# Patient Record
Sex: Female | Born: 1960 | Race: White | Hispanic: No | Smoking: Former smoker
Health system: Southern US, Community
[De-identification: ages and names within clinical notes are randomized; demographics above are authoritative.]

## PROBLEM LIST (undated history)

## (undated) DIAGNOSIS — C801 Malignant (primary) neoplasm, unspecified: Secondary | ICD-10-CM

## (undated) DIAGNOSIS — C4491 Basal cell carcinoma of skin, unspecified: Secondary | ICD-10-CM

## (undated) DIAGNOSIS — J45909 Unspecified asthma, uncomplicated: Secondary | ICD-10-CM

## (undated) DIAGNOSIS — K589 Irritable bowel syndrome without diarrhea: Secondary | ICD-10-CM

## (undated) DIAGNOSIS — T8859XA Other complications of anesthesia, initial encounter: Secondary | ICD-10-CM

## (undated) DIAGNOSIS — M797 Fibromyalgia: Secondary | ICD-10-CM

## (undated) DIAGNOSIS — R011 Cardiac murmur, unspecified: Secondary | ICD-10-CM

## (undated) DIAGNOSIS — K635 Polyp of colon: Secondary | ICD-10-CM

## (undated) DIAGNOSIS — M1990 Unspecified osteoarthritis, unspecified site: Secondary | ICD-10-CM

## (undated) DIAGNOSIS — G629 Polyneuropathy, unspecified: Secondary | ICD-10-CM

## (undated) DIAGNOSIS — T4145XA Adverse effect of unspecified anesthetic, initial encounter: Secondary | ICD-10-CM

## (undated) DIAGNOSIS — H9192 Unspecified hearing loss, left ear: Secondary | ICD-10-CM

## (undated) DIAGNOSIS — C8590 Non-Hodgkin lymphoma, unspecified, unspecified site: Secondary | ICD-10-CM

## (undated) DIAGNOSIS — D472 Monoclonal gammopathy: Secondary | ICD-10-CM

## (undated) DIAGNOSIS — M8580 Other specified disorders of bone density and structure, unspecified site: Secondary | ICD-10-CM

## (undated) DIAGNOSIS — B977 Papillomavirus as the cause of diseases classified elsewhere: Secondary | ICD-10-CM

## (undated) DIAGNOSIS — G43909 Migraine, unspecified, not intractable, without status migrainosus: Secondary | ICD-10-CM

## (undated) DIAGNOSIS — D649 Anemia, unspecified: Secondary | ICD-10-CM

## (undated) DIAGNOSIS — R896 Abnormal cytological findings in specimens from other organs, systems and tissues: Secondary | ICD-10-CM

## (undated) DIAGNOSIS — F4310 Post-traumatic stress disorder, unspecified: Secondary | ICD-10-CM

## (undated) DIAGNOSIS — K219 Gastro-esophageal reflux disease without esophagitis: Secondary | ICD-10-CM

## (undated) DIAGNOSIS — G4700 Insomnia, unspecified: Secondary | ICD-10-CM

## (undated) DIAGNOSIS — F32A Depression, unspecified: Secondary | ICD-10-CM

## (undated) DIAGNOSIS — K649 Unspecified hemorrhoids: Secondary | ICD-10-CM

## (undated) DIAGNOSIS — F3181 Bipolar II disorder: Secondary | ICD-10-CM

## (undated) DIAGNOSIS — F419 Anxiety disorder, unspecified: Secondary | ICD-10-CM

## (undated) DIAGNOSIS — E7800 Pure hypercholesterolemia, unspecified: Secondary | ICD-10-CM

## (undated) DIAGNOSIS — K52832 Lymphocytic colitis: Secondary | ICD-10-CM

## (undated) DIAGNOSIS — F329 Major depressive disorder, single episode, unspecified: Secondary | ICD-10-CM

## (undated) HISTORY — DX: Gastro-esophageal reflux disease without esophagitis: K21.9

## (undated) HISTORY — DX: Insomnia, unspecified: G47.00

## (undated) HISTORY — DX: Anemia, unspecified: D64.9

## (undated) HISTORY — DX: Monoclonal gammopathy: D47.2

## (undated) HISTORY — DX: Lymphocytic colitis: K52.832

## (undated) HISTORY — DX: Non-Hodgkin lymphoma, unspecified, unspecified site: C85.90

## (undated) HISTORY — DX: Migraine, unspecified, not intractable, without status migrainosus: G43.909

## (undated) HISTORY — PX: COLONOSCOPY: SHX174

## (undated) HISTORY — PX: BREAST BIOPSY: SHX20

## (undated) HISTORY — DX: Anxiety disorder, unspecified: F41.9

## (undated) HISTORY — DX: Abnormal cytological findings in specimens from other organs, systems and tissues: R89.6

## (undated) HISTORY — DX: Unspecified osteoarthritis, unspecified site: M19.90

## (undated) HISTORY — DX: Fibromyalgia: M79.7

## (undated) HISTORY — DX: Basal cell carcinoma of skin, unspecified: C44.91

## (undated) HISTORY — DX: Other specified disorders of bone density and structure, unspecified site: M85.80

## (undated) HISTORY — PX: PELVIC LAPAROSCOPY: SHX162

## (undated) HISTORY — DX: Irritable bowel syndrome, unspecified: K58.9

## (undated) HISTORY — PX: ESOPHAGOGASTRODUODENOSCOPY: SHX1529

## (undated) HISTORY — DX: Papillomavirus as the cause of diseases classified elsewhere: B97.7

## (undated) HISTORY — DX: Pure hypercholesterolemia, unspecified: E78.00

## (undated) HISTORY — DX: Polyneuropathy, unspecified: G62.9

## (undated) HISTORY — DX: Unspecified hearing loss, left ear: H91.92

## (undated) HISTORY — DX: Polyp of colon: K63.5

## (undated) HISTORY — DX: Post-traumatic stress disorder, unspecified: F43.10

## (undated) HISTORY — DX: Bipolar II disorder: F31.81

## (undated) HISTORY — PX: DILATION AND CURETTAGE OF UTERUS: SHX78

## (undated) HISTORY — PX: SHOULDER SURGERY: SHX246

---

## 1991-02-28 HISTORY — PX: HEMORRHOID SURGERY: SHX153

## 1998-06-28 ENCOUNTER — Other Ambulatory Visit: Admission: RE | Admit: 1998-06-28 | Discharge: 1998-06-28 | Payer: Self-pay | Admitting: Obstetrics and Gynecology

## 1999-07-05 ENCOUNTER — Other Ambulatory Visit: Admission: RE | Admit: 1999-07-05 | Discharge: 1999-07-05 | Payer: Self-pay | Admitting: Gynecology

## 2000-07-06 ENCOUNTER — Other Ambulatory Visit: Admission: RE | Admit: 2000-07-06 | Discharge: 2000-07-06 | Payer: Self-pay | Admitting: Gynecology

## 2000-09-21 ENCOUNTER — Emergency Department (HOSPITAL_COMMUNITY): Admission: EM | Admit: 2000-09-21 | Discharge: 2000-09-21 | Payer: Self-pay | Admitting: Emergency Medicine

## 2000-09-21 ENCOUNTER — Encounter: Payer: Self-pay | Admitting: Emergency Medicine

## 2001-01-11 ENCOUNTER — Encounter: Payer: Self-pay | Admitting: Gastroenterology

## 2001-01-11 ENCOUNTER — Encounter: Admission: RE | Admit: 2001-01-11 | Discharge: 2001-01-11 | Payer: Self-pay | Admitting: Gastroenterology

## 2001-01-14 ENCOUNTER — Encounter: Admission: RE | Admit: 2001-01-14 | Discharge: 2001-01-14 | Payer: Self-pay | Admitting: Gastroenterology

## 2001-01-14 ENCOUNTER — Encounter: Payer: Self-pay | Admitting: Gastroenterology

## 2001-07-23 ENCOUNTER — Other Ambulatory Visit: Admission: RE | Admit: 2001-07-23 | Discharge: 2001-07-23 | Payer: Self-pay | Admitting: Gynecology

## 2001-09-11 ENCOUNTER — Encounter: Admission: RE | Admit: 2001-09-11 | Discharge: 2001-09-11 | Payer: Self-pay | Admitting: Family Medicine

## 2001-09-11 ENCOUNTER — Encounter: Payer: Self-pay | Admitting: Family Medicine

## 2002-01-02 ENCOUNTER — Other Ambulatory Visit: Admission: RE | Admit: 2002-01-02 | Discharge: 2002-01-02 | Payer: Self-pay | Admitting: Gynecology

## 2002-12-23 ENCOUNTER — Other Ambulatory Visit: Admission: RE | Admit: 2002-12-23 | Discharge: 2002-12-23 | Payer: Self-pay | Admitting: Family Medicine

## 2003-01-06 ENCOUNTER — Encounter: Admission: RE | Admit: 2003-01-06 | Discharge: 2003-04-06 | Payer: Self-pay | Admitting: Family Medicine

## 2003-01-30 ENCOUNTER — Encounter: Admission: RE | Admit: 2003-01-30 | Discharge: 2003-01-30 | Payer: Self-pay | Admitting: Family Medicine

## 2003-04-05 ENCOUNTER — Encounter: Admission: RE | Admit: 2003-04-05 | Discharge: 2003-04-05 | Payer: Self-pay | Admitting: Family Medicine

## 2003-06-28 ENCOUNTER — Emergency Department (HOSPITAL_COMMUNITY): Admission: EM | Admit: 2003-06-28 | Discharge: 2003-06-28 | Payer: Self-pay | Admitting: Emergency Medicine

## 2003-08-18 ENCOUNTER — Encounter: Admission: RE | Admit: 2003-08-18 | Discharge: 2003-10-12 | Payer: Self-pay | Admitting: Family Medicine

## 2003-09-21 ENCOUNTER — Encounter: Admission: RE | Admit: 2003-09-21 | Discharge: 2003-09-21 | Payer: Self-pay | Admitting: Neurological Surgery

## 2003-12-07 ENCOUNTER — Encounter: Admission: RE | Admit: 2003-12-07 | Discharge: 2003-12-07 | Payer: Self-pay | Admitting: Neurological Surgery

## 2003-12-18 ENCOUNTER — Other Ambulatory Visit: Admission: RE | Admit: 2003-12-18 | Discharge: 2003-12-18 | Payer: Self-pay | Admitting: Gynecology

## 2004-02-02 ENCOUNTER — Encounter: Admission: RE | Admit: 2004-02-02 | Discharge: 2004-02-02 | Payer: Self-pay | Admitting: Family Medicine

## 2004-03-08 ENCOUNTER — Encounter: Admission: RE | Admit: 2004-03-08 | Discharge: 2004-03-08 | Payer: Self-pay | Admitting: Neurological Surgery

## 2004-04-09 ENCOUNTER — Emergency Department (HOSPITAL_COMMUNITY): Admission: EM | Admit: 2004-04-09 | Discharge: 2004-04-09 | Payer: Self-pay | Admitting: Emergency Medicine

## 2004-04-13 ENCOUNTER — Encounter: Admission: RE | Admit: 2004-04-13 | Discharge: 2004-04-13 | Payer: Self-pay | Admitting: Neurological Surgery

## 2005-03-26 ENCOUNTER — Encounter: Admission: RE | Admit: 2005-03-26 | Discharge: 2005-03-26 | Payer: Self-pay | Admitting: Orthopedic Surgery

## 2005-04-13 ENCOUNTER — Encounter: Admission: RE | Admit: 2005-04-13 | Discharge: 2005-07-12 | Payer: Self-pay | Admitting: Orthopedic Surgery

## 2005-05-05 ENCOUNTER — Other Ambulatory Visit: Admission: RE | Admit: 2005-05-05 | Discharge: 2005-05-05 | Payer: Self-pay | Admitting: Family Medicine

## 2005-05-06 DIAGNOSIS — IMO0001 Reserved for inherently not codable concepts without codable children: Secondary | ICD-10-CM

## 2005-05-06 HISTORY — DX: Reserved for inherently not codable concepts without codable children: IMO0001

## 2005-07-13 ENCOUNTER — Encounter: Admission: RE | Admit: 2005-07-13 | Discharge: 2005-08-03 | Payer: Self-pay | Admitting: Orthopedic Surgery

## 2005-11-10 ENCOUNTER — Encounter: Admission: RE | Admit: 2005-11-10 | Discharge: 2005-11-10 | Payer: Self-pay | Admitting: Family Medicine

## 2005-11-29 ENCOUNTER — Other Ambulatory Visit: Admission: RE | Admit: 2005-11-29 | Discharge: 2005-11-29 | Payer: Self-pay | Admitting: Family Medicine

## 2006-02-20 ENCOUNTER — Emergency Department (HOSPITAL_COMMUNITY): Admission: EM | Admit: 2006-02-20 | Discharge: 2006-02-20 | Payer: Self-pay | Admitting: Emergency Medicine

## 2006-09-11 ENCOUNTER — Encounter: Admission: RE | Admit: 2006-09-11 | Discharge: 2006-09-11 | Payer: Self-pay | Admitting: Family Medicine

## 2006-10-03 ENCOUNTER — Other Ambulatory Visit: Admission: RE | Admit: 2006-10-03 | Discharge: 2006-10-03 | Payer: Self-pay | Admitting: Gynecology

## 2006-11-13 ENCOUNTER — Encounter: Admission: RE | Admit: 2006-11-13 | Discharge: 2006-11-13 | Payer: Self-pay | Admitting: Family Medicine

## 2006-11-15 ENCOUNTER — Encounter: Admission: RE | Admit: 2006-11-15 | Discharge: 2006-11-15 | Payer: Self-pay | Admitting: Family Medicine

## 2007-06-11 ENCOUNTER — Encounter: Admission: RE | Admit: 2007-06-11 | Discharge: 2007-06-11 | Payer: Self-pay | Admitting: Family Medicine

## 2007-08-02 ENCOUNTER — Encounter: Admission: RE | Admit: 2007-08-02 | Discharge: 2007-08-02 | Payer: Self-pay | Admitting: Family Medicine

## 2007-08-28 ENCOUNTER — Encounter: Admission: RE | Admit: 2007-08-28 | Discharge: 2007-10-16 | Payer: Self-pay | Admitting: Neurological Surgery

## 2007-09-23 ENCOUNTER — Encounter: Admission: RE | Admit: 2007-09-23 | Discharge: 2007-09-23 | Payer: Self-pay | Admitting: Neurological Surgery

## 2008-02-28 HISTORY — PX: SPINE SURGERY: SHX786

## 2008-03-11 ENCOUNTER — Other Ambulatory Visit: Admission: RE | Admit: 2008-03-11 | Discharge: 2008-03-11 | Payer: Self-pay | Admitting: Family Medicine

## 2008-04-02 ENCOUNTER — Emergency Department (HOSPITAL_COMMUNITY): Admission: EM | Admit: 2008-04-02 | Discharge: 2008-04-02 | Payer: Self-pay | Admitting: Emergency Medicine

## 2008-05-14 ENCOUNTER — Ambulatory Visit: Payer: Self-pay | Admitting: Gynecology

## 2008-06-11 ENCOUNTER — Ambulatory Visit (HOSPITAL_COMMUNITY): Admission: RE | Admit: 2008-06-11 | Discharge: 2008-06-11 | Payer: Self-pay | Admitting: Gastroenterology

## 2008-08-20 ENCOUNTER — Emergency Department (HOSPITAL_COMMUNITY): Admission: EM | Admit: 2008-08-20 | Discharge: 2008-08-20 | Payer: Self-pay | Admitting: Emergency Medicine

## 2008-10-15 ENCOUNTER — Ambulatory Visit: Payer: Self-pay | Admitting: Gynecology

## 2008-10-19 ENCOUNTER — Ambulatory Visit: Payer: Self-pay | Admitting: Gynecology

## 2008-10-19 ENCOUNTER — Encounter: Payer: Self-pay | Admitting: Gynecology

## 2008-12-08 ENCOUNTER — Emergency Department (HOSPITAL_COMMUNITY): Admission: EM | Admit: 2008-12-08 | Discharge: 2008-12-08 | Payer: Self-pay | Admitting: Emergency Medicine

## 2008-12-17 ENCOUNTER — Encounter: Admission: RE | Admit: 2008-12-17 | Discharge: 2008-12-17 | Payer: Self-pay | Admitting: Neurological Surgery

## 2008-12-25 ENCOUNTER — Ambulatory Visit (HOSPITAL_COMMUNITY): Admission: RE | Admit: 2008-12-25 | Discharge: 2008-12-26 | Payer: Self-pay | Admitting: Neurological Surgery

## 2009-01-19 ENCOUNTER — Encounter: Admission: RE | Admit: 2009-01-19 | Discharge: 2009-01-19 | Payer: Self-pay | Admitting: Neurological Surgery

## 2009-03-10 ENCOUNTER — Ambulatory Visit: Payer: Self-pay | Admitting: Gynecology

## 2009-03-13 ENCOUNTER — Emergency Department (HOSPITAL_COMMUNITY): Admission: EM | Admit: 2009-03-13 | Discharge: 2009-03-14 | Payer: Self-pay | Admitting: Emergency Medicine

## 2009-03-22 ENCOUNTER — Encounter: Admission: RE | Admit: 2009-03-22 | Discharge: 2009-03-22 | Payer: Self-pay | Admitting: Neurological Surgery

## 2009-06-07 ENCOUNTER — Ambulatory Visit: Payer: Self-pay | Admitting: Gynecology

## 2009-06-07 ENCOUNTER — Other Ambulatory Visit: Admission: RE | Admit: 2009-06-07 | Discharge: 2009-06-07 | Payer: Self-pay | Admitting: Gynecology

## 2009-06-21 ENCOUNTER — Encounter: Admission: RE | Admit: 2009-06-21 | Discharge: 2009-06-21 | Payer: Self-pay | Admitting: Neurological Surgery

## 2009-10-05 ENCOUNTER — Encounter: Admission: RE | Admit: 2009-10-05 | Discharge: 2009-10-05 | Payer: Self-pay | Admitting: Neurological Surgery

## 2009-10-20 ENCOUNTER — Encounter: Admission: RE | Admit: 2009-10-20 | Discharge: 2009-10-20 | Payer: Self-pay | Admitting: Orthopedic Surgery

## 2009-10-26 ENCOUNTER — Ambulatory Visit (HOSPITAL_COMMUNITY): Admission: RE | Admit: 2009-10-26 | Discharge: 2009-10-26 | Payer: Self-pay | Admitting: Orthopedic Surgery

## 2009-10-28 ENCOUNTER — Ambulatory Visit: Payer: Self-pay | Admitting: Gynecology

## 2010-01-04 ENCOUNTER — Ambulatory Visit: Payer: Self-pay | Admitting: Gynecology

## 2010-02-01 ENCOUNTER — Ambulatory Visit: Payer: Self-pay | Admitting: Gynecology

## 2010-03-17 ENCOUNTER — Emergency Department (HOSPITAL_COMMUNITY)
Admission: EM | Admit: 2010-03-17 | Discharge: 2010-03-17 | Payer: Self-pay | Source: Home / Self Care | Admitting: Emergency Medicine

## 2010-03-21 ENCOUNTER — Encounter: Payer: Self-pay | Admitting: Gynecology

## 2010-05-14 LAB — URINALYSIS, ROUTINE W REFLEX MICROSCOPIC
Bilirubin Urine: NEGATIVE
Glucose, UA: NEGATIVE mg/dL
Ketones, ur: NEGATIVE mg/dL
Nitrite: POSITIVE — AB
Protein, ur: NEGATIVE mg/dL
Specific Gravity, Urine: 1.01 (ref 1.005–1.030)
Urobilinogen, UA: 1 mg/dL (ref 0.0–1.0)
pH: 6.5 (ref 5.0–8.0)

## 2010-05-14 LAB — URINE MICROSCOPIC-ADD ON

## 2010-05-14 LAB — PREGNANCY, URINE: Preg Test, Ur: NEGATIVE

## 2010-06-02 LAB — CBC
HCT: 36.3 % (ref 36.0–46.0)
Hemoglobin: 12.2 g/dL (ref 12.0–15.0)
MCHC: 33.7 g/dL (ref 30.0–36.0)
MCV: 88.1 fL (ref 78.0–100.0)
Platelets: 283 10*3/uL (ref 150–400)
RBC: 4.13 MIL/uL (ref 3.87–5.11)
RDW: 15 % (ref 11.5–15.5)
WBC: 4.8 10*3/uL (ref 4.0–10.5)

## 2010-06-02 LAB — COMPREHENSIVE METABOLIC PANEL
ALT: 27 U/L (ref 0–35)
AST: 21 U/L (ref 0–37)
Albumin: 4.4 g/dL (ref 3.5–5.2)
Alkaline Phosphatase: 38 U/L — ABNORMAL LOW (ref 39–117)
BUN: 11 mg/dL (ref 6–23)
CO2: 26 mEq/L (ref 19–32)
Calcium: 8.9 mg/dL (ref 8.4–10.5)
Chloride: 105 mEq/L (ref 96–112)
Creatinine, Ser: 0.68 mg/dL (ref 0.4–1.2)
GFR calc Af Amer: >60 mL/min (ref >60)
GFR calc non Af Amer: >60 mL/min (ref >60)
Glucose, Bld: 106 mg/dL — ABNORMAL HIGH (ref 70–99)
Potassium: 3.7 mEq/L (ref 3.5–5.1)
Sodium: 138 mEq/L (ref 135–145)
Total Bilirubin: 0.8 mg/dL (ref 0.3–1.2)
Total Protein: 7 g/dL (ref 6.0–8.3)

## 2010-06-02 LAB — PROTIME-INR
INR: 1.09 (ref 0.00–1.49)
Prothrombin Time: 14 seconds (ref 11.6–15.2)

## 2010-06-02 LAB — HCG, SERUM, QUALITATIVE: Preg, Serum: NEGATIVE

## 2010-06-02 LAB — APTT: aPTT: 28 seconds (ref 24–37)

## 2010-08-04 ENCOUNTER — Ambulatory Visit: Payer: Medicare Other | Attending: Neurological Surgery

## 2010-08-04 DIAGNOSIS — IMO0001 Reserved for inherently not codable concepts without codable children: Secondary | ICD-10-CM | POA: Insufficient documentation

## 2010-08-04 DIAGNOSIS — M25519 Pain in unspecified shoulder: Secondary | ICD-10-CM | POA: Insufficient documentation

## 2010-08-04 DIAGNOSIS — R5381 Other malaise: Secondary | ICD-10-CM | POA: Insufficient documentation

## 2010-08-04 DIAGNOSIS — M25619 Stiffness of unspecified shoulder, not elsewhere classified: Secondary | ICD-10-CM | POA: Insufficient documentation

## 2010-08-04 DIAGNOSIS — M542 Cervicalgia: Secondary | ICD-10-CM | POA: Insufficient documentation

## 2010-08-08 ENCOUNTER — Ambulatory Visit: Payer: Medicare Other | Admitting: Physical Therapy

## 2010-08-10 ENCOUNTER — Ambulatory Visit: Payer: Medicare Other

## 2010-08-16 ENCOUNTER — Ambulatory Visit: Payer: Medicare Other

## 2010-08-18 ENCOUNTER — Ambulatory Visit: Payer: Medicare Other

## 2010-08-23 ENCOUNTER — Ambulatory Visit: Payer: Medicare Other

## 2010-08-25 ENCOUNTER — Ambulatory Visit: Payer: Medicare Other

## 2010-08-30 ENCOUNTER — Ambulatory Visit: Payer: Medicare Other | Attending: Neurological Surgery | Admitting: Physical Therapy

## 2010-08-30 DIAGNOSIS — M25619 Stiffness of unspecified shoulder, not elsewhere classified: Secondary | ICD-10-CM | POA: Insufficient documentation

## 2010-08-30 DIAGNOSIS — R5381 Other malaise: Secondary | ICD-10-CM | POA: Insufficient documentation

## 2010-08-30 DIAGNOSIS — IMO0001 Reserved for inherently not codable concepts without codable children: Secondary | ICD-10-CM | POA: Insufficient documentation

## 2010-08-30 DIAGNOSIS — M25519 Pain in unspecified shoulder: Secondary | ICD-10-CM | POA: Insufficient documentation

## 2010-08-30 DIAGNOSIS — M542 Cervicalgia: Secondary | ICD-10-CM | POA: Insufficient documentation

## 2010-09-01 ENCOUNTER — Ambulatory Visit: Payer: Medicare Other

## 2010-09-02 ENCOUNTER — Other Ambulatory Visit: Payer: Self-pay | Admitting: Gastroenterology

## 2010-09-06 ENCOUNTER — Encounter: Payer: Medicare Other | Admitting: Physical Therapy

## 2010-09-08 ENCOUNTER — Ambulatory Visit: Payer: Medicare Other | Admitting: Physical Therapy

## 2010-09-12 ENCOUNTER — Ambulatory Visit: Payer: Medicare Other

## 2010-09-14 ENCOUNTER — Ambulatory Visit: Payer: Medicare Other | Admitting: Physical Therapy

## 2010-09-21 ENCOUNTER — Encounter: Payer: Medicare Other | Admitting: Physical Therapy

## 2010-10-05 ENCOUNTER — Encounter: Payer: Medicare Other | Admitting: Physical Therapy

## 2010-11-11 ENCOUNTER — Ambulatory Visit
Admission: RE | Admit: 2010-11-11 | Discharge: 2010-11-11 | Disposition: A | Discharge status: Home / Self Care | Payer: Medicare Other | Source: Ambulatory Visit | Attending: Gastroenterology | Admitting: Gastroenterology

## 2010-11-11 ENCOUNTER — Other Ambulatory Visit: Payer: Self-pay | Admitting: Gastroenterology

## 2010-11-11 DIAGNOSIS — R1011 Right upper quadrant pain: Secondary | ICD-10-CM

## 2010-11-11 MED ORDER — IOHEXOL 300 MG/ML  SOLN
100.0000 mL | Freq: Once | INTRAMUSCULAR | Status: AC | PRN
  Administered 2010-11-11: 100 mL via INTRAVENOUS

## 2010-11-29 ENCOUNTER — Encounter (INDEPENDENT_AMBULATORY_CARE_PROVIDER_SITE_OTHER): Payer: Self-pay | Admitting: Surgery

## 2010-11-29 ENCOUNTER — Ambulatory Visit (INDEPENDENT_AMBULATORY_CARE_PROVIDER_SITE_OTHER): Payer: Medicare Other | Admitting: Surgery

## 2010-11-29 VITALS — BP 102/68 | HR 80 | Temp 97.4°F | Resp 20 | Ht 62.0 in | Wt 102.4 lb

## 2010-11-29 DIAGNOSIS — K649 Unspecified hemorrhoids: Secondary | ICD-10-CM | POA: Insufficient documentation

## 2010-11-29 DIAGNOSIS — R1012 Left upper quadrant pain: Secondary | ICD-10-CM

## 2010-11-29 NOTE — Patient Instructions (Signed)
HEMORRHOIDS   The rectum is the last few inches of your colon, and it naturally stretches to hold stool.  Hemorrhoidal piles are natural clusters of blood vessels that help the rectum stretch to hold stool and allow bowel movements to eliminate feces.  Hemorrhoids are abnormally swollen blood vessels in the rectum.  Too much pressure in the rectum causes hemorrhoids by forcing blood to stretch and bulge the walls of the veins, sometimes even rupturing them.  Hemorrhoids can become like varicose veins you might see on a person's legs. When bulging hemorrhoidal veins are irritated, they can swell, burn, itch, become very painful, and bleed. Once the rectal veins have been stretched out and hemorrhoids created, they are difficult to get rid of completely and tend to recur with less straining than it took to cause them in the first place. Fortunately, good habits and simple medical treatment usually control hemorrhoids well, and surgery is only recommended in unusually severe cases. Some of the most frequent causes of hemorrhoids:    Constant sitting    Straining with bowel movements (from constipation or hard stools)    Diarrhea    Sitting on the toilet for a long time    Severe coughing    Childbirth    Heavy Lifting  Types of Hemorrhoids:    Internal hemorrhoids usually don't hurt or itch; they are deep inside the rectum and usually have no sensation. However, internal hemorrhoids can bleed.  Such bleeding should not be ignored and mask blood from a dangerous source like colorectal cancer, so persistent rectal bleeding should be investigated with a colonoscopy.    External hemorrhoids cause most of the symptoms - pain, burning, and itching. Unirritated hemorrhoids can look like small skin tags coming out of the anus.     Thrombosed hemorrhoids can form when a hemorrhoid blood vessel bursts and causes the hemorrhoid to swell.  A purple blood clot can form in it and become an excruciatingly painful lump  at the anus. Because of these unpleasant symptoms, immediate incision and drainage by a surgeon at an office visit can provide much relief of the pain.    PREVENTION Avoiding the causes listed in above will prevent most cases of hemorrhoids, but this advice is sometimes hard to follow:  How can you avoid sitting all day if you have a seated job? Also, we try to avoid coughing and diarrhea, but sometimes it's beyond your control.  Still, there are some practical hints to help:    If your main job activity is seated, always stand or walk during your breaks. Make it a point to stand and walk at least 5 minutes every hour and try to shift frequently in your chair to avoid direct rectal pressure.    Always exhale as you strain or lift. Don't hold your breath.    Treat coughing, diarrhea and constipation early since irritated hemorrhoids may soon follow.    Do not delay or try to prevent a bowel movement when the urge is present.   Exercise regularly (walking or jogging 60 minutes a day) to stimulate the bowels to move.   Avoid dry toilet paper when cleaning after bowel movements.  Moistened tissues such as baby wipes are less irritating.  Lightly pat the rectal area dry.  Using irrigating showers or bottle irrigation washing can more gently clean this sensitive area.   Keep the anal and genital area clean and  dry.  Talcum or baby powders can help   GET   YOUR STOOLS SOFT.   This is the most important way to prevent irritated hemorrhoids.  Hard stools are like sandpaper to the anorectal canal and will cause more problems.   The goal: ONE SOFT BOWEL MOVEMENT A DAY!  To have soft, regular bowel movements:    Drink at least 8 tall glasses of water a day.     AVOID CONSTIPATION    Take plenty of fiber.  Fiber is the undigested part of plant food that passes into the colon, acting s "natures broom" to encourage bowel motility and movement.  Fiber can absorb and hold large amounts of water. This results in a  larger, bulkier stool, which is soft and easier to pass. Work gradually over several weeks up to 6 servings a day of fiber (25g a day even more if needed) in the form of: o Vegetables -- Root (potatoes, carrots, turnips), leafy green (lettuce, salad greens, celery, spinach), or cooked high residue (cabbage, broccoli, etc) o Fruit -- Fresh (unpeeled skin & pulp), Dried (prunes, apricots, cherries, etc ),  or stewed ( applesauce)  o Whole grain breads, pasta, etc (whole wheat)  o Bran cereals    Bulking Agents -- This type of water-retaining fiber generally is easily obtained each day by one of the following:  o Psyllium bran -- The psyllium plant is remarkable because its ground seeds can retain so much water. This product is available as Metamucil, Konsyl, Effersyllium, Per Diem Fiber, or the less expensive generic preparation in drug and health food stores. Although labeled a laxative, it really is not a laxative.  o Methylcellulose -- This is another fiber derived from wood which also retains water. It is available as Citrucel. o Polyethylene Glycol - and "artificial" fiber commonly called Miralax or Glycolax.  It is helpful for people with gassy or bloated feelings with regular fiber o Flax Seed - a less gassy fiber than psyllium   No reading or other relaxing activity while on the toilet. If bowel movements take longer than 5 minutes, you are too constipated   Laxatives can be useful for a short period if constipation is severe o Osmotics (Milk of Magnesia, Fleets phosphosoda, Magnesium citrate, MiraLax, GoLytely) are safer than  o Stimulants (Senokot, Castor Oil, Dulcolax, Ex Lax)    o Do not take laxatives for more than 7days in a row.   Laxatives are not a good long-term solution as it can stress the intestine and colon and causes too much mineral and fluid losses.    If badly constipated, try a Bowel Retraining Program: o Do not use laxatives.  o Eat a diet high in roughage, such as bran  cereals and leafy vegetables.  o Drink six (6) ounces of prune or apricot juice each morning.  o Eat two (2) large servings of stewed fruit each day.  o Take one (1) heaping dose of a bulking agent (ex. Metamucil, Citrucel, Miralax) twice a day.  o Use sugar-free sweetener when possible to avoid excessive calories.  o Eat a normal breakfast.  o Set aside 15 minutes after breakfast to sit on the toilet, but do not strain to have a bowel movement.  o If you do not have a bowel movement by the third day, use an enema and repeat the above steps.    AVOID DIARRHEA o Switch to liquids and simpler foods for a few days to avoid stressing your intestines further. o Avoid dairy products (especially milk & ice cream) for a short   time.  The intestines often can lose the ability to digest lactose when stressed. o Avoid foods that cause gassiness or bloating.  Typical foods include beans and other legumes, cabbage, broccoli, and dairy foods.  Every person has some sensitivity to other foods, so listen to our body and avoid those foods that trigger problems for you. o Adding fiber (Citrucel, Metamucil, psyllium, Miralax) gradually can help thicken stools by absorbing excess fluid and retrain the intestines to act more normally.  Slowly increase the dose over a few weeks.  Too much fiber too soon can backfire and cause cramping & bloating. o Probiotics (such as active yogurt, Align, etc) may help repopulate the intestines and colon with normal bacteria and calm down a sensitive digestive tract.  Most studies show it to be of mild help, though, and such products can be costly. o Medicines:   Bismuth subsalicylate (ex. Kayopectate, Pepto Bismol) every 30 minutes for up to 6 doses can help control diarrhea.  Avoid if pregnant.   Loperamide (Immodium) can slow down diarrhea.  Start with two tablets (4mg total) first and then try one tablet every 6 hours.  Avoid if you are having fevers or severe pain.  If you are not  better or start feeling worse, stop all medicines and call your doctor for advice o Call your doctor if you are getting worse or not better.  Sometimes further testing (cultures, endoscopy, X-ray studies, bloodwork, etc) may be needed to help diagnose and treat the cause of the diarrhea.   If these preventive measures fail, you must take action right away! Hemorrhoids are one condition that can be mild in the morning and become intolerable by nightfall.      

## 2010-11-30 ENCOUNTER — Encounter (INDEPENDENT_AMBULATORY_CARE_PROVIDER_SITE_OTHER): Payer: Self-pay | Admitting: Surgery

## 2010-11-30 NOTE — Progress Notes (Signed)
Subjective:     Patient ID: Danielle Bruce, female   DOB: [birth year], 50 y.o.   MRN: [id number]  HPI  Patient Care Team: Lupita Raider as PCP - General Shirley Friar, MD as Consulting Physician (Gastroenterology) Florencia Reasons, MD as Consulting Physician (Gastroenterology)  This patient is a 50 y.o.female who presents today for surgical evaluation.   Patient is someone who struggles with irritable bowel syndrome with intermittent constipation. She has had hemorrhoid problems in the past. She claims to have had a hemorrhoidectomy numerous times in the 1990s. Had a colonoscopy July 2012 with removal of 2 polyps.  She notes she's been having rectal discomfort. It's been going on for the past week. She tried a rectal suppository. It seemed to help a little bit but she ran out. No severe pain with bowel movements but burning and irritation afterwards. She claims that she gets pain with a bowel movement so she's been avoiding eating. She claims she can barely eats 600 calories. She is drinking 2 supplemental shakes a day.  She is followed by [facility] gastroenterology & was seen by them today. They were concerned for a possible thrombosed hemorrhoid & sent the patient to [place]. They wrote a  prescription or ProctoFoam as well.   Past Medical History  Diagnosis Date  . Anemia   . GERD (gastroesophageal reflux disease)   . Weight loss, unintentional   . 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 Nasal congestion   . Abdominal distention   . Abdominal pain   . Constipation   . Diarrhea   . Nausea and vomiting   . Easy bruising   . Generalized headaches   . Migraines   . Rectal pain     Past Surgical History  Procedure Date  . Hemorrhoid surgery 1993    x3  . Cesarean section [id number]  . Shoulder surgery 2007/2008  . Spine surgery 2010    cervical    History   Social History  . Marital Status: Divorced    Spouse Name: N/A    Number of Children: N/A  . Years of Education: N/A    Occupational History  . Not on file.   Social History Main Topics  . Smoking status: Former Games developer  . Smokeless tobacco: Not on file  . Alcohol Use: No  . Drug Use: No  . Sexually Active:    Other Topics Concern  . Not on file   Social History Narrative  . No narrative on file    History reviewed. No pertinent family history.  Current outpatient prescriptions:Albuterol Sulfate (PROAIR HFA IN), Inhale into the lungs daily as needed.  , Disp: , Rfl: ;  DiphenhydrAMINE HCl (BENADRYL PO), Take by mouth as needed.  , Disp: , Rfl: ;  Epinastine HCl (ELESTAT OP), Apply to eye daily.  , Disp: , Rfl: ;  Esomeprazole Magnesium (NEXIUM PO), Take by mouth 2 (two) times daily.  , Disp: , Rfl: ;  FEXOFENADINE HCL PO, Take by mouth daily.  , Disp: , Rfl:  fish oil-omega-3 fatty acids 1000 MG capsule, Take 2 g by mouth daily.  , Disp: , Rfl: ;  GARLIC PO, Take by mouth daily.  , Disp: , Rfl: ;  Hydrocodone-Acetaminophen (VICODIN PO), Take by mouth as needed.  , Disp: , Rfl: ;  Hyoscyamine Sulfate (LEVSIN PO), Take by mouth as needed.  , Disp: , Rfl: ;  LamoTRIgine (LAMICTAL PO), Take 250 mg by mouth daily.  , Disp: ,  Rfl:  Levocetirizine Dihydrochloride (XYZAL PO), Take by mouth daily.  , Disp: , Rfl: ;  LORazepam (ATIVAN) 1 MG tablet, Take 1 mg by mouth as needed.  , Disp: , Rfl: ;  Methylphenidate (DAYTRANA TD), Place onto the skin as needed.  , Disp: , Rfl: ;  Montelukast Sodium (SINGULAIR PO), Take by mouth daily.  , Disp: , Rfl: ;  Multiple Vitamin (MULTIVITAMIN PO), Take by mouth daily.  , Disp: , Rfl:  Nutritional Supplements (MELATONIN PO), Take by mouth daily.  , Disp: , Rfl: ;  Polyethylene Glycol 3350 (MIRALAX PO), Take by mouth daily.  , Disp: , Rfl: ;  Probiotic Product (PROBIOTIC PO), Take by mouth daily.  , Disp: , Rfl: ;  Promethazine HCl (PHENERGAN PO), Take by mouth as needed.  , Disp: , Rfl: ;  Red Yeast Rice Extract (RED YEAST RICE PO), Take by mouth daily.  , Disp: , Rfl:  Sennosides  (SENOKOT PO), Take by mouth daily.  , Disp: , Rfl: ;  topiramate (TOPAMAX) 100 MG tablet, Take 100 mg by mouth daily.  , Disp: , Rfl: ;  traZODone (DESYREL) 50 MG tablet, Take 50 mg by mouth at bedtime.  , Disp: , Rfl:   Allergies  Allergen Reactions  . Sulfa Antibiotics     Causes pt to vomit blood  . Flagyl (Metronidazole Hcl)        Review of Systems  Constitutional: Negative for fever, chills, diaphoresis, appetite change and fatigue.  HENT: Positive for hearing loss and congestion. Negative for ear pain, sore throat, trouble swallowing, neck pain and ear discharge.   Eyes: Negative for photophobia, discharge and visual disturbance.  Respiratory: Negative for cough, choking, chest tightness and shortness of breath.   Cardiovascular: Negative for chest pain and palpitations.  Gastrointestinal: Positive for nausea, abdominal pain, diarrhea, constipation, abdominal distention and rectal pain. Negative for vomiting, blood in stool and anal bleeding.  Genitourinary: Negative for dysuria, frequency and difficulty urinating.  Musculoskeletal: Negative for myalgias and gait problem.  Skin: Negative for color change, pallor and rash.  Neurological: Positive for headaches. Negative for dizziness, speech difficulty, weakness and numbness.  Hematological: Negative for adenopathy. Bruises/bleeds easily.  Psychiatric/Behavioral: Negative for suicidal ideas, confusion and agitation. The patient is nervous/anxious.        Objective:   Physical Exam  Constitutional: She is oriented to person, place, and time. She appears well-developed and well-nourished. No distress.  HENT:  Head: Normocephalic.  Mouth/Throat: Oropharynx is clear and moist. No oropharyngeal exudate.  Eyes: Conjunctivae and EOM are normal. Pupils are equal, round, and reactive to light. No scleral icterus.  Neck: Normal range of motion. Neck supple. No tracheal deviation present.  Cardiovascular: Normal rate, regular rhythm  and intact distal pulses.   Pulmonary/Chest: Effort normal and breath sounds normal. No respiratory distress. She exhibits no tenderness.  Abdominal: Soft. She exhibits no distension and no mass. There is no rebound and no guarding. Hernia confirmed negative in the right inguinal area and confirmed negative in the left inguinal area.       Mild LUQ abd discomfort  Genitourinary: Vagina normal. No vaginal discharge found.       Perianal skin clean with good hygiene.  No pruritis.  Normal sphincter tone.  No fissure.  No abscess/fistula.  No pilonidal disease.    Tolerates digital and anoscopic rectal exam.  No rectal masses.    Hemorrhoidal piles WNL except R post slightly enlarged with minimal prolapse on Valsalva.  No evidence of a thrombosed hemorrhoid.  No treatment done, especially since pt very anxious against any office treatment.   Musculoskeletal: Normal range of motion. She exhibits no tenderness.  Lymphadenopathy:    She has no cervical adenopathy.       Right: No inguinal adenopathy present.       Left: No inguinal adenopathy present.  Neurological: She is alert and oriented to person, place, and time. No cranial nerve deficit. She exhibits normal muscle tone. Coordination normal.  Skin: Skin is warm and dry. No rash noted. She is not diaphoretic. No erythema.  Psychiatric: Her behavior is normal. Judgment and thought content normal.       Depressed       Assessment:     Hemorrhoids with occasional irritation.     Plan:     The anatomy & physiology of the anorectal region was discussed.  The pathophysiology of hemorrhoids and differential diagnosis was discussed.  Natural history progression  was discussed.   I stressed the importance of a bowel regimen to have daily soft bowel movements to minimize progression of disease.   Use of wet wipes, warm baths, avoiding straining, etc were emphasized.  She wondered if she could use Tucks pads. I thought that was reasonable.  I noted  that topical agents do not help hemorrhoids heal, however, it did help her feel better symptomatically than it is reasonable to continue to use them intermittently. Operative her prescription. She declined. Her mother tried her get her to take it. She still declined.  I see nothing that requires aggressive intervention such as banding or injection. She does not want anything done today anyway. She seems rather depressed. I tried to reassure her and give support. Her mother acknowledged it. I think eventually the patient did as well. At the very least, she feels reassured that there is no major problem needed to be done.  Educational handouts further explaining the pathology, treatment options, and bowel regimen were given as well.   The patient & mother expressed understanding.

## 2011-01-03 ENCOUNTER — Ambulatory Visit (INDEPENDENT_AMBULATORY_CARE_PROVIDER_SITE_OTHER): Payer: Medicare Other | Admitting: Gynecology

## 2011-01-03 ENCOUNTER — Encounter: Payer: Self-pay | Admitting: Gynecology

## 2011-01-03 VITALS — BP 110/70

## 2011-01-03 DIAGNOSIS — N938 Other specified abnormal uterine and vaginal bleeding: Secondary | ICD-10-CM

## 2011-01-03 DIAGNOSIS — T8389XA Other specified complication of genitourinary prosthetic devices, implants and grafts, initial encounter: Secondary | ICD-10-CM

## 2011-01-03 DIAGNOSIS — N925 Other specified irregular menstruation: Secondary | ICD-10-CM

## 2011-01-03 DIAGNOSIS — T839XXA Unspecified complication of genitourinary prosthetic device, implant and graft, initial encounter: Secondary | ICD-10-CM

## 2011-01-03 DIAGNOSIS — N920 Excessive and frequent menstruation with regular cycle: Secondary | ICD-10-CM

## 2011-01-03 DIAGNOSIS — N949 Unspecified condition associated with female genital organs and menstrual cycle: Secondary | ICD-10-CM

## 2011-01-03 DIAGNOSIS — R102 Pelvic and perineal pain: Secondary | ICD-10-CM

## 2011-01-03 MED ORDER — MEGESTROL ACETATE 20 MG PO TABS: 20.0000 mg | ORAL_TABLET | Freq: Every day | ORAL | Status: AC

## 2011-01-03 MED ORDER — IBUPROFEN 800 MG PO TABS: 800.0000 mg | ORAL_TABLET | Freq: Three times a day (TID) | ORAL | Status: AC | PRN

## 2011-01-03 NOTE — Progress Notes (Signed)
Patient presents in referral from her primary due to menorrhagia. She has a Mirena IUD that was placed a year ago she has been amenorrheic and started bleeding about a week ago and has continued since then. She has not been sexual active since June is not having fever chills or other symptoms. She has noticed a fair amount of weight loss and she is being evaluated for this as well as abdominal pain. She had a recent CT scan of the upper abdomen which was normal with exception of a questionable gallstone or polyp and has a follow up appointment with Dr. Matthias Hughs.  Exam Abdomen soft nontender without masses guarding rebound organomegaly Pelvic external BUS vagina normal cervix posterior difficult to visualize but after manipulation is normal with a small external os no string visualized. Bimanual uterus anteverted small firm nontender adnexa without masses or tenderness  Assessment and plan: Bleeding on IUD, pregnancy not possibility. We'll start on Megace 20 mg daily x10 days and ultrasound for IUD placement check. I did order a TSH and CBC rule out hormonal dysfunction and she'll follow up with these results also. I prescribed ibuprofen 800 mg #30 one by mouth every 8 hours when necessary pain due to her pelvic cramping she'll follow up for her lab work and ultrasound and then we'll go from there.

## 2011-01-04 ENCOUNTER — Ambulatory Visit: Payer: Medicare Other | Admitting: Gynecology

## 2011-01-04 ENCOUNTER — Ambulatory Visit (INDEPENDENT_AMBULATORY_CARE_PROVIDER_SITE_OTHER): Payer: Medicare Other

## 2011-01-04 ENCOUNTER — Ambulatory Visit (INDEPENDENT_AMBULATORY_CARE_PROVIDER_SITE_OTHER): Payer: Medicare Other | Admitting: Gynecology

## 2011-01-04 ENCOUNTER — Other Ambulatory Visit: Payer: Medicare Other

## 2011-01-04 ENCOUNTER — Encounter: Payer: Self-pay | Admitting: Gynecology

## 2011-01-04 DIAGNOSIS — N925 Other specified irregular menstruation: Secondary | ICD-10-CM

## 2011-01-04 DIAGNOSIS — N938 Other specified abnormal uterine and vaginal bleeding: Secondary | ICD-10-CM

## 2011-01-04 DIAGNOSIS — R102 Pelvic and perineal pain: Secondary | ICD-10-CM

## 2011-01-04 DIAGNOSIS — N949 Unspecified condition associated with female genital organs and menstrual cycle: Secondary | ICD-10-CM

## 2011-01-04 DIAGNOSIS — Z30431 Encounter for routine checking of intrauterine contraceptive device: Secondary | ICD-10-CM

## 2011-01-04 DIAGNOSIS — IMO0001 Reserved for inherently not codable concepts without codable children: Secondary | ICD-10-CM | POA: Insufficient documentation

## 2011-01-04 DIAGNOSIS — T839XXA Unspecified complication of genitourinary prosthetic device, implant and graft, initial encounter: Secondary | ICD-10-CM

## 2011-01-04 DIAGNOSIS — T8389XA Other specified complication of genitourinary prosthetic devices, implants and grafts, initial encounter: Secondary | ICD-10-CM

## 2011-01-04 NOTE — Progress Notes (Signed)
Patient follows up for ultrasound due to dysfunctional bleeding with IUD.  Ultrasound shows normal appearing anteverted uterus with IUD in the normal position. Endometrial echo of 5.7 mm. Right and left ovaries visualized and normal.  Assessment and plan: Dysfunctional bleeding IUD in place patient will finish her Megace and keep menstrual calendar as long as no further bleeding then we'll follow she is other episodes of bleeding she will call.

## 2011-01-05 ENCOUNTER — Telehealth: Payer: Self-pay | Admitting: *Deleted

## 2011-01-05 NOTE — Telephone Encounter (Signed)
Pt called recent lab result, results given to pt.

## 2011-02-07 ENCOUNTER — Telehealth: Payer: Self-pay | Admitting: *Deleted

## 2011-02-07 MED ORDER — ESTRADIOL 1 MG PO TABS: 1.0000 mg | ORAL_TABLET | Freq: Every day | ORAL | Status: DC

## 2011-02-07 NOTE — Telephone Encounter (Signed)
Was tried a different approach with Estrace 1 mg daily x14 to see if building up a little extra estrogen may not help. If it continues despite this recommended office visit.

## 2011-02-07 NOTE — Telephone Encounter (Signed)
Lm for patient to call

## 2011-02-07 NOTE — Telephone Encounter (Signed)
Patient states she took Megace as prescribed and after the ten days of it she did stop bleeding but then started back up again.  Having spotting to light flow.  Had dark blood now increasing to red in color.  Doesn't like taking Megace because of weight gain, but wants to know what you think she should do at this point?

## 2011-02-07 NOTE — Telephone Encounter (Signed)
Let's tried a different approach with Estrace 1 mg daily x14 to see if building up a little extra estrogen may not help. If it continues despite this recommended office visit.

## 2011-02-07 NOTE — Telephone Encounter (Signed)
Patient informed. 

## 2011-02-17 ENCOUNTER — Telehealth: Payer: Self-pay | Admitting: *Deleted

## 2011-02-17 MED ORDER — IBUPROFEN 800 MG PO TABS: 800.0000 mg | ORAL_TABLET | Freq: Three times a day (TID) | ORAL | Status: AC | PRN

## 2011-02-17 MED ORDER — MEGESTROL ACETATE 20 MG PO TABS: 20.0000 mg | ORAL_TABLET | Freq: Every day | ORAL | Status: DC

## 2011-02-17 NOTE — Telephone Encounter (Signed)
Pt called stating she never took the estrace 1 mg daily prescribed on 02/07/11 for DUB. She is very concerned about what the warning labels- depression, stomach problems, headaches and also the risk for cancer. Pt did stop bleeding for a couple of days, but bleeding has started again along with cramps. She said that she takes medication for all the warning labels already and doesn't want the estrace to effect her medication. Pt wants to know if she could have something else? She would also like a refill on 800 mg ibuprofen for her cramps. Please advise.

## 2011-02-17 NOTE — Telephone Encounter (Signed)
Pt informed with the below note, and she is willing to try the megace for 2 weeks. rx sent to pharmacy

## 2011-02-17 NOTE — Telephone Encounter (Signed)
I think the Estrace 1 mg daily would be okay but if she really is opposed we could retry the Megace 20 mg daily for 2 weeks. I prescribed the ibuprofen for her but if she wantsto try the Megace please put this in for her.

## 2011-02-23 ENCOUNTER — Emergency Department (HOSPITAL_COMMUNITY): Payer: No Typology Code available for payment source

## 2011-02-23 ENCOUNTER — Encounter (HOSPITAL_COMMUNITY): Payer: Self-pay | Admitting: *Deleted

## 2011-02-23 ENCOUNTER — Emergency Department (HOSPITAL_COMMUNITY)
Admission: EM | Admit: 2011-02-23 | Discharge: 2011-02-23 | Disposition: A | Discharge status: Home / Self Care | Payer: No Typology Code available for payment source | Attending: Emergency Medicine | Admitting: Emergency Medicine

## 2011-02-23 DIAGNOSIS — S161XXA Strain of muscle, fascia and tendon at neck level, initial encounter: Secondary | ICD-10-CM

## 2011-02-23 DIAGNOSIS — M542 Cervicalgia: Secondary | ICD-10-CM | POA: Insufficient documentation

## 2011-02-23 DIAGNOSIS — M546 Pain in thoracic spine: Secondary | ICD-10-CM | POA: Insufficient documentation

## 2011-02-23 DIAGNOSIS — K219 Gastro-esophageal reflux disease without esophagitis: Secondary | ICD-10-CM | POA: Insufficient documentation

## 2011-02-23 DIAGNOSIS — F3189 Other bipolar disorder: Secondary | ICD-10-CM | POA: Insufficient documentation

## 2011-02-23 DIAGNOSIS — S139XXA Sprain of joints and ligaments of unspecified parts of neck, initial encounter: Secondary | ICD-10-CM | POA: Insufficient documentation

## 2011-02-23 DIAGNOSIS — Z79899 Other long term (current) drug therapy: Secondary | ICD-10-CM | POA: Insufficient documentation

## 2011-02-23 MED ORDER — DEXAMETHASONE SODIUM PHOSPHATE 10 MG/ML IJ SOLN
10.0000 mg | Freq: Once | INTRAMUSCULAR | Status: AC
  Administered 2011-02-23: 10 mg via INTRAMUSCULAR
  Filled 2011-02-23: qty 1

## 2011-02-23 MED ORDER — IBUPROFEN 800 MG PO TABS: 800.0000 mg | ORAL_TABLET | Freq: Three times a day (TID) | ORAL | Status: AC

## 2011-02-23 MED ORDER — METHOCARBAMOL 500 MG PO TABS: 500.0000 mg | ORAL_TABLET | Freq: Two times a day (BID) | ORAL | Status: AC

## 2011-02-23 MED ORDER — ONDANSETRON 4 MG PO TBDP
4.0000 mg | ORAL_TABLET | Freq: Once | ORAL | Status: AC
  Administered 2011-02-23: 4 mg via ORAL
  Filled 2011-02-23: qty 1

## 2011-02-23 MED ORDER — HYDROCODONE-ACETAMINOPHEN 5-325 MG PO TABS: 1.0000 | ORAL_TABLET | Freq: Four times a day (QID) | ORAL | Status: AC | PRN

## 2011-02-23 MED ORDER — HYDROMORPHONE HCL PF 2 MG/ML IJ SOLN
1.0000 mg | Freq: Once | INTRAMUSCULAR | Status: AC
  Administered 2011-02-23: 1 mg via INTRAMUSCULAR
  Filled 2011-02-23: qty 1
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
## 2011-02-24 NOTE — ED Provider Notes (Signed)
Medical screening examination/treatment/procedure(s) were performed by non-physician practitioner and as supervising physician I was immediately available for consultation/collaboration.   Glynn Octave, MD 02/24/11 [phone]

## 2011-03-07 DIAGNOSIS — M542 Cervicalgia: Secondary | ICD-10-CM | POA: Diagnosis not present

## 2011-03-08 ENCOUNTER — Ambulatory Visit: Payer: No Typology Code available for payment source | Attending: Family Medicine

## 2011-03-08 DIAGNOSIS — M25519 Pain in unspecified shoulder: Secondary | ICD-10-CM | POA: Insufficient documentation

## 2011-03-08 DIAGNOSIS — M542 Cervicalgia: Secondary | ICD-10-CM | POA: Insufficient documentation

## 2011-03-08 DIAGNOSIS — R5381 Other malaise: Secondary | ICD-10-CM | POA: Insufficient documentation

## 2011-03-08 DIAGNOSIS — IMO0001 Reserved for inherently not codable concepts without codable children: Secondary | ICD-10-CM | POA: Insufficient documentation

## 2011-03-09 DIAGNOSIS — E782 Mixed hyperlipidemia: Secondary | ICD-10-CM | POA: Diagnosis not present

## 2011-03-09 DIAGNOSIS — E7800 Pure hypercholesterolemia, unspecified: Secondary | ICD-10-CM | POA: Diagnosis not present

## 2011-03-10 ENCOUNTER — Ambulatory Visit: Payer: No Typology Code available for payment source

## 2011-03-13 ENCOUNTER — Encounter: Payer: No Typology Code available for payment source | Admitting: Physical Therapy

## 2011-03-15 ENCOUNTER — Ambulatory Visit: Payer: No Typology Code available for payment source | Admitting: Physical Therapy

## 2011-03-16 DIAGNOSIS — F3113 Bipolar disorder, current episode manic without psychotic features, severe: Secondary | ICD-10-CM | POA: Diagnosis not present

## 2011-03-17 ENCOUNTER — Encounter: Payer: No Typology Code available for payment source | Admitting: Physical Therapy

## 2011-03-20 ENCOUNTER — Ambulatory Visit: Payer: No Typology Code available for payment source | Admitting: Physical Therapy

## 2011-03-22 ENCOUNTER — Encounter: Payer: No Typology Code available for payment source | Admitting: Physical Therapy

## 2011-03-22 ENCOUNTER — Ambulatory Visit: Payer: No Typology Code available for payment source | Admitting: Physical Therapy

## 2011-03-27 ENCOUNTER — Ambulatory Visit: Payer: No Typology Code available for payment source | Admitting: Physical Therapy

## 2011-03-29 ENCOUNTER — Encounter: Payer: No Typology Code available for payment source | Admitting: Physical Therapy

## 2011-04-03 ENCOUNTER — Ambulatory Visit: Payer: No Typology Code available for payment source | Attending: Family Medicine | Admitting: Physical Therapy

## 2011-04-03 DIAGNOSIS — IMO0001 Reserved for inherently not codable concepts without codable children: Secondary | ICD-10-CM | POA: Diagnosis not present

## 2011-04-03 DIAGNOSIS — R5381 Other malaise: Secondary | ICD-10-CM | POA: Insufficient documentation

## 2011-04-03 DIAGNOSIS — M542 Cervicalgia: Secondary | ICD-10-CM | POA: Insufficient documentation

## 2011-04-03 DIAGNOSIS — M25519 Pain in unspecified shoulder: Secondary | ICD-10-CM | POA: Insufficient documentation

## 2011-04-05 ENCOUNTER — Encounter: Payer: No Typology Code available for payment source | Admitting: Physical Therapy

## 2011-04-05 ENCOUNTER — Ambulatory Visit: Payer: No Typology Code available for payment source

## 2011-04-05 DIAGNOSIS — M25519 Pain in unspecified shoulder: Secondary | ICD-10-CM | POA: Diagnosis not present

## 2011-04-05 DIAGNOSIS — IMO0001 Reserved for inherently not codable concepts without codable children: Secondary | ICD-10-CM | POA: Diagnosis not present

## 2011-04-05 DIAGNOSIS — M542 Cervicalgia: Secondary | ICD-10-CM | POA: Diagnosis not present

## 2011-04-05 DIAGNOSIS — R5381 Other malaise: Secondary | ICD-10-CM | POA: Diagnosis not present

## 2011-04-07 DIAGNOSIS — F3113 Bipolar disorder, current episode manic without psychotic features, severe: Secondary | ICD-10-CM | POA: Diagnosis not present

## 2011-04-10 ENCOUNTER — Ambulatory Visit: Payer: No Typology Code available for payment source | Admitting: Physical Therapy

## 2011-04-10 DIAGNOSIS — IMO0001 Reserved for inherently not codable concepts without codable children: Secondary | ICD-10-CM | POA: Diagnosis not present

## 2011-04-10 DIAGNOSIS — R5381 Other malaise: Secondary | ICD-10-CM | POA: Diagnosis not present

## 2011-04-10 DIAGNOSIS — M25519 Pain in unspecified shoulder: Secondary | ICD-10-CM | POA: Diagnosis not present

## 2011-04-10 DIAGNOSIS — M542 Cervicalgia: Secondary | ICD-10-CM | POA: Diagnosis not present

## 2011-04-12 ENCOUNTER — Ambulatory Visit: Payer: No Typology Code available for payment source | Admitting: Physical Therapy

## 2011-04-12 DIAGNOSIS — M542 Cervicalgia: Secondary | ICD-10-CM | POA: Diagnosis not present

## 2011-04-12 DIAGNOSIS — R5381 Other malaise: Secondary | ICD-10-CM | POA: Diagnosis not present

## 2011-04-12 DIAGNOSIS — IMO0001 Reserved for inherently not codable concepts without codable children: Secondary | ICD-10-CM | POA: Diagnosis not present

## 2011-04-12 DIAGNOSIS — M25519 Pain in unspecified shoulder: Secondary | ICD-10-CM | POA: Diagnosis not present

## 2011-04-17 ENCOUNTER — Ambulatory Visit: Payer: No Typology Code available for payment source | Admitting: Physical Therapy

## 2011-04-17 DIAGNOSIS — IMO0001 Reserved for inherently not codable concepts without codable children: Secondary | ICD-10-CM | POA: Diagnosis not present

## 2011-04-17 DIAGNOSIS — M25519 Pain in unspecified shoulder: Secondary | ICD-10-CM | POA: Diagnosis not present

## 2011-04-17 DIAGNOSIS — M542 Cervicalgia: Secondary | ICD-10-CM | POA: Diagnosis not present

## 2011-04-17 DIAGNOSIS — R5381 Other malaise: Secondary | ICD-10-CM | POA: Diagnosis not present

## 2011-04-18 ENCOUNTER — Other Ambulatory Visit: Payer: Self-pay | Admitting: Dermatology

## 2011-04-18 DIAGNOSIS — C4401 Basal cell carcinoma of skin of lip: Secondary | ICD-10-CM | POA: Diagnosis not present

## 2011-04-18 DIAGNOSIS — D485 Neoplasm of uncertain behavior of skin: Secondary | ICD-10-CM | POA: Diagnosis not present

## 2011-04-18 DIAGNOSIS — L719 Rosacea, unspecified: Secondary | ICD-10-CM | POA: Diagnosis not present

## 2011-04-18 DIAGNOSIS — C44111 Basal cell carcinoma of skin of unspecified eyelid, including canthus: Secondary | ICD-10-CM | POA: Diagnosis not present

## 2011-04-19 ENCOUNTER — Ambulatory Visit: Payer: No Typology Code available for payment source | Admitting: Physical Therapy

## 2011-04-19 DIAGNOSIS — R5381 Other malaise: Secondary | ICD-10-CM | POA: Diagnosis not present

## 2011-04-19 DIAGNOSIS — M25519 Pain in unspecified shoulder: Secondary | ICD-10-CM | POA: Diagnosis not present

## 2011-04-19 DIAGNOSIS — IMO0001 Reserved for inherently not codable concepts without codable children: Secondary | ICD-10-CM | POA: Diagnosis not present

## 2011-04-19 DIAGNOSIS — M542 Cervicalgia: Secondary | ICD-10-CM | POA: Diagnosis not present

## 2011-04-24 ENCOUNTER — Ambulatory Visit: Payer: No Typology Code available for payment source | Admitting: Physical Therapy

## 2011-04-24 DIAGNOSIS — R5381 Other malaise: Secondary | ICD-10-CM | POA: Diagnosis not present

## 2011-04-24 DIAGNOSIS — IMO0001 Reserved for inherently not codable concepts without codable children: Secondary | ICD-10-CM | POA: Diagnosis not present

## 2011-04-24 DIAGNOSIS — M25519 Pain in unspecified shoulder: Secondary | ICD-10-CM | POA: Diagnosis not present

## 2011-04-24 DIAGNOSIS — M542 Cervicalgia: Secondary | ICD-10-CM | POA: Diagnosis not present

## 2011-04-26 ENCOUNTER — Encounter: Payer: No Typology Code available for payment source | Admitting: Physical Therapy

## 2011-04-26 ENCOUNTER — Ambulatory Visit: Payer: No Typology Code available for payment source

## 2011-04-26 DIAGNOSIS — IMO0001 Reserved for inherently not codable concepts without codable children: Secondary | ICD-10-CM | POA: Diagnosis not present

## 2011-04-26 DIAGNOSIS — M542 Cervicalgia: Secondary | ICD-10-CM | POA: Diagnosis not present

## 2011-04-26 DIAGNOSIS — M25519 Pain in unspecified shoulder: Secondary | ICD-10-CM | POA: Diagnosis not present

## 2011-04-26 DIAGNOSIS — R5381 Other malaise: Secondary | ICD-10-CM | POA: Diagnosis not present

## 2011-05-02 DIAGNOSIS — F3113 Bipolar disorder, current episode manic without psychotic features, severe: Secondary | ICD-10-CM | POA: Diagnosis not present

## 2011-05-02 DIAGNOSIS — C44319 Basal cell carcinoma of skin of other parts of face: Secondary | ICD-10-CM | POA: Diagnosis not present

## 2011-05-08 DIAGNOSIS — J3089 Other allergic rhinitis: Secondary | ICD-10-CM | POA: Diagnosis not present

## 2011-05-08 DIAGNOSIS — J45909 Unspecified asthma, uncomplicated: Secondary | ICD-10-CM | POA: Diagnosis not present

## 2011-05-08 DIAGNOSIS — H1045 Other chronic allergic conjunctivitis: Secondary | ICD-10-CM | POA: Diagnosis not present

## 2011-05-08 DIAGNOSIS — J301 Allergic rhinitis due to pollen: Secondary | ICD-10-CM | POA: Diagnosis not present

## 2011-05-16 DIAGNOSIS — F3113 Bipolar disorder, current episode manic without psychotic features, severe: Secondary | ICD-10-CM | POA: Diagnosis not present

## 2011-06-15 DIAGNOSIS — J309 Allergic rhinitis, unspecified: Secondary | ICD-10-CM | POA: Diagnosis not present

## 2011-06-23 DIAGNOSIS — J309 Allergic rhinitis, unspecified: Secondary | ICD-10-CM | POA: Diagnosis not present

## 2011-06-26 DIAGNOSIS — J309 Allergic rhinitis, unspecified: Secondary | ICD-10-CM | POA: Diagnosis not present

## 2011-06-28 DIAGNOSIS — F3113 Bipolar disorder, current episode manic without psychotic features, severe: Secondary | ICD-10-CM | POA: Diagnosis not present

## 2011-06-30 DIAGNOSIS — J309 Allergic rhinitis, unspecified: Secondary | ICD-10-CM | POA: Diagnosis not present

## 2011-07-03 DIAGNOSIS — J309 Allergic rhinitis, unspecified: Secondary | ICD-10-CM | POA: Diagnosis not present

## 2011-07-04 DIAGNOSIS — D485 Neoplasm of uncertain behavior of skin: Secondary | ICD-10-CM | POA: Diagnosis not present

## 2011-07-04 DIAGNOSIS — C4401 Basal cell carcinoma of skin of lip: Secondary | ICD-10-CM | POA: Diagnosis not present

## 2011-07-04 DIAGNOSIS — D2330 Other benign neoplasm of skin of unspecified part of face: Secondary | ICD-10-CM | POA: Diagnosis not present

## 2011-07-11 DIAGNOSIS — J309 Allergic rhinitis, unspecified: Secondary | ICD-10-CM | POA: Diagnosis not present

## 2011-07-13 DIAGNOSIS — J45901 Unspecified asthma with (acute) exacerbation: Secondary | ICD-10-CM | POA: Diagnosis not present

## 2011-07-13 DIAGNOSIS — J3089 Other allergic rhinitis: Secondary | ICD-10-CM | POA: Diagnosis not present

## 2011-07-13 DIAGNOSIS — H1045 Other chronic allergic conjunctivitis: Secondary | ICD-10-CM | POA: Diagnosis not present

## 2011-07-13 DIAGNOSIS — J301 Allergic rhinitis due to pollen: Secondary | ICD-10-CM | POA: Diagnosis not present

## 2011-07-13 DIAGNOSIS — J209 Acute bronchitis, unspecified: Secondary | ICD-10-CM | POA: Diagnosis not present

## 2011-07-14 DIAGNOSIS — F3113 Bipolar disorder, current episode manic without psychotic features, severe: Secondary | ICD-10-CM | POA: Diagnosis not present

## 2011-07-17 DIAGNOSIS — D239 Other benign neoplasm of skin, unspecified: Secondary | ICD-10-CM | POA: Diagnosis not present

## 2011-07-17 DIAGNOSIS — L719 Rosacea, unspecified: Secondary | ICD-10-CM | POA: Diagnosis not present

## 2011-07-17 DIAGNOSIS — Z85828 Personal history of other malignant neoplasm of skin: Secondary | ICD-10-CM | POA: Diagnosis not present

## 2011-07-19 DIAGNOSIS — J309 Allergic rhinitis, unspecified: Secondary | ICD-10-CM | POA: Diagnosis not present

## 2011-07-21 DIAGNOSIS — J309 Allergic rhinitis, unspecified: Secondary | ICD-10-CM | POA: Diagnosis not present

## 2011-07-26 DIAGNOSIS — K6289 Other specified diseases of anus and rectum: Secondary | ICD-10-CM | POA: Diagnosis not present

## 2011-07-26 DIAGNOSIS — R509 Fever, unspecified: Secondary | ICD-10-CM | POA: Diagnosis not present

## 2011-07-27 ENCOUNTER — Encounter (INDEPENDENT_AMBULATORY_CARE_PROVIDER_SITE_OTHER): Payer: Self-pay | Admitting: Surgery

## 2011-07-27 ENCOUNTER — Ambulatory Visit (INDEPENDENT_AMBULATORY_CARE_PROVIDER_SITE_OTHER): Payer: Medicare Other | Admitting: Surgery

## 2011-07-27 VITALS — BP 100/70 | HR 72 | Temp 97.7°F | Resp 18 | Ht 62.5 in | Wt 112.0 lb

## 2011-07-27 DIAGNOSIS — K645 Perianal venous thrombosis: Secondary | ICD-10-CM | POA: Diagnosis not present

## 2011-07-27 NOTE — Progress Notes (Signed)
Subjective:     Patient ID: Danielle Bruce, female   DOB: Jan 01, 1961, 51 y.o.   MRN: [id number]  HPI She is referred by Dr. Matthias Hughs for perianal discomfort. She has had hemorrhoidectomy several times in the past. She has a tender area in her perianal area that may be a small cyst.  Review of Systems     Objective:   Physical Exam On exam, an area of tenderness it appears near there is a very tiny thrombosed hemorrhoid. Her entire perianal area slightly tender. After discussing with her, I prepped the area Betadine and incised and drained a small hemorrhoid with a scalpel    Assessment:     Tiny thrombosed external hemorrhoid    Plan:     Wound care instructions were given. I wrote her for hydrocodone, lidocaine cream, and Analpram. I will see her back as needed

## 2011-07-28 ENCOUNTER — Telehealth (INDEPENDENT_AMBULATORY_CARE_PROVIDER_SITE_OTHER): Payer: Self-pay | Admitting: General Surgery

## 2011-07-28 NOTE — Telephone Encounter (Signed)
Pt calling to ask about post op "lanced hems."  She states the tissue is quite swollen and sore.  Encouraged warm bath soaks, and using the creams as ordered.  She is not taking the Vicodin, because there is little pain, just swelling.  Suggested she could try ibuprofen for its anti-inflammatory properties.

## 2011-07-31 ENCOUNTER — Ambulatory Visit (INDEPENDENT_AMBULATORY_CARE_PROVIDER_SITE_OTHER): Payer: Medicare Other | Admitting: Surgery

## 2011-07-31 ENCOUNTER — Encounter (INDEPENDENT_AMBULATORY_CARE_PROVIDER_SITE_OTHER): Payer: Self-pay | Admitting: Surgery

## 2011-07-31 VITALS — BP 118/74 | HR 73 | Temp 99.2°F | Ht 62.0 in | Wt 113.8 lb

## 2011-07-31 DIAGNOSIS — J309 Allergic rhinitis, unspecified: Secondary | ICD-10-CM | POA: Diagnosis not present

## 2011-07-31 DIAGNOSIS — K649 Unspecified hemorrhoids: Secondary | ICD-10-CM

## 2011-07-31 NOTE — Progress Notes (Signed)
Visit Diagnoses: 1. Hemorrhoids     HISTORY: Patient is a 51 year old white female who underwent a office and no rectal procedure for external thrombosed hemorrhoid 5 days ago. Patient continues to experience pain and discomfort. She has seen a small amount of bleeding. She asked for evaluation today.  EXAM: The anoderm shows a small amount of ecchymosis towards the posterior midline. Surgical incision is healing nicely. No fluctuance. No drainage. No sign of infection. Digital rectal examination shows normal tone without palpable mass.  IMPRESSION: Status post excision external thrombosed hemorrhoid  PLAN: Patient is reassured. I've asked her to continue to observe. I've asked her to use Analpram topically. I've asked her to remain on stool softeners.  Patient will return to see [place] as needed.  Velora Heckler, MD, FACS General & Endocrine Surgery [facility]

## 2011-07-31 NOTE — Patient Instructions (Signed)
ANORECTAL PROCEDURES: 1.  Tub soaks 2-3 times daily in warm water (may add Epsom salts if desired) 2.  Stool softener for one month (store brand Miralax or Colace) 3.  Avoid toilet paper - use baby wipes or Tucks pads 4.  Increase water intake - 6-8 glasses daily 5.  Apply dry pad to area until drainage stops 

## 2011-08-04 DIAGNOSIS — J309 Allergic rhinitis, unspecified: Secondary | ICD-10-CM | POA: Diagnosis not present

## 2011-08-07 DIAGNOSIS — J309 Allergic rhinitis, unspecified: Secondary | ICD-10-CM | POA: Diagnosis not present

## 2011-08-11 DIAGNOSIS — J309 Allergic rhinitis, unspecified: Secondary | ICD-10-CM | POA: Diagnosis not present

## 2011-08-14 DIAGNOSIS — J309 Allergic rhinitis, unspecified: Secondary | ICD-10-CM | POA: Diagnosis not present

## 2011-08-14 DIAGNOSIS — F332 Major depressive disorder, recurrent severe without psychotic features: Secondary | ICD-10-CM | POA: Diagnosis not present

## 2011-08-14 DIAGNOSIS — F3113 Bipolar disorder, current episode manic without psychotic features, severe: Secondary | ICD-10-CM | POA: Diagnosis not present

## 2011-08-17 DIAGNOSIS — J309 Allergic rhinitis, unspecified: Secondary | ICD-10-CM | POA: Diagnosis not present

## 2011-08-21 DIAGNOSIS — J309 Allergic rhinitis, unspecified: Secondary | ICD-10-CM | POA: Diagnosis not present

## 2011-08-28 ENCOUNTER — Other Ambulatory Visit: Payer: Self-pay | Admitting: Family Medicine

## 2011-08-28 DIAGNOSIS — E7800 Pure hypercholesterolemia, unspecified: Secondary | ICD-10-CM | POA: Diagnosis not present

## 2011-08-28 DIAGNOSIS — M949 Disorder of cartilage, unspecified: Secondary | ICD-10-CM | POA: Diagnosis not present

## 2011-08-28 DIAGNOSIS — F411 Generalized anxiety disorder: Secondary | ICD-10-CM | POA: Diagnosis not present

## 2011-08-28 DIAGNOSIS — Z1231 Encounter for screening mammogram for malignant neoplasm of breast: Secondary | ICD-10-CM

## 2011-08-28 DIAGNOSIS — B977 Papillomavirus as the cause of diseases classified elsewhere: Secondary | ICD-10-CM

## 2011-08-28 DIAGNOSIS — M899 Disorder of bone, unspecified: Secondary | ICD-10-CM | POA: Diagnosis not present

## 2011-08-28 DIAGNOSIS — Z0000 Encounter for general adult medical examination without abnormal findings: Secondary | ICD-10-CM | POA: Diagnosis not present

## 2011-08-28 DIAGNOSIS — K5901 Slow transit constipation: Secondary | ICD-10-CM | POA: Diagnosis not present

## 2011-08-28 DIAGNOSIS — G43909 Migraine, unspecified, not intractable, without status migrainosus: Secondary | ICD-10-CM | POA: Diagnosis not present

## 2011-08-28 DIAGNOSIS — F39 Unspecified mood [affective] disorder: Secondary | ICD-10-CM | POA: Diagnosis not present

## 2011-08-28 DIAGNOSIS — Z8742 Personal history of other diseases of the female genital tract: Secondary | ICD-10-CM | POA: Diagnosis not present

## 2011-08-28 HISTORY — DX: Papillomavirus as the cause of diseases classified elsewhere: B97.7

## 2011-08-30 DIAGNOSIS — J309 Allergic rhinitis, unspecified: Secondary | ICD-10-CM | POA: Diagnosis not present

## 2011-09-01 DIAGNOSIS — J309 Allergic rhinitis, unspecified: Secondary | ICD-10-CM | POA: Diagnosis not present

## 2011-09-04 ENCOUNTER — Ambulatory Visit
Admission: RE | Admit: 2011-09-04 | Discharge: 2011-09-04 | Disposition: A | Discharge status: Home / Self Care | Payer: No Typology Code available for payment source | Source: Ambulatory Visit | Attending: Family Medicine | Admitting: Family Medicine

## 2011-09-04 DIAGNOSIS — Z1231 Encounter for screening mammogram for malignant neoplasm of breast: Secondary | ICD-10-CM | POA: Diagnosis not present

## 2011-09-06 ENCOUNTER — Emergency Department (HOSPITAL_COMMUNITY)
Admission: EM | Admit: 2011-09-06 | Discharge: 2011-09-06 | Disposition: A | Discharge status: Home / Self Care | Payer: Medicare Other

## 2011-09-06 ENCOUNTER — Other Ambulatory Visit: Payer: Self-pay | Admitting: Family Medicine

## 2011-09-06 DIAGNOSIS — R110 Nausea: Secondary | ICD-10-CM | POA: Diagnosis not present

## 2011-09-06 DIAGNOSIS — R928 Other abnormal and inconclusive findings on diagnostic imaging of breast: Secondary | ICD-10-CM

## 2011-09-06 DIAGNOSIS — R51 Headache: Secondary | ICD-10-CM | POA: Diagnosis not present

## 2011-09-06 NOTE — ED Notes (Signed)
PT called x's 3.. No answer

## 2011-09-07 ENCOUNTER — Encounter: Payer: No Typology Code available for payment source | Admitting: Gynecology

## 2011-09-12 ENCOUNTER — Ambulatory Visit
Admission: RE | Admit: 2011-09-12 | Discharge: 2011-09-12 | Disposition: A | Discharge status: Home / Self Care | Payer: No Typology Code available for payment source | Source: Ambulatory Visit | Attending: Family Medicine | Admitting: Family Medicine

## 2011-09-12 ENCOUNTER — Other Ambulatory Visit: Payer: Self-pay | Admitting: Family Medicine

## 2011-09-12 DIAGNOSIS — R920 Mammographic microcalcification found on diagnostic imaging of breast: Secondary | ICD-10-CM | POA: Diagnosis not present

## 2011-09-12 DIAGNOSIS — R928 Other abnormal and inconclusive findings on diagnostic imaging of breast: Secondary | ICD-10-CM

## 2011-09-13 ENCOUNTER — Encounter: Payer: Self-pay | Admitting: Gynecology

## 2011-09-13 ENCOUNTER — Ambulatory Visit (INDEPENDENT_AMBULATORY_CARE_PROVIDER_SITE_OTHER): Payer: Medicare Other | Admitting: Gynecology

## 2011-09-13 ENCOUNTER — Other Ambulatory Visit (HOSPITAL_COMMUNITY)
Admission: RE | Admit: 2011-09-13 | Discharge: 2011-09-13 | Disposition: A | Discharge status: Home / Self Care | Payer: Medicare Other | Source: Ambulatory Visit | Attending: Gynecology | Admitting: Gynecology

## 2011-09-13 VITALS — BP 114/64 | Ht 62.0 in | Wt 112.0 lb

## 2011-09-13 DIAGNOSIS — R87810 Cervical high risk human papillomavirus (HPV) DNA test positive: Secondary | ICD-10-CM | POA: Diagnosis not present

## 2011-09-13 DIAGNOSIS — N912 Amenorrhea, unspecified: Secondary | ICD-10-CM | POA: Diagnosis not present

## 2011-09-13 DIAGNOSIS — Z124 Encounter for screening for malignant neoplasm of cervix: Secondary | ICD-10-CM | POA: Diagnosis not present

## 2011-09-13 DIAGNOSIS — Z01419 Encounter for gynecological examination (general) (routine) without abnormal findings: Secondary | ICD-10-CM | POA: Diagnosis not present

## 2011-09-13 DIAGNOSIS — R928 Other abnormal and inconclusive findings on diagnostic imaging of breast: Secondary | ICD-10-CM

## 2011-09-13 DIAGNOSIS — F419 Anxiety disorder, unspecified: Secondary | ICD-10-CM | POA: Insufficient documentation

## 2011-09-13 DIAGNOSIS — R921 Mammographic calcification found on diagnostic imaging of breast: Secondary | ICD-10-CM

## 2011-09-13 NOTE — Progress Notes (Signed)
Danielle Bruce [birth year] [id number]        51 y.o.  [id number] for follow up exam.  Several issues below.  Past medical history,surgical history, medications, allergies, family history and social history were all reviewed and documented in the EPIC chart. ROS:  Was performed and pertinent positives and negatives are included in the history.  Exam: Kim assistant Filed Vitals:   09/13/11 1510  BP: 114/64  Height: 5\' 2"  (1.575 m)  Weight: 112 lb (50.803 kg)   General appearance  Normal Skin grossly normal Head/Neck normal with no cervical or supraclavicular adenopathy thyroid normal Lungs  clear Cardiac RR, without RMG Abdominal  soft, nontender, without masses, organomegaly or hernia Breasts  examined lying and sitting without masses, retractions, discharge or axillary adenopathy. Pelvic  Ext/BUS/vagina  normal   Cervix  normal Pap/HPV, IUD string not visualized  Uterus  anteverted, normal size, shape and contour, midline and mobile nontender   Adnexa  Without masses or tenderness    Anus and perineum  normal   Rectovaginal  normal sphincter tone without palpated masses or tenderness.    Assessment/Plan:  51 y.o. [id number] female for annual exam.   1. IUD management. Mirena IUD placed 01/04/2010. String not visualized. Ultrasound 12/2010 showed intrauterine placement. Patients amenorrheic. We'll continue to monitor. Patient comfortable with following and not repeating ultrasound. 2. Breast calcifications.  Recent mammography showed indeterminate calcifications right breast. Radiologist had recommended stereotactic biopsy. Patient, due to her mental health required sedation and radiologist did not feel comfortable with that and offered needle localized excision by general surgery. Patient wanted to wait and talk to me about this. I reviewed the findings with her my recommendation that this certainly needs to be evaluated and sampled.  Options would be the stereotactic biopsy versus needle  localization. Either way she will have the needle placed, it is more involved with the stereotactic but obviously with an excisional biopsy more involved as far as the intraoperative/postoperative care. The patient's main concern is being able to be premedicated before the stereotactic biopsy. I suggested she discuss with a different radiologist their comfort level and gave her the name of several radiologists to arrange a second opinion discussion. Maybe they would feel more comfortable with pre-medicating her before the stereotactic biopsy. If they do not and she ultimately decides she wants to proceed with a needle localized open biopsy then she knows to call me and we will arrange a general surgical consult. I stressed the absolute need for follow up with this and the possibilities of breast cancer and her mother and best friend were both here in attendance to support her as well as hear my recommendations. 3. Pap smear. Last Pap smear 2011. Pap/HPV done today. 4. Health maintenance. No routine blood work was done and she sees her primary physician for routine health care and her psychiatrist for mental health.   Dara Lords MD, 4:04 PM 09/13/2011

## 2011-09-13 NOTE — Patient Instructions (Signed)
Call radiologists that I gave you 2 discussed stereotactic biopsy. If you decide to pursue open biopsy call me and I will arrange with general surgery or the radiologist to arrange for this. Regardless you need to have this area in your breast evaluated to rule out cancer. Follow up with me for routine exam in one year.

## 2011-09-14 ENCOUNTER — Other Ambulatory Visit: Payer: Self-pay | Admitting: *Deleted

## 2011-09-14 DIAGNOSIS — R921 Mammographic calcification found on diagnostic imaging of breast: Secondary | ICD-10-CM

## 2011-09-15 DIAGNOSIS — J309 Allergic rhinitis, unspecified: Secondary | ICD-10-CM | POA: Diagnosis not present

## 2011-09-19 ENCOUNTER — Other Ambulatory Visit: Payer: Self-pay | Admitting: Gynecology

## 2011-09-19 DIAGNOSIS — J309 Allergic rhinitis, unspecified: Secondary | ICD-10-CM | POA: Diagnosis not present

## 2011-09-19 DIAGNOSIS — R921 Mammographic calcification found on diagnostic imaging of breast: Secondary | ICD-10-CM

## 2011-09-20 ENCOUNTER — Telehealth: Payer: Self-pay | Admitting: Gynecology

## 2011-09-20 ENCOUNTER — Encounter: Payer: Self-pay | Admitting: Gynecology

## 2011-09-20 NOTE — Telephone Encounter (Signed)
Two issues: 1. Ask patient her follow up as far as the breast biopsy. Did she arrange with radiology or do we need to call general surgery? 2. Her Pap smear showed positive high risk HPV screen but the cytology was normal. I recommend follow up Pap smear with HPV screening in 1 year.

## 2011-09-21 NOTE — Telephone Encounter (Signed)
Pt informed with the below note, her breast appointment is on 10/10/11 regarding breast.

## 2011-09-21 NOTE — Telephone Encounter (Signed)
Left message for pt to call.

## 2011-09-22 DIAGNOSIS — F3113 Bipolar disorder, current episode manic without psychotic features, severe: Secondary | ICD-10-CM | POA: Diagnosis not present

## 2011-09-22 DIAGNOSIS — J309 Allergic rhinitis, unspecified: Secondary | ICD-10-CM | POA: Diagnosis not present

## 2011-10-06 DIAGNOSIS — J309 Allergic rhinitis, unspecified: Secondary | ICD-10-CM | POA: Diagnosis not present

## 2011-10-10 ENCOUNTER — Inpatient Hospital Stay: Admission: RE | Admit: 2011-10-10 | Payer: No Typology Code available for payment source | Source: Ambulatory Visit

## 2011-10-10 ENCOUNTER — Ambulatory Visit
Admission: RE | Admit: 2011-10-10 | Discharge: 2011-10-10 | Disposition: A | Discharge status: Home / Self Care | Payer: Medicare Other | Source: Ambulatory Visit | Attending: Gynecology | Admitting: Gynecology

## 2011-10-10 ENCOUNTER — Other Ambulatory Visit: Payer: Self-pay | Admitting: Gynecology

## 2011-10-10 DIAGNOSIS — N6019 Diffuse cystic mastopathy of unspecified breast: Secondary | ICD-10-CM | POA: Diagnosis not present

## 2011-10-10 DIAGNOSIS — R920 Mammographic microcalcification found on diagnostic imaging of breast: Secondary | ICD-10-CM | POA: Diagnosis not present

## 2011-10-10 DIAGNOSIS — R928 Other abnormal and inconclusive findings on diagnostic imaging of breast: Secondary | ICD-10-CM | POA: Diagnosis not present

## 2011-10-10 DIAGNOSIS — R921 Mammographic calcification found on diagnostic imaging of breast: Secondary | ICD-10-CM

## 2011-10-13 DIAGNOSIS — J309 Allergic rhinitis, unspecified: Secondary | ICD-10-CM | POA: Diagnosis not present

## 2011-10-20 DIAGNOSIS — F3113 Bipolar disorder, current episode manic without psychotic features, severe: Secondary | ICD-10-CM | POA: Diagnosis not present

## 2011-10-24 DIAGNOSIS — J309 Allergic rhinitis, unspecified: Secondary | ICD-10-CM | POA: Diagnosis not present

## 2011-10-27 DIAGNOSIS — J309 Allergic rhinitis, unspecified: Secondary | ICD-10-CM | POA: Diagnosis not present

## 2011-11-13 DIAGNOSIS — J301 Allergic rhinitis due to pollen: Secondary | ICD-10-CM | POA: Diagnosis not present

## 2011-11-13 DIAGNOSIS — H1045 Other chronic allergic conjunctivitis: Secondary | ICD-10-CM | POA: Diagnosis not present

## 2011-11-13 DIAGNOSIS — J45909 Unspecified asthma, uncomplicated: Secondary | ICD-10-CM | POA: Diagnosis not present

## 2011-11-13 DIAGNOSIS — J3089 Other allergic rhinitis: Secondary | ICD-10-CM | POA: Diagnosis not present

## 2011-11-13 DIAGNOSIS — J309 Allergic rhinitis, unspecified: Secondary | ICD-10-CM | POA: Diagnosis not present

## 2011-11-22 DIAGNOSIS — F3113 Bipolar disorder, current episode manic without psychotic features, severe: Secondary | ICD-10-CM | POA: Diagnosis not present

## 2011-11-22 DIAGNOSIS — J309 Allergic rhinitis, unspecified: Secondary | ICD-10-CM | POA: Diagnosis not present

## 2011-11-29 DIAGNOSIS — J309 Allergic rhinitis, unspecified: Secondary | ICD-10-CM | POA: Diagnosis not present

## 2011-12-07 DIAGNOSIS — J309 Allergic rhinitis, unspecified: Secondary | ICD-10-CM | POA: Diagnosis not present

## 2011-12-14 DIAGNOSIS — M542 Cervicalgia: Secondary | ICD-10-CM | POA: Diagnosis not present

## 2011-12-14 DIAGNOSIS — G43909 Migraine, unspecified, not intractable, without status migrainosus: Secondary | ICD-10-CM | POA: Diagnosis not present

## 2011-12-22 DIAGNOSIS — F3113 Bipolar disorder, current episode manic without psychotic features, severe: Secondary | ICD-10-CM | POA: Diagnosis not present

## 2011-12-25 ENCOUNTER — Ambulatory Visit: Payer: Medicare Other | Attending: Family Medicine

## 2011-12-25 DIAGNOSIS — IMO0001 Reserved for inherently not codable concepts without codable children: Secondary | ICD-10-CM | POA: Diagnosis not present

## 2011-12-25 DIAGNOSIS — R5381 Other malaise: Secondary | ICD-10-CM | POA: Insufficient documentation

## 2011-12-25 DIAGNOSIS — M542 Cervicalgia: Secondary | ICD-10-CM | POA: Diagnosis not present

## 2011-12-25 DIAGNOSIS — J309 Allergic rhinitis, unspecified: Secondary | ICD-10-CM | POA: Diagnosis not present

## 2011-12-25 DIAGNOSIS — R51 Headache: Secondary | ICD-10-CM | POA: Insufficient documentation

## 2011-12-27 ENCOUNTER — Ambulatory Visit: Payer: Medicare Other | Admitting: Physical Therapy

## 2012-01-01 ENCOUNTER — Ambulatory Visit: Payer: Medicare Other | Attending: Family Medicine | Admitting: Physical Therapy

## 2012-01-01 DIAGNOSIS — R5381 Other malaise: Secondary | ICD-10-CM | POA: Insufficient documentation

## 2012-01-01 DIAGNOSIS — IMO0001 Reserved for inherently not codable concepts without codable children: Secondary | ICD-10-CM | POA: Insufficient documentation

## 2012-01-01 DIAGNOSIS — R51 Headache: Secondary | ICD-10-CM | POA: Insufficient documentation

## 2012-01-01 DIAGNOSIS — M542 Cervicalgia: Secondary | ICD-10-CM | POA: Diagnosis not present

## 2012-01-01 DIAGNOSIS — M2569 Stiffness of other specified joint, not elsewhere classified: Secondary | ICD-10-CM | POA: Diagnosis not present

## 2012-01-01 DIAGNOSIS — J309 Allergic rhinitis, unspecified: Secondary | ICD-10-CM | POA: Diagnosis not present

## 2012-01-04 ENCOUNTER — Ambulatory Visit: Payer: Medicare Other | Admitting: Physical Therapy

## 2012-01-08 ENCOUNTER — Ambulatory Visit: Payer: Medicare Other | Admitting: Physical Therapy

## 2012-01-08 DIAGNOSIS — R5381 Other malaise: Secondary | ICD-10-CM | POA: Diagnosis not present

## 2012-01-08 DIAGNOSIS — J309 Allergic rhinitis, unspecified: Secondary | ICD-10-CM | POA: Diagnosis not present

## 2012-01-08 DIAGNOSIS — IMO0001 Reserved for inherently not codable concepts without codable children: Secondary | ICD-10-CM | POA: Diagnosis not present

## 2012-01-08 DIAGNOSIS — M2569 Stiffness of other specified joint, not elsewhere classified: Secondary | ICD-10-CM | POA: Diagnosis not present

## 2012-01-08 DIAGNOSIS — M542 Cervicalgia: Secondary | ICD-10-CM | POA: Diagnosis not present

## 2012-01-08 DIAGNOSIS — R51 Headache: Secondary | ICD-10-CM | POA: Diagnosis not present

## 2012-01-11 ENCOUNTER — Ambulatory Visit: Payer: Medicare Other | Admitting: Physical Therapy

## 2012-01-11 DIAGNOSIS — M542 Cervicalgia: Secondary | ICD-10-CM | POA: Diagnosis not present

## 2012-01-11 DIAGNOSIS — R51 Headache: Secondary | ICD-10-CM | POA: Diagnosis not present

## 2012-01-11 DIAGNOSIS — M2569 Stiffness of other specified joint, not elsewhere classified: Secondary | ICD-10-CM | POA: Diagnosis not present

## 2012-01-11 DIAGNOSIS — J309 Allergic rhinitis, unspecified: Secondary | ICD-10-CM | POA: Diagnosis not present

## 2012-01-11 DIAGNOSIS — R5381 Other malaise: Secondary | ICD-10-CM | POA: Diagnosis not present

## 2012-01-11 DIAGNOSIS — IMO0001 Reserved for inherently not codable concepts without codable children: Secondary | ICD-10-CM | POA: Diagnosis not present

## 2012-01-15 ENCOUNTER — Ambulatory Visit: Payer: Medicare Other | Admitting: Physical Therapy

## 2012-01-15 DIAGNOSIS — M542 Cervicalgia: Secondary | ICD-10-CM | POA: Diagnosis not present

## 2012-01-15 DIAGNOSIS — IMO0001 Reserved for inherently not codable concepts without codable children: Secondary | ICD-10-CM | POA: Diagnosis not present

## 2012-01-15 DIAGNOSIS — R51 Headache: Secondary | ICD-10-CM | POA: Diagnosis not present

## 2012-01-15 DIAGNOSIS — R5381 Other malaise: Secondary | ICD-10-CM | POA: Diagnosis not present

## 2012-01-15 DIAGNOSIS — M2569 Stiffness of other specified joint, not elsewhere classified: Secondary | ICD-10-CM | POA: Diagnosis not present

## 2012-01-15 DIAGNOSIS — J309 Allergic rhinitis, unspecified: Secondary | ICD-10-CM | POA: Diagnosis not present

## 2012-01-16 DIAGNOSIS — J309 Allergic rhinitis, unspecified: Secondary | ICD-10-CM | POA: Diagnosis not present

## 2012-01-18 ENCOUNTER — Ambulatory Visit: Payer: Medicare Other | Admitting: Physical Therapy

## 2012-01-18 DIAGNOSIS — R51 Headache: Secondary | ICD-10-CM | POA: Diagnosis not present

## 2012-01-18 DIAGNOSIS — R5381 Other malaise: Secondary | ICD-10-CM | POA: Diagnosis not present

## 2012-01-18 DIAGNOSIS — M2569 Stiffness of other specified joint, not elsewhere classified: Secondary | ICD-10-CM | POA: Diagnosis not present

## 2012-01-18 DIAGNOSIS — IMO0001 Reserved for inherently not codable concepts without codable children: Secondary | ICD-10-CM | POA: Diagnosis not present

## 2012-01-18 DIAGNOSIS — J309 Allergic rhinitis, unspecified: Secondary | ICD-10-CM | POA: Diagnosis not present

## 2012-01-18 DIAGNOSIS — M542 Cervicalgia: Secondary | ICD-10-CM | POA: Diagnosis not present

## 2012-01-22 ENCOUNTER — Ambulatory Visit: Payer: Medicare Other | Admitting: Physical Therapy

## 2012-01-22 DIAGNOSIS — M2569 Stiffness of other specified joint, not elsewhere classified: Secondary | ICD-10-CM | POA: Diagnosis not present

## 2012-01-22 DIAGNOSIS — R5381 Other malaise: Secondary | ICD-10-CM | POA: Diagnosis not present

## 2012-01-22 DIAGNOSIS — IMO0001 Reserved for inherently not codable concepts without codable children: Secondary | ICD-10-CM | POA: Diagnosis not present

## 2012-01-22 DIAGNOSIS — R51 Headache: Secondary | ICD-10-CM | POA: Diagnosis not present

## 2012-01-22 DIAGNOSIS — F3113 Bipolar disorder, current episode manic without psychotic features, severe: Secondary | ICD-10-CM | POA: Diagnosis not present

## 2012-01-22 DIAGNOSIS — M542 Cervicalgia: Secondary | ICD-10-CM | POA: Diagnosis not present

## 2012-01-24 ENCOUNTER — Ambulatory Visit: Payer: Medicare Other | Admitting: Physical Therapy

## 2012-01-24 DIAGNOSIS — J309 Allergic rhinitis, unspecified: Secondary | ICD-10-CM | POA: Diagnosis not present

## 2012-01-24 DIAGNOSIS — R5381 Other malaise: Secondary | ICD-10-CM | POA: Diagnosis not present

## 2012-01-24 DIAGNOSIS — M542 Cervicalgia: Secondary | ICD-10-CM | POA: Diagnosis not present

## 2012-01-24 DIAGNOSIS — IMO0001 Reserved for inherently not codable concepts without codable children: Secondary | ICD-10-CM | POA: Diagnosis not present

## 2012-01-24 DIAGNOSIS — M2569 Stiffness of other specified joint, not elsewhere classified: Secondary | ICD-10-CM | POA: Diagnosis not present

## 2012-01-24 DIAGNOSIS — R51 Headache: Secondary | ICD-10-CM | POA: Diagnosis not present

## 2012-01-26 DIAGNOSIS — B029 Zoster without complications: Secondary | ICD-10-CM | POA: Diagnosis not present

## 2012-01-26 DIAGNOSIS — H6120 Impacted cerumen, unspecified ear: Secondary | ICD-10-CM | POA: Diagnosis not present

## 2012-01-28 DIAGNOSIS — B029 Zoster without complications: Secondary | ICD-10-CM | POA: Diagnosis not present

## 2012-01-31 ENCOUNTER — Encounter: Payer: Medicare Other | Admitting: Physical Therapy

## 2012-02-01 ENCOUNTER — Encounter: Payer: Medicare Other | Admitting: Physical Therapy

## 2012-02-19 DIAGNOSIS — F3113 Bipolar disorder, current episode manic without psychotic features, severe: Secondary | ICD-10-CM | POA: Diagnosis not present

## 2012-02-20 DIAGNOSIS — J309 Allergic rhinitis, unspecified: Secondary | ICD-10-CM | POA: Diagnosis not present

## 2012-02-23 DIAGNOSIS — J309 Allergic rhinitis, unspecified: Secondary | ICD-10-CM | POA: Diagnosis not present

## 2012-03-01 DIAGNOSIS — H1045 Other chronic allergic conjunctivitis: Secondary | ICD-10-CM | POA: Diagnosis not present

## 2012-03-01 DIAGNOSIS — J301 Allergic rhinitis due to pollen: Secondary | ICD-10-CM | POA: Diagnosis not present

## 2012-03-01 DIAGNOSIS — J3089 Other allergic rhinitis: Secondary | ICD-10-CM | POA: Diagnosis not present

## 2012-03-01 DIAGNOSIS — R059 Cough, unspecified: Secondary | ICD-10-CM | POA: Diagnosis not present

## 2012-03-01 DIAGNOSIS — J069 Acute upper respiratory infection, unspecified: Secondary | ICD-10-CM | POA: Diagnosis not present

## 2012-03-01 DIAGNOSIS — R05 Cough: Secondary | ICD-10-CM | POA: Diagnosis not present

## 2012-03-01 DIAGNOSIS — J45901 Unspecified asthma with (acute) exacerbation: Secondary | ICD-10-CM | POA: Diagnosis not present

## 2012-03-08 DIAGNOSIS — J309 Allergic rhinitis, unspecified: Secondary | ICD-10-CM | POA: Diagnosis not present

## 2012-03-11 DIAGNOSIS — J309 Allergic rhinitis, unspecified: Secondary | ICD-10-CM | POA: Diagnosis not present

## 2012-03-12 DIAGNOSIS — E7800 Pure hypercholesterolemia, unspecified: Secondary | ICD-10-CM | POA: Diagnosis not present

## 2012-03-12 DIAGNOSIS — J309 Allergic rhinitis, unspecified: Secondary | ICD-10-CM | POA: Diagnosis not present

## 2012-03-13 ENCOUNTER — Encounter (HOSPITAL_COMMUNITY): Payer: Self-pay | Admitting: *Deleted

## 2012-03-13 ENCOUNTER — Emergency Department (HOSPITAL_COMMUNITY)
Admission: EM | Admit: 2012-03-13 | Discharge: 2012-03-14 | Disposition: A | Discharge status: Home / Self Care | Payer: Medicare Other | Attending: Emergency Medicine | Admitting: Emergency Medicine

## 2012-03-13 DIAGNOSIS — Z3202 Encounter for pregnancy test, result negative: Secondary | ICD-10-CM | POA: Insufficient documentation

## 2012-03-13 DIAGNOSIS — IMO0002 Reserved for concepts with insufficient information to code with codable children: Secondary | ICD-10-CM | POA: Insufficient documentation

## 2012-03-13 DIAGNOSIS — F17200 Nicotine dependence, unspecified, uncomplicated: Secondary | ICD-10-CM | POA: Insufficient documentation

## 2012-03-13 DIAGNOSIS — Z9889 Other specified postprocedural states: Secondary | ICD-10-CM | POA: Insufficient documentation

## 2012-03-13 DIAGNOSIS — Z87828 Personal history of other (healed) physical injury and trauma: Secondary | ICD-10-CM | POA: Insufficient documentation

## 2012-03-13 DIAGNOSIS — H81399 Other peripheral vertigo, unspecified ear: Secondary | ICD-10-CM | POA: Insufficient documentation

## 2012-03-13 DIAGNOSIS — F411 Generalized anxiety disorder: Secondary | ICD-10-CM | POA: Diagnosis not present

## 2012-03-13 DIAGNOSIS — R110 Nausea: Secondary | ICD-10-CM | POA: Diagnosis not present

## 2012-03-13 DIAGNOSIS — F329 Major depressive disorder, single episode, unspecified: Secondary | ICD-10-CM | POA: Insufficient documentation

## 2012-03-13 DIAGNOSIS — K219 Gastro-esophageal reflux disease without esophagitis: Secondary | ICD-10-CM | POA: Diagnosis not present

## 2012-03-13 DIAGNOSIS — G43909 Migraine, unspecified, not intractable, without status migrainosus: Secondary | ICD-10-CM | POA: Diagnosis not present

## 2012-03-13 DIAGNOSIS — Z8659 Personal history of other mental and behavioral disorders: Secondary | ICD-10-CM | POA: Diagnosis not present

## 2012-03-13 DIAGNOSIS — F3289 Other specified depressive episodes: Secondary | ICD-10-CM | POA: Insufficient documentation

## 2012-03-13 DIAGNOSIS — Z8719 Personal history of other diseases of the digestive system: Secondary | ICD-10-CM | POA: Diagnosis not present

## 2012-03-13 DIAGNOSIS — F32A Depression, unspecified: Secondary | ICD-10-CM

## 2012-03-13 DIAGNOSIS — Z79899 Other long term (current) drug therapy: Secondary | ICD-10-CM | POA: Insufficient documentation

## 2012-03-13 LAB — URINE MICROSCOPIC-ADD ON

## 2012-03-13 LAB — URINALYSIS, ROUTINE W REFLEX MICROSCOPIC
Bilirubin Urine: NEGATIVE
Glucose, UA: NEGATIVE mg/dL
Hgb urine dipstick: NEGATIVE
Ketones, ur: NEGATIVE mg/dL
Nitrite: NEGATIVE
Protein, ur: NEGATIVE mg/dL
Specific Gravity, Urine: 1.012 (ref 1.005–1.030)
Urobilinogen, UA: 0.2 mg/dL (ref 0.0–1.0)
pH: 6.5 (ref 5.0–8.0)

## 2012-03-13 LAB — CBC WITH DIFFERENTIAL/PLATELET
Basophils Absolute: 0 10*3/uL (ref 0.0–0.1)
Basophils Relative: 0 % (ref 0–1)
Eosinophils Absolute: 0 10*3/uL (ref 0.0–0.7)
Eosinophils Relative: 1 % (ref 0–5)
HCT: 39.5 % (ref 36.0–46.0)
Hemoglobin: 13.8 g/dL (ref 12.0–15.0)
Lymphocytes Relative: 18 % (ref 12–46)
Lymphs Abs: 1.4 10*3/uL (ref 0.7–4.0)
MCH: 32 pg (ref 26.0–34.0)
MCHC: 34.9 g/dL (ref 30.0–36.0)
MCV: 91.6 fL (ref 78.0–100.0)
Monocytes Absolute: 0.5 10*3/uL (ref 0.1–1.0)
Monocytes Relative: 7 % (ref 3–12)
Neutro Abs: 5.8 10*3/uL (ref 1.7–7.7)
Neutrophils Relative %: 74 % (ref 43–77)
Platelets: 271 10*3/uL (ref 150–400)
RBC: 4.31 MIL/uL (ref 3.87–5.11)
RDW: 12.5 % (ref 11.5–15.5)
WBC: 7.8 10*3/uL (ref 4.0–10.5)

## 2012-03-13 LAB — BASIC METABOLIC PANEL
BUN: 10 mg/dL (ref 6–23)
CO2: 25 mEq/L (ref 19–32)
Calcium: 9.6 mg/dL (ref 8.4–10.5)
Chloride: 102 mEq/L (ref 96–112)
Creatinine, Ser: 0.54 mg/dL (ref 0.50–1.10)
GFR calc Af Amer: >90 mL/min (ref >90)
GFR calc non Af Amer: >90 mL/min (ref >90)
Glucose, Bld: 110 mg/dL — ABNORMAL HIGH (ref 70–99)
Potassium: 3.8 mEq/L (ref 3.5–5.1)
Sodium: 137 mEq/L (ref 135–145)

## 2012-03-13 LAB — POCT PREGNANCY, URINE: Preg Test, Ur: NEGATIVE

## 2012-03-13 LAB — RAPID URINE DRUG SCREEN, HOSP PERFORMED
Amphetamines: NOT DETECTED
Barbiturates: NOT DETECTED
Benzodiazepines: NOT DETECTED
Cocaine: NOT DETECTED
Opiates: NOT DETECTED
Tetrahydrocannabinol: NOT DETECTED

## 2012-03-13 MED ORDER — MECLIZINE HCL 25 MG PO TABS
25.0000 mg | ORAL_TABLET | Freq: Once | ORAL | Status: AC
  Administered 2012-03-13: 25 mg via ORAL
  Filled 2012-03-13: qty 1

## 2012-03-13 MED ORDER — ONDANSETRON HCL 4 MG PO TABS: 4.0000 mg | ORAL_TABLET | Freq: Four times a day (QID) | ORAL | Status: DC

## 2012-03-13 MED ORDER — ONDANSETRON 4 MG PO TBDP
4.0000 mg | ORAL_TABLET | Freq: Once | ORAL | Status: AC
  Administered 2012-03-13: 4 mg via ORAL
  Filled 2012-03-13: qty 1

## 2012-03-13 MED ORDER — ONDANSETRON HCL 4 MG/2ML IJ SOLN
4.0000 mg | Freq: Once | INTRAMUSCULAR | Status: AC
  Administered 2012-03-13: 4 mg via INTRAVENOUS
  Filled 2012-03-13: qty 2

## 2012-03-13 MED ORDER — SODIUM CHLORIDE 0.9 % IV BOLUS (SEPSIS)
1000.0000 mL | Freq: Once | INTRAVENOUS | Status: AC
  Administered 2012-03-13: 1000 mL via INTRAVENOUS

## 2012-03-13 MED ORDER — MECLIZINE HCL 25 MG PO TABS: 25.0000 mg | ORAL_TABLET | Freq: Three times a day (TID) | ORAL | Status: DC | PRN

## 2012-03-13 NOTE — ED Notes (Signed)
Pt states depression has been bad last few days; states today started feeling hot; nauseated; feels like something is wrong in her head; denies headache; c/o dizziness; ran out of depression meds for a few days--started back today; c/o of a lot of family stressors; lost a family friend; states " a lot of things she has been upset about"; states head feels bad; crying in triage; denies si/hi

## 2012-03-13 NOTE — ED Notes (Signed)
Discharge instructions reviewed. Rx given x2. All questions answered.  
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
## 2012-03-13 NOTE — ED Notes (Signed)
MD at bedside. 

## 2012-03-14 DIAGNOSIS — Z23 Encounter for immunization: Secondary | ICD-10-CM | POA: Diagnosis not present

## 2012-03-14 DIAGNOSIS — F3113 Bipolar disorder, current episode manic without psychotic features, severe: Secondary | ICD-10-CM | POA: Diagnosis not present

## 2012-03-18 DIAGNOSIS — J309 Allergic rhinitis, unspecified: Secondary | ICD-10-CM | POA: Diagnosis not present

## 2012-03-18 DIAGNOSIS — H6590 Unspecified nonsuppurative otitis media, unspecified ear: Secondary | ICD-10-CM | POA: Diagnosis not present

## 2012-03-18 DIAGNOSIS — R1031 Right lower quadrant pain: Secondary | ICD-10-CM | POA: Diagnosis not present

## 2012-03-20 DIAGNOSIS — F3113 Bipolar disorder, current episode manic without psychotic features, severe: Secondary | ICD-10-CM | POA: Diagnosis not present

## 2012-04-09 DIAGNOSIS — F3113 Bipolar disorder, current episode manic without psychotic features, severe: Secondary | ICD-10-CM | POA: Diagnosis not present

## 2012-04-13 ENCOUNTER — Other Ambulatory Visit: Payer: Self-pay

## 2012-04-17 DIAGNOSIS — J309 Allergic rhinitis, unspecified: Secondary | ICD-10-CM | POA: Diagnosis not present

## 2012-04-19 DIAGNOSIS — J309 Allergic rhinitis, unspecified: Secondary | ICD-10-CM | POA: Diagnosis not present

## 2012-04-24 DIAGNOSIS — J309 Allergic rhinitis, unspecified: Secondary | ICD-10-CM | POA: Diagnosis not present

## 2012-05-02 DIAGNOSIS — J309 Allergic rhinitis, unspecified: Secondary | ICD-10-CM | POA: Diagnosis not present

## 2012-05-07 DIAGNOSIS — F3113 Bipolar disorder, current episode manic without psychotic features, severe: Secondary | ICD-10-CM | POA: Diagnosis not present

## 2012-05-08 DIAGNOSIS — J309 Allergic rhinitis, unspecified: Secondary | ICD-10-CM | POA: Diagnosis not present

## 2012-05-21 DIAGNOSIS — J309 Allergic rhinitis, unspecified: Secondary | ICD-10-CM | POA: Diagnosis not present

## 2012-05-22 DIAGNOSIS — F3113 Bipolar disorder, current episode manic without psychotic features, severe: Secondary | ICD-10-CM | POA: Diagnosis not present

## 2012-05-29 DIAGNOSIS — K219 Gastro-esophageal reflux disease without esophagitis: Secondary | ICD-10-CM | POA: Diagnosis not present

## 2012-05-29 DIAGNOSIS — J309 Allergic rhinitis, unspecified: Secondary | ICD-10-CM | POA: Diagnosis not present

## 2012-05-29 DIAGNOSIS — H1045 Other chronic allergic conjunctivitis: Secondary | ICD-10-CM | POA: Diagnosis not present

## 2012-05-29 DIAGNOSIS — J301 Allergic rhinitis due to pollen: Secondary | ICD-10-CM | POA: Diagnosis not present

## 2012-05-29 DIAGNOSIS — J3089 Other allergic rhinitis: Secondary | ICD-10-CM | POA: Diagnosis not present

## 2012-05-29 DIAGNOSIS — J45909 Unspecified asthma, uncomplicated: Secondary | ICD-10-CM | POA: Diagnosis not present

## 2012-06-04 DIAGNOSIS — F3113 Bipolar disorder, current episode manic without psychotic features, severe: Secondary | ICD-10-CM | POA: Diagnosis not present

## 2012-06-05 DIAGNOSIS — J309 Allergic rhinitis, unspecified: Secondary | ICD-10-CM | POA: Diagnosis not present

## 2012-06-11 DIAGNOSIS — J309 Allergic rhinitis, unspecified: Secondary | ICD-10-CM | POA: Diagnosis not present

## 2012-06-18 DIAGNOSIS — J309 Allergic rhinitis, unspecified: Secondary | ICD-10-CM | POA: Diagnosis not present

## 2012-06-21 DIAGNOSIS — J309 Allergic rhinitis, unspecified: Secondary | ICD-10-CM | POA: Diagnosis not present

## 2012-06-26 DIAGNOSIS — J309 Allergic rhinitis, unspecified: Secondary | ICD-10-CM | POA: Diagnosis not present

## 2012-07-02 DIAGNOSIS — H5310 Unspecified subjective visual disturbances: Secondary | ICD-10-CM | POA: Diagnosis not present

## 2012-07-03 DIAGNOSIS — J309 Allergic rhinitis, unspecified: Secondary | ICD-10-CM | POA: Diagnosis not present

## 2012-07-09 DIAGNOSIS — K219 Gastro-esophageal reflux disease without esophagitis: Secondary | ICD-10-CM | POA: Diagnosis not present

## 2012-07-09 DIAGNOSIS — Z79899 Other long term (current) drug therapy: Secondary | ICD-10-CM | POA: Diagnosis not present

## 2012-07-09 DIAGNOSIS — K5901 Slow transit constipation: Secondary | ICD-10-CM | POA: Diagnosis not present

## 2012-07-09 DIAGNOSIS — J309 Allergic rhinitis, unspecified: Secondary | ICD-10-CM | POA: Diagnosis not present

## 2012-07-10 DIAGNOSIS — F3113 Bipolar disorder, current episode manic without psychotic features, severe: Secondary | ICD-10-CM | POA: Diagnosis not present

## 2012-07-16 DIAGNOSIS — J309 Allergic rhinitis, unspecified: Secondary | ICD-10-CM | POA: Diagnosis not present

## 2012-07-24 DIAGNOSIS — L819 Disorder of pigmentation, unspecified: Secondary | ICD-10-CM | POA: Diagnosis not present

## 2012-07-24 DIAGNOSIS — Z85828 Personal history of other malignant neoplasm of skin: Secondary | ICD-10-CM | POA: Diagnosis not present

## 2012-07-24 DIAGNOSIS — T148 Other injury of unspecified body region: Secondary | ICD-10-CM | POA: Diagnosis not present

## 2012-07-24 DIAGNOSIS — L570 Actinic keratosis: Secondary | ICD-10-CM | POA: Diagnosis not present

## 2012-07-24 DIAGNOSIS — J309 Allergic rhinitis, unspecified: Secondary | ICD-10-CM | POA: Diagnosis not present

## 2012-07-24 DIAGNOSIS — L719 Rosacea, unspecified: Secondary | ICD-10-CM | POA: Diagnosis not present

## 2012-07-30 DIAGNOSIS — W57XXXA Bitten or stung by nonvenomous insect and other nonvenomous arthropods, initial encounter: Secondary | ICD-10-CM | POA: Diagnosis not present

## 2012-07-30 DIAGNOSIS — M899 Disorder of bone, unspecified: Secondary | ICD-10-CM | POA: Diagnosis not present

## 2012-07-30 DIAGNOSIS — T148 Other injury of unspecified body region: Secondary | ICD-10-CM | POA: Diagnosis not present

## 2012-07-30 DIAGNOSIS — J309 Allergic rhinitis, unspecified: Secondary | ICD-10-CM | POA: Diagnosis not present

## 2012-07-30 DIAGNOSIS — M949 Disorder of cartilage, unspecified: Secondary | ICD-10-CM | POA: Diagnosis not present

## 2012-08-09 DIAGNOSIS — J309 Allergic rhinitis, unspecified: Secondary | ICD-10-CM | POA: Diagnosis not present

## 2012-08-09 DIAGNOSIS — F3113 Bipolar disorder, current episode manic without psychotic features, severe: Secondary | ICD-10-CM | POA: Diagnosis not present

## 2012-08-13 DIAGNOSIS — J45909 Unspecified asthma, uncomplicated: Secondary | ICD-10-CM | POA: Diagnosis not present

## 2012-08-13 DIAGNOSIS — H1045 Other chronic allergic conjunctivitis: Secondary | ICD-10-CM | POA: Diagnosis not present

## 2012-08-13 DIAGNOSIS — J301 Allergic rhinitis due to pollen: Secondary | ICD-10-CM | POA: Diagnosis not present

## 2012-08-23 ENCOUNTER — Encounter: Payer: Self-pay | Admitting: Gynecology

## 2012-08-23 ENCOUNTER — Ambulatory Visit (INDEPENDENT_AMBULATORY_CARE_PROVIDER_SITE_OTHER): Payer: Medicare Other | Admitting: Gynecology

## 2012-08-23 DIAGNOSIS — N949 Unspecified condition associated with female genital organs and menstrual cycle: Secondary | ICD-10-CM

## 2012-08-23 DIAGNOSIS — Z113 Encounter for screening for infections with a predominantly sexual mode of transmission: Secondary | ICD-10-CM | POA: Diagnosis not present

## 2012-08-23 DIAGNOSIS — R102 Pelvic and perineal pain: Secondary | ICD-10-CM

## 2012-08-23 DIAGNOSIS — J309 Allergic rhinitis, unspecified: Secondary | ICD-10-CM | POA: Diagnosis not present

## 2012-08-23 DIAGNOSIS — M549 Dorsalgia, unspecified: Secondary | ICD-10-CM

## 2012-08-23 LAB — URINALYSIS W MICROSCOPIC + REFLEX CULTURE
Bilirubin Urine: NEGATIVE
Glucose, UA: NEGATIVE mg/dL
Hgb urine dipstick: NEGATIVE
Ketones, ur: NEGATIVE mg/dL
Leukocytes, UA: NEGATIVE
Nitrite: NEGATIVE
Protein, ur: NEGATIVE mg/dL
Specific Gravity, Urine: 1.015 (ref 1.005–1.030)
Urobilinogen, UA: 0.2 mg/dL (ref 0.0–1.0)
pH: 7 (ref 5.0–8.0)

## 2012-08-23 NOTE — Patient Instructions (Signed)
Follow up for ultrasound as scheduled 

## 2012-08-23 NOTE — Progress Notes (Signed)
Patient presents with several different issues. She reports lower pelvic pain both right lower quadrant with some radiation across the abdomen. Also notes some right back pain. Having some bloating nausea and dizziness. Has been going on for the last several weeks. No fever chills dysuria frequency urgency constipation diarrhea.  Exam was Radiographer, therapeutic Spine straight with some tenderness over the right paraspinous area. No palpable masses or abnormalities. Abdomen soft nontender without masses guarding rebound organomegaly. Pelvic external BUS vagina normal. Cervix normal IUD string visualized. GC chlamydia done. Uterus retroverted normal size midline mobile nontender. Adnexa without masses or tenderness.  Assessment and plan: GI symptoms of bloating nausea lower abdominal pain. Exam is normal. GC chlamydia screening of cervix done particularly with IUD use. Urinalysis is negative Schedule GYN ultrasound to rule out ovarian process. Assuming negative then I suggest followup with GI. Low back pain with soreness in the muscle group. Recommend heat/nonsteroidal anti-inflammatory.

## 2012-08-24 LAB — GC/CHLAMYDIA PROBE AMP
CT Probe RNA: NEGATIVE
GC Probe RNA: NEGATIVE

## 2012-08-28 ENCOUNTER — Ambulatory Visit (INDEPENDENT_AMBULATORY_CARE_PROVIDER_SITE_OTHER): Payer: Medicare Other | Admitting: Gynecology

## 2012-08-28 ENCOUNTER — Ambulatory Visit (INDEPENDENT_AMBULATORY_CARE_PROVIDER_SITE_OTHER): Payer: Medicare Other

## 2012-08-28 ENCOUNTER — Encounter: Payer: Self-pay | Admitting: Gynecology

## 2012-08-28 DIAGNOSIS — R140 Abdominal distension (gaseous): Secondary | ICD-10-CM

## 2012-08-28 DIAGNOSIS — R141 Gas pain: Secondary | ICD-10-CM | POA: Diagnosis not present

## 2012-08-28 DIAGNOSIS — R102 Pelvic and perineal pain: Secondary | ICD-10-CM

## 2012-08-28 DIAGNOSIS — Z30431 Encounter for routine checking of intrauterine contraceptive device: Secondary | ICD-10-CM | POA: Diagnosis not present

## 2012-08-28 DIAGNOSIS — R143 Flatulence: Secondary | ICD-10-CM

## 2012-08-28 DIAGNOSIS — N949 Unspecified condition associated with female genital organs and menstrual cycle: Secondary | ICD-10-CM | POA: Diagnosis not present

## 2012-08-28 NOTE — Patient Instructions (Signed)
Followup with gastroenterology if your discomfort continues or worsens.

## 2012-08-28 NOTE — Progress Notes (Signed)
The patient presents in followup her ultrasound due to history of abdominal bloating nausea and abdominal cramping. Her exam was normal and my feeling was that it was primarily GI but we ordered an ultrasound just to rule out nonpalpable GYN pathology.  Ultrasound shows uterus to be normal size. IUD within normal position. Right and left ovaries visualized and normal. Cul-de-sac negative. Endometrial echo is 2.6 mm.  Assessment and plan: Abdominal bloating and cramping with nausea consistent with GI etiology. Patient will followup with gastroenterology if continues.

## 2012-08-29 DIAGNOSIS — K5901 Slow transit constipation: Secondary | ICD-10-CM | POA: Diagnosis not present

## 2012-08-29 DIAGNOSIS — F39 Unspecified mood [affective] disorder: Secondary | ICD-10-CM | POA: Diagnosis not present

## 2012-08-29 DIAGNOSIS — M899 Disorder of bone, unspecified: Secondary | ICD-10-CM | POA: Diagnosis not present

## 2012-08-29 DIAGNOSIS — G43909 Migraine, unspecified, not intractable, without status migrainosus: Secondary | ICD-10-CM | POA: Diagnosis not present

## 2012-08-29 DIAGNOSIS — Z0000 Encounter for general adult medical examination without abnormal findings: Secondary | ICD-10-CM | POA: Diagnosis not present

## 2012-08-29 DIAGNOSIS — D508 Other iron deficiency anemias: Secondary | ICD-10-CM | POA: Diagnosis not present

## 2012-08-29 DIAGNOSIS — K219 Gastro-esophageal reflux disease without esophagitis: Secondary | ICD-10-CM | POA: Diagnosis not present

## 2012-08-29 DIAGNOSIS — E7800 Pure hypercholesterolemia, unspecified: Secondary | ICD-10-CM | POA: Diagnosis not present

## 2012-08-29 DIAGNOSIS — F411 Generalized anxiety disorder: Secondary | ICD-10-CM | POA: Diagnosis not present

## 2012-08-29 DIAGNOSIS — M949 Disorder of cartilage, unspecified: Secondary | ICD-10-CM | POA: Diagnosis not present

## 2012-09-03 DIAGNOSIS — J309 Allergic rhinitis, unspecified: Secondary | ICD-10-CM | POA: Diagnosis not present

## 2012-09-06 DIAGNOSIS — J309 Allergic rhinitis, unspecified: Secondary | ICD-10-CM | POA: Diagnosis not present

## 2012-09-10 DIAGNOSIS — J309 Allergic rhinitis, unspecified: Secondary | ICD-10-CM | POA: Diagnosis not present

## 2012-09-12 DIAGNOSIS — J309 Allergic rhinitis, unspecified: Secondary | ICD-10-CM | POA: Diagnosis not present

## 2012-09-20 DIAGNOSIS — J309 Allergic rhinitis, unspecified: Secondary | ICD-10-CM | POA: Diagnosis not present

## 2012-09-20 DIAGNOSIS — F3113 Bipolar disorder, current episode manic without psychotic features, severe: Secondary | ICD-10-CM | POA: Diagnosis not present

## 2012-09-22 DIAGNOSIS — G43909 Migraine, unspecified, not intractable, without status migrainosus: Secondary | ICD-10-CM | POA: Diagnosis not present

## 2012-09-23 ENCOUNTER — Encounter: Payer: Medicare Other | Admitting: Gynecology

## 2012-09-26 DIAGNOSIS — J309 Allergic rhinitis, unspecified: Secondary | ICD-10-CM | POA: Diagnosis not present

## 2012-09-27 DIAGNOSIS — J309 Allergic rhinitis, unspecified: Secondary | ICD-10-CM | POA: Diagnosis not present

## 2012-10-10 DIAGNOSIS — R109 Unspecified abdominal pain: Secondary | ICD-10-CM | POA: Diagnosis not present

## 2012-10-10 DIAGNOSIS — J309 Allergic rhinitis, unspecified: Secondary | ICD-10-CM | POA: Diagnosis not present

## 2012-10-10 DIAGNOSIS — M542 Cervicalgia: Secondary | ICD-10-CM | POA: Diagnosis not present

## 2012-10-11 ENCOUNTER — Emergency Department (HOSPITAL_COMMUNITY): Payer: Medicare Other

## 2012-10-11 ENCOUNTER — Inpatient Hospital Stay (HOSPITAL_COMMUNITY)
Admission: EM | Admit: 2012-10-11 | Discharge: 2012-10-18 | DRG: 418 | Disposition: A | Discharge status: Home / Self Care | Payer: Medicare Other | Attending: Surgery | Admitting: Surgery

## 2012-10-11 ENCOUNTER — Encounter (HOSPITAL_COMMUNITY): Payer: Self-pay | Admitting: Emergency Medicine

## 2012-10-11 ENCOUNTER — Encounter: Payer: Medicare Other | Admitting: Gynecology

## 2012-10-11 DIAGNOSIS — N946 Dysmenorrhea, unspecified: Secondary | ICD-10-CM | POA: Diagnosis present

## 2012-10-11 DIAGNOSIS — F17200 Nicotine dependence, unspecified, uncomplicated: Secondary | ICD-10-CM | POA: Diagnosis present

## 2012-10-11 DIAGNOSIS — F411 Generalized anxiety disorder: Secondary | ICD-10-CM | POA: Diagnosis present

## 2012-10-11 DIAGNOSIS — Z975 Presence of (intrauterine) contraceptive device: Secondary | ICD-10-CM | POA: Diagnosis not present

## 2012-10-11 DIAGNOSIS — K8010 Calculus of gallbladder with chronic cholecystitis without obstruction: Principal | ICD-10-CM | POA: Diagnosis present

## 2012-10-11 DIAGNOSIS — D72829 Elevated white blood cell count, unspecified: Secondary | ICD-10-CM | POA: Diagnosis present

## 2012-10-11 DIAGNOSIS — K5901 Slow transit constipation: Secondary | ICD-10-CM | POA: Diagnosis present

## 2012-10-11 DIAGNOSIS — R1084 Generalized abdominal pain: Secondary | ICD-10-CM | POA: Diagnosis not present

## 2012-10-11 DIAGNOSIS — F3189 Other bipolar disorder: Secondary | ICD-10-CM | POA: Diagnosis present

## 2012-10-11 DIAGNOSIS — K5900 Constipation, unspecified: Secondary | ICD-10-CM | POA: Diagnosis present

## 2012-10-11 DIAGNOSIS — K660 Peritoneal adhesions (postprocedural) (postinfection): Secondary | ICD-10-CM | POA: Diagnosis not present

## 2012-10-11 DIAGNOSIS — H9190 Unspecified hearing loss, unspecified ear: Secondary | ICD-10-CM | POA: Diagnosis present

## 2012-10-11 DIAGNOSIS — R1031 Right lower quadrant pain: Secondary | ICD-10-CM | POA: Diagnosis not present

## 2012-10-11 DIAGNOSIS — R109 Unspecified abdominal pain: Secondary | ICD-10-CM

## 2012-10-11 DIAGNOSIS — K811 Chronic cholecystitis: Secondary | ICD-10-CM | POA: Diagnosis not present

## 2012-10-11 DIAGNOSIS — Z860100 Personal history of colon polyps, unspecified: Secondary | ICD-10-CM

## 2012-10-11 DIAGNOSIS — K219 Gastro-esophageal reflux disease without esophagitis: Secondary | ICD-10-CM | POA: Diagnosis present

## 2012-10-11 DIAGNOSIS — G8929 Other chronic pain: Secondary | ICD-10-CM | POA: Diagnosis present

## 2012-10-11 DIAGNOSIS — F32A Depression, unspecified: Secondary | ICD-10-CM | POA: Diagnosis present

## 2012-10-11 DIAGNOSIS — K589 Irritable bowel syndrome without diarrhea: Secondary | ICD-10-CM | POA: Diagnosis present

## 2012-10-11 DIAGNOSIS — R110 Nausea: Secondary | ICD-10-CM | POA: Diagnosis not present

## 2012-10-11 DIAGNOSIS — F3181 Bipolar II disorder: Secondary | ICD-10-CM | POA: Diagnosis present

## 2012-10-11 DIAGNOSIS — F329 Major depressive disorder, single episode, unspecified: Secondary | ICD-10-CM | POA: Diagnosis present

## 2012-10-11 DIAGNOSIS — K8020 Calculus of gallbladder without cholecystitis without obstruction: Secondary | ICD-10-CM | POA: Diagnosis not present

## 2012-10-11 LAB — CBC
HCT: 41.5 % (ref 36.0–46.0)
Hemoglobin: 14.1 g/dL (ref 12.0–15.0)
MCH: 31.1 pg (ref 26.0–34.0)
MCHC: 34 g/dL (ref 30.0–36.0)
MCV: 91.6 fL (ref 78.0–100.0)
Platelets: 125 10*3/uL — ABNORMAL LOW (ref 150–400)
RBC: 4.53 MIL/uL (ref 3.87–5.11)
RDW: 12.6 % (ref 11.5–15.5)
WBC: 5.1 10*3/uL (ref 4.0–10.5)

## 2012-10-11 LAB — URINALYSIS, ROUTINE W REFLEX MICROSCOPIC
Bilirubin Urine: NEGATIVE
Glucose, UA: NEGATIVE mg/dL
Hgb urine dipstick: NEGATIVE
Ketones, ur: NEGATIVE mg/dL
Leukocytes, UA: NEGATIVE
Nitrite: NEGATIVE
Protein, ur: NEGATIVE mg/dL
Specific Gravity, Urine: 1.011 (ref 1.005–1.030)
Urobilinogen, UA: 0.2 mg/dL (ref 0.0–1.0)
pH: 7.5 (ref 5.0–8.0)

## 2012-10-11 LAB — HEPATIC FUNCTION PANEL
ALT: 35 U/L (ref 0–35)
AST: 32 U/L (ref 0–37)
Albumin: 4.1 g/dL (ref 3.5–5.2)
Alkaline Phosphatase: 57 U/L (ref 39–117)
Bilirubin, Direct: 0.1 mg/dL (ref 0.0–0.3)
Indirect Bilirubin: 0.4 mg/dL (ref 0.3–0.9)
Total Bilirubin: 0.5 mg/dL (ref 0.3–1.2)
Total Protein: 6.9 g/dL (ref 6.0–8.3)

## 2012-10-11 LAB — BASIC METABOLIC PANEL
BUN: 13 mg/dL (ref 6–23)
CO2: 23 mEq/L (ref 19–32)
Calcium: 9.7 mg/dL (ref 8.4–10.5)
Chloride: 106 mEq/L (ref 96–112)
Creatinine, Ser: 0.63 mg/dL (ref 0.50–1.10)
GFR calc Af Amer: >90 mL/min (ref >90)
GFR calc non Af Amer: >90 mL/min (ref >90)
Glucose, Bld: 100 mg/dL — ABNORMAL HIGH (ref 70–99)
Potassium: 4.4 mEq/L (ref 3.5–5.1)
Sodium: 140 mEq/L (ref 135–145)

## 2012-10-11 LAB — POCT PREGNANCY, URINE: Preg Test, Ur: NEGATIVE

## 2012-10-11 LAB — CG4 I-STAT (LACTIC ACID): Lactic Acid, Venous: 0.82 mmol/L (ref 0.5–2.2)

## 2012-10-11 LAB — WET PREP, GENITAL
Clue Cells Wet Prep HPF POC: NONE SEEN
Trich, Wet Prep: NONE SEEN
Yeast Wet Prep HPF POC: NONE SEEN

## 2012-10-11 MED ORDER — LORATADINE 10 MG PO TABS
10.0000 mg | ORAL_TABLET | Freq: Every day | ORAL | Status: DC
  Administered 2012-10-11 – 2012-10-17 (×7): 10 mg via ORAL
  Filled 2012-10-11 (×8): qty 1

## 2012-10-11 MED ORDER — ONDANSETRON HCL 4 MG/2ML IJ SOLN
4.0000 mg | Freq: Once | INTRAMUSCULAR | Status: AC
  Administered 2012-10-11: 4 mg via INTRAVENOUS
  Filled 2012-10-11: qty 2

## 2012-10-11 MED ORDER — LORAZEPAM 1 MG PO TABS
1.0000 mg | ORAL_TABLET | Freq: Three times a day (TID) | ORAL | Status: DC | PRN
  Administered 2012-10-12 – 2012-10-17 (×12): 1 mg via ORAL
  Filled 2012-10-11 (×13): qty 1

## 2012-10-11 MED ORDER — ONDANSETRON HCL 4 MG/2ML IJ SOLN
4.0000 mg | Freq: Four times a day (QID) | INTRAMUSCULAR | Status: DC | PRN
  Administered 2012-10-12 – 2012-10-17 (×15): 4 mg via INTRAVENOUS
  Filled 2012-10-11 (×16): qty 2

## 2012-10-11 MED ORDER — SENNA 8.6 MG PO TABS
1.0000 | ORAL_TABLET | Freq: Every day | ORAL | Status: DC
  Administered 2012-10-11 – 2012-10-13 (×3): 8.6 mg via ORAL
  Filled 2012-10-11 (×3): qty 1

## 2012-10-11 MED ORDER — LIOTHYRONINE SODIUM 25 MCG PO TABS
25.0000 ug | ORAL_TABLET | Freq: Every day | ORAL | Status: DC
  Administered 2012-10-12 – 2012-10-18 (×6): 25 ug via ORAL
  Filled 2012-10-11 (×7): qty 1

## 2012-10-11 MED ORDER — DIPHENHYDRAMINE HCL 25 MG PO CAPS
25.0000 mg | ORAL_CAPSULE | Freq: Four times a day (QID) | ORAL | Status: DC | PRN
  Administered 2012-10-11 – 2012-10-12 (×2): 25 mg via ORAL
  Filled 2012-10-11 (×2): qty 1

## 2012-10-11 MED ORDER — FLUVOXAMINE MALEATE 50 MG PO TABS
50.0000 mg | ORAL_TABLET | Freq: Every day | ORAL | Status: DC
  Administered 2012-10-11 – 2012-10-17 (×7): 50 mg via ORAL
  Filled 2012-10-11 (×8): qty 1

## 2012-10-11 MED ORDER — ALBUTEROL SULFATE HFA 108 (90 BASE) MCG/ACT IN AERS
2.0000 | INHALATION_SPRAY | Freq: Four times a day (QID) | RESPIRATORY_TRACT | Status: DC | PRN
  Filled 2012-10-11: qty 6.7

## 2012-10-11 MED ORDER — LAMOTRIGINE 200 MG PO TABS
200.0000 mg | ORAL_TABLET | Freq: Every day | ORAL | Status: DC
  Administered 2012-10-11 – 2012-10-17 (×7): 200 mg via ORAL
  Filled 2012-10-11 (×8): qty 1

## 2012-10-11 MED ORDER — LEVOCETIRIZINE DIHYDROCHLORIDE 5 MG PO TABS: 5.0000 mg | ORAL_TABLET | Freq: Every evening | ORAL | Status: DC

## 2012-10-11 MED ORDER — HYDROMORPHONE HCL PF 1 MG/ML IJ SOLN
0.5000 mg | INTRAMUSCULAR | Status: DC | PRN
  Administered 2012-10-11 – 2012-10-17 (×25): 0.5 mg via INTRAVENOUS
  Filled 2012-10-11 (×25): qty 1

## 2012-10-11 MED ORDER — POLYETHYLENE GLYCOL 3350 17 G PO PACK
17.0000 g | PACK | Freq: Two times a day (BID) | ORAL | Status: DC | PRN
  Administered 2012-10-12: 17 g via ORAL
  Filled 2012-10-11 (×2): qty 1

## 2012-10-11 MED ORDER — DIPHENHYDRAMINE HCL 50 MG/ML IJ SOLN
25.0000 mg | Freq: Once | INTRAMUSCULAR | Status: AC
  Administered 2012-10-11: 25 mg via INTRAVENOUS
  Filled 2012-10-11: qty 1

## 2012-10-11 MED ORDER — DOCUSATE SODIUM 100 MG PO CAPS
100.0000 mg | ORAL_CAPSULE | Freq: Two times a day (BID) | ORAL | Status: DC | PRN
  Administered 2012-10-12 – 2012-10-13 (×2): 100 mg via ORAL
  Filled 2012-10-11: qty 1

## 2012-10-11 MED ORDER — KCL IN DEXTROSE-NACL 20-5-0.45 MEQ/L-%-% IV SOLN
INTRAVENOUS | Status: DC
  Administered 2012-10-11 – 2012-10-13 (×4): via INTRAVENOUS
  Administered 2012-10-13: 75 mL/h via INTRAVENOUS
  Administered 2012-10-14 – 2012-10-15 (×3): via INTRAVENOUS
  Administered 2012-10-16: 75 mL/h via INTRAVENOUS
  Administered 2012-10-16 – 2012-10-17 (×2): via INTRAVENOUS
  Filled 2012-10-11 (×13): qty 1000

## 2012-10-11 MED ORDER — IOHEXOL 300 MG/ML  SOLN
80.0000 mL | Freq: Once | INTRAMUSCULAR | Status: AC | PRN
  Administered 2012-10-11: 80 mL via INTRAVENOUS

## 2012-10-11 MED ORDER — GABAPENTIN 300 MG PO CAPS
600.0000 mg | ORAL_CAPSULE | Freq: Two times a day (BID) | ORAL | Status: DC
  Administered 2012-10-11 – 2012-10-18 (×13): 600 mg via ORAL
  Filled 2012-10-11 (×15): qty 2

## 2012-10-11 MED ORDER — PANTOPRAZOLE SODIUM 40 MG PO TBEC
80.0000 mg | DELAYED_RELEASE_TABLET | Freq: Every day | ORAL | Status: DC
  Administered 2012-10-11 – 2012-10-15 (×5): 80 mg via ORAL
  Filled 2012-10-11 (×6): qty 2

## 2012-10-11 MED ORDER — LORAZEPAM 2 MG/ML IJ SOLN
1.0000 mg | Freq: Once | INTRAMUSCULAR | Status: AC
  Administered 2012-10-11: 1 mg via INTRAVENOUS
  Filled 2012-10-11: qty 1

## 2012-10-11 MED ORDER — MONTELUKAST SODIUM 10 MG PO TABS
10.0000 mg | ORAL_TABLET | Freq: Every day | ORAL | Status: DC
  Administered 2012-10-11 – 2012-10-17 (×7): 10 mg via ORAL
  Filled 2012-10-11 (×8): qty 1

## 2012-10-11 MED ORDER — HYDROMORPHONE HCL PF 1 MG/ML IJ SOLN
1.0000 mg | Freq: Once | INTRAMUSCULAR | Status: AC
  Administered 2012-10-11: 1 mg via INTRAVENOUS
  Filled 2012-10-11: qty 1

## 2012-10-11 MED ORDER — IOHEXOL 300 MG/ML  SOLN
50.0000 mL | Freq: Once | INTRAMUSCULAR | Status: AC | PRN
  Administered 2012-10-11: 50 mL via ORAL

## 2012-10-11 MED ORDER — HYDROMORPHONE HCL PF 1 MG/ML IJ SOLN
0.5000 mg | Freq: Once | INTRAMUSCULAR | Status: AC
  Administered 2012-10-11: 0.5 mg via INTRAVENOUS
  Filled 2012-10-11: qty 1

## 2012-10-11 MED ORDER — TOPIRAMATE 25 MG PO TABS
50.0000 mg | ORAL_TABLET | Freq: Every day | ORAL | Status: DC
  Administered 2012-10-11 – 2012-10-17 (×7): 50 mg via ORAL
  Filled 2012-10-11 (×8): qty 2

## 2012-10-11 MED ORDER — TRAZODONE HCL 50 MG PO TABS
50.0000 mg | ORAL_TABLET | Freq: Every day | ORAL | Status: DC
  Administered 2012-10-11 – 2012-10-17 (×7): 50 mg via ORAL
  Filled 2012-10-11 (×8): qty 1

## 2012-10-11 MED ORDER — HEPARIN SODIUM (PORCINE) 5000 UNIT/ML IJ SOLN
5000.0000 [IU] | Freq: Three times a day (TID) | INTRAMUSCULAR | Status: DC
  Administered 2012-10-11 – 2012-10-16 (×14): 5000 [IU] via SUBCUTANEOUS
  Filled 2012-10-11 (×17): qty 1

## 2012-10-11 NOTE — ED Notes (Signed)
Per pt, lower/mid abdominal pain for 3 days-went to PCP and was worked up for possible ovarian cyst-urine was clean-increased pain last night

## 2012-10-11 NOTE — ED Provider Notes (Signed)
General surgery, Dr Biagio Quint, has evaluated pt, and feels is v low risk for having appendicitis - as pts pain continues and possibility of appendicitis not 0, he recommends admit to med service for observation re persistent abd pain and nausea.  I discussed with him that we will discuss w med service and that they can talk directly to each other if questions/concerns regarding admitting service. On recheck pt resting, lower abd tenderness, no rebound or guarding.  zofran iv. Dilaudid .5 mg iv    Suzi Roots, MD 10/11/12 [phone]

## 2012-10-11 NOTE — Evaluation (Signed)
Received call from EDP about 52 year old female with 3 days of progressive right lower quadrant and generalized abdominal pain in the setting of chronic abdominal pain. Lab work otherwise within normal limits. A CT scan was obtained that showed some questionable changes for appendicitis. Surgery is been a consult and then feels that patient is at fairly low risk for appendicitis and recommend observation. Discuss with Dr. Biagio Quint from surgery that there is very little that we can offer from a medical standpoint given the patient's current symptoms and possible imaging findings. However, if clinical picture is unchanged within the next 24-48 hours, consultation with [place] or gastroenterology may be of benefit. Dr. Biagio Quint is agreeable to placing her on the surgical service for observation. Will followup as needed.

## 2012-10-11 NOTE — ED Notes (Signed)
Pt had a merena IUD in place. Pt does not have periods with his IUD.

## 2012-10-11 NOTE — Consult Note (Signed)
Reason for Consult:RLQ abdominal pain Referring Physician: Dr. 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  Location is pelvic region.  Onset was 3 days ago.  Coarse is worsening.  Associated with nausea.  Characterized as constant "band like" pain which she initially thought was from menstrual cramps(has IUD and does not have periods).  No aggravating factors.  Alleviating factors; fetal position and minimized with pain medication.  She denies vomiting, diarrhea or constipation.  Last BM was last night.  She has not been sexually active in 2 years.  Denies vaginal discharge or dysuria.  She has a history of ovarian cysts.  She was seen by PCP yesterday for this, UA was done.  She was very lethargic, but easily arousable.  Her mother is at bedside.  Past Medical History  Diagnosis Date  . GERD (gastroesophageal reflux disease)   . 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 Migraines   . Bipolar 2 disorder   . IBS (irritable bowel syndrome)   . Constipation   . ASCUS (atypical squamous cells of undetermined significance) on Pap smear 05/06/05    NEG HIGH RISK HPV--C&B BIOPSY BENIGN 12/2005  . Anxiety   . 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    cytology negative    Past Surgical History  Procedure Laterality Date  . Hemorrhoid surgery  1993    x3  . Cesarean section  [id number]  . Shoulder surgery  2007/2008  . Spine surgery  2010    cervical  . Dilation and curettage of uterus    . Pelvic laparoscopy    . Intrauterine device insertion  01/04/2010    MIRENA    Family History  Problem Relation Age of Onset  . Diabetes Father   . Hypertension Father   . Hyperlipidemia Father   . Heart disease Maternal Grandfather   . Heart disease Paternal Grandmother   . Heart disease Paternal Grandfather     Social History:   reports that she has been smoking Cigarettes.  She has been smoking about 0.00 packs per day. She has never used smokeless tobacco. She reports that  drinks alcohol. She reports that she does not use illicit drugs.  Allergies:  Allergies  Allergen Reactions  . Sulfa Antibiotics     Causes pt to vomit blood  . Flagyl [Metronidazole Hcl] Nausea And Vomiting  . Morphine And Related     Depression    Medications: {medication reviewed  Results for orders placed during the hospital encounter of 10/11/12 (from the past 48 hour(s))  CBC     Status: Abnormal   Collection Time    10/11/12  1:09 PM      Result Value Range   WBC 5.1  4.0 - 10.5 K/uL   RBC 4.53  3.87 - 5.11 MIL/uL   Hemoglobin 14.1  12.0 - 15.0 g/dL   HCT 16.1  09.6 - 04.5 %   MCV 91.6  78.0 - 100.0 fL   MCH 31.1  26.0 - 34.0 pg   MCHC 34.0  30.0 - 36.0 g/dL   RDW 40.9  81.1 - 91.4 %   Platelets 125 (*) 150 - 400 K/uL  BASIC METABOLIC PANEL     Status: Abnormal   Collection Time    10/11/12  1:09 PM      Result Value  Range   Sodium 140  135 - 145 mEq/L   Potassium 4.4  3.5 - 5.1 mEq/L   Comment: SLIGHT HEMOLYSIS     HEMOLYSIS AT THIS LEVEL MAY AFFECT RESULT   Chloride 106  96 - 112 mEq/L   CO2 23  19 - 32 mEq/L   Glucose, Bld 100 (*) 70 - 99 mg/dL   BUN 13  6 - 23 mg/dL   Creatinine, Ser 1.47  0.50 - 1.10 mg/dL   Calcium 9.7  8.4 - 82.9 mg/dL   GFR calc non Af Amer >90  >90 mL/min   GFR calc Af Amer >90  >90 mL/min   Comment: (NOTE)     The eGFR has been calculated using the CKD EPI equation.     This calculation has not been validated in all clinical situations.     eGFR's persistently <90 mL/min signify possible Chronic Kidney     Disease.  HEPATIC FUNCTION PANEL     Status: None   Collection Time    10/11/12  1:09 PM      Result Value Range   Total Protein 6.9  6.0 - 8.3 g/dL   Albumin 4.1  3.5 - 5.2 g/dL   AST 32  0 - 37 U/L   ALT 35  0 - 35 U/L   Alkaline Phosphatase 57  39 - 117 U/L   Total  Bilirubin 0.5  0.3 - 1.2 mg/dL   Bilirubin, Direct 0.1  0.0 - 0.3 mg/dL   Indirect Bilirubin 0.4  0.3 - 0.9 mg/dL  WET PREP, GENITAL     Status: Abnormal   Collection Time    10/11/12  1:17 PM      Result Value Range   Yeast Wet Prep HPF POC NONE SEEN  NONE SEEN   Trich, Wet Prep NONE SEEN  NONE SEEN   Clue Cells Wet Prep HPF POC NONE SEEN  NONE SEEN   WBC, Wet Prep HPF POC FEW (*) NONE SEEN  CG4 I-STAT (LACTIC ACID)     Status: None   Collection Time    10/11/12  1:24 PM      Result Value Range   Lactic Acid, Venous 0.82  0.5 - 2.2 mmol/L  URINALYSIS, ROUTINE W REFLEX MICROSCOPIC     Status: None   Collection Time    10/11/12  2:45 PM      Result Value Range   Color, Urine YELLOW  YELLOW   APPearance CLEAR  CLEAR   Specific Gravity, Urine 1.011  1.005 - 1.030   pH 7.5  5.0 - 8.0   Glucose, UA NEGATIVE  NEGATIVE mg/dL   Hgb urine dipstick NEGATIVE  NEGATIVE   Bilirubin Urine NEGATIVE  NEGATIVE   Ketones, ur NEGATIVE  NEGATIVE mg/dL   Protein, ur NEGATIVE  NEGATIVE mg/dL   Urobilinogen, UA 0.2  0.0 - 1.0 mg/dL   Nitrite NEGATIVE  NEGATIVE   Leukocytes, UA NEGATIVE  NEGATIVE   Comment: MICROSCOPIC NOT DONE ON URINES WITH NEGATIVE PROTEIN, BLOOD, LEUKOCYTES, NITRITE, OR GLUCOSE <1000 mg/dL.    Ct Abdomen Pelvis W Contrast  10/11/2012   *RADIOLOGY REPORT*  Clinical Data: right lower quadrant and suprapubic pain  CT ABDOMEN AND PELVIS WITH CONTRAST  Technique:  Multidetector CT imaging of the abdomen and pelvis was performed following the standard protocol during bolus administration of intravenous contrast.  Contrast: 50mL OMNIPAQUE IOHEXOL 300 MG/ML  SOLN, 80mL OMNIPAQUE IOHEXOL 300 MG/ML  SOLN  Comparison: 11/11/10  Findings:  The lung bases are clear.  There are no acute musculoskeletal findings.  Liver, gallbladder, spleen, pancreas, kidneys, and adrenal glands are normal.  Bowel is normal.  Bladder appears normal.  There is an intrauterine device in anticipated position.  There are  no adnexal masses.  There is no free fluid.  The origin of the appendix of the cecum appears normal, with the appendix measuring about 3 mm in diameter and opacifying with contrast.  The tip of the appendix however dilates to 7 mm and shows very mildly indistinct margins with no contrast within it.  IMPRESSION: The findings are not entirely specific for appendicitis.  However, in the appropriate clinical setting, the possibility of early or mild inflammatory change involving only the distal tip of the appendix is a consideration based on the CT appearance.   Original Report Authenticated By: Esperanza Heir, M.D.    Review of Systems  Constitutional: Positive for malaise/fatigue. Negative for fever and chills.  Eyes: Negative for blurred vision and photophobia.  Respiratory: Negative for cough, shortness of breath and wheezing.   Cardiovascular: Negative for chest pain and palpitations.  Gastrointestinal: Positive for nausea. Negative for vomiting, diarrhea, constipation, blood in stool and melena.  Genitourinary: Negative for dysuria.  Neurological: Positive for headaches. Negative for dizziness, sensory change, speech change, focal weakness, seizures, loss of consciousness and weakness.  Psychiatric/Behavioral: Positive for depression. Negative for substance abuse. The patient is nervous/anxious.    Blood pressure 116/79, pulse 78, temperature 98 F (36.7 C), temperature source Oral, resp. rate 18, SpO2 97.00%. Physical Exam  Constitutional: She is oriented to person, place, and time. She appears well-developed and well-nourished. No distress.  Neck: Normal range of motion. Neck supple. No thyromegaly present.  Cardiovascular: Normal rate, regular rhythm and normal heart sounds.  Exam reveals no gallop and no friction rub.   No murmur heard. Respiratory: Effort normal and breath sounds normal. No respiratory distress. She has no wheezes. She has no rales. She exhibits no tenderness.  GI: Soft.  Bowel sounds are normal. She exhibits no distension and no mass. There is no rebound.  RLQ on exam, negative rebound tenderness and without evidence of peritonitis.    Musculoskeletal: She exhibits no edema and no tenderness.  Lymphadenopathy:    She has no cervical adenopathy.  Neurological: She is alert and oriented to person, place, and time.  Skin: Skin is dry. No rash noted. She is not diaphoretic. No erythema. No pallor.  Psychiatric: She has a normal mood and affect. Her behavior is normal. Judgment and thought content normal.    Assessment/Plan: RLQ abdominal pain x3 days: she has a normal white count, but tender to RLQ, CT is inconclusive.  Observation without antimicrobial therapy versus diagnostic laparoscopy.  Dr. Biagio Quint to discuss with the patient to determine the most appropriate plan of care.    Ashok Norris ANP-BC Pager [phone]  10/11/2012, 4:25 PM   I have seen and evaluated this patient. She has 3 days of worsening diffuse abdominal pain which is greatest in the periumbilical region. She has nausea but no vomiting. Her abdominal exam is mildly but diffusely tender without any guarding or rebound or peritoneal signs. She is not very tender in the right lower quadrant. Her vitals are normal and her laboratory studies are unremarkable as well. Her CT scan was equivocal for some appendiceal changes or possible appendicitis.8 really doubt that this is acute appendicitis. I would expect a more focal exam and one might expect some more significant  inflammatory changes on CT scan and likely a leukocytosis since this has been going on for 3 days now. I explained to the patient and her mother that I cannot completely rule out appendicitis and this is certainly still a possibility but the evidence does not point to this right now. I discussed with her the options for diagnostic laparoscopy and appendectomy versus overnight observation to see if this progresses and possible appendectomy  tomorrow if her symptoms worsen. We discussed the pros and cons of each option. At this point, I would recommend overnight observation and repeat laboratory studies and abdominal exam tomorrow. If her symptoms do not improve then I would consider diagnostic laparoscopy with the risk that this might be a negative exam.  I would also recommend GYN consultation for possible ovarian cyst.

## 2012-10-11 NOTE — ED Provider Notes (Signed)
CSN: [id number]     Arrival date & time 10/11/12  1138 History     First MD Initiated Contact with Patient 10/11/12 1204     Chief Complaint  Patient presents with  . Abdominal Pain   (Consider location/radiation/quality/duration/timing/severity/associated sxs/prior Treatment) HPI Comments: Saw PCP yesterday, had normal urine. No imaging obtained.  Patient is a 52 y.o. female presenting with abdominal pain. The history is provided by the patient.  Abdominal Pain Pain location:  RLQ Pain quality: dull   Pain quality: not burning, not tearing and not throbbing   Pain radiates to:  Back Pain severity:  No pain Onset quality:  Sudden Timing:  Constant Progression:  Unchanged Chronicity:  Chronic (this pain is worse than her chronic pain) Context: previous surgery (3 C-sections)   Context: not diet changes, not eating and not sick contacts   Relieved by:  Nothing Worsened by:  Nothing tried Ineffective treatments:  None tried Associated symptoms: nausea   Associated symptoms: no anorexia, no belching, no fever, no vaginal bleeding, no vaginal discharge and no vomiting     Past Medical History  Diagnosis Date  . GERD (gastroesophageal reflux disease)   . 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 Migraines   . Bipolar 2 disorder   . IBS (irritable bowel syndrome)   . Constipation   . ASCUS (atypical squamous cells of undetermined significance) on Pap smear 05/06/05    NEG HIGH RISK HPV--C&B BIOPSY BENIGN 12/2005  . Anxiety   . 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    cytology negative   Past Surgical History  Procedure Laterality Date  . Hemorrhoid surgery  1993    x3  . Cesarean section  [id number]  . Shoulder surgery  2007/2008  . Spine surgery  2010    cervical  . Dilation and curettage of uterus    . Pelvic laparoscopy    . Intrauterine device insertion  01/04/2010    MIRENA   Family History  Problem Relation Age of Onset  . Diabetes Father   . Hypertension Father   .  Hyperlipidemia Father   . Heart disease Maternal Grandfather   . Heart disease Paternal Grandmother   . Heart disease Paternal Grandfather    History  Substance Use Topics  . Smoking status: Current Some Day Smoker    Types: Cigarettes  . Smokeless tobacco: Never Used  . Alcohol Use: Yes     Comment: rare   OB History   Grav Para Term Preterm Abortions TAB SAB Ect Mult Living   4 3 3  1  1   3      Review of Systems  Constitutional: Negative for fever.  Gastrointestinal: Positive for nausea and abdominal pain. Negative for vomiting and anorexia.  Genitourinary: Negative for vaginal bleeding and vaginal discharge.  All other systems reviewed and are negative.    Allergies  Sulfa antibiotics; Flagyl; and Morphine and related  Home Medications   Current Outpatient Rx  Name  Route  Sig  Dispense  Refill  . albuterol (PROVENTIL HFA;VENTOLIN HFA) 108 (90 BASE) MCG/ACT inhaler   Inhalation   Inhale 2 puffs into the lungs every 6 (six) hours as needed for wheezing or shortness of breath.          . beclomethasone (QVAR) 40 MCG/ACT inhaler   Inhalation   Inhale 2 puffs into the lungs 2 (two) times daily.         . Calcium Carbonate-Vitamin D (CALCIUM + D PO)  Oral   Take 1 tablet by mouth daily.          Marland Kitchen CRANBERRY EXTRACT PO   Oral   Take 1 capsule by mouth daily.         . diphenhydrAMINE (BENADRYL) 25 mg capsule   Oral   Take 50 mg by mouth every 6 (six) hours as needed for itching or allergies.          Marland Kitchen docusate sodium (COLACE) 100 MG capsule   Oral   Take 100 mg by mouth 2 (two) times daily as needed for constipation.          Marland Kitchen esomeprazole (NEXIUM) 40 MG capsule   Oral   Take 40 mg by mouth 2 (two) times daily.         . fexofenadine (ALLEGRA) 180 MG tablet   Oral   Take 180 mg by mouth daily.         . fluvoxaMINE (LUVOX) 50 MG tablet   Oral   Take 50 mg by mouth at bedtime.         . gabapentin (NEURONTIN) 300 MG capsule    Oral   Take 600 mg by mouth 2 (two) times daily.         Marland Kitchen lamoTRIgine (LAMICTAL) 200 MG tablet   Oral   Take 200 mg by mouth at bedtime.         Marland Kitchen levocetirizine (XYZAL) 5 MG tablet   Oral   Take 5 mg by mouth every evening.         Marland Kitchen liothyronine (CYTOMEL) 25 MCG tablet   Oral   Take 25 mcg by mouth daily.         Marland Kitchen LORazepam (ATIVAN) 1 MG tablet   Oral   Take 1 mg by mouth every 8 (eight) hours as needed for anxiety.          . meclizine (ANTIVERT) 25 MG tablet   Oral   Take 25 mg by mouth 3 (three) times daily as needed for dizziness.         . Melatonin 5 MG CAPS   Oral   Take 1 capsule by mouth at bedtime as needed (for sleep).         . montelukast (SINGULAIR) 10 MG tablet   Oral   Take 10 mg by mouth at bedtime.         . Multiple Vitamin (MULTIVITAMIN WITH MINERALS) TABS   Oral   Take 1 tablet by mouth at bedtime.         Marland Kitchen oxyCODONE-acetaminophen (PERCOCET/ROXICET) 5-325 MG per tablet   Oral   Take 2 tablets by mouth every 6 (six) hours as needed for pain.         . polyethylene glycol (MIRALAX / GLYCOLAX) packet   Oral   Take 17 g by mouth 2 (two) times daily as needed (for constipation).          . promethazine (PHENERGAN) 25 MG tablet   Oral   Take 25 mg by mouth every 6 (six) hours as needed for nausea.          . Red Yeast Rice Extract (RED YEAST RICE PO)   Oral   Take 1 capsule by mouth daily.         Marland Kitchen senna (SENOKOT) 8.6 MG TABS   Oral   Take 1 tablet by mouth at bedtime.          . topiramate (TOPAMAX)  25 MG tablet   Oral   Take 50 mg by mouth at bedtime.          . traZODone (DESYREL) 50 MG tablet   Oral   Take 50 mg by mouth at bedtime.           Marland Kitchen levonorgestrel (MIRENA) 20 MCG/24HR IUD   Intrauterine   1 each by Intrauterine route once.         . Rizatriptan Benzoate (MAXALT PO)   Oral   Take 1 tablet by mouth every 2 (two) hours as needed. For migraines.          BP 116/79  Pulse 78   Temp(Src) 98 F (36.7 C) (Oral)  Resp 18  SpO2 97% Physical Exam  Nursing note and vitals reviewed. Constitutional: She is oriented to person, place, and time. She appears well-developed and well-nourished. No distress.  HENT:  Head: Normocephalic and atraumatic.  Eyes: EOM are normal. Pupils are equal, round, and reactive to light.  Neck: Normal range of motion. Neck supple.  Cardiovascular: Normal rate and regular rhythm.  Exam reveals no friction rub.   No murmur heard. Pulmonary/Chest: Effort normal and breath sounds normal. No respiratory distress. She has no wheezes. She has no rales.  Abdominal: Soft. She exhibits no distension. There is tenderness (RLQ, suprapubic). There is no rebound.  Genitourinary: Cervix exhibits discharge (yellow). Cervix exhibits no motion tenderness. Right adnexum displays no mass, no tenderness and no fullness. Left adnexum displays no mass, no tenderness and no fullness.  Musculoskeletal: Normal range of motion. She exhibits no edema.  Neurological: She is alert and oriented to person, place, and time.  Skin: She is not diaphoretic.    ED Course   Procedures (including critical care time)  Labs Reviewed  WET PREP, GENITAL - Abnormal; Notable for the following:    WBC, Wet Prep HPF POC FEW (*)    All other components within normal limits  CBC - Abnormal; Notable for the following:    Platelets 125 (*)    All other components within normal limits  BASIC METABOLIC PANEL - Abnormal; Notable for the following:    Glucose, Bld 100 (*)    All other components within normal limits  GC/CHLAMYDIA PROBE AMP  HEPATIC FUNCTION PANEL  URINALYSIS, ROUTINE W REFLEX MICROSCOPIC  CG4 I-STAT (LACTIC ACID)   Ct Abdomen Pelvis W Contrast  10/11/2012   *RADIOLOGY REPORT*  Clinical Data: right lower quadrant and suprapubic pain  CT ABDOMEN AND PELVIS WITH CONTRAST  Technique:  Multidetector CT imaging of the abdomen and pelvis was performed following the standard  protocol during bolus administration of intravenous contrast.  Contrast: 50mL OMNIPAQUE IOHEXOL 300 MG/ML  SOLN, 80mL OMNIPAQUE IOHEXOL 300 MG/ML  SOLN  Comparison: 11/11/10  Findings: The lung bases are clear.  There are no acute musculoskeletal findings.  Liver, gallbladder, spleen, pancreas, kidneys, and adrenal glands are normal.  Bowel is normal.  Bladder appears normal.  There is an intrauterine device in anticipated position.  There are no adnexal masses.  There is no free fluid.  The origin of the appendix of the cecum appears normal, with the appendix measuring about 3 mm in diameter and opacifying with contrast.  The tip of the appendix however dilates to 7 mm and shows very mildly indistinct margins with no contrast within it.  IMPRESSION: The findings are not entirely specific for appendicitis.  However, in the appropriate clinical setting, the possibility of early or mild inflammatory change  involving only the distal tip of the appendix is a consideration based on the CT appearance.   Original Report Authenticated By: Esperanza Heir, M.D.   1. Abdominal  pain, other specified site     MDM  10F with chronic abdominal pain presents with worsening abdominal pain. RLQ, mid abdominal pain. Nausea, no vomiting, no diarrhea. Patient has hx of IBS, constipation.  On exam, RLQ pain with voluntary guarding. No rebound tenderness. Mild suprapubic tenderness. Will perform pelvic and obtain CT scan.   Pelvic normal. CT scan with possible early appendicitis. Surgery consulted - dispo per them.  I have reviewed all labs and imaging and considered them in my medical decision making.   Dagmar Hait, MD 10/11/12 [phone]

## 2012-10-12 DIAGNOSIS — R109 Unspecified abdominal pain: Secondary | ICD-10-CM | POA: Diagnosis not present

## 2012-10-12 DIAGNOSIS — R1084 Generalized abdominal pain: Secondary | ICD-10-CM | POA: Diagnosis not present

## 2012-10-12 LAB — CBC
HCT: 37.4 % (ref 36.0–46.0)
Hemoglobin: 12.6 g/dL (ref 12.0–15.0)
MCH: 31.5 pg (ref 26.0–34.0)
MCHC: 33.7 g/dL (ref 30.0–36.0)
MCV: 93.5 fL (ref 78.0–100.0)
Platelets: 187 10*3/uL (ref 150–400)
RBC: 4 MIL/uL (ref 3.87–5.11)
RDW: 12.7 % (ref 11.5–15.5)
WBC: 4.3 10*3/uL (ref 4.0–10.5)

## 2012-10-12 LAB — GC/CHLAMYDIA PROBE AMP
CT Probe RNA: NEGATIVE
GC Probe RNA: NEGATIVE

## 2012-10-12 MED ORDER — DIPHENHYDRAMINE HCL 50 MG PO CAPS
50.0000 mg | ORAL_CAPSULE | Freq: Four times a day (QID) | ORAL | Status: DC | PRN
  Administered 2012-10-12 – 2012-10-18 (×5): 50 mg via ORAL
  Filled 2012-10-12 (×5): qty 1

## 2012-10-12 MED ORDER — POLYETHYLENE GLYCOL 3350 17 G PO PACK
17.0000 g | PACK | Freq: Three times a day (TID) | ORAL | Status: DC
  Administered 2012-10-12 – 2012-10-14 (×4): 17 g via ORAL
  Filled 2012-10-12 (×8): qty 1

## 2012-10-12 NOTE — Consult Note (Signed)
[facility] CONSULT Reason for consult: Abdominal Pain Referring Physician: Dr. Truitt Leep Danielle Bruce is an 52 y.o. female.  HPI: she has a long history of slow transit constipation. She is followed by Dr. Matthias Hughs. She has a history of colon polyps as well. Her last colonoscopy 2009 revealed diffuse melanosis coli . She has been seen in [facility] for severe IBS and carries diagnosis of bipolar disorder, anxiety and depression, and ADD. She was at the beach with [place] for the past week or so. She normally takes Miralax and Senna.every night in order to have a bowel movement. Apparently, while they are she got somewhat away from her normal regimen. She has had 4 days of abdominal pain. She describes the pain is migratory over her entire abdomen. She presented to the emergency room with this history. No fever or chills. In the emergency room she had CT scan performed which showed in intrauterine device that appeared normal. There was no free fluid. The tip of the appendix was slightly dilated. This was not felt to be typical for appendicitis. The patient is continuing to complain of the pain. Her [place] blood [place] remain normal and she has been a febrile since her admission.  Past Medical History  Diagnosis Date  . GERD (gastroesophageal reflux disease)   . 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 Migraines   . Bipolar 2 disorder   . IBS (irritable bowel syndrome)   . Constipation   . ASCUS (atypical squamous cells of undetermined significance) on Pap smear 05/06/05    NEG HIGH RISK HPV--C&B BIOPSY BENIGN 12/2005  . Anxiety   . 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    cytology negative    Past Surgical History  Procedure Laterality Date  . Hemorrhoid surgery  1993    x3  . Cesarean section  [id number]  . Shoulder surgery  2007/2008  . Spine surgery  2010    cervical  . Dilation and curettage of uterus    . Pelvic laparoscopy    . Intrauterine device insertion  01/04/2010    MIRENA     Family History  Problem Relation Age of Onset  . Diabetes Father   . Hypertension Father   . Hyperlipidemia Father   . Heart disease Maternal Grandfather   . Heart disease Paternal Grandmother   . Heart disease Paternal Grandfather     Social History:  reports that she has been smoking Cigarettes.  She has been smoking about 0.00 packs per day. She has never used smokeless tobacco. She reports that  drinks alcohol. She reports that she does not use illicit drugs.  Allergies:  Allergies  Allergen Reactions  . Sulfa Antibiotics     Causes pt to vomit blood  . Flagyl [Metronidazole Hcl] Nausea And Vomiting  . Morphine And Related     Depression    Medications; . fluvoxaMINE  50 mg Oral QHS  . gabapentin  600 mg Oral BID  . heparin  5,000 Units Subcutaneous Q8H  . lamoTRIgine  200 mg Oral QHS  . liothyronine  25 mcg Oral Daily  . loratadine  10 mg Oral QHS  . montelukast  10 mg Oral QHS  . pantoprazole  80 mg Oral Daily  . senna  1 tablet Oral QHS  . topiramate  50 mg Oral QHS  . traZODone  50 mg Oral QHS   PRN Meds albuterol, diphenhydrAMINE, docusate sodium, HYDROmorphone (DILAUDID) injection, LORazepam, ondansetron, polyethylene glycol   Ct Abdomen  Pelvis W Contrast  10/11/2012   *RADIOLOGY REPORT*  Clinical Data: right lower quadrant and suprapubic pain  CT ABDOMEN AND PELVIS WITH CONTRAST  Technique:  Multidetector CT imaging of the abdomen and pelvis was performed following the standard protocol during bolus administration of intravenous contrast.  Contrast: 50mL OMNIPAQUE IOHEXOL 300 MG/ML  SOLN, 80mL OMNIPAQUE IOHEXOL 300 MG/ML  SOLN  Comparison: 11/11/10  Findings: The lung bases are clear.  There are no acute musculoskeletal findings.  Liver, gallbladder, spleen, pancreas, kidneys, and adrenal glands are normal.  Bowel is normal.  Bladder appears normal.  There is an intrauterine device in anticipated position.  There are no adnexal masses.  There is no free fluid.   The origin of the appendix of the cecum appears normal, with the appendix measuring about 3 mm in diameter and opacifying with contrast.  The tip of the appendix however dilates to 7 mm and shows very mildly indistinct margins with no contrast within it.  IMPRESSION: The findings are not entirely specific for appendicitis.  However, in the appropriate clinical setting, the possibility of early or mild inflammatory change involving only the distal tip of the appendix is a consideration based on the CT appearance.   Original Report Authenticated By: Esperanza Heir, M.D.               Blood pressure 118/74, pulse 60, temperature 98.6 F (37 C), temperature source Oral, resp. rate 18, height 5\' 2"  (1.575 m), weight 52.4 kg (115 lb 8.3 oz), SpO2 98.00%.  Physical exam:   General-- patient in no distress. She is ordering clear liquid dinner from the cafeteria. Mother is hovering at the bedside  Heart-- regular rate and rhythm without murmurs are gallops  Lungs-- clear Abdomen-- positive bowel sounds and none distended with mild. Umbilical discomfort is not particularly impressive    Assessment:  1. Abdominal pain.. A typical for appendicitis. She states that she had bowel movement 2 to 3 days ago it seems somewhat vague on the timing. She and her mother got into a discussion about her bowels. It may be that she is becoming constipated. I would like to see regular bowel movements to see if that will help her symptoms. 2. Slow transit constipation/IBS. Has had extensive evaluation for this and has seen Dr. Almyra Deforest at [facility] 3. History of colon polyps. Colonoscopy up-to-date last in 2009. 4. Chronic anxiety/depression/bipolar/ADD.   Plan: I would keep her on clear liquids for now just in case this does turn out to be appendicitis. We'll continue to monitor her vital signs and white count. We'll go ahead and check in ultrasound non-urgently just to be sure she does not have  gallstones we will increase her Miralax until we get a result.   Neldon Shepard JR,Olie Scaffidi L 10/12/2012, 1:26 PM

## 2012-10-12 NOTE — Progress Notes (Signed)
Patient ID: Danielle Bruce, female   DOB: [birth year], 52 y.o.   MRN: [id number]  General Surgery - [facility] - Progress Note  HD# 2  Subjective: Patient with persistent epigastric and periumbilical pain.  Complains of nausea.  No diarrhea.  No emesis.  Wants heating pad for abdomen.  Mother at bedside.  Objective: Vital signs in last 24 hours: Temp:  [97.8 F (36.6 C)-98.4 F (36.9 C)] 97.8 F (36.6 C) (08/16 0510) Pulse Rate:  [58-78] 58 (08/16 0510) Resp:  [16-18] 16 (08/16 0510) BP: (108-117)/(54-79) 110/54 mmHg (08/16 0510) SpO2:  [94 %-100 %] 100 % (08/16 0510) Weight:  [115 lb 8.3 oz (52.4 kg)] 115 lb 8.3 oz (52.4 kg) (08/15 2020) Last BM Date: 10/11/12  Intake/Output from previous day: 08/15 0701 - 08/16 0700 In: 983.3 [I.V.:983.3] Out: -   Exam: HEENT - clear, not icteric Neck - soft Chest - clear bilaterally Cor - RRR, no murmur Abd - soft without distension; mild tenderness diffusely, poorly localized; no guarding; no mass Ext - no significant edema Neuro - grossly intact, no focal deficits  Lab Results:   Recent Labs  10/11/12 1309 10/12/12 0455  WBC 5.1 4.3  HGB 14.1 12.6  HCT 41.5 37.4  PLT 125* 187     Recent Labs  10/11/12 1309  NA 140  K 4.4  CL 106  CO2 23  GLUCOSE 100*  BUN 13  CREATININE 0.63  CALCIUM 9.7    Studies/Results: Ct Abdomen Pelvis W Contrast  10/11/2012   *RADIOLOGY REPORT*  Clinical Data: right lower quadrant and suprapubic pain  CT ABDOMEN AND PELVIS WITH CONTRAST  Technique:  Multidetector CT imaging of the abdomen and pelvis was performed following the standard protocol during bolus administration of intravenous contrast.  Contrast: 50mL OMNIPAQUE IOHEXOL 300 MG/ML  SOLN, 80mL OMNIPAQUE IOHEXOL 300 MG/ML  SOLN  Comparison: 11/11/10  Findings: The lung bases are clear.  There are no acute musculoskeletal findings.  Liver, gallbladder, spleen, pancreas, kidneys, and adrenal glands are normal.  Bowel is  normal.  Bladder appears normal.  There is an intrauterine device in anticipated position.  There are no adnexal masses.  There is no free fluid.  The origin of the appendix of the cecum appears normal, with the appendix measuring about 3 mm in diameter and opacifying with contrast.  The tip of the appendix however dilates to 7 mm and shows very mildly indistinct margins with no contrast within it.  IMPRESSION: The findings are not entirely specific for appendicitis.  However, in the appropriate clinical setting, the possibility of early or mild inflammatory change involving only the distal tip of the appendix is a consideration based on the CT appearance.   Original Report Authenticated By: Esperanza Heir, M.D.    Assessment / Plan: 1.  Abdominal pain  No evidence of acute appendicitis on exam this AM  WBC normal  History of chronic abdominal pain and motility disorder followed by Dr. Matthias Hughs  Will consult [facility] for evaluation and assistance with management  No indication for acute surgical intervention  Allow clear liquid diet  Increase Benadryl to 50mg  po per dose at patient and family request  Heating pad (K pad) per patient request  Velora Heckler, MD, [facility] Office: [phone]  10/12/2012

## 2012-10-13 ENCOUNTER — Inpatient Hospital Stay (HOSPITAL_COMMUNITY): Payer: Medicare Other

## 2012-10-13 DIAGNOSIS — K8020 Calculus of gallbladder without cholecystitis without obstruction: Secondary | ICD-10-CM | POA: Diagnosis not present

## 2012-10-13 DIAGNOSIS — R109 Unspecified abdominal pain: Secondary | ICD-10-CM | POA: Diagnosis not present

## 2012-10-13 DIAGNOSIS — R1084 Generalized abdominal pain: Secondary | ICD-10-CM | POA: Diagnosis not present

## 2012-10-13 MED ORDER — LORAZEPAM 2 MG/ML IJ SOLN
1.0000 mg | Freq: Once | INTRAMUSCULAR | Status: AC
  Administered 2012-10-13: 1 mg via INTRAVENOUS
  Filled 2012-10-13: qty 1

## 2012-10-13 NOTE — Progress Notes (Signed)
[facility] PROGRESS NOTE Subjective Pain somewhat better for unclear reasons. Went 8 hours w/o pain meds but then got worse again. Minimal BM so far.  Objective: Vital signs in last 24 hours: Temp:  [97.8 F (36.6 C)-98.6 F (37 C)] 97.8 F (36.6 C) (08/17 0500) Pulse Rate:  [50-60] 56 (08/17 0500) Resp:  [18] 18 (08/17 0500) BP: (102-118)/(62-74) 102/62 mmHg (08/17 0500) SpO2:  [95 %-99 %] 98 % (08/17 0500) Last BM Date: 10/11/12  Intake/Output from previous day: 08/16 0701 - 08/17 0700 In: 1753.3 [I.V.:1753.3] Out: -  Intake/Output this shift: Total I/O In: 600 [I.V.:600] Out: -   PE: General--awake in bed, mom at the bedside Heart-- Lungs--clear Abdomen--good BSs, nondistended NL tenderness  Lab Results:  Recent Labs  10/11/12 1309 10/12/12 0455  WBC 5.1 4.3  HGB 14.1 12.6  HCT 41.5 37.4  PLT 125* 187   BMET  Recent Labs  10/11/12 1309  NA 140  K 4.4  CL 106  CO2 23  CREATININE 0.63   LFT  Recent Labs  10/11/12 1309  PROT 6.9  AST 32  ALT 35  ALKPHOS 57  BILITOT 0.5  BILIDIR 0.1  IBILI 0.4   PT/INR No results found for this basename: LABPROT, INR,  in the last 72 hours PANCREAS No results found for this basename: LIPASE,  in the last 72 hours       Studies/Results: Ct Abdomen Pelvis W Contrast  10/11/2012   *RADIOLOGY REPORT*  Clinical Data: right lower quadrant and suprapubic pain  CT ABDOMEN AND PELVIS WITH CONTRAST  Technique:  Multidetector CT imaging of the abdomen and pelvis was performed following the standard protocol during bolus administration of intravenous contrast.  Contrast: 50mL OMNIPAQUE IOHEXOL 300 MG/ML  SOLN, 80mL OMNIPAQUE IOHEXOL 300 MG/ML  SOLN  Comparison: 11/11/10  Findings: The lung bases are clear.  There are no acute musculoskeletal findings.  Liver, gallbladder, spleen, pancreas, kidneys, and adrenal glands are normal.  Bowel is normal.  Bladder appears normal.  There is an intrauterine device in  anticipated position.  There are no adnexal masses.  There is no free fluid.  The origin of the appendix of the cecum appears normal, with the appendix measuring about 3 mm in diameter and opacifying with contrast.  The tip of the appendix however dilates to 7 mm and shows very mildly indistinct margins with no contrast within it.  IMPRESSION: The findings are not entirely specific for appendicitis.  However, in the appropriate clinical setting, the possibility of early or mild inflammatory change involving only the distal tip of the appendix is a consideration based on the CT appearance.   Original Report Authenticated By: Esperanza Heir, M.D.    Medications: I have reviewed the patient's current medications.  Assessment/Plan: 1. NL Abdominal Pain. Seems better, PE not suggestive of appendicitis but the CT is slightly abnormal. Continue the miralax and CLs for now follow WBC hopefully will improve with good BM, but if things change, low threshold for appendectomy.   Gregory Dowe JR,Teylor Wolven L 10/13/2012, 5:58 AM

## 2012-10-13 NOTE — Progress Notes (Signed)
Patient ID: Danielle Bruce, female   DOB: 1960/12/27, 52 y.o.   MRN: [id number]  General Surgery - [facility] - Progress Note  HD# 3  Subjective: Patient sleeping comfortably.  Awakens easily.  Mother at bedside.  Complains of upper to mid abdominal pain and nausea during night, relieved by medications.  Ambulatory.  Wants a "smoothie" to eat.  Objective: Vital signs in last 24 hours: Temp:  [97.8 F (36.6 C)-98.6 F (37 C)] 97.8 F (36.6 C) (08/17 0500) Pulse Rate:  [50-60] 56 (08/17 0500) Resp:  [18] 18 (08/17 0500) BP: (102-118)/(62-74) 102/62 mmHg (08/17 0500) SpO2:  [95 %-99 %] 98 % (08/17 0500) Last BM Date: 10/11/12  Intake/Output from previous day: 08/16 0701 - 08/17 0700 In: 2053.3 [I.V.:2053.3] Out: -   Exam: HEENT - clear, not icteric Neck - soft Chest - clear bilaterally Cor - RRR, no murmur Abd - soft without distension; active BS; mild tenderness epigastrium / periumbilical (poorly localized); no tenderness RLQ, no mass Ext - no significant edema Neuro - grossly intact, no focal deficits  Lab Results:   Recent Labs  10/11/12 1309 10/12/12 0455  WBC 5.1 4.3  HGB 14.1 12.6  HCT 41.5 37.4  PLT 125* 187     Recent Labs  10/11/12 1309  NA 140  K 4.4  CL 106  CO2 23  GLUCOSE 100*  BUN 13  CREATININE 0.63  CALCIUM 9.7    Studies/Results: Ct Abdomen Pelvis W Contrast  10/11/2012   *RADIOLOGY REPORT*  Clinical Data: right lower quadrant and suprapubic pain  CT ABDOMEN AND PELVIS WITH CONTRAST  Technique:  Multidetector CT imaging of the abdomen and pelvis was performed following the standard protocol during bolus administration of intravenous contrast.  Contrast: 50mL OMNIPAQUE IOHEXOL 300 MG/ML  SOLN, 80mL OMNIPAQUE IOHEXOL 300 MG/ML  SOLN  Comparison: 11/11/10  Findings: The lung bases are clear.  There are no acute musculoskeletal findings.  Liver, gallbladder, spleen, pancreas, kidneys, and adrenal glands are normal.  Bowel is  normal.  Bladder appears normal.  There is an intrauterine device in anticipated position.  There are no adnexal masses.  There is no free fluid.  The origin of the appendix of the cecum appears normal, with the appendix measuring about 3 mm in diameter and opacifying with contrast.  The tip of the appendix however dilates to 7 mm and shows very mildly indistinct margins with no contrast within it.  IMPRESSION: The findings are not entirely specific for appendicitis.  However, in the appropriate clinical setting, the possibility of early or mild inflammatory change involving only the distal tip of the appendix is a consideration based on the CT appearance.   Original Report Authenticated By: Esperanza Heir, M.D.    Assessment / Plan: 1.  Abdominal pain of undetermined etiology  No evidence of acute appendicitis  Appreciate GI consultation  Abdominal USN ordered by GI consult for today  Advance to full liquid diet after USN at patient request  Velora Heckler, MD, [facility] Office: [phone]  10/13/2012

## 2012-10-14 DIAGNOSIS — K5900 Constipation, unspecified: Secondary | ICD-10-CM | POA: Diagnosis not present

## 2012-10-14 DIAGNOSIS — R109 Unspecified abdominal pain: Secondary | ICD-10-CM | POA: Diagnosis not present

## 2012-10-14 DIAGNOSIS — K811 Chronic cholecystitis: Secondary | ICD-10-CM | POA: Diagnosis not present

## 2012-10-14 DIAGNOSIS — K8020 Calculus of gallbladder without cholecystitis without obstruction: Secondary | ICD-10-CM | POA: Diagnosis not present

## 2012-10-14 MED ORDER — PEG 3350-KCL-NA BICARB-NACL 420 G PO SOLR
4000.0000 mL | Freq: Once | ORAL | Status: AC
  Administered 2012-10-14: 4000 mL via ORAL
  Filled 2012-10-14: qty 4000

## 2012-10-14 NOTE — Progress Notes (Signed)
Patient ID: Danielle Bruce, female   DOB: Jul 16, 1960, 52 y.o.   MRN: [id number]  General Surgery - [facility] - Progress Note  HD# 3  Subjective: No changes, still with pain after ativan and dilaudid start wearing off, afraid of being discharged with this pain and having to live with it forever.  Denies n/v  Objective: Vital signs in last 24 hours: Temp:  [97.7 F (36.5 C)-98 F (36.7 C)] 98 F (36.7 C) (08/18 0624) Pulse Rate:  [65-74] 65 (08/18 0624) Resp:  [18] 18 (08/18 0624) BP: (102-118)/(64-73) 102/64 mmHg (08/18 0624) SpO2:  [96 %-97 %] 96 % (08/18 0624) Last BM Date: 10/13/12  Intake/Output from previous day: 08/17 0701 - 08/18 0700 In: 1800 [I.V.:1800] Out: 2700 [Urine:2700]  Exam: General: NAD Chest - clear bilaterally Cor - RRR, no murmur Abd - soft non distension; active BS; mild tenderness epigastrium / periumbilical (poorly localized); no tenderness RLQ Neuro - grossly intact, no focal deficits  Lab Results:   Recent Labs  10/11/12 1309 10/12/12 0455  WBC 5.1 4.3  HGB 14.1 12.6  HCT 41.5 37.4  PLT 125* 187     Recent Labs  10/11/12 1309  NA 140  K 4.4  CL 106  CO2 23  GLUCOSE 100*  BUN 13  CREATININE 0.63  CALCIUM 9.7    Studies/Results: US Abdomen Complete  10/13/2012   CLINICAL DATA:  Abdominal pain.  Cholelithiasis.  EXAM: ABDOMEN ULTRASOUND  COMPARISON:  09/11/2006  FINDINGS: Gallbladder: A single tiny less than 5 mm gallstone is seen which is mobile. No evidence of gallbladder wall thickening, dilatation, or pericholecystic fluid.  Common bile duct:  Measures 4 mm in diameter.  Liver: No focal lesion identified. Within normal limits in parenchymal echogenicity.  IVC:  Appears normal.  Pancreas:  No focal abnormality seen.  Spleen: Within normal limits in size and echogenicity.  Right Kidney:  Measures 10.1 cm in length. Normal appearance.  Left Kidney:  Measures 11.1 cm in length.  Normal appearance.  Abdominal aorta:   No aneurysm identified.  IMPRESSION: Cholelithiasis. No sonographic signs of acute cholecystitis, biliary dilatation, or other significant abnormality.   Electronically Signed   By: Myles Rosenthal   On: 10/13/2012 14:03    Assessment / Plan: 1.  Abdominal pain of undetermined etiology  No evidence of acute appendicitis  Afebrile and no leukocytosis  Abd [place] yesterday showed cholelithiasis but no evidence of acute cholecystitis or obstruction  Appreciate GI consultation   ?HIDA scan to rule out gallbladder dysfunction   Danielle Bruce, [facility] Office: [phone]  10/14/2012

## 2012-10-14 NOTE — Progress Notes (Signed)
Subjective: Pain persists, requiring frequent dosing of intravenous analgesics.  Objective: Vital signs in last 24 hours: Temp:  [97.7 F (36.5 C)-98 F (36.7 C)] 98 F (36.7 C) (08/18 0624) Pulse Rate:  [65-74] 65 (08/18 0624) Resp:  [18] 18 (08/18 0624) BP: (102-118)/(64-73) 102/64 mmHg (08/18 0624) SpO2:  [96 %-97 %] 96 % (08/18 0624) Weight change:  Last BM Date: 10/13/12  PE: GEN:  Depressed mood, flat affect, NAD ABD:  Soft, mild distended, mild periumbilical and lower abdominal tenderness, active bowel sounds  Lab Results: None  Studies/Results: US Abdomen Complete  10/13/2012   CLINICAL DATA:  Abdominal pain.  Cholelithiasis.  EXAM: ABDOMEN ULTRASOUND  COMPARISON:  09/11/2006  FINDINGS: Gallbladder: A single tiny less than 5 mm gallstone is seen which is mobile. No evidence of gallbladder wall thickening, dilatation, or pericholecystic fluid.  Common bile duct:  Measures 4 mm in diameter.  Liver: No focal lesion identified. Within normal limits in parenchymal echogenicity.  IVC:  Appears normal.  Pancreas:  No focal abnormality seen.  Spleen: Within normal limits in size and echogenicity.  Right Kidney:  Measures 10.1 cm in length. Normal appearance.  Left Kidney:  Measures 11.1 cm in length.  Normal appearance.  Abdominal aorta:  No aneurysm identified.  IMPRESSION: Cholelithiasis. No sonographic signs of acute cholecystitis, biliary dilatation, or other significant abnormality.   Electronically Signed   By: Myles Rosenthal   On: 10/13/2012 14:03   Assessment:  1.  Abdominal pain.  Likely multifactorial.  Anxiety/depression are playing role.  Constipation could be at work, both narcotic-related and C-IBS.  Narcotic bowel syndrome another thought as well. 2.  Gallstones.  Current symptoms do not sound compatible with biliary tract disease.  Patient is aware of previously having gallstones. 3.  Constipation.  Plan:  1.  Bowel prep today as fecal purgative, see if it helps her  pain at all. 2.  Advancement of diet per [facility] team. 3.  Unclear what endpoint of narcotics is; there is no clear source of pain we are treating; would try to wean narcotics as possible. 4.  Will follow.   Freddy Jaksch 10/14/2012, 9:07 AM

## 2012-10-14 NOTE — Progress Notes (Signed)
Etiology of pain unclear.  Symptoms all over the place.  Not sure why she needs narcotics.  Check HIDA Tuesday and see if dyskinesia an issue.  Small gallstone but not sure GB removal will help. Success of cholecystectomy in this setting at relieving her symptoms is 25-30 %.  Discussed with family at bedside.  GI recommended a clean out an we will see how this goes.

## 2012-10-15 ENCOUNTER — Encounter (HOSPITAL_COMMUNITY): Payer: Self-pay

## 2012-10-15 ENCOUNTER — Inpatient Hospital Stay (HOSPITAL_COMMUNITY): Payer: Medicare Other

## 2012-10-15 DIAGNOSIS — R1084 Generalized abdominal pain: Secondary | ICD-10-CM | POA: Diagnosis not present

## 2012-10-15 DIAGNOSIS — R109 Unspecified abdominal pain: Secondary | ICD-10-CM | POA: Diagnosis not present

## 2012-10-15 MED ORDER — TECHNETIUM TC 99M MEBROFENIN IV KIT
5.5000 | PACK | Freq: Once | INTRAVENOUS | Status: AC | PRN
  Administered 2012-10-15: 5.5 via INTRAVENOUS

## 2012-10-15 NOTE — Progress Notes (Signed)
[facility] PROGRESS NOTE Subjective Pt still in pain  Objective: Vital signs in last 24 hours: Temp:  [97.6 F (36.4 C)-98.3 F (36.8 C)] 98.3 F (36.8 C) (08/19 0526) Pulse Rate:  [60-78] 63 (08/19 1057) Resp:  [16-18] 16 (08/19 0526) BP: (84-119)/(47-71) 119/58 mmHg (08/19 1057) SpO2:  [96 %] 96 % (08/19 0526) Last BM Date: 10/15/12  Intake/Output from previous day: 08/18 0701 - 08/19 0700 In: 1980 [P.O.:480; I.V.:1500] Out: -  Intake/Output this shift: Total I/O In: 921.3 [I.V.:921.3] Out: 1000 [Urine:1000]  PE: General--mother and father hovering at bedside helping her answer questions Heart-- Lungs-- Abdomen--nondistended  + BS NL tenderness  Lab Results: No results found for this basename: WBC, HGB, HCT, PLT,  in the last 72 hours BMET No results found for this basename: NA, K, CL, CO2, CREATININE,  in the last 72 hours LFT No results found for this basename: PROT, AST, ALT, ALKPHOS, BILITOT, BILIDIR, IBILI,  in the last 72 hours PT/INR No results found for this basename: LABPROT, INR,  in the last 72 hours PANCREAS No results found for this basename: LIPASE,  in the last 72 hours       Studies/Results: Nm Hepato W/eject Fract  10/15/2012   *RADIOLOGY REPORT*  Clinical Data:  Abdominal pain  NUCLEAR MEDICINE HEPATOBILIARY IMAGING WITH GALLBLADDER EF:  Technique: Sequential images of the abdomen were obtained for 60 minutes following intravenous administration of radiopharmaceutical.  Patient then received an infusion of 0.02 ugm/kg of CCK analog intravenously over 30 minutes, and imaging was continued for 30 minutes.  A time-activity curve was generated from tracer within the gallbladder following CCK administration, and the gallbladder ejection fraction was calculated.  Radiopharmaceutical:  5.5 mCi Tc-57m mebrofenin Pharmaceutical:  1.0 mcg CCK  Comparison: None  Findings: Prompt tracer extraction from bloodstream, indicating normal hepatocellular  function. Prompt excretion of tracer into biliary tree. Gallbladder visualized at 7 minutes. Small bowel visualized at 18 minutes. No hepatic retention of tracer.  Subjectively normal emptying of tracer from gallbladder following CCK stimulation. Calculated gallbladder ejection fraction is 81%, normal. Patient experienced no symptoms following CCK administration.  IMPRESSION: Normal exam.  Normal values for gallbladder ejection fraction: > 30% for exams utilizing sincalide (CCK) > 33% for exams utilizing Ensure Plus stimulation   Original Report Authenticated By: Ulyses Southward, M.D.    Medications: I have reviewed the patient's current medications.  Assessment/Plan: 1. Abd Pain. PE very confusing. ? Abnormality of appendix and single small GS, normal HIDA with EF pt wants and operation to take away her pain Hard to tell if psych or real Will discuss with surgery.    Karlei Waldo JR,Shakita Keir L 10/15/2012, 3:30 PM

## 2012-10-15 NOTE — Progress Notes (Signed)
Subjective: Pt continues to have pain.  The pain meds, heating pad, and bringing her legs in to her chest or curing in a ball helps the pain.  Pt tolerating some clear liquids.  She just got back from HIDA scan and knows its normal.  She's afraid that health care workers don't believe she's in pain.  She knows her mental health plays a big role in her pain and the way she deals with pain.  She would like to proceed with the dx lap.   Objective: Vital signs in last 24 hours: Temp:  [97.6 F (36.4 C)-98.4 F (36.9 C)] 98.3 F (36.8 C) (08/19 0526) Pulse Rate:  [60-78] 60 (08/19 0526) Resp:  [16-18] 16 (08/19 0526) BP: (84-114)/(47-71) 100/60 mmHg (08/19 0612) SpO2:  [96 %] 96 % (08/19 0526) Last BM Date: 10/14/12  Intake/Output from previous day: 08/18 0701 - 08/19 0700 In: 1980 [P.O.:480; I.V.:1500] Out: -  Intake/Output this shift:    PE: Gen:  Alert, NAD, pleasant Abd: Soft, ND, subjectively tender to palpation with my hand, but when I pressed deeply with my stethoscope she had absolutely no pain, bending the legs up reduces pain in the abdomen greatly, +BS, no HSM   Lab Results:  No results found for this basename: WBC, HGB, HCT, PLT,  in the last 72 hours BMET No results found for this basename: NA, K, CL, CO2, GLUCOSE, BUN, CREATININE, CALCIUM,  in the last 72 hours PT/INR No results found for this basename: LABPROT, INR,  in the last 72 hours CMP     Component Value Date/Time   NA 140 10/11/2012 1309   K 4.4 10/11/2012 1309   CL 106 10/11/2012 1309   CO2 23 10/11/2012 1309   GLUCOSE 100* 10/11/2012 1309   BUN 13 10/11/2012 1309   CREATININE 0.63 10/11/2012 1309   CALCIUM 9.7 10/11/2012 1309   PROT 6.9 10/11/2012 1309   ALBUMIN 4.1 10/11/2012 1309   AST 32 10/11/2012 1309   ALT 35 10/11/2012 1309   ALKPHOS 57 10/11/2012 1309   BILITOT 0.5 10/11/2012 1309   GFRNONAA >90 10/11/2012 1309   GFRAA >90 10/11/2012 1309   Lipase  No results found for this basename: lipase        Studies/Results: US Abdomen Complete  10/13/2012   CLINICAL DATA:  Abdominal pain.  Cholelithiasis.  EXAM: ABDOMEN ULTRASOUND  COMPARISON:  09/11/2006  FINDINGS: Gallbladder: A single tiny less than 5 mm gallstone is seen which is mobile. No evidence of gallbladder wall thickening, dilatation, or pericholecystic fluid.  Common bile duct:  Measures 4 mm in diameter.  Liver: No focal lesion identified. Within normal limits in parenchymal echogenicity.  IVC:  Appears normal.  Pancreas:  No focal abnormality seen.  Spleen: Within normal limits in size and echogenicity.  Right Kidney:  Measures 10.1 cm in length. Normal appearance.  Left Kidney:  Measures 11.1 cm in length.  Normal appearance.  Abdominal aorta:  No aneurysm identified.  IMPRESSION: Cholelithiasis. No sonographic signs of acute cholecystitis, biliary dilatation, or other significant abnormality.   Electronically Signed   By: Myles Rosenthal   On: 10/13/2012 14:03    Anti-infectives: Anti-infectives   None       Assessment/Plan Abdominal pain of undetermined etiology  Cholelithiasis without cholecystitis Anxiety/Depression Constipation ?Somatoform vs conversion DO  Plan: 1.  No evidence of acute appendicitis, Afebrile and no leukocytosis, Abd [place] yesterday showed cholelithiasis but no evidence of acute cholecystitis or obstruction, CT unimpressive, HIDA scan negative  for gallbladder dysfunction or obstruction 2.  Appreciate GI consultation  3.  Dr. Luisa Hart has offered an diagnostic laparotomy for tomorrow am 4.  Continue clears, then NPO after midnight, hold heparin    LOS: 4 days    DORT, Usman Millett 10/15/2012, 7:46 AM Pager: [phone]

## 2012-10-15 NOTE — Progress Notes (Signed)
Long discussion with patient and family.  Non surgical abdomen and work up negative.  Discussed dx laparoscopy as option but likihood of finding anything very low.  This would complete her work up from surgery stand point and put those issues to rest. Risk of 1 % of bowel perforation discussed.  Other risk include bleeding,  Infection,  Organ injury,  Worsening of abdominal pain open surgery , wound complications and the need for other procedures.  I doubt it will be helpful but pt would like to at least look.  Pt requested her GYN doc be consulted and that can be done in am.  Question psychiatric history or meds as other cause. Unfortunately work up negative so far. Offer referal to tertiary care but family wants to stay here.

## 2012-10-15 NOTE — Progress Notes (Signed)
HIDA normal. Benefit of cholecystectomy very low at resolving her pain.  Pain probably secondary to psychiatric issues.  Not much else to do.  Pain control and issue and will try to sort out.

## 2012-10-15 NOTE — Progress Notes (Signed)
Patient down in HIDA scan.  Pt's mother at bedside notes the patient has significant IBS, constipation/slow transit, mental health issues including bipolar disorder, anxiety, depression, and ADD.  The mother is worried that she is on too many medications.  She and the patient understand that her abdominal pains are closely related to her IBS and mental health issues.  She sees mental health providers.  The mother is hoping that the pain is from her GB so they have a diagnosis and hope she will get surgery to alleviate her pain.  The mother notes her pain has not resolved since the GI prep.  She had multiple BM's yesterday.  Will check on the patient later.

## 2012-10-16 ENCOUNTER — Encounter (HOSPITAL_COMMUNITY): Payer: Self-pay | Admitting: Anesthesiology

## 2012-10-16 ENCOUNTER — Encounter (HOSPITAL_COMMUNITY): Admission: EM | Disposition: A | Discharge status: Home / Self Care | Payer: Self-pay | Source: Home / Self Care

## 2012-10-16 ENCOUNTER — Inpatient Hospital Stay (HOSPITAL_COMMUNITY): Payer: Medicare Other | Admitting: Anesthesiology

## 2012-10-16 DIAGNOSIS — K8010 Calculus of gallbladder with chronic cholecystitis without obstruction: Secondary | ICD-10-CM | POA: Diagnosis not present

## 2012-10-16 DIAGNOSIS — F3189 Other bipolar disorder: Secondary | ICD-10-CM | POA: Diagnosis not present

## 2012-10-16 DIAGNOSIS — K589 Irritable bowel syndrome without diarrhea: Secondary | ICD-10-CM | POA: Diagnosis not present

## 2012-10-16 DIAGNOSIS — K8020 Calculus of gallbladder without cholecystitis without obstruction: Secondary | ICD-10-CM | POA: Diagnosis not present

## 2012-10-16 DIAGNOSIS — R109 Unspecified abdominal pain: Secondary | ICD-10-CM | POA: Diagnosis not present

## 2012-10-16 DIAGNOSIS — F411 Generalized anxiety disorder: Secondary | ICD-10-CM | POA: Diagnosis not present

## 2012-10-16 DIAGNOSIS — K811 Chronic cholecystitis: Secondary | ICD-10-CM | POA: Diagnosis not present

## 2012-10-16 DIAGNOSIS — K660 Peritoneal adhesions (postprocedural) (postinfection): Secondary | ICD-10-CM | POA: Diagnosis not present

## 2012-10-16 HISTORY — PX: LAPAROSCOPY: SHX197

## 2012-10-16 HISTORY — PX: CHOLECYSTECTOMY: SHX55

## 2012-10-16 HISTORY — PX: LAPAROSCOPIC LYSIS OF ADHESIONS: SHX5905

## 2012-10-16 LAB — GLUCOSE, CAPILLARY: Glucose-Capillary: 107 mg/dL — ABNORMAL HIGH (ref 70–99)

## 2012-10-16 SURGERY — LAPAROSCOPY, DIAGNOSTIC
Anesthesia: General | Site: Abdomen | Wound class: Clean Contaminated

## 2012-10-16 MED ORDER — GLYCOPYRROLATE 0.2 MG/ML IJ SOLN
INTRAMUSCULAR | Status: DC | PRN
  Administered 2012-10-16: 0.4 mg via INTRAVENOUS

## 2012-10-16 MED ORDER — DEXAMETHASONE SODIUM PHOSPHATE 10 MG/ML IJ SOLN
INTRAMUSCULAR | Status: DC | PRN
  Administered 2012-10-16: 10 mg via INTRAVENOUS

## 2012-10-16 MED ORDER — OXYCODONE-ACETAMINOPHEN 5-325 MG PO TABS
2.0000 | ORAL_TABLET | ORAL | Status: DC | PRN
  Administered 2012-10-16 – 2012-10-18 (×10): 2 via ORAL
  Filled 2012-10-16 (×10): qty 2

## 2012-10-16 MED ORDER — LACTATED RINGERS IV SOLN
INTRAVENOUS | Status: DC | PRN
  Administered 2012-10-16 (×2): via INTRAVENOUS

## 2012-10-16 MED ORDER — SUCCINYLCHOLINE CHLORIDE 20 MG/ML IJ SOLN
INTRAMUSCULAR | Status: DC | PRN
  Administered 2012-10-16: 100 mg via INTRAVENOUS

## 2012-10-16 MED ORDER — BUPIVACAINE-EPINEPHRINE 0.25% -1:200000 IJ SOLN
INTRAMUSCULAR | Status: DC | PRN
  Administered 2012-10-16: 10 mL

## 2012-10-16 MED ORDER — PANTOPRAZOLE SODIUM 40 MG PO TBEC
40.0000 mg | DELAYED_RELEASE_TABLET | Freq: Every day | ORAL | Status: DC
  Administered 2012-10-17 – 2012-10-18 (×2): 40 mg via ORAL
  Filled 2012-10-16 (×3): qty 1

## 2012-10-16 MED ORDER — LACTATED RINGERS IV SOLN
INTRAVENOUS | Status: DC | PRN
  Administered 2012-10-16: 1000 mL via INTRAVENOUS

## 2012-10-16 MED ORDER — ONDANSETRON HCL 4 MG/2ML IJ SOLN
INTRAMUSCULAR | Status: DC | PRN
  Administered 2012-10-16: 4 mg via INTRAVENOUS

## 2012-10-16 MED ORDER — FENTANYL CITRATE 0.05 MG/ML IJ SOLN
25.0000 ug | INTRAMUSCULAR | Status: DC | PRN
  Administered 2012-10-16 (×4): 25 ug via INTRAVENOUS

## 2012-10-16 MED ORDER — PROPOFOL 10 MG/ML IV BOLUS
INTRAVENOUS | Status: DC | PRN
  Administered 2012-10-16: 150 mg via INTRAVENOUS

## 2012-10-16 MED ORDER — FENTANYL CITRATE 0.05 MG/ML IJ SOLN
INTRAMUSCULAR | Status: DC | PRN
  Administered 2012-10-16: 100 ug via INTRAVENOUS

## 2012-10-16 MED ORDER — LACTATED RINGERS IV SOLN: INTRAVENOUS | Status: DC

## 2012-10-16 MED ORDER — MEPERIDINE HCL 50 MG/ML IJ SOLN
6.2500 mg | INTRAMUSCULAR | Status: DC | PRN
  Administered 2012-10-16: 6.25 mg via INTRAVENOUS

## 2012-10-16 MED ORDER — MIDAZOLAM HCL 5 MG/5ML IJ SOLN
INTRAMUSCULAR | Status: DC | PRN
  Administered 2012-10-16: 2 mg via INTRAVENOUS

## 2012-10-16 MED ORDER — LIDOCAINE HCL (CARDIAC) 20 MG/ML IV SOLN
INTRAVENOUS | Status: DC | PRN
  Administered 2012-10-16: 100 mg via INTRAVENOUS

## 2012-10-16 MED ORDER — NEOSTIGMINE METHYLSULFATE 1 MG/ML IJ SOLN
INTRAMUSCULAR | Status: DC | PRN
  Administered 2012-10-16: 3 mg via INTRAVENOUS

## 2012-10-16 MED ORDER — PANTOPRAZOLE SODIUM 40 MG PO TBEC
40.0000 mg | DELAYED_RELEASE_TABLET | Freq: Once | ORAL | Status: AC
  Administered 2012-10-16: 40 mg via ORAL

## 2012-10-16 MED ORDER — PROMETHAZINE HCL 25 MG/ML IJ SOLN: 6.2500 mg | INTRAMUSCULAR | Status: DC | PRN

## 2012-10-16 MED ORDER — ROCURONIUM BROMIDE 100 MG/10ML IV SOLN
INTRAVENOUS | Status: DC | PRN
  Administered 2012-10-16: 20 mg via INTRAVENOUS

## 2012-10-16 MED ORDER — CEFAZOLIN SODIUM-DEXTROSE 2-3 GM-% IV SOLR
INTRAVENOUS | Status: DC | PRN
  Administered 2012-10-16: 2 g via INTRAVENOUS

## 2012-10-16 SURGICAL SUPPLY — 36 items
ADH SKN CLS APL DERMABOND .7 (GAUZE/BANDAGES/DRESSINGS) ×1
APPLIER CLIP ROT 10 11.4 M/L (STAPLE) ×2
APR CLP MED LRG 11.4X10 (STAPLE) ×1
CANISTER SUCTION 2500CC (MISCELLANEOUS) ×2 IMPLANT
CANNULA ENDOPATH XCEL 11M (ENDOMECHANICALS) IMPLANT
CLIP APPLIE ROT 10 11.4 M/L (STAPLE) IMPLANT
CLOTH BEACON ORANGE TIMEOUT ST (SAFETY) ×2 IMPLANT
DECANTER SPIKE VIAL GLASS SM (MISCELLANEOUS) IMPLANT
DERMABOND ADVANCED (GAUZE/BANDAGES/DRESSINGS) ×1
DERMABOND ADVANCED .7 DNX12 (GAUZE/BANDAGES/DRESSINGS) IMPLANT
DEVICE SUTURE ENDOST 10MM (ENDOMECHANICALS) IMPLANT
DRAPE LAPAROSCOPIC ABDOMINAL (DRAPES) ×2 IMPLANT
DRAPE WARM FLUID 44X44 (DRAPE) ×1 IMPLANT
ELECT REM PT RETURN 9FT ADLT (ELECTROSURGICAL) ×2
ELECTRODE REM PT RTRN 9FT ADLT (ELECTROSURGICAL) ×1 IMPLANT
GLOVE BIOGEL PI IND STRL 7.0 (GLOVE) ×1 IMPLANT
GLOVE BIOGEL PI INDICATOR 7.0 (GLOVE) ×1
GLOVE INDICATOR 8.0 STRL GRN (GLOVE) ×4 IMPLANT
GLOVE SS BIOGEL STRL SZ 8 (GLOVE) ×1 IMPLANT
GLOVE SUPERSENSE BIOGEL SZ 8 (GLOVE) ×1
GOWN STRL NON-REIN LRG LVL3 (GOWN DISPOSABLE) ×3 IMPLANT
GOWN STRL REIN XL XLG (GOWN DISPOSABLE) ×4 IMPLANT
KIT BASIN OR (CUSTOM PROCEDURE TRAY) ×2 IMPLANT
SCALPEL HARMONIC ACE (MISCELLANEOUS) IMPLANT
SET IRRIG TUBING LAPAROSCOPIC (IRRIGATION / IRRIGATOR) IMPLANT
SLEEVE XCEL OPT CAN 5 100 (ENDOMECHANICALS) ×1 IMPLANT
SOLUTION ANTI FOG 6CC (MISCELLANEOUS) ×2 IMPLANT
SUT VIC AB 4-0 PS2 27 (SUTURE) IMPLANT
TOWEL OR 17X26 10 PK STRL BLUE (TOWEL DISPOSABLE) ×2 IMPLANT
TRAY FOLEY CATH 14FRSI W/METER (CATHETERS) ×1 IMPLANT
TRAY LAP CHOLE (CUSTOM PROCEDURE TRAY) ×2 IMPLANT
TROCAR BLADELESS OPT 5 100 (ENDOMECHANICALS) ×1 IMPLANT
TROCAR BLADELESS OPT 5 75 (ENDOMECHANICALS) IMPLANT
TROCAR XCEL BLUNT TIP 100MML (ENDOMECHANICALS) ×2 IMPLANT
TROCAR XCEL NON-BLD 11X100MML (ENDOMECHANICALS) ×1 IMPLANT
TUBING INSUFFLATION 10FT LAP (TUBING) ×2 IMPLANT

## 2012-10-16 NOTE — Brief Op Note (Signed)
10/11/2012 - 10/16/2012  9:45 AM  PATIENT:  Danielle Bruce  52 y.o. female  PRE-OPERATIVE DIAGNOSIS:  abdominal pain  POST-OPERATIVE DIAGNOSIS:  cholelithiasis  PROCEDURE:  Procedure(s): LAPAROSCOPY DIAGNOSTIC (N/A) LAPAROSCOPIC CHOLECYSTECTOMY (N/A) LAPAROSCOPIC LYSIS OF ADHESIONS (N/A)  SURGEON:  Surgeon(s) and Role:    * Maryam Feely A. Kaisei Gilbo, MD - Primary      ANESTHESIA:   local and general  EBL:  Total I/O In: -  Out: 700 [Urine:700]  BLOOD ADMINISTERED:none  DRAINS: none   LOCAL MEDICATIONS USED:  BUPIVICAINE   SPECIMEN:  GALLBLADDER  DISPOSITION OF SPECIMEN:  PATHOLOGY  COUNTS:  YES  TOURNIQUET:  * No tourniquets in log *  DICTATION: .Other Dictation: Dictation Number  [id number]  PLAN OF CARE: Admit to inpatient   PATIENT DISPOSITION:  PACU - hemodynamically stable.   Delay start of Pharmacological VTE agent (>24hrs) due to surgical blood loss or risk of bleeding: no

## 2012-10-16 NOTE — Transfer of Care (Signed)
Immediate Anesthesia Transfer of Care Note  Patient: Danielle Bruce  Procedure(s) Performed: Procedure(s): LAPAROSCOPY DIAGNOSTIC (N/A) LAPAROSCOPIC CHOLECYSTECTOMY (N/A) LAPAROSCOPIC LYSIS OF ADHESIONS (N/A)  Patient Location: PACU  Anesthesia Type:General  Level of Consciousness: sedated  Airway & Oxygen Therapy: Patient Spontanous Breathing and Patient connected to face mask oxygen  Post-op Assessment: Report given to PACU RN and Post -op Vital signs reviewed and stable  Post vital signs: Reviewed and stable  Complications: No apparent anesthesia complications

## 2012-10-16 NOTE — Op Note (Signed)
NAME:  Danielle Bruce, Danielle Bruce NO.:  [id number]  MEDICAL RECORD NO.:  [id number]  LOCATION:  1535                         FACILITY:  [facility]  PHYSICIAN:  Maisie Fus A. Sederick Jacobsen, M.D.DATE OF BIRTH:  [birth year]  DATE OF PROCEDURE: DATE OF DISCHARGE:                              OPERATIVE REPORT   PREOPERATIVE DIAGNOSES: 1. Abdominal pain of unknown etiology, despite negative workup. 2. A 5 mm gallstone.  POSTOPERATIVE DIAGNOSES: 1. Abdominal pain of unknown etiology, despite negative workup. 2. A 5 mm gallstone.  PROCEDURES: 1. Laparoscopic cholecystectomy. 2. Diagnostic laparoscopy. 3. Lysis of adhesions.  SURGEON:  Maisie Fus A. Yasuo Phimmasone, M.D.  ANESTHESIA:  General endotracheal anesthesia.  ESTIMATED BLOOD LOSS:  Minimal.  SPECIMEN:  Gallbladder to pathology.  DRAINS:  None.  IV FLUIDS:  700 mL.  INDICATIONS FOR PROCEDURE:  The patient is a 52 year old female, admitted 5 days ago with diffuse abdominal pain.  CT scan showed some suggestion of possible early acute appendicitis, but she was watched by Dr. Biagio Quint and put in the hospital.  She continued to have severe enough abdominal pain requiring narcotics.  She has multiple medical problems and severe anxiety as well as a history of abnormal Pap smears.  She was having abdominal pain in her pelvis, periumbilical and epigastric.  It was difficult to discern her pain despite her normal examination and normal white count.  CT scan showed the above.  Ultrasound showed a possible 5 mm gallstone.  HIDA was done, which was normal.  No obvious cause of her pain could be discerned, there was concern for this to be secondary to anxiety or other psychiatric disease.  I had a lengthy discussion with the patient and her family.  I discussed that her pain is not consistent with gallstone disease and removing the gallbladder only had about 10% chance of working and resolving her symptoms.  I offered a diagnostic laparoscopy to  further evaluate for intra-abdominal pain.  Given her benign exam, I told them there is about 10% chance this would be helpful with a risk of 1% of injuring the bowel or causing some other intra-abdominal complication from the procedure.  I also discussed the need to potentially remove her appendix and gallbladder depending on what is found and more surgery depending on what was found depending on what the small bowel look quite, the colon look like in the pelvic organs.  We had a lengthy discussion about the pros and cons of doing this in fact that the likelihood of success was maybe 10% at relieving her symptoms.  I discussed other options of further psychiatric care and medication review as well as a gynecological consultation.  They agreed that the gynecologic consultation and medical review would be helpful, but still wanted to proceed laparoscopy since we did not have etiology and this was at least closed that door and take care of issues before working the patient up further.  Risk of bleeding, infection, common bile duct injury, bowel injury, death, DVT, intra-abdominal sepsis, organ failure, and need for much larger operations discussed with the patient and family at great length.  Despite the above, they wished to proceed.  DESCRIPTION OF PROCEDURE:  The  patient was met in the holding area and questions were answered.  The patient was taken back to the operating room and placed supine on the operating room table.  After induction of general anesthesia, the abdomen was prepped and draped in sterile fashion.  Foley catheter was placed without difficulty under sterile conditions.  After time-out was done, she received 3 g of Ancef prior to that.  A 1 cm infraumbilical incision was made.  Dissection was carried down to the midline, the fascia was opened with scalpel blade.  The peritoneum was opened with a Kelly clamp.  We then placed a pursestring suture of 0 Vicryl around the  opening and placed a 12 mm Hasson cannula under direct vision.  Pneumoperitoneum was created to 15 mmHg CO2 pressure.  She was placed in Trendelenburg 1st.  The right upper quadrant 5 mm port was inserted under direct vision.  Reinspected her pelvis.  Uterus was normal.  Left ovary normal.  Left ovarian tube or left fallopian tube normal.  Right ovary normal.  Right fallopian tube normal.  No evidence of endometriosis, ovarian cyst or uterine fibroids noted upon inspection.  No significant adhesions from C-section.  Rectum was normal.  Sigmoid colon normal.  Descending colon normal.  Transverse colon normal and ascending colon normal.  Small bowel was grossly normal without signs of Crohn's disease, enteritis or a diverticulum.  She was then placed in a head-up position.  Liver was normal.  Stomach was normal.  Her gallbladder had some filmy adhesions to it and the dome had a slight white-tinged to it.  The remainder of it was Robin egg blue. There was some suggestion that she may have had some gallbladder attacks with a history of a very small stone, this was the only abnormality found, but this was quite minimal.  Given the family's concern and anxiety about her having a gallstone, I felt that cholecystectomy would be warranted given the intraoperative findings, as well as the family is concerned about this issue.  A 5 mm port was placed in the right upper quadrant in the midclavicular line just below the right costal margin. An 11 mm subxiphoid port was placed under direct vision.  The dome was grasped and the filmy adhesions of the gallbladder to the transverse colon were taken down carefully and sharply with minimal cautery.  The infundibulum was then identified and grasped with a 2nd grasper and pulled to the patient's right lower quadrant thus exposing the triangle of Calot.  I was able to dissect around the cystic artery and cystic duct, and also achieving the critical view.  Clips  were placed on both these structures and they were divided.  Common duct was visualized, had mild dilatation noted to it and was well away from the operative field. Cautery is then used to dissect the gallbladder from the gallbladder fossa without difficulty.  The gallbladder was then placed in EndoCatch bag.  Gallbladder bed was made hemostatic with cautery.  This was irrigated and there is no signs of bleeding or bile leakage.  Four- quadrant laparoscopy was done, which showed no evidence of injury to the colon or small bowel or other internal viscera at this point in time. The gallbladder was extracted out the umbilical incision with an EndoCatch bag and passed off the field.  Of note, I could not palpate any stones in the gallbladder, but this was sent to the lab for further evaluation.  She had any form of acalculous cholecystitis as the  cause of her pain.  After inspecting the abdomen, I closed the fascial incision under direct vision with a 0 Vicryl already in place.  Ports were then removed after suction out any excess irrigation and passed off the field.  Skin incision was closed with 4-0 Monocryl and Dermabond.  All final counts of sponge, needle, and instruments found to be correct at this portion of the case.  The patient was then extubated, taken to recovery in satisfactory condition.  Foley catheter removed at the end of the case.     Jorden Mahl A. Dovey Fatzinger, M.D.     TAC/MEDQ  D:  10/16/2012  T:  10/16/2012  Job:  [id number]

## 2012-10-16 NOTE — Anesthesia Postprocedure Evaluation (Signed)
  Anesthesia Post-op Note  Patient: Danielle Bruce  Procedure(s) Performed: Procedure(s) (LRB): LAPAROSCOPY DIAGNOSTIC (N/A) LAPAROSCOPIC CHOLECYSTECTOMY (N/A) LAPAROSCOPIC LYSIS OF ADHESIONS (N/A)  Patient Location: PACU  Anesthesia Type: General  Level of Consciousness: awake and alert   Airway and Oxygen Therapy: Patient Spontanous Breathing  Post-op Pain: mild  Post-op Assessment: Post-op Vital signs reviewed, Patient's Cardiovascular Status Stable, Respiratory Function Stable, Patent Airway and No signs of Nausea or vomiting  Last Vitals:  Filed Vitals:   10/16/12 1315  BP: 116/58  Pulse: 68  Temp: 36.8 C  Resp: 18    Post-op Vital Signs: stable   Complications: No apparent anesthesia complications

## 2012-10-16 NOTE — Anesthesia Preprocedure Evaluation (Addendum)
Anesthesia Evaluation  Patient identified by MRN, date of birth, ID band Patient awake    Reviewed: Allergy & Precautions, H&P , NPO status , Patient's Chart, lab work & pertinent test results  Airway Mallampati: II TM Distance: >3 FB Neck ROM: Full    Dental no notable dental hx.    Pulmonary neg pulmonary ROS, Current Smoker,  breath sounds clear to auscultation  Pulmonary exam normal       Cardiovascular negative cardio ROS  Rhythm:Regular Rate:Normal     Neuro/Psych negative neurological ROS  negative psych ROS   GI/Hepatic negative GI ROS, Neg liver ROS,   Endo/Other  negative endocrine ROS  Renal/GU negative Renal ROS  negative genitourinary   Musculoskeletal negative musculoskeletal ROS (+)   Abdominal   Peds negative pediatric ROS (+)  Hematology negative hematology ROS (+)   Anesthesia Other Findings   Reproductive/Obstetrics negative OB ROS                           Anesthesia Physical Anesthesia Plan  ASA: II  Anesthesia Plan: General   Post-op Pain Management:    Induction: Intravenous  Airway Management Planned: Oral ETT  Additional Equipment:   Intra-op Plan:   Post-operative Plan: Extubation in OR  Informed Consent: I have reviewed the patients History and Physical, chart, labs and discussed the procedure including the risks, benefits and alternatives for the proposed anesthesia with the patient or authorized representative who has indicated his/her understanding and acceptance.   Dental advisory given  Plan Discussed with: CRNA  Anesthesia Plan Comments:         Anesthesia Quick Evaluation  

## 2012-10-16 NOTE — Progress Notes (Signed)
For laparoscopy to further evaluate pain

## 2012-10-16 NOTE — Interval H&P Note (Signed)
History and Physical Interval Note:  10/16/2012 8:28 AM  Danielle Bruce  has presented today for surgery, with the diagnosis of abdominal pain  The various methods of treatment have been discussed with the patient and family. After consideration of risks, benefits and other options for treatment, the patient has consented to  Procedure(s): LAPAROSCOPY DIAGNOSTIC (N/A) as a surgical intervention .  The patient's history has been reviewed, patient examined, no change in status, stable for surgery.  I have reviewed the patient's chart and labs.  Questions were answered to the patient's satisfaction.     Ramsey Guadamuz A.

## 2012-10-17 ENCOUNTER — Encounter (HOSPITAL_COMMUNITY): Payer: Self-pay | Admitting: Surgery

## 2012-10-17 MED ORDER — ONDANSETRON HCL 4 MG PO TABS
4.0000 mg | ORAL_TABLET | Freq: Four times a day (QID) | ORAL | Status: DC | PRN
  Administered 2012-10-17 – 2012-10-18 (×3): 4 mg via ORAL
  Filled 2012-10-17 (×3): qty 1

## 2012-10-17 MED ORDER — BISACODYL 10 MG RE SUPP
10.0000 mg | Freq: Once | RECTAL | Status: AC
  Administered 2012-10-17: 10 mg via RECTAL
  Filled 2012-10-17: qty 1

## 2012-10-17 MED ORDER — DOCUSATE SODIUM 100 MG PO CAPS
100.0000 mg | ORAL_CAPSULE | Freq: Two times a day (BID) | ORAL | Status: DC
  Administered 2012-10-17 – 2012-10-18 (×3): 100 mg via ORAL
  Filled 2012-10-17 (×4): qty 1

## 2012-10-17 MED ORDER — POLYETHYLENE GLYCOL 3350 17 G PO PACK
17.0000 g | PACK | Freq: Two times a day (BID) | ORAL | Status: DC
  Administered 2012-10-17 – 2012-10-18 (×3): 17 g via ORAL
  Filled 2012-10-17 (×4): qty 1

## 2012-10-17 NOTE — Progress Notes (Signed)
1 Day Post-Op  Subjective: Pt notes the pain she was feeling is resolved and now she has incisional pain.  She has some nausea, but no vomiting.  Pt tolerating some full liquid type diet.  Pain well controlled with medications.    Objective: Vital signs in last 24 hours: Temp:  [97.4 F (36.3 C)-98.3 F (36.8 C)] 98.1 F (36.7 C) (08/21 0450) Pulse Rate:  [55-86] 69 (08/21 0450) Resp:  [10-18] 18 (08/21 0450) BP: (92-139)/(46-71) 104/64 mmHg (08/21 0450) SpO2:  [96 %-100 %] 99 % (08/21 0450) Last BM Date: 10/15/12  Intake/Output from previous day: 08/20 0701 - 08/21 0700 In: 3541.3 [P.O.:120; I.V.:3421.3] Out: 6350 [Urine:6350] Intake/Output this shift:    PE: Gen:  Alert, NAD, pleasant Abd: Soft, abdomen very sensitive to light touch, mildly distended, +BS, incisions C/D/I   Lab Results:  No results found for this basename: WBC, HGB, HCT, PLT,  in the last 72 hours BMET No results found for this basename: NA, K, CL, CO2, GLUCOSE, BUN, CREATININE, CALCIUM,  in the last 72 hours PT/INR No results found for this basename: LABPROT, INR,  in the last 72 hours CMP     Component Value Date/Time   NA 140 10/11/2012 1309   K 4.4 10/11/2012 1309   CL 106 10/11/2012 1309   CO2 23 10/11/2012 1309   GLUCOSE 100* 10/11/2012 1309   BUN 13 10/11/2012 1309   CREATININE 0.63 10/11/2012 1309   CALCIUM 9.7 10/11/2012 1309   PROT 6.9 10/11/2012 1309   ALBUMIN 4.1 10/11/2012 1309   AST 32 10/11/2012 1309   ALT 35 10/11/2012 1309   ALKPHOS 57 10/11/2012 1309   BILITOT 0.5 10/11/2012 1309   GFRNONAA >90 10/11/2012 1309   GFRAA >90 10/11/2012 1309   Lipase  No results found for this basename: lipase       Studies/Results: Nm Hepato W/eject Fract  10/15/2012   *RADIOLOGY REPORT*  Clinical Data:  Abdominal pain  NUCLEAR MEDICINE HEPATOBILIARY IMAGING WITH GALLBLADDER EF:  Technique: Sequential images of the abdomen were obtained for 60 minutes following intravenous administration of  radiopharmaceutical.  Patient then received an infusion of 0.02 ugm/kg of CCK analog intravenously over 30 minutes, and imaging was continued for 30 minutes.  A time-activity curve was generated from tracer within the gallbladder following CCK administration, and the gallbladder ejection fraction was calculated.  Radiopharmaceutical:  5.5 mCi Tc-68m mebrofenin Pharmaceutical:  1.0 mcg CCK  Comparison: None  Findings: Prompt tracer extraction from bloodstream, indicating normal hepatocellular function. Prompt excretion of tracer into biliary tree. Gallbladder visualized at 7 minutes. Small bowel visualized at 18 minutes. No hepatic retention of tracer.  Subjectively normal emptying of tracer from gallbladder following CCK stimulation. Calculated gallbladder ejection fraction is 81%, normal. Patient experienced no symptoms following CCK administration.  IMPRESSION: Normal exam.  Normal values for gallbladder ejection fraction: > 30% for exams utilizing sincalide (CCK) > 33% for exams utilizing Ensure Plus stimulation   Original Report Authenticated By: Ulyses Southward, M.D.    Anti-infectives: Anti-infectives   None       Assessment/Plan Abdominal pain of undetermined etiology  Cholelithiasis without cholecystitis  Anxiety/Depression - ?somatoform vs conversion Constipation    Plan:  1. POD #1 s/p Diag lap performed yesterday, mildly abnormal GB - lap chole and LOA, pending surgical path of GB 2.  The patients seems much improved today, and is only having incisional type pain now 3.  Work towards discharge for tomorrow, wean narcotics, switch to orals,  wean zofran 4.  We have ruled out causes of intrabdominal disease.  She will need to work with her PCP, GI, and Psychiatrist/psychologist upon discharge  5.  Pt pending a visit from her dog and/or therapy dog, told to make sure to bring her vaccine paperwork.     LOS: 6 days    Aris Georgia 10/17/2012, 7:27 AM Pager: [phone]

## 2012-10-17 NOTE — Progress Notes (Signed)
Hopefully better.  D/C in am.

## 2012-10-18 DIAGNOSIS — R110 Nausea: Secondary | ICD-10-CM | POA: Diagnosis present

## 2012-10-18 DIAGNOSIS — F329 Major depressive disorder, single episode, unspecified: Secondary | ICD-10-CM | POA: Diagnosis present

## 2012-10-18 DIAGNOSIS — R109 Unspecified abdominal pain: Secondary | ICD-10-CM | POA: Diagnosis present

## 2012-10-18 DIAGNOSIS — K811 Chronic cholecystitis: Secondary | ICD-10-CM | POA: Diagnosis present

## 2012-10-18 DIAGNOSIS — F3181 Bipolar II disorder: Secondary | ICD-10-CM | POA: Diagnosis present

## 2012-10-18 DIAGNOSIS — K5900 Constipation, unspecified: Secondary | ICD-10-CM | POA: Diagnosis present

## 2012-10-18 DIAGNOSIS — F32A Depression, unspecified: Secondary | ICD-10-CM | POA: Diagnosis present

## 2012-10-18 HISTORY — DX: Nausea: R11.0

## 2012-10-18 HISTORY — DX: Unspecified abdominal pain: R10.9

## 2012-10-18 MED ORDER — ONDANSETRON HCL 4 MG PO TABS: 4.0000 mg | ORAL_TABLET | Freq: Three times a day (TID) | ORAL | Status: DC | PRN

## 2012-10-18 MED ORDER — OXYCODONE-ACETAMINOPHEN 5-325 MG PO TABS: 2.0000 | ORAL_TABLET | Freq: Four times a day (QID) | ORAL | Status: DC | PRN

## 2012-10-18 NOTE — Discharge Summary (Signed)
Improved.  Ready for discharge.

## 2012-10-18 NOTE — Discharge Summary (Signed)
Physician Discharge Summary  Patient ID: Danielle Bruce MRN: [id number] DOB/AGE: 03/20/1960 52 y.o.  Admit date: 10/11/2012 Discharge date: 10/18/2012  Admitting Diagnosis: Abdominal pain of unknown etiology Nausea Biliary colic Constipation Anxiety/Depression  Discharge Diagnosis Patient Active Problem List   Diagnosis Date Noted  . Chronic cholecystitis without calculus 10/18/2012  . Abdominal pain, unspecified site 10/18/2012  . Nausea alone 10/18/2012  . Unspecified constipation 10/18/2012  . Depression 10/18/2012  . Bipolar 2 disorder 10/18/2012  . Anxiety   . ASCUS (atypical squamous cells of undetermined significance) on Pap smear   . IUD   . Hemorrhoids 11/29/2010  . Abdominal pain, left upper quadrant 11/29/2010    Consultants [facility] (Dr. Malon Kindle.)  Imaging: No results found.  Procedures Dr. Luisa Hart (10/16/12) - Diagnostic laparoscopy, Laparoscopic Cholecystectomy, Lysis of adhesions  Pathology Report:AL DIAGNOSIS Diagnosis: Gallbladder - SLIGHT CHRONIC CHOLECYSTITIS. 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 Location is pelvic region. Onset was 3 days ago. Coarse is worsening. Associated with nausea. Characterized as constant "band like" pain which she initially thought was from menstrual cramps(has IUD and does not have periods). No aggravating factors. Alleviating factors; fetal position and minimized with pain medication. She denies vomiting, diarrhea or constipation. Last BM was last night. She has not been sexually active in 2 years. Denies vaginal discharge or dysuria. She has a history of ovarian cysts. She was seen by PCP yesterday for this, UA was done.   CT, and ultrasound were performed and showed only questionable  cholelithiasis but no signs of cholecystitis and the CT showed questionable appendicitis.  After surgeon evaluation of the CT they were not concerned that the appendix was affected and we obtained a HIDA scan to rule out cholecystitis.  HIDA scan proved normal with normal ejection fraction.  Lab workup proved to be normal.  Dr. Randa Evens was consulted from GI and recommended a bowel prep to see if constipation from her slowed bowel transit would resolve her pain.  However, this pain persisted.  Dr. Luisa Hart offered the patient a diagnostic laparoscopy to further evaluate her pain.  Patient underwent procedure listed above and the pathology report showed slight chronic cholecystitis without calculi.  Tolerated procedure well and was transferred to the floor.  Diet was advanced as tolerated.  On POD2, the patient was voiding well, tolerating diet, ambulating well, pain well controlled, vital signs stable, incisions c/d/i and felt stable for discharge home.  Patient will follow up in our office in 3 weeks and knows to call with questions or concerns.  Physical Exam: General:  Alert, NAD, pleasant, comfortable Abd:  Soft, ND, appropriate mild tenderness, incisions C/D/I    Medication List         albuterol 108 (90 BASE) MCG/ACT inhaler  Commonly known as:  PROVENTIL HFA;VENTOLIN HFA  Inhale 2 puffs into the lungs every 6 (six) hours as needed for wheezing or shortness of breath.     beclomethasone 40 MCG/ACT inhaler  Commonly known as:  QVAR  Inhale 2 puffs into the lungs 2 (two) times daily.     CALCIUM + D PO  Take 1 tablet by mouth daily.     CRANBERRY EXTRACT PO  Take 1 capsule by mouth daily.     diphenhydrAMINE 25 mg capsule  Commonly known as:  BENADRYL  Take 50 mg by mouth every 6 (six) hours as needed for itching or allergies.     docusate sodium 100 MG capsule  Commonly known as:  COLACE  Take 100 mg by mouth 2 (two) times daily as needed for constipation.     esomeprazole 40 MG  capsule  Commonly known as:  NEXIUM  Take 40 mg by mouth 2 (two) times daily.     fexofenadine 180 MG tablet  Commonly known as:  ALLEGRA  Take 180 mg by mouth daily.     fluvoxaMINE 50 MG tablet  Commonly known as:  LUVOX  Take 50 mg by mouth at bedtime.     gabapentin 300 MG capsule  Commonly known as:  NEURONTIN  Take 600 mg by mouth 2 (two) times daily.     lamoTRIgine 200 MG tablet  Commonly known as:  LAMICTAL  Take 200 mg by mouth at bedtime.     levocetirizine 5 MG tablet  Commonly known as:  XYZAL  Take 5 mg by mouth every evening.     levonorgestrel 20 MCG/24HR IUD  Commonly known as:  MIRENA  1 each by Intrauterine route once.     liothyronine 25 MCG tablet  Commonly known as:  CYTOMEL  Take 25 mcg by mouth daily.     LORazepam 1 MG tablet  Commonly known as:  ATIVAN  Take 1 mg by mouth every 8 (eight) hours as needed for anxiety.     MAXALT PO  Take 1 tablet by mouth every 2 (two) hours as needed. For migraines.     meclizine 25 MG tablet  Commonly known as:  ANTIVERT  Take 25 mg by mouth 3 (three) times daily as needed for dizziness.     Melatonin 5 MG Caps  Take 1 capsule by mouth at bedtime as needed (for sleep).     montelukast 10 MG tablet  Commonly known as:  SINGULAIR  Take 10 mg by mouth at bedtime.     multivitamin with minerals Tabs tablet  Take 1 tablet by mouth at bedtime.     ondansetron 4 MG tablet  Commonly known as:  ZOFRAN  Take 1 tablet (4 mg total) by mouth every 8 (eight) hours as needed.     oxyCODONE-acetaminophen 5-325 MG per tablet  Commonly known as:  PERCOCET/ROXICET  Take 2 tablets by mouth every 6 (six) hours as needed for pain.     polyethylene glycol packet  Commonly known as:  MIRALAX / GLYCOLAX  Take 17 g by mouth 2 (two) times daily as needed (for constipation).     promethazine 25 MG tablet  Commonly known as:  PHENERGAN  Take 25 mg by mouth every 6 (six) hours as needed for nausea.     RED YEAST RICE  PO  Take 1 capsule by mouth daily.     senna 8.6 MG Tabs tablet  Commonly known as:  SENOKOT  Take 1 tablet by mouth at bedtime.     topiramate 25 MG tablet  Commonly known as:  TOPAMAX  Take 50 mg by mouth at bedtime.     traZODone 50 MG tablet  Commonly known as:  DESYREL  Take 50 mg by mouth at bedtime.         Follow-up Information   Follow up with [facility] On 11/05/2012. (Your appointment is at 1:00PM, please arrive at least 30 minutes before your appointment to complete your check in paperwork.  If you are unable to arrive 30 minutes prior to your appointment time we may have to cancel or reschedule you.)    Contact information:   [address]   [place] [address] [phone]       Follow up with Lupita Raider, MD.   Specialty:  Family Medicine   Contact information:   [address] [place] [address] [phone]       Follow up with [facility] INPATIENT/OUTPATIENT M, MD. (FOLLOW UP WITH YOUR PSYCHIATRIST UPON DISCHARGE)    Specialty:  Psychiatry      Signed: Aris Georgia, [facility] [phone]  10/18/2012, 7:58 AM

## 2012-10-29 DIAGNOSIS — J309 Allergic rhinitis, unspecified: Secondary | ICD-10-CM | POA: Diagnosis not present

## 2012-11-05 ENCOUNTER — Encounter (INDEPENDENT_AMBULATORY_CARE_PROVIDER_SITE_OTHER): Payer: Self-pay | Admitting: Internal Medicine

## 2012-11-05 ENCOUNTER — Ambulatory Visit (INDEPENDENT_AMBULATORY_CARE_PROVIDER_SITE_OTHER): Payer: Medicare Other | Admitting: Internal Medicine

## 2012-11-05 VITALS — BP 108/74 | HR 63 | Temp 96.1°F | Resp 14 | Ht 62.0 in | Wt 108.4 lb

## 2012-11-05 DIAGNOSIS — K811 Chronic cholecystitis: Secondary | ICD-10-CM

## 2012-11-05 NOTE — Patient Instructions (Addendum)
May resume regular activity without restrictions. Follow up as needed. Call with questions or concerns.  

## 2012-11-05 NOTE — Progress Notes (Signed)
  Subjective: Pt returns to the clinic today after undergoing laparoscopic cholecystectomy with LOA on 10/16/12.  The patient is tolerating their diet well and is having no severe pain.  Bowel function is good.  No problems with the wounds.  Objective: Vital signs in last 24 hours: Reviewed  PE: Abd: soft, non-tender, +bs, incisions well healed  Lab Results:  No results found for this basename: WBC, HGB, HCT, PLT,  in the last 72 hours BMET No results found for this basename: NA, K, CL, CO2, GLUCOSE, BUN, CREATININE, CALCIUM,  in the last 72 hours PT/INR No results found for this basename: LABPROT, INR,  in the last 72 hours CMP     Component Value Date/Time   NA 140 10/11/2012 1309   K 4.4 10/11/2012 1309   CL 106 10/11/2012 1309   CO2 23 10/11/2012 1309   GLUCOSE 100* 10/11/2012 1309   BUN 13 10/11/2012 1309   CREATININE 0.63 10/11/2012 1309   CALCIUM 9.7 10/11/2012 1309   PROT 6.9 10/11/2012 1309   ALBUMIN 4.1 10/11/2012 1309   AST 32 10/11/2012 1309   ALT 35 10/11/2012 1309   ALKPHOS 57 10/11/2012 1309   BILITOT 0.5 10/11/2012 1309   GFRNONAA >90 10/11/2012 1309   GFRAA >90 10/11/2012 1309   Lipase  No results found for this basename: lipase       Studies/Results: No results found.  Anti-infectives: Anti-infectives   None       Assessment/Plan  1.  S/P Laparoscopic Cholecystectomy: doing well, may resume regular activity without restrictions, Pt will follow up with [place] [facility] and knows to call with questions or concerns.     WHITE, ELIZABETH 11/05/2012

## 2012-11-07 DIAGNOSIS — J309 Allergic rhinitis, unspecified: Secondary | ICD-10-CM | POA: Diagnosis not present

## 2012-11-13 DIAGNOSIS — J309 Allergic rhinitis, unspecified: Secondary | ICD-10-CM | POA: Diagnosis not present

## 2012-11-18 ENCOUNTER — Emergency Department (HOSPITAL_COMMUNITY)
Admission: EM | Admit: 2012-11-18 | Discharge: 2012-11-18 | Disposition: A | Discharge status: Home / Self Care | Payer: Medicare Other | Attending: Emergency Medicine | Admitting: Emergency Medicine

## 2012-11-18 ENCOUNTER — Encounter (HOSPITAL_COMMUNITY): Payer: Self-pay | Admitting: Family Medicine

## 2012-11-18 DIAGNOSIS — K219 Gastro-esophageal reflux disease without esophagitis: Secondary | ICD-10-CM | POA: Diagnosis not present

## 2012-11-18 DIAGNOSIS — Z79899 Other long term (current) drug therapy: Secondary | ICD-10-CM | POA: Diagnosis not present

## 2012-11-18 DIAGNOSIS — F430 Acute stress reaction: Secondary | ICD-10-CM

## 2012-11-18 DIAGNOSIS — F411 Generalized anxiety disorder: Secondary | ICD-10-CM | POA: Diagnosis not present

## 2012-11-18 DIAGNOSIS — R1011 Right upper quadrant pain: Secondary | ICD-10-CM | POA: Insufficient documentation

## 2012-11-18 DIAGNOSIS — Z8619 Personal history of other infectious and parasitic diseases: Secondary | ICD-10-CM | POA: Diagnosis not present

## 2012-11-18 DIAGNOSIS — F17200 Nicotine dependence, unspecified, uncomplicated: Secondary | ICD-10-CM | POA: Diagnosis not present

## 2012-11-18 DIAGNOSIS — Z8669 Personal history of other diseases of the nervous system and sense organs: Secondary | ICD-10-CM | POA: Diagnosis not present

## 2012-11-18 DIAGNOSIS — R079 Chest pain, unspecified: Secondary | ICD-10-CM | POA: Insufficient documentation

## 2012-11-18 DIAGNOSIS — IMO0002 Reserved for concepts with insufficient information to code with codable children: Secondary | ICD-10-CM | POA: Diagnosis not present

## 2012-11-18 DIAGNOSIS — R1084 Generalized abdominal pain: Secondary | ICD-10-CM | POA: Diagnosis not present

## 2012-11-18 DIAGNOSIS — K2970 Gastritis, unspecified, without bleeding: Secondary | ICD-10-CM | POA: Diagnosis not present

## 2012-11-18 DIAGNOSIS — R109 Unspecified abdominal pain: Secondary | ICD-10-CM

## 2012-11-18 DIAGNOSIS — K5900 Constipation, unspecified: Secondary | ICD-10-CM | POA: Diagnosis not present

## 2012-11-18 DIAGNOSIS — R110 Nausea: Secondary | ICD-10-CM | POA: Diagnosis not present

## 2012-11-18 DIAGNOSIS — F3189 Other bipolar disorder: Secondary | ICD-10-CM | POA: Insufficient documentation

## 2012-11-18 DIAGNOSIS — R1013 Epigastric pain: Secondary | ICD-10-CM | POA: Diagnosis not present

## 2012-11-18 DIAGNOSIS — R1012 Left upper quadrant pain: Secondary | ICD-10-CM | POA: Insufficient documentation

## 2012-11-18 DIAGNOSIS — G43909 Migraine, unspecified, not intractable, without status migrainosus: Secondary | ICD-10-CM | POA: Diagnosis not present

## 2012-11-18 LAB — CBC WITH DIFFERENTIAL/PLATELET
Basophils Absolute: 0 10*3/uL (ref 0.0–0.1)
Basophils Relative: 0 % (ref 0–1)
Eosinophils Absolute: 0.1 10*3/uL (ref 0.0–0.7)
Eosinophils Relative: 1 % (ref 0–5)
HCT: 35.3 % — ABNORMAL LOW (ref 36.0–46.0)
Hemoglobin: 12.6 g/dL (ref 12.0–15.0)
Lymphocytes Relative: 17 % (ref 12–46)
Lymphs Abs: 1.3 10*3/uL (ref 0.7–4.0)
MCH: 32.1 pg (ref 26.0–34.0)
MCHC: 35.7 g/dL (ref 30.0–36.0)
MCV: 90.1 fL (ref 78.0–100.0)
Monocytes Absolute: 0.6 10*3/uL (ref 0.1–1.0)
Monocytes Relative: 7 % (ref 3–12)
Neutro Abs: 5.9 10*3/uL (ref 1.7–7.7)
Neutrophils Relative %: 75 % (ref 43–77)
Platelets: 167 10*3/uL (ref 150–400)
RBC: 3.92 MIL/uL (ref 3.87–5.11)
RDW: 12.3 % (ref 11.5–15.5)
WBC: 7.9 10*3/uL (ref 4.0–10.5)

## 2012-11-18 LAB — TROPONIN I: Troponin I: <0.3 ng/mL (ref <0.30)

## 2012-11-18 LAB — POCT I-STAT, CHEM 8
BUN: 13 mg/dL (ref 6–23)
Calcium, Ion: 1.2 mmol/L (ref 1.12–1.23)
Chloride: 104 mEq/L (ref 96–112)
Creatinine, Ser: 0.5 mg/dL (ref 0.50–1.10)
Glucose, Bld: 112 mg/dL — ABNORMAL HIGH (ref 70–99)
HCT: 36 % (ref 36.0–46.0)
Hemoglobin: 12.2 g/dL (ref 12.0–15.0)
Potassium: 3.3 mEq/L — ABNORMAL LOW (ref 3.5–5.1)
Sodium: 138 mEq/L (ref 135–145)
TCO2: 22 mmol/L (ref 0–100)

## 2012-11-18 MED ORDER — LORAZEPAM 2 MG/ML IJ SOLN
1.0000 mg | Freq: Once | INTRAMUSCULAR | Status: AC
  Administered 2012-11-18: 1 mg via INTRAVENOUS
  Filled 2012-11-18: qty 1

## 2012-11-18 MED ORDER — SODIUM CHLORIDE 0.9 % IV SOLN
Freq: Once | INTRAVENOUS | Status: AC
  Administered 2012-11-18: 22:00:00 via INTRAVENOUS

## 2012-11-18 MED ORDER — PROMETHAZINE HCL 25 MG PO TABS
25.0000 mg | ORAL_TABLET | Freq: Once | ORAL | Status: AC
  Administered 2012-11-18: 25 mg via ORAL
  Filled 2012-11-18 (×2): qty 1

## 2012-11-18 NOTE — ED Notes (Signed)
PA at bedside.

## 2012-11-18 NOTE — ED Notes (Addendum)
Pt presents via [facility] from home with c/o bilateral upper abdominal pain that she rates as an 8/10. Pt had Gall bladder removed in August. Pt reports taking 1 percocet this evening 1830 for headache.

## 2012-11-18 NOTE — ED Provider Notes (Signed)
CSN: [id number]     Arrival date & time 11/18/12  2035 History   First MD Initiated Contact with Patient 11/18/12 2038     Chief Complaint  Patient presents with  . Abdominal Pain   (Consider location/radiation/quality/duration/timing/severity/associated sxs/prior Treatment) HPI Comments: Patient states, that this morning.  She had a migraine headache, and she took one Percocet, but didn't take her normal.  Phenergan, with because she was due to meal at the shelter.  While there she found out that one of her children's friends, was killed last night, and then.  She had a discussion/margin it with one of her children.  Shortly thereafter, she developed epigastric right upper quadrant, and left upper quadrant, pain, and severe nausea.  This was for 15 minutes.  Prior to arrival in the emergency department.  She is quite anxious.  On examination, very tearful.  He, states she's had panic attacks before, but never associated with pain.  She really recently had a cholecystectomy on August 25th.  She's had no recurrent biliary colic symptoms since the surgery.  States she's been eating, and sleeping well  Patient is a 52 y.o. female presenting with abdominal pain. The history is provided by the patient.  Abdominal Pain Pain location:  Epigastric, LUQ and RUQ Pain quality: aching   Pain radiates to:  Chest Pain severity:  Severe Onset quality:  Gradual Duration: 15 minutes. Timing:  Constant Progression:  Waxing and waning Chronicity:  New Context: previous surgery   Context: not eating, not recent illness, not retching and not trauma   Relieved by:  None tried Worsened by:  Nothing tried Associated symptoms: chest pain and nausea   Associated symptoms: no chills, no constipation, no cough, no diarrhea, no dysuria, no fever and no vomiting     Past Medical History  Diagnosis Date  . GERD (gastroesophageal reflux disease)   . 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 Migraines   . Bipolar 2 disorder   .  IBS (irritable bowel syndrome)   . Constipation   . ASCUS (atypical squamous cells of undetermined significance) on Pap smear 05/06/05    NEG HIGH RISK HPV--C&B BIOPSY BENIGN 12/2005  . Anxiety   . 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    cytology negative   Past Surgical History  Procedure Laterality Date  . Hemorrhoid surgery  1993    x3  . Cesarean section  [id number]  . Shoulder surgery  2007/2008  . Spine surgery  2010    cervical  . Dilation and curettage of uterus    . Pelvic laparoscopy    . Intrauterine device insertion  01/04/2010    MIRENA  . Laparoscopy N/A 10/16/2012    Procedure: LAPAROSCOPY DIAGNOSTIC;  Surgeon: Clovis Pu. Cornett, MD;  Location: [facility];  Service: General;  Laterality: N/A;  . Cholecystectomy N/A 10/16/2012    Procedure: LAPAROSCOPIC CHOLECYSTECTOMY;  Surgeon: Clovis Pu. Cornett, MD;  Location: [facility];  Service: General;  Laterality: N/A;  . Laparoscopic lysis of adhesions N/A 10/16/2012    Procedure: LAPAROSCOPIC LYSIS OF ADHESIONS;  Surgeon: Clovis Pu. Cornett, MD;  Location: [facility];  Service: General;  Laterality: N/A;   Family History  Problem Relation Age of Onset  . Diabetes Father   . Hypertension Father   . Hyperlipidemia Father   . Heart disease Maternal Grandfather   . Heart disease Paternal Grandmother   . Heart disease Paternal Grandfather    History  Substance Use Topics  . Smoking status: Current  Some Day Smoker    Types: Cigarettes  . Smokeless tobacco: Never Used  . Alcohol Use: Yes     Comment: rare   OB History   Grav Para Term Preterm Abortions TAB SAB Ect Mult Living   4 3 3  1  1   3      Review of Systems  Constitutional: Negative for fever and chills.  Respiratory: Negative for cough.   Cardiovascular: Positive for chest pain.  Gastrointestinal: Positive for nausea and abdominal pain. Negative for vomiting, diarrhea and constipation.  Genitourinary: Negative for dysuria.  Psychiatric/Behavioral: The patient is  nervous/anxious.   All other systems reviewed and are negative.    Allergies  Sulfa antibiotics; Flagyl; and Morphine and related  Home Medications   Current Outpatient Rx  Name  Route  Sig  Dispense  Refill  . albuterol (PROVENTIL HFA;VENTOLIN HFA) 108 (90 BASE) MCG/ACT inhaler   Inhalation   Inhale 2 puffs into the lungs every 6 (six) hours as needed for wheezing or shortness of breath.          . beclomethasone (QVAR) 40 MCG/ACT inhaler   Inhalation   Inhale 2 puffs into the lungs 2 (two) times daily.         . Calcium Carbonate-Vitamin D (CALCIUM + D PO)   Oral   Take 1 tablet by mouth daily.          Marland Kitchen CRANBERRY EXTRACT PO   Oral   Take 1 capsule by mouth daily.         . diphenhydrAMINE (BENADRYL) 25 mg capsule   Oral   Take 50 mg by mouth every 6 (six) hours as needed for itching or allergies.          Marland Kitchen docusate sodium (COLACE) 100 MG capsule   Oral   Take 100 mg by mouth 2 (two) times daily as needed for constipation.          Marland Kitchen esomeprazole (NEXIUM) 40 MG capsule   Oral   Take 40 mg by mouth 2 (two) times daily.         . fexofenadine (ALLEGRA) 180 MG tablet   Oral   Take 180 mg by mouth daily.         . fluvoxaMINE (LUVOX) 50 MG tablet   Oral   Take 50 mg by mouth at bedtime.         . gabapentin (NEURONTIN) 300 MG capsule   Oral   Take 600 mg by mouth 2 (two) times daily.         . hyoscyamine (LEVSIN SL) 0.125 MG SL tablet               . lamoTRIgine (LAMICTAL) 200 MG tablet   Oral   Take 200 mg by mouth at bedtime.         Marland Kitchen levocetirizine (XYZAL) 5 MG tablet   Oral   Take 5 mg by mouth every evening.         Marland Kitchen liothyronine (CYTOMEL) 25 MCG tablet   Oral   Take 25 mcg by mouth daily.         Marland Kitchen LORazepam (ATIVAN) 1 MG tablet   Oral   Take 1 mg by mouth every 8 (eight) hours as needed for anxiety.          . meclizine (ANTIVERT) 25 MG tablet   Oral   Take 25 mg by mouth 3 (three) times daily as needed  for dizziness.         Marland Kitchen  Melatonin 5 MG CAPS   Oral   Take 1 capsule by mouth at bedtime as needed (for sleep).         . methylphenidate (DAYTRANA) 10 mg/9hr patch   Transdermal   Place 1 patch onto the skin daily. wear patch for 9 hours only each day         . montelukast (SINGULAIR) 10 MG tablet   Oral   Take 10 mg by mouth at bedtime.         . Multiple Vitamin (MULTIVITAMIN WITH MINERALS) TABS   Oral   Take 1 tablet by mouth at bedtime.         . ondansetron (ZOFRAN) 4 MG tablet   Oral   Take 1 tablet (4 mg total) by mouth every 8 (eight) hours as needed.   30 tablet   0   . oxyCODONE-acetaminophen (PERCOCET/ROXICET) 5-325 MG per tablet   Oral   Take 2 tablets by mouth every 6 (six) hours as needed for pain.   40 tablet   0   . polyethylene glycol (MIRALAX / GLYCOLAX) packet   Oral   Take 17 g by mouth 2 (two) times daily as needed (for constipation).          . promethazine (PHENERGAN) 25 MG tablet   Oral   Take 25 mg by mouth every 6 (six) hours as needed for nausea.          . Red Yeast Rice Extract (RED YEAST RICE PO)   Oral   Take 1 capsule by mouth daily.         . Rizatriptan Benzoate (MAXALT PO)   Oral   Take 1 tablet by mouth every 2 (two) hours as needed. For migraines.         . senna (SENOKOT) 8.6 MG TABS   Oral   Take 1 tablet by mouth at bedtime.          . topiramate (TOPAMAX) 25 MG tablet   Oral   Take 50 mg by mouth at bedtime.          . traZODone (DESYREL) 50 MG tablet   Oral   Take 50 mg by mouth at bedtime.           Marland Kitchen levonorgestrel (MIRENA) 20 MCG/24HR IUD   Intrauterine   1 each by Intrauterine route once.          BP 107/51  Pulse 80  Temp(Src) 98.6 F (37 C) (Oral)  Resp 12  Ht 5\' 2"  (1.575 m)  Wt 112 lb (50.803 kg)  BMI 20.48 kg/m2  SpO2 94% Physical Exam  Nursing note and vitals reviewed. Constitutional: She is oriented to person, place, and time. She appears well-developed and  well-nourished.  Eyes: Pupils are equal, round, and reactive to light.  Cardiovascular: Normal rate and regular rhythm.   Pulmonary/Chest: Effort normal.  Abdominal: Soft. Bowel sounds are normal. She exhibits no distension. There is generalized tenderness. There is no rigidity and no guarding.  Musculoskeletal: Normal range of motion.  Neurological: She is alert and oriented to person, place, and time.  Skin: Skin is warm and dry.  Psychiatric: Her mood appears anxious. Her speech is rapid and/or pressured. She is agitated.    ED Course  Procedures (including critical care time) Labs Review Labs Reviewed  CBC WITH DIFFERENTIAL - Abnormal; Notable for the following:    HCT 35.3 (*)    All other components within normal limits  POCT  I-STAT, CHEM 8 - Abnormal; Notable for the following:    Potassium 3.3 (*)    Glucose, Bld 112 (*)    All other components within normal limits  TROPONIN I   Imaging Review No results found.  Date: 11/18/2012  Rate: 75  Rhythm: normal sinus rhythm  QRS Axis: normal  Intervals: normal  ST/T Wave abnormalities: nonspecific T wave changes  Conduction Disutrbances:none  Narrative Interpretation: normal  Old EKG Reviewed: none available   MDM   1. Abdominal pain, unspecified site   2. Nausea   3. Stress reaction     I think most of the patient's discomfort is related to anxiety.  She was given 1 mg of Ativan IV and is feeling much more comfortable, although she still endorses slight nausea.  I will give her.  Her normal energy and tablet to control.  This reassured her and refused labs and EKG, which were all within normal parameters, and this is very reassuring to the patient and her family.  She, states, that her pain is significantly less.    Arman Filter, NP 11/18/12 [phone]

## 2012-11-18 NOTE — ED Notes (Signed)
Patient ready to leave, patient feeling drowsy, family at bedside.

## 2012-11-19 NOTE — ED Provider Notes (Signed)
Medical screening examination/treatment/procedure(s) were performed by non-physician practitioner and as supervising physician I was immediately available for consultation/collaboration.   Hjalmar Ballengee, MD 11/19/12 0008 

## 2012-11-20 DIAGNOSIS — J309 Allergic rhinitis, unspecified: Secondary | ICD-10-CM | POA: Diagnosis not present

## 2012-11-22 DIAGNOSIS — J309 Allergic rhinitis, unspecified: Secondary | ICD-10-CM | POA: Diagnosis not present

## 2012-11-26 ENCOUNTER — Ambulatory Visit (INDEPENDENT_AMBULATORY_CARE_PROVIDER_SITE_OTHER): Payer: Medicare Other | Admitting: Gynecology

## 2012-11-26 ENCOUNTER — Encounter: Payer: Self-pay | Admitting: Gynecology

## 2012-11-26 VITALS — BP 112/70 | Ht 62.0 in | Wt 111.0 lb

## 2012-11-26 DIAGNOSIS — R87810 Cervical high risk human papillomavirus (HPV) DNA test positive: Secondary | ICD-10-CM | POA: Diagnosis not present

## 2012-11-26 DIAGNOSIS — Z30431 Encounter for routine checking of intrauterine contraceptive device: Secondary | ICD-10-CM

## 2012-11-26 DIAGNOSIS — Z9189 Other specified personal risk factors, not elsewhere classified: Secondary | ICD-10-CM | POA: Diagnosis not present

## 2012-11-26 DIAGNOSIS — E876 Hypokalemia: Secondary | ICD-10-CM

## 2012-11-26 DIAGNOSIS — Z1151 Encounter for screening for human papillomavirus (HPV): Secondary | ICD-10-CM | POA: Insufficient documentation

## 2012-11-26 DIAGNOSIS — Z124 Encounter for screening for malignant neoplasm of cervix: Secondary | ICD-10-CM | POA: Diagnosis not present

## 2012-11-26 DIAGNOSIS — N912 Amenorrhea, unspecified: Secondary | ICD-10-CM

## 2012-11-26 NOTE — Progress Notes (Signed)
Danielle Bruce 1960/09/03 [id number]        52 y.o.  [id number] for followup exam.  Several issues that below  Past medical history,surgical history, medications, allergies, family history and social history were all reviewed and documented in the EPIC chart.  ROS:  Performed and pertinent positives and negatives are included in the history, assessment and plan .  Exam: Kim assistant Filed Vitals:   11/26/12 1050  BP: 112/70  Height: 5\' 2"  (1.575 m)  Weight: 111 lb (50.349 kg)   General appearance  Normal Skin grossly normal Head/Neck normal with no cervical or supraclavicular adenopathy thyroid normal Lungs  clear Cardiac RR, without RMG Abdominal  soft, nontender, without masses, organomegaly or hernia Breasts  examined lying and sitting without masses, retractions, discharge or axillary adenopathy. Pelvic  Ext/BUS/vagina  normal  Cervix  normal Pap/HPV, IUD string visualized  Uterus  anteverted, normal size, shape and contour, midline and mobile nontender   Adnexa  Without masses or tenderness    Anus and perineum  normal   Rectovaginal  normal sphincter tone without palpated masses or tenderness.    Assessment/Plan:  52 y.o. [id number] female for annual exam, amenorrheic, Mirena IUD.   1. Mirena IUD 12/2009. Amenorrheic doing well. IUD string visualized. 2. Amenorrheic. Patient is very paranoid about being in menopause although she's not having hot flushes night sweats or other symptoms. We'll check baseline FSH for her reassurance. 3. Pap smear 08/2011 was normal but had positive high-risk HPV. Pap/HPV done today. If persistently abnormal then planned colposcopy. 4. Mammography overdoing she knows to schedule this. SBE monthly reviewed. 5. Colonoscopy 2013. 6. Health maintenance. No routine blood work done as it is all reportedly done through her primary physician's office. I did note to her on review of her lab results done at the hospital that her glucose has been in the  borderline range between 100-110. She also had a potassium of 3.3 he recently. I recommended that she followup with her primary now for both of these issues to be followed up on. She does report that her potassium has been low before at her primary physician's office I want her to followup with her for this. Assuming that her Pap smear is normal the she'll followup with me in one year.  Note: This document was prepared with digital dictation and possible smart phrase technology. Any transcriptional errors that result from this process are unintentional.   Dara Lords MD, 11:29 AM 11/26/2012

## 2012-11-26 NOTE — Addendum Note (Signed)
Addended by: Dayna Barker on: 11/26/2012 11:43 AM   Modules accepted: Orders

## 2012-11-26 NOTE — Patient Instructions (Signed)
Call to Schedule your mammogram  Facilities in [place]: 1)  [facility], [address] Phone: [phone] 2)  [facility]. [facility], [address] Phone: [phone] 3)  Dr. Bertrand at [facility]  [address] Phone: [phone]     Mammogram A mammogram is an X-ray test to find changes in a woman's breast. You should get a mammogram if:  You are 40 years of age or older  You have risk factors.   Your doctor recommends that you have one.  BEFORE THE TEST  Do not schedule the test the week before your period, especially if your breasts are sore during this time.  On the day of your mammogram:  Wash your breasts and armpits well. After washing, do not put on any deodorant or talcum powder on until after your test.   Eat and drink as you usually do.   Take your medicines as usual.   If you are diabetic and take insulin, make sure you:   Eat before coming for your test.   Take your insulin as usual.   If you cannot keep your appointment, call before the appointment to cancel. Schedule another appointment.  TEST  You will need to undress from the waist up. You will put on a hospital gown.   Your breast will be put on the mammogram machine, and it will press firmly on your breast with a piece of plastic called a compression paddle. This will make your breast flatter so that the machine can X-ray all parts of your breast.   Both breasts will be X-rayed. Each breast will be X-rayed from above and from the side. An X-ray might need to be taken again if the picture is not good enough.   The mammogram will last about 15 to 30 minutes.  AFTER THE TEST Finding out the results of your test Ask when your test results will be ready. Make sure you get your test results.  Document Released: 05/12/2008 Document Revised: 02/02/2011 Document Reviewed: 05/12/2008 [employer] Patient  Information 2012 [employer].   

## 2012-11-27 DIAGNOSIS — F3113 Bipolar disorder, current episode manic without psychotic features, severe: Secondary | ICD-10-CM | POA: Diagnosis not present

## 2012-11-27 DIAGNOSIS — J309 Allergic rhinitis, unspecified: Secondary | ICD-10-CM | POA: Diagnosis not present

## 2012-11-27 LAB — FOLLICLE STIMULATING HORMONE: FSH: 99.8 m[IU]/mL

## 2012-11-29 ENCOUNTER — Other Ambulatory Visit (HOSPITAL_COMMUNITY)
Admission: RE | Admit: 2012-11-29 | Discharge: 2012-11-29 | Disposition: A | Discharge status: Home / Self Care | Payer: Medicare Other | Source: Ambulatory Visit | Attending: Gynecology | Admitting: Gynecology

## 2012-12-05 DIAGNOSIS — J309 Allergic rhinitis, unspecified: Secondary | ICD-10-CM | POA: Diagnosis not present

## 2012-12-13 DIAGNOSIS — J309 Allergic rhinitis, unspecified: Secondary | ICD-10-CM | POA: Diagnosis not present

## 2012-12-16 DIAGNOSIS — J45909 Unspecified asthma, uncomplicated: Secondary | ICD-10-CM | POA: Diagnosis not present

## 2012-12-16 DIAGNOSIS — J3089 Other allergic rhinitis: Secondary | ICD-10-CM | POA: Diagnosis not present

## 2012-12-16 DIAGNOSIS — J301 Allergic rhinitis due to pollen: Secondary | ICD-10-CM | POA: Diagnosis not present

## 2012-12-16 DIAGNOSIS — H1045 Other chronic allergic conjunctivitis: Secondary | ICD-10-CM | POA: Diagnosis not present

## 2012-12-16 DIAGNOSIS — J309 Allergic rhinitis, unspecified: Secondary | ICD-10-CM | POA: Diagnosis not present

## 2012-12-19 DIAGNOSIS — F3113 Bipolar disorder, current episode manic without psychotic features, severe: Secondary | ICD-10-CM | POA: Diagnosis not present

## 2012-12-24 DIAGNOSIS — J309 Allergic rhinitis, unspecified: Secondary | ICD-10-CM | POA: Diagnosis not present

## 2012-12-24 DIAGNOSIS — Z23 Encounter for immunization: Secondary | ICD-10-CM | POA: Diagnosis not present

## 2012-12-27 DIAGNOSIS — J309 Allergic rhinitis, unspecified: Secondary | ICD-10-CM | POA: Diagnosis not present

## 2013-01-02 ENCOUNTER — Other Ambulatory Visit: Payer: Self-pay

## 2013-01-03 ENCOUNTER — Other Ambulatory Visit: Payer: Self-pay

## 2013-01-03 DIAGNOSIS — Z1231 Encounter for screening mammogram for malignant neoplasm of breast: Secondary | ICD-10-CM

## 2013-01-08 DIAGNOSIS — F332 Major depressive disorder, recurrent severe without psychotic features: Secondary | ICD-10-CM | POA: Diagnosis not present

## 2013-01-08 DIAGNOSIS — F909 Attention-deficit hyperactivity disorder, unspecified type: Secondary | ICD-10-CM | POA: Diagnosis not present

## 2013-01-08 DIAGNOSIS — F411 Generalized anxiety disorder: Secondary | ICD-10-CM | POA: Diagnosis not present

## 2013-01-08 DIAGNOSIS — F3113 Bipolar disorder, current episode manic without psychotic features, severe: Secondary | ICD-10-CM | POA: Diagnosis not present

## 2013-01-10 DIAGNOSIS — J309 Allergic rhinitis, unspecified: Secondary | ICD-10-CM | POA: Diagnosis not present

## 2013-01-17 DIAGNOSIS — J309 Allergic rhinitis, unspecified: Secondary | ICD-10-CM | POA: Diagnosis not present

## 2013-01-27 DIAGNOSIS — J309 Allergic rhinitis, unspecified: Secondary | ICD-10-CM | POA: Diagnosis not present

## 2013-01-29 DIAGNOSIS — F3113 Bipolar disorder, current episode manic without psychotic features, severe: Secondary | ICD-10-CM | POA: Diagnosis not present

## 2013-01-31 ENCOUNTER — Ambulatory Visit
Admission: RE | Admit: 2013-01-31 | Discharge: 2013-01-31 | Disposition: A | Discharge status: Home / Self Care | Payer: Medicare Other | Source: Ambulatory Visit

## 2013-01-31 DIAGNOSIS — Z1231 Encounter for screening mammogram for malignant neoplasm of breast: Secondary | ICD-10-CM | POA: Diagnosis not present

## 2013-02-05 DIAGNOSIS — H1045 Other chronic allergic conjunctivitis: Secondary | ICD-10-CM | POA: Diagnosis not present

## 2013-02-05 DIAGNOSIS — J45909 Unspecified asthma, uncomplicated: Secondary | ICD-10-CM | POA: Diagnosis not present

## 2013-02-05 DIAGNOSIS — J3089 Other allergic rhinitis: Secondary | ICD-10-CM | POA: Diagnosis not present

## 2013-02-05 DIAGNOSIS — J301 Allergic rhinitis due to pollen: Secondary | ICD-10-CM | POA: Diagnosis not present

## 2013-02-11 DIAGNOSIS — J309 Allergic rhinitis, unspecified: Secondary | ICD-10-CM | POA: Diagnosis not present

## 2013-02-28 DIAGNOSIS — J309 Allergic rhinitis, unspecified: Secondary | ICD-10-CM | POA: Diagnosis not present

## 2013-02-28 DIAGNOSIS — F3113 Bipolar disorder, current episode manic without psychotic features, severe: Secondary | ICD-10-CM | POA: Diagnosis not present

## 2013-03-05 DIAGNOSIS — R51 Headache: Secondary | ICD-10-CM | POA: Diagnosis not present

## 2013-03-07 DIAGNOSIS — J019 Acute sinusitis, unspecified: Secondary | ICD-10-CM | POA: Diagnosis not present

## 2013-03-07 DIAGNOSIS — F332 Major depressive disorder, recurrent severe without psychotic features: Secondary | ICD-10-CM | POA: Diagnosis not present

## 2013-03-07 DIAGNOSIS — D759 Disease of blood and blood-forming organs, unspecified: Secondary | ICD-10-CM | POA: Diagnosis not present

## 2013-03-11 DIAGNOSIS — J309 Allergic rhinitis, unspecified: Secondary | ICD-10-CM | POA: Diagnosis not present

## 2013-03-21 DIAGNOSIS — J309 Allergic rhinitis, unspecified: Secondary | ICD-10-CM | POA: Diagnosis not present

## 2013-03-27 DIAGNOSIS — J309 Allergic rhinitis, unspecified: Secondary | ICD-10-CM | POA: Diagnosis not present

## 2013-04-04 DIAGNOSIS — J309 Allergic rhinitis, unspecified: Secondary | ICD-10-CM | POA: Diagnosis not present

## 2013-04-09 DIAGNOSIS — Z85828 Personal history of other malignant neoplasm of skin: Secondary | ICD-10-CM | POA: Diagnosis not present

## 2013-04-09 DIAGNOSIS — L280 Lichen simplex chronicus: Secondary | ICD-10-CM | POA: Diagnosis not present

## 2013-04-09 DIAGNOSIS — L821 Other seborrheic keratosis: Secondary | ICD-10-CM | POA: Diagnosis not present

## 2013-04-09 DIAGNOSIS — L919 Hypertrophic disorder of the skin, unspecified: Secondary | ICD-10-CM | POA: Diagnosis not present

## 2013-04-09 DIAGNOSIS — D692 Other nonthrombocytopenic purpura: Secondary | ICD-10-CM | POA: Diagnosis not present

## 2013-04-09 DIAGNOSIS — D1801 Hemangioma of skin and subcutaneous tissue: Secondary | ICD-10-CM | POA: Diagnosis not present

## 2013-04-09 DIAGNOSIS — L909 Atrophic disorder of skin, unspecified: Secondary | ICD-10-CM | POA: Diagnosis not present

## 2013-04-09 DIAGNOSIS — D239 Other benign neoplasm of skin, unspecified: Secondary | ICD-10-CM | POA: Diagnosis not present

## 2013-04-09 DIAGNOSIS — L819 Disorder of pigmentation, unspecified: Secondary | ICD-10-CM | POA: Diagnosis not present

## 2013-04-14 DIAGNOSIS — F3113 Bipolar disorder, current episode manic without psychotic features, severe: Secondary | ICD-10-CM | POA: Diagnosis not present

## 2013-04-14 DIAGNOSIS — J309 Allergic rhinitis, unspecified: Secondary | ICD-10-CM | POA: Diagnosis not present

## 2013-04-23 DIAGNOSIS — J309 Allergic rhinitis, unspecified: Secondary | ICD-10-CM | POA: Diagnosis not present

## 2013-04-30 DIAGNOSIS — F3113 Bipolar disorder, current episode manic without psychotic features, severe: Secondary | ICD-10-CM | POA: Diagnosis not present

## 2013-05-03 ENCOUNTER — Encounter (HOSPITAL_COMMUNITY): Payer: Self-pay | Admitting: Emergency Medicine

## 2013-05-03 ENCOUNTER — Emergency Department (HOSPITAL_COMMUNITY)
Admission: EM | Admit: 2013-05-03 | Discharge: 2013-05-03 | Disposition: A | Discharge status: Home / Self Care | Payer: Medicare Other | Attending: Emergency Medicine | Admitting: Emergency Medicine

## 2013-05-03 DIAGNOSIS — M545 Low back pain, unspecified: Secondary | ICD-10-CM | POA: Diagnosis not present

## 2013-05-03 DIAGNOSIS — M79609 Pain in unspecified limb: Secondary | ICD-10-CM | POA: Diagnosis not present

## 2013-05-03 DIAGNOSIS — H9190 Unspecified hearing loss, unspecified ear: Secondary | ICD-10-CM | POA: Diagnosis not present

## 2013-05-03 DIAGNOSIS — F411 Generalized anxiety disorder: Secondary | ICD-10-CM | POA: Diagnosis not present

## 2013-05-03 DIAGNOSIS — G43909 Migraine, unspecified, not intractable, without status migrainosus: Secondary | ICD-10-CM | POA: Diagnosis not present

## 2013-05-03 DIAGNOSIS — M25559 Pain in unspecified hip: Secondary | ICD-10-CM | POA: Insufficient documentation

## 2013-05-03 DIAGNOSIS — F17200 Nicotine dependence, unspecified, uncomplicated: Secondary | ICD-10-CM | POA: Insufficient documentation

## 2013-05-03 DIAGNOSIS — F319 Bipolar disorder, unspecified: Secondary | ICD-10-CM | POA: Diagnosis not present

## 2013-05-03 DIAGNOSIS — Z8619 Personal history of other infectious and parasitic diseases: Secondary | ICD-10-CM | POA: Diagnosis not present

## 2013-05-03 DIAGNOSIS — K219 Gastro-esophageal reflux disease without esophagitis: Secondary | ICD-10-CM | POA: Insufficient documentation

## 2013-05-03 DIAGNOSIS — IMO0002 Reserved for concepts with insufficient information to code with codable children: Secondary | ICD-10-CM | POA: Diagnosis not present

## 2013-05-03 DIAGNOSIS — Z79899 Other long term (current) drug therapy: Secondary | ICD-10-CM | POA: Diagnosis not present

## 2013-05-03 DIAGNOSIS — M79606 Pain in leg, unspecified: Secondary | ICD-10-CM

## 2013-05-03 MED ORDER — METHYLPREDNISOLONE SODIUM SUCC 125 MG IJ SOLR: 125.0000 mg | Freq: Once | INTRAMUSCULAR | Status: DC

## 2013-05-03 MED ORDER — OXYCODONE-ACETAMINOPHEN 5-325 MG PO TABS: 1.0000 | ORAL_TABLET | ORAL | Status: DC | PRN

## 2013-05-03 MED ORDER — PREDNISONE 20 MG PO TABS: 40.0000 mg | ORAL_TABLET | Freq: Every day | ORAL | Status: DC

## 2013-05-03 MED ORDER — KETOROLAC TROMETHAMINE 60 MG/2ML IM SOLN
60.0000 mg | Freq: Once | INTRAMUSCULAR | Status: AC
  Administered 2013-05-03: 60 mg via INTRAMUSCULAR
  Filled 2013-05-03: qty 2

## 2013-05-03 MED ORDER — METHYLPREDNISOLONE SODIUM SUCC 125 MG IJ SOLR
125.0000 mg | Freq: Once | INTRAMUSCULAR | Status: AC
  Administered 2013-05-03: 125 mg via INTRAMUSCULAR
  Filled 2013-05-03: qty 2

## 2013-05-03 NOTE — ED Provider Notes (Signed)
Medical screening examination/treatment/procedure(s) were performed by non-physician practitioner and as supervising physician I was immediately available for consultation/collaboration.   EKG Interpretation None        Haleema Vanderheyden, MD 05/03/13 1442 

## 2013-05-03 NOTE — ED Provider Notes (Signed)
CSN: [id number]     Arrival date & time 05/03/13  1138 History   First MD Initiated Contact with Patient 05/03/13 1147     Chief Complaint  Patient presents with  . Hip Pain  . Leg Pain     (Consider location/radiation/quality/duration/timing/severity/associated sxs/prior Treatment) The history is provided by the patient and medical records.   This is a 53 year old female with past medical history significant for bipolar disorder, anxiety, depression, chronic migraines, presenting to the ED for left hip and leg pain for the past 3 days.   Denies recent injury, trauma, or falls. Patient states pain began to her left lower back, and left hip with radiation down the posterior and lateral thigh, but does not descend past the knee. She endorses an associated burning sensation. Denies numbness or paresthesias. No loss of bowel or bladder control. She has an ambulatory without difficulty.  Pt states she has had similar pain in her right leg before but never in the left leg.  Has been taking percocet and motrin with some improvement.  VS stable on arrival.  Past Medical History  Diagnosis Date  . GERD (gastroesophageal reflux disease)   . 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 Migraines   . Bipolar 2 disorder   . IBS (irritable bowel syndrome)   . Constipation   . ASCUS (atypical squamous cells of undetermined significance) on Pap smear 05/06/05    NEG HIGH RISK HPV--C&B BIOPSY BENIGN 12/2005  . Anxiety   . 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    cytology negative   Past Surgical History  Procedure Laterality Date  . Hemorrhoid surgery  1993    x3  . Cesarean section  [id number]  . Shoulder surgery  2007/2008  . Spine surgery  2010    cervical  . Dilation and curettage of uterus    . Pelvic laparoscopy    . Intrauterine device insertion  01/04/2010    MIRENA  . Laparoscopy N/A 10/16/2012    Procedure: LAPAROSCOPY DIAGNOSTIC;  Surgeon: Joyice Faster. Cornett, MD;  Location: [facility];  Service: General;   Laterality: N/A;  . Cholecystectomy N/A 10/16/2012    Procedure: LAPAROSCOPIC CHOLECYSTECTOMY;  Surgeon: Joyice Faster. Cornett, MD;  Location: [facility];  Service: General;  Laterality: N/A;  . Laparoscopic lysis of adhesions N/A 10/16/2012    Procedure: LAPAROSCOPIC LYSIS OF ADHESIONS;  Surgeon: Joyice Faster. Cornett, MD;  Location: [facility];  Service: General;  Laterality: N/A;   Family History  Problem Relation Age of Onset  . Diabetes Father   . Hypertension Father   . Hyperlipidemia Father   . Heart disease Maternal Grandfather   . Heart disease Paternal Grandmother   . Heart disease Paternal Grandfather    History  Substance Use Topics  . Smoking status: Current Some Day Smoker    Types: Cigarettes  . Smokeless tobacco: Never Used  . Alcohol Use: Yes     Comment: rare   OB History   Grav Para Term Preterm Abortions TAB SAB Ect Mult Living   4 3 3  1  1   3      Review of Systems  Musculoskeletal: Positive for arthralgias.  All other systems reviewed and are negative.      Allergies  Sulfa antibiotics; Flagyl; and Morphine and related  Home Medications   Current Outpatient Rx  Name  Route  Sig  Dispense  Refill  . albuterol (PROVENTIL HFA;VENTOLIN HFA) 108 (90 BASE) MCG/ACT inhaler   Inhalation  Inhale 2 puffs into the lungs every 6 (six) hours as needed for wheezing or shortness of breath.          . beclomethasone (QVAR) 40 MCG/ACT inhaler   Inhalation   Inhale 2 puffs into the lungs 2 (two) times daily.         . Calcium Carbonate-Vitamin D (CALCIUM + D PO)   Oral   Take 1 tablet by mouth daily.          Marland Kitchen CRANBERRY EXTRACT PO   Oral   Take 1 capsule by mouth daily.         . diphenhydrAMINE (BENADRYL) 25 mg capsule   Oral   Take 50 mg by mouth every 6 (six) hours as needed for itching or allergies.          Marland Kitchen docusate sodium (COLACE) 100 MG capsule   Oral   Take 100 mg by mouth 2 (two) times daily as needed for constipation.          Marland Kitchen  esomeprazole (NEXIUM) 40 MG capsule   Oral   Take 40 mg by mouth 2 (two) times daily.         . fexofenadine (ALLEGRA) 180 MG tablet   Oral   Take 180 mg by mouth daily.         . fluvoxaMINE (LUVOX) 50 MG tablet   Oral   Take 50 mg by mouth at bedtime.         . gabapentin (NEURONTIN) 300 MG capsule   Oral   Take 600 mg by mouth 2 (two) times daily.         . hyoscyamine (LEVSIN SL) 0.125 MG SL tablet               . lamoTRIgine (LAMICTAL) 200 MG tablet   Oral   Take 200 mg by mouth at bedtime.         Marland Kitchen levocetirizine (XYZAL) 5 MG tablet   Oral   Take 5 mg by mouth every evening.         Marland Kitchen levonorgestrel (MIRENA) 20 MCG/24HR IUD   Intrauterine   1 each by Intrauterine route once.         Marland Kitchen liothyronine (CYTOMEL) 25 MCG tablet   Oral   Take 25 mcg by mouth daily.         Marland Kitchen LORazepam (ATIVAN) 1 MG tablet   Oral   Take 1 mg by mouth every 8 (eight) hours as needed for anxiety.          . meclizine (ANTIVERT) 25 MG tablet   Oral   Take 25 mg by mouth 3 (three) times daily as needed for dizziness.         . Melatonin 5 MG CAPS   Oral   Take 1 capsule by mouth at bedtime as needed (for sleep).         . methylphenidate (DAYTRANA) 10 mg/9hr patch   Transdermal   Place 1 patch onto the skin daily. wear patch for 9 hours only each day         . montelukast (SINGULAIR) 10 MG tablet   Oral   Take 10 mg by mouth at bedtime.         . Multiple Vitamin (MULTIVITAMIN WITH MINERALS) TABS   Oral   Take 1 tablet by mouth at bedtime.         . ondansetron (ZOFRAN) 4 MG tablet   Oral  Take 1 tablet (4 mg total) by mouth every 8 (eight) hours as needed.   30 tablet   0   . oxyCODONE-acetaminophen (PERCOCET/ROXICET) 5-325 MG per tablet   Oral   Take 2 tablets by mouth every 6 (six) hours as needed for pain.   40 tablet   0   . polyethylene glycol (MIRALAX / GLYCOLAX) packet   Oral   Take 17 g by mouth 2 (two) times daily as needed  (for constipation).          . promethazine (PHENERGAN) 25 MG tablet   Oral   Take 25 mg by mouth every 6 (six) hours as needed for nausea.          . Red Yeast Rice Extract (RED YEAST RICE PO)   Oral   Take 1 capsule by mouth daily.         . Rizatriptan Benzoate (MAXALT PO)   Oral   Take 1 tablet by mouth every 2 (two) hours as needed. For migraines.         . senna (SENOKOT) 8.6 MG TABS   Oral   Take 1 tablet by mouth at bedtime.          . topiramate (TOPAMAX) 25 MG tablet   Oral   Take 50 mg by mouth at bedtime.          . traZODone (DESYREL) 50 MG tablet   Oral   Take 50 mg by mouth at bedtime.            BP 104/59  Pulse 77  Temp(Src) 98.6 F (37 C) (Oral)  Resp 16  SpO2 99%  Physical Exam  Nursing note and vitals reviewed. Constitutional: She is oriented to person, place, and time. She appears well-developed and well-nourished. No distress.  HENT:  Head: Normocephalic and atraumatic.  Mouth/Throat: Oropharynx is clear and moist.  Eyes: Conjunctivae and EOM are normal. Pupils are equal, round, and reactive to light.  Neck: Normal range of motion. Neck supple.  Cardiovascular: Normal rate, regular rhythm and normal heart sounds.   Pulmonary/Chest: Effort normal and breath sounds normal. No respiratory distress. She has no wheezes.  Musculoskeletal: Normal range of motion.       Lumbar back: She exhibits pain. She exhibits normal range of motion, no tenderness, no bony tenderness and no spasm.  Some pain of left lumbar back and left posterior hip without visible signs of trauma, swelling, or deformities; no midline tenderness, step-offs, or deformities; full ROM maintained without difficulty; ambulating unassisted without difficulty; distal sensation intact  Neurological: She is alert and oriented to person, place, and time.  Skin: Skin is warm and dry. She is not diaphoretic.  Psychiatric: She has a normal mood and affect.    ED Course   Procedures (including critical care time) Labs Review Labs Reviewed - No data to display Imaging Review No results found.   EKG Interpretation None      MDM   Final diagnoses:  Hip pain  Leg pain   Atraumatic left hip and leg pain.  No numbness or paresthesias.  No signs/sx concerning for cauda equina.  Pt ambulating without assistance.  Imaging deferred at this time.  Suspect lumbar radiculopathy.  Toradol and solu-medrol given.  Pt will be started on course of percocet and prednisone.  FU with PCP encouraged.  Discussed plan with pt, they agreed.  Return precautions advised.  Larene Pickett, PA-C 05/03/13 1439

## 2013-05-03 NOTE — Discharge Instructions (Signed)
Take the prescribed medication as directed.  Do not drive while taking percocet. Follow-up with your primary care physician if no improvement in the next few days. Return to the ED for new or worsening symptoms.

## 2013-05-03 NOTE — ED Notes (Signed)
Pt c/o lt hip pain shooting down her lt leg x 3 days.

## 2013-05-06 DIAGNOSIS — F3113 Bipolar disorder, current episode manic without psychotic features, severe: Secondary | ICD-10-CM | POA: Diagnosis not present

## 2013-05-07 DIAGNOSIS — J309 Allergic rhinitis, unspecified: Secondary | ICD-10-CM | POA: Diagnosis not present

## 2013-05-13 DIAGNOSIS — J309 Allergic rhinitis, unspecified: Secondary | ICD-10-CM | POA: Diagnosis not present

## 2013-05-19 DIAGNOSIS — F3113 Bipolar disorder, current episode manic without psychotic features, severe: Secondary | ICD-10-CM | POA: Diagnosis not present

## 2013-05-21 DIAGNOSIS — J309 Allergic rhinitis, unspecified: Secondary | ICD-10-CM | POA: Diagnosis not present

## 2013-05-28 DIAGNOSIS — M5430 Sciatica, unspecified side: Secondary | ICD-10-CM | POA: Diagnosis not present

## 2013-05-28 DIAGNOSIS — F3113 Bipolar disorder, current episode manic without psychotic features, severe: Secondary | ICD-10-CM | POA: Diagnosis not present

## 2013-05-28 DIAGNOSIS — F39 Unspecified mood [affective] disorder: Secondary | ICD-10-CM | POA: Diagnosis not present

## 2013-05-28 DIAGNOSIS — Z789 Other specified health status: Secondary | ICD-10-CM | POA: Diagnosis not present

## 2013-05-28 DIAGNOSIS — M542 Cervicalgia: Secondary | ICD-10-CM | POA: Diagnosis not present

## 2013-05-28 DIAGNOSIS — G43909 Migraine, unspecified, not intractable, without status migrainosus: Secondary | ICD-10-CM | POA: Diagnosis not present

## 2013-05-28 DIAGNOSIS — M545 Low back pain, unspecified: Secondary | ICD-10-CM | POA: Diagnosis not present

## 2013-05-28 DIAGNOSIS — J309 Allergic rhinitis, unspecified: Secondary | ICD-10-CM | POA: Diagnosis not present

## 2013-05-28 DIAGNOSIS — IMO0001 Reserved for inherently not codable concepts without codable children: Secondary | ICD-10-CM | POA: Diagnosis not present

## 2013-06-02 DIAGNOSIS — F3113 Bipolar disorder, current episode manic without psychotic features, severe: Secondary | ICD-10-CM | POA: Diagnosis not present

## 2013-06-04 ENCOUNTER — Ambulatory Visit: Payer: Medicare Other

## 2013-06-04 DIAGNOSIS — J309 Allergic rhinitis, unspecified: Secondary | ICD-10-CM | POA: Diagnosis not present

## 2013-06-09 ENCOUNTER — Ambulatory Visit: Payer: Medicare Other | Attending: Family Medicine

## 2013-06-09 DIAGNOSIS — M545 Low back pain, unspecified: Secondary | ICD-10-CM | POA: Diagnosis not present

## 2013-06-09 DIAGNOSIS — IMO0001 Reserved for inherently not codable concepts without codable children: Secondary | ICD-10-CM | POA: Insufficient documentation

## 2013-06-09 DIAGNOSIS — J309 Allergic rhinitis, unspecified: Secondary | ICD-10-CM | POA: Diagnosis not present

## 2013-06-09 DIAGNOSIS — M25559 Pain in unspecified hip: Secondary | ICD-10-CM | POA: Insufficient documentation

## 2013-06-09 DIAGNOSIS — R5381 Other malaise: Secondary | ICD-10-CM | POA: Diagnosis not present

## 2013-06-09 DIAGNOSIS — M542 Cervicalgia: Secondary | ICD-10-CM | POA: Insufficient documentation

## 2013-06-11 ENCOUNTER — Ambulatory Visit: Payer: Medicare Other

## 2013-06-11 DIAGNOSIS — IMO0001 Reserved for inherently not codable concepts without codable children: Secondary | ICD-10-CM | POA: Diagnosis not present

## 2013-06-11 DIAGNOSIS — R42 Dizziness and giddiness: Secondary | ICD-10-CM | POA: Diagnosis not present

## 2013-06-11 DIAGNOSIS — R110 Nausea: Secondary | ICD-10-CM | POA: Diagnosis not present

## 2013-06-13 DIAGNOSIS — J309 Allergic rhinitis, unspecified: Secondary | ICD-10-CM | POA: Diagnosis not present

## 2013-06-16 ENCOUNTER — Ambulatory Visit: Payer: Medicare Other | Admitting: Physical Therapy

## 2013-06-16 DIAGNOSIS — J309 Allergic rhinitis, unspecified: Secondary | ICD-10-CM | POA: Diagnosis not present

## 2013-06-16 DIAGNOSIS — IMO0001 Reserved for inherently not codable concepts without codable children: Secondary | ICD-10-CM | POA: Diagnosis not present

## 2013-06-16 DIAGNOSIS — F3113 Bipolar disorder, current episode manic without psychotic features, severe: Secondary | ICD-10-CM | POA: Diagnosis not present

## 2013-06-18 ENCOUNTER — Ambulatory Visit: Payer: Medicare Other | Admitting: Physical Therapy

## 2013-06-18 DIAGNOSIS — IMO0001 Reserved for inherently not codable concepts without codable children: Secondary | ICD-10-CM | POA: Diagnosis not present

## 2013-06-19 DIAGNOSIS — R198 Other specified symptoms and signs involving the digestive system and abdomen: Secondary | ICD-10-CM | POA: Diagnosis not present

## 2013-06-19 DIAGNOSIS — T8189XA Other complications of procedures, not elsewhere classified, initial encounter: Secondary | ICD-10-CM | POA: Diagnosis not present

## 2013-06-20 DIAGNOSIS — J309 Allergic rhinitis, unspecified: Secondary | ICD-10-CM | POA: Diagnosis not present

## 2013-06-23 ENCOUNTER — Ambulatory Visit: Payer: Medicare Other | Admitting: Physical Therapy

## 2013-06-23 DIAGNOSIS — J309 Allergic rhinitis, unspecified: Secondary | ICD-10-CM | POA: Diagnosis not present

## 2013-06-23 DIAGNOSIS — IMO0001 Reserved for inherently not codable concepts without codable children: Secondary | ICD-10-CM | POA: Diagnosis not present

## 2013-06-25 ENCOUNTER — Ambulatory Visit: Payer: Medicare Other | Admitting: Physical Therapy

## 2013-06-25 DIAGNOSIS — IMO0001 Reserved for inherently not codable concepts without codable children: Secondary | ICD-10-CM | POA: Diagnosis not present

## 2013-06-27 DIAGNOSIS — F3113 Bipolar disorder, current episode manic without psychotic features, severe: Secondary | ICD-10-CM | POA: Diagnosis not present

## 2013-06-30 ENCOUNTER — Ambulatory Visit: Payer: Medicare Other | Attending: Family Medicine | Admitting: Physical Therapy

## 2013-06-30 DIAGNOSIS — R5381 Other malaise: Secondary | ICD-10-CM | POA: Insufficient documentation

## 2013-06-30 DIAGNOSIS — M25559 Pain in unspecified hip: Secondary | ICD-10-CM | POA: Insufficient documentation

## 2013-06-30 DIAGNOSIS — IMO0001 Reserved for inherently not codable concepts without codable children: Secondary | ICD-10-CM | POA: Insufficient documentation

## 2013-06-30 DIAGNOSIS — M545 Low back pain, unspecified: Secondary | ICD-10-CM | POA: Insufficient documentation

## 2013-06-30 DIAGNOSIS — J309 Allergic rhinitis, unspecified: Secondary | ICD-10-CM | POA: Diagnosis not present

## 2013-06-30 DIAGNOSIS — M542 Cervicalgia: Secondary | ICD-10-CM | POA: Insufficient documentation

## 2013-07-01 DIAGNOSIS — F3113 Bipolar disorder, current episode manic without psychotic features, severe: Secondary | ICD-10-CM | POA: Diagnosis not present

## 2013-07-02 ENCOUNTER — Ambulatory Visit: Payer: Medicare Other | Admitting: Physical Therapy

## 2013-07-02 DIAGNOSIS — IMO0001 Reserved for inherently not codable concepts without codable children: Secondary | ICD-10-CM | POA: Diagnosis not present

## 2013-07-07 ENCOUNTER — Ambulatory Visit: Payer: Medicare Other | Admitting: Physical Therapy

## 2013-07-07 DIAGNOSIS — IMO0001 Reserved for inherently not codable concepts without codable children: Secondary | ICD-10-CM | POA: Diagnosis not present

## 2013-07-07 DIAGNOSIS — J309 Allergic rhinitis, unspecified: Secondary | ICD-10-CM | POA: Diagnosis not present

## 2013-07-10 ENCOUNTER — Ambulatory Visit: Payer: Medicare Other

## 2013-07-10 DIAGNOSIS — IMO0001 Reserved for inherently not codable concepts without codable children: Secondary | ICD-10-CM | POA: Diagnosis not present

## 2013-07-10 DIAGNOSIS — R233 Spontaneous ecchymoses: Secondary | ICD-10-CM | POA: Diagnosis not present

## 2013-07-15 ENCOUNTER — Ambulatory Visit: Payer: Medicare Other | Admitting: Physical Therapy

## 2013-07-15 DIAGNOSIS — J309 Allergic rhinitis, unspecified: Secondary | ICD-10-CM | POA: Diagnosis not present

## 2013-07-15 DIAGNOSIS — E0590 Thyrotoxicosis, unspecified without thyrotoxic crisis or storm: Secondary | ICD-10-CM | POA: Diagnosis not present

## 2013-07-15 DIAGNOSIS — IMO0001 Reserved for inherently not codable concepts without codable children: Secondary | ICD-10-CM | POA: Diagnosis not present

## 2013-07-16 DIAGNOSIS — F3113 Bipolar disorder, current episode manic without psychotic features, severe: Secondary | ICD-10-CM | POA: Diagnosis not present

## 2013-07-17 ENCOUNTER — Encounter: Payer: Self-pay | Admitting: Dietician

## 2013-07-17 ENCOUNTER — Encounter: Payer: Medicare Other | Attending: Family Medicine | Admitting: Dietician

## 2013-07-17 VITALS — Ht 62.0 in | Wt 116.0 lb

## 2013-07-17 DIAGNOSIS — F329 Major depressive disorder, single episode, unspecified: Secondary | ICD-10-CM | POA: Insufficient documentation

## 2013-07-17 DIAGNOSIS — G43909 Migraine, unspecified, not intractable, without status migrainosus: Secondary | ICD-10-CM | POA: Insufficient documentation

## 2013-07-17 DIAGNOSIS — F3289 Other specified depressive episodes: Secondary | ICD-10-CM | POA: Insufficient documentation

## 2013-07-17 DIAGNOSIS — R638 Other symptoms and signs concerning food and fluid intake: Secondary | ICD-10-CM

## 2013-07-17 DIAGNOSIS — G7241 Inclusion body myositis [IBM]: Secondary | ICD-10-CM | POA: Diagnosis not present

## 2013-07-17 DIAGNOSIS — E739 Lactose intolerance, unspecified: Secondary | ICD-10-CM | POA: Insufficient documentation

## 2013-07-17 DIAGNOSIS — K219 Gastro-esophageal reflux disease without esophagitis: Secondary | ICD-10-CM | POA: Insufficient documentation

## 2013-07-17 DIAGNOSIS — Z713 Dietary counseling and surveillance: Secondary | ICD-10-CM | POA: Diagnosis not present

## 2013-07-17 NOTE — Progress Notes (Signed)
  Medical Nutrition Therapy:  Appt start time: 1100 end time:  1200.  Assessment:  Primary concerns today: Danielle Bruce is here today since she doesn't think that she gets enough protein. Has a hx of depression and GI issues. Feels like available foods to eat is "getting smaller and smaller". Normal body weight is 108-110 lbs and gained up to 122 lbs. Had a lot of depression and migraines this winter and had a very difficult 3 months. Was taking steroids for migraines which caused weight gain.  States that she is "binging" on fruit. GI doctor wants her to eat 5-6 x day small portion and she is eating more than she should. Has reflux, IBS, low motility, and hemorrhoids. Takes Nexium, Miralax, and some days will Senakot or Colace. Once per month will do a "colon cleanse". Stopped eating red meat 15-20 years ago d/t low motility. Has lactose intolerance and has some bloating when eating gluten. Stopped eating sugar because it's "poison" not d/t health concerns. Gets nausea with fish. Avoids a lot of processed foods. Chicken is the only meat she eats. Does not have a gall bladder so will feel sick if does not frequently, but struggles to do that.     Feels tired all the time d/t her depression and is on disability so she is not currently working. Lives with her parents. Has 3 grown kids who are grown.   Preferred Learning Style:   No preference indicated   Learning Readiness:   Ready  MEDICATIONS: see list   DIETARY INTAKE:  Usual eating pattern includes 2 meals   Avoided foods include red meat, dairy, fish, sugar   24-hr recall:  B ( AM): nothing (not hungry) Snk ( AM): none  L ( PM): fruit or chicken and broccoli ([employer]) or tortilla chips and grilled chicken with lettuce and tomatoes from [employer] at home will have fruit, humus, cucumber, tomatoes, and fruit on GF bread or crackers or egg muffins with veggie sausage patties Snk ( PM): none D ( PM): rice or GF noodles with tomatoes and chicken  and vegetables Snk ( PM): none Beverages: water, decaf coffee, unsweet tea rarely  Usual physical activity: in physical therapy for hip and neck and planning to go back to the gym  Estimated energy needs: 1600 calories 180 g carbohydrates 120 g protein 44 g fat  Progress Towards Goal(s):  In progress.   Nutritional Diagnosis:  NI-5.7.1 Inadequate protein intake As related to limited diet d/t chronic health conditions.  As evidenced by patient report .    Intervention:  Nutrition counseling. Plan: Consider drinking a glass of soy milk each day for protein. Try Baked Tofu (found in produce section). Try textured vegetable protein (found by flours in "healthy" stores). Use quinoa for protein instead of gluten free noodles. Try edimame or soy beans. For beans, try making chick pea salad (like tuna salad). Try hemp or pea protein to make protein shake. To cook tempeh - steam it first and then cook like ground meat  Teaching Method Utilized:  Visual Auditory Hands on  Handouts given during visit include:  Vegetarian Protein Intake  Barriers to learning/adherence to lifestyle change: Limited diet and chronic health conditions  Demonstrated degree of understanding via:  Teach Back   Monitoring/Evaluation:  Dietary intake, exercise, and body weight in 2 month(s).

## 2013-07-17 NOTE — Patient Instructions (Signed)
Consider drinking a glass of soy milk each day for protein. Try Baked Tofu (found in produce section). Try textured vegetable protein (found by flours in "healthy" stores). Use quinoa for protein instead of gluten free noodles. Try edimame or soy beans. For beans, try making chick pea salad (like tuna salad). Try hemp or pea protein to make protein shake. To cook tempeh - steam it first and then cook like ground meat

## 2013-07-18 ENCOUNTER — Ambulatory Visit: Payer: Medicare Other | Admitting: Physical Therapy

## 2013-07-18 DIAGNOSIS — IMO0001 Reserved for inherently not codable concepts without codable children: Secondary | ICD-10-CM | POA: Diagnosis not present

## 2013-07-22 ENCOUNTER — Ambulatory Visit: Payer: Medicare Other | Admitting: Physical Therapy

## 2013-07-22 DIAGNOSIS — IMO0001 Reserved for inherently not codable concepts without codable children: Secondary | ICD-10-CM | POA: Diagnosis not present

## 2013-07-22 DIAGNOSIS — J309 Allergic rhinitis, unspecified: Secondary | ICD-10-CM | POA: Diagnosis not present

## 2013-07-25 ENCOUNTER — Ambulatory Visit: Payer: Medicare Other

## 2013-07-25 DIAGNOSIS — J45909 Unspecified asthma, uncomplicated: Secondary | ICD-10-CM | POA: Diagnosis not present

## 2013-07-25 DIAGNOSIS — J3089 Other allergic rhinitis: Secondary | ICD-10-CM | POA: Diagnosis not present

## 2013-07-25 DIAGNOSIS — H1045 Other chronic allergic conjunctivitis: Secondary | ICD-10-CM | POA: Diagnosis not present

## 2013-07-25 DIAGNOSIS — J301 Allergic rhinitis due to pollen: Secondary | ICD-10-CM | POA: Diagnosis not present

## 2013-07-25 DIAGNOSIS — IMO0001 Reserved for inherently not codable concepts without codable children: Secondary | ICD-10-CM | POA: Diagnosis not present

## 2013-07-28 ENCOUNTER — Ambulatory Visit: Payer: Medicare Other | Attending: Family Medicine | Admitting: Physical Therapy

## 2013-07-28 DIAGNOSIS — M545 Low back pain, unspecified: Secondary | ICD-10-CM | POA: Diagnosis not present

## 2013-07-28 DIAGNOSIS — IMO0001 Reserved for inherently not codable concepts without codable children: Secondary | ICD-10-CM | POA: Diagnosis not present

## 2013-07-28 DIAGNOSIS — M542 Cervicalgia: Secondary | ICD-10-CM | POA: Insufficient documentation

## 2013-07-28 DIAGNOSIS — M25559 Pain in unspecified hip: Secondary | ICD-10-CM | POA: Insufficient documentation

## 2013-07-28 DIAGNOSIS — J309 Allergic rhinitis, unspecified: Secondary | ICD-10-CM | POA: Diagnosis not present

## 2013-07-28 DIAGNOSIS — R5381 Other malaise: Secondary | ICD-10-CM | POA: Insufficient documentation

## 2013-07-30 ENCOUNTER — Ambulatory Visit: Payer: Medicare Other

## 2013-07-31 DIAGNOSIS — J309 Allergic rhinitis, unspecified: Secondary | ICD-10-CM | POA: Diagnosis not present

## 2013-08-01 DIAGNOSIS — D692 Other nonthrombocytopenic purpura: Secondary | ICD-10-CM | POA: Diagnosis not present

## 2013-08-01 DIAGNOSIS — Z85828 Personal history of other malignant neoplasm of skin: Secondary | ICD-10-CM | POA: Diagnosis not present

## 2013-08-01 DIAGNOSIS — L821 Other seborrheic keratosis: Secondary | ICD-10-CM | POA: Diagnosis not present

## 2013-08-04 ENCOUNTER — Ambulatory Visit: Payer: Medicare Other

## 2013-08-08 DIAGNOSIS — F3113 Bipolar disorder, current episode manic without psychotic features, severe: Secondary | ICD-10-CM | POA: Diagnosis not present

## 2013-08-08 DIAGNOSIS — J309 Allergic rhinitis, unspecified: Secondary | ICD-10-CM | POA: Diagnosis not present

## 2013-08-12 DIAGNOSIS — F3113 Bipolar disorder, current episode manic without psychotic features, severe: Secondary | ICD-10-CM | POA: Diagnosis not present

## 2013-08-14 DIAGNOSIS — H6120 Impacted cerumen, unspecified ear: Secondary | ICD-10-CM | POA: Diagnosis not present

## 2013-08-14 DIAGNOSIS — M1990 Unspecified osteoarthritis, unspecified site: Secondary | ICD-10-CM | POA: Diagnosis not present

## 2013-08-14 DIAGNOSIS — M25549 Pain in joints of unspecified hand: Secondary | ICD-10-CM | POA: Diagnosis not present

## 2013-08-14 DIAGNOSIS — M79609 Pain in unspecified limb: Secondary | ICD-10-CM | POA: Diagnosis not present

## 2013-08-14 DIAGNOSIS — M6530 Trigger finger, unspecified finger: Secondary | ICD-10-CM | POA: Diagnosis not present

## 2013-08-15 DIAGNOSIS — J309 Allergic rhinitis, unspecified: Secondary | ICD-10-CM | POA: Diagnosis not present

## 2013-08-19 DIAGNOSIS — J309 Allergic rhinitis, unspecified: Secondary | ICD-10-CM | POA: Diagnosis not present

## 2013-08-25 DIAGNOSIS — M6530 Trigger finger, unspecified finger: Secondary | ICD-10-CM | POA: Diagnosis not present

## 2013-08-29 ENCOUNTER — Encounter (HOSPITAL_BASED_OUTPATIENT_CLINIC_OR_DEPARTMENT_OTHER): Payer: Self-pay | Admitting: Emergency Medicine

## 2013-08-29 ENCOUNTER — Emergency Department (HOSPITAL_BASED_OUTPATIENT_CLINIC_OR_DEPARTMENT_OTHER)
Admission: EM | Admit: 2013-08-29 | Discharge: 2013-08-29 | Disposition: A | Discharge status: Home / Self Care | Payer: Medicare Other | Attending: Emergency Medicine | Admitting: Emergency Medicine

## 2013-08-29 DIAGNOSIS — K5900 Constipation, unspecified: Secondary | ICD-10-CM | POA: Insufficient documentation

## 2013-08-29 DIAGNOSIS — Z9889 Other specified postprocedural states: Secondary | ICD-10-CM | POA: Diagnosis not present

## 2013-08-29 DIAGNOSIS — F17200 Nicotine dependence, unspecified, uncomplicated: Secondary | ICD-10-CM | POA: Diagnosis not present

## 2013-08-29 DIAGNOSIS — IMO0001 Reserved for inherently not codable concepts without codable children: Secondary | ICD-10-CM | POA: Insufficient documentation

## 2013-08-29 DIAGNOSIS — IMO0002 Reserved for concepts with insufficient information to code with codable children: Secondary | ICD-10-CM | POA: Insufficient documentation

## 2013-08-29 DIAGNOSIS — Z8619 Personal history of other infectious and parasitic diseases: Secondary | ICD-10-CM | POA: Insufficient documentation

## 2013-08-29 DIAGNOSIS — M542 Cervicalgia: Secondary | ICD-10-CM | POA: Insufficient documentation

## 2013-08-29 DIAGNOSIS — G43909 Migraine, unspecified, not intractable, without status migrainosus: Secondary | ICD-10-CM | POA: Insufficient documentation

## 2013-08-29 DIAGNOSIS — F3189 Other bipolar disorder: Secondary | ICD-10-CM | POA: Diagnosis not present

## 2013-08-29 DIAGNOSIS — K589 Irritable bowel syndrome without diarrhea: Secondary | ICD-10-CM | POA: Insufficient documentation

## 2013-08-29 DIAGNOSIS — Z79899 Other long term (current) drug therapy: Secondary | ICD-10-CM | POA: Insufficient documentation

## 2013-08-29 DIAGNOSIS — J309 Allergic rhinitis, unspecified: Secondary | ICD-10-CM | POA: Insufficient documentation

## 2013-08-29 DIAGNOSIS — K219 Gastro-esophageal reflux disease without esophagitis: Secondary | ICD-10-CM | POA: Diagnosis not present

## 2013-08-29 DIAGNOSIS — M791 Myalgia, unspecified site: Secondary | ICD-10-CM

## 2013-08-29 DIAGNOSIS — F411 Generalized anxiety disorder: Secondary | ICD-10-CM | POA: Diagnosis not present

## 2013-08-29 LAB — URINALYSIS, ROUTINE W REFLEX MICROSCOPIC
Bilirubin Urine: NEGATIVE
Glucose, UA: NEGATIVE mg/dL
Hgb urine dipstick: NEGATIVE
Ketones, ur: NEGATIVE mg/dL
Nitrite: NEGATIVE
Protein, ur: NEGATIVE mg/dL
Specific Gravity, Urine: 1.004 — ABNORMAL LOW (ref 1.005–1.030)
Urobilinogen, UA: 0.2 mg/dL (ref 0.0–1.0)
pH: 8 (ref 5.0–8.0)

## 2013-08-29 LAB — URINE MICROSCOPIC-ADD ON

## 2013-08-29 MED ORDER — HYDROMORPHONE HCL PF 2 MG/ML IJ SOLN
2.0000 mg | Freq: Once | INTRAMUSCULAR | Status: AC
  Administered 2013-08-29: 2 mg via INTRAMUSCULAR
  Filled 2013-08-29: qty 1

## 2013-08-29 MED ORDER — PROMETHAZINE HCL 25 MG/ML IJ SOLN
25.0000 mg | Freq: Once | INTRAMUSCULAR | Status: AC
  Administered 2013-08-29: 25 mg via INTRAMUSCULAR

## 2013-08-29 MED ORDER — PROMETHAZINE HCL 25 MG/ML IJ SOLN
INTRAMUSCULAR | Status: AC
  Filled 2013-08-29: qty 1

## 2013-08-29 MED ORDER — DIPHENHYDRAMINE HCL 25 MG PO CAPS
25.0000 mg | ORAL_CAPSULE | Freq: Once | ORAL | Status: DC
  Filled 2013-08-29: qty 1

## 2013-08-29 NOTE — ED Notes (Signed)
Pain in her arms and legs gradually over the past few months. Today pain has been at its worse. Her MD cannot find a cause.

## 2013-08-29 NOTE — Discharge Instructions (Signed)
Recommend close followup with your primary care Dr. Discussed with her about workup for connective tissue disorder and/or MS. Return for any newer worse symptoms. Take Percocet as needed for pain in the meantime.

## 2013-08-29 NOTE — ED Provider Notes (Signed)
CSN: [id number]     Arrival date & time 08/29/13  1754 History  This chart was scribed for Fredia Sorrow, MD by Randa Evens, ED Scribe. This patient was seen in room MH04/MH04 and the patient's care was started at 8:49 PM.     Chief Complaint  Patient presents with  . Muscle Pain   Patient is a 53 y.o. female presenting with musculoskeletal pain. The history is provided by the patient. No language interpreter was used.  Muscle Pain Associated symptoms include abdominal pain and headaches. Pertinent negatives include no chest pain and no shortness of breath.   HPI Comments: Danielle Bruce is a 53 y.o. female who presents to the Emergency Department complaining of progressively worsening hand pain, knee pain, and burning leg pain onset 1 year prior. She states she has abdominal pain, nausea, neck pain, headaches, and environmental allergies. She states that her pain worsens with movement. She states that her fingers feel colder than normal. She states she has a history of spine fusion. States she recently saw her PCP 1 month prior where they ruled out rheumatoid arthritis. Pt takes 800mg  of ibuprofen twice per day with no relief. Pt states she has percocet also for her chronic migraines. She denies fever, chills, visual disturbance, cough, rhinorrhea, sore throat, chest pain, SOB, diarrhea, vomiting, dysuria, leg edema, rash, back pain, history of bleeding easily.     PCP Dr. Mayra Neer  Past Medical History  Diagnosis Date  . GERD (gastroesophageal reflux disease)   . 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 Migraines   . Bipolar 2 disorder   . IBS (irritable bowel syndrome)   . Constipation   . ASCUS (atypical squamous cells of undetermined significance) on Pap smear 05/06/05    NEG HIGH RISK HPV--C&B BIOPSY BENIGN 12/2005  . Anxiety   . 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    cytology negative   Past Surgical History  Procedure Laterality Date  . Hemorrhoid surgery  1993    x3  . Cesarean  section  [id number]  . Shoulder surgery  2007/2008  . Spine surgery  2010    cervical  . Dilation and curettage of uterus    . Pelvic laparoscopy    . Intrauterine device insertion  01/04/2010    MIRENA  . Laparoscopy N/A 10/16/2012    Procedure: LAPAROSCOPY DIAGNOSTIC;  Surgeon: Joyice Faster. Cornett, MD;  Location: [facility];  Service: General;  Laterality: N/A;  . Cholecystectomy N/A 10/16/2012    Procedure: LAPAROSCOPIC CHOLECYSTECTOMY;  Surgeon: Joyice Faster. Cornett, MD;  Location: [facility];  Service: General;  Laterality: N/A;  . Laparoscopic lysis of adhesions N/A 10/16/2012    Procedure: LAPAROSCOPIC LYSIS OF ADHESIONS;  Surgeon: Joyice Faster. Cornett, MD;  Location: [facility];  Service: General;  Laterality: N/A;   Family History  Problem Relation Age of Onset  . Diabetes Father   . Hypertension Father   . Hyperlipidemia Father   . Heart disease Maternal Grandfather   . Heart disease Paternal Grandmother   . Heart disease Paternal Grandfather    History  Substance Use Topics  . Smoking status: Current Some Day Smoker    Types: Cigarettes  . Smokeless tobacco: Never Used  . Alcohol Use: Yes     Comment: rare   OB History   Grav Para Term Preterm Abortions TAB SAB Ect Mult Living   4 3 3  1  1   3      Review of Systems  Constitutional:  Negative for fever and chills.  HENT: Negative for rhinorrhea and sore throat.   Eyes: Negative for visual disturbance.  Respiratory: Negative for cough and shortness of breath.   Cardiovascular: Negative for chest pain.  Gastrointestinal: Positive for nausea and abdominal pain. Negative for vomiting and diarrhea.  Genitourinary: Negative for dysuria.  Musculoskeletal: Positive for arthralgias, myalgias and neck pain. Negative for back pain.  Skin: Negative for rash.  Allergic/Immunologic: Positive for environmental allergies.  Neurological: Positive for headaches.  Psychiatric/Behavioral: Negative for confusion.    Allergies  Sulfa antibiotics;  Flagyl; Lactose intolerance (gi); and Morphine and related  Home Medications   Prior to Admission medications   Medication Sig Start Date End Date Taking? Authorizing Provider  albuterol (PROVENTIL HFA;VENTOLIN HFA) 108 (90 BASE) MCG/ACT inhaler Inhale 2 puffs into the lungs every 6 (six) hours as needed for wheezing or shortness of breath.     Historical Provider, MD  beclomethasone (QVAR) 40 MCG/ACT inhaler Inhale 2 puffs into the lungs 2 (two) times daily.    Historical Provider, MD  BEPREVE 1.5 % SOLN Place 1 drop into both eyes daily.  04/03/13   Historical Provider, MD  calcium-vitamin D (OSCAL WITH D) 500-200 MG-UNIT per tablet Take 2 tablets by mouth daily with breakfast.    Historical Provider, MD  diphenhydrAMINE (BENADRYL) 25 mg capsule Take 50 mg by mouth every 6 (six) hours as needed for itching or allergies.     Historical Provider, MD  docusate sodium (COLACE) 100 MG capsule Take 100 mg by mouth 2 (two) times daily as needed for constipation.     Historical Provider, MD  esomeprazole (NEXIUM) 40 MG capsule Take 40 mg by mouth 2 (two) times daily.    Historical Provider, MD  fexofenadine (ALLEGRA) 180 MG tablet Take 180 mg by mouth daily.    Historical Provider, MD  fluvoxaMINE (LUVOX) 50 MG tablet Take 50 mg by mouth every morning.     Historical Provider, MD  gabapentin (NEURONTIN) 300 MG capsule Take 300-600 mg by mouth 2 (two) times daily.     Historical Provider, MD  hyoscyamine (LEVSIN SL) 0.125 MG SL tablet Take 0.125 mg by mouth every 6 (six) hours as needed for cramping.  10/18/12   Historical Provider, MD  lamoTRIgine (LAMICTAL) 200 MG tablet Take 200 mg by mouth at bedtime.    Historical Provider, MD  levocetirizine (XYZAL) 5 MG tablet Take 5 mg by mouth every evening.    Historical Provider, MD  levonorgestrel (MIRENA) 20 MCG/24HR IUD 1 each by Intrauterine route once.    Historical Provider, MD  liothyronine (CYTOMEL) 25 MCG tablet Take 25 mcg by mouth daily.    Historical  Provider, MD  LORazepam (ATIVAN) 1 MG tablet Take 1 mg by mouth every 8 (eight) hours as needed for anxiety.     Historical Provider, MD  meclizine (ANTIVERT) 25 MG tablet Take 25 mg by mouth 3 (three) times daily as needed for dizziness.    Historical Provider, MD  Melatonin 5 MG CAPS Take 1 capsule by mouth at bedtime as needed (for sleep).    Historical Provider, MD  methylphenidate [facility]) 10 mg/9hr patch Place 1 patch onto the skin daily. wear patch for 9 hours only each day    Historical Provider, MD  montelukast (SINGULAIR) 10 MG tablet Take 10 mg by mouth at bedtime.    Historical Provider, MD  Multiple Vitamin (MULTIVITAMIN WITH MINERALS) TABS Take 1 tablet by mouth at bedtime.    Historical  Provider, MD  oxyCODONE-acetaminophen (PERCOCET/ROXICET) 5-325 MG per tablet Take 2 tablets by mouth every 6 (six) hours as needed for pain. 10/18/12   Megan Dort, PA-C  oxyCODONE-acetaminophen (PERCOCET/ROXICET) 5-325 MG per tablet Take 1 tablet by mouth every 4 (four) hours as needed. 05/03/13   Larene Pickett, PA-C  polyethylene glycol [facility] Flor[facility] Raveling) packet Take 17 g by mouth 2 (two) times daily as needed (for constipation).     Historical Provider, MD  predniSONE (DELTASONE) 20 MG tablet Take 2 tablets (40 mg total) by mouth daily. Take 40 mg by mouth daily for 3 days, then 20mg  by mouth daily for 3 days, then 10mg  daily for 3 days 05/03/13   Larene Pickett, PA-C  promethazine (PHENERGAN) 25 MG tablet Take 25 mg by mouth every 6 (six) hours as needed for nausea.     Historical Provider, MD  senna (SENOKOT) 8.6 MG TABS Take 1 tablet by mouth at bedtime.     Historical Provider, MD  topiramate (TOPAMAX) 50 MG tablet Take 25-50 mg by mouth 2 (two) times daily. Take half a tablet (25mg ) in the morning and 1 tablet (50 mg) in the evening    Historical Provider, MD  traZODone (DESYREL) 50 MG tablet Take 50 mg by mouth at bedtime.      Historical Provider, MD   Triage Vitals: BP 129/74  Pulse 90   Temp(Src) 98 F (36.7 C) (Oral)  Resp 16  Ht 5\' 2"  (1.575 m)  Wt 116 lb (52.617 kg)  BMI 21.21 kg/m2  SpO2 100%  Physical Exam  Nursing note and vitals reviewed. Constitutional: She is oriented to person, place, and time. She appears well-developed and well-nourished. No distress.  HENT:  Head: Normocephalic and atraumatic.  Eyes: Conjunctivae and EOM are normal.  Neck: Neck supple.  Cardiovascular: Normal rate, regular rhythm and normal heart sounds.   Pulses:      Radial pulses are 2+ on the right side, and 2+ on the left side.       Dorsalis pedis pulses are 1+ on the right side, and 1+ on the left side.  Cap refill 1 second  Pulmonary/Chest: Effort normal and breath sounds normal. No respiratory distress.  Abdominal: Bowel sounds are normal. There is no tenderness.  Musculoskeletal: Normal range of motion. She exhibits no edema.  Neurological: She is alert and oriented to person, place, and time. No cranial nerve deficit. She exhibits normal muscle tone. Coordination normal.  Skin: Skin is warm and dry.  Psychiatric: She has a normal mood and affect. Her behavior is normal.    ED Course  Procedures (including critical care time) DIAGNOSTIC STUDIES: Oxygen Saturation is 100% on RA, normal by my interpretation.    COORDINATION OF CARE: 9:08 PM-Discussed treatment plan which includes pain medication and suggest following up with PCP with pt at bedside and pt agreed to plan.     Labs Review Labs Reviewed  URINALYSIS, ROUTINE W REFLEX MICROSCOPIC - Abnormal; Notable for the following:    Specific Gravity, Urine 1.004 (*)    Leukocytes, UA TRACE (*)    All other components within normal limits  URINE MICROSCOPIC-ADD ON   Results for orders placed during the hospital encounter of 08/29/13  URINALYSIS, ROUTINE W REFLEX MICROSCOPIC      Result Value Ref Range   Color, Urine YELLOW  YELLOW   APPearance CLEAR  CLEAR   Specific Gravity, Urine 1.004 (*) 1.005 - 1.030   pH  8.0  5.0 -  8.0   Glucose, UA NEGATIVE  NEGATIVE mg/dL   Hgb urine dipstick NEGATIVE  NEGATIVE   Bilirubin Urine NEGATIVE  NEGATIVE   Ketones, ur NEGATIVE  NEGATIVE mg/dL   Protein, ur NEGATIVE  NEGATIVE mg/dL   Urobilinogen, UA 0.2  0.0 - 1.0 mg/dL   Nitrite NEGATIVE  NEGATIVE   Leukocytes, UA TRACE (*) NEGATIVE  URINE MICROSCOPIC-ADD ON      Result Value Ref Range   Squamous Epithelial / LPF RARE  RARE   WBC, UA 0-2  <3 WBC/hpf   RBC / HPF 0-2  <3 RBC/hpf   Bacteria, UA RARE  RARE     Imaging Review No results found.   EKG Interpretation None      MDM   Final diagnoses:  Myalgia      Patient with history of myalgias and perhaps joint pain for over a year. Getting worse. Patient had generalized blood work done by her primary care doctor and was referred to the hand surgeon to evaluate pain in hands. The workup for that is ongoing. Best we can tell patient has not had an MRI has not had a connective tissue disorder workup. Here treated pain with hydromorphone with some improvement. Would recommend or continue her Percocet and followup with her primary care Dr. for further evaluation and workup. No evidence urinary tract infection here today.   I personally performed the services described in this documentation, which was scribed in my presence. The recorded information has been reviewed and is accurate.       Fredia Sorrow, MD 08/29/13 2252

## 2013-08-29 NOTE — ED Notes (Signed)
Pt states nausea is less but is itching  Given benadryl po

## 2013-09-04 DIAGNOSIS — J309 Allergic rhinitis, unspecified: Secondary | ICD-10-CM | POA: Diagnosis not present

## 2013-09-04 DIAGNOSIS — F39 Unspecified mood [affective] disorder: Secondary | ICD-10-CM | POA: Diagnosis not present

## 2013-09-04 DIAGNOSIS — E7800 Pure hypercholesterolemia, unspecified: Secondary | ICD-10-CM | POA: Diagnosis not present

## 2013-09-04 DIAGNOSIS — F411 Generalized anxiety disorder: Secondary | ICD-10-CM | POA: Diagnosis not present

## 2013-09-04 DIAGNOSIS — K5901 Slow transit constipation: Secondary | ICD-10-CM | POA: Diagnosis not present

## 2013-09-04 DIAGNOSIS — Z0000 Encounter for general adult medical examination without abnormal findings: Secondary | ICD-10-CM | POA: Diagnosis not present

## 2013-09-04 DIAGNOSIS — IMO0001 Reserved for inherently not codable concepts without codable children: Secondary | ICD-10-CM | POA: Diagnosis not present

## 2013-09-04 DIAGNOSIS — M899 Disorder of bone, unspecified: Secondary | ICD-10-CM | POA: Diagnosis not present

## 2013-09-04 DIAGNOSIS — G43909 Migraine, unspecified, not intractable, without status migrainosus: Secondary | ICD-10-CM | POA: Diagnosis not present

## 2013-09-12 DIAGNOSIS — J309 Allergic rhinitis, unspecified: Secondary | ICD-10-CM | POA: Diagnosis not present

## 2013-09-16 ENCOUNTER — Ambulatory Visit: Payer: Medicare Other | Admitting: Dietician

## 2013-09-17 DIAGNOSIS — M069 Rheumatoid arthritis, unspecified: Secondary | ICD-10-CM | POA: Diagnosis not present

## 2013-09-17 DIAGNOSIS — M542 Cervicalgia: Secondary | ICD-10-CM | POA: Diagnosis not present

## 2013-09-17 DIAGNOSIS — G5600 Carpal tunnel syndrome, unspecified upper limb: Secondary | ICD-10-CM | POA: Diagnosis not present

## 2013-09-17 DIAGNOSIS — J309 Allergic rhinitis, unspecified: Secondary | ICD-10-CM | POA: Diagnosis not present

## 2013-09-26 DIAGNOSIS — J309 Allergic rhinitis, unspecified: Secondary | ICD-10-CM | POA: Diagnosis not present

## 2013-09-29 DIAGNOSIS — F3113 Bipolar disorder, current episode manic without psychotic features, severe: Secondary | ICD-10-CM | POA: Diagnosis not present

## 2013-09-30 DIAGNOSIS — J309 Allergic rhinitis, unspecified: Secondary | ICD-10-CM | POA: Diagnosis not present

## 2013-10-03 DIAGNOSIS — F411 Generalized anxiety disorder: Secondary | ICD-10-CM | POA: Diagnosis not present

## 2013-10-03 DIAGNOSIS — IMO0001 Reserved for inherently not codable concepts without codable children: Secondary | ICD-10-CM | POA: Diagnosis not present

## 2013-10-10 DIAGNOSIS — J309 Allergic rhinitis, unspecified: Secondary | ICD-10-CM | POA: Diagnosis not present

## 2013-10-13 DIAGNOSIS — F3113 Bipolar disorder, current episode manic without psychotic features, severe: Secondary | ICD-10-CM | POA: Diagnosis not present

## 2013-10-17 DIAGNOSIS — J309 Allergic rhinitis, unspecified: Secondary | ICD-10-CM | POA: Diagnosis not present

## 2013-10-24 DIAGNOSIS — J309 Allergic rhinitis, unspecified: Secondary | ICD-10-CM | POA: Diagnosis not present

## 2013-10-27 DIAGNOSIS — F3113 Bipolar disorder, current episode manic without psychotic features, severe: Secondary | ICD-10-CM | POA: Diagnosis not present

## 2013-10-28 DIAGNOSIS — M797 Fibromyalgia: Secondary | ICD-10-CM

## 2013-10-28 HISTORY — DX: Fibromyalgia: M79.7

## 2013-10-30 DIAGNOSIS — J309 Allergic rhinitis, unspecified: Secondary | ICD-10-CM | POA: Diagnosis not present

## 2013-11-04 DIAGNOSIS — M542 Cervicalgia: Secondary | ICD-10-CM | POA: Diagnosis not present

## 2013-11-06 DIAGNOSIS — F3113 Bipolar disorder, current episode manic without psychotic features, severe: Secondary | ICD-10-CM | POA: Diagnosis not present

## 2013-11-07 DIAGNOSIS — J309 Allergic rhinitis, unspecified: Secondary | ICD-10-CM | POA: Diagnosis not present

## 2013-11-17 DIAGNOSIS — M533 Sacrococcygeal disorders, not elsewhere classified: Secondary | ICD-10-CM | POA: Diagnosis not present

## 2013-11-17 DIAGNOSIS — M25579 Pain in unspecified ankle and joints of unspecified foot: Secondary | ICD-10-CM | POA: Diagnosis not present

## 2013-11-17 DIAGNOSIS — M2550 Pain in unspecified joint: Secondary | ICD-10-CM | POA: Diagnosis not present

## 2013-11-17 DIAGNOSIS — J309 Allergic rhinitis, unspecified: Secondary | ICD-10-CM | POA: Diagnosis not present

## 2013-11-17 DIAGNOSIS — M25569 Pain in unspecified knee: Secondary | ICD-10-CM | POA: Diagnosis not present

## 2013-11-17 DIAGNOSIS — IMO0001 Reserved for inherently not codable concepts without codable children: Secondary | ICD-10-CM | POA: Diagnosis not present

## 2013-11-17 DIAGNOSIS — R5381 Other malaise: Secondary | ICD-10-CM | POA: Diagnosis not present

## 2013-11-17 DIAGNOSIS — E559 Vitamin D deficiency, unspecified: Secondary | ICD-10-CM | POA: Diagnosis not present

## 2013-11-20 DIAGNOSIS — F39 Unspecified mood [affective] disorder: Secondary | ICD-10-CM | POA: Diagnosis not present

## 2013-11-20 DIAGNOSIS — F411 Generalized anxiety disorder: Secondary | ICD-10-CM | POA: Diagnosis not present

## 2013-11-20 DIAGNOSIS — G43909 Migraine, unspecified, not intractable, without status migrainosus: Secondary | ICD-10-CM | POA: Diagnosis not present

## 2013-11-20 DIAGNOSIS — IMO0001 Reserved for inherently not codable concepts without codable children: Secondary | ICD-10-CM | POA: Diagnosis not present

## 2013-11-20 DIAGNOSIS — Z23 Encounter for immunization: Secondary | ICD-10-CM | POA: Diagnosis not present

## 2013-11-25 ENCOUNTER — Telehealth: Payer: Self-pay

## 2013-11-25 NOTE — Telephone Encounter (Signed)
S/W PT IN REF TO NP APPT. ON 12/08/13@1 :30 REFERRING DR DEVESHWAR DX-IMMUNOGLOBINS ARE ALL OUT OF RANGE

## 2013-11-26 ENCOUNTER — Telehealth: Payer: Self-pay | Admitting: Hematology

## 2013-11-26 NOTE — Telephone Encounter (Signed)
C/D 11/25/13 for appt. 12/08/13

## 2013-11-27 ENCOUNTER — Other Ambulatory Visit (HOSPITAL_COMMUNITY)
Admission: RE | Admit: 2013-11-27 | Discharge: 2013-11-27 | Disposition: A | Discharge status: Home / Self Care | Payer: Medicare Other | Source: Ambulatory Visit | Attending: Gynecology | Admitting: Gynecology

## 2013-11-27 ENCOUNTER — Ambulatory Visit (INDEPENDENT_AMBULATORY_CARE_PROVIDER_SITE_OTHER): Payer: Medicare Other | Admitting: Gynecology

## 2013-11-27 ENCOUNTER — Encounter: Payer: Self-pay | Admitting: Gynecology

## 2013-11-27 VITALS — BP 110/70 | Ht 62.0 in | Wt 104.0 lb

## 2013-11-27 DIAGNOSIS — Z124 Encounter for screening for malignant neoplasm of cervix: Secondary | ICD-10-CM | POA: Diagnosis not present

## 2013-11-27 DIAGNOSIS — K648 Other hemorrhoids: Secondary | ICD-10-CM

## 2013-11-27 DIAGNOSIS — N912 Amenorrhea, unspecified: Secondary | ICD-10-CM

## 2013-11-27 DIAGNOSIS — D472 Monoclonal gammopathy: Secondary | ICD-10-CM

## 2013-11-27 DIAGNOSIS — Z1151 Encounter for screening for human papillomavirus (HPV): Secondary | ICD-10-CM | POA: Diagnosis not present

## 2013-11-27 DIAGNOSIS — K644 Residual hemorrhoidal skin tags: Secondary | ICD-10-CM

## 2013-11-27 DIAGNOSIS — N951 Menopausal and female climacteric states: Secondary | ICD-10-CM | POA: Diagnosis not present

## 2013-11-27 DIAGNOSIS — N952 Postmenopausal atrophic vaginitis: Secondary | ICD-10-CM | POA: Diagnosis not present

## 2013-11-27 DIAGNOSIS — Z30431 Encounter for routine checking of intrauterine contraceptive device: Secondary | ICD-10-CM | POA: Diagnosis not present

## 2013-11-27 HISTORY — DX: Monoclonal gammopathy: D47.2

## 2013-11-27 NOTE — Progress Notes (Signed)
Zanasia Hickson Chaloux Sep 28, 1960 [id number]        53 y.o.  [id number] for follow up exam.  Several issues noted below.  Past medical history,surgical history, problem list, medications, allergies, family history and social history were all reviewed and documented as reviewed in the EPIC chart.  ROS:  12 system ROS performed with pertinent positives and negatives included in the history, assessment and plan.   Additional significant findings :  Fibromyalgia related   Exam: Kim assistant Filed Vitals:   11/27/13 1125  BP: 110/70  Height: 5\' 2"  (1.575 m)  Weight: 104 lb (47.174 kg)   General appearance:  Normal affect, orientation and appearance. Skin: Grossly normal HEENT: Without gross lesions.  No cervical or supraclavicular adenopathy. Thyroid normal.  Lungs:  Clear without wheezing, rales or rhonchi Cardiac: RR, without RMG Abdominal:  Soft, nontender, without masses, guarding, rebound, organomegaly or hernia Breasts:  Examined lying and sitting without masses, retractions, discharge or axillary adenopathy. Pelvic:  Ext/BUS/vagina with mild atrophic changes  Cervix normal with IUD string visualized. Pap/HPV  Uterus anteverted, normal size, shape and contour, midline and mobile nontender   Adnexa  Without masses or tenderness    Anus and perineum  Normal with old external hemorrhoids.  Rectovaginal  Normal sphincter tone without palpated masses or tenderness.    Assessment/Plan:  53 y.o. [id number] female for follow up exam.   1. Postmenopausal. Having some mild symptoms to include hot flashes and night sweats. Has noticed an exacerbation of her depression and fibromyalgia symptoms over the past year. Reviewed that this may be related to her perimenopausal status. Options to include HRT discussed. I reviewed the WHI study with increased risk of stroke heart attack DVT and breast cancer. Transdermal first pass effect advantage reviewed. She does have a Mirena IUD off brand labeling option to  use this for endometrial protection with a patch estrogen discussed. Patient is not interested in doing this at this time but prefers to monitor her symptoms. Will call if she changes her mind. 2. Mirena IUD 09/2009. Due to be removed next year.  FSH  99.8 last year. Doing no bleeding at all. Will leave in place this year for menstrual suppression in the event she would be having perimenopausal irregular bleeding. Plan to remove next year and keep menstrual calendar. 3. Pap smear/HPV negative 2014.  Pap/HPV today. History of ascus negative high-risk HPV 2007 with negative colposcopy biopsy. Positive high risk HPV screen 2013 with negative cytology. Assuming this Pap smear normal them plan expectant management. If otherwise then will triage based upon results. 4. Old external hemorrhoids. Patient has prescription for hydrocortisone cream and lidocaine that she uses intermittently when they flare. 5. Mammography 01/2013. I reminded her to schedule it the end of this year. SBE monthly reviewed. 6. Colonoscopy reported 2013. Repeat at their recommended interval. 7. DEXA reported normal2014 through her primary physician. I do not have a copy of these results.  Increased calcium vitamin D reviewed. 8. Health maintenance. No routine blood work done as this is done through her primary physician's office. Follow up one year, sooner as needed.     Anastasio Auerbach MD, 12:10 PM 11/27/2013

## 2013-11-27 NOTE — Patient Instructions (Signed)
You may obtain a copy of any labs that were done today by logging onto MyChart as outlined in the instructions provided with your AVS (after visit summary). The office will not call with normal lab results but certainly if there are any significant abnormalities then we will contact you.   Health Maintenance, Female A healthy lifestyle and preventative care can promote health and wellness.  Maintain regular health, dental, and eye exams.  Eat a healthy diet. Foods like vegetables, fruits, whole grains, low-fat dairy products, and lean protein foods contain the nutrients you need without too many calories. Decrease your intake of foods high in solid fats, added sugars, and salt. Get information about a proper diet from your caregiver, if necessary.  Regular physical exercise is one of the most important things you can do for your health. Most adults should get at least 150 minutes of moderate-intensity exercise (any activity that increases your heart rate and causes you to sweat) each week. In addition, most adults need muscle-strengthening exercises on 2 or more days a week.   Maintain a healthy weight. The body mass index (BMI) is a screening tool to identify possible weight problems. It provides an estimate of body fat based on height and weight. Your caregiver can help determine your BMI, and can help you achieve or maintain a healthy weight. For adults 20 years and older:  A BMI below 18.5 is considered underweight.  A BMI of 18.5 to 24.9 is normal.  A BMI of 25 to 29.9 is considered overweight.  A BMI of 30 and above is considered obese.  Maintain normal blood lipids and cholesterol by exercising and minimizing your intake of saturated fat. Eat a balanced diet with plenty of fruits and vegetables. Blood tests for lipids and cholesterol should begin at age 61 and be repeated every 5 years. If your lipid or cholesterol levels are high, you are over 50, or you are a high risk for heart  disease, you may need your cholesterol levels checked more frequently.Ongoing high lipid and cholesterol levels should be treated with medicines if diet and exercise are not effective.  If you smoke, find out from your caregiver how to quit. If you do not use tobacco, do not start.  Lung cancer screening is recommended for adults aged 33 80 years who are at high risk for developing lung cancer because of a history of smoking. Yearly low-dose computed tomography (CT) is recommended for people who have at least a 30-pack-year history of smoking and are a current smoker or have quit within the past 15 years. A pack year of smoking is smoking an average of 1 pack of cigarettes a day for 1 year (for example: 1 pack a day for 30 years or 2 packs a day for 15 years). Yearly screening should continue until the smoker has stopped smoking for at least 15 years. Yearly screening should also be stopped for people who develop a health problem that would prevent them from having lung cancer treatment.  If you are pregnant, do not drink alcohol. If you are breastfeeding, be very cautious about drinking alcohol. If you are not pregnant and choose to drink alcohol, do not exceed 1 drink per day. One drink is considered to be 12 ounces (355 mL) of beer, 5 ounces (148 mL) of wine, or 1.5 ounces (44 mL) of liquor.  Avoid use of street drugs. Do not share needles with anyone. Ask for help if you need support or instructions about stopping  the use of drugs.  High blood pressure causes heart disease and increases the risk of stroke. Blood pressure should be checked at least every 1 to 2 years. Ongoing high blood pressure should be treated with medicines, if weight loss and exercise are not effective.  If you are 59 to 51 years old, ask your caregiver if you should take aspirin to prevent strokes.  Diabetes screening involves taking a blood sample to check your fasting blood sugar level. This should be done once every 3  years, after age 91, if you are within normal weight and without risk factors for diabetes. Testing should be considered at a younger age or be carried out more frequently if you are overweight and have at least 1 risk factor for diabetes.  Breast cancer screening is essential preventative care for women. You should practice "breast self-awareness." This means understanding the normal appearance and feel of your breasts and may include breast self-examination. Any changes detected, no matter how small, should be reported to a caregiver. Women in their 66s and 30s should have a clinical breast exam (CBE) by a caregiver as part of a regular health exam every 1 to 3 years. After age 101, women should have a CBE every year. Starting at age 100, women should consider having a mammogram (breast X-ray) every year. Women who have a family history of breast cancer should talk to their caregiver about genetic screening. Women at a high risk of breast cancer should talk to their caregiver about having an MRI and a mammogram every year.  Breast cancer gene (BRCA)-related cancer risk assessment is recommended for women who have family members with BRCA-related cancers. BRCA-related cancers include breast, ovarian, tubal, and peritoneal cancers. Having family members with these cancers may be associated with an increased risk for harmful changes (mutations) in the breast cancer genes BRCA1 and BRCA2. Results of the assessment will determine the need for genetic counseling and BRCA1 and BRCA2 testing.  The Pap test is a screening test for cervical cancer. Women should have a Pap test starting at age 57. Between ages 25 and 35, Pap tests should be repeated every 2 years. Beginning at age 37, you should have a Pap test every 3 years as long as the past 3 Pap tests have been normal. If you had a hysterectomy for a problem that was not cancer or a condition that could lead to cancer, then you no longer need Pap tests. If you are  between ages 50 and 76, and you have had normal Pap tests going back 10 years, you no longer need Pap tests. If you have had past treatment for cervical cancer or a condition that could lead to cancer, you need Pap tests and screening for cancer for at least 20 years after your treatment. If Pap tests have been discontinued, risk factors (such as a new sexual partner) need to be reassessed to determine if screening should be resumed. Some women have medical problems that increase the chance of getting cervical cancer. In these cases, your caregiver may recommend more frequent screening and Pap tests.  The human papillomavirus (HPV) test is an additional test that may be used for cervical cancer screening. The HPV test looks for the virus that can cause the cell changes on the cervix. The cells collected during the Pap test can be tested for HPV. The HPV test could be used to screen women aged 44 years and older, and should be used in women of any age  who have unclear Pap test results. After the age of 55, women should have HPV testing at the same frequency as a Pap test.  Colorectal cancer can be detected and often prevented. Most routine colorectal cancer screening begins at the age of 44 and continues through age 20. However, your caregiver may recommend screening at an earlier age if you have risk factors for colon cancer. On a yearly basis, your caregiver may provide home test kits to check for hidden blood in the stool. Use of a small camera at the end of a tube, to directly examine the colon (sigmoidoscopy or colonoscopy), can detect the earliest forms of colorectal cancer. Talk to your caregiver about this at age 86, when routine screening begins. Direct examination of the colon should be repeated every 5 to 10 years through age 13, unless early forms of pre-cancerous polyps or small growths are found.  Hepatitis C blood testing is recommended for all people born from [birth year] through 1963 and any  individual with known risks for hepatitis C.  Practice safe sex. Use condoms and avoid high-risk sexual practices to reduce the spread of sexually transmitted infections (STIs). Sexually active women aged 36 and younger should be checked for Chlamydia, which is a common sexually transmitted infection. Older women with new or multiple partners should also be tested for Chlamydia. Testing for other STIs is recommended if you are sexually active and at increased risk.  Osteoporosis is a disease in which the bones lose minerals and strength with aging. This can result in serious bone fractures. The risk of osteoporosis can be identified using a bone density scan. Women ages 20 and over and women at risk for fractures or osteoporosis should discuss screening with their caregivers. Ask your caregiver whether you should be taking a calcium supplement or vitamin D to reduce the rate of osteoporosis.  Menopause can be associated with physical symptoms and risks. Hormone replacement therapy is available to decrease symptoms and risks. You should talk to your caregiver about whether hormone replacement therapy is right for you.  Use sunscreen. Apply sunscreen liberally and repeatedly throughout the day. You should seek shade when your shadow is shorter than you. Protect yourself by wearing long sleeves, pants, a wide-brimmed hat, and sunglasses year round, whenever you are outdoors.  Notify your caregiver of new moles or changes in moles, especially if there is a change in shape or color. Also notify your caregiver if a mole is larger than the size of a pencil eraser.  Stay current with your immunizations. Document Released: 08/29/2010 Document Revised: 06/10/2012 Document Reviewed: 08/29/2010 [facility] Patient Information 2014 [employer].

## 2013-12-01 LAB — CYTOLOGY - PAP

## 2013-12-02 DIAGNOSIS — J3081 Allergic rhinitis due to animal (cat) (dog) hair and dander: Secondary | ICD-10-CM | POA: Diagnosis not present

## 2013-12-02 DIAGNOSIS — F3113 Bipolar disorder, current episode manic without psychotic features, severe: Secondary | ICD-10-CM | POA: Diagnosis not present

## 2013-12-02 DIAGNOSIS — J3089 Other allergic rhinitis: Secondary | ICD-10-CM | POA: Diagnosis not present

## 2013-12-02 DIAGNOSIS — J301 Allergic rhinitis due to pollen: Secondary | ICD-10-CM | POA: Diagnosis not present

## 2013-12-04 DIAGNOSIS — F3113 Bipolar disorder, current episode manic without psychotic features, severe: Secondary | ICD-10-CM | POA: Diagnosis not present

## 2013-12-05 DIAGNOSIS — J3081 Allergic rhinitis due to animal (cat) (dog) hair and dander: Secondary | ICD-10-CM | POA: Diagnosis not present

## 2013-12-05 DIAGNOSIS — J301 Allergic rhinitis due to pollen: Secondary | ICD-10-CM | POA: Diagnosis not present

## 2013-12-05 DIAGNOSIS — J3089 Other allergic rhinitis: Secondary | ICD-10-CM | POA: Diagnosis not present

## 2013-12-08 ENCOUNTER — Telehealth: Payer: Self-pay | Admitting: Hematology

## 2013-12-08 ENCOUNTER — Ambulatory Visit (HOSPITAL_BASED_OUTPATIENT_CLINIC_OR_DEPARTMENT_OTHER): Payer: Medicare Other | Admitting: Hematology

## 2013-12-08 ENCOUNTER — Other Ambulatory Visit (HOSPITAL_BASED_OUTPATIENT_CLINIC_OR_DEPARTMENT_OTHER): Payer: Medicare Other

## 2013-12-08 ENCOUNTER — Other Ambulatory Visit: Payer: Medicare Other

## 2013-12-08 ENCOUNTER — Ambulatory Visit: Payer: Medicare Other

## 2013-12-08 ENCOUNTER — Encounter: Payer: Self-pay | Admitting: Hematology

## 2013-12-08 VITALS — BP 111/60 | HR 67 | Temp 98.1°F | Resp 18 | Ht 62.0 in | Wt 122.0 lb

## 2013-12-08 DIAGNOSIS — D892 Hypergammaglobulinemia, unspecified: Secondary | ICD-10-CM

## 2013-12-08 DIAGNOSIS — D472 Monoclonal gammopathy: Secondary | ICD-10-CM | POA: Diagnosis not present

## 2013-12-08 LAB — COMPREHENSIVE METABOLIC PANEL (CC13)
ALT: 26 U/L (ref 0–55)
AST: 20 U/L (ref 5–34)
Albumin: 4.3 g/dL (ref 3.5–5.0)
Alkaline Phosphatase: 68 U/L (ref 40–150)
Anion Gap: 9 mEq/L (ref 3–11)
BUN: 11.4 mg/dL (ref 7.0–26.0)
CO2: 25 mEq/L (ref 22–29)
Calcium: 9.8 mg/dL (ref 8.4–10.4)
Chloride: 108 mEq/L (ref 98–109)
Creatinine: 0.8 mg/dL (ref 0.6–1.1)
Glucose: 99 mg/dl (ref 70–140)
Potassium: 3.7 mEq/L (ref 3.5–5.1)
Sodium: 143 mEq/L (ref 136–145)
Total Bilirubin: 0.33 mg/dL (ref 0.20–1.20)
Total Protein: 7.5 g/dL (ref 6.4–8.3)

## 2013-12-08 LAB — CBC WITH DIFFERENTIAL/PLATELET
BASO%: 0.6 % (ref 0.0–2.0)
Basophils Absolute: 0 10*3/uL (ref 0.0–0.1)
EOS%: 3.2 % (ref 0.0–7.0)
Eosinophils Absolute: 0.2 10*3/uL (ref 0.0–0.5)
HCT: 41.7 % (ref 34.8–46.6)
HGB: 13.9 g/dL (ref 11.6–15.9)
LYMPH%: 35.7 % (ref 14.0–49.7)
MCH: 30.6 pg (ref 25.1–34.0)
MCHC: 33.2 g/dL (ref 31.5–36.0)
MCV: 92.1 fL (ref 79.5–101.0)
MONO#: 0.4 10*3/uL (ref 0.1–0.9)
MONO%: 7.5 % (ref 0.0–14.0)
NEUT#: 2.6 10*3/uL (ref 1.5–6.5)
NEUT%: 53 % (ref 38.4–76.8)
Platelets: 277 10*3/uL (ref 145–400)
RBC: 4.53 10*6/uL (ref 3.70–5.45)
RDW: 12.1 % (ref 11.2–14.5)
WBC: 4.9 10*3/uL (ref 3.9–10.3)
lymph#: 1.8 10*3/uL (ref 0.9–3.3)

## 2013-12-08 NOTE — Progress Notes (Signed)
Checked in new patient with no financial issues prior to seeing the dr. She has appt card and [place] been [place] of the [place]. She has primary/secondary

## 2013-12-08 NOTE — Telephone Encounter (Signed)
Pt confirmed labs/ov per 10/12 POF, gave pt AVS.... KJ °

## 2013-12-09 ENCOUNTER — Encounter: Payer: Self-pay | Admitting: Hematology
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
## 2013-12-10 ENCOUNTER — Ambulatory Visit (HOSPITAL_COMMUNITY)
Admission: RE | Admit: 2013-12-10 | Discharge: 2013-12-10 | Disposition: A | Discharge status: Home / Self Care | Payer: Medicare Other | Source: Ambulatory Visit | Attending: Hematology | Admitting: Hematology

## 2013-12-10 DIAGNOSIS — D472 Monoclonal gammopathy: Secondary | ICD-10-CM | POA: Insufficient documentation

## 2013-12-10 DIAGNOSIS — J3081 Allergic rhinitis due to animal (cat) (dog) hair and dander: Secondary | ICD-10-CM | POA: Diagnosis not present

## 2013-12-10 DIAGNOSIS — J301 Allergic rhinitis due to pollen: Secondary | ICD-10-CM | POA: Diagnosis not present

## 2013-12-10 DIAGNOSIS — C9000 Multiple myeloma not having achieved remission: Secondary | ICD-10-CM | POA: Diagnosis not present

## 2013-12-10 DIAGNOSIS — J3089 Other allergic rhinitis: Secondary | ICD-10-CM | POA: Diagnosis not present

## 2013-12-11 LAB — SPEP & IFE WITH QIG
Albumin ELP: 67.1 % — ABNORMAL HIGH (ref 55.8–66.1)
Alpha-1-Globulin: 3.7 % (ref 2.9–4.9)
Alpha-2-Globulin: 9.3 % (ref 7.1–11.8)
Beta 2: 7.6 % — ABNORMAL HIGH (ref 3.2–6.5)
Beta Globulin: 5.8 % (ref 4.7–7.2)
Gamma Globulin: 6.5 % — ABNORMAL LOW (ref 11.1–18.8)
IgA: 401 mg/dL — ABNORMAL HIGH (ref 69–380)
IgG (Immunoglobin G), Serum: 485 mg/dL — ABNORMAL LOW (ref 690–1700)
IgM, Serum: 54 mg/dL (ref 52–322)
M-Spike, %: 0.37 g/dL
Total Protein, Serum Electrophoresis: 7.2 g/dL (ref 6.0–8.3)

## 2013-12-11 LAB — VISCOSITY, SERUM: Viscosity, Serum: 1.6 rel to H2O (ref 1.5–1.9)

## 2013-12-11 LAB — KAPPA/LAMBDA LIGHT CHAINS
Kappa free light chain: 0.94 mg/dL (ref 0.33–1.94)
Kappa:Lambda Ratio: 0.58 (ref 0.26–1.65)
Lambda Free Lght Chn: 1.62 mg/dL (ref 0.57–2.63)

## 2013-12-11 LAB — BETA 2 MICROGLOBULIN, SERUM: Beta-2 Microglobulin: 1.65 mg/L (ref <=2.51)

## 2013-12-15 ENCOUNTER — Other Ambulatory Visit: Payer: Self-pay | Admitting: Radiology

## 2013-12-15 DIAGNOSIS — J3081 Allergic rhinitis due to animal (cat) (dog) hair and dander: Secondary | ICD-10-CM | POA: Diagnosis not present

## 2013-12-15 DIAGNOSIS — J301 Allergic rhinitis due to pollen: Secondary | ICD-10-CM | POA: Diagnosis not present

## 2013-12-15 DIAGNOSIS — J3089 Other allergic rhinitis: Secondary | ICD-10-CM | POA: Diagnosis not present

## 2013-12-16 ENCOUNTER — Other Ambulatory Visit: Payer: Self-pay | Admitting: Radiology

## 2013-12-16 DIAGNOSIS — F3113 Bipolar disorder, current episode manic without psychotic features, severe: Secondary | ICD-10-CM | POA: Diagnosis not present

## 2013-12-17 ENCOUNTER — Encounter (HOSPITAL_COMMUNITY): Payer: Self-pay | Admitting: Pharmacy Technician

## 2013-12-18 ENCOUNTER — Encounter (HOSPITAL_COMMUNITY): Payer: Self-pay

## 2013-12-18 ENCOUNTER — Ambulatory Visit (HOSPITAL_COMMUNITY)
Admission: RE | Admit: 2013-12-18 | Discharge: 2013-12-18 | Disposition: A | Discharge status: Home / Self Care | Payer: Medicare Other | Source: Ambulatory Visit | Attending: Hematology | Admitting: Hematology

## 2013-12-18 DIAGNOSIS — D472 Monoclonal gammopathy: Secondary | ICD-10-CM

## 2013-12-18 DIAGNOSIS — K219 Gastro-esophageal reflux disease without esophagitis: Secondary | ICD-10-CM | POA: Insufficient documentation

## 2013-12-18 DIAGNOSIS — Z720 Tobacco use: Secondary | ICD-10-CM | POA: Diagnosis not present

## 2013-12-18 DIAGNOSIS — D892 Hypergammaglobulinemia, unspecified: Secondary | ICD-10-CM | POA: Diagnosis not present

## 2013-12-18 DIAGNOSIS — Z79899 Other long term (current) drug therapy: Secondary | ICD-10-CM | POA: Insufficient documentation

## 2013-12-18 DIAGNOSIS — C9000 Multiple myeloma not having achieved remission: Secondary | ICD-10-CM | POA: Diagnosis not present

## 2013-12-18 DIAGNOSIS — F17210 Nicotine dependence, cigarettes, uncomplicated: Secondary | ICD-10-CM | POA: Diagnosis not present

## 2013-12-18 DIAGNOSIS — D4989 Neoplasm of unspecified behavior of other specified sites: Secondary | ICD-10-CM | POA: Diagnosis not present

## 2013-12-18 DIAGNOSIS — Z85830 Personal history of malignant neoplasm of bone: Secondary | ICD-10-CM | POA: Diagnosis not present

## 2013-12-18 DIAGNOSIS — D72819 Decreased white blood cell count, unspecified: Secondary | ICD-10-CM | POA: Diagnosis not present

## 2013-12-18 HISTORY — PX: BONE MARROW BIOPSY: SHX199

## 2013-12-18 LAB — CBC
HCT: 38.1 % (ref 36.0–46.0)
Hemoglobin: 13.2 g/dL (ref 12.0–15.0)
MCH: 31.1 pg (ref 26.0–34.0)
MCHC: 34.6 g/dL (ref 30.0–36.0)
MCV: 89.6 fL (ref 78.0–100.0)
Platelets: 221 10*3/uL (ref 150–400)
RBC: 4.25 MIL/uL (ref 3.87–5.11)
RDW: 12 % (ref 11.5–15.5)
WBC: 3.6 10*3/uL — ABNORMAL LOW (ref 4.0–10.5)

## 2013-12-18 LAB — APTT: aPTT: 29 seconds (ref 24–37)

## 2013-12-18 LAB — BONE MARROW EXAM

## 2013-12-18 LAB — PROTIME-INR
INR: 0.96 (ref 0.00–1.49)
Prothrombin Time: 12.9 seconds (ref 11.6–15.2)

## 2013-12-18 MED ORDER — MIDAZOLAM HCL 2 MG/2ML IJ SOLN
INTRAMUSCULAR | Status: AC | PRN
  Administered 2013-12-18: 2 mg via INTRAVENOUS

## 2013-12-18 MED ORDER — FENTANYL CITRATE 0.05 MG/ML IJ SOLN
INTRAMUSCULAR | Status: DC
Start: 2013-12-18 — End: 2013-12-19
  Filled 2013-12-18: qty 4

## 2013-12-18 MED ORDER — FENTANYL CITRATE 0.05 MG/ML IJ SOLN
INTRAMUSCULAR | Status: AC | PRN
  Administered 2013-12-18 (×2): 50 ug via INTRAVENOUS

## 2013-12-18 MED ORDER — MIDAZOLAM HCL 2 MG/2ML IJ SOLN
INTRAMUSCULAR | Status: AC
  Filled 2013-12-18: qty 4

## 2013-12-18 MED ORDER — SODIUM CHLORIDE 0.9 % IV SOLN
Freq: Once | INTRAVENOUS | Status: AC
  Administered 2013-12-18: 500 mL via INTRAVENOUS

## 2013-12-18 NOTE — Discharge Instructions (Signed)
Bone Marrow Aspiration, Bone Marrow Biopsy °Care After °Read the instructions outlined below and refer to this sheet in the next few weeks. These discharge instructions provide you with general information on caring for yourself after you leave the hospital. Your caregiver may also give you specific instructions. While your treatment has been planned according to the most current medical practices available, unavoidable complications occasionally occur. If you have any problems or questions after discharge, call your caregiver. °FINDING OUT THE RESULTS OF YOUR TEST °Not all test results are available during your visit. If your test results are not back during the visit, make an appointment with your caregiver to find out the results. Do not assume everything is normal if you have not heard from your caregiver or the medical facility. It is important for you to follow up on all of your test results.  °HOME CARE INSTRUCTIONS  °You have had sedation and may be sleepy or dizzy. Your thinking may not be as clear as usual. For the next 24 hours: °· Only take over-the-counter or prescription medicines for pain, discomfort, and or fever as directed by your caregiver. °· Do not drink alcohol. °· Do not smoke. °· Do not drive. °· Do not make important legal decisions. °· Do not operate heavy machinery. °· Do not care for small children by yourself. °· Keep your dressing clean and dry. You may replace dressing with a bandage after 24 hours. °· You may take a bath or shower after 24 hours. °· Use an ice pack for 20 minutes every 2 hours while awake for pain as needed. °SEEK MEDICAL CARE IF:  °· There is redness, swelling, or increasing pain at the biopsy site. °· There is pus coming from the biopsy site. °· There is drainage from a biopsy site lasting longer than one day. °· An unexplained oral temperature above 102° F (38.9° C) develops. °SEEK IMMEDIATE MEDICAL CARE IF:  °· You develop a rash. °· You have difficulty  breathing. °· You develop any reaction or side effects to medications given. °Document Released: 09/02/2004 Document Revised: 05/08/2011 Document Reviewed: 02/11/2008 °[employer]® Patient Information ©2015 [employer]. This information is not intended to replace advice given to you by your health care provider. Make sure you discuss any questions you have with your health care provider. °Conscious Sedation, Adult, Care After °Refer to this sheet in the next few weeks. These instructions provide you with information on caring for yourself after your procedure. Your health care provider may also give you more specific instructions. Your treatment has been planned according to current medical practices, but problems sometimes occur. Call your health care provider if you have any problems or questions after your procedure. °WHAT TO EXPECT AFTER THE PROCEDURE  °After your procedure: °· You may feel sleepy, clumsy, and have poor balance for several hours. °· Vomiting may occur if you eat too soon after the procedure. °HOME CARE INSTRUCTIONS °· Do not participate in any activities where you could become injured for at least 24 hours. Do not: °¨ Drive. °¨ Swim. °¨ Ride a bicycle. °¨ Operate heavy machinery. °¨ Cook. °¨ Use power tools. °¨ Climb ladders. °¨ Work from a high place. °· Do not make important decisions or sign legal documents until you are improved. °· If you vomit, drink water, juice, or soup when you can drink without vomiting. Make sure you have little or no nausea before eating solid foods. °· Only take over-the-counter or prescription medicines for pain, discomfort, or fever   as directed by your health care provider.  Make sure you and your family fully understand everything about the medicines given to you, including what side effects may occur.  You should not drink alcohol, take sleeping pills, or take medicines that cause drowsiness for at least 24 hours.  If you smoke, do not smoke without  supervision.  If you are feeling better, you may resume normal activities 24 hours after you were sedated.  Keep all appointments with your health care provider. SEEK MEDICAL CARE IF:  Your skin is pale or bluish in color.  You continue to feel nauseous or vomit.  Your pain is getting worse and is not helped by medicine.  You have bleeding or swelling.  You are still sleepy or feeling clumsy after 24 hours. SEEK IMMEDIATE MEDICAL CARE IF:  You develop a rash.  You have difficulty breathing.  You develop any type of allergic problem.  You have a fever. MAKE SURE YOU:  Understand these instructions.  Will watch your condition.  Will get help right away if you are not doing well or get worse. Document Released: 12/04/2012 Document Reviewed: 12/04/2012 [facility] Patient Information 2015 [place], [place]. This information is not intended to replace advice given to you by your health care provider. Make sure you discuss any questions you have with your health care provider.

## 2013-12-18 NOTE — Procedures (Signed)
Interventional Radiology Procedure Note  Procedure: CT guided aspirate and core biopsy of left iliac bone Complications: None Recommendations: - Bedrest supine x 3 hrs - Hydrocodone PRN  Pain - Follow biopsy results  Signed,  Dulcy Fanny. Earleen Newport, DO

## 2013-12-18 NOTE — H&P (Signed)
Chief Complaint: "I am here for a bone marrow biopsy."  Referring Physician(s): Sehbai,Aasim Shaheen  History of Present Illness: Danielle Bruce is a 53 y.o. female with IgA paraproteinemia scheduled today for image guided bone marrow biopsy. She denies any chest pain, shortness of breath or palpitations. She denies any active signs of bleeding or excessive bruising. She denies any recent fever or chills. The patient denies any history of sleep apnea or chronic oxygen use. She has previously tolerated sedation without complications.    Past Medical History  Diagnosis Date  . GERD (gastroesophageal reflux disease)   . 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 Migraines   . Bipolar 2 disorder   . IBS (irritable bowel syndrome)   . Constipation   . ASCUS (atypical squamous cells of undetermined significance) on Pap smear 05/06/05    NEG HIGH RISK HPV--C&B BIOPSY BENIGN 12/2005  . Anxiety   . 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    cytology negative  . Fibromyalgia     Past Surgical History  Procedure Laterality Date  . Hemorrhoid surgery  1993    x3  . Cesarean section  [id number]  . Shoulder surgery  2007/2008  . Spine surgery  2010    cervical  . Dilation and curettage of uterus    . Pelvic laparoscopy    . Intrauterine device insertion  01/04/2010    MIRENA  . Laparoscopy N/A 10/16/2012    Procedure: LAPAROSCOPY DIAGNOSTIC;  Surgeon: Joyice Faster. Cornett, MD;  Location: [facility];  Service: General;  Laterality: N/A;  . Cholecystectomy N/A 10/16/2012    Procedure: LAPAROSCOPIC CHOLECYSTECTOMY;  Surgeon: Joyice Faster. Cornett, MD;  Location: [facility];  Service: General;  Laterality: N/A;  . Laparoscopic lysis of adhesions N/A 10/16/2012    Procedure: LAPAROSCOPIC LYSIS OF ADHESIONS;  Surgeon: Joyice Faster. Cornett, MD;  Location: [facility];  Service: General;  Laterality: N/A;    Allergies: Sulfa antibiotics; Flagyl; Lactose intolerance (gi); and Morphine and related  Medications: Prior to Admission  medications   Medication Sig Start Date End Date Taking? Authorizing Provider  albuterol (PROVENTIL HFA;VENTOLIN HFA) 108 (90 BASE) MCG/ACT inhaler Inhale 2 puffs into the lungs every 6 (six) hours as needed for wheezing or shortness of breath.    Yes Historical Provider, MD  Ascorbic Acid (VITAMIN C) 1000 MG tablet Take 1,000 mg by mouth at bedtime.   Yes Historical Provider, MD  Ascorbic Acid (VITAMIN C) 250 MG CHEW Chew 500 mg by mouth at bedtime.   Yes Historical Provider, MD  azelastine (ASTELIN) 0.1 % nasal spray Place 2 sprays into both nostrils 2 (two) times daily. Use in each nostril as directed   Yes Historical Provider, MD  beclomethasone (QVAR) 40 MCG/ACT inhaler Inhale 2 puffs into the lungs 2 (two) times daily.   Yes Historical Provider, MD  BEPREVE 1.5 % SOLN Place 1 drop into both eyes 2 (two) times daily.  04/03/13  Yes Historical Provider, MD  diphenhydrAMINE (BENADRYL) 25 mg capsule Take 50 mg by mouth every 6 (six) hours as needed for itching or allergies.    Yes Historical Provider, MD  docusate sodium (COLACE) 100 MG capsule Take 100 mg by mouth every other day. Alternates with the senokot   Yes Historical Provider, MD  esomeprazole (NEXIUM) 40 MG capsule Take 40 mg by mouth 2 (two) times daily.   Yes Historical Provider, MD  fluvoxaMINE (LUVOX) 25 MG tablet Take 25 mg by mouth every morning.  Yes Historical Provider, MD  gabapentin (NEURONTIN) 300 MG capsule Take 300 mg by mouth 2 (two) times daily.    Yes Historical Provider, MD  hyoscyamine (LEVSIN SL) 0.125 MG SL tablet Take 0.125 mg by mouth every 6 (six) hours as needed for cramping.  10/18/12  Yes Historical Provider, MD  lamoTRIgine (LAMICTAL) 200 MG tablet Take 200 mg by mouth at bedtime.   Yes Historical Provider, MD  LORazepam (ATIVAN) 1 MG tablet Take 1 mg by mouth every 8 (eight) hours as needed for anxiety. (takes 2 mg when having a medical procedure.)   Yes Historical Provider, MD  Magnesium 500 MG TABS Take 1  tablet by mouth at bedtime.   Yes Historical Provider, MD  Melatonin 10 MG CAPS Take 1 tablet by mouth at bedtime.    Yes Historical Provider, MD  montelukast (SINGULAIR) 10 MG tablet Take 10 mg by mouth at bedtime.   Yes Historical Provider, MD  Multiple Vitamin (MULTIVITAMIN WITH MINERALS) TABS Take 1 tablet by mouth at bedtime.   Yes Historical Provider, MD  OVER THE COUNTER MEDICATION Take 1 tablet by mouth daily with supper. Calcium-Vitamin D- K2 -600-100-90 mg   Yes Historical Provider, MD  oxyCODONE-acetaminophen (PERCOCET/ROXICET) 5-325 MG per tablet Take 1-2 tablets by mouth every 6 (six) hours as needed for moderate pain or severe pain.   Yes Historical Provider, MD  polyethylene glycol (MIRALAX / GLYCOLAX) packet Take 34 g by mouth at bedtime.    Yes Historical Provider, MD  promethazine (PHENERGAN) 25 MG tablet Take 25 mg by mouth every 6 (six) hours as needed for nausea.    Yes Historical Provider, MD  risperiDONE (RISPERDAL) 0.25 MG tablet Take 0.25 mg by mouth. As needed for an anxiety attack   Yes Historical Provider, MD  senna (SENOKOT) 8.6 MG TABS Take 1 tablet by mouth every other day.    Yes Historical Provider, MD  topiramate (TOPAMAX) 25 MG tablet Take 25 mg by mouth 2 (two) times daily.   Yes Historical Provider, MD  traZODone (DESYREL) 50 MG tablet Take 50 mg by mouth at bedtime.     Yes Historical Provider, MD  levonorgestrel (MIRENA) 20 MCG/24HR IUD 1 each by Intrauterine route once.    Historical Provider, MD  meclizine (ANTIVERT) 25 MG tablet Take 25 mg by mouth 3 (three) times daily as needed for dizziness.    Historical Provider, MD  methylphenidate [facility]) 10 mg/9hr patch Place 1 patch onto the skin as needed. wear patch for 9 hours only each day    Historical Provider, MD  Omega-3 Fatty Acids (OMEGA 3 PO) Take 1 tablet by mouth at bedtime.    Historical Provider, MD  PRESCRIPTION MEDICATION Inject 1 each into the skin 2 (two) times a week. Allergy shot for Cats.     Historical Provider, MD  PRESCRIPTION MEDICATION Inject 1 each into the skin 2 (two) times a week. Combination Allergy shot.    Historical Provider, MD    Family History  Problem Relation Age of Onset  . Diabetes Father   . Hypertension Father   . Hyperlipidemia Father   . Heart disease Maternal Grandfather   . Heart disease Paternal Grandmother   . Heart disease Paternal Grandfather     History   Social History  . Marital Status: Divorced    Spouse Name: N/A    Number of Children: N/A  . Years of Education: N/A   Social History Main Topics  . Smoking status: Current Some  Day Smoker    Types: Cigarettes  . Smokeless tobacco: Never Used  . Alcohol Use: Yes     Comment: rare  . Drug Use: No  . Sexual Activity: No     Comment: Mirena inserted 01/04/10   Other Topics Concern  . None   Social History Narrative  . None    Review of Systems: A 12 point ROS discussed and pertinent positives are indicated in the HPI above.  All other systems are negative.  Review of Systems  Vital Signs: BP 112/70  Pulse 86  Temp(Src) 98.2 F (36.8 C) (Oral)  Resp 18  Ht _0  (1.575 m)  Wt 122 lb (55.339 kg)  BMI 22.31 kg/m2  SpO2 98%  Physical Exam  Constitutional: She is oriented to person, place, and time. She appears well-developed and well-nourished. No distress.  Neck: No tracheal deviation present.  Cardiovascular: Normal rate and regular rhythm.  Exam reveals no gallop and no friction rub.   No murmur heard. Pulmonary/Chest: Breath sounds normal. No respiratory distress. She has no wheezes. She has no rales.  Abdominal: Soft. Bowel sounds are normal. She exhibits no distension. There is no tenderness.  Neurological: She is alert and oriented to person, place, and time.  Skin: She is not diaphoretic.  Psychiatric:  Anxious    Imaging: Dg Bone Survey Met  12/10/2013   CLINICAL DATA:  Possible myeloma  EXAM: METASTATIC BONE SURVEY  COMPARISON:  None.  FINDINGS: The  lateral view of the skull shows a few poorly defined radiolucent areas in the calvarium. This could represent vascular lakes but followup is recommended.  The lateral view the cervical spine shows anterior fusion at C5-6 with normal alignment. No bony abnormality is noted.  No compression deformity of the thoracic spine is seen. Normal alignment is maintained.  The lumbar vertebrae are normal alignment with no compression deformity noted.  A view of the pelvis shows no radiolucent lesion. and IUD is noted in the mid pelvis. The SI joints are unremarkable.  Views of the left femur and lower leg show no radiolucent lesion.  Views the right femur and lower leg also show no radiolucent lesion.  Views of the left shoulder, left humerus, and left forearm show no abnormality.  Views of the right shoulder, humerus, and forearm show no abnormality.  A frontal chest x-ray shows the lungs to be clear. Mediastinal hilar contours are unremarkable. No bony abnormality is seen.  IMPRESSION: The only questionable abnormality are a few poorly defined radiolucent areas within the calvarium on the lateral view. These may be vascular in origin, but myelomatous lesions cannot be excluded.   Electronically Signed   By: Ivar Drape M.D.   On: 12/10/2013 14:39    Labs:  CBC:  Recent Labs  12/08/13 1558 12/18/13 0745  WBC 4.9 3.6*  HGB 13.9 13.2  HCT 41.7 38.1  PLT 277 221    COAGS:  Recent Labs  12/18/13 0745  INR 0.96  APTT 29    BMP:  Recent Labs  12/08/13 1558  NA 143  K 3.7  CO2 25  GLUCOSE 99  BUN 11.4  CALCIUM 9.8  CREATININE 0.8    LIVER FUNCTION TESTS:  Recent Labs  12/08/13 1558  BILITOT 0.33  AST 20  ALT 26  ALKPHOS 68  PROT 7.5  ALBUMIN 4.3    Assessment and Plan: IgA paraproteinemia  Scheduled today for image guided bone marrow biopsy with moderate sedation Patient has been NPO, labs  reviewed Risks and Benefits discussed with the patient. All of the patient's questions  were answered, patient is agreeable to proceed. Consent signed and in chart.   SignedHedy Jacob 12/18/2013, 9:01 AM

## 2013-12-19 DIAGNOSIS — J3081 Allergic rhinitis due to animal (cat) (dog) hair and dander: Secondary | ICD-10-CM | POA: Diagnosis not present

## 2013-12-22 ENCOUNTER — Telehealth: Payer: Self-pay | Admitting: Hematology

## 2013-12-22 ENCOUNTER — Ambulatory Visit (HOSPITAL_BASED_OUTPATIENT_CLINIC_OR_DEPARTMENT_OTHER): Payer: Medicare Other | Admitting: Hematology

## 2013-12-22 VITALS — BP 111/62 | HR 83 | Temp 98.7°F | Resp 18 | Ht 62.0 in | Wt 121.7 lb

## 2013-12-22 DIAGNOSIS — D892 Hypergammaglobulinemia, unspecified: Secondary | ICD-10-CM | POA: Diagnosis not present

## 2013-12-22 DIAGNOSIS — D472 Monoclonal gammopathy: Secondary | ICD-10-CM

## 2013-12-22 NOTE — Telephone Encounter (Signed)
Gave avs & cal for Jan 2016. °

## 2013-12-23 ENCOUNTER — Encounter: Payer: Self-pay | Admitting: Hematology

## 2013-12-23 DIAGNOSIS — D472 Monoclonal gammopathy: Secondary | ICD-10-CM | POA: Insufficient documentation
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
## 2013-12-24 DIAGNOSIS — J3081 Allergic rhinitis due to animal (cat) (dog) hair and dander: Secondary | ICD-10-CM | POA: Diagnosis not present

## 2013-12-24 DIAGNOSIS — J301 Allergic rhinitis due to pollen: Secondary | ICD-10-CM | POA: Diagnosis not present

## 2013-12-24 DIAGNOSIS — J3089 Other allergic rhinitis: Secondary | ICD-10-CM | POA: Diagnosis not present

## 2013-12-25 ENCOUNTER — Other Ambulatory Visit: Payer: Self-pay

## 2013-12-25 DIAGNOSIS — Z1231 Encounter for screening mammogram for malignant neoplasm of breast: Secondary | ICD-10-CM

## 2013-12-29 ENCOUNTER — Encounter: Payer: Self-pay | Admitting: Hematology

## 2013-12-29 DIAGNOSIS — G43909 Migraine, unspecified, not intractable, without status migrainosus: Secondary | ICD-10-CM | POA: Diagnosis not present

## 2013-12-29 DIAGNOSIS — M797 Fibromyalgia: Secondary | ICD-10-CM | POA: Diagnosis not present

## 2013-12-29 DIAGNOSIS — K589 Irritable bowel syndrome without diarrhea: Secondary | ICD-10-CM | POA: Diagnosis not present

## 2013-12-29 DIAGNOSIS — D472 Monoclonal gammopathy: Secondary | ICD-10-CM | POA: Diagnosis not present

## 2013-12-29 DIAGNOSIS — Z23 Encounter for immunization: Secondary | ICD-10-CM | POA: Diagnosis not present

## 2013-12-29 DIAGNOSIS — D72819 Decreased white blood cell count, unspecified: Secondary | ICD-10-CM | POA: Diagnosis not present

## 2013-12-29 DIAGNOSIS — J3081 Allergic rhinitis due to animal (cat) (dog) hair and dander: Secondary | ICD-10-CM | POA: Diagnosis not present

## 2013-12-29 DIAGNOSIS — J3089 Other allergic rhinitis: Secondary | ICD-10-CM | POA: Diagnosis not present

## 2013-12-29 DIAGNOSIS — R630 Anorexia: Secondary | ICD-10-CM | POA: Diagnosis not present

## 2013-12-29 DIAGNOSIS — F419 Anxiety disorder, unspecified: Secondary | ICD-10-CM | POA: Diagnosis not present

## 2013-12-29 DIAGNOSIS — F319 Bipolar disorder, unspecified: Secondary | ICD-10-CM | POA: Diagnosis not present

## 2013-12-29 DIAGNOSIS — J301 Allergic rhinitis due to pollen: Secondary | ICD-10-CM | POA: Diagnosis not present

## 2013-12-29 LAB — TISSUE HYBRIDIZATION (BONE MARROW)-NCBH

## 2013-12-29 LAB — CHROMOSOME ANALYSIS, BONE MARROW

## 2014-01-05 DIAGNOSIS — G4701 Insomnia due to medical condition: Secondary | ICD-10-CM | POA: Diagnosis not present

## 2014-01-05 DIAGNOSIS — M19041 Primary osteoarthritis, right hand: Secondary | ICD-10-CM | POA: Diagnosis not present

## 2014-01-05 DIAGNOSIS — M19071 Primary osteoarthritis, right ankle and foot: Secondary | ICD-10-CM | POA: Diagnosis not present

## 2014-01-05 DIAGNOSIS — J301 Allergic rhinitis due to pollen: Secondary | ICD-10-CM | POA: Diagnosis not present

## 2014-01-05 DIAGNOSIS — J3081 Allergic rhinitis due to animal (cat) (dog) hair and dander: Secondary | ICD-10-CM | POA: Diagnosis not present

## 2014-01-05 DIAGNOSIS — M797 Fibromyalgia: Secondary | ICD-10-CM | POA: Diagnosis not present

## 2014-01-05 DIAGNOSIS — J3089 Other allergic rhinitis: Secondary | ICD-10-CM | POA: Diagnosis not present

## 2014-01-07 ENCOUNTER — Encounter (HOSPITAL_COMMUNITY): Payer: Self-pay

## 2014-01-07 ENCOUNTER — Telehealth: Payer: Self-pay

## 2014-01-07 NOTE — Telephone Encounter (Signed)
Called pt that her FISH and cytogenetics were normal

## 2014-01-08 DIAGNOSIS — J3081 Allergic rhinitis due to animal (cat) (dog) hair and dander: Secondary | ICD-10-CM | POA: Diagnosis not present

## 2014-01-08 DIAGNOSIS — F3113 Bipolar disorder, current episode manic without psychotic features, severe: Secondary | ICD-10-CM | POA: Diagnosis not present

## 2014-01-08 DIAGNOSIS — J3089 Other allergic rhinitis: Secondary | ICD-10-CM | POA: Diagnosis not present

## 2014-01-08 DIAGNOSIS — J301 Allergic rhinitis due to pollen: Secondary | ICD-10-CM | POA: Diagnosis not present

## 2014-01-09 DIAGNOSIS — F3113 Bipolar disorder, current episode manic without psychotic features, severe: Secondary | ICD-10-CM | POA: Diagnosis not present

## 2014-01-13 DIAGNOSIS — J301 Allergic rhinitis due to pollen: Secondary | ICD-10-CM | POA: Diagnosis not present

## 2014-01-13 DIAGNOSIS — J4530 Mild persistent asthma, uncomplicated: Secondary | ICD-10-CM | POA: Diagnosis not present

## 2014-01-13 DIAGNOSIS — J3089 Other allergic rhinitis: Secondary | ICD-10-CM | POA: Diagnosis not present

## 2014-01-13 DIAGNOSIS — H1045 Other chronic allergic conjunctivitis: Secondary | ICD-10-CM | POA: Diagnosis not present

## 2014-01-13 DIAGNOSIS — J3081 Allergic rhinitis due to animal (cat) (dog) hair and dander: Secondary | ICD-10-CM | POA: Diagnosis not present

## 2014-01-21 DIAGNOSIS — J3081 Allergic rhinitis due to animal (cat) (dog) hair and dander: Secondary | ICD-10-CM | POA: Diagnosis not present

## 2014-01-21 DIAGNOSIS — J301 Allergic rhinitis due to pollen: Secondary | ICD-10-CM | POA: Diagnosis not present

## 2014-01-21 DIAGNOSIS — J3089 Other allergic rhinitis: Secondary | ICD-10-CM | POA: Diagnosis not present

## 2014-01-27 DIAGNOSIS — G43909 Migraine, unspecified, not intractable, without status migrainosus: Secondary | ICD-10-CM | POA: Diagnosis not present

## 2014-01-27 DIAGNOSIS — J45909 Unspecified asthma, uncomplicated: Secondary | ICD-10-CM | POA: Diagnosis not present

## 2014-01-27 DIAGNOSIS — M797 Fibromyalgia: Secondary | ICD-10-CM | POA: Diagnosis not present

## 2014-01-27 DIAGNOSIS — K589 Irritable bowel syndrome without diarrhea: Secondary | ICD-10-CM | POA: Diagnosis not present

## 2014-01-27 DIAGNOSIS — F319 Bipolar disorder, unspecified: Secondary | ICD-10-CM | POA: Diagnosis not present

## 2014-01-27 DIAGNOSIS — G629 Polyneuropathy, unspecified: Secondary | ICD-10-CM | POA: Diagnosis not present

## 2014-01-27 DIAGNOSIS — D72819 Decreased white blood cell count, unspecified: Secondary | ICD-10-CM | POA: Diagnosis not present

## 2014-01-27 DIAGNOSIS — D472 Monoclonal gammopathy: Secondary | ICD-10-CM | POA: Diagnosis not present

## 2014-01-29 DIAGNOSIS — F3113 Bipolar disorder, current episode manic without psychotic features, severe: Secondary | ICD-10-CM | POA: Diagnosis not present

## 2014-02-02 ENCOUNTER — Ambulatory Visit
Admission: RE | Admit: 2014-02-02 | Discharge: 2014-02-02 | Disposition: A | Discharge status: Home / Self Care | Payer: Medicare Other | Source: Ambulatory Visit

## 2014-02-02 DIAGNOSIS — Z1231 Encounter for screening mammogram for malignant neoplasm of breast: Secondary | ICD-10-CM | POA: Diagnosis not present

## 2014-02-06 DIAGNOSIS — J3089 Other allergic rhinitis: Secondary | ICD-10-CM | POA: Diagnosis not present

## 2014-02-06 DIAGNOSIS — J3081 Allergic rhinitis due to animal (cat) (dog) hair and dander: Secondary | ICD-10-CM | POA: Diagnosis not present

## 2014-02-06 DIAGNOSIS — J301 Allergic rhinitis due to pollen: Secondary | ICD-10-CM | POA: Diagnosis not present

## 2014-02-12 DIAGNOSIS — F3113 Bipolar disorder, current episode manic without psychotic features, severe: Secondary | ICD-10-CM | POA: Diagnosis not present

## 2014-02-13 DIAGNOSIS — J3081 Allergic rhinitis due to animal (cat) (dog) hair and dander: Secondary | ICD-10-CM | POA: Diagnosis not present

## 2014-02-13 DIAGNOSIS — J3089 Other allergic rhinitis: Secondary | ICD-10-CM | POA: Diagnosis not present

## 2014-02-13 DIAGNOSIS — J301 Allergic rhinitis due to pollen: Secondary | ICD-10-CM | POA: Diagnosis not present

## 2014-02-18 DIAGNOSIS — J3081 Allergic rhinitis due to animal (cat) (dog) hair and dander: Secondary | ICD-10-CM | POA: Diagnosis not present

## 2014-02-18 DIAGNOSIS — J301 Allergic rhinitis due to pollen: Secondary | ICD-10-CM | POA: Diagnosis not present

## 2014-02-18 DIAGNOSIS — J3089 Other allergic rhinitis: Secondary | ICD-10-CM | POA: Diagnosis not present

## 2014-03-03 DIAGNOSIS — G43909 Migraine, unspecified, not intractable, without status migrainosus: Secondary | ICD-10-CM | POA: Diagnosis not present

## 2014-03-03 DIAGNOSIS — M797 Fibromyalgia: Secondary | ICD-10-CM | POA: Diagnosis not present

## 2014-03-03 DIAGNOSIS — F319 Bipolar disorder, unspecified: Secondary | ICD-10-CM | POA: Diagnosis not present

## 2014-03-03 DIAGNOSIS — J45909 Unspecified asthma, uncomplicated: Secondary | ICD-10-CM | POA: Diagnosis not present

## 2014-03-03 DIAGNOSIS — L600 Ingrowing nail: Secondary | ICD-10-CM | POA: Diagnosis not present

## 2014-03-03 DIAGNOSIS — D72819 Decreased white blood cell count, unspecified: Secondary | ICD-10-CM | POA: Diagnosis not present

## 2014-03-03 DIAGNOSIS — H6120 Impacted cerumen, unspecified ear: Secondary | ICD-10-CM | POA: Diagnosis not present

## 2014-03-03 DIAGNOSIS — K589 Irritable bowel syndrome without diarrhea: Secondary | ICD-10-CM | POA: Diagnosis not present

## 2014-03-03 DIAGNOSIS — G629 Polyneuropathy, unspecified: Secondary | ICD-10-CM | POA: Diagnosis not present

## 2014-03-03 DIAGNOSIS — D472 Monoclonal gammopathy: Secondary | ICD-10-CM | POA: Diagnosis not present

## 2014-03-04 DIAGNOSIS — F3113 Bipolar disorder, current episode manic without psychotic features, severe: Secondary | ICD-10-CM | POA: Diagnosis not present

## 2014-03-05 DIAGNOSIS — J3089 Other allergic rhinitis: Secondary | ICD-10-CM | POA: Diagnosis not present

## 2014-03-05 DIAGNOSIS — J3081 Allergic rhinitis due to animal (cat) (dog) hair and dander: Secondary | ICD-10-CM | POA: Diagnosis not present

## 2014-03-05 DIAGNOSIS — J301 Allergic rhinitis due to pollen: Secondary | ICD-10-CM | POA: Diagnosis not present

## 2014-03-10 DIAGNOSIS — J329 Chronic sinusitis, unspecified: Secondary | ICD-10-CM | POA: Diagnosis not present

## 2014-03-14 ENCOUNTER — Telehealth: Payer: Self-pay | Admitting: Internal Medicine

## 2014-03-14 NOTE — Telephone Encounter (Signed)
, °

## 2014-03-19 DIAGNOSIS — J3081 Allergic rhinitis due to animal (cat) (dog) hair and dander: Secondary | ICD-10-CM | POA: Diagnosis not present

## 2014-03-19 DIAGNOSIS — J3089 Other allergic rhinitis: Secondary | ICD-10-CM | POA: Diagnosis not present

## 2014-03-19 DIAGNOSIS — J301 Allergic rhinitis due to pollen: Secondary | ICD-10-CM | POA: Diagnosis not present

## 2014-03-24 ENCOUNTER — Other Ambulatory Visit: Payer: Medicare Other

## 2014-03-24 ENCOUNTER — Ambulatory Visit: Payer: Medicare Other

## 2014-04-01 DIAGNOSIS — F3113 Bipolar disorder, current episode manic without psychotic features, severe: Secondary | ICD-10-CM | POA: Diagnosis not present

## 2014-04-03 DIAGNOSIS — J3081 Allergic rhinitis due to animal (cat) (dog) hair and dander: Secondary | ICD-10-CM | POA: Diagnosis not present

## 2014-04-03 DIAGNOSIS — J301 Allergic rhinitis due to pollen: Secondary | ICD-10-CM | POA: Diagnosis not present

## 2014-04-03 DIAGNOSIS — J3089 Other allergic rhinitis: Secondary | ICD-10-CM | POA: Diagnosis not present

## 2014-04-06 ENCOUNTER — Other Ambulatory Visit: Payer: Self-pay | Admitting: *Deleted

## 2014-04-06 DIAGNOSIS — D472 Monoclonal gammopathy: Secondary | ICD-10-CM

## 2014-04-07 ENCOUNTER — Encounter: Payer: Self-pay | Admitting: Internal Medicine

## 2014-04-07 ENCOUNTER — Ambulatory Visit (HOSPITAL_BASED_OUTPATIENT_CLINIC_OR_DEPARTMENT_OTHER): Payer: Medicare Other | Admitting: Internal Medicine

## 2014-04-07 ENCOUNTER — Telehealth: Payer: Self-pay | Admitting: Internal Medicine

## 2014-04-07 ENCOUNTER — Ambulatory Visit (HOSPITAL_BASED_OUTPATIENT_CLINIC_OR_DEPARTMENT_OTHER): Payer: Medicare Other

## 2014-04-07 VITALS — BP 114/60 | HR 71 | Temp 98.5°F | Resp 18 | Ht 62.0 in | Wt 124.2 lb

## 2014-04-07 DIAGNOSIS — D472 Monoclonal gammopathy: Secondary | ICD-10-CM

## 2014-04-07 DIAGNOSIS — M791 Myalgia: Secondary | ICD-10-CM | POA: Diagnosis not present

## 2014-04-07 LAB — COMPREHENSIVE METABOLIC PANEL (CC13)
ALT: 58 U/L — ABNORMAL HIGH (ref 0–55)
AST: 37 U/L — ABNORMAL HIGH (ref 5–34)
Albumin: 4.4 g/dL (ref 3.5–5.0)
Alkaline Phosphatase: 85 U/L (ref 40–150)
Anion Gap: 12 mEq/L — ABNORMAL HIGH (ref 3–11)
BUN: 15.9 mg/dL (ref 7.0–26.0)
CO2: 21 mEq/L — ABNORMAL LOW (ref 22–29)
Calcium: 9.3 mg/dL (ref 8.4–10.4)
Chloride: 107 mEq/L (ref 98–109)
Creatinine: 0.8 mg/dL (ref 0.6–1.1)
EGFR: 88 mL/min/{1.73_m2} — ABNORMAL LOW (ref >90)
Glucose: 90 mg/dl (ref 70–140)
Potassium: 3.7 mEq/L (ref 3.5–5.1)
Sodium: 140 mEq/L (ref 136–145)
Total Bilirubin: 0.38 mg/dL (ref 0.20–1.20)
Total Protein: 7.1 g/dL (ref 6.4–8.3)

## 2014-04-07 LAB — CBC WITH DIFFERENTIAL/PLATELET
BASO%: 0.5 % (ref 0.0–2.0)
Basophils Absolute: 0 10*3/uL (ref 0.0–0.1)
EOS%: 4.5 % (ref 0.0–7.0)
Eosinophils Absolute: 0.2 10*3/uL (ref 0.0–0.5)
HCT: 39.9 % (ref 34.8–46.6)
HGB: 13.7 g/dL (ref 11.6–15.9)
LYMPH%: 38.3 % (ref 14.0–49.7)
MCH: 31.1 pg (ref 25.1–34.0)
MCHC: 34.3 g/dL (ref 31.5–36.0)
MCV: 90.5 fL (ref 79.5–101.0)
MONO#: 0.5 10*3/uL (ref 0.1–0.9)
MONO%: 11.1 % (ref 0.0–14.0)
NEUT#: 2 10*3/uL (ref 1.5–6.5)
NEUT%: 45.6 % (ref 38.4–76.8)
Platelets: 222 10*3/uL (ref 145–400)
RBC: 4.41 10*6/uL (ref 3.70–5.45)
RDW: 12.3 % (ref 11.2–14.5)
WBC: 4.4 10*3/uL (ref 3.9–10.3)
lymph#: 1.7 10*3/uL (ref 0.9–3.3)

## 2014-04-07 NOTE — Patient Instructions (Signed)
Smoking Cessation, Tips for Success  If you are ready to quit smoking, congratulations! You have chosen to help yourself be healthier. Cigarettes bring nicotine, tar, carbon monoxide, and other irritants into your body. Your lungs, heart, and blood vessels will be able to work better without these poisons. There are many different ways to quit smoking. Nicotine gum, nicotine patches, a nicotine inhaler, or nicotine nasal spray can help with physical craving. Hypnosis, support groups, and medicines help break the habit of smoking.  WHAT THINGS CAN I DO TO MAKE QUITTING EASIER?   Here are some tips to help you quit for good:  · Pick a date when you will quit smoking completely. Tell all of your friends and family about your plan to quit on that date.  · Do not try to slowly cut down on the number of cigarettes you are smoking. Pick a quit date and quit smoking completely starting on that day.  · Throw away all cigarettes.    · Clean and remove all ashtrays from your home, work, and car.  · On a card, write down your reasons for quitting. Carry the card with you and read it when you get the urge to smoke.  · Cleanse your body of nicotine. Drink enough water and fluids to keep your urine clear or pale yellow. Do this after quitting to flush the nicotine from your body.  · Learn to predict your moods. Do not let a bad situation be your excuse to have a cigarette. Some situations in your life might tempt you into wanting a cigarette.  · Never have "just one" cigarette. It leads to wanting another and another. Remind yourself of your decision to quit.  · Change habits associated with smoking. If you smoked while driving or when feeling stressed, try other activities to replace smoking. Stand up when drinking your coffee. Brush your teeth after eating. Sit in a different chair when you read the paper. Avoid alcohol while trying to quit, and try to drink fewer caffeinated beverages. Alcohol and caffeine may urge you to  smoke.  · Avoid foods and drinks that can trigger a desire to smoke, such as sugary or spicy foods and alcohol.  · Ask people who smoke not to smoke around you.  · Have something planned to do right after eating or having a cup of coffee. For example, plan to take a walk or exercise.  · Try a relaxation exercise to calm you down and decrease your stress. Remember, you may be tense and nervous for the first 2 weeks after you quit, but this will pass.  · Find new activities to keep your hands busy. Play with a pen, coin, or rubber band. Doodle or draw things on paper.  · Brush your teeth right after eating. This will help cut down on the craving for the taste of tobacco after meals. You can also try mouthwash.    · Use oral substitutes in place of cigarettes. Try using lemon drops, carrots, cinnamon sticks, or chewing gum. Keep them handy so they are available when you have the urge to smoke.  · When you have the urge to smoke, try deep breathing.  · Designate your home as a nonsmoking area.  · If you are a heavy smoker, ask your health care provider about a prescription for nicotine chewing gum. It can ease your withdrawal from nicotine.  · Reward yourself. Set aside the cigarette money you save and buy yourself something nice.  · Look for   support from others. Join a support group or smoking cessation program. Ask someone at home or at work to help you with your plan to quit smoking.  · Always ask yourself, "Do I need this cigarette or is this just a reflex?" Tell yourself, "Today, I choose not to smoke," or "I do not want to smoke." You are reminding yourself of your decision to quit.  · Do not replace cigarette smoking with electronic cigarettes (commonly called e-cigarettes). The safety of e-cigarettes is unknown, and some may contain harmful chemicals.  · If you relapse, do not give up! Plan ahead and think about what you will do the next time you get the urge to smoke.  HOW WILL I FEEL WHEN I QUIT SMOKING?  You  may have symptoms of withdrawal because your body is used to nicotine (the addictive substance in cigarettes). You may crave cigarettes, be irritable, feel very hungry, cough often, get headaches, or have difficulty concentrating. The withdrawal symptoms are only temporary. They are strongest when you first quit but will go away within 10-14 days. When withdrawal symptoms occur, stay in control. Think about your reasons for quitting. Remind yourself that these are signs that your body is healing and getting used to being without cigarettes. Remember that withdrawal symptoms are easier to treat than the major diseases that smoking can cause.   Even after the withdrawal is over, expect periodic urges to smoke. However, these cravings are generally short lived and will go away whether you smoke or not. Do not smoke!  WHAT RESOURCES ARE AVAILABLE TO HELP ME QUIT SMOKING?  Your health care provider can direct you to community resources or hospitals for support, which may include:  · Group support.  · Education.  · Hypnosis.  · Therapy.  Document Released: 11/12/2003 Document Revised: 06/30/2013 Document Reviewed: 08/01/2012  [employer]® Patient Information ©2015 [employer]. This information is not intended to replace advice given to you by your health care provider. Make sure you discuss any questions you have with your health care provider.

## 2014-04-07 NOTE — Progress Notes (Signed)
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 2) fibromyalgia. 3) bipolar disorder  PRIOR THERAPY: None  CURRENT THERAPY: Observation  INTERVAL HISTORY: Danielle Bruce 54 y.o. female returns to the clinic today for follow-up visit and to establish care with me. She is a former patient of Dr. Lona Kettle. The patient was diagnosed with monoclonal gammopathy of undetermined significance in September 2015. She had a bone marrow biopsy and aspirate performed at that time that showed 8% plasma cells. She does not have any evidence of end organ disease specifically no hypercalcemia, renal dysfunction or anemia. She has been on observation and she is here today for reevaluation of her condition. She denied having any significant fever or chills, no nausea or vomiting. She denied having any significant chest pain, shortness of breath, cough or hemoptysis. The patient continues to have fatigue and aching pain from her fibromyalgia. She is followed by Dr. Estanislado Pandy for her rheumatologic disorder. She is currently on Neurontin 300 mg by mouth twice a day. The patient had repeat myeloma panel performed earlier today and she is here for evaluation and discussion of her condition.  MEDICAL HISTORY: Past Medical History  Diagnosis Date  . GERD (gastroesophageal reflux disease)   . 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 Migraines   . Bipolar 2 disorder   . IBS (irritable bowel syndrome)   . Constipation   . ASCUS (atypical squamous cells of undetermined significance) on Pap smear 05/06/05    NEG HIGH RISK HPV--C&B BIOPSY BENIGN 12/2005  . Anxiety   . 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    cytology negative  . Fibromyalgia   . 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    Bone  marrow biopsy showes 8% plasma cells IgA Lambda    ALLERGIES:  is allergic to sulfa antibiotics; flagyl; lactose intolerance (gi); and morphine and related.  MEDICATIONS:  Current Outpatient Prescriptions  Medication Sig Dispense Refill  . albuterol (PROVENTIL HFA;VENTOLIN HFA) 108 (90 BASE) MCG/ACT inhaler Inhale 2 puffs into the lungs every 6 (six) hours as needed for wheezing or shortness of breath.     . Ascorbic Acid (VITAMIN C) 1000 MG tablet Take 1,000 mg by mouth at bedtime.    Marland Kitchen azelastine (ASTELIN) 0.1 % nasal spray Place 2 sprays into both nostrils 2 (two) times daily. Use in each nostril as directed    . beclomethasone (QVAR) 40 MCG/ACT inhaler Inhale 2 puffs into the lungs 2 (two) times daily.    Marland Kitchen BEPREVE 1.5 % SOLN Place 1 drop into both eyes 2 (two) times daily.     . diphenhydrAMINE (BENADRYL) 25 mg capsule Take 50 mg by mouth every 6 (six) hours as needed for itching or allergies.     Marland Kitchen docusate sodium (COLACE) 100 MG capsule Take 100 mg by mouth every other day. Alternates with the senokot    . esomeprazole (NEXIUM) 40 MG capsule Take 40 mg by mouth 2 (two) times daily.    . fexofenadine (ALLEGRA) 180 MG tablet Take 180 mg by mouth daily.    . fluvoxaMINE (LUVOX) 25 MG tablet Take 50 mg by mouth every morning.     . gabapentin (NEURONTIN) 300 MG capsule Take 300 mg by mouth 2 (two) times daily.     . hyoscyamine (  LEVSIN SL) 0.125 MG SL tablet Take 0.125 mg by mouth every 6 (six) hours as needed for cramping.     . lamoTRIgine (LAMICTAL) 200 MG tablet Take 200 mg by mouth at bedtime.    Marland Kitchen levonorgestrel (MIRENA) 20 MCG/24HR IUD 1 each by Intrauterine route once.    Marland Kitchen LORazepam (ATIVAN) 1 MG tablet Take 1 mg by mouth every 8 (eight) hours as needed for anxiety. (takes 2 mg when having a medical procedure.)    . Magnesium 500 MG TABS Take 1 tablet by mouth at bedtime.    . meclizine (ANTIVERT) 25 MG tablet Take 25 mg by mouth 3 (three) times daily as needed for dizziness.      . Melatonin 10 MG CAPS Take 1 tablet by mouth at bedtime.     . methylphenidate (DAYTRANA) 10 mg/9hr patch Place 1 patch onto the skin as needed. wear patch for 9 hours only each day    . montelukast (SINGULAIR) 10 MG tablet Take 10 mg by mouth at bedtime.    . Multiple Vitamin (MULTIVITAMIN WITH MINERALS) TABS Take 1 tablet by mouth at bedtime.    . Omega-3 Fatty Acids (OMEGA 3 PO) Take 1 tablet by mouth at bedtime.    Marland Kitchen OVER THE COUNTER MEDICATION Take 1 tablet by mouth daily with supper. Calcium-Vitamin D- K2 -600-100-90 mg    . oxyCODONE-acetaminophen (PERCOCET/ROXICET) 5-325 MG per tablet Take 1-2 tablets by mouth every 6 (six) hours as needed for moderate pain or severe pain.    . polyethylene glycol (MIRALAX / GLYCOLAX) packet Take 17 g by mouth at bedtime.     . pramipexole (MIRAPEX) 0.125 MG tablet Take 0.125 mg by mouth daily.  1  . PRESCRIPTION MEDICATION Inject 1 each into the skin 2 (two) times a week. Allergy shot for Cats.    Marland Kitchen PRESCRIPTION MEDICATION Inject 1 each into the skin 2 (two) times a week. Combination Allergy shot.    . promethazine (PHENERGAN) 25 MG tablet Take 25 mg by mouth every 6 (six) hours as needed for nausea.     . risperiDONE (RISPERDAL) 0.25 MG tablet Take 0.25 mg by mouth. As needed for an anxiety attack    . senna (SENOKOT) 8.6 MG TABS Take 1 tablet by mouth every other day.     . topiramate (TOPAMAX) 25 MG tablet Take 75 mg by mouth daily.     . traZODone (DESYREL) 50 MG tablet Take 50 mg by mouth at bedtime.       No current facility-administered medications for this visit.    SURGICAL HISTORY:  Past Surgical History  Procedure Laterality Date  . Hemorrhoid surgery  1993    x3  . Cesarean section  [id number]  . Shoulder surgery  2007/2008  . Spine surgery  2010    cervical  . Dilation and curettage of uterus    . Pelvic laparoscopy    . Intrauterine device insertion  01/04/2010    MIRENA  . Laparoscopy N/A 10/16/2012    Procedure: LAPAROSCOPY  DIAGNOSTIC;  Surgeon: Joyice Faster. Cornett, MD;  Location: [facility];  Service: General;  Laterality: N/A;  . Cholecystectomy N/A 10/16/2012    Procedure: LAPAROSCOPIC CHOLECYSTECTOMY;  Surgeon: Joyice Faster. Cornett, MD;  Location: [facility];  Service: General;  Laterality: N/A;  . Laparoscopic lysis of adhesions N/A 10/16/2012    Procedure: LAPAROSCOPIC LYSIS OF ADHESIONS;  Surgeon: Joyice Faster. Cornett, MD;  Location: [facility];  Service: General;  Laterality: N/A;  . Bone  marrow biopsy Left 12/18/2013    Plasma cell dyscrasia 8% population of plasma cells    REVIEW OF SYSTEMS:  Constitutional: positive for fatigue Eyes: negative Ears, nose, mouth, throat, and face: negative Respiratory: negative Cardiovascular: negative Gastrointestinal: negative Genitourinary:negative Integument/breast: negative Hematologic/lymphatic: negative Musculoskeletal:positive for arthralgias and myalgias Neurological: negative Behavioral/Psych: positive for anxiety and depression Endocrine: negative Allergic/Immunologic: negative   PHYSICAL EXAMINATION: General appearance: alert, cooperative, fatigued and no distress Head: Normocephalic, without obvious abnormality, atraumatic Neck: no adenopathy, no JVD, supple, symmetrical, trachea midline and thyroid not enlarged, symmetric, no tenderness/mass/nodules Lymph nodes: Cervical, supraclavicular, and axillary nodes normal. Resp: clear to auscultation bilaterally Back: symmetric, no curvature. ROM normal. No CVA tenderness. Cardio: regular rate and rhythm, S1, S2 normal, no murmur, click, rub or gallop GI: soft, non-tender; bowel sounds normal; no masses,  no organomegaly Extremities: extremities normal, atraumatic, no cyanosis or edema Neurologic: Alert and oriented X 3, normal strength and tone. Normal symmetric reflexes. Normal coordination and gait  ECOG PERFORMANCE STATUS: 1 - Symptomatic but completely ambulatory  Blood pressure 114/60, pulse 71, temperature 98.5 F  (36.9 C), temperature source Oral, resp. rate 18, height _0  (1.575 m), weight 124 lb 3.2 oz (56.337 kg).  LABORATORY DATA: Lab Results  Component Value Date   WBC 4.4 04/07/2014   HGB 13.7 04/07/2014   HCT 39.9 04/07/2014   MCV 90.5 04/07/2014   PLT 222 04/07/2014      Chemistry      Component Value Date/Time   NA 140 04/07/2014 1242   NA 138 11/18/2012 2201   K 3.7 04/07/2014 1242   K 3.3* 11/18/2012 2201   CL 104 11/18/2012 2201   CO2 21* 04/07/2014 1242   CO2 23 10/11/2012 1309   BUN 15.9 04/07/2014 1242   BUN 13 11/18/2012 2201   CREATININE 0.8 04/07/2014 1242   CREATININE 0.50 11/18/2012 2201      Component Value Date/Time   CALCIUM 9.3 04/07/2014 1242   CALCIUM 9.7 10/11/2012 1309   ALKPHOS 85 04/07/2014 1242   ALKPHOS 57 10/11/2012 1309   AST 37* 04/07/2014 1242   AST 32 10/11/2012 1309   ALT 58* 04/07/2014 1242   ALT 35 10/11/2012 1309   BILITOT 0.38 04/07/2014 1242   BILITOT 0.5 10/11/2012 1309       RADIOGRAPHIC STUDIES: No results found.  ASSESSMENT AND PLAN: This is a very pleasant 54 years old white female with history of monoclonal gammopathy of undetermined significance in addition to other history of fibromyalgia, bipolar disorder, anxiety and depression as well as irritable bowel syndrome. The patient is doing fine today except for the myalgia. CBC and comprehensive metabolic panel showed no significant end organ failure, no significant hypercalcemia, renal insufficiency or anemia. The myeloma panel is still pending. We will call the patient with the result if there is any concerning abnormality. I recommended for the patient to continue on observation with repeat myeloma panel in 3 months. The patient had several questions about her condition and prognosis and the concern about conversion to multiple myeloma. I had a lengthy discussion with the patient about her condition and I answered her question to her satisfaction. She was advised to call  immediately if she has any concerning symptoms in the interval.  The patient voices understanding of current disease status and treatment options and is in agreement with the current care plan.  All questions were answered. The patient knows to call the clinic with any problems, questions or concerns. We can  certainly see the patient much sooner if necessary.  I spent 15 minutes counseling the patient face to face. The total time spent in the appointment was 25 minutes.  Disclaimer: This note was dictated with voice recognition software. Similar sounding words can inadvertently be transcribed and may not be corrected upon review.

## 2014-04-07 NOTE — Telephone Encounter (Signed)
Gave avs & calendar for May. °

## 2014-04-09 DIAGNOSIS — F319 Bipolar disorder, unspecified: Secondary | ICD-10-CM | POA: Diagnosis not present

## 2014-04-09 DIAGNOSIS — G629 Polyneuropathy, unspecified: Secondary | ICD-10-CM | POA: Diagnosis not present

## 2014-04-09 DIAGNOSIS — K589 Irritable bowel syndrome without diarrhea: Secondary | ICD-10-CM | POA: Diagnosis not present

## 2014-04-09 DIAGNOSIS — G43909 Migraine, unspecified, not intractable, without status migrainosus: Secondary | ICD-10-CM | POA: Diagnosis not present

## 2014-04-09 DIAGNOSIS — M797 Fibromyalgia: Secondary | ICD-10-CM | POA: Diagnosis not present

## 2014-04-09 DIAGNOSIS — R748 Abnormal levels of other serum enzymes: Secondary | ICD-10-CM | POA: Diagnosis not present

## 2014-04-09 DIAGNOSIS — D472 Monoclonal gammopathy: Secondary | ICD-10-CM | POA: Diagnosis not present

## 2014-04-09 DIAGNOSIS — D72819 Decreased white blood cell count, unspecified: Secondary | ICD-10-CM | POA: Diagnosis not present

## 2014-04-09 DIAGNOSIS — J45909 Unspecified asthma, uncomplicated: Secondary | ICD-10-CM | POA: Diagnosis not present

## 2014-04-09 LAB — IGG, IGA, IGM
IgA: 375 mg/dL (ref 69–380)
IgG (Immunoglobin G), Serum: 596 mg/dL — ABNORMAL LOW (ref 690–1700)
IgM, Serum: 43 mg/dL — ABNORMAL LOW (ref 52–322)

## 2014-04-09 LAB — KAPPA/LAMBDA LIGHT CHAINS
Kappa free light chain: 1.15 mg/dL (ref 0.33–1.94)
Kappa:Lambda Ratio: 1.24 (ref 0.26–1.65)
Lambda Free Lght Chn: 0.93 mg/dL (ref 0.57–2.63)

## 2014-04-09 LAB — PROTEIN ELECTROPHORESIS, SERUM, WITH REFLEX
Albumin ELP: 66 % (ref 55.8–66.1)
Alpha-1-Globulin: 3.9 % (ref 2.9–4.9)
Alpha-2-Globulin: 9.6 % (ref 7.1–11.8)
Beta 2: 7.3 % — ABNORMAL HIGH (ref 3.2–6.5)
Beta Globulin: 6.2 % (ref 4.7–7.2)
Gamma Globulin: 7 % — ABNORMAL LOW (ref 11.1–18.8)
M-Spike, %: 0.31 g/dL
Total Protein, Serum Electrophoresis: 7.1 g/dL (ref 6.0–8.3)

## 2014-04-09 LAB — IFE INTERPRETATION

## 2014-04-19 DIAGNOSIS — R229 Localized swelling, mass and lump, unspecified: Secondary | ICD-10-CM | POA: Diagnosis not present

## 2014-04-21 DIAGNOSIS — J301 Allergic rhinitis due to pollen: Secondary | ICD-10-CM | POA: Diagnosis not present

## 2014-04-21 DIAGNOSIS — J3089 Other allergic rhinitis: Secondary | ICD-10-CM | POA: Diagnosis not present

## 2014-04-21 DIAGNOSIS — J3081 Allergic rhinitis due to animal (cat) (dog) hair and dander: Secondary | ICD-10-CM | POA: Diagnosis not present

## 2014-04-23 DIAGNOSIS — D179 Benign lipomatous neoplasm, unspecified: Secondary | ICD-10-CM | POA: Diagnosis not present

## 2014-04-23 DIAGNOSIS — R748 Abnormal levels of other serum enzymes: Secondary | ICD-10-CM | POA: Diagnosis not present

## 2014-04-23 DIAGNOSIS — Z1159 Encounter for screening for other viral diseases: Secondary | ICD-10-CM | POA: Diagnosis not present

## 2014-05-08 DIAGNOSIS — J301 Allergic rhinitis due to pollen: Secondary | ICD-10-CM | POA: Diagnosis not present

## 2014-05-08 DIAGNOSIS — J3089 Other allergic rhinitis: Secondary | ICD-10-CM | POA: Diagnosis not present

## 2014-05-08 DIAGNOSIS — J3081 Allergic rhinitis due to animal (cat) (dog) hair and dander: Secondary | ICD-10-CM | POA: Diagnosis not present

## 2014-05-13 DIAGNOSIS — F3113 Bipolar disorder, current episode manic without psychotic features, severe: Secondary | ICD-10-CM | POA: Diagnosis not present

## 2014-05-15 DIAGNOSIS — J3081 Allergic rhinitis due to animal (cat) (dog) hair and dander: Secondary | ICD-10-CM | POA: Diagnosis not present

## 2014-05-15 DIAGNOSIS — R102 Pelvic and perineal pain: Secondary | ICD-10-CM | POA: Diagnosis not present

## 2014-05-15 DIAGNOSIS — J3089 Other allergic rhinitis: Secondary | ICD-10-CM | POA: Diagnosis not present

## 2014-05-15 DIAGNOSIS — J301 Allergic rhinitis due to pollen: Secondary | ICD-10-CM | POA: Diagnosis not present

## 2014-05-27 DIAGNOSIS — J301 Allergic rhinitis due to pollen: Secondary | ICD-10-CM | POA: Diagnosis not present

## 2014-05-27 DIAGNOSIS — J3089 Other allergic rhinitis: Secondary | ICD-10-CM | POA: Diagnosis not present

## 2014-06-11 DIAGNOSIS — R195 Other fecal abnormalities: Secondary | ICD-10-CM | POA: Diagnosis not present

## 2014-06-11 DIAGNOSIS — R140 Abdominal distension (gaseous): Secondary | ICD-10-CM | POA: Diagnosis not present

## 2014-06-11 DIAGNOSIS — R1084 Generalized abdominal pain: Secondary | ICD-10-CM | POA: Diagnosis not present

## 2014-06-12 DIAGNOSIS — J3081 Allergic rhinitis due to animal (cat) (dog) hair and dander: Secondary | ICD-10-CM | POA: Diagnosis not present

## 2014-06-12 DIAGNOSIS — J301 Allergic rhinitis due to pollen: Secondary | ICD-10-CM | POA: Diagnosis not present

## 2014-06-12 DIAGNOSIS — J3089 Other allergic rhinitis: Secondary | ICD-10-CM | POA: Diagnosis not present

## 2014-06-15 DIAGNOSIS — K589 Irritable bowel syndrome without diarrhea: Secondary | ICD-10-CM | POA: Diagnosis not present

## 2014-06-15 DIAGNOSIS — K219 Gastro-esophageal reflux disease without esophagitis: Secondary | ICD-10-CM | POA: Diagnosis not present

## 2014-06-17 DIAGNOSIS — H1011 Acute atopic conjunctivitis, right eye: Secondary | ICD-10-CM | POA: Diagnosis not present

## 2014-06-19 DIAGNOSIS — J069 Acute upper respiratory infection, unspecified: Secondary | ICD-10-CM | POA: Diagnosis not present

## 2014-06-19 DIAGNOSIS — J301 Allergic rhinitis due to pollen: Secondary | ICD-10-CM | POA: Diagnosis not present

## 2014-06-24 ENCOUNTER — Emergency Department (HOSPITAL_COMMUNITY): Payer: Medicare Other

## 2014-06-24 ENCOUNTER — Encounter (HOSPITAL_COMMUNITY): Payer: Self-pay | Admitting: Emergency Medicine

## 2014-06-24 ENCOUNTER — Emergency Department (HOSPITAL_COMMUNITY)
Admission: EM | Admit: 2014-06-24 | Discharge: 2014-06-24 | Disposition: A | Discharge status: Home / Self Care | Payer: Medicare Other | Attending: Emergency Medicine | Admitting: Emergency Medicine

## 2014-06-24 DIAGNOSIS — K5900 Constipation, unspecified: Secondary | ICD-10-CM | POA: Insufficient documentation

## 2014-06-24 DIAGNOSIS — Z720 Tobacco use: Secondary | ICD-10-CM | POA: Insufficient documentation

## 2014-06-24 DIAGNOSIS — G43909 Migraine, unspecified, not intractable, without status migrainosus: Secondary | ICD-10-CM | POA: Diagnosis not present

## 2014-06-24 DIAGNOSIS — Z86018 Personal history of other benign neoplasm: Secondary | ICD-10-CM | POA: Insufficient documentation

## 2014-06-24 DIAGNOSIS — Z79899 Other long term (current) drug therapy: Secondary | ICD-10-CM | POA: Diagnosis not present

## 2014-06-24 DIAGNOSIS — J069 Acute upper respiratory infection, unspecified: Secondary | ICD-10-CM | POA: Insufficient documentation

## 2014-06-24 DIAGNOSIS — H9192 Unspecified hearing loss, left ear: Secondary | ICD-10-CM | POA: Diagnosis not present

## 2014-06-24 DIAGNOSIS — F17210 Nicotine dependence, cigarettes, uncomplicated: Secondary | ICD-10-CM | POA: Diagnosis not present

## 2014-06-24 DIAGNOSIS — R079 Chest pain, unspecified: Secondary | ICD-10-CM | POA: Diagnosis not present

## 2014-06-24 DIAGNOSIS — J9801 Acute bronchospasm: Secondary | ICD-10-CM | POA: Diagnosis not present

## 2014-06-24 DIAGNOSIS — F3181 Bipolar II disorder: Secondary | ICD-10-CM | POA: Insufficient documentation

## 2014-06-24 DIAGNOSIS — R0789 Other chest pain: Secondary | ICD-10-CM | POA: Diagnosis not present

## 2014-06-24 DIAGNOSIS — K589 Irritable bowel syndrome without diarrhea: Secondary | ICD-10-CM | POA: Diagnosis not present

## 2014-06-24 DIAGNOSIS — K219 Gastro-esophageal reflux disease without esophagitis: Secondary | ICD-10-CM | POA: Diagnosis not present

## 2014-06-24 DIAGNOSIS — M797 Fibromyalgia: Secondary | ICD-10-CM | POA: Insufficient documentation

## 2014-06-24 DIAGNOSIS — Z793 Long term (current) use of hormonal contraceptives: Secondary | ICD-10-CM | POA: Diagnosis not present

## 2014-06-24 DIAGNOSIS — F419 Anxiety disorder, unspecified: Secondary | ICD-10-CM | POA: Insufficient documentation

## 2014-06-24 DIAGNOSIS — R05 Cough: Secondary | ICD-10-CM | POA: Diagnosis present

## 2014-06-24 LAB — CBC
HCT: 38.3 % (ref 36.0–46.0)
Hemoglobin: 12.9 g/dL (ref 12.0–15.0)
MCH: 30 pg (ref 26.0–34.0)
MCHC: 33.7 g/dL (ref 30.0–36.0)
MCV: 89.1 fL (ref 78.0–100.0)
Platelets: 241 10*3/uL (ref 150–400)
RBC: 4.3 MIL/uL (ref 3.87–5.11)
RDW: 12.2 % (ref 11.5–15.5)
WBC: 4 10*3/uL (ref 4.0–10.5)

## 2014-06-24 LAB — I-STAT TROPONIN, ED: Troponin i, poc: 0 ng/mL (ref 0.00–0.08)

## 2014-06-24 LAB — BASIC METABOLIC PANEL
Anion gap: 10 (ref 5–15)
BUN: 10 mg/dL (ref 6–23)
CO2: 25 mmol/L (ref 19–32)
Calcium: 9.8 mg/dL (ref 8.4–10.5)
Chloride: 106 mmol/L (ref 96–112)
Creatinine, Ser: 0.55 mg/dL (ref 0.50–1.10)
GFR calc Af Amer: >90 mL/min (ref >90)
GFR calc non Af Amer: >90 mL/min (ref >90)
Glucose, Bld: 115 mg/dL — ABNORMAL HIGH (ref 70–99)
Potassium: 3.5 mmol/L (ref 3.5–5.1)
Sodium: 141 mmol/L (ref 135–145)

## 2014-06-24 MED ORDER — ALBUTEROL SULFATE HFA 108 (90 BASE) MCG/ACT IN AERS
2.0000 | INHALATION_SPRAY | RESPIRATORY_TRACT | Status: DC | PRN
Start: 2014-06-24 — End: 2014-07-13

## 2014-06-24 MED ORDER — GUAIFENESIN-CODEINE 100-10 MG/5ML PO SOLN
10.0000 mL | Freq: Once | ORAL | Status: AC
  Administered 2014-06-24: 10 mL via ORAL
  Filled 2014-06-24: qty 10

## 2014-06-24 MED ORDER — ONDANSETRON 4 MG PO TBDP
4.0000 mg | ORAL_TABLET | Freq: Once | ORAL | Status: AC
  Administered 2014-06-24: 4 mg via ORAL
  Filled 2014-06-24: qty 1

## 2014-06-24 MED ORDER — GUAIFENESIN-CODEINE 100-10 MG/5ML PO SOLN: 10.0000 mL | Freq: Four times a day (QID) | ORAL | Status: DC | PRN

## 2014-06-24 MED ORDER — IPRATROPIUM-ALBUTEROL 0.5-2.5 (3) MG/3ML IN SOLN
3.0000 mL | Freq: Once | RESPIRATORY_TRACT | Status: AC
  Administered 2014-06-24: 3 mL via RESPIRATORY_TRACT
  Filled 2014-06-24: qty 3

## 2014-06-24 MED ORDER — METHYLPREDNISOLONE SODIUM SUCC 125 MG IJ SOLR
125.0000 mg | Freq: Once | INTRAMUSCULAR | Status: AC
  Administered 2014-06-24: 125 mg via INTRAMUSCULAR
  Filled 2014-06-24: qty 2

## 2014-06-24 MED ORDER — FLUTICASONE PROPIONATE 50 MCG/ACT NA SUSP: 2.0000 | Freq: Every day | NASAL | Status: DC

## 2014-06-24 NOTE — ED Notes (Addendum)
Pt reports coughing episodes at home that are worse with sleeping, worse since monday. Pt reports she coughs until she throws up. Started oral prednisone yesterday. Tried tessalon pearls with no relief. Pain initally just with coughing, but now more constant. Family member has been diagnosed with the flu.

## 2014-06-24 NOTE — ED Provider Notes (Signed)
TIME SEEN: 4:10 PM  CHIEF COMPLAINT: Chest pain, coughing  HPI: Pt is a 54 y.o. female with history of migraines, bipolar disorder, IBS, fibromyalgia who presents to the emergency department with one week of coughing, chest pain with coughing, wheezing, fever. States that her mother recently tested flu positive. She states that her symptoms started a week and a half ago when she is not feeling well and had mild cough and progressively worsened. Over the weekend she had temperature 101.5. Last fever was 2 days ago. States she has been seen by her primary care provider multiple times. Given she was outside of any treatment with her for Tamiflu she was not given this medication. They thought her symptoms were secondary to viral illness and did not start her on antibiotics but did start her on steroids yesterday. She states she also had a intramuscular injection of steroids and felt like this helped her symptoms for several days. States her pain is in the center of her chest is like a cramping that is worse with coughing. Has been constant however since yesterday. No history of PE or DVT. No history of ACS. She denies feeling short of breath currently but states she will have episodes where she will cough without stopping and she feels short of breath during this and will have posttussive emesis. No diarrhea. No recent travel. She is not a smoker. Has a history of bronchospasm and seasonal allergies. She is on Qvar, Singulair and azelastine daily.  ROS: See HPI Constitutional:  fever  Eyes: no drainage  ENT: no runny nose   Cardiovascular:   chest pain  Resp:  SOB  GI: Posttussive emesis GU: no dysuria Integumentary: no rash  Allergy: no hives  Musculoskeletal: no leg swelling  Neurological: no slurred speech ROS otherwise negative  PAST MEDICAL HISTORY/PAST SURGICAL HISTORY:  Past Medical History  Diagnosis Date  . GERD (gastroesophageal reflux disease)   . 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 Migraines    . Bipolar 2 disorder   . IBS (irritable bowel syndrome)   . Constipation   . ASCUS (atypical squamous cells of undetermined significance) on Pap smear 05/06/05    NEG HIGH RISK HPV--C&B BIOPSY BENIGN 12/2005  . Anxiety   . 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    cytology negative  . Fibromyalgia   . 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    Bone marrow biopsy showes 8% plasma cells IgA Lambda    MEDICATIONS:  Prior to Admission medications   Medication Sig Start Date End Date Taking? Authorizing Provider  albuterol (PROVENTIL HFA;VENTOLIN HFA) 108 (90 BASE) MCG/ACT inhaler Inhale 2 puffs into the lungs every 6 (six) hours as needed for wheezing or shortness of breath.     Historical Provider, MD  Ascorbic Acid (VITAMIN C) 1000 MG tablet Take 1,000 mg by mouth at bedtime.    Historical Provider, MD  azelastine (ASTELIN) 0.1 % nasal spray Place 2 sprays into both nostrils 2 (two) times daily. Use in each nostril as directed    Historical Provider, MD  beclomethasone (QVAR) 40 MCG/ACT inhaler Inhale 2 puffs into the lungs 2 (two) times daily.    Historical Provider, MD  BEPREVE 1.5 % SOLN Place 1 drop into both eyes 2 (two) times daily.  04/03/13   Historical Provider, MD  diphenhydrAMINE (BENADRYL) 25 mg capsule Take 50 mg by mouth every 6 (six) hours as needed for itching or allergies.     Historical Provider, MD  docusate sodium (COLACE) 100 MG capsule Take 100 mg by mouth every other day. Alternates with the senokot    Historical Provider, MD  esomeprazole (NEXIUM) 40 MG capsule Take 40 mg by mouth 2 (two) times daily.    Historical Provider, MD  fexofenadine (ALLEGRA) 180 MG tablet Take 180 mg by mouth daily.    Historical Provider, MD  fluvoxaMINE (LUVOX) 25 MG tablet Take 50 mg by mouth every morning.     Historical Provider, MD  gabapentin (NEURONTIN) 300 MG capsule Take 300 mg by mouth 2 (two) times daily.     Historical Provider, MD  hyoscyamine (LEVSIN SL)  0.125 MG SL tablet Take 0.125 mg by mouth every 6 (six) hours as needed for cramping.  10/18/12   Historical Provider, MD  lamoTRIgine (LAMICTAL) 200 MG tablet Take 200 mg by mouth at bedtime.    Historical Provider, MD  levonorgestrel (MIRENA) 20 MCG/24HR IUD 1 each by Intrauterine route once.    Historical Provider, MD  LORazepam (ATIVAN) 1 MG tablet Take 1 mg by mouth every 8 (eight) hours as needed for anxiety. (takes 2 mg when having a medical procedure.)    Historical Provider, MD  Magnesium 500 MG TABS Take 1 tablet by mouth at bedtime.    Historical Provider, MD  meclizine (ANTIVERT) 25 MG tablet Take 25 mg by mouth 3 (three) times daily as needed for dizziness.    Historical Provider, MD  Melatonin 10 MG CAPS Take 1 tablet by mouth at bedtime.     Historical Provider, MD  methylphenidate [facility]) 10 mg/9hr patch Place 1 patch onto the skin as needed. wear patch for 9 hours only each day    Historical Provider, MD  montelukast (SINGULAIR) 10 MG tablet Take 10 mg by mouth at bedtime.    Historical Provider, MD  Multiple Vitamin (MULTIVITAMIN WITH MINERALS) TABS Take 1 tablet by mouth at bedtime.    Historical Provider, MD  Omega-3 Fatty Acids (OMEGA 3 PO) Take 1 tablet by mouth at bedtime.    Historical Provider, MD  OVER THE COUNTER MEDICATION Take 1 tablet by mouth daily with supper. Calcium-Vitamin D- K2 -600-100-90 mg    Historical Provider, MD  oxyCODONE-acetaminophen (PERCOCET/ROXICET) 5-325 MG per tablet Take 1-2 tablets by mouth every 6 (six) hours as needed for moderate pain or severe pain.    Historical Provider, MD  polyethylene glycol (MIRALAX / GLYCOLAX) packet Take 17 g by mouth at bedtime.     Historical Provider, MD  pramipexole (MIRAPEX) 0.125 MG tablet Take 0.125 mg by mouth daily. 03/16/14   Historical Provider, MD  PRESCRIPTION MEDICATION Inject 1 each into the skin 2 (two) times a week. Allergy shot for Cats.    Historical Provider, MD  PRESCRIPTION MEDICATION Inject 1  each into the skin 2 (two) times a week. Combination Allergy shot.    Historical Provider, MD  promethazine (PHENERGAN) 25 MG tablet Take 25 mg by mouth every 6 (six) hours as needed for nausea.     Historical Provider, MD  risperiDONE (RISPERDAL) 0.25 MG tablet Take 0.25 mg by mouth. As needed for an anxiety attack    Historical Provider, MD  senna (SENOKOT) 8.6 MG TABS Take 1 tablet by mouth every other day.     Historical Provider, MD  topiramate (TOPAMAX) 25 MG tablet Take 75 mg by mouth daily.     Historical Provider, MD  traZODone (DESYREL) 50 MG tablet Take 50 mg by mouth at bedtime.  Historical Provider, MD    ALLERGIES:  Allergies  Allergen Reactions  . Sulfa Antibiotics Nausea And Vomiting    Causes pt to vomit blood  . Flagyl [Metronidazole Hcl] Nausea And Vomiting  . Lactose Intolerance (Gi) Nausea And Vomiting  . Morphine And Related Other (See Comments)    Reaction: Depression, emotional    SOCIAL HISTORY:  History  Substance Use Topics  . Smoking status: Current Some Day Smoker    Types: Cigarettes  . Smokeless tobacco: Never Used  . Alcohol Use: Yes     Comment: rare    FAMILY HISTORY: Family History  Problem Relation Age of Onset  . Diabetes Father   . Hypertension Father   . Hyperlipidemia Father   . Heart disease Maternal Grandfather   . Heart disease Paternal Grandmother   . Heart disease Paternal Grandfather     EXAM: BP 129/74 mmHg  Pulse 91  Temp(Src) 98.5 F (36.9 C) (Oral)  Resp 16  SpO2 95%  LMP 12/28/2010 CONSTITUTIONAL: Alert and oriented and responds appropriately to questions. Well-appearing; well-nourished, nontoxic but appears anxious HEAD: Normocephalic EYES: Conjunctivae clear, PERRL ENT: normal nose; no rhinorrhea; moist mucous membranes; pharynx without lesions noted NECK: Supple, no meningismus, no LAD  CARD: RRR; S1 and S2 appreciated; no murmurs, no clicks, no rubs, no gallops RESP: Normal chest excursion without  splinting or tachypnea; breath sounds clear and equal bilaterally; no wheezes, no rhonchi, no rales, no hypoxia or respiratory distress, speaking full sentences, left chest wall is tender to palpation without crepitus or ecchymosis or deformity ABD/GI: Normal bowel sounds; non-distended; soft, non-tender, no rebound, no guarding BACK:  The back appears normal and is non-tender to palpation, there is no CVA tenderness EXT: Normal ROM in all joints; non-tender to palpation; no edema; normal capillary refill; no cyanosis, no calf tenderness or swelling    SKIN: Normal color for age and race; warm NEURO: Moves all extremities equally PSYCH: The patient's mood and manner are appropriate. Grooming and personal hygiene are appropriate.  MEDICAL DECISION MAKING: Patient here with likely viral upper respiratory infection causing bronchospasm. Will give dose of IM Solu-Medrol in the ED as well as a DuoNeb to help with symptoms although her lungs are clear.  Labs ordered in triage are unremarkable including normal leukocytosis and negative troponin. Chest x-ray clear. We'll give guaifenesin with codeine for pain and cough. States she is using Best boy at home without relief.  ED PROGRESS: Patient reports feeling much better. She is smiling, laughing with family at bedside. I feel she is safe to go home. We'll give her a albuterol prescription. She is are you on steroids at home. We'll provide her prescription for guaifenesin with codeine for cough and pain suppression. We'll also start her on Flonase nasal spray. Discussed return precautions. Discussed why do not feel she needs to be on antibiotics at this time given her chest x-ray is clear and she has had a fever in 48 hours. Discussed with patient that I think that her symptoms are secondary to viral illness, possible flu that started over one week ago and she would be outside of treatment window for Tamiflu. She verbalized understanding and is comfortable  with this plan.      EKG Interpretation  Date/Time:  Wednesday June 24 2014 13:07:19 EDT Ventricular Rate:  74 PR Interval:    QRS Duration: 102 QT Interval:  392 QTC Calculation: 435 R Axis:   -15 Text Interpretation:  Atrial fibrillation Borderline left axis  deviation RSR' in V1 or V2, probably normal variant No significant change since last tracing Confirmed by WARD,  DO, KRISTEN [phone]) on 06/24/2014 4:13:39 PM        Belt, DO 06/24/14 1756

## 2014-06-24 NOTE — Discharge Instructions (Signed)
Bronchospasm A bronchospasm is a spasm or tightening of the airways going into the lungs. During a bronchospasm breathing becomes more difficult because the airways get smaller. When this happens there can be coughing, a whistling sound when breathing (wheezing), and difficulty breathing. Bronchospasm is often associated with asthma, but not all patients who experience a bronchospasm have asthma. CAUSES  A bronchospasm is caused by inflammation or irritation of the airways. The inflammation or irritation may be triggered by:   Allergies (such as to animals, pollen, food, or mold). Allergens that cause bronchospasm may cause wheezing immediately after exposure or many hours later.   Infection. Viral infections are believed to be the most common cause of bronchospasm.   Exercise.   Irritants (such as pollution, cigarette smoke, strong odors, aerosol sprays, and paint fumes).   Weather changes. Winds increase molds and pollens in the air. Rain refreshes the air by washing irritants out. Cold air may cause inflammation.   Stress and emotional upset.  SIGNS AND SYMPTOMS   Wheezing.   Excessive nighttime coughing.   Frequent or severe coughing with a simple cold.   Chest tightness.   Shortness of breath.  DIAGNOSIS  Bronchospasm is usually diagnosed through a history and physical exam. Tests, such as chest X-rays, are sometimes done to look for other conditions. TREATMENT   Inhaled medicines can be given to open up your airways and help you breathe. The medicines can be given using either an inhaler or a nebulizer machine.  Corticosteroid medicines may be given for severe bronchospasm, usually when it is associated with asthma. HOME CARE INSTRUCTIONS   Always have a plan prepared for seeking medical care. Know when to call your health care provider and local emergency services (911 in the [place] Know where you can access local emergency care.  Only take medicines as  directed by your health care provider.  If you were prescribed an inhaler or nebulizer machine, ask your health care provider to explain how to use it correctly. Always use a spacer with your inhaler if you were given one.  It is necessary to remain calm during an attack. Try to relax and breathe more slowly.  Control your home environment in the following ways:   Change your heating and air conditioning filter at least once a month.   Limit your use of fireplaces and wood stoves.  Do not smoke and do not allow smoking in your home.   Avoid exposure to perfumes and fragrances.   Get rid of pests (such as roaches and mice) and their droppings.   Throw away plants if you see mold on them.   Keep your house clean and dust free.   Replace carpet with wood, tile, or vinyl flooring. Carpet can trap dander and dust.   Use allergy-proof pillows, mattress covers, and box spring covers.   Wash bed sheets and blankets every week in hot water and dry them in a dryer.   Use blankets that are made of polyester or cotton.   Wash hands frequently. SEEK MEDICAL CARE IF:   You have muscle aches.   You have chest pain.   The sputum changes from clear or white to yellow, green, gray, or bloody.   The sputum you cough up gets thicker.   There are problems that may be related to the medicine you are given, such as a rash, itching, swelling, or trouble breathing.  SEEK IMMEDIATE MEDICAL CARE IF:   You have worsening wheezing and coughing even  after taking your prescribed medicines.   You have increased difficulty breathing.   You develop severe chest pain. MAKE SURE YOU:   Understand these instructions.  Will watch your condition.  Will get help right away if you are not doing well or get worse. Document Released: 02/16/2003 Document Revised: 02/18/2013 Document Reviewed: 08/05/2012 [facility] Patient Information 2015 [place], [place]. This information is not  intended to replace advice given to you by your health care provider. Make sure you discuss any questions you have with your health care provider.  Chest Wall Pain Chest wall pain is pain in or around the bones and muscles of your chest. It may take up to 6 weeks to get better. It may take longer if you must stay physically active in your work and activities.  CAUSES  Chest wall pain may happen on its own. However, it may be caused by:  A viral illness like the flu.  Injury.  Coughing.  Exercise.  Arthritis.  Fibromyalgia.  Shingles. HOME CARE INSTRUCTIONS   Avoid overtiring physical activity. Try not to strain or perform activities that cause pain. This includes any activities using your chest or your abdominal and side muscles, especially if heavy weights are used.  Put ice on the sore area.  Put ice in a plastic bag.  Place a towel between your skin and the bag.  Leave the ice on for 15-20 minutes per hour while awake for the first 2 days.  Only take over-the-counter or prescription medicines for pain, discomfort, or fever as directed by your caregiver. SEEK IMMEDIATE MEDICAL CARE IF:   Your pain increases, or you are very uncomfortable.  You have a fever.  Your chest pain becomes worse.  You have new, unexplained symptoms.  You have nausea or vomiting.  You feel sweaty or lightheaded.  You have a cough with phlegm (sputum), or you cough up blood. MAKE SURE YOU:   Understand these instructions.  Will watch your condition.  Will get help right away if you are not doing well or get worse. Document Released: 02/13/2005 Document Revised: 05/08/2011 Document Reviewed: 10/10/2010 [facility] Patient Information 2015 [place], [place]. This information is not intended to replace advice given to you by your health care provider. Make sure you discuss any questions you have with your health care provider.  Upper Respiratory Infection, Adult An upper respiratory  infection (URI) is also sometimes known as the common cold. The upper respiratory tract includes the nose, sinuses, throat, trachea, and bronchi. Bronchi are the airways leading to the lungs. Most people improve within 1 week, but symptoms can last up to 2 weeks. A residual cough may last even longer.  CAUSES Many different viruses can infect the tissues lining the upper respiratory tract. The tissues become irritated and inflamed and often become very moist. Mucus production is also common. A cold is contagious. You can easily spread the virus to others by oral contact. This includes kissing, sharing a glass, coughing, or sneezing. Touching your mouth or nose and then touching a surface, which is then touched by another person, can also spread the virus. SYMPTOMS  Symptoms typically develop 1 to 3 days after you come in contact with a cold virus. Symptoms vary from person to person. They may include:  Runny nose.  Sneezing.  Nasal congestion.  Sinus irritation.  Sore throat.  Loss of voice (laryngitis).  Cough.  Fatigue.  Muscle aches.  Loss of appetite.  Headache.  Low-grade fever. DIAGNOSIS  You might diagnose  your own cold based on familiar symptoms, since most people get a cold 2 to 3 times a year. Your caregiver can confirm this based on your exam. Most importantly, your caregiver can check that your symptoms are not due to another disease such as strep throat, sinusitis, pneumonia, asthma, or epiglottitis. Blood tests, throat tests, and X-rays are not necessary to diagnose a common cold, but they may sometimes be helpful in excluding other more serious diseases. Your caregiver will decide if any further tests are required. RISKS AND COMPLICATIONS  You may be at risk for a more severe case of the common cold if you smoke cigarettes, have chronic heart disease (such as heart failure) or lung disease (such as asthma), or if you have a weakened immune system. The very young and  very old are also at risk for more serious infections. Bacterial sinusitis, middle ear infections, and bacterial pneumonia can complicate the common cold. The common cold can worsen asthma and chronic obstructive pulmonary disease (COPD). Sometimes, these complications can require emergency medical care and may be life-threatening. PREVENTION  The best way to protect against getting a cold is to practice good hygiene. Avoid oral or hand contact with people with cold symptoms. Wash your hands often if contact occurs. There is no clear evidence that vitamin C, vitamin E, echinacea, or exercise reduces the chance of developing a cold. However, it is always recommended to get plenty of rest and practice good nutrition. TREATMENT  Treatment is directed at relieving symptoms. There is no cure. Antibiotics are not effective, because the infection is caused by a virus, not by bacteria. Treatment may include:  Increased fluid intake. Sports drinks offer valuable electrolytes, sugars, and fluids.  Breathing heated mist or steam (vaporizer or shower).  Eating chicken soup or other clear broths, and maintaining good nutrition.  Getting plenty of rest.  Using gargles or lozenges for comfort.  Controlling fevers with ibuprofen or acetaminophen as directed by your caregiver.  Increasing usage of your inhaler if you have asthma. Zinc gel and zinc lozenges, taken in the first 24 hours of the common cold, can shorten the duration and lessen the severity of symptoms. Pain medicines may help with fever, muscle aches, and throat pain. A variety of non-prescription medicines are available to treat congestion and runny nose. Your caregiver can make recommendations and may suggest nasal or lung inhalers for other symptoms.  HOME CARE INSTRUCTIONS   Only take over-the-counter or prescription medicines for pain, discomfort, or fever as directed by your caregiver.  Use a warm mist humidifier or inhale steam from a  shower to increase air moisture. This may keep secretions moist and make it easier to breathe.  Drink enough water and fluids to keep your urine clear or pale yellow.  Rest as needed.  Return to work when your temperature has returned to normal or as your caregiver advises. You may need to stay home longer to avoid infecting others. You can also use a face mask and careful hand washing to prevent spread of the virus. SEEK MEDICAL CARE IF:   After the first few days, you feel you are getting worse rather than better.  You need your caregiver's advice about medicines to control symptoms.  You develop chills, worsening shortness of breath, or brown or red sputum. These may be signs of pneumonia.  You develop yellow or brown nasal discharge or pain in the face, especially when you bend forward. These may be signs of sinusitis.  You  develop a fever, swollen neck glands, pain with swallowing, or white areas in the back of your throat. These may be signs of strep throat. SEEK IMMEDIATE MEDICAL CARE IF:   You have a fever.  You develop severe or persistent headache, ear pain, sinus pain, or chest pain.  You develop wheezing, a prolonged cough, cough up blood, or have a change in your usual mucus (if you have chronic lung disease).  You develop sore muscles or a stiff neck. Document Released: 08/09/2000 Document Revised: 05/08/2011 Document Reviewed: 05/21/2013 [facility] Patient Information 2015 [place], [place]. This information is not intended to replace advice given to you by your health care provider. Make sure you discuss any questions you have with your health care provider.

## 2014-06-25 ENCOUNTER — Telehealth: Payer: Self-pay | Admitting: *Deleted

## 2014-06-26 NOTE — Telephone Encounter (Signed)
Opened in error

## 2014-06-29 DIAGNOSIS — J301 Allergic rhinitis due to pollen: Secondary | ICD-10-CM | POA: Diagnosis not present

## 2014-06-29 DIAGNOSIS — J3089 Other allergic rhinitis: Secondary | ICD-10-CM | POA: Diagnosis not present

## 2014-06-29 DIAGNOSIS — J209 Acute bronchitis, unspecified: Secondary | ICD-10-CM | POA: Diagnosis not present

## 2014-06-29 DIAGNOSIS — H1045 Other chronic allergic conjunctivitis: Secondary | ICD-10-CM | POA: Diagnosis not present

## 2014-06-29 DIAGNOSIS — J3081 Allergic rhinitis due to animal (cat) (dog) hair and dander: Secondary | ICD-10-CM | POA: Diagnosis not present

## 2014-07-01 ENCOUNTER — Telehealth: Payer: Self-pay | Admitting: *Deleted

## 2014-07-01 NOTE — Telephone Encounter (Signed)
Danielle Bruce called asking if she "needs to reschedule lab appointment for 07-07-2014 at 3:00 pm.  I am struggling with asthmatic bronchitis for the past several weeks.  Received depo-medrol injections 06-19-2014 and 06-24-2014.  Taking oral prednisone from 06-23-2014 through 07-06-2014.  I was told these medicines will affect my lab results.   Consulted with Dr. Julien Nordmann.  Verbal orders received and read back to keep appointments as is.  The results could be slightly decreased but not enough to alter outcomes of the cbc, cmet, beta 2 microglobin, kappa lamda, qig.  Shared this information and she will come in for labs and F/U as scheduled.

## 2014-07-06 DIAGNOSIS — R194 Change in bowel habit: Secondary | ICD-10-CM | POA: Diagnosis not present

## 2014-07-06 DIAGNOSIS — M62838 Other muscle spasm: Secondary | ICD-10-CM | POA: Diagnosis not present

## 2014-07-06 DIAGNOSIS — Z86010 Personal history of colonic polyps: Secondary | ICD-10-CM | POA: Diagnosis not present

## 2014-07-06 DIAGNOSIS — K219 Gastro-esophageal reflux disease without esophagitis: Secondary | ICD-10-CM | POA: Diagnosis not present

## 2014-07-06 DIAGNOSIS — G4700 Insomnia, unspecified: Secondary | ICD-10-CM | POA: Diagnosis not present

## 2014-07-06 DIAGNOSIS — M797 Fibromyalgia: Secondary | ICD-10-CM | POA: Diagnosis not present

## 2014-07-06 DIAGNOSIS — M7071 Other bursitis of hip, right hip: Secondary | ICD-10-CM | POA: Diagnosis not present

## 2014-07-07 ENCOUNTER — Other Ambulatory Visit (HOSPITAL_BASED_OUTPATIENT_CLINIC_OR_DEPARTMENT_OTHER): Payer: Medicare Other

## 2014-07-07 DIAGNOSIS — D472 Monoclonal gammopathy: Secondary | ICD-10-CM

## 2014-07-07 LAB — CBC WITH DIFFERENTIAL/PLATELET
BASO%: 0.7 % (ref 0.0–2.0)
Basophils Absolute: 0.1 10*3/uL (ref 0.0–0.1)
EOS%: 1.5 % (ref 0.0–7.0)
Eosinophils Absolute: 0.1 10*3/uL (ref 0.0–0.5)
HCT: 39.9 % (ref 34.8–46.6)
HGB: 13.6 g/dL (ref 11.6–15.9)
LYMPH%: 21.1 % (ref 14.0–49.7)
MCH: 30.9 pg (ref 25.1–34.0)
MCHC: 34.1 g/dL (ref 31.5–36.0)
MCV: 90.7 fL (ref 79.5–101.0)
MONO#: 0.6 10*3/uL (ref 0.1–0.9)
MONO%: 7.6 % (ref 0.0–14.0)
NEUT#: 5.7 10*3/uL (ref 1.5–6.5)
NEUT%: 69.1 % (ref 38.4–76.8)
Platelets: 338 10*3/uL (ref 145–400)
RBC: 4.4 10*6/uL (ref 3.70–5.45)
RDW: 13.1 % (ref 11.2–14.5)
WBC: 8.2 10*3/uL (ref 3.9–10.3)
lymph#: 1.7 10*3/uL (ref 0.9–3.3)

## 2014-07-07 LAB — COMPREHENSIVE METABOLIC PANEL (CC13)
ALT: 25 U/L (ref 0–55)
AST: 17 U/L (ref 5–34)
Albumin: 4.1 g/dL (ref 3.5–5.0)
Alkaline Phosphatase: 55 U/L (ref 40–150)
Anion Gap: 10 mEq/L (ref 3–11)
BUN: 11.1 mg/dL (ref 7.0–26.0)
CO2: 25 mEq/L (ref 22–29)
Calcium: 8.6 mg/dL (ref 8.4–10.4)
Chloride: 105 mEq/L (ref 98–109)
Creatinine: 0.8 mg/dL (ref 0.6–1.1)
EGFR: 86 mL/min/{1.73_m2} — ABNORMAL LOW (ref >90)
Glucose: 112 mg/dl (ref 70–140)
Potassium: 3.5 mEq/L (ref 3.5–5.1)
Sodium: 140 mEq/L (ref 136–145)
Total Bilirubin: 0.4 mg/dL (ref 0.20–1.20)
Total Protein: 6.6 g/dL (ref 6.4–8.3)

## 2014-07-07 LAB — LACTATE DEHYDROGENASE (CC13): LDH: 194 U/L (ref 125–245)

## 2014-07-09 LAB — BETA 2 MICROGLOBULIN, SERUM: Beta-2 Microglobulin: 1.59 mg/L (ref <=2.51)

## 2014-07-09 LAB — KAPPA/LAMBDA LIGHT CHAINS
Kappa free light chain: 0.26 mg/dL — ABNORMAL LOW (ref 0.33–1.94)
Kappa:Lambda Ratio: 0.65 (ref 0.26–1.65)
Lambda Free Lght Chn: 0.4 mg/dL — ABNORMAL LOW (ref 0.57–2.63)

## 2014-07-09 LAB — IGG, IGA, IGM
IgA: 169 mg/dL (ref 69–380)
IgG (Immunoglobin G), Serum: 520 mg/dL — ABNORMAL LOW (ref 690–1700)
IgM, Serum: 43 mg/dL — ABNORMAL LOW (ref 52–322)

## 2014-07-10 DIAGNOSIS — J301 Allergic rhinitis due to pollen: Secondary | ICD-10-CM | POA: Diagnosis not present

## 2014-07-10 DIAGNOSIS — J3081 Allergic rhinitis due to animal (cat) (dog) hair and dander: Secondary | ICD-10-CM | POA: Diagnosis not present

## 2014-07-10 DIAGNOSIS — J3089 Other allergic rhinitis: Secondary | ICD-10-CM | POA: Diagnosis not present

## 2014-07-13 ENCOUNTER — Encounter: Payer: Self-pay | Admitting: Internal Medicine

## 2014-07-13 ENCOUNTER — Telehealth: Payer: Self-pay | Admitting: Internal Medicine

## 2014-07-13 ENCOUNTER — Ambulatory Visit (HOSPITAL_BASED_OUTPATIENT_CLINIC_OR_DEPARTMENT_OTHER): Payer: Medicare Other | Admitting: Internal Medicine

## 2014-07-13 VITALS — BP 98/59 | HR 76 | Temp 98.6°F | Resp 18 | Ht 62.0 in | Wt 118.4 lb

## 2014-07-13 DIAGNOSIS — D472 Monoclonal gammopathy: Secondary | ICD-10-CM | POA: Diagnosis not present

## 2014-07-13 DIAGNOSIS — R5383 Other fatigue: Secondary | ICD-10-CM

## 2014-07-13 NOTE — Progress Notes (Signed)
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 2) fibromyalgia. 3) bipolar disorder  PRIOR THERAPY: None  CURRENT THERAPY: Observation  INTERVAL HISTORY: Danielle Bruce 54 y.o. female returns to the clinic today for follow-up visit.  She has been on observation and she is here today for reevaluation of her condition. She is feeling fine today with no specific complaints. She denied having any significant fever or chills, no nausea or vomiting. She denied having any significant chest pain, shortness of breath, cough or hemoptysis. The patient continues to have fatigue and aching pain from her fibromyalgia and this is followed by Dr. Estanislado Pandy. She is currently on Neurontin 300 mg by mouth twice a day. The patient had repeat myeloma panel performed earlier today and she is here for evaluation and discussion of her condition.  MEDICAL HISTORY: Past Medical History  Diagnosis Date  . GERD (gastroesophageal reflux disease)   . 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 Migraines   . Bipolar 2 disorder   . IBS (irritable bowel syndrome)   . Constipation   . ASCUS (atypical squamous cells of undetermined significance) on Pap smear 05/06/05    NEG HIGH RISK HPV--C&B BIOPSY BENIGN 12/2005  . Anxiety   . 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    cytology negative  . Fibromyalgia   . 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    Bone marrow biopsy showes 8% plasma cells IgA Lambda    ALLERGIES:  is allergic to sulfa antibiotics; flagyl; gluten meal; lactose intolerance (gi); and morphine and related.  MEDICATIONS:  Current Outpatient Prescriptions  Medication Sig Dispense Refill  . ARNUITY ELLIPTA 100 MCG/ACT AEPB Inhale 1 puff into the lungs daily.    . Ascorbic Acid  (VITAMIN C) 1000 MG tablet Take 1,000 mg by mouth at bedtime.    Marland Kitchen azelastine (ASTELIN) 0.1 % nasal spray Place 2 sprays into both nostrils 2 (two) times daily. Use in each nostril as directed    . beclomethasone (QVAR) 40 MCG/ACT inhaler Inhale 2 puffs into the lungs 2 (two) times daily.    Marland Kitchen BENZONATATE PO Take by mouth.    Marland Kitchen BEPREVE 1.5 % SOLN Place 1 drop into both eyes daily.     . Calcium Carbonate-Vitamin D (CALCIUM + D PO) Take 1 tablet by mouth daily.    . chlorpheniramine-HYDROcodone (TUSSIONEX) 10-8 MG/5ML SUER Take 5 mLs by mouth at bedtime as needed for cough.    . cholecalciferol (VITAMIN D) 1000 UNITS tablet Take 2,000 Units by mouth daily.    Marland Kitchen dexlansoprazole (DEXILANT) 60 MG capsule Take 60 mg by mouth daily.    Marland Kitchen dicyclomine (BENTYL) 10 MG capsule Take 1 capsule by mouth 4 (four) times daily as needed for spasms.   0  . diphenhydrAMINE (BENADRYL) 25 mg capsule Take 50 mg by mouth every 6 (six) hours as needed for itching or allergies.     Marland Kitchen docusate sodium (COLACE) 100 MG capsule Take 100 mg by mouth every other day. Alternates with the senokot    . esomeprazole (NEXIUM) 40 MG capsule Take 40 mg by mouth 2 (two) times daily.    . fexofenadine (ALLEGRA) 180 MG tablet Take 180 mg by mouth daily.    . fluticasone (FLONASE) 50 MCG/ACT nasal spray Place 2  sprays into both nostrils daily. 16 g 0  . fluvoxaMINE (LUVOX) 25 MG tablet Take 25 mg by mouth every morning.     . gabapentin (NEURONTIN) 300 MG capsule Take 300 mg by mouth 2 (two) times daily.     Marland Kitchen guaiFENesin-codeine 100-10 MG/5ML syrup Take 10 mLs by mouth every 6 (six) hours as needed for cough. 120 mL 0  . hyoscyamine (LEVSIN SL) 0.125 MG SL tablet Take 0.125 mg by mouth every 6 (six) hours as needed for cramping.     . lamoTRIgine (LAMICTAL) 200 MG tablet Take 200 mg by mouth at bedtime.    Marland Kitchen levonorgestrel (MIRENA) 20 MCG/24HR IUD 1 each by Intrauterine route once.    Marland Kitchen LORazepam (ATIVAN) 1 MG tablet Take 1 mg by mouth  every 8 (eight) hours as needed for anxiety. (takes 2 mg when having a medical procedure.)    . Magnesium 500 MG TABS Take 1 tablet by mouth at bedtime.    . magnesium oxide (MAG-OX) 400 MG tablet Take 400 mg by mouth daily.    . meclizine (ANTIVERT) 25 MG tablet Take 25 mg by mouth 3 (three) times daily as needed for dizziness.    . Melatonin 10 MG CAPS Take 1 tablet by mouth at bedtime.     . methocarbamol (ROBAXIN) 500 MG tablet Take 500 mg by mouth 2 (two) times daily as needed.  2  . methylphenidate (DAYTRANA) 10 mg/9hr patch Place 1 patch onto the skin as needed. wear patch for 9 hours only each day    . montelukast (SINGULAIR) 10 MG tablet Take 10 mg by mouth at bedtime.    . Multiple Vitamin (MULTIVITAMIN WITH MINERALS) TABS Take 1 tablet by mouth at bedtime.    . NON FORMULARY Apply 1 application topically as needed (fibromyalgia pain.).    Marland Kitchen Omega-3 Fatty Acids (OMEGA 3 PO) Take 1 tablet by mouth at bedtime.    Marland Kitchen OVER THE COUNTER MEDICATION Take 1 tablet by mouth daily with supper. Calcium-Vitamin D- K2 -600-100-90 mg    . oxyCODONE-acetaminophen (PERCOCET/ROXICET) 5-325 MG per tablet Take 1-2 tablets by mouth daily as needed for moderate pain or severe pain (mighraine.).     Marland Kitchen polyethylene glycol (MIRALAX / GLYCOLAX) packet Take 17 g by mouth at bedtime.     . pramipexole (MIRAPEX) 0.125 MG tablet Take 0.125 mg by mouth daily.  1  . predniSONE (DELTASONE) 20 MG tablet Take 20 mg by mouth as directed. Per taper instructions.    Marland Kitchen PRESCRIPTION MEDICATION Inject 1 each into the skin 2 (two) times a week. Allergy shot for Cats.    Marland Kitchen PRESCRIPTION MEDICATION Inject 1 each into the skin 2 (two) times a week. Combination Allergy shot.    . promethazine (PHENERGAN) 25 MG tablet Take 25 mg by mouth every 6 (six) hours as needed for nausea.     . risperiDONE (RISPERDAL) 0.25 MG tablet Take 0.25 mg by mouth. As needed for an anxiety attack    . senna (SENOKOT) 8.6 MG TABS Take 1 tablet by mouth  every other day.     . SYMBICORT 160-4.5 MCG/ACT inhaler   3  . tobramycin-dexamethasone (TOBRADEX) ophthalmic solution Place 1 drop into the left eye 4 (four) times daily. For 5 days.  0  . topiramate (TOPAMAX) 100 MG tablet Take 100 mg by mouth daily.    Marland Kitchen topiramate (TOPAMAX) 25 MG tablet Take 75 mg by mouth daily.     . traZODone (DESYREL) 50  MG tablet Take 100 mg by mouth at bedtime.     . VOLTAREN 1 % GEL   3   No current facility-administered medications for this visit.    SURGICAL HISTORY:  Past Surgical History  Procedure Laterality Date  . Hemorrhoid surgery  1993    x3  . Cesarean section  [id number]  . Shoulder surgery  2007/2008  . Spine surgery  2010    cervical  . Dilation and curettage of uterus    . Pelvic laparoscopy    . Intrauterine device insertion  01/04/2010    MIRENA  . Laparoscopy N/A 10/16/2012    Procedure: LAPAROSCOPY DIAGNOSTIC;  Surgeon: Joyice Faster. Cornett, MD;  Location: [facility];  Service: General;  Laterality: N/A;  . Cholecystectomy N/A 10/16/2012    Procedure: LAPAROSCOPIC CHOLECYSTECTOMY;  Surgeon: Joyice Faster. Cornett, MD;  Location: [facility];  Service: General;  Laterality: N/A;  . Laparoscopic lysis of adhesions N/A 10/16/2012    Procedure: LAPAROSCOPIC LYSIS OF ADHESIONS;  Surgeon: Joyice Faster. Cornett, MD;  Location: [facility];  Service: General;  Laterality: N/A;  . Bone marrow biopsy Left 12/18/2013    Plasma cell dyscrasia 8% population of plasma cells    REVIEW OF SYSTEMS:  Constitutional: positive for fatigue Eyes: negative Ears, nose, mouth, throat, and face: negative Respiratory: negative Cardiovascular: negative Gastrointestinal: negative Genitourinary:negative Integument/breast: negative Hematologic/lymphatic: negative Musculoskeletal:positive for arthralgias and myalgias Neurological: negative Behavioral/Psych: positive for anxiety and depression Endocrine: negative Allergic/Immunologic: negative   PHYSICAL EXAMINATION: General  appearance: alert, cooperative, fatigued and no distress Head: Normocephalic, without obvious abnormality, atraumatic Neck: no adenopathy, no JVD, supple, symmetrical, trachea midline and thyroid not enlarged, symmetric, no tenderness/mass/nodules Lymph nodes: Cervical, supraclavicular, and axillary nodes normal. Resp: clear to auscultation bilaterally Back: symmetric, no curvature. ROM normal. No CVA tenderness. Cardio: regular rate and rhythm, S1, S2 normal, no murmur, click, rub or gallop GI: soft, non-tender; bowel sounds normal; no masses,  no organomegaly Extremities: extremities normal, atraumatic, no cyanosis or edema Neurologic: Alert and oriented X 3, normal strength and tone. Normal symmetric reflexes. Normal coordination and gait  ECOG PERFORMANCE STATUS: 1 - Symptomatic but completely ambulatory  Blood pressure 98/59, pulse 76, temperature 98.6 F (37 C), temperature source Oral, resp. rate 18, height 5' 2"  (1.575 m), weight 118 lb 6.4 oz (53.706 kg), last menstrual period 12/28/2010, SpO2 99 %.  LABORATORY DATA: Lab Results  Component Value Date   WBC 8.2 07/07/2014   HGB 13.6 07/07/2014   HCT 39.9 07/07/2014   MCV 90.7 07/07/2014   PLT 338 07/07/2014      Chemistry      Component Value Date/Time   NA 140 07/07/2014 1505   NA 141 06/24/2014 1402   K 3.5 07/07/2014 1505   K 3.5 06/24/2014 1402   CL 106 06/24/2014 1402   CO2 25 07/07/2014 1505   CO2 25 06/24/2014 1402   BUN 11.1 07/07/2014 1505   BUN 10 06/24/2014 1402   CREATININE 0.8 07/07/2014 1505   CREATININE 0.55 06/24/2014 1402      Component Value Date/Time   CALCIUM 8.6 07/07/2014 1505   CALCIUM 9.8 06/24/2014 1402   ALKPHOS 55 07/07/2014 1505   ALKPHOS 57 10/11/2012 1309   AST 17 07/07/2014 1505   AST 32 10/11/2012 1309   ALT 25 07/07/2014 1505   ALT 35 10/11/2012 1309   BILITOT 0.40 07/07/2014 1505   BILITOT 0.5 10/11/2012 1309     Myeloma panel: Beta-2 microglobulin 1.59, free kappa light  chain 0.26, free lambda light chain 0.40 with a kappa/lambda ratio 0.65. IgG 520, IgA 169 and IgM 43.  RADIOGRAPHIC STUDIES: Dg Chest 2 View (if Patient Has Fever And/or Copd)  06/24/2014   CLINICAL DATA:  Worsening cough.  Chest pain.  EXAM: CHEST  2 VIEW  COMPARISON:  Bone survey 12/10/2013  FINDINGS: The cardiomediastinal silhouette is within normal limits. The lungs are well inflated and clear. There is no evidence of pleural effusion or pneumothorax. No acute osseous abnormality is identified. Right upper quadrant abdominal surgical clips are noted. Prior cervical ACDF.  IMPRESSION: No active cardiopulmonary disease.   Electronically Signed   By: Logan Bores   On: 06/24/2014 14:37    ASSESSMENT AND PLAN: This is a very pleasant 54 years old white female with history of monoclonal gammopathy of undetermined significance in addition to other history of fibromyalgia, bipolar disorder, anxiety and depression as well as irritable bowel syndrome. The patient is doing fine today except for the fatigue. Her myeloma panel today showed no significant evidence for disease progression. I discussed the lab result with the patient today. I recommended for her to continue on observation with repeat myeloma panel in one year. She was advised to call immediately if she has any concerning symptoms in the interval. The patient voices understanding of current disease status and treatment options and is in agreement with the current care plan.  All questions were answered. The patient knows to call the clinic with any problems, questions or concerns. We can certainly see the patient much sooner if necessary.  Disclaimer: This note was dictated with voice recognition software. Similar sounding words can inadvertently be transcribed and may not be corrected upon review.

## 2014-07-13 NOTE — Telephone Encounter (Signed)
Gave and printed appt shced and avs fo rpt for May 2017

## 2014-07-14 DIAGNOSIS — J4530 Mild persistent asthma, uncomplicated: Secondary | ICD-10-CM | POA: Diagnosis not present

## 2014-07-14 DIAGNOSIS — J3081 Allergic rhinitis due to animal (cat) (dog) hair and dander: Secondary | ICD-10-CM | POA: Diagnosis not present

## 2014-07-14 DIAGNOSIS — J3089 Other allergic rhinitis: Secondary | ICD-10-CM | POA: Diagnosis not present

## 2014-07-14 DIAGNOSIS — H1045 Other chronic allergic conjunctivitis: Secondary | ICD-10-CM | POA: Diagnosis not present

## 2014-07-14 DIAGNOSIS — J301 Allergic rhinitis due to pollen: Secondary | ICD-10-CM | POA: Diagnosis not present

## 2014-07-16 DIAGNOSIS — D472 Monoclonal gammopathy: Secondary | ICD-10-CM | POA: Diagnosis not present

## 2014-07-16 DIAGNOSIS — G43909 Migraine, unspecified, not intractable, without status migrainosus: Secondary | ICD-10-CM | POA: Diagnosis not present

## 2014-07-16 DIAGNOSIS — M797 Fibromyalgia: Secondary | ICD-10-CM | POA: Diagnosis not present

## 2014-07-16 DIAGNOSIS — K589 Irritable bowel syndrome without diarrhea: Secondary | ICD-10-CM | POA: Diagnosis not present

## 2014-07-16 DIAGNOSIS — F319 Bipolar disorder, unspecified: Secondary | ICD-10-CM | POA: Diagnosis not present

## 2014-07-16 DIAGNOSIS — D72819 Decreased white blood cell count, unspecified: Secondary | ICD-10-CM | POA: Diagnosis not present

## 2014-07-16 DIAGNOSIS — G629 Polyneuropathy, unspecified: Secondary | ICD-10-CM | POA: Diagnosis not present

## 2014-07-17 DIAGNOSIS — J3089 Other allergic rhinitis: Secondary | ICD-10-CM | POA: Diagnosis not present

## 2014-07-17 DIAGNOSIS — J301 Allergic rhinitis due to pollen: Secondary | ICD-10-CM | POA: Diagnosis not present

## 2014-07-17 DIAGNOSIS — J3081 Allergic rhinitis due to animal (cat) (dog) hair and dander: Secondary | ICD-10-CM | POA: Diagnosis not present

## 2014-07-20 DIAGNOSIS — J301 Allergic rhinitis due to pollen: Secondary | ICD-10-CM | POA: Diagnosis not present

## 2014-07-20 DIAGNOSIS — J3089 Other allergic rhinitis: Secondary | ICD-10-CM | POA: Diagnosis not present

## 2014-07-20 DIAGNOSIS — F3113 Bipolar disorder, current episode manic without psychotic features, severe: Secondary | ICD-10-CM | POA: Diagnosis not present

## 2014-07-28 DIAGNOSIS — J301 Allergic rhinitis due to pollen: Secondary | ICD-10-CM | POA: Diagnosis not present

## 2014-07-28 DIAGNOSIS — J3089 Other allergic rhinitis: Secondary | ICD-10-CM | POA: Diagnosis not present

## 2014-07-28 DIAGNOSIS — J3081 Allergic rhinitis due to animal (cat) (dog) hair and dander: Secondary | ICD-10-CM | POA: Diagnosis not present

## 2014-07-31 DIAGNOSIS — J3089 Other allergic rhinitis: Secondary | ICD-10-CM | POA: Diagnosis not present

## 2014-07-31 DIAGNOSIS — J301 Allergic rhinitis due to pollen: Secondary | ICD-10-CM | POA: Diagnosis not present

## 2014-07-31 DIAGNOSIS — J3081 Allergic rhinitis due to animal (cat) (dog) hair and dander: Secondary | ICD-10-CM | POA: Diagnosis not present

## 2014-08-07 DIAGNOSIS — J3081 Allergic rhinitis due to animal (cat) (dog) hair and dander: Secondary | ICD-10-CM | POA: Diagnosis not present

## 2014-08-07 DIAGNOSIS — J3089 Other allergic rhinitis: Secondary | ICD-10-CM | POA: Diagnosis not present

## 2014-08-07 DIAGNOSIS — J301 Allergic rhinitis due to pollen: Secondary | ICD-10-CM | POA: Diagnosis not present

## 2014-08-14 DIAGNOSIS — J3089 Other allergic rhinitis: Secondary | ICD-10-CM | POA: Diagnosis not present

## 2014-08-14 DIAGNOSIS — J301 Allergic rhinitis due to pollen: Secondary | ICD-10-CM | POA: Diagnosis not present

## 2014-08-18 DIAGNOSIS — F319 Bipolar disorder, unspecified: Secondary | ICD-10-CM | POA: Diagnosis not present

## 2014-08-18 DIAGNOSIS — G43909 Migraine, unspecified, not intractable, without status migrainosus: Secondary | ICD-10-CM | POA: Diagnosis not present

## 2014-08-18 DIAGNOSIS — G629 Polyneuropathy, unspecified: Secondary | ICD-10-CM | POA: Diagnosis not present

## 2014-08-18 DIAGNOSIS — M8580 Other specified disorders of bone density and structure, unspecified site: Secondary | ICD-10-CM | POA: Diagnosis not present

## 2014-08-18 DIAGNOSIS — D472 Monoclonal gammopathy: Secondary | ICD-10-CM | POA: Diagnosis not present

## 2014-08-18 DIAGNOSIS — M7710 Lateral epicondylitis, unspecified elbow: Secondary | ICD-10-CM | POA: Diagnosis not present

## 2014-08-18 DIAGNOSIS — D72819 Decreased white blood cell count, unspecified: Secondary | ICD-10-CM | POA: Diagnosis not present

## 2014-08-18 DIAGNOSIS — M797 Fibromyalgia: Secondary | ICD-10-CM | POA: Diagnosis not present

## 2014-08-18 DIAGNOSIS — K589 Irritable bowel syndrome without diarrhea: Secondary | ICD-10-CM | POA: Diagnosis not present

## 2014-08-21 DIAGNOSIS — J3089 Other allergic rhinitis: Secondary | ICD-10-CM | POA: Diagnosis not present

## 2014-08-21 DIAGNOSIS — J301 Allergic rhinitis due to pollen: Secondary | ICD-10-CM | POA: Diagnosis not present

## 2014-08-21 DIAGNOSIS — J3081 Allergic rhinitis due to animal (cat) (dog) hair and dander: Secondary | ICD-10-CM | POA: Diagnosis not present

## 2014-08-27 DIAGNOSIS — J301 Allergic rhinitis due to pollen: Secondary | ICD-10-CM | POA: Diagnosis not present

## 2014-08-27 DIAGNOSIS — J3081 Allergic rhinitis due to animal (cat) (dog) hair and dander: Secondary | ICD-10-CM | POA: Diagnosis not present

## 2014-08-27 DIAGNOSIS — J3089 Other allergic rhinitis: Secondary | ICD-10-CM | POA: Diagnosis not present

## 2014-09-10 ENCOUNTER — Other Ambulatory Visit: Payer: Self-pay | Admitting: Gastroenterology

## 2014-09-10 DIAGNOSIS — Z86010 Personal history of colonic polyps: Secondary | ICD-10-CM | POA: Diagnosis not present

## 2014-09-10 DIAGNOSIS — K529 Noninfective gastroenteritis and colitis, unspecified: Secondary | ICD-10-CM | POA: Diagnosis not present

## 2014-09-10 DIAGNOSIS — R197 Diarrhea, unspecified: Secondary | ICD-10-CM | POA: Diagnosis not present

## 2014-09-10 DIAGNOSIS — K5289 Other specified noninfective gastroenteritis and colitis: Secondary | ICD-10-CM | POA: Diagnosis not present

## 2014-09-10 DIAGNOSIS — R109 Unspecified abdominal pain: Secondary | ICD-10-CM | POA: Diagnosis not present

## 2014-09-10 DIAGNOSIS — R110 Nausea: Secondary | ICD-10-CM | POA: Diagnosis not present

## 2014-09-17 DIAGNOSIS — J3081 Allergic rhinitis due to animal (cat) (dog) hair and dander: Secondary | ICD-10-CM | POA: Diagnosis not present

## 2014-09-17 DIAGNOSIS — J301 Allergic rhinitis due to pollen: Secondary | ICD-10-CM | POA: Diagnosis not present

## 2014-09-17 DIAGNOSIS — G43909 Migraine, unspecified, not intractable, without status migrainosus: Secondary | ICD-10-CM | POA: Diagnosis not present

## 2014-09-17 DIAGNOSIS — J3089 Other allergic rhinitis: Secondary | ICD-10-CM | POA: Diagnosis not present

## 2014-09-17 DIAGNOSIS — G629 Polyneuropathy, unspecified: Secondary | ICD-10-CM | POA: Diagnosis not present

## 2014-09-17 DIAGNOSIS — K2960 Other gastritis without bleeding: Secondary | ICD-10-CM | POA: Diagnosis not present

## 2014-09-17 DIAGNOSIS — K589 Irritable bowel syndrome without diarrhea: Secondary | ICD-10-CM | POA: Diagnosis not present

## 2014-09-17 DIAGNOSIS — D472 Monoclonal gammopathy: Secondary | ICD-10-CM | POA: Diagnosis not present

## 2014-09-17 DIAGNOSIS — M7710 Lateral epicondylitis, unspecified elbow: Secondary | ICD-10-CM | POA: Diagnosis not present

## 2014-09-17 DIAGNOSIS — M797 Fibromyalgia: Secondary | ICD-10-CM | POA: Diagnosis not present

## 2014-09-17 DIAGNOSIS — R1084 Generalized abdominal pain: Secondary | ICD-10-CM | POA: Diagnosis not present

## 2014-09-17 DIAGNOSIS — M8580 Other specified disorders of bone density and structure, unspecified site: Secondary | ICD-10-CM | POA: Diagnosis not present

## 2014-09-17 DIAGNOSIS — F319 Bipolar disorder, unspecified: Secondary | ICD-10-CM | POA: Diagnosis not present

## 2014-09-17 DIAGNOSIS — K5289 Other specified noninfective gastroenteritis and colitis: Secondary | ICD-10-CM | POA: Diagnosis not present

## 2014-09-17 DIAGNOSIS — R194 Change in bowel habit: Secondary | ICD-10-CM | POA: Diagnosis not present

## 2014-09-22 DIAGNOSIS — M899 Disorder of bone, unspecified: Secondary | ICD-10-CM | POA: Diagnosis not present

## 2014-09-22 DIAGNOSIS — M8589 Other specified disorders of bone density and structure, multiple sites: Secondary | ICD-10-CM | POA: Diagnosis not present

## 2014-09-25 DIAGNOSIS — J3089 Other allergic rhinitis: Secondary | ICD-10-CM | POA: Diagnosis not present

## 2014-09-25 DIAGNOSIS — J3081 Allergic rhinitis due to animal (cat) (dog) hair and dander: Secondary | ICD-10-CM | POA: Diagnosis not present

## 2014-09-25 DIAGNOSIS — J301 Allergic rhinitis due to pollen: Secondary | ICD-10-CM | POA: Diagnosis not present

## 2014-10-02 DIAGNOSIS — J3089 Other allergic rhinitis: Secondary | ICD-10-CM | POA: Diagnosis not present

## 2014-10-02 DIAGNOSIS — J301 Allergic rhinitis due to pollen: Secondary | ICD-10-CM | POA: Diagnosis not present

## 2014-10-09 DIAGNOSIS — J3089 Other allergic rhinitis: Secondary | ICD-10-CM | POA: Diagnosis not present

## 2014-10-09 DIAGNOSIS — J301 Allergic rhinitis due to pollen: Secondary | ICD-10-CM | POA: Diagnosis not present

## 2014-10-09 DIAGNOSIS — J3081 Allergic rhinitis due to animal (cat) (dog) hair and dander: Secondary | ICD-10-CM | POA: Diagnosis not present

## 2014-10-15 DIAGNOSIS — G43909 Migraine, unspecified, not intractable, without status migrainosus: Secondary | ICD-10-CM | POA: Diagnosis not present

## 2014-10-15 DIAGNOSIS — K589 Irritable bowel syndrome without diarrhea: Secondary | ICD-10-CM | POA: Diagnosis not present

## 2014-10-15 DIAGNOSIS — M7710 Lateral epicondylitis, unspecified elbow: Secondary | ICD-10-CM | POA: Diagnosis not present

## 2014-10-15 DIAGNOSIS — K5289 Other specified noninfective gastroenteritis and colitis: Secondary | ICD-10-CM | POA: Diagnosis not present

## 2014-10-15 DIAGNOSIS — F319 Bipolar disorder, unspecified: Secondary | ICD-10-CM | POA: Diagnosis not present

## 2014-10-15 DIAGNOSIS — M797 Fibromyalgia: Secondary | ICD-10-CM | POA: Diagnosis not present

## 2014-10-15 DIAGNOSIS — G629 Polyneuropathy, unspecified: Secondary | ICD-10-CM | POA: Diagnosis not present

## 2014-10-15 DIAGNOSIS — D472 Monoclonal gammopathy: Secondary | ICD-10-CM | POA: Diagnosis not present

## 2014-10-16 DIAGNOSIS — J3089 Other allergic rhinitis: Secondary | ICD-10-CM | POA: Diagnosis not present

## 2014-10-16 DIAGNOSIS — J3081 Allergic rhinitis due to animal (cat) (dog) hair and dander: Secondary | ICD-10-CM | POA: Diagnosis not present

## 2014-10-16 DIAGNOSIS — J301 Allergic rhinitis due to pollen: Secondary | ICD-10-CM | POA: Diagnosis not present

## 2014-10-23 DIAGNOSIS — J301 Allergic rhinitis due to pollen: Secondary | ICD-10-CM | POA: Diagnosis not present

## 2014-10-23 DIAGNOSIS — F3113 Bipolar disorder, current episode manic without psychotic features, severe: Secondary | ICD-10-CM | POA: Diagnosis not present

## 2014-10-23 DIAGNOSIS — L821 Other seborrheic keratosis: Secondary | ICD-10-CM | POA: Diagnosis not present

## 2014-10-23 DIAGNOSIS — D2272 Melanocytic nevi of left lower limb, including hip: Secondary | ICD-10-CM | POA: Diagnosis not present

## 2014-10-23 DIAGNOSIS — D225 Melanocytic nevi of trunk: Secondary | ICD-10-CM | POA: Diagnosis not present

## 2014-10-23 DIAGNOSIS — D2261 Melanocytic nevi of right upper limb, including shoulder: Secondary | ICD-10-CM | POA: Diagnosis not present

## 2014-10-23 DIAGNOSIS — D2271 Melanocytic nevi of right lower limb, including hip: Secondary | ICD-10-CM | POA: Diagnosis not present

## 2014-10-23 DIAGNOSIS — Z85828 Personal history of other malignant neoplasm of skin: Secondary | ICD-10-CM | POA: Diagnosis not present

## 2014-10-23 DIAGNOSIS — L814 Other melanin hyperpigmentation: Secondary | ICD-10-CM | POA: Diagnosis not present

## 2014-10-23 DIAGNOSIS — J3081 Allergic rhinitis due to animal (cat) (dog) hair and dander: Secondary | ICD-10-CM | POA: Diagnosis not present

## 2014-10-23 DIAGNOSIS — J3089 Other allergic rhinitis: Secondary | ICD-10-CM | POA: Diagnosis not present

## 2014-10-30 DIAGNOSIS — J3081 Allergic rhinitis due to animal (cat) (dog) hair and dander: Secondary | ICD-10-CM | POA: Diagnosis not present

## 2014-10-30 DIAGNOSIS — J301 Allergic rhinitis due to pollen: Secondary | ICD-10-CM | POA: Diagnosis not present

## 2014-10-30 DIAGNOSIS — J3089 Other allergic rhinitis: Secondary | ICD-10-CM | POA: Diagnosis not present

## 2014-11-06 DIAGNOSIS — J3081 Allergic rhinitis due to animal (cat) (dog) hair and dander: Secondary | ICD-10-CM | POA: Diagnosis not present

## 2014-11-06 DIAGNOSIS — J3089 Other allergic rhinitis: Secondary | ICD-10-CM | POA: Diagnosis not present

## 2014-11-06 DIAGNOSIS — J301 Allergic rhinitis due to pollen: Secondary | ICD-10-CM | POA: Diagnosis not present

## 2014-11-11 DIAGNOSIS — B349 Viral infection, unspecified: Secondary | ICD-10-CM | POA: Diagnosis not present

## 2014-11-23 DIAGNOSIS — K2960 Other gastritis without bleeding: Secondary | ICD-10-CM | POA: Diagnosis not present

## 2014-11-23 DIAGNOSIS — K5289 Other specified noninfective gastroenteritis and colitis: Secondary | ICD-10-CM | POA: Diagnosis not present

## 2014-11-23 DIAGNOSIS — R110 Nausea: Secondary | ICD-10-CM | POA: Diagnosis not present

## 2014-11-24 DIAGNOSIS — D472 Monoclonal gammopathy: Secondary | ICD-10-CM | POA: Diagnosis not present

## 2014-11-24 DIAGNOSIS — G629 Polyneuropathy, unspecified: Secondary | ICD-10-CM | POA: Diagnosis not present

## 2014-11-24 DIAGNOSIS — Z23 Encounter for immunization: Secondary | ICD-10-CM | POA: Diagnosis not present

## 2014-11-24 DIAGNOSIS — M797 Fibromyalgia: Secondary | ICD-10-CM | POA: Diagnosis not present

## 2014-11-24 DIAGNOSIS — M7710 Lateral epicondylitis, unspecified elbow: Secondary | ICD-10-CM | POA: Diagnosis not present

## 2014-11-24 DIAGNOSIS — K5289 Other specified noninfective gastroenteritis and colitis: Secondary | ICD-10-CM | POA: Diagnosis not present

## 2014-11-24 DIAGNOSIS — F3181 Bipolar II disorder: Secondary | ICD-10-CM | POA: Diagnosis not present

## 2014-11-24 DIAGNOSIS — G43909 Migraine, unspecified, not intractable, without status migrainosus: Secondary | ICD-10-CM | POA: Diagnosis not present

## 2014-11-24 DIAGNOSIS — K589 Irritable bowel syndrome without diarrhea: Secondary | ICD-10-CM | POA: Diagnosis not present

## 2014-11-26 DIAGNOSIS — J3089 Other allergic rhinitis: Secondary | ICD-10-CM | POA: Diagnosis not present

## 2014-11-26 DIAGNOSIS — J301 Allergic rhinitis due to pollen: Secondary | ICD-10-CM | POA: Diagnosis not present

## 2014-11-26 DIAGNOSIS — J3081 Allergic rhinitis due to animal (cat) (dog) hair and dander: Secondary | ICD-10-CM | POA: Diagnosis not present

## 2014-12-03 DIAGNOSIS — J3089 Other allergic rhinitis: Secondary | ICD-10-CM | POA: Diagnosis not present

## 2014-12-03 DIAGNOSIS — J301 Allergic rhinitis due to pollen: Secondary | ICD-10-CM | POA: Diagnosis not present

## 2014-12-03 DIAGNOSIS — J3081 Allergic rhinitis due to animal (cat) (dog) hair and dander: Secondary | ICD-10-CM | POA: Diagnosis not present

## 2014-12-08 ENCOUNTER — Ambulatory Visit: Payer: Medicare Other | Attending: Family Medicine | Admitting: Physical Therapy

## 2014-12-08 DIAGNOSIS — M25521 Pain in right elbow: Secondary | ICD-10-CM | POA: Insufficient documentation

## 2014-12-08 DIAGNOSIS — R29898 Other symptoms and signs involving the musculoskeletal system: Secondary | ICD-10-CM | POA: Insufficient documentation

## 2014-12-10 ENCOUNTER — Ambulatory Visit: Payer: Medicare Other

## 2014-12-11 ENCOUNTER — Encounter: Payer: Medicare Other | Admitting: Physical Therapy

## 2014-12-11 DIAGNOSIS — J3081 Allergic rhinitis due to animal (cat) (dog) hair and dander: Secondary | ICD-10-CM | POA: Diagnosis not present

## 2014-12-11 DIAGNOSIS — J3089 Other allergic rhinitis: Secondary | ICD-10-CM | POA: Diagnosis not present

## 2014-12-11 DIAGNOSIS — J301 Allergic rhinitis due to pollen: Secondary | ICD-10-CM | POA: Diagnosis not present

## 2014-12-18 DIAGNOSIS — J3089 Other allergic rhinitis: Secondary | ICD-10-CM | POA: Diagnosis not present

## 2014-12-18 DIAGNOSIS — J301 Allergic rhinitis due to pollen: Secondary | ICD-10-CM | POA: Diagnosis not present

## 2014-12-18 DIAGNOSIS — J3081 Allergic rhinitis due to animal (cat) (dog) hair and dander: Secondary | ICD-10-CM | POA: Diagnosis not present

## 2014-12-18 DIAGNOSIS — F3113 Bipolar disorder, current episode manic without psychotic features, severe: Secondary | ICD-10-CM | POA: Diagnosis not present

## 2014-12-21 ENCOUNTER — Ambulatory Visit: Payer: Medicare Other

## 2014-12-21 DIAGNOSIS — Z0000 Encounter for general adult medical examination without abnormal findings: Secondary | ICD-10-CM | POA: Diagnosis not present

## 2014-12-21 DIAGNOSIS — K589 Irritable bowel syndrome without diarrhea: Secondary | ICD-10-CM | POA: Diagnosis not present

## 2014-12-21 DIAGNOSIS — E7800 Pure hypercholesterolemia, unspecified: Secondary | ICD-10-CM | POA: Diagnosis not present

## 2014-12-21 DIAGNOSIS — F411 Generalized anxiety disorder: Secondary | ICD-10-CM | POA: Diagnosis not present

## 2014-12-21 DIAGNOSIS — G629 Polyneuropathy, unspecified: Secondary | ICD-10-CM | POA: Diagnosis not present

## 2014-12-21 DIAGNOSIS — M25521 Pain in right elbow: Secondary | ICD-10-CM | POA: Diagnosis not present

## 2014-12-21 DIAGNOSIS — G43909 Migraine, unspecified, not intractable, without status migrainosus: Secondary | ICD-10-CM | POA: Diagnosis not present

## 2014-12-21 DIAGNOSIS — F3181 Bipolar II disorder: Secondary | ICD-10-CM | POA: Diagnosis not present

## 2014-12-21 DIAGNOSIS — D72819 Decreased white blood cell count, unspecified: Secondary | ICD-10-CM | POA: Diagnosis not present

## 2014-12-21 DIAGNOSIS — R29898 Other symptoms and signs involving the musculoskeletal system: Secondary | ICD-10-CM

## 2014-12-21 DIAGNOSIS — D472 Monoclonal gammopathy: Secondary | ICD-10-CM | POA: Diagnosis not present

## 2014-12-21 DIAGNOSIS — K5289 Other specified noninfective gastroenteritis and colitis: Secondary | ICD-10-CM | POA: Diagnosis not present

## 2014-12-21 DIAGNOSIS — J45909 Unspecified asthma, uncomplicated: Secondary | ICD-10-CM | POA: Diagnosis not present

## 2014-12-21 DIAGNOSIS — M797 Fibromyalgia: Secondary | ICD-10-CM | POA: Diagnosis not present

## 2014-12-21 NOTE — Therapy (Signed)
[facility] [address], [address] [place], [place], [address] Phone: [phone]   Fax:  [phone]  Physical Therapy Evaluation  Patient Details  Name: Danielle Bruce MRN: [id number] Date of Birth: 05/20/1960 Referring Provider: Shaw, Kimberlee, MD  Encounter Date: 12/21/2014      PT End of Session - 12/21/14 1529    Visit Number 1   Number of Visits 10   Date for PT Re-Evaluation 02/15/15   PT Start Time 1451   PT Stop Time 1526   PT Time Calculation (min) 35 min   Activity Tolerance Patient tolerated treatment well   Behavior During Therapy WFL for tasks assessed/performed      Past Medical History  Diagnosis Date  . GERD (gastroesophageal reflux disease)   . 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 Migraines   . Bipolar 2 disorder (HCC)   . IBS (irritable bowel syndrome)   . Constipation   . ASCUS (atypical squamous cells of undetermined significance) on Pap smear 05/06/05    NEG HIGH RISK HPV--C&B BIOPSY BENIGN 12/2005  . Anxiety   . 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    cytology negative  . Fibromyalgia   . 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    Bone marrow biopsy showes 8% plasma cells IgA Lambda  . Fibromyalgia 10/2013    Past Surgical History  Procedure Laterality Date  . Hemorrhoid surgery  1993    x3  . Cesarean section  86,88,93  . Shoulder surgery  2007/2008  . Spine surgery  2010    cervical  . Dilation and curettage of uterus    . Pelvic laparoscopy    . Intrauterine device insertion  01/04/2010    MIRENA  . Laparoscopy N/A 10/16/2012    Procedure: LAPAROSCOPY DIAGNOSTIC;  Surgeon: Thomas A. Cornett, MD;  Location: [facility];  Service: General;  Laterality: N/A;  . Cholecystectomy N/A 10/16/2012    Procedure: LAPAROSCOPIC CHOLECYSTECTOMY;  Surgeon: Thomas A. Cornett, MD;  Location: [facility];  Service: General;  Laterality: N/A;  . Laparoscopic lysis of adhesions N/A 10/16/2012     Procedure: LAPAROSCOPIC LYSIS OF ADHESIONS;  Surgeon: Thomas A. Cornett, MD;  [facility]: [facility];  Service: General;  Laterality: N/A;  . Bone marrow biopsy Left 12/18/2013    Plasma cell dyscrasia 8% population of plasma cells    There were no vitals filed for this visit.  Visit Diagnosis:  Elbow pain, right - Plan: PT plan of care cert/re-cert  Weakness of wrist - Plan: PT plan of care cert/re-cert      Subjective Assessment - 12/21/14 1454    Subjective Pt presents to PT with complaints of Rt elbow pain of a chronic nature.  Pt is Rt hand dominant.  Pt was taking steroids for another medical condition and pain was reduced during this time.  Pt reports that elbow pain has returned now that she is off the medication.   Pertinent History fibromyalgia   Patient Stated Goals reduce Rt eblow pain, use Rt UE more   Currently in Pain? 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  No   Is the patient reluctant to leave their home because of a fear of falling?  No   Home Environment   Living Environment Private residence   Living Arrangements Other relatives   Type of Home House   Prior Function   Level  of Independence Independent   Vocation On disability   Vocation Requirements none   Cognition   Overall Cognitive Status Within Functional Limits for tasks assessed   Observation/Other Assessments   Focus on Therapeutic Outcomes (FOTO)  58% limitation   Posture/Postural Control   Posture/Postural Control No significant limitations   ROM / Strength   AROM / PROM / Strength AROM;PROM;Strength   AROM   Overall AROM  Within functional limits for tasks performed   Overall AROM Comments Pt demonstrates full Rt elbow and wrist AROM and PROM.  Pt with pain with wrist extension and supination on the Rt   Strength   Overall Strength Deficits   Overall Strength Comments Rt wrist strength 4-/5, elbow strength 4/5, shoulder flexion 4+/5   Strength Assessment Site Hand   Right/Left hand Right;Left   Right Hand Grip (lbs) 20   Left Hand Grip (lbs) 42   Palpation   Palpation comment Pt with palpable tenderness over Rt lateral epicondyle and proximal wrist extensors                   [facility] Adult PT Treatment/Exercise - 12/21/14 0001    Modalities   Modalities Iontophoresis   Iontophoresis   Type of Iontophoresis Dexamethasone  #1   Location Rt elbow at lateral epicondyle   Dose 1.0 ml   Time 6 hour wear time                PT Education - 12/21/14 1525    Education provided Yes   Education Details HEP and ionto instructions   Person(s) Educated Patient   Methods Explanation;Demonstration;Handout   Comprehension Verbalized understanding;Returned demonstration          PT Short Term Goals - 12/21/14 1536    PT SHORT TERM GOAL #1   Title be independent in initial HEP   Time 4   Period Weeks   Status New   PT SHORT TERM GOAL #2   Title report 30% reduciton in Rt elbow pain with use   Time 4   Period Weeks   Status New   PT SHORT TERM GOAL #3   Title demonstrate 30# Rt grip strength to improve use of Rt UE   Time 4   Period Weeks   Status New            PT Long Term Goals - 12/21/14 1449    PT LONG TERM GOAL #1   Title be independent in advanced HEP   Time 8   Period Weeks   Status New  PT LONG TERM GOAL #2   Title reduce FOTO to < or = to 37% limitation   Time 8   Period Weeks   Status New   PT LONG TERM GOAL #3   Title report a 60% reduction in Rt elbow pain with use   Time 8   Period Weeks   Status New   PT LONG TERM GOAL #4   Title demonstrate 4 to 4+/5 Rt elbow and wrist strength to improve endurance with use   Time 8   Period Weeks   Status New   PT LONG TERM GOAL #5   Title demonstrate 40# Rt grip strength to imrpove use of Rt hand   Time 8   Period Weeks   Status New               Plan - 12/21/14 1530    Clinical Impression Statement Pt presents to PT with 6 month history of Rt elbow pain.  Pt was taking oral steroids for another medical condition and had significant reduction in her elbow pain during that time.  Pt reports that pain has now returned.  Pt demonstrates painful Rt elbow and wrist AROM, weakness in wrist/elbow/hand and FOTO score of 58% limitation .  Pt with localized palpable tenderness over Rt proximal wrist extensor tendons.  Pt will benefit from iontophoresis for inflammation, modalities, manual and gentle flexiblity to reduce pain and allow for return to regular function.     Pt will benefit from skilled therapeutic intervention in order to improve on the following deficits Decreased strength;Decreased activity tolerance;Pain;Impaired UE functional use   Rehab Potential Good   PT Frequency 2x / week   PT Duration 8 weeks   PT Treatment/Interventions ADLs/Self Care Home Management;Cryotherapy;Electrical Stimulation;Iontophoresis 4mg/ml Dexamethasone;Moist Heat;Therapeutic exercise;Therapeutic activities;Neuromuscular re-education;Passive range of motion;Dry needling   PT Next Visit Plan continue ionto.  Ultrasound, cross friction as tolerated, ice probe, gentle flexibility   Consulted and  Agree with Plan of Care Patient          G-Codes - 12/21/14 1450    Functional Assessment Tool Used FOTO: 58% limitation   Functional Limitation Other PT primary   Other PT Primary Current Status (G8990) At least 40 percent but less than 60 percent impaired, limited or restricted   Other PT Primary Goal Status (G8991) At least 20 percent but less than 40 percent impaired, limited or restricted       Problem List Patient Active Problem List   Diagnosis Date Noted  . MGUS (monoclonal gammopathy of unknown significance) 12/23/2013  . Chronic cholecystitis without calculus 10/18/2012  . Abdominal pain, unspecified site 10/18/2012  . Nausea alone 10/18/2012  . Unspecified constipation 10/18/2012  . Depression 10/18/2012  . Bipolar 2 disorder (HCC) 10/18/2012  . Anxiety   . ASCUS (atypical squamous cells of undetermined significance) on Pap smear   . IUD   . Hemorrhoids 11/29/2010  . Abdominal pain, left upper quadrant 11/29/2010    TAKACS,KELLY, PT 12/21/2014, 3:42 PM  [facility] [address], [address] [place], [place], [address] Phone: [phone]   Fax:  [phone]  Name: Jean M Hoon MRN: [id number] Date of Birth: 10/11/1960    

## 2014-12-21 NOTE — Patient Instructions (Signed)
AROM: Elbow Flexion / Extension   With left hand palm up, gently bend elbow as far as possible. Then straighten arm as far as possible. Repeat _10___ times per set. Do ____ sets per session. Do __3__ sessions per day.   Wrist Extensor Stretch   Keeping elbow straight, grasp left hand and slowly bend wrist forward until stretch is felt. Hold __10-20__ seconds. Relax. Repeat __3__ times per set. Do ____ sets per session. Do __3__ sessions per day.  Copyright  VHI. All rights reserved.  Elbow / Wrist Supination / Pronation    Bend elbows to 90 and hold close to body. Turn both palms up. Then turn palms down. Keep wrist straight. Repeat sequence _10___ times per session. Do _3___ sessions per week.  Copyright  VHI. All rights reserved.  IONTOPHORESIS PATIENT PRECAUTIONS & CONTRAINDICATIONS:  . Redness under one or both electrodes can occur.  This characterized by a uniform redness that usually disappears within 12 hours of treatment. . Small pinhead size blisters may result in response to the drug.  Contact your physician if the problem persists more than 24 hours. . On rare occasions, iontophoresis therapy can result in temporary skin reactions such as rash, inflammation, irritation or burns.  The skin reactions may be the result of individual sensitivity to the ionic solution used, the condition of the skin at the start of treatment, reaction to the materials in the electrodes, allergies or sensitivity to dexamethasone, or a poor connection between the patch and your skin.  Discontinue using iontophoresis if you have any of these reactions and report to your therapist. . Remove the Patch or electrodes if you have any undue sensation of pain or burning during the treatment and report discomfort to your therapist. . Tell your Therapist if you have had known adverse reactions to the application of electrical current. . If using the Patch, the LED light will turn off when treatment is  complete and the patch can be removed.  Approximate treatment time is 1-3 hours.  Remove the patch when light goes off or after 6 hours. . The Patch can be worn during normal activity, however excessive motion where the electrodes have been placed can cause poor contact between the skin and the electrode or uneven electrical current resulting in greater risk of skin irritation. Marland Kitchen Keep out of the reach of children.   . DO NOT use if you have a cardiac pacemaker or any other electrically sensitive implanted device. . DO NOT use if you have a known sensitivity to dexamethasone. . DO NOT use during Magnetic Resonance Imaging (MRI). . DO NOT use over broken or compromised skin (e.g. sunburn, cuts, or acne) due to the increased risk of skin reaction. . DO NOT SHAVE over the area to be treated:  To establish good contact between the Patch and the skin, excessive hair may be clipped. . DO NOT place the Patch or electrodes on or over your eyes, directly over your heart, or brain. . DO NOT reuse the Patch or electrodes as this may cause burns to occur. [facility] [address] [place], [place] [address] Phone # [phone] Fax [phone]

## 2014-12-23 ENCOUNTER — Encounter: Payer: Self-pay | Admitting: Physical Therapy

## 2014-12-23 ENCOUNTER — Ambulatory Visit: Payer: Medicare Other | Admitting: Physical Therapy

## 2014-12-23 DIAGNOSIS — R29898 Other symptoms and signs involving the musculoskeletal system: Secondary | ICD-10-CM | POA: Diagnosis not present

## 2014-12-23 DIAGNOSIS — M25521 Pain in right elbow: Secondary | ICD-10-CM

## 2014-12-23 DIAGNOSIS — J3089 Other allergic rhinitis: Secondary | ICD-10-CM | POA: Diagnosis not present

## 2014-12-23 DIAGNOSIS — J3081 Allergic rhinitis due to animal (cat) (dog) hair and dander: Secondary | ICD-10-CM | POA: Diagnosis not present

## 2014-12-23 DIAGNOSIS — J301 Allergic rhinitis due to pollen: Secondary | ICD-10-CM | POA: Diagnosis not present

## 2014-12-23 NOTE — Therapy (Addendum)
[facility] [address] [place], [place], [address] Phone: [phone]   Fax:  [phone]  Physical Therapy Treatment  Patient Details  Name: Danielle Bruce MRN: [id number] Date of Birth: 03/19/1960 Referring Provider: Mayra Neer, MD  Encounter Date: 12/23/2014  Visit number                            2 Date of Re-evaluation              01/29/15   Past Medical History  Diagnosis Date  . GERD (gastroesophageal reflux disease)   . 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 Migraines   . Bipolar 2 disorder ([place])   . IBS (irritable bowel syndrome)   . Constipation   . ASCUS (atypical squamous cells of undetermined significance) on Pap smear 05/06/05    NEG HIGH RISK HPV--C&B BIOPSY BENIGN 12/2005  . Anxiety   . 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    cytology negative  . Fibromyalgia   . 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    Bone marrow biopsy showes 8% plasma cells IgA Lambda  . Fibromyalgia 10/2013    Past Surgical History  Procedure Laterality Date  . Hemorrhoid surgery  1993    x3  . Cesarean section  [id number]  . Shoulder surgery  2007/2008  . Spine surgery  2010    cervical  . Dilation and curettage of uterus    . Pelvic laparoscopy    . Intrauterine device insertion  01/04/2010    MIRENA  . Laparoscopy N/A 10/16/2012    Procedure: LAPAROSCOPY DIAGNOSTIC;  Surgeon: Joyice Faster. Cornett, MD;  [facility]: [facility];  Service: General;  Laterality: N/A;  . Cholecystectomy N/A 10/16/2012    Procedure: LAPAROSCOPIC CHOLECYSTECTOMY;  Surgeon: Joyice Faster. Cornett, MD;  [facility];  Service: General;  Laterality: N/A;  . Laparoscopic lysis of adhesions N/A 10/16/2012    Procedure: LAPAROSCOPIC LYSIS OF ADHESIONS;  Surgeon: Joyice Faster. Cornett, MD;  [facility];  Service: General;  Laterality: N/A;  . Bone marrow biopsy Left 12/18/2013    Plasma cell dyscrasia 8% population of plasma cells     There were no vitals filed for this visit.  Visit Diagnosis:  Elbow pain, right  Weakness of wrist      Subjective Assessment - 12/23/14 1159    Subjective Pt continues to complain of pain in right elbow rated as 4/10. Pt with increased pain with supination and pronation. After evaluation it was hurting more due to increased movement in right elbow   Pertinent History fibromyalgia, history of 6 month of Rt elbow pain. Pt was taking pretnizone for different condition for 2 month what helped the elbow. FOTO 58% on 12/21/2014.   Currently in Pain? Yes   Pain Score 4    Pain Location Elbow   Pain Orientation Right   Pain Descriptors / Indicators Aching   Pain Type Chronic pain   Pain Onset More than a month ago   Multiple Pain Sites No                         [facility] Adult PT Treatment/Exercise - 12/23/14 0001    Exercises   Exercises Elbow   Elbow Exercises   Elbow Flexion AROM;20 reps   Forearm Supination AROM;20 reps   Modalities   Modalities Ultrasound   Ultrasound  Ultrasound Location Rt lat elbow    Ultrasound Parameters 50%, 1MHz, 1Wcm  x 8 min   Ultrasound Goals Pain   Iontophoresis   Type of Iontophoresis Dexamethasone   Location Rt elbow at lateral epicondyle   Dose 1.0 ml   Time 6 hour wear time   Manual Therapy   Manual Therapy Soft tissue mobilization   Manual therapy comments gentle STW to Rt elbow and forearm with PROM for elongation   Soft tissue mobilization gentle MFR Rt forearm                  PT Short Term Goals - 12/21/14 1536    PT SHORT TERM GOAL #1   Title be independent in initial HEP   Time 4   Period Weeks   Status New   PT SHORT TERM GOAL #2   Title report 30% reduciton in Rt elbow pain with use   Time 4   Period Weeks   Status New   PT SHORT TERM GOAL #3   Title demonstrate 30# Rt grip strength to improve use of Rt UE   Time 4   Period Weeks   Status New           PT Long Term Goals - 12/21/14  1449    PT LONG TERM GOAL #1   Title be independent in advanced HEP   Time 8   Period Weeks   Status New   PT LONG TERM GOAL #2   Title reduce FOTO to < or = to 37% limitation   Time 8   Period Weeks   Status New   PT LONG TERM GOAL #3   Title report a 60% reduction in Rt elbow pain with use   Time 8   Period Weeks   Status New   PT LONG TERM GOAL #4   Title demonstrate 4 to 4+/5 Rt elbow and wrist strength to improve endurance with use   Time 8   Period Weeks   Status New   PT LONG TERM GOAL #5   Title demonstrate 40# Rt grip strength to imrpove use of Rt hand   Time 8   Period Weeks   Status New               Problem List Patient Active Problem List   Diagnosis Date Noted  . MGUS (monoclonal gammopathy of unknown significance) 12/23/2013  . Chronic cholecystitis without calculus 10/18/2012  . Abdominal pain, unspecified site 10/18/2012  . Nausea alone 10/18/2012  . Unspecified constipation 10/18/2012  . Depression 10/18/2012  . Bipolar 2 disorder ([place]) 10/18/2012  . Anxiety   . ASCUS (atypical squamous cells of undetermined significance) on Pap smear   . IUD   . Hemorrhoids 11/29/2010  . Abdominal pain, left upper quadrant 11/29/2010    NAUMANN-HOUEGNIFIO,Sreshta Cressler PTA 12/23/2014, 12:41 PM  [facility] [address], [place], [place], [address] Phone: [phone]   Fax:  [phone]  Name: Danielle Bruce MRN: [id number] Date of Birth: 05-Oct-1960

## 2014-12-28 ENCOUNTER — Encounter: Payer: Medicare Other | Admitting: Physical Therapy

## 2014-12-31 ENCOUNTER — Ambulatory Visit: Payer: Medicare Other | Attending: Family Medicine | Admitting: Physical Therapy

## 2014-12-31 ENCOUNTER — Encounter: Payer: Self-pay | Admitting: Physical Therapy

## 2014-12-31 DIAGNOSIS — J3081 Allergic rhinitis due to animal (cat) (dog) hair and dander: Secondary | ICD-10-CM | POA: Diagnosis not present

## 2014-12-31 DIAGNOSIS — J3089 Other allergic rhinitis: Secondary | ICD-10-CM | POA: Diagnosis not present

## 2014-12-31 DIAGNOSIS — R29898 Other symptoms and signs involving the musculoskeletal system: Secondary | ICD-10-CM | POA: Diagnosis not present

## 2014-12-31 DIAGNOSIS — J301 Allergic rhinitis due to pollen: Secondary | ICD-10-CM | POA: Diagnosis not present

## 2014-12-31 DIAGNOSIS — M25521 Pain in right elbow: Secondary | ICD-10-CM | POA: Diagnosis not present

## 2014-12-31 NOTE — Therapy (Signed)
[facility] [address] [place], [place], [address] Phone: [phone]   Fax:  [phone]  Physical Therapy Treatment  Patient Details  Name: Danielle Bruce MRN: [id number] Date of Birth: Aug 02, 1960 Referring Provider: Mayra Neer, MD  Encounter Date: 12/31/2014      PT End of Session - 12/31/14 1533    Visit Number 3   Number of Visits 10   Date for PT Re-Evaluation 02/15/15   PT Start Time 3664   PT Stop Time 1529   PT Time Calculation (min) 42 min   Activity Tolerance Patient tolerated treatment well   Behavior During Therapy [facility] for tasks assessed/performed      Past Medical History  Diagnosis Date  . GERD (gastroesophageal reflux disease)   . 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 Migraines   . Bipolar 2 disorder ([place])   . IBS (irritable bowel syndrome)   . Constipation   . ASCUS (atypical squamous cells of undetermined significance) on Pap smear 05/06/05    NEG HIGH RISK HPV--C&B BIOPSY BENIGN 12/2005  . Anxiety   . 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    cytology negative  . Fibromyalgia   . 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    Bone marrow biopsy showes 8% plasma cells IgA Lambda  . Fibromyalgia 10/2013    Past Surgical History  Procedure Laterality Date  . Hemorrhoid surgery  1993    x3  . Cesarean section  [id number]  . Shoulder surgery  2007/2008  . Spine surgery  2010    cervical  . Dilation and curettage of uterus    . Pelvic laparoscopy    . Intrauterine device insertion  01/04/2010    MIRENA  . Laparoscopy N/A 10/16/2012    Procedure: LAPAROSCOPY DIAGNOSTIC;  Surgeon: Joyice Faster. Cornett, MD;  [facility];  Service: General;  Laterality: N/A;  . Cholecystectomy N/A 10/16/2012    Procedure: LAPAROSCOPIC CHOLECYSTECTOMY;  Surgeon: Joyice Faster. Cornett, MD;  [facility];  Service: General;  Laterality: N/A;  . Laparoscopic lysis of adhesions N/A 10/16/2012   Procedure: LAPAROSCOPIC LYSIS OF ADHESIONS;  Surgeon: Joyice Faster. Cornett, MD;  [facility];  Service: General;  Laterality: N/A;  . Bone marrow biopsy Left 12/18/2013    Plasma cell dyscrasia 8% population of plasma cells    There were no vitals filed for this visit.  Visit Diagnosis:  Elbow pain, right  Weakness of wrist      Subjective Assessment - 12/31/14 1448    Subjective Pt continues to complain of right elbow rated as 2-3/10 up to 4/03 with certain activities.    Pertinent History fibromyalgia, history of 6 month of Rt elbow pain. Pt was taking pretnizone for different condition for 2 month what helped the elbow. FOTO 58% on 12/21/2014.   Currently in Pain? Yes   Pain Score 2    Pain Location Elbow   Pain Orientation Right   Pain Descriptors / Indicators Aching;Sharp   Pain Type Chronic pain   Pain Onset More than a month ago   Pain Frequency Intermittent   Multiple Pain Sites No                         [facility] Adult PT Treatment/Exercise - 12/31/14 0001    Exercises   Exercises Elbow;Shoulder   Elbow Exercises   Elbow Flexion AROM;20 reps;10 reps;Seated;Bar weights/barbell   Bar Weights/Barbell (Elbow Flexion)  2 lbs   Forearm Supination AROM;20 reps;10 reps  and x 30 with yellow t-band    Theraband Level (Supination) Level 1 (Yellow)   Shoulder Exercises: Pulleys   Flexion 3 minutes   Modalities   Modalities Ultrasound   Ultrasound   Ultrasound Location Rt lat elbow   Ultrasound Parameters 50%   Ultrasound Goals Pain   Iontophoresis   Type of Iontophoresis Dexamethasone   Location # 3 Rt elbow at lateral epicondyle   Dose  1.0 ml   Time 6 hour wear time   Manual Therapy   Manual Therapy Soft tissue mobilization   Manual therapy comments gentle STW to Rt elbow and forearm with PROM for elongation   Soft tissue mobilization gentle MFR Rt forearm                PT Education - 12/31/14 1533    Education provided Yes   Education  Details supination with yellow t-band,, and verbal review of ionto precautions   Person(s) Educated Patient   Methods Explanation;Demonstration;Verbal cues   Comprehension Returned demonstration;Verbalized understanding          PT Short Term Goals - 12/31/14 1530    PT SHORT TERM GOAL #1   Title be independent in initial HEP   Time 4   Period Weeks   Status On-going   PT SHORT TERM GOAL #2   Title report 30% reduciton in Rt elbow pain with use   Time 4   Period Weeks   Status On-going   PT SHORT TERM GOAL #3   Title demonstrate 30# Rt grip strength to improve use of Rt UE   Time 4   Period Weeks   Status On-going           PT Long Term Goals - 12/21/14 1449    PT LONG TERM GOAL #1   Title be independent in advanced HEP   Time 8   Period Weeks   Status New   PT LONG TERM GOAL #2   Title reduce FOTO to < or = to 37% limitation   Time 8   Period Weeks   Status New   PT LONG TERM GOAL #3   Title report a 60% reduction in Rt elbow pain with use   Time 8   Period Weeks   Status New   PT LONG TERM GOAL #4   Title demonstrate 4 to 4+/5 Rt elbow and wrist strength to improve endurance with use   Time 8   Period Weeks   Status New   PT LONG TERM GOAL #5   Title demonstrate 40# Rt grip strength to imrpove use of Rt hand   Time 8   Period Weeks   Status New               Plan - 12/31/14 1534    Clinical Impression Statement Pt was able to tolerate weightbearing activities with Rt elbow, less palpaple tenderness in Rt elbow and forearm. Pt will continue to benefit from skilled PT and Ionto   PT Next Visit Plan Ionto #4, Ultrasound, crossfriction as tolerated, gentle flexibility and gentle strength   Consulted and Agree with Plan of Care Patient        Problem List Patient Active Problem List   Diagnosis Date Noted  . MGUS (monoclonal gammopathy of unknown significance) 12/23/2013  . Chronic cholecystitis without calculus 10/18/2012  . Abdominal  pain, unspecified site 10/18/2012  . Nausea alone 10/18/2012  .  Unspecified constipation 10/18/2012  . Depression 10/18/2012  . Bipolar 2 disorder ([place]) 10/18/2012  . Anxiety   . ASCUS (atypical squamous cells of undetermined significance) on Pap smear   . IUD   . Hemorrhoids 11/29/2010  . Abdominal pain, left upper quadrant 11/29/2010    NAUMANN-HOUEGNIFIO,Devonta Blanford PTA 12/31/2014, 5:24 PM   [facility] [address] [place], [place], [address] Phone: [phone]   Fax:  [phone]  Name: Danielle Bruce MRN: [id number] Date of Birth: 08/27/1960

## 2014-12-31 NOTE — Patient Instructions (Signed)

## 2015-01-01 DIAGNOSIS — M170 Bilateral primary osteoarthritis of knee: Secondary | ICD-10-CM | POA: Diagnosis not present

## 2015-01-01 DIAGNOSIS — M797 Fibromyalgia: Secondary | ICD-10-CM | POA: Diagnosis not present

## 2015-01-01 DIAGNOSIS — M19271 Secondary osteoarthritis, right ankle and foot: Secondary | ICD-10-CM | POA: Diagnosis not present

## 2015-01-01 DIAGNOSIS — G4709 Other insomnia: Secondary | ICD-10-CM | POA: Diagnosis not present

## 2015-01-04 ENCOUNTER — Encounter: Payer: Self-pay | Admitting: Physical Therapy

## 2015-01-04 ENCOUNTER — Ambulatory Visit: Payer: Medicare Other | Admitting: Physical Therapy

## 2015-01-04 DIAGNOSIS — M25521 Pain in right elbow: Secondary | ICD-10-CM

## 2015-01-04 DIAGNOSIS — R29898 Other symptoms and signs involving the musculoskeletal system: Secondary | ICD-10-CM | POA: Diagnosis not present

## 2015-01-04 NOTE — Therapy (Signed)
[facility] [address] [place], [place], [address] Phone: [phone]   Fax:  [phone]  Physical Therapy Treatment  Patient Details  Name: Danielle Bruce MRN: [id number] Date of Birth: [birth year] Referring Provider: Mayra Neer, MD  Encounter Date: 01/04/2015      PT End of Session - 01/04/15 1538    Visit Number 4   Number of Visits 10   Date for PT Re-Evaluation 02/15/15   PT Start Time 3149  pt arrived late   PT Stop Time 1530   PT Time Calculation (min) 35 min   Activity Tolerance Patient tolerated treatment well   Behavior During Therapy [facility] for tasks assessed/performed      Past Medical History  Diagnosis Date  . GERD (gastroesophageal reflux disease)   . 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 Migraines   . Bipolar 2 disorder ([place])   . IBS (irritable bowel syndrome)   . Constipation   . ASCUS (atypical squamous cells of undetermined significance) on Pap smear 05/06/05    NEG HIGH RISK HPV--C&B BIOPSY BENIGN 12/2005  . Anxiety   . 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    cytology negative  . Fibromyalgia   . 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    Bone marrow biopsy showes 8% plasma cells IgA Lambda  . Fibromyalgia 10/2013    Past Surgical History  Procedure Laterality Date  . Hemorrhoid surgery  1993    x3  . Cesarean section  [id number]  . Shoulder surgery  2007/2008  . Spine surgery  2010    cervical  . Dilation and curettage of uterus    . Pelvic laparoscopy    . Intrauterine device insertion  01/04/2010    MIRENA  . Laparoscopy N/A 10/16/2012    Procedure: LAPAROSCOPY DIAGNOSTIC;  Surgeon: Joyice Faster. Cornett, MD;  Location: [facility];  Service: General;  Laterality: N/A;  . Cholecystectomy N/A 10/16/2012    Procedure: LAPAROSCOPIC CHOLECYSTECTOMY;  Surgeon: Joyice Faster. Cornett, MD;  Location: [facility];  Service: General;  Laterality: N/A;  . Laparoscopic lysis of adhesions N/A  10/16/2012    Procedure: LAPAROSCOPIC LYSIS OF ADHESIONS;  Surgeon: Joyice Faster. Cornett, MD;  [facility]: [facility];  Service: General;  Laterality: N/A;  . Bone marrow biopsy Left 12/18/2013    Plasma cell dyscrasia 8% population of plasma cells    There were no vitals filed for this visit.  Visit Diagnosis:  Elbow pain, right  Weakness of wrist      Subjective Assessment - 01/04/15 1456    Subjective Pt complains of pain in right elbow as 1/10, this week end I was very busty, the elbow is not hurting all the time   Pertinent History fibromyalgia, history of 6 month of Rt elbow pain. Pt was taking pretnizone for different condition for 2 month what helped the elbow. FOTO 58% on 12/21/2014.   Patient Stated Goals reduce Rt eblow pain, use Rt UE more   Currently in Pain? Yes   Pain Score 1    Pain Location Elbow   Pain Orientation Right   Pain Descriptors / Indicators Aching;Sharp   Pain Type Chronic pain   Pain Onset More than a month ago   Pain Frequency Intermittent   Multiple Pain Sites No                         [facility] Adult PT Treatment/Exercise - 01/04/15 0001  Posture/Postural Control   Posture/Postural Control No significant limitations   Exercises   Exercises Elbow;Shoulder   Shoulder Exercises: Pulleys   Flexion 3 minutes   Modalities   Modalities Ultrasound   Ultrasound   Ultrasound Location Rt lat elbow   Ultrasound Parameters 50%   Ultrasound Goals Pain   Iontophoresis   Type of Iontophoresis Dexamethasone   Location #4   Dose 65m   Time 6 hour wear time   Manual Therapy   Manual Therapy Soft tissue mobilization   Manual therapy comments gentle STW to Rt elbow and forearm with PROM for elongation   Soft tissue mobilization gentle MFR Rt forearm                  PT Short Term Goals - 01/04/15 1730    PT SHORT TERM GOAL #1   Title be independent in initial HEP   Time 4   Period Weeks   Status On-going   PT SHORT TERM GOAL #2    Title report 30% reduciton in Rt elbow pain with use   Time 4   Period Weeks   Status On-going   PT SHORT TERM GOAL #3   Title demonstrate 30# Rt grip strength to improve use of Rt UE   Time 4   Period Weeks   Status On-going           PT Long Term Goals - 12/21/14 1449    PT LONG TERM GOAL #1   Title be independent in advanced HEP   Time 8   Period Weeks   Status New   PT LONG TERM GOAL #2   Title reduce FOTO to < or = to 37% limitation   Time 8   Period Weeks   Status New   PT LONG TERM GOAL #3   Title report a 60% reduction in Rt elbow pain with use   Time 8   Period Weeks   Status New   PT LONG TERM GOAL #4   Title demonstrate 4 to 4+/5 Rt elbow and wrist strength to improve endurance with use   Time 8   Period Weeks   Status New   PT LONG TERM GOAL #5   Title demonstrate 40# Rt grip strength to imrpove use of Rt hand   Time 8   Period Weeks   Status New               Plan - 01/04/15 1728    Clinical Impression Statement Pt had a very busy weakend and feels to fatique for exercises, tenderness on Rt foreamr is less than last week. Pt will conitnue to benefit from skilled PT and Ionto   Pt will benefit from skilled therapeutic intervention in order to improve on the following deficits Decreased strength;Decreased activity tolerance;Pain;Impaired UE functional use   Rehab Potential Good   PT Frequency 2x / week   PT Duration 8 weeks   PT Treatment/Interventions ADLs/Self Care Home Management;Cryotherapy;Electrical Stimulation;Iontophoresis 437mml Dexamethasone;Moist Heat;Therapeutic exercise;Therapeutic activities;Neuromuscular re-education;Passive range of motion;Dry needling   PT Next Visit Plan Ionto #5, US[place]oreacrossfriction as tolerated, gentle strength and flexibility   Consulted and Agree with Plan of Care Patient        Problem List Patient Active Problem List   Diagnosis Date Noted  . MGUS (monoclonal gammopathy of unknown significance)  12/23/2013  . Chronic cholecystitis without calculus 10/18/2012  . Abdominal pain, unspecified site 10/18/2012  . Nausea alone 10/18/2012  . Unspecified constipation  10/18/2012  . Depression 10/18/2012  . Bipolar 2 disorder ([facility]) 10/18/2012  . Anxiety   . ASCUS (atypical squamous cells of undetermined significance) on Pap smear   . IUD   . Hemorrhoids 11/29/2010  . Abdominal pain, left upper quadrant 11/29/2010    NAUMANN-HOUEGNIFIO,ELKE  PTA  01/04/2015, 5:31 PM  [facility] [address] [place], [place], [address] Phone: [phone]   Fax:  [phone]  Name: Annamary M Duplessis MRN: [id number] Date of Birth: 05/13/1960     

## 2015-01-07 ENCOUNTER — Encounter: Payer: Self-pay | Admitting: Physical Therapy

## 2015-01-07 ENCOUNTER — Ambulatory Visit: Payer: Medicare Other | Admitting: Physical Therapy

## 2015-01-07 DIAGNOSIS — J3089 Other allergic rhinitis: Secondary | ICD-10-CM | POA: Diagnosis not present

## 2015-01-07 DIAGNOSIS — R29898 Other symptoms and signs involving the musculoskeletal system: Secondary | ICD-10-CM

## 2015-01-07 DIAGNOSIS — J301 Allergic rhinitis due to pollen: Secondary | ICD-10-CM | POA: Diagnosis not present

## 2015-01-07 DIAGNOSIS — J3081 Allergic rhinitis due to animal (cat) (dog) hair and dander: Secondary | ICD-10-CM | POA: Diagnosis not present

## 2015-01-07 DIAGNOSIS — M25521 Pain in right elbow: Secondary | ICD-10-CM

## 2015-01-07 NOTE — Therapy (Signed)
[facility] [address], [place], [place], [address] Phone: [phone]   Fax:  [phone]  Physical Therapy Treatment  Patient Details  Name: Danielle Bruce MRN: [id number] Date of Birth: 17-Apr-1960 Referring Provider: Mayra Neer, MD  Encounter Date: 01/07/2015      PT End of Session - 01/07/15 1717    Visit Number 5   Number of Visits 10   Date for PT Re-Evaluation 02/15/15   PT Start Time 7001   PT Stop Time 1533   PT Time Calculation (min) 47 min   Activity Tolerance Patient tolerated treatment well   Behavior During Therapy [facility] for tasks assessed/performed      Past Medical History  Diagnosis Date  . GERD (gastroesophageal reflux disease)   . 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 Migraines   . Bipolar 2 disorder ([place])   . IBS (irritable bowel syndrome)   . Constipation   . ASCUS (atypical squamous cells of undetermined significance) on Pap smear 05/06/05    NEG HIGH RISK HPV--C&B BIOPSY BENIGN 12/2005  . Anxiety   . 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    cytology negative  . Fibromyalgia   . 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    Bone marrow biopsy showes 8% plasma cells IgA Lambda  . Fibromyalgia 10/2013    Past Surgical History  Procedure Laterality Date  . Hemorrhoid surgery  1993    x3  . Cesarean section  [id number]  . Shoulder surgery  2007/2008  . Spine surgery  2010    cervical  . Dilation and curettage of uterus    . Pelvic laparoscopy    . Intrauterine device insertion  01/04/2010    MIRENA  . Laparoscopy N/A 10/16/2012    Procedure: LAPAROSCOPY DIAGNOSTIC;  Surgeon: Joyice Faster. Cornett, MD;  [facility]: [facility];  Service: General;  Laterality: N/A;  . Cholecystectomy N/A 10/16/2012    Procedure: LAPAROSCOPIC CHOLECYSTECTOMY;  Surgeon: Joyice Faster. Cornett, MD;  [facility];  Service: General;  Laterality: N/A;  . Laparoscopic lysis of adhesions N/A 10/16/2012   Procedure: LAPAROSCOPIC LYSIS OF ADHESIONS;  Surgeon: Joyice Faster. Cornett, MD;  [facility];  Service: General;  Laterality: N/A;  . Bone marrow biopsy Left 12/18/2013    Plasma cell dyscrasia 8% population of plasma cells    There were no vitals filed for this visit.  Visit Diagnosis:  Elbow pain, right  Weakness of wrist      Subjective Assessment - 01/07/15 1452    Subjective Pain in right elbow with use rated as 1/10, but using it for lifting a grocery bag or other utensils right elbow hurst.    Currently in Pain? Yes   Pain Score 1    Pain Location Elbow   Pain Orientation Right   Pain Descriptors / Indicators Aching;Sharp   Pain Onset More than a month ago   Pain Frequency Intermittent   Multiple Pain Sites No                         [facility] Adult PT Treatment/Exercise - 01/07/15 0001    Posture/Postural Control   Posture/Postural Control No significant limitations   Exercises   Exercises Elbow;Shoulder   Elbow Exercises   Elbow Flexion AROM;20 reps;10 reps;Seated;Bar weights/barbell   Bar Weights/Barbell (Elbow Flexion) 2 lbs   Forearm Supination Strengthening;Both;20 reps;10 reps;Bar weights/barbell  2# in each hand   Forearm Pronation  Strengthening;20 reps;10 reps;Bar weights/barbell  2#   Shoulder Exercises: Pulleys   Flexion 3 minutes   Wrist Exercises   Bar Weights/Barbell (Forearm Supination) 1 lb  Wrist extension and extension 3x10 each   Bar Weights/Barbell (Forearm Pronation) --   Modalities   Modalities Ultrasound   Ultrasound   Ultrasound Location Rt lat. elbow   Ultrasound Parameters 50%, 1Mhz, 1W/cm,    Ultrasound Goals Pain   Iontophoresis   Type of Iontophoresis Dexamethasone   Location #5 Rt elbow   Dose 63m   Time 6 hour wear time   Manual Therapy   Manual Therapy Soft tissue mobilization   Manual therapy comments gentle STW to Rt elbow and forearm with PROM for elongation   Soft tissue mobilization gentle MFR Rt  forearm                  PT Short Term Goals - 01/04/15 1730    PT SHORT TERM GOAL #1   Title be independent in initial HEP   Time 4   Period Weeks   Status On-going   PT SHORT TERM GOAL #2   Title report 30% reduciton in Rt elbow pain with use   Time 4   Period Weeks   Status On-going   PT SHORT TERM GOAL #3   Title demonstrate 30# Rt grip strength to improve use of Rt UE   Time 4   Period Weeks   Status On-going           PT Long Term Goals - 12/21/14 1449    PT LONG TERM GOAL #1   Title be independent in advanced HEP   Time 8   Period Weeks   Status New   PT LONG TERM GOAL #2   Title reduce FOTO to < or = to 37% limitation   Time 8   Period Weeks   Status New   PT LONG TERM GOAL #3   Title report a 60% reduction in Rt elbow pain with use   Time 8   Period Weeks   Status New   PT LONG TERM GOAL #4   Title demonstrate 4 to 4+/5 Rt elbow and wrist strength to improve endurance with use   Time 8   Period Weeks   Status New   PT LONG TERM GOAL #5   Title demonstrate 40# Rt grip strength to imrpove use of Rt hand   Time 8   Period Weeks   Status New               Plan - 01/07/15 1717    Clinical Impression Statement Pt feels much better and tolerates exercises well today. Pt will continue to benefit from skilled PT    Pt will benefit from skilled therapeutic intervention in order to improve on the following deficits Decreased strength;Decreased activity tolerance;Pain;Impaired UE functional use   Rehab Potential Good   PT Frequency 2x / week   PT Duration 8 weeks   PT Treatment/Interventions ADLs/Self Care Home Management;Cryotherapy;Electrical Stimulation;Iontophoresis 427mml Dexamethasone;Moist Heat;Therapeutic exercise;Therapeutic activities;Neuromuscular re-education;Passive range of motion;Dry needling   PT Next Visit Plan Ionto # 6, strengthening, USKoreasofttissue work   CoOncologistith Plan of Care Patient        Problem  List Patient Active Problem List   Diagnosis Date Noted  . MGUS (monoclonal gammopathy of unknown significance) 12/23/2013  . Chronic cholecystitis without calculus 10/18/2012  . Abdominal pain, unspecified site 10/18/2012  . Nausea alone  10/18/2012  . Unspecified constipation 10/18/2012  . Depression 10/18/2012  . Bipolar 2 disorder ([place]) 10/18/2012  . Anxiety   . ASCUS (atypical squamous cells of undetermined significance) on Pap smear   . IUD   . Hemorrhoids 11/29/2010  . Abdominal pain, left upper quadrant 11/29/2010    NAUMANN-HOUEGNIFIO,Noriah Osgood PTA 01/07/2015, 5:21 PM  [facility] [address] [place], [place], [address] Phone: [phone]   Fax:  [phone]  Name: TOREY REGAN MRN: [id number] Date of Birth: [birth year]

## 2015-01-11 ENCOUNTER — Ambulatory Visit: Payer: Medicare Other | Admitting: Physical Therapy

## 2015-01-11 ENCOUNTER — Encounter: Payer: Self-pay | Admitting: Physical Therapy

## 2015-01-11 DIAGNOSIS — R29898 Other symptoms and signs involving the musculoskeletal system: Secondary | ICD-10-CM | POA: Diagnosis not present

## 2015-01-11 DIAGNOSIS — M25521 Pain in right elbow: Secondary | ICD-10-CM | POA: Diagnosis not present

## 2015-01-11 NOTE — Therapy (Signed)
[facility] [address], [place], [place], [address] Phone: [phone]   Fax:  [phone]  Physical Therapy Treatment  Patient Details  Name: Danielle Bruce MRN: [id number] Date of Birth: [birth year] Referring Provider: Mayra Neer, MD  Encounter Date: 01/11/2015      PT End of Session - 01/11/15 1531    Visit Number 6   Number of Visits 10   Date for PT Re-Evaluation 02/15/15   PT Start Time 3235   PT Stop Time 1531   PT Time Calculation (min) 48 min   Activity Tolerance Patient tolerated treatment well   Behavior During Therapy [facility] for tasks assessed/performed      Past Medical History  Diagnosis Date  . GERD (gastroesophageal reflux disease)   . 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 Migraines   . Bipolar 2 disorder ([place])   . IBS (irritable bowel syndrome)   . Constipation   . ASCUS (atypical squamous cells of undetermined significance) on Pap smear 05/06/05    NEG HIGH RISK HPV--C&B BIOPSY BENIGN 12/2005  . Anxiety   . 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    cytology negative  . Fibromyalgia   . 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    Bone marrow biopsy showes 8% plasma cells IgA Lambda  . Fibromyalgia 10/2013    Past Surgical History  Procedure Laterality Date  . Hemorrhoid surgery  1993    x3  . Cesarean section  [id number]  . Shoulder surgery  2007/2008  . Spine surgery  2010    cervical  . Dilation and curettage of uterus    . Pelvic laparoscopy    . Intrauterine device insertion  01/04/2010    MIRENA  . Laparoscopy N/A 10/16/2012    Procedure: LAPAROSCOPY DIAGNOSTIC;  Surgeon: Joyice Faster. Cornett, MD;  [facility];  Service: General;  Laterality: N/A;  . Cholecystectomy N/A 10/16/2012    Procedure: LAPAROSCOPIC CHOLECYSTECTOMY;  Surgeon: Joyice Faster. Cornett, MD;  [facility]: [facility];  Service: General;  Laterality: N/A;  . Laparoscopic lysis of adhesions N/A 10/16/2012   Procedure: LAPAROSCOPIC LYSIS OF ADHESIONS;  Surgeon: Joyice Faster. Cornett, MD;  [facility];  Service: General;  Laterality: N/A;  . Bone marrow biopsy Left 12/18/2013    Plasma cell dyscrasia 8% population of plasma cells    There were no vitals filed for this visit.  Visit Diagnosis:  Elbow pain, right  Weakness of wrist      Subjective Assessment - 01/11/15 1454    Subjective Pt reports whiule making bed this am, she felt some discomfort in right forearm, but denies pain.     Currently in Pain? No/denies                         [facility] Adult PT Treatment/Exercise - 01/11/15 0001    Posture/Postural Control   Posture/Postural Control No significant limitations   Exercises   Exercises Elbow;Shoulder   Elbow Exercises   Elbow Flexion AROM;20 reps;10 reps;Seated;Bar weights/barbell   Bar Weights/Barbell (Elbow Flexion) 2 lbs   Forearm Supination Strengthening;Both;20 reps;10 reps;Bar weights/barbell  2# in each hand   Forearm Pronation Strengthening;20 reps;10 reps;Bar weights/barbell  2#   Shoulder Exercises: Pulleys   Flexion 3 minutes   Shoulder Exercises: Power [employer] 20 reps;10 reps  in standing 20#    Other Power Tower Exercises Chestpress horizontal/vertical grip 15# 3 x 10  Modalities   Modalities Ultrasound   Ultrasound   Ultrasound Location Rt lat elbow   Ultrasound Parameters 50%, 1Mhz, 0,8W/cm   Ultrasound Goals Pain   Iontophoresis   Type of Iontophoresis Dexamethasone   Location #6 Rt elbow   Dose 78m   Time 6 hour wear time   Manual Therapy   Manual Therapy Soft tissue mobilization   Manual therapy comments STW to Rt elbow and forearm with PROM for elongation                  PT Short Term Goals - 01/11/15 1727    PT SHORT TERM GOAL #1   Title be independent in initial HEP   Time 4   Period Weeks   Status On-going   PT SHORT TERM GOAL #2   Title report 30% reduciton in Rt elbow pain with use   Time 4    Period Weeks   Status Achieved   PT SHORT TERM GOAL #3   Title demonstrate 30# Rt grip strength to improve use of Rt UE   Time 4   Period Weeks   Status On-going           PT Long Term Goals - 01/11/15 1728    PT LONG TERM GOAL #1   Title be independent in advanced HEP   Time 8   Period Weeks   Status On-going   PT LONG TERM GOAL #2   Title reduce FOTO to < or = to 37% limitation   Time 8   Period Weeks   Status On-going   PT LONG TERM GOAL #3   Title report a 60% reduction in Rt elbow pain with use   Time 8   Period Weeks   Status On-going   PT LONG TERM GOAL #4   Title demonstrate 4 to 4+/5 Rt elbow and wrist strength to improve endurance with use   Time 8   Period Weeks   Status On-going   PT LONG TERM GOAL #5   Title demonstrate 40# Rt grip strength to imrpove use of Rt hand   Time 8   Period Weeks   Status On-going               Plan - 01/11/15 1531    Clinical Impression Statement Pt with improved tolerance for strengthening exercises and less palpaple tnederness in Rt forearm   Pt will benefit from skilled therapeutic intervention in order to improve on the following deficits Decreased strength;Decreased activity tolerance;Pain;Impaired UE functional use   Rehab Potential Good   PT Frequency 2x / week   PT Duration 8 weeks   PT Treatment/Interventions ADLs/Self Care Home Management;Cryotherapy;Electrical Stimulation;Iontophoresis 434mml Dexamethasone;Moist Heat;Therapeutic exercise;Therapeutic activities;Neuromuscular re-education;Passive range of motion;Dry needling   PT Next Visit Plan strengthenig, USKoreasofttissue work, test grip strength   Consulted and Agree with Plan of Care Patient        Problem List Patient Active Problem List   Diagnosis Date Noted  . MGUS (monoclonal gammopathy of unknown significance) 12/23/2013  . Chronic cholecystitis without calculus 10/18/2012  . Abdominal pain, unspecified site 10/18/2012  . Nausea alone  10/18/2012  . Unspecified constipation 10/18/2012  . Depression 10/18/2012  . B[place] 2 disorder ([place]/22/2014  . Anxiety   . ASCUS (atypical squamous cells of undetermined significance) on Pap smear   . IUD   . Hemorrhoids 11/29/2010  . Abdominal pain, left upper quadrant 11/29/2010    NAUMANN-HOUEGNIFIO,Jantz Main PTA 01/11/2015, 5:30 PM  [facility] [address] [place], [place], [address] Phone: [phone]   Fax:  [phone]  Name: Danielle Bruce MRN: [id number] Date of Birth: May 27, 1960

## 2015-01-12 DIAGNOSIS — J3089 Other allergic rhinitis: Secondary | ICD-10-CM | POA: Diagnosis not present

## 2015-01-12 DIAGNOSIS — J301 Allergic rhinitis due to pollen: Secondary | ICD-10-CM | POA: Diagnosis not present

## 2015-01-12 DIAGNOSIS — J4530 Mild persistent asthma, uncomplicated: Secondary | ICD-10-CM | POA: Diagnosis not present

## 2015-01-12 DIAGNOSIS — H1045 Other chronic allergic conjunctivitis: Secondary | ICD-10-CM | POA: Diagnosis not present

## 2015-01-13 DIAGNOSIS — H5310 Unspecified subjective visual disturbances: Secondary | ICD-10-CM | POA: Diagnosis not present

## 2015-01-13 DIAGNOSIS — H524 Presbyopia: Secondary | ICD-10-CM | POA: Diagnosis not present

## 2015-01-14 ENCOUNTER — Encounter: Payer: Self-pay | Admitting: Physical Therapy

## 2015-01-14 ENCOUNTER — Ambulatory Visit: Payer: Medicare Other | Admitting: Physical Therapy

## 2015-01-14 DIAGNOSIS — J3081 Allergic rhinitis due to animal (cat) (dog) hair and dander: Secondary | ICD-10-CM | POA: Diagnosis not present

## 2015-01-14 DIAGNOSIS — R29898 Other symptoms and signs involving the musculoskeletal system: Secondary | ICD-10-CM

## 2015-01-14 DIAGNOSIS — J301 Allergic rhinitis due to pollen: Secondary | ICD-10-CM | POA: Diagnosis not present

## 2015-01-14 DIAGNOSIS — M25521 Pain in right elbow: Secondary | ICD-10-CM

## 2015-01-14 DIAGNOSIS — J3089 Other allergic rhinitis: Secondary | ICD-10-CM | POA: Diagnosis not present

## 2015-01-14 NOTE — Therapy (Signed)
[facility] [address] [place], [place], [address] Phone: [phone]   Fax:  [phone]  Physical Therapy Treatment  Patient Details  Name: Danielle Bruce MRN: [id number] Date of Birth: 04-07-1960 Referring Provider: Mayra Neer, MD  Encounter Date: 01/14/2015      PT End of Session - 01/14/15 1506    Visit Number 7   Number of Visits 10   Date for PT Re-Evaluation 02/15/15   PT Start Time 6962   PT Stop Time 1532   PT Time Calculation (min) 41 min   Activity Tolerance Patient tolerated treatment well   Behavior During Therapy [facility] for tasks assessed/performed      Past Medical History  Diagnosis Date  . GERD (gastroesophageal reflux disease)   . 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 Migraines   . Bipolar 2 disorder ([place])   . IBS (irritable bowel syndrome)   . Constipation   . ASCUS (atypical squamous cells of undetermined significance) on Pap smear 05/06/05    NEG HIGH RISK HPV--C&B BIOPSY BENIGN 12/2005  . Anxiety   . 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    cytology negative  . Fibromyalgia   . 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    Bone marrow biopsy showes 8% plasma cells IgA Lambda  . Fibromyalgia 10/2013    Past Surgical History  Procedure Laterality Date  . Hemorrhoid surgery  1993    x3  . Cesarean section  [id number]  . Shoulder surgery  2007/2008  . Spine surgery  2010    cervical  . Dilation and curettage of uterus    . Pelvic laparoscopy    . Intrauterine device insertion  01/04/2010    MIRENA  . Laparoscopy N/A 10/16/2012    Procedure: LAPAROSCOPY DIAGNOSTIC;  Surgeon: Joyice Faster. Cornett, MD;  Location: [facility];  Service: General;  Laterality: N/A;  . Cholecystectomy N/A 10/16/2012    Procedure: LAPAROSCOPIC CHOLECYSTECTOMY;  Surgeon: Joyice Faster. Cornett, MD;  Location: [facility];  Service: General;  Laterality: N/A;  . Laparoscopic lysis of adhesions N/A 10/16/2012     Procedure: LAPAROSCOPIC LYSIS OF ADHESIONS;  Surgeon: Joyice Faster. Cornett, MD;  [facility]: [facility];  Service: General;  Laterality: N/A;  . Bone marrow biopsy Left 12/18/2013    Plasma cell dyscrasia 8% population of plasma cells    There were no vitals filed for this visit.  Visit Diagnosis:  Elbow pain, right  Weakness of wrist      Subjective Assessment - 01/14/15 1454    Subjective Pt reports yesterday the right elbow was hurting may be due to folding laundry and driving   Currently in Pain? Yes   Pain Score 2    Pain Location Elbow   Pain Orientation Right   Pain Descriptors / Indicators Aching;Sharp   Pain Type Chronic pain   Pain Onset More than a month ago   Pain Frequency Intermittent   Multiple Pain Sites No            [facility] PT Assessment - 01/14/15 0001    Strength   Right Hand Grip (lbs) 45   Left Hand Grip (lbs) 41                     [facility] Adult PT Treatment/Exercise - 01/14/15 0001    Posture/Postural Control   Posture/Postural Control No significant limitations   Exercises   Exercises Elbow;Shoulder;Wrist   Elbow Exercises  Elbow Flexion AROM;20 reps;10 reps;Seated;Bar weights/barbell   Bar Weights/Barbell (Elbow Flexion) 2 lbs   Forearm Supination Strengthening;Both;20 reps;10 reps;Bar weights/barbell  2#   Forearm Pronation Strengthening;20 reps;10 reps;Bar weights/barbell  2#   Shoulder Exercises: Pulleys   Flexion 3 minutes   Shoulder Exercises: Power Scientist, research (medical) horizontal/vertical grip 15# 3 x 10   Wrist Exercises   Other wrist exercises wrist flexion and extension with 1# weight 2 x 10 in sitting   Modalities   Modalities Ultrasound   Ultrasound   Ultrasound Location Rt lat elbow   Ultrasound Parameters 50%, 1MHz, 0.8Wcm   Ultrasound Goals Pain   Manual Therapy   Manual Therapy Soft tissue mobilization   Manual therapy comments STW to Rt elbow and forearm with PROM for elongation                   PT Short Term Goals - 01/11/15 1727    PT SHORT TERM GOAL #1   Title be independent in initial HEP   Time 4   Period Weeks   Status On-going   PT SHORT TERM GOAL #2   Title report 30% reduciton in Rt elbow pain with use   Time 4   Period Weeks   Status Achieved   PT SHORT TERM GOAL #3   Title demonstrate 30# Rt grip strength to improve use of Rt UE   Time 4   Period Weeks   Status On-going           PT Long Term Goals - 01/14/15 1538    PT LONG TERM GOAL #1   Title be independent in advanced HEP   Time 8   Period Weeks   Status On-going   PT LONG TERM GOAL #2   Title reduce FOTO to < or = to 37% limitation   Time 8   Period Weeks   Status On-going   PT LONG TERM GOAL #3   Title report a 60% reduction in Rt elbow pain with use   Time 8   Period Weeks   Status On-going   PT LONG TERM GOAL #4   Title demonstrate 4 to 4+/5 Rt elbow and wrist strength to improve endurance with use   Time 8   Period Weeks   Status On-going   PT LONG TERM GOAL #5   Title demonstrate 40# Rt grip strength to imrpove use of Rt hand  45# x 2   Time 8   Period Weeks   Status Achieved               Plan - 01/14/15 1507    Clinical Impression Statement Pt with improved activity level, with strengthening exercises. Pt with slighly palpaple tenderness in Rt forearm   Pt will benefit from skilled therapeutic intervention in order to improve on the following deficits Decreased strength;Decreased activity tolerance;Pain;Impaired UE functional use   Rehab Potential Good   PT Frequency 2x / week   PT Duration 8 weeks   PT Treatment/Interventions ADLs/Self Care Home Management;Cryotherapy;Electrical Stimulation;Iontophoresis 1m/ml Dexamethasone;Moist Heat;Therapeutic exercise;Therapeutic activities;Neuromuscular re-education;Passive range of motion;Dry needling   PT Next Visit Plan strengthenig, [place] softtissue work   CNewell Rubbermaidand Agree with Plan of Care  Patient        Problem List Patient Active Problem List   Diagnosis Date Noted  . MGUS (monoclonal gammopathy of unknown significance) 12/23/2013  . Chronic cholecystitis without calculus 10/18/2012  . Abdominal pain, unspecified site 10/18/2012  .  Nausea alone 10/18/2012  . Unspecified constipation 10/18/2012  . Depression 10/18/2012  . Bipolar 2 disorder ([place]) 10/18/2012  . Anxiety   . ASCUS (atypical squamous cells of undetermined significance) on Pap smear   . IUD   . Hemorrhoids 11/29/2010  . Abdominal pain, left upper quadrant 11/29/2010    NAUMANN-HOUEGNIFIO,Terria Deschepper PTA 01/14/2015, 3:50 PM  [facility] [address], [place], [place], [address] Phone: [phone]   Fax:  [phone]  Name: GLADYCE MCRAY MRN: [id number] Date of Birth: [birth year]

## 2015-01-18 ENCOUNTER — Encounter: Payer: Self-pay | Admitting: Physical Therapy

## 2015-01-18 ENCOUNTER — Ambulatory Visit: Payer: Medicare Other | Admitting: Physical Therapy

## 2015-01-18 DIAGNOSIS — M25521 Pain in right elbow: Secondary | ICD-10-CM | POA: Diagnosis not present

## 2015-01-18 DIAGNOSIS — J301 Allergic rhinitis due to pollen: Secondary | ICD-10-CM | POA: Diagnosis not present

## 2015-01-18 DIAGNOSIS — R29898 Other symptoms and signs involving the musculoskeletal system: Secondary | ICD-10-CM | POA: Diagnosis not present

## 2015-01-18 DIAGNOSIS — J3089 Other allergic rhinitis: Secondary | ICD-10-CM | POA: Diagnosis not present

## 2015-01-18 DIAGNOSIS — J3081 Allergic rhinitis due to animal (cat) (dog) hair and dander: Secondary | ICD-10-CM | POA: Diagnosis not present

## 2015-01-18 NOTE — Therapy (Signed)
[facility] [address], [place], [place], [address] Phone: [phone]   Fax:  [phone]  Physical Therapy Treatment  Patient Details  Name: Danielle Bruce MRN: [id number] Date of Birth: 03-30-1960 Referring Provider: Mayra Neer, MD  Encounter Date: 01/18/2015      PT End of Session - 01/18/15 1512    Visit Number 8   Number of Visits 10   Date for PT Re-Evaluation 02/15/15   PT Start Time 8242   PT Stop Time 1530   PT Time Calculation (min) 37 min   Activity Tolerance Patient tolerated treatment well   Behavior During Therapy [facility] for tasks assessed/performed      Past Medical History  Diagnosis Date  . GERD (gastroesophageal reflux disease)   . 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 Migraines   . Bipolar 2 disorder ([place])   . IBS (irritable bowel syndrome)   . Constipation   . ASCUS (atypical squamous cells of undetermined significance) on Pap smear 05/06/05    NEG HIGH RISK HPV--C&B BIOPSY BENIGN 12/2005  . Anxiety   . 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    cytology negative  . Fibromyalgia   . 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    Bone marrow biopsy showes 8% plasma cells IgA Lambda  . Fibromyalgia 10/2013    Past Surgical History  Procedure Laterality Date  . Hemorrhoid surgery  1993    x3  . Cesarean section  [id number]  . Shoulder surgery  2007/2008  . Spine surgery  2010    cervical  . Dilation and curettage of uterus    . Pelvic laparoscopy    . Intrauterine device insertion  01/04/2010    MIRENA  . Laparoscopy N/A 10/16/2012    Procedure: LAPAROSCOPY DIAGNOSTIC;  Surgeon: Joyice Faster. Cornett, MD;  [facility];  Service: General;  Laterality: N/A;  . Cholecystectomy N/A 10/16/2012    Procedure: LAPAROSCOPIC CHOLECYSTECTOMY;  Surgeon: Joyice Faster. Cornett, MD;  [facility];  Service: General;  Laterality: N/A;  . Laparoscopic lysis of adhesions N/A 10/16/2012   Procedure: LAPAROSCOPIC LYSIS OF ADHESIONS;  Surgeon: Joyice Faster. Cornett, MD;  [facility];  Service: General;  Laterality: N/A;  . Bone marrow biopsy Left 12/18/2013    Plasma cell dyscrasia 8% population of plasma cells    There were no vitals filed for this visit.  Visit Diagnosis:  Elbow pain, right  Weakness of wrist      Subjective Assessment - 01/18/15 1510    Subjective My elbow was really hurting over the weekend, today it is still bothering her more than usual.    Currently in Pain? Yes   Pain Score 4    Pain Location Elbow   Pain Orientation Right   Pain Descriptors / Indicators Sore;Shooting   Aggravating Factors  Overuse of RT arm   Pain Relieving Factors Rest   Multiple Pain Sites No                         [facility] Adult PT Treatment/Exercise - 01/18/15 0001    Wrist Exercises   Other wrist exercises Forearm rolling with orange spikey ball    Other wrist exercises wrist flex/ext stretching 3x 10 sec each   Ultrasound   Ultrasound Location RT elbow   Ultrasound Parameters 50% 1 MZ .8wtcm2   Ultrasound Goals Pain   Manual Therapy   Manual therapy comments STW  to Rt elbow and forearm with PROM for elongation                  PT Short Term Goals - 01/18/15 1512    PT SHORT TERM GOAL #1   Title be independent in initial HEP   Time 4   Period Weeks   Status On-going  Just not really compliant           PT Long Term Goals - 01/14/15 1538    PT LONG TERM GOAL #1   Title be independent in advanced HEP   Time 8   Period Weeks   Status On-going   PT LONG TERM GOAL #2   Title reduce FOTO to < or = to 37% limitation   Time 8   Period Weeks   Status On-going   PT LONG TERM GOAL #3   Title report a 60% reduction in Rt elbow pain with use   Time 8   Period Weeks   Status On-going   PT LONG TERM GOAL #4   Title demonstrate 4 to 4+/5 Rt elbow and wrist strength to improve endurance with use   Time 8   Period Weeks    Status On-going   PT LONG TERM GOAL #5   Title demonstrate 40# Rt grip strength to imrpove use of Rt hand  45# x 2   Time 8   Period Weeks   Status Achieved               Plan - 01/18/15 1532    Clinical Impression Statement Having muscle pain today into her forearm pain today. extensors very thick and dense, felt better with deep tissue work. She reports she has done a lot of working on the computer today and that may have contributed. Admitts to not doing her HEP consistentlly.    Pt will benefit from skilled therapeutic intervention in order to improve on the following deficits Decreased strength;Decreased activity tolerance;Pain;Impaired UE functional use   Rehab Potential Good   PT Frequency 2x / week   PT Duration 8 weeks   PT Treatment/Interventions ADLs/Self Care Home Management;Cryotherapy;Electrical Stimulation;Iontophoresis 30m/ml Dexamethasone;Moist Heat;Therapeutic exercise;Therapeutic activities;Neuromuscular re-education;Passive range of motion;Dry needling   PT Next Visit Plan Resume exercises if pain better, manual work,    COncologistwith Plan of Care Patient        Problem List Patient Active Problem List   Diagnosis Date Noted  . MGUS (monoclonal gammopathy of unknown significance) 12/23/2013  . Chronic cholecystitis without calculus 10/18/2012  . Abdominal pain, unspecified site 10/18/2012  . Nausea alone 10/18/2012  . Unspecified constipation 10/18/2012  . Depression 10/18/2012  . Bipolar 2 disorder ([place] 10/18/2012  . Anxiety   . ASCUS (atypical squamous cells of undetermined significance) on Pap smear   . IUD   . Hemorrhoids 11/29/2010  . Abdominal pain, left upper quadrant 11/29/2010    COCHRAN,JENNIFER, PTA 01/18/2015, 3:38 PM  [facility] [address] [place] [address]Phone: [phone]  Fax:  [phone] Name: CLOISTINE EBERLINMRN: [id number] of  Birth: [birth year]   Pt was slightly late.

## 2015-01-26 DIAGNOSIS — G4700 Insomnia, unspecified: Secondary | ICD-10-CM | POA: Diagnosis not present

## 2015-01-26 DIAGNOSIS — G43909 Migraine, unspecified, not intractable, without status migrainosus: Secondary | ICD-10-CM | POA: Diagnosis not present

## 2015-01-26 DIAGNOSIS — F419 Anxiety disorder, unspecified: Secondary | ICD-10-CM | POA: Diagnosis not present

## 2015-01-26 DIAGNOSIS — F3181 Bipolar II disorder: Secondary | ICD-10-CM | POA: Diagnosis not present

## 2015-01-26 DIAGNOSIS — E7800 Pure hypercholesterolemia, unspecified: Secondary | ICD-10-CM | POA: Diagnosis not present

## 2015-01-26 DIAGNOSIS — G629 Polyneuropathy, unspecified: Secondary | ICD-10-CM | POA: Diagnosis not present

## 2015-01-26 DIAGNOSIS — K219 Gastro-esophageal reflux disease without esophagitis: Secondary | ICD-10-CM | POA: Diagnosis not present

## 2015-01-26 DIAGNOSIS — M797 Fibromyalgia: Secondary | ICD-10-CM | POA: Diagnosis not present

## 2015-01-26 DIAGNOSIS — M7710 Lateral epicondylitis, unspecified elbow: Secondary | ICD-10-CM | POA: Diagnosis not present

## 2015-01-26 DIAGNOSIS — K589 Irritable bowel syndrome without diarrhea: Secondary | ICD-10-CM | POA: Diagnosis not present

## 2015-01-26 DIAGNOSIS — K5289 Other specified noninfective gastroenteritis and colitis: Secondary | ICD-10-CM | POA: Diagnosis not present

## 2015-01-28 ENCOUNTER — Ambulatory Visit: Payer: Medicare Other | Attending: Family Medicine

## 2015-01-28 DIAGNOSIS — J301 Allergic rhinitis due to pollen: Secondary | ICD-10-CM | POA: Diagnosis not present

## 2015-01-28 DIAGNOSIS — J3081 Allergic rhinitis due to animal (cat) (dog) hair and dander: Secondary | ICD-10-CM | POA: Diagnosis not present

## 2015-01-28 DIAGNOSIS — R29898 Other symptoms and signs involving the musculoskeletal system: Secondary | ICD-10-CM | POA: Insufficient documentation

## 2015-01-28 DIAGNOSIS — M25521 Pain in right elbow: Secondary | ICD-10-CM | POA: Diagnosis not present

## 2015-01-28 DIAGNOSIS — J3089 Other allergic rhinitis: Secondary | ICD-10-CM | POA: Diagnosis not present

## 2015-01-28 NOTE — Therapy (Signed)
[facility] [address] [place], [place], [address] Phone: [phone]   Fax:  [phone]  Physical Therapy Treatment  Patient Details  Name: Danielle Bruce MRN: [id number] Date of Birth: 14-Aug-1960 Referring Provider: Mayra Neer, MD  Encounter Date: 01/28/2015      PT End of Session - 01/28/15 1602    Visit Number 9   Number of Visits 10   Date for PT Re-Evaluation 02/15/15   PT Start Time 3785   PT Stop Time 1602   PT Time Calculation (min) 29 min   Activity Tolerance Patient tolerated treatment well   Behavior During Therapy [facility] for tasks assessed/performed      Past Medical History  Diagnosis Date  . GERD (gastroesophageal reflux disease)   . 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 Migraines   . Bipolar 2 disorder ([place])   . IBS (irritable bowel syndrome)   . Constipation   . ASCUS (atypical squamous cells of undetermined significance) on Pap smear 05/06/05    NEG HIGH RISK HPV--C&B BIOPSY BENIGN 12/2005  . Anxiety   . 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    cytology negative  . Fibromyalgia   . 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    Bone marrow biopsy showes 8% plasma cells IgA Lambda  . Fibromyalgia 10/2013    Past Surgical History  Procedure Laterality Date  . Hemorrhoid surgery  1993    x3  . Cesarean section  [id number]  . Shoulder surgery  2007/2008  . Spine surgery  2010    cervical  . Dilation and curettage of uterus    . Pelvic laparoscopy    . Intrauterine device insertion  01/04/2010    MIRENA  . Laparoscopy N/A 10/16/2012    Procedure: LAPAROSCOPY DIAGNOSTIC;  Surgeon: Joyice Faster. Cornett, MD;  [facility];  Service: General;  Laterality: N/A;  . Cholecystectomy N/A 10/16/2012    Procedure: LAPAROSCOPIC CHOLECYSTECTOMY;  Surgeon: Joyice Faster. Cornett, MD;  [facility]: [facility];  Service: General;  Laterality: N/A;  . Laparoscopic lysis of adhesions N/A 10/16/2012     Procedure: LAPAROSCOPIC LYSIS OF ADHESIONS;  Surgeon: Joyice Faster. Cornett, MD;  [facility];  Service: General;  Laterality: N/A;  . Bone marrow biopsy Left 12/18/2013    Plasma cell dyscrasia 8% population of plasma cells    There were no vitals filed for this visit.  Visit Diagnosis:  Elbow pain, right  Weakness of wrist      Subjective Assessment - 01/28/15 1537    Subjective Pt reports that Rt arm is feeling better.  Pt reports 80-90% overall improvement since the start of care.     Currently in Pain? Yes   Pain Score 1    Pain Location Elbow   Pain Orientation Right   Pain Descriptors / Indicators Sore                         [facility] Adult PT Treatment/Exercise - 01/28/15 0001    Wrist Exercises   Bar Weights/Barbell (Forearm Supination) 1 lb  Wrist extension and extension 3x10 each   Wrist Radial Deviation Strengthening;Right;20 reps   Wrist Radial Deviation Limitations 1# x20   Modalities   Modalities Ultrasound   Ultrasound   Ultrasound Location Rt elbow and extensor tendons   Ultrasound Parameters 1 MHxz, 1.0w/cm2 50% pulsed   Ultrasound Goals Pain   Manual Therapy   Manual therapy comments  STW to Rt elbow and forearm with PROM for elongation                PT Education - 01/28/15 1558    Education provided Yes   Education Details wrist strength with 2# weight   Person(s) Educated Patient   Methods Explanation;Demonstration;Handout   Comprehension Verbalized understanding;Returned demonstration          PT Short Term Goals - 01/18/15 1512    PT SHORT TERM GOAL #1   Title be independent in initial HEP   Time 4   Period Weeks   Status On-going  Just not really compliant           PT Long Term Goals - 01/14/15 1538    PT LONG TERM GOAL #1   Title be independent in advanced HEP   Time 8   Period Weeks   Status On-going   PT LONG TERM GOAL #2   Title reduce FOTO to < or = to 37% limitation   Time 8   Period Weeks    Status On-going   PT LONG TERM GOAL #3   Title report a 60% reduction in Rt elbow pain with use   Time 8   Period Weeks   Status On-going   PT LONG TERM GOAL #4   Title demonstrate 4 to 4+/5 Rt elbow and wrist strength to improve endurance with use   Time 8   Period Weeks   Status On-going   PT LONG TERM GOAL #5   Title demonstrate 40# Rt grip strength to imrpove use of Rt hand  45# x 2   Time 8   Period Weeks   Status Achieved               Plan - 01/28/15 1601    Clinical Impression Statement Pt reports 80-90% overall improvement since the start of care.  Pt is able to perform exercise with weights without increased pain.  Pt has been busy and has had to rest a lot due to fibromyalgia.  Pt will benefit from skilled PT for strength progression to return to prior level of function.     Pt will benefit from skilled therapeutic intervention in order to improve on the following deficits Decreased strength;Decreased activity tolerance;Pain;Impaired UE functional use   Rehab Potential Good   PT Frequency 2x / week   PT Duration 8 weeks   PT Treatment/Interventions ADLs/Self Care Home Management;Cryotherapy;Electrical Stimulation;Iontophoresis 24m/ml Dexamethasone;Moist Heat;Therapeutic exercise;Therapeutic activities;Neuromuscular re-education;Passive range of motion;Dry needling   PT Next Visit Plan Progress strength as tolerated, FOTO and G-codes, try arm bike   Consulted and Agree with Plan of Care Patient        Problem List Patient Active Problem List   Diagnosis Date Noted  . MGUS (monoclonal gammopathy of unknown significance) 12/23/2013  . Chronic cholecystitis without calculus 10/18/2012  . Abdominal pain, unspecified site 10/18/2012  . Nausea alone 10/18/2012  . Unspecified constipation 10/18/2012  . Depression 10/18/2012  . [place] 2 [place] ([place] 10/18/2012  . Anxiety   . ASCUS (atypical squamous cells of undetermined significance) on Pap smear   . IUD    . Hemorrhoids 11/29/2010  . Abdominal pain, left upper quadrant 11/29/2010    Reign Bartnick, PT 01/28/2015, 4:03 PM  [facility] [address] [place] [address]Phone: [phone]  Fax:  [phone] Name: CCHIZUKO TRINEMRN: [id number] of Birth: 922-Mar-1962

## 2015-01-28 NOTE — Patient Instructions (Signed)
PERFORM EXERCISES 10-20 TIMES, 1-2 TIMES EACH DAY. USE _2____ lb. WEIGHT  Wrist Flexion: Resisted   With right palm up, ____ pound weight in hand, bend wrist up. Return slowly.   Wrist Extension: Resisted   With right palm down, ____ pound weight in hand, bend wrist up. Return slowly.  Forearm Pronation / Supination: Resisted (Sitting)   With right forearm supported, grasp object and gently rotate palm up, then down, as far as possible without pain.  Wrist Radial Deviation: Resisted   With right thumb up, ____ pound weight in hand, bend wrist up. Return slowly.  [facility] [address] [place], [place] [address] Phone # [phone] Fax [phone]

## 2015-01-29 DIAGNOSIS — F3113 Bipolar disorder, current episode manic without psychotic features, severe: Secondary | ICD-10-CM | POA: Diagnosis not present

## 2015-01-29 DIAGNOSIS — R51 Headache: Secondary | ICD-10-CM | POA: Diagnosis not present

## 2015-02-01 ENCOUNTER — Ambulatory Visit: Payer: Medicare Other

## 2015-02-01 DIAGNOSIS — R29898 Other symptoms and signs involving the musculoskeletal system: Secondary | ICD-10-CM | POA: Diagnosis not present

## 2015-02-01 DIAGNOSIS — M25521 Pain in right elbow: Secondary | ICD-10-CM

## 2015-02-01 NOTE — Therapy (Addendum)
[facility] [address] [place], [place], [address] Phone: [phone]   Fax:  [phone]  Physical Therapy Treatment  Patient Details  Name: Danielle Bruce MRN: [id number] Date of Birth: 1960-09-12 Referring Provider: Mayra Neer, MD  Encounter Date: 02/01/2015      PT End of Session - 02/01/15 1521    Visit Number 10   Number of Visits 20   Date for PT Re-Evaluation 02/15/15   PT Start Time 1450   PT Stop Time 1520   PT Time Calculation (min) 30 min   Activity Tolerance Patient tolerated treatment well   Behavior During Therapy [facility] for tasks assessed/performed      Past Medical History  Diagnosis Date  . GERD (gastroesophageal reflux disease)   . 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 Migraines   . Bipolar 2 disorder ([place])   . IBS (irritable bowel syndrome)   . Constipation   . ASCUS (atypical squamous cells of undetermined significance) on Pap smear 05/06/05    NEG HIGH RISK HPV--C&B BIOPSY BENIGN 12/2005  . Anxiety   . 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    cytology negative  . Fibromyalgia   . 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    Bone marrow biopsy showes 8% plasma cells IgA Lambda  . Fibromyalgia 10/2013    Past Surgical History  Procedure Laterality Date  . Hemorrhoid surgery  1993    x3  . Cesarean section  [id number]  . Shoulder surgery  2007/2008  . Spine surgery  2010    cervical  . Dilation and curettage of uterus    . Pelvic laparoscopy    . Intrauterine device insertion  01/04/2010    MIRENA  . Laparoscopy N/A 10/16/2012    Procedure: LAPAROSCOPY DIAGNOSTIC;  Surgeon: Joyice Faster. Cornett, MD;  Location: [facility];  Service: General;  Laterality: N/A;  . Cholecystectomy N/A 10/16/2012    Procedure: LAPAROSCOPIC CHOLECYSTECTOMY;  Surgeon: Joyice Faster. Cornett, MD;  Location: [facility];  Service: General;  Laterality: N/A;  . Laparoscopic lysis of adhesions N/A 10/16/2012     Procedure: LAPAROSCOPIC LYSIS OF ADHESIONS;  Surgeon: Joyice Faster. Cornett, MD;  Location: [facility];  Service: General;  Laterality: N/A;  . Bone marrow biopsy Left 12/18/2013    Plasma cell dyscrasia 8% population of plasma cells    There were no vitals filed for this visit.  Visit Diagnosis:  Elbow pain, right      Subjective Assessment - 02/01/15 1454    Subjective Pt has been sick for a few days.  Hasn't been doing much.  80-90% overall improvement.   Currently in Pain? 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), right   Onset Date/Surgical Date 05/21/14   Hand Dominance Right   Observation/Other Assessments   Focus on Therapeutic Outcomes (FOTO)  40% limited   Posture/Postural Control   Posture/Postural Control No significant limitations   Strength   Overall Strength Deficits   Overall Strength Comments Rt wrist  extension 4+/5, flexion 5/5, radial deviation 4+/5, elbow extension 4/5   Palpation   Palpation comment Pt with mild palpable tenderness over Rt lateral epicondyle and proximal wrist extensors       Treatment:  Ultrasound: 1.0 w/cm 50% pulsed 3 MHz to Rt lateral epicondyle and proximal wrist extensors x 8 minutes  Manual: soft tissue work with elongation and trigger point release to Rt wrist extensor tendons                          PT Short Term Goals - 01/18/15 1512    PT SHORT TERM GOAL #1   Title be independent in initial HEP   Time 4   Period Weeks   Status On-going  Just not really compliant           PT Long Term Goals - 02/28/15 1456    PT LONG TERM GOAL #1   Title (p) be independent  in advanced HEP   Time (p) 8   Period (p) Weeks   Status (p) On-going   PT LONG TERM GOAL #2   Title (p) reduce FOTO to < or = to 37% limitation   Time (p) 8   Period (p) Weeks   Status (p) On-going   PT LONG TERM GOAL #3   Title (p) report a 60% reduction in Rt elbow pain with use   Time (p) 8   Period (p) Weeks   Status (p) Achieved   PT LONG TERM GOAL #4   Title (p) demonstrate 4 to 4+/5 Rt elbow and wrist strength to improve endurance with use   Time (p) 8   Period (p) Weeks   Status (p) Achieved   PT LONG TERM GOAL #5   Title (p) demonstrate 40# Rt grip strength to imrpove use of Rt hand   Status (p) Achieved               Plan - 02/28/15 1519    Clinical Impression Statement Pt reports 80-90% overall improvement in Rt elbow symptoms since the start of care.  Pt continues to limit use of Rt UE high level tasks due to MD orders.  Pt has not been able to begin 1# weight with wrist strength exercises as she has been sick.  FOTO score is 40% limitaiton.  Pt will continue to benefit from skilled PT for progression of Rt UE strength and endurance, manual and modalities.     Pt will benefit from skilled therapeutic intervention in order to improve on the following deficits Decreased strength;Decreased activity tolerance;Pain;Impaired UE functional use   Rehab Potential Good   PT Frequency 2x / week   PT Duration 8 weeks   PT Treatment/Interventions ADLs/Self Care Home Management;Cryotherapy;Electrical Stimulation;Iontophoresis 76m/ml Dexamethasone;Moist Heat;Therapeutic exercise;Therapeutic activities;Neuromuscular re-education;Passive range of motion;Dry needling   PT Next Visit Plan Try arm bike, resume 1# weights with wrist strength, manual and [place] needed.     Consulted and Agree with Plan of Care Patient          G-Codes - 1January 01, 20171454    Functional Assessment Tool Used FOTO: 40% limitation   Functional Limitation Other PT primary   Other PT Primary Goal Status  ((F0277 At least 20 percent but less than 40 percent impaired, limited or restricted   Other PT Primary Discharge Status ((A1287 At least 20 percent but less than 40 percent impaired, limited or restricted      Problem List Patient  Active Problem List   Diagnosis Date Noted  . MGUS (monoclonal gammopathy of unknown significance) 12/23/2013  . Chronic cholecystitis without calculus 10/18/2012  . Abdominal pain, unspecified site 10/18/2012  . Nausea alone 10/18/2012  . Unspecified constipation 10/18/2012  . Depression 10/18/2012  . Bipolar 2 disorder ([place]) 10/18/2012  . Anxiety   . ASCUS (atypical squamous cells of undetermined significance) on Pap smear   . IUD   . Hemorrhoids 11/29/2010  . Abdominal pain, left upper quadrant 11/29/2010  Physical Therapy Progress Note  Dates of Reporting Period: 12/21/14 to 02/01/15  Objective Reports of Subjective Statement: 80-90% overall improvement in symptoms since the start of care.    Objective Measurements: see above for FOTO and MMT.  Goal Update: See above for goal assessment.  Plan: Advance strength and endurance of Rt UE and wean from manual/modalities PRN to allow for return to prior level of function.    Reason Skilled Services are Required: Pt with mild Rt UE pain and limited strength/endurance.  Pt has not returned to lifting and carrying with Rt UE due to pain and weakness.  PT will focus on advancement of strength and endurance.   TAKACS,KELLY, PT 02/01/2015, 3:23 PM  [facility] [address] [place], [place], [address] Phone: [phone]   Fax:  [phone]  Name: Danielle Bruce MRN: [id number] Date of Birth: Feb 07, 1961

## 2015-02-04 ENCOUNTER — Ambulatory Visit: Payer: Medicare Other

## 2015-02-04 DIAGNOSIS — R29898 Other symptoms and signs involving the musculoskeletal system: Secondary | ICD-10-CM | POA: Diagnosis not present

## 2015-02-04 DIAGNOSIS — M25521 Pain in right elbow: Secondary | ICD-10-CM | POA: Diagnosis not present

## 2015-02-04 DIAGNOSIS — J301 Allergic rhinitis due to pollen: Secondary | ICD-10-CM | POA: Diagnosis not present

## 2015-02-04 DIAGNOSIS — J3089 Other allergic rhinitis: Secondary | ICD-10-CM | POA: Diagnosis not present

## 2015-02-04 DIAGNOSIS — J3081 Allergic rhinitis due to animal (cat) (dog) hair and dander: Secondary | ICD-10-CM | POA: Diagnosis not present

## 2015-02-04 NOTE — Therapy (Signed)
[facility] [address] [place], [place], [address] Phone: [phone]   Fax:  [phone]  Physical Therapy Treatment  Patient Details  Name: Danielle Bruce MRN: [id number] Date of Birth: 02-Jul-1960 Referring Provider: Mayra Neer, MD  Encounter Date: 02/04/2015      PT End of Session - 02/04/15 1515    Visit Number 11   Number of Visits 20   Date for PT Re-Evaluation 02/15/15   PT Start Time 4801   PT Stop Time 1513   PT Time Calculation (min) 30 min   Activity Tolerance Patient tolerated treatment well   Behavior During Therapy [facility] for tasks assessed/performed      Past Medical History  Diagnosis Date  . GERD (gastroesophageal reflux disease)   . 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 Migraines   . Bipolar 2 disorder ([place])   . IBS (irritable bowel syndrome)   . Constipation   . ASCUS (atypical squamous cells of undetermined significance) on Pap smear 05/06/05    NEG HIGH RISK HPV--C&B BIOPSY BENIGN 12/2005  . Anxiety   . 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    cytology negative  . Fibromyalgia   . 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    Bone marrow biopsy showes 8% plasma cells IgA Lambda  . Fibromyalgia 10/2013    Past Surgical History  Procedure Laterality Date  . Hemorrhoid surgery  1993    x3  . Cesarean section  [id number]  . Shoulder surgery  2007/2008  . Spine surgery  2010    cervical  . Dilation and curettage of uterus    . Pelvic laparoscopy    . Intrauterine device insertion  01/04/2010    MIRENA  . Laparoscopy N/A 10/16/2012    Procedure: LAPAROSCOPY DIAGNOSTIC;  Surgeon: Joyice Faster. Cornett, MD;  Location: [facility];  Service: General;  Laterality: N/A;  . Cholecystectomy N/A 10/16/2012    Procedure: LAPAROSCOPIC CHOLECYSTECTOMY;  Surgeon: Joyice Faster. Cornett, MD;  Location: [facility];  Service: General;  Laterality: N/A;  . Laparoscopic lysis of adhesions N/A 10/16/2012   Procedure: LAPAROSCOPIC LYSIS OF ADHESIONS;  Surgeon: Joyice Faster. Cornett, MD;  Location: [facility];  Service: General;  Laterality: N/A;  . Bone marrow biopsy Left 12/18/2013    Plasma cell dyscrasia 8% population of plasma cells    There were no vitals filed for this visit.  Visit Diagnosis:  Elbow pain, right  Weakness of wrist      Subjective Assessment - 02/04/15 1446    Subjective Pt was able to put out some Christmas decorations and had mild pain in Rt elbow.  Pain resolved quickly.     Currently in Pain? No/denies                         [facility] Adult PT Treatment/Exercise - 02/04/15 0001    Wrist Exercises   Bar Weights/Barbell (Forearm Supination) 1 lb  Wrist extension and extension 3x10 each   Wrist Radial Deviation Strengthening;Right;20 reps   Wrist Radial Deviation Limitations 1# x20   Other wrist exercises wrist flex/ext stretching 3x 10 sec each   Modalities   Modalities Ultrasound   Ultrasound   Ultrasound Location Rt elbow and extensor tendons   Ultrasound Parameters 1 MHz 1.0 w/cm 50% pulsed x 8 minutes   Ultrasound Goals Pain   Manual Therapy   Manual therapy comments STW to Rt elbow and forearm with PROM  for elongation                  PT Short Term Goals - 01/18/15 1512    PT SHORT TERM GOAL #1   Title be independent in initial HEP   Time 4   Period Weeks   Status On-going  Just not really compliant           PT Long Term Goals - 02/01/15 1456    PT LONG TERM GOAL #1   Title (p) be independent in advanced HEP   Time (p) 8   Period (p) Weeks   Status (p) On-going   PT LONG TERM GOAL #2   Title (p) reduce FOTO to < or = to 37% limitation   Time (p) 8   Period (p) Weeks   Status (p) On-going   PT LONG TERM GOAL #3   Title (p) report a 60% reduction in Rt elbow pain with use   Time (p) 8   Period (p) Weeks   Status (p) Achieved   PT LONG TERM GOAL #4   Title (p) demonstrate 4 to 4+/5 Rt elbow and wrist strength to  improve endurance with use   Time (p) 8   Period (p) Weeks   Status (p) Achieved   PT LONG TERM GOAL #5   Title (p) demonstrate 40# Rt grip strength to imrpove use of Rt hand   Status (p) Achieved               Plan - 02/04/15 1512    Clinical Impression Statement Pt able to use Rt UE to put out Christmas decorations and had only mild soreness.  Pt returned to doing weighted exercise with the Rt UE today after taking a~1 week off due to illness and flare-up of fibromyalgia.  Pt will continue to benefit form skilled PT for progression of Rt UE strength and endurance and manual modalties.   Pt will benefit from skilled therapeutic intervention in order to improve on the following deficits Decreased strength;Decreased activity tolerance;Pain;Impaired UE functional use   Rehab Potential Good   PT Frequency 2x / week   PT Duration 8 weeks   PT Treatment/Interventions ADLs/Self Care Home Management;Cryotherapy;Electrical Stimulation;Iontophoresis 66m/ml Dexamethasone;Moist Heat;Therapeutic exercise;Therapeutic activities;Neuromuscular re-education;Passive range of motion;Dry needling   PT Next Visit Plan Try arm bike, resume 1# weights with wrist strength, manual and [place] needed.     Consulted and Agree with Plan of Care Patient        Problem List Patient Active Problem List   Diagnosis Date Noted  . MGUS (monoclonal gammopathy of unknown significance) 12/23/2013  . Chronic cholecystitis without calculus 10/18/2012  . Abdominal pain, unspecified site 10/18/2012  . Nausea alone 10/18/2012  . Unspecified constipation 10/18/2012  . Depression 10/18/2012  . Bipolar 2 disorder ([place] 10/18/2012  . Anxiety   . ASCUS (atypical squamous cells of undetermined significance) on Pap smear   . IUD   . Hemorrhoids 11/29/2010  . Abdominal pain, left upper quadrant 11/29/2010    Shakyia Bosso, PT 02/04/2015, 3:16 PM  [facility] [address] [place] [address]: [phone]  Fax:  [phone] Name: Danielle UPPERMANMRN: [id number] of Birth: 919-Oct-1962

## 2015-02-11 ENCOUNTER — Ambulatory Visit: Payer: Medicare Other

## 2015-02-11 DIAGNOSIS — M25521 Pain in right elbow: Secondary | ICD-10-CM | POA: Diagnosis not present

## 2015-02-11 DIAGNOSIS — R29898 Other symptoms and signs involving the musculoskeletal system: Secondary | ICD-10-CM | POA: Diagnosis not present

## 2015-02-11 DIAGNOSIS — J301 Allergic rhinitis due to pollen: Secondary | ICD-10-CM | POA: Diagnosis not present

## 2015-02-11 DIAGNOSIS — J3081 Allergic rhinitis due to animal (cat) (dog) hair and dander: Secondary | ICD-10-CM | POA: Diagnosis not present

## 2015-02-11 DIAGNOSIS — J3089 Other allergic rhinitis: Secondary | ICD-10-CM | POA: Diagnosis not present

## 2015-02-11 NOTE — Therapy (Signed)
[facility] [address] [place], [place], [address] Phone: [phone]   Fax:  [phone]  Physical Therapy Treatment  Patient Details  Name: Danielle Bruce MRN: [id number] Date of Birth: September 28, 1960 Referring Provider: Mayra Neer, MD  Encounter Date: 02/11/2015      PT End of Session - 02/11/15 1518    Visit Number 12   PT Start Time 5638   PT Stop Time 1521   PT Time Calculation (min) 36 min   Activity Tolerance Patient tolerated treatment well   Behavior During Therapy [facility] for tasks assessed/performed      Past Medical History  Diagnosis Date  . GERD (gastroesophageal reflux disease)   . 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 Migraines   . Bipolar 2 disorder ([place])   . IBS (irritable bowel syndrome)   . Constipation   . ASCUS (atypical squamous cells of undetermined significance) on Pap smear 05/06/05    NEG HIGH RISK HPV--C&B BIOPSY BENIGN 12/2005  . Anxiety   . 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    cytology negative  . Fibromyalgia   . 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    Bone marrow biopsy showes 8% plasma cells IgA Lambda  . Fibromyalgia 10/2013    Past Surgical History  Procedure Laterality Date  . Hemorrhoid surgery  1993    x3  . Cesarean section  [id number]  . Shoulder surgery  2007/2008  . Spine surgery  2010    cervical  . Dilation and curettage of uterus    . Pelvic laparoscopy    . Intrauterine device insertion  01/04/2010    MIRENA  . Laparoscopy N/A 10/16/2012    Procedure: LAPAROSCOPY DIAGNOSTIC;  Surgeon: Joyice Faster. Cornett, MD;  Location: [facility];  Service: General;  Laterality: N/A;  . Cholecystectomy N/A 10/16/2012    Procedure: LAPAROSCOPIC CHOLECYSTECTOMY;  Surgeon: Joyice Faster. Cornett, MD;  Location: [facility];  Service: General;  Laterality: N/A;  . Laparoscopic lysis of adhesions N/A 10/16/2012    Procedure: LAPAROSCOPIC LYSIS OF ADHESIONS;  Surgeon:  Joyice Faster. Cornett, MD;  Location: [facility];  Service: General;  Laterality: N/A;  . Bone marrow biopsy Left 12/18/2013    Plasma cell dyscrasia 8% population of plasma cells    There were no vitals filed for this visit.  Visit Diagnosis:  Elbow pain, right  Weakness of wrist      Subjective Assessment - 02/11/15 1452    Subjective 80-90% overall improvement since the start of care.  Ready for D/C.     Currently in Pain? 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), right   Onset Date/Surgical Date 05/21/14   Hand Dominance Right   Observation/Other Assessments   Focus on Therapeutic Outcomes (FOTO)  40% limited   Posture/Postural Control   Posture/Postural Control No significant limitations   Strength   Overall Strength Deficits   Overall Strength Comments Rt wrist extension 4+/5, flexion 5/5, radial deviation 4+/5, elbow extension 5/5, elbow flexion 5/5- no pain   Right Hand Grip (lbs) 57   Left Hand Grip (lbs) 54   Palpation   Palpation comment Pt with mild palpable tenderness over Rt lateral epicondyle and proximal wrist extensors                     [facility]  Adult PT Treatment/Exercise - Mar 12, 2015 0001    Wrist Exercises   Bar Weights/Barbell (Forearm Supination) 1 lb  Wrist extension and extension 3x10 each   Wrist Radial Deviation Strengthening;Right;20 reps   Wrist Radial Deviation Limitations 1# x 20   Other wrist exercises wrist flex/ext stretching 3x 10 sec each   Modalities   Modalities Ultrasound   Ultrasound   Ultrasound Location Rt elbow extensor tendons   Ultrasound Parameters 1 MHz, 50% pulsed x 8 minutes   Ultrasound Goals Pain   Manual Therapy   Manual therapy comments STW to Rt elbow and forearm with PROM for elongation                  PT Short Term Goals - 01/18/15 1512    PT SHORT TERM GOAL #1   Title be independent in initial HEP   Time 4    Period Weeks   Status On-going  Just not really compliant           PT Long Term Goals - 03-12-15 1453    PT LONG TERM GOAL #1   Title be independent in advanced HEP   Status Achieved   PT LONG TERM GOAL #2   Title reduce FOTO to < or = to 37% limitation   Status On-going   PT LONG TERM GOAL #3   Title report a 60% reduction in Rt elbow pain with use   Status Achieved   PT LONG TERM GOAL #4   Title demonstrate 4 to 4+/5 Rt elbow and wrist strength to improve endurance with use   Status Achieved   PT LONG TERM GOAL #5   Title demonstrate 40# Rt grip strength to imrpove use of Rt hand   Status Achieved               Plan - Mar 12, 2015 1502    Clinical Impression Statement Pt reports 80-90% overall improvement in Lt elbow pain since the start of care.  Pt with normal strength today without pain.  Mild tension and tenderness in Rt wrist extensor tendons.  Pt has HEP in place and has met all goals.     PT Next Visit Plan D/C PT   Consulted and Agree with Plan of Care Patient          G-Codes - 2015/03/12 1501    Functional Assessment Tool Used FOTO: 40% limitation   Functional Limitation Other PT primary   Other PT Primary Goal Status (V5643) At least 20 percent but less than 40 percent impaired, limited or restricted   Other PT Primary Discharge Status (P2951) At least 20 percent but less than 40 percent impaired, limited or restricted      Problem List Patient Active Problem List   Diagnosis Date Noted  . MGUS (monoclonal gammopathy of unknown significance) 12/23/2013  . Chronic cholecystitis without calculus 10/18/2012  . Abdominal pain, unspecified site 10/18/2012  . Nausea alone 10/18/2012  . Unspecified constipation 10/18/2012  . Depression 10/18/2012  . Bipolar 2 disorder ([place]) 10/18/2012  . Anxiety   . ASCUS (atypical squamous cells of undetermined significance) on Pap smear   . IUD   . Hemorrhoids 11/29/2010  . Abdominal pain, left upper quadrant  11/29/2010   PHYSICAL THERAPY DISCHARGE SUMMARY  Visits from Start of Care: 12  Current functional level related to goals / functional outcomes: Pt reports 80-90% reduction in pain in Rt elbow since the start of care.  Pt will D/C to HEP.  Pt  will continue with HEP.   Remaining deficits: Mild Rt elbow pain with endurance tasks.     Education / Equipment: HEP Plan: Patient agrees to discharge.  Patient goals were met. Patient is being discharged due to meeting the stated rehab goals.  ?????      Yedidya Duddy, PT 02/11/2015, 3:20 PM  [facility] [address], [place], [place], [address] Phone: [phone]   Fax:  [phone]  Name: Danielle Bruce MRN: [id number] Date of Birth: 09/16/1960

## 2015-02-25 DIAGNOSIS — J301 Allergic rhinitis due to pollen: Secondary | ICD-10-CM | POA: Diagnosis not present

## 2015-02-25 DIAGNOSIS — J3089 Other allergic rhinitis: Secondary | ICD-10-CM | POA: Diagnosis not present

## 2015-02-25 DIAGNOSIS — J3081 Allergic rhinitis due to animal (cat) (dog) hair and dander: Secondary | ICD-10-CM | POA: Diagnosis not present

## 2015-02-26 DIAGNOSIS — K5289 Other specified noninfective gastroenteritis and colitis: Secondary | ICD-10-CM | POA: Diagnosis not present

## 2015-02-26 DIAGNOSIS — F3181 Bipolar II disorder: Secondary | ICD-10-CM | POA: Diagnosis not present

## 2015-02-26 DIAGNOSIS — K219 Gastro-esophageal reflux disease without esophagitis: Secondary | ICD-10-CM | POA: Diagnosis not present

## 2015-02-26 DIAGNOSIS — G43909 Migraine, unspecified, not intractable, without status migrainosus: Secondary | ICD-10-CM | POA: Diagnosis not present

## 2015-02-26 DIAGNOSIS — G4700 Insomnia, unspecified: Secondary | ICD-10-CM | POA: Diagnosis not present

## 2015-02-26 DIAGNOSIS — G629 Polyneuropathy, unspecified: Secondary | ICD-10-CM | POA: Diagnosis not present

## 2015-02-26 DIAGNOSIS — F4310 Post-traumatic stress disorder, unspecified: Secondary | ICD-10-CM | POA: Diagnosis not present

## 2015-02-26 DIAGNOSIS — F411 Generalized anxiety disorder: Secondary | ICD-10-CM | POA: Diagnosis not present

## 2015-02-26 DIAGNOSIS — E7800 Pure hypercholesterolemia, unspecified: Secondary | ICD-10-CM | POA: Diagnosis not present

## 2015-02-26 DIAGNOSIS — K589 Irritable bowel syndrome without diarrhea: Secondary | ICD-10-CM | POA: Diagnosis not present

## 2015-02-26 DIAGNOSIS — M797 Fibromyalgia: Secondary | ICD-10-CM | POA: Diagnosis not present

## 2015-02-28 HISTORY — PX: IUD REMOVAL: SHX5392

## 2015-03-03 ENCOUNTER — Ambulatory Visit (INDEPENDENT_AMBULATORY_CARE_PROVIDER_SITE_OTHER): Payer: Medicare Other | Admitting: Gynecology

## 2015-03-03 ENCOUNTER — Encounter: Payer: Self-pay | Admitting: Gynecology

## 2015-03-03 VITALS — BP 118/74 | Ht 62.0 in | Wt 121.0 lb

## 2015-03-03 DIAGNOSIS — Z30432 Encounter for removal of intrauterine contraceptive device: Secondary | ICD-10-CM

## 2015-03-03 DIAGNOSIS — J3081 Allergic rhinitis due to animal (cat) (dog) hair and dander: Secondary | ICD-10-CM | POA: Diagnosis not present

## 2015-03-03 DIAGNOSIS — J3089 Other allergic rhinitis: Secondary | ICD-10-CM | POA: Diagnosis not present

## 2015-03-03 DIAGNOSIS — Z01419 Encounter for gynecological examination (general) (routine) without abnormal findings: Secondary | ICD-10-CM | POA: Diagnosis not present

## 2015-03-03 DIAGNOSIS — G8929 Other chronic pain: Secondary | ICD-10-CM | POA: Diagnosis not present

## 2015-03-03 DIAGNOSIS — R1031 Right lower quadrant pain: Secondary | ICD-10-CM | POA: Diagnosis not present

## 2015-03-03 DIAGNOSIS — J301 Allergic rhinitis due to pollen: Secondary | ICD-10-CM | POA: Diagnosis not present

## 2015-03-03 NOTE — Patient Instructions (Addendum)
Follow up for ultrasound as scheduled.  Call to Schedule your mammogram  Facilities in [place]: 1)  [facility]. [address] [place] Phone: [phone] 2)  Dr. Isaiah Blakes at [facility] [address] Phone: [phone]     Mammogram A mammogram is an X-ray test to find changes in a woman's breast. You should get a mammogram if:  You are 53 years of age or older  You have risk factors.   Your doctor recommends that you have one.  BEFORE THE TEST  Do not schedule the test the week before your period, especially if your breasts are sore during this time.  On the day of your mammogram:  Wash your breasts and armpits well. After washing, do not put on any deodorant or talcum powder on until after your test.   Eat and drink as you usually do.   Take your medicines as usual.   If you are diabetic and take insulin, make sure you:   Eat before coming for your test.   Take your insulin as usual.   If you cannot keep your appointment, call before the appointment to cancel. Schedule another appointment.  TEST  You will need to undress from the waist up. You will put on a hospital gown.   Your breast will be put on the mammogram machine, and it will press firmly on your breast with a piece of plastic called a compression paddle. This will make your breast flatter so that the machine can X-ray all parts of your breast.   Both breasts will be X-rayed. Each breast will be X-rayed from above and from the side. An X-ray might need to be taken again if the picture is not good enough.   The mammogram will last about 15 to 30 minutes.  AFTER THE TEST Finding out the results of your test Ask when your test results will be ready. Make sure you get your test results.  Document Released: 05/12/2008 Document Revised: 02/02/2011 Document Reviewed: 05/12/2008 [facility] Patient Information 2012 [place].  You  may obtain a copy of any labs that were done today by logging onto MyChart as outlined in the instructions provided with your AVS (after visit summary). The office will not call with normal lab results but certainly if there are any significant abnormalities then we will contact you.   Health Maintenance Adopting a healthy lifestyle and getting preventive care can go a long way to promote health and wellness. Talk with your health care provider about what schedule of regular examinations is right for you. This is a good chance for you to check in with your provider about disease prevention and staying healthy. In between checkups, there are plenty of things you can do on your own. Experts have done a lot of research about which lifestyle changes and preventive measures are most likely to keep you healthy. Ask your health care provider for more information. WEIGHT AND DIET  Eat a healthy diet  Be sure to include plenty of vegetables, fruits, low-fat dairy products, and lean protein.  Do not eat a lot of foods high in solid fats, added sugars, or salt.  Get regular exercise. This is one of the most important things you can do for your health.  Most adults should exercise for at least 150 minutes each week. The exercise should increase your heart rate and make you sweat (moderate-intensity exercise).  Most adults should also  do strengthening exercises at least twice a week. This is in addition to the moderate-intensity exercise.  Maintain a healthy weight  Body mass index (BMI) is a measurement that can be used to identify possible weight problems. It estimates body fat based on height and weight. Your health care provider can help determine your BMI and help you achieve or maintain a healthy weight.  For females 2 years of age and older:   A BMI below 18.5 is considered underweight.  A BMI of 18.5 to 24.9 is normal.  A BMI of 25 to 29.9 is considered overweight.  A BMI of 30 and above  is considered obese.  Watch levels of cholesterol and blood lipids  You should start having your blood tested for lipids and cholesterol at 53 years of age, then have this test every 5 years.  You may need to have your cholesterol levels checked more often if:  Your lipid or cholesterol levels are high.  You are older than 53 years of age.  You are at high risk for heart disease.  CANCER SCREENING   Lung Cancer  Lung cancer screening is recommended for adults 38-62 years old who are at high risk for lung cancer because of a history of smoking.  A yearly low-dose CT scan of the lungs is recommended for people who:  Currently smoke.  Have quit within the past 15 years.  Have at least a 30-pack-year history of smoking. A pack year is smoking an average of one pack of cigarettes a day for 1 year.  Yearly screening should continue until it has been 15 years since you quit.  Yearly screening should stop if you develop a health problem that would prevent you from having lung cancer treatment.  Breast Cancer  Practice breast self-awareness. This means understanding how your breasts normally appear and feel.  It also means doing regular breast self-exams. Let your health care provider know about any changes, no matter how small.  If you are in your 20s or 30s, you should have a clinical breast exam (CBE) by a health care provider every 1-3 years as part of a regular health exam.  If you are 66 or older, have a CBE every year. Also consider having a breast X-ray (mammogram) every year.  If you have a family history of breast cancer, talk to your health care provider about genetic screening.  If you are at high risk for breast cancer, talk to your health care provider about having an MRI and a mammogram every year.  Breast cancer gene (BRCA) assessment is recommended for women who have family members with BRCA-related cancers. BRCA-related cancers  include:  Breast.  Ovarian.  Tubal.  Peritoneal cancers.  Results of the assessment will determine the need for genetic counseling and BRCA1 and BRCA2 testing. Cervical Cancer Routine pelvic examinations to screen for cervical cancer are no longer recommended for nonpregnant women who are considered low risk for cancer of the pelvic organs (ovaries, uterus, and vagina) and who do not have symptoms. A pelvic examination may be necessary if you have symptoms including those associated with pelvic infections. Ask your health care provider if a screening pelvic exam is right for you.   The Pap test is the screening test for cervical cancer for women who are considered at risk.  If you had a hysterectomy for a problem that was not cancer or a condition that could lead to cancer, then you no longer need Pap tests.  If  you are older than 65 years, and you have had normal Pap tests for the past 10 years, you no longer need to have Pap tests.  If you have had past treatment for cervical cancer or a condition that could lead to cancer, you need Pap tests and screening for cancer for at least 20 years after your treatment.  If you no longer get a Pap test, assess your risk factors if they change (such as having a new sexual partner). This can affect whether you should start being screened again.  Some women have medical problems that increase their chance of getting cervical cancer. If this is the case for you, your health care provider may recommend more frequent screening and Pap tests.  The human papillomavirus (HPV) test is another test that may be used for cervical cancer screening. The HPV test looks for the virus that can cause cell changes in the cervix. The cells collected during the Pap test can be tested for HPV.  The HPV test can be used to screen women 89 years of age and older. Getting tested for HPV can extend the interval between normal Pap tests from three to five years.  An HPV  test also should be used to screen women of any age who have unclear Pap test results.  After 53 years of age, women should have HPV testing as often as Pap tests.  Colorectal Cancer  This type of cancer can be detected and often prevented.  Routine colorectal cancer screening usually begins at 53 years of age and continues through 53 years of age.  Your health care provider may recommend screening at an earlier age if you have risk factors for colon cancer.  Your health care provider may also recommend using home test kits to check for hidden blood in the stool.  A small camera at the end of a tube can be used to examine your colon directly (sigmoidoscopy or colonoscopy). This is done to check for the earliest forms of colorectal cancer.  Routine screening usually begins at age 82.  Direct examination of the colon should be repeated every 5-10 years through 53 years of age. However, you may need to be screened more often if early forms of precancerous polyps or small growths are found. Skin Cancer  Check your skin from head to toe regularly.  Tell your health care provider about any new moles or changes in moles, especially if there is a change in a mole's shape or color.  Also tell your health care provider if you have a mole that is larger than the size of a pencil eraser.  Always use sunscreen. Apply sunscreen liberally and repeatedly throughout the day.  Protect yourself by wearing long sleeves, pants, a wide-brimmed hat, and sunglasses whenever you are outside. HEART DISEASE, DIABETES, AND HIGH BLOOD PRESSURE   Have your blood pressure checked at least every 1-2 years. High blood pressure causes heart disease and increases the risk of stroke.  If you are between 67 years and 100 years old, ask your health care provider if you should take aspirin to prevent strokes.  Have regular diabetes screenings. This involves taking a blood sample to check your fasting blood sugar  level.  If you are at a normal weight and have a low risk for diabetes, have this test once every three years after 53 years of age.  If you are overweight and have a high risk for diabetes, consider being tested at a younger age or  more often. PREVENTING INFECTION  Hepatitis B  If you have a higher risk for hepatitis B, you should be screened for this virus. You are considered at high risk for hepatitis B if:  You were born in a country where hepatitis B is common. Ask your health care provider which countries are considered high risk.  Your parents were born in a high-risk country, and you have not been immunized against hepatitis B (hepatitis B vaccine).  You have HIV or AIDS.  You use needles to inject street drugs.  You live with someone who has hepatitis B.  You have had sex with someone who has hepatitis B.  You get hemodialysis treatment.  You take certain medicines for conditions, including cancer, organ transplantation, and autoimmune conditions. Hepatitis C  Blood testing is recommended for:  Everyone born from [birth year] through 1963.  Anyone with known risk factors for hepatitis C. Sexually transmitted infections (STIs)  You should be screened for sexually transmitted infections (STIs) including gonorrhea and chlamydia if:  You are sexually active and are younger than 53 years of age.  You are older than 53 years of age and your health care provider tells you that you are at risk for this type of infection.  Your sexual activity has changed since you were last screened and you are at an increased risk for chlamydia or gonorrhea. Ask your health care provider if you are at risk.  If you do not have HIV, but are at risk, it may be recommended that you take a prescription medicine daily to prevent HIV infection. This is called pre-exposure prophylaxis (PrEP). You are considered at risk if:  You are sexually active and do not regularly use condoms or know the HIV status  of your partner(s).  You take drugs by injection.  You are sexually active with a partner who has HIV. Talk with your health care provider about whether you are at high risk of being infected with HIV. If you choose to begin PrEP, you should first be tested for HIV. You should then be tested every 3 months for as long as you are taking PrEP.  PREGNANCY   If you are premenopausal and you may become pregnant, ask your health care provider about preconception counseling.  If you may become pregnant, take 400 to 800 micrograms (mcg) of folic acid every day.  If you want to prevent pregnancy, talk to your health care provider about birth control (contraception). OSTEOPOROSIS AND MENOPAUSE   Osteoporosis is a disease in which the bones lose minerals and strength with aging. This can result in serious bone fractures. Your risk for osteoporosis can be identified using a bone density scan.  If you are 57 years of age or older, or if you are at risk for osteoporosis and fractures, ask your health care provider if you should be screened.  Ask your health care provider whether you should take a calcium or vitamin D supplement to lower your risk for osteoporosis.  Menopause may have certain physical symptoms and risks.  Hormone replacement therapy may reduce some of these symptoms and risks. Talk to your health care provider about whether hormone replacement therapy is right for you.  HOME CARE INSTRUCTIONS   Schedule regular health, dental, and eye exams.  Stay current with your immunizations.   Do not use any tobacco products including cigarettes, chewing tobacco, or electronic cigarettes.  If you are pregnant, do not drink alcohol.  If you are breastfeeding, limit how much and  how often you drink alcohol.  Limit alcohol intake to no more than 1 drink per day for nonpregnant women. One drink equals 12 ounces of beer, 5 ounces of wine, or 1 ounces of hard liquor.  Do not use street  drugs.  Do not share needles.  Ask your health care provider for help if you need support or information about quitting drugs.  Tell your health care provider if you often feel depressed.  Tell your health care provider if you have ever been abused or do not feel safe at home. Document Released: 08/29/2010 Document Revised: 06/30/2013 Document Reviewed: 01/15/2013 [facility] Patient Information 2015 [place], [place]. This information is not intended to replace advice given to you by your health care provider. Make sure you discuss any questions you have with your health care provider.

## 2015-03-03 NOTE — Progress Notes (Signed)
Danielle Bruce 11-01-1960 [id number]        55 y.o.  [id number]  for breast and pelvic exam. Several issues noted below.  Past medical history,surgical history, problem list, medications, allergies, family history and social history were all reviewed and documented as reviewed in the EPIC chart.  ROS:  Performed with pertinent positives and negatives included in the history, assessment and plan.   Additional significant findings :  Chronic right lower quadrant pain is discussed below   Exam: Danielle Bruce assistant Filed Vitals:   03/03/15 1505  BP: 118/74  Height: 5\' 2"  (1.575 m)  Weight: 121 lb (54.885 kg)   General appearance:  Normal affect, orientation and appearance. Skin: Grossly normal HEENT: Without gross lesions.  No cervical or supraclavicular adenopathy. Thyroid normal.  Lungs:  Clear without wheezing, rales or rhonchi Cardiac: RR, without RMG Abdominal:  Soft, nontender, without masses, guarding, rebound, organomegaly or hernia Breasts:  Examined lying and sitting without masses, retractions, discharge or axillary adenopathy. Pelvic:  Ext/BUS/vagina normal with mild atrophic changes  Cervix normal with IUD string visualized. The IUD strings were grasped with the [facility] forcep and her Mirena IUD was removed without difficulty, shown to the patient and discarded  Uterus anteverted, normal size, shape and contour, midline and mobile nontender   Adnexa  Without masses or tenderness    Anus and perineum  Normal   Rectovaginal  Normal sphincter tone without palpated masses or tenderness.    Assessment/Plan:  55 y.o. [id number] female for breast and pelvic exam.   1. Chronic right lower quadrant pain. Recently had endoscopy and colonoscopy. Was told that they thought was GI in origin. She does have a friend who is dying of ovarian cancer and she is fearful that might be ovarian in nature. Her exam is normal today. Recommended baseline ultrasound for reassurance. Patient  agrees to schedule and follow up for this. 2. Postmenopausal. FSH 99 two years ago.  Not having significant hot flashes, night sweats, vaginal dryness or any vaginal bleeding. Continue monitoring report any issues or vaginal bleeding. 3. Mirena IUD 09/2009.  Doing no bleeding.  Her IUD was removed as noted above. As long as no bleeding then will follow expectantly.  Currently not sexually active. 4. Pap smear/HPV negative 2015. No Pap smear done today. History of positive HPV with normal cytology 2013. Pap smear 2014/2015 both negative cytology and negative HPV. Plan repeat Pap smear in several years per current screening guidelines. 5. Mammography due now and I reminded the patient to schedule. Names and numbers provided for her to call. SBE monthly reviewed. 6. DEXA reportedly normal 2014 through her primary physician's office.  I do not have copies of this. She'll repeat at their recommended interval. 7. Health maintenance. No routine lab work done as patient has this done at her primary physician's office. Follow up for ultrasound otherwise annually.   Danielle Auerbach MD, 3:30 PM 03/03/2015

## 2015-03-05 DIAGNOSIS — F3113 Bipolar disorder, current episode manic without psychotic features, severe: Secondary | ICD-10-CM | POA: Diagnosis not present

## 2015-03-09 ENCOUNTER — Other Ambulatory Visit: Payer: Self-pay

## 2015-03-09 DIAGNOSIS — J301 Allergic rhinitis due to pollen: Secondary | ICD-10-CM | POA: Diagnosis not present

## 2015-03-09 DIAGNOSIS — J3081 Allergic rhinitis due to animal (cat) (dog) hair and dander: Secondary | ICD-10-CM | POA: Diagnosis not present

## 2015-03-09 DIAGNOSIS — J3089 Other allergic rhinitis: Secondary | ICD-10-CM | POA: Diagnosis not present

## 2015-03-09 DIAGNOSIS — Z1231 Encounter for screening mammogram for malignant neoplasm of breast: Secondary | ICD-10-CM

## 2015-03-15 DIAGNOSIS — J3089 Other allergic rhinitis: Secondary | ICD-10-CM | POA: Diagnosis not present

## 2015-03-15 DIAGNOSIS — J301 Allergic rhinitis due to pollen: Secondary | ICD-10-CM | POA: Diagnosis not present

## 2015-03-15 DIAGNOSIS — J3081 Allergic rhinitis due to animal (cat) (dog) hair and dander: Secondary | ICD-10-CM | POA: Diagnosis not present

## 2015-03-22 ENCOUNTER — Ambulatory Visit (INDEPENDENT_AMBULATORY_CARE_PROVIDER_SITE_OTHER): Payer: Medicare Other | Admitting: Gynecology

## 2015-03-22 ENCOUNTER — Ambulatory Visit (INDEPENDENT_AMBULATORY_CARE_PROVIDER_SITE_OTHER): Payer: Medicare Other

## 2015-03-22 ENCOUNTER — Other Ambulatory Visit: Payer: Self-pay | Admitting: Gynecology

## 2015-03-22 ENCOUNTER — Encounter: Payer: Self-pay | Admitting: Gynecology

## 2015-03-22 VITALS — BP 114/66

## 2015-03-22 DIAGNOSIS — G8929 Other chronic pain: Secondary | ICD-10-CM | POA: Diagnosis not present

## 2015-03-22 DIAGNOSIS — N858 Other specified noninflammatory disorders of uterus: Secondary | ICD-10-CM

## 2015-03-22 DIAGNOSIS — R1031 Right lower quadrant pain: Secondary | ICD-10-CM | POA: Diagnosis not present

## 2015-03-22 DIAGNOSIS — J3081 Allergic rhinitis due to animal (cat) (dog) hair and dander: Secondary | ICD-10-CM | POA: Diagnosis not present

## 2015-03-22 DIAGNOSIS — J301 Allergic rhinitis due to pollen: Secondary | ICD-10-CM | POA: Diagnosis not present

## 2015-03-22 DIAGNOSIS — J3089 Other allergic rhinitis: Secondary | ICD-10-CM | POA: Diagnosis not present

## 2015-03-22 NOTE — Progress Notes (Signed)
Danielle Bruce 1960-04-15 [id number]        55 y.o.  [id number] presents for ultrasound. History of chronic right lower quadrant pain thought to be of GI etiology. Patient has friend dying of ovarian cancer she was very concerned about that possibility as the source of her pain.  Past medical history,surgical history, problem list, medications, allergies, family history and social history were all reviewed and documented in the EPIC chart.  Directed ROS with pertinent positives and negatives documented in the history of present illness/assessment and plan.  Exam: Filed Vitals:   03/22/15 1232  BP: 114/66   General appearance:  Normal  Ultrasound shows uterus normal size and echotexture. Endometrial echo 2.8 mm. Right and left ovaries visualized and normal/postmenopausal. Cul-de-sac negative.  Assessment/Plan:  55 y.o. [id number] with normal GYN ultrasound. Chronic right lower quadrant pain thought to be GI. Has undergone a full GI workup by her history.  Will follow up with her gastroenterologist as needed in reference to her pain.    Anastasio Auerbach MD, 12:43 PM 03/22/2015

## 2015-03-22 NOTE — Patient Instructions (Signed)
Follow up routinely when you're due for your annual exam next year.

## 2015-04-01 ENCOUNTER — Ambulatory Visit
Admission: RE | Admit: 2015-04-01 | Discharge: 2015-04-01 | Disposition: A | Discharge status: Home / Self Care | Payer: Medicare Other | Source: Ambulatory Visit

## 2015-04-01 DIAGNOSIS — K589 Irritable bowel syndrome without diarrhea: Secondary | ICD-10-CM | POA: Diagnosis not present

## 2015-04-01 DIAGNOSIS — H1010 Acute atopic conjunctivitis, unspecified eye: Secondary | ICD-10-CM | POA: Diagnosis not present

## 2015-04-01 DIAGNOSIS — K219 Gastro-esophageal reflux disease without esophagitis: Secondary | ICD-10-CM | POA: Diagnosis not present

## 2015-04-01 DIAGNOSIS — Z1231 Encounter for screening mammogram for malignant neoplasm of breast: Secondary | ICD-10-CM | POA: Diagnosis not present

## 2015-04-01 DIAGNOSIS — G43909 Migraine, unspecified, not intractable, without status migrainosus: Secondary | ICD-10-CM | POA: Diagnosis not present

## 2015-04-01 DIAGNOSIS — F3181 Bipolar II disorder: Secondary | ICD-10-CM | POA: Diagnosis not present

## 2015-04-01 DIAGNOSIS — G4700 Insomnia, unspecified: Secondary | ICD-10-CM | POA: Diagnosis not present

## 2015-04-01 DIAGNOSIS — F411 Generalized anxiety disorder: Secondary | ICD-10-CM | POA: Diagnosis not present

## 2015-04-01 DIAGNOSIS — F4310 Post-traumatic stress disorder, unspecified: Secondary | ICD-10-CM | POA: Diagnosis not present

## 2015-04-01 DIAGNOSIS — E7800 Pure hypercholesterolemia, unspecified: Secondary | ICD-10-CM | POA: Diagnosis not present

## 2015-04-01 DIAGNOSIS — M797 Fibromyalgia: Secondary | ICD-10-CM | POA: Diagnosis not present

## 2015-04-01 DIAGNOSIS — G629 Polyneuropathy, unspecified: Secondary | ICD-10-CM | POA: Diagnosis not present

## 2015-04-01 DIAGNOSIS — K5289 Other specified noninfective gastroenteritis and colitis: Secondary | ICD-10-CM | POA: Diagnosis not present

## 2015-04-05 DIAGNOSIS — J3081 Allergic rhinitis due to animal (cat) (dog) hair and dander: Secondary | ICD-10-CM | POA: Diagnosis not present

## 2015-04-05 DIAGNOSIS — J3089 Other allergic rhinitis: Secondary | ICD-10-CM | POA: Diagnosis not present

## 2015-04-05 DIAGNOSIS — J301 Allergic rhinitis due to pollen: Secondary | ICD-10-CM | POA: Diagnosis not present

## 2015-04-12 DIAGNOSIS — J301 Allergic rhinitis due to pollen: Secondary | ICD-10-CM | POA: Diagnosis not present

## 2015-04-12 DIAGNOSIS — J3081 Allergic rhinitis due to animal (cat) (dog) hair and dander: Secondary | ICD-10-CM | POA: Diagnosis not present

## 2015-04-12 DIAGNOSIS — J3089 Other allergic rhinitis: Secondary | ICD-10-CM | POA: Diagnosis not present

## 2015-04-16 DIAGNOSIS — F3113 Bipolar disorder, current episode manic without psychotic features, severe: Secondary | ICD-10-CM | POA: Diagnosis not present

## 2015-04-19 DIAGNOSIS — J3089 Other allergic rhinitis: Secondary | ICD-10-CM | POA: Diagnosis not present

## 2015-04-19 DIAGNOSIS — J3081 Allergic rhinitis due to animal (cat) (dog) hair and dander: Secondary | ICD-10-CM | POA: Diagnosis not present

## 2015-04-19 DIAGNOSIS — J301 Allergic rhinitis due to pollen: Secondary | ICD-10-CM | POA: Diagnosis not present

## 2015-04-21 DIAGNOSIS — J301 Allergic rhinitis due to pollen: Secondary | ICD-10-CM | POA: Diagnosis not present

## 2015-04-21 DIAGNOSIS — J019 Acute sinusitis, unspecified: Secondary | ICD-10-CM | POA: Diagnosis not present

## 2015-04-21 DIAGNOSIS — J3089 Other allergic rhinitis: Secondary | ICD-10-CM | POA: Diagnosis not present

## 2015-04-21 DIAGNOSIS — J4530 Mild persistent asthma, uncomplicated: Secondary | ICD-10-CM | POA: Diagnosis not present

## 2015-04-26 DIAGNOSIS — E7800 Pure hypercholesterolemia, unspecified: Secondary | ICD-10-CM | POA: Diagnosis not present

## 2015-04-26 DIAGNOSIS — K5289 Other specified noninfective gastroenteritis and colitis: Secondary | ICD-10-CM | POA: Diagnosis not present

## 2015-04-29 DIAGNOSIS — K589 Irritable bowel syndrome without diarrhea: Secondary | ICD-10-CM | POA: Diagnosis not present

## 2015-04-29 DIAGNOSIS — J069 Acute upper respiratory infection, unspecified: Secondary | ICD-10-CM | POA: Diagnosis not present

## 2015-04-29 DIAGNOSIS — M797 Fibromyalgia: Secondary | ICD-10-CM | POA: Diagnosis not present

## 2015-04-29 DIAGNOSIS — G4700 Insomnia, unspecified: Secondary | ICD-10-CM | POA: Diagnosis not present

## 2015-04-29 DIAGNOSIS — F411 Generalized anxiety disorder: Secondary | ICD-10-CM | POA: Diagnosis not present

## 2015-04-29 DIAGNOSIS — G629 Polyneuropathy, unspecified: Secondary | ICD-10-CM | POA: Diagnosis not present

## 2015-04-29 DIAGNOSIS — D472 Monoclonal gammopathy: Secondary | ICD-10-CM | POA: Diagnosis not present

## 2015-04-29 DIAGNOSIS — F3181 Bipolar II disorder: Secondary | ICD-10-CM | POA: Diagnosis not present

## 2015-04-29 DIAGNOSIS — G43909 Migraine, unspecified, not intractable, without status migrainosus: Secondary | ICD-10-CM | POA: Diagnosis not present

## 2015-05-04 DIAGNOSIS — R350 Frequency of micturition: Secondary | ICD-10-CM | POA: Diagnosis not present

## 2015-05-13 DIAGNOSIS — J3081 Allergic rhinitis due to animal (cat) (dog) hair and dander: Secondary | ICD-10-CM | POA: Diagnosis not present

## 2015-05-13 DIAGNOSIS — J301 Allergic rhinitis due to pollen: Secondary | ICD-10-CM | POA: Diagnosis not present

## 2015-05-13 DIAGNOSIS — J3089 Other allergic rhinitis: Secondary | ICD-10-CM | POA: Diagnosis not present

## 2015-05-14 DIAGNOSIS — F3113 Bipolar disorder, current episode manic without psychotic features, severe: Secondary | ICD-10-CM | POA: Diagnosis not present

## 2015-05-18 DIAGNOSIS — J3081 Allergic rhinitis due to animal (cat) (dog) hair and dander: Secondary | ICD-10-CM | POA: Diagnosis not present

## 2015-05-18 DIAGNOSIS — J301 Allergic rhinitis due to pollen: Secondary | ICD-10-CM | POA: Diagnosis not present

## 2015-05-18 DIAGNOSIS — J3089 Other allergic rhinitis: Secondary | ICD-10-CM | POA: Diagnosis not present

## 2015-05-28 DIAGNOSIS — J3089 Other allergic rhinitis: Secondary | ICD-10-CM | POA: Diagnosis not present

## 2015-05-28 DIAGNOSIS — J301 Allergic rhinitis due to pollen: Secondary | ICD-10-CM | POA: Diagnosis not present

## 2015-05-28 DIAGNOSIS — J3081 Allergic rhinitis due to animal (cat) (dog) hair and dander: Secondary | ICD-10-CM | POA: Diagnosis not present

## 2015-06-01 DIAGNOSIS — M797 Fibromyalgia: Secondary | ICD-10-CM | POA: Diagnosis not present

## 2015-06-01 DIAGNOSIS — R5381 Other malaise: Secondary | ICD-10-CM | POA: Diagnosis not present

## 2015-06-01 DIAGNOSIS — G4709 Other insomnia: Secondary | ICD-10-CM | POA: Diagnosis not present

## 2015-06-04 DIAGNOSIS — J3089 Other allergic rhinitis: Secondary | ICD-10-CM | POA: Diagnosis not present

## 2015-06-04 DIAGNOSIS — J301 Allergic rhinitis due to pollen: Secondary | ICD-10-CM | POA: Diagnosis not present

## 2015-06-04 DIAGNOSIS — J3081 Allergic rhinitis due to animal (cat) (dog) hair and dander: Secondary | ICD-10-CM | POA: Diagnosis not present

## 2015-06-10 DIAGNOSIS — K5289 Other specified noninfective gastroenteritis and colitis: Secondary | ICD-10-CM | POA: Diagnosis not present

## 2015-06-10 DIAGNOSIS — J3089 Other allergic rhinitis: Secondary | ICD-10-CM | POA: Diagnosis not present

## 2015-06-10 DIAGNOSIS — J301 Allergic rhinitis due to pollen: Secondary | ICD-10-CM | POA: Diagnosis not present

## 2015-06-10 DIAGNOSIS — M797 Fibromyalgia: Secondary | ICD-10-CM | POA: Diagnosis not present

## 2015-06-10 DIAGNOSIS — D472 Monoclonal gammopathy: Secondary | ICD-10-CM | POA: Diagnosis not present

## 2015-06-10 DIAGNOSIS — G629 Polyneuropathy, unspecified: Secondary | ICD-10-CM | POA: Diagnosis not present

## 2015-06-10 DIAGNOSIS — F411 Generalized anxiety disorder: Secondary | ICD-10-CM | POA: Diagnosis not present

## 2015-06-10 DIAGNOSIS — G4700 Insomnia, unspecified: Secondary | ICD-10-CM | POA: Diagnosis not present

## 2015-06-10 DIAGNOSIS — J3081 Allergic rhinitis due to animal (cat) (dog) hair and dander: Secondary | ICD-10-CM | POA: Diagnosis not present

## 2015-06-10 DIAGNOSIS — K589 Irritable bowel syndrome without diarrhea: Secondary | ICD-10-CM | POA: Diagnosis not present

## 2015-06-10 DIAGNOSIS — F3181 Bipolar II disorder: Secondary | ICD-10-CM | POA: Diagnosis not present

## 2015-06-10 DIAGNOSIS — G43909 Migraine, unspecified, not intractable, without status migrainosus: Secondary | ICD-10-CM | POA: Diagnosis not present

## 2015-06-10 DIAGNOSIS — Z79899 Other long term (current) drug therapy: Secondary | ICD-10-CM | POA: Diagnosis not present

## 2015-06-14 DIAGNOSIS — F3113 Bipolar disorder, current episode manic without psychotic features, severe: Secondary | ICD-10-CM | POA: Diagnosis not present

## 2015-06-17 DIAGNOSIS — J3081 Allergic rhinitis due to animal (cat) (dog) hair and dander: Secondary | ICD-10-CM | POA: Diagnosis not present

## 2015-06-25 DIAGNOSIS — J301 Allergic rhinitis due to pollen: Secondary | ICD-10-CM | POA: Diagnosis not present

## 2015-06-25 DIAGNOSIS — J3089 Other allergic rhinitis: Secondary | ICD-10-CM | POA: Diagnosis not present

## 2015-06-25 DIAGNOSIS — J3081 Allergic rhinitis due to animal (cat) (dog) hair and dander: Secondary | ICD-10-CM | POA: Diagnosis not present

## 2015-06-29 DIAGNOSIS — B029 Zoster without complications: Secondary | ICD-10-CM | POA: Diagnosis not present

## 2015-06-29 DIAGNOSIS — L509 Urticaria, unspecified: Secondary | ICD-10-CM | POA: Diagnosis not present

## 2015-07-12 ENCOUNTER — Other Ambulatory Visit (HOSPITAL_BASED_OUTPATIENT_CLINIC_OR_DEPARTMENT_OTHER): Payer: Medicare Other

## 2015-07-12 DIAGNOSIS — J3081 Allergic rhinitis due to animal (cat) (dog) hair and dander: Secondary | ICD-10-CM | POA: Diagnosis not present

## 2015-07-12 DIAGNOSIS — J3089 Other allergic rhinitis: Secondary | ICD-10-CM | POA: Diagnosis not present

## 2015-07-12 DIAGNOSIS — D472 Monoclonal gammopathy: Secondary | ICD-10-CM

## 2015-07-12 DIAGNOSIS — J301 Allergic rhinitis due to pollen: Secondary | ICD-10-CM | POA: Diagnosis not present

## 2015-07-12 LAB — COMPREHENSIVE METABOLIC PANEL
ALT: 31 U/L (ref 0–55)
AST: 22 U/L (ref 5–34)
Albumin: 4.4 g/dL (ref 3.5–5.0)
Alkaline Phosphatase: 54 U/L (ref 40–150)
Anion Gap: 7 mEq/L (ref 3–11)
BUN: 10.3 mg/dL (ref 7.0–26.0)
CO2: 27 mEq/L (ref 22–29)
Calcium: 9.9 mg/dL (ref 8.4–10.4)
Chloride: 110 mEq/L — ABNORMAL HIGH (ref 98–109)
Creatinine: 0.8 mg/dL (ref 0.6–1.1)
EGFR: 86 mL/min/{1.73_m2} — ABNORMAL LOW (ref >90)
Glucose: 82 mg/dl (ref 70–140)
Potassium: 3.8 mEq/L (ref 3.5–5.1)
Sodium: 144 mEq/L (ref 136–145)
Total Bilirubin: 0.4 mg/dL (ref 0.20–1.20)
Total Protein: 7.4 g/dL (ref 6.4–8.3)

## 2015-07-12 LAB — CBC WITH DIFFERENTIAL/PLATELET
BASO%: 0.3 % (ref 0.0–2.0)
Basophils Absolute: 0 10*3/uL (ref 0.0–0.1)
EOS%: 1.1 % (ref 0.0–7.0)
Eosinophils Absolute: 0.1 10*3/uL (ref 0.0–0.5)
HCT: 41.5 % (ref 34.8–46.6)
HGB: 14.1 g/dL (ref 11.6–15.9)
LYMPH%: 25.8 % (ref 14.0–49.7)
MCH: 31.3 pg (ref 25.1–34.0)
MCHC: 34 g/dL (ref 31.5–36.0)
MCV: 92.2 fL (ref 79.5–101.0)
MONO#: 0.7 10*3/uL (ref 0.1–0.9)
MONO%: 10.3 % (ref 0.0–14.0)
NEUT#: 4.4 10*3/uL (ref 1.5–6.5)
NEUT%: 62.5 % (ref 38.4–76.8)
Platelets: 248 10*3/uL (ref 145–400)
RBC: 4.5 10*6/uL (ref 3.70–5.45)
RDW: 13.3 % (ref 11.2–14.5)
WBC: 7 10*3/uL (ref 3.9–10.3)
lymph#: 1.8 10*3/uL (ref 0.9–3.3)

## 2015-07-12 LAB — LACTATE DEHYDROGENASE: LDH: 161 U/L (ref 125–245)

## 2015-07-13 DIAGNOSIS — H1045 Other chronic allergic conjunctivitis: Secondary | ICD-10-CM | POA: Diagnosis not present

## 2015-07-13 DIAGNOSIS — J3089 Other allergic rhinitis: Secondary | ICD-10-CM | POA: Diagnosis not present

## 2015-07-13 DIAGNOSIS — J3081 Allergic rhinitis due to animal (cat) (dog) hair and dander: Secondary | ICD-10-CM | POA: Diagnosis not present

## 2015-07-13 DIAGNOSIS — J4530 Mild persistent asthma, uncomplicated: Secondary | ICD-10-CM | POA: Diagnosis not present

## 2015-07-13 DIAGNOSIS — J301 Allergic rhinitis due to pollen: Secondary | ICD-10-CM | POA: Diagnosis not present

## 2015-07-13 LAB — KAPPA/LAMBDA LIGHT CHAINS
Ig Kappa Free Light Chain: 9.27 mg/L (ref 3.30–19.40)
Ig Lambda Free Light Chain: 9.69 mg/L (ref 5.71–26.30)
Kappa/Lambda FluidC Ratio: 0.96 (ref 0.26–1.65)

## 2015-07-13 LAB — IGG, IGA, IGM
IgA, Qn, Serum: 236 mg/dL (ref 87–352)
IgG, Qn, Serum: 554 mg/dL — ABNORMAL LOW (ref 700–1600)
IgM, Qn, Serum: 60 mg/dL (ref 26–217)

## 2015-07-13 LAB — BETA 2 MICROGLOBULIN, SERUM: Beta-2: 1.3 mg/L (ref 0.6–2.4)

## 2015-07-19 ENCOUNTER — Encounter: Payer: Self-pay | Admitting: Internal Medicine

## 2015-07-19 ENCOUNTER — Ambulatory Visit (HOSPITAL_BASED_OUTPATIENT_CLINIC_OR_DEPARTMENT_OTHER): Payer: Medicare Other | Admitting: Internal Medicine

## 2015-07-19 VITALS — BP 118/56 | HR 80 | Temp 98.6°F | Resp 18 | Ht 62.0 in | Wt 123.4 lb

## 2015-07-19 DIAGNOSIS — K589 Irritable bowel syndrome without diarrhea: Secondary | ICD-10-CM | POA: Diagnosis not present

## 2015-07-19 DIAGNOSIS — D472 Monoclonal gammopathy: Secondary | ICD-10-CM | POA: Diagnosis not present

## 2015-07-19 DIAGNOSIS — F319 Bipolar disorder, unspecified: Secondary | ICD-10-CM

## 2015-07-19 DIAGNOSIS — M797 Fibromyalgia: Secondary | ICD-10-CM | POA: Diagnosis not present

## 2015-07-19 DIAGNOSIS — M791 Myalgia: Secondary | ICD-10-CM | POA: Diagnosis not present

## 2015-07-19 DIAGNOSIS — F418 Other specified anxiety disorders: Secondary | ICD-10-CM | POA: Diagnosis not present

## 2015-07-19 NOTE — Progress Notes (Signed)
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 2) fibromyalgia. 3) bipolar disorder  PRIOR THERAPY: None  CURRENT THERAPY: Observation  INTERVAL HISTORY: Danielle Bruce 55 y.o. female returns to the clinic today for annual follow-up visit.  She has been on observation. She is feeling fine today with no specific complaints. She denied having any significant fever or chills, no nausea or vomiting. She denied having any significant chest pain, shortness of breath, cough or hemoptysis. She is followed by Dr. Estanislado Pandy for the fibromyalgia and inflammatory process. She is currently on Neurontin 300 mg by mouth twice a day. The patient had repeat myeloma panel performed earlier today and she is here for evaluation and discussion of her condition.  MEDICAL HISTORY: Past Medical History  Diagnosis Date  . GERD (gastroesophageal reflux disease)   . 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 Migraines   . Bipolar 2 disorder ([place])   . IBS (irritable bowel syndrome)   . Constipation   . ASCUS (atypical squamous cells of undetermined significance) on Pap smear 05/06/05    NEG HIGH RISK HPV--C&B BIOPSY BENIGN 12/2005  . Anxiety   . 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    cytology negative  . Fibromyalgia   . 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    Bone marrow biopsy showes 8% plasma cells IgA Lambda  . Fibromyalgia 10/2013  . PTSD (post-traumatic stress disorder)     ALLERGIES:  is allergic to sulfa antibiotics; flagyl; gluten meal; lactose intolerance (gi); and morphine and related.  MEDICATIONS:  Current Outpatient Prescriptions  Medication Sig Dispense Refill  . Ascorbic Acid (VITAMIN C) 1000 MG tablet Take 1,000 mg by mouth at bedtime.    Marland Kitchen azelastine  (ASTELIN) 0.1 % nasal spray Place 2 sprays into both nostrils 2 (two) times daily. Use in each nostril as directed    . BEPREVE 1.5 % SOLN Place 1 drop into both eyes daily.     Marland Kitchen BREO ELLIPTA 200-25 MCG/INH AEPB     . Calcium Carbonate-Vitamin D (CALCIUM + D PO) Take 1 tablet by mouth daily.    . cholecalciferol (VITAMIN D) 1000 UNITS tablet Take 2,000 Units by mouth daily.    . cyclobenzaprine (FLEXERIL) 10 MG tablet Take 10 mg by mouth 3 (three) times daily as needed for muscle spasms.    Marland Kitchen dexlansoprazole (DEXILANT) 60 MG capsule Take 60 mg by mouth daily.    Marland Kitchen dicyclomine (BENTYL) 10 MG capsule Take 1 capsule by mouth 4 (four) times daily as needed for spasms.   0  . diphenhydrAMINE (BENADRYL) 25 mg capsule Take 50 mg by mouth every 6 (six) hours as needed for itching or allergies.     Marland Kitchen docusate sodium (COLACE) 100 MG capsule Take 100 mg by mouth every other day. Alternates with the senokot    . fluconazole (DIFLUCAN) 150 MG tablet   0  . fluticasone (FLONASE) 50 MCG/ACT nasal spray Place 2 sprays into both nostrils daily. 16 g 0  . lamoTRIgine (LAMICTAL) 200 MG tablet Take 200 mg by mouth at bedtime.    Marland Kitchen levocetirizine (XYZAL) 5 MG tablet Take 5 mg by mouth every evening.  5  . levonorgestrel (MIRENA) 20 MCG/24HR IUD 1 each by Intrauterine route once.    Marland Kitchen LORazepam (  ATIVAN) 1 MG tablet Take 1 mg by mouth every 8 (eight) hours as needed for anxiety. (takes 2 mg when having a medical procedure.)    . magnesium oxide (MAG-OX) 400 MG tablet Take 400 mg by mouth daily.    . meclizine (ANTIVERT) 25 MG tablet Take 25 mg by mouth 3 (three) times daily as needed for dizziness.    . Melatonin 10 MG CAPS Take 1 tablet by mouth at bedtime.     . methocarbamol (ROBAXIN) 500 MG tablet Take 500 mg by mouth 2 (two) times daily as needed.  2  . montelukast (SINGULAIR) 10 MG tablet Take 10 mg by mouth at bedtime.    . Multiple Vitamin (MULTIVITAMIN WITH MINERALS) TABS Take 1 tablet by mouth at bedtime.     . Omega-3 Fatty Acids (OMEGA 3 PO) Take 1 tablet by mouth at bedtime.    Marland Kitchen oxyCODONE-acetaminophen (PERCOCET/ROXICET) 5-325 MG per tablet Take 1-2 tablets by mouth daily as needed for moderate pain or severe pain (mighraine.).     Marland Kitchen polyethylene glycol (MIRALAX / GLYCOLAX) packet Take 17 g by mouth at bedtime.     Marland Kitchen PRESCRIPTION MEDICATION Inject 1 each into the skin 2 (two) times a week. Allergy shot for Cats.    . promethazine (PHENERGAN) 25 MG tablet Take 25 mg by mouth every 6 (six) hours as needed for nausea.     . risperiDONE (RISPERDAL) 0.25 MG tablet Take 0.25 mg by mouth. As needed for an anxiety attack    . senna (SENOKOT) 8.6 MG TABS Take 1 tablet by mouth every other day.     . topiramate (TOPAMAX) 100 MG tablet TK 1 T PO D  0  . valACYclovir (VALTREX) 500 MG tablet TK 2 TS PO TID FOR 7 DAYS  0  . VOLTAREN 1 % GEL   3   No current facility-administered medications for this visit.    SURGICAL HISTORY:  Past Surgical History  Procedure Laterality Date  . Hemorrhoid surgery  1993    x3  . Cesarean section  [id number]  . Shoulder surgery  2007/2008  . Spine surgery  2010    cervical  . Dilation and curettage of uterus    . Pelvic laparoscopy    . Laparoscopy N/A 10/16/2012    Procedure: LAPAROSCOPY DIAGNOSTIC;  Surgeon: Joyice Faster. Cornett, MD;  Location: [facility];  Service: General;  Laterality: N/A;  . Cholecystectomy N/A 10/16/2012    Procedure: LAPAROSCOPIC CHOLECYSTECTOMY;  Surgeon: Joyice Faster. Cornett, MD;  Location: [facility];  Service: General;  Laterality: N/A;  . Laparoscopic lysis of adhesions N/A 10/16/2012    Procedure: LAPAROSCOPIC LYSIS OF ADHESIONS;  Surgeon: Joyice Faster. Cornett, MD;  Location: [facility];  Service: General;  Laterality: N/A;  . Bone marrow biopsy Left 12/18/2013    Plasma cell dyscrasia 8% population of plasma cells  . Iud removal  02/2015    Mirena    REVIEW OF SYSTEMS:  A comprehensive review of systems was negative except for: Musculoskeletal: positive  for myalgias   PHYSICAL EXAMINATION: General appearance: alert, cooperative, fatigued and no distress Head: Normocephalic, without obvious abnormality, atraumatic Neck: no adenopathy, no JVD, supple, symmetrical, trachea midline and thyroid not enlarged, symmetric, no tenderness/mass/nodules Lymph nodes: Cervical, supraclavicular, and axillary nodes normal. Resp: clear to auscultation bilaterally Back: symmetric, no curvature. ROM normal. No CVA tenderness. Cardio: regular rate and rhythm, S1, S2 normal, no murmur, click, rub or gallop GI: soft, non-tender; bowel sounds normal; no masses,  no organomegaly Extremities: extremities normal, atraumatic, no cyanosis or edema Neurologic: Alert and oriented X 3, normal strength and tone. Normal symmetric reflexes. Normal coordination and gait  ECOG PERFORMANCE STATUS: 1 - Symptomatic but completely ambulatory  Blood pressure 118/56, pulse 80, temperature 98.6 F (37 C), temperature source Oral, resp. rate 18, height 5' 2"  (1.575 m), weight 123 lb 6.4 oz (55.974 kg), SpO2 100 %.  LABORATORY DATA: Lab Results  Component Value Date   WBC 7.0 07/12/2015   HGB 14.1 07/12/2015   HCT 41.5 07/12/2015   MCV 92.2 07/12/2015   PLT 248 07/12/2015      Chemistry      Component Value Date/Time   NA 144 07/12/2015 1521   NA 141 06/24/2014 1402   K 3.8 07/12/2015 1521   K 3.5 06/24/2014 1402   CL 106 06/24/2014 1402   CO2 27 07/12/2015 1521   CO2 25 06/24/2014 1402   BUN 10.3 07/12/2015 1521   BUN 10 06/24/2014 1402   CREATININE 0.8 07/12/2015 1521   CREATININE 0.55 06/24/2014 1402      Component Value Date/Time   CALCIUM 9.9 07/12/2015 1521   CALCIUM 9.8 06/24/2014 1402   ALKPHOS 54 07/12/2015 1521   ALKPHOS 57 10/11/2012 1309   AST 22 07/12/2015 1521   AST 32 10/11/2012 1309   ALT 31 07/12/2015 1521   ALT 35 10/11/2012 1309   BILITOT 0.40 07/12/2015 1521   BILITOT 0.5 10/11/2012 1309     Myeloma panel: Beta-2 microglobulin 1.3,  free kappa light chain 9.27, free lambda light chain 9.69 with a kappa/lambda ratio 0.96. IgG 554, IgA 236 and IgM 60.  RADIOGRAPHIC STUDIES: No results found.  ASSESSMENT AND PLAN: This is a very pleasant 55 years old white female with history of monoclonal gammopathy of undetermined significance in addition to other history of fibromyalgia, bipolar disorder, anxiety and depression as well as irritable bowel syndrome. The patient is doing fine today except for Myalgia.  Her myeloma panel today showed no concerning findings. I discussed the lab result with the patient today. I recommended for her to continue on observation with routine follow-up visit by her primary care physician and Dr. Estanislado Pandy. I will see the patient as needed basis at this point. She was advised to call immediately if she has any concerning symptoms in the interval. The patient voices understanding of current disease status and treatment options and is in agreement with the current care plan.  All questions were answered. The patient knows to call the clinic with any problems, questions or concerns. We can certainly see the patient much sooner if necessary.  Disclaimer: This note was dictated with voice recognition software. Similar sounding words can inadvertently be transcribed and may not be corrected upon review.

## 2015-07-23 DIAGNOSIS — J3089 Other allergic rhinitis: Secondary | ICD-10-CM | POA: Diagnosis not present

## 2015-07-23 DIAGNOSIS — F411 Generalized anxiety disorder: Secondary | ICD-10-CM | POA: Diagnosis not present

## 2015-07-23 DIAGNOSIS — K5289 Other specified noninfective gastroenteritis and colitis: Secondary | ICD-10-CM | POA: Diagnosis not present

## 2015-07-23 DIAGNOSIS — G4700 Insomnia, unspecified: Secondary | ICD-10-CM | POA: Diagnosis not present

## 2015-07-23 DIAGNOSIS — J3081 Allergic rhinitis due to animal (cat) (dog) hair and dander: Secondary | ICD-10-CM | POA: Diagnosis not present

## 2015-07-23 DIAGNOSIS — J301 Allergic rhinitis due to pollen: Secondary | ICD-10-CM | POA: Diagnosis not present

## 2015-07-23 DIAGNOSIS — D472 Monoclonal gammopathy: Secondary | ICD-10-CM | POA: Diagnosis not present

## 2015-07-23 DIAGNOSIS — M797 Fibromyalgia: Secondary | ICD-10-CM | POA: Diagnosis not present

## 2015-07-23 DIAGNOSIS — G629 Polyneuropathy, unspecified: Secondary | ICD-10-CM | POA: Diagnosis not present

## 2015-07-23 DIAGNOSIS — F3181 Bipolar II disorder: Secondary | ICD-10-CM | POA: Diagnosis not present

## 2015-07-23 DIAGNOSIS — G43909 Migraine, unspecified, not intractable, without status migrainosus: Secondary | ICD-10-CM | POA: Diagnosis not present

## 2015-07-23 DIAGNOSIS — Z79899 Other long term (current) drug therapy: Secondary | ICD-10-CM | POA: Diagnosis not present

## 2015-07-23 DIAGNOSIS — K589 Irritable bowel syndrome without diarrhea: Secondary | ICD-10-CM | POA: Diagnosis not present

## 2015-08-03 DIAGNOSIS — J3089 Other allergic rhinitis: Secondary | ICD-10-CM | POA: Diagnosis not present

## 2015-08-03 DIAGNOSIS — J301 Allergic rhinitis due to pollen: Secondary | ICD-10-CM | POA: Diagnosis not present

## 2015-08-03 DIAGNOSIS — J3081 Allergic rhinitis due to animal (cat) (dog) hair and dander: Secondary | ICD-10-CM | POA: Diagnosis not present

## 2015-08-09 DIAGNOSIS — F3113 Bipolar disorder, current episode manic without psychotic features, severe: Secondary | ICD-10-CM | POA: Diagnosis not present

## 2015-08-10 DIAGNOSIS — D692 Other nonthrombocytopenic purpura: Secondary | ICD-10-CM | POA: Diagnosis not present

## 2015-08-10 DIAGNOSIS — Z85828 Personal history of other malignant neoplasm of skin: Secondary | ICD-10-CM | POA: Diagnosis not present

## 2015-08-13 DIAGNOSIS — J3081 Allergic rhinitis due to animal (cat) (dog) hair and dander: Secondary | ICD-10-CM | POA: Diagnosis not present

## 2015-08-13 DIAGNOSIS — J3089 Other allergic rhinitis: Secondary | ICD-10-CM | POA: Diagnosis not present

## 2015-08-13 DIAGNOSIS — J301 Allergic rhinitis due to pollen: Secondary | ICD-10-CM | POA: Diagnosis not present

## 2015-08-23 DIAGNOSIS — J3081 Allergic rhinitis due to animal (cat) (dog) hair and dander: Secondary | ICD-10-CM | POA: Diagnosis not present

## 2015-08-23 DIAGNOSIS — J301 Allergic rhinitis due to pollen: Secondary | ICD-10-CM | POA: Diagnosis not present

## 2015-08-23 DIAGNOSIS — J3089 Other allergic rhinitis: Secondary | ICD-10-CM | POA: Diagnosis not present

## 2015-09-03 DIAGNOSIS — D472 Monoclonal gammopathy: Secondary | ICD-10-CM | POA: Diagnosis not present

## 2015-09-03 DIAGNOSIS — G629 Polyneuropathy, unspecified: Secondary | ICD-10-CM | POA: Diagnosis not present

## 2015-09-03 DIAGNOSIS — K5289 Other specified noninfective gastroenteritis and colitis: Secondary | ICD-10-CM | POA: Diagnosis not present

## 2015-09-03 DIAGNOSIS — M797 Fibromyalgia: Secondary | ICD-10-CM | POA: Diagnosis not present

## 2015-09-03 DIAGNOSIS — F3181 Bipolar II disorder: Secondary | ICD-10-CM | POA: Diagnosis not present

## 2015-09-03 DIAGNOSIS — G43909 Migraine, unspecified, not intractable, without status migrainosus: Secondary | ICD-10-CM | POA: Diagnosis not present

## 2015-09-03 DIAGNOSIS — F411 Generalized anxiety disorder: Secondary | ICD-10-CM | POA: Diagnosis not present

## 2015-09-03 DIAGNOSIS — M8580 Other specified disorders of bone density and structure, unspecified site: Secondary | ICD-10-CM | POA: Diagnosis not present

## 2015-09-03 DIAGNOSIS — K589 Irritable bowel syndrome without diarrhea: Secondary | ICD-10-CM | POA: Diagnosis not present

## 2015-09-03 DIAGNOSIS — G4700 Insomnia, unspecified: Secondary | ICD-10-CM | POA: Diagnosis not present

## 2015-09-08 DIAGNOSIS — G43019 Migraine without aura, intractable, without status migrainosus: Secondary | ICD-10-CM | POA: Diagnosis not present

## 2015-09-10 DIAGNOSIS — F3113 Bipolar disorder, current episode manic without psychotic features, severe: Secondary | ICD-10-CM | POA: Diagnosis not present

## 2015-09-16 DIAGNOSIS — H5310 Unspecified subjective visual disturbances: Secondary | ICD-10-CM | POA: Diagnosis not present

## 2015-09-23 DIAGNOSIS — J301 Allergic rhinitis due to pollen: Secondary | ICD-10-CM | POA: Diagnosis not present

## 2015-09-23 DIAGNOSIS — J3081 Allergic rhinitis due to animal (cat) (dog) hair and dander: Secondary | ICD-10-CM | POA: Diagnosis not present

## 2015-09-23 DIAGNOSIS — J3089 Other allergic rhinitis: Secondary | ICD-10-CM | POA: Diagnosis not present

## 2015-10-12 DIAGNOSIS — G629 Polyneuropathy, unspecified: Secondary | ICD-10-CM | POA: Diagnosis not present

## 2015-10-12 DIAGNOSIS — D472 Monoclonal gammopathy: Secondary | ICD-10-CM | POA: Diagnosis not present

## 2015-10-12 DIAGNOSIS — F3181 Bipolar II disorder: Secondary | ICD-10-CM | POA: Diagnosis not present

## 2015-10-12 DIAGNOSIS — M8580 Other specified disorders of bone density and structure, unspecified site: Secondary | ICD-10-CM | POA: Diagnosis not present

## 2015-10-12 DIAGNOSIS — G43909 Migraine, unspecified, not intractable, without status migrainosus: Secondary | ICD-10-CM | POA: Diagnosis not present

## 2015-10-12 DIAGNOSIS — R5383 Other fatigue: Secondary | ICD-10-CM | POA: Diagnosis not present

## 2015-10-12 DIAGNOSIS — K589 Irritable bowel syndrome without diarrhea: Secondary | ICD-10-CM | POA: Diagnosis not present

## 2015-10-12 DIAGNOSIS — F411 Generalized anxiety disorder: Secondary | ICD-10-CM | POA: Diagnosis not present

## 2015-10-12 DIAGNOSIS — G4700 Insomnia, unspecified: Secondary | ICD-10-CM | POA: Diagnosis not present

## 2015-10-12 DIAGNOSIS — E7800 Pure hypercholesterolemia, unspecified: Secondary | ICD-10-CM | POA: Diagnosis not present

## 2015-10-12 DIAGNOSIS — K5289 Other specified noninfective gastroenteritis and colitis: Secondary | ICD-10-CM | POA: Diagnosis not present

## 2015-10-12 DIAGNOSIS — M797 Fibromyalgia: Secondary | ICD-10-CM | POA: Diagnosis not present

## 2015-10-22 DIAGNOSIS — J301 Allergic rhinitis due to pollen: Secondary | ICD-10-CM | POA: Diagnosis not present

## 2015-10-22 DIAGNOSIS — J3081 Allergic rhinitis due to animal (cat) (dog) hair and dander: Secondary | ICD-10-CM | POA: Diagnosis not present

## 2015-10-22 DIAGNOSIS — J3089 Other allergic rhinitis: Secondary | ICD-10-CM | POA: Diagnosis not present

## 2015-10-25 DIAGNOSIS — F3113 Bipolar disorder, current episode manic without psychotic features, severe: Secondary | ICD-10-CM | POA: Diagnosis not present

## 2015-10-26 DIAGNOSIS — L821 Other seborrheic keratosis: Secondary | ICD-10-CM | POA: Diagnosis not present

## 2015-10-26 DIAGNOSIS — J3081 Allergic rhinitis due to animal (cat) (dog) hair and dander: Secondary | ICD-10-CM | POA: Diagnosis not present

## 2015-10-26 DIAGNOSIS — L814 Other melanin hyperpigmentation: Secondary | ICD-10-CM | POA: Diagnosis not present

## 2015-10-26 DIAGNOSIS — D2272 Melanocytic nevi of left lower limb, including hip: Secondary | ICD-10-CM | POA: Diagnosis not present

## 2015-10-26 DIAGNOSIS — L820 Inflamed seborrheic keratosis: Secondary | ICD-10-CM | POA: Diagnosis not present

## 2015-10-26 DIAGNOSIS — D1801 Hemangioma of skin and subcutaneous tissue: Secondary | ICD-10-CM | POA: Diagnosis not present

## 2015-10-26 DIAGNOSIS — J301 Allergic rhinitis due to pollen: Secondary | ICD-10-CM | POA: Diagnosis not present

## 2015-10-26 DIAGNOSIS — Z85828 Personal history of other malignant neoplasm of skin: Secondary | ICD-10-CM | POA: Diagnosis not present

## 2015-10-26 DIAGNOSIS — J3089 Other allergic rhinitis: Secondary | ICD-10-CM | POA: Diagnosis not present

## 2015-10-26 DIAGNOSIS — L918 Other hypertrophic disorders of the skin: Secondary | ICD-10-CM | POA: Diagnosis not present

## 2015-10-26 DIAGNOSIS — D2271 Melanocytic nevi of right lower limb, including hip: Secondary | ICD-10-CM | POA: Diagnosis not present

## 2015-10-26 DIAGNOSIS — L718 Other rosacea: Secondary | ICD-10-CM | POA: Diagnosis not present

## 2015-10-26 DIAGNOSIS — D225 Melanocytic nevi of trunk: Secondary | ICD-10-CM | POA: Diagnosis not present

## 2015-11-02 DIAGNOSIS — J3081 Allergic rhinitis due to animal (cat) (dog) hair and dander: Secondary | ICD-10-CM | POA: Diagnosis not present

## 2015-11-02 DIAGNOSIS — J3089 Other allergic rhinitis: Secondary | ICD-10-CM | POA: Diagnosis not present

## 2015-11-02 DIAGNOSIS — R51 Headache: Secondary | ICD-10-CM | POA: Diagnosis not present

## 2015-11-02 DIAGNOSIS — J301 Allergic rhinitis due to pollen: Secondary | ICD-10-CM | POA: Diagnosis not present

## 2015-11-12 DIAGNOSIS — J3081 Allergic rhinitis due to animal (cat) (dog) hair and dander: Secondary | ICD-10-CM | POA: Diagnosis not present

## 2015-11-12 DIAGNOSIS — J3089 Other allergic rhinitis: Secondary | ICD-10-CM | POA: Diagnosis not present

## 2015-11-12 DIAGNOSIS — J301 Allergic rhinitis due to pollen: Secondary | ICD-10-CM | POA: Diagnosis not present

## 2015-11-18 DIAGNOSIS — J3089 Other allergic rhinitis: Secondary | ICD-10-CM | POA: Diagnosis not present

## 2015-11-18 DIAGNOSIS — J3081 Allergic rhinitis due to animal (cat) (dog) hair and dander: Secondary | ICD-10-CM | POA: Diagnosis not present

## 2015-11-18 DIAGNOSIS — J301 Allergic rhinitis due to pollen: Secondary | ICD-10-CM | POA: Diagnosis not present

## 2015-11-23 DIAGNOSIS — F3181 Bipolar II disorder: Secondary | ICD-10-CM | POA: Diagnosis not present

## 2015-11-23 DIAGNOSIS — G43909 Migraine, unspecified, not intractable, without status migrainosus: Secondary | ICD-10-CM | POA: Diagnosis not present

## 2015-11-23 DIAGNOSIS — D472 Monoclonal gammopathy: Secondary | ICD-10-CM | POA: Diagnosis not present

## 2015-11-23 DIAGNOSIS — K589 Irritable bowel syndrome without diarrhea: Secondary | ICD-10-CM | POA: Diagnosis not present

## 2015-11-23 DIAGNOSIS — G4700 Insomnia, unspecified: Secondary | ICD-10-CM | POA: Diagnosis not present

## 2015-11-23 DIAGNOSIS — K5289 Other specified noninfective gastroenteritis and colitis: Secondary | ICD-10-CM | POA: Diagnosis not present

## 2015-11-23 DIAGNOSIS — G629 Polyneuropathy, unspecified: Secondary | ICD-10-CM | POA: Diagnosis not present

## 2015-11-23 DIAGNOSIS — F411 Generalized anxiety disorder: Secondary | ICD-10-CM | POA: Diagnosis not present

## 2015-11-23 DIAGNOSIS — M8580 Other specified disorders of bone density and structure, unspecified site: Secondary | ICD-10-CM | POA: Diagnosis not present

## 2015-11-23 DIAGNOSIS — M797 Fibromyalgia: Secondary | ICD-10-CM | POA: Diagnosis not present

## 2015-11-23 DIAGNOSIS — E7800 Pure hypercholesterolemia, unspecified: Secondary | ICD-10-CM | POA: Diagnosis not present

## 2015-11-24 ENCOUNTER — Other Ambulatory Visit (HOSPITAL_COMMUNITY): Payer: Self-pay | Admitting: Family Medicine

## 2015-11-24 DIAGNOSIS — G43009 Migraine without aura, not intractable, without status migrainosus: Secondary | ICD-10-CM

## 2015-11-30 ENCOUNTER — Ambulatory Visit (HOSPITAL_COMMUNITY): Payer: Medicare Other

## 2015-12-03 DIAGNOSIS — J301 Allergic rhinitis due to pollen: Secondary | ICD-10-CM | POA: Diagnosis not present

## 2015-12-03 DIAGNOSIS — J3089 Other allergic rhinitis: Secondary | ICD-10-CM | POA: Diagnosis not present

## 2015-12-07 ENCOUNTER — Encounter (HOSPITAL_COMMUNITY): Payer: Self-pay

## 2015-12-07 ENCOUNTER — Inpatient Hospital Stay (HOSPITAL_COMMUNITY): Admission: RE | Admit: 2015-12-07 | Payer: Medicare Other | Source: Ambulatory Visit

## 2015-12-07 ENCOUNTER — Ambulatory Visit: Payer: Medicare Other | Admitting: Rheumatology

## 2015-12-08 DIAGNOSIS — J301 Allergic rhinitis due to pollen: Secondary | ICD-10-CM | POA: Diagnosis not present

## 2015-12-08 DIAGNOSIS — J3081 Allergic rhinitis due to animal (cat) (dog) hair and dander: Secondary | ICD-10-CM | POA: Diagnosis not present

## 2015-12-08 DIAGNOSIS — J3089 Other allergic rhinitis: Secondary | ICD-10-CM | POA: Diagnosis not present

## 2015-12-09 ENCOUNTER — Ambulatory Visit (HOSPITAL_COMMUNITY): Payer: Medicare Other

## 2015-12-09 SURGERY — Surgical Case
Anesthesia: *Unknown

## 2015-12-14 ENCOUNTER — Other Ambulatory Visit (HOSPITAL_COMMUNITY): Payer: Self-pay | Admitting: *Deleted

## 2015-12-14 NOTE — Pre-Procedure Instructions (Signed)
    Jyasia Vanderstelt Kelemen  12/14/2015    Your procedure is scheduled on Thursday, December 16, 2015 at 10:00 AM.   Report to [facility] Entrance "A" Admitting Office at 8:00 AM.   Call this number if you have problems the morning of surgery: [phone]    Remember:  Do not eat food or drink liquids after midnight tonight.  Take these medicines the morning of surgery with A SIP OF WATER: Dexlansoprazole (Dexilant), Topiramate (Topamax), Oxycodone - if needed, Lorazepam (Ativan) - if needed, Flonase, Albuterol nebulizer - if needed, Albuterol inhaler - if needed (Please bring this inhaler with you in the morning.)   Do not wear jewelry, make-up or nail polish.  Do not wear lotions, powders, or perfumes.  Do not shave 48 hours prior to surgery.    Do not bring valuables to the hospital.  [facility] is not responsible for any belongings or valuables.  Contacts, dentures or bridgework may not be worn into surgery.  Leave your suitcase in the car.  After surgery it may be brought to your room.  For patients admitted to the hospital, discharge time will be determined by your treatment team.  Patients discharged the day of surgery will not be allowed to drive home.   Please read over the following fact sheets that you were given. Coughing and Deep Breathing

## 2015-12-15 ENCOUNTER — Encounter (HOSPITAL_COMMUNITY)
Admission: RE | Admit: 2015-12-15 | Discharge: 2015-12-15 | Disposition: A | Discharge status: Home / Self Care | Payer: Medicare Other | Source: Ambulatory Visit | Attending: Family Medicine | Admitting: Family Medicine

## 2015-12-15 ENCOUNTER — Encounter (HOSPITAL_COMMUNITY): Payer: Self-pay | Admitting: *Deleted

## 2015-12-15 DIAGNOSIS — M8580 Other specified disorders of bone density and structure, unspecified site: Secondary | ICD-10-CM | POA: Diagnosis not present

## 2015-12-15 DIAGNOSIS — G43909 Migraine, unspecified, not intractable, without status migrainosus: Secondary | ICD-10-CM | POA: Diagnosis not present

## 2015-12-15 DIAGNOSIS — F3181 Bipolar II disorder: Secondary | ICD-10-CM | POA: Diagnosis not present

## 2015-12-15 DIAGNOSIS — E7800 Pure hypercholesterolemia, unspecified: Secondary | ICD-10-CM | POA: Diagnosis not present

## 2015-12-15 DIAGNOSIS — G629 Polyneuropathy, unspecified: Secondary | ICD-10-CM | POA: Diagnosis not present

## 2015-12-15 DIAGNOSIS — K5289 Other specified noninfective gastroenteritis and colitis: Secondary | ICD-10-CM | POA: Diagnosis not present

## 2015-12-15 DIAGNOSIS — F411 Generalized anxiety disorder: Secondary | ICD-10-CM | POA: Diagnosis not present

## 2015-12-15 DIAGNOSIS — J301 Allergic rhinitis due to pollen: Secondary | ICD-10-CM | POA: Diagnosis not present

## 2015-12-15 DIAGNOSIS — F319 Bipolar disorder, unspecified: Secondary | ICD-10-CM | POA: Diagnosis not present

## 2015-12-15 DIAGNOSIS — D472 Monoclonal gammopathy: Secondary | ICD-10-CM | POA: Diagnosis not present

## 2015-12-15 DIAGNOSIS — G43919 Migraine, unspecified, intractable, without status migrainosus: Secondary | ICD-10-CM | POA: Diagnosis not present

## 2015-12-15 DIAGNOSIS — M797 Fibromyalgia: Secondary | ICD-10-CM | POA: Diagnosis not present

## 2015-12-15 DIAGNOSIS — F418 Other specified anxiety disorders: Secondary | ICD-10-CM | POA: Diagnosis not present

## 2015-12-15 DIAGNOSIS — K589 Irritable bowel syndrome without diarrhea: Secondary | ICD-10-CM | POA: Diagnosis not present

## 2015-12-15 DIAGNOSIS — J45909 Unspecified asthma, uncomplicated: Secondary | ICD-10-CM | POA: Diagnosis not present

## 2015-12-15 DIAGNOSIS — G4700 Insomnia, unspecified: Secondary | ICD-10-CM | POA: Diagnosis not present

## 2015-12-15 DIAGNOSIS — J3089 Other allergic rhinitis: Secondary | ICD-10-CM | POA: Diagnosis not present

## 2015-12-15 DIAGNOSIS — Z87891 Personal history of nicotine dependence: Secondary | ICD-10-CM | POA: Diagnosis not present

## 2015-12-15 DIAGNOSIS — J3081 Allergic rhinitis due to animal (cat) (dog) hair and dander: Secondary | ICD-10-CM | POA: Diagnosis not present

## 2015-12-15 HISTORY — DX: Malignant (primary) neoplasm, unspecified: C80.1

## 2015-12-15 HISTORY — DX: Major depressive disorder, single episode, unspecified: F32.9

## 2015-12-15 HISTORY — DX: Depression, unspecified: F32.A

## 2015-12-15 HISTORY — DX: Cardiac murmur, unspecified: R01.1

## 2015-12-15 HISTORY — DX: Unspecified osteoarthritis, unspecified site: M19.90

## 2015-12-15 HISTORY — DX: Other complications of anesthesia, initial encounter: T88.59XA

## 2015-12-15 HISTORY — DX: Adverse effect of unspecified anesthetic, initial encounter: T41.45XA

## 2015-12-15 HISTORY — DX: Unspecified hemorrhoids: K64.9

## 2015-12-15 HISTORY — DX: Unspecified asthma, uncomplicated: J45.909

## 2015-12-15 LAB — BASIC METABOLIC PANEL
Anion gap: 8 (ref 5–15)
BUN: 15 mg/dL (ref 6–20)
CO2: 23 mmol/L (ref 22–32)
Calcium: 9.7 mg/dL (ref 8.9–10.3)
Chloride: 110 mmol/L (ref 101–111)
Creatinine, Ser: 0.73 mg/dL (ref 0.44–1.00)
GFR calc Af Amer: >60 mL/min (ref >60)
GFR calc non Af Amer: >60 mL/min (ref >60)
Glucose, Bld: 105 mg/dL — ABNORMAL HIGH (ref 65–99)
Potassium: 3.8 mmol/L (ref 3.5–5.1)
Sodium: 141 mmol/L (ref 135–145)

## 2015-12-15 LAB — CBC
HCT: 42.6 % (ref 36.0–46.0)
Hemoglobin: 14.7 g/dL (ref 12.0–15.0)
MCH: 30.4 pg (ref 26.0–34.0)
MCHC: 34.5 g/dL (ref 30.0–36.0)
MCV: 88 fL (ref 78.0–100.0)
Platelets: 252 10*3/uL (ref 150–400)
RBC: 4.84 MIL/uL (ref 3.87–5.11)
RDW: 12.3 % (ref 11.5–15.5)
WBC: 6.8 10*3/uL (ref 4.0–10.5)

## 2015-12-15 NOTE — Progress Notes (Signed)
Pt denies cardiac history, chest pain or sob. 

## 2015-12-16 ENCOUNTER — Ambulatory Visit (HOSPITAL_COMMUNITY): Payer: Medicare Other | Admitting: Anesthesiology

## 2015-12-16 ENCOUNTER — Encounter (HOSPITAL_COMMUNITY): Payer: Self-pay | Admitting: *Deleted

## 2015-12-16 ENCOUNTER — Encounter (HOSPITAL_COMMUNITY): Admission: RE | Disposition: A | Discharge status: Home / Self Care | Payer: Self-pay | Source: Ambulatory Visit

## 2015-12-16 ENCOUNTER — Ambulatory Visit (HOSPITAL_COMMUNITY)
Admission: RE | Admit: 2015-12-16 | Discharge: 2015-12-16 | Disposition: A | Discharge status: Home / Self Care | Payer: Medicare Other | Source: Ambulatory Visit | Attending: Family Medicine | Admitting: Family Medicine

## 2015-12-16 ENCOUNTER — Ambulatory Visit (HOSPITAL_COMMUNITY): Admission: RE | Admit: 2015-12-16 | Payer: Medicare Other | Source: Ambulatory Visit

## 2015-12-16 DIAGNOSIS — G43919 Migraine, unspecified, intractable, without status migrainosus: Secondary | ICD-10-CM | POA: Diagnosis not present

## 2015-12-16 DIAGNOSIS — F418 Other specified anxiety disorders: Secondary | ICD-10-CM | POA: Diagnosis not present

## 2015-12-16 DIAGNOSIS — G43009 Migraine without aura, not intractable, without status migrainosus: Secondary | ICD-10-CM

## 2015-12-16 DIAGNOSIS — G43909 Migraine, unspecified, not intractable, without status migrainosus: Secondary | ICD-10-CM | POA: Diagnosis not present

## 2015-12-16 DIAGNOSIS — J45909 Unspecified asthma, uncomplicated: Secondary | ICD-10-CM | POA: Diagnosis not present

## 2015-12-16 DIAGNOSIS — F319 Bipolar disorder, unspecified: Secondary | ICD-10-CM | POA: Insufficient documentation

## 2015-12-16 DIAGNOSIS — G43119 Migraine with aura, intractable, without status migrainosus: Secondary | ICD-10-CM | POA: Diagnosis not present

## 2015-12-16 DIAGNOSIS — Z87891 Personal history of nicotine dependence: Secondary | ICD-10-CM | POA: Insufficient documentation

## 2015-12-16 DIAGNOSIS — D649 Anemia, unspecified: Secondary | ICD-10-CM | POA: Diagnosis not present

## 2015-12-16 DIAGNOSIS — K219 Gastro-esophageal reflux disease without esophagitis: Secondary | ICD-10-CM | POA: Diagnosis not present

## 2015-12-16 DIAGNOSIS — M797 Fibromyalgia: Secondary | ICD-10-CM | POA: Diagnosis not present

## 2015-12-16 HISTORY — PX: RADIOLOGY WITH ANESTHESIA: SHX6223

## 2015-12-16 SURGERY — RADIOLOGY WITH ANESTHESIA
Anesthesia: General

## 2015-12-16 MED ORDER — GADOBENATE DIMEGLUMINE 529 MG/ML IV SOLN
15.0000 mL | Freq: Once | INTRAVENOUS | Status: AC
  Administered 2015-12-16: 12 mL via INTRAVENOUS

## 2015-12-16 MED ORDER — LACTATED RINGERS IV SOLN
INTRAVENOUS | Status: DC
  Administered 2015-12-16 (×3): via INTRAVENOUS

## 2015-12-16 NOTE — Anesthesia Procedure Notes (Signed)
Procedure Name: LMA Insertion Date/Time: 12/16/2015 10:12 AM Performed by: Izora Gala Pre-anesthesia Checklist: Patient identified, Emergency Drugs available and Suction available Patient Re-evaluated:Patient Re-evaluated prior to inductionOxygen Delivery Method: Circle system utilized Preoxygenation: Pre-oxygenation with 100% oxygen Intubation Type: IV induction Ventilation: Mask ventilation without difficulty LMA: LMA inserted LMA Size: 4.0 Number of attempts: 1 Placement Confirmation: ETT inserted through vocal cords under direct vision,  positive ETCO2 and breath sounds checked- equal and bilateral Tube secured with: Tape Dental Injury: Teeth and Oropharynx as per pre-operative assessment

## 2015-12-16 NOTE — Transfer of Care (Signed)
Immediate Anesthesia Transfer of Care Note  Patient: Danielle Bruce  Procedure(s) Performed: Procedure(s): MRI OF BRAIN WITH AND WITHOUT CONTRAST (N/A)  Patient Location: PACU  Anesthesia Type:General  Level of Consciousness: awake, alert , sedated and patient cooperative  Airway & Oxygen Therapy: Patient Spontanous Breathing and Patient connected to nasal cannula oxygen  Post-op Assessment: Report given to RN, Post -op Vital signs reviewed and stable, Patient moving all extremities and Patient moving all extremities X 4  Post vital signs: Reviewed and stable  Last Vitals:  Vitals:   12/16/15 0825 12/16/15 1145  BP: (!) 106/41   Pulse: 78   Resp: 16   Temp: 37.1 C 36.5 C    Last Pain:  Vitals:   12/16/15 0825  TempSrc: Oral      Patients Stated Pain Goal: 6 ([id number])  Complications: No apparent anesthesia complications

## 2015-12-16 NOTE — Anesthesia Preprocedure Evaluation (Signed)
Anesthesia Evaluation  Patient identified by MRN, date of birth, ID band Patient awake    Reviewed: Allergy & Precautions, NPO status , Patient's Chart, lab work & pertinent test results  Airway Mallampati: I  TM Distance: >3 FB Neck ROM: Full    Dental   Pulmonary asthma , former smoker,    Pulmonary exam normal        Cardiovascular Normal cardiovascular exam     Neuro/Psych Anxiety Depression Bipolar Disorder    GI/Hepatic   Endo/Other    Renal/GU      Musculoskeletal   Abdominal   Peds  Hematology   Anesthesia Other Findings   Reproductive/Obstetrics                             Anesthesia Physical Anesthesia Plan  ASA: III  Anesthesia Plan: General   Post-op Pain Management:    Induction: Intravenous  Airway Management Planned: LMA  Additional Equipment:   Intra-op Plan:   Post-operative Plan: Extubation in OR  Informed Consent: I have reviewed the patients History and Physical, chart, labs and discussed the procedure including the risks, benefits and alternatives for the proposed anesthesia with the patient or authorized representative who has indicated his/her understanding and acceptance.     Plan Discussed with: CRNA and Surgeon  Anesthesia Plan Comments:         Anesthesia Quick Evaluation

## 2015-12-17 ENCOUNTER — Encounter (HOSPITAL_COMMUNITY): Payer: Self-pay | Admitting: Radiology

## 2015-12-17 MED FILL — Midazolam HCl Inj 2 MG/2ML (Base Equivalent): INTRAMUSCULAR | Qty: 2 | Status: AC

## 2015-12-17 MED FILL — Propofol IV Emul 200 MG/20ML (10 MG/ML): INTRAVENOUS | Qty: 20 | Status: AC

## 2015-12-17 MED FILL — Ondansetron HCl Inj 4 MG/2ML (2 MG/ML): INTRAMUSCULAR | Qty: 2 | Status: AC

## 2015-12-17 MED FILL — Lidocaine HCl Local Soln Prefilled Syringe 100 MG/5ML (2%): INTRAMUSCULAR | Qty: 5 | Status: AC

## 2015-12-17 MED FILL — Phenylephrine-Dextrose IV Solution 8 MG/100ML-5%: INTRAVENOUS | Qty: 100 | Status: AC

## 2015-12-17 MED FILL — Fentanyl Citrate Preservative Free (PF) Inj 100 MCG/2ML: INTRAMUSCULAR | Qty: 2 | Status: AC

## 2015-12-22 DIAGNOSIS — F3113 Bipolar disorder, current episode manic without psychotic features, severe: Secondary | ICD-10-CM | POA: Diagnosis not present

## 2015-12-23 DIAGNOSIS — J3089 Other allergic rhinitis: Secondary | ICD-10-CM | POA: Diagnosis not present

## 2015-12-23 DIAGNOSIS — J3081 Allergic rhinitis due to animal (cat) (dog) hair and dander: Secondary | ICD-10-CM | POA: Diagnosis not present

## 2015-12-23 DIAGNOSIS — J301 Allergic rhinitis due to pollen: Secondary | ICD-10-CM | POA: Diagnosis not present

## 2015-12-28 DIAGNOSIS — J301 Allergic rhinitis due to pollen: Secondary | ICD-10-CM | POA: Diagnosis not present

## 2015-12-28 DIAGNOSIS — J3089 Other allergic rhinitis: Secondary | ICD-10-CM | POA: Diagnosis not present

## 2015-12-28 DIAGNOSIS — J3081 Allergic rhinitis due to animal (cat) (dog) hair and dander: Secondary | ICD-10-CM | POA: Diagnosis not present

## 2015-12-31 DIAGNOSIS — J4530 Mild persistent asthma, uncomplicated: Secondary | ICD-10-CM | POA: Diagnosis not present

## 2015-12-31 DIAGNOSIS — J301 Allergic rhinitis due to pollen: Secondary | ICD-10-CM | POA: Diagnosis not present

## 2015-12-31 DIAGNOSIS — J3089 Other allergic rhinitis: Secondary | ICD-10-CM | POA: Diagnosis not present

## 2015-12-31 DIAGNOSIS — J3081 Allergic rhinitis due to animal (cat) (dog) hair and dander: Secondary | ICD-10-CM | POA: Diagnosis not present

## 2016-01-04 DIAGNOSIS — R0681 Apnea, not elsewhere classified: Secondary | ICD-10-CM | POA: Diagnosis not present

## 2016-01-04 DIAGNOSIS — R400 Somnolence: Secondary | ICD-10-CM | POA: Diagnosis not present

## 2016-01-06 DIAGNOSIS — G4700 Insomnia, unspecified: Secondary | ICD-10-CM | POA: Diagnosis not present

## 2016-01-06 DIAGNOSIS — G43909 Migraine, unspecified, not intractable, without status migrainosus: Secondary | ICD-10-CM | POA: Diagnosis not present

## 2016-01-06 DIAGNOSIS — G629 Polyneuropathy, unspecified: Secondary | ICD-10-CM | POA: Diagnosis not present

## 2016-01-06 DIAGNOSIS — K5289 Other specified noninfective gastroenteritis and colitis: Secondary | ICD-10-CM | POA: Diagnosis not present

## 2016-01-06 DIAGNOSIS — E7800 Pure hypercholesterolemia, unspecified: Secondary | ICD-10-CM | POA: Diagnosis not present

## 2016-01-06 DIAGNOSIS — F3181 Bipolar II disorder: Secondary | ICD-10-CM | POA: Diagnosis not present

## 2016-01-06 DIAGNOSIS — M8580 Other specified disorders of bone density and structure, unspecified site: Secondary | ICD-10-CM | POA: Diagnosis not present

## 2016-01-06 DIAGNOSIS — D472 Monoclonal gammopathy: Secondary | ICD-10-CM | POA: Diagnosis not present

## 2016-01-06 DIAGNOSIS — K589 Irritable bowel syndrome without diarrhea: Secondary | ICD-10-CM | POA: Diagnosis not present

## 2016-01-06 DIAGNOSIS — F411 Generalized anxiety disorder: Secondary | ICD-10-CM | POA: Diagnosis not present

## 2016-01-06 DIAGNOSIS — Z0000 Encounter for general adult medical examination without abnormal findings: Secondary | ICD-10-CM | POA: Diagnosis not present

## 2016-01-06 DIAGNOSIS — M797 Fibromyalgia: Secondary | ICD-10-CM | POA: Diagnosis not present

## 2016-01-11 DIAGNOSIS — J3089 Other allergic rhinitis: Secondary | ICD-10-CM | POA: Diagnosis not present

## 2016-01-11 DIAGNOSIS — J301 Allergic rhinitis due to pollen: Secondary | ICD-10-CM | POA: Diagnosis not present

## 2016-01-11 DIAGNOSIS — J3081 Allergic rhinitis due to animal (cat) (dog) hair and dander: Secondary | ICD-10-CM | POA: Diagnosis not present

## 2016-01-13 DIAGNOSIS — M797 Fibromyalgia: Secondary | ICD-10-CM | POA: Insufficient documentation

## 2016-01-13 NOTE — Progress Notes (Signed)
Office Visit Note  Patient: Danielle Bruce             Date of Birth: [birth year]           MRN: [id number]             PCP: Mayra Neer, MD Referring: Mayra Neer, MD Visit Date: 01/18/2016 Occupation: @GUAROCC @    Subjective:  Follow-up (6 MONTH ROV) Fibromyalgia syndrome, insomnia, fatigue.  History of Present Illness: Danielle Bruce is a 55 y.o. female  Last seen 06/01/2015 Ever since then she had microscopic lymphocytic colitis flare which was then treated by her doctor with prednisone taper.  The initial 6 weeks of the prednisone was at 20 mg and then she tapered gradually over time.  During the use of the prednisone her fibromyalgia responded well and did really really well. However after she started tapering she started having flare of her fibromyalgia.  She ended up using hydrocodone at one time secondary to her severe fibromyalgia pain.   Activities of Daily Living:  Patient reports morning stiffness for 15 minutes.   Patient Reports nocturnal pain.  Difficulty dressing/grooming: Reports Difficulty climbing stairs: Reports Difficulty getting out of chair: Reports Difficulty using hands for taps, buttons, cutlery, and/or writing: Reports   Review of Systems  Constitutional: Positive for activity change and fatigue.  HENT: Negative for mouth sores and mouth dryness.   Eyes: Negative for dryness.  Respiratory: Negative for shortness of breath.   Gastrointestinal: Negative for constipation and diarrhea.  Musculoskeletal: Positive for myalgias and myalgias.  Skin: Negative for sensitivity to sunlight.  Psychiatric/Behavioral: Positive for sleep disturbance. Negative for decreased concentration.    PMFS History:  Patient Active Problem List   Diagnosis Date Noted  . Fibromyalgia 01/13/2016  . MGUS (monoclonal gammopathy of unknown significance) 12/23/2013  . Chronic cholecystitis without calculus 10/18/2012  . Abdominal pain, unspecified site 10/18/2012    . Nausea alone 10/18/2012  . Unspecified constipation 10/18/2012  . Depression 10/18/2012  . Bipolar 2 disorder ([place]) 10/18/2012  . Anxiety   . ASCUS (atypical squamous cells of undetermined significance) on Pap smear   . IUD   . Hemorrhoids 11/29/2010  . Abdominal pain, left upper quadrant 11/29/2010    Past Medical History:  Diagnosis Date  . Anemia   . Anxiety   . Anxiety   . Arthritis    osteoarthritis  . ASCUS (atypical squamous cells of undetermined significance) on Pap smear 05/06/05   NEG HIGH RISK HPV--C&B BIOPSY BENIGN 12/2005  . Asthma   . Bipolar 2 disorder ([place])   . Cancer ([place])    skin cancer - basal cell  . Colon polyps   . Complication of anesthesia    anxious afterwards, will get headaches   . Constipation   . Depression   . Fibromyalgia   . Fibromyalgia 10/2013  . GERD (gastroesophageal reflux disease)   . 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 Heart murmur    never had any problems  . Hemorrhoids   . High cholesterol   . 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   cytology negative  . IBS (irritable bowel syndrome)   . Insomnia   . Lymphocytic colitis   . 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 Migraine   . Migraines   . Osteoarthritis   . Osteopenia   . Peripheral neuropathy ([place])   . PTSD (post-traumatic stress disorder)  Family History  Problem Relation Age of Onset  . Diabetes Father   . Hypertension Father   . Hyperlipidemia Father   . Heart disease Maternal Grandfather   . Heart disease Paternal Grandmother   . Heart disease Paternal Grandfather    Past Surgical History:  Procedure Laterality Date  . BONE MARROW BIOPSY Left 12/18/2013   Plasma cell dyscrasia 8% population of plasma cells  . CESAREAN SECTION  [phone]  . CHOLECYSTECTOMY N/A 10/16/2012   Procedure: LAPAROSCOPIC CHOLECYSTECTOMY;  Surgeon: Joyice Faster. Cornett, MD;  Location: [facility];  Service: General;   Laterality: N/A;  . COLONOSCOPY     numerous times  . DILATION AND CURETTAGE OF UTERUS    . ESOPHAGOGASTRODUODENOSCOPY    . Qulin   x3  . IUD REMOVAL  02/2015   Mirena  . LAPAROSCOPIC LYSIS OF ADHESIONS N/A 10/16/2012   Procedure: LAPAROSCOPIC LYSIS OF ADHESIONS;  Surgeon: Joyice Faster. Cornett, MD;  Location: [facility];  Service: General;  Laterality: N/A;  . LAPAROSCOPY N/A 10/16/2012   Procedure: LAPAROSCOPY DIAGNOSTIC;  Surgeon: Joyice Faster. Cornett, MD;  Location: [facility];  Service: General;  Laterality: N/A;  . PELVIC LAPAROSCOPY    . RADIOLOGY WITH ANESTHESIA N/A 12/16/2015   Procedure: MRI OF BRAIN WITH AND WITHOUT CONTRAST;  Surgeon: Medication Radiologist, MD;  Location: [facility];  Service: Radiology;  Laterality: N/A;  . SHOULDER SURGERY  2007/2008  . SPINE SURGERY  2010   cervical   Social History   Social History Narrative  . No narrative on file     Objective: Vital Signs: BP 123/66   Pulse 67   Resp 13   Ht 5' 2"  (1.575 m)   Wt 126 lb (57.2 kg)   LMP 12/28/2010   BMI 23.05 kg/m    Physical Exam  Constitutional: She is oriented to person, place, and time. She appears well-developed and well-nourished.  HENT:  Head: Normocephalic and atraumatic.  Eyes: EOM are normal. Pupils are equal, round, and reactive to light.  Cardiovascular: Normal rate, regular rhythm and normal heart sounds.  Exam reveals no gallop and no friction rub.   No murmur heard. Pulmonary/Chest: Effort normal and breath sounds normal. She has no wheezes. She has no rales.  Abdominal: Soft. Bowel sounds are normal. She exhibits no distension. There is no tenderness. There is no guarding. No hernia.  Musculoskeletal: Normal range of motion. She exhibits no edema, tenderness or deformity.  Lymphadenopathy:    She has no cervical adenopathy.  Neurological: She is alert and oriented to person, place, and time. Coordination normal.  Skin: Skin is warm and dry. Capillary refill takes less  than 2 seconds. No rash noted.  Psychiatric: She has a normal mood and affect. Her behavior is normal.     Musculoskeletal Exam:  Full range of motion of all joints Grip strength is equal and strong bilaterally Fibromyalgia tender points are 18 out of 18 positive  CDAI Exam: CDAI Homunculus Exam:   Joint Counts:  CDAI Tender Joint count: 0 CDAI Swollen Joint count: 0  Global Assessments:  Patient Global Assessment: 5 Provider Global Assessment: 5  CDAI Calculated Score: 10 No synovitis on examination   Investigation: No additional findings.   Imaging: No results found.  Speciality Comments: No specialty comments available.    Procedures:  Large Joint Inj Date/Time: 01/18/2016 3:35 PM Performed by: Eliezer Lofts Authorized by: Eliezer Lofts   Consent Given by:  Patient Site marked: the procedure site  was marked   Timeout: prior to procedure the correct patient, procedure, and site was verified   Indications:  Pain Location:  Sacroiliac Site:  L sacroiliac joint Prep: patient was prepped and draped in usual sterile fashion   Needle Size:  27 G Needle Length:  1.5 inches Approach:  Superior Ultrasound Guidance: No   Fluoroscopic Guidance: No   Arthrogram: No   Medications:  1 mL lidocaine 1 %; 40 mg triamcinolone acetonide 40 MG/ML Aspiration Attempted: Yes   Aspirate amount (mL):  0 Patient tolerance:  Patient tolerated the procedure well with no immediate complications  After informed consent was obtained the left SI joint was prepped in usual sterile fashion injected with 40 mg of Kenalog mixed with one half mL to 1% lidocaine. Patient tolerated procedure well. There was no complication Trigger Point Inj Date/Time: 01/18/2016 3:36 PM Performed by: Eliezer Lofts Authorized by: Eliezer Lofts   Consent Given by:  Patient Site marked: the procedure site was marked   Timeout: prior to procedure the correct patient, procedure, and site was verified    Indications:  Muscle spasm and pain Total # of Trigger Points:  2 Location: neck   Needle Size:  27 G Medications #1:  0.5 mL lidocaine 1 %; 10 mg triamcinolone acetonide 40 MG/ML Medications #2:  0.5 mL lidocaine 1 %; 10 mg triamcinolone acetonide 40 MG/ML Comments: Bilateral trapezius muscles were injected with 10 mg of Kenalog mixed with half millimeter of 1% lidocaine    Allergies: Sulfa antibiotics; Gluten meal; Lactose intolerance (gi); Flagyl [metronidazole hcl]; and Morphine and related   Assessment / Plan: Visit Diagnoses: Fatigue, unspecified type  Fibromyalgia  Insomnia, unspecified type  Osteoarthritis of both hands, unspecified osteoarthritis type  Osteoarthritis of ankle and foot, unspecified laterality   Bilateral trapezius muscles are painful so we will give cortisone injections 10 mg of Kenalog mixed with 0.5 and also 1% lidocaine without epinephrine. Bilaterally.  Left SI joint is hurting as well and we will give 40 mg of Kenalog mixed with 1.33m 1% lidocaine. Patient tolerated procedure well. There are no complications.  No med refills needed today  Patient states that she is 50-75% better after the injections.  Orders: Orders Placed This Encounter  Procedures  . Large Joint Injection/Arthrocentesis  . Trigger Point Injection   No orders of the defined types were placed in this encounter.   Face-to-face time spent with patient was 40 minutes. 50% of time was spent in counseling and coordination of care.  Follow-Up Instructions: Return in about 6 months (around 07/17/2016) for Fibromyalga, fatigue, insomnia, left si jt pain , trapezius pain.   NEliezer Lofts PA-C   I examined and evaluated the patient with NEliezer LoftsPA. The plan of care was discussed as noted above.  SBo Merino MD

## 2016-01-17 ENCOUNTER — Encounter: Payer: Self-pay | Admitting: *Deleted

## 2016-01-18 ENCOUNTER — Ambulatory Visit (INDEPENDENT_AMBULATORY_CARE_PROVIDER_SITE_OTHER): Payer: Medicare Other | Admitting: Rheumatology

## 2016-01-18 ENCOUNTER — Encounter: Payer: Self-pay | Admitting: Rheumatology

## 2016-01-18 VITALS — BP 123/66 | HR 67 | Resp 13 | Ht 62.0 in | Wt 126.0 lb

## 2016-01-18 DIAGNOSIS — M19079 Primary osteoarthritis, unspecified ankle and foot: Secondary | ICD-10-CM | POA: Diagnosis not present

## 2016-01-18 DIAGNOSIS — J301 Allergic rhinitis due to pollen: Secondary | ICD-10-CM | POA: Diagnosis not present

## 2016-01-18 DIAGNOSIS — M797 Fibromyalgia: Secondary | ICD-10-CM | POA: Diagnosis not present

## 2016-01-18 DIAGNOSIS — M62838 Other muscle spasm: Secondary | ICD-10-CM

## 2016-01-18 DIAGNOSIS — G4700 Insomnia, unspecified: Secondary | ICD-10-CM

## 2016-01-18 DIAGNOSIS — J3081 Allergic rhinitis due to animal (cat) (dog) hair and dander: Secondary | ICD-10-CM | POA: Diagnosis not present

## 2016-01-18 DIAGNOSIS — M533 Sacrococcygeal disorders, not elsewhere classified: Secondary | ICD-10-CM

## 2016-01-18 DIAGNOSIS — M19041 Primary osteoarthritis, right hand: Secondary | ICD-10-CM | POA: Diagnosis not present

## 2016-01-18 DIAGNOSIS — M19042 Primary osteoarthritis, left hand: Secondary | ICD-10-CM | POA: Diagnosis not present

## 2016-01-18 DIAGNOSIS — R5383 Other fatigue: Secondary | ICD-10-CM | POA: Diagnosis not present

## 2016-01-18 DIAGNOSIS — J3089 Other allergic rhinitis: Secondary | ICD-10-CM | POA: Diagnosis not present

## 2016-01-18 MED ORDER — LIDOCAINE HCL 1 % IJ SOLN
1.0000 mL | INTRAMUSCULAR | Status: AC | PRN
  Administered 2016-01-18: 1 mL

## 2016-01-18 MED ORDER — LIDOCAINE HCL 1 % IJ SOLN
0.5000 mL | INTRAMUSCULAR | Status: AC | PRN
  Administered 2016-01-18: .5 mL

## 2016-01-18 MED ORDER — TRIAMCINOLONE ACETONIDE 40 MG/ML IJ SUSP
10.0000 mg | INTRAMUSCULAR | Status: AC | PRN
  Administered 2016-01-18: 10 mg via INTRAMUSCULAR

## 2016-01-18 MED ORDER — TRIAMCINOLONE ACETONIDE 40 MG/ML IJ SUSP
40.0000 mg | INTRAMUSCULAR | Status: AC | PRN
  Administered 2016-01-18: 40 mg via INTRA_ARTICULAR

## 2016-01-19 DIAGNOSIS — F3113 Bipolar disorder, current episode manic without psychotic features, severe: Secondary | ICD-10-CM | POA: Diagnosis not present

## 2016-01-31 DIAGNOSIS — K52832 Lymphocytic colitis: Secondary | ICD-10-CM | POA: Diagnosis not present

## 2016-01-31 DIAGNOSIS — K580 Irritable bowel syndrome with diarrhea: Secondary | ICD-10-CM | POA: Diagnosis not present

## 2016-01-31 DIAGNOSIS — R1031 Right lower quadrant pain: Secondary | ICD-10-CM | POA: Diagnosis not present

## 2016-01-31 DIAGNOSIS — K591 Functional diarrhea: Secondary | ICD-10-CM | POA: Diagnosis not present

## 2016-01-31 DIAGNOSIS — R1032 Left lower quadrant pain: Secondary | ICD-10-CM | POA: Diagnosis not present

## 2016-02-03 DIAGNOSIS — J3089 Other allergic rhinitis: Secondary | ICD-10-CM | POA: Diagnosis not present

## 2016-02-03 DIAGNOSIS — J3081 Allergic rhinitis due to animal (cat) (dog) hair and dander: Secondary | ICD-10-CM | POA: Diagnosis not present

## 2016-02-03 DIAGNOSIS — J301 Allergic rhinitis due to pollen: Secondary | ICD-10-CM | POA: Diagnosis not present

## 2016-02-11 DIAGNOSIS — J3081 Allergic rhinitis due to animal (cat) (dog) hair and dander: Secondary | ICD-10-CM | POA: Diagnosis not present

## 2016-02-11 DIAGNOSIS — J3089 Other allergic rhinitis: Secondary | ICD-10-CM | POA: Diagnosis not present

## 2016-02-11 DIAGNOSIS — J301 Allergic rhinitis due to pollen: Secondary | ICD-10-CM | POA: Diagnosis not present

## 2016-02-11 DIAGNOSIS — K589 Irritable bowel syndrome without diarrhea: Secondary | ICD-10-CM | POA: Diagnosis not present

## 2016-02-14 DIAGNOSIS — K589 Irritable bowel syndrome without diarrhea: Secondary | ICD-10-CM | POA: Diagnosis not present

## 2016-02-14 DIAGNOSIS — F3181 Bipolar II disorder: Secondary | ICD-10-CM | POA: Diagnosis not present

## 2016-02-14 DIAGNOSIS — G43909 Migraine, unspecified, not intractable, without status migrainosus: Secondary | ICD-10-CM | POA: Diagnosis not present

## 2016-02-16 DIAGNOSIS — R0683 Snoring: Secondary | ICD-10-CM | POA: Diagnosis not present

## 2016-02-16 DIAGNOSIS — G4710 Hypersomnia, unspecified: Secondary | ICD-10-CM | POA: Diagnosis not present

## 2016-02-16 DIAGNOSIS — G4719 Other hypersomnia: Secondary | ICD-10-CM | POA: Diagnosis not present

## 2016-02-16 DIAGNOSIS — R0681 Apnea, not elsewhere classified: Secondary | ICD-10-CM | POA: Diagnosis not present

## 2016-02-24 DIAGNOSIS — J3081 Allergic rhinitis due to animal (cat) (dog) hair and dander: Secondary | ICD-10-CM | POA: Diagnosis not present

## 2016-02-24 DIAGNOSIS — R0681 Apnea, not elsewhere classified: Secondary | ICD-10-CM | POA: Diagnosis not present

## 2016-02-24 DIAGNOSIS — J3089 Other allergic rhinitis: Secondary | ICD-10-CM | POA: Diagnosis not present

## 2016-02-24 DIAGNOSIS — J301 Allergic rhinitis due to pollen: Secondary | ICD-10-CM | POA: Diagnosis not present

## 2016-03-03 ENCOUNTER — Ambulatory Visit (INDEPENDENT_AMBULATORY_CARE_PROVIDER_SITE_OTHER): Payer: Medicare Other | Admitting: Gynecology

## 2016-03-03 ENCOUNTER — Encounter: Payer: Self-pay | Admitting: Gynecology

## 2016-03-03 VITALS — BP 118/74 | Ht 62.0 in | Wt 129.0 lb

## 2016-03-03 DIAGNOSIS — M8580 Other specified disorders of bone density and structure, unspecified site: Secondary | ICD-10-CM

## 2016-03-03 DIAGNOSIS — J3089 Other allergic rhinitis: Secondary | ICD-10-CM | POA: Diagnosis not present

## 2016-03-03 DIAGNOSIS — Z01411 Encounter for gynecological examination (general) (routine) with abnormal findings: Secondary | ICD-10-CM | POA: Diagnosis not present

## 2016-03-03 DIAGNOSIS — N951 Menopausal and female climacteric states: Secondary | ICD-10-CM | POA: Diagnosis not present

## 2016-03-03 DIAGNOSIS — N952 Postmenopausal atrophic vaginitis: Secondary | ICD-10-CM | POA: Diagnosis not present

## 2016-03-03 DIAGNOSIS — J301 Allergic rhinitis due to pollen: Secondary | ICD-10-CM | POA: Diagnosis not present

## 2016-03-03 DIAGNOSIS — J3081 Allergic rhinitis due to animal (cat) (dog) hair and dander: Secondary | ICD-10-CM | POA: Diagnosis not present

## 2016-03-03 MED ORDER — PROGESTERONE MICRONIZED 100 MG PO CAPS: 100.0000 mg | ORAL_CAPSULE | Freq: Every day | ORAL | 11 refills | Status: DC

## 2016-03-03 MED ORDER — ESTRADIOL 0.05 MG/24HR TD PTTW: 1.0000 | MEDICATED_PATCH | TRANSDERMAL | 12 refills | Status: DC

## 2016-03-03 NOTE — Patient Instructions (Signed)
Started on the hormone replacement as we discussed. Call me if you have any issues, questions or do any vaginal bleeding.

## 2016-03-03 NOTE — Progress Notes (Signed)
Danielle Bruce Sep 22, 1960 [id number]        56 y.o.  [id number] for breast and pelvic exam.  Patient has questions about HRT whether she wants to start this. Saw neurologist due to her migraine headaches and was discussed with her that they appear to be hormone related and that starting on HRT may be beneficial. She also is having issues with mood swings although she does have a history of depression. She's having issues with sleeping but not having significant hot flushes. No significant vaginal dryness.  Past medical history,surgical history, problem list, medications, allergies, family history and social history were all reviewed and documented as reviewed in the EPIC chart.  ROS:  Performed with pertinent positives and negatives included in the history, assessment and plan.   Additional significant findings :  None   Exam: Danielle Bruce assistant Vitals:   03/03/16 1504  BP: 118/74  Weight: 129 lb (58.5 kg)  Height: 5\' 2"  (1.575 m)   Body mass index is 23.59 kg/m.  General appearance:  Normal affect, orientation and appearance. Skin: Grossly normal HEENT: Without gross lesions.  No cervical or supraclavicular adenopathy. Thyroid normal.  Lungs:  Clear without wheezing, rales or rhonchi Cardiac: RR, without RMG Abdominal:  Soft, nontender, without masses, guarding, rebound, organomegaly or hernia Breasts:  Examined lying and sitting without masses, retractions, discharge or axillary adenopathy. Pelvic:  Ext, BUS, Vagina with mild atrophic changes  Cervix with atrophic changes  Uterus anteverted, normal size, shape and contour, midline and mobile nontender   Adnexa without masses or tenderness    Anus and perineum normal   Rectovaginal normal sphincter tone without palpated masses or tenderness.    Assessment/Plan:  56 y.o. [id number] female for breast and pelvic exam.  1. Menopausal symptoms. Patient is having sleep disturbances and some mood swings. Having migraines that  started as menstrual migraines when she was still having menses but now is having more frequent almost daily headaches. Was evaluated by neurology by her history this past summer was suggested that these are hormone related and that may be starting HRT would be beneficial. I reviewed the whole issue of HRT to include the latest names 2017 guidelines. I discussed benefits to include symptom relief which may help with her sleeping, emotional swings, musculoskeletal discomfort although she had does have a history of fibromyalgia and her migraine headaches. Potential for cardiovascular/bone health when started early was also discussed. She was diagnosed with osteopenia by her primary physician's who are following her with her bone densities. Risks to include a realistic understanding of stroke heart attack DVT particularly with her migraine headache history reviewed. The issues of breast cancer with increased risk as shown in the WHI study was also discussed. After a lengthy discussion and a shared decision process patient elects to start HRT. Various regimens to include oral transdermal and transvaginal absorption reviewed. Will start with Vivelle 0.05 mg patches twice weekly and Prometrium 100 mg nightly. We'll see how she does over the next month or 2. If she achieves a satisfactory results as far as her symptoms then we will continue on this regimen. If she does not or has any issues/questions she knows to call me or follow up in the office. She also knows to call me if she does any vaginal bleeding. 2. Pap smear/HPV 11/2013. No Pap smear done today. 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 Pap smears 2014 and 2015 had both negative cytology and negative  HPV.  3. Mammography 03/2015. Patient knows to schedule next month agrees to do so. SBE monthly reviewed. 4. Colonoscopy 2016. Repeat at their recommended interval. 5. Osteopenia. I do not have copies of her bone densities but she  reports being told she had osteopenia is being followed for this by her primary physicians. 6. Health maintenance. No routine lab work done as this is done elsewhere. Follow up in one year, sooner if any issues with HRT.  Additional  time in excess of her breast and pelvic exam was spent in direct face to face counseling and coordination of care in regards to her menopausal symptoms, migraine headaches, osteopenia and HRT discussion with ultimate initiation of medication.    Anastasio Auerbach MD, 3:34 PM 03/03/2016

## 2016-03-07 ENCOUNTER — Ambulatory Visit: Payer: Medicare Other | Attending: Family Medicine | Admitting: Physical Therapy

## 2016-03-07 ENCOUNTER — Encounter: Payer: Self-pay | Admitting: Physical Therapy

## 2016-03-07 DIAGNOSIS — R293 Abnormal posture: Secondary | ICD-10-CM | POA: Diagnosis not present

## 2016-03-07 DIAGNOSIS — G44209 Tension-type headache, unspecified, not intractable: Secondary | ICD-10-CM | POA: Diagnosis not present

## 2016-03-07 DIAGNOSIS — M6281 Muscle weakness (generalized): Secondary | ICD-10-CM

## 2016-03-07 NOTE — Patient Instructions (Signed)
Pectoral Stretch, Supine on Foam Roller    Lie on back along length of 6 inch full roller and allow arms to spread wide, keeping elbows bent 90. To increase stretch, reach arms over head. Hold _60__ seconds. Repeat _3__ times per session. Do __1_ sessions per day.  Copyright  VHI. All rights reserved.   Foam roller - [employer] [address] [facility] [address], [place], [place] [address] Phone # [phone] Fax [phone]

## 2016-03-08 ENCOUNTER — Telehealth: Payer: Self-pay | Admitting: *Deleted

## 2016-03-08 NOTE — Telephone Encounter (Signed)
PA done via [employer] for estradiol 0.5 mg twice weekly patches and progesterone 100 mg has been approved 03/08/17. Pharmacy informed with the below note.

## 2016-03-09 ENCOUNTER — Ambulatory Visit: Payer: Medicare Other | Admitting: Physical Therapy

## 2016-03-09 ENCOUNTER — Other Ambulatory Visit: Payer: Self-pay | Admitting: Gynecology

## 2016-03-09 DIAGNOSIS — J301 Allergic rhinitis due to pollen: Secondary | ICD-10-CM | POA: Diagnosis not present

## 2016-03-09 DIAGNOSIS — J3081 Allergic rhinitis due to animal (cat) (dog) hair and dander: Secondary | ICD-10-CM | POA: Diagnosis not present

## 2016-03-09 DIAGNOSIS — Z1231 Encounter for screening mammogram for malignant neoplasm of breast: Secondary | ICD-10-CM

## 2016-03-09 DIAGNOSIS — J3089 Other allergic rhinitis: Secondary | ICD-10-CM | POA: Diagnosis not present

## 2016-03-09 NOTE — Therapy (Signed)
[facility] [address] [place], [place], [address] Phone: [phone]   Fax:  [phone]  Physical Therapy Evaluation  Patient Details  Name: Danielle Bruce MRN: [id number] Date of Birth: 1961/01/24 Referring Provider: Mayra Neer, MD  Encounter Date: 03/07/2016    PT Visits / Re-Eval  Visit Number 1 byJacquelineDCrosser,PT at01/09/181801   Number of Visits 10 byJacquelineDCrosser,PT at01/09/181801   Date for PT Re-Evaluation 05/02/2016   Authorization  Authorization Type Gcodes at 10th visit   Authorization Time Period    Authorization - Visit Number    Authorization - Number of Visits    PT Time Calculation  PT Start Time 1530   PT Stop Time 1613   PT Time Calculation (min) 43 min (calculated)   PT - End of Session  Equipment Utilized During Treatment    Activity Tolerance Patient tolerated treatment well   Behavior During Therapy [facility] for tasks assessed/performed     Past Medical History:  Diagnosis Date  . Anemia   . Anxiety   . Arthritis    osteoarthritis  . ASCUS (atypical squamous cells of undetermined significance) on Pap smear 05/06/05   NEG HIGH RISK HPV--C&B BIOPSY BENIGN 12/2005  . Asthma   . Bipolar 2 disorder ([place])   . Cancer ([place])    skin cancer - basal cell  . Colon polyps   . Complication of anesthesia    anxious afterwards, will get headaches   . Constipation   . Depression   . Fibromyalgia 10/2013  . GERD (gastroesophageal reflux disease)   . 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 Heart murmur    never had any problems  . Hemorrhoids   . High cholesterol   . 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   cytology negative  . IBS (irritable bowel syndrome)   . Insomnia   . Lymphocytic colitis   . 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 Migraines   . Osteoarthritis   . Osteopenia   .  Peripheral neuropathy ([place])   . PTSD (post-traumatic stress disorder)     Past Surgical History:  Procedure Laterality Date  . BONE MARROW BIOPSY Left 12/18/2013   Plasma cell dyscrasia 8% population of plasma cells  . CESAREAN SECTION  [phone]  . CHOLECYSTECTOMY N/A 10/16/2012   Procedure: LAPAROSCOPIC CHOLECYSTECTOMY;  Surgeon: Joyice Faster. Cornett, MD;  Location: [facility];  Service: General;  Laterality: N/A;  . COLONOSCOPY     numerous times  . DILATION AND CURETTAGE OF UTERUS    . ESOPHAGOGASTRODUODENOSCOPY    . [place]   x3  . IUD REMOVAL  02/2015   Mirena  . LAPAROSCOPIC LYSIS OF ADHESIONS N/A 10/16/2012   Procedure: LAPAROSCOPIC LYSIS OF ADHESIONS;  Surgeon: Joyice Faster. Cornett, MD;  Location: [facility];  Service: General;  Laterality: N/A;  . LAPAROSCOPY N/A 10/16/2012   Procedure: LAPAROSCOPY DIAGNOSTIC;  Surgeon: Joyice Faster. Cornett, MD;  Location: [facility];  Service: General;  Laterality: N/A;  . PELVIC LAPAROSCOPY    . RADIOLOGY WITH ANESTHESIA N/A 12/16/2015   Procedure: MRI OF BRAIN WITH AND WITHOUT CONTRAST;  Surgeon: Medication Radiologist, MD;  Location: [facility];  Service: Radiology;  Laterality: N/A;  . SHOULDER SURGERY  2007/2008  . SPINE SURGERY  2010   cervical    There were no vitals filed for this visit.  Symptoms/Limitations  Subjective Pt states she  has migrains, but also has other HA that are caused by a multiple variety of reasons. She states her doctor was thinking that PT would help for some of the headache issues. States she sometimes wakes up with the HA, but today HA didn't start until later in the afternoon. Once the pain gets bad she can't do anything. She is seeing eurologist for the migrains and states the medicine is helping those, but the other HA are bothersome.   Pt states she has migrains, but also has other HA that are caused by a multiple variety of reasons. She states her doctor was thinking that PT would help for some of the headache  issues. States she sometimes wakes up with the HA, but today HA didn't start until later in the afternoon. Once the pain gets bad she can't do anything. She is seeing eurologist for the migrains and states the medicine is helping those, but the other HA are bothersome.  byJacquelineDCrosser,PT at01/09/181532   Patient is accompained by:   by at12/30/990000   Pertinent History fibromyalgia, cervical fusion, migrains byJacquelineDCrosser,PT at01/09/181532  fibromyalgia, cervical fusion, migrains byJacquelineDCrosser,PT at01/09/181532   Limitations  Reading; Other (comment) (stress and sometimes not sure) byJacquelineDCrosser,PT at01/09/181532   Reading; Other (comment) (stress and sometimes not sure) byJacquelineDCrosser,PT at01/09/181532   How long can you sit comfortably?   by at12/30/990000   How long can you stand comfortably?   by at12/30/990000   How long can you walk comfortably?   by at12/30/990000   Diagnostic tests   by at12/30/990000   Patient Stated Goals Be able to do things like go to son's going away party  Be able to do things like go to son's going away party byJacquelineDCrosser,PT at01/09/181532   Pain Assessment  Currently in Pain? Yes  Yes byJacquelineDCrosser,PT at01/09/181532   Pain Score 8   8  byJacquelineDCrosser,PT at01/09/181532   Pain Location Head  Head byJacquelineDCrosser,PT at01/09/181532   Pain Orientation Right; Left  Right; Left byJacquelineDCrosser,PT at01/09/181532   Pain Descriptors / Indicators Constant; Throbbing; Nagging; Sharp; Squeezing  Constant; Throbbing; Nagging; Sharp; Squeezing byJacquelineDCrosser,PT at01/09/181532   Pain Type   by at12/30/990000   Pain Radiating Towards   by at12/30/990000   Pain Onset More than a month ago byJacquelineDCrosser,PT at01/09/181532  More than a month  ago byJacquelineDCrosser,PT at01/09/181532   Pain Frequency Intermittent byJacquelineDCrosser,PT at01/09/181532  Intermittent byJacquelineDCrosser,PT at01/09/181532   Aggravating Factors  stress, tight muscle byJacquelineDCrosser,PT at01/09/181532  stress, tight muscle byJacquelineDCrosser,PT at01/09/181532   Pain Relieving Factors ice, recliner or lay down byJacquelineDCrosser,PT at01/09/181532  ice, recliner or lay down byJacquelineDCrosser,PT at01/09/181532   Effect of Pain on Daily Activities can't drive, can't cook byJacquelineDCrosser,PT at01/09/181532  can't drive, can't cook byJacquelineDCrosser,PT at01/09/181532   Multiple Pain Sites No              [facility] PT Assessment - 03/09/16 0001      Assessment   Medical Diagnosis G43.909 Migraine, unspecified, not intractable, without status migrainosus   Onset Date/Surgical Date --  having HA since November   Prior Therapy no     Precautions   Precautions None     Restrictions   Weight Bearing Restrictions No     Home Ecologist residence     Prior Function   Level of Independence Independent   Vocation On disability     Cognition   Overall Cognitive Status Within Functional Limits for tasks assessed     Posture/Postural Control   Posture/Postural Control Postural limitations  Postural Limitations Rounded Shoulders;Increased thoracic kyphosis     AROM   Cervical Flexion WFL - no discomfort   Cervical Extension 55 degrees +pain   Cervical - Right Side Bend 25% limitation   Cervical - Left Side Bend 25% limitation   Cervical - Right Rotation 60 degrees + pain   Cervical - Left Rotation 30 degrees +pain     Strength   Right Shoulder Flexion 4/5   Right Shoulder Extension 5/5   Right Shoulder ABduction 4/5   Left Shoulder Flexion 5/5   Left Shoulder Extension 5/5   Left Shoulder ABduction 5/5     Palpation    Palpation comment tirigger points and tender to palpation suboccipitals and cervical paraspinals, pec major and minor bilateral     Spurling's   Findings Positive   Side Right     Distraction Test   Findngs Positive   side --  decreases pain     Ambulation/Gait   Gait Pattern Within Functional Limits                   [facility] Adult PT Treatment/Exercise - 03/09/16 0001      Neck Exercises: Supine   Other Supine Exercise pec stretch in supine - educated and performed      Manual Therapy   Manual Therapy Myofascial release   Myofascial Release posterior cervical region and suboccipitals          Plan  Clinical Impression Statement Pt seen for moderate complexity evaluation due to unstable condition, addressing fascial restrictions, cervical movements and strength, shoulder strength, scapular movements, and posture and alignment during the evaluation. Pt has multiple comorbidities that may effect treatment including cervical fusion, fibromyalgia, and polypharmacy. Pt reports her doctor is looking into reducing some medications in attempt to reducing symtoms of the daily HA. Pt also presents with elevated stress/anxiety as reported by patient. Pt has reduced AROM of cervical rotatoin by 25% with increased pain. Pt has muscle tightness and fasdcial restrictions in cranial aponeurosis and posterior cervical spine as well as bilateral pec major and minor. Pt positive for distraction and spurlings test. Pt has shoulder ROM WNL but with neck pain. Pt has difficulty coordinating muscle activity for diaphragmatic breathing. Poor posture with rounded shoulders and reduced postural stability during functional activities such as reading. Pt will benefit from skilled PT to address impairmenmts for reduction in symptom of HA so that patient can more fully participate in activities.   Pt will benefit from skilled therapeutic intervention in order to improve on the following deficits Decreased  activity tolerance; Decreased coordination; Decreased endurance; Decreased mobility; Decreased range of motion; Decreased strength; Hypomobility; Increased fascial restricitons; Increased muscle spasms; Postural dysfunction; Pain   Rehab Potential Good   Clinical Impairments Affecting Rehab Potential fibromyalgia, cervical fusions, migrains   PT Frequency 2x / week byJacquelineDCrosser,PT at01/09/181807   PT Duration 8 weeks byJacquelineDCrosser,PT at01/09/181807   PT Treatment/Interventions ADLs/Self Care Home Management; Cryotherapy; Electrical Stimulation; Moist Heat; Traction; Functional mobility training; Therapeutic activities; Therapeutic exercise; Balance training; Neuromuscular re-education; Patient/family education; Manual techniques; Passive range of motion; Dry needling; Taping   PT Next Visit Plan manual technique for cervical and thoracic mobility, diaphramatic breathing, chest opening byJacquelineDCrosser,PT at01/09/181807   PT Home Exercise Plan diaphragmatic breathing byJacquelineDCrosser,PT at01/09/181807   Recommended Other Services none   Consulted and Agree with Plan of Care Patient             PT Short Term Goals - 03/09/16 3546  PT SHORT TERM GOAL #1   Title be independent in initial HEP   Time 4   Period Weeks   Status New     PT SHORT TERM GOAL #2   Title report 30% less HA throughout the day   Time 4   Period Weeks   Status New     PT SHORT TERM GOAL #3   Title able to read for 30 minutes without inducing a HA   Time 4   Period Weeks   Status New           PT Long Term Goals - 03/09/16 0759      PT LONG TERM GOAL #1   Title be independent in advanced HEP   Time 8   Period Weeks   Status New     PT LONG TERM GOAL #2   Title demonstrate cervical AROM within functional limits and without increased pain.   Time 8   Period Weeks   Status New     PT LONG TERM GOAL #3   Title report a 60% reduction  in HA throughout the day   Time 8   Period Weeks   Status New     PT LONG TERM GOAL #4   Title demonstrate 4 to 4+/5 bilateral shoulder strength without increased pain   Time 8   Period Weeks   Status New     PT LONG TERM GOAL #5   Title ...           PT G-Codes  Functional Assessment Tool Used clinical judgement byJacquelineDCrosser,PT at01/09/181804   Functional Limitation Changing and maintaining body position byJacquelineDCrosser,PT at01/09/181804   Changing and Maintaining Body Position Current Status [phone]) At least 40 percent but less than 60 percent impaired, limited or restricted   Changing and Maintaining Body Position Goal Status ([id number]) At least 20 percent but less than 40 percent impaired, limited or restricted   Changing and Maintaining Body Position Discharge Status [phone])         Patient will benefit from skilled therapeutic intervention in order to improve the following deficits and impairments:  Decreased activity tolerance, Decreased coordination, Decreased endurance, Decreased mobility, Decreased range of motion, Decreased strength, Hypomobility, Increased fascial restricitons, Increased muscle spasms, Postural dysfunction, Pain  Visit Diagnosis: Tension-type headache, not intractable, unspecified chronicity pattern  Muscle weakness (generalized)  Abnormal posture     Problem List Patient Active Problem List   Diagnosis Date Noted  . Fibromyalgia 01/13/2016  . MGUS (monoclonal gammopathy of unknown significance) 12/23/2013  . Chronic cholecystitis without calculus 10/18/2012  . Abdominal pain, unspecified site 10/18/2012  . Nausea alone 10/18/2012  . Unspecified constipation 10/18/2012  . Depression 10/18/2012  . Bipolar 2 disorder ([place]) 10/18/2012  . Anxiety   . ASCUS (atypical squamous cells of undetermined significance) on Pap smear   . IUD   . Hemorrhoids 11/29/2010  . Abdominal pain, left upper quadrant 11/29/2010     Zannie Cove, PT 03/09/2016, 8:01 AM  [facility] [address], [place], [place], [address] Phone: [phone]   Fax:  [phone]  Name: Danielle Bruce MRN: [id number] Date of Birth: 1960/04/03

## 2016-03-13 ENCOUNTER — Encounter: Payer: Self-pay | Admitting: Physical Therapy

## 2016-03-13 ENCOUNTER — Ambulatory Visit: Payer: Medicare Other | Admitting: Physical Therapy

## 2016-03-13 DIAGNOSIS — R293 Abnormal posture: Secondary | ICD-10-CM

## 2016-03-13 DIAGNOSIS — M6281 Muscle weakness (generalized): Secondary | ICD-10-CM

## 2016-03-13 DIAGNOSIS — G44209 Tension-type headache, unspecified, not intractable: Secondary | ICD-10-CM

## 2016-03-13 NOTE — Patient Instructions (Signed)
Neck: Lateral Tilt    Support child's trunk and shoulder. Start with head in center. Child gently tilts toward right shoulder. Keep chin tucked and tilt head back. Look up. Do not allow trunk or shoulders to move. Child may assist. Hold __30__ seconds. Repeat __3__ times. Do __1__ sessions per day. CAUTION: Movement should be gentle, steady and slow.  Copyright  VHI. All rights reserved.  Neck: Rotation    [facility] [address] [place], [place] [address] Phone # [phone] Fax [phone]

## 2016-03-13 NOTE — Therapy (Signed)
[facility] [address] [place], [place], [address] Phone: [phone]   Fax:  [phone]  Physical Therapy Treatment  Patient Details  Name: Danielle Bruce MRN: [id number] Date of Birth: 02/16/1961 Referring Provider: Mayra Neer, MD  Encounter Date: 03/13/2016      PT End of Session - 03/13/16 1623    Visit Number 2   Number of Visits 10   Date for PT Re-Evaluation 05/02/16   Authorization Type Gcodes at 10th visit   PT Start Time 1533   PT Stop Time 1621   PT Time Calculation (min) 48 min   Activity Tolerance Patient tolerated treatment well   Behavior During Therapy [facility] for tasks assessed/performed      Past Medical History:  Diagnosis Date  . Anemia   . Anxiety   . Arthritis    osteoarthritis  . ASCUS (atypical squamous cells of undetermined significance) on Pap smear 05/06/05   NEG HIGH RISK HPV--C&B BIOPSY BENIGN 12/2005  . Asthma   . Bipolar 2 disorder ([place])   . Cancer ([place])    skin cancer - basal cell  . Colon polyps   . Complication of anesthesia    anxious afterwards, will get headaches   . Constipation   . Depression   . Fibromyalgia 10/2013  . GERD (gastroesophageal reflux disease)   . 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 Heart murmur    never had any problems  . Hemorrhoids   . High cholesterol   . 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   cytology negative  . IBS (irritable bowel syndrome)   . Insomnia   . Lymphocytic colitis   . 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 Migraines   . Osteoarthritis   . Osteopenia   . Peripheral neuropathy ([place])   . PTSD (post-traumatic stress disorder)     Past Surgical History:  Procedure Laterality Date  . BONE MARROW BIOPSY Left 12/18/2013   Plasma cell dyscrasia 8% population of plasma cells  . CESAREAN SECTION  [phone]  . CHOLECYSTECTOMY N/A 10/16/2012    Procedure: LAPAROSCOPIC CHOLECYSTECTOMY;  Surgeon: Joyice Faster. Cornett, MD;  Location: [facility];  Service: General;  Laterality: N/A;  . COLONOSCOPY     numerous times  . DILATION AND CURETTAGE OF UTERUS    . ESOPHAGOGASTRODUODENOSCOPY    . [place]   x3  . IUD REMOVAL  02/2015   Mirena  . LAPAROSCOPIC LYSIS OF ADHESIONS N/A 10/16/2012   Procedure: LAPAROSCOPIC LYSIS OF ADHESIONS;  Surgeon: Joyice Faster. Cornett, MD;  [facility];  Service: General;  Laterality: N/A;  . LAPAROSCOPY N/A 10/16/2012   Procedure: LAPAROSCOPY DIAGNOSTIC;  Surgeon: Joyice Faster. Cornett, MD;  Location: [facility];  Service: General;  Laterality: N/A;  . PELVIC LAPAROSCOPY    . RADIOLOGY WITH ANESTHESIA N/A 12/16/2015   Procedure: MRI OF BRAIN WITH AND WITHOUT CONTRAST;  Surgeon: Medication Radiologist, MD;  Location: [place];  Service: Radiology;  Laterality: N/A;  . SHOULDER SURGERY  2007/2008  . SPINE SURGERY  2010   cervical    There were no vitals filed for this visit.      Subjective Assessment - 03/13/16 1539    Subjective States she had a very bad HA this weekend and woke up with migrain on Sunday.  States she didn't get much relief from pain med and used ice packs  on her head.  States she had more stress this weekend due to her son moving away.   Pertinent History fibromyalgia, cervical fusion, migrains   Limitations Reading;Other (comment)   Patient Stated Goals Be able to do things like go to son's going away party   Currently in Pain? Yes   Pain Score 3    Pain Location Head   Pain Orientation Left;Right  behind eyes   Pain Onset More than a month ago   Multiple Pain Sites No                         [facility] Adult PT Treatment/Exercise - 03/13/16 0001      Neck Exercises: Seated   Other Seated Exercise SCM stretch     Manual Therapy   Manual Therapy Myofascial release;Soft tissue mobilization   Soft tissue mobilization suboccipital, cervical musculature, temporalis,  masseter   Myofascial Release cervical muscles                PT Education - 03/13/16 1623    Education provided Yes   Education Details added SCM stretches to [employer]) Educated Patient   Methods Explanation;Demonstration;Tactile cues;Verbal cues;Handout   Comprehension Verbalized understanding;Returned demonstration          PT Short Term Goals - 03/09/16 0756      PT SHORT TERM GOAL #1   Title be independent in initial HEP   Time 4   Period Weeks   Status New     PT SHORT TERM GOAL #2   Title report 30% less HA throughout the day   Time 4   Period Weeks   Status New     PT SHORT TERM GOAL #3   Title able to read for 30 minutes without inducing a HA   Time 4   Period Weeks   Status New           PT Long Term Goals - 03/09/16 0759      PT LONG TERM GOAL #1   Title be independent in advanced HEP   Time 8   Period Weeks   Status New     PT LONG TERM GOAL #2   Title demonstrate cervical AROM within functional limits and without increased pain.   Time 8   Period Weeks   Status New     PT LONG TERM GOAL #3   Title report a 60% reduction in HA throughout the day   Time 8   Period Weeks   Status New     PT LONG TERM GOAL #4   Title demonstrate 4 to 4+/5 bilateral shoulder strength without increased pain   Time 8   Period Weeks   Status New     PT LONG TERM GOAL #5   Title .Marland KitchenMarland Kitchen               Plan - 03/13/16 1726    Clinical Impression Statement No progress to report due to first treatment after eval.  Pt was has significant muscle and fascial tension especially Lt SCM and masseter.  Pt demonstrates ability to perform SCM stretch correctly and was added to HEP.  Will need skilled PT in order to imrpove ROM and for pain management so pateint can participate in activities without increased HA.   Rehab Potential Good   Clinical Impairments Affecting Rehab Potential fibromyalgia, cervical fusions, migrains   PT Frequency 2x / week    PT  Duration 8 weeks   PT Treatment/Interventions ADLs/Self Care Home Management;Cryotherapy;Electrical Stimulation;Moist Heat;Traction;Functional mobility training;Therapeutic activities;Therapeutic exercise;Balance training;Neuromuscular re-education;Patient/family education;Manual techniques;Passive range of motion;Dry needling;Taping   PT Next Visit Plan manual technique for cervical and thoracic mobility, diaphramatic breathing, chest opening; review SCM stretch   Consulted and Agree with Plan of Care Patient      Patient will benefit from skilled therapeutic intervention in order to improve the following deficits and impairments:  Decreased activity tolerance, Decreased coordination, Decreased endurance, Decreased mobility, Decreased range of motion, Decreased strength, Hypomobility, Increased fascial restricitons, Increased muscle spasms, Postural dysfunction, Pain  Visit Diagnosis: Tension-type headache, not intractable, unspecified chronicity pattern  Muscle weakness (generalized)  Abnormal posture     Problem List Patient Active Problem List   Diagnosis Date Noted  . Fibromyalgia 01/13/2016  . MGUS (monoclonal gammopathy of unknown significance) 12/23/2013  . Chronic cholecystitis without calculus 10/18/2012  . Abdominal pain, unspecified site 10/18/2012  . Nausea alone 10/18/2012  . Unspecified constipation 10/18/2012  . Depression 10/18/2012  . Bipolar 2 disorder ([place]) 10/18/2012  . Anxiety   . ASCUS (atypical squamous cells of undetermined significance) on Pap smear   . IUD   . Hemorrhoids 11/29/2010  . Abdominal pain, left upper quadrant 11/29/2010    Zannie Cove, PT 03/13/2016, 5:47 PM  [facility] [address], [place], [place], [address] Phone: [phone]   Fax:  [phone]  Name: NOE PITTSLEY MRN: [id number] Date of Birth: 1960-07-21

## 2016-03-16 ENCOUNTER — Encounter: Payer: Medicare Other | Admitting: Physical Therapy

## 2016-03-20 ENCOUNTER — Encounter: Payer: Self-pay | Admitting: Physical Therapy

## 2016-03-20 ENCOUNTER — Ambulatory Visit: Payer: Medicare Other | Admitting: Physical Therapy

## 2016-03-20 DIAGNOSIS — J3081 Allergic rhinitis due to animal (cat) (dog) hair and dander: Secondary | ICD-10-CM | POA: Diagnosis not present

## 2016-03-20 DIAGNOSIS — R293 Abnormal posture: Secondary | ICD-10-CM

## 2016-03-20 DIAGNOSIS — J301 Allergic rhinitis due to pollen: Secondary | ICD-10-CM | POA: Diagnosis not present

## 2016-03-20 DIAGNOSIS — G44209 Tension-type headache, unspecified, not intractable: Secondary | ICD-10-CM | POA: Diagnosis not present

## 2016-03-20 DIAGNOSIS — M6281 Muscle weakness (generalized): Secondary | ICD-10-CM

## 2016-03-20 DIAGNOSIS — J3089 Other allergic rhinitis: Secondary | ICD-10-CM | POA: Diagnosis not present

## 2016-03-20 NOTE — Therapy (Signed)
[facility] [address] [place], [place], [address] Phone: [phone]   Fax:  [phone]  Physical Therapy Treatment  Patient Details  Name: Danielle Bruce MRN: [id number] Date of Birth: July 31, 1960 Referring Provider: Mayra Neer, MD  Encounter Date: 03/20/2016      PT End of Session - 03/20/16 1612    Visit Number 3   Number of Visits 10   Date for PT Re-Evaluation 05/02/16   Authorization Type Gcodes at 10th visit   PT Start Time 1614   PT Stop Time 1711   PT Time Calculation (min) 57 min   Activity Tolerance Patient tolerated treatment well   Behavior During Therapy [facility] for tasks assessed/performed      Past Medical History:  Diagnosis Date  . Anemia   . Anxiety   . Arthritis    osteoarthritis  . ASCUS (atypical squamous cells of undetermined significance) on Pap smear 05/06/05   NEG HIGH RISK HPV--C&B BIOPSY BENIGN 12/2005  . Asthma   . Bipolar 2 disorder ([place])   . Cancer ([place])    skin cancer - basal cell  . Colon polyps   . Complication of anesthesia    anxious afterwards, will get headaches   . Constipation   . Depression   . Fibromyalgia 10/2013  . GERD (gastroesophageal reflux disease)   . 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 Heart murmur    never had any problems  . Hemorrhoids   . High cholesterol   . 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   cytology negative  . IBS (irritable bowel syndrome)   . Insomnia   . Lymphocytic colitis   . 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 Migraines   . Osteoarthritis   . Osteopenia   . Peripheral neuropathy ([place])   . PTSD (post-traumatic stress disorder)     Past Surgical History:  Procedure Laterality Date  . BONE MARROW BIOPSY Left 12/18/2013   Plasma cell dyscrasia 8% population of plasma cells  . CESAREAN SECTION  [phone]  . CHOLECYSTECTOMY N/A 10/16/2012   Procedure: LAPAROSCOPIC CHOLECYSTECTOMY;  Surgeon: Joyice Faster. Cornett, MD;  [facility];  Service: General;  Laterality: N/A;  . COLONOSCOPY     numerous times  . DILATION AND CURETTAGE OF UTERUS    . ESOPHAGOGASTRODUODENOSCOPY    . [place]   x3  . IUD REMOVAL  02/2015   Mirena  . LAPAROSCOPIC LYSIS OF ADHESIONS N/A 10/16/2012   Procedure: LAPAROSCOPIC LYSIS OF ADHESIONS;  Surgeon: Joyice Faster. Cornett, MD;  [facility];  Service: General;  Laterality: N/A;  . LAPAROSCOPY N/A 10/16/2012   Procedure: LAPAROSCOPY DIAGNOSTIC;  Surgeon: Joyice Faster. Cornett, MD;  [facility];  Service: General;  Laterality: N/A;  . PELVIC LAPAROSCOPY    . RADIOLOGY WITH ANESTHESIA N/A 12/16/2015   Procedure: MRI OF BRAIN WITH AND WITHOUT CONTRAST;  Surgeon: Medication Radiologist, MD;  Location: [place];  Service: Radiology;  Laterality: N/A;  . SHOULDER SURGERY  2007/2008  . SPINE SURGERY  2010   cervical    There were no vitals filed for this visit.      Subjective Assessment - 03/20/16 1615    Subjective States she has not been having the HA every day lately.  She states she is having one today.  Reports it is not awful but it is there.  Pertinent History fibromyalgia, cervical fusion, migrains.  Pt goes by "[name]"   Limitations Reading;Other (comment)   Currently in Pain? Yes   Pain Score 4    Pain Location Head   Pain Orientation Right;Left   Pain Onset More than a month ago   Pain Frequency Intermittent   Aggravating Factors  stress, reading, not sure all the time   Pain Relieving Factors ice, reclining   Effect of Pain on Daily Activities can't drive or cook   Multiple Pain Sites No                         [facility] Adult PT Treatment/Exercise - 03/20/16 0001      Neck Exercises: Supine   Neck Retraction 10 reps;5 secs   Capital Flexion 20 reps   Shoulder ABduction Both;20 reps  yellow band; horizontal abduction   Other Supine Exercise core  bracing and diaphragmatic breathing; alternating LE marching with bracing     Manual Therapy   Manual Therapy Myofascial release;Soft tissue mobilization   Soft tissue mobilization suboccipital, cervical musculature, temporalis, masseter, intraoral to masseter and pterygoids   Myofascial Release cervical muscles     Neck Exercises: Stretches   Upper Trapezius Stretch 2 reps;20 seconds   Levator Stretch 2 reps;20 seconds   Neck Stretch 2 reps;20 seconds  scalene   Corner Stretch 2 reps;20 seconds                PT Education - 03/20/16 1718    Education provided Yes   Education Details stretches and cervical retractions head nods   Person(s) Educated Patient   Methods Explanation;Handout;Tactile cues;Verbal cues   Comprehension Verbalized understanding          PT Short Term Goals - 03/20/16 1717      PT SHORT TERM GOAL #1   Title be independent in initial HEP   Time 4   Period Weeks   Status On-going     PT SHORT TERM GOAL #2   Title report 30% less HA throughout the day   Time 4   Period Weeks   Status On-going     PT SHORT TERM GOAL #3   Title able to read for 30 minutes without inducing a HA   Time 4   Period Weeks   Status On-going           PT Long Term Goals - 03/09/16 0759      PT LONG TERM GOAL #1   Title be independent in advanced HEP   Time 8   Period Weeks   Status New     PT LONG TERM GOAL #2   Title demonstrate cervical AROM within functional limits and without increased pain.   Time 8   Period Weeks   Status New     PT LONG TERM GOAL #3   Title report a 60% reduction in HA throughout the day   Time 8   Period Weeks   Status New     PT LONG TERM GOAL #4   Title demonstrate 4 to 4+/5 bilateral shoulder strength without increased pain   Time 8   Period Weeks   Status New     PT LONG TERM GOAL #5   Title .Marland KitchenMarland Kitchen               Plan - 03/20/16 1720    Clinical Impression Statement Pt reports decreased HA.  Able to  tolerate cervical exercises,  but needs a lot of cueing to keep upper traps relaxed throughout.  Responded well to manual STM. Very tight masseter and pterygoids, responded well to STM, was a little sore after manaul.  Cont to progress postural strength with manual and myofascial release techniques.   Rehab Potential Good   Clinical Impairments Affecting Rehab Potential fibromyalgia, cervical fusions, migrains   PT Frequency 2x / week   PT Duration 8 weeks   PT Treatment/Interventions ADLs/Self Care Home Management;Cryotherapy;Electrical Stimulation;Moist Heat;Traction;Functional mobility training;Therapeutic activities;Therapeutic exercise;Balance training;Neuromuscular re-education;Patient/family education;Manual techniques;Passive range of motion;Dry needling;Taping   PT Next Visit Plan manual, seated cervial retractions and head nods, scapular stability   PT Home Exercise Plan progress as needed   Consulted and Agree with Plan of Care Patient      Patient will benefit from skilled therapeutic intervention in order to improve the following deficits and impairments:  Decreased activity tolerance, Decreased coordination, Decreased endurance, Decreased mobility, Decreased range of motion, Decreased strength, Hypomobility, Increased fascial restricitons, Increased muscle spasms, Postural dysfunction, Pain  Visit Diagnosis: Tension-type headache, not intractable, unspecified chronicity pattern  Muscle weakness (generalized)  Abnormal posture     Problem List Patient Active Problem List   Diagnosis Date Noted  . Fibromyalgia 01/13/2016  . MGUS (monoclonal gammopathy of unknown significance) 12/23/2013  . Chronic cholecystitis without calculus 10/18/2012  . Abdominal pain, unspecified site 10/18/2012  . Nausea alone 10/18/2012  . Unspecified constipation 10/18/2012  . Depression 10/18/2012  . Bipolar 2 disorder ([place]) 10/18/2012  . Anxiety   . ASCUS (atypical squamous cells of  undetermined significance) on Pap smear   . IUD   . Hemorrhoids 11/29/2010  . Abdominal pain, left upper quadrant 11/29/2010    Zannie Cove, PT 03/20/2016, 5:24 PM  [facility] [address], [place], [place], [address] Phone: [phone]   Fax:  [phone]  Name: Danielle Bruce MRN: [id number] Date of Birth: 06-08-1960

## 2016-03-20 NOTE — Patient Instructions (Addendum)
Horizontal Abduction / Adduction    Straighten both arms in front of body at chest level. Pull arms out from midline. Then bring arms forward and together. Hand Variation: Thumbs up  Using yellow band - 20x Copyright  VHI. All rights reserved.  Ear / Shoulder Stretch    Exhaling, move left ear toward left shoulder. Hold position for _30 seconds__ breaths. Inhaling, bring head back to center. Repeat to other side. Repeat sequence _3 times. Do __1_ times per day.  Copyright  VHI. All rights reserved.  Levator Scapula Stretch, Sitting    Sit, one hand tucked under hip on side to be stretched, other hand over top of head. Turn head toward other side and look down. Use hand on head to gently stretch neck in that position. Hold 30_ seconds. Repeat _3__ times per session. Do _1__ sessions per day.  Copyright  VHI. All rights reserved.  Nod: Cervical Flexion    Nod head, tipping chin down. Tighten muscles in the back of throat. Do _20__ times, __1_ times per day.  http://ss.exer.us/191   Copyright  VHI. All rights reserved.  Neck: Retraction    Sit with back and head straight. Pull chin back to line up ear with shoulder. Do not turn or tilt head. May assist if child cannot keep correct position. Hold __5__ seconds. Repeat __10__ times. Do ___1_ sessions per day. CAUTION: Movement should be gentle, steady and slow.  Copyright  VHI. All rights reserved.

## 2016-03-22 DIAGNOSIS — F411 Generalized anxiety disorder: Secondary | ICD-10-CM | POA: Diagnosis not present

## 2016-03-22 DIAGNOSIS — K589 Irritable bowel syndrome without diarrhea: Secondary | ICD-10-CM | POA: Diagnosis not present

## 2016-03-22 DIAGNOSIS — F3181 Bipolar II disorder: Secondary | ICD-10-CM | POA: Diagnosis not present

## 2016-03-22 DIAGNOSIS — R400 Somnolence: Secondary | ICD-10-CM | POA: Diagnosis not present

## 2016-03-22 DIAGNOSIS — G43909 Migraine, unspecified, not intractable, without status migrainosus: Secondary | ICD-10-CM | POA: Diagnosis not present

## 2016-03-23 ENCOUNTER — Ambulatory Visit: Payer: Medicare Other | Admitting: Physical Therapy

## 2016-03-23 DIAGNOSIS — M6281 Muscle weakness (generalized): Secondary | ICD-10-CM | POA: Diagnosis not present

## 2016-03-23 DIAGNOSIS — R293 Abnormal posture: Secondary | ICD-10-CM | POA: Diagnosis not present

## 2016-03-23 DIAGNOSIS — G44209 Tension-type headache, unspecified, not intractable: Secondary | ICD-10-CM

## 2016-03-23 NOTE — Patient Instructions (Addendum)
     Trigger Point Dry Needling  . What is Trigger Point Dry Needling (DN)? o DN is a physical therapy technique used to treat muscle pain and dysfunction. Specifically, DN helps deactivate muscle trigger points (muscle knots).  o A thin filiform needle is used to penetrate the skin and stimulate the underlying trigger point. The goal is for a local twitch response (LTR) to occur and for the trigger point to relax. No medication of any kind is injected during the procedure.   . What Does Trigger Point Dry Needling Feel Like?  o The procedure feels different for each individual patient. Some patients report that they do not actually feel the needle enter the skin and overall the process is not painful. Very mild bleeding may occur. However, many patients feel a deep cramping in the muscle in which the needle was inserted. This is the local twitch response.   . How Will I feel after the treatment? o Soreness is normal, and the onset of soreness may not occur for a few hours. Typically this soreness does not last longer than two days.  o Bruising is uncommon, however; ice can be used to decrease any possible bruising.  o In rare cases feeling tired or nauseous after the treatment is normal. In addition, your symptoms may get worse before they get better, this period will typically not last longer than 24 hours.   . What Can I do After My Treatment? o Increase your hydration by drinking more water for the next 24 hours. o You may place ice or heat on the areas treated that have become sore, however, do not use heat on inflamed or bruised areas. Heat often brings more relief post needling. o You can continue your regular activities, but vigorous activity is not recommended initially after the treatment for 24 hours. o DN is best combined with other physical therapy such as strengthening, stretching, and other therapies.    Shaylen Nephew PT [facility] [address], [address] [place], [place] [address] Phone # [phone] Fax [phone] 

## 2016-03-23 NOTE — Therapy (Signed)
[facility] [address] [place], [place], [address] Phone: [phone]   Fax:  [phone]  Physical Therapy Treatment  Patient Details  Name: Danielle Bruce MRN: [id number] Date of Birth: [birth year] Referring Provider: Mayra Neer, MD  Encounter Date: 03/23/2016      PT End of Session - 03/23/16 1612    Visit Number 4   Number of Visits 10   Date for PT Re-Evaluation 05/02/16   Authorization Type Gcodes at 10th visit   PT Start Time 1531   PT Stop Time 1623   PT Time Calculation (min) 52 min   Activity Tolerance Patient tolerated treatment well      Past Medical History:  Diagnosis Date  . Anemia   . Anxiety   . Arthritis    osteoarthritis  . ASCUS (atypical squamous cells of undetermined significance) on Pap smear 05/06/05   NEG HIGH RISK HPV--C&B BIOPSY BENIGN 12/2005  . Asthma   . Bipolar 2 disorder ([place])   . Cancer ([place])    skin cancer - basal cell  . Colon polyps   . Complication of anesthesia    anxious afterwards, will get headaches   . Constipation   . Depression   . Fibromyalgia 10/2013  . GERD (gastroesophageal reflux disease)   . 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 Heart murmur    never had any problems  . Hemorrhoids   . High cholesterol   . 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   cytology negative  . IBS (irritable bowel syndrome)   . Insomnia   . Lymphocytic colitis   . 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 Migraines   . Osteoarthritis   . Osteopenia   . Peripheral neuropathy ([place])   . PTSD (post-traumatic stress disorder)     Past Surgical History:  Procedure Laterality Date  . BONE MARROW BIOPSY Left 12/18/2013   Plasma cell dyscrasia 8% population of plasma cells  . CESAREAN SECTION  [phone]  . CHOLECYSTECTOMY N/A 10/16/2012   Procedure: LAPAROSCOPIC CHOLECYSTECTOMY;  Surgeon: Joyice Faster. Cornett,  MD;  Location: [facility];  Service: General;  Laterality: N/A;  . COLONOSCOPY     numerous times  . DILATION AND CURETTAGE OF UTERUS    . ESOPHAGOGASTRODUODENOSCOPY    . [place]   x3  . IUD REMOVAL  02/2015   Mirena  . LAPAROSCOPIC LYSIS OF ADHESIONS N/A 10/16/2012   Procedure: LAPAROSCOPIC LYSIS OF ADHESIONS;  Surgeon: Joyice Faster. Cornett, MD;  [facility];  Service: General;  Laterality: N/A;  . LAPAROSCOPY N/A 10/16/2012   Procedure: LAPAROSCOPY DIAGNOSTIC;  Surgeon: Joyice Faster. Cornett, MD;  [facility];  Service: General;  Laterality: N/A;  . PELVIC LAPAROSCOPY    . RADIOLOGY WITH ANESTHESIA N/A 12/16/2015   Procedure: MRI OF BRAIN WITH AND WITHOUT CONTRAST;  Surgeon: Medication Radiologist, MD;  Location: [facility];  Service: Radiology;  Laterality: N/A;  . SHOULDER SURGERY  2007/2008  . SPINE SURGERY  2010   cervical    There were no vitals filed for this visit.      Subjective Assessment - 03/23/16 1533    Subjective Headaches are decreasing in intensity and frequency.  No longer having daily headaches as she was previously.  The doctor wants me to try dry needling but I don't like needles.     Currently in Pain? Yes  Pain Score 2    Pain Location Head   Pain Orientation Right;Left   Pain Frequency Intermittent                         [facility] Adult PT Treatment/Exercise - 03/23/16 0001      Neck Exercises: Supine   Neck Retraction 10 reps;5 secs   Capital Flexion 20 reps     Moist Heat Therapy   Number Minutes Moist Heat 15 Minutes   Moist Heat Location Cervical     Electrical Stimulation   Electrical Stimulation Location cervical    Electrical Stimulation Action IFC   Electrical Stimulation Parameters 7 ma 15 min supine   Electrical Stimulation Goals Pain     Manual Therapy   Soft tissue mobilization suboccipitals, upper traps, levator scap, cervical paraspinals          Trigger Point Dry Needling - 03/23/16 1611     Consent Given? Yes   Education Handout Provided Yes   Muscles Treated Upper Body Upper trapezius;Suboccipitals muscle group;Levator scapulae  bilateral cervical multifidi   Upper Trapezius Response Twitch reponse elicited;Palpable increased muscle length   SubOccipitals Response Twitch response elicited;Palpable increased muscle length   Levator Scapulae Response Twitch response elicited;Palpable increased muscle length       Performed bilaterally       PT Education - 03/23/16 1609    Education provided Yes   Education Details dry needling after care;  review of HEP to optimize dry needling response;  TENS info   Person(s) Educated Patient   Methods Explanation;Demonstration;Handout   Comprehension Verbalized understanding;Returned demonstration          PT Short Term Goals - 03/23/16 1627      PT SHORT TERM GOAL #1   Title be independent in initial HEP   Time 4   Period Weeks   Status On-going     PT SHORT TERM GOAL #2   Title report 30% less HA throughout the day   Time 4   Period Weeks   Status On-going     PT SHORT TERM GOAL #3   Title able to read for 30 minutes without inducing a HA   Time 4   Period Weeks   Status On-going           PT Long Term Goals - 03/23/16 1628      PT LONG TERM GOAL #1   Title be independent in advanced HEP   Time 8   Period Weeks   Status On-going     PT LONG TERM GOAL #2   Title demonstrate cervical AROM within functional limits and without increased pain.   Time 8   Period Weeks   Status On-going     PT LONG TERM GOAL #3   Title report a 60% reduction in HA throughout the day   Time 8   Period Weeks   Status On-going               Plan - 03/23/16 1613    Clinical Impression Statement The patient reports improvment in headaches with ex and manual therapy but states she has been highly encouraged to try dry needling by her doctor.  She is fearful of needles but would like to try today.  She was quite  sensitive and reactive to needling and manual therapy perhaps worsened by her anxiety.  Good pain relief from heat and e-stim.  Therapist closely monitoring response with all  interventions.     PT Next Visit Plan assess response to DN #1;  continue manual; ex including retractions and nods, postural strengthening (possibly add yellow band scapular ex);  e-stim/heat as needed;  patient given info on home TENS unit      Patient will benefit from skilled therapeutic intervention in order to improve the following deficits and impairments:     Visit Diagnosis: Tension-type headache, not intractable, unspecified chronicity pattern  Muscle weakness (generalized)  Abnormal posture     Problem List Patient Active Problem List   Diagnosis Date Noted  . Fibromyalgia 01/13/2016  . MGUS (monoclonal gammopathy of unknown significance) 12/23/2013  . Chronic cholecystitis without calculus 10/18/2012  . Abdominal pain, unspecified site 10/18/2012  . Nausea alone 10/18/2012  . Unspecified constipation 10/18/2012  . Depression 10/18/2012  . Bipolar 2 disorder ([place]) 10/18/2012  . Anxiety   . ASCUS (atypical squamous cells of undetermined significance) on Pap smear   . IUD   . Hemorrhoids 11/29/2010  . Abdominal pain, left upper quadrant 11/29/2010    Ruben Im, PT 03/23/16 4:30 PM Phone: [phone] Fax: [phone] Alvera Singh 03/23/2016, 4:29 PM  [facility] [address], [place], [place], [address] Phone: [phone]   Fax:  [phone]  Name: Danielle Bruce MRN: [id number] Date of Birth: 12-Jan-1961

## 2016-03-27 ENCOUNTER — Encounter: Payer: Self-pay | Admitting: Physical Therapy

## 2016-03-27 ENCOUNTER — Ambulatory Visit: Payer: Medicare Other | Admitting: Physical Therapy

## 2016-03-27 DIAGNOSIS — R293 Abnormal posture: Secondary | ICD-10-CM

## 2016-03-27 DIAGNOSIS — G44209 Tension-type headache, unspecified, not intractable: Secondary | ICD-10-CM

## 2016-03-27 DIAGNOSIS — M6281 Muscle weakness (generalized): Secondary | ICD-10-CM

## 2016-03-27 DIAGNOSIS — F3113 Bipolar disorder, current episode manic without psychotic features, severe: Secondary | ICD-10-CM | POA: Diagnosis not present

## 2016-03-27 NOTE — Therapy (Signed)
[facility] [address], [place], [place], [address] Phone: [phone]   Fax:  [phone]  Physical Therapy Treatment  Patient Details  Name: Danielle Bruce MRN: [id number] Date of Birth: [birth year] Referring Provider: Mayra Neer, MD  Encounter Date: 03/27/2016      PT End of Session - 03/27/16 1609    Visit Number 5   Number of Visits 10   Date for PT Re-Evaluation 05/02/16   Authorization Type Gcodes at 10th visit   PT Start Time 1531   PT Stop Time 1620   PT Time Calculation (min) 49 min   Activity Tolerance Patient tolerated treatment well   Behavior During Therapy [facility] for tasks assessed/performed      Past Medical History:  Diagnosis Date  . Anemia   . Anxiety   . Arthritis    osteoarthritis  . ASCUS (atypical squamous cells of undetermined significance) on Pap smear 05/06/05   NEG HIGH RISK HPV--C&B BIOPSY BENIGN 12/2005  . Asthma   . Bipolar 2 disorder ([place])   . Cancer ([place])    skin cancer - basal cell  . Colon polyps   . Complication of anesthesia    anxious afterwards, will get headaches   . Constipation   . Depression   . Fibromyalgia 10/2013  . GERD (gastroesophageal reflux disease)   . 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 Heart murmur    never had any problems  . Hemorrhoids   . High cholesterol   . 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   cytology negative  . IBS (irritable bowel syndrome)   . Insomnia   . Lymphocytic colitis   . 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 Migraines   . Osteoarthritis   . Osteopenia   . Peripheral neuropathy ([place])   . PTSD (post-traumatic stress disorder)     Past Surgical History:  Procedure Laterality Date  . BONE MARROW BIOPSY Left 12/18/2013   Plasma cell dyscrasia 8% population of plasma cells  . CESAREAN SECTION  [phone]  . CHOLECYSTECTOMY N/A 10/16/2012   Procedure: LAPAROSCOPIC CHOLECYSTECTOMY;  Surgeon: Joyice Faster. Cornett, MD;  [facility];  Service: General;  Laterality: N/A;  . COLONOSCOPY     numerous times  . DILATION AND CURETTAGE OF UTERUS    . ESOPHAGOGASTRODUODENOSCOPY    . [place]   x3  . IUD REMOVAL  02/2015   Mirena  . LAPAROSCOPIC LYSIS OF ADHESIONS N/A 10/16/2012   Procedure: LAPAROSCOPIC LYSIS OF ADHESIONS;  Surgeon: Joyice Faster. Cornett, MD;  [facility];  Service: General;  Laterality: N/A;  . LAPAROSCOPY N/A 10/16/2012   Procedure: LAPAROSCOPY DIAGNOSTIC;  Surgeon: Joyice Faster. Cornett, MD;  [facility];  Service: General;  Laterality: N/A;  . PELVIC LAPAROSCOPY    . RADIOLOGY WITH ANESTHESIA N/A 12/16/2015   Procedure: MRI OF BRAIN WITH AND WITHOUT CONTRAST;  Surgeon: Medication Radiologist, MD;  Location: [facility];  Service: Radiology;  Laterality: N/A;  . SHOULDER SURGERY  2007/2008  . SPINE SURGERY  2010   cervical    There were no vitals filed for this visit.      Subjective Assessment - 03/27/16 1616    Subjective Had a migrain this weekend so unable to do anything.  States she had a lot of pain with DN and would not like to do that again.  States she  felt better with jaw soft tissue mobs at previous visit.   Pertinent History fibromyalgia, cervical fusion, migrains.  Pt goes by "Danielle Bruce"   Limitations Reading;Other (comment)   Pain Score 2    Pain Location Head   Pain Orientation Right;Left   Pain Descriptors / Indicators Constant;Throbbing   Pain Type Chronic pain   Pain Onset More than a month ago   Pain Frequency Intermittent   Multiple Pain Sites No                         [facility] Adult PT Treatment/Exercise - 03/27/16 0001      Neck Exercises: Supine   Shoulder ABduction Both;20 reps  yellow band; horizontal abduction, diagonal both ways     Moist Heat Therapy   Number Minutes Moist Heat 15 Minutes   Moist Heat Location Cervical     Electrical  Stimulation   Electrical Stimulation Location cervical    Electrical Stimulation Action IFC   Electrical Stimulation Parameters 15 minutes to tolerance   Electrical Stimulation Goals Pain     Manual Therapy   Manual Therapy Myofascial release;Soft tissue mobilization   Soft tissue mobilization cervical paraspinals, suboccipitals   Myofascial Release cervical muscles                  PT Short Term Goals - 03/23/16 1627      PT SHORT TERM GOAL #1   Title be independent in initial HEP   Time 4   Period Weeks   Status On-going     PT SHORT TERM GOAL #2   Title report 30% less HA throughout the day   Time 4   Period Weeks   Status On-going     PT SHORT TERM GOAL #3   Title able to read for 30 minutes without inducing a HA   Time 4   Period Weeks   Status On-going           PT Long Term Goals - 03/27/16 1615      PT LONG TERM GOAL #1   Title be independent in advanced HEP   Time 8   Period Weeks   Status On-going     PT LONG TERM GOAL #2   Title demonstrate cervical AROM within functional limits and without increased pain.   Time 8   Period Weeks   Status On-going     PT LONG TERM GOAL #3   Title report a 60% reduction in HA throughout the day   Time 8   Period Weeks   Status On-going               Plan - 03/27/16 1531    Clinical Impression Statement Pt continues to have tension in cervical region.  Pt does well with exercises in supine but still stiff and guarded when sitting and standing.  will benefit from skilled PT to work on muscle length along with postural strength and endurance for imiproved cervical stability during functional movements.   Rehab Potential Good   Clinical Impairments Affecting Rehab Potential fibromyalgia, cervical fusions, migrains   PT Frequency 2x / week   PT Duration 8 weeks   PT Treatment/Interventions ADLs/Self Care Home Management;Cryotherapy;Electrical Stimulation;Moist Heat;Traction;Functional mobility  training;Therapeutic activities;Therapeutic exercise;Balance training;Neuromuscular re-education;Patient/family education;Manual techniques;Passive range of motion;Dry needling;Taping   PT Next Visit Plan continue postural strengthen, progress to standing exericses with band as tolerated, stim and manual as needed   PT Home Exercise Plan  progress as needed   Consulted and Agree with Plan of Care Patient      Patient will benefit from skilled therapeutic intervention in order to improve the following deficits and impairments:  Decreased activity tolerance, Decreased coordination, Decreased endurance, Decreased mobility, Decreased range of motion, Decreased strength, Hypomobility, Increased fascial restricitons, Increased muscle spasms, Postural dysfunction, Pain  Visit Diagnosis: Tension-type headache, not intractable, unspecified chronicity pattern  Muscle weakness (generalized)  Abnormal posture     Problem List Patient Active Problem List   Diagnosis Date Noted  . Fibromyalgia 01/13/2016  . MGUS (monoclonal gammopathy of unknown significance) 12/23/2013  . Chronic cholecystitis without calculus 10/18/2012  . Abdominal pain, unspecified site 10/18/2012  . Nausea alone 10/18/2012  . Unspecified constipation 10/18/2012  . Depression 10/18/2012  . Bipolar 2 disorder ([place]) 10/18/2012  . Anxiety   . ASCUS (atypical squamous cells of undetermined significance) on Pap smear   . IUD   . Hemorrhoids 11/29/2010  . Abdominal pain, left upper quadrant 11/29/2010    Zannie Cove, PT 03/27/2016, 5:28 PM  [facility] [address], [place], [place], [address] Phone: [phone]   Fax:  [phone]  Name: Danielle Bruce MRN: [id number] Date of Birth: 06/25/1960

## 2016-03-30 ENCOUNTER — Ambulatory Visit: Payer: Medicare Other | Admitting: Physical Therapy

## 2016-04-03 ENCOUNTER — Ambulatory Visit: Payer: Medicare Other | Attending: Family Medicine | Admitting: Physical Therapy

## 2016-04-03 DIAGNOSIS — M25521 Pain in right elbow: Secondary | ICD-10-CM | POA: Diagnosis not present

## 2016-04-03 DIAGNOSIS — G44209 Tension-type headache, unspecified, not intractable: Secondary | ICD-10-CM | POA: Insufficient documentation

## 2016-04-03 DIAGNOSIS — R29898 Other symptoms and signs involving the musculoskeletal system: Secondary | ICD-10-CM | POA: Insufficient documentation

## 2016-04-03 DIAGNOSIS — M6281 Muscle weakness (generalized): Secondary | ICD-10-CM | POA: Diagnosis not present

## 2016-04-03 DIAGNOSIS — R293 Abnormal posture: Secondary | ICD-10-CM | POA: Diagnosis not present

## 2016-04-03 NOTE — Therapy (Signed)
[facility] [address] [place], [place], [address] Phone: [phone]   Fax:  [phone]  Physical Therapy Treatment  Patient Details  Name: Danielle Bruce MRN: [id number] Date of Birth: February 01, 1961 Referring Provider: Mayra Neer, MD  Encounter Date: 04/03/2016      PT End of Session - 04/03/16 1632    Visit Number 6   Number of Visits 10   Date for PT Re-Evaluation 05/02/16   Authorization Type Gcodes at 10th visit   PT Start Time 1539   PT Stop Time 1623   PT Time Calculation (min) 44 min   Activity Tolerance Patient tolerated treatment well   Behavior During Therapy [facility] for tasks assessed/performed      Past Medical History:  Diagnosis Date  . Anemia   . Anxiety   . Arthritis    osteoarthritis  . ASCUS (atypical squamous cells of undetermined significance) on Pap smear 05/06/05   NEG HIGH RISK HPV--C&B BIOPSY BENIGN 12/2005  . Asthma   . Bipolar 2 disorder ([place])   . Cancer ([place])    skin cancer - basal cell  . Colon polyps   . Complication of anesthesia    anxious afterwards, will get headaches   . Constipation   . Depression   . Fibromyalgia 10/2013  . GERD (gastroesophageal reflux disease)   . 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 Heart murmur    never had any problems  . Hemorrhoids   . High cholesterol   . 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   cytology negative  . IBS (irritable bowel syndrome)   . Insomnia   . Lymphocytic colitis   . 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 Migraines   . Osteoarthritis   . Osteopenia   . Peripheral neuropathy ([place])   . PTSD (post-traumatic stress disorder)     Past Surgical History:  Procedure Laterality Date  . BONE MARROW BIOPSY Left 12/18/2013   Plasma cell dyscrasia 8% population of plasma cells  . CESAREAN SECTION  [phone]  . CHOLECYSTECTOMY N/A 10/16/2012   Procedure:  LAPAROSCOPIC CHOLECYSTECTOMY;  Surgeon: Joyice Faster. Cornett, MD;  [facility];  Service: General;  Laterality: N/A;  . COLONOSCOPY     numerous times  . DILATION AND CURETTAGE OF UTERUS    . ESOPHAGOGASTRODUODENOSCOPY    . [place]   x3  . IUD REMOVAL  02/2015   Mirena  . LAPAROSCOPIC LYSIS OF ADHESIONS N/A 10/16/2012   Procedure: LAPAROSCOPIC LYSIS OF ADHESIONS;  Surgeon: Joyice Faster. Cornett, MD;  [facility];  Service: General;  Laterality: N/A;  . LAPAROSCOPY N/A 10/16/2012   Procedure: LAPAROSCOPY DIAGNOSTIC;  Surgeon: Joyice Faster. Cornett, MD;  [facility];  Service: General;  Laterality: N/A;  . PELVIC LAPAROSCOPY    . RADIOLOGY WITH ANESTHESIA N/A 12/16/2015   Procedure: MRI OF BRAIN WITH AND WITHOUT CONTRAST;  Surgeon: Medication Radiologist, MD;  Location: [facility];  Service: Radiology;  Laterality: N/A;  . SHOULDER SURGERY  2007/2008  . SPINE SURGERY  2010   cervical    There were no vitals filed for this visit.      Subjective Assessment - 04/03/16 1630    Subjective Having a HA currently, states has been stressful day and it is not a migrain but a tension HA   Pertinent History fibromyalgia, cervical fusion, migrains.  Pt goes  by "[name]"   Currently in Pain? Yes   Pain Score 4    Pain Location Head   Pain Orientation Right;Left;Posterior   Pain Descriptors / Indicators Constant;Throbbing   Pain Type Chronic pain   Pain Radiating Towards up back of head   Pain Onset More than a month ago   Pain Frequency Intermittent   Aggravating Factors  stress   Pain Relieving Factors heat   Effect of Pain on Daily Activities can't do anything   Multiple Pain Sites No                         [facility] Adult PT Treatment/Exercise - 04/03/16 0001      Manual Therapy   Soft tissue mobilization masseter and pterygoids, cervical paraspinals, suboccipitals, subclavius   Myofascial Release cervical muscles                  PT Short  Term Goals - 04/03/16 1633      PT SHORT TERM GOAL #1   Title be independent in initial HEP   Time 4   Period Weeks   Status Achieved     PT SHORT TERM GOAL #2   Title report 30% less HA throughout the day   Time 4   Period Weeks   Status On-going           PT Long Term Goals - 04/03/16 1634      PT LONG TERM GOAL #2   Title demonstrate cervical AROM within functional limits and without increased pain.   Time 8   Period Weeks   Status On-going               Plan - 04/03/16 1634    Clinical Impression Statement Pt unable to progress strength due to increased muscle tension.  Pt Had greater tension at Rt SCM, cervical paraspinals, suboccipitals, and masseter.  Had more tension on Lt scalenes and subclavius. Pt continues to benefit from skilled therapy for reduced symptoms and strengthening for improvred posture during activities.   Rehab Potential Good   Clinical Impairments Affecting Rehab Potential fibromyalgia, cervical fusions, migrains   PT Frequency 2x / week   PT Duration 8 weeks   PT Treatment/Interventions ADLs/Self Care Home Management;Cryotherapy;Electrical Stimulation;Moist Heat;Traction;Functional mobility training;Therapeutic activities;Therapeutic exercise;Balance training;Neuromuscular re-education;Patient/family education;Manual techniques;Passive range of motion;Dry needling;Taping   PT Next Visit Plan continue postural strengthen, progress to standing exericses with band as tolerated, stim and manual as needed   PT Home Exercise Plan progress as needed   Consulted and Agree with Plan of Care Patient      Patient will benefit from skilled therapeutic intervention in order to improve the following deficits and impairments:  Decreased activity tolerance, Decreased coordination, Decreased endurance, Decreased mobility, Decreased range of motion, Decreased strength, Hypomobility, Increased fascial restricitons, Increased muscle spasms, Postural  dysfunction, Pain  Visit Diagnosis: Tension-type headache, not intractable, unspecified chronicity pattern  Muscle weakness (generalized)  Abnormal posture     Problem List Patient Active Problem List   Diagnosis Date Noted  . Fibromyalgia 01/13/2016  . MGUS (monoclonal gammopathy of unknown significance) 12/23/2013  . Chronic cholecystitis without calculus 10/18/2012  . Abdominal pain, unspecified site 10/18/2012  . Nausea alone 10/18/2012  . Unspecified constipation 10/18/2012  . Depression 10/18/2012  . Bipolar 2 disorder ([place]) 10/18/2012  . Anxiety   . ASCUS (atypical squamous cells of undetermined significance) on Pap smear   . IUD   . Hemorrhoids  11/29/2010  . Abdominal pain, left upper quadrant 11/29/2010    Zannie Cove, PT 04/03/2016, 4:45 PM  [facility] [address] [place], [place], [address] Phone: [phone]   Fax:  [phone]  Name: Danielle Bruce MRN: [id number] Date of Birth: [birth year]

## 2016-04-04 ENCOUNTER — Ambulatory Visit
Admission: RE | Admit: 2016-04-04 | Discharge: 2016-04-04 | Disposition: A | Discharge status: Home / Self Care | Payer: Medicare Other | Source: Ambulatory Visit | Attending: Gynecology | Admitting: Gynecology

## 2016-04-04 DIAGNOSIS — Z1231 Encounter for screening mammogram for malignant neoplasm of breast: Secondary | ICD-10-CM

## 2016-04-06 ENCOUNTER — Ambulatory Visit: Payer: Medicare Other | Admitting: Physical Therapy

## 2016-04-06 ENCOUNTER — Encounter: Payer: Self-pay | Admitting: Physical Therapy

## 2016-04-06 DIAGNOSIS — R29898 Other symptoms and signs involving the musculoskeletal system: Secondary | ICD-10-CM

## 2016-04-06 DIAGNOSIS — R293 Abnormal posture: Secondary | ICD-10-CM | POA: Diagnosis not present

## 2016-04-06 DIAGNOSIS — J3081 Allergic rhinitis due to animal (cat) (dog) hair and dander: Secondary | ICD-10-CM | POA: Diagnosis not present

## 2016-04-06 DIAGNOSIS — M6281 Muscle weakness (generalized): Secondary | ICD-10-CM | POA: Diagnosis not present

## 2016-04-06 DIAGNOSIS — G44209 Tension-type headache, unspecified, not intractable: Secondary | ICD-10-CM

## 2016-04-06 DIAGNOSIS — M25521 Pain in right elbow: Secondary | ICD-10-CM | POA: Diagnosis not present

## 2016-04-06 DIAGNOSIS — J3089 Other allergic rhinitis: Secondary | ICD-10-CM | POA: Diagnosis not present

## 2016-04-06 DIAGNOSIS — J301 Allergic rhinitis due to pollen: Secondary | ICD-10-CM | POA: Diagnosis not present

## 2016-04-06 NOTE — Therapy (Signed)
[facility] [address], [place], [place], [address] Phone: [phone]   Fax:  [phone]  Physical Therapy Treatment  Patient Details  Name: Danielle Bruce MRN: [id number] Date of Birth: 10-28-1960 Referring Provider: Mayra Neer, MD  Encounter Date: 04/06/2016      PT End of Session - 04/06/16 1233    Visit Number 7   Number of Visits 10   Date for PT Re-Evaluation 05/02/16   Authorization Type Gcodes at 10th visit   PT Start Time 1234   PT Stop Time 1318   PT Time Calculation (min) 44 min   Activity Tolerance Patient tolerated treatment well   Behavior During Therapy [facility] for tasks assessed/performed      Past Medical History:  Diagnosis Date  . Anemia   . Anxiety   . Arthritis    osteoarthritis  . ASCUS (atypical squamous cells of undetermined significance) on Pap smear 05/06/05   NEG HIGH RISK HPV--C&B BIOPSY BENIGN 12/2005  . Asthma   . Bipolar 2 disorder ([place])   . Cancer ([place])    skin cancer - basal cell  . Colon polyps   . Complication of anesthesia    anxious afterwards, will get headaches   . Constipation   . Depression   . Fibromyalgia 10/2013  . GERD (gastroesophageal reflux disease)   . 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 Heart murmur    never had any problems  . Hemorrhoids   . High cholesterol   . 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   cytology negative  . IBS (irritable bowel syndrome)   . Insomnia   . Lymphocytic colitis   . 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 Migraines   . Osteoarthritis   . Osteopenia   . Peripheral neuropathy ([place])   . PTSD (post-traumatic stress disorder)     Past Surgical History:  Procedure Laterality Date  . BONE MARROW BIOPSY Left 12/18/2013   Plasma cell dyscrasia 8% population of plasma cells  . BREAST BIOPSY Right    benign stereo  . CESAREAN SECTION  [phone]  .  CHOLECYSTECTOMY N/A 10/16/2012   Procedure: LAPAROSCOPIC CHOLECYSTECTOMY;  Surgeon: Joyice Faster. Cornett, MD;  [facility];  Service: General;  Laterality: N/A;  . COLONOSCOPY     numerous times  . DILATION AND CURETTAGE OF UTERUS    . ESOPHAGOGASTRODUODENOSCOPY    . [place]   x3  . IUD REMOVAL  02/2015   Mirena  . LAPAROSCOPIC LYSIS OF ADHESIONS N/A 10/16/2012   Procedure: LAPAROSCOPIC LYSIS OF ADHESIONS;  Surgeon: Joyice Faster. Cornett, MD;  [facility];  Service: General;  Laterality: N/A;  . LAPAROSCOPY N/A 10/16/2012   Procedure: LAPAROSCOPY DIAGNOSTIC;  Surgeon: Joyice Faster. Cornett, MD;  [facility];  Service: General;  Laterality: N/A;  . PELVIC LAPAROSCOPY    . RADIOLOGY WITH ANESTHESIA N/A 12/16/2015   Procedure: MRI OF BRAIN WITH AND WITHOUT CONTRAST;  Surgeon: Medication Radiologist, MD;  Location: [place];  Service: Radiology;  Laterality: N/A;  . SHOULDER SURGERY  2007/2008  . SPINE SURGERY  2010   cervical    There were no vitals filed for this visit.      Subjective Assessment - 04/06/16 1237    Subjective Pt states no HA today.  States doing exericises can relieve symptoms sometimes but is hard to get herself to  do them.   Pertinent History fibromyalgia, cervical fusion, migrains.  Pt goes by "Danielle Bruce"   Limitations Reading;Other (comment)   Patient Stated Goals Be able to do things like go to son's going away party   Currently in Pain? No/denies                         [facility] Adult PT Treatment/Exercise - 04/06/16 0001      Neck Exercises: Theraband   Shoulder Extension 20 reps  yellow band   Rows 20 reps  yellow   Horizontal ABduction 20 reps  yellow supine   Other Theraband Exercises D2 both ways in supine - yellow band     Neck Exercises: Standing   Wall Push Ups 20 reps   UE D2 Weights (lbs) supine with yellow band     Shoulder Exercises: Standing   Extension Strengthening;20 reps;Theraband   Theraband Level  (Shoulder Extension) Level 1 (Yellow)   Row Strengthening;20 reps;Theraband   Theraband Level (Shoulder Row) Level 1 (Yellow)     Manual Therapy   Soft tissue mobilization cervical paraspinals, suboccipitals   Myofascial Release cervical muscles     Neck Exercises: Stretches   Upper Trapezius Stretch 2 reps;20 seconds   Other Neck Stretches SCM stretch 3x 20sec                PT Education - 04/06/16 1320    Education provided Yes   Education Details HEP as shown below   [employer]) Educated Patient   Methods Explanation;Demonstration;Tactile cues;Verbal cues;Handout   Comprehension Verbalized understanding;Returned demonstration          PT Short Term Goals - 04/03/16 1633      PT SHORT TERM GOAL #1   Title be independent in initial HEP   Time 4   Period Weeks   Status Achieved     PT SHORT TERM GOAL #2   Title report 30% less HA throughout the day   Time 4   Period Weeks   Status On-going           PT Long Term Goals - 04/03/16 1634      PT LONG TERM GOAL #2   Title demonstrate cervical AROM within functional limits and without increased pain.   Time 8   Period Weeks   Status On-going               Plan - 04/06/16 1440    Clinical Impression Statement Pt able to add to HEP and progressed with band exercises for posture.  Contiues to have increased muscle spasms on Rt> Lt cervical muscles.  Skilled PT for postural strength and reduction of tension HA.     Rehab Potential Good   Clinical Impairments Affecting Rehab Potential fibromyalgia, cervical fusions, migrains   PT Frequency 2x / week   PT Duration 8 weeks   PT Treatment/Interventions ADLs/Self Care Home Management;Cryotherapy;Electrical Stimulation;Moist Heat;Traction;Functional mobility training;Therapeutic activities;Therapeutic exercise;Balance training;Neuromuscular re-education;Patient/family education;Manual techniques;Passive range of motion;Dry needling;Taping   PT Next Visit Plan  continue postural strengthen, progress to standing exericses with band as tolerated, stim and manual as needed   PT Home Exercise Plan progress as needed   Consulted and Agree with Plan of Care Patient      Patient will benefit from skilled therapeutic intervention in order to improve the following deficits and impairments:  Decreased activity tolerance, Decreased coordination, Decreased endurance, Decreased mobility, Decreased range of motion, Decreased strength, Hypomobility, Increased fascial restricitons,  Increased muscle spasms, Postural dysfunction, Pain  Visit Diagnosis: Tension-type headache, not intractable, unspecified chronicity pattern  Muscle weakness (generalized)  Abnormal posture  Elbow pain, right  Weakness of wrist     Problem List Patient Active Problem List   Diagnosis Date Noted  . Fibromyalgia 01/13/2016  . MGUS (monoclonal gammopathy of unknown significance) 12/23/2013  . Chronic cholecystitis without calculus 10/18/2012  . Abdominal pain, unspecified site 10/18/2012  . Nausea alone 10/18/2012  . Unspecified constipation 10/18/2012  . Depression 10/18/2012  . Bipolar 2 disorder ([place]) 10/18/2012  . Anxiety   . ASCUS (atypical squamous cells of undetermined significance) on Pap smear   . IUD   . Hemorrhoids 11/29/2010  . Abdominal pain, left upper quadrant 11/29/2010    Zannie Cove , PT 04/06/2016, 2:43 PM  [facility] [address] [place], [place], [address] Phone: [phone]   Fax:  [phone]  Name: EVA GRIFFO MRN: [id number] Date of Birth: 1960-09-07

## 2016-04-06 NOTE — Patient Instructions (Signed)
(  Clinic) Extension / Flexion (Assist)    Face pulley, right arm as far forward and up as is pain free. Pull arm down toward side.Keep arms straight Repeat __30__ times per set. Do ___1_ sets per session. Yellow band   Copyright  VHI. All rights reserved.  (Clinic) Retraction: Row - Bilateral ([place]ul[place])    Facing pulley, arms reaching forward, pull hands toward stomach, pinching shoulder blades together. Elbows bent Repeat ___30_ times per set. Do __1__ sets per session. Yellow Use ____ lb weights.  Copyright  VHI. All rights reserved.   Closed Chain: Wall Push-Off    Stand away from wall. Fall toward wall, absorbing impact with arms. Keep elbows out or close to side.   push back to start. Repeat __20_ times per set.  http://plyo.exer.us/176   Copyright  VHI. All rights reserved.    Horizontal Abduction / Adduction    Straighten both arms in front of body at chest level. Pull arms out from midline, do while holding yellow band for resistance. Then bring arms forward and together. 20x Hand Variation: Thumbs up  .  Copyright  VHI. All rights reserved.

## 2016-04-10 ENCOUNTER — Encounter: Payer: Self-pay | Admitting: Physical Therapy

## 2016-04-10 ENCOUNTER — Ambulatory Visit: Payer: Medicare Other | Admitting: Physical Therapy

## 2016-04-10 DIAGNOSIS — M25521 Pain in right elbow: Secondary | ICD-10-CM | POA: Diagnosis not present

## 2016-04-10 DIAGNOSIS — R29898 Other symptoms and signs involving the musculoskeletal system: Secondary | ICD-10-CM | POA: Diagnosis not present

## 2016-04-10 DIAGNOSIS — G44209 Tension-type headache, unspecified, not intractable: Secondary | ICD-10-CM

## 2016-04-10 DIAGNOSIS — M6281 Muscle weakness (generalized): Secondary | ICD-10-CM | POA: Diagnosis not present

## 2016-04-10 DIAGNOSIS — R293 Abnormal posture: Secondary | ICD-10-CM | POA: Diagnosis not present

## 2016-04-10 NOTE — Therapy (Signed)
[facility] [address] [place], [place], [address] Phone: [phone]   Fax:  [phone]  Physical Therapy Treatment  Patient Details  Name: Danielle Bruce MRN: [id number] Date of Birth: 1960/11/25 Referring Provider: Mayra Neer, MD  Encounter Date: 04/10/2016      PT End of Session - 04/10/16 1626    Visit Number 8   Number of Visits 10   Date for PT Re-Evaluation 05/02/16   Authorization Type Gcodes at 10th visit   PT Start Time 1532   PT Stop Time 1621   PT Time Calculation (min) 49 min   Activity Tolerance Patient tolerated treatment well   Behavior During Therapy [facility] for tasks assessed/performed      Past Medical History:  Diagnosis Date  . Anemia   . Anxiety   . Arthritis    osteoarthritis  . ASCUS (atypical squamous cells of undetermined significance) on Pap smear 05/06/05   NEG HIGH RISK HPV--C&B BIOPSY BENIGN 12/2005  . Asthma   . Bipolar 2 disorder ([place])   . Cancer ([place])    skin cancer - basal cell  . Colon polyps   . Complication of anesthesia    anxious afterwards, will get headaches   . Constipation   . Depression   . Fibromyalgia 10/2013  . GERD (gastroesophageal reflux disease)   . 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 Heart murmur    never had any problems  . Hemorrhoids   . High cholesterol   . 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   cytology negative  . IBS (irritable bowel syndrome)   . Insomnia   . Lymphocytic colitis   . 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 Migraines   . Osteoarthritis   . Osteopenia   . Peripheral neuropathy ([place])   . PTSD (post-traumatic stress disorder)     Past Surgical History:  Procedure Laterality Date  . BONE MARROW BIOPSY Left 12/18/2013   Plasma cell dyscrasia 8% population of plasma cells  . BREAST BIOPSY Right    benign stereo  . CESAREAN SECTION  [phone]  .  CHOLECYSTECTOMY N/A 10/16/2012   Procedure: LAPAROSCOPIC CHOLECYSTECTOMY;  Surgeon: Joyice Faster. Cornett, MD;  [facility];  Service: General;  Laterality: N/A;  . COLONOSCOPY     numerous times  . DILATION AND CURETTAGE OF UTERUS    . ESOPHAGOGASTRODUODENOSCOPY    . [place]   x3  . IUD REMOVAL  02/2015   Mirena  . LAPAROSCOPIC LYSIS OF ADHESIONS N/A 10/16/2012   Procedure: LAPAROSCOPIC LYSIS OF ADHESIONS;  Surgeon: Joyice Faster. Cornett, MD;  [facility];  Service: General;  Laterality: N/A;  . LAPAROSCOPY N/A 10/16/2012   Procedure: LAPAROSCOPY DIAGNOSTIC;  Surgeon: Joyice Faster. Cornett, MD;  [facility];  Service: General;  Laterality: N/A;  . PELVIC LAPAROSCOPY    . RADIOLOGY WITH ANESTHESIA N/A 12/16/2015   Procedure: MRI OF BRAIN WITH AND WITHOUT CONTRAST;  Surgeon: Medication Radiologist, MD;  Location: [place];  Service: Radiology;  Laterality: N/A;  . SHOULDER SURGERY  2007/2008  . SPINE SURGERY  2010   cervical    There were no vitals filed for this visit.      Subjective Assessment - 04/10/16 1624    Subjective Pt reports no HA today and states she hasn't had a migrain in the past two weeks which is a  huge improvement.  States she has had at least 2 migrains per week for a long time.   Pertinent History fibromyalgia, cervical fusion, migrains.  Pt goes by "[name]"   Limitations Reading;Other (comment)   Patient Stated Goals Be able to do things like go to son's going away party   Currently in Pain? No/denies                         [facility] Adult PT Treatment/Exercise - 04/10/16 0001      Neck Exercises: Theraband   Shoulder Extension 20 reps;Red   Rows 20 reps;Red   Horizontal ABduction 20 reps  yellow   Other Theraband Exercises D2 both way - yellow band     Neck Exercises: Standing   Wall Push Ups 20 reps     Shoulder Exercises: Standing   Other Standing Exercises I's, Y's, T's in standing - 2lb - 2x10 each     Manual  Therapy   Soft tissue mobilization pecs, cervical paraspinals, suboccipitals   Myofascial Release cervical muscles                  PT Short Term Goals - 04/03/16 1633      PT SHORT TERM GOAL #1   Title be independent in initial HEP   Time 4   Period Weeks   Status Achieved     PT SHORT TERM GOAL #2   Title report 30% less HA throughout the day   Time 4   Period Weeks   Status On-going           PT Long Term Goals - 04/10/16 1626      PT LONG TERM GOAL #1   Title be independent in advanced HEP   Time 8   Period Weeks   Status On-going     PT LONG TERM GOAL #2   Title demonstrate cervical AROM within functional limits and without increased pain.   Time 8   Period Weeks   Status On-going     PT LONG TERM GOAL #3   Title report a 60% reduction in HA throughout the day   Time 8   Period Weeks   Status On-going     PT LONG TERM GOAL #4   Title demonstrate 4 to 4+/5 bilateral shoulder strength without increased pain   Time 8   Period Weeks   Status On-going               Plan - 04/10/16 1631    Clinical Impression Statement Able to progress strengthening exercises.  Will continue to need to review in order to ensure correct form so not to overactivate upper traps leading to increased cervical tension and increased HA.     Rehab Potential Good   Clinical Impairments Affecting Rehab Potential fibromyalgia, cervical fusions, migrains   PT Frequency 2x / week   PT Duration 8 weeks   PT Treatment/Interventions ADLs/Self Care Home Management;Cryotherapy;Electrical Stimulation;Moist Heat;Traction;Functional mobility training;Therapeutic activities;Therapeutic exercise;Balance training;Neuromuscular re-education;Patient/family education;Manual techniques;Passive range of motion;Dry needling;Taping   PT Next Visit Plan review all exercises, add shoulder AROM in standing to HEP, manual for increased thoracic extension   Consulted and Agree with Plan of  Care Patient      Patient will benefit from skilled therapeutic intervention in order to improve the following deficits and impairments:  Decreased activity tolerance, Decreased coordination, Decreased endurance, Decreased mobility, Decreased range of motion, Decreased strength, Hypomobility, Increased fascial restricitons,  Increased muscle spasms, Postural dysfunction, Pain  Visit Diagnosis: Tension-type headache, not intractable, unspecified chronicity pattern  Muscle weakness (generalized)  Abnormal posture     Problem List Patient Active Problem List   Diagnosis Date Noted  . Fibromyalgia 01/13/2016  . MGUS (monoclonal gammopathy of unknown significance) 12/23/2013  . Chronic cholecystitis without calculus 10/18/2012  . Abdominal pain, unspecified site 10/18/2012  . Nausea alone 10/18/2012  . Unspecified constipation 10/18/2012  . Depression 10/18/2012  . Bipolar 2 disorder ([place]) 10/18/2012  . Anxiety   . ASCUS (atypical squamous cells of undetermined significance) on Pap smear   . IUD   . Hemorrhoids 11/29/2010  . Abdominal pain, left upper quadrant 11/29/2010    Zannie Cove, PT 04/10/2016, 4:34 PM  [facility] [address] [place], [place], [address] Phone: [phone]   Fax:  [phone]  Name: Danielle Bruce MRN: [id number] Date of Birth: 04-06-1960

## 2016-04-13 ENCOUNTER — Encounter: Payer: Self-pay | Admitting: Physical Therapy

## 2016-04-13 ENCOUNTER — Ambulatory Visit: Payer: Medicare Other | Admitting: Physical Therapy

## 2016-04-13 DIAGNOSIS — G44209 Tension-type headache, unspecified, not intractable: Secondary | ICD-10-CM | POA: Diagnosis not present

## 2016-04-13 DIAGNOSIS — J3089 Other allergic rhinitis: Secondary | ICD-10-CM | POA: Diagnosis not present

## 2016-04-13 DIAGNOSIS — M6281 Muscle weakness (generalized): Secondary | ICD-10-CM | POA: Diagnosis not present

## 2016-04-13 DIAGNOSIS — R29898 Other symptoms and signs involving the musculoskeletal system: Secondary | ICD-10-CM | POA: Diagnosis not present

## 2016-04-13 DIAGNOSIS — J301 Allergic rhinitis due to pollen: Secondary | ICD-10-CM | POA: Diagnosis not present

## 2016-04-13 DIAGNOSIS — M25521 Pain in right elbow: Secondary | ICD-10-CM | POA: Diagnosis not present

## 2016-04-13 DIAGNOSIS — J3081 Allergic rhinitis due to animal (cat) (dog) hair and dander: Secondary | ICD-10-CM | POA: Diagnosis not present

## 2016-04-13 DIAGNOSIS — R293 Abnormal posture: Secondary | ICD-10-CM

## 2016-04-13 NOTE — Therapy (Signed)
[facility] [address], [name], [place], [address] Phone: [phone]   Fax:  [phone]  Physical Therapy Treatment  Patient Details  Name: Danielle Bruce MRN: [id number] Date of Birth: Feb 13, 1961 Referring Provider: Mayra Neer, MD  Encounter Date: 04/13/2016      PT End of Session - 04/13/16 1326    Visit Number 9   Number of Visits 10   Date for PT Re-Evaluation 05/02/16   Authorization Type Gcodes at 10th visit   PT Start Time 1232   PT Stop Time 1316   PT Time Calculation (min) 44 min   Activity Tolerance Patient tolerated treatment well   Behavior During Therapy [facility] for tasks assessed/performed      Past Medical History:  Diagnosis Date  . Anemia   . Anxiety   . Arthritis    osteoarthritis  . ASCUS (atypical squamous cells of undetermined significance) on Pap smear 05/06/05   NEG HIGH RISK HPV--C&B BIOPSY BENIGN 12/2005  . Asthma   . Bipolar 2 disorder ([place])   . Cancer ([place])    skin cancer - basal cell  . Colon polyps   . Complication of anesthesia    anxious afterwards, will get headaches   . Constipation   . Depression   . Fibromyalgia 10/2013  . GERD (gastroesophageal reflux disease)   . 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 Heart murmur    never had any problems  . Hemorrhoids   . High cholesterol   . 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   cytology negative  . IBS (irritable bowel syndrome)   . Insomnia   . Lymphocytic colitis   . 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 Migraines   . Osteoarthritis   . Osteopenia   . Peripheral neuropathy ([place])   . PTSD (post-traumatic stress disorder)     Past Surgical History:  Procedure Laterality Date  . BONE MARROW BIOPSY Left 12/18/2013   Plasma cell dyscrasia 8% population of plasma cells  . BREAST BIOPSY Right    benign stereo  . CESAREAN SECTION  [phone]  .  CHOLECYSTECTOMY N/A 10/16/2012   Procedure: LAPAROSCOPIC CHOLECYSTECTOMY;  Surgeon: Joyice Faster. Cornett, MD;  [facility];  Service: General;  Laterality: N/A;  . COLONOSCOPY     numerous times  . DILATION AND CURETTAGE OF UTERUS    . ESOPHAGOGASTRODUODENOSCOPY    . [place]   x3  . IUD REMOVAL  02/2015   Mirena  . LAPAROSCOPIC LYSIS OF ADHESIONS N/A 10/16/2012   Procedure: LAPAROSCOPIC LYSIS OF ADHESIONS;  Surgeon: Joyice Faster. Cornett, MD;  [facility];  Service: General;  Laterality: N/A;  . LAPAROSCOPY N/A 10/16/2012   Procedure: LAPAROSCOPY DIAGNOSTIC;  Surgeon: Joyice Faster. Cornett, MD;  [facility];  Service: General;  Laterality: N/A;  . PELVIC LAPAROSCOPY    . RADIOLOGY WITH ANESTHESIA N/A 12/16/2015   Procedure: MRI OF BRAIN WITH AND WITHOUT CONTRAST;  Surgeon: Medication Radiologist, MD;  Location: [place];  Service: Radiology;  Laterality: N/A;  . SHOULDER SURGERY  2007/2008  . SPINE SURGERY  2010   cervical    There were no vitals filed for this visit.      Subjective Assessment - 04/13/16 1238    Subjective Has a HA today.  Not sure what triggered it but her dog has.  Did heat and used tennis  balls behind head, but didn't help.  Tried to exercise but couldn't move.   Pertinent History fibromyalgia, cervical fusion, migrains.  Pt goes by "Danielle Bruce"   Currently in Pain? Yes   Pain Score 4    Pain Location Head   Pain Radiating Towards behind eyes   Pain Onset More than a month ago   Aggravating Factors  not sure   Pain Relieving Factors heat   Effect of Pain on Daily Activities can't move   Multiple Pain Sites No                         [facility] Adult PT Treatment/Exercise - 04/13/16 0001      Neck Exercises: Supine   Shoulder ABduction Both;20 reps  yellow band; horizontal abduction, diagonal both ways   Upper Extremity D2 Extension;Theraband;20 reps  both ways   Theraband Level (UE D2) Level 1 (Yellow)     Manual Therapy    Soft tissue mobilization masseter, pterygoids, pecs, cervical paraspinals, suboccipitals   Myofascial Release cervical muscles                  PT Short Term Goals - 04/03/16 1633      PT SHORT TERM GOAL #1   Title be independent in initial HEP   Time 4   Period Weeks   Status Achieved     PT SHORT TERM GOAL #2   Title report 30% less HA throughout the day   Time 4   Period Weeks   Status On-going           PT Long Term Goals - 04/10/16 1626      PT LONG TERM GOAL #1   Title be independent in advanced HEP   Time 8   Period Weeks   Status On-going     PT LONG TERM GOAL #2   Title demonstrate cervical AROM within functional limits and without increased pain.   Time 8   Period Weeks   Status On-going     PT LONG TERM GOAL #3   Title report a 60% reduction in HA throughout the day   Time 8   Period Weeks   Status On-going     PT LONG TERM GOAL #4   Title demonstrate 4 to 4+/5 bilateral shoulder strength without increased pain   Time 8   Period Weeks   Status On-going               Plan - 04/13/16 1326    Clinical Impression Statement Unable to do as many exericses today due to increased muscle tension.  Muscle spasms especially on Lt masseter, Lt suboccipital, Lt cervical paraspinals.  PT needed to address impairments and help with relaxing muscle spasms for return to improved function and get onto HEP.   Rehab Potential Good   Clinical Impairments Affecting Rehab Potential fibromyalgia, cervical fusions, migrains   PT Frequency 2x / week   PT Duration 8 weeks   PT Treatment/Interventions ADLs/Self Care Home Management;Cryotherapy;Electrical Stimulation;Moist Heat;Traction;Functional mobility training;Therapeutic activities;Therapeutic exercise;Balance training;Neuromuscular re-education;Patient/family education;Manual techniques;Passive range of motion;Dry needling;Taping   PT Next Visit Plan review all exercises, add shoulder AROM in standing  to HEP, manual for increased thoracic extension   Consulted and Agree with Plan of Care Patient      Patient will benefit from skilled therapeutic intervention in order to improve the following deficits and impairments:  Decreased activity tolerance, Decreased coordination, Decreased endurance, Decreased mobility,  Decreased range of motion, Decreased strength, Hypomobility, Increased fascial restricitons, Increased muscle spasms, Postural dysfunction, Pain  Visit Diagnosis: Tension-type headache, not intractable, unspecified chronicity pattern  Muscle weakness (generalized)  Abnormal posture     Problem List Patient Active Problem List   Diagnosis Date Noted  . Fibromyalgia 01/13/2016  . MGUS (monoclonal gammopathy of unknown significance) 12/23/2013  . Chronic cholecystitis without calculus 10/18/2012  . Abdominal pain, unspecified site 10/18/2012  . Nausea alone 10/18/2012  . Unspecified constipation 10/18/2012  . Depression 10/18/2012  . Bipolar 2 disorder ([place]) 10/18/2012  . Anxiety   . ASCUS (atypical squamous cells of undetermined significance) on Pap smear   . IUD   . Hemorrhoids 11/29/2010  . Abdominal pain, left upper quadrant 11/29/2010    Zannie Cove, PT 04/13/2016, 1:58 PM  [facility] [address] [place], [place], [address] Phone: [phone]   Fax:  [phone]  Name: LANAE FEDERER MRN: [id number] Date of Birth: 1960/06/23

## 2016-04-18 ENCOUNTER — Encounter: Payer: Medicare Other | Admitting: Physical Therapy

## 2016-04-20 ENCOUNTER — Ambulatory Visit: Payer: Medicare Other | Admitting: Physical Therapy

## 2016-04-20 DIAGNOSIS — R293 Abnormal posture: Secondary | ICD-10-CM

## 2016-04-20 DIAGNOSIS — K589 Irritable bowel syndrome without diarrhea: Secondary | ICD-10-CM | POA: Diagnosis not present

## 2016-04-20 DIAGNOSIS — G4700 Insomnia, unspecified: Secondary | ICD-10-CM | POA: Diagnosis not present

## 2016-04-20 DIAGNOSIS — G43909 Migraine, unspecified, not intractable, without status migrainosus: Secondary | ICD-10-CM | POA: Diagnosis not present

## 2016-04-20 DIAGNOSIS — M797 Fibromyalgia: Secondary | ICD-10-CM | POA: Diagnosis not present

## 2016-04-20 DIAGNOSIS — J301 Allergic rhinitis due to pollen: Secondary | ICD-10-CM | POA: Diagnosis not present

## 2016-04-20 DIAGNOSIS — J3081 Allergic rhinitis due to animal (cat) (dog) hair and dander: Secondary | ICD-10-CM | POA: Diagnosis not present

## 2016-04-20 DIAGNOSIS — M6281 Muscle weakness (generalized): Secondary | ICD-10-CM

## 2016-04-20 DIAGNOSIS — R29898 Other symptoms and signs involving the musculoskeletal system: Secondary | ICD-10-CM | POA: Diagnosis not present

## 2016-04-20 DIAGNOSIS — M25521 Pain in right elbow: Secondary | ICD-10-CM | POA: Diagnosis not present

## 2016-04-20 DIAGNOSIS — J3089 Other allergic rhinitis: Secondary | ICD-10-CM | POA: Diagnosis not present

## 2016-04-20 DIAGNOSIS — G44209 Tension-type headache, unspecified, not intractable: Secondary | ICD-10-CM

## 2016-04-20 DIAGNOSIS — M7062 Trochanteric bursitis, left hip: Secondary | ICD-10-CM | POA: Diagnosis not present

## 2016-04-20 DIAGNOSIS — F3181 Bipolar II disorder: Secondary | ICD-10-CM | POA: Diagnosis not present

## 2016-04-20 DIAGNOSIS — R400 Somnolence: Secondary | ICD-10-CM | POA: Diagnosis not present

## 2016-04-20 NOTE — Therapy (Signed)
[facility] [address] [place], [place], [address] Phone: [phone]   Fax:  [phone]  Physical Therapy Treatment  Patient Details  Name: Danielle Bruce MRN: [id number] Date of Birth: 1960-04-15 Referring Provider: Mayra Neer, MD  Encounter Date: 04/20/2016      PT End of Session - 04/20/16 1615    Visit Number 10   Number of Visits 10   Date for PT Re-Evaluation 06/01/16   Authorization Type Gcodes at 10th visit   PT Start Time 1615   PT Stop Time 1702   PT Time Calculation (min) 47 min   Activity Tolerance Patient tolerated treatment well   Behavior During Therapy [facility] for tasks assessed/performed      Past Medical History:  Diagnosis Date  . Anemia   . Anxiety   . Arthritis    osteoarthritis  . ASCUS (atypical squamous cells of undetermined significance) on Pap smear 05/06/05   NEG HIGH RISK HPV--C&B BIOPSY BENIGN 12/2005  . Asthma   . Bipolar 2 disorder ([place])   . Cancer ([place])    skin cancer - basal cell  . Colon polyps   . Complication of anesthesia    anxious afterwards, will get headaches   . Constipation   . Depression   . Fibromyalgia 10/2013  . GERD (gastroesophageal reflux disease)   . 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 Heart murmur    never had any problems  . Hemorrhoids   . High cholesterol   . 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   cytology negative  . IBS (irritable bowel syndrome)   . Insomnia   . Lymphocytic colitis   . 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 Migraines   . Osteoarthritis   . Osteopenia   . Peripheral neuropathy ([place])   . PTSD (post-traumatic stress disorder)     Past Surgical History:  Procedure Laterality Date  . BONE MARROW BIOPSY Left 12/18/2013   Plasma cell dyscrasia 8% population of plasma cells  . BREAST BIOPSY Right    benign stereo  . CESAREAN SECTION  [phone]  .  CHOLECYSTECTOMY N/A 10/16/2012   Procedure: LAPAROSCOPIC CHOLECYSTECTOMY;  Surgeon: Joyice Faster. Cornett, MD;  [facility];  Service: General;  Laterality: N/A;  . COLONOSCOPY     numerous times  . DILATION AND CURETTAGE OF UTERUS    . ESOPHAGOGASTRODUODENOSCOPY    . [place]   x3  . IUD REMOVAL  02/2015   Mirena  . LAPAROSCOPIC LYSIS OF ADHESIONS N/A 10/16/2012   Procedure: LAPAROSCOPIC LYSIS OF ADHESIONS;  Surgeon: Joyice Faster. Cornett, MD;  [facility];  Service: General;  Laterality: N/A;  . LAPAROSCOPY N/A 10/16/2012   Procedure: LAPAROSCOPY DIAGNOSTIC;  Surgeon: Joyice Faster. Cornett, MD;  [facility];  Service: General;  Laterality: N/A;  . PELVIC LAPAROSCOPY    . RADIOLOGY WITH ANESTHESIA N/A 12/16/2015   Procedure: MRI OF BRAIN WITH AND WITHOUT CONTRAST;  Surgeon: Medication Radiologist, MD;  Location: [facility];  Service: Radiology;  Laterality: N/A;  . SHOULDER SURGERY  2007/2008  . SPINE SURGERY  2010   cervical    There were no vitals filed for this visit.      Subjective Assessment - 04/20/16 1749    Subjective Patient feeling better than previous treatment, but hip is hurting today.  States she has a mild HA and feels  tension getting worse currently.   Pertinent History fibromyalgia, cervical fusion, migrains.  Pt goes by "Danielle Bruce"   Currently in Pain? Yes   Pain Score 2    Pain Location Head   Pain Orientation Right;Left;Posterior   Pain Descriptors / Indicators Throbbing;Tightness   Pain Type Chronic pain   Pain Onset More than a month ago   Pain Frequency Intermittent   Multiple Pain Sites No            [facility] PT Assessment - 04/20/16 0001      AROM   Cervical Extension 55 degrees + pain   Cervical - Right Rotation 80 degrees +pain   Cervical - Left Rotation 75 degrees +pain     Strength   Right Shoulder Flexion 5/5   Right Shoulder ABduction 5/5                     [facility] Adult PT Treatment/Exercise - 04/20/16 0001       Neck Exercises: Machines for Strengthening   UBE (Upper Arm Bike) L1 4x2     Manual Therapy   Soft tissue mobilization pecs, cervical paraspinals, suboccipitals   Myofascial Release cervical muscles                  PT Short Term Goals - 04/20/16 1623      PT SHORT TERM GOAL #1   Title be independent in initial HEP   Time 4   Period Weeks   Status Achieved     PT SHORT TERM GOAL #2   Title report 30% less HA throughout the day   Baseline only having HA 2-3 days per week >50% better   Time 4   Period Weeks   Status Achieved     PT SHORT TERM GOAL #3   Title able to read for 30 minutes without inducing a HA   Time 4   Period Weeks   Status Achieved           PT Long Term Goals - 04/20/16 1624      PT LONG TERM GOAL #1   Title be independent in advanced HEP   Baseline patient is unsure of HEP   Time 8   Period Weeks   Status On-going     PT LONG TERM GOAL #2   Title demonstrate cervical AROM within functional limits and without increased pain.   Time 8   Period Weeks   Status Achieved     PT LONG TERM GOAL #3   Title report a 60% reduction in HA throughout the day   Time 8   Period Weeks   Status Achieved     PT LONG TERM GOAL #4   Title demonstrate 4 to 4+/5 bilateral shoulder strength without increased pain   Period Weeks   Status Achieved               Plan - 04/20/16 1743    Clinical Impression Statement Patient has made great progress with increased cervical ROM, strength, and decreased frequency and severity of HA.  She continues to have flare ups due to complicating factors such as fibromyalgia, cervical fusions, migrains.  Patient has muscle spasms, fascial restrictions, increased kyphosis and needs cues during exercises for improved posture with emphasis on correctly performing muscles.  She is also inflamed with anxiety, so it would be best to gradually reduce appointment frequency to allow for successful transition to HEP.   PT recommends skilled PT to  continue for several more visits in order to address remaining impairments and ensure successful transition to HEP   Rehab Potential Good   Clinical Impairments Affecting Rehab Potential fibromyalgia, cervical fusions, migrains   PT Frequency 1x / week   PT Duration 6 weeks   PT Treatment/Interventions ADLs/Self Care Home Management;Cryotherapy;Electrical Stimulation;Moist Heat;Traction;Functional mobility training;Therapeutic activities;Therapeutic exercise;Balance training;Neuromuscular re-education;Patient/family education;Manual techniques;Passive range of motion;Dry needling;Taping   PT Next Visit Plan review all exercises, add shoulder AROM in standing to HEP, manual and exercises for increased thoracic extension   Consulted and Agree with Plan of Care Patient      Patient will benefit from skilled therapeutic intervention in order to improve the following deficits and impairments:  Increased muscle spasms, Increased fascial restricitons, Pain, Postural dysfunction  Visit Diagnosis: Tension-type headache, not intractable, unspecified chronicity pattern  Muscle weakness (generalized)  Abnormal posture       G-Codes - 2016/05/05 1747    Functional Assessment Tool Used (Outpatient Only) clinical judgement   Functional Limitation Changing and maintaining body position   Changing and Maintaining Body Position Current Status (O0370) At least 20 percent but less than 40 percent impaired, limited or restricted   Changing and Maintaining Body Position Goal Status (W8889) At least 20 percent but less than 40 percent impaired, limited or restricted      Problem List Patient Active Problem List   Diagnosis Date Noted  . Fibromyalgia 01/13/2016  . MGUS (monoclonal gammopathy of unknown significance) 12/23/2013  . Chronic cholecystitis without calculus 10/18/2012  . Abdominal pain, unspecified site 10/18/2012  . Nausea alone 10/18/2012  . Unspecified  constipation 10/18/2012  . Depression 10/18/2012  . Bipolar 2 disorder ([place]) 10/18/2012  . Anxiety   . ASCUS (atypical squamous cells of undetermined significance) on Pap smear   . IUD   . Hemorrhoids 11/29/2010  . Abdominal pain, left upper quadrant 11/29/2010    Zannie Cove, PT 05-May-2016, 5:55 PM  [facility] [address], [place], [place], [address] Phone: [phone]   Fax:  [phone]  Name: Danielle Bruce MRN: [id number] Date of Birth: 02-Jan-1961

## 2016-04-21 ENCOUNTER — Encounter: Payer: Medicare Other | Admitting: Physical Therapy

## 2016-04-25 ENCOUNTER — Ambulatory Visit: Payer: Medicare Other | Admitting: Physical Therapy

## 2016-04-25 DIAGNOSIS — R293 Abnormal posture: Secondary | ICD-10-CM

## 2016-04-25 DIAGNOSIS — G44209 Tension-type headache, unspecified, not intractable: Secondary | ICD-10-CM

## 2016-04-25 DIAGNOSIS — J301 Allergic rhinitis due to pollen: Secondary | ICD-10-CM | POA: Diagnosis not present

## 2016-04-25 DIAGNOSIS — J3089 Other allergic rhinitis: Secondary | ICD-10-CM | POA: Diagnosis not present

## 2016-04-25 DIAGNOSIS — R29898 Other symptoms and signs involving the musculoskeletal system: Secondary | ICD-10-CM | POA: Diagnosis not present

## 2016-04-25 DIAGNOSIS — J3081 Allergic rhinitis due to animal (cat) (dog) hair and dander: Secondary | ICD-10-CM | POA: Diagnosis not present

## 2016-04-25 DIAGNOSIS — M6281 Muscle weakness (generalized): Secondary | ICD-10-CM | POA: Diagnosis not present

## 2016-04-25 DIAGNOSIS — M25521 Pain in right elbow: Secondary | ICD-10-CM | POA: Diagnosis not present

## 2016-04-25 NOTE — Therapy (Signed)
[facility] [address], [place], [place], [address] Phone: [phone]   Fax:  [phone]  Physical Therapy Treatment  Patient Details  Name: Danielle Bruce MRN: [id number] Date of Birth: 06-Feb-1961 Referring Provider: Mayra Neer, MD  Encounter Date: 04/25/2016      PT End of Session - 04/25/16 1723    Visit Number 11   Number of Visits 20   Date for PT Re-Evaluation 06/01/16   Authorization Type Gcodes at 20th visit; KX at 13   PT Start Time 1532   PT Stop Time 1616   PT Time Calculation (min) 44 min   Activity Tolerance Patient tolerated treatment well   Behavior During Therapy [facility] for tasks assessed/performed      Past Medical History:  Diagnosis Date  . Anemia   . Anxiety   . Arthritis    osteoarthritis  . ASCUS (atypical squamous cells of undetermined significance) on Pap smear 05/06/05   NEG HIGH RISK HPV--C&B BIOPSY BENIGN 12/2005  . Asthma   . Bipolar 2 disorder ([place])   . Cancer ([place])    skin cancer - basal cell  . Colon polyps   . Complication of anesthesia    anxious afterwards, will get headaches   . Constipation   . Depression   . Fibromyalgia 10/2013  . GERD (gastroesophageal reflux disease)   . 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 Heart murmur    never had any problems  . Hemorrhoids   . High cholesterol   . 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   cytology negative  . IBS (irritable bowel syndrome)   . Insomnia   . Lymphocytic colitis   . 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 Migraines   . Osteoarthritis   . Osteopenia   . Peripheral neuropathy ([place])   . PTSD (post-traumatic stress disorder)     Past Surgical History:  Procedure Laterality Date  . BONE MARROW BIOPSY Left 12/18/2013   Plasma cell dyscrasia 8% population of plasma cells  . BREAST BIOPSY Right    benign stereo  . CESAREAN SECTION   [phone]  . CHOLECYSTECTOMY N/A 10/16/2012   Procedure: LAPAROSCOPIC CHOLECYSTECTOMY;  Surgeon: Joyice Faster. Cornett, MD;  [facility];  Service: General;  Laterality: N/A;  . COLONOSCOPY     numerous times  . DILATION AND CURETTAGE OF UTERUS    . ESOPHAGOGASTRODUODENOSCOPY    . [place]   x3  . IUD REMOVAL  02/2015   Mirena  . LAPAROSCOPIC LYSIS OF ADHESIONS N/A 10/16/2012   Procedure: LAPAROSCOPIC LYSIS OF ADHESIONS;  Surgeon: Joyice Faster. Cornett, MD;  [facility];  Service: General;  Laterality: N/A;  . LAPAROSCOPY N/A 10/16/2012   Procedure: LAPAROSCOPY DIAGNOSTIC;  Surgeon: Joyice Faster. Cornett, MD;  [facility];  Service: General;  Laterality: N/A;  . PELVIC LAPAROSCOPY    . RADIOLOGY WITH ANESTHESIA N/A 12/16/2015   Procedure: MRI OF BRAIN WITH AND WITHOUT CONTRAST;  Surgeon: Medication Radiologist, MD;  Location: [facility];  Service: Radiology;  Laterality: N/A;  . SHOULDER SURGERY  2007/2008  . SPINE SURGERY  2010   cervical    There were no vitals filed for this visit.      Subjective Assessment - 04/25/16 1541    Subjective HA has been really bad I think because of getting off prednisone.   Pertinent History fibromyalgia,  cervical fusion, migrains.  Pt goes by "[name]"   Limitations Reading;Other (comment)   Patient Stated Goals Be able to do things like go to son's going away party   Currently in Pain? Yes   Pain Score 4   HA   Pain Location Head   Pain Orientation Right;Other (Comment);Posterior   Pain Descriptors / Indicators Pressure   Pain Type Chronic pain   Pain Onset More than a month ago   Pain Frequency Intermittent   Aggravating Factors  maybe going off prednisone   Pain Relieving Factors sleep   Effect of Pain on Daily Activities can't get up   Multiple Pain Sites No                         [facility] Adult PT Treatment/Exercise - 04/25/16 0001      Manual Therapy   Soft tissue mobilization pecs, cervical  paraspinals, suboccipitals   Myofascial Release cervical muscles                  PT Short Term Goals - 04/20/16 1623      PT SHORT TERM GOAL #1   Title be independent in initial HEP   Time 4   Period Weeks   Status Achieved     PT SHORT TERM GOAL #2   Title report 30% less HA throughout the day   Baseline only having HA 2-3 days per week >50% better   Time 4   Period Weeks   Status Achieved     PT SHORT TERM GOAL #3   Title able to read for 30 minutes without inducing a HA   Time 4   Period Weeks   Status Achieved           PT Long Term Goals - 04/20/16 1624      PT LONG TERM GOAL #1   Title be independent in advanced HEP   Baseline patient is unsure of HEP   Time 8   Period Weeks   Status On-going     PT LONG TERM GOAL #2   Title demonstrate cervical AROM within functional limits and without increased pain.   Time 8   Period Weeks   Status Achieved     PT LONG TERM GOAL #3   Title report a 60% reduction in HA throughout the day   Time 8   Period Weeks   Status Achieved     PT LONG TERM GOAL #4   Title demonstrate 4 to 4+/5 bilateral shoulder strength without increased pain   Period Weeks   Status Achieved               Plan - 04/25/16 1724    Clinical Impression Statement Patient responded well to manual, will re-assess at next visit long term effects.  Patient states her doctor wanted her to see if PT would help when having a migrain however, she wasn't able to get in yesterday when it was really bad.  She did respond well today.  She will also be ocming in Thursday to add Left hip pain to plan of care.   Rehab Potential Good   Clinical Impairments Affecting Rehab Potential fibromyalgia, cervical fusions, migrains   PT Treatment/Interventions ADLs/Self Care Home Management;Cryotherapy;Electrical Stimulation;Moist Heat;Traction;Functional mobility training;Therapeutic activities;Therapeutic exercise;Balance training;Neuromuscular  re-education;Patient/family education;Manual techniques;Passive range of motion;Dry needling;Taping   PT Next Visit Plan Re-Eval to add left hip to POC   Consulted and Agree with Plan  of Care Patient      Patient will benefit from skilled therapeutic intervention in order to improve the following deficits and impairments:  Increased muscle spasms, Increased fascial restricitons, Pain, Postural dysfunction  Visit Diagnosis: Tension-type headache, not intractable, unspecified chronicity pattern  Muscle weakness (generalized)  Abnormal posture     Problem List Patient Active Problem List   Diagnosis Date Noted  . Fibromyalgia 01/13/2016  . MGUS (monoclonal gammopathy of unknown significance) 12/23/2013  . Chronic cholecystitis without calculus 10/18/2012  . Abdominal pain, unspecified site 10/18/2012  . Nausea alone 10/18/2012  . Unspecified constipation 10/18/2012  . Depression 10/18/2012  . Bipolar 2 disorder ([place]) 10/18/2012  . Anxiety   . ASCUS (atypical squamous cells of undetermined significance) on Pap smear   . IUD   . Hemorrhoids 11/29/2010  . Abdominal pain, left upper quadrant 11/29/2010    Zannie Cove, PT 04/25/2016, 5:27 PM  [facility] [address] [place], [place], [address] Phone: [phone]   Fax:  [phone]  Name: Danielle Bruce MRN: [id number] Date of Birth: 09-06-1960

## 2016-04-27 ENCOUNTER — Ambulatory Visit: Payer: Medicare Other | Attending: Family Medicine | Admitting: Physical Therapy

## 2016-04-27 ENCOUNTER — Telehealth: Payer: Self-pay | Admitting: Physical Therapy

## 2016-04-27 DIAGNOSIS — R293 Abnormal posture: Secondary | ICD-10-CM | POA: Insufficient documentation

## 2016-04-27 DIAGNOSIS — G44209 Tension-type headache, unspecified, not intractable: Secondary | ICD-10-CM | POA: Insufficient documentation

## 2016-04-27 DIAGNOSIS — R252 Cramp and spasm: Secondary | ICD-10-CM | POA: Insufficient documentation

## 2016-04-27 DIAGNOSIS — M25552 Pain in left hip: Secondary | ICD-10-CM | POA: Insufficient documentation

## 2016-04-27 DIAGNOSIS — M6281 Muscle weakness (generalized): Secondary | ICD-10-CM | POA: Insufficient documentation

## 2016-04-27 NOTE — Telephone Encounter (Signed)
Patient did not show up for appointment today.  Called patient and went to voice mail.  Left a message to please call back  Zannie Cove, PT 04/27/16 2:27 PM

## 2016-05-03 ENCOUNTER — Ambulatory Visit: Payer: Medicare Other | Admitting: Physical Therapy

## 2016-05-03 DIAGNOSIS — R293 Abnormal posture: Secondary | ICD-10-CM | POA: Diagnosis not present

## 2016-05-03 DIAGNOSIS — J3089 Other allergic rhinitis: Secondary | ICD-10-CM | POA: Diagnosis not present

## 2016-05-03 DIAGNOSIS — J301 Allergic rhinitis due to pollen: Secondary | ICD-10-CM | POA: Diagnosis not present

## 2016-05-03 DIAGNOSIS — M6281 Muscle weakness (generalized): Secondary | ICD-10-CM

## 2016-05-03 DIAGNOSIS — M25552 Pain in left hip: Secondary | ICD-10-CM | POA: Diagnosis not present

## 2016-05-03 DIAGNOSIS — R252 Cramp and spasm: Secondary | ICD-10-CM | POA: Diagnosis not present

## 2016-05-03 DIAGNOSIS — J3081 Allergic rhinitis due to animal (cat) (dog) hair and dander: Secondary | ICD-10-CM | POA: Diagnosis not present

## 2016-05-03 DIAGNOSIS — G44209 Tension-type headache, unspecified, not intractable: Secondary | ICD-10-CM | POA: Diagnosis not present

## 2016-05-04 ENCOUNTER — Encounter: Payer: Self-pay | Admitting: Physical Therapy

## 2016-05-04 NOTE — Therapy (Signed)
[facility] [address] [place], [place], [address] Phone: [phone]   Fax:  [phone]  Physical Therapy Treatment  Patient Details  Name: Danielle Bruce MRN: [id number] Date of Birth: 1960-07-25 Referring Provider: Mayra Neer, MD  Encounter Date: 05/03/2016      PT End of Session - 05/04/16 1044    Visit Number 12   Number of Visits 20   Date for PT Re-Evaluation 06/01/16   Authorization Type Gcodes at 20th visit; KX at 97   PT Start Time 1410   PT Stop Time 1446   PT Time Calculation (min) 36 min   Activity Tolerance Patient tolerated treatment well   Behavior During Therapy [facility] for tasks assessed/performed      Past Medical History:  Diagnosis Date  . Anemia   . Anxiety   . Arthritis    osteoarthritis  . ASCUS (atypical squamous cells of undetermined significance) on Pap smear 05/06/05   NEG HIGH RISK HPV--C&B BIOPSY BENIGN 12/2005  . Asthma   . Bipolar 2 disorder ([place])   . Cancer ([place])    skin cancer - basal cell  . Colon polyps   . Complication of anesthesia    anxious afterwards, will get headaches   . Constipation   . Depression   . Fibromyalgia 10/2013  . GERD (gastroesophageal reflux disease)   . 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 Heart murmur    never had any problems  . Hemorrhoids   . High cholesterol   . 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   cytology negative  . IBS (irritable bowel syndrome)   . Insomnia   . Lymphocytic colitis   . 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 Migraines   . Osteoarthritis   . Osteopenia   . Peripheral neuropathy ([place])   . PTSD (post-traumatic stress disorder)     Past Surgical History:  Procedure Laterality Date  . BONE MARROW BIOPSY Left 12/18/2013   Plasma cell dyscrasia 8% population of plasma cells  . BREAST BIOPSY Right    benign stereo  . CESAREAN SECTION   [phone]  . CHOLECYSTECTOMY N/A 10/16/2012   Procedure: LAPAROSCOPIC CHOLECYSTECTOMY;  Surgeon: Joyice Faster. Cornett, MD;  [facility];  Service: General;  Laterality: N/A;  . COLONOSCOPY     numerous times  . DILATION AND CURETTAGE OF UTERUS    . ESOPHAGOGASTRODUODENOSCOPY    . [place]   x3  . IUD REMOVAL  02/2015   Mirena  . LAPAROSCOPIC LYSIS OF ADHESIONS N/A 10/16/2012   Procedure: LAPAROSCOPIC LYSIS OF ADHESIONS;  Surgeon: Joyice Faster. Cornett, MD;  [facility];  Service: General;  Laterality: N/A;  . LAPAROSCOPY N/A 10/16/2012   Procedure: LAPAROSCOPY DIAGNOSTIC;  Surgeon: Joyice Faster. Cornett, MD;  [facility];  Service: General;  Laterality: N/A;  . PELVIC LAPAROSCOPY    . RADIOLOGY WITH ANESTHESIA N/A 12/16/2015   Procedure: MRI OF BRAIN WITH AND WITHOUT CONTRAST;  Surgeon: Medication Radiologist, MD;  Location: [facility];  Service: Radiology;  Laterality: N/A;  . SHOULDER SURGERY  2007/2008  . SPINE SURGERY  2010   cervical    There were no vitals filed for this visit.      Subjective Assessment - 05/03/16 1457    Subjective Has a headache today but has been much better overall.  Hip has been hurting but head  is hurting worse today and wants to focus on that.  States hip is hurting more with walking and stairs.  Denies pain currently in hip.   Pertinent History fibromyalgia, cervical fusion, migrains.  Pt goes by "[name]"   How long can you sit comfortably? --   How long can you stand comfortably? --   Currently in Pain? Yes   Pain Score 4    Pain Location Head   Pain Orientation --  Rt>Lt   Pain Descriptors / Indicators Aching   Pain Type Chronic pain   Pain Onset More than a month ago   Aggravating Factors  --   Pain Relieving Factors --   Multiple Pain Sites No                         [facility] Adult PT Treatment/Exercise - 05/04/16 0001      Manual Therapy   Manual therapy comments in supine   Myofascial Release cervical  muscles, SCMs, suboccipitals, scalenes, upper traps, temporalis and masseters                  PT Short Term Goals - 04/20/16 1623      PT SHORT TERM GOAL #1   Title be independent in initial HEP   Time 4   Period Weeks   Status Achieved     PT SHORT TERM GOAL #2   Title report 30% less HA throughout the day   Baseline only having HA 2-3 days per week >50% better   Time 4   Period Weeks   Status Achieved     PT SHORT TERM GOAL #3   Title able to read for 30 minutes without inducing a HA   Time 4   Period Weeks   Status Achieved           PT Long Term Goals - 05/04/16 1049      PT LONG TERM GOAL #1   Title be independent in advanced HEP   Baseline patient is unsure of HEP   Time 8   Period Weeks   Status On-going               Plan - 05/04/16 1045    Clinical Impression Statement Patient had reduced HA symptoms after manual myofascial release.  Palpable release occurred with muscle tension relaxation.  PT discussed hip during manual treatment.  Pt has order to treat hip and will assess and add to plan of care at next visit.  Pt recommended to continue skilled PT to progress soft tissue release and address left hip pain.   Rehab Potential Good   Clinical Impairments Affecting Rehab Potential fibromyalgia, cervical fusions, migrains   PT Treatment/Interventions ADLs/Self Care Home Management;Cryotherapy;Electrical Stimulation;Moist Heat;Traction;Functional mobility training;Therapeutic activities;Therapeutic exercise;Balance training;Neuromuscular re-education;Patient/family education;Manual techniques;Passive range of motion;Dry needling;Taping   PT Next Visit Plan Re-Eval to add left hip to POC   PT Home Exercise Plan progress as needed   Consulted and Agree with Plan of Care Patient      Patient will benefit from skilled therapeutic intervention in order to improve the following deficits and impairments:  Increased muscle spasms, Increased fascial  restricitons, Pain, Postural dysfunction  Visit Diagnosis: Tension-type headache, not intractable, unspecified chronicity pattern  Muscle weakness (generalized)  Abnormal posture     Problem List Patient Active Problem List   Diagnosis Date Noted  . Fibromyalgia 01/13/2016  . MGUS (monoclonal gammopathy of unknown significance) 12/23/2013  .  Chronic cholecystitis without calculus 10/18/2012  . Abdominal pain, unspecified site 10/18/2012  . Nausea alone 10/18/2012  . Unspecified constipation 10/18/2012  . Depression 10/18/2012  . Bipolar 2 disorder ([place]) 10/18/2012  . Anxiety   . ASCUS (atypical squamous cells of undetermined significance) on Pap smear   . IUD   . Hemorrhoids 11/29/2010  . Abdominal pain, left upper quadrant 11/29/2010    Zannie Cove, PT 05/04/2016, 10:50 AM  [facility] [address], [place], [place], [address] Phone: [phone]   Fax:  [phone]  Name: Danielle Bruce MRN: [id number] Date of Birth: 1960-07-26

## 2016-05-05 DIAGNOSIS — J3089 Other allergic rhinitis: Secondary | ICD-10-CM | POA: Diagnosis not present

## 2016-05-05 DIAGNOSIS — J3081 Allergic rhinitis due to animal (cat) (dog) hair and dander: Secondary | ICD-10-CM | POA: Diagnosis not present

## 2016-05-05 DIAGNOSIS — J301 Allergic rhinitis due to pollen: Secondary | ICD-10-CM | POA: Diagnosis not present

## 2016-05-10 ENCOUNTER — Ambulatory Visit: Payer: Medicare Other | Admitting: Physical Therapy

## 2016-05-10 DIAGNOSIS — G44209 Tension-type headache, unspecified, not intractable: Secondary | ICD-10-CM

## 2016-05-10 DIAGNOSIS — M25552 Pain in left hip: Secondary | ICD-10-CM | POA: Diagnosis not present

## 2016-05-10 DIAGNOSIS — R293 Abnormal posture: Secondary | ICD-10-CM | POA: Diagnosis not present

## 2016-05-10 DIAGNOSIS — M6281 Muscle weakness (generalized): Secondary | ICD-10-CM

## 2016-05-10 DIAGNOSIS — J301 Allergic rhinitis due to pollen: Secondary | ICD-10-CM | POA: Diagnosis not present

## 2016-05-10 DIAGNOSIS — R252 Cramp and spasm: Secondary | ICD-10-CM | POA: Diagnosis not present

## 2016-05-10 DIAGNOSIS — J3081 Allergic rhinitis due to animal (cat) (dog) hair and dander: Secondary | ICD-10-CM | POA: Diagnosis not present

## 2016-05-10 DIAGNOSIS — J3089 Other allergic rhinitis: Secondary | ICD-10-CM | POA: Diagnosis not present

## 2016-05-15 ENCOUNTER — Ambulatory Visit: Payer: Medicare Other | Admitting: Physical Therapy

## 2016-05-15 DIAGNOSIS — M25552 Pain in left hip: Secondary | ICD-10-CM

## 2016-05-15 DIAGNOSIS — M6281 Muscle weakness (generalized): Secondary | ICD-10-CM | POA: Diagnosis not present

## 2016-05-15 DIAGNOSIS — R293 Abnormal posture: Secondary | ICD-10-CM | POA: Diagnosis not present

## 2016-05-15 DIAGNOSIS — R252 Cramp and spasm: Secondary | ICD-10-CM

## 2016-05-15 DIAGNOSIS — J3081 Allergic rhinitis due to animal (cat) (dog) hair and dander: Secondary | ICD-10-CM | POA: Diagnosis not present

## 2016-05-15 DIAGNOSIS — G44209 Tension-type headache, unspecified, not intractable: Secondary | ICD-10-CM

## 2016-05-15 DIAGNOSIS — J3089 Other allergic rhinitis: Secondary | ICD-10-CM | POA: Diagnosis not present

## 2016-05-15 DIAGNOSIS — J301 Allergic rhinitis due to pollen: Secondary | ICD-10-CM | POA: Diagnosis not present

## 2016-05-15 NOTE — Therapy (Signed)
[facility] [address], [place], [place], [address] Phone: [phone]   Fax:  [phone]  Physical Therapy Treatment  Patient Details  Name: Danielle Bruce MRN: [id number] Date of Birth: 12/19/1960 Referring Provider: Mayra Neer, MD  Encounter Date: 05/10/2016      PT End of Session - 05/15/16 0708    Visit Number 13   Number of Visits 20   Date for PT Re-Evaluation 07/05/16   Authorization Type Gcodes at 20th visit; KX at 17   PT Start Time 1530   PT Stop Time 1612   PT Time Calculation (min) 42 min   Activity Tolerance Patient tolerated treatment well   Behavior During Therapy [facility] for tasks assessed/performed      Past Medical History:  Diagnosis Date  . Anemia   . Anxiety   . Arthritis    osteoarthritis  . ASCUS (atypical squamous cells of undetermined significance) on Pap smear 05/06/05   NEG HIGH RISK HPV--C&B BIOPSY BENIGN 12/2005  . Asthma   . Bipolar 2 disorder ([place])   . Cancer ([place])    skin cancer - basal cell  . Colon polyps   . Complication of anesthesia    anxious afterwards, will get headaches   . Constipation   . Depression   . Fibromyalgia 10/2013  . GERD (gastroesophageal reflux disease)   . 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 Heart murmur    never had any problems  . Hemorrhoids   . High cholesterol   . 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   cytology negative  . IBS (irritable bowel syndrome)   . Insomnia   . Lymphocytic colitis   . 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 Migraines   . Osteoarthritis   . Osteopenia   . Peripheral neuropathy ([place])   . PTSD (post-traumatic stress disorder)     Past Surgical History:  Procedure Laterality Date  . BONE MARROW BIOPSY Left 12/18/2013   Plasma cell dyscrasia 8% population of plasma cells  . BREAST BIOPSY Right    benign stereo  . CESAREAN SECTION   [phone]  . CHOLECYSTECTOMY N/A 10/16/2012   Procedure: LAPAROSCOPIC CHOLECYSTECTOMY;  Surgeon: Joyice Faster. Cornett, MD;  Location: [facility];  Service: General;  Laterality: N/A;  . COLONOSCOPY     numerous times  . DILATION AND CURETTAGE OF UTERUS    . ESOPHAGOGASTRODUODENOSCOPY    . [place]   x3  . IUD REMOVAL  02/2015   Mirena  . LAPAROSCOPIC LYSIS OF ADHESIONS N/A 10/16/2012   Procedure: LAPAROSCOPIC LYSIS OF ADHESIONS;  Surgeon: Joyice Faster. Cornett, MD;  Location: [facility];  Service: General;  Laterality: N/A;  . LAPAROSCOPY N/A 10/16/2012   Procedure: LAPAROSCOPY DIAGNOSTIC;  Surgeon: Joyice Faster. Cornett, MD;  Location: [facility];  Service: General;  Laterality: N/A;  . PELVIC LAPAROSCOPY    . RADIOLOGY WITH ANESTHESIA N/A 12/16/2015   Procedure: MRI OF BRAIN WITH AND WITHOUT CONTRAST;  Surgeon: Medication Radiologist, MD;  Location: [place];  Service: Radiology;  Laterality: N/A;  . SHOULDER SURGERY  2007/2008  . SPINE SURGERY  2010   cervical    There were no vitals filed for this visit.      Subjective Assessment - 05/15/16 0714    Subjective Reports hip pain worst with stairs.  Walking aggravates too but not as much as stairs or  elevation.     Pertinent History fibromyalgia, cervical fusion, migrains.  Pt goes by "Danielle Bruce"   Limitations Reading;Walking   How long can you walk comfortably? 20-30 minutes   Patient Stated Goals Be able to do things like go to son's going away party   Currently in Pain? Yes   Pain Score 7    Pain Location Hip   Pain Orientation Left   Pain Descriptors / Indicators Aching   Pain Type Acute pain   Pain Onset More than a month ago   Pain Frequency Intermittent   Aggravating Factors  walking and stairs   Pain Relieving Factors rest   Effect of Pain on Daily Activities exercise and doing things around the house   Multiple Pain Sites No            [facility] PT Assessment - 05/15/16 0001      Strength   Overall Strength Deficits  left  hip abduction and adduction 3+/5     Palpation   Palpation comment tender along IT band and around  greater trochanter     Ambulation/Gait   Gait Pattern Trendelenburg  left trendelenburg                     [facility] Adult PT Treatment/Exercise - 05/15/16 0001      Modalities   Modalities Ultrasound     Ultrasound   Ultrasound Location left hip around greater trochanter   Ultrasound Parameters 20% duty, 25mz, 1.3 intensity   Ultrasound Goals Pain;Other (Comment)  decrease inflammation     Manual Therapy   Manual Therapy Soft tissue mobilization   Manual therapy comments right side lying   Soft tissue mobilization glutes and IT band                  PT Short Term Goals - 05/15/16 0701      PT SHORT TERM GOAL #1   Title be independent in initial HEP for LE   Time 4   Period Weeks   Status Revised     PT SHORT TERM GOAL #2   Title increased hip strength to 4/5 bilateral hip abduction and adduction for improved gait   Time 4   Period Weeks   Status New           PT Long Term Goals - 05/15/16 0703      PT LONG TERM GOAL #1   Title be independent in advanced HEP   Time 8   Period Weeks   Status On-going     PT LONG TERM GOAL #2   Title able to walk around the mall with her friend with 60% reduction in pain   Time 8   Period Weeks   Status New     PT LONG TERM GOAL #3   Title report 75% less HA throughout the day   Time 8   Period Weeks   Status Revised     PT LONG TERM GOAL #4   Title demonstrate 4+/5 bilateral hip strength without increased pain   Time 8   Period Weeks   Status New     PT LONG TERM GOAL #5   Title able to go up and down one flight of stairs without increased pain   Time 8   Period Weeks   Status New               Plan - 05/15/16 0644    Clinical Impression Statement Patient  presents for re-eval today due to hip pain.  Pt has doctor referral for pain in left hip.  Patient is experiencing 7/10 pain  with stairs.  She has hip weakness of 3+/5 MMT.  Gait deficits with Trendelenburg on left side.  Pt also has muscle spasms in glutes, TFL and fascial adhesions and tenderness of IT band.  Pt continues to have headaches that she feels are getting to be fewer since starting physical therapy.  Pt will benefit from skilled PT to address these impairments and add to plan of care for left hip.   Rehab Potential Excellent   Clinical Impairments Affecting Rehab Potential fibromyalgia, cervical fusions, migrains   PT Frequency 2x / week   PT Duration 8 weeks   PT Treatment/Interventions ADLs/Self Care Home Management;Cryotherapy;Electrical Stimulation;Moist Heat;Traction;Functional mobility training;Therapeutic activities;Therapeutic exercise;Balance training;Neuromuscular re-education;Patient/family education;Manual techniques;Passive range of motion;Dry needling;Taping;Ultrasound   PT Next Visit Plan ultrasound, manual, LE strength as tolerated, stretches   PT Home Exercise Plan progress as needed   Recommended Other Services none   Consulted and Agree with Plan of Care Patient      Patient will benefit from skilled therapeutic intervention in order to improve the following deficits and impairments:  Increased muscle spasms, Increased fascial restricitons, Pain, Postural dysfunction, Abnormal gait, Decreased strength, Difficulty walking  Visit Diagnosis: Tension-type headache, not intractable, unspecified chronicity pattern - Plan: PT plan of care cert/re-cert  Muscle weakness (generalized) - Plan: PT plan of care cert/re-cert  Pain in left hip - Plan: PT plan of care cert/re-cert  Cramp and spasm - Plan: PT plan of care cert/re-cert       G-Codes - 06-02-16 3010    Functional Assessment Tool Used (Outpatient Only) FOTO and clinical judgement   Functional Limitation Mobility: Walking and moving around   Mobility: Walking and Moving Around Current Status [phone]) At least 40 percent but less  than 60 percent impaired, limited or restricted   Mobility: Walking and Moving Around Goal Status [phone]) At least 20 percent but less than 40 percent impaired, limited or restricted      Problem List Patient Active Problem List   Diagnosis Date Noted  . Fibromyalgia 01/13/2016  . MGUS (monoclonal gammopathy of unknown significance) 12/23/2013  . Chronic cholecystitis without calculus 10/18/2012  . Abdominal pain, unspecified site 10/18/2012  . Nausea alone 10/18/2012  . Unspecified constipation 10/18/2012  . Depression 10/18/2012  . Bipolar 2 disorder ([place]) 10/18/2012  . Anxiety   . ASCUS (atypical squamous cells of undetermined significance) on Pap smear   . IUD   . Hemorrhoids 11/29/2010  . Abdominal pain, left upper quadrant 11/29/2010    Zannie Cove, PT 2016/06/02, 7:15 AM  [facility] [address], [place], [place], [address] Phone: [phone]   Fax:  [phone]  Name: Danielle Bruce MRN: [id number] Date of Birth: [birth year]

## 2016-05-15 NOTE — Therapy (Signed)
[facility] [address] [place], [place], [address] Phone: [phone]   Fax:  [phone]  Physical Therapy Treatment  Patient Details  Name: Danielle Bruce MRN: [id number] Date of Birth: 1961-02-19 Referring Provider: Mayra Neer, MD  Encounter Date: 05/15/2016      PT End of Session - 05/15/16 1723    Visit Number 14   Number of Visits 20   Date for PT Re-Evaluation 07/05/16   Authorization Type Gcodes at 20th visit; KX at 22   PT Start Time 1532   PT Stop Time 1616   PT Time Calculation (min) 44 min   Activity Tolerance Patient tolerated treatment well   Behavior During Therapy [facility] for tasks assessed/performed      Past Medical History:  Diagnosis Date  . Anemia   . Anxiety   . Arthritis    osteoarthritis  . ASCUS (atypical squamous cells of undetermined significance) on Pap smear 05/06/05   NEG HIGH RISK HPV--C&B BIOPSY BENIGN 12/2005  . Asthma   . Bipolar 2 disorder ([place])   . Cancer ([place])    skin cancer - basal cell  . Colon polyps   . Complication of anesthesia    anxious afterwards, will get headaches   . Constipation   . Depression   . Fibromyalgia 10/2013  . GERD (gastroesophageal reflux disease)   . 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 Heart murmur    never had any problems  . Hemorrhoids   . High cholesterol   . 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   cytology negative  . IBS (irritable bowel syndrome)   . Insomnia   . Lymphocytic colitis   . 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 Migraines   . Osteoarthritis   . Osteopenia   . Peripheral neuropathy ([place])   . PTSD (post-traumatic stress disorder)     Past Surgical History:  Procedure Laterality Date  . BONE MARROW BIOPSY Left 12/18/2013   Plasma cell dyscrasia 8% population of plasma cells  . BREAST BIOPSY Right    benign stereo  . CESAREAN SECTION   [phone]  . CHOLECYSTECTOMY N/A 10/16/2012   Procedure: LAPAROSCOPIC CHOLECYSTECTOMY;  Surgeon: Joyice Faster. Cornett, MD;  [facility];  Service: General;  Laterality: N/A;  . COLONOSCOPY     numerous times  . DILATION AND CURETTAGE OF UTERUS    . ESOPHAGOGASTRODUODENOSCOPY    . [place]   x3  . IUD REMOVAL  02/2015   Mirena  . LAPAROSCOPIC LYSIS OF ADHESIONS N/A 10/16/2012   Procedure: LAPAROSCOPIC LYSIS OF ADHESIONS;  Surgeon: Joyice Faster. Cornett, MD;  [facility];  Service: General;  Laterality: N/A;  . LAPAROSCOPY N/A 10/16/2012   Procedure: LAPAROSCOPY DIAGNOSTIC;  Surgeon: Joyice Faster. Cornett, MD;  [facility];  Service: General;  Laterality: N/A;  . PELVIC LAPAROSCOPY    . RADIOLOGY WITH ANESTHESIA N/A 12/16/2015   Procedure: MRI OF BRAIN WITH AND WITHOUT CONTRAST;  Surgeon: Medication Radiologist, MD;  Location: [facility];  Service: Radiology;  Laterality: N/A;  . SHOULDER SURGERY  2007/2008  . SPINE SURGERY  2010   cervical    There were no vitals filed for this visit.      Subjective Assessment - 05/15/16 1616    Subjective Went for two walks this weekend.  Was better after PT last time.  Is sore today.  Pertinent History fibromyalgia, cervical fusion, migrains.  Pt goes by "Danielle Bruce"   Limitations Reading;Walking   How long can you walk comfortably? 20-30 minutes   Patient Stated Goals walking   Pain Score 5    Pain Location Hip   Pain Orientation Left   Pain Descriptors / Indicators Aching   Pain Type Acute pain   Pain Onset More than a month ago   Pain Frequency Intermittent   Aggravating Factors  walking   Pain Relieving Factors rest, ice   Effect of Pain on Daily Activities walking            [facility] PT Assessment - 05/15/16 0001      Strength   Overall Strength Deficits  left hip abduction and adduction 3+/5     Palpation   Palpation comment tender along IT band and around  greater trochanter     Ambulation/Gait   Gait Pattern  Trendelenburg  left trendelenburg                     [facility] Adult PT Treatment/Exercise - 05/15/16 1618      Ambulation/Gait   Gait Pattern Trendelenburg  left trendelenburg     Knee/Hip Exercises: Aerobic   Stationary Bike L2x 5 min  cues for LE alignment     Modalities   Modalities Ultrasound     Ultrasound   Ultrasound Location left muscle tissue around greater trochanter   Ultrasound Parameters 20% duty, 1.3 intensity, 20mz   Ultrasound Goals Pain;Other (Comment)  decrease inflammation     Manual Therapy   Manual Therapy Soft tissue mobilization   Manual therapy comments right side lying   Soft tissue mobilization glutes and IT band                  PT Short Term Goals - 05/15/16 0701      PT SHORT TERM GOAL #1   Title be independent in initial HEP for LE   Time 4   Period Weeks   Status Revised     PT SHORT TERM GOAL #2   Title increased hip strength to 4/5 bilateral hip abduction and adduction for improved gait   Time 4   Period Weeks   Status New           PT Long Term Goals - 05/15/16 0703      PT LONG TERM GOAL #1   Title be independent in advanced HEP   Time 8   Period Weeks   Status On-going     PT LONG TERM GOAL #2   Title able to walk around the mall with her friend with 60% reduction in pain   Time 8   Period Weeks   Status New     PT LONG TERM GOAL #3   Title report 75% less HA throughout the day   Time 8   Period Weeks   Status Revised     PT LONG TERM GOAL #4   Title demonstrate 4+/5 bilateral hip strength without increased pain   Time 8   Period Weeks   Status New     PT LONG TERM GOAL #5   Title able to go up and down one flight of stairs without increased pain   Time 8   Period Weeks   Status New               Plan - 05/15/16 1721    Clinical Impression Statement Patient did well  with exercise and modalities.  Reports feeling reduced pain after treatment today.  Pt did not make  progress towards goals yet due to first visit after evaluation.  Pt will benefit from skilled PT to continue with pain management and strengthening.   Clinical Impairments Affecting Rehab Potential fibromyalgia, cervical fusions, migrains   PT Treatment/Interventions ADLs/Self Care Home Management;Cryotherapy;Electrical Stimulation;Moist Heat;Traction;Functional mobility training;Therapeutic activities;Therapeutic exercise;Balance training;Neuromuscular re-education;Patient/family education;Manual techniques;Passive range of motion;Dry needling;Taping;Ultrasound   PT Next Visit Plan ultrasound, manual, LE strength as tolerated, stretches   Consulted and Agree with Plan of Care Patient      Patient will benefit from skilled therapeutic intervention in order to improve the following deficits and impairments:  Increased muscle spasms, Increased fascial restricitons, Pain, Postural dysfunction, Abnormal gait, Decreased strength, Difficulty walking  Visit Diagnosis: Tension-type headache, not intractable, unspecified chronicity pattern  Muscle weakness (generalized)  Pain in left hip  Cramp and spasm  Abnormal posture       G-Codes - 2016/06/02 0634    Functional Assessment Tool Used (Outpatient Only) FOTO and clinical judgement   Functional Limitation Mobility: Walking and moving around   Mobility: Walking and Moving Around Current Status [phone]) At least 40 percent but less than 60 percent impaired, limited or restricted   Mobility: Walking and Moving Around Goal Status [phone]) At least 20 percent but less than 40 percent impaired, limited or restricted      Problem List Patient Active Problem List   Diagnosis Date Noted  . Fibromyalgia 01/13/2016  . MGUS (monoclonal gammopathy of unknown significance) 12/23/2013  . Chronic cholecystitis without calculus 10/18/2012  . Abdominal pain, unspecified site 10/18/2012  . Nausea alone 10/18/2012  . Unspecified constipation 10/18/2012  .  Depression 10/18/2012  . Bipolar 2 disorder ([place]) 10/18/2012  . Anxiety   . ASCUS (atypical squamous cells of undetermined significance) on Pap smear   . IUD   . Hemorrhoids 11/29/2010  . Abdominal pain, left upper quadrant 11/29/2010    Zannie Cove, PT 2016-06-02, 5:26 PM  [facility] [address] [place], [place], [address] Phone: [phone]   Fax:  [phone]  Name: CEILI BOSHERS MRN: [id number] Date of Birth: [birth year]

## 2016-05-16 DIAGNOSIS — F3181 Bipolar II disorder: Secondary | ICD-10-CM | POA: Diagnosis not present

## 2016-05-16 DIAGNOSIS — M7072 Other bursitis of hip, left hip: Secondary | ICD-10-CM | POA: Diagnosis not present

## 2016-05-16 DIAGNOSIS — J309 Allergic rhinitis, unspecified: Secondary | ICD-10-CM | POA: Diagnosis not present

## 2016-05-16 DIAGNOSIS — K589 Irritable bowel syndrome without diarrhea: Secondary | ICD-10-CM | POA: Diagnosis not present

## 2016-05-16 DIAGNOSIS — G43909 Migraine, unspecified, not intractable, without status migrainosus: Secondary | ICD-10-CM | POA: Diagnosis not present

## 2016-05-16 DIAGNOSIS — F900 Attention-deficit hyperactivity disorder, predominantly inattentive type: Secondary | ICD-10-CM | POA: Diagnosis not present

## 2016-05-17 ENCOUNTER — Encounter: Payer: Self-pay | Admitting: Physical Therapy

## 2016-05-17 ENCOUNTER — Ambulatory Visit: Payer: Medicare Other | Admitting: Physical Therapy

## 2016-05-17 DIAGNOSIS — M25552 Pain in left hip: Secondary | ICD-10-CM | POA: Diagnosis not present

## 2016-05-17 DIAGNOSIS — R252 Cramp and spasm: Secondary | ICD-10-CM

## 2016-05-17 DIAGNOSIS — G44209 Tension-type headache, unspecified, not intractable: Secondary | ICD-10-CM

## 2016-05-17 DIAGNOSIS — R293 Abnormal posture: Secondary | ICD-10-CM

## 2016-05-17 DIAGNOSIS — M6281 Muscle weakness (generalized): Secondary | ICD-10-CM | POA: Diagnosis not present

## 2016-05-17 NOTE — Therapy (Signed)
[facility] [address] [place], [place], [address] Phone: [phone]   Fax:  [phone]  Physical Therapy Treatment  Patient Details  Name: Danielle Bruce MRN: [id number] Date of Birth: [birth year] Referring Provider: Mayra Neer, MD  Encounter Date: 05/17/2016      PT End of Session - 05/17/16 1454    Visit Number 15   Number of Visits 20   Date for PT Re-Evaluation 07/05/16   Authorization Type Gcodes at 20th visit; KX at 48   PT Start Time 1449   PT Stop Time 1532   PT Time Calculation (min) 43 min   Activity Tolerance Patient tolerated treatment well   Behavior During Therapy [facility] for tasks assessed/performed      Past Medical History:  Diagnosis Date  . Anemia   . Anxiety   . Arthritis    osteoarthritis  . ASCUS (atypical squamous cells of undetermined significance) on Pap smear 05/06/05   NEG HIGH RISK HPV--C&B BIOPSY BENIGN 12/2005  . Asthma   . Bipolar 2 disorder ([place])   . Cancer ([place])    skin cancer - basal cell  . Colon polyps   . Complication of anesthesia    anxious afterwards, will get headaches   . Constipation   . Depression   . Fibromyalgia 10/2013  . GERD (gastroesophageal reflux disease)   . 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 Heart murmur    never had any problems  . Hemorrhoids   . High cholesterol   . 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   cytology negative  . IBS (irritable bowel syndrome)   . Insomnia   . Lymphocytic colitis   . 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 Migraines   . Osteoarthritis   . Osteopenia   . Peripheral neuropathy ([place])   . PTSD (post-traumatic stress disorder)     Past Surgical History:  Procedure Laterality Date  . BONE MARROW BIOPSY Left 12/18/2013   Plasma cell dyscrasia 8% population of plasma cells  . BREAST BIOPSY Right    benign stereo  . CESAREAN SECTION   [phone]  . CHOLECYSTECTOMY N/A 10/16/2012   Procedure: LAPAROSCOPIC CHOLECYSTECTOMY;  Surgeon: Joyice Faster. Cornett, MD;  [facility];  Service: General;  Laterality: N/A;  . COLONOSCOPY     numerous times  . DILATION AND CURETTAGE OF UTERUS    . ESOPHAGOGASTRODUODENOSCOPY    . [place]   x3  . IUD REMOVAL  02/2015   Mirena  . LAPAROSCOPIC LYSIS OF ADHESIONS N/A 10/16/2012   Procedure: LAPAROSCOPIC LYSIS OF ADHESIONS;  Surgeon: Joyice Faster. Cornett, MD;  [facility];  Service: General;  Laterality: N/A;  . LAPAROSCOPY N/A 10/16/2012   Procedure: LAPAROSCOPY DIAGNOSTIC;  Surgeon: Joyice Faster. Cornett, MD;  [facility];  Service: General;  Laterality: N/A;  . PELVIC LAPAROSCOPY    . RADIOLOGY WITH ANESTHESIA N/A 12/16/2015   Procedure: MRI OF BRAIN WITH AND WITHOUT CONTRAST;  Surgeon: Medication Radiologist, MD;  Location: [place];  Service: Radiology;  Laterality: N/A;  . SHOULDER SURGERY  2007/2008  . SPINE SURGERY  2010   cervical    There were no vitals filed for this visit.      Subjective Assessment - 05/17/16 1500    Subjective walked at [employer] after last session and flared up.  Felt fine the next day.  Pertinent History fibromyalgia, cervical fusion, migrains.  Pt goes by "[name]"   Limitations Reading;Walking   How long can you walk comfortably? 20-30 minutes   Currently in Pain? Yes   Pain Score 2    Pain Location Hip   Pain Orientation Left   Pain Descriptors / Indicators Aching   Pain Type Acute pain   Pain Onset More than a month ago   Pain Frequency Intermittent   Aggravating Factors  walking   Pain Relieving Factors rest, ice   Effect of Pain on Daily Activities walking for exercise   Multiple Pain Sites No                         [facility] Adult PT Treatment/Exercise - 05/17/16 0001      Knee/Hip Exercises: Stretches   Piriformis Stretch Right;Left;2 reps;20 seconds  sitting     Knee/Hip Exercises: Aerobic    Elliptical incline 5; 6 min     Knee/Hip Exercises: Sidelying   Clams 2x10 left hip     Ultrasound   Ultrasound Location left muscle attatchment around GT left hip   Ultrasound Parameters 20%, 1.3, 1MHz   Ultrasound Goals Pain     Manual Therapy   Manual Therapy Soft tissue mobilization   Manual therapy comments right side lying   Soft tissue mobilization glutes and IT band                PT Education - 05/17/16 1542    Education provided Yes   Education Details piriformis stretch, clam   Person(s) Educated Patient   Methods Explanation;Demonstration;Verbal cues;Handout   Comprehension Verbalized understanding;Returned demonstration          PT Short Term Goals - 05/15/16 0701      PT SHORT TERM GOAL #1   Title be independent in initial HEP for LE   Time 4   Period Weeks   Status Revised     PT SHORT TERM GOAL #2   Title increased hip strength to 4/5 bilateral hip abduction and adduction for improved gait   Time 4   Period Weeks   Status New           PT Long Term Goals - 05/15/16 0703      PT LONG TERM GOAL #1   Title be independent in advanced HEP   Time 8   Period Weeks   Status On-going     PT LONG TERM GOAL #2   Title able to walk around the mall with her friend with 60% reduction in pain   Time 8   Period Weeks   Status New     PT LONG TERM GOAL #3   Title report 75% less HA throughout the day   Time 8   Period Weeks   Status Revised     PT LONG TERM GOAL #4   Title demonstrate 4+/5 bilateral hip strength without increased pain   Time 8   Period Weeks   Status New     PT LONG TERM GOAL #5   Title able to go up and down one flight of stairs without increased pain   Time 8   Period Weeks   Status New               Plan - 05/17/16 1542    Clinical Impression Statement Patient tolerated exercises well and was able to add to HEP.  Continues to have a lot of muscle  spasm and very tender to attachments around greater  trochanter.  Skilled PT needed for reduced pain and improved strength for ambulation.   Rehab Potential Excellent   Clinical Impairments Affecting Rehab Potential fibromyalgia, cervical fusions, migrains   PT Treatment/Interventions ADLs/Self Care Home Management;Cryotherapy;Electrical Stimulation;Moist Heat;Traction;Functional mobility training;Therapeutic activities;Therapeutic exercise;Balance training;Neuromuscular re-education;Patient/family education;Manual techniques;Passive range of motion;Dry needling;Taping;Ultrasound   PT Next Visit Plan ultrasound, manual, LE strength as tolerated, stretches   PT Home Exercise Plan progress as needed   Consulted and Agree with Plan of Care Patient      Patient will benefit from skilled therapeutic intervention in order to improve the following deficits and impairments:  Increased muscle spasms, Increased fascial restricitons, Pain, Postural dysfunction, Abnormal gait, Decreased strength, Difficulty walking  Visit Diagnosis: Tension-type headache, not intractable, unspecified chronicity pattern  Muscle weakness (generalized)  Pain in left hip  Cramp and spasm  Abnormal posture     Problem List Patient Active Problem List   Diagnosis Date Noted  . Fibromyalgia 01/13/2016  . MGUS (monoclonal gammopathy of unknown significance) 12/23/2013  . Chronic cholecystitis without calculus 10/18/2012  . Abdominal pain, unspecified site 10/18/2012  . Nausea alone 10/18/2012  . Unspecified constipation 10/18/2012  . Depression 10/18/2012  . Bipolar 2 disorder ([place]) 10/18/2012  . Anxiety   . ASCUS (atypical squamous cells of undetermined significance) on Pap smear   . IUD   . Hemorrhoids 11/29/2010  . Abdominal pain, left upper quadrant 11/29/2010    Zannie Cove, PT 05/17/2016, 3:46 PM  [facility] [address], [place], [place], [address] Phone: [phone]   Fax:   [phone]  Name: Danielle Bruce MRN: [id number] Date of Birth: 1960-03-26

## 2016-05-17 NOTE — Patient Instructions (Addendum)
Piriformis Stretch, Sitting    Sit, one ankle on opposite knee, same-side hand on crossed knee. Push down on knee, keeping spine straight. Lean torso forward, with flat back, until tension is felt in hamstrings and gluteals of crossed-leg side. Hold _20__ seconds.  Repeat __3_ times per session. Do _1__ sessions per day.  Copyright  VHI. All rights reserved.   Abduction: Clam (Eccentric) - Side-Lying    Lie on side with knees bent. Lift top knee, keeping feet together. Keep trunk steady. Slowly lower for 3-5 seconds. __20_ reps per set, _1__ sets per day  http://ecce.exer.us/65   Copyright  VHI. All rights reserved.

## 2016-05-22 ENCOUNTER — Encounter: Payer: Medicare Other | Admitting: Physical Therapy

## 2016-05-22 DIAGNOSIS — F3113 Bipolar disorder, current episode manic without psychotic features, severe: Secondary | ICD-10-CM | POA: Diagnosis not present

## 2016-05-24 ENCOUNTER — Ambulatory Visit: Payer: Medicare Other | Admitting: Physical Therapy

## 2016-05-24 ENCOUNTER — Encounter: Payer: Self-pay | Admitting: Physical Therapy

## 2016-05-24 DIAGNOSIS — G44209 Tension-type headache, unspecified, not intractable: Secondary | ICD-10-CM | POA: Diagnosis not present

## 2016-05-24 DIAGNOSIS — J3081 Allergic rhinitis due to animal (cat) (dog) hair and dander: Secondary | ICD-10-CM | POA: Diagnosis not present

## 2016-05-24 DIAGNOSIS — R252 Cramp and spasm: Secondary | ICD-10-CM

## 2016-05-24 DIAGNOSIS — M25552 Pain in left hip: Secondary | ICD-10-CM | POA: Diagnosis not present

## 2016-05-24 DIAGNOSIS — R293 Abnormal posture: Secondary | ICD-10-CM | POA: Diagnosis not present

## 2016-05-24 DIAGNOSIS — J301 Allergic rhinitis due to pollen: Secondary | ICD-10-CM | POA: Diagnosis not present

## 2016-05-24 DIAGNOSIS — M6281 Muscle weakness (generalized): Secondary | ICD-10-CM

## 2016-05-24 DIAGNOSIS — J3089 Other allergic rhinitis: Secondary | ICD-10-CM | POA: Diagnosis not present

## 2016-05-24 NOTE — Therapy (Signed)
[facility] [address] [place], , [address] Phone: [phone]   Fax:  [phone]  Physical Therapy Treatment  Patient Details  Name: Danielle Bruce MRN: [id number] Date of Birth: 11/18/1960 Referring Provider: Shaw, Kimberlee, MD  Encounter Date: 05/24/2016      PT End of Session - 05/24/16 1533    Visit Number 16   Number of Visits 20   Date for PT Re-Evaluation 07/05/16   Authorization Type Gcodes at 20th visit; KX at 15   PT Start Time 1449   PT Stop Time 1527   PT Time Calculation (min) 38 min   Activity Tolerance Patient tolerated treatment well   Behavior During Therapy [facility] for tasks assessed/performed      Past Medical History:  Diagnosis Date  . Anemia   . Anxiety   . Arthritis    osteoarthritis  . ASCUS (atypical squamous cells of undetermined significance) on Pap smear 05/06/05   NEG HIGH RISK HPV--C&B BIOPSY BENIGN 12/2005  . Asthma   . Bipolar 2 disorder ([facility])   . Cancer ([facility])    skin cancer - basal cell  . Colon polyps   . Complication of anesthesia    anxious afterwards, will get headaches   . Constipation   . Depression   . Fibromyalgia 10/2013  . GERD (gastroesophageal reflux disease)   . 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 Heart murmur    never had any problems  . Hemorrhoids   . High cholesterol   . 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   cytology negative  . IBS (irritable bowel syndrome)   . Insomnia   . Lymphocytic colitis   . 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 Migraines   . Osteoarthritis   . Osteopenia   . Peripheral neuropathy ([facility])   . PTSD (post-traumatic stress disorder)     Past Surgical History:  Procedure Laterality Date  . BONE MARROW BIOPSY Left 12/18/2013   Plasma cell dyscrasia 8% population of plasma cells  . BREAST BIOPSY Right    benign stereo  . CESAREAN SECTION   86,88,93  . CHOLECYSTECTOMY N/A 10/16/2012   Procedure: LAPAROSCOPIC CHOLECYSTECTOMY;  Surgeon: Thomas A. Cornett, MD;  [facility];  Service: General;  Laterality: N/A;  . COLONOSCOPY     numerous times  . DILATION AND CURETTAGE OF UTERUS    . ESOPHAGOGASTRODUODENOSCOPY    . HEMORRHOID SURGERY  1993   x3  . IUD REMOVAL  02/2015   Mirena  . LAPAROSCOPIC LYSIS OF ADHESIONS N/A 10/16/2012   Procedure: LAPAROSCOPIC LYSIS OF ADHESIONS;  Surgeon: Thomas A. Cornett, MD;  [facility];  Service: General;  Laterality: N/A;  . LAPAROSCOPY N/A 10/16/2012   Procedure: LAPAROSCOPY DIAGNOSTIC;  Surgeon: Thomas A. Cornett, MD;  [facility];  Service: General;  Laterality: N/A;  . PELVIC LAPAROSCOPY    . RADIOLOGY WITH ANESTHESIA N/A 12/16/2015   Procedure: MRI OF BRAIN WITH AND WITHOUT CONTRAST;  Surgeon: Medication Radiologist, MD;  [facility];  Service: Radiology;  Laterality: N/A;  . SHOULDER SURGERY  2007/2008  . SPINE SURGERY  2010   cervical    There were no vitals filed for this visit.      Subjective Assessment - 05/24/16 1454    Subjective Walked outside today and started having pain but was able to walk a good distance if walking   very slowly.   Pertinent History fibromyalgia, cervical fusion, migrains.  Pt goes by "[name]"   Limitations Reading;Walking   How long can you walk comfortably? --   Currently in Pain? Yes   Pain Score 3    Pain Location Hip   Pain Orientation Left   Pain Descriptors / Indicators Aching   Pain Type Acute pain   Pain Onset More than a month ago   Pain Frequency Intermittent   Aggravating Factors  walking   Pain Relieving Factors rest, ice   Effect of Pain on Daily Activities exercise and stairs   Multiple Pain Sites No                         [facility] Adult PT Treatment/Exercise - 05/24/16 0001      Knee/Hip Exercises: Stretches   Piriformis Stretch Right;Left;20 seconds;3 reps  sitting     Knee/Hip Exercises:  Aerobic   Elliptical incline 5; 5 min     Knee/Hip Exercises: Standing   Hip Abduction Stengthening;Both;2 sets;10 reps;Knee straight   Hip Extension Stengthening;Right;Left;2 sets;10 reps;Knee straight   Other Standing Knee Exercises standing hip extension at angle - 45 deg     Knee/Hip Exercises: Sidelying   Hip ADduction Strengthening;Left;10 reps   Clams 2x10 left hip     Iontophoresis   Type of Iontophoresis Dexamethasone   Location left greater trochanteric bursa   Dose 1 mL   Time 6 hour release     Manual Therapy   Manual Therapy Soft tissue mobilization   Manual therapy comments right side lying   Soft tissue mobilization glutes and IT band                  PT Short Term Goals - 05/24/16 1927      PT SHORT TERM GOAL #1   Title be independent in initial HEP for LE   Time 4   Period Weeks   Status On-going     PT SHORT TERM GOAL #2   Title increased hip strength to 4/5 bilateral hip abduction and adduction for improved gait   Time 4   Period Weeks   Status On-going           PT Long Term Goals - 05/24/16 1929      PT LONG TERM GOAL #1   Title be independent in advanced HEP   Time 8   Period Weeks   Status On-going     PT LONG TERM GOAL #2   Title able to walk around the mall with her friend with 60% reduction in pain   Time 8   Period Weeks   Status On-going     PT LONG TERM GOAL #3   Title report 75% less HA throughout the day   Time 8   Period Weeks   Status On-going     PT LONG TERM GOAL #4   Title demonstrate 4+/5 bilateral hip strength without increased pain   Time 8   Period Weeks   Status On-going     PT LONG TERM GOAL #5   Title able to go up and down one flight of stairs without increased pain   Time 8   Period Weeks   Status On-going               Plan - 05/24/16 1529    Clinical Impression Statement Pt tolerated increased stnding exercises.  Continues to have a lot of tenderness  on left GT bursa.  Pt  started ionto today.  Continues to need skilled PT for left hip strength and work on functional activities such as stairs   Rehab Potential Excellent   Clinical Impairments Affecting Rehab Potential fibromyalgia, cervical fusions, migrains   PT Treatment/Interventions ADLs/Self Care Home Management;Cryotherapy;Electrical Stimulation;Moist Heat;Traction;Functional mobility training;Therapeutic activities;Therapeutic exercise;Balance training;Neuromuscular re-education;Patient/family education;Manual techniques;Passive range of motion;Dry needling;Taping;Ultrasound   PT Next Visit Plan f/u on ionto #1 and do #2 as tolerated, LE strength and manual to IT band and glutes left.   PT Home Exercise Plan progress as needed   Consulted and Agree with Plan of Care Patient      Patient will benefit from skilled therapeutic intervention in order to improve the following deficits and impairments:  Increased muscle spasms, Increased fascial restricitons, Pain, Postural dysfunction, Abnormal gait, Decreased strength, Difficulty walking  Visit Diagnosis: Tension-type headache, not intractable, unspecified chronicity pattern  Muscle weakness (generalized)  Pain in left hip  Cramp and spasm  Abnormal posture     Problem List Patient Active Problem List   Diagnosis Date Noted  . Fibromyalgia 01/13/2016  . MGUS (monoclonal gammopathy of unknown significance) 12/23/2013  . Chronic cholecystitis without calculus 10/18/2012  . Abdominal pain, unspecified site 10/18/2012  . Nausea alone 10/18/2012  . Unspecified constipation 10/18/2012  . Depression 10/18/2012  . Bipolar 2 disorder ([place]) 10/18/2012  . Anxiety   . ASCUS (atypical squamous cells of undetermined significance) on Pap smear   . IUD   . Hemorrhoids 11/29/2010  . Abdominal pain, left upper quadrant 11/29/2010    Zannie Cove, PT 05/24/2016, 9:27 PM  [facility] [address]  [place], [place], [address] Phone: [phone]   Fax:  [phone]  Name: Danielle Bruce MRN: [id number] Date of Birth: 1960-09-30

## 2016-05-29 ENCOUNTER — Ambulatory Visit: Payer: Medicare Other | Admitting: Physical Therapy

## 2016-05-31 ENCOUNTER — Ambulatory Visit: Payer: Medicare Other | Attending: Family Medicine | Admitting: Physical Therapy

## 2016-05-31 DIAGNOSIS — R252 Cramp and spasm: Secondary | ICD-10-CM | POA: Diagnosis not present

## 2016-05-31 DIAGNOSIS — M6281 Muscle weakness (generalized): Secondary | ICD-10-CM | POA: Diagnosis not present

## 2016-05-31 DIAGNOSIS — J3081 Allergic rhinitis due to animal (cat) (dog) hair and dander: Secondary | ICD-10-CM | POA: Diagnosis not present

## 2016-05-31 DIAGNOSIS — J301 Allergic rhinitis due to pollen: Secondary | ICD-10-CM | POA: Diagnosis not present

## 2016-05-31 DIAGNOSIS — G44209 Tension-type headache, unspecified, not intractable: Secondary | ICD-10-CM | POA: Insufficient documentation

## 2016-05-31 DIAGNOSIS — M25552 Pain in left hip: Secondary | ICD-10-CM | POA: Diagnosis not present

## 2016-05-31 DIAGNOSIS — R293 Abnormal posture: Secondary | ICD-10-CM

## 2016-05-31 DIAGNOSIS — J3089 Other allergic rhinitis: Secondary | ICD-10-CM | POA: Diagnosis not present

## 2016-05-31 NOTE — Therapy (Signed)
[facility] [address] [place], [place], [address] Phone: [phone]   Fax:  [phone]  Physical Therapy Treatment  Patient Details  Name: Danielle Bruce MRN: [id number] Date of Birth: 05-25-1960 Referring Provider: Mayra Neer, MD  Encounter Date: 05/31/2016      PT End of Session - 05/31/16 1542    Visit Number 17   Number of Visits 20   Date for PT Re-Evaluation 07/05/16   Authorization Type Gcodes at 20th visit; KX at 20   PT Start Time 1450   PT Stop Time 1530   PT Time Calculation (min) 40 min   Activity Tolerance Patient tolerated treatment well   Behavior During Therapy [facility] for tasks assessed/performed      Past Medical History:  Diagnosis Date  . Anemia   . Anxiety   . Arthritis    osteoarthritis  . ASCUS (atypical squamous cells of undetermined significance) on Pap smear 05/06/05   NEG HIGH RISK HPV--C&B BIOPSY BENIGN 12/2005  . Asthma   . Bipolar 2 disorder ([place])   . Cancer ([place])    skin cancer - basal cell  . Colon polyps   . Complication of anesthesia    anxious afterwards, will get headaches   . Constipation   . Depression   . Fibromyalgia 10/2013  . GERD (gastroesophageal reflux disease)   . 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 Heart murmur    never had any problems  . Hemorrhoids   . High cholesterol   . 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   cytology negative  . IBS (irritable bowel syndrome)   . Insomnia   . Lymphocytic colitis   . 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 Migraines   . Osteoarthritis   . Osteopenia   . Peripheral neuropathy ([place])   . PTSD (post-traumatic stress disorder)     Past Surgical History:  Procedure Laterality Date  . BONE MARROW BIOPSY Left 12/18/2013   Plasma cell dyscrasia 8% population of plasma cells  . BREAST BIOPSY Right    benign stereo  . CESAREAN SECTION   [phone]  . CHOLECYSTECTOMY N/A 10/16/2012   Procedure: LAPAROSCOPIC CHOLECYSTECTOMY;  Surgeon: Joyice Faster. Cornett, MD;  [facility];  Service: General;  Laterality: N/A;  . COLONOSCOPY     numerous times  . DILATION AND CURETTAGE OF UTERUS    . ESOPHAGOGASTRODUODENOSCOPY    . [place]   x3  . IUD REMOVAL  02/2015   Mirena  . LAPAROSCOPIC LYSIS OF ADHESIONS N/A 10/16/2012   Procedure: LAPAROSCOPIC LYSIS OF ADHESIONS;  Surgeon: Joyice Faster. Cornett, MD;  [facility];  Service: General;  Laterality: N/A;  . LAPAROSCOPY N/A 10/16/2012   Procedure: LAPAROSCOPY DIAGNOSTIC;  Surgeon: Joyice Faster. Cornett, MD;  [facility];  Service: General;  Laterality: N/A;  . PELVIC LAPAROSCOPY    . RADIOLOGY WITH ANESTHESIA N/A 12/16/2015   Procedure: MRI OF BRAIN WITH AND WITHOUT CONTRAST;  Surgeon: Medication Radiologist, MD;  Location: [place];  Service: Radiology;  Laterality: N/A;  . SHOULDER SURGERY  2007/2008  . SPINE SURGERY  2010   cervical    There were no vitals filed for this visit.      Subjective Assessment - 05/31/16 1552    Subjective The patch helped a lot.  I was able to walk outside all day the other day  without pain.   Pertinent History fibromyalgia, cervical fusion, migrains.  Pt goes by "[name]"   Currently in Pain? No/denies                         [facility] Adult PT Treatment/Exercise - 05/31/16 0001      Knee/Hip Exercises: Stretches   Active Hamstring Stretch 3 reps;20 seconds   Piriformis Stretch Right;Left;20 seconds;3 reps  sitting     Knee/Hip Exercises: Aerobic   Nustep L1 x 10 min     Knee/Hip Exercises: Standing   Knee Flexion Strengthening;Both;2 sets;10 reps   Hip Abduction Stengthening;Both;2 sets;10 reps;Knee straight   Hip Extension Stengthening;Right;Left;2 sets;10 reps;Knee straight     Ultrasound   Ultrasound Location left muscle attachment around GT of hip   Ultrasound Parameters 20% duty, 1.3 W/cm, 1 MHz,     Ultrasound Goals Pain     Iontophoresis   Type of Iontophoresis Dexamethasone   Location left greater trochanteric bursa   Dose 1 mL   Time 6 hour release                  PT Short Term Goals - 05/31/16 1549      PT SHORT TERM GOAL #1   Title be independent in initial HEP for LE   Time 4   Period Weeks   Status Achieved     PT SHORT TERM GOAL #2   Title increased hip strength to 4/5 bilateral hip abduction and adduction for improved gait   Time 4   Period Weeks   Status On-going           PT Long Term Goals - 05/31/16 1549      PT LONG TERM GOAL #1   Title be independent in advanced HEP   Time 8   Period Weeks   Status On-going     PT LONG TERM GOAL #2   Title able to walk around the mall with her friend with 60% reduction in pain   Baseline walked 1100 steps the other day without increased hip pain   Time 8   Period Weeks   Status On-going     PT LONG TERM GOAL #4   Title demonstrate 4+/5 bilateral hip strength without increased pain   Time 8   Period Weeks   Status On-going               Plan - 05/31/16 1547    Clinical Impression Statement Patient tolerated more standing exercises.  Pt making progress with decreased pain and increased tolerance for standing and walking.  Skilled PT needed for increased LE strength in order to return to maximum function.   Rehab Potential Excellent   Clinical Impairments Affecting Rehab Potential fibromyalgia, cervical fusions, migrains   PT Treatment/Interventions ADLs/Self Care Home Management;Cryotherapy;Electrical Stimulation;Moist Heat;Traction;Functional mobility training;Therapeutic activities;Therapeutic exercise;Balance training;Neuromuscular re-education;Patient/family education;Manual techniques;Passive range of motion;Dry needling;Taping;Ultrasound   PT Next Visit Plan ionto #3, LE strengthening as tolerated, ultrasound and manual if needed, small step ups      Patient will benefit from  skilled therapeutic intervention in order to improve the following deficits and impairments:  Increased muscle spasms, Increased fascial restricitons, Pain, Postural dysfunction, Abnormal gait, Decreased strength, Difficulty walking  Visit Diagnosis: Muscle weakness (generalized)  Pain in left hip  Cramp and spasm  Abnormal posture     Problem List Patient Active Problem List   Diagnosis Date Noted  . Fibromyalgia 01/13/2016  . MGUS (  monoclonal gammopathy of unknown significance) 12/23/2013  . Chronic cholecystitis without calculus 10/18/2012  . Abdominal pain, unspecified site 10/18/2012  . Nausea alone 10/18/2012  . Unspecified constipation 10/18/2012  . Depression 10/18/2012  . Bipolar 2 disorder ([place]) 10/18/2012  . Anxiety   . ASCUS (atypical squamous cells of undetermined significance) on Pap smear   . IUD   . Hemorrhoids 11/29/2010  . Abdominal pain, left upper quadrant 11/29/2010    Zannie Cove, PT 05/31/2016, 3:53 PM  [facility] [address] [place], [place], [address] Phone: [phone]   Fax:  [phone]  Name: Danielle Bruce MRN: [id number] Date of Birth: 04-Feb-1961

## 2016-06-05 ENCOUNTER — Ambulatory Visit: Payer: Medicare Other | Admitting: Physical Therapy

## 2016-06-05 DIAGNOSIS — J3089 Other allergic rhinitis: Secondary | ICD-10-CM | POA: Diagnosis not present

## 2016-06-05 DIAGNOSIS — M25552 Pain in left hip: Secondary | ICD-10-CM | POA: Diagnosis not present

## 2016-06-05 DIAGNOSIS — R252 Cramp and spasm: Secondary | ICD-10-CM | POA: Diagnosis not present

## 2016-06-05 DIAGNOSIS — J301 Allergic rhinitis due to pollen: Secondary | ICD-10-CM | POA: Diagnosis not present

## 2016-06-05 DIAGNOSIS — R293 Abnormal posture: Secondary | ICD-10-CM | POA: Diagnosis not present

## 2016-06-05 DIAGNOSIS — J3081 Allergic rhinitis due to animal (cat) (dog) hair and dander: Secondary | ICD-10-CM | POA: Diagnosis not present

## 2016-06-05 DIAGNOSIS — M6281 Muscle weakness (generalized): Secondary | ICD-10-CM

## 2016-06-05 DIAGNOSIS — G44209 Tension-type headache, unspecified, not intractable: Secondary | ICD-10-CM | POA: Diagnosis not present

## 2016-06-05 NOTE — Therapy (Signed)
[facility] [address] [place], [place] Phone: [phone]   Fax:  [phone]  Physical Therapy Treatment  Patient Details  Name: Danielle Bruce MRN: [id number] Date of Birth: Jan 04, 1961 Referring Provider: Mayra Neer, MD  Encounter Date: 06/05/2016      PT End of Session - 06/05/16 1456    Visit Number 18   Number of Visits 20   Date for PT Re-Evaluation 07/05/16   Authorization Type Gcodes at 20th visit; KX at 29   PT Start Time 1456   PT Stop Time 1530   PT Time Calculation (min) 34 min   Activity Tolerance Patient tolerated treatment well   Behavior During Therapy [facility] for tasks assessed/performed      Past Medical History:  Diagnosis Date  . Anemia   . Anxiety   . Arthritis    osteoarthritis  . ASCUS (atypical squamous cells of undetermined significance) on Pap smear 05/06/05   NEG HIGH RISK HPV--C&B BIOPSY BENIGN 12/2005  . Asthma   . Bipolar 2 disorder ([place])   . Cancer ([place])    skin cancer - basal cell  . Colon polyps   . Complication of anesthesia    anxious afterwards, will get headaches   . Constipation   . Depression   . Fibromyalgia 10/2013  . GERD (gastroesophageal reflux disease)   . 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 Heart murmur    never had any problems  . Hemorrhoids   . High cholesterol   . 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   cytology negative  . IBS (irritable bowel syndrome)   . Insomnia   . Lymphocytic colitis   . 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 Migraines   . Osteoarthritis   . Osteopenia   . Peripheral neuropathy ([place])   . PTSD (post-traumatic stress disorder)     Past Surgical History:  Procedure Laterality Date  . BONE MARROW BIOPSY Left 12/18/2013   Plasma cell dyscrasia 8% population of plasma cells  . BREAST BIOPSY Right    benign stereo  . CESAREAN SECTION   [phone]  . CHOLECYSTECTOMY N/A 10/16/2012   Procedure: LAPAROSCOPIC CHOLECYSTECTOMY;  Surgeon: Joyice Faster. Cornett, MD;  [facility];  Service: General;  Laterality: N/A;  . COLONOSCOPY     numerous times  . DILATION AND CURETTAGE OF UTERUS    . ESOPHAGOGASTRODUODENOSCOPY    . [place]   x3  . IUD REMOVAL  02/2015   Mirena  . LAPAROSCOPIC LYSIS OF ADHESIONS N/A 10/16/2012   Procedure: LAPAROSCOPIC LYSIS OF ADHESIONS;  Surgeon: Joyice Faster. Cornett, MD;  [facility];  Service: General;  Laterality: N/A;  . LAPAROSCOPY N/A 10/16/2012   Procedure: LAPAROSCOPY DIAGNOSTIC;  Surgeon: Joyice Faster. Cornett, MD;  [facility];  Service: General;  Laterality: N/A;  . PELVIC LAPAROSCOPY    . RADIOLOGY WITH ANESTHESIA N/A 12/16/2015   Procedure: MRI OF BRAIN WITH AND WITHOUT CONTRAST;  Surgeon: Medication Radiologist, MD;  Location: [place];  Service: Radiology;  Laterality: N/A;  . SHOULDER SURGERY  2007/2008  . SPINE SURGERY  2010   cervical    There were no vitals filed for this visit.      Subjective Assessment - 06/05/16 1457    Subjective Pt arrived late today. states the patch helped a lot.  States she has not been feeling  great so has not been doing much lately, but denies pain.   Pertinent History fibromyalgia, cervical fusion, migrains.  Pt goes by "[name]"   Limitations Reading;Walking   Patient Stated Goals walking   Currently in Pain? No/denies                         [facility] Adult PT Treatment/Exercise - 06/05/16 0001      Knee/Hip Exercises: Stretches   Active Hamstring Stretch 3 reps;20 seconds   Piriformis Stretch Right;Left;20 seconds;3 reps  sitting   Gastroc Stretch Both;3 reps;30 seconds     Knee/Hip Exercises: Aerobic   Nustep L1 x 10 min (1000 strides)     Knee/Hip Exercises: Standing   Knee Flexion Strengthening;Both;2 sets;10 reps  2#   Hip Abduction Stengthening;Both;2 sets;10 reps;Knee straight  2#   Hip Extension  Stengthening;Right;Left;2 sets;10 reps;Knee straight  2#   Forward Step Up 20 reps;Hand Hold: 2;Left  2#   Other Standing Knee Exercises glute med drops on step - 10x bilateral     Knee/Hip Exercises: Supine   Straight Leg Raises Strengthening;Left;10 reps  hip series 4 ways     Iontophoresis   Type of Iontophoresis Dexamethasone   Location left greater trochanteric bursa   Dose 1 mL   Time 6 hour release                  PT Short Term Goals - 06/05/16 1748      PT SHORT TERM GOAL #2   Title increased hip strength to 4/5 bilateral hip abduction and adduction for improved gait   Baseline ...   Time 4   Period Weeks   Status On-going           PT Long Term Goals - 06/05/16 1748      PT LONG TERM GOAL #1   Title be independent in advanced HEP   Time 8   Period Weeks   Status On-going     PT LONG TERM GOAL #2   Title able to walk around the mall with her friend with 60% reduction in pain   Time 8   Period Weeks   Status On-going     PT LONG TERM GOAL #3   Title report 75% less HA throughout the day   Baseline reports no issues with daily headaches anymore   Time 8   Period Weeks   Status Achieved     PT LONG TERM GOAL #4   Title demonstrate 4+/5 bilateral hip strength without increased pain   Time 8   Period Weeks   Status On-going     PT LONG TERM GOAL #5   Title able to go up and down one flight of stairs without increased pain   Time 8   Period Weeks   Status On-going               Plan - 06/05/16 1459    Clinical Impression Statement Pt tolerated more difficult exercises with minor discomfort.  She continues to demonstrate weakness in Lt hip.  Able to progress to stairs and performed ionto treatment #3 today.  Pt continues to benefit form skilled PT for pain management and hip strengthening.   Rehab Potential Excellent   Clinical Impairments Affecting Rehab Potential fibromyalgia, cervical fusions, migrains   PT  Treatment/Interventions ADLs/Self Care Home Management;Cryotherapy;Electrical Stimulation;Moist Heat;Traction;Functional mobility training;Therapeutic activities;Therapeutic exercise;Balance training;Neuromuscular re-education;Patient/family education;Manual techniques;Passive range of motion;Dry needling;Taping;Ultrasound   PT Next  Visit Plan ionto #4, LE strengthening as tolerated, cont standding and step ups   Consulted and Agree with Plan of Care Patient      Patient will benefit from skilled therapeutic intervention in order to improve the following deficits and impairments:  Increased muscle spasms, Increased fascial restricitons, Pain, Postural dysfunction, Abnormal gait, Decreased strength, Difficulty walking  Visit Diagnosis: Muscle weakness (generalized)  Pain in left hip  Cramp and spasm  Abnormal posture     Problem List Patient Active Problem List   Diagnosis Date Noted  . Fibromyalgia 01/13/2016  . MGUS (monoclonal gammopathy of unknown significance) 12/23/2013  . Chronic cholecystitis without calculus 10/18/2012  . Abdominal pain, unspecified site 10/18/2012  . Nausea alone 10/18/2012  . Unspecified constipation 10/18/2012  . Depression 10/18/2012  . Bipolar 2 disorder ([place]) 10/18/2012  . Anxiety   . ASCUS (atypical squamous cells of undetermined significance) on Pap smear   . IUD   . Hemorrhoids 11/29/2010  . Abdominal pain, left upper quadrant 11/29/2010    Zannie Cove, PT 06/05/2016, 5:49 PM  [facility] [address] [place], [place], [address] Phone: [phone]   Fax:  [phone]  Name: REMI RESTER MRN: [id number] Date of Birth: 1961-02-24

## 2016-06-07 ENCOUNTER — Ambulatory Visit: Payer: Medicare Other | Admitting: Physical Therapy

## 2016-06-07 ENCOUNTER — Encounter: Payer: Self-pay | Admitting: Physical Therapy

## 2016-06-07 DIAGNOSIS — M6281 Muscle weakness (generalized): Secondary | ICD-10-CM

## 2016-06-07 DIAGNOSIS — G44209 Tension-type headache, unspecified, not intractable: Secondary | ICD-10-CM | POA: Diagnosis not present

## 2016-06-07 DIAGNOSIS — R252 Cramp and spasm: Secondary | ICD-10-CM

## 2016-06-07 DIAGNOSIS — R293 Abnormal posture: Secondary | ICD-10-CM

## 2016-06-07 DIAGNOSIS — M25552 Pain in left hip: Secondary | ICD-10-CM

## 2016-06-07 NOTE — Therapy (Signed)
[facility] [address], [place], [place], [address] Phone: [phone]   Fax:  [phone]  Physical Therapy Treatment  Patient Details  Name: Danielle Bruce MRN: [id number] Date of Birth: [birth year] Referring Provider: Mayra Neer, MD  Encounter Date: 06/07/2016      PT End of Session - 06/07/16 1735    Visit Number 19   Number of Visits 20   Date for PT Re-Evaluation 07/05/16   Authorization Type Gcodes at 20th visit; KX at 63   PT Start Time 1458   PT Stop Time 1533   PT Time Calculation (min) 35 min   Activity Tolerance Patient tolerated treatment well   Behavior During Therapy [facility] for tasks assessed/performed      Past Medical History:  Diagnosis Date  . Anemia   . Anxiety   . Arthritis    osteoarthritis  . ASCUS (atypical squamous cells of undetermined significance) on Pap smear 05/06/05   NEG HIGH RISK HPV--C&B BIOPSY BENIGN 12/2005  . Asthma   . Bipolar 2 disorder ([place])   . Cancer ([place])    skin cancer - basal cell  . Colon polyps   . Complication of anesthesia    anxious afterwards, will get headaches   . Constipation   . Depression   . Fibromyalgia 10/2013  . GERD (gastroesophageal reflux disease)   . 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 Heart murmur    never had any problems  . Hemorrhoids   . High cholesterol   . 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   cytology negative  . IBS (irritable bowel syndrome)   . Insomnia   . Lymphocytic colitis   . 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 Migraines   . Osteoarthritis   . Osteopenia   . Peripheral neuropathy ([place])   . PTSD (post-traumatic stress disorder)     Past Surgical History:  Procedure Laterality Date  . BONE MARROW BIOPSY Left 12/18/2013   Plasma cell dyscrasia 8% population of plasma cells  . BREAST BIOPSY Right    benign stereo  . CESAREAN SECTION   [phone]  . CHOLECYSTECTOMY N/A 10/16/2012   Procedure: LAPAROSCOPIC CHOLECYSTECTOMY;  Surgeon: Joyice Faster. Cornett, MD;  [facility];  Service: General;  Laterality: N/A;  . COLONOSCOPY     numerous times  . DILATION AND CURETTAGE OF UTERUS    . ESOPHAGOGASTRODUODENOSCOPY    . [place]   x3  . IUD REMOVAL  02/2015   Mirena  . LAPAROSCOPIC LYSIS OF ADHESIONS N/A 10/16/2012   Procedure: LAPAROSCOPIC LYSIS OF ADHESIONS;  Surgeon: Joyice Faster. Cornett, MD;  [facility];  Service: General;  Laterality: N/A;  . LAPAROSCOPY N/A 10/16/2012   Procedure: LAPAROSCOPY DIAGNOSTIC;  Surgeon: Joyice Faster. Cornett, MD;  [facility];  Service: General;  Laterality: N/A;  . PELVIC LAPAROSCOPY    . RADIOLOGY WITH ANESTHESIA N/A 12/16/2015   Procedure: MRI OF BRAIN WITH AND WITHOUT CONTRAST;  Surgeon: Medication Radiologist, MD;  Location: [facility];  Service: Radiology;  Laterality: N/A;  . SHOULDER SURGERY  2007/2008  . SPINE SURGERY  2010   cervical    There were no vitals filed for this visit.      Subjective Assessment - 06/07/16 1457    Subjective Pt arrived late today.  She is having a bad HA that is getting worse.  States  she has had  a HA since Monday.  Reports she hit her hip on the door and that has been a little inflamed as well.   Pertinent History fibromyalgia, cervical fusion, migrains.  Pt goes by "Danielle Bruce"   Limitations Reading;Walking   Currently in Pain? Yes   Pain Score 4    Pain Location Hip   Pain Orientation Left   Pain Descriptors / Indicators Aching   Multiple Pain Sites Yes   Pain Score 5   Pain Location Head                         [facility] Adult PT Treatment/Exercise - 06/07/16 0001      Neuro Re-ed    Neuro Re-ed Details  education on jaw positioning to avoid clenching     Manual Therapy   Manual Therapy Soft tissue mobilization;Myofascial release   Soft tissue mobilization masseter and pterygoids, temporalis, cervical  paraspinals and suboccipitals   Myofascial Release lymph drainage to face, MFR to suboccipitals and paraspinals in cervical spine                  PT Short Term Goals - 06/05/16 1748      PT SHORT TERM GOAL #2   Title increased hip strength to 4/5 bilateral hip abduction and adduction for improved gait   Baseline ...   Time 4   Period Weeks   Status On-going           PT Long Term Goals - 06/05/16 1748      PT LONG TERM GOAL #1   Title be independent in advanced HEP   Time 8   Period Weeks   Status On-going     PT LONG TERM GOAL #2   Title able to walk around the mall with her friend with 60% reduction in pain   Time 8   Period Weeks   Status On-going     PT LONG TERM GOAL #3   Title report 75% less HA throughout the day   Baseline reports no issues with daily headaches anymore   Time 8   Period Weeks   Status Achieved     PT LONG TERM GOAL #4   Title demonstrate 4+/5 bilateral hip strength without increased pain   Time 8   Period Weeks   Status On-going     PT LONG TERM GOAL #5   Title able to go up and down one flight of stairs without increased pain   Time 8   Period Weeks   Status On-going               Plan - 06/07/16 1736    Clinical Impression Statement Pt had fascial restrictions in cervical and cranial regions as well as muscles of mastication.  Muscle length and elasticity improved after treatment.  PT reviewed exercises verbally during manual treatment but did not have time to perform treatment to the hip due to needing to work on HA flare up at this time.  Pt will benefit from skilled PT to continue strengthening of core and LE   Rehab Potential Excellent   Clinical Impairments Affecting Rehab Potential fibromyalgia, cervical fusions, migrains   PT Treatment/Interventions ADLs/Self Care Home Management;Cryotherapy;Electrical Stimulation;Moist Heat;Traction;Functional mobility training;Therapeutic activities;Therapeutic  exercise;Balance training;Neuromuscular re-education;Patient/family education;Manual techniques;Passive range of motion;Dry needling;Taping;Ultrasound   PT Next Visit Plan ionto #4, LE strengthening as tolerated, cont standding and step ups, review correct jaw position for decreased clenching  PT Home Exercise Plan progress as needed   Consulted and Agree with Plan of Care Patient      Patient will benefit from skilled therapeutic intervention in order to improve the following deficits and impairments:  Increased muscle spasms, Increased fascial restricitons, Pain, Postural dysfunction, Abnormal gait, Decreased strength, Difficulty walking  Visit Diagnosis: Muscle weakness (generalized)  Pain in left hip  Cramp and spasm  Abnormal posture  Tension-type headache, not intractable, unspecified chronicity pattern     Problem List Patient Active Problem List   Diagnosis Date Noted  . Fibromyalgia 01/13/2016  . MGUS (monoclonal gammopathy of unknown significance) 12/23/2013  . Chronic cholecystitis without calculus 10/18/2012  . Abdominal pain, unspecified site 10/18/2012  . Nausea alone 10/18/2012  . Unspecified constipation 10/18/2012  . Depression 10/18/2012  . Bipolar 2 disorder ([place]) 10/18/2012  . Anxiety   . ASCUS (atypical squamous cells of undetermined significance) on Pap smear   . IUD   . Hemorrhoids 11/29/2010  . Abdominal pain, left upper quadrant 11/29/2010    Zannie Cove, PT 06/07/2016, 5:49 PM  [facility] [address] [place], [place], [address] Phone: [phone]   Fax:  [phone]  Name: RODNEISHA BONNET MRN: [id number] Date of Birth: 1960/08/14

## 2016-06-12 ENCOUNTER — Ambulatory Visit: Payer: Medicare Other | Admitting: Physical Therapy

## 2016-06-12 ENCOUNTER — Encounter: Payer: Self-pay | Admitting: Physical Therapy

## 2016-06-12 DIAGNOSIS — M25552 Pain in left hip: Secondary | ICD-10-CM | POA: Diagnosis not present

## 2016-06-12 DIAGNOSIS — G44209 Tension-type headache, unspecified, not intractable: Secondary | ICD-10-CM | POA: Diagnosis not present

## 2016-06-12 DIAGNOSIS — R252 Cramp and spasm: Secondary | ICD-10-CM | POA: Diagnosis not present

## 2016-06-12 DIAGNOSIS — M6281 Muscle weakness (generalized): Secondary | ICD-10-CM | POA: Diagnosis not present

## 2016-06-12 DIAGNOSIS — R293 Abnormal posture: Secondary | ICD-10-CM

## 2016-06-12 NOTE — Therapy (Signed)
[facility] [address], [place], [place], [address] Phone: [phone]   Fax:  [phone]  Physical Therapy Treatment  Patient Details  Name: Danielle Bruce MRN: [id number] Date of Birth: November 04, 1960 Referring Provider: Mayra Neer, MD  Encounter Date: 06/12/2016      PT End of Session - 06/12/16 1455    Visit Number 20   Number of Visits 30   Date for PT Re-Evaluation 07/05/16   Authorization Type Gcodes at 20th visit; KX at 57   PT Start Time 1450   PT Stop Time 1530   PT Time Calculation (min) 40 min   Activity Tolerance Patient tolerated treatment well   Behavior During Therapy [facility] for tasks assessed/performed      Past Medical History:  Diagnosis Date  . Anemia   . Anxiety   . Arthritis    osteoarthritis  . ASCUS (atypical squamous cells of undetermined significance) on Pap smear 05/06/05   NEG HIGH RISK HPV--C&B BIOPSY BENIGN 12/2005  . Asthma   . Bipolar 2 disorder ([place])   . Cancer ([place])    skin cancer - basal cell  . Colon polyps   . Complication of anesthesia    anxious afterwards, will get headaches   . Constipation   . Depression   . Fibromyalgia 10/2013  . GERD (gastroesophageal reflux disease)   . 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 Heart murmur    never had any problems  . Hemorrhoids   . High cholesterol   . 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   cytology negative  . IBS (irritable bowel syndrome)   . Insomnia   . Lymphocytic colitis   . 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 Migraines   . Osteoarthritis   . Osteopenia   . Peripheral neuropathy   . PTSD (post-traumatic stress disorder)     Past Surgical History:  Procedure Laterality Date  . BONE MARROW BIOPSY Left 12/18/2013   Plasma cell dyscrasia 8% population of plasma cells  . BREAST BIOPSY Right    benign stereo  . CESAREAN SECTION  [phone]   . CHOLECYSTECTOMY N/A 10/16/2012   Procedure: LAPAROSCOPIC CHOLECYSTECTOMY;  Surgeon: Joyice Faster. Cornett, MD;  [facility];  Service: General;  Laterality: N/A;  . COLONOSCOPY     numerous times  . DILATION AND CURETTAGE OF UTERUS    . ESOPHAGOGASTRODUODENOSCOPY    . [place]   x3  . IUD REMOVAL  02/2015   Mirena  . LAPAROSCOPIC LYSIS OF ADHESIONS N/A 10/16/2012   Procedure: LAPAROSCOPIC LYSIS OF ADHESIONS;  Surgeon: Joyice Faster. Cornett, MD;  [facility];  Service: General;  Laterality: N/A;  . LAPAROSCOPY N/A 10/16/2012   Procedure: LAPAROSCOPY DIAGNOSTIC;  Surgeon: Joyice Faster. Cornett, MD;  [facility];  Service: General;  Laterality: N/A;  . PELVIC LAPAROSCOPY    . RADIOLOGY WITH ANESTHESIA N/A 12/16/2015   Procedure: MRI OF BRAIN WITH AND WITHOUT CONTRAST;  Surgeon: Medication Radiologist, MD;  Location: [facility];  Service: Radiology;  Laterality: N/A;  . SHOULDER SURGERY  2007/2008  . SPINE SURGERY  2010   cervical    There were no vitals filed for this visit.      Subjective Assessment - 06/12/16 1453    Subjective Has not had any pain this weekend.  States her hip is feeling rested.  Patient has not  attempted stairs since starting PT   Pertinent History fibromyalgia, cervical fusion, migrains.  Pt goes by "Danielle Bruce"   Limitations Reading;Walking   How long can you walk comfortably? 20-30 minutes   Patient Stated Goals walking   Currently in Pain? No/denies                         [facility] Adult PT Treatment/Exercise - 06/12/16 0001      Knee/Hip Exercises: Stretches   Active Hamstring Stretch 3 reps;20 seconds   Piriformis Stretch Right;Left;20 seconds;3 reps  sitting   Gastroc Stretch Both;3 reps;30 seconds     Knee/Hip Exercises: Aerobic   Nustep L1 x 10 min (1000 strides)     Knee/Hip Exercises: Machines for Strengthening   Cybex Leg Press bilateral 90# 2x10; single 40# 2x10     Knee/Hip Exercises: Standing   Knee Flexion  Strengthening;Both;2 sets;10 reps  2#   Hip Abduction Stengthening;Both;2 sets;10 reps;Knee straight  2#   Hip Extension Stengthening;Right;Left;2 sets;10 reps;Knee straight  2#   Forward Step Up 20 reps;Hand Hold: 2;Left  2#     Knee/Hip Exercises: Seated   Other Seated Knee/Hip Exercises internal and external hip rotation left - red band 20x      Knee/Hip Exercises: Supine   Straight Leg Raises Strengthening;Left;10 reps  hip series 4 ways     Knee/Hip Exercises: Sidelying   Hip ABduction Strengthening;Left;10 reps   Hip ADduction Strengthening;Left;10 reps   Clams 30x     Knee/Hip Exercises: Prone   Hip Extension Strengthening;Left;10 reps     Iontophoresis   Type of Iontophoresis Dexamethasone  #4   Location left greater trochanteric bursa   Dose 1 mL   Time 6 hour release                  PT Short Term Goals - 06/05/16 1748      PT SHORT TERM GOAL #2   Title increased hip strength to 4/5 bilateral hip abduction and adduction for improved gait   Baseline ...   Time 4   Period Weeks   Status On-going           PT Long Term Goals - 06/12/16 1456      PT LONG TERM GOAL #1   Title be independent in advanced HEP   Time 8   Period Weeks   Status On-going     PT LONG TERM GOAL #2   Title able to walk around the mall with her friend with 60% reduction in pain   Baseline 50% reduced   Time 8   Period Weeks   Status On-going     PT LONG TERM GOAL #4   Title demonstrate 4+/5 bilateral hip strength without increased pain   Time 8   Period Weeks   Status On-going     PT LONG TERM GOAL #5   Title able to go up and down one flight of stairs without increased pain   Time 8   Period Weeks   Status On-going               Plan - 06/12/16 1736    Clinical Impression Statement Patient demonstrates improved strength with ability to perform single leg and hip exericses correctly.  She needs cues for correct LE alignment when performing stairs  and leg press today.  Pt has significantly more difficulty with leg press on left and is not bearing weight equally between both sides.  Pt conitnues to have difficutly walking up and down stairs.  Pt has improved 50% overall and progress was limited last week due to flare up of HA.  Pt continues to need skilled PT in order to address muscle spasms causing HA as they are expected to get worse if not addressed.  Pt is also needing skilled PT for LE strengthening of muscle imbalances leading to poor alignment during functional activities such as stairs and walking.  Pt is expected to conginue to make progress with skilled PT.   Rehab Potential Excellent   Clinical Impairments Affecting Rehab Potential fibromyalgia, cervical fusions, migrains   PT Treatment/Interventions ADLs/Self Care Home Management;Cryotherapy;Electrical Stimulation;Moist Heat;Traction;Functional mobility training;Therapeutic activities;Therapeutic exercise;Balance training;Neuromuscular re-education;Patient/family education;Manual techniques;Passive range of motion;Dry needling;Taping;Ultrasound   PT Next Visit Plan ionto #5, LE strengthening as tolerated, cont standding and step ups, review correct jaw position for decreased clenching    PT Home Exercise Plan progress as needed   Consulted and Agree with Plan of Care Patient      Patient will benefit from skilled therapeutic intervention in order to improve the following deficits and impairments:  Increased muscle spasms, Increased fascial restricitons, Pain, Postural dysfunction, Abnormal gait, Decreased strength, Difficulty walking  Visit Diagnosis: Muscle weakness (generalized)  Pain in left hip  Cramp and spasm  Abnormal posture       G-Codes - 07-01-16 1734    Functional Assessment Tool Used (Outpatient Only) clinical judgement   Functional Limitation Mobility: Walking and moving around   Mobility: Walking and Moving Around Current Status ([id number]) At least 40 percent  but less than 60 percent impaired, limited or restricted   Mobility: Walking and Moving Around Goal Status [phone]) At least 20 percent but less than 40 percent impaired, limited or restricted      Problem List Patient Active Problem List   Diagnosis Date Noted  . Fibromyalgia 01/13/2016  . MGUS (monoclonal gammopathy of unknown significance) 12/23/2013  . Chronic cholecystitis without calculus 10/18/2012  . Abdominal pain, unspecified site 10/18/2012  . Nausea alone 10/18/2012  . Unspecified constipation 10/18/2012  . Depression 10/18/2012  . Bipolar 2 disorder ([place]) 10/18/2012  . Anxiety   . ASCUS (atypical squamous cells of undetermined significance) on Pap smear   . IUD   . Hemorrhoids 11/29/2010  . Abdominal pain, left upper quadrant 11/29/2010    Zannie Cove, PT 07/01/16, 5:42 PM   [facility] [address], [place], [place], [address] Phone: [phone]   Fax:  [phone]  Name: ERIKKA FOLLMER MRN: [id number] Date of Birth: [birth year]

## 2016-06-14 ENCOUNTER — Encounter: Payer: Medicare Other | Admitting: Physical Therapy

## 2016-06-19 ENCOUNTER — Ambulatory Visit: Payer: Medicare Other | Admitting: Physical Therapy

## 2016-06-19 DIAGNOSIS — J301 Allergic rhinitis due to pollen: Secondary | ICD-10-CM | POA: Diagnosis not present

## 2016-06-19 DIAGNOSIS — M6281 Muscle weakness (generalized): Secondary | ICD-10-CM

## 2016-06-19 DIAGNOSIS — R252 Cramp and spasm: Secondary | ICD-10-CM

## 2016-06-19 DIAGNOSIS — M25552 Pain in left hip: Secondary | ICD-10-CM

## 2016-06-19 DIAGNOSIS — R293 Abnormal posture: Secondary | ICD-10-CM

## 2016-06-19 DIAGNOSIS — G44209 Tension-type headache, unspecified, not intractable: Secondary | ICD-10-CM | POA: Diagnosis not present

## 2016-06-19 DIAGNOSIS — J3081 Allergic rhinitis due to animal (cat) (dog) hair and dander: Secondary | ICD-10-CM | POA: Diagnosis not present

## 2016-06-19 DIAGNOSIS — J3089 Other allergic rhinitis: Secondary | ICD-10-CM | POA: Diagnosis not present

## 2016-06-19 NOTE — Therapy (Signed)
[facility] [address], [place], [place], [address] Phone: [phone]   Fax:  [phone]  Physical Therapy Treatment  Patient Details  Name: Danielle Bruce MRN: [id number] Date of Birth: 09-07-1960 Referring Provider: Mayra Neer, MD  Encounter Date: 06/19/2016      PT End of Session - 06/19/16 1651    Visit Number 21   Number of Visits 30   Date for PT Re-Evaluation 07/05/16   Authorization Type Gcodes at 20th visit; KX at 34   PT Start Time 1451   PT Stop Time 1535   PT Time Calculation (min) 44 min   Activity Tolerance Patient tolerated treatment well   Behavior During Therapy [facility] for tasks assessed/performed      Past Medical History:  Diagnosis Date  . Anemia   . Anxiety   . Arthritis    osteoarthritis  . ASCUS (atypical squamous cells of undetermined significance) on Pap smear 05/06/05   NEG HIGH RISK HPV--C&B BIOPSY BENIGN 12/2005  . Asthma   . Bipolar 2 disorder ([place])   . Cancer ([place])    skin cancer - basal cell  . Colon polyps   . Complication of anesthesia    anxious afterwards, will get headaches   . Constipation   . Depression   . Fibromyalgia 10/2013  . GERD (gastroesophageal reflux disease)   . 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 Heart murmur    never had any problems  . Hemorrhoids   . High cholesterol   . 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   cytology negative  . IBS (irritable bowel syndrome)   . Insomnia   . Lymphocytic colitis   . 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 Migraines   . Osteoarthritis   . Osteopenia   . Peripheral neuropathy   . PTSD (post-traumatic stress disorder)     Past Surgical History:  Procedure Laterality Date  . BONE MARROW BIOPSY Left 12/18/2013   Plasma cell dyscrasia 8% population of plasma cells  . BREAST BIOPSY Right    benign stereo  . CESAREAN SECTION  [phone]   . CHOLECYSTECTOMY N/A 10/16/2012   Procedure: LAPAROSCOPIC CHOLECYSTECTOMY;  Surgeon: Joyice Faster. Cornett, MD;  Location: [facility];  Service: General;  Laterality: N/A;  . COLONOSCOPY     numerous times  . DILATION AND CURETTAGE OF UTERUS    . ESOPHAGOGASTRODUODENOSCOPY    . [place]   x3  . IUD REMOVAL  02/2015   [place]  . LAPAROSCOPIC LYSIS OF ADHESIONS N/A 10/16/2012   Procedure: LAPAROSCOPIC LYSIS OF ADHESIONS;  Surgeon: Joyice Faster. Cornett, MD;  [facility];  Service: General;  Laterality: N/A;  . LAPAROSCOPY N/A 10/16/2012   Procedure: LAPAROSCOPY DIAGNOSTIC;  Surgeon: Joyice Faster. Cornett, MD;  [facility];  Service: General;  Laterality: N/A;  . PELVIC LAPAROSCOPY    . RADIOLOGY WITH ANESTHESIA N/A 12/16/2015   Procedure: MRI OF BRAIN WITH AND WITHOUT CONTRAST;  Surgeon: Medication Radiologist, MD;  Location: [place];  Service: Radiology;  Laterality: N/A;  . SHOULDER SURGERY  2007/2008  . SPINE SURGERY  2010   cervical    There were no vitals filed for this visit.      Subjective Assessment - 06/19/16 1454    Subjective I'm having fog and forgetfulness and bad HA since last Tuesday.  I feel it along my jaw  today.  I have a schedule appointment tomorrow.   Pertinent History fibromyalgia, cervical fusion, migrains.  Pt goes by "[name]"   Limitations Reading;Walking   How long can you walk comfortably? 20-30 minutes   Patient Stated Goals walking   Currently in Pain? Yes   Pain Score 4    Pain Location Head   Pain Orientation Left   Pain Descriptors / Indicators Aching   Pain Type Chronic pain   Pain Radiating Towards head and down to jaw   Pain Frequency Intermittent   Aggravating Factors  noise and light, dark, smells   Pain Relieving Factors rest    Effect of Pain on Daily Activities any household activities   Multiple Pain Sites No                         [facility] Adult PT Treatment/Exercise - 06/19/16 0001      Manual Therapy    Manual Therapy Soft tissue mobilization;Myofascial release   Soft tissue mobilization masseter and temporalis externally   Myofascial Release cranial aponeurosis and posterior cervical muscles                  PT Short Term Goals - 06/19/16 1653      PT SHORT TERM GOAL #2   Title increased hip strength to 4/5 bilateral hip abduction and adduction for improved gait   Time 4   Period Weeks   Status On-going           PT Long Term Goals - 06/19/16 1652      PT LONG TERM GOAL #1   Title be independent in advanced HEP   Time 8   Period Weeks   Status On-going               Plan - 06/19/16 1653    Clinical Impression Statement Patient was unable to progress hip exercises today due to increased pain and issue with recurring HA.  Pt responded well to manual therapy and had reduced muscle tightness in jaw and fascial restrictions of cranial aponeurosis after soft tissue and myofascial release.  Pt is expected to have recurrence of HA without skilled PT to perform manual techniques at this time, so session was focused on alieviating those symptoms so that patient will be able to work on HEP for hip strengthening.  Skilled PT is needed to continue to progress hip strength for improved function.   Rehab Potential Excellent   Clinical Impairments Affecting Rehab Potential fibromyalgia, cervical fusions, migrains   PT Treatment/Interventions ADLs/Self Care Home Management;Cryotherapy;Electrical Stimulation;Moist Heat;Traction;Functional mobility training;Therapeutic activities;Therapeutic exercise;Balance training;Neuromuscular re-education;Patient/family education;Manual techniques;Passive range of motion;Dry needling;Taping;Ultrasound   PT Next Visit Plan ionto #5, LE strengthening as tolerated, cont standding and step ups, review correct jaw position for decreased clenching    Consulted and Agree with Plan of Care Patient      Patient will benefit from skilled therapeutic  intervention in order to improve the following deficits and impairments:  Increased muscle spasms, Increased fascial restricitons, Pain, Postural dysfunction, Abnormal gait, Decreased strength, Difficulty walking  Visit Diagnosis: Muscle weakness (generalized)  Pain in left hip  Cramp and spasm  Abnormal posture  Tension-type headache, not intractable, unspecified chronicity pattern     Problem List Patient Active Problem List   Diagnosis Date Noted  . Fibromyalgia 01/13/2016  . MGUS (monoclonal gammopathy of unknown significance) 12/23/2013  . Chronic cholecystitis without calculus 10/18/2012  . Abdominal pain, unspecified site 10/18/2012  .  Nausea alone 10/18/2012  . Unspecified constipation 10/18/2012  . Depression 10/18/2012  . Bipolar 2 disorder ([place]) 10/18/2012  . Anxiety   . ASCUS (atypical squamous cells of undetermined significance) on Pap smear   . IUD   . Hemorrhoids 11/29/2010  . Abdominal pain, left upper quadrant 11/29/2010    Zannie Cove, PT 06/19/2016, 5:01 PM  [facility] [address], [place], [place], [address] Phone: [phone]   Fax:  [phone]  Name: Danielle Bruce MRN: [id number] Date of Birth: 1961-01-04

## 2016-06-20 DIAGNOSIS — M797 Fibromyalgia: Secondary | ICD-10-CM | POA: Diagnosis not present

## 2016-06-20 DIAGNOSIS — G43909 Migraine, unspecified, not intractable, without status migrainosus: Secondary | ICD-10-CM | POA: Diagnosis not present

## 2016-06-20 DIAGNOSIS — E7800 Pure hypercholesterolemia, unspecified: Secondary | ICD-10-CM | POA: Diagnosis not present

## 2016-06-20 DIAGNOSIS — F3181 Bipolar II disorder: Secondary | ICD-10-CM | POA: Diagnosis not present

## 2016-06-20 DIAGNOSIS — F900 Attention-deficit hyperactivity disorder, predominantly inattentive type: Secondary | ICD-10-CM | POA: Diagnosis not present

## 2016-06-20 DIAGNOSIS — K5289 Other specified noninfective gastroenteritis and colitis: Secondary | ICD-10-CM | POA: Diagnosis not present

## 2016-06-20 DIAGNOSIS — D472 Monoclonal gammopathy: Secondary | ICD-10-CM | POA: Diagnosis not present

## 2016-06-20 DIAGNOSIS — K589 Irritable bowel syndrome without diarrhea: Secondary | ICD-10-CM | POA: Diagnosis not present

## 2016-06-20 DIAGNOSIS — F411 Generalized anxiety disorder: Secondary | ICD-10-CM | POA: Diagnosis not present

## 2016-06-20 DIAGNOSIS — L989 Disorder of the skin and subcutaneous tissue, unspecified: Secondary | ICD-10-CM | POA: Diagnosis not present

## 2016-06-21 ENCOUNTER — Encounter: Payer: Self-pay | Admitting: Physical Therapy

## 2016-06-21 ENCOUNTER — Ambulatory Visit: Payer: Medicare Other | Admitting: Physical Therapy

## 2016-06-21 DIAGNOSIS — M6281 Muscle weakness (generalized): Secondary | ICD-10-CM

## 2016-06-21 DIAGNOSIS — M25552 Pain in left hip: Secondary | ICD-10-CM

## 2016-06-21 DIAGNOSIS — R252 Cramp and spasm: Secondary | ICD-10-CM

## 2016-06-21 DIAGNOSIS — R293 Abnormal posture: Secondary | ICD-10-CM

## 2016-06-21 DIAGNOSIS — G44209 Tension-type headache, unspecified, not intractable: Secondary | ICD-10-CM | POA: Diagnosis not present

## 2016-06-21 NOTE — Therapy (Signed)
[facility] [address], [place], [place], [address] Phone: [phone]   Fax:  [phone]  Physical Therapy Treatment  Patient Details  Name: Danielle Bruce MRN: [id number] Date of Birth: 1960-03-05 Referring Provider: Mayra Neer, MD  Encounter Date: 06/21/2016      PT End of Session - 06/21/16 1715    Visit Number 22   Number of Visits 30   Date for PT Re-Evaluation 07/05/16   Authorization Type Gcodes at 30th visit; KX at 35   PT Start Time 1454   PT Stop Time 1530   PT Time Calculation (min) 36 min   Activity Tolerance Patient tolerated treatment well   Behavior During Therapy [facility] for tasks assessed/performed      Past Medical History:  Diagnosis Date  . Anemia   . Anxiety   . Arthritis    osteoarthritis  . ASCUS (atypical squamous cells of undetermined significance) on Pap smear 05/06/05   NEG HIGH RISK HPV--C&B BIOPSY BENIGN 12/2005  . Asthma   . Bipolar 2 disorder ([place])   . Cancer ([place])    skin cancer - basal cell  . Colon polyps   . Complication of anesthesia    anxious afterwards, will get headaches   . Constipation   . Depression   . Fibromyalgia 10/2013  . GERD (gastroesophageal reflux disease)   . 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 Heart murmur    never had any problems  . Hemorrhoids   . High cholesterol   . 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   cytology negative  . IBS (irritable bowel syndrome)   . Insomnia   . Lymphocytic colitis   . 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 Migraines   . Osteoarthritis   . Osteopenia   . Peripheral neuropathy   . PTSD (post-traumatic stress disorder)     Past Surgical History:  Procedure Laterality Date  . BONE MARROW BIOPSY Left 12/18/2013   Plasma cell dyscrasia 8% population of plasma cells  . BREAST BIOPSY Right    benign stereo  . CESAREAN SECTION  [phone]   . CHOLECYSTECTOMY N/A 10/16/2012   Procedure: LAPAROSCOPIC CHOLECYSTECTOMY;  Surgeon: Joyice Faster. Cornett, MD;  [facility];  Service: General;  Laterality: N/A;  . COLONOSCOPY     numerous times  . DILATION AND CURETTAGE OF UTERUS    . ESOPHAGOGASTRODUODENOSCOPY    . Danielle Bruce   x3  . IUD REMOVAL  02/2015   [place]rena  . LAPAROSCOPIC LYSIS OF ADHESIONS N/A 10/16/2012   Procedure: LAPAROSCOPIC LYSIS OF ADHESIONS;  Surgeon: Joyice Faster. Cornett, MD;  [facility];  Service: General;  Laterality: N/A;  . LAPAROSCOPY N/A 10/16/2012   Procedure: LAPAROSCOPY DIAGNOSTIC;  Surgeon: Joyice Faster. Cornett, MD;  [facility];  Service: General;  Laterality: N/A;  . PELVIC LAPAROSCOPY    . RADIOLOGY WITH ANESTHESIA N/A 12/16/2015   Procedure: MRI OF BRAIN WITH AND WITHOUT CONTRAST;  Surgeon: Medication Radiologist, MD;  Location: [place];  Service: Radiology;  Laterality: N/A;  . SHOULDER SURGERY  2007/2008  . SPINE SURGERY  2010   cervical    There were no vitals filed for this visit.      Subjective Assessment - 06/21/16 1459    Subjective My hip is sore from injections yesterday.  Injections were for my headaches.  I have been trying  to keep heat on it.   Pertinent History fibromyalgia, cervical fusion, migrains.  Pt goes by "Danielle Bruce"   Limitations Reading;Walking   How long can you walk comfortably? 20-30 minutes   Patient Stated Goals walking   Currently in Pain? Yes   Pain Score 4    Pain Location Hip   Pain Orientation Left   Pain Descriptors / Indicators Aching;Sore   Pain Type Chronic pain   Pain Radiating Towards into the back   Pain Onset More than a month ago   Pain Frequency Constant   Aggravating Factors  just hurts all the time   Pain Relieving Factors at this time, nothing   Effect of Pain on Daily Activities i have had a headache so I haven't been able to do anything   Multiple Pain Sites No                         [facility] Adult PT  Treatment/Exercise - 06/21/16 0001      Knee/Hip Exercises: Stretches   Active Hamstring Stretch 3 reps;20 seconds   Gastroc Stretch Both;3 reps;30 seconds     Knee/Hip Exercises: Aerobic   Nustep L1 x 10 min (1000 strides)     Knee/Hip Exercises: Machines for Strengthening   Cybex Leg Press single leg with yellow band pull 25# 4x10     Knee/Hip Exercises: Standing   Other Standing Knee Exercises 2 inch step down - 20x     Knee/Hip Exercises: Seated   Other Seated Knee/Hip Exercises fascial release with rolling on spiky roller     Knee/Hip Exercises: Supine   Straight Leg Raises Strengthening;Left;10 reps  hip series 4 ways     Knee/Hip Exercises: Sidelying   Clams 20     Iontophoresis   Type of Iontophoresis Dexamethasone  #5   Location left greater trochanteric bursa   Dose 1 mL   Time 6 hour release                  PT Short Term Goals - 06/19/16 1653      PT SHORT TERM GOAL #2   Title increased hip strength to 4/5 bilateral hip abduction and adduction for improved gait   Time 4   Period Weeks   Status On-going           PT Long Term Goals - 06/21/16 1716      PT LONG TERM GOAL #1   Title be independent in advanced HEP   Time 8   Period Weeks   Status On-going     PT LONG TERM GOAL #2   Title able to walk around the mall with her friend with 60% reduction in pain   Time 8   Period Weeks   Status On-going     PT LONG TERM GOAL #5   Title able to go up and down one flight of stairs without increased pain   Time 8   Period Weeks   Status On-going               Plan - 06/21/16 1717    Clinical Impression Statement Patient able to demonstrate correct form for mat exercises that she has as part of HEP. She was educated on fascial release and stretches to perform at home.  Patient continues to have difficulty with knee and hip alignment due to hip weakness and body awaremess.  Skilled PT needed for improved function for community  ambulation and  return to walking for exercise.   Rehab Potential Excellent   PT Treatment/Interventions ADLs/Self Care Home Management;Cryotherapy;Electrical Stimulation;Moist Heat;Traction;Functional mobility training;Therapeutic activities;Therapeutic exercise;Balance training;Neuromuscular re-education;Patient/family education;Manual techniques;Passive range of motion;Dry needling;Taping;Ultrasound   PT Next Visit Plan ionto #6, LE strengthening as tolerated, cont standding and step ups, review correct jaw position for decreased clenching    Consulted and Agree with Plan of Care Patient      Patient will benefit from skilled therapeutic intervention in order to improve the following deficits and impairments:  Increased muscle spasms, Increased fascial restricitons, Pain, Postural dysfunction, Abnormal gait, Decreased strength, Difficulty walking  Visit Diagnosis: Muscle weakness (generalized)  Pain in left hip  Cramp and spasm  Abnormal posture     Problem List Patient Active Problem List   Diagnosis Date Noted  . Fibromyalgia 01/13/2016  . MGUS (monoclonal gammopathy of unknown significance) 12/23/2013  . Chronic cholecystitis without calculus 10/18/2012  . Abdominal pain, unspecified site 10/18/2012  . Nausea alone 10/18/2012  . Unspecified constipation 10/18/2012  . Depression 10/18/2012  . Bipolar 2 disorder ([place]) 10/18/2012  . Anxiety   . ASCUS (atypical squamous cells of undetermined significance) on Pap smear   . IUD   . Hemorrhoids 11/29/2010  . Abdominal pain, left upper quadrant 11/29/2010    Zannie Cove, PT 06/21/2016, 5:21 PM  [facility] [address] [place], [place], [address] Phone: [phone]   Fax:  [phone]  Name: LEYA PAIGE MRN: [id number] Date of Birth: 1960-05-23

## 2016-06-26 ENCOUNTER — Ambulatory Visit: Payer: Medicare Other | Admitting: Physical Therapy

## 2016-06-26 DIAGNOSIS — M25552 Pain in left hip: Secondary | ICD-10-CM | POA: Diagnosis not present

## 2016-06-26 DIAGNOSIS — R252 Cramp and spasm: Secondary | ICD-10-CM

## 2016-06-26 DIAGNOSIS — R293 Abnormal posture: Secondary | ICD-10-CM

## 2016-06-26 DIAGNOSIS — M6281 Muscle weakness (generalized): Secondary | ICD-10-CM

## 2016-06-26 DIAGNOSIS — G44209 Tension-type headache, unspecified, not intractable: Secondary | ICD-10-CM | POA: Diagnosis not present

## 2016-06-26 NOTE — Therapy (Signed)
[facility] [address], [place], [place], [address] Phone: [phone]   Fax:  [phone]  Physical Therapy Treatment  Patient Details  Name: Danielle Bruce MRN: [id number] Date of Birth: Mar 07, 1960 Referring Provider: Mayra Neer, MD  Encounter Date: 06/26/2016      PT End of Session - 06/26/16 1409    Visit Number 23   Number of Visits 30   Date for PT Re-Evaluation 07/05/16   Authorization Type Gcodes at 30th visit; KX at 79   PT Start Time 1400   PT Stop Time 1443   PT Time Calculation (min) 43 min   Activity Tolerance Patient tolerated treatment well   Behavior During Therapy [facility] for tasks assessed/performed      Past Medical History:  Diagnosis Date  . Anemia   . Anxiety   . Arthritis    osteoarthritis  . ASCUS (atypical squamous cells of undetermined significance) on Pap smear 05/06/05   NEG HIGH RISK HPV--C&B BIOPSY BENIGN 12/2005  . Asthma   . Bipolar 2 disorder ([place])   . Cancer ([place])    skin cancer - basal cell  . Colon polyps   . Complication of anesthesia    anxious afterwards, will get headaches   . Constipation   . Depression   . Fibromyalgia 10/2013  . GERD (gastroesophageal reflux disease)   . 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 Heart murmur    never had any problems  . Hemorrhoids   . High cholesterol   . 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   cytology negative  . IBS (irritable bowel syndrome)   . Insomnia   . Lymphocytic colitis   . 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 Migraines   . Osteoarthritis   . Osteopenia   . Peripheral neuropathy   . PTSD (post-traumatic stress disorder)     Past Surgical History:  Procedure Laterality Date  . BONE MARROW BIOPSY Left 12/18/2013   Plasma cell dyscrasia 8% population of plasma cells  . BREAST BIOPSY Right    benign stereo  . CESAREAN SECTION  [phone]   . CHOLECYSTECTOMY N/A 10/16/2012   Procedure: LAPAROSCOPIC CHOLECYSTECTOMY;  Surgeon: Joyice Faster. Cornett, MD;  [facility];  Service: General;  Laterality: N/A;  . COLONOSCOPY     numerous times  . DILATION AND CURETTAGE OF UTERUS    . ESOPHAGOGASTRODUODENOSCOPY    . [place]   x3  . IUD REMOVAL  02/2015   Mirena  . LAPAROSCOPIC LYSIS OF ADHESIONS N/A 10/16/2012   Procedure: LAPAROSCOPIC LYSIS OF ADHESIONS;  Surgeon: Joyice Faster. Cornett, MD;  [facility];  Service: General;  Laterality: N/A;  . LAPAROSCOPY N/A 10/16/2012   Procedure: LAPAROSCOPY DIAGNOSTIC;  Surgeon: Joyice Faster. Cornett, MD;  [facility];  Service: General;  Laterality: N/A;  . PELVIC LAPAROSCOPY    . RADIOLOGY WITH ANESTHESIA N/A 12/16/2015   Procedure: MRI OF BRAIN WITH AND WITHOUT CONTRAST;  Surgeon: Medication Radiologist, MD;  Location: [place];  Service: Radiology;  Laterality: N/A;  . SHOULDER SURGERY  2007/2008  . SPINE SURGERY  2010   cervical    There were no vitals filed for this visit.      Subjective Assessment - 06/26/16 1405    Subjective My son is here and talking to me about joining a gym.  My leg has been feeling  good.   Pertinent History fibromyalgia, cervical fusion, migrains.  Pt goes by "[name]"   Limitations Reading;Walking   Currently in Pain? No/denies                         [facility] Adult PT Treatment/Exercise - 06/26/16 0001      Therapeutic Activites    Therapeutic Activities ADL's   ADL's stepping up 6 inch with improved knee alignment, down on 2" step with band pull for knee alignment     Knee/Hip Exercises: Stretches   Active Hamstring Stretch 3 reps;20 seconds   Gastroc Stretch Both;3 reps;30 seconds     Knee/Hip Exercises: Aerobic   Elliptical 5 min   Nustep L1 x 6.5 min (1000 strides)     Knee/Hip Exercises: Machines for Strengthening   Cybex Leg Press bilateral 50# 20x, 90# 20x, single leg with yellow band pull 25# 4x10      Knee/Hip Exercises: Sidelying   Hip ABduction Strengthening;Left;10 reps   Hip ADduction Strengthening;Left;10 reps   Clams 30x     Knee/Hip Exercises: Prone   Hip Extension Strengthening;Left;10 reps     Iontophoresis   Type of Iontophoresis Dexamethasone  #6   Location left greater trochanteric bursa   Dose 1 mL   Time 6 hour release                  PT Short Term Goals - 06/19/16 1653      PT SHORT TERM GOAL #2   Title increased hip strength to 4/5 bilateral hip abduction and adduction for improved gait   Time 4   Period Weeks   Status On-going           PT Long Term Goals - 06/26/16 1423      PT LONG TERM GOAL #5   Title able to go up and down one flight of stairs without increased pain   Time 8   Period Weeks   Status On-going               Plan - 06/26/16 1418    Clinical Impression Statement Patient demonstrates decreased pain with exercises and improved strength and stability.  Able to add foam mat to standing exercises.  Pt continues to need cues for exercises and will benefit from skilled PT   Rehab Potential Excellent   Clinical Impairments Affecting Rehab Potential fibromyalgia, cervical fusions, migrains   PT Treatment/Interventions ADLs/Self Care Home Management;Cryotherapy;Electrical Stimulation;Moist Heat;Traction;Functional mobility training;Therapeutic activities;Therapeutic exercise;Balance training;Neuromuscular re-education;Patient/family education;Manual techniques;Passive range of motion;Dry needling;Taping;Ultrasound   PT Next Visit Plan LE strengthening as tolerated, cont standing and step up/step down   Consulted and Agree with Plan of Care Patient      Patient will benefit from skilled therapeutic intervention in order to improve the following deficits and impairments:  Increased muscle spasms, Increased fascial restricitons, Pain, Postural dysfunction, Abnormal gait, Decreased strength, Difficulty walking  Visit  Diagnosis: Muscle weakness (generalized)  Pain in left hip  Cramp and spasm  Abnormal posture  Tension-type headache, not intractable, unspecified chronicity pattern     Problem List Patient Active Problem List   Diagnosis Date Noted  . Fibromyalgia 01/13/2016  . MGUS (monoclonal gammopathy of unknown significance) 12/23/2013  . Chronic cholecystitis without calculus 10/18/2012  . Abdominal pain, unspecified site 10/18/2012  . Nausea alone 10/18/2012  . Unspecified constipation 10/18/2012  . Depression 10/18/2012  . Bipolar 2 disorder ([place]) 10/18/2012  . Anxiety   . ASCUS (  atypical squamous cells of undetermined significance) on Pap smear   . IUD   . Hemorrhoids 11/29/2010  . Abdominal pain, left upper quadrant 11/29/2010    Zannie Cove, PT 06/26/2016, 2:57 PM  [facility] [address] [place], [place], [address] Phone: [phone]   Fax:  [phone]  Name: Danielle Bruce MRN: [id number] Date of Birth: 1960-04-23

## 2016-06-29 ENCOUNTER — Ambulatory Visit: Payer: Medicare Other | Attending: Family Medicine | Admitting: Physical Therapy

## 2016-06-29 DIAGNOSIS — G44209 Tension-type headache, unspecified, not intractable: Secondary | ICD-10-CM

## 2016-06-29 DIAGNOSIS — R252 Cramp and spasm: Secondary | ICD-10-CM

## 2016-06-29 DIAGNOSIS — J3089 Other allergic rhinitis: Secondary | ICD-10-CM | POA: Diagnosis not present

## 2016-06-29 DIAGNOSIS — M25552 Pain in left hip: Secondary | ICD-10-CM | POA: Insufficient documentation

## 2016-06-29 DIAGNOSIS — M6281 Muscle weakness (generalized): Secondary | ICD-10-CM | POA: Diagnosis not present

## 2016-06-29 DIAGNOSIS — J3081 Allergic rhinitis due to animal (cat) (dog) hair and dander: Secondary | ICD-10-CM | POA: Diagnosis not present

## 2016-06-29 DIAGNOSIS — R293 Abnormal posture: Secondary | ICD-10-CM

## 2016-06-29 DIAGNOSIS — J301 Allergic rhinitis due to pollen: Secondary | ICD-10-CM | POA: Diagnosis not present

## 2016-06-29 NOTE — Therapy (Signed)
[facility] [address] [place], [place], [address] Phone: [phone]   Fax:  [phone]  Physical Therapy Treatment  Patient Details  Name: Danielle Bruce MRN: [id number] Date of Birth: [birth year] Referring Provider: Mayra Neer, MD  Encounter Date: 06/29/2016      PT End of Session - 06/29/16 1402    Visit Number 24   Number of Visits 30   Date for PT Re-Evaluation 07/05/16   Authorization Type Gcodes at 30th visit; KX at 25   PT Start Time 1236   PT Stop Time 1313   PT Time Calculation (min) 37 min   Activity Tolerance Patient tolerated treatment well   Behavior During Therapy [facility] for tasks assessed/performed      Past Medical History:  Diagnosis Date  . Anemia   . Anxiety   . Arthritis    osteoarthritis  . ASCUS (atypical squamous cells of undetermined significance) on Pap smear 05/06/05   NEG HIGH RISK HPV--C&B BIOPSY BENIGN 12/2005  . Asthma   . Bipolar 2 disorder ([place])   . Cancer ([place])    skin cancer - basal cell  . Colon polyps   . Complication of anesthesia    anxious afterwards, will get headaches   . Constipation   . Depression   . Fibromyalgia 10/2013  . GERD (gastroesophageal reflux disease)   . 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 Heart murmur    never had any problems  . Hemorrhoids   . High cholesterol   . 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   cytology negative  . IBS (irritable bowel syndrome)   . Insomnia   . Lymphocytic colitis   . 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 Migraines   . Osteoarthritis   . Osteopenia   . Peripheral neuropathy   . PTSD (post-traumatic stress disorder)     Past Surgical History:  Procedure Laterality Date  . BONE MARROW BIOPSY Left 12/18/2013   Plasma cell dyscrasia 8% population of plasma cells  . BREAST BIOPSY Right    benign stereo  . CESAREAN SECTION  [phone]   . CHOLECYSTECTOMY N/A 10/16/2012   Procedure: LAPAROSCOPIC CHOLECYSTECTOMY;  Surgeon: Joyice Faster. Cornett, MD;  Location: [facility];  Service: General;  Laterality: N/A;  . COLONOSCOPY     numerous times  . DILATION AND CURETTAGE OF UTERUS    . ESOPHAGOGASTRODUODENOSCOPY    . [place]   x3  . IUD REMOVAL  02/2015   Mirena  . LAPAROSCOPIC LYSIS OF ADHESIONS N/A 10/16/2012   Procedure: LAPAROSCOPIC LYSIS OF ADHESIONS;  Surgeon: Joyice Faster. Cornett, MD;  [facility];  Service: General;  Laterality: N/A;  . LAPAROSCOPY N/A 10/16/2012   Procedure: LAPAROSCOPY DIAGNOSTIC;  Surgeon: Joyice Faster. Cornett, MD;  [facility];  Service: General;  Laterality: N/A;  . PELVIC LAPAROSCOPY    . RADIOLOGY WITH ANESTHESIA N/A 12/16/2015   Procedure: MRI OF BRAIN WITH AND WITHOUT CONTRAST;  Surgeon: Medication Radiologist, MD;  Location: [facility];  Service: Radiology;  Laterality: N/A;  . SHOULDER SURGERY  2007/2008  . SPINE SURGERY  2010   cervical    There were no vitals filed for this visit.      Subjective Assessment - 06/29/16 1357    Subjective I haven't had a headache this bad since getting those injections over a week ago.  I woke  up with a HA today and those are always the worst.  My hip was a little sore yesterday when I did a lot of walking.   Pertinent History fibromyalgia, cervical fusion, migrains.  Pt goes by "Danielle Bruce"   Limitations Reading;Walking   Currently in Pain? Yes   Pain Score 9    Pain Location Head   Pain Descriptors / Indicators Nagging;Aching;Sharp;Radiating   Pain Type Chronic pain   Pain Radiating Towards behind eyes and jaw feels tight   Pain Onset More than a month ago   Pain Frequency Intermittent   Aggravating Factors  i don't know   Pain Relieving Factors no light and rest   Effect of Pain on Daily Activities I can't do anything but lie down   Multiple Pain Sites No                         [facility] Adult PT Treatment/Exercise -  06/29/16 0001      Manual Therapy   Manual Therapy Soft tissue mobilization;Myofascial release   Soft tissue mobilization masseter and temporalis intraorally; suboccipitals, cervical paraspinals   Myofascial Release cranial aponeurosis and posterior cervical muscles                  PT Short Term Goals - 06/19/16 1653      PT SHORT TERM GOAL #2   Title increased hip strength to 4/5 bilateral hip abduction and adduction for improved gait   Time 4   Period Weeks   Status On-going           PT Long Term Goals - 06/29/16 1423      PT LONG TERM GOAL #1   Title be independent in advanced HEP   Time 8   Period Weeks   Status On-going     PT LONG TERM GOAL #2   Title able to walk around the mall with her friend with 60% reduction in pain   Time 8   Period Weeks   Status On-going               Plan - 06/29/16 1403    Clinical Impression Statement Patient unable to perform exercises today due to severe HA.  Pt has reduced symptoms after manual techniques.  She was educated on wearing mouth guard due to increased muscle spasms in jaw spasms.  Pt needed more focus on manual due to chronic condition which is expected to worsen without skilled intervention and interrupt progress of strengthening program.  Pt was informed of [employer] PREP and will follow up with signing upin order to transition to HEP.    Rehab Potential Excellent   Clinical Impairments Affecting Rehab Potential fibromyalgia, cervical fusions, migrains   PT Treatment/Interventions ADLs/Self Care Home Management;Cryotherapy;Electrical Stimulation;Moist Heat;Traction;Functional mobility training;Therapeutic activities;Therapeutic exercise;Balance training;Neuromuscular re-education;Patient/family education;Manual techniques;Passive range of motion;Dry needling;Taping;Ultrasound   PT Next Visit Plan LE strengthening as tolerated, cont standing and step up/step down   PT Home Exercise Plan progress as needed    Consulted and Agree with Plan of Care Patient      Patient will benefit from skilled therapeutic intervention in order to improve the following deficits and impairments:  Increased muscle spasms, Increased fascial restricitons, Pain, Postural dysfunction, Abnormal gait, Decreased strength, Difficulty walking  Visit Diagnosis: Muscle weakness (generalized)  Pain in left hip  Cramp and spasm  Abnormal posture  Tension-type headache, not intractable, unspecified chronicity pattern     Problem List Patient Active  Problem List   Diagnosis Date Noted  . Fibromyalgia 01/13/2016  . MGUS (monoclonal gammopathy of unknown significance) 12/23/2013  . Chronic cholecystitis without calculus 10/18/2012  . Abdominal pain, unspecified site 10/18/2012  . Nausea alone 10/18/2012  . Unspecified constipation 10/18/2012  . Depression 10/18/2012  . Bipolar 2 disorder ([place]) 10/18/2012  . Anxiety   . ASCUS (atypical squamous cells of undetermined significance) on Pap smear   . IUD   . Hemorrhoids 11/29/2010  . Abdominal pain, left upper quadrant 11/29/2010    Zannie Cove, PT 06/29/2016, 2:26 PM  [facility] [address] [place], [place], [address] Phone: [phone]   Fax:  [phone]  Name: Danielle Bruce MRN: [id number] Date of Birth: 05-01-1960

## 2016-07-01 DIAGNOSIS — J0140 Acute pansinusitis, unspecified: Secondary | ICD-10-CM | POA: Diagnosis not present

## 2016-07-01 DIAGNOSIS — J069 Acute upper respiratory infection, unspecified: Secondary | ICD-10-CM | POA: Diagnosis not present

## 2016-07-03 ENCOUNTER — Encounter: Payer: Medicare Other | Admitting: Physical Therapy

## 2016-07-03 ENCOUNTER — Encounter: Payer: Self-pay | Admitting: Internal Medicine

## 2016-07-05 ENCOUNTER — Ambulatory Visit: Payer: Medicare Other | Admitting: Physical Therapy

## 2016-07-05 ENCOUNTER — Telehealth: Payer: Self-pay

## 2016-07-05 DIAGNOSIS — J301 Allergic rhinitis due to pollen: Secondary | ICD-10-CM | POA: Diagnosis not present

## 2016-07-05 DIAGNOSIS — J209 Acute bronchitis, unspecified: Secondary | ICD-10-CM | POA: Diagnosis not present

## 2016-07-05 DIAGNOSIS — J3089 Other allergic rhinitis: Secondary | ICD-10-CM | POA: Diagnosis not present

## 2016-07-05 DIAGNOSIS — J4530 Mild persistent asthma, uncomplicated: Secondary | ICD-10-CM | POA: Diagnosis not present

## 2016-07-05 NOTE — Telephone Encounter (Signed)
Spoke with [name] and explained the PREP which she's been referred in to by outpatient PT.  She said she was heading to a doctor's appointment and her ride was there to take her and she'd call me when she was finished.

## 2016-07-06 DIAGNOSIS — F3113 Bipolar disorder, current episode manic without psychotic features, severe: Secondary | ICD-10-CM | POA: Diagnosis not present

## 2016-07-11 ENCOUNTER — Encounter: Payer: Self-pay | Admitting: Physical Therapy

## 2016-07-11 ENCOUNTER — Ambulatory Visit: Payer: Medicare Other | Admitting: Physical Therapy

## 2016-07-11 DIAGNOSIS — H01004 Unspecified blepharitis left upper eyelid: Secondary | ICD-10-CM | POA: Diagnosis not present

## 2016-07-11 DIAGNOSIS — J3089 Other allergic rhinitis: Secondary | ICD-10-CM | POA: Diagnosis not present

## 2016-07-11 DIAGNOSIS — R252 Cramp and spasm: Secondary | ICD-10-CM | POA: Diagnosis not present

## 2016-07-11 DIAGNOSIS — M6281 Muscle weakness (generalized): Secondary | ICD-10-CM

## 2016-07-11 DIAGNOSIS — H43812 Vitreous degeneration, left eye: Secondary | ICD-10-CM | POA: Diagnosis not present

## 2016-07-11 DIAGNOSIS — R293 Abnormal posture: Secondary | ICD-10-CM | POA: Diagnosis not present

## 2016-07-11 DIAGNOSIS — M25552 Pain in left hip: Secondary | ICD-10-CM | POA: Diagnosis not present

## 2016-07-11 DIAGNOSIS — G44209 Tension-type headache, unspecified, not intractable: Secondary | ICD-10-CM | POA: Diagnosis not present

## 2016-07-11 DIAGNOSIS — J301 Allergic rhinitis due to pollen: Secondary | ICD-10-CM | POA: Diagnosis not present

## 2016-07-11 DIAGNOSIS — J3081 Allergic rhinitis due to animal (cat) (dog) hair and dander: Secondary | ICD-10-CM | POA: Diagnosis not present

## 2016-07-11 DIAGNOSIS — H01005 Unspecified blepharitis left lower eyelid: Secondary | ICD-10-CM | POA: Diagnosis not present

## 2016-07-11 NOTE — Therapy (Addendum)
[facility] [address] [place], [place], [address] Phone: [phone]   Fax:  [phone]  Physical Therapy Treatment  Patient Details  Name: Danielle Bruce MRN: [id number] Date of Birth: [birth year] Referring Provider: Mayra Neer, MD  Encounter Date: 07/11/2016      PT End of Session - 07/11/16 8338    Visit Number 25   Number of Visits 30   Date for PT Re-Evaluation 07/05/16   Authorization Type Gcodes at 30th visit; KX at 83   PT Start Time 1236   PT Stop Time 1315   PT Time Calculation (min) 39 min   Activity Tolerance Patient tolerated treatment well   Behavior During Therapy [facility] for tasks assessed/performed      Past Medical History:  Diagnosis Date  . Anemia   . Anxiety   . Arthritis    osteoarthritis  . ASCUS (atypical squamous cells of undetermined significance) on Pap smear 05/06/05   NEG HIGH RISK HPV--C&B BIOPSY BENIGN 12/2005  . Asthma   . Bipolar 2 disorder ([place])   . Cancer ([place])    skin cancer - basal cell  . Colon polyps   . Complication of anesthesia    anxious afterwards, will get headaches   . Constipation   . Depression   . Fibromyalgia 10/2013  . GERD (gastroesophageal reflux disease)   . 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 Heart murmur    never had any problems  . Hemorrhoids   . High cholesterol   . 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   cytology negative  . IBS (irritable bowel syndrome)   . Insomnia   . Lymphocytic colitis   . 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 Migraines   . Osteoarthritis   . Osteopenia   . Peripheral neuropathy   . PTSD (post-traumatic stress disorder)     Past Surgical History:  Procedure Laterality Date  . BONE MARROW BIOPSY Left 12/18/2013   Plasma cell dyscrasia 8% population of plasma cells  . BREAST BIOPSY Right    benign stereo  . CESAREAN SECTION  [phone]   . CHOLECYSTECTOMY N/A 10/16/2012   Procedure: LAPAROSCOPIC CHOLECYSTECTOMY;  Surgeon: Joyice Faster. Cornett, MD;  [facility];  Service: General;  Laterality: N/A;  . COLONOSCOPY     numerous times  . DILATION AND CURETTAGE OF UTERUS    . ESOPHAGOGASTRODUODENOSCOPY    . [place]   x3  . IUD REMOVAL  02/2015   Mirena  . LAPAROSCOPIC LYSIS OF ADHESIONS N/A 10/16/2012   Procedure: LAPAROSCOPIC LYSIS OF ADHESIONS;  Surgeon: Joyice Faster. Cornett, MD;  [facility];  Service: General;  Laterality: N/A;  . LAPAROSCOPY N/A 10/16/2012   Procedure: LAPAROSCOPY DIAGNOSTIC;  Surgeon: Joyice Faster. Cornett, MD;  [facility];  Service: General;  Laterality: N/A;  . PELVIC LAPAROSCOPY    . RADIOLOGY WITH ANESTHESIA N/A 12/16/2015   Procedure: MRI OF BRAIN WITH AND WITHOUT CONTRAST;  Surgeon: Medication Radiologist, MD;  Location: [facility];  Service: Radiology;  Laterality: N/A;  . SHOULDER SURGERY  2007/2008  . SPINE SURGERY  2010   cervical    There were no vitals filed for this visit.      Subjective Assessment - 07/11/16 1241    Subjective My hip only hurt one time all weekend and was able to do the stairs.   Pertinent  History fibromyalgia, cervical fusion, migrains.  Pt goes by "Danielle Bruce"   Limitations Reading;Walking   Currently in Pain? No/denies                         [facility] Adult PT Treatment/Exercise - 07/11/16 0001      Knee/Hip Exercises: Aerobic   Elliptical 8 min  therapist present to discuss treatment     Knee/Hip Exercises: Machines for Strengthening   Cybex Leg Press bilateral 90# 20x, single leg with yellow band pull 30# x15; 35# x 15     Knee/Hip Exercises: Standing   Forward Step Up Step Height: 6";Left;10 reps  tactile cues for knee alignment   Other Standing Knee Exercises hip series 3 ways - 15x each way     Knee/Hip Exercises: Supine   Straight Leg Raises Strengthening;Left;20 reps  hip series 4 ways 1lb                   PT Short Term Goals - 07/11/16 1302      PT SHORT TERM GOAL #1   Title be independent in initial HEP for LE   Time 4   Period Weeks   Status Achieved     PT SHORT TERM GOAL #2   Title increased hip strength to 4/5 bilateral hip abduction and adduction for improved gait   Time 4   Period Weeks   Status Achieved     PT SHORT TERM GOAL #3   Title able to read for 30 minutes without inducing a HA   Time 4   Period Weeks   Status Achieved           PT Long Term Goals - 07/11/16 1259      PT LONG TERM GOAL #1   Title be independent in advanced HEP   Time 8   Period Weeks   Status Achieved     PT LONG TERM GOAL #2   Title able to walk around the mall with her friend with 60% reduction in pain   Time 8   Period Weeks   Status Achieved     PT LONG TERM GOAL #3   Title report 75% less HA throughout the day   Time 8   Period Weeks   Status Achieved     PT LONG TERM GOAL #4   Title demonstrate 4+/5 bilateral hip strength without increased pain   Time 8   Period Weeks   Status Not Met     PT LONG TERM GOAL #5   Title able to go up and down one flight of stairs without increased pain   Time 8   Period Weeks   Status Achieved               Plan - 07/11/16 1317    Clinical Impression Statement Patient has been improving with decreased hip pain.  She has met almost all of her long term goals.  She is able to progress hip strengthening on her own at this time and has been able to reutrn to walking and stairs wihtout hip pain. Pt is safe to discharge with HEP at this time.   Rehab Potential Excellent   Clinical Impairments Affecting Rehab Potential fibromyalgia, cervical fusions, migrains   PT Treatment/Interventions ADLs/Self Care Home Management;Cryotherapy;Electrical Stimulation;Moist Heat;Traction;Functional mobility training;Therapeutic activities;Therapeutic exercise;Balance training;Neuromuscular re-education;Patient/family education;Manual  techniques;Passive range of motion;Dry needling;Taping;Ultrasound   Consulted and Agree with Plan of Care Patient  Patient will benefit from skilled therapeutic intervention in order to improve the following deficits and impairments:  Increased muscle spasms, Increased fascial restricitons, Pain, Postural dysfunction, Abnormal gait, Decreased strength, Difficulty walking  Visit Diagnosis: Muscle weakness (generalized)  Pain in left hip  Cramp and spasm  Abnormal posture  Tension-type headache, not intractable, unspecified chronicity pattern   Gcodes: mobility walking - clinical reasoning and FOTO Current: CK Goal: CJ Discharge: CK  Problem List Patient Active Problem List   Diagnosis Date Noted  . Fibromyalgia 01/13/2016  . MGUS (monoclonal gammopathy of unknown significance) 12/23/2013  . Chronic cholecystitis without calculus 10/18/2012  . Abdominal pain, unspecified site 10/18/2012  . Nausea alone 10/18/2012  . Unspecified constipation 10/18/2012  . Depression 10/18/2012  . Bipolar 2 disorder ([place]) 10/18/2012  . Anxiety   . ASCUS (atypical squamous cells of undetermined significance) on Pap smear   . IUD   . Hemorrhoids 11/29/2010  . Abdominal pain, left upper quadrant 11/29/2010    Zannie Cove, PT 07/11/2016, 1:22 PM  [facility] [address], [place], [place], [address] Phone: [phone]   Fax:  [phone]  Name: Danielle Bruce MRN: [id number] Date of Birth: 01-22-1961  PHYSICAL THERAPY DISCHARGE SUMMARY  Visits from Start of Care: 25  Current functional level related to goals / functional outcomes: See above goals   Remaining deficits: See above details   Education / Equipment: HEP Plan: Patient agrees to discharge.  Patient goals were partially met. Patient is being discharged due to being pleased with the current functional level.  ?????   Google, PT 07/11/16 1:24  PM

## 2016-07-13 ENCOUNTER — Ambulatory Visit: Payer: Medicare Other | Admitting: Physical Therapy

## 2016-07-14 DIAGNOSIS — M19072 Primary osteoarthritis, left ankle and foot: Secondary | ICD-10-CM | POA: Insufficient documentation

## 2016-07-14 DIAGNOSIS — R5383 Other fatigue: Secondary | ICD-10-CM | POA: Insufficient documentation

## 2016-07-14 DIAGNOSIS — M19042 Primary osteoarthritis, left hand: Secondary | ICD-10-CM

## 2016-07-14 DIAGNOSIS — M5030 Other cervical disc degeneration, unspecified cervical region: Secondary | ICD-10-CM | POA: Insufficient documentation

## 2016-07-14 DIAGNOSIS — Z8719 Personal history of other diseases of the digestive system: Secondary | ICD-10-CM | POA: Insufficient documentation

## 2016-07-14 DIAGNOSIS — M8589 Other specified disorders of bone density and structure, multiple sites: Secondary | ICD-10-CM | POA: Insufficient documentation

## 2016-07-14 DIAGNOSIS — M19071 Primary osteoarthritis, right ankle and foot: Secondary | ICD-10-CM | POA: Insufficient documentation

## 2016-07-14 DIAGNOSIS — M19041 Primary osteoarthritis, right hand: Secondary | ICD-10-CM | POA: Insufficient documentation

## 2016-07-17 ENCOUNTER — Ambulatory Visit: Payer: Medicare Other | Admitting: Rheumatology

## 2016-07-27 ENCOUNTER — Ambulatory Visit: Payer: Medicare Other | Admitting: Rheumatology

## 2016-08-01 ENCOUNTER — Ambulatory Visit (INDEPENDENT_AMBULATORY_CARE_PROVIDER_SITE_OTHER): Payer: Medicare Other | Admitting: Rheumatology

## 2016-08-01 ENCOUNTER — Encounter: Payer: Self-pay | Admitting: Rheumatology

## 2016-08-01 VITALS — BP 95/60 | HR 73 | Resp 12 | Ht 62.0 in | Wt 118.0 lb

## 2016-08-01 DIAGNOSIS — G4700 Insomnia, unspecified: Secondary | ICD-10-CM | POA: Diagnosis not present

## 2016-08-01 DIAGNOSIS — E7800 Pure hypercholesterolemia, unspecified: Secondary | ICD-10-CM | POA: Diagnosis not present

## 2016-08-01 DIAGNOSIS — F411 Generalized anxiety disorder: Secondary | ICD-10-CM | POA: Diagnosis not present

## 2016-08-01 DIAGNOSIS — M797 Fibromyalgia: Secondary | ICD-10-CM | POA: Diagnosis not present

## 2016-08-01 DIAGNOSIS — R5383 Other fatigue: Secondary | ICD-10-CM | POA: Diagnosis not present

## 2016-08-01 DIAGNOSIS — F900 Attention-deficit hyperactivity disorder, predominantly inattentive type: Secondary | ICD-10-CM | POA: Diagnosis not present

## 2016-08-01 DIAGNOSIS — K589 Irritable bowel syndrome without diarrhea: Secondary | ICD-10-CM | POA: Diagnosis not present

## 2016-08-01 DIAGNOSIS — Z1321 Encounter for screening for nutritional disorder: Secondary | ICD-10-CM

## 2016-08-01 DIAGNOSIS — K5289 Other specified noninfective gastroenteritis and colitis: Secondary | ICD-10-CM | POA: Diagnosis not present

## 2016-08-01 DIAGNOSIS — F3181 Bipolar II disorder: Secondary | ICD-10-CM | POA: Diagnosis not present

## 2016-08-01 DIAGNOSIS — G43909 Migraine, unspecified, not intractable, without status migrainosus: Secondary | ICD-10-CM | POA: Diagnosis not present

## 2016-08-01 LAB — CBC WITH DIFFERENTIAL/PLATELET
Basophils Absolute: 0 cells/uL (ref 0–200)
Basophils Relative: 0 %
Eosinophils Absolute: 118 cells/uL (ref 15–500)
Eosinophils Relative: 2 %
HCT: 43.1 % (ref 35.0–45.0)
Hemoglobin: 14.6 g/dL (ref 11.7–15.5)
Lymphocytes Relative: 34 %
Lymphs Abs: 2006 cells/uL (ref 850–3900)
MCH: 31 pg (ref 27.0–33.0)
MCHC: 33.9 g/dL (ref 32.0–36.0)
MCV: 91.5 fL (ref 80.0–100.0)
MPV: 8.3 fL (ref 7.5–12.5)
Monocytes Absolute: 354 cells/uL (ref 200–950)
Monocytes Relative: 6 %
Neutro Abs: 3422 cells/uL (ref 1500–7800)
Neutrophils Relative %: 58 %
Platelets: 254 10*3/uL (ref 140–400)
RBC: 4.71 MIL/uL (ref 3.80–5.10)
RDW: 13.2 % (ref 11.0–15.0)
WBC: 5.9 10*3/uL (ref 3.8–10.8)

## 2016-08-01 NOTE — Progress Notes (Signed)
Office Visit Note  Patient: Danielle Bruce             Date of Birth: [birth year]           MRN: [id number]             PCP: Mayra Neer, MD Referring: Mayra Neer, MD Visit Date: 08/01/2016 Occupation: @GUAROCC @    Subjective:  No chief complaint on file.   History of Present Illness: Danielle Bruce is a 56 y.o. female   Has good days and bad days.  FMS rated as  2 To  10.  avg is about 3  (30 % of the month). Had to take percocet  Due to pain being so bad. Also had numbness.   Pcp: kimberly shaw.  Activities of Daily Living:  Patient reports morning stiffness for 30 minutes.   Patient Reports nocturnal pain.  Difficulty dressing/grooming: Denies Difficulty climbing stairs: Denies Difficulty getting out of chair: Denies Difficulty using hands for taps, buttons, cutlery, and/or writing: Denies   Review of Systems  Constitutional: Positive for fatigue.  HENT: Negative for mouth sores and mouth dryness.   Eyes: Negative for dryness.  Respiratory: Negative for shortness of breath.   Gastrointestinal: Negative for constipation and diarrhea.  Musculoskeletal: Positive for myalgias and myalgias.  Skin: Negative for sensitivity to sunlight.  Psychiatric/Behavioral: Positive for sleep disturbance. Negative for decreased concentration.    PMFS History:  Patient Active Problem List   Diagnosis Date Noted  . DDD (degenerative disc disease), cervical 07/14/2016  . Primary osteoarthritis of both feet 07/14/2016  . Primary osteoarthritis of both hands 07/14/2016  . Other fatigue 07/14/2016  . History of IBS 07/14/2016  . Osteopenia of multiple sites 07/14/2016  . Fibromyalgia 01/13/2016  . MGUS (monoclonal gammopathy of unknown significance) 12/23/2013  . Chronic cholecystitis without calculus 10/18/2012  . Abdominal pain, unspecified site 10/18/2012  . Nausea alone 10/18/2012  . Unspecified constipation 10/18/2012  . Depression 10/18/2012  . Bipolar 2 disorder  ([place]) 10/18/2012  . Anxiety   . ASCUS (atypical squamous cells of undetermined significance) on Pap smear   . IUD   . Hemorrhoids 11/29/2010  . Abdominal pain, left upper quadrant 11/29/2010    Past Medical History:  Diagnosis Date  . Anemia   . Anxiety   . Arthritis    osteoarthritis  . ASCUS (atypical squamous cells of undetermined significance) on Pap smear 05/06/05   NEG HIGH RISK HPV--C&B BIOPSY BENIGN 12/2005  . Asthma   . Bipolar 2 disorder ([place])   . Cancer ([place])    skin cancer - basal cell  . Colon polyps   . Complication of anesthesia    anxious afterwards, will get headaches   . Constipation   . Depression   . Fibromyalgia 10/2013  . GERD (gastroesophageal reflux disease)   . 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 Heart murmur    never had any problems  . Hemorrhoids   . High cholesterol   . 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   cytology negative  . IBS (irritable bowel syndrome)   . Insomnia   . Lymphocytic colitis   . 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 Migraines   . Osteoarthritis   . Osteopenia   . Peripheral neuropathy   . PTSD (post-traumatic stress disorder)     Family History  Problem Relation Age of Onset  . Diabetes Father   .  Hypertension Father   . Hyperlipidemia Father   . Heart disease Maternal Grandfather   . Heart disease Paternal Grandmother   . Heart disease Paternal Grandfather    Past Surgical History:  Procedure Laterality Date  . BONE MARROW BIOPSY Left 12/18/2013   Plasma cell dyscrasia 8% population of plasma cells  . BREAST BIOPSY Right    benign stereo  . CESAREAN SECTION  [phone]  . CHOLECYSTECTOMY N/A 10/16/2012   Procedure: LAPAROSCOPIC CHOLECYSTECTOMY;  Surgeon: Joyice Faster. Cornett, MD;  [facility];  Service: General;  Laterality: N/A;  . COLONOSCOPY     numerous times  . DILATION AND CURETTAGE OF UTERUS    . ESOPHAGOGASTRODUODENOSCOPY    .  [place]   x3  . IUD REMOVAL  02/2015   Mirena  . LAPAROSCOPIC LYSIS OF ADHESIONS N/A 10/16/2012   Procedure: LAPAROSCOPIC LYSIS OF ADHESIONS;  Surgeon: Joyice Faster. Cornett, MD;  [facility];  Service: General;  Laterality: N/A;  . LAPAROSCOPY N/A 10/16/2012   Procedure: LAPAROSCOPY DIAGNOSTIC;  Surgeon: Joyice Faster. Cornett, MD;  [facility];  Service: General;  Laterality: N/A;  . PELVIC LAPAROSCOPY    . RADIOLOGY WITH ANESTHESIA N/A 12/16/2015   Procedure: MRI OF BRAIN WITH AND WITHOUT CONTRAST;  Surgeon: Medication Radiologist, MD;  Location: [facility];  Service: Radiology;  Laterality: N/A;  . SHOULDER SURGERY  2007/2008  . SPINE SURGERY  2010   cervical   Social History   Social History Narrative  . No narrative on file     Objective: Vital Signs: BP 95/60   Pulse 73   Resp 12   Ht 5' 2"  (1.575 m)   Wt 118 lb (53.5 kg)   LMP 12/28/2010   BMI 21.58 kg/m    Physical Exam  Constitutional: She is oriented to person, place, and time. She appears well-developed and well-nourished.  HENT:  Head: Normocephalic and atraumatic.  Eyes: EOM are normal. Pupils are equal, round, and reactive to light.  Cardiovascular: Normal rate, regular rhythm and normal heart sounds.  Exam reveals no gallop and no friction rub.   No murmur heard. Pulmonary/Chest: Effort normal and breath sounds normal. She has no wheezes. She has no rales.  Abdominal: Soft. Bowel sounds are normal. She exhibits no distension. There is no tenderness. There is no guarding. No hernia.  Musculoskeletal: Normal range of motion. She exhibits no edema, tenderness or deformity.  Lymphadenopathy:    She has no cervical adenopathy.  Neurological: She is alert and oriented to person, place, and time. Coordination normal.  Skin: Skin is warm and dry. Capillary refill takes less than 2 seconds. No rash noted.  Psychiatric: She has a normal mood and affect. Her behavior is normal.  Nursing note and vitals  reviewed.    Musculoskeletal Exam:  Full range of motion of all joints Grip strength is equal and strong bilaterally Fiber myalgia tender points are 18 out of 18 positive  CDAI Exam: CDAI Homunculus Exam:   Joint Counts:  CDAI Tender Joint count: 0 CDAI Swollen Joint count: 0  Global Assessments:  Patient Global Assessment: 7 Provider Global Assessment: 7  CDAI Calculated Score: 14 No synovitis on exam   Investigation: No additional findings. Last labs were patient is from October 2017. We need updated labs today.  Imaging: No results found.  Speciality Comments: No specialty comments available.    Procedures:  No procedures performed Allergies: Sulfa antibiotics; Gluten meal; Lactose intolerance (gi); Flagyl [metronidazole hcl];  and Morphine and related   Assessment / Plan:     Visit Diagnoses: Fibromyalgia - Plan: CBC with Differential/Platelet, COMPLETE METABOLIC PANEL WITH GFR, VITAMIN D 25 Hydroxy (Vit-D Deficiency, Fractures)  Fatigue, unspecified type - Plan: CBC with Differential/Platelet, COMPLETE METABOLIC PANEL WITH GFR, VITAMIN D 25 Hydroxy (Vit-D Deficiency, Fractures)  Insomnia, unspecified type  Encounter for vitamin deficiency screening - Plan: VITAMIN D 25 Hydroxy (Vit-D Deficiency, Fractures)   Right Achilles tendon pain. I suspect this pain is coming from the patient doing some extensive walking yesterday as well as this past week when she was at the beach (walk 2 miles in the sand one way and another 2 miles to get back.).  Return to clinic in 6 months  We discussed important strategies to minimize fibromyalgia flares.; Exercise; etc.  Orders: Orders Placed This Encounter  Procedures  . CBC with Differential/Platelet  . COMPLETE METABOLIC PANEL WITH GFR  . VITAMIN D 25 Hydroxy (Vit-D Deficiency, Fractures)   No orders of the defined types were placed in this encounter.   Face-to-face time spent with patient was 30 minutes. 50% of  time was spent in counseling and coordination of care.  Follow-Up Instructions: Return in about 6 months (around 01/31/2017) for FMS, FATIGUE,INSOMNIA, rt achilles tendon pain .   Eliezer Lofts, PA-C  Note - This record has been created using [employer].  Chart creation errors have been sought, but may not always  have been located. Such creation errors do not reflect on  the standard of medical care.

## 2016-08-02 LAB — COMPLETE METABOLIC PANEL WITH GFR
ALT: 26 U/L (ref 6–29)
AST: 23 U/L (ref 10–35)
Albumin: 4.8 g/dL (ref 3.6–5.1)
Alkaline Phosphatase: 55 U/L (ref 33–130)
BUN: 14 mg/dL (ref 7–25)
CO2: 23 mmol/L (ref 20–31)
Calcium: 10 mg/dL (ref 8.6–10.4)
Chloride: 107 mmol/L (ref 98–110)
Creat: 0.87 mg/dL (ref 0.50–1.05)
GFR, Est African American: 88 mL/min (ref >=60)
GFR, Est Non African American: 76 mL/min (ref >=60)
Glucose, Bld: 95 mg/dL (ref 65–99)
Potassium: 4.7 mmol/L (ref 3.5–5.3)
Sodium: 142 mmol/L (ref 135–146)
Total Bilirubin: 0.7 mg/dL (ref 0.2–1.2)
Total Protein: 7.4 g/dL (ref 6.1–8.1)

## 2016-08-02 LAB — VITAMIN D 25 HYDROXY (VIT D DEFICIENCY, FRACTURES): Vit D, 25-Hydroxy: 52 ng/mL (ref 30–100)

## 2016-08-04 DIAGNOSIS — J3089 Other allergic rhinitis: Secondary | ICD-10-CM | POA: Diagnosis not present

## 2016-08-04 DIAGNOSIS — J3081 Allergic rhinitis due to animal (cat) (dog) hair and dander: Secondary | ICD-10-CM | POA: Diagnosis not present

## 2016-08-04 DIAGNOSIS — J301 Allergic rhinitis due to pollen: Secondary | ICD-10-CM | POA: Diagnosis not present

## 2016-08-07 ENCOUNTER — Telehealth: Payer: Self-pay

## 2016-08-07 NOTE — Telephone Encounter (Signed)
Left VM in reference to the PREP referral for Danielle Bruce.

## 2016-08-08 DIAGNOSIS — J3089 Other allergic rhinitis: Secondary | ICD-10-CM | POA: Diagnosis not present

## 2016-08-08 DIAGNOSIS — J3081 Allergic rhinitis due to animal (cat) (dog) hair and dander: Secondary | ICD-10-CM | POA: Diagnosis not present

## 2016-08-08 DIAGNOSIS — J301 Allergic rhinitis due to pollen: Secondary | ICD-10-CM | POA: Diagnosis not present

## 2016-08-14 DIAGNOSIS — H43812 Vitreous degeneration, left eye: Secondary | ICD-10-CM | POA: Diagnosis not present

## 2016-08-17 DIAGNOSIS — F3113 Bipolar disorder, current episode manic without psychotic features, severe: Secondary | ICD-10-CM | POA: Diagnosis not present

## 2016-08-18 DIAGNOSIS — J3089 Other allergic rhinitis: Secondary | ICD-10-CM | POA: Diagnosis not present

## 2016-08-18 DIAGNOSIS — J3081 Allergic rhinitis due to animal (cat) (dog) hair and dander: Secondary | ICD-10-CM | POA: Diagnosis not present

## 2016-08-18 DIAGNOSIS — J301 Allergic rhinitis due to pollen: Secondary | ICD-10-CM | POA: Diagnosis not present

## 2016-08-23 DIAGNOSIS — J3081 Allergic rhinitis due to animal (cat) (dog) hair and dander: Secondary | ICD-10-CM | POA: Diagnosis not present

## 2016-08-23 DIAGNOSIS — J3089 Other allergic rhinitis: Secondary | ICD-10-CM | POA: Diagnosis not present

## 2016-08-23 DIAGNOSIS — J301 Allergic rhinitis due to pollen: Secondary | ICD-10-CM | POA: Diagnosis not present

## 2016-08-25 DIAGNOSIS — J3089 Other allergic rhinitis: Secondary | ICD-10-CM | POA: Diagnosis not present

## 2016-08-25 DIAGNOSIS — J301 Allergic rhinitis due to pollen: Secondary | ICD-10-CM | POA: Diagnosis not present

## 2016-08-25 DIAGNOSIS — J3081 Allergic rhinitis due to animal (cat) (dog) hair and dander: Secondary | ICD-10-CM | POA: Diagnosis not present

## 2016-09-01 DIAGNOSIS — J3081 Allergic rhinitis due to animal (cat) (dog) hair and dander: Secondary | ICD-10-CM | POA: Diagnosis not present

## 2016-09-01 DIAGNOSIS — J3089 Other allergic rhinitis: Secondary | ICD-10-CM | POA: Diagnosis not present

## 2016-09-01 DIAGNOSIS — J301 Allergic rhinitis due to pollen: Secondary | ICD-10-CM | POA: Diagnosis not present

## 2016-09-08 DIAGNOSIS — J301 Allergic rhinitis due to pollen: Secondary | ICD-10-CM | POA: Diagnosis not present

## 2016-09-08 DIAGNOSIS — J3089 Other allergic rhinitis: Secondary | ICD-10-CM | POA: Diagnosis not present

## 2016-09-08 DIAGNOSIS — J3081 Allergic rhinitis due to animal (cat) (dog) hair and dander: Secondary | ICD-10-CM | POA: Diagnosis not present

## 2016-09-13 DIAGNOSIS — J301 Allergic rhinitis due to pollen: Secondary | ICD-10-CM | POA: Diagnosis not present

## 2016-09-13 DIAGNOSIS — J3081 Allergic rhinitis due to animal (cat) (dog) hair and dander: Secondary | ICD-10-CM | POA: Diagnosis not present

## 2016-09-13 DIAGNOSIS — J3089 Other allergic rhinitis: Secondary | ICD-10-CM | POA: Diagnosis not present

## 2016-09-28 DIAGNOSIS — J3081 Allergic rhinitis due to animal (cat) (dog) hair and dander: Secondary | ICD-10-CM | POA: Diagnosis not present

## 2016-09-28 DIAGNOSIS — J301 Allergic rhinitis due to pollen: Secondary | ICD-10-CM | POA: Diagnosis not present

## 2016-09-28 DIAGNOSIS — J3089 Other allergic rhinitis: Secondary | ICD-10-CM | POA: Diagnosis not present

## 2016-10-10 DIAGNOSIS — R05 Cough: Secondary | ICD-10-CM | POA: Diagnosis not present

## 2016-10-10 DIAGNOSIS — R0982 Postnasal drip: Secondary | ICD-10-CM | POA: Diagnosis not present

## 2016-10-10 DIAGNOSIS — J069 Acute upper respiratory infection, unspecified: Secondary | ICD-10-CM | POA: Diagnosis not present

## 2016-10-18 DIAGNOSIS — K589 Irritable bowel syndrome without diarrhea: Secondary | ICD-10-CM | POA: Diagnosis not present

## 2016-10-18 DIAGNOSIS — E7800 Pure hypercholesterolemia, unspecified: Secondary | ICD-10-CM | POA: Diagnosis not present

## 2016-10-18 DIAGNOSIS — M797 Fibromyalgia: Secondary | ICD-10-CM | POA: Diagnosis not present

## 2016-10-18 DIAGNOSIS — F411 Generalized anxiety disorder: Secondary | ICD-10-CM | POA: Diagnosis not present

## 2016-10-18 DIAGNOSIS — F900 Attention-deficit hyperactivity disorder, predominantly inattentive type: Secondary | ICD-10-CM | POA: Diagnosis not present

## 2016-10-18 DIAGNOSIS — G4700 Insomnia, unspecified: Secondary | ICD-10-CM | POA: Diagnosis not present

## 2016-10-18 DIAGNOSIS — J45909 Unspecified asthma, uncomplicated: Secondary | ICD-10-CM | POA: Diagnosis not present

## 2016-10-18 DIAGNOSIS — F3181 Bipolar II disorder: Secondary | ICD-10-CM | POA: Diagnosis not present

## 2016-10-18 DIAGNOSIS — G43909 Migraine, unspecified, not intractable, without status migrainosus: Secondary | ICD-10-CM | POA: Diagnosis not present

## 2016-10-18 DIAGNOSIS — F4310 Post-traumatic stress disorder, unspecified: Secondary | ICD-10-CM | POA: Diagnosis not present

## 2016-10-25 ENCOUNTER — Encounter: Payer: Self-pay | Admitting: Gynecology

## 2016-10-25 ENCOUNTER — Ambulatory Visit (INDEPENDENT_AMBULATORY_CARE_PROVIDER_SITE_OTHER): Payer: Medicare Other | Admitting: Gynecology

## 2016-10-25 VITALS — BP 116/74

## 2016-10-25 DIAGNOSIS — N898 Other specified noninflammatory disorders of vagina: Secondary | ICD-10-CM

## 2016-10-25 LAB — WET PREP FOR TRICH, YEAST, CLUE
Trich, Wet Prep: NONE SEEN
Yeast Wet Prep HPF POC: NONE SEEN

## 2016-10-25 MED ORDER — CLINDAMYCIN PHOSPHATE 2 % VA CREA: 1.0000 | TOPICAL_CREAM | Freq: Every day | VAGINAL | 0 refills | Status: DC

## 2016-10-25 NOTE — Patient Instructions (Signed)
Use the vaginal cream nightly for 7 nights.  Call if you have any recurrence of your symptoms

## 2016-10-25 NOTE — Progress Notes (Signed)
    Danielle Bruce May 07, 1960 [id number]        55 y.o.  [id number] presents with episode of pinkish discharge last night. Was having a difficult bowel movement and with wiping she saw some pink discharge. Does have a history with hemorrhoids. She was unsure whether it was from the hemorrhoids or from her vagina. She rechecked this morning and noticed no blood. We had discussed HRT previously for menopausal symptoms but she never initiated this. Has history of ultrasound 2017 with endometrial echo 2.8 mm   Past medical history,surgical history, problem list, medications, allergies, family history and social history were all reviewed and documented in the EPIC chart.  Directed ROS with pertinent positives and negatives documented in the history of present illness/assessment and plan.  Exam: Danielle Bruce assistant Vitals:   10/25/16 1119  BP: 116/74   General appearance:  Normal Abdomen soft nontender without masses guarding rebound Pelvic external BUS vagina with white scant discharge. Cervix normal. Uterus normal size midline mobile nontender. Adnexa without masses or tenderness.   Assessment/Plan:  56 y.o. [id number] with history and exam as above. Wet prep did suggest bacterial vaginosis. I reviewed the differential with the patient to include uterine source, vaginal source or her hemorrhoids. At this point we'll treat as a bacterial vaginosis with Cleocin vaginal cream daily 7 nights. Will follow and as long as no further discharge and will monitor. If she has persistent discharge or any pinkish changes she knows to call and we will schedule an ultrasound to look at the endometrial echo.     Anastasio Auerbach MD, 11:28 AM 10/25/2016

## 2016-10-26 DIAGNOSIS — Z85828 Personal history of other malignant neoplasm of skin: Secondary | ICD-10-CM | POA: Diagnosis not present

## 2016-10-26 DIAGNOSIS — L821 Other seborrheic keratosis: Secondary | ICD-10-CM | POA: Diagnosis not present

## 2016-10-26 DIAGNOSIS — D225 Melanocytic nevi of trunk: Secondary | ICD-10-CM | POA: Diagnosis not present

## 2016-10-26 DIAGNOSIS — L814 Other melanin hyperpigmentation: Secondary | ICD-10-CM | POA: Diagnosis not present

## 2016-10-26 DIAGNOSIS — D2261 Melanocytic nevi of right upper limb, including shoulder: Secondary | ICD-10-CM | POA: Diagnosis not present

## 2016-10-26 DIAGNOSIS — D2262 Melanocytic nevi of left upper limb, including shoulder: Secondary | ICD-10-CM | POA: Diagnosis not present

## 2016-10-26 DIAGNOSIS — C44619 Basal cell carcinoma of skin of left upper limb, including shoulder: Secondary | ICD-10-CM | POA: Diagnosis not present

## 2016-10-26 DIAGNOSIS — D2271 Melanocytic nevi of right lower limb, including hip: Secondary | ICD-10-CM | POA: Diagnosis not present

## 2016-10-26 DIAGNOSIS — D1801 Hemangioma of skin and subcutaneous tissue: Secondary | ICD-10-CM | POA: Diagnosis not present

## 2016-11-01 ENCOUNTER — Telehealth: Payer: Self-pay | Admitting: Rheumatology

## 2016-11-01 NOTE — Telephone Encounter (Signed)
Patient left a message stating a [place] study has come out that voltaren can cause cardiac events and she would like to know Dr. Arlean Hopping opinion on that and would like to know if she should start using the gel or keep taking the oral. Please advise.

## 2016-11-02 NOTE — Telephone Encounter (Signed)
Attempted to contact the patient and left message for patient to call the office.  

## 2016-11-02 NOTE — Telephone Encounter (Signed)
The systemic absorption of Voltaren gel is very limited. OK to use Voltaren gel. I would be concerned about oral NSAIDS

## 2016-11-02 NOTE — Telephone Encounter (Signed)
Patient states there was a study in the [place] of medicine. Patient states there was an increase in cardiac events with Voltaren, such as stroke. Patient states she she is currently on the Voltaren gel. Patient states she only uses on a PRN basis. Patient states she already has other things that raise her risk for cardiac events. Patient would like to know what your thoughts are. Please advise.

## 2016-11-06 DIAGNOSIS — F3113 Bipolar disorder, current episode manic without psychotic features, severe: Secondary | ICD-10-CM | POA: Diagnosis not present

## 2016-11-09 NOTE — Telephone Encounter (Signed)
Patient advised of recommendations and verbalized understanding.  

## 2016-11-15 DIAGNOSIS — F3113 Bipolar disorder, current episode manic without psychotic features, severe: Secondary | ICD-10-CM | POA: Diagnosis not present

## 2016-11-21 DIAGNOSIS — F3113 Bipolar disorder, current episode manic without psychotic features, severe: Secondary | ICD-10-CM | POA: Diagnosis not present

## 2016-11-24 DIAGNOSIS — J3089 Other allergic rhinitis: Secondary | ICD-10-CM | POA: Diagnosis not present

## 2016-11-24 DIAGNOSIS — J3081 Allergic rhinitis due to animal (cat) (dog) hair and dander: Secondary | ICD-10-CM | POA: Diagnosis not present

## 2016-11-24 DIAGNOSIS — J301 Allergic rhinitis due to pollen: Secondary | ICD-10-CM | POA: Diagnosis not present

## 2016-11-27 DIAGNOSIS — F411 Generalized anxiety disorder: Secondary | ICD-10-CM | POA: Diagnosis not present

## 2016-11-27 DIAGNOSIS — J45909 Unspecified asthma, uncomplicated: Secondary | ICD-10-CM | POA: Diagnosis not present

## 2016-11-27 DIAGNOSIS — E7800 Pure hypercholesterolemia, unspecified: Secondary | ICD-10-CM | POA: Diagnosis not present

## 2016-11-27 DIAGNOSIS — R442 Other hallucinations: Secondary | ICD-10-CM | POA: Diagnosis not present

## 2016-11-27 DIAGNOSIS — Z23 Encounter for immunization: Secondary | ICD-10-CM | POA: Diagnosis not present

## 2016-11-27 DIAGNOSIS — F900 Attention-deficit hyperactivity disorder, predominantly inattentive type: Secondary | ICD-10-CM | POA: Diagnosis not present

## 2016-11-27 DIAGNOSIS — G43909 Migraine, unspecified, not intractable, without status migrainosus: Secondary | ICD-10-CM | POA: Diagnosis not present

## 2016-11-27 DIAGNOSIS — F3181 Bipolar II disorder: Secondary | ICD-10-CM | POA: Diagnosis not present

## 2016-11-27 DIAGNOSIS — F4310 Post-traumatic stress disorder, unspecified: Secondary | ICD-10-CM | POA: Diagnosis not present

## 2016-11-27 DIAGNOSIS — G4700 Insomnia, unspecified: Secondary | ICD-10-CM | POA: Diagnosis not present

## 2016-11-28 DIAGNOSIS — F3113 Bipolar disorder, current episode manic without psychotic features, severe: Secondary | ICD-10-CM | POA: Diagnosis not present

## 2016-12-05 DIAGNOSIS — F3113 Bipolar disorder, current episode manic without psychotic features, severe: Secondary | ICD-10-CM | POA: Diagnosis not present

## 2016-12-06 ENCOUNTER — Encounter: Payer: Self-pay | Admitting: Physical Therapy

## 2016-12-06 ENCOUNTER — Ambulatory Visit: Payer: Medicare Other | Attending: Family Medicine | Admitting: Physical Therapy

## 2016-12-06 DIAGNOSIS — J301 Allergic rhinitis due to pollen: Secondary | ICD-10-CM | POA: Diagnosis not present

## 2016-12-06 DIAGNOSIS — M6281 Muscle weakness (generalized): Secondary | ICD-10-CM | POA: Diagnosis not present

## 2016-12-06 DIAGNOSIS — M62838 Other muscle spasm: Secondary | ICD-10-CM | POA: Diagnosis not present

## 2016-12-06 DIAGNOSIS — J3089 Other allergic rhinitis: Secondary | ICD-10-CM | POA: Diagnosis not present

## 2016-12-06 DIAGNOSIS — M542 Cervicalgia: Secondary | ICD-10-CM | POA: Diagnosis not present

## 2016-12-06 DIAGNOSIS — J3081 Allergic rhinitis due to animal (cat) (dog) hair and dander: Secondary | ICD-10-CM | POA: Diagnosis not present

## 2016-12-06 NOTE — Therapy (Signed)
[facility] [address] [place], [place], [address] Phone: [phone]   Fax:  [phone]  Physical Therapy Evaluation  Patient Details  Name: Danielle Bruce MRN: [id number] Date of Birth: 08/07/1960 Referring Provider: Mayra Neer  Encounter Date: 12/06/2016      PT End of Session - 12/06/16 1325    Visit Number 1   Number of Visits 10   Date for PT Re-Evaluation 01/31/17   Authorization Type gcodes at 10th visit   PT Start Time 1146   PT Stop Time 1230   PT Time Calculation (min) 44 min   Activity Tolerance Patient tolerated treatment well   Behavior During Therapy [facility] for tasks assessed/performed      Past Medical History:  Diagnosis Date  . Anemia   . Anxiety   . Arthritis    osteoarthritis  . ASCUS (atypical squamous cells of undetermined significance) on Pap smear 05/06/05   NEG HIGH RISK HPV--C&B BIOPSY BENIGN 12/2005  . Asthma   . Bipolar 2 disorder ([place])   . Cancer ([place])    skin cancer - basal cell  . Colon polyps   . Complication of anesthesia    anxious afterwards, will get headaches   . Constipation   . Depression   . Fibromyalgia 10/2013  . GERD (gastroesophageal reflux disease)   . 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 Heart murmur    never had any problems  . Hemorrhoids   . High cholesterol   . 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   cytology negative  . IBS (irritable bowel syndrome)   . Insomnia   . Lymphocytic colitis   . 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 Migraines   . Osteoarthritis   . Osteopenia   . Peripheral neuropathy   . PTSD (post-traumatic stress disorder)     Past Surgical History:  Procedure Laterality Date  . BONE MARROW BIOPSY Left 12/18/2013   Plasma cell dyscrasia 8% population of plasma cells  . BREAST BIOPSY Right    benign stereo  . CESAREAN SECTION  [phone]  .  CHOLECYSTECTOMY N/A 10/16/2012   Procedure: LAPAROSCOPIC CHOLECYSTECTOMY;  Surgeon: Joyice Faster. Cornett, MD;  Location: [facility];  Service: General;  Laterality: N/A;  . COLONOSCOPY     numerous times  . DILATION AND CURETTAGE OF UTERUS    . ESOPHAGOGASTRODUODENOSCOPY    . [place]   x3  . IUD REMOVAL  02/2015   Mirena  . LAPAROSCOPIC LYSIS OF ADHESIONS N/A 10/16/2012   Procedure: LAPAROSCOPIC LYSIS OF ADHESIONS;  Surgeon: Joyice Faster. Cornett, MD;  [facility];  Service: General;  Laterality: N/A;  . LAPAROSCOPY N/A 10/16/2012   Procedure: LAPAROSCOPY DIAGNOSTIC;  Surgeon: Joyice Faster. Cornett, MD;  [facility];  Service: General;  Laterality: N/A;  . PELVIC LAPAROSCOPY    . RADIOLOGY WITH ANESTHESIA N/A 12/16/2015   Procedure: MRI OF BRAIN WITH AND WITHOUT CONTRAST;  Surgeon: Medication Radiologist, MD;  Location: [place];  Service: Radiology;  Laterality: N/A;  . SHOULDER SURGERY  2007/2008  . SPINE SURGERY  2010   cervical    There were no vitals filed for this visit.       Subjective Assessment - 12/06/16 1150    Subjective Was on prednisone and tapered off around the first and that was when I saw Dr. Brigitte Pulse.  I got an  injection.  Sometime a couple days after that my neck started hurting and on the 4th where I couldn't even move.  I got a TENS and have been using that and heat.  It helps but only while it is on.     Pertinent History fibromyalgia, chronic pain   Limitations House hold activities   Patient Stated Goals get rid of the pain   Currently in Pain? Yes   Pain Location Neck   Pain Orientation Right   Pain Descriptors / Indicators Constant;Sore   Pain Radiating Towards down shoulder into right shoulder blade, down into thoracic spine   Pain Onset 1 to 4 weeks ago   Pain Frequency Constant   Aggravating Factors  unknown   Pain Relieving Factors TENS and heat   Effect of Pain on Daily Activities driving and all activities, using right arm   Multiple Pain  Sites No            [facility] PT Assessment - 12/06/16 0001      Assessment   Medical Diagnosis M54.2 Neck pain   Referring Provider Mayra Neer   Onset Date/Surgical Date 11/29/16   Hand Dominance Right   Prior Therapy not for this pain     Precautions   Precautions None     Restrictions   Weight Bearing Restrictions No     Balance Screen   Has the patient fallen in the past 6 months No     [place] residence   Living Arrangements Alone     Prior Function   Level of Independence Independent     Cognition   Overall Cognitive Status Within Functional Limits for tasks assessed     Observation/Other Assessments   Focus on Therapeutic Outcomes (FOTO)  53% limited     Posture/Postural Control   Posture/Postural Control Postural limitations   Postural Limitations Rounded Shoulders;Increased thoracic kyphosis  elevated shoulders     ROM / Strength   AROM / PROM / Strength AROM;Strength     AROM   Overall AROM  Deficits   AROM Assessment Site Cervical   Cervical - Right Side Bend 25% limited  painful, "sticks"   Cervical - Right Rotation 45  "sticks"   Cervical - Left Rotation 60     Strength   Strength Assessment Site Shoulder;Hand   Right/Left Shoulder Right;Left   Right Shoulder Flexion 3+/5   Right Shoulder ABduction 3+/5   Left Shoulder Flexion 4/5   Left Shoulder ABduction 4/5   Right Hand Grip (lbs) 24   Left Hand Grip (lbs) 28     Palpation   Palpation comment tender and tight right paraspinals, suboccipitals, upper trap     Special Tests    Special Tests Cervical   Cervical Tests Dictraction     Distraction Test   Findngs Positive   side --  reduced symptoms            Objective measurements completed on examination: See above findings.                  PT Education - 12/06/16 1327    Education provided Yes   Education Details POC   Person(s) Educated Patient   Methods  Explanation   Comprehension Verbalized understanding          PT Short Term Goals - 12/06/16 1342      PT SHORT TERM GOAL #1   Title be independent in initial HEP  Time 4   Period Weeks   Status New   Target Date 01/03/17     PT SHORT TERM GOAL #2   Title increased shoulder flex and abduction 4/5 bilaterally without increased pain for improved overhead reaching   Time 4   Period Weeks   Status New   Target Date 01/03/17     PT SHORT TERM GOAL #3   Title 50% reduction in muscle spasm for improved ROM during functional activities   Time 4   Period Weeks   Status New   Target Date 01/03/17           PT Long Term Goals - 12/06/16 1344      PT LONG TERM GOAL #1   Title be independent in advanced HEP   Baseline ...   Time 8   Period Weeks   Status New   Target Date 01/31/17     PT LONG TERM GOAL #2   Title able to drive using right UE due to improved strength    Baseline ...   Time 8   Period Weeks   Status New   Target Date 01/31/17     PT LONG TERM GOAL #3   Title report 75% less neck pain throughout the day with normal functional activities   Time 8   Period Weeks   Status New   Target Date 01/31/17     PT LONG TERM GOAL #4   Title able to make the bed without increased pain or muscle spasms   Time 8   Period Weeks   Status New   Target Date 01/31/17     PT LONG TERM GOAL #5   Title .Marland KitchenMarland Kitchen                Plan - 12/06/16 1335    Clinical Impression Statement Patient presents to clinic with recent onset of neck pain and spasm in cervical muscles.  Pt has reduced cervical AROM . Pt has pain with all movements especially right side bend and rotation.  She has weakness bilateral shoulders and grip Rt>Left.  She has palpable muscle spasms.  Pain reduces with cervical distraction.  Skilled PT will be beneficial to address impairments and facilitate return to maximum function.   History and Personal Factors relevant to plan of care: chronic pain,  cervical fusion, fibromyalgia, history of HA   Clinical Presentation Unstable   Clinical Presentation due to: unsure of what causes pain, chronic neck pain   Clinical Decision Making Low   Rehab Potential Excellent   PT Frequency 2x / week   PT Duration 8 weeks   PT Treatment/Interventions ADLs/Self Care Home Management;Biofeedback;Cryotherapy;Electrical Stimulation;Iontophoresis 72m/ml Dexamethasone;Moist Heat;Traction;Ultrasound;Gait training;Functional mobility training;Therapeutic activities;Therapeutic exercise;Neuromuscular re-education;Patient/family education;Manual techniques;Passive range of motion;Dry needling;Taping   PT Next Visit Plan STM, cervical ROM and stretches   Consulted and Agree with Plan of Care Patient      Patient will benefit from skilled therapeutic intervention in order to improve the following deficits and impairments:  Postural dysfunction, Increased muscle spasms, Pain, Decreased strength  Visit Diagnosis: Cervicalgia - Plan: PT plan of care cert/re-cert  Other muscle spasm - Plan: PT plan of care cert/re-cert  Muscle weakness (generalized) - Plan: PT plan of care cert/re-cert      G-Codes - [id number]/[id number]    Functional Assessment Tool Used (Outpatient Only) FOTO and clinical assessment   Functional Limitation Changing and maintaining body position   Changing and Maintaining Body Position Current Status ((Y5035 At  least 40 percent but less than 60 percent impaired, limited or restricted   Changing and Maintaining Body Position Goal Status (X1951) At least 20 percent but less than 40 percent impaired, limited or restricted       Problem List Patient Active Problem List   Diagnosis Date Noted  . DDD (degenerative disc disease), cervical 07/14/2016  . Primary osteoarthritis of both feet 07/14/2016  . Primary osteoarthritis of both hands 07/14/2016  . Other fatigue 07/14/2016  . History of IBS 07/14/2016  . Osteopenia of multiple sites 07/14/2016   . Fibromyalgia 01/13/2016  . MGUS (monoclonal gammopathy of unknown significance) 12/23/2013  . Chronic cholecystitis without calculus 10/18/2012  . Abdominal pain, unspecified site 10/18/2012  . Nausea alone 10/18/2012  . Unspecified constipation 10/18/2012  . Depression 10/18/2012  . Bipolar 2 disorder ([place]) 10/18/2012  . Anxiety   . ASCUS (atypical squamous cells of undetermined significance) on Pap smear   . IUD   . Hemorrhoids 11/29/2010  . Abdominal pain, left upper quadrant 11/29/2010    Zannie Cove , PT 12/06/2016, 1:50 PM  [facility] [address], [place], [place], [address] Phone: [phone]   Fax:  [phone]  Name: Danielle Bruce MRN: [id number] Date of Birth: [birth year]

## 2016-12-12 DIAGNOSIS — F3113 Bipolar disorder, current episode manic without psychotic features, severe: Secondary | ICD-10-CM | POA: Diagnosis not present

## 2016-12-13 ENCOUNTER — Ambulatory Visit: Payer: Medicare Other | Admitting: Physical Therapy

## 2016-12-13 DIAGNOSIS — M6281 Muscle weakness (generalized): Secondary | ICD-10-CM | POA: Diagnosis not present

## 2016-12-13 DIAGNOSIS — J3081 Allergic rhinitis due to animal (cat) (dog) hair and dander: Secondary | ICD-10-CM | POA: Diagnosis not present

## 2016-12-13 DIAGNOSIS — M62838 Other muscle spasm: Secondary | ICD-10-CM

## 2016-12-13 DIAGNOSIS — J3089 Other allergic rhinitis: Secondary | ICD-10-CM | POA: Diagnosis not present

## 2016-12-13 DIAGNOSIS — M542 Cervicalgia: Secondary | ICD-10-CM | POA: Diagnosis not present

## 2016-12-13 DIAGNOSIS — J301 Allergic rhinitis due to pollen: Secondary | ICD-10-CM | POA: Diagnosis not present

## 2016-12-13 NOTE — Therapy (Signed)
[facility] [address] [place], [place], [address] Phone: [phone]   Fax:  [phone]  Physical Therapy Treatment  Patient Details  Name: Danielle Bruce MRN: [id number] Date of Birth: Nov 27, 1960 Referring Provider: Mayra Neer  Encounter Date: 12/13/2016      PT End of Session - 12/13/16 1352    Visit Number 2   Number of Visits 10   Date for PT Re-Evaluation 01/31/17   Authorization Type gcodes at 10th visit   PT Start Time 1233   PT Stop Time 1319   PT Time Calculation (min) 46 min   Activity Tolerance Patient tolerated treatment well   Behavior During Therapy [facility] for tasks assessed/performed      Past Medical History:  Diagnosis Date  . Anemia   . Anxiety   . Arthritis    osteoarthritis  . ASCUS (atypical squamous cells of undetermined significance) on Pap smear 05/06/05   NEG HIGH RISK HPV--C&B BIOPSY BENIGN 12/2005  . Asthma   . Bipolar 2 disorder ([place])   . Cancer ([place])    skin cancer - basal cell  . Colon polyps   . Complication of anesthesia    anxious afterwards, will get headaches   . Constipation   . Depression   . Fibromyalgia 10/2013  . GERD (gastroesophageal reflux disease)   . 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 Heart murmur    never had any problems  . Hemorrhoids   . High cholesterol   . 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   cytology negative  . IBS (irritable bowel syndrome)   . Insomnia   . Lymphocytic colitis   . 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 Migraines   . Osteoarthritis   . Osteopenia   . Peripheral neuropathy   . PTSD (post-traumatic stress disorder)     Past Surgical History:  Procedure Laterality Date  . BONE MARROW BIOPSY Left 12/18/2013   Plasma cell dyscrasia 8% population of plasma cells  . BREAST BIOPSY Right    benign stereo  . CESAREAN SECTION  [phone]  .  CHOLECYSTECTOMY N/A 10/16/2012   Procedure: LAPAROSCOPIC CHOLECYSTECTOMY;  Surgeon: Joyice Faster. Cornett, MD;  [facility];  Service: General;  Laterality: N/A;  . COLONOSCOPY     numerous times  . DILATION AND CURETTAGE OF UTERUS    . ESOPHAGOGASTRODUODENOSCOPY    . [place]   x3  . IUD REMOVAL  02/2015   Mirena  . LAPAROSCOPIC LYSIS OF ADHESIONS N/A 10/16/2012   Procedure: LAPAROSCOPIC LYSIS OF ADHESIONS;  Surgeon: Joyice Faster. Cornett, MD;  [facility];  Service: General;  Laterality: N/A;  . LAPAROSCOPY N/A 10/16/2012   Procedure: LAPAROSCOPY DIAGNOSTIC;  Surgeon: Joyice Faster. Cornett, MD;  [facility];  Service: General;  Laterality: N/A;  . PELVIC LAPAROSCOPY    . RADIOLOGY WITH ANESTHESIA N/A 12/16/2015   Procedure: MRI OF BRAIN WITH AND WITHOUT CONTRAST;  Surgeon: Medication Radiologist, MD;  Location: [place];  Service: Radiology;  Laterality: N/A;  . SHOULDER SURGERY  2007/2008  . SPINE SURGERY  2010   cervical    There were no vitals filed for this visit.      Subjective Assessment - 12/13/16 1239    Subjective Having pain down right side of neck.  It was better when it was warmer.  The heat seems to help  Pertinent History fibromyalgia, chronic pain   Limitations House hold activities   Patient Stated Goals get rid of the pain   Currently in Pain? Yes   Pain Score 6    Pain Location Neck   Pain Orientation Right   Pain Descriptors / Indicators Constant;Sore   Pain Type Chronic pain   Pain Radiating Towards up into head and down around right shoulder blade   Pain Onset 1 to 4 weeks ago   Aggravating Factors  not sure   Pain Relieving Factors TENS and heat   Multiple Pain Sites No                         [facility] Adult PT Treatment/Exercise - 12/13/16 0001      Self-Care   Self-Care Other Self-Care Comments   Other Self-Care Comments  education on rolling ball under feet     Manual Therapy   Manual Therapy Myofascial release    Myofascial Release right and left periscapular region and cervical paraspinals, suboccipitals                  PT Short Term Goals - 12/06/16 1342      PT SHORT TERM GOAL #1   Title be independent in initial HEP   Time 4   Period Weeks   Status New   Target Date 01/03/17     PT SHORT TERM GOAL #2   Title increased shoulder flex and abduction 4/5 bilaterally without increased pain for improved overhead reaching   Time 4   Period Weeks   Status New   Target Date 01/03/17     PT SHORT TERM GOAL #3   Title 50% reduction in muscle spasm for improved ROM during functional activities   Time 4   Period Weeks   Status New   Target Date 01/03/17           PT Long Term Goals - 12/06/16 1344      PT LONG TERM GOAL #1   Title be independent in advanced HEP   Baseline ...   Time 8   Period Weeks   Status New   Target Date 01/31/17     PT LONG TERM GOAL #2   Title able to drive using right UE due to improved strength    Baseline ...   Time 8   Period Weeks   Status New   Target Date 01/31/17     PT LONG TERM GOAL #3   Title report 75% less neck pain throughout the day with normal functional activities   Time 8   Period Weeks   Status New   Target Date 01/31/17     PT LONG TERM GOAL #4   Title able to make the bed without increased pain or muscle spasms   Time 8   Period Weeks   Status New   Target Date 01/31/17     PT LONG TERM GOAL #5   Title .Marland KitchenMarland Kitchen               Plan - 12/13/16 1353    Clinical Impression Statement Patient had fascial release with manual techniques.  She had reduced pain after treatment today. Pt has fascial adhesions throught right side from periscapular up to subocciptal regions. Pt continues to need MFR and working into muscle spasms as tolerated in order to reduce cramping and improve ROM for functional tasks   Rehab Potential Excellent   PT  Treatment/Interventions ADLs/Self Care Home  Management;Biofeedback;Cryotherapy;Electrical Stimulation;Iontophoresis 55m/ml Dexamethasone;Moist Heat;Traction;Ultrasound;Gait training;Functional mobility training;Therapeutic activities;Therapeutic exercise;Neuromuscular re-education;Patient/family education;Manual techniques;Passive range of motion;Dry needling;Taping   PT Next Visit Plan STM, cervical ROM and stretches, cervical traction   Consulted and Agree with Plan of Care Patient      Patient will benefit from skilled therapeutic intervention in order to improve the following deficits and impairments:  Postural dysfunction, Increased muscle spasms, Pain, Decreased strength  Visit Diagnosis: Cervicalgia  Other muscle spasm  Muscle weakness (generalized)     Problem List Patient Active Problem List   Diagnosis Date Noted  . DDD (degenerative disc disease), cervical 07/14/2016  . Primary osteoarthritis of both feet 07/14/2016  . Primary osteoarthritis of both hands 07/14/2016  . Other fatigue 07/14/2016  . History of IBS 07/14/2016  . Osteopenia of multiple sites 07/14/2016  . Fibromyalgia 01/13/2016  . MGUS (monoclonal gammopathy of unknown significance) 12/23/2013  . Chronic cholecystitis without calculus 10/18/2012  . Abdominal pain, unspecified site 10/18/2012  . Nausea alone 10/18/2012  . Unspecified constipation 10/18/2012  . Depression 10/18/2012  . [place] 10/18/2012  . Anxiety   . ASCUS (atypical squamous cells of undetermined significance) on Pap smear   . IUD   . Hemorrhoids 11/29/2010  . Abdominal pain, left upper quadrant 11/29/2010    JZannie Cove PT 12/13/2016, 1:59 PM  [facility] [address] [place] [address]: [phone]  Fax:  [phone] Name: Danielle MACHNIKMRN: [id number] of Birth: [birth year]

## 2016-12-14 DIAGNOSIS — J3081 Allergic rhinitis due to animal (cat) (dog) hair and dander: Secondary | ICD-10-CM | POA: Diagnosis not present

## 2016-12-15 DIAGNOSIS — J301 Allergic rhinitis due to pollen: Secondary | ICD-10-CM | POA: Diagnosis not present

## 2016-12-15 DIAGNOSIS — J3089 Other allergic rhinitis: Secondary | ICD-10-CM | POA: Diagnosis not present

## 2016-12-15 DIAGNOSIS — J3081 Allergic rhinitis due to animal (cat) (dog) hair and dander: Secondary | ICD-10-CM | POA: Diagnosis not present

## 2016-12-18 DIAGNOSIS — D692 Other nonthrombocytopenic purpura: Secondary | ICD-10-CM | POA: Diagnosis not present

## 2016-12-18 DIAGNOSIS — L718 Other rosacea: Secondary | ICD-10-CM | POA: Diagnosis not present

## 2016-12-18 DIAGNOSIS — Z85828 Personal history of other malignant neoplasm of skin: Secondary | ICD-10-CM | POA: Diagnosis not present

## 2016-12-19 ENCOUNTER — Encounter: Payer: Medicare Other | Admitting: Physical Therapy

## 2016-12-19 DIAGNOSIS — F3113 Bipolar disorder, current episode manic without psychotic features, severe: Secondary | ICD-10-CM | POA: Diagnosis not present

## 2016-12-21 ENCOUNTER — Ambulatory Visit: Payer: Medicare Other | Admitting: Physical Therapy

## 2016-12-21 DIAGNOSIS — J3089 Other allergic rhinitis: Secondary | ICD-10-CM | POA: Diagnosis not present

## 2016-12-21 DIAGNOSIS — M542 Cervicalgia: Secondary | ICD-10-CM | POA: Diagnosis not present

## 2016-12-21 DIAGNOSIS — M62838 Other muscle spasm: Secondary | ICD-10-CM

## 2016-12-21 DIAGNOSIS — M6281 Muscle weakness (generalized): Secondary | ICD-10-CM | POA: Diagnosis not present

## 2016-12-21 DIAGNOSIS — J301 Allergic rhinitis due to pollen: Secondary | ICD-10-CM | POA: Diagnosis not present

## 2016-12-21 NOTE — Therapy (Signed)
[facility] [address], [place], [place], [address] Phone: [phone]   Fax:  [phone]  Physical Therapy Treatment  Patient Details  Name: Danielle Bruce MRN: [id number] Date of Birth: 1961/02/27 Referring Provider: Mayra Neer  Encounter Date: 12/21/2016      PT End of Session - 12/21/16 1409    Visit Number 3   Number of Visits 10   Date for PT Re-Evaluation 01/31/17   Authorization Type gcodes at 10th visit   PT Start Time 1402   PT Stop Time 1445   PT Time Calculation (min) 43 min   Activity Tolerance Patient tolerated treatment well   Behavior During Therapy [facility] for tasks assessed/performed      Past Medical History:  Diagnosis Date  . Anemia   . Anxiety   . Arthritis    osteoarthritis  . ASCUS (atypical squamous cells of undetermined significance) on Pap smear 05/06/05   NEG HIGH RISK HPV--C&B BIOPSY BENIGN 12/2005  . Asthma   . Bipolar 2 disorder ([place])   . Cancer ([place])    skin cancer - basal cell  . Colon polyps   . Complication of anesthesia    anxious afterwards, will get headaches   . Constipation   . Depression   . Fibromyalgia 10/2013  . GERD (gastroesophageal reflux disease)   . 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 Heart murmur    never had any problems  . Hemorrhoids   . High cholesterol   . 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   cytology negative  . IBS (irritable bowel syndrome)   . Insomnia   . Lymphocytic colitis   . 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 Migraines   . Osteoarthritis   . Osteopenia   . Peripheral neuropathy   . PTSD (post-traumatic stress disorder)     Past Surgical History:  Procedure Laterality Date  . BONE MARROW BIOPSY Left 12/18/2013   Plasma cell dyscrasia 8% population of plasma cells  . BREAST BIOPSY Right    benign stereo  . CESAREAN SECTION  [phone]  .  CHOLECYSTECTOMY N/A 10/16/2012   Procedure: LAPAROSCOPIC CHOLECYSTECTOMY;  Surgeon: Joyice Faster. Cornett, MD;  Location: [facility];  Service: General;  Laterality: N/A;  . COLONOSCOPY     numerous times  . DILATION AND CURETTAGE OF UTERUS    . ESOPHAGOGASTRODUODENOSCOPY    . [place]   x3  . IUD REMOVAL  02/2015   Mirena  . LAPAROSCOPIC LYSIS OF ADHESIONS N/A 10/16/2012   Procedure: LAPAROSCOPIC LYSIS OF ADHESIONS;  Surgeon: Joyice Faster. Cornett, MD;  Location: [facility];  Service: General;  Laterality: N/A;  . LAPAROSCOPY N/A 10/16/2012   Procedure: LAPAROSCOPY DIAGNOSTIC;  Surgeon: Joyice Faster. Cornett, MD;  Location: [facility];  Service: General;  Laterality: N/A;  . PELVIC LAPAROSCOPY    . RADIOLOGY WITH ANESTHESIA N/A 12/16/2015   Procedure: MRI OF BRAIN WITH AND WITHOUT CONTRAST;  Surgeon: Medication Radiologist, MD;  Location: [facility];  Service: Radiology;  Laterality: N/A;  . SHOULDER SURGERY  2007/2008  . SPINE SURGERY  2010   cervical    There were no vitals filed for this visit.      Subjective Assessment - 12/21/16 1404    Subjective I have a lot of pain in my shoulder blade on right side   Pertinent History fibromyalgia, chronic pain  Patient Stated Goals get rid of the pain   Pain Score 6    Pain Orientation Right   Pain Descriptors / Indicators Constant;Sore   Pain Type Chronic pain   Pain Onset 1 to 4 weeks ago   Pain Frequency Constant   Aggravating Factors  being very active and hurts more at night   Pain Relieving Factors TENS and heat   Effect of Pain on Daily Activities resting    Multiple Pain Sites No                         [facility] Adult PT Treatment/Exercise - 12/21/16 0001      Neck Exercises: Seated   Other Seated Exercise cervical rotation and side bending - 20x each side     Manual Therapy   Myofascial Release right and left periscapular region and cervical paraspinals, suboccipitals                PT Education -  12/21/16 1447    Education provided Yes   Education Details cervical rotation and side bending, ball roll under foot   Person(s) Educated Patient   Methods Explanation;Demonstration;Verbal cues;Handout   Comprehension Verbalized understanding;Returned demonstration          PT Short Term Goals - 12/21/16 1456      PT SHORT TERM GOAL #1   Title be independent in initial HEP   Time 4   Period Weeks   Status On-going     PT SHORT TERM GOAL #2   Title increased shoulder flex and abduction 4/5 bilaterally without increased pain for improved overhead reaching   Time 4   Period Weeks   Status On-going     PT SHORT TERM GOAL #3   Title 50% reduction in muscle spasm for improved ROM during functional activities   Time 4   Period Weeks   Status On-going           PT Long Term Goals - 12/06/16 1344      PT LONG TERM GOAL #1   Title be independent in advanced HEP   Baseline ...   Time 8   Period Weeks   Status New   Target Date 01/31/17     PT LONG TERM GOAL #2   Title able to drive using right UE due to improved strength    Baseline ...   Time 8   Period Weeks   Status New   Target Date 01/31/17     PT LONG TERM GOAL #3   Title report 75% less neck pain throughout the day with normal functional activities   Time 8   Period Weeks   Status New   Target Date 01/31/17     PT LONG TERM GOAL #4   Title able to make the bed without increased pain or muscle spasms   Time 8   Period Weeks   Status New   Target Date 01/31/17     PT LONG TERM GOAL #5   Title .Marland KitchenMarland Kitchen               Plan - 12/21/16 1448    Clinical Impression Statement Patient had fascial release with manual techniques and felt more ROM and some "pops" that felt good when doing cervical side bending after manual treatment.  Pt has fascial tension down posterior chain and was educated in performing fascial release on foot in order to release soft tissue.  She will benefit  from skilled PT for  improved mobility and reduced muscle spasms for return to functoinal activities.   Rehab Potential Excellent   PT Treatment/Interventions ADLs/Self Care Home Management;Biofeedback;Cryotherapy;Electrical Stimulation;Iontophoresis 44m/ml Dexamethasone;Moist Heat;Traction;Ultrasound;Gait training;Functional mobility training;Therapeutic activities;Therapeutic exercise;Neuromuscular re-education;Patient/family education;Manual techniques;Passive range of motion;Dry needling;Taping   PT Next Visit Plan myofascial release, cervical ROM and stretches, cervical strength, rotation with towel, scapular stabilty   Consulted and Agree with Plan of Care Patient      Patient will benefit from skilled therapeutic intervention in order to improve the following deficits and impairments:  Postural dysfunction, Increased muscle spasms, Pain, Decreased strength  Visit Diagnosis: Cervicalgia  Other muscle spasm  Muscle weakness (generalized)     Problem List Patient Active Problem List   Diagnosis Date Noted  . DDD (degenerative disc disease), cervical 07/14/2016  . Primary osteoarthritis of both feet 07/14/2016  . Primary osteoarthritis of both hands 07/14/2016  . Other fatigue 07/14/2016  . History of IBS 07/14/2016  . Osteopenia of multiple sites 07/14/2016  . Fibromyalgia 01/13/2016  . MGUS (monoclonal gammopathy of unknown significance) 12/23/2013  . Chronic cholecystitis without calculus 10/18/2012  . Abdominal pain, unspecified site 10/18/2012  . Nausea alone 10/18/2012  . Unspecified constipation 10/18/2012  . Depression 10/18/2012  . Bipolar 2 disorder ([place] 10/18/2012  . Anxiety   . ASCUS (atypical squamous cells of undetermined significance) on Pap smear   . IUD   . Hemorrhoids 11/29/2010  . Abdominal pain, left upper quadrant 11/29/2010    JZannie Cove PT 12/21/2016, 2:56 PM  [facility] [address] [place] [address]Phone: [phone]  Fax:  [phone] Name: Danielle SCHWARZMRN: [id number] of Birth: [birth year]

## 2016-12-21 NOTE — Patient Instructions (Addendum)
   BALL STM  - ARCH OF FOOT  While seated, place a small ball under your foot and press into it while rolling it around.   Use this form of self-soft tissue massage technique for the arch of the foot. Roll at least 5 minutes each side    CERVICAL ROTATION  Turn your head towards the side, then return back to looking straight ahead. Hold 5 sec repeat 10x each side    CERVICAL SIDE BEND  Tilt your head towards the side, then return back to looking straight ahead.  (Be sure to keep you eyes and nose pointed straight ahead the entire time). Hold 5 sec repeat 10x each side

## 2016-12-25 ENCOUNTER — Ambulatory Visit: Payer: Medicare Other | Admitting: Physical Therapy

## 2016-12-25 DIAGNOSIS — M6281 Muscle weakness (generalized): Secondary | ICD-10-CM | POA: Diagnosis not present

## 2016-12-25 DIAGNOSIS — M62838 Other muscle spasm: Secondary | ICD-10-CM | POA: Diagnosis not present

## 2016-12-25 DIAGNOSIS — J3089 Other allergic rhinitis: Secondary | ICD-10-CM | POA: Diagnosis not present

## 2016-12-25 DIAGNOSIS — M542 Cervicalgia: Secondary | ICD-10-CM | POA: Diagnosis not present

## 2016-12-25 DIAGNOSIS — J301 Allergic rhinitis due to pollen: Secondary | ICD-10-CM | POA: Diagnosis not present

## 2016-12-25 DIAGNOSIS — J3081 Allergic rhinitis due to animal (cat) (dog) hair and dander: Secondary | ICD-10-CM | POA: Diagnosis not present

## 2016-12-25 NOTE — Therapy (Signed)
[facility] [address] [place], [place], [address] Phone: [phone]   Fax:  [phone]  Physical Therapy Treatment  Patient Details  Name: Danielle Bruce MRN: [id number] Date of Birth: 03-Feb-1961 Referring Provider: Mayra Neer  Encounter Date: 12/25/2016      PT End of Session - 12/25/16 1416    Visit Number 4   Number of Visits 10   Date for PT Re-Evaluation 01/31/17   Authorization Type gcodes at 10th visit   PT Start Time 1407  arrived late   PT Stop Time 1452   PT Time Calculation (min) 45 min   Activity Tolerance Patient tolerated treatment well   Behavior During Therapy [facility] for tasks assessed/performed      Past Medical History:  Diagnosis Date  . Anemia   . Anxiety   . Arthritis    osteoarthritis  . ASCUS (atypical squamous cells of undetermined significance) on Pap smear 05/06/05   NEG HIGH RISK HPV--C&B BIOPSY BENIGN 12/2005  . Asthma   . Bipolar 2 disorder ([place])   . Cancer ([place])    skin cancer - basal cell  . Colon polyps   . Complication of anesthesia    anxious afterwards, will get headaches   . Constipation   . Depression   . Fibromyalgia 10/2013  . GERD (gastroesophageal reflux disease)   . 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 Heart murmur    never had any problems  . Hemorrhoids   . High cholesterol   . 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   cytology negative  . IBS (irritable bowel syndrome)   . Insomnia   . Lymphocytic colitis   . 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 Migraines   . Osteoarthritis   . Osteopenia   . Peripheral neuropathy   . PTSD (post-traumatic stress disorder)     Past Surgical History:  Procedure Laterality Date  . BONE MARROW BIOPSY Left 12/18/2013   Plasma cell dyscrasia 8% population of plasma cells  . BREAST BIOPSY Right    benign stereo  . CESAREAN SECTION  [phone]   . CHOLECYSTECTOMY N/A 10/16/2012   Procedure: LAPAROSCOPIC CHOLECYSTECTOMY;  Surgeon: Joyice Faster. Cornett, MD;  [facility];  Service: General;  Laterality: N/A;  . COLONOSCOPY     numerous times  . DILATION AND CURETTAGE OF UTERUS    . ESOPHAGOGASTRODUODENOSCOPY    . [place]   x3  . IUD REMOVAL  02/2015   Mirena  . LAPAROSCOPIC LYSIS OF ADHESIONS N/A 10/16/2012   Procedure: LAPAROSCOPIC LYSIS OF ADHESIONS;  Surgeon: Joyice Faster. Cornett, MD;  [facility];  Service: General;  Laterality: N/A;  . LAPAROSCOPY N/A 10/16/2012   Procedure: LAPAROSCOPY DIAGNOSTIC;  Surgeon: Joyice Faster. Cornett, MD;  [facility];  Service: General;  Laterality: N/A;  . PELVIC LAPAROSCOPY    . RADIOLOGY WITH ANESTHESIA N/A 12/16/2015   Procedure: MRI OF BRAIN WITH AND WITHOUT CONTRAST;  Surgeon: Medication Radiologist, MD;  Location: [facility];  Service: Radiology;  Laterality: N/A;  . SHOULDER SURGERY  2007/2008  . SPINE SURGERY  2010   cervical    There were no vitals filed for this visit.      Subjective Assessment - 12/25/16 1410    Subjective I have been having more pain and it is worse on the left today.  It seems like if  feels good for a couple of days.  It feels like the pain is staying about the same overall.   Pertinent History fibromyalgia, chronic pain   Limitations House hold activities   Patient Stated Goals get rid of the pain   Currently in Pain? Yes   Pain Score 7    Pain Location Neck   Pain Orientation Right   Pain Descriptors / Indicators Constant;Sore   Pain Type Chronic pain   Pain Onset 1 to 4 weeks ago   Pain Frequency Constant   Aggravating Factors  Saturday I went to an art show and did a lot of walking   Pain Relieving Factors TENS and heat   Effect of Pain on Daily Activities normal activities   Multiple Pain Sites No                         [facility] Adult PT Treatment/Exercise - 12/25/16 0001      Neck Exercises: Supine   Neck  Retraction 10 reps   Shoulder ABduction Both;20 reps   Other Supine Exercise thoracic extension with towel under back   Other Supine Exercise supine towel along spine, horizontal abduction,      Manual Therapy   Manual Therapy Joint mobilization;Myofascial release   Joint Mobilization AP to T4-5; A/P to rib 4/5   Myofascial Release thoracic paraspinals                  PT Short Term Goals - 12/21/16 1456      PT SHORT TERM GOAL #1   Title be independent in initial HEP   Time 4   Period Weeks   Status On-going     PT SHORT TERM GOAL #2   Title increased shoulder flex and abduction 4/5 bilaterally without increased pain for improved overhead reaching   Time 4   Period Weeks   Status On-going     PT SHORT TERM GOAL #3   Title 50% reduction in muscle spasm for improved ROM during functional activities   Time 4   Period Weeks   Status On-going           PT Long Term Goals - 12/06/16 1344      PT LONG TERM GOAL #1   Title be independent in advanced HEP   Baseline ...   Time 8   Period Weeks   Status New   Target Date 01/31/17     PT LONG TERM GOAL #2   Title able to drive using right UE due to improved strength    Baseline ...   Time 8   Period Weeks   Status New   Target Date 01/31/17     PT LONG TERM GOAL #3   Title report 75% less neck pain throughout the day with normal functional activities   Time 8   Period Weeks   Status New   Target Date 01/31/17     PT LONG TERM GOAL #4   Title able to make the bed without increased pain or muscle spasms   Time 8   Period Weeks   Status New   Target Date 01/31/17     PT LONG TERM GOAL #5   Title .Marland KitchenMarland Kitchen               Plan - 12/25/16 1504    Clinical Impression Statement Patient was able to perform exercises correctly with verbal cues to keep shoulders depressed and tactile cues  for cervical retractions.  Patient had no increased pain during exercises today.  She has muscle spasms throughout  her thoracic multifidi and responded well to myofacial release, but could not tolerate a lot of pressure.  Pt continues to need skilled PT for improved function and soft tissue health.   Rehab Potential Excellent   PT Treatment/Interventions ADLs/Self Care Home Management;Biofeedback;Cryotherapy;Electrical Stimulation;Iontophoresis 52m/ml Dexamethasone;Moist Heat;Traction;Ultrasound;Gait training;Functional mobility training;Therapeutic activities;Therapeutic exercise;Neuromuscular re-education;Patient/family education;Manual techniques;Passive range of motion;Dry needling;Taping   PT Next Visit Plan myofascial release, cervical thoracic, ROM and stretches, cervical strength, rotation with towel, scapular stabilty   Consulted and Agree with Plan of Care Patient      Patient will benefit from skilled therapeutic intervention in order to improve the following deficits and impairments:  Postural dysfunction, Increased muscle spasms, Pain, Decreased strength  Visit Diagnosis: Cervicalgia  Other muscle spasm  Muscle weakness (generalized)     Problem List Patient Active Problem List   Diagnosis Date Noted  . DDD (degenerative disc disease), cervical 07/14/2016  . Primary osteoarthritis of both feet 07/14/2016  . Primary osteoarthritis of both hands 07/14/2016  . Other fatigue 07/14/2016  . History of IBS 07/14/2016  . Osteopenia of multiple sites 07/14/2016  . Fibromyalgia 01/13/2016  . MGUS (monoclonal gammopathy of unknown significance) 12/23/2013  . Chronic cholecystitis without calculus 10/18/2012  . Abdominal pain, unspecified site 10/18/2012  . Nausea alone 10/18/2012  . Unspecified constipation 10/18/2012  . Depression 10/18/2012  . [place] 2 [place] ([place] 10/18/2012  . Anxiety   . ASCUS (atypical squamous cells of undetermined significance) on Pap smear   . IUD   . Hemorrhoids 11/29/2010  . Abdominal pain, left upper quadrant 11/29/2010    JZannie Cove  PT 12/25/2016, 4:25 PM  [facility] [address] [place] [address]Phone: [phone]  Fax:  [phone] Name: CFARRAN AMSDENMRN: [id number] of Birth: [birth year]

## 2016-12-26 DIAGNOSIS — F3113 Bipolar disorder, current episode manic without psychotic features, severe: Secondary | ICD-10-CM | POA: Diagnosis not present

## 2016-12-26 DIAGNOSIS — G43909 Migraine, unspecified, not intractable, without status migrainosus: Secondary | ICD-10-CM | POA: Diagnosis not present

## 2016-12-27 ENCOUNTER — Encounter: Payer: Self-pay | Admitting: Physical Therapy

## 2016-12-27 ENCOUNTER — Ambulatory Visit: Payer: Medicare Other | Admitting: Physical Therapy

## 2016-12-27 DIAGNOSIS — M542 Cervicalgia: Secondary | ICD-10-CM | POA: Diagnosis not present

## 2016-12-27 DIAGNOSIS — M62838 Other muscle spasm: Secondary | ICD-10-CM | POA: Diagnosis not present

## 2016-12-27 DIAGNOSIS — M6281 Muscle weakness (generalized): Secondary | ICD-10-CM

## 2016-12-27 NOTE — Therapy (Signed)
[facility] [address], [place], [place], [address] Phone: [phone]   Fax:  [phone]  Physical Therapy Treatment  Patient Details  Name: Danielle Bruce MRN: [id number] Date of Birth: [birth year] Referring Provider: Mayra Neer  Encounter Date: 12/27/2016      PT End of Session - 12/27/16 1407    Visit Number 5   Number of Visits 10   Date for PT Re-Evaluation 01/31/17   Authorization Type gcodes at 10th visit   PT Start Time 1407   PT Stop Time 1445   PT Time Calculation (min) 38 min   Activity Tolerance Patient tolerated treatment well   Behavior During Therapy [facility] for tasks assessed/performed      Past Medical History:  Diagnosis Date  . Anemia   . Anxiety   . Arthritis    osteoarthritis  . ASCUS (atypical squamous cells of undetermined significance) on Pap smear 05/06/05   NEG HIGH RISK HPV--C&B BIOPSY BENIGN 12/2005  . Asthma   . Bipolar 2 disorder ([place])   . Cancer ([place])    skin cancer - basal cell  . Colon polyps   . Complication of anesthesia    anxious afterwards, will get headaches   . Constipation   . Depression   . Fibromyalgia 10/2013  . GERD (gastroesophageal reflux disease)   . 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 Heart murmur    never had any problems  . Hemorrhoids   . High cholesterol   . 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   cytology negative  . IBS (irritable bowel syndrome)   . Insomnia   . Lymphocytic colitis   . 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 Migraines   . Osteoarthritis   . Osteopenia   . Peripheral neuropathy   . PTSD (post-traumatic stress disorder)     Past Surgical History:  Procedure Laterality Date  . BONE MARROW BIOPSY Left 12/18/2013   Plasma cell dyscrasia 8% population of plasma cells  . BREAST BIOPSY Right    benign stereo  . CESAREAN SECTION  [phone]  .  CHOLECYSTECTOMY N/A 10/16/2012   Procedure: LAPAROSCOPIC CHOLECYSTECTOMY;  Surgeon: Joyice Faster. Cornett, MD;  Location: [facility];  Service: General;  Laterality: N/A;  . COLONOSCOPY     numerous times  . DILATION AND CURETTAGE OF UTERUS    . ESOPHAGOGASTRODUODENOSCOPY    . [place]   x3  . IUD REMOVAL  02/2015   Mirena  . LAPAROSCOPIC LYSIS OF ADHESIONS N/A 10/16/2012   Procedure: LAPAROSCOPIC LYSIS OF ADHESIONS;  Surgeon: Joyice Faster. Cornett, MD;  Location: [facility];  Service: General;  Laterality: N/A;  . LAPAROSCOPY N/A 10/16/2012   Procedure: LAPAROSCOPY DIAGNOSTIC;  Surgeon: Joyice Faster. Cornett, MD;  Location: [facility];  Service: General;  Laterality: N/A;  . PELVIC LAPAROSCOPY    . RADIOLOGY WITH ANESTHESIA N/A 12/16/2015   Procedure: MRI OF BRAIN WITH AND WITHOUT CONTRAST;  Surgeon: Medication Radiologist, MD;  Location: [facility];  Service: Radiology;  Laterality: N/A;  . SHOULDER SURGERY  2007/2008  . SPINE SURGERY  2010   cervical    There were no vitals filed for this visit.      Subjective Assessment - 12/27/16 1409    Subjective I have felt better mentally and able to get out and do more.  Yesterday was not good, but today is better.  I had the second shot yesterday.     Pertinent History fibromyalgia, chronic pain   Limitations House hold activities   Patient Stated Goals get rid of the pain   Currently in Pain? Yes   Pain Score 2   not moving   Pain Location Neck   Pain Orientation Right   Pain Descriptors / Indicators Sore;Constant   Pain Type Chronic pain   Pain Radiating Towards down spine to the right   Pain Onset 1 to 4 weeks ago   Pain Frequency Constant   Aggravating Factors  not sure   Pain Relieving Factors TENS and heat                         [facility] Adult PT Treatment/Exercise - 12/27/16 0001      Neck Exercises: Standing   Other Standing Exercises row with yellow band - 20x  cues to elongate neck and keep shoulder down      Lumbar Exercises: Standing   Other Standing Lumbar Exercises MELT technique to lumbar for fascial release of posterior chain     Shoulder Exercises: Pulleys   Flexion 3 minutes     Manual Therapy   Myofascial Release cervical parapsinals and suboccipitals                PT Education - 12/27/16 1449    Education provided Yes   Education Details educated on FA due to patient feeling better when not eating sugar but is having difficulty abstaining from it   Person(s) Educated Patient   Methods Explanation;Handout   Comprehension Verbalized understanding          PT Short Term Goals - 12/27/16 1419      PT SHORT TERM GOAL #1   Title be independent in initial HEP   Baseline doing the ball rolling on the feet   Time 4   Period Weeks   Status Achieved     PT SHORT TERM GOAL #2   Title increased shoulder flex and abduction 4/5 bilaterally without increased pain for improved overhead reaching   Time 4   Period Weeks   Status On-going     PT SHORT TERM GOAL #3   Title 50% reduction in muscle spasm for improved ROM during functional activities   Time 4   Period Weeks   Status On-going           PT Long Term Goals - 12/06/16 1344      PT LONG TERM GOAL #1   Title be independent in advanced HEP   Baseline ...   Time 8   Period Weeks   Status New   Target Date 01/31/17     PT LONG TERM GOAL #2   Title able to drive using right UE due to improved strength    Baseline ...   Time 8   Period Weeks   Status New   Target Date 01/31/17     PT LONG TERM GOAL #3   Title report 75% less neck pain throughout the day with normal functional activities   Time 8   Period Weeks   Status New   Target Date 01/31/17     PT LONG TERM GOAL #4   Title able to make the bed without increased pain or muscle spasms   Time 8   Period Weeks   Status New   Target Date 01/31/17     PT LONG TERM GOAL #5  Title ..Marland Kitchen               Plan - 12/27/16 1510    Clinical  Impression Statement Patient was able to tolerate more exercises today.  She had difficutly keeping upper traps relaxed with when doing rows and needed a lot of cues.  No increased pain throughout treatment and responded well to myofascial release of posterior chain.  Release with foam roll to thoracolumbar fascia.  Pt continues to need skilled PT for pain managment and to owrk on scapula and cervical strength.   Rehab Potential Excellent   PT Treatment/Interventions ADLs/Self Care Home Management;Biofeedback;Cryotherapy;Electrical Stimulation;Iontophoresis 68m/ml Dexamethasone;Moist Heat;Traction;Ultrasound;Gait training;Functional mobility training;Therapeutic activities;Therapeutic exercise;Neuromuscular re-education;Patient/family education;Manual techniques;Passive range of motion;Dry needling;Taping   PT Next Visit Plan KX has had therapy previously this year, myofascial release, cervical thoracic, ROM and stretches, cervical strength, rotation with towel, scapular stabilty   Consulted and Agree with Plan of Care Patient      Patient will benefit from skilled therapeutic intervention in order to improve the following deficits and impairments:  Postural dysfunction, Increased muscle spasms, Pain, Decreased strength  Visit Diagnosis: Cervicalgia  Other muscle spasm  Muscle weakness (generalized)     Problem List Patient Active Problem List   Diagnosis Date Noted  . DDD (degenerative disc disease), cervical 07/14/2016  . Primary osteoarthritis of both feet 07/14/2016  . Primary osteoarthritis of both hands 07/14/2016  . Other fatigue 07/14/2016  . History of IBS 07/14/2016  . Osteopenia of multiple sites 07/14/2016  . Fibromyalgia 01/13/2016  . MGUS (monoclonal gammopathy of unknown significance) 12/23/2013  . Chronic cholecystitis without calculus 10/18/2012  . Abdominal pain, unspecified site 10/18/2012  . Nausea alone 10/18/2012  . Unspecified constipation 10/18/2012  .  Depression 10/18/2012  . [place] 10/18/2012  . Anxiety   . ASCUS (atypical squamous cells of undetermined significance) on Pap smear   . IUD   . Hemorrhoids 11/29/2010  . Abdominal pain, left upper quadrant 11/29/2010    JZannie Cove PT 12/27/2016, 3:33 PM  [facility] [address] [place] [address]Phone: [phone]  Fax:  [phone] Name: Danielle SACRAMRN: [id number] of Birth: [birth year]

## 2016-12-27 NOTE — Patient Instructions (Signed)
[email or web address]  [facility] Thursday 7pm  [address] Room [place], [place] [address] [place] Parking behind church, enter on [address]  [name] [address], [place], [place] [address] Phone # [phone] Fax [phone]

## 2017-01-01 ENCOUNTER — Ambulatory Visit: Payer: Medicare Other | Attending: Family Medicine | Admitting: Physical Therapy

## 2017-01-01 ENCOUNTER — Encounter: Payer: Self-pay | Admitting: Physical Therapy

## 2017-01-01 DIAGNOSIS — J301 Allergic rhinitis due to pollen: Secondary | ICD-10-CM | POA: Diagnosis not present

## 2017-01-01 DIAGNOSIS — M6281 Muscle weakness (generalized): Secondary | ICD-10-CM | POA: Diagnosis not present

## 2017-01-01 DIAGNOSIS — J3081 Allergic rhinitis due to animal (cat) (dog) hair and dander: Secondary | ICD-10-CM | POA: Diagnosis not present

## 2017-01-01 DIAGNOSIS — M62838 Other muscle spasm: Secondary | ICD-10-CM | POA: Diagnosis not present

## 2017-01-01 DIAGNOSIS — F3113 Bipolar disorder, current episode manic without psychotic features, severe: Secondary | ICD-10-CM | POA: Diagnosis not present

## 2017-01-01 DIAGNOSIS — M542 Cervicalgia: Secondary | ICD-10-CM

## 2017-01-01 DIAGNOSIS — J3089 Other allergic rhinitis: Secondary | ICD-10-CM | POA: Diagnosis not present

## 2017-01-01 NOTE — Therapy (Signed)
[facility] [name] [place], [place], [address] Phone: [phone]   Fax:  [phone]  Physical Therapy Treatment  Patient Details  Name: Danielle Bruce MRN: [id number] Date of Birth: Aug 21, 1960 Referring Provider: Mayra Neer   Encounter Date: 01/01/2017  PT End of Session - 01/01/17 1319    Visit Number  6    Number of Visits  10    Date for PT Re-Evaluation  01/31/17    Authorization Type  gcodes at 10th visit    PT Start Time  1230    PT Stop Time  1311    PT Time Calculation (min)  41 min    Activity Tolerance  Patient tolerated treatment well    Behavior During Therapy  [facility] for tasks assessed/performed       Past Medical History:  Diagnosis Date  . Anemia   . Anxiety   . Arthritis    osteoarthritis  . ASCUS (atypical squamous cells of undetermined significance) on Pap smear 05/06/05   NEG HIGH RISK HPV--C&B BIOPSY BENIGN 12/2005  . Asthma   . Bipolar 2 disorder ([place])   . Cancer ([place])    skin cancer - basal cell  . Colon polyps   . Complication of anesthesia    anxious afterwards, will get headaches   . Constipation   . Depression   . Fibromyalgia 10/2013  . GERD (gastroesophageal reflux disease)   . 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 Heart murmur    never had any problems  . Hemorrhoids   . High cholesterol   . 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   cytology negative  . IBS (irritable bowel syndrome)   . Insomnia   . Lymphocytic colitis   . 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 Migraines   . Osteoarthritis   . Osteopenia   . Peripheral neuropathy   . PTSD (post-traumatic stress disorder)     Past Surgical History:  Procedure Laterality Date  . BONE MARROW BIOPSY Left 12/18/2013   Plasma cell dyscrasia 8% population of plasma cells  . BREAST BIOPSY Right    benign stereo  . CESAREAN SECTION  [phone]   . COLONOSCOPY     numerous times  . DILATION AND CURETTAGE OF UTERUS    . ESOPHAGOGASTRODUODENOSCOPY    . [place]   x3  . IUD REMOVAL  02/2015   Mirena  . PELVIC LAPAROSCOPY    . SHOULDER SURGERY  2007/2008  . SPINE SURGERY  2010   cervical    There were no vitals filed for this visit.  Subjective Assessment - 01/01/17 1240    Subjective  I have a headache today.  My shoulder pain and lower neck pain is better.  Patient has not had to use her home TENS unit the past two days.     Pertinent History  fibromyalgia, chronic pain    Limitations  House hold activities    Patient Stated Goals  get rid of the pain    Currently in Pain?  Yes    Pain Score  6     Pain Location  Neck    Pain Orientation  Right;Left    Pain Descriptors / Indicators  Sore;Constant    Pain Type  Chronic pain    Pain Onset  1 to 4 weeks ago    Pain Frequency  Constant  Aggravating Factors   not sure    Pain Relieving Factors  TENS and heat    Effect of Pain on Daily Activities  normal activities                      [facility] Adult PT Treatment/Exercise - 01/01/17 0001      Neck Exercises: Standing   Other Standing Exercises  row with yellow band - 20x cues to elongate neck and keep shoulder down   cues to elongate neck and keep shoulder down     Lumbar Exercises: Standing   Other Standing Lumbar Exercises  MELT technique to lumbar for fascial release of posterior chain supine   supine     Shoulder Exercises: Pulleys   Flexion  3 minutes      Manual Therapy   Manual Therapy  Myofascial release;Soft tissue mobilization    Soft tissue mobilization  cervical paraspinals, suboccipitals, scalenes, along the jaw line, interscapular    Myofascial Release  cervical parapsinals and suboccipitals; interscapular               PT Short Term Goals - 01/01/17 1324      PT SHORT TERM GOAL #1   Title  be independent in initial HEP    Baseline  doing the ball rolling  on the feet    Time  4    Period  Weeks    Status  Achieved      PT SHORT TERM GOAL #2   Title  increased shoulder flex and abduction 4/5 bilaterally without increased pain for improved overhead reaching    Time  4    Period  Weeks    Status  On-going      PT SHORT TERM GOAL #3   Title  50% reduction in muscle spasm for improved ROM during functional activities    Time  4    Period  Weeks    Status  On-going        PT Long Term Goals - 12/06/16 1344      PT LONG TERM GOAL #1   Title  be independent in advanced HEP    Baseline  ...    Time  8    Period  Weeks    Status  New    Target Date  01/31/17      PT LONG TERM GOAL #2   Title  able to drive using right UE due to improved strength     Baseline  ...    Time  8    Period  Weeks    Status  New    Target Date  01/31/17      PT LONG TERM GOAL #3   Title  report 75% less neck pain throughout the day with normal functional activities    Time  8    Period  Weeks    Status  New    Target Date  01/31/17      PT LONG TERM GOAL #4   Title  able to make the bed without increased pain or muscle spasms    Time  8    Period  Weeks    Status  New    Target Date  01/31/17      PT LONG TERM GOAL #5   Title  ...            Plan - 01/01/17 1320    Clinical Impression Statement  After therapy pain decreased   to 3/10, 50% better.  Patient has not used her TENS unit in the past few days due to feeling better in lower cervical area.  Patient is having had migraine today.  Patient will benefit from skilled PT for pain management and to work on scapula strength.     Rehab Potential  Excellent    PT Frequency  2x / week    PT Duration  8 weeks    PT Treatment/Interventions  ADLs/Self Care Home Management;Biofeedback;Cryotherapy;Electrical Stimulation;Iontophoresis 4mg/ml Dexamethasone;Moist Heat;Traction;Ultrasound;Gait training;Functional mobility training;Therapeutic activities;Therapeutic exercise;Neuromuscular  re-education;Patient/family education;Manual techniques;Passive range of motion;Dry needling;Taping    PT Next Visit Plan  KX has had therapy previously this year, myofascial release, cervical thoracic, ROM and stretches, cervical strength, rotation with towel, scapular stabilty    PT Home Exercise Plan  progress as needed    Recommended Other Services  MD signed initial eval    Consulted and Agree with Plan of Care  Patient       Patient will benefit from skilled therapeutic intervention in order to improve the following deficits and impairments:  Postural dysfunction, Increased muscle spasms, Pain, Decreased strength  Visit Diagnosis: Cervicalgia  Other muscle spasm  Muscle weakness (generalized)     Problem List Patient Active Problem List   Diagnosis Date Noted  . DDD (degenerative disc disease), cervical 07/14/2016  . Primary osteoarthritis of both feet 07/14/2016  . Primary osteoarthritis of both hands 07/14/2016  . Other fatigue 07/14/2016  . History of IBS 07/14/2016  . Osteopenia of multiple sites 07/14/2016  . Fibromyalgia 01/13/2016  . MGUS (monoclonal gammopathy of unknown significance) 12/23/2013  . Chronic cholecystitis without calculus 10/18/2012  . Abdominal pain, unspecified site 10/18/2012  . Nausea alone 10/18/2012  . Unspecified constipation 10/18/2012  . Depression 10/18/2012  . Bipolar 2 disorder ([facility]) 10/18/2012  . Anxiety   . ASCUS (atypical squamous cells of undetermined significance) on Pap smear   . IUD   . Hemorrhoids 11/29/2010  . Abdominal pain, left upper quadrant 11/29/2010    Cheryl Gray, PT 01/01/17 1:26 PM   [facility] [address], [address] , [place], [address] Phone: [phone]   Fax:  [phone]  Name: Syenna M Heiser MRN: [id number] Date of Birth: 02/27/1960   

## 2017-01-02 DIAGNOSIS — F3113 Bipolar disorder, current episode manic without psychotic features, severe: Secondary | ICD-10-CM | POA: Diagnosis not present

## 2017-01-03 ENCOUNTER — Ambulatory Visit: Payer: Medicare Other | Admitting: Physical Therapy

## 2017-01-03 DIAGNOSIS — M6281 Muscle weakness (generalized): Secondary | ICD-10-CM | POA: Diagnosis not present

## 2017-01-03 DIAGNOSIS — M62838 Other muscle spasm: Secondary | ICD-10-CM

## 2017-01-03 DIAGNOSIS — J301 Allergic rhinitis due to pollen: Secondary | ICD-10-CM | POA: Diagnosis not present

## 2017-01-03 DIAGNOSIS — J3081 Allergic rhinitis due to animal (cat) (dog) hair and dander: Secondary | ICD-10-CM | POA: Diagnosis not present

## 2017-01-03 DIAGNOSIS — J3089 Other allergic rhinitis: Secondary | ICD-10-CM | POA: Diagnosis not present

## 2017-01-03 DIAGNOSIS — M542 Cervicalgia: Secondary | ICD-10-CM

## 2017-01-03 NOTE — Therapy (Signed)
[facility] [address], [place], [place], [address] Phone: [phone]   Fax:  [phone]  Physical Therapy Treatment  Patient Details  Name: Danielle Bruce MRN: [id number] Date of Birth: [birth year] Referring Provider: Mayra Neer   Encounter Date: 01/03/2017  PT End of Session - 01/03/17 1447    Visit Number  7    Number of Visits  10    Date for PT Re-Evaluation  01/31/17    Authorization Type  gcodes at 10th visit    PT Start Time  1402    PT Stop Time  1447    PT Time Calculation (min)  45 min    Activity Tolerance  Patient tolerated treatment well    Behavior During Therapy  [facility] for tasks assessed/performed       Past Medical History:  Diagnosis Date  . Anemia   . Anxiety   . Arthritis    osteoarthritis  . ASCUS (atypical squamous cells of undetermined significance) on Pap smear 05/06/05   NEG HIGH RISK HPV--C&B BIOPSY BENIGN 12/2005  . Asthma   . Bipolar 2 disorder ([place])   . Cancer ([place])    skin cancer - basal cell  . Colon polyps   . Complication of anesthesia    anxious afterwards, will get headaches   . Constipation   . Depression   . Fibromyalgia 10/2013  . GERD (gastroesophageal reflux disease)   . 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 Heart murmur    never had any problems  . Hemorrhoids   . High cholesterol   . 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   cytology negative  . IBS (irritable bowel syndrome)   . Insomnia   . Lymphocytic colitis   . 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 Migraines   . Osteoarthritis   . Osteopenia   . Peripheral neuropathy   . PTSD (post-traumatic stress disorder)     Past Surgical History:  Procedure Laterality Date  . BONE MARROW BIOPSY Left 12/18/2013   Plasma cell dyscrasia 8% population of plasma cells  . BREAST BIOPSY Right    benign stereo  . CESAREAN SECTION  [phone]   . COLONOSCOPY     numerous times  . DILATION AND CURETTAGE OF UTERUS    . ESOPHAGOGASTRODUODENOSCOPY    . [place]   x3  . IUD REMOVAL  02/2015   Mirena  . PELVIC LAPAROSCOPY    . SHOULDER SURGERY  2007/2008  . SPINE SURGERY  2010   cervical    There were no vitals filed for this visit.  Subjective Assessment - 01/03/17 1406    Subjective  I feel much better overall.  It feels like it is more centralized to the spinal column.      Pertinent History  fibromyalgia, chronic pain, PTSD (cannot do exercises on back in gym area)    Limitations  House hold activities    Patient Stated Goals  get rid of the pain    Currently in Pain?  Yes    Pain Score  -- did not give a number   did not give a number   Pain Location  Neck    Pain Orientation  Right;Left    Pain Descriptors / Indicators  Sore    Pain Type  Chronic pain    Pain Onset  1 to 4 weeks ago  Pain Frequency  Constant    Aggravating Factors   not sure    Pain Relieving Factors  TENS and Heat    Effect of Pain on Daily Activities  normal activities                      [facility] Adult PT Treatment/Exercise - 01/03/17 0001      Neuro Re-ed    Neuro Re-ed Details   cervical retraction with UE horizontal abduction and shouler ER for improved posture with functional movements      Neck Exercises: Seated   Cervical Isometrics  Flexion      Manual Therapy   Manual Therapy  Myofascial release;Soft tissue mobilization    Soft tissue mobilization  suboccipitals, upper trap right side    Myofascial Release  cervical parapsinals and suboccipitals               PT Short Term Goals - 01/01/17 1324      PT SHORT TERM GOAL #1   Title  be independent in initial HEP    Baseline  doing the ball rolling on the feet    Time  4    Period  Weeks    Status  Achieved      PT SHORT TERM GOAL #2   Title  increased shoulder flex and abduction 4/5 bilaterally without increased pain for improved  overhead reaching    Time  4    Period  Weeks    Status  On-going      PT SHORT TERM GOAL #3   Title  50% reduction in muscle spasm for improved ROM during functional activities    Time  4    Period  Weeks    Status  On-going        PT Long Term Goals - 12/06/16 1344      PT LONG TERM GOAL #1   Title  be independent in advanced HEP    Baseline  ...    Time  8    Period  Weeks    Status  New    Target Date  01/31/17      PT LONG TERM GOAL #2   Title  able to drive using right UE due to improved strength     Baseline  ...    Time  8    Period  Weeks    Status  New    Target Date  01/31/17      PT LONG TERM GOAL #3   Title  report 75% less neck pain throughout the day with normal functional activities    Time  8    Period  Weeks    Status  New    Target Date  01/31/17      PT LONG TERM GOAL #4   Title  able to make the bed without increased pain or muscle spasms    Time  8    Period  Weeks    Status  New    Target Date  01/31/17      PT LONG TERM GOAL #5   Title  ...            Plan - 01/03/17 1537    Clinical Impression Statement  Pt did well with addition of anterior cervical strengthening.  She needed cues to perform exercises correctly.  She had good release with manual therapy.  Pt continues to need skilled PT for improved cervical strength and  ROM    Rehab Potential  Excellent    PT Treatment/Interventions  ADLs/Self Care Home Management;Biofeedback;Cryotherapy;Electrical Stimulation;Iontophoresis 59m/ml Dexamethasone;Moist Heat;Traction;Ultrasound;Gait training;Functional mobility training;Therapeutic activities;Therapeutic exercise;Neuromuscular re-education;Patient/family education;Manual techniques;Passive range of motion;Dry needling;Taping    PT Next Visit Plan  KX has had therapy previously this year, myofascial release, cervical thoracic, ROM and stretches, cervical strength, rotation with towel, scapular stabilty    Consulted and Agree with  Plan of Care  Patient       Patient will benefit from skilled therapeutic intervention in order to improve the following deficits and impairments:  Postural dysfunction, Increased muscle spasms, Pain, Decreased strength  Visit Diagnosis: Cervicalgia  Other muscle spasm  Muscle weakness (generalized)     Problem List Patient Active Problem List   Diagnosis Date Noted  . DDD (degenerative disc disease), cervical 07/14/2016  . Primary osteoarthritis of both feet 07/14/2016  . Primary osteoarthritis of both hands 07/14/2016  . Other fatigue 07/14/2016  . History of IBS 07/14/2016  . Osteopenia of multiple sites 07/14/2016  . Fibromyalgia 01/13/2016  . MGUS (monoclonal gammopathy of unknown significance) 12/23/2013  . Chronic cholecystitis without calculus 10/18/2012  . Abdominal pain, unspecified site 10/18/2012  . Nausea alone 10/18/2012  . Unspecified constipation 10/18/2012  . Depression 10/18/2012  . [place] 2 [place] ([place] 10/18/2012  . Anxiety   . ASCUS (atypical squamous cells of undetermined significance) on Pap smear   . IUD   . Hemorrhoids 11/29/2010  . Abdominal pain, left upper quadrant 11/29/2010    JZannie Cove PT 01/03/2017, 3:46 PM  [facility] [address] [place] [address]Phone: [phone]  Fax:  [phone] Name: Danielle FARONMRN: [id number] of Birth: 908/18/1962

## 2017-01-08 ENCOUNTER — Ambulatory Visit: Payer: Medicare Other | Admitting: Physical Therapy

## 2017-01-08 DIAGNOSIS — M62838 Other muscle spasm: Secondary | ICD-10-CM

## 2017-01-08 DIAGNOSIS — M6281 Muscle weakness (generalized): Secondary | ICD-10-CM

## 2017-01-08 DIAGNOSIS — M542 Cervicalgia: Secondary | ICD-10-CM | POA: Diagnosis not present

## 2017-01-08 DIAGNOSIS — J301 Allergic rhinitis due to pollen: Secondary | ICD-10-CM | POA: Diagnosis not present

## 2017-01-08 DIAGNOSIS — J3081 Allergic rhinitis due to animal (cat) (dog) hair and dander: Secondary | ICD-10-CM | POA: Diagnosis not present

## 2017-01-08 DIAGNOSIS — J3089 Other allergic rhinitis: Secondary | ICD-10-CM | POA: Diagnosis not present

## 2017-01-08 NOTE — Patient Instructions (Signed)
Posture:  1. Tongue positioned behind the front teeth   2. Sitting on sits bones  3. Feet on small step or stool  4. Spine should feel stacked and muscles are relaxed around spinal column  Exercises for upper cervical: 1. Click tongue - 6 x 6 sec 2. Hands clasped behind neck - pull head back lengthening back of neck hold 6 sec x 6 3. Pull head back and tuck chin and bring head back hold 6 sec x6

## 2017-01-08 NOTE — Therapy (Signed)
[facility] [address] [place], [place], [address] Phone: [phone]   Fax:  [phone]  Physical Therapy Treatment  Patient Details  Name: Danielle Bruce MRN: [id number] Date of Birth: 01/07/1961 Referring Provider: Mayra Neer   Encounter Date: 01/08/2017  PT End of Session - 01/08/17 1548    Visit Number  8    Number of Visits  10    Date for PT Re-Evaluation  01/31/17    Authorization Type  gcodes at 10th visit    PT Start Time  1406    PT Stop Time  1451    PT Time Calculation (min)  45 min    Activity Tolerance  Patient tolerated treatment well    Behavior During Therapy  [facility] for tasks assessed/performed       Past Medical History:  Diagnosis Date  . Anemia   . Anxiety   . Arthritis    osteoarthritis  . ASCUS (atypical squamous cells of undetermined significance) on Pap smear 05/06/05   NEG HIGH RISK HPV--C&B BIOPSY BENIGN 12/2005  . Asthma   . Bipolar 2 disorder ([place])   . Cancer ([place])    skin cancer - basal cell  . Colon polyps   . Complication of anesthesia    anxious afterwards, will get headaches   . Constipation   . Depression   . Fibromyalgia 10/2013  . GERD (gastroesophageal reflux disease)   . 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 Heart murmur    never had any problems  . Hemorrhoids   . High cholesterol   . 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   cytology negative  . IBS (irritable bowel syndrome)   . Insomnia   . Lymphocytic colitis   . 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 Migraines   . Osteoarthritis   . Osteopenia   . Peripheral neuropathy   . PTSD (post-traumatic stress disorder)     Past Surgical History:  Procedure Laterality Date  . BONE MARROW BIOPSY Left 12/18/2013   Plasma cell dyscrasia 8% population of plasma cells  . BREAST BIOPSY Right    benign stereo  . CESAREAN SECTION  [phone]   . COLONOSCOPY     numerous times  . DILATION AND CURETTAGE OF UTERUS    . ESOPHAGOGASTRODUODENOSCOPY    . [place]   x3  . IUD REMOVAL  02/2015   Mirena  . PELVIC LAPAROSCOPY    . SHOULDER SURGERY  2007/2008  . SPINE SURGERY  2010   cervical    There were no vitals filed for this visit.  Subjective Assessment - 01/08/17 1558    Subjective  I have not had any increased muscle spasms.  I can feel it very little when I really turn my head, but overall feeling much better. Pt denies pain at rest.    Pertinent History  fibromyalgia, chronic pain, PTSD (cannot do exercises on back in gym area)    Limitations  House hold activities    Patient Stated Goals  get rid of the pain    Currently in Pain?  No/denies                      [facility] Adult PT Treatment/Exercise - 01/08/17 0001      Neuro Re-ed    Neuro Re-ed Details   posture and cervical position throughout  all exercises      Neck Exercises: Standing   Other Standing Exercises  bent over row with yellow band - 20x cues to elongate neck and keep shoulder down    Other Standing Exercises  shoulder extension with cervical retraction - 20x      Neck Exercises: Seated   Cervical Isometrics  Flexion;Extension;Right lateral flexion;Left lateral flexion maintaining abdominal bracing and tongue position    Neck Retraction  10 reps;5 secs    Other Seated Exercise  shoulder ER - red band - 20x      Shoulder Exercises: Standing   Other Standing Exercises  pec stretch in doorway      Manual Therapy   Manual therapy comments  in supine with towel roll behind back    Myofascial Release  bilateral pec major/minor             PT Education - 01/08/17 1554    Education provided  Yes    Education Details  posture and [place] exercises 1-3    Person(s) Educated  Patient    Methods  Explanation;Demonstration;Verbal cues;Handout;Tactile cues    Comprehension  Verbalized understanding;Returned  demonstration       PT Short Term Goals - 01/08/17 1549      PT SHORT TERM GOAL #2   Title  increased shoulder flex and abduction 4/5 bilaterally without increased pain for improved overhead reaching    Time  4    Period  Weeks    Status  On-going      PT SHORT TERM GOAL #3   Title  50% reduction in muscle spasm for improved ROM during functional activities    Baseline  able to turn head with greater ease and no increased muscle spasms    Time  4    Period  Weeks    Status  On-going        PT Long Term Goals - 12/06/16 1344      PT LONG TERM GOAL #1   Title  be independent in advanced HEP    Baseline  ...    Time  8    Period  Weeks    Status  New    Target Date  01/31/17      PT LONG TERM GOAL #2   Title  able to drive using right UE due to improved strength     Baseline  ...    Time  8    Period  Weeks    Status  New    Target Date  01/31/17      PT LONG TERM GOAL #3   Title  report 75% less neck pain throughout the day with normal functional activities    Time  8    Period  Weeks    Status  New    Target Date  01/31/17      PT LONG TERM GOAL #4   Title  able to make the bed without increased pain or muscle spasms    Time  8    Period  Weeks    Status  New    Target Date  01/31/17      PT LONG TERM GOAL #5   Title  ...            Plan - 01/08/17 1555    Clinical Impression Statement  Patient demonstrates much more mobility with ROM and no increased muscle spasm.  Patient has not noticed any increaesd muscle spasms since previous visit.  She demonstrates weak anterior deep neck flexors and difficulty maintaining posture without verbal and tactile cues.  Pt will benefit from skilled PT to continue working on improved postural strength in order to have greater functional strength.      PT Treatment/Interventions  ADLs/Self Care Home Management;Biofeedback;Cryotherapy;Electrical Stimulation;Iontophoresis 19m/ml Dexamethasone;Moist  Heat;Traction;Ultrasound;Gait training;Functional mobility training;Therapeutic activities;Therapeutic exercise;Neuromuscular re-education;Patient/family education;Manual techniques;Passive range of motion;Dry needling;Taping    PT Next Visit Plan  KX has had therapy previously this year, myofascial release, cervical thoracic strength, posture, thoracic and cervical ROM and stretches, cervical strength, rotation with towel, scapular stabilty    PT Home Exercise Plan  progress as needed    Consulted and Agree with Plan of Care  Patient       Patient will benefit from skilled therapeutic intervention in order to improve the following deficits and impairments:  Postural dysfunction, Increased muscle spasms, Pain, Decreased strength  Visit Diagnosis: Cervicalgia  Other muscle spasm  Muscle weakness (generalized)     Problem List Patient Active Problem List   Diagnosis Date Noted  . DDD (degenerative disc disease), cervical 07/14/2016  . Primary osteoarthritis of both feet 07/14/2016  . Primary osteoarthritis of both hands 07/14/2016  . Other fatigue 07/14/2016  . History of IBS 07/14/2016  . Osteopenia of multiple sites 07/14/2016  . Fibromyalgia 01/13/2016  . MGUS (monoclonal gammopathy of unknown significance) 12/23/2013  . Chronic cholecystitis without calculus 10/18/2012  . Abdominal pain, unspecified site 10/18/2012  . Nausea alone 10/18/2012  . Unspecified constipation 10/18/2012  . Depression 10/18/2012  . Bipolar 2 disorder ([place] 10/18/2012  . Anxiety   . ASCUS (atypical squamous cells of undetermined significance) on Pap smear   . IUD   . Hemorrhoids 11/29/2010  . Abdominal pain, left upper quadrant 11/29/2010    JZannie Cove PT 01/08/2017, 3:59 PM  [facility] [address] [place] [address]Phone: [phone]  Fax:  [phone] Name: Danielle HARGROVEMRN: [id number] of Birth:  9Jan 15, 1962

## 2017-01-09 DIAGNOSIS — F4310 Post-traumatic stress disorder, unspecified: Secondary | ICD-10-CM | POA: Diagnosis not present

## 2017-01-09 DIAGNOSIS — K5289 Other specified noninfective gastroenteritis and colitis: Secondary | ICD-10-CM | POA: Diagnosis not present

## 2017-01-09 DIAGNOSIS — G43909 Migraine, unspecified, not intractable, without status migrainosus: Secondary | ICD-10-CM | POA: Diagnosis not present

## 2017-01-09 DIAGNOSIS — F900 Attention-deficit hyperactivity disorder, predominantly inattentive type: Secondary | ICD-10-CM | POA: Diagnosis not present

## 2017-01-09 DIAGNOSIS — E7800 Pure hypercholesterolemia, unspecified: Secondary | ICD-10-CM | POA: Diagnosis not present

## 2017-01-09 DIAGNOSIS — F3181 Bipolar II disorder: Secondary | ICD-10-CM | POA: Diagnosis not present

## 2017-01-09 DIAGNOSIS — G629 Polyneuropathy, unspecified: Secondary | ICD-10-CM | POA: Diagnosis not present

## 2017-01-09 DIAGNOSIS — J45909 Unspecified asthma, uncomplicated: Secondary | ICD-10-CM | POA: Diagnosis not present

## 2017-01-09 DIAGNOSIS — F411 Generalized anxiety disorder: Secondary | ICD-10-CM | POA: Diagnosis not present

## 2017-01-09 DIAGNOSIS — F3113 Bipolar disorder, current episode manic without psychotic features, severe: Secondary | ICD-10-CM | POA: Diagnosis not present

## 2017-01-09 DIAGNOSIS — Z0000 Encounter for general adult medical examination without abnormal findings: Secondary | ICD-10-CM | POA: Diagnosis not present

## 2017-01-09 DIAGNOSIS — K219 Gastro-esophageal reflux disease without esophagitis: Secondary | ICD-10-CM | POA: Diagnosis not present

## 2017-01-09 DIAGNOSIS — D472 Monoclonal gammopathy: Secondary | ICD-10-CM | POA: Diagnosis not present

## 2017-01-10 ENCOUNTER — Ambulatory Visit: Payer: Medicare Other | Admitting: Physical Therapy

## 2017-01-10 ENCOUNTER — Encounter: Payer: Self-pay | Admitting: Physical Therapy

## 2017-01-10 DIAGNOSIS — M6281 Muscle weakness (generalized): Secondary | ICD-10-CM

## 2017-01-10 DIAGNOSIS — M62838 Other muscle spasm: Secondary | ICD-10-CM | POA: Diagnosis not present

## 2017-01-10 DIAGNOSIS — M542 Cervicalgia: Secondary | ICD-10-CM

## 2017-01-10 NOTE — Therapy (Signed)
[facility] [address], [place], [place], [address] Phone: [phone]   Fax:  [phone]  Physical Therapy Treatment  Patient Details  Name: Danielle Bruce MRN: [id number] Date of Birth: 07/22/1960 Referring Provider: Mayra Neer   Encounter Date: 01/10/2017  PT End of Session - 01/10/17 1412    Visit Number  9    Number of Visits  10    Date for PT Re-Evaluation  01/31/17    Authorization Type  gcodes at 10th visit    PT Start Time  1402    PT Stop Time  1442    PT Time Calculation (min)  40 min    Activity Tolerance  Patient tolerated treatment well    Behavior During Therapy  [facility] for tasks assessed/performed       Past Medical History:  Diagnosis Date  . Anemia   . Anxiety   . Arthritis    osteoarthritis  . ASCUS (atypical squamous cells of undetermined significance) on Pap smear 05/06/05   NEG HIGH RISK HPV--C&B BIOPSY BENIGN 12/2005  . Asthma   . Bipolar 2 disorder ([place])   . Cancer ([place])    skin cancer - basal cell  . Colon polyps   . Complication of anesthesia    anxious afterwards, will get headaches   . Constipation   . Depression   . Fibromyalgia 10/2013  . GERD (gastroesophageal reflux disease)   . 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 Heart murmur    never had any problems  . Hemorrhoids   . High cholesterol   . 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   cytology negative  . IBS (irritable bowel syndrome)   . Insomnia   . Lymphocytic colitis   . 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 Migraines   . Osteoarthritis   . Osteopenia   . Peripheral neuropathy   . PTSD (post-traumatic stress disorder)     Past Surgical History:  Procedure Laterality Date  . BONE MARROW BIOPSY Left 12/18/2013   Plasma cell dyscrasia 8% population of plasma cells  . BREAST BIOPSY Right    benign stereo  . CESAREAN SECTION  [phone]   . COLONOSCOPY     numerous times  . DILATION AND CURETTAGE OF UTERUS    . ESOPHAGOGASTRODUODENOSCOPY    . [place]   x3  . IUD REMOVAL  02/2015   Mirena  . PELVIC LAPAROSCOPY    . SHOULDER SURGERY  2007/2008  . SPINE SURGERY  2010   cervical    There were no vitals filed for this visit.  Subjective Assessment - 01/10/17 1404    Subjective  I am having pain but it is wet and cold.  I am still much better than when I started    Pertinent History  fibromyalgia, chronic pain, PTSD (cannot do exercises on back in gym area)    Limitations  House hold activities    Patient Stated Goals  get rid of the pain    Currently in Pain?  No/denies         [name] - 01/10/17 0001      AROM   Cervical - Right Rotation  60    Cervical - Left Rotation  60      Strength   Right Shoulder Flexion  4+/5    Right Shoulder ABduction  4+/5  Left Shoulder Flexion  4+/5    Left Shoulder ABduction  4+/5                  [facility] Adult PT Treatment/Exercise - 01/10/17 0001      Neck Exercises: Seated   Neck Retraction  10 reps;5 secs    Other Seated Exercise  jaw isometric with tongue positioning - 6 x 6 sec holding to each side      Manual Therapy   Manual Therapy  Passive ROM    Manual therapy comments  in supine with hot back behind back    Myofascial Release  cervical parapsinals and suboccipitals, upper traps    Passive ROM  cervical SB and rotation               PT Short Term Goals - 01/08/17 1549      PT SHORT TERM GOAL #2   Title  increased shoulder flex and abduction 4/5 bilaterally without increased pain for improved overhead reaching    Time  4    Period  Weeks    Status  On-going      PT SHORT TERM GOAL #3   Title  50% reduction in muscle spasm for improved ROM during functional activities    Baseline  able to turn head with greater ease and no increased muscle spasms    Time  4    Period  Weeks    Status  On-going         PT Long Term Goals - 12/06/16 1344      PT LONG TERM GOAL #1   Title  be independent in advanced HEP    Baseline  ...    Time  8    Period  Weeks    Status  New    Target Date  01/31/17      PT LONG TERM GOAL #2   Title  able to drive using right UE due to improved strength     Baseline  ...    Time  8    Period  Weeks    Status  New    Target Date  01/31/17      PT LONG TERM GOAL #3   Title  report 75% less neck pain throughout the day with normal functional activities    Time  8    Period  Weeks    Status  New    Target Date  01/31/17      PT LONG TERM GOAL #4   Title  able to make the bed without increased pain or muscle spasms    Time  8    Period  Weeks    Status  New    Target Date  01/31/17      PT LONG TERM GOAL #5   Title  ...            Plan - 01/10/17 1407    Clinical Impression Statement  Patient was monitored for pain through exercises.  She needs cues to reduce forward head.  She continues to demonstrate fatigue with exercises and began getting some muscle spasm.  She responded well to manual therapy and had increased ROM when doing PROM after treatment.  Pt will benefit from skilled PT in order to work on improved postural strength.    Rehab Potential  Excellent    PT Treatment/Interventions  ADLs/Self Care Home Management;Biofeedback;Cryotherapy;Electrical Stimulation;Iontophoresis 54m/ml Dexamethasone;Moist Heat;Traction;Ultrasound;Gait training;Functional mobility training;Therapeutic activities;Therapeutic exercise;Neuromuscular re-education;Patient/family education;Manual techniques;Passive range  of motion;Dry needling;Taping    PT Next Visit Plan  KX has had therapy previously this year, myofascial release, cervical thoracic strength, posture, thoracic and cervical ROM and stretches, cervical strength, rotation with towel, scapular stabilty    PT Home Exercise Plan  progress as needed    Consulted and Agree with Plan of Care  Patient        Patient will benefit from skilled therapeutic intervention in order to improve the following deficits and impairments:  Postural dysfunction, Increased muscle spasms, Pain, Decreased strength  Visit Diagnosis: Cervicalgia  Other muscle spasm  Muscle weakness (generalized)     Problem List Patient Active Problem List   Diagnosis Date Noted  . DDD (degenerative disc disease), cervical 07/14/2016  . Primary osteoarthritis of both feet 07/14/2016  . Primary osteoarthritis of both hands 07/14/2016  . Other fatigue 07/14/2016  . History of IBS 07/14/2016  . Osteopenia of multiple sites 07/14/2016  . Fibromyalgia 01/13/2016  . MGUS (monoclonal gammopathy of unknown significance) 12/23/2013  . Chronic cholecystitis without calculus 10/18/2012  . Abdominal pain, unspecified site 10/18/2012  . Nausea alone 10/18/2012  . Unspecified constipation 10/18/2012  . Depression 10/18/2012  . Bipolar 2 disorder ([place]) 10/18/2012  . Anxiety   . ASCUS (atypical squamous cells of undetermined significance) on Pap smear   . IUD   . Hemorrhoids 11/29/2010  . Abdominal pain, left upper quadrant 11/29/2010    Zannie Cove, PT 01/10/2017, 2:50 PM  [facility] [address], [place], [place], [address] Phone: [phone]   Fax:  [phone]  Name: BETTYANN BIRCHLER MRN: [id number] Date of Birth: [birth year]

## 2017-01-12 DIAGNOSIS — J3089 Other allergic rhinitis: Secondary | ICD-10-CM | POA: Diagnosis not present

## 2017-01-12 DIAGNOSIS — J301 Allergic rhinitis due to pollen: Secondary | ICD-10-CM | POA: Diagnosis not present

## 2017-01-12 DIAGNOSIS — J3081 Allergic rhinitis due to animal (cat) (dog) hair and dander: Secondary | ICD-10-CM | POA: Diagnosis not present

## 2017-01-15 ENCOUNTER — Ambulatory Visit: Payer: Medicare Other | Admitting: Physical Therapy

## 2017-01-15 ENCOUNTER — Encounter: Payer: Self-pay | Admitting: Physical Therapy

## 2017-01-15 DIAGNOSIS — M62838 Other muscle spasm: Secondary | ICD-10-CM

## 2017-01-15 DIAGNOSIS — M542 Cervicalgia: Secondary | ICD-10-CM

## 2017-01-15 DIAGNOSIS — M6281 Muscle weakness (generalized): Secondary | ICD-10-CM

## 2017-01-15 NOTE — Therapy (Addendum)
[facility] [address], [place], [place], [address] Phone: [phone]   Fax:  [phone]  Physical Therapy Treatment  Patient Details  Name: Danielle Bruce MRN: [id number] Date of Birth: 1960/06/16 Referring Provider: Mayra Neer   Encounter Date: 01/15/2017  PT End of Session - 01/15/17 1446    Visit Number  10    Number of Visits  20    Date for PT Re-Evaluation  01/31/17    Authorization Type  gcodes at 20th visit    PT Start Time  1404    PT Stop Time  1445    PT Time Calculation (min)  41 min    Activity Tolerance  Patient tolerated treatment well    Behavior During Therapy  [facility] for tasks assessed/performed       Past Medical History:  Diagnosis Date  . Anemia   . Anxiety   . Arthritis    osteoarthritis  . ASCUS (atypical squamous cells of undetermined significance) on Pap smear 05/06/05   NEG HIGH RISK HPV--C&B BIOPSY BENIGN 12/2005  . Asthma   . Bipolar 2 disorder ([place])   . Cancer ([place])    skin cancer - basal cell  . Colon polyps   . Complication of anesthesia    anxious afterwards, will get headaches   . Constipation   . Depression   . Fibromyalgia 10/2013  . GERD (gastroesophageal reflux disease)   . 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 Heart murmur    never had any problems  . Hemorrhoids   . High cholesterol   . 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   cytology negative  . IBS (irritable bowel syndrome)   . Insomnia   . Lymphocytic colitis   . 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 Migraines   . Osteoarthritis   . Osteopenia   . Peripheral neuropathy   . PTSD (post-traumatic stress disorder)     Past Surgical History:  Procedure Laterality Date  . BONE MARROW BIOPSY Left 12/18/2013   Plasma cell dyscrasia 8% population of plasma cells  . BREAST BIOPSY Right    benign stereo  . CESAREAN SECTION  [phone]   . COLONOSCOPY     numerous times  . DILATION AND CURETTAGE OF UTERUS    . ESOPHAGOGASTRODUODENOSCOPY    . [place]   x3  . IUD REMOVAL  02/2015   Mirena  . LAPAROSCOPIC CHOLECYSTECTOMY N/A 10/16/2012   Performed by Erroll Luna, MD at [facility] ORS  . LAPAROSCOPIC LYSIS OF ADHESIONS N/A 10/16/2012   Performed by Erroll Luna, MD at [facility] ORS  . LAPAROSCOPY DIAGNOSTIC N/A 10/16/2012   Performed by Erroll Luna, MD at [facility] ORS  . MRI OF BRAIN WITH AND WITHOUT CONTRAST N/A 12/16/2015   Performed by Radiologist, Medication, MD at [facility]  . PELVIC LAPAROSCOPY    . SHOULDER SURGERY  2007/2008  . SPINE SURGERY  2010   cervical    There were no vitals filed for this visit.  Subjective Assessment - 01/15/17 1405    Subjective  Patient states she is feeling good today.  Not really any pain above her baseline fibromyalgia pain.  States she has been doing all normal activities without increased pain.    Pertinent History  fibromyalgia, chronic pain, PTSD (cannot do exercises on back in gym area)    Limitations  House hold  activities    Patient Stated Goals  get rid of the pain    Currently in Pain?  No/denies                      [facility] Adult PT Treatment/Exercise - 01/15/17 0001      Neck Exercises: Standing   Other Standing Exercises  bent over row with yellow band - 20x cues to elongate neck and keep shoulder down    Other Standing Exercises  shoulder extension with cervical retraction - 20x      Neck Exercises: Seated   Neck Retraction  5 secs;10 reps 2 sets    Shoulder Shrugs  15 reps    Shoulder Rolls  15 reps    Other Seated Exercise  shoulder ER with good cervical posture      Neck Exercises: Supine   Cervical Rotation  Both;10 reps    Lateral Flexion  Both;5 reps 10 sec hold    Other Supine Exercise  shoulder horizontal abduction on foam roll 20x    Other Supine Exercise  pec stretch lying on foam roll       Shoulder Exercises: Standing    Extension  Strengthening;20 reps;Theraband    Theraband Level (Shoulder Extension)  Level 1 (Yellow)    Row  Strengthening;Both;20 reps;Theraband    Theraband Level (Shoulder Row)  Level 2 (Red)    Other Standing Exercises  diagonals - 20x each yellow band bilateral      Shoulder Exercises: Pulleys   Flexion  3 minutes               PT Short Term Goals - 01/15/17 1554      PT SHORT TERM GOAL #1   Title  be independent in initial HEP    Time  4    Period  Weeks    Status  Achieved      PT SHORT TERM GOAL #2   Title  increased shoulder flex and abduction 4/5 bilaterally without increased pain for improved overhead reaching    Baseline  able to do band exercises without pain or spasms    Time  4    Period  Weeks    Status  On-going      PT SHORT TERM GOAL #3   Title  50% reduction in muscle spasm for improved ROM during functional activities    Time  4    Period  Weeks    Status  Achieved        PT Long Term Goals - 01/15/17 1448      PT LONG TERM GOAL #1   Title  be independent in advanced HEP    Time  8    Period  Weeks    Status  On-going      PT LONG TERM GOAL #2   Title  able to drive using right UE due to improved strength     Baseline  improved, no flare ups with driving    Time  8    Period  Weeks    Status  On-going      PT LONG TERM GOAL #3   Title  report 75% less neck pain throughout the day with normal functional activities    Time  8    Period  Weeks    Status  On-going      PT LONG TERM GOAL #4   Title  able to make the bed without increased pain or muscle  spasms    Baseline  raking leaves without increased pain    Time  8    Period  Weeks    Status  On-going            Plan - 18-Jan-2017 1403    Clinical Impression Statement  Patient did well with exercises today.  Currently no pain with AROM and has demonstrated small improvements in AROM.  No increased pain throughout treatment today.  She was able to perform band exercises  while maintaining good cervical alignment.  She continues to benefit from skilled PT for increased strength and ROM for return to functional activities without increased pain.    PT Treatment/Interventions  ADLs/Self Care Home Management;Biofeedback;Cryotherapy;Electrical Stimulation;Iontophoresis 87m/ml Dexamethasone;Moist Heat;Traction;Ultrasound;Gait training;Functional mobility training;Therapeutic activities;Therapeutic exercise;Neuromuscular re-education;Patient/family education;Manual techniques;Passive range of motion;Dry needling;Taping    PT Next Visit Plan  KX has had therapy previously this year, myofascial release, cervical thoracic strength, posture, thoracic and cervical ROM and stretches, cervical strength, rotation with towel, scapular stabilty    PT Home Exercise Plan  progress as needed    Consulted and Agree with Plan of Care  Patient       Patient will benefit from skilled therapeutic intervention in order to improve the following deficits and impairments:  Postural dysfunction, Increased muscle spasms, Pain, Decreased strength  Visit Diagnosis: Cervicalgia  Other muscle spasm  Muscle weakness (generalized)   G-Codes - [id number]    Functional Assessment Tool Used (Outpatient Only)  clinical assessment    Functional Limitation  Changing and maintaining body position    Changing and Maintaining Body Position Current Status ((S1683  At least 40 percent but less than 60 percent impaired, limited or restricted    Changing and Maintaining Body Position Goal Status ((F2902  At least 20 percent but less than 40 percent impaired, limited or restricted      Discharge: at least 442but less than 60 percent limited Problem List Patient Active Problem List   Diagnosis Date Noted  . DDD (degenerative disc disease), cervical 07/14/2016  . Primary osteoarthritis of both feet 07/14/2016  . Primary osteoarthritis of both hands 07/14/2016  . Other fatigue 07/14/2016  . History  of IBS 07/14/2016  . Osteopenia of multiple sites 07/14/2016  . Fibromyalgia 01/13/2016  . MGUS (monoclonal gammopathy of unknown significance) 12/23/2013  . Chronic cholecystitis without calculus 10/18/2012  . Abdominal pain, unspecified site 10/18/2012  . Nausea alone 10/18/2012  . Unspecified constipation 10/18/2012  . Depression 10/18/2012  . B[place] 2 disorder ([place] 10/18/2012  . Anxiety   . ASCUS (atypical squamous cells of undetermined significance) on Pap smear   . IUD   . Hemorrhoids 11/29/2010  . Abdominal pain, left upper quadrant 11/29/2010    JZannie Cove PT 1November 22, 2018 3:54 PM  [facility] [address] [place] [address]Phone: [phone]  Fax:  [phone] Name: CBRYLINN TEANEYMRN: [id number] of Birth: 902/02/1960 PHYSICAL THERAPY DISCHARGE SUMMARY  Visits from Start of Care: 10  Current functional level related to goals / functional outcomes: See above goals   Remaining deficits: See above   Education / Equipment: HEP  Plan: Patient agrees to discharge.  Patient goals were not met. Patient is being discharged due to not returning since the last visit.  ?????       JGoogle PT 02/14/17 3:04 PM

## 2017-01-16 DIAGNOSIS — F3113 Bipolar disorder, current episode manic without psychotic features, severe: Secondary | ICD-10-CM | POA: Diagnosis not present

## 2017-01-17 ENCOUNTER — Encounter: Payer: Medicare Other | Admitting: Physical Therapy

## 2017-01-22 ENCOUNTER — Encounter: Payer: Medicare Other | Admitting: Physical Therapy

## 2017-01-24 ENCOUNTER — Encounter: Payer: Medicare Other | Admitting: Physical Therapy

## 2017-01-31 ENCOUNTER — Ambulatory Visit: Payer: Medicare Other | Admitting: Physical Therapy

## 2017-02-02 ENCOUNTER — Encounter: Payer: Medicare Other | Admitting: Physical Therapy

## 2017-02-13 DIAGNOSIS — J069 Acute upper respiratory infection, unspecified: Secondary | ICD-10-CM | POA: Diagnosis not present

## 2017-02-13 DIAGNOSIS — F3181 Bipolar II disorder: Secondary | ICD-10-CM | POA: Diagnosis not present

## 2017-02-13 DIAGNOSIS — G43909 Migraine, unspecified, not intractable, without status migrainosus: Secondary | ICD-10-CM | POA: Diagnosis not present

## 2017-02-28 DIAGNOSIS — J301 Allergic rhinitis due to pollen: Secondary | ICD-10-CM | POA: Diagnosis not present

## 2017-02-28 DIAGNOSIS — J3081 Allergic rhinitis due to animal (cat) (dog) hair and dander: Secondary | ICD-10-CM | POA: Diagnosis not present

## 2017-02-28 DIAGNOSIS — F3113 Bipolar disorder, current episode manic without psychotic features, severe: Secondary | ICD-10-CM | POA: Diagnosis not present

## 2017-02-28 DIAGNOSIS — J3089 Other allergic rhinitis: Secondary | ICD-10-CM | POA: Diagnosis not present

## 2017-03-01 DIAGNOSIS — F3113 Bipolar disorder, current episode manic without psychotic features, severe: Secondary | ICD-10-CM | POA: Diagnosis not present

## 2017-03-05 ENCOUNTER — Ambulatory Visit (INDEPENDENT_AMBULATORY_CARE_PROVIDER_SITE_OTHER): Payer: Medicare Other | Admitting: Gynecology

## 2017-03-05 ENCOUNTER — Encounter: Payer: Self-pay | Admitting: Gynecology

## 2017-03-05 VITALS — BP 124/70 | Ht 62.0 in | Wt 135.0 lb

## 2017-03-05 DIAGNOSIS — Z0000 Encounter for general adult medical examination without abnormal findings: Secondary | ICD-10-CM

## 2017-03-05 DIAGNOSIS — Z124 Encounter for screening for malignant neoplasm of cervix: Secondary | ICD-10-CM | POA: Diagnosis not present

## 2017-03-05 DIAGNOSIS — Z01411 Encounter for gynecological examination (general) (routine) with abnormal findings: Secondary | ICD-10-CM

## 2017-03-05 DIAGNOSIS — N952 Postmenopausal atrophic vaginitis: Secondary | ICD-10-CM

## 2017-03-05 NOTE — Addendum Note (Signed)
Addended by: Nelva Nay on: 03/05/2017 03:16 PM   Modules accepted: Orders

## 2017-03-05 NOTE — Progress Notes (Signed)
    Danielle Bruce 08/14/1960 [id number]        57 y.o.  [id number] for breast and pelvic exam.  Doing well without gynecologic complaints  Past medical history,surgical history, problem list, medications, allergies, family history and social history were all reviewed and documented as reviewed in the EPIC chart.  ROS:  Performed with pertinent positives and negatives included in the history, assessment and plan.   Additional significant findings : None   Exam: [name] assistant Vitals:   03/05/17 1353  BP: 124/70  Weight: 135 lb (61.2 kg)  Height: 5\' 2"  (1.575 m)   Body mass index is 24.69 kg/m.  General appearance:  Normal affect, orientation and appearance. Skin: Grossly normal HEENT: Without gross lesions.  No cervical or supraclavicular adenopathy. Thyroid normal.  Lungs:  Clear without wheezing, rales or rhonchi Cardiac: RR, without RMG Abdominal:  Soft, nontender, without masses, guarding, rebound, organomegaly or hernia Breasts:  Examined lying and sitting without masses, retractions, discharge or axillary adenopathy. Pelvic:  Ext, BUS, Vagina: Normal with mild atrophic changes  Cervix: Normal with mild atrophic changes.  Pap smear done  Uterus: Anteverted, normal size, shape and contour, midline and mobile nontender   Adnexa: Without masses or tenderness    Anus and perineum: Normal   Rectovaginal: Normal sphincter tone without palpated masses or tenderness.    Assessment/Plan:  57 y.o. [id number] female for breast and pelvic exam.   1. Postmenopausal/atrophic genital changes.  No significant hot flushes, night sweats, vaginal dryness or any vaginal bleeding.  Continue to monitor and report any issues or bleeding.  Had episode of tinged spotting in August but no bleeding since.  Had discussed HRT in the past but she elected not to start this. 2. Pap smear/HPV 2015.  Pap smear done today due to patient's concerns.  She does have a history of positive HPV with normal  cytology 2013.  Pap smears 2014, 2015 x 2 negative. 3. Mammography coming due in February and I reminded her to schedule this.  Breast exam normal today.  SBE monthly reviewed. 4. Colonoscopy 2016.  Repeat at their recommended interval. 5. Reported osteopenia.  Being followed elsewhere for bone health with reported bone density scheduled for next month.  I do not have copies of her bone densities.  She will follow-up with her other physicians in reference to this. 6. Health maintenance.  No routine lab work done as patient does this elsewhere.  Follow-up 1 year, sooner as needed.   Anastasio Auerbach MD, 2:24 PM 03/05/2017

## 2017-03-05 NOTE — Patient Instructions (Signed)
Follow-up in 1 year for annual exam, sooner if any issues. 

## 2017-03-06 ENCOUNTER — Encounter: Payer: Self-pay | Admitting: Gynecology

## 2017-03-06 LAB — PAP IG W/ RFLX HPV ASCU

## 2017-03-07 DIAGNOSIS — J301 Allergic rhinitis due to pollen: Secondary | ICD-10-CM | POA: Diagnosis not present

## 2017-03-07 DIAGNOSIS — J3081 Allergic rhinitis due to animal (cat) (dog) hair and dander: Secondary | ICD-10-CM | POA: Diagnosis not present

## 2017-03-07 DIAGNOSIS — J3089 Other allergic rhinitis: Secondary | ICD-10-CM | POA: Diagnosis not present

## 2017-03-07 DIAGNOSIS — F3113 Bipolar disorder, current episode manic without psychotic features, severe: Secondary | ICD-10-CM | POA: Diagnosis not present

## 2017-03-09 ENCOUNTER — Other Ambulatory Visit: Payer: Self-pay | Admitting: Gynecology

## 2017-03-09 DIAGNOSIS — Z139 Encounter for screening, unspecified: Secondary | ICD-10-CM

## 2017-03-21 DIAGNOSIS — M25531 Pain in right wrist: Secondary | ICD-10-CM | POA: Diagnosis not present

## 2017-03-21 DIAGNOSIS — J309 Allergic rhinitis, unspecified: Secondary | ICD-10-CM | POA: Diagnosis not present

## 2017-03-21 DIAGNOSIS — M25532 Pain in left wrist: Secondary | ICD-10-CM | POA: Diagnosis not present

## 2017-03-21 DIAGNOSIS — Z6825 Body mass index (BMI) 25.0-25.9, adult: Secondary | ICD-10-CM | POA: Diagnosis not present

## 2017-03-27 DIAGNOSIS — J309 Allergic rhinitis, unspecified: Secondary | ICD-10-CM | POA: Diagnosis not present

## 2017-03-29 DIAGNOSIS — D485 Neoplasm of uncertain behavior of skin: Secondary | ICD-10-CM | POA: Diagnosis not present

## 2017-03-29 DIAGNOSIS — G43909 Migraine, unspecified, not intractable, without status migrainosus: Secondary | ICD-10-CM | POA: Diagnosis not present

## 2017-03-29 DIAGNOSIS — E7212 Methylenetetrahydrofolate reductase deficiency: Secondary | ICD-10-CM | POA: Diagnosis not present

## 2017-03-29 DIAGNOSIS — G5600 Carpal tunnel syndrome, unspecified upper limb: Secondary | ICD-10-CM | POA: Diagnosis not present

## 2017-03-29 DIAGNOSIS — F3113 Bipolar disorder, current episode manic without psychotic features, severe: Secondary | ICD-10-CM | POA: Diagnosis not present

## 2017-03-29 DIAGNOSIS — L430 Hypertrophic lichen planus: Secondary | ICD-10-CM | POA: Diagnosis not present

## 2017-03-29 DIAGNOSIS — F3181 Bipolar II disorder: Secondary | ICD-10-CM | POA: Diagnosis not present

## 2017-03-29 DIAGNOSIS — K589 Irritable bowel syndrome without diarrhea: Secondary | ICD-10-CM | POA: Diagnosis not present

## 2017-03-29 DIAGNOSIS — F411 Generalized anxiety disorder: Secondary | ICD-10-CM | POA: Diagnosis not present

## 2017-03-29 DIAGNOSIS — L308 Other specified dermatitis: Secondary | ICD-10-CM | POA: Diagnosis not present

## 2017-03-29 DIAGNOSIS — Z85828 Personal history of other malignant neoplasm of skin: Secondary | ICD-10-CM | POA: Diagnosis not present

## 2017-03-29 DIAGNOSIS — D472 Monoclonal gammopathy: Secondary | ICD-10-CM | POA: Diagnosis not present

## 2017-03-29 DIAGNOSIS — E7800 Pure hypercholesterolemia, unspecified: Secondary | ICD-10-CM | POA: Diagnosis not present

## 2017-04-03 DIAGNOSIS — J309 Allergic rhinitis, unspecified: Secondary | ICD-10-CM | POA: Diagnosis not present

## 2017-04-10 DIAGNOSIS — J309 Allergic rhinitis, unspecified: Secondary | ICD-10-CM | POA: Diagnosis not present

## 2017-04-20 DIAGNOSIS — J309 Allergic rhinitis, unspecified: Secondary | ICD-10-CM | POA: Diagnosis not present

## 2017-04-27 DIAGNOSIS — J309 Allergic rhinitis, unspecified: Secondary | ICD-10-CM | POA: Diagnosis not present

## 2017-04-30 DIAGNOSIS — M25531 Pain in right wrist: Secondary | ICD-10-CM | POA: Diagnosis not present

## 2017-04-30 DIAGNOSIS — Z6825 Body mass index (BMI) 25.0-25.9, adult: Secondary | ICD-10-CM | POA: Diagnosis not present

## 2017-04-30 DIAGNOSIS — M25521 Pain in right elbow: Secondary | ICD-10-CM | POA: Diagnosis not present

## 2017-05-02 DIAGNOSIS — M25531 Pain in right wrist: Secondary | ICD-10-CM | POA: Diagnosis not present

## 2017-05-02 DIAGNOSIS — M1811 Unilateral primary osteoarthritis of first carpometacarpal joint, right hand: Secondary | ICD-10-CM | POA: Diagnosis not present

## 2017-05-04 DIAGNOSIS — J309 Allergic rhinitis, unspecified: Secondary | ICD-10-CM | POA: Diagnosis not present

## 2017-05-11 DIAGNOSIS — J309 Allergic rhinitis, unspecified: Secondary | ICD-10-CM | POA: Diagnosis not present

## 2017-05-17 DIAGNOSIS — J309 Allergic rhinitis, unspecified: Secondary | ICD-10-CM | POA: Diagnosis not present

## 2017-05-18 DIAGNOSIS — M797 Fibromyalgia: Secondary | ICD-10-CM | POA: Diagnosis not present

## 2017-05-18 DIAGNOSIS — G43909 Migraine, unspecified, not intractable, without status migrainosus: Secondary | ICD-10-CM | POA: Diagnosis not present

## 2017-05-18 DIAGNOSIS — F411 Generalized anxiety disorder: Secondary | ICD-10-CM | POA: Diagnosis not present

## 2017-05-23 DIAGNOSIS — M1812 Unilateral primary osteoarthritis of first carpometacarpal joint, left hand: Secondary | ICD-10-CM | POA: Diagnosis not present

## 2017-05-25 DIAGNOSIS — J309 Allergic rhinitis, unspecified: Secondary | ICD-10-CM | POA: Diagnosis not present

## 2017-05-29 ENCOUNTER — Telehealth: Payer: Self-pay | Admitting: Medical Oncology

## 2017-05-29 NOTE — Telephone Encounter (Signed)
Bilateral enlarged tender lymph nodes under jaw x 1-2 days.Last night she had  "jaw pain , felt like she I got  hit in jaw. The lymph nodes went down and then came up again today ' .  She has severe allergies and is getting allergy shots. I told her to see local PCP. She saw [facility] in 2017 for  MGUS. Lives in [place] now . She will be back in [place] in June for appt with  Debenshwar. Should she be concerned?

## 2017-05-30 NOTE — Telephone Encounter (Signed)
Pt notified . She said they are smaller and "Much better 'today.

## 2017-05-30 NOTE — Telephone Encounter (Signed)
Yes. She needs to see her PCP for evaluation.

## 2017-05-31 DIAGNOSIS — J309 Allergic rhinitis, unspecified: Secondary | ICD-10-CM | POA: Diagnosis not present

## 2017-05-31 DIAGNOSIS — Z6824 Body mass index (BMI) 24.0-24.9, adult: Secondary | ICD-10-CM | POA: Diagnosis not present

## 2017-05-31 DIAGNOSIS — D472 Monoclonal gammopathy: Secondary | ICD-10-CM | POA: Diagnosis not present

## 2017-05-31 DIAGNOSIS — R590 Localized enlarged lymph nodes: Secondary | ICD-10-CM | POA: Diagnosis not present

## 2017-06-07 DIAGNOSIS — J309 Allergic rhinitis, unspecified: Secondary | ICD-10-CM | POA: Diagnosis not present

## 2017-06-13 NOTE — Progress Notes (Deleted)
Office Visit Note  Patient: Danielle Bruce             Date of Birth: 1960/06/09           MRN: [id number]             PCP: Mayra Neer, MD Referring: Mayra Neer, MD Visit Date: 06/27/2017 Occupation: @GUAROCC @    Subjective:  No chief complaint on file.   History of Present Illness: Danielle Bruce is a 57 y.o. female ***   Activities of Daily Living:  Patient reports morning stiffness for *** {minute/hour:19697}.   Patient {ACTIONS;DENIES/REPORTS:21021675::"Denies"} nocturnal pain.  Difficulty dressing/grooming: {ACTIONS;DENIES/REPORTS:21021675::"Denies"} Difficulty climbing stairs: {ACTIONS;DENIES/REPORTS:21021675::"Denies"} Difficulty getting out of chair: {ACTIONS;DENIES/REPORTS:21021675::"Denies"} Difficulty using hands for taps, buttons, cutlery, and/or writing: {ACTIONS;DENIES/REPORTS:21021675::"Denies"}   No Rheumatology ROS completed.   PMFS History:  Patient Active Problem List   Diagnosis Date Noted  . DDD (degenerative disc disease), cervical 07/14/2016  . Primary osteoarthritis of both feet 07/14/2016  . Primary osteoarthritis of both hands 07/14/2016  . Other fatigue 07/14/2016  . History of IBS 07/14/2016  . Osteopenia of multiple sites 07/14/2016  . Fibromyalgia 01/13/2016  . MGUS (monoclonal gammopathy of unknown significance) 12/23/2013  . Chronic cholecystitis without calculus 10/18/2012  . Abdominal pain, unspecified site 10/18/2012  . Nausea alone 10/18/2012  . Unspecified constipation 10/18/2012  . Depression 10/18/2012  . Bipolar 2 disorder ([place]) 10/18/2012  . Anxiety   . ASCUS (atypical squamous cells of undetermined significance) on Pap smear   . IUD   . Hemorrhoids 11/29/2010  . Abdominal pain, left upper quadrant 11/29/2010    Past Medical History:  Diagnosis Date  . Anemia   . Anxiety   . Arthritis    osteoarthritis  . ASCUS (atypical squamous cells of undetermined significance) on Pap smear 05/06/05   NEG HIGH RISK  HPV--C&B BIOPSY BENIGN 12/2005  . Asthma   . Bipolar 2 disorder ([place])   . Cancer ([place])    skin cancer - basal cell  . Colon polyps   . Complication of anesthesia    anxious afterwards, will get headaches   . Constipation   . Depression   . Fibromyalgia 10/2013  . GERD (gastroesophageal reflux disease)   . 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 Heart murmur    never had any problems  . Hemorrhoids   . High cholesterol   . 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   cytology negative  . IBS (irritable bowel syndrome)   . Insomnia   . Lymphocytic colitis   . 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 Migraines   . Osteoarthritis   . Osteopenia   . Peripheral neuropathy   . PTSD (post-traumatic stress disorder)     Family History  Problem Relation Age of Onset  . Diabetes Father   . Hypertension Father   . Hyperlipidemia Father   . Heart disease Maternal Grandfather   . Heart disease Paternal Grandmother   . Heart disease Paternal Grandfather    Past Surgical History:  Procedure Laterality Date  . BONE MARROW BIOPSY Left 12/18/2013   Plasma cell dyscrasia 8% population of plasma cells  . BREAST BIOPSY Right    benign stereo  . CESAREAN SECTION  [phone]  . CHOLECYSTECTOMY N/A 10/16/2012   Procedure: LAPAROSCOPIC CHOLECYSTECTOMY;  Surgeon: Joyice Faster. Cornett, MD;  Location: [facility];  Service: General;  Laterality: N/A;  . COLONOSCOPY  numerous times  . DILATION AND CURETTAGE OF UTERUS    . ESOPHAGOGASTRODUODENOSCOPY    . [place]   x3  . IUD REMOVAL  02/2015   Mirena  . LAPAROSCOPIC LYSIS OF ADHESIONS N/A 10/16/2012   Procedure: LAPAROSCOPIC LYSIS OF ADHESIONS;  Surgeon: Joyice Faster. Cornett, MD;  Location: [facility];  Service: General;  Laterality: N/A;  . LAPAROSCOPY N/A 10/16/2012   Procedure: LAPAROSCOPY DIAGNOSTIC;  Surgeon: Joyice Faster. Cornett, MD;  Location: [facility];  Service: General;   Laterality: N/A;  . PELVIC LAPAROSCOPY    . RADIOLOGY WITH ANESTHESIA N/A 12/16/2015   Procedure: MRI OF BRAIN WITH AND WITHOUT CONTRAST;  Surgeon: Medication Radiologist, MD;  Location: [facility];  Service: Radiology;  Laterality: N/A;  . SHOULDER SURGERY  2007/2008  . SPINE SURGERY  2010   cervical   Social History   Social History Narrative  . Not on file     Objective: Vital Signs: LMP 12/28/2010    Physical Exam   Musculoskeletal Exam: ***  CDAI Exam: No CDAI exam completed.    Investigation: No additional findings. CBC Latest Ref Rng & Units 08/01/2016 12/15/2015 07/12/2015  WBC 3.8 - 10.8 K/uL 5.9 6.8 7.0  Hemoglobin 11.7 - 15.5 g/dL 14.6 14.7 14.1  Hematocrit 35.0 - 45.0 % 43.1 42.6 41.5  Platelets 140 - 400 K/uL 254 252 248   CMP Latest Ref Rng & Units 08/01/2016 12/15/2015 07/12/2015  Glucose 65 - 99 mg/dL 95 105(H) 82  BUN 7 - 25 mg/dL 14 15 10.3  Creatinine 0.50 - 1.05 mg/dL 0.87 0.73 0.8  Sodium 135 - 146 mmol/L 142 141 144  Potassium 3.5 - 5.3 mmol/L 4.7 3.8 3.8  Chloride 98 - 110 mmol/L 107 110 -  CO2 20 - 31 mmol/L 23 23 27   Calcium 8.6 - 10.4 mg/dL 10.0 9.7 9.9  Total Protein 6.1 - 8.1 g/dL 7.4 - 7.4  Total Bilirubin 0.2 - 1.2 mg/dL 0.7 - 0.40  Alkaline Phos 33 - 130 U/L 55 - 54  AST 10 - 35 U/L 23 - 22  ALT 6 - 29 U/L 26 - 31    Imaging: No results found.  Speciality Comments: No specialty comments available.    Procedures:  No procedures performed Allergies: Sulfa antibiotics; Gluten meal; Lactose intolerance (gi); Flagyl [metronidazole hcl]; and Morphine and related   Assessment / Plan:     Visit Diagnoses: No diagnosis found.    Orders: No orders of the defined types were placed in this encounter.  No orders of the defined types were placed in this encounter.   Face-to-face time spent with patient was *** minutes. 50% of time was spent in counseling and coordination of care.  Follow-Up Instructions: No follow-ups on  file.   Earnestine Mealing, CMA  Note - This record has been created using Editor, commissioning.  Chart creation errors have been sought, but may not always  have been located. Such creation errors do not reflect on  the standard of medical care.

## 2017-06-14 DIAGNOSIS — J309 Allergic rhinitis, unspecified: Secondary | ICD-10-CM | POA: Diagnosis not present

## 2017-06-22 DIAGNOSIS — J309 Allergic rhinitis, unspecified: Secondary | ICD-10-CM | POA: Diagnosis not present

## 2017-06-26 NOTE — Progress Notes (Signed)
Office Visit Note  Patient: Danielle Bruce             Date of Birth: 1960/06/20           MRN: [id number]             PCP: Mayra Neer, MD Referring: Mayra Neer, MD Visit Date: 06/29/2017 Occupation: _0 @    Subjective:  Pain in multiple joints    History of Present Illness: [name] is a 57 y.o. female with history of fibromyalgia, osteoarthritis, and DDD. She has been having generalized muscle aches and muscle tenderness due to fibromyalgia.  She has been experiencing pain in bilateral hands, bilateral feet, bilateral SI joints, and bilateral trochanteric bursa.  She reports she is also experiencing pain in bilateral ankles.  She states that she is visiting her daughter in [place] and was walking on the beach which led to increased pain in her bilateral ankles.  She has been taking Robaxin daily.  She uses a heating pad and wears compression gloves at night.  She continues to have chronic fatigue and insomnia.  She reports she is going to get a gym membership and is going to start to exercise more regularly.   Activities of Daily Living:  Patient reports morning stiffness for 1-2 hours.   Patient Reports nocturnal pain.  Difficulty dressing/grooming: Denies Difficulty climbing stairs: Reports Difficulty getting out of chair: Reports Difficulty using hands for taps, buttons, cutlery, and/or writing: Denies   Review of Systems  Constitutional: Positive for fatigue.  HENT: Negative for mouth sores, mouth dryness and nose dryness.   Eyes: Negative for pain, visual disturbance and dryness.  Respiratory: Negative for cough, hemoptysis, shortness of breath and difficulty breathing.   Cardiovascular: Negative for chest pain, palpitations, hypertension and swelling in legs/feet.  Gastrointestinal: Positive for constipation (Hx of IBS) and diarrhea. Negative for blood in stool.  Endocrine: Negative for increased urination.  Genitourinary: Negative for painful  urination.  Musculoskeletal: Positive for arthralgias, joint pain, myalgias, morning stiffness, muscle tenderness and myalgias. Negative for joint swelling and muscle weakness.  Skin: Negative for color change, pallor, rash, hair loss, nodules/bumps, skin tightness, ulcers and sensitivity to sunlight.  Allergic/Immunologic: Negative for susceptible to infections.  Neurological: Negative for dizziness, numbness, headaches and weakness.  Hematological: Negative for swollen glands.  Psychiatric/Behavioral: Positive for depressed mood and sleep disturbance. The patient is nervous/anxious.     PMFS History:  Patient Active Problem List   Diagnosis Date Noted  . DDD (degenerative disc disease), cervical 07/14/2016  . Primary osteoarthritis of both feet 07/14/2016  . Primary osteoarthritis of both hands 07/14/2016  . Other fatigue 07/14/2016  . History of IBS 07/14/2016  . Osteopenia of multiple sites 07/14/2016  . Fibromyalgia 01/13/2016  . MGUS (monoclonal gammopathy of unknown significance) 12/23/2013  . Chronic cholecystitis without calculus 10/18/2012  . Abdominal pain, unspecified site 10/18/2012  . Nausea alone 10/18/2012  . Unspecified constipation 10/18/2012  . Depression 10/18/2012  . Bipolar 2 disorder ([place]) 10/18/2012  . Anxiety   . ASCUS (atypical squamous cells of undetermined significance) on Pap smear   . IUD   . Hemorrhoids 11/29/2010  . Abdominal pain, left upper quadrant 11/29/2010    Past Medical History:  Diagnosis Date  . Anemia   . Anxiety   . Arthritis    osteoarthritis  . ASCUS (atypical squamous cells of undetermined significance) on Pap smear 05/06/05   NEG HIGH RISK HPV--C&B BIOPSY BENIGN 12/2005  . Asthma   .  Bipolar 2 disorder ([place])   . Cancer ([place])    skin cancer - basal cell  . Colon polyps   . Complication of anesthesia    anxious afterwards, will get headaches   . Constipation   . Depression   . Fibromyalgia 10/2013  . GERD (gastroesophageal  reflux disease)   . 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 Heart murmur    never had any problems  . Hemorrhoids   . High cholesterol   . 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   cytology negative  . IBS (irritable bowel syndrome)   . Insomnia   . Lymphocytic colitis   . 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 Migraines   . Osteoarthritis   . Osteopenia   . Peripheral neuropathy   . PTSD (post-traumatic stress disorder)     Family History  Problem Relation Age of Onset  . Diabetes Father   . Hypertension Father   . Hyperlipidemia Father   . Heart disease Maternal Grandfather   . Heart disease Paternal Grandmother   . Heart disease Paternal Grandfather   . Depression Son   . Depression Son   . Anxiety disorder Son   . Depression Daughter   . Anxiety disorder Daughter   . Asthma Daughter    Past Surgical History:  Procedure Laterality Date  . BONE MARROW BIOPSY Left 12/18/2013   Plasma cell dyscrasia 8% population of plasma cells  . BREAST BIOPSY Right    benign stereo  . CESAREAN SECTION  [phone]  . CHOLECYSTECTOMY N/A 10/16/2012   Procedure: LAPAROSCOPIC CHOLECYSTECTOMY;  Surgeon: Joyice Faster. Cornett, MD;  [facility];  Service: General;  Laterality: N/A;  . COLONOSCOPY     numerous times  . DILATION AND CURETTAGE OF UTERUS    . ESOPHAGOGASTRODUODENOSCOPY    . [place]   x3  . IUD REMOVAL  02/2015   Mirena  . LAPAROSCOPIC LYSIS OF ADHESIONS N/A 10/16/2012   Procedure: LAPAROSCOPIC LYSIS OF ADHESIONS;  Surgeon: Joyice Faster. Cornett, MD;  [facility];  Service: General;  Laterality: N/A;  . LAPAROSCOPY N/A 10/16/2012   Procedure: LAPAROSCOPY DIAGNOSTIC;  Surgeon: Joyice Faster. Cornett, MD;  [facility];  Service: General;  Laterality: N/A;  . PELVIC LAPAROSCOPY    . RADIOLOGY WITH ANESTHESIA N/A 12/16/2015   Procedure: MRI OF BRAIN WITH AND WITHOUT CONTRAST;  Surgeon: Medication  Radiologist, MD;  Location: [facility];  Service: Radiology;  Laterality: N/A;  . SHOULDER SURGERY  2007/2008  . SPINE SURGERY  2010   cervical   Social History   Social History Narrative  . Not on file     Objective: Vital Signs: BP 115/71 (BP Location: Left Arm, Patient Position: Sitting, Cuff Size: Normal)   Pulse 76   Resp 14   Ht _0  (1.575 m)   Wt 130 lb (59 kg)   LMP 12/28/2010   BMI 23.78 kg/m    Physical Exam  Constitutional: She is oriented to person, place, and time. She appears well-developed and well-nourished.  HENT:  Head: Normocephalic and atraumatic.  Eyes: Conjunctivae and EOM are normal.  Neck: Normal range of motion.  Cardiovascular: Normal rate, regular rhythm, normal heart sounds and intact distal pulses.  Pulmonary/Chest: Effort normal and breath sounds normal.  Abdominal: Soft. Bowel sounds are normal.  Lymphadenopathy:    She has no cervical adenopathy.  Neurological: She is  alert and oriented to person, place, and time.  Skin: Skin is warm and dry. Capillary refill takes less than 2 seconds.  Psychiatric: She has a normal mood and affect. Her behavior is normal.  Nursing note and vitals reviewed.    Musculoskeletal Exam: C-spine, thoracic, and lumbar spine good ROM.  No midline tenderness.  Bilateral SI joint tenderness.  Tenderness of bilateral trochanteric bursa. Shoulder joints, elbow joints, wrist joints, MCPs, PIPs, and DIPs good ROM. DIP synovial thickening consistent with osteoarthritis of hands. Hip joints, knee joints, ankle joints, MTPs, PIPs, and DIPs good ROM with no synovitis.  No warmth or effusion of knee joints.   CDAI Exam: No CDAI exam completed.    Investigation: No additional findings. CBC Latest Ref Rng & Units 08/01/2016 12/15/2015 07/12/2015  WBC 3.8 - 10.8 K/uL 5.9 6.8 7.0  Hemoglobin 11.7 - 15.5 g/dL 14.6 14.7 14.1  Hematocrit 35.0 - 45.0 % 43.1 42.6 41.5  Platelets 140 - 400 K/uL 254 252 248   CMP Latest Ref Rng &  Units 08/01/2016 12/15/2015 07/12/2015  Glucose 65 - 99 mg/dL 95 105(H) 82  BUN 7 - 25 mg/dL 14 15 10.3  Creatinine 0.50 - 1.05 mg/dL 0.87 0.73 0.8  Sodium 135 - 146 mmol/L 142 141 144  Potassium 3.5 - 5.3 mmol/L 4.7 3.8 3.8  Chloride 98 - 110 mmol/L 107 110 -  CO2 20 - 31 mmol/L _0 Calcium 8.6 - 10.4 mg/dL 10.0 9.7 9.9  Total Protein 6.1 - 8.1 g/dL 7.4 - 7.4  Total Bilirubin 0.2 - 1.2 mg/dL 0.7 - 0.40  Alkaline Phos 33 - 130 U/L 55 - 54  AST 10 - 35 U/L 23 - 22  ALT 6 - 29 U/L 26 - 31    Imaging: No results found.  Speciality Comments: No specialty comments available.    Procedures:  Large Joint Inj: bilateral greater trochanter on 06/29/2017 11:38 AM Indications: pain Details: 27 G 1.5 in needle, lateral approach  Arthrogram: No  Medications (Right): 1.5 mL lidocaine 1 %; 40 mg triamcinolone acetonide 40 MG/ML Aspirate (Right): 0 mL Medications (Left): 1.5 mL lidocaine 1 %; 40 mg triamcinolone acetonide 40 MG/ML Aspirate (Left): 0 mL Outcome: tolerated well, no immediate complications Procedure, treatment alternatives, risks and benefits explained, specific risks discussed. Consent was given by the patient. Immediately prior to procedure a time out was called to verify the correct patient, procedure, equipment, support staff and site/side marked as required. Patient was prepped and draped in the usual sterile fashion.     Allergies: Sulfa antibiotics; Gluten meal; Lactose intolerance (gi); Flagyl [metronidazole hcl]; and Morphine and related   Assessment / Plan:     Visit Diagnoses: Fibromyalgia: She has generalized muscle aches and muscle tenderness due to fibromyalgia.  Generalized hyperalgesia on exam.  She is tenderness of bilateral SI joints and bilateral trochanteric bursa.  She requested a cortisone injection of the bilateral trochanteric bursa.  She tolerated the procedure well.  She continues to have chronic insomnia and fatigue.  She was given a refill of  Robaxin 500 mg twice daily PRN.  She is going to get a [employer], so we discussed exercises that she can perform.  I encouraged her to try water aerobics.    Other fatigue: Chronic  Other insomnia: She takes trazodone milligrams mg at bedtime to help with her insomnia.  Primary osteoarthritis of both hands: She has DIP synovial thickening consistent with osteoarthritis of bilateral hands.  Joint  protection and muscle strengthening were discussed.  She is a prescription for Voltaren gel.  Primary osteoarthritis of both feet: She has DIP and PIP DIP synovial thickening consistent with osteoarthritis of bilateral feet.  She wears proper fitting shoes.  She is been having discomfort in her bilateral feet and bilateral ankles.  X-rays of her ankles today were unremarkable.  DDD (degenerative disc disease), cervical: She has good range of motion on exam.  She has no discomfort in her neck at this time.  Trochanteric bursitis of both hips: She has tenderness of bilateral trochanteric bursa.  She requested a cortisone injection bilaterally.  Her blood pressure was in desirable range.  She was given a handout of trochanteric bursa exercises she can perform at home.  She was advised to monitor her blood pressure closely following the injection.    Chronic SI joint pain: She has tenderness of bilateral SI joints. She was given a handout about SI joint dysfunction.    Chronic pain of both ankles -She has discomfort in bilateral ankles especially when walking on hard surfaces.  X-rays were obtained today.  X-rays are unremarkable.  Importance of wearing proper fitting shoes was discussed.  Plan: XR Ankle 2 Views Right, XR Ankle 2 Views Left   Other medical conditions are listed as follows:  Osteopenia of multiple sites  Chronic cholecystitis without calculus  Lymphocytic colitis  History of posttraumatic stress disorder (PTSD)  Bipolar 2 disorder ([facility])  History of IBS  MGUS (monoclonal  gammopathy of unknown significance)      Orders: Orders Placed This Encounter  Procedures  . Large Joint Inj  . XR Ankle 2 Views Right  . XR Ankle 2 Views Left   Meds ordered this encounter  Medications  . methocarbamol (ROBAXIN) 500 MG tablet    Sig: Take 1 tablet (500 mg total) by mouth 2 (two) times daily as needed for muscle spasms.    Dispense:  60 tablet    Refill:  1    Face-to-face time spent with patient was 30 minutes. >50% of time was spent in counseling and coordination of care.  Follow-Up Instructions: Return in about 6 months (around 12/30/2017) for Fibromyalgia, Osteoarthritis.   Ofilia Neas, PA-C  Note - This record has been created using Dragon software.  Chart creation errors have been sought, but may not always  have been located. Such creation errors do not reflect on  the standard of medical care.

## 2017-06-27 ENCOUNTER — Ambulatory Visit: Payer: Medicare Other | Admitting: Rheumatology

## 2017-06-29 ENCOUNTER — Encounter: Payer: Self-pay | Admitting: Physician Assistant

## 2017-06-29 ENCOUNTER — Ambulatory Visit (INDEPENDENT_AMBULATORY_CARE_PROVIDER_SITE_OTHER): Payer: Medicare Other

## 2017-06-29 ENCOUNTER — Ambulatory Visit (INDEPENDENT_AMBULATORY_CARE_PROVIDER_SITE_OTHER): Payer: Self-pay

## 2017-06-29 ENCOUNTER — Ambulatory Visit (INDEPENDENT_AMBULATORY_CARE_PROVIDER_SITE_OTHER): Payer: Medicare Other | Admitting: Physician Assistant

## 2017-06-29 VITALS — BP 115/71 | HR 76 | Resp 14 | Ht 62.0 in | Wt 130.0 lb

## 2017-06-29 DIAGNOSIS — Z8719 Personal history of other diseases of the digestive system: Secondary | ICD-10-CM | POA: Diagnosis not present

## 2017-06-29 DIAGNOSIS — G4709 Other insomnia: Secondary | ICD-10-CM | POA: Diagnosis not present

## 2017-06-29 DIAGNOSIS — Z8659 Personal history of other mental and behavioral disorders: Secondary | ICD-10-CM | POA: Diagnosis not present

## 2017-06-29 DIAGNOSIS — F3181 Bipolar II disorder: Secondary | ICD-10-CM | POA: Diagnosis not present

## 2017-06-29 DIAGNOSIS — K811 Chronic cholecystitis: Secondary | ICD-10-CM | POA: Diagnosis not present

## 2017-06-29 DIAGNOSIS — G8929 Other chronic pain: Secondary | ICD-10-CM

## 2017-06-29 DIAGNOSIS — M25571 Pain in right ankle and joints of right foot: Secondary | ICD-10-CM

## 2017-06-29 DIAGNOSIS — M25572 Pain in left ankle and joints of left foot: Secondary | ICD-10-CM

## 2017-06-29 DIAGNOSIS — M19071 Primary osteoarthritis, right ankle and foot: Secondary | ICD-10-CM

## 2017-06-29 DIAGNOSIS — M7061 Trochanteric bursitis, right hip: Secondary | ICD-10-CM | POA: Diagnosis not present

## 2017-06-29 DIAGNOSIS — R5383 Other fatigue: Secondary | ICD-10-CM

## 2017-06-29 DIAGNOSIS — M7062 Trochanteric bursitis, left hip: Secondary | ICD-10-CM

## 2017-06-29 DIAGNOSIS — M797 Fibromyalgia: Secondary | ICD-10-CM

## 2017-06-29 DIAGNOSIS — M5030 Other cervical disc degeneration, unspecified cervical region: Secondary | ICD-10-CM

## 2017-06-29 DIAGNOSIS — K52832 Lymphocytic colitis: Secondary | ICD-10-CM

## 2017-06-29 DIAGNOSIS — M8589 Other specified disorders of bone density and structure, multiple sites: Secondary | ICD-10-CM | POA: Diagnosis not present

## 2017-06-29 DIAGNOSIS — M533 Sacrococcygeal disorders, not elsewhere classified: Secondary | ICD-10-CM

## 2017-06-29 DIAGNOSIS — D472 Monoclonal gammopathy: Secondary | ICD-10-CM

## 2017-06-29 DIAGNOSIS — M19041 Primary osteoarthritis, right hand: Secondary | ICD-10-CM | POA: Diagnosis not present

## 2017-06-29 DIAGNOSIS — M19042 Primary osteoarthritis, left hand: Secondary | ICD-10-CM

## 2017-06-29 DIAGNOSIS — M19072 Primary osteoarthritis, left ankle and foot: Secondary | ICD-10-CM

## 2017-06-29 MED ORDER — METHOCARBAMOL 500 MG PO TABS: 500.0000 mg | ORAL_TABLET | Freq: Two times a day (BID) | ORAL | 1 refills | Status: DC | PRN

## 2017-06-29 MED ORDER — TRIAMCINOLONE ACETONIDE 40 MG/ML IJ SUSP
40.0000 mg | INTRAMUSCULAR | Status: AC | PRN
  Administered 2017-06-29: 40 mg via INTRA_ARTICULAR

## 2017-06-29 MED ORDER — LIDOCAINE HCL 1 % IJ SOLN
1.5000 mL | INTRAMUSCULAR | Status: AC | PRN
  Administered 2017-06-29: 1.5 mL

## 2017-06-29 MED ORDER — TRIAMCINOLONE ACETONIDE 40 MG/ML IJ SUSP
40.0000 mg | INTRAMUSCULAR | Status: AC | PRN
Start: 2017-06-29 — End: 2017-06-29
  Administered 2017-06-29: 40 mg via INTRA_ARTICULAR

## 2017-06-29 NOTE — Patient Instructions (Signed)
Trochanteric Bursitis Rehab Ask your health care provider which exercises are safe for you. Do exercises exactly as told by your health care provider and adjust them as directed. It is normal to feel mild stretching, pulling, tightness, or discomfort as you do these exercises, but you should stop right away if you feel sudden pain or your pain gets worse.Do not begin these exercises until told by your health care provider. Stretching exercises These exercises warm up your muscles and joints and improve the movement and flexibility of your hip. These exercises also help to relieve pain and stiffness. Exercise A: Iliotibial band stretch  1. Lie on your side with your left / right leg in the top position. 2. Bend your left / right knee and grab your ankle. 3. Slowly bring your knee back so your thigh is behind your body. 4. Slowly lower your knee toward the floor until you feel a gentle stretch on the outside of your left / right thigh. If you do not feel a stretch and your knee will not fall farther, place the heel of your other foot on top of your outer knee and pull your thigh down farther. 5. Hold this position for __________ seconds. 6. Slowly return to the starting position. Repeat __________ times. Complete this exercise __________ times a day. Strengthening exercises These exercises build strength and endurance in your hip and pelvis. Endurance is the ability to use your muscles for a long time, even after they get tired. Exercise B: Bridge ( hip extensors) 1. Lie on your back on a firm surface with your knees bent and your feet flat on the floor. 2. Tighten your buttocks muscles and lift your buttocks off the floor until your trunk is level with your thighs. You should feel the muscles working in your buttocks and the back of your thighs. If this exercise is too easy, try doing it with your arms crossed over your chest. 3. Hold this position for __________ seconds. 4. Slowly return to the  starting position. 5. Let your muscles relax completely between repetitions. Repeat __________ times. Complete this exercise __________ times a day. Exercise C: Squats ( knee extensors and  quadriceps) 1. Stand in front of a table, with your feet and knees pointing straight ahead. You may rest your hands on the table for balance but not for support. 2. Slowly bend your knees and lower your hips like you are going to sit in a chair. ? Keep your weight over your heels, not over your toes. ? Keep your lower legs upright so they are parallel with the table legs. ? Do not let your hips go lower than your knees. ? Do not bend lower than told by your health care provider. ? If your hip pain increases, do not bend as low. 3. Hold this position for __________ seconds. 4. Slowly push with your legs to return to standing. Do not use your hands to pull yourself to standing. Repeat __________ times. Complete this exercise __________ times a day. Exercise D: Hip hike 1. Stand sideways on a bottom step. Stand on your left / right leg with your other foot unsupported next to the step. You can hold onto the railing or wall if needed for balance. 2. Keeping your knees straight and your torso square, lift your left / right hip up toward the ceiling. 3. Hold this position for __________ seconds. 4. Slowly let your left / right hip lower toward the floor, past the starting position. Your foot   should get closer to the floor. Do not lean or bend your knees. Repeat __________ times. Complete this exercise __________ times a day. Exercise E: Single leg stand 1. Stand near a counter or door frame that you can hold onto for balance as needed. It is helpful to stand in front of a mirror for this exercise so you can watch your hip. 2. Squeeze your left / right buttock muscles then lift up your other foot. Do not let your left / right hip push out to the side. 3. Hold this position for __________ seconds. Repeat  __________ times. Complete this exercise __________ times a day. This information is not intended to replace advice given to you by your health care provider. Make sure you discuss any questions you have with your health care provider. Document Released: 03/23/2004 Document Revised: 10/21/2015 Document Reviewed: 01/29/2015 Elsevier Interactive Patient Education  2018 [place]. Sacroiliac Joint Dysfunction Sacroiliac joint dysfunction is a condition that causes inflammation on one or both sides of the sacroiliac (SI) joint. The SI joint connects the lower part of the spine (sacrum) with the two upper portions of the pelvis (ilium). This condition causes deep aching or burning pain in the low back. In some cases, the pain may also spread into one or both buttocks or hips or spread down the legs. What are the causes? This condition may be caused by:  Pregnancy. During pregnancy, extra stress is put on the SI joints because the pelvis widens.  Injury, such as: ? Car accidents. ? Sport-related injuries. ? Work-related injuries.  Having one leg that is shorter than the other.  Conditions that affect the joints, such as: ? Rheumatoid arthritis. ? Gout. ? Psoriatic arthritis. ? Joint infection (septic arthritis).  Sometimes, the cause of SI joint dysfunction is not known. What are the signs or symptoms? Symptoms of this condition include:  Aching or burning pain in the lower back. The pain may also spread to other areas, such as: ? Buttocks. ? Groin. ? Thighs and legs.  Muscle spasms in or around the painful areas.  Increased pain when standing, walking, running, stair climbing, bending, or lifting.  How is this diagnosed? Your health care provider will do a physical exam and take your medical history. During the exam, the health care provider may move one or both of your legs to different positions to check for pain. Various tests may be done to help verify the diagnosis,  including:  Imaging tests to look for other causes of pain. These may include: ? MRI. ? CT scan. ? Bone scan.  Diagnostic injection. A numbing medicine is injected into the SI joint using a needle. If the pain is temporarily improved or stopped after the injection, this can indicate that SI joint dysfunction is the problem.  How is this treated? Treatment may vary depending on the cause and severity of your condition. Treatment options may include:  Applying ice or heat to the lower back area. This can help to reduce pain and muscle spasms.  Medicines to relieve pain or inflammation or to relax the muscles.  Wearing a back brace (sacroiliac brace) to help support the joint while your back is healing.  Physical therapy to increase muscle strength around the joint and flexibility at the joint. This may also involve learning proper body positions and ways of moving to relieve stress on the joint.  Direct manipulation of the SI joint.  Injections of steroid medicine into the joint in order to reduce  pain and swelling.  Radiofrequency ablation to burn away nerves that are carrying pain messages from the joint.  Use of a device that provides electrical stimulation in order to reduce pain at the joint.  Surgery to put in screws and plates that limit or prevent joint motion. This is rare.  Follow these instructions at home:  Rest as needed. Limit your activities as directed by your health care provider.  Take medicines only as directed by your health care provider.  If directed, apply ice to the affected area: ? Put ice in a plastic bag. ? Place a towel between your skin and the bag. ? Leave the ice on for 20 minutes, 2-3 times per day.  Use a heating pad or a moist heat pack as directed by your health care provider.  Exercise as directed by your health care provider or physical therapist.  Keep all follow-up visits as directed by your health care provider. This is  important. Contact a health care provider if:  Your pain is not controlled with medicine.  You have a fever.  You have increasingly severe pain. Get help right away if:  You have weakness, numbness, or tingling in your legs or feet.  You lose control of your bladder or bowel. This information is not intended to replace advice given to you by your health care provider. Make sure you discuss any questions you have with your health care provider. Document Released: 05/12/2008 Document Revised: 07/22/2015 Document Reviewed: 10/21/2013 Elsevier Interactive Patient Education  [employer].

## 2017-07-02 DIAGNOSIS — M797 Fibromyalgia: Secondary | ICD-10-CM | POA: Diagnosis not present

## 2017-07-02 DIAGNOSIS — F411 Generalized anxiety disorder: Secondary | ICD-10-CM | POA: Diagnosis not present

## 2017-07-02 DIAGNOSIS — E7800 Pure hypercholesterolemia, unspecified: Secondary | ICD-10-CM | POA: Diagnosis not present

## 2017-07-02 DIAGNOSIS — H9190 Unspecified hearing loss, unspecified ear: Secondary | ICD-10-CM | POA: Diagnosis not present

## 2017-07-02 DIAGNOSIS — G43909 Migraine, unspecified, not intractable, without status migrainosus: Secondary | ICD-10-CM | POA: Diagnosis not present

## 2017-07-02 DIAGNOSIS — K589 Irritable bowel syndrome without diarrhea: Secondary | ICD-10-CM | POA: Diagnosis not present

## 2017-07-02 DIAGNOSIS — D472 Monoclonal gammopathy: Secondary | ICD-10-CM | POA: Diagnosis not present

## 2017-07-04 DIAGNOSIS — J3089 Other allergic rhinitis: Secondary | ICD-10-CM | POA: Diagnosis not present

## 2017-07-04 DIAGNOSIS — J3081 Allergic rhinitis due to animal (cat) (dog) hair and dander: Secondary | ICD-10-CM | POA: Diagnosis not present

## 2017-07-04 DIAGNOSIS — J4530 Mild persistent asthma, uncomplicated: Secondary | ICD-10-CM | POA: Diagnosis not present

## 2017-07-04 DIAGNOSIS — J301 Allergic rhinitis due to pollen: Secondary | ICD-10-CM | POA: Diagnosis not present

## 2017-07-05 ENCOUNTER — Telehealth: Payer: Self-pay | Admitting: Internal Medicine

## 2017-07-05 NOTE — Telephone Encounter (Signed)
Faxed medical records to [facility] to [phone] Release ID# [id number]

## 2017-07-12 DIAGNOSIS — R079 Chest pain, unspecified: Secondary | ICD-10-CM | POA: Diagnosis not present

## 2017-07-12 DIAGNOSIS — G43109 Migraine with aura, not intractable, without status migrainosus: Secondary | ICD-10-CM | POA: Diagnosis not present

## 2017-07-18 ENCOUNTER — Ambulatory Visit
Admission: RE | Admit: 2017-07-18 | Discharge: 2017-07-18 | Disposition: A | Discharge status: Home / Self Care | Payer: Medicare Other | Source: Ambulatory Visit | Attending: Gynecology | Admitting: Gynecology

## 2017-07-18 DIAGNOSIS — Z139 Encounter for screening, unspecified: Secondary | ICD-10-CM

## 2017-07-18 DIAGNOSIS — Z1231 Encounter for screening mammogram for malignant neoplasm of breast: Secondary | ICD-10-CM | POA: Diagnosis not present

## 2017-07-19 DIAGNOSIS — F3113 Bipolar disorder, current episode manic without psychotic features, severe: Secondary | ICD-10-CM | POA: Diagnosis not present

## 2017-07-24 DIAGNOSIS — J3089 Other allergic rhinitis: Secondary | ICD-10-CM | POA: Diagnosis not present

## 2017-07-24 DIAGNOSIS — J3081 Allergic rhinitis due to animal (cat) (dog) hair and dander: Secondary | ICD-10-CM | POA: Diagnosis not present

## 2017-07-24 DIAGNOSIS — J301 Allergic rhinitis due to pollen: Secondary | ICD-10-CM | POA: Diagnosis not present

## 2017-08-01 DIAGNOSIS — J3089 Other allergic rhinitis: Secondary | ICD-10-CM | POA: Diagnosis not present

## 2017-08-01 DIAGNOSIS — J301 Allergic rhinitis due to pollen: Secondary | ICD-10-CM | POA: Diagnosis not present

## 2017-08-01 DIAGNOSIS — J3081 Allergic rhinitis due to animal (cat) (dog) hair and dander: Secondary | ICD-10-CM | POA: Diagnosis not present

## 2017-08-02 DIAGNOSIS — K589 Irritable bowel syndrome without diarrhea: Secondary | ICD-10-CM | POA: Diagnosis not present

## 2017-08-02 DIAGNOSIS — R21 Rash and other nonspecific skin eruption: Secondary | ICD-10-CM | POA: Diagnosis not present

## 2017-08-02 DIAGNOSIS — R238 Other skin changes: Secondary | ICD-10-CM | POA: Diagnosis not present

## 2017-08-02 DIAGNOSIS — M810 Age-related osteoporosis without current pathological fracture: Secondary | ICD-10-CM | POA: Diagnosis not present

## 2017-08-03 DIAGNOSIS — J302 Other seasonal allergic rhinitis: Secondary | ICD-10-CM | POA: Diagnosis not present

## 2017-08-03 DIAGNOSIS — J343 Hypertrophy of nasal turbinates: Secondary | ICD-10-CM | POA: Diagnosis not present

## 2017-08-03 DIAGNOSIS — H903 Sensorineural hearing loss, bilateral: Secondary | ICD-10-CM | POA: Diagnosis not present

## 2017-08-06 DIAGNOSIS — J3081 Allergic rhinitis due to animal (cat) (dog) hair and dander: Secondary | ICD-10-CM | POA: Diagnosis not present

## 2017-08-07 DIAGNOSIS — R21 Rash and other nonspecific skin eruption: Secondary | ICD-10-CM | POA: Diagnosis not present

## 2017-08-07 DIAGNOSIS — L820 Inflamed seborrheic keratosis: Secondary | ICD-10-CM | POA: Diagnosis not present

## 2017-08-07 DIAGNOSIS — Z85828 Personal history of other malignant neoplasm of skin: Secondary | ICD-10-CM | POA: Diagnosis not present

## 2017-08-07 DIAGNOSIS — D485 Neoplasm of uncertain behavior of skin: Secondary | ICD-10-CM | POA: Diagnosis not present

## 2017-08-07 DIAGNOSIS — L404 Guttate psoriasis: Secondary | ICD-10-CM | POA: Diagnosis not present

## 2017-09-06 DIAGNOSIS — J301 Allergic rhinitis due to pollen: Secondary | ICD-10-CM | POA: Diagnosis not present

## 2017-09-06 DIAGNOSIS — H9190 Unspecified hearing loss, unspecified ear: Secondary | ICD-10-CM | POA: Diagnosis not present

## 2017-09-06 DIAGNOSIS — J3089 Other allergic rhinitis: Secondary | ICD-10-CM | POA: Diagnosis not present

## 2017-09-06 DIAGNOSIS — L404 Guttate psoriasis: Secondary | ICD-10-CM | POA: Diagnosis not present

## 2017-09-06 DIAGNOSIS — K589 Irritable bowel syndrome without diarrhea: Secondary | ICD-10-CM | POA: Diagnosis not present

## 2017-09-06 DIAGNOSIS — F411 Generalized anxiety disorder: Secondary | ICD-10-CM | POA: Diagnosis not present

## 2017-09-06 DIAGNOSIS — M810 Age-related osteoporosis without current pathological fracture: Secondary | ICD-10-CM | POA: Diagnosis not present

## 2017-09-06 DIAGNOSIS — G43909 Migraine, unspecified, not intractable, without status migrainosus: Secondary | ICD-10-CM | POA: Diagnosis not present

## 2017-09-06 DIAGNOSIS — J3081 Allergic rhinitis due to animal (cat) (dog) hair and dander: Secondary | ICD-10-CM | POA: Diagnosis not present

## 2017-09-12 ENCOUNTER — Telehealth: Payer: Self-pay | Admitting: *Deleted

## 2017-09-12 NOTE — Telephone Encounter (Signed)
Patient called c/o vaginal itching/odor and discomfort, asked if Rx could be sent to pharmacy to treat. States she has migraine and not feeling well. I relayed she would need to scheduled visit with provider. She declined stating she will call back to schedule once she feels better.

## 2017-09-14 DIAGNOSIS — J3089 Other allergic rhinitis: Secondary | ICD-10-CM | POA: Diagnosis not present

## 2017-09-14 DIAGNOSIS — J3081 Allergic rhinitis due to animal (cat) (dog) hair and dander: Secondary | ICD-10-CM | POA: Diagnosis not present

## 2017-09-14 DIAGNOSIS — J301 Allergic rhinitis due to pollen: Secondary | ICD-10-CM | POA: Diagnosis not present

## 2017-09-17 DIAGNOSIS — F418 Other specified anxiety disorders: Secondary | ICD-10-CM | POA: Diagnosis not present

## 2017-09-17 DIAGNOSIS — D7589 Other specified diseases of blood and blood-forming organs: Secondary | ICD-10-CM | POA: Diagnosis not present

## 2017-09-17 DIAGNOSIS — D472 Monoclonal gammopathy: Secondary | ICD-10-CM | POA: Diagnosis not present

## 2017-09-18 ENCOUNTER — Ambulatory Visit (INDEPENDENT_AMBULATORY_CARE_PROVIDER_SITE_OTHER): Payer: Medicare Other | Admitting: Gynecology

## 2017-09-18 ENCOUNTER — Encounter: Payer: Self-pay | Admitting: Gynecology

## 2017-09-18 VITALS — BP 118/76

## 2017-09-18 DIAGNOSIS — N760 Acute vaginitis: Secondary | ICD-10-CM | POA: Diagnosis not present

## 2017-09-18 DIAGNOSIS — B9689 Other specified bacterial agents as the cause of diseases classified elsewhere: Secondary | ICD-10-CM | POA: Diagnosis not present

## 2017-09-18 DIAGNOSIS — N3000 Acute cystitis without hematuria: Secondary | ICD-10-CM

## 2017-09-18 LAB — WET PREP FOR TRICH, YEAST, CLUE

## 2017-09-18 MED ORDER — CIPROFLOXACIN HCL 250 MG PO TABS
250.0000 mg | ORAL_TABLET | Freq: Two times a day (BID) | ORAL | 0 refills | Status: DC
Start: 2017-09-18 — End: 2018-03-06

## 2017-09-18 MED ORDER — CLINDAMYCIN PHOSPHATE 2 % VA CREA: 1.0000 | TOPICAL_CREAM | Freq: Every day | VAGINAL | 0 refills | Status: DC

## 2017-09-18 NOTE — Progress Notes (Signed)
    Danielle Bruce [birth year] [id number]        56 y.o.  [id number] presents with 2 weeks of vaginal itching and discharge.  Took a Diflucan she had at home but did not help.  Having a little bit of frequency but no dysuria urgency low back pain fever or chills.  No vaginal odor.  Past medical history,surgical history, problem list, medications, allergies, family history and social history were all reviewed and documented in the EPIC chart.  Directed ROS with pertinent positives and negatives documented in the history of present illness/assessment and plan.  Exam: [name] assistant Vitals:   09/18/17 1522  BP: 118/76   General appearance:  Normal Abdomen soft nontender without masses guarding rebound Pelvic external BUS vagina with white discharge.  Cervix normal.  Uterus normal size midline mobile nontender.  Adnexa without masses or tenderness.  Assessment/Plan:  57 y.o. [id number] with history suggesting bacterial vaginosis.  Wet prep is negative.  Was treated 1 year ago for BV.  We will go ahead and cover with Cleocin vaginal cream nightly x7 nights.  Urine analysis is suspicious for UTI with moderate bacteriuria greater than 60 WBC although does have 6-10 squamous cells questionable contamination.  Will cover with ciprofloxacin 250 mg twice daily x3 days.  Follow-up if symptoms persist, worsen or recur.    Anastasio Auerbach MD, 3:45 PM 09/18/2017

## 2017-09-18 NOTE — Addendum Note (Signed)
Addended by: Nelva Nay on: 09/18/2017 04:00 PM   Modules accepted: Orders

## 2017-09-18 NOTE — Patient Instructions (Signed)
Take the ciprofloxacin antibiotic twice daily for 3 days  Use the Cleocin vaginal cream nightly for 7 nights

## 2017-09-21 LAB — URINALYSIS, COMPLETE W/RFL CULTURE
Bilirubin Urine: NEGATIVE
Glucose, UA: NEGATIVE
Hyaline Cast: NONE SEEN /LPF
Ketones, ur: NEGATIVE
Nitrites, Initial: NEGATIVE
RBC / HPF: NONE SEEN /HPF (ref 0–2)
Specific Gravity, Urine: 1.02 (ref 1.001–1.03)
WBC, UA: >=60 /HPF — AB (ref 0–5)
pH: 5.5 (ref 5.0–8.0)

## 2017-09-21 LAB — URINE CULTURE
MICRO NUMBER:: 90875550
SPECIMEN QUALITY:: ADEQUATE

## 2017-09-21 LAB — CULTURE INDICATED

## 2017-09-24 DIAGNOSIS — D472 Monoclonal gammopathy: Secondary | ICD-10-CM | POA: Diagnosis not present

## 2017-09-24 DIAGNOSIS — D4989 Neoplasm of unspecified behavior of other specified sites: Secondary | ICD-10-CM | POA: Diagnosis not present

## 2017-09-25 DIAGNOSIS — F3113 Bipolar disorder, current episode manic without psychotic features, severe: Secondary | ICD-10-CM | POA: Diagnosis not present

## 2017-09-27 DIAGNOSIS — J3081 Allergic rhinitis due to animal (cat) (dog) hair and dander: Secondary | ICD-10-CM | POA: Diagnosis not present

## 2017-09-27 DIAGNOSIS — J301 Allergic rhinitis due to pollen: Secondary | ICD-10-CM | POA: Diagnosis not present

## 2017-09-27 DIAGNOSIS — J3089 Other allergic rhinitis: Secondary | ICD-10-CM | POA: Diagnosis not present

## 2017-10-15 DIAGNOSIS — J301 Allergic rhinitis due to pollen: Secondary | ICD-10-CM | POA: Diagnosis not present

## 2017-10-15 DIAGNOSIS — G43909 Migraine, unspecified, not intractable, without status migrainosus: Secondary | ICD-10-CM | POA: Diagnosis not present

## 2017-10-15 DIAGNOSIS — K589 Irritable bowel syndrome without diarrhea: Secondary | ICD-10-CM | POA: Diagnosis not present

## 2017-10-15 DIAGNOSIS — F411 Generalized anxiety disorder: Secondary | ICD-10-CM | POA: Diagnosis not present

## 2017-10-15 DIAGNOSIS — F900 Attention-deficit hyperactivity disorder, predominantly inattentive type: Secondary | ICD-10-CM | POA: Diagnosis not present

## 2017-10-15 DIAGNOSIS — J3081 Allergic rhinitis due to animal (cat) (dog) hair and dander: Secondary | ICD-10-CM | POA: Diagnosis not present

## 2017-10-15 DIAGNOSIS — J3089 Other allergic rhinitis: Secondary | ICD-10-CM | POA: Diagnosis not present

## 2017-10-15 DIAGNOSIS — M797 Fibromyalgia: Secondary | ICD-10-CM | POA: Diagnosis not present

## 2017-10-15 DIAGNOSIS — F3181 Bipolar II disorder: Secondary | ICD-10-CM | POA: Diagnosis not present

## 2017-10-15 DIAGNOSIS — R8290 Unspecified abnormal findings in urine: Secondary | ICD-10-CM | POA: Diagnosis not present

## 2017-10-25 DIAGNOSIS — J3089 Other allergic rhinitis: Secondary | ICD-10-CM | POA: Diagnosis not present

## 2017-10-25 DIAGNOSIS — J3081 Allergic rhinitis due to animal (cat) (dog) hair and dander: Secondary | ICD-10-CM | POA: Diagnosis not present

## 2017-10-25 DIAGNOSIS — J301 Allergic rhinitis due to pollen: Secondary | ICD-10-CM | POA: Diagnosis not present

## 2017-10-30 DIAGNOSIS — J3081 Allergic rhinitis due to animal (cat) (dog) hair and dander: Secondary | ICD-10-CM | POA: Diagnosis not present

## 2017-10-30 DIAGNOSIS — J301 Allergic rhinitis due to pollen: Secondary | ICD-10-CM | POA: Diagnosis not present

## 2017-10-30 DIAGNOSIS — J3089 Other allergic rhinitis: Secondary | ICD-10-CM | POA: Diagnosis not present

## 2017-11-02 DIAGNOSIS — Z23 Encounter for immunization: Secondary | ICD-10-CM | POA: Diagnosis not present

## 2017-11-02 DIAGNOSIS — L814 Other melanin hyperpigmentation: Secondary | ICD-10-CM | POA: Diagnosis not present

## 2017-11-02 DIAGNOSIS — Z85828 Personal history of other malignant neoplasm of skin: Secondary | ICD-10-CM | POA: Diagnosis not present

## 2017-11-02 DIAGNOSIS — D485 Neoplasm of uncertain behavior of skin: Secondary | ICD-10-CM | POA: Diagnosis not present

## 2017-11-02 DIAGNOSIS — D1801 Hemangioma of skin and subcutaneous tissue: Secondary | ICD-10-CM | POA: Diagnosis not present

## 2017-11-02 DIAGNOSIS — D225 Melanocytic nevi of trunk: Secondary | ICD-10-CM | POA: Diagnosis not present

## 2017-11-02 DIAGNOSIS — L989 Disorder of the skin and subcutaneous tissue, unspecified: Secondary | ICD-10-CM | POA: Diagnosis not present

## 2017-11-02 DIAGNOSIS — F411 Generalized anxiety disorder: Secondary | ICD-10-CM | POA: Diagnosis not present

## 2017-11-02 DIAGNOSIS — K589 Irritable bowel syndrome without diarrhea: Secondary | ICD-10-CM | POA: Diagnosis not present

## 2017-11-02 DIAGNOSIS — L404 Guttate psoriasis: Secondary | ICD-10-CM | POA: Diagnosis not present

## 2017-11-02 DIAGNOSIS — M797 Fibromyalgia: Secondary | ICD-10-CM | POA: Diagnosis not present

## 2017-11-02 DIAGNOSIS — F900 Attention-deficit hyperactivity disorder, predominantly inattentive type: Secondary | ICD-10-CM | POA: Diagnosis not present

## 2017-11-02 DIAGNOSIS — L986 Other infiltrative disorders of the skin and subcutaneous tissue: Secondary | ICD-10-CM | POA: Diagnosis not present

## 2017-11-07 DIAGNOSIS — J3089 Other allergic rhinitis: Secondary | ICD-10-CM | POA: Diagnosis not present

## 2017-11-07 DIAGNOSIS — J301 Allergic rhinitis due to pollen: Secondary | ICD-10-CM | POA: Diagnosis not present

## 2017-11-07 DIAGNOSIS — J3081 Allergic rhinitis due to animal (cat) (dog) hair and dander: Secondary | ICD-10-CM | POA: Diagnosis not present

## 2017-11-13 DIAGNOSIS — F3113 Bipolar disorder, current episode manic without psychotic features, severe: Secondary | ICD-10-CM | POA: Diagnosis not present

## 2017-11-13 DIAGNOSIS — J3081 Allergic rhinitis due to animal (cat) (dog) hair and dander: Secondary | ICD-10-CM | POA: Diagnosis not present

## 2017-11-13 DIAGNOSIS — J301 Allergic rhinitis due to pollen: Secondary | ICD-10-CM | POA: Diagnosis not present

## 2017-11-13 DIAGNOSIS — J3089 Other allergic rhinitis: Secondary | ICD-10-CM | POA: Diagnosis not present

## 2017-11-27 DIAGNOSIS — J3089 Other allergic rhinitis: Secondary | ICD-10-CM | POA: Diagnosis not present

## 2017-11-27 DIAGNOSIS — L986 Other infiltrative disorders of the skin and subcutaneous tissue: Secondary | ICD-10-CM | POA: Diagnosis not present

## 2017-11-27 DIAGNOSIS — M797 Fibromyalgia: Secondary | ICD-10-CM | POA: Diagnosis not present

## 2017-11-27 DIAGNOSIS — J3081 Allergic rhinitis due to animal (cat) (dog) hair and dander: Secondary | ICD-10-CM | POA: Diagnosis not present

## 2017-11-27 DIAGNOSIS — D472 Monoclonal gammopathy: Secondary | ICD-10-CM | POA: Diagnosis not present

## 2017-11-27 DIAGNOSIS — F411 Generalized anxiety disorder: Secondary | ICD-10-CM | POA: Diagnosis not present

## 2017-11-27 DIAGNOSIS — J301 Allergic rhinitis due to pollen: Secondary | ICD-10-CM | POA: Diagnosis not present

## 2017-11-27 DIAGNOSIS — K589 Irritable bowel syndrome without diarrhea: Secondary | ICD-10-CM | POA: Diagnosis not present

## 2017-11-27 DIAGNOSIS — G43909 Migraine, unspecified, not intractable, without status migrainosus: Secondary | ICD-10-CM | POA: Diagnosis not present

## 2017-11-30 DIAGNOSIS — Z85828 Personal history of other malignant neoplasm of skin: Secondary | ICD-10-CM | POA: Diagnosis not present

## 2017-11-30 DIAGNOSIS — L986 Other infiltrative disorders of the skin and subcutaneous tissue: Secondary | ICD-10-CM | POA: Diagnosis not present

## 2017-11-30 DIAGNOSIS — D485 Neoplasm of uncertain behavior of skin: Secondary | ICD-10-CM | POA: Diagnosis not present

## 2017-12-03 DIAGNOSIS — J301 Allergic rhinitis due to pollen: Secondary | ICD-10-CM | POA: Diagnosis not present

## 2017-12-03 DIAGNOSIS — J3081 Allergic rhinitis due to animal (cat) (dog) hair and dander: Secondary | ICD-10-CM | POA: Diagnosis not present

## 2017-12-03 DIAGNOSIS — J3089 Other allergic rhinitis: Secondary | ICD-10-CM | POA: Diagnosis not present

## 2017-12-04 DIAGNOSIS — J3081 Allergic rhinitis due to animal (cat) (dog) hair and dander: Secondary | ICD-10-CM | POA: Diagnosis not present

## 2017-12-04 DIAGNOSIS — J3089 Other allergic rhinitis: Secondary | ICD-10-CM | POA: Diagnosis not present

## 2017-12-04 DIAGNOSIS — J301 Allergic rhinitis due to pollen: Secondary | ICD-10-CM | POA: Diagnosis not present

## 2017-12-17 NOTE — Progress Notes (Deleted)
Office Visit Note  Patient: Danielle Bruce             Date of Birth: [birth year]           MRN: [id number]             PCP: Mayra Neer, MD Referring: Mayra Neer, MD Visit Date: 12/31/2017 Occupation: @GUAROCC @  Subjective:  No chief complaint on file.   History of Present Illness: Danielle Bruce is a 57 y.o. female ***   Activities of Daily Living:  Patient reports morning stiffness for *** {minute/hour:19697}.   Patient {ACTIONS;DENIES/REPORTS:21021675::"Denies"} nocturnal pain.  Difficulty dressing/grooming: {ACTIONS;DENIES/REPORTS:21021675::"Denies"} Difficulty climbing stairs: {ACTIONS;DENIES/REPORTS:21021675::"Denies"} Difficulty getting out of chair: {ACTIONS;DENIES/REPORTS:21021675::"Denies"} Difficulty using hands for taps, buttons, cutlery, and/or writing: {ACTIONS;DENIES/REPORTS:21021675::"Denies"}  No Rheumatology ROS completed.   PMFS History:  Patient Active Problem List   Diagnosis Date Noted  . DDD (degenerative disc disease), cervical 07/14/2016  . Primary osteoarthritis of both feet 07/14/2016  . Primary osteoarthritis of both hands 07/14/2016  . Other fatigue 07/14/2016  . History of IBS 07/14/2016  . Osteopenia of multiple sites 07/14/2016  . Fibromyalgia 01/13/2016  . MGUS (monoclonal gammopathy of unknown significance) 12/23/2013  . Chronic cholecystitis without calculus 10/18/2012  . Abdominal pain, unspecified site 10/18/2012  . Nausea alone 10/18/2012  . Unspecified constipation 10/18/2012  . Depression 10/18/2012  . Bipolar 2 disorder ([place]) 10/18/2012  . Anxiety   . ASCUS (atypical squamous cells of undetermined significance) on Pap smear   . IUD   . Hemorrhoids 11/29/2010  . Abdominal pain, left upper quadrant 11/29/2010    Past Medical History:  Diagnosis Date  . Anemia   . Anxiety   . Arthritis    osteoarthritis  . ASCUS (atypical squamous cells of undetermined significance) on Pap smear 05/06/05   NEG HIGH RISK HPV--C&B  BIOPSY BENIGN 12/2005  . Asthma   . Bipolar 2 disorder ([place])   . Cancer ([place])    skin cancer - basal cell  . Colon polyps   . Complication of anesthesia    anxious afterwards, will get headaches   . Constipation   . Depression   . Fibromyalgia 10/2013  . GERD (gastroesophageal reflux disease)   . 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 Heart murmur    never had any problems  . Hemorrhoids   . High cholesterol   . 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   cytology negative  . IBS (irritable bowel syndrome)   . Insomnia   . Lymphocytic colitis   . 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 Migraines   . Osteoarthritis   . Osteopenia   . Peripheral neuropathy   . PTSD (post-traumatic stress disorder)     Family History  Problem Relation Age of Onset  . Diabetes Father   . Hypertension Father   . Hyperlipidemia Father   . Heart disease Maternal Grandfather   . Heart disease Paternal Grandmother   . Heart disease Paternal Grandfather   . Depression Son   . Depression Son   . Anxiety disorder Son   . Depression Daughter   . Anxiety disorder Daughter   . Asthma Daughter    Past Surgical History:  Procedure Laterality Date  . BONE MARROW BIOPSY Left 12/18/2013   Plasma cell dyscrasia 8% population of plasma cells  . BREAST BIOPSY Right    benign stereo  . CESAREAN SECTION  [phone]  .  CHOLECYSTECTOMY N/A 10/16/2012   Procedure: LAPAROSCOPIC CHOLECYSTECTOMY;  Surgeon: Joyice Faster. Cornett, MD;  Location: [facility];  Service: General;  Laterality: N/A;  . COLONOSCOPY     numerous times  . DILATION AND CURETTAGE OF UTERUS    . ESOPHAGOGASTRODUODENOSCOPY    . Peridot   x3  . IUD REMOVAL  02/2015   Mirena  . LAPAROSCOPIC LYSIS OF ADHESIONS N/A 10/16/2012   Procedure: LAPAROSCOPIC LYSIS OF ADHESIONS;  Surgeon: Joyice Faster. Cornett, MD;  Location: [facility];  Service: General;  Laterality: N/A;  . LAPAROSCOPY  N/A 10/16/2012   Procedure: LAPAROSCOPY DIAGNOSTIC;  Surgeon: Joyice Faster. Cornett, MD;  Location: [facility];  Service: General;  Laterality: N/A;  . PELVIC LAPAROSCOPY    . RADIOLOGY WITH ANESTHESIA N/A 12/16/2015   Procedure: MRI OF BRAIN WITH AND WITHOUT CONTRAST;  Surgeon: Medication Radiologist, MD;  Location: [facility];  Service: Radiology;  Laterality: N/A;  . SHOULDER SURGERY  2007/2008  . SPINE SURGERY  2010   cervical   Social History   Social History Narrative  . Not on file    Objective: Vital Signs: LMP 12/28/2010    Physical Exam   Musculoskeletal Exam: ***  CDAI Exam: CDAI Score: Not documented Patient Global Assessment: Not documented; Provider Global Assessment: Not documented Swollen: Not documented; Tender: Not documented Joint Exam   Not documented   There is currently no information documented on the homunculus. Go to the Rheumatology activity and complete the homunculus joint exam.  Investigation: No additional findings.  Imaging: No results found.  Recent Labs: Lab Results  Component Value Date   WBC 5.9 08/01/2016   HGB 14.6 08/01/2016   PLT 254 08/01/2016   NA 142 08/01/2016   K 4.7 08/01/2016   CL 107 08/01/2016   CO2 23 08/01/2016   GLUCOSE 95 08/01/2016   BUN 14 08/01/2016   CREATININE 0.87 08/01/2016   BILITOT 0.7 08/01/2016   ALKPHOS 55 08/01/2016   AST 23 08/01/2016   ALT 26 08/01/2016   PROT 7.4 08/01/2016   ALBUMIN 4.8 08/01/2016   CALCIUM 10.0 08/01/2016   GFRAA 88 08/01/2016    Speciality Comments: No specialty comments available.  Procedures:  No procedures performed Allergies: Sulfa antibiotics; Gluten meal; Lactose intolerance (gi); Flagyl [metronidazole hcl]; and Morphine and related   Assessment / Plan:     Visit Diagnoses: No diagnosis found.   Orders: No orders of the defined types were placed in this encounter.  No orders of the defined types were placed in this encounter.   Face-to-face time spent with  patient was *** minutes. Greater than 50% of time was spent in counseling and coordination of care.  Follow-Up Instructions: No follow-ups on file.   Earnestine Mealing, CMA  Note - This record has been created using Editor, commissioning.  Chart creation errors have been sought, but may not always  have been located. Such creation errors do not reflect on  the standard of medical care.

## 2017-12-21 ENCOUNTER — Other Ambulatory Visit: Payer: Self-pay | Admitting: Family Medicine

## 2017-12-21 ENCOUNTER — Ambulatory Visit
Admission: RE | Admit: 2017-12-21 | Discharge: 2017-12-21 | Disposition: A | Discharge status: Home / Self Care | Payer: Medicare Other | Source: Ambulatory Visit | Attending: Family Medicine | Admitting: Family Medicine

## 2017-12-21 DIAGNOSIS — R1084 Generalized abdominal pain: Secondary | ICD-10-CM

## 2017-12-21 DIAGNOSIS — J301 Allergic rhinitis due to pollen: Secondary | ICD-10-CM | POA: Diagnosis not present

## 2017-12-21 DIAGNOSIS — J3081 Allergic rhinitis due to animal (cat) (dog) hair and dander: Secondary | ICD-10-CM | POA: Diagnosis not present

## 2017-12-21 DIAGNOSIS — J3089 Other allergic rhinitis: Secondary | ICD-10-CM | POA: Diagnosis not present

## 2017-12-21 DIAGNOSIS — R110 Nausea: Secondary | ICD-10-CM | POA: Diagnosis not present

## 2017-12-21 DIAGNOSIS — R109 Unspecified abdominal pain: Secondary | ICD-10-CM | POA: Diagnosis not present

## 2017-12-21 MED ORDER — IOPAMIDOL (ISOVUE-300) INJECTION 61%
100.0000 mL | Freq: Once | INTRAVENOUS | Status: AC | PRN
  Administered 2017-12-21: 100 mL via INTRAVENOUS

## 2017-12-26 ENCOUNTER — Encounter: Payer: Self-pay | Admitting: Emergency Medicine

## 2017-12-26 DIAGNOSIS — F4310 Post-traumatic stress disorder, unspecified: Secondary | ICD-10-CM | POA: Insufficient documentation

## 2017-12-26 DIAGNOSIS — F411 Generalized anxiety disorder: Secondary | ICD-10-CM

## 2017-12-26 DIAGNOSIS — F429 Obsessive-compulsive disorder, unspecified: Secondary | ICD-10-CM

## 2017-12-27 DIAGNOSIS — R198 Other specified symptoms and signs involving the digestive system and abdomen: Secondary | ICD-10-CM | POA: Diagnosis not present

## 2017-12-27 DIAGNOSIS — K219 Gastro-esophageal reflux disease without esophagitis: Secondary | ICD-10-CM | POA: Diagnosis not present

## 2017-12-27 DIAGNOSIS — K591 Functional diarrhea: Secondary | ICD-10-CM | POA: Diagnosis not present

## 2017-12-27 DIAGNOSIS — R1084 Generalized abdominal pain: Secondary | ICD-10-CM | POA: Diagnosis not present

## 2017-12-28 DIAGNOSIS — J3081 Allergic rhinitis due to animal (cat) (dog) hair and dander: Secondary | ICD-10-CM | POA: Diagnosis not present

## 2017-12-28 DIAGNOSIS — J3089 Other allergic rhinitis: Secondary | ICD-10-CM | POA: Diagnosis not present

## 2017-12-28 DIAGNOSIS — J301 Allergic rhinitis due to pollen: Secondary | ICD-10-CM | POA: Diagnosis not present

## 2017-12-31 ENCOUNTER — Ambulatory Visit: Payer: Medicare Other | Admitting: Rheumatology

## 2017-12-31 DIAGNOSIS — J3089 Other allergic rhinitis: Secondary | ICD-10-CM | POA: Diagnosis not present

## 2017-12-31 DIAGNOSIS — J301 Allergic rhinitis due to pollen: Secondary | ICD-10-CM | POA: Diagnosis not present

## 2017-12-31 DIAGNOSIS — J3081 Allergic rhinitis due to animal (cat) (dog) hair and dander: Secondary | ICD-10-CM | POA: Diagnosis not present

## 2018-01-09 ENCOUNTER — Ambulatory Visit: Payer: Self-pay | Admitting: Psychiatry

## 2018-01-11 DIAGNOSIS — J3089 Other allergic rhinitis: Secondary | ICD-10-CM | POA: Diagnosis not present

## 2018-01-11 DIAGNOSIS — J3081 Allergic rhinitis due to animal (cat) (dog) hair and dander: Secondary | ICD-10-CM | POA: Diagnosis not present

## 2018-01-11 DIAGNOSIS — J301 Allergic rhinitis due to pollen: Secondary | ICD-10-CM | POA: Diagnosis not present

## 2018-01-14 ENCOUNTER — Other Ambulatory Visit: Payer: Self-pay | Admitting: Family Medicine

## 2018-01-14 ENCOUNTER — Ambulatory Visit
Admission: RE | Admit: 2018-01-14 | Discharge: 2018-01-14 | Disposition: A | Discharge status: Home / Self Care | Payer: Medicare Other | Source: Ambulatory Visit | Attending: Family Medicine | Admitting: Family Medicine

## 2018-01-14 DIAGNOSIS — E7800 Pure hypercholesterolemia, unspecified: Secondary | ICD-10-CM | POA: Diagnosis not present

## 2018-01-14 DIAGNOSIS — K582 Mixed irritable bowel syndrome: Secondary | ICD-10-CM | POA: Diagnosis not present

## 2018-01-14 DIAGNOSIS — F4310 Post-traumatic stress disorder, unspecified: Secondary | ICD-10-CM | POA: Diagnosis not present

## 2018-01-14 DIAGNOSIS — D508 Other iron deficiency anemias: Secondary | ICD-10-CM | POA: Diagnosis not present

## 2018-01-14 DIAGNOSIS — F900 Attention-deficit hyperactivity disorder, predominantly inattentive type: Secondary | ICD-10-CM | POA: Diagnosis not present

## 2018-01-14 DIAGNOSIS — D472 Monoclonal gammopathy: Secondary | ICD-10-CM | POA: Diagnosis not present

## 2018-01-14 DIAGNOSIS — J301 Allergic rhinitis due to pollen: Secondary | ICD-10-CM | POA: Diagnosis not present

## 2018-01-14 DIAGNOSIS — M810 Age-related osteoporosis without current pathological fracture: Secondary | ICD-10-CM | POA: Diagnosis not present

## 2018-01-14 DIAGNOSIS — R0989 Other specified symptoms and signs involving the circulatory and respiratory systems: Secondary | ICD-10-CM | POA: Diagnosis not present

## 2018-01-14 DIAGNOSIS — J45909 Unspecified asthma, uncomplicated: Secondary | ICD-10-CM | POA: Diagnosis not present

## 2018-01-14 DIAGNOSIS — Z0000 Encounter for general adult medical examination without abnormal findings: Secondary | ICD-10-CM | POA: Diagnosis not present

## 2018-01-14 DIAGNOSIS — K219 Gastro-esophageal reflux disease without esophagitis: Secondary | ICD-10-CM | POA: Diagnosis not present

## 2018-01-14 DIAGNOSIS — J3089 Other allergic rhinitis: Secondary | ICD-10-CM | POA: Diagnosis not present

## 2018-01-14 DIAGNOSIS — J3081 Allergic rhinitis due to animal (cat) (dog) hair and dander: Secondary | ICD-10-CM | POA: Diagnosis not present

## 2018-01-14 DIAGNOSIS — F3181 Bipolar II disorder: Secondary | ICD-10-CM | POA: Diagnosis not present

## 2018-01-14 DIAGNOSIS — F411 Generalized anxiety disorder: Secondary | ICD-10-CM | POA: Diagnosis not present

## 2018-01-15 NOTE — Progress Notes (Deleted)
Office Visit Note  Patient: Danielle Bruce             Date of Birth: Jun 09, 1960           MRN: [id number]             PCP: Mayra Neer, MD Referring: Mayra Neer, MD Visit Date: 01/29/2018 Occupation: @GUAROCC @  Subjective:  No chief complaint on file.   History of Present Illness: Danielle Bruce is a 57 y.o. female ***   Activities of Daily Living:  Patient reports morning stiffness for *** {minute/hour:19697}.   Patient {ACTIONS;DENIES/REPORTS:21021675::"Denies"} nocturnal pain.  Difficulty dressing/grooming: {ACTIONS;DENIES/REPORTS:21021675::"Denies"} Difficulty climbing stairs: {ACTIONS;DENIES/REPORTS:21021675::"Denies"} Difficulty getting out of chair: {ACTIONS;DENIES/REPORTS:21021675::"Denies"} Difficulty using hands for taps, buttons, cutlery, and/or writing: {ACTIONS;DENIES/REPORTS:21021675::"Denies"}  No Rheumatology ROS completed.   PMFS History:  Patient Active Problem List   Diagnosis Date Noted  . GAD (generalized anxiety disorder) 12/26/2017  . OCD (obsessive compulsive disorder) 12/26/2017  . PTSD (post-traumatic stress disorder) 12/26/2017  . DDD (degenerative disc disease), cervical 07/14/2016  . Primary osteoarthritis of both feet 07/14/2016  . Primary osteoarthritis of both hands 07/14/2016  . Other fatigue 07/14/2016  . History of IBS 07/14/2016  . Osteopenia of multiple sites 07/14/2016  . Fibromyalgia 01/13/2016  . MGUS (monoclonal gammopathy of unknown significance) 12/23/2013  . Chronic cholecystitis without calculus 10/18/2012  . Abdominal pain, unspecified site 10/18/2012  . Nausea alone 10/18/2012  . Unspecified constipation 10/18/2012  . Depression 10/18/2012  . Bipolar 2 disorder ([place]) 10/18/2012  . Anxiety   . ASCUS (atypical squamous cells of undetermined significance) on Pap smear   . IUD   . Hemorrhoids 11/29/2010  . Abdominal pain, left upper quadrant 11/29/2010    Past Medical History:  Diagnosis Date  . Anemia   .  Anxiety   . Arthritis    osteoarthritis  . ASCUS (atypical squamous cells of undetermined significance) on Pap smear 05/06/05   NEG HIGH RISK HPV--C&B BIOPSY BENIGN 12/2005  . Asthma   . Bipolar 2 disorder ([place])   . Cancer ([place])    skin cancer - basal cell  . Colon polyps   . Complication of anesthesia    anxious afterwards, will get headaches   . Constipation   . Depression   . Fibromyalgia 10/2013  . GERD (gastroesophageal reflux disease)   . 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 Heart murmur    never had any problems  . Hemorrhoids   . High cholesterol   . 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   cytology negative  . IBS (irritable bowel syndrome)   . Insomnia   . Lymphocytic colitis   . 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 Migraines   . Osteoarthritis   . Osteopenia   . Peripheral neuropathy   . PTSD (post-traumatic stress disorder)     Family History  Problem Relation Age of Onset  . Diabetes Father   . Hypertension Father   . Hyperlipidemia Father   . Heart disease Maternal Grandfather   . Heart disease Paternal Grandmother   . Heart disease Paternal Grandfather   . Depression Son   . Depression Son   . Anxiety disorder Son   . Depression Daughter   . Anxiety disorder Daughter   . Asthma Daughter    Past Surgical History:  Procedure Laterality Date  . BONE MARROW BIOPSY Left 12/18/2013   Plasma cell dyscrasia 8%  population of plasma cells  . BREAST BIOPSY Right    benign stereo  . CESAREAN SECTION  [id number]  . CHOLECYSTECTOMY N/A 10/16/2012   Procedure: LAPAROSCOPIC CHOLECYSTECTOMY;  Surgeon: Joyice Faster. Cornett, MD;  Location: [facility];  Service: General;  Laterality: N/A;  . COLONOSCOPY     numerous times  . DILATION AND CURETTAGE OF UTERUS    . ESOPHAGOGASTRODUODENOSCOPY    . [place]   x3  . IUD REMOVAL  02/2015   Mirena  . LAPAROSCOPIC LYSIS OF ADHESIONS N/A  10/16/2012   Procedure: LAPAROSCOPIC LYSIS OF ADHESIONS;  Surgeon: Joyice Faster. Cornett, MD;  Location: [facility];  Service: General;  Laterality: N/A;  . LAPAROSCOPY N/A 10/16/2012   Procedure: LAPAROSCOPY DIAGNOSTIC;  Surgeon: Joyice Faster. Cornett, MD;  Location: [facility];  Service: General;  Laterality: N/A;  . PELVIC LAPAROSCOPY    . RADIOLOGY WITH ANESTHESIA N/A 12/16/2015   Procedure: MRI OF BRAIN WITH AND WITHOUT CONTRAST;  Surgeon: Medication Radiologist, MD;  Location: [facility];  Service: Radiology;  Laterality: N/A;  . SHOULDER SURGERY  2007/2008  . SPINE SURGERY  2010   cervical   Social History   Social History Narrative  . Not on file    Objective: Vital Signs: LMP 12/28/2010    Physical Exam   Musculoskeletal Exam: ***  CDAI Exam: CDAI Score: Not documented Patient Global Assessment: Not documented; Provider Global Assessment: Not documented Swollen: Not documented; Tender: Not documented Joint Exam   Not documented   There is currently no information documented on the homunculus. Go to the Rheumatology activity and complete the homunculus joint exam.  Investigation: No additional findings.  Imaging: Dg Chest 2 View  Result Date: 01/14/2018 CLINICAL DATA:  Right lower lung crackles. EXAM: CHEST - 2 VIEW COMPARISON:  06/24/2014 FINDINGS: The heart size and mediastinal contours are within normal limits. Both lungs are clear. The visualized skeletal structures are unremarkable. IMPRESSION: No active cardiopulmonary disease. Electronically Signed   By: Franchot Gallo M.D.   On: 01/14/2018 14:05   Ct Abdomen Pelvis W Contrast  Result Date: 12/21/2017 CLINICAL DATA:  57 year old with generalized abdominal pain. Possible hematochezia. EXAM: CT ABDOMEN AND PELVIS WITH CONTRAST TECHNIQUE: Multidetector CT imaging of the abdomen and pelvis was performed using the standard protocol following bolus administration of intravenous contrast. CONTRAST:  193m ISOVUE-300 IOPAMIDOL  (ISOVUE-300) INJECTION 61% COMPARISON:  CT 10/11/2012 FINDINGS: Lower chest: Lung bases are clear. Small calcification in the right lower lobe on sequence 3, image 21. No large pleural effusions. Hepatobiliary: Cholecystectomy. Portal venous system and hepatic venous system are patent. There is a punctate low-density structure in the right hepatic lobe on sequence 2, image 15 which is too small to definitively characterize. No suspicious liver lesions. No biliary dilatation. Pancreas: Unremarkable. No pancreatic ductal dilatation or surrounding inflammatory changes. Spleen: Normal in size without focal abnormality. Adrenals/Urinary Tract: Normal adrenal glands. Urinary bladder is unremarkable. Tiny low-density structures in the kidneys are most compatible with small cysts. No hydronephrosis. Stomach/Bowel: Stomach is within normal limits. Appendix appears normal. No evidence of bowel wall thickening, distention, or inflammatory changes. Vascular/Lymphatic: Normal appearance of the arterial structures. Retroaortic left renal vein which is a normal variant. No lymph node enlargement in the abdomen or pelvis. Reproductive: Uterus is retroverted or retroflexed. No evidence for an ovarian or adnexal lesion. Other: Tiny umbilical hernia containing fat. No free fluid. Negative for free air. Musculoskeletal: Negative IMPRESSION: No acute abnormality in the abdomen or pelvis. Electronically  Signed   By: Markus Daft M.D.   On: 12/21/2017 17:31    Recent Labs: Lab Results  Component Value Date   WBC 5.9 08/01/2016   HGB 14.6 08/01/2016   PLT 254 08/01/2016   NA 142 08/01/2016   K 4.7 08/01/2016   CL 107 08/01/2016   CO2 23 08/01/2016   GLUCOSE 95 08/01/2016   BUN 14 08/01/2016   CREATININE 0.87 08/01/2016   BILITOT 0.7 08/01/2016   ALKPHOS 55 08/01/2016   AST 23 08/01/2016   ALT 26 08/01/2016   PROT 7.4 08/01/2016   ALBUMIN 4.8 08/01/2016   CALCIUM 10.0 08/01/2016   GFRAA 88 08/01/2016    Speciality  Comments: No specialty comments available.  Procedures:  No procedures performed Allergies: Sulfa antibiotics; Gluten meal; Lactose intolerance (gi); Flagyl [metronidazole hcl]; and Morphine and related   Assessment / Plan:     Visit Diagnoses: Fibromyalgia  Other fatigue  Other insomnia  Primary osteoarthritis of both hands  Primary osteoarthritis of both feet  Trochanteric bursitis of both hips  Chronic SI joint pain  Osteopenia of multiple sites  DDD (degenerative disc disease), cervical  Chronic cholecystitis without calculus  Lymphocytic colitis  History of posttraumatic stress disorder (PTSD)  Bipolar 2 disorder ([facility])  History of IBS  MGUS (monoclonal gammopathy of unknown significance)   Orders: No orders of the defined types were placed in this encounter.  No orders of the defined types were placed in this encounter.   Face-to-face time spent with patient was *** minutes. Greater than 50% of time was spent in counseling and coordination of care.  Follow-Up Instructions: No follow-ups on file.   Ofilia Neas, PA-C  Note - This record has been created using Dragon software.  Chart creation errors have been sought, but may not always  have been located. Such creation errors do not reflect on  the standard of medical care.

## 2018-01-23 DIAGNOSIS — J028 Acute pharyngitis due to other specified organisms: Secondary | ICD-10-CM | POA: Diagnosis not present

## 2018-01-28 DIAGNOSIS — J301 Allergic rhinitis due to pollen: Secondary | ICD-10-CM | POA: Diagnosis not present

## 2018-01-28 DIAGNOSIS — J3081 Allergic rhinitis due to animal (cat) (dog) hair and dander: Secondary | ICD-10-CM | POA: Diagnosis not present

## 2018-01-28 DIAGNOSIS — J3089 Other allergic rhinitis: Secondary | ICD-10-CM | POA: Diagnosis not present

## 2018-01-29 ENCOUNTER — Ambulatory Visit: Payer: Medicare Other | Admitting: Physician Assistant

## 2018-01-31 ENCOUNTER — Encounter: Payer: Medicare Other | Attending: Family Medicine | Admitting: Registered"

## 2018-01-31 ENCOUNTER — Encounter: Payer: Self-pay | Admitting: Registered"

## 2018-01-31 DIAGNOSIS — E639 Nutritional deficiency, unspecified: Secondary | ICD-10-CM

## 2018-01-31 DIAGNOSIS — E7800 Pure hypercholesterolemia, unspecified: Secondary | ICD-10-CM | POA: Diagnosis not present

## 2018-01-31 DIAGNOSIS — Z713 Dietary counseling and surveillance: Secondary | ICD-10-CM | POA: Insufficient documentation
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
## 2018-01-31 NOTE — Patient Instructions (Addendum)
-   Get reconnected with therapist. See handout.   - Add protein to breakfast such as eggs or toast + peanut butter or greek yogurt.   - Try tempeh.

## 2018-02-15 DIAGNOSIS — F900 Attention-deficit hyperactivity disorder, predominantly inattentive type: Secondary | ICD-10-CM | POA: Diagnosis not present

## 2018-02-15 DIAGNOSIS — D472 Monoclonal gammopathy: Secondary | ICD-10-CM | POA: Diagnosis not present

## 2018-02-15 DIAGNOSIS — E7800 Pure hypercholesterolemia, unspecified: Secondary | ICD-10-CM | POA: Diagnosis not present

## 2018-02-15 DIAGNOSIS — K589 Irritable bowel syndrome without diarrhea: Secondary | ICD-10-CM | POA: Diagnosis not present

## 2018-02-15 DIAGNOSIS — G43909 Migraine, unspecified, not intractable, without status migrainosus: Secondary | ICD-10-CM | POA: Diagnosis not present

## 2018-02-15 DIAGNOSIS — F3181 Bipolar II disorder: Secondary | ICD-10-CM | POA: Diagnosis not present

## 2018-02-15 DIAGNOSIS — M797 Fibromyalgia: Secondary | ICD-10-CM | POA: Diagnosis not present

## 2018-02-15 DIAGNOSIS — K5289 Other specified noninfective gastroenteritis and colitis: Secondary | ICD-10-CM | POA: Diagnosis not present

## 2018-02-22 DIAGNOSIS — J3081 Allergic rhinitis due to animal (cat) (dog) hair and dander: Secondary | ICD-10-CM | POA: Diagnosis not present

## 2018-02-22 DIAGNOSIS — J301 Allergic rhinitis due to pollen: Secondary | ICD-10-CM | POA: Diagnosis not present

## 2018-02-22 DIAGNOSIS — J3089 Other allergic rhinitis: Secondary | ICD-10-CM | POA: Diagnosis not present

## 2018-02-25 DIAGNOSIS — J3089 Other allergic rhinitis: Secondary | ICD-10-CM | POA: Diagnosis not present

## 2018-02-25 DIAGNOSIS — J3081 Allergic rhinitis due to animal (cat) (dog) hair and dander: Secondary | ICD-10-CM | POA: Diagnosis not present

## 2018-02-25 DIAGNOSIS — J301 Allergic rhinitis due to pollen: Secondary | ICD-10-CM | POA: Diagnosis not present

## 2018-03-05 DIAGNOSIS — Z79899 Other long term (current) drug therapy: Secondary | ICD-10-CM | POA: Diagnosis not present

## 2018-03-05 DIAGNOSIS — C8409 Mycosis fungoides, extranodal and solid organ sites: Secondary | ICD-10-CM | POA: Diagnosis not present

## 2018-03-05 DIAGNOSIS — Z85828 Personal history of other malignant neoplasm of skin: Secondary | ICD-10-CM | POA: Diagnosis not present

## 2018-03-05 DIAGNOSIS — Z720 Tobacco use: Secondary | ICD-10-CM | POA: Diagnosis not present

## 2018-03-05 DIAGNOSIS — L986 Other infiltrative disorders of the skin and subcutaneous tissue: Secondary | ICD-10-CM | POA: Diagnosis not present

## 2018-03-05 DIAGNOSIS — D472 Monoclonal gammopathy: Secondary | ICD-10-CM | POA: Diagnosis not present

## 2018-03-05 DIAGNOSIS — C8400 Mycosis fungoides, unspecified site: Secondary | ICD-10-CM | POA: Diagnosis not present

## 2018-03-06 ENCOUNTER — Encounter: Payer: Self-pay | Admitting: Gynecology

## 2018-03-06 ENCOUNTER — Ambulatory Visit (INDEPENDENT_AMBULATORY_CARE_PROVIDER_SITE_OTHER): Payer: Medicare Other | Admitting: Gynecology

## 2018-03-06 VITALS — BP 122/76 | Ht 62.0 in | Wt 138.0 lb

## 2018-03-06 DIAGNOSIS — Z9289 Personal history of other medical treatment: Secondary | ICD-10-CM

## 2018-03-06 DIAGNOSIS — N952 Postmenopausal atrophic vaginitis: Secondary | ICD-10-CM

## 2018-03-06 DIAGNOSIS — Z01419 Encounter for gynecological examination (general) (routine) without abnormal findings: Secondary | ICD-10-CM | POA: Diagnosis not present

## 2018-03-06 DIAGNOSIS — M810 Age-related osteoporosis without current pathological fracture: Secondary | ICD-10-CM

## 2018-03-06 NOTE — Patient Instructions (Signed)
Follow-up in 1 year for annual exam, sooner if any issues. 

## 2018-03-06 NOTE — Progress Notes (Signed)
    Danielle Bruce 04-23-1960 [id number]        58 y.o.  [id number] for breast and pelvic exam.  Without gynecologic complaints  Past medical history,surgical history, problem list, medications, allergies, family history and social history were all reviewed and documented as reviewed in the EPIC chart.  ROS:  Performed with pertinent positives and negatives included in the history, assessment and plan.   Additional significant findings : None   Exam: [name] assistant Vitals:   03/06/18 1421  BP: 122/76  Weight: 138 lb (62.6 kg)  Height: 5\' 2"  (1.575 m)   Body mass index is 25.24 kg/m.  General appearance:  Normal affect, orientation and appearance. Skin: Grossly normal HEENT: Without gross lesions.  No cervical or supraclavicular adenopathy. Thyroid normal.  Lungs:  Clear without wheezing, rales or rhonchi Cardiac: RR, without RMG Abdominal:  Soft, nontender, without masses, guarding, rebound, organomegaly or hernia Breasts:  Examined lying and sitting without masses, retractions, discharge or axillary adenopathy. Pelvic:  Ext, BUS, Vagina: With atrophic changes   Cervix: With atrophic changes  Uterus: Anteverted, normal size, shape and contour, midline and mobile nontender   Adnexa: Without masses or tenderness    Anus and perineum: Normal   Rectovaginal: Normal sphincter tone without palpated masses or tenderness.    Assessment/Plan:  58 y.o. [id number] female for annual gynecologic exam.   1. Postmenopausal.  No significant menopausal symptoms or any vaginal bleeding. 2. Osteoporosis.  Followed through her primary physician's office.  Is going to initiate Prolia.  Will continue to follow-up with them in reference to this 3. Mammography 06/2017.  Continue with annual mammography when due.  Breast exam normal today. 4. Pap smear 2019.  No Pap smear done today.  History of positive HPV with normal cytology 2013 with normal Pap smears and negative HPV 2014 and 2015.  Plan  repeat Pap smear at 3-year interval. 5. Colonoscopy 2016.  Repeat at their recommended interval. 6. Health maintenance.  No routine lab work done as patient does this elsewhere.  Follow-up 1 year, sooner as needed.   Anastasio Auerbach MD, 2:50 PM 03/06/2018

## 2018-03-07 ENCOUNTER — Ambulatory Visit: Payer: Medicare Other | Admitting: Registered"

## 2018-03-08 DIAGNOSIS — J3081 Allergic rhinitis due to animal (cat) (dog) hair and dander: Secondary | ICD-10-CM | POA: Diagnosis not present

## 2018-03-08 DIAGNOSIS — J301 Allergic rhinitis due to pollen: Secondary | ICD-10-CM | POA: Diagnosis not present

## 2018-03-08 DIAGNOSIS — J3089 Other allergic rhinitis: Secondary | ICD-10-CM | POA: Diagnosis not present

## 2018-03-08 DIAGNOSIS — C8400 Mycosis fungoides, unspecified site: Secondary | ICD-10-CM | POA: Diagnosis not present

## 2018-03-08 DIAGNOSIS — L986 Other infiltrative disorders of the skin and subcutaneous tissue: Secondary | ICD-10-CM | POA: Diagnosis not present

## 2018-03-14 DIAGNOSIS — F3181 Bipolar II disorder: Secondary | ICD-10-CM | POA: Diagnosis not present

## 2018-03-15 ENCOUNTER — Telehealth: Payer: Self-pay | Admitting: Psychiatry

## 2018-03-15 ENCOUNTER — Encounter: Payer: Self-pay | Admitting: Psychiatry

## 2018-03-15 ENCOUNTER — Ambulatory Visit (INDEPENDENT_AMBULATORY_CARE_PROVIDER_SITE_OTHER): Payer: Medicare Other | Admitting: Psychiatry

## 2018-03-15 DIAGNOSIS — F4001 Agoraphobia with panic disorder: Secondary | ICD-10-CM

## 2018-03-15 DIAGNOSIS — F40241 Acrophobia: Secondary | ICD-10-CM | POA: Diagnosis not present

## 2018-03-15 DIAGNOSIS — F422 Mixed obsessional thoughts and acts: Secondary | ICD-10-CM

## 2018-03-15 DIAGNOSIS — F4310 Post-traumatic stress disorder, unspecified: Secondary | ICD-10-CM

## 2018-03-15 DIAGNOSIS — F3181 Bipolar II disorder: Secondary | ICD-10-CM

## 2018-03-15 DIAGNOSIS — F411 Generalized anxiety disorder: Secondary | ICD-10-CM

## 2018-03-15 MED ORDER — LORAZEPAM 2 MG/ML PO CONC: 1.0000 mg | Freq: Three times a day (TID) | ORAL | 1 refills | Status: DC | PRN

## 2018-03-15 MED ORDER — TRAZODONE HCL 50 MG PO TABS: 50.0000 mg | ORAL_TABLET | Freq: Every day | ORAL | 1 refills | Status: DC

## 2018-03-15 NOTE — Progress Notes (Signed)
Danielle Bruce [id number] [birth year] 58 y.o.  Subjective:   Patient ID:  Danielle Bruce is a 58 y.o. (DOB 03-Nov-1960) female.  Chief Complaint:  Chief Complaint  Patient presents with  . Follow-up    Medication Management    HPI Danielle Bruce presents to the office today for follow-up of several diagnoses.  Today is great.  D had her babies twins, [name] and Diomede born in October.  Pt is administering PT care for the babies with help from pt's mother.  Babies are well.  Had bad GI pain again in unusual pattern with .  Put on desipramine 25 and it helped.    Had to stop driving at night due to vision issues at night.    Mood up and down cycling since here.  Times of near SI and other times feeling good.  Also chronic anxiety.  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  Rare cases of psych meds causing these problems.  May have to stop lamotrigine.  Seen at [facility] and has had a lot of anxiety bc terrified of leaving the kids.  3 days of hysteria but prayed a lot and feels more calmed down.    Sleep ok unless anxious.   Review of Systems:  Review of Systems  Gastrointestinal: Positive for abdominal distention.  Musculoskeletal: Positive for back pain.  Neurological: Negative for tremors and weakness.  Psychiatric/Behavioral: Negative for agitation, behavioral problems, confusion, decreased concentration, dysphoric mood, hallucinations, self-injury, sleep disturbance and suicidal ideas. The patient is not nervous/anxious and is not hyperactive.     Medications: I have reviewed the patient's current medications.  Current Outpatient Medications  Medication Sig Dispense Refill  . albuterol (ACCUNEB) 0.63 MG/3ML nebulizer solution Take 1 ampule by nebulization every 6 (six) hours as needed for wheezing.    Marland Kitchen albuterol (PROVENTIL HFA;VENTOLIN HFA) 108 (90 Base) MCG/ACT inhaler Inhale 2 puffs into the lungs every 6 (six) hours as needed for  wheezing or shortness of breath.    . Ascorbic Acid (VITAMIN C) 100 MG tablet Take 100 mg by mouth daily.    Marland Kitchen azelastine (ASTELIN) 0.1 % nasal spray Place into both nostrils 2 (two) times daily. Use in each nostril as directed    . BEPREVE 1.5 % SOLN Place 1 drop into both eyes daily.     . Calcium Carbonate-Vitamin D (CALCIUM + D PO) Take 1 tablet by mouth daily.    . Cholecalciferol (VITAMIN D) 2000 units CAPS Take by mouth.    . cyclobenzaprine (FLEXERIL) 10 MG tablet Take 10 mg by mouth 3 (three) times daily as needed for muscle spasms.    Marland Kitchen DAYTRANA 10 MG/9HR patch APPLY 1 PATCH TO THE SKIN ONCE D AS NEEDED FOR ADD SYMPTOMS  0  . desipramine (NORPRAMIN) 25 MG tablet Take 25 mg by mouth daily.    Marland Kitchen dexlansoprazole (DEXILANT) 60 MG capsule Take 60 mg by mouth daily.    Marland Kitchen dicyclomine (BENTYL) 10 MG capsule Take 1 capsule by mouth 4 (four) times daily as needed for spasms.   0  . diphenhydrAMINE (BENADRYL) 25 mg capsule Take 50 mg by mouth every 6 (six) hours as needed (HEADACHES).     Marland Kitchen docusate sodium (COLACE) 100 MG capsule Take 100 mg by mouth every other day. Alternates with the senokot    . EPINEPHrine 0.3 mg/0.3 mL IJ SOAJ injection as needed.    Eduard Roux (AIMOVIG) 70 MG/ML SOAJ Inject into  the skin.    . fluticasone (FLONASE) 50 MCG/ACT nasal spray Place 2 sprays into both nostrils daily. 16 g 0  . hydrOXYzine (ATARAX/VISTARIL) 25 MG tablet TK 1 TO 2 TS PO TID PRF MIGRAINE  4  . L-Methylfolate-Algae (DEPLIN 15 PO) Take by mouth.    . lamoTRIgine (SUBVENITE) 200 MG tablet Take 200 mg by mouth daily.    Marland Kitchen levocetirizine (XYZAL) 5 MG tablet Take 5 mg by mouth every evening.  5  . LORazepam (ATIVAN) 1 MG tablet Take 1 mg by mouth every 8 (eight) hours as needed for anxiety. (takes 2 mg when having a medical procedure.)    . LORazepam (ATIVAN) 2 MG/ML concentrated solution lorazepam 2 mg/mL oral concentrate    . magnesium oxide (MAG-OX) 400 MG tablet Take by mouth.    . MELATONIN  PO Take 1.5 mg by mouth at bedtime.    . methocarbamol (ROBAXIN) 500 MG tablet Take 1 tablet (500 mg total) by mouth 2 (two) times daily as needed for muscle spasms. 60 tablet 1  . montelukast (SINGULAIR) 10 MG tablet Take 10 mg by mouth at bedtime.    Marland Kitchen oxyCODONE-acetaminophen (PERCOCET/ROXICET) 5-325 MG per tablet Take 2 tablets by mouth daily as needed for moderate pain or severe pain (mighraine.).     Marland Kitchen polyethylene glycol (MIRALAX / GLYCOLAX) packet Take 17 g by mouth daily as needed for mild constipation.     Marland Kitchen PRAVASTATIN SODIUM PO Take 40 mg by mouth.     . predniSONE (DELTASONE) 5 MG tablet TK 1-3 TS PO D  0  . PRESCRIPTION MEDICATION Inject 3 each into the skin every 7 (seven) days. Allergy shot for Cats.     . PROLIA 60 MG/ML SOSY injection BRING TO THE OFFICE FOR INJECTION ON 02/15/2018 AS DIRECTED ONCE EVERY 6 MONTHS    . promethazine (PHENERGAN) 25 MG tablet Take 25 mg by mouth every 6 (six) hours as needed for nausea.     . risperiDONE (RISPERDAL) 0.25 MG tablet Take 0.25 mg by mouth daily as needed. As needed for an anxiety attack     . rizatriptan (MAXALT) 10 MG tablet Take 10 mg by mouth as needed.   4  . senna (SENOKOT) 8.6 MG TABS Take 1 tablet by mouth as needed.     . traZODone (DESYREL) 50 MG tablet Take 50 mg by mouth at bedtime.    Marland Kitchen VASCEPA 1 g CAPS TK 2 CS PO BID WC  3  . VOLTAREN 1 % GEL Apply 2 g topically 4 (four) times daily as needed (JOINT PAIN).   3   No current facility-administered medications for this visit.     Medication Side Effects: Other: constipation resolved, dry mouth.  Allergies:  Allergies  Allergen Reactions  . Sulfa Antibiotics Nausea And Vomiting    Causes pt to vomit blood  . Gluten Meal Other (See Comments)    Sensitivity.   . Lactose Intolerance (Gi) Nausea And Vomiting  . Flagyl [Metronidazole Hcl] Nausea And Vomiting  . Morphine And Related Other (See Comments)    Reaction: Depression, emotional    Past Medical History:   Diagnosis Date  . Anemia   . Anxiety   . Arthritis    osteoarthritis  . ASCUS (atypical squamous cells of undetermined significance) on Pap smear 05/06/05   NEG HIGH RISK HPV--C&B BIOPSY BENIGN 12/2005  . Asthma   . Bipolar 2 disorder ([place])   . Cancer ([place])    skin cancer -  basal cell  . Colon polyps   . Complication of anesthesia    anxious afterwards, will get headaches   . Constipation   . Depression   . Fibromyalgia 10/2013  . GERD (gastroesophageal reflux disease)   . 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 Heart murmur    never had any problems  . Hemorrhoids   . High cholesterol   . 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   cytology negative  . IBS (irritable bowel syndrome)   . Insomnia   . Lymphocytic colitis   . Lymphoma ([place])   . 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 Migraines   . Osteoarthritis   . Osteopenia   . Peripheral neuropathy   . PTSD (post-traumatic stress disorder)     Family History  Problem Relation Age of Onset  . Diabetes Father   . Hypertension Father   . Hyperlipidemia Father   . Heart disease Maternal Grandfather   . Heart disease Paternal Grandmother   . Heart disease Paternal Grandfather   . Depression Son   . Depression Son   . Anxiety disorder Son   . Depression Daughter   . Anxiety disorder Daughter   . Asthma Daughter     Social History   Socioeconomic History  . Marital status: Divorced    Spouse name: Not on file  . Number of children: Not on file  . Years of education: Not on file  . Highest education level: Not on file  Occupational History  . Not on file  Social Needs  . Financial resource strain: Not on file  . Food insecurity:    Worry: Not on file    Inability: Not on file  . Transportation needs:    Medical: Not on file    Non-medical: Not on file  Tobacco Use  . Smoking status: Former Smoker    Types: Cigarettes    Last attempt to  quit: 05/13/2014    Years since quitting: 3.8  . Smokeless tobacco: Never Used  . Tobacco comment: social   Substance and Sexual Activity  . Alcohol use: Yes    Alcohol/week: 0.0 standard drinks    Comment: rare  . Drug use: Never  . Sexual activity: Not Currently    Comment: -1st intercourse 58 yo-More than 5 partners  Lifestyle  . Physical activity:    Days per week: Not on file    Minutes per session: Not on file  . Stress: Not on file  Relationships  . Social connections:    Talks on phone: Not on file    Gets together: Not on file    Attends religious service: Not on file    Active member of club or organization: Not on file    Attends meetings of clubs or organizations: Not on file    Relationship status: Not on file  . Intimate partner violence:    Fear of current or ex partner: Not on file    Emotionally abused: Not on file    Physically abused: Not on file    Forced sexual activity: Not on file  Other Topics Concern  . Not on file  Social History Narrative  . Not on file    Past Medical History, Surgical history, Social history, and Family history were reviewed and updated as appropriate.   Please see review of systems for further details on the patient's review from today.  Objective:   Physical Exam:  LMP 12/28/2010   Physical Exam Constitutional:      General: She is not in acute distress.    Appearance: She is well-developed.  Musculoskeletal:        General: No deformity.  Neurological:     Mental Status: She is alert and oriented to person, place, and time.     Motor: No tremor.     Coordination: Coordination normal.     Gait: Gait normal.  Psychiatric:        Attention and Perception: Attention and perception normal.        Mood and Affect: Mood is not anxious or depressed. Affect is not labile, blunt, angry or inappropriate.        Speech: Speech normal.        Behavior: Behavior normal.        Thought Content: Thought content normal.  Thought content does not include homicidal or suicidal ideation. Thought content does not include homicidal or suicidal plan.        Cognition and Memory: Cognition normal.        Judgment: Judgment normal.     Comments: Insight intact.  Affect more upbeat. No auditory or visual hallucinations. No delusions.      Lab Review:     Component Value Date/Time   NA 142 08/01/2016 1713   NA 144 07/12/2015 1521   K 4.7 08/01/2016 1713   K 3.8 07/12/2015 1521   CL 107 08/01/2016 1713   CO2 23 08/01/2016 1713   CO2 27 07/12/2015 1521   GLUCOSE 95 08/01/2016 1713   GLUCOSE 82 07/12/2015 1521   BUN 14 08/01/2016 1713   BUN 10.3 07/12/2015 1521   CREATININE 0.87 08/01/2016 1713   CREATININE 0.8 07/12/2015 1521   CALCIUM 10.0 08/01/2016 1713   CALCIUM 9.9 07/12/2015 1521   PROT 7.4 08/01/2016 1713   PROT 7.4 07/12/2015 1521   ALBUMIN 4.8 08/01/2016 1713   ALBUMIN 4.4 07/12/2015 1521   AST 23 08/01/2016 1713   AST 22 07/12/2015 1521   ALT 26 08/01/2016 1713   ALT 31 07/12/2015 1521   ALKPHOS 55 08/01/2016 1713   ALKPHOS 54 07/12/2015 1521   BILITOT 0.7 08/01/2016 1713   BILITOT 0.40 07/12/2015 1521   GFRNONAA 76 08/01/2016 1713   GFRAA 88 08/01/2016 1713       Component Value Date/Time   WBC 5.9 08/01/2016 1713   RBC 4.71 08/01/2016 1713   HGB 14.6 08/01/2016 1713   HGB 14.1 07/12/2015 1521   HCT 43.1 08/01/2016 1713   HCT 41.5 07/12/2015 1521   PLT 254 08/01/2016 1713   PLT 248 07/12/2015 1521   MCV 91.5 08/01/2016 1713   MCV 92.2 07/12/2015 1521   MCH 31.0 08/01/2016 1713   MCHC 33.9 08/01/2016 1713   RDW 13.2 08/01/2016 1713   RDW 13.3 07/12/2015 1521   LYMPHSABS 2,006 08/01/2016 1713   LYMPHSABS 1.8 07/12/2015 1521   MONOABS 354 08/01/2016 1713   MONOABS 0.7 07/12/2015 1521   EOSABS 118 08/01/2016 1713   EOSABS 0.1 07/12/2015 1521   BASOSABS 0 08/01/2016 1713   BASOSABS 0.0 07/12/2015 1521    No results found for: POCLITH, LITHIUM   No results found for:  PHENYTOIN, PHENOBARB, VALPROATE, CBMZ   .res Assessment: Plan:    Bipolar II disorder ([place])  Generalized anxiety disorder  Panic disorder with agoraphobia  PTSD (post-traumatic stress disorder)  Mixed obsessional thoughts and acts  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  It's helped the GI pain and disc the potential benefit for migraine.  Aimovig is helping the HA so far but she's fearful of SE related to it.    Seeing Gwen Awl at [facility] twice recently for therapy.  She's dealing with trauma issues on a tolerable pace. Dealing with the anxiety.  Cont other meds.  Disc med sensitivity and hopefully not have to stop lamotrigine unless [facility] is very concerned.  No current med changes. Prefers lorazepam liquid bc faster for severe panic.  FU 2 mos  Lynder Parents, MD, DFAPA            Please see After Visit Summary for patient specific instructions.  Future Appointments  Date Time Provider [place]  03/10/2019  2:00 PM Fontaine, Belinda Block, MD [id number]    No orders of the defined types were placed in this encounter.     -------------------------------

## 2018-03-18 ENCOUNTER — Telehealth: Payer: Self-pay

## 2018-03-18 MED ORDER — LAMOTRIGINE 200 MG PO TABS: 200.0000 mg | ORAL_TABLET | Freq: Every day | ORAL | 0 refills | Status: DC

## 2018-03-18 NOTE — Telephone Encounter (Signed)
Entry error

## 2018-03-18 NOTE — Telephone Encounter (Signed)
Received refill request from [facility] for Subvenite 200 mg tablets everyday #90  Last OV 03/15/2018 Next OV 05/14/2018  Escribed to pharmacy

## 2018-03-19 DIAGNOSIS — D472 Monoclonal gammopathy: Secondary | ICD-10-CM | POA: Diagnosis not present

## 2018-03-21 DIAGNOSIS — F3181 Bipolar II disorder: Secondary | ICD-10-CM | POA: Diagnosis not present

## 2018-03-26 ENCOUNTER — Other Ambulatory Visit: Payer: Self-pay

## 2018-03-26 ENCOUNTER — Telehealth: Payer: Self-pay | Admitting: Psychiatry

## 2018-03-26 MED ORDER — SUBVENITE 200 MG PO TABS: 200.0000 mg | ORAL_TABLET | Freq: Every day | ORAL | 0 refills | Status: DC

## 2018-03-26 NOTE — Telephone Encounter (Signed)
Pt received another generic not the SUBVENITE, escribed correct RX DAW.

## 2018-03-26 NOTE — Telephone Encounter (Signed)
rx sent in to [facility] on [address] but substitution for Subvenite was checked and she needs the same medicine from before. She has already picked it up an wanted to know if you wanted it. Don't want to waste it.

## 2018-03-27 DIAGNOSIS — L986 Other infiltrative disorders of the skin and subcutaneous tissue: Secondary | ICD-10-CM | POA: Diagnosis not present

## 2018-03-27 DIAGNOSIS — D472 Monoclonal gammopathy: Secondary | ICD-10-CM | POA: Diagnosis not present

## 2018-03-28 DIAGNOSIS — M810 Age-related osteoporosis without current pathological fracture: Secondary | ICD-10-CM | POA: Diagnosis not present

## 2018-03-28 DIAGNOSIS — E7800 Pure hypercholesterolemia, unspecified: Secondary | ICD-10-CM | POA: Diagnosis not present

## 2018-03-28 DIAGNOSIS — G43909 Migraine, unspecified, not intractable, without status migrainosus: Secondary | ICD-10-CM | POA: Diagnosis not present

## 2018-03-28 DIAGNOSIS — F3181 Bipolar II disorder: Secondary | ICD-10-CM | POA: Diagnosis not present

## 2018-03-28 DIAGNOSIS — L986 Other infiltrative disorders of the skin and subcutaneous tissue: Secondary | ICD-10-CM | POA: Diagnosis not present

## 2018-03-28 DIAGNOSIS — F900 Attention-deficit hyperactivity disorder, predominantly inattentive type: Secondary | ICD-10-CM | POA: Diagnosis not present

## 2018-03-28 DIAGNOSIS — D472 Monoclonal gammopathy: Secondary | ICD-10-CM | POA: Diagnosis not present

## 2018-03-28 DIAGNOSIS — K589 Irritable bowel syndrome without diarrhea: Secondary | ICD-10-CM | POA: Diagnosis not present

## 2018-03-28 DIAGNOSIS — M797 Fibromyalgia: Secondary | ICD-10-CM | POA: Diagnosis not present

## 2018-03-28 DIAGNOSIS — K5289 Other specified noninfective gastroenteritis and colitis: Secondary | ICD-10-CM | POA: Diagnosis not present

## 2018-03-29 ENCOUNTER — Encounter: Payer: Self-pay | Admitting: Psychiatry

## 2018-03-29 ENCOUNTER — Ambulatory Visit (INDEPENDENT_AMBULATORY_CARE_PROVIDER_SITE_OTHER): Payer: Medicare Other | Admitting: Psychiatry

## 2018-03-29 DIAGNOSIS — F422 Mixed obsessional thoughts and acts: Secondary | ICD-10-CM

## 2018-03-29 DIAGNOSIS — F411 Generalized anxiety disorder: Secondary | ICD-10-CM

## 2018-03-29 DIAGNOSIS — F3181 Bipolar II disorder: Secondary | ICD-10-CM

## 2018-03-29 DIAGNOSIS — F4001 Agoraphobia with panic disorder: Secondary | ICD-10-CM

## 2018-03-29 DIAGNOSIS — F40241 Acrophobia: Secondary | ICD-10-CM | POA: Diagnosis not present

## 2018-03-29 DIAGNOSIS — F4310 Post-traumatic stress disorder, unspecified: Secondary | ICD-10-CM | POA: Diagnosis not present

## 2018-03-29 NOTE — Progress Notes (Signed)
Danielle Bruce [id number] 16-Mar-1960 58 y.o.  Subjective:   Patient ID:  Danielle Bruce is a 58 y.o. (DOB [birth year]) female.  Chief Complaint:  Chief Complaint  Patient presents with  . 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  Per oncology most likely drug is lamotrigine.  And she needs to wean off of the medication to determine if the cutaneous lesions are related.  Her mood is okay at this time.  She is not having suicidal thoughts or severe depression.  She has anxiety about coming off the medication.  She is well aware that she has been on lamotrigine for many years with good results for depression control.  There is a risk of relapse as she comes off of the medication and she is worried about that.  Today is great.  D had her babies twins, [name] and Spavinaw born in October.  Pt is administering PT care for the babies with help from pt's mother.  Babies are well.  Had bad GI pain again in unusual pattern with .  Put on desipramine 25 and it helped.    Had to stop driving at night due to vision issues at night.    Delayed sleep-wake cycle  Past psych meds: Lithium, Zyprexa, valproic acid, Wellbutrin with side effects, Seroquel, Latuda headaches, sertraline irritability, duloxetine, N-acetylcysteine, pramipexole, buspirone side effects, Geodon, carbamazepine, fluvoxamine, methylphenidate, topiramate, risperidone gabapentin, oxcarbazepine, Adderall, Ambien with amnesia, trazodone, Stelazine, fluoxetine, Rexulti.   Review of Systems:  Review of Systems  Gastrointestinal: Positive for abdominal distention.  Musculoskeletal: Positive for back pain.  Neurological: Negative for tremors and weakness.   Psychiatric/Behavioral: Negative for agitation, behavioral problems, confusion, decreased concentration, dysphoric mood, hallucinations, self-injury, sleep disturbance and suicidal ideas. The patient is not nervous/anxious and is not hyperactive.     Medications: I have reviewed the patient's current medications.  Current Outpatient Medications  Medication Sig Dispense Refill  . albuterol (ACCUNEB) 0.63 MG/3ML nebulizer solution Take 1 ampule by nebulization every 6 (six) hours as needed for wheezing.    Marland Kitchen albuterol (PROVENTIL HFA;VENTOLIN HFA) 108 (90 Base) MCG/ACT inhaler Inhale 2 puffs into the lungs every 6 (six) hours as needed for wheezing or shortness of breath.    . Ascorbic Acid (VITAMIN C) 100 MG tablet Take 100 mg by mouth daily.    Marland Kitchen azelastine (ASTELIN) 0.1 % nasal spray Place into both nostrils 2 (two) times daily. Use in each nostril as directed    . BEPREVE 1.5 % SOLN Place 1 drop into both eyes daily.     . Calcium Carbonate-Vitamin D (CALCIUM + D PO) Take 1 tablet by mouth daily.    . Cholecalciferol (VITAMIN D) 2000 units CAPS Take by mouth.    . cyclobenzaprine (FLEXERIL) 10 MG tablet Take 10 mg by mouth 3 (three) times daily as needed for muscle spasms.    Marland Kitchen DAYTRANA 10 MG/9HR patch APPLY 1 PATCH TO THE SKIN ONCE D AS NEEDED FOR ADD SYMPTOMS  0  . desipramine (NORPRAMIN) 25 MG tablet Take 25 mg by mouth daily.    Marland Kitchen dexlansoprazole (DEXILANT) 60 MG capsule Take 60 mg by mouth daily.    Marland Kitchen dicyclomine (BENTYL) 10 MG capsule Take 1 capsule by mouth 4 (four) times  daily as needed for spasms.   0  . diphenhydrAMINE (BENADRYL) 25 mg capsule Take 50 mg by mouth every 6 (six) hours as needed (HEADACHES).     Marland Kitchen docusate sodium (COLACE) 100 MG capsule Take 100 mg by mouth every other day. Alternates with the senokot    . EPINEPHrine 0.3 mg/0.3 mL IJ SOAJ injection as needed.    Eduard Roux (AIMOVIG) 70 MG/ML SOAJ Inject into the skin.    . fluticasone (FLONASE) 50 MCG/ACT nasal  spray Place 2 sprays into both nostrils daily. 16 g 0  . hydrOXYzine (ATARAX/VISTARIL) 25 MG tablet TK 1 TO 2 TS PO TID PRF MIGRAINE  4  . L-Methylfolate-Algae (DEPLIN 15 PO) Take by mouth.    . levocetirizine (XYZAL) 5 MG tablet Take 5 mg by mouth every evening.  5  . LORazepam (ATIVAN) 1 MG tablet Take 1 mg by mouth every 8 (eight) hours as needed for anxiety. (takes 2 mg when having a medical procedure.)    . LORazepam (ATIVAN) 2 MG/ML concentrated solution Take 0.5 mLs (1 mg total) by mouth every 8 (eight) hours as needed for anxiety (severe panic). 30 mL 1  . magnesium oxide (MAG-OX) 400 MG tablet Take by mouth.    . MELATONIN PO Take 1.5 mg by mouth at bedtime.    . methocarbamol (ROBAXIN) 500 MG tablet Take 1 tablet (500 mg total) by mouth 2 (two) times daily as needed for muscle spasms. 60 tablet 1  . montelukast (SINGULAIR) 10 MG tablet Take 10 mg by mouth at bedtime.    Marland Kitchen oxyCODONE-acetaminophen (PERCOCET/ROXICET) 5-325 MG per tablet Take 2 tablets by mouth daily as needed for moderate pain or severe pain (mighraine.).     Marland Kitchen polyethylene glycol (MIRALAX / GLYCOLAX) packet Take 17 g by mouth daily as needed for mild constipation.     Marland Kitchen PRAVASTATIN SODIUM PO Take 40 mg by mouth.     . predniSONE (DELTASONE) 5 MG tablet TK 1-3 TS PO D  0  . PRESCRIPTION MEDICATION Inject 3 each into the skin every 7 (seven) days. Allergy shot for Cats.     . PROLIA 60 MG/ML SOSY injection BRING TO THE OFFICE FOR INJECTION ON 02/15/2018 AS DIRECTED ONCE EVERY 6 MONTHS    . promethazine (PHENERGAN) 25 MG tablet Take 25 mg by mouth every 6 (six) hours as needed for nausea.     . risperiDONE (RISPERDAL) 0.25 MG tablet Take 0.25 mg by mouth daily as needed. As needed for an anxiety attack     . rizatriptan (MAXALT) 10 MG tablet Take 10 mg by mouth as needed.   4  . senna (SENOKOT) 8.6 MG TABS Take 1 tablet by mouth as needed.     . SUBVENITE 200 MG tablet Take 1 tablet (200 mg total) by mouth daily. 90 tablet 0   . traZODone (DESYREL) 50 MG tablet Take 1 tablet (50 mg total) by mouth at bedtime. 90 tablet 1  . VASCEPA 1 g CAPS TK 2 CS PO BID WC  3  . VOLTAREN 1 % GEL Apply 2 g topically 4 (four) times daily as needed (JOINT PAIN).   3   No current facility-administered medications for this visit.     Medication Side Effects: Other: constipation resolved, dry mouth.  Allergies:  Allergies  Allergen Reactions  . Sulfa Antibiotics Nausea And Vomiting    Causes pt to vomit blood  . Gluten Meal Other (See Comments)    Sensitivity.   Marland Kitchen  Lactose Intolerance (Gi) Nausea And Vomiting  . Flagyl [Metronidazole Hcl] Nausea And Vomiting  . Morphine And Related Other (See Comments)    Reaction: Depression, emotional    Past Medical History:  Diagnosis Date  . Anemia   . Anxiety   . Arthritis    osteoarthritis  . ASCUS (atypical squamous cells of undetermined significance) on Pap smear 05/06/05   NEG HIGH RISK HPV--C&B BIOPSY BENIGN 12/2005  . Asthma   . Bipolar 2 disorder ([place])   . Cancer ([place])    skin cancer - basal cell  . Colon polyps   . Complication of anesthesia    anxious afterwards, will get headaches   . Constipation   . Depression   . Fibromyalgia 10/2013  . GERD (gastroesophageal reflux disease)   . 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 Heart murmur    never had any problems  . Hemorrhoids   . High cholesterol   . 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   cytology negative  . IBS (irritable bowel syndrome)   . Insomnia   . Lymphocytic colitis   . Lymphoma ([place])   . 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 Migraines   . Osteoarthritis   . Osteopenia   . Peripheral neuropathy   . PTSD (post-traumatic stress disorder)     Family History  Problem Relation Age of Onset  . Diabetes Father   . Hypertension Father   . Hyperlipidemia Father   . Heart disease Maternal Grandfather   . Heart disease Paternal  Grandmother   . Heart disease Paternal Grandfather   . Depression Son   . Depression Son   . Anxiety disorder Son   . Depression Daughter   . Anxiety disorder Daughter   . Asthma Daughter     Social History   Socioeconomic History  . Marital status: Divorced    Spouse name: Not on file  . Number of children: Not on file  . Years of education: Not on file  . Highest education level: Not on file  Occupational History  . Not on file  Social Needs  . Financial resource strain: Not on file  . Food insecurity:    Worry: Not on file    Inability: Not on file  . Transportation needs:    Medical: Not on file    Non-medical: Not on file  Tobacco Use  . Smoking status: Former Smoker    Types: Cigarettes    Last attempt to quit: 05/13/2014    Years since quitting: 3.8  . Smokeless tobacco: Never Used  . Tobacco comment: social   Substance and Sexual Activity  . Alcohol use: Yes    Alcohol/week: 0.0 standard drinks    Comment: rare  . Drug use: Never  . Sexual activity: Not Currently    Comment: -1st intercourse 58 yo-More than 5 partners  Lifestyle  . Physical activity:    Days per week: Not on file    Minutes per session: Not on file  . Stress: Not on file  Relationships  . Social connections:    Talks on phone: Not on file    Gets together: Not on file    Attends religious service: Not on file    Active member of club or organization: Not on file    Attends meetings of clubs or organizations: Not on file    Relationship status: Not on file  .  Intimate partner violence:    Fear of current or ex partner: Not on file    Emotionally abused: Not on file    Physically abused: Not on file    Forced sexual activity: Not on file  Other Topics Concern  . Not on file  Social History Narrative  . Not on file    Past Medical History, Surgical history, Social history, and Family history were reviewed and updated as appropriate.   Please see review of systems for further  details on the patient's review from today.   Objective:   Physical Exam:  LMP 12/28/2010   Physical Exam Constitutional:      General: She is not in acute distress.    Appearance: She is well-developed.  Musculoskeletal:        General: No deformity.  Neurological:     Mental Status: She is alert and oriented to person, place, and time.     Motor: No tremor.     Coordination: Coordination normal.     Gait: Gait normal.  Psychiatric:        Attention and Perception: Attention and perception normal.        Mood and Affect: Mood is not anxious or depressed. Affect is not labile, blunt, angry or inappropriate.        Speech: Speech normal.        Behavior: Behavior normal.        Thought Content: Thought content normal. Thought content does not include homicidal or suicidal ideation. Thought content does not include homicidal or suicidal plan.        Cognition and Memory: Cognition normal.        Judgment: Judgment normal.     Comments: Insight intact.  Affect more upbeat. No auditory or visual hallucinations. No delusions.      Lab Review:     Component Value Date/Time   NA 142 08/01/2016 1713   NA 144 07/12/2015 1521   K 4.7 08/01/2016 1713   K 3.8 07/12/2015 1521   CL 107 08/01/2016 1713   CO2 23 08/01/2016 1713   CO2 27 07/12/2015 1521   GLUCOSE 95 08/01/2016 1713   GLUCOSE 82 07/12/2015 1521   BUN 14 08/01/2016 1713   BUN 10.3 07/12/2015 1521   CREATININE 0.87 08/01/2016 1713   CREATININE 0.8 07/12/2015 1521   CALCIUM 10.0 08/01/2016 1713   CALCIUM 9.9 07/12/2015 1521   PROT 7.4 08/01/2016 1713   PROT 7.4 07/12/2015 1521   ALBUMIN 4.8 08/01/2016 1713   ALBUMIN 4.4 07/12/2015 1521   AST 23 08/01/2016 1713   AST 22 07/12/2015 1521   ALT 26 08/01/2016 1713   ALT 31 07/12/2015 1521   ALKPHOS 55 08/01/2016 1713   ALKPHOS 54 07/12/2015 1521   BILITOT 0.7 08/01/2016 1713   BILITOT 0.40 07/12/2015 1521   GFRNONAA 76 08/01/2016 1713   GFRAA 88 08/01/2016 1713        Component Value Date/Time   WBC 5.9 08/01/2016 1713   RBC 4.71 08/01/2016 1713   HGB 14.6 08/01/2016 1713   HGB 14.1 07/12/2015 1521   HCT 43.1 08/01/2016 1713   HCT 41.5 07/12/2015 1521   PLT 254 08/01/2016 1713   PLT 248 07/12/2015 1521   MCV 91.5 08/01/2016 1713   MCV 92.2 07/12/2015 1521   MCH 31.0 08/01/2016 1713   MCHC 33.9 08/01/2016 1713   RDW 13.2 08/01/2016 1713   RDW 13.3 07/12/2015 1521   LYMPHSABS 2,006 08/01/2016 1713   LYMPHSABS  1.8 07/12/2015 1521   MONOABS 354 08/01/2016 1713   MONOABS 0.7 07/12/2015 1521   EOSABS 118 08/01/2016 1713   EOSABS 0.1 07/12/2015 1521   BASOSABS 0 08/01/2016 1713   BASOSABS 0.0 07/12/2015 1521    No results found for: POCLITH, LITHIUM   No results found for: PHENYTOIN, PHENOBARB, VALPROATE, CBMZ   .res Assessment: Plan:    Bipolar II disorder ([place])  PTSD (post-traumatic stress disorder)  Generalized anxiety disorder  Panic disorder with agoraphobia  Mixed obsessional thoughts and acts  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  She is very medication sensitive.  Lamotrigine is the primary mood medication for her at this time and there is a risk of relapse off of the medication.  In fact the risk of relapse is likely but it is a requirement to evaluate her medical condition to wean off the lamotrigine. Wean lamotrigine per oncology, 50 mg per week.  Disc risk of mood swings.  Will plan to start Abilify in it's place. We will not initiate aripiprazole at this time because the side effects of aripiprazole that are potential at low dosage are similar to withdrawal symptoms from lamotrigine.  Also aripiprazole generally has a mood effective fairly quickly and we can initiate it when needed.  So we will plan to initiate aripiprazole at follow-up appointment.  Has not tolerated many alternatives that have tried.  Disc desipramine and dosing and risk of mood swings but no  problems so far.  Try cyclic antidepressants are particularly prone to causing mood swings in people with bipolar disorder.  She is aware of this and is keeping the dosage low in order to minimize this risk it's helped the GI pain and disc the potential benefit for migraine.  Aimovig is helping the HA so far but she's fearful of SE related to it.    Seeing Gwen Awl at [facility] twice recently for therapy.  She's dealing with trauma issues on a tolerable pace. Dealing with the anxiety.  Discussed sleep hygiene and how to deal with a delayed sleep cycle by advancing awake time by 15 minutes daily.  Prefers lorazepam liquid bc faster for severe panic.  This appt was 30 mins.  FU 6 weeks  Lynder Parents, MD, DFAPA            Please see After Visit Summary for patient specific instructions.  Future Appointments  Date Time Provider [place]  05/14/2018 10:00 AM Cottle, Billey Co., MD [facility] None  03/10/2019  2:00 PM Fontaine, Belinda Block, MD [facility]    No orders of the defined types were placed in this encounter.     -------------------------------

## 2018-03-31 ENCOUNTER — Other Ambulatory Visit: Payer: Self-pay | Admitting: Psychiatry

## 2018-04-01 ENCOUNTER — Ambulatory Visit: Payer: Medicare Other | Admitting: Psychiatry

## 2018-04-08 DIAGNOSIS — J301 Allergic rhinitis due to pollen: Secondary | ICD-10-CM | POA: Diagnosis not present

## 2018-04-08 DIAGNOSIS — J3089 Other allergic rhinitis: Secondary | ICD-10-CM | POA: Diagnosis not present

## 2018-04-08 DIAGNOSIS — J3081 Allergic rhinitis due to animal (cat) (dog) hair and dander: Secondary | ICD-10-CM | POA: Diagnosis not present

## 2018-04-12 DIAGNOSIS — F3181 Bipolar II disorder: Secondary | ICD-10-CM | POA: Diagnosis not present

## 2018-04-22 DIAGNOSIS — F3181 Bipolar II disorder: Secondary | ICD-10-CM | POA: Diagnosis not present

## 2018-04-24 DIAGNOSIS — J3089 Other allergic rhinitis: Secondary | ICD-10-CM | POA: Diagnosis not present

## 2018-04-24 DIAGNOSIS — J301 Allergic rhinitis due to pollen: Secondary | ICD-10-CM | POA: Diagnosis not present

## 2018-04-24 DIAGNOSIS — J3081 Allergic rhinitis due to animal (cat) (dog) hair and dander: Secondary | ICD-10-CM | POA: Diagnosis not present

## 2018-04-25 DIAGNOSIS — H2513 Age-related nuclear cataract, bilateral: Secondary | ICD-10-CM | POA: Diagnosis not present

## 2018-04-25 DIAGNOSIS — H5213 Myopia, bilateral: Secondary | ICD-10-CM | POA: Diagnosis not present

## 2018-04-25 DIAGNOSIS — H52203 Unspecified astigmatism, bilateral: Secondary | ICD-10-CM | POA: Diagnosis not present

## 2018-04-26 DIAGNOSIS — K589 Irritable bowel syndrome without diarrhea: Secondary | ICD-10-CM | POA: Diagnosis not present

## 2018-04-26 DIAGNOSIS — F4321 Adjustment disorder with depressed mood: Secondary | ICD-10-CM | POA: Diagnosis not present

## 2018-04-26 DIAGNOSIS — F3181 Bipolar II disorder: Secondary | ICD-10-CM | POA: Diagnosis not present

## 2018-04-26 DIAGNOSIS — E7800 Pure hypercholesterolemia, unspecified: Secondary | ICD-10-CM | POA: Diagnosis not present

## 2018-04-26 DIAGNOSIS — G43909 Migraine, unspecified, not intractable, without status migrainosus: Secondary | ICD-10-CM | POA: Diagnosis not present

## 2018-04-26 DIAGNOSIS — M797 Fibromyalgia: Secondary | ICD-10-CM | POA: Diagnosis not present

## 2018-04-26 DIAGNOSIS — D472 Monoclonal gammopathy: Secondary | ICD-10-CM | POA: Diagnosis not present

## 2018-04-26 DIAGNOSIS — K5289 Other specified noninfective gastroenteritis and colitis: Secondary | ICD-10-CM | POA: Diagnosis not present

## 2018-04-26 DIAGNOSIS — L986 Other infiltrative disorders of the skin and subcutaneous tissue: Secondary | ICD-10-CM | POA: Diagnosis not present

## 2018-04-26 DIAGNOSIS — M810 Age-related osteoporosis without current pathological fracture: Secondary | ICD-10-CM | POA: Diagnosis not present

## 2018-04-26 DIAGNOSIS — F900 Attention-deficit hyperactivity disorder, predominantly inattentive type: Secondary | ICD-10-CM | POA: Diagnosis not present

## 2018-05-02 DIAGNOSIS — J3081 Allergic rhinitis due to animal (cat) (dog) hair and dander: Secondary | ICD-10-CM | POA: Diagnosis not present

## 2018-05-02 DIAGNOSIS — J3089 Other allergic rhinitis: Secondary | ICD-10-CM | POA: Diagnosis not present

## 2018-05-02 DIAGNOSIS — J301 Allergic rhinitis due to pollen: Secondary | ICD-10-CM | POA: Diagnosis not present

## 2018-05-07 DIAGNOSIS — J3089 Other allergic rhinitis: Secondary | ICD-10-CM | POA: Diagnosis not present

## 2018-05-07 DIAGNOSIS — J3081 Allergic rhinitis due to animal (cat) (dog) hair and dander: Secondary | ICD-10-CM | POA: Diagnosis not present

## 2018-05-07 DIAGNOSIS — J301 Allergic rhinitis due to pollen: Secondary | ICD-10-CM | POA: Diagnosis not present

## 2018-05-14 ENCOUNTER — Ambulatory Visit: Payer: Medicare Other | Admitting: Psychiatry

## 2018-05-19 IMAGING — MR MR HEAD WO/W CM
11 of 14 series · 31 of 48 positions shown · IV contrast (multihance)
Comparison: MRI 04/02/2008

CLINICAL DATA: Non intractable migraine

EXAM:
MRI HEAD WITHOUT AND WITH CONTRAST
TECHNIQUE: Multiplanar, multiecho pulse sequences of the brain and surrounding
structures were obtained without and with intravenous contrast.
CONTRAST:  12mL MULTIHANCE GADOBENATE DIMEGLUMINE 529 MG/ML IV SOLN

[Series 2: FLAIR · sagittal · 5.0mm · 0.47mm/px · 1 of 23 slices shown (1 of 2)]
[im 1/23]
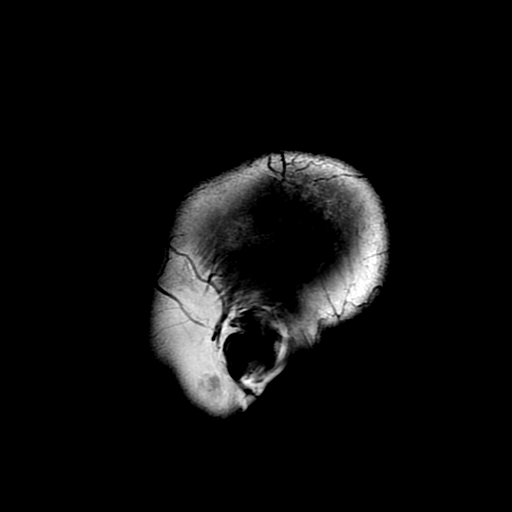

[Series 4: DWI · axial · 3.0mm · 0.94mm/px · z∈[-82,+65]mm · 7 of 100 slices shown (1 of 2)]
[im 1/100]
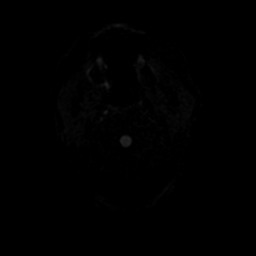
[im 17/100]
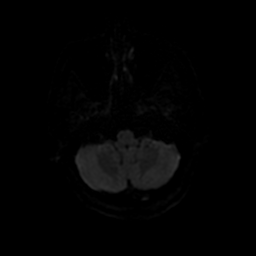
[im 34/100]
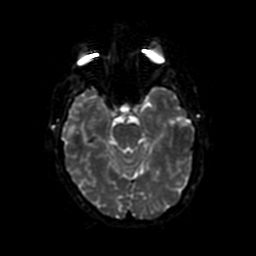
[im 50/100]
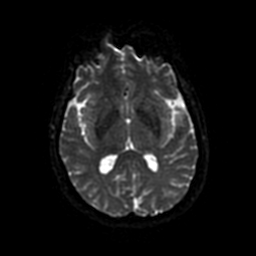
[im 67/100]
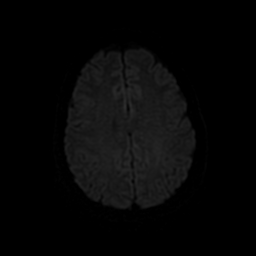
[im 83/100]
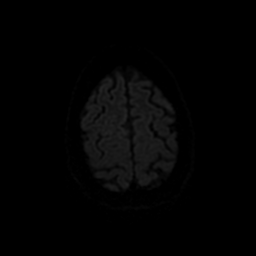
[im 100/100]
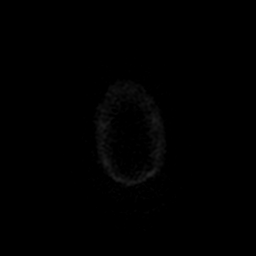

[Series 5: T2 · axial · 5.0mm · 0.47mm/px · z∈[-84,+66]mm · 2 of 26 slices shown]
[im 1/26]
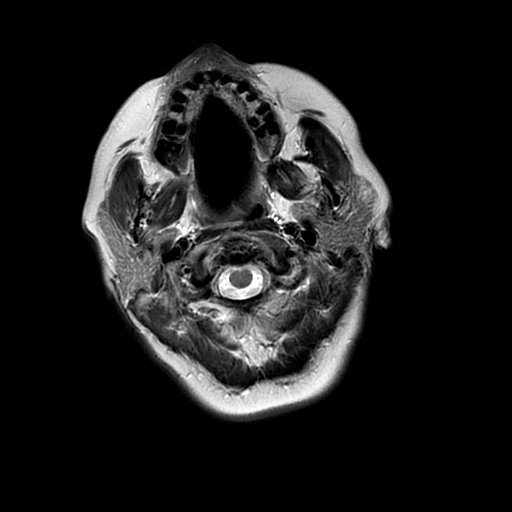
[im 26/26]
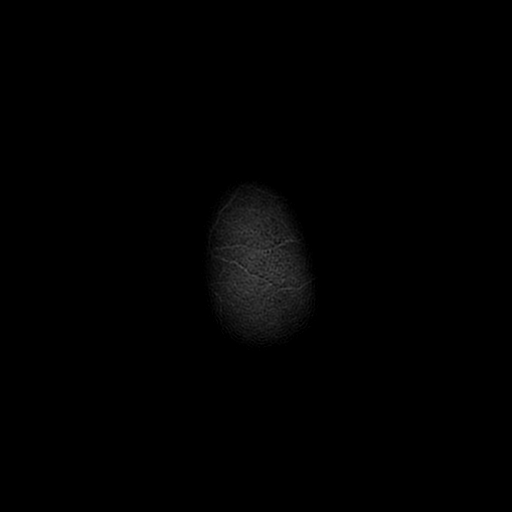

[Series 6: (id) loc ssfse · axial · 5.0mm · 0.59mm/px · z∈[-35,+150]mm · 2 of 33 slices shown]
[im 1/33]
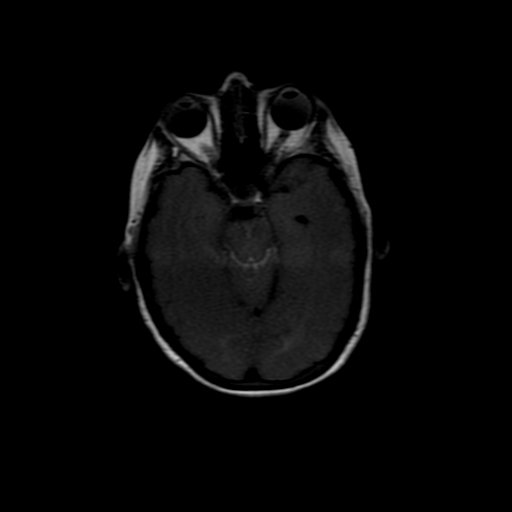
[im 33/33]
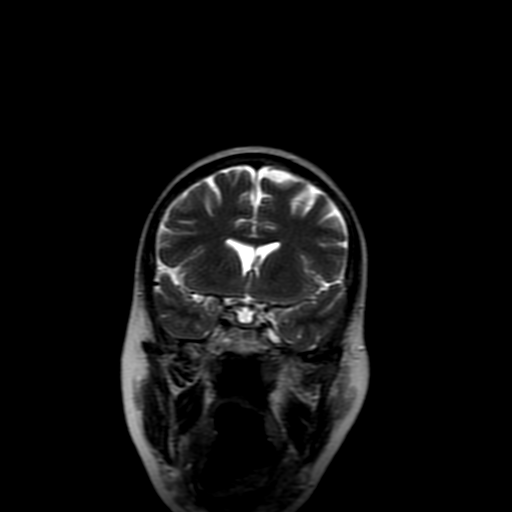

[Series 7: FLAIR · axial · 5.0mm · 0.47mm/px · z∈[-74,+75]mm · 2 of 26 slices shown (2 of 2)]
[im 1/26]
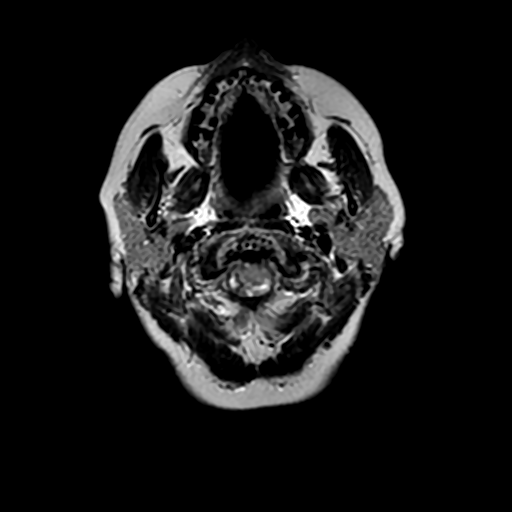
[im 26/26]
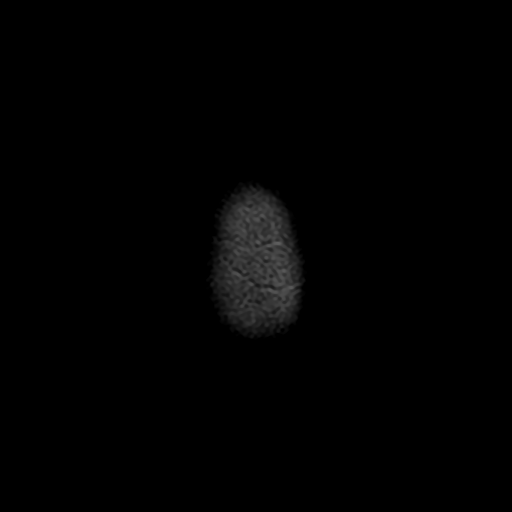

[Series 8: DWI · coronal · 4.0mm · 0.94mm/px · 5 of 72 slices shown (2 of 2)]
[im 1/72]
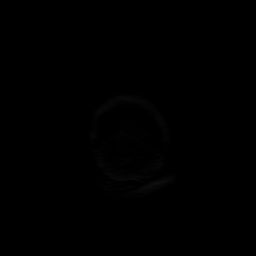
[im 18/72]
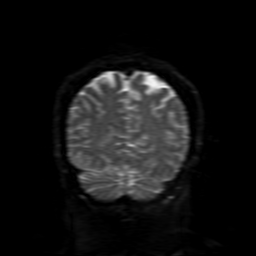
[im 36/72]
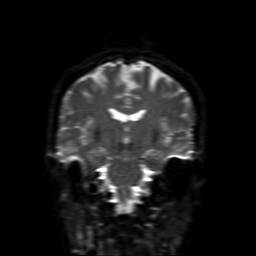
[im 54/72]
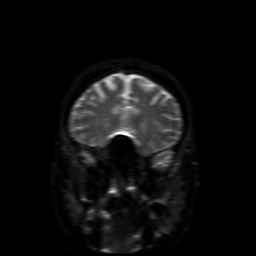
[im 72/72]
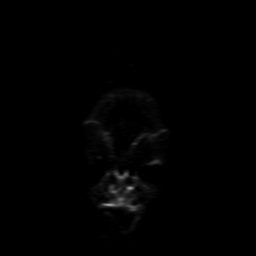

[Series 9: (person_name) · axial · 3.0mm · 0.47mm/px · z∈[-68,-18]mm · 3 of 100 slices shown]
[im 1/100]
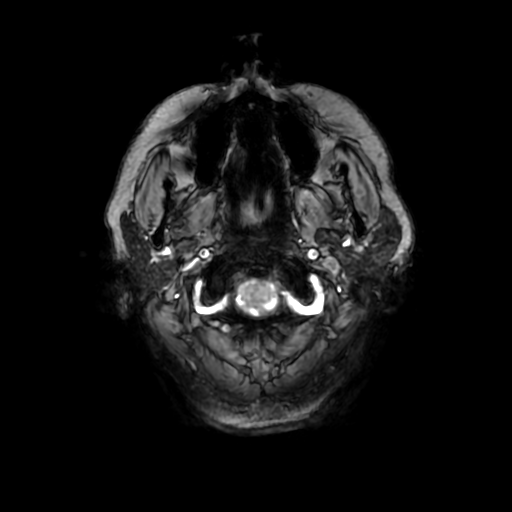
[im 17/100]
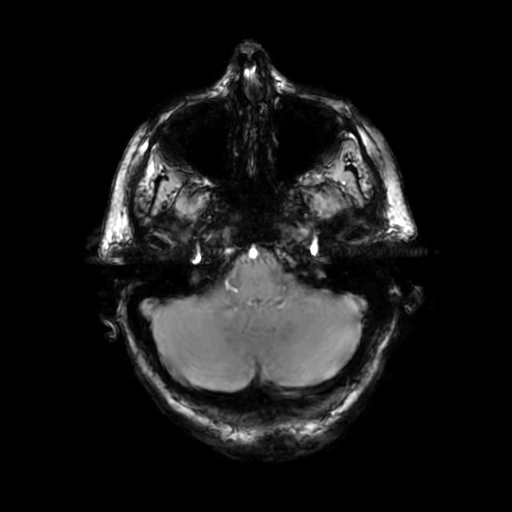
[im 34/100]
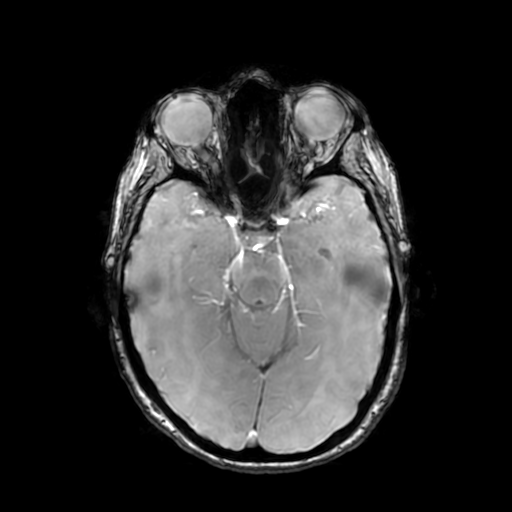

[Series 11: T2 post-contrast · coronal · 5.0mm · 0.39mm/px · 2 of 30 slices shown]
[im 1/30]
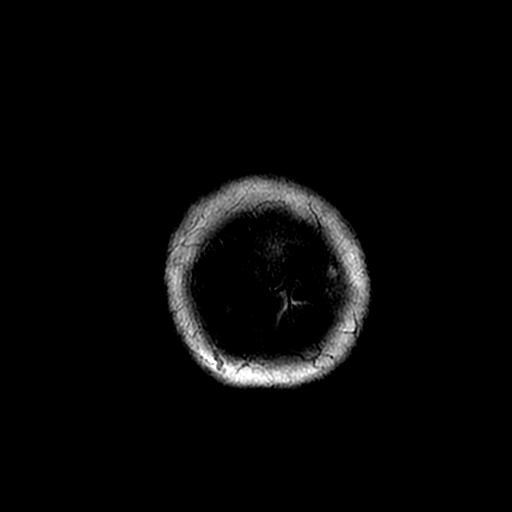
[im 30/30]
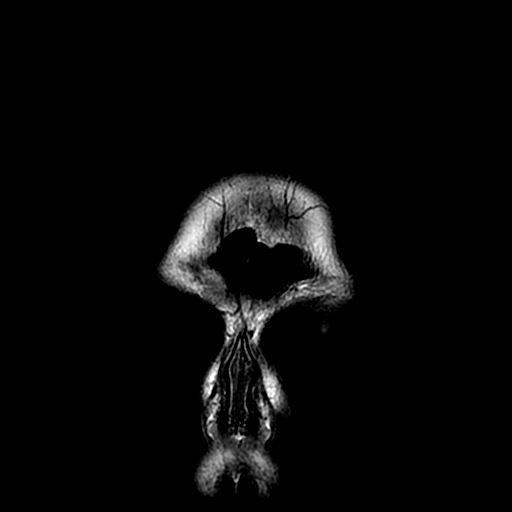

[Series 13: T1 · coronal · 5.0mm · 0.47mm/px · 2 of 30 slices shown]
[im 1/30]
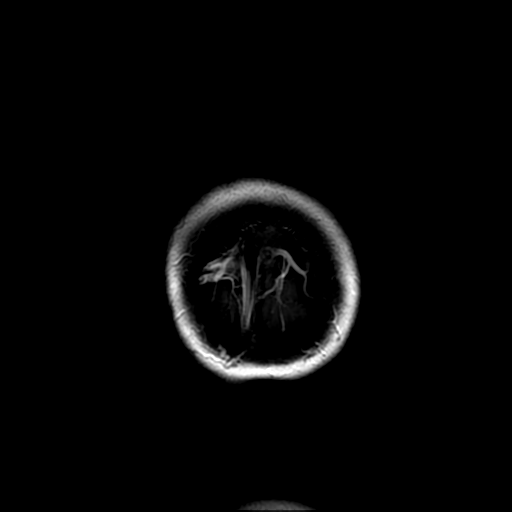
[im 30/30]
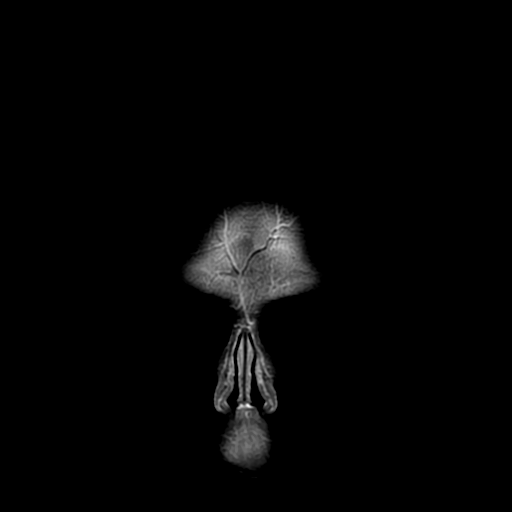

[Series 450: ADC · axial · 3.0mm · 0.94mm/px · z∈[-82,+65]mm · 3 of 49 slices shown (1 of 2)]
[im 1/49]
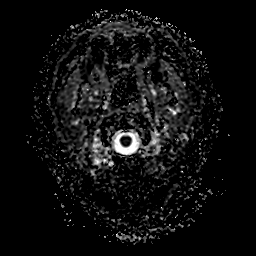
[im 25/49]
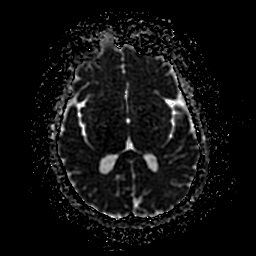
[im 49/49]
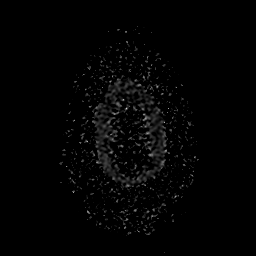

[Series 850: ADC · coronal · 4.0mm · 0.94mm/px · 2 of 36 slices shown (2 of 2)]
[im 1/36]
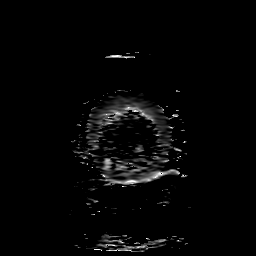
[im 36/36]
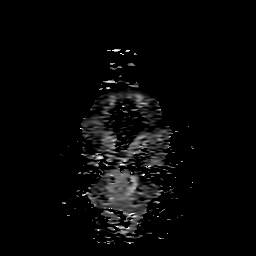

[31 of 48 positions shown; findings below may reference images not displayed]

FINDINGS: Brain: Ventricle size normal. Cerebral volume normal. Negative for
acute or chronic infarction. Negative for demyelinating disease.
Negative for intracranial hemorrhage or mass lesion. No edema or
shift of the midline structures. Pituitary normal in size.

Postcontrast imaging demonstrates normal enhancement. Normal
vascular enhancement. No enhancing mass lesion. Leptomeningeal
enhancement normal.

Vascular: Normal flow voids.

Skull and upper cervical spine: Negative

Sinuses/Orbits: Negative

Other: None
IMPRESSION: Negative MRI of the brain with contrast.

## 2018-05-30 DIAGNOSIS — F3181 Bipolar II disorder: Secondary | ICD-10-CM | POA: Diagnosis not present

## 2018-05-30 DIAGNOSIS — J45909 Unspecified asthma, uncomplicated: Secondary | ICD-10-CM | POA: Diagnosis not present

## 2018-05-30 DIAGNOSIS — M797 Fibromyalgia: Secondary | ICD-10-CM | POA: Diagnosis not present

## 2018-05-30 DIAGNOSIS — F4321 Adjustment disorder with depressed mood: Secondary | ICD-10-CM | POA: Diagnosis not present

## 2018-05-30 DIAGNOSIS — J309 Allergic rhinitis, unspecified: Secondary | ICD-10-CM | POA: Diagnosis not present

## 2018-05-30 DIAGNOSIS — G43909 Migraine, unspecified, not intractable, without status migrainosus: Secondary | ICD-10-CM | POA: Diagnosis not present

## 2018-06-04 DIAGNOSIS — F3181 Bipolar II disorder: Secondary | ICD-10-CM | POA: Diagnosis not present

## 2018-06-05 ENCOUNTER — Ambulatory Visit (INDEPENDENT_AMBULATORY_CARE_PROVIDER_SITE_OTHER): Payer: Medicare Other | Admitting: Psychiatry

## 2018-06-05 ENCOUNTER — Other Ambulatory Visit: Payer: Self-pay

## 2018-06-05 ENCOUNTER — Encounter: Payer: Self-pay | Admitting: Psychiatry

## 2018-06-05 DIAGNOSIS — F4310 Post-traumatic stress disorder, unspecified: Secondary | ICD-10-CM

## 2018-06-05 DIAGNOSIS — F3181 Bipolar II disorder: Secondary | ICD-10-CM

## 2018-06-05 DIAGNOSIS — F411 Generalized anxiety disorder: Secondary | ICD-10-CM

## 2018-06-05 DIAGNOSIS — F4001 Agoraphobia with panic disorder: Secondary | ICD-10-CM | POA: Diagnosis not present

## 2018-06-05 DIAGNOSIS — F40241 Acrophobia: Secondary | ICD-10-CM | POA: Diagnosis not present

## 2018-06-05 DIAGNOSIS — F422 Mixed obsessional thoughts and acts: Secondary | ICD-10-CM | POA: Diagnosis not present

## 2018-06-05 NOTE — Progress Notes (Signed)
Danielle Bruce [id number] 13-Sep-1960 58 y.o.  Subjective:   Patient ID:  Danielle Bruce is a 58 y.o. (DOB 12-19-1960) female.  Chief Complaint:  No chief complaint on file.   HPI  Danielle Bruce presents to the office today for urgent appointment related to psychiatric medications .  Last visit in Jan and taken off lamotrigine DT possible skin reaction.  Goes back May 6 tentatively to reassess.    Overall pretty good.  Dealing with more emotions off lamotrigine.  More easily irritable.  Some intermittent depression and anxiety.  DT being off the lamotrigine.  Having to work on that in therapy.  Not in a terrible place.  More angry but not severe.  Had SI 2 weeks ago.  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  Per oncology most likely drug is lamotrigine.  And she needs to wean off of the medication to determine if the cutaneous lesions are related.    She is well aware that she has been on lamotrigine for many years with good results for depression control.  There is a risk of relapse as she comes off of the medication and she is worried about that.   D had her babies twins, Danielle Bruce and Danielle Bruce born in October.  Pt is administering PT care for the babies with help from pt's mother.  Babies are well.  Home for 27 days.  Misses grandbabies.  Doing some yard work and walking.    Had bad GI pain again in unusual pattern with .  Put on desipramine 25 and increased to 50 mg 2 weeks ago DT depression and SI and it helped.    Still using Ativan liquid prn panic.  Went through period of panic for 3 weeks, less lately.  Had to stop driving at night due to vision issues at night.    Delayed sleep-wake cycle  Past psych meds: Lithium, Zyprexa, valproic acid, Wellbutrin with side effects, Seroquel, Latuda headaches, sertraline irritability, duloxetine, N-acetylcysteine, pramipexole, buspirone side effects, Geodon, carbamazepine,  fluvoxamine, methylphenidate, topiramate, risperidone gabapentin, oxcarbazepine, Adderall, Ambien with amnesia, trazodone, Stelazine, fluoxetine, Rexulti, lamotrigine   Review of Systems:  Review of Systems  Gastrointestinal: Positive for abdominal distention.  Musculoskeletal: Positive for back pain.  Neurological: Negative for tremors and weakness.  Psychiatric/Behavioral: Negative for agitation, behavioral problems, confusion, decreased concentration, dysphoric mood, hallucinations, self-injury, sleep disturbance and suicidal ideas. The patient is not nervous/anxious and is not hyperactive.     Medications: I have reviewed the patient's current medications.  Current Outpatient Medications  Medication Sig Dispense Refill  . albuterol (ACCUNEB) 0.63 MG/3ML nebulizer solution Take 1 ampule by nebulization every 6 (six) hours as needed for wheezing.    Marland Kitchen albuterol (PROVENTIL HFA;VENTOLIN HFA) 108 (90 Base) MCG/ACT inhaler Inhale 2 puffs into the lungs every 6 (six) hours as needed for wheezing or shortness of breath.    . Ascorbic Acid (VITAMIN C) 100 MG tablet Take 100 mg by mouth daily.    Marland Kitchen azelastine (ASTELIN) 0.1 % nasal spray Place into both nostrils 2 (two) times daily. Use in each nostril as directed    . BEPREVE 1.5 % SOLN Place 1 drop into both eyes daily.     . Calcium Carbonate-Vitamin D (CALCIUM + D PO) Take 1 tablet by mouth daily.    . Cholecalciferol (VITAMIN D) 2000 units CAPS Take by mouth.    . cyclobenzaprine (FLEXERIL) 10 MG tablet Take 10  mg by mouth 3 (three) times daily as needed for muscle spasms.    Marland Kitchen DAYTRANA 10 MG/9HR patch APPLY 1 PATCH TO THE SKIN ONCE D AS NEEDED FOR ADD SYMPTOMS  0  . desipramine (NORPRAMIN) 25 MG tablet Take 50 mg by mouth daily.     Marland Kitchen dexlansoprazole (DEXILANT) 60 MG capsule Take 60 mg by mouth daily.    Marland Kitchen dicyclomine (BENTYL) 10 MG capsule Take 1 capsule by mouth 4 (four) times daily as needed for spasms.   0  . diphenhydrAMINE (BENADRYL)  25 mg capsule Take 50 mg by mouth every 6 (six) hours as needed (HEADACHES).     Marland Kitchen docusate sodium (COLACE) 100 MG capsule Take 100 mg by mouth every other day. Alternates with the senokot    . Erenumab-aooe (AIMOVIG) 70 MG/ML SOAJ Inject into the skin.    . fluticasone (FLONASE) 50 MCG/ACT nasal spray Place 2 sprays into both nostrils daily. 16 g 0  . hydrOXYzine (ATARAX/VISTARIL) 25 MG tablet TK 1 TO 2 TS PO TID PRF MIGRAINE  4  . L-methylfolate Calcium 15 MG TABS TAKE 1 TABLET BY MOUTH DAILY 30 tablet 5  . levocetirizine (XYZAL) 5 MG tablet Take 5 mg by mouth every evening.  5  . LORazepam (ATIVAN) 1 MG tablet Take 1 mg by mouth every 8 (eight) hours as needed for anxiety. (takes 2 mg when having a medical procedure.)    . LORazepam (ATIVAN) 2 MG/ML concentrated solution Take 0.5 mLs (1 mg total) by mouth every 8 (eight) hours as needed for anxiety (severe panic). 30 mL 1  . magnesium oxide (MAG-OX) 400 MG tablet Take by mouth.    . MELATONIN PO Take 1.5 mg by mouth at bedtime.    . methocarbamol (ROBAXIN) 500 MG tablet Take 1 tablet (500 mg total) by mouth 2 (two) times daily as needed for muscle spasms. 60 tablet 1  . montelukast (SINGULAIR) 10 MG tablet Take 10 mg by mouth at bedtime.    Marland Kitchen oxyCODONE-acetaminophen (PERCOCET/ROXICET) 5-325 MG per tablet Take 2 tablets by mouth daily as needed for moderate pain or severe pain (mighraine.).     Marland Kitchen polyethylene glycol (MIRALAX / GLYCOLAX) packet Take 17 g by mouth daily as needed for mild constipation.     Marland Kitchen PRAVASTATIN SODIUM PO Take 40 mg by mouth.     . risperiDONE (RISPERDAL) 0.25 MG tablet Take 0.25 mg by mouth daily as needed. As needed for an anxiety attack     . rizatriptan (MAXALT) 10 MG tablet Take 10 mg by mouth as needed.   4  . senna (SENOKOT) 8.6 MG TABS Take 1 tablet by mouth as needed.     . traZODone (DESYREL) 50 MG tablet Take 1 tablet (50 mg total) by mouth at bedtime. 90 tablet 1  . Ubrogepant (UBRELVY) 50 MG TABS Take 50-100  mg by mouth daily as needed.    Marland Kitchen EPINEPHrine 0.3 mg/0.3 mL IJ SOAJ injection as needed.    . predniSONE (DELTASONE) 5 MG tablet TK 1-3 TS PO D  0  . PRESCRIPTION MEDICATION Inject 3 each into the skin every 7 (seven) days. Allergy shot for Cats.     . PROLIA 60 MG/ML SOSY injection BRING TO THE OFFICE FOR INJECTION ON 02/15/2018 AS DIRECTED ONCE EVERY 6 MONTHS    . promethazine (PHENERGAN) 25 MG tablet Take 25 mg by mouth every 6 (six) hours as needed for nausea.     Marland Kitchen VASCEPA 1 g CAPS  TK 2 CS PO BID WC  3  . VOLTAREN 1 % GEL Apply 2 g topically 4 (four) times daily as needed (JOINT PAIN).   3   No current facility-administered medications for this visit.     Medication Side Effects: Other: constipation resolved, dry mouth.  Allergies:  Allergies  Allergen Reactions  . Sulfa Antibiotics Nausea And Vomiting    Causes pt to vomit blood  . Gluten Meal Other (See Comments)    Sensitivity.   . Lactose Intolerance (Gi) Nausea And Vomiting  . Flagyl [Metronidazole Hcl] Nausea And Vomiting  . Morphine And Related Other (See Comments)    Reaction: Depression, emotional    Past Medical History:  Diagnosis Date  . Anemia   . Anxiety   . Arthritis    osteoarthritis  . ASCUS (atypical squamous cells of undetermined significance) on Pap smear 05/06/05   NEG HIGH RISK HPV--C&B BIOPSY BENIGN 12/2005  . Asthma   . Bipolar 2 disorder ([place])   . Cancer ([place])    skin cancer - basal cell  . Colon polyps   . Complication of anesthesia    anxious afterwards, will get headaches   . Constipation   . Depression   . Fibromyalgia 10/2013  . GERD (gastroesophageal reflux disease)   . 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 Heart murmur    never had any problems  . Hemorrhoids   . High cholesterol   . 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   cytology negative  . IBS (irritable bowel syndrome)   . Insomnia   . Lymphocytic colitis   . Lymphoma ([place])   . 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 Migraines   . Osteoarthritis   . Osteopenia   . Peripheral neuropathy   . PTSD (post-traumatic stress disorder)     Family History  Problem Relation Age of Onset  . Diabetes Father   . Hypertension Father   . Hyperlipidemia Father   . Heart disease Maternal Grandfather   . Heart disease Paternal Grandmother   . Heart disease Paternal Grandfather   . Depression Son   . Depression Son   . Anxiety disorder Son   . Depression Daughter   . Anxiety disorder Daughter   . Asthma Daughter     Social History   Socioeconomic History  . Marital status: Divorced    Spouse name: Not on file  . Number of children: Not on file  . Years of education: Not on file  . Highest education level: Not on file  Occupational History  . Not on file  Social Needs  . Financial resource strain: Not on file  . Food insecurity:    Worry: Not on file    Inability: Not on file  . Transportation needs:    Medical: Not on file    Non-medical: Not on file  Tobacco Use  . Smoking status: Former Smoker    Types: Cigarettes    Last attempt to quit: 05/13/2014    Years since quitting: 4.0  . Smokeless tobacco: Never Used  . Tobacco comment: social   Substance and Sexual Activity  . Alcohol use: Yes    Alcohol/week: 0.0 standard drinks    Comment: rare  . Drug use: Never  . Sexual activity: Not Currently    Comment: -1st intercourse 58 yo-More than 5 partners  Lifestyle  . Physical activity:  Days per week: Not on file    Minutes per session: Not on file  . Stress: Not on file  Relationships  . Social connections:    Talks on phone: Not on file    Gets together: Not on file    Attends religious service: Not on file    Active member of club or organization: Not on file    Attends meetings of clubs or organizations: Not on file    Relationship status: Not on file  . Intimate partner violence:    Fear of current or ex partner:  Not on file    Emotionally abused: Not on file    Physically abused: Not on file    Forced sexual activity: Not on file  Other Topics Concern  . Not on file  Social History Narrative  . Not on file    Past Medical History, Surgical history, Social history, and Family history were reviewed and updated as appropriate.   Please see review of systems for further details on the patient's review from today.   Objective:   Physical Exam:  LMP 12/28/2010   Physical Exam Neurological:     Mental Status: She is alert and oriented to person, place, and time.     Cranial Nerves: No dysarthria.  Psychiatric:        Attention and Perception: Attention normal.        Speech: Speech normal.        Behavior: Behavior is cooperative.        Thought Content: Thought content normal. Thought content is not paranoid or delusional. Thought content does not include homicidal or suicidal ideation. Thought content does not include homicidal or suicidal plan.        Cognition and Memory: Cognition and memory normal.        Judgment: Judgment normal.     Comments: Generally more mood instability but no severe mood swings currently.  She did have a period of suicidal thoughts a couple of weeks ago.  She handled it well and did not act on the thoughts.  She commits to safety.     Lab Review:     Component Value Date/Time   NA 142 08/01/2016 1713   NA 144 07/12/2015 1521   K 4.7 08/01/2016 1713   K 3.8 07/12/2015 1521   CL 107 08/01/2016 1713   CO2 23 08/01/2016 1713   CO2 27 07/12/2015 1521   GLUCOSE 95 08/01/2016 1713   GLUCOSE 82 07/12/2015 1521   BUN 14 08/01/2016 1713   BUN 10.3 07/12/2015 1521   CREATININE 0.87 08/01/2016 1713   CREATININE 0.8 07/12/2015 1521   CALCIUM 10.0 08/01/2016 1713   CALCIUM 9.9 07/12/2015 1521   PROT 7.4 08/01/2016 1713   PROT 7.4 07/12/2015 1521   ALBUMIN 4.8 08/01/2016 1713   ALBUMIN 4.4 07/12/2015 1521   AST 23 08/01/2016 1713   AST 22 07/12/2015 1521    ALT 26 08/01/2016 1713   ALT 31 07/12/2015 1521   ALKPHOS 55 08/01/2016 1713   ALKPHOS 54 07/12/2015 1521   BILITOT 0.7 08/01/2016 1713   BILITOT 0.40 07/12/2015 1521   GFRNONAA 76 08/01/2016 1713   GFRAA 88 08/01/2016 1713       Component Value Date/Time   WBC 5.9 08/01/2016 1713   RBC 4.71 08/01/2016 1713   HGB 14.6 08/01/2016 1713   HGB 14.1 07/12/2015 1521   HCT 43.1 08/01/2016 1713   HCT 41.5 07/12/2015 1521   PLT 254 08/01/2016  1713   PLT 248 07/12/2015 1521   MCV 91.5 08/01/2016 1713   MCV 92.2 07/12/2015 1521   MCH 31.0 08/01/2016 1713   MCHC 33.9 08/01/2016 1713   RDW 13.2 08/01/2016 1713   RDW 13.3 07/12/2015 1521   LYMPHSABS 2,006 08/01/2016 1713   LYMPHSABS 1.8 07/12/2015 1521   MONOABS 354 08/01/2016 1713   MONOABS 0.7 07/12/2015 1521   EOSABS 118 08/01/2016 1713   EOSABS 0.1 07/12/2015 1521   BASOSABS 0 08/01/2016 1713   BASOSABS 0.0 07/12/2015 1521    No results found for: POCLITH, LITHIUM   No results found for: PHENYTOIN, PHENOBARB, VALPROATE, CBMZ   .res Assessment: Plan:    Bipolar II disorder ([place])  PTSD (post-traumatic stress disorder)  Generalized anxiety disorder  Panic disorder with agoraphobia  Mixed obsessional thoughts and acts  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  She is very medication sensitive.  Lamotrigine is the primary mood medication for her at this time and there is a risk of relapse off of the medication.  In fact the risk of relapse is likely but it is a requirement to evaluate her medical condition to wean off the lamotrigine. She has weaned off lamotrigine per oncology.  She has been off of it for 2 months or longer.  She has a follow-up appointment with [facility] oncology in May unless it is deferred due to the Covid virus.    Disc risk of mood swings.  Will plan to start Abilify in it's place if she has mood swings.. We will not initiate aripiprazole at this time because the  side effects of aripiprazole that are potential at low dosage are similar to withdrawal symptoms from lamotrigine.  Also aripiprazole generally has a mood effective fairly quickly and we can initiate it when needed.  She is having mild mood swings off the lamotrigine but they are not severe and she wants to defer starting any medication for it.  She is working in therapy on being more mindful and other techniques to manage it.  If she has recurrent periods of suicidal thoughts I will strongly encourage her to start medication.  Has not tolerated many alternatives that have tried.  Disc desipramine and dosing and risk of mood swings but no problems so far.  Try cyclic antidepressants are particularly prone to causing mood swings in people with bipolar disorder.  She is aware of this and is keeping the dosage low in order to minimize this risk it's helped the GI pain and disc the potential benefit for migraine.  Aimovig is helping the HA so far but she's fearful of SE related to it.    Seeing Gwen Awl at [facility] twice recently for therapy.  She's dealing with trauma issues on a tolerable pace. Dealing with the anxiety.  Discussed sleep hygiene and how to deal with a delayed sleep cycle by advancing awake time by 15 minutes daily.  Prefers lorazepam liquid bc faster for severe panic.  This appt was 35 mins.  FU 6 weeks  I connected with patient by a video enabled telemedicine application WebEx, with their informed consent, and verified patient privacy and that I am speaking with the correct person using two identifiers.  I was located at office and patient at home.  Lynder Parents, MD, DFAPA            Please see After Visit Summary for patient specific instructions.  Future Appointments  Date Time  Provider [place]  03/10/2019  2:00 PM Fontaine, Belinda Block, MD [id number]    No orders of the defined types were placed in this encounter.      -------------------------------

## 2018-06-25 DIAGNOSIS — J45909 Unspecified asthma, uncomplicated: Secondary | ICD-10-CM | POA: Diagnosis not present

## 2018-06-25 DIAGNOSIS — J309 Allergic rhinitis, unspecified: Secondary | ICD-10-CM | POA: Diagnosis not present

## 2018-06-25 DIAGNOSIS — K589 Irritable bowel syndrome without diarrhea: Secondary | ICD-10-CM | POA: Diagnosis not present

## 2018-06-25 DIAGNOSIS — F900 Attention-deficit hyperactivity disorder, predominantly inattentive type: Secondary | ICD-10-CM | POA: Diagnosis not present

## 2018-06-25 DIAGNOSIS — F4321 Adjustment disorder with depressed mood: Secondary | ICD-10-CM | POA: Diagnosis not present

## 2018-06-25 DIAGNOSIS — G43909 Migraine, unspecified, not intractable, without status migrainosus: Secondary | ICD-10-CM | POA: Diagnosis not present

## 2018-06-25 DIAGNOSIS — M797 Fibromyalgia: Secondary | ICD-10-CM | POA: Diagnosis not present

## 2018-06-25 DIAGNOSIS — F3181 Bipolar II disorder: Secondary | ICD-10-CM | POA: Diagnosis not present

## 2018-06-26 DIAGNOSIS — F3181 Bipolar II disorder: Secondary | ICD-10-CM | POA: Diagnosis not present

## 2018-07-01 DIAGNOSIS — L986 Other infiltrative disorders of the skin and subcutaneous tissue: Secondary | ICD-10-CM | POA: Diagnosis not present

## 2018-07-18 ENCOUNTER — Other Ambulatory Visit: Payer: Self-pay

## 2018-07-18 ENCOUNTER — Encounter: Payer: Self-pay | Admitting: Psychiatry

## 2018-07-18 ENCOUNTER — Ambulatory Visit (INDEPENDENT_AMBULATORY_CARE_PROVIDER_SITE_OTHER): Payer: Medicare Other | Admitting: Psychiatry

## 2018-07-18 DIAGNOSIS — F4310 Post-traumatic stress disorder, unspecified: Secondary | ICD-10-CM

## 2018-07-18 DIAGNOSIS — F422 Mixed obsessional thoughts and acts: Secondary | ICD-10-CM

## 2018-07-18 DIAGNOSIS — F40241 Acrophobia: Secondary | ICD-10-CM | POA: Diagnosis not present

## 2018-07-18 DIAGNOSIS — F411 Generalized anxiety disorder: Secondary | ICD-10-CM | POA: Diagnosis not present

## 2018-07-18 DIAGNOSIS — F4001 Agoraphobia with panic disorder: Secondary | ICD-10-CM | POA: Diagnosis not present

## 2018-07-18 DIAGNOSIS — F3181 Bipolar II disorder: Secondary | ICD-10-CM | POA: Diagnosis not present

## 2018-07-18 NOTE — Progress Notes (Signed)
Danielle Bruce [id number] 08-15-1960 58 y.o.   Virtual Visit via Telephone Note  I connected with pt by telephone and verified that I am speaking with the correct person using two identifiers.   I discussed the limitations, risks, security and privacy concerns of performing an evaluation and management service by telephone and the availability of in person appointments. I also discussed with the patient that there may be a patient responsible charge related to this service. The patient expressed understanding and agreed to proceed.  I discussed the assessment and treatment plan with the patient. The patient was provided an opportunity to ask questions and all were answered. The patient agreed with the plan and demonstrated an understanding of the instructions.   The patient was advised to call back or seek an in-person evaluation if the symptoms worsen or if the condition fails to improve as anticipated.  I provided 30 minutes of non-face-to-face time during this encounter. The call started at 230 and ended at 300. The patient was located at home and the provider was located office.   Subjective:   Patient ID:  Danielle Bruce is a 58 y.o. (DOB 07/24/1960) female.  Chief Complaint:  Chief Complaint  Patient presents with  . Follow-up    mood and anxiety  . Medication Problem    lamotrigine    HPI  Danielle Bruce presents today for follow up of depression and anxiety  And med changes.  Last visit in April and taken off lamotrigine DT possible skin reaction.  Had appt with [facility] dermatology and they assume that skin reaction with itching was related to lamotrigine. 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  A lot of stressors and losses recently triggered more depression really bad then better and now sort of blah.  Hopeful.  Still seeing therapist only once every 3 weeks and not getting a lot out of it.  Can't see her weekly bc therapist too busy.  Overall  pretty good.  Dealing with more emotions off lamotrigine.  More easily irritable.  Some intermittent depression and anxiety.  DT being off the lamotrigine.  Having to work on that in therapy.  Not in a terrible place.  More angry but not severe.  Anxiety situational and variable.  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  Per oncology most likely drug is lamotrigine.  And she weaned off of the medication to determine if the cutaneous lesions are related.    She is well aware that she has been on lamotrigine for many years with good results for depression control.  There is a risk of relapse as she comes off of the medication and she is worried about that.   D had her babies twins, Danielle Bruce and Danielle Bruce born in October.  Pt is administering PT care for the babies with help from pt's mother.  Babies are well.  Home for 27 days.  Misses grandbabies.  Doing some yard work and walking.    Had bad GI pain again in unusual pattern with .  Put on desipramine 25 and increased to 50 mg 2 weeks ago DT depression and SI and it helped.    Still using Ativan liquid prn panic.  Went through period of panic for 3 weeks, less lately.  Had to stop driving at night due to vision issues at night.    Delayed sleep-wake cycle  Past psych meds: Lithium, Zyprexa, valproic acid, Wellbutrin with  side effects, Seroquel, Latuda headaches, sertraline irritability, duloxetine, N-acetylcysteine, pramipexole, buspirone side effects, Geodon, carbamazepine, fluvoxamine, methylphenidate, topiramate, risperidone gabapentin, oxcarbazepine, Adderall, Ambien with amnesia, trazodone, Stelazine, fluoxetine, Rexulti, lamotrigine  Review of Systems:  Review of Systems  Gastrointestinal: Positive for abdominal distention.  Musculoskeletal: Positive for back pain.  Neurological: Negative for tremors and weakness.  Psychiatric/Behavioral: Positive for dysphoric mood. Negative  for agitation, behavioral problems, confusion, decreased concentration, hallucinations, self-injury, sleep disturbance and suicidal ideas. The patient is not nervous/anxious and is not hyperactive.     Medications: I have reviewed the patient's current medications.  Current Outpatient Medications  Medication Sig Dispense Refill  . albuterol (ACCUNEB) 0.63 MG/3ML nebulizer solution Take 1 ampule by nebulization every 6 (six) hours as needed for wheezing.    Marland Kitchen albuterol (PROVENTIL HFA;VENTOLIN HFA) 108 (90 Base) MCG/ACT inhaler Inhale 2 puffs into the lungs every 6 (six) hours as needed for wheezing or shortness of breath.    . Ascorbic Acid (VITAMIN C) 100 MG tablet Take 100 mg by mouth daily.    Marland Kitchen azelastine (ASTELIN) 0.1 % nasal spray Place into both nostrils 2 (two) times daily. Use in each nostril as directed    . BEPREVE 1.5 % SOLN Place 1 drop into both eyes daily.     . Calcium Carbonate-Vitamin D (CALCIUM + D PO) Take 1 tablet by mouth daily.    . Cholecalciferol (VITAMIN D) 2000 units CAPS Take by mouth.    . cyclobenzaprine (FLEXERIL) 10 MG tablet Take 10 mg by mouth 3 (three) times daily as needed for muscle spasms.    Marland Kitchen DAYTRANA 10 MG/9HR patch APPLY 1 PATCH TO THE SKIN ONCE D AS NEEDED FOR ADD SYMPTOMS  0  . desipramine (NORPRAMIN) 25 MG tablet Take 50 mg by mouth daily.     Marland Kitchen dexlansoprazole (DEXILANT) 60 MG capsule Take 60 mg by mouth daily.    Marland Kitchen dicyclomine (BENTYL) 10 MG capsule Take 1 capsule by mouth 4 (four) times daily as needed for spasms.   0  . diphenhydrAMINE (BENADRYL) 25 mg capsule Take 50 mg by mouth every 6 (six) hours as needed (HEADACHES).     Marland Kitchen docusate sodium (COLACE) 100 MG capsule Take 100 mg by mouth every other day. Alternates with the senokot    . EPINEPHrine 0.3 mg/0.3 mL IJ SOAJ injection as needed.    Eduard Roux (AIMOVIG) 70 MG/ML SOAJ Inject into the skin.    . fluticasone (FLONASE) 50 MCG/ACT nasal spray Place 2 sprays into both nostrils daily. 16  g 0  . hydrOXYzine (ATARAX/VISTARIL) 25 MG tablet TK 1 TO 2 TS PO TID PRF MIGRAINE  4  . L-methylfolate Calcium 15 MG TABS TAKE 1 TABLET BY MOUTH DAILY 30 tablet 5  . levocetirizine (XYZAL) 5 MG tablet Take 5 mg by mouth every evening.  5  . LORazepam (ATIVAN) 1 MG tablet Take 1 mg by mouth every 8 (eight) hours as needed for anxiety. (takes 2 mg when having a medical procedure.)    . LORazepam (ATIVAN) 2 MG/ML concentrated solution Take 0.5 mLs (1 mg total) by mouth every 8 (eight) hours as needed for anxiety (severe panic). 30 mL 1  . magnesium oxide (MAG-OX) 400 MG tablet Take by mouth.    . MELATONIN PO Take 1.5 mg by mouth at bedtime.    . methocarbamol (ROBAXIN) 500 MG tablet Take 1 tablet (500 mg total) by mouth 2 (two) times daily as needed for muscle spasms. 60 tablet 1  .  montelukast (SINGULAIR) 10 MG tablet Take 10 mg by mouth at bedtime.    . polyethylene glycol (MIRALAX / GLYCOLAX) packet Take 17 g by mouth daily as needed for mild constipation.     Marland Kitchen PRAVASTATIN SODIUM PO Take 40 mg by mouth.     . PROLIA 60 MG/ML SOSY injection BRING TO THE OFFICE FOR INJECTION ON 02/15/2018 AS DIRECTED ONCE EVERY 6 MONTHS    . promethazine (PHENERGAN) 25 MG tablet Take 25 mg by mouth every 6 (six) hours as needed for nausea.     . risperiDONE (RISPERDAL) 0.25 MG tablet Take 0.25 mg by mouth daily as needed. As needed for an anxiety attack     . rizatriptan (MAXALT) 10 MG tablet Take 10 mg by mouth as needed.   4  . senna (SENOKOT) 8.6 MG TABS Take 1 tablet by mouth as needed.     . traZODone (DESYREL) 50 MG tablet Take 1 tablet (50 mg total) by mouth at bedtime. 90 tablet 1  . oxyCODONE-acetaminophen (PERCOCET/ROXICET) 5-325 MG per tablet Take 2 tablets by mouth daily as needed for moderate pain or severe pain (mighraine.).     Marland Kitchen predniSONE (DELTASONE) 5 MG tablet TK 1-3 TS PO D  0  . PRESCRIPTION MEDICATION Inject 3 each into the skin every 7 (seven) days. Allergy shot for Cats.     . Ubrogepant  (UBRELVY) 50 MG TABS Take 50-100 mg by mouth daily as needed.    Marland Kitchen VASCEPA 1 g CAPS TK 2 CS PO BID WC  3  . VOLTAREN 1 % GEL Apply 2 g topically 4 (four) times daily as needed (JOINT PAIN).   3   No current facility-administered medications for this visit.     Medication Side Effects: Other: constipation resolved, dry mouth.  Allergies:  Allergies  Allergen Reactions  . Sulfa Antibiotics Nausea And Vomiting    Causes pt to vomit blood  . Gluten Meal Other (See Comments)    Sensitivity.   . Lactose Intolerance (Gi) Nausea And Vomiting  . Flagyl [Metronidazole Hcl] Nausea And Vomiting  . Morphine And Related Other (See Comments)    Reaction: Depression, emotional    Past Medical History:  Diagnosis Date  . Anemia   . Anxiety   . Arthritis    osteoarthritis  . ASCUS (atypical squamous cells of undetermined significance) on Pap smear 05/06/05   NEG HIGH RISK HPV--C&B BIOPSY BENIGN 12/2005  . Asthma   . Bipolar 2 disorder ([place])   . Cancer ([place])    skin cancer - basal cell  . Colon polyps   . Complication of anesthesia    anxious afterwards, will get headaches   . Constipation   . Depression   . Fibromyalgia 10/2013  . GERD (gastroesophageal reflux disease)   . 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 Heart murmur    never had any problems  . Hemorrhoids   . High cholesterol   . 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   cytology negative  . IBS (irritable bowel syndrome)   . Insomnia   . Lymphocytic colitis   . Lymphoma ([place])   . 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 Migraines   . Osteoarthritis   . Osteopenia   . Peripheral neuropathy   . PTSD (post-traumatic stress disorder)     Family History  Problem Relation Age of Onset  . Diabetes Father   .  Hypertension Father   . Hyperlipidemia Father   . Heart disease Maternal Grandfather   . Heart disease Paternal Grandmother   . Heart disease  Paternal Grandfather   . Depression Son   . Depression Son   . Anxiety disorder Son   . Depression Daughter   . Anxiety disorder Daughter   . Asthma Daughter     Social History   Socioeconomic History  . Marital status: Divorced    Spouse name: Not on file  . Number of children: Not on file  . Years of education: Not on file  . Highest education level: Not on file  Occupational History  . Not on file  Social Needs  . Financial resource strain: Not on file  . Food insecurity:    Worry: Not on file    Inability: Not on file  . Transportation needs:    Medical: Not on file    Non-medical: Not on file  Tobacco Use  . Smoking status: Former Smoker    Types: Cigarettes    Last attempt to quit: 05/13/2014    Years since quitting: 4.1  . Smokeless tobacco: Never Used  . Tobacco comment: social   Substance and Sexual Activity  . Alcohol use: Yes    Alcohol/week: 0.0 standard drinks    Comment: rare  . Drug use: Never  . Sexual activity: Not Currently    Comment: -1st intercourse 58 yo-More than 5 partners  Lifestyle  . Physical activity:    Days per week: Not on file    Minutes per session: Not on file  . Stress: Not on file  Relationships  . Social connections:    Talks on phone: Not on file    Gets together: Not on file    Attends religious service: Not on file    Active member of club or organization: Not on file    Attends meetings of clubs or organizations: Not on file    Relationship status: Not on file  . Intimate partner violence:    Fear of current or ex partner: Not on file    Emotionally abused: Not on file    Physically abused: Not on file    Forced sexual activity: Not on file  Other Topics Concern  . Not on file  Social History Narrative  . Not on file    Past Medical History, Surgical history, Social history, and Family history were reviewed and updated as appropriate.   Please see review of systems for further details on the patient's review  from today.   Objective:   Physical Exam:  LMP 12/28/2010   Physical Exam Neurological:     Mental Status: She is alert and oriented to person, place, and time.     Cranial Nerves: No dysarthria.  Psychiatric:        Attention and Perception: Attention normal.        Mood and Affect: Mood is anxious and depressed.        Speech: Speech normal.        Behavior: Behavior is cooperative.        Thought Content: Thought content normal. Thought content is not paranoid or delusional. Thought content does not include homicidal or suicidal ideation. Thought content does not include homicidal or suicidal plan.        Cognition and Memory: Cognition and memory normal.        Judgment: Judgment normal.     Comments: Generally more mood instability with stressors but  no severe mood swings currently.        Lab Review:     Component Value Date/Time   NA 142 08/01/2016 1713   NA 144 07/12/2015 1521   K 4.7 08/01/2016 1713   K 3.8 07/12/2015 1521   CL 107 08/01/2016 1713   CO2 23 08/01/2016 1713   CO2 27 07/12/2015 1521   GLUCOSE 95 08/01/2016 1713   GLUCOSE 82 07/12/2015 1521   BUN 14 08/01/2016 1713   BUN 10.3 07/12/2015 1521   CREATININE 0.87 08/01/2016 1713   CREATININE 0.8 07/12/2015 1521   CALCIUM 10.0 08/01/2016 1713   CALCIUM 9.9 07/12/2015 1521   PROT 7.4 08/01/2016 1713   PROT 7.4 07/12/2015 1521   ALBUMIN 4.8 08/01/2016 1713   ALBUMIN 4.4 07/12/2015 1521   AST 23 08/01/2016 1713   AST 22 07/12/2015 1521   ALT 26 08/01/2016 1713   ALT 31 07/12/2015 1521   ALKPHOS 55 08/01/2016 1713   ALKPHOS 54 07/12/2015 1521   BILITOT 0.7 08/01/2016 1713   BILITOT 0.40 07/12/2015 1521   GFRNONAA 76 08/01/2016 1713   GFRAA 88 08/01/2016 1713       Component Value Date/Time   WBC 5.9 08/01/2016 1713   RBC 4.71 08/01/2016 1713   HGB 14.6 08/01/2016 1713   HGB 14.1 07/12/2015 1521   HCT 43.1 08/01/2016 1713   HCT 41.5 07/12/2015 1521   PLT 254 08/01/2016 1713   PLT 248  07/12/2015 1521   MCV 91.5 08/01/2016 1713   MCV 92.2 07/12/2015 1521   MCH 31.0 08/01/2016 1713   MCHC 33.9 08/01/2016 1713   RDW 13.2 08/01/2016 1713   RDW 13.3 07/12/2015 1521   LYMPHSABS 2,006 08/01/2016 1713   LYMPHSABS 1.8 07/12/2015 1521   MONOABS 354 08/01/2016 1713   MONOABS 0.7 07/12/2015 1521   EOSABS 118 08/01/2016 1713   EOSABS 0.1 07/12/2015 1521   BASOSABS 0 08/01/2016 1713   BASOSABS 0.0 07/12/2015 1521    No results found for: POCLITH, LITHIUM   No results found for: PHENYTOIN, PHENOBARB, VALPROATE, CBMZ   .res Assessment: Plan:    Bipolar II disorder ([place])  PTSD (post-traumatic stress disorder)  Generalized anxiety disorder  Panic disorder with agoraphobia  Mixed obsessional thoughts and acts  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  She is very medication sensitive.  Lamotrigine is the primary mood medication for her at this time and there is a risk of relapse off of the medication.  In fact the risk of relapse is likely but it is a requirement to evaluate her medical condition to wean off the lamotrigine. She has weaned off lamotrigine per oncology.  She has been off of it for3 months or longer.  She has a follow-up appointment with [facility] oncology in May unless it is deferred due to the Covid virus.   Disc risk of mood swings.  Will plan to start Abilify in it's place if she has mood swings.. We will not initiate aripiprazole at this time because the side effects of aripiprazole that are potential at low dosage are similar to withdrawal symptoms from lamotrigine.  Also aripiprazole generally has a mood effective fairly quickly and we can initiate it when needed.  She is having mild mood swings off the lamotrigine but they are not severe and she wants to defer starting any medication for it.  She is working in therapy on being more mindful and other techniques to manage it.  If she has recurrent periods of suicidal  thoughts I will strongly encourage her to start medication.  Has not tolerated many alternatives that have tried.  Disc desipramine and dosing and risk of mood swings but no problems so far.  Try cyclic antidepressants are particularly prone to causing mood swings in people with bipolar disorder.  She is aware of this and is keeping the dosage low in order to minimize this risk it's helped the GI pain and disc the potential benefit for migraine.  Aimovig is helping the HA so far but she's fearful of SE related to it.  Desipramine has not caused mood swings so far.  Seeing Gwen Awl at [facility] twice recently for therapy.  She's dealing with trauma issues on a tolerable pace. Dealing with the anxiety. But can't see therapist enough. Supportive and problem solving and stress management and behavioural techniques discussed including walking outside and exercise and sleep hygiene.  Prefers lorazepam liquid bc faster for severe panic.  This appt was 35 mins.  FU 8 weeks  Lynder Parents, MD, DFAPA            Please see After Visit Summary for patient specific instructions.  Future Appointments  Date Time Provider [place]  03/10/2019  2:00 PM Fontaine, Belinda Block, MD [facility]    No orders of the defined types were placed in this encounter.     -------------------------------

## 2018-07-25 DIAGNOSIS — G43909 Migraine, unspecified, not intractable, without status migrainosus: Secondary | ICD-10-CM | POA: Diagnosis not present

## 2018-07-25 DIAGNOSIS — G5760 Lesion of plantar nerve, unspecified lower limb: Secondary | ICD-10-CM | POA: Diagnosis not present

## 2018-07-25 DIAGNOSIS — K589 Irritable bowel syndrome without diarrhea: Secondary | ICD-10-CM | POA: Diagnosis not present

## 2018-07-25 DIAGNOSIS — M797 Fibromyalgia: Secondary | ICD-10-CM | POA: Diagnosis not present

## 2018-07-25 DIAGNOSIS — J45909 Unspecified asthma, uncomplicated: Secondary | ICD-10-CM | POA: Diagnosis not present

## 2018-07-25 DIAGNOSIS — E7800 Pure hypercholesterolemia, unspecified: Secondary | ICD-10-CM | POA: Diagnosis not present

## 2018-07-25 DIAGNOSIS — J309 Allergic rhinitis, unspecified: Secondary | ICD-10-CM | POA: Diagnosis not present

## 2018-07-25 DIAGNOSIS — Z7189 Other specified counseling: Secondary | ICD-10-CM | POA: Diagnosis not present

## 2018-07-25 DIAGNOSIS — D472 Monoclonal gammopathy: Secondary | ICD-10-CM | POA: Diagnosis not present

## 2018-07-25 DIAGNOSIS — F900 Attention-deficit hyperactivity disorder, predominantly inattentive type: Secondary | ICD-10-CM | POA: Diagnosis not present

## 2018-07-25 DIAGNOSIS — F3181 Bipolar II disorder: Secondary | ICD-10-CM | POA: Diagnosis not present

## 2018-07-26 DIAGNOSIS — F3181 Bipolar II disorder: Secondary | ICD-10-CM | POA: Diagnosis not present

## 2018-08-06 DIAGNOSIS — F3181 Bipolar II disorder: Secondary | ICD-10-CM | POA: Diagnosis not present

## 2018-08-16 DIAGNOSIS — F3181 Bipolar II disorder: Secondary | ICD-10-CM | POA: Diagnosis not present

## 2018-08-28 ENCOUNTER — Ambulatory Visit (INDEPENDENT_AMBULATORY_CARE_PROVIDER_SITE_OTHER): Payer: Medicare Other | Admitting: Psychiatry

## 2018-08-28 ENCOUNTER — Encounter: Payer: Self-pay | Admitting: Psychiatry

## 2018-08-28 DIAGNOSIS — F4310 Post-traumatic stress disorder, unspecified: Secondary | ICD-10-CM | POA: Diagnosis not present

## 2018-08-28 DIAGNOSIS — F902 Attention-deficit hyperactivity disorder, combined type: Secondary | ICD-10-CM | POA: Diagnosis not present

## 2018-08-28 DIAGNOSIS — F4001 Agoraphobia with panic disorder: Secondary | ICD-10-CM | POA: Diagnosis not present

## 2018-08-28 DIAGNOSIS — F411 Generalized anxiety disorder: Secondary | ICD-10-CM | POA: Diagnosis not present

## 2018-08-28 DIAGNOSIS — F3181 Bipolar II disorder: Secondary | ICD-10-CM

## 2018-08-28 NOTE — Progress Notes (Signed)
Danielle Bruce [id number] 04-07-1960 58 y.o.   Virtual Visit via Telephone Note  I connected with pt by telephone and verified that I am speaking with the correct person using two identifiers.   I discussed the limitations, risks, security and privacy concerns of performing an evaluation and management service by telephone and the availability of in person appointments. I also discussed with the patient that there may be a patient responsible charge related to this service. The patient expressed understanding and agreed to proceed.  I discussed the assessment and treatment plan with the patient. The patient was provided an opportunity to ask questions and all were answered. The patient agreed with the plan and demonstrated an understanding of the instructions.   The patient was advised to call back or seek an in-person evaluation if the symptoms worsen or if the condition fails to improve as anticipated.  I provided 30 minutes of non-face-to-face time during this encounter. The call started at 330 and ended at 400. The patient was located at home and the provider was located office.   Subjective:   Patient ID:  Danielle Bruce is a 58 y.o. (DOB 06-27-1960) female.  Chief Complaint:  Chief Complaint  Patient presents with  . Depression  . Anxiety  . Sleeping Problem    HPI  Danielle Bruce presents today for follow up of depression and anxiety  And med changes.  Last visit 07/18/18.  No med changes.   Stays in a lot DT fear of Covid for parents.  Doesn't seem depressed but no energy and sleep as much as possible.  Staying in room a lot.  Tension HA she thinks.  Energy is worse than it was in March and April.  Off allergy shots now and hard to be outside.  Less active.  Not a lot of FM pain.  Not real sad.  Struggling with book reading bc poor concentration and trouble focusing on documentaries.    Has tried Daytrana but can't sleep even if taken off at 3 pm.  Wonders about  Methylphenidate.  At visit in April and taken off lamotrigine DT possible skin reaction.  Had appt with [facility] dermatology and they assume that skin reaction with itching was related to lamotrigine. 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  No further skin eruptions off lamotrigine.  A lot of stressors and losses recently triggered more depression really bad then better and now sort of blah.  Hopeful.  Still seeing therapist only once every 3 weeks and not getting a lot out of it.  Can't see her weekly bc therapist too busy.  Overall pretty good.  Dealing with more emotions off lamotrigine.  More easily irritable.  Some intermittent depression and anxiety.  DT being off the lamotrigine.  Having to work on that in therapy.  Not in a terrible place.  More angry but not severe.  Anxiety situational and variable.  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  Per oncology most likely drug is lamotrigine.  And she weaned off of the medication to determine if the cutaneous lesions are related.    She is well aware that she has been on lamotrigine for many years with good results for depression control.  There is a risk of relapse as she comes off of the medication and she is worried about that.   D had her babies twins, Danielle Bruce and Danielle Bruce born in October.  Pt is  administering PT care for the babies with help from pt's mother.  Babies are well.  Home for 27 days.  Misses grandbabies.  Doing some yard work and walking.    Had bad GI pain again in unusual pattern with .  Put on desipramine 25 and increased to 50 mg 2 weeks ago DT depression and SI and it helped.  Had to reduce desipramine to 25 mg bc was having more constipation.  Desipramine helps stomach pain.    Still using Ativan liquid prn panic.  Less panic lately.  Had to stop driving at night due to vision issues at night.    Delayed sleep-wake cycle  Past psych  meds: Lithium, Zyprexa, valproic acid, Wellbutrin with side effects, Seroquel, Latuda headaches, sertraline irritability, duloxetine, N-acetylcysteine, pramipexole, buspirone side effects, Geodon, carbamazepine, fluvoxamine, methylphenidate, topiramate, risperidone gabapentin, oxcarbazepine, Adderall, Ambien with amnesia, trazodone, Stelazine, fluoxetine, Rexulti, lamotrigine  Review of Systems:  Review of Systems  Gastrointestinal: Positive for abdominal distention.  Musculoskeletal: Positive for back pain.  Neurological: Negative for tremors and weakness.  Psychiatric/Behavioral: Positive for dysphoric mood. Negative for agitation, behavioral problems, confusion, decreased concentration, hallucinations, self-injury, sleep disturbance and suicidal ideas. The patient is not nervous/anxious and is not hyperactive.     Medications: I have reviewed the patient's current medications.  Current Outpatient Medications  Medication Sig Dispense Refill  . albuterol (ACCUNEB) 0.63 MG/3ML nebulizer solution Take 1 ampule by nebulization every 6 (six) hours as needed for wheezing.    Marland Kitchen albuterol (PROVENTIL HFA;VENTOLIN HFA) 108 (90 Base) MCG/ACT inhaler Inhale 2 puffs into the lungs every 6 (six) hours as needed for wheezing or shortness of breath.    . Ascorbic Acid (VITAMIN C) 100 MG tablet Take 100 mg by mouth daily.    Marland Kitchen azelastine (ASTELIN) 0.1 % nasal spray Place into both nostrils 2 (two) times daily. Use in each nostril as directed    . BEPREVE 1.5 % SOLN Place 1 drop into both eyes daily.     . Cholecalciferol (VITAMIN D) 2000 units CAPS Take by mouth.    . desipramine (NORPRAMIN) 25 MG tablet Take 25 mg by mouth daily.     . diphenhydrAMINE (BENADRYL) 25 mg capsule Take 50 mg by mouth every 6 (six) hours as needed (HEADACHES).     Eduard Roux (AIMOVIG) 70 MG/ML SOAJ Inject into the skin.    . fluticasone (FLONASE) 50 MCG/ACT nasal spray Place 2 sprays into both nostrils daily. 16 g 0  .  levocetirizine (XYZAL) 5 MG tablet Take 5 mg by mouth every evening.  5  . LORazepam (ATIVAN) 1 MG tablet Take 1 mg by mouth every 8 (eight) hours as needed for anxiety. (takes 2 mg when having a medical procedure.)    . LORazepam (ATIVAN) 2 MG/ML concentrated solution Take 0.5 mLs (1 mg total) by mouth every 8 (eight) hours as needed for anxiety (severe panic). 30 mL 1  . MELATONIN PO Take 1.5 mg by mouth at bedtime.    . montelukast (SINGULAIR) 10 MG tablet Take 10 mg by mouth at bedtime.    Marland Kitchen PRAVASTATIN SODIUM PO Take 40 mg by mouth.     . rizatriptan (MAXALT) 10 MG tablet Take 10 mg by mouth as needed.   4  . traZODone (DESYREL) 50 MG tablet Take 1 tablet (50 mg total) by mouth at bedtime. 90 tablet 1  . Ubrogepant (UBRELVY) 50 MG TABS Take 50-100 mg by mouth daily as needed.    Marland Kitchen EPINEPHrine  0.3 mg/0.3 mL IJ SOAJ injection as needed.    . hydrOXYzine (ATARAX/VISTARIL) 25 MG tablet TK 1 TO 2 TS PO TID PRF MIGRAINE  4  . L-methylfolate Calcium 15 MG TABS TAKE 1 TABLET BY MOUTH DAILY 30 tablet 5  . oxyCODONE-acetaminophen (PERCOCET/ROXICET) 5-325 MG per tablet Take 2 tablets by mouth daily as needed for moderate pain or severe pain (mighraine.).     Marland Kitchen polyethylene glycol (MIRALAX / GLYCOLAX) packet Take 17 g by mouth daily as needed for mild constipation.     Marland Kitchen PROLIA 60 MG/ML SOSY injection BRING TO THE OFFICE FOR INJECTION ON 02/15/2018 AS DIRECTED ONCE EVERY 6 MONTHS    . promethazine (PHENERGAN) 25 MG tablet Take 25 mg by mouth every 6 (six) hours as needed for nausea.     . risperiDONE (RISPERDAL) 0.25 MG tablet Take 0.25 mg by mouth daily as needed. As needed for an anxiety attack     . VASCEPA 1 g CAPS TK 2 CS PO BID WC  3  . VOLTAREN 1 % GEL Apply 2 g topically 4 (four) times daily as needed (JOINT PAIN).   3   No current facility-administered medications for this visit.     Medication Side Effects: Other: constipation resolved, dry mouth.  Allergies:  Allergies  Allergen  Reactions  . Sulfa Antibiotics Nausea And Vomiting    Causes pt to vomit blood  . Gluten Meal Other (See Comments)    Sensitivity.   . Lactose Intolerance (Gi) Nausea And Vomiting  . Flagyl [Metronidazole Hcl] Nausea And Vomiting  . Morphine And Related Other (See Comments)    Reaction: Depression, emotional    Past Medical History:  Diagnosis Date  . Anemia   . Anxiety   . Arthritis    osteoarthritis  . ASCUS (atypical squamous cells of undetermined significance) on Pap smear 05/06/05   NEG HIGH RISK HPV--C&B BIOPSY BENIGN 12/2005  . Asthma   . Bipolar 2 disorder ([place])   . Cancer ([place])    skin cancer - basal cell  . Colon polyps   . Complication of anesthesia    anxious afterwards, will get headaches   . Constipation   . Depression   . Fibromyalgia 10/2013  . GERD (gastroesophageal reflux disease)   . 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 Heart murmur    never had any problems  . Hemorrhoids   . High cholesterol   . 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   cytology negative  . IBS (irritable bowel syndrome)   . Insomnia   . Lymphocytic colitis   . Lymphoma ([place])   . 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 Migraines   . Osteoarthritis   . Osteopenia   . Peripheral neuropathy   . PTSD (post-traumatic stress disorder)     Family History  Problem Relation Age of Onset  . Diabetes Father   . Hypertension Father   . Hyperlipidemia Father   . Heart disease Maternal Grandfather   . Heart disease Paternal Grandmother   . Heart disease Paternal Grandfather   . Depression Son   . Depression Son   . Anxiety disorder Son   . Depression Daughter   . Anxiety disorder Daughter   . Asthma Daughter     Social History   Socioeconomic History  . Marital status: Divorced    Spouse name: Not on file  . Number of children:  3  . Years of education: Not on file  . Highest education level: Not on file   Occupational History  . Not on file  Social Needs  . Financial resource strain: Not on file  . Food insecurity    Worry: Not on file    Inability: Not on file  . Transportation needs    Medical: Not on file    Non-medical: Not on file  Tobacco Use  . Smoking status: Former Smoker    Types: Cigarettes    Quit date: 05/13/2014    Years since quitting: 4.5  . Smokeless tobacco: Never Used  . Tobacco comment: social   Substance and Sexual Activity  . Alcohol use: Yes    Alcohol/week: 0.0 standard drinks    Comment: rare  . Drug use: Never  . Sexual activity: Not Currently    Comment: -1st intercourse 58 yo-More than 5 partners  Lifestyle  . Physical activity    Days per week: Not on file    Minutes per session: Not on file  . Stress: Not on file  Relationships  . Social Herbalist on phone: Not on file    Gets together: Not on file    Attends religious service: Not on file    Active member of club or organization: Not on file    Attends meetings of clubs or organizations: Not on file    Relationship status: Not on file  . Intimate partner violence    Fear of current or ex partner: Not on file    Emotionally abused: Not on file    Physically abused: Not on file    Forced sexual activity: Not on file  Other Topics Concern  . Not on file  Social History Narrative  . Not on file    Past Medical History, Surgical history, Social history, and Family history were reviewed and updated as appropriate.   Please see review of systems for further details on the patient's review from today.   Objective:   Physical Exam:  LMP 12/28/2010   Physical Exam Neurological:     Mental Status: She is alert and oriented to person, place, and time.     Cranial Nerves: No dysarthria.  Psychiatric:        Attention and Perception: Attention normal.        Mood and Affect: Mood is anxious and depressed.        Speech: Speech normal.        Behavior: Behavior is  cooperative.        Thought Content: Thought content normal. Thought content is not paranoid or delusional. Thought content does not include homicidal or suicidal ideation. Thought content does not include homicidal or suicidal plan.        Cognition and Memory: Cognition and memory normal.        Judgment: Judgment normal.     Comments: Generally more mood instability with stressors but no severe mood swings currently.        Lab Review:     Component Value Date/Time   NA 142 08/01/2016 1713   NA 144 07/12/2015 1521   K 4.7 08/01/2016 1713   K 3.8 07/12/2015 1521   CL 107 08/01/2016 1713   CO2 23 08/01/2016 1713   CO2 27 07/12/2015 1521   GLUCOSE 95 08/01/2016 1713   GLUCOSE 82 07/12/2015 1521   BUN 14 08/01/2016 1713   BUN 10.3 07/12/2015 1521   CREATININE 0.87 08/01/2016  1713   CREATININE 0.8 07/12/2015 1521   CALCIUM 10.0 08/01/2016 1713   CALCIUM 9.9 07/12/2015 1521   PROT 7.4 08/01/2016 1713   PROT 7.4 07/12/2015 1521   ALBUMIN 4.8 08/01/2016 1713   ALBUMIN 4.4 07/12/2015 1521   AST 23 08/01/2016 1713   AST 22 07/12/2015 1521   ALT 26 08/01/2016 1713   ALT 31 07/12/2015 1521   ALKPHOS 55 08/01/2016 1713   ALKPHOS 54 07/12/2015 1521   BILITOT 0.7 08/01/2016 1713   BILITOT 0.40 07/12/2015 1521   GFRNONAA 76 08/01/2016 1713   GFRAA 88 08/01/2016 1713       Component Value Date/Time   WBC 5.9 08/01/2016 1713   RBC 4.71 08/01/2016 1713   HGB 14.6 08/01/2016 1713   HGB 14.1 07/12/2015 1521   HCT 43.1 08/01/2016 1713   HCT 41.5 07/12/2015 1521   PLT 254 08/01/2016 1713   PLT 248 07/12/2015 1521   MCV 91.5 08/01/2016 1713   MCV 92.2 07/12/2015 1521   MCH 31.0 08/01/2016 1713   MCHC 33.9 08/01/2016 1713   RDW 13.2 08/01/2016 1713   RDW 13.3 07/12/2015 1521   LYMPHSABS 2,006 08/01/2016 1713   LYMPHSABS 1.8 07/12/2015 1521   MONOABS 354 08/01/2016 1713   MONOABS 0.7 07/12/2015 1521   EOSABS 118 08/01/2016 1713   EOSABS 0.1 07/12/2015 1521   BASOSABS 0  08/01/2016 1713   BASOSABS 0.0 07/12/2015 1521    No results found for: POCLITH, LITHIUM   No results found for: PHENYTOIN, PHENOBARB, VALPROATE, CBMZ   .res Assessment: Plan:    Hanan was seen today for depression, anxiety and sleeping problem.  Diagnoses and all orders for this visit:  Bipolar II disorder ([place])  PTSD (post-traumatic stress disorder)  Generalized anxiety disorder  Panic disorder with agoraphobia  Attention deficit hyperactivity disorder (ADHD), combined type  Other orders -     Discontinue: methylphenidate (RITALIN) 5 MG tablet; Take 1 tablet (5 mg total) by mouth 2 (two) times daily. (Patient taking differently: Take 5 mg by mouth daily. )   [name] has been on multiple psychiatric medications as noted.  She is very medication sensitive.  Lamotrigine is the primary mood medication for her at this time and there is a risk of relapse off of the medication.  In fact the risk of relapse is likely but it is a requirement to evaluate her medical condition to keep her off the lamotrigine. She has weaned off lamotrigine per oncology.  She has been off of it for3 months or longer.    Enc exercise and push herself.    Stop Daytrana bc insomnia and retry Ritalin 5 mg BID.    Sleep is chronically disturbed.   Disc risk of mood swings.  Will plan to start Abilify in it's place if she has mood swings.. We will not initiate aripiprazole at this time because the side effects of aripiprazole that are potential at low dosage are similar to withdrawal symptoms from lamotrigine.  Also aripiprazole generally has a mood effective fairly quickly and we can initiate it when needed.  She is having mild mood swings off the lamotrigine but they are not severe and she wants to defer starting any medication for it.  She is working in therapy on being more mindful and other techniques to manage it.  If she has recurrent periods of suicidal thoughts I will strongly encourage her to start  medication.  Has not tolerated many alternatives that have tried.  Disc desipramine  and dosing and risk of mood swings but no problems so far.  Option 35 mg for depression since 50 mg causes constipation.  Try cyclic antidepressants are particularly prone to causing mood swings in people with bipolar disorder.  She is aware of this and is keeping the dosage low in order to minimize this risk it's helped the GI pain and disc the potential benefit for migraine.  Aimovig is helping the HA so far but she's fearful of SE related to it.  Desipramine has not caused mood swings so far.  Seeing Gwen Awl at [facility] twice recently for therapy.  She's dealing with trauma issues on a tolerable pace. Dealing with the anxiety. But can't see therapist enough. Supportive and problem solving and stress management and behavioural techniques discussed including walking outside and exercise and sleep hygiene.  Prefers lorazepam liquid bc faster for severe panic.  FU 8 weeks  Lynder Parents, MD, DFAPA            Please see After Visit Summary for patient specific instructions.  Future Appointments  Date Time Provider [place]  12/13/2018  2:00 PM Lelon Perla, MD [facility]  03/10/2019  2:00 PM Fontaine, Belinda Block, MD [facility]    No orders of the defined types were placed in this encounter.     -------------------------------

## 2018-08-29 ENCOUNTER — Other Ambulatory Visit: Payer: Self-pay | Admitting: Psychiatry

## 2018-08-29 ENCOUNTER — Ambulatory Visit: Payer: Medicaid Other | Admitting: Psychiatry

## 2018-08-29 ENCOUNTER — Telehealth: Payer: Self-pay | Admitting: Psychiatry

## 2018-08-29 DIAGNOSIS — F3181 Bipolar II disorder: Secondary | ICD-10-CM | POA: Diagnosis not present

## 2018-08-29 MED ORDER — METHYLPHENIDATE HCL 5 MG PO TABS: 5.0000 mg | ORAL_TABLET | Freq: Two times a day (BID) | ORAL | 0 refills | Status: DC

## 2018-08-29 NOTE — Telephone Encounter (Signed)
Pt stated during her Telehealth visit CC mention starting her on an ADD medication. She would like to start this as soon as possible.

## 2018-08-29 NOTE — Telephone Encounter (Signed)
Sent prescription for Ritalin as requested.  Please notify patient.

## 2018-09-04 DIAGNOSIS — D472 Monoclonal gammopathy: Secondary | ICD-10-CM | POA: Diagnosis not present

## 2018-09-05 DIAGNOSIS — F3181 Bipolar II disorder: Secondary | ICD-10-CM | POA: Diagnosis not present

## 2018-09-10 DIAGNOSIS — M797 Fibromyalgia: Secondary | ICD-10-CM | POA: Diagnosis not present

## 2018-09-10 DIAGNOSIS — J309 Allergic rhinitis, unspecified: Secondary | ICD-10-CM | POA: Diagnosis not present

## 2018-09-10 DIAGNOSIS — F900 Attention-deficit hyperactivity disorder, predominantly inattentive type: Secondary | ICD-10-CM | POA: Diagnosis not present

## 2018-09-10 DIAGNOSIS — R000 Tachycardia, unspecified: Secondary | ICD-10-CM | POA: Diagnosis not present

## 2018-09-10 DIAGNOSIS — Z7189 Other specified counseling: Secondary | ICD-10-CM | POA: Diagnosis not present

## 2018-09-10 DIAGNOSIS — D472 Monoclonal gammopathy: Secondary | ICD-10-CM | POA: Diagnosis not present

## 2018-09-10 DIAGNOSIS — R7301 Impaired fasting glucose: Secondary | ICD-10-CM | POA: Diagnosis not present

## 2018-09-10 DIAGNOSIS — J45909 Unspecified asthma, uncomplicated: Secondary | ICD-10-CM | POA: Diagnosis not present

## 2018-09-10 DIAGNOSIS — F3181 Bipolar II disorder: Secondary | ICD-10-CM | POA: Diagnosis not present

## 2018-09-10 DIAGNOSIS — E7800 Pure hypercholesterolemia, unspecified: Secondary | ICD-10-CM | POA: Diagnosis not present

## 2018-09-12 DIAGNOSIS — F3181 Bipolar II disorder: Secondary | ICD-10-CM | POA: Diagnosis not present

## 2018-09-13 DIAGNOSIS — E7800 Pure hypercholesterolemia, unspecified: Secondary | ICD-10-CM | POA: Diagnosis not present

## 2018-09-13 DIAGNOSIS — R000 Tachycardia, unspecified: Secondary | ICD-10-CM | POA: Diagnosis not present

## 2018-09-13 DIAGNOSIS — R7301 Impaired fasting glucose: Secondary | ICD-10-CM | POA: Diagnosis not present

## 2018-09-26 ENCOUNTER — Other Ambulatory Visit: Payer: Self-pay | Admitting: Psychiatry

## 2018-09-26 DIAGNOSIS — F3181 Bipolar II disorder: Secondary | ICD-10-CM | POA: Diagnosis not present

## 2018-09-27 ENCOUNTER — Telehealth: Payer: Self-pay | Admitting: Cardiology

## 2018-09-27 DIAGNOSIS — M810 Age-related osteoporosis without current pathological fracture: Secondary | ICD-10-CM | POA: Diagnosis not present

## 2018-09-27 NOTE — Telephone Encounter (Signed)

## 2018-09-30 ENCOUNTER — Telehealth: Payer: Medicare Other | Admitting: Cardiology

## 2018-09-30 ENCOUNTER — Telehealth (INDEPENDENT_AMBULATORY_CARE_PROVIDER_SITE_OTHER): Payer: Medicare Other | Admitting: Cardiology

## 2018-09-30 ENCOUNTER — Other Ambulatory Visit: Payer: Self-pay

## 2018-09-30 ENCOUNTER — Encounter: Payer: Self-pay | Admitting: Cardiology

## 2018-09-30 VITALS — BP 108/65 | HR 85 | Ht 62.0 in | Wt 126.0 lb

## 2018-09-30 DIAGNOSIS — R072 Precordial pain: Secondary | ICD-10-CM

## 2018-09-30 DIAGNOSIS — R0602 Shortness of breath: Secondary | ICD-10-CM | POA: Diagnosis not present

## 2018-09-30 DIAGNOSIS — R002 Palpitations: Secondary | ICD-10-CM

## 2018-09-30 NOTE — Progress Notes (Signed)
Virtual Visit via Video Note   This visit type was conducted due to national recommendations for restrictions regarding the COVID-19 Pandemic (e.g. social distancing) in an effort to limit this patient's exposure and mitigate transmission in our community.  Due to her co-morbid illnesses, this patient is at least at moderate risk for complications without adequate follow up.  This format is felt to be most appropriate for this patient at this time.  All issues noted in this document were discussed and addressed.  A limited physical exam was performed with this format.  Please refer to the patient's chart for her consent to telehealth for [facility].   Date:  09/30/2018   ID:  Danielle, Bruce 03/15/1960, MRN [id number]  Patient Location:Home Provider Location: Home  PCP:  Danielle Neer, MD  Cardiologist:  Dr Danielle Bruce  Evaluation Performed:  Consultation - Danielle Bruce was referred by Danielle Neer MD for the evaluation of palpitations.  Chief Complaint:  palpitations  History of Present Illness:    57 year old female for evaluation of palpitations at request of Danielle Neer, MD. patient states that for the past 1 month she has had intermittent elevated heart rate.  It occurs particularly with activities and can increase to 130.  She wore a recent Holter monitor for 48 hours and did have symptoms during that time but those results are not available.  She had one episode of dyspnea on exertion.  She also describes substernal chest pain without radiation.  Not exertional, pleuritic or related to certain movements.  Lasts minutes to 1 hour and resolves.  Cardiology now asked to evaluate.  The patient does not have symptoms concerning for COVID-19 infection (fever, chills, cough, or new shortness of breath).    Past Medical History:  Diagnosis Date   Anemia    Anxiety    Arthritis    osteoarthritis   ASCUS (atypical squamous cells of undetermined significance) on Pap  smear 05/06/05   NEG HIGH RISK HPV--C&B BIOPSY BENIGN 12/2005   Asthma    Bipolar 2 disorder (HCC)    Cancer ([place])    skin cancer - basal cell   Colon polyps    Complication of anesthesia    anxious afterwards, will get headaches    Constipation    Depression    Fibromyalgia 10/2013   GERD (gastroesophageal reflux disease)    Hearing loss on left    Heart murmur    never had any problems   Hemorrhoids    High cholesterol    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   cytology negative   IBS (irritable bowel syndrome)    Insomnia    Lymphocytic colitis    Lymphoma (HCC)    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   Bone marrow biopsy showes 8% plasma cells IgA Lambda   Migraines    Osteoarthritis    Osteopenia    Peripheral neuropathy    PTSD (post-traumatic stress disorder)    Past Surgical History:  Procedure Laterality Date   BONE MARROW BIOPSY Left 12/18/2013   Plasma cell dyscrasia 8% population of plasma cells   BREAST BIOPSY Right    benign stereo   CESAREAN SECTION  00,92,33   CHOLECYSTECTOMY N/A 10/16/2012   Procedure: LAPAROSCOPIC CHOLECYSTECTOMY;  Surgeon: Joyice Faster. Cornett, MD;  Location: [facility];  Service: General;  Laterality: N/A;   COLONOSCOPY     numerous times   DILATION AND CURETTAGE OF UTERUS     ESOPHAGOGASTRODUODENOSCOPY  HEMORRHOID SURGERY  1993   x3   IUD REMOVAL  02/2015   Mirena   LAPAROSCOPIC LYSIS OF ADHESIONS N/A 10/16/2012   Procedure: LAPAROSCOPIC LYSIS OF ADHESIONS;  Surgeon: Marcello Moores A. Cornett, MD;  Location: [facility];  Service: General;  Laterality: N/A;   LAPAROSCOPY N/A 10/16/2012   Procedure: LAPAROSCOPY DIAGNOSTIC;  Surgeon: Joyice Faster. Cornett, MD;  Location[facility];  Service: General;  Laterality: N/A;   PELVIC LAPAROSCOPY     RADIOLOGY WITH ANESTHESIA N/A 12/16/2015   Procedure: MRI OF BRAIN WITH AND WITHOUT CONTRAST;  Surgeon: Medication Radiologist, MD;  Location: [place];   Service: Radiology;  Laterality: N/A;   SHOULDER SURGERY  2007/2008   SPINE SURGERY  2010   cervical     Current Meds  Medication Sig   albuterol (ACCUNEB) 0.63 MG/3ML nebulizer solution Take 1 ampule by nebulization every 6 (six) hours as needed for wheezing.   albuterol (PROVENTIL HFA;VENTOLIN HFA) 108 (90 Base) MCG/ACT inhaler Inhale 2 puffs into the lungs every 6 (six) hours as needed for wheezing or shortness of breath.   Ascorbic Acid (VITAMIN C) 100 MG tablet Take 100 mg by mouth daily.   azelastine (ASTELIN) 0.1 % nasal spray Place into both nostrils 2 (two) times daily. Use in each nostril as directed   BEPREVE 1.5 % SOLN Place 1 drop into both eyes daily.    Calcium Carbonate-Vitamin D (CALCIUM + D PO) Take 1 tablet by mouth daily.   Cholecalciferol (VITAMIN D) 2000 units CAPS Take by mouth.   desipramine (NORPRAMIN) 25 MG tablet Take 25 mg by mouth daily.    diphenhydrAMINE (BENADRYL) 25 mg capsule Take 50 mg by mouth every 6 (six) hours as needed (HEADACHES).    EPINEPHrine 0.3 mg/0.3 mL IJ SOAJ injection as needed.   Erenumab-aooe (AIMOVIG) 70 MG/ML SOAJ Inject into the skin.   fluticasone (FLONASE) 50 MCG/ACT nasal spray Place 2 sprays into both nostrils daily.   hydrOXYzine (ATARAX/VISTARIL) 25 MG tablet TK 1 TO 2 TS PO TID PRF MIGRAINE   L-methylfolate Calcium 15 MG TABS TAKE 1 TABLET BY MOUTH DAILY   levocetirizine (XYZAL) 5 MG tablet Take 5 mg by mouth every evening.   LORazepam (ATIVAN) 1 MG tablet Take 1 mg by mouth every 8 (eight) hours as needed for anxiety. (takes 2 mg when having a medical procedure.)   LORazepam (ATIVAN) 2 MG/ML concentrated solution Take 0.5 mLs (1 mg total) by mouth every 8 (eight) hours as needed for anxiety (severe panic).   MELATONIN PO Take 1.5 mg by mouth at bedtime.   montelukast (SINGULAIR) 10 MG tablet Take 10 mg by mouth at bedtime.   oxyCODONE-acetaminophen (PERCOCET/ROXICET) 5-325 MG per tablet Take 2 tablets by  mouth daily as needed for moderate pain or severe pain (mighraine.).    polyethylene glycol (MIRALAX / GLYCOLAX) packet Take 17 g by mouth daily as needed for mild constipation.    PRAVASTATIN SODIUM PO Take 40 mg by mouth.    PRESCRIPTION MEDICATION Inject 3 each into the skin every 7 (seven) days. Allergy shot for Cats.    PROLIA 60 MG/ML SOSY injection BRING TO THE OFFICE FOR INJECTION ON 02/15/2018 AS DIRECTED ONCE EVERY 6 MONTHS   promethazine (PHENERGAN) 25 MG tablet Take 25 mg by mouth every 6 (six) hours as needed for nausea.    risperiDONE (RISPERDAL) 0.25 MG tablet Take 0.25 mg by mouth daily as needed. As needed for an anxiety attack    rizatriptan (MAXALT) 10  MG tablet Take 10 mg by mouth as needed.    traZODone (DESYREL) 50 MG tablet Take 1 tablet (50 mg total) by mouth at bedtime.   Ubrogepant (UBRELVY) 50 MG TABS Take 50-100 mg by mouth daily as needed.   VASCEPA 1 g CAPS TK 2 CS PO BID WC   VOLTAREN 1 % GEL Apply 2 g topically 4 (four) times daily as needed (JOINT PAIN).      Allergies:   Sulfa antibiotics, Gluten meal, Lactose intolerance (gi), Flagyl [metronidazole hcl], and Morphine and related   Social History   Tobacco Use   Smoking status: Former Smoker    Types: Cigarettes    Quit date: 05/13/2014    Years since quitting: 4.3   Smokeless tobacco: Never Used   Tobacco comment: social   Substance Use Topics   Alcohol use: Yes    Alcohol/week: 0.0 standard drinks    Comment: rare   Drug use: Never     Family Hx: The patient's family history includes Anxiety disorder in her daughter and son; Asthma in her daughter; Depression in her daughter, son, and son; Diabetes in her father; Heart disease in her maternal grandfather, paternal grandfather, and paternal grandmother; Hyperlipidemia in her father; Hypertension in her father.  ROS:   Please see the history of present illness.    Fibromyalgia but no chills  or productive cough All other systems  reviewed and are negative.  Wt Readings from Last 3 Encounters:  09/30/18 126 lb (57.2 kg)  03/06/18 138 lb (62.6 kg)  06/29/17 130 lb (59 kg)     Objective:    Vital Signs:  BP 108/65    Pulse 85    Ht 5' 2"  (1.575 m)    Wt 126 lb (57.2 kg)    LMP 12/28/2010    BMI 23.05 kg/m    VITAL SIGNS:  reviewed NAD Answers questions appropriately Normal affect Remainder of physical examination not performed (telehealth visit; coronavirus pandemic)  ASSESSMENT & PLAN:    1. Palpitations-etiology unclear.  She did wear a recent Holter monitor and had symptoms during that time.  I will obtain those results for review.  Schedule echocardiogram to assess LV function.  She also had blood work done recently by her primary care and we will ask for those results including TSH. 2. Chest pain-symptoms are atypical in description.  We will have a baseline electrocardiogram performed.  If normal I will arrange an exercise treadmill for risk stratification and also to evaluate heart rate with activities. 3. Dyspnea-some dyspnea on exertion.  Echocardiogram to assess LV function. 4. Hyperlipidemia-continue statin.  COVID-19 Education: The importance of social distancing was discussed today.  Time:   Today, I have spent 30 minutes with the patient with telehealth technology discussing the above problems.     Medication Adjustments/Labs and Tests Ordered: Current medicines are reviewed at length with the patient today.  Concerns regarding medicines are outlined above.   Tests Ordered: No orders of the defined types were placed in this encounter.   Medication Changes: No orders of the defined types were placed in this encounter.   Follow Up:  Virtual Visit or In Person in 8 week(s)  Signed, Kirk Ruths, MD  09/30/2018 8:51 AM    [place]

## 2018-09-30 NOTE — Patient Instructions (Signed)
Medication Instructions:  NO CHANGE If you need a refill on your cardiac medications before your next appointment, please call your pharmacy.   Lab work: If you have labs (blood work) drawn today and your tests are completely normal, you will receive your results only by: Marland Kitchen MyChart Message (if you have MyChart) OR . A paper copy in the mail If you have any lab test that is abnormal or we need to change your treatment, we will call you to review the results.  Testing/Procedures: Your physician has requested that you have an echocardiogram. Echocardiography is a painless test that uses sound waves to create images of your heart. It provides your doctor with information about the size and shape of your heart and how well your heart's chambers and valves are working. This procedure takes approximately one hour. There are no restrictions for this procedure. [place] OFFICE    Follow-Up: At [facility], you and your health needs are our priority.  As part of our continuing mission to provide you with exceptional heart care, we have created designated Provider Care Teams.  These Care Teams include your primary Cardiologist (physician) and Advanced Practice Providers (APPs -  Physician Assistants and Nurse Practitioners) who all work together to provide you with the care you need, when you need it. . Your physician recommends that you schedule a follow-up appointment in: [place]

## 2018-10-03 DIAGNOSIS — F3181 Bipolar II disorder: Secondary | ICD-10-CM | POA: Diagnosis not present

## 2018-10-07 ENCOUNTER — Ambulatory Visit (HOSPITAL_COMMUNITY): Payer: Medicare Other | Attending: Cardiology

## 2018-10-07 ENCOUNTER — Other Ambulatory Visit: Payer: Self-pay

## 2018-10-07 DIAGNOSIS — R0602 Shortness of breath: Secondary | ICD-10-CM | POA: Diagnosis not present

## 2018-10-07 DIAGNOSIS — R072 Precordial pain: Secondary | ICD-10-CM | POA: Diagnosis not present

## 2018-10-07 DIAGNOSIS — R002 Palpitations: Secondary | ICD-10-CM | POA: Diagnosis not present

## 2018-10-11 ENCOUNTER — Other Ambulatory Visit: Payer: Self-pay

## 2018-10-11 ENCOUNTER — Telehealth (INDEPENDENT_AMBULATORY_CARE_PROVIDER_SITE_OTHER): Payer: Medicare Other | Admitting: Cardiology

## 2018-10-11 VITALS — BP 105/65 | HR 82 | Temp 96.3°F | Ht 62.0 in | Wt 126.6 lb

## 2018-10-11 DIAGNOSIS — E7800 Pure hypercholesterolemia, unspecified: Secondary | ICD-10-CM

## 2018-10-11 DIAGNOSIS — R072 Precordial pain: Secondary | ICD-10-CM | POA: Diagnosis not present

## 2018-10-11 DIAGNOSIS — R002 Palpitations: Secondary | ICD-10-CM | POA: Diagnosis not present

## 2018-10-11 NOTE — Patient Instructions (Signed)
Medication Instructions:  NO CHANGE If you need a refill on your cardiac medications before your next appointment, please call your pharmacy.   Lab work: If you have labs (blood work) drawn today and your tests are completely normal, you will receive your results only by: Marland Kitchen MyChart Message (if you have MyChart) OR . A paper copy in the mail If you have any lab test that is abnormal or we need to change your treatment, we will call you to review the results.  Follow-Up: At [facility], you and your health needs are our priority.  As part of our continuing mission to provide you with exceptional heart care, we have created designated Provider Care Teams.  These Care Teams include your primary Cardiologist (physician) and Advanced Practice Providers (APPs -  Physician Assistants and Nurse Practitioners) who all work together to provide you with the care you need, when you need it. Your physician recommends that you schedule a follow-up appointment in: October AS SCHEDULED

## 2018-10-11 NOTE — Progress Notes (Signed)
Virtual Visit via Video Note   This visit type was conducted due to national recommendations for restrictions regarding the COVID-19 Pandemic (e.g. social distancing) in an effort to limit this patient's exposure and mitigate transmission in our community.  Due to her co-morbid illnesses, this patient is at least at moderate risk for complications without adequate follow up.  This format is felt to be most appropriate for this patient at this time.  All issues noted in this document were discussed and addressed.  A limited physical exam was performed with this format.  Please refer to the patient's chart for her consent to telehealth for [facility].   Date:  10/11/2018   ID:  Danielle Bruce, DOB [birth year], MRN [id number]  Patient Location:Home Provider Location: Home  PCP:  Mayra Neer, MD  Cardiologist:  Dr Stanford Breed  Evaluation Performed:  Follow-Up Visit  Chief Complaint:  FU Palpitations  History of Present Illness:    FU palpitations.  Holter monitor July 2020 showed sinus bradycardia, normal sinus rhythm, sinus tachycardia and rare PVC.  Echocardiogram August 2020 showed normal LV function and mild diastolic dysfunction.  Since last seen patient continues to complain of increased heart rate.  She also has chest pressure that occurs both at rest and with exertion.  No syncope.  Occasional dizziness.  The patient does not have symptoms concerning for COVID-19 infection (fever, chills, cough, or new shortness of breath).    Past Medical History:  Diagnosis Date  . Anemia   . Anxiety   . Arthritis    osteoarthritis  . ASCUS (atypical squamous cells of undetermined significance) on Pap smear 05/06/05   NEG HIGH RISK HPV--C&B BIOPSY BENIGN 12/2005  . Asthma   . Bipolar 2 disorder ([place])   . Cancer ([place])    skin cancer - basal cell  . Colon polyps   . Complication of anesthesia    anxious afterwards, will get headaches   . Constipation   . Depression   . Fibromyalgia  10/2013  . GERD (gastroesophageal reflux disease)   . 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 Heart murmur    never had any problems  . Hemorrhoids   . High cholesterol   . 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   cytology negative  . IBS (irritable bowel syndrome)   . Insomnia   . Lymphocytic colitis   . Lymphoma ([place])   . 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 Migraines   . Osteoarthritis   . Osteopenia   . Peripheral neuropathy   . PTSD (post-traumatic stress disorder)    Past Surgical History:  Procedure Laterality Date  . BONE MARROW BIOPSY Left 12/18/2013   Plasma cell dyscrasia 8% population of plasma cells  . BREAST BIOPSY Right    benign stereo  . CESAREAN SECTION  [phone]  . CHOLECYSTECTOMY N/A 10/16/2012   Procedure: LAPAROSCOPIC CHOLECYSTECTOMY;  Surgeon: Joyice Faster. Cornett, MD;  Location: [facility];  Service: General;  Laterality: N/A;  . COLONOSCOPY     numerous times  . DILATION AND CURETTAGE OF UTERUS    . ESOPHAGOGASTRODUODENOSCOPY    . [place]   x3  . IUD REMOVAL  02/2015   Mirena  . LAPAROSCOPIC LYSIS OF ADHESIONS N/A 10/16/2012   Procedure: LAPAROSCOPIC LYSIS OF ADHESIONS;  Surgeon: Joyice Faster. Cornett, MD;  [facility];  Service: General;  Laterality: N/A;  . LAPAROSCOPY  N/A 10/16/2012   Procedure: LAPAROSCOPY DIAGNOSTIC;  Surgeon: Joyice Faster. Cornett, MD;  [facility];  Service: General;  Laterality: N/A;  . PELVIC LAPAROSCOPY    . RADIOLOGY WITH ANESTHESIA N/A 12/16/2015   Procedure: MRI OF BRAIN WITH AND WITHOUT CONTRAST;  Surgeon: Medication Radiologist, MD;  Location: [place];  Service: Radiology;  Laterality: N/A;  . SHOULDER SURGERY  2007/2008  . SPINE SURGERY  2010   cervical     No outpatient medications have been marked as taking for the 10/11/18 encounter (Telemedicine) with Lelon Perla, MD.     Allergies:   Sulfa antibiotics, Gluten meal, Lactose  intolerance (gi), Flagyl [metronidazole hcl], and Morphine and related   Social History   Tobacco Use  . Smoking status: Former Smoker    Types: Cigarettes    Quit date: 05/13/2014    Years since quitting: 4.4  . Smokeless tobacco: Never Used  . Tobacco comment: social   Substance Use Topics  . Alcohol use: Yes    Alcohol/week: 0.0 standard drinks    Comment: rare  . Drug use: Never     Family Hx: The patient's family history includes Anxiety disorder in her daughter and son; Asthma in her daughter; Depression in her daughter, son, and son; Diabetes in her father; Heart disease in her maternal grandfather, paternal grandfather, and paternal grandmother; Hyperlipidemia in her father; Hypertension in her father.  ROS:   Please see the history of present illness.    No Fever, chills  or productive cough All other systems reviewed and are negative.   Wt Readings from Last 3 Encounters:  10/11/18 126 lb 9.6 oz (57.4 kg)  09/30/18 126 lb (57.2 kg)  03/06/18 138 lb (62.6 kg)     Objective:    Vital Signs:  BP 105/65   Pulse 82   Temp (!) 96.3 F (35.7 C)   Ht '5\' 2"'$  (1.575 m)   Wt 126 lb 9.6 oz (57.4 kg)   LMP 12/28/2010   BMI 23.16 kg/m    VITAL SIGNS:  reviewed NAD Answers questions appropriately Normal affect Remainder of physical examination not performed (telehealth visit; coronavirus pandemic)  ASSESSMENT & PLAN:    1. Palpitations-patient continues to describe palpitations.  She wore a Holter monitor in July and states she did have symptoms with the monitor on.  This revealed sinus bradycardia, normal sinus rhythm, sinus tachycardia and rare PVC.  Echocardiogram shows normal LV function.  We are awaiting TSH recently drawn by primary care.  We discussed adding Toprol at low-dose for symptoms but she would like conservative therapy for now.  We will consider in the future if needed. 2. Chest pain-symptoms are atypical.  We will arrange to have an electrocardiogram  performed in the office.  If normal I will arrange an exercise treadmill. 3. Hyperlipidemia-continue statin.  COVID-19 Education: The importance of social distancing was discussed today.  Time:   Today, I have spent 18 minutes with the patient with telehealth technology discussing the above problems.     Medication Adjustments/Labs and Tests Ordered: Current medicines are reviewed at length with the patient today.  Concerns regarding medicines are outlined above.   Tests Ordered: No orders of the defined types were placed in this encounter.   Medication Changes: No orders of the defined types were placed in this encounter.   Follow Up:  Virtual Visit or In Person October as scheduled  Signed, Kirk Ruths, MD  10/11/2018 8:24 AM    [facility]

## 2018-10-14 ENCOUNTER — Ambulatory Visit (INDEPENDENT_AMBULATORY_CARE_PROVIDER_SITE_OTHER): Payer: Medicare Other | Admitting: *Deleted

## 2018-10-14 ENCOUNTER — Other Ambulatory Visit: Payer: Self-pay

## 2018-10-14 DIAGNOSIS — R072 Precordial pain: Secondary | ICD-10-CM

## 2018-10-14 NOTE — Progress Notes (Signed)
  1.) Reason for visit: EKG to determine if the patient will have a treadmill test  2.) Name of MD requesting visit: Dr. Stanford Breed  3.) ROS related to problem: Patient had complaints of chest pain  4.) Assessment and plan per MD: Per DOD, the EKG was normal. The note will be routed to the ordering provider. The patient did say that the pain is better but she still feels palpitations at times.

## 2018-10-17 DIAGNOSIS — F3181 Bipolar II disorder: Secondary | ICD-10-CM | POA: Diagnosis not present

## 2018-10-18 ENCOUNTER — Telehealth: Payer: Self-pay | Admitting: *Deleted

## 2018-10-18 ENCOUNTER — Encounter: Payer: Self-pay | Admitting: *Deleted

## 2018-10-18 DIAGNOSIS — R072 Precordial pain: Secondary | ICD-10-CM

## 2018-10-18 NOTE — Telephone Encounter (Signed)
Spoke with pt, nuclear testing scheduled. Instruction letter mailed to the patient.

## 2018-10-18 NOTE — Telephone Encounter (Signed)
-----   Message from Lelon Perla, MD sent at 10/18/2018  8:20 AM EDT ----- ECG CRO inferior MI; schedule lexiscan nuclear study Gwendolyn Fill ----- Message ----- From: Cristopher Estimable, RN Sent: 10/18/2018   7:57 AM EDT To: Lelon Perla, MD  Ekg in chart for review ? gxt

## 2018-10-21 ENCOUNTER — Telehealth: Payer: Self-pay | Admitting: Cardiology

## 2018-10-21 ENCOUNTER — Telehealth (HOSPITAL_COMMUNITY): Payer: Self-pay | Admitting: *Deleted

## 2018-10-21 NOTE — Telephone Encounter (Signed)
Pt c/o of Chest Pain: STAT if CP now or developed within 24 hours  1. Are you having CP right now? Some chest pressure, with left arm pain, just a few minutes ago   2. Are you experiencing any other symptoms (ex. SOB, nausea, vomiting, sweating)? No  3. How long have you been experiencing CP? Patient states she is not having chest pain, just some chest pressure.    4. Is your CP continuous or coming and going? No   5. Have you taken Nitroglycerin? No  ?

## 2018-10-21 NOTE — Telephone Encounter (Signed)
Agree with plan Danielle Bruce  

## 2018-10-21 NOTE — Telephone Encounter (Signed)
Pt called to report that she has been having chest pressure radiating to her left arm for the past 40 minutes. She denies sob, dizziness.... but says that she has a history of fibromyalgia and spinal fusion. She says she has had pain in her neck that seems to be similiar to what she has had before.. she does not think cardiac but her Mom asked her to call us. She is having a stress test 10/23/18 but I advised her that if her pain continues or worsen she should go to the ER... she says that she is very uncomfortable with going since she has been in quarantine for the last several months.. she is very nervous about getting COVID.Marland KitchenMarland Kitchen I talked with her about the benefit/need for being treated if she needs acute care.   Pt is using her TENS unit and reports that it has helped some... will forward to Dr. Stanford Breed for his review.

## 2018-10-21 NOTE — Telephone Encounter (Signed)
Patient given detailed instructions per Myocardial Perfusion Study Information Sheet for the test on 10/23/18. Patient notified to arrive 15 minutes early and that it is imperative to arrive on time for appointment to keep from having the test rescheduled.  If you need to cancel or reschedule your appointment, please call the office within 24 hours of your appointment. . Patient verbalized understanding. Danielle Bruce    

## 2018-10-22 ENCOUNTER — Other Ambulatory Visit: Payer: Self-pay | Admitting: Gynecology

## 2018-10-22 DIAGNOSIS — Z7189 Other specified counseling: Secondary | ICD-10-CM | POA: Diagnosis not present

## 2018-10-22 DIAGNOSIS — F3181 Bipolar II disorder: Secondary | ICD-10-CM | POA: Diagnosis not present

## 2018-10-22 DIAGNOSIS — E7800 Pure hypercholesterolemia, unspecified: Secondary | ICD-10-CM | POA: Diagnosis not present

## 2018-10-22 DIAGNOSIS — R7301 Impaired fasting glucose: Secondary | ICD-10-CM | POA: Diagnosis not present

## 2018-10-22 DIAGNOSIS — Z1231 Encounter for screening mammogram for malignant neoplasm of breast: Secondary | ICD-10-CM

## 2018-10-22 DIAGNOSIS — R000 Tachycardia, unspecified: Secondary | ICD-10-CM | POA: Diagnosis not present

## 2018-10-22 DIAGNOSIS — M797 Fibromyalgia: Secondary | ICD-10-CM | POA: Diagnosis not present

## 2018-10-22 DIAGNOSIS — K589 Irritable bowel syndrome without diarrhea: Secondary | ICD-10-CM | POA: Diagnosis not present

## 2018-10-22 DIAGNOSIS — D472 Monoclonal gammopathy: Secondary | ICD-10-CM | POA: Diagnosis not present

## 2018-10-22 DIAGNOSIS — G43909 Migraine, unspecified, not intractable, without status migrainosus: Secondary | ICD-10-CM | POA: Diagnosis not present

## 2018-10-22 DIAGNOSIS — F900 Attention-deficit hyperactivity disorder, predominantly inattentive type: Secondary | ICD-10-CM | POA: Diagnosis not present

## 2018-10-22 DIAGNOSIS — J309 Allergic rhinitis, unspecified: Secondary | ICD-10-CM | POA: Diagnosis not present

## 2018-10-23 ENCOUNTER — Ambulatory Visit (HOSPITAL_COMMUNITY): Payer: Medicare Other | Attending: Cardiology

## 2018-10-23 ENCOUNTER — Encounter (HOSPITAL_COMMUNITY): Payer: Self-pay

## 2018-10-23 ENCOUNTER — Other Ambulatory Visit: Payer: Self-pay

## 2018-10-23 DIAGNOSIS — R072 Precordial pain: Secondary | ICD-10-CM | POA: Diagnosis not present

## 2018-10-23 LAB — MYOCARDIAL PERFUSION IMAGING
LV dias vol: 55 mL (ref 46–106)
LV sys vol: 17 mL
Peak HR: 125 {beats}/min
Rest HR: 77 {beats}/min
SDS: 0
SRS: 0
SSS: 0
TID: 0.96

## 2018-10-23 MED ORDER — REGADENOSON 0.4 MG/5ML IV SOLN
0.4000 mg | Freq: Once | INTRAVENOUS | Status: AC
  Administered 2018-10-23: 0.4 mg via INTRAVENOUS

## 2018-10-23 MED ORDER — TECHNETIUM TC 99M TETROFOSMIN IV KIT
30.6000 | PACK | Freq: Once | INTRAVENOUS | Status: AC | PRN
  Administered 2018-10-23: 30.6 via INTRAVENOUS
  Filled 2018-10-23: qty 31

## 2018-10-23 MED ORDER — TECHNETIUM TC 99M TETROFOSMIN IV KIT
10.7000 | PACK | Freq: Once | INTRAVENOUS | Status: AC | PRN
  Administered 2018-10-23: 10.7 via INTRAVENOUS
  Filled 2018-10-23: qty 11

## 2018-10-24 DIAGNOSIS — F3181 Bipolar II disorder: Secondary | ICD-10-CM | POA: Diagnosis not present

## 2018-10-31 ENCOUNTER — Other Ambulatory Visit: Payer: Self-pay

## 2018-10-31 ENCOUNTER — Ambulatory Visit (INDEPENDENT_AMBULATORY_CARE_PROVIDER_SITE_OTHER): Payer: Medicare Other | Admitting: Psychiatry

## 2018-10-31 ENCOUNTER — Encounter: Payer: Self-pay | Admitting: Psychiatry

## 2018-10-31 DIAGNOSIS — F3181 Bipolar II disorder: Secondary | ICD-10-CM | POA: Diagnosis not present

## 2018-10-31 DIAGNOSIS — F422 Mixed obsessional thoughts and acts: Secondary | ICD-10-CM | POA: Diagnosis not present

## 2018-10-31 DIAGNOSIS — F4001 Agoraphobia with panic disorder: Secondary | ICD-10-CM | POA: Diagnosis not present

## 2018-10-31 DIAGNOSIS — F902 Attention-deficit hyperactivity disorder, combined type: Secondary | ICD-10-CM

## 2018-10-31 DIAGNOSIS — F411 Generalized anxiety disorder: Secondary | ICD-10-CM

## 2018-10-31 DIAGNOSIS — F40241 Acrophobia: Secondary | ICD-10-CM | POA: Diagnosis not present

## 2018-10-31 DIAGNOSIS — F4310 Post-traumatic stress disorder, unspecified: Secondary | ICD-10-CM | POA: Diagnosis not present

## 2018-10-31 NOTE — Progress Notes (Signed)
Danielle Bruce [id number] 1960-09-23 58 y.o.  Virtual Visit via Webex  I connected with pt on 10/31/18 at 11:00 AM EDT by Webex and verified that I am speaking with the correct person using two identifiers.   I discussed the limitations, risks, security and privacy concerns of performing an evaluation and management service by Webex and the availability of in person appointments. I also discussed with the patient that there may be a patient responsible charge related to this service. The patient expressed understanding and agreed to proceed.   I discussed the assessment and treatment plan with the patient. The patient was provided an opportunity to ask questions and all were answered. The patient agreed with the plan and demonstrated an understanding of the instructions.   The patient was advised to call back or seek an in-person evaluation if the symptoms worsen or if the condition fails to improve as anticipated.  I provided 40 minutes of non-face-to-face time during this encounter.  The patient was located at home.  The provider was located at [place].   Purnell Shoemaker, MD   Subjective:   Patient ID:  Danielle Bruce is a 58 y.o. (DOB 03-Dec-1960) female.  Chief Complaint:  Chief Complaint  Patient presents with  . Follow-up    Medication Management  . Depression    Medication Management  . Anxiety    Medication Management  . Other    PTSD    HPI Danielle Bruce presents for follow-up of multiple dxes.  visit in April and taken off lamotrigine DT possible skin reaction.  Had appt with [facility] dermatology and they assume that skin reaction with itching was related to lamotrigine. 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 Per oncology most likely drug is lamotrigine. And she weaned off of the medication to determine if the cutaneous  lesions are related. Lesions seem less without lamotrigine.  At this point skin reaction is presumed related to lamotrigine.  No other disease sx. She is well aware that she has been on lamotrigine for many years with good results for depression control. There is a risk of relapse as she comes off of the medication and she is worried about that. Reduced desipramine bc GI complications but it does help the pain if properly dosed.  Worse paiin without it quickly.  Questions about dosing.  Phone helps her cognitive  And other productivity.  Restarted low dosage ADD bc concerns over STM, concentration and productivity.    No Risperidone since last visit. Ativan only when having medical issues.  Had cardiology evaluation and needed Ativan.  Everything was clear in work up.  Had panic with procedure that brought back trauma issues and working in therapy on it. Therapy working on trauma issues is very hard.  Struggling with the fact she doesn't have memory of large parts of her past.    No manic sx off mood stabilizers.  Covid isolation reduce stressors overall and not prominently depressed.    Patient reports stable mood and denies depressed or irritable moods.  Patient reports panic and recent difficulty with anxiety.  This caught off guard.  Recent flashbacks.   Patient denies difficulty with sleep initiation or maintenance. Denies appetite disturbance.  Patient reports that energy and motivation have been good.  Patient denies any difficulty with concentration.  Patient denies any suicidal ideation.   Past psych meds: Lithium,  Zyprexa, valproic acid, Wellbutrin with side effects, Seroquel, Latuda headaches, sertraline irritability, duloxetine, N-acetylcysteine, pramipexole, buspirone side effects, Geodon, carbamazepine, fluvoxamine, methylphenidate, topiramate, risperidone gabapentin, oxcarbazepine, Adderall, Ambien with amnesia, trazodone, Stelazine, fluoxetine, Rexulti, lamotrigine  Review of Systems:   Review of Systems  Gastrointestinal: Positive for abdominal distention and abdominal pain.  Neurological: Positive for dizziness and headaches. Negative for tremors and weakness.  Psychiatric/Behavioral: Negative for agitation, behavioral problems, confusion, dysphoric mood, hallucinations, self-injury and suicidal ideas. The patient is nervous/anxious.   Less HA with new meds.  Medications: I have reviewed the patient's current medications.  Current Outpatient Medications  Medication Sig Dispense Refill  . albuterol (ACCUNEB) 0.63 MG/3ML nebulizer solution Take 1 ampule by nebulization every 6 (six) hours as needed for wheezing.    Marland Kitchen albuterol (PROVENTIL HFA;VENTOLIN HFA) 108 (90 Base) MCG/ACT inhaler Inhale 2 puffs into the lungs every 6 (six) hours as needed for wheezing or shortness of breath.    . Ascorbic Acid (VITAMIN C) 100 MG tablet Take 100 mg by mouth daily.    Marland Kitchen azelastine (ASTELIN) 0.1 % nasal spray Place into both nostrils 2 (two) times daily. Use in each nostril as directed    . BEPREVE 1.5 % SOLN Place 1 drop into both eyes daily.     . Cholecalciferol (VITAMIN D) 2000 units CAPS Take by mouth.    . desipramine (NORPRAMIN) 25 MG tablet Take 25 mg by mouth daily.     . diphenhydrAMINE (BENADRYL) 25 mg capsule Take 50 mg by mouth every 6 (six) hours as needed (HEADACHES).     Marland Kitchen EPINEPHrine 0.3 mg/0.3 mL IJ SOAJ injection as needed.    Eduard Roux (AIMOVIG) 70 MG/ML SOAJ Inject into the skin.    . fluticasone (FLONASE) 50 MCG/ACT nasal spray Place 2 sprays into both nostrils daily. 16 g 0  . hydrOXYzine (ATARAX/VISTARIL) 25 MG tablet TK 1 TO 2 TS PO TID PRF MIGRAINE  4  . L-methylfolate Calcium 15 MG TABS TAKE 1 TABLET BY MOUTH DAILY 30 tablet 5  . levocetirizine (XYZAL) 5 MG tablet Take 5 mg by mouth every evening.  5  . LORazepam (ATIVAN) 1 MG tablet Take 1 mg by mouth every 8 (eight) hours as needed for anxiety. (takes 2 mg when having a medical procedure.)    .  LORazepam (ATIVAN) 2 MG/ML concentrated solution Take 0.5 mLs (1 mg total) by mouth every 8 (eight) hours as needed for anxiety (severe panic). 30 mL 1  . MELATONIN PO Take 1.5 mg by mouth at bedtime.    . montelukast (SINGULAIR) 10 MG tablet Take 10 mg by mouth at bedtime.    Marland Kitchen oxyCODONE-acetaminophen (PERCOCET/ROXICET) 5-325 MG per tablet Take 2 tablets by mouth daily as needed for moderate pain or severe pain (mighraine.).     Marland Kitchen polyethylene glycol (MIRALAX / GLYCOLAX) packet Take 17 g by mouth daily as needed for mild constipation.     Marland Kitchen PRAVASTATIN SODIUM PO Take 40 mg by mouth.     . PROLIA 60 MG/ML SOSY injection BRING TO THE OFFICE FOR INJECTION ON 02/15/2018 AS DIRECTED ONCE EVERY 6 MONTHS    . promethazine (PHENERGAN) 25 MG tablet Take 25 mg by mouth every 6 (six) hours as needed for nausea.     . risperiDONE (RISPERDAL) 0.25 MG tablet Take 0.25 mg by mouth daily as needed. As needed for an anxiety attack     . rizatriptan (MAXALT) 10 MG tablet Take 10 mg by mouth as needed.  4  . traZODone (DESYREL) 50 MG tablet Take 1 tablet (50 mg total) by mouth at bedtime. 90 tablet 1  . Ubrogepant (UBRELVY) 50 MG TABS Take 50-100 mg by mouth daily as needed.    Marland Kitchen VASCEPA 1 g CAPS TK 2 CS PO BID WC  3  . VOLTAREN 1 % GEL Apply 2 g topically 4 (four) times daily as needed (JOINT PAIN).   3   No current facility-administered medications for this visit.     Medication Side Effects: None  Allergies:  Allergies  Allergen Reactions  . Sulfa Antibiotics Nausea And Vomiting    Causes pt to vomit blood  . Gluten Meal Other (See Comments)    Sensitivity.   . Lactose Intolerance (Gi) Nausea And Vomiting  . Flagyl [Metronidazole Hcl] Nausea And Vomiting  . Morphine And Related Other (See Comments)    Reaction: Depression, emotional    Past Medical History:  Diagnosis Date  . Anemia   . Anxiety   . Arthritis    osteoarthritis  . ASCUS (atypical squamous cells of undetermined significance) on  Pap smear 05/06/05   NEG HIGH RISK HPV--C&B BIOPSY BENIGN 12/2005  . Asthma   . Bipolar 2 disorder ([place])   . Cancer ([place])    skin cancer - basal cell  . Colon polyps   . Complication of anesthesia    anxious afterwards, will get headaches   . Constipation   . Depression   . Fibromyalgia 10/2013  . GERD (gastroesophageal reflux disease)   . 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 Heart murmur    never had any problems  . Hemorrhoids   . High cholesterol   . 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   cytology negative  . IBS (irritable bowel syndrome)   . Insomnia   . Lymphocytic colitis   . Lymphoma ([place])   . 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 Migraines   . Osteoarthritis   . Osteopenia   . Peripheral neuropathy   . PTSD (post-traumatic stress disorder)     Family History  Problem Relation Age of Onset  . Diabetes Father   . Hypertension Father   . Hyperlipidemia Father   . Heart disease Maternal Grandfather   . Heart disease Paternal Grandmother   . Heart disease Paternal Grandfather   . Depression Son   . Depression Son   . Anxiety disorder Son   . Depression Daughter   . Anxiety disorder Daughter   . Asthma Daughter     Social History   Socioeconomic History  . Marital status: Divorced    Spouse name: Not on file  . Number of children: 3  . Years of education: Not on file  . Highest education level: Not on file  Occupational History  . Not on file  Social Needs  . Financial resource strain: Not on file  . Food insecurity    Worry: Not on file    Inability: Not on file  . Transportation needs    Medical: Not on file    Non-medical: Not on file  Tobacco Use  . Smoking status: Former Smoker    Types: Cigarettes    Quit date: 05/13/2014    Years since quitting: 4.4  . Smokeless tobacco: Never Used  . Tobacco comment: social   Substance and Sexual Activity  . Alcohol use: Yes     Alcohol/week: 0.0 standard  drinks    Comment: rare  . Drug use: Never  . Sexual activity: Not Currently    Comment: -1st intercourse 58 yo-More than 5 partners  Lifestyle  . Physical activity    Days per week: Not on file    Minutes per session: Not on file  . Stress: Not on file  Relationships  . Social Herbalist on phone: Not on file    Gets together: Not on file    Attends religious service: Not on file    Active member of club or organization: Not on file    Attends meetings of clubs or organizations: Not on file    Relationship status: Not on file  . Intimate partner violence    Fear of current or ex partner: Not on file    Emotionally abused: Not on file    Physically abused: Not on file    Forced sexual activity: Not on file  Other Topics Concern  . Not on file  Social History Narrative  . Not on file    Past Medical History, Surgical history, Social history, and Family history were reviewed and updated as appropriate.   Please see review of systems for further details on the patient's review from today.   Objective:   Physical Exam:  LMP 12/28/2010   Physical Exam Constitutional:      General: She is not in acute distress.    Appearance: Normal appearance. She is well-developed.  Musculoskeletal:        General: No deformity.  Neurological:     Mental Status: She is alert and oriented to person, place, and time.     Cranial Nerves: No dysarthria.  Psychiatric:        Attention and Perception: Attention normal. She is attentive. She does not perceive auditory hallucinations.        Mood and Affect: Mood is anxious. Mood is not depressed. Affect is not labile, blunt, angry or inappropriate.        Speech: Speech normal.        Behavior: Behavior normal.        Thought Content: Thought content normal. Thought content does not include homicidal or suicidal ideation. Thought content does not include homicidal or suicidal plan.        Cognition and  Memory: Cognition normal.        Judgment: Judgment normal.     Comments: Insight is good. CO STM issues and recent memory issues.     Lab Review:     Component Value Date/Time   NA 142 08/01/2016 1713   NA 144 07/12/2015 1521   K 4.7 08/01/2016 1713   K 3.8 07/12/2015 1521   CL 107 08/01/2016 1713   CO2 23 08/01/2016 1713   CO2 27 07/12/2015 1521   GLUCOSE 95 08/01/2016 1713   GLUCOSE 82 07/12/2015 1521   BUN 14 08/01/2016 1713   BUN 10.3 07/12/2015 1521   CREATININE 0.87 08/01/2016 1713   CREATININE 0.8 07/12/2015 1521   CALCIUM 10.0 08/01/2016 1713   CALCIUM 9.9 07/12/2015 1521   PROT 7.4 08/01/2016 1713   PROT 7.4 07/12/2015 1521   ALBUMIN 4.8 08/01/2016 1713   ALBUMIN 4.4 07/12/2015 1521   AST 23 08/01/2016 1713   AST 22 07/12/2015 1521   ALT 26 08/01/2016 1713   ALT 31 07/12/2015 1521   ALKPHOS 55 08/01/2016 1713   ALKPHOS 54 07/12/2015 1521   BILITOT 0.7 08/01/2016 1713   BILITOT 0.40  07/12/2015 1521   GFRNONAA 76 08/01/2016 1713   GFRAA 88 08/01/2016 1713       Component Value Date/Time   WBC 5.9 08/01/2016 1713   RBC 4.71 08/01/2016 1713   HGB 14.6 08/01/2016 1713   HGB 14.1 07/12/2015 1521   HCT 43.1 08/01/2016 1713   HCT 41.5 07/12/2015 1521   PLT 254 08/01/2016 1713   PLT 248 07/12/2015 1521   MCV 91.5 08/01/2016 1713   MCV 92.2 07/12/2015 1521   MCH 31.0 08/01/2016 1713   MCHC 33.9 08/01/2016 1713   RDW 13.2 08/01/2016 1713   RDW 13.3 07/12/2015 1521   LYMPHSABS 2,006 08/01/2016 1713   LYMPHSABS 1.8 07/12/2015 1521   MONOABS 354 08/01/2016 1713   MONOABS 0.7 07/12/2015 1521   EOSABS 118 08/01/2016 1713   EOSABS 0.1 07/12/2015 1521   BASOSABS 0 08/01/2016 1713   BASOSABS 0.0 07/12/2015 1521    No results found for: POCLITH, LITHIUM   No results found for: PHENYTOIN, PHENOBARB, VALPROATE, CBMZ   .res Assessment: Plan:    Danielle Bruce was seen today for follow-up, depression, anxiety and other.  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  Need more time to reevaluate this and disc risk mania and mood cycling.  Discussed continued risk of hypomanic symptoms.  She has had symptoms consistent with hypomania in the past but there is now some question as to whether they were PTSD related rather than truly bipolar 2.  Her son has had treatment resistant depression with possible bipolar disorder and her daughter has been diagnosed with bipolar disorder but also a history of borderline personality disorder.  So there is a family history of the spectrum.   Disc prednisone and mood and SE.  Disc lamotrigine and her skin dx.  Mood has been relatively ok off the lamotrigine.  Greater risk will occur in the fall with seasonal depression by history.  Disc desipramine for mood sx and pain and dosing options and SE.  No mood cycling.  Stress management for mood sx.  Disc history of panic and agoraphobia and Covid could strengthen the agoraphobia.  She doesn't go out much and prefers to stay in the house.  Need to work on maintaining independence by pushing outside activity.  We discussed the short-term risks associated with benzodiazepines including sedation and increased fall risk among others.  Discussed long-term side effect risk including dependence, potential withdrawal symptoms, and the potential eventual dose-related risk of dementia.  Keep this at a minimum given cognitive concerns.  She only uses it when triggered with anxiety usually medical.   Retry Disc the off-label use of N-Acetylcysteine at 600 mg daily to help with mild  cognitive problems.  It can be combined with a B-complex vitamin as the B-12 and folate have been shown to sometimes enhance the effect.  Disc use of lower dose bc had HA in the past.  Will need at least 30 days to help.  Discussed potential benefits, risks, and side effects of stimulants with patient to include increased heart rate, palpitations, insomnia, increased anxiety, increased irritability, or decreased appetite.  Instructed patient to contact office if  experiencing any significant tolerability issues. She plans to restart Ritalin 5 BID for cognitive and ADD reasons and disc risk of palpitations.  Greater than 50% 40 mins of face to face time with patient was spent on counseling and coordination of care.   FU 3 mos  Lynder Parents, MD, DFAPA    Please see After Visit Summary for patient specific instructions.  Future Appointments  Date Time Provider [place]  11/12/2018  7:30 AM [facility] MM 2 [facility]  11/14/2018  3:45 PM Bo Merino, MD [facility] None  12/13/2018  2:00 PM Lelon Perla, MD [facility]  03/10/2019  2:00 PM Fontaine, Belinda Block, MD [facility]    No orders of the defined types were placed in this encounter.     -------------------------------

## 2018-11-01 NOTE — Progress Notes (Signed)
Virtual Visit via Video Note  I connected with Danielle Bruce on 11/14/18 at  3:45 PM EDT by a video enabled telemedicine application and verified that I am speaking with the correct person using two identifiers.  Location: Patient: Home  Provider: Clinic  This service was conducted via virtual visit.  Both audio and visual tools were used.  The patient was located at home. I was located in my office.  Consent was obtained prior to the virtual visit and is aware of possible charges through their insurance for this visit.  The patient is an established patient.  Dr. Estanislado Pandy, MD conducted the virtual visit and Hazel Sams, PA-C acted as scribe during the service.  Office staff helped with scheduling follow up visits after the service was conducted.   I discussed the limitations of evaluation and management by telemedicine and the availability of in person appointments. The patient expressed understanding and agreed to proceed.  [id number] pain  History of Present Illness: Patient is a 58 year old female with past medical history of fibromyalgia, osteoarthritis, and DDD. She has chronic ongoing generalized muscle aches and muscle tenderness.  She has intermittent flares. She takes Robaxin as needed during flares.  She takes tylenol arthritis if she is having severe pain. She has persistent fibro fog. She takes trazodone and melatonin at bedtime for insomnia.  She has been experiencing DIP joint pain.  She states at time she notices erythema. She denies any joint swelling.  She has been walking for exercise, but she occasionally experiences bilateral knee joint pain.   Review of Systems  Constitutional: Positive for malaise/fatigue. Negative for fever.  Eyes: Negative for photophobia, pain, discharge and redness.  Respiratory: Negative for cough, shortness of breath and wheezing.   Cardiovascular: Negative for chest pain and palpitations.  Gastrointestinal: Negative for blood in stool,  constipation and diarrhea.  Genitourinary: Negative for dysuria.  Musculoskeletal: Positive for joint pain and myalgias. Negative for back pain and neck pain.       +Morning stiffness   Skin: Negative for rash.  Neurological: Negative for dizziness and headaches.  Psychiatric/Behavioral: Negative for depression. The patient has insomnia (Trazodone). The patient is not nervous/anxious.       Observations/Objective: Physical Exam  Constitutional: She is oriented to person, place, and time and well-developed, well-nourished, and in no distress.  HENT:  Head: Normocephalic and atraumatic.  Eyes: Conjunctivae are normal.  Pulmonary/Chest: Effort normal.  Neurological: She is alert and oriented to person, place, and time.  Psychiatric: Mood, memory, affect and judgment normal.   Patient reports morning stiffness for 1-2 hours.   Patient reports nocturnal pain.  Difficulty dressing/grooming: Denies Difficulty climbing stairs: Reports Difficulty getting out of chair: Denies Difficulty using hands for taps, buttons, cutlery, and/or writing: Denies   Assessment and Plan: Visit Diagnoses: Fibromyalgia: She has generalized muscle aches and muscle tenderness due to fibromyalgia.  She has intermittent flares, and she takes robaxin as needed for muscle spasms.  She continues to have persistent fibro fog.  We discussed trying to log reminders for herself to help with the forgetfulness.  We discussed the importance of good sleep hygiene.  She takes trazodone and melatonin 2 mg at bedtime for insomnia.  She occasionally will sleep 10-12 hours/night if she does not have a reason to wake up in the morning. She has tried to cut back on trazodone but has difficulty sleeping through the night. She continues to have chronic fatigue.  We discussed the importance of  regular exercise. We recommended 1 hour of exercise daily (20 minutes 3 times daily).  She has been trying to walk for exercise on a regular basis.   She will follow up in 1 year.  Other fatigue: Chronic fatigue related to insomnia. We discussed the importance of regular exercise and good sleep hygiene.   Other insomnia: She takes trazodone 50 mg 1 tablet by mouth at bedtime and melatonin at bedtime for insomnia. Good sleep hygiene was discussed.   Primary osteoarthritis of both hands: She has DIP synovial thickening.  She has been experiencing intermittent DIP joint pain.  Hand exercises were discussed.  Joint protection and muscle strengthening were discussed. She can apply voltaren gel topically as needed for pain relief.    Primary osteoarthritis of both feet: She has no feet pain at this time.   DDD (degenerative disc disease), cervical: She has no neck pain at this time. She occasionally experiences nocturnal pain.   Trochanteric bursitis of both hips: She has intermittent trochanteric bursitis.   Chronic SI joint pain: She has no discomfort at this time.   Chronic pain of both ankles: She has no discomfort at this time.   Other medical conditions are listed as follows:  Osteopenia of multiple sites  Chronic cholecystitis without calculus  Lymphocytic colitis  History of posttraumatic stress disorder (PTSD)  Bipolar 2 disorder ([facility])  History of IBS  MGUS (monoclonal gammopathy of unknown significance)    Follow Up Instructions: She will follow up in 1 year.    I discussed the assessment and treatment plan with the patient. The patient was provided an opportunity to ask questions and all were answered. The patient agreed with the plan and demonstrated an understanding of the instructions.   The patient was advised to call back or seek an in-person evaluation if the symptoms worsen or if the condition fails to improve as anticipated.  I provided 25 minutes of non-face-to-face time during this encounter.   Bo Merino, MD   Scribed by- Hazel Sams PA-C

## 2018-11-07 DIAGNOSIS — F3181 Bipolar II disorder: Secondary | ICD-10-CM | POA: Diagnosis not present

## 2018-11-12 ENCOUNTER — Other Ambulatory Visit: Payer: Self-pay

## 2018-11-12 ENCOUNTER — Ambulatory Visit
Admission: RE | Admit: 2018-11-12 | Discharge: 2018-11-12 | Disposition: A | Discharge status: Home / Self Care | Payer: Medicare Other | Source: Ambulatory Visit | Attending: Gynecology | Admitting: Gynecology

## 2018-11-12 DIAGNOSIS — Z1231 Encounter for screening mammogram for malignant neoplasm of breast: Secondary | ICD-10-CM | POA: Diagnosis not present

## 2018-11-14 ENCOUNTER — Other Ambulatory Visit: Payer: Self-pay

## 2018-11-14 ENCOUNTER — Telehealth (INDEPENDENT_AMBULATORY_CARE_PROVIDER_SITE_OTHER): Payer: Medicare Other | Admitting: Rheumatology

## 2018-11-14 ENCOUNTER — Encounter: Payer: Self-pay | Admitting: Rheumatology

## 2018-11-14 DIAGNOSIS — M19072 Primary osteoarthritis, left ankle and foot: Secondary | ICD-10-CM

## 2018-11-14 DIAGNOSIS — M19041 Primary osteoarthritis, right hand: Secondary | ICD-10-CM

## 2018-11-14 DIAGNOSIS — F3181 Bipolar II disorder: Secondary | ICD-10-CM

## 2018-11-14 DIAGNOSIS — M8589 Other specified disorders of bone density and structure, multiple sites: Secondary | ICD-10-CM

## 2018-11-14 DIAGNOSIS — M7061 Trochanteric bursitis, right hip: Secondary | ICD-10-CM | POA: Diagnosis not present

## 2018-11-14 DIAGNOSIS — Z8659 Personal history of other mental and behavioral disorders: Secondary | ICD-10-CM

## 2018-11-14 DIAGNOSIS — K52832 Lymphocytic colitis: Secondary | ICD-10-CM | POA: Diagnosis not present

## 2018-11-14 DIAGNOSIS — Z8719 Personal history of other diseases of the digestive system: Secondary | ICD-10-CM

## 2018-11-14 DIAGNOSIS — M5030 Other cervical disc degeneration, unspecified cervical region: Secondary | ICD-10-CM

## 2018-11-14 DIAGNOSIS — M19042 Primary osteoarthritis, left hand: Secondary | ICD-10-CM

## 2018-11-14 DIAGNOSIS — R5383 Other fatigue: Secondary | ICD-10-CM

## 2018-11-14 DIAGNOSIS — M7062 Trochanteric bursitis, left hip: Secondary | ICD-10-CM

## 2018-11-14 DIAGNOSIS — M19071 Primary osteoarthritis, right ankle and foot: Secondary | ICD-10-CM | POA: Diagnosis not present

## 2018-11-14 DIAGNOSIS — D472 Monoclonal gammopathy: Secondary | ICD-10-CM

## 2018-11-14 DIAGNOSIS — M797 Fibromyalgia: Secondary | ICD-10-CM | POA: Diagnosis not present

## 2018-11-14 DIAGNOSIS — G4709 Other insomnia: Secondary | ICD-10-CM | POA: Diagnosis not present

## 2018-11-14 DIAGNOSIS — K811 Chronic cholecystitis: Secondary | ICD-10-CM

## 2018-11-18 ENCOUNTER — Encounter: Payer: Self-pay | Admitting: Psychiatry

## 2018-11-20 ENCOUNTER — Encounter: Payer: Self-pay | Admitting: Gynecology

## 2018-11-20 DIAGNOSIS — M797 Fibromyalgia: Secondary | ICD-10-CM | POA: Diagnosis not present

## 2018-11-20 DIAGNOSIS — F3181 Bipolar II disorder: Secondary | ICD-10-CM | POA: Diagnosis not present

## 2018-11-20 DIAGNOSIS — F900 Attention-deficit hyperactivity disorder, predominantly inattentive type: Secondary | ICD-10-CM | POA: Diagnosis not present

## 2018-11-20 DIAGNOSIS — K589 Irritable bowel syndrome without diarrhea: Secondary | ICD-10-CM | POA: Diagnosis not present

## 2018-11-20 DIAGNOSIS — R000 Tachycardia, unspecified: Secondary | ICD-10-CM | POA: Diagnosis not present

## 2018-11-20 DIAGNOSIS — J309 Allergic rhinitis, unspecified: Secondary | ICD-10-CM | POA: Diagnosis not present

## 2018-11-20 DIAGNOSIS — Z7189 Other specified counseling: Secondary | ICD-10-CM | POA: Diagnosis not present

## 2018-11-20 DIAGNOSIS — G43909 Migraine, unspecified, not intractable, without status migrainosus: Secondary | ICD-10-CM | POA: Diagnosis not present

## 2018-11-21 ENCOUNTER — Telehealth: Payer: Self-pay | Admitting: Rheumatology

## 2018-11-21 DIAGNOSIS — F3181 Bipolar II disorder: Secondary | ICD-10-CM | POA: Diagnosis not present

## 2018-11-21 NOTE — Telephone Encounter (Signed)
I left message for patient to call, and schedule her 1 year follow up appointmentt with Hazel Sams, PAC, around 11/14/2019.

## 2018-11-22 ENCOUNTER — Other Ambulatory Visit: Payer: Self-pay | Admitting: Psychiatry

## 2018-12-03 NOTE — Progress Notes (Signed)
Virtual Visit via Video Note   This visit type was conducted due to national recommendations for restrictions regarding the COVID-19 Pandemic (e.g. social distancing) in an effort to limit this patient's exposure and mitigate transmission in our community.  Due to her co-morbid illnesses, this patient is at least at moderate risk for complications without adequate follow up.  This format is felt to be most appropriate for this patient at this time.  All issues noted in this document were discussed and addressed.  A limited physical exam was performed with this format.  Please refer to the patient's chart for her consent to telehealth for [facility].   Date:  12/13/2018   ID:  Danielle Bruce, DOB June 09, 1960, MRN [id number]  Patient Location:Home Provider Location: Home  PCP:  Mayra Neer, MD  Cardiologist:  Dr Stanford Breed  Evaluation Performed:  Follow-Up Visit  Chief Complaint:  FU palpitations  History of Present Illness:    FU palpitations.  Holter monitor July 2020 showed sinus bradycardia, normal sinus rhythm, sinus tachycardia and rare PVC.  Echocardiogram August 2020 showed normal LV function and mild diastolic dysfunction.    Nuclear study August 2020 showed ejection fraction 70% with normal perfusion.  Since last seen she denies dyspnea, chest pain and her palpitations have improved.  There is no syncope.  The patient does not have symptoms concerning for COVID-19 infection (fever, chills, cough, or new shortness of breath).    Past Medical History:  Diagnosis Date  . Anemia   . Anxiety   . Arthritis    osteoarthritis  . ASCUS (atypical squamous cells of undetermined significance) on Pap smear 05/06/05   NEG HIGH RISK HPV--C&B BIOPSY BENIGN 12/2005  . Asthma   . Bipolar 2 disorder ([place])   . Cancer ([place])    skin cancer - basal cell  . Colon polyps   . Complication of anesthesia    anxious afterwards, will get headaches   . Constipation   . Depression   .  Fibromyalgia 10/2013  . GERD (gastroesophageal reflux disease)   . 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 Heart murmur    never had any problems  . Hemorrhoids   . High cholesterol   . 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   cytology negative  . IBS (irritable bowel syndrome)   . Insomnia   . Lymphocytic colitis   . Lymphoma ([place])   . 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 Migraines   . Osteoarthritis   . Osteopenia   . Peripheral neuropathy   . PTSD (post-traumatic stress disorder)    Past Surgical History:  Procedure Laterality Date  . BONE MARROW BIOPSY Left 12/18/2013   Plasma cell dyscrasia 8% population of plasma cells  . BREAST BIOPSY Right    benign stereo  . CESAREAN SECTION  [phone]  . CHOLECYSTECTOMY N/A 10/16/2012   Procedure: LAPAROSCOPIC CHOLECYSTECTOMY;  Surgeon: Joyice Faster. Cornett, MD;  Location: [facility];  Service: General;  Laterality: N/A;  . COLONOSCOPY     numerous times  . DILATION AND CURETTAGE OF UTERUS    . ESOPHAGOGASTRODUODENOSCOPY    . [place]   x3  . IUD REMOVAL  02/2015   Mirena  . LAPAROSCOPIC LYSIS OF ADHESIONS N/A 10/16/2012   Procedure: LAPAROSCOPIC LYSIS OF ADHESIONS;  Surgeon: Joyice Faster. Cornett, MD;  Location: [facility];  Service: General;  Laterality: N/A;  .  LAPAROSCOPY N/A 10/16/2012   Procedure: LAPAROSCOPY DIAGNOSTIC;  Surgeon: Joyice Faster. Cornett, MD;  Location: [facility];  Service: General;  Laterality: N/A;  . PELVIC LAPAROSCOPY    . RADIOLOGY WITH ANESTHESIA N/A 12/16/2015   Procedure: MRI OF BRAIN WITH AND WITHOUT CONTRAST;  Surgeon: Medication Radiologist, MD;  Location: [place];  Service: Radiology;  Laterality: N/A;  . SHOULDER SURGERY  2007/2008  . SPINE SURGERY  2010   cervical     Current Meds  Medication Sig  . albuterol (ACCUNEB) 0.63 MG/3ML nebulizer solution Take 1 ampule by nebulization every 6 (six) hours as needed for wheezing.  Marland Kitchen albuterol  (PROVENTIL HFA;VENTOLIN HFA) 108 (90 Base) MCG/ACT inhaler Inhale 2 puffs into the lungs every 6 (six) hours as needed for wheezing or shortness of breath.  . Ascorbic Acid (VITAMIN C) 100 MG tablet Take 100 mg by mouth daily.  Marland Kitchen azelastine (ASTELIN) 0.1 % nasal spray Place into both nostrils 2 (two) times daily. Use in each nostril as directed  . BEPREVE 1.5 % SOLN Place 1 drop into both eyes daily.   . diphenhydrAMINE (BENADRYL) 25 mg capsule Take 50 mg by mouth every 6 (six) hours as needed (HEADACHES).   Marland Kitchen EPINEPHrine 0.3 mg/0.3 mL IJ SOAJ injection as needed.  Eduard Roux (AIMOVIG) 70 MG/ML SOAJ Inject into the skin.  . fluticasone (FLONASE) 50 MCG/ACT nasal spray Place 2 sprays into both nostrils daily.  . hydrOXYzine (ATARAX/VISTARIL) 25 MG tablet TK 1 TO 2 TS PO TID PRF MIGRAINE  . L-methylfolate Calcium 15 MG TABS TAKE 1 TABLET BY MOUTH DAILY  . levocetirizine (XYZAL) 5 MG tablet Take 5 mg by mouth every evening.  Marland Kitchen LORazepam (ATIVAN) 1 MG tablet Take 1 mg by mouth every 8 (eight) hours as needed for anxiety. (takes 2 mg when having a medical procedure.)  . LORazepam (ATIVAN) 2 MG/ML concentrated solution Take 0.5 mLs (1 mg total) by mouth every 8 (eight) hours as needed for anxiety (severe panic).  . MELATONIN PO Take 1.5 mg by mouth at bedtime.  . montelukast (SINGULAIR) 10 MG tablet Take 10 mg by mouth at bedtime.  Marland Kitchen oxyCODONE-acetaminophen (PERCOCET/ROXICET) 5-325 MG per tablet Take 2 tablets by mouth daily as needed for moderate pain or severe pain (mighraine.).   Marland Kitchen polyethylene glycol (MIRALAX / GLYCOLAX) packet Take 17 g by mouth daily as needed for mild constipation.   Marland Kitchen PRAVASTATIN SODIUM PO Take 40 mg by mouth.   . PROLIA 60 MG/ML SOSY injection BRING TO THE OFFICE FOR INJECTION ON 02/15/2018 AS DIRECTED ONCE EVERY 6 MONTHS  . promethazine (PHENERGAN) 25 MG tablet Take 25 mg by mouth every 6 (six) hours as needed for nausea.   . risperiDONE (RISPERDAL) 0.25 MG tablet Take  0.25 mg by mouth daily as needed. As needed for an anxiety attack   . rizatriptan (MAXALT) 10 MG tablet Take 10 mg by mouth as needed.   . traZODone (DESYREL) 50 MG tablet TAKE 1 TABLET(50 MG) BY MOUTH AT BEDTIME  . Ubrogepant (UBRELVY) 50 MG TABS Take 50-100 mg by mouth daily as needed.  Marland Kitchen VASCEPA 1 g CAPS TK 2 CS PO BID WC  . VITAMIN D PO Take by mouth. Take 5065m daily  . VOLTAREN 1 % GEL Apply 2 g topically 4 (four) times daily as needed (JOINT PAIN).      Allergies:   Sulfa antibiotics, Gluten meal, Lactose intolerance (gi), Flagyl [metronidazole hcl], and Morphine and related   Social History  Tobacco Use  . Smoking status: Former Smoker    Types: Cigarettes    Quit date: 05/13/2014    Years since quitting: 4.5  . Smokeless tobacco: Never Used  . Tobacco comment: social   Substance Use Topics  . Alcohol use: Yes    Alcohol/week: 0.0 standard drinks    Comment: rare  . Drug use: Never     Family Hx: The patient's family history includes Anxiety disorder in her daughter and son; Asthma in her daughter; Depression in her daughter, son, and son; Diabetes in her father; Heart disease in her maternal grandfather, paternal grandfather, and paternal grandmother; Hyperlipidemia in her father; Hypertension in her father.  ROS:   Please see the history of present illness.    No Fever, chills  or productive cough All other systems reviewed and are negative.  Wt Readings from Last 3 Encounters:  12/13/18 123 lb (55.8 kg)  10/23/18 126 lb (57.2 kg)  10/11/18 126 lb 9.6 oz (57.4 kg)     Objective:    Vital Signs:  BP 96/61   Pulse 68   Ht 5' 2"  (1.575 m)   Wt 123 lb (55.8 kg)   LMP 12/28/2010   BMI 22.50 kg/m    VITAL SIGNS:  reviewed NAD Answers questions appropriately Normal affect Remainder of physical examination not performed (telehealth visit; coronavirus pandemic)  ASSESSMENT & PLAN:    1. Palpitations-palpitations have improved.  Previous Holter monitor  showed sinus bradycardia, normal sinus rhythm, sinus tachycardia and rare PVC.  Echocardiogram showed preserved LV function.  We can consider a beta-blocker in the future if she has recurrent symptoms that are particularly symptomatic. 2. Chest pain-previous symptoms atypical and have now improved.  Recent nuclear study negative.  No plans for further ischemia evaluation. 3. Hyperlipidemia-continue statin.  COVID-19 Education: The importance of social distancing was discussed today.  Time:   Today, I have spent 18 minutes with the patient with telehealth technology discussing the above problems.     Medication Adjustments/Labs and Tests Ordered: Current medicines are reviewed at length with the patient today.  Concerns regarding medicines are outlined above.   Tests Ordered: No orders of the defined types were placed in this encounter.   Medication Changes: No orders of the defined types were placed in this encounter.   Follow Up:  Either In Person or Virtual Visit prn  Signed, Kirk Ruths, MD  12/13/2018 1:55 PM    [place]

## 2018-12-05 DIAGNOSIS — F3181 Bipolar II disorder: Secondary | ICD-10-CM | POA: Diagnosis not present

## 2018-12-12 DIAGNOSIS — F3181 Bipolar II disorder: Secondary | ICD-10-CM | POA: Diagnosis not present

## 2018-12-13 ENCOUNTER — Telehealth (INDEPENDENT_AMBULATORY_CARE_PROVIDER_SITE_OTHER): Payer: Medicare Other | Admitting: Cardiology

## 2018-12-13 ENCOUNTER — Encounter: Payer: Self-pay | Admitting: Cardiology

## 2018-12-13 VITALS — BP 96/61 | HR 68 | Ht 62.0 in | Wt 123.0 lb

## 2018-12-13 DIAGNOSIS — E7800 Pure hypercholesterolemia, unspecified: Secondary | ICD-10-CM | POA: Diagnosis not present

## 2018-12-13 DIAGNOSIS — R072 Precordial pain: Secondary | ICD-10-CM

## 2018-12-13 DIAGNOSIS — R002 Palpitations: Secondary | ICD-10-CM

## 2018-12-13 NOTE — Patient Instructions (Signed)
Medication Instructions:  NO CHANGE *If you need a refill on your cardiac medications before your next appointment, please call your pharmacy*  Lab Work: If you have labs (blood work) drawn today and your tests are completely normal, you will receive your results only by: Marland Kitchen MyChart Message (if you have MyChart) OR . A paper copy in the mail If you have any lab test that is abnormal or we need to change your treatment, we will call you to review the results.  Follow-Up: At [facility], you and your health needs are our priority.  As part of our continuing mission to provide you with exceptional heart care, we have created designated Provider Care Teams.  These Care Teams include your primary Cardiologist (physician) and Advanced Practice Providers (APPs -  Physician Assistants and Nurse Practitioners) who all work together to provide you with the care you need, when you need it.  Your physician recommends that you schedule a follow-up appointment in: AS NEEDED

## 2018-12-19 DIAGNOSIS — F3181 Bipolar II disorder: Secondary | ICD-10-CM | POA: Diagnosis not present

## 2018-12-24 DIAGNOSIS — R5383 Other fatigue: Secondary | ICD-10-CM | POA: Diagnosis not present

## 2018-12-24 DIAGNOSIS — Z7189 Other specified counseling: Secondary | ICD-10-CM | POA: Diagnosis not present

## 2018-12-24 DIAGNOSIS — M797 Fibromyalgia: Secondary | ICD-10-CM | POA: Diagnosis not present

## 2018-12-26 DIAGNOSIS — F3181 Bipolar II disorder: Secondary | ICD-10-CM | POA: Diagnosis not present

## 2019-01-01 ENCOUNTER — Telehealth: Payer: Medicare Other | Admitting: Cardiology

## 2019-01-09 DIAGNOSIS — F3181 Bipolar II disorder: Secondary | ICD-10-CM | POA: Diagnosis not present

## 2019-01-28 DIAGNOSIS — K649 Unspecified hemorrhoids: Secondary | ICD-10-CM | POA: Diagnosis not present

## 2019-01-28 DIAGNOSIS — F4310 Post-traumatic stress disorder, unspecified: Secondary | ICD-10-CM | POA: Diagnosis not present

## 2019-01-28 DIAGNOSIS — F900 Attention-deficit hyperactivity disorder, predominantly inattentive type: Secondary | ICD-10-CM | POA: Diagnosis not present

## 2019-01-28 DIAGNOSIS — K219 Gastro-esophageal reflux disease without esophagitis: Secondary | ICD-10-CM | POA: Diagnosis not present

## 2019-01-28 DIAGNOSIS — F411 Generalized anxiety disorder: Secondary | ICD-10-CM | POA: Diagnosis not present

## 2019-01-28 DIAGNOSIS — K5289 Other specified noninfective gastroenteritis and colitis: Secondary | ICD-10-CM | POA: Diagnosis not present

## 2019-01-28 DIAGNOSIS — E7800 Pure hypercholesterolemia, unspecified: Secondary | ICD-10-CM | POA: Diagnosis not present

## 2019-01-28 DIAGNOSIS — F3181 Bipolar II disorder: Secondary | ICD-10-CM | POA: Diagnosis not present

## 2019-01-28 DIAGNOSIS — Z0000 Encounter for general adult medical examination without abnormal findings: Secondary | ICD-10-CM | POA: Diagnosis not present

## 2019-01-28 DIAGNOSIS — D472 Monoclonal gammopathy: Secondary | ICD-10-CM | POA: Diagnosis not present

## 2019-01-28 DIAGNOSIS — J45909 Unspecified asthma, uncomplicated: Secondary | ICD-10-CM | POA: Diagnosis not present

## 2019-01-28 DIAGNOSIS — G629 Polyneuropathy, unspecified: Secondary | ICD-10-CM | POA: Diagnosis not present

## 2019-01-30 DIAGNOSIS — F3181 Bipolar II disorder: Secondary | ICD-10-CM | POA: Diagnosis not present

## 2019-03-04 DIAGNOSIS — M797 Fibromyalgia: Secondary | ICD-10-CM | POA: Diagnosis not present

## 2019-03-04 DIAGNOSIS — H00013 Hordeolum externum right eye, unspecified eyelid: Secondary | ICD-10-CM | POA: Diagnosis not present

## 2019-03-04 DIAGNOSIS — K589 Irritable bowel syndrome without diarrhea: Secondary | ICD-10-CM | POA: Diagnosis not present

## 2019-03-04 DIAGNOSIS — F900 Attention-deficit hyperactivity disorder, predominantly inattentive type: Secondary | ICD-10-CM | POA: Diagnosis not present

## 2019-03-04 DIAGNOSIS — D472 Monoclonal gammopathy: Secondary | ICD-10-CM | POA: Diagnosis not present

## 2019-03-04 DIAGNOSIS — F3181 Bipolar II disorder: Secondary | ICD-10-CM | POA: Diagnosis not present

## 2019-03-04 DIAGNOSIS — G43909 Migraine, unspecified, not intractable, without status migrainosus: Secondary | ICD-10-CM | POA: Diagnosis not present

## 2019-03-06 DIAGNOSIS — F3181 Bipolar II disorder: Secondary | ICD-10-CM | POA: Diagnosis not present

## 2019-03-10 ENCOUNTER — Encounter: Payer: Self-pay | Admitting: Gynecology

## 2019-03-18 DIAGNOSIS — D472 Monoclonal gammopathy: Secondary | ICD-10-CM | POA: Diagnosis not present

## 2019-03-18 DIAGNOSIS — R7301 Impaired fasting glucose: Secondary | ICD-10-CM | POA: Diagnosis not present

## 2019-03-18 DIAGNOSIS — R7309 Other abnormal glucose: Secondary | ICD-10-CM | POA: Diagnosis not present

## 2019-03-18 DIAGNOSIS — E7800 Pure hypercholesterolemia, unspecified: Secondary | ICD-10-CM | POA: Diagnosis not present

## 2019-03-18 DIAGNOSIS — Z23 Encounter for immunization: Secondary | ICD-10-CM | POA: Diagnosis not present

## 2019-03-20 DIAGNOSIS — F3181 Bipolar II disorder: Secondary | ICD-10-CM | POA: Diagnosis not present

## 2019-03-26 ENCOUNTER — Other Ambulatory Visit: Payer: Self-pay | Admitting: Psychiatry

## 2019-03-27 DIAGNOSIS — F3181 Bipolar II disorder: Secondary | ICD-10-CM | POA: Diagnosis not present

## 2019-03-31 DIAGNOSIS — M810 Age-related osteoporosis without current pathological fracture: Secondary | ICD-10-CM | POA: Diagnosis not present

## 2019-04-03 DIAGNOSIS — F3181 Bipolar II disorder: Secondary | ICD-10-CM | POA: Diagnosis not present

## 2019-04-07 ENCOUNTER — Ambulatory Visit (INDEPENDENT_AMBULATORY_CARE_PROVIDER_SITE_OTHER): Payer: Medicare Other | Admitting: Psychiatry

## 2019-04-07 ENCOUNTER — Encounter: Payer: Self-pay | Admitting: Psychiatry

## 2019-04-07 DIAGNOSIS — F902 Attention-deficit hyperactivity disorder, combined type: Secondary | ICD-10-CM

## 2019-04-07 DIAGNOSIS — F422 Mixed obsessional thoughts and acts: Secondary | ICD-10-CM

## 2019-04-07 DIAGNOSIS — F411 Generalized anxiety disorder: Secondary | ICD-10-CM

## 2019-04-07 DIAGNOSIS — F3181 Bipolar II disorder: Secondary | ICD-10-CM | POA: Diagnosis not present

## 2019-04-07 DIAGNOSIS — F4310 Post-traumatic stress disorder, unspecified: Secondary | ICD-10-CM

## 2019-04-07 DIAGNOSIS — F4001 Agoraphobia with panic disorder: Secondary | ICD-10-CM | POA: Diagnosis not present

## 2019-04-07 DIAGNOSIS — F40241 Acrophobia: Secondary | ICD-10-CM | POA: Diagnosis not present

## 2019-04-07 MED ORDER — LORAZEPAM 1 MG PO TABS: 1.0000 mg | ORAL_TABLET | Freq: Three times a day (TID) | ORAL | 1 refills | Status: DC | PRN

## 2019-04-07 MED ORDER — METHYLPHENIDATE HCL 5 MG PO TABS: 5.0000 mg | ORAL_TABLET | Freq: Two times a day (BID) | ORAL | 0 refills | Status: DC

## 2019-04-07 NOTE — Progress Notes (Signed)
[name] [id number] 11-02-1960 59 y.o.  Virtual Visit via Webex  I connected with pt on 04/07/19 at  4:00 PM EST by Webex and verified that I am speaking with the correct person using two identifiers.   I discussed the limitations, risks, security and privacy concerns of performing an evaluation and management service by Webex and the availability of in person appointments. I also discussed with the patient that there may be a patient responsible charge related to this service. The patient expressed understanding and agreed to proceed.   I discussed the assessment and treatment plan with the patient. The patient was provided an opportunity to ask questions and all were answered. The patient agreed with the plan and demonstrated an understanding of the instructions.   The patient was advised to call back or seek an in-person evaluation if the symptoms worsen or if the condition fails to improve as anticipated.  I provided 40 minutes of non-face-to-face time during this encounter.  The patient was located at home.  The provider was located at [place].   Purnell Shoemaker, MD   Subjective:   Patient ID:  Danielle Bruce is a 59 y.o. (DOB March 11, 1960) female.  Chief Complaint:  Chief Complaint  Patient presents with  . Follow-up    Medication Management  . Other    Bipolar 2  . Anxiety  . Medication Problem    HPI Danielle Bruce presents for follow-up of multiple dxes.  visit in April and taken off lamotrigine DT possible skin reaction.  Had appt with [facility] dermatology and they assume that skin reaction with itching was related to lamotrigine. 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 Per oncology most likely drug is lamotrigine. And she weaned off of the medication to determine if the cutaneous lesions are related. Lesions seem less  without lamotrigine.  At this point skin reaction is presumed related to lamotrigine.  No other disease sx. She is well aware that she has been on lamotrigine for many years with good results for depression control. There is a risk of relapse as she comes off of the medication and she is worried about that. Smart phone helps her cognitive  And other productivity.    Last seen October 2020.  Restarted low dosage ADD bc concerns over STM, concentration and productivity.  Ritalin 5 mg twice daily.  Also suggested she retry N-acetylcysteine for mild cognitive complaints.  Mostly good.  Anxiety is mostly better, but depression is a little worse seasonally.  Not able to go help her daughter.   Low energy but does have motivation.  Ritalin helps energy and focus but doesn't take it regularly bc irregular sleep cycle.    Ativan only when having medical issues.    Therapy working on trauma issues is very hard.  Struggling with the fact she doesn't have memory of large parts of her past.    No manic sx off mood stabilizers.  Covid isolation reduce stressors overall and not prominently depressed.    Patient reports stable mood and denies depressed or irritable moods.  Patient reports panic recently and recent difficulty with anxiety.    Occ flashbacks.   Patient denies difficulty with sleep initiation or maintenance. Denies appetite disturbance.  Patient reports that energy and motivation have been good.  Patient denies any difficulty with concentration.  Patient denies any suicidal ideation.   Past  psych meds: Lithium, carbamazepine, oxcarbazepine, topiramate, valproic acid, lamotrigine, gabapentin  Zyprexa,Seroquel, Latuda headaches, Geodon,  risperidone Stelazine,  Rexulti,  Wellbutrin with side effects,  sertraline irritability, duloxetine, fluvoxamine, fluoxetine, Stopped desipramine DT skin issues which now resolved.  It helped GI px.   buspirone side effects,   N-acetylcysteine, pramipexole,   methylphenidate,  Adderall,   Ambien with amnesia, trazodone, Ativan  Review of Systems:  Review of Systems  Gastrointestinal: Positive for abdominal distention and abdominal pain. Negative for vomiting.  Neurological: Positive for dizziness and headaches. Negative for tremors and weakness.  Psychiatric/Behavioral: Negative for agitation, behavioral problems, confusion, dysphoric mood, hallucinations, self-injury and suicidal ideas. The patient is nervous/anxious.   Less HA with new meds.  Medications: I have reviewed the patient's current medications.  Current Outpatient Medications  Medication Sig Dispense Refill  . albuterol (ACCUNEB) 0.63 MG/3ML nebulizer solution Take 1 ampule by nebulization every 6 (six) hours as needed for wheezing.    Marland Kitchen albuterol (PROVENTIL HFA;VENTOLIN HFA) 108 (90 Base) MCG/ACT inhaler Inhale 2 puffs into the lungs every 6 (six) hours as needed for wheezing or shortness of breath.    . Ascorbic Acid (VITAMIN C) 100 MG tablet Take 100 mg by mouth daily.    Marland Kitchen azelastine (ASTELIN) 0.1 % nasal spray Place into both nostrils 2 (two) times daily. Use in each nostril as directed    . BEPREVE 1.5 % SOLN Place 1 drop into both eyes daily.     . diphenhydrAMINE (BENADRYL) 25 mg capsule Take 50 mg by mouth every 6 (six) hours as needed (HEADACHES).     Marland Kitchen EPINEPHrine 0.3 mg/0.3 mL IJ SOAJ injection as needed.    Eduard Roux (AIMOVIG) 70 MG/ML SOAJ Inject into the skin.    . fluticasone (FLONASE) 50 MCG/ACT nasal spray Place 2 sprays into both nostrils daily. 16 g 0  . hydrOXYzine (ATARAX/VISTARIL) 25 MG tablet TK 1 TO 2 TS PO TID PRF MIGRAINE  4  . L-methylfolate Calcium 15 MG TABS TAKE 1 TABLET BY MOUTH DAILY 30 tablet 5  . levocetirizine (XYZAL) 5 MG tablet Take 5 mg by mouth every evening.  5  . LORazepam (ATIVAN) 2 MG/ML concentrated solution Take 0.5 mLs (1 mg total) by mouth every 8 (eight) hours as needed for anxiety (severe panic). 30 mL 1  . MELATONIN PO Take  1.5 mg by mouth at bedtime.    . montelukast (SINGULAIR) 10 MG tablet Take 10 mg by mouth at bedtime.    Marland Kitchen oxyCODONE-acetaminophen (PERCOCET/ROXICET) 5-325 MG per tablet Take 2 tablets by mouth daily as needed for moderate pain or severe pain (mighraine.).     Marland Kitchen polyethylene glycol (MIRALAX / GLYCOLAX) packet Take 17 g by mouth daily as needed for mild constipation.     Marland Kitchen PRAVASTATIN SODIUM PO Take 40 mg by mouth.     . predniSONE (DELTASONE) 5 MG tablet Take 2.5-5 mg by mouth daily as needed.    Marland Kitchen PROLIA 60 MG/ML SOSY injection BRING TO THE OFFICE FOR INJECTION ON 02/15/2018 AS DIRECTED ONCE EVERY 6 MONTHS    . promethazine (PHENERGAN) 25 MG tablet Take 25 mg by mouth every 6 (six) hours as needed for nausea.     . risperiDONE (RISPERDAL) 0.25 MG tablet Take 0.25 mg by mouth daily as needed. As needed for an anxiety attack     . rizatriptan (MAXALT) 10 MG tablet Take 10 mg by mouth as needed.   4  . traZODone (DESYREL) 50 MG tablet TAKE  1 TABLET(50 MG) BY MOUTH AT BEDTIME 90 tablet 1  . Ubrogepant (UBRELVY) 50 MG TABS Take 50-100 mg by mouth daily as needed.    Marland Kitchen VASCEPA 1 g CAPS TK 2 CS PO BID WC  3  . VITAMIN D PO Take by mouth. Take 5064m daily    . VOLTAREN 1 % GEL Apply 2 g topically 4 (four) times daily as needed (JOINT PAIN).   3  . LORazepam (ATIVAN) 1 MG tablet Take 1 tablet (1 mg total) by mouth every 8 (eight) hours as needed for anxiety. (takes 2 mg when having a medical procedure.) 30 tablet 1  . methylphenidate (RITALIN) 5 MG tablet Take 1 tablet (5 mg total) by mouth 2 (two) times daily with breakfast and lunch. 60 tablet 0   No current facility-administered medications for this visit.    Medication Side Effects: None  Allergies:  Allergies  Allergen Reactions  . Sulfa Antibiotics Nausea And Vomiting    Causes pt to vomit blood  . Gluten Meal Other (See Comments)    Sensitivity.   . Lactose Intolerance (Gi) Nausea And Vomiting  . Flagyl [Metronidazole Hcl] Nausea And  Vomiting  . Morphine And Related Other (See Comments)    Reaction: Depression, emotional    Past Medical History:  Diagnosis Date  . Anemia   . Anxiety   . Arthritis    osteoarthritis  . ASCUS (atypical squamous cells of undetermined significance) on Pap smear 05/06/05   NEG HIGH RISK HPV--C&B BIOPSY BENIGN 12/2005  . Asthma   . [place] 2 [place] ([place]   . [place]    skin cancer - basal cell  . Colon polyps   . Complication of anesthesia    anxious afterwards, will get headaches   . Constipation   . Depression   . Fibromyalgia 10/2013  . GERD (gastroesophageal reflux disease)   . 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 Heart murmur    never had any problems  . Hemorrhoids   . High cholesterol   . 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   cytology negative  . IBS (irritable bowel syndrome)   . Insomnia   . Lymphocytic colitis   . Lymphoma ([facility]   . 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 Migraines   . Osteoarthritis   . Osteopenia   . Peripheral neuropathy   . PTSD (post-traumatic stress disorder)     Family History  Problem Relation Age of Onset  . Diabetes Father   . Hypertension Father   . Hyperlipidemia Father   . Heart disease Maternal Grandfather   . Heart disease Paternal Grandmother   . Heart disease Paternal Grandfather   . Depression Son   . Depression Son   . Anxiety disorder Son   . Depression Daughter   . Anxiety disorder Daughter   . Asthma Daughter     Social History   Socioeconomic History  . Marital status: Divorced    Spouse name: Not on file  . Number of children: 3  . Years of education: Not on file  . Highest education level: Not on file  Occupational History  . Not on file  Tobacco Use  . Smoking status: Former Smoker    Types: Cigarettes    Quit date: 05/13/2014    Years since quitting: 4.9  . Smokeless tobacco: Never Used  . Tobacco comment: social    Substance  and Sexual Activity  . Alcohol use: Yes    Alcohol/week: 0.0 standard drinks    Comment: rare  . Drug use: Never  . Sexual activity: Not Currently    Comment: -1st intercourse 59 yo-More than 5 partners  Other Topics Concern  . Not on file  Social History Narrative  . Not on file   Social Determinants of Health   Financial Resource Strain:   . Difficulty of Paying Living Expenses: Not on file  Food Insecurity:   . Worried About Charity fundraiser in the Last Year: Not on file  . Ran Out of Food in the Last Year: Not on file  Transportation Needs:   . Lack of Transportation (Medical): Not on file  . Lack of Transportation (Non-Medical): Not on file  Physical Activity:   . Days of Exercise per Week: Not on file  . Minutes of Exercise per Session: Not on file  Stress:   . Feeling of Stress : Not on file  Social Connections:   . Frequency of Communication with Friends and Family: Not on file  . Frequency of Social Gatherings with Friends and Family: Not on file  . Attends Religious Services: Not on file  . Active Member of Clubs or Organizations: Not on file  . Attends Archivist Meetings: Not on file  . Marital Status: Not on file  Intimate Partner Violence:   . Fear of Current or Ex-Partner: Not on file  . Emotionally Abused: Not on file  . Physically Abused: Not on file  . Sexually Abused: Not on file    Past Medical History, Surgical history, Social history, and Family history were reviewed and updated as appropriate.   Please see review of systems for further details on the patient's review from today.   Objective:   Physical Exam:  LMP 12/28/2010   Physical Exam Neurological:     Mental Status: She is alert and oriented to person, place, and time.     Cranial Nerves: No dysarthria.  Psychiatric:        Attention and Perception: Attention and perception normal.        Mood and Affect: Mood is depressed. Mood is not anxious.         Speech: Speech normal.        Behavior: Behavior is cooperative.        Thought Content: Thought content normal. Thought content is not paranoid or delusional. Thought content does not include homicidal or suicidal ideation. Thought content does not include homicidal or suicidal plan.        Cognition and Memory: Cognition and memory normal.        Judgment: Judgment normal.     Comments: Insight intact Chronic somatic focus.   Mood worsen within health problems worsen.     Lab Review:     Component Value Date/Time   NA 142 08/01/2016 1713   NA 144 07/12/2015 1521   K 4.7 08/01/2016 1713   K 3.8 07/12/2015 1521   CL 107 08/01/2016 1713   CO2 23 08/01/2016 1713   CO2 27 07/12/2015 1521   GLUCOSE 95 08/01/2016 1713   GLUCOSE 82 07/12/2015 1521   BUN 14 08/01/2016 1713   BUN 10.3 07/12/2015 1521   CREATININE 0.87 08/01/2016 1713   CREATININE 0.8 07/12/2015 1521   CALCIUM 10.0 08/01/2016 1713   CALCIUM 9.9 07/12/2015 1521   PROT 7.4 08/01/2016 1713   PROT 7.4 07/12/2015 1521   ALBUMIN 4.8  08/01/2016 1713   ALBUMIN 4.4 07/12/2015 1521   AST 23 08/01/2016 1713   AST 22 07/12/2015 1521   ALT 26 08/01/2016 1713   ALT 31 07/12/2015 1521   ALKPHOS 55 08/01/2016 1713   ALKPHOS 54 07/12/2015 1521   BILITOT 0.7 08/01/2016 1713   BILITOT 0.40 07/12/2015 1521   GFRNONAA 76 08/01/2016 1713   GFRAA 88 08/01/2016 1713       Component Value Date/Time   WBC 5.9 08/01/2016 1713   RBC 4.71 08/01/2016 1713   HGB 14.6 08/01/2016 1713   HGB 14.1 07/12/2015 1521   HCT 43.1 08/01/2016 1713   HCT 41.5 07/12/2015 1521   PLT 254 08/01/2016 1713   PLT 248 07/12/2015 1521   MCV 91.5 08/01/2016 1713   MCV 92.2 07/12/2015 1521   MCH 31.0 08/01/2016 1713   MCHC 33.9 08/01/2016 1713   RDW 13.2 08/01/2016 1713   RDW 13.3 07/12/2015 1521   LYMPHSABS 2,006 08/01/2016 1713   LYMPHSABS 1.8 07/12/2015 1521   MONOABS 354 08/01/2016 1713   MONOABS 0.7 07/12/2015 1521   EOSABS 118 08/01/2016 1713    EOSABS 0.1 07/12/2015 1521   BASOSABS 0 08/01/2016 1713   BASOSABS 0.0 07/12/2015 1521    No results found for: POCLITH, LITHIUM   No results found for: PHENYTOIN, PHENOBARB, VALPROATE, CBMZ   .res Assessment: Plan:    [place] was seen today for follow-up, other, anxiety and medication problem.  Diagnoses and all orders for this visit:  Bipolar II disorder ([place])  PTSD (post-traumatic stress disorder)  Generalized anxiety disorder -     LORazepam (ATIVAN) 1 MG tablet; Take 1 tablet (1 mg total) by mouth every 8 (eight) hours as needed for anxiety. (takes 2 mg when having a medical procedure.)  Panic disorder with agoraphobia -     LORazepam (ATIVAN) 1 MG tablet; Take 1 tablet (1 mg total) by mouth every 8 (eight) hours as needed for anxiety. (takes 2 mg when having a medical procedure.)  Attention deficit hyperactivity disorder (ADHD), combined type -     methylphenidate (RITALIN) 5 MG tablet; Take 1 tablet (5 mg total) by mouth 2 (two) times daily with breakfast and lunch.  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  Need more time to reevaluate this and disc risk mania and mood cycling.  Discussed continued risk of hypomanic symptoms.  She has had symptoms consistent with hypomania in the past but there is now some question as to whether they were PTSD related rather than truly bipolar 2.  Her son has had treatment resistant depression with possible bipolar disorder and her daughter has been diagnosed with bipolar disorder but also a history of borderline personality disorder.  So there is a family history of the spectrum.   Disc lamotrigine and her skin dx.  Mood has been relatively ok off the lamotrigine.  Greater risk will occur in the fall with seasonal depression by history.  It is now not 100% clear whether the lamotrigine  was the cause of her skin reaction or if it was perhaps desipramine because she continued to have some skin issues even off the lamotrigine until desipramine was stopped.  Stress management for mood sx. Health problems worsen mood episodically.    Disc history of panic and agoraphobia and Covid could strengthen the agoraphobia.  She doesn't go  out much and prefers to stay in the house.  Need to work on maintaining independence by pushing outside activity.  We discussed the short-term risks associated with benzodiazepines including sedation and increased fall risk among others.  Discussed long-term side effect risk including dependence, potential withdrawal symptoms, and the potential eventual dose-related risk of dementia.  Keep this at a minimum given cognitive concerns.  She only uses it when triggered with anxiety usually medical.  She is not using it regularly.  Retry Disc the off-label use of N-Acetylcysteine at 600 mg daily to help with mild cognitive problems.  It can be combined with a B-complex vitamin as the B-12 and folate have been shown to sometimes enhance the effect.  Disc use of lower dose bc had HA in the past.  She is afraid to try it because of recent headache issues.  Discussed potential benefits, risks, and side effects of stimulants with patient to include increased heart rate, palpitations, insomnia, increased anxiety, increased irritability, or decreased appetite.  Instructed patient to contact office if experiencing any significant tolerability issues. She plans to Continue Ritalin 5 BID or 10 mg AM for cognitive and ADD reasons and disc risk of palpitations.  Greater than 50% of 30 mins of webex face to face time with patient was spent on counseling and coordination of care.   FU 3-4 mos  Lynder Parents, MD, DFAPA    Please see After Visit Summary for patient specific instructions.  No future appointments.  No orders of the defined types were placed in this  encounter.     -------------------------------

## 2019-04-08 DIAGNOSIS — F3181 Bipolar II disorder: Secondary | ICD-10-CM | POA: Diagnosis not present

## 2019-04-08 DIAGNOSIS — E7800 Pure hypercholesterolemia, unspecified: Secondary | ICD-10-CM | POA: Diagnosis not present

## 2019-04-08 DIAGNOSIS — M797 Fibromyalgia: Secondary | ICD-10-CM | POA: Diagnosis not present

## 2019-04-08 DIAGNOSIS — F411 Generalized anxiety disorder: Secondary | ICD-10-CM | POA: Diagnosis not present

## 2019-04-08 DIAGNOSIS — D472 Monoclonal gammopathy: Secondary | ICD-10-CM | POA: Diagnosis not present

## 2019-04-08 DIAGNOSIS — G43909 Migraine, unspecified, not intractable, without status migrainosus: Secondary | ICD-10-CM | POA: Diagnosis not present

## 2019-04-10 DIAGNOSIS — F3181 Bipolar II disorder: Secondary | ICD-10-CM | POA: Diagnosis not present

## 2019-04-24 DIAGNOSIS — F3181 Bipolar II disorder: Secondary | ICD-10-CM | POA: Diagnosis not present

## 2019-05-08 DIAGNOSIS — F3181 Bipolar II disorder: Secondary | ICD-10-CM | POA: Diagnosis not present

## 2019-05-13 DIAGNOSIS — M797 Fibromyalgia: Secondary | ICD-10-CM | POA: Diagnosis not present

## 2019-05-13 DIAGNOSIS — F900 Attention-deficit hyperactivity disorder, predominantly inattentive type: Secondary | ICD-10-CM | POA: Diagnosis not present

## 2019-05-13 DIAGNOSIS — G43909 Migraine, unspecified, not intractable, without status migrainosus: Secondary | ICD-10-CM | POA: Diagnosis not present

## 2019-05-13 DIAGNOSIS — K589 Irritable bowel syndrome without diarrhea: Secondary | ICD-10-CM | POA: Diagnosis not present

## 2019-05-13 DIAGNOSIS — F3181 Bipolar II disorder: Secondary | ICD-10-CM | POA: Diagnosis not present

## 2019-05-15 ENCOUNTER — Ambulatory Visit: Payer: Medicare Other | Attending: Internal Medicine

## 2019-05-15 DIAGNOSIS — Z23 Encounter for immunization: Secondary | ICD-10-CM

## 2019-05-15 NOTE — Progress Notes (Signed)
   Covid-19 Vaccination Clinic  Name:  Danielle Bruce    MRN: [id number] DOB: 05-Feb-1961  05/15/2019  Ms. Danielle Bruce was observed post Covid-19 immunization for 15 minutes without incident. She was provided with Vaccine Information Sheet and instruction to access the V-Safe system.   Ms. Danielle Bruce was instructed to call 911 with any severe reactions post vaccine: Marland Kitchen Difficulty breathing  . Swelling of face and throat  . A fast heartbeat  . A bad rash all over body  . Dizziness and weakness   Immunizations Administered    Name Date Dose VIS Date Route   Pfizer COVID-19 Vaccine 05/15/2019 12:48 PM 0.3 mL 02/07/2019 Intramuscular   Manufacturer: [place]   Lot: [id number]   [place]: [id number]

## 2019-05-22 DIAGNOSIS — F3181 Bipolar II disorder: Secondary | ICD-10-CM | POA: Diagnosis not present

## 2019-05-25 ENCOUNTER — Other Ambulatory Visit: Payer: Self-pay | Admitting: Psychiatry

## 2019-06-05 DIAGNOSIS — F3181 Bipolar II disorder: Secondary | ICD-10-CM | POA: Diagnosis not present

## 2019-06-09 ENCOUNTER — Ambulatory Visit: Payer: Medicare Other | Attending: Internal Medicine

## 2019-06-09 DIAGNOSIS — Z23 Encounter for immunization: Secondary | ICD-10-CM

## 2019-06-09 NOTE — Progress Notes (Signed)
   Covid-19 Vaccination Clinic  Name:  Danielle Bruce    MRN: [id number] DOB: [birth year]  06/09/2019  Danielle Bruce was observed post Covid-19 immunization for 15 minutes without incident. She was provided with Vaccine Information Sheet and instruction to access the V-Safe system.   Danielle Bruce was instructed to call 911 with any severe reactions post vaccine: Marland Kitchen Difficulty breathing  . Swelling of face and throat  . A fast heartbeat  . A bad rash all over body  . Dizziness and weakness   Immunizations Administered    Name Date Dose VIS Date Route   Pfizer COVID-19 Vaccine 06/09/2019  2:13 PM 0.3 mL 02/07/2019 Intramuscular   Manufacturer: [place]   Lot: [id number]   [place]: [id number]

## 2019-06-11 ENCOUNTER — Ambulatory Visit: Payer: Medicare Other

## 2019-06-19 DIAGNOSIS — F3181 Bipolar II disorder: Secondary | ICD-10-CM | POA: Diagnosis not present

## 2019-06-24 DIAGNOSIS — G43909 Migraine, unspecified, not intractable, without status migrainosus: Secondary | ICD-10-CM | POA: Diagnosis not present

## 2019-06-24 DIAGNOSIS — F3181 Bipolar II disorder: Secondary | ICD-10-CM | POA: Diagnosis not present

## 2019-06-24 DIAGNOSIS — D472 Monoclonal gammopathy: Secondary | ICD-10-CM | POA: Diagnosis not present

## 2019-06-24 DIAGNOSIS — R0989 Other specified symptoms and signs involving the circulatory and respiratory systems: Secondary | ICD-10-CM | POA: Diagnosis not present

## 2019-06-24 DIAGNOSIS — K589 Irritable bowel syndrome without diarrhea: Secondary | ICD-10-CM | POA: Diagnosis not present

## 2019-06-24 DIAGNOSIS — J309 Allergic rhinitis, unspecified: Secondary | ICD-10-CM | POA: Diagnosis not present

## 2019-06-24 DIAGNOSIS — F900 Attention-deficit hyperactivity disorder, predominantly inattentive type: Secondary | ICD-10-CM | POA: Diagnosis not present

## 2019-06-24 DIAGNOSIS — G5600 Carpal tunnel syndrome, unspecified upper limb: Secondary | ICD-10-CM | POA: Diagnosis not present

## 2019-06-24 DIAGNOSIS — M797 Fibromyalgia: Secondary | ICD-10-CM | POA: Diagnosis not present

## 2019-06-24 DIAGNOSIS — R7301 Impaired fasting glucose: Secondary | ICD-10-CM | POA: Diagnosis not present

## 2019-06-24 DIAGNOSIS — E7800 Pure hypercholesterolemia, unspecified: Secondary | ICD-10-CM | POA: Diagnosis not present

## 2019-06-25 ENCOUNTER — Other Ambulatory Visit: Payer: Self-pay

## 2019-06-25 ENCOUNTER — Encounter: Payer: Self-pay | Admitting: Psychiatry

## 2019-06-25 ENCOUNTER — Ambulatory Visit (INDEPENDENT_AMBULATORY_CARE_PROVIDER_SITE_OTHER): Payer: Medicare Other | Admitting: Psychiatry

## 2019-06-25 DIAGNOSIS — F4001 Agoraphobia with panic disorder: Secondary | ICD-10-CM

## 2019-06-25 DIAGNOSIS — F40241 Acrophobia: Secondary | ICD-10-CM

## 2019-06-25 DIAGNOSIS — F4310 Post-traumatic stress disorder, unspecified: Secondary | ICD-10-CM

## 2019-06-25 DIAGNOSIS — F902 Attention-deficit hyperactivity disorder, combined type: Secondary | ICD-10-CM | POA: Diagnosis not present

## 2019-06-25 DIAGNOSIS — F411 Generalized anxiety disorder: Secondary | ICD-10-CM | POA: Diagnosis not present

## 2019-06-25 DIAGNOSIS — F422 Mixed obsessional thoughts and acts: Secondary | ICD-10-CM

## 2019-06-25 MED ORDER — FLUVOXAMINE MALEATE 25 MG PO TABS: 25.0000 mg | ORAL_TABLET | Freq: Two times a day (BID) | ORAL | 0 refills | Status: DC

## 2019-06-25 NOTE — Progress Notes (Signed)
Office Visit Note  Patient: Danielle Bruce             Date of Birth: 1960-04-20           MRN: [id number]             PCP: Mayra Neer, MD Referring: Mayra Neer, MD Visit Date: 06/30/2019 Occupation: @GUAROCC @  Subjective:  Right hand paresthesia  History of Present Illness: Danielle Bruce is a 59 y.o. female with history of fibromyalgia and osteoarthritis.  Patient presents she continues to have intermittent flares of fibromyalgia.  She has generalized muscle aches and muscle tenderness.  She continues to have pain in both hands, both hips, both knees, and both feet.  She denies any joint swelling.  She states that she has had worsening depression over the past several months during the COVID-19 pandemic.  She states that her depression has been worsening her fatigue.  She has not been exercising like she should due to the increased fatigue.  She has started to perform yard work which has been exacerbating some of her discomfort including thoracic spinal pain.  She takes methocarbamol as needed and uses Voltaren gel topically.  She states that she was recently started on Flexeril by her PCP which she takes at bedtime.  She states that the Flexeril is helped with insomnia.  She states she typically sleeps about 8 hours per night. She has been experiencing right hand paresthesias on a daily basis.  Her discomfort is exacerbated by performing yard work.  She has been wearing a carpal tunnel night splint at night which has been helping her symptoms. She continues to receive Prolia 60 mg subcu injections every 6 months at her PCPs office.  She has an upcoming injection in July 2021.     Activities of Daily Living:  Patient reports morning stiffness for 15 minutes.   Patient Reports nocturnal pain.  Difficulty dressing/grooming: Denies Difficulty climbing stairs: Reports Difficulty getting out of chair: Reports Difficulty using hands for taps, buttons, cutlery, and/or writing:  Reports  Review of Systems  Constitutional: Positive for fatigue.  HENT: Positive for mouth dryness. Negative for mouth sores and nose dryness.   Eyes: Positive for dryness. Negative for pain and visual disturbance.  Respiratory: Negative for cough, hemoptysis and difficulty breathing.   Cardiovascular: Negative for chest pain, palpitations, hypertension and swelling in legs/feet.  Gastrointestinal: Positive for constipation and diarrhea. Negative for blood in stool.  Endocrine: Negative for increased urination.  Genitourinary: Negative for difficulty urinating and painful urination.  Musculoskeletal: Positive for arthralgias, joint pain, muscle weakness, morning stiffness and muscle tenderness. Negative for joint swelling, myalgias and myalgias.  Skin: Negative for color change, pallor, rash, hair loss, nodules/bumps, skin tightness, ulcers and sensitivity to sunlight.  Allergic/Immunologic: Negative for susceptible to infections.  Neurological: Positive for parasthesias (Right hand). Negative for dizziness and headaches.  Hematological: Negative for swollen glands.  Psychiatric/Behavioral: Positive for sleep disturbance. Negative for depressed mood. The patient is not nervous/anxious.     PMFS History:  Patient Active Problem List   Diagnosis Date Noted  . GAD (generalized anxiety disorder) 12/26/2017  . OCD (obsessive compulsive disorder) 12/26/2017  . PTSD (post-traumatic stress disorder) 12/26/2017  . DDD (degenerative disc disease), cervical 07/14/2016  . Primary osteoarthritis of both feet 07/14/2016  . Primary osteoarthritis of both hands 07/14/2016  . Other fatigue 07/14/2016  . History of IBS 07/14/2016  . Osteopenia of multiple sites 07/14/2016  . Fibromyalgia 01/13/2016  . MGUS (  monoclonal gammopathy of unknown significance) 12/23/2013  . Chronic cholecystitis without calculus 10/18/2012  . Abdominal pain, unspecified site 10/18/2012  . Nausea alone 10/18/2012  .  Unspecified constipation 10/18/2012  . Depression 10/18/2012  . Anxiety   . ASCUS (atypical squamous cells of undetermined significance) on Pap smear   . IUD   . Hemorrhoids 11/29/2010  . Abdominal pain, left upper quadrant 11/29/2010    Past Medical History:  Diagnosis Date  . Anemia   . Anxiety   . Arthritis    osteoarthritis  . ASCUS (atypical squamous cells of undetermined significance) on Pap smear 05/06/05   NEG HIGH RISK HPV--C&B BIOPSY BENIGN 12/2005  . Asthma   . Bipolar 2 disorder ([place])   . Cancer ([place])    skin cancer - basal cell  . Colon polyps   . Complication of anesthesia    anxious afterwards, will get headaches   . Constipation   . Depression   . Fibromyalgia 10/2013  . GERD (gastroesophageal reflux disease)   . 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 Heart murmur    never had any problems  . Hemorrhoids   . High cholesterol   . 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   cytology negative  . IBS (irritable bowel syndrome)   . Insomnia   . Lymphocytic colitis   . Lymphoma ([place])   . 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 Migraines   . Osteoarthritis   . Osteopenia   . Peripheral neuropathy   . PTSD (post-traumatic stress disorder)     Family History  Problem Relation Age of Onset  . Diabetes Father   . Hypertension Father   . Hyperlipidemia Father   . Heart disease Maternal Grandfather   . Heart disease Paternal Grandmother   . Heart disease Paternal Grandfather   . Depression Son   . Depression Son   . Anxiety disorder Son   . Depression Daughter   . Anxiety disorder Daughter   . Asthma Daughter    Past Surgical History:  Procedure Laterality Date  . BONE MARROW BIOPSY Left 12/18/2013   Plasma cell dyscrasia 8% population of plasma cells  . BREAST BIOPSY Right    benign stereo  . CESAREAN SECTION  [phone]  . CHOLECYSTECTOMY N/A 10/16/2012   Procedure: LAPAROSCOPIC  CHOLECYSTECTOMY;  Surgeon: Joyice Faster. Cornett, MD;  Location: [facility];  Service: General;  Laterality: N/A;  . COLONOSCOPY     numerous times  . DILATION AND CURETTAGE OF UTERUS    . ESOPHAGOGASTRODUODENOSCOPY    . Hana   x3  . IUD REMOVAL  02/2015   Mirena  . LAPAROSCOPIC LYSIS OF ADHESIONS N/A 10/16/2012   Procedure: LAPAROSCOPIC LYSIS OF ADHESIONS;  Surgeon: Joyice Faster. Cornett, MD;  Location: [facility];  Service: General;  Laterality: N/A;  . LAPAROSCOPY N/A 10/16/2012   Procedure: LAPAROSCOPY DIAGNOSTIC;  Surgeon: Joyice Faster. Cornett, MD;  Location: [facility];  Service: General;  Laterality: N/A;  . PELVIC LAPAROSCOPY    . RADIOLOGY WITH ANESTHESIA N/A 12/16/2015   Procedure: MRI OF BRAIN WITH AND WITHOUT CONTRAST;  Surgeon: Medication Radiologist, MD;  Location: [place];  Service: Radiology;  Laterality: N/A;  . SHOULDER SURGERY  2007/2008  . SPINE SURGERY  2010   cervical   Social History   Social History Narrative  . Not on file   Immunization History  Administered Date(s) Administered  .  Influenza-Unspecified 11/20/2013  . PFIZER SARS-COV-2 Vaccination 05/15/2019, 06/09/2019     Objective: Vital Signs: BP 104/62 (BP Location: Left Arm, Patient Position: Sitting, Cuff Size: Normal)   Pulse 69   Resp 14   Ht 5' 2"  (1.575 m)   Wt 138 lb (62.6 kg)   LMP 12/28/2010   BMI 25.24 kg/m    Physical Exam Vitals and nursing note reviewed.  Constitutional:      Appearance: She is well-developed.  HENT:     Head: Normocephalic and atraumatic.  Eyes:     Conjunctiva/sclera: Conjunctivae normal.  Pulmonary:     Effort: Pulmonary effort is normal.  Abdominal:     General: Bowel sounds are normal.     Palpations: Abdomen is soft.  Musculoskeletal:     Cervical back: Normal range of motion.  Lymphadenopathy:     Cervical: No cervical adenopathy.  Skin:    General: Skin is warm and dry.     Capillary Refill: Capillary refill takes less than 2 seconds.    Neurological:     Mental Status: She is alert and oriented to person, place, and time.  Psychiatric:        Behavior: Behavior normal.      Musculoskeletal Exam: C-spine, thoracic spine, lumbar spine good range of motion.  She has midline spinal tenderness in the thoracic region.  Tenderness over bilateral SI joints.  Shoulder joints, elbow joints, wrist joints, MCPs, PIPs, DIPs good range of motion with no synovitis.  She has complete fist formation bilaterally.  Hip joints, knee joints, ankle joints, MTPs, PIPs, DIPs good range of motion with no synovitis.  No warmth or effusion of bilateral knee joints.  No tenderness or swelling of ankle joints.  She has tenderness over trochanteric bursa bilaterally.  CDAI Exam: CDAI Score: -- Patient Global: --; Provider Global: -- Swollen: --; Tender: -- Joint Exam 06/30/2019   No joint exam has been documented for this visit   There is currently no information documented on the homunculus. Go to the Rheumatology activity and complete the homunculus joint exam.  Investigation: No additional findings.  Imaging: No results found.  Recent Labs: Lab Results  Component Value Date   WBC 5.9 08/01/2016   HGB 14.6 08/01/2016   PLT 254 08/01/2016   NA 142 08/01/2016   K 4.7 08/01/2016   CL 107 08/01/2016   CO2 23 08/01/2016   GLUCOSE 95 08/01/2016   BUN 14 08/01/2016   CREATININE 0.87 08/01/2016   BILITOT 0.7 08/01/2016   ALKPHOS 55 08/01/2016   AST 23 08/01/2016   ALT 26 08/01/2016   PROT 7.4 08/01/2016   ALBUMIN 4.8 08/01/2016   CALCIUM 10.0 08/01/2016   GFRAA 88 08/01/2016    Speciality Comments: No specialty comments available.  Procedures:  No procedures performed Allergies: Sulfa antibiotics, Gluten meal, Lactose intolerance (gi), Flagyl [metronidazole hcl], and Morphine and related   Assessment / Plan:     Visit Diagnoses: Fibromyalgia: She has generalized hyperalgesia and positive tender points on exam.  She continues to  have frequent fibromyalgia flares.  She has been experiencing worsening fatigue secondary to depression.  Her depression has been improving since making some medication changes recently.  She has been sleeping about 8 hours most nights.  She has started taking Flexeril at bedtime prescribed by her PCP which has been helping with myalgias and insomnia.  She has been more sedentary recently due to increased fatigue.  We discussed the importance of regular exercise and good  sleep hygiene.  She will continue to take Robaxin and Flexeril as prescribed.  She will follow-up in the office in 6 months.  Other fatigue: She has chronic fatigue secondary to depression and insomnia.  Discussed importance of regular exercise.  Other insomnia: She has difficulty falling asleep and has been staying asleep since starting on Flexeril at bedtime.  Good sleep hygiene was discussed.  Primary osteoarthritis of both hands: She has mild PIP and DIP thickening consistent with osteoarthritis of both hands.  She has complete fist formation bilaterally.  No synovitis was noted.  Joint protection and muscle strengthening were discussed.  Primary osteoarthritis of both feet: She experiences chronic pain in both feet.  No inflammation was noted.  She wears proper fitting shoes.  Trochanteric bursitis of both hips: She has tenderness over bilateral trochanteric bursa.  She is encouraged to perform stretching exercises daily.  Osteopenia of multiple sites: She is receiving Prolia 60 mg subcutaneous injections every 6 months by her PCP.  She is due for her next Prolia injection in July 2021 according to the patient.  DDD (degenerative disc disease), cervical: She has limited lateral rotation bilaterally.  She is not experiencing any symptoms radiculopathy at this time.  She takes methocarbamol as needed for muscle spasms and muscle tension.  She has also been using Voltaren gel topically as needed.  Right hand paresthesia - She has  been experiencing right hand paresthesias on a daily basis.  Her discomfort has been exacerbated by recent yardwork.  She has been wearing a carpal tunnel night splints as needed which has been helpful.  We discussed the importance of continue to use the night splint as well as Voltaren gel topically.  A referral to Dr. Ernestina Patches for a nerve conduction study was placed today.  Plan: Ambulatory referral to Physical Medicine Rehab  Other medical conditions are listed as follows:   Chronic cholecystitis without calculus  Lymphocytic colitis  History of posttraumatic stress disorder (PTSD)  Bipolar 2 disorder ([facility])  History of IBS  MGUS (monoclonal gammopathy of unknown significance)    Orders: Orders Placed This Encounter  Procedures  . Ambulatory referral to Physical Medicine Rehab   No orders of the defined types were placed in this encounter.   Face-to-face time spent with patient was 30 minutes. Greater than 50% of time was spent in counseling and coordination of care.  Follow-Up Instructions: Return in about 6 months (around 12/31/2019) for Fibromyalgia.   Ofilia Neas, PA-C  Note - This record has been created using Dragon software.  Chart creation errors have been sought, but may not always  have been located. Such creation errors do not reflect on  the standard of medical care.

## 2019-06-25 NOTE — Progress Notes (Signed)
Danielle Bruce [id number] Feb 14, 1961 59 y.o.   Subjective:   Patient ID:  Danielle Bruce is a 59 y.o. (DOB 12-20-1960) female.  Chief Complaint:  Chief Complaint  Patient presents with  . Follow-up  . Depression  . Anxiety  . ADHD  . Sleeping Problem    HPI Danielle Bruce presents for follow-up of multiple dxes.  visit in April and taken off lamotrigine DT possible skin reaction.  Had appt with [facility] dermatology and they assume that skin reaction with itching was related to lamotrigine. 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 Per oncology most likely drug is lamotrigine. And she weaned off of the medication to determine if the cutaneous lesions are related. Lesions seem less without lamotrigine.  At this point skin reaction is presumed related to lamotrigine.  No other disease sx. She is well aware that she has been on lamotrigine for many years with good results for depression control. There is a risk of relapse as she comes off of the medication and she is worried about that. Smart phone helps her cognitive  And other productivity.    seen October 2020.  Restarted low dosage ADD bc concerns over STM, concentration and productivity.  Ritalin 5 mg twice daily.  Also suggested she retry N-acetylcysteine for mild cognitive complaints.  Last seen April 07, 2019 the following was noted: Mostly good.  Anxiety is mostly better, but depression is a little worse seasonally.  Not able to go help her daughter.   Low energy but does have motivation.  Ritalin helps energy and focus but doesn't take it regularly bc irregular sleep cycle.   Ativan only when having medical issues.    Therapy working on trauma issues is very hard.  Struggling with the fact she doesn't have memory of large parts of her past.   No manic sx off mood stabilizers.  Covid isolation reduce  stressors overall and not prominently depressed.   No meds were changed.  June 25, 2019 appointment the following is noted: F real worried about Covid despite vaccination.   Started fluvoxamine 25 on May 14, 2019 from PCP mainly for inflammation but possibly thinking she might be depressed. Seen benefit for energy and sleep pattern and more appropriate feelings and less numb.  Now realizes she had been depressed for awhile but just numb with depression.  Anniversaries of deaths of friends including last BF [name] a little over a year ago.   Thinks of him daily. His 57 yo D died of unknown cause recently.  [place]. Wonders about increasing the fluvoxamine  [name] living in [place] and hasn't seen him in a year. [name] on disability for depression.  Patient reports more depression.  Patient reports panic recently and recent difficulty with anxiety.    Occ flashbacks.   Patient denies difficulty with sleep initiation or maintenance. Denies appetite disturbance.  Patient reports that energy and motivation have been good.  Patient denies any difficulty with concentration.  Patient denies any suicidal ideation.   Past psych meds: Lithium, carbamazepine, oxcarbazepine, topiramate, valproic acid, lamotrigine, gabapentin  Zyprexa, Seroquel, Latuda headaches, Geodon,  Risperidone, Stelazine,  Rexulti,  Wellbutrin with side effects,  sertraline irritability, duloxetine, fluvoxamine 25, fluoxetine, Stopped desipramine DT skin issues which now resolved.  It helped GI px.  buspirone side effects,   N-acetylcysteine, pramipexole,  methylphenidate,  Adderall,   Ambien with  amnesia, trazodone, Ativan  Review of Systems:  Review of Systems  Constitutional: Positive for fatigue.  Gastrointestinal: Positive for abdominal distention and abdominal pain. Negative for vomiting.  Musculoskeletal: Positive for myalgias.  Neurological: Positive for dizziness and headaches. Negative for tremors and  weakness.  Psychiatric/Behavioral: Positive for dysphoric mood. Negative for agitation, behavioral problems, confusion, hallucinations, self-injury and suicidal ideas. The patient is nervous/anxious.   Less HA with new meds.  Medications: I have reviewed the patient's current medications.  Current Outpatient Medications  Medication Sig Dispense Refill  . albuterol (ACCUNEB) 0.63 MG/3ML nebulizer solution Take 1 ampule by nebulization every 6 (six) hours as needed for wheezing.    Marland Kitchen albuterol (PROVENTIL HFA;VENTOLIN HFA) 108 (90 Base) MCG/ACT inhaler Inhale 2 puffs into the lungs every 6 (six) hours as needed for wheezing or shortness of breath.    . Ascorbic Acid (VITAMIN C) 100 MG tablet Take 100 mg by mouth daily.    Marland Kitchen azelastine (ASTELIN) 0.1 % nasal spray Place into both nostrils 2 (two) times daily. Use in each nostril as directed    . BEPREVE 1.5 % SOLN Place 1 drop into both eyes daily.     . diphenhydrAMINE (BENADRYL) 25 mg capsule Take 50 mg by mouth every 6 (six) hours as needed (HEADACHES).     Marland Kitchen EPINEPHrine 0.3 mg/0.3 mL IJ SOAJ injection as needed.    Eduard Roux (AIMOVIG) 70 MG/ML SOAJ Inject into the skin.    . fluticasone (FLONASE) 50 MCG/ACT nasal spray Place 2 sprays into both nostrils daily. 16 g 0  . fluvoxaMINE (LUVOX) 25 MG tablet Take 1 tablet (25 mg total) by mouth 2 (two) times daily. Started May 14, 2019 180 tablet 0  . hydrOXYzine (ATARAX/VISTARIL) 25 MG tablet TK 1 TO 2 TS PO TID PRF MIGRAINE  4  . L-methylfolate Calcium 15 MG TABS TAKE 1 TABLET BY MOUTH DAILY 30 tablet 5  . levocetirizine (XYZAL) 5 MG tablet Take 5 mg by mouth every evening.  5  . LORazepam (ATIVAN) 1 MG tablet Take 1 tablet (1 mg total) by mouth every 8 (eight) hours as needed for anxiety. (takes 2 mg when having a medical procedure.) 30 tablet 1  . LORazepam (ATIVAN) 2 MG/ML concentrated solution Take 0.5 mLs (1 mg total) by mouth every 8 (eight) hours as needed for anxiety (severe panic).  30 mL 1  . MELATONIN PO Take 1.5 mg by mouth at bedtime.    . methylphenidate (RITALIN) 5 MG tablet Take 1 tablet (5 mg total) by mouth 2 (two) times daily with breakfast and lunch. 60 tablet 0  . montelukast (SINGULAIR) 10 MG tablet Take 10 mg by mouth at bedtime.    Marland Kitchen oxyCODONE-acetaminophen (PERCOCET/ROXICET) 5-325 MG per tablet Take 2 tablets by mouth daily as needed for moderate pain or severe pain (mighraine.).     Marland Kitchen polyethylene glycol (MIRALAX / GLYCOLAX) packet Take 17 g by mouth daily as needed for mild constipation.     Marland Kitchen PRAVASTATIN SODIUM PO Take 40 mg by mouth.     . predniSONE (DELTASONE) 5 MG tablet Take 2.5-5 mg by mouth daily as needed.    Marland Kitchen PROLIA 60 MG/ML SOSY injection BRING TO THE OFFICE FOR INJECTION ON 02/15/2018 AS DIRECTED ONCE EVERY 6 MONTHS    . promethazine (PHENERGAN) 25 MG tablet Take 25 mg by mouth every 6 (six) hours as needed for nausea.     . risperiDONE (RISPERDAL) 0.25 MG tablet Take 0.25 mg by mouth  daily as needed. As needed for an anxiety attack     . rizatriptan (MAXALT) 10 MG tablet Take 10 mg by mouth as needed.   4  . traZODone (DESYREL) 50 MG tablet TAKE 1 TABLET(50 MG) BY MOUTH AT BEDTIME 90 tablet 0  . Ubrogepant (UBRELVY) 50 MG TABS Take 50-100 mg by mouth daily as needed.    Marland Kitchen VASCEPA 1 g CAPS TK 2 CS PO BID WC  3  . VITAMIN D PO Take by mouth. Take 5034m daily    . VOLTAREN 1 % GEL Apply 2 g topically 4 (four) times daily as needed (JOINT PAIN).   3   No current facility-administered medications for this visit.    Medication Side Effects: None  Allergies:  Allergies  Allergen Reactions  . Sulfa Antibiotics Nausea And Vomiting    Causes pt to vomit blood  . Gluten Meal Other (See Comments)    Sensitivity.   . Lactose Intolerance (Gi) Nausea And Vomiting  . Flagyl [Metronidazole Hcl] Nausea And Vomiting  . Morphine And Related Other (See Comments)    Reaction: Depression, emotional    Past Medical History:  Diagnosis Date  .  Anemia   . Anxiety   . Arthritis    osteoarthritis  . ASCUS (atypical squamous cells of undetermined significance) on Pap smear 05/06/05   NEG HIGH RISK HPV--C&B BIOPSY BENIGN 12/2005  . Asthma   . [place] 2 disorder [place]   . Cancer ([facility]    skin cancer - basal cell  . Colon polyps   . Complication of anesthesia    anxious afterwards, will get headaches   . Constipation   . Depression   . Fibromyalgia 10/2013  . GERD (gastroesophageal reflux disease)   . 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 Heart murmur    never had any problems  . Hemorrhoids   . High cholesterol   . 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   cytology negative  . IBS (irritable bowel syndrome)   . Insomnia   . Lymphocytic colitis   . [place] [place]   . 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 Migraines   . Osteoarthritis   . Osteopenia   . Peripheral neuropathy   . PTSD (post-traumatic stress disorder)     Family History  Problem Relation Age of Onset  . Diabetes Father   . Hypertension Father   . Hyperlipidemia Father   . Heart disease Maternal Grandfather   . Heart disease Paternal Grandmother   . Heart disease Paternal Grandfather   . Depression Son   . Depression Son   . Anxiety disorder Son   . Depression Daughter   . Anxiety disorder Daughter   . Asthma Daughter     Social History   Socioeconomic History  . Marital status: Divorced    Spouse name: Not on file  . Number of children: 3  . Years of education: Not on file  . Highest education level: Not on file  Occupational History  . Not on file  Tobacco Use  . Smoking status: Former Smoker    Types: Cigarettes    Quit date: 05/13/2014    Years since quitting: 5.1  . Smokeless tobacco: Never Used  . Tobacco comment: social   Substance and Sexual Activity  . Alcohol use: Yes    Alcohol/week: 0.0 standard drinks    Comment: rare  . Drug use:  Never  . Sexual  activity: Not Currently    Comment: -1st intercourse 59 yo-More than 5 partners  Other Topics Concern  . Not on file  Social History Narrative  . Not on file   Social Determinants of Health   Financial Resource Strain:   . Difficulty of Paying Living Expenses:   Food Insecurity:   . Worried About Charity fundraiser in the Last Year:   . Arboriculturist in the Last Year:   Transportation Needs:   . Film/video editor (Medical):   Marland Kitchen Lack of Transportation (Non-Medical):   Physical Activity:   . Days of Exercise per Week:   . Minutes of Exercise per Session:   Stress:   . Feeling of Stress :   Social Connections:   . Frequency of Communication with Friends and Family:   . Frequency of Social Gatherings with Friends and Family:   . Attends Religious Services:   . Active Member of Clubs or Organizations:   . Attends Archivist Meetings:   Marland Kitchen Marital Status:   Intimate Partner Violence:   . Fear of Current or Ex-Partner:   . Emotionally Abused:   Marland Kitchen Physically Abused:   . Sexually Abused:     Past Medical History, Surgical history, Social history, and Family history were reviewed and updated as appropriate.   Please see review of systems for further details on the patient's review from today.   Objective:   Physical Exam:  LMP 12/28/2010   Physical Exam Constitutional:      General: She is not in acute distress. Musculoskeletal:        General: No deformity.  Neurological:     Mental Status: She is alert and oriented to person, place, and time.     Cranial Nerves: No dysarthria.     Coordination: Coordination normal.  Psychiatric:        Attention and Perception: Attention and perception normal. She does not perceive auditory or visual hallucinations.        Mood and Affect: Mood is anxious and depressed. Affect is not labile, blunt, angry or inappropriate.        Speech: Speech normal.        Behavior: Behavior normal. Behavior is cooperative.         Thought Content: Thought content normal. Thought content is not paranoid or delusional. Thought content does not include homicidal or suicidal ideation. Thought content does not include homicidal or suicidal plan.        Cognition and Memory: Cognition and memory normal.        Judgment: Judgment normal.     Comments: Insight intact More down. Mood worsen within health problems worsen.     Lab Review:     Component Value Date/Time   NA 142 08/01/2016 1713   NA 144 07/12/2015 1521   K 4.7 08/01/2016 1713   K 3.8 07/12/2015 1521   CL 107 08/01/2016 1713   CO2 23 08/01/2016 1713   CO2 27 07/12/2015 1521   GLUCOSE 95 08/01/2016 1713   GLUCOSE 82 07/12/2015 1521   BUN 14 08/01/2016 1713   BUN 10.3 07/12/2015 1521   CREATININE 0.87 08/01/2016 1713   CREATININE 0.8 07/12/2015 1521   CALCIUM 10.0 08/01/2016 1713   CALCIUM 9.9 07/12/2015 1521   PROT 7.4 08/01/2016 1713   PROT 7.4 07/12/2015 1521   ALBUMIN 4.8 08/01/2016 1713   ALBUMIN 4.4 07/12/2015 1521   AST 23 08/01/2016 1713  AST 22 07/12/2015 1521   ALT 26 08/01/2016 1713   ALT 31 07/12/2015 1521   ALKPHOS 55 08/01/2016 1713   ALKPHOS 54 07/12/2015 1521   BILITOT 0.7 08/01/2016 1713   BILITOT 0.40 07/12/2015 1521   GFRNONAA 76 08/01/2016 1713   GFRAA 88 08/01/2016 1713       Component Value Date/Time   WBC 5.9 08/01/2016 1713   RBC 4.71 08/01/2016 1713   HGB 14.6 08/01/2016 1713   HGB 14.1 07/12/2015 1521   HCT 43.1 08/01/2016 1713   HCT 41.5 07/12/2015 1521   PLT 254 08/01/2016 1713   PLT 248 07/12/2015 1521   MCV 91.5 08/01/2016 1713   MCV 92.2 07/12/2015 1521   MCH 31.0 08/01/2016 1713   MCHC 33.9 08/01/2016 1713   RDW 13.2 08/01/2016 1713   RDW 13.3 07/12/2015 1521   LYMPHSABS 2,006 08/01/2016 1713   LYMPHSABS 1.8 07/12/2015 1521   MONOABS 354 08/01/2016 1713   MONOABS 0.7 07/12/2015 1521   EOSABS 118 08/01/2016 1713   EOSABS 0.1 07/12/2015 1521   BASOSABS 0 08/01/2016 1713   BASOSABS 0.0 07/12/2015  1521    No results found for: POCLITH, LITHIUM   No results found for: PHENYTOIN, PHENOBARB, VALPROATE, CBMZ   .res Assessment: Plan:    Rheta was seen today for follow-up, depression, anxiety, adhd and sleeping problem.  Diagnoses and all orders for this visit:  PTSD (post-traumatic stress disorder) -     fluvoxaMINE (LUVOX) 25 MG tablet; Take 1 tablet (25 mg total) by mouth 2 (two) times daily. Started May 14, 2019  Generalized anxiety disorder -     fluvoxaMINE (LUVOX) 25 MG tablet; Take 1 tablet (25 mg total) by mouth 2 (two) times daily. Started May 14, 2019  Panic disorder with agoraphobia -     fluvoxaMINE (LUVOX) 25 MG tablet; Take 1 tablet (25 mg total) by mouth 2 (two) times daily. 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  Need more time to reevaluate this and disc risk mania and mood cycling.  Discussed continued risk of hypomanic symptoms.  She has had symptoms consistent with hypomania in the past but there is now some question as to whether they were PTSD related rather than truly bipolar 2.  Her son has had treatment resistant depression with possible bipolar disorder and her daughter has been diagnosed with bipolar disorder but also a history of borderline personality disorder.  So there is a family history of the spectrum.  Realized lately she's depressed and started Luvox again for it and for the anti-inflammatory potential for her health.  Has seen some benefit.   Prior benefit at 25 mg daily.  Optiion increase but slightly bc med sensitive. AGREE increase fluvoxamine to 37.5 mg daily.   Disc lamotrigine and her skin dx.  Mood has been relatively ok off the lamotrigine.  Greater risk will occur in the fall with seasonal  depression by history.  It is now not 100% clear whether the lamotrigine was the cause of her skin reaction or if it was perhaps desipramine because she continued to have some skin issues even off the lamotrigine until desipramine was stopped.  Stress management for mood sx. Health problems worsen mood episodically.    Disc history of panic and  agoraphobia and Covid could strengthen the agoraphobia.  She doesn't go out much and prefers to stay in the house.  Need to work on maintaining independence by pushing outside activity.  We discussed the short-term risks associated with benzodiazepines including sedation and increased fall risk among others.  Discussed long-term side effect risk including dependence, potential withdrawal symptoms, and the potential eventual dose-related risk of dementia.  Keep this at a minimum given cognitive concerns.  She only uses it when triggered with anxiety usually medical.  She is not using it regularly.  Discussed potential benefits, risks, and side effects of stimulants with patient to include increased heart rate, palpitations, insomnia, increased anxiety, increased irritability, or decreased appetite.  Instructed patient to contact office if experiencing any significant tolerability issues. Option Ritalin 5 BID or 10 mg AM for cognitive and ADD reasons and disc risk of palpitations.  Disc weight concerns and recent Ozempic study to discuss with PCP  Continue therapy.  She is working well on things.  Greater than 50% of 30 mins of webex face to face time with patient was spent on counseling and coordination of care.   FU 2 mos  Lynder Parents, MD, DFAPA    Please see After Visit Summary for patient specific instructions.  Future Appointments  Date Time Provider [place]  06/30/2019  9:20 AM Ofilia Neas, PA-C [facility] None    No orders of the defined types were placed in this encounter.     -------------------------------

## 2019-06-26 ENCOUNTER — Ambulatory Visit: Payer: Medicare Other | Admitting: Physician Assistant

## 2019-06-26 DIAGNOSIS — D2271 Melanocytic nevi of right lower limb, including hip: Secondary | ICD-10-CM | POA: Diagnosis not present

## 2019-06-26 DIAGNOSIS — D2272 Melanocytic nevi of left lower limb, including hip: Secondary | ICD-10-CM | POA: Diagnosis not present

## 2019-06-26 DIAGNOSIS — Z85828 Personal history of other malignant neoplasm of skin: Secondary | ICD-10-CM | POA: Diagnosis not present

## 2019-06-26 DIAGNOSIS — L814 Other melanin hyperpigmentation: Secondary | ICD-10-CM | POA: Diagnosis not present

## 2019-06-26 DIAGNOSIS — D225 Melanocytic nevi of trunk: Secondary | ICD-10-CM | POA: Diagnosis not present

## 2019-06-26 DIAGNOSIS — D1801 Hemangioma of skin and subcutaneous tissue: Secondary | ICD-10-CM | POA: Diagnosis not present

## 2019-06-26 DIAGNOSIS — D2262 Melanocytic nevi of left upper limb, including shoulder: Secondary | ICD-10-CM | POA: Diagnosis not present

## 2019-06-26 DIAGNOSIS — D2261 Melanocytic nevi of right upper limb, including shoulder: Secondary | ICD-10-CM | POA: Diagnosis not present

## 2019-06-26 DIAGNOSIS — L821 Other seborrheic keratosis: Secondary | ICD-10-CM | POA: Diagnosis not present

## 2019-06-26 DIAGNOSIS — L570 Actinic keratosis: Secondary | ICD-10-CM | POA: Diagnosis not present

## 2019-06-27 DIAGNOSIS — J3089 Other allergic rhinitis: Secondary | ICD-10-CM | POA: Diagnosis not present

## 2019-06-27 DIAGNOSIS — J3081 Allergic rhinitis due to animal (cat) (dog) hair and dander: Secondary | ICD-10-CM | POA: Diagnosis not present

## 2019-06-27 DIAGNOSIS — H1045 Other chronic allergic conjunctivitis: Secondary | ICD-10-CM | POA: Diagnosis not present

## 2019-06-27 DIAGNOSIS — J301 Allergic rhinitis due to pollen: Secondary | ICD-10-CM | POA: Diagnosis not present

## 2019-06-30 ENCOUNTER — Encounter: Payer: Self-pay | Admitting: Physician Assistant

## 2019-06-30 ENCOUNTER — Ambulatory Visit (INDEPENDENT_AMBULATORY_CARE_PROVIDER_SITE_OTHER): Payer: Medicare Other | Admitting: Physician Assistant

## 2019-06-30 ENCOUNTER — Other Ambulatory Visit: Payer: Self-pay

## 2019-06-30 VITALS — BP 104/62 | HR 69 | Resp 14 | Ht 62.0 in | Wt 138.0 lb

## 2019-06-30 DIAGNOSIS — F3181 Bipolar II disorder: Secondary | ICD-10-CM

## 2019-06-30 DIAGNOSIS — G4709 Other insomnia: Secondary | ICD-10-CM

## 2019-06-30 DIAGNOSIS — M797 Fibromyalgia: Secondary | ICD-10-CM | POA: Diagnosis not present

## 2019-06-30 DIAGNOSIS — Z8659 Personal history of other mental and behavioral disorders: Secondary | ICD-10-CM | POA: Diagnosis not present

## 2019-06-30 DIAGNOSIS — D472 Monoclonal gammopathy: Secondary | ICD-10-CM

## 2019-06-30 DIAGNOSIS — M7061 Trochanteric bursitis, right hip: Secondary | ICD-10-CM | POA: Diagnosis not present

## 2019-06-30 DIAGNOSIS — M19041 Primary osteoarthritis, right hand: Secondary | ICD-10-CM

## 2019-06-30 DIAGNOSIS — Z8719 Personal history of other diseases of the digestive system: Secondary | ICD-10-CM

## 2019-06-30 DIAGNOSIS — M8589 Other specified disorders of bone density and structure, multiple sites: Secondary | ICD-10-CM

## 2019-06-30 DIAGNOSIS — M19042 Primary osteoarthritis, left hand: Secondary | ICD-10-CM

## 2019-06-30 DIAGNOSIS — H2513 Age-related nuclear cataract, bilateral: Secondary | ICD-10-CM | POA: Diagnosis not present

## 2019-06-30 DIAGNOSIS — H43813 Vitreous degeneration, bilateral: Secondary | ICD-10-CM | POA: Diagnosis not present

## 2019-06-30 DIAGNOSIS — M7062 Trochanteric bursitis, left hip: Secondary | ICD-10-CM

## 2019-06-30 DIAGNOSIS — M5030 Other cervical disc degeneration, unspecified cervical region: Secondary | ICD-10-CM

## 2019-06-30 DIAGNOSIS — H25013 Cortical age-related cataract, bilateral: Secondary | ICD-10-CM | POA: Diagnosis not present

## 2019-06-30 DIAGNOSIS — K811 Chronic cholecystitis: Secondary | ICD-10-CM

## 2019-06-30 DIAGNOSIS — R5383 Other fatigue: Secondary | ICD-10-CM

## 2019-06-30 DIAGNOSIS — M19072 Primary osteoarthritis, left ankle and foot: Secondary | ICD-10-CM

## 2019-06-30 DIAGNOSIS — M19071 Primary osteoarthritis, right ankle and foot: Secondary | ICD-10-CM | POA: Diagnosis not present

## 2019-06-30 DIAGNOSIS — K52832 Lymphocytic colitis: Secondary | ICD-10-CM | POA: Diagnosis not present

## 2019-06-30 DIAGNOSIS — H5213 Myopia, bilateral: Secondary | ICD-10-CM | POA: Diagnosis not present

## 2019-06-30 DIAGNOSIS — R202 Paresthesia of skin: Secondary | ICD-10-CM

## 2019-07-02 DIAGNOSIS — Z01419 Encounter for gynecological examination (general) (routine) without abnormal findings: Secondary | ICD-10-CM | POA: Diagnosis not present

## 2019-07-07 DIAGNOSIS — J301 Allergic rhinitis due to pollen: Secondary | ICD-10-CM | POA: Diagnosis not present

## 2019-07-07 DIAGNOSIS — J3089 Other allergic rhinitis: Secondary | ICD-10-CM | POA: Diagnosis not present

## 2019-07-07 DIAGNOSIS — J3081 Allergic rhinitis due to animal (cat) (dog) hair and dander: Secondary | ICD-10-CM | POA: Diagnosis not present

## 2019-07-17 DIAGNOSIS — F3181 Bipolar II disorder: Secondary | ICD-10-CM | POA: Diagnosis not present

## 2019-07-29 DIAGNOSIS — Z0100 Encounter for examination of eyes and vision without abnormal findings: Secondary | ICD-10-CM | POA: Diagnosis not present

## 2019-07-29 DIAGNOSIS — G43909 Migraine, unspecified, not intractable, without status migrainosus: Secondary | ICD-10-CM | POA: Diagnosis not present

## 2019-07-29 DIAGNOSIS — R413 Other amnesia: Secondary | ICD-10-CM | POA: Diagnosis not present

## 2019-07-29 DIAGNOSIS — F419 Anxiety disorder, unspecified: Secondary | ICD-10-CM | POA: Diagnosis not present

## 2019-07-30 DIAGNOSIS — J3089 Other allergic rhinitis: Secondary | ICD-10-CM | POA: Diagnosis not present

## 2019-07-30 DIAGNOSIS — J301 Allergic rhinitis due to pollen: Secondary | ICD-10-CM | POA: Diagnosis not present

## 2019-07-30 DIAGNOSIS — J3081 Allergic rhinitis due to animal (cat) (dog) hair and dander: Secondary | ICD-10-CM | POA: Diagnosis not present

## 2019-07-31 DIAGNOSIS — F3181 Bipolar II disorder: Secondary | ICD-10-CM | POA: Diagnosis not present

## 2019-08-01 DIAGNOSIS — J3089 Other allergic rhinitis: Secondary | ICD-10-CM | POA: Diagnosis not present

## 2019-08-01 DIAGNOSIS — J3081 Allergic rhinitis due to animal (cat) (dog) hair and dander: Secondary | ICD-10-CM | POA: Diagnosis not present

## 2019-08-01 DIAGNOSIS — J301 Allergic rhinitis due to pollen: Secondary | ICD-10-CM | POA: Diagnosis not present

## 2019-08-06 ENCOUNTER — Encounter: Payer: Self-pay | Admitting: Psychiatry

## 2019-08-06 ENCOUNTER — Telehealth (INDEPENDENT_AMBULATORY_CARE_PROVIDER_SITE_OTHER): Payer: Medicare Other | Admitting: Psychiatry

## 2019-08-06 DIAGNOSIS — F422 Mixed obsessional thoughts and acts: Secondary | ICD-10-CM | POA: Diagnosis not present

## 2019-08-06 DIAGNOSIS — F4001 Agoraphobia with panic disorder: Secondary | ICD-10-CM | POA: Diagnosis not present

## 2019-08-06 DIAGNOSIS — F4310 Post-traumatic stress disorder, unspecified: Secondary | ICD-10-CM | POA: Diagnosis not present

## 2019-08-06 DIAGNOSIS — F411 Generalized anxiety disorder: Secondary | ICD-10-CM

## 2019-08-06 DIAGNOSIS — F902 Attention-deficit hyperactivity disorder, combined type: Secondary | ICD-10-CM | POA: Diagnosis not present

## 2019-08-06 DIAGNOSIS — F331 Major depressive disorder, recurrent, moderate: Secondary | ICD-10-CM | POA: Diagnosis not present

## 2019-08-06 NOTE — Progress Notes (Addendum)
Danielle Bruce [id number] [birth year] 59 y.o.   Subjective:   Patient ID:  Danielle Bruce is a 59 y.o. (DOB 03-01-1960) female.  Chief Complaint:  Chief Complaint  Patient presents with  . Follow-up    med change  . Depression  . Anxiety    HPI Danielle Bruce presents for follow-up of multiple dxes.  visit in April and taken off lamotrigine DT possible skin reaction.  Had appt with [facility] dermatology and they assume that skin reaction with itching was related to lamotrigine. 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 Per oncology most likely drug is lamotrigine. And she weaned off of the medication to determine if the cutaneous lesions are related. Lesions seem less without lamotrigine.  At this point skin reaction is presumed related to lamotrigine.  No other disease sx. She is well aware that she has been on lamotrigine for many years with good results for depression control. There is a risk of relapse as she comes off of the medication and she is worried about that. Smart phone helps her cognitive  And other productivity.    seen October 2020.  Restarted low dosage ADD bc concerns over STM, concentration and productivity.  Ritalin 5 mg twice daily.  Also suggested she retry N-acetylcysteine for mild cognitive complaints.  Last seen April 07, 2019 the following was noted: Mostly good.  Anxiety is mostly better, but depression is a little worse seasonally.  Not able to go help her daughter.   Low energy but does have motivation.  Ritalin helps energy and focus but doesn't take it regularly bc irregular sleep cycle.   Ativan only when having medical issues.    Therapy working on trauma issues is very hard.  Struggling with the fact she doesn't have memory of large parts of her past.   No manic sx off mood stabilizers.  Covid isolation reduce stressors overall  and not prominently depressed.   No meds were changed.  June 25, 2019 appointment the following is noted: F real worried about Covid despite vaccination.   Started fluvoxamine 25 on May 14, 2019 from PCP mainly for inflammation but possibly thinking she might be depressed. Seen benefit for energy and sleep pattern and more appropriate feelings and less numb.  Now realizes she had been depressed for awhile but just numb with depression.  Anniversaries of deaths of friends including last BF Danielle Bruce a little over a year ago.   Thinks of him daily. His 57 yo D died of unknown cause recently.  Stress D moved to beach. Wonders about increasing the fluvoxamine  Danielle Bruce living in [place] and hasn't seen him in a year. Danielle Bruce on disability for depression. Patient reports more depression.  Plan: Realized lately she's depressed and started Luvox again for it and for the anti-inflammatory potential for her health.  Has seen some benefit.   Prior benefit at 25 mg daily.  Optiion increase but slightly bc med sensitive. AGREE increase fluvoxamine to 37.5 mg daily.  08/06/2019 appointment with the following noted: Increased fluvoxamine to 37.5 mg and it helped the depression. Could see a big difference and had been more depressed than she even realized. Outlook better and sleep pattern normalized.    More energy and motivation.   But also started having prolonged HA.  HA lasted a couple of weeks.   Assumed HA was DT increased fluvoxamine so reduced to 25  mg on 07/28/19.  07/29/19 Episode of confusion occurred also.  Went to doctor while confused.  They couldn't determine cause.    Resolved in 1 day. FM flare up since fall/winter. To beach with daughter 3 mos minimum to give her shots for IVF treatment.  Patient reports panic recently and recent difficulty with anxiety.    Occ flashbacks.   Patient denies difficulty with sleep initiation or maintenance. Denies appetite disturbance.  Patient reports that energy and  motivation have been good.  Patient denies any difficulty with concentration.  Patient denies any suicidal ideation.   Past psych meds: Lithium, carbamazepine, oxcarbazepine, topiramate, valproic acid, lamotrigine, gabapentin  Zyprexa, Seroquel, Latuda headaches, Geodon,  Risperidone, Stelazine,  Rexulti,  Wellbutrin with side effects,  sertraline irritability, duloxetine, fluvoxamine 25, fluoxetine, Stopped desipramine DT skin issues which now resolved.  It helped GI px.  buspirone side effects,   N-acetylcysteine, pramipexole,  methylphenidate,  Adderall,   Ambien with amnesia, trazodone, Ativan  Review of Systems:  Review of Systems  Constitutional: Positive for fatigue.  Gastrointestinal: Positive for abdominal distention and abdominal pain. Negative for vomiting.  Musculoskeletal: Positive for myalgias.  Neurological: Positive for dizziness and headaches. Negative for tremors and weakness.  Psychiatric/Behavioral: Positive for dysphoric mood. Negative for agitation, behavioral problems, confusion, hallucinations, self-injury and suicidal ideas. The patient is nervous/anxious.   Less HA with new meds.  Medications: I have reviewed the patient's current medications.  Current Outpatient Medications  Medication Sig Dispense Refill  . albuterol (ACCUNEB) 0.63 MG/3ML nebulizer solution Take 1 ampule by nebulization every 6 (six) hours as needed for wheezing.    Marland Kitchen albuterol (PROVENTIL HFA;VENTOLIN HFA) 108 (90 Base) MCG/ACT inhaler Inhale 2 puffs into the lungs every 6 (six) hours as needed for wheezing or shortness of breath.    . Ascorbic Acid (VITAMIN C) 100 MG tablet Take 100 mg by mouth daily.    Marland Kitchen azelastine (ASTELIN) 0.1 % nasal spray Place into both nostrils 2 (two) times daily. Use in each nostril as directed    . Azelastine HCl 0.15 % SOLN Place 2 sprays into both nostrils daily.    Marland Kitchen BEPREVE 1.5 % SOLN Place 1 drop into both eyes daily.     Marland Kitchen BREO ELLIPTA 200-25 MCG/INH AEPB 1  puff daily.    . cyclobenzaprine (FLEXERIL) 10 MG tablet Take 10 mg by mouth at bedtime as needed.    . diphenhydrAMINE (BENADRYL) 25 mg capsule Take 50 mg by mouth every 6 (six) hours as needed (HEADACHES).     Marland Kitchen EPINEPHrine 0.3 mg/0.3 mL IJ SOAJ injection as needed.    Eduard Roux (AIMOVIG) 70 MG/ML SOAJ Inject into the skin.    . fluorouracil (EFUDEX) 5 % cream APPLY EXTERNALLY TO THE AFFECTED AREA ON THE SKIN TWICE DAILY FOR 2 WEEKS    . fluticasone (FLONASE) 50 MCG/ACT nasal spray Place 2 sprays into both nostrils daily. 16 g 0  . fluvoxaMINE (LUVOX) 25 MG tablet Take 1 tablet (25 mg total) by mouth 2 (two) times daily. Started May 14, 2019 (Patient taking differently: Take 37.5 mg by mouth 2 (two) times daily. Started May 14, 2019 ) 180 tablet 0  . hydrOXYzine (ATARAX/VISTARIL) 25 MG tablet TK 1 TO 2 TS PO TID PRF MIGRAINE  4  . L-methylfolate Calcium 15 MG TABS TAKE 1 TABLET BY MOUTH DAILY 30 tablet 5  . levocetirizine (XYZAL) 5 MG tablet Take 5 mg by mouth every evening.  5  . LORazepam (ATIVAN) 1  MG tablet Take 1 tablet (1 mg total) by mouth every 8 (eight) hours as needed for anxiety. (takes 2 mg when having a medical procedure.) 30 tablet 1  . LORazepam (ATIVAN) 2 MG/ML concentrated solution Take 0.5 mLs (1 mg total) by mouth every 8 (eight) hours as needed for anxiety (severe panic). 30 mL 1  . MELATONIN PO Take 1.5 mg by mouth at bedtime.    . methylphenidate (RITALIN) 5 MG tablet Take 1 tablet (5 mg total) by mouth 2 (two) times daily with breakfast and lunch. 60 tablet 0  . montelukast (SINGULAIR) 10 MG tablet Take 10 mg by mouth at bedtime.    Marland Kitchen oxyCODONE-acetaminophen (PERCOCET/ROXICET) 5-325 MG per tablet Take 2 tablets by mouth daily as needed for moderate pain or severe pain (mighraine.).     Marland Kitchen polyethylene glycol (MIRALAX / GLYCOLAX) packet Take 17 g by mouth daily as needed for mild constipation.     Marland Kitchen PRAVASTATIN SODIUM PO Take 40 mg by mouth.     . predniSONE  (DELTASONE) 10 MG tablet     . predniSONE (DELTASONE) 5 MG tablet Take 2.5-5 mg by mouth daily as needed.    Marland Kitchen PROLIA 60 MG/ML SOSY injection BRING TO THE OFFICE FOR INJECTION ON 02/15/2018 AS DIRECTED ONCE EVERY 6 MONTHS    . promethazine (PHENERGAN) 25 MG tablet Take 25 mg by mouth every 6 (six) hours as needed for nausea.     . risperiDONE (RISPERDAL) 0.25 MG tablet Take 0.25 mg by mouth daily as needed. As needed for an anxiety attack     . rizatriptan (MAXALT) 10 MG tablet Take 10 mg by mouth as needed.   4  . traZODone (DESYREL) 50 MG tablet TAKE 1 TABLET(50 MG) BY MOUTH AT BEDTIME 90 tablet 0  . Ubrogepant (UBRELVY) 50 MG TABS Take 50-100 mg by mouth daily as needed.    Marland Kitchen VASCEPA 1 g CAPS TK 2 CS PO BID WC  3  . VITAMIN D PO Take by mouth. Take 5051m daily    . VOLTAREN 1 % GEL Apply 2 g topically 4 (four) times daily as needed (JOINT PAIN).   3   No current facility-administered medications for this visit.    Medication Side Effects: None  Allergies:  Allergies  Allergen Reactions  . Sulfa Antibiotics Nausea And Vomiting    Causes pt to vomit blood  . Gluten Meal Other (See Comments)    Sensitivity.   . Lactose Intolerance (Gi) Nausea And Vomiting  . Flagyl [Metronidazole Hcl] Nausea And Vomiting  . Morphine And Related Other (See Comments)    Reaction: Depression, emotional    Past Medical History:  Diagnosis Date  . Anemia   . Anxiety   . Arthritis    osteoarthritis  . ASCUS (atypical squamous cells of undetermined significance) on Pap smear 05/06/05   NEG HIGH RISK HPV--C&B BIOPSY BENIGN 12/2005  . Asthma   . Bipolar 2 disorder ([facility]   . Cancer ([facility]    skin cancer - basal cell  . Colon polyps   . Complication of anesthesia    anxious afterwards, will get headaches   . Constipation   . Depression   . Fibromyalgia 10/2013  . GERD (gastroesophageal reflux disease)   . 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 Heart murmur    never had any problems  . Hemorrhoids   . High  cholesterol   . 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   cytology negative  .  IBS (irritable bowel syndrome)   . Insomnia   . Lymphocytic colitis   . Lymphoma ([facility])   . 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 Migraines   . Osteoarthritis   . Osteopenia   . Peripheral neuropathy   . PTSD (post-traumatic stress disorder)     Family History  Problem Relation Age of Onset  . Diabetes Father   . Hypertension Father   . Hyperlipidemia Father   . Heart disease Maternal Grandfather   . Heart disease Paternal Grandmother   . Heart disease Paternal Grandfather   . Depression Son   . Depression Son   . Anxiety disorder Son   . Depression Daughter   . Anxiety disorder Daughter   . Asthma Daughter     Social History   Socioeconomic History  . Marital status: Divorced    Spouse name: Not on file  . Number of children: 3  . Years of education: Not on file  . Highest education level: Not on file  Occupational History  . Not on file  Tobacco Use  . Smoking status: Former Smoker    Types: Cigarettes    Quit date: 05/13/2014    Years since quitting: 5.2  . Smokeless tobacco: Never Used  . Tobacco comment: social   Substance and Sexual Activity  . Alcohol use: Yes    Alcohol/week: 0.0 standard drinks    Comment: rare  . Drug use: Never  . Sexual activity: Not Currently    Comment: -1st intercourse 59 yo-More than 5 partners  Other Topics Concern  . Not on file  Social History Narrative  . Not on file   Social Determinants of Health   Financial Resource Strain:   . Difficulty of Paying Living Expenses:   Food Insecurity:   . Worried About Charity fundraiser in the Last Year:   . Arboriculturist in the Last Year:   Transportation Needs:   . Film/video editor (Medical):   Marland Kitchen Lack of Transportation (Non-Medical):   Physical Activity:   . Days of Exercise per Week:   . Minutes of Exercise  per Session:   Stress:   . Feeling of Stress :   Social Connections:   . Frequency of Communication with Friends and Family:   . Frequency of Social Gatherings with Friends and Family:   . Attends Religious Services:   . Active Member of Clubs or Organizations:   . Attends Archivist Meetings:   Marland Kitchen Marital Status:   Intimate Partner Violence:   . Fear of Current or Ex-Partner:   . Emotionally Abused:   Marland Kitchen Physically Abused:   . Sexually Abused:     Past Medical History, Surgical history, Social history, and Family history were reviewed and updated as appropriate.   Please see review of systems for further details on the patient's review from today.   Objective:   Physical Exam:  LMP 12/28/2010   Physical Exam Constitutional:      General: She is not in acute distress. Musculoskeletal:        General: No deformity.  Neurological:     Mental Status: She is alert and oriented to person, place, and time.     Cranial Nerves: No dysarthria.     Coordination: Coordination normal.  Psychiatric:        Attention and Perception: Attention and perception normal. She does not perceive  auditory or visual hallucinations.        Mood and Affect: Mood is anxious. Mood is not depressed. Affect is not labile, blunt, angry or inappropriate.        Speech: Speech normal.        Behavior: Behavior normal. Behavior is cooperative.        Thought Content: Thought content normal. Thought content is not paranoid or delusional. Thought content does not include homicidal or suicidal ideation. Thought content does not include homicidal or suicidal plan.        Cognition and Memory: Cognition and memory normal.        Judgment: Judgment normal.     Comments: Insight intact Depression resolved with fluvoxamine 25 mg Mood worsen within health problems worsen.     Lab Review:     Component Value Date/Time   NA 142 08/01/2016 1713   NA 144 07/12/2015 1521   K 4.7 08/01/2016 1713   K 3.8  07/12/2015 1521   CL 107 08/01/2016 1713   CO2 23 08/01/2016 1713   CO2 27 07/12/2015 1521   GLUCOSE 95 08/01/2016 1713   GLUCOSE 82 07/12/2015 1521   BUN 14 08/01/2016 1713   BUN 10.3 07/12/2015 1521   CREATININE 0.87 08/01/2016 1713   CREATININE 0.8 07/12/2015 1521   CALCIUM 10.0 08/01/2016 1713   CALCIUM 9.9 07/12/2015 1521   PROT 7.4 08/01/2016 1713   PROT 7.4 07/12/2015 1521   ALBUMIN 4.8 08/01/2016 1713   ALBUMIN 4.4 07/12/2015 1521   AST 23 08/01/2016 1713   AST 22 07/12/2015 1521   ALT 26 08/01/2016 1713   ALT 31 07/12/2015 1521   ALKPHOS 55 08/01/2016 1713   ALKPHOS 54 07/12/2015 1521   BILITOT 0.7 08/01/2016 1713   BILITOT 0.40 07/12/2015 1521   GFRNONAA 76 08/01/2016 1713   GFRAA 88 08/01/2016 1713       Component Value Date/Time   WBC 5.9 08/01/2016 1713   RBC 4.71 08/01/2016 1713   HGB 14.6 08/01/2016 1713   HGB 14.1 07/12/2015 1521   HCT 43.1 08/01/2016 1713   HCT 41.5 07/12/2015 1521   PLT 254 08/01/2016 1713   PLT 248 07/12/2015 1521   MCV 91.5 08/01/2016 1713   MCV 92.2 07/12/2015 1521   MCH 31.0 08/01/2016 1713   MCHC 33.9 08/01/2016 1713   RDW 13.2 08/01/2016 1713   RDW 13.3 07/12/2015 1521   LYMPHSABS 2,006 08/01/2016 1713   LYMPHSABS 1.8 07/12/2015 1521   MONOABS 354 08/01/2016 1713   MONOABS 0.7 07/12/2015 1521   EOSABS 118 08/01/2016 1713   EOSABS 0.1 07/12/2015 1521   BASOSABS 0 08/01/2016 1713   BASOSABS 0.0 07/12/2015 1521    No results found for: POCLITH, LITHIUM   No results found for: PHENYTOIN, PHENOBARB, VALPROATE, CBMZ   .res Assessment: Plan:    Xian was seen today for follow-up, depression and anxiety.  Diagnoses and all orders for this visit:  Major depressive disorder, recurrent episode, moderate (HCC)  PTSD (post-traumatic stress disorder)  Generalized anxiety disorder  Panic disorder with agoraphobia  Attention deficit hyperactivity disorder (ADHD), combined type  Mixed obsessional thoughts and  acts  Rediscussed dx issues bc more prominent PTSD obvious has called into doubt the bipolar dix bc she's remained mood stable off the lamotrigine.  Need more time to reevaluate this and disc risk mania and mood cycling.  Discussed continued risk of hypomanic symptoms.  She has had symptoms consistent with hypomania in the past but there  is now some question as to whether they were PTSD related rather than truly bipolar 2.  Her son has had treatment resistant depression with possible bipolar disorder and her daughter has been diagnosed with bipolar disorder but also a history of borderline personality disorder.  So there is a family history of the spectrum.  started Luvox again for it and for the anti-inflammatory potential for her health.  Has seen some benefit.   Prior benefit at 25 mg daily.  Optiion increase but slightly bc med sensitive. AGREE increase fluvoxamine to 37.5 mg daily once HA is controlled for several weekcs. Clear benefit from fluvoxamine for mood quite dramatically. No manic sx.  Stress management for mood sx. Health problems worsen mood episodically.    We discussed the short-term risks associated with benzodiazepines including sedation and increased fall risk among others.  Discussed long-term side effect risk including dependence, potential withdrawal symptoms, and the potential eventual dose-related risk of dementia.  Keep this at a minimum given cognitive concerns.  She only uses it when triggered with anxiety usually medical.  She is not using it regularly.  Discussed potential benefits, risks, and side effects of stimulants with patient to include increased heart rate, palpitations, insomnia, increased anxiety, increased irritability, or decreased appetite.  Instructed patient to contact office if experiencing any significant tolerability issues. Option Ritalin 5 BID or 10 mg AM for cognitive and ADD reasons and disc risk of palpitations.  Continue therapy.  She is working  well on things.  Greater than 50% of 30 mins of webex face to face time with patient was spent on counseling and coordination of care.   FU 3 mos  Lynder Parents, MD, DFAPA    Please see After Visit Summary for patient specific instructions.  Future Appointments  Date Time Provider [place]  09/04/2019 11:45 AM Desenglau, Tommy Rainwater, PT [facility]  12/30/2019  1:00 PM Ofilia Neas, PA-C [facility] None    No orders of the defined types were placed in this encounter.     -------------------------------

## 2019-08-08 DIAGNOSIS — J3089 Other allergic rhinitis: Secondary | ICD-10-CM | POA: Diagnosis not present

## 2019-08-08 DIAGNOSIS — J3081 Allergic rhinitis due to animal (cat) (dog) hair and dander: Secondary | ICD-10-CM | POA: Diagnosis not present

## 2019-08-08 DIAGNOSIS — J301 Allergic rhinitis due to pollen: Secondary | ICD-10-CM | POA: Diagnosis not present

## 2019-08-24 ENCOUNTER — Other Ambulatory Visit: Payer: Self-pay | Admitting: Psychiatry

## 2019-09-04 ENCOUNTER — Ambulatory Visit: Payer: Medicare Other | Admitting: Physical Therapy

## 2019-09-18 DIAGNOSIS — M79671 Pain in right foot: Secondary | ICD-10-CM | POA: Diagnosis not present

## 2019-09-18 DIAGNOSIS — M79672 Pain in left foot: Secondary | ICD-10-CM | POA: Diagnosis not present

## 2019-09-18 DIAGNOSIS — M7741 Metatarsalgia, right foot: Secondary | ICD-10-CM | POA: Diagnosis not present

## 2019-09-18 DIAGNOSIS — M7742 Metatarsalgia, left foot: Secondary | ICD-10-CM | POA: Diagnosis not present

## 2019-09-21 ENCOUNTER — Other Ambulatory Visit: Payer: Self-pay | Admitting: Psychiatry

## 2019-09-26 DIAGNOSIS — M7741 Metatarsalgia, right foot: Secondary | ICD-10-CM | POA: Diagnosis not present

## 2019-09-26 DIAGNOSIS — M79671 Pain in right foot: Secondary | ICD-10-CM | POA: Diagnosis not present

## 2019-09-26 DIAGNOSIS — M797 Fibromyalgia: Secondary | ICD-10-CM | POA: Diagnosis not present

## 2019-09-26 DIAGNOSIS — M79672 Pain in left foot: Secondary | ICD-10-CM | POA: Diagnosis not present

## 2019-09-26 DIAGNOSIS — M7742 Metatarsalgia, left foot: Secondary | ICD-10-CM | POA: Diagnosis not present

## 2019-09-26 DIAGNOSIS — R262 Difficulty in walking, not elsewhere classified: Secondary | ICD-10-CM | POA: Diagnosis not present

## 2019-09-26 DIAGNOSIS — M6289 Other specified disorders of muscle: Secondary | ICD-10-CM | POA: Diagnosis not present

## 2019-09-29 DIAGNOSIS — M6289 Other specified disorders of muscle: Secondary | ICD-10-CM | POA: Diagnosis not present

## 2019-09-29 DIAGNOSIS — M79671 Pain in right foot: Secondary | ICD-10-CM | POA: Diagnosis not present

## 2019-09-29 DIAGNOSIS — M7742 Metatarsalgia, left foot: Secondary | ICD-10-CM | POA: Diagnosis not present

## 2019-09-29 DIAGNOSIS — R262 Difficulty in walking, not elsewhere classified: Secondary | ICD-10-CM | POA: Diagnosis not present

## 2019-09-29 DIAGNOSIS — M797 Fibromyalgia: Secondary | ICD-10-CM | POA: Diagnosis not present

## 2019-09-29 DIAGNOSIS — M7741 Metatarsalgia, right foot: Secondary | ICD-10-CM | POA: Diagnosis not present

## 2019-09-29 DIAGNOSIS — M79672 Pain in left foot: Secondary | ICD-10-CM | POA: Diagnosis not present

## 2019-10-17 DIAGNOSIS — Z7189 Other specified counseling: Secondary | ICD-10-CM | POA: Diagnosis not present

## 2019-10-17 DIAGNOSIS — J309 Allergic rhinitis, unspecified: Secondary | ICD-10-CM | POA: Diagnosis not present

## 2019-10-17 DIAGNOSIS — J069 Acute upper respiratory infection, unspecified: Secondary | ICD-10-CM | POA: Diagnosis not present

## 2019-10-17 DIAGNOSIS — E7800 Pure hypercholesterolemia, unspecified: Secondary | ICD-10-CM | POA: Diagnosis not present

## 2019-10-17 DIAGNOSIS — R0789 Other chest pain: Secondary | ICD-10-CM | POA: Diagnosis not present

## 2019-10-17 DIAGNOSIS — F411 Generalized anxiety disorder: Secondary | ICD-10-CM | POA: Diagnosis not present

## 2019-10-17 DIAGNOSIS — G43909 Migraine, unspecified, not intractable, without status migrainosus: Secondary | ICD-10-CM | POA: Diagnosis not present

## 2019-10-17 DIAGNOSIS — M797 Fibromyalgia: Secondary | ICD-10-CM | POA: Diagnosis not present

## 2019-10-19 DIAGNOSIS — R05 Cough: Secondary | ICD-10-CM | POA: Diagnosis not present

## 2019-10-19 DIAGNOSIS — J069 Acute upper respiratory infection, unspecified: Secondary | ICD-10-CM | POA: Diagnosis not present

## 2019-10-19 DIAGNOSIS — Z20822 Contact with and (suspected) exposure to covid-19: Secondary | ICD-10-CM | POA: Diagnosis not present

## 2019-10-19 DIAGNOSIS — R079 Chest pain, unspecified: Secondary | ICD-10-CM | POA: Diagnosis not present

## 2019-10-19 DIAGNOSIS — R0789 Other chest pain: Secondary | ICD-10-CM | POA: Diagnosis not present

## 2019-10-20 ENCOUNTER — Other Ambulatory Visit: Payer: Self-pay | Admitting: Psychiatry

## 2019-10-20 DIAGNOSIS — F4001 Agoraphobia with panic disorder: Secondary | ICD-10-CM

## 2019-10-20 DIAGNOSIS — F4310 Post-traumatic stress disorder, unspecified: Secondary | ICD-10-CM

## 2019-10-20 DIAGNOSIS — F3181 Bipolar II disorder: Secondary | ICD-10-CM | POA: Diagnosis not present

## 2019-10-20 DIAGNOSIS — F411 Generalized anxiety disorder: Secondary | ICD-10-CM

## 2019-10-21 NOTE — Telephone Encounter (Signed)
Dose change in June?

## 2019-10-22 DIAGNOSIS — Z20822 Contact with and (suspected) exposure to covid-19: Secondary | ICD-10-CM | POA: Diagnosis not present

## 2019-10-22 DIAGNOSIS — R05 Cough: Secondary | ICD-10-CM | POA: Diagnosis not present

## 2019-10-22 DIAGNOSIS — Z6825 Body mass index (BMI) 25.0-25.9, adult: Secondary | ICD-10-CM | POA: Diagnosis not present

## 2019-11-10 DIAGNOSIS — J3081 Allergic rhinitis due to animal (cat) (dog) hair and dander: Secondary | ICD-10-CM | POA: Diagnosis not present

## 2019-11-10 DIAGNOSIS — J3089 Other allergic rhinitis: Secondary | ICD-10-CM | POA: Diagnosis not present

## 2019-11-10 DIAGNOSIS — J301 Allergic rhinitis due to pollen: Secondary | ICD-10-CM | POA: Diagnosis not present

## 2019-11-11 ENCOUNTER — Telehealth: Payer: Self-pay | Admitting: Psychiatry

## 2019-11-11 ENCOUNTER — Other Ambulatory Visit: Payer: Self-pay | Admitting: Psychiatry

## 2019-11-11 DIAGNOSIS — F902 Attention-deficit hyperactivity disorder, combined type: Secondary | ICD-10-CM

## 2019-11-11 MED ORDER — METHYLPHENIDATE HCL 5 MG PO TABS: 5.0000 mg | ORAL_TABLET | Freq: Two times a day (BID) | ORAL | 0 refills | Status: DC

## 2019-11-11 NOTE — Telephone Encounter (Signed)
Last refill 04/07/19, okay to pend? Doesn't take often?

## 2019-11-11 NOTE — Telephone Encounter (Signed)
Danielle Bruce called to request refill of her Ritalin.  appt 11/4.  Send to [facility] on [address]

## 2019-11-11 NOTE — Telephone Encounter (Signed)
Rx sent 

## 2019-11-13 DIAGNOSIS — J301 Allergic rhinitis due to pollen: Secondary | ICD-10-CM | POA: Diagnosis not present

## 2019-11-13 DIAGNOSIS — J3089 Other allergic rhinitis: Secondary | ICD-10-CM | POA: Diagnosis not present

## 2019-11-13 DIAGNOSIS — J3081 Allergic rhinitis due to animal (cat) (dog) hair and dander: Secondary | ICD-10-CM | POA: Diagnosis not present

## 2019-11-17 DIAGNOSIS — J3089 Other allergic rhinitis: Secondary | ICD-10-CM | POA: Diagnosis not present

## 2019-11-17 DIAGNOSIS — F3181 Bipolar II disorder: Secondary | ICD-10-CM | POA: Diagnosis not present

## 2019-11-17 DIAGNOSIS — J3081 Allergic rhinitis due to animal (cat) (dog) hair and dander: Secondary | ICD-10-CM | POA: Diagnosis not present

## 2019-11-17 DIAGNOSIS — J301 Allergic rhinitis due to pollen: Secondary | ICD-10-CM | POA: Diagnosis not present

## 2019-11-18 DIAGNOSIS — R0602 Shortness of breath: Secondary | ICD-10-CM | POA: Diagnosis not present

## 2019-11-21 ENCOUNTER — Other Ambulatory Visit: Payer: Self-pay | Admitting: Psychiatry

## 2019-11-24 DIAGNOSIS — G5600 Carpal tunnel syndrome, unspecified upper limb: Secondary | ICD-10-CM | POA: Diagnosis not present

## 2019-11-24 DIAGNOSIS — Z8616 Personal history of COVID-19: Secondary | ICD-10-CM | POA: Diagnosis not present

## 2019-11-24 DIAGNOSIS — Z7189 Other specified counseling: Secondary | ICD-10-CM | POA: Diagnosis not present

## 2019-11-24 DIAGNOSIS — J019 Acute sinusitis, unspecified: Secondary | ICD-10-CM | POA: Diagnosis not present

## 2019-11-24 DIAGNOSIS — M7501 Adhesive capsulitis of right shoulder: Secondary | ICD-10-CM | POA: Diagnosis not present

## 2019-12-02 DIAGNOSIS — J3089 Other allergic rhinitis: Secondary | ICD-10-CM | POA: Diagnosis not present

## 2019-12-02 DIAGNOSIS — J3081 Allergic rhinitis due to animal (cat) (dog) hair and dander: Secondary | ICD-10-CM | POA: Diagnosis not present

## 2019-12-02 DIAGNOSIS — J301 Allergic rhinitis due to pollen: Secondary | ICD-10-CM | POA: Diagnosis not present

## 2019-12-05 DIAGNOSIS — J301 Allergic rhinitis due to pollen: Secondary | ICD-10-CM | POA: Diagnosis not present

## 2019-12-05 DIAGNOSIS — J3089 Other allergic rhinitis: Secondary | ICD-10-CM | POA: Diagnosis not present

## 2019-12-05 DIAGNOSIS — J3081 Allergic rhinitis due to animal (cat) (dog) hair and dander: Secondary | ICD-10-CM | POA: Diagnosis not present

## 2019-12-08 DIAGNOSIS — H1045 Other chronic allergic conjunctivitis: Secondary | ICD-10-CM | POA: Diagnosis not present

## 2019-12-08 DIAGNOSIS — J3089 Other allergic rhinitis: Secondary | ICD-10-CM | POA: Diagnosis not present

## 2019-12-08 DIAGNOSIS — J3081 Allergic rhinitis due to animal (cat) (dog) hair and dander: Secondary | ICD-10-CM | POA: Diagnosis not present

## 2019-12-08 DIAGNOSIS — J301 Allergic rhinitis due to pollen: Secondary | ICD-10-CM | POA: Diagnosis not present

## 2019-12-16 NOTE — Progress Notes (Signed)
Office Visit Note  Patient: Danielle Bruce             Date of Birth: 12-29-1960           MRN: [id number]             PCP: Mayra Neer, MD Referring: Mayra Neer, MD Visit Date: 12/30/2019 Occupation: @GUAROCC @  Subjective:  Generalized pain   History of Present Illness: Danielle Bruce is a 59 y.o. female with history of fibromyalgia, osteoarthritis, and DDD.  Patient reports that she has been having more frequent and severe fibromyalgia flares over the past several months.  She states that she [place] at which time she increased her level of activity while helping to take care of her twin 40-year-old grandsons.  She returned home in September and has continued to have increased myalgias and muscle tenderness.  She states that her skin feels sensitive to touch at times.  She has trapezius muscle tension and muscle tenderness bilaterally, pain in both elbows, trochanteric bursitis of both hips, and generalized muscle tenderness.  She has also been experiencing increased fatigue secondary to insomnia.  She has noticed mild worsening of her depression recently but continues to see her therapist on a regular basis.  She attributes the increase in her depression to increased pain.  She denies any suicidal ideations.  She was recently evaluated by her PCP who started her on Lyrica cough 50 mg 1 tablet 3 times daily.  She has only been taking Lyrica as needed for the past 1 week but has not noticed any improvement in her symptoms.  She has noticed swollen blurry vision while taking Lyrica but denies any other side effects.  She has been using a heating pad as well as Voltaren gel topically as needed for pain relief.  She has also been increasing her exercise regimen by walking on a regular basis. She would like to try to start eating better and exercising more to hopefully improve her generalized pain.    Activities of Daily Living:    Patient reports morning stiffness for 3 minutes.   Patient Reports nocturnal pain.  Difficulty dressing/grooming: Denies Difficulty climbing stairs: Reports Difficulty getting out of chair: Reports Difficulty using hands for taps, buttons, cutlery, and/or writing: Reports  Review of Systems  Constitutional: Positive for fatigue.  HENT: Positive for mouth dryness. Negative for mouth sores and nose dryness.   Eyes: Negative for pain, visual disturbance and dryness.  Respiratory: Negative for cough, hemoptysis and difficulty breathing.   Cardiovascular: Positive for swelling in legs/feet. Negative for chest pain, palpitations and hypertension.  Gastrointestinal: Positive for constipation and diarrhea. Negative for blood in stool.  Endocrine: Positive for heat intolerance and excessive thirst.  Genitourinary: Negative for difficulty urinating and painful urination.  Musculoskeletal: Positive for arthralgias, joint pain, joint swelling, muscle weakness, morning stiffness and muscle tenderness. Negative for myalgias and myalgias.  Skin: Positive for rash. Negative for color change, pallor, hair loss, nodules/bumps, skin tightness, ulcers and sensitivity to sunlight.  Neurological: Negative for dizziness and headaches.  Hematological: Positive for bruising/bleeding tendency. Negative for swollen glands.  Psychiatric/Behavioral: Positive for sleep disturbance. Negative for depressed mood. The patient is not nervous/anxious.     PMFS History:  Patient Active Problem List   Diagnosis Date Noted  . GAD (generalized anxiety disorder) 12/26/2017  . OCD (obsessive compulsive disorder) 12/26/2017  . PTSD (post-traumatic stress disorder) 12/26/2017  . DDD (  degenerative disc disease), cervical 07/14/2016  . Primary osteoarthritis of both feet 07/14/2016  . Primary osteoarthritis of both hands 07/14/2016  . Other fatigue 07/14/2016  . History of IBS 07/14/2016  . Osteopenia of multiple sites  07/14/2016  . Fibromyalgia 01/13/2016  . MGUS (monoclonal gammopathy of unknown significance) 12/23/2013  . Chronic cholecystitis without calculus 10/18/2012  . Abdominal pain, unspecified site 10/18/2012  . Nausea alone 10/18/2012  . Unspecified constipation 10/18/2012  . Depression 10/18/2012  . Anxiety   . ASCUS (atypical squamous cells of undetermined significance) on Pap smear   . IUD   . Hemorrhoids 11/29/2010  . Abdominal pain, left upper quadrant 11/29/2010    Past Medical History:  Diagnosis Date  . Anemia   . Anxiety   . Arthritis    osteoarthritis  . ASCUS (atypical squamous cells of undetermined significance) on Pap smear 05/06/05   NEG HIGH RISK HPV--C&B BIOPSY BENIGN 12/2005  . Asthma   . Bipolar 2 disorder ([place])   . Cancer ([place])    skin cancer - basal cell  . Colon polyps   . Complication of anesthesia    anxious afterwards, will get headaches   . Constipation   . Depression   . Fibromyalgia 10/2013  . GERD (gastroesophageal reflux disease)   . 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 Heart murmur    never had any problems  . Hemorrhoids   . High cholesterol   . 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   cytology negative  . IBS (irritable bowel syndrome)   . Insomnia   . Lymphocytic colitis   . Lymphoma ([place])   . 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 Migraines   . Osteoarthritis   . Osteopenia   . Peripheral neuropathy   . PTSD (post-traumatic stress disorder)     Family History  Problem Relation Age of Onset  . Diabetes Father   . Hypertension Father   . Hyperlipidemia Father   . Heart disease Maternal Grandfather   . Heart disease Paternal Grandmother   . Heart disease Paternal Grandfather   . Depression Son   . Depression Son   . Anxiety disorder Son   . Depression Daughter   . Anxiety disorder Daughter   . Asthma Daughter    Past Surgical History:  Procedure Laterality  Date  . BONE MARROW BIOPSY Left 12/18/2013   Plasma cell dyscrasia 8% population of plasma cells  . BREAST BIOPSY Right    benign stereo  . CESAREAN SECTION  [phone]  . CHOLECYSTECTOMY N/A 10/16/2012   Procedure: LAPAROSCOPIC CHOLECYSTECTOMY;  Surgeon: Joyice Faster. Cornett, MD;  Location: [facility];  Service: General;  Laterality: N/A;  . COLONOSCOPY     numerous times  . DILATION AND CURETTAGE OF UTERUS    . ESOPHAGOGASTRODUODENOSCOPY    . [place]   x3  . IUD REMOVAL  02/2015   Mirena  . LAPAROSCOPIC LYSIS OF ADHESIONS N/A 10/16/2012   Procedure: LAPAROSCOPIC LYSIS OF ADHESIONS;  Surgeon: Joyice Faster. Cornett, MD;  Location: [facility];  Service: General;  Laterality: N/A;  . LAPAROSCOPY N/A 10/16/2012   Procedure: LAPAROSCOPY DIAGNOSTIC;  Surgeon: Joyice Faster. Cornett, MD;  Location: [facility];  Service: General;  Laterality: N/A;  . PELVIC LAPAROSCOPY    . RADIOLOGY WITH ANESTHESIA N/A 12/16/2015   Procedure: MRI OF BRAIN WITH AND WITHOUT CONTRAST;  Surgeon: Medication Radiologist, MD;  Location: [facility];  Service: Radiology;  Laterality: N/A;  . SHOULDER SURGERY  2007/2008  . SPINE SURGERY  2010   cervical   Social History   Social History Narrative  . Not on file   Immunization History  Administered Date(s) Administered  . Influenza-Unspecified 11/20/2013  . PFIZER SARS-COV-2 Vaccination 05/15/2019, 06/09/2019     Objective: Vital Signs: BP 126/74 (BP Location: Left Arm, Patient Position: Sitting, Cuff Size: Normal)   Pulse 79   Resp 14   Ht 5' 2"  (1.575 m)   Wt 140 lb (63.5 kg)   LMP 12/28/2010   BMI 25.61 kg/m    Physical Exam Vitals and nursing note reviewed.  Constitutional:      Appearance: She is well-developed.  HENT:     Head: Normocephalic and atraumatic.  Eyes:     Conjunctiva/sclera: Conjunctivae normal.  Pulmonary:     Effort: Pulmonary effort is normal.  Abdominal:     Palpations: Abdomen is soft.  Musculoskeletal:     Cervical back: Normal  range of motion.  Skin:    General: Skin is warm and dry.     Capillary Refill: Capillary refill takes less than 2 seconds.  Neurological:     Mental Status: She is alert and oriented to person, place, and time.  Psychiatric:        Behavior: Behavior normal.      Musculoskeletal Exam: Generalized hyperalgesia and positive tender points on exam.  C-spine, thoracic spine, and lumbar spine good ROM.  Shoulder joints, elbow joints, wrist joints, MCPs, PIPs, and DIPs good ROM with no synovitis. Tenderness along both elbow joints. Complete fist formation bilaterally.  Tenderness of the right 4th and 5th MCP joint.  Hip joints, knee joints, and ankle joints good ROM with no discomfort.  Tenderness over bilateral trochanteric bursa.  No warmth or effusion of knee joints.  No tenderness or swelling of ankle joints.  No tenderness of MTP joints.   CDAI Exam: CDAI Score: -- Patient Global: --; Provider Global: -- Swollen: --; Tender: -- Joint Exam 12/30/2019   No joint exam has been documented for this visit   There is currently no information documented on the homunculus. Go to the Rheumatology activity and complete the homunculus joint exam.  Investigation: No additional findings.  Imaging: MM 3D SCREEN BREAST BILATERAL  Result Date: 12/25/2019 CLINICAL DATA:  Screening. EXAM: DIGITAL SCREENING BILATERAL MAMMOGRAM WITH TOMO AND CAD COMPARISON:  Previous exam(s). ACR Breast Density Category b: There are scattered areas of fibroglandular density. FINDINGS: There are no findings suspicious for malignancy. Images were processed with CAD. IMPRESSION: No mammographic evidence of malignancy. A result letter of this screening mammogram will be mailed directly to the patient. RECOMMENDATION: Screening mammogram in one year. (Code:SM-B-01Y) BI-RADS CATEGORY  1: Negative. Electronically Signed   By: Abelardo Diesel M.D.   On: 12/25/2019 14:22    Recent Labs: Lab Results  Component Value Date   WBC 5.9  08/01/2016   HGB 14.6 08/01/2016   PLT 254 08/01/2016   NA 142 08/01/2016   K 4.7 08/01/2016   CL 107 08/01/2016   CO2 23 08/01/2016   GLUCOSE 95 08/01/2016   BUN 14 08/01/2016   CREATININE 0.87 08/01/2016   BILITOT 0.7 08/01/2016   ALKPHOS 55 08/01/2016   AST 23 08/01/2016   ALT 26 08/01/2016   PROT 7.4 08/01/2016   ALBUMIN 4.8 08/01/2016   CALCIUM 10.0 08/01/2016   GFRAA 88 08/01/2016    Speciality Comments: No  specialty comments available.  Procedures:  No procedures performed Allergies: Sulfa antibiotics, Gluten meal, Lactose intolerance (gi), Flagyl [metronidazole hcl], and Morphine and related   Assessment / Plan:     Visit Diagnoses: Fibromyalgia: She has generalized hyperalgesia and positive tender points on exam.  She has been having more frequent and severe fibromyalgia flares over the past several months.  She was living with her daughter for 4 months at the beach this summer and increased her level of activity during that time which she feels has exacerbated her fibromyalgia.  She is currently having trapezius muscle tension and tenderness, tenderness over both elbow joints, trochanter bursitis of both hips, and generalized muscle tenderness.  She has also noticed hypersensitivity of her skin to touch.  She was evaluated by her PCP recently who started her on Lyrica 50 mg TID.  She has been taking lyrica only as needed for the past 1 week.  She has noticed some blurry vision after taking Lyrica.  We discussed the importance of starting on a low-dose at bedtime and then try increasing the dose of Lyrica as tolerated.  She was encouraged to continue to follow along with Dr. Brigitte Pulse if she does not notice improvement or cannot tolerate taking Lyrica.  We discussed the importance of regular exercise and good sleep hygiene.  We discussed water aerobics or water therapy and she plans on trying to find access to a pool.  We also discussed the importance of stretching or performing yoga  on a daily basis and she plans to look into chair yoga.  We also discussed the list of natural anti-inflammatories in detail which she plans on starting to take.  We also discussed the importance of following a plant-based diet and to avoid pro inflammatory foods.  We discussed trying massage therapy for myofascial pain releases in the future.  She would like to try these conservative measures but was advised to notify us if she continues to have frequent and severe flares.  She was advised to notify us if she would like a referral to physical therapy.  She will follow-up in the office in 6 months.  Other insomnia: She experiences interrupted sleep at night which worsens her insomnia.  We discussed the importance of good sleep hygiene.  Other fatigue: Chronic and secondary to insomnia.  We discussed the importance of regular exercise.  She plans to continue to take vitamin D and B complex supplement on a daily basis.  Primary osteoarthritis of both hands: She has PIP and DIP thickening consistent with osteoarthritis of both hands.  She experiences intermittent pain and stiffness in both hands.  We discussed the importance of joint protection and muscle strengthening.  She was given a list of natural anti-inflammatories which were discussed in detail today.  Primary osteoarthritis of both feet: She has been experiencing increased pain and stiffness in both feet.  No joint inflammation.  She has PIP and DIP thickening consistent with osteoarthritis of both feet.  No tenderness palpation on examination today.  She has recently started physical therapy. She was encouraged to start taking natural antiinflammatories.   Trochanteric bursitis of both hips: She has tenderness palpation over both hips.  She was given a handout of exercises to perform.  She was advised to notify us if her discomfort persists or worsens.  DDD (degenerative disc disease), cervical: She has good range of motion of the C-spine on exam.   She has trapezius muscle tension and muscle tenderness bilaterally.  She is advised  to notify us if she would like to return for trapezius trigger point injections in the future.  Osteopenia of multiple sites - She is on Prolia 60 mg subcutaneous injections every 6 months by her PCP.  She continues to take a vitamin D supplement as recommended.    Right hand paresthesia - She has been wearing a carpal tunnel night splint which has been alleviating her discomfort.    Other medical conditions are listed as follows:   Lymphocytic colitis  Chronic cholecystitis without calculus  History of posttraumatic stress disorder (PTSD): She has been following along closely with her therapist.    History of IBS  MGUS (monoclonal gammopathy of unknown significance)    Orders: No orders of the defined types were placed in this encounter.  No orders of the defined types were placed in this encounter.     Follow-Up Instructions: Return in about 6 months (around 06/28/2020) for Fibromyalgia, Osteoarthritis, DDD.   Ofilia Neas, PA-C  Note - This record has been created using Dragon software.  Chart creation errors have been sought, but may not always  have been located. Such creation errors do not reflect on  the standard of medical care.

## 2019-12-17 DIAGNOSIS — J3089 Other allergic rhinitis: Secondary | ICD-10-CM | POA: Diagnosis not present

## 2019-12-17 DIAGNOSIS — J3081 Allergic rhinitis due to animal (cat) (dog) hair and dander: Secondary | ICD-10-CM | POA: Diagnosis not present

## 2019-12-17 DIAGNOSIS — J301 Allergic rhinitis due to pollen: Secondary | ICD-10-CM | POA: Diagnosis not present

## 2019-12-18 DIAGNOSIS — F3181 Bipolar II disorder: Secondary | ICD-10-CM | POA: Diagnosis not present

## 2019-12-19 DIAGNOSIS — M810 Age-related osteoporosis without current pathological fracture: Secondary | ICD-10-CM | POA: Diagnosis not present

## 2019-12-19 DIAGNOSIS — F900 Attention-deficit hyperactivity disorder, predominantly inattentive type: Secondary | ICD-10-CM | POA: Diagnosis not present

## 2019-12-19 DIAGNOSIS — N951 Menopausal and female climacteric states: Secondary | ICD-10-CM | POA: Diagnosis not present

## 2019-12-19 DIAGNOSIS — R202 Paresthesia of skin: Secondary | ICD-10-CM | POA: Diagnosis not present

## 2019-12-19 DIAGNOSIS — M797 Fibromyalgia: Secondary | ICD-10-CM | POA: Diagnosis not present

## 2019-12-19 DIAGNOSIS — D472 Monoclonal gammopathy: Secondary | ICD-10-CM | POA: Diagnosis not present

## 2019-12-19 DIAGNOSIS — Z23 Encounter for immunization: Secondary | ICD-10-CM | POA: Diagnosis not present

## 2019-12-19 DIAGNOSIS — G43909 Migraine, unspecified, not intractable, without status migrainosus: Secondary | ICD-10-CM | POA: Diagnosis not present

## 2019-12-19 DIAGNOSIS — R140 Abdominal distension (gaseous): Secondary | ICD-10-CM | POA: Diagnosis not present

## 2019-12-19 DIAGNOSIS — R7301 Impaired fasting glucose: Secondary | ICD-10-CM | POA: Diagnosis not present

## 2019-12-19 DIAGNOSIS — E7800 Pure hypercholesterolemia, unspecified: Secondary | ICD-10-CM | POA: Diagnosis not present

## 2019-12-19 DIAGNOSIS — F3181 Bipolar II disorder: Secondary | ICD-10-CM | POA: Diagnosis not present

## 2019-12-22 ENCOUNTER — Other Ambulatory Visit: Payer: Self-pay | Admitting: Family Medicine

## 2019-12-22 DIAGNOSIS — R140 Abdominal distension (gaseous): Secondary | ICD-10-CM

## 2019-12-22 DIAGNOSIS — J3081 Allergic rhinitis due to animal (cat) (dog) hair and dander: Secondary | ICD-10-CM | POA: Diagnosis not present

## 2019-12-22 DIAGNOSIS — J301 Allergic rhinitis due to pollen: Secondary | ICD-10-CM | POA: Diagnosis not present

## 2019-12-22 DIAGNOSIS — J3089 Other allergic rhinitis: Secondary | ICD-10-CM | POA: Diagnosis not present

## 2019-12-23 ENCOUNTER — Other Ambulatory Visit: Payer: Self-pay

## 2019-12-23 ENCOUNTER — Other Ambulatory Visit: Payer: Self-pay | Admitting: Family Medicine

## 2019-12-23 ENCOUNTER — Encounter: Payer: Self-pay | Admitting: Physical Therapy

## 2019-12-23 ENCOUNTER — Ambulatory Visit: Payer: Medicare Other | Attending: Family Medicine | Admitting: Physical Therapy

## 2019-12-23 DIAGNOSIS — M6281 Muscle weakness (generalized): Secondary | ICD-10-CM | POA: Diagnosis not present

## 2019-12-23 DIAGNOSIS — M79671 Pain in right foot: Secondary | ICD-10-CM

## 2019-12-23 DIAGNOSIS — M79672 Pain in left foot: Secondary | ICD-10-CM | POA: Insufficient documentation

## 2019-12-23 DIAGNOSIS — Z1231 Encounter for screening mammogram for malignant neoplasm of breast: Secondary | ICD-10-CM

## 2019-12-23 DIAGNOSIS — R252 Cramp and spasm: Secondary | ICD-10-CM | POA: Diagnosis not present

## 2019-12-23 NOTE — Patient Instructions (Signed)
Access Code: [email or web address] URL: [email or web address] Date: 12/23/2019 Prepared by: Jari Favre  Exercises Seated Toe Raise - 3 x daily - 7 x weekly - 1 sets - 10 reps - 5 sec hold Supine Ankle Circles - 1 x daily - 7 x weekly - 3 sets - 10 reps

## 2019-12-23 NOTE — Therapy (Addendum)
[facility] [address], [place], [place], [address] Phone: [phone]   Fax:  [phone]  Physical Therapy Evaluation  Patient Details  Name: Danielle Bruce MRN: [id number] Date of Birth: [birth year] Referring Provider (PT): Mayra Neer, MD   Encounter Date: 12/23/2019   PT End of Session - 12/23/19 1732    Visit Number 1    Date for PT Re-Evaluation 03/16/20    Authorization Type Medicare/Medicaid    PT Start Time 1402    PT Stop Time 1448    PT Time Calculation (min) 46 min    Activity Tolerance Patient tolerated treatment well;Patient limited by pain    Behavior During Therapy [facility] for tasks assessed/performed           Past Medical History:  Diagnosis Date  . Anemia   . Anxiety   . Arthritis    osteoarthritis  . ASCUS (atypical squamous cells of undetermined significance) on Pap smear 05/06/05   NEG HIGH RISK HPV--C&B BIOPSY BENIGN 12/2005  . Asthma   . Bipolar 2 disorder ([place])   . Cancer ([place])    skin cancer - basal cell  . Colon polyps   . Complication of anesthesia    anxious afterwards, will get headaches   . Constipation   . Depression   . Fibromyalgia 10/2013  . GERD (gastroesophageal reflux disease)   . 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 Heart murmur    never had any problems  . Hemorrhoids   . High cholesterol   . 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   cytology negative  . IBS (irritable bowel syndrome)   . Insomnia   . Lymphocytic colitis   . Lymphoma ([place])   . 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 Migraines   . Osteoarthritis   . Osteopenia   . Peripheral neuropathy   . PTSD (post-traumatic stress disorder)     Past Surgical History:  Procedure Laterality Date  . BONE MARROW BIOPSY Left 12/18/2013   Plasma cell dyscrasia 8% population of plasma cells  . BREAST BIOPSY Right    benign  stereo  . CESAREAN SECTION  [phone]  . CHOLECYSTECTOMY N/A 10/16/2012   Procedure: LAPAROSCOPIC CHOLECYSTECTOMY;  Surgeon: Joyice Faster. Cornett, MD;  Location: [facility];  Service: General;  Laterality: N/A;  . COLONOSCOPY     numerous times  . DILATION AND CURETTAGE OF UTERUS    . ESOPHAGOGASTRODUODENOSCOPY    . [place]   x3  . IUD REMOVAL  02/2015   [place]rena  . LAPAROSCOPIC LYSIS OF ADHESIONS N/A 10/16/2012   Procedure: LAPAROSCOPIC LYSIS OF ADHESIONS;  Surgeon: Joyice Faster. Cornett, MD;  Location: [facility];  Service: General;  Laterality: N/A;  . LAPAROSCOPY N/A 10/16/2012   Procedure: LAPAROSCOPY DIAGNOSTIC;  Surgeon: Joyice Faster. Cornett, MD;  Location: [facility];  Service: General;  Laterality: N/A;  . PELVIC LAPAROSCOPY    . RADIOLOGY WITH ANESTHESIA N/A 12/16/2015   Procedure: MRI OF BRAIN WITH AND WITHOUT CONTRAST;  Surgeon: Medication Radiologist, MD;  Location: [place];  Service: Radiology;  Laterality: N/A;  . SHOULDER SURGERY  2007/2008  . SPINE SURGERY  2010   cervical    There were no vitals filed for this visit.    Subjective Assessment - 12/23/19 1406    Subjective I was at my daughters and the hardwood floors were really bad.  I have been home for a month and still hurts.  They (feet) hurt everywhere.  My fibro symptoms have been worse lately too and not sure if that has anything to do with it.  Been going on for several months.  Hurts with just light touch    Pertinent History OA bilateral feet; fibromyalgia; off cycle of steriod medicine    Limitations Standing;Walking;House hold activities    Patient Stated Goals be able to walk barefoot in house and reduce pain in feet; able to go for walks    Currently in Pain? Yes    Pain Score 6    standing/walking 8/10-10/10   Pain Location Foot    Pain Orientation Right;Left    Pain Descriptors / Indicators Aching;Pins and needles;Burning    Pain Type Chronic pain    Pain Radiating Towards all over the feet; around lateral  malleolus I have electrical feeling    Aggravating Factors  intial getting up; walking too much; walking barefoot    Pain Relieving Factors allegria shoes, berkinstocks; lyrica    Effect of Pain on Daily Activities going for daily walks; going out with grandkids    Multiple Pain Sites No              [facility] PT Assessment - 12/25/19 0001      Assessment   Medical Diagnosis M77.41,M77.42 (ICD-10-CM) - Metatarsalgia of both feet    Referring Provider (PT) Mayra Neer, MD    Onset Date/Surgical Date 07/29/19    Prior Therapy No, not for foot pain      Precautions   Precautions None      Restrictions   Weight Bearing Restrictions No      Balance Screen   Has the patient fallen in the past 6 months No      [place] residence    Living Arrangements Alone      Prior Function   Level of Independence Independent    Vocation On disability    Leisure childcare for grandchildren; walking      Cognition   Overall Cognitive Status Within Functional Limits for tasks assessed      Observation/Other Assessments   Focus on Therapeutic Outcomes (FOTO)  do at next visit      Posture/Postural Control   Posture/Postural Control Postural limitations    Postural Limitations Rounded Shoulders      ROM / Strength   AROM / PROM / Strength Strength;AROM;PROM      AROM   AROM Assessment Site Ankle    Right/Left Ankle Right;Left    Right Ankle Dorsiflexion 11    Right Ankle Inversion 35    Right Ankle Eversion 40    Left Ankle Dorsiflexion 5    Left Ankle Inversion 35    Left Ankle Eversion 20      Strength   Overall Strength Comments Lt hip adduction 3+/5 abduction 4/5; Rt hip adduction 4/5   toe ext 3/5 +pain   Strength Assessment Site Ankle    Right/Left Ankle Right;Left    Right Ankle Dorsiflexion 4+/5    Right Ankle Plantar Flexion 4/5    Right Ankle Inversion --   pain   Right Ankle Eversion --   pain   Left Ankle Dorsiflexion 4/5     Left Ankle Plantar Flexion 4-/5    Left Ankle Inversion 4+/5    Left Ankle Eversion 4+/5      Palpation   Palpation comment pain that is  significant throughout expecially lateral 5 METatarsal and all MET heads; dorsal feet      Ambulation/Gait   Gait Pattern Decreased stance time - right;Decreased stance time - left   flexed posture that immproves after severl steps   Gait Comments needs shoes to help normalize gait;                       Objective measurements completed on examination: See above findings.       [facility] Adult PT Treatment/Exercise - 12/25/19 0001      Self-Care   Self-Care Other Self-Care Comments    Other Self-Care Comments  initial HEP educated and performed today                  PT Education - 12/23/19 1733    Education Details Access Code: [email or web address]    Person(s) Educated Patient    Methods Explanation;Demonstration;Tactile cues;Verbal cues;Handout    Comprehension Verbalized understanding;Returned demonstration            PT Short Term Goals - 12/24/19 1524      PT SHORT TERM GOAL #1   Title be independent in initial HEP    Time 4    Period Weeks    Status New    Target Date 01/20/20      PT SHORT TERM GOAL #2   Title FOTO captured next visit    Time 1    Period Weeks    Status New    Target Date 12/30/19             PT Long Term Goals - 12/24/19 1522      PT LONG TERM GOAL #1   Title be independent in advanced HEP    Time 12    Period Weeks    Target Date 03/16/20      PT LONG TERM GOAL #2   Title Able to walk around her home without wearing shoes or only wearing house shoes    Time 12    Period Weeks    Status New    Target Date 03/16/20      PT LONG TERM GOAL #3   Title report 75% less neck pain throughout the day with normal functional activities    Time 12    Period Weeks    Status New    Target Date 03/16/20      PT LONG TERM GOAL #4   Title able to go for 2 mile walk at least 3x/week for  exercise    Time 12    Period Weeks    Status New    Target Date 03/16/20      PT LONG TERM GOAL #5   Title ankle strength 5/5 throughout in bilateral feet for improved stability during single leg activities to reduce risk of falls    Time 12    Period Weeks    Status New    Target Date 03/16/20                  Plan - 12/24/19 1525    Clinical Impression Statement Pt presents to skilled PT due to bilateral foot pain and comorbidities as mentioned which make it more challenging for patient to build strength.  Pt was recently on steriods for inflammation d/t fibromyalgia.  Medication causes weight gain and now that she is off she would like to resume activities to help manage weight and resume activities such as helping to care  for grandchildren.  Pt has decreased strength and ROM of bilat ankles.  Pt has pain with AROM and TTP throughout plantar muscles. Pt will benefit from skilled PT to work on pain management and strengthening so she can resume activities of healthy lifestyle.    Personal Factors and Comorbidities Comorbidity 2    Comorbidities OA bilat feet; fibromyalgia    Stability/Clinical Decision Making Evolving/Moderate complexity    Clinical Decision Making Low    Rehab Potential Good    PT Frequency 1x / week    PT Duration 12 weeks    PT Treatment/Interventions ADLs/Self Care Home Management;Cryotherapy;Electrical Stimulation;Iontophoresis 67m/ml Dexamethasone;Moist Heat;Ultrasound;Neuromuscular re-education;Therapeutic exercise;Therapeutic activities;Patient/family education;Manual techniques;Dry needling;Passive range of motion;Taping    PT Next Visit Plan pain management; ankle and toe ROM and isometric strength; toe pick ups, ktape to support arches; STM/MFR to plantar and calf mm    PT Home Exercise Plan Access Code: [email or web address]    Consulted and Agree with Plan of Care Patient           Patient will benefit from skilled therapeutic intervention in order to  improve the following deficits and impairments:     Visit Diagnosis: Pain in right foot  Pain in left foot  Muscle weakness (generalized)  Cramp and spasm     Problem List Patient Active Problem List   Diagnosis Date Noted  . GAD (generalized anxiety disorder) 12/26/2017  . OCD (obsessive compulsive disorder) 12/26/2017  . PTSD (post-traumatic stress disorder) 12/26/2017  . DDD (degenerative disc disease), cervical 07/14/2016  . Primary osteoarthritis of both feet 07/14/2016  . Primary osteoarthritis of both hands 07/14/2016  . Other fatigue 07/14/2016  . History of IBS 07/14/2016  . Osteopenia of multiple sites 07/14/2016  . Fibromyalgia 01/13/2016  . MGUS (monoclonal gammopathy of unknown significance) 12/23/2013  . Chronic cholecystitis without calculus 10/18/2012  . Abdominal pain, unspecified site 10/18/2012  . Nausea alone 10/18/2012  . Unspecified constipation 10/18/2012  . Depression 10/18/2012  . Anxiety   . ASCUS (atypical squamous cells of undetermined significance) on Pap smear   . IUD   . Hemorrhoids 11/29/2010  . Abdominal pain, left upper quadrant 11/29/2010    JJule Ser PT 12/25/2019, 4:01 PM  [facility] [address] [place] [address]Phone: [phone]  Fax:  [phone] Name: CAlaija RubleMRN: [id number] of Birth: 9Aug 29, 1962

## 2019-12-24 ENCOUNTER — Ambulatory Visit
Admission: RE | Admit: 2019-12-24 | Discharge: 2019-12-24 | Disposition: A | Discharge status: Home / Self Care | Payer: Medicare Other | Source: Ambulatory Visit | Attending: Family Medicine | Admitting: Family Medicine

## 2019-12-24 DIAGNOSIS — Z1231 Encounter for screening mammogram for malignant neoplasm of breast: Secondary | ICD-10-CM | POA: Diagnosis not present

## 2019-12-25 NOTE — Addendum Note (Signed)
Addended by: Su Hoff on: 12/25/2019 04:03 PM   Modules accepted: Orders

## 2019-12-26 DIAGNOSIS — K5904 Chronic idiopathic constipation: Secondary | ICD-10-CM | POA: Diagnosis not present

## 2019-12-26 DIAGNOSIS — R140 Abdominal distension (gaseous): Secondary | ICD-10-CM | POA: Diagnosis not present

## 2019-12-26 DIAGNOSIS — Z86010 Personal history of colonic polyps: Secondary | ICD-10-CM | POA: Diagnosis not present

## 2019-12-26 DIAGNOSIS — Z8719 Personal history of other diseases of the digestive system: Secondary | ICD-10-CM | POA: Diagnosis not present

## 2019-12-29 DIAGNOSIS — J3081 Allergic rhinitis due to animal (cat) (dog) hair and dander: Secondary | ICD-10-CM | POA: Diagnosis not present

## 2019-12-29 DIAGNOSIS — J3089 Other allergic rhinitis: Secondary | ICD-10-CM | POA: Diagnosis not present

## 2019-12-29 DIAGNOSIS — J301 Allergic rhinitis due to pollen: Secondary | ICD-10-CM | POA: Diagnosis not present

## 2019-12-30 ENCOUNTER — Encounter: Payer: Self-pay | Admitting: Physician Assistant

## 2019-12-30 ENCOUNTER — Other Ambulatory Visit: Payer: Self-pay

## 2019-12-30 ENCOUNTER — Ambulatory Visit (INDEPENDENT_AMBULATORY_CARE_PROVIDER_SITE_OTHER): Payer: Medicare Other | Admitting: Physician Assistant

## 2019-12-30 VITALS — BP 126/74 | HR 79 | Resp 14 | Ht 62.0 in | Wt 140.0 lb

## 2019-12-30 DIAGNOSIS — M19041 Primary osteoarthritis, right hand: Secondary | ICD-10-CM

## 2019-12-30 DIAGNOSIS — G4709 Other insomnia: Secondary | ICD-10-CM | POA: Diagnosis not present

## 2019-12-30 DIAGNOSIS — M19071 Primary osteoarthritis, right ankle and foot: Secondary | ICD-10-CM | POA: Diagnosis not present

## 2019-12-30 DIAGNOSIS — M5030 Other cervical disc degeneration, unspecified cervical region: Secondary | ICD-10-CM

## 2019-12-30 DIAGNOSIS — M8589 Other specified disorders of bone density and structure, multiple sites: Secondary | ICD-10-CM | POA: Diagnosis not present

## 2019-12-30 DIAGNOSIS — R202 Paresthesia of skin: Secondary | ICD-10-CM | POA: Diagnosis not present

## 2019-12-30 DIAGNOSIS — K811 Chronic cholecystitis: Secondary | ICD-10-CM | POA: Diagnosis not present

## 2019-12-30 DIAGNOSIS — M19072 Primary osteoarthritis, left ankle and foot: Secondary | ICD-10-CM

## 2019-12-30 DIAGNOSIS — Z8659 Personal history of other mental and behavioral disorders: Secondary | ICD-10-CM

## 2019-12-30 DIAGNOSIS — K52832 Lymphocytic colitis: Secondary | ICD-10-CM

## 2019-12-30 DIAGNOSIS — M797 Fibromyalgia: Secondary | ICD-10-CM | POA: Diagnosis not present

## 2019-12-30 DIAGNOSIS — M19042 Primary osteoarthritis, left hand: Secondary | ICD-10-CM

## 2019-12-30 DIAGNOSIS — R5383 Other fatigue: Secondary | ICD-10-CM

## 2019-12-30 DIAGNOSIS — D472 Monoclonal gammopathy: Secondary | ICD-10-CM

## 2019-12-30 DIAGNOSIS — F3181 Bipolar II disorder: Secondary | ICD-10-CM

## 2019-12-30 DIAGNOSIS — M7061 Trochanteric bursitis, right hip: Secondary | ICD-10-CM | POA: Diagnosis not present

## 2019-12-30 DIAGNOSIS — Z8719 Personal history of other diseases of the digestive system: Secondary | ICD-10-CM

## 2019-12-30 DIAGNOSIS — M7062 Trochanteric bursitis, left hip: Secondary | ICD-10-CM

## 2019-12-30 NOTE — Patient Instructions (Signed)
Hip Exercises Ask your health care provider which exercises are safe for you. Do exercises exactly as told by your health care provider and adjust them as directed. It is normal to feel mild stretching, pulling, tightness, or discomfort as you do these exercises. Stop right away if you feel sudden pain or your pain gets worse. Do not begin these exercises until told by your health care provider. Stretching and range-of-motion exercises These exercises warm up your muscles and joints and improve the movement and flexibility of your hip. These exercises also help to relieve pain, numbness, and tingling. You may be asked to limit your range of motion if you had a hip replacement. Talk to your health care provider about these restrictions. Hamstrings, supine  1. Lie on your back (supine position). 2. Loop a belt or towel over the ball of your left / right foot. The ball of your foot is on the walking surface, right under your toes. 3. Straighten your left / right knee and slowly pull on the belt or towel to raise your leg until you feel a gentle stretch behind your knee (hamstring). ? Do not let your knee bend while you do this. ? Keep your other leg flat on the floor. 4. Hold this position for __________ seconds. 5. Slowly return your leg to the starting position. Repeat __________ times. Complete this exercise __________ times a day. Hip rotation  1. Lie on your back on a firm surface. 2. With your left / right hand, gently pull your left / right knee toward the shoulder that is on the same side of the body. Stop when your knee is pointing toward the ceiling. 3. Hold your left / right ankle with your other hand. 4. Keeping your knee steady, gently pull your left / right ankle toward your other shoulder until you feel a stretch in your buttocks. ? Keep your hips and shoulders firmly planted while you do this stretch. 5. Hold this position for __________ seconds. Repeat __________ times. Complete  this exercise __________ times a day. Seated stretch This exercise is sometimes called hamstrings and adductors stretch. 1. Sit on the floor with your legs stretched wide. Keep your knees straight during this exercise. 2. Keeping your head and back in a straight line, bend at your waist to reach for your left foot (position A). You should feel a stretch in your right inner thigh (adductors). 3. Hold this position for __________ seconds. Then slowly return to the upright position. 4. Keeping your head and back in a straight line, bend at your waist to reach forward (position B). You should feel a stretch behind both of your thighs and knees (hamstrings). 5. Hold this position for __________ seconds. Then slowly return to the upright position. 6. Keeping your head and back in a straight line, bend at your waist to reach for your right foot (position C). You should feel a stretch in your left inner thigh (adductors). 7. Hold this position for __________ seconds. Then slowly return to the upright position. Repeat __________ times. Complete this exercise __________ times a day. Lunge This exercise stretches the muscles of the hip (hip flexors). 1. Place your left / right knee on the floor and bend your other knee so that is directly over your ankle. You should be half-kneeling. 2. Keep good posture with your head over your shoulders. 3. Tighten your buttocks to point your tailbone downward. This will prevent your back from arching too much. 4. You should feel a   gentle stretch in the front of your left / right thigh and hip. If you do not feel a stretch, slide your other foot forward slightly and then slowly lunge forward with your chest up until your knee once again lines up over your ankle. ? Make sure your tailbone continues to point downward. 5. Hold this position for __________ seconds. 6. Slowly return to the starting position. Repeat __________ times. Complete this exercise __________ times a  day. Strengthening exercises These exercises build strength and endurance in your hip. Endurance is the ability to use your muscles for a long time, even after they get tired. Bridge This exercise strengthens the muscles of your hip (hip extensors). 1. Lie on your back on a firm surface with your knees bent and your feet flat on the floor. 2. Tighten your buttocks muscles and lift your bottom off the floor until the trunk of your body and your hips are level with your thighs. ? Do not arch your back. ? You should feel the muscles working in your buttocks and the back of your thighs. If you do not feel these muscles, slide your feet 1-2 inches (2.5-5 cm) farther away from your buttocks. 3. Hold this position for __________ seconds. 4. Slowly lower your hips to the starting position. 5. Let your muscles relax completely between repetitions. Repeat __________ times. Complete this exercise __________ times a day. Straight leg raises, side-lying This exercise strengthens the muscles that move the hip joint away from the center of the body (hip abductors). 1. Lie on your side with your left / right leg in the top position. Lie so your head, shoulder, hip, and knee line up. You may bend your bottom knee slightly to help you balance. 2. Roll your hips slightly forward, so your hips are stacked directly over each other and your left / right knee is facing forward. 3. Leading with your heel, lift your top leg 4-6 inches (10-15 cm). You should feel the muscles in your top hip lifting. ? Do not let your foot drift forward. ? Do not let your knee roll toward the ceiling. 4. Hold this position for __________ seconds. 5. Slowly return to the starting position. 6. Let your muscles relax completely between repetitions. Repeat __________ times. Complete this exercise __________ times a day. Straight leg raises, side-lying This exercise strengthens the muscles that move the hip joint toward the center of the  body (hip adductors). 1. Lie on your side with your left / right leg in the bottom position. Lie so your head, shoulder, hip, and knee line up. You may place your upper foot in front to help you balance. 2. Roll your hips slightly forward, so your hips are stacked directly over each other and your left / right knee is facing forward. 3. Tense the muscles in your inner thigh and lift your bottom leg 4-6 inches (10-15 cm). 4. Hold this position for __________ seconds. 5. Slowly return to the starting position. 6. Let your muscles relax completely between repetitions. Repeat __________ times. Complete this exercise __________ times a day. Straight leg raises, supine This exercise strengthens the muscles in the front of your thigh (quadriceps). 1. Lie on your back (supine position) with your left / right leg extended and your other knee bent. 2. Tense the muscles in the front of your left / right thigh. You should see your kneecap slide up or see increased dimpling just above your knee. 3. Keep these muscles tight as you raise your   leg 4-6 inches (10-15 cm) off the floor. Do not let your knee bend. 4. Hold this position for __________ seconds. 5. Keep these muscles tense as you lower your leg. 6. Relax the muscles slowly and completely between repetitions. Repeat __________ times. Complete this exercise __________ times a day. Hip abductors, standing This exercise strengthens the muscles that move the leg and hip joint away from the center of the body (hip abductors). 1. Tie one end of a rubber exercise band or tubing to a secure surface, such as a chair, table, or pole. 2. Loop the other end of the band or tubing around your left / right ankle. 3. Keeping your ankle with the band or tubing directly opposite the secured end, step away until there is tension in the tubing or band. Hold on to a chair, table, or pole as needed for balance. 4. Lift your left / right leg out to your side. While you do  this: ? Keep your back upright. ? Keep your shoulders over your hips. ? Keep your toes pointing forward. ? Make sure to use your hip muscles to slowly lift your leg. Do not tip your body or forcefully lift your leg. 5. Hold this position for __________ seconds. 6. Slowly return to the starting position. Repeat __________ times. Complete this exercise __________ times a day. Squats This exercise strengthens the muscles in the front of your thigh (quadriceps). 1. Stand in a door frame so your feet and knees are in line with the frame. You may place your hands on the frame for balance. 2. Slowly bend your knees and lower your hips like you are going to sit in a chair. ? Keep your lower legs in a straight-up-and-down position. ? Do not let your hips go lower than your knees. ? Do not bend your knees lower than told by your health care provider. ? If your hip pain increases, do not bend as low. 3. Hold this position for ___________ seconds. 4. Slowly push with your legs to return to standing. Do not use your hands to pull yourself to standing. Repeat __________ times. Complete this exercise __________ times a day. This information is not intended to replace advice given to you by your health care provider. Make sure you discuss any questions you have with your health care provider. Document Revised: 09/19/2018 Document Reviewed: 12/25/2017 [employer] Patient Education  2020 [employer]  

## 2019-12-31 ENCOUNTER — Encounter: Payer: Self-pay | Admitting: Physical Therapy

## 2019-12-31 ENCOUNTER — Other Ambulatory Visit: Payer: Self-pay

## 2019-12-31 ENCOUNTER — Ambulatory Visit: Payer: Medicare Other | Attending: Family Medicine | Admitting: Physical Therapy

## 2019-12-31 ENCOUNTER — Ambulatory Visit
Admission: RE | Admit: 2019-12-31 | Discharge: 2019-12-31 | Disposition: A | Discharge status: Home / Self Care | Payer: Medicare Other | Source: Ambulatory Visit | Attending: Family Medicine | Admitting: Family Medicine

## 2019-12-31 DIAGNOSIS — M6281 Muscle weakness (generalized): Secondary | ICD-10-CM

## 2019-12-31 DIAGNOSIS — M79671 Pain in right foot: Secondary | ICD-10-CM | POA: Diagnosis not present

## 2019-12-31 DIAGNOSIS — Z9049 Acquired absence of other specified parts of digestive tract: Secondary | ICD-10-CM | POA: Diagnosis not present

## 2019-12-31 DIAGNOSIS — M79672 Pain in left foot: Secondary | ICD-10-CM | POA: Insufficient documentation

## 2019-12-31 DIAGNOSIS — R252 Cramp and spasm: Secondary | ICD-10-CM

## 2019-12-31 DIAGNOSIS — J3089 Other allergic rhinitis: Secondary | ICD-10-CM | POA: Diagnosis not present

## 2019-12-31 DIAGNOSIS — R140 Abdominal distension (gaseous): Secondary | ICD-10-CM

## 2019-12-31 DIAGNOSIS — J301 Allergic rhinitis due to pollen: Secondary | ICD-10-CM | POA: Diagnosis not present

## 2019-12-31 DIAGNOSIS — J3081 Allergic rhinitis due to animal (cat) (dog) hair and dander: Secondary | ICD-10-CM | POA: Diagnosis not present

## 2019-12-31 NOTE — Therapy (Signed)
[facility] [address] [place], [place], [address] Phone: [phone]   Fax:  [phone]  Physical Therapy Treatment  Patient Details  Name: Danielle Bruce MRN: [id number] Date of Birth: 1960/07/06 Referring Provider (PT): Mayra Neer, MD   Encounter Date: 12/31/2019   PT End of Session - 12/31/19 1710    Visit Number 2    Date for PT Re-Evaluation 03/16/20    Authorization Type Medicare/Medicaid    PT Start Time 8185    PT Stop Time 1703    PT Time Calculation (min) 46 min    Activity Tolerance Patient tolerated treatment well;Patient limited by pain    Behavior During Therapy [facility] for tasks assessed/performed           Past Medical History:  Diagnosis Date  . Anemia   . Anxiety   . Arthritis    osteoarthritis  . ASCUS (atypical squamous cells of undetermined significance) on Pap smear 05/06/05   NEG HIGH RISK HPV--C&B BIOPSY BENIGN 12/2005  . Asthma   . Bipolar 2 disorder ([place])   . Cancer ([place])    skin cancer - basal cell  . Colon polyps   . Complication of anesthesia    anxious afterwards, will get headaches   . Constipation   . Depression   . Fibromyalgia 10/2013  . GERD (gastroesophageal reflux disease)   . 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 Heart murmur    never had any problems  . Hemorrhoids   . High cholesterol   . 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   cytology negative  . IBS (irritable bowel syndrome)   . Insomnia   . Lymphocytic colitis   . Lymphoma ([place])   . 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 Migraines   . Osteoarthritis   . Osteopenia   . Peripheral neuropathy   . PTSD (post-traumatic stress disorder)     Past Surgical History:  Procedure Laterality Date  . BONE MARROW BIOPSY Left 12/18/2013   Plasma cell dyscrasia 8% population of plasma cells  . BREAST BIOPSY Right    benign stereo   . CESAREAN SECTION  [phone]  . CHOLECYSTECTOMY N/A 10/16/2012   Procedure: LAPAROSCOPIC CHOLECYSTECTOMY;  Surgeon: Joyice Faster. Cornett, MD;  Location: [facility];  Service: General;  Laterality: N/A;  . COLONOSCOPY     numerous times  . DILATION AND CURETTAGE OF UTERUS    . ESOPHAGOGASTRODUODENOSCOPY    . [place]   x3  . IUD REMOVAL  02/2015   Mirena  . LAPAROSCOPIC LYSIS OF ADHESIONS N/A 10/16/2012   Procedure: LAPAROSCOPIC LYSIS OF ADHESIONS;  Surgeon: Joyice Faster. Cornett, MD;  Location: [facility];  Service: General;  Laterality: N/A;  . LAPAROSCOPY N/A 10/16/2012   Procedure: LAPAROSCOPY DIAGNOSTIC;  Surgeon: Joyice Faster. Cornett, MD;  Location: [facility];  Service: General;  Laterality: N/A;  . PELVIC LAPAROSCOPY    . RADIOLOGY WITH ANESTHESIA N/A 12/16/2015   Procedure: MRI OF BRAIN WITH AND WITHOUT CONTRAST;  Surgeon: Medication Radiologist, MD;  Location: [place];  Service: Radiology;  Laterality: N/A;  . SHOULDER SURGERY  2007/2008  . SPINE SURGERY  2010   cervical    There were no vitals filed for this visit.   Subjective Assessment - 12/31/19 1619    Subjective Pt states she went for a walk today 1.45 miles and it took over  an hour with stopping and resting.  Pain is transferring up to my shoulders    Currently in Pain? Yes    Pain Score 5     Pain Location Foot    Pain Orientation Right;Left    Pain Descriptors / Indicators Aching    Pain Type Chronic pain    Pain Frequency Intermittent                             [facility] Adult PT Treatment/Exercise - 12/31/19 0001      Manual Therapy   Manual Therapy Myofascial release    Myofascial Release bilateral gastroc, soleus, peronelas, plantar muscles; upper traps                    PT Short Term Goals - 12/24/19 1524      PT SHORT TERM GOAL #1   Title be independent in initial HEP    Time 4    Period Weeks    Status New    Target Date 01/20/20      PT SHORT TERM GOAL #2   Title FOTO  captured next visit    Time 1    Period Weeks    Status New    Target Date 12/30/19             PT Long Term Goals - 12/24/19 1522      PT LONG TERM GOAL #1   Title be independent in advanced HEP    Time 12    Period Weeks    Target Date 03/16/20      PT LONG TERM GOAL #2   Title Able to walk around her home without wearing shoes or only wearing house shoes    Time 12    Period Weeks    Status New    Target Date 03/16/20      PT LONG TERM GOAL #3   Title report 75% less neck pain throughout the day with normal functional activities    Time 12    Period Weeks    Status New    Target Date 03/16/20      PT LONG TERM GOAL #4   Title able to go for 2 mile walk at least 3x/week for exercise    Time 12    Period Weeks    Status New    Target Date 03/16/20      PT LONG TERM GOAL #5   Title ankle strength 5/5 throughout in bilateral feet for improved stability during single leg activities to reduce risk of falls    Time 12    Period Weeks    Status New    Target Date 03/16/20                 Plan - 12/31/19 1710    Clinical Impression Statement Today's session focused on MFR for pain management.  Pt has fascial restrictions along the entire posterior kinetic chain with maximum time spent on Rt calf and Rt upper trap.  Pt had release of fascia.  She had no change of gait immediately after treatment.  No goals met as today is the first follow up.    PT Treatment/Interventions ADLs/Self Care Home Management;Cryotherapy;Electrical Stimulation;Iontophoresis 32m/ml Dexamethasone;Moist Heat;Ultrasound;Neuromuscular re-education;Therapeutic exercise;Therapeutic activities;Patient/family education;Manual techniques;Dry needling;Passive range of motion;Taping    PT Next Visit Plan pain management; ankle and toe ROM and isometric strength; toe pick ups, ktape to support arches;  STM/MFR to plantar and calf mm    PT Home Exercise Plan Access Code: [email or web address]    Consulted and  Agree with Plan of Care Patient           Patient will benefit from skilled therapeutic intervention in order to improve the following deficits and impairments:  Abnormal gait, Increased fascial restricitons, Difficulty walking, Decreased range of motion, Pain, Decreased strength, Postural dysfunction  Visit Diagnosis: Pain in right foot  Pain in left foot  Muscle weakness (generalized)  Cramp and spasm     Problem List Patient Active Problem List   Diagnosis Date Noted  . GAD (generalized anxiety disorder) 12/26/2017  . OCD (obsessive compulsive disorder) 12/26/2017  . PTSD (post-traumatic stress disorder) 12/26/2017  . DDD (degenerative disc disease), cervical 07/14/2016  . Primary osteoarthritis of both feet 07/14/2016  . Primary osteoarthritis of both hands 07/14/2016  . Other fatigue 07/14/2016  . History of IBS 07/14/2016  . Osteopenia of multiple sites 07/14/2016  . Fibromyalgia 01/13/2016  . MGUS (monoclonal gammopathy of unknown significance) 12/23/2013  . Chronic cholecystitis without calculus 10/18/2012  . Abdominal pain, unspecified site 10/18/2012  . Nausea alone 10/18/2012  . Unspecified constipation 10/18/2012  . Depression 10/18/2012  . Anxiety   . ASCUS (atypical squamous cells of undetermined significance) on Pap smear   . IUD   . Hemorrhoids 11/29/2010  . Abdominal pain, left upper quadrant 11/29/2010    Jule Ser, PT 12/31/2019, 5:17 PM  [facility] [address], [place], [place], [address] Phone: [phone]   Fax:  [phone]  Name: Danielle Bruce MRN: [id number] Date of Birth: Dec 10, 1960

## 2020-01-01 ENCOUNTER — Encounter: Payer: Self-pay | Admitting: Psychiatry

## 2020-01-01 ENCOUNTER — Ambulatory Visit (INDEPENDENT_AMBULATORY_CARE_PROVIDER_SITE_OTHER): Payer: Medicare Other | Admitting: Psychiatry

## 2020-01-01 DIAGNOSIS — F411 Generalized anxiety disorder: Secondary | ICD-10-CM | POA: Diagnosis not present

## 2020-01-01 DIAGNOSIS — F3181 Bipolar II disorder: Secondary | ICD-10-CM

## 2020-01-01 DIAGNOSIS — F331 Major depressive disorder, recurrent, moderate: Secondary | ICD-10-CM

## 2020-01-01 DIAGNOSIS — F4310 Post-traumatic stress disorder, unspecified: Secondary | ICD-10-CM

## 2020-01-01 DIAGNOSIS — F902 Attention-deficit hyperactivity disorder, combined type: Secondary | ICD-10-CM | POA: Diagnosis not present

## 2020-01-01 DIAGNOSIS — F4001 Agoraphobia with panic disorder: Secondary | ICD-10-CM

## 2020-01-01 DIAGNOSIS — F422 Mixed obsessional thoughts and acts: Secondary | ICD-10-CM | POA: Diagnosis not present

## 2020-01-01 MED ORDER — FLUVOXAMINE MALEATE 25 MG PO TABS: 25.0000 mg | ORAL_TABLET | Freq: Two times a day (BID) | ORAL | 0 refills | Status: DC

## 2020-01-01 NOTE — Progress Notes (Signed)
Danielle Bruce [id number] 1960/05/01 59 y.o.   Subjective:   Patient ID:  Danielle Bruce is a 59 y.o. (DOB [birth year]) female.  Chief Complaint:  Chief Complaint  Patient presents with  . Follow-up    mood and meds  . Anxiety  . ADHD    HPI Danielle Bruce presents for follow-up of multiple dxes.  visit in April and taken off lamotrigine DT possible skin reaction.  Had appt with [facility] dermatology and they assume that skin reaction with itching was related to lamotrigine. 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 Per oncology most likely drug is lamotrigine. And she weaned off of the medication to determine if the cutaneous lesions are related. Lesions seem less without lamotrigine.  At this point skin reaction is presumed related to lamotrigine.  No other disease sx. She is well aware that she has been on lamotrigine for many years with good results for depression control. There is a risk of relapse as she comes off of the medication and she is worried about that. Smart phone helps her cognitive  And other productivity.    seen October 2020.  Restarted low dosage ADD bc concerns over STM, concentration and productivity.  Ritalin 5 mg twice daily.  Also suggested she retry N-acetylcysteine for mild cognitive complaints.  Last seen April 07, 2019 the following was noted: Mostly good.  Anxiety is mostly better, but depression is a little worse seasonally.  Not able to go help her daughter.   Low energy but does have motivation.  Ritalin helps energy and focus but doesn't take it regularly bc irregular sleep cycle.   Ativan only when having medical issues.    Therapy working on trauma issues is very hard.  Struggling with the fact she doesn't have memory of large parts of her past.   No manic sx off mood stabilizers.  Covid isolation reduce  stressors overall and not prominently depressed.   No meds were changed.  June 25, 2019 appointment the following is noted: F real worried about Covid despite vaccination.   Started fluvoxamine 25 on May 14, 2019 from PCP mainly for inflammation but possibly thinking she might be depressed. Seen benefit for energy and sleep pattern and more appropriate feelings and less numb.  Now realizes she had been depressed for awhile but just numb with depression.  Anniversaries of deaths of friends including last BF [name] a little over a year ago.   Thinks of him daily. His 57 yo D died of unknown cause recently.  Stress D [place] to beach. Wonders about increasing the fluvoxamine  [name] living in [place] and hasn't seen him in a year. [name] on disability for depression. Patient reports more depression.  Plan: Realized lately she's depressed and started Luvox again for it and for the anti-inflammatory potential for her health.  Has seen some benefit.   Prior benefit at 25 mg daily.  Optiion increase but slightly bc med sensitive. AGREE increase fluvoxamine to 37.5 mg daily.  08/06/2019 appointment with the following noted: Increased fluvoxamine to 37.5 mg and it helped the depression. Could see a big difference and had been more depressed than she even realized. Outlook better and sleep pattern normalized.    More energy and motivation.   But also started having prolonged HA.  HA lasted a couple of weeks.   Assumed HA was DT increased fluvoxamine so reduced to  25 mg on 07/28/19.  07/29/19 Episode of confusion occurred also.  Went to doctor while confused.  They couldn't determine cause.    Resolved in 1 day. FM flare up since fall/winter. To beach with daughter 3 mos minimum to give her shots for IVF treatment. Patient reports panic recently and recent difficulty with anxiety.    Occ flashbacks.   Patient denies difficulty with sleep initiation or maintenance. Denies appetite disturbance.  Patient reports  that energy and motivation have been good.  Patient denies any difficulty with concentration.  Patient denies any suicidal ideation. Plan: AGREE increase fluvoxamine to 37.5 mg daily once HA is controlled for several weekcs.  01/01/20 appt with the following noted: Increased fluvoxamine for awhile but got more HA and reduced it. Anxiety is better in general now.  But is interested in increasing it DT coming winter. When anxiety is not good it catches her off guard. Likely had Covid in August but early test was negative.  Got prednisone but sick for 5 weeks.  Lost taste and smell still going on.  D also had Covid.  Felt really sick and terrified with bad mental health at the time.  Was traumatic for her.  Had been vaccinated.   Had panic attack at the ER and took a long time to get the Ativan.  D moving back to area and pt will have to help with childcare so hopes to more aggressively treat her health problems. 2 gkids and 2 more coming. [name] married without kids yet.   Past psych meds: Lithium, carbamazepine, oxcarbazepine, topiramate, valproic acid, lamotrigine, gabapentin  Zyprexa, Seroquel, Latuda headaches, Geodon,  Risperidone, Stelazine,  Rexulti,  Wellbutrin with side effects,  sertraline irritability, duloxetine, fluvoxamine 25, fluoxetine, Stopped desipramine DT skin issues which now resolved.  It helped GI px.  buspirone side effects,   N-acetylcysteine, pramipexole,  methylphenidate,  Adderall,   Ambien with amnesia, trazodone, Ativan  Review of Systems:  Review of Systems  Constitutional: Positive for fatigue.  Cardiovascular: Negative for chest pain.  Gastrointestinal: Positive for abdominal distention and abdominal pain. Negative for vomiting.  Musculoskeletal: Positive for myalgias.  Neurological: Positive for dizziness and headaches. Negative for tremors and weakness.  Psychiatric/Behavioral: Positive for dysphoric mood. Negative for agitation, behavioral problems,  confusion, hallucinations, self-injury and suicidal ideas. The patient is nervous/anxious.   Less HA with new meds.  Medications: I have reviewed the patient's current medications.  Current Outpatient Medications  Medication Sig Dispense Refill  . albuterol (ACCUNEB) 0.63 MG/3ML nebulizer solution Take 1 ampule by nebulization every 6 (six) hours as needed for wheezing.    Marland Kitchen albuterol (PROVENTIL HFA;VENTOLIN HFA) 108 (90 Base) MCG/ACT inhaler Inhale 2 puffs into the lungs every 6 (six) hours as needed for wheezing or shortness of breath.    . Ascorbic Acid (VITAMIN C) 100 MG tablet Take 100 mg by mouth daily.    Marland Kitchen azelastine (ASTELIN) 0.1 % nasal spray Place into both nostrils 2 (two) times daily. Use in each nostril as directed    . Azelastine HCl 0.15 % SOLN Place 2 sprays into both nostrils daily.    Marland Kitchen BEPREVE 1.5 % SOLN Place 1 drop into both eyes daily.     Marland Kitchen BREO ELLIPTA 200-25 MCG/INH AEPB 1 puff daily.    . Butalbital-APAP-Caffeine 50-300-40 MG CAPS butalbital-acetaminophen-caffeine 50 mg-300 mg-40 mg capsule  TAKE 1 CAPSULE BY MOUTH EVERY 4 HOURS AS NEEDED FOR MIGRAINE    . cyclobenzaprine (FLEXERIL) 10 MG tablet Take 10  mg by mouth at bedtime as needed.    Marland Kitchen dexlansoprazole (DEXILANT) 60 MG capsule Dexilant 60 mg capsule, delayed release  TAKE 1 CAPSULE BY MOUTH EVERY DAY    . diphenhydrAMINE (BENADRYL) 25 mg capsule Take 50 mg by mouth every 6 (six) hours as needed (HEADACHES).     Marland Kitchen EPINEPHrine 0.3 mg/0.3 mL IJ SOAJ injection as needed.    Eduard Roux (AIMOVIG) 70 MG/ML SOAJ Inject into the skin.    . fluorouracil (EFUDEX) 5 % cream APPLY EXTERNALLY TO THE AFFECTED AREA ON THE SKIN TWICE DAILY FOR 2 WEEKS    . fluticasone (FLONASE) 50 MCG/ACT nasal spray Place 2 sprays into both nostrils daily. 16 g 0  . fluvoxaMINE (LUVOX) 25 MG tablet TAKE 1 TABLET( 25 MG TOTAL) BY MOUTH TWICE DAILY 180 tablet 0  . hydrOXYzine (ATARAX/VISTARIL) 25 MG tablet TK 1 TO 2 TS PO TID PRF MIGRAINE   4  . L-methylfolate Calcium 15 MG TABS TAKE 1 TABLET BY MOUTH DAILY 30 tablet 5  . levocetirizine (XYZAL) 5 MG tablet Take 5 mg by mouth every evening.  5  . LORazepam (ATIVAN) 1 MG tablet Take 1 tablet (1 mg total) by mouth every 8 (eight) hours as needed for anxiety. (takes 2 mg when having a medical procedure.) 30 tablet 1  . LORazepam (ATIVAN) 2 MG/ML concentrated solution Take 0.5 mLs (1 mg total) by mouth every 8 (eight) hours as needed for anxiety (severe panic). 30 mL 1  . MELATONIN PO Take 1.5 mg by mouth at bedtime.    . methylphenidate (RITALIN) 10 MG tablet Take 10 mg by mouth daily as needed.    . montelukast (SINGULAIR) 10 MG tablet Take 10 mg by mouth at bedtime.    Marland Kitchen oxyCODONE-acetaminophen (PERCOCET/ROXICET) 5-325 MG per tablet Take 2 tablets by mouth daily as needed for moderate pain or severe pain (mighraine.).     Marland Kitchen polyethylene glycol (MIRALAX / GLYCOLAX) packet Take 17 g by mouth daily as needed for mild constipation.     Marland Kitchen PRAVASTATIN SODIUM PO Take 40 mg by mouth.     . pregabalin (LYRICA) 50 MG capsule Take 50 mg by mouth 3 (three) times daily.    Marland Kitchen PROLIA 60 MG/ML SOSY injection BRING TO THE OFFICE FOR INJECTION ON 02/15/2018 AS DIRECTED ONCE EVERY 6 MONTHS    . promethazine (PHENERGAN) 25 MG tablet Take 25 mg by mouth every 6 (six) hours as needed for nausea.     . traZODone (DESYREL) 50 MG tablet TAKE 1 TABLET(50 MG) BY MOUTH AT BEDTIME 90 tablet 0  . Ubrogepant (UBRELVY) 50 MG TABS Take 50-100 mg by mouth daily as needed.    Marland Kitchen VASCEPA 1 g CAPS TK 2 CS PO BID WC  3  . VITAMIN D PO Take by mouth. Take 5047m daily    . VOLTAREN 1 % GEL Apply 2 g topically 4 (four) times daily as needed (JOINT PAIN).   3   No current facility-administered medications for this visit.    Medication Side Effects: None  Allergies:  Allergies  Allergen Reactions  . Sulfa Antibiotics Nausea And Vomiting    Causes pt to vomit blood  . Gluten Meal Other (See Comments)    Sensitivity.    . Lactose Intolerance (Gi) Nausea And Vomiting  . Flagyl [Metronidazole Hcl] Nausea And Vomiting  . Morphine And Related Other (See Comments)    Reaction: Depression, emotional    Past Medical History:  Diagnosis Date  .  Anemia   . Anxiety   . Arthritis    osteoarthritis  . ASCUS (atypical squamous cells of undetermined significance) on Pap smear 05/06/05   NEG HIGH RISK HPV--C&B BIOPSY BENIGN 12/2005  . Asthma   . Bipolar 2 disorder ([place])   . Cancer ([place])    skin cancer - basal cell  . Colon polyps   . Complication of anesthesia    anxious afterwards, will get headaches   . Constipation   . Depression   . Fibromyalgia 10/2013  . GERD (gastroesophageal reflux disease)   . 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 Heart murmur    never had any problems  . Hemorrhoids   . High cholesterol   . 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   cytology negative  . IBS (irritable bowel syndrome)   . Insomnia   . Lymphocytic colitis   . Lymphoma ([place])   . 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 Migraines   . Osteoarthritis   . Osteopenia   . Peripheral neuropathy   . PTSD (post-traumatic stress disorder)     Family History  Problem Relation Age of Onset  . Diabetes Father   . Hypertension Father   . Hyperlipidemia Father   . Heart disease Maternal Grandfather   . Heart disease Paternal Grandmother   . Heart disease Paternal Grandfather   . Depression Son   . Depression Son   . Anxiety disorder Son   . Depression Daughter   . Anxiety disorder Daughter   . Asthma Daughter     Social History   Socioeconomic History  . Marital status: Divorced    Spouse name: Not on file  . Number of children: 3  . Years of education: Not on file  . Highest education level: Not on file  Occupational History  . Not on file  Tobacco Use  . Smoking status: Former Smoker    Types: Cigarettes    Quit date: 05/13/2014     Years since quitting: 5.6  . Smokeless tobacco: Never Used  . Tobacco comment: social   Vaping Use  . Vaping Use: Never used  Substance and Sexual Activity  . Alcohol use: Yes    Alcohol/week: 0.0 standard drinks    Comment: rare  . Drug use: Never  . Sexual activity: Not Currently    Comment: -1st intercourse 59 yo-More than 5 partners  Other Topics Concern  . Not on file  Social History Narrative  . Not on file   Social Determinants of Health   Financial Resource Strain:   . Difficulty of Paying Living Expenses: Not on file  Food Insecurity:   . Worried About Charity fundraiser in the Last Year: Not on file  . Ran Out of Food in the Last Year: Not on file  Transportation Needs:   . Lack of Transportation (Medical): Not on file  . Lack of Transportation (Non-Medical): Not on file  Physical Activity:   . Days of Exercise per Week: Not on file  . Minutes of Exercise per Session: Not on file  Stress:   . Feeling of Stress : Not on file  Social Connections:   . Frequency of Communication with Friends and Family: Not on file  . Frequency of Social Gatherings with Friends and Family: Not on file  . Attends Religious Services: Not on file  . Active Member of Clubs or Organizations: Not  on file  . Attends Archivist Meetings: Not on file  . Marital Status: Not on file  Intimate Partner Violence:   . Fear of Current or Ex-Partner: Not on file  . Emotionally Abused: Not on file  . Physically Abused: Not on file  . Sexually Abused: Not on file    Past Medical History, Surgical history, Social history, and Family history were reviewed and updated as appropriate.   Please see review of systems for further details on the patient's review from today.   Objective:   Physical Exam:  LMP 12/28/2010   Physical Exam Constitutional:      General: She is not in acute distress. Musculoskeletal:        General: No deformity.  Neurological:     Mental Status: She is  alert and oriented to person, place, and time.     Cranial Nerves: No dysarthria.     Coordination: Coordination normal.  Psychiatric:        Attention and Perception: Attention and perception normal. She does not perceive auditory or visual hallucinations.        Mood and Affect: Mood is anxious. Mood is not depressed. Affect is not labile, blunt, angry or inappropriate.        Speech: Speech normal.        Behavior: Behavior normal. Behavior is cooperative.        Thought Content: Thought content normal. Thought content is not paranoid or delusional. Thought content does not include homicidal or suicidal ideation. Thought content does not include homicidal or suicidal plan.        Cognition and Memory: Cognition and memory normal.        Judgment: Judgment normal.     Comments: Insight intact Depression resolved with fluvoxamine 25 mg Mood worsen when health problems worsen. Lately anxiety generally better.     Lab Review:     Component Value Date/Time   NA 142 08/01/2016 1713   NA 144 07/12/2015 1521   K 4.7 08/01/2016 1713   K 3.8 07/12/2015 1521   CL 107 08/01/2016 1713   CO2 23 08/01/2016 1713   CO2 27 07/12/2015 1521   GLUCOSE 95 08/01/2016 1713   GLUCOSE 82 07/12/2015 1521   BUN 14 08/01/2016 1713   BUN 10.3 07/12/2015 1521   CREATININE 0.87 08/01/2016 1713   CREATININE 0.8 07/12/2015 1521   CALCIUM 10.0 08/01/2016 1713   CALCIUM 9.9 07/12/2015 1521   PROT 7.4 08/01/2016 1713   PROT 7.4 07/12/2015 1521   ALBUMIN 4.8 08/01/2016 1713   ALBUMIN 4.4 07/12/2015 1521   AST 23 08/01/2016 1713   AST 22 07/12/2015 1521   ALT 26 08/01/2016 1713   ALT 31 07/12/2015 1521   ALKPHOS 55 08/01/2016 1713   ALKPHOS 54 07/12/2015 1521   BILITOT 0.7 08/01/2016 1713   BILITOT 0.40 07/12/2015 1521   GFRNONAA 76 08/01/2016 1713   GFRAA 88 08/01/2016 1713       Component Value Date/Time   WBC 5.9 08/01/2016 1713   RBC 4.71 08/01/2016 1713   HGB 14.6 08/01/2016 1713   HGB 14.1  07/12/2015 1521   HCT 43.1 08/01/2016 1713   HCT 41.5 07/12/2015 1521   PLT 254 08/01/2016 1713   PLT 248 07/12/2015 1521   MCV 91.5 08/01/2016 1713   MCV 92.2 07/12/2015 1521   MCH 31.0 08/01/2016 1713   MCHC 33.9 08/01/2016 1713   RDW 13.2 08/01/2016 1713   RDW 13.3 07/12/2015 1521  LYMPHSABS 2,006 08/01/2016 1713   LYMPHSABS 1.8 07/12/2015 1521   MONOABS 354 08/01/2016 1713   MONOABS 0.7 07/12/2015 1521   EOSABS 118 08/01/2016 1713   EOSABS 0.1 07/12/2015 1521   BASOSABS 0 08/01/2016 1713   BASOSABS 0.0 07/12/2015 1521    No results found for: POCLITH, LITHIUM   No results found for: PHENYTOIN, PHENOBARB, VALPROATE, CBMZ   .res Assessment: Plan:    Nashika was seen today for follow-up, anxiety and adhd.  Diagnoses and all orders for this visit:  Generalized anxiety disorder  Attention deficit hyperactivity disorder (ADHD), combined type  PTSD (post-traumatic stress disorder)  Major depressive disorder, recurrent episode, moderate (HCC)  Panic disorder with agoraphobia  Mixed obsessional thoughts and acts  Bipolar II disorder ([place])  Rediscussed dx issues bc more prominent PTSD obvious has called into doubt the bipolar dix bc she's remained mood stable off the lamotrigine.  Need more time to reevaluate this and disc risk mania and mood cycling.  Discussed continued risk of hypomanic symptoms.  She has had symptoms consistent with hypomania in the past but there is now some question as to whether they were PTSD related rather than truly bipolar 2.  Her son has had treatment resistant depression with possible bipolar disorder and her daughter has been diagnosed with bipolar disorder but also a history of borderline personality disorder.  So there is a family history of the spectrum.  started Luvox again for it and for the anti-inflammatory potential for her health.  Has seen some benefit.   Prior benefit at 25 mg daily.  Optiion increase but slightly bc med  sensitive. AGREE increase fluvoxamine to 37.5 mg daily once HA is controlled for several weeks. Clear benefit from fluvoxamine for mood quite dramatically. No manic sx. Disc anti-inflammatory effects of fluvoxamine at doses of 100 mg BID for Covid.  Disc recent panic in ER when asked the staff not to start IV in arm and they didn't listen and blew 2 veins.  Stress management for mood sx. Health problems worsen mood episodically.    Answered questions about Lyrica and it's off lable use for anxiety and it's on label use for pain.  Pain contributor for depression.   We discussed the short-term risks associated with benzodiazepines including sedation and increased fall risk among others.  Discussed long-term side effect risk including dependence, potential withdrawal symptoms, and the potential eventual dose-related risk of dementia.  Keep this at a minimum given cognitive concerns.  She only uses it when triggered with anxiety usually medical.  She is not using it regularly.  Discussed potential benefits, risks, and side effects of stimulants with patient to include increased heart rate, palpitations, insomnia, increased anxiety, increased irritability, or decreased appetite.  Instructed patient to contact office if experiencing any significant tolerability issues. Option Ritalin 5 BID or 10 mg AM for cognitive and ADD reasons and disc risk of palpitations.  Continue therapy.  She is working well on things. Not able to see therapist as often as desired.    Greater than 50% of 30 mins of webex face to face time with patient was spent on counseling and coordination of care.   FU 3 mos  Lynder Parents, MD, DFAPA    Please see After Visit Summary for patient specific instructions.  Future Appointments  Date Time Provider [place]  01/09/2020 10:15 AM Desenglau, Tommy Rainwater, PT [facility]  01/14/2020 11:00 AM Desenglau, Tommy Rainwater, PT [facility]  01/19/2020 11:00 AM  Beuhring,  Alene Mires, PT [facility]  01/26/2020 11:00 AM Beuhring, Alene Mires, PT [facility]  02/02/2020  2:45 PM Desenglau, Tommy Rainwater, PT [facility]  02/09/2020 11:00 AM Desenglau, Tommy Rainwater, PT [facility]  02/16/2020  2:45 PM Desenglau, Tommy Rainwater, PT [facility]  02/23/2020  2:00 PM Desenglau, Tommy Rainwater, PT [facility]  06/29/2020  1:15 PM Deveshwar, Abel Presto, MD [facility] None    No orders of the defined types were placed in this encounter.     -------------------------------

## 2020-01-05 DIAGNOSIS — F3181 Bipolar II disorder: Secondary | ICD-10-CM | POA: Diagnosis not present

## 2020-01-09 ENCOUNTER — Encounter: Payer: Self-pay | Admitting: Physical Therapy

## 2020-01-09 ENCOUNTER — Ambulatory Visit: Payer: Medicare Other | Admitting: Physical Therapy

## 2020-01-09 ENCOUNTER — Other Ambulatory Visit: Payer: Self-pay

## 2020-01-09 DIAGNOSIS — R252 Cramp and spasm: Secondary | ICD-10-CM

## 2020-01-09 DIAGNOSIS — M79672 Pain in left foot: Secondary | ICD-10-CM | POA: Diagnosis not present

## 2020-01-09 DIAGNOSIS — J3081 Allergic rhinitis due to animal (cat) (dog) hair and dander: Secondary | ICD-10-CM | POA: Diagnosis not present

## 2020-01-09 DIAGNOSIS — M6281 Muscle weakness (generalized): Secondary | ICD-10-CM | POA: Diagnosis not present

## 2020-01-09 DIAGNOSIS — J301 Allergic rhinitis due to pollen: Secondary | ICD-10-CM | POA: Diagnosis not present

## 2020-01-09 DIAGNOSIS — J3089 Other allergic rhinitis: Secondary | ICD-10-CM | POA: Diagnosis not present

## 2020-01-09 DIAGNOSIS — M79671 Pain in right foot: Secondary | ICD-10-CM | POA: Diagnosis not present

## 2020-01-09 NOTE — Therapy (Addendum)
[facility] [address], [place], [place], [address] Phone: [phone]   Fax:  [phone]  Physical Therapy Treatment  Patient Details  Name: Danielle Bruce MRN: [id number] Date of Birth: Jan 21, 1961 Referring Provider (PT): Mayra Neer, MD   Encounter Date: 01/09/2020   PT End of Session - 01/09/20 1147    Visit Number 3    Date for PT Re-Evaluation 03/16/20    Authorization Type Medicare/Medicaid    PT Start Time 1028    PT Stop Time 1108    PT Time Calculation (min) 40 min    Activity Tolerance Patient tolerated treatment well;Patient limited by pain    Behavior During Therapy [facility] for tasks assessed/performed           Past Medical History:  Diagnosis Date  . Anemia   . Anxiety   . Arthritis    osteoarthritis  . ASCUS (atypical squamous cells of undetermined significance) on Pap smear 05/06/05   NEG HIGH RISK HPV--C&B BIOPSY BENIGN 12/2005  . Asthma   . Bipolar 2 disorder ([place])   . Cancer ([place])    skin cancer - basal cell  . Colon polyps   . Complication of anesthesia    anxious afterwards, will get headaches   . Constipation   . Depression   . Fibromyalgia 10/2013  . GERD (gastroesophageal reflux disease)   . 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 Heart murmur    never had any problems  . Hemorrhoids   . High cholesterol   . 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   cytology negative  . IBS (irritable bowel syndrome)   . Insomnia   . Lymphocytic colitis   . Lymphoma ([place])   . 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 Migraines   . Osteoarthritis   . Osteopenia   . Peripheral neuropathy   . PTSD (post-traumatic stress disorder)     Past Surgical History:  Procedure Laterality Date  . BONE MARROW BIOPSY Left 12/18/2013   Plasma cell dyscrasia 8% population of plasma cells  . BREAST BIOPSY Right    benign stereo   . CESAREAN SECTION  [phone]  . CHOLECYSTECTOMY N/A 10/16/2012   Procedure: LAPAROSCOPIC CHOLECYSTECTOMY;  Surgeon: Joyice Faster. Cornett, MD;  Location: [facility];  Service: General;  Laterality: N/A;  . COLONOSCOPY     numerous times  . DILATION AND CURETTAGE OF UTERUS    . ESOPHAGOGASTRODUODENOSCOPY    . [place]   x3  . IUD REMOVAL  02/2015   Mirena  . LAPAROSCOPIC LYSIS OF ADHESIONS N/A 10/16/2012   Procedure: LAPAROSCOPIC LYSIS OF ADHESIONS;  Surgeon: Joyice Faster. Cornett, MD;  [facility];  Service: General;  Laterality: N/A;  . LAPAROSCOPY N/A 10/16/2012   Procedure: LAPAROSCOPY DIAGNOSTIC;  Surgeon: Joyice Faster. Cornett, MD;  [facility];  Service: General;  Laterality: N/A;  . PELVIC LAPAROSCOPY    . RADIOLOGY WITH ANESTHESIA N/A 12/16/2015   Procedure: MRI OF BRAIN WITH AND WITHOUT CONTRAST;  Surgeon: Medication Radiologist, MD;  Location: [place];  Service: Radiology;  Laterality: N/A;  . SHOULDER SURGERY  2007/2008  . SPINE SURGERY  2010   cervical    There were no vitals filed for this visit.   Subjective Assessment - 01/11/20 1650    Subjective Back and shoulders are feeling better.  My feet are the only thing bothering  me today when walking.  Denies pain at rest    Currently in Pain? No/denies                             [facility] Adult PT Treatment/Exercise - 01/11/20 0001      Manual Therapy   Myofascial Release bilat calves and plantar surface of the feet; light effleurage around the ankles for reduced swelling      Ankle Exercises: Seated   Towel Crunch 5 reps    Other Seated Ankle Exercises toe marble pick ups 20x each                    PT Short Term Goals - 12/24/19 1524      PT SHORT TERM GOAL #1   Title be independent in initial HEP    Time 4    Period Weeks    Status New    Target Date 01/20/20      PT SHORT TERM GOAL #2   Title FOTO captured next visit    Time 1    Period Weeks    Status New    Target  Date 12/30/19             PT Long Term Goals - 12/24/19 1522      PT LONG TERM GOAL #1   Title be independent in advanced HEP    Time 12    Period Weeks    Target Date 03/16/20      PT LONG TERM GOAL #2   Title Able to walk around her home without wearing shoes or only wearing house shoes    Time 12    Period Weeks    Status New    Target Date 03/16/20      PT LONG TERM GOAL #3   Title report 75% less neck pain throughout the day with normal functional activities    Time 12    Period Weeks    Status New    Target Date 03/16/20      PT LONG TERM GOAL #4   Title able to go for 2 mile walk at least 3x/week for exercise    Time 12    Period Weeks    Status New    Target Date 03/16/20      PT LONG TERM GOAL #5   Title ankle strength 5/5 throughout in bilateral feet for improved stability during single leg activities to reduce risk of falls    Time 12    Period Weeks    Status New    Target Date 03/16/20                 Plan - 01/11/20 1638    Clinical Impression Statement Pt did well with treatment today. She was walking better after doing the towel scrunches and toe pick ups without any antalgic gait. After STM there was less pain but she did have increased antalgia when she stood to walk immediately afterwards.  Pt will add exercises given during today's treatment.  She will continue to benefit from skilled PT to work on gradual strengthening and myofascial realease for pain management.    PT Treatment/Interventions ADLs/Self Care Home Management;Cryotherapy;Electrical Stimulation;Iontophoresis 75m/ml Dexamethasone;Moist Heat;Ultrasound;Neuromuscular re-education;Therapeutic exercise;Therapeutic activities;Patient/family education;Manual techniques;Dry needling;Passive range of motion;Taping    PT Next Visit Plan towel scrunches; MFR; ktape for arch support; isometric ankle movement    PT Home Exercise Plan Access Code:  [email or web address]    Consulted and Agree with Plan  of Care Patient           Patient will benefit from skilled therapeutic intervention in order to improve the following deficits and impairments:  Abnormal gait, Increased fascial restricitons, Difficulty walking, Decreased range of motion, Pain, Decreased strength, Postural dysfunction  Visit Diagnosis: Pain in right foot  Pain in left foot  Muscle weakness (generalized)  Cramp and spasm     Problem List Patient Active Problem List   Diagnosis Date Noted  . GAD (generalized anxiety disorder) 12/26/2017  . OCD (obsessive compulsive disorder) 12/26/2017  . PTSD (post-traumatic stress disorder) 12/26/2017  . DDD (degenerative disc disease), cervical 07/14/2016  . Primary osteoarthritis of both feet 07/14/2016  . Primary osteoarthritis of both hands 07/14/2016  . Other fatigue 07/14/2016  . History of IBS 07/14/2016  . Osteopenia of multiple sites 07/14/2016  . Fibromyalgia 01/13/2016  . MGUS (monoclonal gammopathy of unknown significance) 12/23/2013  . Chronic cholecystitis without calculus 10/18/2012  . Abdominal pain, unspecified site 10/18/2012  . Nausea alone 10/18/2012  . Unspecified constipation 10/18/2012  . Depression 10/18/2012  . Anxiety   . ASCUS (atypical squamous cells of undetermined significance) on Pap smear   . IUD   . Hemorrhoids 11/29/2010  . Abdominal pain, left upper quadrant 11/29/2010    Jule Ser, PT 01/11/2020, 4:51 PM  [facility] [address] [place], [place], [address] Phone: [phone]   Fax:  [phone]  Name: Danielle Bruce MRN: [id number] Date of Birth: 1960-10-22

## 2020-01-14 ENCOUNTER — Other Ambulatory Visit: Payer: Self-pay

## 2020-01-14 ENCOUNTER — Ambulatory Visit: Payer: Medicare Other | Admitting: Physical Therapy

## 2020-01-14 ENCOUNTER — Encounter: Payer: Self-pay | Admitting: Physical Therapy

## 2020-01-14 DIAGNOSIS — M79672 Pain in left foot: Secondary | ICD-10-CM | POA: Diagnosis not present

## 2020-01-14 DIAGNOSIS — M79671 Pain in right foot: Secondary | ICD-10-CM

## 2020-01-14 DIAGNOSIS — M6281 Muscle weakness (generalized): Secondary | ICD-10-CM | POA: Diagnosis not present

## 2020-01-14 DIAGNOSIS — J3089 Other allergic rhinitis: Secondary | ICD-10-CM | POA: Diagnosis not present

## 2020-01-14 DIAGNOSIS — R252 Cramp and spasm: Secondary | ICD-10-CM | POA: Diagnosis not present

## 2020-01-14 DIAGNOSIS — J301 Allergic rhinitis due to pollen: Secondary | ICD-10-CM | POA: Diagnosis not present

## 2020-01-14 DIAGNOSIS — J3081 Allergic rhinitis due to animal (cat) (dog) hair and dander: Secondary | ICD-10-CM | POA: Diagnosis not present

## 2020-01-14 NOTE — Therapy (Signed)
[facility] [address] [place], [place], [address] Phone: [phone]   Fax:  [phone]  Physical Therapy Treatment  Patient Details  Name: Danielle Bruce MRN: [id number] Date of Birth: 04-28-1960 Referring Provider (PT): Mayra Neer, MD   Encounter Date: 01/14/2020   PT End of Session - 01/14/20 1117    Visit Number 4    Date for PT Re-Evaluation 03/16/20    Authorization Type Medicare/Medicaid    PT Start Time 1102    PT Stop Time 1142    PT Time Calculation (min) 40 min    Activity Tolerance Patient tolerated treatment well;Patient limited by pain    Behavior During Therapy [facility] for tasks assessed/performed           Past Medical History:  Diagnosis Date  . Anemia   . Anxiety   . Arthritis    osteoarthritis  . ASCUS (atypical squamous cells of undetermined significance) on Pap smear 05/06/05   NEG HIGH RISK HPV--C&B BIOPSY BENIGN 12/2005  . Asthma   . Bipolar 2 disorder ([place])   . Cancer ([place])    skin cancer - basal cell  . Colon polyps   . Complication of anesthesia    anxious afterwards, will get headaches   . Constipation   . Depression   . Fibromyalgia 10/2013  . GERD (gastroesophageal reflux disease)   . 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 Heart murmur    never had any problems  . Hemorrhoids   . High cholesterol   . 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   cytology negative  . IBS (irritable bowel syndrome)   . Insomnia   . Lymphocytic colitis   . Lymphoma ([place])   . 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 Migraines   . Osteoarthritis   . Osteopenia   . Peripheral neuropathy   . PTSD (post-traumatic stress disorder)     Past Surgical History:  Procedure Laterality Date  . BONE MARROW BIOPSY Left 12/18/2013   Plasma cell dyscrasia 8% population of plasma cells  . BREAST BIOPSY Right    benign stereo   . CESAREAN SECTION  [phone]  . CHOLECYSTECTOMY N/A 10/16/2012   Procedure: LAPAROSCOPIC CHOLECYSTECTOMY;  Surgeon: Joyice Faster. Cornett, MD;  [facility];  Service: General;  Laterality: N/A;  . COLONOSCOPY     numerous times  . DILATION AND CURETTAGE OF UTERUS    . ESOPHAGOGASTRODUODENOSCOPY    . [place]   x3  . IUD REMOVAL  02/2015   Mirena  . LAPAROSCOPIC LYSIS OF ADHESIONS N/A 10/16/2012   Procedure: LAPAROSCOPIC LYSIS OF ADHESIONS;  Surgeon: Joyice Faster. Cornett, MD;  [facility];  Service: General;  Laterality: N/A;  . LAPAROSCOPY N/A 10/16/2012   Procedure: LAPAROSCOPY DIAGNOSTIC;  Surgeon: Joyice Faster. Cornett, MD;  [facility];  Service: General;  Laterality: N/A;  . PELVIC LAPAROSCOPY    . RADIOLOGY WITH ANESTHESIA N/A 12/16/2015   Procedure: MRI OF BRAIN WITH AND WITHOUT CONTRAST;  Surgeon: Medication Radiologist, MD;  Location: [place];  Service: Radiology;  Laterality: N/A;  . SHOULDER SURGERY  2007/2008  . SPINE SURGERY  2010   cervical    There were no vitals filed for this visit.   Subjective Assessment - 01/14/20 1109    Subjective My feet are sore today.  The top of the right foot "is like  fire".  Both feet around the lateral malleolus.    Pertinent History goes by "[name]" ; OA bilateral feet; fibromyalgia; off cycle of steriod medicine currenlty    Limitations Standing;Walking;House hold activities    Patient Stated Goals be able to walk barefoot in house and reduce pain in feet; able to go for walks    Currently in Pain? Yes    Pain Score 3     Pain Location Foot    Pain Orientation Right;Left    Pain Descriptors / Indicators Aching    Pain Type Chronic pain    Pain Onset More than a month ago    Pain Frequency Intermittent    Multiple Pain Sites No                             [facility] Adult PT Treatment/Exercise - 01/14/20 0001      Exercises   Exercises Ankle      Manual Therapy   Manual Therapy Myofascial  release;Soft tissue mobilization    Soft tissue mobilization plantaris, posterior tib, peroneals    Myofascial Release plantaris, posterior tib, peroneals      Ankle Exercises: Seated   Towel Crunch 5 reps    Heel Raises Limitations rocker board PF/DF    Other Seated Ankle Exercises inversion and eversion isometric - 20x                    PT Short Term Goals - 12/24/19 1524      PT SHORT TERM GOAL #1   Title be independent in initial HEP    Time 4    Period Weeks    Status New    Target Date 01/20/20      PT SHORT TERM GOAL #2   Title FOTO captured next visit    Time 1    Period Weeks    Status New    Target Date 12/30/19             PT Long Term Goals - 12/24/19 1522      PT LONG TERM GOAL #1   Title be independent in advanced HEP    Time 12    Period Weeks    Target Date 03/16/20      PT LONG TERM GOAL #2   Title Able to walk around her home without wearing shoes or only wearing house shoes    Time 12    Period Weeks    Status New    Target Date 03/16/20      PT LONG TERM GOAL #3   Title report 75% less neck pain throughout the day with normal functional activities    Time 12    Period Weeks    Status New    Target Date 03/16/20      PT LONG TERM GOAL #4   Title able to go for 2 mile walk at least 3x/week for exercise    Time 12    Period Weeks    Status New    Target Date 03/16/20      PT LONG TERM GOAL #5   Title ankle strength 5/5 throughout in bilateral feet for improved stability during single leg activities to reduce risk of falls    Time 12    Period Weeks    Status New    Target Date 03/16/20  Plan - 01/14/20 1146    Clinical Impression Statement Pt responded well to today's treatment focused on gradually increasing difficulty of ankle and foot strengthening exercises and STM for pain management.  PT applied Ktape for arch support as she had a lot of tension in plantaris muscles.  Pt was able to tolerate  more pressure during manual treatment today and moved past fascial layer to gentle soft tissue mobs.  Pt did not have as much pain after getting up from lying down at the end of the session today.  Pt will benefit from skilled PT to continue wokring on strength and immproved muscle pliability for ability to return to regular walking routine.    PT Treatment/Interventions ADLs/Self Care Home Management;Cryotherapy;Electrical Stimulation;Iontophoresis 30m/ml Dexamethasone;Moist Heat;Ultrasound;Neuromuscular re-education;Therapeutic exercise;Therapeutic activities;Patient/family education;Manual techniques;Dry needling;Passive range of motion;Taping    PT Next Visit Plan progress ankle ex as able; BAPS, band, weight shifting on foam/rebounder; STM to plantaris, posterior tib, peroneals, gastroc/soleus           Patient will benefit from skilled therapeutic intervention in order to improve the following deficits and impairments:  Abnormal gait, Increased fascial restricitons, Difficulty walking, Decreased range of motion, Pain, Decreased strength, Postural dysfunction  Visit Diagnosis: Pain in right foot  Pain in left foot  Muscle weakness (generalized)  Cramp and spasm     Problem List Patient Active Problem List   Diagnosis Date Noted  . GAD (generalized anxiety disorder) 12/26/2017  . OCD (obsessive compulsive disorder) 12/26/2017  . PTSD (post-traumatic stress disorder) 12/26/2017  . DDD (degenerative disc disease), cervical 07/14/2016  . Primary osteoarthritis of both feet 07/14/2016  . Primary osteoarthritis of both hands 07/14/2016  . Other fatigue 07/14/2016  . History of IBS 07/14/2016  . Osteopenia of multiple sites 07/14/2016  . Fibromyalgia 01/13/2016  . MGUS (monoclonal gammopathy of unknown significance) 12/23/2013  . Chronic cholecystitis without calculus 10/18/2012  . Abdominal pain, unspecified site 10/18/2012  . Nausea alone 10/18/2012  . Unspecified constipation  10/18/2012  . Depression 10/18/2012  . Anxiety   . ASCUS (atypical squamous cells of undetermined significance) on Pap smear   . IUD   . Hemorrhoids 11/29/2010  . Abdominal pain, left upper quadrant 11/29/2010    JJule Ser PT 01/14/2020, 1:59 PM  [facility] [address] [place] [address]: [phone]  Fax:  [phone] Name: CDacey MilbergerMRN: [id number] of Birth: 908/25/1962

## 2020-01-19 ENCOUNTER — Encounter: Payer: Medicare Other | Admitting: Physical Therapy

## 2020-01-26 ENCOUNTER — Encounter: Payer: Medicare Other | Admitting: Physical Therapy

## 2020-01-27 DIAGNOSIS — F3181 Bipolar II disorder: Secondary | ICD-10-CM | POA: Diagnosis not present

## 2020-02-02 ENCOUNTER — Encounter: Payer: Medicare Other | Admitting: Physical Therapy

## 2020-02-04 DIAGNOSIS — M797 Fibromyalgia: Secondary | ICD-10-CM | POA: Diagnosis not present

## 2020-02-04 DIAGNOSIS — F3181 Bipolar II disorder: Secondary | ICD-10-CM | POA: Diagnosis not present

## 2020-02-04 DIAGNOSIS — F4310 Post-traumatic stress disorder, unspecified: Secondary | ICD-10-CM | POA: Diagnosis not present

## 2020-02-04 DIAGNOSIS — E7800 Pure hypercholesterolemia, unspecified: Secondary | ICD-10-CM | POA: Diagnosis not present

## 2020-02-04 DIAGNOSIS — K5289 Other specified noninfective gastroenteritis and colitis: Secondary | ICD-10-CM | POA: Diagnosis not present

## 2020-02-04 DIAGNOSIS — Z0000 Encounter for general adult medical examination without abnormal findings: Secondary | ICD-10-CM | POA: Diagnosis not present

## 2020-02-04 DIAGNOSIS — K589 Irritable bowel syndrome without diarrhea: Secondary | ICD-10-CM | POA: Diagnosis not present

## 2020-02-04 DIAGNOSIS — G629 Polyneuropathy, unspecified: Secondary | ICD-10-CM | POA: Diagnosis not present

## 2020-02-04 DIAGNOSIS — G43909 Migraine, unspecified, not intractable, without status migrainosus: Secondary | ICD-10-CM | POA: Diagnosis not present

## 2020-02-04 DIAGNOSIS — F900 Attention-deficit hyperactivity disorder, predominantly inattentive type: Secondary | ICD-10-CM | POA: Diagnosis not present

## 2020-02-04 DIAGNOSIS — D472 Monoclonal gammopathy: Secondary | ICD-10-CM | POA: Diagnosis not present

## 2020-02-04 DIAGNOSIS — J45909 Unspecified asthma, uncomplicated: Secondary | ICD-10-CM | POA: Diagnosis not present

## 2020-02-09 ENCOUNTER — Ambulatory Visit: Payer: Medicare Other | Attending: Family Medicine | Admitting: Physical Therapy

## 2020-02-09 ENCOUNTER — Other Ambulatory Visit: Payer: Self-pay

## 2020-02-09 ENCOUNTER — Encounter: Payer: Self-pay | Admitting: Physical Therapy

## 2020-02-09 DIAGNOSIS — M6281 Muscle weakness (generalized): Secondary | ICD-10-CM | POA: Diagnosis not present

## 2020-02-09 DIAGNOSIS — M79671 Pain in right foot: Secondary | ICD-10-CM | POA: Diagnosis not present

## 2020-02-09 DIAGNOSIS — M79672 Pain in left foot: Secondary | ICD-10-CM | POA: Diagnosis not present

## 2020-02-09 DIAGNOSIS — R252 Cramp and spasm: Secondary | ICD-10-CM

## 2020-02-09 NOTE — Therapy (Signed)
[facility] [address], [address] [place], [place], [address] Phone: [phone]   Fax:  [phone]  Physical Therapy Treatment  Patient Details  Name: Danielle Bruce MRN: [id number] Date of Birth: 02/28/1960 Referring Provider (PT): Shaw, Kimberlee, MD   Encounter Date: 02/09/2020   PT End of Session - 02/09/20 1113    Visit Number 5    Date for PT Re-Evaluation 03/16/20    Authorization Type Medicare/Medicaid    PT Start Time 1104    PT Stop Time 1143    PT Time Calculation (min) 39 min    Activity Tolerance Patient tolerated treatment well;Patient limited by pain    Behavior During Therapy [facility] for tasks assessed/performed           Past Medical History:  Diagnosis Date  . Anemia   . Anxiety   . Arthritis    osteoarthritis  . ASCUS (atypical squamous cells of undetermined significance) on Pap smear 05/06/05   NEG HIGH RISK HPV--C&B BIOPSY BENIGN 12/2005  . Asthma   . Bipolar 2 disorder ([facility])   . Cancer ([facility])    skin cancer - basal cell  . Colon polyps   . Complication of anesthesia    anxious afterwards, will get headaches   . Constipation   . Depression   . Fibromyalgia 10/2013  . GERD (gastroesophageal reflux disease)   . 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 Heart murmur    never had any problems  . Hemorrhoids   . High cholesterol   . 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   cytology negative  . IBS (irritable bowel syndrome)   . Insomnia   . Lymphocytic colitis   . Lymphoma ([facility])   . 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 Migraines   . Osteoarthritis   . Osteopenia   . Peripheral neuropathy   . PTSD (post-traumatic stress disorder)     Past Surgical History:  Procedure Laterality Date  . BONE MARROW BIOPSY Left 12/18/2013   Plasma cell dyscrasia 8% population of plasma cells  . BREAST BIOPSY Right    benign stereo   . CESAREAN SECTION  86,88,93  . CHOLECYSTECTOMY N/A 10/16/2012   Procedure: LAPAROSCOPIC CHOLECYSTECTOMY;  Surgeon: Thomas A. Cornett, MD;  [facility];  Service: General;  Laterality: N/A;  . COLONOSCOPY     numerous times  . DILATION AND CURETTAGE OF UTERUS    . ESOPHAGOGASTRODUODENOSCOPY    . HEMORRHOID SURGERY  1993   x3  . IUD REMOVAL  02/2015   Mirena  . LAPAROSCOPIC LYSIS OF ADHESIONS N/A 10/16/2012   Procedure: LAPAROSCOPIC LYSIS OF ADHESIONS;  Surgeon: Thomas A. Cornett, MD;  [facility];  Service: General;  Laterality: N/A;  . LAPAROSCOPY N/A 10/16/2012   Procedure: LAPAROSCOPY DIAGNOSTIC;  Surgeon: Thomas A. Cornett, MD;  [facility];  Service: General;  Laterality: N/A;  . PELVIC LAPAROSCOPY    . RADIOLOGY WITH ANESTHESIA N/A 12/16/2015   Procedure: MRI OF BRAIN WITH AND WITHOUT CONTRAST;  Surgeon: Medication Radiologist, MD;  [facility];  Service: Radiology;  Laterality: N/A;  . SHOULDER SURGERY  2007/2008  . SPINE SURGERY  2010   cervical    There were no vitals filed for this visit.   Subjective Assessment - 02/09/20 1106    Subjective Pt states she has been doing a lot and is hurting all over.    Pt states the top of the Rt foot hurts more with driving.  The pain around the sides of the feet is much better.  Pain is also having electric feeling pain at lower achilles.    Pertinent History goes by "Danielle Bruce" ; OA bilateral feet; fibromyalgia; off cycle of steriod medicine currenlty    Currently in Pain? Yes    Pain Score 3     Pain Location Foot    Pain Orientation Right;Left    Pain Descriptors / Indicators Shooting;Sore    Pain Type Chronic pain    Pain Radiating Towards heel around the achilles    Pain Onset More than a month ago    Aggravating Factors  walking and moving    Pain Relieving Factors lyrica, resting    Multiple Pain Sites No                             [facility] Adult PT Treatment/Exercise - 02/09/20 0001       Manual Therapy   Manual Therapy Taping    Kinesiotex Facilitate Muscle      Kinesiotix   Facilitate Muscle  plantaris, post tib, achilles bilateral      Ankle Exercises: Standing   Other Standing Ankle Exercises heel/toe raises on foam mat - 20x      Ankle Exercises: Seated   Heel Raises Limitations rocker board PF/DF    BAPS Sitting   rocker board circle - 20x each way     Ankle Exercises: Stretches   Soleus Stretch 3 reps;20 seconds    Gastroc Stretch 3 reps;20 seconds                    PT Short Term Goals - 02/09/20 1232      PT SHORT TERM GOAL #1   Title be independent in initial HEP    Status Achieved      PT SHORT TERM GOAL #2   Baseline able to do band exercises without pain or spasms    Status On-going             PT Long Term Goals - 12/24/19 1522      PT LONG TERM GOAL #1   Title be independent in advanced HEP    Time 12    Period Weeks    Target Date 03/16/20      PT LONG TERM GOAL #2   Title Able to walk around her home without wearing shoes or only wearing house shoes    Time 12    Period Weeks    Status New    Target Date 03/16/20      PT LONG TERM GOAL #3   Title report 75% less neck pain throughout the day with normal functional activities    Time 12    Period Weeks    Status New    Target Date 03/16/20      PT LONG TERM GOAL #4   Title able to go for 2 mile walk at least 3x/week for exercise    Time 12    Period Weeks    Status New    Target Date 03/16/20      PT LONG TERM GOAL #5   Title ankle strength 5/5 throughout in bilateral feet for improved stability during single leg activities to reduce risk of falls    Time 12    Period Weeks    Status New  Target Date 03/16/20                 Plan - 02/09/20 1229    Clinical Impression Statement Pt has made progress with pain on the outside of her feet and responds well to exercises.  Pt felt the tape helped last time and got relief after taping today.  Pt was  able to porgress exercises to standing heel/toe raises and had no increased pain.  Pt is expected to progress slowly due to comorbidities and recurring fibromyalgia flare ups.  Pt will benefit from skilled PT to continue with pain management and strength as able.    Comorbidities OA bilat feet; fibromyalgia    PT Treatment/Interventions ADLs/Self Care Home Management;Cryotherapy;Electrical Stimulation;Iontophoresis 86m/ml Dexamethasone;Moist Heat;Ultrasound;Neuromuscular re-education;Therapeutic exercise;Therapeutic activities;Patient/family education;Manual techniques;Dry needling;Passive range of motion;Taping    PT Next Visit Plan progress ankle ex as able; BAPS, band, weight shifting on foam/rebounder; STM to plantaris, posterior tib, peroneals, gastroc/soleus; ktape if needed    PT Home Exercise Plan Access Code: [email or web address]    Consulted and Agree with Plan of Care Patient           Patient will benefit from skilled therapeutic intervention in order to improve the following deficits and impairments:  Abnormal gait,Increased fascial restricitons,Difficulty walking,Decreased range of motion,Pain,Decreased strength,Postural dysfunction  Visit Diagnosis: Pain in right foot  Pain in left foot  Muscle weakness (generalized)  Cramp and spasm     Problem List Patient Active Problem List   Diagnosis Date Noted  . GAD (generalized anxiety disorder) 12/26/2017  . OCD (obsessive compulsive disorder) 12/26/2017  . PTSD (post-traumatic stress disorder) 12/26/2017  . DDD (degenerative disc disease), cervical 07/14/2016  . Primary osteoarthritis of both feet 07/14/2016  . Primary osteoarthritis of both hands 07/14/2016  . Other fatigue 07/14/2016  . History of IBS 07/14/2016  . Osteopenia of multiple sites 07/14/2016  . Fibromyalgia 01/13/2016  . MGUS (monoclonal gammopathy of unknown significance) 12/23/2013  . Chronic cholecystitis without calculus 10/18/2012  . Abdominal pain,  unspecified site 10/18/2012  . Nausea alone 10/18/2012  . Unspecified constipation 10/18/2012  . Depression 10/18/2012  . Anxiety   . ASCUS (atypical squamous cells of undetermined significance) on Pap smear   . IUD   . Hemorrhoids 11/29/2010  . Abdominal pain, left upper quadrant 11/29/2010    JJule Ser PT 02/09/2020, 1:01 PM  [facility] [address] [place] [address]Phone: [phone]  Fax:  [phone] Name: Danielle SkiltonMRN: [id number] of Birth: [birth year]

## 2020-02-11 DIAGNOSIS — Z23 Encounter for immunization: Secondary | ICD-10-CM | POA: Diagnosis not present

## 2020-02-16 ENCOUNTER — Other Ambulatory Visit: Payer: Self-pay

## 2020-02-16 ENCOUNTER — Ambulatory Visit: Payer: Medicare Other | Admitting: Physical Therapy

## 2020-02-16 DIAGNOSIS — J3089 Other allergic rhinitis: Secondary | ICD-10-CM | POA: Diagnosis not present

## 2020-02-16 DIAGNOSIS — R252 Cramp and spasm: Secondary | ICD-10-CM

## 2020-02-16 DIAGNOSIS — M6281 Muscle weakness (generalized): Secondary | ICD-10-CM | POA: Diagnosis not present

## 2020-02-16 DIAGNOSIS — M79672 Pain in left foot: Secondary | ICD-10-CM | POA: Diagnosis not present

## 2020-02-16 DIAGNOSIS — M79671 Pain in right foot: Secondary | ICD-10-CM

## 2020-02-16 DIAGNOSIS — J301 Allergic rhinitis due to pollen: Secondary | ICD-10-CM | POA: Diagnosis not present

## 2020-02-16 DIAGNOSIS — J3081 Allergic rhinitis due to animal (cat) (dog) hair and dander: Secondary | ICD-10-CM | POA: Diagnosis not present

## 2020-02-16 NOTE — Therapy (Signed)
[facility] [address], [place], [place], [address] Phone: [phone]   Fax:  [phone]  Physical Therapy Treatment  Patient Details  Name: Danielle Bruce MRN: [id number] Date of Birth: 1960-04-01 Referring Provider (PT): Mayra Neer, MD   Encounter Date: 02/16/2020   PT End of Session - 02/16/20 1555    Visit Number 6    Date for PT Re-Evaluation 03/16/20    Authorization Type Medicare/Medicaid    PT Start Time 1450    PT Stop Time 1530    PT Time Calculation (min) 40 min    Activity Tolerance Patient tolerated treatment well    Behavior During Therapy [facility] for tasks assessed/performed           Past Medical History:  Diagnosis Date  . Anemia   . Anxiety   . Arthritis    osteoarthritis  . ASCUS (atypical squamous cells of undetermined significance) on Pap smear 05/06/05   NEG HIGH RISK HPV--C&B BIOPSY BENIGN 12/2005  . Asthma   . Bipolar 2 disorder ([place])   . Cancer ([place])    skin cancer - basal cell  . Colon polyps   . Complication of anesthesia    anxious afterwards, will get headaches   . Constipation   . Depression   . Fibromyalgia 10/2013  . GERD (gastroesophageal reflux disease)   . 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 Heart murmur    never had any problems  . Hemorrhoids   . High cholesterol   . 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   cytology negative  . IBS (irritable bowel syndrome)   . Insomnia   . Lymphocytic colitis   . Lymphoma ([place])   . 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 Migraines   . Osteoarthritis   . Osteopenia   . Peripheral neuropathy   . PTSD (post-traumatic stress disorder)     Past Surgical History:  Procedure Laterality Date  . BONE MARROW BIOPSY Left 12/18/2013   Plasma cell dyscrasia 8% population of plasma cells  . BREAST BIOPSY Right    benign stereo  . CESAREAN SECTION   [phone]  . CHOLECYSTECTOMY N/A 10/16/2012   Procedure: LAPAROSCOPIC CHOLECYSTECTOMY;  Surgeon: Joyice Faster. Cornett, MD;  Location: [facility];  Service: General;  Laterality: N/A;  . COLONOSCOPY     numerous times  . DILATION AND CURETTAGE OF UTERUS    . ESOPHAGOGASTRODUODENOSCOPY    . [place]   x3  . IUD REMOVAL  02/2015   Mirena  . LAPAROSCOPIC LYSIS OF ADHESIONS N/A 10/16/2012   Procedure: LAPAROSCOPIC LYSIS OF ADHESIONS;  Surgeon: Joyice Faster. Cornett, MD;  Location: [facility];  Service: General;  Laterality: N/A;  . LAPAROSCOPY N/A 10/16/2012   Procedure: LAPAROSCOPY DIAGNOSTIC;  Surgeon: Joyice Faster. Cornett, MD;  Location: [facility];  Service: General;  Laterality: N/A;  . PELVIC LAPAROSCOPY    . RADIOLOGY WITH ANESTHESIA N/A 12/16/2015   Procedure: MRI OF BRAIN WITH AND WITHOUT CONTRAST;  Surgeon: Medication Radiologist, MD;  Location: [place];  Service: Radiology;  Laterality: N/A;  . SHOULDER SURGERY  2007/2008  . SPINE SURGERY  2010   cervical    There were no vitals filed for this visit.   Subjective Assessment - 02/16/20 1508    Subjective Right now there is a "normal amount" of pain. I loved the tape    Pertinent  History goes by "Danielle Bruce" ; OA bilateral feet; fibromyalgia; off cycle of steriod medicine currenlty    Patient Stated Goals be able to walk barefoot in house and reduce pain in feet; able to go for walks    Currently in Pain? Yes    Pain Score 2     Pain Location Foot    Pain Orientation Right;Left    Pain Radiating Towards across the top from medial and lateral sides    Pain Onset More than a month ago    Multiple Pain Sites No                             [facility] Adult PT Treatment/Exercise - 02/16/20 0001      Manual Therapy   Soft tissue mobilization gentle STM to peroneals and posterior tib    Kinesiotex Facilitate Muscle      Kinesiotix   Facilitate Muscle  plantaris, post tib, achilles bilateral      Ankle Exercises: Seated    Heel Raises Limitations rocker board PF/DF      Ankle Exercises: Stretches   Soleus Stretch 3 reps;20 seconds    Gastroc Stretch 3 reps;20 seconds      Ankle Exercises: Standing   Other Standing Ankle Exercises heel/toe raises on foam mat - 20x; tandem standing on foam mat 3 x to fatigue                    PT Short Term Goals - 02/09/20 1232      PT SHORT TERM GOAL #1   Title be independent in initial HEP    Status Achieved      PT SHORT TERM GOAL #2   Baseline able to do band exercises without pain or spasms    Status On-going             PT Long Term Goals - 02/16/20 1559      PT LONG TERM GOAL #1   Title be independent in advanced HEP    Status On-going      PT LONG TERM GOAL #2   Title Able to walk around her home without wearing shoes or only wearing house shoes    Baseline still wearing shoes but is walking on hardwoods    Status On-going      PT LONG TERM GOAL #3   Title report 75% less neck pain throughout the day with normal functional activities    Status On-going      PT LONG TERM GOAL #4   Title able to go for 2 mile walk at least 3x/week for exercise    Status On-going                 Plan - 02/16/20 1555    Clinical Impression Statement Pt was not limited by pain today just fatigue.  Pt stood up after treatment without any limping.  Pt is making good progress with ankle stability and progressing with standing on the foam pad. Pt very challenged in tandem position and with rocking back on the heels.  Pt will benefit from skilled PT to continue working on LE and foot strength for improved ability to be up helping with her grandchildren    PT Treatment/Interventions ADLs/Self Care Home Management;Cryotherapy;Electrical Stimulation;Iontophoresis 26m/ml Dexamethasone;Moist Heat;Ultrasound;Neuromuscular re-education;Therapeutic exercise;Therapeutic activities;Patient/family education;Manual techniques;Dry needling;Passive range of  motion;Taping    PT Next Visit Plan progress ankle ex as able; BAPS, band, weight  shifting on foam/rebounder; STM to plantaris, posterior tib, peroneals, gastroc/soleus; ktape if needed    PT Home Exercise Plan Access Code: [email or web address]    Consulted and Agree with Plan of Care Patient           Patient will benefit from skilled therapeutic intervention in order to improve the following deficits and impairments:  Abnormal gait,Increased fascial restricitons,Difficulty walking,Decreased range of motion,Pain,Decreased strength,Postural dysfunction  Visit Diagnosis: Pain in right foot  Pain in left foot  Muscle weakness (generalized)  Cramp and spasm     Problem List Patient Active Problem List   Diagnosis Date Noted  . GAD (generalized anxiety disorder) 12/26/2017  . OCD (obsessive compulsive disorder) 12/26/2017  . PTSD (post-traumatic stress disorder) 12/26/2017  . DDD (degenerative disc disease), cervical 07/14/2016  . Primary osteoarthritis of both feet 07/14/2016  . Primary osteoarthritis of both hands 07/14/2016  . Other fatigue 07/14/2016  . History of IBS 07/14/2016  . Osteopenia of multiple sites 07/14/2016  . Fibromyalgia 01/13/2016  . MGUS (monoclonal gammopathy of unknown significance) 12/23/2013  . Chronic cholecystitis without calculus 10/18/2012  . Abdominal pain, unspecified site 10/18/2012  . Nausea alone 10/18/2012  . Unspecified constipation 10/18/2012  . Depression 10/18/2012  . Anxiety   . ASCUS (atypical squamous cells of undetermined significance) on Pap smear   . IUD   . Hemorrhoids 11/29/2010  . Abdominal pain, left upper quadrant 11/29/2010    Jule Ser, PT 02/16/2020, 4:00 PM  [facility] [address], [place], [place], [address] Phone: [phone]   Fax:  [phone]  Name: Caitlynne Harbeck MRN: [id number] Date of Birth: 12-23-1960

## 2020-02-19 ENCOUNTER — Other Ambulatory Visit: Payer: Self-pay | Admitting: Psychiatry

## 2020-02-23 ENCOUNTER — Encounter: Payer: Medicare Other | Admitting: Physical Therapy

## 2020-03-03 ENCOUNTER — Telehealth (INDEPENDENT_AMBULATORY_CARE_PROVIDER_SITE_OTHER): Payer: Medicare Other | Admitting: Psychiatry

## 2020-03-03 ENCOUNTER — Encounter: Payer: Medicare Other | Admitting: Physical Therapy

## 2020-03-03 ENCOUNTER — Encounter: Payer: Self-pay | Admitting: Psychiatry

## 2020-03-03 DIAGNOSIS — F4001 Agoraphobia with panic disorder: Secondary | ICD-10-CM | POA: Diagnosis not present

## 2020-03-03 DIAGNOSIS — F411 Generalized anxiety disorder: Secondary | ICD-10-CM | POA: Diagnosis not present

## 2020-03-03 DIAGNOSIS — F4310 Post-traumatic stress disorder, unspecified: Secondary | ICD-10-CM

## 2020-03-03 DIAGNOSIS — F422 Mixed obsessional thoughts and acts: Secondary | ICD-10-CM | POA: Diagnosis not present

## 2020-03-03 DIAGNOSIS — F902 Attention-deficit hyperactivity disorder, combined type: Secondary | ICD-10-CM

## 2020-03-03 DIAGNOSIS — F331 Major depressive disorder, recurrent, moderate: Secondary | ICD-10-CM | POA: Diagnosis not present

## 2020-03-03 MED ORDER — METHYLPHENIDATE HCL 10 MG PO TABS: 10.0000 mg | ORAL_TABLET | Freq: Every day | ORAL | 0 refills | Status: DC | PRN

## 2020-03-03 MED ORDER — FLUVOXAMINE MALEATE 25 MG PO TABS: 50.0000 mg | ORAL_TABLET | Freq: Two times a day (BID) | ORAL | 0 refills | Status: DC

## 2020-03-03 NOTE — Progress Notes (Signed)
Danielle Bruce [id number] 12/26/1960 60 y.o.   Subjective:   Patient ID:  Danielle Bruce is a 60 y.o. (DOB 02/06/1961) female.  Chief Complaint:  Chief Complaint  Patient presents with  . Follow-up  . Depression  . Anxiety    HPI Danielle Bruce presents for follow-up of multiple dxes.  visit in April and taken off lamotrigine DT possible skin reaction.  Had appt with [facility] dermatology and they assume that skin reaction with itching was related to lamotrigine. 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 Per oncology most likely drug is lamotrigine. And she weaned off of the medication to determine if the cutaneous lesions are related. Lesions seem less without lamotrigine.  At this point skin reaction is presumed related to lamotrigine.  No other disease sx. She is well aware that she has been on lamotrigine for many years with good results for depression control. There is a risk of relapse as she comes off of the medication and she is worried about that. Smart phone helps her cognitive  And other productivity.    seen October 2020.  Restarted low dosage ADD bc concerns over STM, concentration and productivity.  Ritalin 5 mg twice daily.  Also suggested she retry N-acetylcysteine for mild cognitive complaints.  Last seen April 07, 2019 the following was noted: Mostly good.  Anxiety is mostly better, but depression is a little worse seasonally.  Not able to go help her daughter.   Low energy but does have motivation.  Ritalin helps energy and focus but doesn't take it regularly bc irregular sleep cycle.   Ativan only when having medical issues.    Therapy working on trauma issues is very hard.  Struggling with the fact she doesn't have memory of large parts of her past.   No manic sx off mood stabilizers.  Covid isolation reduce stressors  overall and not prominently depressed.   No meds were changed.  June 25, 2019 appointment the following is noted: F real worried about Covid despite vaccination.   Started fluvoxamine 25 on May 14, 2019 from PCP mainly for inflammation but possibly thinking she might be depressed. Seen benefit for energy and sleep pattern and more appropriate feelings and less numb.  Now realizes she had been depressed for awhile but just numb with depression.  Anniversaries of deaths of friends including last BF Danielle Bruce a little over a year ago.   Thinks of him daily. His 58 yo D died of unknown cause recently.  Stress D moved to beach. Wonders about increasing the fluvoxamine  Danielle Bruce and hasn't seen him in a year. Danielle Bruce on disability for depression. Patient reports more depression.  Plan: Realized lately she's depressed and started Luvox again for it and for the anti-inflammatory potential for her health.  Has seen some benefit.   Prior benefit at 25 mg daily.  Optiion increase but slightly bc med sensitive. AGREE increase fluvoxamine to 37.5 mg daily.  08/06/2019 appointment with the following noted: Increased fluvoxamine to 37.5 mg and it helped the depression. Could see a big difference and had been more depressed than she even realized. Outlook better and sleep pattern normalized.    More energy and motivation.   But also started having prolonged HA.  HA lasted a couple of weeks.   Assumed HA was DT increased fluvoxamine so reduced to 25 mg on 07/28/19.  07/29/19  Episode of confusion occurred also.  Went to doctor while confused.  They couldn't determine cause.    Resolved in 1 day. FM flare up since fall/winter. To beach with daughter 3 mos minimum to give her shots for IVF treatment. Patient reports panic recently and recent difficulty with anxiety.    Occ flashbacks.   Patient denies difficulty with sleep initiation or maintenance. Denies appetite disturbance.  Patient reports that energy  and motivation have been good.  Patient denies any difficulty with concentration.  Patient denies any suicidal ideation. Plan: AGREE increase fluvoxamine to 37.5 mg daily once HA is controlled for several weekcs.  01/01/20 appt with the following noted: Increased fluvoxamine for awhile but got more HA and reduced it. Anxiety is better in general now.  But is interested in increasing it DT coming winter. When anxiety is not good it catches her off guard. Likely had Covid in August but early test was negative.  Got prednisone but sick for 5 weeks.  Lost taste and smell still going on.  D also had Covid.  Felt really sick and terrified with bad mental health at the time.  Was traumatic for her.  Had been vaccinated.   Had panic attack at the ER and took a long time to get the Ativan.  D moving back to area and pt will have to help with childcare so hopes to more aggressively treat her health problems. 2 gkids and 2 more coming. Danielle Bruce married without kids yet. Plan: started Luvox again for it and for the anti-inflammatory potential for her health.  Has seen some benefit.   Prior benefit at 25 mg daily.  Optiion increase but slightly bc med sensitive. AGREE increase fluvoxamine to 37.5 mg daily once HA is controlled for several weeks.  03/03/2020 appointment with the following noted: On Day 10 of Covid.  2nd bout. A lot easier this time. Had gotten Luvox up to 50 mg daily and tolerated.  Then doubled it to 100 mg daily. Wonders about the proper dose for maintenance.   No taste and smell, exhausted, congestion and mild cough.  Energy is better than it was in the beginning.  Made her depressed and anxious again and that part is better also.  Last time had to go to ER with Covid.  Only ER visit in 2-3 years.  Less migraine than in the past. Overall was doing better with last couple months.  Trying to do anti-inflammatory diet.  Vegetarian, no dairy or gluten.  Natural sugars.   Still some anxiety. D  moved back here and pregnant with 2nd set of twins. Current twins are 58 yo.   Will restart trauma therapy and it's helped over the last couple of years.  More freedom from things.      Less daily dread with anxiety but still some panic which can be triggered.  Less OCD with fluvoxamine unless triggered.   Taking Ritalin 10 mg each AM and tolerating and benefiting. occ NM with few dreams overall.   Past psych meds: Lithium, carbamazepine, oxcarbazepine, topiramate, valproic acid, lamotrigine, gabapentin  Zyprexa, Seroquel, Latuda headaches, Geodon,  Risperidone, Stelazine,  Rexulti,  Wellbutrin with side effects,  sertraline irritability, duloxetine, fluvoxamine 100 with Covid, fluoxetine, Stopped desipramine DT skin issues which now resolved.  It helped GI px.  buspirone side effects,   N-acetylcysteine, pramipexole,  methylphenidate,  Adderall,   Ambien with amnesia, trazodone, Ativan  Review of Systems:  Review of Systems  Constitutional: Positive for fatigue.  Cardiovascular: Negative for chest pain.  Gastrointestinal: Positive for abdominal distention and abdominal pain. Negative for vomiting.  Musculoskeletal: Positive for myalgias.  Neurological: Positive for dizziness and headaches. Negative for tremors and weakness.  Psychiatric/Behavioral: Positive for dysphoric mood. Negative for agitation, behavioral problems, confusion, hallucinations, self-injury and suicidal ideas. The patient is nervous/anxious.   Less HA with new meds.  Medications: I have reviewed the patient's current medications.  Current Outpatient Medications  Medication Sig Dispense Refill  . albuterol (ACCUNEB) 0.63 MG/3ML nebulizer solution Take 1 ampule by nebulization every 6 (six) hours as needed for wheezing.    Marland Kitchen albuterol (PROVENTIL HFA;VENTOLIN HFA) 108 (90 Base) MCG/ACT inhaler Inhale 2 puffs into the lungs every 6 (six) hours as needed for wheezing or shortness of breath.    . Ascorbic Acid (VITAMIN C)  100 MG tablet Take 100 mg by mouth daily.    Marland Kitchen azelastine (ASTELIN) 0.1 % nasal spray Place into both nostrils 2 (two) times daily. Use in each nostril as directed    . Azelastine HCl 0.15 % SOLN Place 2 sprays into both nostrils daily.    Marland Kitchen BEPREVE 1.5 % SOLN Place 1 drop into both eyes daily.     Marland Kitchen BREO ELLIPTA 200-25 MCG/INH AEPB 1 puff daily.    . Butalbital-APAP-Caffeine 50-300-40 MG CAPS butalbital-acetaminophen-caffeine 50 mg-300 mg-40 mg capsule  TAKE 1 CAPSULE BY MOUTH EVERY 4 HOURS AS NEEDED FOR MIGRAINE    . cyclobenzaprine (FLEXERIL) 10 MG tablet Take 10 mg by mouth at bedtime as needed.    Marland Kitchen dexlansoprazole (DEXILANT) 60 MG capsule Dexilant 60 mg capsule, delayed release  TAKE 1 CAPSULE BY MOUTH EVERY DAY    . diphenhydrAMINE (BENADRYL) 25 mg capsule Take 50 mg by mouth every 6 (six) hours as needed (HEADACHES).    Marland Kitchen EPINEPHrine 0.3 mg/0.3 mL IJ SOAJ injection as needed.    Eduard Roux 70 MG/ML SOAJ Inject into the skin.    . fluorouracil (EFUDEX) 5 % cream APPLY EXTERNALLY TO THE AFFECTED AREA ON THE SKIN TWICE DAILY FOR 2 WEEKS    . fluticasone (FLONASE) 50 MCG/ACT nasal spray Place 2 sprays into both nostrils daily. 16 g 0  . hydrOXYzine (ATARAX/VISTARIL) 25 MG tablet TK 1 TO 2 TS PO TID PRF MIGRAINE  4  . L-methylfolate Calcium 15 MG TABS TAKE 1 TABLET BY MOUTH DAILY 30 tablet 5  . levocetirizine (XYZAL) 5 MG tablet Take 5 mg by mouth every evening.  5  . LORazepam (ATIVAN) 1 MG tablet Take 1 tablet (1 mg total) by mouth every 8 (eight) hours as needed for anxiety. (takes 2 mg when having a medical procedure.) 30 tablet 1  . LORazepam (ATIVAN) 2 MG/ML concentrated solution Take 0.5 mLs (1 mg total) by mouth every 8 (eight) hours as needed for anxiety (severe panic). 30 mL 1  . MELATONIN PO Take 1.5 mg by mouth at bedtime.    . methylphenidate (RITALIN) 10 MG tablet Take 10 mg by mouth daily as needed.    . montelukast (SINGULAIR) 10 MG tablet Take 10 mg by mouth at bedtime.     Marland Kitchen oxyCODONE-acetaminophen (PERCOCET/ROXICET) 5-325 MG per tablet Take 2 tablets by mouth daily as needed for moderate pain or severe pain (mighraine.).     Marland Kitchen polyethylene glycol (MIRALAX / GLYCOLAX) packet Take 17 g by mouth daily as needed for mild constipation.    Marland Kitchen PRAVASTATIN SODIUM PO Take 40 mg by mouth.     . pregabalin (LYRICA) 50  MG capsule Take 50 mg by mouth 3 (three) times daily.    Marland Kitchen PROLIA 60 MG/ML SOSY injection BRING TO THE OFFICE FOR INJECTION ON 02/15/2018 AS DIRECTED ONCE EVERY 6 MONTHS    . promethazine (PHENERGAN) 25 MG tablet Take 25 mg by mouth every 6 (six) hours as needed for nausea.    . traZODone (DESYREL) 50 MG tablet TAKE 1 TABLET(50 MG) BY MOUTH AT BEDTIME 90 tablet 0  . Ubrogepant 50 MG TABS Take 50-100 mg by mouth daily as needed.    Marland Kitchen VASCEPA 1 g CAPS TK 2 CS PO BID WC  3  . VITAMIN D PO Take by mouth. Take 5059m daily    . VOLTAREN 1 % GEL Apply 2 g topically 4 (four) times daily as needed (JOINT PAIN).   3  . fluvoxaMINE (LUVOX) 25 MG tablet Take 2 tablets (50 mg total) by mouth 2 (two) times daily. 180 tablet 0   No current facility-administered medications for this visit.    Medication Side Effects: None  Allergies:  Allergies  Allergen Reactions  . Sulfa Antibiotics Nausea And Vomiting    Causes pt to vomit blood  . Gluten Meal Other (See Comments)    Sensitivity.   . Lactose Intolerance (Gi) Nausea And Vomiting  . Flagyl [Metronidazole Hcl] Nausea And Vomiting  . Morphine And Related Other (See Comments)    Reaction: Depression, emotional    Past Medical History:  Diagnosis Date  . Anemia   . Anxiety   . Arthritis    osteoarthritis  . ASCUS (atypical squamous cells of undetermined significance) on Pap smear 05/06/05   NEG HIGH RISK HPV--C&B BIOPSY BENIGN 12/2005  . Asthma   . Bipolar 2 disorder ([place]   . Cancer ([place]    skin cancer - basal cell  . Colon polyps   . Complication of anesthesia    anxious afterwards, will get  headaches   . Constipation   . Depression   . Fibromyalgia 10/2013  . GERD (gastroesophageal reflux disease)   . 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 Heart murmur    never had any problems  . Hemorrhoids   . High cholesterol   . 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   cytology negative  . IBS (irritable bowel syndrome)   . Insomnia   . Lymphocytic colitis   . Lymphoma ([place]   . 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 Migraines   . Osteoarthritis   . Osteopenia   . Peripheral neuropathy   . PTSD (post-traumatic stress disorder)     Family History  Problem Relation Age of Onset  . Diabetes Father   . Hypertension Father   . Hyperlipidemia Father   . Heart disease Maternal Grandfather   . Heart disease Paternal Grandmother   . Heart disease Paternal Grandfather   . Depression Son   . Depression Son   . Anxiety disorder Son   . Depression Daughter   . Anxiety disorder Daughter   . Asthma Daughter     Social History   Socioeconomic History  . Marital status: Divorced    Spouse name: Not on file  . Number of children: 3  . Years of education: Not on file  . Highest education level: Not on file  Occupational History  . Not on file  Tobacco Use  . Smoking status: Former Smoker    Types: Cigarettes  Quit date: 05/13/2014    Years since quitting: 5.8  . Smokeless tobacco: Never Used  . Tobacco comment: social   Vaping Use  . Vaping Use: Never used  Substance and Sexual Activity  . Alcohol use: Yes    Alcohol/week: 0.0 standard drinks    Comment: rare  . Drug use: Never  . Sexual activity: Not Currently    Comment: -1st intercourse 60 yo-More than 5 partners  Other Topics Concern  . Not on file  Social History Narrative  . Not on file   Social Determinants of Health   Financial Resource Strain: Not on file  Food Insecurity: Not on file  Transportation Needs: Not on file  Physical  Activity: Not on file  Stress: Not on file  Social Connections: Not on file  Intimate Partner Violence: Not on file    Past Medical History, Surgical history, Social history, and Family history were reviewed and updated as appropriate.   Please see review of systems for further details on the patient's review from today.   Objective:   Physical Exam:  LMP 12/28/2010   Physical Exam Constitutional:      General: She is not in acute distress. Musculoskeletal:        General: No deformity.  Neurological:     Mental Status: She is alert and oriented to person, place, and time.     Cranial Nerves: No dysarthria.     Coordination: Coordination normal.  Psychiatric:        Attention and Perception: Attention and perception normal. She does not perceive auditory or visual hallucinations.        Mood and Affect: Mood is anxious. Mood is not depressed. Affect is not labile, blunt, angry or inappropriate.        Speech: Speech normal.        Behavior: Behavior normal. Behavior is cooperative.        Thought Content: Thought content normal. Thought content is not paranoid or delusional. Thought content does not include homicidal or suicidal ideation. Thought content does not include homicidal or suicidal plan.        Cognition and Memory: Cognition and memory normal.        Judgment: Judgment normal.     Comments: Insight intact Depression resolved with fluvoxamine 50 routinely. Mood worsen when health problems worsen.  Better lately. Lately anxiety generally better.     Lab Review:     Component Value Date/Time   NA 142 08/01/2016 1713   NA 144 07/12/2015 1521   K 4.7 08/01/2016 1713   K 3.8 07/12/2015 1521   CL 107 08/01/2016 1713   CO2 23 08/01/2016 1713   CO2 27 07/12/2015 1521   GLUCOSE 95 08/01/2016 1713   GLUCOSE 82 07/12/2015 1521   BUN 14 08/01/2016 1713   BUN 10.3 07/12/2015 1521   CREATININE 0.87 08/01/2016 1713   CREATININE 0.8 07/12/2015 1521   CALCIUM 10.0  08/01/2016 1713   CALCIUM 9.9 07/12/2015 1521   PROT 7.4 08/01/2016 1713   PROT 7.4 07/12/2015 1521   ALBUMIN 4.8 08/01/2016 1713   ALBUMIN 4.4 07/12/2015 1521   AST 23 08/01/2016 1713   AST 22 07/12/2015 1521   ALT 26 08/01/2016 1713   ALT 31 07/12/2015 1521   ALKPHOS 55 08/01/2016 1713   ALKPHOS 54 07/12/2015 1521   BILITOT 0.7 08/01/2016 1713   BILITOT 0.40 07/12/2015 1521   GFRNONAA 76 08/01/2016 1713   GFRAA 88 08/01/2016 1713  Component Value Date/Time   WBC 5.9 08/01/2016 1713   RBC 4.71 08/01/2016 1713   HGB 14.6 08/01/2016 1713   HGB 14.1 07/12/2015 1521   HCT 43.1 08/01/2016 1713   HCT 41.5 07/12/2015 1521   PLT 254 08/01/2016 1713   PLT 248 07/12/2015 1521   MCV 91.5 08/01/2016 1713   MCV 92.2 07/12/2015 1521   MCH 31.0 08/01/2016 1713   MCHC 33.9 08/01/2016 1713   RDW 13.2 08/01/2016 1713   RDW 13.3 07/12/2015 1521   LYMPHSABS 2,006 08/01/2016 1713   LYMPHSABS 1.8 07/12/2015 1521   MONOABS 354 08/01/2016 1713   MONOABS 0.7 07/12/2015 1521   EOSABS 118 08/01/2016 1713   EOSABS 0.1 07/12/2015 1521   BASOSABS 0 08/01/2016 1713   BASOSABS 0.0 07/12/2015 1521    No results found for: POCLITH, LITHIUM   No results found for: PHENYTOIN, PHENOBARB, VALPROATE, CBMZ   .res Assessment: Plan:    Iqra was seen today for follow-up, depression and anxiety.  Diagnoses and all orders for this visit:  Major depressive disorder, recurrent episode, moderate (HCC)  PTSD (post-traumatic stress disorder) -     fluvoxaMINE (LUVOX) 25 MG tablet; Take 2 tablets (50 mg total) by mouth 2 (two) times daily.  Generalized anxiety disorder -     fluvoxaMINE (LUVOX) 25 MG tablet; Take 2 tablets (50 mg total) by mouth 2 (two) times daily.  Attention deficit hyperactivity disorder (ADHD), combined type  Panic disorder with agoraphobia -     fluvoxaMINE (LUVOX) 25 MG tablet; Take 2 tablets (50 mg total) by mouth 2 (two) times daily.  Mixed obsessional thoughts and  acts  Last year more obvious PTSD obvious has called into doubt the bipolar dix bc she's remained mood stable off the lamotrigine.  Need more time to reevaluate this and disc risk mania and mood cycling.  Discussed continued risk of hypomanic symptoms.  She has had symptoms consistent with hypomania in the past but there is now some question as to whether they were PTSD related rather than truly bipolar 2.  Her son has had treatment resistant depression with possible bipolar disorder and her daughter has been diagnosed with bipolar disorder but also a history of borderline personality disorder.  So there is a family history of the spectrum.  started Luvox again for it and for the anti-inflammatory potential for her health.  Has seen some benefit.   Prior benefit at 25 mg daily.  Med sensitive. AGREE increase fluvoxamine to  50  mg daily. Up to 100 mg daily if desired. Clear benefit from fluvoxamine for mood quite dramatically. No manic sx. Disc anti-inflammatory effects of fluvoxamine at doses of 100 mg BID for Covid.  2 studies proving benefit so far. HA are better.  Stress management for mood sx. Health problems worsen mood episodically.    We discussed the short-term risks associated with benzodiazepines including sedation and increased fall risk among others.  Discussed long-term side effect risk including dependence, potential withdrawal symptoms, and the potential eventual dose-related risk of dementia.  Keep this at a minimum given cognitive concerns.  She only uses it when triggered with anxiety usually medical.  She is not using it regularly.  Discussed potential benefits, risks, and side effects of stimulants with patient to include increased heart rate, palpitations, insomnia, increased anxiety, increased irritability, or decreased appetite.  Instructed patient to contact office if experiencing any significant tolerability issues. Benefit so continue Ritalin 5 BID or 10 mg AM for cognitive  and ADD reasons and disc risk of palpitations.   Continue therapy.  She is working well on things. Plans more therapy soon.  Greater than 50% of 30 mins of webex face to face time with patient was spent on counseling and coordination of care.   FU 3 mos  Danielle Parents, MD, DFAPA    Please see After Visit Summary for patient specific instructions.  Future Appointments  Date Time Provider [place]  03/10/2020 12:30 PM Desenglau, Tommy Rainwater, PT [facility]  03/15/2020  2:00 PM Desenglau, Tommy Rainwater, PT [facility]  06/29/2020  1:15 PM Deveshwar, Abel Presto, MD [facility] None    No orders of the defined types were placed in this encounter.     -------------------------------

## 2020-03-04 ENCOUNTER — Ambulatory Visit: Payer: Medicare Other | Admitting: Physical Therapy

## 2020-03-04 DIAGNOSIS — U071 COVID-19: Secondary | ICD-10-CM | POA: Diagnosis not present

## 2020-03-09 ENCOUNTER — Encounter: Payer: Medicare Other | Admitting: Physical Therapy

## 2020-03-10 ENCOUNTER — Ambulatory Visit: Payer: Medicare Other | Attending: Family Medicine | Admitting: Physical Therapy

## 2020-03-10 ENCOUNTER — Other Ambulatory Visit: Payer: Self-pay

## 2020-03-10 ENCOUNTER — Encounter: Payer: Self-pay | Admitting: Physical Therapy

## 2020-03-10 DIAGNOSIS — M353 Polymyalgia rheumatica: Secondary | ICD-10-CM | POA: Diagnosis not present

## 2020-03-10 DIAGNOSIS — M79671 Pain in right foot: Secondary | ICD-10-CM | POA: Diagnosis not present

## 2020-03-10 DIAGNOSIS — R1032 Left lower quadrant pain: Secondary | ICD-10-CM | POA: Diagnosis not present

## 2020-03-10 DIAGNOSIS — G43909 Migraine, unspecified, not intractable, without status migrainosus: Secondary | ICD-10-CM | POA: Diagnosis not present

## 2020-03-10 DIAGNOSIS — M79672 Pain in left foot: Secondary | ICD-10-CM | POA: Diagnosis not present

## 2020-03-10 DIAGNOSIS — M6281 Muscle weakness (generalized): Secondary | ICD-10-CM | POA: Diagnosis not present

## 2020-03-10 DIAGNOSIS — R252 Cramp and spasm: Secondary | ICD-10-CM

## 2020-03-10 DIAGNOSIS — R293 Abnormal posture: Secondary | ICD-10-CM | POA: Insufficient documentation

## 2020-03-10 DIAGNOSIS — R0902 Hypoxemia: Secondary | ICD-10-CM | POA: Diagnosis not present

## 2020-03-10 DIAGNOSIS — F3181 Bipolar II disorder: Secondary | ICD-10-CM | POA: Diagnosis not present

## 2020-03-10 DIAGNOSIS — R5383 Other fatigue: Secondary | ICD-10-CM | POA: Diagnosis not present

## 2020-03-10 DIAGNOSIS — U071 COVID-19: Secondary | ICD-10-CM | POA: Diagnosis not present

## 2020-03-10 NOTE — Therapy (Signed)
[employer] [facility] [address], [place], [place], [address] Phone: [phone]   Fax:  [phone]  Physical Therapy Treatment  Patient Details  Name: Danielle Bruce MRN: [id number] Date of Birth: [birth year] Referring Provider (PT): Mayra Neer, MD   Encounter Date: 03/10/2020   PT End of Session - 03/10/20 1321    Visit Number 7    Date for PT Re-Evaluation 03/16/20    Authorization Type Medicare/Medicaid    PT Start Time 1230    PT Stop Time 1315    PT Time Calculation (min) 45 min    Activity Tolerance Patient tolerated treatment well    Behavior During Therapy [facility] for tasks assessed/performed           Past Medical History:  Diagnosis Date  . Anemia   . Anxiety   . Arthritis    osteoarthritis  . ASCUS (atypical squamous cells of undetermined significance) on Pap smear 05/06/05   NEG HIGH RISK HPV--C&B BIOPSY BENIGN 12/2005  . Asthma   . Bipolar 2 disorder ([place])   . Cancer ([place])    skin cancer - basal cell  . Colon polyps   . Complication of anesthesia    anxious afterwards, will get headaches   . Constipation   . Depression   . Fibromyalgia 10/2013  . GERD (gastroesophageal reflux disease)   . 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 Heart murmur    never had any problems  . Hemorrhoids   . High cholesterol   . 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   cytology negative  . IBS (irritable bowel syndrome)   . Insomnia   . Lymphocytic colitis   . Lymphoma ([place])   . 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 Migraines   . Osteoarthritis   . Osteopenia   . Peripheral neuropathy   . PTSD (post-traumatic stress disorder)     Past Surgical History:  Procedure Laterality Date  . BONE MARROW BIOPSY Left 12/18/2013   Plasma cell dyscrasia 8% population of plasma cells  . BREAST BIOPSY Right    benign stereo  . CESAREAN SECTION   [phone]  . CHOLECYSTECTOMY N/A 10/16/2012   Procedure: LAPAROSCOPIC CHOLECYSTECTOMY;  Surgeon: Joyice Faster. Cornett, MD;  Location: [facility];  Service: General;  Laterality: N/A;  . COLONOSCOPY     numerous times  . DILATION AND CURETTAGE OF UTERUS    . ESOPHAGOGASTRODUODENOSCOPY    . [place]   x3  . IUD REMOVAL  02/2015   Mirena  . LAPAROSCOPIC LYSIS OF ADHESIONS N/A 10/16/2012   Procedure: LAPAROSCOPIC LYSIS OF ADHESIONS;  Surgeon: Joyice Faster. Cornett, MD;  Location[facility];  Service: General;  Laterality: N/A;  . LAPAROSCOPY N/A 10/16/2012   Procedure: LAPAROSCOPY DIAGNOSTIC;  Surgeon: Joyice Faster. Cornett, MD;  Location: [facility];  Service: General;  Laterality: N/A;  . PELVIC LAPAROSCOPY    . RADIOLOGY WITH ANESTHESIA N/A 12/16/2015   Procedure: MRI OF BRAIN WITH AND WITHOUT CONTRAST;  Surgeon: Medication Radiologist, MD;  Location: [place];  Service: Radiology;  Laterality: N/A;  . SHOULDER SURGERY  2007/2008  . SPINE SURGERY  2010   cervical    There were no vitals filed for this visit.   Subjective Assessment - 03/10/20 1324    Subjective I only feel it when my foot is stepped on which wouldn't normally cause pain.  Limitations Standing;Walking;House hold activities    Patient Stated Goals be able to walk barefoot in house and reduce pain in feet; able to go for walks    Currently in Pain? No/denies                             [facility] Adult PT Treatment/Exercise - 03/10/20 0001      Exercises   Exercises Ankle      Manual Therapy   Soft tissue mobilization gentle STM to peroneals and posterior tib    Kinesiotex Facilitate Muscle      Kinesiotix   Facilitate Muscle  plantaris, post tib, achilles bilateral      Ankle Exercises: Standing   Other Standing Ankle Exercises heel/toe raises on foam mat - 20x; tandem standing on foam mat 3 x to fatigue, head turns in tandem, feet together standing; single leg on foam marching and hip abduction       Ankle Exercises: Seated   BAPS Sitting;Level 3;10 reps   circle both ways                   PT Short Term Goals - 02/09/20 1232      PT SHORT TERM GOAL #1   Title be independent in initial HEP    Status Achieved      PT SHORT TERM GOAL #2   Baseline able to do band exercises without pain or spasms    Status On-going             PT Long Term Goals - 02/16/20 1559      PT LONG TERM GOAL #1   Title be independent in advanced HEP    Status On-going      PT LONG TERM GOAL #2   Title Able to walk around her home without wearing shoes or only wearing house shoes    Baseline still wearing shoes but is walking on hardwoods    Status On-going      PT LONG TERM GOAL #3   Title report 75% less neck pain throughout the day with normal functional activities    Status On-going      PT LONG TERM GOAL #4   Title able to go for 2 mile walk at least 3x/week for exercise    Status On-going                 Plan - 03/10/20 1316    Clinical Impression Statement Pt returned to PT after hiatus due to being sick.  Pt still doing better with pain on outside of feet but was more medial arch pain and tension today.  Pt denies pain other than when playing with grandkids and they step on her feet.  Pt did well with ex's on foam mat.  Demonstrates a good challenge and able to add to her HEP.  Continue LE and ankle strength and stability    PT Treatment/Interventions ADLs/Self Care Home Management;Cryotherapy;Electrical Stimulation;Iontophoresis 47m/ml Dexamethasone;Moist Heat;Ultrasound;Neuromuscular re-education;Therapeutic exercise;Therapeutic activities;Patient/family education;Manual techniques;Dry needling;Passive range of motion;Taping    PT Next Visit Plan progress ankle ex as able; BAPS, band, weight shifting on foam/rebounder; STM to plantaris, posterior tib, peroneals, gastroc/soleus; ktape if needed    PT Home Exercise Plan Access Code: [email or web address]    Consulted and Agree with  Plan of Care Patient           Patient will benefit from skilled therapeutic intervention in order to improve  the following deficits and impairments:  Abnormal gait,Increased fascial restricitons,Difficulty walking,Decreased range of motion,Pain,Decreased strength,Postural dysfunction  Visit Diagnosis: Pain in right foot  Pain in left foot  Muscle weakness (generalized)  Cramp and spasm     Problem List Patient Active Problem List   Diagnosis Date Noted  . GAD (generalized anxiety disorder) 12/26/2017  . OCD (obsessive compulsive disorder) 12/26/2017  . PTSD (post-traumatic stress disorder) 12/26/2017  . DDD (degenerative disc disease), cervical 07/14/2016  . Primary osteoarthritis of both feet 07/14/2016  . Primary osteoarthritis of both hands 07/14/2016  . Other fatigue 07/14/2016  . History of IBS 07/14/2016  . Osteopenia of multiple sites 07/14/2016  . Fibromyalgia 01/13/2016  . MGUS (monoclonal gammopathy of unknown significance) 12/23/2013  . Chronic cholecystitis without calculus 10/18/2012  . Abdominal pain, unspecified site 10/18/2012  . Nausea alone 10/18/2012  . Unspecified constipation 10/18/2012  . Depression 10/18/2012  . Anxiety   . ASCUS (atypical squamous cells of undetermined significance) on Pap smear   . IUD   . Hemorrhoids 11/29/2010  . Abdominal pain, left upper quadrant 11/29/2010    Jule Ser, PT 03/10/2020, 1:28 PM  [facility] [address], [place], [place], [address] Phone: [phone]   Fax:  [phone]  Name: Danielle Bruce MRN: [id number] Date of Birth: Sep 07, 1960

## 2020-03-11 ENCOUNTER — Other Ambulatory Visit: Payer: Self-pay | Admitting: Family Medicine

## 2020-03-11 DIAGNOSIS — R1032 Left lower quadrant pain: Secondary | ICD-10-CM

## 2020-03-11 DIAGNOSIS — R0902 Hypoxemia: Secondary | ICD-10-CM

## 2020-03-12 DIAGNOSIS — D120 Benign neoplasm of cecum: Secondary | ICD-10-CM | POA: Diagnosis not present

## 2020-03-12 DIAGNOSIS — Z86010 Personal history of colonic polyps: Secondary | ICD-10-CM | POA: Diagnosis not present

## 2020-03-15 ENCOUNTER — Ambulatory Visit: Payer: Medicare Other | Admitting: Physical Therapy

## 2020-03-16 ENCOUNTER — Encounter: Payer: Medicare Other | Admitting: Physical Therapy

## 2020-03-17 DIAGNOSIS — D120 Benign neoplasm of cecum: Secondary | ICD-10-CM | POA: Diagnosis not present

## 2020-03-26 ENCOUNTER — Ambulatory Visit
Admission: RE | Admit: 2020-03-26 | Discharge: 2020-03-26 | Disposition: A | Discharge status: Home / Self Care | Payer: Medicare Other | Source: Ambulatory Visit | Attending: Family Medicine | Admitting: Family Medicine

## 2020-03-26 DIAGNOSIS — K429 Umbilical hernia without obstruction or gangrene: Secondary | ICD-10-CM | POA: Diagnosis not present

## 2020-03-26 DIAGNOSIS — R918 Other nonspecific abnormal finding of lung field: Secondary | ICD-10-CM | POA: Diagnosis not present

## 2020-03-26 DIAGNOSIS — R0902 Hypoxemia: Secondary | ICD-10-CM

## 2020-03-26 DIAGNOSIS — N281 Cyst of kidney, acquired: Secondary | ICD-10-CM | POA: Diagnosis not present

## 2020-03-26 DIAGNOSIS — K7689 Other specified diseases of liver: Secondary | ICD-10-CM | POA: Diagnosis not present

## 2020-03-26 DIAGNOSIS — R1032 Left lower quadrant pain: Secondary | ICD-10-CM

## 2020-03-26 DIAGNOSIS — J8410 Pulmonary fibrosis, unspecified: Secondary | ICD-10-CM | POA: Diagnosis not present

## 2020-03-26 MED ORDER — IOPAMIDOL (ISOVUE-300) INJECTION 61%
100.0000 mL | Freq: Once | INTRAVENOUS | Status: AC | PRN
  Administered 2020-03-26: 100 mL via INTRAVENOUS

## 2020-03-29 ENCOUNTER — Other Ambulatory Visit: Payer: Self-pay

## 2020-03-29 ENCOUNTER — Ambulatory Visit: Payer: Medicare Other | Admitting: Physical Therapy

## 2020-03-29 DIAGNOSIS — M6281 Muscle weakness (generalized): Secondary | ICD-10-CM | POA: Diagnosis not present

## 2020-03-29 DIAGNOSIS — R293 Abnormal posture: Secondary | ICD-10-CM

## 2020-03-29 DIAGNOSIS — M79671 Pain in right foot: Secondary | ICD-10-CM | POA: Diagnosis not present

## 2020-03-29 DIAGNOSIS — M79672 Pain in left foot: Secondary | ICD-10-CM | POA: Diagnosis not present

## 2020-03-29 DIAGNOSIS — M353 Polymyalgia rheumatica: Secondary | ICD-10-CM

## 2020-03-29 DIAGNOSIS — R252 Cramp and spasm: Secondary | ICD-10-CM

## 2020-03-29 NOTE — Therapy (Signed)
[facility] [address] [place], [place], [address] Phone: [phone]   Fax:  [phone]  Physical Therapy Treatment  Patient Details  Name: Danielle Bruce MRN: [id number] Date of Birth: 28-Feb-1960 Referring Provider (PT): Danielle Neer, MD   Encounter Date: 03/29/2020   PT End of Session - 03/29/20 1035    Visit Number 8    Date for PT Re-Evaluation 06/21/20    Authorization Type Medicare/Medicaid    PT Start Time 1021    PT Stop Time 1100    PT Time Calculation (min) 39 min    Activity Tolerance Patient tolerated treatment well    Behavior During Therapy [facility] for tasks assessed/performed           Past Medical History:  Diagnosis Date  . Anemia   . Anxiety   . Arthritis    osteoarthritis  . ASCUS (atypical squamous cells of undetermined significance) on Pap smear 05/06/05   NEG HIGH RISK HPV--C&B BIOPSY BENIGN 12/2005  . Asthma   . Bipolar 2 disorder ([place])   . Cancer ([place])    skin cancer - basal cell  . Colon polyps   . Complication of anesthesia    anxious afterwards, will get headaches   . Constipation   . Depression   . Fibromyalgia 10/2013  . GERD (gastroesophageal reflux disease)   . 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 Heart murmur    never had any problems  . Hemorrhoids   . High cholesterol   . 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   cytology negative  . IBS (irritable bowel syndrome)   . Insomnia   . Lymphocytic colitis   . Lymphoma ([place])   . 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 Migraines   . Osteoarthritis   . Osteopenia   . Peripheral neuropathy   . PTSD (post-traumatic stress disorder)     Past Surgical History:  Procedure Laterality Date  . BONE MARROW BIOPSY Left 12/18/2013   Plasma cell dyscrasia 8% population of plasma cells  . BREAST BIOPSY Right    benign stereo  . CESAREAN SECTION  [phone]   . CHOLECYSTECTOMY N/A 10/16/2012   Procedure: LAPAROSCOPIC CHOLECYSTECTOMY;  Surgeon: Joyice Faster. Cornett, MD;  [facility];  Service: General;  Laterality: N/A;  . COLONOSCOPY     numerous times  . DILATION AND CURETTAGE OF UTERUS    . ESOPHAGOGASTRODUODENOSCOPY    . [place]   x3  . IUD REMOVAL  02/2015   Mirena  . LAPAROSCOPIC LYSIS OF ADHESIONS N/A 10/16/2012   Procedure: LAPAROSCOPIC LYSIS OF ADHESIONS;  Surgeon: Joyice Faster. Cornett, MD;  [facility];  Service: General;  Laterality: N/A;  . LAPAROSCOPY N/A 10/16/2012   Procedure: LAPAROSCOPY DIAGNOSTIC;  Surgeon: Joyice Faster. Cornett, MD;  [facility];  Service: General;  Laterality: N/A;  . PELVIC LAPAROSCOPY    . RADIOLOGY WITH ANESTHESIA N/A 12/16/2015   Procedure: MRI OF BRAIN WITH AND WITHOUT CONTRAST;  Surgeon: Medication Radiologist, MD;  Location: [place];  Service: Radiology;  Laterality: N/A;  . SHOULDER SURGERY  2007/2008  . SPINE SURGERY  2010   cervical    There were no vitals filed for this visit.   Subjective Assessment - 03/29/20 1025    Subjective Pt has new order to add diagnosis for her Polymyalgia rheumatica.  She has pain in all  joints but if varies from day to day what is more inflamed on a given day.  Pt has had good response from PT with different diagnosis for specific regions of the body but the pain occurs all over and she will benefit from exercises for everything.  Pt has used TENS that she has at home with success.    Pertinent History goes by "Danielle Bruce" ; OA bilateral feet; fibromyalgia; off cycle of steriod medicine currenlty    Patient Stated Goals Pt wants pain management and be able to move more without taking medication    Currently in Pain? Yes    Pain Location Elbow    Pain Orientation Right    Pain Descriptors / Indicators Sore    Pain Type Chronic pain    Pain Radiating Towards radial side of forearm around lateral elbow and into the triceps    Pain Frequency  Intermittent    Multiple Pain Sites No              [facility] PT Assessment - 03/29/20 0001      Assessment   Medical Diagnosis M77.41,M77.42 (ICD-10-CM) - Metatarsalgia of both feet; M35.3 (ICD-10-CM) - Polymyalgia rheumatica    Referring Provider (PT) Danielle Neer, MD    Onset Date/Surgical Date --   ongoing for years     Precautions   Precautions None      Restrictions   Weight Bearing Restrictions No      [place] residence    Living Arrangements Alone      Posture/Postural Control   Posture/Postural Control Postural limitations    Postural Limitations Rounded Shoulders;Forward head;Anterior pelvic tilt;Flexed trunk      AROM   Overall AROM Comments lumbar fwd flex hands to just above ankles lumbar tight      Strength   Overall Strength Comments Lt supination +pain 4/5    Strength Assessment Site Ankle;Knee;Hip;Hand;Wrist;Elbow    Right/Left Elbow Right;Left    Right Elbow Flexion 5/5    Right Elbow Extension 5/5    Left Elbow Flexion 4/5   some pain with palm down   Right/Left Wrist Right;Left    Right Wrist Extension 5/5    Left Wrist Extension 4/5   pain   Right/Left hand Right;Left    Right Hand Grip (lbs) 29    Left Hand Grip (lbs) 20    Right/Left Hip Right;Left    Right Hip Flexion 5/5    Right Hip ABduction 5/5    Left Hip Flexion 4+/5    Left Hip Extension 4+/5    Left Hip ABduction 4/5    Left Hip ADduction 4/5    Right Ankle Dorsiflexion 5/5    Left Ankle Dorsiflexion 4+/5    Left Ankle Inversion 5/5    Left Ankle Eversion 4+/5      Palpation   Palpation comment lumbar and thoracic tight, low tone abdominals, TTP lateral Rt foot; some popping Lt hip with MMT; Lt wrist extensors and supinator tight and TTP      Ambulation/Gait   Gait Pattern Decreased stride length                         [facility] Adult PT Treatment/Exercise - 03/29/20 0001      Manual Therapy   Myofascial Release Lt wrist  extensors                    PT Short Term  Goals - 03/29/20 1030      PT SHORT TERM GOAL #1   Title be independent in initial HEP    Status Achieved             PT Long Term Goals - 03/29/20 1532      PT LONG TERM GOAL #1   Title be independent in advanced HEP    Time 12    Period Weeks    Status On-going    Target Date 06/21/20      PT LONG TERM GOAL #2   Title Pt will be able to walk at least 1 mile without increased pain so she can return to birding    Time 12    Period Weeks    Status Revised    Target Date 06/21/20      PT LONG TERM GOAL #3   Title report 75% less neck/UE pain throughout the day with normal functional activities    Time 12    Period Weeks    Status New    Target Date 06/21/20      PT LONG TERM GOAL #4   Title Pt will be able to hold her grandchildren for at least 1 hour throughout the day in order to help her daughter with child care    Baseline increases pain    Time 12    Period Weeks    Status Revised    Target Date 06/21/20      PT LONG TERM GOAL #5   Title Pt will demonstrate at least 4+/5 wrist and hip strength for improved stability for walking and lifting    Time 12    Period Weeks    Status Revised    Target Date 06/21/20                 Plan - 03/29/20 1103    Clinical Impression Statement Pt presents to clinic with diagnosis to be added to her existing order. She has been feeling better from PT to address the feet with some pain still intermittent around the Rt lateral dorsal foot.  Pt has responded well to gentle exercises, myofascial massage for pain management, and K-taping for increased space and muscle inhibition.  Pt was assessed for general strength and palpation reveals some muscle tenderness and trigger points.  pt has posture deficits as mentioned .  She has UE and LE weakness Lt>Rt especially the Lt wrist grip and wrist extensors and hip regions.  She will benefit from skilled PT to address postural  strength, LE especially Lt hip and UE . PT recommends addressing above mentioned impairments and pain management for improved funciton increased walking and ability to lift and hold grandchildren.    Personal Factors and Comorbidities Comorbidity 2    Comorbidities OA bilat feet; fibromyalgia    Stability/Clinical Decision Making Evolving/Moderate complexity    PT Frequency 1x / week    PT Duration 12 weeks    PT Treatment/Interventions ADLs/Self Care Home Management;Cryotherapy;Electrical Stimulation;Iontophoresis 4m/ml Dexamethasone;Moist Heat;Ultrasound;Neuromuscular re-education;Therapeutic exercise;Therapeutic activities;Patient/family education;Manual techniques;Dry needling;Passive range of motion;Taping    PT Next Visit Plan fascial release to Lt forearm, possibly k-tape wrist extensors/tricep; radial nerve glides; gentle wrist strengthening    PT Home Exercise Plan Access Code: [email or web address]    Consulted and Agree with Plan of Care Patient           Patient will benefit from skilled therapeutic intervention in order to improve the following deficits and impairments:  Abnormal gait,Increased fascial  restricitons,Difficulty walking,Decreased range of motion,Pain,Decreased strength,Postural dysfunction  Visit Diagnosis: Polymyalgia rheumatica ([facility])  Muscle weakness (generalized)  Cramp and spasm  Abnormal posture     Problem List Patient Active Problem List   Diagnosis Date Noted  . GAD (generalized anxiety disorder) 12/26/2017  . OCD (obsessive compulsive disorder) 12/26/2017  . PTSD (post-traumatic stress disorder) 12/26/2017  . DDD (degenerative disc disease), cervical 07/14/2016  . Primary osteoarthritis of both feet 07/14/2016  . Primary osteoarthritis of both hands 07/14/2016  . Other fatigue 07/14/2016  . History of IBS 07/14/2016  . Osteopenia of multiple sites 07/14/2016  . Fibromyalgia 01/13/2016  . MGUS (monoclonal gammopathy of unknown significance) 12/23/2013   . Chronic cholecystitis without calculus 10/18/2012  . Abdominal pain, unspecified site 10/18/2012  . Nausea alone 10/18/2012  . Unspecified constipation 10/18/2012  . Depression 10/18/2012  . Anxiety   . ASCUS (atypical squamous cells of undetermined significance) on Pap smear   . IUD   . Hemorrhoids 11/29/2010  . Abdominal pain, left upper quadrant 11/29/2010    Jule Ser, PT 03/29/2020, 3:37 PM  [facility] [address] [place], [place], [address] Phone: [phone]   Fax:  [phone]  Name: Danielle Bruce MRN: [id number] Date of Birth: [birth year]

## 2020-04-05 ENCOUNTER — Other Ambulatory Visit: Payer: Self-pay | Admitting: Psychiatry

## 2020-04-07 ENCOUNTER — Other Ambulatory Visit: Payer: Self-pay

## 2020-04-07 ENCOUNTER — Ambulatory Visit: Payer: Medicare Other | Attending: Family Medicine

## 2020-04-07 DIAGNOSIS — M353 Polymyalgia rheumatica: Secondary | ICD-10-CM

## 2020-04-07 DIAGNOSIS — M79671 Pain in right foot: Secondary | ICD-10-CM | POA: Diagnosis not present

## 2020-04-07 DIAGNOSIS — R293 Abnormal posture: Secondary | ICD-10-CM | POA: Insufficient documentation

## 2020-04-07 DIAGNOSIS — M6281 Muscle weakness (generalized): Secondary | ICD-10-CM | POA: Diagnosis not present

## 2020-04-07 DIAGNOSIS — R252 Cramp and spasm: Secondary | ICD-10-CM

## 2020-04-07 DIAGNOSIS — M79672 Pain in left foot: Secondary | ICD-10-CM

## 2020-04-07 NOTE — Patient Instructions (Signed)
Access Code: [email or web address] URL: [email or web address] Date: 04/07/2020 Prepared by: Claiborne Billings  Exercises  Seated Wrist Flexion Stretch - 2 x daily - 7 x weekly - 1 sets - 5 reps - 10-15 hold Seated Wrist Extension Stretch - 2 x daily - 7 x weekly - 1 sets - 5 reps - 10-15 hold Standing Radial Nerve Glide - 2 x daily - 7 x weekly - 1 sets - 10 reps - 5 hold

## 2020-04-07 NOTE — Therapy (Signed)
[facility] [address], [place], [place], [address] Phone: [phone]   Fax:  [phone]  Physical Therapy Treatment  Patient Details  Name: Danielle Bruce MRN: [id number] Date of Birth: [birth year] Referring Provider (PT): Mayra Neer, MD   Encounter Date: 04/07/2020   PT End of Session - 04/07/20 1628    Visit Number 9    Date for PT Re-Evaluation 06/21/20    Authorization Type Medicare/Medicaid    PT Start Time 1534    PT Stop Time 1624    PT Time Calculation (min) 50 min    Activity Tolerance Patient tolerated treatment well    Behavior During Therapy [facility] for tasks assessed/performed           Past Medical History:  Diagnosis Date  . Anemia   . Anxiety   . Arthritis    osteoarthritis  . ASCUS (atypical squamous cells of undetermined significance) on Pap smear 05/06/05   NEG HIGH RISK HPV--C&B BIOPSY BENIGN 12/2005  . Asthma   . Bipolar 2 disorder ([place])   . Cancer ([place])    skin cancer - basal cell  . Colon polyps   . Complication of anesthesia    anxious afterwards, will get headaches   . Constipation   . Depression   . Fibromyalgia 10/2013  . GERD (gastroesophageal reflux disease)   . 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 Heart murmur    never had any problems  . Hemorrhoids   . High cholesterol   . 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   cytology negative  . IBS (irritable bowel syndrome)   . Insomnia   . Lymphocytic colitis   . Lymphoma ([place])   . 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 Migraines   . Osteoarthritis   . Osteopenia   . Peripheral neuropathy   . PTSD (post-traumatic stress disorder)     Past Surgical History:  Procedure Laterality Date  . BONE MARROW BIOPSY Left 12/18/2013   Plasma cell dyscrasia 8% population of plasma cells  . BREAST BIOPSY Right    benign stereo  . CESAREAN SECTION  [phone]   . CHOLECYSTECTOMY N/A 10/16/2012   Procedure: LAPAROSCOPIC CHOLECYSTECTOMY;  Surgeon: Joyice Faster. Cornett, MD;  Location: [facility];  Service: General;  Laterality: N/A;  . COLONOSCOPY     numerous times  . DILATION AND CURETTAGE OF UTERUS    . ESOPHAGOGASTRODUODENOSCOPY    . [place]   x3  . IUD REMOVAL  02/2015   Mirena  . LAPAROSCOPIC LYSIS OF ADHESIONS N/A 10/16/2012   Procedure: LAPAROSCOPIC LYSIS OF ADHESIONS;  Surgeon: Joyice Faster. Cornett, MD;  Location: [facility];  Service: General;  Laterality: N/A;  . LAPAROSCOPY N/A 10/16/2012   Procedure: LAPAROSCOPY DIAGNOSTIC;  Surgeon: Joyice Faster. Cornett, MD;  Location: [facility];  Service: General;  Laterality: N/A;  . PELVIC LAPAROSCOPY    . RADIOLOGY WITH ANESTHESIA N/A 12/16/2015   Procedure: MRI OF BRAIN WITH AND WITHOUT CONTRAST;  Surgeon: Medication Radiologist, MD;  Location: [place];  Service: Radiology;  Laterality: N/A;  . SHOULDER SURGERY  2007/2008  . SPINE SURGERY  2010   cervical    There were no vitals filed for this visit.   Subjective Assessment - 04/07/20 1534    Subjective I'm hurting today.  My Lt arm and knees are the worst.    Currently in  Pain? Yes    Pain Score 6     Pain Location Foot   elbow and knees   Pain Orientation Left    Pain Descriptors / Indicators Aching;Sore    Pain Type Chronic pain    Pain Onset More than a month ago    Pain Frequency Intermittent    Aggravating Factors  walking and moving    Pain Relieving Factors rest, stretching                             [facility] Adult PT Treatment/Exercise - 04/07/20 0001      Exercises   Exercises Ankle;Elbow;Wrist      Wrist Exercises   Wrist Flexion Self ROM;Left;5 reps;Seated    Wrist Extension Self ROM;5 reps;Seated      Manual Therapy   Manual Therapy Taping    Soft tissue mobilization gentle STM to peroneals and posterior tib, Lt forearm mobilzation    Myofascial Release Lt wrist extensors      Kinesiotix    Facilitate Muscle  plantaris, post tib, achilles bilateral, Lt wrist extensors      Ankle Exercises: Stretches   Soleus Stretch 3 reps;20 seconds    Gastroc Stretch 3 reps;20 seconds      Ankle Exercises: Standing   Rocker Board 3 minutes                  PT Education - 04/07/20 1550    Education Details Access Code: [email or web address]    Person(s) Educated Patient    Methods Explanation;Demonstration;Handout    Comprehension Verbalized understanding;Returned demonstration            PT Short Term Goals - 03/29/20 1030      PT SHORT TERM GOAL #1   Title be independent in initial HEP    Status Achieved             PT Long Term Goals - 03/29/20 1532      PT LONG TERM GOAL #1   Title be independent in advanced HEP    Time 12    Period Weeks    Status On-going    Target Date 06/21/20      PT LONG TERM GOAL #2   Title Pt will be able to walk at least 1 mile without increased pain so she can return to birding    Time 12    Period Weeks    Status Revised    Target Date 06/21/20      PT LONG TERM GOAL #3   Title report 75% less neck/UE pain throughout the day with normal functional activities    Time 12    Period Weeks    Status New    Target Date 06/21/20      PT LONG TERM GOAL #4   Title Pt will be able to hold her grandchildren for at least 1 hour throughout the day in order to help her daughter with child care    Baseline increases pain    Time 12    Period Weeks    Status Revised    Target Date 06/21/20      PT LONG TERM GOAL #5   Title Pt will demonstrate at least 4+/5 wrist and hip strength for improved stability for walking and lifting    Time 12    Period Weeks    Status Revised    Target Date 06/21/20  Plan - 04/07/20 1627    Clinical Impression Statement Pt arrived with Lt forearm pain, bil foot and knee pain.  Session focused on manual to bil feet and Lt arm and application of kinesiotape.  PT added exercises for Lt  forearm and radial nerve glide.  Pt with tenderness over bil medial ankles and plantar fascia and invertors.  Tenderness and trigger points in Lt wrist extensors and distal triceps.  Pt will continue to benefit from skilled PT to address pain associated with polymyalgia.    PT Frequency 1x / week    PT Duration 12 weeks    PT Treatment/Interventions ADLs/Self Care Home Management;Cryotherapy;Electrical Stimulation;Iontophoresis 53m/ml Dexamethasone;Moist Heat;Ultrasound;Neuromuscular re-education;Therapeutic exercise;Therapeutic activities;Patient/family education;Manual techniques;Dry needling;Passive range of motion;Taping    PT Next Visit Plan review HEP issued today, continue manual, flexibility and kinesiotape as needed.    PT Home Exercise Plan Access Code: [email or web address]    Consulted and Agree with Plan of Care Patient           Patient will benefit from skilled therapeutic intervention in order to improve the following deficits and impairments:  Abnormal gait,Increased fascial restricitons,Difficulty walking,Decreased range of motion,Pain,Decreased strength,Postural dysfunction  Visit Diagnosis: Polymyalgia rheumatica (HCC)  Muscle weakness (generalized)  Cramp and spasm  Pain in right foot  Pain in left foot     Problem List Patient Active Problem List   Diagnosis Date Noted  . GAD (generalized anxiety disorder) 12/26/2017  . OCD (obsessive compulsive disorder) 12/26/2017  . PTSD (post-traumatic stress disorder) 12/26/2017  . DDD (degenerative disc disease), cervical 07/14/2016  . Primary osteoarthritis of both feet 07/14/2016  . Primary osteoarthritis of both hands 07/14/2016  . Other fatigue 07/14/2016  . History of IBS 07/14/2016  . Osteopenia of multiple sites 07/14/2016  . Fibromyalgia 01/13/2016  . MGUS (monoclonal gammopathy of unknown significance) 12/23/2013  . Chronic cholecystitis without calculus 10/18/2012  . Abdominal pain, unspecified site 10/18/2012  .  Nausea alone 10/18/2012  . Unspecified constipation 10/18/2012  . Depression 10/18/2012  . Anxiety   . ASCUS (atypical squamous cells of undetermined significance) on Pap smear   . IUD   . Hemorrhoids 11/29/2010  . Abdominal pain, left upper quadrant 11/29/2010     KSigurd Sos PT 04/07/20 4:31 PM  [facility] [address] [place] [address]Phone: [phone]  Fax:  [phone] Name: Danielle CURLESSMRN: [id number] of Birth: [birth year]

## 2020-04-13 ENCOUNTER — Telehealth: Payer: Self-pay | Admitting: Psychiatry

## 2020-04-13 ENCOUNTER — Encounter: Payer: Self-pay | Admitting: Physician Assistant

## 2020-04-13 ENCOUNTER — Ambulatory Visit (INDEPENDENT_AMBULATORY_CARE_PROVIDER_SITE_OTHER): Payer: Medicare Other | Admitting: Physician Assistant

## 2020-04-13 ENCOUNTER — Other Ambulatory Visit: Payer: Self-pay | Admitting: Psychiatry

## 2020-04-13 ENCOUNTER — Other Ambulatory Visit: Payer: Self-pay

## 2020-04-13 VITALS — BP 144/76 | HR 73 | Resp 14 | Ht 62.0 in | Wt 149.0 lb

## 2020-04-13 DIAGNOSIS — F331 Major depressive disorder, recurrent, moderate: Secondary | ICD-10-CM

## 2020-04-13 DIAGNOSIS — M19042 Primary osteoarthritis, left hand: Secondary | ICD-10-CM

## 2020-04-13 DIAGNOSIS — M7061 Trochanteric bursitis, right hip: Secondary | ICD-10-CM | POA: Diagnosis not present

## 2020-04-13 DIAGNOSIS — M8589 Other specified disorders of bone density and structure, multiple sites: Secondary | ICD-10-CM | POA: Diagnosis not present

## 2020-04-13 DIAGNOSIS — D472 Monoclonal gammopathy: Secondary | ICD-10-CM

## 2020-04-13 DIAGNOSIS — M62838 Other muscle spasm: Secondary | ICD-10-CM | POA: Diagnosis not present

## 2020-04-13 DIAGNOSIS — M19071 Primary osteoarthritis, right ankle and foot: Secondary | ICD-10-CM

## 2020-04-13 DIAGNOSIS — R5383 Other fatigue: Secondary | ICD-10-CM

## 2020-04-13 DIAGNOSIS — M19072 Primary osteoarthritis, left ankle and foot: Secondary | ICD-10-CM

## 2020-04-13 DIAGNOSIS — F902 Attention-deficit hyperactivity disorder, combined type: Secondary | ICD-10-CM

## 2020-04-13 DIAGNOSIS — M19041 Primary osteoarthritis, right hand: Secondary | ICD-10-CM | POA: Diagnosis not present

## 2020-04-13 DIAGNOSIS — Z8719 Personal history of other diseases of the digestive system: Secondary | ICD-10-CM

## 2020-04-13 DIAGNOSIS — Z8659 Personal history of other mental and behavioral disorders: Secondary | ICD-10-CM | POA: Diagnosis not present

## 2020-04-13 DIAGNOSIS — R202 Paresthesia of skin: Secondary | ICD-10-CM

## 2020-04-13 DIAGNOSIS — M797 Fibromyalgia: Secondary | ICD-10-CM | POA: Diagnosis not present

## 2020-04-13 DIAGNOSIS — G4709 Other insomnia: Secondary | ICD-10-CM | POA: Diagnosis not present

## 2020-04-13 DIAGNOSIS — M5030 Other cervical disc degeneration, unspecified cervical region: Secondary | ICD-10-CM | POA: Diagnosis not present

## 2020-04-13 DIAGNOSIS — K811 Chronic cholecystitis: Secondary | ICD-10-CM | POA: Diagnosis not present

## 2020-04-13 DIAGNOSIS — K52832 Lymphocytic colitis: Secondary | ICD-10-CM | POA: Diagnosis not present

## 2020-04-13 DIAGNOSIS — M7062 Trochanteric bursitis, left hip: Secondary | ICD-10-CM

## 2020-04-13 MED ORDER — LIDOCAINE HCL 1 % IJ SOLN
0.5000 mL | INTRAMUSCULAR | Status: AC | PRN
  Administered 2020-04-13: .5 mL

## 2020-04-13 MED ORDER — METHYLPHENIDATE HCL 10 MG PO TABS: 10.0000 mg | ORAL_TABLET | Freq: Every day | ORAL | 0 refills | Status: DC

## 2020-04-13 MED ORDER — TRIAMCINOLONE ACETONIDE 40 MG/ML IJ SUSP
10.0000 mg | INTRAMUSCULAR | Status: AC | PRN
  Administered 2020-04-13: 10 mg via INTRAMUSCULAR

## 2020-04-13 MED ORDER — METHOCARBAMOL 500 MG PO TABS: 500.0000 mg | ORAL_TABLET | Freq: Every day | ORAL | 1 refills | Status: DC | PRN

## 2020-04-13 NOTE — Progress Notes (Signed)
Office Visit Note  Patient: Danielle Bruce             Date of Birth: December 09, 1960           MRN: [id number]             PCP: Mayra Neer, MD Referring: Mayra Neer, MD Visit Date: 04/13/2020 Occupation: @GUAROCC @  Subjective:  Generalized pain   History of Present Illness: Danielle Bruce is a 60 y.o. female with history of fibromyalgia, osteoarthritis, and DDD.  She presents today having a fibromyalgia flare.  She has been under tremendous amount of stress over the past several weeks and has not been sleeping well which she feels has exacerbated her pain.  She has been babysitting her twin grandsons on a daily basis and has often been sleeping in the same bed as then.  She has had interrupted sleep at night as well as difficulty falling asleep at night due to the severity of pain she has been experiencing.  She is taking lyrica 50 mg 2 tablets at bedtime.  She takes methocarbamol 500 mg 1 tablet by mouth in the morning as needed and Flexeril 10 mg by mouth at bedtime as needed for muscle spasms.  She takes Percocet very sparingly for pain relief.  She does not like to have to take medications to manage her pain but has had to recently due to the flare she is experiencing.  She is also been using heat, Voltaren gel, and her TENS unit.  She has been going to physical therapy once a week which has been improving some of her discomfort.  She presents today with trapezius muscle tension and tenderness.   She continues to have muscle spasms intermittently. Her increased pain and fatigue have also started to worsen her anxiety and depression. She has an upcoming appointment with her psychiatrist.   Activities of Daily Living:  Patient reports morning stiffness for 5-10 minutes.   Patient Reports nocturnal pain.  Difficulty dressing/grooming: Denies Difficulty climbing stairs: Reports Difficulty getting out of chair: Reports Difficulty using hands for taps, buttons, cutlery, and/or writing:  Reports  Review of Systems  Constitutional: Positive for fatigue.  HENT: Positive for mouth dryness. Negative for mouth sores and nose dryness.   Eyes: Positive for itching. Negative for pain, visual disturbance and dryness.  Respiratory: Negative for cough, hemoptysis, shortness of breath and difficulty breathing.   Cardiovascular: Negative for chest pain, palpitations, hypertension and swelling in legs/feet.  Gastrointestinal: Positive for constipation and diarrhea. Negative for blood in stool.  Endocrine: Negative for increased urination.  Genitourinary: Negative for difficulty urinating and painful urination.  Musculoskeletal: Positive for arthralgias, joint pain, joint swelling, myalgias, muscle weakness, morning stiffness, muscle tenderness and myalgias.  Skin: Negative for color change, pallor, hair loss, nodules/bumps, skin tightness, ulcers and sensitivity to sunlight.  Allergic/Immunologic: Negative for susceptible to infections.  Neurological: Positive for numbness and headaches. Negative for dizziness.  Hematological: Negative for swollen glands.  Psychiatric/Behavioral: Positive for depressed mood and sleep disturbance. The patient is nervous/anxious.     PMFS History:  Patient Active Problem List   Diagnosis Date Noted  . GAD (generalized anxiety disorder) 12/26/2017  . OCD (obsessive compulsive disorder) 12/26/2017  . PTSD (post-traumatic stress disorder) 12/26/2017  . DDD (degenerative disc disease), cervical 07/14/2016  . Primary osteoarthritis of both feet 07/14/2016  . Primary osteoarthritis of both hands 07/14/2016  . Other fatigue 07/14/2016  . History of IBS 07/14/2016  . Osteopenia of multiple sites  07/14/2016  . Fibromyalgia 01/13/2016  . MGUS (monoclonal gammopathy of unknown significance) 12/23/2013  . Chronic cholecystitis without calculus 10/18/2012  . Abdominal pain, unspecified site 10/18/2012  . Nausea alone 10/18/2012  . Unspecified constipation  10/18/2012  . Depression 10/18/2012  . Anxiety   . ASCUS (atypical squamous cells of undetermined significance) on Pap smear   . IUD   . Hemorrhoids 11/29/2010  . Abdominal pain, left upper quadrant 11/29/2010    Past Medical History:  Diagnosis Date  . Anemia   . Anxiety   . Arthritis    osteoarthritis  . ASCUS (atypical squamous cells of undetermined significance) on Pap smear 05/06/05   NEG HIGH RISK HPV--C&B BIOPSY BENIGN 12/2005  . Asthma   . Bipolar 2 disorder ([place])   . Cancer ([place])    skin cancer - basal cell  . Colon polyps   . Complication of anesthesia    anxious afterwards, will get headaches   . Constipation   . Depression   . Fibromyalgia 10/2013  . GERD (gastroesophageal reflux disease)   . 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 Heart murmur    never had any problems  . Hemorrhoids   . High cholesterol   . 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   cytology negative  . IBS (irritable bowel syndrome)   . Insomnia   . Lymphocytic colitis   . Lymphoma ([place])   . 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 Migraines   . Osteoarthritis   . Osteopenia   . Peripheral neuropathy   . PTSD (post-traumatic stress disorder)     Family History  Problem Relation Age of Onset  . Diabetes Father   . Hypertension Father   . Hyperlipidemia Father   . Heart disease Maternal Grandfather   . Heart disease Paternal Grandmother   . Heart disease Paternal Grandfather   . Depression Son   . Depression Son   . Anxiety disorder Son   . Depression Daughter   . Anxiety disorder Daughter   . Asthma Daughter    Past Surgical History:  Procedure Laterality Date  . BONE MARROW BIOPSY Left 12/18/2013   Plasma cell dyscrasia 8% population of plasma cells  . BREAST BIOPSY Right    benign stereo  . CESAREAN SECTION  [phone]  . CHOLECYSTECTOMY N/A 10/16/2012   Procedure: LAPAROSCOPIC CHOLECYSTECTOMY;  Surgeon: Joyice Faster. Cornett, MD;  Location: [facility];  Service: General;  Laterality: N/A;  . COLONOSCOPY     numerous times  . DILATION AND CURETTAGE OF UTERUS    . ESOPHAGOGASTRODUODENOSCOPY    . [place]   x3  . IUD REMOVAL  02/2015   [place]  . LAPAROSCOPIC LYSIS OF ADHESIONS N/A 10/16/2012   Procedure: LAPAROSCOPIC LYSIS OF ADHESIONS;  Surgeon: Joyice Faster. Cornett, MD;  Location: [facility];  Service: General;  Laterality: N/A;  . LAPAROSCOPY N/A 10/16/2012   Procedure: LAPAROSCOPY DIAGNOSTIC;  Surgeon: Joyice Faster. Cornett, MD;  Location: [facility];  Service: General;  Laterality: N/A;  . PELVIC LAPAROSCOPY    . RADIOLOGY WITH ANESTHESIA N/A 12/16/2015   Procedure: MRI OF BRAIN WITH AND WITHOUT CONTRAST;  Surgeon: Medication Radiologist, MD;  Location: [facility];  Service: Radiology;  Laterality: N/A;  . SHOULDER SURGERY  2007/2008  . SPINE SURGERY  2010   cervical   Social History   Social History Narrative  . Not on file  Immunization History  Administered Date(s) Administered  . Influenza-Unspecified 11/20/2013  . PFIZER(Purple Top)SARS-COV-2 Vaccination 05/15/2019, 06/09/2019, 02/11/2020     Objective: Vital Signs: BP (!) 144/76 (BP Location: Left Arm, Patient Position: Sitting, Cuff Size: Normal)   Pulse 73   Resp 14   Ht 5' 2"  (1.575 m)   Wt 149 lb (67.6 kg)   LMP 12/28/2010   BMI 27.25 kg/m    Physical Exam Vitals and nursing note reviewed.  Constitutional:      Appearance: She is well-developed and well-nourished.  HENT:     Head: Normocephalic and atraumatic.  Eyes:     Extraocular Movements: EOM normal.     Conjunctiva/sclera: Conjunctivae normal.  Cardiovascular:     Pulses: Intact distal pulses.  Pulmonary:     Effort: Pulmonary effort is normal.  Abdominal:     Palpations: Abdomen is soft.  Musculoskeletal:     Cervical back: Normal range of motion.  Skin:    General: Skin is warm and dry.     Capillary Refill: Capillary refill takes less than 2 seconds.   Neurological:     Mental Status: She is alert and oriented to person, place, and time.  Psychiatric:        Mood and Affect: Mood and affect normal.        Behavior: Behavior normal.      Musculoskeletal Exam: Generalized hyperalgesia and positive tender points on exam.  C-spine has limited range of motion with lateral rotation.  Trapezius muscle tension and muscle tenderness noted bilaterally.  Thoracic and lumbar spine have good range of motion.  Shoulder joints, elbow joints, wrist joints, MCPs, PIPs, DIPs have good range of motion with no synovitis.  Hip joints have good range of motion with no discomfort.  She has tenderness palpation over bilateral trochanteric bursa.  Knee joints have good range of motion with no warmth or effusion.  Ankle joints have good range of motion no tenderness or inflammation.  She has tenderness over the left second MTP and right fourth MTP joint but no joint swelling was noted.   CDAI Exam: CDAI Score: - Patient Global: -; Provider Global: - Swollen: -; Tender: - Joint Exam 04/13/2020   No joint exam has been documented for this visit   There is currently no information documented on the homunculus. Go to the Rheumatology activity and complete the homunculus joint exam.  Investigation: No additional findings.  Imaging: CT CHEST ABDOMEN PELVIS W CONTRAST  Result Date: 03/26/2020 CLINICAL DATA:  Left lower quadrant pain. Hypoxia for 2 months. COVID in August 2021 in December 2021. EXAM: CT CHEST, ABDOMEN, AND PELVIS WITH CONTRAST TECHNIQUE: Multidetector CT imaging of the chest, abdomen and pelvis was performed following the standard protocol during bolus administration of intravenous contrast. CONTRAST:  163m ISOVUE-300 IOPAMIDOL (ISOVUE-300) INJECTION 61% COMPARISON:  Abdominal ultrasound 12/31/2019. Abdominal CT 12/21/2017 FINDINGS: CT CHEST FINDINGS Cardiovascular: Normal caliber thoracic aorta. Exam not tailored for pulmonary artery evaluation, there  is no pulmonary embolus to the proximal segmental level. The heart is normal in size. Trace pericardial fluid anteriorly. Mediastinum/Nodes: No enlarged mediastinal or hilar lymph nodes. Esophagus slightly patulous but no wall thickening. There is no axillary adenopathy. No thyroid nodule Lungs/Pleura: Minimal subpleural reticulation primarily in the lower lobes. Minimal subsegmental opacity in the lingula and both lower lobes, typical of atelectasis or scarring. There is no pulmonary mass. Punctate calcified granuloma in the right lower lobe, series 4, image 95, unchanged from prior. No findings of pulmonary  edema. The trachea and central bronchi are patent. No pleural fluid. Musculoskeletal: No chest wall mass or suspicious bone lesions identified. CT ABDOMEN PELVIS FINDINGS Hepatobiliary: Mild decreased hepatic density consistent with steatosis. No suspicious hepatic lesion. Punctate low-density structure in the right lobe of the liver on prior exam is not well seen currently. Cholecystectomy without biliary dilatation. Pancreas: No ductal dilatation or inflammation. No evidence of pancreatic mass. Spleen: Normal in size without focal abnormality. Adrenals/Urinary Tract: Normal adrenal glands. No hydronephrosis or perinephric edema. Homogeneous renal enhancement with symmetric excretion on delayed phase imaging. Tiny subcentimeter cortical cysts in the kidneys unchanged. Urinary bladder is physiologically distended without wall thickening. Stomach/Bowel: The stomach is unremarkable. Normal positioning of the duodenum and ligament of Treitz. Normal small bowel without wall thickening, obstruction or inflammation. Normal appendix courses posteriorly. Moderate volume of stool throughout the colon. There is mild transverse colonic tortuosity. No colonic wall thickening or pericolonic inflammation. No evidence of colonic mass. No significant diverticular disease. Vascular/Lymphatic: Normal caliber abdominal aorta.  Retroaortic left renal vein. Patent portal vein. No acute vascular findings. No abdominopelvic adenopathy. Reproductive: Uterus and bilateral adnexa are unremarkable. Other: Tiny fat containing umbilical hernia.  No ascites. Musculoskeletal: No acute or significant osseous findings. IMPRESSION: 1. Minimal subpleural reticulation in the lungs, basilar predominant. This is nonspecific, may represent hypoventilatory change or mild interstitial lung disease. Consider high-resolution chest CT. 2. Moderate colonic stool burden with colonic tortuosity, suggesting constipation. No other explanation for abdominal pain. 3. Mild hepatic steatosis. Electronically Signed   By: Keith Rake M.D.   On: 03/26/2020 21:25    Recent Labs: Lab Results  Component Value Date   WBC 5.9 08/01/2016   HGB 14.6 08/01/2016   PLT 254 08/01/2016   NA 142 08/01/2016   K 4.7 08/01/2016   CL 107 08/01/2016   CO2 23 08/01/2016   GLUCOSE 95 08/01/2016   BUN 14 08/01/2016   CREATININE 0.87 08/01/2016   BILITOT 0.7 08/01/2016   ALKPHOS 55 08/01/2016   AST 23 08/01/2016   ALT 26 08/01/2016   PROT 7.4 08/01/2016   ALBUMIN 4.8 08/01/2016   CALCIUM 10.0 08/01/2016   GFRAA 88 08/01/2016    Speciality Comments: No specialty comments available.  Procedures:  Trigger Point Inj  Date/Time: 04/13/2020 3:32 PM Performed by: Ofilia Neas, PA-C Authorized by: Ofilia Neas, PA-C   Consent Given by:  Patient Site marked: the procedure site was marked   Timeout: prior to procedure the correct patient, procedure, and site was verified   Indications:  Pain Total # of Trigger Points:  2 Location: neck   Needle Size:  27 G Approach:  Dorsal Medications #1:  0.5 mL lidocaine 1 %; 10 mg triamcinolone acetonide 40 MG/ML Medications #2:  0.5 mL lidocaine 1 %; 10 mg triamcinolone acetonide 40 MG/ML Patient tolerance:  Patient tolerated the procedure well with no immediate complications   Allergies: Sulfa antibiotics,  Gluten meal, Lactose intolerance (gi), Flagyl [metronidazole hcl], and Morphine and related   Assessment / Plan:     Visit Diagnoses: Fibromyalgia: She has generalized hyperalgesia and positive tender points on exam.  She presents today having a fibromyalgia flare.  She has generalized myalgias and muscle tenderness which started over 1 week ago.  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  She has had interrupted sleep at night since she has been having to sleep with the  twins.  She is also having difficulty falling asleep due to severity of her pain.  She continues to take trazodone 50 mg 1 tablet by mouth at bedtime, and she is also taking Lyrica 50 mg 2 capsules by mouth at bedtime.  Over the past several weeks she has not been following a strict diet and has been eating more gluten and processed sugars which she feels has also exacerbated her pain and joint stiffness.  We discussed going to eat more plant-based and avoiding processed sugars.  We also discussed trying to meal prep at the beginning of the week she will have more regular meals while babysitting.  We also discussed trying to eliminate caffeine throughout the day.  During fibromyalgia flares she takes methocarbamol 500 mg 1 tablet by mouth daily as needed and Flexeril 10 mg by mouth at bedtime as needed for muscle spasms.  She has been taking Percocet very sparingly (1 to 2 times per month as needed) for pain relief.  She is also tried heat, Voltaren gel, and using her TENS unit.  She has also been going to physical therapy once a week which has been alleviating some of her generalized pain.  She presents today with trapezius muscle tension and muscle tenderness bilaterally.  She requested trigger point injections.  She tolerated procedure well.  Procedure note was completed above. We discussed the importance of regular exercise and good sleep hygiene in  detail.  We also discussed the importance of self-care and stress management.  She has an upcoming appointment with her psychiatrist to discuss worsening depression and anxiety due to the severity of her pain.  She was given an informational handout about myofascial pain and fibromyalgia to review.  She was also given the link for fibromyalgia patient resources on [employer] [place] site.   She will follow up in the office in 3 months.   Trapezius muscle spasm -She presents today with trapezius muscle tension and muscle tenderness bilaterally.  She has been experiencing muscle spasms over the past several days.  She has tried using a heating pad as well as a TENS unit.  She requested trigger point injections today.  She tolerated procedure well.  Procedure note was completed above.  Aftercare was discussed.  A refill of methocarbamol 500 mg 1 tablet by mouth daily as needed for muscle spasms was sent to the pharmacy.  We discussed the importance of spacing the dosing of methocarbamol and Flexeril.  She plans on taking both medications very sparingly during flares.  Plan: Trigger Point Inj  Other fatigue: She has been experiencing significant fatigue secondary to insomnia.  We discussed the importance of regular exercise and good sleep hygiene.  She was encouraged to continue to go to physical therapy as directed.  Other insomnia: She takes trazodone 50 mg 1 tablet by mouth at bedtime for insomnia. Discussed the importance of good sleep hygiene in detail.  Discussed the use of chamomile tea and essential oils.  She was strongly encouraged to try to go to sleep at the same time every night and wake up at the same time every morning.   Primary osteoarthritis of both hands: She has no joint tenderness or inflammation on exam.  She is able to make a complete fist bilaterally.  Primary osteoarthritis of both feet: She experiences intermittent discomfort in both feet.  She has tenderness palpation over the  left second MTP and right fourth MTP joint but no inflammation was noted.  Discussed the importance of  wearing proper fitting shoes.  She has been going to physical therapy once a week which has alleviated some of the discomfort in her feet.  Trochanteric bursitis of both hips: She has tenderness palpation over bilateral trochanteric bursa.  She was encouraged to perform stretching exercises on a daily basis.  DDD (degenerative disc disease), cervical: She has limited ROM with lateral rotation bilaterally.  Trapezius muscle tension and tenderness bilaterally.  Bilateral trigger point injections were performed today. Procedure note completed above.   Osteopenia of multiple sites - She receives Prolia 60 mg subcutaneous injections every 6 months by her PCP.  She is taking a daily vitamin D supplement.   Right hand paresthesia: She continues to wear a carpal tunnel night splint.   Chronic cholecystitis without calculus  Lymphocytic colitis  History of posttraumatic stress disorder (PTSD)  History of IBS  MGUS (monoclonal gammopathy of unknown significance)    Orders: Orders Placed This Encounter  Procedures  . Trigger Point Inj   Meds ordered this encounter  Medications  . methocarbamol (ROBAXIN) 500 MG tablet    Sig: Take 1 tablet (500 mg total) by mouth daily as needed for muscle spasms.    Dispense:  30 tablet    Refill:  1     Follow-Up Instructions: Return in about 3 months (around 07/11/2020) for Fibromyalgia, Osteoarthritis, DDD.   Ofilia Neas, PA-C  Note - This record has been created using Dragon software.  Chart creation errors have been sought, but may not always  have been located. Such creation errors do not reflect on  the standard of medical care.

## 2020-04-13 NOTE — Patient Instructions (Addendum)
[email or web address]    Myofascial Pain Syndrome and Fibromyalgia Myofascial pain syndrome and fibromyalgia are both pain disorders. This pain may be felt mainly in your muscles.  Myofascial pain syndrome: ? Always has tender points in the muscle that will cause pain when pressed (trigger points). The pain may come and go. ? Usually affects your neck, upper back, and shoulder areas. The pain often radiates into your arms and hands.  Fibromyalgia: ? Has muscle pains and tenderness that come and go. ? Is often associated with fatigue and sleep problems. ? Has trigger points. ? Tends to be long-lasting (chronic), but is not life-threatening. Fibromyalgia and myofascial pain syndrome are not the same. However, they often occur together. If you have both conditions, each can make the other worse. Both are common and can cause enough pain and fatigue to make day-to-day activities difficult. Both can be hard to diagnose because their symptoms are common in many other conditions. What are the causes? The exact causes of these conditions are not known. What increases the risk? You are more likely to develop this condition if:  You have a family history of the condition.  You have certain triggers, such as: ? Spine disorders. ? An injury (trauma) or other physical stressors. ? Being under a lot of stress. ? Medical conditions such as osteoarthritis, rheumatoid arthritis, or lupus. What are the signs or symptoms? Fibromyalgia The main symptom of fibromyalgia is widespread pain and tenderness in your muscles. Pain is sometimes described as stabbing, shooting, or burning. You may also have:  Tingling or numbness.  Sleep problems and fatigue.  Problems with attention and concentration (fibro fog). Other symptoms may include:  Bowel and bladder problems.  Headaches.  Visual problems.  Problems with odors and noises.  Depression or mood  changes.  Painful menstrual periods (dysmenorrhea).  Dry skin or eyes. These symptoms can vary over time. Myofascial pain syndrome Symptoms of myofascial pain syndrome include:  Tight, ropy bands of muscle.  Uncomfortable sensations in muscle areas. These may include aching, cramping, burning, numbness, tingling, and weakness.  Difficulty moving certain parts of the body freely (poor range of motion). How is this diagnosed? This condition may be diagnosed by your symptoms and medical history. You will also have a physical exam. In general:  Fibromyalgia is diagnosed if you have pain, fatigue, and other symptoms for more than 3 months, and symptoms cannot be explained by another condition.  Myofascial pain syndrome is diagnosed if you have trigger points in your muscles, and those trigger points are tender and cause pain elsewhere in your body (referred pain). How is this treated? Treatment for these conditions depends on the type that you have.  For fibromyalgia: ? Pain medicines, such as NSAIDs. ? Medicines for treating depression. ? Medicines for treating seizures. ? Medicines that relax the muscles.  For myofascial pain: ? Pain medicines, such as NSAIDs. ? Cooling and stretching of muscles. ? Trigger point injections. ? Sound wave (ultrasound) treatments to stimulate muscles. Treating these conditions often requires a team of health care providers. These may include:  Your primary care provider.  Physical therapist.  Complementary health care providers, such as massage therapists or acupuncturists.  Psychiatrist for cognitive behavioral therapy.   Follow these instructions at home: Medicines  Take over-the-counter and prescription medicines only as told by your health care provider.  Do not drive or use heavy machinery while taking prescription pain medicine.  If you are taking prescription pain medicine, take actions to prevent  or treat constipation. Your health  care provider may recommend that you: ? Drink enough fluid to keep your urine pale yellow. ? Eat foods that are high in fiber, such as fresh fruits and vegetables, whole grains, and beans. ? Limit foods that are high in fat and processed sugars, such as fried or sweet foods. ? Take an over-the-counter or prescription medicine for constipation. Lifestyle  Exercise as directed by your health care provider or physical therapist.  Practice relaxation techniques to control your stress. You may want to try: ? Biofeedback. ? Visual imagery. ? Hypnosis. ? Muscle relaxation. ? Yoga. ? Meditation.  Maintain a healthy lifestyle. This includes eating a healthy diet and getting enough sleep.  Do not use any products that contain nicotine or tobacco, such as cigarettes and e-cigarettes. If you need help quitting, ask your health care provider.   General instructions  Talk to your health care provider about complementary treatments, such as acupuncture or massage.  Consider joining a support group with others who are diagnosed with this condition.  Do not do activities that stress or strain your muscles. This includes repetitive motions and heavy lifting.  Keep all follow-up visits as told by your health care provider. This is important. Where to find more information  National Fibromyalgia Association: www.fmaware.[place]: www.arthritis.org  American Chronic Pain Association: www.theacpa.org Contact a health care provider if:  You have new symptoms.  Your symptoms get worse or your pain is severe.  You have side effects from your medicines.  You have trouble sleeping.  Your condition is causing depression or anxiety. Summary  Myofascial pain syndrome and fibromyalgia are pain disorders.  Myofascial pain syndrome has tender points in the muscle that will cause pain when pressed (trigger points). Fibromyalgia also has muscle pains and tenderness that come and go,  but this condition is often associated with fatigue and sleep disturbances.  Fibromyalgia and myofascial pain syndrome are not the same but often occur together, causing pain and fatigue that make day-to-day activities difficult.  Treatment for fibromyalgia includes taking medicines to relax the muscles and medicines for pain, depression, or seizures. Treatment for myofascial pain syndrome includes taking medicines for pain, cooling and stretching of muscles, and injecting medicines into trigger points.  Follow your health care provider's instructions for taking medicines and maintaining a healthy lifestyle. This information is not intended to replace advice given to you by your health care provider. Make sure you discuss any questions you have with your health care provider. Document Revised: 06/07/2018 Document Reviewed: 02/28/2017 Elsevier Patient Education  2021 [employer].

## 2020-04-13 NOTE — Telephone Encounter (Signed)
Next appt is 04/21/20. Requesting a refill on Methylphenidate called to [facility], [phone] and phone number is [phone].

## 2020-04-14 ENCOUNTER — Other Ambulatory Visit: Payer: Self-pay

## 2020-04-14 ENCOUNTER — Ambulatory Visit: Payer: Medicare Other

## 2020-04-14 DIAGNOSIS — M79671 Pain in right foot: Secondary | ICD-10-CM | POA: Diagnosis not present

## 2020-04-14 DIAGNOSIS — R293 Abnormal posture: Secondary | ICD-10-CM | POA: Diagnosis not present

## 2020-04-14 DIAGNOSIS — M353 Polymyalgia rheumatica: Secondary | ICD-10-CM | POA: Diagnosis not present

## 2020-04-14 DIAGNOSIS — M79672 Pain in left foot: Secondary | ICD-10-CM

## 2020-04-14 DIAGNOSIS — R252 Cramp and spasm: Secondary | ICD-10-CM | POA: Diagnosis not present

## 2020-04-14 DIAGNOSIS — M6281 Muscle weakness (generalized): Secondary | ICD-10-CM | POA: Diagnosis not present

## 2020-04-14 NOTE — Therapy (Signed)
[facility] [address] [place], [place], [address] Phone: [phone]   Fax:  [phone]  Physical Therapy Treatment  Patient Details  Name: Danielle Bruce MRN: [id number] Date of Birth: 1960/06/06 Referring Provider (PT): Mayra Neer, MD   Encounter Date: 04/14/2020 Progress Note Reporting Period 12/23/19 to 04/14/20  See note below for Objective Data and Assessment of Progress/Goals.       PT End of Session - 04/14/20 1318    Visit Number 10    Date for PT Re-Evaluation 06/21/20    Authorization Type Medicare/Medicaid    PT Start Time 1232    PT Stop Time 1316    PT Time Calculation (min) 44 min    Activity Tolerance Patient tolerated treatment well    Behavior During Therapy WFL for tasks assessed/performed           Past Medical History:  Diagnosis Date  . Anemia   . Anxiety   . Arthritis    osteoarthritis  . ASCUS (atypical squamous cells of undetermined significance) on Pap smear 05/06/05   NEG HIGH RISK HPV--C&B BIOPSY BENIGN 12/2005  . Asthma   . Bipolar 2 disorder ([place])   . Cancer ([place])    skin cancer - basal cell  . Colon polyps   . Complication of anesthesia    anxious afterwards, will get headaches   . Constipation   . Depression   . Fibromyalgia 10/2013  . GERD (gastroesophageal reflux disease)   . 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 Heart murmur    never had any problems  . Hemorrhoids   . High cholesterol   . 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   cytology negative  . IBS (irritable bowel syndrome)   . Insomnia   . Lymphocytic colitis   . Lymphoma ([place])   . 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 Migraines   . Osteoarthritis   . Osteopenia   . Peripheral neuropathy   . PTSD (post-traumatic stress disorder)     Past Surgical History:  Procedure Laterality Date  . BONE MARROW BIOPSY Left  12/18/2013   Plasma cell dyscrasia 8% population of plasma cells  . BREAST BIOPSY Right    benign stereo  . CESAREAN SECTION  [id number]  . CHOLECYSTECTOMY N/A 10/16/2012   Procedure: LAPAROSCOPIC CHOLECYSTECTOMY;  Surgeon: Joyice Faster. Cornett, MD;  Location: [facility];  Service: General;  Laterality: N/A;  . COLONOSCOPY     numerous times  . DILATION AND CURETTAGE OF UTERUS    . ESOPHAGOGASTRODUODENOSCOPY    . [place]   x3  . IUD REMOVAL  02/2015   Mirena  . LAPAROSCOPIC LYSIS OF ADHESIONS N/A 10/16/2012   Procedure: LAPAROSCOPIC LYSIS OF ADHESIONS;  Surgeon: Joyice Faster. Cornett, MD;  Location: [facility];  Service: General;  Laterality: N/A;  . LAPAROSCOPY N/A 10/16/2012   Procedure: LAPAROSCOPY DIAGNOSTIC;  Surgeon: Joyice Faster. Cornett, MD;  Location: [facility];  Service: General;  Laterality: N/A;  . PELVIC LAPAROSCOPY    . RADIOLOGY WITH ANESTHESIA N/A 12/16/2015   Procedure: MRI OF BRAIN WITH AND WITHOUT CONTRAST;  Surgeon: Medication Radiologist, MD;  Location: [place];  Service: Radiology;  Laterality: N/A;  . SHOULDER SURGERY  2007/2008  . SPINE SURGERY  2010   cervical    There were no vitals filed for this visit.   Subjective Assessment - 04/14/20  1232    Subjective I had a fibrymyalgia flare-up and had to see my rheumatologist yesterday.  I got trigger point injections in bil upper traps and that area feels better.    Pertinent History goes by "[name]" ; OA bilateral feet; fibromyalgia; off cycle of steriod medicine currenlty    Currently in Pain? Yes    Pain Score 3     Pain Location Foot   Lt forearm: 2/10 at rest, 4-5/10 with use/movement   Pain Orientation Left    Pain Descriptors / Indicators Aching    Pain Type Chronic pain    Pain Onset More than a month ago    Pain Frequency Intermittent    Aggravating Factors  walking and moving    Pain Relieving Factors rest, stretching              [facility] PT Assessment - 04/14/20 0001      Assessment   Medical  Diagnosis M77.41,M77.42 (ICD-10-CM) - Metatarsalgia of both feet; M35.3 (ICD-10-CM) - Polymyalgia rheumatica    Referring Provider (PT) Mayra Neer, MD      AROM   Overall AROM Comments lumbar fwd flex hands to just above ankles lumbar tight      Strength   Right Elbow Flexion 5/5    Right Elbow Extension 5/5    Left Elbow Flexion 4/5   some pain with palm down   Right Wrist Extension 5/5    Left Wrist Extension 4/5   pain   Right Hand Grip (lbs) 29    Left Hand Grip (lbs) 20    Left Hip Flexion 4+/5    Left Hip Extension 4+/5    Left Hip ABduction 4/5    Left Hip ADduction 4/5    Left Ankle Dorsiflexion 4+/5    Left Ankle Eversion 4+/5      Palpation   Palpation comment lumbar and thoracic tight, low tone abdominals, TTP lateral Rt foot; some popping Lt hip with MMT; Lt wrist extensors and supinator tight and TTP                         [facility] Adult PT Treatment/Exercise - 04/14/20 0001      Wrist Exercises   Wrist Flexion Self ROM;Left;5 reps;Seated    Wrist Extension Self ROM;5 reps;Seated      Manual Therapy   Manual Therapy Taping    Soft tissue mobilization gentle STM to peroneals and posterior tib, Lt forearm mobilzation    Myofascial Release Lt wrist extensors, bil medial ankle musculature.  P/ROM into inversion bilaterally and DF with plantar fascia stretch.      Kinesiotix   Facilitate Muscle  plantaris, post tib, achilles bilateral, Lt wrist extensors      Ankle Exercises: Stretches   Soleus Stretch 3 reps;20 seconds    Gastroc Stretch 3 reps;20 seconds                    PT Short Term Goals - 03/29/20 1030      PT SHORT TERM GOAL #1   Title be independent in initial HEP    Status Achieved             PT Long Term Goals - 04/14/20 1240      PT LONG TERM GOAL #1   Title be independent in advanced HEP    Time 12    Period Weeks    Status On-going      PT  LONG TERM GOAL #2   Title Pt will be able to walk at least 1  mile without increased pain so she can return to birding    Baseline still wearing shoes but is walking on hardwoods    Time 12    Period Weeks    Status On-going      PT LONG TERM GOAL #3   Title report 75% less neck/UE pain throughout the day with normal functional activities    Baseline reports no issues with daily headaches anymore    Status On-going      PT LONG TERM GOAL #4   Title Pt will be able to hold her grandchildren for at least 1 hour throughout the day in order to help her daughter with child care    Status On-going      PT LONG TERM GOAL #5   Title Pt will demonstrate at least 4+/5 wrist and hip strength for improved stability for walking and lifting    Status On-going                 Plan - 04/14/20 1244    Clinical Impression Statement Pt had a fibromyalgia flare-up late last week and pt went to her rheumatologist and got injections into bil upper traps. Pt arrived with Lt forearm pain, bil foot and knee pain.  Session focused on manual to bil feet and Lt arm and application of kinesiotape.  Pt is doing well with her wrist exercises.   Pt with tenderness over bil medial ankles and plantar fascia and invertors.  Tenderness and trigger points in Lt wrist extensors and distal triceps.  Pt has been caring for her twin grandchildren and this has been challenging for her. Progress is slow due to complexity of medical history.  Pt will continue to benefit from skilled PT to address pain associated with polymyalgia.    PT Frequency 1x / week    PT Duration 12 weeks    PT Treatment/Interventions ADLs/Self Care Home Management;Cryotherapy;Electrical Stimulation;Iontophoresis 28m/ml Dexamethasone;Moist Heat;Ultrasound;Neuromuscular re-education;Therapeutic exercise;Therapeutic activities;Patient/family education;Manual techniques;Dry needling;Passive range of motion;Taping    PT Next Visit Plan review HEP issued today, continue manual, flexibility and kinesiotape as needed.     PT Home Exercise Plan Access Code: [email or web address]           Patient will benefit from skilled therapeutic intervention in order to improve the following deficits and impairments:  Abnormal gait,Increased fascial restricitons,Difficulty walking,Decreased range of motion,Pain,Decreased strength,Postural dysfunction  Visit Diagnosis: Polymyalgia rheumatica (HCC)  Muscle weakness (generalized)  Cramp and spasm  Pain in right foot  Pain in left foot  Abnormal posture     Problem List Patient Active Problem List   Diagnosis Date Noted  . GAD (generalized anxiety disorder) 12/26/2017  . OCD (obsessive compulsive disorder) 12/26/2017  . PTSD (post-traumatic stress disorder) 12/26/2017  . DDD (degenerative disc disease), cervical 07/14/2016  . Primary osteoarthritis of both feet 07/14/2016  . Primary osteoarthritis of both hands 07/14/2016  . Other fatigue 07/14/2016  . History of IBS 07/14/2016  . Osteopenia of multiple sites 07/14/2016  . Fibromyalgia 01/13/2016  . MGUS (monoclonal gammopathy of unknown significance) 12/23/2013  . Chronic cholecystitis without calculus 10/18/2012  . Abdominal pain, unspecified site 10/18/2012  . Nausea alone 10/18/2012  . Unspecified constipation 10/18/2012  . Depression 10/18/2012  . Anxiety   . ASCUS (atypical squamous cells of undetermined significance) on Pap smear   . IUD   . Hemorrhoids 11/29/2010  . Abdominal pain, left  upper quadrant 11/29/2010     Sigurd Sos, PT 04/14/20 1:20 PM  [facility] [address] [place], [place], [address] Phone: [phone]   Fax:  [phone]  Name: Danielle Bruce MRN: [id number] Date of Birth: [birth year]

## 2020-04-15 ENCOUNTER — Other Ambulatory Visit: Payer: Self-pay | Admitting: *Deleted

## 2020-04-16 NOTE — Telephone Encounter (Signed)
There is drug interaction between Luvox and tizanidine.  Please send a prescription for Skelaxin 400 mg p.o. nightly as needed.

## 2020-04-16 NOTE — Telephone Encounter (Signed)
I called patient.  She stated that she paid for the methocarbamol herself as it was not covered with insurance.  She does not need any other muscle relaxer now.

## 2020-04-21 ENCOUNTER — Ambulatory Visit: Payer: Medicare Other

## 2020-04-21 ENCOUNTER — Other Ambulatory Visit: Payer: Self-pay

## 2020-04-21 ENCOUNTER — Ambulatory Visit (INDEPENDENT_AMBULATORY_CARE_PROVIDER_SITE_OTHER): Payer: Medicare Other | Admitting: Psychiatry

## 2020-04-21 ENCOUNTER — Encounter: Payer: Self-pay | Admitting: Psychiatry

## 2020-04-21 VITALS — BP 139/88 | HR 74

## 2020-04-21 DIAGNOSIS — R252 Cramp and spasm: Secondary | ICD-10-CM

## 2020-04-21 DIAGNOSIS — F902 Attention-deficit hyperactivity disorder, combined type: Secondary | ICD-10-CM | POA: Diagnosis not present

## 2020-04-21 DIAGNOSIS — M6281 Muscle weakness (generalized): Secondary | ICD-10-CM

## 2020-04-21 DIAGNOSIS — F4310 Post-traumatic stress disorder, unspecified: Secondary | ICD-10-CM

## 2020-04-21 DIAGNOSIS — F411 Generalized anxiety disorder: Secondary | ICD-10-CM | POA: Diagnosis not present

## 2020-04-21 DIAGNOSIS — M79672 Pain in left foot: Secondary | ICD-10-CM | POA: Diagnosis not present

## 2020-04-21 DIAGNOSIS — R293 Abnormal posture: Secondary | ICD-10-CM

## 2020-04-21 DIAGNOSIS — F331 Major depressive disorder, recurrent, moderate: Secondary | ICD-10-CM

## 2020-04-21 DIAGNOSIS — M353 Polymyalgia rheumatica: Secondary | ICD-10-CM

## 2020-04-21 DIAGNOSIS — F4001 Agoraphobia with panic disorder: Secondary | ICD-10-CM

## 2020-04-21 DIAGNOSIS — M79671 Pain in right foot: Secondary | ICD-10-CM | POA: Diagnosis not present

## 2020-04-21 NOTE — Therapy (Signed)
[facility] [address] [place], [place], [address] Phone: [phone]   Fax:  [phone]  Physical Therapy Treatment  Patient Details  Name: Danielle Bruce MRN: [id number] Date of Birth: [birth year] Referring Provider (PT): Mayra Neer, MD   Encounter Date: 04/21/2020   PT End of Session - 04/21/20 1414    Visit Number 11    Date for PT Re-Evaluation 06/21/20    Authorization Type Medicare/Medicaid    PT Start Time 1233    PT Stop Time 1317    PT Time Calculation (min) 44 min    Activity Tolerance Patient tolerated treatment well    Behavior During Therapy [facility] for tasks assessed/performed           Past Medical History:  Diagnosis Date  . Anemia   . Anxiety   . Arthritis    osteoarthritis  . ASCUS (atypical squamous cells of undetermined significance) on Pap smear 05/06/05   NEG HIGH RISK HPV--C&B BIOPSY BENIGN 12/2005  . Asthma   . Bipolar 2 disorder ([place])   . Cancer ([place])    skin cancer - basal cell  . Colon polyps   . Complication of anesthesia    anxious afterwards, will get headaches   . Constipation   . Depression   . Fibromyalgia 10/2013  . GERD (gastroesophageal reflux disease)   . 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 Heart murmur    never had any problems  . Hemorrhoids   . High cholesterol   . 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   cytology negative  . IBS (irritable bowel syndrome)   . Insomnia   . Lymphocytic colitis   . Lymphoma ([place])   . 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 Migraines   . Osteoarthritis   . Osteopenia   . Peripheral neuropathy   . PTSD (post-traumatic stress disorder)     Past Surgical History:  Procedure Laterality Date  . BONE MARROW BIOPSY Left 12/18/2013   Plasma cell dyscrasia 8% population of plasma cells  . BREAST BIOPSY Right    benign stereo  . CESAREAN SECTION  [phone]   . CHOLECYSTECTOMY N/A 10/16/2012   Procedure: LAPAROSCOPIC CHOLECYSTECTOMY;  Surgeon: Joyice Faster. Cornett, MD;  Location: [facility];  Service: General;  Laterality: N/A;  . COLONOSCOPY     numerous times  . DILATION AND CURETTAGE OF UTERUS    . ESOPHAGOGASTRODUODENOSCOPY    . [place]   x3  . IUD REMOVAL  02/2015   Mirena  . LAPAROSCOPIC LYSIS OF ADHESIONS N/A 10/16/2012   Procedure: LAPAROSCOPIC LYSIS OF ADHESIONS;  Surgeon: Joyice Faster. Cornett, MD;  Location: [facility];  Service: General;  Laterality: N/A;  . LAPAROSCOPY N/A 10/16/2012   Procedure: LAPAROSCOPY DIAGNOSTIC;  Surgeon: Joyice Faster. Cornett, MD;  Location: [facility];  Service: General;  Laterality: N/A;  . PELVIC LAPAROSCOPY    . RADIOLOGY WITH ANESTHESIA N/A 12/16/2015   Procedure: MRI OF BRAIN WITH AND WITHOUT CONTRAST;  Surgeon: Medication Radiologist, MD;  Location: [facility];  Service: Radiology;  Laterality: N/A;  . SHOULDER SURGERY  2007/2008  . SPINE SURGERY  2010   cervical    There were no vitals filed for this visit.   Subjective Assessment - 04/21/20 1234    Subjective I tripped on a couch at my daughter's house this weekend and I fell.  I'm sore  from the fall.    Pertinent History goes by "Danielle Bruce" ; OA bilateral feet; fibromyalgia; off cycle of steriod medicine currenlty    Currently in Pain? Yes    Pain Score 5    Bil hips 4/10   Pain Location Arm    Pain Descriptors / Indicators Aching    Pain Type Chronic pain    Pain Onset More than a month ago    Aggravating Factors  walking and moving    Pain Relieving Factors rest, stretching                             [facility] Adult PT Treatment/Exercise - 04/21/20 0001      Wrist Exercises   Other wrist exercises sidelying: open book      Manual Therapy   Myofascial Release Lt wrist extensors, proximal Lt arm, bil thoracic and neck with gentle PA mobs T3-5      Kinesiotix   Facilitate Muscle  Lt wrist extensors, and biceps- both I strips.   50% stretch shorter strip over lateral epicondyle and across trigger point in Lt biceps                  PT Education - 04/21/20 1245    Education Details Access Code: [email or web address]    Person(s) Educated Patient    Methods Explanation;Demonstration;Handout    Comprehension Verbalized understanding;Returned demonstration            PT Short Term Goals - 03/29/20 1030      PT SHORT TERM GOAL #1   Title be independent in initial HEP    Status Achieved             PT Long Term Goals - 04/14/20 1240      PT LONG TERM GOAL #1   Title be independent in advanced HEP    Time 12    Period Weeks    Status On-going      PT LONG TERM GOAL #2   Title Pt will be able to walk at least 1 mile without increased pain so she can return to birding    Baseline still wearing shoes but is walking on hardwoods    Time 12    Period Weeks    Status On-going      PT LONG TERM GOAL #3   Title report 75% less neck/UE pain throughout the day with normal functional activities    Baseline reports no issues with daily headaches anymore    Status On-going      PT LONG TERM GOAL #4   Title Pt will be able to hold her grandchildren for at least 1 hour throughout the day in order to help her daughter with child care    Status On-going      PT LONG TERM GOAL #5   Title Pt will demonstrate at least 4+/5 wrist and hip strength for improved stability for walking and lifting    Status On-going                 Plan - 04/21/20 1413    Clinical Impression Statement Pt had a fall last week and is experiencing widespread pain today especially in Lt arm, upper back and bil hips.  Session focused on manual to address these areas and application of kinesiotape.  Pt is doing well with her wrist exercises.   PT added thoracic mobility exercises with open book (performed in clinic)  and verbal review of cat/cow and child's pose stretch.  PT educated pt regarding the neuroscience of pain and directed her  to try to Curable app for more information and pain strategies.   Pt has been caring for her twin grandchildren and this has been challenging for her. Progress is slow due to complexity of medical history.  Pt will continue to benefit from skilled PT to address pain associated with polymyalgia.    Rehab Potential Good    PT Frequency 1x / week    PT Duration 12 weeks    PT Treatment/Interventions ADLs/Self Care Home Management;Cryotherapy;Electrical Stimulation;Iontophoresis 73m/ml Dexamethasone;Moist Heat;Ultrasound;Neuromuscular re-education;Therapeutic exercise;Therapeutic activities;Patient/family education;Manual techniques;Dry needling;Passive range of motion;Taping    PT Next Visit Plan review thoracic mobility exercises, address areas of pain with manual therapy as needed, answer any questions regarding neuroscience of pain    PT Home Exercise Plan Access Code: [email or web address]    Consulted and Agree with Plan of Care Patient           Patient will benefit from skilled therapeutic intervention in order to improve the following deficits and impairments:  Abnormal gait,Increased fascial restricitons,Difficulty walking,Decreased range of motion,Pain,Decreased strength,Postural dysfunction  Visit Diagnosis: Polymyalgia rheumatica (HCC)  Muscle weakness (generalized)  Cramp and spasm  Abnormal posture     Problem List Patient Active Problem List   Diagnosis Date Noted  . GAD (generalized anxiety disorder) 12/26/2017  . OCD (obsessive compulsive disorder) 12/26/2017  . PTSD (post-traumatic stress disorder) 12/26/2017  . DDD (degenerative disc disease), cervical 07/14/2016  . Primary osteoarthritis of both feet 07/14/2016  . Primary osteoarthritis of both hands 07/14/2016  . Other fatigue 07/14/2016  . History of IBS 07/14/2016  . Osteopenia of multiple sites 07/14/2016  . Fibromyalgia 01/13/2016  . MGUS (monoclonal gammopathy of unknown significance) 12/23/2013  . Chronic  cholecystitis without calculus 10/18/2012  . Abdominal pain, unspecified site 10/18/2012  . Nausea alone 10/18/2012  . Unspecified constipation 10/18/2012  . Depression 10/18/2012  . Anxiety   . ASCUS (atypical squamous cells of undetermined significance) on Pap smear   . IUD   . Hemorrhoids 11/29/2010  . Abdominal pain, left upper quadrant 11/29/2010    KSigurd Sos PT 04/21/20 2:16 PM  [facility] [address] [place] [address]Phone: [phone]  Fax:  [phone] Name: Danielle BILLINGERMRN: [id number] of Birth: [birth year]

## 2020-04-21 NOTE — Patient Instructions (Signed)
Access Code: [email or web address] URL: [email or web address] Date: 04/21/2020 Prepared by: Claiborne Billings  Exercises   Sidelying Open Book Thoracic Lumbar Rotation and Extension - 1 x daily - 7 x weekly - 3 sets - 10 reps Cat-Camel to Child's Pose - 2 x daily - 7 x weekly - 10 reps - 1 sets - 5 hold Child's Pose Stretch - 1 x daily - 7 x weekly - 3 reps - 1 sets - 20 hold Child's Pose with Sidebending - 2 x daily - 7 x weekly - 10 reps - 20 hold

## 2020-04-21 NOTE — Progress Notes (Signed)
Danielle Bruce [id number] [birth year] 60 y.o.   Subjective:   Patient ID:  Danielle Bruce is a 60 y.o. (DOB October 30, 1960) female.  Chief Complaint:  Chief Complaint  Patient presents with  . Major depressive disorder, recurrent episode, moderate ([place])  . Anxiety  . Depression    Med changes    HPI Danielle Bruce presents for follow-up of multiple dxes.  visit in April and taken off lamotrigine DT possible skin reaction.  Had appt with [facility] dermatology and they assume that skin reaction with itching was related to lamotrigine. 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 Per oncology most likely drug is lamotrigine. And she weaned off of the medication to determine if the cutaneous lesions are related. Lesions seem less without lamotrigine.  At this point skin reaction is presumed related to lamotrigine.  No other disease sx. She is well aware that she has been on lamotrigine for many years with good results for depression control. There is a risk of relapse as she comes off of the medication and she is worried about that. Smart phone helps her cognitive  And other productivity.    seen October 2020.  Restarted low dosage ADD bc concerns over STM, concentration and productivity.  Ritalin 5 mg twice daily.  Also suggested she retry N-acetylcysteine for mild cognitive complaints.  Last seen April 07, 2019 the following was noted: Mostly good.  Anxiety is mostly better, but depression is a little worse seasonally.  Not able to go help her daughter.   Low energy but does have motivation.  Ritalin helps energy and focus but doesn't take it regularly bc irregular sleep cycle.   Ativan only when having medical issues.    Therapy working on trauma issues is very hard.  Struggling with the fact she doesn't have memory of large parts of her past.   No manic sx off mood  stabilizers.  Covid isolation reduce stressors overall and not prominently depressed.   No meds were changed.  June 25, 2019 appointment the following is noted: F real worried about Covid despite vaccination.   Started fluvoxamine 25 on May 14, 2019 from PCP mainly for inflammation but possibly thinking she might be depressed. Seen benefit for energy and sleep pattern and more appropriate feelings and less numb.  Now realizes she had been depressed for awhile but just numb with depression.  Anniversaries of deaths of friends including last BF Danielle Bruce a little over a year ago.   Thinks of him daily. His 58 yo D died of unknown cause recently.  Stress D moved to beach. Wonders about increasing the fluvoxamine  Danielle Bruce living in [place] and hasn't seen him in a year. Danielle Bruce on disability for depression. Patient reports more depression.  Plan: Realized lately she's depressed and started Luvox again for it and for the anti-inflammatory potential for her health.  Has seen some benefit.   Prior benefit at 25 mg daily.  Optiion increase but slightly bc med sensitive. AGREE increase fluvoxamine to 37.5 mg daily.  08/06/2019 appointment with the following noted: Increased fluvoxamine to 37.5 mg and it helped the depression. Could see a big difference and had been more depressed than she even realized. Outlook better and sleep pattern normalized.    More energy and motivation.   But also started having prolonged HA.  HA lasted a couple of weeks.   Assumed HA was DT  increased fluvoxamine so reduced to 25 mg on 07/28/19.  07/29/19 Episode of confusion occurred also.  Went to doctor while confused.  They couldn't determine cause.    Resolved in 1 day. FM flare up since fall/winter. To beach with daughter 3 mos minimum to give her shots for IVF treatment. Patient reports panic recently and recent difficulty with anxiety.    Occ flashbacks.   Patient denies difficulty with sleep initiation or maintenance. Denies  appetite disturbance.  Patient reports that energy and motivation have been good.  Patient denies any difficulty with concentration.  Patient denies any suicidal ideation. Plan: AGREE increase fluvoxamine to 37.5 mg daily once HA is controlled for several weekcs.  01/01/20 appt with the following noted: Increased fluvoxamine for awhile but got more HA and reduced it. Anxiety is better in general now.  But is interested in increasing it DT coming winter. When anxiety is not good it catches her off guard. Likely had Covid in August but early test was negative.  Got prednisone but sick for 5 weeks.  Lost taste and smell still going on.  D also had Covid.  Felt really sick and terrified with bad mental health at the time.  Was traumatic for her.  Had been vaccinated.   Had panic attack at the ER and took a long time to get the Ativan.  D moving back to area and pt will have to help with childcare so hopes to more aggressively treat her health problems. 2 gkids and 2 more coming. Danielle Bruce married without kids yet. Plan: started Luvox again for it and for the anti-inflammatory potential for her health.  Has seen some benefit.   Prior benefit at 25 mg daily.  Optiion increase but slightly bc med sensitive. AGREE increase fluvoxamine to 37.5 mg daily once HA is controlled for several weeks.  03/03/2020 appointment with the following noted: On Day 10 of Covid.  2nd bout. A lot easier this time. Had gotten Luvox up to 50 mg daily and tolerated.  Then doubled it to 100 mg daily. Wonders about the proper dose for maintenance.   No taste and smell, exhausted, congestion and mild cough.  Energy is better than it was in the beginning.  Made her depressed and anxious again and that part is better also.  Last time had to go to ER with Covid.  Only ER visit in 2-3 years.  Less migraine than in the past. Overall was doing better with last couple months.  Trying to do anti-inflammatory diet.  Vegetarian, no dairy or  gluten.  Natural sugars.   Still some anxiety. D moved back here and pregnant with 2nd set of twins. Current twins are 58 yo.   Will restart trauma therapy and it's helped over the last couple of years.  More freedom from things.      Less daily dread with anxiety but still some panic which can be triggered.  Less OCD with fluvoxamine unless triggered.   Taking Ritalin 10 mg each AM and tolerating and benefiting. occ NM with few dreams overall. Plan: started Luvox again for it and for the anti-inflammatory potential for her health.  Has seen some benefit.   Prior benefit at 25 mg daily.  Med sensitive. AGREE increase fluvoxamine to  50  mg daily. Up to 100 mg daily if desired.   04/22/19 appt with following noted: Mostly good and randomly psycho.  Really busy with Danielle Bruce [redacted] weeks pregnant with twins and twins 58 yo at  home.  She's helping and her H is away. Mo older and slower.  Pt overworking.  When with Danielle Bruce kids interfere with sleep.  Doesn't eat as well with Danielle Bruce affects her FM and energy.   Recently exhausted and needed rest but couldn't go home DT D had to go to hospital.  Her H critical at times and she lost it emotionally over this. Almost suicidal reaction with weird intrusive thoughts in response to the criticism.   Got a break and it helped.   Rheum adjusted Lyrica to BID. Had accident fall with one of babies and was bleeding.  She became hysterical.   Upset now that she couldn't control her emotions at the time.  Couldn't get herself under control to deal with the crisis.    Overall doing ok but doesn't handle stressors well and lose it.    Past psych meds: Lithium, carbamazepine, oxcarbazepine, topiramate, valproic acid, lamotrigine, gabapentin  Zyprexa, Seroquel, Latuda headaches, Geodon,  Risperidone, Stelazine,  Rexulti,  Wellbutrin with side effects,  sertraline irritability, duloxetine, fluvoxamine 100 with Covid, fluoxetine, Stopped desipramine DT skin issues which  now resolved.  It helped GI px.  buspirone side effects,   N-acetylcysteine, pramipexole,  methylphenidate,  Adderall,   Ambien with amnesia, trazodone, Ativan  Review of Systems:  Review of Systems  Constitutional: Positive for fatigue.  Cardiovascular: Negative for chest pain.  Gastrointestinal: Positive for abdominal pain. Negative for abdominal distention and vomiting.  Musculoskeletal: Positive for myalgias.  Neurological: Positive for dizziness and headaches. Negative for tremors and weakness.  Psychiatric/Behavioral: Positive for dysphoric mood. Negative for agitation, behavioral problems, confusion, hallucinations, self-injury and suicidal ideas. The patient is nervous/anxious.   Less HA with new meds.  Medications: I have reviewed the patient's current medications.  Current Outpatient Medications  Medication Sig Dispense Refill  . albuterol (ACCUNEB) 0.63 MG/3ML nebulizer solution Take 1 ampule by nebulization every 6 (six) hours as needed for wheezing.    Marland Kitchen albuterol (PROVENTIL HFA;VENTOLIN HFA) 108 (90 Base) MCG/ACT inhaler Inhale 2 puffs into the lungs every 6 (six) hours as needed for wheezing or shortness of breath.    . Ascorbic Acid (VITAMIN C) 100 MG tablet Take 100 mg by mouth daily.    Marland Kitchen azelastine (ASTELIN) 0.1 % nasal spray Place into both nostrils 2 (two) times daily. Use in each nostril as directed    . BEPREVE 1.5 % SOLN Place 1 drop into both eyes daily.     . Coenzyme Q10 (CO Q 10 PO) Take by mouth daily.    . cyclobenzaprine (FLEXERIL) 10 MG tablet Take 10 mg by mouth at bedtime as needed.    . diphenhydrAMINE (BENADRYL) 25 mg capsule Take 50 mg by mouth every 6 (six) hours as needed (HEADACHES).    Marland Kitchen EPINEPHrine 0.3 mg/0.3 mL IJ SOAJ injection as needed.    Eduard Roux 70 MG/ML SOAJ Inject into the skin every 28 (twenty-eight) days.    . fluorouracil (EFUDEX) 5 % cream as needed.    . fluticasone (FLONASE) 50 MCG/ACT nasal spray Place 2 sprays into both  nostrils daily. 16 g 0  . fluvoxaMINE (LUVOX) 25 MG tablet Take 2 tablets (50 mg total) by mouth 2 (two) times daily. (Patient taking differently: Take 75 mg by mouth daily.) 180 tablet 0  . L-methylfolate Calcium 15 MG TABS TAKE 1 TABLET BY MOUTH DAILY 30 tablet 11  . levocetirizine (XYZAL) 5 MG tablet Take 5 mg by mouth every evening.  5  .  LORazepam (ATIVAN) 1 MG tablet Take 1 tablet (1 mg total) by mouth every 8 (eight) hours as needed for anxiety. (takes 2 mg when having a medical procedure.) 30 tablet 1  . LORazepam (ATIVAN) 2 MG/ML concentrated solution Take 0.5 mLs (1 mg total) by mouth every 8 (eight) hours as needed for anxiety (severe panic). 30 mL 1  . methocarbamol (ROBAXIN) 500 MG tablet Take 1 tablet (500 mg total) by mouth daily as needed for muscle spasms. 30 tablet 1  . methylphenidate (RITALIN) 10 MG tablet Take 1 tablet (10 mg total) by mouth daily. 30 tablet 0  . montelukast (SINGULAIR) 10 MG tablet Take 10 mg by mouth at bedtime.    Marland Kitchen oxyCODONE-acetaminophen (PERCOCET/ROXICET) 5-325 MG per tablet Take 2 tablets by mouth daily as needed for moderate pain or severe pain (mighraine.).     Marland Kitchen polyethylene glycol (MIRALAX / GLYCOLAX) packet Take 17 g by mouth daily as needed for mild constipation.    Marland Kitchen PRAVASTATIN SODIUM PO Take 40 mg by mouth.     . pregabalin (LYRICA) 50 MG capsule Take 100 mg by mouth at bedtime. 1 tab in am and 1 tab HS.    Marland Kitchen PROLIA 60 MG/ML SOSY injection BRING TO THE OFFICE FOR INJECTION ON 02/15/2018 AS DIRECTED ONCE EVERY 6 MONTHS    . promethazine (PHENERGAN) 25 MG tablet Take 25 mg by mouth every 6 (six) hours as needed for nausea.    . Rimegepant Sulfate (NURTEC PO) Take by mouth as needed.    . traZODone (DESYREL) 50 MG tablet TAKE 1 TABLET(50 MG) BY MOUTH AT BEDTIME 90 tablet 0  . TURMERIC PO Take by mouth daily.    Marland Kitchen Ubrogepant 50 MG TABS Take 50-100 mg by mouth daily as needed.    Marland Kitchen VASCEPA 1 g CAPS TK 2 CS PO BID WC  3  . VITAMIN D PO Take by  mouth. Take 5022m daily    . VOLTAREN 1 % GEL Apply 2 g topically 4 (four) times daily as needed (JOINT PAIN).   3  . Azelastine HCl 0.15 % SOLN Place 2 sprays into both nostrils daily. (Patient not taking: No sig reported)    . BREO ELLIPTA 200-25 MCG/INH AEPB 1 puff daily. (Patient not taking: No sig reported)    . Butalbital-APAP-Caffeine 50-300-40 MG CAPS butalbital-acetaminophen-caffeine 50 mg-300 mg-40 mg capsule  TAKE 1 CAPSULE BY MOUTH EVERY 4 HOURS AS NEEDED FOR MIGRAINE (Patient not taking: Reported on 04/21/2020)    . dexlansoprazole (DEXILANT) 60 MG capsule Dexilant 60 mg capsule, delayed release  TAKE 1 CAPSULE BY MOUTH EVERY DAY (Patient not taking: No sig reported)    . hydrOXYzine (ATARAX/VISTARIL) 25 MG tablet TK 1 TO 2 TS PO TID PRF MIGRAINE (Patient not taking: Reported on 04/21/2020)  4  . MELATONIN PO Take 1.5 mg by mouth at bedtime as needed. (Patient not taking: Reported on 04/21/2020)     No current facility-administered medications for this visit.    Medication Side Effects: None  Allergies:  Allergies  Allergen Reactions  . Sulfa Antibiotics Nausea And Vomiting    Causes pt to vomit blood  . Gluten Meal Other (See Comments)    Sensitivity.   . Lactose Intolerance (Gi) Nausea And Vomiting  . Flagyl [Metronidazole Hcl] Nausea And Vomiting  . Morphine And Related Other (See Comments)    Reaction: Depression, emotional    Past Medical History:  Diagnosis Date  . Anemia   . Anxiety   .  Arthritis    osteoarthritis  . ASCUS (atypical squamous cells of undetermined significance) on Pap smear 05/06/05   NEG HIGH RISK HPV--C&B BIOPSY BENIGN 12/2005  . Asthma   . Bipolar 2 disorder ([place])   . Cancer ([place])    skin cancer - basal cell  . Colon polyps   . Complication of anesthesia    anxious afterwards, will get headaches   . Constipation   . Depression   . Fibromyalgia 10/2013  . GERD (gastroesophageal reflux disease)   . 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 Heart  murmur    never had any problems  . Hemorrhoids   . High cholesterol   . 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   cytology negative  . IBS (irritable bowel syndrome)   . Insomnia   . Lymphocytic colitis   . Lymphoma ([place])   . 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 Migraines   . Osteoarthritis   . Osteopenia   . Peripheral neuropathy   . PTSD (post-traumatic stress disorder)     Family History  Problem Relation Age of Onset  . Diabetes Father   . Hypertension Father   . Hyperlipidemia Father   . Heart disease Maternal Grandfather   . Heart disease Paternal Grandmother   . Heart disease Paternal Grandfather   . Depression Son   . Depression Son   . Anxiety disorder Son   . Depression Daughter   . Anxiety disorder Daughter   . Asthma Daughter     Social History   Socioeconomic History  . Marital status: Divorced    Spouse name: Not on file  . Number of children: 3  . Years of education: Not on file  . Highest education level: Not on file  Occupational History  . Not on file  Tobacco Use  . Smoking status: Former Smoker    Types: Cigarettes    Quit date: 05/13/2014    Years since quitting: 5.9  . Smokeless tobacco: Never Used  . Tobacco comment: social   Vaping Use  . Vaping Use: Never used  Substance and Sexual Activity  . Alcohol use: Yes    Alcohol/week: 0.0 standard drinks    Comment: rare  . Drug use: Never  . Sexual activity: Not Currently    Comment: -1st intercourse 60 yo-More than 5 partners  Other Topics Concern  . Not on file  Social History Narrative  . Not on file   Social Determinants of Health   Financial Resource Strain: Not on file  Food Insecurity: Not on file  Transportation Needs: Not on file  Physical Activity: Not on file  Stress: Not on file  Social Connections: Not on file  Intimate Partner Violence: Not on file    Past Medical History, Surgical  history, Social history, and Family history were reviewed and updated as appropriate.   Please see review of systems for further details on the patient's review from today.   Objective:   Physical Exam:  BP 139/88   Pulse 74   LMP 12/28/2010   Physical Exam Constitutional:      General: She is not in acute distress. Musculoskeletal:        General: No deformity.  Neurological:     Mental Status: She is alert and oriented to person, place, and time.     Cranial Nerves: No dysarthria.     Coordination: Coordination normal.  Psychiatric:        Attention and Perception: Attention and perception normal. She does not perceive auditory or visual hallucinations.        Mood and Affect: Mood is anxious. Mood is not depressed. Affect is not labile, blunt, angry or inappropriate.        Speech: Speech normal.        Behavior: Behavior normal. Behavior is cooperative.        Thought Content: Thought content normal. Thought content is not paranoid or delusional. Thought content does not include homicidal or suicidal ideation. Thought content does not include homicidal or suicidal plan.        Cognition and Memory: Cognition and memory normal.        Judgment: Judgment normal.     Comments: Insight intact Depression resolved with fluvoxamine 50 routinely. Mood worsen when health problems worsen.   Recent trauma led to guilt feelings.     Lab Review:     Component Value Date/Time   NA 142 08/01/2016 1713   NA 144 07/12/2015 1521   K 4.7 08/01/2016 1713   K 3.8 07/12/2015 1521   CL 107 08/01/2016 1713   CO2 23 08/01/2016 1713   CO2 27 07/12/2015 1521   GLUCOSE 95 08/01/2016 1713   GLUCOSE 82 07/12/2015 1521   BUN 14 08/01/2016 1713   BUN 10.3 07/12/2015 1521   CREATININE 0.87 08/01/2016 1713   CREATININE 0.8 07/12/2015 1521   CALCIUM 10.0 08/01/2016 1713   CALCIUM 9.9 07/12/2015 1521   PROT 7.4 08/01/2016 1713   PROT 7.4 07/12/2015 1521   ALBUMIN 4.8 08/01/2016 1713   ALBUMIN  4.4 07/12/2015 1521   AST 23 08/01/2016 1713   AST 22 07/12/2015 1521   ALT 26 08/01/2016 1713   ALT 31 07/12/2015 1521   ALKPHOS 55 08/01/2016 1713   ALKPHOS 54 07/12/2015 1521   BILITOT 0.7 08/01/2016 1713   BILITOT 0.40 07/12/2015 1521   GFRNONAA 76 08/01/2016 1713   GFRAA 88 08/01/2016 1713       Component Value Date/Time   WBC 5.9 08/01/2016 1713   RBC 4.71 08/01/2016 1713   HGB 14.6 08/01/2016 1713   HGB 14.1 07/12/2015 1521   HCT 43.1 08/01/2016 1713   HCT 41.5 07/12/2015 1521   PLT 254 08/01/2016 1713   PLT 248 07/12/2015 1521   MCV 91.5 08/01/2016 1713   MCV 92.2 07/12/2015 1521   MCH 31.0 08/01/2016 1713   MCHC 33.9 08/01/2016 1713   RDW 13.2 08/01/2016 1713   RDW 13.3 07/12/2015 1521   LYMPHSABS 2,006 08/01/2016 1713   LYMPHSABS 1.8 07/12/2015 1521   MONOABS 354 08/01/2016 1713   MONOABS 0.7 07/12/2015 1521   EOSABS 118 08/01/2016 1713   EOSABS 0.1 07/12/2015 1521   BASOSABS 0 08/01/2016 1713   BASOSABS 0.0 07/12/2015 1521    No results found for: POCLITH, LITHIUM   No results found for: PHENYTOIN, PHENOBARB, VALPROATE, CBMZ   .res Assessment: Plan:    Naliyah was seen today for major depressive disorder, recurrent episode, moderate (hcc), anxiety and depression.  Diagnoses and all orders for this visit:  Major depressive disorder, recurrent episode, moderate (HCC)  Attention deficit hyperactivity disorder (ADHD), combined type  PTSD (post-traumatic stress disorder)  Generalized anxiety disorder  Panic disorder with agoraphobia  Last year more obvious PTSD obvious has called into doubt the bipolar dix bc she's remained mood stable off the lamotrigine.  Need more time to reevaluate this and disc risk  mania and mood cycling.  Discussed continued risk of hypomanic symptoms.  She has had symptoms consistent with hypomania in the past but there is now some question as to whether they were PTSD related rather than truly bipolar 2.  Her son has had  treatment resistant depression with possible bipolar disorder and her daughter has been diagnosed with bipolar disorder but also a history of borderline personality disorder.  So there is a family history of the spectrum.  started Luvox again for it and for the anti-inflammatory potential for her health.  Has seen some benefit.   Prior benefit at 25 mg daily.  Med sensitive. AGREE with increase fluvoxamine to  50  mg daily. Up to 100 mg daily if desired. Clear benefit from fluvoxamine for mood quite dramatically. No manic sx. Disc anti-inflammatory effects of fluvoxamine at doses of 100 mg BID for Covid.  2 studies proving benefit so far. HA are better.   Stress management for mood sx. Health problems worsen mood episodically.   Processed trauma of accident with 60yo [place].  She wants to try further trauma work with new therapist in hopes of being less triggered.  Overall doing well but when triggered doesn't handle it well.  We discussed the short-term risks associated with benzodiazepines including sedation and increased fall risk among others.  Discussed long-term side effect risk including dependence, potential withdrawal symptoms, and the potential eventual dose-related risk of dementia.  Keep this at a minimum given cognitive concerns.  She only uses it when triggered with anxiety usually medical.  She is not using it regularly.  Discussed potential benefits, risks, and side effects of stimulants with patient to include increased heart rate, palpitations, insomnia, increased anxiety, increased irritability, or decreased appetite.  Instructed patient to contact office if experiencing any significant tolerability issues. Benefit so continue Ritalin 5 BID or 10 mg AM for cognitive and ADD reasons and disc risk of palpitations.   Continue therapy.  She is working well on things. Plans more therapy soon.  Greater than 50% of 30 mins of webex face to face time with patient was spent on counseling and  coordination of care.   FU 3 mos  Lynder Parents, MD, DFAPA    Please see After Visit Summary for patient specific instructions.  Future Appointments  Date Time Provider [place]  04/21/2020 12:30 PM Danie Binder, PT [facility]  04/28/2020 12:30 PM Desenglau, Tommy Rainwater, PT [facility]  05/05/2020 12:30 PM Desenglau, Tommy Rainwater, PT [facility]  06/29/2020  1:15 PM Bo Merino, MD [facility] None    No orders of the defined types were placed in this encounter.     -------------------------------

## 2020-04-28 ENCOUNTER — Other Ambulatory Visit: Payer: Self-pay

## 2020-04-28 ENCOUNTER — Ambulatory Visit: Payer: Medicare Other | Attending: Family Medicine | Admitting: Physical Therapy

## 2020-04-28 ENCOUNTER — Encounter: Payer: Self-pay | Admitting: Physical Therapy

## 2020-04-28 DIAGNOSIS — M353 Polymyalgia rheumatica: Secondary | ICD-10-CM

## 2020-04-28 DIAGNOSIS — M79671 Pain in right foot: Secondary | ICD-10-CM

## 2020-04-28 DIAGNOSIS — R293 Abnormal posture: Secondary | ICD-10-CM

## 2020-04-28 DIAGNOSIS — M79672 Pain in left foot: Secondary | ICD-10-CM | POA: Diagnosis not present

## 2020-04-28 DIAGNOSIS — M6281 Muscle weakness (generalized): Secondary | ICD-10-CM

## 2020-04-28 DIAGNOSIS — R252 Cramp and spasm: Secondary | ICD-10-CM | POA: Diagnosis not present

## 2020-04-28 NOTE — Therapy (Signed)
[facility] [address], [place], [place], [address] Phone: [phone]   Fax:  [phone]  Physical Therapy Treatment  Patient Details  Name: Danielle Bruce MRN: [id number] Date of Birth: 1960-09-23 Referring Provider (PT): Mayra Neer, MD   Encounter Date: 04/28/2020   PT End of Session - 04/28/20 1237    Visit Number 12    Date for PT Re-Evaluation 06/21/20    Authorization Type Medicare/Medicaid    PT Start Time 1235    PT Stop Time 1315    PT Time Calculation (min) 40 min    Activity Tolerance Patient tolerated treatment well    Behavior During Therapy [facility] for tasks assessed/performed           Past Medical History:  Diagnosis Date  . Anemia   . Anxiety   . Arthritis    osteoarthritis  . ASCUS (atypical squamous cells of undetermined significance) on Pap smear 05/06/05   NEG HIGH RISK HPV--C&B BIOPSY BENIGN 12/2005  . Asthma   . Bipolar 2 disorder ([place])   . Cancer ([place])    skin cancer - basal cell  . Colon polyps   . Complication of anesthesia    anxious afterwards, will get headaches   . Constipation   . Depression   . Fibromyalgia 10/2013  . GERD (gastroesophageal reflux disease)   . 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 Heart murmur    never had any problems  . Hemorrhoids   . High cholesterol   . 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   cytology negative  . IBS (irritable bowel syndrome)   . Insomnia   . Lymphocytic colitis   . Lymphoma ([place])   . 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 Migraines   . Osteoarthritis   . Osteopenia   . Peripheral neuropathy   . PTSD (post-traumatic stress disorder)     Past Surgical History:  Procedure Laterality Date  . BONE MARROW BIOPSY Left 12/18/2013   Plasma cell dyscrasia 8% population of plasma cells  . BREAST BIOPSY Right    benign stereo  . CESAREAN SECTION  [phone]   . CHOLECYSTECTOMY N/A 10/16/2012   Procedure: LAPAROSCOPIC CHOLECYSTECTOMY;  Surgeon: Joyice Faster. Cornett, MD;  Location: [facility];  Service: General;  Laterality: N/A;  . COLONOSCOPY     numerous times  . DILATION AND CURETTAGE OF UTERUS    . ESOPHAGOGASTRODUODENOSCOPY    . Maribel   x3  . IUD REMOVAL  02/2015   Mirena  . LAPAROSCOPIC LYSIS OF ADHESIONS N/A 10/16/2012   Procedure: LAPAROSCOPIC LYSIS OF ADHESIONS;  Surgeon: Joyice Faster. Cornett, MD;  Location: [facility];  Service: General;  Laterality: N/A;  . LAPAROSCOPY N/A 10/16/2012   Procedure: LAPAROSCOPY DIAGNOSTIC;  Surgeon: Joyice Faster. Cornett, MD;  Location: [facility];  Service: General;  Laterality: N/A;  . PELVIC LAPAROSCOPY    . RADIOLOGY WITH ANESTHESIA N/A 12/16/2015   Procedure: MRI OF BRAIN WITH AND WITHOUT CONTRAST;  Surgeon: Medication Radiologist, MD;  Location: [facility];  Service: Radiology;  Laterality: N/A;  . SHOULDER SURGERY  2007/2008  . SPINE SURGERY  2010   cervical    There were no vitals filed for this visit.   Subjective Assessment - 04/28/20 1238    Subjective My left thigh and hip have been hurting since I fell.  Ptstates the Lt calf hurts  when walking    Pertinent History goes by "Danielle Bruce" ; OA bilateral feet; fibromyalgia; off cycle of steriod medicine currenlty    Limitations Standing;Walking;House hold activities    Currently in Pain? Yes    Pain Score 4     Pain Location Hip    Pain Orientation Right;Left   mostly left   Pain Descriptors / Indicators Aching    Pain Radiating Towards buttck and thigh radiating    Pain Onset More than a month ago    Pain Frequency Intermittent    Multiple Pain Sites No                             [facility] Adult PT Treatment/Exercise - 04/28/20 0001      Lumbar Exercises: Standing   Other Standing Lumbar Exercises SLS with glute activation - attempt without UE support only can do for 2 seconds      Lumbar Exercises: Sidelying   Clam 10 reps     Clam Limitations Lt side and needed support to keep from rolling back      Manual Therapy   Soft tissue mobilization fascial release to VLateralis, IT band, gluteals Lt side only - addaday used as well                    PT Short Term Goals - 03/29/20 1030      PT SHORT TERM GOAL #1   Title be independent in initial HEP    Status Achieved             PT Long Term Goals - 04/28/20 1324      PT LONG TERM GOAL #1   Title be independent in advanced HEP    Status On-going      PT LONG TERM GOAL #2   Title Pt will be able to walk at least 1 mile without increased pain so she can return to birding    Baseline increased pain in calf when walking (left side)    Status On-going                 Plan - 04/28/20 1320    Clinical Impression Statement Pt is doing much better than last week as far as pain but stil struggling with setback from fall.  pt was able to do some SLS with one UE support.  pt has tensionin vastus lateralis, IT band, glutes on left side and got good release with myofascial release and addaday use to lengthen soft tissue.  Increased plaiability was palpated.  pt was given clam shell exercise to do at home.  Pt is unable to do sinlce leg stand witout one UE support due to blance and weakness in LE .  She will bneefit from skilled PT to manage pain with polymyalgia while owrking on strength and balance.    PT Treatment/Interventions ADLs/Self Care Home Management;Cryotherapy;Electrical Stimulation;Iontophoresis 42m/ml Dexamethasone;Moist Heat;Ultrasound;Neuromuscular re-education;Therapeutic exercise;Therapeutic activities;Patient/family education;Manual techniques;Dry needling;Passive range of motion;Taping    PT Next Visit Plan review thoracic mobility exercises, address areas of pain with manual therapy as needed, answer any questions regarding neuroscience of pain    PT Home Exercise Plan Access Code: [email or web address]    Consulted and Agree with Plan of Care  Patient           Patient will benefit from skilled therapeutic intervention in order to improve the following deficits and impairments:  Abnormal gait,Increased fascial restricitons,Difficulty walking,Decreased range  of motion,Pain,Decreased strength,Postural dysfunction  Visit Diagnosis: Polymyalgia rheumatica ([facility])  Muscle weakness (generalized)  Cramp and spasm  Abnormal posture  Pain in right foot  Pain in left foot     Problem List Patient Active Problem List   Diagnosis Date Noted  . GAD (generalized anxiety disorder) 12/26/2017  . OCD (obsessive compulsive disorder) 12/26/2017  . PTSD (post-traumatic stress disorder) 12/26/2017  . DDD (degenerative disc disease), cervical 07/14/2016  . Primary osteoarthritis of both feet 07/14/2016  . Primary osteoarthritis of both hands 07/14/2016  . Other fatigue 07/14/2016  . History of IBS 07/14/2016  . Osteopenia of multiple sites 07/14/2016  . Fibromyalgia 01/13/2016  . MGUS (monoclonal gammopathy of unknown significance) 12/23/2013  . Chronic cholecystitis without calculus 10/18/2012  . Abdominal pain, unspecified site 10/18/2012  . Nausea alone 10/18/2012  . Unspecified constipation 10/18/2012  . Depression 10/18/2012  . Anxiety   . ASCUS (atypical squamous cells of undetermined significance) on Pap smear   . IUD   . Hemorrhoids 11/29/2010  . Abdominal pain, left upper quadrant 11/29/2010    Jule Ser, PT 04/28/2020, 1:32 PM  [facility] [address] [place], [place], [address] Phone: [phone]   Fax:  [phone]  Name: Danielle Bruce MRN: [id number] Date of Birth: [birth year]

## 2020-04-30 DIAGNOSIS — F411 Generalized anxiety disorder: Secondary | ICD-10-CM | POA: Diagnosis not present

## 2020-04-30 DIAGNOSIS — M797 Fibromyalgia: Secondary | ICD-10-CM | POA: Diagnosis not present

## 2020-04-30 DIAGNOSIS — R350 Frequency of micturition: Secondary | ICD-10-CM | POA: Diagnosis not present

## 2020-04-30 DIAGNOSIS — M353 Polymyalgia rheumatica: Secondary | ICD-10-CM | POA: Diagnosis not present

## 2020-04-30 DIAGNOSIS — K760 Fatty (change of) liver, not elsewhere classified: Secondary | ICD-10-CM | POA: Diagnosis not present

## 2020-05-05 ENCOUNTER — Ambulatory Visit: Payer: Medicare Other | Admitting: Physical Therapy

## 2020-05-05 ENCOUNTER — Encounter: Payer: Self-pay | Admitting: Physical Therapy

## 2020-05-05 ENCOUNTER — Other Ambulatory Visit: Payer: Self-pay

## 2020-05-05 DIAGNOSIS — M353 Polymyalgia rheumatica: Secondary | ICD-10-CM | POA: Diagnosis not present

## 2020-05-05 DIAGNOSIS — M79671 Pain in right foot: Secondary | ICD-10-CM | POA: Diagnosis not present

## 2020-05-05 DIAGNOSIS — M79672 Pain in left foot: Secondary | ICD-10-CM | POA: Diagnosis not present

## 2020-05-05 DIAGNOSIS — R293 Abnormal posture: Secondary | ICD-10-CM

## 2020-05-05 DIAGNOSIS — M6281 Muscle weakness (generalized): Secondary | ICD-10-CM | POA: Diagnosis not present

## 2020-05-05 DIAGNOSIS — R252 Cramp and spasm: Secondary | ICD-10-CM | POA: Diagnosis not present

## 2020-05-05 NOTE — Therapy (Signed)
[facility] [address], [address] [place], [place], [address] Phone: [phone]   Fax:  [phone]  Physical Therapy Treatment  Patient Details  Name: Danielle Bruce MRN: [id number] Date of Birth: 12/26/1960 Referring Provider (PT): Shaw, Kimberlee, MD   Encounter Date: 05/05/2020   PT End of Session - 05/05/20 1327    Visit Number 13    Date for PT Re-Evaluation 06/21/20    Authorization Type Medicare/Medicaid    PT Start Time 1232    PT Stop Time 1312    PT Time Calculation (min) 40 min    Activity Tolerance Patient tolerated treatment well    Behavior During Therapy W[facility] for tasks assessed/performed           Past Medical History:  Diagnosis Date  . Anemia   . Anxiety   . Arthritis    osteoarthritis  . ASCUS (atypical squamous cells of undetermined significance) on Pap smear 05/06/05   NEG HIGH RISK HPV--C&B BIOPSY BENIGN 12/2005  . Asthma   . Bipolar 2 disorder ([facility])   . Cancer ([facility])    skin cancer - basal cell  . Colon polyps   . Complication of anesthesia    anxious afterwards, will get headaches   . Constipation   . Depression   . Fibromyalgia 10/2013  . GERD (gastroesophageal reflux disease)   . 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 Heart murmur    never had any problems  . Hemorrhoids   . High cholesterol   . 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   cytology negative  . IBS (irritable bowel syndrome)   . Insomnia   . Lymphocytic colitis   . Lymphoma ([facility])   . 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 Migraines   . Osteoarthritis   . Osteopenia   . Peripheral neuropathy   . PTSD (post-traumatic stress disorder)     Past Surgical History:  Procedure Laterality Date  . BONE MARROW BIOPSY Left 12/18/2013   Plasma cell dyscrasia 8% population of plasma cells  . BREAST BIOPSY Right    benign stereo  . CESAREAN SECTION  86,88,93   . CHOLECYSTECTOMY N/A 10/16/2012   Procedure: LAPAROSCOPIC CHOLECYSTECTOMY;  Surgeon: Thomas A. Cornett, MD;  [facility];  Service: General;  Laterality: N/A;  . COLONOSCOPY     numerous times  . DILATION AND CURETTAGE OF UTERUS    . ESOPHAGOGASTRODUODENOSCOPY    . HEMORRHOID SURGERY  1993   x3  . IUD REMOVAL  02/2015   Mirena  . LAPAROSCOPIC LYSIS OF ADHESIONS N/A 10/16/2012   Procedure: LAPAROSCOPIC LYSIS OF ADHESIONS;  Surgeon: Thomas A. Cornett, MD;  [facility];  Service: General;  Laterality: N/A;  . LAPAROSCOPY N/A 10/16/2012   Procedure: LAPAROSCOPY DIAGNOSTIC;  Surgeon: Thomas A. Cornett, MD;  [facility];  Service: General;  Laterality: N/A;  . PELVIC LAPAROSCOPY    . RADIOLOGY WITH ANESTHESIA N/A 12/16/2015   Procedure: MRI OF BRAIN WITH AND WITHOUT CONTRAST;  Surgeon: Medication Radiologist, MD;  Location[facility];  Service: Radiology;  Laterality: N/A;  . SHOULDER SURGERY  2007/2008  . SPINE SURGERY  2010   cervical    There were no vitals filed for this visit.   Subjective Assessment - 05/05/20 1233    Subjective I have been sore in Lt shoulder and upper traps.  I had to do some pain   relief.    Patient Stated Goals Pt wants pain management and be able to move more without taking medication    Currently in Pain? Yes    Pain Score 5    worse in the head and upper back   Pain Location Generalized    Pain Descriptors / Indicators Aching    Pain Type Acute pain    Pain Radiating Towards HA, feet and ankles, upper back into Lt shoulder    Pain Onset More than a month ago    Pain Frequency Intermittent    Pain Relieving Factors TENS    Multiple Pain Sites No                             [facility] Adult PT Treatment/Exercise - 05/05/20 0001      Shoulder Exercises: Supine   Other Supine Exercises serratus punch and shoulder rolls 10x each - educated and perfomred      Manual Therapy   Myofascial Release upper traps, sub scap Lt, cervical  paraspinals and suboccipitals                  PT Education - 05/05/20 1308    Education Details Access Code: [email or web address]    Person(s) Educated Patient    Methods Explanation;Demonstration;Verbal cues;Handout;Tactile cues    Comprehension Verbalized understanding;Returned demonstration            PT Short Term Goals - 03/29/20 1030      PT SHORT TERM GOAL #1   Title be independent in initial HEP    Status Achieved             PT Long Term Goals - 04/28/20 1324      PT LONG TERM GOAL #1   Title be independent in advanced HEP    Status On-going      PT LONG TERM GOAL #2   Title Pt will be able to walk at least 1 mile without increased pain so she can return to birding    Baseline increased pain in calf when walking (left side)    Status On-going                 Plan - 05/05/20 1323    Clinical Impression Statement Pt overall dong better but the weather is causing generalized pain.  She is still having pain from the fall and neck and shoulders sore with a HA today.  Pt did well with fascial release for pain to begin the treatment.  Pt was given serratus exercises to increased scap stability and shoulder rolls to maintain improved soft tissue mobility around the shoulder blades    PT Treatment/Interventions ADLs/Self Care Home Management;Cryotherapy;Electrical Stimulation;Iontophoresis 4mg/ml Dexamethasone;Moist Heat;Ultrasound;Neuromuscular re-education;Therapeutic exercise;Therapeutic activities;Patient/family education;Manual techniques;Dry needling;Passive range of motion;Taping    PT Next Visit Plan continue with pain management and address hip pain/bursitits with ionto as needed    PT Home Exercise Plan Access Code: [email or web address]    Consulted and Agree with Plan of Care Patient           Patient will benefit from skilled therapeutic intervention in order to improve the following deficits and impairments:  Abnormal gait,Increased fascial  restricitons,Difficulty walking,Decreased range of motion,Pain,Decreased strength,Postural dysfunction  Visit Diagnosis: Polymyalgia rheumatica ([facility])  Muscle weakness (generalized)  Cramp and spasm  Abnormal posture  Pain in right foot  Pain in left foot     Problem List Patient Active Problem List     Diagnosis Date Noted  . GAD (generalized anxiety disorder) 12/26/2017  . OCD (obsessive compulsive disorder) 12/26/2017  . PTSD (post-traumatic stress disorder) 12/26/2017  . DDD (degenerative disc disease), cervical 07/14/2016  . Primary osteoarthritis of both feet 07/14/2016  . Primary osteoarthritis of both hands 07/14/2016  . Other fatigue 07/14/2016  . History of IBS 07/14/2016  . Osteopenia of multiple sites 07/14/2016  . Fibromyalgia 01/13/2016  . MGUS (monoclonal gammopathy of unknown significance) 12/23/2013  . Chronic cholecystitis without calculus 10/18/2012  . Abdominal pain, unspecified site 10/18/2012  . Nausea alone 10/18/2012  . Unspecified constipation 10/18/2012  . Depression 10/18/2012  . Anxiety   . ASCUS (atypical squamous cells of undetermined significance) on Pap smear   . IUD   . Hemorrhoids 11/29/2010  . Abdominal pain, left upper quadrant 11/29/2010    Jule Ser, PT 05/05/2020, 1:29 PM  [facility] [address] [place], [place], [address] Phone: [phone]   Fax:  [phone]  Name: BABARA BUFFALO MRN: [id number] Date of Birth: 12-21-1960

## 2020-05-05 NOTE — Patient Instructions (Signed)
Access Code: [email or web address] URL: [email or web address] Date: 05/05/2020 Prepared by: Jari Favre  Exercises Seated Toe Raise - 3 x daily - 7 x weekly - 1 sets - 10 reps - 5 sec hold Supine Ankle Circles - 1 x daily - 7 x weekly - 3 sets - 10 reps Slant Board Gastrocnemius Stretch - 1 x daily - 7 x weekly - 3 sets - 10 reps Slant Board Soleus Stretch - 1 x daily - 7 x weekly - 3 sets - 10 reps Tandem Stance with Head Rotation on Foam Pad - 1 x daily - 7 x weekly - 3 sets - 10 reps Standing Repeated Hip Abduction on Foam Pad - 1 x daily - 7 x weekly - 3 sets - 10 reps Seated Wrist Flexion Stretch - 2 x daily - 7 x weekly - 1 sets - 5 reps - 10-15 hold Seated Wrist Extension Stretch - 2 x daily - 7 x weekly - 1 sets - 5 reps - 10-15 hold Standing Radial Nerve Glide - 2 x daily - 7 x weekly - 1 sets - 10 reps - 5 hold Sidelying Open Book Thoracic Lumbar Rotation and Extension - 1 x daily - 7 x weekly - 3 sets - 10 reps Cat-Camel to Child's Pose - 2 x daily - 7 x weekly - 10 reps - 1 sets - 5 hold Child's Pose Stretch - 1 x daily - 7 x weekly - 3 reps - 1 sets - 20 hold Child's Pose with Sidebending - 2 x daily - 7 x weekly - 10 reps - 20 hold Clamshell at Wall - 1 x daily - 7 x weekly - 3 sets - 10 reps Supine Scapular Protraction in Flexion with Dumbbells - 1 x daily - 7 x weekly - 3 sets - 10 reps

## 2020-05-10 ENCOUNTER — Other Ambulatory Visit: Payer: Self-pay | Admitting: Psychiatry

## 2020-05-10 ENCOUNTER — Telehealth: Payer: Self-pay | Admitting: Psychiatry

## 2020-05-10 DIAGNOSIS — F902 Attention-deficit hyperactivity disorder, combined type: Secondary | ICD-10-CM

## 2020-05-10 DIAGNOSIS — F331 Major depressive disorder, recurrent, moderate: Secondary | ICD-10-CM

## 2020-05-10 MED ORDER — METHYLPHENIDATE HCL 10 MG PO TABS: 10.0000 mg | ORAL_TABLET | Freq: Every day | ORAL | 0 refills | Status: DC

## 2020-05-10 NOTE — Telephone Encounter (Signed)
Pt would like a refill on Methylphenidate. Please send to [facility] on [address].

## 2020-05-19 ENCOUNTER — Ambulatory Visit: Payer: Medicare Other | Admitting: Physical Therapy

## 2020-05-20 ENCOUNTER — Other Ambulatory Visit: Payer: Self-pay | Admitting: Psychiatry

## 2020-05-26 ENCOUNTER — Other Ambulatory Visit: Payer: Self-pay

## 2020-05-26 ENCOUNTER — Ambulatory Visit: Payer: Medicare Other

## 2020-05-26 DIAGNOSIS — R293 Abnormal posture: Secondary | ICD-10-CM

## 2020-05-26 DIAGNOSIS — M79671 Pain in right foot: Secondary | ICD-10-CM | POA: Diagnosis not present

## 2020-05-26 DIAGNOSIS — R252 Cramp and spasm: Secondary | ICD-10-CM | POA: Diagnosis not present

## 2020-05-26 DIAGNOSIS — M6281 Muscle weakness (generalized): Secondary | ICD-10-CM | POA: Diagnosis not present

## 2020-05-26 DIAGNOSIS — M79672 Pain in left foot: Secondary | ICD-10-CM | POA: Diagnosis not present

## 2020-05-26 DIAGNOSIS — M353 Polymyalgia rheumatica: Secondary | ICD-10-CM | POA: Diagnosis not present

## 2020-05-26 NOTE — Patient Instructions (Signed)
Access Code: [email or web address] URL: [email or web address] Date: 05/26/2020 Prepared by: Claiborne Billings  Exercises   Seated Transversus Abdominis Bracing - 5 x daily - 7 x weekly - 1 sets - 10 reps Standing Transverse Abdominis Contraction - 1 x daily - 7 x weekly - 3 sets - 10 reps - 5 hold Clamshell - 2 x daily - 7 x weekly - 2 sets - 10 reps

## 2020-05-26 NOTE — Therapy (Signed)
[facility] [address], [place], [place], [address] Phone: [phone]   Fax:  [phone]  Physical Therapy Treatment  Patient Details  Name: Danielle Bruce MRN: [id number] Date of Birth: 1960-05-31 Referring Provider (PT): Mayra Neer, MD   Encounter Date: 05/26/2020   PT End of Session - 05/26/20 1057    Visit Number 14    Date for PT Re-Evaluation 06/21/20    PT Start Time 1017    PT Stop Time 1058    PT Time Calculation (min) 41 min    Activity Tolerance Patient tolerated treatment well    Behavior During Therapy [facility] for tasks assessed/performed           Past Medical History:  Diagnosis Date  . Anemia   . Anxiety   . Arthritis    osteoarthritis  . ASCUS (atypical squamous cells of undetermined significance) on Pap smear 05/06/05   NEG HIGH RISK HPV--C&B BIOPSY BENIGN 12/2005  . Asthma   . Bipolar 2 disorder ([place])   . Cancer ([place])    skin cancer - basal cell  . Colon polyps   . Complication of anesthesia    anxious afterwards, will get headaches   . Constipation   . Depression   . Fibromyalgia 10/2013  . GERD (gastroesophageal reflux disease)   . 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 Heart murmur    never had any problems  . Hemorrhoids   . High cholesterol   . 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   cytology negative  . IBS (irritable bowel syndrome)   . Insomnia   . Lymphocytic colitis   . Lymphoma ([place])   . 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 Migraines   . Osteoarthritis   . Osteopenia   . Peripheral neuropathy   . PTSD (post-traumatic stress disorder)     Past Surgical History:  Procedure Laterality Date  . BONE MARROW BIOPSY Left 12/18/2013   Plasma cell dyscrasia 8% population of plasma cells  . BREAST BIOPSY Right    benign stereo  . CESAREAN SECTION  [phone]  . CHOLECYSTECTOMY N/A 10/16/2012    Procedure: LAPAROSCOPIC CHOLECYSTECTOMY;  Surgeon: Joyice Faster. Cornett, MD;  [facility];  Service: General;  Laterality: N/A;  . COLONOSCOPY     numerous times  . DILATION AND CURETTAGE OF UTERUS    . ESOPHAGOGASTRODUODENOSCOPY    . [place]   x3  . IUD REMOVAL  02/2015   Mirena  . LAPAROSCOPIC LYSIS OF ADHESIONS N/A 10/16/2012   Procedure: LAPAROSCOPIC LYSIS OF ADHESIONS;  Surgeon: Joyice Faster. Cornett, MD;  [facility];  Service: General;  Laterality: N/A;  . LAPAROSCOPY N/A 10/16/2012   Procedure: LAPAROSCOPY DIAGNOSTIC;  Surgeon: Joyice Faster. Cornett, MD;  [facility];  Service: General;  Laterality: N/A;  . PELVIC LAPAROSCOPY    . RADIOLOGY WITH ANESTHESIA N/A 12/16/2015   Procedure: MRI OF BRAIN WITH AND WITHOUT CONTRAST;  Surgeon: Medication Radiologist, MD;  Location: [facility];  Service: Radiology;  Laterality: N/A;  . SHOULDER SURGERY  2007/2008  . SPINE SURGERY  2010   cervical    There were no vitals filed for this visit.   Subjective Assessment - 05/26/20 1020    Subjective I need to get good walking shoes soon.  My hands and feet have been showing improvement.  I have been carrying babies  around and it is a lot.  My low back has been bothering me.    Pertinent History goes by "[name]" ; OA bilateral feet; fibromyalgia; off cycle of steriod medicine currenlty    Currently in Pain? Yes    Pain Score 4     Pain Location Back    Pain Orientation Right;Left    Pain Descriptors / Indicators Aching    Pain Type Acute pain    Pain Onset More than a month ago    Pain Frequency Intermittent    Aggravating Factors  walking, moving, care of grandchildren    Pain Relieving Factors Tens, Voltaren, pain patches                             [facility] Adult PT Treatment/Exercise - 05/26/20 0001      Exercises   Exercises Lumbar      Lumbar Exercises: Stretches   Lower Trunk Rotation 3 reps;20 seconds      Lumbar Exercises: Supine   Ab Set 10  reps      Lumbar Exercises: Sidelying   Clam 10 reps    Clam Limitations ab bracing      Manual Therapy   Manual Therapy Soft tissue mobilization;Myofascial release    Myofascial Release bil lumbar spine and gluteals                  PT Education - 05/26/20 1028    Education Details Access Code: [email or web address]    Person(s) Educated Patient    Methods Explanation;Demonstration;Handout    Comprehension Verbalized understanding;Returned demonstration            PT Short Term Goals - 03/29/20 1030      PT SHORT TERM GOAL #1   Title be independent in initial HEP    Status Achieved             PT Long Term Goals - 04/28/20 1324      PT LONG TERM GOAL #1   Title be independent in advanced HEP    Status On-going      PT LONG TERM GOAL #2   Title Pt will be able to walk at least 1 mile without increased pain so she can return to birding    Baseline increased pain in calf when walking (left side)    Status On-going                 Plan - 05/26/20 1035    Clinical Impression Statement Pt with lapse in treatment due to inability to get appts.  Pt has been staying with her daughter and caring for her grandchildren.  Pt has had a flare-up of low back pain with these activities.  Pt added transverse abdominus activation and sidelying clams to HEP to improve stability and strength.  Pt demonstrated reduced core strength with clams and required tactile and verbal cues for technique.  Pt with tension in bil lumbar paraspinals and gluteals and responded well to manual therapy today.  Pt will continue to benefit from skilled PT to address chronic widespread pain, weakness and flexibility deficits.    PT Frequency 1x / week    PT Duration 12 weeks    PT Treatment/Interventions ADLs/Self Care Home Management;Cryotherapy;Electrical Stimulation;Iontophoresis 56m/ml Dexamethasone;Moist Heat;Ultrasound;Neuromuscular re-education;Therapeutic exercise;Therapeutic  activities;Patient/family education;Manual techniques;Dry needling;Passive range of motion;Taping    PT Next Visit Plan continue with pain management and address hip pain/bursitits with ionto as needed  PT Home Exercise Plan Access Code: [email or web address]    Consulted and Agree with Plan of Care Patient           Patient will benefit from skilled therapeutic intervention in order to improve the following deficits and impairments:  Abnormal gait,Increased fascial restricitons,Difficulty walking,Decreased range of motion,Pain,Decreased strength,Postural dysfunction  Visit Diagnosis: Polymyalgia rheumatica (HCC)  Muscle weakness (generalized)  Cramp and spasm  Abnormal posture     Problem List Patient Active Problem List   Diagnosis Date Noted  . GAD (generalized anxiety disorder) 12/26/2017  . OCD (obsessive compulsive disorder) 12/26/2017  . PTSD (post-traumatic stress disorder) 12/26/2017  . DDD (degenerative disc disease), cervical 07/14/2016  . Primary osteoarthritis of both feet 07/14/2016  . Primary osteoarthritis of both hands 07/14/2016  . Other fatigue 07/14/2016  . History of IBS 07/14/2016  . Osteopenia of multiple sites 07/14/2016  . Fibromyalgia 01/13/2016  . MGUS (monoclonal gammopathy of unknown significance) 12/23/2013  . Chronic cholecystitis without calculus 10/18/2012  . Abdominal pain, unspecified site 10/18/2012  . Nausea alone 10/18/2012  . Unspecified constipation 10/18/2012  . Depression 10/18/2012  . Anxiety   . ASCUS (atypical squamous cells of undetermined significance) on Pap smear   . IUD   . Hemorrhoids 11/29/2010  . Abdominal pain, left upper quadrant 11/29/2010     Sigurd Sos, PT 05/26/20 10:59 AM  [facility] [address] [place], [place], [address] Phone: [phone]   Fax:  [phone]  Name: Danielle Bruce MRN: [id number] Date of Birth: [birth year]

## 2020-06-02 ENCOUNTER — Other Ambulatory Visit: Payer: Self-pay

## 2020-06-02 ENCOUNTER — Ambulatory Visit: Payer: Medicare Other | Attending: Family Medicine

## 2020-06-02 DIAGNOSIS — M6281 Muscle weakness (generalized): Secondary | ICD-10-CM

## 2020-06-02 DIAGNOSIS — R252 Cramp and spasm: Secondary | ICD-10-CM | POA: Diagnosis not present

## 2020-06-02 DIAGNOSIS — M353 Polymyalgia rheumatica: Secondary | ICD-10-CM | POA: Insufficient documentation

## 2020-06-02 DIAGNOSIS — M79671 Pain in right foot: Secondary | ICD-10-CM | POA: Insufficient documentation

## 2020-06-02 DIAGNOSIS — R293 Abnormal posture: Secondary | ICD-10-CM | POA: Insufficient documentation

## 2020-06-02 DIAGNOSIS — M79672 Pain in left foot: Secondary | ICD-10-CM | POA: Diagnosis not present

## 2020-06-02 NOTE — Therapy (Signed)
[facility] [address], [place], [place], [address] Phone: [phone]   Fax:  [phone]  Physical Therapy Treatment  Patient Details  Name: Danielle Bruce MRN: [id number] Date of Birth: 1960/12/05 Referring Provider (PT): Mayra Neer, MD   Encounter Date: 06/02/2020   PT End of Session - 06/02/20 1228    Visit Number 15    Date for PT Re-Evaluation 06/21/20    Authorization Type Medicare/Medicaid    PT Start Time 1147    PT Stop Time 1227    PT Time Calculation (min) 40 min    Activity Tolerance Patient tolerated treatment well    Behavior During Therapy [facility] for tasks assessed/performed           Past Medical History:  Diagnosis Date  . Anemia   . Anxiety   . Arthritis    osteoarthritis  . ASCUS (atypical squamous cells of undetermined significance) on Pap smear 05/06/05   NEG HIGH RISK HPV--C&B BIOPSY BENIGN 12/2005  . Asthma   . Bipolar 2 disorder ([place])   . Cancer ([place])    skin cancer - basal cell  . Colon polyps   . Complication of anesthesia    anxious afterwards, will get headaches   . Constipation   . Depression   . Fibromyalgia 10/2013  . GERD (gastroesophageal reflux disease)   . 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 Heart murmur    never had any problems  . Hemorrhoids   . High cholesterol   . 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   cytology negative  . IBS (irritable bowel syndrome)   . Insomnia   . Lymphocytic colitis   . Lymphoma ([place])   . 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 Migraines   . Osteoarthritis   . Osteopenia   . Peripheral neuropathy   . PTSD (post-traumatic stress disorder)     Past Surgical History:  Procedure Laterality Date  . BONE MARROW BIOPSY Left 12/18/2013   Plasma cell dyscrasia 8% population of plasma cells  . BREAST BIOPSY Right    benign stereo  . CESAREAN SECTION  [phone]   . CHOLECYSTECTOMY N/A 10/16/2012   Procedure: LAPAROSCOPIC CHOLECYSTECTOMY;  Surgeon: Joyice Faster. Cornett, MD;  [facility];  Service: General;  Laterality: N/A;  . COLONOSCOPY     numerous times  . DILATION AND CURETTAGE OF UTERUS    . ESOPHAGOGASTRODUODENOSCOPY    . [place]   x3  . IUD REMOVAL  02/2015   Mirena  . LAPAROSCOPIC LYSIS OF ADHESIONS N/A 10/16/2012   Procedure: LAPAROSCOPIC LYSIS OF ADHESIONS;  Surgeon: Joyice Faster. Cornett, MD;  [facility];  Service: General;  Laterality: N/A;  . LAPAROSCOPY N/A 10/16/2012   Procedure: LAPAROSCOPY DIAGNOSTIC;  Surgeon: Joyice Faster. Cornett, MD;  [facility];  Service: General;  Laterality: N/A;  . PELVIC LAPAROSCOPY    . RADIOLOGY WITH ANESTHESIA N/A 12/16/2015   Procedure: MRI OF BRAIN WITH AND WITHOUT CONTRAST;  Surgeon: Medication Radiologist, MD;  Location: [facility];  Service: Radiology;  Laterality: N/A;  . SHOULDER SURGERY  2007/2008  . SPINE SURGERY  2010   cervical    There were no vitals filed for this visit.   Subjective Assessment - 06/02/20 1148    Subjective I'm better overall because I am back at my house and haven't been lifting/carrying the babies as  much.  My arms and feet are doing much better overall.    Pertinent History goes by "[name]" ; OA bilateral feet; fibromyalgia; off cycle of steriod medicine currenlty    Patient Stated Goals Pt wants pain management and be able to move more without taking medication    Currently in Pain? Yes    Pain Score 3     Pain Location Back    Pain Orientation Lower;Left    Pain Descriptors / Indicators Aching    Pain Type Acute pain    Pain Onset More than a month ago    Pain Frequency Intermittent    Aggravating Factors  walkng, lifting/carrying    Pain Relieving Factors TENs, Voltaren, pain patches                             [facility] Adult PT Treatment/Exercise - 06/02/20 0001      Manual Therapy   Manual Therapy Soft tissue  mobilization;Myofascial release    Myofascial Release bil lumbar spine and gluteals                  PT Education - 06/02/20 1159    Education Details lawn cart to improve mechanics, body mechanics with laundry    Person(s) Educated Patient    Methods Explanation;Demonstration    Comprehension Verbalized understanding            PT Short Term Goals - 03/29/20 1030      PT SHORT TERM GOAL #1   Title be independent in initial HEP    Status Achieved             PT Long Term Goals - 06/02/20 1151      PT LONG TERM GOAL #1   Title be independent in advanced HEP    Status On-going      PT LONG TERM GOAL #2   Title Pt will be able to walk at least 1 mile without increased pain so she can return to birding    Baseline depends on shoes    Status On-going      PT LONG TERM GOAL #3   Title report 75% less neck/UE pain throughout the day with normal functional activities      PT LONG TERM GOAL #4   Title Pt will be able to hold her grandchildren for at least 1 hour throughout the day in order to help her daughter with child care    Status On-going                 Plan - 06/02/20 1227    Clinical Impression Statement Pt arrives reporting overall reduction in bil. Arm/wrist pain and foot pain with minimal to no pain reported now.  LBP is overall improved since last week due to being back home and not caring for grandchildren as much.  Pt is walking 1 mile at a time and is doing this daily.  Pt is addressing body mechanics modifications with home tasks and PT discussed modifications with laundry and gardening at home.  Session focused on addressing lumbar pain and soft tissue mobility.  Pt with trigger points in the Lt gluteals and PT discussed DN with pt.  Pt demonstrated improved tissue mobility and reduced tension after manual therapy today.  Pt will continue to benefit from skilled PT to address chronic pain.    Rehab Potential Good    PT Frequency 1x / week  PT Duration 12 weeks    PT Treatment/Interventions ADLs/Self Care Home Management;Cryotherapy;Electrical Stimulation;Iontophoresis 59m/ml Dexamethasone;Moist Heat;Ultrasound;Neuromuscular re-education;Therapeutic exercise;Therapeutic activities;Patient/family education;Manual techniques;Dry needling;Passive range of motion;Taping    PT Next Visit Plan continue with pain management and address hip pain, lumbar pain and tissue mobility    PT Home Exercise Plan Access Code: [email or web address]    Consulted and Agree with Plan of Care Patient           Patient will benefit from skilled therapeutic intervention in order to improve the following deficits and impairments:  Abnormal gait,Increased fascial restricitons,Difficulty walking,Decreased range of motion,Pain,Decreased strength,Postural dysfunction  Visit Diagnosis: Muscle weakness (generalized)  Polymyalgia rheumatica (HCC)  Cramp and spasm     Problem List Patient Active Problem List   Diagnosis Date Noted  . GAD (generalized anxiety disorder) 12/26/2017  . OCD (obsessive compulsive disorder) 12/26/2017  . PTSD (post-traumatic stress disorder) 12/26/2017  . DDD (degenerative disc disease), cervical 07/14/2016  . Primary osteoarthritis of both feet 07/14/2016  . Primary osteoarthritis of both hands 07/14/2016  . Other fatigue 07/14/2016  . History of IBS 07/14/2016  . Osteopenia of multiple sites 07/14/2016  . Fibromyalgia 01/13/2016  . MGUS (monoclonal gammopathy of unknown significance) 12/23/2013  . Chronic cholecystitis without calculus 10/18/2012  . Abdominal pain, unspecified site 10/18/2012  . Nausea alone 10/18/2012  . Unspecified constipation 10/18/2012  . Depression 10/18/2012  . Anxiety   . ASCUS (atypical squamous cells of undetermined significance) on Pap smear   . IUD   . Hemorrhoids 11/29/2010  . Abdominal pain, left upper quadrant 11/29/2010    KSigurd Sos PT 06/02/20 12:32 PM  [facility] [address] [place] [address]Phone: [phone]  Fax:  [phone] Name: CORIS CALMESMRN: [id number] of Birth: 912/10/1960

## 2020-06-07 DIAGNOSIS — J309 Allergic rhinitis, unspecified: Secondary | ICD-10-CM | POA: Diagnosis not present

## 2020-06-07 DIAGNOSIS — F411 Generalized anxiety disorder: Secondary | ICD-10-CM | POA: Diagnosis not present

## 2020-06-07 DIAGNOSIS — D472 Monoclonal gammopathy: Secondary | ICD-10-CM | POA: Diagnosis not present

## 2020-06-07 DIAGNOSIS — M353 Polymyalgia rheumatica: Secondary | ICD-10-CM | POA: Diagnosis not present

## 2020-06-07 DIAGNOSIS — K760 Fatty (change of) liver, not elsewhere classified: Secondary | ICD-10-CM | POA: Diagnosis not present

## 2020-06-07 DIAGNOSIS — E7800 Pure hypercholesterolemia, unspecified: Secondary | ICD-10-CM | POA: Diagnosis not present

## 2020-06-07 DIAGNOSIS — G43909 Migraine, unspecified, not intractable, without status migrainosus: Secondary | ICD-10-CM | POA: Diagnosis not present

## 2020-06-07 DIAGNOSIS — M797 Fibromyalgia: Secondary | ICD-10-CM | POA: Diagnosis not present

## 2020-06-07 DIAGNOSIS — F900 Attention-deficit hyperactivity disorder, predominantly inattentive type: Secondary | ICD-10-CM | POA: Diagnosis not present

## 2020-06-07 DIAGNOSIS — R7301 Impaired fasting glucose: Secondary | ICD-10-CM | POA: Diagnosis not present

## 2020-06-09 ENCOUNTER — Other Ambulatory Visit: Payer: Self-pay

## 2020-06-09 ENCOUNTER — Ambulatory Visit: Payer: Medicare Other

## 2020-06-09 DIAGNOSIS — M79672 Pain in left foot: Secondary | ICD-10-CM

## 2020-06-09 DIAGNOSIS — M6281 Muscle weakness (generalized): Secondary | ICD-10-CM | POA: Diagnosis not present

## 2020-06-09 DIAGNOSIS — M353 Polymyalgia rheumatica: Secondary | ICD-10-CM

## 2020-06-09 DIAGNOSIS — R293 Abnormal posture: Secondary | ICD-10-CM | POA: Diagnosis not present

## 2020-06-09 DIAGNOSIS — R252 Cramp and spasm: Secondary | ICD-10-CM

## 2020-06-09 DIAGNOSIS — M79671 Pain in right foot: Secondary | ICD-10-CM | POA: Diagnosis not present

## 2020-06-09 NOTE — Therapy (Signed)
[facility] [address], [place], [place], [address] Phone: [phone]   Fax:  [phone]  Physical Therapy Treatment  Patient Details  Name: Danielle Bruce MRN: [id number] Date of Birth: 06-13-1960 Referring Provider (PT): Mayra Neer, MD   Encounter Date: 06/09/2020   PT End of Session - 06/09/20 1233    Visit Number 16    Date for PT Re-Evaluation 06/21/20    Authorization Type Medicare/Medicaid    PT Start Time 1145    PT Stop Time 1230    PT Time Calculation (min) 45 min    Activity Tolerance Patient tolerated treatment well    Behavior During Therapy [facility] for tasks assessed/performed           Past Medical History:  Diagnosis Date  . Anemia   . Anxiety   . Arthritis    osteoarthritis  . ASCUS (atypical squamous cells of undetermined significance) on Pap smear 05/06/05   NEG HIGH RISK HPV--C&B BIOPSY BENIGN 12/2005  . Asthma   . Bipolar 2 disorder ([place])   . Cancer ([place])    skin cancer - basal cell  . Colon polyps   . Complication of anesthesia    anxious afterwards, will get headaches   . Constipation   . Depression   . Fibromyalgia 10/2013  . GERD (gastroesophageal reflux disease)   . 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 Heart murmur    never had any problems  . Hemorrhoids   . High cholesterol   . 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   cytology negative  . IBS (irritable bowel syndrome)   . Insomnia   . Lymphocytic colitis   . Lymphoma ([place])   . 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 Migraines   . Osteoarthritis   . Osteopenia   . Peripheral neuropathy   . PTSD (post-traumatic stress disorder)     Past Surgical History:  Procedure Laterality Date  . BONE MARROW BIOPSY Left 12/18/2013   Plasma cell dyscrasia 8% population of plasma cells  . BREAST BIOPSY Right    benign stereo  . CESAREAN SECTION  [phone]   . CHOLECYSTECTOMY N/A 10/16/2012   Procedure: LAPAROSCOPIC CHOLECYSTECTOMY;  Surgeon: Joyice Faster. Cornett, MD;  [facility];  Service: General;  Laterality: N/A;  . COLONOSCOPY     numerous times  . DILATION AND CURETTAGE OF UTERUS    . ESOPHAGOGASTRODUODENOSCOPY    . [place]   x3  . IUD REMOVAL  02/2015   Mirena  . LAPAROSCOPIC LYSIS OF ADHESIONS N/A 10/16/2012   Procedure: LAPAROSCOPIC LYSIS OF ADHESIONS;  Surgeon: Joyice Faster. Cornett, MD;  [facility];  Service: General;  Laterality: N/A;  . LAPAROSCOPY N/A 10/16/2012   Procedure: LAPAROSCOPY DIAGNOSTIC;  Surgeon: Joyice Faster. Cornett, MD;  [facility];  Service: General;  Laterality: N/A;  . PELVIC LAPAROSCOPY    . RADIOLOGY WITH ANESTHESIA N/A 12/16/2015   Procedure: MRI OF BRAIN WITH AND WITHOUT CONTRAST;  Surgeon: Medication Radiologist, MD;  Location: [facility];  Service: Radiology;  Laterality: N/A;  . SHOULDER SURGERY  2007/2008  . SPINE SURGERY  2010   cervical    There were no vitals filed for this visit.   Subjective Assessment - 06/09/20 1149    Subjective I've been back and forth from my daughter's house.  I lifted my 58 year old grandson  while lifting carrying the baby.  My spine and neck were strained.    Patient Stated Goals Pt wants pain management and be able to move more without taking medication    Currently in Pain? Yes    Pain Score 5    sitting down 3-4/10   Pain Location Neck    Pain Orientation Lower;Mid;Right;Left    Pain Descriptors / Indicators Aching;Tightness    Pain Type Acute pain    Pain Onset More than a month ago    Pain Frequency Intermittent    Aggravating Factors  walking, lifting/carrying    Pain Relieving Factors TENs, voltaren, pain patches                             [facility] Adult PT Treatment/Exercise - 06/09/20 0001      Modalities   Modalities Iontophoresis      Iontophoresis   Type of Iontophoresis Dexamethasone    Location Lt trochanteric  bursa    Dose 1.0 cc    Time 6 hour patch      Manual Therapy   Manual Therapy Soft tissue mobilization;Myofascial release    Myofascial Release bil upper traps, Lt gluteals- elongation and release            Trigger Point Dry Needling - 06/09/20 0001    Consent Given? Yes    Education Handout Provided Yes    Muscles Treated Head and Neck Upper trapezius   Rt and Lt   Muscles Treated Back/Hip Gluteus minimus;Gluteus medius   Lt only   Upper Trapezius Response Twitch reponse elicited;Palpable increased muscle length    Gluteus Minimus Response Twitch response elicited;Palpable increased muscle length    Gluteus Medius Response Twitch response elicited;Palpable increased muscle length                PT Education - 06/09/20 1227    Education Details DN info    Person(s) Educated Patient    Methods Explanation;Handout    Comprehension Verbalized understanding            PT Short Term Goals - 03/29/20 1030      PT SHORT TERM GOAL #1   Title be independent in initial HEP    Status Achieved             PT Long Term Goals - 06/02/20 1151      PT LONG TERM GOAL #1   Title be independent in advanced HEP    Status On-going      PT LONG TERM GOAL #2   Title Pt will be able to walk at least 1 mile without increased pain so she can return to birding    Baseline depends on shoes    Status On-going      PT LONG TERM GOAL #3   Title report 75% less neck/UE pain throughout the day with normal functional activities      PT LONG TERM GOAL #4   Title Pt will be able to hold her grandchildren for at least 1 hour throughout the day in order to help her daughter with child care    Status On-going                 Plan - 06/09/20 1232    Clinical Impression Statement Pt is working to Bi[employer], clinical adjustments with care of grandchildren and yardwork.  Pt was agreeable to dry needling today to bil upper traps  and Lt gluteals.  Pt with trigger points in Lt  gluteals and bil upper traps and demonstrated improved tissue mobility and reduced pain s/p manual therapy today.  Pt will stretch over the next 2 days and use heat as needed for soreness.  Pt will continue to benefit from skilled PT to address strength, flexibility and pain.    PT Frequency 1x / week    PT Duration 12 weeks    PT Treatment/Interventions ADLs/Self Care Home Management;Cryotherapy;Electrical Stimulation;Iontophoresis 6m/ml Dexamethasone;Moist Heat;Ultrasound;Neuromuscular re-education;Therapeutic exercise;Therapeutic activities;Patient/family education;Manual techniques;Dry needling;Passive range of motion;Taping    PT Next Visit Plan DN again if pt tolerated well.  Manual, strength and flexiblity.  Ionto #2    PT Home Exercise Plan Access Code: [email or web address]    Recommended Other Services recert is signed.    Consulted and Agree with Plan of Care Patient           Patient will benefit from skilled therapeutic intervention in order to improve the following deficits and impairments:  Abnormal gait,Increased fascial restricitons,Difficulty walking,Decreased range of motion,Pain,Decreased strength,Postural dysfunction  Visit Diagnosis: Muscle weakness (generalized)  Polymyalgia rheumatica (HCC)  Cramp and spasm  Abnormal posture  Pain in right foot  Pain in left foot     Problem List Patient Active Problem List   Diagnosis Date Noted  . GAD (generalized anxiety disorder) 12/26/2017  . OCD (obsessive compulsive disorder) 12/26/2017  . PTSD (post-traumatic stress disorder) 12/26/2017  . DDD (degenerative disc disease), cervical 07/14/2016  . Primary osteoarthritis of both feet 07/14/2016  . Primary osteoarthritis of both hands 07/14/2016  . Other fatigue 07/14/2016  . History of IBS 07/14/2016  . Osteopenia of multiple sites 07/14/2016  . Fibromyalgia 01/13/2016  . MGUS (monoclonal gammopathy of unknown significance) 12/23/2013  . Chronic cholecystitis without  calculus 10/18/2012  . Abdominal pain, unspecified site 10/18/2012  . Nausea alone 10/18/2012  . Unspecified constipation 10/18/2012  . Depression 10/18/2012  . Anxiety   . ASCUS (atypical squamous cells of undetermined significance) on Pap smear   . IUD   . Hemorrhoids 11/29/2010  . Abdominal pain, left upper quadrant 11/29/2010     KSigurd Sos PT 06/09/20 1:20 PM  [facility] [address] [place] [address]Phone: [phone]  Fax:  [phone] Name: CSHAMECKA HOCUTTMRN: [id number] of Birth: [birth year]

## 2020-06-09 NOTE — Patient Instructions (Signed)

## 2020-06-15 ENCOUNTER — Ambulatory Visit: Payer: Medicare Other | Admitting: Physical Therapy

## 2020-06-15 ENCOUNTER — Other Ambulatory Visit: Payer: Self-pay

## 2020-06-15 ENCOUNTER — Encounter: Payer: Self-pay | Admitting: Physical Therapy

## 2020-06-15 DIAGNOSIS — M6281 Muscle weakness (generalized): Secondary | ICD-10-CM | POA: Diagnosis not present

## 2020-06-15 DIAGNOSIS — M353 Polymyalgia rheumatica: Secondary | ICD-10-CM | POA: Diagnosis not present

## 2020-06-15 DIAGNOSIS — R293 Abnormal posture: Secondary | ICD-10-CM

## 2020-06-15 DIAGNOSIS — R252 Cramp and spasm: Secondary | ICD-10-CM

## 2020-06-15 DIAGNOSIS — M79671 Pain in right foot: Secondary | ICD-10-CM | POA: Diagnosis not present

## 2020-06-15 DIAGNOSIS — M79672 Pain in left foot: Secondary | ICD-10-CM

## 2020-06-15 NOTE — Therapy (Signed)
[facility] [address] [place], [place], [address] Phone: [phone]   Fax:  [phone]  Physical Therapy Treatment  Patient Details  Name: Danielle Bruce MRN: [id number] Date of Birth: [birth year] Referring Provider (PT): Mayra Neer, MD   Encounter Date: 06/15/2020   PT End of Session - 06/15/20 1723    Visit Number 17    Date for PT Re-Evaluation 08/10/20    Authorization Type Medicare/Medicaid    PT Start Time 1530    PT Stop Time 1614    PT Time Calculation (min) 44 min    Activity Tolerance Patient tolerated treatment well    Behavior During Therapy [facility] for tasks assessed/performed           Past Medical History:  Diagnosis Date  . Anemia   . Anxiety   . Arthritis    osteoarthritis  . ASCUS (atypical squamous cells of undetermined significance) on Pap smear 05/06/05   NEG HIGH RISK HPV--C&B BIOPSY BENIGN 12/2005  . Asthma   . Bipolar 2 disorder ([place])   . Cancer ([place])    skin cancer - basal cell  . Colon polyps   . Complication of anesthesia    anxious afterwards, will get headaches   . Constipation   . Depression   . Fibromyalgia 10/2013  . GERD (gastroesophageal reflux disease)   . 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 Heart murmur    never had any problems  . Hemorrhoids   . High cholesterol   . 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   cytology negative  . IBS (irritable bowel syndrome)   . Insomnia   . Lymphocytic colitis   . Lymphoma ([place])   . 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 Migraines   . Osteoarthritis   . Osteopenia   . Peripheral neuropathy   . PTSD (post-traumatic stress disorder)     Past Surgical History:  Procedure Laterality Date  . BONE MARROW BIOPSY Left 12/18/2013   Plasma cell dyscrasia 8% population of plasma cells  . BREAST BIOPSY Right    benign stereo  . CESAREAN SECTION  [phone]   . CHOLECYSTECTOMY N/A 10/16/2012   Procedure: LAPAROSCOPIC CHOLECYSTECTOMY;  Surgeon: Joyice Faster. Cornett, MD;  [facility];  Service: General;  Laterality: N/A;  . COLONOSCOPY     numerous times  . DILATION AND CURETTAGE OF UTERUS    . ESOPHAGOGASTRODUODENOSCOPY    . [place]   x3  . IUD REMOVAL  02/2015   Mirena  . LAPAROSCOPIC LYSIS OF ADHESIONS N/A 10/16/2012   Procedure: LAPAROSCOPIC LYSIS OF ADHESIONS;  Surgeon: Joyice Faster. Cornett, MD;  [facility];  Service: General;  Laterality: N/A;  . LAPAROSCOPY N/A 10/16/2012   Procedure: LAPAROSCOPY DIAGNOSTIC;  Surgeon: Joyice Faster. Cornett, MD;  [facility];  Service: General;  Laterality: N/A;  . PELVIC LAPAROSCOPY    . RADIOLOGY WITH ANESTHESIA N/A 12/16/2015   Procedure: MRI OF BRAIN WITH AND WITHOUT CONTRAST;  Surgeon: Medication Radiologist, MD;  Location: [place];  Service: Radiology;  Laterality: N/A;  . SHOULDER SURGERY  2007/2008  . SPINE SURGERY  2010   cervical    There were no vitals filed for this visit.   Subjective Assessment - 06/15/20 1544    Subjective Pt states HA is 6/10 today and just not feeling good because of that.  states she  is also having pain in Rt thigh posteriorly 5/10 and Lt hip is 4-5/10.  Pt states the              [facility] PT Assessment - 06/15/20 0001      Assessment   Medical Diagnosis M77.41,M77.42 (ICD-10-CM) - Metatarsalgia of both feet; M35.3 (ICD-10-CM) - Polymyalgia rheumatica    Referring Provider (PT) Mayra Neer, MD      Strength   Right Wrist Extension 5/5    Left Wrist Extension 5/5    Right Hip ABduction 4/5    Right Hip ADduction 4/5    Left Hip ABduction 4/5   +pain   Left Hip ADduction 4/5                         [facility] Adult PT Treatment/Exercise - 06/15/20 0001      Iontophoresis   Type of Iontophoresis Dexamethasone    Location Lt trochanteric bursa    Dose 1.0 cc    Time 6 hour patch      Manual Therapy   Myofascial Release  suboccipitals, cervical paraspinals, upper thoracic and bil clavicals with one hand anterior and one hand posterior into all 6 fascial planes                    PT Short Term Goals - 03/29/20 1030      PT SHORT TERM GOAL #1   Title be independent in initial HEP    Status Achieved             PT Long Term Goals - 06/15/20 1536      PT LONG TERM GOAL #1   Title be independent in advanced HEP    Time 8    Period Weeks    Status On-going    Target Date 08/10/20      PT LONG TERM GOAL #2   Title Pt will be able to walk at least 1 mile without increased pain so she can return to birding    Baseline I can walk 1 mile    Status Achieved      PT LONG TERM GOAL #3   Title report 75% less neck/UE pain throughout the day with normal functional activities    Baseline not as painful and can do more stuff    Status Partially Met      PT LONG TERM GOAL #4   Title Pt will be able to hold her grandchildren for at least 1 hour throughout the day in order to help her daughter with child care    Baseline I can hold them for an hour throughout the day    Status Achieved      PT LONG TERM GOAL #5   Title Pt will demonstrate at least 4+/5 wrist and hip strength for improved stability for walking and lifting    Baseline 4+/5 wrist; 4/5 hip strength on Lt side    Status Partially Met                 Plan - 06/15/20 1724    Clinical Impression Statement Pt has met several goals.  At this time she is still having pain in hip and today was a bad HA day.  Pt just began ionto for hip pain and dry needling.  She had good response from both therapies, but has not had enough time to experience the full effects of these treatments as well as needing  to continue to work on hip strength.  She will benefit from skilled PT for 4 more visits in order to maximize functional gains from therapy.    Rehab Potential Excellent    PT Frequency 1x / week    PT Duration 8 weeks    PT  Treatment/Interventions ADLs/Self Care Home Management;Cryotherapy;Electrical Stimulation;Iontophoresis 88m/ml Dexamethasone;Moist Heat;Ultrasound;Neuromuscular re-education;Therapeutic exercise;Therapeutic activities;Patient/family education;Manual techniques;Dry needling;Passive range of motion;Taping    PT Next Visit Plan DN again if pt tolerated well.  Manual, strength and flexiblity.  Ionto #3    PT Home Exercise Plan Access Code: [email or web address]    Consulted and Agree with Plan of Care Patient           Patient will benefit from skilled therapeutic intervention in order to improve the following deficits and impairments:  Abnormal gait,Increased fascial restricitons,Difficulty walking,Decreased range of motion,Pain,Decreased strength,Postural dysfunction  Visit Diagnosis: Muscle weakness (generalized)  Polymyalgia rheumatica (HCC)  Cramp and spasm  Abnormal posture  Pain in right foot  Pain in left foot     Problem List Patient Active Problem List   Diagnosis Date Noted  . GAD (generalized anxiety disorder) 12/26/2017  . OCD (obsessive compulsive disorder) 12/26/2017  . PTSD (post-traumatic stress disorder) 12/26/2017  . DDD (degenerative disc disease), cervical 07/14/2016  . Primary osteoarthritis of both feet 07/14/2016  . Primary osteoarthritis of both hands 07/14/2016  . Other fatigue 07/14/2016  . History of IBS 07/14/2016  . Osteopenia of multiple sites 07/14/2016  . Fibromyalgia 01/13/2016  . MGUS (monoclonal gammopathy of unknown significance) 12/23/2013  . Chronic cholecystitis without calculus 10/18/2012  . Abdominal pain, unspecified site 10/18/2012  . Nausea alone 10/18/2012  . Unspecified constipation 10/18/2012  . Depression 10/18/2012  . Anxiety   . ASCUS (atypical squamous cells of undetermined significance) on Pap smear   . IUD   . Hemorrhoids 11/29/2010  . Abdominal pain, left upper quadrant 11/29/2010    JJule Ser PT 06/15/2020, 5:31  PM  [facility] [address] [place] [address]Phone: [phone]  Fax:  [phone] Name: Danielle WINDTMRN: [id number] of Birth: 907/18/1962

## 2020-06-15 NOTE — Progress Notes (Deleted)
Office Visit Note  Patient: Danielle Bruce             Date of Birth: 1960/07/06           MRN: [id number]             PCP: Mayra Neer, MD Referring: Mayra Neer, MD Visit Date: 06/29/2020 Occupation: @GUAROCC @  Subjective:  No chief complaint on file.   History of Present Illness: Danielle Bruce is a 60 y.o. female ***   Activities of Daily Living:  Patient reports morning stiffness for *** {minute/hour:19697}.   Patient {ACTIONS;DENIES/REPORTS:21021675::"Denies"} nocturnal pain.  Difficulty dressing/grooming: {ACTIONS;DENIES/REPORTS:21021675::"Denies"} Difficulty climbing stairs: {ACTIONS;DENIES/REPORTS:21021675::"Denies"} Difficulty getting out of chair: {ACTIONS;DENIES/REPORTS:21021675::"Denies"} Difficulty using hands for taps, buttons, cutlery, and/or writing: {ACTIONS;DENIES/REPORTS:21021675::"Denies"}  No Rheumatology ROS completed.   PMFS History:  Patient Active Problem List   Diagnosis Date Noted  . GAD (generalized anxiety disorder) 12/26/2017  . OCD (obsessive compulsive disorder) 12/26/2017  . PTSD (post-traumatic stress disorder) 12/26/2017  . DDD (degenerative disc disease), cervical 07/14/2016  . Primary osteoarthritis of both feet 07/14/2016  . Primary osteoarthritis of both hands 07/14/2016  . Other fatigue 07/14/2016  . History of IBS 07/14/2016  . Osteopenia of multiple sites 07/14/2016  . Fibromyalgia 01/13/2016  . MGUS (monoclonal gammopathy of unknown significance) 12/23/2013  . Chronic cholecystitis without calculus 10/18/2012  . Abdominal pain, unspecified site 10/18/2012  . Nausea alone 10/18/2012  . Unspecified constipation 10/18/2012  . Depression 10/18/2012  . Anxiety   . ASCUS (atypical squamous cells of undetermined significance) on Pap smear   . IUD   . Hemorrhoids 11/29/2010  . Abdominal pain, left upper quadrant 11/29/2010    Past Medical History:  Diagnosis Date  . Anemia   . Anxiety   . Arthritis    osteoarthritis   . ASCUS (atypical squamous cells of undetermined significance) on Pap smear 05/06/05   NEG HIGH RISK HPV--C&B BIOPSY BENIGN 12/2005  . Asthma   . Bipolar 2 disorder ([place])   . Cancer ([place])    skin cancer - basal cell  . Colon polyps   . Complication of anesthesia    anxious afterwards, will get headaches   . Constipation   . Depression   . Fibromyalgia 10/2013  . GERD (gastroesophageal reflux disease)   . 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 Heart murmur    never had any problems  . Hemorrhoids   . High cholesterol   . 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   cytology negative  . IBS (irritable bowel syndrome)   . Insomnia   . Lymphocytic colitis   . Lymphoma ([place])   . 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 Migraines   . Osteoarthritis   . Osteopenia   . Peripheral neuropathy   . PTSD (post-traumatic stress disorder)     Family History  Problem Relation Age of Onset  . Diabetes Father   . Hypertension Father   . Hyperlipidemia Father   . Heart disease Maternal Grandfather   . Heart disease Paternal Grandmother   . Heart disease Paternal Grandfather   . Depression Son   . Depression Son   . Anxiety disorder Son   . Depression Daughter   . Anxiety disorder Daughter   . Asthma Daughter    Past Surgical History:  Procedure Laterality Date  . BONE MARROW BIOPSY Left 12/18/2013   Plasma cell dyscrasia 8% population of  plasma cells  . BREAST BIOPSY Right    benign stereo  . CESAREAN SECTION  [phone]  . CHOLECYSTECTOMY N/A 10/16/2012   Procedure: LAPAROSCOPIC CHOLECYSTECTOMY;  Surgeon: Joyice Faster. Cornett, MD;  [facility];  Service: General;  Laterality: N/A;  . COLONOSCOPY     numerous times  . DILATION AND CURETTAGE OF UTERUS    . ESOPHAGOGASTRODUODENOSCOPY    . [place]   x3  . IUD REMOVAL  02/2015   Mirena  . LAPAROSCOPIC LYSIS OF ADHESIONS N/A 10/16/2012   Procedure:  LAPAROSCOPIC LYSIS OF ADHESIONS;  Surgeon: Joyice Faster. Cornett, MD;  [facility];  Service: General;  Laterality: N/A;  . LAPAROSCOPY N/A 10/16/2012   Procedure: LAPAROSCOPY DIAGNOSTIC;  Surgeon: Joyice Faster. Cornett, MD;  [facility];  Service: General;  Laterality: N/A;  . PELVIC LAPAROSCOPY    . RADIOLOGY WITH ANESTHESIA N/A 12/16/2015   Procedure: MRI OF BRAIN WITH AND WITHOUT CONTRAST;  Surgeon: Medication Radiologist, MD;  Location: [place];  Service: Radiology;  Laterality: N/A;  . SHOULDER SURGERY  2007/2008  . SPINE SURGERY  2010   cervical   Social History   Social History Narrative  . Not on file   Immunization History  Administered Date(s) Administered  . Influenza-Unspecified 11/20/2013  . PFIZER(Purple Top)SARS-COV-2 Vaccination 05/15/2019, 06/09/2019, 02/11/2020     Objective: Vital Signs: LMP 12/28/2010    Physical Exam   Musculoskeletal Exam: ***  CDAI Exam: CDAI Score: -- Patient Global: --; Provider Global: -- Swollen: --; Tender: -- Joint Exam 06/29/2020   No joint exam has been documented for this visit   There is currently no information documented on the homunculus. Go to the Rheumatology activity and complete the homunculus joint exam.  Investigation: No additional findings.  Imaging: No results found.  Recent Labs: Lab Results  Component Value Date   WBC 5.9 08/01/2016   HGB 14.6 08/01/2016   PLT 254 08/01/2016   NA 142 08/01/2016   K 4.7 08/01/2016   CL 107 08/01/2016   CO2 23 08/01/2016   GLUCOSE 95 08/01/2016   BUN 14 08/01/2016   CREATININE 0.87 08/01/2016   BILITOT 0.7 08/01/2016   ALKPHOS 55 08/01/2016   AST 23 08/01/2016   ALT 26 08/01/2016   PROT 7.4 08/01/2016   ALBUMIN 4.8 08/01/2016   CALCIUM 10.0 08/01/2016   GFRAA 88 08/01/2016    Speciality Comments: No specialty comments available.  Procedures:  No procedures performed Allergies: Sulfa antibiotics, Gluten meal, Lactose intolerance (gi), Flagyl  [metronidazole hcl], and Morphine and related   Assessment / Plan:     Visit Diagnoses: No diagnosis found.  Orders: No orders of the defined types were placed in this encounter.  No orders of the defined types were placed in this encounter.   Face-to-face time spent with patient was *** minutes. Greater than 50% of time was spent in counseling and coordination of care.  Follow-Up Instructions: No follow-ups on file.   Earnestine Mealing, CMA  Note - This record has been created using Editor, commissioning.  Chart creation errors have been sought, but may not always  have been located. Such creation errors do not reflect on  the standard of medical care.

## 2020-06-18 ENCOUNTER — Telehealth: Payer: Self-pay | Admitting: Psychiatry

## 2020-06-18 ENCOUNTER — Other Ambulatory Visit: Payer: Self-pay

## 2020-06-18 DIAGNOSIS — F331 Major depressive disorder, recurrent, moderate: Secondary | ICD-10-CM

## 2020-06-18 DIAGNOSIS — F902 Attention-deficit hyperactivity disorder, combined type: Secondary | ICD-10-CM

## 2020-06-18 MED ORDER — METHYLPHENIDATE HCL 10 MG PO TABS
10.0000 mg | ORAL_TABLET | Freq: Every day | ORAL | 0 refills | Status: DC
Start: 2020-06-18 — End: 2020-08-19

## 2020-06-18 NOTE — Telephone Encounter (Signed)
Pt would like a refill on Methaphenidate. Please send to [facility] on [address].

## 2020-06-18 NOTE — Telephone Encounter (Signed)
Last filled 05/10/20 and appt on 06/23/20. Pended for review

## 2020-06-22 DIAGNOSIS — D2261 Melanocytic nevi of right upper limb, including shoulder: Secondary | ICD-10-CM | POA: Diagnosis not present

## 2020-06-22 DIAGNOSIS — D225 Melanocytic nevi of trunk: Secondary | ICD-10-CM | POA: Diagnosis not present

## 2020-06-22 DIAGNOSIS — Z85828 Personal history of other malignant neoplasm of skin: Secondary | ICD-10-CM | POA: Diagnosis not present

## 2020-06-22 DIAGNOSIS — L821 Other seborrheic keratosis: Secondary | ICD-10-CM | POA: Diagnosis not present

## 2020-06-22 DIAGNOSIS — D2272 Melanocytic nevi of left lower limb, including hip: Secondary | ICD-10-CM | POA: Diagnosis not present

## 2020-06-22 DIAGNOSIS — L570 Actinic keratosis: Secondary | ICD-10-CM | POA: Diagnosis not present

## 2020-06-22 DIAGNOSIS — D2271 Melanocytic nevi of right lower limb, including hip: Secondary | ICD-10-CM | POA: Diagnosis not present

## 2020-06-22 DIAGNOSIS — L814 Other melanin hyperpigmentation: Secondary | ICD-10-CM | POA: Diagnosis not present

## 2020-06-22 DIAGNOSIS — D1801 Hemangioma of skin and subcutaneous tissue: Secondary | ICD-10-CM | POA: Diagnosis not present

## 2020-06-22 DIAGNOSIS — L820 Inflamed seborrheic keratosis: Secondary | ICD-10-CM | POA: Diagnosis not present

## 2020-06-23 ENCOUNTER — Other Ambulatory Visit: Payer: Self-pay

## 2020-06-23 ENCOUNTER — Encounter: Payer: Self-pay | Admitting: Psychiatry

## 2020-06-23 ENCOUNTER — Ambulatory Visit: Payer: Medicare Other

## 2020-06-23 ENCOUNTER — Ambulatory Visit (INDEPENDENT_AMBULATORY_CARE_PROVIDER_SITE_OTHER): Payer: Medicare Other | Admitting: Psychiatry

## 2020-06-23 VITALS — BP 111/53 | HR 72

## 2020-06-23 DIAGNOSIS — F331 Major depressive disorder, recurrent, moderate: Secondary | ICD-10-CM

## 2020-06-23 DIAGNOSIS — M6281 Muscle weakness (generalized): Secondary | ICD-10-CM

## 2020-06-23 DIAGNOSIS — F4310 Post-traumatic stress disorder, unspecified: Secondary | ICD-10-CM | POA: Diagnosis not present

## 2020-06-23 DIAGNOSIS — R293 Abnormal posture: Secondary | ICD-10-CM | POA: Diagnosis not present

## 2020-06-23 DIAGNOSIS — M353 Polymyalgia rheumatica: Secondary | ICD-10-CM | POA: Diagnosis not present

## 2020-06-23 DIAGNOSIS — F902 Attention-deficit hyperactivity disorder, combined type: Secondary | ICD-10-CM | POA: Diagnosis not present

## 2020-06-23 DIAGNOSIS — F422 Mixed obsessional thoughts and acts: Secondary | ICD-10-CM

## 2020-06-23 DIAGNOSIS — M79671 Pain in right foot: Secondary | ICD-10-CM | POA: Diagnosis not present

## 2020-06-23 DIAGNOSIS — F411 Generalized anxiety disorder: Secondary | ICD-10-CM

## 2020-06-23 DIAGNOSIS — R252 Cramp and spasm: Secondary | ICD-10-CM

## 2020-06-23 DIAGNOSIS — F4001 Agoraphobia with panic disorder: Secondary | ICD-10-CM

## 2020-06-23 DIAGNOSIS — M79672 Pain in left foot: Secondary | ICD-10-CM | POA: Diagnosis not present

## 2020-06-23 NOTE — Therapy (Signed)
[facility] [address], [place], [place], [address] Phone: [phone]   Fax:  [phone]  Physical Therapy Treatment  Patient Details  Name: Danielle Bruce MRN: [id number] Date of Birth: 1960-12-04 Referring Provider (PT): Mayra Neer, MD   Encounter Date: 06/23/2020   PT End of Session - 06/23/20 1616    Visit Number 18    Date for PT Re-Evaluation 08/10/20    Authorization Type Medicare/Medicaid- KX needed    PT Start Time 1532    PT Stop Time 1614    PT Time Calculation (min) 42 min    Activity Tolerance Patient tolerated treatment well    Behavior During Therapy [facility] for tasks assessed/performed           Past Medical History:  Diagnosis Date  . Anemia   . Anxiety   . Arthritis    osteoarthritis  . ASCUS (atypical squamous cells of undetermined significance) on Pap smear 05/06/05   NEG HIGH RISK HPV--C&B BIOPSY BENIGN 12/2005  . Asthma   . Bipolar 2 disorder ([place])   . Cancer ([place])    skin cancer - basal cell  . Colon polyps   . Complication of anesthesia    anxious afterwards, will get headaches   . Constipation   . Depression   . Fibromyalgia 10/2013  . GERD (gastroesophageal reflux disease)   . 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 Heart murmur    never had any problems  . Hemorrhoids   . High cholesterol   . 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   cytology negative  . IBS (irritable bowel syndrome)   . Insomnia   . Lymphocytic colitis   . Lymphoma ([place])   . 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 Migraines   . Osteoarthritis   . Osteopenia   . Peripheral neuropathy   . PTSD (post-traumatic stress disorder)     Past Surgical History:  Procedure Laterality Date  . BONE MARROW BIOPSY Left 12/18/2013   Plasma cell dyscrasia 8% population of plasma cells  . BREAST BIOPSY Right    benign stereo  . CESAREAN  SECTION  [phone]  . CHOLECYSTECTOMY N/A 10/16/2012   Procedure: LAPAROSCOPIC CHOLECYSTECTOMY;  Surgeon: Joyice Faster. Cornett, MD;  [facility];  Service: General;  Laterality: N/A;  . COLONOSCOPY     numerous times  . DILATION AND CURETTAGE OF UTERUS    . ESOPHAGOGASTRODUODENOSCOPY    . [place]   x3  . IUD REMOVAL  02/2015   Mirena  . LAPAROSCOPIC LYSIS OF ADHESIONS N/A 10/16/2012   Procedure: LAPAROSCOPIC LYSIS OF ADHESIONS;  Surgeon: Joyice Faster. Cornett, MD;  [facility];  Service: General;  Laterality: N/A;  . LAPAROSCOPY N/A 10/16/2012   Procedure: LAPAROSCOPY DIAGNOSTIC;  Surgeon: Joyice Faster. Cornett, MD;  [facility];  Service: General;  Laterality: N/A;  . PELVIC LAPAROSCOPY    . RADIOLOGY WITH ANESTHESIA N/A 12/16/2015   Procedure: MRI OF BRAIN WITH AND WITHOUT CONTRAST;  Surgeon: Medication Radiologist, MD;  Location: [place];  Service: Radiology;  Laterality: N/A;  . SHOULDER SURGERY  2007/2008  . SPINE SURGERY  2010   cervical    There were no vitals filed for this visit.   Subjective Assessment - 06/23/20 1534    Subjective I got a new pair of shoes and they are good for my feet.  The needling helped me last time.    Currently in Pain? Yes    Pain Score 3    up to 6-7/10   Pain Location Thoracic    Pain Orientation Left    Pain Descriptors / Indicators Aching;Tightness    Pain Type Acute pain;Chronic pain    Pain Onset More than a month ago    Pain Frequency Intermittent    Aggravating Factors  walking, lifting/carrying    Pain Relieving Factors TENs, Voltaren, pain patches                             [facility] Adult PT Treatment/Exercise - 06/23/20 0001      Iontophoresis   Type of Iontophoresis Dexamethasone    Location Lt trochanteric bursa    Dose 1.0 cc   #3   Time 6 hour patch      Manual Therapy   Manual Therapy Soft tissue mobilization;Myofascial release    Myofascial Release bil upper traps, subscapularis, Lt  gluteals- elongation and release            Trigger Point Dry Needling - 06/23/20 0001    Consent Given? Yes    Muscles Treated Head and Neck Upper trapezius   Lt only   Muscles Treated Upper Quadrant Subscapularis   Lt   Muscles Treated Back/Hip Gluteus minimus;Gluteus medius   Lt only   Upper Trapezius Response Twitch reponse elicited;Palpable increased muscle length    Gluteus Minimus Response Twitch response elicited;Palpable increased muscle length    Gluteus Medius Response Twitch response elicited;Palpable increased muscle length                  PT Short Term Goals - 03/29/20 1030      PT SHORT TERM GOAL #1   Title be independent in initial HEP    Status Achieved             PT Long Term Goals - 06/15/20 1536      PT LONG TERM GOAL #1   Title be independent in advanced HEP    Time 8    Period Weeks    Status On-going    Target Date 08/10/20      PT LONG TERM GOAL #2   Title Pt will be able to walk at least 1 mile without increased pain so she can return to birding    Baseline I can walk 1 mile    Status Achieved      PT LONG TERM GOAL #3   Title report 75% less neck/UE pain throughout the day with normal functional activities    Baseline not as painful and can do more stuff    Status Partially Met      PT LONG TERM GOAL #4   Title Pt will be able to hold her grandchildren for at least 1 hour throughout the day in order to help her daughter with child care    Baseline I can hold them for an hour throughout the day    Status Achieved      PT LONG TERM GOAL #5   Title Pt will demonstrate at least 4+/5 wrist and hip strength for improved stability for walking and lifting    Baseline 4+/5 wrist; 4/5 hip strength on Lt side    Status Partially Met                 Plan - 06/23/20 1615  Clinical Impression Statement Pt reports Lt scapular and Lt gluteal pain today.   Session focused on dry needling and mobilization to these regions with  good response with multiple twitch responses.  Pt with improved tissue mobility after manual therapy and dry needling today.  Pt purchased a new pair of shoes for walking and is doing well with these.  Pt is responding well to ionto and manual therapy and will continue to benefit from skilled PT to address chronic pain and improve function.    PT Frequency 1x / week    PT Duration 8 weeks    PT Treatment/Interventions ADLs/Self Care Home Management;Cryotherapy;Electrical Stimulation;Iontophoresis 34m/ml Dexamethasone;Moist Heat;Ultrasound;Neuromuscular re-education;Therapeutic exercise;Therapeutic activities;Patient/family education;Manual techniques;Dry needling;Passive range of motion;Taping    PT Next Visit Plan DN again if pt tolerated well.  Manual, strength and flexiblity.  Ionto #4    PT Home Exercise Plan Access Code: [email or web address]    Consulted and Agree with Plan of Care Patient           Patient will benefit from skilled therapeutic intervention in order to improve the following deficits and impairments:  Abnormal gait,Increased fascial restricitons,Difficulty walking,Decreased range of motion,Pain,Decreased strength,Postural dysfunction  Visit Diagnosis: Muscle weakness (generalized)  Polymyalgia rheumatica (HCC)  Cramp and spasm     Problem List Patient Active Problem List   Diagnosis Date Noted  . GAD (generalized anxiety disorder) 12/26/2017  . OCD (obsessive compulsive disorder) 12/26/2017  . PTSD (post-traumatic stress disorder) 12/26/2017  . DDD (degenerative disc disease), cervical 07/14/2016  . Primary osteoarthritis of both feet 07/14/2016  . Primary osteoarthritis of both hands 07/14/2016  . Other fatigue 07/14/2016  . History of IBS 07/14/2016  . Osteopenia of multiple sites 07/14/2016  . Fibromyalgia 01/13/2016  . MGUS (monoclonal gammopathy of unknown significance) 12/23/2013  . Chronic cholecystitis without calculus 10/18/2012  . Abdominal pain, unspecified  site 10/18/2012  . Nausea alone 10/18/2012  . Unspecified constipation 10/18/2012  . Depression 10/18/2012  . Anxiety   . ASCUS (atypical squamous cells of undetermined significance) on Pap smear   . IUD   . Hemorrhoids 11/29/2010  . Abdominal pain, left upper quadrant 11/29/2010     KSigurd Sos PT 06/23/20 4:17 PM  [facility] [address] [place] [address]Phone: [phone]  Fax:  [phone] Name: Danielle WHITENMRN: [id number] of Birth: 9Aug 05, 1962

## 2020-06-23 NOTE — Progress Notes (Signed)
Danielle Bruce [id number] 1960-09-30 61 y.o.   Subjective:   Patient ID:  Danielle Bruce is a 60 y.o. (DOB 11/15/1960) female.  Chief Complaint:  Chief Complaint  Patient presents with  . Follow-up  . Major depressive disorder, recurrent episode, moderate (HCC)    HPI Danielle Bruce presents for follow-up of multiple dxes.  visit in April and taken off lamotrigine DT possible skin reaction.  Had appt with [facility] dermatology and they assume that skin reaction with itching was related to lamotrigine. 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 Per oncology most likely drug is lamotrigine. And she weaned off of the medication to determine if the cutaneous lesions are related. Lesions seem less without lamotrigine.  At this point skin reaction is presumed related to lamotrigine.  No other disease sx. She is well aware that she has been on lamotrigine for many years with good results for depression control. There is a risk of relapse as she comes off of the medication and she is worried about that. Smart phone helps her cognitive  And other productivity.    seen October 2020.  Restarted low dosage ADD bc concerns over STM, concentration and productivity.  Ritalin 5 mg twice daily.  Also suggested she retry N-acetylcysteine for mild cognitive complaints.  Last seen April 07, 2019 the following was noted: Mostly good.  Anxiety is mostly better, but depression is a little worse seasonally.  Not able to go help her daughter.   Low energy but does have motivation.  Ritalin helps energy and focus but doesn't take it regularly bc irregular sleep cycle.   Ativan only when having medical issues.    Therapy working on trauma issues is very hard.  Struggling with the fact she doesn't have memory of large parts of her past.   No manic sx off mood stabilizers.  Covid isolation  reduce stressors overall and not prominently depressed.   No meds were changed.  June 25, 2019 appointment the following is noted: F real worried about Covid despite vaccination.   Started fluvoxamine 25 on May 14, 2019 from PCP mainly for inflammation but possibly thinking she might be depressed. Seen benefit for energy and sleep pattern and more appropriate feelings and less numb.  Now realizes she had been depressed for awhile but just numb with depression.  Anniversaries of deaths of friends including last BF Danielle Bruce a little over a year ago.   Thinks of him daily. His 58 yo D died of unknown cause recently.  Stress D moved to beach. Wonders about increasing the fluvoxamine  Danielle Bruce living in [place] and hasn't seen him in a year. Danielle Bruce on disability for depression. Patient reports more depression.  Plan: Realized lately she's depressed and started Luvox again for it and for the anti-inflammatory potential for her health.  Has seen some benefit.   Prior benefit at 25 mg daily.  Optiion increase but slightly bc med sensitive. AGREE increase fluvoxamine to 37.5 mg daily.  08/06/2019 appointment with the following noted: Increased fluvoxamine to 37.5 mg and it helped the depression. Could see a big difference and had been more depressed than she even realized. Outlook better and sleep pattern normalized.    More energy and motivation.   But also started having prolonged HA.  HA lasted a couple of weeks.   Assumed HA was DT increased fluvoxamine so reduced to 25 mg on  07/28/19.  07/29/19 Episode of confusion occurred also.  Went to doctor while confused.  They couldn't determine cause.    Resolved in 1 day. FM flare up since fall/winter. To beach with daughter 3 mos minimum to give her shots for IVF treatment. Patient reports panic recently and recent difficulty with anxiety.    Occ flashbacks.   Patient denies difficulty with sleep initiation or maintenance. Denies appetite disturbance.  Patient  reports that energy and motivation have been good.  Patient denies any difficulty with concentration.  Patient denies any suicidal ideation. Plan: AGREE increase fluvoxamine to 37.5 mg daily once HA is controlled for several weekcs.  01/01/20 appt with the following noted: Increased fluvoxamine for awhile but got more HA and reduced it. Anxiety is better in general now.  But is interested in increasing it DT coming winter. When anxiety is not good it catches her off guard. Likely had Covid in August but early test was negative.  Got prednisone but sick for 5 weeks.  Lost taste and smell still going on.  D also had Covid.  Felt really sick and terrified with bad mental health at the time.  Was traumatic for her.  Had been vaccinated.   Had panic attack at the ER and took a long time to get the Ativan.  D moving back to area and pt will have to help with childcare so hopes to more aggressively treat her health problems. 2 gkids and 2 more coming. Danielle Bruce married without kids yet. Plan: started Luvox again for it and for the anti-inflammatory potential for her health.  Has seen some benefit.   Prior benefit at 25 mg daily.  Optiion increase but slightly bc med sensitive. AGREE increase fluvoxamine to 37.5 mg daily once HA is controlled for several weeks.  03/03/2020 appointment with the following noted: On Day 10 of Covid.  2nd bout. A lot easier this time. Had gotten Luvox up to 50 mg daily and tolerated.  Then doubled it to 100 mg daily. Wonders about the proper dose for maintenance.   No taste and smell, exhausted, congestion and mild cough.  Energy is better than it was in the beginning.  Made her depressed and anxious again and that part is better also.  Last time had to go to ER with Covid.  Only ER visit in 2-3 years.  Less migraine than in the past. Overall was doing better with last couple months.  Trying to do anti-inflammatory diet.  Vegetarian, no dairy or gluten.  Natural sugars.   Still  some anxiety. D moved back here and pregnant with 2nd set of twins. Current twins are 58 yo.   Will restart trauma therapy and it's helped over the last couple of years.  More freedom from things.      Less daily dread with anxiety but still some panic which can be triggered.  Less OCD with fluvoxamine unless triggered.   Taking Ritalin 10 mg each AM and tolerating and benefiting. occ NM with few dreams overall. Plan: started Luvox again for it and for the anti-inflammatory potential for her health.  Has seen some benefit.   Prior benefit at 25 mg daily.  Med sensitive. AGREE increase fluvoxamine to  50  mg daily. Up to 100 mg daily if desired.   04/22/19 appt with following noted: Mostly good and randomly psycho.  Really busy with Colletta [place] [redacted] weeks pregnant with twins and twins 58 yo at home.  She's helping and her H is  away. Mo older and slower.  Pt overworking.  When with Colletta [place] kids interfere with sleep.  Doesn't eat as well with Colletta [place] affects her FM and energy.   Recently exhausted and needed rest but couldn't go home DT D had to go to hospital.  Her H critical at times and she lost it emotionally over this. Almost suicidal reaction with weird intrusive thoughts in response to the criticism.   Got a break and it helped.   Rheum adjusted Lyrica to BID. Had accident fall with one of babies and was bleeding.  She became hysterical.   Upset now that she couldn't control her emotions at the time.  Couldn't get herself under control to deal with the crisis.    Overall doing ok but doesn't handle stressors well and lose it.   Plan: AGREE with increase fluvoxamine to  50  mg daily. Up to 100 mg daily if desired.  06/23/2020 appointment with the following noted: Increased fluvoxamine 25 AM and 50 mg PM and tolerated it.  Been on this dose awhile.  Going to [place] May 6-19 and son taking her.  Working for [employer] and been successful. Also going to [place], [place].  Excited.   Occ  overwhelmed. Starting therapy with [facility] and looking forward to it and dealing with trauma stuff. Nervous about doing the therapy and getting decompensated like what happened last time. Prednisone hellps mood and body. Not as depressed as in the past.  Sleep is reasonably good.  Appetite is normal.  Anxiety intermittent.  No suicidal thoughts  Past psych meds: Lithium, carbamazepine, oxcarbazepine, topiramate, valproic acid, lamotrigine, gabapentin  Zyprexa, Seroquel, Latuda headaches, Geodon,  Risperidone, Stelazine,  Rexulti,  Wellbutrin with side effects,  sertraline irritability, duloxetine, fluvoxamine 100 with Covid, fluoxetine, Stopped desipramine DT skin issues which now resolved.  It helped GI px.  buspirone side effects,   N-acetylcysteine, pramipexole,  methylphenidate,  Adderall,   Ambien with amnesia, trazodone, Ativan  Review of Systems:  Review of Systems  Constitutional: Positive for fatigue.  Cardiovascular: Negative for chest pain.  Gastrointestinal: Positive for abdominal pain. Negative for abdominal distention, nausea and vomiting.  Musculoskeletal: Positive for myalgias.  Neurological: Positive for dizziness and headaches. Negative for tremors and weakness.  Psychiatric/Behavioral: Positive for dysphoric mood. Negative for agitation, behavioral problems, confusion, hallucinations, self-injury and suicidal ideas. The patient is nervous/anxious.   Less HA with new meds.  Medications: I have reviewed the patient's current medications.  Current Outpatient Medications  Medication Sig Dispense Refill  . albuterol (ACCUNEB) 0.63 MG/3ML nebulizer solution Take 1 ampule by nebulization every 6 (six) hours as needed for wheezing.    Marland Kitchen albuterol (PROVENTIL HFA;VENTOLIN HFA) 108 (90 Base) MCG/ACT inhaler Inhale 2 puffs into the lungs every 6 (six) hours as needed for wheezing or shortness of breath.    . Ascorbic Acid (VITAMIN C) 100 MG tablet Take 100 mg by mouth daily.    Marland Kitchen  azelastine (ASTELIN) 0.1 % nasal spray Place into both nostrils 2 (two) times daily. Use in each nostril as directed    . Azelastine HCl 0.15 % SOLN Place 2 sprays into both nostrils daily.    Marland Kitchen BEPREVE 1.5 % SOLN Place 1 drop into both eyes daily.     . Coenzyme Q10 (CO Q 10 PO) Take by mouth daily.    . cyclobenzaprine (FLEXERIL) 10 MG tablet Take 10 mg by mouth at bedtime as needed.    . diphenhydrAMINE (BENADRYL) 25 mg capsule Take 50 mg  by mouth every 6 (six) hours as needed (HEADACHES).    Marland Kitchen EPINEPHrine 0.3 mg/0.3 mL IJ SOAJ injection as needed.    Eduard Roux 70 MG/ML SOAJ Inject into the skin every 28 (twenty-eight) days.    . fluorouracil (EFUDEX) 5 % cream as needed.    . fluticasone (FLONASE) 50 MCG/ACT nasal spray Place 2 sprays into both nostrils daily. 16 g 0  . L-methylfolate Calcium 15 MG TABS TAKE 1 TABLET BY MOUTH DAILY 30 tablet 11  . levocetirizine (XYZAL) 5 MG tablet Take 5 mg by mouth every evening.  5  . LORazepam (ATIVAN) 1 MG tablet Take 1 tablet (1 mg total) by mouth every 8 (eight) hours as needed for anxiety. (takes 2 mg when having a medical procedure.) 30 tablet 1  . LORazepam (ATIVAN) 2 MG/ML concentrated solution Take 0.5 mLs (1 mg total) by mouth every 8 (eight) hours as needed for anxiety (severe panic). 30 mL 1  . methocarbamol (ROBAXIN) 500 MG tablet Take 1 tablet (500 mg total) by mouth daily as needed for muscle spasms. 30 tablet 1  . methylphenidate (RITALIN) 10 MG tablet Take 1 tablet (10 mg total) by mouth daily. 30 tablet 0  . montelukast (SINGULAIR) 10 MG tablet Take 10 mg by mouth at bedtime.    Marland Kitchen oxyCODONE-acetaminophen (PERCOCET/ROXICET) 5-325 MG per tablet Take 2 tablets by mouth daily as needed for moderate pain or severe pain (mighraine.).     Marland Kitchen polyethylene glycol (MIRALAX / GLYCOLAX) packet Take 17 g by mouth daily as needed for mild constipation.    Marland Kitchen PRAVASTATIN SODIUM PO Take 40 mg by mouth.     . pregabalin (LYRICA) 50 MG capsule Take 100  mg by mouth at bedtime. 1 tab in am and 1 tab HS.    Marland Kitchen PROLIA 60 MG/ML SOSY injection BRING TO THE OFFICE FOR INJECTION ON 02/15/2018 AS DIRECTED ONCE EVERY 6 MONTHS    . promethazine (PHENERGAN) 25 MG tablet Take 25 mg by mouth every 6 (six) hours as needed for nausea.    . Rimegepant Sulfate (NURTEC PO) Take by mouth as needed.    . traZODone (DESYREL) 50 MG tablet TAKE 1 TABLET(50 MG) BY MOUTH AT BEDTIME 90 tablet 0  . TURMERIC PO Take by mouth daily.    Marland Kitchen VASCEPA 1 g CAPS TK 2 CS PO BID WC  3  . VITAMIN D PO Take by mouth. Take 5047m daily    . VOLTAREN 1 % GEL Apply 2 g topically 4 (four) times daily as needed (JOINT PAIN).   3  . Butalbital-APAP-Caffeine 50-300-40 MG CAPS butalbital-acetaminophen-caffeine 50 mg-300 mg-40 mg capsule  TAKE 1 CAPSULE BY MOUTH EVERY 4 HOURS AS NEEDED FOR MIGRAINE (Patient not taking: No sig reported)    . dexlansoprazole (DEXILANT) 60 MG capsule Dexilant 60 mg capsule, delayed release  TAKE 1 CAPSULE BY MOUTH EVERY DAY (Patient not taking: No sig reported)    . fluvoxaMINE (LUVOX) 25 MG tablet Take 1 tablet (25 mg total) by mouth 2 (two) times daily. 180 tablet 0  . MELATONIN PO Take 1.5 mg by mouth at bedtime as needed. (Patient not taking: No sig reported)     No current facility-administered medications for this visit.    Medication Side Effects: None  Allergies:  Allergies  Allergen Reactions  . Sulfa Antibiotics Nausea And Vomiting    Causes pt to vomit blood  . Gluten Meal Other (See Comments)    Sensitivity.   . Lactose  Intolerance (Gi) Nausea And Vomiting  . Flagyl [Metronidazole Hcl] Nausea And Vomiting  . Morphine And Related Other (See Comments)    Reaction: Depression, emotional    Past Medical History:  Diagnosis Date  . Anemia   . Anxiety   . Arthritis    osteoarthritis  . ASCUS (atypical squamous cells of undetermined significance) on Pap smear 05/06/05   NEG HIGH RISK HPV--C&B BIOPSY BENIGN 12/2005  . Asthma   . Bipolar 2  disorder ([place])   . Cancer ([place])    skin cancer - basal cell  . Colon polyps   . Complication of anesthesia    anxious afterwards, will get headaches   . Constipation   . Depression   . Fibromyalgia 10/2013  . GERD (gastroesophageal reflux disease)   . 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 Heart murmur    never had any problems  . Hemorrhoids   . High cholesterol   . 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   cytology negative  . IBS (irritable bowel syndrome)   . Insomnia   . Lymphocytic colitis   . Lymphoma ([place])   . 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 Migraines   . Osteoarthritis   . Osteopenia   . Peripheral neuropathy   . PTSD (post-traumatic stress disorder)     Family History  Problem Relation Age of Onset  . Diabetes Father   . Hypertension Father   . Hyperlipidemia Father   . Heart disease Maternal Grandfather   . Heart disease Paternal Grandmother   . Heart disease Paternal Grandfather   . Depression Son   . Depression Son   . Anxiety disorder Son   . Depression Daughter   . Anxiety disorder Daughter   . Asthma Daughter     Social History   Socioeconomic History  . Marital status: Divorced    Spouse name: Not on file  . Number of children: 3  . Years of education: Not on file  . Highest education level: Not on file  Occupational History  . Not on file  Tobacco Use  . Smoking status: Former Smoker    Types: Cigarettes    Quit date: 05/13/2014    Years since quitting: 6.1  . Smokeless tobacco: Never Used  . Tobacco comment: social   Vaping Use  . Vaping Use: Never used  Substance and Sexual Activity  . Alcohol use: Yes    Alcohol/week: 0.0 standard drinks    Comment: rare  . Drug use: Never  . Sexual activity: Not Currently    Comment: -1st intercourse 60 yo-More than 5 partners  Other Topics Concern  . Not on file  Social History Narrative  . Not on file   Social  Determinants of Health   Financial Resource Strain: Not on file  Food Insecurity: Not on file  Transportation Needs: Not on file  Physical Activity: Not on file  Stress: Not on file  Social Connections: Not on file  Intimate Partner Violence: Not on file    Past Medical History, Surgical history, Social history, and Family history were reviewed and updated as appropriate.   Please see review of systems for further details on the patient's review from today.   Objective:   Physical Exam:  BP (!) 111/53   Pulse 72   LMP 12/28/2010   Physical Exam Constitutional:      General: She is not in  acute distress. Musculoskeletal:        General: No deformity.  Neurological:     Mental Status: She is alert and oriented to person, place, and time.     Cranial Nerves: No dysarthria.     Coordination: Coordination normal.  Psychiatric:        Attention and Perception: Attention and perception normal. She does not perceive auditory or visual hallucinations.        Mood and Affect: Mood is anxious. Mood is not depressed. Affect is not labile, blunt, angry, tearful or inappropriate.        Speech: Speech normal.        Behavior: Behavior normal. Behavior is cooperative.        Thought Content: Thought content normal. Thought content is not paranoid or delusional. Thought content does not include homicidal or suicidal ideation. Thought content does not include homicidal or suicidal plan.        Cognition and Memory: Cognition and memory normal.        Judgment: Judgment normal.     Comments: Insight intact Depression resolved with fluvoxamine 50 routinely. Mood worsen when health problems worsen.   Recent trauma led to guilt feelings.     Lab Review:     Component Value Date/Time   NA 142 08/01/2016 1713   NA 144 07/12/2015 1521   K 4.7 08/01/2016 1713   K 3.8 07/12/2015 1521   CL 107 08/01/2016 1713   CO2 23 08/01/2016 1713   CO2 27 07/12/2015 1521   GLUCOSE 95 08/01/2016 1713    GLUCOSE 82 07/12/2015 1521   BUN 14 08/01/2016 1713   BUN 10.3 07/12/2015 1521   CREATININE 0.87 08/01/2016 1713   CREATININE 0.8 07/12/2015 1521   CALCIUM 10.0 08/01/2016 1713   CALCIUM 9.9 07/12/2015 1521   PROT 7.4 08/01/2016 1713   PROT 7.4 07/12/2015 1521   ALBUMIN 4.8 08/01/2016 1713   ALBUMIN 4.4 07/12/2015 1521   AST 23 08/01/2016 1713   AST 22 07/12/2015 1521   ALT 26 08/01/2016 1713   ALT 31 07/12/2015 1521   ALKPHOS 55 08/01/2016 1713   ALKPHOS 54 07/12/2015 1521   BILITOT 0.7 08/01/2016 1713   BILITOT 0.40 07/12/2015 1521   GFRNONAA 76 08/01/2016 1713   GFRAA 88 08/01/2016 1713       Component Value Date/Time   WBC 5.9 08/01/2016 1713   RBC 4.71 08/01/2016 1713   HGB 14.6 08/01/2016 1713   HGB 14.1 07/12/2015 1521   HCT 43.1 08/01/2016 1713   HCT 41.5 07/12/2015 1521   PLT 254 08/01/2016 1713   PLT 248 07/12/2015 1521   MCV 91.5 08/01/2016 1713   MCV 92.2 07/12/2015 1521   MCH 31.0 08/01/2016 1713   MCHC 33.9 08/01/2016 1713   RDW 13.2 08/01/2016 1713   RDW 13.3 07/12/2015 1521   LYMPHSABS 2,006 08/01/2016 1713   LYMPHSABS 1.8 07/12/2015 1521   MONOABS 354 08/01/2016 1713   MONOABS 0.7 07/12/2015 1521   EOSABS 118 08/01/2016 1713   EOSABS 0.1 07/12/2015 1521   BASOSABS 0 08/01/2016 1713   BASOSABS 0.0 07/12/2015 1521    No results found for: POCLITH, LITHIUM   No results found for: PHENYTOIN, PHENOBARB, VALPROATE, CBMZ   .res Assessment: Plan:    Ghina was seen today for follow-up and major depressive disorder, recurrent episode, moderate (hcc).  Diagnoses and all orders for this visit:  Major depressive disorder, recurrent episode, moderate (HCC)  Attention deficit hyperactivity disorder (ADHD), combined  type  PTSD (post-traumatic stress disorder)  Generalized anxiety disorder  Panic disorder with agoraphobia  Mixed obsessional thoughts and acts  Last year more obvious PTSD obvious has called into doubt the bipolar dix bc she's  remained mood stable off the lamotrigine.  Need more time to reevaluate this and disc risk mania and mood cycling.  Discussed continued risk of hypomanic symptoms.  She has had symptoms consistent with hypomania in the past but there is now some question as to whether they were PTSD related rather than truly bipolar 2.  Her son has had treatment resistant depression with possible bipolar disorder and her daughter has been diagnosed with bipolar disorder but also a history of borderline personality disorder.  So there is a family history of the spectrum.  started Luvox again for it and for the anti-inflammatory potential for her health.  Has seen some benefit.   Prior benefit at 25 mg daily.  Med sensitive. Taking fluvoxamine 25 mg twice daily Clear benefit from fluvoxamine for mood quite dramatically. No manic sx. Disc anti-inflammatory effects of fluvoxamine at doses of 100 mg BID for Covid.  2 studies proving benefit so far. HA are better.   Stress management for mood sx. Health problems worsen mood episodically.   Processed trauma of accident with 60yo [place].  She wants to try further trauma work with new therapist in hopes of being less triggered.  Overall doing well but when triggered doesn't handle it well.  We discussed the short-term risks associated with benzodiazepines including sedation and increased fall risk among others.  Discussed long-term side effect risk including dependence, potential withdrawal symptoms, and the potential eventual dose-related risk of dementia.  Keep this at a minimum given cognitive concerns.  She only uses it when triggered with anxiety usually medical.  She is not using it regularly.  Discussed potential benefits, risks, and side effects of stimulants with patient to include increased heart rate, palpitations, insomnia, increased anxiety, increased irritability, or decreased appetite.  Instructed patient to contact office if experiencing any significant tolerability  issues. Benefit so continue Ritalin 5 BID or 10 mg AM for cognitive and ADD reasons and disc risk of palpitations.   Continue therapy.  She is working well on things. Plans more therapy soon.  Greater than 50% of 30 mins of webex face to face time with patient was spent on counseling and coordination of care.   FU 3 mos  Lynder Parents, MD, DFAPA    Please see After Visit Summary for patient specific instructions.  Future Appointments  Date Time Provider [place]  07/19/2020 10:15 AM Danie Binder, PT [facility]  07/22/2020 10:40 AM Ofilia Neas, PA-C [facility] None  07/28/2020 10:00 AM [name], LCSW [facility] None  07/28/2020 11:45 AM Danie Binder, PT [facility]  08/04/2020  8:30 AM Princess Bruins, MD [facility] None  08/04/2020 10:15 AM Danie Binder, PT [facility]  08/19/2020 10:00 AM Cottle, Billey [place]., MD [facility] None  08/24/2020 10:00 AM [name], LCSW [facility] None  09/02/2020  8:00 AM [name], LCSW [facility] None  09/15/2020 11:00 AM [name], LCSW [facility] None  09/22/2020  1:00 PM [name], LCSW [facility] None    No orders of the defined types were placed in this encounter.     -------------------------------

## 2020-06-28 ENCOUNTER — Other Ambulatory Visit: Payer: Self-pay | Admitting: Psychiatry

## 2020-06-28 DIAGNOSIS — F4001 Agoraphobia with panic disorder: Secondary | ICD-10-CM

## 2020-06-28 DIAGNOSIS — F4310 Post-traumatic stress disorder, unspecified: Secondary | ICD-10-CM

## 2020-06-28 DIAGNOSIS — F411 Generalized anxiety disorder: Secondary | ICD-10-CM

## 2020-06-29 ENCOUNTER — Ambulatory Visit: Payer: Medicare Other | Admitting: Rheumatology

## 2020-06-29 DIAGNOSIS — D472 Monoclonal gammopathy: Secondary | ICD-10-CM

## 2020-06-29 DIAGNOSIS — K811 Chronic cholecystitis: Secondary | ICD-10-CM

## 2020-06-29 DIAGNOSIS — M19071 Primary osteoarthritis, right ankle and foot: Secondary | ICD-10-CM

## 2020-06-29 DIAGNOSIS — M7062 Trochanteric bursitis, left hip: Secondary | ICD-10-CM

## 2020-06-29 DIAGNOSIS — G4709 Other insomnia: Secondary | ICD-10-CM

## 2020-06-29 DIAGNOSIS — M797 Fibromyalgia: Secondary | ICD-10-CM

## 2020-06-29 DIAGNOSIS — Z8659 Personal history of other mental and behavioral disorders: Secondary | ICD-10-CM

## 2020-06-29 DIAGNOSIS — M5030 Other cervical disc degeneration, unspecified cervical region: Secondary | ICD-10-CM

## 2020-06-29 DIAGNOSIS — R202 Paresthesia of skin: Secondary | ICD-10-CM

## 2020-06-29 DIAGNOSIS — M8589 Other specified disorders of bone density and structure, multiple sites: Secondary | ICD-10-CM

## 2020-06-29 DIAGNOSIS — M62838 Other muscle spasm: Secondary | ICD-10-CM

## 2020-06-29 DIAGNOSIS — M19042 Primary osteoarthritis, left hand: Secondary | ICD-10-CM

## 2020-06-29 DIAGNOSIS — K52832 Lymphocytic colitis: Secondary | ICD-10-CM

## 2020-06-29 DIAGNOSIS — Z8719 Personal history of other diseases of the digestive system: Secondary | ICD-10-CM

## 2020-06-29 DIAGNOSIS — R5383 Other fatigue: Secondary | ICD-10-CM

## 2020-06-30 ENCOUNTER — Other Ambulatory Visit: Payer: Self-pay

## 2020-06-30 ENCOUNTER — Ambulatory Visit: Payer: Medicare Other | Attending: Family Medicine

## 2020-06-30 ENCOUNTER — Ambulatory Visit (INDEPENDENT_AMBULATORY_CARE_PROVIDER_SITE_OTHER): Payer: Medicare Other | Admitting: Addiction (Substance Use Disorder)

## 2020-06-30 DIAGNOSIS — M6281 Muscle weakness (generalized): Secondary | ICD-10-CM

## 2020-06-30 DIAGNOSIS — M353 Polymyalgia rheumatica: Secondary | ICD-10-CM | POA: Insufficient documentation

## 2020-06-30 DIAGNOSIS — F411 Generalized anxiety disorder: Secondary | ICD-10-CM

## 2020-06-30 DIAGNOSIS — F4310 Post-traumatic stress disorder, unspecified: Secondary | ICD-10-CM

## 2020-06-30 DIAGNOSIS — R293 Abnormal posture: Secondary | ICD-10-CM | POA: Insufficient documentation

## 2020-06-30 DIAGNOSIS — R252 Cramp and spasm: Secondary | ICD-10-CM | POA: Diagnosis not present
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
## 2020-06-30 NOTE — Therapy (Signed)
[facility] [address], [place], [place], [address] Phone: [phone]   Fax:  [phone]  Physical Therapy Treatment  Patient Details  Name: Danielle Bruce MRN: [id number] Date of Birth: 12-Oct-1960 Referring Provider (PT): Mayra Neer, MD   Encounter Date: 06/30/2020   PT End of Session - 06/30/20 1445    Visit Number 19    Date for PT Re-Evaluation 08/10/20    Authorization Type Medicare/Medicaid- KX needed    PT Start Time 1401   DN   PT Stop Time 1440    PT Time Calculation (min) 39 min    Activity Tolerance Patient tolerated treatment well    Behavior During Therapy [facility] for tasks assessed/performed           Past Medical History:  Diagnosis Date  . Anemia   . Anxiety   . Arthritis    osteoarthritis  . ASCUS (atypical squamous cells of undetermined significance) on Pap smear 05/06/05   NEG HIGH RISK HPV--C&B BIOPSY BENIGN 12/2005  . Asthma   . Bipolar 2 disorder ([place])   . Cancer ([place])    skin cancer - basal cell  . Colon polyps   . Complication of anesthesia    anxious afterwards, will get headaches   . Constipation   . Depression   . Fibromyalgia 10/2013  . GERD (gastroesophageal reflux disease)   . 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 Heart murmur    never had any problems  . Hemorrhoids   . High cholesterol   . 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   cytology negative  . IBS (irritable bowel syndrome)   . Insomnia   . Lymphocytic colitis   . Lymphoma ([place])   . 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 Migraines   . Osteoarthritis   . Osteopenia   . Peripheral neuropathy   . PTSD (post-traumatic stress disorder)     Past Surgical History:  Procedure Laterality Date  . BONE MARROW BIOPSY Left 12/18/2013   Plasma cell dyscrasia 8% population of plasma cells  . BREAST BIOPSY Right    benign stereo  . CESAREAN  SECTION  [phone]  . CHOLECYSTECTOMY N/A 10/16/2012   Procedure: LAPAROSCOPIC CHOLECYSTECTOMY;  Surgeon: Joyice Faster. Cornett, MD;  Location: [facility];  Service: General;  Laterality: N/A;  . COLONOSCOPY     numerous times  . DILATION AND CURETTAGE OF UTERUS    . ESOPHAGOGASTRODUODENOSCOPY    . [place]   x3  . IUD REMOVAL  02/2015   Mirena  . LAPAROSCOPIC LYSIS OF ADHESIONS N/A 10/16/2012   Procedure: LAPAROSCOPIC LYSIS OF ADHESIONS;  Surgeon: Joyice Faster. Cornett, MD;  Location: [facility];  Service: General;  Laterality: N/A;  . LAPAROSCOPY N/A 10/16/2012   Procedure: LAPAROSCOPY DIAGNOSTIC;  Surgeon: Joyice Faster. Cornett, MD;  Location: [facility];  Service: General;  Laterality: N/A;  . PELVIC LAPAROSCOPY    . RADIOLOGY WITH ANESTHESIA N/A 12/16/2015   Procedure: MRI OF BRAIN WITH AND WITHOUT CONTRAST;  Surgeon: Medication Radiologist, MD;  Location: [facility];  Service: Radiology;  Laterality: N/A;  . SHOULDER SURGERY  2007/2008  . SPINE SURGERY  2010   cervical    There were no vitals filed for this visit.   Subjective Assessment - 06/30/20 1401    Subjective I am feeling pretty good today.  I was able to work in  my garden yesterday for4 hours total with rest breaks.  I had some soreness but it is not bad at all.  I am leaving for [place] on Friday.  I'll be doing a lot of walking.  PT is really helping me and it is lasting.  I feel 50% better.    Pertinent History goes by "Danielle Bruce" ; OA bilateral feet; fibromyalgia; off cycle of steriod medicine currenlty    Patient Stated Goals Pt wants pain management and be able to move more without taking medication    Currently in Pain? Yes    Pain Score 2     Pain Location Thoracic    Pain Orientation Right;Left    Pain Descriptors / Indicators Aching;Sore    Pain Onset More than a month ago    Pain Frequency Intermittent    Aggravating Factors  increased activity    Pain Relieving Factors TENs, Volataren, pain patches                              [facility] Adult PT Treatment/Exercise - 06/30/20 0001      Iontophoresis   Type of Iontophoresis Dexamethasone    Location Lt trochanteric bursa    Dose 1.0 cc   #4   Time 6 hour patch      Manual Therapy   Manual Therapy Soft tissue mobilization;Myofascial release    Myofascial Release bil upper traps, subscapularis, Lt gluteals- elongation and release            Trigger Point Dry Needling - 06/30/20 0001    Consent Given? Yes    Muscles Treated Head and Neck Upper trapezius   Lt and Rt   Muscles Treated Back/Hip Gluteus minimus;Gluteus medius   Lt only   Upper Trapezius Response Twitch reponse elicited;Palpable increased muscle length    Gluteus Minimus Response Twitch response elicited;Palpable increased muscle length    Gluteus Medius Response Twitch response elicited;Palpable increased muscle length                  PT Short Term Goals - 03/29/20 1030      PT SHORT TERM GOAL #1   Title be independent in initial HEP    Status Achieved             PT Long Term Goals - 06/30/20 1403      PT LONG TERM GOAL #1   Title be independent in advanced HEP    Status On-going      PT LONG TERM GOAL #2   Title Pt will be able to walk at least 1 mile without increased pain so she can return to [place] #3   Title report 75% less neck/UE pain throughout the day with normal functional activities    Status On-going                 Plan - 06/30/20 1443    Clinical Impression Statement Pt arrived with minimal soreness today and reports 50% overall improvement since the start of care.  Session focused on dry needling and mobilization to Lt gluteals and bil upper traps with good response with multiple twitch responses.  Pt with improved tissue mobility after manual therapy and dry needling today. Pt with significant reduction in tension and trigger points in Lt gluteals.  Pt is responding  well to ionto and manual therapy and  will continue to benefit from skilled PT to address chronic pain and improve function. [place] will [place] of the [place] 2 [place].    PT Frequency 1x / week    PT Duration 8 weeks    PT Treatment/Interventions ADLs/Self Care Home Management;Cryotherapy;Electrical Stimulation;Iontophoresis 34m/ml Dexamethasone;Moist Heat;Ultrasound;Neuromuscular re-education;Therapeutic exercise;Therapeutic activities;Patient/family education;Manual techniques;Dry needling;Passive range of motion;Taping    PT Next Visit Plan see how pt did on her trip, dry needling, exercise progression    PT Home Exercise Plan Access Code: [email or web address]    Consulted and Agree with Plan of Care Patient           Patient will benefit from skilled therapeutic intervention in order to improve the following deficits and impairments:  Abnormal gait,Increased fascial restricitons,Difficulty walking,Decreased range of motion,Pain,Decreased strength,Postural dysfunction  Visit Diagnosis: Polymyalgia rheumatica (HCC)  Muscle weakness (generalized)  Cramp and spasm  Abnormal posture     Problem List Patient Active Problem List   Diagnosis Date Noted  . GAD (generalized anxiety disorder) 12/26/2017  . OCD (obsessive compulsive disorder) 12/26/2017  . PTSD (post-traumatic stress disorder) 12/26/2017  . DDD (degenerative disc disease), cervical 07/14/2016  . Primary osteoarthritis of both feet 07/14/2016  . Primary osteoarthritis of both hands 07/14/2016  . Other fatigue 07/14/2016  . History of IBS 07/14/2016  . Osteopenia of multiple sites 07/14/2016  . Fibromyalgia 01/13/2016  . MGUS (monoclonal gammopathy of unknown significance) 12/23/2013  . Chronic cholecystitis without calculus 10/18/2012  . Abdominal pain, unspecified site 10/18/2012  . Nausea alone 10/18/2012  . Unspecified constipation 10/18/2012  . Depression 10/18/2012  . Anxiety   . ASCUS (atypical squamous cells of  undetermined significance) on Pap smear   . IUD   . Hemorrhoids 11/29/2010  . Abdominal pain, left upper quadrant 11/29/2010    KSigurd Sos PT 06/30/20 2:49 PM  [facility] [address] [place] [address]Phone: [phone]  Fax:  [phone] Name: Danielle MOUNTJOYMRN: [id number] of Birth: [birth year]

## 2020-07-09 NOTE — Progress Notes (Signed)
Office Visit Note  Patient: Danielle Bruce             Date of Birth: 1960-11-17           MRN: [id number]             PCP: Mayra Neer, MD Referring: Mayra Neer, MD Visit Date: 07/22/2020 Occupation: @GUAROCC @  Subjective:  Brain fog  History of Present Illness: Danielle Bruce is a 60 y.o. female with history of fibromyalgia, osteoarthritis, and DDD.  She has been experiencing less frequent and less severe flares since going to physical therapy consistently.  She has noticed significant benefit from dry needling therapy. She has ongoing trapezius muscle tension and tenderness but it has been tolerable and has allowed her to increase her activity level.  She still uses her TENS unit and a heating pad for pain relief.  She takes Lyrica 50 mg BID and flexeril 10 mg at bedtime for muscle spasms.  She has not needed to take methocarbamol recently. Her nocturnal pain has improved but she continues to have difficulty falling asleep. She takes trazodone 50 mg 1 tablet at bedtime for insomnia. She has been sleeping 6-9 hours per night. She has ongoing brain fog and difficulty concentrating.  She tries to write things down and set reminders for herself. She has also started to work on stress management and setting boundaries.  Overall her mood has greatly improved while working with her psychiatrist.   She has been receiving prolia injections every 6 months.  She continues to take a calcium and vitamin D supplement as recommended.   Activities of Daily Living:  Patient reports morning stiffness for 30 minutes.   Patient Reports nocturnal pain.  Difficulty dressing/grooming: Denies Difficulty climbing stairs: Denies Difficulty getting out of chair: Reports Difficulty using hands for taps, buttons, cutlery, and/or writing: Reports  Review of Systems  Constitutional: Positive for fatigue.  HENT: Positive for mouth dryness. Negative for mouth sores and nose dryness.   Eyes: Positive for  itching. Negative for pain, visual disturbance and dryness.  Respiratory: Positive for cough and shortness of breath. Negative for hemoptysis and difficulty breathing.   Cardiovascular: Positive for chest pain and swelling in legs/feet. Negative for palpitations.  Gastrointestinal: Positive for constipation and diarrhea. Negative for abdominal pain and blood in stool.  Endocrine: Negative for increased urination.  Genitourinary: Negative for painful urination.  Musculoskeletal: Positive for arthralgias, joint pain, myalgias, muscle weakness, morning stiffness, muscle tenderness and myalgias. Negative for joint swelling.  Skin: Positive for rash. Negative for color change and redness.  Allergic/Immunologic: Negative for susceptible to infections.  Neurological: Positive for dizziness, numbness, headaches, parasthesias and memory loss. Negative for weakness.  Hematological: Negative for swollen glands.  Psychiatric/Behavioral: Positive for confusion. Negative for sleep disturbance.    PMFS History:  Patient Active Problem List   Diagnosis Date Noted  . GAD (generalized anxiety disorder) 12/26/2017  . OCD (obsessive compulsive disorder) 12/26/2017  . PTSD (post-traumatic stress disorder) 12/26/2017  . DDD (degenerative disc disease), cervical 07/14/2016  . Primary osteoarthritis of both feet 07/14/2016  . Primary osteoarthritis of both hands 07/14/2016  . Other fatigue 07/14/2016  . History of IBS 07/14/2016  . Osteopenia of multiple sites 07/14/2016  . Fibromyalgia 01/13/2016  . MGUS (monoclonal gammopathy of unknown significance) 12/23/2013  . Chronic cholecystitis without calculus 10/18/2012  . Abdominal pain, unspecified site 10/18/2012  . Nausea alone 10/18/2012  . Unspecified constipation 10/18/2012  . Depression 10/18/2012  . Anxiety   .  ASCUS (atypical squamous cells of undetermined significance) on Pap smear   . IUD   . Hemorrhoids 11/29/2010  . Abdominal pain, left upper  quadrant 11/29/2010    Past Medical History:  Diagnosis Date  . Anemia   . Anxiety   . Arthritis    osteoarthritis  . ASCUS (atypical squamous cells of undetermined significance) on Pap smear 05/06/05   NEG HIGH RISK HPV--C&B BIOPSY BENIGN 12/2005  . Asthma   . Bipolar 2 disorder ([place])   . Cancer ([place])    skin cancer - basal cell  . Colon polyps   . Complication of anesthesia    anxious afterwards, will get headaches   . Constipation   . Depression   . Fibromyalgia 10/2013  . GERD (gastroesophageal reflux disease)   . 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 Heart murmur    never had any problems  . Hemorrhoids   . High cholesterol   . 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   cytology negative  . IBS (irritable bowel syndrome)   . Insomnia   . Lymphocytic colitis   . Lymphoma ([place])   . 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 Migraines   . Osteoarthritis   . Osteopenia   . Peripheral neuropathy   . PTSD (post-traumatic stress disorder)     Family History  Problem Relation Age of Onset  . Diabetes Father   . Hypertension Father   . Hyperlipidemia Father   . Heart disease Maternal Grandfather   . Heart disease Paternal Grandmother   . Heart disease Paternal Grandfather   . Depression Son   . Depression Son   . Anxiety disorder Son   . Depression Daughter   . Anxiety disorder Daughter   . Asthma Daughter    Past Surgical History:  Procedure Laterality Date  . BONE MARROW BIOPSY Left 12/18/2013   Plasma cell dyscrasia 8% population of plasma cells  . BREAST BIOPSY Right    benign stereo  . CESAREAN SECTION  [phone]  . CHOLECYSTECTOMY N/A 10/16/2012   Procedure: LAPAROSCOPIC CHOLECYSTECTOMY;  Surgeon: Joyice Faster. Cornett, MD;  Location: [facility];  Service: General;  Laterality: N/A;  . COLONOSCOPY     numerous times  . DILATION AND CURETTAGE OF UTERUS    . ESOPHAGOGASTRODUODENOSCOPY    . [place]   x3  . IUD REMOVAL  02/2015   Mirena  . LAPAROSCOPIC LYSIS OF ADHESIONS N/A 10/16/2012   Procedure: LAPAROSCOPIC LYSIS OF ADHESIONS;  Surgeon: Joyice Faster. Cornett, MD;  Location: [facility];  Service: General;  Laterality: N/A;  . LAPAROSCOPY N/A 10/16/2012   Procedure: LAPAROSCOPY DIAGNOSTIC;  Surgeon: Joyice Faster. Cornett, MD;  Location: [facility];  Service: General;  Laterality: N/A;  . PELVIC LAPAROSCOPY    . RADIOLOGY WITH ANESTHESIA N/A 12/16/2015   Procedure: MRI OF BRAIN WITH AND WITHOUT CONTRAST;  Surgeon: Medication Radiologist, MD;  Location: [facility];  Service: Radiology;  Laterality: N/A;  . SHOULDER SURGERY  2007/2008  . SPINE SURGERY  2010   cervical   Social History   Social History Narrative  . Not on file   Immunization History  Administered Date(s) Administered  . Influenza-Unspecified 11/20/2013  . PFIZER(Purple Top)SARS-COV-2 Vaccination 05/15/2019, 06/09/2019, 02/11/2020     Objective: Vital Signs: BP 121/82 (BP Location: Left Arm, Patient Position: Sitting, Cuff Size: Normal)   Pulse 92   Resp 15  Ht 5' 2"  (1.575 m)   Wt 139 lb (63 kg)   LMP 12/28/2010   BMI 25.42 kg/m    Physical Exam Vitals and nursing note reviewed.  Constitutional:      Appearance: She is well-developed.  HENT:     Head: Normocephalic and atraumatic.  Eyes:     Conjunctiva/sclera: Conjunctivae normal.  Pulmonary:     Effort: Pulmonary effort is normal.  Abdominal:     Palpations: Abdomen is soft.  Musculoskeletal:     Cervical back: Normal range of motion.  Skin:    General: Skin is warm and dry.     Capillary Refill: Capillary refill takes less than 2 seconds.  Neurological:     Mental Status: She is alert and oriented to person, place, and time.  Psychiatric:        Behavior: Behavior normal.      Musculoskeletal Exam: Positive tender points on exam. C-spine has slightly limited range of motion with lateral rotation.  Trapezius muscle tension and muscle tenderness  bilaterally.  Shoulder joints, elbow joints, wrist joints, MCPs, PIPs, DIPs have good range of motion with no synovitis.  She is able to make a complete fist bilaterally.  Hip joints have good range of motion with no discomfort.  Tenderness palpation over the left trochanteric bursa.  Knee joints have good range of motion with no warmth or effusion.  Ankle joints have good range of motion with no tenderness or joint swelling.  CDAI Exam: CDAI Score: -- Patient Global: --; Provider Global: -- Swollen: --; Tender: -- Joint Exam 07/22/2020   No joint exam has been documented for this visit   There is currently no information documented on the homunculus. Go to the Rheumatology activity and complete the homunculus joint exam.  Investigation: No additional findings.  Imaging: No results found.  Recent Labs: Lab Results  Component Value Date   WBC 5.9 08/01/2016   HGB 14.6 08/01/2016   PLT 254 08/01/2016   NA 142 08/01/2016   K 4.7 08/01/2016   CL 107 08/01/2016   CO2 23 08/01/2016   GLUCOSE 95 08/01/2016   BUN 14 08/01/2016   CREATININE 0.87 08/01/2016   BILITOT 0.7 08/01/2016   ALKPHOS 55 08/01/2016   AST 23 08/01/2016   ALT 26 08/01/2016   PROT 7.4 08/01/2016   ALBUMIN 4.8 08/01/2016   CALCIUM 10.0 08/01/2016   GFRAA 88 08/01/2016    Speciality Comments: No specialty comments available.  Procedures:  No procedures performed Allergies: Sulfa antibiotics, Gluten meal, Lactose intolerance (gi), Flagyl [metronidazole hcl], and Morphine and related   Assessment / Plan:     Visit Diagnoses: Fibromyalgia: Overall the patient is doing significantly better in regards to the management of her fibromyalgia since her last office visit on 04/13/2020.  Her pain level has been more manageable since going to physical therapy on a regular basis.  She has noticed significant benefit while receiving dry needling especially in the trapezius muscles bilaterally.  She continues to use her TENS  unit as well as a heating pad to alleviate her myalgias.  She remains on Lyrica 50 mg 1 tablet twice daily and takes Flexeril 10 mg 1 tablet by mouth at bedtime as needed for muscle spasms and insomnia.  She has not been taking methocarbamol recently due to her pain level being more tolerable.  She has been able to increase her exercise regimen gradually since returning from vacation, and she plans to continue to try to prioritize exercise  on a daily basis.  She continues to suffer from brain fog and difficulty concentrating. We also discussed the importance of stress management, exercise, and good sleep hygiene.  It remains difficult for her to fall asleep despite her nocturnal pain improving. She continues to take trazodone 50 mg 1 tablet at bedtime for insomnia. We discussed the importance of avoiding bluelight 1 hour prior to bedtime as well as taking trazodone 30 minutes prior to bedtime.  She plans on seeing her physiatrist every week for stress management.  Overall her mood and motivation continue to improve.   She will follow up in 6 months.   Other fatigue: She continues to have chronic fatigue secondary to insomnia.  Overall her energy level has started to improve.  She continues to have brain fog and difficulty concentrating at times.  Discussed the importance of daily exercise and good sleep hygiene.     Other insomnia: She takes trazodone 50 mg 1 tablet by mouth at bedtime and Flexeril 10 mg 1 tablet by mouth at bedtime as needed.  She continues to have difficulty falling asleep.  We discussed the importance of good sleep hygiene.  She was advised to avoid bluelight after 1 hour prior to bedtime.  Primary osteoarthritis of both hands: She has no discomfort in her hands at this time.  She has no joint tenderness or inflammation on exam.  She has complete fist formation bilaterally.  Primary osteoarthritis of both feet:  She went to [employer] and was fitted for new teenis shoes which have  resolved the discomfort she was experiencing in her feet previously.  Trochanteric bursitis of both hips: No tenderness to palpation over the right trochanteric bursa but she does have tenderness over the left trochanteric bursa on exam today.  She has been going to physical therapy and performing home exercises. She has tried dry needling which has been helpful. She is a side sleeper which exacerbates her discomfort, but she has a good mattress with a mattress Topper which helps to alleviate some of her discomfort.  DDD (degenerative disc disease), cervical: C-spine has slightly limited range of motion with lateral rotation.  She has trapezius muscle tension and muscle tenderness bilaterally. She noticed improvement in her symptoms have having trigger point injections performed at her last visit on 04/13/20.  She has been having dry needling performed a physical therapy which has improved her symptoms significantly.  She plans on continuing dry needling on a maintenance schedule in the future. Discussed the importance of performing neck exercises daily.   Osteopenia of multiple sites: DEXA not available in Epic.  She has an upcoming DEXA this summer according to the patient. She has not had any recent falls or fractures. She has been receiving Prolia injections every 6 months at her PCPs office.  She has been taking vitamin D 5000 units daily and a calcium supplement as recommended.  She was given a list of calcium rich foods which she can apply to her diet as well.  We discussed that ideally she should 400 mg of calcium 3 times a day.  We discussed the importance of resistive exercises.  Other medical conditions are listed as follows:  Chronic cholecystitis without calculus  Lymphocytic colitis  History of posttraumatic stress disorder (PTSD)  History of IBS  MGUS (monoclonal gammopathy of unknown significance)  Bipolar 2 disorder ([place])  Orders: No orders of the defined types were placed in  this encounter.  No orders of the defined types were placed  in this encounter.    Follow-Up Instructions: Return in about 6 months (around 01/22/2021) for Fibromyalgia, Osteoarthritis, DDD.   Ofilia Neas, PA-C  Note - This record has been created using Dragon software.  Chart creation errors have been sought, but may not always  have been located. Such creation errors do not reflect on  the standard of medical care.

## 2020-07-15 ENCOUNTER — Encounter: Payer: Self-pay | Admitting: Psychiatry

## 2020-07-16 ENCOUNTER — Ambulatory Visit: Payer: Medicare Other | Admitting: Addiction (Substance Use Disorder)

## 2020-07-16 DIAGNOSIS — H6120 Impacted cerumen, unspecified ear: Secondary | ICD-10-CM | POA: Diagnosis not present

## 2020-07-16 DIAGNOSIS — R82998 Other abnormal findings in urine: Secondary | ICD-10-CM | POA: Diagnosis not present

## 2020-07-16 DIAGNOSIS — M797 Fibromyalgia: Secondary | ICD-10-CM | POA: Diagnosis not present

## 2020-07-16 DIAGNOSIS — F411 Generalized anxiety disorder: Secondary | ICD-10-CM | POA: Diagnosis not present

## 2020-07-16 DIAGNOSIS — G43909 Migraine, unspecified, not intractable, without status migrainosus: Secondary | ICD-10-CM | POA: Diagnosis not present

## 2020-07-16 DIAGNOSIS — F4310 Post-traumatic stress disorder, unspecified: Secondary | ICD-10-CM | POA: Diagnosis not present

## 2020-07-19 ENCOUNTER — Other Ambulatory Visit: Payer: Self-pay

## 2020-07-19 ENCOUNTER — Ambulatory Visit: Payer: Medicare Other

## 2020-07-19 DIAGNOSIS — R252 Cramp and spasm: Secondary | ICD-10-CM

## 2020-07-19 DIAGNOSIS — M353 Polymyalgia rheumatica: Secondary | ICD-10-CM | POA: Diagnosis not present

## 2020-07-19 DIAGNOSIS — R293 Abnormal posture: Secondary | ICD-10-CM

## 2020-07-19 DIAGNOSIS — M6281 Muscle weakness (generalized): Secondary | ICD-10-CM | POA: Diagnosis not present

## 2020-07-19 NOTE — Therapy (Signed)
[facility] [address], [place], [place], [address] Phone: [phone]   Fax:  [phone]  Physical Therapy Treatment  Patient Details  Name: Danielle Bruce MRN: [id number] Date of Birth: 1960/06/02 Referring Provider (PT): Mayra Neer, MD   Encounter Date: 07/19/2020 Progress Note Reporting Period 04/21/20 to 07/19/20  See note below for Objective Data and Assessment of Progress/Goals.       PT End of Session - 07/19/20 1102    Visit Number 20    Date for PT Re-Evaluation 08/10/20    Authorization Type Medicare/Medicaid- KX needed    PT Start Time 1497    PT Stop Time 1059    PT Time Calculation (min) 45 min           Past Medical History:  Diagnosis Date  . Anemia   . Anxiety   . Arthritis    osteoarthritis  . ASCUS (atypical squamous cells of undetermined significance) on Pap smear 05/06/05   NEG HIGH RISK HPV--C&B BIOPSY BENIGN 12/2005  . Asthma   . Bipolar 2 disorder ([place])   . Cancer ([place])    skin cancer - basal cell  . Colon polyps   . Complication of anesthesia    anxious afterwards, will get headaches   . Constipation   . Depression   . Fibromyalgia 10/2013  . GERD (gastroesophageal reflux disease)   . 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 Heart murmur    never had any problems  . Hemorrhoids   . High cholesterol   . 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   cytology negative  . IBS (irritable bowel syndrome)   . Insomnia   . Lymphocytic colitis   . Lymphoma ([place])   . 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 Migraines   . Osteoarthritis   . Osteopenia   . Peripheral neuropathy   . PTSD (post-traumatic stress disorder)     Past Surgical History:  Procedure Laterality Date  . BONE MARROW BIOPSY Left 12/18/2013   Plasma cell dyscrasia 8% population of plasma cells  . BREAST BIOPSY Right    benign stereo  .  CESAREAN SECTION  [phone]  . CHOLECYSTECTOMY N/A 10/16/2012   Procedure: LAPAROSCOPIC CHOLECYSTECTOMY;  Surgeon: Joyice Faster. Cornett, MD;  Location[facility];  Service: General;  Laterality: N/A;  . COLONOSCOPY     numerous times  . DILATION AND CURETTAGE OF UTERUS    . ESOPHAGOGASTRODUODENOSCOPY    . [place]   x3  . IUD REMOVAL  02/2015   Mirena  . LAPAROSCOPIC LYSIS OF ADHESIONS N/A 10/16/2012   Procedure: LAPAROSCOPIC LYSIS OF ADHESIONS;  Surgeon: Joyice Faster. Cornett, MD;  [facility];  Service: General;  Laterality: N/A;  . LAPAROSCOPY N/A 10/16/2012   Procedure: LAPAROSCOPY DIAGNOSTIC;  Surgeon: Joyice Faster. Cornett, MD;  [facility];  Service: General;  Laterality: N/A;  . PELVIC LAPAROSCOPY    . RADIOLOGY WITH ANESTHESIA N/A 12/16/2015   Procedure: MRI OF BRAIN WITH AND WITHOUT CONTRAST;  Surgeon: Medication Radiologist, MD;  Location: [facility];  Service: Radiology;  Laterality: N/A;  . SHOULDER SURGERY  2007/2008  . SPINE SURGERY  2010   cervical    There were no vitals filed for this visit.   Subjective Assessment - 07/19/20 1020    Subjective My trip went really well.  I was able to walk a  lot.  I had built in rest so that was good.   I want to try to go back to the gym but covid numbers are going up.    Pertinent History goes by "Danielle Bruce" ; OA bilateral feet; fibromyalgia; off cycle of steriod medicine currenlty    Patient Stated Goals Pt wants pain management and be able to move more without taking medication    Currently in Pain? Yes    Pain Location Thoracic    Pain Orientation Right;Left    Pain Descriptors / Indicators Aching;Sore    Pain Type Chronic pain    Pain Onset More than a month ago    Pain Frequency Intermittent    Aggravating Factors  carrying luggage/backpack,    Pain Relieving Factors rest, stretching, pain patches, TENs, Voltaren              [facility] PT Assessment - 07/19/20 0001      Assessment   Medical Diagnosis M77.41,M77.42  (ICD-10-CM) - Metatarsalgia of both feet; M35.3 (ICD-10-CM) - Polymyalgia rheumatica    Referring Provider (PT) Mayra Neer, MD      Strength   Right Wrist Extension 5/5    Left Wrist Extension 5/5                         [facility] Adult PT Treatment/Exercise - 07/19/20 0001      Manual Therapy   Manual Therapy Soft tissue mobilization;Myofascial release    Myofascial Release bil upper traps, cervical paraspinals            Trigger Point Dry Needling - 07/19/20 0001    Consent Given? Yes    Muscles Treated Head and Neck Upper trapezius;Cervical multifidi   Lt and Rt   Muscles Treated Back/Hip Gluteus minimus;Gluteus medius   Lt only   Upper Trapezius Response Twitch reponse elicited;Palpable increased muscle length    Cervical multifidi Response Twitch reponse elicited;Palpable increased muscle length   C6-8   Gluteus Minimus Response Twitch response elicited;Palpable increased muscle length    Gluteus Medius Response Twitch response elicited;Palpable increased muscle length                PT Education - 07/19/20 1058    Education Details discussion regarding return to regular exercise.  Consider an elliptical and treadmill at home.    Person(s) Educated Patient    Methods Explanation    Comprehension Verbalized understanding            PT Short Term Goals - 03/29/20 1030      PT SHORT TERM GOAL #1   Title be independent in initial HEP    Status Achieved             PT Long Term Goals - 07/19/20 1103      PT LONG TERM GOAL #1   Title be independent in advanced HEP    Status On-going      PT LONG TERM GOAL #2   Title Pt will be able to walk at least 1 mile without increased pain so she can return to [place] #3   Title report 75% less neck/UE pain throughout the day with normal functional activities    Baseline not as painful and can do more stuff- 50-75% better    Status On-going      PT  LONG TERM GOAL #4   Title Pt will  be able to hold her grandchildren for at least 1 hour throughout the day in order to help her daughter with child care    Baseline I can hold them for an hour throughout the day    Status Achieved      PT LONG TERM GOAL #5   Title Pt will demonstrate at least 4+/5 wrist and hip strength for improved stability for walking and lifting    Status On-going                 Plan - 07/19/20 1102    Clinical Impression Statement Pt was traveling since the last visit and was able to do a lot of walking and exploring.  Pt did very well on this trip and arrived with minimal pain.  Pt with bil upper trap tension and Rt>Lt gluteal trigger points.  These are significantly improved and pt has maintained tissue mobility over the past 2 weeks.   PT discussed exercise progression with pt and how to add cardio to her routine in a safe way.  Pt will likely D/C in 1-2 sessions to HEP.    PT Frequency 1x / week    PT Duration 8 weeks    PT Treatment/Interventions ADLs/Self Care Home Management;Cryotherapy;Electrical Stimulation;Iontophoresis 88m/ml Dexamethasone;Moist Heat;Ultrasound;Neuromuscular re-education;Therapeutic exercise;Therapeutic activities;Patient/family education;Manual techniques;Dry needling;Passive range of motion;Taping    PT Next Visit Plan 1-2 more sessions, dry needling if needed.  discuss final HEP    PT Home Exercise Plan Access Code: [email or web address]    Consulted and Agree with Plan of Care Patient           Patient will benefit from skilled therapeutic intervention in order to improve the following deficits and impairments:  Abnormal gait,Increased fascial restricitons,Difficulty walking,Decreased range of motion,Pain,Decreased strength,Postural dysfunction  Visit Diagnosis: Polymyalgia rheumatica (HCC)  Muscle weakness (generalized)  Cramp and spasm  Abnormal posture     Problem List Patient Active Problem List   Diagnosis Date Noted  .  GAD (generalized anxiety disorder) 12/26/2017  . OCD (obsessive compulsive disorder) 12/26/2017  . PTSD (post-traumatic stress disorder) 12/26/2017  . DDD (degenerative disc disease), cervical 07/14/2016  . Primary osteoarthritis of both feet 07/14/2016  . Primary osteoarthritis of both hands 07/14/2016  . Other fatigue 07/14/2016  . History of IBS 07/14/2016  . Osteopenia of multiple sites 07/14/2016  . Fibromyalgia 01/13/2016  . MGUS (monoclonal gammopathy of unknown significance) 12/23/2013  . Chronic cholecystitis without calculus 10/18/2012  . Abdominal pain, unspecified site 10/18/2012  . Nausea alone 10/18/2012  . Unspecified constipation 10/18/2012  . Depression 10/18/2012  . Anxiety   . ASCUS (atypical squamous cells of undetermined significance) on Pap smear   . IUD   . Hemorrhoids 11/29/2010  . Abdominal pain, left upper quadrant 11/29/2010     KSigurd Sos PT 07/19/20 11:07 AM   [facility] [address] [place] [address]Phone: [phone]  Fax:  [phone] Name: CDAANA PETRASEKMRN: [id number] of Birth: 913-Jan-1962

## 2020-07-20 ENCOUNTER — Ambulatory Visit: Payer: Medicare Other | Admitting: Rheumatology

## 2020-07-22 ENCOUNTER — Ambulatory Visit (INDEPENDENT_AMBULATORY_CARE_PROVIDER_SITE_OTHER): Payer: Medicare Other | Admitting: Physician Assistant

## 2020-07-22 ENCOUNTER — Encounter: Payer: Self-pay | Admitting: Physician Assistant

## 2020-07-22 ENCOUNTER — Other Ambulatory Visit: Payer: Self-pay

## 2020-07-22 VITALS — BP 121/82 | HR 92 | Resp 15 | Ht 62.0 in | Wt 139.0 lb

## 2020-07-22 DIAGNOSIS — M19071 Primary osteoarthritis, right ankle and foot: Secondary | ICD-10-CM

## 2020-07-22 DIAGNOSIS — F3181 Bipolar II disorder: Secondary | ICD-10-CM

## 2020-07-22 DIAGNOSIS — M19072 Primary osteoarthritis, left ankle and foot: Secondary | ICD-10-CM

## 2020-07-22 DIAGNOSIS — Z8659 Personal history of other mental and behavioral disorders: Secondary | ICD-10-CM | POA: Diagnosis not present

## 2020-07-22 DIAGNOSIS — G4709 Other insomnia: Secondary | ICD-10-CM | POA: Diagnosis not present

## 2020-07-22 DIAGNOSIS — M8589 Other specified disorders of bone density and structure, multiple sites: Secondary | ICD-10-CM

## 2020-07-22 DIAGNOSIS — Z8719 Personal history of other diseases of the digestive system: Secondary | ICD-10-CM | POA: Diagnosis not present

## 2020-07-22 DIAGNOSIS — K52832 Lymphocytic colitis: Secondary | ICD-10-CM | POA: Diagnosis not present

## 2020-07-22 DIAGNOSIS — M19042 Primary osteoarthritis, left hand: Secondary | ICD-10-CM

## 2020-07-22 DIAGNOSIS — R5383 Other fatigue: Secondary | ICD-10-CM

## 2020-07-22 DIAGNOSIS — M7062 Trochanteric bursitis, left hip: Secondary | ICD-10-CM

## 2020-07-22 DIAGNOSIS — M797 Fibromyalgia: Secondary | ICD-10-CM | POA: Diagnosis not present

## 2020-07-22 DIAGNOSIS — M5030 Other cervical disc degeneration, unspecified cervical region: Secondary | ICD-10-CM | POA: Diagnosis not present

## 2020-07-22 DIAGNOSIS — K811 Chronic cholecystitis: Secondary | ICD-10-CM | POA: Diagnosis not present

## 2020-07-22 DIAGNOSIS — M19041 Primary osteoarthritis, right hand: Secondary | ICD-10-CM | POA: Diagnosis not present

## 2020-07-22 DIAGNOSIS — M7061 Trochanteric bursitis, right hip: Secondary | ICD-10-CM | POA: Diagnosis not present

## 2020-07-22 DIAGNOSIS — D472 Monoclonal gammopathy: Secondary | ICD-10-CM

## 2020-07-28 ENCOUNTER — Ambulatory Visit (INDEPENDENT_AMBULATORY_CARE_PROVIDER_SITE_OTHER): Payer: Medicare Other | Admitting: Addiction (Substance Use Disorder)

## 2020-07-28 ENCOUNTER — Ambulatory Visit: Payer: Medicare Other | Attending: Family Medicine

## 2020-07-28 ENCOUNTER — Other Ambulatory Visit: Payer: Self-pay

## 2020-07-28 DIAGNOSIS — M353 Polymyalgia rheumatica: Secondary | ICD-10-CM | POA: Diagnosis not present

## 2020-07-28 DIAGNOSIS — M6281 Muscle weakness (generalized): Secondary | ICD-10-CM | POA: Insufficient documentation

## 2020-07-28 DIAGNOSIS — F4310 Post-traumatic stress disorder, unspecified: Secondary | ICD-10-CM

## 2020-07-28 DIAGNOSIS — R252 Cramp and spasm: Secondary | ICD-10-CM | POA: Diagnosis not present

## 2020-07-28 DIAGNOSIS — R293 Abnormal posture: Secondary | ICD-10-CM | POA: Insufficient documentation

## 2020-07-28 NOTE — Progress Notes (Signed)
[facility] Counselor/Therapist Progress Note  Patient ID: Danielle Bruce, MRN: [id number],    Date: 07/28/2020  Time Spent: 19mins  Treatment Type: Individual Therapy  Reported Symptoms: anxiety, stress, memory issues   Mental Status Exam:   Appearance:   Well Groomed     Behavior:  Sharing  Motor:  Normal  Speech/Language:   Clear and Coherent  Affect:  Appropriate  Mood:  anxious  Thought process:  flight of ideas  Thought content:    Rumination  Sensory/Perceptual disturbances:    Flashback  Orientation:  Oriented x4  Attention:  Good  Concentration:  Fair  Memory:  some holes  Fund of knowledge:   Fair  Insight:    Good  Judgment:   Good  Impulse Control:  Good   Risk Assessment: Danger to Self:  No Self-injurious Behavior: No Danger to Others: No Duty to Warn:no Physical Aggression / Violence:No  Access to Firearms a concern: No  Gang Involvement:No   Subjective: Client came in reporting anxiety about starting trauma treatment and having to go back through details of her trauma. Client explained how feeling this makes her want to push through, not validating her feelings and need to protect herself from danger. Therapist encouraged client to go slow in sharing her feelings and lead with compassion and curiosity when processing how she feels. Client also processed 2 parts of her that feel different things to try to protect her and therapist normalized this, encouraging client to be sensitive to both parts. Therapist used MI & Psychoed to help client understand more about trauma and memories and how it affects [place], helping her know more of where they may want to go in therapy, while helping her understand the lack of need to go back through all trauma details. Client also processed feeling grief about not being able to remember positive things in her life and only negative, but now understanding why. Therapist assessed for safety and stability and client denied  SI/HI/AVH but a need to process more present day items first.   Interventions: Motivational Interviewing and Psycho-education/Bibliotherapy  Diagnosis:   ICD-10-CM   1. PTSD (post-traumatic stress disorder)  F43.10    Plan of Care for emotion regulation:  Client to return for weekly therapy with therapist [name], for outpatient therapy and see medication provider for support of mood management.  Client to engage in CBT: challenging negative internal ruminationsand self-talk AEB expressing toxic thoughts and challenging them with truth.  Client to practice DBT distress tolerance skills (such as distress tolerance and emotion regulation skills and achieving wise mind) to build support for dealing with emotional outbursts or internal emotional collapse AEB: using TIP, learning to ride the wave of emotion/sensations, staying in the present, and increasing their belief that they can do hard things.  Client to utilize BSP (brainspotting) with therapist to help client identify and process triggers for their emotional dysregulation, with goal of reducing said SUDs by 33% each session. Client to ground their body if overwhelmed, flooded, or disassociating and not feeling safe using focused mindfulness, grounding, or meditation.  Client also to create more stability & structure AEB following goals to assist in helping stabilize their life domains, helping to calm their nervous system and to build healthy brain neuropathways. Client to prioritize self-care techniques and implement coping strategies to reduce the use of behaviors that incur consequences to themselves or others around them. Client is to practice self-compassion AEB being gentle with themselves, utilizing self-care  techniques daily or as needed when grieving something in the moment.  Client is to process grief/pain of her loss of her ex-boyfriend in a somatic body-felt sense way: ie using mindfulness, brainspotting, or trauma release as a  method for releasing body pain/tension caused by grief.  [name], LCSW, LCAS, CCTP, CCS, BSP

## 2020-07-28 NOTE — Therapy (Signed)
[facility] [address], [place], [place], [address] Phone: [phone]   Fax:  [phone]  Physical Therapy Treatment  Patient Details  Name: Danielle Bruce MRN: [id number] Date of Birth: 1960/12/22 Referring Provider (PT): Mayra Neer, MD   Encounter Date: 07/28/2020   PT End of Session - 07/28/20 1232    Visit Number 21    Date for PT Re-Evaluation 08/10/20    Authorization Type Medicare/Medicaid- KX needed    PT Start Time 1147    PT Stop Time 1226    PT Time Calculation (min) 39 min    Activity Tolerance Patient tolerated treatment well    Behavior During Therapy [facility] for tasks assessed/performed           Past Medical History:  Diagnosis Date  . Anemia   . Anxiety   . Arthritis    osteoarthritis  . ASCUS (atypical squamous cells of undetermined significance) on Pap smear 05/06/05   NEG HIGH RISK HPV--C&B BIOPSY BENIGN 12/2005  . Asthma   . Bipolar 2 disorder ([place])   . Cancer ([place])    skin cancer - basal cell  . Colon polyps   . Complication of anesthesia    anxious afterwards, will get headaches   . Constipation   . Depression   . Fibromyalgia 10/2013  . GERD (gastroesophageal reflux disease)   . 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 Heart murmur    never had any problems  . Hemorrhoids   . High cholesterol   . 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   cytology negative  . IBS (irritable bowel syndrome)   . Insomnia   . Lymphocytic colitis   . Lymphoma ([place])   . 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 Migraines   . Osteoarthritis   . Osteopenia   . Peripheral neuropathy   . PTSD (post-traumatic stress disorder)     Past Surgical History:  Procedure Laterality Date  . BONE MARROW BIOPSY Left 12/18/2013   Plasma cell dyscrasia 8% population of plasma cells  . BREAST BIOPSY Right    benign stereo  . CESAREAN SECTION   [phone]  . CHOLECYSTECTOMY N/A 10/16/2012   Procedure: LAPAROSCOPIC CHOLECYSTECTOMY;  Surgeon: Joyice Faster. Cornett, MD;  Location: [facility];  Service: General;  Laterality: N/A;  . COLONOSCOPY     numerous times  . DILATION AND CURETTAGE OF UTERUS    . ESOPHAGOGASTRODUODENOSCOPY    . [place]   x3  . IUD REMOVAL  02/2015   Mirena  . LAPAROSCOPIC LYSIS OF ADHESIONS N/A 10/16/2012   Procedure: LAPAROSCOPIC LYSIS OF ADHESIONS;  Surgeon: Joyice Faster. Cornett, MD;  [facility]: [facility];  Service: General;  Laterality: N/A;  . LAPAROSCOPY N/A 10/16/2012   Procedure: LAPAROSCOPY DIAGNOSTIC;  Surgeon: Joyice Faster. Cornett, MD;  Location: [facility];  Service: General;  Laterality: N/A;  . PELVIC LAPAROSCOPY    . RADIOLOGY WITH ANESTHESIA N/A 12/16/2015   Procedure: MRI OF BRAIN WITH AND WITHOUT CONTRAST;  Surgeon: Medication Radiologist, MD;  Location: [facility];  Service: Radiology;  Laterality: N/A;  . SHOULDER SURGERY  2007/2008  . SPINE SURGERY  2010   cervical    There were no vitals filed for this visit.   Subjective Assessment - 07/28/20 1151    Subjective I saw my rheumatologist and she wants me to keep doing PT.  I have  been caring for my grandbabies and doing yardwork so my upper traps and gluteals and tight and painful.    Patient Stated Goals Pt wants pain management and be able to move more without taking medication    Currently in Pain? Yes    Pain Score 5     Pain Location Neck    Pain Orientation Right;Left    Pain Descriptors / Indicators Aching;Sore    Pain Type Chronic pain    Pain Onset More than a month ago    Pain Frequency Intermittent    Aggravating Factors  carrying babies, yardwork    Pain Relieving Factors rest, stretching, pain patches, TENs, leaning head back                             [facility] Adult PT Treatment/Exercise - 07/28/20 0001      Manual Therapy   Manual Therapy Soft tissue mobilization;Myofascial release    Myofascial Release bil  upper traps, cervical paraspinals            Trigger Point Dry Needling - 07/28/20 0001    Consent Given? Yes    Muscles Treated Head and Neck Upper trapezius;Cervical multifidi;Oblique capitus;Suboccipitals    Oblique Capitus Response Twitch response elicited;Palpable increased muscle length    Suboccipitals Response Twitch response elicited;Palpable increased muscle length    Cervical multifidi Response Twitch reponse elicited;Palpable increased muscle length    Gluteus Minimus Response Twitch response elicited;Palpable increased muscle length    Gluteus Medius Response Twitch response elicited;Palpable increased muscle length                  PT Short Term Goals - 03/29/20 1030      PT SHORT TERM GOAL #1   Title be independent in initial HEP    Status Achieved             PT Long Term Goals - 07/28/20 1158      PT LONG TERM GOAL #1   Title be independent in advanced HEP    Status On-going      PT LONG TERM GOAL #2   Title Pt will be able to walk at least 1 mile without increased pain so she can return to birding    Baseline I can walk 1 mile    Status Achieved      PT LONG TERM GOAL #3   Title report 75% less neck/UE pain throughout the day with normal functional activities    Baseline not as painful and can do more stuff- 50-75% better    Status Achieved      PT LONG TERM GOAL #4   Title Pt will be able to hold her grandchildren for at least 1 hour throughout the day in order to help her daughter with child care    Status Achieved                 Plan - 07/28/20 1232    Clinical Impression Statement Pt continues to report that she is able to do more at home including yardwork and care of her grandchildren.  Pt is performing graded activity and assessing pain in order to progress to longer periods of time.  Pt with increased neck and shoulder  tension and pain today due to increased yardwork and care of her grandchildren.  Pt with good response to  dry needling and manual today with reduced pain and improved tissue mobility.  Pt will perform ERO next session and will likely reduce frequency to see how she does with more time between sessions.    PT Frequency 2x / week    PT Duration 8 weeks    PT Treatment/Interventions ADLs/Self Care Home Management;Cryotherapy;Electrical Stimulation;Iontophoresis 36m/ml Dexamethasone;Moist Heat;Ultrasound;Neuromuscular re-education;Therapeutic exercise;Therapeutic activities;Patient/family education;Manual techniques;Dry needling;Passive range of motion;Taping    PT Next Visit Plan ERO next session- reduce frequency    PT Home Exercise Plan Access Code: [email or web address]    Consulted and Agree with Plan of Care Patient           Patient will benefit from skilled therapeutic intervention in order to improve the following deficits and impairments:  Abnormal gait,Increased fascial restricitons,Difficulty walking,Decreased range of motion,Pain,Decreased strength,Postural dysfunction  Visit Diagnosis: Polymyalgia rheumatica (HCC)  Muscle weakness (generalized)  Cramp and spasm  Abnormal posture     Problem List Patient Active Problem List   Diagnosis Date Noted  . GAD (generalized anxiety disorder) 12/26/2017  . OCD (obsessive compulsive disorder) 12/26/2017  . PTSD (post-traumatic stress disorder) 12/26/2017  . DDD (degenerative disc disease), cervical 07/14/2016  . Primary osteoarthritis of both feet 07/14/2016  . Primary osteoarthritis of both hands 07/14/2016  . Other fatigue 07/14/2016  . History of IBS 07/14/2016  . Osteopenia of multiple sites 07/14/2016  . Fibromyalgia 01/13/2016  . MGUS (monoclonal gammopathy of unknown significance) 12/23/2013  . Chronic cholecystitis without calculus 10/18/2012  . Abdominal pain, unspecified site 10/18/2012  . Nausea alone 10/18/2012  . Unspecified constipation 10/18/2012  . Depression 10/18/2012  . Anxiety   . ASCUS (atypical squamous cells of  undetermined significance) on Pap smear   . IUD   . Hemorrhoids 11/29/2010  . Abdominal pain, left upper quadrant 11/29/2010     KSigurd Sos PT 07/28/20 12:35 PM  [facility] [address] [place] [address]Phone: [phone]  Fax:  [phone] Name: Danielle Bruce: [id number] of Birth: 9Jul 01, 1962

## 2020-07-30 DIAGNOSIS — H524 Presbyopia: Secondary | ICD-10-CM | POA: Diagnosis not present

## 2020-07-30 DIAGNOSIS — H40023 Open angle with borderline findings, high risk, bilateral: Secondary | ICD-10-CM | POA: Diagnosis not present

## 2020-07-30 DIAGNOSIS — H2513 Age-related nuclear cataract, bilateral: Secondary | ICD-10-CM | POA: Diagnosis not present

## 2020-07-30 DIAGNOSIS — H43813 Vitreous degeneration, bilateral: Secondary | ICD-10-CM | POA: Diagnosis not present

## 2020-08-04 ENCOUNTER — Other Ambulatory Visit: Payer: Self-pay

## 2020-08-04 ENCOUNTER — Ambulatory Visit (INDEPENDENT_AMBULATORY_CARE_PROVIDER_SITE_OTHER): Payer: Medicare Other | Admitting: Obstetrics & Gynecology

## 2020-08-04 ENCOUNTER — Ambulatory Visit: Payer: Medicare Other

## 2020-08-04 DIAGNOSIS — M353 Polymyalgia rheumatica: Secondary | ICD-10-CM

## 2020-08-04 DIAGNOSIS — R293 Abnormal posture: Secondary | ICD-10-CM

## 2020-08-04 DIAGNOSIS — Z5329 Procedure and treatment not carried out because of patient's decision for other reasons: Secondary | ICD-10-CM

## 2020-08-04 DIAGNOSIS — M6281 Muscle weakness (generalized): Secondary | ICD-10-CM | POA: Diagnosis not present

## 2020-08-04 DIAGNOSIS — R252 Cramp and spasm: Secondary | ICD-10-CM | POA: Diagnosis not present

## 2020-08-04 NOTE — Therapy (Signed)
[facility] [address] [place], [place], [address] Phone: [phone]   Fax:  [phone]  Physical Therapy Treatment  Patient Details  Name: Danielle Bruce MRN: [id number] Date of Birth: [birth year] Referring Provider (PT): Mayra Neer, MD   Encounter Date: 08/04/2020   PT End of Session - 08/04/20 1111    Visit Number 22    Date for PT Re-Evaluation 10/27/20    Authorization Type Medicare/Medicaid- KX needed    PT Start Time 1018    PT Stop Time 1102    PT Time Calculation (min) 44 min    Activity Tolerance Patient tolerated treatment well    Behavior During Therapy [facility] for tasks assessed/performed           Past Medical History:  Diagnosis Date  . Anemia   . Anxiety   . Arthritis    osteoarthritis  . ASCUS (atypical squamous cells of undetermined significance) on Pap smear 05/06/05   NEG HIGH RISK HPV--C&B BIOPSY BENIGN 12/2005  . Asthma   . Bipolar 2 disorder ([place])   . Cancer ([place])    skin cancer - basal cell  . Colon polyps   . Complication of anesthesia    anxious afterwards, will get headaches   . Constipation   . Depression   . Fibromyalgia 10/2013  . GERD (gastroesophageal reflux disease)   . 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 Heart murmur    never had any problems  . Hemorrhoids   . High cholesterol   . 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   cytology negative  . IBS (irritable bowel syndrome)   . Insomnia   . Lymphocytic colitis   . Lymphoma ([place])   . 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 Migraines   . Osteoarthritis   . Osteopenia   . Peripheral neuropathy   . PTSD (post-traumatic stress disorder)     Past Surgical History:  Procedure Laterality Date  . BONE MARROW BIOPSY Left 12/18/2013   Plasma cell dyscrasia 8% population of plasma cells  . BREAST BIOPSY Right    benign stereo  . CESAREAN SECTION   [phone]  . CHOLECYSTECTOMY N/A 10/16/2012   Procedure: LAPAROSCOPIC CHOLECYSTECTOMY;  Surgeon: Joyice Faster. Cornett, MD;  [facility];  Service: General;  Laterality: N/A;  . COLONOSCOPY     numerous times  . DILATION AND CURETTAGE OF UTERUS    . ESOPHAGOGASTRODUODENOSCOPY    . [place]   x3  . IUD REMOVAL  02/2015   Mirena  . LAPAROSCOPIC LYSIS OF ADHESIONS N/A 10/16/2012   Procedure: LAPAROSCOPIC LYSIS OF ADHESIONS;  Surgeon: Joyice Faster. Cornett, MD;  [facility];  Service: General;  Laterality: N/A;  . LAPAROSCOPY N/A 10/16/2012   Procedure: LAPAROSCOPY DIAGNOSTIC;  Surgeon: Joyice Faster. Cornett, MD;  [facility];  Service: General;  Laterality: N/A;  . PELVIC LAPAROSCOPY    . RADIOLOGY WITH ANESTHESIA N/A 12/16/2015   Procedure: MRI OF BRAIN WITH AND WITHOUT CONTRAST;  Surgeon: Medication Radiologist, MD;  Location: [facility];  Service: Radiology;  Laterality: N/A;  . SHOULDER SURGERY  2007/2008  . SPINE SURGERY  2010   cervical    There were no vitals filed for this visit.   Subjective Assessment - 08/04/20 1023    Subjective I feel so much better.   I am able to do so much with  less pain.    Pertinent History goes by "Danielle Bruce" ; OA bilateral feet; fibromyalgia; off cycle of steriod medicine currenlty    Currently in Pain? Yes    Pain Score 3     Pain Location Neck    Pain Orientation Right;Left    Pain Descriptors / Indicators Aching;Sore    Pain Type Chronic pain    Pain Radiating Towards Bil hips: sore from activity    Pain Onset More than a month ago    Pain Frequency Intermittent    Aggravating Factors  carrying babies, yardwork    Pain Relieving Factors rest, stretching, pain patches              [facility] PT Assessment - 08/04/20 0001      Assessment   Medical Diagnosis M77.41,M77.42 (ICD-10-CM) - Metatarsalgia of both feet; M35.3 (ICD-10-CM) - Polymyalgia rheumatica    Referring Provider (PT) Mayra Neer, MD    Onset Date/Surgical Date  07/29/19    Hand Dominance Right      Posture/Postural Control   Posture Comments NDI: 46% disability      Strength   Right Wrist Extension 5/5    Left Wrist Extension 5/5    Right Hip ABduction 4+/5    Left Hip ABduction 4+/5                         [facility] Adult PT Treatment/Exercise - 08/04/20 0001      Manual Therapy   Manual Therapy Soft tissue mobilization;Myofascial release    Myofascial Release bil upper traps, cervical paraspinals            Trigger Point Dry Needling - 08/04/20 0001    Consent Given? Yes    Muscles Treated Head and Neck Upper trapezius;Cervical multifidi;Oblique capitus;Suboccipitals    Oblique Capitus Response Twitch response elicited;Palpable increased muscle length    Suboccipitals Response Twitch response elicited;Palpable increased muscle length    Cervical multifidi Response Twitch reponse elicited;Palpable increased muscle length    Gluteus Minimus Response Twitch response elicited;Palpable increased muscle length    Gluteus Medius Response Twitch response elicited;Palpable increased muscle length                  PT Short Term Goals - 03/29/20 1030      PT SHORT TERM GOAL #1   Title be independent in initial HEP    Status Achieved             PT Long Term Goals - 08/04/20 1025      PT LONG TERM GOAL #1   Title be independent in advanced HEP    Time 12    Period Weeks    Status On-going    Target Date 10/27/20      PT LONG TERM GOAL #2   Title Pt will be able to walk at least 1 mile without increased pain so she can return to birding    Status Achieved      PT LONG TERM GOAL #3   Title report 75% less neck/UE pain throughout the day with normal functional activities    Baseline not as painful and can do more stuff- 50-75% better    Status On-going    Target Date 10/27/20      PT LONG TERM GOAL #4   Title Pt will be able to hold her grandchildren for at least 1 hour throughout the day in order to  help her daughter with child  care    Baseline I can hold them for an hour throughout the day    Status Achieved      PT LONG TERM GOAL #5   Title Pt will demonstrate at least hip strength of 4+/5 for  improved stability for walking and lifting her grandchildren    Baseline 4/5 hip strength on Lt side    Time 12    Period Weeks    Status On-going    Target Date 10/27/20      Additional Long Term Goals   Additional Long Term Goals Yes      PT LONG TERM GOAL #6   Title improve NDI to < or = to 30% disability    Baseline 46% disability    Time 12    Period Weeks    Status New    Target Date 10/27/20                 Plan - 08/04/20 1035    Clinical Impression Statement Pt continues to report that she is able to do more at home including yardwork and care of her grandchildren.  Pt is 50-75% overall improvement in symptoms. Pt is performing graded activity and assessing pain in order to progress to longer periods of time.  Pt with increased neck and shoulder tension and pain today due to increased yardwork and care of her grandchildren over the past 2 weeks.  Pt with good response to dry needling and manual today with reduced pain and improved tissue mobility.  Pt continues to do her HEP and will continue to benefit from PT every 3 weeks for manual therapy and progression of exercise to manage chronic pain condition.    PT Frequency Biweekly   every 3 weeks   PT Duration 12 weeks    PT Treatment/Interventions ADLs/Self Care Home Management;Cryotherapy;Electrical Stimulation;Iontophoresis 69m/ml Dexamethasone;Moist Heat;Ultrasound;Neuromuscular re-education;Therapeutic exercise;Therapeutic activities;Patient/family education;Manual techniques;Dry needling;Passive range of motion;Taping    PT Next Visit Plan reduce frequency.  Pt to continue with HEP and will attend PT for manual to address tension and to make HEP modifications .    PT Home Exercise Plan Access Code: [email or web address]     Recommended Other Services recert sent 62/8/41   Consulted and Agree with Plan of Care Patient           Patient will benefit from skilled therapeutic intervention in order to improve the following deficits and impairments:  Abnormal gait,Increased fascial restricitons,Difficulty walking,Decreased range of motion,Pain,Decreased strength,Postural dysfunction  Visit Diagnosis: [place] - Plan: PT plan of care cert/re-cert  Muscle weakness (generalized) - Plan: PT plan of care cert/re-cert  Cramp and spasm - Plan: PT plan of care cert/re-cert  Abnormal posture - Plan: PT plan of care cert/re-cert     Problem List Patient Active Problem List   Diagnosis Date Noted  . GAD (generalized anxiety disorder) 12/26/2017  . OCD (obsessive compulsive disorder) 12/26/2017  . PTSD (post-traumatic stress disorder) 12/26/2017  . DDD (degenerative disc disease), cervical 07/14/2016  . Primary osteoarthritis of both feet 07/14/2016  . Primary osteoarthritis of both hands 07/14/2016  . Other fatigue 07/14/2016  . History of IBS 07/14/2016  . Osteopenia of multiple sites 07/14/2016  . Fibromyalgia 01/13/2016  . MGUS (monoclonal gammopathy of unknown significance) 12/23/2013  . Chronic cholecystitis without calculus 10/18/2012  . Abdominal pain, unspecified site 10/18/2012  . Nausea alone 10/18/2012  . Unspecified constipation 10/18/2012  . Depression 10/18/2012  . Anxiety   . ASCUS (atypical  squamous cells of undetermined significance) on Pap smear   . IUD   . Hemorrhoids 11/29/2010  . Abdominal pain, left upper quadrant 11/29/2010     Sigurd Sos, PT 08/04/20 11:14 AM  [facility] [address], [place], [place], [address] Phone: [phone]   Fax:  [phone]  Name: Danielle Bruce MRN: [id number] Date of Birth: 1960-04-16

## 2020-08-17 ENCOUNTER — Other Ambulatory Visit: Payer: Self-pay

## 2020-08-17 ENCOUNTER — Ambulatory Visit: Payer: Medicare Other

## 2020-08-17 DIAGNOSIS — M353 Polymyalgia rheumatica: Secondary | ICD-10-CM | POA: Diagnosis not present

## 2020-08-17 DIAGNOSIS — R252 Cramp and spasm: Secondary | ICD-10-CM | POA: Diagnosis not present

## 2020-08-17 DIAGNOSIS — M6281 Muscle weakness (generalized): Secondary | ICD-10-CM | POA: Diagnosis not present

## 2020-08-17 DIAGNOSIS — R293 Abnormal posture: Secondary | ICD-10-CM | POA: Diagnosis not present

## 2020-08-17 NOTE — Patient Instructions (Signed)
Walk at least 5 minutes 2-3 times a day.   Meditation for pain, stress relief, relaxation  Return to stretching   Relax your shoulders  Laugh[employer]

## 2020-08-17 NOTE — Therapy (Signed)
[facility] [address], [place], [place], [address] Phone: [phone]   Fax:  [phone]  Physical Therapy Treatment  Patient Details  Name: Danielle Bruce MRN: [id number] Date of Birth: 08/08/1960 Referring Provider (PT): Mayra Neer, MD   Encounter Date: 08/17/2020   PT End of Session - 08/17/20 1234     Visit Number 23    Date for PT Re-Evaluation 10/27/20    Authorization Type Medicare/Medicaid- KX needed    PT Start Time 1149    PT Stop Time 1229    PT Time Calculation (min) 40 min    Activity Tolerance Patient tolerated treatment well    Behavior During Therapy [facility] for tasks assessed/performed             Past Medical History:  Diagnosis Date   Anemia    Anxiety    Arthritis    osteoarthritis   ASCUS (atypical squamous cells of undetermined significance) on Pap smear 05/06/05   NEG HIGH RISK HPV--C&B BIOPSY BENIGN 12/2005   Asthma    Bipolar 2 disorder ([place])    Cancer ([place])    skin cancer - basal cell   Colon polyps    Complication of anesthesia    anxious afterwards, will get headaches    Constipation    Depression    Fibromyalgia 10/2013   GERD (gastroesophageal reflux disease)    Hearing loss on left    Heart murmur    never had any problems   Hemorrhoids    High cholesterol    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   cytology negative   IBS (irritable bowel syndrome)    Insomnia    Lymphocytic colitis    Lymphoma ([place])    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   Bone marrow biopsy showes 8% plasma cells IgA Lambda   Migraines    Osteoarthritis    Osteopenia    Peripheral neuropathy    PTSD (post-traumatic stress disorder)     Past Surgical History:  Procedure Laterality Date   BONE MARROW BIOPSY Left 12/18/2013   Plasma cell dyscrasia 8% population of plasma cells   BREAST BIOPSY Right    benign stereo   CESAREAN SECTION  66,06,30    CHOLECYSTECTOMY N/A 10/16/2012   Procedure: LAPAROSCOPIC CHOLECYSTECTOMY;  Surgeon: Joyice Faster. Cornett, MD;  Location: [facility];  Service: General;  Laterality: N/A;   COLONOSCOPY     numerous times   DILATION AND CURETTAGE OF UTERUS     ESOPHAGOGASTRODUODENOSCOPY     HEMORRHOID SURGERY  1993   x3   IUD REMOVAL  02/2015   Mirena   LAPAROSCOPIC LYSIS OF ADHESIONS N/A 10/16/2012   Procedure: LAPAROSCOPIC LYSIS OF ADHESIONS;  Surgeon: Marcello Moores A. Cornett, MD;  Location: [facility];  Service: General;  Laterality: N/A;   LAPAROSCOPY N/A 10/16/2012   Procedure: LAPAROSCOPY DIAGNOSTIC;  Surgeon: Joyice Faster. Cornett, MD;  Location: [facility];  Service: General;  Laterality: N/A;   PELVIC LAPAROSCOPY     RADIOLOGY WITH ANESTHESIA N/A 12/16/2015   Procedure: MRI OF BRAIN WITH AND WITHOUT CONTRAST;  Surgeon: Medication Radiologist, MD;  Location: [facility];  Service: Radiology;  Laterality: N/A;   SHOULDER SURGERY  2007/2008   SPINE SURGERY  2010   cervical    There were no vitals filed for this visit.   Subjective Assessment - 08/17/20 1150     Subjective My dog died 11 days ago.  She was my emotional  support animal and was always with me.  My fibro has flared back up and my depresion is worse.  I am really hurting and wanted to get in sooner.    Pertinent History goes by "Danielle Bruce" ; OA bilateral feet; fibromyalgia; off cycle of steriod medicine currenlty    Patient Stated Goals Pt wants pain management and be able to move more without taking medication    Currently in Pain? Yes    Pain Orientation Left;Right    Pain Descriptors / Indicators Aching;Sore    Pain Type Chronic pain    Pain Onset More than a month ago    Pain Frequency Constant    Aggravating Factors  stress, depression    Pain Relieving Factors rest, stretching, pain patches                               [facility] Adult PT Treatment/Exercise - 08/17/20 0001       Manual Therapy   Manual Therapy Soft tissue  mobilization;Myofascial release    Myofascial Release bil upper traps, cervical paraspinals              Trigger Point Dry Needling - 08/17/20 0001     Consent Given? Yes    Muscles Treated Head and Neck Upper trapezius;Cervical multifidi    Muscles Treated Upper Quadrant Rhomboids   Lt only   Upper Trapezius Response Twitch reponse elicited;Palpable increased muscle length    Cervical multifidi Response Twitch reponse elicited;Palpable increased muscle length    Rhomboids Response Twitch response elicited;Palpable increased muscle length                  PT Education - 08/17/20 1227     Education Details Instructions on how to improve mood: stretch, meditation, walk    Person(s) Educated Patient    Methods Handout;Explanation    Comprehension Verbalized understanding              PT Short Term Goals - 03/29/20 1030       PT SHORT TERM GOAL #1   Title be independent in initial HEP    Status Achieved               PT Long Term Goals - 08/04/20 1025       PT LONG TERM GOAL #1   Title be independent in advanced HEP    Time 12    Period Weeks    Status On-going    Target Date 10/27/20      PT LONG TERM GOAL #2   Title Pt will be able to walk at least 1 mile without increased pain so she can return to [place] #3   Title report 75% less neck/UE pain throughout the day with normal functional activities    Baseline not as painful and can do more stuff- 50-75% better    Status On-going    Target Date 10/27/20      PT LONG TERM GOAL #4   Title Pt will be able to hold her grandchildren for at least 1 hour throughout the day in order to help her daughter with child care    Baseline I can hold them for an hour throughout the day    Status Achieved      PT LONG TERM GOAL #5   Title Pt will demonstrate at least hip  strength of 4+/5 for  improved stability for walking and lifting her grandchildren    Baseline  4/5 hip strength on Lt side    Time 12    Period Weeks    Status On-going    Target Date 10/27/20      Additional Long Term Goals   Additional Long Term Goals Yes      PT LONG TERM GOAL #6   Title improve NDI to < or = to 30% disability    Baseline 46% disability    Time 12    Period Weeks    Status New    Target Date 10/27/20                   Plan - 08/17/20 1233     Clinical Impression Statement Pt arrived emotional due to loss of her dog recently.  This dog served as emotional support for her and helped her to manage her pain.  Session spent with some verbal counseling and dry needling to areas of greatest tension including dry needling to neck and Lt thoracic spine to address Lt sided neck and arm pain.  Pt with significant trigger point in Lt upper traps and demonstrated improved mobility and less pain after manual.  PT instructed pt to walk 5 min 2-3 times a day, meditate and return to stretching.  A list was provided to pt.  Pt will continue to benefit from skilled PT to address chronic pain, mobility and strength.    PT Frequency Biweekly    PT Duration 12 weeks    PT Treatment/Interventions ADLs/Self Care Home Management;Cryotherapy;Electrical Stimulation;Iontophoresis 35m/ml Dexamethasone;Moist Heat;Ultrasound;Neuromuscular re-education;Therapeutic exercise;Therapeutic activities;Patient/family education;Manual techniques;Dry needling;Passive range of motion;Taping    PT Next Visit Plan manual therapy and stress management as needed next session    PT Home Exercise Plan Access Code: [email or web address]    Consulted and Agree with Plan of Care Patient             Patient will benefit from skilled therapeutic intervention in order to improve the following deficits and impairments:  Abnormal gait, Increased fascial restricitons, Difficulty walking, Decreased range of motion, Pain, Decreased strength, Postural dysfunction  Visit Diagnosis: [place]  [place]  Muscle weakness (generalized)  Cramp and spasm     Problem List Patient Active Problem List   Diagnosis Date Noted   GAD (generalized anxiety disorder) 12/26/2017   OCD (obsessive compulsive disorder) 12/26/2017   PTSD (post-traumatic stress disorder) 12/26/2017   DDD (degenerative disc disease), cervical 07/14/2016   Primary osteoarthritis of both feet 07/14/2016   Primary osteoarthritis of both hands 07/14/2016   Other fatigue 07/14/2016   History of IBS 07/14/2016   Osteopenia of multiple sites 07/14/2016   Fibromyalgia 01/13/2016   MGUS (monoclonal gammopathy of unknown significance) 12/23/2013   Chronic cholecystitis without calculus 10/18/2012   Abdominal pain, unspecified site 10/18/2012   Nausea alone 10/18/2012   Unspecified constipation 10/18/2012   Depression 10/18/2012   Anxiety    ASCUS (atypical squamous cells of undetermined significance) on Pap smear    IUD    Hemorrhoids 11/29/2010   Abdominal pain, left upper quadrant 11/29/2010    KSigurd Sos PT 08/17/20 12:36 PM   [facility] [address] [place] [address]Phone: [phone]  Fax:  [phone] Name: Danielle NESTLERMRN: [id number] of Birth: 905/19/1962

## 2020-08-19 ENCOUNTER — Ambulatory Visit (INDEPENDENT_AMBULATORY_CARE_PROVIDER_SITE_OTHER): Payer: Medicare Other | Admitting: Psychiatry

## 2020-08-19 ENCOUNTER — Other Ambulatory Visit: Payer: Self-pay | Admitting: Psychiatry

## 2020-08-19 ENCOUNTER — Encounter: Payer: Self-pay | Admitting: Psychiatry

## 2020-08-19 ENCOUNTER — Other Ambulatory Visit: Payer: Self-pay

## 2020-08-19 VITALS — BP 111/67 | HR 85

## 2020-08-19 DIAGNOSIS — F4310 Post-traumatic stress disorder, unspecified: Secondary | ICD-10-CM | POA: Diagnosis not present

## 2020-08-19 DIAGNOSIS — F422 Mixed obsessional thoughts and acts: Secondary | ICD-10-CM | POA: Diagnosis not present

## 2020-08-19 DIAGNOSIS — F331 Major depressive disorder, recurrent, moderate: Secondary | ICD-10-CM

## 2020-08-19 DIAGNOSIS — F902 Attention-deficit hyperactivity disorder, combined type: Secondary | ICD-10-CM

## 2020-08-19 DIAGNOSIS — F5105 Insomnia due to other mental disorder: Secondary | ICD-10-CM | POA: Diagnosis not present

## 2020-08-19 DIAGNOSIS — F4001 Agoraphobia with panic disorder: Secondary | ICD-10-CM

## 2020-08-19 DIAGNOSIS — F411 Generalized anxiety disorder: Secondary | ICD-10-CM

## 2020-08-19 MED ORDER — METHYLPHENIDATE HCL 10 MG PO TABS: 10.0000 mg | ORAL_TABLET | Freq: Every day | ORAL | 0 refills | Status: DC

## 2020-08-19 MED ORDER — TRAZODONE HCL 50 MG PO TABS: ORAL_TABLET | ORAL | 1 refills | Status: DC

## 2020-08-19 MED ORDER — LORAZEPAM 1 MG PO TABS: 1.0000 mg | ORAL_TABLET | Freq: Three times a day (TID) | ORAL | 1 refills | Status: DC | PRN

## 2020-08-19 MED ORDER — FLUVOXAMINE MALEATE 50 MG PO TABS: 50.0000 mg | ORAL_TABLET | Freq: Two times a day (BID) | ORAL | 1 refills | Status: DC

## 2020-08-19 NOTE — Progress Notes (Signed)
Danielle Bruce [id number] 05/30/1960 60 y.o.   Subjective:   Patient ID:  Danielle Bruce is a 60 y.o. (DOB [birth year]) female.  Chief Complaint:  Chief Complaint  Patient presents with   Follow-up   Major depressive disorder, recurrent episode, moderate (HCM   Anxiety   ADHD   Panic Attack   Stress    grief    HPI Danielle Bruce presents for follow-up of multiple dxes.  visit in April and taken off lamotrigine DT possible skin reaction.  Had appt with [facility] dermatology and they assume that skin reaction with itching was related to lamotrigine. 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 Per oncology most likely drug is lamotrigine. And she weaned off of the medication to determine if the cutaneous lesions are related. Lesions seem less without lamotrigine.  At this point skin reaction is presumed related to lamotrigine.  No other disease sx. She is well aware that she has been on lamotrigine for many years with good results for depression control. There is a risk of relapse as she comes off of the medication and she is worried about that. Smart phone helps her cognitive  And other productivity.    seen October 2020.  Restarted low dosage ADD bc concerns over STM, concentration and productivity.  Ritalin 5 mg twice daily.  Also suggested she retry N-acetylcysteine for mild cognitive complaints.  Last seen April 07, 2019 the following was noted: Mostly good.  Anxiety is mostly better, but depression is a little worse seasonally.  Not able to go help her daughter.   Low energy but does have motivation.  Ritalin helps energy and focus but doesn't take it regularly bc irregular sleep cycle.   Ativan only when having medical issues.    Therapy working on trauma issues is very hard.  Struggling with the fact she doesn't have memory of large parts of her past.   No  manic sx off mood stabilizers.  Covid isolation reduce stressors overall and not prominently depressed.   No meds were changed.  June 25, 2019 appointment the following is noted: F real worried about Covid despite vaccination.   Started fluvoxamine 25 on May 14, 2019 from PCP mainly for inflammation but possibly thinking she might be depressed. Seen benefit for energy and sleep pattern and more appropriate feelings and less numb.  Now realizes she had been depressed for awhile but just numb with depression.  Anniversaries of deaths of friends including last BF Danielle Bruce a little over a year ago.   Thinks of him daily. His 58 yo D died of unknown cause recently.  Stress D [place] to beach. Wonders about increasing the fluvoxamine  [name] living in [place] and hasn't seen him in a year. [name] on disability for depression. Patient reports more depression.  Plan: Realized lately she's depressed and started Luvox again for it and for the anti-inflammatory potential for her health.  Has seen some benefit.   Prior benefit at 25 mg daily.  Optiion increase but slightly bc med sensitive. AGREE increase fluvoxamine to 37.5 mg daily.  08/06/2019 appointment with the following noted: Increased fluvoxamine to 37.5 mg and it helped the depression. Could see a big difference and had been more depressed than she even realized. Outlook better and sleep pattern normalized.    More energy and motivation.   But also started having prolonged HA.  HA lasted a  couple of weeks.   Assumed HA was DT increased fluvoxamine so reduced to 25 mg on 07/28/19.  07/29/19 Episode of confusion occurred also.  Went to doctor while confused.  They couldn't determine cause.    Resolved in 1 day. FM flare up since fall/winter. To beach with daughter 3 mos minimum to give her shots for IVF treatment. Patient reports panic recently and recent difficulty with anxiety.    Occ flashbacks.   Patient denies difficulty with sleep initiation or  maintenance. Denies appetite disturbance.  Patient reports that energy and motivation have been good.  Patient denies any difficulty with concentration.  Patient denies any suicidal ideation. Plan: AGREE increase fluvoxamine to 37.5 mg daily once HA is controlled for several weekcs.  01/01/20 appt with the following noted: Increased fluvoxamine for awhile but got more HA and reduced it. Anxiety is better in general now.  But is interested in increasing it DT coming winter. When anxiety is not good it catches her off guard. Likely had Covid in August but early test was negative.  Got prednisone but sick for 5 weeks.  Lost taste and smell still going on.  D also had Covid.  Felt really sick and terrified with bad mental health at the time.  Was traumatic for her.  Had been vaccinated.   Had panic attack at the ER and took a long time to get the Ativan.  D moving back to area and pt will have to help with childcare so hopes to more aggressively treat her health problems. 2 gkids and 2 more coming. [name] married without kids yet. Plan: started Luvox again for it and for the anti-inflammatory potential for her health.  Has seen some benefit.   Prior benefit at 25 mg daily.  Optiion increase but slightly bc med sensitive. AGREE increase fluvoxamine to 37.5 mg daily once HA is controlled for several weeks.  03/03/2020 appointment with the following noted: On Day 10 of Covid.  2nd bout. A lot easier this time. Had gotten Luvox up to 50 mg daily and tolerated.  Then doubled it to 100 mg daily. Wonders about the proper dose for maintenance.   No taste and smell, exhausted, congestion and mild cough.  Energy is better than it was in the beginning.  Made her depressed and anxious again and that part is better also.  Last time had to go to ER with Covid.  Only ER visit in 2-3 years.  Less migraine than in the past. Overall was doing better with last couple months.  Trying to do anti-inflammatory diet.   Vegetarian, no dairy or gluten.  Natural sugars.   Still some anxiety. D moved back here and pregnant with 2nd set of twins. Current twins are 58 yo.   Will restart trauma therapy and it's helped over the last couple of years.  More freedom from things.      Less daily dread with anxiety but still some panic which can be triggered.  Less OCD with fluvoxamine unless triggered.   Taking Ritalin 10 mg each AM and tolerating and benefiting. occ NM with few dreams overall. Plan: started Luvox again for it and for the anti-inflammatory potential for her health.  Has seen some benefit.   Prior benefit at 25 mg daily.  Med sensitive. AGREE increase fluvoxamine to  50  mg daily. Up to 100 mg daily if desired.   04/22/19 appt with following noted: Mostly good and randomly psycho.  Really busy with [facility]  weeks pregnant with twins and twins 58 yo at home.  She's helping and her H is away. Mo older and slower.  Pt overworking.  When with [place] kids interfere with sleep.  Doesn't eat as well with [place] affects her FM and energy.   Recently exhausted and needed rest but couldn't go home DT D had to go to hospital.  Her H critical at times and she lost it emotionally over this. Almost suicidal reaction with weird intrusive thoughts in response to the criticism.   Got a break and it helped.   Rheum adjusted Lyrica to BID. Had accident fall with one of babies and was bleeding.  She became hysterical.   Upset now that she couldn't control her emotions at the time.  Couldn't get herself under control to deal with the crisis.    Overall doing ok but doesn't handle stressors well and lose it.   Plan: AGREE with increase fluvoxamine to  50  mg daily. Up to 100 mg daily if desired.  06/23/2020 appointment with the following noted: Increased fluvoxamine 25 AM and 50 mg PM and tolerated it.  Been on this dose awhile.  Going to [place] May 6-19 and son taking her.  Working for [employer] and been  successful. Also going to [place], [place].  Excited.   Occ overwhelmed. Starting therapy with Lonn Georgia and looking forward to it and dealing with trauma stuff. Nervous about doing the therapy and getting decompensated like what happened last time. Prednisone hellps mood and body. Not as depressed as in the past.  Sleep is reasonably good.  Appetite is normal.  Anxiety intermittent.  No suicidal thoughts Plan: No med changes  08/19/2020 appointment with the following noted: Not good.  Dog died 2 week ago and was very close to her for 16 years.  She had been a great comfort during losses and health problems.  Dog helped her through periods of SI. Exhausted. [name] in [place] and going in early July. Wonders how long this will last.  Also increased fluvoxamine and now out and having withdrawal. Disc difference between grief and depression. Gwyndolyn Saxon needs therapist who takes Medicaid.  Past psych meds: Lithium, carbamazepine, oxcarbazepine, topiramate, valproic acid, lamotrigine, gabapentin  Zyprexa, Seroquel, Latuda headaches, Geodon,  Risperidone, Stelazine,  Rexulti,  Wellbutrin with side effects,  sertraline irritability, duloxetine, fluvoxamine 100 with Covid, fluoxetine, Stopped desipramine DT skin issues which now resolved.  It helped GI px.  buspirone side effects,   N-acetylcysteine, pramipexole,  methylphenidate,  Adderall,   Ambien with amnesia, trazodone, Ativan  Review of Systems:  Review of Systems  Gastrointestinal:  Positive for abdominal pain. Negative for nausea.  Neurological:  Negative for tremors and weakness. Less HA with new meds.  Medications: I have reviewed the patient's current medications.  Current Outpatient Medications  Medication Sig Dispense Refill   albuterol (ACCUNEB) 0.63 MG/3ML nebulizer solution Take 1 ampule by nebulization every 6 (six) hours as needed for wheezing.     albuterol (PROVENTIL HFA;VENTOLIN HFA) 108 (90 Base) MCG/ACT inhaler Inhale 2 puffs  into the lungs every 6 (six) hours as needed for wheezing or shortness of breath.     azelastine (ASTELIN) 0.1 % nasal spray Place into both nostrils 2 (two) times daily. Use in each nostril as directed     BEPREVE 1.5 % SOLN Place 1 drop into both eyes daily.      Coenzyme Q10 (CO Q 10 PO) Take by mouth daily.     cyclobenzaprine (FLEXERIL) 10  MG tablet Take 10 mg by mouth at bedtime as needed.     diphenhydrAMINE (BENADRYL) 25 mg capsule Take 50 mg by mouth every 6 (six) hours as needed (HEADACHES).     EPINEPHrine 0.3 mg/0.3 mL IJ SOAJ injection as needed.     Erenumab-aooe 70 MG/ML SOAJ Inject into the skin every 28 (twenty-eight) days.     fluorouracil (EFUDEX) 5 % cream as needed.     fluticasone (FLONASE) 50 MCG/ACT nasal spray Place 2 sprays into both nostrils daily. 16 g 0   fluvoxaMINE (LUVOX) 50 MG tablet Take 1 tablet (50 mg total) by mouth 2 (two) times daily. 180 tablet 1   L-methylfolate Calcium 15 MG TABS TAKE 1 TABLET BY MOUTH DAILY 30 tablet 11   levocetirizine (XYZAL) 5 MG tablet Take 5 mg by mouth every evening.  5   LORazepam (ATIVAN) 2 MG/ML concentrated solution Take 0.5 mLs (1 mg total) by mouth every 8 (eight) hours as needed for anxiety (severe panic). 30 mL 1   methocarbamol (ROBAXIN) 500 MG tablet Take 1 tablet (500 mg total) by mouth daily as needed for muscle spasms. 30 tablet 1   montelukast (SINGULAIR) 10 MG tablet Take 10 mg by mouth at bedtime.     oxyCODONE-acetaminophen (PERCOCET/ROXICET) 5-325 MG per tablet Take 2 tablets by mouth daily as needed for moderate pain or severe pain (mighraine.).      polyethylene glycol (MIRALAX / GLYCOLAX) packet Take 17 g by mouth daily as needed for mild constipation.     PRAVASTATIN SODIUM PO Take 40 mg by mouth.      pregabalin (LYRICA) 50 MG capsule Take 100 mg by mouth at bedtime. 1 tab in am and 1 tab HS.     PROLIA 60 MG/ML SOSY injection BRING TO THE OFFICE FOR INJECTION ON 02/15/2018 AS DIRECTED ONCE EVERY 6 MONTHS      promethazine (PHENERGAN) 25 MG tablet Take 25 mg by mouth every 6 (six) hours as needed for nausea.     Rimegepant Sulfate (NURTEC PO) Take by mouth as needed.     TURMERIC PO Take by mouth daily.     VASCEPA 1 g CAPS TK 2 CS PO BID WC  3   VITAMIN D PO Take by mouth. Take 5058m daily     VOLTAREN 1 % GEL Apply 2 g topically 4 (four) times daily as needed (JOINT PAIN).   3   Ascorbic Acid (VITAMIN C) 100 MG tablet Take 100 mg by mouth daily. (Patient not taking: Reported on 08/19/2020)     dexlansoprazole (DEXILANT) 60 MG capsule as needed. (Patient not taking: Reported on 08/19/2020)     LORazepam (ATIVAN) 1 MG tablet Take 1 tablet (1 mg total) by mouth every 8 (eight) hours as needed for anxiety. (takes 2 mg when having a medical procedure.) 30 tablet 1   methylphenidate (RITALIN) 10 MG tablet Take 1 tablet (10 mg total) by mouth daily. 30 tablet 0   traZODone (DESYREL) 50 MG tablet TAKE 1 TABLET(50 MG) BY MOUTH AT BEDTIME 90 tablet 1   No current facility-administered medications for this visit.    Medication Side Effects: None  Allergies:  Allergies  Allergen Reactions   Sulfa Antibiotics Nausea And Vomiting    Causes pt to vomit blood   Gluten Meal Other (See Comments)    Sensitivity.    Lactose Intolerance (Gi) Nausea And Vomiting   Flagyl [Metronidazole Hcl] Nausea And Vomiting   Morphine And Related Other (See  Comments)    Reaction: Depression, emotional    Past Medical History:  Diagnosis Date   Anemia    Anxiety    Arthritis    osteoarthritis   ASCUS (atypical squamous cells of undetermined significance) on Pap smear 05/06/05   NEG HIGH RISK HPV--C&B BIOPSY BENIGN 12/2005   Asthma    Bipolar 2 disorder (HCC)    Cancer (HCC)    skin cancer - basal cell   Colon polyps    Complication of anesthesia    anxious afterwards, will get headaches    Constipation    Depression    Fibromyalgia 10/2013   GERD (gastroesophageal reflux disease)    Hearing loss on left     Heart murmur    never had any problems   Hemorrhoids    High cholesterol    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   cytology negative   IBS (irritable bowel syndrome)    Insomnia    Lymphocytic colitis    Lymphoma (HCC)    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   Bone marrow biopsy showes 8% plasma cells IgA Lambda   Migraines    Osteoarthritis    Osteopenia    Peripheral neuropathy    PTSD (post-traumatic stress disorder)     Family History  Problem Relation Age of Onset   Diabetes Father    Hypertension Father    Hyperlipidemia Father    Heart disease Maternal Grandfather    Heart disease Paternal Grandmother    Heart disease Paternal Grandfather    Depression Son    Depression Son    Anxiety disorder Son    Depression Daughter    Anxiety disorder Daughter    Asthma Daughter     Social History   Socioeconomic History   Marital status: Divorced    Spouse name: Not on file   Number of children: 3   Years of education: Not on file   Highest education level: Not on file  Occupational History   Not on file  Tobacco Use   Smoking status: Former    Pack years: 0.00    Types: Cigarettes    Quit date: 05/13/2014    Years since quitting: 6.2   Smokeless tobacco: Never   Tobacco comments:    social   Vaping Use   Vaping Use: Never used  Substance and Sexual Activity   Alcohol use: Yes    Alcohol/week: 0.0 standard drinks    Comment: rare   Drug use: Never   Sexual activity: Not Currently    Comment: -1st intercourse 60 yo-More than 5 partners  Other Topics Concern   Not on file  Social History Narrative   Not on file   Social Determinants of Health   Financial Resource Strain: Not on file  Food Insecurity: Not on file  Transportation Needs: Not on file  Physical Activity: Not on file  Stress: Not on file  Social Connections: Not on file  Intimate Partner Violence: Not on file    Past Medical History, Surgical history, Social  history, and Family history were reviewed and updated as appropriate.   Please see review of systems for further details on the patient's review from today.   Objective:   Physical Exam:  BP 111/67   Pulse 85   LMP 12/28/2010   Physical Exam Constitutional:      General: She is not in acute distress. Musculoskeletal:        General: No deformity.  Neurological:     Mental Status: She is alert and oriented to person, place, and time.     Coordination: Coordination normal.  Psychiatric:        Attention and Perception: Attention and perception normal. She does not perceive auditory or visual hallucinations.        Mood and Affect: Mood is depressed. Mood is not anxious. Affect is tearful. Affect is not labile, blunt, angry or inappropriate.        Speech: Speech normal.        Behavior: Behavior normal.        Thought Content: Thought content normal. Thought content is not paranoid or delusional. Thought content does not include homicidal or suicidal ideation. Thought content does not include homicidal or suicidal plan.        Cognition and Memory: Cognition and memory normal.        Judgment: Judgment normal.     Comments: Insight intact    Lab Review:     Component Value Date/Time   NA 142 08/01/2016 1713   NA 144 07/12/2015 1521   K 4.7 08/01/2016 1713   K 3.8 07/12/2015 1521   CL 107 08/01/2016 1713   CO2 23 08/01/2016 1713   CO2 27 07/12/2015 1521   GLUCOSE 95 08/01/2016 1713   GLUCOSE 82 07/12/2015 1521   BUN 14 08/01/2016 1713   BUN 10.3 07/12/2015 1521   CREATININE 0.87 08/01/2016 1713   CREATININE 0.8 07/12/2015 1521   CALCIUM 10.0 08/01/2016 1713   CALCIUM 9.9 07/12/2015 1521   PROT 7.4 08/01/2016 1713   PROT 7.4 07/12/2015 1521   ALBUMIN 4.8 08/01/2016 1713   ALBUMIN 4.4 07/12/2015 1521   AST 23 08/01/2016 1713   AST 22 07/12/2015 1521   ALT 26 08/01/2016 1713   ALT 31 07/12/2015 1521   ALKPHOS 55 08/01/2016 1713   ALKPHOS 54 07/12/2015 1521    BILITOT 0.7 08/01/2016 1713   BILITOT 0.40 07/12/2015 1521   GFRNONAA 76 08/01/2016 1713   GFRAA 88 08/01/2016 1713       Component Value Date/Time   WBC 5.9 08/01/2016 1713   RBC 4.71 08/01/2016 1713   HGB 14.6 08/01/2016 1713   HGB 14.1 07/12/2015 1521   HCT 43.1 08/01/2016 1713   HCT 41.5 07/12/2015 1521   PLT 254 08/01/2016 1713   PLT 248 07/12/2015 1521   MCV 91.5 08/01/2016 1713   MCV 92.2 07/12/2015 1521   MCH 31.0 08/01/2016 1713   MCHC 33.9 08/01/2016 1713   RDW 13.2 08/01/2016 1713   RDW 13.3 07/12/2015 1521   LYMPHSABS 2,006 08/01/2016 1713   LYMPHSABS 1.8 07/12/2015 1521   MONOABS 354 08/01/2016 1713   MONOABS 0.7 07/12/2015 1521   EOSABS 118 08/01/2016 1713   EOSABS 0.1 07/12/2015 1521   BASOSABS 0 08/01/2016 1713   BASOSABS 0.0 07/12/2015 1521    No results found for: POCLITH, LITHIUM   No results found for: PHENYTOIN, PHENOBARB, VALPROATE, CBMZ   .res Assessment: Plan:    Terrye was seen today for follow-up, major depressive disorder, recurrent episode, moderate (hcm, anxiety, adhd, panic attack and stress.  Diagnoses and all orders for this visit:  Generalized anxiety disorder -     fluvoxaMINE (LUVOX) 50 MG tablet; Take 1 tablet (50 mg total) by mouth 2 (two) times daily. -     LORazepam (ATIVAN) 1 MG tablet; Take 1 tablet (1 mg total) by mouth every 8 (eight) hours as needed for anxiety. (takes 2  mg when having a medical procedure.)  Major depressive disorder, recurrent episode, moderate (HCC) -     fluvoxaMINE (LUVOX) 50 MG tablet; Take 1 tablet (50 mg total) by mouth 2 (two) times daily. -     methylphenidate (RITALIN) 10 MG tablet; Take 1 tablet (10 mg total) by mouth daily.  PTSD (post-traumatic stress disorder) -     fluvoxaMINE (LUVOX) 50 MG tablet; Take 1 tablet (50 mg total) by mouth 2 (two) times daily.  Panic disorder with agoraphobia -     fluvoxaMINE (LUVOX) 50 MG tablet; Take 1 tablet (50 mg total) by mouth 2 (two) times daily. -      LORazepam (ATIVAN) 1 MG tablet; Take 1 tablet (1 mg total) by mouth every 8 (eight) hours as needed for anxiety. (takes 2 mg when having a medical procedure.)  Attention deficit hyperactivity disorder (ADHD), combined type -     methylphenidate (RITALIN) 10 MG tablet; Take 1 tablet (10 mg total) by mouth daily.  Mixed obsessional thoughts and acts -     fluvoxaMINE (LUVOX) 50 MG tablet; Take 1 tablet (50 mg total) by mouth 2 (two) times daily.  Insomnia due to mental condition -     traZODone (DESYREL) 50 MG tablet; TAKE 1 TABLET(50 MG) BY MOUTH AT BEDTIME  Last year more obvious PTSD obvious has called into doubt the bipolar dix bc she's remained mood stable off the lamotrigine.  Need more time to reevaluate this and disc risk mania and mood cycling.  Discussed continued risk of hypomanic symptoms.  She has had symptoms consistent with hypomania in the past but there is now some question as to whether they were PTSD related rather than truly bipolar 2.  Her son has had treatment resistant depression with possible bipolar disorder and her daughter has been diagnosed with bipolar disorder but also a history of borderline personality disorder.  So there is a family history of the spectrum.  started Luvox again for it and for the anti-inflammatory potential for her health.  Has seen some benefit.   Prior benefit Taking fluvoxamine 50 mg BID Clear benefit from fluvoxamine for mood quite dramatically until the loss of the dog. No manic sx. Disc anti-inflammatory effects of fluvoxamine at doses of 100 mg BID for Covid.  2 studies proving benefit so far. HA are better.   Grief work over loss of dog who was very close to her.  Walked her through grief.  She was every part of her life.  Some guilt feelings.  Disc therapeutic letter writing for grief. Stress management for mood sx. Health problems worsen mood episodically.   Disc stress son's mental health problems.  We discussed the short-term risks  associated with benzodiazepines including sedation and increased fall risk among others.  Discussed long-term side effect risk including dependence, potential withdrawal symptoms, and the potential eventual dose-related risk of dementia.  Keep this at a minimum given cognitive concerns.  She only uses it when triggered with anxiety usually medical.  She is not using it regularly.  Discussed potential benefits, risks, and side effects of stimulants with patient to include increased heart rate, palpitations, insomnia, increased anxiety, increased irritability, or decreased appetite.  Instructed patient to contact office if experiencing any significant tolerability issues. Benefit so continue Ritalin 5 BID or 10 mg AM for cognitive and ADD reasons and disc risk of palpitations.   Continue therapy.  She is working well on things.   No med changes  FU 3 mos  Lynder Parents, MD, DFAPA    Please see After Visit Summary for patient specific instructions.  Future Appointments  Date Time Provider [place]  08/24/2020 10:00 AM [name], LCSW [facility] None  08/24/2020 11:45 AM Danie Binder, PT [facility]  09/14/2020 11:45 AM Danie Binder, PT [facility]  09/15/2020 11:00 AM [name], LCSW [facility] None  09/22/2020  1:00 PM [name], LCSW [facility] None  10/05/2020 11:45 AM Danie Binder, PT [facility]  10/05/2020  2:00 PM [name], LCSW [facility] None  10/12/2020  2:00 PM [name], LCSW [facility] None  10/21/2020  2:00 PM [name], LCSW [facility] None  10/26/2020 11:45 AM Danie Binder, PT [facility]  10/26/2020  2:00 PM [name], LCSW [facility] None  01/26/2021 11:00 AM Bo Merino, MD [facility] None  08/10/2021  2:00 PM Princess Bruins, MD [facility] None    No orders of the defined types were placed in this encounter.     -------------------------------

## 2020-08-20 DIAGNOSIS — R079 Chest pain, unspecified: Secondary | ICD-10-CM | POA: Diagnosis not present

## 2020-08-20 DIAGNOSIS — D472 Monoclonal gammopathy: Secondary | ICD-10-CM | POA: Diagnosis not present

## 2020-08-20 DIAGNOSIS — M797 Fibromyalgia: Secondary | ICD-10-CM | POA: Diagnosis not present

## 2020-08-20 DIAGNOSIS — M810 Age-related osteoporosis without current pathological fracture: Secondary | ICD-10-CM | POA: Diagnosis not present

## 2020-08-20 DIAGNOSIS — F4321 Adjustment disorder with depressed mood: Secondary | ICD-10-CM | POA: Diagnosis not present

## 2020-08-20 DIAGNOSIS — R430 Anosmia: Secondary | ICD-10-CM | POA: Diagnosis not present

## 2020-08-20 DIAGNOSIS — B070 Plantar wart: Secondary | ICD-10-CM | POA: Diagnosis not present

## 2020-08-24 ENCOUNTER — Other Ambulatory Visit: Payer: Self-pay

## 2020-08-24 ENCOUNTER — Ambulatory Visit (INDEPENDENT_AMBULATORY_CARE_PROVIDER_SITE_OTHER): Payer: Medicare Other | Admitting: Addiction (Substance Use Disorder)

## 2020-08-24 ENCOUNTER — Ambulatory Visit: Payer: Medicare Other

## 2020-08-24 DIAGNOSIS — F422 Mixed obsessional thoughts and acts: Secondary | ICD-10-CM | POA: Diagnosis not present

## 2020-08-24 DIAGNOSIS — M6281 Muscle weakness (generalized): Secondary | ICD-10-CM

## 2020-08-24 DIAGNOSIS — M353 Polymyalgia rheumatica: Secondary | ICD-10-CM

## 2020-08-24 DIAGNOSIS — F3181 Bipolar II disorder: Secondary | ICD-10-CM

## 2020-08-24 DIAGNOSIS — R293 Abnormal posture: Secondary | ICD-10-CM | POA: Diagnosis not present

## 2020-08-24 DIAGNOSIS — R252 Cramp and spasm: Secondary | ICD-10-CM

## 2020-08-24 NOTE — Therapy (Signed)
[facility] [address], [place], [place], [address] Phone: [phone]   Fax:  [phone]  Physical Therapy Treatment  Patient Details  Name: Danielle Bruce MRN: [id number] Date of Birth: 1960/09/09 Referring Provider (PT): Mayra Neer, MD   Encounter Date: 08/24/2020   PT End of Session - 08/24/20 1229     Visit Number 24    Date for PT Re-Evaluation 10/27/20    Authorization Type Medicare/Medicaid- KX needed    PT Start Time 1148    PT Stop Time 1226    PT Time Calculation (min) 38 min    Activity Tolerance Patient tolerated treatment well    Behavior During Therapy [facility] for tasks assessed/performed             Past Medical History:  Diagnosis Date   Anemia    Anxiety    Arthritis    osteoarthritis   ASCUS (atypical squamous cells of undetermined significance) on Pap smear 05/06/05   NEG HIGH RISK HPV--C&B BIOPSY BENIGN 12/2005   Asthma    Bipolar 2 disorder ([place])    Cancer ([place])    skin cancer - basal cell   Colon polyps    Complication of anesthesia    anxious afterwards, will get headaches    Constipation    Depression    Fibromyalgia 10/2013   GERD (gastroesophageal reflux disease)    Hearing loss on left    Heart murmur    never had any problems   Hemorrhoids    High cholesterol    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   cytology negative   IBS (irritable bowel syndrome)    Insomnia    Lymphocytic colitis    Lymphoma ([place])    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   Bone marrow biopsy showes 8% plasma cells IgA Lambda   Migraines    Osteoarthritis    Osteopenia    Peripheral neuropathy    PTSD (post-traumatic stress disorder)     Past Surgical History:  Procedure Laterality Date   BONE MARROW BIOPSY Left 12/18/2013   Plasma cell dyscrasia 8% population of plasma cells   BREAST BIOPSY Right    benign stereo   CESAREAN SECTION  69,62,95    CHOLECYSTECTOMY N/A 10/16/2012   Procedure: LAPAROSCOPIC CHOLECYSTECTOMY;  Surgeon: Joyice Faster. Cornett, MD;  Location: [facility];  Service: General;  Laterality: N/A;   COLONOSCOPY     numerous times   DILATION AND CURETTAGE OF UTERUS     ESOPHAGOGASTRODUODENOSCOPY     HEMORRHOID SURGERY  1993   x3   IUD REMOVAL  02/2015   Mirena   LAPAROSCOPIC LYSIS OF ADHESIONS N/A 10/16/2012   Procedure: LAPAROSCOPIC LYSIS OF ADHESIONS;  Surgeon: Marcello Moores A. Cornett, MD;  [facility];  Service: General;  Laterality: N/A;   LAPAROSCOPY N/A 10/16/2012   Procedure: LAPAROSCOPY DIAGNOSTIC;  Surgeon: Joyice Faster. Cornett, MD;  [facility];  Service: General;  Laterality: N/A;   PELVIC LAPAROSCOPY     RADIOLOGY WITH ANESTHESIA N/A 12/16/2015   Procedure: MRI OF BRAIN WITH AND WITHOUT CONTRAST;  Surgeon: Medication Radiologist, MD;  Location: [place];  Service: Radiology;  Laterality: N/A;   SHOULDER SURGERY  2007/2008   SPINE SURGERY  2010   cervical    There were no vitals filed for this visit.   Subjective Assessment - 08/24/20 1151     Subjective I am still struggling.  I am in a lot of  pain still. I was very busy helping my daughter with my grandkids.  I am not sleeping well.  I had really good pain relief immediatly after dry needling.    Pertinent History goes by "Danielle Bruce" ; OA bilateral feet; fibromyalgia; off cycle of steriod medicine currenlty    Patient Stated Goals Pt wants pain management and be able to move more without taking medication    Currently in Pain? Yes    Pain Location Neck    Pain Orientation Left;Right    Pain Descriptors / Indicators Aching;Sore    Pain Type Chronic pain    Pain Onset More than a month ago    Pain Frequency Constant    Aggravating Factors  stress, depression    Pain Relieving Factors rest, stretching, pain patches                               [facility] Adult PT Treatment/Exercise - 08/24/20 0001       Manual Therapy   Manual Therapy  Soft tissue mobilization;Myofascial release    Myofascial Release bil upper traps, cervical paraspinals              Trigger Point Dry Needling - 08/24/20 0001     Consent Given? Yes    Muscles Treated Head and Neck Upper trapezius;Cervical multifidi    Muscles Treated Upper Quadrant Pectoralis major   Lt only   Upper Trapezius Response Twitch reponse elicited;Palpable increased muscle length    Oblique Capitus Response Twitch response elicited;Palpable increased muscle length    Cervical multifidi Response Twitch reponse elicited;Palpable increased muscle length    Pectoralis Major Response Twitch response elicited;Palpable increased muscle length                  PT Education - 08/24/20 1155     Education Details meditation for sleep, pec stretch to address rib/pec pain, supine on foam roll for decompression and pec stretch    Person(s) Educated Patient    Methods Explanation;Demonstration    Comprehension Verbalized understanding              PT Short Term Goals - 03/29/20 1030       PT SHORT TERM GOAL #1   Title be independent in initial HEP    Status Achieved               PT Long Term Goals - 08/04/20 1025       PT LONG TERM GOAL #1   Title be independent in advanced HEP    Time 12    Period Weeks    Status On-going    Target Date 10/27/20      PT LONG TERM GOAL #2   Title Pt will be able to walk at least 1 mile without increased pain so she can return to birding    Status Achieved      PT LONG TERM GOAL #3   Title report 75% less neck/UE pain throughout the day with normal functional activities    Baseline not as painful and can do more stuff- 50-75% better    Status On-going    Target Date 10/27/20      PT LONG TERM GOAL #4   Title Pt will be able to hold her grandchildren for at least 1 hour throughout the day in order to help her daughter with child care    Baseline I can hold them for an  hour throughout the day    Status  Achieved      PT LONG TERM GOAL #5   Title Pt will demonstrate at least hip strength of 4+/5 for  improved stability for walking and lifting her grandchildren    Baseline 4/5 hip strength on Lt side    Time 12    Period Weeks    Status On-going    Target Date 10/27/20      Additional Long Term Goals   Additional Long Term Goals Yes      PT LONG TERM GOAL #6   Title improve NDI to < or = to 30% disability    Baseline 46% disability    Time 12    Period Weeks    Status New    Target Date 10/27/20                   Plan - 08/24/20 1228     Clinical Impression Statement Pt continues to deal with grief after loss of her dog.  Pt with report of pectoralis and rib pain.  PT suggested supine on foam roll for decompression and stretching in addition to doorway stretch.  Dry needling to Lt pectoralis at costochondral junction and along rib and at lateral portion of pec at axilla.  Pt had good response to this.  Pt with significant trigger point in Lt upper traps and demonstrated improved mobility and less pain after manual.  Pt has been stretching and PT encouraged pt to continue along with other stress management and sleep hygiene strategies during this time of grief.  Set-back due to this but pt is better today than last week.    Pt will continue to benefit from skilled PT to address chronic pain, mobility and strength.    PT Frequency Biweekly    PT Duration 12 weeks    PT Treatment/Interventions ADLs/Self Care Home Management;Cryotherapy;Electrical Stimulation;Iontophoresis 9m/ml Dexamethasone;Moist Heat;Ultrasound;Neuromuscular re-education;Therapeutic exercise;Therapeutic activities;Patient/family education;Manual techniques;Dry needling;Passive range of motion;Taping    PT Next Visit Plan manual therapy and stress management as needed next session    PT Home Exercise Plan Access Code: [email or web address]    Consulted and Agree with Plan of Care Patient             Patient will  benefit from skilled therapeutic intervention in order to improve the following deficits and impairments:  Abnormal gait, Increased fascial restricitons, Difficulty walking, Decreased range of motion, Pain, Decreased strength, Postural dysfunction  Visit Diagnosis: Muscle weakness (generalized)  Polymyalgia rheumatica ([place]  Cramp and spasm  Abnormal posture     Problem List Patient Active Problem List   Diagnosis Date Noted   GAD (generalized anxiety disorder) 12/26/2017   OCD (obsessive compulsive disorder) 12/26/2017   PTSD (post-traumatic stress disorder) 12/26/2017   DDD (degenerative disc disease), cervical 07/14/2016   Primary osteoarthritis of both feet 07/14/2016   Primary osteoarthritis of both hands 07/14/2016   Other fatigue 07/14/2016   History of IBS 07/14/2016   Osteopenia of multiple sites 07/14/2016   Fibromyalgia 01/13/2016   MGUS (monoclonal gammopathy of unknown significance) 12/23/2013   Chronic cholecystitis without calculus 10/18/2012   Abdominal pain, unspecified site 10/18/2012   Nausea alone 10/18/2012   Unspecified constipation 10/18/2012   Depression 10/18/2012   Anxiety    ASCUS (atypical squamous cells of undetermined significance) on Pap smear    IUD    Hemorrhoids 11/29/2010   Abdominal pain, left upper quadrant 11/29/2010     KSigurd Sos PT 08/24/20  12:31 PM   [facility] [address], [place], [place], [address] Phone: [phone]   Fax:  [phone]  Name: Danielle Bruce MRN: [id number] Date of Birth: 27-Oct-1960

## 2020-08-24 NOTE — Progress Notes (Signed)
[facility] Counselor/Therapist Progress Note  Patient ID: Danielle Bruce, MRN: [id number],    Date: 08/24/2020  Time Spent: 51mins  Treatment Type: Individual Therapy  Reported Symptoms: heartbroken, anxious, uncontrollable crying spells, insomnia  Mental Status Exam:   Appearance:   Casual and Well Groomed     Behavior:  Sharing  Motor:  Restlestness and Mannerisms  Speech/Language:   Pressured  Affect:  Appropriate  Mood:  anxious, labile, and sad  Thought process:  flight of ideas  Thought content:    Rumination  Sensory/Perceptual disturbances:    Flashback  Orientation:  Oriented x4  Attention:  Good  Concentration:  Fair  Memory:  some holes  Fund of knowledge:   Fair  Insight:    Fair  Judgment:   Good  Impulse Control:  Good   Risk Assessment: Danger to Self:  No Self-injurious Behavior: No Danger to Others: No Duty to Warn:no Physical Aggression / Violence:No  Access to Firearms a concern: No  Gang Involvement:No   Subjective: Client very sad and tearful today at the start of session. Client reported her 15 year old favorite dog "Bettie Jul 21, 2022" passed away. Client reported the attachment to her dog and her [place] recovery. Client struggling with lability due to the loss of her dog. Client explained that it was her daughter's dog and she moved away and she became attached to her daughter's dog. Client expressed her inability to be separated from the dog during the unraveling of her [place] around 2007. Client showed good insight when she said: 2007 was during the time when she helped others deal with their trauma without acknowledging her own. Client reported getting secondary trauma at her past job in 2007 and it bringing up her old past childhood traumas. 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 Therapist explored the meaning of her helping others to heal and how she is currently starting to unravel her own  MH & traumas. Client reported her grief with losing her dog who was such a support for her own traumas & therapist used MI & Grief therapy to support the client as she processed her loss and Mindfulness to help her calm to be able to notice the love of her dog instead of only the feelings of loss. Therapist assessed for safety and stability and client denied SI/HI/AVH but a need to process more present day items first.   Interventions: Mindfulness Meditation, Motivational Interviewing, Narrative, and Grief Therapy  Diagnosis:   ICD-10-CM   1. Bipolar II disorder ([name])  F31.81     2. Mixed obsessional thoughts and acts  F42.2      Plan of Care for emotion regulation:  Client to return for weekly therapy with therapist [name], for outpatient therapy and see medication provider for support of mood management.  Client to engage in CBT: challenging negative internal ruminations and self-talk AEB expressing toxic thoughts and challenging them with truth.  Client to practice DBT distress tolerance skills (such as distress tolerance and emotion regulation skills and achieving wise mind) to build support for dealing with emotional outbursts or internal emotional collapse AEB: using TIP, learning to ride the wave of emotion/sensations, staying in the present, and increasing their belief that they can do hard things.  Client to utilize BSP (brainspotting) with therapist to help client identify and process triggers for their emotional dysregulation, with goal of reducing said SUDs by 33%  each session.  Client to ground their body if overwhelmed, flooded, or disassociating and not feeling safe using focused mindfulness, grounding, or meditation.  Client also to create more stability & structure AEB following goals to assist in helping stabilize their life domains, helping to calm their nervous system and to build healthy brain neuropathways. Client to prioritize self-care techniques and implement coping  strategies to reduce the use of behaviors that incur consequences to themselves or others around them. Client is to practice self-compassion AEB being gentle with themselves, utilizing self-care techniques daily or as needed when grieving something in the moment.  Client is to process grief/pain of her loss of her ex-boyfriend in a somatic body-felt sense way: ie using mindfulness, brainspotting, or trauma release as a method for releasing body pain/tension caused by grief.  [name], LCSW, LCAS, CCTP, CCS, BSP

## 2020-09-02 ENCOUNTER — Ambulatory Visit: Payer: Medicare Other | Admitting: Addiction (Substance Use Disorder)

## 2020-09-14 ENCOUNTER — Ambulatory Visit: Payer: Medicare Other | Attending: Family Medicine

## 2020-09-14 ENCOUNTER — Other Ambulatory Visit: Payer: Self-pay

## 2020-09-14 DIAGNOSIS — M353 Polymyalgia rheumatica: Secondary | ICD-10-CM | POA: Diagnosis not present

## 2020-09-14 DIAGNOSIS — M6281 Muscle weakness (generalized): Secondary | ICD-10-CM

## 2020-09-14 DIAGNOSIS — R252 Cramp and spasm: Secondary | ICD-10-CM

## 2020-09-14 DIAGNOSIS — R293 Abnormal posture: Secondary | ICD-10-CM | POA: Diagnosis not present

## 2020-09-14 NOTE — Therapy (Signed)
[facility] [address], [place], [place], [address] Phone: [phone]   Fax:  [phone]  Physical Therapy Treatment  Patient Details  Name: Danielle Bruce MRN: [id number] Date of Birth: [birth year] Referring Provider (PT): Mayra Neer, MD   Encounter Date: 09/14/2020   PT End of Session - 09/14/20 1233     Visit Number 25    Date for PT Re-Evaluation 10/27/20    Authorization Type Medicare/Medicaid- KX needed    PT Start Time 1150    PT Stop Time 1229    PT Time Calculation (min) 39 min    Activity Tolerance Patient tolerated treatment well    Behavior During Therapy [facility] for tasks assessed/performed             Past Medical History:  Diagnosis Date   Anemia    Anxiety    Arthritis    osteoarthritis   ASCUS (atypical squamous cells of undetermined significance) on Pap smear 05/06/05   NEG HIGH RISK HPV--C&B BIOPSY BENIGN 12/2005   Asthma    Bipolar 2 disorder ([place])    Cancer ([place])    skin cancer - basal cell   Colon polyps    Complication of anesthesia    anxious afterwards, will get headaches    Constipation    Depression    Fibromyalgia 10/2013   GERD (gastroesophageal reflux disease)    Hearing loss on left    Heart murmur    never had any problems   Hemorrhoids    High cholesterol    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   cytology negative   IBS (irritable bowel syndrome)    Insomnia    Lymphocytic colitis    Lymphoma ([place])    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   Bone marrow biopsy showes 8% plasma cells IgA Lambda   Migraines    Osteoarthritis    Osteopenia    Peripheral neuropathy    PTSD (post-traumatic stress disorder)     Past Surgical History:  Procedure Laterality Date   BONE MARROW BIOPSY Left 12/18/2013   Plasma cell dyscrasia 8% population of plasma cells   BREAST BIOPSY Right    benign stereo   CESAREAN SECTION  09,98,33    CHOLECYSTECTOMY N/A 10/16/2012   Procedure: LAPAROSCOPIC CHOLECYSTECTOMY;  Surgeon: Joyice Faster. Cornett, MD;  [facility];  Service: General;  Laterality: N/A;   COLONOSCOPY     numerous times   DILATION AND CURETTAGE OF UTERUS     ESOPHAGOGASTRODUODENOSCOPY     HEMORRHOID SURGERY  1993   x3   IUD REMOVAL  02/2015   Mirena   LAPAROSCOPIC LYSIS OF ADHESIONS N/A 10/16/2012   Procedure: LAPAROSCOPIC LYSIS OF ADHESIONS;  Surgeon: Marcello Moores A. Cornett, MD;  [facility];  Service: General;  Laterality: N/A;   LAPAROSCOPY N/A 10/16/2012   Procedure: LAPAROSCOPY DIAGNOSTIC;  Surgeon: Joyice Faster. Cornett, MD;  [facility];  Service: General;  Laterality: N/A;   PELVIC LAPAROSCOPY     RADIOLOGY WITH ANESTHESIA N/A 12/16/2015   Procedure: MRI OF BRAIN WITH AND WITHOUT CONTRAST;  Surgeon: Medication Radiologist, MD;  Location: [facility];  Service: Radiology;  Laterality: N/A;   SHOULDER SURGERY  2007/2008   SPINE SURGERY  2010   cervical    There were no vitals filed for this visit.   Subjective Assessment - 09/14/20 1155     Subjective I am really hurting.  I have been caring for grandchildren  and lifting/carrying.  I've been so busy that I haven't been doing my exercises.    Pertinent History goes by "Danielle Bruce" ; OA bilateral feet; fibromyalgia; off cycle of steriod medicine currenlty    Currently in Pain? Yes    Pain Score 5     Pain Location Neck    Pain Orientation Right;Left    Pain Descriptors / Indicators Aching;Sore    Pain Type Chronic pain    Pain Onset More than a month ago    Pain Frequency Constant    Aggravating Factors  stress, depression    Pain Relieving Factors rest, stretching, pain patches                               [facility] Adult PT Treatment/Exercise - 09/14/20 0001       Manual Therapy   Manual Therapy Soft tissue mobilization;Myofascial release    Myofascial Release bil upper traps, cervical paraspinals              Trigger Point  Dry Needling - 09/14/20 0001     Consent Given? Yes    Muscles Treated Head and Neck Upper trapezius;Cervical multifidi    Upper Trapezius Response Twitch reponse elicited;Palpable increased muscle length    Oblique Capitus Response Twitch response elicited;Palpable increased muscle length    Cervical multifidi Response Twitch reponse elicited;Palpable increased muscle length                  PT Education - 09/14/20 1233     Education Details discussion regarding progression of exercise and re-estabilishing a routine for this    Person(s) Educated Patient    Methods Explanation    Comprehension Verbalized understanding              PT Short Term Goals - 03/29/20 1030       PT SHORT TERM GOAL #1   Title be independent in initial HEP    Status Achieved               PT Long Term Goals - 09/14/20 1153       PT LONG TERM GOAL #1   Title be independent in advanced HEP    Status On-going      PT LONG TERM GOAL #2   Title Pt will be able to walk at least 1 mile without increased pain so she can return to birding    Status Achieved      PT LONG TERM GOAL #4   Title Pt will be able to hold her grandchildren for at least 1 hour throughout the day in order to help her daughter with child care    Baseline this varies, increased pain lately so not able to do    Status On-going      PT LONG TERM GOAL #6   Title improve NDI to < or = to 30% disability    Baseline 46% disability    Status On-going                   Plan - 09/14/20 1204     Clinical Impression Statement Pt arrived with widespread pain due to caring for grandchildren and helping her daughter.  Pt has not had time to exercise at home and will resume these now.  PT discussed how pt can make a more regimented routine to become more consistent.  Pt continues to deal with depression after her  dog died.  Session focused on discussion of activity modification to reduce pain levels and manual  therapy to address cervical tension.  Sessions has been spread out to allow for time between sessions for pt to manage on her own.  Tension and trigger points in neck are reduced overall and pt had good response to dry needling with reduced tension after.  Pt has good pain reduction after needling sessions in general and this makes pt more functional.  Pt will return in 3 weeks for re-check and manual as needed.    PT Frequency Biweekly    PT Duration 12 weeks    PT Treatment/Interventions ADLs/Self Care Home Management;Cryotherapy;Electrical Stimulation;Iontophoresis 69m/ml Dexamethasone;Moist Heat;Ultrasound;Neuromuscular re-education;Therapeutic exercise;Therapeutic activities;Patient/family education;Manual techniques;Dry needling;Passive range of motion;Taping    PT Next Visit Plan manual therapy and stress management as needed next session    PT Home Exercise Plan Access Code: [email or web address]    Recommended Other Services recert is signed    Consulted and Agree with Plan of Care Patient             Patient will benefit from skilled therapeutic intervention in order to improve the following deficits and impairments:  Abnormal gait, Increased fascial restricitons, Difficulty walking, Decreased range of motion, Pain, Decreased strength, Postural dysfunction  Visit Diagnosis: Polymyalgia rheumatica ([place]  Cramp and spasm  Muscle weakness (generalized)  Abnormal posture     Problem List Patient Active Problem List   Diagnosis Date Noted   GAD (generalized anxiety disorder) 12/26/2017   OCD (obsessive compulsive disorder) 12/26/2017   PTSD (post-traumatic stress disorder) 12/26/2017   DDD (degenerative disc disease), cervical 07/14/2016   Primary osteoarthritis of both feet 07/14/2016   Primary osteoarthritis of both hands 07/14/2016   Other fatigue 07/14/2016   History of IBS 07/14/2016   Osteopenia of multiple sites 07/14/2016   Fibromyalgia 01/13/2016   MGUS (monoclonal  gammopathy of unknown significance) 12/23/2013   Chronic cholecystitis without calculus 10/18/2012   Abdominal pain, unspecified site 10/18/2012   Nausea alone 10/18/2012   Unspecified constipation 10/18/2012   Depression 10/18/2012   Anxiety    ASCUS (atypical squamous cells of undetermined significance) on Pap smear    IUD    Hemorrhoids 11/29/2010   Abdominal pain, left upper quadrant 11/29/2010    KSigurd Sos PT 09/14/20 12:35 PM   [facility] [address] [place] [address]: [phone]  Fax:  [phone] Name: Danielle PENDERGRASSMRN: [id number] of Birth: 909-22-1962

## 2020-09-15 ENCOUNTER — Ambulatory Visit (INDEPENDENT_AMBULATORY_CARE_PROVIDER_SITE_OTHER): Payer: Medicare Other | Admitting: Addiction (Substance Use Disorder)

## 2020-09-15 DIAGNOSIS — F3181 Bipolar II disorder: Secondary | ICD-10-CM

## 2020-09-15 NOTE — Progress Notes (Signed)
[facility] Counselor/Therapist Progress Note  Patient ID: Danielle Bruce, MRN: [id number],    Date: 09/15/2020  Time Spent: 92mins  Treatment Type: Individual Therapy  Reported Symptoms: some irritation  Mental Status Exam:   Appearance:   Casual and Well Groomed     Behavior:  Sharing  Motor:  Restlestness and Mannerisms  Speech/Language:   Pressured  Affect:  Appropriate  Mood:  anxious and irritable  Thought process:  flight of ideas  Thought content:    Rumination  Sensory/Perceptual disturbances:    Flashback  Orientation:  Oriented x4  Attention:  Good  Concentration:  Fair  Memory:  some holes  Fund of knowledge:   Good  Insight:    Fair  Judgment:   Good  Impulse Control:  Good   Risk Assessment: Danger to Self:  No Self-injurious Behavior: No Danger to Others: No Duty to Warn:no Physical Aggression / Violence:No  Access to Firearms a concern: No  Gang Involvement:No   Subjective: Client reports some irritability with her family who is wanting a lot of attention from her. Client reports physical and emotional exhaustion from caring for her grandkids. Client reporting setting boundaries for what she can deal with as far as child caring for her daughter's kids. Client reported what actually what makes her uptight: events that trigger her anxiety. Client processed being nervous about going on a 5 mile bridge when going on vacation. Therapist used MI & Mindfulness to support client in processing her triggers and anxiety about the bridge and help her regulate emotionally and ground. Client processed history with her family who taught her about racism and equality and therapist used narrative therapy to help her process. Therapist assessed for safety and stability and client denied SI/HI/AVH but a need to process more present day items first.   Interventions: Mindfulness Meditation, Motivational Interviewing, and Narrative  Diagnosis:   ICD-10-CM   1. Bipolar II  disorder ([place])  F31.81       Plan of Care for emotion regulation:  Client to return for weekly therapy with therapist [name], for outpatient therapy and see medication provider for support of mood management.  Client to engage in CBT: challenging negative internal ruminations and self-talk AEB expressing toxic thoughts and challenging them with truth.  Client to practice DBT distress tolerance skills (such as distress tolerance and emotion regulation skills and achieving wise mind) to build support for dealing with emotional outbursts or internal emotional collapse AEB: using TIP, learning to ride the wave of emotion/sensations, staying in the present, and increasing their belief that they can do hard things.  Client to utilize BSP (brainspotting) with therapist to help client identify and process triggers for their emotional dysregulation, with goal of reducing said SUDs by 33% each session.  Client to ground their body if overwhelmed, flooded, or disassociating and not feeling safe using focused mindfulness, grounding, or meditation.  Client also to create more stability & structure AEB following goals to assist in helping stabilize their life domains, helping to calm their nervous system and to build healthy brain neuropathways. Client to prioritize self-care techniques and implement coping strategies to reduce the use of behaviors that incur consequences to themselves or others around them. Client is to practice self-compassion AEB being gentle with themselves, utilizing self-care techniques daily or as needed when grieving something in the moment.  Client is to process grief/pain of her loss of her ex-boyfriend in a somatic body-felt sense way: ie using mindfulness,  brainspotting, or trauma release as a method for releasing body pain/tension caused by grief.  [name], LCSW, LCAS, CCTP, CCS, BSP

## 2020-09-16 DIAGNOSIS — M25561 Pain in right knee: Secondary | ICD-10-CM | POA: Diagnosis not present

## 2020-09-16 DIAGNOSIS — K589 Irritable bowel syndrome without diarrhea: Secondary | ICD-10-CM | POA: Diagnosis not present

## 2020-09-16 DIAGNOSIS — D472 Monoclonal gammopathy: Secondary | ICD-10-CM | POA: Diagnosis not present

## 2020-09-16 DIAGNOSIS — M797 Fibromyalgia: Secondary | ICD-10-CM | POA: Diagnosis not present

## 2020-09-16 DIAGNOSIS — R430 Anosmia: Secondary | ICD-10-CM | POA: Diagnosis not present

## 2020-09-16 DIAGNOSIS — F411 Generalized anxiety disorder: Secondary | ICD-10-CM | POA: Diagnosis not present

## 2020-09-16 DIAGNOSIS — F4321 Adjustment disorder with depressed mood: Secondary | ICD-10-CM | POA: Diagnosis not present

## 2020-09-17 ENCOUNTER — Other Ambulatory Visit: Payer: Self-pay | Admitting: Gastroenterology

## 2020-09-17 ENCOUNTER — Ambulatory Visit
Admission: RE | Admit: 2020-09-17 | Discharge: 2020-09-17 | Disposition: A | Discharge status: Home / Self Care | Payer: Medicare Other | Source: Ambulatory Visit | Attending: Gastroenterology | Admitting: Gastroenterology

## 2020-09-17 DIAGNOSIS — K5900 Constipation, unspecified: Secondary | ICD-10-CM | POA: Diagnosis not present

## 2020-09-17 DIAGNOSIS — R1030 Lower abdominal pain, unspecified: Secondary | ICD-10-CM | POA: Diagnosis not present

## 2020-09-17 DIAGNOSIS — R109 Unspecified abdominal pain: Secondary | ICD-10-CM | POA: Diagnosis not present

## 2020-09-17 DIAGNOSIS — M810 Age-related osteoporosis without current pathological fracture: Secondary | ICD-10-CM | POA: Diagnosis not present

## 2020-09-17 DIAGNOSIS — K649 Unspecified hemorrhoids: Secondary | ICD-10-CM | POA: Diagnosis not present

## 2020-09-17 DIAGNOSIS — R140 Abdominal distension (gaseous): Secondary | ICD-10-CM | POA: Diagnosis not present

## 2020-09-21 DIAGNOSIS — M25561 Pain in right knee: Secondary | ICD-10-CM | POA: Diagnosis not present

## 2020-09-22 ENCOUNTER — Ambulatory Visit (INDEPENDENT_AMBULATORY_CARE_PROVIDER_SITE_OTHER): Payer: Medicare Other | Admitting: Addiction (Substance Use Disorder)

## 2020-09-22 ENCOUNTER — Other Ambulatory Visit: Payer: Self-pay

## 2020-09-22 DIAGNOSIS — F4310 Post-traumatic stress disorder, unspecified: Secondary | ICD-10-CM
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
## 2020-09-24 DIAGNOSIS — Z20822 Contact with and (suspected) exposure to covid-19: Secondary | ICD-10-CM | POA: Diagnosis not present

## 2020-10-05 ENCOUNTER — Ambulatory Visit: Payer: Medicare Other | Attending: Family Medicine

## 2020-10-05 ENCOUNTER — Other Ambulatory Visit: Payer: Self-pay

## 2020-10-05 ENCOUNTER — Ambulatory Visit (INDEPENDENT_AMBULATORY_CARE_PROVIDER_SITE_OTHER): Payer: Medicare Other | Admitting: Addiction (Substance Use Disorder)

## 2020-10-05 DIAGNOSIS — R252 Cramp and spasm: Secondary | ICD-10-CM | POA: Insufficient documentation

## 2020-10-05 DIAGNOSIS — F3181 Bipolar II disorder: Secondary | ICD-10-CM

## 2020-10-05 DIAGNOSIS — R293 Abnormal posture: Secondary | ICD-10-CM | POA: Insufficient documentation

## 2020-10-05 DIAGNOSIS — M6281 Muscle weakness (generalized): Secondary | ICD-10-CM | POA: Diagnosis not present

## 2020-10-05 DIAGNOSIS — M353 Polymyalgia rheumatica: Secondary | ICD-10-CM | POA: Insufficient documentation

## 2020-10-05 DIAGNOSIS — F4310 Post-traumatic stress disorder, unspecified: Secondary | ICD-10-CM | POA: Diagnosis not present

## 2020-10-05 NOTE — Therapy (Signed)
[facility] [address], [place], [place], [address] Phone: [phone]   Fax:  [phone]  Physical Therapy Treatment  Patient Details  Name: Danielle Bruce MRN: [id number] Date of Birth: 06-11-1960 Referring Provider (PT): Mayra Neer, MD   Encounter Date: 10/05/2020   PT End of Session - 10/05/20 1227     Visit Number 26    Date for PT Re-Evaluation 10/27/20    Authorization Type Medicare/Medicaid- KX needed    PT Start Time 6962    PT Stop Time 1230    PT Time Calculation (min) 35 min    Activity Tolerance Patient tolerated treatment well    Behavior During Therapy [facility] for tasks assessed/performed             Past Medical History:  Diagnosis Date   Anemia    Anxiety    Arthritis    osteoarthritis   ASCUS (atypical squamous cells of undetermined significance) on Pap smear 05/06/05   NEG HIGH RISK HPV--C&B BIOPSY BENIGN 12/2005   Asthma    Bipolar 2 disorder ([place])    Cancer ([place])    skin cancer - basal cell   Colon polyps    Complication of anesthesia    anxious afterwards, will get headaches    Constipation    Depression    Fibromyalgia 10/2013   GERD (gastroesophageal reflux disease)    Hearing loss on left    Heart murmur    never had any problems   Hemorrhoids    High cholesterol    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   cytology negative   IBS (irritable bowel syndrome)    Insomnia    Lymphocytic colitis    Lymphoma ([place])    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   Bone marrow biopsy showes 8% plasma cells IgA Lambda   Migraines    Osteoarthritis    Osteopenia    Peripheral neuropathy    PTSD (post-traumatic stress disorder)     Past Surgical History:  Procedure Laterality Date   BONE MARROW BIOPSY Left 12/18/2013   Plasma cell dyscrasia 8% population of plasma cells   BREAST BIOPSY Right    benign stereo   CESAREAN SECTION  95,28,41    CHOLECYSTECTOMY N/A 10/16/2012   Procedure: LAPAROSCOPIC CHOLECYSTECTOMY;  Surgeon: Joyice Faster. Cornett, MD;  Location: [facility];  Service: General;  Laterality: N/A;   COLONOSCOPY     numerous times   DILATION AND CURETTAGE OF UTERUS     ESOPHAGOGASTRODUODENOSCOPY     HEMORRHOID SURGERY  1993   x3   IUD REMOVAL  02/2015   Mirena   LAPAROSCOPIC LYSIS OF ADHESIONS N/A 10/16/2012   Procedure: LAPAROSCOPIC LYSIS OF ADHESIONS;  Surgeon: Marcello Moores A. Cornett, MD;  Location: [facility];  Service: General;  Laterality: N/A;   LAPAROSCOPY N/A 10/16/2012   Procedure: LAPAROSCOPY DIAGNOSTIC;  Surgeon: Joyice Faster. Cornett, MD;  Location: [facility];  Service: General;  Laterality: N/A;   PELVIC LAPAROSCOPY     RADIOLOGY WITH ANESTHESIA N/A 12/16/2015   Procedure: MRI OF BRAIN WITH AND WITHOUT CONTRAST;  Surgeon: Medication Radiologist, MD;  Location: [place];  Service: Radiology;  Laterality: N/A;   SHOULDER SURGERY  2007/2008   SPINE SURGERY  2010   cervical    There were no vitals filed for this visit.   Subjective Assessment - 10/05/20 1155     Subjective I am exhausted and sore after being at the beach last  week.  I really need to get back to the gym but Dr Brigitte Pulse doesn't want me to go yet.  I was swimming at the beach and it felt good.    Pertinent History goes by "Danielle Bruce" ; OA bilateral feet; fibromyalgia; off cycle of steriod medicine currenlty    Patient Stated Goals Pt wants pain management and be able to move more without taking medication    Currently in Pain? Yes    Pain Score 3     Pain Location Back    Pain Orientation Left;Right;Lower    Pain Descriptors / Indicators Aching;Sore    Pain Type Chronic pain    Pain Onset More than a month ago    Pain Frequency Constant    Aggravating Factors  being really active    Pain Relieving Factors rest, stetching, pain patches                               [facility] Adult PT Treatment/Exercise - 10/05/20 0001       Manual Therapy    Manual Therapy Soft tissue mobilization;Myofascial release    Myofascial Release lumbar paraspinals, gluteals and quadratus              Trigger Point Dry Needling - 10/05/20 0001     Consent Given? Yes    Muscles Treated Back/Hip Gluteus minimus;Gluteus medius;Lumbar multifidi    Gluteus Minimus Response Twitch response elicited;Palpable increased muscle length    Gluteus Medius Response Twitch response elicited;Palpable increased muscle length    Lumbar multifidi Response Twitch response elicited;Palpable increased muscle length                    PT Short Term Goals - 03/29/20 1030       PT SHORT TERM GOAL #1   Title be independent in initial HEP    Status Achieved               PT Long Term Goals - 09/14/20 1153       PT LONG TERM GOAL #1   Title be independent in advanced HEP    Status On-going      PT LONG TERM GOAL #2   Title Pt will be able to walk at least 1 mile without increased pain so she can return to birding    Status Achieved      PT LONG TERM GOAL #4   Title Pt will be able to hold her grandchildren for at least 1 hour throughout the day in order to help her daughter with child care    Baseline this varies, increased pain lately so not able to do    Status On-going      PT LONG TERM GOAL #6   Title improve NDI to < or = to 30% disability    Baseline 46% disability    Status On-going                   Plan - 10/05/20 1233     Clinical Impression Statement Sessions has been spread out to allow for time between sessions for pt to manage on her own and pt has been doing well with this.  Pt was at the beach with her family and has increased lumbar tension and soreness due to walking up and down steps and carrying her grandchildren.  Overall, pt did well on this trip.   Tension and trigger points  in low back and gluteals and reduced overall and pt had good response to dry needling with reduced tension after.  Pt has good pain  reduction after needling sessions in general and this makes pt more functional. PT and pt discussed becoming consistent with pool exercises and pt is going to look into gym membership.  Pt will return in 3 weeks for re-check and manual as needed.    PT Frequency Biweekly    PT Duration 12 weeks    PT Treatment/Interventions ADLs/Self Care Home Management;Cryotherapy;Electrical Stimulation;Iontophoresis 72m/ml Dexamethasone;Moist Heat;Ultrasound;Neuromuscular re-education;Therapeutic exercise;Therapeutic activities;Patient/family education;Manual techniques;Dry needling;Passive range of motion;Taping    PT Next Visit Plan manual therapy and stress management as needed next session    PT Home Exercise Plan Access Code: [email or web address]    Consulted and Agree with Plan of Care Patient             Patient will benefit from skilled therapeutic intervention in order to improve the following deficits and impairments:  Abnormal gait, Increased fascial restricitons, Difficulty walking, Decreased range of motion, Pain, Decreased strength, Postural dysfunction  Visit Diagnosis: Polymyalgia rheumatica ([place]  Cramp and spasm  Muscle weakness (generalized)  Abnormal posture     Problem List Patient Active Problem List   Diagnosis Date Noted   GAD (generalized anxiety disorder) 12/26/2017   OCD (obsessive compulsive disorder) 12/26/2017   PTSD (post-traumatic stress disorder) 12/26/2017   DDD (degenerative disc disease), cervical 07/14/2016   Primary osteoarthritis of both feet 07/14/2016   Primary osteoarthritis of both hands 07/14/2016   Other fatigue 07/14/2016   History of IBS 07/14/2016   Osteopenia of multiple sites 07/14/2016   Fibromyalgia 01/13/2016   MGUS (monoclonal gammopathy of unknown significance) 12/23/2013   Chronic cholecystitis without calculus 10/18/2012   Abdominal pain, unspecified site 10/18/2012   Nausea alone 10/18/2012   Unspecified constipation 10/18/2012    Depression 10/18/2012   Anxiety    ASCUS (atypical squamous cells of undetermined significance) on Pap smear    IUD    Hemorrhoids 11/29/2010   Abdominal pain, left upper quadrant 11/29/2010    KSigurd Sos PT 10/05/20 12:35 PM    [facility] [address] [place] [address]Phone: [phone]  Fax:  [phone] Name: CCAMBRIE SONNENFELDMRN: [id number] of Birth: 9October 31, 1962

## 2020-10-05 NOTE — Progress Notes (Signed)
[facility] Counselor/Therapist Progress Note  Patient ID: Danielle Bruce, MRN: [id number],    Date: 10/05/2020  Time Spent: 64mns  Treatment Type: Individual Therapy  Reported Symptoms: insecure, low distress tolerance  Mental Status Exam:   Appearance:   Casual and Well Groomed     Behavior:  Sharing  Motor:  Restlestness and Mannerisms  Speech/Language:   Pressured  Affect:  Appropriate and Full Range  Mood:  anxious, labile, and sad  Thought process:  flight of ideas  Thought content:    Rumination  Sensory/Perceptual disturbances:    Flashback  Orientation:  Oriented x4  Attention:  Good  Concentration:  Fair  Memory:  [place] knowledge:   Good  Insight:    Fair  Judgment:   Good  Impulse Control:  Good   Risk Assessment: Danger to Self:  No Self-injurious Behavior: No Danger to Others: No Duty to Warn:no Physical Aggression / Violence:No  Access to Firearms a concern: No  Gang Involvement:No   Subjective: Client reported feeling a lot of insecurity and not feeling sure of how she can be "of worth" in this world. 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 Client feeling discouraged that she cant do that now that she is on disability and has MH issues and feels like a burden with her family who bring her everywhere and support her. Client discussed difficulty making that change and her emotions surrounding it all. Therapist used MI, CBT, and grief therapy to validate her feelings, help her process, and empathize with her and her painful experience of  as she says is "losing this part of her".Client also discussed her need for more distress tolerance and discussed her plan to go through a DBT book to help with that. Therapist assessed for safety and stability and client denied SI/HI/AVH.   Interventions: Cognitive Behavioral Therapy, Motivational Interviewing, and  Grief Therapy  Diagnosis:   ICD-10-CM   1. PTSD (post-traumatic stress disorder)  F43.10     2. Bipolar II disorder ([place]  F31.81       Plan of Care for emotion regulation:  Client to return for weekly therapy with therapist KSammuel Cooper for outpatient therapy and see medication provider for support of mood management.  Client to engage in CBT: challenging negative internal ruminations and self-talk AEB expressing toxic thoughts and challenging them with truth.  Client to practice DBT distress tolerance skills (such as distress tolerance and emotion regulation skills and achieving wise mind) to build support for dealing with emotional outbursts or internal emotional collapse AEB: using TIP, learning to ride the wave of emotion/sensations, staying in the present, and increasing their belief that they can do hard things.  Client to utilize BSP (brainspotting) with therapist to help client identify and process triggers for their emotional dysregulation, with goal of reducing said SUDs by 33% each session.  Client to ground their body if overwhelmed, flooded, or disassociating and not feeling safe using focused mindfulness, grounding, or meditation.  Client also to create more stability & structure AEB following goals to assist in helping stabilize their life domains, helping to calm their nervous system and to build healthy brain neuropathways. Client to prioritize self-care techniques and implement coping strategies to reduce the use of behaviors that incur consequences to themselves or others around them. Client is to practice self-compassion AEB being gentle with themselves,  utilizing self-care techniques daily or as needed when grieving something in the moment.  Client is to process grief/pain of her loss of her ex-boyfriend in a somatic body-felt sense way: ie using mindfulness, brainspotting, or trauma release as a method for releasing body pain/tension caused by grief.  [name], LCSW,  LCAS, CCTP, CCS, BSP

## 2020-10-12 ENCOUNTER — Other Ambulatory Visit: Payer: Self-pay

## 2020-10-12 ENCOUNTER — Ambulatory Visit (INDEPENDENT_AMBULATORY_CARE_PROVIDER_SITE_OTHER): Payer: Medicare Other | Admitting: Addiction (Substance Use Disorder)

## 2020-10-12 DIAGNOSIS — F331 Major depressive disorder, recurrent, moderate: Secondary | ICD-10-CM | POA: Diagnosis not present

## 2020-10-12 NOTE — Progress Notes (Signed)
[facility] Counselor/Therapist Progress Note  Patient ID: Danielle Bruce, MRN: [id number],    Date: 10/12/2020  Time Spent: 65mns  Treatment Type: Individual Therapy  Reported Symptoms: fatigued & grieving.   Mental Status Exam:   Appearance:   Casual and Well Groomed     Behavior:  Sharing  Motor:  Normal  Speech/Language:   Clear and Coherent and Normal Rate  Affect:  Appropriate and Full Range  Mood:  sad  Thought process:  normal  Thought content:    Rumination  Sensory/Perceptual disturbances:    Flashback  Orientation:  Oriented x4  Attention:  Good  Concentration:  Fair  Memory:  [place] knowledge:   Good  Insight:    Good  Judgment:   Good  Impulse Control:  Good   Risk Assessment: Danger to Self:  No Self-injurious Behavior: No Danger to Others: No Duty to Warn:no Physical Aggression / Violence:No  Access to Firearms a concern: No  Gang Involvement:No   Subjective: Client reported feeling like she is getting worse again: with fatigue and low motivation. Client reported feeling like "her body is a prison" because she doesn't have any energy. Client denied depression as a cause and  denied anhedonia. Client processed the grief of continuing to miss her dog and the pain of the loss. Therapist used MI, CBT & Grief Therapy with client to validate her feelings and grief and help her process the thoughts that steal positive memories with her dog. Client reported the fatigue set in when she was going to the beach after some of her GI issues began to flare up again. Client discussed the frustration with not knowing a trigger that she can focus on to give her relief. Therapist used SFT with client to help her find small ways to find relief. Therapist assessed for safety and stability and client denied SI/HI/AVH.   Interventions: Cognitive Behavioral Therapy, Motivational Interviewing, Solution-Oriented/Positive Psychology, and Grief Therapy  Diagnosis:    ICD-10-CM   1. Major depressive disorder, recurrent episode, moderate ([place]  F33.1       Plan of Care for emotion regulation:  Client to return for weekly therapy with therapist KSammuel Cooper for outpatient therapy and see medication provider for support of mood management.  Client to engage in CBT: challenging negative internal ruminations and self-talk AEB expressing toxic thoughts and challenging them with truth.  Client to practice DBT distress tolerance skills (such as distress tolerance and emotion regulation skills and achieving wise mind) to build support for dealing with emotional outbursts or internal emotional collapse AEB: using TIP, learning to ride the wave of emotion/sensations, staying in the present, and increasing their belief that they can do hard things.  Client to utilize BSP (brainspotting) with therapist to help client identify and process triggers for their emotional dysregulation, with goal of reducing said SUDs by 33% each session.  Client to ground their body if overwhelmed, flooded, or disassociating and not feeling safe using focused mindfulness, grounding, or meditation.  Client also to create more stability & structure AEB following goals to assist in helping stabilize their life domains, helping to calm their nervous system and to build healthy brain neuropathways. Client to prioritize self-care techniques and implement coping strategies to reduce the use of behaviors that incur consequences to themselves or others around them. Client is to practice self-compassion AEB being gentle with themselves, utilizing self-care techniques daily or as needed when grieving something in the moment.  Client  is to process grief/pain of her loss of her ex-boyfriend in a somatic body-felt sense way: ie using mindfulness, brainspotting, or trauma release as a method for releasing body pain/tension caused by grief.  [name], LCSW, LCAS, CCTP, CCS,  BSP

## 2020-10-21 ENCOUNTER — Ambulatory Visit (INDEPENDENT_AMBULATORY_CARE_PROVIDER_SITE_OTHER): Payer: Medicare Other | Admitting: Addiction (Substance Use Disorder)

## 2020-10-21 ENCOUNTER — Other Ambulatory Visit: Payer: Self-pay

## 2020-10-21 DIAGNOSIS — F428 Other obsessive-compulsive disorder: Secondary | ICD-10-CM

## 2020-10-21 DIAGNOSIS — F331 Major depressive disorder, recurrent, moderate: Secondary | ICD-10-CM | POA: Diagnosis not present

## 2020-10-21 NOTE — Progress Notes (Signed)
[facility] Counselor/Therapist Progress Note  Patient ID: Danielle Bruce, MRN: [id number],    Date: 10/21/2020  Time Spent: 48mns  Treatment Type: Individual Therapy  Reported Symptoms: feeling guilty and obsessing  Mental Status Exam:   Appearance:   Casual and Well Groomed     Behavior:  Sharing  Motor:  Normal  Speech/Language:   Clear and Coherent and Normal Rate  Affect:  Appropriate and Full Range  Mood:  anxious, labile, and sad  Thought process:  normal  Thought content:    Obsessions and Rumination  Sensory/Perceptual disturbances:    Flashback  Orientation:  Oriented x4  Attention:  Good  Concentration:  Fair  Memory:  FFowlerof knowledge:   Good  Insight:    Good  Judgment:   Fair  Impulse Control:  Good   Risk Assessment: Danger to Self:  No Self-injurious Behavior: No Danger to Others: No Duty to Warn:no Physical Aggression / Violence:No  Access to Firearms a concern: No  Gang Involvement:No   Subjective: Client reported feeling guilty about not being able to take care of her grandkids and commit to multiple days a week due to her energy levels and health conditions. Client processed more about the complications of her physical and mental conditions over the last couple weeks and her challenge with trying to re-stabilize. Client feeling a sense of responsibility to help her daughter not "drown" with 2 sets of twins and dealing with obsessions about her daughter being upset with her. Therapist used MI & CBT with client to support client in processing her guilt and fear of approval of her daughter while also challenging irrational thoughts that lead her to feel shame. Client making progress processing more about the shame she feels for being on disability. Therapist also used roleplaying with client to help her prepare to discuss her needs to prioritize her own health on a routine basis. Therapist assessed for safety and stability and client denied  SI/HI/AVH.   Interventions: Cognitive Behavioral Therapy, Roleplay, and Motivational Interviewing  Diagnosis:   ICD-10-CM   1. Major depressive disorder, recurrent episode, moderate ([place]  F33.1       Plan of Care for emotion regulation:  Client to return for weekly therapy with therapist KSammuel Cooper for outpatient therapy and see medication provider for support of mood management.  Client to engage in CBT: challenging negative internal ruminations and self-talk AEB expressing toxic thoughts and challenging them with truth.  Client to practice DBT distress tolerance skills (such as distress tolerance and emotion regulation skills and achieving wise mind) to build support for dealing with emotional outbursts or internal emotional collapse AEB: using TIP, learning to ride the wave of emotion/sensations, staying in the present, and increasing their belief that they can do hard things.  Client to utilize BSP (brainspotting) with therapist to help client identify and process triggers for their emotional dysregulation, with goal of reducing said SUDs by 33% each session.  Client to ground their body if overwhelmed, flooded, or disassociating and not feeling safe using focused mindfulness, grounding, or meditation.  Client also to create more stability & structure AEB following goals to assist in helping stabilize their life domains, helping to calm their nervous system and to build healthy brain neuropathways. Client to prioritize self-care techniques and implement coping strategies to reduce the use of behaviors that incur consequences to themselves or others around them. Client is to practice self-compassion AEB being gentle with themselves, utilizing self-care  techniques daily or as needed when grieving something in the moment.  Client is to process grief/pain of her loss of her ex-boyfriend in a somatic body-felt sense way: ie using mindfulness, brainspotting, or trauma release as a method for  releasing body pain/tension caused by grief.  [name], LCSW, LCAS, CCTP, CCS, BSP

## 2020-10-26 ENCOUNTER — Other Ambulatory Visit: Payer: Self-pay

## 2020-10-26 ENCOUNTER — Ambulatory Visit: Payer: Medicare Other

## 2020-10-26 ENCOUNTER — Ambulatory Visit: Payer: Medicare Other | Admitting: Addiction (Substance Use Disorder)

## 2020-10-26 DIAGNOSIS — R252 Cramp and spasm: Secondary | ICD-10-CM

## 2020-10-26 DIAGNOSIS — M353 Polymyalgia rheumatica: Secondary | ICD-10-CM | POA: Diagnosis not present

## 2020-10-26 DIAGNOSIS — M6281 Muscle weakness (generalized): Secondary | ICD-10-CM

## 2020-10-26 DIAGNOSIS — R293 Abnormal posture: Secondary | ICD-10-CM

## 2020-10-26 NOTE — Therapy (Signed)
[facility] [address], [place], [place], [address] Phone: [phone]   Fax:  [phone]  Physical Therapy Treatment  Patient Details  Name: Danielle Bruce MRN: [id number] Date of Birth: 10-22-1960 Referring Provider (PT): Danielle Neer, MD   Encounter Date: 10/26/2020   PT End of Session - 10/26/20 1234     Visit Number 27    Date for PT Re-Evaluation 12/21/20    Authorization Type Medicare/Medicaid- KX needed    PT Start Time 1146    PT Stop Time 1228    PT Time Calculation (min) 42 min    Activity Tolerance Patient tolerated treatment well    Behavior During Therapy [facility] for tasks assessed/performed             Past Medical History:  Diagnosis Date   Anemia    Anxiety    Arthritis    osteoarthritis   ASCUS (atypical squamous cells of undetermined significance) on Pap smear 05/06/05   NEG HIGH RISK HPV--C&B BIOPSY BENIGN 12/2005   Asthma    Bipolar 2 disorder ([place])    Cancer ([place])    skin cancer - basal cell   Colon polyps    Complication of anesthesia    anxious afterwards, will get headaches    Constipation    Depression    Fibromyalgia 10/2013   GERD (gastroesophageal reflux disease)    Hearing loss on left    Heart murmur    never had any problems   Hemorrhoids    High cholesterol    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   cytology negative   IBS (irritable bowel syndrome)    Insomnia    Lymphocytic colitis    Lymphoma ([place])    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   Bone marrow biopsy showes 8% plasma cells IgA Lambda   Migraines    Osteoarthritis    Osteopenia    Peripheral neuropathy    PTSD (post-traumatic stress disorder)     Past Surgical History:  Procedure Laterality Date   BONE MARROW BIOPSY Left 12/18/2013   Plasma cell dyscrasia 8% population of plasma cells   BREAST BIOPSY Right    benign stereo   CESAREAN SECTION  26,37,85    CHOLECYSTECTOMY N/A 10/16/2012   Procedure: LAPAROSCOPIC CHOLECYSTECTOMY;  Surgeon: Danielle Faster. Cornett, MD;  Location: [facility];  Service: General;  Laterality: N/A;   COLONOSCOPY     numerous times   DILATION AND CURETTAGE OF UTERUS     ESOPHAGOGASTRODUODENOSCOPY     HEMORRHOID SURGERY  1993   x3   IUD REMOVAL  02/2015   Mirena   LAPAROSCOPIC LYSIS OF ADHESIONS N/A 10/16/2012   Procedure: LAPAROSCOPIC LYSIS OF ADHESIONS;  Surgeon: Danielle Moores A. Cornett, MD;  Location: [facility];  Service: General;  Laterality: N/A;   LAPAROSCOPY N/A 10/16/2012   Procedure: LAPAROSCOPY DIAGNOSTIC;  Surgeon: Danielle Faster. Cornett, MD;  Location: [facility];  Service: General;  Laterality: N/A;   PELVIC LAPAROSCOPY     RADIOLOGY WITH ANESTHESIA N/A 12/16/2015   Procedure: MRI OF BRAIN WITH AND WITHOUT CONTRAST;  Surgeon: Medication Radiologist, MD;  Location: [facility];  Service: Radiology;  Laterality: N/A;   SHOULDER SURGERY  2007/2008   SPINE SURGERY  2010   cervical    There were no vitals filed for this visit.   Subjective Assessment - 10/26/20 1149     Subjective Things have been terrible and my depression has been terrible due  to this.  My dog was helping and now I don't have that.  I've been sleeping a lot.    Pertinent History goes by "Danielle Bruce" ; OA bilateral feet; fibromyalgia; off cycle of steriod medicine currenlty    Patient Stated Goals Pt wants pain management and be able to move more without taking medication    Currently in Pain? Yes    Pain Score 5     Pain Location Back    Pain Orientation Right;Left;Lower    Pain Descriptors / Indicators Aching;Tightness    Pain Type Chronic pain    Pain Onset More than a month ago    Pain Frequency Constant    Aggravating Factors  activity, stress, staying in bed too much    Pain Relieving Factors rest, stretching, pain patches                [facility] PT Assessment - 10/26/20 0001       Assessment   Medical Diagnosis M77.41,M77.42 (ICD-10-CM) - Metatarsalgia  of both feet; M35.3 (ICD-10-CM) - Polymyalgia rheumatica    Referring Provider (PT) Danielle Neer, MD    Onset Date/Surgical Date 07/29/19    Hand Dominance Right      Cognition   Overall Cognitive Status Within Functional Limits for tasks assessed      Posture/Postural Control   Posture Comments NDI: 46% disability   no change due to set-back                          [facility] Adult PT Treatment/Exercise - 10/26/20 0001       Manual Therapy   Manual Therapy Soft tissue mobilization;Myofascial release    Myofascial Release cervical paraspinals and upper traps              Trigger Point Dry Needling - 10/26/20 0001     Consent Given? Yes    Muscles Treated Head and Neck Upper trapezius;Cervical multifidi;Suboccipitals    Upper Trapezius Response Twitch reponse elicited;Palpable increased muscle length    Oblique Capitus Response Twitch response elicited;Palpable increased muscle length    Suboccipitals Response Twitch response elicited;Palpable increased muscle length                    PT Short Term Goals - 03/29/20 1030       PT SHORT TERM GOAL #1   Title be independent in initial HEP    Status Achieved               PT Long Term Goals - 10/26/20 1154       PT LONG TERM GOAL #1   Title be independent in advanced HEP with completion 4-5x/wk for 5-10 each    Time 8    Period Weeks    Status On-going    Target Date 12/21/20      PT LONG TERM GOAL #2   Title Pt will be able to walk at least 1 mile without increased pain so she can return to birding.    Baseline I can walk 1 mile    Status Achieved      PT LONG TERM GOAL #3   Title report 75% less neck/UE pain throughout the day with normal functional activities.    Baseline not as painful and can do more stuff- 50-75% better- pt has been in depression and not able to do much at home    Time 8    Period Weeks  Status On-going    Target Date 12/21/20      PT LONG TERM GOAL  #4   Title initiate a gym exercise program and participate > or = to 1x/wk    Baseline --    Time 8    Period Weeks    Status New    Target Date 12/21/20      PT LONG TERM GOAL #5   Title Pt will demonstrate at least hip strength of 4+/5 for  improved stability for walking and lifting her grandchildren    Baseline 4/5 hip strength on Lt side    Time 8    Period Weeks    Status On-going      PT LONG TERM GOAL #6   Title improve NDI to < or = to 30% disability    Baseline 46%- no change due to recent depressive episode and pain flare-up    Time 8    Period Weeks    Status New    Target Date 12/21/20                   Plan - 10/26/20 1231     Clinical Impression Statement Sessions continue to be spread out to allow for time between sessions for pt to manage on her own and she has been doing well with this.   Pt has had a bout of pain and depression over the last 3 weeks.  Pt has lost her dog and her dog was a factor in improving her depression.  Pt and PT discussed appropriate and realistic goals for her to initiate a gym program and improve her consistency with HEP at home.  Pt plans to go to [place] to look into a gym membership.  Pt with slow progress due to complexity of her medical history. Therapy sessions do help pt to manage consistent muscle tension and allow improved mobility after.    Tension and trigger points in neck and upper traps.  Pt has good pain reduction after needling sessions in general and this makes pt more functional. Due to lack of motivation related to depression, pt's activity has been limited recently although pt was able to walk x 1 hour and care for her grandchildren.  Pt will return in 3 weeks for re-check and manual as needed.  PT has encouraged to slowly increase her activity as able.    PT Frequency Biweekly    PT Duration 8 weeks    PT Treatment/Interventions ADLs/Self Care Home Management;Cryotherapy;Electrical Stimulation;Iontophoresis 62m/ml  Dexamethasone;Moist Heat;Ultrasound;Neuromuscular re-education;Therapeutic exercise;Therapeutic activities;Patient/family education;Manual techniques;Dry needling;Passive range of motion;Taping    PT Next Visit Plan manual therapy and stress management as needed next session, follow-up on new goals    PT Home Exercise Plan Access Code: [email or web address]    Recommended Other Services recert sent 88/31/51   Consulted and Agree with Plan of Care Patient             Patient will benefit from skilled therapeutic intervention in order to improve the following deficits and impairments:  Abnormal gait, Increased fascial restricitons, Difficulty walking, Decreased range of motion, Pain, Decreased strength, Postural dysfunction  Visit Diagnosis: Pol[place]a [place] - Plan: PT plan of care cert/re-cert  Cramp and spasm - Plan: PT plan of care cert/re-cert  Muscle weakness (generalized) - Plan: PT plan of care cert/re-cert  Abnormal posture - Plan: PT plan of care cert/re-cert     Problem List Patient Active Problem List   Diagnosis Date Noted  GAD (generalized anxiety disorder) 12/26/2017   OCD (obsessive compulsive disorder) 12/26/2017   PTSD (post-traumatic stress disorder) 12/26/2017   DDD (degenerative disc disease), cervical 07/14/2016   Primary osteoarthritis of both feet 07/14/2016   Primary osteoarthritis of both hands 07/14/2016   Other fatigue 07/14/2016   History of IBS 07/14/2016   Osteopenia of multiple sites 07/14/2016   Fibromyalgia 01/13/2016   MGUS (monoclonal gammopathy of unknown significance) 12/23/2013   Chronic cholecystitis without calculus 10/18/2012   Abdominal pain, unspecified site 10/18/2012   Nausea alone 10/18/2012   Unspecified constipation 10/18/2012   Depression 10/18/2012   Anxiety    ASCUS (atypical squamous cells of undetermined significance) on Pap smear    IUD    Hemorrhoids 11/29/2010   Abdominal pain, left upper quadrant 11/29/2010     Sigurd Sos, PT 10/26/20 1:20 PM   [facility] [address] [place], [place], [address] Phone: [phone]   Fax:  [phone]  Name: Danielle Bruce MRN: [id number] Date of Birth: 1960/05/18

## 2020-10-28 DIAGNOSIS — F411 Generalized anxiety disorder: Secondary | ICD-10-CM | POA: Diagnosis not present

## 2020-10-28 DIAGNOSIS — J309 Allergic rhinitis, unspecified: Secondary | ICD-10-CM | POA: Diagnosis not present

## 2020-10-28 DIAGNOSIS — M797 Fibromyalgia: Secondary | ICD-10-CM | POA: Diagnosis not present

## 2020-10-28 DIAGNOSIS — K589 Irritable bowel syndrome without diarrhea: Secondary | ICD-10-CM | POA: Diagnosis not present

## 2020-10-28 DIAGNOSIS — G43909 Migraine, unspecified, not intractable, without status migrainosus: Secondary | ICD-10-CM | POA: Diagnosis not present

## 2020-10-28 DIAGNOSIS — M353 Polymyalgia rheumatica: Secondary | ICD-10-CM | POA: Diagnosis not present

## 2020-10-28 DIAGNOSIS — F4321 Adjustment disorder with depressed mood: Secondary | ICD-10-CM | POA: Diagnosis not present

## 2020-11-03 DIAGNOSIS — Z124 Encounter for screening for malignant neoplasm of cervix: Secondary | ICD-10-CM | POA: Diagnosis not present

## 2020-11-03 DIAGNOSIS — N951 Menopausal and female climacteric states: Secondary | ICD-10-CM | POA: Diagnosis not present

## 2020-11-03 DIAGNOSIS — Z8619 Personal history of other infectious and parasitic diseases: Secondary | ICD-10-CM | POA: Diagnosis not present

## 2020-11-03 DIAGNOSIS — Z9189 Other specified personal risk factors, not elsewhere classified: Secondary | ICD-10-CM | POA: Diagnosis not present

## 2020-11-04 NOTE — Addendum Note (Signed)
Addended by: Danie Binder on: 11/04/2020 11:39 AM   Modules accepted: Orders

## 2020-11-10 DIAGNOSIS — J3089 Other allergic rhinitis: Secondary | ICD-10-CM | POA: Diagnosis not present

## 2020-11-10 DIAGNOSIS — J301 Allergic rhinitis due to pollen: Secondary | ICD-10-CM | POA: Diagnosis not present

## 2020-11-10 DIAGNOSIS — J3081 Allergic rhinitis due to animal (cat) (dog) hair and dander: Secondary | ICD-10-CM | POA: Diagnosis not present

## 2020-11-14 DIAGNOSIS — R0981 Nasal congestion: Secondary | ICD-10-CM | POA: Diagnosis not present

## 2020-11-14 DIAGNOSIS — J069 Acute upper respiratory infection, unspecified: Secondary | ICD-10-CM | POA: Diagnosis not present

## 2020-11-14 DIAGNOSIS — R051 Acute cough: Secondary | ICD-10-CM | POA: Diagnosis not present

## 2020-11-14 DIAGNOSIS — U071 COVID-19: Secondary | ICD-10-CM | POA: Diagnosis not present

## 2020-11-15 ENCOUNTER — Ambulatory Visit: Payer: Medicare Other

## 2020-11-15 DIAGNOSIS — Z20822 Contact with and (suspected) exposure to covid-19: Secondary | ICD-10-CM | POA: Diagnosis not present

## 2020-11-18 ENCOUNTER — Ambulatory Visit: Payer: Medicare Other | Admitting: Addiction (Substance Use Disorder)

## 2020-11-18 ENCOUNTER — Ambulatory Visit: Payer: Medicare Other | Admitting: Psychiatry

## 2020-11-25 ENCOUNTER — Ambulatory Visit: Payer: Medicare Other | Admitting: Addiction (Substance Use Disorder)

## 2020-11-29 ENCOUNTER — Ambulatory Visit: Payer: Medicare Other | Admitting: Addiction (Substance Use Disorder)

## 2020-12-02 DIAGNOSIS — U071 COVID-19: Secondary | ICD-10-CM | POA: Diagnosis not present

## 2020-12-02 DIAGNOSIS — F4310 Post-traumatic stress disorder, unspecified: Secondary | ICD-10-CM | POA: Diagnosis not present

## 2020-12-02 DIAGNOSIS — J45909 Unspecified asthma, uncomplicated: Secondary | ICD-10-CM | POA: Diagnosis not present

## 2020-12-02 DIAGNOSIS — J309 Allergic rhinitis, unspecified: Secondary | ICD-10-CM | POA: Diagnosis not present

## 2020-12-02 DIAGNOSIS — N951 Menopausal and female climacteric states: Secondary | ICD-10-CM | POA: Diagnosis not present

## 2020-12-02 DIAGNOSIS — G43909 Migraine, unspecified, not intractable, without status migrainosus: Secondary | ICD-10-CM | POA: Diagnosis not present

## 2020-12-06 ENCOUNTER — Ambulatory Visit: Payer: Medicare Other | Admitting: Addiction (Substance Use Disorder)

## 2020-12-07 DIAGNOSIS — H1045 Other chronic allergic conjunctivitis: Secondary | ICD-10-CM | POA: Diagnosis not present

## 2020-12-07 DIAGNOSIS — J3081 Allergic rhinitis due to animal (cat) (dog) hair and dander: Secondary | ICD-10-CM | POA: Diagnosis not present

## 2020-12-07 DIAGNOSIS — J301 Allergic rhinitis due to pollen: Secondary | ICD-10-CM | POA: Diagnosis not present

## 2020-12-07 DIAGNOSIS — J3089 Other allergic rhinitis: Secondary | ICD-10-CM | POA: Diagnosis not present

## 2020-12-10 DIAGNOSIS — J3081 Allergic rhinitis due to animal (cat) (dog) hair and dander: Secondary | ICD-10-CM | POA: Diagnosis not present

## 2020-12-10 DIAGNOSIS — J301 Allergic rhinitis due to pollen: Secondary | ICD-10-CM | POA: Diagnosis not present

## 2020-12-10 DIAGNOSIS — J3089 Other allergic rhinitis: Secondary | ICD-10-CM | POA: Diagnosis not present

## 2020-12-20 ENCOUNTER — Ambulatory Visit: Payer: Medicare Other | Admitting: Addiction (Substance Use Disorder)

## 2020-12-27 ENCOUNTER — Other Ambulatory Visit: Payer: Self-pay

## 2020-12-27 ENCOUNTER — Ambulatory Visit: Payer: Medicare Other | Attending: Family Medicine

## 2020-12-27 DIAGNOSIS — R252 Cramp and spasm: Secondary | ICD-10-CM | POA: Diagnosis not present

## 2020-12-27 DIAGNOSIS — M6281 Muscle weakness (generalized): Secondary | ICD-10-CM | POA: Insufficient documentation

## 2020-12-27 DIAGNOSIS — J3081 Allergic rhinitis due to animal (cat) (dog) hair and dander: Secondary | ICD-10-CM | POA: Diagnosis not present

## 2020-12-27 DIAGNOSIS — R293 Abnormal posture: Secondary | ICD-10-CM | POA: Diagnosis not present

## 2020-12-27 DIAGNOSIS — J301 Allergic rhinitis due to pollen: Secondary | ICD-10-CM | POA: Diagnosis not present

## 2020-12-27 DIAGNOSIS — M353 Polymyalgia rheumatica: Secondary | ICD-10-CM | POA: Diagnosis not present

## 2020-12-27 DIAGNOSIS — J3089 Other allergic rhinitis: Secondary | ICD-10-CM | POA: Diagnosis not present

## 2020-12-27 NOTE — Patient Instructions (Signed)
     [facility] Welcome to [facility]! Here you will find all the information you will need regarding your pool therapy. If you have further questions at any time, please call our office at [phone]. After completing your initial evaluation in the [facility], you may be eligible to complete a portion of your therapy in the pool. A typical week of therapy will consist of 1-2 typical physical therapy visits at our [facility] location and an additional session of therapy in the pool located at the [facility]  at [address]. [address], [place] [address]. The phone number at the pool site is [phone]. Please call this number if you are running late or need to cancel your appointment.  Aquatic therapy will be offered on Friday afternoons. Each session will last approximately 45 minutes. All scheduling and payments for aquatic therapy sessions, including cancelations, will be done through our [facility] location.  To be eligible for aquatic therapy, these criteria must be met: You must be able to independently change in the locker room and get to the pool deck. A caregiver can come with you to help if needed. There are bleachers for a caregiver to sit on next to the pool. No one with an open wound is permitted in the pool.  Handicap parking is available in the front and there is a drop off option for even closer accessibility. Please arrive 15 minutes prior to your appointment to prepare for your pool session. You must sign in at the front desk upon your arrival. Please be sure to attend to any toileting needs prior to entering the pool. Locker rooms for changing are located to the right of the check-in desk. There is direct access to the pool deck from the locker room. You can lock your belongings in a locker but must bring a lock. Your therapist will greet you on the pool deck. There may be other swimmers in the pool at the  same time but your session is one-on-one with the therapist.   

## 2020-12-27 NOTE — Therapy (Signed)
[facility] @ [address] [place], [place], [address] Phone: [phone]   Fax:  [phone]  Physical Therapy Treatment  Patient Details  Name: Danielle Bruce MRN: [id number] Date of Birth: 26-Dec-1960 Referring Provider (PT): Danielle Neer, MD   Encounter Date: 12/27/2020   PT End of Session - 12/27/20 1236     Visit Number 28    Date for PT Re-Evaluation 02/21/21    Authorization Type Medicare/Medicaid- KX needed    PT Start Time 1148    PT Stop Time 1234    PT Time Calculation (min) 46 min    Activity Tolerance Patient tolerated treatment well    Behavior During Therapy [facility] for tasks assessed/performed             Past Medical History:  Diagnosis Date   Anemia    Anxiety    Arthritis    osteoarthritis   ASCUS (atypical squamous cells of undetermined significance) on Pap smear 05/06/05   NEG HIGH RISK HPV--C&B BIOPSY BENIGN 12/2005   Asthma    Bipolar 2 disorder ([place])    Cancer ([place])    skin cancer - basal cell   Colon polyps    Complication of anesthesia    anxious afterwards, will get headaches    Constipation    Depression    Fibromyalgia 10/2013   GERD (gastroesophageal reflux disease)    Hearing loss on left    Heart murmur    never had any problems   Hemorrhoids    High cholesterol    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   cytology negative   IBS (irritable bowel syndrome)    Insomnia    Lymphocytic colitis    Lymphoma ([place])    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   Bone marrow biopsy showes 8% plasma cells IgA Lambda   Migraines    Osteoarthritis    Osteopenia    Peripheral neuropathy    PTSD (post-traumatic stress disorder)     Past Surgical History:  Procedure Laterality Date   BONE MARROW BIOPSY Left 12/18/2013   Plasma cell dyscrasia 8% population of plasma cells   BREAST BIOPSY Right    benign stereo   CESAREAN SECTION  67,89,38   CHOLECYSTECTOMY  N/A 10/16/2012   Procedure: LAPAROSCOPIC CHOLECYSTECTOMY;  Surgeon: Joyice Faster. Cornett, MD;  Location: [facility];  Service: General;  Laterality: N/A;   COLONOSCOPY     numerous times   DILATION AND CURETTAGE OF UTERUS     ESOPHAGOGASTRODUODENOSCOPY     HEMORRHOID SURGERY  1993   x3   IUD REMOVAL  02/2015   Mirena   LAPAROSCOPIC LYSIS OF ADHESIONS N/A 10/16/2012   Procedure: LAPAROSCOPIC LYSIS OF ADHESIONS;  Surgeon: Marcello Moores A. Cornett, MD;  Location: [facility];  Service: General;  Laterality: N/A;   LAPAROSCOPY N/A 10/16/2012   Procedure: LAPAROSCOPY DIAGNOSTIC;  Surgeon: Joyice Faster. Cornett, MD;  Location: [facility];  Service: General;  Laterality: N/A;   PELVIC LAPAROSCOPY     RADIOLOGY WITH ANESTHESIA N/A 12/16/2015   Procedure: MRI OF BRAIN WITH AND WITHOUT CONTRAST;  Surgeon: Medication Radiologist, MD;  Location: [facility];  Service: Radiology;  Laterality: N/A;   SHOULDER SURGERY  2007/2008   SPINE SURGERY  2010   cervical    There were no vitals filed for this visit.   Subjective Assessment - 12/27/20 1152     Subjective I have had to miss multiple appts due to being  sick twice.  The last time that I had DN, it was painful.  Lt>Rt upper traps have been painful.    Pertinent History goes by "[name]" ; OA bilateral feet; fibromyalgia; off cycle of steriod medicine currenlty    Patient Stated Goals Pt wants pain management and be able to move more without taking medication.    Currently in Pain? Yes    Pain Score 4     Pain Location Back   neck   Pain Orientation Right;Left;Lower    Pain Descriptors / Indicators Aching;Tightness    Pain Type Chronic pain    Pain Onset More than a month ago    Pain Frequency Constant    Aggravating Factors  activity, stress, not doing enought, walking too long    Pain Relieving Factors rest, stretching, pain patches                [facility] PT Assessment - 12/27/20 0001       Assessment   Medical Diagnosis M77.41,M77.42 (ICD-10-CM) - Metatarsalgia of  both feet; M35.3 (ICD-10-CM) - Polymyalgia rheumatica    Referring Provider (PT) Danielle Neer, MD    Onset Date/Surgical Date 07/29/19    Hand Dominance Right      Cognition   Overall Cognitive Status Within Functional Limits for tasks assessed      Posture/Postural Control   Posture Comments NDI: 46% disability   no change due to set-back                          [facility] Adult PT Treatment/Exercise - 12/27/20 0001       Manual Therapy   Manual Therapy Soft tissue mobilization;Myofascial release    Manual therapy comments elongation to bil neck Lt>Rt with passive stretch, gentle segmental glides and PA mobs                     PT Education - 12/27/20 1226     Education Details aquatics IT trainer) Educated Patient    Methods Explanation;Handout    Comprehension Verbalized understanding              PT Short Term Goals - 03/29/20 1030       PT SHORT TERM GOAL #1   Title be independent in initial HEP    Status Achieved               PT Long Term Goals - 12/27/20 1158       PT LONG TERM GOAL #1   Title be independent in advanced HEP with completion 4-5x/wk for 5-10 each    Time 8    Period Weeks    Status On-going    Target Date 02/21/21      PT LONG TERM GOAL #2   Title Pt will be able to walk at least 1 mile without increased pain so she can return to birding.    Baseline I can walk 1 mile    Status Achieved      PT LONG TERM GOAL #3   Title report 75% less neck/UE pain throughout the day with normal functional activities.    Baseline not as painful and can do more stuff- 50-75% better- pt has been sick and caring for her grandchildren who have been sick.    Time 8    Period Weeks    Status On-going    Target Date 02/21/21      PT  LONG TERM GOAL #4   Title initiate a gym exercise program and participate > or = to 1x/wk    Baseline unable to complete due to being sick.  Pt will begin with aquatic PT    Time 8     Period Weeks    Status On-going    Target Date 02/21/21      PT LONG TERM GOAL #5   Title Pt will demonstrate at least hip strength of 4+/5 for  improved stability for walking and lifting her grandchildren    Baseline 4/5 hip strength on Lt side    Time 8    Status New    Target Date 02/21/21                   Plan - 12/27/20 1238     Clinical Impression Statement Pt with lapse in treatment due to multiple illnesses.  Pt has not been able to do her exercises or initiate a gym program due to this.  Pt has been helping a lot with her 60 young grandchildren, so this has kept her busy and active.  Pt reports that she feels like she has maintained a good level even without being here and with being sick. Pt is able to walk about 1 mile at a time and is stretching as able.  She is interested and motivated to initiate aquatic PT and a regular gym program.  Pt has multiple medical conditions that will make her progress slow and will be a good candidate for aquatics and regular activity.  Pt will benefit from manual therapy every 3 weeks and aquatics 1x/wk.    PT Frequency 2x / week    PT Duration 8 weeks    PT Treatment/Interventions ADLs/Self Care Home Management;Cryotherapy;Electrical Stimulation;Iontophoresis 36m/ml Dexamethasone;Moist Heat;Ultrasound;Neuromuscular re-education;Therapeutic exercise;Therapeutic activities;Patient/family education;Manual techniques;Dry needling;Passive range of motion;Taping;Aquatic Therapy    PT Next Visit Plan manual therapy and stress management as needed next session, aquatics, gym exercise progression    PT Home Exercise Plan Access Code: [email or web address]    Recommended Other Services recert sent 109/81/19   Consulted and Agree with Plan of Care Patient             Patient will benefit from skilled therapeutic intervention in order to improve the following deficits and impairments:  Abnormal gait, Increased fascial restricitons, Difficulty walking,  Decreased range of motion, Pain, Decreased strength, Postural dysfunction  Visit Diagnosis: Polym[place]algia rheumatica [place] - Plan: PT plan of care cert/re-cert  Cramp and spasm - Plan: PT plan of care cert/re-cert  Muscle weakness (generalized) - Plan: PT plan of care cert/re-cert  Abnormal posture - Plan: PT plan of care cert/re-cert     Problem List Patient Active Problem List   Diagnosis Date Noted   GAD (generalized anxiety disorder) 12/26/2017   OCD (obsessive compulsive disorder) 12/26/2017   PTSD (post-traumatic stress disorder) 12/26/2017   DDD (degenerative disc disease), cervical 07/14/2016   Primary osteoarthritis of both feet 07/14/2016   Primary osteoarthritis of both hands 07/14/2016   Other fatigue 07/14/2016   History of IBS 07/14/2016   Osteopenia of multiple sites 07/14/2016   Fibromyalgia 01/13/2016   MGUS (monoclonal gammopathy of unknown significance) 12/23/2013   Chronic cholecystitis without calculus 10/18/2012   Abdominal pain, unspecified site 10/18/2012   Nausea alone 10/18/2012   Unspecified constipation 10/18/2012   Depression 10/18/2012   Anxiety    ASCUS (atypical squamous cells of undetermined significance) on Pap smear    IUD  Hemorrhoids 11/29/2010   Abdominal pain, left upper quadrant 11/29/2010    Sigurd Sos, PT 12/27/20 12:41 PM   [facility] @ [address] [place], [place], [address] Phone: [phone]   Fax:  [phone]  Name: Danielle Bruce MRN: [id number] Date of Birth: January 10, 1961

## 2020-12-28 ENCOUNTER — Ambulatory Visit
Admission: RE | Admit: 2020-12-28 | Discharge: 2020-12-28 | Disposition: A | Discharge status: Home / Self Care | Payer: Medicare Other | Source: Ambulatory Visit | Attending: Family Medicine | Admitting: Family Medicine

## 2020-12-28 ENCOUNTER — Other Ambulatory Visit: Payer: Self-pay | Admitting: Family Medicine

## 2020-12-28 DIAGNOSIS — G43909 Migraine, unspecified, not intractable, without status migrainosus: Secondary | ICD-10-CM | POA: Diagnosis not present

## 2020-12-28 DIAGNOSIS — R0689 Other abnormalities of breathing: Secondary | ICD-10-CM

## 2020-12-28 DIAGNOSIS — M797 Fibromyalgia: Secondary | ICD-10-CM | POA: Diagnosis not present

## 2020-12-28 DIAGNOSIS — F4310 Post-traumatic stress disorder, unspecified: Secondary | ICD-10-CM | POA: Diagnosis not present

## 2020-12-28 DIAGNOSIS — R058 Other specified cough: Secondary | ICD-10-CM | POA: Diagnosis not present

## 2020-12-28 DIAGNOSIS — Z23 Encounter for immunization: Secondary | ICD-10-CM | POA: Diagnosis not present

## 2020-12-30 ENCOUNTER — Ambulatory Visit: Payer: Medicare Other | Admitting: Addiction (Substance Use Disorder)

## 2020-12-31 DIAGNOSIS — J3089 Other allergic rhinitis: Secondary | ICD-10-CM | POA: Diagnosis not present

## 2020-12-31 DIAGNOSIS — J3081 Allergic rhinitis due to animal (cat) (dog) hair and dander: Secondary | ICD-10-CM | POA: Diagnosis not present

## 2020-12-31 DIAGNOSIS — J301 Allergic rhinitis due to pollen: Secondary | ICD-10-CM | POA: Diagnosis not present

## 2021-01-10 ENCOUNTER — Other Ambulatory Visit: Payer: Self-pay

## 2021-01-10 ENCOUNTER — Ambulatory Visit (INDEPENDENT_AMBULATORY_CARE_PROVIDER_SITE_OTHER): Payer: Medicare Other | Admitting: Psychiatry

## 2021-01-10 ENCOUNTER — Encounter: Payer: Self-pay | Admitting: Psychiatry

## 2021-01-10 ENCOUNTER — Other Ambulatory Visit: Payer: Self-pay | Admitting: Psychiatry

## 2021-01-10 DIAGNOSIS — F4310 Post-traumatic stress disorder, unspecified: Secondary | ICD-10-CM | POA: Diagnosis not present

## 2021-01-10 DIAGNOSIS — F422 Mixed obsessional thoughts and acts: Secondary | ICD-10-CM | POA: Diagnosis not present

## 2021-01-10 DIAGNOSIS — F4001 Agoraphobia with panic disorder: Secondary | ICD-10-CM | POA: Diagnosis not present

## 2021-01-10 DIAGNOSIS — F411 Generalized anxiety disorder: Secondary | ICD-10-CM

## 2021-01-10 DIAGNOSIS — F331 Major depressive disorder, recurrent, moderate: Secondary | ICD-10-CM | POA: Diagnosis not present

## 2021-01-10 DIAGNOSIS — R7989 Other specified abnormal findings of blood chemistry: Secondary | ICD-10-CM

## 2021-01-10 DIAGNOSIS — F5105 Insomnia due to other mental disorder: Secondary | ICD-10-CM

## 2021-01-10 DIAGNOSIS — F902 Attention-deficit hyperactivity disorder, combined type: Secondary | ICD-10-CM | POA: Diagnosis not present

## 2021-01-10 MED ORDER — METHYLPHENIDATE HCL 10 MG PO TABS: 10.0000 mg | ORAL_TABLET | Freq: Two times a day (BID) | ORAL | 0 refills | Status: DC

## 2021-01-10 MED ORDER — TRAZODONE HCL 50 MG PO TABS: ORAL_TABLET | ORAL | 1 refills | Status: DC

## 2021-01-10 MED ORDER — FLUVOXAMINE MALEATE 50 MG PO TABS: 50.0000 mg | ORAL_TABLET | Freq: Two times a day (BID) | ORAL | 1 refills | Status: DC

## 2021-01-10 NOTE — Progress Notes (Signed)
Danielle Bruce [id number] 1960-03-30 60 y.o.   Subjective:   Patient ID:  Danielle Bruce is a 60 y.o. (DOB 1960-07-22) female.  Chief Complaint:  Chief Complaint  Patient presents with   Follow-up    Bipolar II disorder ([place])   Anxiety   Depression   Post-Traumatic Stress Disorder    HPI Danielle Bruce presents for follow-up of multiple dxes.  visit in April and taken off lamotrigine DT possible skin reaction.  Had appt with [facility] dermatology and they assume that skin reaction with itching was related to lamotrigine. 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 Per oncology most likely drug is lamotrigine. And she weaned off of the medication to determine if the cutaneous lesions are related. Lesions seem less without lamotrigine.  At this point skin reaction is presumed related to lamotrigine.  No other disease sx. She is well aware that she has been on lamotrigine for many years with good results for depression control. There is a risk of relapse as she comes off of the medication and she is worried about that. Smart phone helps her cognitive  And other productivity.    seen October 2020.  Restarted low dosage ADD bc concerns over STM, concentration and productivity.  Ritalin 5 mg twice daily.  Also suggested she retry N-acetylcysteine for mild cognitive complaints.  Last seen April 07, 2019 the following was noted: Mostly good.  Anxiety is mostly better, but depression is a little worse seasonally.  Not able to go help her daughter.   Low energy but does have motivation.  Ritalin helps energy and focus but doesn't take it regularly bc irregular sleep cycle.   Ativan only when having medical issues.    Therapy working on trauma issues is very hard.  Struggling with the fact she doesn't have memory of large parts of her past.   No manic sx off mood  stabilizers.  Covid isolation reduce stressors overall and not prominently depressed.   No meds were changed.  June 25, 2019 appointment the following is noted: F real worried about Covid despite vaccination.   Started fluvoxamine 25 on May 14, 2019 from PCP mainly for inflammation but possibly thinking she might be depressed. Seen benefit for energy and sleep pattern and more appropriate feelings and less numb.  Now realizes she had been depressed for awhile but just numb with depression.  Anniversaries of deaths of friends including last BF Danielle Bruce a little over a year ago.   Thinks of him daily. His 58 yo D died of unknown cause recently.  Stress D moved to beach. Wonders about increasing the fluvoxamine  Danielle Bruce living in [place] and hasn't seen him in a year. Danielle Bruce on disability for depression. Patient reports more depression.  Plan: Realized lately she's depressed and started Luvox again for it and for the anti-inflammatory potential for her health.  Has seen some benefit.   Prior benefit at 25 mg daily.  Optiion increase but slightly bc med sensitive. AGREE increase fluvoxamine to 37.5 mg daily.  08/06/2019 appointment with the following noted: Increased fluvoxamine to 37.5 mg and it helped the depression. Could see a big difference and had been more depressed than she even realized. Outlook better and sleep pattern normalized.    More energy and motivation.   But also started having prolonged HA.  HA lasted a couple of weeks.   Assumed HA was  DT increased fluvoxamine so reduced to 25 mg on 07/28/19.  07/29/19 Episode of confusion occurred also.  Went to doctor while confused.  They couldn't determine cause.    Resolved in 1 day. FM flare up since fall/winter. To beach with daughter 3 mos minimum to give her shots for IVF treatment. Patient reports panic recently and recent difficulty with anxiety.    Occ flashbacks.   Patient denies difficulty with sleep initiation or maintenance. Denies  appetite disturbance.  Patient reports that energy and motivation have been good.  Patient denies any difficulty with concentration.  Patient denies any suicidal ideation. Plan: AGREE increase fluvoxamine to 37.5 mg daily once HA is controlled for several weekcs.  01/01/20 appt with the following noted: Increased fluvoxamine for awhile but got more HA and reduced it. Anxiety is better in general now.  But is interested in increasing it DT coming winter. When anxiety is not good it catches her off guard. Likely had Covid in August but early test was negative.  Got prednisone but sick for 5 weeks.  Lost taste and smell still going on.  D also had Covid.  Felt really sick and terrified with bad mental health at the time.  Was traumatic for her.  Had been vaccinated.   Had panic attack at the ER and took a long time to get the Ativan.  D moving back to area and pt will have to help with childcare so hopes to more aggressively treat her health problems. 2 gkids and 2 more coming. Danielle Bruce married without kids yet. Plan: started Luvox again for it and for the anti-inflammatory potential for her health.  Has seen some benefit.   Prior benefit at 25 mg daily.  Optiion increase but slightly bc med sensitive. AGREE increase fluvoxamine to 37.5 mg daily once HA is controlled for several weeks.  03/03/2020 appointment with the following noted: On Day 10 of Covid.  2nd bout. A lot easier this time. Had gotten Luvox up to 50 mg daily and tolerated.  Then doubled it to 100 mg daily. Wonders about the proper dose for maintenance.   No taste and smell, exhausted, congestion and mild cough.  Energy is better than it was in the beginning.  Made her depressed and anxious again and that part is better also.  Last time had to go to ER with Covid.  Only ER visit in 2-3 years.  Less migraine than in the past. Overall was doing better with last couple months.  Trying to do anti-inflammatory diet.  Vegetarian, no dairy or  gluten.  Natural sugars.   Still some anxiety. D moved back here and pregnant with 2nd set of twins. Current twins are 58 yo.   Will restart trauma therapy and it's helped over the last couple of years.  More freedom from things.      Less daily dread with anxiety but still some panic which can be triggered.  Less OCD with fluvoxamine unless triggered.   Taking Ritalin 10 mg each AM and tolerating and benefiting. occ NM with few dreams overall. Plan: started Luvox again for it and for the anti-inflammatory potential for her health.  Has seen some benefit.   Prior benefit at 25 mg daily.  Med sensitive. AGREE increase fluvoxamine to  50  mg daily. Up to 100 mg daily if desired.   04/22/19 appt with following noted: Mostly good and randomly psycho.  Really busy with Danielle Bruce [redacted] weeks pregnant with twins and twins 58 yo  at home.  She's helping and her H is away. Mo older and slower.  Pt overworking.  When with Danielle Bruce kids interfere with sleep.  Doesn't eat as well with Danielle Bruce affects her FM and energy.   Recently exhausted and needed rest but couldn't go home DT D had to go to hospital.  Her H critical at times and she lost it emotionally over this. Almost suicidal reaction with weird intrusive thoughts in response to the criticism.   Got a break and it helped.   Rheum adjusted Lyrica to BID. Had accident fall with one of babies and was bleeding.  She became hysterical.   Upset now that she couldn't control her emotions at the time.  Couldn't get herself under control to deal with the crisis.    Overall doing ok but doesn't handle stressors well and lose it.   Plan: AGREE with increase fluvoxamine to  50  mg daily. Up to 100 mg daily if desired.  06/23/2020 appointment with the following noted: Increased fluvoxamine 25 AM and 50 mg PM and tolerated it.  Been on this dose awhile.  Going to [place] May 6-19 and son taking her.  Working for [employer] and been successful. Also going to [place],  [place].  Excited.   Occ overwhelmed. Starting therapy with Danielle Bruce and looking forward to it and dealing with trauma stuff. Nervous about doing the therapy and getting decompensated like what happened last time. Prednisone hellps mood and body. Not as depressed as in the past.  Sleep is reasonably good.  Appetite is normal.  Anxiety intermittent.  No suicidal thoughts Plan: No med changes  08/19/2020 appointment with the following noted: Not good.  Dog died 2 week ago and was very close to her for 16 years.  She had been a great comfort during losses and health problems.  Dog helped her through periods of SI. Exhausted. Danielle Bruce in [place] and going in early July. Wonders how long this will last.  Also increased fluvoxamine and now out and having withdrawal. Disc difference between grief and depression. Danielle Bruce needs therapist who takes Medicaid. Plan: no med changes  01/10/21 appt noted: Had covid 3rd time. Still on fluvox 50, Ritalin 10 mg daily, Deplin, trazodone 50 mg HS. Helping with gkids and been sick a lot since September. Been doing so so overall.  At times is good. Son-in-law will be getting raise and D can quit work for a year or so. Danielle Bruce and his wife in [place] and that's good. Parents getting old. Both parents hearing problems and M more forgetful. Parents personality different.  Less therapy lately bc schedules.  Has dealt with a lot of trauma and will resume after new year and feels it has been helpful.  Past psych meds: Lithium, carbamazepine, oxcarbazepine, topiramate, valproic acid, lamotrigine, gabapentin  Zyprexa, Seroquel, Latuda headaches, Geodon,  Risperidone, Stelazine,  Rexulti,  Wellbutrin with side effects,  sertraline irritability, duloxetine, fluvoxamine 100 with Covid, fluoxetine, Stopped desipramine DT skin issues which now resolved.  It helped GI px.  buspirone side effects,   N-acetylcysteine, pramipexole,  methylphenidate,  Adderall,   Ambien with amnesia,  trazodone, Ativan  Review of Systems:  Review of Systems  HENT:  Positive for congestion and voice change.   Gastrointestinal:  Positive for abdominal pain. Negative for nausea.  Neurological:  Negative for tremors and weakness. Less HA with new meds.  Medications: I have reviewed the patient's current medications.  Current Outpatient Medications  Medication Sig Dispense Refill  albuterol (PROVENTIL HFA;VENTOLIN HFA) 108 (90 Base) MCG/ACT inhaler Inhale 2 puffs into the lungs every 6 (six) hours as needed for wheezing or shortness of breath.     Ascorbic Acid (VITAMIN C) 100 MG tablet Take 100 mg by mouth daily.     azelastine (ASTELIN) 0.1 % nasal spray Place into both nostrils 2 (two) times daily. Use in each nostril as directed     BEPREVE 1.5 % SOLN Place 1 drop into both eyes daily.      Coenzyme Q10 (CO Q 10 PO) Take by mouth daily.     cyclobenzaprine (FLEXERIL) 10 MG tablet Take 10 mg by mouth at bedtime as needed.     diphenhydrAMINE (BENADRYL) 25 mg capsule Take 50 mg by mouth every 6 (six) hours as needed (HEADACHES).     EPINEPHrine 0.3 mg/0.3 mL IJ SOAJ injection as needed.     Erenumab-aooe 70 MG/ML SOAJ Inject into the skin every 28 (twenty-eight) days.     fluticasone (FLONASE) 50 MCG/ACT nasal spray Place 2 sprays into both nostrils daily. 16 g 0   L-methylfolate Calcium 15 MG TABS TAKE 1 TABLET BY MOUTH DAILY 30 tablet 11   levocetirizine (XYZAL) 5 MG tablet Take 5 mg by mouth every evening.  5   LORazepam (ATIVAN) 1 MG tablet Take 1 tablet (1 mg total) by mouth every 8 (eight) hours as needed for anxiety. (takes 2 mg when having a medical procedure.) 30 tablet 1   methocarbamol (ROBAXIN) 500 MG tablet Take 1 tablet (500 mg total) by mouth daily as needed for muscle spasms. 30 tablet 1   [START ON 02/07/2021] methylphenidate (RITALIN) 10 MG tablet Take 1 tablet (10 mg total) by mouth 2 (two) times daily. 60 tablet 0   montelukast (SINGULAIR) 10 MG tablet Take 10 mg by  mouth at bedtime.     oxyCODONE-acetaminophen (PERCOCET/ROXICET) 5-325 MG per tablet Take 2 tablets by mouth daily as needed for moderate pain or severe pain (mighraine.).      polyethylene glycol (MIRALAX / GLYCOLAX) packet Take 17 g by mouth daily as needed for mild constipation.     PRAVASTATIN SODIUM PO Take 40 mg by mouth.      pregabalin (LYRICA) 50 MG capsule Take 100 mg by mouth at bedtime. 1 tab in am and 1 tab HS.     PROLIA 60 MG/ML SOSY injection BRING TO THE OFFICE FOR INJECTION ON 02/15/2018 AS DIRECTED ONCE EVERY 6 MONTHS     promethazine (PHENERGAN) 25 MG tablet Take 25 mg by mouth every 6 (six) hours as needed for nausea.     Rimegepant Sulfate (NURTEC PO) Take by mouth as needed.     TURMERIC PO Take by mouth daily.     VASCEPA 1 g CAPS TK 2 CS PO BID WC  3   VITAMIN D PO Take by mouth. Take 5075m daily     VOLTAREN 1 % GEL Apply 2 g topically 4 (four) times daily as needed (JOINT PAIN).   3   dexlansoprazole (DEXILANT) 60 MG capsule as needed. (Patient not taking: Reported on 08/19/2020)     fluorouracil (EFUDEX) 5 % cream as needed.     fluvoxaMINE (LUVOX) 50 MG tablet Take 1 tablet (50 mg total) by mouth 2 (two) times daily. 180 tablet 1   LORazepam (ATIVAN) 2 MG/ML concentrated solution Take 0.5 mLs (1 mg total) by mouth every 8 (eight) hours as needed for anxiety (severe panic). 30 mL 1   methylphenidate (  RITALIN) 10 MG tablet Take 1 tablet (10 mg total) by mouth 2 (two) times daily with breakfast and lunch. 60 tablet 0   traZODone (DESYREL) 50 MG tablet TAKE 1 TABLET(50 MG) BY MOUTH AT BEDTIME 90 tablet 1   No current facility-administered medications for this visit.    Medication Side Effects: None  Allergies:  Allergies  Allergen Reactions   Sulfa Antibiotics Nausea And Vomiting    Causes pt to vomit blood   Gluten Meal Other (See Comments)    Sensitivity.    Lactose Intolerance (Gi) Nausea And Vomiting   Flagyl [Metronidazole Hcl] Nausea And Vomiting    Morphine And Related Other (See Comments)    Reaction: Depression, emotional    Past Medical History:  Diagnosis Date   Anemia    Anxiety    Arthritis    osteoarthritis   ASCUS (atypical squamous cells of undetermined significance) on Pap smear 05/06/05   NEG HIGH RISK HPV--C&B BIOPSY BENIGN 12/2005   Asthma    Bipolar 2 disorder ([place])    Cancer ([place])    skin cancer - basal cell   Colon polyps    Complication of anesthesia    anxious afterwards, will get headaches    Constipation    Depression    Fibromyalgia 10/2013   GERD (gastroesophageal reflux disease)    Hearing loss on left    Heart murmur    never had any problems   Hemorrhoids    High cholesterol    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   cytology negative   IBS (irritable bowel syndrome)    Insomnia    Lymphocytic colitis    Lymphoma (HCC)    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   Bone marrow biopsy showes 8% plasma cells IgA Lambda   Migraines    Osteoarthritis    Osteopenia    Peripheral neuropathy    PTSD (post-traumatic stress disorder)     Family History  Problem Relation Age of Onset   Diabetes Father    Hypertension Father    Hyperlipidemia Father    Heart disease Maternal Grandfather    Heart disease Paternal Grandmother    Heart disease Paternal Grandfather    Depression Son    Depression Son    Anxiety disorder Son    Depression Daughter    Anxiety disorder Daughter    Asthma Daughter     Social History   Socioeconomic History   Marital status: Divorced    Spouse name: Not on file   Number of children: 3   Years of education: Not on file   Highest education level: Not on file  Occupational History   Not on file  Tobacco Use   Smoking status: Former    Types: Cigarettes    Quit date: 05/13/2014    Years since quitting: 6.6   Smokeless tobacco: Never   Tobacco comments:    social   Vaping Use   Vaping Use: Never used  Substance and Sexual Activity    Alcohol use: Yes    Alcohol/week: 0.0 standard drinks    Comment: rare   Drug use: Never   Sexual activity: Not Currently    Comment: -1st intercourse 60 yo-More than 5 partners  Other Topics Concern   Not on file  Social History Narrative   Not on file   Social Determinants of Health   Financial Resource Strain: Not on file  Food Insecurity: Not on file  Transportation Needs: Not  on file  Physical Activity: Not on file  Stress: Not on file  Social Connections: Not on file  Intimate Partner Violence: Not on file    Past Medical History, Surgical history, Social history, and Family history were reviewed and updated as appropriate.   Please see review of systems for further details on the patient's review from today.   Objective:   Physical Exam:  LMP 12/28/2010   Physical Exam Constitutional:      General: She is not in acute distress. Musculoskeletal:        General: No deformity.  Neurological:     Mental Status: She is alert and oriented to person, place, and time.     Coordination: Coordination normal.  Psychiatric:        Attention and Perception: Attention and perception normal. She does not perceive auditory or visual hallucinations.        Mood and Affect: Mood is not anxious or depressed. Affect is not labile, blunt, angry, tearful or inappropriate.        Speech: Speech normal.        Behavior: Behavior normal.        Thought Content: Thought content normal. Thought content is not paranoid or delusional. Thought content does not include homicidal or suicidal ideation. Thought content does not include homicidal or suicidal plan.        Cognition and Memory: Cognition and memory normal.        Judgment: Judgment normal.     Comments: Insight intact Less down than last time Stressed with parents.    Lab Review:     Component Value Date/Time   NA 142 08/01/2016 1713   NA 144 07/12/2015 1521   K 4.7 08/01/2016 1713   K 3.8 07/12/2015 1521   CL 107  08/01/2016 1713   CO2 23 08/01/2016 1713   CO2 27 07/12/2015 1521   GLUCOSE 95 08/01/2016 1713   GLUCOSE 82 07/12/2015 1521   BUN 14 08/01/2016 1713   BUN 10.3 07/12/2015 1521   CREATININE 0.87 08/01/2016 1713   CREATININE 0.8 07/12/2015 1521   CALCIUM 10.0 08/01/2016 1713   CALCIUM 9.9 07/12/2015 1521   PROT 7.4 08/01/2016 1713   PROT 7.4 07/12/2015 1521   ALBUMIN 4.8 08/01/2016 1713   ALBUMIN 4.4 07/12/2015 1521   AST 23 08/01/2016 1713   AST 22 07/12/2015 1521   ALT 26 08/01/2016 1713   ALT 31 07/12/2015 1521   ALKPHOS 55 08/01/2016 1713   ALKPHOS 54 07/12/2015 1521   BILITOT 0.7 08/01/2016 1713   BILITOT 0.40 07/12/2015 1521   GFRNONAA 76 08/01/2016 1713   GFRAA 88 08/01/2016 1713       Component Value Date/Time   WBC 5.9 08/01/2016 1713   RBC 4.71 08/01/2016 1713   HGB 14.6 08/01/2016 1713   HGB 14.1 07/12/2015 1521   HCT 43.1 08/01/2016 1713   HCT 41.5 07/12/2015 1521   PLT 254 08/01/2016 1713   PLT 248 07/12/2015 1521   MCV 91.5 08/01/2016 1713   MCV 92.2 07/12/2015 1521   MCH 31.0 08/01/2016 1713   MCHC 33.9 08/01/2016 1713   RDW 13.2 08/01/2016 1713   RDW 13.3 07/12/2015 1521   LYMPHSABS 2,006 08/01/2016 1713   LYMPHSABS 1.8 07/12/2015 1521   MONOABS 354 08/01/2016 1713   MONOABS 0.7 07/12/2015 1521   EOSABS 118 08/01/2016 1713   EOSABS 0.1 07/12/2015 1521   BASOSABS 0 08/01/2016 1713   BASOSABS 0.0 07/12/2015 1521  No results found for: POCLITH, LITHIUM   No results found for: PHENYTOIN, PHENOBARB, VALPROATE, CBMZ   .res Assessment: Plan:    Danielle Bruce was seen today for follow-up, anxiety, depression and post-traumatic stress disorder.  Diagnoses and all orders for this visit:  Major depressive disorder, recurrent episode, moderate (HCC) -     methylphenidate (RITALIN) 10 MG tablet; Take 1 tablet (10 mg total) by mouth 2 (two) times daily with breakfast and lunch. -     fluvoxaMINE (LUVOX) 50 MG tablet; Take 1 tablet (50 mg total) by mouth 2  (two) times daily.  PTSD (post-traumatic stress disorder) -     fluvoxaMINE (LUVOX) 50 MG tablet; Take 1 tablet (50 mg total) by mouth 2 (two) times daily.  Generalized anxiety disorder -     fluvoxaMINE (LUVOX) 50 MG tablet; Take 1 tablet (50 mg total) by mouth 2 (two) times daily.  Panic disorder with agoraphobia -     fluvoxaMINE (LUVOX) 50 MG tablet; Take 1 tablet (50 mg total) by mouth 2 (two) times daily.  Attention deficit hyperactivity disorder (ADHD), combined type -     methylphenidate (RITALIN) 10 MG tablet; Take 1 tablet (10 mg total) by mouth 2 (two) times daily with breakfast and lunch. -     methylphenidate (RITALIN) 10 MG tablet; Take 1 tablet (10 mg total) by mouth 2 (two) times daily.  Insomnia due to mental condition -     traZODone (DESYREL) 50 MG tablet; TAKE 1 TABLET(50 MG) BY MOUTH AT BEDTIME  Mixed obsessional thoughts and acts -     fluvoxaMINE (LUVOX) 50 MG tablet; Take 1 tablet (50 mg total) by mouth 2 (two) times daily.  Low vitamin D level  Last year more obvious PTSD obvious has called into doubt the bipolar dix bc she's remained mood stable off the lamotrigine.    She has had symptoms consistent with hypomania in the past but there is now some question as to whether they were PTSD related rather than truly bipolar 2.  Her son has had treatment resistant depression with possible bipolar disorder and her daughter has been diagnosed with bipolar disorder but also a history of borderline personality disorder.  So there is a family history of the spectrum.  started Luvox again for it and for the anti-inflammatory potential for her health.  Has seen some benefit.   Prior benefit Taking fluvoxamine 50 mg BID Clear benefit from fluvoxamine for mood quite dramatically until the loss of the dog. No manic sx. Disc anti-inflammatory effects of fluvoxamine at doses of 100 mg BID for Covid.  2 studies proving benefit so far. HA are better.   Grief work over loss of dog  who was very close to her.  Walked her through grief.  She was every part of her life.  Some guilt feelings.  Disc therapeutic letter writing for grief. Stress management for mood sx. Health problems worsen mood episodically.   Disc stress son's mental health problems.  We discussed the short-term risks associated with benzodiazepines including sedation and increased fall risk among others.  Discussed long-term side effect risk including dependence, potential withdrawal symptoms, and the potential eventual dose-related risk of dementia.  Keep this at a minimum given cognitive concerns.  She only uses it when triggered with anxiety usually medical.  She is not using it regularly.  Discussed potential benefits, risks, and side effects of stimulants with patient to include increased heart rate, palpitations, insomnia, increased anxiety, increased irritability, or decreased appetite.  Instructed patient to contact office if experiencing any significant tolerability issues. Benefit so increase Ritalin 10 mg AM and noon for cognitive and ADD reasons and disc risk of palpitations.   Continue therapy.  She is working well on things.   Disc seasonal depression and loss of dog Inez Catalina in June. Rec check vitamin D level bc makes susceptible to seasonal depression, cognitive problems, viral illnesses  No med changes  FU 3 mos  Danielle Bruce Parents, MD, DFAPA    Please see After Visit Summary for patient specific instructions.  Future Appointments  Date Time Provider [place]  01/17/2021 11:00 AM Danie Binder, PT [facility] None  01/26/2021 11:00 AM Bo Merino, MD [facility] None  02/04/2021  3:00 PM Altamese Dilling, PTA [facility] None  02/07/2021 11:00 AM Danie Binder, PT [facility] None  02/10/2021  3:00 PM Cottle, Billey Co., MD [facility] None  02/25/2021  2:30 PM Altamese Dilling, PTA [facility] None  03/01/2021 11:00 AM Danie Binder, PT [facility] None  03/04/2021  2:30 PM Altamese Dilling, PTA [facility] None  03/11/2021  2:30 PM Altamese Dilling, PTA [facility] None  03/18/2021  2:30 PM Altamese Dilling, PTA [facility] None  03/21/2021 11:00 AM Danie Binder, PT [facility] None  03/25/2021  2:30 PM Altamese Dilling, PTA [facility] None    No orders of the defined types were placed in this encounter.     -------------------------------

## 2021-01-12 NOTE — Progress Notes (Deleted)
Office Visit Note  Patient: Danielle Bruce             Date of Birth: 1960-06-17           MRN: [id number]             PCP: Mayra Neer, MD Referring: Mayra Neer, MD Visit Date: 01/26/2021 Occupation: @GUAROCC @  Subjective:  No chief complaint on file.   History of Present Illness: Danielle Bruce is a 60 y.o. female ***   Activities of Daily Living:  Patient reports morning stiffness for *** {minute/hour:19697}.   Patient {ACTIONS;DENIES/REPORTS:21021675::"Denies"} nocturnal pain.  Difficulty dressing/grooming: {ACTIONS;DENIES/REPORTS:21021675::"Denies"} Difficulty climbing stairs: {ACTIONS;DENIES/REPORTS:21021675::"Denies"} Difficulty getting out of chair: {ACTIONS;DENIES/REPORTS:21021675::"Denies"} Difficulty using hands for taps, buttons, cutlery, and/or writing: {ACTIONS;DENIES/REPORTS:21021675::"Denies"}  No Rheumatology ROS completed.   PMFS History:  Patient Active Problem List   Diagnosis Date Noted   GAD (generalized anxiety disorder) 12/26/2017   OCD (obsessive compulsive disorder) 12/26/2017   PTSD (post-traumatic stress disorder) 12/26/2017   DDD (degenerative disc disease), cervical 07/14/2016   Primary osteoarthritis of both feet 07/14/2016   Primary osteoarthritis of both hands 07/14/2016   Other fatigue 07/14/2016   History of IBS 07/14/2016   Osteopenia of multiple sites 07/14/2016   Fibromyalgia 01/13/2016   MGUS (monoclonal gammopathy of unknown significance) 12/23/2013   Chronic cholecystitis without calculus 10/18/2012   Abdominal pain, unspecified site 10/18/2012   Nausea alone 10/18/2012   Unspecified constipation 10/18/2012   Depression 10/18/2012   Anxiety    ASCUS (atypical squamous cells of undetermined significance) on Pap smear    IUD    Hemorrhoids 11/29/2010   Abdominal pain, left upper quadrant 11/29/2010    Past Medical History:  Diagnosis Date   Anemia    Anxiety    Arthritis    osteoarthritis   ASCUS (atypical  squamous cells of undetermined significance) on Pap smear 05/06/05   NEG HIGH RISK HPV--C&B BIOPSY BENIGN 12/2005   Asthma    Bipolar 2 disorder ([place])    Cancer ([place])    skin cancer - basal cell   Colon polyps    Complication of anesthesia    anxious afterwards, will get headaches    Constipation    Depression    Fibromyalgia 10/2013   GERD (gastroesophageal reflux disease)    Hearing loss on left    Heart murmur    never had any problems   Hemorrhoids    High cholesterol    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   cytology negative   IBS (irritable bowel syndrome)    Insomnia    Lymphocytic colitis    Lymphoma ([facility])    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   Bone marrow biopsy showes 8% plasma cells IgA Lambda   Migraines    Osteoarthritis    Osteopenia    Peripheral neuropathy    PTSD (post-traumatic stress disorder)     Family History  Problem Relation Age of Onset   Diabetes Father    Hypertension Father    Hyperlipidemia Father    Heart disease Maternal Grandfather    Heart disease Paternal Grandmother    Heart disease Paternal Grandfather    Depression Son    Depression Son    Anxiety disorder Son    Depression Daughter    Anxiety disorder Daughter    Asthma Daughter    Past Surgical History:  Procedure Laterality Date   BONE MARROW BIOPSY Left 12/18/2013   Plasma cell dyscrasia 8% population of  plasma cells   BREAST BIOPSY Right    benign stereo   CESAREAN SECTION  93,81,01   CHOLECYSTECTOMY N/A 10/16/2012   Procedure: LAPAROSCOPIC CHOLECYSTECTOMY;  Surgeon: Joyice Faster. Cornett, MD;  Location: [facility];  Service: General;  Laterality: N/A;   COLONOSCOPY     numerous times   DILATION AND CURETTAGE OF UTERUS     ESOPHAGOGASTRODUODENOSCOPY     HEMORRHOID SURGERY  1993   x3   IUD REMOVAL  02/2015   Mirena   LAPAROSCOPIC LYSIS OF ADHESIONS N/A 10/16/2012   Procedure: LAPAROSCOPIC LYSIS OF ADHESIONS;  Surgeon: Marcello Moores A. Cornett, MD;   Location: [facility];  Service: General;  Laterality: N/A;   LAPAROSCOPY N/A 10/16/2012   Procedure: LAPAROSCOPY DIAGNOSTIC;  Surgeon: Joyice Faster. Cornett, MD;  Location: [facility];  Service: General;  Laterality: N/A;   PELVIC LAPAROSCOPY     RADIOLOGY WITH ANESTHESIA N/A 12/16/2015   Procedure: MRI OF BRAIN WITH AND WITHOUT CONTRAST;  Surgeon: Medication Radiologist, MD;  Location: [facility];  Service: Radiology;  Laterality: N/A;   SHOULDER SURGERY  2007/2008   SPINE SURGERY  2010   cervical   Social History   Social History Narrative   Not on file   Immunization History  Administered Date(s) Administered   Influenza-Unspecified 11/20/2013   PFIZER(Purple Top)SARS-COV-2 Vaccination 05/15/2019, 06/09/2019, 02/11/2020     Objective: Vital Signs: LMP 12/28/2010    Physical Exam   Musculoskeletal Exam: ***  CDAI Exam: CDAI Score: -- Patient Global: --; Provider Global: -- Swollen: --; Tender: -- Joint Exam 01/26/2021   No joint exam has been documented for this visit   There is currently no information documented on the homunculus. Go to the Rheumatology activity and complete the homunculus joint exam.  Investigation: No additional findings.  Imaging: DG Chest 2 View  Result Date: 12/29/2020 CLINICAL DATA:  Decreased breath sounds. EXAM: CHEST - 2 VIEW COMPARISON:  Chest radiograph dated 01/14/2018. FINDINGS: No focal consolidation, pleural effusion, pneumothorax. The cardiac silhouette is within normal limits. No acute osseous pathology. Cervical ACDF. IMPRESSION: No active cardiopulmonary disease. Electronically Signed   By: Anner Crete M.D.   On: 12/29/2020 01:51    Recent Labs: Lab Results  Component Value Date   WBC 5.9 08/01/2016   HGB 14.6 08/01/2016   PLT 254 08/01/2016   NA 142 08/01/2016   K 4.7 08/01/2016   CL 107 08/01/2016   CO2 23 08/01/2016   GLUCOSE 95 08/01/2016   BUN 14 08/01/2016   CREATININE 0.87 08/01/2016   BILITOT 0.7 08/01/2016   ALKPHOS 55  08/01/2016   AST 23 08/01/2016   ALT 26 08/01/2016   PROT 7.4 08/01/2016   ALBUMIN 4.8 08/01/2016   CALCIUM 10.0 08/01/2016   GFRAA 88 08/01/2016    Speciality Comments: No specialty comments available.  Procedures:  No procedures performed Allergies: Sulfa antibiotics, Gluten meal, Lactose intolerance (gi), Flagyl [metronidazole hcl], and Morphine and related   Assessment / Plan:     Visit Diagnoses: No diagnosis found.  Orders: No orders of the defined types were placed in this encounter.  No orders of the defined types were placed in this encounter.   Face-to-face time spent with patient was *** minutes. Greater than 50% of time was spent in counseling and coordination of care.  Follow-Up Instructions: No follow-ups on file.   Earnestine Mealing, CMA  Note - This record has been created using Editor, commissioning.  Chart creation errors have been sought, but may not  always  have been located. Such creation errors do not reflect on  the standard of medical care.

## 2021-01-14 DIAGNOSIS — J301 Allergic rhinitis due to pollen: Secondary | ICD-10-CM | POA: Diagnosis not present

## 2021-01-14 DIAGNOSIS — J3081 Allergic rhinitis due to animal (cat) (dog) hair and dander: Secondary | ICD-10-CM | POA: Diagnosis not present

## 2021-01-14 DIAGNOSIS — J3089 Other allergic rhinitis: Secondary | ICD-10-CM | POA: Diagnosis not present

## 2021-01-24 ENCOUNTER — Other Ambulatory Visit: Payer: Self-pay | Admitting: Family Medicine

## 2021-01-24 ENCOUNTER — Telehealth: Payer: Self-pay

## 2021-01-24 ENCOUNTER — Ambulatory Visit
Admission: RE | Admit: 2021-01-24 | Discharge: 2021-01-24 | Disposition: A | Discharge status: Home / Self Care | Payer: Medicare Other | Source: Ambulatory Visit | Attending: Family Medicine | Admitting: Family Medicine

## 2021-01-24 DIAGNOSIS — M542 Cervicalgia: Secondary | ICD-10-CM | POA: Diagnosis not present

## 2021-01-24 DIAGNOSIS — M545 Low back pain, unspecified: Secondary | ICD-10-CM | POA: Diagnosis not present

## 2021-01-24 DIAGNOSIS — W19XXXA Unspecified fall, initial encounter: Secondary | ICD-10-CM

## 2021-01-24 DIAGNOSIS — M2578 Osteophyte, vertebrae: Secondary | ICD-10-CM | POA: Diagnosis not present

## 2021-01-24 DIAGNOSIS — M549 Dorsalgia, unspecified: Secondary | ICD-10-CM | POA: Diagnosis not present

## 2021-01-24 DIAGNOSIS — Z981 Arthrodesis status: Secondary | ICD-10-CM | POA: Diagnosis not present

## 2021-01-24 NOTE — Telephone Encounter (Signed)
Patient called stating she fell over the weekend hitting her head, neck, and back.  Patient states EMS was called but unable to take her to [facility] because it was closed and [facility] was backed up.  Patient states she was told to contact her PCP to rule out a concussion and is planning to see them today, 01/24/21.  Patient was confused about the date of her appointment with Dr. Estanislado Pandy.

## 2021-01-24 NOTE — Telephone Encounter (Signed)
Attempted to contact the patient and left message for patient to call the office.  

## 2021-01-25 NOTE — Progress Notes (Signed)
Office Visit Note  Patient: Danielle Bruce             Date of Birth: Dec 19, 1960           MRN: [id number]             PCP: Mayra Neer, MD Referring: Mayra Neer, MD Visit Date: 02/03/2021 Occupation: @GUAROCC @  Subjective:  Back pain   History of Present Illness: Danielle Bruce is a 60 y.o. female with history of fibromyalgia, osteoarthritis, and DDD.  Patient reports that overall her fibromyalgia has been better controlled.  She has been going for massage or dry needling every 2 to 3 weeks which has been alleviating her symptoms.  She has also tried using capsaicin patches over-the-counter for pain relief.  She states that 2 weeks ago she had a fall and ended up being diagnosed with a concussion.  She states her headaches have been improving.  She has been having to take Advil for pain relief which is started to flareup her colitis.  She states that she continues to have some neck, thoracic, and lumbar spine discomfort.  She has been taking methocarbamol and Flexeril as needed for muscle spasms.  She will be starting water therapy tomorrow.   Activities of Daily Living:  Patient reports morning stiffness for 45 minutes.   Patient Reports nocturnal pain.  Difficulty dressing/grooming: Denies Difficulty climbing stairs: Reports Difficulty getting out of chair: Reports Difficulty using hands for taps, buttons, cutlery, and/or writing: Reports  Review of Systems  Constitutional:  Positive for fatigue.  HENT:  Positive for mouth dryness. Negative for mouth sores and nose dryness.   Eyes:  Negative for pain, visual disturbance and dryness.  Respiratory:  Negative for cough, hemoptysis and difficulty breathing.   Cardiovascular:  Positive for swelling in legs/feet. Negative for chest pain, palpitations and hypertension.  Gastrointestinal:  Positive for constipation and diarrhea. Negative for blood in stool.  Endocrine: Positive for cold intolerance, heat intolerance and excessive  thirst. Negative for increased urination.  Genitourinary:  Negative for difficulty urinating and painful urination.  Musculoskeletal:  Positive for joint pain, gait problem, joint pain, muscle weakness, morning stiffness and muscle tenderness. Negative for joint swelling, myalgias and myalgias.  Skin:  Negative for color change, pallor, rash, hair loss, skin tightness, ulcers and sensitivity to sunlight.  Allergic/Immunologic: Negative for susceptible to infections.  Neurological:  Negative for dizziness and headaches.  Hematological:  Positive for bruising/bleeding tendency. Negative for swollen glands.  Psychiatric/Behavioral:  Positive for sleep disturbance. Negative for depressed mood. The patient is not nervous/anxious.    PMFS History:  Patient Active Problem List   Diagnosis Date Noted   GAD (generalized anxiety disorder) 12/26/2017   OCD (obsessive compulsive disorder) 12/26/2017   PTSD (post-traumatic stress disorder) 12/26/2017   DDD (degenerative disc disease), cervical 07/14/2016   Primary osteoarthritis of both feet 07/14/2016   Primary osteoarthritis of both hands 07/14/2016   Other fatigue 07/14/2016   History of IBS 07/14/2016   Osteopenia of multiple sites 07/14/2016   Fibromyalgia 01/13/2016   MGUS (monoclonal gammopathy of unknown significance) 12/23/2013   Chronic cholecystitis without calculus 10/18/2012   Abdominal pain, unspecified site 10/18/2012   Nausea alone 10/18/2012   Unspecified constipation 10/18/2012   Depression 10/18/2012   Anxiety    ASCUS (atypical squamous cells of undetermined significance) on Pap smear    IUD    Hemorrhoids 11/29/2010   Abdominal pain, left upper quadrant 11/29/2010    Past Medical History:  Diagnosis Date   Anemia    Anxiety    Arthritis    osteoarthritis   ASCUS (atypical squamous cells of undetermined significance) on Pap smear 05/06/2005   NEG HIGH RISK HPV--C&B BIOPSY BENIGN 12/2005   Asthma    Bipolar 2 disorder  (H[facility])    Cancer (H[facility])    skin cancer - basal cell   Colon polyps    Complication of anesthesia    anxious afterwards, will get headaches    Constipation    Depression    Fibromyalgia 10/2013   GERD (gastroesophageal reflux disease)    Hearing loss on left    Heart murmur    never had any problems   Hemorrhoids    High cholesterol    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   cytology negative   IBS (irritable bowel syndrome)    Insomnia    Lymphocytic colitis    Lymphoma (HCC)    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   Bone marrow biopsy showes 8% plasma cells IgA Lambda   Migraines    Osteoarthritis    Osteopenia    Peripheral neuropathy    PTSD (post-traumatic stress disorder)     Family History  Problem Relation Age of Onset   Diabetes Father    Hypertension Father    Hyperlipidemia Father    Heart disease Maternal Grandfather    Heart disease Paternal Grandmother    Heart disease Paternal Grandfather    Depression Son    Depression Son    Anxiety disorder Son    Depression Daughter    Anxiety disorder Daughter    Asthma Daughter    Past Surgical History:  Procedure Laterality Date   BONE MARROW BIOPSY Left 12/18/2013   Plasma cell dyscrasia 8% population of plasma cells   BREAST BIOPSY Right    benign stereo   CESAREAN SECTION  56,31,49   CHOLECYSTECTOMY N/A 10/16/2012   Procedure: LAPAROSCOPIC CHOLECYSTECTOMY;  Surgeon: Joyice Faster. Cornett, MD;  Location: [facility];  Service: General;  Laterality: N/A;   COLONOSCOPY     numerous times   DILATION AND CURETTAGE OF UTERUS     ESOPHAGOGASTRODUODENOSCOPY     HEMORRHOID SURGERY  1993   x3   IUD REMOVAL  02/2015   Mirena   LAPAROSCOPIC LYSIS OF ADHESIONS N/A 10/16/2012   Procedure: LAPAROSCOPIC LYSIS OF ADHESIONS;  Surgeon: Marcello Moores A. Cornett, MD;  Location: [facility];  Service: General;  Laterality: N/A;   LAPAROSCOPY N/A 10/16/2012   Procedure: LAPAROSCOPY DIAGNOSTIC;  Surgeon: Joyice Faster. Cornett, MD;   Location: [facility];  Service: General;  Laterality: N/A;   PELVIC LAPAROSCOPY     RADIOLOGY WITH ANESTHESIA N/A 12/16/2015   Procedure: MRI OF BRAIN WITH AND WITHOUT CONTRAST;  Surgeon: Medication Radiologist, MD;  Location: [facility];  Service: Radiology;  Laterality: N/A;   SHOULDER SURGERY  2007/2008   SPINE SURGERY  2010   cervical   Social History   Social History Narrative   Not on file   Immunization History  Administered Date(s) Administered   Influenza-Unspecified 11/20/2013   PFIZER(Purple Top)SARS-COV-2 Vaccination 05/15/2019, 06/09/2019, 02/11/2020     Objective: Vital Signs: BP 103/64 (BP Location: Left Arm, Patient Position: Sitting, Cuff Size: Normal)   Pulse 99   Resp 15   Ht 5' 2"  (1.575 m)   Wt 143 lb (64.9 kg)   LMP 12/28/2010   BMI 26.16 kg/m    Physical Exam Vitals and nursing note reviewed.  Constitutional:  Appearance: She is well-developed.  HENT:     Head: Normocephalic and atraumatic.  Eyes:     Conjunctiva/sclera: Conjunctivae normal.  Pulmonary:     Effort: Pulmonary effort is normal.  Abdominal:     Palpations: Abdomen is soft.  Musculoskeletal:     Cervical back: Normal range of motion.  Skin:    General: Skin is warm and dry.     Capillary Refill: Capillary refill takes less than 2 seconds.  Neurological:     Mental Status: She is alert and oriented to person, place, and time.  Psychiatric:        Behavior: Behavior normal.     Musculoskeletal Exam: Generalized hyperalgesia and positive tender points. C-spine has limited ROM with lateral rotation.  Trapezius muscle tension and tenderness bilaterally.  Midline spinal tenderness in the lumbar region. Shoulder joints, elbow joints, wrist joints, MCPs, PIPs, and DIPs good ROM with no synovitis.  Complete fist formation bilaterally.  Hip joints, knee joints, and ankle joints have good ROM with no discomfort.  No warmth or effusion of knee joints.  No tenderness or swelling of ankle  joints.  CDAI Exam: CDAI Score: -- Patient Global: --; Provider Global: -- Swollen: --; Tender: -- Joint Exam 02/03/2021   No joint exam has been documented for this visit   There is currently no information documented on the homunculus. Go to the Rheumatology activity and complete the homunculus joint exam.  Investigation: No additional findings.  Imaging: DG Cervical Spine 2 or 3 views  Result Date: 01/26/2021 CLINICAL DATA:  Fall.  Pain and tenderness. EXAM: CERVICAL SPINE - 2-3 VIEW COMPARISON:  Cervical spine series images 09/17/2013. CT cervical spine 02/23/2011. FINDINGS: C5-C6 anterior and interbody fusion again noted. Hardware intact. Anatomic alignment. No acute bony abnormality identified. Pulmonary apices are clear. IMPRESSION: C5-C6 anterior and interbody fusion again noted. Hardware intact. Anatomic alignment. No acute bony abnormality identified. Electronically Signed   By: Marcello Moores  Register M.D.   On: 01/26/2021 10:00   DG Thoracic Spine W/Swimmers  Result Date: 01/26/2021 CLINICAL DATA:  Fall, initial encounter. Pain and tenderness after fall 2 days ago. Neck, mid, low back pain. Question fracture. EXAM: THORACIC SPINE - 3 VIEWS COMPARISON:  None. FINDINGS: The twelfth ribs are diminutive. The alignment is maintained. Vertebral body heights are maintained. No evidence of fracture. Slight endplate spurring in the midthoracic spine. No significant disc space narrowing. Posterior elements appear intact. There is no paravertebral soft tissue abnormality. IMPRESSION: No fracture of the thoracic spine. Slight midthoracic endplate spurring. Electronically Signed   By: Keith Rake M.D.   On: 01/26/2021 10:01   DG Lumbar Spine 2-3 Views  Result Date: 01/26/2021 CLINICAL DATA:  Fall, initial encounter. Pain and tenderness after fall 2 days ago. Neck, mid, low back pain. Question fracture. EXAM: LUMBAR SPINE - 2-3 VIEW COMPARISON:  None. FINDINGS: The alignment is maintained.  Vertebral body heights are normal. There is no listhesis. The posterior elements are intact. Slight disc space narrowing at L1-L2, remaining disc spaces are preserved. No fracture. Facet hypertrophy at L4-L5 and to a lesser extent L5-S1. Sacroiliac joints are congruent. IMPRESSION: 1. No fracture or subluxation of the lumbar spine. 2. Mild degenerative change. Electronically Signed   By: Keith Rake M.D.   On: 01/26/2021 10:03    Recent Labs: Lab Results  Component Value Date   WBC 5.9 08/01/2016   HGB 14.6 08/01/2016   PLT 254 08/01/2016   NA 142 08/01/2016   K 4.7  08/01/2016   CL 107 08/01/2016   CO2 23 08/01/2016   GLUCOSE 95 08/01/2016   BUN 14 08/01/2016   CREATININE 0.87 08/01/2016   BILITOT 0.7 08/01/2016   ALKPHOS 55 08/01/2016   AST 23 08/01/2016   ALT 26 08/01/2016   PROT 7.4 08/01/2016   ALBUMIN 4.8 08/01/2016   CALCIUM 10.0 08/01/2016   GFRAA 88 08/01/2016    Speciality Comments: No specialty comments available.  Procedures:  No procedures performed Allergies: Sulfa antibiotics, Gluten meal, Lactose intolerance (gi), Aspirin, Bromfed dm [pseudoeph-bromphen-dm], Chlorpromazine, Desipramine, Gabapentin, Hydrocodone, Lamotrigine, Metronidazole, Morphine sulfate, Nsaids, Flagyl [metronidazole hcl], and Morphine and related   Assessment / Plan:     Visit Diagnoses: Fibromyalgia: She experiences intermittent myalgias and muscle tenderness due to fibromyalgia.  Overall her symptoms have been better controlled.  She has been getting dry needling and massages every 2 to 3 weeks for symptomatic relief.  She had a fall 2 weeks ago which time she had a concussion.  She continues to have some persistent neck, thoracic, and lower back pain.  She has been experiencing muscle spasms intermittently and has been taking methocarbamol and Flexeril as needed for muscle spasms.  She will be starting water therapy tomorrow which I think she will greatly benefit from.  I discussed the  importance of regular exercise and good sleep hygiene.  She would benefit from transitioning to water aerobics in the future as well as practicing yoga.  She will remain on Lyrica as prescribed.  She was advised to notify us if she starts to have more frequent or severe flares.  She will follow-up in the office in 6 months.  Other fatigue: Stable. Discussed the importance of regular exercise.  She is planning on getting a [employer] or [employer].   Other insomnia - She takes trazodone 50 mg 1 tablet by mouth at bedtime and Flexeril 10 mg 1 tablet by mouth at bedtime as needed.  Discussed the importance of good sleep hygiene.  Primary osteoarthritis of both hands: She has PIP and DIP thickening consistent with osteoarthritis of both hands.  No tenderness or inflammation noted on examination.  Complete fist formation bilaterally.  Discussed the importance of joint protection and muscle strengthening.  Primary osteoarthritis of both feet: She is not experiencing any increased discomfort in her feet at this time.  She is wearing proper fitting shoes.  Trochanteric bursitis of both hips: She has tenderness to palpation over bilateral trochanteric bursa.  Discussed the importance of performing stretching exercises daily.  DDD (degenerative disc disease), cervical: S/p fusion C5-C6.  She had a recent fall 2 weeks ago and was evaluated at her primary care office on 01/24/2021.  X-rays of the C-spine were updated at that time: Hardware was intact.  No acute bony abnormality was identified.   She has ongoing trapezius muscle tension and tenderness bilaterally.  She has limited ROM with lateral rotation.  No symptoms of radiculopathy at this time.  DDD (degenerative disc disease), lumbar: She continues to experience discomfort in her lower back.  She had a recent exacerbation of lower back pain after a fall 2 weeks ago.  She had updated x-rays of the lumbar spine on 01/24/2021 which do not reveal any  fracture or subluxation.  Mild degenerative change was noted.  Discussed the importance of core strengthening.  She will be starting water therapy tomorrow.  Osteopenia of multiple sites - DEXA not available in Epic.  Other medical conditions are listed as follows:  Chronic cholecystitis without calculus  History of posttraumatic stress disorder (PTSD)  Lymphocytic colitis  History of IBS  Bipolar 2 disorder (HCC)  MGUS (monoclonal gammopathy of unknown significance)  DDD (degenerative disc disease), lumbar  Orders: No orders of the defined types were placed in this encounter.  No orders of the defined types were placed in this encounter.    Follow-Up Instructions: Return in about 6 months (around 08/04/2021) for Fibromyalgia, Osteoarthritis, DDD.   Ofilia Neas, PA-C  Note - This record has been created using Dragon software.  Chart creation errors have been sought, but may not always  have been located. Such creation errors do not reflect on  the standard of medical care.

## 2021-01-26 ENCOUNTER — Ambulatory Visit: Payer: Medicare Other | Admitting: Rheumatology

## 2021-01-26 DIAGNOSIS — M19041 Primary osteoarthritis, right hand: Secondary | ICD-10-CM

## 2021-01-26 DIAGNOSIS — R5383 Other fatigue: Secondary | ICD-10-CM

## 2021-01-26 DIAGNOSIS — M7062 Trochanteric bursitis, left hip: Secondary | ICD-10-CM

## 2021-01-26 DIAGNOSIS — K811 Chronic cholecystitis: Secondary | ICD-10-CM

## 2021-01-26 DIAGNOSIS — M5030 Other cervical disc degeneration, unspecified cervical region: Secondary | ICD-10-CM

## 2021-01-26 DIAGNOSIS — M797 Fibromyalgia: Secondary | ICD-10-CM

## 2021-01-26 DIAGNOSIS — Z8719 Personal history of other diseases of the digestive system: Secondary | ICD-10-CM

## 2021-01-26 DIAGNOSIS — M8589 Other specified disorders of bone density and structure, multiple sites: Secondary | ICD-10-CM

## 2021-01-26 DIAGNOSIS — K52832 Lymphocytic colitis: Secondary | ICD-10-CM

## 2021-01-26 DIAGNOSIS — D472 Monoclonal gammopathy: Secondary | ICD-10-CM

## 2021-01-26 DIAGNOSIS — M19072 Primary osteoarthritis, left ankle and foot: Secondary | ICD-10-CM

## 2021-01-26 DIAGNOSIS — F3181 Bipolar II disorder: Secondary | ICD-10-CM

## 2021-01-26 DIAGNOSIS — Z8659 Personal history of other mental and behavioral disorders: Secondary | ICD-10-CM

## 2021-01-26 DIAGNOSIS — G4709 Other insomnia: Secondary | ICD-10-CM

## 2021-01-26 NOTE — Telephone Encounter (Signed)
Attempted to contact the patient and left message for patient to call the office.  

## 2021-02-03 ENCOUNTER — Encounter: Payer: Self-pay | Admitting: Physician Assistant

## 2021-02-03 ENCOUNTER — Other Ambulatory Visit: Payer: Self-pay

## 2021-02-03 ENCOUNTER — Ambulatory Visit (INDEPENDENT_AMBULATORY_CARE_PROVIDER_SITE_OTHER): Payer: Medicare Other | Admitting: Physician Assistant

## 2021-02-03 VITALS — BP 103/64 | HR 99 | Resp 15 | Ht 62.0 in | Wt 143.0 lb

## 2021-02-03 DIAGNOSIS — Z8719 Personal history of other diseases of the digestive system: Secondary | ICD-10-CM

## 2021-02-03 DIAGNOSIS — M19041 Primary osteoarthritis, right hand: Secondary | ICD-10-CM | POA: Diagnosis not present

## 2021-02-03 DIAGNOSIS — M19042 Primary osteoarthritis, left hand: Secondary | ICD-10-CM

## 2021-02-03 DIAGNOSIS — G4709 Other insomnia: Secondary | ICD-10-CM | POA: Diagnosis not present

## 2021-02-03 DIAGNOSIS — M7061 Trochanteric bursitis, right hip: Secondary | ICD-10-CM | POA: Diagnosis not present

## 2021-02-03 DIAGNOSIS — M5136 Other intervertebral disc degeneration, lumbar region: Secondary | ICD-10-CM

## 2021-02-03 DIAGNOSIS — Z8659 Personal history of other mental and behavioral disorders: Secondary | ICD-10-CM | POA: Diagnosis not present

## 2021-02-03 DIAGNOSIS — M797 Fibromyalgia: Secondary | ICD-10-CM | POA: Diagnosis not present

## 2021-02-03 DIAGNOSIS — R5383 Other fatigue: Secondary | ICD-10-CM

## 2021-02-03 DIAGNOSIS — M5030 Other cervical disc degeneration, unspecified cervical region: Secondary | ICD-10-CM

## 2021-02-03 DIAGNOSIS — K52832 Lymphocytic colitis: Secondary | ICD-10-CM | POA: Diagnosis not present

## 2021-02-03 DIAGNOSIS — M19071 Primary osteoarthritis, right ankle and foot: Secondary | ICD-10-CM | POA: Diagnosis not present

## 2021-02-03 DIAGNOSIS — K811 Chronic cholecystitis: Secondary | ICD-10-CM

## 2021-02-03 DIAGNOSIS — M7062 Trochanteric bursitis, left hip: Secondary | ICD-10-CM

## 2021-02-03 DIAGNOSIS — M19072 Primary osteoarthritis, left ankle and foot: Secondary | ICD-10-CM

## 2021-02-03 DIAGNOSIS — M8589 Other specified disorders of bone density and structure, multiple sites: Secondary | ICD-10-CM

## 2021-02-03 DIAGNOSIS — F3181 Bipolar II disorder: Secondary | ICD-10-CM

## 2021-02-03 DIAGNOSIS — D472 Monoclonal gammopathy: Secondary | ICD-10-CM

## 2021-02-04 ENCOUNTER — Ambulatory Visit: Payer: Medicare Other | Admitting: Physical Therapy

## 2021-02-04 ENCOUNTER — Ambulatory Visit: Payer: Medicare Other | Attending: Family Medicine | Admitting: Physical Therapy

## 2021-02-04 DIAGNOSIS — M353 Polymyalgia rheumatica: Secondary | ICD-10-CM | POA: Insufficient documentation

## 2021-02-04 DIAGNOSIS — R293 Abnormal posture: Secondary | ICD-10-CM | POA: Insufficient documentation

## 2021-02-04 DIAGNOSIS — M6281 Muscle weakness (generalized): Secondary | ICD-10-CM | POA: Insufficient documentation

## 2021-02-04 DIAGNOSIS — R252 Cramp and spasm: Secondary | ICD-10-CM | POA: Insufficient documentation

## 2021-02-07 ENCOUNTER — Other Ambulatory Visit: Payer: Self-pay

## 2021-02-07 ENCOUNTER — Ambulatory Visit: Payer: Medicare Other

## 2021-02-07 DIAGNOSIS — M6281 Muscle weakness (generalized): Secondary | ICD-10-CM

## 2021-02-07 DIAGNOSIS — M353 Polymyalgia rheumatica: Secondary | ICD-10-CM

## 2021-02-07 DIAGNOSIS — R293 Abnormal posture: Secondary | ICD-10-CM

## 2021-02-07 DIAGNOSIS — R252 Cramp and spasm: Secondary | ICD-10-CM | POA: Diagnosis not present

## 2021-02-07 NOTE — Therapy (Signed)
[name], [place], [address] Phone: [phone]   Fax:  [phone]  Physical Therapy Treatment  Patient Details  Name: Danielle Bruce MRN: [id number] Date of Birth: Aug 26, 1960 Referring Provider (PT): Mayra Neer, MD   Encounter Date: 02/07/2021   PT End of Session - 02/07/21 1143     Visit Number 29    Date for PT Re-Evaluation 04/04/21    Authorization Type Medicare/Medicaid- KX needed    PT Start Time 1102    PT Stop Time 1143    PT Time Calculation (min) 41 min    Activity Tolerance Patient tolerated treatment well    Behavior During Therapy [facility] for tasks assessed/performed             Past Medical History:  Diagnosis Date   Anemia    Anxiety    Arthritis    osteoarthritis   ASCUS (atypical squamous cells of undetermined significance) on Pap smear 05/06/2005   NEG HIGH RISK HPV--C&B BIOPSY BENIGN 12/2005   Asthma    Bipolar 2 disorder ([place])    Cancer ([place])    skin cancer - basal cell   Colon polyps    Complication of anesthesia    anxious afterwards, will get headaches    Constipation    Depression    Fibromyalgia 10/2013   GERD (gastroesophageal reflux disease)    Hearing loss on left    Heart murmur    never had any problems   Hemorrhoids    High cholesterol    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   cytology negative   IBS (irritable bowel syndrome)    Insomnia    Lymphocytic colitis    Lymphoma ([place])    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   Bone marrow biopsy showes 8% plasma cells IgA Lambda   Migraines    Osteoarthritis    Osteopenia    Peripheral neuropathy    PTSD (post-traumatic stress disorder)     Past Surgical History:  Procedure Laterality Date   BONE MARROW BIOPSY Left 12/18/2013   Plasma cell dyscrasia 8% population of plasma cells   BREAST BIOPSY Right    benign stereo   CESAREAN SECTION  15,17,61   CHOLECYSTECTOMY  N/A 10/16/2012   Procedure: LAPAROSCOPIC CHOLECYSTECTOMY;  Surgeon: Joyice Faster. Cornett, MD;  Location: [facility];  Service: General;  Laterality: N/A;   COLONOSCOPY     numerous times   DILATION AND CURETTAGE OF UTERUS     ESOPHAGOGASTRODUODENOSCOPY     HEMORRHOID SURGERY  1993   x3   IUD REMOVAL  02/2015   Mirena   LAPAROSCOPIC LYSIS OF ADHESIONS N/A 10/16/2012   Procedure: LAPAROSCOPIC LYSIS OF ADHESIONS;  Surgeon: Marcello Moores A. Cornett, MD;  [facility];  Service: General;  Laterality: N/A;   LAPAROSCOPY N/A 10/16/2012   Procedure: LAPAROSCOPY DIAGNOSTIC;  Surgeon: Joyice Faster. Cornett, MD;  [facility];  Service: General;  Laterality: N/A;   PELVIC LAPAROSCOPY     RADIOLOGY WITH ANESTHESIA N/A 12/16/2015   Procedure: MRI OF BRAIN WITH AND WITHOUT CONTRAST;  Surgeon: Medication Radiologist, MD;  Location: [facility];  Service: Radiology;  Laterality: N/A;   SHOULDER SURGERY  2007/2008   SPINE SURGERY  2010   cervical    There were no vitals filed for this visit.   Subjective Assessment - 02/07/21 1058     Subjective I haven't been here due to being sick, having a concussion  and my mom is in the hostpital.  I missed my pool appt because I had to be at the hospital with my mom.    Pertinent History goes by "Selinda Bruce" ; OA bilateral feet; fibromyalgia; off cycle of steriod medicine currenlty    Patient Stated Goals Pt wants pain management and be able to move more without taking medication.    Currently in Pain? Yes    Pain Score 4     Pain Location Back   neck   Pain Orientation Left;Right;Mid    Pain Descriptors / Indicators Tightness;Aching    Pain Type Chronic pain    Pain Onset More than a month ago    Pain Frequency Constant    Aggravating Factors  being too still, walking too long, activity, stress    Pain Relieving Factors rest, stretching, pain patches                [facility] PT Assessment - 02/07/21 0001       Assessment   Medical Diagnosis M77.41,M77.42 (ICD-10-CM) -  Metatarsalgia of both feet; M35.3 (ICD-10-CM) - Polymyalgia rheumatica    Referring Provider (PT) Mayra Neer, MD    Onset Date/Surgical Date 07/29/19    Hand Dominance Right      Cognition   Overall Cognitive Status Within Functional Limits for tasks assessed      Posture/Postural Control   Posture Comments NDI: 46% disability   didn't retest- pt has not attended PT since last reassessment                          [facility] Adult PT Treatment/Exercise - 02/07/21 0001       Lumbar Exercises: Stretches   Active Hamstring Stretch 3 reps;20 seconds    Single Knee to Chest Stretch 3 reps;20 seconds    Lower Trunk Rotation 3 reps;20 seconds      Manual Therapy   Manual Therapy Soft tissue mobilization;Myofascial release    Manual therapy comments elongation to bil neck and lumbar spine.                       PT Short Term Goals - 03/29/20 1030       PT SHORT TERM GOAL #1   Title be independent in initial HEP    Status Achieved               PT Long Term Goals - 02/07/21 1104       PT LONG TERM GOAL #1   Title be independent in advanced HEP with completion 4-5x/wk for 5-10 each    Time 8    Period Weeks    Status On-going    Target Date 04/04/21      PT LONG TERM GOAL #2   Title Pt will be able to walk at least 1 mile without increased pain so she can return to [place].      PT LONG TERM GOAL #3   Title report 75% less neck/UE pain throughout the day with normal functional activities.    Baseline not as painful and can do more stuff- 50-75% better-    Time 8    Period Weeks    Status On-going    Target Date 04/04/21      PT LONG TERM GOAL #4   Title initiate a gym exercise program and participate > or = to 1x/wk    Baseline unable to complete due to being sick  and concussion.  Pt will begin with aquatic PT-has not been able to start yet.    Time 8    Period Weeks    Status On-going    Target Date 04/04/21      PT LONG TERM  GOAL #5   Title Pt will demonstrate at least hip strength of 4+/5 for  improved stability for walking and lifting her grandchildren    Baseline 4/5 hip strength on Lt side    Time 8    Period Weeks    Status On-going    Target Date 04/04/21      PT LONG TERM GOAL #6   Title improve NDI to < or = to 30% disability    Baseline 46%- no change due to not attending PT    Time 8    Status On-going    Target Date 04/04/21                   Plan - 02/07/21 1117     Clinical Impression Statement Pt with lapse in treatment since 12/27/20 and had a concussion due to fall.  Pt arrived with stress due and session focused on gentle stretching and manual work to address widespread pain. Pt has been able to do some stretching and this has been inconsistent.  Pt has been helping a lot with her 45 young grandchildren, so this has kept her busy and active.   Pt has not been walking.  She is interested and motivated to initiate aquatic PT and a regular gym program.  Pt has multiple medical conditions that will make her progress slow and will be a good candidate for aquatics and regular activity.  Pt will benefit from manual therapy every 3 weeks and aquatics 1x/wk.    PT Frequency 2x / week    PT Duration 8 weeks    PT Treatment/Interventions ADLs/Self Care Home Management;Cryotherapy;Electrical Stimulation;Iontophoresis 35m/ml Dexamethasone;Moist Heat;Ultrasound;Neuromuscular re-education;Therapeutic exercise;Therapeutic activities;Patient/family education;Manual techniques;Dry needling;Passive range of motion;Taping;Aquatic Therapy    PT Next Visit Plan Pool next.  KX modifier, 30th visit    PT Home Exercise Plan Access Code: [email or web address]    Recommended Other Services recert sent 194/58/59   Consulted and Agree with Plan of Care Patient             Patient will benefit from skilled therapeutic intervention in order to improve the following deficits and impairments:  Abnormal gait, Increased  fascial restricitons, Difficulty walking, Decreased range of motion, Pain, Decreased strength, Postural dysfunction  Visit Diagnosis: [place]a [place] - Plan: PT plan of care cert/re-cert  Cramp and spasm - Plan: PT plan of care cert/re-cert  Muscle weakness (generalized) - Plan: PT plan of care cert/re-cert  Abnormal posture - Plan: PT plan of care cert/re-cert     Problem List Patient Active Problem List   Diagnosis Date Noted   GAD (generalized anxiety disorder) 12/26/2017   OCD (obsessive compulsive disorder) 12/26/2017   PTSD (post-traumatic stress disorder) 12/26/2017   DDD (degenerative disc disease), cervical 07/14/2016   Primary osteoarthritis of both feet 07/14/2016   Primary osteoarthritis of both hands 07/14/2016   Other fatigue 07/14/2016   History of IBS 07/14/2016   Osteopenia of multiple sites 07/14/2016   Fibromyalgia 01/13/2016   MGUS (monoclonal gammopathy of unknown significance) 12/23/2013   Chronic cholecystitis without calculus 10/18/2012   Abdominal pain, unspecified site 10/18/2012   Nausea alone 10/18/2012   Unspecified constipation 10/18/2012   Depression 10/18/2012   Anxiety  ASCUS (atypical squamous cells of undetermined significance) on Pap smear    IUD    Hemorrhoids 11/29/2010   Abdominal pain, left upper quadrant 11/29/2010   Sigurd Sos, PT 02/07/21 11:46 AM   [facility] @ [place], [place], [address] Phone: [phone]   Fax:  [phone]  Name: GERALDINA PARROTT MRN: [id number] Date of Birth: 09-04-1960

## 2021-02-08 DIAGNOSIS — K5289 Other specified noninfective gastroenteritis and colitis: Secondary | ICD-10-CM | POA: Diagnosis not present

## 2021-02-08 DIAGNOSIS — F3181 Bipolar II disorder: Secondary | ICD-10-CM | POA: Diagnosis not present

## 2021-02-08 DIAGNOSIS — R7301 Impaired fasting glucose: Secondary | ICD-10-CM | POA: Diagnosis not present

## 2021-02-08 DIAGNOSIS — G43909 Migraine, unspecified, not intractable, without status migrainosus: Secondary | ICD-10-CM | POA: Diagnosis not present

## 2021-02-08 DIAGNOSIS — K219 Gastro-esophageal reflux disease without esophagitis: Secondary | ICD-10-CM | POA: Diagnosis not present

## 2021-02-08 DIAGNOSIS — F900 Attention-deficit hyperactivity disorder, predominantly inattentive type: Secondary | ICD-10-CM | POA: Diagnosis not present

## 2021-02-08 DIAGNOSIS — E7800 Pure hypercholesterolemia, unspecified: Secondary | ICD-10-CM | POA: Diagnosis not present

## 2021-02-08 DIAGNOSIS — F411 Generalized anxiety disorder: Secondary | ICD-10-CM | POA: Diagnosis not present

## 2021-02-08 DIAGNOSIS — F4310 Post-traumatic stress disorder, unspecified: Secondary | ICD-10-CM | POA: Diagnosis not present

## 2021-02-08 DIAGNOSIS — D472 Monoclonal gammopathy: Secondary | ICD-10-CM | POA: Diagnosis not present

## 2021-02-08 DIAGNOSIS — Z0000 Encounter for general adult medical examination without abnormal findings: Secondary | ICD-10-CM | POA: Diagnosis not present

## 2021-02-08 DIAGNOSIS — M797 Fibromyalgia: Secondary | ICD-10-CM | POA: Diagnosis not present

## 2021-02-10 ENCOUNTER — Other Ambulatory Visit: Payer: Self-pay | Admitting: Family Medicine

## 2021-02-10 ENCOUNTER — Ambulatory Visit: Payer: Medicare Other | Admitting: Psychiatry

## 2021-02-10 DIAGNOSIS — M810 Age-related osteoporosis without current pathological fracture: Secondary | ICD-10-CM

## 2021-02-16 ENCOUNTER — Ambulatory Visit: Payer: Medicare Other | Admitting: Physical Therapy

## 2021-02-25 ENCOUNTER — Ambulatory Visit: Payer: Medicare Other | Admitting: Physical Therapy

## 2021-02-25 DIAGNOSIS — Z03818 Encounter for observation for suspected exposure to other biological agents ruled out: Secondary | ICD-10-CM | POA: Diagnosis not present

## 2021-02-25 DIAGNOSIS — D472 Monoclonal gammopathy: Secondary | ICD-10-CM | POA: Diagnosis not present

## 2021-02-25 DIAGNOSIS — R059 Cough, unspecified: Secondary | ICD-10-CM | POA: Diagnosis not present

## 2021-02-25 DIAGNOSIS — J069 Acute upper respiratory infection, unspecified: Secondary | ICD-10-CM | POA: Diagnosis not present

## 2021-02-25 DIAGNOSIS — J029 Acute pharyngitis, unspecified: Secondary | ICD-10-CM | POA: Diagnosis not present

## 2021-02-25 DIAGNOSIS — J45909 Unspecified asthma, uncomplicated: Secondary | ICD-10-CM | POA: Diagnosis not present

## 2021-02-25 DIAGNOSIS — H6690 Otitis media, unspecified, unspecified ear: Secondary | ICD-10-CM | POA: Diagnosis not present

## 2021-03-01 ENCOUNTER — Ambulatory Visit: Payer: Medicare Other | Attending: Family Medicine

## 2021-03-01 ENCOUNTER — Other Ambulatory Visit: Payer: Self-pay

## 2021-03-01 DIAGNOSIS — R252 Cramp and spasm: Secondary | ICD-10-CM | POA: Insufficient documentation

## 2021-03-01 DIAGNOSIS — M353 Polymyalgia rheumatica: Secondary | ICD-10-CM | POA: Diagnosis not present

## 2021-03-01 DIAGNOSIS — M6281 Muscle weakness (generalized): Secondary | ICD-10-CM | POA: Diagnosis not present

## 2021-03-01 NOTE — Patient Instructions (Addendum)
Access Code: [email or web address] URL: [email or web address] Date: 03/01/2021 Prepared by: Claiborne Billings   Seated Hamstring Stretch - 2 x daily - 7 x weekly - 1 sets - 3 reps - 20 hold Supine Lower Trunk Rotation - 1 x daily - 7 x weekly - 3 sets - 10 reps Seated Figure 4 Piriformis Stretch - 2 x daily - 7 x weekly - 1 sets - 3 reps - 20 hold Prone Quadriceps Stretch with Strap - 2 x daily - 7 x weekly - 1 sets - 3 reps - 20 hold      [facility] Welcome to [facility]! Here you will find all the information you will need regarding your pool therapy. If you have further questions at any time, please call our office at [phone]. After completing your initial evaluation in the [facility] clinic, you may be eligible to complete a portion of your therapy in the pool. A typical week of therapy will consist of 1-2 typical physical therapy visits at our [place] location and an additional session of therapy in the pool located at the [facility] at [facility]. [address] [place]. The phone number at the pool site is [phone]. Please call this number if you are running late or need to cancel your appointment.  Aquatic therapy will be offered on Wednesday mornings and Friday afternoons. Each session will last approximately 45 minutes. All scheduling and payments for aquatic therapy sessions, including cancelations, will be done through our [place] location.  To be eligible for aquatic therapy, these criteria must be met: You must be able to independently change in the locker room and get to the pool deck. A caregiver can come with you to help if needed. There are benches for a caregiver to sit on next to the pool. No one with an open wound is permitted in the pool.  Handicap parking is available in the front and there is a drop off option for even closer accessibility. Please arrive 15 minutes prior to your appointment  to prepare for your pool session. You must sign in at the front desk upon your arrival. Please be sure to attend to any toileting needs prior to entering the pool. [place] rooms for changing are available.  There is direct access to the pool deck from the locker room. You can lock your belongings in a locker or bring them with you poolside. Your therapist will greet you on the pool deck. There may be other swimmers in the pool at the same time but your session is one-on-one with the therapist.

## 2021-03-01 NOTE — Therapy (Signed)
[facility] @ [place] [place], [place], [address] Phone: [phone]   Fax:  [phone]  Physical Therapy Treatment  Patient Details  Name: Danielle Bruce MRN: [id number] Date of Birth: 07/03/1960 Referring Provider (PT): Mayra Neer, MD   Encounter Date: 03/01/2021 Progress Note Reporting Period 07/28/20 to 03/01/21  See note below for Objective Data and Assessment of Progress/Goals.      PT End of Session - 03/01/21 1146     Visit Number 30    Date for PT Re-Evaluation 04/04/21    Authorization Type Medicare/Medicaid- KX needed at 76 for 2023    Progress Note Due on Visit 40    PT Start Time 1102    PT Stop Time 1150    PT Time Calculation (min) 48 min    Activity Tolerance Patient tolerated treatment well    Behavior During Therapy [facility] for tasks assessed/performed             Past Medical History:  Diagnosis Date   Anemia    Anxiety    Arthritis    osteoarthritis   ASCUS (atypical squamous cells of undetermined significance) on Pap smear 05/06/2005   NEG HIGH RISK HPV--C&B BIOPSY BENIGN 12/2005   Asthma    Bipolar 2 disorder ([place])    Cancer ([place])    skin cancer - basal cell   Colon polyps    Complication of anesthesia    anxious afterwards, will get headaches    Constipation    Depression    Fibromyalgia 10/2013   GERD (gastroesophageal reflux disease)    Hearing loss on left    Heart murmur    never had any problems   Hemorrhoids    High cholesterol    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   cytology negative   IBS (irritable bowel syndrome)    Insomnia    Lymphocytic colitis    Lymphoma ([facility])    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   Bone marrow biopsy showes 8% plasma cells IgA Lambda   Migraines    Osteoarthritis    Osteopenia    Peripheral neuropathy    PTSD (post-traumatic stress disorder)     Past Surgical History:  Procedure Laterality Date   BONE  MARROW BIOPSY Left 12/18/2013   Plasma cell dyscrasia 8% population of plasma cells   BREAST BIOPSY Right    benign stereo   CESAREAN SECTION  27,07,86   CHOLECYSTECTOMY N/A 10/16/2012   Procedure: LAPAROSCOPIC CHOLECYSTECTOMY;  Surgeon: Joyice Faster. Cornett, MD;  [facility];  Service: General;  Laterality: N/A;   COLONOSCOPY     numerous times   DILATION AND CURETTAGE OF UTERUS     ESOPHAGOGASTRODUODENOSCOPY     HEMORRHOID SURGERY  1993   x3   IUD REMOVAL  02/2015   Mirena   LAPAROSCOPIC LYSIS OF ADHESIONS N/A 10/16/2012   Procedure: LAPAROSCOPIC LYSIS OF ADHESIONS;  Surgeon: Marcello Moores A. Cornett, MD;  [facility];  Service: General;  Laterality: N/A;   LAPAROSCOPY N/A 10/16/2012   Procedure: LAPAROSCOPY DIAGNOSTIC;  Surgeon: Joyice Faster. Cornett, MD;  [facility];  Service: General;  Laterality: N/A;   PELVIC LAPAROSCOPY     RADIOLOGY WITH ANESTHESIA N/A 12/16/2015   Procedure: MRI OF BRAIN WITH AND WITHOUT CONTRAST;  Surgeon: Medication Radiologist, MD;  Location: [facility];  Service: Radiology;  Laterality: N/A;   SHOULDER SURGERY  2007/2008   SPINE SURGERY  2010   cervical  There were no vitals filed for this visit.   Subjective Assessment - 03/01/21 1104     Subjective I had to cancel my aquatics due to illness.    Currently in Pain? Yes    Pain Score 5     Pain Location Back   all joints, Lt LE, thoracolumbar junction   Pain Orientation Right;Mid;Left    Pain Descriptors / Indicators Tightness;Aching    Pain Onset More than a month ago    Pain Frequency Constant    Aggravating Factors  being too still, being sick    Pain Relieving Factors rest, stretching, pain patches                [facility] PT Assessment - 03/01/21 0001       Assessment   Medical Diagnosis M77.41,M77.42 (ICD-10-CM) - Metatarsalgia of both feet; M35.3 (ICD-10-CM) - Polymyalgia rheumatica    Referring Provider (PT) Mayra Neer, MD    Onset Date/Surgical Date 07/29/19    Hand Dominance  Right      Cognition   Overall Cognitive Status Within Functional Limits for tasks assessed      Posture/Postural Control   Posture Comments NDI: 46% disability   didn't retest- pt has not attended PT since last reassessment                          [facility] Adult PT Treatment/Exercise - 03/01/21 0001       Lumbar Exercises: Stretches   Active Hamstring Stretch 3 reps;20 seconds    Single Knee to Chest Stretch 3 reps;20 seconds    Lower Trunk Rotation 3 reps;20 seconds    Piriformis Stretch Left;Right;3 reps;20 seconds    Other Lumbar Stretch Exercise prone quad stretch with and without strap 3x20 seconds      Manual Therapy   Manual Therapy Soft tissue mobilization;Myofascial release    Manual therapy comments elongation to bil low back                     PT Education - 03/01/21 1121     Education Details Access Code: [email or web address]    Person(s) Educated Patient    Methods Explanation;Handout;Demonstration    Comprehension Verbalized understanding;Returned demonstration              PT Short Term Goals - 03/29/20 1030       PT SHORT TERM GOAL #1   Title be independent in initial HEP    Status Achieved               PT Long Term Goals - 02/07/21 1104       PT LONG TERM GOAL #1   Title be independent in advanced HEP with completion 4-5x/wk for 5-10 each    Time 8    Period Weeks    Status On-going    Target Date 04/04/21      PT LONG TERM GOAL #2   Title Pt will be able to walk at least 1 mile without increased pain so she can return to birding.      PT LONG TERM GOAL #3   Title report 75% less neck/UE pain throughout the day with normal functional activities.    Baseline not as painful and can do more stuff- 50-75% better-    Time 8    Period Weeks    Status On-going    Target Date 04/04/21      PT LONG TERM GOAL #  4   Title initiate a gym exercise program and participate > or = to 1x/wk    Baseline unable to complete due  to being sick and concussion.  Pt will begin with aquatic PT-has not been able to start yet.    Time 8    Period Weeks    Status On-going    Target Date 04/04/21      PT LONG TERM GOAL #5   Title Pt will demonstrate at least hip strength of 4+/5 for  improved stability for walking and lifting her grandchildren    Baseline 4/5 hip strength on Lt side    Time 8    Period Weeks    Status On-going    Target Date 04/04/21      PT LONG TERM GOAL #6   Title improve NDI to < or = to 30% disability    Baseline 46%- no change due to not attending PT    Time 8    Status On-going    Target Date 04/04/21                   Plan - 03/01/21 1126     Clinical Impression Statement Pt has had limited treatment and had to cancel aquatics due to fall, concussion and recent illness.   Pt has not been able to exercise regularly due to illness.  Session focused on flexibility and PT issued new exercises today.  Pt has been helping a lot with her 93 young grandchildren, so this has kept her busy and active.   Pt has not been walking.  She is interested and motivated to initiate aquatic PT and a regular gym program.  Pt has multiple medical conditions that will make her progress slow and will be a good candidate for aquatics and regular activity.  Pt will benefit from manual therapy every 3 weeks and aquatics 1x/wk.    PT Frequency 2x / week    PT Duration 8 weeks    PT Treatment/Interventions ADLs/Self Care Home Management;Cryotherapy;Electrical Stimulation;Iontophoresis 72m/ml Dexamethasone;Moist Heat;Ultrasound;Neuromuscular re-education;Therapeutic exercise;Therapeutic activities;Patient/family education;Manual techniques;Dry needling;Passive range of motion;Taping;Aquatic Therapy    PT Next Visit Plan Pool next, work to resume level of progress achieved prior to set-backs, manual    PT Home Exercise Plan Access Code: [email or web address]    Consulted and Agree with Plan of Care Patient              Patient will benefit from skilled therapeutic intervention in order to improve the following deficits and impairments:  Abnormal gait, Increased fascial restricitons, Difficulty walking, Decreased range of motion, Pain, Decreased strength, Postural dysfunction  Visit Diagnosis: Pol[place]algia rheumatica ([place]  Cramp and spasm  Muscle weakness (generalized)     Problem List Patient Active Problem List   Diagnosis Date Noted   GAD (generalized anxiety disorder) 12/26/2017   OCD (obsessive compulsive disorder) 12/26/2017   PTSD (post-traumatic stress disorder) 12/26/2017   DDD (degenerative disc disease), cervical 07/14/2016   Primary osteoarthritis of both feet 07/14/2016   Primary osteoarthritis of both hands 07/14/2016   Other fatigue 07/14/2016   History of IBS 07/14/2016   Osteopenia of multiple sites 07/14/2016   Fibromyalgia 01/13/2016   MGUS (monoclonal gammopathy of unknown significance) 12/23/2013   Chronic cholecystitis without calculus 10/18/2012   Abdominal pain, unspecified site 10/18/2012   Nausea alone 10/18/2012   Unspecified constipation 10/18/2012   Depression 10/18/2012   Anxiety    ASCUS (atypical squamous cells of undetermined significance) on Pap smear  IUD    Hemorrhoids 11/29/2010   Abdominal pain, left upper quadrant 11/29/2010    Sigurd Sos, PT 03/01/21 11:55 AM   [facility] @ [address] [place], [place], [address] Phone: [phone]   Fax:  [phone]  Name: Danielle Bruce MRN: [id number] Date of Birth: 07/16/1960

## 2021-03-04 ENCOUNTER — Ambulatory Visit: Payer: Medicare Other | Admitting: Physical Therapy

## 2021-03-04 ENCOUNTER — Encounter: Payer: Self-pay | Admitting: Physical Therapy

## 2021-03-04 ENCOUNTER — Other Ambulatory Visit: Payer: Self-pay

## 2021-03-04 DIAGNOSIS — R252 Cramp and spasm: Secondary | ICD-10-CM

## 2021-03-04 DIAGNOSIS — M353 Polymyalgia rheumatica: Secondary | ICD-10-CM

## 2021-03-04 DIAGNOSIS — H40013 Open angle with borderline findings, low risk, bilateral: Secondary | ICD-10-CM | POA: Diagnosis not present

## 2021-03-04 DIAGNOSIS — M6281 Muscle weakness (generalized): Secondary | ICD-10-CM

## 2021-03-04 NOTE — Therapy (Signed)
[place] @ [facility] [place], [place], [address] Phone: [phone]   Fax:  [phone]  Physical Therapy Treatment  Patient Details  Name: DAO MEARNS MRN: [id number] Date of Birth: 08-04-1960 Referring Provider (PT): Mayra Neer, MD   Encounter Date: 03/04/2021   PT End of Session - 03/04/21 1517     Visit Number 31    Date for PT Re-Evaluation 04/04/21    Authorization Type Medicare/Medicaid- KX needed at 15 for 2023    Progress Note Due on Visit 40    PT Start Time 1430    PT Stop Time 1510    PT Time Calculation (min) 40 min    Activity Tolerance Patient tolerated treatment well    Behavior During Therapy [facility] for tasks assessed/performed             Past Medical History:  Diagnosis Date   Anemia    Anxiety    Arthritis    osteoarthritis   ASCUS (atypical squamous cells of undetermined significance) on Pap smear 05/06/2005   NEG HIGH RISK HPV--C&B BIOPSY BENIGN 12/2005   Asthma    Bipolar 2 disorder ([place])    Cancer ([place])    skin cancer - basal cell   Colon polyps    Complication of anesthesia    anxious afterwards, will get headaches    Constipation    Depression    Fibromyalgia 10/2013   GERD (gastroesophageal reflux disease)    Hearing loss on left    Heart murmur    never had any problems   Hemorrhoids    High cholesterol    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   cytology negative   IBS (irritable bowel syndrome)    Insomnia    Lymphocytic colitis    Lymphoma ([place])    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   Bone marrow biopsy showes 8% plasma cells IgA Lambda   Migraines    Osteoarthritis    Osteopenia    Peripheral neuropathy    PTSD (post-traumatic stress disorder)     Past Surgical History:  Procedure Laterality Date   BONE MARROW BIOPSY Left 12/18/2013   Plasma cell dyscrasia 8% population of plasma cells   BREAST BIOPSY Right    benign stereo    CESAREAN SECTION  12,87,86   CHOLECYSTECTOMY N/A 10/16/2012   Procedure: LAPAROSCOPIC CHOLECYSTECTOMY;  Surgeon: Joyice Faster. Cornett, MD;  Location: [facility];  Service: General;  Laterality: N/A;   COLONOSCOPY     numerous times   DILATION AND CURETTAGE OF UTERUS     ESOPHAGOGASTRODUODENOSCOPY     HEMORRHOID SURGERY  1993   x3   IUD REMOVAL  02/2015   Mirena   LAPAROSCOPIC LYSIS OF ADHESIONS N/A 10/16/2012   Procedure: LAPAROSCOPIC LYSIS OF ADHESIONS;  Surgeon: Marcello Moores A. Cornett, MD;  Location: [facility];  Service: General;  Laterality: N/A;   LAPAROSCOPY N/A 10/16/2012   Procedure: LAPAROSCOPY DIAGNOSTIC;  Surgeon: Joyice Faster. Cornett, MD;  Location: [facility];  Service: General;  Laterality: N/A;   PELVIC LAPAROSCOPY     RADIOLOGY WITH ANESTHESIA N/A 12/16/2015   Procedure: MRI OF BRAIN WITH AND WITHOUT CONTRAST;  Surgeon: Medication Radiologist, MD;  Location: [place];  Service: Radiology;  Laterality: N/A;   SHOULDER SURGERY  2007/2008   SPINE SURGERY  2010   cervical    There were no vitals filed for this visit.    Treatment: Patient seen for aquatic therapy today.  Treatment took place in water 2.5-4 feet deep depending upon activity.  Pt entered the pool via stairs with mild use of rails. Water temp 92 degrees F. Pt requires buoyancy of water for support and to offload joints with strengthening exercises.    Seated water bench with 75% submersion Pt performed seated LE AROM exercises 20x in all planes, concurrent discussion of current status.   75% submersion: water walking with blue noodle 6x each direction. Side stepping 6x no noodle. Some hip pain increased. High knee marching with yellow noodle 4x short length of pool. Some mild LOB but pt could correct on her own. Horizontal decompression float with 100% flotation and PTA providing lateral sway to mobilize trunk and hip.                            PT Education - 03/04/21 1627     Education Details Water  principles    Person(s) Educated Patient    Methods Explanation    Comprehension Verbalized understanding                 PT Long Term Goals - 02/07/21 1104       PT LONG TERM GOAL #1   Title be independent in advanced HEP with completion 4-5x/wk for 5-10 each    Time 8    Period Weeks    Status On-going    Target Date 04/04/21      PT LONG TERM GOAL #2   Title Pt will be able to walk at least 1 mile without increased pain so she can return to [place].      PT LONG TERM GOAL #3   Title report 75% less neck/UE pain throughout the day with normal functional activities.    Baseline not as painful and can do more stuff- 50-75% better-    Time 8    Period Weeks    Status On-going    Target Date 04/04/21      PT LONG TERM GOAL #4   Title initiate a gym exercise program and participate > or = to 1x/wk    Baseline unable to complete due to being sick and concussion.  Pt will begin with aquatic PT-has not been able to start yet.    Time 8    Period Weeks    Status On-going    Target Date 04/04/21      PT LONG TERM GOAL #5   Title Pt will demonstrate at least hip strength of 4+/5 for  improved stability for walking and lifting her grandchildren    Baseline 4/5 hip strength on Lt side    Time 8    Period Weeks    Status On-going    Target Date 04/04/21      PT LONG TERM GOAL #6   Title improve NDI to < or = to 30% disability    Baseline 46%- no change due to not attending PT    Time 8    Status On-going    Target Date 04/04/21                   Plan - 03/04/21 1627     Clinical Impression Statement Pt arrives to her first aquatic session with mild hip pain and slight headache. Despite her complaints pt did exceptionally well with all her water exercises. Some decompression floating helped reduce any slight increases in her hip pain from water walking. Pt reported  she enjoyed exercising in the water.    PT Duration 8 weeks    PT Treatment/Interventions  ADLs/Self Care Home Management;Cryotherapy;Electrical Stimulation;Iontophoresis 34m/ml Dexamethasone;Moist Heat;Ultrasound;Neuromuscular re-education;Therapeutic exercise;Therapeutic activities;Patient/family education;Manual techniques;Dry needling;Passive range of motion;Taping;Aquatic Therapy    PT Next Visit Plan Pool next, work to resume level of progress achieved prior to set-backs, manual    PT Home Exercise Plan Access Code: [email or web address]    Consulted and Agree with Plan of Care Patient             Patient will benefit from skilled therapeutic intervention in order to improve the following deficits and impairments:  Abnormal gait, Increased fascial restricitons, Difficulty walking, Decreased range of motion, Pain, Decreased strength, Postural dysfunction  Visit Diagnosis: Polymyalgia rheumatica ([place]  Cramp and spasm  Muscle weakness (generalized)     Problem List Patient Active Problem List   Diagnosis Date Noted   GAD (generalized anxiety disorder) 12/26/2017   OCD (obsessive compulsive disorder) 12/26/2017   PTSD (post-traumatic stress disorder) 12/26/2017   DDD (degenerative disc disease), cervical 07/14/2016   Primary osteoarthritis of both feet 07/14/2016   Primary osteoarthritis of both hands 07/14/2016   Other fatigue 07/14/2016   History of IBS 07/14/2016   Osteopenia of multiple sites 07/14/2016   Fibromyalgia 01/13/2016   MGUS (monoclonal gammopathy of unknown significance) 12/23/2013   Chronic cholecystitis without calculus 10/18/2012   Abdominal pain, unspecified site 10/18/2012   Nausea alone 10/18/2012   Unspecified constipation 10/18/2012   Depression 10/18/2012   Anxiety    ASCUS (atypical squamous cells of undetermined significance) on Pap smear    IUD    Hemorrhoids 11/29/2010   Abdominal pain, left upper quadrant 11/29/2010    Raylyn Carton, PTA 03/04/2021, 4:31 PM  [place] [place] [address]Phone: [phone]  Fax:  [phone] Name: CKATURAH KARAPETIANMRN: [id number] of Birth: [birth year]

## 2021-03-10 ENCOUNTER — Other Ambulatory Visit: Payer: Self-pay

## 2021-03-10 ENCOUNTER — Ambulatory Visit (INDEPENDENT_AMBULATORY_CARE_PROVIDER_SITE_OTHER): Payer: Medicare Other | Admitting: Addiction (Substance Use Disorder)

## 2021-03-10 DIAGNOSIS — F331 Major depressive disorder, recurrent, moderate: Secondary | ICD-10-CM

## 2021-03-10 NOTE — Progress Notes (Signed)
[facility] Counselor/Therapist Progress Note  Patient ID: Danielle Bruce, MRN: [id number],    Date: 03/10/2021  Time Spent: 69mins  Treatment Type: Individual Therapy  Reported Symptoms: adjusting to aging parents  Mental Status Exam:   Appearance:   Casual and Well Groomed     Behavior:  Sharing  Motor:  Normal  Speech/Language:   Clear and Coherent and Normal Rate  Affect:  Appropriate and Full Range  Mood:  sad  Thought process:  normal  Thought content:    Obsessions and Rumination  Sensory/Perceptual disturbances:    Flashback  Orientation:  Oriented x4  Attention:  Good  Concentration:  Fair  Memory:  [place] of knowledge:   Good  Insight:    Good  Judgment:   Fair  Impulse Control:  Good   Risk Assessment: Danger to Self:  No Self-injurious Behavior: No Danger to Others: No Duty to Warn:no Physical Aggression / Violence:No  Access to Firearms a concern: No  Gang Involvement:No   Subjective: Client reported where she has been the last 4 months: sick at home and with her grandkids too. Client processed how she is grieving her parents' aging and trying to adjust to her mom's denial of her dad's downward physical spiral. Therapist processed with client using MI & Grief therapy to validate client's pain and adjustments with her dad's physical spiral. Therapist also used CBT with client to help her process more of her emotions around this and her thoughts about losing her parents and loneliness that she will feel. Therapist assessed for safety and stability and client denied SI/HI/AVH.   Interventions: Cognitive Behavioral Therapy, Motivational Interviewing, and Grief Therapy  Diagnosis:   ICD-10-CM   1. Major depressive disorder, recurrent episode, moderate ([place])  F33.1       Plan of Care for emotion regulation:  Client to return for weekly therapy with therapist [name], for outpatient therapy and see medication provider for support of mood  management.  Client to engage in CBT: challenging negative internal ruminations and self-talk AEB expressing toxic thoughts and challenging them with truth.  Client to practice DBT distress tolerance skills (such as distress tolerance and emotion regulation skills and achieving wise mind) to build support for dealing with emotional outbursts or internal emotional collapse AEB: using TIP, learning to ride the wave of emotion/sensations, staying in the present, and increasing their belief that they can do hard things.  Client to utilize BSP (brainspotting) with therapist to help client identify and process triggers for their emotional dysregulation, with goal of reducing said SUDs by 33% each session.  Client to ground their body if overwhelmed, flooded, or disassociating and not feeling safe using focused mindfulness, grounding, or meditation.  Client also to create more stability & structure AEB following goals to assist in helping stabilize their life domains, helping to calm their nervous system and to build healthy brain neuropathways. Client to prioritize self-care techniques and implement coping strategies to reduce the use of behaviors that incur consequences to themselves or others around them. Client is to practice self-compassion AEB being gentle with themselves, utilizing self-care techniques daily or as needed when grieving something in the moment.  Client is to process grief/pain of her loss of her ex-boyfriend in a somatic body-felt sense way: ie using mindfulness, brainspotting, or trauma release as a method for releasing body pain/tension caused by grief.  [name], LCSW, LCAS, CCTP, CCS, BSP

## 2021-03-11 ENCOUNTER — Ambulatory Visit: Payer: Medicare Other | Admitting: Physical Therapy

## 2021-03-18 ENCOUNTER — Encounter: Payer: Self-pay | Admitting: Physical Therapy

## 2021-03-18 ENCOUNTER — Other Ambulatory Visit: Payer: Self-pay

## 2021-03-18 ENCOUNTER — Ambulatory Visit: Payer: Medicare Other | Admitting: Physical Therapy

## 2021-03-18 DIAGNOSIS — M353 Polymyalgia rheumatica: Secondary | ICD-10-CM

## 2021-03-18 DIAGNOSIS — M6281 Muscle weakness (generalized): Secondary | ICD-10-CM | POA: Diagnosis not present

## 2021-03-18 DIAGNOSIS — R252 Cramp and spasm: Secondary | ICD-10-CM

## 2021-03-18 NOTE — Therapy (Signed)
[place] @ [facility] [place], [place], [address] Phone: [phone]   Fax:  [phone]  Physical Therapy Treatment  Patient Details  Name: Danielle Bruce MRN: [id number] Date of Birth: [birth year] Referring Provider (PT): Mayra Neer, MD   Encounter Date: 03/18/2021   PT End of Session - 03/18/21 1209     Visit Number 32    Date for PT Re-Evaluation 04/04/21    Authorization Type Medicare/Medicaid- KX needed at 15 for 2023    Progress Note Due on Visit 40    PT Start Time 1210    PT Stop Time 1251    PT Time Calculation (min) 41 min    Activity Tolerance Patient tolerated treatment well    Behavior During Therapy [facility] for tasks assessed/performed             Past Medical History:  Diagnosis Date   Anemia    Anxiety    Arthritis    osteoarthritis   ASCUS (atypical squamous cells of undetermined significance) on Pap smear 05/06/2005   NEG HIGH RISK HPV--C&B BIOPSY BENIGN 12/2005   Asthma    Bipolar 2 disorder ([place])    Cancer ([place])    skin cancer - basal cell   Colon polyps    Complication of anesthesia    anxious afterwards, will get headaches    Constipation    Depression    Fibromyalgia 10/2013   GERD (gastroesophageal reflux disease)    Hearing loss on left    Heart murmur    never had any problems   Hemorrhoids    High cholesterol    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   cytology negative   IBS (irritable bowel syndrome)    Insomnia    Lymphocytic colitis    Lymphoma ([place])    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   Bone marrow biopsy showes 8% plasma cells IgA Lambda   Migraines    Osteoarthritis    Osteopenia    Peripheral neuropathy    PTSD (post-traumatic stress disorder)     Past Surgical History:  Procedure Laterality Date   BONE MARROW BIOPSY Left 12/18/2013   Plasma cell dyscrasia 8% population of plasma cells   BREAST BIOPSY Right    benign stereo    CESAREAN SECTION  09,32,35   CHOLECYSTECTOMY N/A 10/16/2012   Procedure: LAPAROSCOPIC CHOLECYSTECTOMY;  Surgeon: Joyice Faster. Cornett, MD;  [facility];  Service: General;  Laterality: N/A;   COLONOSCOPY     numerous times   DILATION AND CURETTAGE OF UTERUS     ESOPHAGOGASTRODUODENOSCOPY     HEMORRHOID SURGERY  1993   x3   IUD REMOVAL  02/2015   Mirena   LAPAROSCOPIC LYSIS OF ADHESIONS N/A 10/16/2012   Procedure: LAPAROSCOPIC LYSIS OF ADHESIONS;  Surgeon: Marcello Moores A. Cornett, MD;  [facility];  Service: General;  Laterality: N/A;   LAPAROSCOPY N/A 10/16/2012   Procedure: LAPAROSCOPY DIAGNOSTIC;  Surgeon: Joyice Faster. Cornett, MD;  [facility];  Service: General;  Laterality: N/A;   PELVIC LAPAROSCOPY     RADIOLOGY WITH ANESTHESIA N/A 12/16/2015   Procedure: MRI OF BRAIN WITH AND WITHOUT CONTRAST;  Surgeon: Medication Radiologist, MD;  Location: [place];  Service: Radiology;  Laterality: N/A;   SHOULDER SURGERY  2007/2008   SPINE SURGERY  2010   cervical    There were no vitals filed for this visit.    Treatment: Patient seen for aquatic therapy today.  Treatment took place in water 3.5-4.5 feet deep depending upon activity.  Pt entered the pool via stairs, mild use of rails. Water temp 90 degrees F. Pt requires buoyancy of water for support and to offload joints with strengthening exercises.    Seated water bench with 75% submersion Pt performed seated LE AROM exercises 20x in all planes, concurrent discussion of current status.  Standing; 75% depth, water walking 10x in each direction, high knee marching with blue noodle for UE/postural support 6x, core compressions standing against the wall with blue noodle10x;Horizontal decompression float with 100% flotation and PTA providing lateral sway to mobilize trunk.                              PT Short Term Goals - 03/29/20 1030       PT SHORT TERM GOAL #1   Title be independent in initial HEP    Status  Achieved               PT Long Term Goals - 02/07/21 1104       PT LONG TERM GOAL #1   Title be independent in advanced HEP with completion 4-5x/wk for 5-10 each    Time 8    Period Weeks    Status On-going    Target Date 04/04/21      PT LONG TERM GOAL #2   Title Pt will be able to walk at least 1 mile without increased pain so she can return to [place].      PT LONG TERM GOAL #3   Title report 75% less neck/UE pain throughout the day with normal functional activities.    Baseline not as painful and can do more stuff- 50-75% better-    Time 8    Period Weeks    Status On-going    Target Date 04/04/21      PT LONG TERM GOAL #4   Title initiate a gym exercise program and participate > or = to 1x/wk    Baseline unable to complete due to being sick and concussion.  Pt will begin with aquatic PT-has not been able to start yet.    Time 8    Period Weeks    Status On-going    Target Date 04/04/21      PT LONG TERM GOAL #5   Title Pt will demonstrate at least hip strength of 4+/5 for  improved stability for walking and lifting her grandchildren    Baseline 4/5 hip strength on Lt side    Time 8    Period Weeks    Status On-going    Target Date 04/04/21      PT LONG TERM GOAL #6   Title improve NDI to < or = to 30% disability    Baseline 46%- no change due to not attending PT    Time 8    Status On-going    Target Date 04/04/21                   Plan - 03/18/21 1211     Clinical Impression Statement Pt arrives for aquatic PT today. Pt reports missing last session due to intense migraine associated with her depression. Pt did well with water walking today but noticed an increase in body tension while performing her core compressions. We discussed ways to help herself in those situations when she feels a whole body tension coming on in hopes of  reducing it in in a more timely fashion. Pt was able to use the horizontal decompression float to downregulate her  system. Water temp was not very high today and the cooler water did not feel great to pt.    Personal Factors and Comorbidities Comorbidity 2    Comorbidities OA bilat feet; fibromyalgia    Stability/Clinical Decision Making Evolving/Moderate complexity    Rehab Potential Excellent    PT Frequency 2x / week    PT Duration 8 weeks    PT Treatment/Interventions ADLs/Self Care Home Management;Cryotherapy;Electrical Stimulation;Iontophoresis 48m/ml Dexamethasone;Moist Heat;Ultrasound;Neuromuscular re-education;Therapeutic exercise;Therapeutic activities;Patient/family education;Manual techniques;Dry needling;Passive range of motion;Taping;Aquatic Therapy    PT Next Visit Plan See how pool went, work to resume level of progress achieved prior to set-backs, manual    PT Home Exercise Plan Access Code: [email or web address]             Patient will benefit from skilled therapeutic intervention in order to improve the following deficits and impairments:  Abnormal gait, Increased fascial restricitons, Difficulty walking, Decreased range of motion, Pain, Decreased strength, Postural dysfunction  Visit Diagnosis: Polymyalgia rheumatica ([place]  Cramp and spasm  Muscle weakness (generalized)     Problem List Patient Active Problem List   Diagnosis Date Noted   GAD (generalized anxiety disorder) 12/26/2017   OCD (obsessive compulsive disorder) 12/26/2017   PTSD (post-traumatic stress disorder) 12/26/2017   DDD (degenerative disc disease), cervical 07/14/2016   Primary osteoarthritis of both feet 07/14/2016   Primary osteoarthritis of both hands 07/14/2016   Other fatigue 07/14/2016   History of IBS 07/14/2016   Osteopenia of multiple sites 07/14/2016   Fibromyalgia 01/13/2016   MGUS (monoclonal gammopathy of unknown significance) 12/23/2013   Chronic cholecystitis without calculus 10/18/2012   Abdominal pain, unspecified site 10/18/2012   Nausea alone 10/18/2012   Unspecified constipation  10/18/2012   Depression 10/18/2012   Anxiety    ASCUS (atypical squamous cells of undetermined significance) on Pap smear    IUD    Hemorrhoids 11/29/2010   Abdominal pain, left upper quadrant 11/29/2010    Danielle Bruce, PTA 03/18/2021, 12:58 PM  [place] [place] [address]Phone: [phone]  Fax:  [phone] Name: Danielle TULLMRN: [id number] of Birth: [birth year]

## 2021-03-21 ENCOUNTER — Other Ambulatory Visit: Payer: Self-pay

## 2021-03-21 ENCOUNTER — Ambulatory Visit: Payer: Medicare Other

## 2021-03-21 DIAGNOSIS — M6281 Muscle weakness (generalized): Secondary | ICD-10-CM | POA: Diagnosis not present

## 2021-03-21 DIAGNOSIS — F4310 Post-traumatic stress disorder, unspecified: Secondary | ICD-10-CM | POA: Diagnosis not present

## 2021-03-21 DIAGNOSIS — M797 Fibromyalgia: Secondary | ICD-10-CM | POA: Diagnosis not present

## 2021-03-21 DIAGNOSIS — R252 Cramp and spasm: Secondary | ICD-10-CM

## 2021-03-21 DIAGNOSIS — M353 Polymyalgia rheumatica: Secondary | ICD-10-CM

## 2021-03-21 DIAGNOSIS — F3181 Bipolar II disorder: Secondary | ICD-10-CM | POA: Diagnosis not present

## 2021-03-21 DIAGNOSIS — E7800 Pure hypercholesterolemia, unspecified: Secondary | ICD-10-CM | POA: Diagnosis not present

## 2021-03-21 DIAGNOSIS — G43909 Migraine, unspecified, not intractable, without status migrainosus: Secondary | ICD-10-CM | POA: Diagnosis not present

## 2021-03-21 DIAGNOSIS — M810 Age-related osteoporosis without current pathological fracture: Secondary | ICD-10-CM | POA: Diagnosis not present

## 2021-03-21 DIAGNOSIS — R632 Polyphagia: Secondary | ICD-10-CM | POA: Diagnosis not present

## 2021-03-21 NOTE — Therapy (Signed)
[facility] @ [address] [place], [place], [address] Phone: [phone]   Fax:  [phone]  Physical Therapy Treatment  Patient Details  Name: Danielle Bruce MRN: [id number] Date of Birth: 07-13-1960 Referring Provider (PT): Mayra Neer, MD   Encounter Date: 03/21/2021   PT End of Session - 03/21/21 1143     Visit Number 33    Date for PT Re-Evaluation 04/04/21    Authorization Type Medicare/Medicaid- KX needed at 15 for 2023    Progress Note Due on Visit 40    PT Start Time 1101    PT Stop Time 1140    PT Time Calculation (min) 39 min    Activity Tolerance Patient tolerated treatment well    Behavior During Therapy [facility] for tasks assessed/performed             Past Medical History:  Diagnosis Date   Anemia    Anxiety    Arthritis    osteoarthritis   ASCUS (atypical squamous cells of undetermined significance) on Pap smear 05/06/2005   NEG HIGH RISK HPV--C&B BIOPSY BENIGN 12/2005   Asthma    Bipolar 2 disorder ([place])    Cancer ([place])    skin cancer - basal cell   Colon polyps    Complication of anesthesia    anxious afterwards, will get headaches    Constipation    Depression    Fibromyalgia 10/2013   GERD (gastroesophageal reflux disease)    Hearing loss on left    Heart murmur    never had any problems   Hemorrhoids    High cholesterol    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   cytology negative   IBS (irritable bowel syndrome)    Insomnia    Lymphocytic colitis    Lymphoma ([place])    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   Bone marrow biopsy showes 8% plasma cells IgA Lambda   Migraines    Osteoarthritis    Osteopenia    Peripheral neuropathy    PTSD (post-traumatic stress disorder)     Past Surgical History:  Procedure Laterality Date   BONE MARROW BIOPSY Left 12/18/2013   Plasma cell dyscrasia 8% population of plasma cells   BREAST BIOPSY Right    benign stereo    CESAREAN SECTION  75,88,32   CHOLECYSTECTOMY N/A 10/16/2012   Procedure: LAPAROSCOPIC CHOLECYSTECTOMY;  Surgeon: Joyice Faster. Cornett, MD;  Location: [facility];  Service: General;  Laterality: N/A;   COLONOSCOPY     numerous times   DILATION AND CURETTAGE OF UTERUS     ESOPHAGOGASTRODUODENOSCOPY     HEMORRHOID SURGERY  1993   x3   IUD REMOVAL  02/2015   Mirena   LAPAROSCOPIC LYSIS OF ADHESIONS N/A 10/16/2012   Procedure: LAPAROSCOPIC LYSIS OF ADHESIONS;  Surgeon: Marcello Moores A. Cornett, MD;  Location: [facility];  Service: General;  Laterality: N/A;   LAPAROSCOPY N/A 10/16/2012   Procedure: LAPAROSCOPY DIAGNOSTIC;  Surgeon: Joyice Faster. Cornett, MD;  Location: [facility];  Service: General;  Laterality: N/A;   PELVIC LAPAROSCOPY     RADIOLOGY WITH ANESTHESIA N/A 12/16/2015   Procedure: MRI OF BRAIN WITH AND WITHOUT CONTRAST;  Surgeon: Medication Radiologist, MD;  Location: [place];  Service: Radiology;  Laterality: N/A;   SHOULDER SURGERY  2007/2008   SPINE SURGERY  2010   cervical    There were no vitals filed for this visit.   Subjective Assessment - 03/21/21 1103  Subjective The aquatic therapy has been amazing. I can move in the water and it relaxes me.  I used my TENs unit the other day and it really didn't help me.    Patient Stated Goals Pt wants pain management and be able to move more without taking medication.    Currently in Pain? Yes    Pain Score 4     Pain Location Neck    Pain Orientation Left;Right;Mid    Pain Descriptors / Indicators Aching;Tightness    Pain Type Chronic pain    Pain Onset More than a month ago    Pain Frequency Constant    Aggravating Factors  being  too still    Pain Relieving Factors when I am distracted, [place]                [facility] PT Assessment - 03/21/21 0001       Assessment   Medical Diagnosis M77.41,M77.42 (ICD-10-CM) - Metatarsalgia of both feet; M35.3 (ICD-10-CM) - Polymyalgia rheumatica    Referring Provider (PT) Mayra Neer, MD    Onset  Date/Surgical Date 07/29/19    Hand Dominance Right      Cognition   Overall Cognitive Status Within Functional Limits for tasks assessed      Posture/Postural Control   Posture Comments NDI: 46% disability   didn't retest- pt has not attended PT since last reassessment                          [facility] Adult PT Treatment/Exercise - 03/21/21 0001       Manual Therapy   Manual Therapy Soft tissue mobilization;Myofascial release    Manual therapy comments elongation to bil neck and upper traps    Kinesiotex Facilitate Muscle   X tape over rhomboids for postural feedback             Trigger Point Dry Needling - 03/21/21 0001     Consent Given? Yes    Muscles Treated Head and Neck Upper trapezius;Cervical multifidi;Suboccipitals    Upper Trapezius Response Twitch reponse elicited;Palpable increased muscle length    Oblique Capitus Response Twitch response elicited;Palpable increased muscle length    Suboccipitals Response Twitch response elicited;Palpable increased muscle length                     PT Short Term Goals - 03/29/20 1030       PT SHORT TERM GOAL #1   Title be independent in initial HEP    Status Achieved               PT Long Term Goals - 03/21/21 1107       PT LONG TERM GOAL #1   Title be independent in advanced HEP with completion 4-5x/wk for 5-10 each    Baseline going to the pool now, going to join gym    Time 8    Status On-going    Target Date 05/16/21      PT LONG TERM GOAL #2   Title Pt will be able to walk at least 1 mile without increased pain so she can return to birding.    Baseline I can walk 1 mile    Status Achieved      PT LONG TERM GOAL #3   Title report 75% less neck/UE pain throughout the day with normal functional activities.    Baseline not as painful and can do more stuff- 50-75% better-pt has been  sick so limited progress    Time 8    Period Weeks    Status On-going    Target Date 05/16/21       PT LONG TERM GOAL #4   Title initiate a gym exercise program and participate > or = to 1x/wk    Baseline started aquatic PT and is goint to transition to the gym    Time 8    Period Weeks    Status On-going    Target Date 05/16/21      PT LONG TERM GOAL #5   Title Pt will demonstrate at least hip strength of 4+/5 for  improved stability for walking and lifting her grandchildren    Baseline 4/5 hip strength on Lt side    Time 8    Period Weeks    Status Not Met    Target Date 05/16/21      PT LONG TERM GOAL #6   Title improve NDI to < or = to 30% disability    Baseline 46%- no change due to not attending PT    Time 8    Period Weeks    Status On-going    Target Date 05/16/21                   Plan - 03/21/21 1143     Clinical Impression Statement Pt has been able to attend aquatic PT and is doing really well with this.  Pt has had illness and stress which has slowed her progress.  She has now been able to complete her HEP more frequently but remains busy with caring for her young grandchildren.  Pt is working to address her posture.  Pt with tension and trigger points in neck and upper traps, Lt>Rt.  Pt had good response to DN and manual therapy today with improved tissue mobility.  Pt will continue to benefit from aquatics 1x/wk and land based PT to address pain and tissue mobility once every 3 weeks to manage chronic pain condition,    PT Frequency 2x / week    PT Duration 8 weeks    PT Treatment/Interventions ADLs/Self Care Home Management;Cryotherapy;Electrical Stimulation;Iontophoresis 63m/ml Dexamethasone;Moist Heat;Ultrasound;Neuromuscular re-education;Therapeutic exercise;Therapeutic activities;Patient/family education;Manual techniques;Dry needling;Passive range of motion;Taping;Aquatic Therapy    PT Next Visit Plan aquatics 1x/wk and land PT every 3 weeks.    PT Home Exercise Plan Access Code: [email or web address]    Recommended Other Services recert sent 11/61/09    Consulted and Agree with Plan of Care Patient             Patient will benefit from skilled therapeutic intervention in order to improve the following deficits and impairments:  Abnormal gait, Increased fascial restricitons, Difficulty walking, Decreased range of motion, Pain, Decreased strength, Postural dysfunction  Visit Diagnosis: Polymyalgia rheumatica ([place]  Cramp and spasm  Muscle weakness (generalized)     Problem List Patient Active Problem List   Diagnosis Date Noted   GAD (generalized anxiety disorder) 12/26/2017   OCD (obsessive compulsive disorder) 12/26/2017   PTSD (post-traumatic stress disorder) 12/26/2017   DDD (degenerative disc disease), cervical 07/14/2016   Primary osteoarthritis of both feet 07/14/2016   Primary osteoarthritis of both hands 07/14/2016   Other fatigue 07/14/2016   History of IBS 07/14/2016   Osteopenia of multiple sites 07/14/2016   Fibromyalgia 01/13/2016   MGUS (monoclonal gammopathy of unknown significance) 12/23/2013   Chronic cholecystitis without calculus 10/18/2012   Abdominal pain, unspecified site 10/18/2012   Nausea alone 10/18/2012  Unspecified constipation 10/18/2012   Depression 10/18/2012   Anxiety    ASCUS (atypical squamous cells of undetermined significance) on Pap smear    IUD    Hemorrhoids 11/29/2010   Abdominal pain, left upper quadrant 11/29/2010   Sigurd Sos, PT 03/21/21 11:45 AM  [facility] @ [place], [place], [address] Phone: [phone]   Fax:  [phone]  Name: KATRIANA DORTCH MRN: [id number] Date of Birth: 03/28/1960

## 2021-03-21 NOTE — Addendum Note (Signed)
Addended by: Danie Binder on: 03/21/2021 11:49 AM   Modules accepted: Orders

## 2021-03-22 ENCOUNTER — Other Ambulatory Visit: Payer: Self-pay | Admitting: Family Medicine

## 2021-03-22 DIAGNOSIS — Z1231 Encounter for screening mammogram for malignant neoplasm of breast: Secondary | ICD-10-CM

## 2021-03-24 ENCOUNTER — Other Ambulatory Visit: Payer: Self-pay

## 2021-03-24 ENCOUNTER — Ambulatory Visit (INDEPENDENT_AMBULATORY_CARE_PROVIDER_SITE_OTHER): Payer: Medicare Other | Admitting: Addiction (Substance Use Disorder)

## 2021-03-24 DIAGNOSIS — F411 Generalized anxiety disorder: Secondary | ICD-10-CM

## 2021-03-24 DIAGNOSIS — F428 Other obsessive-compulsive disorder: Secondary | ICD-10-CM | POA: Diagnosis not present

## 2021-03-24 NOTE — Progress Notes (Signed)
[facility] Counselor/Therapist Progress Note  Patient ID: Danielle Bruce, MRN: [id number],    Date: 03/24/2021  Time Spent: 79mins  Treatment Type: Individual Therapy  Reported Symptoms: spiraling intrusive thoughts, more depression and anxiety.   Mental Status Exam:   Appearance:   Casual and Well Groomed     Behavior:  Sharing  Motor:  Normal  Speech/Language:   Clear and Coherent and Normal Rate  Affect:  Appropriate and Full Range  Mood:  anxious, depressed, and sad  Thought process:  normal  Thought content:    Obsessions and Rumination  Sensory/Perceptual disturbances:    Flashback  Orientation:  Oriented x4  Attention:  Good  Concentration:  Fair  Memory:  [place] of knowledge:   Good  Insight:    Good  Judgment:   Fair  Impulse Control:  Good   Risk Assessment: Danger to Self:  No Self-injurious Behavior: No Danger to Others: No Duty to Warn:no Physical Aggression / Violence:No  Access to Firearms a concern: No  Gang Involvement:No   Subjective: Client reported having "a spiraling obsessive thoughts issue" lately. Client shared about hearing something on the news about another killing her babies that caused her to obsess about horrible possible things that can happen in the world or the million things that aren't probable but could happen to her grandkids. Client processed her curiosity of wanting to step up to fight someone else to defend ones she loves but freezing and not being able to stand up for herself. Therapist used MI & psychoed to help client feel validated and normalized her body's innate response to a threat. Client and therapist used mindfulness to help calm her nerves after processing those triggers. Client further discussed her worries about her kids and their burdens; therapist used CBT with client to support her in processing her fears related to her kids griefs. Therapist assessed for safety and stability and client denied SI/HI/AVH.    Interventions: Cognitive Behavioral Therapy, Mindfulness Meditation, Motivational Interviewing, and Psycho-education/Bibliotherapy  Diagnosis:   ICD-10-CM   1. Obsessional thoughts  F42.8     2. Generalized anxiety disorder  F41.1        Plan of Care for emotion regulation:  Client to return for weekly therapy with therapist [name], for outpatient therapy and see medication provider for support of mood management.  Client to engage in CBT: challenging negative internal ruminations and self-talk AEB expressing toxic thoughts and challenging them with truth.  Client to practice DBT distress tolerance skills (such as distress tolerance and emotion regulation skills and achieving wise mind) to build support for dealing with emotional outbursts or internal emotional collapse AEB: using TIP, learning to ride the wave of emotion/sensations, staying in the present, and increasing their belief that they can do hard things.  Client to utilize BSP (brainspotting) with therapist to help client identify and process triggers for their emotional dysregulation, with goal of reducing said SUDs by 33% each session.  Client to ground their body if overwhelmed, flooded, or disassociating and not feeling safe using focused mindfulness, grounding, or meditation.  Client also to create more stability & structure AEB following goals to assist in helping stabilize their life domains, helping to calm their nervous system and to build healthy brain neuropathways. Client to prioritize self-care techniques and implement coping strategies to reduce the use of behaviors that incur consequences to themselves or others around them. Client is to practice self-compassion AEB being gentle with themselves,  utilizing self-care techniques daily or as needed when grieving something in the moment.  Client is to process grief/pain of her loss of her ex-boyfriend in a somatic body-felt sense way: ie using mindfulness,  brainspotting, or trauma release as a method for releasing body pain/tension caused by grief.  [name], LCSW, LCAS, CCTP, CCS, BSP

## 2021-03-25 ENCOUNTER — Ambulatory Visit: Payer: Medicare Other | Admitting: Physical Therapy

## 2021-03-25 ENCOUNTER — Encounter: Payer: Self-pay | Admitting: Physical Therapy

## 2021-03-25 DIAGNOSIS — M353 Polymyalgia rheumatica: Secondary | ICD-10-CM | POA: Diagnosis not present

## 2021-03-25 DIAGNOSIS — R252 Cramp and spasm: Secondary | ICD-10-CM | POA: Diagnosis not present

## 2021-03-25 DIAGNOSIS — M6281 Muscle weakness (generalized): Secondary | ICD-10-CM

## 2021-03-25 NOTE — Therapy (Signed)
[facility] @ [place] [place], [place], [address] Phone: [phone]   Fax:  [phone]  Physical Therapy Treatment  Patient Details  Name: Danielle Bruce MRN: [id number] Date of Birth: Dec 10, 1960 Referring Provider (PT): Mayra Neer, MD   Encounter Date: 03/25/2021   PT End of Session - 03/25/21 1413     Visit Number 34    Date for PT Re-Evaluation 04/04/21    Authorization Type Medicare/Medicaid- KX needed at 15 for 2023    Progress Note Due on Visit 40    PT Start Time 1412    PT Stop Time 1501    PT Time Calculation (min) 49 min    Activity Tolerance Patient tolerated treatment well    Behavior During Therapy [facility] for tasks assessed/performed             Past Medical History:  Diagnosis Date   Anemia    Anxiety    Arthritis    osteoarthritis   ASCUS (atypical squamous cells of undetermined significance) on Pap smear 05/06/2005   NEG HIGH RISK HPV--C&B BIOPSY BENIGN 12/2005   Asthma    Bipolar 2 disorder ([place])    Cancer ([place])    skin cancer - basal cell   Colon polyps    Complication of anesthesia    anxious afterwards, will get headaches    Constipation    Depression    Fibromyalgia 10/2013   GERD (gastroesophageal reflux disease)    Hearing loss on left    Heart murmur    never had any problems   Hemorrhoids    High cholesterol    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   cytology negative   IBS (irritable bowel syndrome)    Insomnia    Lymphocytic colitis    Lymphoma ([place])    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   Bone marrow biopsy showes 8% plasma cells IgA Lambda   Migraines    Osteoarthritis    Osteopenia    Peripheral neuropathy    PTSD (post-traumatic stress disorder)     Past Surgical History:  Procedure Laterality Date   BONE MARROW BIOPSY Left 12/18/2013   Plasma cell dyscrasia 8% population of plasma cells   BREAST BIOPSY Right    benign stereo    CESAREAN SECTION  90,24,09   CHOLECYSTECTOMY N/A 10/16/2012   Procedure: LAPAROSCOPIC CHOLECYSTECTOMY;  Surgeon: Joyice Faster. Cornett, MD;  [facility];  Service: General;  Laterality: N/A;   COLONOSCOPY     numerous times   DILATION AND CURETTAGE OF UTERUS     ESOPHAGOGASTRODUODENOSCOPY     HEMORRHOID SURGERY  1993   x3   IUD REMOVAL  02/2015   Mirena   LAPAROSCOPIC LYSIS OF ADHESIONS N/A 10/16/2012   Procedure: LAPAROSCOPIC LYSIS OF ADHESIONS;  Surgeon: Marcello Moores A. Cornett, MD;  [facility];  Service: General;  Laterality: N/A;   LAPAROSCOPY N/A 10/16/2012   Procedure: LAPAROSCOPY DIAGNOSTIC;  Surgeon: Joyice Faster. Cornett, MD;  Location[facility];  Service: General;  Laterality: N/A;   PELVIC LAPAROSCOPY     RADIOLOGY WITH ANESTHESIA N/A 12/16/2015   Procedure: MRI OF BRAIN WITH AND WITHOUT CONTRAST;  Surgeon: Medication Radiologist, MD;  Location: [facility];  Service: Radiology;  Laterality: N/A;   SHOULDER SURGERY  2007/2008   SPINE SURGERY  2010   cervical    There were no vitals filed for this visit.   Subjective Assessment - 03/25/21 1414  Subjective My neck and upper back is achey/sore today/this week.    Pertinent History goes by "Danielle Bruce" ; OA bilateral feet; fibromyalgia; off cycle of steriod medicine currenlty    Currently in Pain? Yes    Pain Score 4     Pain Location Neck    Pain Orientation Mid    Pain Descriptors / Indicators Tightness;Sore             Treatment: Patient seen for aquatic therapy today.  Treatment took place in water 2.5-4 feet deep depending upon activity.  Pt entered the pool via stairs with mild use of rails. Water temp 97 degrees F. Pt requires buoyancy of water for support and to offload joints with strengthening exercises.   50%-75% depth for standing: Water walking 10x in all directions with big UE movements. High knee marching with yellow noodle 6x. Abdominal compressions with kick board 5 sec hold 10x, Wall exs inc; hip 3 ways 15x Bil, VC  to make ROM reasonable as to not aggrevate LT hip. Underwwater bicycle using yellow noodle 2 min: 2 bouts with 1 min rest in between. Soft tissue mobilization to back muscles x 3 min post utilizing hot tub jets.                             PT Short Term Goals - 03/29/20 1030       PT SHORT TERM GOAL #1   Title be independent in initial HEP    Status Achieved               PT Long Term Goals - 03/21/21 1107       PT LONG TERM GOAL #1   Title be independent in advanced HEP with completion 4-5x/wk for 5-10 each    Baseline going to the pool now, going to join gym    Time 8    Status On-going    Target Date 05/16/21      PT LONG TERM GOAL #2   Title Pt will be able to walk at least 1 mile without increased pain so she can return to [place].    Baseline I can walk 1 mile    Status Achieved      PT LONG TERM GOAL #3   Title report 75% less neck/UE pain throughout the day with normal functional activities.    Baseline not as painful and can do more stuff- 50-75% better-pt has been sick so limited progress    Time 8    Period Weeks    Status On-going    Target Date 05/16/21      PT LONG TERM GOAL #4   Title initiate a gym exercise program and participate > or = to 1x/wk    Baseline started aquatic PT and is goint to transition to the gym    Time 8    Period Weeks    Status On-going    Target Date 05/16/21      PT LONG TERM GOAL #5   Title Pt will demonstrate at least hip strength of 4+/5 for  improved stability for walking and lifting her grandchildren    Baseline 4/5 hip strength on Lt side    Time 8    Period Weeks    Status Not Met    Target Date 05/16/21      PT LONG TERM GOAL #6   Title improve NDI to < or = to 30% disability  Baseline 46%- no change due to not attending PT    Time 8    Period Weeks    Status On-going    Target Date 05/16/21                   Plan - 03/25/21 1502     Clinical Impression Statement Pt  tolerates underwaterbicycle today for 2 min, good trunk control. No hip pain or neck pain while exercising in the water. Pt was able to increase her walking speed ( mainly due to lower pool temp. )    Personal Factors and Comorbidities Comorbidity 2    Comorbidities OA bilat feet; fibromyalgia    Stability/Clinical Decision Making Evolving/Moderate complexity    Rehab Potential Excellent    PT Frequency 2x / week    PT Duration 8 weeks    PT Treatment/Interventions ADLs/Self Care Home Management;Cryotherapy;Electrical Stimulation;Iontophoresis 20m/ml Dexamethasone;Moist Heat;Ultrasound;Neuromuscular re-education;Therapeutic exercise;Therapeutic activities;Patient/family education;Manual techniques;Dry needling;Passive range of motion;Taping;Aquatic Therapy    PT Next Visit Plan aquatics 1x/wk and land PT every 3 weeks.    PT Home Exercise Plan Access Code: [email or web address]    Consulted and Agree with Plan of Care Patient             Patient will benefit from skilled therapeutic intervention in order to improve the following deficits and impairments:  Abnormal gait, Increased fascial restricitons, Difficulty walking, Decreased range of motion, Pain, Decreased strength, Postural dysfunction  Visit Diagnosis: Polymyalgia rheumatica ([place]  Muscle weakness (generalized)  Cramp and spasm     Problem List Patient Active Problem List   Diagnosis Date Noted   GAD (generalized anxiety disorder) 12/26/2017   OCD (obsessive compulsive disorder) 12/26/2017   PTSD (post-traumatic stress disorder) 12/26/2017   DDD (degenerative disc disease), cervical 07/14/2016   Primary osteoarthritis of both feet 07/14/2016   Primary osteoarthritis of both hands 07/14/2016   Other fatigue 07/14/2016   History of IBS 07/14/2016   Osteopenia of multiple sites 07/14/2016   Fibromyalgia 01/13/2016   MGUS (monoclonal gammopathy of unknown significance) 12/23/2013   Chronic cholecystitis without calculus 10/18/2012    Abdominal pain, unspecified site 10/18/2012   Nausea alone 10/18/2012   Unspecified constipation 10/18/2012   Depression 10/18/2012   Anxiety    ASCUS (atypical squamous cells of undetermined significance) on Pap smear    IUD    Hemorrhoids 11/29/2010   Abdominal pain, left upper quadrant 11/29/2010    Danielle Bruce, Danielle Bruce 03/25/2021, 3:04 PM  [email or web address] [place] [address]: [phone]  Fax:  [phone] Name: Danielle STROHECKERMRN: [id number] of Birth: [birth year]

## 2021-04-02 ENCOUNTER — Other Ambulatory Visit: Payer: Self-pay | Admitting: Psychiatry

## 2021-04-06 ENCOUNTER — Ambulatory Visit: Payer: Medicare Other | Admitting: Physical Therapy

## 2021-04-06 ENCOUNTER — Ambulatory Visit: Payer: Medicare Other

## 2021-04-07 ENCOUNTER — Ambulatory Visit: Payer: Medicare Other | Admitting: Addiction (Substance Use Disorder)

## 2021-04-12 ENCOUNTER — Ambulatory Visit
Admission: RE | Admit: 2021-04-12 | Discharge: 2021-04-12 | Disposition: A | Discharge status: Home / Self Care | Payer: Medicare Other | Source: Ambulatory Visit | Attending: Family Medicine | Admitting: Family Medicine

## 2021-04-12 DIAGNOSIS — Z1231 Encounter for screening mammogram for malignant neoplasm of breast: Secondary | ICD-10-CM | POA: Diagnosis not present

## 2021-04-13 ENCOUNTER — Ambulatory Visit: Payer: Medicare Other | Attending: Family Medicine | Admitting: Physical Therapy

## 2021-04-13 ENCOUNTER — Other Ambulatory Visit: Payer: Self-pay

## 2021-04-13 ENCOUNTER — Encounter: Payer: Self-pay | Admitting: Physical Therapy

## 2021-04-13 ENCOUNTER — Ambulatory Visit (INDEPENDENT_AMBULATORY_CARE_PROVIDER_SITE_OTHER): Payer: Medicare Other | Admitting: Psychiatry

## 2021-04-13 ENCOUNTER — Encounter: Payer: Self-pay | Admitting: Psychiatry

## 2021-04-13 DIAGNOSIS — F338 Other recurrent depressive disorders: Secondary | ICD-10-CM | POA: Diagnosis not present

## 2021-04-13 DIAGNOSIS — F4310 Post-traumatic stress disorder, unspecified: Secondary | ICD-10-CM

## 2021-04-13 DIAGNOSIS — F331 Major depressive disorder, recurrent, moderate: Secondary | ICD-10-CM | POA: Diagnosis not present

## 2021-04-13 DIAGNOSIS — M6281 Muscle weakness (generalized): Secondary | ICD-10-CM | POA: Diagnosis not present

## 2021-04-13 DIAGNOSIS — F411 Generalized anxiety disorder: Secondary | ICD-10-CM | POA: Diagnosis not present

## 2021-04-13 DIAGNOSIS — R252 Cramp and spasm: Secondary | ICD-10-CM | POA: Diagnosis not present

## 2021-04-13 DIAGNOSIS — F4001 Agoraphobia with panic disorder: Secondary | ICD-10-CM | POA: Diagnosis not present

## 2021-04-13 DIAGNOSIS — M353 Polymyalgia rheumatica: Secondary | ICD-10-CM | POA: Insufficient documentation

## 2021-04-13 DIAGNOSIS — F5081 Binge eating disorder: Secondary | ICD-10-CM

## 2021-04-13 DIAGNOSIS — F5105 Insomnia due to other mental disorder: Secondary | ICD-10-CM | POA: Diagnosis not present

## 2021-04-13 DIAGNOSIS — R293 Abnormal posture: Secondary | ICD-10-CM | POA: Diagnosis not present

## 2021-04-13 DIAGNOSIS — F902 Attention-deficit hyperactivity disorder, combined type: Secondary | ICD-10-CM | POA: Diagnosis not present

## 2021-04-13 DIAGNOSIS — F428 Other obsessive-compulsive disorder: Secondary | ICD-10-CM | POA: Diagnosis not present

## 2021-04-13 MED ORDER — LORAZEPAM 1 MG PO TABS: 1.0000 mg | ORAL_TABLET | Freq: Three times a day (TID) | ORAL | 1 refills | Status: DC | PRN

## 2021-04-13 MED ORDER — LISDEXAMFETAMINE DIMESYLATE 30 MG PO CAPS: 30.0000 mg | ORAL_CAPSULE | Freq: Every day | ORAL | 0 refills | Status: DC

## 2021-04-13 NOTE — Therapy (Signed)
[facility] @ [place], [place], [address] Phone: [phone]   Fax:  [phone]  Physical Therapy Treatment  Patient Details  Name: Danielle Bruce MRN: [id number] Date of Birth: 03/29/1960 Referring Provider (PT): Mayra Neer, MD   Encounter Date: 04/13/2021   PT End of Session - 04/13/21 1317     Visit Number 35    Date for PT Re-Evaluation 04/04/21    Progress Note Due on Visit 2    PT Start Time 1135    PT Stop Time 1215    PT Time Calculation (min) 40 min    Activity Tolerance Patient tolerated treatment well    Behavior During Therapy [facility] for tasks assessed/performed             Past Medical History:  Diagnosis Date   Anemia    Anxiety    Arthritis    osteoarthritis   ASCUS (atypical squamous cells of undetermined significance) on Pap smear 05/06/2005   NEG HIGH RISK HPV--C&B BIOPSY BENIGN 12/2005   Asthma    Bipolar 2 disorder ([place])    Cancer ([place])    skin cancer - basal cell   Colon polyps    Complication of anesthesia    anxious afterwards, will get headaches    Constipation    Depression    Fibromyalgia 10/2013   GERD (gastroesophageal reflux disease)    Hearing loss on left    Heart murmur    never had any problems   Hemorrhoids    High cholesterol    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   cytology negative   IBS (irritable bowel syndrome)    Insomnia    Lymphocytic colitis    Lymphoma ([place])    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   Bone marrow biopsy showes 8% plasma cells IgA Lambda   Migraines    Osteoarthritis    Osteopenia    Peripheral neuropathy    PTSD (post-traumatic stress disorder)     Past Surgical History:  Procedure Laterality Date   BONE MARROW BIOPSY Left 12/18/2013   Plasma cell dyscrasia 8% population of plasma cells   BREAST BIOPSY Right    benign stereo   CESAREAN SECTION  37,90,24   CHOLECYSTECTOMY N/A 10/16/2012    Procedure: LAPAROSCOPIC CHOLECYSTECTOMY;  Surgeon: Joyice Faster. Cornett, MD;  Location: [facility];  Service: General;  Laterality: N/A;   COLONOSCOPY     numerous times   DILATION AND CURETTAGE OF UTERUS     ESOPHAGOGASTRODUODENOSCOPY     HEMORRHOID SURGERY  1993   x3   IUD REMOVAL  02/2015   Mirena   LAPAROSCOPIC LYSIS OF ADHESIONS N/A 10/16/2012   Procedure: LAPAROSCOPIC LYSIS OF ADHESIONS;  Surgeon: Marcello Moores A. Cornett, MD;  Location: [facility];  Service: General;  Laterality: N/A;   LAPAROSCOPY N/A 10/16/2012   Procedure: LAPAROSCOPY DIAGNOSTIC;  Surgeon: Joyice Faster. Cornett, MD;  Location: [facility];  Service: General;  Laterality: N/A;   PELVIC LAPAROSCOPY     RADIOLOGY WITH ANESTHESIA N/A 12/16/2015   Procedure: MRI OF BRAIN WITH AND WITHOUT CONTRAST;  Surgeon: Medication Radiologist, MD;  Location: [facility];  Service: Radiology;  Laterality: N/A;   SHOULDER SURGERY  2007/2008   SPINE SURGERY  2010   cervical    There were no vitals filed for this visit.   Subjective Assessment - 04/13/21 1318     Subjective Went to the beach and did well, was very active.  Yesterday had an insidious onset of Lt anteriorlateral pain that was very sharp/burning. Nothing helped it. Awoke this AM ( just got up 40 min ago) and so far my leg is ok.             Treatment: Pt arrives for aquatic physical therapy. Treatment took place in 3.5-5.5 feet of water. Water temperature was93 degrees F. Pt entered the pool via stairs with mild use of rails. Pt requires buoyancy of water for support and to offload joints with strengthening exercises.    75% depth: Water walking adding yellow noodle for UE push/pull 10x in each direction. High knee marching with neck pillow for core compressions 6 lengths. Wall exercises: 20x hip 3 ways Bil. Horseback underwater bicycle 10 min with UE to increase work. Followed by 2 min of decompression hang. PTA monitored any increase in LT radicular symptoms which there were none.                                  PT Long Term Goals - 03/21/21 1107       PT LONG TERM GOAL #1   Title be independent in advanced HEP with completion 4-5x/wk for 5-10 each    Baseline going to the pool now, going to join gym    Time 8    Status On-going    Target Date 05/16/21      PT LONG TERM GOAL #2   Title Pt will be able to walk at least 1 mile without increased pain so she can return to [place].    Baseline I can walk 1 mile    Status Achieved      PT LONG TERM GOAL #3   Title report 75% less neck/UE pain throughout the day with normal functional activities.    Baseline not as painful and can do more stuff- 50-75% better-pt has been sick so limited progress    Time 8    Period Weeks    Status On-going    Target Date 05/16/21      PT LONG TERM GOAL #4   Title initiate a gym exercise program and participate > or = to 1x/wk    Baseline started aquatic PT and is goint to transition to the gym    Time 8    Period Weeks    Status On-going    Target Date 05/16/21      PT LONG TERM GOAL #5   Title Pt will demonstrate at least hip strength of 4+/5 for  improved stability for walking and lifting her grandchildren    Baseline 4/5 hip strength on Lt side    Time 8    Period Weeks    Status Not Met    Target Date 05/16/21      PT LONG TERM GOAL #6   Title improve NDI to < or = to 30% disability    Baseline 46%- no change due to not attending PT    Time 8    Period Weeks    Status On-going    Target Date 05/16/21                   Plan - 04/13/21 1320     Clinical Impression Statement Pt returns to aquatic PT after being at the beach last week. Pt reports doing well at the beach, was able to do her pool exercises while there. PTA added resisatnce  to water walking which increases the current, challenging LE more. Pt was also able to perform underwater bicycle in horseback position for 10 continuous minutes with no fatigue or hip pain.     Personal Factors and Comorbidities Comorbidity 2    Comorbidities OA bilat feet; fibromyalgia    Stability/Clinical Decision Making Evolving/Moderate complexity    Rehab Potential Excellent    PT Frequency 2x / week    PT Treatment/Interventions ADLs/Self Care Home Management;Cryotherapy;Electrical Stimulation;Iontophoresis 52m/ml Dexamethasone;Moist Heat;Ultrasound;Neuromuscular re-education;Therapeutic exercise;Therapeutic activities;Patient/family education;Manual techniques;Dry needling;Passive range of motion;Taping;Aquatic Therapy    PT Next Visit Plan Pt will new renewal including todays visit. Pt missed her auth period as she was on vacation.    PT Home Exercise Plan Access Code: [email or web address]    Consulted and Agree with Plan of Care Patient             Patient will benefit from skilled therapeutic intervention in order to improve the following deficits and impairments:  Abnormal gait, Increased fascial restricitons, Difficulty walking, Decreased range of motion, Pain, Decreased strength, Postural dysfunction  Visit Diagnosis: Polymyalgia rheumatica ([place]  Muscle weakness (generalized)  Cramp and spasm     Problem List Patient Active Problem List   Diagnosis Date Noted   GAD (generalized anxiety disorder) 12/26/2017   OCD (obsessive compulsive disorder) 12/26/2017   PTSD (post-traumatic stress disorder) 12/26/2017   DDD (degenerative disc disease), cervical 07/14/2016   Primary osteoarthritis of both feet 07/14/2016   Primary osteoarthritis of both hands 07/14/2016   Other fatigue 07/14/2016   History of IBS 07/14/2016   Osteopenia of multiple sites 07/14/2016   Fibromyalgia 01/13/2016   MGUS (monoclonal gammopathy of unknown significance) 12/23/2013   Chronic cholecystitis without calculus 10/18/2012   Abdominal pain, unspecified site 10/18/2012   Nausea alone 10/18/2012   Unspecified constipation 10/18/2012   Depression 10/18/2012   Anxiety    ASCUS (atypical  squamous cells of undetermined significance) on Pap smear    IUD    Hemorrhoids 11/29/2010   Abdominal pain, left upper quadrant 11/29/2010    Marjie Chea, PTA 04/13/2021, 4:14 PM  [email or web address] [email or web address]: [phone]  Fax:  [phone] Name: CSALAH BURLISONMRN: [id number] of Birth: 903-28-1962

## 2021-04-13 NOTE — Progress Notes (Signed)
ANGELENA SAND [id number] [birth year] 61 y.o.   Subjective:   Patient ID:  Danielle Bruce is a 61 y.o. (DOB 1960-10-20) female.  Chief Complaint:  Chief Complaint  Patient presents with   Follow-up    Bipolar II disorder ([place])   Depression   Anxiety   Post-Traumatic Stress Disorder    HPI [name] presents for follow-up of multiple dxes.  visit in April and taken off lamotrigine DT possible skin reaction.  Had appt with [facility] dermatology and they assume that skin reaction with itching was related to lamotrigine. 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 Per oncology most likely drug is lamotrigine. And she weaned off of the medication to determine if the cutaneous lesions are related. Lesions seem less without lamotrigine.  At this point skin reaction is presumed related to lamotrigine.  No other disease sx. She is well aware that she has been on lamotrigine for many years with good results for depression control. There is a risk of relapse as she comes off of the medication and she is worried about that. Smart phone helps her cognitive  And other productivity.    seen October 2020.  Restarted low dosage ADD bc concerns over STM, concentration and productivity.  Ritalin 5 mg twice daily.  Also suggested she retry N-acetylcysteine for mild cognitive complaints.   seen April 07, 2019 the following was noted: Mostly good.  Anxiety is mostly better, but depression is a little worse seasonally.  Not able to go help her daughter.   Low energy but does have motivation.  Ritalin helps energy and focus but doesn't take it regularly bc irregular sleep cycle.   Ativan only when having medical issues.    Therapy working on trauma issues is very hard.  Struggling with the fact she doesn't have memory of large parts of her past.   No manic sx off mood stabilizers.   Covid isolation reduce stressors overall and not prominently depressed.   No meds were changed.  June 25, 2019 appointment the following is noted: F real worried about Covid despite vaccination.   Started fluvoxamine 25 on May 14, 2019 from PCP mainly for inflammation but possibly thinking she might be depressed. Seen benefit for energy and sleep pattern and more appropriate feelings and less numb.  Now realizes she had been depressed for awhile but just numb with depression.  Anniversaries of deaths of friends including last BF [name] a little over a year ago.   Thinks of him daily. His 59 yo D died of unknown cause recently.  Stress D moved to beach. Wonders about increasing the fluvoxamine  [name] living in [place] and hasn't seen him in a year. [name] on disability for depression. Patient reports more depression.  Plan: Realized lately she's depressed and started Luvox again for it and for the anti-inflammatory potential for her health.  Has seen some benefit.   Prior benefit at 25 mg daily.  Optiion increase but slightly bc med sensitive. AGREE increase fluvoxamine to 37.5 mg daily.  08/06/2019 appointment with the following noted: Increased fluvoxamine to 37.5 mg and it helped the depression. Could see a big difference and had been more depressed than she even realized. Outlook better and sleep pattern normalized.    More energy and motivation.   But also started having prolonged HA.  HA lasted a couple of weeks.   Assumed HA was  DT increased fluvoxamine so reduced to 25 mg on 07/28/19.  07/29/19 Episode of confusion occurred also.  Went to doctor while confused.  They couldn't determine cause.    Resolved in 1 day. FM flare up since fall/winter. To beach with daughter 3 mos minimum to give her shots for IVF treatment. Patient reports panic recently and recent difficulty with anxiety.    Occ flashbacks.   Patient denies difficulty with sleep initiation or maintenance. Denies appetite  disturbance.  Patient reports that energy and motivation have been good.  Patient denies any difficulty with concentration.  Patient denies any suicidal ideation. Plan: AGREE increase fluvoxamine to 37.5 mg daily once HA is controlled for several weekcs.  01/01/20 appt with the following noted: Increased fluvoxamine for awhile but got more HA and reduced it. Anxiety is better in general now.  But is interested in increasing it DT coming winter. When anxiety is not good it catches her off guard. Likely had Covid in August but early test was negative.  Got prednisone but sick for 5 weeks.  Lost taste and smell still going on.  D also had Covid.  Felt really sick and terrified with bad mental health at the time.  Was traumatic for her.  Had been vaccinated.   Had panic attack at the ER and took a long time to get the Ativan.  D moving back to area and pt will have to help with childcare so hopes to more aggressively treat her health problems. 2 gkids and 2 more coming. [name] married without kids yet. Plan: started Luvox again for it and for the anti-inflammatory potential for her health.  Has seen some benefit.   Prior benefit at 25 mg daily.  Optiion increase but slightly bc med sensitive. AGREE increase fluvoxamine to 37.5 mg daily once HA is controlled for several weeks.  03/03/2020 appointment with the following noted: On Day 10 of Covid.  2nd bout. A lot easier this time. Had gotten Luvox up to 50 mg daily and tolerated.  Then doubled it to 100 mg daily. Wonders about the proper dose for maintenance.   No taste and smell, exhausted, congestion and mild cough.  Energy is better than it was in the beginning.  Made her depressed and anxious again and that part is better also.  Last time had to go to ER with Covid.  Only ER visit in 2-3 years.  Less migraine than in the past. Overall was doing better with last couple months.  Trying to do anti-inflammatory diet.  Vegetarian, no dairy or gluten.   Natural sugars.   Still some anxiety. D moved back here and pregnant with 2nd set of twins. Current twins are 59 yo.   Will restart trauma therapy and it's helped over the last couple of years.  More freedom from things.      Less daily dread with anxiety but still some panic which can be triggered.  Less OCD with fluvoxamine unless triggered.   Taking Ritalin 10 mg each AM and tolerating and benefiting. occ NM with few dreams overall. Plan: started Luvox again for it and for the anti-inflammatory potential for her health.  Has seen some benefit.   Prior benefit at 25 mg daily.  Med sensitive. AGREE increase fluvoxamine to  50  mg daily. Up to 100 mg daily if desired.   04/22/19 appt with following noted: Mostly good and randomly psycho.  Really busy with Colletta [place] [redacted] weeks pregnant with twins and twins 59 yo  at home.  She's helping and her H is away. Mo older and slower.  Pt overworking.  When with Colletta [place] kids interfere with sleep.  Doesn't eat as well with Colletta [place] affects her FM and energy.   Recently exhausted and needed rest but couldn't go home DT D had to go to hospital.  Her H critical at times and she lost it emotionally over this. Almost suicidal reaction with weird intrusive thoughts in response to the criticism.   Got a break and it helped.   Rheum adjusted Lyrica to BID. Had accident fall with one of babies and was bleeding.  She became hysterical.   Upset now that she couldn't control her emotions at the time.  Couldn't get herself under control to deal with the crisis.    Overall doing ok but doesn't handle stressors well and lose it.   Plan: AGREE with increase fluvoxamine to  50  mg daily. Up to 100 mg daily if desired.  06/23/2020 appointment with the following noted: Increased fluvoxamine 25 AM and 50 mg PM and tolerated it.  Been on this dose awhile.  Going to [place] May 6-19 and son taking her.  Working for [employer] and been successful. Also going to [place], [place].   Excited.   Occ overwhelmed. Starting therapy with Lonn Georgia and looking forward to it and dealing with trauma stuff. Nervous about doing the therapy and getting decompensated like what happened last time. Prednisone hellps mood and body. Not as depressed as in the past.  Sleep is reasonably good.  Appetite is normal.  Anxiety intermittent.  No suicidal thoughts Plan: No med changes  08/19/2020 appointment with the following noted: Not good.  Dog died 2 week ago and was very close to her for 16 years.  She had been a great comfort during losses and health problems.  Dog helped her through periods of SI. Exhausted. [name] in [place] and going in early July. Wonders how long this will last.  Also increased fluvoxamine and now out and having withdrawal. Disc difference between grief and depression. Gwyndolyn Saxon needs therapist who takes Medicaid. Plan: no med changes  01/10/21 appt noted: Had covid 3rd time. Still on fluvox 50, Ritalin 10 mg daily, Deplin, trazodone 50 mg HS. Helping with gkids and been sick a lot since September. Been doing so so overall.  At times is good. Son-in-law will be getting raise and D can quit work for a year or so. [name] and his wife in [place] and that's good. Parents getting old. Both parents hearing problems and M more forgetful. Parents personality different.  Less therapy lately bc schedules.  Has dealt with a lot of trauma and will resume after new year and feels it has been helpful.  04/13/21 appt noted: More problems with appetite increase for a couple of years.  Always hungry.  Wonders about changing stimulant for this.  Weight up too much. Needs to lose 15+ # DT chronic pain including hips and knees. More depression.  Seasonally.  Episodic panic and anxiety chronic. Some intermittent conflict with daughter.   Needs Ativan intermittently. Still working through trauma issues. Continues therapy.   Haven't gotten back into church.  Not wanting to go out much.  Past  psych meds: Lithium, carbamazepine, oxcarbazepine, topiramate, valproic acid, lamotrigine, gabapentin  Zyprexa, Seroquel, Latuda headaches, Geodon,  Risperidone, Stelazine,  Rexulti,  Wellbutrin with side effects,  sertraline irritability, duloxetine,  fluvoxamine 100 with Covid, fluoxetine, Stopped desipramine DT skin issues which now resolved.  It  helped GI px.  buspirone side effects,   N-acetylcysteine,  pramipexole,  methylphenidate,  Adderall,   Ambien with amnesia, trazodone, Ativan  Review of Systems:  Review of Systems  HENT:  Negative for congestion and voice change.   Gastrointestinal:  Positive for abdominal pain. Negative for nausea.  Musculoskeletal:  Positive for back pain and myalgias.  Neurological:  Negative for tremors and weakness. Less HA with new meds.  Medications: I have reviewed the patient's current medications.  Current Outpatient Medications  Medication Sig Dispense Refill   albuterol (PROVENTIL HFA;VENTOLIN HFA) 108 (90 Base) MCG/ACT inhaler Inhale 2 puffs into the lungs every 6 (six) hours as needed for wheezing or shortness of breath.     Ascorbic Acid (VITAMIN C) 100 MG tablet Take 100 mg by mouth daily.     Azelastine HCl 0.15 % SOLN Place 2 sprays into both nostrils daily.     BEPREVE 1.5 % SOLN Place 1 drop into both eyes daily.      Calcium Carb-Cholecalciferol (CALCIUM 1000 + D) 1000-20 MG-MCG TABS Take by mouth.     Coenzyme Q10 (CO Q 10 PO) Take by mouth daily.     cyclobenzaprine (FLEXERIL) 10 MG tablet Take 10 mg by mouth at bedtime as needed.     diphenhydrAMINE (BENADRYL) 25 mg capsule Take 50 mg by mouth every 6 (six) hours as needed (HEADACHES).     Erenumab-aooe 70 MG/ML SOAJ Inject into the skin every 28 (twenty-eight) days.     fluticasone (FLONASE) 50 MCG/ACT nasal spray Place 2 sprays into both nostrils daily. 16 g 0   fluvoxaMINE (LUVOX) 50 MG tablet Take 1 tablet (50 mg total) by mouth 2 (two) times daily. 180 tablet 1    hydrocortisone (ANUSOL-HC) 25 MG suppository 1 suppository     ibuprofen (ADVIL) 800 MG tablet 1 tablet with food or milk as needed     L-methylfolate Calcium 15 MG TABS TAKE 1 TABLET BY MOUTH DAILY 30 tablet 11   levocetirizine (XYZAL) 5 MG tablet Take 5 mg by mouth every evening.  5   lisdexamfetamine (VYVANSE) 30 MG capsule Take 1 capsule (30 mg total) by mouth daily. 30 capsule 0   methocarbamol (ROBAXIN) 500 MG tablet Take 1 tablet (500 mg total) by mouth daily as needed for muscle spasms. 30 tablet 1   methylphenidate (RITALIN) 10 MG tablet Take 1 tablet (10 mg total) by mouth 2 (two) times daily with breakfast and lunch. 60 tablet 0   methylphenidate (RITALIN) 10 MG tablet Take 1 tablet (10 mg total) by mouth 2 (two) times daily. 60 tablet 0   montelukast (SINGULAIR) 10 MG tablet Take 10 mg by mouth at bedtime.     NURTEC 75 MG TBDP Take 1 tablet by mouth daily as needed.     oxyCODONE-acetaminophen (PERCOCET/ROXICET) 5-325 MG per tablet Take 2 tablets by mouth daily as needed for moderate pain or severe pain (mighraine.).      polyethylene glycol (MIRALAX / GLYCOLAX) packet Take 17 g by mouth daily as needed for mild constipation.     pravastatin (PRAVACHOL) 40 MG tablet 1 tablet     pregabalin (LYRICA) 50 MG capsule Take 100 mg by mouth at bedtime. 1 tab in am and 1 tab HS.     PROLIA 60 MG/ML SOSY injection BRING TO THE OFFICE FOR INJECTION ON 02/15/2018 AS DIRECTED ONCE EVERY 6 MONTHS     promethazine (PHENERGAN) 25 MG tablet Take 25 mg by mouth every 6 (six)  hours as needed for nausea.     SYMBICORT 80-4.5 MCG/ACT inhaler SMARTSIG:2 Puff(s) By Mouth Twice Daily PRN     traZODone (DESYREL) 50 MG tablet TAKE 1 TABLET(50 MG) BY MOUTH AT BEDTIME 90 tablet 1   TURMERIC PO Take by mouth daily.     VASCEPA 1 g CAPS TK 2 CS PO BID WC  3   Vitamin A 2400 MCG (8000 UT) CAPS 1 capsule with food or milk     VITAMIN D PO Take by mouth. Take 5077m daily     VOLTAREN 1 % GEL Apply 2 g topically 4  (four) times daily as needed (JOINT PAIN).   3   Wheat Dextrin (BENEFIBER PO) 1 Tablespoon     azelastine (ASTELIN) 0.1 % nasal spray Place into both nostrils 2 (two) times daily. Use in each nostril as directed (Patient not taking: Reported on 02/03/2021)     EPINEPHrine 0.3 mg/0.3 mL IJ SOAJ injection as needed. (Patient not taking: Reported on 04/13/2021)     LORazepam (ATIVAN) 1 MG tablet Take 1 tablet (1 mg total) by mouth every 8 (eight) hours as needed for anxiety. (takes 2 mg when having a medical procedure.) 30 tablet 1   LORazepam (ATIVAN) 2 MG/ML concentrated solution Take 0.5 mLs (1 mg total) by mouth every 8 (eight) hours as needed for anxiety (severe panic). 30 mL 1   No current facility-administered medications for this visit.    Medication Side Effects: None  Allergies:  Allergies  Allergen Reactions   Sulfa Antibiotics Nausea And Vomiting    Causes pt to vomit blood Other reaction(s): vomiting blood   Gluten Meal Other (See Comments)    Sensitivity.    Lactose Intolerance (Gi) Nausea And Vomiting   Aspirin     Other reaction(s): colitis   Bromfed Dm [Pseudoeph-Bromphen-Dm]     Other reaction(s): strung out and hallucinating   Chlorpromazine     Other reaction(s): Dark thoughts   Desipramine     Other reaction(s): rash   Gabapentin     Other reaction(s): swelling / fuzzy vision   Hydrocodone     Other reaction(s): causes hangovers   Lamotrigine     Other reaction(s): rash   Metronidazole     Other reaction(s): vomiting blood   Morphine Sulfate     Other reaction(s): tearful   Nsaids     Other reaction(s): colitis   Flagyl [Metronidazole Hcl] Nausea And Vomiting   Morphine And Related Other (See Comments)    Reaction: Depression, emotional    Past Medical History:  Diagnosis Date   Anemia    Anxiety    Arthritis    osteoarthritis   ASCUS (atypical squamous cells of undetermined significance) on Pap smear 05/06/2005   NEG HIGH RISK HPV--C&B BIOPSY  BENIGN 12/2005   Asthma    Bipolar 2 disorder ([place]    Cancer ([place]    skin cancer - basal cell   Colon polyps    Complication of anesthesia    anxious afterwards, will get headaches    Constipation    Depression    Fibromyalgia 10/2013   GERD (gastroesophageal reflux disease)    Hearing loss on left    Heart murmur    never had any problems   Hemorrhoids    High cholesterol    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   cytology negative   IBS (irritable bowel syndrome)    Insomnia    Lymphocytic colitis    [place]  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   Bone marrow biopsy showes 8% plasma cells IgA Lambda   Migraines    Osteoarthritis    Osteopenia    Peripheral neuropathy    PTSD (post-traumatic stress disorder)     Family History  Problem Relation Age of Onset   Diabetes Father    Hypertension Father    Hyperlipidemia Father    Depression Daughter    Anxiety disorder Daughter    Asthma Daughter    Heart disease Maternal Grandfather    Heart disease Paternal Grandmother    Heart disease Paternal Grandfather    Depression Son    Depression Son    Anxiety disorder Son    Breast cancer Neg Hx     Social History   Socioeconomic History   Marital status: Divorced    Spouse name: Not on file   Number of children: 3   Years of education: Not on file   Highest education level: Not on file  Occupational History   Not on file  Tobacco Use   Smoking status: Former    Types: Cigarettes    Quit date: 05/13/2014    Years since quitting: 6.9   Smokeless tobacco: Never   Tobacco comments:    social   Vaping Use   Vaping Use: Never used  Substance and Sexual Activity   Alcohol use: Yes    Alcohol/week: 0.0 standard drinks    Comment: rare   Drug use: Never   Sexual activity: Not Currently    Comment: -1st intercourse 61 yo-More than 5 partners  Other Topics Concern   Not on file  Social History Narrative   Not on file   Social  Determinants of Health   Financial Resource Strain: Not on file  Food Insecurity: Not on file  Transportation Needs: Not on file  Physical Activity: Not on file  Stress: Not on file  Social Connections: Not on file  Intimate Partner Violence: Not on file    Past Medical History, Surgical history, Social history, and Family history were reviewed and updated as appropriate.   Please see review of systems for further details on the patient's review from today.   Objective:   Physical Exam:  LMP 12/28/2010   Physical Exam Constitutional:      General: She is not in acute distress. Musculoskeletal:        General: No deformity.  Neurological:     Mental Status: She is alert and oriented to person, place, and time.     Coordination: Coordination normal.  Psychiatric:        Attention and Perception: Attention and perception normal. She does not perceive auditory or visual hallucinations.        Mood and Affect: Mood is anxious and depressed. Affect is not labile, blunt, angry, tearful or inappropriate.        Speech: Speech normal.        Behavior: Behavior normal.        Thought Content: Thought content normal. Thought content is not paranoid or delusional. Thought content does not include homicidal or suicidal ideation. Thought content does not include suicidal plan.        Cognition and Memory: Cognition and memory normal.        Judgment: Judgment normal.     Comments: Insight intact     Lab Review:     Component Value Date/Time   NA 142 08/01/2016 1713   NA 144 07/12/2015 1521  K 4.7 08/01/2016 1713   K 3.8 07/12/2015 1521   CL 107 08/01/2016 1713   CO2 23 08/01/2016 1713   CO2 27 07/12/2015 1521   GLUCOSE 95 08/01/2016 1713   GLUCOSE 82 07/12/2015 1521   BUN 14 08/01/2016 1713   BUN 10.3 07/12/2015 1521   CREATININE 0.87 08/01/2016 1713   CREATININE 0.8 07/12/2015 1521   CALCIUM 10.0 08/01/2016 1713   CALCIUM 9.9 07/12/2015 1521   PROT 7.4 08/01/2016 1713    PROT 7.4 07/12/2015 1521   ALBUMIN 4.8 08/01/2016 1713   ALBUMIN 4.4 07/12/2015 1521   AST 23 08/01/2016 1713   AST 22 07/12/2015 1521   ALT 26 08/01/2016 1713   ALT 31 07/12/2015 1521   ALKPHOS 55 08/01/2016 1713   ALKPHOS 54 07/12/2015 1521   BILITOT 0.7 08/01/2016 1713   BILITOT 0.40 07/12/2015 1521   GFRNONAA 76 08/01/2016 1713   GFRAA 88 08/01/2016 1713       Component Value Date/Time   WBC 5.9 08/01/2016 1713   RBC 4.71 08/01/2016 1713   HGB 14.6 08/01/2016 1713   HGB 14.1 07/12/2015 1521   HCT 43.1 08/01/2016 1713   HCT 41.5 07/12/2015 1521   PLT 254 08/01/2016 1713   PLT 248 07/12/2015 1521   MCV 91.5 08/01/2016 1713   MCV 92.2 07/12/2015 1521   MCH 31.0 08/01/2016 1713   MCHC 33.9 08/01/2016 1713   RDW 13.2 08/01/2016 1713   RDW 13.3 07/12/2015 1521   LYMPHSABS 2,006 08/01/2016 1713   LYMPHSABS 1.8 07/12/2015 1521   MONOABS 354 08/01/2016 1713   MONOABS 0.7 07/12/2015 1521   EOSABS 118 08/01/2016 1713   EOSABS 0.1 07/12/2015 1521   BASOSABS 0 08/01/2016 1713   BASOSABS 0.0 07/12/2015 1521    No results found for: POCLITH, LITHIUM   No results found for: PHENYTOIN, PHENOBARB, VALPROATE, CBMZ   .res Assessment: Plan:    Ayani was seen today for follow-up, depression, anxiety and post-traumatic stress disorder.  Diagnoses and all orders for this visit:  Major depressive disorder, recurrent episode, moderate (HCC)  Attention deficit hyperactivity disorder (ADHD), combined type -     lisdexamfetamine (VYVANSE) 30 MG capsule; Take 1 capsule (30 mg total) by mouth daily.  Binge-eating disorder, moderate -     lisdexamfetamine (VYVANSE) 30 MG capsule; Take 1 capsule (30 mg total) by mouth daily.  Generalized anxiety disorder -     LORazepam (ATIVAN) 1 MG tablet; Take 1 tablet (1 mg total) by mouth every 8 (eight) hours as needed for anxiety. (takes 2 mg when having a medical procedure.)  Panic disorder with agoraphobia -     LORazepam (ATIVAN) 1 MG  tablet; Take 1 tablet (1 mg total) by mouth every 8 (eight) hours as needed for anxiety. (takes 2 mg when having a medical procedure.)  PTSD (post-traumatic stress disorder)  Seasonal affective disorder ([place])  Obsessional thoughts  Insomnia due to mental condition   Past couple of years more obvious PTSD obvious has called into doubt the bipolar dix bc she's remained mood stable off the lamotrigine.    She has had symptoms consistent with hypomania in the past but there is now some question as to whether they were PTSD related rather than truly bipolar 2.  Her son has had treatment resistant depression with possible bipolar disorder and her daughter has been diagnosed with bipolar disorder but also a history of borderline personality disorder.  So there is a family history of the spectrum.  started Luvox again for it and for the anti-inflammatory potential for her health.  Has seen some benefit.   Prior benefit Taking fluvoxamine 50 mg BID Clear benefit from fluvoxamine for mood quite significantly. No manic sx. Disc anti-inflammatory effects of fluvoxamine at doses of 100 mg BID for Covid.  2 studies proving benefit so far. HA are better.   Stress management for mood sx. Health problems worsen mood episodically.   Disc stress son's mental health problems.  CBT around agoraphobia.  Need to push herself to get out more.  We discussed the short-term risks associated with benzodiazepines including sedation and increased fall risk among others.  Discussed long-term side effect risk including dependence, potential withdrawal symptoms, and the potential eventual dose-related risk of dementia.  Keep this at a minimum given cognitive concerns.  She only uses it when triggered with anxiety usually medical.  She is not using it regularly.  Discussed potential benefits, risks, and side effects of stimulants with patient to include increased heart rate, palpitations, insomnia, increased anxiety,  increased irritability, or decreased appetite.  Instructed patient to contact office if experiencing any significant tolerability issues. Benefit  Ritalin 10 mg AM and noon for cognitive and ADD reasons and disc risk of palpitations.  Per request ok trial Vyvanse for binge eating and ADD instead of Ritalin per PCP suggestion May also help mood and weight loss should help pain. Caution it could cause anxiety  Continue therapy.  She is working well on things.   Disc seasonal depression  Rec check vitamin D level bc makes susceptible to seasonal depression, cognitive problems, viral illnesses  FU 3 mos  Lynder Parents, MD, DFAPA    Please see After Visit Summary for patient specific instructions.  Future Appointments  Date Time Provider [place]  04/14/2021 11:00 AM [place], [place], PT [facility] None  04/20/2021 10:15 AM Myrene Galas L, PTA [facility] None  04/21/2021 10:00 AM [name], LCSW [facility] None  04/29/2021  2:30 PM Altamese Dilling, PTA [facility] None  05/04/2021 11:00 AM Altamese Dilling, PTA [facility] None  05/05/2021 11:00 AM Danie Binder, PT [facility] None  05/11/2021 11:45 AM Altamese Dilling, PTA [facility] None  05/18/2021 11:45 AM Altamese Dilling, PTA [facility] None  05/25/2021 11:45 AM Altamese Dilling, PTA [facility] None  05/26/2021 11:00 AM Danie Binder, PT [facility] None  06/15/2021  1:00 PM [name], LCSW [facility] None  06/29/2021  2:00 PM [name], LCSW [facility] None  07/26/2021  2:00 PM [name], LCSW [facility] None  08/04/2021 10:45 AM Bo Merino, MD [facility] None  08/05/2021  1:30 PM [facility] DX DEXA 1 [facility] GI-BREAST CE  08/10/2021 11:00 AM [name], LCSW [facility] None    No orders of the defined types were placed in this encounter.      -------------------------------

## 2021-04-14 ENCOUNTER — Encounter: Payer: Self-pay | Admitting: Rehabilitative and Restorative Service Providers"

## 2021-04-14 ENCOUNTER — Ambulatory Visit: Payer: Medicare Other | Attending: Family Medicine | Admitting: Rehabilitative and Restorative Service Providers"

## 2021-04-14 DIAGNOSIS — M6281 Muscle weakness (generalized): Secondary | ICD-10-CM | POA: Diagnosis not present

## 2021-04-14 DIAGNOSIS — M353 Polymyalgia rheumatica: Secondary | ICD-10-CM | POA: Insufficient documentation

## 2021-04-14 DIAGNOSIS — R252 Cramp and spasm: Secondary | ICD-10-CM | POA: Insufficient documentation

## 2021-04-14 DIAGNOSIS — R293 Abnormal posture: Secondary | ICD-10-CM | POA: Insufficient documentation

## 2021-04-14 NOTE — Therapy (Signed)
[facility] @ [place], [place], [address] Phone: [phone]   Fax:  [phone]  Physical Therapy Treatment  Patient Details  Name: RANATA LAUGHERY MRN: [id number] Date of Birth: [birth year] Referring Provider (PT): Mayra Neer, MD   Encounter Date: 04/14/2021   PT End of Session - 04/14/21 1106     Visit Number 36    Date for PT Re-Evaluation 04/04/21    Authorization Type Medicare/Medicaid- KX needed at 15 for 2023    Progress Note Due on Visit 40    PT Start Time 1100    PT Stop Time 1140    PT Time Calculation (min) 40 min    Activity Tolerance Patient tolerated treatment well    Behavior During Therapy [facility] for tasks assessed/performed             Past Medical History:  Diagnosis Date   Anemia    Anxiety    Arthritis    osteoarthritis   ASCUS (atypical squamous cells of undetermined significance) on Pap smear 05/06/2005   NEG HIGH RISK HPV--C&B BIOPSY BENIGN 12/2005   Asthma    Bipolar 2 disorder ([place])    Cancer ([place])    skin cancer - basal cell   Colon polyps    Complication of anesthesia    anxious afterwards, will get headaches    Constipation    Depression    Fibromyalgia 10/2013   GERD (gastroesophageal reflux disease)    Hearing loss on left    Heart murmur    never had any problems   Hemorrhoids    High cholesterol    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   cytology negative   IBS (irritable bowel syndrome)    Insomnia    Lymphocytic colitis    Lymphoma ([place])    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   Bone marrow biopsy showes 8% plasma cells IgA Lambda   Migraines    Osteoarthritis    Osteopenia    Peripheral neuropathy    PTSD (post-traumatic stress disorder)     Past Surgical History:  Procedure Laterality Date   BONE MARROW BIOPSY Left 12/18/2013   Plasma cell dyscrasia 8% population of plasma cells   BREAST BIOPSY Right    benign stereo    CESAREAN SECTION  94,32,76   CHOLECYSTECTOMY N/A 10/16/2012   Procedure: LAPAROSCOPIC CHOLECYSTECTOMY;  Surgeon: Joyice Faster. Cornett, MD;  Location: [facility];  Service: General;  Laterality: N/A;   COLONOSCOPY     numerous times   DILATION AND CURETTAGE OF UTERUS     ESOPHAGOGASTRODUODENOSCOPY     HEMORRHOID SURGERY  1993   x3   IUD REMOVAL  02/2015   Mirena   LAPAROSCOPIC LYSIS OF ADHESIONS N/A 10/16/2012   Procedure: LAPAROSCOPIC LYSIS OF ADHESIONS;  Surgeon: Marcello Moores A. Cornett, MD;  Location: [facility];  Service: General;  Laterality: N/A;   LAPAROSCOPY N/A 10/16/2012   Procedure: LAPAROSCOPY DIAGNOSTIC;  Surgeon: Joyice Faster. Cornett, MD;  Location: [facility];  Service: General;  Laterality: N/A;   PELVIC LAPAROSCOPY     RADIOLOGY WITH ANESTHESIA N/A 12/16/2015   Procedure: MRI OF BRAIN WITH AND WITHOUT CONTRAST;  Surgeon: Medication Radiologist, MD;  Location: [facility];  Service: Radiology;  Laterality: N/A;   SHOULDER SURGERY  2007/2008   SPINE SURGERY  2010   cervical    There were no vitals filed for this visit.   Subjective Assessment - 04/14/21 1107  Subjective Pt reports that she is having increased pain in her cervical region and also having some GI pain.    Pertinent History goes by "[name]" ; OA bilateral feet; fibromyalgia; off cycle of steriod medicine currenlty    Patient Stated Goals Pt wants pain management and be able to move more without taking medication.    Currently in Pain? Yes    Pain Score 4     Pain Location Neck    Pain Orientation Mid    Pain Descriptors / Indicators Tightness;Sore    Pain Type Chronic pain                               [facility] Adult PT Treatment/Exercise - 04/14/21 0001       Lumbar Exercises: Stretches   Active Hamstring Stretch Left;3 reps;20 seconds    Active Hamstring Stretch Limitations added in sciatic nerve floss      Manual Therapy   Manual Therapy Soft tissue mobilization;Myofascial release    Manual therapy  comments elongation to bil neck and upper traps, lumbar spine, L piriformis, L hamstrings    Soft tissue mobilization elongation to bil neck and upper traps, lumbar spine, L piriformis, L hamstrings    Myofascial Release manual trigger point release to L upper trap, rhomboids, L piriformis                       PT Short Term Goals - 03/29/20 1030       PT SHORT TERM GOAL #1   Title be independent in initial HEP    Status Achieved               PT Long Term Goals - 04/14/21 1205       PT LONG TERM GOAL #1   Title be independent in advanced HEP with completion 4-5x/wk for 5-10 each    Status On-going      PT LONG TERM GOAL #3   Title report 75% less neck/UE pain throughout the day with normal functional activities.    Status On-going      PT LONG TERM GOAL #4   Title initiate a gym exercise program and participate > or = to 1x/wk    Status On-going                   Plan - 04/14/21 1156     Clinical Impression Statement [name] presents reporting increased cervical pain radiating between her scapula pain today and requesting no dry needling.  Pt was able to tolerate manual therapy, at times needing to decrease pressure for trigger point release secondary to pt pain tolerance.  Noted tightness in L hamstring and trigger points.  Followed manual therapy with hamstring stretching and added sciatic nerve floss.  Pt reported decreased pain following treatment and pain level decreased to 2/10.  Pt provided with handouts of new stretches.  Pt continues to require skilled PT to progress towards goal related activities.    Personal Factors and Comorbidities Comorbidity 2    Comorbidities OA bilat feet; fibromyalgia    PT Treatment/Interventions ADLs/Self Care Home Management;Cryotherapy;Electrical Stimulation;Iontophoresis 60m/ml Dexamethasone;Moist Heat;Ultrasound;Neuromuscular re-education;Therapeutic exercise;Therapeutic activities;Patient/family  education;Manual techniques;Dry needling;Passive range of motion;Taping;Aquatic Therapy    PT Next Visit Plan aquatic PT, strengthening, core stability, flexibility.    PT Home Exercise Plan Access Code: [email or web address]    Consulted and Agree with Plan of Care Patient  Patient will benefit from skilled therapeutic intervention in order to improve the following deficits and impairments:  Abnormal gait, Increased fascial restricitons, Difficulty walking, Decreased range of motion, Pain, Decreased strength, Postural dysfunction  Visit Diagnosis: Polymyalgia rheumatica ([place])  Muscle weakness (generalized)  Cramp and spasm  Abnormal posture     Problem List Patient Active Problem List   Diagnosis Date Noted   GAD (generalized anxiety disorder) 12/26/2017   OCD (obsessive compulsive disorder) 12/26/2017   PTSD (post-traumatic stress disorder) 12/26/2017   DDD (degenerative disc disease), cervical 07/14/2016   Primary osteoarthritis of both feet 07/14/2016   Primary osteoarthritis of both hands 07/14/2016   Other fatigue 07/14/2016   History of IBS 07/14/2016   Osteopenia of multiple sites 07/14/2016   Fibromyalgia 01/13/2016   MGUS (monoclonal gammopathy of unknown significance) 12/23/2013   Chronic cholecystitis without calculus 10/18/2012   Abdominal pain, unspecified site 10/18/2012   Nausea alone 10/18/2012   Unspecified constipation 10/18/2012   Depression 10/18/2012   Anxiety    ASCUS (atypical squamous cells of undetermined significance) on Pap smear    IUD    Hemorrhoids 11/29/2010   Abdominal pain, left upper quadrant 11/29/2010    Juel Burrow, PT, DPT 04/14/2021, 12:06 PM  [facility] @ [address] [place], [place], [address] Phone: [phone]   Fax:  [phone]  Name: RUTHA MELGOZA MRN: [id number] Date of Birth: September 06, 1960

## 2021-04-20 ENCOUNTER — Encounter: Payer: Self-pay | Admitting: Physical Therapy

## 2021-04-20 ENCOUNTER — Other Ambulatory Visit: Payer: Self-pay

## 2021-04-20 ENCOUNTER — Ambulatory Visit: Payer: Medicare Other | Admitting: Physical Therapy

## 2021-04-20 DIAGNOSIS — M353 Polymyalgia rheumatica: Secondary | ICD-10-CM

## 2021-04-20 DIAGNOSIS — M6281 Muscle weakness (generalized): Secondary | ICD-10-CM | POA: Diagnosis not present

## 2021-04-20 DIAGNOSIS — R293 Abnormal posture: Secondary | ICD-10-CM | POA: Diagnosis not present

## 2021-04-20 DIAGNOSIS — R252 Cramp and spasm: Secondary | ICD-10-CM | POA: Diagnosis not present

## 2021-04-20 NOTE — Therapy (Signed)
[facility] @ [place], [place], [address] Phone: [phone]   Fax:  [phone]  Physical Therapy Treatment  Patient Details  Name: Danielle Bruce MRN: [id number] Date of Birth: 1960-10-16 Referring Provider (PT): Mayra Neer, MD   Encounter Date: 04/20/2021   PT End of Session - 04/20/21 1007     Visit Number 37    Date for PT Re-Evaluation 04/04/21    Authorization Type Medicare/Medicaid- KX needed at 49 for 2023    Progress Note Due on Visit 40    PT Start Time 1007    PT Stop Time 1050    PT Time Calculation (min) 43 min    Activity Tolerance Patient tolerated treatment well    Behavior During Therapy [facility] for tasks assessed/performed             Past Medical History:  Diagnosis Date   Anemia    Anxiety    Arthritis    osteoarthritis   ASCUS (atypical squamous cells of undetermined significance) on Pap smear 05/06/2005   NEG HIGH RISK HPV--C&B BIOPSY BENIGN 12/2005   Asthma    Bipolar 2 disorder ([place])    Cancer ([place])    skin cancer - basal cell   Colon polyps    Complication of anesthesia    anxious afterwards, will get headaches    Constipation    Depression    Fibromyalgia 10/2013   GERD (gastroesophageal reflux disease)    Hearing loss on left    Heart murmur    never had any problems   Hemorrhoids    High cholesterol    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   cytology negative   IBS (irritable bowel syndrome)    Insomnia    Lymphocytic colitis    Lymphoma ([place])    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   Bone marrow biopsy showes 8% plasma cells IgA Lambda   Migraines    Osteoarthritis    Osteopenia    Peripheral neuropathy    PTSD (post-traumatic stress disorder)     Past Surgical History:  Procedure Laterality Date   BONE MARROW BIOPSY Left 12/18/2013   Plasma cell dyscrasia 8% population of plasma cells   BREAST BIOPSY Right    benign stereo    CESAREAN SECTION  03,88,82   CHOLECYSTECTOMY N/A 10/16/2012   Procedure: LAPAROSCOPIC CHOLECYSTECTOMY;  Surgeon: Joyice Faster. Cornett, MD;  [facility];  Service: General;  Laterality: N/A;   COLONOSCOPY     numerous times   DILATION AND CURETTAGE OF UTERUS     ESOPHAGOGASTRODUODENOSCOPY     HEMORRHOID SURGERY  1993   x3   IUD REMOVAL  02/2015   Mirena   LAPAROSCOPIC LYSIS OF ADHESIONS N/A 10/16/2012   Procedure: LAPAROSCOPIC LYSIS OF ADHESIONS;  Surgeon: Marcello Moores A. Cornett, MD;  [facility];  Service: General;  Laterality: N/A;   LAPAROSCOPY N/A 10/16/2012   Procedure: LAPAROSCOPY DIAGNOSTIC;  Surgeon: Joyice Faster. Cornett, MD;  [facility];  Service: General;  Laterality: N/A;   PELVIC LAPAROSCOPY     RADIOLOGY WITH ANESTHESIA N/A 12/16/2015   Procedure: MRI OF BRAIN WITH AND WITHOUT CONTRAST;  Surgeon: Medication Radiologist, MD;  Location: [facility];  Service: Radiology;  Laterality: N/A;   SHOULDER SURGERY  2007/2008   SPINE SURGERY  2010   cervical    There were no vitals filed for this visit.   Subjective Assessment - 04/20/21 1008  Subjective After the last pool session my Lthip and thigh were much better. No further flare ups.    Pertinent History goes by "[name]" ; OA bilateral feet; fibromyalgia; off cycle of steriod medicine currenlty    Currently in Pain? --   Just some normal aches/pains random twinges.             Treatment: Pt arrives for aquatic physical therapy. Treatment took place in 3.5-5.5 feet of water. Water temperature was 92 degrees F . Pt entered the pool via stairs with mild use of the rails. Pt requires buoyancy of water for support and to offload joints with strengthening exercises.  Pt utilizes viscosity of the water required for strengthening.   Standing in 50%- 75% depth water pt performed water walking in all 4 directions 10x. VC for speed in order to generate appropriate current for resistance and/or UE movements. Hand paddles added for  side stepping.  Yellow noodle for resisted push/pull for forward & backward.  50% depth for standing TE including: single buoy UE weights pressing to the floor and holding 15 sec: 4x Then adding marching 20x 2 while pushing the same weights down. Horseback bicycle x 10 min with some UE movements.                             PT Short Term Goals - 03/29/20 1030       PT SHORT TERM GOAL #1   Title be independent in initial HEP    Status Achieved               PT Long Term Goals - 04/14/21 1205       PT LONG TERM GOAL #1   Title be independent in advanced HEP with completion 4-5x/wk for 5-10 each    Status On-going      PT LONG TERM GOAL #3   Title report 75% less neck/UE pain throughout the day with normal functional activities.    Status On-going      PT LONG TERM GOAL #4   Title initiate a gym exercise program and participate > or = to 1x/wk    Status On-going                   Plan - 04/20/21 1256     Clinical Impression Statement Pt reports her Lt thigh and hip symptoms resolved after last pool session. Pt did report her LT hip was "talking to her" at the conclusion of todays session. Pt is slowly adding resistance to her activites most notably her core. Pain did not increase while in the pool.    Personal Factors and Comorbidities Comorbidity 2    Comorbidities OA bilat feet; fibromyalgia    Stability/Clinical Decision Making Evolving/Moderate complexity    Rehab Potential Excellent    PT Frequency 2x / week    PT Duration 8 weeks    PT Treatment/Interventions ADLs/Self Care Home Management;Cryotherapy;Electrical Stimulation;Iontophoresis 61m/ml Dexamethasone;Moist Heat;Ultrasound;Neuromuscular re-education;Therapeutic exercise;Therapeutic activities;Patient/family education;Manual techniques;Dry needling;Passive range of motion;Taping;Aquatic Therapy    PT Next Visit Plan aquatic PT, strengthening, core stability, flexibility.    PT  Home Exercise Plan Access Code: [email or web address]    Consulted and Agree with Plan of Care Patient             Patient will benefit from skilled therapeutic intervention in order to improve the following deficits and impairments:  Abnormal gait, Increased fascial restricitons, Difficulty walking, Decreased range of  motion, Pain, Decreased strength, Postural dysfunction  Visit Diagnosis: Polymyalgia rheumatica (HCC)  Muscle weakness (generalized)     Problem List Patient Active Problem List   Diagnosis Date Noted   GAD (generalized anxiety disorder) 12/26/2017   OCD (obsessive compulsive disorder) 12/26/2017   PTSD (post-traumatic stress disorder) 12/26/2017   DDD (degenerative disc disease), cervical 07/14/2016   Primary osteoarthritis of both feet 07/14/2016   Primary osteoarthritis of both hands 07/14/2016   Other fatigue 07/14/2016   History of IBS 07/14/2016   Osteopenia of multiple sites 07/14/2016   Fibromyalgia 01/13/2016   MGUS (monoclonal gammopathy of unknown significance) 12/23/2013   Chronic cholecystitis without calculus 10/18/2012   Abdominal pain, unspecified site 10/18/2012   Nausea alone 10/18/2012   Unspecified constipation 10/18/2012   Depression 10/18/2012   Anxiety    ASCUS (atypical squamous cells of undetermined significance) on Pap smear    IUD    Hemorrhoids 11/29/2010   Abdominal pain, left upper quadrant 11/29/2010    Katryn Plummer, PTA 04/20/2021, 1:00 PM  [facility] @ [place], [place], [address] Phone: [phone]   Fax:  [phone]  Name: Danielle Bruce MRN: [id number] Date of Birth: 1960-10-31

## 2021-04-21 ENCOUNTER — Telehealth: Payer: Self-pay

## 2021-04-21 ENCOUNTER — Ambulatory Visit (INDEPENDENT_AMBULATORY_CARE_PROVIDER_SITE_OTHER): Payer: Medicare Other | Admitting: Addiction (Substance Use Disorder)

## 2021-04-21 DIAGNOSIS — F331 Major depressive disorder, recurrent, moderate: Secondary | ICD-10-CM

## 2021-04-21 DIAGNOSIS — F428 Other obsessive-compulsive disorder: Secondary | ICD-10-CM | POA: Diagnosis not present

## 2021-04-21 NOTE — Progress Notes (Signed)
[facility] Counselor/Therapist Progress Note  Patient ID: Danielle Bruce, MRN: [id number],    Date: 04/21/2021  Time Spent: 65mins  Treatment Type: Individual Therapy  Reported Symptoms: stressed, anxious.   Mental Status Exam:   Appearance:   Casual and Well Groomed     Behavior:  Sharing  Motor:  Normal  Speech/Language:   Clear and Coherent and Normal Rate  Affect:  Appropriate and Full Range  Mood:  anxious, depressed, and irritable  Thought process:  normal  Thought content:    Obsessions and Rumination  Sensory/Perceptual disturbances:    Flashback  Orientation:  Oriented x4  Attention:  Good  Concentration:  Fair  Memory:  [place] of knowledge:   Good  Insight:    Good  Judgment:   Fair  Impulse Control:  Good   Risk Assessment: Danger to Self:  No Self-injurious Behavior: No Danger to Others: No Duty to Warn:no Physical Aggression / Violence:No  Access to Firearms a concern: No  Gang Involvement:No   Subjective: Client reported anxiety about her dad's health and her mom's denial, causing additional arguments with her mom. Therapist used MI & CBT with client to validate her frustration and stress and help her process her thoughts about her dad's physical decline and feelings about her mom not listening. Client recognizing the family drama and toxic relationships surrounding her that cause her distress. Client looking for a clean break from her sister in law who married both her brothers. Client feeling stressed and therapist used Mindfulness with client to help her regulate her anxious nervous system and to reduce her stress. Therapist assessed for safety and stability and client denied SI/HI/AVH.   Interventions: Cognitive Behavioral Therapy, Mindfulness Meditation, and Motivational Interviewing  Diagnosis:   ICD-10-CM   1. Major depressive disorder, recurrent episode, moderate (HCC)  F33.1     2. Obsessional thoughts  F42.8        Plan of Care  for emotion regulation:  Client to return for weekly therapy with therapist [name], for outpatient therapy and see medication provider for support of mood management.  Client to engage in CBT: challenging negative internal ruminations and self-talk AEB expressing toxic thoughts and challenging them with truth.  Client to practice DBT distress tolerance skills (such as distress tolerance and emotion regulation skills and achieving wise mind) to build support for dealing with emotional outbursts or internal emotional collapse AEB: using TIP, learning to ride the wave of emotion/sensations, staying in the present, and increasing their belief that they can do hard things.  Client to utilize BSP (brainspotting) with therapist to help client identify and process triggers for their emotional dysregulation, with goal of reducing said SUDs by 33% each session.  Client to ground their body if overwhelmed, flooded, or disassociating and not feeling safe using focused mindfulness, grounding, or meditation.  Client also to create more stability & structure AEB following goals to assist in helping stabilize their life domains, helping to calm their nervous system and to build healthy brain neuropathways. Client to prioritize self-care techniques and implement coping strategies to reduce the use of behaviors that incur consequences to themselves or others around them. Client is to practice self-compassion AEB being gentle with themselves, utilizing self-care techniques daily or as needed when grieving something in the moment.  Client is to process grief/pain of her loss of her ex-boyfriend in a somatic body-felt sense way: ie using mindfulness, brainspotting, or trauma release as a method for  releasing body pain/tension caused by grief.  [name], LCSW, LCAS, CCTP, CCS, BSP

## 2021-04-25 ENCOUNTER — Other Ambulatory Visit: Payer: Self-pay | Admitting: Psychiatry

## 2021-04-26 DIAGNOSIS — D472 Monoclonal gammopathy: Secondary | ICD-10-CM | POA: Diagnosis not present

## 2021-04-26 DIAGNOSIS — K5900 Constipation, unspecified: Secondary | ICD-10-CM | POA: Diagnosis not present

## 2021-04-26 DIAGNOSIS — F900 Attention-deficit hyperactivity disorder, predominantly inattentive type: Secondary | ICD-10-CM | POA: Diagnosis not present

## 2021-04-26 DIAGNOSIS — G43909 Migraine, unspecified, not intractable, without status migrainosus: Secondary | ICD-10-CM | POA: Diagnosis not present

## 2021-04-26 DIAGNOSIS — G629 Polyneuropathy, unspecified: Secondary | ICD-10-CM | POA: Diagnosis not present

## 2021-04-26 DIAGNOSIS — F411 Generalized anxiety disorder: Secondary | ICD-10-CM | POA: Diagnosis not present

## 2021-04-26 DIAGNOSIS — M797 Fibromyalgia: Secondary | ICD-10-CM | POA: Diagnosis not present

## 2021-04-27 ENCOUNTER — Telehealth: Payer: Self-pay | Admitting: Psychiatry

## 2021-04-27 NOTE — Telephone Encounter (Signed)
Noted I am working on this actually ? ?

## 2021-04-27 NOTE — Telephone Encounter (Signed)
Pt called asking status of Vyvanse PA. Pharmacy telling Pt denied and provider can send a letter and get it approved. I'm not to clear on all this. Contact Pt @ [phone]  ?

## 2021-04-27 NOTE — Telephone Encounter (Signed)
[place] [employer] with her updated diagnosis  ?

## 2021-04-28 NOTE — Telephone Encounter (Signed)
Prior Authorization for Vyvanse 30 mg  ?Approved 02/27/21 - 04/27/2022 ?[employer] non-formulary coverage. ?

## 2021-04-29 ENCOUNTER — Ambulatory Visit: Payer: Medicare Other | Admitting: Physical Therapy

## 2021-04-29 DIAGNOSIS — Z86010 Personal history of colonic polyps: Secondary | ICD-10-CM | POA: Diagnosis not present

## 2021-04-29 DIAGNOSIS — K582 Mixed irritable bowel syndrome: Secondary | ICD-10-CM | POA: Diagnosis not present

## 2021-04-29 DIAGNOSIS — K52832 Lymphocytic colitis: Secondary | ICD-10-CM | POA: Diagnosis not present

## 2021-05-04 ENCOUNTER — Other Ambulatory Visit: Payer: Self-pay

## 2021-05-04 ENCOUNTER — Ambulatory Visit: Payer: Medicare Other | Admitting: Physical Therapy

## 2021-05-04 ENCOUNTER — Encounter: Payer: Self-pay | Admitting: Physical Therapy

## 2021-05-04 DIAGNOSIS — M353 Polymyalgia rheumatica: Secondary | ICD-10-CM

## 2021-05-04 DIAGNOSIS — M6281 Muscle weakness (generalized): Secondary | ICD-10-CM | POA: Diagnosis not present

## 2021-05-04 DIAGNOSIS — R252 Cramp and spasm: Secondary | ICD-10-CM

## 2021-05-04 DIAGNOSIS — R293 Abnormal posture: Secondary | ICD-10-CM

## 2021-05-04 NOTE — Therapy (Signed)
Danielle ?[facility] ?[place], [place], [address] ?Phone: [phone]   Fax:  [phone] ? ?Physical Therapy Treatment ? ?Patient Details  ?Name: Danielle Bruce ?MRN: [id number] ?Date of Birth: 09-17-1960 ?Referring Provider (PT): Danielle Neer, MD ? ? ?Encounter Date: 05/04/2021 ? ? PT End of Session - 05/04/21 1059   ? ? Visit Number 38   ? Date for PT Re-Evaluation 05/05/21   ? Authorization Type Medicare/Medicaid- KX needed at 38 for 2023   ? Progress Note Due on Visit 40   ? PT Start Time 1100   ? PT Stop Time 1140   ? PT Time Calculation (min) 40 min   ? Activity Tolerance Patient tolerated treatment well   ? Behavior During Therapy Danielle Bruce for tasks assessed/performed   ? ?  ?  ? ?  ? ? ?Past Medical History:  ?Diagnosis Date  ? Anemia   ? Anxiety   ? Arthritis   ? osteoarthritis  ? ASCUS (atypical squamous cells of undetermined significance) on Pap smear 05/06/2005  ? NEG HIGH RISK HPV--C&B BIOPSY BENIGN 12/2005  ? Asthma   ? Bipolar 2 disorder ([place])   ? Cancer [facility])   ? skin cancer - basal cell  ? Colon polyps   ? Complication of anesthesia   ? anxious afterwards, will get headaches   ? Constipation   ? Depression   ? Fibromyalgia 10/2013  ? GERD (gastroesophageal reflux disease)   ? Hearing loss on left   ? Heart murmur   ? never had any problems  ? Hemorrhoids   ? High cholesterol   ? 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  ? cytology negative  ? IBS (irritable bowel syndrome)   ? Insomnia   ? Lymphocytic colitis   ? Lymphoma ([place])   ? 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 Bone marrow biopsy showes 8% plasma cells IgA Lambda  ? Migraines   ? Osteoarthritis   ? Osteopenia   ? Peripheral neuropathy   ? PTSD (post-traumatic stress disorder)   ? ? ?Past Surgical History:  ?Procedure Laterality Date  ? BONE MARROW BIOPSY Left 12/18/2013  ? 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 BREAST BIOPSY Right   ? benign stereo  ?  CESAREAN SECTION  [phone]  ? CHOLECYSTECTOMY N/A 10/16/2012  ? Procedure: LAPAROSCOPIC CHOLECYSTECTOMY;  Surgeon: Joyice Faster. Cornett, MD;  Location: [facility];  Service: General;  Laterality: N/A;  ? COLONOSCOPY    ? numerous times  ? DILATION AND CURETTAGE OF UTERUS    ? ESOPHAGOGASTRODUODENOSCOPY    ? [place]  ? x3  ? IUD REMOVAL  02/2015  ? Mirena  ? LAPAROSCOPIC LYSIS OF ADHESIONS N/A 10/16/2012  ? Procedure: LAPAROSCOPIC LYSIS OF ADHESIONS;  Surgeon: Joyice Faster. Cornett, MD;  Location: [facility];  Service: General;  Laterality: N/A;  ? LAPAROSCOPY N/A 10/16/2012  ? Procedure: LAPAROSCOPY DIAGNOSTIC;  Surgeon: Joyice Faster. Cornett, MD;  Location: [facility];  Service: General;  Laterality: N/A;  ? PELVIC LAPAROSCOPY    ? RADIOLOGY WITH ANESTHESIA N/A 12/16/2015  ? Procedure: MRI OF BRAIN WITH AND WITHOUT CONTRAST;  Surgeon: Medication Radiologist, MD;  Location: [facility];  Service: Radiology;  Laterality: N/A;  ? SHOULDER SURGERY  2007/2008  ? [place] SURGERY  2010  ? cervical  ? ? ?There were no vitals filed for this visit. ? ? Subjective Assessment - 05/04/21 1523   ? ?  Subjective Had GI flare up last week could not make my PT appt. I'm going to have Danielle Bruce look at my mid back I think I pinched a nerve. My Lt hip is ok.   ? Pertinent History goes by "Danielle Bruce" ; OA bilateral feet; fibromyalgia; off cycle of steriod medicine currenlty   ? Currently in Pain? No/denies   ? ?  ?  ? ?  ? ? ?Treatment: Pt arrives for aquatic physical therapy. Treatment took place in 3.5-5.5 feet of water. Water temperature was 93 degrees F. Pt entered the pool via stairs with mild use of rails. Pt requires buoyancy of water for support and to offload joints with strengthening exercises.  Pt utilizes viscosity of the water required for strengthening.  ? ?Standing in 50%- 75% depth water pt performed water walking in all 4 directions 10x. VC for speed in order to generate appropriate current for resistance and/or UE movements. Hand paddles  added for side stepping.  Yellow noodle used for forwards/backwards push/pull. ? ?Wall exercises to inc: Hip 3 ways 20x ea with min support of pool wall. VC to push/pull against water with a force appropriate. Core contractions in partial wall squat with neck pillow5 sec hold 10x. Bil heel raises 20x no UE support. Hold single buoy water weights underwater 30 sec 4x, then added high knee marching across the pool 4x. Attempted resisted trunk rotation with kickboard 10x. Pt reportsed feeling it more in her arms. Hand paddles for shoulder ext/flex 2x10, horizontal add/abd 2x 10, VC to maintain core throughout exercises.Underwater bicycle in horseback using large noodle for 10 min.    ? ? ? ? ? ? ? ? ? ? ? ? ? ? ? ? ? ? ? ? ? ? ? ? ? ? ? ? ? ? ? PT Long Term Goals - 04/14/21 1205   ? ?  ? PT LONG TERM GOAL #1  ? Title be independent in advanced HEP with completion 4-5x/wk for 5-10 each   ? Status On-going   ?  ? PT LONG TERM GOAL #3  ? Title report 75% less neck/UE pain throughout the day with normal functional activities.   ? Status On-going   ?  ? PT LONG TERM GOAL #4  ? Title initiate a gym exercise program and participate > or = to 1x/wk   ? Status On-going   ? ?  ?  ? ?  ? ? ? ? ? ? ? ? Plan - 05/04/21 1525   ? ? Clinical Impression Statement Pt missed her aquatic appt last week due to having a flare up of her GI issues. Today pt arrives to aquatic PT with no complaints but she does have this intermittent mid back burning she is concerned about. Pt continues to tolerate all aquatic exercises well and is considering joining [place] next week. Pt was able to hold the sible buoy weights underwater for 30 sec 4x making all the appropriate muscular connections.   ? Personal Factors and Comorbidities Comorbidity 2   ? Comorbidities OA bilat feet; fibromyalgia   ? Stability/Clinical Decision Making Evolving/Moderate complexity   ? Rehab Potential Excellent   ? PT Frequency 2x / week   ? PT Duration 8 weeks   ? PT  Treatment/Interventions ADLs/Self Care Home Management;Cryotherapy;Electrical Stimulation;Iontophoresis 14m/ml Dexamethasone;Moist Heat;Ultrasound;Neuromuscular re-education;Therapeutic exercise;Therapeutic activities;Patient/family education;Manual techniques;Dry needling;Passive range of motion;Taping;Aquatic Therapy   ? PT Next Visit Plan ERO next   ? PT Home Exercise Plan Access Code: [email or web address]  ? ?  ?  ? ?  ? ? ?  Patient will benefit from skilled therapeutic intervention in order to improve the following deficits and impairments:  Abnormal gait, Increased fascial restricitons, Difficulty walking, Decreased range of motion, Pain, Decreased strength, Postural dysfunction ? ?Visit Diagnosis: ?Polymyalgia rheumatica ([place]) ? ?Muscle weakness (generalized) ? ?Cramp and spasm ? ?Abnormal posture ? ? ? ? ?Problem List ?Patient Active Problem List  ? Diagnosis Date Noted  ? GAD (generalized anxiety disorder) 12/26/2017  ? OCD (obsessive compulsive disorder) 12/26/2017  ? PTSD (post-traumatic stress disorder) 12/26/2017  ? DDD (degenerative disc disease), cervical 07/14/2016  ? Primary osteoarthritis of both feet 07/14/2016  ? Primary osteoarthritis of both hands 07/14/2016  ? Other fatigue 07/14/2016  ? History of IBS 07/14/2016  ? Osteopenia of multiple sites 07/14/2016  ? Fibromyalgia 01/13/2016  ? MGUS (monoclonal gammopathy of unknown significance) 12/23/2013  ? Chronic cholecystitis without calculus 10/18/2012  ? Abdominal pain, unspecified site 10/18/2012  ? Nausea alone 10/18/2012  ? Unspecified constipation 10/18/2012  ? Depression 10/18/2012  ? Anxiety   ? ASCUS (atypical squamous cells of undetermined significance) on Pap smear   ? IUD   ? Hemorrhoids 11/29/2010  ? Abdominal pain, left upper quadrant 11/29/2010  ? ? ?Emery, PTA ?05/04/2021, 3:28 PM ? ?[facility] ?[facility] ?[place], [place], [address] ?Phone: [phone]   Fax:   [phone] ? ?Name: Danielle Bruce ?MRN: [id number] ?Date of Birth: 1960-10-29 ? ? ? ?

## 2021-05-05 ENCOUNTER — Ambulatory Visit: Payer: Medicare Other | Attending: Family Medicine

## 2021-05-05 DIAGNOSIS — R252 Cramp and spasm: Secondary | ICD-10-CM

## 2021-05-05 DIAGNOSIS — M353 Polymyalgia rheumatica: Secondary | ICD-10-CM | POA: Diagnosis not present

## 2021-05-05 DIAGNOSIS — R293 Abnormal posture: Secondary | ICD-10-CM | POA: Diagnosis not present

## 2021-05-05 DIAGNOSIS — M6281 Muscle weakness (generalized): Secondary | ICD-10-CM | POA: Insufficient documentation

## 2021-05-05 NOTE — Therapy (Signed)
OUTPATIENT PHYSICAL THERAPY TREATMENT   Patient Name: Danielle Bruce MRN: [id number] DOB:04/14/1960, 61 y.o., female Today's Date: 05/05/2021   PT End of Session - 05/05/21 1154     Visit Number 46    Date for PT Re-Evaluation 05/05/21    Authorization Type Medicare/Medicaid- KX needed at 47 for 2023    Progress Note Due on Visit 49    PT Start Time 1102    PT Stop Time 1146    PT Time Calculation (min) 44 min    Activity Tolerance Patient tolerated treatment well    Behavior During Therapy [facility] for tasks assessed/performed           Progress Note Reporting Period 03/04/21 to 05/05/21  See note below for Objective Data and Assessment of Progress/Goals.      Past Medical History:  Diagnosis Date   Anemia    Anxiety    Arthritis    osteoarthritis   ASCUS (atypical squamous cells of undetermined significance) on Pap smear 05/06/2005   NEG HIGH RISK HPV--C&B BIOPSY BENIGN 12/2005   Asthma    Bipolar 2 disorder ([facility])    Cancer ([place])    skin cancer - basal cell   Colon polyps    Complication of anesthesia    anxious afterwards, will get headaches    Constipation    Depression    Fibromyalgia 10/2013   GERD (gastroesophageal reflux disease)    Hearing loss on left    Heart murmur    never had any problems   Hemorrhoids    High cholesterol    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   cytology negative   IBS (irritable bowel syndrome)    Insomnia    Lymphocytic colitis    Lymphoma ([facility])    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   Bone marrow biopsy showes 8% plasma cells IgA Lambda   Migraines    Osteoarthritis    Osteopenia    Peripheral neuropathy    PTSD (post-traumatic stress disorder)    Past Surgical History:  Procedure Laterality Date   BONE MARROW BIOPSY Left 12/18/2013   Plasma cell dyscrasia 8% population of plasma cells   BREAST BIOPSY Right    benign stereo   CESAREAN SECTION  74,14,23   CHOLECYSTECTOMY N/A 10/16/2012   Procedure:  LAPAROSCOPIC CHOLECYSTECTOMY;  Surgeon: Joyice Faster. Cornett, MD;  [facility];  Service: General;  Laterality: N/A;   COLONOSCOPY     numerous times   DILATION AND CURETTAGE OF UTERUS     ESOPHAGOGASTRODUODENOSCOPY     HEMORRHOID SURGERY  1993   x3   IUD REMOVAL  02/2015   Mirena   LAPAROSCOPIC LYSIS OF ADHESIONS N/A 10/16/2012   Procedure: LAPAROSCOPIC LYSIS OF ADHESIONS;  Surgeon: Marcello Moores A. Cornett, MD;  [facility];  Service: General;  Laterality: N/A;   LAPAROSCOPY N/A 10/16/2012   Procedure: LAPAROSCOPY DIAGNOSTIC;  Surgeon: Joyice Faster. Cornett, MD;  [facility];  Service: General;  Laterality: N/A;   PELVIC LAPAROSCOPY     RADIOLOGY WITH ANESTHESIA N/A 12/16/2015   Procedure: MRI OF BRAIN WITH AND WITHOUT CONTRAST;  Surgeon: Medication Radiologist, MD;  Location: [facility];  Service: Radiology;  Laterality: N/A;   SHOULDER SURGERY  2007/2008   SPINE SURGERY  2010   cervical   Patient Active Problem List   Diagnosis Date Noted   GAD (generalized anxiety disorder) 12/26/2017   OCD (obsessive compulsive disorder) 12/26/2017   PTSD (post-traumatic stress disorder) 12/26/2017  DDD (degenerative disc disease), cervical 07/14/2016   Primary osteoarthritis of both feet 07/14/2016   Primary osteoarthritis of both hands 07/14/2016   Other fatigue 07/14/2016   History of IBS 07/14/2016   Osteopenia of multiple sites 07/14/2016   Fibromyalgia 01/13/2016   MGUS (monoclonal gammopathy of unknown significance) 12/23/2013   Chronic cholecystitis without calculus 10/18/2012   Abdominal pain, unspecified site 10/18/2012   Nausea alone 10/18/2012   Unspecified constipation 10/18/2012   Depression 10/18/2012   Anxiety    ASCUS (atypical squamous cells of undetermined significance) on Pap smear    IUD    Hemorrhoids 11/29/2010   Abdominal pain, left upper quadrant 11/29/2010    PCP: Mayra Neer, MD  REFERRING PROVIDER: Mayra Neer, MD  REFERRING DIAG: Polymyalgia  rheumatica  THERAPY DIAG:  Polymyalgia rheumatica ([place]) - Plan: PT plan of care cert/re-cert  Muscle weakness (generalized) - Plan: PT plan of care cert/re-cert  Cramp and spasm - Plan: PT plan of care cert/re-cert  Abnormal posture - Plan: PT plan of care cert/re-cert  ONSET DATE: 11/05/81 (chronic)  SUBJECTIVE:                                                                                                                                                                                           SUBJECTIVE STATEMENT: I love the pool.  I am able to exercise in there and nothing hurts.  I can tell that I worked hard but it doesn't feel like I am overdoing it.  I am have a GI flare-up and when I get past this,  I want to join the gym and swim and do exercises on my own as well.   PERTINENT HISTORY:  Anxiety, bipolar, depression, fibromyalgia, IBS, migraines  PAIN:  Are you having pain? Yes NPRS scale: 4/10 Pain location: neck, low back, hips  Pain orientation: Bilateral  PAIN TYPE: aching Pain description: intermittent, burning, and aching  Aggravating factors: overdoing things with grandkids, too much activiity Relieving factors: pool exercises,   PRECAUTIONS: Other: slow progression with exercise due to chronic condition  PATIENT GOALS be more active with less pain, exercise at the gym regularly   OBJECTIVE: 05/05/21   PATIENT SURVEYS:  NDI 42% limitation        POSTURE:  Forward head and rounded shoulders  PALPATION: Palpable tenderness and tension in neck and lumbar regions diffusely.   TODAY'S TREATMENT  05/05/21:  Manual:  Elongation to bil neck and upper traps, PA Mobs T6-10. PT advised pt to do thoracic mobs to address thoracic pain- cat/cow, foam roll stretch, child's pose.  Discussion regarding return to walking and regular  exercise at the gym. 05/04/21 (pool): Treatment: Pt arrives for aquatic physical therapy. Treatment took place in 3.5-5.5 feet of water.  Water temperature was 93 degrees F. Pt entered the pool via stairs with mild use of rails. Pt requires buoyancy of water for support and to offload joints with strengthening exercises.  Pt utilizes viscosity of the water required for strengthening.    Standing in 50%- 75% depth water pt performed water walking in all 4 directions 10x. VC for speed in order to generate appropriate current for resistance and/or UE movements. Hand paddles added for side stepping.  Yellow noodle used for forwards/backwards push/pull.   Wall exercises to inc: Hip 3 ways 20x ea with min support of pool wall. VC to push/pull against water with a force appropriate. Core contractions in partial wall squat with neck pillow5 sec hold 10x. Bil heel raises 20x no UE support. Hold single buoy water weights underwater 30 sec 4x, then added high knee marching across the pool 4x. Attempted resisted trunk rotation with kickboard 10x. Pt reportsed feeling it more in her arms. Hand paddles for shoulder ext/flex 2x10, horizontal add/abd 2x 10, VC to maintain core throughout exercises.Underwater bicycle in horseback using large noodle for 10 min.      HOME EXERCISE PROGRAM: Access Code: [email or web address]  ASSESSMENT:  CLINICAL IMPRESSION: Pt has been active with aquatic PT and is doing very well this week. Pt has had significant set-back in activity levels due to depression and flare-up of IBS. A good portion of the session was spent discussing goals and how to initiate new exercise including walking and gym exercises.  New goals set and pt agreed to all.  Pt    OBJECTIVE IMPAIRMENTS decreased endurance, decreased mobility, difficulty walking, decreased ROM, decreased strength, increased muscle spasms, improper body mechanics, postural dysfunction, and pain.   ACTIVITY LIMITATIONS cleaning, community activity, driving, meal prep, and yard work.   PERSONAL FACTORS Past/current experiences and 1-2 comorbidities: fibromyalgia, mental illness  are  also affecting patient's functional outcome.    GOALS: Goals reviewed with patient? Yes  SHORT TERM GOALS:  Initiate gym exercise program and report compliance 1x/wk Baseline: not doing this now Target date: 06/16/2021 Goal status: INITIAL  2.  Walk for exercise 3x/wk and verbalize safe progression Baseline: not walking now due to depression Target date: 06/16/2021 Goal status: INITIAL  LONG TERM GOALS:  be independent in advanced HEP with completion 4-5x/wk  Baseline: doing exercises only when things hurt Target date: 07/28/2021 Goal status: IN PROGRESS  2.  Pt will be able to walk at least 1 mile without increased limitation for ability to exercise and community function.  Baseline: has not been walking due to depression.  Not walking now.   Target date: 07/28/2021 Goal status: IN PROGRESS  3.  report 50% less neck/UE pain throughout the day with normal functional activities.  Baseline:  Target date: 07/28/2021 Goal status: INITIAL  4.  initiate a gym exercise program and participate > or = to 1x/wk  Baseline: going to the pool 1x for aquatics.  Will join gym soon  Target date: 07/28/2021 Goal status: IN PROGRESS  5.  Pt will demonstrate at least hip strength of 4+/5 for  improved stability for walking and lifting her grandchildren  Baseline:  Target date: 07/28/2021 Goal status: IN PROGRESS  6.  improve NDI to < or = to 30% disability  Baseline: 42 Target date: 07/28/2021 Goal status: IN PROGRESS  PLAN: PT FREQUENCY: 1-2x/week  PT DURATION: 12  weeks  PLANNED INTERVENTIONS: Therapeutic exercises, Therapeutic activity, Neuromuscular re-education, Balance training, Gait training, Patient/Family education, Joint mobilization, Aquatic Therapy, Dry Needling, Electrical stimulation, Cryotherapy, Moist heat, Taping, and Manual therapy  PLAN FOR NEXT SESSION: Continue aquatic therapy, pain management, land based PT for manual therapy as needed.   I did 10th visit 1 visit early,  next will be due on 26.   Kaisey Huseby, PT 05/05/2021, 11:57 AM

## 2021-05-05 NOTE — Telephone Encounter (Signed)
Her prior authorization is approved ?

## 2021-05-10 DIAGNOSIS — R0981 Nasal congestion: Secondary | ICD-10-CM | POA: Diagnosis not present

## 2021-05-11 ENCOUNTER — Ambulatory Visit: Payer: Medicare Other | Admitting: Physical Therapy

## 2021-05-17 DIAGNOSIS — S0502XA Injury of conjunctiva and corneal abrasion without foreign body, left eye, initial encounter: Secondary | ICD-10-CM | POA: Diagnosis not present

## 2021-05-17 NOTE — Therapy (Deleted)
?OUTPATIENT PHYSICAL THERAPY TREATMENT ? ? ?Patient Name: Danielle Bruce ?MRN: [id number] ?DOB:Nov 01, 1960, 61 y.o., female ?Today's Date: 05/17/2021 ? ? ?Progress Note ?Reporting Period 03/04/21 to 05/05/21 ? ?See note below for Objective Data and Assessment of Progress/Goals.  ? ?  ? ?Past Medical History:  ?Diagnosis Date  ? Anemia   ? Anxiety   ? Arthritis   ? osteoarthritis  ? ASCUS (atypical squamous cells of undetermined significance) on Pap smear 05/06/2005  ? NEG HIGH RISK HPV--C&B BIOPSY BENIGN 12/2005  ? Asthma   ? Bipolar 2 disorder ([place])   ? Cancer [facility])   ? skin cancer - basal cell  ? Colon polyps   ? Complication of anesthesia   ? anxious afterwards, will get headaches   ? Constipation   ? Depression   ? Fibromyalgia 10/2013  ? GERD (gastroesophageal reflux disease)   ? Hearing loss on left   ? Heart murmur   ? never had any problems  ? Hemorrhoids   ? High cholesterol   ? 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  ? cytology negative  ? IBS (irritable bowel syndrome)   ? Insomnia   ? Lymphocytic colitis   ? Lymphoma ([place])   ? 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 Bone marrow biopsy showes 8% plasma cells IgA Lambda  ? Migraines   ? Osteoarthritis   ? Osteopenia   ? Peripheral neuropathy   ? PTSD (post-traumatic stress disorder)   ? ?Past Surgical History:  ?Procedure Laterality Date  ? BONE MARROW BIOPSY Left 12/18/2013  ? 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 BREAST BIOPSY Right   ? benign stereo  ? CESAREAN SECTION  [phone]  ? CHOLECYSTECTOMY N/A 10/16/2012  ? Procedure: LAPAROSCOPIC CHOLECYSTECTOMY;  Surgeon: Joyice Faster. Cornett, MD;  Location: [facility];  Service: General;  Laterality: N/A;  ? COLONOSCOPY    ? numerous times  ? DILATION AND CURETTAGE OF UTERUS    ? ESOPHAGOGASTRODUODENOSCOPY    ? [place]  ? x3  ? IUD REMOVAL  02/2015  ? [place]  ? LAPAROSCOPIC LYSIS OF ADHESIONS N/A 10/16/2012  ? Procedure: LAPAROSCOPIC LYSIS OF ADHESIONS;  Surgeon: Joyice Faster.  Cornett, MD;  Location: [facility];  Service: General;  Laterality: N/A;  ? LAPAROSCOPY N/A 10/16/2012  ? Procedure: LAPAROSCOPY DIAGNOSTIC;  Surgeon: Joyice Faster. Cornett, MD;  Location: [facility];  Service: General;  Laterality: N/A;  ? PELVIC LAPAROSCOPY    ? RADIOLOGY WITH ANESTHESIA N/A 12/16/2015  ? Procedure: MRI OF BRAIN WITH AND WITHOUT CONTRAST;  Surgeon: Medication Radiologist, MD;  Location: [place];  Service: Radiology;  Laterality: N/A;  ? SHOULDER SURGERY  2007/2008  ? [place] SURGERY  2010  ? cervical  ? ?Patient Active Problem List  ? Diagnosis Date Noted  ? GAD (generalized anxiety disorder) 12/26/2017  ? OCD (obsessive compulsive disorder) 12/26/2017  ? PTSD (post-traumatic stress disorder) 12/26/2017  ? DDD (degenerative disc disease), cervical 07/14/2016  ? Primary osteoarthritis of both feet 07/14/2016  ? Primary osteoarthritis of both hands 07/14/2016  ? Other fatigue 07/14/2016  ? History of IBS 07/14/2016  ? Osteopenia of multiple sites 07/14/2016  ? Fibromyalgia 01/13/2016  ? MGUS (monoclonal gammopathy of unknown significance) 12/23/2013  ? Chronic cholecystitis without calculus 10/18/2012  ? Abdominal pain, unspecified site 10/18/2012  ? Nausea alone 10/18/2012  ? Unspecified constipation 10/18/2012  ? Depression 10/18/2012  ? Anxiety   ? ASCUS (atypical squamous cells of undetermined significance)  on Pap smear   ? IUD   ? Hemorrhoids 11/29/2010  ? Abdominal pain, left upper quadrant 11/29/2010  ? ? ?PCP: Mayra Neer, MD ? ?REFERRING PROVIDER: Mayra Neer, MD ? ?REFERRING DIAG: Polymyalgia rheumatica ? ?THERAPY DIAG:  ?Polymyalgia rheumatica ([place]) ? ?Muscle weakness (generalized) ? ?Cramp and spasm ? ?Abnormal posture ? ?ONSET DATE: 07/29/19 (chronic) ? ?SUBJECTIVE:                                                                                                                                                                                          ? ?SUBJECTIVE STATEMENT: ?I love the pool.  I am  able to exercise in there and nothing hurts.  I can tell that I worked hard but it doesn't feel like I am overdoing it.  I am have a GI flare-up and when I get past this,  I want to join the gym and swim and do exercises on my own as well.   ?PERTINENT HISTORY:  ?Anxiety, bipolar, depression, fibromyalgia, IBS, migraines ? ?PAIN:  ?Are you having pain? Yes ?NPRS scale: 4/10 ?Pain location: neck, low back, hips  ?Pain orientation: Bilateral  ?PAIN TYPE: aching ?Pain description: intermittent, burning, and aching  ?Aggravating factors: overdoing things with grandkids, too much activiity ?Relieving factors: pool exercises,  ? ?PRECAUTIONS: Other: slow progression with exercise due to chronic condition ? ?PATIENT GOALS be more active with less pain, exercise at the gym regularly ? ? ?OBJECTIVE: 05/05/21  ? ?PATIENT SURVEYS:  ?NDI 42% limitation  ? ?   ?  ?POSTURE:  ?Forward head and rounded shoulders ? ?PALPATION: ?Palpable tenderness and tension in neck and lumbar regions diffusely.  ? ?TODAY'S TREATMENT  ?05/18/21: ?Aquatic treatment: Pt arrives for aquatic physical therapy. Treatment took place in 3.5-5.5 feet of water. Water temperature was. 92 degrees F. Pt entered the pool via stairs with mild use of rails. Pt requires buoyancy of water for support and to offload joints with strengthening exercises.   ? ?Standing in 50%- 75% depth water pt performed water walking in all 4 directions 10x. VC for speed in order to generate appropriate current for resistance and/or UE movements. Hand paddles added for side stepping and yellow noodle for push/pull. ?05/05/21:  ?Manual:  ?Elongation to bil neck and upper traps, PA Mobs T6-10. ?PT advised pt to do thoracic mobs to address thoracic pain- cat/cow, foam roll stretch, child's pose.  ?Discussion regarding return to walking and regular exercise at the gym. ?05/04/21 (pool): ?Treatment: Pt arrives for aquatic physical therapy. Treatment took place in 3.5-5.5 feet of water. Water  temperature was 93 degrees F. Pt entered the  pool via stairs with mild use of rails. Pt requires buoyancy of water for support and to offload joints with strengthening exercises.  Pt utilizes viscosity of the water required for strengthening.  ?  ?Standing in 50%- 75% depth water pt performed water walking in all 4 directions 10x. VC for speed in order to generate appropriate current for resistance and/or UE movements. Hand paddles added for side stepping.  Yellow noodle used for forwards/backwards push/pull. ?  ?Wall exercises to inc: Hip 3 ways 20x ea with min support of pool wall. VC to push/pull against water with a force appropriate. Core contractions in partial wall squat with neck pillow5 sec hold 10x. Bil heel raises 20x no UE support. Hold single buoy water weights underwater 30 sec 4x, then added high knee marching across the pool 4x. Attempted resisted trunk rotation with kickboard 10x. Pt reportsed feeling it more in her arms. Hand paddles for shoulder ext/flex 2x10, horizontal add/abd 2x 10, VC to maintain core throughout exercises.Underwater bicycle in horseback using large noodle for 10 min.    ? ? ?HOME EXERCISE PROGRAM: ?Access Code: [email or web address] ? ?ASSESSMENT: ? ?CLINICAL IMPRESSION: ?Pt has been active with aquatic PT and is doing very well this week. Pt has had significant set-back in activity levels due to depression and flare-up of IBS. A good portion of the session was spent discussing goals and how to initiate new exercise including walking and gym exercises.  New goals set and pt agreed to all.  Pt  ? ? ?OBJECTIVE IMPAIRMENTS decreased endurance, decreased mobility, difficulty walking, decreased ROM, decreased strength, increased muscle spasms, improper body mechanics, postural dysfunction, and pain.  ? ?ACTIVITY LIMITATIONS cleaning, community activity, driving, meal prep, and yard work.  ? ?PERSONAL FACTORS Past/current experiences and 1-2 comorbidities: fibromyalgia, mental illness  are also  affecting patient's functional outcome.  ? ? ?GOALS: ?Goals reviewed with patient? Yes ? ?SHORT TERM GOALS: ? ?Initiate gym exercise program and report compliance 1x/wk ?Baseline: not doing this now ?[employer]

## 2021-05-18 ENCOUNTER — Ambulatory Visit: Payer: Medicare Other | Admitting: Physical Therapy

## 2021-05-18 DIAGNOSIS — M6281 Muscle weakness (generalized): Secondary | ICD-10-CM

## 2021-05-18 DIAGNOSIS — M353 Polymyalgia rheumatica: Secondary | ICD-10-CM

## 2021-05-18 DIAGNOSIS — R293 Abnormal posture: Secondary | ICD-10-CM

## 2021-05-18 DIAGNOSIS — R252 Cramp and spasm: Secondary | ICD-10-CM

## 2021-05-19 DIAGNOSIS — K5900 Constipation, unspecified: Secondary | ICD-10-CM | POA: Diagnosis not present

## 2021-05-19 DIAGNOSIS — J069 Acute upper respiratory infection, unspecified: Secondary | ICD-10-CM | POA: Diagnosis not present

## 2021-05-19 DIAGNOSIS — G43909 Migraine, unspecified, not intractable, without status migrainosus: Secondary | ICD-10-CM | POA: Diagnosis not present

## 2021-05-19 DIAGNOSIS — F411 Generalized anxiety disorder: Secondary | ICD-10-CM | POA: Diagnosis not present

## 2021-05-19 DIAGNOSIS — F900 Attention-deficit hyperactivity disorder, predominantly inattentive type: Secondary | ICD-10-CM | POA: Diagnosis not present

## 2021-05-19 DIAGNOSIS — M797 Fibromyalgia: Secondary | ICD-10-CM | POA: Diagnosis not present

## 2021-05-24 NOTE — Therapy (Addendum)
?OUTPATIENT PHYSICAL THERAPY TREATMENT ? ? ?Patient Name: Danielle Bruce ?MRN: [id number] ?DOB:June 01, 1960, 61 y.o., female ?Today's Date: 05/25/2021 ? ? PT End of Session - 05/25/21 1524   ? ? Visit Number 40   ? Date for PT Re-Evaluation 05/05/21   ? Authorization Type Medicare/Medicaid- KX needed at 42 for 2023   ? Progress Note Due on Visit 49   ? PT Start Time 1150   ? PT Stop Time 1230   ? PT Time Calculation (min) 40 min   ? Activity Tolerance Patient tolerated treatment well   ? Behavior During Therapy [facility] for tasks assessed/performed   ? ?  ?  ? ?  ? ? ?  ? ?Past Medical History:  ?Diagnosis Date  ? Anemia   ? Anxiety   ? Arthritis   ? osteoarthritis  ? ASCUS (atypical squamous cells of undetermined significance) on Pap smear 05/06/2005  ? NEG HIGH RISK HPV--C&B BIOPSY BENIGN 12/2005  ? Asthma   ? Bipolar 2 disorder ([place])   ? Cancer [facility])   ? skin cancer - basal cell  ? Colon polyps   ? Complication of anesthesia   ? anxious afterwards, will get headaches   ? Constipation   ? Depression   ? Fibromyalgia 10/2013  ? GERD (gastroesophageal reflux disease)   ? Hearing loss on left   ? Heart murmur   ? never had any problems  ? Hemorrhoids   ? High cholesterol   ? 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  ? cytology negative  ? IBS (irritable bowel syndrome)   ? Insomnia   ? Lymphocytic colitis   ? Lymphoma ([place])   ? 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 Bone marrow biopsy showes 8% plasma cells IgA Lambda  ? Migraines   ? Osteoarthritis   ? Osteopenia   ? Peripheral neuropathy   ? PTSD (post-traumatic stress disorder)   ? ?Past Surgical History:  ?Procedure Laterality Date  ? BONE MARROW BIOPSY Left 12/18/2013  ? 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 BREAST BIOPSY Right   ? benign stereo  ? CESAREAN SECTION  [phone]  ? CHOLECYSTECTOMY N/A 10/16/2012  ? Procedure: LAPAROSCOPIC CHOLECYSTECTOMY;  Surgeon: Joyice Faster. Cornett, MD;  [facility];  Service: General;  Laterality: N/A;   ? COLONOSCOPY    ? numerous times  ? DILATION AND CURETTAGE OF UTERUS    ? ESOPHAGOGASTRODUODENOSCOPY    ? [place]  ? x3  ? IUD REMOVAL  02/2015  ? Mirena  ? LAPAROSCOPIC LYSIS OF ADHESIONS N/A 10/16/2012  ? Procedure: LAPAROSCOPIC LYSIS OF ADHESIONS;  Surgeon: Joyice Faster. Cornett, MD;  [facility];  Service: General;  Laterality: N/A;  ? LAPAROSCOPY N/A 10/16/2012  ? Procedure: LAPAROSCOPY DIAGNOSTIC;  Surgeon: Joyice Faster. Cornett, MD;  [facility];  Service: General;  Laterality: N/A;  ? PELVIC LAPAROSCOPY    ? RADIOLOGY WITH ANESTHESIA N/A 12/16/2015  ? Procedure: MRI OF BRAIN WITH AND WITHOUT CONTRAST;  Surgeon: Medication Radiologist, MD;  Location: [place];  Service: Radiology;  Laterality: N/A;  ? SHOULDER SURGERY  2007/2008  ? [place] SURGERY  2010  ? cervical  ? ?Patient Active Problem List  ? Diagnosis Date Noted  ? GAD (generalized anxiety disorder) 12/26/2017  ? OCD (obsessive compulsive disorder) 12/26/2017  ? PTSD (post-traumatic stress disorder) 12/26/2017  ? DDD (degenerative disc disease), cervical 07/14/2016  ? Primary osteoarthritis of both feet 07/14/2016  ? Primary  osteoarthritis of both hands 07/14/2016  ? Other fatigue 07/14/2016  ? History of IBS 07/14/2016  ? Osteopenia of multiple sites 07/14/2016  ? Fibromyalgia 01/13/2016  ? MGUS (monoclonal gammopathy of unknown significance) 12/23/2013  ? Chronic cholecystitis without calculus 10/18/2012  ? Abdominal pain, unspecified site 10/18/2012  ? Nausea alone 10/18/2012  ? Unspecified constipation 10/18/2012  ? Depression 10/18/2012  ? Anxiety   ? ASCUS (atypical squamous cells of undetermined significance) on Pap smear   ? IUD   ? Hemorrhoids 11/29/2010  ? Abdominal pain, left upper quadrant 11/29/2010  ? ? ?PCP: Mayra Neer, MD ? ?REFERRING PROVIDER: Mayra Neer, MD ? ?REFERRING DIAG: Polymyalgia rheumatica ? ?THERAPY DIAG:  ?Polymyalgia rheumatica ([place]) ? ?Muscle weakness (generalized) ? ?Cramp and spasm ? ?Abnormal  posture ? ?ONSET DATE: 07/29/19 (chronic) ? ?SUBJECTIVE:                                                                                                                                                                                          ? ?SUBJECTIVE STATEMENT: ?I got a major migraine last Wednesday evening. It finally let up yesterday. ?   ?PERTINENT HISTORY:  ?Anxiety, bipolar, depression, fibromyalgia, IBS, migraines ? ?PAIN:  ?Are you having pain? No ?NPRS scale:0/10 ?Pain location:  ?Pain orientation:  ?PAIN TYPE: ?Pain description: Intermittent ?Aggravating factors: overdoing things with grandkids, too much activiity ?Relieving factors: pool exercises,  ? ?PRECAUTIONS: Other: slow progression with exercise due to chronic condition ? ?PATIENT GOALS be more active with less pain, exercise at the gym regularly ? ? ?OBJECTIVE: 05/05/21  ? ?PATIENT SURVEYS:  ?NDI 42% limitation  ? ?   ?  ?POSTURE:  ?Forward head and rounded shoulders ? ?PALPATION: ?Palpable tenderness and tension in neck and lumbar regions diffusely.  ? ?TODAY'S TREATMENT  ? ?05/25/21: ?Aquatic treatment: Pt arrives for aquatic physical therapy. Treatment took place in 3.5-5.5 feet of water. Water temperature was 90 degrees F. Pt entered the pool via stairs with mild use of rails. Pt requires buoyancy of water for support and to offload joints with strengthening exercises. Pt utilizes viscosity of the water required for strengthening.   ? ?Standing in 50%- 75% depth water pt performed water walking in all 4 directions 10x. VC for speed in order to generate appropriate current for resistance and/or UE movements. Hand paddles added for side stepping and yellow noodle for push/pull. ? ?Wall exercises to inc: Hip 3 ways 20x ea with min support of pool wall. VC to push/pull against water with a force appropriate. Core contractions in partial wall squat with blue noodle 5 sec hold 10x. Bil heel raises 20x  no UE support. High knee marching holding water  weights under water 4x. Trunk rotation with kickboard 10x.Hand paddles for shoulder ext/flex 2x10, horizontal add/abd 2x 10, VC to maintain core throughout exercises. Underwater bicycle in horseback using large noodle for 10 min. ?05/18/21: ?Aquatic treatment: Pt arrives for aquatic physical therapy. Treatment took place in 3.5-5.5 feet of water. Water temperature was. 92 degrees F. Pt entered the pool via stairs with mild use of rails. Pt requires buoyancy of water for support and to offload joints with strengthening exercises.   ? ?Standing in 50%- 75% depth water pt performed water walking in all 4 directions 10x. VC for speed in order to generate appropriate current for resistance and/or UE movements. Hand paddles added for side stepping and yellow noodle for push/pull. ?05/05/21:  ?Manual:  ?Elongation to bil neck and upper traps, PA Mobs T6-10. ?PT advised pt to do thoracic mobs to address thoracic pain- cat/cow, foam roll stretch, child's pose.  ?Discussion regarding return to walking and regular exercise at the gym. ?05/04/21 (pool): ?Treatment: Pt arrives for aquatic physical therapy. Treatment took place in 3.5-5.5 feet of water. Water temperature was 93 degrees F. Pt entered the pool via stairs with mild use of rails. Pt requires buoyancy of water for support and to offload joints with strengthening exercises.  Pt utilizes viscosity of the water required for strengthening.  ?  ?Standing in 50%- 75% depth water pt performed water walking in all 4 directions 10x. VC for speed in order to generate appropriate current for resistance and/or UE movements. Hand paddles added for side stepping.  Yellow noodle used for forwards/backwards push/pull. ?  ?Wall exercises to inc: Hip 3 ways 20x ea with min support of pool wall. VC to push/pull against water with a force appropriate. Core contractions in partial wall squat with neck pillow5 sec hold 10x. Bil heel raises 20x no UE support. Hold single buoy water weights  underwater 30 sec 4x, then added high knee marching across the pool 4x. Attempted resisted trunk rotation with kickboard 10x. Pt reportsed feeling it more in her arms. Hand paddles for shoulder ext/flex 2x10,

## 2021-05-25 ENCOUNTER — Encounter: Payer: Self-pay | Admitting: Physical Therapy

## 2021-05-25 ENCOUNTER — Ambulatory Visit: Payer: Medicare Other | Admitting: Physical Therapy

## 2021-05-25 DIAGNOSIS — R293 Abnormal posture: Secondary | ICD-10-CM | POA: Diagnosis not present

## 2021-05-25 DIAGNOSIS — M353 Polymyalgia rheumatica: Secondary | ICD-10-CM

## 2021-05-25 DIAGNOSIS — R252 Cramp and spasm: Secondary | ICD-10-CM

## 2021-05-25 DIAGNOSIS — M6281 Muscle weakness (generalized): Secondary | ICD-10-CM

## 2021-05-26 DIAGNOSIS — G43909 Migraine, unspecified, not intractable, without status migrainosus: Secondary | ICD-10-CM | POA: Diagnosis not present

## 2021-05-31 NOTE — Therapy (Addendum)
?OUTPATIENT PHYSICAL THERAPY TREATMENT ? ? ?Patient Name: Danielle Bruce ?MRN: [id number] ?DOB:06-Dec-1960, 61 y.o., female ?Today's Date: 06/01/2021 ? ? PT End of Session - 06/01/21 1753   ? ? Date for PT Re-Evaluation 05/05/21   ? ?  ?  ? ?  ? ? ? ? ?  ? ?Past Medical History:  ?Diagnosis Date  ? Anemia   ? Anxiety   ? Arthritis   ? osteoarthritis  ? ASCUS (atypical squamous cells of undetermined significance) on Pap smear 05/06/2005  ? NEG HIGH RISK HPV--C&B BIOPSY BENIGN 12/2005  ? Asthma   ? Bipolar 2 disorder ([place])   ? Cancer [facility])   ? skin cancer - basal cell  ? Colon polyps   ? Complication of anesthesia   ? anxious afterwards, will get headaches   ? Constipation   ? Depression   ? Fibromyalgia 10/2013  ? GERD (gastroesophageal reflux disease)   ? Hearing loss on left   ? Heart murmur   ? never had any problems  ? Hemorrhoids   ? High cholesterol   ? 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  ? cytology negative  ? IBS (irritable bowel syndrome)   ? Insomnia   ? Lymphocytic colitis   ? Lymphoma ([place])   ? 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 Bone marrow biopsy showes 8% plasma cells IgA Lambda  ? Migraines   ? Osteoarthritis   ? Osteopenia   ? Peripheral neuropathy   ? PTSD (post-traumatic stress disorder)   ? ?Past Surgical History:  ?Procedure Laterality Date  ? BONE MARROW BIOPSY Left 12/18/2013  ? 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 BREAST BIOPSY Right   ? benign stereo  ? CESAREAN SECTION  [phone]  ? CHOLECYSTECTOMY N/A 10/16/2012  ? Procedure: LAPAROSCOPIC CHOLECYSTECTOMY;  Surgeon: Joyice Faster. Cornett, MD;  Location: [facility];  Service: General;  Laterality: N/A;  ? COLONOSCOPY    ? numerous times  ? DILATION AND CURETTAGE OF UTERUS    ? ESOPHAGOGASTRODUODENOSCOPY    ? [place]  ? x3  ? IUD REMOVAL  02/2015  ? [place]  ? LAPAROSCOPIC LYSIS OF ADHESIONS N/A 10/16/2012  ? Procedure: LAPAROSCOPIC LYSIS OF ADHESIONS;  Surgeon: Joyice Faster. Cornett, MD;  Location: [facility];  Service: General;  Laterality: N/A;  ? LAPAROSCOPY N/A 10/16/2012  ? Procedure: LAPAROSCOPY DIAGNOSTIC;  Surgeon: Joyice Faster. Cornett, MD;  Location: [facility];  Service: General;  Laterality: N/A;  ? PELVIC LAPAROSCOPY    ? RADIOLOGY WITH ANESTHESIA N/A 12/16/2015  ? Procedure: MRI OF BRAIN WITH AND WITHOUT CONTRAST;  Surgeon: Medication Radiologist, MD;  Location: [place];  Service: Radiology;  Laterality: N/A;  ? SHOULDER SURGERY  2007/2008  ? [place] SURGERY  2010  ? cervical  ? ?Patient Active Problem List  ? Diagnosis Date Noted  ? GAD (generalized anxiety disorder) 12/26/2017  ? OCD (obsessive compulsive disorder) 12/26/2017  ? PTSD (post-traumatic stress disorder) 12/26/2017  ? DDD (degenerative disc disease), cervical 07/14/2016  ? Primary osteoarthritis of both feet 07/14/2016  ? Primary osteoarthritis of both hands 07/14/2016  ? Other fatigue 07/14/2016  ? History of IBS 07/14/2016  ? Osteopenia of multiple sites 07/14/2016  ? Fibromyalgia 01/13/2016  ? MGUS (monoclonal gammopathy of unknown significance) 12/23/2013  ? Chronic cholecystitis without calculus 10/18/2012  ? Abdominal pain, unspecified site 10/18/2012  ? Nausea alone 10/18/2012  ? Unspecified constipation 10/18/2012  ? Depression 10/18/2012  ? Anxiety   ?  ASCUS (atypical squamous cells of undetermined significance) on Pap smear   ? IUD   ? Hemorrhoids 11/29/2010  ? Abdominal pain, left upper quadrant 11/29/2010  ? ? ?PCP: Mayra Neer, MD ? ?REFERRING PROVIDER: Mayra Neer, MD ? ?REFERRING DIAG: Polymyalgia rheumatica ? ?THERAPY DIAG:  ?Polymyalgia rheumatica ([place]) ? ?Muscle weakness (generalized) ? ?Cramp and spasm ? ?Abnormal posture ? ?ONSET DATE: 07/29/19 (chronic) ? ?SUBJECTIVE:                                                                                                                                                                                          ? ?SUBJECTIVE STATEMENT: ?Slight headache today.   ?PERTINENT HISTORY:   ?Anxiety, bipolar, depression, fibromyalgia, IBS, migraines ? ?PAIN:  ?Are you having pain? Slight headache ?NPRS scale:0/10 ?Pain location:  ?Pain orientation:  ?PAIN TYPE: ?Pain description: Intermittent ?Aggravating factors: overdoing things with grandkids, too much activiity ?Relieving factors: pool exercises,  ? ?PRECAUTIONS: Other: slow progression with exercise due to chronic condition ? ?PATIENT GOALS be more active with less pain, exercise at the gym regularly ? ? ?OBJECTIVE: 05/05/21  ? ?PATIENT SURVEYS:  ?NDI 42% limitation  ? ?   ?  ?POSTURE:  ?Forward head and rounded shoulders ? ?PALPATION: ?Palpable tenderness and tension in neck and lumbar regions diffusely.  ? ?TODAY'S TREATMENT  ?06/01/21: Aquatic treatment: Pt arrives for aquatic physical therapy. Treatment took place in 3.5-5.5 feet of water. Water temperature was 92 degrees F. Pt entered the pool via stairs with mild use of rails. Pt requires buoyancy of water for support and to offload joints with strengthening exercises. Pt utilizes viscosity of the water required for strengthening.   ?Standing in 50%- 75% depth water pt performed water walking in all 4 directions 10x. VC for speed in order to generate appropriate current for resistance and/or UE movements. Hand paddles added for side stepping and yellow noodle for push/pull. ?Wall exercises to inc: Hip 3 ways 20x ea with min support of pool wall. VC to push/pull against water with a force appropriate. Core contractions in partial wall squat with blue noodle 5 sec hold 10x. Bil heel raises 20x no UE support. High knee marching holding water weights under water 4x. Trunk rotation with kickboard 20x.Hand paddles for shoulder ext/flex 2x10, horizontal add/abd 2x 10, VC to maintain core throughout exercises. Underwater bicycle in horseback using large noodle for 10 min. Blue noodle single leg press 10x Bil.  ? ?05/25/21: ?Aquatic treatment: Pt arrives for aquatic physical therapy. Treatment took place  in 3.5-5.5 feet of water. Water temperature was 90 degrees F. Pt entered the pool via stairs with mild  use of rails. Pt requires buoyancy of water for support and to offload joints with strengthening exercises. Pt utilizes viscosity of the water required for strengthening.   ? ?Standing in 50%- 75% depth water pt performed water walking in all 4 directions 10x. VC for speed in order to generate appropriate current for resistance and/or UE movements. Hand paddles added for side stepping and yellow noodle for push/pull. ? ?Wall exercises to inc: Hip 3 ways 20x ea with min support of pool wall. VC to push/pull against water with a force appropriate. Core contractions in partial wall squat with blue noodle 5 sec hold 10x. Bil heel raises 20x no UE support. High knee marching holding water weights under water 4x. Trunk rotation with kickboard 10x.Hand paddles for shoulder ext/flex 2x10, horizontal add/abd 2x 10, VC to maintain core throughout exercises. Underwater bicycle in horseback using large noodle for 10 min. ?05/18/21: ?Aquatic treatment: Pt arrives for aquatic physical therapy. Treatment took place in 3.5-5.5 feet of water. Water temperature was. 92 degrees F. Pt entered the pool via stairs with mild use of rails. Pt requires buoyancy of water for support and to offload joints with strengthening exercises.   ? ?Standing in 50%- 75% depth water pt performed water walking in all 4 directions 10x. VC for speed in order to generate appropriate current for resistance and/or UE movements. Hand paddles added for side stepping and yellow noodle for push/pull. ?05/05/21:  ?Manual:  ?Elongation to bil neck and upper traps, PA Mobs T6-10. ?PT advised pt to do thoracic mobs to address thoracic pain- cat/cow, foam roll stretch, child's pose.  ?Discussion regarding return to walking and regular exercise at the gym. ?05/04/21 (pool): ?Treatment: Pt arrives for aquatic physical therapy. Treatment took place in 3.5-5.5 feet of water.  Water temperature was 93 degrees F. Pt entered the pool via stairs with mild use of rails. Pt requires buoyancy of water for support and to offload joints with strengthening exercises.  Pt utilizes viscosity

## 2021-06-01 ENCOUNTER — Encounter: Payer: Self-pay | Admitting: Physical Therapy

## 2021-06-01 ENCOUNTER — Ambulatory Visit: Payer: Medicare Other | Attending: Family Medicine | Admitting: Physical Therapy

## 2021-06-01 DIAGNOSIS — M353 Polymyalgia rheumatica: Secondary | ICD-10-CM | POA: Diagnosis not present

## 2021-06-01 DIAGNOSIS — M6281 Muscle weakness (generalized): Secondary | ICD-10-CM | POA: Insufficient documentation

## 2021-06-01 DIAGNOSIS — R293 Abnormal posture: Secondary | ICD-10-CM | POA: Insufficient documentation

## 2021-06-01 DIAGNOSIS — R252 Cramp and spasm: Secondary | ICD-10-CM | POA: Diagnosis not present

## 2021-06-06 ENCOUNTER — Encounter: Payer: Self-pay | Admitting: Psychiatry

## 2021-06-06 ENCOUNTER — Ambulatory Visit (INDEPENDENT_AMBULATORY_CARE_PROVIDER_SITE_OTHER): Payer: Medicare Other | Admitting: Psychiatry

## 2021-06-06 DIAGNOSIS — F331 Major depressive disorder, recurrent, moderate: Secondary | ICD-10-CM

## 2021-06-06 DIAGNOSIS — F5105 Insomnia due to other mental disorder: Secondary | ICD-10-CM | POA: Diagnosis not present

## 2021-06-06 DIAGNOSIS — F5081 Binge eating disorder: Secondary | ICD-10-CM

## 2021-06-06 DIAGNOSIS — F50811 Binge eating disorder, moderate: Secondary | ICD-10-CM

## 2021-06-06 DIAGNOSIS — F902 Attention-deficit hyperactivity disorder, combined type: Secondary | ICD-10-CM | POA: Diagnosis not present

## 2021-06-06 DIAGNOSIS — F411 Generalized anxiety disorder: Secondary | ICD-10-CM

## 2021-06-06 DIAGNOSIS — F428 Other obsessive-compulsive disorder: Secondary | ICD-10-CM | POA: Diagnosis not present

## 2021-06-06 DIAGNOSIS — F4310 Post-traumatic stress disorder, unspecified: Secondary | ICD-10-CM

## 2021-06-06 DIAGNOSIS — F4001 Agoraphobia with panic disorder: Secondary | ICD-10-CM

## 2021-06-06 DIAGNOSIS — F338 Other recurrent depressive disorders: Secondary | ICD-10-CM | POA: Diagnosis not present

## 2021-06-06 MED ORDER — LISDEXAMFETAMINE DIMESYLATE 40 MG PO CAPS: 40.0000 mg | ORAL_CAPSULE | Freq: Every day | ORAL | 0 refills | Status: DC

## 2021-06-06 NOTE — Progress Notes (Signed)
Danielle Bruce ?[id number] ?1960/05/18 ?61 y.o. ? ? ?Subjective:  ? ?Patient ID:  Danielle Bruce is a 61 y.o. (DOB 03/10/1960) female. ? ?Chief Complaint:  ?Chief Complaint  ?Patient presents with  ? Follow-up  ? Depression  ? Anxiety  ? ADD  ? Sleeping Problem  ? ? ?HPI ?Danielle Bruce presents for follow-up of multiple dxes. ? ?visit in April and taken off lamotrigine DT possible skin reaction.  Had appt with [facility] dermatology and they assume that skin reaction with itching was related to lamotrigine. 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 Per oncology most likely drug is lamotrigine. And she weaned off of the medication to determine if the cutaneous lesions are related. Lesions seem less without lamotrigine.  At this point skin reaction is presumed related to lamotrigine.  No other disease sx. ?She is well aware that she has been on lamotrigine for many years with good results for depression control. There is a risk of relapse as she comes off of the medication and she is worried about that. ?Smart phone helps her cognitive  And other productivity.   ? ?seen October 2020.  Restarted low dosage ADD bc concerns over STM, concentration and productivity.  Ritalin 5 mg twice daily.  Also suggested she retry N-acetylcysteine for mild cognitive complaints. ? ? seen April 07, 2019 the following was noted: ?Mostly good.  Anxiety is mostly better, but depression is a little worse seasonally.  Not able to go help her daughter.   Low energy but does have motivation.  Ritalin helps energy and focus but doesn't take it regularly bc irregular sleep cycle.   ?Ativan only when having medical issues.    ?Therapy working on trauma issues is very hard.  Struggling with the fact she doesn't have memory of large parts of her past.   ?No manic sx off mood stabilizers.  Covid isolation reduce stressors  overall and not prominently depressed.   ?No meds were changed. ? ?June 25, 2019 appointment the following is noted: ?F real worried about Covid despite vaccination.   ?Started fluvoxamine 25 on May 14, 2019 from PCP mainly for inflammation but possibly thinking she might be depressed. ?Seen benefit for energy and sleep pattern and more appropriate feelings and less numb.  Now realizes she had been depressed for awhile but just numb with depression.  Anniversaries of deaths of friends including last BF Danielle Bruce a little over a year ago.   Thinks of him daily. His 59 yo D died of unknown cause recently.  Stress D [place] to beach. ?Wonders about increasing the fluvoxamine  ?Danielle Bruce living in [place] and hasn't seen him in a year. ?Danielle Bruce on disability for depression. ?Patient reports more depression.  ?Plan: Realized lately she's depressed and started Luvox again for it and for the anti-inflammatory potential for her health.  Has seen some benefit.   Prior benefit at 25 mg daily.  Optiion increase but slightly bc med sensitive. ?AGREE increase fluvoxamine to 37.5 mg daily. ? ?08/06/2019 appointment with the following noted: ?Increased fluvoxamine to 37.5 mg and it helped the depression. ?Could see a big difference and had been more depressed than she even realized. ?Outlook better and sleep pattern normalized.    More energy and motivation.   ?But also started having prolonged HA.  HA lasted a couple of weeks.   ?Assumed HA was DT increased fluvoxamine so reduced  to 25 mg on 07/28/19.  ?07/29/19 Episode of confusion occurred also.  Went to doctor while confused.  They couldn't determine cause.    ?Resolved in 1 day. ?FM flare up since fall/winter. ?To beach with daughter 3 mos minimum to give her shots for IVF treatment. ?Patient reports panic recently and recent difficulty with anxiety.    Occ flashbacks.   Patient denies difficulty with sleep initiation or maintenance. Denies appetite disturbance.  Patient reports that energy  and motivation have been good.  Patient denies any difficulty with concentration.  Patient denies any suicidal ideation. ?Plan: AGREE increase fluvoxamine to 37.5 mg daily once HA is controlled for several weekcs. ? ?01/01/20 appt with the following noted: ?Increased fluvoxamine for awhile but got more HA and reduced it. ?Anxiety is better in general now.  But is interested in increasing it DT coming winter. ?When anxiety is not good it catches her off guard. ?Likely had Covid in August but early test was negative.  Got prednisone but sick for 5 weeks.  Lost taste and smell still going on.  D also had Covid.  Felt really sick and terrified with bad mental health at the time.  Was traumatic for her.  Had been vaccinated.   ?Had panic attack at the ER and took a long time to get the Ativan.  ?D moving back to area and pt will have to help with childcare so hopes to more aggressively treat her health problems. ?2 gkids and 2 more coming. ?Danielle Bruce married without kids yet. ?Plan: started Luvox again for it and for the anti-inflammatory potential for her health.  Has seen some benefit.   Prior benefit at 25 mg daily.  Optiion increase but slightly bc med sensitive. ?AGREE increase fluvoxamine to 37.5 mg daily once HA is controlled for several weeks. ? ?03/03/2020 appointment with the following noted: ?On Day 10 of Covid.  2nd bout. A lot easier this time. ?Had gotten Luvox up to 50 mg daily and tolerated.  Then doubled it to 100 mg daily. ?Wonders about the proper dose for maintenance.   ?No taste and smell, exhausted, congestion and mild cough.  Energy is better than it was in the beginning.  Made her depressed and anxious again and that part is better also.  ?Last time had to go to ER with Covid.  Only ER visit in 2-3 years.  Less migraine than in the past. ?Overall was doing better with last couple months.  Trying to do anti-inflammatory diet.  Vegetarian, no dairy or gluten.  Natural sugars.   ?Still some anxiety. ?D  moved back here and pregnant with 2nd set of twins. Current twins are 59 yo.   ?Will restart trauma therapy and it's helped over the last couple of years.  More freedom from things.      ?Less daily dread with anxiety but still some panic which can be triggered.  Less OCD with fluvoxamine unless triggered.   ?Taking Ritalin 10 mg each AM and tolerating and benefiting. ?occ NM with few dreams overall. ?Plan: started Luvox again for it and for the anti-inflammatory potential for her health.  Has seen some benefit.   Prior benefit at 25 mg daily.  Med sensitive. ?AGREE increase fluvoxamine to  50  mg daily. Up to 100 mg daily if desired. ?  ?04/22/19 appt with following noted: ?Mostly good and randomly psycho.  Really busy with Colletta [place] [redacted] weeks pregnant with twins and twins 59 yo at home.  She's helping  and her H is away. ?Mo older and slower.  Pt overworking.  When with Colletta [place] kids interfere with sleep.  Doesn't eat as well with Colletta [place] affects her FM and energy.   ?Recently exhausted and needed rest but couldn't go home DT D had to go to hospital.  Her H critical at times and she lost it emotionally over this. Almost suicidal reaction with weird intrusive thoughts in response to the criticism.   Got a break and it helped.   ?Rheum adjusted Lyrica to BID. ?Had accident fall with one of babies and was bleeding.  She became hysterical.   Upset now that she couldn't control her emotions at the time.  Couldn't get herself under control to deal with the crisis.    ?Overall doing ok but doesn't handle stressors well and lose it.   ?Plan: AGREE with increase fluvoxamine to  50  mg daily. Up to 100 mg daily if desired. ? ?06/23/2020 appointment with the following noted: ?Increased fluvoxamine 25 AM and 50 mg PM and tolerated it.  Been on this dose awhile.  ?Going to [place] May 6-19 and son taking her.  Working for [employer] and been successful. ?Also going to [place], [place].  Excited.   ?Occ overwhelmed. ?Starting therapy  with Kayla and looking forward to it and dealing with trauma stuff. ?Nervous about doing the therapy and getting decompensated like what happened last time. ?Prednisone hellps mood and body. ?Not as depressed

## 2021-06-08 ENCOUNTER — Ambulatory Visit (INDEPENDENT_AMBULATORY_CARE_PROVIDER_SITE_OTHER): Payer: Medicare Other | Admitting: Addiction (Substance Use Disorder)

## 2021-06-08 DIAGNOSIS — F331 Major depressive disorder, recurrent, moderate: Secondary | ICD-10-CM | POA: Diagnosis not present

## 2021-06-08 DIAGNOSIS — F428 Other obsessive-compulsive disorder: Secondary | ICD-10-CM | POA: Diagnosis not present

## 2021-06-08 NOTE — Progress Notes (Signed)
?    [facility] Counselor/Therapist Progress Note ? ?Patient ID: Danielle Bruce, MRN: [id number],   ? ?Date: 06/08/2021 ? ?Time Spent: 60mns ? ?Treatment Type: Individual Therapy ? ?Reported Symptoms: feeling more positive about self  ? ?Mental Status Exam:  ? ?Appearance:   Casual and Well Groomed     ?Behavior:  Sharing  ?Motor:  Normal  ?Speech/Language:   Clear and Coherent and Normal Rate  ?Affect:  Appropriate and Full Range  ?Mood:  normal  ?Thought process:  normal  ?Thought content:    Obsessions and Rumination  ?Sensory/Perceptual disturbances:    Flashback  ?Orientation:  Oriented x4  ?Attention:  Good  ?Concentration:  Fair  ?Memory:  Fair  ?Fund of knowledge:   Good  ?Insight:    Good  ?Judgment:   Fair  ?Impulse Control:  Good  ? ?Risk Assessment: ?Danger to Self:  No ?Self-injurious Behavior: No ?Danger to Others: No ?Duty to Warn:no ?Physical Aggression / Violence:No  ?Access to Firearms a concern: No  ?Gang Involvement:No  ? ?Subjective: Client reported feeling better about heraself, not feeling depressed or as much self esteem issues, esp since being able to cub binge eating since starting a new medication. Client processed how her MFlorencehas changed over this year and the last few years and therapist used MI to affirm client's progress and encourage continued growth with motivation. Therapist also used goal setting to discuss client's goals for herself and esp her anxiety. Client still struggling with obsessions that come from her anxious thoughts. Therapist used CBT with client to help her challenge those thoughts and normalize her concerns about certain issues. Therapist assessed for safety and stability and client denied SI/HI/AVH.  ? ?Interventions: Cognitive Behavioral Therapy and Motivational Interviewing ? ?Diagnosis: ?No diagnosis found. ? ? ? ?Plan of Care for emotion regulation:  ?Client to return for weekly therapy with therapist KSammuel Cooper for outpatient therapy and see medication  provider for support of mood management.  ?Client to engage in CBT: challenging negative internal ruminations and self-talk AEB expressing toxic thoughts and challenging them with truth.  ?Client to practice DBT distress tolerance skills (such as distress tolerance and emotion regulation skills and achieving wise mind) to build support for dealing with emotional outbursts or internal emotional collapse AEB: using TIP, learning to ride the wave of emotion/sensations, staying in the present, and increasing their belief that they can do hard things.  ?Client to utilize BSP (brainspotting) with therapist to help client identify and process triggers for their emotional dysregulation, with goal of reducing said SUDs by 33% each session.  ?Client to ground their body if overwhelmed, flooded, or disassociating and not feeling safe using focused mindfulness, grounding, or meditation.  ?Client also to create more stability & structure AEB following goals to assist in helping stabilize their life domains, helping to calm their nervous system and to build healthy brain neuropathways. ?Client to prioritize self-care techniques and implement coping strategies to reduce the use of behaviors that incur consequences to themselves or others around them. ?Client is to practice self-compassion AEB being gentle with themselves, utilizing self-care techniques daily or as needed when grieving something in the moment.  ?Client is to process grief/pain of her loss of her ex-boyfriend in a somatic body-felt sense way: ie using mindfulness, brainspotting, or trauma release as a method for releasing body pain/tension caused by grief. ? ?KBarnie Del LCSW, LCAS, CCTP, CCS, BSP ? ? ? ? ? ? ? ? ? ? ? ? ? ? ? ? ? ? ?

## 2021-06-15 ENCOUNTER — Ambulatory Visit: Payer: Medicare Other | Admitting: Addiction (Substance Use Disorder)

## 2021-06-15 ENCOUNTER — Ambulatory Visit: Payer: Medicare Other

## 2021-06-15 DIAGNOSIS — R252 Cramp and spasm: Secondary | ICD-10-CM | POA: Diagnosis not present

## 2021-06-15 DIAGNOSIS — M353 Polymyalgia rheumatica: Secondary | ICD-10-CM

## 2021-06-15 DIAGNOSIS — R293 Abnormal posture: Secondary | ICD-10-CM | POA: Diagnosis not present

## 2021-06-15 DIAGNOSIS — M6281 Muscle weakness (generalized): Secondary | ICD-10-CM | POA: Diagnosis not present

## 2021-06-15 NOTE — Therapy (Signed)
?OUTPATIENT PHYSICAL THERAPY TREATMENT ? ? ?Patient Name: DELESA KAWA ?MRN: [id number] ?DOB:[birth year], 61 y.o., female ?Today's Date: 06/15/2021 ? ? PT End of Session - 06/15/21 1311   ? ? Visit Number 42   ? Date for PT Re-Evaluation 07/28/21   ? Authorization Type Medicare/Medicaid- KX needed at 67 for 2023   ? Progress Note Due on Visit 49   ? PT Start Time 1233   ? PT Stop Time 1308   ? PT Time Calculation (min) 35 min   ? Activity Tolerance Patient tolerated treatment well   ? Behavior During Therapy [facility] for tasks assessed/performed   ? ?  ?  ? ?  ? ? ? ? ? ?  ? ?Past Medical History:  ?Diagnosis Date  ? Anemia   ? Anxiety   ? Arthritis   ? osteoarthritis  ? ASCUS (atypical squamous cells of undetermined significance) on Pap smear 05/06/2005  ? NEG HIGH RISK HPV--C&B BIOPSY BENIGN 12/2005  ? Asthma   ? Bipolar 2 disorder ([place])   ? Cancer [facility])   ? skin cancer - basal cell  ? Colon polyps   ? Complication of anesthesia   ? anxious afterwards, will get headaches   ? Constipation   ? Depression   ? Fibromyalgia 10/2013  ? GERD (gastroesophageal reflux disease)   ? Hearing loss on left   ? Heart murmur   ? never had any problems  ? Hemorrhoids   ? High cholesterol   ? 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  ? cytology negative  ? IBS (irritable bowel syndrome)   ? Insomnia   ? Lymphocytic colitis   ? Lymphoma ([place])   ? 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 Bone marrow biopsy showes 8% plasma cells IgA Lambda  ? Migraines   ? Osteoarthritis   ? Osteopenia   ? Peripheral neuropathy   ? PTSD (post-traumatic stress disorder)   ? ?Past Surgical History:  ?Procedure Laterality Date  ? BONE MARROW BIOPSY Left 12/18/2013  ? 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 BREAST BIOPSY Right   ? benign stereo  ? CESAREAN SECTION  [phone]  ? CHOLECYSTECTOMY N/A 10/16/2012  ? Procedure: LAPAROSCOPIC CHOLECYSTECTOMY;  Surgeon: Joyice Faster. Cornett, MD;  Location: [facility];  Service: General;  Laterality:  N/A;  ? COLONOSCOPY    ? numerous times  ? DILATION AND CURETTAGE OF UTERUS    ? ESOPHAGOGASTRODUODENOSCOPY    ? [place]  ? x3  ? IUD REMOVAL  02/2015  ? Mirena  ? LAPAROSCOPIC LYSIS OF ADHESIONS N/A 10/16/2012  ? Procedure: LAPAROSCOPIC LYSIS OF ADHESIONS;  Surgeon: Joyice Faster. Cornett, MD;  Location: [facility];  Service: General;  Laterality: N/A;  ? LAPAROSCOPY N/A 10/16/2012  ? Procedure: LAPAROSCOPY DIAGNOSTIC;  Surgeon: Joyice Faster. Cornett, MD;  Location: [facility];  Service: General;  Laterality: N/A;  ? PELVIC LAPAROSCOPY    ? RADIOLOGY WITH ANESTHESIA N/A 12/16/2015  ? Procedure: MRI OF BRAIN WITH AND WITHOUT CONTRAST;  Surgeon: Medication Radiologist, MD;  Location: [place];  Service: Radiology;  Laterality: N/A;  ? SHOULDER SURGERY  2007/2008  ? [place] SURGERY  2010  ? cervical  ? ?Patient Active Problem List  ? Diagnosis Date Noted  ? GAD (generalized anxiety disorder) 12/26/2017  ? OCD (obsessive compulsive disorder) 12/26/2017  ? PTSD (post-traumatic stress disorder) 12/26/2017  ? DDD (degenerative disc disease), cervical 07/14/2016  ? Primary osteoarthritis of both feet 07/14/2016  ?  Primary osteoarthritis of both hands 07/14/2016  ? Other fatigue 07/14/2016  ? History of IBS 07/14/2016  ? Osteopenia of multiple sites 07/14/2016  ? Fibromyalgia 01/13/2016  ? MGUS (monoclonal gammopathy of unknown significance) 12/23/2013  ? Chronic cholecystitis without calculus 10/18/2012  ? Abdominal pain, unspecified site 10/18/2012  ? Nausea alone 10/18/2012  ? Unspecified constipation 10/18/2012  ? Depression 10/18/2012  ? Anxiety   ? ASCUS (atypical squamous cells of undetermined significance) on Pap smear   ? IUD   ? Hemorrhoids 11/29/2010  ? Abdominal pain, left upper quadrant 11/29/2010  ? ? ?PCP: Mayra Neer, MD ? ?REFERRING PROVIDER: Mayra Neer, MD ? ?REFERRING DIAG: Polymyalgia rheumatica ? ?THERAPY DIAG:  ?Polymyalgia rheumatica ([place]) ? ?Muscle weakness (generalized) ? ?Cramp and spasm ? ?ONSET  DATE: 07/29/19 (chronic) ? ?SUBJECTIVE:                                                                                                                                                                                          ? ?SUBJECTIVE STATEMENT: ?I've had a period of migraines so I had to cancel some appts.  I'm doing yard work and staying busy.  My head, neck and shoulders are my main concern.  The water therapy is helping everything else.  I am feeling so much better and able to do more at home.  ?  ?PERTINENT HISTORY:  ?Anxiety, bipolar, depression, fibromyalgia, IBS, migraines ? ?PAIN:  ?Are you having pain? yes ?NPRS scale:4/10 ?Pain location: Lt neck ?Pain orientation: Lt ?PAIN TYPE: tight and irritated ?Pain description: Intermittent ?Aggravating factors: overdoing things with grandkids, too much activiity ?Relieving factors: pool exercises, stretching ? ?PRECAUTIONS: Other: slow progression with exercise due to chronic condition ? ?PATIENT GOALS be more active with less pain, exercise at the gym regularly ? ? ?OBJECTIVE: 05/05/21  ? ?PATIENT SURVEYS:  ?NDI 42% limitation  ? ?POSTURE:  ?Forward head and rounded shoulders ? ?PALPATION: ?Palpable tenderness and tension in neck and lumbar regions diffusely.  ? ?TODAY'S TREATMENT  ?Treatment on date: 06/15/21 ?Manual to bil neck and upper traps and rhomboids ?Trigger Point Dry-Needling  ?Treatment instructions: Expect mild to moderate muscle soreness. S/S of pneumothorax if dry needled over a lung field, and to seek immediate medical attention should they occur. Patient verbalized understanding of these instructions and education. ? ?Patient Consent Given: Yes ?Education handout provided: Previously provided ?Muscles treated: Bil oblique capitus, levator and upper traps  ?Treatment response/outcome: twitch and increased tissue length  ? ?Skilled palpation and monitoring by PT during dry needling  ? ?06/01/21: Aquatic treatment: Pt arrives for aquatic physical  therapy. Treatment took place in  3.5-5.5 feet of water. Water temperature was 92 degrees F. Pt entered the pool via stairs with mild use of rails. Pt requires buoyancy of water for support and to offload joints with strengthening exercises. Pt utilizes viscosity of the water required for strengthening.   ?Standing in 50%- 75% depth water pt performed water walking in all 4 directions 10x. VC for speed in order to generate appropriate current for resistance and/or UE movements. Hand paddles added for side stepping and yellow noodle for push/pull. ?Wall exercises to inc: Hip 3 ways 20x ea with min support of pool wall. VC to push/pull against water with a force appropriate. Core contractions in partial wall squat with blue noodle 5 sec hold 10x. Bil heel raises 20x no UE support. High knee marching holding water weights under water 4x. Trunk rotation with kickboard 20x.Hand paddles for shoulder ext/flex 2x10, horizontal add/abd 2x 10, VC to maintain core throughout exercises. Underwater bicycle in horseback using large noodle for 10 min. Blue noodle single leg press 10x Bil.  ? ?05/25/21: ?Aquatic treatment: Pt arrives for aquatic physical therapy. Treatment took place in 3.5-5.5 feet of water. Water temperature was 90 degrees F. Pt entered the pool via stairs with mild use of rails. Pt requires buoyancy of water for support and to offload joints with strengthening exercises. Pt utilizes viscosity of the water required for strengthening.   ? ?Standing in 50%- 75% depth water pt performed water walking in all 4 directions 10x. VC for speed in order to generate appropriate current for resistance and/or UE movements. Hand paddles added for side stepping and yellow noodle for push/pull. ? ?Wall exercises to inc: Hip 3 ways 20x ea with min support of pool wall. VC to push/pull against water with a force appropriate. Core contractions in partial wall squat with blue noodle 5 sec hold 10x. Bil heel raises 20x no UE support.  High knee marching holding water weights under water 4x. Trunk rotation with kickboard 10x.Hand paddles for shoulder ext/flex 2x10, horizontal add/abd 2x 10, VC to maintain core throughout exercises. Underw

## 2021-06-16 DIAGNOSIS — E7800 Pure hypercholesterolemia, unspecified: Secondary | ICD-10-CM | POA: Diagnosis not present

## 2021-06-16 DIAGNOSIS — R7301 Impaired fasting glucose: Secondary | ICD-10-CM | POA: Diagnosis not present

## 2021-06-16 DIAGNOSIS — F411 Generalized anxiety disorder: Secondary | ICD-10-CM | POA: Diagnosis not present

## 2021-06-16 DIAGNOSIS — G43909 Migraine, unspecified, not intractable, without status migrainosus: Secondary | ICD-10-CM | POA: Diagnosis not present

## 2021-06-16 DIAGNOSIS — F900 Attention-deficit hyperactivity disorder, predominantly inattentive type: Secondary | ICD-10-CM | POA: Diagnosis not present

## 2021-06-16 DIAGNOSIS — M797 Fibromyalgia: Secondary | ICD-10-CM | POA: Diagnosis not present

## 2021-06-17 ENCOUNTER — Ambulatory Visit: Payer: Medicare Other | Admitting: Physical Therapy

## 2021-06-17 NOTE — Therapy (Deleted)
OUTPATIENT PHYSICAL THERAPY TREATMENT   Patient Name: YAELIS SCHARFENBERG MRN: [id number] DOB:1960/11/22, 61 y.o., female Today's Date: 06/17/2021           Past Medical History:  Diagnosis Date   Anemia    Anxiety    Arthritis    osteoarthritis   ASCUS (atypical squamous cells of undetermined significance) on Pap smear 05/06/2005   NEG HIGH RISK HPV--C&B BIOPSY BENIGN 12/2005   Asthma    Bipolar 2 disorder ([facility])    Cancer ([place])    skin cancer - basal cell   Colon polyps    Complication of anesthesia    anxious afterwards, will get headaches    Constipation    Depression    Fibromyalgia 10/2013   GERD (gastroesophageal reflux disease)    Hearing loss on left    Heart murmur    never had any problems   Hemorrhoids    High cholesterol    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   cytology negative   IBS (irritable bowel syndrome)    Insomnia    Lymphocytic colitis    Lymphoma ([facility])    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   Bone marrow biopsy showes 8% plasma cells IgA Lambda   Migraines    Osteoarthritis    Osteopenia    Peripheral neuropathy    PTSD (post-traumatic stress disorder)    Past Surgical History:  Procedure Laterality Date   BONE MARROW BIOPSY Left 12/18/2013   Plasma cell dyscrasia 8% population of plasma cells   BREAST BIOPSY Right    benign stereo   CESAREAN SECTION  19,41,74   CHOLECYSTECTOMY N/A 10/16/2012   Procedure: LAPAROSCOPIC CHOLECYSTECTOMY;  Surgeon: Joyice Faster. Cornett, MD;  [facility];  Service: General;  Laterality: N/A;   COLONOSCOPY     numerous times   DILATION AND CURETTAGE OF UTERUS     ESOPHAGOGASTRODUODENOSCOPY     HEMORRHOID SURGERY  1993   x3   IUD REMOVAL  02/2015   Mirena   LAPAROSCOPIC LYSIS OF ADHESIONS N/A 10/16/2012   Procedure: LAPAROSCOPIC LYSIS OF ADHESIONS;  Surgeon: Marcello Moores A. Cornett, MD;  [facility];  Service: General;  Laterality: N/A;   LAPAROSCOPY N/A 10/16/2012   Procedure:  LAPAROSCOPY DIAGNOSTIC;  Surgeon: Joyice Faster. Cornett, MD;  [facility];  Service: General;  Laterality: N/A;   PELVIC LAPAROSCOPY     RADIOLOGY WITH ANESTHESIA N/A 12/16/2015   Procedure: MRI OF BRAIN WITH AND WITHOUT CONTRAST;  Surgeon: Medication Radiologist, MD;  Location: [facility];  Service: Radiology;  Laterality: N/A;   SHOULDER SURGERY  2007/2008   SPINE SURGERY  2010   cervical   Patient Active Problem List   Diagnosis Date Noted   GAD (generalized anxiety disorder) 12/26/2017   OCD (obsessive compulsive disorder) 12/26/2017   PTSD (post-traumatic stress disorder) 12/26/2017   DDD (degenerative disc disease), cervical 07/14/2016   Primary osteoarthritis of both feet 07/14/2016   Primary osteoarthritis of both hands 07/14/2016   Other fatigue 07/14/2016   History of IBS 07/14/2016   Osteopenia of multiple sites 07/14/2016   Fibromyalgia 01/13/2016   MGUS (monoclonal gammopathy of unknown significance) 12/23/2013   Chronic cholecystitis without calculus 10/18/2012   Abdominal pain, unspecified site 10/18/2012   Nausea alone 10/18/2012   Unspecified constipation 10/18/2012   Depression 10/18/2012   Anxiety    ASCUS (atypical squamous cells of undetermined significance) on Pap smear    IUD    Hemorrhoids 11/29/2010   Abdominal  pain, left upper quadrant 11/29/2010    PCP: Mayra Neer, MD  REFERRING PROVIDER: Mayra Neer, MD  REFERRING DIAG: Polymyalgia rheumatica  THERAPY DIAG:  Polymyalgia rheumatica ([place])  Muscle weakness (generalized)  Cramp and spasm  Abnormal posture  ONSET DATE: 07/29/19 (chronic)  SUBJECTIVE:                                                                                                                                                                                           SUBJECTIVE STATEMENT: I've had a period of migraines so I had to cancel some appts.  I'm doing yard work and staying busy.  My head, neck and shoulders are my  main concern.  The water therapy is helping everything else.  I am feeling so much better and able to do more at home.    PERTINENT HISTORY:  Anxiety, bipolar, depression, fibromyalgia, IBS, migraines  PAIN:  Are you having pain? yes NPRS scale:4/10 Pain location: Lt neck Pain orientation: Lt PAIN TYPE: tight and irritated Pain description: Intermittent Aggravating factors: overdoing things with grandkids, too much activiity Relieving factors: pool exercises, stretching  PRECAUTIONS: Other: slow progression with exercise due to chronic condition  PATIENT GOALS be more active with less pain, exercise at the gym regularly   OBJECTIVE: 05/05/21   PATIENT SURVEYS:  NDI 42% limitation   POSTURE:  Forward head and rounded shoulders  PALPATION: Palpable tenderness and tension in neck and lumbar regions diffusely.   TODAY'S TREATMENT  06/17/21: Aquatic Session: Pt arrives for aquatic physical therapy. Treatment took place in 3.5-5.5 feet of water. Water temperature was 92 degrees F. Pt entered the pool via stairs with mild use of rails. Pt requires buoyancy of water for support and to offload joints with strengthening exercises. Pt utilizes viscosity of the water required for strengthening.   Standing in 50%- 75% depth water pt performed water walking in all 4 directions 10x. VC for speed in order to generate appropriate current for resistance and/or UE movements. Hand paddles added for side stepping and yellow noodle for push/pull. Wall exercises to inc: Hip 3 ways 20x ea with min support of pool wall. VC to push/pull against water with a force appropriate. Core contractions in partial wall squat with blue noodle 5 sec hold 10x. Bil heel raises 20x no UE support. High knee marching holding water weights under water 4x. Trunk rotation with kickboard 20x.Hand paddles for shoulder ext/flex 2x10, horizontal add/abd 2x 10, VC to maintain core throughout exercises. Underwater bicycle in horseback  using large noodle for 10 min. Blue noodle single leg press 10x Bil.   Treatment on date: 06/15/21  Manual to bil neck and upper traps and rhomboids Trigger Point Dry-Needling  Treatment instructions: Expect mild to moderate muscle soreness. S/S of pneumothorax if dry needled over a lung field, and to seek immediate medical attention should they occur. Patient verbalized understanding of these instructions and education.  Patient Consent Given: Yes Education handout provided: Previously provided Muscles treated: Bil oblique capitus, levator and upper traps  Treatment response/outcome: twitch and increased tissue length   Skilled palpation and monitoring by PT during dry needling   06/01/21: Aquatic treatment: Pt arrives for aquatic physical therapy. Treatment took place in 3.5-5.5 feet of water. Water temperature was 92 degrees F. Pt entered the pool via stairs with mild use of rails. Pt requires buoyancy of water for support and to offload joints with strengthening exercises. Pt utilizes viscosity of the water required for strengthening.   Standing in 50%- 75% depth water pt performed water walking in all 4 directions 10x. VC for speed in order to generate appropriate current for resistance and/or UE movements. Hand paddles added for side stepping and yellow noodle for push/pull. Wall exercises to inc: Hip 3 ways 20x ea with min support of pool wall. VC to push/pull against water with a force appropriate. Core contractions in partial wall squat with blue noodle 5 sec hold 10x. Bil heel raises 20x no UE support. High knee marching holding water weights under water 4x. Trunk rotation with kickboard 20x.Hand paddles for shoulder ext/flex 2x10, horizontal add/abd 2x 10, VC to maintain core throughout exercises. Underwater bicycle in horseback using large noodle for 10 min. Blue noodle single leg press 10x Bil.   05/25/21: Aquatic treatment: Pt arrives for aquatic physical therapy. Treatment took place in  3.5-5.5 feet of water. Water temperature was 90 degrees F. Pt entered the pool via stairs with mild use of rails. Pt requires buoyancy of water for support and to offload joints with strengthening exercises. Pt utilizes viscosity of the water required for strengthening.    Standing in 50%- 75% depth water pt performed water walking in all 4 directions 10x. VC for speed in order to generate appropriate current for resistance and/or UE movements. Hand paddles added for side stepping and yellow noodle for push/pull.  Wall exercises to inc: Hip 3 ways 20x ea with min support of pool wall. VC to push/pull against water with a force appropriate. Core contractions in partial wall squat with blue noodle 5 sec hold 10x. Bil heel raises 20x no UE support. High knee marching holding water weights under water 4x. Trunk rotation with kickboard 10x.Hand paddles for shoulder ext/flex 2x10, horizontal add/abd 2x 10, VC to maintain core throughout exercises. Underwater bicycle in horseback using large noodle for 10 min.  HOME EXERCISE PROGRAM: Access Code: [email or web address]  ASSESSMENT:  CLINICAL IMPRESSION: Pt arrived doing very well after a period of migraines that left her immobile.  Pt is active with yard work and reports that [employer] are helping her. Pt with tension in Lt>Rt neck today and responded well to manual therapy with reduced symptoms after treatment today.  Pt plans to continue with PT for aquatics and manual therapy until she returns from her trip to [place] and will then join [employer] for independent exercise after.    OBJECTIVE IMPAIRMENTS decreased endurance, decreased mobility, difficulty walking, decreased ROM, decreased strength, increased muscle spasms, improper body mechanics, postural dysfunction, and pain.   ACTIVITY LIMITATIONS cleaning, community activity, driving, meal prep, and yard work.   PERSONAL FACTORS Past/current experiences and 1-2 comorbidities: fibromyalgia,  mental illness  are also  affecting patient's functional outcome.    GOALS: Goals reviewed with patient? Yes  SHORT TERM GOALS:  Initiate gym exercise program and report compliance 1x/wk Baseline: not doing this now Target date: 07/29/2021 Goal status: INITIAL  2.  Walk for exercise 3x/wk and verbalize safe progression Baseline: not walking now due to depression Target date: 07/29/2021 Goal status: INITIAL  LONG TERM GOALS:  be independent in advanced HEP with completion 4-5x/wk  Baseline: doing exercises only when things hurt Target date: 09/09/2021 Goal status: IN PROGRESS  2.  Pt will be able to walk at least 1 mile without increased limitation for ability to exercise and community function.  Baseline: has not been walking due to depression.  Not walking now.   Target date: 09/09/2021 Goal status: IN PROGRESS  3.  report 50% less neck/UE pain throughout the day with normal functional activities.  Baseline:  Target date: 09/09/2021 Goal status: INITIAL  4.  initiate a gym exercise program and participate > or = to 1x/wk  Baseline: going to the pool 1x for aquatics.  Will join gym soon  Target date: 09/09/2021 Goal status: IN PROGRESS  5.  Pt will demonstrate at least hip strength of 4+/5 for  improved stability for walking and lifting her grandchildren  Baseline:  Target date: 09/09/2021 Goal status: IN PROGRESS  6.  improve NDI to < or = to 30% disability  Baseline: 42 Target date: 09/09/2021 Goal status: IN PROGRESS  PLAN: PT FREQUENCY: 1-2x/week  PT DURATION: 12 weeks  PLANNED INTERVENTIONS: Therapeutic exercises, Therapeutic activity, Neuromuscular re-education, Balance training, Gait training, Patient/Family education, Joint mobilization, Aquatic Therapy, Dry Needling, Electrical stimulation, Cryotherapy, Moist heat, Taping, and Manual therapy  PLAN FOR NEXT SESSION: Continue aquatic therapy, pain management, land based PT for manual therapy as needed.      Sigurd Sos, PT 06/17/21  12:22 PM

## 2021-06-22 DIAGNOSIS — Z85828 Personal history of other malignant neoplasm of skin: Secondary | ICD-10-CM | POA: Diagnosis not present

## 2021-06-22 DIAGNOSIS — L821 Other seborrheic keratosis: Secondary | ICD-10-CM | POA: Diagnosis not present

## 2021-06-22 DIAGNOSIS — D1801 Hemangioma of skin and subcutaneous tissue: Secondary | ICD-10-CM | POA: Diagnosis not present

## 2021-06-22 DIAGNOSIS — L814 Other melanin hyperpigmentation: Secondary | ICD-10-CM | POA: Diagnosis not present

## 2021-06-22 DIAGNOSIS — D225 Melanocytic nevi of trunk: Secondary | ICD-10-CM | POA: Diagnosis not present

## 2021-06-28 NOTE — Therapy (Signed)
?OUTPATIENT PHYSICAL THERAPY TREATMENT ? ? ?Patient Name: Danielle Bruce ?MRN: [id number] ?DOB:05-19-1960, 61 y.o., female ?Today's Date: 06/29/2021 ? ? PT End of Session - 06/29/21 1148   ? ? Visit Number 46   ? Date for PT Re-Evaluation 07/28/21   ? Authorization Type Medicare/Medicaid- KX needed at 64 for 2023   ? Progress Note Due on Visit 49   ? PT Start Time 1050   ? PT Stop Time 1140   ? PT Time Calculation (min) 50 min   ? Activity Tolerance Patient tolerated treatment well   ? Behavior During Therapy [facility] for tasks assessed/performed   ? ?  ?  ? ?  ? ? ? ? ? ? ?  ? ?Past Medical History:  ?Diagnosis Date  ? Anemia   ? Anxiety   ? Arthritis   ? osteoarthritis  ? ASCUS (atypical squamous cells of undetermined significance) on Pap smear 05/06/2005  ? NEG HIGH RISK HPV--C&B BIOPSY BENIGN 12/2005  ? Asthma   ? Bipolar 2 disorder ([place])   ? Cancer [facility])   ? skin cancer - basal cell  ? Colon polyps   ? Complication of anesthesia   ? anxious afterwards, will get headaches   ? Constipation   ? Depression   ? Fibromyalgia 10/2013  ? GERD (gastroesophageal reflux disease)   ? Hearing loss on left   ? Heart murmur   ? never had any problems  ? Hemorrhoids   ? High cholesterol   ? 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  ? cytology negative  ? IBS (irritable bowel syndrome)   ? Insomnia   ? Lymphocytic colitis   ? Lymphoma ([place])   ? 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 Bone marrow biopsy showes 8% plasma cells IgA Lambda  ? Migraines   ? Osteoarthritis   ? Osteopenia   ? Peripheral neuropathy   ? PTSD (post-traumatic stress disorder)   ? ?Past Surgical History:  ?Procedure Laterality Date  ? BONE MARROW BIOPSY Left 12/18/2013  ? 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 BREAST BIOPSY Right   ? benign stereo  ? CESAREAN SECTION  [phone]  ? CHOLECYSTECTOMY N/A 10/16/2012  ? Procedure: LAPAROSCOPIC CHOLECYSTECTOMY;  Surgeon: Joyice Faster. Cornett, MD;  Location: [facility];  Service: General;   Laterality: N/A;  ? COLONOSCOPY    ? numerous times  ? DILATION AND CURETTAGE OF UTERUS    ? ESOPHAGOGASTRODUODENOSCOPY    ? [place]  ? x3  ? IUD REMOVAL  02/2015  ? Mirena  ? LAPAROSCOPIC LYSIS OF ADHESIONS N/A 10/16/2012  ? Procedure: LAPAROSCOPIC LYSIS OF ADHESIONS;  Surgeon: Joyice Faster. Cornett, MD;  Location: [facility];  Service: General;  Laterality: N/A;  ? LAPAROSCOPY N/A 10/16/2012  ? Procedure: LAPAROSCOPY DIAGNOSTIC;  Surgeon: Joyice Faster. Cornett, MD;  Location[facility];  Service: General;  Laterality: N/A;  ? PELVIC LAPAROSCOPY    ? RADIOLOGY WITH ANESTHESIA N/A 12/16/2015  ? Procedure: MRI OF BRAIN WITH AND WITHOUT CONTRAST;  Surgeon: Medication Radiologist, MD;  Location: [place];  Service: Radiology;  Laterality: N/A;  ? SHOULDER SURGERY  2007/2008  ? [place] SURGERY  2010  ? cervical  ? ?Patient Active Problem List  ? Diagnosis Date Noted  ? GAD (generalized anxiety disorder) 12/26/2017  ? OCD (obsessive compulsive disorder) 12/26/2017  ? PTSD (post-traumatic stress disorder) 12/26/2017  ? DDD (degenerative disc disease), cervical 07/14/2016  ? Primary osteoarthritis of both feet  07/14/2016  ? Primary osteoarthritis of both hands 07/14/2016  ? Other fatigue 07/14/2016  ? History of IBS 07/14/2016  ? Osteopenia of multiple sites 07/14/2016  ? Fibromyalgia 01/13/2016  ? MGUS (monoclonal gammopathy of unknown significance) 12/23/2013  ? Chronic cholecystitis without calculus 10/18/2012  ? Abdominal pain, unspecified site 10/18/2012  ? Nausea alone 10/18/2012  ? Unspecified constipation 10/18/2012  ? Depression 10/18/2012  ? Anxiety   ? ASCUS (atypical squamous cells of undetermined significance) on Pap smear   ? IUD   ? Hemorrhoids 11/29/2010  ? Abdominal pain, left upper quadrant 11/29/2010  ? ? ?PCP: Mayra Neer, MD ? ?REFERRING PROVIDER: Mayra Neer, MD ? ?REFERRING DIAG: Polymyalgia rheumatica ? ?THERAPY DIAG:  ?Polymyalgia rheumatica ([place]) ? ?Muscle weakness (generalized) ? ?Cramp and  spasm ? ?Abnormal posture ? ?ONSET DATE: 07/29/19 (chronic) ? ?SUBJECTIVE:                                                                                                                                                                                          ? ?SUBJECTIVE STATEMENT: ?Migraines are behind me now.   ?PERTINENT HISTORY:  ?Anxiety, bipolar, depression, fibromyalgia, IBS, migraines ? ?PAIN:  ?Are you having pain? yes ?NPRS scale:4/10 ?Pain location: Lt neck ?Pain orientation: Lt ?PAIN TYPE: tight and irritated ?Pain description: Intermittent ?Aggravating factors: overdoing things with grandkids, too much activiity ?Relieving factors: pool exercises, stretching ? ?PRECAUTIONS: Other: slow progression with exercise due to chronic condition ? ?PATIENT GOALS be more active with less pain, exercise at the gym regularly ? ? ?OBJECTIVE: 05/05/21  ? ?PATIENT SURVEYS:  ?NDI 42% limitation  ? ?POSTURE:  ?Forward head and rounded shoulders ? ?PALPATION: ?Palpable tenderness and tension in neck and lumbar regions diffusely.  ? ?TODAY'S TREATMENT  ?06/28/21: Pt arrives for aquatic physical therapy. Treatment took place in 3.5-5.5 feet of water. Water temperature was 92 degrees F. Pt entered the pool via stairs with mild use of the rails.  ?Pt requires buoyancy of water for support and to offload joints with strengthening exercises.   ?Standing in 50%- 75% depth water pt performed water walking in all 4 directions 10x. VC for speed in order to generate appropriate current for resistance and/or UE movements. Hand paddles added for side stepping.   ?Wall exercises to inc: Hip 3 ways 20x ea with min support of pool wall. VC to push/pull against water with a force appropriate. Core contractions in partial wall squat with blue noodle 5 sec hold 10x. Bil heel raises 20x no UE support. High knee marching holding water weights under water 4x. Trunk rotation with kickboard 20x.Hand paddles for shoulder ext/flex  2x10, horizontal  add/abd 2x 10, VC to maintain core throughout exercises. Underwater bicycle in horseback using large noodle for 10 min. Blue noodle single leg press 20x Bil.  ?06/17/21: Aquatic Session: ?Pt arrives for aquatic physical therapy. Treatment took place in 3.5-5.5 feet of water. Water temperature was 92 degrees F. Pt entered the pool via stairs with mild use of rails. Pt requires buoyancy of water for support and to offload joints with strengthening exercises. Pt utilizes viscosity of the water required for strengthening.   ?Standing in 50%- 75% depth water pt performed water walking in all 4 directions 10x. VC for speed in order to generate appropriate current for resistance and/or UE movements. Hand paddles added for side stepping and yellow noodle for push/pull. ?Wall exercises to inc: Hip 3 ways 20x ea with min support of pool wall. VC to push/pull against water with a force appropriate. Core contractions in partial wall squat with blue noodle 5 sec hold 10x. Bil heel raises 20x no UE support. High knee marching holding water weights under water 4x. Trunk rotation with kickboard 20x.Hand paddles for shoulder ext/flex 2x10, horizontal add/abd 2x 10, VC to maintain core throughout exercises. Underwater bicycle in horseback using large noodle for 10 min. Blue noodle single leg press 10x Bil.  ? ?Treatment on date: 06/15/21 ?Manual to bil neck and upper traps and rhomboids ?Trigger Point Dry-Needling  ?Treatment instructions: Expect mild to moderate muscle soreness. S/S of pneumothorax if dry needled over a lung field, and to seek immediate medical attention should they occur. Patient verbalized understanding of these instructions and education. ? ?Patient Consent Given: Yes ?Education handout provided: Previously provided ?Muscles treated: Bil oblique capitus, levator and upper traps  ?Treatment response/outcome: twitch and increased tissue length  ? ?Skilled palpation and monitoring by PT during dry needling  ? ?06/01/21:  Aquatic treatment: Pt arrives for aquatic physical therapy. Treatment took place in 3.5-5.5 feet of water. Water temperature was 92 degrees F. Pt entered the pool via stairs with mild use of rails. Pt requires buoya

## 2021-06-29 ENCOUNTER — Ambulatory Visit: Payer: Medicare Other | Admitting: Addiction (Substance Use Disorder)

## 2021-06-29 ENCOUNTER — Ambulatory Visit: Payer: Medicare Other | Attending: Family Medicine | Admitting: Physical Therapy

## 2021-06-29 ENCOUNTER — Encounter: Payer: Self-pay | Admitting: Physical Therapy

## 2021-06-29 DIAGNOSIS — R293 Abnormal posture: Secondary | ICD-10-CM | POA: Diagnosis not present

## 2021-06-29 DIAGNOSIS — M353 Polymyalgia rheumatica: Secondary | ICD-10-CM | POA: Diagnosis not present

## 2021-06-29 DIAGNOSIS — M6281 Muscle weakness (generalized): Secondary | ICD-10-CM | POA: Diagnosis not present

## 2021-06-29 DIAGNOSIS — R252 Cramp and spasm: Secondary | ICD-10-CM | POA: Diagnosis not present

## 2021-07-04 ENCOUNTER — Ambulatory Visit: Payer: Medicare Other

## 2021-07-04 DIAGNOSIS — R293 Abnormal posture: Secondary | ICD-10-CM | POA: Diagnosis not present

## 2021-07-04 DIAGNOSIS — M353 Polymyalgia rheumatica: Secondary | ICD-10-CM

## 2021-07-04 DIAGNOSIS — M6281 Muscle weakness (generalized): Secondary | ICD-10-CM

## 2021-07-04 DIAGNOSIS — R252 Cramp and spasm: Secondary | ICD-10-CM

## 2021-07-04 NOTE — Therapy (Signed)
?OUTPATIENT PHYSICAL THERAPY TREATMENT ? ? ?Patient Name: Danielle Bruce ?MRN: [id number] ?DOB:1960/07/07, 61 y.o., female ?Today's Date: 07/06/2021 ? ? PT End of Session - 07/06/21 1200   ? ? Visit Number 80   ? Date for PT Re-Evaluation 07/28/21   ? Authorization Type Medicare/Medicaid- KX needed at 34 for 2023   ? Progress Note Due on Visit 49   ? PT Start Time 1115   ? PT Stop Time 1200   ? PT Time Calculation (min) 45 min   ? Activity Tolerance Patient tolerated treatment well   ? Behavior During Therapy [facility] for tasks assessed/performed   ? ?  ?  ? ?  ? ? ? ? ? ? ? ? ?  ? ?Past Medical History:  ?Diagnosis Date  ? Anemia   ? Anxiety   ? Arthritis   ? osteoarthritis  ? ASCUS (atypical squamous cells of undetermined significance) on Pap smear 05/06/2005  ? NEG HIGH RISK HPV--C&B BIOPSY BENIGN 12/2005  ? Asthma   ? Bipolar 2 disorder ([place])   ? Cancer [facility])   ? skin cancer - basal cell  ? Colon polyps   ? Complication of anesthesia   ? anxious afterwards, will get headaches   ? Constipation   ? Depression   ? Fibromyalgia 10/2013  ? GERD (gastroesophageal reflux disease)   ? Hearing loss on left   ? Heart murmur   ? never had any problems  ? Hemorrhoids   ? High cholesterol   ? 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  ? cytology negative  ? IBS (irritable bowel syndrome)   ? Insomnia   ? Lymphocytic colitis   ? Lymphoma ([place])   ? 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 Bone marrow biopsy showes 8% plasma cells IgA Lambda  ? Migraines   ? Osteoarthritis   ? Osteopenia   ? Peripheral neuropathy   ? PTSD (post-traumatic stress disorder)   ? ?Past Surgical History:  ?Procedure Laterality Date  ? BONE MARROW BIOPSY Left 12/18/2013  ? 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 BREAST BIOPSY Right   ? benign stereo  ? CESAREAN SECTION  [phone]  ? CHOLECYSTECTOMY N/A 10/16/2012  ? Procedure: LAPAROSCOPIC CHOLECYSTECTOMY;  Surgeon: Joyice Faster. Cornett, MD;  [facility];  Service: General;   Laterality: N/A;  ? COLONOSCOPY    ? numerous times  ? DILATION AND CURETTAGE OF UTERUS    ? ESOPHAGOGASTRODUODENOSCOPY    ? [place]  ? x3  ? IUD REMOVAL  02/2015  ? Mirena  ? LAPAROSCOPIC LYSIS OF ADHESIONS N/A 10/16/2012  ? Procedure: LAPAROSCOPIC LYSIS OF ADHESIONS;  Surgeon: Joyice Faster. Cornett, MD;  [facility];  Service: General;  Laterality: N/A;  ? LAPAROSCOPY N/A 10/16/2012  ? Procedure: LAPAROSCOPY DIAGNOSTIC;  Surgeon: Joyice Faster. Cornett, MD;  [facility];  Service: General;  Laterality: N/A;  ? PELVIC LAPAROSCOPY    ? RADIOLOGY WITH ANESTHESIA N/A 12/16/2015  ? Procedure: MRI OF BRAIN WITH AND WITHOUT CONTRAST;  Surgeon: Medication Radiologist, MD;  Location: [place];  Service: Radiology;  Laterality: N/A;  ? SHOULDER SURGERY  2007/2008  ? [place] SURGERY  2010  ? cervical  ? ?Patient Active Problem List  ? Diagnosis Date Noted  ? GAD (generalized anxiety disorder) 12/26/2017  ? OCD (obsessive compulsive disorder) 12/26/2017  ? PTSD (post-traumatic stress disorder) 12/26/2017  ? DDD (degenerative disc disease), cervical 07/14/2016  ? Primary osteoarthritis of  both feet 07/14/2016  ? Primary osteoarthritis of both hands 07/14/2016  ? Other fatigue 07/14/2016  ? History of IBS 07/14/2016  ? Osteopenia of multiple sites 07/14/2016  ? Fibromyalgia 01/13/2016  ? MGUS (monoclonal gammopathy of unknown significance) 12/23/2013  ? Chronic cholecystitis without calculus 10/18/2012  ? Abdominal pain, unspecified site 10/18/2012  ? Nausea alone 10/18/2012  ? Unspecified constipation 10/18/2012  ? Depression 10/18/2012  ? Anxiety   ? ASCUS (atypical squamous cells of undetermined significance) on Pap smear   ? IUD   ? Hemorrhoids 11/29/2010  ? Abdominal pain, left upper quadrant 11/29/2010  ? ? ?PCP: Mayra Neer, MD ? ?REFERRING PROVIDER: Mayra Neer, MD ? ?REFERRING DIAG: Polymyalgia rheumatica ? ?THERAPY DIAG:  ?Polymyalgia rheumatica ([place]) ? ?Muscle weakness (generalized) ? ?Cramp and  spasm ? ?Abnormal posture ? ?ONSET DATE: 07/29/19 (chronic) ? ?SUBJECTIVE:                                                                                                                                                                                          ? ?SUBJECTIVE STATEMENT: ?I was really sore after the last dry needling. ?PERTINENT HISTORY:  ?Anxiety, bipolar, depression, fibromyalgia, IBS, migraines ? ?PAIN:  ?Are you having pain? yes ?NPRS scale:4/10 ?Pain location: Lt neck ?Pain orientation: Lt ?PAIN TYPE: tight and irritated ?Pain description: Intermittent ?Aggravating factors: overdoing things with grandkids, too much activiity ?Relieving factors: pool exercises, stretching ? ?PRECAUTIONS: Other: slow progression with exercise due to chronic condition ? ?PATIENT GOALS be more active with less pain, exercise at the gym regularly ? ? ?OBJECTIVE: 05/05/21  ? ?PATIENT SURVEYS:  ?NDI 42% limitation  ? ?POSTURE:  ?Forward head and rounded shoulders ? ?PALPATION: ?Palpable tenderness and tension in neck and lumbar regions diffusely.  ? ?TODAY'S TREATMENT  ? ?07/06/21: Pt arrives for aquatic physical therapy. Treatment took place in 3.5-5.5 feet of water. Water temperature was 92 degrees F. Pt entered the pool via stairs with mild use of the rails.  ?Pt requires buoyancy of water for support and to offload joints with strengthening exercises.  Pt utilizes viscosity of the water required for strengthening.  ?Standing in 50%- 75% depth water pt performed water walking in all 4 directions 10x. VC for speed in order to generate appropriate current for resistance and/or UE movements. Hand paddles added for side stepping.   ?Wall exercises to inc: Hip 3 ways 20x ea with min support of pool wall. VC to push/pull against water with a force appropriate. Core contractions in partial wall squat with blue noodle 5 sec hold 10x. Bil heel raises 20x no UE support. High knee marching holding  water weights under water 4x. Trunk  rotation with kickboard 20x.Hand paddles for shoulder ext/flex 2x10, horizontal add/abd 2x 10, VC to maintain core throughout exercises. Underwater bicycle in horseback using large noodle for 10 min. Blue noodle single leg press 20x Bil.  ? ?Treatment on date: 07/04/21 ?Trigger Point Dry-Needling  ?Treatment instructions: Expect mild to moderate muscle soreness. S/S of pneumothorax if dry needled over a lung field, and to seek immediate medical attention should they occur. Patient verbalized understanding of these instructions and education. ? ?Patient Consent Given: Yes ?Education handout provided: Previously provided ?Muscles treated: Bil oblique capitus, levator and upper traps  ?Treatment response/outcome: twitch response, increased tissue elongation  ? ?Skilled palpation and monitoring by PT during dry needling  ?Manual: elongation and release to bil neck and upper traps ? ? ?06/28/21: Pt arrives for aquatic physical therapy. Treatment took place in 3.5-5.5 feet of water. Water temperature was 92 degrees F. Pt entered the pool via stairs with mild use of the rails.  ?Pt requires buoyancy of water for support and to offload joints with strengthening exercises.   ?Standing in 50%- 75% depth water pt performed water walking in all 4 directions 10x. VC for speed in order to generate appropriate current for resistance and/or UE movements. Hand paddles added for side stepping.   ?Wall exercises to inc: Hip 3 ways 20x ea with min support of pool wall. VC to push/pull against water with a force appropriate. Core contractions in partial wall squat with blue noodle 5 sec hold 10x. Bil heel raises 20x no UE support. High knee marching holding water weights under water 4x. Trunk rotation with kickboard 20x.Hand paddles for shoulder ext/flex 2x10, horizontal add/abd 2x 10, VC to maintain core throughout exercises. Underwater bicycle in horseback using large noodle for 10 min. Blue noodle single leg press 20x Bil.  ?06/17/21:  Aquatic Session: ?Pt arrives for aquatic physical therapy. Treatment took place in 3.5-5.5 feet of water. Water temperature was 92 degrees F. Pt entered the pool via stairs with mild use of rails. Pt requires buo

## 2021-07-04 NOTE — Therapy (Addendum)
OUTPATIENT PHYSICAL THERAPY TREATMENT   Patient Name: Danielle Bruce MRN: [id number] DOB:10/21/1960, 61 y.o., female Today's Date: 07/04/2021   PT End of Session - 07/04/21 1435     Visit Number 44    Date for PT Re-Evaluation 07/28/21    Authorization Type Medicare/Medicaid- KX needed at 15 for 2023    Progress Note Due on Visit 49    PT Start Time 1401    PT Stop Time 1434    PT Time Calculation (min) 33 min    Activity Tolerance Patient tolerated treatment well    Behavior During Therapy [facility] for tasks assessed/performed                     Past Medical History:  Diagnosis Date   Anemia    Anxiety    Arthritis    osteoarthritis   ASCUS (atypical squamous cells of undetermined significance) on Pap smear 05/06/2005   NEG HIGH RISK HPV--C&B BIOPSY BENIGN 12/2005   Asthma    Bipolar 2 disorder (HCC)    Cancer (HCC)    skin cancer - basal cell   Colon polyps    Complication of anesthesia    anxious afterwards, will get headaches    Constipation    Depression    Fibromyalgia 10/2013   GERD (gastroesophageal reflux disease)    Hearing loss on left    Heart murmur    never had any problems   Hemorrhoids    High cholesterol    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   cytology negative   IBS (irritable bowel syndrome)    Insomnia    Lymphocytic colitis    Lymphoma (HCC)    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   Bone marrow biopsy showes 8% plasma cells IgA Lambda   Migraines    Osteoarthritis    Osteopenia    Peripheral neuropathy    PTSD (post-traumatic stress disorder)    Past Surgical History:  Procedure Laterality Date   BONE MARROW BIOPSY Left 12/18/2013   Plasma cell dyscrasia 8% population of plasma cells   BREAST BIOPSY Right    benign stereo   CESAREAN SECTION  40,98,11   CHOLECYSTECTOMY N/A 10/16/2012   Procedure: LAPAROSCOPIC CHOLECYSTECTOMY;  Surgeon: Clovis Pu. Cornett, MD;  Location: [facility];  Service: General;   Laterality: N/A;   COLONOSCOPY     numerous times   DILATION AND CURETTAGE OF UTERUS     ESOPHAGOGASTRODUODENOSCOPY     HEMORRHOID SURGERY  1993   x3   IUD REMOVAL  02/2015   Mirena   LAPAROSCOPIC LYSIS OF ADHESIONS N/A 10/16/2012   Procedure: LAPAROSCOPIC LYSIS OF ADHESIONS;  Surgeon: Maisie Fus A. Cornett, MD;  Location: [facility];  Service: General;  Laterality: N/A;   LAPAROSCOPY N/A 10/16/2012   Procedure: LAPAROSCOPY DIAGNOSTIC;  Surgeon: Clovis Pu. Cornett, MD;  Location: [facility];  Service: General;  Laterality: N/A;   PELVIC LAPAROSCOPY     RADIOLOGY WITH ANESTHESIA N/A 12/16/2015   Procedure: MRI OF BRAIN WITH AND WITHOUT CONTRAST;  Surgeon: Medication Radiologist, MD;  Location: [facility];  Service: Radiology;  Laterality: N/A;   SHOULDER SURGERY  2007/2008   SPINE SURGERY  2010   cervical   Patient Active Problem List   Diagnosis Date Noted   GAD (generalized anxiety disorder) 12/26/2017   OCD (obsessive compulsive disorder) 12/26/2017   PTSD (post-traumatic stress disorder) 12/26/2017   DDD (degenerative disc disease), cervical 07/14/2016   Primary osteoarthritis of both  feet 07/14/2016   Primary osteoarthritis of both hands 07/14/2016   Other fatigue 07/14/2016   History of IBS 07/14/2016   Osteopenia of multiple sites 07/14/2016   Fibromyalgia 01/13/2016   MGUS (monoclonal gammopathy of unknown significance) 12/23/2013   Chronic cholecystitis without calculus 10/18/2012   Abdominal pain, unspecified site 10/18/2012   Nausea alone 10/18/2012   Unspecified constipation 10/18/2012   Depression 10/18/2012   Anxiety    ASCUS (atypical squamous cells of undetermined significance) on Pap smear    IUD    Hemorrhoids 11/29/2010   Abdominal pain, left upper quadrant 11/29/2010    PCP: Lupita Raider, MD  REFERRING PROVIDER: Lupita Raider, MD  REFERRING DIAG: Polymyalgia rheumatica  THERAPY DIAG:  Polymyalgia rheumatica (HCC)  Muscle weakness (generalized)  Cramp and  spasm  Abnormal posture  ONSET DATE: 07/29/19 (chronic)  SUBJECTIVE:                                                                                                                                                                                           SUBJECTIVE STATEMENT: I'm doing pretty good.  My neck is feeling stiff.  I am using a towel roll behind my neck when watching TV.   PERTINENT HISTORY:  Anxiety, bipolar, depression, fibromyalgia, IBS, migraines  PAIN:  Are you having pain? yes NPRS scale:4/10 Pain location: Lt neck Pain orientation: Lt PAIN TYPE: tight and irritated Pain description: Intermittent Aggravating factors: overdoing things with grandkids, too much activiity Relieving factors: pool exercises, stretching  PRECAUTIONS: Other: slow progression with exercise due to chronic condition  PATIENT GOALS be more active with less pain, exercise at the gym regularly   OBJECTIVE: 05/05/21   PATIENT SURVEYS:  NDI 42% limitation   POSTURE:  Forward head and rounded shoulders  PALPATION: Palpable tenderness and tension in neck and lumbar regions diffusely.   TODAY'S TREATMENT  Treatment on date: 07/04/21 Trigger Point Dry-Needling  Treatment instructions: Expect mild to moderate muscle soreness. S/S of pneumothorax if dry needled over a lung field, and to seek immediate medical attention should they occur. Patient verbalized understanding of these instructions and education.  Patient Consent Given: Yes Education handout provided: Previously provided Muscles treated: Bil oblique capitus, levator and upper traps  Treatment response/outcome: twitch response, increased tissue elongation   Skilled palpation and monitoring by PT during dry needling  Manual: elongation and release to bil neck and upper traps   06/28/21: Pt arrives for aquatic physical therapy. Treatment took place in 3.5-5.5 feet of water. Water temperature was 92 degrees F. Pt entered the pool via  stairs with mild use of the rails.  Pt  requires buoyancy of water for support and to offload joints with strengthening exercises.   Standing in 50%- 75% depth water pt performed water walking in all 4 directions 10x. VC for speed in order to generate appropriate current for resistance and/or UE movements. Hand paddles added for side stepping.   Wall exercises to inc: Hip 3 ways 20x ea with min support of pool wall. VC to push/pull against water with a force appropriate. Core contractions in partial wall squat with blue noodle 5 sec hold 10x. Bil heel raises 20x no UE support. High knee marching holding water weights under water 4x. Trunk rotation with kickboard 20x.Hand paddles for shoulder ext/flex 2x10, horizontal add/abd 2x 10, VC to maintain core throughout exercises. Underwater bicycle in horseback using large noodle for 10 min. Blue noodle single leg press 20x Bil.  06/17/21: Aquatic Session: Pt arrives for aquatic physical therapy. Treatment took place in 3.5-5.5 feet of water. Water temperature was 92 degrees F. Pt entered the pool via stairs with mild use of rails. Pt requires buoyancy of water for support and to offload joints with strengthening exercises. Pt utilizes viscosity of the water required for strengthening.   Standing in 50%- 75% depth water pt performed water walking in all 4 directions 10x. VC for speed in order to generate appropriate current for resistance and/or UE movements. Hand paddles added for side stepping and yellow noodle for push/pull. Wall exercises to inc: Hip 3 ways 20x ea with min support of pool wall. VC to push/pull against water with a force appropriate. Core contractions in partial wall squat with blue noodle 5 sec hold 10x. Bil heel raises 20x no UE support. High knee marching holding water weights under water 4x. Trunk rotation with kickboard 20x.Hand paddles for shoulder ext/flex 2x10, horizontal add/abd 2x 10, VC to maintain core throughout exercises. Underwater  bicycle in horseback using large noodle for 10 min. Blue noodle single leg press 10x Bil.   Treatment on date: 06/15/21 Manual to bil neck and upper traps and rhomboids Trigger Point Dry-Needling  Treatment instructions: Expect mild to moderate muscle soreness. S/S of pneumothorax if dry needled over a lung field, and to seek immediate medical attention should they occur. Patient verbalized understanding of these instructions and education.  Patient Consent Given: Yes Education handout provided: Previously provided Muscles treated: Bil oblique capitus, levator and upper traps  Treatment response/outcome: twitch and increased tissue length   Skilled palpation and monitoring by PT during dry needling    HOME EXERCISE PROGRAM: Access Code: [email or web address]  ASSESSMENT:  CLINICAL IMPRESSION: Pt arrived with low pain levels today.  She is doing well in the pool and is more active at home.  She is now doing more gardening and knows when to take a break so that she does not hurt as much after.  Pt is traveling to [place] this week and will have one aquatics session prior.  Pt with tension in bil neck, upper traps and suboccipitals and had good response to DN and manual therapy.  Pt will likely transition to gym exercises once she returns from her trip.    OBJECTIVE IMPAIRMENTS decreased endurance, decreased mobility, difficulty walking, decreased ROM, decreased strength, increased muscle spasms, improper body mechanics, postural dysfunction, and pain.   ACTIVITY LIMITATIONS cleaning, community activity, driving, meal prep, and yard work.   PERSONAL FACTORS Past/current experiences and 1-2 comorbidities: fibromyalgia, mental illness  are also affecting patient's functional outcome.    GOALS: Goals reviewed with patient? Yes  SHORT  TERM GOALS:  Initiate gym exercise program and report compliance 1x/wk Baseline: not doing this now Target date: 08/15/2021 Goal status: INITIAL  2.  Walk for exercise  3x/wk and verbalize safe progression Baseline: not walking now due to depression Target date: 08/15/2021 Goal status: INITIAL  LONG TERM GOALS:  be independent in advanced HEP with completion 4-5x/wk  Baseline: doing exercises only when things hurt Target date: 09/26/2021 Goal status: IN PROGRESS  2.  Pt will be able to walk at least 1 mile without increased limitation for ability to exercise and community function.  Baseline: has not been walking due to depression.  Not walking now.   Target date: 09/26/2021 Goal status: IN PROGRESS  3.  report 50% less neck/UE pain throughout the day with normal functional activities.  Baseline:  Target date: 09/26/2021 Goal status: INITIAL  4.  initiate a gym exercise program and participate > or = to 1x/wk  Baseline: going to the pool 1x for aquatics.  Will join gym soon  Target date: 09/26/2021 Goal status: IN PROGRESS  5.  Pt will demonstrate at least hip strength of 4+/5 for  improved stability for walking and lifting her grandchildren  Baseline:  Target date: 09/26/2021 Goal status: IN PROGRESS  6.  improve NDI to < or = to 30% disability  Baseline: 42 Target date: 09/26/2021 Goal status: IN PROGRESS  PLAN: PT FREQUENCY: 1-2x/week  PT DURATION: 12 weeks  PLANNED INTERVENTIONS: Therapeutic exercises, Therapeutic activity, Neuromuscular re-education, Balance training, Gait training, Patient/Family education, Joint mobilization, Aquatic Therapy, Dry Needling, Electrical stimulation, Cryotherapy, Moist heat, Taping, and Manual therapy  PLAN FOR NEXT SESSION: One more aquatics sessions and then will be on a trip and will resume PT when she returns.  ERO 07/28/21  Lorrene Reid, PT 07/04/21 2:41 PM

## 2021-07-06 ENCOUNTER — Other Ambulatory Visit: Payer: Self-pay | Admitting: Psychiatry

## 2021-07-06 ENCOUNTER — Ambulatory Visit: Payer: Medicare Other | Admitting: Physical Therapy

## 2021-07-06 ENCOUNTER — Encounter: Payer: Self-pay | Admitting: Physical Therapy

## 2021-07-06 ENCOUNTER — Telehealth: Payer: Self-pay | Admitting: Psychiatry

## 2021-07-06 DIAGNOSIS — M353 Polymyalgia rheumatica: Secondary | ICD-10-CM | POA: Diagnosis not present

## 2021-07-06 DIAGNOSIS — F5081 Binge eating disorder: Secondary | ICD-10-CM

## 2021-07-06 DIAGNOSIS — M6281 Muscle weakness (generalized): Secondary | ICD-10-CM | POA: Diagnosis not present

## 2021-07-06 DIAGNOSIS — R252 Cramp and spasm: Secondary | ICD-10-CM | POA: Diagnosis not present

## 2021-07-06 DIAGNOSIS — R293 Abnormal posture: Secondary | ICD-10-CM | POA: Diagnosis not present

## 2021-07-06 DIAGNOSIS — F902 Attention-deficit hyperactivity disorder, combined type: Secondary | ICD-10-CM

## 2021-07-06 MED ORDER — LISDEXAMFETAMINE DIMESYLATE 40 MG PO CAPS: 40.0000 mg | ORAL_CAPSULE | ORAL | 0 refills | Status: DC

## 2021-07-06 MED ORDER — LISDEXAMFETAMINE DIMESYLATE 40 MG PO CAPS: 40.0000 mg | ORAL_CAPSULE | Freq: Every day | ORAL | 0 refills | Status: DC

## 2021-07-06 NOTE — Telephone Encounter (Signed)
Pt lvm at 7:43 am  asking for a refill on her vyvanse 40 mg. She is going out of town today and needs asap. [facility] is [facility] on [address] ?

## 2021-07-21 NOTE — Progress Notes (Deleted)
Office Visit Note  Patient: Danielle Bruce             Date of Birth: 1960-05-06           MRN: [id number]             PCP: Mayra Neer, MD Referring: Mayra Neer, MD Visit Date: 08/04/2021 Occupation: _0 @  Subjective:  No chief complaint on file.   History of Present Illness: Danielle Bruce is a 61 y.o. female ***   Activities of Daily Living:  Patient reports morning stiffness for *** {minute/hour:19697}.   Patient {ACTIONS;DENIES/REPORTS:21021675::"Denies"} nocturnal pain.  Difficulty dressing/grooming: {ACTIONS;DENIES/REPORTS:21021675::"Denies"} Difficulty climbing stairs: {ACTIONS;DENIES/REPORTS:21021675::"Denies"} Difficulty getting out of chair: {ACTIONS;DENIES/REPORTS:21021675::"Denies"} Difficulty using hands for taps, buttons, cutlery, and/or writing: {ACTIONS;DENIES/REPORTS:21021675::"Denies"}  No Rheumatology ROS completed.   PMFS History:  Patient Active Problem List   Diagnosis Date Noted   GAD (generalized anxiety disorder) 12/26/2017   OCD (obsessive compulsive disorder) 12/26/2017   PTSD (post-traumatic stress disorder) 12/26/2017   DDD (degenerative disc disease), cervical 07/14/2016   Primary osteoarthritis of both feet 07/14/2016   Primary osteoarthritis of both hands 07/14/2016   Other fatigue 07/14/2016   History of IBS 07/14/2016   Osteopenia of multiple sites 07/14/2016   Fibromyalgia 01/13/2016   MGUS (monoclonal gammopathy of unknown significance) 12/23/2013   Chronic cholecystitis without calculus 10/18/2012   Abdominal pain, unspecified site 10/18/2012   Nausea alone 10/18/2012   Unspecified constipation 10/18/2012   Depression 10/18/2012   Anxiety    ASCUS (atypical squamous cells of undetermined significance) on Pap smear    IUD    Hemorrhoids 11/29/2010   Abdominal pain, left upper quadrant 11/29/2010    Past Medical History:  Diagnosis Date   Anemia    Anxiety    Arthritis    osteoarthritis   ASCUS (atypical  squamous cells of undetermined significance) on Pap smear 05/06/2005   NEG HIGH RISK HPV--C&B BIOPSY BENIGN 12/2005   Asthma    Bipolar 2 disorder ([place])    Cancer ([place])    skin cancer - basal cell   Colon polyps    Complication of anesthesia    anxious afterwards, will get headaches    Constipation    Depression    Fibromyalgia 10/2013   GERD (gastroesophageal reflux disease)    Hearing loss on left    Heart murmur    never had any problems   Hemorrhoids    High cholesterol    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   cytology negative   IBS (irritable bowel syndrome)    Insomnia    Lymphocytic colitis    Lymphoma (HCC)    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   Bone marrow biopsy showes 8% plasma cells IgA Lambda   Migraines    Osteoarthritis    Osteopenia    Peripheral neuropathy    PTSD (post-traumatic stress disorder)     Family History  Problem Relation Age of Onset   Diabetes Father    Hypertension Father    Hyperlipidemia Father    Depression Daughter    Anxiety disorder Daughter    Asthma Daughter    Heart disease Maternal Grandfather    Heart disease Paternal Grandmother    Heart disease Paternal Grandfather    Depression Son    Depression Son    Anxiety disorder Son    Breast cancer Neg Hx    Past Surgical History:  Procedure Laterality Date   BONE MARROW BIOPSY Left 12/18/2013  Plasma cell dyscrasia 8% population of plasma cells   BREAST BIOPSY Right    benign stereo   CESAREAN SECTION  32,12,24   CHOLECYSTECTOMY N/A 10/16/2012   Procedure: LAPAROSCOPIC CHOLECYSTECTOMY;  Surgeon: Joyice Faster. Cornett, MD;  Location: [facility];  Service: General;  Laterality: N/A;   COLONOSCOPY     numerous times   DILATION AND CURETTAGE OF UTERUS     ESOPHAGOGASTRODUODENOSCOPY     HEMORRHOID SURGERY  1993   x3   IUD REMOVAL  02/2015   Mirena   LAPAROSCOPIC LYSIS OF ADHESIONS N/A 10/16/2012   Procedure: LAPAROSCOPIC LYSIS OF ADHESIONS;  Surgeon:  Marcello Moores A. Cornett, MD;  Location: [facility];  Service: General;  Laterality: N/A;   LAPAROSCOPY N/A 10/16/2012   Procedure: LAPAROSCOPY DIAGNOSTIC;  Surgeon: Joyice Faster. Cornett, MD;  Location: [facility];  Service: General;  Laterality: N/A;   PELVIC LAPAROSCOPY     RADIOLOGY WITH ANESTHESIA N/A 12/16/2015   Procedure: MRI OF BRAIN WITH AND WITHOUT CONTRAST;  Surgeon: Medication Radiologist, MD;  Location: [facility];  Service: Radiology;  Laterality: N/A;   SHOULDER SURGERY  2007/2008   SPINE SURGERY  2010   cervical   Social History   Social History Narrative   Not on file   Immunization History  Administered Date(s) Administered   Influenza-Unspecified 11/20/2013   PFIZER(Purple Top)SARS-COV-2 Vaccination 05/15/2019, 06/09/2019, 02/11/2020     Objective: Vital Signs: LMP 12/28/2010    Physical Exam   Musculoskeletal Exam: ***  CDAI Exam: CDAI Score: -- Patient Global: --; Provider Global: -- Swollen: --; Tender: -- Joint Exam 08/04/2021   No joint exam has been documented for this visit   There is currently no information documented on the homunculus. Go to the Rheumatology activity and complete the homunculus joint exam.  Investigation: No additional findings.  Imaging: No results found.  Recent Labs: Lab Results  Component Value Date   WBC 5.9 08/01/2016   HGB 14.6 08/01/2016   PLT 254 08/01/2016   NA 142 08/01/2016   K 4.7 08/01/2016   CL 107 08/01/2016   CO2 23 08/01/2016   GLUCOSE 95 08/01/2016   BUN 14 08/01/2016   CREATININE 0.87 08/01/2016   BILITOT 0.7 08/01/2016   ALKPHOS 55 08/01/2016   AST 23 08/01/2016   ALT 26 08/01/2016   PROT 7.4 08/01/2016   ALBUMIN 4.8 08/01/2016   CALCIUM 10.0 08/01/2016   GFRAA 88 08/01/2016    Speciality Comments: No specialty comments available.  Procedures:  No procedures performed Allergies: Sulfa antibiotics, Gluten meal, Lactose intolerance (gi), Aspirin, Bromfed dm [pseudoeph-bromphen-dm], Chlorpromazine,  Desipramine, Gabapentin, Hydrocodone, Lamotrigine, Metronidazole, Morphine sulfate, Nsaids, Flagyl [metronidazole hcl], and Morphine and related   Assessment / Plan:     Visit Diagnoses: Fibromyalgia  Other fatigue  Other insomnia  Primary osteoarthritis of both hands  Primary osteoarthritis of both feet  Trochanteric bursitis of both hips  Trapezius muscle spasm  DDD (degenerative disc disease), cervical  DDD (degenerative disc disease), lumbar  Osteopenia of multiple sites  Chronic cholecystitis without calculus  History of posttraumatic stress disorder (PTSD)  Lymphocytic colitis  History of IBS  Bipolar 2 disorder (HCC)  MGUS (monoclonal gammopathy of unknown significance)  Orders: No orders of the defined types were placed in this encounter.  No orders of the defined types were placed in this encounter.   Face-to-face time spent with patient was *** minutes. Greater than 50% of time was spent in counseling and coordination of care.  Follow-Up Instructions: No  follow-ups on file.   Ofilia Neas, PA-C  Note - This record has been created using Dragon software.  Chart creation errors have been sought, but may not always  have been located. Such creation errors do not reflect on  the standard of medical care.

## 2021-07-26 ENCOUNTER — Ambulatory Visit: Payer: Medicare Other | Admitting: Addiction (Substance Use Disorder)

## 2021-07-26 NOTE — Therapy (Unsigned)
OUTPATIENT PHYSICAL THERAPY TREATMENT   Patient Name: Danielle Bruce MRN: [id number] DOB:02-13-1961, 61 y.o., female Today's Date: 07/27/2021   PT End of Session - 07/27/21 1100     Visit Number 51    Date for PT Re-Evaluation 07/28/21    Authorization Type Medicare/Medicaid- KX needed at 64 for 2023    Progress Note Due on Visit 32    PT Start Time 1059    PT Stop Time 1154    PT Time Calculation (min) 55 min    Activity Tolerance Patient tolerated treatment well    Behavior During Therapy [facility] for tasks assessed/performed                       Past Medical History:  Diagnosis Date   Anemia    Anxiety    Arthritis    osteoarthritis   ASCUS (atypical squamous cells of undetermined significance) on Pap smear 05/06/2005   NEG HIGH RISK HPV--C&B BIOPSY BENIGN 12/2005   Asthma    Bipolar 2 disorder ([place])    Cancer ([place])    skin cancer - basal cell   Colon polyps    Complication of anesthesia    anxious afterwards, will get headaches    Constipation    Depression    Fibromyalgia 10/2013   GERD (gastroesophageal reflux disease)    Hearing loss on left    Heart murmur    never had any problems   Hemorrhoids    High cholesterol    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   cytology negative   IBS (irritable bowel syndrome)    Insomnia    Lymphocytic colitis    Lymphoma (HCC)    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   Bone marrow biopsy showes 8% plasma cells IgA Lambda   Migraines    Osteoarthritis    Osteopenia    Peripheral neuropathy    PTSD (post-traumatic stress disorder)    Past Surgical History:  Procedure Laterality Date   BONE MARROW BIOPSY Left 12/18/2013   Plasma cell dyscrasia 8% population of plasma cells   BREAST BIOPSY Right    benign stereo   CESAREAN SECTION  40,98,11   CHOLECYSTECTOMY N/A 10/16/2012   Procedure: LAPAROSCOPIC CHOLECYSTECTOMY;  Surgeon: Joyice Faster. Cornett, MD;  Location: [facility];  Service: General;   Laterality: N/A;   COLONOSCOPY     numerous times   DILATION AND CURETTAGE OF UTERUS     ESOPHAGOGASTRODUODENOSCOPY     HEMORRHOID SURGERY  1993   x3   IUD REMOVAL  02/2015   Mirena   LAPAROSCOPIC LYSIS OF ADHESIONS N/A 10/16/2012   Procedure: LAPAROSCOPIC LYSIS OF ADHESIONS;  Surgeon: Marcello Moores A. Cornett, MD;  Location: [facility];  Service: General;  Laterality: N/A;   LAPAROSCOPY N/A 10/16/2012   Procedure: LAPAROSCOPY DIAGNOSTIC;  Surgeon: Joyice Faster. Cornett, MD;  Location[facility];  Service: General;  Laterality: N/A;   PELVIC LAPAROSCOPY     RADIOLOGY WITH ANESTHESIA N/A 12/16/2015   Procedure: MRI OF BRAIN WITH AND WITHOUT CONTRAST;  Surgeon: Medication Radiologist, MD;  Location: [place];  Service: Radiology;  Laterality: N/A;   SHOULDER SURGERY  2007/2008   SPINE SURGERY  2010   cervical   Patient Active Problem List   Diagnosis Date Noted   GAD (generalized anxiety disorder) 12/26/2017   OCD (obsessive compulsive disorder) 12/26/2017   PTSD (post-traumatic stress disorder) 12/26/2017   DDD (degenerative disc disease), cervical 07/14/2016   Primary osteoarthritis  of both feet 07/14/2016   Primary osteoarthritis of both hands 07/14/2016   Other fatigue 07/14/2016   History of IBS 07/14/2016   Osteopenia of multiple sites 07/14/2016   Fibromyalgia 01/13/2016   MGUS (monoclonal gammopathy of unknown significance) 12/23/2013   Chronic cholecystitis without calculus 10/18/2012   Abdominal pain, unspecified site 10/18/2012   Nausea alone 10/18/2012   Unspecified constipation 10/18/2012   Depression 10/18/2012   Anxiety    ASCUS (atypical squamous cells of undetermined significance) on Pap smear    IUD    Hemorrhoids 11/29/2010   Abdominal pain, left upper quadrant 11/29/2010    PCP: Mayra Neer, MD  REFERRING PROVIDER: Mayra Neer, MD  REFERRING DIAG: Polymyalgia rheumatica  THERAPY DIAG:  Polymyalgia rheumatica ([place])  Muscle weakness (generalized)  Cramp and  spasm  Abnormal posture  ONSET DATE: 07/29/19 (chronic)  SUBJECTIVE:                                                                                                                                                                                           SUBJECTIVE STATEMENT: I had a great trip to [place]. My hips and feet did very well. I only had 1 pajama day! PERTINENT HISTORY:  Anxiety, bipolar, depression, fibromyalgia, IBS, migraines  PAIN:  Are you having pain? Not right now NPRS scale: Pain location:  Pain orientation: Lt PAIN TYPE:  Pain description: Intermittent Aggravating factors: overdoing things with grandkids, too much activiity Relieving factors: pool exercises, stretching  PRECAUTIONS: Other: slow progression with exercise due to chronic condition  PATIENT GOALS be more active with less pain, exercise at the gym regularly   OBJECTIVE: 05/05/21   PATIENT SURVEYS:  NDI 42% limitation   POSTURE:  Forward head and rounded shoulders  PALPATION: Palpable tenderness and tension in neck and lumbar regions diffusely.   TODAY'S TREATMENT   07/27/21: Pt arrives for aquatic physical therapy. Treatment took place in 3.5-5.5 feet of water. Water temperature was 92 degrees F. Pt entered the pool via stairs with mild use of the rails.  Pt requires buoyancy of water for support and to offload joints with strengthening exercises.  Pt utilizes viscosity of the water required for strengthening.  Standing in 50%- 75% depth water pt performed water walking in all 4 directions 10x. VC for speed in order to generate appropriate current for resistance and/or UE movements. Hand paddles added for side stepping.   Wall exercises to inc: Hip 3 ways 20x ea with min support of pool wall. VC to push/pull against water with a force appropriate. Core contractions in partial wall squat with blue noodle 5 sec hold 10x.  Bil heel raises 20x no UE support. High knee marching holding water weights  under water 4x. Trunk rotation with kickboard 20x.Hand paddles for shoulder ext/flex 2x10, horizontal add/abd 2x 10, VC to maintain core throughout exercises. Underwater bicycle in horseback using large noodle for 10 min. Blue noodle single leg press 20x Bil.   07/06/21: Pt arrives for aquatic physical therapy. Treatment took place in 3.5-5.5 feet of water. Water temperature was 92 degrees F. Pt entered the pool via stairs with mild use of the rails.  Pt requires buoyancy of water for support and to offload joints with strengthening exercises.  Pt utilizes viscosity of the water required for strengthening.  Standing in 50%- 75% depth water pt performed water walking in all 4 directions 10x. VC for speed in order to generate appropriate current for resistance and/or UE movements. Hand paddles added for side stepping.   Wall exercises to inc: Hip 3 ways 20x ea with min support of pool wall. VC to push/pull against water with a force appropriate. Core contractions in partial wall squat with blue noodle 5 sec hold 10x. Bil heel raises 20x no UE support. High knee marching holding water weights under water 4x. Trunk rotation with kickboard 20x.Hand paddles for shoulder ext/flex 2x10, horizontal add/abd 2x 10, VC to maintain core throughout exercises. Underwater bicycle in horseback using large noodle for 10 min. Blue noodle single leg press 20x Bil.   Treatment on date: 07/04/21 Trigger Point Dry-Needling  Treatment instructions: Expect mild to moderate muscle soreness. S/S of pneumothorax if dry needled over a lung field, and to seek immediate medical attention should they occur. Patient verbalized understanding of these instructions and education.  Patient Consent Given: Yes Education handout provided: Previously provided Muscles treated: Bil oblique capitus, levator and upper traps  Treatment response/outcome: twitch response, increased tissue elongation   Skilled palpation and monitoring by PT during dry  needling  Manual: elongation and release to bil neck and upper traps   06/28/21: Pt arrives for aquatic physical therapy. Treatment took place in 3.5-5.5 feet of water. Water temperature was 92 degrees F. Pt entered the pool via stairs with mild use of the rails.  Pt requires buoyancy of water for support and to offload joints with strengthening exercises.   Standing in 50%- 75% depth water pt performed water walking in all 4 directions 10x. VC for speed in order to generate appropriate current for resistance and/or UE movements. Hand paddles added for side stepping.   Wall exercises to inc: Hip 3 ways 20x ea with min support of pool wall. VC to push/pull against water with a force appropriate. Core contractions in partial wall squat with blue noodle 5 sec hold 10x. Bil heel raises 20x no UE support. High knee marching holding water weights under water 4x. Trunk rotation with kickboard 20x.Hand paddles for shoulder ext/flex 2x10, horizontal add/abd 2x 10, VC to maintain core throughout exercises. Underwater bicycle in horseback using large noodle for 10 min. Blue noodle single leg press 20x Bil.  06/17/21: Aquatic Session: Pt arrives for aquatic physical therapy. Treatment took place in 3.5-5.5 feet of water. Water temperature was 92 degrees F. Pt entered the pool via stairs with mild use of rails. Pt requires buoyancy of water for support and to offload joints with strengthening exercises. Pt utilizes viscosity of the water required for strengthening.   Standing in 50%- 75% depth water pt performed water walking in all 4 directions 10x. VC for speed in order to generate appropriate current  for resistance and/or UE movements. Hand paddles added for side stepping and yellow noodle for push/pull. Wall exercises to inc: Hip 3 ways 20x ea with min support of pool wall. VC to push/pull against water with a force appropriate. Core contractions in partial wall squat with blue noodle 5 sec hold 10x. Bil heel raises  20x no UE support. High knee marching holding water weights under water 4x. Trunk rotation with kickboard 20x.Hand paddles for shoulder ext/flex 2x10, horizontal add/abd 2x 10, VC to maintain core throughout exercises. Underwater bicycle in horseback using large noodle for 10 min. Blue noodle single leg press 10x Bil.   Treatment on date: 06/15/21 Manual to bil neck and upper traps and rhomboids Trigger Point Dry-Needling  Treatment instructions: Expect mild to moderate muscle soreness. S/S of pneumothorax if dry needled over a lung field, and to seek immediate medical attention should they occur. Patient verbalized understanding of these instructions and education.  Patient Consent Given: Yes Education handout provided: Previously provided Muscles treated: Bil oblique capitus, levator and upper traps  Treatment response/outcome: twitch and increased tissue length   Skilled palpation and monitoring by PT during dry needling    HOME EXERCISE PROGRAM: Access Code: [email or web address]  ASSESSMENT:  CLINICAL IMPRESSION: Pt is independent in her aquatic program. Pt may need printouts at her last PT land session. Pt plans to continue at [facility] and do a combination of aquatic exercise and land/weightbearing exercises.   OBJECTIVE IMPAIRMENTS decreased endurance, decreased mobility, difficulty walking, decreased ROM, decreased strength, increased muscle spasms, improper body mechanics, postural dysfunction, and pain.   ACTIVITY LIMITATIONS cleaning, community activity, driving, meal prep, and yard work.   PERSONAL FACTORS Past/current experiences and 1-2 comorbidities: fibromyalgia, mental illness  are also affecting patient's functional outcome.    GOALS: Goals reviewed with patient? Yes  SHORT TERM GOALS:  Initiate gym exercise program and report compliance 1x/wk Baseline: not doing this now Target date: 09/07/2021 Goal status: INITIAL  2.  Walk for exercise 3x/wk and verbalize safe  progression Baseline: not walking now due to depression Target date: 09/07/2021 Goal status: INITIAL  LONG TERM GOALS:  be independent in advanced HEP with completion 4-5x/wk  Baseline: doing exercises only when things hurt Target date: 10/19/2021 Goal status: IN PROGRESS  2.  Pt will be able to walk at least 1 mile without increased limitation for ability to exercise and community function.  Baseline: has not been walking due to depression.  Not walking now.   Target date: 10/19/2021 Goal status: IN PROGRESS  3.  report 50% less neck/UE pain throughout the day with normal functional activities.  Baseline:  Target date: 10/19/2021 Goal status: INITIAL  4.  initiate a gym exercise program and participate > or = to 1x/wk  Baseline: going to the pool 1x for aquatics.  Will join gym soon  Target date: 10/19/2021 Goal status: IN PROGRESS  5.  Pt will demonstrate at least hip strength of 4+/5 for  improved stability for walking and lifting her grandchildren  Baseline:  Target date: 10/19/2021 Goal status: IN PROGRESS  6.  improve NDI to < or = to 30% disability  Baseline: 42 Target date: 10/19/2021 Goal status: IN PROGRESS  PLAN: PT FREQUENCY: 1-2x/week  PT DURATION: 12 weeks  PLANNED INTERVENTIONS: Therapeutic exercises, Therapeutic activity, Neuromuscular re-education, Balance training, Gait training, Patient/Family education, Joint mobilization, Aquatic Therapy, Dry Needling, Electrical stimulation, Cryotherapy, Moist heat, Taping, and Manual therapy  PLAN FOR NEXT SESSION:  ERO 07/28/21  Myrene Galas, PTA  07/27/21 11:55 AM  07/27/21 11:55 AM

## 2021-07-27 ENCOUNTER — Ambulatory Visit: Payer: Medicare Other | Admitting: Physical Therapy

## 2021-07-27 ENCOUNTER — Encounter: Payer: Self-pay | Admitting: Physical Therapy

## 2021-07-27 DIAGNOSIS — R293 Abnormal posture: Secondary | ICD-10-CM

## 2021-07-27 DIAGNOSIS — R252 Cramp and spasm: Secondary | ICD-10-CM | POA: Diagnosis not present

## 2021-07-27 DIAGNOSIS — M6281 Muscle weakness (generalized): Secondary | ICD-10-CM

## 2021-07-27 DIAGNOSIS — M5430 Sciatica, unspecified side: Secondary | ICD-10-CM | POA: Diagnosis not present

## 2021-07-27 DIAGNOSIS — K589 Irritable bowel syndrome without diarrhea: Secondary | ICD-10-CM | POA: Diagnosis not present

## 2021-07-27 DIAGNOSIS — M353 Polymyalgia rheumatica: Secondary | ICD-10-CM | POA: Diagnosis not present

## 2021-07-27 DIAGNOSIS — F900 Attention-deficit hyperactivity disorder, predominantly inattentive type: Secondary | ICD-10-CM | POA: Diagnosis not present

## 2021-07-27 DIAGNOSIS — R0789 Other chest pain: Secondary | ICD-10-CM | POA: Diagnosis not present

## 2021-07-27 DIAGNOSIS — F411 Generalized anxiety disorder: Secondary | ICD-10-CM | POA: Diagnosis not present

## 2021-07-27 DIAGNOSIS — G43909 Migraine, unspecified, not intractable, without status migrainosus: Secondary | ICD-10-CM | POA: Diagnosis not present

## 2021-07-27 DIAGNOSIS — M797 Fibromyalgia: Secondary | ICD-10-CM | POA: Diagnosis not present

## 2021-07-28 ENCOUNTER — Ambulatory Visit: Payer: Medicare Other | Attending: Family Medicine

## 2021-07-28 DIAGNOSIS — M353 Polymyalgia rheumatica: Secondary | ICD-10-CM | POA: Diagnosis not present

## 2021-07-28 DIAGNOSIS — R293 Abnormal posture: Secondary | ICD-10-CM

## 2021-07-28 DIAGNOSIS — R252 Cramp and spasm: Secondary | ICD-10-CM | POA: Diagnosis not present

## 2021-07-28 DIAGNOSIS — M6281 Muscle weakness (generalized): Secondary | ICD-10-CM

## 2021-07-28 NOTE — Therapy (Signed)
OUTPATIENT PHYSICAL THERAPY TREATMENT   Patient Name: Danielle Bruce MRN: [id number] DOB:[birth year], 61 y.o., female Today's Date: 07/28/2021   PT End of Session - 07/28/21 1211     Visit Number 71    PT Start Time 4627    PT Stop Time 1222    PT Time Calculation (min) 35 min    Activity Tolerance Patient tolerated treatment well    Behavior During Therapy [facility] for tasks assessed/performed                        Past Medical History:  Diagnosis Date   Anemia    Anxiety    Arthritis    osteoarthritis   ASCUS (atypical squamous cells of undetermined significance) on Pap smear 05/06/2005   NEG HIGH RISK HPV--C&B BIOPSY BENIGN 12/2005   Asthma    Bipolar 2 disorder ([place])    Cancer ([place])    skin cancer - basal cell   Colon polyps    Complication of anesthesia    anxious afterwards, will get headaches    Constipation    Depression    Fibromyalgia 10/2013   GERD (gastroesophageal reflux disease)    Hearing loss on left    Heart murmur    never had any problems   Hemorrhoids    High cholesterol    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   cytology negative   IBS (irritable bowel syndrome)    Insomnia    Lymphocytic colitis    Lymphoma (HCC)    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   Bone marrow biopsy showes 8% plasma cells IgA Lambda   Migraines    Osteoarthritis    Osteopenia    Peripheral neuropathy    PTSD (post-traumatic stress disorder)    Past Surgical History:  Procedure Laterality Date   BONE MARROW BIOPSY Left 12/18/2013   Plasma cell dyscrasia 8% population of plasma cells   BREAST BIOPSY Right    benign stereo   CESAREAN SECTION  03,50,09   CHOLECYSTECTOMY N/A 10/16/2012   Procedure: LAPAROSCOPIC CHOLECYSTECTOMY;  Surgeon: Joyice Faster. Cornett, MD;  [facility];  Service: General;  Laterality: N/A;   COLONOSCOPY     numerous times   DILATION AND CURETTAGE OF UTERUS     ESOPHAGOGASTRODUODENOSCOPY     HEMORRHOID  SURGERY  1993   x3   IUD REMOVAL  02/2015   Mirena   LAPAROSCOPIC LYSIS OF ADHESIONS N/A 10/16/2012   Procedure: LAPAROSCOPIC LYSIS OF ADHESIONS;  Surgeon: Marcello Moores A. Cornett, MD;  [facility];  Service: General;  Laterality: N/A;   LAPAROSCOPY N/A 10/16/2012   Procedure: LAPAROSCOPY DIAGNOSTIC;  Surgeon: Joyice Faster. Cornett, MD;  [facility];  Service: General;  Laterality: N/A;   PELVIC LAPAROSCOPY     RADIOLOGY WITH ANESTHESIA N/A 12/16/2015   Procedure: MRI OF BRAIN WITH AND WITHOUT CONTRAST;  Surgeon: Medication Radiologist, MD;  Location: [place];  Service: Radiology;  Laterality: N/A;   SHOULDER SURGERY  2007/2008   SPINE SURGERY  2010   cervical   Patient Active Problem List   Diagnosis Date Noted   GAD (generalized anxiety disorder) 12/26/2017   OCD (obsessive compulsive disorder) 12/26/2017   PTSD (post-traumatic stress disorder) 12/26/2017   DDD (degenerative disc disease), cervical 07/14/2016   Primary osteoarthritis of both feet 07/14/2016   Primary osteoarthritis of both hands 07/14/2016   Other fatigue 07/14/2016   History of IBS 07/14/2016   Osteopenia of multiple  sites 07/14/2016   Fibromyalgia 01/13/2016   MGUS (monoclonal gammopathy of unknown significance) 12/23/2013   Chronic cholecystitis without calculus 10/18/2012   Abdominal pain, unspecified site 10/18/2012   Nausea alone 10/18/2012   Unspecified constipation 10/18/2012   Depression 10/18/2012   Anxiety    ASCUS (atypical squamous cells of undetermined significance) on Pap smear    IUD    Hemorrhoids 11/29/2010   Abdominal pain, left upper quadrant 11/29/2010    PCP: Mayra Neer, MD  REFERRING PROVIDER: Mayra Neer, MD  REFERRING DIAG: Polymyalgia rheumatica  THERAPY DIAG:  Polymyalgia rheumatica ([place])  Muscle weakness (generalized)  Cramp and spasm  Abnormal posture  ONSET DATE: 07/29/19 (chronic)  SUBJECTIVE:                                                                                                                                                                                            SUBJECTIVE STATEMENT: I had a great trip to [place]. I was so happy to be back in the pool yesterday.  I saw Dr Brigitte Pulse yesterday.   PERTINENT HISTORY:  Anxiety, bipolar, depression, fibromyalgia, IBS, migraines  PAIN:  Are you having pain? Not right now NPRS scale:  4/10 Pain location:  Rt LE, neck Pain description: Intermittent Aggravating factors: overdoing things with grandkids, too much activiity Relieving factors: pool exercises, stretching  PRECAUTIONS: Other: slow progression with exercise due to chronic condition  PATIENT GOALS be more active with less pain, exercise at the gym regularly   OBJECTIVE: 05/05/21   PATIENT SURVEYS:  NDI 42% limitation  07/28/21: 40     POSTURE:  Forward head and rounded shoulders  PALPATION: 07/28/21: palpable tenderness over Rt SI joint and gluteals  6/1/23Hip strength: 4-/5 hip flexion, 4/5 abduction, extension 4-/5   TODAY'S TREATMENT  07/28/21:  Discussion regarding how to progress gym and aquatics after D/C Single knee to chest, double knee to chest, figure 4 3x20 seconds  Sidelying clam x15 each Seated hamstring stretch 3x20 seconds   07/27/21: Pt arrives for aquatic physical therapy. Treatment took place in 3.5-5.5 feet of water. Water temperature was 92 degrees F. Pt entered the pool via stairs with mild use of the rails.  Pt requires buoyancy of water for support and to offload joints with strengthening exercises.  Pt utilizes viscosity of the water required for strengthening.  Standing in 50%- 75% depth water pt performed water walking in all 4 directions 10x. VC for speed in order to generate appropriate current for resistance and/or UE movements. Hand paddles added for side stepping.   Wall exercises to inc: Hip 3 ways 20x ea with min  support of pool wall. VC to push/pull against water with a force appropriate. Core  contractions in partial wall squat with blue noodle 5 sec hold 10x. Bil heel raises 20x no UE support. High knee marching holding water weights under water 4x. Trunk rotation with kickboard 20x.Hand paddles for shoulder ext/flex 2x10, horizontal add/abd 2x 10, VC to maintain core throughout exercises. Underwater bicycle in horseback using large noodle for 10 min. Blue noodle single leg press 20x Bil.   07/06/21: Pt arrives for aquatic physical therapy. Treatment took place in 3.5-5.5 feet of water. Water temperature was 92 degrees F. Pt entered the pool via stairs with mild use of the rails.  Pt requires buoyancy of water for support and to offload joints with strengthening exercises.  Pt utilizes viscosity of the water required for strengthening.  Standing in 50%- 75% depth water pt performed water walking in all 4 directions 10x. VC for speed in order to generate appropriate current for resistance and/or UE movements. Hand paddles added for side stepping.   Wall exercises to inc: Hip 3 ways 20x ea with min support of pool wall. VC to push/pull against water with a force appropriate. Core contractions in partial wall squat with blue noodle 5 sec hold 10x. Bil heel raises 20x no UE support. High knee marching holding water weights under water 4x. Trunk rotation with kickboard 20x.Hand paddles for shoulder ext/flex 2x10, horizontal add/abd 2x 10, VC to maintain core throughout exercises. Underwater bicycle in horseback using large noodle for 10 min. Blue noodle single leg press 20x Bil.   Treatment on date: 07/04/21 Trigger Point Dry-Needling  Treatment instructions: Expect mild to moderate muscle soreness. S/S of pneumothorax if dry needled over a lung field, and to seek immediate medical attention should they occur. Patient verbalized understanding of these instructions and education.  Patient Consent Given: Yes Education handout provided: Previously provided Muscles treated: Bil oblique capitus, levator  and upper traps  Treatment response/outcome: twitch response, increased tissue elongation  Skilled palpation and monitoring by PT during dry needling  Manual: elongation and release to bil neck and upper traps  HOME EXERCISE PROGRAM: Access Code: [email or web address]  ASSESSMENT:  CLINICAL IMPRESSION: Pt reports Rt sciatic pain that began after she fell on her trip.  Overall pt is feeling good and was able to travel to [place] and only take 1 rest day while on the trip.  Pt is independent in HEP and gym/aquatic exercise and will be doing this independently.  Session today focused on stretches for her Rt gluteals to address pain from fall.  Pt with palpable tenderness over Rt SI joint and gluteals.  Pt with reduced symptoms after this. We discussed HEP and how to progress after D/C   GOALS: Goals reviewed with patient? Yes  SHORT TERM GOALS:  Initiate gym exercise program and report compliance 1x/wk  Goal status: MET  2.  Walk for exercise 3x/wk and verbalize safe progression  Goal status: MET  LONG TERM GOALS:  be independent in advanced HEP with completion 4-5x/wk   Goal status: Partially met  2.  Pt will be able to walk at least 1 mile without increased limitation for ability to exercise and community function.  Baseline: has not been walking due to depression.  Not walking now.    Goal status: MET   3.  report 50% less neck/UE pain throughout the day with normal functional activities.  Baseline:  Target date: 10/20/2021 Goal status: MET  4.  initiate a gym exercise  program and participate > or = to 1x/wk  Baseline: going to the pool 1x for aquatics.  Will join gym soon  Target date: 10/20/2021 Goal status: Will join next week  5.  Pt will demonstrate at least hip strength of 4+/5 for  improved stability for walking and lifting her grandchildren  Baseline:    Goal status: IN PROGRESS  6.  improve NDI to < or = to 30% disability  Baseline: 40 Partially met  PLAN: D/C PT to  HEP and pt will begin with gym and pool exercises.  PHYSICAL THERAPY DISCHARGE SUMMARY  Visits from Start of Care: 47  Current functional level related to goals / functional outcomes: See above for current status.    Remaining deficits: Pt with chronic pain that is impacted by activity, depression and fibromyalgia.  Pt is independent in strategies to manage this and HEP/gym exercises.    Education / Equipment: HEP, gym exercises, pool exercises    Patient agrees to discharge. Patient goals were partially met. Patient is being discharged due to being pleased with the current functional level.   Sigurd Sos, PT 07/28/21 12:26 PM   [facility] [address], [place], [place] [address] Phone # [phone] Fax [phone]

## 2021-08-02 DIAGNOSIS — W57XXXA Bitten or stung by nonvenomous insect and other nonvenomous arthropods, initial encounter: Secondary | ICD-10-CM | POA: Diagnosis not present

## 2021-08-02 DIAGNOSIS — S20371A Other superficial bite of right front wall of thorax, initial encounter: Secondary | ICD-10-CM | POA: Diagnosis not present

## 2021-08-02 DIAGNOSIS — R194 Change in bowel habit: Secondary | ICD-10-CM | POA: Diagnosis not present

## 2021-08-02 DIAGNOSIS — L089 Local infection of the skin and subcutaneous tissue, unspecified: Secondary | ICD-10-CM | POA: Diagnosis not present

## 2021-08-02 DIAGNOSIS — K52832 Lymphocytic colitis: Secondary | ICD-10-CM | POA: Diagnosis not present

## 2021-08-03 ENCOUNTER — Ambulatory Visit: Payer: Medicare Other | Admitting: Physical Therapy

## 2021-08-04 ENCOUNTER — Ambulatory Visit: Payer: Medicare Other | Admitting: Rheumatology

## 2021-08-04 DIAGNOSIS — D472 Monoclonal gammopathy: Secondary | ICD-10-CM

## 2021-08-04 DIAGNOSIS — M7061 Trochanteric bursitis, right hip: Secondary | ICD-10-CM

## 2021-08-04 DIAGNOSIS — Z8719 Personal history of other diseases of the digestive system: Secondary | ICD-10-CM

## 2021-08-04 DIAGNOSIS — K811 Chronic cholecystitis: Secondary | ICD-10-CM

## 2021-08-04 DIAGNOSIS — M8589 Other specified disorders of bone density and structure, multiple sites: Secondary | ICD-10-CM

## 2021-08-04 DIAGNOSIS — M19041 Primary osteoarthritis, right hand: Secondary | ICD-10-CM

## 2021-08-04 DIAGNOSIS — G4709 Other insomnia: Secondary | ICD-10-CM

## 2021-08-04 DIAGNOSIS — Z8659 Personal history of other mental and behavioral disorders: Secondary | ICD-10-CM

## 2021-08-04 DIAGNOSIS — M797 Fibromyalgia: Secondary | ICD-10-CM

## 2021-08-04 DIAGNOSIS — M62838 Other muscle spasm: Secondary | ICD-10-CM

## 2021-08-04 DIAGNOSIS — M5136 Other intervertebral disc degeneration, lumbar region: Secondary | ICD-10-CM

## 2021-08-04 DIAGNOSIS — K52832 Lymphocytic colitis: Secondary | ICD-10-CM

## 2021-08-04 DIAGNOSIS — R5383 Other fatigue: Secondary | ICD-10-CM

## 2021-08-04 DIAGNOSIS — M5030 Other cervical disc degeneration, unspecified cervical region: Secondary | ICD-10-CM

## 2021-08-04 DIAGNOSIS — M19071 Primary osteoarthritis, right ankle and foot: Secondary | ICD-10-CM

## 2021-08-04 DIAGNOSIS — F3181 Bipolar II disorder: Secondary | ICD-10-CM

## 2021-08-05 ENCOUNTER — Other Ambulatory Visit: Payer: Self-pay | Admitting: Psychiatry

## 2021-08-05 ENCOUNTER — Ambulatory Visit
Admission: RE | Admit: 2021-08-05 | Discharge: 2021-08-05 | Disposition: A | Discharge status: Home / Self Care | Payer: Medicare Other | Source: Ambulatory Visit | Attending: Family Medicine | Admitting: Family Medicine

## 2021-08-05 DIAGNOSIS — F4310 Post-traumatic stress disorder, unspecified: Secondary | ICD-10-CM

## 2021-08-05 DIAGNOSIS — F331 Major depressive disorder, recurrent, moderate: Secondary | ICD-10-CM

## 2021-08-05 DIAGNOSIS — M8589 Other specified disorders of bone density and structure, multiple sites: Secondary | ICD-10-CM | POA: Diagnosis not present

## 2021-08-05 DIAGNOSIS — Z780 Asymptomatic menopausal state: Secondary | ICD-10-CM | POA: Diagnosis not present

## 2021-08-05 DIAGNOSIS — F5105 Insomnia due to other mental disorder: Secondary | ICD-10-CM

## 2021-08-05 DIAGNOSIS — M810 Age-related osteoporosis without current pathological fracture: Secondary | ICD-10-CM

## 2021-08-05 DIAGNOSIS — F4001 Agoraphobia with panic disorder: Secondary | ICD-10-CM

## 2021-08-05 DIAGNOSIS — F411 Generalized anxiety disorder: Secondary | ICD-10-CM

## 2021-08-05 DIAGNOSIS — F422 Mixed obsessional thoughts and acts: Secondary | ICD-10-CM

## 2021-08-10 ENCOUNTER — Ambulatory Visit: Payer: Medicare Other | Admitting: Addiction (Substance Use Disorder)

## 2021-08-10 ENCOUNTER — Ambulatory Visit: Payer: Medicare Other | Admitting: Obstetrics & Gynecology

## 2021-08-11 ENCOUNTER — Encounter: Payer: Self-pay | Admitting: Psychiatry

## 2021-08-11 ENCOUNTER — Ambulatory Visit (INDEPENDENT_AMBULATORY_CARE_PROVIDER_SITE_OTHER): Payer: Medicare Other | Admitting: Psychiatry

## 2021-08-11 DIAGNOSIS — F4001 Agoraphobia with panic disorder: Secondary | ICD-10-CM

## 2021-08-11 DIAGNOSIS — F331 Major depressive disorder, recurrent, moderate: Secondary | ICD-10-CM | POA: Diagnosis not present

## 2021-08-11 DIAGNOSIS — F411 Generalized anxiety disorder: Secondary | ICD-10-CM | POA: Diagnosis not present

## 2021-08-11 DIAGNOSIS — F5081 Binge eating disorder: Secondary | ICD-10-CM | POA: Diagnosis not present

## 2021-08-11 DIAGNOSIS — F902 Attention-deficit hyperactivity disorder, combined type: Secondary | ICD-10-CM | POA: Diagnosis not present

## 2021-08-11 DIAGNOSIS — F428 Other obsessive-compulsive disorder: Secondary | ICD-10-CM

## 2021-08-11 DIAGNOSIS — F5105 Insomnia due to other mental disorder: Secondary | ICD-10-CM | POA: Diagnosis not present

## 2021-08-11 MED ORDER — LISDEXAMFETAMINE DIMESYLATE 40 MG PO CAPS: 40.0000 mg | ORAL_CAPSULE | ORAL | 0 refills | Status: DC

## 2021-08-11 MED ORDER — LISDEXAMFETAMINE DIMESYLATE 40 MG PO CAPS: 40.0000 mg | ORAL_CAPSULE | Freq: Every day | ORAL | 0 refills | Status: DC

## 2021-08-11 NOTE — Progress Notes (Signed)
Danielle Bruce [id number] Mar 02, 1960 62 y.o.   Subjective:   Patient ID:  Danielle Bruce is a 61 y.o. (DOB 02/19/1961) female.  Chief Complaint:  Chief Complaint  Patient presents with   Follow-up   Depression   Anxiety   ADD   Fatigue    HPI Danielle Bruce presents for follow-up of multiple dxes.  visit in April and taken off lamotrigine DT possible skin reaction.  Had appt with [facility] dermatology and they assume that skin reaction with itching was related to lamotrigine. 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 Per oncology most likely drug is lamotrigine. And she weaned off of the medication to determine if the cutaneous lesions are related. Lesions seem less without lamotrigine.  At this point skin reaction is presumed related to lamotrigine.  No other disease sx. She is well aware that she has been on lamotrigine for many years with good results for depression control. There is a risk of relapse as she comes off of the medication and she is worried about that. Smart phone helps her cognitive  And other productivity.    seen October 2020.  Restarted low dosage ADD bc concerns over STM, concentration and productivity.  Ritalin 5 mg twice daily.  Also suggested she retry N-acetylcysteine for mild cognitive complaints.   seen April 07, 2019 the following was noted: Mostly good.  Anxiety is mostly better, but depression is a little worse seasonally.  Not able to go help her daughter.   Low energy but does have motivation.  Ritalin helps energy and focus but doesn't take it regularly bc irregular sleep cycle.   Ativan only when having medical issues.    Therapy working on trauma issues is very hard.  Struggling with the fact she doesn't have memory of large parts of her past.   No manic sx off mood stabilizers.  Covid isolation reduce stressors overall and  not prominently depressed.   No meds were changed.  June 25, 2019 appointment the following is noted: F real worried about Covid despite vaccination.   Started fluvoxamine 25 on May 14, 2019 from PCP mainly for inflammation but possibly thinking she might be depressed. Seen benefit for energy and sleep pattern and more appropriate feelings and less numb.  Now realizes she had been depressed for awhile but just numb with depression.  Anniversaries of deaths of friends including last BF Danielle Bruce a little over a year ago.   Thinks of him daily. His 59 yo D died of unknown cause recently.  Stress D moved to beach. Wonders about increasing the fluvoxamine  Danielle Bruce living in [place] and hasn't seen him in a year. Danielle Bruce on disability for depression. Patient reports more depression.  Plan: Realized lately she's depressed and started Luvox again for it and for the anti-inflammatory potential for her health.  Has seen some benefit.   Prior benefit at 25 mg daily.  Optiion increase but slightly bc med sensitive. AGREE increase fluvoxamine to 37.5 mg daily.  08/06/2019 appointment with the following noted: Increased fluvoxamine to 37.5 mg and it helped the depression. Could see a big difference and had been more depressed than she even realized. Outlook better and sleep pattern normalized.    More energy and motivation.   But also started having prolonged HA.  HA lasted a couple of weeks.   Assumed HA was DT increased fluvoxamine so reduced to  25 mg on 07/28/19.  07/29/19 Episode of confusion occurred also.  Went to doctor while confused.  They couldn't determine cause.    Resolved in 1 day. FM flare up since fall/winter. To beach with daughter 3 mos minimum to give her shots for IVF treatment. Patient reports panic recently and recent difficulty with anxiety.    Occ flashbacks.   Patient denies difficulty with sleep initiation or maintenance. Denies appetite disturbance.  Patient reports that energy and  motivation have been good.  Patient denies any difficulty with concentration.  Patient denies any suicidal ideation. Plan: AGREE increase fluvoxamine to 37.5 mg daily once HA is controlled for several weekcs.  01/01/20 appt with the following noted: Increased fluvoxamine for awhile but got more HA and reduced it. Anxiety is better in general now.  But is interested in increasing it DT coming winter. When anxiety is not good it catches her off guard. Likely had Covid in August but early test was negative.  Got prednisone but sick for 5 weeks.  Lost taste and smell still going on.  D also had Covid.  Felt really sick and terrified with bad mental health at the time.  Was traumatic for her.  Had been vaccinated.   Had panic attack at the ER and took a long time to get the Ativan.  D moving back to area and pt will have to help with childcare so hopes to more aggressively treat her health problems. 2 gkids and 2 more coming. Danielle Bruce married without kids yet. Plan: started Luvox again for it and for the anti-inflammatory potential for her health.  Has seen some benefit.   Prior benefit at 25 mg daily.  Optiion increase but slightly bc med sensitive. AGREE increase fluvoxamine to 37.5 mg daily once HA is controlled for several weeks.  03/03/2020 appointment with the following noted: On Day 10 of Covid.  2nd bout. A lot easier this time. Had gotten Luvox up to 50 mg daily and tolerated.  Then doubled it to 100 mg daily. Wonders about the proper dose for maintenance.   No taste and smell, exhausted, congestion and mild cough.  Energy is better than it was in the beginning.  Made her depressed and anxious again and that part is better also.  Last time had to go to ER with Covid.  Only ER visit in 2-3 years.  Less migraine than in the past. Overall was doing better with last couple months.  Trying to do anti-inflammatory diet.  Vegetarian, no dairy or gluten.  Natural sugars.   Still some anxiety. D moved  back here and pregnant with 2nd set of twins. Current twins are 59 yo.   Will restart trauma therapy and it's helped over the last couple of years.  More freedom from things.      Less daily dread with anxiety but still some panic which can be triggered.  Less OCD with fluvoxamine unless triggered.   Taking Ritalin 10 mg each AM and tolerating and benefiting. occ NM with few dreams overall. Plan: started Luvox again for it and for the anti-inflammatory potential for her health.  Has seen some benefit.   Prior benefit at 25 mg daily.  Med sensitive. AGREE increase fluvoxamine to  50  mg daily. Up to 100 mg daily if desired.   04/22/19 appt with following noted: Mostly good and randomly psycho.  Really busy with Danielle Bruce [redacted] weeks pregnant with twins and twins 59 yo at home.  She's helping and  her H is away. Mo older and slower.  Pt overworking.  When with Danielle Bruce kids interfere with sleep.  Doesn't eat as well with Danielle Bruce affects her FM and energy.   Recently exhausted and needed rest but couldn't go home DT D had to go to hospital.  Her H critical at times and she lost it emotionally over this. Almost suicidal reaction with weird intrusive thoughts in response to the criticism.   Got a break and it helped.   Rheum adjusted Lyrica to BID. Had accident fall with one of babies and was bleeding.  She became hysterical.   Upset now that she couldn't control her emotions at the time.  Couldn't get herself under control to deal with the crisis.    Overall doing ok but doesn't handle stressors well and lose it.   Plan: AGREE with increase fluvoxamine to  50  mg daily. Up to 100 mg daily if desired.  06/23/2020 appointment with the following noted: Increased fluvoxamine 25 AM and 50 mg PM and tolerated it.  Been on this dose awhile.  Going to [place] May 6-19 and son taking her.  Working for [employer] and been successful. Also going to [place], [place].  Excited.   Occ overwhelmed. Starting therapy with  Danielle Bruce and looking forward to it and dealing with trauma stuff. Nervous about doing the therapy and getting decompensated like what happened last time. Prednisone hellps mood and body. Not as depressed as in the past.  Sleep is reasonably good.  Appetite is normal.  Anxiety intermittent.  No suicidal thoughts Plan: No med changes  08/19/2020 appointment with the following noted: Not good.  Dog died 2 week ago and was very close to her for 16 years.  She had been a great comfort during losses and health problems.  Dog helped her through periods of SI. Exhausted. Danielle Bruce in [place] and going in early July. Wonders how long this will last.  Also increased fluvoxamine and now out and having withdrawal. Disc difference between grief and depression. Danielle Bruce needs therapist who takes Medicaid. Plan: no med changes  01/10/21 appt noted: Had covid 3rd time. Still on fluvox 50, Ritalin 10 mg daily, Deplin, trazodone 50 mg HS. Helping with gkids and been sick a lot since September. Been doing so so overall.  At times is good. Son-in-law will be getting raise and D can quit work for a year or so. Danielle Bruce and his wife in [place] and that's good. Bruce getting old. Both Bruce hearing problems and M more forgetful. Bruce personality different.  Less therapy lately bc schedules.  Has dealt with a lot of trauma and will resume after new year and feels it has been helpful.  04/13/21 appt noted: More problems with appetite increase for a couple of years.  Always hungry.  Wonders about changing stimulant for this.  Weight up too much. Needs to lose 15+ # DT chronic pain including hips and knees. More depression.  Seasonally.  Episodic panic and anxiety chronic. Some intermittent conflict with daughter.   Needs Ativan intermittently. Still working through trauma issues. Continues therapy.   Haven't gotten back into church.  Not wanting to go out much. Plan: Benefit  Ritalin 10 mg AM and noon for cognitive and  ADD reasons and disc risk of palpitations.  Per request ok trial Vyvanse for binge eating and ADD instead of Ritalin per PCP suggestion May also help mood and weight loss should help pain. Caution it could cause anxiety  06/06/2021 appointment  with the following noted: Did get a prescription for Vyvanse 30 mg on 04/28/2021 filled.  Tried that in place of Ritalin. Better energy and focus with Vyvanse and less binge eating and lost 7-8 #.  Helped lose prednisone weight from November. Seems to wear off in 5-6 hours.   In afternoon when it wears off just wants to sit around.  No sig SE. Thinks she is still a little depressed and not enjoying things like she should. Chronic colitis with recent flare.   To [place] in May for a couple of weeks with son and his wife. Plan: Per request ok trial Vyvanse for binge eating and ADD and increase  to 40 mg daily per PCP suggestion  08/11/2021 appointment with the following noted: Tolerating increase Vyvanse 40 AM.  No SE.   Less crash with it and longer duration. Lost 14#.  Less craving.   Still kind of depressed.  B moved back after divorce.  F mad about it.  M health problems. Home more stressful.   Went to [place]. M acting odd and F cognitive problems. Poor sleep at home bc temp is so warm for Bruce. OC sx a little worse but not severe.  Past psych meds: Lithium, carbamazepine, oxcarbazepine, topiramate, valproic acid, lamotrigine, gabapentin  Zyprexa, Seroquel, Latuda headaches, Geodon,  Risperidone, Stelazine,  Rexulti,  Wellbutrin with side effects,  sertraline irritability, duloxetine,  fluvoxamine 100 with Covid, fluoxetine, Stopped desipramine DT skin issues which now resolved.  It helped GI px.  buspirone side effects,   N-acetylcysteine,  pramipexole,  methylphenidate,  Adderall,  Vyvanse 40 Ambien with amnesia, trazodone, Ativan  Review of Systems:  Review of Systems  HENT:  Negative for congestion.   Gastrointestinal:  Positive for  abdominal pain. Negative for nausea.  Musculoskeletal:  Positive for back pain and myalgias.  Neurological:  Positive for headaches. Negative for tremors and weakness.  Psychiatric/Behavioral:  Positive for dysphoric mood.   Less HA with new meds.  Medications: I have reviewed the patient's current medications.  Current Outpatient Medications  Medication Sig Dispense Refill   albuterol (PROVENTIL HFA;VENTOLIN HFA) 108 (90 Base) MCG/ACT inhaler Inhale 2 puffs into the lungs every 6 (six) hours as needed for wheezing or shortness of breath.     Ascorbic Acid (VITAMIN C) 100 MG tablet Take 100 mg by mouth daily.     azelastine (ASTELIN) 0.1 % nasal spray Place into both nostrils 2 (two) times daily. Use in each nostril as directed     Azelastine HCl 0.15 % SOLN Place 2 sprays into both nostrils daily.     BEPREVE 1.5 % SOLN Place 1 drop into both eyes daily.      Calcium Carb-Cholecalciferol (CALCIUM 1000 + D) 1000-20 MG-MCG TABS Take by mouth.     Coenzyme Q10 (CO Q 10 PO) Take by mouth daily.     cyclobenzaprine (FLEXERIL) 10 MG tablet Take 10 mg by mouth at bedtime as needed.     diphenhydrAMINE (BENADRYL) 25 mg capsule Take 50 mg by mouth every 6 (six) hours as needed (HEADACHES).     EPINEPHrine 0.3 mg/0.3 mL IJ SOAJ injection      Erenumab-aooe 70 MG/ML SOAJ Inject into the skin every 28 (twenty-eight) days.     fluticasone (FLONASE) 50 MCG/ACT nasal spray Place 2 sprays into both nostrils daily. 16 g 0   fluvoxaMINE (LUVOX) 50 MG tablet TAKE 1 TABLET(50 MG) BY MOUTH TWICE DAILY 180 tablet 1   hydrocortisone (ANUSOL-HC) 25 MG  suppository 1 suppository     ibuprofen (ADVIL) 800 MG tablet 1 tablet with food or milk as needed     L-methylfolate Calcium 15 MG TABS TAKE 1 TABLET BY MOUTH DAILY 30 tablet 11   levocetirizine (XYZAL) 5 MG tablet Take 5 mg by mouth every evening.  5   LORazepam (ATIVAN) 1 MG tablet Take 1 tablet (1 mg total) by mouth every 8 (eight) hours as needed for anxiety.  (takes 2 mg when having a medical procedure.) 30 tablet 1   methocarbamol (ROBAXIN) 500 MG tablet Take 1 tablet (500 mg total) by mouth daily as needed for muscle spasms. 30 tablet 1   montelukast (SINGULAIR) 10 MG tablet Take 10 mg by mouth at bedtime.     NURTEC 75 MG TBDP Take 1 tablet by mouth daily as needed.     oxyCODONE-acetaminophen (PERCOCET/ROXICET) 5-325 MG per tablet Take 2 tablets by mouth daily as needed for moderate pain or severe pain (mighraine.).      polyethylene glycol (MIRALAX / GLYCOLAX) packet Take 17 g by mouth daily as needed for mild constipation.     pravastatin (PRAVACHOL) 40 MG tablet 1 tablet     pregabalin (LYRICA) 50 MG capsule Take 100 mg by mouth at bedtime. 1 tab in am and 1 tab HS.     PROLIA 60 MG/ML SOSY injection BRING TO THE OFFICE FOR INJECTION ON 02/15/2018 AS DIRECTED ONCE EVERY 6 MONTHS     promethazine (PHENERGAN) 25 MG tablet Take 25 mg by mouth every 6 (six) hours as needed for nausea.     traZODone (DESYREL) 50 MG tablet TAKE 1 TABLET(50 MG) BY MOUTH AT BEDTIME 90 tablet 1   TURMERIC PO Take by mouth daily.     VASCEPA 1 g CAPS TK 2 CS PO BID WC  3   Vitamin A 2400 MCG (8000 UT) CAPS 1 capsule with food or milk     VITAMIN D PO Take by mouth. Take 5075m daily     VOLTAREN 1 % GEL Apply 2 g topically 4 (four) times daily as needed (JOINT PAIN).   3   Wheat Dextrin (BENEFIBER PO) 1 Tablespoon     [START ON 09/08/2021] lisdexamfetamine (VYVANSE) 40 MG capsule Take 1 capsule (40 mg total) by mouth daily. 30 capsule 0   lisdexamfetamine (VYVANSE) 40 MG capsule Take 1 capsule (40 mg total) by mouth every morning. 30 capsule 0   LORazepam (ATIVAN) 2 MG/ML concentrated solution Take 0.5 mLs (1 mg total) by mouth every 8 (eight) hours as needed for anxiety (severe panic). 30 mL 1   SYMBICORT 80-4.5 MCG/ACT inhaler SMARTSIG:2 Puff(s) By Mouth Twice Daily PRN     No current facility-administered medications for this visit.    Medication Side Effects:  None  Allergies:  Allergies  Allergen Reactions   Sulfa Antibiotics Nausea And Vomiting    Causes pt to vomit blood Other reaction(s): vomiting blood   Gluten Meal Other (See Comments)    Sensitivity.    Lactose Intolerance (Gi) Nausea And Vomiting   Aspirin     Other reaction(s): colitis   Bromfed Dm [Pseudoeph-Bromphen-Dm]     Other reaction(s): strung out and hallucinating   Chlorpromazine     Other reaction(s): Dark thoughts   Desipramine     Other reaction(s): rash   Gabapentin     Other reaction(s): swelling / fuzzy vision   Hydrocodone     Other reaction(s): causes hangovers   Lamotrigine  Other reaction(s): rash   Metronidazole     Other reaction(s): vomiting blood   Morphine Sulfate     Other reaction(s): tearful   Nsaids     Other reaction(s): colitis   Flagyl [Metronidazole Hcl] Nausea And Vomiting   Morphine And Related Other (See Comments)    Reaction: Depression, emotional    Past Medical History:  Diagnosis Date   Anemia    Anxiety    Arthritis    osteoarthritis   ASCUS (atypical squamous cells of undetermined significance) on Pap smear 05/06/2005   NEG HIGH RISK HPV--C&B BIOPSY BENIGN 12/2005   Asthma    Bipolar 2 disorder (HCC)    Cancer ([place])    skin cancer - basal cell   Colon polyps    Complication of anesthesia    anxious afterwards, will get headaches    Constipation    Depression    Fibromyalgia 10/2013   GERD (gastroesophageal reflux disease)    Hearing loss on left    Heart murmur    never had any problems   Hemorrhoids    High cholesterol    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   cytology negative   IBS (irritable bowel syndrome)    Insomnia    Lymphocytic colitis    Lymphoma (HCC)    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   Bone marrow biopsy showes 8% plasma cells IgA Lambda   Migraines    Osteoarthritis    Osteopenia    Peripheral neuropathy    PTSD (post-traumatic stress disorder)     Family  History  Problem Relation Age of Onset   Diabetes Father    Hypertension Father    Hyperlipidemia Father    Depression Daughter    Anxiety disorder Daughter    Asthma Daughter    Heart disease Maternal Grandfather    Heart disease Paternal Grandmother    Heart disease Paternal Grandfather    Depression Son    Depression Son    Anxiety disorder Son    Breast cancer Neg Hx     Social History   Socioeconomic History   Marital status: Divorced    Spouse name: Not on file   Number of children: 3   Years of education: Not on file   Highest education level: Not on file  Occupational History   Not on file  Tobacco Use   Smoking status: Former    Types: Cigarettes    Quit date: 05/13/2014    Years since quitting: 7.2   Smokeless tobacco: Never   Tobacco comments:    social   Vaping Use   Vaping Use: Never used  Substance and Sexual Activity   Alcohol use: Yes    Alcohol/week: 0.0 standard drinks of alcohol    Comment: rare   Drug use: Never   Sexual activity: Not Currently    Comment: -1st intercourse 61 yo-More than 5 partners  Other Topics Concern   Not on file  Social History Narrative   Not on file   Social Determinants of Health   Financial Resource Strain: Not on file  Food Insecurity: Not on file  Transportation Needs: Not on file  Physical Activity: Not on file  Stress: Not on file  Social Connections: Not on file  Intimate Partner Violence: Not on file    Past Medical History, Surgical history, Social history, and Family history were reviewed and updated as appropriate.   Please see review of systems for further details on the  patient's review from today.   Objective:   Physical Exam:  LMP 12/28/2010   Physical Exam Constitutional:      General: She is not in acute distress. Musculoskeletal:        General: No deformity.  Neurological:     Mental Status: She is alert and oriented to person, place, and time.     Coordination: Coordination  normal.  Psychiatric:        Attention and Perception: Attention and perception normal. She does not perceive auditory or visual hallucinations.        Mood and Affect: Mood is anxious and depressed. Affect is not labile, angry, tearful or inappropriate.        Speech: Speech normal.        Behavior: Behavior normal.        Thought Content: Thought content normal. Thought content is not paranoid or delusional. Thought content does not include homicidal or suicidal ideation. Thought content does not include suicidal plan.        Cognition and Memory: Cognition and memory normal.        Judgment: Judgment normal.     Comments: Insight intact Depression not severe, but situational     Lab Review:     Component Value Date/Time   NA 142 08/01/2016 1713   NA 144 07/12/2015 1521   K 4.7 08/01/2016 1713   K 3.8 07/12/2015 1521   CL 107 08/01/2016 1713   CO2 23 08/01/2016 1713   CO2 27 07/12/2015 1521   GLUCOSE 95 08/01/2016 1713   GLUCOSE 82 07/12/2015 1521   BUN 14 08/01/2016 1713   BUN 10.3 07/12/2015 1521   CREATININE 0.87 08/01/2016 1713   CREATININE 0.8 07/12/2015 1521   CALCIUM 10.0 08/01/2016 1713   CALCIUM 9.9 07/12/2015 1521   PROT 7.4 08/01/2016 1713   PROT 7.4 07/12/2015 1521   ALBUMIN 4.8 08/01/2016 1713   ALBUMIN 4.4 07/12/2015 1521   AST 23 08/01/2016 1713   AST 22 07/12/2015 1521   ALT 26 08/01/2016 1713   ALT 31 07/12/2015 1521   ALKPHOS 55 08/01/2016 1713   ALKPHOS 54 07/12/2015 1521   BILITOT 0.7 08/01/2016 1713   BILITOT 0.40 07/12/2015 1521   GFRNONAA 76 08/01/2016 1713   GFRAA 88 08/01/2016 1713       Component Value Date/Time   WBC 5.9 08/01/2016 1713   RBC 4.71 08/01/2016 1713   HGB 14.6 08/01/2016 1713   HGB 14.1 07/12/2015 1521   HCT 43.1 08/01/2016 1713   HCT 41.5 07/12/2015 1521   PLT 254 08/01/2016 1713   PLT 248 07/12/2015 1521   MCV 91.5 08/01/2016 1713   MCV 92.2 07/12/2015 1521   MCH 31.0 08/01/2016 1713   MCHC 33.9 08/01/2016 1713    RDW 13.2 08/01/2016 1713   RDW 13.3 07/12/2015 1521   LYMPHSABS 2,006 08/01/2016 1713   LYMPHSABS 1.8 07/12/2015 1521   MONOABS 354 08/01/2016 1713   MONOABS 0.7 07/12/2015 1521   EOSABS 118 08/01/2016 1713   EOSABS 0.1 07/12/2015 1521   BASOSABS 0 08/01/2016 1713   BASOSABS 0.0 07/12/2015 1521    No results found for: "POCLITH", "LITHIUM"   No results found for: "PHENYTOIN", "PHENOBARB", "VALPROATE", "CBMZ"   .res Assessment: Plan:    Niyonna was seen today for follow-up, depression, anxiety, add and fatigue.  Diagnoses and all orders for this visit:  Major depressive disorder, recurrent episode, moderate (HCC)  Attention deficit hyperactivity disorder (ADHD), combined type -  lisdexamfetamine (VYVANSE) 40 MG capsule; Take 1 capsule (40 mg total) by mouth daily.  Binge-eating disorder, moderate -     lisdexamfetamine (VYVANSE) 40 MG capsule; Take 1 capsule (40 mg total) by mouth daily.  Obsessional thoughts  Generalized anxiety disorder  Panic disorder with agoraphobia  Insomnia due to mental condition  Other orders -     lisdexamfetamine (VYVANSE) 40 MG capsule; Take 1 capsule (40 mg total) by mouth every morning.   Past couple of years more obvious PTSD obvious has called into doubt the bipolar dix bc she's remained mood stable off the lamotrigine.    She has had symptoms consistent with hypomania in the past but there is now some question as to whether they were PTSD related rather than truly bipolar 2.  Her son has had treatment resistant depression with possible bipolar disorder and her daughter has been diagnosed with bipolar disorder but also a history of borderline personality disorder.  So there is a family history of the spectrum.  started Luvox again for it and for the anti-inflammatory potential for her health.  Has seen some benefit.   Prior benefit Taking fluvoxamine 50 mg BID Clear benefit from fluvoxamine for mood quite significantly. No manic  sx. Disc anti-inflammatory effects of fluvoxamine at doses of 100 mg BID for Covid.  2 studies proving benefit so far. HA are better.   Stress management for mood sx. Health problems worsen mood episodically.   Disc stress son's mental health problems.  CBT around agoraphobia.  Need to push herself to get out more.  We discussed the short-term risks associated with benzodiazepines including sedation and increased fall risk among others.  Discussed long-term side effect risk including dependence, potential withdrawal symptoms, and the potential eventual dose-related risk of dementia.  Keep this at a minimum given cognitive concerns.  She only uses it when triggered with anxiety usually medical.  She is not using it regularly.  Discussed potential benefits, risks, and side effects of stimulants with patient to include increased heart rate, palpitations, insomnia, increased anxiety, increased irritability, or decreased appetite.  Instructed patient to contact office if experiencing any significant tolerability issues.  Better with Vyvanse for binge eating and ADD and increase  to 40 mg daily per PCP suggestion May also help mood and weight loss should help pain. Caution it could cause anxiety and other SE  No other med changes  Continue therapy.  Therapist unavailable.  FU 3 mos  Danielle Parents, MD, DFAPA    Please see After Visit Summary for patient specific instructions.  Future Appointments  Date Time Provider [place]  08/24/2021 11:00 AM [name], LCSW CP-CP None  09/07/2021 11:00 AM [name], LCSW [facility] None  09/20/2021  2:00 PM [name], LCSW CP-CP None  10/05/2021  3:00 PM [name], LCSW CP-CP None    No orders of the defined types were placed in this encounter.      -------------------------------

## 2021-08-12 ENCOUNTER — Ambulatory Visit: Payer: Medicare Other | Admitting: Physical Therapy

## 2021-08-17 ENCOUNTER — Ambulatory Visit: Payer: Medicare Other | Admitting: Physical Therapy

## 2021-08-24 ENCOUNTER — Ambulatory Visit: Payer: Medicare Other | Admitting: Addiction (Substance Use Disorder)

## 2021-08-25 DIAGNOSIS — L259 Unspecified contact dermatitis, unspecified cause: Secondary | ICD-10-CM | POA: Diagnosis not present

## 2021-08-31 DIAGNOSIS — H524 Presbyopia: Secondary | ICD-10-CM | POA: Diagnosis not present

## 2021-08-31 DIAGNOSIS — H2513 Age-related nuclear cataract, bilateral: Secondary | ICD-10-CM | POA: Diagnosis not present

## 2021-08-31 DIAGNOSIS — H33302 Unspecified retinal break, left eye: Secondary | ICD-10-CM | POA: Diagnosis not present

## 2021-08-31 DIAGNOSIS — H40023 Open angle with borderline findings, high risk, bilateral: Secondary | ICD-10-CM | POA: Diagnosis not present

## 2021-09-01 DIAGNOSIS — R7301 Impaired fasting glucose: Secondary | ICD-10-CM | POA: Diagnosis not present

## 2021-09-01 DIAGNOSIS — D472 Monoclonal gammopathy: Secondary | ICD-10-CM | POA: Diagnosis not present

## 2021-09-01 DIAGNOSIS — F900 Attention-deficit hyperactivity disorder, predominantly inattentive type: Secondary | ICD-10-CM | POA: Diagnosis not present

## 2021-09-01 DIAGNOSIS — G43909 Migraine, unspecified, not intractable, without status migrainosus: Secondary | ICD-10-CM | POA: Diagnosis not present

## 2021-09-01 DIAGNOSIS — H269 Unspecified cataract: Secondary | ICD-10-CM | POA: Diagnosis not present

## 2021-09-01 DIAGNOSIS — F3181 Bipolar II disorder: Secondary | ICD-10-CM | POA: Diagnosis not present

## 2021-09-01 DIAGNOSIS — M810 Age-related osteoporosis without current pathological fracture: Secondary | ICD-10-CM | POA: Diagnosis not present

## 2021-09-01 DIAGNOSIS — E7800 Pure hypercholesterolemia, unspecified: Secondary | ICD-10-CM | POA: Diagnosis not present

## 2021-09-07 ENCOUNTER — Ambulatory Visit: Payer: Medicare Other | Admitting: Addiction (Substance Use Disorder)

## 2021-09-09 DIAGNOSIS — H35371 Puckering of macula, right eye: Secondary | ICD-10-CM | POA: Diagnosis not present

## 2021-09-09 DIAGNOSIS — H33312 Horseshoe tear of retina without detachment, left eye: Secondary | ICD-10-CM | POA: Diagnosis not present

## 2021-09-09 DIAGNOSIS — H25813 Combined forms of age-related cataract, bilateral: Secondary | ICD-10-CM | POA: Diagnosis not present

## 2021-09-09 DIAGNOSIS — H35413 Lattice degeneration of retina, bilateral: Secondary | ICD-10-CM | POA: Diagnosis not present

## 2021-09-09 DIAGNOSIS — H33323 Round hole, bilateral: Secondary | ICD-10-CM | POA: Diagnosis not present

## 2021-09-20 ENCOUNTER — Ambulatory Visit: Payer: Medicare Other | Admitting: Addiction (Substance Use Disorder)

## 2021-10-03 DIAGNOSIS — H33321 Round hole, right eye: Secondary | ICD-10-CM | POA: Diagnosis not present

## 2021-10-03 DIAGNOSIS — H35411 Lattice degeneration of retina, right eye: Secondary | ICD-10-CM | POA: Diagnosis not present

## 2021-10-03 DIAGNOSIS — H5319 Other subjective visual disturbances: Secondary | ICD-10-CM | POA: Diagnosis not present

## 2021-10-03 DIAGNOSIS — H33312 Horseshoe tear of retina without detachment, left eye: Secondary | ICD-10-CM | POA: Diagnosis not present

## 2021-10-03 DIAGNOSIS — H53141 Visual discomfort, right eye: Secondary | ICD-10-CM | POA: Diagnosis not present

## 2021-10-05 ENCOUNTER — Ambulatory Visit: Payer: Medicare Other | Admitting: Addiction (Substance Use Disorder)

## 2021-10-05 DIAGNOSIS — K589 Irritable bowel syndrome without diarrhea: Secondary | ICD-10-CM | POA: Diagnosis not present

## 2021-10-05 DIAGNOSIS — M810 Age-related osteoporosis without current pathological fracture: Secondary | ICD-10-CM | POA: Diagnosis not present

## 2021-10-05 DIAGNOSIS — H269 Unspecified cataract: Secondary | ICD-10-CM | POA: Diagnosis not present

## 2021-10-05 DIAGNOSIS — F411 Generalized anxiety disorder: Secondary | ICD-10-CM | POA: Diagnosis not present

## 2021-10-05 DIAGNOSIS — R7301 Impaired fasting glucose: Secondary | ICD-10-CM | POA: Diagnosis not present

## 2021-10-05 DIAGNOSIS — E538 Deficiency of other specified B group vitamins: Secondary | ICD-10-CM | POA: Diagnosis not present

## 2021-10-05 DIAGNOSIS — M797 Fibromyalgia: Secondary | ICD-10-CM | POA: Diagnosis not present

## 2021-10-05 DIAGNOSIS — D472 Monoclonal gammopathy: Secondary | ICD-10-CM | POA: Diagnosis not present

## 2021-10-05 DIAGNOSIS — E7800 Pure hypercholesterolemia, unspecified: Secondary | ICD-10-CM | POA: Diagnosis not present

## 2021-10-12 DIAGNOSIS — H33321 Round hole, right eye: Secondary | ICD-10-CM | POA: Diagnosis not present

## 2021-11-02 NOTE — Therapy (Signed)
OUTPATIENT PHYSICAL THERAPY CERVICAL EVALUATION   Patient Name: Danielle Bruce MRN: [id number] DOB:[birth year], 61 y.o., female Today's Date: 11/03/2021   PT End of Session - 11/03/21 1147     Visit Number 1    Date for PT Re-Evaluation 12/29/21    Authorization Type Medicare B- needs KX modifier now    Progress Note Due on Visit 10    PT Start Time 1102    PT Stop Time 1147    PT Time Calculation (min) 45 min    Activity Tolerance Patient tolerated treatment well    Behavior During Therapy [facility] for tasks assessed/performed             Past Medical History:  Diagnosis Date   Anemia    Anxiety    Arthritis    osteoarthritis   ASCUS (atypical squamous cells of undetermined significance) on Pap smear 05/06/2005   NEG HIGH RISK HPV--C&B BIOPSY BENIGN 12/2005   Asthma    Bipolar 2 disorder ([place])    Cancer ([place])    skin cancer - basal cell   Colon polyps    Complication of anesthesia    anxious afterwards, will get headaches    Constipation    Depression    Fibromyalgia 10/2013   GERD (gastroesophageal reflux disease)    Hearing loss on left    Heart murmur    never had any problems   Hemorrhoids    High cholesterol    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   cytology negative   IBS (irritable bowel syndrome)    Insomnia    Lymphocytic colitis    Lymphoma ([facility])    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   Bone marrow biopsy showes 8% plasma cells IgA Lambda   Migraines    Osteoarthritis    Osteopenia    Peripheral neuropathy    PTSD (post-traumatic stress disorder)    Past Surgical History:  Procedure Laterality Date   BONE MARROW BIOPSY Left 12/18/2013   Plasma cell dyscrasia 8% population of plasma cells   BREAST BIOPSY Right    benign stereo   CESAREAN SECTION  01,60,10   CHOLECYSTECTOMY N/A 10/16/2012   Procedure: LAPAROSCOPIC CHOLECYSTECTOMY;  Surgeon: Joyice Faster. Cornett, MD;  Location: [facility];  Service: General;  Laterality: N/A;    COLONOSCOPY     numerous times   DILATION AND CURETTAGE OF UTERUS     ESOPHAGOGASTRODUODENOSCOPY     HEMORRHOID SURGERY  1993   x3   IUD REMOVAL  02/2015   Mirena   LAPAROSCOPIC LYSIS OF ADHESIONS N/A 10/16/2012   Procedure: LAPAROSCOPIC LYSIS OF ADHESIONS;  Surgeon: Marcello Moores A. Cornett, MD;  Location: [facility];  Service: General;  Laterality: N/A;   LAPAROSCOPY N/A 10/16/2012   Procedure: LAPAROSCOPY DIAGNOSTIC;  Surgeon: Joyice Faster. Cornett, MD;  Location: [facility];  Service: General;  Laterality: N/A;   PELVIC LAPAROSCOPY     RADIOLOGY WITH ANESTHESIA N/A 12/16/2015   Procedure: MRI OF BRAIN WITH AND WITHOUT CONTRAST;  Surgeon: Medication Radiologist, MD;  Location: [place];  Service: Radiology;  Laterality: N/A;   SHOULDER SURGERY  2007/2008   SPINE SURGERY  2010   cervical   Patient Active Problem List   Diagnosis Date Noted   GAD (generalized anxiety disorder) 12/26/2017   OCD (obsessive compulsive disorder) 12/26/2017   PTSD (post-traumatic stress disorder) 12/26/2017   DDD (degenerative disc disease), cervical 07/14/2016   Primary osteoarthritis of both feet 07/14/2016   Primary osteoarthritis of both  hands 07/14/2016   Other fatigue 07/14/2016   History of IBS 07/14/2016   Osteopenia of multiple sites 07/14/2016   Fibromyalgia 01/13/2016   MGUS (monoclonal gammopathy of unknown significance) 12/23/2013   Chronic cholecystitis without calculus 10/18/2012   Abdominal pain, unspecified site 10/18/2012   Nausea alone 10/18/2012   Unspecified constipation 10/18/2012   Depression 10/18/2012   Anxiety    ASCUS (atypical squamous cells of undetermined significance) on Pap smear    IUD    Hemorrhoids 11/29/2010   Abdominal pain, left upper quadrant 11/29/2010    PCP:  Mayra Neer, MD  REFERRING PROVIDER: Mayra Neer, MD  REFERRING DIAG: fibromyalgia  THERAPY DIAG:  Muscle weakness (generalized) - Plan: PT plan of care cert/re-cert  Cramp and spasm - Plan: PT plan of care  cert/re-cert  Abnormal posture - Plan: PT plan of care cert/re-cert  Cervicalgia - Plan: PT plan of care cert/re-cert  Rationale for Evaluation and Treatment Rehabilitation  ONSET DATE: chronic pain (fibromyalgia) with flare-up 2 months ago  SUBJECTIVE:                                                                                                                                                                                                         SUBJECTIVE STATEMENT: Pt is well known to me and presents to PT with flare up of fibromyalgia symptoms. Pt reports that she "crashed" after returning from [place] with her family ~2 months ago.  I haven't been doing well overall with depression and headaches related to eye surgery.  PERTINENT HISTORY:  Anxiety, bipolar disorder, depression, fibromyalgia, IBS, migraines, osteopenia, PTSD  PAIN:  Are you having pain? Yes: NPRS scale: 3-4/10 now, up to 6/10 on "bad days" Pain location: neck and upper back, hands, legs (Lt>Rt) Pain description: sore, sharp Aggravating factors: activity, yard work Relieving factors: muscle relaxers, rest, stretching  PRECAUTIONS: Other: chronic pain syndrome and depression Other: slow progression with exercise due to chronic condition  WEIGHT BEARING RESTRICTIONS No  FALLS:  Has patient fallen in last 6 months? No  LIVING ENVIRONMENT: Lives with: lives with their family Lives in: House/apartment  OCCUPATION: on disability  PLOF: Independent with basic ADLs and Leisure: yardwork, walking  Pt cares for her 48 young grandchildren- difficulty with care including lifting and carrying  PATIENT GOALS be more active with less pain, exercise at the gym regularly, lift and carry grandchildren and laundry, sleep with fewer interruptions, get back to more gardening.  OBJECTIVE:   DIAGNOSTIC FINDINGS: none recent   PATIENT SURVEYS: Fibromyalgia Impact Questionnaire: first 2  sections only:    COGNITION: Overall cognitive status: Within functional limits for tasks assessed  SENSATION: WFL  POSTURE: rounded shoulders and forward head  PALPATION: Diffuse palpable tenderness over bil neck, upper traps, thoracic, lumbar and gluteals with trigger points.    CERVICAL ROM:   Active ROM A/PROM (deg) eval  Flexion 55  Extension 40  Right lateral flexion 30  Left lateral flexion 35  Right rotation 50  Left rotation 40   (Blank rows = not tested)  UPPER EXTREMITY ROM: UE A/ROM is limited by 20% into flexion and abduction with pain at end range.  Hip flexibility is limited by 25% in all directions with pain in all directions.  UPPER EXTREMITY MMT: UE: 4+/5, LE 4+/5 except hip flexors 4-/5  TODAY'S TREATMENT:  Date: 11/03/21 Review of all HEP previously issued. PT educated pt to choose 3-5 exercises and perform 3x/day Manual therapy: elongation to bil upper traps, cervical paraspinals and lumbar paraspinals.  PATIENT EDUCATION:  Education details: Access Code: [email or web address] (review from previous session). Discussed strategies to incorporate more movement into her day considering her depression.  Pt will work to implement these strategies.   Person educated: Patient Education method: Explanation, Demonstration, and Handouts Education comprehension: verbalized understanding and returned demonstration   HOME EXERCISE PROGRAM: Access Code: [email or web address]  ASSESSMENT:  CLINICAL IMPRESSION: Patient is a 61 y.o. female who was seen today for physical therapy evaluation and treatment for flare-up of fibromyalgia and widespread pain. Pt was discharged on 07/28/21 from PT and at that time was independent in HEP and gym/aquatic exercise and will be doing this independently.  Pt reports 3-4/10 widespread pain with focus on neck and thoracic region and up to 6/10 pain with lifting and performing housework and yardwork.  Pt is challenged with lifting and carrying her grandchildren and laundry  and is waking at night with pain.  She demonstrates limited and painful mobility in all areas today.  Patient will benefit from skilled PT to address the below impairments and improve overall function.    OBJECTIVE IMPAIRMENTS decreased activity tolerance, decreased endurance, difficulty walking, decreased strength, increased muscle spasms, impaired flexibility, improper body mechanics, postural dysfunction, and pain.   ACTIVITY LIMITATIONS carrying, lifting, sitting, standing, reach over head, hygiene/grooming, and locomotion level  PARTICIPATION LIMITATIONS: meal prep, cleaning, laundry, driving, community activity, and yard work  PERSONAL FACTORS Past/current experiences, Time since onset of injury/illness/exacerbation, and 3+ comorbidities: fibromyalgia depression, anxiety,   are also affecting patient's functional outcome.   REHAB POTENTIAL: Good  CLINICAL DECISION MAKING: Evolving/moderate complexity  EVALUATION COMPLEXITY: Moderate   GOALS: Goals reviewed with patient? Yes  SHORT TERM GOALS: Target date: 12/01/2021   Return to regular performance of HEP 2-3x/day to improve mobility  Baseline: not performing HEP Goal status: INITIAL  2.  Report > or = to 25% reduction in cervical and lumbar pain with ADLs and self-care  Baseline: constant and 3-4/10  Goal status: INITIAL  3.  Reduce fibromyalgia impact score to < or = to 39  Baseline: 49 Goal status: INITIAL    LONG TERM GOALS: Target date: 12/29/2021  Be independent in advanced HEP Baseline: not currently exercising  Goal status: INITIAL  2.  Reduce fibromyalgia impact questionnaire to < or = to 29 Baseline: 49 Goal status: INITIAL  3.  Lift and carry laundry and her grandchildren with > or = to 50% reduction in pain Baseline: 7/10 Goal status: INITIAL  4.  Report > or = to 50% reduction in neck  and lumbar pain with ADLs and self-care  Baseline: 3-4/10 constant pain  Goal status: INITIAL  5.  Return to  yardwork verbalize appropriate rest breaks and mechanics  Baseline:  Goal status: INITIAL   PLAN: PT FREQUENCY: 2x/week  PT DURATION: 8 weeks  PLANNED INTERVENTIONS: Therapeutic exercises, Therapeutic activity, Neuromuscular re-education, Balance training, Gait training, Patient/Family education, Self Care, Joint mobilization, Aquatic Therapy, Dry Needling, Spinal manipulation, Spinal mobilization, Cryotherapy, Moist heat, Taping, Traction, Manual therapy, and Re-evaluation  PLAN FOR NEXT SESSION: resume PT for manual to address myofascial pain, aquatics and progression of activity.   Sigurd Sos, PT 11/03/21 12:12 PM   [facility] [address], [place], [place] [address] Phone # [phone] Fax [phone]

## 2021-11-03 ENCOUNTER — Other Ambulatory Visit: Payer: Self-pay

## 2021-11-03 ENCOUNTER — Ambulatory Visit: Payer: Medicare Other | Attending: Family Medicine

## 2021-11-03 DIAGNOSIS — Z9189 Other specified personal risk factors, not elsewhere classified: Secondary | ICD-10-CM | POA: Diagnosis not present

## 2021-11-03 DIAGNOSIS — M797 Fibromyalgia: Secondary | ICD-10-CM | POA: Insufficient documentation

## 2021-11-03 DIAGNOSIS — M6281 Muscle weakness (generalized): Secondary | ICD-10-CM

## 2021-11-03 DIAGNOSIS — Z8619 Personal history of other infectious and parasitic diseases: Secondary | ICD-10-CM | POA: Diagnosis not present

## 2021-11-03 DIAGNOSIS — R109 Unspecified abdominal pain: Secondary | ICD-10-CM | POA: Diagnosis not present

## 2021-11-03 DIAGNOSIS — R252 Cramp and spasm: Secondary | ICD-10-CM

## 2021-11-03 DIAGNOSIS — R293 Abnormal posture: Secondary | ICD-10-CM

## 2021-11-03 DIAGNOSIS — M542 Cervicalgia: Secondary | ICD-10-CM

## 2021-11-04 DIAGNOSIS — R109 Unspecified abdominal pain: Secondary | ICD-10-CM | POA: Diagnosis not present

## 2021-11-04 DIAGNOSIS — H35371 Puckering of macula, right eye: Secondary | ICD-10-CM | POA: Diagnosis not present

## 2021-11-04 DIAGNOSIS — H35413 Lattice degeneration of retina, bilateral: Secondary | ICD-10-CM | POA: Diagnosis not present

## 2021-11-04 DIAGNOSIS — H269 Unspecified cataract: Secondary | ICD-10-CM | POA: Diagnosis not present

## 2021-11-04 DIAGNOSIS — F411 Generalized anxiety disorder: Secondary | ICD-10-CM | POA: Diagnosis not present

## 2021-11-04 DIAGNOSIS — H33321 Round hole, right eye: Secondary | ICD-10-CM | POA: Diagnosis not present

## 2021-11-04 DIAGNOSIS — E538 Deficiency of other specified B group vitamins: Secondary | ICD-10-CM | POA: Diagnosis not present

## 2021-11-04 DIAGNOSIS — H25813 Combined forms of age-related cataract, bilateral: Secondary | ICD-10-CM | POA: Diagnosis not present

## 2021-11-04 DIAGNOSIS — H31091 Other chorioretinal scars, right eye: Secondary | ICD-10-CM | POA: Diagnosis not present

## 2021-11-04 DIAGNOSIS — M797 Fibromyalgia: Secondary | ICD-10-CM | POA: Diagnosis not present

## 2021-11-04 DIAGNOSIS — K589 Irritable bowel syndrome without diarrhea: Secondary | ICD-10-CM | POA: Diagnosis not present

## 2021-11-04 DIAGNOSIS — M810 Age-related osteoporosis without current pathological fracture: Secondary | ICD-10-CM | POA: Diagnosis not present

## 2021-11-04 DIAGNOSIS — G43909 Migraine, unspecified, not intractable, without status migrainosus: Secondary | ICD-10-CM | POA: Diagnosis not present

## 2021-11-09 ENCOUNTER — Ambulatory Visit: Payer: Medicare Other

## 2021-11-09 DIAGNOSIS — R252 Cramp and spasm: Secondary | ICD-10-CM

## 2021-11-09 DIAGNOSIS — M797 Fibromyalgia: Secondary | ICD-10-CM | POA: Diagnosis not present

## 2021-11-09 DIAGNOSIS — R293 Abnormal posture: Secondary | ICD-10-CM

## 2021-11-09 DIAGNOSIS — M6281 Muscle weakness (generalized): Secondary | ICD-10-CM

## 2021-11-09 DIAGNOSIS — M542 Cervicalgia: Secondary | ICD-10-CM

## 2021-11-09 NOTE — Therapy (Signed)
OUTPATIENT PHYSICAL THERAPY TREATMENT   Patient Name: Danielle Bruce MRN: [id number] DOB:November 18, 1960, 61 y.o., female Today's Date: 11/09/2021   PT End of Session - 11/09/21 1059     Visit Number 2    Date for PT Re-Evaluation 12/29/21    Authorization Type Medicare B- needs KX modifier now    Progress Note Due on Visit 10    PT Start Time 1017    PT Stop Time 1059    PT Time Calculation (min) 42 min    Activity Tolerance Patient tolerated treatment well    Behavior During Therapy [facility] for tasks assessed/performed              Past Medical History:  Diagnosis Date   Anemia    Anxiety    Arthritis    osteoarthritis   ASCUS (atypical squamous cells of undetermined significance) on Pap smear 05/06/2005   NEG HIGH RISK HPV--C&B BIOPSY BENIGN 12/2005   Asthma    Bipolar 2 disorder ([place])    Cancer ([place])    skin cancer - basal cell   Colon polyps    Complication of anesthesia    anxious afterwards, will get headaches    Constipation    Depression    Fibromyalgia 10/2013   GERD (gastroesophageal reflux disease)    Hearing loss on left    Heart murmur    never had any problems   Hemorrhoids    High cholesterol    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   cytology negative   IBS (irritable bowel syndrome)    Insomnia    Lymphocytic colitis    Lymphoma (HCC)    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   Bone marrow biopsy showes 8% plasma cells IgA Lambda   Migraines    Osteoarthritis    Osteopenia    Peripheral neuropathy    PTSD (post-traumatic stress disorder)    Past Surgical History:  Procedure Laterality Date   BONE MARROW BIOPSY Left 12/18/2013   Plasma cell dyscrasia 8% population of plasma cells   BREAST BIOPSY Right    benign stereo   CESAREAN SECTION  28,41,32   CHOLECYSTECTOMY N/A 10/16/2012   Procedure: LAPAROSCOPIC CHOLECYSTECTOMY;  Surgeon: Joyice Faster. Cornett, MD;  [facility];  Service: General;  Laterality: N/A;   COLONOSCOPY      numerous times   DILATION AND CURETTAGE OF UTERUS     ESOPHAGOGASTRODUODENOSCOPY     HEMORRHOID SURGERY  1993   x3   IUD REMOVAL  02/2015   Mirena   LAPAROSCOPIC LYSIS OF ADHESIONS N/A 10/16/2012   Procedure: LAPAROSCOPIC LYSIS OF ADHESIONS;  Surgeon: Marcello Moores A. Cornett, MD;  Location[facility];  Service: General;  Laterality: N/A;   LAPAROSCOPY N/A 10/16/2012   Procedure: LAPAROSCOPY DIAGNOSTIC;  Surgeon: Joyice Faster. Cornett, MD;  [facility];  Service: General;  Laterality: N/A;   PELVIC LAPAROSCOPY     RADIOLOGY WITH ANESTHESIA N/A 12/16/2015   Procedure: MRI OF BRAIN WITH AND WITHOUT CONTRAST;  Surgeon: Medication Radiologist, MD;  Location: [place];  Service: Radiology;  Laterality: N/A;   SHOULDER SURGERY  2007/2008   SPINE SURGERY  2010   cervical   Patient Active Problem List   Diagnosis Date Noted   GAD (generalized anxiety disorder) 12/26/2017   OCD (obsessive compulsive disorder) 12/26/2017   PTSD (post-traumatic stress disorder) 12/26/2017   DDD (degenerative disc disease), cervical 07/14/2016   Primary osteoarthritis of both feet 07/14/2016   Primary osteoarthritis of both  hands 07/14/2016   Other fatigue 07/14/2016   History of IBS 07/14/2016   Osteopenia of multiple sites 07/14/2016   Fibromyalgia 01/13/2016   MGUS (monoclonal gammopathy of unknown significance) 12/23/2013   Chronic cholecystitis without calculus 10/18/2012   Abdominal pain, unspecified site 10/18/2012   Nausea alone 10/18/2012   Unspecified constipation 10/18/2012   Depression 10/18/2012   Anxiety    ASCUS (atypical squamous cells of undetermined significance) on Pap smear    IUD    Hemorrhoids 11/29/2010   Abdominal pain, left upper quadrant 11/29/2010    PCP:  Mayra Neer, MD  REFERRING PROVIDER: Mayra Neer, MD  REFERRING DIAG: fibromyalgia  THERAPY DIAG:  Muscle weakness (generalized)  Abnormal posture  Cramp and spasm  Cervicalgia  Rationale for Evaluation and  Treatment Rehabilitation  ONSET DATE: chronic pain (fibromyalgia) with flare-up 2 months ago  SUBJECTIVE:                                                                                                                                                                                                         SUBJECTIVE STATEMENT: I am having Lt LE pain and neck/upper trap stretch today.  I spent some time on the floor yesterday and I think that is why my leg hurts. I am working on my plan to get more active.  PERTINENT HISTORY:  Anxiety, bipolar disorder, depression, fibromyalgia, IBS, migraines, osteopenia, PTSD  PAIN:  Are you having pain? Yes: NPRS scale: 3-4/10 now, up to 6/10 on "bad days" Pain location: neck and upper back, hands, legs (Lt>Rt) Pain description: sore, sharp, annoyig Aggravating factors: activity, yard work Relieving factors: muscle relaxers, rest, stretching  PRECAUTIONS: Other: chronic pain syndrome and depression Other: slow progression with exercise due to chronic condition  WEIGHT BEARING RESTRICTIONS No  FALLS:  Has patient fallen in last 6 months? No  LIVING ENVIRONMENT: Lives with: lives with their family Lives in: House/apartment  OCCUPATION: on disability  PLOF: Independent with basic ADLs and Leisure: yardwork, walking  Pt cares for her 67 young grandchildren- difficulty with care including lifting and carrying  PATIENT GOALS be more active with less pain, exercise at the gym regularly, lift and carry grandchildren and laundry, sleep with fewer interruptions, get back to more gardening.  OBJECTIVE:   DIAGNOSTIC FINDINGS: none recent   PATIENT SURVEYS: Fibromyalgia Impact Questionnaire: first 2 sections only:   COGNITION: Overall cognitive status: Within functional limits for tasks assessed  SENSATION: WFL  POSTURE: rounded shoulders and forward head  PALPATION: Diffuse palpable tenderness over bil neck, upper traps,  thoracic, lumbar and  gluteals with trigger points.    CERVICAL ROM:   Active ROM A/PROM (deg) eval  Flexion 55  Extension 40  Right lateral flexion 30  Left lateral flexion 35  Right rotation 50  Left rotation 40   (Blank rows = not tested)  UPPER EXTREMITY ROM: UE A/ROM is limited by 20% into flexion and abduction with pain at end range.  Hip flexibility is limited by 25% in all directions with pain in all directions.  UPPER EXTREMITY MMT: UE: 4+/5, LE 4+/5 except hip flexors 4-/5  TODAY'S TREATMENT:  Date: 11/09/21 Seated hamstring stretch: 3x20 seconds  Figure 4 seated: 3x20 seconds  Standing rockerboard: x 3 min Cat/cow with tactile cues for thoracic extension Open book :x10 bil  Thread the needle: x3 each side Manual therapy: elongation to bil upper traps, cervical paraspinals and lumbar paraspinals. Date: 11/03/21 Review of all HEP previously issued. PT educated pt to choose 3-5 exercises and perform 3x/day Manual therapy: elongation to bil upper traps, cervical paraspinals and lumbar paraspinals.  PATIENT EDUCATION:  Education details: Access Code: [email or web address] (review from previous session). Discussed strategies to incorporate more movement into her day considering her depression.  Pt will work to implement these strategies.   Person educated: Patient Education method: Explanation, Demonstration, and Handouts Education comprehension: verbalized understanding and returned demonstration   HOME EXERCISE PROGRAM: Access Code: [email or web address]  ASSESSMENT:  CLINICAL IMPRESSION: Pt arrived with Lt LE and neck/upper pain today.  Pt did well with participation in gentle flexibility exercises. Pt required tactile and verbal cues to improve thoracic segmental mobility without substitution with exercises.  Pt has established a plan to become more active with her grandkids and with exercises. Pt demonstrates improved mood today.  Patient will benefit from skilled PT to address the below impairments and  improve overall function.    OBJECTIVE IMPAIRMENTS decreased activity tolerance, decreased endurance, difficulty walking, decreased strength, increased muscle spasms, impaired flexibility, improper body mechanics, postural dysfunction, and pain.   ACTIVITY LIMITATIONS carrying, lifting, sitting, standing, reach over head, hygiene/grooming, and locomotion level  PARTICIPATION LIMITATIONS: meal prep, cleaning, laundry, driving, community activity, and yard work  PERSONAL FACTORS Past/current experiences, Time since onset of injury/illness/exacerbation, and 3+ comorbidities: fibromyalgia depression, anxiety,   are also affecting patient's functional outcome.   REHAB POTENTIAL: Good  CLINICAL DECISION MAKING: Evolving/moderate complexity  EVALUATION COMPLEXITY: Moderate   GOALS: Goals reviewed with patient? Yes  SHORT TERM GOALS: Target date: 12/01/2021   Return to regular performance of HEP 2-3x/day to improve mobility  Baseline: not performing HEP Goal status: In progress  2.  Report > or = to 25% reduction in cervical and lumbar pain with ADLs and self-care  Baseline: constant and 3-4/10  Goal status: INITIAL  3.  Reduce fibromyalgia impact score to < or = to 39  Baseline: 49 Goal status: INITIAL    LONG TERM GOALS: Target date: 12/29/2021  Be independent in advanced HEP Baseline: not currently exercising  Goal status: INITIAL  2.  Reduce fibromyalgia impact questionnaire to < or = to 29 Baseline: 49 Goal status: INITIAL  3.  Lift and carry laundry and her grandchildren with > or = to 50% reduction in pain Baseline: 7/10 Goal status: INITIAL  4.  Report > or = to 50% reduction in neck and lumbar pain with ADLs and self-care  Baseline: 3-4/10 constant pain  Goal status: INITIAL  5.  Return to yardwork verbalize appropriate rest breaks and mechanics  Baseline:  Goal status: INITIAL   PLAN: PT FREQUENCY: 2x/week  PT DURATION: 8 weeks  PLANNED INTERVENTIONS:  Therapeutic exercises, Therapeutic activity, Neuromuscular re-education, Balance training, Gait training, Patient/Family education, Self Care, Joint mobilization, Aquatic Therapy, Dry Needling, Spinal manipulation, Spinal mobilization, Cryotherapy, Moist heat, Taping, Traction, Manual therapy, and Re-evaluation  PLAN FOR NEXT SESSION: resume PT for manual to address myofascial pain, aquatics and progression of activity.     Sigurd Sos, PT 11/09/21 10:59 AM   [facility] [address], [address] [place], [place] [address] Phone # [phone] Fax [phone]

## 2021-11-16 ENCOUNTER — Ambulatory Visit: Payer: Medicare Other

## 2021-11-16 ENCOUNTER — Encounter: Payer: Self-pay | Admitting: Psychiatry

## 2021-11-16 ENCOUNTER — Ambulatory Visit (INDEPENDENT_AMBULATORY_CARE_PROVIDER_SITE_OTHER): Payer: Medicare Other | Admitting: Psychiatry

## 2021-11-16 VITALS — BP 114/70 | HR 85

## 2021-11-16 DIAGNOSIS — F4001 Agoraphobia with panic disorder: Secondary | ICD-10-CM | POA: Diagnosis not present

## 2021-11-16 DIAGNOSIS — F5105 Insomnia due to other mental disorder: Secondary | ICD-10-CM

## 2021-11-16 DIAGNOSIS — F902 Attention-deficit hyperactivity disorder, combined type: Secondary | ICD-10-CM

## 2021-11-16 DIAGNOSIS — F5081 Binge eating disorder: Secondary | ICD-10-CM

## 2021-11-16 DIAGNOSIS — F331 Major depressive disorder, recurrent, moderate: Secondary | ICD-10-CM

## 2021-11-16 DIAGNOSIS — F411 Generalized anxiety disorder: Secondary | ICD-10-CM | POA: Diagnosis not present

## 2021-11-16 DIAGNOSIS — F428 Other obsessive-compulsive disorder: Secondary | ICD-10-CM

## 2021-11-16 DIAGNOSIS — R293 Abnormal posture: Secondary | ICD-10-CM

## 2021-11-16 DIAGNOSIS — M797 Fibromyalgia: Secondary | ICD-10-CM | POA: Diagnosis not present

## 2021-11-16 DIAGNOSIS — M353 Polymyalgia rheumatica: Secondary | ICD-10-CM

## 2021-11-16 DIAGNOSIS — M542 Cervicalgia: Secondary | ICD-10-CM

## 2021-11-16 DIAGNOSIS — R252 Cramp and spasm: Secondary | ICD-10-CM

## 2021-11-16 DIAGNOSIS — M6281 Muscle weakness (generalized): Secondary | ICD-10-CM

## 2021-11-16 NOTE — Progress Notes (Signed)
Danielle Bruce [id number] 12/10/1960 61 y.o.   Subjective:   Patient ID:  Danielle Bruce is a 61 y.o. (DOB 1960-06-10) female.  Chief Complaint:  Chief Complaint  Patient presents with   Follow-up   Anxiety   Depression   ADHD    HPI [name] presents for follow-up of multiple dxes.  visit in April and taken off lamotrigine DT possible skin reaction.  Had appt with [facility] dermatology and they assume that skin reaction with itching was related to lamotrigine. 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 Per oncology most likely drug is lamotrigine. And she weaned off of the medication to determine if the cutaneous lesions are related. Lesions seem less without lamotrigine.  At this point skin reaction is presumed related to lamotrigine.  No other disease sx. She is well aware that she has been on lamotrigine for many years with good results for depression control. There is a risk of relapse as she comes off of the medication and she is worried about that. Smart phone helps her cognitive  And other productivity.    seen October 2020.  Restarted low dosage ADD bc concerns over STM, concentration and productivity.  Ritalin 5 mg twice daily.  Also suggested she retry N-acetylcysteine for mild cognitive complaints.   seen April 07, 2019 the following was noted: Mostly good.  Anxiety is mostly better, but depression is a little worse seasonally.  Not able to go help her daughter.   Low energy but does have motivation.  Ritalin helps energy and focus but doesn't take it regularly bc irregular sleep cycle.   Ativan only when having medical issues.    Therapy working on trauma issues is very hard.  Struggling with the fact she doesn't have memory of large parts of her past.   No manic sx off mood stabilizers.  Covid isolation reduce stressors overall and not  prominently depressed.   No meds were changed.  June 25, 2019 appointment the following is noted: F real worried about Covid despite vaccination.   Started fluvoxamine 25 on May 14, 2019 from PCP mainly for inflammation but possibly thinking she might be depressed. Seen benefit for energy and sleep pattern and more appropriate feelings and less numb.  Now realizes she had been depressed for awhile but just numb with depression.  Anniversaries of deaths of friends including last BF [name] a little over a year ago.   Thinks of him daily. His 59 yo D died of unknown cause recently.  Stress D [place] to beach. Wonders about increasing the fluvoxamine  [name] living in [place] and hasn't seen him in a year. [name] on disability for depression. Patient reports more depression.  Plan: Realized lately she's depressed and started Luvox again for it and for the anti-inflammatory potential for her health.  Has seen some benefit.   Prior benefit at 25 mg daily.  Optiion increase but slightly bc med sensitive. AGREE increase fluvoxamine to 37.5 mg daily.  08/06/2019 appointment with the following noted: Increased fluvoxamine to 37.5 mg and it helped the depression. Could see a big difference and had been more depressed than she even realized. Outlook better and sleep pattern normalized.    More energy and motivation.   But also started having prolonged HA.  HA lasted a couple of weeks.   Assumed HA was DT increased fluvoxamine so reduced to 25 mg on  07/28/19.  07/29/19 Episode of confusion occurred also.  Went to doctor while confused.  They couldn't determine cause.    Resolved in 1 day. FM flare up since fall/winter. To beach with daughter 3 mos minimum to give her shots for IVF treatment. Patient reports panic recently and recent difficulty with anxiety.    Occ flashbacks.   Patient denies difficulty with sleep initiation or maintenance. Denies appetite disturbance.  Patient reports that energy and motivation  have been good.  Patient denies any difficulty with concentration.  Patient denies any suicidal ideation. Plan: AGREE increase fluvoxamine to 37.5 mg daily once HA is controlled for several weekcs.  01/01/20 appt with the following noted: Increased fluvoxamine for awhile but got more HA and reduced it. Anxiety is better in general now.  But is interested in increasing it DT coming winter. When anxiety is not good it catches her off guard. Likely had Covid in August but early test was negative.  Got prednisone but sick for 5 weeks.  Lost taste and smell still going on.  D also had Covid.  Felt really sick and terrified with bad mental health at the time.  Was traumatic for her.  Had been vaccinated.   Had panic attack at the ER and took a long time to get the Ativan.  D moving back to area and pt will have to help with childcare so hopes to more aggressively treat her health problems. 2 gkids and 2 more coming. [name] married without kids yet. Plan: started Luvox again for it and for the anti-inflammatory potential for her health.  Has seen some benefit.   Prior benefit at 25 mg daily.  Optiion increase but slightly bc med sensitive. AGREE increase fluvoxamine to 37.5 mg daily once HA is controlled for several weeks.  03/03/2020 appointment with the following noted: On Day 10 of Covid.  2nd bout. A lot easier this time. Had gotten Luvox up to 50 mg daily and tolerated.  Then doubled it to 100 mg daily. Wonders about the proper dose for maintenance.   No taste and smell, exhausted, congestion and mild cough.  Energy is better than it was in the beginning.  Made her depressed and anxious again and that part is better also.  Last time had to go to ER with Covid.  Only ER visit in 2-3 years.  Less migraine than in the past. Overall was doing better with last couple months.  Trying to do anti-inflammatory diet.  Vegetarian, no dairy or gluten.  Natural sugars.   Still some anxiety. D moved back here and  pregnant with 2nd set of twins. Current twins are 59 yo.   Will restart trauma therapy and it's helped over the last couple of years.  More freedom from things.      Less daily dread with anxiety but still some panic which can be triggered.  Less OCD with fluvoxamine unless triggered.   Taking Ritalin 10 mg each AM and tolerating and benefiting. occ NM with few dreams overall. Plan: started Luvox again for it and for the anti-inflammatory potential for her health.  Has seen some benefit.   Prior benefit at 25 mg daily.  Med sensitive. AGREE increase fluvoxamine to  50  mg daily. Up to 100 mg daily if desired.   04/22/19 appt with following noted: Mostly good and randomly psycho.  Really busy with Colletta [place] [redacted] weeks pregnant with twins and twins 59 yo at home.  She's helping and her H is  away. Mo older and slower.  Pt overworking.  When with Colletta [place] kids interfere with sleep.  Doesn't eat as well with Colletta [place] affects her FM and energy.   Recently exhausted and needed rest but couldn't go home DT D had to go to hospital.  Her H critical at times and she lost it emotionally over this. Almost suicidal reaction with weird intrusive thoughts in response to the criticism.   Got a break and it helped.   Rheum adjusted Lyrica to BID. Had accident fall with one of babies and was bleeding.  She became hysterical.   Upset now that she couldn't control her emotions at the time.  Couldn't get herself under control to deal with the crisis.    Overall doing ok but doesn't handle stressors well and lose it.   Plan: AGREE with increase fluvoxamine to  50  mg daily. Up to 100 mg daily if desired.  06/23/2020 appointment with the following noted: Increased fluvoxamine 25 AM and 50 mg PM and tolerated it.  Been on this dose awhile.  Going to [place] May 6-19 and son taking her.  Working for [employer] and been successful. Also going to [place], [place].  Excited.   Occ overwhelmed. Starting therapy with Lonn Georgia and  looking forward to it and dealing with trauma stuff. Nervous about doing the therapy and getting decompensated like what happened last time. Prednisone hellps mood and body. Not as depressed as in the past.  Sleep is reasonably good.  Appetite is normal.  Anxiety intermittent.  No suicidal thoughts Plan: No med changes  08/19/2020 appointment with the following noted: Not good.  Dog died 2 week ago and was very close to her for 16 years.  She had been a great comfort during losses and health problems.  Dog helped her through periods of SI. Exhausted. [name] in [place] and going in early July. Wonders how long this will last.  Also increased fluvoxamine and now out and having withdrawal. Disc difference between grief and depression. Gwyndolyn Saxon needs therapist who takes Medicaid. Plan: no med changes  01/10/21 appt noted: Had covid 3rd time. Still on fluvox 50, Ritalin 10 mg daily, Deplin, trazodone 50 mg HS. Helping with gkids and been sick a lot since September. Been doing so so overall.  At times is good. Son-in-law will be getting raise and D can quit work for a year or so. [name] and his wife in [place] and that's good. Parents getting old. Both parents hearing problems and M more forgetful. Parents personality different.  Less therapy lately bc schedules.  Has dealt with a lot of trauma and will resume after new year and feels it has been helpful.  04/13/21 appt noted: More problems with appetite increase for a couple of years.  Always hungry.  Wonders about changing stimulant for this.  Weight up too much. Needs to lose 15+ # DT chronic pain including hips and knees. More depression.  Seasonally.  Episodic panic and anxiety chronic. Some intermittent conflict with daughter.   Needs Ativan intermittently. Still working through trauma issues. Continues therapy.   Haven't gotten back into church.  Not wanting to go out much. Plan: Benefit  Ritalin 10 mg AM and noon for cognitive and ADD  reasons and disc risk of palpitations.  Per request ok trial Vyvanse for binge eating and ADD instead of Ritalin per PCP suggestion May also help mood and weight loss should help pain. Caution it could cause anxiety  06/06/2021 appointment with the following  noted: Did get a prescription for Vyvanse 30 mg on 04/28/2021 filled.  Tried that in place of Ritalin. Better energy and focus with Vyvanse and less binge eating and lost 7-8 #.  Helped lose prednisone weight from November. Seems to wear off in 5-6 hours.   In afternoon when it wears off just wants to sit around.  No sig SE. Thinks she is still a little depressed and not enjoying things like she should. Chronic colitis with recent flare.   To [place] in May for a couple of weeks with son and his wife. Plan: Per request ok trial Vyvanse for binge eating and ADD and increase  to 40 mg daily per PCP suggestion  08/11/2021 appointment with the following noted: Tolerating increase Vyvanse 40 AM.  No SE.   Less crash with it and longer duration. Lost 14#.  Less craving.   Still kind of depressed.  B moved back after divorce.  F mad about it.  M health problems. Home more stressful.   Went to [place]. M acting odd and F cognitive problems. Poor sleep at home bc temp is so warm for parents. OC sx a little worse but not severe. Plan: Better with Vyvanse for binge eating and ADD and increase  to 40 mg daily per PCP suggestion May also help mood and weight loss should help pain. Continue fluvoxamine 50 mg twice daily  11/16/2021 appointment noted: Helping with gkids. Son in Sports coach still working out of state.  F not doing well with cognition and falls. 61yo. Handling stressors pretty well.  When irritable then takes a break. Enjoys grandkids 4 of them. D therapist and not working right now other than taking care of kids. Restarted PT which has helped in the past with pain and limitations physically. Starting therapy with Caroline Sauger end of  October. sTill taking vyvanse 40 and fluvoxamine 50 mg BID. Has missed some Vyvanse bc some days sleeping late. More productive with Vyvanse 40 mg AM, but still procrastinates. Can read again better and sustain attention better but not as productive as she would like. Still a fair amount of anxiety easily triggered anxiety at times and hopes therapy will help.  Claustrophobic easily with panic triggered. Depression is OK lately.  Past psych meds: Lithium, carbamazepine, oxcarbazepine, topiramate, valproic acid, lamotrigine, gabapentin  Zyprexa, Seroquel, Latuda headaches, Geodon,  Risperidone, Stelazine,  Rexulti,  Wellbutrin with side effects,  sertraline irritability, duloxetine,  fluvoxamine 100 with Covid, fluoxetine, Stopped desipramine DT skin issues which now resolved.  It helped GI px.  buspirone side effects,   N-acetylcysteine,  pramipexole,  methylphenidate,  Adderall,  Vyvanse 40 Ambien with amnesia, trazodone, Ativan  Review of Systems:  Review of Systems  HENT:  Negative for congestion.   Gastrointestinal:  Positive for abdominal pain. Negative for nausea.  Musculoskeletal:  Positive for back pain and myalgias.  Neurological:  Positive for headaches. Negative for tremors and weakness.  Psychiatric/Behavioral:  Positive for dysphoric mood. The patient is nervous/anxious.   Less HA with new meds.  Medications: I have reviewed the patient's current medications.  Current Outpatient Medications  Medication Sig Dispense Refill   albuterol (PROVENTIL HFA;VENTOLIN HFA) 108 (90 Base) MCG/ACT inhaler Inhale 2 puffs into the lungs every 6 (six) hours as needed for wheezing or shortness of breath.     azelastine (ASTELIN) 0.1 % nasal spray Place into both nostrils 2 (two) times daily. Use in each nostril as directed     Azelastine HCl 0.15 % SOLN  Place 2 sprays into both nostrils daily.     BEPREVE 1.5 % SOLN Place 1 drop into both eyes daily.      Calcium Carb-Cholecalciferol  (CALCIUM 1000 + D) 1000-20 MG-MCG TABS Take by mouth.     Coenzyme Q10 (CO Q 10 PO) Take by mouth daily.     cyclobenzaprine (FLEXERIL) 10 MG tablet Take 10 mg by mouth at bedtime as needed.     diphenhydrAMINE (BENADRYL) 25 mg capsule Take 50 mg by mouth every 6 (six) hours as needed (HEADACHES).     Erenumab-aooe 70 MG/ML SOAJ Inject into the skin every 28 (twenty-eight) days.     fluticasone (FLONASE) 50 MCG/ACT nasal spray Place 2 sprays into both nostrils daily. 16 g 0   fluvoxaMINE (LUVOX) 50 MG tablet TAKE 1 TABLET(50 MG) BY MOUTH TWICE DAILY 180 tablet 1   hydrocortisone (ANUSOL-HC) 25 MG suppository 1 suppository     ibuprofen (ADVIL) 800 MG tablet 1 tablet with food or milk as needed     L-methylfolate Calcium 15 MG TABS TAKE 1 TABLET BY MOUTH DAILY 30 tablet 11   levocetirizine (XYZAL) 5 MG tablet Take 5 mg by mouth every evening.  5   lisdexamfetamine (VYVANSE) 40 MG capsule Take 1 capsule (40 mg total) by mouth daily. 30 capsule 0   LORazepam (ATIVAN) 1 MG tablet Take 1 tablet (1 mg total) by mouth every 8 (eight) hours as needed for anxiety. (takes 2 mg when having a medical procedure.) 30 tablet 1   methocarbamol (ROBAXIN) 500 MG tablet Take 1 tablet (500 mg total) by mouth daily as needed for muscle spasms. 30 tablet 1   montelukast (SINGULAIR) 10 MG tablet Take 10 mg by mouth at bedtime.     NURTEC 75 MG TBDP Take 1 tablet by mouth daily as needed.     oxyCODONE-acetaminophen (PERCOCET/ROXICET) 5-325 MG per tablet Take 2 tablets by mouth daily as needed for moderate pain or severe pain (mighraine.).      polyethylene glycol (MIRALAX / GLYCOLAX) packet Take 17 g by mouth daily as needed for mild constipation.     pravastatin (PRAVACHOL) 40 MG tablet 1 tablet     pregabalin (LYRICA) 50 MG capsule Take 100 mg by mouth at bedtime. 1 tab in am and 1 tab HS.     PROLIA 60 MG/ML SOSY injection BRING TO THE OFFICE FOR INJECTION ON 02/15/2018 AS DIRECTED ONCE EVERY 6 MONTHS      promethazine (PHENERGAN) 25 MG tablet Take 25 mg by mouth every 6 (six) hours as needed for nausea.     traZODone (DESYREL) 50 MG tablet TAKE 1 TABLET(50 MG) BY MOUTH AT BEDTIME 90 tablet 1   TURMERIC PO Take by mouth daily.     VASCEPA 1 g CAPS TK 2 CS PO BID WC  3   VITAMIN D PO Take by mouth. Take $RemoveBef'5000mg'[id number]$  daily     VOLTAREN 1 % GEL Apply 2 g topically 4 (four) times daily as needed (JOINT PAIN).   3   Wheat Dextrin (BENEFIBER PO) 1 Tablespoon     Ascorbic Acid (VITAMIN C) 100 MG tablet Take 100 mg by mouth daily.     EPINEPHrine 0.3 mg/0.3 mL IJ SOAJ injection      lisdexamfetamine (VYVANSE) 40 MG capsule Take 1 capsule (40 mg total) by mouth every morning. 30 capsule 0   LORazepam (ATIVAN) 2 MG/ML concentrated solution Take 0.5 mLs (1 mg total) by mouth every 8 (eight) hours as  needed for anxiety (severe panic). 30 mL 1   No current facility-administered medications for this visit.    Medication Side Effects: None  Allergies:  Allergies  Allergen Reactions   Sulfa Antibiotics Nausea And Vomiting    Causes pt to vomit blood Other reaction(s): vomiting blood   Gluten Meal Other (See Comments)    Sensitivity.    Lactose Intolerance (Gi) Nausea And Vomiting   Aspirin     Other reaction(s): colitis   Bromfed Dm [Pseudoeph-Bromphen-Dm]     Other reaction(s): strung out and hallucinating   Chlorpromazine     Other reaction(s): Dark thoughts   Desipramine     Other reaction(s): rash   Gabapentin     Other reaction(s): swelling / fuzzy vision   Hydrocodone     Other reaction(s): causes hangovers   Lamotrigine     Other reaction(s): rash   Metronidazole     Other reaction(s): vomiting blood   Morphine Sulfate     Other reaction(s): tearful   Nsaids     Other reaction(s): colitis   Flagyl [Metronidazole Hcl] Nausea And Vomiting   Morphine And Related Other (See Comments)    Reaction: Depression, emotional    Past Medical History:  Diagnosis Date   Anemia    Anxiety     Arthritis    osteoarthritis   ASCUS (atypical squamous cells of undetermined significance) on Pap smear 05/06/2005   NEG HIGH RISK HPV--C&B BIOPSY BENIGN 12/2005   Asthma    Bipolar 2 disorder ([facility])    Cancer ([facility])    skin cancer - basal cell   Colon polyps    Complication of anesthesia    anxious afterwards, will get headaches    Constipation    Depression    Fibromyalgia 10/2013   GERD (gastroesophageal reflux disease)    Hearing loss on left    Heart murmur    never had any problems   Hemorrhoids    High cholesterol    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   cytology negative   IBS (irritable bowel syndrome)    Insomnia    Lymphocytic colitis    Lymphoma (HCC)    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   Bone marrow biopsy showes 8% plasma cells IgA Lambda   Migraines    Osteoarthritis    Osteopenia    Peripheral neuropathy    PTSD (post-traumatic stress disorder)     Family History  Problem Relation Age of Onset   Diabetes Father    Hypertension Father    Hyperlipidemia Father    Depression Daughter    Anxiety disorder Daughter    Asthma Daughter    Heart disease Maternal Grandfather    Heart disease Paternal Grandmother    Heart disease Paternal Grandfather    Depression Son    Depression Son    Anxiety disorder Son    Breast cancer Neg Hx     Social History   Socioeconomic History   Marital status: Divorced    Spouse name: Not on file   Number of children: 3   Years of education: Not on file   Highest education level: Not on file  Occupational History   Not on file  Tobacco Use   Smoking status: Former    Types: Cigarettes    Quit date: 05/13/2014    Years since quitting: 7.5   Smokeless tobacco: Never   Tobacco comments:    social   Vaping Use   Vaping Use:  Never used  Substance and Sexual Activity   Alcohol use: Yes    Alcohol/week: 0.0 standard drinks of alcohol    Comment: rare   Drug use: Never   Sexual activity:  Not Currently    Comment: -1st intercourse 61 yo-More than 5 partners  Other Topics Concern   Not on file  Social History Narrative   Not on file   Social Determinants of Health   Financial Resource Strain: Not on file  Food Insecurity: Not on file  Transportation Needs: Not on file  Physical Activity: Not on file  Stress: Not on file  Social Connections: Not on file  Intimate Partner Violence: Not on file    Past Medical History, Surgical history, Social history, and Family history were reviewed and updated as appropriate.   Please see review of systems for further details on the patient's review from today.   Objective:   Physical Exam:  BP 114/70   Pulse 85   LMP 12/28/2010   Physical Exam Constitutional:      General: She is not in acute distress. Musculoskeletal:        General: No deformity.  Neurological:     Mental Status: She is alert and oriented to person, place, and time.     Coordination: Coordination normal.  Psychiatric:        Attention and Perception: Attention and perception normal. She does not perceive auditory or visual hallucinations.        Mood and Affect: Mood is anxious and depressed. Affect is not labile, angry, tearful or inappropriate.        Speech: Speech normal.        Behavior: Behavior normal.        Thought Content: Thought content normal. Thought content is not paranoid or delusional. Thought content does not include homicidal or suicidal ideation. Thought content does not include suicidal plan.        Cognition and Memory: Cognition and memory normal.        Judgment: Judgment normal.     Comments: Insight intact Depression not severe, but situational and better Affect better than last visit.      Lab Review:     Component Value Date/Time   NA 142 08/01/2016 1713   NA 144 07/12/2015 1521   K 4.7 08/01/2016 1713   K 3.8 07/12/2015 1521   CL 107 08/01/2016 1713   CO2 23 08/01/2016 1713   CO2 27 07/12/2015 1521   GLUCOSE  95 08/01/2016 1713   GLUCOSE 82 07/12/2015 1521   BUN 14 08/01/2016 1713   BUN 10.3 07/12/2015 1521   CREATININE 0.87 08/01/2016 1713   CREATININE 0.8 07/12/2015 1521   CALCIUM 10.0 08/01/2016 1713   CALCIUM 9.9 07/12/2015 1521   PROT 7.4 08/01/2016 1713   PROT 7.4 07/12/2015 1521   ALBUMIN 4.8 08/01/2016 1713   ALBUMIN 4.4 07/12/2015 1521   AST 23 08/01/2016 1713   AST 22 07/12/2015 1521   ALT 26 08/01/2016 1713   ALT 31 07/12/2015 1521   ALKPHOS 55 08/01/2016 1713   ALKPHOS 54 07/12/2015 1521   BILITOT 0.7 08/01/2016 1713   BILITOT 0.40 07/12/2015 1521   GFRNONAA 76 08/01/2016 1713   GFRAA 88 08/01/2016 1713       Component Value Date/Time   WBC 5.9 08/01/2016 1713   RBC 4.71 08/01/2016 1713   HGB 14.6 08/01/2016 1713   HGB 14.1 07/12/2015 1521   HCT 43.1 08/01/2016 1713   HCT 41.5  07/12/2015 1521   PLT 254 08/01/2016 1713   PLT 248 07/12/2015 1521   MCV 91.5 08/01/2016 1713   MCV 92.2 07/12/2015 1521   MCH 31.0 08/01/2016 1713   MCHC 33.9 08/01/2016 1713   RDW 13.2 08/01/2016 1713   RDW 13.3 07/12/2015 1521   LYMPHSABS 2,006 08/01/2016 1713   LYMPHSABS 1.8 07/12/2015 1521   MONOABS 354 08/01/2016 1713   MONOABS 0.7 07/12/2015 1521   EOSABS 118 08/01/2016 1713   EOSABS 0.1 07/12/2015 1521   BASOSABS 0 08/01/2016 1713   BASOSABS 0.0 07/12/2015 1521    No results found for: "POCLITH", "LITHIUM"   No results found for: "PHENYTOIN", "PHENOBARB", "VALPROATE", "CBMZ"   .res Assessment: Plan:    Harvest was seen today for follow-up, anxiety, depression and adhd.  Diagnoses and all orders for this visit:  Major depressive disorder, recurrent episode, moderate (HCC)  Attention deficit hyperactivity disorder (ADHD), combined type  Binge-eating disorder, moderate  Obsessional thoughts  Generalized anxiety disorder  Panic disorder with agoraphobia  Insomnia due to mental condition   Past couple of years more obvious PTSD obvious has called into doubt the  bipolar dix bc she's remained mood stable off the lamotrigine.    She has had symptoms consistent with hypomania in the past but there is now some question as to whether they were PTSD related rather than truly bipolar 2.  Her son has had treatment resistant depression with possible bipolar disorder and her daughter has been diagnosed with bipolar disorder but also a history of borderline personality disorder.  So there is a family history of the spectrum.  started Luvox again for it and for the anti-inflammatory potential for her health.  Has seen some benefit.   Prior benefit fluvoxamine. Taking fluvoxamine 50 mg BID Clear benefit from fluvoxamine for mood quite significantly. No manic sx.  HA are better.   Stress management for mood sx. Health problems worsen mood episodically.   Disc stress son's mental health problems.  CBT around agoraphobia.  Need to push herself to get out more.  She plans to start with Bambi Cottle.  We discussed the short-term risks associated with benzodiazepines including sedation and increased fall risk among others.  Discussed long-term side effect risk including dependence, potential withdrawal symptoms, and the potential eventual dose-related risk of dementia.  But recent studies from 2020 dispute this association between benzodiazepines and dementia risk. Newer studies in 2020 do not support an association with dementia.   Keep this at a minimum given cognitive concerns.  She only uses it when triggered with anxiety usually medical.  She is not using it regularly. Continue Ativan prn 1 mg for panic and triggers.  Discussed potential benefits, risks, and side effects of stimulants with patient to include increased heart rate, palpitations, insomnia, increased anxiety, increased irritability, or decreased appetite.  Instructed patient to contact office if experiencing any significant tolerability issues.  Better with Vyvanse for binge eating and ADD at 40 mg daily  per PCP suggestion May also help mood and weight loss should help pain.  Caution it could cause anxiety and other SE Disc pending generic.  Disc dosing options and risk of skipping higher doses will be more noticeably problematic.  FU 3 mos  Lynder Parents, MD, DFAPA    Please see After Visit Summary for patient specific instructions.  Future Appointments  Date Time Provider [place]  11/18/2021  3:15 PM Altamese Dilling, PTA [facility] None  11/22/2021 12:30 PM Takacs, Craig Guess,  PT [facility] None  11/28/2021  3:30 PM Danie Binder, PT [facility] None  11/30/2021 11:45 AM Altamese Dilling, PTA [facility] None  12/05/2021 11:45 AM Danie Binder, PT [facility] None  12/12/2021 11:45 AM Danie Binder, PT [facility] None  12/14/2021 11:45 AM Altamese Dilling, PTA [facility] None  12/19/2021 11:45 AM Danie Binder, PT [facility] None  12/26/2021 11:45 AM Danie Binder, PT [facility] None  12/28/2021 11:45 AM Altamese Dilling, PTA [facility] None  12/29/2021  9:00 AM Cottle, Bambi G, LCSW [facility] None  12/29/2021 11:45 AM Danie Binder, PT [facility] None  01/03/2022 11:00 AM Cottle, Bambi G, LCSW [facility] None  01/11/2022 11:00 AM Cottle, Bambi G, LCSW [facility] None  01/17/2022 11:00 AM Cottle, Bambi G, LCSW [facility] None  01/24/2022 10:00 AM Cottle, Bambi G, LCSW [facility] None  01/31/2022 11:00 AM Cottle, Bambi G, LCSW [facility] None    No orders of the defined types were placed in this encounter.      -------------------------------

## 2021-11-16 NOTE — Therapy (Signed)
OUTPATIENT PHYSICAL THERAPY TREATMENT   Patient Name: Danielle Bruce MRN: [id number] DOB:1961-02-10, 61 y.o., female Today's Date: 11/16/2021   PT End of Session - 11/16/21 1220     Visit Number 3    Date for PT Re-Evaluation 12/29/21    Authorization Type Medicare B- needs KX modifier now    Progress Note Due on Visit 10    PT Start Time 1147    PT Stop Time 1219    PT Time Calculation (min) 32 min    Activity Tolerance Patient tolerated treatment well    Behavior During Therapy [facility] for tasks assessed/performed               Past Medical History:  Diagnosis Date   Anemia    Anxiety    Arthritis    osteoarthritis   ASCUS (atypical squamous cells of undetermined significance) on Pap smear 05/06/2005   NEG HIGH RISK HPV--C&B BIOPSY BENIGN 12/2005   Asthma    Bipolar 2 disorder ([place])    Cancer ([place])    skin cancer - basal cell   Colon polyps    Complication of anesthesia    anxious afterwards, will get headaches    Constipation    Depression    Fibromyalgia 10/2013   GERD (gastroesophageal reflux disease)    Hearing loss on left    Heart murmur    never had any problems   Hemorrhoids    High cholesterol    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   cytology negative   IBS (irritable bowel syndrome)    Insomnia    Lymphocytic colitis    Lymphoma ([facility])    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   Bone marrow biopsy showes 8% plasma cells IgA Lambda   Migraines    Osteoarthritis    Osteopenia    Peripheral neuropathy    PTSD (post-traumatic stress disorder)    Past Surgical History:  Procedure Laterality Date   BONE MARROW BIOPSY Left 12/18/2013   Plasma cell dyscrasia 8% population of plasma cells   BREAST BIOPSY Right    benign stereo   CESAREAN SECTION  13,24,40   CHOLECYSTECTOMY N/A 10/16/2012   Procedure: LAPAROSCOPIC CHOLECYSTECTOMY;  Surgeon: Joyice Faster. Cornett, MD;  Location: [facility];  Service: General;  Laterality: N/A;    COLONOSCOPY     numerous times   DILATION AND CURETTAGE OF UTERUS     ESOPHAGOGASTRODUODENOSCOPY     HEMORRHOID SURGERY  1993   x3   IUD REMOVAL  02/2015   Mirena   LAPAROSCOPIC LYSIS OF ADHESIONS N/A 10/16/2012   Procedure: LAPAROSCOPIC LYSIS OF ADHESIONS;  Surgeon: Marcello Moores A. Cornett, MD;  Location: [facility];  Service: General;  Laterality: N/A;   LAPAROSCOPY N/A 10/16/2012   Procedure: LAPAROSCOPY DIAGNOSTIC;  Surgeon: Joyice Faster. Cornett, MD;  Location: [facility];  Service: General;  Laterality: N/A;   PELVIC LAPAROSCOPY     RADIOLOGY WITH ANESTHESIA N/A 12/16/2015   Procedure: MRI OF BRAIN WITH AND WITHOUT CONTRAST;  Surgeon: Medication Radiologist, MD;  Location: [place];  Service: Radiology;  Laterality: N/A;   SHOULDER SURGERY  2007/2008   SPINE SURGERY  2010   cervical   Patient Active Problem List   Diagnosis Date Noted   GAD (generalized anxiety disorder) 12/26/2017   OCD (obsessive compulsive disorder) 12/26/2017   PTSD (post-traumatic stress disorder) 12/26/2017   DDD (degenerative disc disease), cervical 07/14/2016   Primary osteoarthritis of both feet 07/14/2016   Primary osteoarthritis of  both hands 07/14/2016   Other fatigue 07/14/2016   History of IBS 07/14/2016   Osteopenia of multiple sites 07/14/2016   Fibromyalgia 01/13/2016   MGUS (monoclonal gammopathy of unknown significance) 12/23/2013   Chronic cholecystitis without calculus 10/18/2012   Abdominal pain, unspecified site 10/18/2012   Nausea alone 10/18/2012   Unspecified constipation 10/18/2012   Depression 10/18/2012   Anxiety    ASCUS (atypical squamous cells of undetermined significance) on Pap smear    IUD    Hemorrhoids 11/29/2010   Abdominal pain, left upper quadrant 11/29/2010    PCP:  Mayra Neer, MD  REFERRING PROVIDER: Mayra Neer, MD  REFERRING DIAG: fibromyalgia  THERAPY DIAG:  Muscle weakness (generalized)  Abnormal posture  Cramp and spasm  Cervicalgia  Polymyalgia rheumatica  ([place])  Rationale for Evaluation and Treatment Rehabilitation  ONSET DATE: chronic pain (fibromyalgia) with flare-up 2 months ago  SUBJECTIVE:                                                                                                                                                                                                         SUBJECTIVE STATEMENT: I'm hurting in my neck and upper traps and back of my Lt leg.  Overall, pain is better by 20% since the start of care. I have been very busy helping with my dad and my grand kids.    PERTINENT HISTORY:  Anxiety, bipolar disorder, depression, fibromyalgia, IBS, migraines, osteopenia, PTSD  PAIN:  Are you having pain? Yes: NPRS scale: 3/10 now, up to 6/10 on "bad days" Pain location: neck and upper back, hands, legs (Lt>Rt) Pain description: sore, sharp, annoyig Aggravating factors: activity, yard work Relieving factors: muscle relaxers, rest, stretching  PRECAUTIONS: Other: chronic pain syndrome and depression Other: slow progression with exercise due to chronic condition  WEIGHT BEARING RESTRICTIONS No  FALLS:  Has patient fallen in last 6 months? No  LIVING ENVIRONMENT: Lives with: lives with their family Lives in: House/apartment  OCCUPATION: on disability  PLOF: Independent with basic ADLs and Leisure: yardwork, walking  Pt cares for her 19 young grandchildren- difficulty with care including lifting and carrying  PATIENT GOALS be more active with less pain, exercise at the gym regularly, lift and carry grandchildren and laundry, sleep with fewer interruptions, get back to more gardening.  OBJECTIVE:   DIAGNOSTIC FINDINGS: none recent   PATIENT SURVEYS: Fibromyalgia Impact Questionnaire: first 2 sections only:   COGNITION: Overall cognitive status: Within functional limits for tasks assessed  SENSATION: WFL  POSTURE: rounded shoulders and forward head  PALPATION: Diffuse  palpable tenderness over bil  neck, upper traps, thoracic, lumbar and gluteals with trigger points.    CERVICAL ROM:   Active ROM A/PROM (deg) eval  Flexion 55  Extension 40  Right lateral flexion 30  Left lateral flexion 35  Right rotation 50  Left rotation 40   (Blank rows = not tested)  UPPER EXTREMITY ROM: UE A/ROM is limited by 20% into flexion and abduction with pain at end range.  Hip flexibility is limited by 25% in all directions with pain in all directions.  UPPER EXTREMITY MMT: UE: 4+/5, LE 4+/5 except hip flexors 4-/5  TODAY'S TREATMENT:  Date: 11/16/21 Trigger Point Dry-Needling  Treatment instructions: Expect mild to moderate muscle soreness. S/S of pneumothorax if dry needled over a lung field, and to seek immediate medical attention should they occur. Patient verbalized understanding of these instructions and education.  Patient Consent Given: Yes Education handout provided: Previously provided Muscles treated: Lt upper trap, rhomboids and  lower cervical and upper thoracic multifidi Treatment response/outcome: Utilized skilled palpation to identify trigger points.  During dry needling able to palpate muscle twitch and muscle elongation   Skilled palpation and monitoring by PT during dry needling  Manual therapy: elongation to bil upper traps, cervical paraspinals and lumbar paraspinals. Addaday to Lt gluteals and hamstrings   Date: 11/09/21 Seated hamstring stretch: 3x20 seconds  Figure 4 seated: 3x20 seconds  Standing rockerboard: x 3 min Cat/cow with tactile cues for thoracic extension Open book :x10 bil  Thread the needle: x3 each side Manual therapy: elongation to bil upper traps, cervical paraspinals and lumbar paraspinals. Date: 11/03/21 Review of all HEP previously issued. PT educated pt to choose 3-5 exercises and perform 3x/day Manual therapy: elongation to bil upper traps, cervical paraspinals and lumbar paraspinals.  PATIENT EDUCATION:  Education details: Access Code:  [email or web address] (review from previous session). Discussed strategies to incorporate more movement into her day considering her depression.  Pt will work to implement these strategies.   Person educated: Patient Education method: Explanation, Demonstration, and Handouts Education comprehension: verbalized understanding and returned demonstration   HOME EXERCISE PROGRAM: Access Code: [email or web address]  ASSESSMENT:  CLINICAL IMPRESSION: Pt has been very busy  over the past week with care of her dad and grandchildren.  Pt reports 20% overall improvement in symptoms and is working to become more consistent with HEP for flexibility and strength.  Pt with trigger points in Lt upper trap and musculature at the medial scapular border and had good response to DN and manual therapy with improved tissue mobility.  Pt was tender over Lt gluteals and hamstrings and was sore after use of Addaday.  Pt agreed to continue stretching to help combat soreness.   Patient will benefit from skilled PT to address the below impairments and improve overall function.    OBJECTIVE IMPAIRMENTS decreased activity tolerance, decreased endurance, difficulty walking, decreased strength, increased muscle spasms, impaired flexibility, improper body mechanics, postural dysfunction, and pain.   ACTIVITY LIMITATIONS carrying, lifting, sitting, standing, reach over head, hygiene/grooming, and locomotion level  PARTICIPATION LIMITATIONS: meal prep, cleaning, laundry, driving, community activity, and yard work  PERSONAL FACTORS Past/current experiences, Time since onset of injury/illness/exacerbation, and 3+ comorbidities: fibromyalgia depression, anxiety,   are also affecting patient's functional outcome.   REHAB POTENTIAL: Good  CLINICAL DECISION MAKING: Evolving/moderate complexity  EVALUATION COMPLEXITY: Moderate   GOALS: Goals reviewed with patient? Yes  SHORT TERM GOALS: Target date: 12/01/2021   Return to regular performance of HEP  2-3x/day to improve  mobility  Baseline: not performing HEP Goal status: In progress  2.  Report > or = to 25% reduction in cervical and lumbar pain with ADLs and self-care  Baseline: constant and 3-4/10  Goal status: INITIAL  3.  Reduce fibromyalgia impact score to < or = to 39  Baseline: 49 Goal status: INITIAL    LONG TERM GOALS: Target date: 12/29/2021  Be independent in advanced HEP Baseline: not currently exercising  Goal status: INITIAL  2.  Reduce fibromyalgia impact questionnaire to < or = to 29 Baseline: 49 Goal status: INITIAL  3.  Lift and carry laundry and her grandchildren with > or = to 50% reduction in pain Baseline: 7/10 Goal status: INITIAL  4.  Report > or = to 50% reduction in neck and lumbar pain with ADLs and self-care  Baseline: 3-4/10 constant pain  Goal status: INITIAL  5.  Return to yardwork verbalize appropriate rest breaks and mechanics  Baseline:  Goal status: INITIAL   PLAN: PT FREQUENCY: 2x/week  PT DURATION: 8 weeks  PLANNED INTERVENTIONS: Therapeutic exercises, Therapeutic activity, Neuromuscular re-education, Balance training, Gait training, Patient/Family education, Self Care, Joint mobilization, Aquatic Therapy, Dry Needling, Spinal manipulation, Spinal mobilization, Cryotherapy, Moist heat, Taping, Traction, Manual therapy, and Re-evaluation  PLAN FOR NEXT SESSION: resume PT for manual to address myofascial pain, aquatics and progression of activity.     Sigurd Sos, PT 11/16/21 12:25 PM   [facility] [address] [place], [place] [address] Phone # [phone] Fax [phone]

## 2021-11-18 ENCOUNTER — Encounter: Payer: Self-pay | Admitting: Physical Therapy

## 2021-11-18 ENCOUNTER — Ambulatory Visit: Payer: Medicare Other | Admitting: Physical Therapy

## 2021-11-18 DIAGNOSIS — M542 Cervicalgia: Secondary | ICD-10-CM

## 2021-11-18 DIAGNOSIS — R252 Cramp and spasm: Secondary | ICD-10-CM

## 2021-11-18 DIAGNOSIS — R293 Abnormal posture: Secondary | ICD-10-CM

## 2021-11-18 DIAGNOSIS — M6281 Muscle weakness (generalized): Secondary | ICD-10-CM

## 2021-11-18 DIAGNOSIS — M79672 Pain in left foot: Secondary | ICD-10-CM

## 2021-11-18 DIAGNOSIS — M79671 Pain in right foot: Secondary | ICD-10-CM

## 2021-11-18 DIAGNOSIS — M797 Fibromyalgia: Secondary | ICD-10-CM | POA: Diagnosis not present

## 2021-11-18 DIAGNOSIS — M353 Polymyalgia rheumatica: Secondary | ICD-10-CM

## 2021-11-18 NOTE — Therapy (Signed)
OUTPATIENT PHYSICAL THERAPY TREATMENT   Patient Name: Danielle Bruce MRN: [id number] DOB:1960-11-23, 61 y.o., female Today's Date: 11/18/2021   PT End of Session - 11/18/21 1214     Visit Number 4    Date for PT Re-Evaluation 12/29/21    Authorization Type Medicare B- needs KX modifier now    Progress Note Due on Visit 10    PT Start Time 1213    PT Stop Time 1304    PT Time Calculation (min) 51 min    Activity Tolerance Patient tolerated treatment well    Behavior During Therapy [facility] for tasks assessed/performed                Past Medical History:  Diagnosis Date   Anemia    Anxiety    Arthritis    osteoarthritis   ASCUS (atypical squamous cells of undetermined significance) on Pap smear 05/06/2005   NEG HIGH RISK HPV--C&B BIOPSY BENIGN 12/2005   Asthma    Bipolar 2 disorder ([place])    Cancer ([place])    skin cancer - basal cell   Colon polyps    Complication of anesthesia    anxious afterwards, will get headaches    Constipation    Depression    Fibromyalgia 10/2013   GERD (gastroesophageal reflux disease)    Hearing loss on left    Heart murmur    never had any problems   Hemorrhoids    High cholesterol    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   cytology negative   IBS (irritable bowel syndrome)    Insomnia    Lymphocytic colitis    Lymphoma (HCC)    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   Bone marrow biopsy showes 8% plasma cells IgA Lambda   Migraines    Osteoarthritis    Osteopenia    Peripheral neuropathy    PTSD (post-traumatic stress disorder)    Past Surgical History:  Procedure Laterality Date   BONE MARROW BIOPSY Left 12/18/2013   Plasma cell dyscrasia 8% population of plasma cells   BREAST BIOPSY Right    benign stereo   CESAREAN SECTION  49,67,59   CHOLECYSTECTOMY N/A 10/16/2012   Procedure: LAPAROSCOPIC CHOLECYSTECTOMY;  Surgeon: Joyice Faster. Cornett, MD;  Location: [facility];  Service: General;  Laterality: N/A;    COLONOSCOPY     numerous times   DILATION AND CURETTAGE OF UTERUS     ESOPHAGOGASTRODUODENOSCOPY     HEMORRHOID SURGERY  1993   x3   IUD REMOVAL  02/2015   Mirena   LAPAROSCOPIC LYSIS OF ADHESIONS N/A 10/16/2012   Procedure: LAPAROSCOPIC LYSIS OF ADHESIONS;  Surgeon: Marcello Moores A. Cornett, MD;  Location: [facility];  Service: General;  Laterality: N/A;   LAPAROSCOPY N/A 10/16/2012   Procedure: LAPAROSCOPY DIAGNOSTIC;  Surgeon: Joyice Faster. Cornett, MD;  Location: [facility];  Service: General;  Laterality: N/A;   PELVIC LAPAROSCOPY     RADIOLOGY WITH ANESTHESIA N/A 12/16/2015   Procedure: MRI OF BRAIN WITH AND WITHOUT CONTRAST;  Surgeon: Medication Radiologist, MD;  Location: [place];  Service: Radiology;  Laterality: N/A;   SHOULDER SURGERY  2007/2008   SPINE SURGERY  2010   cervical   Patient Active Problem List   Diagnosis Date Noted   GAD (generalized anxiety disorder) 12/26/2017   OCD (obsessive compulsive disorder) 12/26/2017   PTSD (post-traumatic stress disorder) 12/26/2017   DDD (degenerative disc disease), cervical 07/14/2016   Primary osteoarthritis of both feet 07/14/2016   Primary osteoarthritis  of both hands 07/14/2016   Other fatigue 07/14/2016   History of IBS 07/14/2016   Osteopenia of multiple sites 07/14/2016   Fibromyalgia 01/13/2016   MGUS (monoclonal gammopathy of unknown significance) 12/23/2013   Chronic cholecystitis without calculus 10/18/2012   Abdominal pain, unspecified site 10/18/2012   Nausea alone 10/18/2012   Unspecified constipation 10/18/2012   Depression 10/18/2012   Anxiety    ASCUS (atypical squamous cells of undetermined significance) on Pap smear    IUD    Hemorrhoids 11/29/2010   Abdominal pain, left upper quadrant 11/29/2010    PCP:  Mayra Neer, MD  REFERRING PROVIDER: Mayra Neer, MD  REFERRING DIAG: fibromyalgia  THERAPY DIAG:  Muscle weakness (generalized)  Abnormal posture  Cramp and spasm  Cervicalgia  Polymyalgia rheumatica  (HCC)  Pain in right foot  Pain in left foot  Rationale for Evaluation and Treatment Rehabilitation  ONSET DATE: chronic pain (fibromyalgia) with flare-up 2 months ago  SUBJECTIVE:                                                                                                                                                                                                         SUBJECTIVE STATEMENT: I think my body is ready to start moving again.   PERTINENT HISTORY:  Anxiety, bipolar disorder, depression, fibromyalgia, IBS, migraines, osteopenia, PTSD  PAIN:  Are you having pain? Yes: NPRS scale: 3/10 now, up to 6/10 on "bad days" Pain location: neck and upper back, hands, legs (Lt>Rt) Pain description: sore, sharp, annoyig Aggravating factors: activity, yard work Relieving factors: muscle relaxers, rest, stretching  PRECAUTIONS: Other: chronic pain syndrome and depression Other: slow progression with exercise due to chronic condition  WEIGHT BEARING RESTRICTIONS No  FALLS:  Has patient fallen in last 6 months? No  LIVING ENVIRONMENT: Lives with: lives with their family Lives in: House/apartment  OCCUPATION: on disability  PLOF: Independent with basic ADLs and Leisure: yardwork, walking  Pt cares for her 78 young grandchildren- difficulty with care including lifting and carrying  PATIENT GOALS be more active with less pain, exercise at the gym regularly, lift and carry grandchildren and laundry, sleep with fewer interruptions, get back to more gardening.  OBJECTIVE:   DIAGNOSTIC FINDINGS: none recent   PATIENT SURVEYS: Fibromyalgia Impact Questionnaire: first 2 sections only:   COGNITION: Overall cognitive status: Within functional limits for tasks assessed  SENSATION: WFL  POSTURE: rounded shoulders and forward head  PALPATION: Diffuse palpable tenderness over bil neck, upper traps, thoracic, lumbar and gluteals with trigger points.    CERVICAL ROM:  Active ROM A/PROM (deg) eval  Flexion 55  Extension 40  Right lateral flexion 30  Left lateral flexion 35  Right rotation 50  Left rotation 40   (Blank rows = not tested)  UPPER EXTREMITY ROM: UE A/ROM is limited by 20% into flexion and abduction with pain at end range.  Hip flexibility is limited by 25% in all directions with pain in all directions.  UPPER EXTREMITY MMT: UE: 4+/5, LE 4+/5 except hip flexors 4-/5  TODAY'S TREATMENT:    11/18/21:Pt arrives for aquatic physical therapy. Treatment took place in 3.5-5.5 feet of water. Water temperature was 91 degrees F. Pt entered the pool via stairs and light use of rails. Pt requires buoyancy of water for support and to offload joints with strengthening exercises.   Seated water bench with 75% submersion Pt performed seated LE AROM exercises 20x in all planes, concurrent discussion of pain/current status. Semi sit shoulder flex/ext gentle UE movement 2x10, fingers open. 75% depth water walking 4 length in each direction no AD. Single buoy UE weight shoulder horizontal add/abd 10x, push weights down by side hold 3 sec 10x, against the wall blue noodle lat press 10x Vc to contract TA and glutes. Underwater bicycle 1 min 3x with horseback decompression 30 sec I inbetween sets.   Date: 11/16/21 Trigger Point Dry-Needling  Treatment instructions: Expect mild to moderate muscle soreness. S/S of pneumothorax if dry needled over a lung field, and to seek immediate medical attention should they occur. Patient verbalized understanding of these instructions and education.  Patient Consent Given: Yes Education handout provided: Previously provided Muscles treated: Lt upper trap, rhomboids and  lower cervical and upper thoracic multifidi Treatment response/outcome: Utilized skilled palpation to identify trigger points.  During dry needling able to palpate muscle twitch and muscle elongation   Skilled palpation and monitoring by PT during dry  needling  Manual therapy: elongation to bil upper traps, cervical paraspinals and lumbar paraspinals. Addaday to Lt gluteals and hamstrings   Date: 11/09/21 Seated hamstring stretch: 3x20 seconds  Figure 4 seated: 3x20 seconds  Standing rockerboard: x 3 min Cat/cow with tactile cues for thoracic extension Open book :x10 bil  Thread the needle: x3 each side Manual therapy: elongation to bil upper traps, cervical paraspinals and lumbar paraspinals. Date: 11/03/21 Review of all HEP previously issued. PT educated pt to choose 3-5 exercises and perform 3x/day Manual therapy: elongation to bil upper traps, cervical paraspinals and lumbar paraspinals.  PATIENT EDUCATION:  Education details: Access Code: [email or web address] (review from previous session). Discussed strategies to incorporate more movement into her day considering her depression.  Pt will work to implement these strategies.   Person educated: Patient Education method: Explanation, Demonstration, and Handouts Education comprehension: verbalized understanding and returned demonstration   HOME EXERCISE PROGRAM: Access Code: [email or web address]  ASSESSMENT:  CLINICAL IMPRESSION: Pt arrives back to aquatic PT very motivated to exercise in the water. Pt continues to experience low pain when moving in the water.   OBJECTIVE IMPAIRMENTS decreased activity tolerance, decreased endurance, difficulty walking, decreased strength, increased muscle spasms, impaired flexibility, improper body mechanics, postural dysfunction, and pain.   ACTIVITY LIMITATIONS carrying, lifting, sitting, standing, reach over head, hygiene/grooming, and locomotion level  PARTICIPATION LIMITATIONS: meal prep, cleaning, laundry, driving, community activity, and yard work  PERSONAL FACTORS Past/current experiences, Time since onset of injury/illness/exacerbation, and 3+ comorbidities: fibromyalgia depression, anxiety,   are also affecting patient's functional outcome.   REHAB  POTENTIAL: Good  CLINICAL DECISION MAKING: Evolving/moderate complexity  EVALUATION  COMPLEXITY: Moderate   GOALS: Goals reviewed with patient? Yes  SHORT TERM GOALS: Target date: 12/01/2021   Return to regular performance of HEP 2-3x/day to improve mobility  Baseline: not performing HEP Goal status: In progress  2.  Report > or = to 25% reduction in cervical and lumbar pain with ADLs and self-care  Baseline: constant and 3-4/10  Goal status: INITIAL  3.  Reduce fibromyalgia impact score to < or = to 39  Baseline: 49 Goal status: INITIAL    LONG TERM GOALS: Target date: 12/29/2021  Be independent in advanced HEP Baseline: not currently exercising  Goal status: INITIAL  2.  Reduce fibromyalgia impact questionnaire to < or = to 29 Baseline: 49 Goal status: INITIAL  3.  Lift and carry laundry and her grandchildren with > or = to 50% reduction in pain Baseline: 7/10 Goal status: INITIAL  4.  Report > or = to 50% reduction in neck and lumbar pain with ADLs and self-care  Baseline: 3-4/10 constant pain  Goal status: INITIAL  5.  Return to yardwork verbalize appropriate rest breaks and mechanics  Baseline:  Goal status: INITIAL   PLAN: PT FREQUENCY: 2x/week  PT DURATION: 8 weeks  PLANNED INTERVENTIONS: Therapeutic exercises, Therapeutic activity, Neuromuscular re-education, Balance training, Gait training, Patient/Family education, Self Care, Joint mobilization, Aquatic Therapy, Dry Needling, Spinal manipulation, Spinal mobilization, Cryotherapy, Moist heat, Taping, Traction, Manual therapy, and Re-evaluation  PLAN FOR NEXT SESSION: resume PT for manual to address myofascial pain, aquatics and progression of activity.     Myrene Galas, PTA 11/18/21 3:21 PM  [facility] [address] [place], [place] [address] Phone # [phone] Fax [phone]

## 2021-11-22 ENCOUNTER — Ambulatory Visit: Payer: Medicare Other

## 2021-11-22 DIAGNOSIS — R252 Cramp and spasm: Secondary | ICD-10-CM

## 2021-11-22 DIAGNOSIS — M542 Cervicalgia: Secondary | ICD-10-CM

## 2021-11-22 DIAGNOSIS — M6281 Muscle weakness (generalized): Secondary | ICD-10-CM

## 2021-11-22 DIAGNOSIS — M797 Fibromyalgia: Secondary | ICD-10-CM | POA: Diagnosis not present

## 2021-11-22 DIAGNOSIS — R293 Abnormal posture: Secondary | ICD-10-CM

## 2021-11-22 NOTE — Therapy (Signed)
OUTPATIENT PHYSICAL THERAPY TREATMENT   Patient Name: Danielle Bruce MRN: [id number] DOB:03-25-1960, 61 y.o., female Today's Date: 11/22/2021   PT End of Session - 11/22/21 1309     Visit Number 5    Date for PT Re-Evaluation 12/29/21    Authorization Type Medicare B- needs KX modifier now    Progress Note Due on Visit 10    PT Start Time 1240    PT Stop Time 1312    PT Time Calculation (min) 32 min    Activity Tolerance Patient tolerated treatment well    Behavior During Therapy [facility] for tasks assessed/performed                 Past Medical History:  Diagnosis Date   Anemia    Anxiety    Arthritis    osteoarthritis   ASCUS (atypical squamous cells of undetermined significance) on Pap smear 05/06/2005   NEG HIGH RISK HPV--C&B BIOPSY BENIGN 12/2005   Asthma    Bipolar 2 disorder ([place])    Cancer ([place])    skin cancer - basal cell   Colon polyps    Complication of anesthesia    anxious afterwards, will get headaches    Constipation    Depression    Fibromyalgia 10/2013   GERD (gastroesophageal reflux disease)    Hearing loss on left    Heart murmur    never had any problems   Hemorrhoids    High cholesterol    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   cytology negative   IBS (irritable bowel syndrome)    Insomnia    Lymphocytic colitis    Lymphoma (HCC)    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   Bone marrow biopsy showes 8% plasma cells IgA Lambda   Migraines    Osteoarthritis    Osteopenia    Peripheral neuropathy    PTSD (post-traumatic stress disorder)    Past Surgical History:  Procedure Laterality Date   BONE MARROW BIOPSY Left 12/18/2013   Plasma cell dyscrasia 8% population of plasma cells   BREAST BIOPSY Right    benign stereo   CESAREAN SECTION  35,70,17   CHOLECYSTECTOMY N/A 10/16/2012   Procedure: LAPAROSCOPIC CHOLECYSTECTOMY;  Surgeon: Joyice Faster. Cornett, MD;  Location: [facility];  Service: General;  Laterality: N/A;    COLONOSCOPY     numerous times   DILATION AND CURETTAGE OF UTERUS     ESOPHAGOGASTRODUODENOSCOPY     HEMORRHOID SURGERY  1993   x3   IUD REMOVAL  02/2015   Mirena   LAPAROSCOPIC LYSIS OF ADHESIONS N/A 10/16/2012   Procedure: LAPAROSCOPIC LYSIS OF ADHESIONS;  Surgeon: Marcello Moores A. Cornett, MD;  Location: [facility];  Service: General;  Laterality: N/A;   LAPAROSCOPY N/A 10/16/2012   Procedure: LAPAROSCOPY DIAGNOSTIC;  Surgeon: Joyice Faster. Cornett, MD;  [facility];  Service: General;  Laterality: N/A;   PELVIC LAPAROSCOPY     RADIOLOGY WITH ANESTHESIA N/A 12/16/2015   Procedure: MRI OF BRAIN WITH AND WITHOUT CONTRAST;  Surgeon: Medication Radiologist, MD;  Location: [place];  Service: Radiology;  Laterality: N/A;   SHOULDER SURGERY  2007/2008   SPINE SURGERY  2010   cervical   Patient Active Problem List   Diagnosis Date Noted   GAD (generalized anxiety disorder) 12/26/2017   OCD (obsessive compulsive disorder) 12/26/2017   PTSD (post-traumatic stress disorder) 12/26/2017   DDD (degenerative disc disease), cervical 07/14/2016   Primary osteoarthritis of both feet 07/14/2016   Primary  osteoarthritis of both hands 07/14/2016   Other fatigue 07/14/2016   History of IBS 07/14/2016   Osteopenia of multiple sites 07/14/2016   Fibromyalgia 01/13/2016   MGUS (monoclonal gammopathy of unknown significance) 12/23/2013   Chronic cholecystitis without calculus 10/18/2012   Abdominal pain, unspecified site 10/18/2012   Nausea alone 10/18/2012   Unspecified constipation 10/18/2012   Depression 10/18/2012   Anxiety    ASCUS (atypical squamous cells of undetermined significance) on Pap smear    IUD    Hemorrhoids 11/29/2010   Abdominal pain, left upper quadrant 11/29/2010    PCP:  Mayra Neer, MD  REFERRING PROVIDER: Mayra Neer, MD  REFERRING DIAG: fibromyalgia  THERAPY DIAG:  Muscle weakness (generalized)  Abnormal posture  Cramp and spasm  Cervicalgia  Rationale for  Evaluation and Treatment Rehabilitation  ONSET DATE: chronic pain (fibromyalgia) with flare-up 2 months ago  SUBJECTIVE:                                                                                                                                                                                                         SUBJECTIVE STATEMENT: I have more movement in my neck after needling last session.  It took me a few days to recover.  Today is not a good day.  I woke up with pain in my Rt gluteals.  PERTINENT HISTORY:  Anxiety, bipolar disorder, depression, fibromyalgia, IBS, migraines, osteopenia, PTSD  PAIN:  Are you having pain? Yes: NPRS scale: neck: 3/10, Lt gluteals: 8-9/10 prior to pain meds and now 3-4/10 Pain location: neck and upper back, hands, legs (Lt>Rt) Pain description: sore, sharp, annoying Aggravating factors: activity, yard work Relieving factors: muscle relaxers, rest, stretching  PRECAUTIONS: Other: chronic pain syndrome and depression Other: slow progression with exercise due to chronic condition  WEIGHT BEARING RESTRICTIONS No  FALLS:  Has patient fallen in last 6 months? No  LIVING ENVIRONMENT: Lives with: lives with their family Lives in: House/apartment  OCCUPATION: on disability  PLOF: Independent with basic ADLs and Leisure: yardwork, walking  Pt cares for her 61 young grandchildren- difficulty with care including lifting and carrying  PATIENT GOALS be more active with less pain, exercise at the gym regularly, lift and carry grandchildren and laundry, sleep with fewer interruptions, get back to more gardening.  OBJECTIVE:   DIAGNOSTIC FINDINGS: none recent   PATIENT SURVEYS: Fibromyalgia Impact Questionnaire: first 2 sections only:   COGNITION: Overall cognitive status: Within functional limits for tasks assessed  SENSATION: WFL  POSTURE: rounded shoulders and forward head  PALPATION: Diffuse palpable tenderness  over bil neck, upper  traps, thoracic, lumbar and gluteals with trigger points.    CERVICAL ROM:   Active ROM A/PROM (deg) eval  Flexion 55  Extension 40  Right lateral flexion 30  Left lateral flexion 35  Right rotation 50  Left rotation 40   (Blank rows = not tested)  UPPER EXTREMITY ROM: UE A/ROM is limited by 20% into flexion and abduction with pain at end range.  Hip flexibility is limited by 25% in all directions with pain in all directions.  UPPER EXTREMITY MMT: UE: 4+/5, LE 4+/5 except hip flexors 4-/5  TODAY'S TREATMENT:  Date: 11/22/21 Seated hamstring stretch and figure 4 stretch 3x20 seconds  Single knee to chest: 3x20 seconds and diagonal knee to chest: 3x20 seconds  Manual therapy: elongation to Lt gluteals for release   11/18/21:Pt arrives for aquatic physical therapy. Treatment took place in 3.5-5.5 feet of water. Water temperature was 91 degrees F. Pt entered the pool via stairs and light use of rails. Pt requires buoyancy of water for support and to offload joints with strengthening exercises.   Seated water bench with 75% submersion Pt performed seated LE AROM exercises 20x in all planes, concurrent discussion of pain/current status. Semi sit shoulder flex/ext gentle UE movement 2x10, fingers open. 75% depth water walking 4 length in each direction no AD. Single buoy UE weight shoulder horizontal add/abd 10x, push weights down by side hold 3 sec 10x, against the wall blue noodle lat press 10x Vc to contract TA and glutes. Underwater bicycle 1 min 3x with horseback decompression 30 sec I inbetween sets.   Date: 11/16/21 Trigger Point Dry-Needling  Treatment instructions: Expect mild to moderate muscle soreness. S/S of pneumothorax if dry needled over a lung field, and to seek immediate medical attention should they occur. Patient verbalized understanding of these instructions and education.  Patient Consent Given: Yes Education handout provided: Previously provided Muscles treated: Lt  upper trap, rhomboids and  lower cervical and upper thoracic multifidi Treatment response/outcome: Utilized skilled palpation to identify trigger points.  During dry needling able to palpate muscle twitch and muscle elongation   Skilled palpation and monitoring by PT during dry needling  Manual therapy: elongation to bil upper traps, cervical paraspinals and lumbar paraspinals. Addaday to Lt gluteals and hamstrings   Date: 11/09/21 Seated hamstring stretch: 3x20 seconds  Figure 4 seated: 3x20 seconds  Standing rockerboard: x 3 min Cat/cow with tactile cues for thoracic extension Open book :x10 bil  Thread the needle: x3 each side Manual therapy: elongation to bil upper traps, cervical paraspinals and lumbar paraspinals.  PATIENT EDUCATION:  Education details: Access Code: [email or web address] (review from previous session). Discussed strategies to incorporate more movement into her day considering her depression.  Pt will work to implement these strategies.   Person educated: Patient Education method: Explanation, Demonstration, and Handouts Education comprehension: verbalized understanding and returned demonstration   HOME EXERCISE PROGRAM: Access Code: [email or web address]  ASSESSMENT:  CLINICAL IMPRESSION: Pt had good response to DN to neck last session with resultant improved cervical A/ROM.  Pt woke today with Lt gluteal pain that was intense.  Session focused on gentle flexibility to gluteals and manual therapy to address tension and trigger points. Pt with reduced tension and pain after treatment today. Patient will benefit from skilled PT to address chronic pain and the the below impairments and improve overall function.   OBJECTIVE IMPAIRMENTS decreased activity tolerance, decreased endurance, difficulty walking, decreased strength, increased muscle spasms, impaired flexibility, improper body  mechanics, postural dysfunction, and pain.   ACTIVITY LIMITATIONS carrying, lifting, sitting, standing, reach  over head, hygiene/grooming, and locomotion level  PARTICIPATION LIMITATIONS: meal prep, cleaning, laundry, driving, community activity, and yard work  PERSONAL FACTORS Past/current experiences, Time since onset of injury/illness/exacerbation, and 3+ comorbidities: fibromyalgia depression, anxiety,   are also affecting patient's functional outcome.   REHAB POTENTIAL: Good  CLINICAL DECISION MAKING: Evolving/moderate complexity  EVALUATION COMPLEXITY: Moderate   GOALS: Goals reviewed with patient? Yes  SHORT TERM GOALS: Target date: 12/01/2021   Return to regular performance of HEP 2-3x/day to improve mobility  Baseline: not performing HEP Goal status: In progress  2.  Report > or = to 25% reduction in cervical and lumbar pain with ADLs and self-care  Baseline: constant and 3-4/10  Goal status: INITIAL  3.  Reduce fibromyalgia impact score to < or = to 39  Baseline: 49 Goal status: INITIAL    LONG TERM GOALS: Target date: 12/29/2021  Be independent in advanced HEP Baseline: not currently exercising  Goal status: INITIAL  2.  Reduce fibromyalgia impact questionnaire to < or = to 29 Baseline: 49 Goal status: INITIAL  3.  Lift and carry laundry and her grandchildren with > or = to 50% reduction in pain Baseline: 7/10 Goal status: INITIAL  4.  Report > or = to 50% reduction in neck and lumbar pain with ADLs and self-care  Baseline: 3-4/10 constant pain  Goal status: INITIAL  5.  Return to yardwork verbalize appropriate rest breaks and mechanics  Baseline:  Goal status: INITIAL   PLAN: PT FREQUENCY: 2x/week  PT DURATION: 8 weeks  PLANNED INTERVENTIONS: Therapeutic exercises, Therapeutic activity, Neuromuscular re-education, Balance training, Gait training, Patient/Family education, Self Care, Joint mobilization, Aquatic Therapy, Dry Needling, Spinal manipulation, Spinal mobilization, Cryotherapy, Moist heat, Taping, Traction, Manual therapy, and  Re-evaluation  PLAN FOR NEXT SESSION: resume PT for manual to address myofascial pain, aquatics and progression of activity.     Sigurd Sos, PT 11/22/21 1:15 PM   [facility] [address] [place], [place] [address] Phone # [phone] Fax [phone]

## 2021-11-28 ENCOUNTER — Ambulatory Visit: Payer: Medicare Other

## 2021-11-30 ENCOUNTER — Encounter: Payer: Self-pay | Admitting: Physical Therapy

## 2021-11-30 ENCOUNTER — Ambulatory Visit: Payer: Medicare Other | Attending: Family Medicine | Admitting: Physical Therapy

## 2021-11-30 DIAGNOSIS — M6281 Muscle weakness (generalized): Secondary | ICD-10-CM | POA: Insufficient documentation

## 2021-11-30 DIAGNOSIS — M542 Cervicalgia: Secondary | ICD-10-CM | POA: Insufficient documentation

## 2021-11-30 DIAGNOSIS — M353 Polymyalgia rheumatica: Secondary | ICD-10-CM | POA: Insufficient documentation

## 2021-11-30 DIAGNOSIS — R252 Cramp and spasm: Secondary | ICD-10-CM | POA: Insufficient documentation

## 2021-11-30 DIAGNOSIS — R293 Abnormal posture: Secondary | ICD-10-CM | POA: Insufficient documentation

## 2021-11-30 NOTE — Therapy (Signed)
OUTPATIENT PHYSICAL THERAPY TREATMENT   Patient Name: Danielle Bruce MRN: [id number] DOB:11-24-1960, 61 y.o., female Today's Date: 11/30/2021   PT End of Session - 11/30/21 1124     Visit Number 6    Date for PT Re-Evaluation 12/29/21    Authorization Type Medicare B- needs KX modifier now    Progress Note Due on Visit 10    PT Start Time 1123    PT Stop Time 1216    PT Time Calculation (min) 53 min    Activity Tolerance Patient tolerated treatment well    Behavior During Therapy [facility] for tasks assessed/performed                 Past Medical History:  Diagnosis Date   Anemia    Anxiety    Arthritis    osteoarthritis   ASCUS (atypical squamous cells of undetermined significance) on Pap smear 05/06/2005   NEG HIGH RISK HPV--C&B BIOPSY BENIGN 12/2005   Asthma    Bipolar 2 disorder ([place])    Cancer ([place])    skin cancer - basal cell   Colon polyps    Complication of anesthesia    anxious afterwards, will get headaches    Constipation    Depression    Fibromyalgia 10/2013   GERD (gastroesophageal reflux disease)    Hearing loss on left    Heart murmur    never had any problems   Hemorrhoids    High cholesterol    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   cytology negative   IBS (irritable bowel syndrome)    Insomnia    Lymphocytic colitis    Lymphoma ([facility])    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   Bone marrow biopsy showes 8% plasma cells IgA Lambda   Migraines    Osteoarthritis    Osteopenia    Peripheral neuropathy    PTSD (post-traumatic stress disorder)    Past Surgical History:  Procedure Laterality Date   BONE MARROW BIOPSY Left 12/18/2013   Plasma cell dyscrasia 8% population of plasma cells   BREAST BIOPSY Right    benign stereo   CESAREAN SECTION  28,20,60   CHOLECYSTECTOMY N/A 10/16/2012   Procedure: LAPAROSCOPIC CHOLECYSTECTOMY;  Surgeon: Joyice Faster. Cornett, MD;  Location: [facility];  Service: General;  Laterality: N/A;    COLONOSCOPY     numerous times   DILATION AND CURETTAGE OF UTERUS     ESOPHAGOGASTRODUODENOSCOPY     HEMORRHOID SURGERY  1993   x3   IUD REMOVAL  02/2015   Mirena   LAPAROSCOPIC LYSIS OF ADHESIONS N/A 10/16/2012   Procedure: LAPAROSCOPIC LYSIS OF ADHESIONS;  Surgeon: Marcello Moores A. Cornett, MD;  Location: [facility];  Service: General;  Laterality: N/A;   LAPAROSCOPY N/A 10/16/2012   Procedure: LAPAROSCOPY DIAGNOSTIC;  Surgeon: Joyice Faster. Cornett, MD;  Location: [facility];  Service: General;  Laterality: N/A;   PELVIC LAPAROSCOPY     RADIOLOGY WITH ANESTHESIA N/A 12/16/2015   Procedure: MRI OF BRAIN WITH AND WITHOUT CONTRAST;  Surgeon: Medication Radiologist, MD;  Location: [facility];  Service: Radiology;  Laterality: N/A;   SHOULDER SURGERY  2007/2008   SPINE SURGERY  2010   cervical   Patient Active Problem List   Diagnosis Date Noted   GAD (generalized anxiety disorder) 12/26/2017   OCD (obsessive compulsive disorder) 12/26/2017   PTSD (post-traumatic stress disorder) 12/26/2017   DDD (degenerative disc disease), cervical 07/14/2016   Primary osteoarthritis of both feet 07/14/2016   Primary  osteoarthritis of both hands 07/14/2016   Other fatigue 07/14/2016   History of IBS 07/14/2016   Osteopenia of multiple sites 07/14/2016   Fibromyalgia 01/13/2016   MGUS (monoclonal gammopathy of unknown significance) 12/23/2013   Chronic cholecystitis without calculus 10/18/2012   Abdominal pain, unspecified site 10/18/2012   Nausea alone 10/18/2012   Unspecified constipation 10/18/2012   Depression 10/18/2012   Anxiety    ASCUS (atypical squamous cells of undetermined significance) on Pap smear    IUD    Hemorrhoids 11/29/2010   Abdominal pain, left upper quadrant 11/29/2010    PCP:  Mayra Neer, MD  REFERRING PROVIDER: Mayra Neer, MD  REFERRING DIAG: fibromyalgia  THERAPY DIAG:  Muscle weakness (generalized)  Abnormal posture  Cramp and spasm  Cervicalgia  Rationale for  Evaluation and Treatment Rehabilitation  ONSET DATE: chronic pain (fibromyalgia) with flare-up 2 months ago  SUBJECTIVE:                                                                                                                                                                                                         SUBJECTIVE STATEMENT: The manual work last session helped my glute a lot. I have this pain in my Lt shoulder blade that sometimes radiates into my arm. I am thinking I might need to see my neurosurgeon for a check. I felt amazing after the pool.  PERTINENT HISTORY:  Anxiety, bipolar disorder, depression, fibromyalgia, IBS, migraines, osteopenia, PTSD  PAIN:  Are you having pain? Yes: NPRS scale: neck: 3/10,  Pain location: neck and upper back, hands, legs (Lt>Rt) Pain description: sore, sharp, annoying Aggravating factors: activity, yard work Relieving factors: muscle relaxers, rest, stretching  PRECAUTIONS: Other: chronic pain syndrome and depression Other: slow progression with exercise due to chronic condition  WEIGHT BEARING RESTRICTIONS No  FALLS:  Has patient fallen in last 6 months? No  LIVING ENVIRONMENT: Lives with: lives with their family Lives in: House/apartment  OCCUPATION: on disability  PLOF: Independent with basic ADLs and Leisure: yardwork, walking  Pt cares for her 68 young grandchildren- difficulty with care including lifting and carrying  PATIENT GOALS be more active with less pain, exercise at the gym regularly, lift and carry grandchildren and laundry, sleep with fewer interruptions, get back to more gardening.  OBJECTIVE:   DIAGNOSTIC FINDINGS: none recent   PATIENT SURVEYS: Fibromyalgia Impact Questionnaire: first 2 sections only:   COGNITION: Overall cognitive status: Within functional limits for tasks assessed  SENSATION: WFL  POSTURE: rounded shoulders and forward head  PALPATION: Diffuse palpable tenderness over bil  neck,  upper traps, thoracic, lumbar and gluteals with trigger points.    CERVICAL ROM:   Active ROM A/PROM (deg) eval  Flexion 55  Extension 40  Right lateral flexion 30  Left lateral flexion 35  Right rotation 50  Left rotation 40   (Blank rows = not tested)  UPPER EXTREMITY ROM: UE A/ROM is limited by 20% into flexion and abduction with pain at end range.  Hip flexibility is limited by 25% in all directions with pain in all directions.  UPPER EXTREMITY MMT: UE: 4+/5, LE 4+/5 except hip flexors 4-/5  TODAY'S TREATMENT:    Date: 11/30/21: Pt arrives for aquatic physical therapy. Treatment took place in 3.5-5.5 feet of water. Water temperature was 91 degrees F. Pt entered the pool via stairs and light use of rails. Pt requires buoyancy of water for support and to offload joints with strengthening exercises.  Pt utilizes viscosity of the water required for strengthening.  Seated water bench with 75% submersion Pt performed seated LE AROM exercises 20x in all planes, concurrent discussion of pain/current status. Semi sit shoulder flex/ext gentle UE movement 3x10, fingers closed 75% depth water walking 10 xlength in each direction no AD. Single buoy UE weight shoulder horizontal add/abd 10x, push weights down by side hold 3 sec 10x, against the wall blue noodle lat press 10x Vc to contract TA and glutes. Hip lifts in 3 directions 10x each Bil. Pt requires holding on to the side of pool to maintain balance.  Underwater bicycle 33mn 2x with horseback decompression 30 sec inbetween sets.      Date: 11/22/21 Seated hamstring stretch and figure 4 stretch 3x20 seconds  Single knee to chest: 3x20 seconds and diagonal knee to chest: 3x20 seconds  Manual therapy: elongation to Lt gluteals for release   11/18/21:Pt arrives for aquatic physical therapy. Treatment took place in 3.5-5.5 feet of water. Water temperature was 91 degrees F. Pt entered the pool via stairs and light use of rails. Pt requires  buoyancy of water for support and to offload joints with strengthening exercises.   Seated water bench with 75% submersion Pt performed seated LE AROM exercises 20x in all planes, concurrent discussion of pain/current status. Semi sit shoulder flex/ext gentle UE movement 2x10, fingers open. 75% depth water walking 4 length in each direction no AD. Single buoy UE weight shoulder horizontal add/abd 10x, push weights down by side hold 3 sec 10x, against the wall blue noodle lat press 10x Vc to contract TA and glutes. Underwater bicycle 1 min 3x with horseback decompression 30 sec I inbetween sets.   Date: 11/16/21 Trigger Point Dry-Needling  Treatment instructions: Expect mild to moderate muscle soreness. S/S of pneumothorax if dry needled over a lung field, and to seek immediate medical attention should they occur. Patient verbalized understanding of these instructions and education.  Patient Consent Given: Yes Education handout provided: Previously provided Muscles treated: Lt upper trap, rhomboids and  lower cervical and upper thoracic multifidi Treatment response/outcome: Utilized skilled palpation to identify trigger points.  During dry needling able to palpate muscle twitch and muscle elongation   Skilled palpation and monitoring by PT during dry needling  Manual therapy: elongation to bil upper traps, cervical paraspinals and lumbar paraspinals. Addaday to Lt gluteals and hamstrings   PATIENT EDUCATION:  Education details: Access Code: [email or web address](review from previous session). Discussed strategies to incorporate more movement into her day considering her depression.  Pt will work to implement these strategies.   Person educated:  Patient Education method: Explanation, Demonstration, and Handouts Education comprehension: verbalized understanding and returned demonstration   HOME EXERCISE PROGRAM: Access Code: [email or web address]  ASSESSMENT:  CLINICAL IMPRESSION: Pt arrives to aquatic PT with  complaints of LT shoulder blade pain. This pain was not exacerbated by any water exercises. Pt appears to get energized when exercising in the water.   OBJECTIVE IMPAIRMENTS decreased activity tolerance, decreased endurance, difficulty walking, decreased strength, increased muscle spasms, impaired flexibility, improper body mechanics, postural dysfunction, and pain.   ACTIVITY LIMITATIONS carrying, lifting, sitting, standing, reach over head, hygiene/grooming, and locomotion level  PARTICIPATION LIMITATIONS: meal prep, cleaning, laundry, driving, community activity, and yard work  PERSONAL FACTORS Past/current experiences, Time since onset of injury/illness/exacerbation, and 3+ comorbidities: fibromyalgia depression, anxiety,   are also affecting patient's functional outcome.   REHAB POTENTIAL: Good  CLINICAL DECISION MAKING: Evolving/moderate complexity  EVALUATION COMPLEXITY: Moderate   GOALS: Goals reviewed with patient? Yes  SHORT TERM GOALS: Target date: 12/01/2021   Return to regular performance of HEP 2-3x/day to improve mobility  Baseline: not performing HEP Goal status: In progress  2.  Report > or = to 25% reduction in cervical and lumbar pain with ADLs and self-care  Baseline: constant and 3-4/10  Goal status: INITIAL  3.  Reduce fibromyalgia impact score to < or = to 39  Baseline: 49 Goal status: INITIAL    LONG TERM GOALS: Target date: 12/29/2021  Be independent in advanced HEP Baseline: not currently exercising  Goal status: INITIAL  2.  Reduce fibromyalgia impact questionnaire to < or = to 29 Baseline: 49 Goal status: INITIAL  3.  Lift and carry laundry and her grandchildren with > or = to 50% reduction in pain Baseline: 7/10 Goal status: INITIAL  4.  Report > or = to 50% reduction in neck and lumbar pain with ADLs and self-care  Baseline: 3-4/10 constant pain  Goal status: INITIAL  5.  Return to yardwork verbalize appropriate rest breaks and  mechanics  Baseline:  Goal status: INITIAL   PLAN: PT FREQUENCY: 2x/week  PT DURATION: 8 weeks  PLANNED INTERVENTIONS: Therapeutic exercises, Therapeutic activity, Neuromuscular re-education, Balance training, Gait training, Patient/Family education, Self Care, Joint mobilization, Aquatic Therapy, Dry Needling, Spinal manipulation, Spinal mobilization, Cryotherapy, Moist heat, Taping, Traction, Manual therapy, and Re-evaluation  PLAN FOR NEXT SESSION: resume PT for manual to address myofascial pain, aquatics and progression of activity.     Myrene Galas, PTA 11/30/21 9:08 PM   [facility] [address], [address] [place], [place] [address] Phone # [phone] Fax [phone]

## 2021-12-05 ENCOUNTER — Ambulatory Visit: Payer: Medicare Other

## 2021-12-05 DIAGNOSIS — M6281 Muscle weakness (generalized): Secondary | ICD-10-CM

## 2021-12-05 DIAGNOSIS — M542 Cervicalgia: Secondary | ICD-10-CM | POA: Diagnosis not present

## 2021-12-05 DIAGNOSIS — R252 Cramp and spasm: Secondary | ICD-10-CM

## 2021-12-05 DIAGNOSIS — R293 Abnormal posture: Secondary | ICD-10-CM

## 2021-12-05 DIAGNOSIS — M353 Polymyalgia rheumatica: Secondary | ICD-10-CM | POA: Diagnosis not present

## 2021-12-05 NOTE — Therapy (Signed)
OUTPATIENT PHYSICAL THERAPY TREATMENT   Patient Name: Danielle Bruce MRN: [id number] DOB:March 09, 1960, 61 y.o., female Today's Date: 12/05/2021   PT End of Session - 12/05/21 1222     Visit Number 7    Date for PT Re-Evaluation 12/29/21    Authorization Type Medicare B- needs KX modifier now    Progress Note Due on Visit 10    PT Start Time 1150    PT Stop Time 1222    PT Time Calculation (min) 32 min    Activity Tolerance No increased pain    Behavior During Therapy WFL for tasks assessed/performed                  Past Medical History:  Diagnosis Date   Anemia    Anxiety    Arthritis    osteoarthritis   ASCUS (atypical squamous cells of undetermined significance) on Pap smear 05/06/2005   NEG HIGH RISK HPV--C&B BIOPSY BENIGN 12/2005   Asthma    Bipolar 2 disorder ([place])    Cancer ([place])    skin cancer - basal cell   Colon polyps    Complication of anesthesia    anxious afterwards, will get headaches    Constipation    Depression    Fibromyalgia 10/2013   GERD (gastroesophageal reflux disease)    Hearing loss on left    Heart murmur    never had any problems   Hemorrhoids    High cholesterol    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   cytology negative   IBS (irritable bowel syndrome)    Insomnia    Lymphocytic colitis    Lymphoma ([facility])    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   Bone marrow biopsy showes 8% plasma cells IgA Lambda   Migraines    Osteoarthritis    Osteopenia    Peripheral neuropathy    PTSD (post-traumatic stress disorder)    Past Surgical History:  Procedure Laterality Date   BONE MARROW BIOPSY Left 12/18/2013   Plasma cell dyscrasia 8% population of plasma cells   BREAST BIOPSY Right    benign stereo   CESAREAN SECTION  17,61,60   CHOLECYSTECTOMY N/A 10/16/2012   Procedure: LAPAROSCOPIC CHOLECYSTECTOMY;  Surgeon: Joyice Faster. Cornett, MD;  Location: [facility];  Service: General;  Laterality: N/A;   COLONOSCOPY      numerous times   DILATION AND CURETTAGE OF UTERUS     ESOPHAGOGASTRODUODENOSCOPY     HEMORRHOID SURGERY  1993   x3   IUD REMOVAL  02/2015   Mirena   LAPAROSCOPIC LYSIS OF ADHESIONS N/A 10/16/2012   Procedure: LAPAROSCOPIC LYSIS OF ADHESIONS;  Surgeon: Marcello Moores A. Cornett, MD;  Location: [facility];  Service: General;  Laterality: N/A;   LAPAROSCOPY N/A 10/16/2012   Procedure: LAPAROSCOPY DIAGNOSTIC;  Surgeon: Joyice Faster. Cornett, MD;  Location: [facility];  Service: General;  Laterality: N/A;   PELVIC LAPAROSCOPY     RADIOLOGY WITH ANESTHESIA N/A 12/16/2015   Procedure: MRI OF BRAIN WITH AND WITHOUT CONTRAST;  Surgeon: Medication Radiologist, MD;  Location: [facility];  Service: Radiology;  Laterality: N/A;   SHOULDER SURGERY  2007/2008   SPINE SURGERY  2010   cervical   Patient Active Problem List   Diagnosis Date Noted   GAD (generalized anxiety disorder) 12/26/2017   OCD (obsessive compulsive disorder) 12/26/2017   PTSD (post-traumatic stress disorder) 12/26/2017   DDD (degenerative disc disease), cervical 07/14/2016   Primary osteoarthritis of both feet 07/14/2016   Primary  osteoarthritis of both hands 07/14/2016   Other fatigue 07/14/2016   History of IBS 07/14/2016   Osteopenia of multiple sites 07/14/2016   Fibromyalgia 01/13/2016   MGUS (monoclonal gammopathy of unknown significance) 12/23/2013   Chronic cholecystitis without calculus 10/18/2012   Abdominal pain, unspecified site 10/18/2012   Nausea alone 10/18/2012   Unspecified constipation 10/18/2012   Depression 10/18/2012   Anxiety    ASCUS (atypical squamous cells of undetermined significance) on Pap smear    IUD    Hemorrhoids 11/29/2010   Abdominal pain, left upper quadrant 11/29/2010    PCP:  Mayra Neer, MD  REFERRING PROVIDER: Mayra Neer, MD  REFERRING DIAG: fibromyalgia  THERAPY DIAG:  Muscle weakness (generalized)  Abnormal posture  Cramp and spasm  Polymyalgia rheumatica ([place])  Rationale for  Evaluation and Treatment Rehabilitation  ONSET DATE: chronic pain (fibromyalgia) with flare-up 2 months ago  SUBJECTIVE:                                                                                                                                                                                                         SUBJECTIVE STATEMENT: Neck is worse today. Lt side of neck is painful and Lt arm is sore. Numbness and tingling in bil arms.    PERTINENT HISTORY:  Anxiety, bipolar disorder, depression, fibromyalgia, IBS, migraines, osteopenia, PTSD  PAIN:  Are you having pain? Yes: NPRS scale: neck: 4/10, up to 8-9/10 over the weekend.   Pain location: neck and upper back, hands, legs (Lt>Rt) Pain description: sore, sharp, annoying Aggravating factors: activity, yard work Relieving factors: muscle relaxers, rest, stretching  PRECAUTIONS: Other: chronic pain syndrome and depression Other: slow progression with exercise due to chronic condition  WEIGHT BEARING RESTRICTIONS No  FALLS:  Has patient fallen in last 6 months? No  LIVING ENVIRONMENT: Lives with: lives with their family Lives in: House/apartment  OCCUPATION: on disability  PLOF: Independent with basic ADLs and Leisure: yardwork, walking  Pt cares for her 29 young grandchildren- difficulty with care including lifting and carrying  PATIENT GOALS be more active with less pain, exercise at the gym regularly, lift and carry grandchildren and laundry, sleep with fewer interruptions, get back to more gardening.  OBJECTIVE:   DIAGNOSTIC FINDINGS: none recent   PATIENT SURVEYS: Fibromyalgia Impact Questionnaire: first 2 sections only:   COGNITION: Overall cognitive status: Within functional limits for tasks assessed  SENSATION: WFL  POSTURE: rounded shoulders and forward head  PALPATION: Diffuse palpable tenderness over bil neck, upper traps, thoracic, lumbar and gluteals with trigger points.  CERVICAL ROM:    Active ROM A/PROM (deg) eval  Flexion 55  Extension 40  Right lateral flexion 30  Left lateral flexion 35  Right rotation 50  Left rotation 40   (Blank rows = not tested)  UPPER EXTREMITY ROM: UE A/ROM is limited by 20% into flexion and abduction with pain at end range.  Hip flexibility is limited by 25% in all directions with pain in all directions.  UPPER EXTREMITY MMT: UE: 4+/5, LE 4+/5 except hip flexors 4-/5  TODAY'S TREATMENT:  Date: 12/05/21 Seated hamstring stretch and figure 4 stretch 3x20 seconds  Single knee to chest: 3x20 seconds and diagonal knee to chest: 3x20 seconds  Manual therapy: elongation to Lt gluteals for release    Date: 11/30/21: Pt arrives for aquatic physical therapy. Treatment took place in 3.5-5.5 feet of water. Water temperature was 91 degrees F. Pt entered the pool via stairs and light use of rails. Pt requires buoyancy of water for support and to offload joints with strengthening exercises.  Pt utilizes viscosity of the water required for strengthening.  Seated water bench with 75% submersion Pt performed seated LE AROM exercises 20x in all planes, concurrent discussion of pain/current status. Semi sit shoulder flex/ext gentle UE movement 3x10, fingers closed 75% depth water walking 10 xlength in each direction no AD. Single buoy UE weight shoulder horizontal add/abd 10x, push weights down by side hold 3 sec 10x, against the wall blue noodle lat press 10x Vc to contract TA and glutes. Hip lifts in 3 directions 10x each Bil. Pt requires holding on to the side of pool to maintain balance.  Underwater bicycle 50mn 2x with horseback decompression 30 sec inbetween sets.      Date: 11/22/21 Seated hamstring stretch and figure 4 stretch 3x20 seconds  Single knee to chest: 3x20 seconds and diagonal knee to chest: 3x20 seconds  Manual therapy: elongation to Lt gluteals for release   11/18/21:Pt arrives for aquatic physical therapy. Treatment took place in  3.5-5.5 feet of water. Water temperature was 91 degrees F. Pt entered the pool via stairs and light use of rails. Pt requires buoyancy of water for support and to offload joints with strengthening exercises.   Seated water bench with 75% submersion Pt performed seated LE AROM exercises 20x in all planes, concurrent discussion of pain/current status. Semi sit shoulder flex/ext gentle UE movement 2x10, fingers open. 75% depth water walking 4 length in each direction no AD. Single buoy UE weight shoulder horizontal add/abd 10x, push weights down by side hold 3 sec 10x, against the wall blue noodle lat press 10x Vc to contract TA and glutes. Underwater bicycle 1 min 3x with horseback decompression 30 sec I inbetween sets.   Date: 11/16/21 Trigger Point Dry-Needling  Treatment instructions: Expect mild to moderate muscle soreness. S/S of pneumothorax if dry needled over a lung field, and to seek immediate medical attention should they occur. Patient verbalized understanding of these instructions and education.  Patient Consent Given: Yes Education handout provided: Previously provided Muscles treated: Lt thoracic multifidi, Lt rhomboids Treatment response/outcome: Utilized skilled palpation to identify trigger points.  During dry needling able to palpate muscle twitch and muscle elongation   Skilled palpation and monitoring by PT during dry needling  Manual therapy: elongation to bil upper traps, rhomboids, cervical paraspinals PATIENT EDUCATION:  Education details: Access Code: [email or web address] (review from previous session). Discussed strategies to incorporate more movement into her day considering her depression.  Pt will work to implement these  strategies.   Person educated: Patient Education method: Explanation, Demonstration, and Handouts Education comprehension: verbalized understanding and returned demonstration   HOME EXERCISE PROGRAM: Access Code: [email or web address]  ASSESSMENT:  CLINICAL  IMPRESSION: Pt arrives to  PT with complaints of Lt shoulder blade pain and Lt UE pain.  Pt reports 8-9/10 pain over the past 2 days and now is 4/10 due to resting yesterday.  Session focused on DN and manual to this region.  Pt had twitch response in Lt rhomboids and demonstrated improved tissue mobility after manual therapy today.  Pt will continue to work on neck and thoracic mobility at home.  Patient will benefit from skilled PT to address the below impairments and improve overall function.    OBJECTIVE IMPAIRMENTS decreased activity tolerance, decreased endurance, difficulty walking, decreased strength, increased muscle spasms, impaired flexibility, improper body mechanics, postural dysfunction, and pain.   ACTIVITY LIMITATIONS carrying, lifting, sitting, standing, reach over head, hygiene/grooming, and locomotion level  PARTICIPATION LIMITATIONS: meal prep, cleaning, laundry, driving, community activity, and yard work  PERSONAL FACTORS Past/current experiences, Time since onset of injury/illness/exacerbation, and 3+ comorbidities: fibromyalgia depression, anxiety,   are also affecting patient's functional outcome.   REHAB POTENTIAL: Good  CLINICAL DECISION MAKING: Evolving/moderate complexity  EVALUATION COMPLEXITY: Moderate   GOALS: Goals reviewed with patient? Yes  SHORT TERM GOALS: Target date: 12/01/2021   Return to regular performance of HEP 2-3x/day to improve mobility  Baseline: not performing HEP Goal status: In progress  2.  Report > or = to 25% reduction in cervical and lumbar pain with ADLs and self-care  Baseline: constant and 3-4/10  Goal status: INITIAL  3.  Reduce fibromyalgia impact score to < or = to 39  Baseline: 49 Goal status: INITIAL    LONG TERM GOALS: Target date: 12/29/2021  Be independent in advanced HEP Baseline: not currently exercising  Goal status: INITIAL  2.  Reduce fibromyalgia impact questionnaire to < or = to 29 Baseline: 49 Goal  status: INITIAL  3.  Lift and carry laundry and her grandchildren with > or = to 50% reduction in pain Baseline: 7/10 Goal status: INITIAL  4.  Report > or = to 50% reduction in neck and lumbar pain with ADLs and self-care  Baseline: 3-4/10 constant pain  Goal status: INITIAL  5.  Return to yardwork verbalize appropriate rest breaks and mechanics  Baseline:  Goal status: INITIAL   PLAN: PT FREQUENCY: 2x/week  PT DURATION: 8 weeks  PLANNED INTERVENTIONS: Therapeutic exercises, Therapeutic activity, Neuromuscular re-education, Balance training, Gait training, Patient/Family education, Self Care, Joint mobilization, Aquatic Therapy, Dry Needling, Spinal manipulation, Spinal mobilization, Cryotherapy, Moist heat, Taping, Traction, Manual therapy, and Re-evaluation  PLAN FOR NEXT SESSION:  manual to address myofascial pain, aquatics and progression of activity.    Sigurd Sos, PT 12/05/21 12:24 PM   [facility] [address] [place], [place] [address] Phone # [phone] Fax [phone]

## 2021-12-08 ENCOUNTER — Ambulatory Visit
Admission: RE | Admit: 2021-12-08 | Discharge: 2021-12-08 | Disposition: A | Discharge status: Home / Self Care | Payer: Medicare Other | Source: Ambulatory Visit | Attending: Family Medicine | Admitting: Family Medicine

## 2021-12-08 ENCOUNTER — Other Ambulatory Visit: Payer: Self-pay | Admitting: Family Medicine

## 2021-12-08 DIAGNOSIS — Z23 Encounter for immunization: Secondary | ICD-10-CM | POA: Diagnosis not present

## 2021-12-08 DIAGNOSIS — R202 Paresthesia of skin: Secondary | ICD-10-CM | POA: Diagnosis not present

## 2021-12-08 DIAGNOSIS — E538 Deficiency of other specified B group vitamins: Secondary | ICD-10-CM | POA: Diagnosis not present

## 2021-12-08 DIAGNOSIS — M797 Fibromyalgia: Secondary | ICD-10-CM | POA: Diagnosis not present

## 2021-12-08 DIAGNOSIS — F4310 Post-traumatic stress disorder, unspecified: Secondary | ICD-10-CM | POA: Diagnosis not present

## 2021-12-08 DIAGNOSIS — K589 Irritable bowel syndrome without diarrhea: Secondary | ICD-10-CM | POA: Diagnosis not present

## 2021-12-08 DIAGNOSIS — F411 Generalized anxiety disorder: Secondary | ICD-10-CM | POA: Diagnosis not present

## 2021-12-08 DIAGNOSIS — M25569 Pain in unspecified knee: Secondary | ICD-10-CM | POA: Diagnosis not present

## 2021-12-08 DIAGNOSIS — M79602 Pain in left arm: Secondary | ICD-10-CM

## 2021-12-08 DIAGNOSIS — H269 Unspecified cataract: Secondary | ICD-10-CM | POA: Diagnosis not present

## 2021-12-12 ENCOUNTER — Telehealth: Payer: Self-pay

## 2021-12-12 ENCOUNTER — Ambulatory Visit: Payer: Medicare Other

## 2021-12-12 NOTE — Telephone Encounter (Signed)
Called pt due to no-show for appt today.  Unable to leave a message.  Voicemail is full.

## 2021-12-14 ENCOUNTER — Encounter: Payer: Self-pay | Admitting: Physical Therapy

## 2021-12-14 ENCOUNTER — Ambulatory Visit: Payer: Medicare Other | Admitting: Physical Therapy

## 2021-12-14 DIAGNOSIS — R252 Cramp and spasm: Secondary | ICD-10-CM

## 2021-12-14 DIAGNOSIS — M542 Cervicalgia: Secondary | ICD-10-CM | POA: Diagnosis not present

## 2021-12-14 DIAGNOSIS — R293 Abnormal posture: Secondary | ICD-10-CM | POA: Diagnosis not present

## 2021-12-14 DIAGNOSIS — M353 Polymyalgia rheumatica: Secondary | ICD-10-CM | POA: Diagnosis not present

## 2021-12-14 DIAGNOSIS — M6281 Muscle weakness (generalized): Secondary | ICD-10-CM

## 2021-12-14 NOTE — Therapy (Signed)
OUTPATIENT PHYSICAL THERAPY TREATMENT   Patient Name: Danielle Bruce MRN: [id number] DOB:[birth year], 61 y.o., female Today's Date: 12/14/2021   PT End of Session - 12/14/21 2109     Visit Number 8    Date for PT Re-Evaluation 12/29/21    Authorization Type Medicare B- needs KX modifier now    Progress Note Due on Visit 10    PT Start Time 1015    PT Stop Time 1100    PT Time Calculation (min) 45 min    Activity Tolerance No increased pain;Patient tolerated treatment well    Behavior During Therapy [facility] for tasks assessed/performed                   Past Medical History:  Diagnosis Date   Anemia    Anxiety    Arthritis    osteoarthritis   ASCUS (atypical squamous cells of undetermined significance) on Pap smear 05/06/2005   NEG HIGH RISK HPV--C&B BIOPSY BENIGN 12/2005   Asthma    Bipolar 2 disorder (HCC)    Cancer ([place])    skin cancer - basal cell   Colon polyps    Complication of anesthesia    anxious afterwards, will get headaches    Constipation    Depression    Fibromyalgia 10/2013   GERD (gastroesophageal reflux disease)    Hearing loss on left    Heart murmur    never had any problems   Hemorrhoids    High cholesterol    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   cytology negative   IBS (irritable bowel syndrome)    Insomnia    Lymphocytic colitis    Lymphoma (HCC)    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   Bone marrow biopsy showes 8% plasma cells IgA Lambda   Migraines    Osteoarthritis    Osteopenia    Peripheral neuropathy    PTSD (post-traumatic stress disorder)    Past Surgical History:  Procedure Laterality Date   BONE MARROW BIOPSY Left 12/18/2013   Plasma cell dyscrasia 8% population of plasma cells   BREAST BIOPSY Right    benign stereo   CESAREAN SECTION  59,56,38   CHOLECYSTECTOMY N/A 10/16/2012   Procedure: LAPAROSCOPIC CHOLECYSTECTOMY;  Surgeon: Joyice Faster. Cornett, MD;  Location: [facility];  Service: General;   Laterality: N/A;   COLONOSCOPY     numerous times   DILATION AND CURETTAGE OF UTERUS     ESOPHAGOGASTRODUODENOSCOPY     HEMORRHOID SURGERY  1993   x3   IUD REMOVAL  02/2015   Mirena   LAPAROSCOPIC LYSIS OF ADHESIONS N/A 10/16/2012   Procedure: LAPAROSCOPIC LYSIS OF ADHESIONS;  Surgeon: Marcello Moores A. Cornett, MD;  Location: [facility];  Service: General;  Laterality: N/A;   LAPAROSCOPY N/A 10/16/2012   Procedure: LAPAROSCOPY DIAGNOSTIC;  Surgeon: Joyice Faster. Cornett, MD;  Location: [facility];  Service: General;  Laterality: N/A;   PELVIC LAPAROSCOPY     RADIOLOGY WITH ANESTHESIA N/A 12/16/2015   Procedure: MRI OF BRAIN WITH AND WITHOUT CONTRAST;  Surgeon: Medication Radiologist, MD;  Location: [facility];  Service: Radiology;  Laterality: N/A;   SHOULDER SURGERY  2007/2008   SPINE SURGERY  2010   cervical   Patient Active Problem List   Diagnosis Date Noted   GAD (generalized anxiety disorder) 12/26/2017   OCD (obsessive compulsive disorder) 12/26/2017   PTSD (post-traumatic stress disorder) 12/26/2017   DDD (degenerative disc disease), cervical 07/14/2016   Primary osteoarthritis of both feet  07/14/2016   Primary osteoarthritis of both hands 07/14/2016   Other fatigue 07/14/2016   History of IBS 07/14/2016   Osteopenia of multiple sites 07/14/2016   Fibromyalgia 01/13/2016   MGUS (monoclonal gammopathy of unknown significance) 12/23/2013   Chronic cholecystitis without calculus 10/18/2012   Abdominal pain, unspecified site 10/18/2012   Nausea alone 10/18/2012   Unspecified constipation 10/18/2012   Depression 10/18/2012   Anxiety    ASCUS (atypical squamous cells of undetermined significance) on Pap smear    IUD    Hemorrhoids 11/29/2010   Abdominal pain, left upper quadrant 11/29/2010    PCP:  Mayra Neer, MD  REFERRING PROVIDER: Mayra Neer, MD  REFERRING DIAG: fibromyalgia  THERAPY DIAG:  Abnormal posture  Muscle weakness (generalized)  Cramp and spasm  Rationale for  Evaluation and Treatment Rehabilitation  ONSET DATE: chronic pain (fibromyalgia) with flare-up 2 months ago  SUBJECTIVE:                                                                                                                                                                                                         SUBJECTIVE STATEMENT: I slept through my Monday appt. I have been sleeping a lot lately. Got an xray on my neck. MD recommended I see MD to get shots in my neck.  PERTINENT HISTORY:  Anxiety, bipolar disorder, depression, fibromyalgia, IBS, migraines, osteopenia, PTSD  PAIN:  Are you having pain? Yes: NPRS scale: neck: 4/10 Pain location: neck and upper back, hands, legs (Lt>Rt) Pain description: sore, sharp, annoying Aggravating factors: activity, yard work Relieving factors: muscle relaxers, rest, stretching  PRECAUTIONS: Other: chronic pain syndrome and depression Other: slow progression with exercise due to chronic condition  WEIGHT BEARING RESTRICTIONS No  FALLS:  Has patient fallen in last 6 months? No  LIVING ENVIRONMENT: Lives with: lives with their family Lives in: House/apartment  OCCUPATION: on disability  PLOF: Independent with basic ADLs and Leisure: yardwork, walking  Pt cares for her 73 young grandchildren- difficulty with care including lifting and carrying  PATIENT GOALS be more active with less pain, exercise at the gym regularly, lift and carry grandchildren and laundry, sleep with fewer interruptions, get back to more gardening.  OBJECTIVE:   DIAGNOSTIC FINDINGS: none recent   PATIENT SURVEYS: Fibromyalgia Impact Questionnaire: first 2 sections only:   COGNITION: Overall cognitive status: Within functional limits for tasks assessed  SENSATION: WFL  POSTURE: rounded shoulders and forward head  PALPATION: Diffuse palpable tenderness over bil neck, upper traps, thoracic, lumbar and gluteals with trigger points.  CERVICAL ROM:    Active ROM A/PROM (deg) eval  Flexion 55  Extension 40  Right lateral flexion 30  Left lateral flexion 35  Right rotation 50  Left rotation 40   (Blank rows = not tested)  UPPER EXTREMITY ROM: UE A/ROM is limited by 20% into flexion and abduction with pain at end range.  Hip flexibility is limited by 25% in all directions with pain in all directions.  UPPER EXTREMITY MMT: UE: 4+/5, LE 4+/5 except hip flexors 4-/5  TODAY'S TREATMENT:   12/14/21:Pt arrives for aquatic physical therapy. Treatment took place in 3.5-5.5 feet of water. Water temperature was 91 degrees F. Pt entered the pool via stairs and light use of rails. Pt requires buoyancy of water for support and to offload joints with strengthening exercises.  Pt utilizes viscosity of the water required for strengthening.  Seated water bench with 75% submersion Pt performed seated LE AROM exercises 20x in all planes, concurrent discussion of pain/current status. Semi sit shoulder flex/ext gentle UE movement 3x10, fingers closed 75% depth water walking 10 xlength in each direction no AD. Single buoy UE weight shoulder horizontal add/abd 10x, push weights down by side hold 3 sec 10x then added march across pool 4x, against the wall blue noodle lat press 10x2 Vc to contract TA and glutes. Hip lifts in 3 directions 15x each Bil. Pt requires less holding on to the side of pool to maintain balance. Double buoy UE weights for shoulder horizontal add/abd 2x10.   Underwater bicycle 35mn 2x with horseback   Date: 12/05/21 Seated hamstring stretch and figure 4 stretch 3x20 seconds  Single knee to chest: 3x20 seconds and diagonal knee to chest: 3x20 seconds  Manual therapy: elongation to Lt gluteals for release    Date: 11/30/21: Pt arrives for aquatic physical therapy. Treatment took place in 3.5-5.5 feet of water. Water temperature was 91 degrees F. Pt entered the pool via stairs and light use of rails. Pt requires buoyancy of water for  support and to offload joints with strengthening exercises.  Pt utilizes viscosity of the water required for strengthening.  Seated water bench with 75% submersion Pt performed seated LE AROM exercises 20x in all planes, concurrent discussion of pain/current status. Semi sit shoulder flex/ext gentle UE movement 3x10, fingers closed 75% depth water walking 10 xlength in each direction no AD. Single buoy UE weight shoulder horizontal add/abd 10x, push weights down by side hold 3 sec 10x, against the wall blue noodle lat press 10x Vc to contract TA and glutes. Hip lifts in 3 directions 10x each Bil. Pt requires holding on to the side of pool to maintain balance.  Underwater bicycle 545m 2x with horseback decompression 30 sec inbetween sets.      Date: 11/22/21 Seated hamstring stretch and figure 4 stretch 3x20 seconds  Single knee to chest: 3x20 seconds and diagonal knee to chest: 3x20 seconds  Manual therapy: elongation to Lt gluteals for release   PATIENT EDUCATION:  Education details: Access Code: [email or web address] (review from previous session). Discussed strategies to incorporate more movement into her day considering her depression.  Pt will work to implement these strategies.   Person educated: Patient Education method: Explanation, Demonstration, and Handouts Education comprehension: verbalized understanding and returned demonstration   HOME EXERCISE PROGRAM: Access Code: [email or web address]  ASSESSMENT:  CLINICAL IMPRESSION: Pt had xray on her neck that showed some degeneration and possible nerve involvement below level of fusion. She will most likely have injections at some time. Pt  continues to tolerate aquatic exercises very well, not increasing her neck/scapular pain. Pt required less holding on with hip kicks today. LTLE more unstable than RTLE.    OBJECTIVE IMPAIRMENTS decreased activity tolerance, decreased endurance, difficulty walking, decreased strength, increased muscle spasms, impaired  flexibility, improper body mechanics, postural dysfunction, and pain.   ACTIVITY LIMITATIONS carrying, lifting, sitting, standing, reach over head, hygiene/grooming, and locomotion level  PARTICIPATION LIMITATIONS: meal prep, cleaning, laundry, driving, community activity, and yard work  PERSONAL FACTORS Past/current experiences, Time since onset of injury/illness/exacerbation, and 3+ comorbidities: fibromyalgia depression, anxiety,   are also affecting patient's functional outcome.   REHAB POTENTIAL: Good  CLINICAL DECISION MAKING: Evolving/moderate complexity  EVALUATION COMPLEXITY: Moderate   GOALS: Goals reviewed with patient? Yes  SHORT TERM GOALS: Target date: 12/01/2021   Return to regular performance of HEP 2-3x/day to improve mobility  Baseline: not performing HEP Goal status: In progress  2.  Report > or = to 25% reduction in cervical and lumbar pain with ADLs and self-care  Baseline: constant and 3-4/10  Goal status: INITIAL  3.  Reduce fibromyalgia impact score to < or = to 39  Baseline: 49 Goal status: INITIAL    LONG TERM GOALS: Target date: 12/29/2021  Be independent in advanced HEP Baseline: not currently exercising  Goal status: INITIAL  2.  Reduce fibromyalgia impact questionnaire to < or = to 29 Baseline: 49 Goal status: INITIAL  3.  Lift and carry laundry and her grandchildren with > or = to 50% reduction in pain Baseline: 7/10 Goal status: INITIAL  4.  Report > or = to 50% reduction in neck and lumbar pain with ADLs and self-care  Baseline: 3-4/10 constant pain  Goal status: INITIAL  5.  Return to yardwork verbalize appropriate rest breaks and mechanics  Baseline:  Goal status: INITIAL   PLAN: PT FREQUENCY: 2x/week  PT DURATION: 8 weeks  PLANNED INTERVENTIONS: Therapeutic exercises, Therapeutic activity, Neuromuscular re-education, Balance training, Gait training, Patient/Family education, Self Care, Joint mobilization, Aquatic Therapy,  Dry Needling, Spinal manipulation, Spinal mobilization, Cryotherapy, Moist heat, Taping, Traction, Manual therapy, and Re-evaluation  PLAN FOR NEXT SESSION:  manual to address myofascial pain, aquatics and progression of activity.    Myrene Galas, PTA 12/14/21 9:10 PM   [facility] [address], [address] [place], [place] [address] Phone # [phone] Fax [phone]

## 2021-12-19 ENCOUNTER — Ambulatory Visit: Payer: Medicare Other

## 2021-12-19 DIAGNOSIS — M353 Polymyalgia rheumatica: Secondary | ICD-10-CM | POA: Diagnosis not present

## 2021-12-19 DIAGNOSIS — M6281 Muscle weakness (generalized): Secondary | ICD-10-CM

## 2021-12-19 DIAGNOSIS — M542 Cervicalgia: Secondary | ICD-10-CM

## 2021-12-19 DIAGNOSIS — R293 Abnormal posture: Secondary | ICD-10-CM

## 2021-12-19 DIAGNOSIS — R252 Cramp and spasm: Secondary | ICD-10-CM

## 2021-12-19 NOTE — Therapy (Signed)
OUTPATIENT PHYSICAL THERAPY TREATMENT   Patient Name: Danielle Bruce MRN: [id number] DOB:18-Nov-1960, 61 y.o., female Today's Date: 12/19/2021   PT End of Session - 12/19/21 1227     Visit Number 9    Date for PT Re-Evaluation 12/29/21    Authorization Type Medicare B- needs KX modifier now    Progress Note Due on Visit 10    PT Start Time 1152    PT Stop Time 1225    PT Time Calculation (min) 33 min    Activity Tolerance No increased pain;Patient tolerated treatment well    Behavior During Therapy [facility] for tasks assessed/performed                    Past Medical History:  Diagnosis Date   Anemia    Anxiety    Arthritis    osteoarthritis   ASCUS (atypical squamous cells of undetermined significance) on Pap smear 05/06/2005   NEG HIGH RISK HPV--C&B BIOPSY BENIGN 12/2005   Asthma    Bipolar 2 disorder (HCC)    Cancer ([place])    skin cancer - basal cell   Colon polyps    Complication of anesthesia    anxious afterwards, will get headaches    Constipation    Depression    Fibromyalgia 10/2013   GERD (gastroesophageal reflux disease)    Hearing loss on left    Heart murmur    never had any problems   Hemorrhoids    High cholesterol    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   cytology negative   IBS (irritable bowel syndrome)    Insomnia    Lymphocytic colitis    Lymphoma (HCC)    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   Bone marrow biopsy showes 8% plasma cells IgA Lambda   Migraines    Osteoarthritis    Osteopenia    Peripheral neuropathy    PTSD (post-traumatic stress disorder)    Past Surgical History:  Procedure Laterality Date   BONE MARROW BIOPSY Left 12/18/2013   Plasma cell dyscrasia 8% population of plasma cells   BREAST BIOPSY Right    benign stereo   CESAREAN SECTION  91,91,66   CHOLECYSTECTOMY N/A 10/16/2012   Procedure: LAPAROSCOPIC CHOLECYSTECTOMY;  Surgeon: Joyice Faster. Cornett, MD;  Location: [facility];  Service: General;   Laterality: N/A;   COLONOSCOPY     numerous times   DILATION AND CURETTAGE OF UTERUS     ESOPHAGOGASTRODUODENOSCOPY     HEMORRHOID SURGERY  1993   x3   IUD REMOVAL  02/2015   Mirena   LAPAROSCOPIC LYSIS OF ADHESIONS N/A 10/16/2012   Procedure: LAPAROSCOPIC LYSIS OF ADHESIONS;  Surgeon: Marcello Moores A. Cornett, MD;  Location: [facility];  Service: General;  Laterality: N/A;   LAPAROSCOPY N/A 10/16/2012   Procedure: LAPAROSCOPY DIAGNOSTIC;  Surgeon: Joyice Faster. Cornett, MD;  Location: [facility];  Service: General;  Laterality: N/A;   PELVIC LAPAROSCOPY     RADIOLOGY WITH ANESTHESIA N/A 12/16/2015   Procedure: MRI OF BRAIN WITH AND WITHOUT CONTRAST;  Surgeon: Medication Radiologist, MD;  Location: [facility];  Service: Radiology;  Laterality: N/A;   SHOULDER SURGERY  2007/2008   SPINE SURGERY  2010   cervical   Patient Active Problem List   Diagnosis Date Noted   GAD (generalized anxiety disorder) 12/26/2017   OCD (obsessive compulsive disorder) 12/26/2017   PTSD (post-traumatic stress disorder) 12/26/2017   DDD (degenerative disc disease), cervical 07/14/2016   Primary osteoarthritis of both  feet 07/14/2016   Primary osteoarthritis of both hands 07/14/2016   Other fatigue 07/14/2016   History of IBS 07/14/2016   Osteopenia of multiple sites 07/14/2016   Fibromyalgia 01/13/2016   MGUS (monoclonal gammopathy of unknown significance) 12/23/2013   Chronic cholecystitis without calculus 10/18/2012   Abdominal pain, unspecified site 10/18/2012   Nausea alone 10/18/2012   Unspecified constipation 10/18/2012   Depression 10/18/2012   Anxiety    ASCUS (atypical squamous cells of undetermined significance) on Pap smear    IUD    Hemorrhoids 11/29/2010   Abdominal pain, left upper quadrant 11/29/2010    PCP:  Mayra Neer, MD  REFERRING PROVIDER: Mayra Neer, MD  REFERRING DIAG: fibromyalgia  THERAPY DIAG:  Abnormal posture  Muscle weakness (generalized)  Cramp and  spasm  Cervicalgia  Polymyalgia rheumatica ([place])  Rationale for Evaluation and Treatment Rehabilitation  ONSET DATE: chronic pain (fibromyalgia) with flare-up 2 months ago  SUBJECTIVE:                                                                                                                                                                                                         SUBJECTIVE STATEMENT: I am having tingling in my arms. I got an x-ray and it showed narrowing at C7.  I will see the neurosurgeon to talk about injections.    PERTINENT HISTORY:  Anxiety, bipolar disorder, depression, fibromyalgia, IBS, migraines, osteopenia, PTSD  PAIN:  Are you having pain? Yes: NPRS scale: neck: 2-3/10 at rest in hands and overall  Pain location: neck and upper back, hands, legs (Lt>Rt) Pain description: sore, sharp, annoying Aggravating factors: activity, yard work Relieving factors: muscle relaxers, rest, stretching  PRECAUTIONS: Other: chronic pain syndrome and depression Other: slow progression with exercise due to chronic condition  WEIGHT BEARING RESTRICTIONS No  FALLS:  Has patient fallen in last 6 months? No  LIVING ENVIRONMENT: Lives with: lives with their family Lives in: House/apartment  OCCUPATION: on disability  PLOF: Independent with basic ADLs and Leisure: yardwork, walking  Pt cares for her 68 young grandchildren- difficulty with care including lifting and carrying  PATIENT GOALS be more active with less pain, exercise at the gym regularly, lift and carry grandchildren and laundry, sleep with fewer interruptions, get back to more gardening.  OBJECTIVE:   DIAGNOSTIC FINDINGS: none recent   PATIENT SURVEYS: Fibromyalgia Impact Questionnaire: first 2 sections only:   COGNITION: Overall cognitive status: Within functional limits for tasks assessed  SENSATION: WFL  POSTURE: rounded shoulders and forward head  PALPATION: Diffuse palpable tenderness  over  bil neck, upper traps, thoracic, lumbar and gluteals with trigger points.    CERVICAL ROM:   Active ROM A/PROM (deg) eval  Flexion 55  Extension 40  Right lateral flexion 30  Left lateral flexion 35  Right rotation 50  Left rotation 40   (Blank rows = not tested)  UPPER EXTREMITY ROM: UE A/ROM is limited by 20% into flexion and abduction with pain at end range.  Hip flexibility is limited by 25% in all directions with pain in all directions.  UPPER EXTREMITY MMT: UE: 4+/5, LE 4+/5 except hip flexors 4-/5  TODAY'S TREATMENT:  Date: 12/19/21 Trigger Point Dry-Needling  Treatment instructions: Expect mild to moderate muscle soreness. S/S of pneumothorax if dry needled over a lung field, and to seek immediate medical attention should they occur. Patient verbalized understanding of these instructions and education.  Patient Consent Given: Yes Education handout provided: Previously provided Muscles treated: Lt rhomboids, bil thoracic multifidi T5-7 Treatment response/outcome: Utilized skilled palpation to identify trigger points.  During dry needling able to palpate muscle twitch and muscle elongation  Elongation and mobilization of tissue after DN Skilled palpation and monitoring by PT during dry needling   12/14/21:Pt arrives for aquatic physical therapy. Treatment took place in 3.5-5.5 feet of water. Water temperature was 91 degrees F. Pt entered the pool via stairs and light use of rails. Pt requires buoyancy of water for support and to offload joints with strengthening exercises.  Pt utilizes viscosity of the water required for strengthening.  Seated water bench with 75% submersion Pt performed seated LE AROM exercises 20x in all planes, concurrent discussion of pain/current status. Semi sit shoulder flex/ext gentle UE movement 3x10, fingers closed 75% depth water walking 10 xlength in each direction no AD. Single buoy UE weight shoulder horizontal add/abd 10x, push weights  down by side hold 3 sec 10x then added march across pool 4x, against the wall blue noodle lat press 10x2 Vc to contract TA and glutes. Hip lifts in 3 directions 15x each Bil. Pt requires less holding on to the side of pool to maintain balance. Double buoy UE weights for shoulder horizontal add/abd 2x10.   Underwater bicycle 9mn 2x with horseback   Date: 12/05/21 Seated hamstring stretch and figure 4 stretch 3x20 seconds  Single knee to chest: 3x20 seconds and diagonal knee to chest: 3x20 seconds  Manual therapy: elongation to Lt gluteals for release    Date: 11/30/21: Pt arrives for aquatic physical therapy. Treatment took place in 3.5-5.5 feet of water. Water temperature was 91 degrees F. Pt entered the pool via stairs and light use of rails. Pt requires buoyancy of water for support and to offload joints with strengthening exercises.  Pt utilizes viscosity of the water required for strengthening.  Seated water bench with 75% submersion Pt performed seated LE AROM exercises 20x in all planes, concurrent discussion of pain/current status. Semi sit shoulder flex/ext gentle UE movement 3x10, fingers closed 75% depth water walking 10 xlength in each direction no AD. Single buoy UE weight shoulder horizontal add/abd 10x, push weights down by side hold 3 sec 10x, against the wall blue noodle lat press 10x Vc to contract TA and glutes. Hip lifts in 3 directions 10x each Bil. Pt requires holding on to the side of pool to maintain balance.  Underwater bicycle 522m 2x with horseback decompression 30 sec inbetween sets.       PATIENT EDUCATION:  Education details: Access Code: [email or web address] from previous session). Discussed strategies to  incorporate more movement into her day considering her depression.  Pt will work to implement these strategies.   Person educated: Patient Education method: Explanation, Demonstration, and Handouts Education comprehension: verbalized understanding and returned  demonstration   HOME EXERCISE PROGRAM: Access Code: [email or web address]  ASSESSMENT:  CLINICAL IMPRESSION: Pt had xray on her neck that showed some degeneration and possible nerve involvement below level of fusion. She will most likely have injections at some time. Pt continues to tolerate aquatic exercises very well, not increasing her neck/scapular pain.  Pt arrived with Lt sided thoracic spine pain and tension and session focused on manual therapy to address.  Patient will benefit from skilled PT to address the below impairments and improve overall function.    OBJECTIVE IMPAIRMENTS decreased activity tolerance, decreased endurance, difficulty walking, decreased strength, increased muscle spasms, impaired flexibility, improper body mechanics, postural dysfunction, and pain.   ACTIVITY LIMITATIONS carrying, lifting, sitting, standing, reach over head, hygiene/grooming, and locomotion level  PARTICIPATION LIMITATIONS: meal prep, cleaning, laundry, driving, community activity, and yard work  PERSONAL FACTORS Past/current experiences, Time since onset of injury/illness/exacerbation, and 3+ comorbidities: fibromyalgia depression, anxiety,   are also affecting patient's functional outcome.   REHAB POTENTIAL: Good  CLINICAL DECISION MAKING: Evolving/moderate complexity  EVALUATION COMPLEXITY: Moderate   GOALS: Goals reviewed with patient? Yes  SHORT TERM GOALS: Target date: 12/01/2021   Return to regular performance of HEP 2-3x/day to improve mobility  Baseline: not performing HEP Goal status: In progress  2.  Report > or = to 25% reduction in cervical and lumbar pain with ADLs and self-care  Baseline: constant and 3-4/10  Goal status: INITIAL  3.  Reduce fibromyalgia impact score to < or = to 39  Baseline: 49 Goal status: INITIAL    LONG TERM GOALS: Target date: 12/29/2021  Be independent in advanced HEP Baseline: not currently exercising  Goal status: INITIAL  2.  Reduce  fibromyalgia impact questionnaire to < or = to 29 Baseline: 49 Goal status: INITIAL  3.  Lift and carry laundry and her grandchildren with > or = to 50% reduction in pain Baseline: 7/10 Goal status: INITIAL  4.  Report > or = to 50% reduction in neck and lumbar pain with ADLs and self-care  Baseline: 3-4/10 constant pain  Goal status: INITIAL  5.  Return to yardwork verbalize appropriate rest breaks and mechanics  Baseline:  Goal status: INITIAL   PLAN: PT FREQUENCY: 2x/week  PT DURATION: 8 weeks  PLANNED INTERVENTIONS: Therapeutic exercises, Therapeutic activity, Neuromuscular re-education, Balance training, Gait training, Patient/Family education, Self Care, Joint mobilization, Aquatic Therapy, Dry Needling, Spinal manipulation, Spinal mobilization, Cryotherapy, Moist heat, Taping, Traction, Manual therapy, and Re-evaluation  PLAN FOR NEXT SESSION:  manual to address myofascial pain, aquatics and progression of activity.    Sigurd Sos, PT 12/19/21 12:30 PM   [facility] [address], [place], [place] [address] Phone # [phone] Fax [phone]

## 2021-12-21 DIAGNOSIS — H33322 Round hole, left eye: Secondary | ICD-10-CM | POA: Diagnosis not present

## 2021-12-26 ENCOUNTER — Ambulatory Visit: Payer: Medicare Other

## 2021-12-26 DIAGNOSIS — M542 Cervicalgia: Secondary | ICD-10-CM

## 2021-12-26 DIAGNOSIS — M6281 Muscle weakness (generalized): Secondary | ICD-10-CM | POA: Diagnosis not present

## 2021-12-26 DIAGNOSIS — M353 Polymyalgia rheumatica: Secondary | ICD-10-CM

## 2021-12-26 DIAGNOSIS — R252 Cramp and spasm: Secondary | ICD-10-CM

## 2021-12-26 DIAGNOSIS — R293 Abnormal posture: Secondary | ICD-10-CM

## 2021-12-26 DIAGNOSIS — G5603 Carpal tunnel syndrome, bilateral upper limbs: Secondary | ICD-10-CM | POA: Diagnosis not present

## 2021-12-26 DIAGNOSIS — M5412 Radiculopathy, cervical region: Secondary | ICD-10-CM | POA: Diagnosis not present

## 2021-12-26 NOTE — Therapy (Signed)
OUTPATIENT PHYSICAL THERAPY TREATMENT   Patient Name: Danielle Bruce MRN: [id number] DOB:02-07-1961, 61 y.o., female Today's Date: 12/26/2021 Progress Note Reporting Period 11/03/21 to 12/26/21  See note below for Objective Data and Assessment of Progress/Goals.      PT End of Session - 12/26/21 1226     Visit Number 10    Date for PT Re-Evaluation 02/20/22    Authorization Type Medicare B- needs KX modifier now    Progress Note Due on Visit 20    PT Start Time 1148    PT Stop Time 1226    PT Time Calculation (min) 38 min    Activity Tolerance No increased pain;Patient tolerated treatment well    Behavior During Therapy [facility] for tasks assessed/performed                     Past Medical History:  Diagnosis Date   Anemia    Anxiety    Arthritis    osteoarthritis   ASCUS (atypical squamous cells of undetermined significance) on Pap smear 05/06/2005   NEG HIGH RISK HPV--C&B BIOPSY BENIGN 12/2005   Asthma    Bipolar 2 disorder (HCC)    Cancer ([place])    skin cancer - basal cell   Colon polyps    Complication of anesthesia    anxious afterwards, will get headaches    Constipation    Depression    Fibromyalgia 10/2013   GERD (gastroesophageal reflux disease)    Hearing loss on left    Heart murmur    never had any problems   Hemorrhoids    High cholesterol    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   cytology negative   IBS (irritable bowel syndrome)    Insomnia    Lymphocytic colitis    Lymphoma (HCC)    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   Bone marrow biopsy showes 8% plasma cells IgA Lambda   Migraines    Osteoarthritis    Osteopenia    Peripheral neuropathy    PTSD (post-traumatic stress disorder)    Past Surgical History:  Procedure Laterality Date   BONE MARROW BIOPSY Left 12/18/2013   Plasma cell dyscrasia 8% population of plasma cells   BREAST BIOPSY Right    benign stereo   CESAREAN SECTION  26,83,41   CHOLECYSTECTOMY  N/A 10/16/2012   Procedure: LAPAROSCOPIC CHOLECYSTECTOMY;  Surgeon: Joyice Faster. Cornett, MD;  Location: [facility];  Service: General;  Laterality: N/A;   COLONOSCOPY     numerous times   DILATION AND CURETTAGE OF UTERUS     ESOPHAGOGASTRODUODENOSCOPY     HEMORRHOID SURGERY  1993   x3   IUD REMOVAL  02/2015   Mirena   LAPAROSCOPIC LYSIS OF ADHESIONS N/A 10/16/2012   Procedure: LAPAROSCOPIC LYSIS OF ADHESIONS;  Surgeon: Marcello Moores A. Cornett, MD;  Location: [facility];  Service: General;  Laterality: N/A;   LAPAROSCOPY N/A 10/16/2012   Procedure: LAPAROSCOPY DIAGNOSTIC;  Surgeon: Joyice Faster. Cornett, MD;  Location: [facility];  Service: General;  Laterality: N/A;   PELVIC LAPAROSCOPY     RADIOLOGY WITH ANESTHESIA N/A 12/16/2015   Procedure: MRI OF BRAIN WITH AND WITHOUT CONTRAST;  Surgeon: Medication Radiologist, MD;  Location: [place];  Service: Radiology;  Laterality: N/A;   SHOULDER SURGERY  2007/2008   SPINE SURGERY  2010   cervical   Patient Active Problem List   Diagnosis Date Noted   GAD (generalized anxiety disorder) 12/26/2017   OCD (obsessive compulsive disorder)  12/26/2017   PTSD (post-traumatic stress disorder) 12/26/2017   DDD (degenerative disc disease), cervical 07/14/2016   Primary osteoarthritis of both feet 07/14/2016   Primary osteoarthritis of both hands 07/14/2016   Other fatigue 07/14/2016   History of IBS 07/14/2016   Osteopenia of multiple sites 07/14/2016   Fibromyalgia 01/13/2016   MGUS (monoclonal gammopathy of unknown significance) 12/23/2013   Chronic cholecystitis without calculus 10/18/2012   Abdominal pain, unspecified site 10/18/2012   Nausea alone 10/18/2012   Unspecified constipation 10/18/2012   Depression 10/18/2012   Anxiety    ASCUS (atypical squamous cells of undetermined significance) on Pap smear    IUD    Hemorrhoids 11/29/2010   Abdominal pain, left upper quadrant 11/29/2010    PCP:  Mayra Neer, MD  REFERRING PROVIDER: Mayra Neer,  MD  REFERRING DIAG: fibromyalgia  THERAPY DIAG:  Abnormal posture - Plan: PT plan of care cert/re-cert  Muscle weakness (generalized) - Plan: PT plan of care cert/re-cert  Cramp and spasm - Plan: PT plan of care cert/re-cert  Cervicalgia - Plan: PT plan of care cert/re-cert  Polymyalgia rheumatica ([place]) - Plan: PT plan of care cert/re-cert  Rationale for Evaluation and Treatment Rehabilitation  ONSET DATE: chronic pain (fibromyalgia) with flare-up 2 months ago  SUBJECTIVE:                                                                                                                                                                                                         SUBJECTIVE STATEMENT: I am not feeling great.  Stress because my dad is in the hospital.   The pool is helping my legs and needling is helping my cervical mobility.   PERTINENT HISTORY:  Anxiety, bipolar disorder, depression, fibromyalgia, IBS, migraines, osteopenia, PTSD  PAIN:  Are you having pain? Yes: NPRS scale: neck: 4-5/10 Pain location: neck and upper back, hands, legs (Lt>Rt) Pain description: sore, sharp, annoying Aggravating factors: activity, yard work Relieving factors: muscle relaxers, rest, stretching  PRECAUTIONS: Other: chronic pain syndrome and depression Other: slow progression with exercise due to chronic condition  WEIGHT BEARING RESTRICTIONS No  FALLS:  Has patient fallen in last 6 months? No  LIVING ENVIRONMENT: Lives with: lives with their family Lives in: House/apartment  OCCUPATION: on disability  PLOF: Independent with basic ADLs and Leisure: yardwork, walking  Pt cares for her 31 young grandchildren- difficulty with care including lifting and carrying  PATIENT GOALS be more active with less pain, exercise at the gym regularly, lift and carry grandchildren and laundry, sleep with fewer interruptions, get back  to more gardening.  OBJECTIVE:   DIAGNOSTIC FINDINGS: none  recent   PATIENT SURVEYS: Eval: Fibromyalgia Impact Questionnaire: first 2 sections only: 49 12/26/21: 40   COGNITION: Overall cognitive status: Within functional limits for tasks assessed  SENSATION: WFL  POSTURE: rounded shoulders and forward head  PALPATION: Diffuse palpable tenderness over bil neck, upper traps, thoracic, lumbar and gluteals with trigger points.    CERVICAL ROM:   Active ROM A/PROM (deg) eval A/ROM 12/26/21  Flexion 55   Extension 40   Right lateral flexion 30 35  Left lateral flexion 35 40  Right rotation 50 60  Left rotation 40 65   (Blank rows = not tested)  UPPER EXTREMITY ROM: UE A/ROM is limited by 20% into flexion and abduction with pain at end range.  Hip flexibility is limited by 25% in all directions with pain in all directions.  UPPER EXTREMITY MMT: UE: 4+/5, LE 4+/5 except hip flexors 4-/5  TODAY'S TREATMENT:  Date: 12/26/21 Trigger Point Dry-Needling  Treatment instructions: Expect mild to moderate muscle soreness. S/S of pneumothorax if dry needled over a lung field, and to seek immediate medical attention should they occur. Patient verbalized understanding of these instructions and education.  Patient Consent Given: Yes Education handout provided: Previously provided Muscles treated: Lt rhomboids, bil thoracic multifidi T5-7 Treatment response/outcome: Utilized skilled palpation to identify trigger points.  During dry needling able to palpate muscle twitch and muscle elongation  Elongation and mobilization of tissue after DN Skilled palpation and monitoring by PT during dry needling  Date: 12/19/21 Trigger Point Dry-Needling  Treatment instructions: Expect mild to moderate muscle soreness. S/S of pneumothorax if dry needled over a lung field, and to seek immediate medical attention should they occur. Patient verbalized understanding of these instructions and education.  Patient Consent Given: Yes Education handout provided:  Previously provided Muscles treated: Rt rhomboids, bil thoracic multifidi T5-7. Bil upper traps  Treatment response/outcome: Utilized skilled palpation to identify trigger points.  During dry needling able to palpate muscle twitch and muscle elongation  Elongation and mobilization of tissue after DN Skilled palpation and monitoring by PT during dry needling   12/14/21:Pt arrives for aquatic physical therapy. Treatment took place in 3.5-5.5 feet of water. Water temperature was 91 degrees F. Pt entered the pool via stairs and light use of rails. Pt requires buoyancy of water for support and to offload joints with strengthening exercises.  Pt utilizes viscosity of the water required for strengthening.  Seated water bench with 75% submersion Pt performed seated LE AROM exercises 20x in all planes, concurrent discussion of pain/current status. Semi sit shoulder flex/ext gentle UE movement 3x10, fingers closed 75% depth water walking 10 xlength in each direction no AD. Single buoy UE weight shoulder horizontal add/abd 10x, push weights down by side hold 3 sec 10x then added march across pool 4x, against the wall blue noodle lat press 10x2 Vc to contract TA and glutes. Hip lifts in 3 directions 15x each Bil. Pt requires less holding on to the side of pool to maintain balance. Double buoy UE weights for shoulder horizontal add/abd 2x10.   Underwater bicycle 63mn 2x with horseback   Date: 12/05/21 Seated hamstring stretch and figure 4 stretch 3x20 seconds  Single knee to chest: 3x20 seconds and diagonal knee to chest: 3x20 seconds  Manual therapy: elongation to Lt gluteals for release    Date: 11/30/21: Pt arrives for aquatic physical therapy. Treatment took place in 3.5-5.5 feet of water. Water temperature was  91 degrees F. Pt entered the pool via stairs and light use of rails. Pt requires buoyancy of water for support and to offload joints with strengthening exercises.  Pt utilizes viscosity of the water  required for strengthening.  Seated water bench with 75% submersion Pt performed seated LE AROM exercises 20x in all planes, concurrent discussion of pain/current status. Semi sit shoulder flex/ext gentle UE movement 3x10, fingers closed 75% depth water walking 10 xlength in each direction no AD. Single buoy UE weight shoulder horizontal add/abd 10x, push weights down by side hold 3 sec 10x, against the wall blue noodle lat press 10x Vc to contract TA and glutes. Hip lifts in 3 directions 10x each Bil. Pt requires holding on to the side of pool to maintain balance.  Underwater bicycle 15mn 2x with horseback decompression 30 sec inbetween sets.       PATIENT EDUCATION:  Education details: Access Code: [email or web address](review from previous session). Discussed strategies to incorporate more movement into her day considering her depression.  Pt will work to implement these strategies.   Person educated: Patient Education method: Explanation, Demonstration, and Handouts Education comprehension: verbalized understanding and returned demonstration   HOME EXERCISE PROGRAM: Access Code: [email or web address]  ASSESSMENT:  CLINICAL IMPRESSION: Pt arrived with Rt UE pain due to leaf blowing. Pt reports that she is stretching daily and not performing the strength exercises as often. Pt reports 10% overall reduction in neck and lumbar pain since the start of care.  She has improved cervical mobility and LEs feel better and stronger since exercising in the pool.  Cervical A/ROM is improved overall.  Pt with tension and trigger points in bil neck and had good response to DN with twitch response and improved tissue mobility.  Patient will benefit from skilled PT to address the below impairments and improve overall function.    OBJECTIVE IMPAIRMENTS decreased activity tolerance, decreased endurance, difficulty walking, decreased strength, increased muscle spasms, impaired flexibility, improper body mechanics, postural dysfunction,  and pain.   ACTIVITY LIMITATIONS carrying, lifting, sitting, standing, reach over head, hygiene/grooming, and locomotion level  PARTICIPATION LIMITATIONS: meal prep, cleaning, laundry, driving, community activity, and yard work  PERSONAL FACTORS Past/current experiences, Time since onset of injury/illness/exacerbation, and 3+ comorbidities: fibromyalgia depression, anxiety,   are also affecting patient's functional outcome.   REHAB POTENTIAL: Good  CLINICAL DECISION MAKING: Evolving/moderate complexity  EVALUATION COMPLEXITY: Moderate   GOALS: Goals reviewed with patient? Yes  SHORT TERM GOALS: Target date: 01/23/22  Return to regular performance of HEP 2-3x/day to improve mobility  Baseline: stretching 1-2x/day (12/26/21) Goal status: In progress  2.  Report > or = to 25% reduction in cervical and lumbar pain with ADLs and self-care  Baseline: 10% better overall (12/26/21) Goal status:   3.  Reduce fibromyalgia impact score to < or = to 39  Baseline: 40 (12/26/21) Goal status: In progress     LONG TERM GOALS: Target date: 02/20/22  Be independent in advanced HEP Baseline: gentle exercise  Goal status: In progress   2.  Reduce fibromyalgia impact questionnaire to < or = to 29 Baseline: 40 (12/26/21) Goal status: In progress   3.  Lift and carry laundry and her grandchildren with > or = to 50% reduction in pain Baseline: 10% better (12/26/21) Goal status: In progress   4.  Report > or = to 50% reduction in neck and lumbar pain with ADLs and self-care  Baseline: 10% better (12/26/21) Goal status: In progress   5.  Return to yardwork verbalize appropriate rest breaks and mechanics  Baseline: able to do some yard work (12/26/21) Goal status: In progress    PLAN: PT FREQUENCY: 2x/week  PT DURATION: 8 weeks  PLANNED INTERVENTIONS: Therapeutic exercises, Therapeutic activity, Neuromuscular re-education, Balance training, Gait training, Patient/Family education,  Self Care, Joint mobilization, Aquatic Therapy, Dry Needling, Spinal manipulation, Spinal mobilization, Cryotherapy, Moist heat, Taping, Traction, Manual therapy, and Re-evaluation  PLAN FOR NEXT SESSION:  manual to address myofascial pain, aquatics and progression of activity.    Sigurd Sos, PT 12/26/21 12:36 PM   [facility] [address], [place], [place] [address] Phone # [phone] Fax [phone]

## 2021-12-28 ENCOUNTER — Ambulatory Visit: Payer: Medicare Other | Admitting: Physical Therapy

## 2021-12-29 ENCOUNTER — Ambulatory Visit: Payer: Self-pay | Admitting: Psychology

## 2021-12-29 ENCOUNTER — Ambulatory Visit: Payer: Medicare Other

## 2022-01-02 ENCOUNTER — Telehealth: Payer: Self-pay | Admitting: Psychiatry

## 2022-01-02 ENCOUNTER — Other Ambulatory Visit: Payer: Self-pay

## 2022-01-02 DIAGNOSIS — F902 Attention-deficit hyperactivity disorder, combined type: Secondary | ICD-10-CM

## 2022-01-02 DIAGNOSIS — F5081 Binge eating disorder: Secondary | ICD-10-CM

## 2022-01-02 MED ORDER — LISDEXAMFETAMINE DIMESYLATE 40 MG PO CAPS: 40.0000 mg | ORAL_CAPSULE | Freq: Every day | ORAL | 0 refills | Status: DC

## 2022-01-02 NOTE — Telephone Encounter (Signed)
Pended.

## 2022-01-02 NOTE — Telephone Encounter (Signed)
Patient called in for refill on Vyvanse '40mg'$ . Ph: [phone] Appt 12/21 Pharmacy [facility] [address] [place]

## 2022-01-03 ENCOUNTER — Ambulatory Visit (INDEPENDENT_AMBULATORY_CARE_PROVIDER_SITE_OTHER): Payer: Medicare Other | Admitting: Psychology

## 2022-01-03 DIAGNOSIS — F4310 Post-traumatic stress disorder, unspecified: Secondary | ICD-10-CM | POA: Diagnosis not present
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
## 2022-01-04 ENCOUNTER — Ambulatory Visit: Payer: Self-pay | Admitting: Psychology

## 2022-01-04 ENCOUNTER — Ambulatory Visit: Payer: Medicare Other

## 2022-01-09 ENCOUNTER — Ambulatory Visit: Payer: Medicare Other | Attending: Family Medicine

## 2022-01-09 DIAGNOSIS — R293 Abnormal posture: Secondary | ICD-10-CM | POA: Insufficient documentation

## 2022-01-09 DIAGNOSIS — M353 Polymyalgia rheumatica: Secondary | ICD-10-CM | POA: Insufficient documentation

## 2022-01-09 DIAGNOSIS — M6281 Muscle weakness (generalized): Secondary | ICD-10-CM | POA: Diagnosis not present

## 2022-01-09 DIAGNOSIS — R252 Cramp and spasm: Secondary | ICD-10-CM | POA: Diagnosis not present

## 2022-01-09 DIAGNOSIS — M542 Cervicalgia: Secondary | ICD-10-CM | POA: Diagnosis not present

## 2022-01-09 NOTE — Therapy (Signed)
OUTPATIENT PHYSICAL THERAPY TREATMENT   Patient Name: Danielle Bruce MRN: [id number] DOB:1960-07-08, 61 y.o., female Today's Date: 01/09/2022      PT End of Session - 01/09/22 1058     Visit Number 11    Date for PT Re-Evaluation 02/20/22    Authorization Type Medicare B- needs KX modifier now    Progress Note Due on Visit 20    PT Start Time 1017    PT Stop Time 1051    PT Time Calculation (min) 34 min    Activity Tolerance Patient tolerated treatment well    Behavior During Therapy [facility] for tasks assessed/performed                      Past Medical History:  Diagnosis Date   Anemia    Anxiety    Arthritis    osteoarthritis   ASCUS (atypical squamous cells of undetermined significance) on Pap smear 05/06/2005   NEG HIGH RISK HPV--C&B BIOPSY BENIGN 12/2005   Asthma    Bipolar 2 disorder ([place])    Cancer ([place])    skin cancer - basal cell   Colon polyps    Complication of anesthesia    anxious afterwards, will get headaches    Constipation    Depression    Fibromyalgia 10/2013   GERD (gastroesophageal reflux disease)    Hearing loss on left    Heart murmur    never had any problems   Hemorrhoids    High cholesterol    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   cytology negative   IBS (irritable bowel syndrome)    Insomnia    Lymphocytic colitis    Lymphoma ([facility])    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   Bone marrow biopsy showes 8% plasma cells IgA Lambda   Migraines    Osteoarthritis    Osteopenia    Peripheral neuropathy    PTSD (post-traumatic stress disorder)    Past Surgical History:  Procedure Laterality Date   BONE MARROW BIOPSY Left 12/18/2013   Plasma cell dyscrasia 8% population of plasma cells   BREAST BIOPSY Right    benign stereo   CESAREAN SECTION  37,90,24   CHOLECYSTECTOMY N/A 10/16/2012   Procedure: LAPAROSCOPIC CHOLECYSTECTOMY;  Surgeon: Joyice Faster. Cornett, MD;  Location: [facility];  Service: General;   Laterality: N/A;   COLONOSCOPY     numerous times   DILATION AND CURETTAGE OF UTERUS     ESOPHAGOGASTRODUODENOSCOPY     HEMORRHOID SURGERY  1993   x3   IUD REMOVAL  02/2015   Mirena   LAPAROSCOPIC LYSIS OF ADHESIONS N/A 10/16/2012   Procedure: LAPAROSCOPIC LYSIS OF ADHESIONS;  Surgeon: Marcello Moores A. Cornett, MD;  Location: [facility];  Service: General;  Laterality: N/A;   LAPAROSCOPY N/A 10/16/2012   Procedure: LAPAROSCOPY DIAGNOSTIC;  Surgeon: Joyice Faster. Cornett, MD;  Location: [facility];  Service: General;  Laterality: N/A;   PELVIC LAPAROSCOPY     RADIOLOGY WITH ANESTHESIA N/A 12/16/2015   Procedure: MRI OF BRAIN WITH AND WITHOUT CONTRAST;  Surgeon: Medication Radiologist, MD;  Location: [place];  Service: Radiology;  Laterality: N/A;   SHOULDER SURGERY  2007/2008   SPINE SURGERY  2010   cervical   Patient Active Problem List   Diagnosis Date Noted   GAD (generalized anxiety disorder) 12/26/2017   OCD (obsessive compulsive disorder) 12/26/2017   PTSD (post-traumatic stress disorder) 12/26/2017   DDD (degenerative disc disease), cervical 07/14/2016   Primary  osteoarthritis of both feet 07/14/2016   Primary osteoarthritis of both hands 07/14/2016   Other fatigue 07/14/2016   History of IBS 07/14/2016   Osteopenia of multiple sites 07/14/2016   Fibromyalgia 01/13/2016   MGUS (monoclonal gammopathy of unknown significance) 12/23/2013   Chronic cholecystitis without calculus 10/18/2012   Abdominal pain, unspecified site 10/18/2012   Nausea alone 10/18/2012   Unspecified constipation 10/18/2012   Depression 10/18/2012   Anxiety    ASCUS (atypical squamous cells of undetermined significance) on Pap smear    IUD    Hemorrhoids 11/29/2010   Abdominal pain, left upper quadrant 11/29/2010    PCP:  Mayra Neer, MD  REFERRING PROVIDER: Mayra Neer, MD  REFERRING DIAG: fibromyalgia  THERAPY DIAG:  Abnormal posture  Muscle weakness (generalized)  Cramp and  spasm  Cervicalgia  Polymyalgia rheumatica ([place])  Rationale for Evaluation and Treatment Rehabilitation  ONSET DATE: chronic pain (fibromyalgia) with flare-up 2 months ago  SUBJECTIVE:                                                                                                                                                                                                         SUBJECTIVE STATEMENT: I felt better in my neck after needling last session.  I am wearing my carpal tunnel brace and still having numbness into some of my fingers.    PERTINENT HISTORY:  Anxiety, bipolar disorder, depression, fibromyalgia, IBS, migraines, osteopenia, PTSD  PAIN:  Are you having pain? Yes: NPRS scale: neck: 3-6/10 Pain location: Rt forearm and hand Pain description: sore, sharp, annoying Aggravating factors: activity, yard work, picking up heavy objects Relieving factors: muscle relaxers, rest, stretching  PRECAUTIONS: Other: chronic pain syndrome and depression Other: slow progression with exercise due to chronic condition  WEIGHT BEARING RESTRICTIONS No  FALLS:  Has patient fallen in last 6 months? No  LIVING ENVIRONMENT: Lives with: lives with their family Lives in: House/apartment  OCCUPATION: on disability  PLOF: Independent with basic ADLs and Leisure: yardwork, walking  Pt cares for her 10 young grandchildren- difficulty with care including lifting and carrying  PATIENT GOALS be more active with less pain, exercise at the gym regularly, lift and carry grandchildren and laundry, sleep with fewer interruptions, get back to more gardening.  OBJECTIVE:   DIAGNOSTIC FINDINGS: none recent   PATIENT SURVEYS: Eval: Fibromyalgia Impact Questionnaire: first 2 sections only: 49 12/26/21: 40   COGNITION: Overall cognitive status: Within functional limits for tasks assessed  SENSATION: WFL  POSTURE: rounded shoulders and forward head  PALPATION: Diffuse palpable  tenderness  over bil neck, upper traps, thoracic, lumbar and gluteals with trigger points.    CERVICAL ROM:   Active ROM A/PROM (deg) eval A/ROM 12/26/21  Flexion 55   Extension 40   Right lateral flexion 30 35  Left lateral flexion 35 40  Right rotation 50 60  Left rotation 40 65   (Blank rows = not tested)  UPPER EXTREMITY ROM: UE A/ROM is limited by 20% into flexion and abduction with pain at end range.  Hip flexibility is limited by 25% in all directions with pain in all directions.  UPPER EXTREMITY MMT: UE: 4+/5, LE 4+/5 except hip flexors 4-/5  TODAY'S TREATMENT:  Date: 01/09/22 Manual: elongation, mobs and passive stretch to Rt wrist flexors and joint  Date: 12/26/21 Trigger Point Dry-Needling  Treatment instructions: Expect mild to moderate muscle soreness. S/S of pneumothorax if dry needled over a lung field, and to seek immediate medical attention should they occur. Patient verbalized understanding of these instructions and education.  Patient Consent Given: Yes Education handout provided: Previously provided Muscles treated: Lt rhomboids, bil thoracic multifidi T5-7 Treatment response/outcome: Utilized skilled palpation to identify trigger points.  During dry needling able to palpate muscle twitch and muscle elongation  Elongation and mobilization of tissue after DN Skilled palpation and monitoring by PT during dry needling  Date: 12/19/21 Trigger Point Dry-Needling  Treatment instructions: Expect mild to moderate muscle soreness. S/S of pneumothorax if dry needled over a lung field, and to seek immediate medical attention should they occur. Patient verbalized understanding of these instructions and education.  Patient Consent Given: Yes Education handout provided: Previously provided Muscles treated: Rt rhomboids, bil thoracic multifidi T5-7. Bil upper traps  Treatment response/outcome: Utilized skilled palpation to identify trigger points.  During dry needling  able to palpate muscle twitch and muscle elongation  Elongation and mobilization of tissue after DN Skilled palpation and monitoring by PT during dry needling   12/14/21:Pt arrives for aquatic physical therapy. Treatment took place in 3.5-5.5 feet of water. Water temperature was 91 degrees F. Pt entered the pool via stairs and light use of rails. Pt requires buoyancy of water for support and to offload joints with strengthening exercises.  Pt utilizes viscosity of the water required for strengthening.  Seated water bench with 75% submersion Pt performed seated LE AROM exercises 20x in all planes, concurrent discussion of pain/current status. Semi sit shoulder flex/ext gentle UE movement 3x10, fingers closed 75% depth water walking 10 xlength in each direction no AD. Single buoy UE weight shoulder horizontal add/abd 10x, push weights down by side hold 3 sec 10x then added march across pool 4x, against the wall blue noodle lat press 10x2 Vc to contract TA and glutes. Hip lifts in 3 directions 15x each Bil. Pt requires less holding on to the side of pool to maintain balance. Double buoy UE weights for shoulder horizontal add/abd 2x10.   Underwater bicycle 53mn 2x with horseback         PATIENT EDUCATION:  Education details: Access Code: [email or web address](review from previous session). Discussed strategies to incorporate more movement into her day considering her depression.  Pt will work to implement these strategies.   Person educated: Patient Education method: Explanation, Demonstration, and Handouts Education comprehension: verbalized understanding and returned demonstration   HOME EXERCISE PROGRAM: Access Code: [email or web address]  ASSESSMENT:  CLINICAL IMPRESSION: Pt reports improvement in neck and upper back symptoms since last session.  Pt reports continued Rt carpal tunnel symptoms that she rates 3-6/10.  PT  advised that pt utilize wrist protection strategies with wearing brace and keeping neutral wrist  with yoga positions.  Session focused on elongation to musculature, passive stretch and wrist mobs. Patient will benefit from skilled PT to address the below impairments and improve overall function.    OBJECTIVE IMPAIRMENTS decreased activity tolerance, decreased endurance, difficulty walking, decreased strength, increased muscle spasms, impaired flexibility, improper body mechanics, postural dysfunction, and pain.   ACTIVITY LIMITATIONS carrying, lifting, sitting, standing, reach over head, hygiene/grooming, and locomotion level  PARTICIPATION LIMITATIONS: meal prep, cleaning, laundry, driving, community activity, and yard work  PERSONAL FACTORS Past/current experiences, Time since onset of injury/illness/exacerbation, and 3+ comorbidities: fibromyalgia depression, anxiety,   are also affecting patient's functional outcome.   REHAB POTENTIAL: Good  CLINICAL DECISION MAKING: Evolving/moderate complexity  EVALUATION COMPLEXITY: Moderate   GOALS: Goals reviewed with patient? Yes  SHORT TERM GOALS: Target date: 01/23/22  Return to regular performance of HEP 2-3x/day to improve mobility  Baseline: stretching 1-2x/day (12/26/21) Goal status: In progress  2.  Report > or = to 25% reduction in cervical and lumbar pain with ADLs and self-care  Baseline: 10% better overall (12/26/21) Goal status:   3.  Reduce fibromyalgia impact score to < or = to 39  Baseline: 40 (12/26/21) Goal status: In progress     LONG TERM GOALS: Target date: 02/20/22  Be independent in advanced HEP Baseline: gentle exercise  Goal status: In progress   2.  Reduce fibromyalgia impact questionnaire to < or = to 29 Baseline: 40 (12/26/21) Goal status: In progress   3.  Lift and carry laundry and her grandchildren with > or = to 50% reduction in pain Baseline: 10% better (12/26/21) Goal status: In progress   4.  Report > or = to 50% reduction in neck and lumbar pain with ADLs and self-care  Baseline:  10% better (12/26/21) Goal status: In progress   5.  Return to yardwork verbalize appropriate rest breaks and mechanics  Baseline: able to do some yard work (12/26/21) Goal status: In progress    PLAN: PT FREQUENCY: 2x/week  PT DURATION: 8 weeks  PLANNED INTERVENTIONS: Therapeutic exercises, Therapeutic activity, Neuromuscular re-education, Balance training, Gait training, Patient/Family education, Self Care, Joint mobilization, Aquatic Therapy, Dry Needling, Spinal manipulation, Spinal mobilization, Cryotherapy, Moist heat, Taping, Traction, Manual therapy, and Re-evaluation  PLAN FOR NEXT SESSION:  manual to address myofascial pain, aquatics and progression of activity.    Sigurd Sos, PT 01/09/22 10:59 AM   [facility] [address] [place] [place], [place] [address] Phone # [phone] Fax [phone]

## 2022-01-11 ENCOUNTER — Ambulatory Visit (INDEPENDENT_AMBULATORY_CARE_PROVIDER_SITE_OTHER): Payer: Medicare Other | Admitting: Psychology

## 2022-01-11 DIAGNOSIS — F4310 Post-traumatic stress disorder, unspecified: Secondary | ICD-10-CM | POA: Diagnosis not present

## 2022-01-11 NOTE — Progress Notes (Signed)
[facility] Counselor/Therapist Progress Note  Patient ID: Danielle Bruce, MRN: [id number],    Date: 01/11/2022  Time Spent: 60 minutes  Treatment Type: Individual Therapy  Reported Symptoms: flashbacks, responding to triggers, depression, anxiety, being busy all of the time, dissociation  Mental Status Exam: Appearance:  Casual     Behavior: Appropriate  Motor: Restlestness  Speech/Language:  Clear and Coherent  Affect: Blunt  Mood: anxious  Thought process: normal  Thought content:   WNL  Sensory/Perceptual disturbances:   WNL  Orientation: oriented to person, place, time/date, and situation  Attention: Good  Concentration: Good  Memory: WNL  Fund of knowledge:  Good  Insight:   Good  Judgment:  Fair  Impulse Control: Good   Risk Assessment: Danger to Self:  No Self-injurious Behavior: No Danger to Others: No Duty to Warn:no Physical Aggression / Violence:No  Access to Firearms a concern: No  Gang Involvement:No   Subjective: The patient attended a face-to-face individual therapy session in the office today.  The patient presents with a blunted affect and mood is anxious.  Today we talked a little more about what she is wanting to get out of therapy and I explained a little more about how triggers are formed in her brain related to the trauma that she has experienced.  We talked about doing EMDR and that her therapy is likely to take a while because of some of the developmental trauma that has been instilled.  I asked the patient to do some mindfulness and meditation and explained that we would be working on this during our next session.  Interventions: Cognitive Behavioral Therapy, Mindfulness Meditation, Eye Movement Desensitization and Reprocessing (EMDR), Insight-Oriented, and Interpersonal  Diagnosis:PTSD (post-traumatic stress disorder)  Plan: Plan of Care: Client Abilities/Strengths  Insightful, motivated, Supportive family Client Treatment  Preferences  Outpatient Individual therapy/EMDR  Client Statement of Needs  "I think I have PTSD and I feel like I need another kind of therapy than talk therapy" Treatment Level  Outpatient Individual therapy  Symptoms  Demonstrates an exaggerated startle response.:  (Status: maintained). Depressed  or irritable mood.: (Status:maintained). Describes a reliving of the event,  particularly through dissociative flashbacks.: (Status: maintained). Displays a  significant decline in interest and engagement in activities.:  (Status: maintained). Displays significant psychological and/or physiological distress resulting from internal and external  clues that are reminiscent of the traumatic event.: (Status: maintained).  Experiences disturbances in sleep.:  (Status: maintained). Experiences disturbing  and persistent thoughts, images, and/or perceptions of the traumatic event.:  (Status: maintained). Experiences frequent nightmares.: (Status: maintained).  Feelings of hopelessness, worthlessness, or inappropriate guilt.: No Description Entered (Status:  improved). Has been exposed to a traumatic event involving actual or perceived threat of death or  serious injury.:  (Status: maintained). Impairment in social, occupational, or  other areas of functioning.:(Status: maintained). Intentionally avoids activities,  places, people, or objects (e.g., up-armored vehicles) that evoke memories of the event.: (Status: maintained). Intentionally avoids thoughts, feelings, or discussions related  to the traumatic event.: (Status: maintained). Reports difficulty concentrating as  well as feelings of guilt (Status:maintained). Reports response of intense fear,  helplessness, or horror to the traumatic event.:  (Status: maintained).  Problems Addressed  Unipolar Depression, Posttraumatic Stress Disorder (PTSD), Posttraumatic Stress Disorder (PTSD),   Posttraumatic Stress Disorder (PTSD), Posttraumatic Stress  Disorder (PTSD)  Goals 1. Develop healthy thinking patterns and beliefs about self, others, and the world that lead to the alleviation and help prevent the relapse of  depression. Objective Identify and replace thoughts and beliefs that support depression. Target Date: 2024/11/07Frequency: Weekly Progress: 0 Modality: individual Related Interventions 1. Explore and restructure underlying assumptions and beliefs reflected in biased self-talk that  may put the client at risk for relapse or recurrence. 2. Conduct Cognitive-Behavioral Therapy (see Cognitive Behavior Therapy by Olevia Bowens; Overcoming Depression by Lynita Lombard al.), beginning with helping the client learn the connection among  cognition, depressive feelings, and actions. 2. Eliminate or reduce the negative impact trauma related symptoms have  on social, occupational, and family functioning. Objective Learn and implement personal skills to manage challenging situations related to trauma. Target Date: 2022/01/03 Frequency: Weekly Progress: 0 Modality: individual 3. No longer avoids persons, places, activities, and objects that are  reminiscent of the traumatic event. Objective Participate in Eye Movement Desensitization and Reprocessing (EMDR) to reduce emotional distress  related to traumatic thoughts, feelings, and images. Target Date: 2023/01/04 Frequency: Weekly Progress: 0 Modality: individual   Related Interventions 1. Utilize Eye Movement Desensitization and Reprocessing (EMDR) to reduce the client's  emotional reactivity to the traumatic event and reduce PTSD symptoms. Objective Learn and implement guided self-dialogue to manage thoughts, feelings, and urges brought on by  encounters with trauma-related situations. Target Date: 2024-11/07 Frequency: Weekly Progress: 0 Modality: individual Related Interventions 1. Teach the client a guided self-dialogue procedure in which he/she learns to recognize  maladaptive  self-talk, challenges its biases, copes with engendered feelings, overcomes  avoidance, and reinforces his/her accomplishments; review and reinforce progress, problemsolve obstacles. 4. No longer experiences intrusive event recollections, avoidance of event  reminders, intense arousal, or disinterest in activities or  relationships. 5. Thinks about or openly discusses the traumatic event with others  without experiencing psychological or physiological distress. Diagnosis Axis  none F43.10 (Posttraumatic stress disorder) - Open - [Signifier: n/a] Posttraumatic Stress  Disorder  Conditions For Discharge Achievement of treatment goals and objectives   Natoshia Souter G Izac Faulkenberry, LCSW

## 2022-01-12 ENCOUNTER — Encounter: Payer: Self-pay | Admitting: Neurological Surgery

## 2022-01-12 DIAGNOSIS — D485 Neoplasm of uncertain behavior of skin: Secondary | ICD-10-CM | POA: Diagnosis not present

## 2022-01-12 DIAGNOSIS — Z85828 Personal history of other malignant neoplasm of skin: Secondary | ICD-10-CM | POA: Diagnosis not present

## 2022-01-12 DIAGNOSIS — M542 Cervicalgia: Secondary | ICD-10-CM | POA: Diagnosis not present

## 2022-01-12 DIAGNOSIS — D479 Neoplasm of uncertain behavior of lymphoid, hematopoietic and related tissue, unspecified: Secondary | ICD-10-CM | POA: Diagnosis not present

## 2022-01-13 ENCOUNTER — Other Ambulatory Visit: Payer: Self-pay | Admitting: Neurological Surgery

## 2022-01-13 DIAGNOSIS — M542 Cervicalgia: Secondary | ICD-10-CM

## 2022-01-16 ENCOUNTER — Ambulatory Visit: Payer: Medicare Other

## 2022-01-16 DIAGNOSIS — R293 Abnormal posture: Secondary | ICD-10-CM | POA: Diagnosis not present

## 2022-01-16 DIAGNOSIS — R252 Cramp and spasm: Secondary | ICD-10-CM | POA: Diagnosis not present

## 2022-01-16 DIAGNOSIS — M542 Cervicalgia: Secondary | ICD-10-CM | POA: Diagnosis not present

## 2022-01-16 DIAGNOSIS — M6281 Muscle weakness (generalized): Secondary | ICD-10-CM

## 2022-01-16 DIAGNOSIS — M353 Polymyalgia rheumatica: Secondary | ICD-10-CM

## 2022-01-16 NOTE — Therapy (Signed)
OUTPATIENT PHYSICAL THERAPY TREATMENT   Patient Name: Danielle Bruce MRN: [id number] DOB:01-14-1961, 61 y.o., female Today's Date: 01/16/2022      PT End of Session - 01/16/22 1137     Visit Number 12    Date for PT Re-Evaluation 02/20/22    Authorization Type Medicare B- needs KX modifier now    Progress Note Due on Visit 20    PT Start Time 1102    PT Stop Time 1137    PT Time Calculation (min) 35 min    Activity Tolerance Patient tolerated treatment well    Behavior During Therapy [facility] for tasks assessed/performed                       Past Medical History:  Diagnosis Date   Anemia    Anxiety    Arthritis    osteoarthritis   ASCUS (atypical squamous cells of undetermined significance) on Pap smear 05/06/2005   NEG HIGH RISK HPV--C&B BIOPSY BENIGN 12/2005   Asthma    Bipolar 2 disorder ([place])    Cancer ([place])    skin cancer - basal cell   Colon polyps    Complication of anesthesia    anxious afterwards, will get headaches    Constipation    Depression    Fibromyalgia 10/2013   GERD (gastroesophageal reflux disease)    Hearing loss on left    Heart murmur    never had any problems   Hemorrhoids    High cholesterol    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   cytology negative   IBS (irritable bowel syndrome)    Insomnia    Lymphocytic colitis    Lymphoma (HCC)    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   Bone marrow biopsy showes 8% plasma cells IgA Lambda   Migraines    Osteoarthritis    Osteopenia    Peripheral neuropathy    PTSD (post-traumatic stress disorder)    Past Surgical History:  Procedure Laterality Date   BONE MARROW BIOPSY Left 12/18/2013   Plasma cell dyscrasia 8% population of plasma cells   BREAST BIOPSY Right    benign stereo   CESAREAN SECTION  49,70,26   CHOLECYSTECTOMY N/A 10/16/2012   Procedure: LAPAROSCOPIC CHOLECYSTECTOMY;  Surgeon: Joyice Faster. Cornett, MD;  Location: [facility];  Service: General;   Laterality: N/A;   COLONOSCOPY     numerous times   DILATION AND CURETTAGE OF UTERUS     ESOPHAGOGASTRODUODENOSCOPY     HEMORRHOID SURGERY  1993   x3   IUD REMOVAL  02/2015   Mirena   LAPAROSCOPIC LYSIS OF ADHESIONS N/A 10/16/2012   Procedure: LAPAROSCOPIC LYSIS OF ADHESIONS;  Surgeon: Marcello Moores A. Cornett, MD;  Location: [facility];  Service: General;  Laterality: N/A;   LAPAROSCOPY N/A 10/16/2012   Procedure: LAPAROSCOPY DIAGNOSTIC;  Surgeon: Joyice Faster. Cornett, MD;  Location: [facility];  Service: General;  Laterality: N/A;   PELVIC LAPAROSCOPY     RADIOLOGY WITH ANESTHESIA N/A 12/16/2015   Procedure: MRI OF BRAIN WITH AND WITHOUT CONTRAST;  Surgeon: Medication Radiologist, MD;  Location: [place];  Service: Radiology;  Laterality: N/A;   SHOULDER SURGERY  2007/2008   SPINE SURGERY  2010   cervical   Patient Active Problem List   Diagnosis Date Noted   GAD (generalized anxiety disorder) 12/26/2017   OCD (obsessive compulsive disorder) 12/26/2017   PTSD (post-traumatic stress disorder) 12/26/2017   DDD (degenerative disc disease), cervical 07/14/2016  Primary osteoarthritis of both feet 07/14/2016   Primary osteoarthritis of both hands 07/14/2016   Other fatigue 07/14/2016   History of IBS 07/14/2016   Osteopenia of multiple sites 07/14/2016   Fibromyalgia 01/13/2016   MGUS (monoclonal gammopathy of unknown significance) 12/23/2013   Chronic cholecystitis without calculus 10/18/2012   Abdominal pain, unspecified site 10/18/2012   Nausea alone 10/18/2012   Unspecified constipation 10/18/2012   Depression 10/18/2012   Anxiety    ASCUS (atypical squamous cells of undetermined significance) on Pap smear    IUD    Hemorrhoids 11/29/2010   Abdominal pain, left upper quadrant 11/29/2010    PCP:  Mayra Neer, MD  REFERRING PROVIDER: Mayra Neer, MD  REFERRING DIAG: fibromyalgia  THERAPY DIAG:  Abnormal posture  Muscle weakness (generalized)  Cramp and  spasm  Cervicalgia  Polymyalgia rheumatica (HCC)  Rationale for Evaluation and Treatment Rehabilitation  ONSET DATE: chronic pain (fibromyalgia) with flare-up 2 months ago  SUBJECTIVE:                                                                                                                                                                                                         SUBJECTIVE STATEMENT: I saw Dr Ronnald Ramp last week.  He ordered a CT scan for neck and NCV study for Rt wrist. I'm wearing a brace to manage the pain in my wrist.   PERTINENT HISTORY:  Anxiety, bipolar disorder, depression, fibromyalgia, IBS, migraines, osteopenia, PTSD  PAIN:  Are you having pain? Yes: NPRS scale: neck: 3-6/10 Pain location: Rt forearm and hand Pain description: sore, sharp, annoying Aggravating factors: activity, yard work, picking up heavy objects Relieving factors: muscle relaxers, rest, stretching  PRECAUTIONS: Other: chronic pain syndrome and depression Other: slow progression with exercise due to chronic condition  WEIGHT BEARING RESTRICTIONS No  FALLS:  Has patient fallen in last 6 months? No  LIVING ENVIRONMENT: Lives with: lives with their family Lives in: House/apartment  OCCUPATION: on disability  PLOF: Independent with basic ADLs and Leisure: yardwork, walking  Pt cares for her 65 young grandchildren- difficulty with care including lifting and carrying  PATIENT GOALS be more active with less pain, exercise at the gym regularly, lift and carry grandchildren and laundry, sleep with fewer interruptions, get back to more gardening.  OBJECTIVE:   DIAGNOSTIC FINDINGS: none recent   PATIENT SURVEYS: Eval: Fibromyalgia Impact Questionnaire: first 2 sections only: 49 12/26/21: 40   COGNITION: Overall cognitive status: Within functional limits for tasks assessed  SENSATION: WFL  POSTURE: rounded shoulders and forward head  PALPATION:  Diffuse palpable tenderness  over bil neck, upper traps, thoracic, lumbar and gluteals with trigger points.    CERVICAL ROM:   Active ROM A/PROM (deg) eval A/ROM 12/26/21  Flexion 55   Extension 40   Right lateral flexion 30 35  Left lateral flexion 35 40  Right rotation 50 60  Left rotation 40 65   (Blank rows = not tested)  UPPER EXTREMITY ROM: UE A/ROM is limited by 20% into flexion and abduction with pain at end range.  Hip flexibility is limited by 25% in all directions with pain in all directions.  UPPER EXTREMITY MMT: UE: 4+/5, LE 4+/5 except hip flexors 4-/5  TODAY'S TREATMENT:  Date: 01/16/22 Trigger Point Dry-Needling  Treatment instructions: Expect mild to moderate muscle soreness. S/S of pneumothorax if dry needled over a lung field, and to seek immediate medical attention should they occur. Patient verbalized understanding of these instructions and education.  Patient Consent Given: Yes Education handout provided: Previously provided Muscles treated: C4-7 multifidi, bil upper traps and levator Treatment response/outcome: Utilized skilled palpation to identify trigger points.  During dry needling able to palpate muscle twitch and muscle elongation  Elongation and mobilization of tissue after DN Skilled palpation and monitoring by PT during dry needling  Date: 01/09/22 Manual: elongation, mobs and passive stretch to Rt wrist flexors and joint  Date: 12/26/21 Trigger Point Dry-Needling  Treatment instructions: Expect mild to moderate muscle soreness. S/S of pneumothorax if dry needled over a lung field, and to seek immediate medical attention should they occur. Patient verbalized understanding of these instructions and education.  Patient Consent Given: Yes Education handout provided: Previously provided Muscles treated: Lt rhomboids, bil thoracic multifidi T5-7 Treatment response/outcome: Utilized skilled palpation to identify trigger points.  During dry needling able to palpate muscle  twitch and muscle elongation  Elongation and mobilization of tissue after DN Skilled palpation and monitoring by PT during dry needling  Date: 12/19/21 Trigger Point Dry-Needling  Treatment instructions: Expect mild to moderate muscle soreness. S/S of pneumothorax if dry needled over a lung field, and to seek immediate medical attention should they occur. Patient verbalized understanding of these instructions and education.  Patient Consent Given: Yes Education handout provided: Previously provided Muscles treated: Rt rhomboids, bil thoracic multifidi T5-7. Bil upper traps  Treatment response/outcome: Utilized skilled palpation to identify trigger points.  During dry needling able to palpate muscle twitch and muscle elongation  Elongation and mobilization of tissue after DN Skilled palpation and monitoring by PT during dry needling     PATIENT EDUCATION:  Education details: Access Code: [email or web address] (review from previous session). Discussed strategies to incorporate more movement into her day considering her depression.  Pt will work to implement these strategies.   Person educated: Patient Education method: Explanation, Demonstration, and Handouts Education comprehension: verbalized understanding and returned demonstration   HOME EXERCISE PROGRAM: Access Code: [email or web address]  ASSESSMENT:  CLINICAL IMPRESSION: Pt with continued yet improved Rt carpal tunnel symptoms.  Pt had good response to manual therapy last session.  Pt reports that hips are tight due to sitting long periods.  Pt responds well to DN and manual therapy to neck with twitch response and improved cervical A/ROM at the end of session.  Pt with chronic and widespread pain and is using exercise and movement between sessions to manage. Patient will benefit from skilled PT to address the below impairments and improve overall function.    OBJECTIVE IMPAIRMENTS decreased activity tolerance, decreased endurance, difficulty walking,  decreased strength, increased  muscle spasms, impaired flexibility, improper body mechanics, postural dysfunction, and pain.   ACTIVITY LIMITATIONS carrying, lifting, sitting, standing, reach over head, hygiene/grooming, and locomotion level  PARTICIPATION LIMITATIONS: meal prep, cleaning, laundry, driving, community activity, and yard work  PERSONAL FACTORS Past/current experiences, Time since onset of injury/illness/exacerbation, and 3+ comorbidities: fibromyalgia depression, anxiety,   are also affecting patient's functional outcome.   REHAB POTENTIAL: Good  CLINICAL DECISION MAKING: Evolving/moderate complexity  EVALUATION COMPLEXITY: Moderate   GOALS: Goals reviewed with patient? Yes  SHORT TERM GOALS: Target date: 01/23/22  Return to regular performance of HEP 2-3x/day to improve mobility  Baseline: stretching 1-2x/day (12/26/21) Goal status: In progress  2.  Report > or = to 25% reduction in cervical and lumbar pain with ADLs and self-care  Baseline: 10% better overall (12/26/21) Goal status:   3.  Reduce fibromyalgia impact score to < or = to 39  Baseline: 40 (12/26/21) Goal status: In progress     LONG TERM GOALS: Target date: 02/20/22  Be independent in advanced HEP Baseline: gentle exercise  Goal status: In progress   2.  Reduce fibromyalgia impact questionnaire to < or = to 29 Baseline: 40 (12/26/21) Goal status: In progress   3.  Lift and carry laundry and her grandchildren with > or = to 50% reduction in pain Baseline: 10% better (12/26/21) Goal status: In progress   4.  Report > or = to 50% reduction in neck and lumbar pain with ADLs and self-care  Baseline: 10% better (12/26/21) Goal status: In progress   5.  Return to yardwork verbalize appropriate rest breaks and mechanics  Baseline: able to do some yard work (12/26/21) Goal status: In progress    PLAN: PT FREQUENCY: 2x/week  PT DURATION: 8 weeks  PLANNED INTERVENTIONS: Therapeutic  exercises, Therapeutic activity, Neuromuscular re-education, Balance training, Gait training, Patient/Family education, Self Care, Joint mobilization, Aquatic Therapy, Dry Needling, Spinal manipulation, Spinal mobilization, Cryotherapy, Moist heat, Taping, Traction, Manual therapy, and Re-evaluation  PLAN FOR NEXT SESSION:  manual as needed, manage exercises as needed.    Sigurd Sos, PT 01/16/22 11:39 AM   [facility] [address], [place], [place] [address] Phone # [phone] Fax [phone]

## 2022-01-17 ENCOUNTER — Ambulatory Visit (INDEPENDENT_AMBULATORY_CARE_PROVIDER_SITE_OTHER): Payer: Medicare Other | Admitting: Psychology

## 2022-01-17 DIAGNOSIS — F902 Attention-deficit hyperactivity disorder, combined type: Secondary | ICD-10-CM

## 2022-01-17 DIAGNOSIS — F5081 Binge eating disorder: Secondary | ICD-10-CM

## 2022-01-17 DIAGNOSIS — F411 Generalized anxiety disorder: Secondary | ICD-10-CM | POA: Diagnosis not present

## 2022-01-17 DIAGNOSIS — F4310 Post-traumatic stress disorder, unspecified: Secondary | ICD-10-CM

## 2022-01-17 DIAGNOSIS — F422 Mixed obsessional thoughts and acts: Secondary | ICD-10-CM | POA: Diagnosis not present

## 2022-01-17 NOTE — Progress Notes (Addendum)
[place] Counselor/Therapist Progress Note  Patient ID: Danielle Bruce, MRN: [id number],    Date: 01/17/2022  Time Spent: 60 minutes  Treatment Type: Individual Therapy  Reported Symptoms: flashbacks, responding to triggers, depression, anxiety, being busy all of the time, dissociation  Mental Status Exam: Appearance:  Casual     Behavior: Appropriate  Motor: Restlestness  Speech/Language:  Clear and Coherent  Affect: Blunt  Mood: anxious  Thought process: normal  Thought content:   WNL  Sensory/Perceptual disturbances:   WNL  Orientation: oriented to person, place, time/date, and situation  Attention: Good  Concentration: Good  Memory: WNL  Fund of knowledge:  Good  Insight:   Good  Judgment:  Fair  Impulse Control: Good   Risk Assessment: Danger to Self:  No Self-injurious Behavior: No Danger to Others: No Duty to Warn:no Physical Aggression / Violence:No  Access to Firearms a concern: No  Gang Involvement:No   Subjective: The patient attended a face-to-face individual therapy session via video visit today.  The patient gave verbal consent for the session to be on video.  We used caregility.  The patient was in her home alone and the therapist was in the office.  We started talking today about the patient being very busy and doing a lot of things for other people and not doing a lot for herself.  We began discussing patterns of behavior of codependency and the patient reports that she has been in codependent relationships much of her life and thought she was doing better with that but she still has some patterns that she needs to be able to recognize.  We talked about this being a pattern that we get into and we can still be trying to do codependent behaviors with people that are relatively healthy.  It does not mean that both people have to be in a codependent situation for it to be a codependent relationship, it can just be 1 person or the other.  The  patient felt that she gained some understanding and insight into some of these behaviors and we talked about her needing to continue to learn how to take care of herself and reframe some things in her mind so that she is not doing things that other people can do for themselves. Interventions: Cognitive Behavioral Therapy, Mindfulness Meditation, Eye Movement Desensitization and Reprocessing (EMDR), Insight-Oriented, and Interpersonal  Diagnosis:PTSD (post-traumatic stress disorder)  Binge-eating disorder, moderate  Mixed obsessional thoughts and acts  Attention deficit hyperactivity disorder (ADHD), combined type  Generalized anxiety disorder  Plan: Plan of Care: Client Abilities/Strengths  Insightful, motivated, Supportive family Client Treatment Preferences  Outpatient Individual therapy/EMDR  Client Statement of Needs  "I think I have PTSD and I feel like I need another kind of therapy than talk therapy" Treatment Level  Outpatient Individual therapy  Symptoms  Demonstrates an exaggerated startle response.:  (Status: maintained). Depressed  or irritable mood.: (Status:maintained). Describes a reliving of the event,  particularly through dissociative flashbacks.: (Status: maintained). Displays a  significant decline in interest and engagement in activities.:  (Status: maintained). Displays significant psychological and/or physiological distress resulting from internal and external  clues that are reminiscent of the traumatic event.: (Status: maintained).  Experiences disturbances in sleep.:  (Status: maintained). Experiences disturbing  and persistent thoughts, images, and/or perceptions of the traumatic event.:  (Status: maintained). Experiences frequent nightmares.: (Status: maintained).  Feelings of hopelessness, worthlessness, or inappropriate guilt.: No Description Entered (Status:  improved). Has been exposed to a traumatic  event involving actual or perceived threat of death  or  serious injury.:  (Status: maintained). Impairment in social, occupational, or  other areas of functioning.:(Status: maintained). Intentionally avoids activities,  places, people, or objects (e.g., up-armored vehicles) that evoke memories of the event.: (Status: maintained). Intentionally avoids thoughts, feelings, or discussions related  to the traumatic event.: (Status: maintained). Reports difficulty concentrating as  well as feelings of guilt (Status:maintained). Reports response of intense fear,  helplessness, or horror to the traumatic event.:  (Status: maintained).  Problems Addressed  Unipolar Depression, Posttraumatic Stress Disorder (PTSD), Posttraumatic Stress Disorder (PTSD),   Posttraumatic Stress Disorder (PTSD), Posttraumatic Stress Disorder (PTSD)  Goals 1. Develop healthy thinking patterns and beliefs about self, others, and the world that lead to the alleviation and help prevent the relapse of  depression. Objective Identify and replace thoughts and beliefs that support depression. Target Date: 2024/11/07Frequency: Weekly Progress: 0 Modality: individual Related Interventions 1. Explore and restructure underlying assumptions and beliefs reflected in biased self-talk that  may put the client at risk for relapse or recurrence. 2. Conduct Cognitive-Behavioral Therapy (see Cognitive Behavior Therapy by Olevia Bowens; Overcoming Depression by Lynita Lombard al.), beginning with helping the client learn the connection among  cognition, depressive feelings, and actions. 2. Eliminate or reduce the negative impact trauma related symptoms have  on social, occupational, and family functioning. Objective Learn and implement personal skills to manage challenging situations related to trauma. Target Date: 2023/01/04 Frequency: Weekly Progress: 0 Modality: individual 3. No longer avoids persons, places, activities, and objects that are  reminiscent of the traumatic  event. Objective Participate in Eye Movement Desensitization and Reprocessing (EMDR) to reduce emotional distress  related to traumatic thoughts, feelings, and images. Target Date: 2023/01/04 Frequency: Weekly Progress: 0 Modality: individual   Related Interventions 1. Utilize Eye Movement Desensitization and Reprocessing (EMDR) to reduce the client's  emotional reactivity to the traumatic event and reduce PTSD symptoms. Objective Learn and implement guided self-dialogue to manage thoughts, feelings, and urges brought on by  encounters with trauma-related situations. Target Date: 2024-11/07 Frequency: Weekly Progress: 0 Modality: individual Related Interventions 1. Teach the client a guided self-dialogue procedure in which he/she learns to recognize  maladaptive self-talk, challenges its biases, copes with engendered feelings, overcomes  avoidance, and reinforces his/her accomplishments; review and reinforce progress, problemsolve obstacles. 4. No longer experiences intrusive event recollections, avoidance of event  reminders, intense arousal, or disinterest in activities or  relationships. 5. Thinks about or openly discusses the traumatic event with others  without experiencing psychological or physiological distress. Diagnosis Axis  none F43.10 (Posttraumatic stress disorder) - Open - [Signifier: n/a] Posttraumatic Stress  Disorder  Conditions For Discharge Achievement of treatment goals and objectives   Lya Holben G Udell Mazzocco, LCSW

## 2022-01-24 ENCOUNTER — Other Ambulatory Visit: Payer: Self-pay | Admitting: Family Medicine

## 2022-01-24 ENCOUNTER — Ambulatory Visit: Payer: Self-pay | Admitting: Psychology

## 2022-01-24 DIAGNOSIS — E538 Deficiency of other specified B group vitamins: Secondary | ICD-10-CM | POA: Diagnosis not present

## 2022-01-24 DIAGNOSIS — H6690 Otitis media, unspecified, unspecified ear: Secondary | ICD-10-CM | POA: Diagnosis not present

## 2022-01-24 DIAGNOSIS — R1310 Dysphagia, unspecified: Secondary | ICD-10-CM | POA: Diagnosis not present

## 2022-01-24 DIAGNOSIS — K219 Gastro-esophageal reflux disease without esophagitis: Secondary | ICD-10-CM | POA: Diagnosis not present

## 2022-01-24 DIAGNOSIS — G43909 Migraine, unspecified, not intractable, without status migrainosus: Secondary | ICD-10-CM | POA: Diagnosis not present

## 2022-01-24 DIAGNOSIS — J069 Acute upper respiratory infection, unspecified: Secondary | ICD-10-CM | POA: Diagnosis not present

## 2022-01-30 ENCOUNTER — Ambulatory Visit: Payer: Medicare Other | Attending: Family Medicine

## 2022-01-30 DIAGNOSIS — M353 Polymyalgia rheumatica: Secondary | ICD-10-CM | POA: Diagnosis not present

## 2022-01-30 DIAGNOSIS — R252 Cramp and spasm: Secondary | ICD-10-CM | POA: Insufficient documentation

## 2022-01-30 DIAGNOSIS — M542 Cervicalgia: Secondary | ICD-10-CM | POA: Insufficient documentation

## 2022-01-30 DIAGNOSIS — M6281 Muscle weakness (generalized): Secondary | ICD-10-CM | POA: Diagnosis not present

## 2022-01-30 DIAGNOSIS — R293 Abnormal posture: Secondary | ICD-10-CM | POA: Insufficient documentation

## 2022-01-30 NOTE — Therapy (Signed)
OUTPATIENT PHYSICAL THERAPY TREATMENT   Patient Name: Danielle Bruce MRN: [id number] DOB:17-Apr-1960, 61 y.o., female Today's Date: 01/30/2022      PT End of Session - 01/30/22 1659     Visit Number 13    Date for PT Re-Evaluation 02/20/22    Authorization Type Medicare B- needs KX modifier now    Progress Note Due on Visit 20    PT Start Time 1616    PT Stop Time 1658    PT Time Calculation (min) 42 min    Activity Tolerance Patient tolerated treatment well    Behavior During Therapy [facility] for tasks assessed/performed                        Past Medical History:  Diagnosis Date   Anemia    Anxiety    Arthritis    osteoarthritis   ASCUS (atypical squamous cells of undetermined significance) on Pap smear 05/06/2005   NEG HIGH RISK HPV--C&B BIOPSY BENIGN 12/2005   Asthma    Bipolar 2 disorder ([place])    Cancer ([place])    skin cancer - basal cell   Colon polyps    Complication of anesthesia    anxious afterwards, will get headaches    Constipation    Depression    Fibromyalgia 10/2013   GERD (gastroesophageal reflux disease)    Hearing loss on left    Heart murmur    never had any problems   Hemorrhoids    High cholesterol    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   cytology negative   IBS (irritable bowel syndrome)    Insomnia    Lymphocytic colitis    Lymphoma (H[facility])    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   Bone marrow biopsy showes 8% plasma cells IgA Lambda   Migraines    Osteoarthritis    Osteopenia    Peripheral neuropathy    PTSD (post-traumatic stress disorder)    Past Surgical History:  Procedure Laterality Date   BONE MARROW BIOPSY Left 12/18/2013   Plasma cell dyscrasia 8% population of plasma cells   BREAST BIOPSY Right    benign stereo   CESAREAN SECTION  93,57,01   CHOLECYSTECTOMY N/A 10/16/2012   Procedure: LAPAROSCOPIC CHOLECYSTECTOMY;  Surgeon: Joyice Faster. Cornett, MD;  Location: [facility];  Service: General;   Laterality: N/A;   COLONOSCOPY     numerous times   DILATION AND CURETTAGE OF UTERUS     ESOPHAGOGASTRODUODENOSCOPY     HEMORRHOID SURGERY  1993   x3   IUD REMOVAL  02/2015   Mirena   LAPAROSCOPIC LYSIS OF ADHESIONS N/A 10/16/2012   Procedure: LAPAROSCOPIC LYSIS OF ADHESIONS;  Surgeon: Marcello Moores A. Cornett, MD;  Location: [facility];  Service: General;  Laterality: N/A;   LAPAROSCOPY N/A 10/16/2012   Procedure: LAPAROSCOPY DIAGNOSTIC;  Surgeon: Joyice Faster. Cornett, MD;  Location: [facility];  Service: General;  Laterality: N/A;   PELVIC LAPAROSCOPY     RADIOLOGY WITH ANESTHESIA N/A 12/16/2015   Procedure: MRI OF BRAIN WITH AND WITHOUT CONTRAST;  Surgeon: Medication Radiologist, MD;  Location: [place] [facility];  Service: Radiology;  Laterality: N/A;   SHOULDER SURGERY  2007/2008   SPINE SURGERY  2010   cervical   Patient Active Problem List   Diagnosis Date Noted   GAD (generalized anxiety disorder) 12/26/2017   OCD (obsessive compulsive disorder) 12/26/2017   PTSD (post-traumatic stress disorder) 12/26/2017   DDD (degenerative disc disease), cervical 07/14/2016  Primary osteoarthritis of both feet 07/14/2016   Primary osteoarthritis of both hands 07/14/2016   Other fatigue 07/14/2016   History of IBS 07/14/2016   Osteopenia of multiple sites 07/14/2016   Fibromyalgia 01/13/2016   MGUS (monoclonal gammopathy of unknown significance) 12/23/2013   Chronic cholecystitis without calculus 10/18/2012   Abdominal pain, unspecified site 10/18/2012   Nausea alone 10/18/2012   Unspecified constipation 10/18/2012   Depression 10/18/2012   Anxiety    ASCUS (atypical squamous cells of undetermined significance) on Pap smear    IUD    Hemorrhoids 11/29/2010   Abdominal pain, left upper quadrant 11/29/2010    PCP:  Mayra Neer, MD  REFERRING PROVIDER: Mayra Neer, MD  REFERRING DIAG: fibromyalgia  THERAPY DIAG:  Abnormal posture  Muscle weakness (generalized)  Cramp and  spasm  Cervicalgia  Rationale for Evaluation and Treatment Rehabilitation  ONSET DATE: chronic pain (fibromyalgia) with flare-up 2 months ago  SUBJECTIVE:                                                                                                                                                                                                         SUBJECTIVE STATEMENT: I was sick after Thanksgiving. Today is the first day that I have been out.  I have been resting a lot.  My Lt neck is hurting.    PERTINENT HISTORY:  Anxiety, bipolar disorder, depression, fibromyalgia, IBS, migraines, osteopenia, PTSD  PAIN:  Are you having pain? Yes: NPRS scale: neck: 5/10 Pain location: Lt>Rt neck Pain description: sore, sharp, annoying Aggravating factors: activity, yard work, picking up heavy objects Relieving factors: muscle relaxers, rest, stretching  PRECAUTIONS: Other: chronic pain syndrome and depression Other: slow progression with exercise due to chronic condition  WEIGHT BEARING RESTRICTIONS No  FALLS:  Has patient fallen in last 6 months? No  LIVING ENVIRONMENT: Lives with: lives with their family Lives in: House/apartment  OCCUPATION: on disability  PLOF: Independent with basic ADLs and Leisure: yardwork, walking  Pt cares for her 53 young grandchildren- difficulty with care including lifting and carrying  PATIENT GOALS be more active with less pain, exercise at the gym regularly, lift and carry grandchildren and laundry, sleep with fewer interruptions, get back to more gardening.  OBJECTIVE:   DIAGNOSTIC FINDINGS: none recent   PATIENT SURVEYS: Eval: Fibromyalgia Impact Questionnaire: first 2 sections only: 49 12/26/21: 40   COGNITION: Overall cognitive status: Within functional limits for tasks assessed  SENSATION: WFL  POSTURE: rounded shoulders and forward head  PALPATION: Diffuse palpable tenderness over bil neck, upper traps,  thoracic, lumbar and  gluteals with trigger points.    CERVICAL ROM:   Active ROM A/PROM (deg) eval A/ROM 12/26/21  Flexion 55   Extension 40   Right lateral flexion 30 35  Left lateral flexion 35 40  Right rotation 50 60  Left rotation 40 65   (Blank rows = not tested)  UPPER EXTREMITY ROM: UE A/ROM is limited by 20% into flexion and abduction with pain at end range.  Hip flexibility is limited by 25% in all directions with pain in all directions.  UPPER EXTREMITY MMT: UE: 4+/5, LE 4+/5 except hip flexors 4-/5  TODAY'S TREATMENT:  Date: 01/30/22 NuStep: level 3x 6 minutes-PT present to discuss progress Open book x10 bil Childs pose and lateral childs pose x20 seconds each  Trigger Point Dry-Needling  Treatment instructions: Expect mild to moderate muscle soreness. S/S of pneumothorax if dry needled over a lung field, and to seek immediate medical attention should they occur. Patient verbalized understanding of these instructions and education.  Patient Consent Given: Yes Education handout provided: Previously provided Muscles treated: C4-7 multifidi, bil upper traps and levator Treatment response/outcome: Utilized skilled palpation to identify trigger points.  During dry needling able to palpate muscle twitch and muscle elongation  Elongation and mobilization of tissue after DN Skilled palpation and monitoring by PT during dry needling  Date: 01/16/22 Trigger Point Dry-Needling  Treatment instructions: Expect mild to moderate muscle soreness. S/S of pneumothorax if dry needled over a lung field, and to seek immediate medical attention should they occur. Patient verbalized understanding of these instructions and education.  Patient Consent Given: Yes Education handout provided: Previously provided Muscles treated: C4-7 multifidi, bil upper traps and levator Treatment response/outcome: Utilized skilled palpation to identify trigger points.  During dry needling able to palpate muscle twitch and  muscle elongation  Elongation and mobilization of tissue after DN Skilled palpation and monitoring by PT during dry needling  Date: 01/09/22 Manual: elongation, mobs and passive stretch to Rt wrist flexors and joint  Date: 12/26/21 Trigger Point Dry-Needling  Treatment instructions: Expect mild to moderate muscle soreness. S/S of pneumothorax if dry needled over a lung field, and to seek immediate medical attention should they occur. Patient verbalized understanding of these instructions and education.  Patient Consent Given: Yes Education handout provided: Previously provided Muscles treated: Lt rhomboids, bil thoracic multifidi T5-7 Treatment response/outcome: Utilized skilled palpation to identify trigger points.  During dry needling able to palpate muscle twitch and muscle elongation  Elongation and mobilization of tissue after DN Skilled palpation and monitoring by PT during dry needling    PATIENT EDUCATION:  Education details: Access Code: [email or web address] (review from previous session). Discussed strategies to incorporate more movement into her day considering her depression.  Pt will work to implement these strategies.   Person educated: Patient Education method: Explanation, Demonstration, and Handouts Education comprehension: verbalized understanding and returned demonstration   HOME EXERCISE PROGRAM: Access Code: [email or web address]  ASSESSMENT:  CLINICAL IMPRESSION: Pt has been sick for 1.5 weeks and not able to move much due to this.  Pt was able to participate in gentle exercise without increased pain.  Pt with trigger points in Lt>Rt neck and responded well to manual therapy with improved tissue mobility and reduced pain at the end of session.  Patient will benefit from skilled PT to address the below impairments and improve overall function.    OBJECTIVE IMPAIRMENTS decreased activity tolerance, decreased endurance, difficulty walking, decreased strength, increased muscle spasms,  impaired flexibility, improper  body mechanics, postural dysfunction, and pain.   ACTIVITY LIMITATIONS carrying, lifting, sitting, standing, reach over head, hygiene/grooming, and locomotion level  PARTICIPATION LIMITATIONS: meal prep, cleaning, laundry, driving, community activity, and yard work  PERSONAL FACTORS Past/current experiences, Time since onset of injury/illness/exacerbation, and 3+ comorbidities: fibromyalgia depression, anxiety,   are also affecting patient's functional outcome.   REHAB POTENTIAL: Good  CLINICAL DECISION MAKING: Evolving/moderate complexity  EVALUATION COMPLEXITY: Moderate   GOALS: Goals reviewed with patient? Yes  SHORT TERM GOALS: Target date: 01/23/22  Return to regular performance of HEP 2-3x/day to improve mobility  Baseline: stretching 1-2x/day (12/26/21) Goal status: In progress  2.  Report > or = to 25% reduction in cervical and lumbar pain with ADLs and self-care  Baseline: 10% better overall (12/26/21) Goal status:   3.  Reduce fibromyalgia impact score to < or = to 39  Baseline: 40 (12/26/21) Goal status: In progress     LONG TERM GOALS: Target date: 02/20/22  Be independent in advanced HEP Baseline: gentle exercise  Goal status: In progress   2.  Reduce fibromyalgia impact questionnaire to < or = to 29 Baseline: 40 (12/26/21) Goal status: In progress   3.  Lift and carry laundry and her grandchildren with > or = to 50% reduction in pain Baseline: 10% better (12/26/21) Goal status: In progress   4.  Report > or = to 50% reduction in neck and lumbar pain with ADLs and self-care  Baseline: 10% better (12/26/21) Goal status: In progress   5.  Return to yardwork verbalize appropriate rest breaks and mechanics  Baseline: able to do some yard work (12/26/21) Goal status: In progress    PLAN: PT FREQUENCY: 2x/week  PT DURATION: 8 weeks  PLANNED INTERVENTIONS: Therapeutic exercises, Therapeutic activity, Neuromuscular  re-education, Balance training, Gait training, Patient/Family education, Self Care, Joint mobilization, Aquatic Therapy, Dry Needling, Spinal manipulation, Spinal mobilization, Cryotherapy, Moist heat, Taping, Traction, Manual therapy, and Re-evaluation  PLAN FOR NEXT SESSION:  manual as needed, manage exercises as needed.    Sigurd Sos, PT 01/30/22 5:00 PM   [facility] [address], [place], [place] [address] Phone # [phone] Fax [phone]

## 2022-01-31 ENCOUNTER — Ambulatory Visit (INDEPENDENT_AMBULATORY_CARE_PROVIDER_SITE_OTHER): Payer: Medicare Other | Admitting: Psychology

## 2022-01-31 DIAGNOSIS — F902 Attention-deficit hyperactivity disorder, combined type: Secondary | ICD-10-CM

## 2022-01-31 DIAGNOSIS — F411 Generalized anxiety disorder: Secondary | ICD-10-CM | POA: Diagnosis not present

## 2022-01-31 DIAGNOSIS — F428 Other obsessive-compulsive disorder: Secondary | ICD-10-CM

## 2022-01-31 DIAGNOSIS — F4310 Post-traumatic stress disorder, unspecified: Secondary | ICD-10-CM | POA: Diagnosis not present
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
## 2022-01-31 NOTE — Therapy (Unsigned)
OUTPATIENT PHYSICAL THERAPY TREATMENT   Patient Name: Danielle Bruce MRN: [id number] DOB:14-Nov-1960, 61 y.o., female Today's Date: 02/01/2022      PT End of Session - 02/01/22 2048     Visit Number 14    Date for PT Re-Evaluation 02/20/22    Authorization Type Medicare B- needs KX modifier now    Progress Note Due on Visit 20    PT Start Time 1100    PT Stop Time 1145    PT Time Calculation (min) 45 min    Activity Tolerance Patient tolerated treatment well    Behavior During Therapy [facility] for tasks assessed/performed                        Past Medical History:  Diagnosis Date   Anemia    Anxiety    Arthritis    osteoarthritis   ASCUS (atypical squamous cells of undetermined significance) on Pap smear 05/06/2005   NEG HIGH RISK HPV--C&B BIOPSY BENIGN 12/2005   Asthma    Bipolar 2 disorder (HCC)    Cancer (HCC)    skin cancer - basal cell   Colon polyps    Complication of anesthesia    anxious afterwards, will get headaches    Constipation    Depression    Fibromyalgia 10/2013   GERD (gastroesophageal reflux disease)    Hearing loss on left    Heart murmur    never had any problems   Hemorrhoids    High cholesterol    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   cytology negative   IBS (irritable bowel syndrome)    Insomnia    Lymphocytic colitis    Lymphoma (HCC)    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   Bone marrow biopsy showes 8% plasma cells IgA Lambda   Migraines    Osteoarthritis    Osteopenia    Peripheral neuropathy    PTSD (post-traumatic stress disorder)    Past Surgical History:  Procedure Laterality Date   BONE MARROW BIOPSY Left 12/18/2013   Plasma cell dyscrasia 8% population of plasma cells   BREAST BIOPSY Right    benign stereo   CESAREAN SECTION  40,98,11   CHOLECYSTECTOMY N/A 10/16/2012   Procedure: LAPAROSCOPIC CHOLECYSTECTOMY;  Surgeon: Clovis Pu. Cornett, MD;  Location: [facility];  Service: General;   Laterality: N/A;   COLONOSCOPY     numerous times   DILATION AND CURETTAGE OF UTERUS     ESOPHAGOGASTRODUODENOSCOPY     HEMORRHOID SURGERY  1993   x3   IUD REMOVAL  02/2015   Mirena   LAPAROSCOPIC LYSIS OF ADHESIONS N/A 10/16/2012   Procedure: LAPAROSCOPIC LYSIS OF ADHESIONS;  Surgeon: Maisie Fus A. Cornett, MD;  Location: [facility];  Service: General;  Laterality: N/A;   LAPAROSCOPY N/A 10/16/2012   Procedure: LAPAROSCOPY DIAGNOSTIC;  Surgeon: Clovis Pu. Cornett, MD;  Location: [facility];  Service: General;  Laterality: N/A;   PELVIC LAPAROSCOPY     RADIOLOGY WITH ANESTHESIA N/A 12/16/2015   Procedure: MRI OF BRAIN WITH AND WITHOUT CONTRAST;  Surgeon: Medication Radiologist, MD;  Location: [facility];  Service: Radiology;  Laterality: N/A;   SHOULDER SURGERY  2007/2008   SPINE SURGERY  2010   cervical   Patient Active Problem List   Diagnosis Date Noted   GAD (generalized anxiety disorder) 12/26/2017   OCD (obsessive compulsive disorder) 12/26/2017   PTSD (post-traumatic stress disorder) 12/26/2017   DDD (degenerative disc disease), cervical 07/14/2016  Primary osteoarthritis of both feet 07/14/2016   Primary osteoarthritis of both hands 07/14/2016   Other fatigue 07/14/2016   History of IBS 07/14/2016   Osteopenia of multiple sites 07/14/2016   Fibromyalgia 01/13/2016   MGUS (monoclonal gammopathy of unknown significance) 12/23/2013   Chronic cholecystitis without calculus 10/18/2012   Abdominal pain, unspecified site 10/18/2012   Nausea alone 10/18/2012   Unspecified constipation 10/18/2012   Depression 10/18/2012   Anxiety    ASCUS (atypical squamous cells of undetermined significance) on Pap smear    IUD    Hemorrhoids 11/29/2010   Abdominal pain, left upper quadrant 11/29/2010    PCP:  Lupita Raider, MD  REFERRING PROVIDER: Lupita Raider, MD  REFERRING DIAG: fibromyalgia  THERAPY DIAG:  Abnormal posture  Muscle weakness (generalized)  Cramp and  spasm  Cervicalgia  Rationale for Evaluation and Treatment Rehabilitation  ONSET DATE: chronic pain (fibromyalgia) with flare-up 2 months ago  SUBJECTIVE:                                                                                                                                                                                                         SUBJECTIVE STATEMENT: I am feeling much better post sickness. I really need to improve my consistency with exercise.   PERTINENT HISTORY:  Anxiety, bipolar disorder, depression, fibromyalgia, IBS, migraines, osteopenia, PTSD  PAIN:  Are you having pain? Yes: NPRS scale: neck: 5/10 Pain location: Lt>Rt neck Pain description: sore, sharp, annoying Aggravating factors: activity, yard work, picking up heavy objects Relieving factors: muscle relaxers, rest, stretching  PRECAUTIONS: Other: chronic pain syndrome and depression Other: slow progression with exercise due to chronic condition  WEIGHT BEARING RESTRICTIONS No  FALLS:  Has patient fallen in last 6 months? No  LIVING ENVIRONMENT: Lives with: lives with their family Lives in: House/apartment  OCCUPATION: on disability  PLOF: Independent with basic ADLs and Leisure: yardwork, walking  Pt cares for her 4 young grandchildren- difficulty with care including lifting and carrying  PATIENT GOALS be more active with less pain, exercise at the gym regularly, lift and carry grandchildren and laundry, sleep with fewer interruptions, get back to more gardening.  OBJECTIVE:   DIAGNOSTIC FINDINGS: none recent   PATIENT SURVEYS: Eval: Fibromyalgia Impact Questionnaire: first 2 sections only: 49 12/26/21: 40   COGNITION: Overall cognitive status: Within functional limits for tasks assessed  SENSATION: WFL  POSTURE: rounded shoulders and forward head  PALPATION: Diffuse palpable tenderness over bil neck, upper traps, thoracic, lumbar and gluteals with trigger points.     CERVICAL ROM:  Active ROM A/PROM (deg) eval A/ROM 12/26/21  Flexion 55   Extension 40   Right lateral flexion 30 35  Left lateral flexion 35 40  Right rotation 50 60  Left rotation 40 65   (Blank rows = not tested)  UPPER EXTREMITY ROM: UE A/ROM is limited by 20% into flexion and abduction with pain at end range.  Hip flexibility is limited by 25% in all directions with pain in all directions.  UPPER EXTREMITY MMT: UE: 4+/5, LE 4+/5 except hip flexors 4-/5  TODAY'S TREATMENT:   02/01/22:Pt arrives for aquatic physical therapy. Treatment took place in 3.5-5.5 feet of water. Water temperature was 91 degrees f.. Pt entered the pool via stairs with mild use of the rails. Pt requires buoyancy of water for support and to offload joints with strengthening exercises.  Seated water bench with 75% submersion Pt performed seated LE AROM exercises 20x in all planes, concurrent discussion of current status. 75% depth water walking with UE buoy weights reciprocally 10x in each direction, high knee marching 20x, holding water bells by side in tandem stance: UE 10x saggital plane Bil. Hip 3 ways 15x each Bil trying not to hold on for balance but requires intermittent UE dabs. Standing at wall core press with single buoy UE weight 2x10 followed by 10 min decompression float and bicycle.  Date: 01/30/22 NuStep: level 3x 6 minutes-PT present to discuss progress Open book x10 bil Childs pose and lateral childs pose x20 seconds each  Trigger Point Dry-Needling  Treatment instructions: Expect mild to moderate muscle soreness. S/S of pneumothorax if dry needled over a lung field, and to seek immediate medical attention should they occur. Patient verbalized understanding of these instructions and education.  Patient Consent Given: Yes Education handout provided: Previously provided Muscles treated: C4-7 multifidi, bil upper traps and levator Treatment response/outcome: Utilized skilled palpation to  identify trigger points.  During dry needling able to palpate muscle twitch and muscle elongation  Elongation and mobilization of tissue after DN Skilled palpation and monitoring by PT during dry needling  Date: 01/16/22 Trigger Point Dry-Needling  Treatment instructions: Expect mild to moderate muscle soreness. S/S of pneumothorax if dry needled over a lung field, and to seek immediate medical attention should they occur. Patient verbalized understanding of these instructions and education.  Patient Consent Given: Yes Education handout provided: Previously provided Muscles treated: C4-7 multifidi, bil upper traps and levator Treatment response/outcome: Utilized skilled palpation to identify trigger points.  During dry needling able to palpate muscle twitch and muscle elongation  Elongation and mobilization of tissue after DN Skilled palpation and monitoring by PT during dry needling  Date: 01/09/22 Manual: elongation, mobs and passive stretch to Rt wrist flexors and joint  Date: 12/26/21 Trigger Point Dry-Needling  Treatment instructions: Expect mild to moderate muscle soreness. S/S of pneumothorax if dry needled over a lung field, and to seek immediate medical attention should they occur. Patient verbalized understanding of these instructions and education.  Patient Consent Given: Yes Education handout provided: Previously provided Muscles treated: Lt rhomboids, bil thoracic multifidi T5-7 Treatment response/outcome: Utilized skilled palpation to identify trigger points.  During dry needling able to palpate muscle twitch and muscle elongation  Elongation and mobilization of tissue after DN Skilled palpation and monitoring by PT during dry needling    PATIENT EDUCATION:  Education details: Access Code: [email or web address] (review from previous session). Discussed strategies to incorporate more movement into her day considering her depression.  Pt will work to implement these strategies.  Person  educated: Patient Education method: Explanation, Demonstration, and Handouts Education comprehension: verbalized understanding and returned demonstration   HOME EXERCISE PROGRAM: Access Code: [email or web address]  ASSESSMENT:  CLINICAL IMPRESSION: Pt has been sick for 1.5 weeks and not able to move much due to this.  Pt was able to return to aquatics today and complete her exercises without increased pain.    OBJECTIVE IMPAIRMENTS decreased activity tolerance, decreased endurance, difficulty walking, decreased strength, increased muscle spasms, impaired flexibility, improper body mechanics, postural dysfunction, and pain.   ACTIVITY LIMITATIONS carrying, lifting, sitting, standing, reach over head, hygiene/grooming, and locomotion level  PARTICIPATION LIMITATIONS: meal prep, cleaning, laundry, driving, community activity, and yard work  PERSONAL FACTORS Past/current experiences, Time since onset of injury/illness/exacerbation, and 3+ comorbidities: fibromyalgia depression, anxiety,   are also affecting patient's functional outcome.   REHAB POTENTIAL: Good  CLINICAL DECISION MAKING: Evolving/moderate complexity  EVALUATION COMPLEXITY: Moderate   GOALS: Goals reviewed with patient? Yes  SHORT TERM GOALS: Target date: 01/23/22  Return to regular performance of HEP 2-3x/day to improve mobility  Baseline: stretching 1-2x/day (12/26/21) Goal status: In progress  2.  Report > or = to 25% reduction in cervical and lumbar pain with ADLs and self-care  Baseline: 10% better overall (12/26/21) Goal status:   3.  Reduce fibromyalgia impact score to < or = to 39  Baseline: 40 (12/26/21) Goal status: In progress     LONG TERM GOALS: Target date: 02/20/22  Be independent in advanced HEP Baseline: gentle exercise  Goal status: In progress   2.  Reduce fibromyalgia impact questionnaire to < or = to 29 Baseline: 40 (12/26/21) Goal status: In progress   3.  Lift and carry laundry and her  grandchildren with > or = to 50% reduction in pain Baseline: 10% better (12/26/21) Goal status: In progress   4.  Report > or = to 50% reduction in neck and lumbar pain with ADLs and self-care  Baseline: 10% better (12/26/21) Goal status: In progress   5.  Return to yardwork verbalize appropriate rest breaks and mechanics  Baseline: able to do some yard work (12/26/21) Goal status: In progress    PLAN: PT FREQUENCY: 2x/week  PT DURATION: 8 weeks  PLANNED INTERVENTIONS: Therapeutic exercises, Therapeutic activity, Neuromuscular re-education, Balance training, Gait training, Patient/Family education, Self Care, Joint mobilization, Aquatic Therapy, Dry Needling, Spinal manipulation, Spinal mobilization, Cryotherapy, Moist heat, Taping, Traction, Manual therapy, and Re-evaluation  PLAN FOR NEXT SESSION:  manual as needed, manage exercises as needed.    Ane Payment, PTA 02/01/22 8:58 PM    [facility] [address] [place], [place] [address] Phone # [phone] Fax [phone]

## 2022-02-01 ENCOUNTER — Ambulatory Visit: Payer: Medicare Other | Admitting: Physical Therapy

## 2022-02-01 ENCOUNTER — Encounter: Payer: Self-pay | Admitting: Physical Therapy

## 2022-02-01 DIAGNOSIS — M6281 Muscle weakness (generalized): Secondary | ICD-10-CM | POA: Diagnosis not present

## 2022-02-01 DIAGNOSIS — R293 Abnormal posture: Secondary | ICD-10-CM | POA: Diagnosis not present

## 2022-02-01 DIAGNOSIS — M542 Cervicalgia: Secondary | ICD-10-CM

## 2022-02-01 DIAGNOSIS — R252 Cramp and spasm: Secondary | ICD-10-CM | POA: Diagnosis not present

## 2022-02-01 DIAGNOSIS — M353 Polymyalgia rheumatica: Secondary | ICD-10-CM | POA: Diagnosis not present

## 2022-02-03 ENCOUNTER — Ambulatory Visit
Admission: RE | Admit: 2022-02-03 | Discharge: 2022-02-03 | Disposition: A | Discharge status: Home / Self Care | Payer: Medicare Other | Source: Ambulatory Visit | Attending: Family Medicine | Admitting: Family Medicine

## 2022-02-03 DIAGNOSIS — R1310 Dysphagia, unspecified: Secondary | ICD-10-CM | POA: Diagnosis not present

## 2022-02-03 DIAGNOSIS — K224 Dyskinesia of esophagus: Secondary | ICD-10-CM | POA: Diagnosis not present

## 2022-02-06 ENCOUNTER — Encounter: Payer: Self-pay | Admitting: Physical Therapy

## 2022-02-06 ENCOUNTER — Ambulatory Visit: Payer: Medicare Other | Admitting: Physical Therapy

## 2022-02-06 DIAGNOSIS — M6281 Muscle weakness (generalized): Secondary | ICD-10-CM | POA: Diagnosis not present

## 2022-02-06 DIAGNOSIS — M542 Cervicalgia: Secondary | ICD-10-CM | POA: Diagnosis not present

## 2022-02-06 DIAGNOSIS — R252 Cramp and spasm: Secondary | ICD-10-CM | POA: Diagnosis not present

## 2022-02-06 DIAGNOSIS — R293 Abnormal posture: Secondary | ICD-10-CM

## 2022-02-06 DIAGNOSIS — M353 Polymyalgia rheumatica: Secondary | ICD-10-CM | POA: Diagnosis not present

## 2022-02-06 NOTE — Therapy (Signed)
OUTPATIENT PHYSICAL THERAPY TREATMENT   Patient Name: Danielle Bruce MRN: [id number] DOB:05/08/1960, 61 y.o., female Today's Date: 02/06/2022      PT End of Session - 02/06/22 1100     Visit Number 15    Date for PT Re-Evaluation 02/20/22    Authorization Type Medicare B- needs KX modifier now    Progress Note Due on Visit 20    PT Start Time 1100    PT Stop Time 1150    PT Time Calculation (min) 50 min    Activity Tolerance Patient tolerated treatment well    Behavior During Therapy [facility] for tasks assessed/performed                        Past Medical History:  Diagnosis Date   Anemia    Anxiety    Arthritis    osteoarthritis   ASCUS (atypical squamous cells of undetermined significance) on Pap smear 05/06/2005   NEG HIGH RISK HPV--C&B BIOPSY BENIGN 12/2005   Asthma    Bipolar 2 disorder ([place])    Cancer ([place])    skin cancer - basal cell   Colon polyps    Complication of anesthesia    anxious afterwards, will get headaches    Constipation    Depression    Fibromyalgia 10/2013   GERD (gastroesophageal reflux disease)    Hearing loss on left    Heart murmur    never had any problems   Hemorrhoids    High cholesterol    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   cytology negative   IBS (irritable bowel syndrome)    Insomnia    Lymphocytic colitis    Lymphoma ([facility])    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   Bone marrow biopsy showes 8% plasma cells IgA Lambda   Migraines    Osteoarthritis    Osteopenia    Peripheral neuropathy    PTSD (post-traumatic stress disorder)    Past Surgical History:  Procedure Laterality Date   BONE MARROW BIOPSY Left 12/18/2013   Plasma cell dyscrasia 8% population of plasma cells   BREAST BIOPSY Right    benign stereo   CESAREAN SECTION  65,79,03   CHOLECYSTECTOMY N/A 10/16/2012   Procedure: LAPAROSCOPIC CHOLECYSTECTOMY;  Surgeon: Joyice Faster. Cornett, MD;  Location: [facility];  Service: General;   Laterality: N/A;   COLONOSCOPY     numerous times   DILATION AND CURETTAGE OF UTERUS     ESOPHAGOGASTRODUODENOSCOPY     HEMORRHOID SURGERY  1993   x3   IUD REMOVAL  02/2015   Mirena   LAPAROSCOPIC LYSIS OF ADHESIONS N/A 10/16/2012   Procedure: LAPAROSCOPIC LYSIS OF ADHESIONS;  Surgeon: Marcello Moores A. Cornett, MD;  Location: [facility];  Service: General;  Laterality: N/A;   LAPAROSCOPY N/A 10/16/2012   Procedure: LAPAROSCOPY DIAGNOSTIC;  Surgeon: Joyice Faster. Cornett, MD;  Location: [facility];  Service: General;  Laterality: N/A;   PELVIC LAPAROSCOPY     RADIOLOGY WITH ANESTHESIA N/A 12/16/2015   Procedure: MRI OF BRAIN WITH AND WITHOUT CONTRAST;  Surgeon: Medication Radiologist, MD;  Location: [place];  Service: Radiology;  Laterality: N/A;   SHOULDER SURGERY  2007/2008   SPINE SURGERY  2010   cervical   Patient Active Problem List   Diagnosis Date Noted   GAD (generalized anxiety disorder) 12/26/2017   OCD (obsessive compulsive disorder) 12/26/2017   PTSD (post-traumatic stress disorder) 12/26/2017   DDD (degenerative disc disease), cervical 07/14/2016  Primary osteoarthritis of both feet 07/14/2016   Primary osteoarthritis of both hands 07/14/2016   Other fatigue 07/14/2016   History of IBS 07/14/2016   Osteopenia of multiple sites 07/14/2016   Fibromyalgia 01/13/2016   MGUS (monoclonal gammopathy of unknown significance) 12/23/2013   Chronic cholecystitis without calculus 10/18/2012   Abdominal pain, unspecified site 10/18/2012   Nausea alone 10/18/2012   Unspecified constipation 10/18/2012   Depression 10/18/2012   Anxiety    ASCUS (atypical squamous cells of undetermined significance) on Pap smear    IUD    Hemorrhoids 11/29/2010   Abdominal pain, left upper quadrant 11/29/2010    PCP:  Mayra Neer, MD  REFERRING PROVIDER: Mayra Neer, MD  REFERRING DIAG: fibromyalgia  THERAPY DIAG:  Abnormal posture  Muscle weakness (generalized)  Cervicalgia  Rationale for  Evaluation and Treatment Rehabilitation  ONSET DATE: chronic pain (fibromyalgia) with flare-up 2 months ago  SUBJECTIVE:                                                                                                                                                                                                         SUBJECTIVE STATEMENT: I am not feeling well.  I have a post-viral cough and was up all night.  I am getting CT Scan on 12/22.  My neck is really feeling bad. I don't feel like I can exercise and I don't do DN on days I feel really bad b/c I don't like the needles.    PERTINENT HISTORY:  Anxiety, bipolar disorder, depression, fibromyalgia, IBS, migraines, osteopenia, PTSD  PAIN:  Are you having pain? Yes: NPRS scale: neck: 5/10 Pain location: Lt>Rt neck Pain description: sore, sharp, annoying Aggravating factors: activity, yard work, picking up heavy objects Relieving factors: muscle relaxers, rest, stretching  PRECAUTIONS: Other: chronic pain syndrome and depression Other: slow progression with exercise due to chronic condition  WEIGHT BEARING RESTRICTIONS No  FALLS:  Has patient fallen in last 6 months? No  LIVING ENVIRONMENT: Lives with: lives with their family Lives in: House/apartment  OCCUPATION: on disability  PLOF: Independent with basic ADLs and Leisure: yardwork, walking  Pt cares for her 2 young grandchildren- difficulty with care including lifting and carrying  PATIENT GOALS be more active with less pain, exercise at the gym regularly, lift and carry grandchildren and laundry, sleep with fewer interruptions, get back to more gardening.  OBJECTIVE:   DIAGNOSTIC FINDINGS: none recent   PATIENT SURVEYS: Eval: Fibromyalgia Impact Questionnaire: first 2 sections only: 49 12/26/21: 40   COGNITION: Overall cognitive status: Within functional limits  for tasks assessed  SENSATION: WFL  POSTURE: rounded shoulders and forward  head  PALPATION: Diffuse palpable tenderness over bil neck, upper traps, thoracic, lumbar and gluteals with trigger points.    CERVICAL ROM:   Active ROM A/PROM (deg) eval A/ROM 12/26/21  Flexion 55   Extension 40   Right lateral flexion 30 35  Left lateral flexion 35 40  Right rotation 50 60  Left rotation 40 65   (Blank rows = not tested)  UPPER EXTREMITY ROM: UE A/ROM is limited by 20% into flexion and abduction with pain at end range.  Hip flexibility is limited by 25% in all directions with pain in all directions.  UPPER EXTREMITY MMT: UE: 4+/5, LE 4+/5 except hip flexors 4-/5  TODAY'S TREATMENT:  02/06/22: Prone STM elongation to upper quadrants Supine gentle elongation and traction to lower cervical spine Trigger Point Dry-Needling  Treatment instructions: Expect mild to moderate muscle soreness. S/S of pneumothorax if dry needled over a lung field, and to seek immediate medical attention should they occur. Patient verbalized understanding of these instructions and education.  Patient Consent Given: Yes Education handout provided: Previously provided Muscles treated: Lt upper trap Electrical stimulation performed: No Parameters: N/A Treatment response/outcome: twitch and elongation Supine moist heat x10' end of session  02/01/22:Pt arrives for aquatic physical therapy. Treatment took place in 3.5-5.5 feet of water. Water temperature was 91 degrees f.. Pt entered the pool via stairs with mild use of the rails. Pt requires buoyancy of water for support and to offload joints with strengthening exercises.  Seated water bench with 75% submersion Pt performed seated LE AROM exercises 20x in all planes, concurrent discussion of current status. 75% depth water walking with UE buoy weights reciprocally 10x in each direction, high knee marching 20x, holding water bells by side in tandem stance: UE 10x saggital plane Bil. Hip 3 ways 15x each Bil trying not to hold on for balance  but requires intermittent UE dabs. Standing at wall core press with single buoy UE weight 2x10 followed by 10 min decompression float and bicycle.  Date: 01/30/22 NuStep: level 3x 6 minutes-PT present to discuss progress Open book x10 bil Childs pose and lateral childs pose x20 seconds each  Trigger Point Dry-Needling  Treatment instructions: Expect mild to moderate muscle soreness. S/S of pneumothorax if dry needled over a lung field, and to seek immediate medical attention should they occur. Patient verbalized understanding of these instructions and education.  Patient Consent Given: Yes Education handout provided: Previously provided Muscles treated: C4-7 multifidi, bil upper traps and levator Treatment response/outcome: Utilized skilled palpation to identify trigger points.  During dry needling able to palpate muscle twitch and muscle elongation  Elongation and mobilization of tissue after DN Skilled palpation and monitoring by PT during dry needling  Date: 01/16/22 Trigger Point Dry-Needling  Treatment instructions: Expect mild to moderate muscle soreness. S/S of pneumothorax if dry needled over a lung field, and to seek immediate medical attention should they occur. Patient verbalized understanding of these instructions and education.  Patient Consent Given: Yes Education handout provided: Previously provided Muscles treated: C4-7 multifidi, bil upper traps and levator Treatment response/outcome: Utilized skilled palpation to identify trigger points.  During dry needling able to palpate muscle twitch and muscle elongation  Elongation and mobilization of tissue after DN Skilled palpation and monitoring by PT during dry needling     PATIENT EDUCATION:  Education details: Access Code: [email or web address] (review from previous session). Discussed strategies to incorporate more movement  into her day considering her depression.  Pt will work to implement these strategies.   Person educated:  Patient Education method: Explanation, Demonstration, and Handouts Education comprehension: verbalized understanding and returned demonstration   HOME EXERCISE PROGRAM: Access Code: [email or web address]  ASSESSMENT:  CLINICAL IMPRESSION: Pt reports ongoing cough and little rest due to cough with post-viral symptoms.  She declined therex today due to exhaustion.  She initially declined DN but was then agreeable to Lt upper trap due to PT finding TP contributing to pain.  Pt benefits from manual techniques and heat for chronic neck pain. She gets a CT scan on 12/22.   OBJECTIVE IMPAIRMENTS decreased activity tolerance, decreased endurance, difficulty walking, decreased strength, increased muscle spasms, impaired flexibility, improper body mechanics, postural dysfunction, and pain.   ACTIVITY LIMITATIONS carrying, lifting, sitting, standing, reach over head, hygiene/grooming, and locomotion level  PARTICIPATION LIMITATIONS: meal prep, cleaning, laundry, driving, community activity, and yard work  PERSONAL FACTORS Past/current experiences, Time since onset of injury/illness/exacerbation, and 3+ comorbidities: fibromyalgia depression, anxiety,   are also affecting patient's functional outcome.   REHAB POTENTIAL: Good  CLINICAL DECISION MAKING: Evolving/moderate complexity  EVALUATION COMPLEXITY: Moderate   GOALS: Goals reviewed with patient? Yes  SHORT TERM GOALS: Target date: 01/23/22  Return to regular performance of HEP 2-3x/day to improve mobility  Baseline: stretching 1-2x/day (12/26/21) Goal status: In progress  2.  Report > or = to 25% reduction in cervical and lumbar pain with ADLs and self-care  Baseline: 10% better overall (12/26/21) Goal status:   3.  Reduce fibromyalgia impact score to < or = to 39  Baseline: 40 (12/26/21) Goal status: In progress     LONG TERM GOALS: Target date: 02/20/22  Be independent in advanced HEP Baseline: gentle exercise  Goal status: In  progress   2.  Reduce fibromyalgia impact questionnaire to < or = to 29 Baseline: 40 (12/26/21) Goal status: In progress   3.  Lift and carry laundry and her grandchildren with > or = to 50% reduction in pain Baseline: 10% better (12/26/21) Goal status: In progress   4.  Report > or = to 50% reduction in neck and lumbar pain with ADLs and self-care  Baseline: 10% better (12/26/21) Goal status: In progress   5.  Return to yardwork verbalize appropriate rest breaks and mechanics  Baseline: able to do some yard work (12/26/21) Goal status: In progress    PLAN: PT FREQUENCY: 2x/week  PT DURATION: 8 weeks  PLANNED INTERVENTIONS: Therapeutic exercises, Therapeutic activity, Neuromuscular re-education, Balance training, Gait training, Patient/Family education, Self Care, Joint mobilization, Aquatic Therapy, Dry Needling, Spinal manipulation, Spinal mobilization, Cryotherapy, Moist heat, Taping, Traction, Manual therapy, and Re-evaluation  PLAN FOR NEXT SESSION:  manual as needed, manage exercises as needed.    Baruch Merl, PT 02/06/22 11:46 AM    [facility] [address], [place], [place] [address] Phone # [phone] Fax [phone]

## 2022-02-07 ENCOUNTER — Ambulatory Visit: Payer: Medicare Other | Admitting: Psychology

## 2022-02-07 NOTE — Therapy (Deleted)
OUTPATIENT PHYSICAL THERAPY TREATMENT   Patient Name: Danielle Bruce MRN: [id number] DOB:[birth year], 61 y.o., female Today's Date: 02/07/2022                   Past Medical History:  Diagnosis Date   Anemia    Anxiety    Arthritis    osteoarthritis   ASCUS (atypical squamous cells of undetermined significance) on Pap smear 05/06/2005   NEG HIGH RISK HPV--C&B BIOPSY BENIGN 12/2005   Asthma    Bipolar 2 disorder (HCC)    Cancer ([place])    skin cancer - basal cell   Colon polyps    Complication of anesthesia    anxious afterwards, will get headaches    Constipation    Depression    Fibromyalgia 10/2013   GERD (gastroesophageal reflux disease)    Hearing loss on left    Heart murmur    never had any problems   Hemorrhoids    High cholesterol    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   cytology negative   IBS (irritable bowel syndrome)    Insomnia    Lymphocytic colitis    Lymphoma (HCC)    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   Bone marrow biopsy showes 8% plasma cells IgA Lambda   Migraines    Osteoarthritis    Osteopenia    Peripheral neuropathy    PTSD (post-traumatic stress disorder)    Past Surgical History:  Procedure Laterality Date   BONE MARROW BIOPSY Left 12/18/2013   Plasma cell dyscrasia 8% population of plasma cells   BREAST BIOPSY Right    benign stereo   CESAREAN SECTION  95,18,84   CHOLECYSTECTOMY N/A 10/16/2012   Procedure: LAPAROSCOPIC CHOLECYSTECTOMY;  Surgeon: Joyice Faster. Cornett, MD;  Location: [facility];  Service: General;  Laterality: N/A;   COLONOSCOPY     numerous times   DILATION AND CURETTAGE OF UTERUS     ESOPHAGOGASTRODUODENOSCOPY     HEMORRHOID SURGERY  1993   x3   IUD REMOVAL  02/2015   Mirena   LAPAROSCOPIC LYSIS OF ADHESIONS N/A 10/16/2012   Procedure: LAPAROSCOPIC LYSIS OF ADHESIONS;  Surgeon: Marcello Moores A. Cornett, MD;  Location: [facility];  Service: General;  Laterality: N/A;   LAPAROSCOPY N/A 10/16/2012    Procedure: LAPAROSCOPY DIAGNOSTIC;  Surgeon: Joyice Faster. Cornett, MD;  Location: [facility];  Service: General;  Laterality: N/A;   PELVIC LAPAROSCOPY     RADIOLOGY WITH ANESTHESIA N/A 12/16/2015   Procedure: MRI OF BRAIN WITH AND WITHOUT CONTRAST;  Surgeon: Medication Radiologist, MD;  Location: [facility];  Service: Radiology;  Laterality: N/A;   SHOULDER SURGERY  2007/2008   SPINE SURGERY  2010   cervical   Patient Active Problem List   Diagnosis Date Noted   GAD (generalized anxiety disorder) 12/26/2017   OCD (obsessive compulsive disorder) 12/26/2017   PTSD (post-traumatic stress disorder) 12/26/2017   DDD (degenerative disc disease), cervical 07/14/2016   Primary osteoarthritis of both feet 07/14/2016   Primary osteoarthritis of both hands 07/14/2016   Other fatigue 07/14/2016   History of IBS 07/14/2016   Osteopenia of multiple sites 07/14/2016   Fibromyalgia 01/13/2016   MGUS (monoclonal gammopathy of unknown significance) 12/23/2013   Chronic cholecystitis without calculus 10/18/2012   Abdominal pain, unspecified site 10/18/2012   Nausea alone 10/18/2012   Unspecified constipation 10/18/2012   Depression 10/18/2012   Anxiety    ASCUS (atypical squamous cells of undetermined significance) on Pap smear    IUD  Hemorrhoids 11/29/2010   Abdominal pain, left upper quadrant 11/29/2010    PCP:  Mayra Neer, MD  REFERRING PROVIDER: Mayra Neer, MD  REFERRING DIAG: fibromyalgia  THERAPY DIAG:  Abnormal posture  Muscle weakness (generalized)  Cervicalgia  Cramp and spasm  Rationale for Evaluation and Treatment Rehabilitation  ONSET DATE: chronic pain (fibromyalgia) with flare-up 2 months ago  SUBJECTIVE:                                                                                                                                                                                                         SUBJECTIVE STATEMENT: I am not feeling well.  I have a  post-viral cough and was up all night.  I am getting CT Scan on 12/22.  My neck is really feeling bad. I don't feel like I can exercise and I don't do DN on days I feel really bad b/c I don't like the needles.    PERTINENT HISTORY:  Anxiety, bipolar disorder, depression, fibromyalgia, IBS, migraines, osteopenia, PTSD  PAIN:  Are you having pain? Yes: NPRS scale: neck: 5/10 Pain location: Lt>Rt neck Pain description: sore, sharp, annoying Aggravating factors: activity, yard work, picking up heavy objects Relieving factors: muscle relaxers, rest, stretching  PRECAUTIONS: Other: chronic pain syndrome and depression Other: slow progression with exercise due to chronic condition  WEIGHT BEARING RESTRICTIONS No  FALLS:  Has patient fallen in last 6 months? No  LIVING ENVIRONMENT: Lives with: lives with their family Lives in: House/apartment  OCCUPATION: on disability  PLOF: Independent with basic ADLs and Leisure: yardwork, walking  Pt cares for her 66 young grandchildren- difficulty with care including lifting and carrying  PATIENT GOALS be more active with less pain, exercise at the gym regularly, lift and carry grandchildren and laundry, sleep with fewer interruptions, get back to more gardening.  OBJECTIVE:   DIAGNOSTIC FINDINGS: none recent   PATIENT SURVEYS: Eval: Fibromyalgia Impact Questionnaire: first 2 sections only: 49 12/26/21: 40   COGNITION: Overall cognitive status: Within functional limits for tasks assessed  SENSATION: WFL  POSTURE: rounded shoulders and forward head  PALPATION: Diffuse palpable tenderness over bil neck, upper traps, thoracic, lumbar and gluteals with trigger points.    CERVICAL ROM:   Active ROM A/PROM (deg) eval A/ROM 12/26/21  Flexion 55   Extension 40   Right lateral flexion 30 35  Left lateral flexion 35 40  Right rotation 50 60  Left rotation 40 65   (Blank rows = not tested)  UPPER EXTREMITY ROM: UE A/ROM is limited by  20%  into flexion and abduction with pain at end range.  Hip flexibility is limited by 25% in all directions with pain in all directions.  UPPER EXTREMITY MMT: UE: 4+/5, LE 4+/5 except hip flexors 4-/5  TODAY'S TREATMENT:  02/06/22: Prone STM elongation to upper quadrants Supine gentle elongation and traction to lower cervical spine Trigger Point Dry-Needling  Treatment instructions: Expect mild to moderate muscle soreness. S/S of pneumothorax if dry needled over a lung field, and to seek immediate medical attention should they occur. Patient verbalized understanding of these instructions and education.  Patient Consent Given: Yes Education handout provided: Previously provided Muscles treated: Lt upper trap Electrical stimulation performed: No Parameters: N/A Treatment response/outcome: twitch and elongation Supine moist heat x10' end of session  02/01/22:Pt arrives for aquatic physical therapy. Treatment took place in 3.5-5.5 feet of water. Water temperature was 91 degrees f.. Pt entered the pool via stairs with mild use of the rails. Pt requires buoyancy of water for support and to offload joints with strengthening exercises.  Seated water bench with 75% submersion Pt performed seated LE AROM exercises 20x in all planes, concurrent discussion of current status. 75% depth water walking with UE buoy weights reciprocally 10x in each direction, high knee marching 20x, holding water bells by side in tandem stance: UE 10x saggital plane Bil. Hip 3 ways 15x each Bil trying not to hold on for balance but requires intermittent UE dabs. Standing at wall core press with single buoy UE weight 2x10 followed by 10 min decompression float and bicycle.  Date: 01/30/22 NuStep: level 3x 6 minutes-PT present to discuss progress Open book x10 bil Childs pose and lateral childs pose x20 seconds each  Trigger Point Dry-Needling  Treatment instructions: Expect mild to moderate muscle soreness. S/S of  pneumothorax if dry needled over a lung field, and to seek immediate medical attention should they occur. Patient verbalized understanding of these instructions and education.  Patient Consent Given: Yes Education handout provided: Previously provided Muscles treated: C4-7 multifidi, bil upper traps and levator Treatment response/outcome: Utilized skilled palpation to identify trigger points.  During dry needling able to palpate muscle twitch and muscle elongation  Elongation and mobilization of tissue after DN Skilled palpation and monitoring by PT during dry needling  Date: 01/16/22 Trigger Point Dry-Needling  Treatment instructions: Expect mild to moderate muscle soreness. S/S of pneumothorax if dry needled over a lung field, and to seek immediate medical attention should they occur. Patient verbalized understanding of these instructions and education.  Patient Consent Given: Yes Education handout provided: Previously provided Muscles treated: C4-7 multifidi, bil upper traps and levator Treatment response/outcome: Utilized skilled palpation to identify trigger points.  During dry needling able to palpate muscle twitch and muscle elongation  Elongation and mobilization of tissue after DN Skilled palpation and monitoring by PT during dry needling     PATIENT EDUCATION:  Education details: Access Code: [email or web address] (review from previous session). Discussed strategies to incorporate more movement into her day considering her depression.  Pt will work to implement these strategies.   Person educated: Patient Education method: Explanation, Demonstration, and Handouts Education comprehension: verbalized understanding and returned demonstration   HOME EXERCISE PROGRAM: Access Code: [email or web address]  ASSESSMENT:  CLINICAL IMPRESSION: Pt reports ongoing cough and little rest due to cough with post-viral symptoms.  She declined therex today due to exhaustion.  She initially declined DN but was then  agreeable to Lt upper trap due to PT finding TP contributing to pain.  Pt benefits from  manual techniques and heat for chronic neck pain. She gets a CT scan on 12/22.   OBJECTIVE IMPAIRMENTS decreased activity tolerance, decreased endurance, difficulty walking, decreased strength, increased muscle spasms, impaired flexibility, improper body mechanics, postural dysfunction, and pain.   ACTIVITY LIMITATIONS carrying, lifting, sitting, standing, reach over head, hygiene/grooming, and locomotion level  PARTICIPATION LIMITATIONS: meal prep, cleaning, laundry, driving, community activity, and yard work  PERSONAL FACTORS Past/current experiences, Time since onset of injury/illness/exacerbation, and 3+ comorbidities: fibromyalgia depression, anxiety,   are also affecting patient's functional outcome.   REHAB POTENTIAL: Good  CLINICAL DECISION MAKING: Evolving/moderate complexity  EVALUATION COMPLEXITY: Moderate   GOALS: Goals reviewed with patient? Yes  SHORT TERM GOALS: Target date: 01/23/22  Return to regular performance of HEP 2-3x/day to improve mobility  Baseline: stretching 1-2x/day (12/26/21) Goal status: In progress  2.  Report > or = to 25% reduction in cervical and lumbar pain with ADLs and self-care  Baseline: 10% better overall (12/26/21) Goal status:   3.  Reduce fibromyalgia impact score to < or = to 39  Baseline: 40 (12/26/21) Goal status: In progress     LONG TERM GOALS: Target date: 02/20/22  Be independent in advanced HEP Baseline: gentle exercise  Goal status: In progress   2.  Reduce fibromyalgia impact questionnaire to < or = to 29 Baseline: 40 (12/26/21) Goal status: In progress   3.  Lift and carry laundry and her grandchildren with > or = to 50% reduction in pain Baseline: 10% better (12/26/21) Goal status: In progress   4.  Report > or = to 50% reduction in neck and lumbar pain with ADLs and self-care  Baseline: 10% better (12/26/21) Goal status:  In progress   5.  Return to yardwork verbalize appropriate rest breaks and mechanics  Baseline: able to do some yard work (12/26/21) Goal status: In progress    PLAN: PT FREQUENCY: 2x/week  PT DURATION: 8 weeks  PLANNED INTERVENTIONS: Therapeutic exercises, Therapeutic activity, Neuromuscular re-education, Balance training, Gait training, Patient/Family education, Self Care, Joint mobilization, Aquatic Therapy, Dry Needling, Spinal manipulation, Spinal mobilization, Cryotherapy, Moist heat, Taping, Traction, Manual therapy, and Re-evaluation  PLAN FOR NEXT SESSION:  manual as needed, manage exercises as needed.    Baruch Merl, PT 02/07/22 9:20 PM    [facility] [address], [place] [address] [place], [place] [address] Phone # [phone] Fax [phone]

## 2022-02-08 ENCOUNTER — Ambulatory Visit: Payer: Medicare Other | Admitting: Physical Therapy

## 2022-02-08 DIAGNOSIS — R293 Abnormal posture: Secondary | ICD-10-CM

## 2022-02-08 DIAGNOSIS — M542 Cervicalgia: Secondary | ICD-10-CM

## 2022-02-08 DIAGNOSIS — R252 Cramp and spasm: Secondary | ICD-10-CM

## 2022-02-08 DIAGNOSIS — M6281 Muscle weakness (generalized): Secondary | ICD-10-CM

## 2022-02-13 ENCOUNTER — Ambulatory Visit: Payer: Medicare Other

## 2022-02-13 DIAGNOSIS — M353 Polymyalgia rheumatica: Secondary | ICD-10-CM

## 2022-02-13 DIAGNOSIS — M6281 Muscle weakness (generalized): Secondary | ICD-10-CM

## 2022-02-13 DIAGNOSIS — R293 Abnormal posture: Secondary | ICD-10-CM

## 2022-02-13 DIAGNOSIS — R252 Cramp and spasm: Secondary | ICD-10-CM

## 2022-02-13 DIAGNOSIS — M542 Cervicalgia: Secondary | ICD-10-CM

## 2022-02-13 NOTE — Therapy (Signed)
OUTPATIENT PHYSICAL THERAPY TREATMENT   Patient Name: Danielle Bruce MRN: [id number] DOB:11-11-1960, 61 y.o., female Today's Date: 02/13/2022      PT End of Session - 02/13/22 1134     Visit Number 16    Date for PT Re-Evaluation 02/20/22    Authorization Type Medicare B- needs KX modifier now    Progress Note Due on Visit 20    PT Start Time 1102    PT Stop Time 1135    PT Time Calculation (min) 33 min    Activity Tolerance Patient tolerated treatment well    Behavior During Therapy [facility] for tasks assessed/performed                         Past Medical History:  Diagnosis Date   Anemia    Anxiety    Arthritis    osteoarthritis   ASCUS (atypical squamous cells of undetermined significance) on Pap smear 05/06/2005   NEG HIGH RISK HPV--C&B BIOPSY BENIGN 12/2005   Asthma    Bipolar 2 disorder ([place])    Cancer ([place])    skin cancer - basal cell   Colon polyps    Complication of anesthesia    anxious afterwards, will get headaches    Constipation    Depression    Fibromyalgia 10/2013   GERD (gastroesophageal reflux disease)    Hearing loss on left    Heart murmur    never had any problems   Hemorrhoids    High cholesterol    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   cytology negative   IBS (irritable bowel syndrome)    Insomnia    Lymphocytic colitis    Lymphoma ([facility])    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   Bone marrow biopsy showes 8% plasma cells IgA Lambda   Migraines    Osteoarthritis    Osteopenia    Peripheral neuropathy    PTSD (post-traumatic stress disorder)    Past Surgical History:  Procedure Laterality Date   BONE MARROW BIOPSY Left 12/18/2013   Plasma cell dyscrasia 8% population of plasma cells   BREAST BIOPSY Right    benign stereo   CESAREAN SECTION  40,98,11   CHOLECYSTECTOMY N/A 10/16/2012   Procedure: LAPAROSCOPIC CHOLECYSTECTOMY;  Surgeon: Joyice Faster. Cornett, MD;  Location: [facility];  Service: General;   Laterality: N/A;   COLONOSCOPY     numerous times   DILATION AND CURETTAGE OF UTERUS     ESOPHAGOGASTRODUODENOSCOPY     HEMORRHOID SURGERY  1993   x3   IUD REMOVAL  02/2015   Mirena   LAPAROSCOPIC LYSIS OF ADHESIONS N/A 10/16/2012   Procedure: LAPAROSCOPIC LYSIS OF ADHESIONS;  Surgeon: Marcello Moores A. Cornett, MD;  Location: [facility];  Service: General;  Laterality: N/A;   LAPAROSCOPY N/A 10/16/2012   Procedure: LAPAROSCOPY DIAGNOSTIC;  Surgeon: Joyice Faster. Cornett, MD;  Location: [facility];  Service: General;  Laterality: N/A;   PELVIC LAPAROSCOPY     RADIOLOGY WITH ANESTHESIA N/A 12/16/2015   Procedure: MRI OF BRAIN WITH AND WITHOUT CONTRAST;  Surgeon: Medication Radiologist, MD;  Location: [facility];  Service: Radiology;  Laterality: N/A;   SHOULDER SURGERY  2007/2008   SPINE SURGERY  2010   cervical   Patient Active Problem List   Diagnosis Date Noted   GAD (generalized anxiety disorder) 12/26/2017   OCD (obsessive compulsive disorder) 12/26/2017   PTSD (post-traumatic stress disorder) 12/26/2017   DDD (degenerative disc disease), cervical 07/14/2016  Primary osteoarthritis of both feet 07/14/2016   Primary osteoarthritis of both hands 07/14/2016   Other fatigue 07/14/2016   History of IBS 07/14/2016   Osteopenia of multiple sites 07/14/2016   Fibromyalgia 01/13/2016   MGUS (monoclonal gammopathy of unknown significance) 12/23/2013   Chronic cholecystitis without calculus 10/18/2012   Abdominal pain, unspecified site 10/18/2012   Nausea alone 10/18/2012   Unspecified constipation 10/18/2012   Depression 10/18/2012   Anxiety    ASCUS (atypical squamous cells of undetermined significance) on Pap smear    IUD    Hemorrhoids 11/29/2010   Abdominal pain, left upper quadrant 11/29/2010    PCP:  Mayra Neer, MD  REFERRING PROVIDER: Mayra Neer, MD  REFERRING DIAG: fibromyalgia  THERAPY DIAG:  Abnormal posture  Cervicalgia  Muscle weakness (generalized)  Cramp and  spasm  Polymyalgia rheumatica ([place])  Rationale for Evaluation and Treatment Rehabilitation  ONSET DATE: chronic pain (fibromyalgia) with flare-up 2 months ago  SUBJECTIVE:                                                                                                                                                                                                         SUBJECTIVE STATEMENT: I felt better after the DN last session.  I am feeling better overall.  I am tweaking my neck occasionally due to helping with my dad at home.  I am working on my upper body strength.    PERTINENT HISTORY:  Anxiety, bipolar disorder, depression, fibromyalgia, IBS, migraines, osteopenia, PTSD  PAIN:  Are you having pain? Yes: NPRS scale: neck: 4-5/10 Pain location: Lt>Rt neck Pain description: sore, sharp, annoying Aggravating factors: activity, yard work, picking up heavy objects Relieving factors: muscle relaxers, rest, stretching  PRECAUTIONS: Other: chronic pain syndrome and depression Other: slow progression with exercise due to chronic condition  WEIGHT BEARING RESTRICTIONS No  FALLS:  Has patient fallen in last 6 months? No  LIVING ENVIRONMENT: Lives with: lives with their family Lives in: House/apartment  OCCUPATION: on disability  PLOF: Independent with basic ADLs and Leisure: yardwork, walking  Pt cares for her 9 young grandchildren- difficulty with care including lifting and carrying  PATIENT GOALS be more active with less pain, exercise at the gym regularly, lift and carry grandchildren and laundry, sleep with fewer interruptions, get back to more gardening.  OBJECTIVE:   DIAGNOSTIC FINDINGS: none recent   PATIENT SURVEYS: Eval: Fibromyalgia Impact Questionnaire: first 2 sections only: 49 12/26/21: 40   COGNITION: Overall cognitive status: Within functional limits for tasks assessed  SENSATION: WFL  POSTURE: rounded  shoulders and forward  head  PALPATION: Diffuse palpable tenderness over bil neck, upper traps, thoracic, lumbar and gluteals with trigger points.    CERVICAL ROM:   Active ROM A/PROM (deg) eval A/ROM 12/26/21  Flexion 55   Extension 40   Right lateral flexion 30 35  Left lateral flexion 35 40  Right rotation 50 60  Left rotation 40 65   (Blank rows = not tested)  UPPER EXTREMITY ROM: UE A/ROM is limited by 20% into flexion and abduction with pain at end range.  Hip flexibility is limited by 25% in all directions with pain in all directions.  UPPER EXTREMITY MMT: UE: 4+/5, LE 4+/5 except hip flexors 4-/5  TODAY'S TREATMENT:  02/13/22: Prone STM elongation to upper quadrants Supine gentle elongation and traction to lower cervical spine Trigger Point Dry-Needling  Treatment instructions: Expect mild to moderate muscle soreness. S/S of pneumothorax if dry needled over a lung field, and to seek immediate medical attention should they occur. Patient verbalized understanding of these instructions and education.  Patient Consent Given: Yes Education handout provided: Previously provided Muscles treated: Lt upper trap, Lt cervical and thoracic multifidi, levator Electrical stimulation performed: No Parameters: N/A Treatment response/outcome: twitch and elongation Supine moist heat x10' end of session  02/06/22: Prone STM elongation to upper quadrants Supine gentle elongation and traction to lower cervical spine Trigger Point Dry-Needling  Treatment instructions: Expect mild to moderate muscle soreness. S/S of pneumothorax if dry needled over a lung field, and to seek immediate medical attention should they occur. Patient verbalized understanding of these instructions and education.  Patient Consent Given: Yes Education handout provided: Previously provided Muscles treated: Lt upper trap Electrical stimulation performed: No Parameters: N/A Treatment response/outcome: twitch and elongation Supine  moist heat x10' end of session  02/01/22:Pt arrives for aquatic physical therapy. Treatment took place in 3.5-5.5 feet of water. Water temperature was 91 degrees f.. Pt entered the pool via stairs with mild use of the rails. Pt requires buoyancy of water for support and to offload joints with strengthening exercises.  Seated water bench with 75% submersion Pt performed seated LE AROM exercises 20x in all planes, concurrent discussion of current status. 75% depth water walking with UE buoy weights reciprocally 10x in each direction, high knee marching 20x, holding water bells by side in tandem stance: UE 10x saggital plane Bil. Hip 3 ways 15x each Bil trying not to hold on for balance but requires intermittent UE dabs. Standing at wall core press with single buoy UE weight 2x10 followed by 10 min decompression float and bicycle.   PATIENT EDUCATION:  Education details: Access Code: [email or web address] (review from previous session). Discussed strategies to incorporate more movement into her day considering her depression.  Pt will work to implement these strategies.   Person educated: Patient Education method: Explanation, Demonstration, and Handouts Education comprehension: verbalized understanding and returned demonstration   HOME EXERCISE PROGRAM: Access Code: [email or web address]  ASSESSMENT:  CLINICAL IMPRESSION: Pt is performing light upper body weight training and flexibility exercises regularly at home.  Overall, she is feeling better.  She is helping to care for her elderly dad at home and reports that she strained her Lt neck 2 days ago.  Pt benefits from manual techniques and heat for chronic neck pain.  Session focused on DN and manual work to address Lt>Rt neck and thoracic tension. Good twitch response and improved tissue mobility after manual therapy. She gets a CT scan on 12/22.  Pt will continue to  benefit from manual therapy and gentle exercise including aquatics to manage chronic pain.     OBJECTIVE  IMPAIRMENTS decreased activity tolerance, decreased endurance, difficulty walking, decreased strength, increased muscle spasms, impaired flexibility, improper body mechanics, postural dysfunction, and pain.   ACTIVITY LIMITATIONS carrying, lifting, sitting, standing, reach over head, hygiene/grooming, and locomotion level  PARTICIPATION LIMITATIONS: meal prep, cleaning, laundry, driving, community activity, and yard work  PERSONAL FACTORS Past/current experiences, Time since onset of injury/illness/exacerbation, and 3+ comorbidities: fibromyalgia depression, anxiety,   are also affecting patient's functional outcome.   REHAB POTENTIAL: Good  CLINICAL DECISION MAKING: Evolving/moderate complexity  EVALUATION COMPLEXITY: Moderate   GOALS: Goals reviewed with patient? Yes  SHORT TERM GOALS: Target date: 01/23/22  Return to regular performance of HEP 2-3x/day to improve mobility  Baseline: stretching 1-2x/day (12/26/21) Goal status: In progress  2.  Report > or = to 25% reduction in cervical and lumbar pain with ADLs and self-care  Baseline: 10% better overall (12/26/21) Goal status:   3.  Reduce fibromyalgia impact score to < or = to 39  Baseline: 40 (12/26/21) Goal status: In progress     LONG TERM GOALS: Target date: 02/20/22  Be independent in advanced HEP Baseline: gentle exercise  Goal status: In progress   2.  Reduce fibromyalgia impact questionnaire to < or = to 29 Baseline: 40 (12/26/21) Goal status: In progress   3.  Lift and carry laundry and her grandchildren with > or = to 50% reduction in pain Baseline: 10% better (12/26/21) Goal status: In progress   4.  Report > or = to 50% reduction in neck and lumbar pain with ADLs and self-care  Baseline: 10% better (12/26/21) Goal status: In progress   5.  Return to yardwork verbalize appropriate rest breaks and mechanics  Baseline: able to do some yard work (12/26/21) Goal status: In progress    PLAN: PT  FREQUENCY: 2x/week  PT DURATION: 8 weeks  PLANNED INTERVENTIONS: Therapeutic exercises, Therapeutic activity, Neuromuscular re-education, Balance training, Gait training, Patient/Family education, Self Care, Joint mobilization, Aquatic Therapy, Dry Needling, Spinal manipulation, Spinal mobilization, Cryotherapy, Moist heat, Taping, Traction, Manual therapy, and Re-evaluation  PLAN FOR NEXT SESSION:  manual as needed, manage exercises as needed.  ERO next week by PT when treated on land.   Sigurd Sos, PT 02/13/22 11:41 AM     [facility] [address], [place], [place] [address] Phone # [phone] Fax [phone]

## 2022-02-15 ENCOUNTER — Encounter: Payer: Self-pay | Admitting: Physical Therapy

## 2022-02-15 ENCOUNTER — Ambulatory Visit: Payer: Medicare Other | Admitting: Physical Therapy

## 2022-02-15 DIAGNOSIS — J3081 Allergic rhinitis due to animal (cat) (dog) hair and dander: Secondary | ICD-10-CM | POA: Diagnosis not present

## 2022-02-15 DIAGNOSIS — R252 Cramp and spasm: Secondary | ICD-10-CM | POA: Diagnosis not present

## 2022-02-15 DIAGNOSIS — M542 Cervicalgia: Secondary | ICD-10-CM

## 2022-02-15 DIAGNOSIS — J3089 Other allergic rhinitis: Secondary | ICD-10-CM | POA: Diagnosis not present

## 2022-02-15 DIAGNOSIS — M6281 Muscle weakness (generalized): Secondary | ICD-10-CM

## 2022-02-15 DIAGNOSIS — H1045 Other chronic allergic conjunctivitis: Secondary | ICD-10-CM | POA: Diagnosis not present

## 2022-02-15 DIAGNOSIS — J4530 Mild persistent asthma, uncomplicated: Secondary | ICD-10-CM | POA: Diagnosis not present

## 2022-02-15 DIAGNOSIS — J301 Allergic rhinitis due to pollen: Secondary | ICD-10-CM | POA: Diagnosis not present

## 2022-02-15 DIAGNOSIS — R293 Abnormal posture: Secondary | ICD-10-CM | POA: Diagnosis not present

## 2022-02-15 DIAGNOSIS — M353 Polymyalgia rheumatica: Secondary | ICD-10-CM | POA: Diagnosis not present

## 2022-02-15 NOTE — Therapy (Signed)
OUTPATIENT PHYSICAL THERAPY TREATMENT   Patient Name: Danielle Bruce MRN: [id number] DOB:Aug 31, 1960, 61 y.o., female Today's Date: 02/15/2022      PT End of Session - 02/15/22 1220     Visit Number 17    Date for PT Re-Evaluation 02/20/22    Authorization Type Medicare B- needs KX modifier now    Progress Note Due on Visit 20    PT Start Time 1100    PT Stop Time 1145    PT Time Calculation (min) 45 min    Activity Tolerance Patient tolerated treatment well    Behavior During Therapy [facility] for tasks assessed/performed                         Past Medical History:  Diagnosis Date   Anemia    Anxiety    Arthritis    osteoarthritis   ASCUS (atypical squamous cells of undetermined significance) on Pap smear 05/06/2005   NEG HIGH RISK HPV--C&B BIOPSY BENIGN 12/2005   Asthma    Bipolar 2 disorder ([place])    Cancer ([place])    skin cancer - basal cell   Colon polyps    Complication of anesthesia    anxious afterwards, will get headaches    Constipation    Depression    Fibromyalgia 10/2013   GERD (gastroesophageal reflux disease)    Hearing loss on left    Heart murmur    never had any problems   Hemorrhoids    High cholesterol    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   cytology negative   IBS (irritable bowel syndrome)    Insomnia    Lymphocytic colitis    Lymphoma ([facility])    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   Bone marrow biopsy showes 8% plasma cells IgA Lambda   Migraines    Osteoarthritis    Osteopenia    Peripheral neuropathy    PTSD (post-traumatic stress disorder)    Past Surgical History:  Procedure Laterality Date   BONE MARROW BIOPSY Left 12/18/2013   Plasma cell dyscrasia 8% population of plasma cells   BREAST BIOPSY Right    benign stereo   CESAREAN SECTION  65,99,35   CHOLECYSTECTOMY N/A 10/16/2012   Procedure: LAPAROSCOPIC CHOLECYSTECTOMY;  Surgeon: Joyice Faster. Cornett, MD;  Location: [facility];  Service: General;   Laterality: N/A;   COLONOSCOPY     numerous times   DILATION AND CURETTAGE OF UTERUS     ESOPHAGOGASTRODUODENOSCOPY     HEMORRHOID SURGERY  1993   x3   IUD REMOVAL  02/2015   Mirena   LAPAROSCOPIC LYSIS OF ADHESIONS N/A 10/16/2012   Procedure: LAPAROSCOPIC LYSIS OF ADHESIONS;  Surgeon: Marcello Moores A. Cornett, MD;  Location: [facility];  Service: General;  Laterality: N/A;   LAPAROSCOPY N/A 10/16/2012   Procedure: LAPAROSCOPY DIAGNOSTIC;  Surgeon: Joyice Faster. Cornett, MD;  Location: [facility];  Service: General;  Laterality: N/A;   PELVIC LAPAROSCOPY     RADIOLOGY WITH ANESTHESIA N/A 12/16/2015   Procedure: MRI OF BRAIN WITH AND WITHOUT CONTRAST;  Surgeon: Medication Radiologist, MD;  Location: [place];  Service: Radiology;  Laterality: N/A;   SHOULDER SURGERY  2007/2008   SPINE SURGERY  2010   cervical   Patient Active Problem List   Diagnosis Date Noted   GAD (generalized anxiety disorder) 12/26/2017   OCD (obsessive compulsive disorder) 12/26/2017   PTSD (post-traumatic stress disorder) 12/26/2017   DDD (degenerative disc disease), cervical 07/14/2016  Primary osteoarthritis of both feet 07/14/2016   Primary osteoarthritis of both hands 07/14/2016   Other fatigue 07/14/2016   History of IBS 07/14/2016   Osteopenia of multiple sites 07/14/2016   Fibromyalgia 01/13/2016   MGUS (monoclonal gammopathy of unknown significance) 12/23/2013   Chronic cholecystitis without calculus 10/18/2012   Abdominal pain, unspecified site 10/18/2012   Nausea alone 10/18/2012   Unspecified constipation 10/18/2012   Depression 10/18/2012   Anxiety    ASCUS (atypical squamous cells of undetermined significance) on Pap smear    IUD    Hemorrhoids 11/29/2010   Abdominal pain, left upper quadrant 11/29/2010    PCP:  Mayra Neer, MD  REFERRING PROVIDER: Mayra Neer, MD  REFERRING DIAG: fibromyalgia  THERAPY DIAG:  Abnormal posture  Cervicalgia  Muscle weakness (generalized)  Cramp and  spasm  Rationale for Evaluation and Treatment Rehabilitation  ONSET DATE: chronic pain (fibromyalgia) with flare-up 2 months ago  SUBJECTIVE:                                                                                                                                                                                                         SUBJECTIVE STATEMENT: I don't feel too bad today. Excited to be back in the pool.   PERTINENT HISTORY:  Anxiety, bipolar disorder, depression, fibromyalgia, IBS, migraines, osteopenia, PTSD  PAIN:  Are you having pain? Yes: NPRS scale: neck: 4-5/10 Pain location: Lt>Rt neck Pain description: sore, sharp, annoying Aggravating factors: activity, yard work, picking up heavy objects Relieving factors: muscle relaxers, rest, stretching  PRECAUTIONS: Other: chronic pain syndrome and depression Other: slow progression with exercise due to chronic condition  WEIGHT BEARING RESTRICTIONS No  FALLS:  Has patient fallen in last 6 months? No  LIVING ENVIRONMENT: Lives with: lives with their family Lives in: House/apartment  OCCUPATION: on disability  PLOF: Independent with basic ADLs and Leisure: yardwork, walking  Pt cares for her 38 young grandchildren- difficulty with care including lifting and carrying  PATIENT GOALS be more active with less pain, exercise at the gym regularly, lift and carry grandchildren and laundry, sleep with fewer interruptions, get back to more gardening.  OBJECTIVE:   DIAGNOSTIC FINDINGS: none recent   PATIENT SURVEYS: Eval: Fibromyalgia Impact Questionnaire: first 2 sections only: 49 12/26/21: 40   COGNITION: Overall cognitive status: Within functional limits for tasks assessed  SENSATION: WFL  POSTURE: rounded shoulders and forward head  PALPATION: Diffuse palpable tenderness over bil neck, upper traps, thoracic, lumbar and gluteals with trigger points.    CERVICAL ROM:   Active ROM  A/PROM (deg) eval  A/ROM 12/26/21  Flexion 55   Extension 40   Right lateral flexion 30 35  Left lateral flexion 35 40  Right rotation 50 60  Left rotation 40 65   (Blank rows = not tested)  UPPER EXTREMITY ROM: UE A/ROM is limited by 20% into flexion and abduction with pain at end range.  Hip flexibility is limited by 25% in all directions with pain in all directions.  UPPER EXTREMITY MMT: UE: 4+/5, LE 4+/5 except hip flexors 4-/5  TODAY'S TREATMENT:   02/15/22:Pt arrives for aquatic physical therapy. Treatment took place in 3.5-5.5 feet of water. Water temperature was 91 degrees f.. Pt entered the pool via stairs with mild use of the rails. Pt requires buoyancy of water for support and to offload joints with strengthening exercises.  Seated water bench with 75% submersion Pt performed seated LE AROM exercises 20x in all planes, concurrent discussion of current status. 75% depth water walking with UE buoy weights reciprocally 10x in each direction, high knee marching 20x, holding water bells by side in tandem stance: UE 10x saggital plane Bil. Hip 3 ways 15x each Bil trying not to hold on for balance but requires intermittent UE dabs. Standing at wall core press with single buoy UE weight 2x10 followed by 10 min decompression float and bicycle. 02/13/22: Prone STM elongation to upper quadrants Supine gentle elongation and traction to lower cervical spine Trigger Point Dry-Needling  Treatment instructions: Expect mild to moderate muscle soreness. S/S of pneumothorax if dry needled over a lung field, and to seek immediate medical attention should they occur. Patient verbalized understanding of these instructions and education.  Patient Consent Given: Yes Education handout provided: Previously provided Muscles treated: Lt upper trap, Lt cervical and thoracic multifidi, levator Electrical stimulation performed: No Parameters: N/A Treatment response/outcome: twitch and elongation Supine moist heat x10'  end of session  02/06/22: Prone STM elongation to upper quadrants Supine gentle elongation and traction to lower cervical spine Trigger Point Dry-Needling  Treatment instructions: Expect mild to moderate muscle soreness. S/S of pneumothorax if dry needled over a lung field, and to seek immediate medical attention should they occur. Patient verbalized understanding of these instructions and education.  Patient Consent Given: Yes Education handout provided: Previously provided Muscles treated: Lt upper trap Electrical stimulation performed: No Parameters: N/A Treatment response/outcome: twitch and elongation Supine moist heat x10' end of session  02/01/22:Pt arrives for aquatic physical therapy. Treatment took place in 3.5-5.5 feet of water. Water temperature was 91 degrees f.. Pt entered the pool via stairs with mild use of the rails. Pt requires buoyancy of water for support and to offload joints with strengthening exercises.  Seated water bench with 75% submersion Pt performed seated LE AROM exercises 20x in all planes, concurrent discussion of current status. 75% depth water walking with UE buoy weights reciprocally 10x in each direction, high knee marching 20x, holding water bells by side in tandem stance: UE 10x saggital plane Bil. Hip 3 ways 15x each Bil trying not to hold on for balance but requires intermittent UE dabs. Standing at wall core press with single buoy UE weight 2x10 followed by 10 min decompression float and bicycle.   PATIENT EDUCATION:  Education details: Access Code: [email or web address] (review from previous session). Discussed strategies to incorporate more movement into her day considering her depression.  Pt will work to implement these strategies.   Person educated: Patient Education method: Explanation, Demonstration, and Handouts Education comprehension: verbalized understanding and returned  demonstration   HOME EXERCISE PROGRAM: Access Code:  [email or web address]  ASSESSMENT:  CLINICAL IMPRESSION: Pt did very well with pt's return to aquatic PT following a bout with an illness. Pt had good endurance, really never taking a rest. There was no complaints of pain and pt felt good after her session was over.  OBJECTIVE IMPAIRMENTS decreased activity tolerance, decreased endurance, difficulty walking, decreased strength, increased muscle spasms, impaired flexibility, improper body mechanics, postural dysfunction, and pain.   ACTIVITY LIMITATIONS carrying, lifting, sitting, standing, reach over head, hygiene/grooming, and locomotion level  PARTICIPATION LIMITATIONS: meal prep, cleaning, laundry, driving, community activity, and yard work  PERSONAL FACTORS Past/current experiences, Time since onset of injury/illness/exacerbation, and 3+ comorbidities: fibromyalgia depression, anxiety,   are also affecting patient's functional outcome.   REHAB POTENTIAL: Good  CLINICAL DECISION MAKING: Evolving/moderate complexity  EVALUATION COMPLEXITY: Moderate   GOALS: Goals reviewed with patient? Yes  SHORT TERM GOALS: Target date: 01/23/22  Return to regular performance of HEP 2-3x/day to improve mobility  Baseline: stretching 1-2x/day (12/26/21) Goal status: In progress  2.  Report > or = to 25% reduction in cervical and lumbar pain with ADLs and self-care  Baseline: 10% better overall (12/26/21) Goal status:   3.  Reduce fibromyalgia impact score to < or = to 39  Baseline: 40 (12/26/21) Goal status: In progress     LONG TERM GOALS: Target date: 02/20/22  Be independent in advanced HEP Baseline: gentle exercise  Goal status: In progress   2.  Reduce fibromyalgia impact questionnaire to < or = to 29 Baseline: 40 (12/26/21) Goal status: In progress   3.  Lift and carry laundry and her grandchildren with > or = to 50% reduction in pain Baseline: 10% better (12/26/21) Goal status: In progress   4.  Report > or = to 50% reduction in  neck and lumbar pain with ADLs and self-care  Baseline: 10% better (12/26/21) Goal status: In progress   5.  Return to yardwork verbalize appropriate rest breaks and mechanics  Baseline: able to do some yard work (12/26/21) Goal status: In progress    PLAN: PT FREQUENCY: 2x/week  PT DURATION: 8 weeks  PLANNED INTERVENTIONS: Therapeutic exercises, Therapeutic activity, Neuromuscular re-education, Balance training, Gait training, Patient/Family education, Self Care, Joint mobilization, Aquatic Therapy, Dry Needling, Spinal manipulation, Spinal mobilization, Cryotherapy, Moist heat, Taping, Traction, Manual therapy, and Re-evaluation  PLAN FOR NEXT SESSION:  manual as needed, manage exercises as needed.  ERO next week by PT when treated on land.   Myrene Galas, PTA 02/15/22 12:20 PM   [facility] [address] [place] [address] [place], [place] [address] Phone # [phone] Fax [phone]

## 2022-02-16 ENCOUNTER — Ambulatory Visit (INDEPENDENT_AMBULATORY_CARE_PROVIDER_SITE_OTHER): Payer: Medicare Other | Admitting: Psychology

## 2022-02-16 ENCOUNTER — Ambulatory Visit: Payer: Medicare Other | Admitting: Psychiatry

## 2022-02-16 DIAGNOSIS — F4310 Post-traumatic stress disorder, unspecified: Secondary | ICD-10-CM

## 2022-02-16 DIAGNOSIS — F902 Attention-deficit hyperactivity disorder, combined type: Secondary | ICD-10-CM

## 2022-02-16 DIAGNOSIS — F411 Generalized anxiety disorder: Secondary | ICD-10-CM | POA: Diagnosis not present

## 2022-02-16 DIAGNOSIS — F428 Other obsessive-compulsive disorder: Secondary | ICD-10-CM | POA: Diagnosis not present

## 2022-02-16 DIAGNOSIS — Z4800 Encounter for change or removal of nonsurgical wound dressing: Secondary | ICD-10-CM

## 2022-02-16 NOTE — Progress Notes (Signed)
Danielle Bruce [id number] 1960/12/25 61 y.o.   Subjective:   Patient ID:  Danielle Bruce is a 61 y.o. (DOB 02/20/1961) female.  Chief Complaint:  Chief Complaint  Patient presents with   Follow-up   Depression   Anxiety   ADHD    HPI Danielle Bruce presents for follow-up of multiple dxes.  visit in April and taken off lamotrigine DT possible skin reaction.  Had appt with [facility] dermatology and they assume that skin reaction with itching was related to lamotrigine. 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 Per oncology most likely drug is lamotrigine. And she weaned off of the medication to determine if the cutaneous lesions are related. Lesions seem less without lamotrigine.  At this point skin reaction is presumed related to lamotrigine.  No other disease sx. She is well aware that she has been on lamotrigine for many years with good results for depression control. There is a risk of relapse as she comes off of the medication and she is worried about that. Smart phone helps her cognitive  And other productivity.    seen October 2020.  Restarted low dosage ADD bc concerns over STM, concentration and productivity.  Ritalin 5 mg twice daily.  Also suggested she retry N-acetylcysteine for mild cognitive complaints.   seen April 07, 2019 the following was noted: Mostly good.  Anxiety is mostly better, but depression is a little worse seasonally.  Not able to go help her daughter.   Low energy but does have motivation.  Ritalin helps energy and focus but doesn't take it regularly bc irregular sleep cycle.   Ativan only when having medical issues.    Therapy working on trauma issues is very hard.  Struggling with the fact she doesn't have memory of large parts of her past.   No manic sx off mood stabilizers.  Covid isolation reduce stressors overall and not  prominently depressed.   No meds were changed.  June 25, 2019 appointment the following is noted: F real worried about Covid despite vaccination.   Started fluvoxamine 25 on May 14, 2019 from PCP mainly for inflammation but possibly thinking she might be depressed. Seen benefit for energy and sleep pattern and more appropriate feelings and less numb.  Now realizes she had been depressed for awhile but just numb with depression.  Anniversaries of deaths of friends including last BF Danielle Bruce a little over a year ago.   Thinks of him daily. His 59 yo D died of unknown cause recently.  Stress D moved to beach. Wonders about increasing the fluvoxamine  Danielle Bruce living in [place] and hasn't seen him in a year. [name] on disability for depression. Patient reports more depression.  Plan: Realized lately she's depressed and started Luvox again for it and for the anti-inflammatory potential for her health.  Has seen some benefit.   Prior benefit at 25 mg daily.  Optiion increase but slightly bc med sensitive. AGREE increase fluvoxamine to 37.5 mg daily.  08/06/2019 appointment with the following noted: Increased fluvoxamine to 37.5 mg and it helped the depression. Could see a big difference and had been more depressed than she even realized. Outlook better and sleep pattern normalized.    More energy and motivation.   But also started having prolonged HA.  HA lasted a couple of weeks.   Assumed HA was DT increased fluvoxamine so reduced to 25 mg on  07/28/19.  07/29/19 Episode of confusion occurred also.  Went to doctor while confused.  They couldn't determine cause.    Resolved in 1 day. FM flare up since fall/winter. To beach with daughter 3 mos minimum to give her shots for IVF treatment. Patient reports panic recently and recent difficulty with anxiety.    Occ flashbacks.   Patient denies difficulty with sleep initiation or maintenance. Denies appetite disturbance.  Patient reports that energy and motivation  have been good.  Patient denies any difficulty with concentration.  Patient denies any suicidal ideation. Plan: AGREE increase fluvoxamine to 37.5 mg daily once HA is controlled for several weekcs.  01/01/20 appt with the following noted: Increased fluvoxamine for awhile but got more HA and reduced it. Anxiety is better in general now.  But is interested in increasing it DT coming winter. When anxiety is not good it catches her off guard. Likely had Covid in August but early test was negative.  Got prednisone but sick for 5 weeks.  Lost taste and smell still going on.  D also had Covid.  Felt really sick and terrified with bad mental health at the time.  Was traumatic for her.  Had been vaccinated.   Had panic attack at the ER and took a long time to get the Ativan.  D moving back to area and pt will have to help with childcare so hopes to more aggressively treat her health problems. 2 gkids and 2 more coming. Danielle Bruce married without kids yet. Plan: started Luvox again for it and for the anti-inflammatory potential for her health.  Has seen some benefit.   Prior benefit at 25 mg daily.  Optiion increase but slightly bc med sensitive. AGREE increase fluvoxamine to 37.5 mg daily once HA is controlled for several weeks.  03/03/2020 appointment with the following noted: On Day 10 of Covid.  2nd bout. A lot easier this time. Had gotten Luvox up to 50 mg daily and tolerated.  Then doubled it to 100 mg daily. Wonders about the proper dose for maintenance.   No taste and smell, exhausted, congestion and mild cough.  Energy is better than it was in the beginning.  Made her depressed and anxious again and that part is better also.  Last time had to go to ER with Covid.  Only ER visit in 2-3 years.  Less migraine than in the past. Overall was doing better with last couple months.  Trying to do anti-inflammatory diet.  Vegetarian, no dairy or gluten.  Natural sugars.   Still some anxiety. D moved back here and  pregnant with 2nd set of twins. Current twins are 59 yo.   Will restart trauma therapy and it's helped over the last couple of years.  More freedom from things.      Less daily dread with anxiety but still some panic which can be triggered.  Less OCD with fluvoxamine unless triggered.   Taking Ritalin 10 mg each AM and tolerating and benefiting. occ NM with few dreams overall. Plan: started Luvox again for it and for the anti-inflammatory potential for her health.  Has seen some benefit.   Prior benefit at 25 mg daily.  Med sensitive. AGREE increase fluvoxamine to  50  mg daily. Up to 100 mg daily if desired.   04/22/19 appt with following noted: Mostly good and randomly psycho.  Really busy with Colletta [place] [redacted] weeks pregnant with twins and twins 59 yo at home.  She's helping and her H is  away. Mo older and slower.  Pt overworking.  When with Colletta [place] kids interfere with sleep.  Doesn't eat as well with Colletta [place] affects her FM and energy.   Recently exhausted and needed rest but couldn't go home DT D had to go to hospital.  Her H critical at times and she lost it emotionally over this. Almost suicidal reaction with weird intrusive thoughts in response to the criticism.   Got a break and it helped.   Rheum adjusted Lyrica to BID. Had accident fall with one of babies and was bleeding.  She became hysterical.   Upset now that she couldn't control her emotions at the time.  Couldn't get herself under control to deal with the crisis.    Overall doing ok but doesn't handle stressors well and lose it.   Plan: AGREE with increase fluvoxamine to  50  mg daily. Up to 100 mg daily if desired.  06/23/2020 appointment with the following noted: Increased fluvoxamine 25 AM and 50 mg PM and tolerated it.  Been on this dose awhile.  Going to [place] May 6-19 and son taking her.  Working for [employer] and been successful. Also going to [place], [place].  Excited.   Occ overwhelmed. Starting therapy with Lonn Georgia and  looking forward to it and dealing with trauma stuff. Nervous about doing the therapy and getting decompensated like what happened last time. Prednisone hellps mood and body. Not as depressed as in the past.  Sleep is reasonably good.  Appetite is normal.  Anxiety intermittent.  No suicidal thoughts Plan: No med changes  08/19/2020 appointment with the following noted: Not good.  Dog died 2 week ago and was very close to her for 16 years.  She had been a great comfort during losses and health problems.  Dog helped her through periods of SI. Exhausted. Danielle Bruce in [place] and going in early July. Wonders how long this will last.  Also increased fluvoxamine and now out and having withdrawal. Disc difference between grief and depression. Gwyndolyn Saxon needs therapist who takes Medicaid. Plan: no med changes  01/10/21 appt noted: Had covid 3rd time. Still on fluvox 50, Ritalin 10 mg daily, Deplin, trazodone 50 mg HS. Helping with gkids and been sick a lot since September. Been doing so so overall.  At times is good. Son-in-law will be getting raise and D can quit work for a year or so. Danielle Bruce and his wife in [place] and that's good. Parents getting old. Both parents hearing problems and M more forgetful. Parents personality different.  Less therapy lately bc schedules.  Has dealt with a lot of trauma and will resume after new year and feels it has been helpful.  04/13/21 appt noted: More problems with appetite increase for a couple of years.  Always hungry.  Wonders about changing stimulant for this.  Weight up too much. Needs to lose 15+ # DT chronic pain including hips and knees. More depression.  Seasonally.  Episodic panic and anxiety chronic. Some intermittent conflict with daughter.   Needs Ativan intermittently. Still working through trauma issues. Continues therapy.   Haven't gotten back into church.  Not wanting to go out much. Plan: Benefit  Ritalin 10 mg AM and noon for cognitive and ADD  reasons and disc risk of palpitations.  Per request ok trial Vyvanse for binge eating and ADD instead of Ritalin per PCP suggestion May also help mood and weight loss should help pain. Caution it could cause anxiety  06/06/2021 appointment with the following  noted: Did get a prescription for Vyvanse 30 mg on 04/28/2021 filled.  Tried that in place of Ritalin. Better energy and focus with Vyvanse and less binge eating and lost 7-8 #.  Helped lose prednisone weight from November. Seems to wear off in 5-6 hours.   In afternoon when it wears off just wants to sit around.  No sig SE. Thinks she is still a little depressed and not enjoying things like she should. Chronic colitis with recent flare.   To [place] in May for a couple of weeks with son and his wife. Plan: Per request ok trial Vyvanse for binge eating and ADD and increase  to 40 mg daily per PCP suggestion  08/11/2021 appointment with the following noted: Tolerating increase Vyvanse 40 AM.  No SE.   Less crash with it and longer duration. Lost 14#.  Less craving.   Still kind of depressed.  B moved back after divorce.  F mad about it.  M health problems. Home more stressful.   Went to [place]. M acting odd and F cognitive problems. Poor sleep at home bc temp is so warm for parents. OC sx a little worse but not severe. Plan: Better with Vyvanse for binge eating and ADD and increase  to 40 mg daily per PCP suggestion May also help mood and weight loss should help pain. Continue fluvoxamine 50 mg twice daily  11/16/2021 appointment noted: Helping with gkids. Son in Sports coach still working out of state.  F not doing well with cognition and falls. 61yo. Handling stressors pretty well.  When irritable then takes a break. Enjoys grandkids 4 of them. D therapist and not working right now other than taking care of kids. Restarted PT which has helped in the past with pain and limitations physically. Starting therapy with Caroline Sauger end of  October. sTill taking vyvanse 40 and fluvoxamine 50 mg BID. Has missed some Vyvanse bc some days sleeping late. More productive with Vyvanse 40 mg AM, but still procrastinates. Can read again better and sustain attention better but not as productive as she would like. Still a fair amount of anxiety easily triggered anxiety at times and hopes therapy will help.  Claustrophobic easily with panic triggered. Depression is OK lately.  02/16/22 appt noted: Had asthma episode and had to leave.  She was not in severe distress and was able to verbalize that she could drive herself home.  Past psych meds: Lithium, carbamazepine, oxcarbazepine, topiramate, valproic acid, lamotrigine, gabapentin  Zyprexa, Seroquel, Latuda headaches, Geodon,  Risperidone, Stelazine,  Rexulti,  Wellbutrin with side effects,  sertraline irritability, duloxetine,  fluvoxamine 100 with Covid, fluoxetine, Stopped desipramine DT skin issues which now resolved.  It helped GI px.  buspirone side effects,   N-acetylcysteine,  pramipexole,  methylphenidate,  Adderall,  Vyvanse 40 Ambien with amnesia, trazodone, Ativan  Review of Systems:  Review of Systems  HENT:  Negative for congestion.   Gastrointestinal:  Positive for abdominal pain. Negative for nausea.  Musculoskeletal:  Positive for back pain and myalgias.  Neurological:  Positive for headaches. Negative for tremors and weakness.  Psychiatric/Behavioral:  Positive for dysphoric mood. The patient is nervous/anxious.   Less HA with new meds.  Medications: I have reviewed the patient's current medications.  Current Outpatient Medications  Medication Sig Dispense Refill   albuterol (PROVENTIL HFA;VENTOLIN HFA) 108 (90 Base) MCG/ACT inhaler Inhale 2 puffs into the lungs every 6 (six) hours as needed for wheezing or shortness of breath.  Ascorbic Acid (VITAMIN C) 100 MG tablet Take 100 mg by mouth daily.     azelastine (ASTELIN) 0.1 % nasal spray Place into both  nostrils 2 (two) times daily. Use in each nostril as directed     Azelastine HCl 0.15 % SOLN Place 2 sprays into both nostrils daily.     BEPREVE 1.5 % SOLN Place 1 drop into both eyes daily.      Calcium Carb-Cholecalciferol (CALCIUM 1000 + D) 1000-20 MG-MCG TABS Take by mouth.     Coenzyme Q10 (CO Q 10 PO) Take by mouth daily.     cyclobenzaprine (FLEXERIL) 10 MG tablet Take 10 mg by mouth at bedtime as needed.     diphenhydrAMINE (BENADRYL) 25 mg capsule Take 50 mg by mouth every 6 (six) hours as needed (HEADACHES).     EPINEPHrine 0.3 mg/0.3 mL IJ SOAJ injection      Erenumab-aooe 70 MG/ML SOAJ Inject into the skin every 28 (twenty-eight) days.     fluticasone (FLONASE) 50 MCG/ACT nasal spray Place 2 sprays into both nostrils daily. 16 g 0   fluvoxaMINE (LUVOX) 50 MG tablet TAKE 1 TABLET(50 MG) BY MOUTH TWICE DAILY 180 tablet 1   hydrocortisone (ANUSOL-HC) 25 MG suppository 1 suppository     ibuprofen (ADVIL) 800 MG tablet 1 tablet with food or milk as needed     L-methylfolate Calcium 15 MG TABS TAKE 1 TABLET BY MOUTH DAILY 30 tablet 11   levocetirizine (XYZAL) 5 MG tablet Take 5 mg by mouth every evening.  5   lisdexamfetamine (VYVANSE) 40 MG capsule Take 1 capsule (40 mg total) by mouth every morning. 30 capsule 0   lisdexamfetamine (VYVANSE) 40 MG capsule Take 1 capsule (40 mg total) by mouth daily. 30 capsule 0   lisdexamfetamine (VYVANSE) 40 MG capsule Take 1 capsule (40 mg total) by mouth daily. 30 capsule 0   LORazepam (ATIVAN) 1 MG tablet Take 1 tablet (1 mg total) by mouth every 8 (eight) hours as needed for anxiety. (takes 2 mg when having a medical procedure.) 30 tablet 1   LORazepam (ATIVAN) 2 MG/ML concentrated solution Take 0.5 mLs (1 mg total) by mouth every 8 (eight) hours as needed for anxiety (severe panic). 30 mL 1   methocarbamol (ROBAXIN) 500 MG tablet Take 1 tablet (500 mg total) by mouth daily as needed for muscle spasms. 30 tablet 1   montelukast (SINGULAIR) 10 MG  tablet Take 10 mg by mouth at bedtime.     NURTEC 75 MG TBDP Take 1 tablet by mouth daily as needed.     oxyCODONE-acetaminophen (PERCOCET/ROXICET) 5-325 MG per tablet Take 2 tablets by mouth daily as needed for moderate pain or severe pain (mighraine.).      polyethylene glycol (MIRALAX / GLYCOLAX) packet Take 17 g by mouth daily as needed for mild constipation.     pravastatin (PRAVACHOL) 40 MG tablet 1 tablet     pregabalin (LYRICA) 50 MG capsule Take 100 mg by mouth at bedtime. 1 tab in am and 1 tab HS.     PROLIA 60 MG/ML SOSY injection BRING TO THE OFFICE FOR INJECTION ON 02/15/2018 AS DIRECTED ONCE EVERY 6 MONTHS     promethazine (PHENERGAN) 25 MG tablet Take 25 mg by mouth every 6 (six) hours as needed for nausea.     traZODone (DESYREL) 50 MG tablet TAKE 1 TABLET(50 MG) BY MOUTH AT BEDTIME 90 tablet 1   TURMERIC PO Take by mouth daily.  VASCEPA 1 g CAPS TK 2 CS PO BID WC  3   VITAMIN D PO Take by mouth. Take 5025m daily     VOLTAREN 1 % GEL Apply 2 g topically 4 (four) times daily as needed (JOINT PAIN).   3   Wheat Dextrin (BENEFIBER PO) 1 Tablespoon     No current facility-administered medications for this visit.    Medication Side Effects: None  Allergies:  Allergies  Allergen Reactions   Sulfa Antibiotics Nausea And Vomiting    Causes pt to vomit blood Other reaction(s): vomiting blood   Gluten Meal Other (See Comments)    Sensitivity.    Lactose Intolerance (Gi) Nausea And Vomiting   Aspirin     Other reaction(s): colitis   Bromfed Dm [Pseudoeph-Bromphen-Dm]     Other reaction(s): strung out and hallucinating   Chlorpromazine     Other reaction(s): Dark thoughts   Desipramine     Other reaction(s): rash   Gabapentin     Other reaction(s): swelling / fuzzy vision   Hydrocodone     Other reaction(s): causes hangovers   Lamotrigine     Other reaction(s): rash   Metronidazole     Other reaction(s): vomiting blood   Morphine Sulfate     Other reaction(s):  tearful   Nsaids     Other reaction(s): colitis   Flagyl [Metronidazole Hcl] Nausea And Vomiting   Morphine And Related Other (See Comments)    Reaction: Depression, emotional    Past Medical History:  Diagnosis Date   Anemia    Anxiety    Arthritis    osteoarthritis   ASCUS (atypical squamous cells of undetermined significance) on Pap smear 05/06/2005   NEG HIGH RISK HPV--C&B BIOPSY BENIGN 12/2005   Asthma    Bipolar 2 disorder ([facility]    Cancer ([facility]    skin cancer - basal cell   Colon polyps    Complication of anesthesia    anxious afterwards, will get headaches    Constipation    Depression    Fibromyalgia 10/2013   GERD (gastroesophageal reflux disease)    Hearing loss on left    Heart murmur    never had any problems   Hemorrhoids    High cholesterol    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   cytology negative   IBS (irritable bowel syndrome)    Insomnia    Lymphocytic colitis    Lymphoma (HCC)    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   Bone marrow biopsy showes 8% plasma cells IgA Lambda   Migraines    Osteoarthritis    Osteopenia    Peripheral neuropathy    PTSD (post-traumatic stress disorder)     Family History  Problem Relation Age of Onset   Diabetes Father    Hypertension Father    Hyperlipidemia Father    Depression Daughter    Anxiety disorder Daughter    Asthma Daughter    Heart disease Maternal Grandfather    Heart disease Paternal Grandmother    Heart disease Paternal Grandfather    Depression Son    Depression Son    Anxiety disorder Son    Breast cancer Neg Hx     Social History   Socioeconomic History   Marital status: Divorced    Spouse name: Not on file   Number of children: 3   Years of education: Not on file   Highest education level: Not on file  Occupational History   Not on  file  Tobacco Use   Smoking status: Former    Types: Cigarettes    Quit date: 05/13/2014    Years since quitting: 7.7    Smokeless tobacco: Never   Tobacco comments:    social   Vaping Use   Vaping Use: Never used  Substance and Sexual Activity   Alcohol use: Yes    Alcohol/week: 0.0 standard drinks of alcohol    Comment: rare   Drug use: Never   Sexual activity: Not Currently    Comment: -1st intercourse 61 yo-More than 5 partners  Other Topics Concern   Not on file  Social History Narrative   Not on file   Social Determinants of Health   Financial Resource Strain: Not on file  Food Insecurity: Not on file  Transportation Needs: Not on file  Physical Activity: Not on file  Stress: Not on file  Social Connections: Not on file  Intimate Partner Violence: Not on file    Past Medical History, Surgical history, Social history, and Family history were reviewed and updated as appropriate.   Please see review of systems for further details on the patient's review from today.   Objective:   Physical Exam:  LMP 12/28/2010   Physical Exam Constitutional:      General: She is not in acute distress. Musculoskeletal:        General: No deformity.  Neurological:     Mental Status: She is alert and oriented to person, place, and time.     Coordination: Coordination normal.  Psychiatric:        Attention and Perception: Attention and perception normal. She does not perceive auditory or visual hallucinations.        Mood and Affect: Mood is anxious and depressed. Affect is not labile, angry, tearful or inappropriate.        Speech: Speech normal.        Behavior: Behavior normal.        Thought Content: Thought content normal. Thought content is not paranoid or delusional. Thought content does not include homicidal or suicidal ideation. Thought content does not include suicidal plan.        Cognition and Memory: Cognition and memory normal.        Judgment: Judgment normal.     Comments: Insight intact Depression not severe, but situational and better Affect better than last visit.      Lab  Review:     Component Value Date/Time   NA 142 08/01/2016 1713   NA 144 07/12/2015 1521   K 4.7 08/01/2016 1713   K 3.8 07/12/2015 1521   CL 107 08/01/2016 1713   CO2 23 08/01/2016 1713   CO2 27 07/12/2015 1521   GLUCOSE 95 08/01/2016 1713   GLUCOSE 82 07/12/2015 1521   BUN 14 08/01/2016 1713   BUN 10.3 07/12/2015 1521   CREATININE 0.87 08/01/2016 1713   CREATININE 0.8 07/12/2015 1521   CALCIUM 10.0 08/01/2016 1713   CALCIUM 9.9 07/12/2015 1521   PROT 7.4 08/01/2016 1713   PROT 7.4 07/12/2015 1521   ALBUMIN 4.8 08/01/2016 1713   ALBUMIN 4.4 07/12/2015 1521   AST 23 08/01/2016 1713   AST 22 07/12/2015 1521   ALT 26 08/01/2016 1713   ALT 31 07/12/2015 1521   ALKPHOS 55 08/01/2016 1713   ALKPHOS 54 07/12/2015 1521   BILITOT 0.7 08/01/2016 1713   BILITOT 0.40 07/12/2015 1521   GFRNONAA 76 08/01/2016 1713   GFRAA 88 08/01/2016 1713  Component Value Date/Time   WBC 5.9 08/01/2016 1713   RBC 4.71 08/01/2016 1713   HGB 14.6 08/01/2016 1713   HGB 14.1 07/12/2015 1521   HCT 43.1 08/01/2016 1713   HCT 41.5 07/12/2015 1521   PLT 254 08/01/2016 1713   PLT 248 07/12/2015 1521   MCV 91.5 08/01/2016 1713   MCV 92.2 07/12/2015 1521   MCH 31.0 08/01/2016 1713   MCHC 33.9 08/01/2016 1713   RDW 13.2 08/01/2016 1713   RDW 13.3 07/12/2015 1521   LYMPHSABS 2,006 08/01/2016 1713   LYMPHSABS 1.8 07/12/2015 1521   MONOABS 354 08/01/2016 1713   MONOABS 0.7 07/12/2015 1521   EOSABS 118 08/01/2016 1713   EOSABS 0.1 07/12/2015 1521   BASOSABS 0 08/01/2016 1713   BASOSABS 0.0 07/12/2015 1521    No results found for: "POCLITH", "LITHIUM"   No results found for: "PHENYTOIN", "PHENOBARB", "VALPROATE", "CBMZ"   .res Assessment: Plan:    There are no diagnoses linked to this encounter.  Past couple of years more obvious PTSD obvious has called into doubt the bipolar dix bc she's remained mood stable off the lamotrigine.    She has had symptoms consistent with hypomania in the  past but there is now some question as to whether they were PTSD related rather than truly bipolar 2.  Her son has had treatment resistant depression with possible bipolar disorder and her daughter has been diagnosed with bipolar disorder but also a history of borderline personality disorder.  So there is a family history of the spectrum.  started Luvox again for it and for the anti-inflammatory potential for her health.  Has seen some benefit.   Prior benefit fluvoxamine. Taking fluvoxamine 50 mg BID Clear benefit from fluvoxamine for mood quite significantly. No manic sx.  HA are better.   Stress management for mood sx. Health problems worsen mood episodically.   Disc stress son's mental health problems.  CBT around agoraphobia.  Need to push herself to get out more.  She plans to start with Bambi Cottle.  We discussed the short-term risks associated with benzodiazepines including sedation and increased fall risk among others.  Discussed long-term side effect risk including dependence, potential withdrawal symptoms, and the potential eventual dose-related risk of dementia.  But recent studies from 2020 dispute this association between benzodiazepines and dementia risk. Newer studies in 2020 do not support an association with dementia.   Keep this at a minimum given cognitive concerns.  She only uses it when triggered with anxiety usually medical.  She is not using it regularly. Continue Ativan prn 1 mg for panic and triggers.  Discussed potential benefits, risks, and side effects of stimulants with patient to include increased heart rate, palpitations, insomnia, increased anxiety, increased irritability, or decreased appetite.  Instructed patient to contact office if experiencing any significant tolerability issues.  Better with Vyvanse for binge eating and ADD at 40 mg daily per PCP suggestion May also help mood and weight loss should help pain.  Caution it could cause anxiety and other  SE Disc pending generic.  Disc dosing options and risk of skipping higher doses will be more noticeably problematic.  FU 3 mos  Lynder Parents, MD, DFAPA    Please see After Visit Summary for patient specific instructions.  Future Appointments  Date Time Provider [place]  02/17/2022 10:40 AM [facility] 1 [facility] [address] [facility]  02/23/2022  4:00 PM Cottle, Bambi G, LCSW [facility] None  02/24/2022 12:15 PM Altamese Dilling, PTA  [facility] None  02/28/2022 11:45 AM Danie Binder, PT [facility] None  02/28/2022  3:00 PM Cottle, Bambi G, LCSW [facility] None  03/01/2022 11:45 AM Myrene Galas L, PTA [facility] None  03/06/2022 11:45 AM Danie Binder, PT [facility] None  03/08/2022 11:45 AM Altamese Dilling, PTA [facility] None  03/09/2022  4:00 PM Cottle, Bambi G, LCSW [facility] None  03/14/2022 11:45 AM Danie Binder, PT [facility] None  03/15/2022 11:45 AM Myrene Galas L, PTA [facility] None  03/17/2022  2:00 PM Cottle, Bambi G, LCSW [facility] None  03/21/2022 11:45 AM Danie Binder, PT [facility] None  03/21/2022  4:00 PM Cottle, Bambi G, LCSW [facility] None  03/22/2022 11:45 AM Altamese Dilling, PTA [facility] None  03/27/2022 11:45 AM Danie Binder, PT [facility] None  03/28/2022 11:00 AM Cottle, Lucious [place], LCSW [facility] None  03/29/2022 11:45 AM Altamese Dilling, PTA [facility] None  04/03/2022 11:45 AM Danie Binder, PT [facility] None  04/05/2022 11:45 AM Altamese Dilling, PTA [facility] None    No orders of the defined types were placed in this encounter.      -------------------------------

## 2022-02-17 ENCOUNTER — Ambulatory Visit
Admission: RE | Admit: 2022-02-17 | Discharge: 2022-02-17 | Disposition: A | Discharge status: Home / Self Care | Payer: Medicare Other | Source: Ambulatory Visit | Attending: Neurological Surgery | Admitting: Neurological Surgery

## 2022-02-17 DIAGNOSIS — G4700 Insomnia, unspecified: Secondary | ICD-10-CM | POA: Diagnosis not present

## 2022-02-17 DIAGNOSIS — M542 Cervicalgia: Secondary | ICD-10-CM

## 2022-02-17 DIAGNOSIS — F3181 Bipolar II disorder: Secondary | ICD-10-CM | POA: Diagnosis not present

## 2022-02-17 DIAGNOSIS — F900 Attention-deficit hyperactivity disorder, predominantly inattentive type: Secondary | ICD-10-CM | POA: Diagnosis not present

## 2022-02-17 DIAGNOSIS — F411 Generalized anxiety disorder: Secondary | ICD-10-CM | POA: Diagnosis not present

## 2022-02-17 DIAGNOSIS — J45909 Unspecified asthma, uncomplicated: Secondary | ICD-10-CM | POA: Diagnosis not present

## 2022-02-17 DIAGNOSIS — E7800 Pure hypercholesterolemia, unspecified: Secondary | ICD-10-CM | POA: Diagnosis not present

## 2022-02-17 DIAGNOSIS — F4310 Post-traumatic stress disorder, unspecified: Secondary | ICD-10-CM | POA: Diagnosis not present

## 2022-02-17 DIAGNOSIS — G43909 Migraine, unspecified, not intractable, without status migrainosus: Secondary | ICD-10-CM | POA: Diagnosis not present

## 2022-02-17 DIAGNOSIS — E538 Deficiency of other specified B group vitamins: Secondary | ICD-10-CM | POA: Diagnosis not present

## 2022-02-17 DIAGNOSIS — D472 Monoclonal gammopathy: Secondary | ICD-10-CM | POA: Diagnosis not present

## 2022-02-17 DIAGNOSIS — G629 Polyneuropathy, unspecified: Secondary | ICD-10-CM | POA: Diagnosis not present

## 2022-02-17 DIAGNOSIS — Z0000 Encounter for general adult medical examination without abnormal findings: Secondary | ICD-10-CM | POA: Diagnosis not present
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
## 2022-02-21 ENCOUNTER — Other Ambulatory Visit: Payer: Self-pay | Admitting: Psychiatry

## 2022-02-21 DIAGNOSIS — F5105 Insomnia due to other mental disorder: Secondary | ICD-10-CM

## 2022-02-23 ENCOUNTER — Ambulatory Visit (INDEPENDENT_AMBULATORY_CARE_PROVIDER_SITE_OTHER): Payer: Medicare Other | Admitting: Psychology

## 2022-02-23 DIAGNOSIS — F902 Attention-deficit hyperactivity disorder, combined type: Secondary | ICD-10-CM

## 2022-02-23 DIAGNOSIS — F411 Generalized anxiety disorder: Secondary | ICD-10-CM | POA: Diagnosis not present

## 2022-02-23 DIAGNOSIS — F428 Other obsessive-compulsive disorder: Secondary | ICD-10-CM

## 2022-02-23 DIAGNOSIS — F4310 Post-traumatic stress disorder, unspecified: Secondary | ICD-10-CM | POA: Diagnosis not present

## 2022-02-24 ENCOUNTER — Encounter: Payer: Self-pay | Admitting: Physical Therapy

## 2022-02-24 ENCOUNTER — Ambulatory Visit: Payer: Medicare Other | Admitting: Physical Therapy

## 2022-02-24 DIAGNOSIS — R293 Abnormal posture: Secondary | ICD-10-CM

## 2022-02-24 DIAGNOSIS — M542 Cervicalgia: Secondary | ICD-10-CM

## 2022-02-24 DIAGNOSIS — M353 Polymyalgia rheumatica: Secondary | ICD-10-CM | POA: Diagnosis not present

## 2022-02-24 DIAGNOSIS — R252 Cramp and spasm: Secondary | ICD-10-CM

## 2022-02-24 DIAGNOSIS — M6281 Muscle weakness (generalized): Secondary | ICD-10-CM
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
## 2022-02-24 NOTE — Progress Notes (Signed)
[place] [place] Counselor/Therapist Progress Note  Patient ID: Danielle Bruce, MRN: [id number],    Date: 02/23/2022  Time Spent: 60 minutes  Treatment Type: Individual Therapy  Reported Symptoms: flashbacks, responding to triggers, depression, anxiety, being busy all of the time, dissociation  Mental Status Exam: Appearance:  Casual     Behavior: Appropriate  Motor: Restlestness  Speech/Language:  Clear and Coherent  Affect: Blunt  Mood: anxious  Thought process: normal  Thought content:   WNL  Sensory/Perceptual disturbances:   WNL  Orientation: oriented to person, place, time/date, and situation  Attention: Good  Concentration: Good  Memory: WNL  Fund of knowledge:  Good  Insight:   Good  Judgment:  Fair  Impulse Control: Good   Risk Assessment: Danger to Self:  No Self-injurious Behavior: No Danger to Others: No Duty to Warn:no Physical Aggression / Violence:No  Access to Firearms a concern: No  Gang Involvement:No   Subjective: The patient attended a face-to-face individual therapy session in the office today.  The patient presents as pleasant and cooperative.  We talked a little more about her response to the doctor that she was intimidated by and talked about her potentially writing a letter to the administration of the practice.  We talked about how she did stand up for herself in the session with him and we also talked about taking some of her reactivity away.  We will do that using EMDR at some point and I explained to her that we will look at a neural network map.  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  I encouraged the patient to think about how she phrases things and asked her to take out owning that she is mentally ill.  I educated her on neuroscience and how chemicals in the brain more to some degree.  I also recommended that she read  Switch on your Brain.   Interventions: Cognitive Behavioral Therapy, Mindfulness Meditation, Eye Movement Desensitization and Reprocessing (EMDR), Insight-Oriented, and Interpersonal  Diagnosis:PTSD (post-traumatic stress disorder)  Generalized anxiety disorder  Attention deficit hyperactivity disorder (ADHD), combined type  Obsessional thoughts  Plan: Plan of Care: Client Abilities/Strengths  Insightful, motivated, Supportive family Client Treatment Preferences  Outpatient Individual therapy/EMDR  Client Statement of Needs  "I think I have PTSD and I feel like I need another kind of therapy than talk therapy" Treatment Level  Outpatient Individual therapy  Symptoms  Demonstrates an exaggerated startle response.:  (Status: maintained). Depressed  or irritable mood.: (Status:maintained). Describes a reliving of the event,  particularly through dissociative flashbacks.: (Status: maintained). Displays a  significant decline in interest and engagement in activities.:  (Status: maintained). Displays significant psychological and/or physiological distress resulting from internal and external  clues that are reminiscent of the traumatic event.: (Status: maintained).  Experiences disturbances in sleep.:  (Status: maintained). Experiences disturbing  and persistent thoughts, images, and/or perceptions of the traumatic event.:  (Status: maintained). Experiences frequent nightmares.: (Status: maintained).  Feelings of hopelessness, worthlessness, or inappropriate guilt.: No Description Entered (Status:  improved). Has been exposed to a traumatic event involving actual or perceived threat of death or  serious injury.:  (Status: maintained). Impairment in social, occupational, or  other areas of functioning.:(Status: maintained). Intentionally avoids activities,  places, people, or objects (e.g., up-armored vehicles) that evoke memories of the event.: (Status: maintained). Intentionally avoids  thoughts, feelings, or discussions related  to  the traumatic event.: (Status: maintained). Reports difficulty concentrating as  well as feelings of guilt (Status:maintained). Reports response of intense fear,  helplessness, or horror to the traumatic event.:  (Status: maintained).  Problems Addressed  Unipolar Depression, Posttraumatic Stress Disorder (PTSD), Posttraumatic Stress Disorder (PTSD),   Posttraumatic Stress Disorder (PTSD), Posttraumatic Stress Disorder (PTSD)  Goals 1. Develop healthy thinking patterns and beliefs about self, others, and the world that lead to the alleviation and help prevent the relapse of  depression. Objective Identify and replace thoughts and beliefs that support depression. Target Date: 2023/01/04 Frequency: Weekly Progress: 0 Modality: individual Related Interventions 1. Explore and restructure underlying assumptions and beliefs reflected in biased self-talk that  may put the client at risk for relapse or recurrence. 2. Conduct Cognitive-Behavioral Therapy (see Cognitive Behavior Therapy by Olevia Bowens; Overcoming Depression by Lynita Lombard al.), beginning with helping the client learn the connection among  cognition, depressive feelings, and actions. 2. Eliminate or reduce the negative impact trauma related symptoms have  on social, occupational, and family functioning. Objective Learn and implement personal skills to manage challenging situations related to trauma. Target Date: 2023/01/04 Frequency: Weekly Progress: 0 Modality: individual 3. No longer avoids persons, places, activities, and objects that are  reminiscent of the traumatic event. Objective Participate in Eye Movement Desensitization and Reprocessing (EMDR) to reduce emotional distress  related to traumatic thoughts, feelings, and images. Target Date: 2023/01/04 Frequency: Weekly Progress: 0 Modality: individual   Related Interventions 1. Utilize Eye Movement Desensitization and  Reprocessing (EMDR) to reduce the client's  emotional reactivity to the traumatic event and reduce PTSD symptoms. Objective Learn and implement guided self-dialogue to manage thoughts, feelings, and urges brought on by  encounters with trauma-related situations. Target Date: 2024-11/07 Frequency: Weekly Progress: 0 Modality: individual Related Interventions 1. Teach the client a guided self-dialogue procedure in which he/she learns to recognize  maladaptive self-talk, challenges its biases, copes with engendered feelings, overcomes  avoidance, and reinforces his/her accomplishments; review and reinforce progress, problemsolve obstacles. 4. No longer experiences intrusive event recollections, avoidance of event  reminders, intense arousal, or disinterest in activities or  relationships. 5. Thinks about or openly discusses the traumatic event with others  without experiencing psychological or physiological distress. Diagnosis Axis  none F43.10 (Posttraumatic stress disorder) - Open - [Signifier: n/a] Posttraumatic Stress  Disorder  Conditions For Discharge Achievement of treatment goals and objectives   Alesana Magistro G Nichola Cieslinski, LCSW

## 2022-02-28 ENCOUNTER — Ambulatory Visit: Payer: Medicare Other | Attending: Family Medicine

## 2022-02-28 ENCOUNTER — Encounter: Payer: Self-pay | Admitting: Physical Therapy

## 2022-02-28 ENCOUNTER — Ambulatory Visit (INDEPENDENT_AMBULATORY_CARE_PROVIDER_SITE_OTHER): Payer: Medicare Other | Admitting: Psychology

## 2022-02-28 DIAGNOSIS — F4310 Post-traumatic stress disorder, unspecified: Secondary | ICD-10-CM | POA: Diagnosis not present

## 2022-02-28 DIAGNOSIS — M6281 Muscle weakness (generalized): Secondary | ICD-10-CM | POA: Insufficient documentation

## 2022-02-28 DIAGNOSIS — R252 Cramp and spasm: Secondary | ICD-10-CM | POA: Insufficient documentation

## 2022-02-28 DIAGNOSIS — F411 Generalized anxiety disorder: Secondary | ICD-10-CM | POA: Diagnosis not present

## 2022-02-28 DIAGNOSIS — M542 Cervicalgia: Secondary | ICD-10-CM | POA: Insufficient documentation

## 2022-02-28 DIAGNOSIS — M353 Polymyalgia rheumatica: Secondary | ICD-10-CM | POA: Diagnosis not present

## 2022-02-28 DIAGNOSIS — R293 Abnormal posture: Secondary | ICD-10-CM | POA: Diagnosis not present
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
## 2022-02-28 NOTE — Therapy (Addendum)
OUTPATIENT PHYSICAL THERAPY TREATMENT   Patient Name: Danielle Bruce MRN: [id number] DOB:[birth year], 62 y.o., female Today's Date: 02/28/2022 Progress Note Reporting Period 01/09/22 to 02/28/22  See note below for Objective Data and Assessment of Progress/Goals.         PT End of Session - 02/28/22 1226     Visit Number 19    Date for PT Re-Evaluation 04/17/22    Authorization Type Medicare B- needs KX  at 15 for 2024    Progress Note Due on Visit 29    PT Start Time 1138    PT Stop Time 1222    PT Time Calculation (min) 44 min    Activity Tolerance Patient tolerated treatment well    Behavior During Therapy [facility] for tasks assessed/performed                          Past Medical History:  Diagnosis Date   Anemia    Anxiety    Arthritis    osteoarthritis   ASCUS (atypical squamous cells of undetermined significance) on Pap smear 05/06/2005   NEG HIGH RISK HPV--C&B BIOPSY BENIGN 12/2005   Asthma    Bipolar 2 disorder ([place])    Cancer ([place])    skin cancer - basal cell   Colon polyps    Complication of anesthesia    anxious afterwards, will get headaches    Constipation    Depression    Fibromyalgia 10/2013   GERD (gastroesophageal reflux disease)    Hearing loss on left    Heart murmur    never had any problems   Hemorrhoids    High cholesterol    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   cytology negative   IBS (irritable bowel syndrome)    Insomnia    Lymphocytic colitis    Lymphoma ([facility])    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   Bone marrow biopsy showes 8% plasma cells IgA Lambda   Migraines    Osteoarthritis    Osteopenia    Peripheral neuropathy    PTSD (post-traumatic stress disorder)    Past Surgical History:  Procedure Laterality Date   BONE MARROW BIOPSY Left 12/18/2013   Plasma cell dyscrasia 8% population of plasma cells   BREAST BIOPSY Right    benign stereo   CESAREAN SECTION  02,54,27   CHOLECYSTECTOMY  N/A 10/16/2012   Procedure: LAPAROSCOPIC CHOLECYSTECTOMY;  Surgeon: Joyice Faster. Cornett, MD;  Location: [facility];  Service: General;  Laterality: N/A;   COLONOSCOPY     numerous times   DILATION AND CURETTAGE OF UTERUS     ESOPHAGOGASTRODUODENOSCOPY     HEMORRHOID SURGERY  1993   x3   IUD REMOVAL  02/2015   Mirena   LAPAROSCOPIC LYSIS OF ADHESIONS N/A 10/16/2012   Procedure: LAPAROSCOPIC LYSIS OF ADHESIONS;  Surgeon: Marcello Moores A. Cornett, MD;  Location: [facility];  Service: General;  Laterality: N/A;   LAPAROSCOPY N/A 10/16/2012   Procedure: LAPAROSCOPY DIAGNOSTIC;  Surgeon: Joyice Faster. Cornett, MD;  Location: [facility];  Service: General;  Laterality: N/A;   PELVIC LAPAROSCOPY     RADIOLOGY WITH ANESTHESIA N/A 12/16/2015   Procedure: MRI OF BRAIN WITH AND WITHOUT CONTRAST;  Surgeon: Medication Radiologist, MD;  Location: [facility];  Service: Radiology;  Laterality: N/A;   SHOULDER SURGERY  2007/2008   SPINE SURGERY  2010   cervical   Patient Active Problem List   Diagnosis Date Noted   GAD (generalized  anxiety disorder) 12/26/2017   OCD (obsessive compulsive disorder) 12/26/2017   PTSD (post-traumatic stress disorder) 12/26/2017   DDD (degenerative disc disease), cervical 07/14/2016   Primary osteoarthritis of both feet 07/14/2016   Primary osteoarthritis of both hands 07/14/2016   Other fatigue 07/14/2016   History of IBS 07/14/2016   Osteopenia of multiple sites 07/14/2016   Fibromyalgia 01/13/2016   MGUS (monoclonal gammopathy of unknown significance) 12/23/2013   Chronic cholecystitis without calculus 10/18/2012   Abdominal pain, unspecified site 10/18/2012   Nausea alone 10/18/2012   Unspecified constipation 10/18/2012   Depression 10/18/2012   Anxiety    ASCUS (atypical squamous cells of undetermined significance) on Pap smear    IUD    Hemorrhoids 11/29/2010   Abdominal pain, left upper quadrant 11/29/2010    PCP:  Mayra Neer, MD  REFERRING PROVIDER: Mayra Neer,  MD  REFERRING DIAG: fibromyalgia  THERAPY DIAG:  Abnormal posture - Plan: PT plan of care cert/re-cert  Cervicalgia - Plan: PT plan of care cert/re-cert  Muscle weakness (generalized) - Plan: PT plan of care cert/re-cert  Cramp and spasm - Plan: PT plan of care cert/re-cert  Polymyalgia rheumatica ([place]) - Plan: PT plan of care cert/re-cert  Rationale for Evaluation and Treatment Rehabilitation  ONSET DATE: chronic pain (fibromyalgia) with flare-up 2 months ago  SUBJECTIVE:                                                                                                                                                                                                         SUBJECTIVE STATEMENT: I have been taking steroids due to asthma and this has also helped my physical symptoms.  I have been doing yoga with my grand kids. I have a headache today.  I feel 20% better.   PERTINENT HISTORY:  Anxiety, bipolar disorder, depression, fibromyalgia, IBS, migraines, osteopenia, PTSD  PAIN:  Are you having pain? Yes: NPRS scale: neck: 1-2/10, headache today: 6/10 Pain location: Lt>Rt neck Pain description: sore, sharp, annoying Aggravating factors: activity, yard work, picking up heavy objects Relieving factors: muscle relaxers, rest, stretching  PRECAUTIONS: Other: chronic pain syndrome and depression Other: slow progression with exercise due to chronic condition  WEIGHT BEARING RESTRICTIONS No  FALLS:  Has patient fallen in last 6 months? No  LIVING ENVIRONMENT: Lives with: lives with their family Lives in: House/apartment  OCCUPATION: on disability  PLOF: Independent with basic ADLs and Leisure: yardwork, walking  Pt cares for her 11 young grandchildren- difficulty with care including lifting and carrying  PATIENT GOALS be more active with less  pain, exercise at the gym regularly, lift and carry grandchildren and laundry, sleep with fewer interruptions, get back to more  gardening.  OBJECTIVE:   DIAGNOSTIC FINDINGS: none recent   PATIENT SURVEYS: Eval: Fibromyalgia Impact Questionnaire: first 2 sections only: 49 12/26/21: 40  02/28/22: 28 (first 2 sections only)  COGNITION: Overall cognitive status: Within functional limits for tasks assessed  SENSATION: WFL  POSTURE: rounded shoulders and forward head  PALPATION: Diffuse palpable tenderness over bil neck, upper traps, thoracic, lumbar and gluteals with trigger points.    CERVICAL ROM:   Active ROM A/PROM (deg) eval A/ROM 12/26/21  Flexion 55   Extension 40   Right lateral flexion 30 35  Left lateral flexion 35 40  Right rotation 50 60  Left rotation 40 65   (Blank rows = not tested)  UPPER EXTREMITY ROM: UE A/ROM is limited by 20% into flexion and abduction with pain at end range.  Hip flexibility is limited by 25% in all directions with pain in all directions.  UPPER EXTREMITY MMT: UE: 4+/5, LE 4+/5 except hip flexors 4-/5  TODAY'S TREATMENT:  04/02/24: Recert complete today Prone STM elongation to upper quadrants Supine gentle elongation and traction to lower cervical spine Trigger Point Dry-Needling  Treatment instructions: Expect mild to moderate muscle soreness. S/S of pneumothorax if dry needled over a lung field, and to seek immediate medical attention should they occur. Patient verbalized understanding of these instructions and education.  Patient Consent Given: Yes Education handout provided: Previously provided Muscles treated: Bil cervical multifidi, upper traps and suboccipitals  Electrical stimulation performed: No Parameters: N/A Treatment response/outcome: twitch and elongation Elongation to bil neck after DN  02/24/22:Pt arrives for aquatic physical therapy. Treatment took place in 3.5-5.5 feet of water. Water temperature was 91 degrees f.. Pt entered the pool via stairs with mild use of the rails. Pt requires buoyancy of water for support and to offload joints  with strengthening exercises.  Seated water bench with 75% submersion Pt performed seated LE AROM exercises 20x in all planes, concurrent discussion of current status. 75% depth water walking with UE buoy weights reciprocally 10x in each direction, high knee marching 20x, holding water bells by side in tandem stance: UE 10x saggital plane Bil. Hip 3 ways 15x each Bil trying not to hold on for balance but requires intermittent UE dabs. Standing at wall core press with single buoy UE weight 2x10 followed by 10 min decompression float and bicycle.  02/15/22:Pt arrives for aquatic physical therapy. Treatment took place in 3.5-5.5 feet of water. Water temperature was 91 degrees f.. Pt entered the pool via stairs with mild use of the rails. Pt requires buoyancy of water for support and to offload joints with strengthening exercises.  Seated water bench with 75% submersion Pt performed seated LE AROM exercises 20x in all planes, concurrent discussion of current status. 75% depth water walking with UE buoy weights reciprocally 10x in each direction, high knee marching 20x, holding water bells by side in tandem stance: UE 10x saggital plane Bil. Hip 3 ways 15x each adding ankle resistance: bil trying not to hold on for balance but requires intermittent UE dabs. Standing at wall core press with double buoy UE weight 2x10 followed by underwater bicycle in front plank at the bench: 2x 1 min then 2x 3 min with 1 min rest in between  02/13/22: Prone STM elongation to upper quadrants Supine gentle elongation and traction to lower cervical spine Trigger Point Dry-Needling  Treatment instructions:  Expect mild to moderate muscle soreness. S/S of pneumothorax if dry needled over a lung field, and to seek immediate medical attention should they occur. Patient verbalized understanding of these instructions and education.  Patient Consent Given: Yes Education handout provided: Previously provided Muscles treated: Lt upper  trap, Lt cervical and thoracic multifidi, levator Electrical stimulation performed: No Parameters: N/A Treatment response/outcome: twitch and elongation Supine moist heat x10' end of session     PATIENT EDUCATION:  Education details: Access Code: [email or web address] (review from previous session). Discussed strategies to incorporate more movement into her day considering her depression.  Pt will work to implement these strategies.   Person educated: Patient Education method: Explanation, Demonstration, and Handouts Education comprehension: verbalized understanding and returned demonstration   HOME EXERCISE PROGRAM: Access Code: [email or web address]  ASSESSMENT:  CLINICAL IMPRESSION: Pt reports 20% overall pain reduction since the start of care.  She has been more consistent with daily exercise and is doing yoga with her grandchildren.  Pt is benefiting from aquatic exercise due to chronic pain and limited endurance on land.  Pt is making steady progress with PT and is improving her overall mobility and activity at home.  Fibormyalgia impact score is improved since last testing. Pt arrived with headache and session focused on manual therapy to address head and neck pain.  Pt with good response to DN and manual therapy with twitch and improved tissue mobility.  Patient will benefit from skilled PT to address the below impairments and improve overall function.   OBJECTIVE IMPAIRMENTS decreased activity tolerance, decreased endurance, difficulty walking, decreased strength, increased muscle spasms, impaired flexibility, improper body mechanics, postural dysfunction, and pain.   ACTIVITY LIMITATIONS carrying, lifting, sitting, standing, reach over head, hygiene/grooming, and locomotion level  PARTICIPATION LIMITATIONS: meal prep, cleaning, laundry, driving, community activity, and yard work  PERSONAL FACTORS Past/current experiences, Time since onset of injury/illness/exacerbation, and 3+ comorbidities: fibromyalgia  depression, anxiety,   are also affecting patient's functional outcome.   REHAB POTENTIAL: Good  CLINICAL DECISION MAKING: Evolving/moderate complexity  EVALUATION COMPLEXITY: Moderate   GOALS: Goals reviewed with patient? Yes  SHORT TERM GOALS: Target date: 03/20/22  Return to regular performance of HEP 2-3x/day to improve mobility  Baseline: stretching 2x/day (02/28/22) Goal status: In progress  2.  Report > or = to 25% reduction in cervical and lumbar pain with ADLs and self-care  Baseline: 20% better overall (02/28/2022) Goal status: in progress   3.  Reduce fibromyalgia impact score to < or = to 39  Baseline: 28 (02/28/22) Goal status: In progress     LONG TERM GOALS: Target date: 04/17/22  Be independent in advanced HEP Baseline: gentle exercise  Goal status: In progress   2.  Reduce fibromyalgia impact questionnaire to < or = to 21 Baseline: 28 (02/28/22) Goal status: Revised   3.  Lift and carry laundry and her grandchildren with > or = to 50% reduction in pain Baseline: 20% better (02/28/22) Goal status: In progress   4.  Report > or = to 50% reduction in neck and lumbar pain with ADLs and self-care  Baseline: 20% better (02/28/22) Goal status: In progress   5.  Return to yardwork verbalize appropriate rest breaks and mechanics  Baseline: hasn't been doing due to being sick (02/28/22) Goal status: In progress    PLAN: PT FREQUENCY: 2x/week  PT DURATION: 8 weeks  PLANNED INTERVENTIONS: Therapeutic exercises, Therapeutic activity, Neuromuscular re-education, Balance training, Gait training, Patient/Family education, Self Care, Joint mobilization, Aquatic Therapy, Dry Needling,  Spinal manipulation, Spinal mobilization, Cryotherapy, Moist heat, Taping, Traction, Manual therapy, and Re-evaluation  PLAN FOR NEXT SESSION:  manual as needed, manage exercises as needed.  Aquatics to manage chronic pain.  Sigurd Sos, PT 02/28/22 12:30 PM   [facility]  Services [address] [place], [place] [address] Phone # [phone] Fax [phone]

## 2022-03-01 ENCOUNTER — Other Ambulatory Visit: Payer: Self-pay | Admitting: Psychiatry

## 2022-03-01 ENCOUNTER — Encounter: Payer: Self-pay | Admitting: Physical Therapy

## 2022-03-01 ENCOUNTER — Ambulatory Visit: Payer: Medicare Other | Admitting: Physical Therapy

## 2022-03-01 DIAGNOSIS — M6281 Muscle weakness (generalized): Secondary | ICD-10-CM | POA: Diagnosis not present

## 2022-03-01 DIAGNOSIS — R252 Cramp and spasm: Secondary | ICD-10-CM | POA: Diagnosis not present

## 2022-03-01 DIAGNOSIS — M542 Cervicalgia: Secondary | ICD-10-CM | POA: Diagnosis not present

## 2022-03-01 DIAGNOSIS — R293 Abnormal posture: Secondary | ICD-10-CM | POA: Diagnosis not present

## 2022-03-01 DIAGNOSIS — F422 Mixed obsessional thoughts and acts: Secondary | ICD-10-CM

## 2022-03-01 DIAGNOSIS — F4310 Post-traumatic stress disorder, unspecified: Secondary | ICD-10-CM

## 2022-03-01 DIAGNOSIS — F4001 Agoraphobia with panic disorder: Secondary | ICD-10-CM

## 2022-03-01 DIAGNOSIS — F411 Generalized anxiety disorder: Secondary | ICD-10-CM

## 2022-03-01 DIAGNOSIS — M353 Polymyalgia rheumatica: Secondary | ICD-10-CM | POA: Diagnosis not present

## 2022-03-01 DIAGNOSIS — F331 Major depressive disorder, recurrent, moderate: Secondary | ICD-10-CM

## 2022-03-01 NOTE — Therapy (Signed)
OUTPATIENT PHYSICAL THERAPY TREATMENT   Patient Name: Danielle Bruce MRN: [id number] DOB:1960-04-12, 62 y.o., female Today's Date: 03/01/2022        PT End of Session - 03/01/22 1155     Visit Number 20    Date for PT Re-Evaluation 04/17/22    Authorization Type Medicare B- needs KX  at 53 for 2024    Progress Note Due on Visit 29    PT Start Time 1154    PT Stop Time 8127    PT Time Calculation (min) 41 min    Activity Tolerance Patient tolerated treatment well    Behavior During Therapy [facility] for tasks assessed/performed                          Past Medical History:  Diagnosis Date   Anemia    Anxiety    Arthritis    osteoarthritis   ASCUS (atypical squamous cells of undetermined significance) on Pap smear 05/06/2005   NEG HIGH RISK HPV--C&B BIOPSY BENIGN 12/2005   Asthma    Bipolar 2 disorder ([place])    Cancer ([place])    skin cancer - basal cell   Colon polyps    Complication of anesthesia    anxious afterwards, will get headaches    Constipation    Depression    Fibromyalgia 10/2013   GERD (gastroesophageal reflux disease)    Hearing loss on left    Heart murmur    never had any problems   Hemorrhoids    High cholesterol    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   cytology negative   IBS (irritable bowel syndrome)    Insomnia    Lymphocytic colitis    Lymphoma (H[facility])    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   Bone marrow biopsy showes 8% plasma cells IgA Lambda   Migraines    Osteoarthritis    Osteopenia    Peripheral neuropathy    PTSD (post-traumatic stress disorder)    Past Surgical History:  Procedure Laterality Date   BONE MARROW BIOPSY Left 12/18/2013   Plasma cell dyscrasia 8% population of plasma cells   BREAST BIOPSY Right    benign stereo   CESAREAN SECTION  51,70,01   CHOLECYSTECTOMY N/A 10/16/2012   Procedure: LAPAROSCOPIC CHOLECYSTECTOMY;  Surgeon: Joyice Faster. Cornett, MD;  Location: [facility];  Service:  General;  Laterality: N/A;   COLONOSCOPY     numerous times   DILATION AND CURETTAGE OF UTERUS     ESOPHAGOGASTRODUODENOSCOPY     HEMORRHOID SURGERY  1993   x3   IUD REMOVAL  02/2015   Mirena   LAPAROSCOPIC LYSIS OF ADHESIONS N/A 10/16/2012   Procedure: LAPAROSCOPIC LYSIS OF ADHESIONS;  Surgeon: Marcello Moores A. Cornett, MD;  Location: [facility];  Service: General;  Laterality: N/A;   LAPAROSCOPY N/A 10/16/2012   Procedure: LAPAROSCOPY DIAGNOSTIC;  Surgeon: Joyice Faster. Cornett, MD;  Location: [facility];  Service: General;  Laterality: N/A;   PELVIC LAPAROSCOPY     RADIOLOGY WITH ANESTHESIA N/A 12/16/2015   Procedure: MRI OF BRAIN WITH AND WITHOUT CONTRAST;  Surgeon: Medication Radiologist, MD;  Location: [facility];  Service: Radiology;  Laterality: N/A;   SHOULDER SURGERY  2007/2008   SPINE SURGERY  2010   cervical   Patient Active Problem List   Diagnosis Date Noted   GAD (generalized anxiety disorder) 12/26/2017   OCD (obsessive compulsive disorder) 12/26/2017   PTSD (post-traumatic stress disorder) 12/26/2017  DDD (degenerative disc disease), cervical 07/14/2016   Primary osteoarthritis of both feet 07/14/2016   Primary osteoarthritis of both hands 07/14/2016   Other fatigue 07/14/2016   History of IBS 07/14/2016   Osteopenia of multiple sites 07/14/2016   Fibromyalgia 01/13/2016   MGUS (monoclonal gammopathy of unknown significance) 12/23/2013   Chronic cholecystitis without calculus 10/18/2012   Abdominal pain, unspecified site 10/18/2012   Nausea alone 10/18/2012   Unspecified constipation 10/18/2012   Depression 10/18/2012   Anxiety    ASCUS (atypical squamous cells of undetermined significance) on Pap smear    IUD    Hemorrhoids 11/29/2010   Abdominal pain, left upper quadrant 11/29/2010    PCP:  Mayra Neer, MD  REFERRING PROVIDER: Mayra Neer, MD  REFERRING DIAG: fibromyalgia  THERAPY DIAG:  Abnormal posture  Cervicalgia  Muscle weakness (generalized)  Cramp and  spasm  Rationale for Evaluation and Treatment Rehabilitation  ONSET DATE: chronic pain (fibromyalgia) with flare-up 2 months ago  SUBJECTIVE:                                                                                                                                                                                                         SUBJECTIVE STATEMENT: Sorry I am late. I am not feeling great again, getting congested again.[place] Kitchen low energy. I am hoping the water will soothe my body.    PERTINENT HISTORY:  Anxiety, bipolar disorder, depression, fibromyalgia, IBS, migraines, osteopenia, PTSD  PAIN:  Are you having pain? Not really, just tired and feel run down. Pain location: Lt>Rt neck Pain description: sore, sharp, annoying Aggravating factors: activity, yard work, picking up heavy objects Relieving factors: muscle relaxers, rest, stretching  PRECAUTIONS: Other: chronic pain syndrome and depression Other: slow progression with exercise due to chronic condition  WEIGHT BEARING RESTRICTIONS No  FALLS:  Has patient fallen in last 6 months? No  LIVING ENVIRONMENT: Lives with: lives with their family Lives in: House/apartment  OCCUPATION: on disability  PLOF: Independent with basic ADLs and Leisure: yardwork, walking  Pt cares for her 77 young grandchildren- difficulty with care including lifting and carrying  PATIENT GOALS be more active with less pain, exercise at the gym regularly, lift and carry grandchildren and laundry, sleep with fewer interruptions, get back to more gardening.  OBJECTIVE:   DIAGNOSTIC FINDINGS: none recent   PATIENT SURVEYS: Eval: Fibromyalgia Impact Questionnaire: first 2 sections only: 49 12/26/21: 40  02/28/22: 28 (first 2 sections only)  COGNITION: Overall cognitive status: Within functional limits for tasks assessed  SENSATION: WFL  POSTURE: rounded shoulders  and forward head  PALPATION: Diffuse palpable tenderness over bil neck,  upper traps, thoracic, lumbar and gluteals with trigger points.    CERVICAL ROM:   Active ROM A/PROM (deg) eval A/ROM 12/26/21  Flexion 55   Extension 40   Right lateral flexion 30 35  Left lateral flexion 35 40  Right rotation 50 60  Left rotation 40 65   (Blank rows = not tested)  UPPER EXTREMITY ROM: UE A/ROM is limited by 20% into flexion and abduction with pain at end range.  Hip flexibility is limited by 25% in all directions with pain in all directions.  UPPER EXTREMITY MMT: UE: 4+/5, LE 4+/5 except hip flexors 4-/5  TODAY'S TREATMENT:   03/01/22:Pt arrives for aquatic physical therapy. Treatment took place in 3.5-5.5 feet of water. Water temperature was 91 degrees f.. Pt entered the pool via stairs with mild use of the rails. Pt requires buoyancy of water for support and to offload joints with strengthening exercises.  Seated water bench with 75% submersion Pt performed seated LE AROM exercises 20x in all planes, concurrent discussion of current status. 75% depth water walking with UE buoy weights reciprocally 10x in each direction, high knee marching 20x, holding water bells by side in tandem stance: UE 10x saggital plane Bil. Hip 3 ways 15x each Bil trying not to hold on for balance but requires intermittent UE dabs. Standing at wall core press with single buoy UE weight 2x10 followed by 51mn decompression float and bicycle in horseback, slowly.  142/59/56 Recert complete today Prone STM elongation to upper quadrants Supine gentle elongation and traction to lower cervical spine Trigger Point Dry-Needling  Treatment instructions: Expect mild to moderate muscle soreness. S/S of pneumothorax if dry needled over a lung field, and to seek immediate medical attention should they occur. Patient verbalized understanding of these instructions and education.  Patient Consent Given: Yes Education handout provided: Previously provided Muscles treated: Bil cervical multifidi, upper  traps and suboccipitals  Electrical stimulation performed: No Parameters: N/A Treatment response/outcome: twitch and elongation Elongation to bil neck after DN  02/24/22:Pt arrives for aquatic physical therapy. Treatment took place in 3.5-5.5 feet of water. Water temperature was 91 degrees f.. Pt entered the pool via stairs with mild use of the rails. Pt requires buoyancy of water for support and to offload joints with strengthening exercises.  Seated water bench with 75% submersion Pt performed seated LE AROM exercises 20x in all planes, concurrent discussion of current status. 75% depth water walking with UE buoy weights reciprocally 10x in each direction, high knee marching 20x, holding water bells by side in tandem stance: UE 10x saggital plane Bil. Hip 3 ways 15x each Bil trying not to hold on for balance but requires intermittent UE dabs. Standing at wall core press with single buoy UE weight 2x10 followed by in front plank at the bench: 2x 1 min then 2x 3 min with 1 min rest in between  02/15/22:Pt arrives for aquatic physical therapy. Treatment took place in 3.5-5.5 feet of water. Water temperature was 91 degrees f.. Pt entered the pool via stairs with mild use of the rails. Pt requires buoyancy of water for support and to offload joints with strengthening exercises.  Seated water bench with 75% submersion Pt performed seated LE AROM exercises 20x in all planes, concurrent discussion of current status. 75% depth water walking with UE buoy weights reciprocally 10x in each direction, high knee marching 20x, holding water bells by side in tandem stance:  UE 10x saggital plane Bil. Hip 3 ways 15x each adding ankle resistance: bil trying not to hold on for balance but requires intermittent UE dabs. Standing at wall core press with double buoy UE weight 2x10 followed by underwater bicycle in front plank at the bench: 2x 1 min then 2x 3 min with 1 min rest in between  02/13/22: Prone STM elongation to  upper quadrants Supine gentle elongation and traction to lower cervical spine Trigger Point Dry-Needling  Treatment instructions: Expect mild to moderate muscle soreness. S/S of pneumothorax if dry needled over a lung field, and to seek immediate medical attention should they occur. Patient verbalized understanding of these instructions and education.  Patient Consent Given: Yes Education handout provided: Previously provided Muscles treated: Lt upper trap, Lt cervical and thoracic multifidi, levator Electrical stimulation performed: No Parameters: N/A Treatment response/outcome: twitch and elongation Supine moist heat x10' end of session     PATIENT EDUCATION:  Education details: Access Code: [email or web address] (review from previous session). Discussed strategies to incorporate more movement into her day considering her depression.  Pt will work to implement these strategies.   Person educated: Patient Education method: Explanation, Demonstration, and Handouts Education comprehension: verbalized understanding and returned demonstration   HOME EXERCISE PROGRAM: Access Code: [email or web address]  ASSESSMENT:  CLINICAL IMPRESSION:  Pt arrives to aquatic PT session not feeling her best. Pt is having difficulty breathing today but was able to complete all her aquatic exercises just at a slower pace. Headache was resolved from prior dry needling session.   OBJECTIVE IMPAIRMENTS decreased activity tolerance, decreased endurance, difficulty walking, decreased strength, increased muscle spasms, impaired flexibility, improper body mechanics, postural dysfunction, and pain.   ACTIVITY LIMITATIONS carrying, lifting, sitting, standing, reach over head, hygiene/grooming, and locomotion level  PARTICIPATION LIMITATIONS: meal prep, cleaning, laundry, driving, community activity, and yard work  PERSONAL FACTORS Past/current experiences, Time since onset of injury/illness/exacerbation, and 3+ comorbidities: fibromyalgia  depression, anxiety,   are also affecting patient's functional outcome.   REHAB POTENTIAL: Good  CLINICAL DECISION MAKING: Evolving/moderate complexity  EVALUATION COMPLEXITY: Moderate   GOALS: Goals reviewed with patient? Yes  SHORT TERM GOALS: Target date: 03/20/22  Return to regular performance of HEP 2-3x/day to improve mobility  Baseline: stretching 2x/day (02/28/22) Goal status: In progress  2.  Report > or = to 25% reduction in cervical and lumbar pain with ADLs and self-care  Baseline: 20% better overall (02/28/2022) Goal status: in progress   3.  Reduce fibromyalgia impact score to < or = to 39  Baseline: 28 (02/28/22) Goal status: In progress     LONG TERM GOALS: Target date: 04/17/22  Be independent in advanced HEP Baseline: gentle exercise  Goal status: In progress   2.  Reduce fibromyalgia impact questionnaire to < or = to 21 Baseline: 28 (02/28/22) Goal status: Revised   3.  Lift and carry laundry and her grandchildren with > or = to 50% reduction in pain Baseline: 20% better (02/28/22) Goal status: In progress   4.  Report > or = to 50% reduction in neck and lumbar pain with ADLs and self-care  Baseline: 20% better (02/28/22) Goal status: In progress   5.  Return to yardwork verbalize appropriate rest breaks and mechanics  Baseline: hasn't been doing due to being sick (02/28/22) Goal status: In progress    PLAN: PT FREQUENCY: 2x/week  PT DURATION: 8 weeks  PLANNED INTERVENTIONS: Therapeutic exercises, Therapeutic activity, Neuromuscular re-education, Balance training, Gait training, Patient/Family education, Self Care, Joint mobilization,  Aquatic Therapy, Dry Needling, Spinal manipulation, Spinal mobilization, Cryotherapy, Moist heat, Taping, Traction, Manual therapy, and Re-evaluation  PLAN FOR NEXT SESSION:  manual as needed, manage exercises as needed.  Aquatics to manage chronic pain.  Myrene Galas, PTA 03/01/22 7:39 PM   [facility] [address], [address] [place], [place] [address] Phone # [phone] Fax [phone]

## 2022-03-06 ENCOUNTER — Ambulatory Visit: Payer: Medicare Other

## 2022-03-06 DIAGNOSIS — M6281 Muscle weakness (generalized): Secondary | ICD-10-CM

## 2022-03-06 DIAGNOSIS — M542 Cervicalgia: Secondary | ICD-10-CM

## 2022-03-06 DIAGNOSIS — R293 Abnormal posture: Secondary | ICD-10-CM

## 2022-03-06 DIAGNOSIS — M353 Polymyalgia rheumatica: Secondary | ICD-10-CM | POA: Diagnosis not present

## 2022-03-06 DIAGNOSIS — R252 Cramp and spasm: Secondary | ICD-10-CM | POA: Diagnosis not present

## 2022-03-06 NOTE — Therapy (Signed)
OUTPATIENT PHYSICAL THERAPY TREATMENT   Patient Name: Danielle Bruce MRN: [id number] DOB:26-Jul-1960, 63 y.o., female Today's Date: 03/06/2022        PT End of Session - 03/06/22 1224     Visit Number 21    Date for PT Re-Evaluation 04/17/22    Authorization Type Medicare B- needs KX  at 28 for 2024    Progress Note Due on Visit 29    PT Start Time 1152    PT Stop Time 1225    PT Time Calculation (min) 33 min    Activity Tolerance Patient tolerated treatment well    Behavior During Therapy [facility] for tasks assessed/performed                           Past Medical History:  Diagnosis Date   Anemia    Anxiety    Arthritis    osteoarthritis   ASCUS (atypical squamous cells of undetermined significance) on Pap smear 05/06/2005   NEG HIGH RISK HPV--C&B BIOPSY BENIGN 12/2005   Asthma    Bipolar 2 disorder ([place])    Cancer ([place])    skin cancer - basal cell   Colon polyps    Complication of anesthesia    anxious afterwards, will get headaches    Constipation    Depression    Fibromyalgia 10/2013   GERD (gastroesophageal reflux disease)    Hearing loss on left    Heart murmur    never had any problems   Hemorrhoids    High cholesterol    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   cytology negative   IBS (irritable bowel syndrome)    Insomnia    Lymphocytic colitis    Lymphoma (HCC)    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   Bone marrow biopsy showes 8% plasma cells IgA Lambda   Migraines    Osteoarthritis    Osteopenia    Peripheral neuropathy    PTSD (post-traumatic stress disorder)    Past Surgical History:  Procedure Laterality Date   BONE MARROW BIOPSY Left 12/18/2013   Plasma cell dyscrasia 8% population of plasma cells   BREAST BIOPSY Right    benign stereo   CESAREAN SECTION  78,29,56   CHOLECYSTECTOMY N/A 10/16/2012   Procedure: LAPAROSCOPIC CHOLECYSTECTOMY;  Surgeon: Joyice Faster. Cornett, MD;  Location: [facility];  Service:  General;  Laterality: N/A;   COLONOSCOPY     numerous times   DILATION AND CURETTAGE OF UTERUS     ESOPHAGOGASTRODUODENOSCOPY     HEMORRHOID SURGERY  1993   x3   IUD REMOVAL  02/2015   Mirena   LAPAROSCOPIC LYSIS OF ADHESIONS N/A 10/16/2012   Procedure: LAPAROSCOPIC LYSIS OF ADHESIONS;  Surgeon: Marcello Moores A. Cornett, MD;  Location: [facility];  Service: General;  Laterality: N/A;   LAPAROSCOPY N/A 10/16/2012   Procedure: LAPAROSCOPY DIAGNOSTIC;  Surgeon: Joyice Faster. Cornett, MD;  Location[facility];  Service: General;  Laterality: N/A;   PELVIC LAPAROSCOPY     RADIOLOGY WITH ANESTHESIA N/A 12/16/2015   Procedure: MRI OF BRAIN WITH AND WITHOUT CONTRAST;  Surgeon: Medication Radiologist, MD;  Location: [place];  Service: Radiology;  Laterality: N/A;   SHOULDER SURGERY  2007/2008   SPINE SURGERY  2010   cervical   Patient Active Problem List   Diagnosis Date Noted   GAD (generalized anxiety disorder) 12/26/2017   OCD (obsessive compulsive disorder) 12/26/2017   PTSD (post-traumatic stress disorder) 12/26/2017  DDD (degenerative disc disease), cervical 07/14/2016   Primary osteoarthritis of both feet 07/14/2016   Primary osteoarthritis of both hands 07/14/2016   Other fatigue 07/14/2016   History of IBS 07/14/2016   Osteopenia of multiple sites 07/14/2016   Fibromyalgia 01/13/2016   MGUS (monoclonal gammopathy of unknown significance) 12/23/2013   Chronic cholecystitis without calculus 10/18/2012   Abdominal pain, unspecified site 10/18/2012   Nausea alone 10/18/2012   Unspecified constipation 10/18/2012   Depression 10/18/2012   Anxiety    ASCUS (atypical squamous cells of undetermined significance) on Pap smear    IUD    Hemorrhoids 11/29/2010   Abdominal pain, left upper quadrant 11/29/2010    PCP:  Mayra Neer, MD  REFERRING PROVIDER: Mayra Neer, MD  REFERRING DIAG: fibromyalgia  THERAPY DIAG:  Abnormal posture  Muscle weakness (generalized)  Cervicalgia  Rationale  for Evaluation and Treatment Rehabilitation  ONSET DATE: chronic pain (fibromyalgia) with flare-up 2 months ago  SUBJECTIVE:                                                                                                                                                                                                         SUBJECTIVE STATEMENT: I have been on an antibiotic due to still being sick.     PERTINENT HISTORY:  Anxiety, bipolar disorder, depression, fibromyalgia, IBS, migraines, osteopenia, PTSD  PAIN:  Are you having pain? Not really, just tired and feel run down. Pain location: 2/10 Lt>Rt neck Pain description: sore, sharp, annoying Aggravating factors: activity, yard work, picking up heavy objects Relieving factors: muscle relaxers, rest, stretching  PRECAUTIONS: Other: chronic pain syndrome and depression Other: slow progression with exercise due to chronic condition  WEIGHT BEARING RESTRICTIONS No  FALLS:  Has patient fallen in last 6 months? No  LIVING ENVIRONMENT: Lives with: lives with their family Lives in: House/apartment  OCCUPATION: on disability  PLOF: Independent with basic ADLs and Leisure: yardwork, walking  Pt cares for her 90 young grandchildren- difficulty with care including lifting and carrying  PATIENT GOALS be more active with less pain, exercise at the gym regularly, lift and carry grandchildren and laundry, sleep with fewer interruptions, get back to more gardening.  OBJECTIVE:   DIAGNOSTIC FINDINGS: none recent   PATIENT SURVEYS: Eval: Fibromyalgia Impact Questionnaire: first 2 sections only: 49 12/26/21: 40  02/28/22: 28 (first 2 sections only)  COGNITION: Overall cognitive status: Within functional limits for tasks assessed  SENSATION: WFL  POSTURE: rounded shoulders and forward head  PALPATION: Diffuse palpable tenderness over bil neck, upper traps, thoracic, lumbar  and gluteals with trigger points.    CERVICAL ROM:    Active ROM A/PROM (deg) eval A/ROM 12/26/21  Flexion 55   Extension 40   Right lateral flexion 30 35  Left lateral flexion 35 40  Right rotation 50 60  Left rotation 40 65   (Blank rows = not tested)  UPPER EXTREMITY ROM: UE A/ROM is limited by 20% into flexion and abduction with pain at end range.  Hip flexibility is limited by 25% in all directions with pain in all directions.  UPPER EXTREMITY MMT: UE: 4+/5, LE 4+/5 except hip flexors 4-/5  TODAY'S TREATMENT:  03/06/22: NuStep: Level 1 x 6- PT present to discuss progress  Seated hamstring stretch 3x20 seconds  Open book stretch: x10 bil each Supine TA activation: ball squeeze 5" hold x10 Trunk rotation 3x20 seconds  Hip abduction with blue band: TA activation blue band x10 Manual:  Supine gentle elongation and traction to lower cervical spine   03/01/22:Pt arrives for aquatic physical therapy. Treatment took place in 3.5-5.5 feet of water. Water temperature was 91 degrees f.. Pt entered the pool via stairs with mild use of the rails. Pt requires buoyancy of water for support and to offload joints with strengthening exercises.  Seated water bench with 75% submersion Pt performed seated LE AROM exercises 20x in all planes, concurrent discussion of current status. 75% depth water walking with UE buoy weights reciprocally 10x in each direction, high knee marching 20x, holding water bells by side in tandem stance: UE 10x saggital plane Bil. Hip 3 ways 15x each Bil trying not to hold on for balance but requires intermittent UE dabs. Standing at wall core press with single buoy UE weight 2x10 followed by 14mn decompression float and bicycle in horseback, slowly.  19/0/30 Recert complete today Prone STM elongation to upper quadrants Supine gentle elongation and traction to lower cervical spine Trigger Point Dry-Needling  Treatment instructions: Expect mild to moderate muscle soreness. S/S of pneumothorax if dry needled over a lung  field, and to seek immediate medical attention should they occur. Patient verbalized understanding of these instructions and education.  Patient Consent Given: Yes Education handout provided: Previously provided Muscles treated: Bil cervical multifidi, upper traps and suboccipitals  Electrical stimulation performed: No Parameters: N/A Treatment response/outcome: twitch and elongation Elongation to bil neck after DN     PATIENT EDUCATION:  Education details: Access Code: [email or web address] (review from previous session). Discussed strategies to incorporate more movement into her day considering her depression.  Pt will work to implement these strategies.   Person educated: Patient Education method: Explanation, Demonstration, and Handouts Education comprehension: verbalized understanding and returned demonstration   HOME EXERCISE PROGRAM: Access Code: [email or web address]  ASSESSMENT:  CLINICAL IMPRESSION:  Pt arrives to aquatic PT session not feeling her best. Pt is having difficulty breathing today but was able to complete all her aquatic exercises just at a slower pace. Headache was resolved from prior dry needling session.   OBJECTIVE IMPAIRMENTS decreased activity tolerance, decreased endurance, difficulty walking, decreased strength, increased muscle spasms, impaired flexibility, improper body mechanics, postural dysfunction, and pain.   ACTIVITY LIMITATIONS carrying, lifting, sitting, standing, reach over head, hygiene/grooming, and locomotion level  PARTICIPATION LIMITATIONS: meal prep, cleaning, laundry, driving, community activity, and yard work  PERSONAL FACTORS Past/current experiences, Time since onset of injury/illness/exacerbation, and 3+ comorbidities: fibromyalgia depression, anxiety,   are also affecting patient's functional outcome.   REHAB POTENTIAL: Good  CLINICAL DECISION MAKING: Evolving/moderate complexity  EVALUATION COMPLEXITY: Moderate  GOALS: Goals reviewed with  patient? Yes  SHORT TERM GOALS: Target date: 03/20/22  Return to regular performance of HEP 2-3x/day to improve mobility  Baseline: stretching 2x/day (02/28/22) Goal status: In progress  2.  Report > or = to 25% reduction in cervical and lumbar pain with ADLs and self-care  Baseline: 20% better overall (02/28/2022) Goal status: in progress   3.  Reduce fibromyalgia impact score to < or = to 39  Baseline: 28 (02/28/22) Goal status: In progress     LONG TERM GOALS: Target date: 04/17/22  Be independent in advanced HEP Baseline: gentle exercise  Goal status: In progress   2.  Reduce fibromyalgia impact questionnaire to < or = to 21 Baseline: 28 (02/28/22) Goal status: Revised   3.  Lift and carry laundry and her grandchildren with > or = to 50% reduction in pain Baseline: 20% better (02/28/22) Goal status: In progress   4.  Report > or = to 50% reduction in neck and lumbar pain with ADLs and self-care  Baseline: 20% better (02/28/22) Goal status: In progress   5.  Return to yardwork verbalize appropriate rest breaks and mechanics  Baseline: hasn't been doing due to being sick (02/28/22) Goal status: In progress    PLAN: PT FREQUENCY: 2x/week  PT DURATION: 8 weeks  PLANNED INTERVENTIONS: Therapeutic exercises, Therapeutic activity, Neuromuscular re-education, Balance training, Gait training, Patient/Family education, Self Care, Joint mobilization, Aquatic Therapy, Dry Needling, Spinal manipulation, Spinal mobilization, Cryotherapy, Moist heat, Taping, Traction, Manual therapy, and Re-evaluation  PLAN FOR NEXT SESSION:  manual as needed, manage exercises as needed. Encourage gentle mobility. Aquatics to manage chronic pain.  Sigurd Sos, PT 03/06/22 12:26 PM   [facility] [address] [place], [place] [address] Phone # [phone] Fax [phone]

## 2022-03-08 ENCOUNTER — Encounter: Payer: Self-pay | Admitting: Physical Therapy

## 2022-03-08 ENCOUNTER — Ambulatory Visit: Payer: Medicare Other | Admitting: Physical Therapy

## 2022-03-08 ENCOUNTER — Other Ambulatory Visit: Payer: Self-pay | Admitting: Family Medicine

## 2022-03-08 DIAGNOSIS — M6281 Muscle weakness (generalized): Secondary | ICD-10-CM

## 2022-03-08 DIAGNOSIS — R252 Cramp and spasm: Secondary | ICD-10-CM | POA: Diagnosis not present

## 2022-03-08 DIAGNOSIS — M542 Cervicalgia: Secondary | ICD-10-CM

## 2022-03-08 DIAGNOSIS — R293 Abnormal posture: Secondary | ICD-10-CM | POA: Diagnosis not present

## 2022-03-08 DIAGNOSIS — M353 Polymyalgia rheumatica: Secondary | ICD-10-CM

## 2022-03-08 DIAGNOSIS — Z1231 Encounter for screening mammogram for malignant neoplasm of breast: Secondary | ICD-10-CM

## 2022-03-08 NOTE — Therapy (Signed)
OUTPATIENT PHYSICAL THERAPY TREATMENT   Patient Name: Danielle Bruce MRN: [id number] DOB:Jan 02, 1961, 62 y.o., female Today's Date: 03/08/2022        PT End of Session - 03/08/22 1605     Visit Number 22    Date for PT Re-Evaluation 04/17/22    Authorization Type Medicare B- needs KX  at 42 for 2024    Progress Note Due on Visit 29    PT Start Time 1200   Pt 15 min late   PT Stop Time 1231    PT Time Calculation (min) 31 min    Activity Tolerance Patient tolerated treatment well    Behavior During Therapy [facility] for tasks assessed/performed                           Past Medical History:  Diagnosis Date   Anemia    Anxiety    Arthritis    osteoarthritis   ASCUS (atypical squamous cells of undetermined significance) on Pap smear 05/06/2005   NEG HIGH RISK HPV--C&B BIOPSY BENIGN 12/2005   Asthma    Bipolar 2 disorder ([place])    Cancer ([place])    skin cancer - basal cell   Colon polyps    Complication of anesthesia    anxious afterwards, will get headaches    Constipation    Depression    Fibromyalgia 10/2013   GERD (gastroesophageal reflux disease)    Hearing loss on left    Heart murmur    never had any problems   Hemorrhoids    High cholesterol    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   cytology negative   IBS (irritable bowel syndrome)    Insomnia    Lymphocytic colitis    Lymphoma (HCC)    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   Bone marrow biopsy showes 8% plasma cells IgA Lambda   Migraines    Osteoarthritis    Osteopenia    Peripheral neuropathy    PTSD (post-traumatic stress disorder)    Past Surgical History:  Procedure Laterality Date   BONE MARROW BIOPSY Left 12/18/2013   Plasma cell dyscrasia 8% population of plasma cells   BREAST BIOPSY Right    benign stereo   CESAREAN SECTION  02,63,78   CHOLECYSTECTOMY N/A 10/16/2012   Procedure: LAPAROSCOPIC CHOLECYSTECTOMY;  Surgeon: Joyice Faster. Cornett, MD;  [facility]: [facility];  Service: General;  Laterality: N/A;   COLONOSCOPY     numerous times   DILATION AND CURETTAGE OF UTERUS     ESOPHAGOGASTRODUODENOSCOPY     HEMORRHOID SURGERY  1993   x3   IUD REMOVAL  02/2015   Mirena   LAPAROSCOPIC LYSIS OF ADHESIONS N/A 10/16/2012   Procedure: LAPAROSCOPIC LYSIS OF ADHESIONS;  Surgeon: Marcello Moores A. Cornett, MD;  [facility];  Service: General;  Laterality: N/A;   LAPAROSCOPY N/A 10/16/2012   Procedure: LAPAROSCOPY DIAGNOSTIC;  Surgeon: Joyice Faster. Cornett, MD;  [facility];  Service: General;  Laterality: N/A;   PELVIC LAPAROSCOPY     RADIOLOGY WITH ANESTHESIA N/A 12/16/2015   Procedure: MRI OF BRAIN WITH AND WITHOUT CONTRAST;  Surgeon: Medication Radiologist, MD;  Location: [place];  Service: Radiology;  Laterality: N/A;   SHOULDER SURGERY  2007/2008   SPINE SURGERY  2010   cervical   Patient Active Problem List   Diagnosis Date Noted   GAD (generalized anxiety disorder) 12/26/2017   OCD (obsessive compulsive disorder) 12/26/2017   PTSD (  post-traumatic stress disorder) 12/26/2017   DDD (degenerative disc disease), cervical 07/14/2016   Primary osteoarthritis of both feet 07/14/2016   Primary osteoarthritis of both hands 07/14/2016   Other fatigue 07/14/2016   History of IBS 07/14/2016   Osteopenia of multiple sites 07/14/2016   Fibromyalgia 01/13/2016   MGUS (monoclonal gammopathy of unknown significance) 12/23/2013   Chronic cholecystitis without calculus 10/18/2012   Abdominal pain, unspecified site 10/18/2012   Nausea alone 10/18/2012   Unspecified constipation 10/18/2012   Depression 10/18/2012   Anxiety    ASCUS (atypical squamous cells of undetermined significance) on Pap smear    IUD    Hemorrhoids 11/29/2010   Abdominal pain, left upper quadrant 11/29/2010    PCP:  Mayra Neer, MD  REFERRING PROVIDER: Mayra Neer, MD  REFERRING DIAG: fibromyalgia  THERAPY DIAG:  Abnormal posture  Muscle weakness  (generalized)  Cervicalgia  Polymyalgia rheumatica ([place])  Cramp and spasm  Rationale for Evaluation and Treatment Rehabilitation  ONSET DATE: chronic pain (fibromyalgia) with flare-up 2 months ago  SUBJECTIVE:                                                                                                                                                                                                         SUBJECTIVE STATEMENT: Sorry I am late. I finally feel much better, I can breathe better and I have much more energy.    PERTINENT HISTORY:  Anxiety, bipolar disorder, depression, fibromyalgia, IBS, migraines, osteopenia, PTSD  PAIN:  Are you having pain? No Pain location:  Pain description:  Aggravating factors: activity, yard work, picking up heavy objects Relieving factors: muscle relaxers, rest, stretching  PRECAUTIONS: Other: chronic pain syndrome and depression Other: slow progression with exercise due to chronic condition  WEIGHT BEARING RESTRICTIONS No  FALLS:  Has patient fallen in last 6 months? No  LIVING ENVIRONMENT: Lives with: lives with their family Lives in: House/apartment  OCCUPATION: on disability  PLOF: Independent with basic ADLs and Leisure: yardwork, walking  Pt cares for her 26 young grandchildren- difficulty with care including lifting and carrying  PATIENT GOALS be more active with less pain, exercise at the gym regularly, lift and carry grandchildren and laundry, sleep with fewer interruptions, get back to more gardening.  OBJECTIVE:   DIAGNOSTIC FINDINGS: none recent   PATIENT SURVEYS: Eval: Fibromyalgia Impact Questionnaire: first 2 sections only: 49 12/26/21: 40  02/28/22: 28 (first 2 sections only)  COGNITION: Overall cognitive status: Within functional limits for tasks assessed  SENSATION: WFL  POSTURE: rounded shoulders and forward head  PALPATION:  Diffuse palpable tenderness over bil neck, upper traps, thoracic, lumbar  and gluteals with trigger points.    CERVICAL ROM:   Active ROM A/PROM (deg) eval A/ROM 12/26/21  Flexion 55   Extension 40   Right lateral flexion 30 35  Left lateral flexion 35 40  Right rotation 50 60  Left rotation 40 65   (Blank rows = not tested)  UPPER EXTREMITY ROM: UE A/ROM is limited by 20% into flexion and abduction with pain at end range.  Hip flexibility is limited by 25% in all directions with pain in all directions.  UPPER EXTREMITY MMT: UE: 4+/5, LE 4+/5 except hip flexors 4-/5  TODAY'S TREATMENT:   03/08/22:Pt arrives for aquatic physical therapy. Treatment took place in 3.5-5.5 feet of water. Water temperature was 88 degrees f.. Pt entered the pool via stairs with mild use of the rails. Pt requires buoyancy of water for support and to offload joints with strengthening exercises.  Seated water bench with 75% submersion Pt performed seated LE AROM exercises 20x in all planes, concurrent discussion of current status. 75% depth water walking with UE buoy weights reciprocally 10x in each direction, high knee marching 20x, holding water bells by side in tandem stance: UE 10x saggital plane Bil. Hip 3 ways 15x each Bil trying not to hold on for balance but requires intermittent UE dabs. Standing at wall core press with double buoy UE weight 2x10 followed by sitting on yellow noodle for core strength 5 min then 38mn decompression float and bicycle in horseback.  03/06/22: NuStep: Level 1 x 6- PT present to discuss progress  Seated hamstring stretch 3x20 seconds  Open book stretch: x10 bil each Supine TA activation: ball squeeze 5" hold x10 Trunk rotation 3x20 seconds  Hip abduction with blue band: TA activation blue band x10 Manual:  Supine gentle elongation and traction to lower cervical spine   03/01/22:Pt arrives for aquatic physical therapy. Treatment took place in 3.5-5.5 feet of water. Water temperature was 91 degrees f.. Pt entered the pool via stairs with mild use  of the rails. Pt requires buoyancy of water for support and to offload joints with strengthening exercises.  Seated water bench with 75% submersion Pt performed seated LE AROM exercises 20x in all planes, concurrent discussion of current status. 75% depth water walking with UE buoy weights reciprocally 10x in each direction, high knee marching 20x, holding water bells by side in tandem stance: UE 10x saggital plane Bil. Hip 3 ways 15x each Bil trying not to hold on for balance but requires intermittent UE dabs. Standing at wall core press with single buoy UE weight 2x10 followed by 833m decompression float and bicycle in horseback, slowly.    PATIENT EDUCATION:  Education details: Access Code: [email or web address] from previous session). Discussed strategies to incorporate more movement into her day considering her depression.  Pt will work to implement these strategies.   Person educated: Patient Education method: Explanation, Demonstration, and Handouts Education comprehension: verbalized understanding and returned demonstration   HOME EXERCISE PROGRAM: Access Code: [email or web address]  ASSESSMENT:  CLINICAL IMPRESSION: Pt arrives to aquatic PT feeling much better with more energy. Essentially no pain before, during, and after exercising. Pt wishes she could find an affordable place to do water exercises.  OBJECTIVE IMPAIRMENTS decreased activity tolerance, decreased endurance, difficulty walking, decreased strength, increased muscle spasms, impaired flexibility, improper body mechanics, postural dysfunction, and pain.   ACTIVITY LIMITATIONS carrying, lifting, sitting, standing, reach over head, hygiene/grooming, and locomotion level  PARTICIPATION LIMITATIONS: meal prep, cleaning, laundry, driving, community activity, and yard work  PERSONAL FACTORS Past/current experiences, Time since onset of injury/illness/exacerbation, and 3+ comorbidities: fibromyalgia depression, anxiety,   are also affecting  patient's functional outcome.   REHAB POTENTIAL: Good  CLINICAL DECISION MAKING: Evolving/moderate complexity  EVALUATION COMPLEXITY: Moderate   GOALS: Goals reviewed with patient? Yes  SHORT TERM GOALS: Target date: 03/20/22  Return to regular performance of HEP 2-3x/day to improve mobility  Baseline: stretching 2x/day (02/28/22) Goal status: In progress  2.  Report > or = to 25% reduction in cervical and lumbar pain with ADLs and self-care  Baseline: 20% better overall (02/28/2022) Goal status: in progress   3.  Reduce fibromyalgia impact score to < or = to 39  Baseline: 28 (02/28/22) Goal status: In progress     LONG TERM GOALS: Target date: 04/17/22  Be independent in advanced HEP Baseline: gentle exercise  Goal status: In progress   2.  Reduce fibromyalgia impact questionnaire to < or = to 21 Baseline: 28 (02/28/22) Goal status: Revised   3.  Lift and carry laundry and her grandchildren with > or = to 50% reduction in pain Baseline: 20% better (02/28/22) Goal status: In progress   4.  Report > or = to 50% reduction in neck and lumbar pain with ADLs and self-care  Baseline: 20% better (02/28/22) Goal status: In progress   5.  Return to yardwork verbalize appropriate rest breaks and mechanics  Baseline: hasn't been doing due to being sick (02/28/22) Goal status: In progress    PLAN: PT FREQUENCY: 2x/week  PT DURATION: 8 weeks  PLANNED INTERVENTIONS: Therapeutic exercises, Therapeutic activity, Neuromuscular re-education, Balance training, Gait training, Patient/Family education, Self Care, Joint mobilization, Aquatic Therapy, Dry Needling, Spinal manipulation, Spinal mobilization, Cryotherapy, Moist heat, Taping, Traction, Manual therapy, and Re-evaluation  PLAN FOR NEXT SESSION:  manual as needed, manage exercises as needed. Encourage gentle mobility. Aquatics to manage chronic pain.  Myrene Galas, PTA 03/08/22 4:06 PM   [facility] [address] [place] [address] [place], [place] [address] Phone # [phone] Fax [phone]

## 2022-03-09 ENCOUNTER — Ambulatory Visit (INDEPENDENT_AMBULATORY_CARE_PROVIDER_SITE_OTHER): Payer: Medicare Other | Admitting: Psychology

## 2022-03-09 DIAGNOSIS — F411 Generalized anxiety disorder: Secondary | ICD-10-CM | POA: Diagnosis not present

## 2022-03-09 DIAGNOSIS — F4310 Post-traumatic stress disorder, unspecified: Secondary | ICD-10-CM | POA: Diagnosis not present

## 2022-03-09 NOTE — Progress Notes (Signed)
[facility] Counselor/Therapist Progress Note  Patient ID: Danielle Bruce, MRN: [id number],    Date: 03/09/2022  Time Spent: 60 minutes  Treatment Type: Individual Therapy  Reported Symptoms: flashbacks, responding to triggers, depression, anxiety, being busy all of the time, dissociation  Mental Status Exam: Appearance:  Casual     Behavior: Appropriate  Motor: Restlestness  Speech/Language:  Clear and Coherent  Affect: Blunt  Mood: anxious  Thought process: normal  Thought content:   WNL  Sensory/Perceptual disturbances:   WNL  Orientation: oriented to person, place, time/date, and situation  Attention: Good  Concentration: Good  Memory: WNL  Fund of knowledge:  Good  Insight:   Good  Judgment:  Fair  Impulse Control: Good   Risk Assessment: Danger to Self:  No Self-injurious Behavior: No Danger to Others: No Duty to Warn:no Physical Aggression / Violence:No  Access to Firearms a concern: No  Gang Involvement:No   Subjective: The patient attended a face-to-face individual therapy session via video visit today.  The patient gave verbal consent for the session to be on video.  The patient was in her home alone and the therapist was in her home office.  The patient talked about taking care of her father with her mother.  And also she talked about taking care of her daughter's children to help her daughter out.  The patient is very busy with caregiving between the two situations.  I gave the patient some positive feedback for taking care of of the needs of her family.  We also talked about the progress that she has made since being in therapy for a number of years.  She is very open to making changes in her life and doing what she needs to do to get herself in a good space.  The patient has a lot of physical ailments and we were talking about this in relation to the trauma that she has experienced in her life.  The patient reports that she has not been able to  fully get through the book, the body keeps the score.  She talked about how the book possibly triggered her previously before she was doing trauma therapy.  We talked about going very slow with the trauma of therapy but that once we started working on things we are hopeful that some of her physical ailments will resolve.  We are going to pursue more EMDR as we move forward but will go slow.  Used psychoeducation today as well as supportive therapy.  Interventions: Cognitive Behavioral Therapy, Mindfulness Meditation, Eye Movement Desensitization and Reprocessing (EMDR), Insight-Oriented, and Interpersonal  Diagnosis:PTSD (post-traumatic stress disorder)  Generalized anxiety disorder  Plan: Plan of Care: Client Abilities/Strengths  Insightful, motivated, Supportive family Client Treatment Preferences  Outpatient Individual therapy/EMDR  Client Statement of Needs  "I think I have PTSD and I feel like I need another kind of therapy than talk therapy" Treatment Level  Outpatient Individual therapy  Symptoms  Demonstrates an exaggerated startle response.:  (Status: maintained). Depressed  or irritable mood.: (Status:maintained). Describes a reliving of the event,  particularly through dissociative flashbacks.: (Status: maintained). Displays a  significant decline in interest and engagement in activities.:  (Status: maintained). Displays significant psychological and/or physiological distress resulting from internal and external  clues that are reminiscent of the traumatic event.: (Status: maintained).  Experiences disturbances in sleep.:  (Status: maintained). Experiences disturbing  and persistent thoughts, images, and/or perceptions of the traumatic event.:  (Status: maintained). Experiences frequent nightmares.: (Status:  maintained).  Feelings of hopelessness, worthlessness, or inappropriate guilt.: No Description Entered (Status:  improved). Has been exposed to a traumatic event involving  actual or perceived threat of death or  serious injury.:  (Status: maintained). Impairment in social, occupational, or  other areas of functioning.:(Status: maintained). Intentionally avoids activities,  places, people, or objects (e.g., up-armored vehicles) that evoke memories of the event.: (Status: maintained). Intentionally avoids thoughts, feelings, or discussions related  to the traumatic event.: (Status: maintained). Reports difficulty concentrating as  well as feelings of guilt (Status:maintained). Reports response of intense fear,  helplessness, or horror to the traumatic event.:  (Status: maintained).  Problems Addressed  Unipolar Depression, Posttraumatic Stress Disorder (PTSD), Posttraumatic Stress Disorder (PTSD),   Posttraumatic Stress Disorder (PTSD), Posttraumatic Stress Disorder (PTSD)  Goals 1. Develop healthy thinking patterns and beliefs about self, others, and the world that lead to the alleviation and help prevent the relapse of  depression. Objective Identify and replace thoughts and beliefs that support depression. Target Date: 2023/01/04 Frequency: Weekly Progress: 0 Modality: individual Related Interventions 1. Explore and restructure underlying assumptions and beliefs reflected in biased self-talk that  may put the client at risk for relapse or recurrence. 2. Conduct Cognitive-Behavioral Therapy (see Cognitive Behavior Therapy by Olevia Bowens; Overcoming Depression by Lynita Lombard al.), beginning with helping the client learn the connection among  cognition, depressive feelings, and actions. 2. Eliminate or reduce the negative impact trauma related symptoms have  on social, occupational, and family functioning. Objective Learn and implement personal skills to manage challenging situations related to trauma. Target Date: 2023/01/04 Frequency: Weekly Progress: 0 Modality: individual 3. No longer avoids persons, places, activities, and objects that are  reminiscent of  the traumatic event. Objective Participate in Eye Movement Desensitization and Reprocessing (EMDR) to reduce emotional distress  related to traumatic thoughts, feelings, and images. Target Date: 2023/01/04 Frequency: Weekly Progress: 0 Modality: individual   Related Interventions 1. Utilize Eye Movement Desensitization and Reprocessing (EMDR) to reduce the client's  emotional reactivity to the traumatic event and reduce PTSD symptoms. Objective Learn and implement guided self-dialogue to manage thoughts, feelings, and urges brought on by  encounters with trauma-related situations. Target Date: 2024-11/07 Frequency: Weekly Progress: 0 Modality: individual Related Interventions 1. Teach the client a guided self-dialogue procedure in which he/she learns to recognize  maladaptive self-talk, challenges its biases, copes with engendered feelings, overcomes  avoidance, and reinforces his/her accomplishments; review and reinforce progress, problemsolve obstacles. 4. No longer experiences intrusive event recollections, avoidance of event  reminders, intense arousal, or disinterest in activities or  relationships. 5. Thinks about or openly discusses the traumatic event with others  without experiencing psychological or physiological distress. Diagnosis Axis  none F43.10 (Posttraumatic stress disorder) - Open - [Signifier: n/a] Posttraumatic Stress  Disorder  Conditions For Discharge Achievement of treatment goals and objectives   Jenae Tomasello G Cyara Devoto, LCSW

## 2022-03-14 ENCOUNTER — Ambulatory Visit: Payer: Medicare Other

## 2022-03-14 DIAGNOSIS — L986 Other infiltrative disorders of the skin and subcutaneous tissue: Secondary | ICD-10-CM | POA: Diagnosis not present

## 2022-03-14 DIAGNOSIS — R293 Abnormal posture: Secondary | ICD-10-CM

## 2022-03-14 DIAGNOSIS — M542 Cervicalgia: Secondary | ICD-10-CM | POA: Diagnosis not present

## 2022-03-14 DIAGNOSIS — R252 Cramp and spasm: Secondary | ICD-10-CM | POA: Diagnosis not present

## 2022-03-14 DIAGNOSIS — M353 Polymyalgia rheumatica: Secondary | ICD-10-CM | POA: Diagnosis not present

## 2022-03-14 DIAGNOSIS — M6281 Muscle weakness (generalized): Secondary | ICD-10-CM

## 2022-03-14 DIAGNOSIS — C84A0 Cutaneous T-cell lymphoma, unspecified, unspecified site: Secondary | ICD-10-CM | POA: Diagnosis not present

## 2022-03-14 NOTE — Therapy (Signed)
OUTPATIENT PHYSICAL THERAPY TREATMENT   Patient Name: Danielle Bruce MRN: [id number] DOB:[birth year], 62 y.o., female Today's Date: 03/14/2022        PT End of Session - 03/14/22 1229     Visit Number 23    Date for PT Re-Evaluation 04/17/22    Authorization Type Medicare B- needs KX  at 46 for 2024    Progress Note Due on Visit 29    PT Start Time 1155   late   PT Stop Time 1229    PT Time Calculation (min) 34 min    Activity Tolerance Patient tolerated treatment well    Behavior During Therapy [facility] for tasks assessed/performed                            Past Medical History:  Diagnosis Date   Abdominal pain, unspecified site 10/18/2012   Anemia    Anxiety    Arthritis    osteoarthritis   ASCUS (atypical squamous cells of undetermined significance) on Pap smear 05/06/2005   NEG HIGH RISK HPV--C&B BIOPSY BENIGN 12/2005   Asthma    Bipolar 2 disorder ([place])    Cancer ([place])    skin cancer - basal cell   Colon polyps    Complication of anesthesia    anxious afterwards, will get headaches    Constipation    Depression    Fibromyalgia 10/2013   GERD (gastroesophageal reflux disease)    Hearing loss on left    Heart murmur    never had any problems   Hemorrhoids    High cholesterol    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   cytology negative   IBS (irritable bowel syndrome)    Insomnia    Lymphocytic colitis    Lymphoma ([facility])    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   Bone marrow biopsy showes 8% plasma cells IgA Lambda   Migraines    Nausea alone 10/18/2012   Osteoarthritis    Osteopenia    Peripheral neuropathy    PTSD (post-traumatic stress disorder)    Past Surgical History:  Procedure Laterality Date   BONE MARROW BIOPSY Left 12/18/2013   Plasma cell dyscrasia 8% population of plasma cells   BREAST BIOPSY Right    benign stereo   CESAREAN SECTION  [id number]   CHOLECYSTECTOMY N/A 10/16/2012   Procedure: LAPAROSCOPIC  CHOLECYSTECTOMY;  Surgeon: Joyice Faster. Cornett, MD;  Location: [facility];  Service: General;  Laterality: N/A;   COLONOSCOPY     numerous times   DILATION AND CURETTAGE OF UTERUS     ESOPHAGOGASTRODUODENOSCOPY     HEMORRHOID SURGERY  1993   x3   IUD REMOVAL  02/2015   Mirena   LAPAROSCOPIC LYSIS OF ADHESIONS N/A 10/16/2012   Procedure: LAPAROSCOPIC LYSIS OF ADHESIONS;  Surgeon: Marcello Moores A. Cornett, MD;  Location: [facility];  Service: General;  Laterality: N/A;   LAPAROSCOPY N/A 10/16/2012   Procedure: LAPAROSCOPY DIAGNOSTIC;  Surgeon: Joyice Faster. Cornett, MD;  Location: [facility];  Service: General;  Laterality: N/A;   PELVIC LAPAROSCOPY     RADIOLOGY WITH ANESTHESIA N/A 12/16/2015   Procedure: MRI OF BRAIN WITH AND WITHOUT CONTRAST;  Surgeon: Medication Radiologist, MD;  Location: [facility];  Service: Radiology;  Laterality: N/A;   SHOULDER SURGERY  2007/2008   SPINE SURGERY  2010   cervical   Patient Active Problem List   Diagnosis Date Noted   GAD (generalized anxiety disorder) 12/26/2017  OCD (obsessive compulsive disorder) 12/26/2017   PTSD (post-traumatic stress disorder) 12/26/2017   DDD (degenerative disc disease), cervical 07/14/2016   Primary osteoarthritis of both feet 07/14/2016   Primary osteoarthritis of both hands 07/14/2016   Other fatigue 07/14/2016   History of IBS 07/14/2016   Osteopenia of multiple sites 07/14/2016   Fibromyalgia 01/13/2016   MGUS (monoclonal gammopathy of unknown significance) 12/23/2013   Chronic cholecystitis without calculus 10/18/2012   Abdominal pain, unspecified site 10/18/2012   Nausea alone 10/18/2012   Unspecified constipation 10/18/2012   Depression 10/18/2012   Anxiety    ASCUS (atypical squamous cells of undetermined significance) on Pap smear    IUD    Hemorrhoids 11/29/2010   Abdominal pain, left upper quadrant 11/29/2010    PCP:  Mayra Neer, MD  REFERRING PROVIDER: Mayra Neer, MD  REFERRING DIAG: fibromyalgia  THERAPY DIAG:   Abnormal posture  Muscle weakness (generalized)  Cervicalgia  Polymyalgia rheumatica ([place])  Cramp and spasm  Rationale for Evaluation and Treatment Rehabilitation  ONSET DATE: chronic pain (fibromyalgia) with flare-up 2 months ago  SUBJECTIVE:                                                                                                                                                                                                         SUBJECTIVE STATEMENT: I'm aching from picking up grandchildren.  I want to work on my low back today.    PERTINENT HISTORY:  Anxiety, bipolar disorder, depression, fibromyalgia, IBS, migraines, osteopenia, PTSD  PAIN:  Are you having pain?Yes 5/10 Pain location: low back and gluteals  Pain description: sore, tight  Aggravating factors: activity, yard work, picking up heavy objects Relieving factors: muscle relaxers, rest, stretching  PRECAUTIONS: Other: chronic pain syndrome and depression Other: slow progression with exercise due to chronic condition  WEIGHT BEARING RESTRICTIONS No  FALLS:  Has patient fallen in last 6 months? No  LIVING ENVIRONMENT: Lives with: lives with their family Lives in: House/apartment  OCCUPATION: on disability  PLOF: Independent with basic ADLs and Leisure: yardwork, walking  Pt cares for her 69 young grandchildren- difficulty with care including lifting and carrying  PATIENT GOALS be more active with less pain, exercise at the gym regularly, lift and carry grandchildren and laundry, sleep with fewer interruptions, get back to more gardening.  OBJECTIVE:   DIAGNOSTIC FINDINGS: none recent   PATIENT SURVEYS: Eval: Fibromyalgia Impact Questionnaire: first 2 sections only: 49 12/26/21: 40  02/28/22: 28 (first 2 sections only)  COGNITION: Overall cognitive status: Within functional limits for tasks assessed  SENSATION: WFL  POSTURE: rounded shoulders and forward head  PALPATION: Diffuse  palpable tenderness over bil neck, upper traps, thoracic, lumbar and gluteals with trigger points.    CERVICAL ROM:   Active ROM A/PROM (deg) eval A/ROM 12/26/21  Flexion 55   Extension 40   Right lateral flexion 30 35  Left lateral flexion 35 40  Right rotation 50 60  Left rotation 40 65   (Blank rows = not tested)  UPPER EXTREMITY ROM: UE A/ROM is limited by 20% into flexion and abduction with pain at end range.  Hip flexibility is limited by 25% in all directions with pain in all directions.  UPPER EXTREMITY MMT: UE: 4+/5, LE 4+/5 except hip flexors 4-/5  TODAY'S TREATMENT:  03/14/22: Arm Bike: Level 1x 6 min (3/3)-PT present to discuss progress  Seated hamstring stretch 3x20 seconds  Seated figure 4 2x20 seconds  Supine TA activation: ball squeeze 5" hold x10 Hip abduction with blue band: TA activation blue band 2x10 Manual:  Trigger Point Dry-Needling  Treatment instructions: Expect mild to moderate muscle soreness. S/S of pneumothorax if dry needled over a lung field, and to seek immediate medical attention should they occur. Patient verbalized understanding of these instructions and education.  Patient Consent Given: Yes Education handout provided: Previously provided Muscles treated: bil lumbar multifidi and gluteals Treatment response/outcome: Utilized skilled palpation to identify trigger points.  During dry needling able to palpate muscle twitch and muscle elongation  Elongation and release to low back after DN Skilled palpation and monitoring by PT during dry needling   03/08/22:Pt arrives for aquatic physical therapy. Treatment took place in 3.5-5.5 feet of water. Water temperature was 88 degrees f.. Pt entered the pool via stairs with mild use of the rails. Pt requires buoyancy of water for support and to offload joints with strengthening exercises.  Seated water bench with 75% submersion Pt performed seated LE AROM exercises 20x in all planes, concurrent  discussion of current status. 75% depth water walking with UE buoy weights reciprocally 10x in each direction, high knee marching 20x, holding water bells by side in tandem stance: UE 10x saggital plane Bil. Hip 3 ways 15x each Bil trying not to hold on for balance but requires intermittent UE dabs. Standing at wall core press with double buoy UE weight 2x10 followed by sitting on yellow noodle for core strength 5 min then 9mn decompression float and bicycle in horseback.  03/06/22: NuStep: Level 1 x 6- PT present to discuss progress  Seated hamstring stretch 3x20 seconds  Open book stretch: x10 bil each Supine TA activation: ball squeeze 5" hold x10 Trunk rotation 3x20 seconds  Hip abduction with blue band: TA activation blue band x10 Manual:  Supine gentle elongation and traction to lower cervical spine   03/01/22:Pt arrives for aquatic physical therapy. Treatment took place in 3.5-5.5 feet of water. Water temperature was 91 degrees f.. Pt entered the pool via stairs with mild use of the rails. Pt requires buoyancy of water for support and to offload joints with strengthening exercises.  Seated water bench with 75% submersion Pt performed seated LE AROM exercises 20x in all planes, concurrent discussion of current status. 75% depth water walking with UE buoy weights reciprocally 10x in each direction, high knee marching 20x, holding water bells by side in tandem stance: UE 10x saggital plane Bil. Hip 3 ways 15x each Bil trying not to hold on for balance but requires intermittent UE dabs. Standing at wall core press with  single buoy UE weight 2x10 followed by 57mn decompression float and bicycle in horseback, slowly.    PATIENT EDUCATION:  Education details: Access Code: [email or web address](review from previous session). Discussed strategies to incorporate more movement into her day considering her depression.  Pt will work to implement these strategies.   Person educated: Patient Education method:  Explanation, Demonstration, and Handouts Education comprehension: verbalized understanding and returned demonstration   HOME EXERCISE PROGRAM: Access Code: [email or web address]  ASSESSMENT:  CLINICAL IMPRESSION: Pt reports increased back pain today due to carrying grandchildren over the past 2 days.  Session focused on lumbar flexibility and manual therapy/DN to address tension.  PT reviewed through HEP in [place] with visuals to highlight exercises that are best to do for LBP.  Pt had good response to DN with improved tissue mobility and twitch response.  Patient will benefit from skilled PT to address the below impairments and improve overall function.   OBJECTIVE IMPAIRMENTS decreased activity tolerance, decreased endurance, difficulty walking, decreased strength, increased muscle spasms, impaired flexibility, improper body mechanics, postural dysfunction, and pain.   ACTIVITY LIMITATIONS carrying, lifting, sitting, standing, reach over head, hygiene/grooming, and locomotion level  PARTICIPATION LIMITATIONS: meal prep, cleaning, laundry, driving, community activity, and yard work  PERSONAL FACTORS Past/current experiences, Time since onset of injury/illness/exacerbation, and 3+ comorbidities: fibromyalgia depression, anxiety,   are also affecting patient's functional outcome.   REHAB POTENTIAL: Good  CLINICAL DECISION MAKING: Evolving/moderate complexity  EVALUATION COMPLEXITY: Moderate   GOALS: Goals reviewed with patient? Yes  SHORT TERM GOALS: Target date: 03/20/22  Return to regular performance of HEP 2-3x/day to improve mobility  Baseline: stretching 2x/day (02/28/22) Goal status: In progress  2.  Report > or = to 25% reduction in cervical and lumbar pain with ADLs and self-care  Baseline: 20% better overall (02/28/2022) Goal status: in progress   3.  Reduce fibromyalgia impact score to < or = to 39  Baseline: 28 (02/28/22) Goal status: In progress     LONG TERM GOALS:  Target date: 04/17/22  Be independent in advanced HEP Baseline: gentle exercise  Goal status: In progress   2.  Reduce fibromyalgia impact questionnaire to < or = to 21 Baseline: 28 (02/28/22) Goal status: Revised   3.  Lift and carry laundry and her grandchildren with > or = to 50% reduction in pain Baseline: 20% better (02/28/22) Goal status: In progress   4.  Report > or = to 50% reduction in neck and lumbar pain with ADLs and self-care  Baseline: 20% better (02/28/22) Goal status: In progress   5.  Return to yardwork verbalize appropriate rest breaks and mechanics  Baseline: hasn't been doing due to being sick (02/28/22) Goal status: In progress    PLAN: PT FREQUENCY: 2x/week  PT DURATION: 8 weeks  PLANNED INTERVENTIONS: Therapeutic exercises, Therapeutic activity, Neuromuscular re-education, Balance training, Gait training, Patient/Family education, Self Care, Joint mobilization, Aquatic Therapy, Dry Needling, Spinal manipulation, Spinal mobilization, Cryotherapy, Moist heat, Taping, Traction, Manual therapy, and Re-evaluation  PLAN FOR NEXT SESSION:  manual as needed, manage exercises as needed. Encourage gentle mobility. Aquatics to manage chronic pain.  KSigurd Sos PT 03/14/22 12:31 PM   [facility]St [facility] CenterSpecial[facility]hab Services [address] [place] [address]Phone # [phone] [phone]

## 2022-03-15 ENCOUNTER — Ambulatory Visit: Payer: Medicare Other | Admitting: Physical Therapy

## 2022-03-15 NOTE — Therapy (Deleted)
OUTPATIENT PHYSICAL THERAPY TREATMENT   Patient Name: Danielle Bruce MRN: [id number] DOB:[birth year], 62 y.o., female Today's Date: 03/14/2022        PT End of Session - 03/14/22 1229     Visit Number 23    Date for PT Re-Evaluation 04/17/22    Authorization Type Medicare B- needs KX  at 62 for 2024    Progress Note Due on Visit 29    PT Start Time 1155   late   PT Stop Time 1229    PT Time Calculation (min) 34 min    Activity Tolerance Patient tolerated treatment well    Behavior During Therapy [facility] for tasks assessed/performed                            Past Medical History:  Diagnosis Date   Abdominal pain, unspecified site 10/18/2012   Anemia    Anxiety    Arthritis    osteoarthritis   ASCUS (atypical squamous cells of undetermined significance) on Pap smear 05/06/2005   NEG HIGH RISK HPV--C&B BIOPSY BENIGN 12/2005   Asthma    Bipolar 2 disorder ([place])    Cancer ([place])    skin cancer - basal cell   Colon polyps    Complication of anesthesia    anxious afterwards, will get headaches    Constipation    Depression    Fibromyalgia 10/2013   GERD (gastroesophageal reflux disease)    Hearing loss on left    Heart murmur    never had any problems   Hemorrhoids    High cholesterol    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   cytology negative   IBS (irritable bowel syndrome)    Insomnia    Lymphocytic colitis    Lymphoma (HCC)    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   Bone marrow biopsy showes 8% plasma cells IgA Lambda   Migraines    Nausea alone 10/18/2012   Osteoarthritis    Osteopenia    Peripheral neuropathy    PTSD (post-traumatic stress disorder)    Past Surgical History:  Procedure Laterality Date   BONE MARROW BIOPSY Left 12/18/2013   Plasma cell dyscrasia 8% population of plasma cells   BREAST BIOPSY Right    benign stereo   CESAREAN SECTION  [id number]   CHOLECYSTECTOMY N/A 10/16/2012   Procedure: LAPAROSCOPIC  CHOLECYSTECTOMY;  Surgeon: Joyice Faster. Cornett, MD;  Location: [facility];  Service: General;  Laterality: N/A;   COLONOSCOPY     numerous times   DILATION AND CURETTAGE OF UTERUS     ESOPHAGOGASTRODUODENOSCOPY     HEMORRHOID SURGERY  1993   x3   IUD REMOVAL  02/2015   Mirena   LAPAROSCOPIC LYSIS OF ADHESIONS N/A 10/16/2012   Procedure: LAPAROSCOPIC LYSIS OF ADHESIONS;  Surgeon: Marcello Moores A. Cornett, MD;  Location: [facility];  Service: General;  Laterality: N/A;   LAPAROSCOPY N/A 10/16/2012   Procedure: LAPAROSCOPY DIAGNOSTIC;  Surgeon: Joyice Faster. Cornett, MD;  Location: [facility];  Service: General;  Laterality: N/A;   PELVIC LAPAROSCOPY     RADIOLOGY WITH ANESTHESIA N/A 12/16/2015   Procedure: MRI OF BRAIN WITH AND WITHOUT CONTRAST;  Surgeon: Medication Radiologist, MD;  Location: [facility];  Service: Radiology;  Laterality: N/A;   SHOULDER SURGERY  2007/2008   SPINE SURGERY  2010   cervical   Patient Active Problem List   Diagnosis Date Noted   GAD (generalized anxiety disorder) 12/26/2017  OCD (obsessive compulsive disorder) 12/26/2017   PTSD (post-traumatic stress disorder) 12/26/2017   DDD (degenerative disc disease), cervical 07/14/2016   Primary osteoarthritis of both feet 07/14/2016   Primary osteoarthritis of both hands 07/14/2016   Other fatigue 07/14/2016   History of IBS 07/14/2016   Osteopenia of multiple sites 07/14/2016   Fibromyalgia 01/13/2016   MGUS (monoclonal gammopathy of unknown significance) 12/23/2013   Chronic cholecystitis without calculus 10/18/2012   Abdominal pain, unspecified site 10/18/2012   Nausea alone 10/18/2012   Unspecified constipation 10/18/2012   Depression 10/18/2012   Anxiety    ASCUS (atypical squamous cells of undetermined significance) on Pap smear    IUD    Hemorrhoids 11/29/2010   Abdominal pain, left upper quadrant 11/29/2010    PCP:  Mayra Neer, MD  REFERRING PROVIDER: Mayra Neer, MD  REFERRING DIAG: fibromyalgia  THERAPY DIAG:   Abnormal posture  Muscle weakness (generalized)  Cervicalgia  Polymyalgia rheumatica ([place])  Cramp and spasm  Rationale for Evaluation and Treatment Rehabilitation  ONSET DATE: chronic pain (fibromyalgia) with flare-up 2 months ago  SUBJECTIVE:                                                                                                                                                                                                         SUBJECTIVE STATEMENT: PERTINENT HISTORY:  Anxiety, bipolar disorder, depression, fibromyalgia, IBS, migraines, osteopenia, PTSD  PAIN:  Are you having pain?Yes 5/10 Pain location: low back and gluteals  Pain description: sore, tight  Aggravating factors: activity, yard work, picking up heavy objects Relieving factors: muscle relaxers, rest, stretching  PRECAUTIONS: Other: chronic pain syndrome and depression Other: slow progression with exercise due to chronic condition  WEIGHT BEARING RESTRICTIONS No  FALLS:  Has patient fallen in last 6 months? No  LIVING ENVIRONMENT: Lives with: lives with their family Lives in: House/apartment  OCCUPATION: on disability  PLOF: Independent with basic ADLs and Leisure: yardwork, walking  Pt cares for her 57 young grandchildren- difficulty with care including lifting and carrying  PATIENT GOALS be more active with less pain, exercise at the gym regularly, lift and carry grandchildren and laundry, sleep with fewer interruptions, get back to more gardening.  OBJECTIVE:   DIAGNOSTIC FINDINGS: none recent   PATIENT SURVEYS: Eval: Fibromyalgia Impact Questionnaire: first 2 sections only: 49 12/26/21: 40  02/28/22: 28 (first 2 sections only)  COGNITION: Overall cognitive status: Within functional limits for tasks assessed  SENSATION: WFL  POSTURE: rounded shoulders and forward head  PALPATION: Diffuse palpable tenderness over bil neck, upper traps,  thoracic, lumbar and gluteals with trigger  points.    CERVICAL ROM:   Active ROM A/PROM (deg) eval A/ROM 12/26/21  Flexion 55   Extension 40   Right lateral flexion 30 35  Left lateral flexion 35 40  Right rotation 50 60  Left rotation 40 65   (Blank rows = not tested)  UPPER EXTREMITY ROM: UE A/ROM is limited by 20% into flexion and abduction with pain at end range.  Hip flexibility is limited by 25% in all directions with pain in all directions.  UPPER EXTREMITY MMT: UE: 4+/5, LE 4+/5 except hip flexors 4-/5  TODAY'S TREATMENT:  03/14/22: Arm Bike: Level 1x 6 min (3/3)-PT present to discuss progress  Seated hamstring stretch 3x20 seconds  Seated figure 4 2x20 seconds  Supine TA activation: ball squeeze 5" hold x10 Hip abduction with blue band: TA activation blue band 2x10 Manual:  Trigger Point Dry-Needling  Treatment instructions: Expect mild to moderate muscle soreness. S/S of pneumothorax if dry needled over a lung field, and to seek immediate medical attention should they occur. Patient verbalized understanding of these instructions and education.  Patient Consent Given: Yes Education handout provided: Previously provided Muscles treated: bil lumbar multifidi and gluteals Treatment response/outcome: Utilized skilled palpation to identify trigger points.  During dry needling able to palpate muscle twitch and muscle elongation  Elongation and release to low back after DN Skilled palpation and monitoring by PT during dry needling   03/08/22:Pt arrives for aquatic physical therapy. Treatment took place in 3.5-5.5 feet of water. Water temperature was 88 degrees f.. Pt entered the pool via stairs with mild use of the rails. Pt requires buoyancy of water for support and to offload joints with strengthening exercises.  Seated water bench with 75% submersion Pt performed seated LE AROM exercises 20x in all planes, concurrent discussion of current status. 75% depth water walking with UE buoy weights reciprocally 10x in  each direction, high knee marching 20x, holding water bells by side in tandem stance: UE 10x saggital plane Bil. Hip 3 ways 15x each Bil trying not to hold on for balance but requires intermittent UE dabs. Standing at wall core press with double buoy UE weight 2x10 followed by sitting on yellow noodle for core strength 5 min then 49mn decompression float and bicycle in horseback.  03/06/22: NuStep: Level 1 x 6- PT present to discuss progress  Seated hamstring stretch 3x20 seconds  Open book stretch: x10 bil each Supine TA activation: ball squeeze 5" hold x10 Trunk rotation 3x20 seconds  Hip abduction with blue band: TA activation blue band x10 Manual:  Supine gentle elongation and traction to lower cervical spine   03/01/22:Pt arrives for aquatic physical therapy. Treatment took place in 3.5-5.5 feet of water. Water temperature was 91 degrees f.. Pt entered the pool via stairs with mild use of the rails. Pt requires buoyancy of water for support and to offload joints with strengthening exercises.  Seated water bench with 75% submersion Pt performed seated LE AROM exercises 20x in all planes, concurrent discussion of current status. 75% depth water walking with UE buoy weights reciprocally 10x in each direction, high knee marching 20x, holding water bells by side in tandem stance: UE 10x saggital plane Bil. Hip 3 ways 15x each Bil trying not to hold on for balance but requires intermittent UE dabs. Standing at wall core press with single buoy UE weight 2x10 followed by 841m decompression float and bicycle in horseback, slowly.    PATIENT  EDUCATION:  Education details: Access Code: [email or web address] (review from previous session). Discussed strategies to incorporate more movement into her day considering her depression.  Pt will work to implement these strategies.   Person educated: Patient Education method: Explanation, Demonstration, and Handouts Education comprehension: verbalized understanding and  returned demonstration   HOME EXERCISE PROGRAM: Access Code: [email or web address]  ASSESSMENT:  CLINICAL IMPRESSION: Pt reports increased back pain today due to carrying grandchildren over the past 2 days.  Session focused on lumbar flexibility and manual therapy/DN to address tension.  PT reviewed through HEP in [place] verbally with visuals to highlight exercises that are best to do for LBP.  Pt had good response to DN with improved tissue mobility and twitch response.  Patient will benefit from skilled PT to address the below impairments and improve overall function.   OBJECTIVE IMPAIRMENTS decreased activity tolerance, decreased endurance, difficulty walking, decreased strength, increased muscle spasms, impaired flexibility, improper body mechanics, postural dysfunction, and pain.   ACTIVITY LIMITATIONS carrying, lifting, sitting, standing, reach over head, hygiene/grooming, and locomotion level  PARTICIPATION LIMITATIONS: meal prep, cleaning, laundry, driving, community activity, and yard work  PERSONAL FACTORS Past/current experiences, Time since onset of injury/illness/exacerbation, and 3+ comorbidities: fibromyalgia depression, anxiety,   are also affecting patient's functional outcome.   REHAB POTENTIAL: Good  CLINICAL DECISION MAKING: Evolving/moderate complexity  EVALUATION COMPLEXITY: Moderate   GOALS: Goals reviewed with patient? Yes  SHORT TERM GOALS: Target date: 03/20/22  Return to regular performance of HEP 2-3x/day to improve mobility  Baseline: stretching 2x/day (02/28/22) Goal status: In progress  2.  Report > or = to 25% reduction in cervical and lumbar pain with ADLs and self-care  Baseline: 20% better overall (02/28/2022) Goal status: in progress   3.  Reduce fibromyalgia impact score to < or = to 39  Baseline: 28 (02/28/22) Goal status: In progress     LONG TERM GOALS: Target date: 04/17/22  Be independent in advanced HEP Baseline: gentle exercise  Goal status:  In progress   2.  Reduce fibromyalgia impact questionnaire to < or = to 21 Baseline: 28 (02/28/22) Goal status: Revised   3.  Lift and carry laundry and her grandchildren with > or = to 50% reduction in pain Baseline: 20% better (02/28/22) Goal status: In progress   4.  Report > or = to 50% reduction in neck and lumbar pain with ADLs and self-care  Baseline: 20% better (02/28/22) Goal status: In progress   5.  Return to yardwork verbalize appropriate rest breaks and mechanics  Baseline: hasn't been doing due to being sick (02/28/22) Goal status: In progress    PLAN: PT FREQUENCY: 2x/week  PT DURATION: 8 weeks  PLANNED INTERVENTIONS: Therapeutic exercises, Therapeutic activity, Neuromuscular re-education, Balance training, Gait training, Patient/Family education, Self Care, Joint mobilization, Aquatic Therapy, Dry Needling, Spinal manipulation, Spinal mobilization, Cryotherapy, Moist heat, Taping, Traction, Manual therapy, and Re-evaluation  PLAN FOR NEXT SESSION:  manual as needed, manage exercises as needed. Encourage gentle mobility. Aquatics to manage chronic pain.  Myrene Galas, PTA 03/15/22 8:27 AM   [facility] [address], [place], [place] [address] Phone # [phone] Fax [phone]

## 2022-03-16 DIAGNOSIS — E8809 Other disorders of plasma-protein metabolism, not elsewhere classified: Secondary | ICD-10-CM | POA: Diagnosis not present

## 2022-03-16 DIAGNOSIS — C9000 Multiple myeloma not having achieved remission: Secondary | ICD-10-CM | POA: Diagnosis not present

## 2022-03-16 DIAGNOSIS — D472 Monoclonal gammopathy: Secondary | ICD-10-CM | POA: Diagnosis not present

## 2022-03-17 ENCOUNTER — Ambulatory Visit: Payer: Medicare Other | Admitting: Psychology

## 2022-03-21 ENCOUNTER — Ambulatory Visit: Payer: Medicare Other

## 2022-03-21 ENCOUNTER — Ambulatory Visit: Payer: Medicare Other | Admitting: Psychology

## 2022-03-21 DIAGNOSIS — R293 Abnormal posture: Secondary | ICD-10-CM

## 2022-03-21 DIAGNOSIS — R252 Cramp and spasm: Secondary | ICD-10-CM

## 2022-03-21 DIAGNOSIS — M353 Polymyalgia rheumatica: Secondary | ICD-10-CM | POA: Diagnosis not present

## 2022-03-21 DIAGNOSIS — M6281 Muscle weakness (generalized): Secondary | ICD-10-CM

## 2022-03-21 DIAGNOSIS — M542 Cervicalgia: Secondary | ICD-10-CM

## 2022-03-21 NOTE — Therapy (Signed)
OUTPATIENT PHYSICAL THERAPY TREATMENT   Patient Name: Danielle Bruce MRN: [id number] DOB:08/10/1960, 62 y.o., female Today's Date: 03/21/2022        PT End of Session - 03/21/22 1229     Visit Number 24    Date for PT Re-Evaluation 04/17/22    Authorization Type Medicare B- needs KX  at 48 for 2024    Progress Note Due on Visit 29    PT Start Time 1152    PT Stop Time 1229    PT Time Calculation (min) 37 min    Activity Tolerance Patient tolerated treatment well    Behavior During Therapy [facility] for tasks assessed/performed                             Past Medical History:  Diagnosis Date   Abdominal pain, unspecified site 10/18/2012   Anemia    Anxiety    Arthritis    osteoarthritis   ASCUS (atypical squamous cells of undetermined significance) on Pap smear 05/06/2005   NEG HIGH RISK HPV--C&B BIOPSY BENIGN 12/2005   Asthma    Bipolar 2 disorder ([place])    Cancer ([place])    skin cancer - basal cell   Colon polyps    Complication of anesthesia    anxious afterwards, will get headaches    Constipation    Depression    Fibromyalgia 10/2013   GERD (gastroesophageal reflux disease)    Hearing loss on left    Heart murmur    never had any problems   Hemorrhoids    High cholesterol    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   cytology negative   IBS (irritable bowel syndrome)    Insomnia    Lymphocytic colitis    Lymphoma (HCC)    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   Bone marrow biopsy showes 8% plasma cells IgA Lambda   Migraines    Nausea alone 10/18/2012   Osteoarthritis    Osteopenia    Peripheral neuropathy    PTSD (post-traumatic stress disorder)    Past Surgical History:  Procedure Laterality Date   BONE MARROW BIOPSY Left 12/18/2013   Plasma cell dyscrasia 8% population of plasma cells   BREAST BIOPSY Right    benign stereo   CESAREAN SECTION  93,23,55   CHOLECYSTECTOMY N/A 10/16/2012   Procedure: LAPAROSCOPIC  CHOLECYSTECTOMY;  Surgeon: Joyice Faster. Cornett, MD;  Location: [facility];  Service: General;  Laterality: N/A;   COLONOSCOPY     numerous times   DILATION AND CURETTAGE OF UTERUS     ESOPHAGOGASTRODUODENOSCOPY     HEMORRHOID SURGERY  1993   x3   IUD REMOVAL  02/2015   Mirena   LAPAROSCOPIC LYSIS OF ADHESIONS N/A 10/16/2012   Procedure: LAPAROSCOPIC LYSIS OF ADHESIONS;  Surgeon: Marcello Moores A. Cornett, MD;  Location: [facility];  Service: General;  Laterality: N/A;   LAPAROSCOPY N/A 10/16/2012   Procedure: LAPAROSCOPY DIAGNOSTIC;  Surgeon: Joyice Faster. Cornett, MD;  Location[facility];  Service: General;  Laterality: N/A;   PELVIC LAPAROSCOPY     RADIOLOGY WITH ANESTHESIA N/A 12/16/2015   Procedure: MRI OF BRAIN WITH AND WITHOUT CONTRAST;  Surgeon: Medication Radiologist, MD;  Location: [place];  Service: Radiology;  Laterality: N/A;   SHOULDER SURGERY  2007/2008   SPINE SURGERY  2010   cervical   Patient Active Problem List   Diagnosis Date Noted   GAD (generalized anxiety disorder) 12/26/2017  OCD (obsessive compulsive disorder) 12/26/2017   PTSD (post-traumatic stress disorder) 12/26/2017   DDD (degenerative disc disease), cervical 07/14/2016   Primary osteoarthritis of both feet 07/14/2016   Primary osteoarthritis of both hands 07/14/2016   Other fatigue 07/14/2016   History of IBS 07/14/2016   Osteopenia of multiple sites 07/14/2016   Fibromyalgia 01/13/2016   MGUS (monoclonal gammopathy of unknown significance) 12/23/2013   Chronic cholecystitis without calculus 10/18/2012   Abdominal pain, unspecified site 10/18/2012   Nausea alone 10/18/2012   Unspecified constipation 10/18/2012   Depression 10/18/2012   Anxiety    ASCUS (atypical squamous cells of undetermined significance) on Pap smear    IUD    Hemorrhoids 11/29/2010   Abdominal pain, left upper quadrant 11/29/2010    PCP:  Mayra Neer, MD  REFERRING PROVIDER: Mayra Neer, MD  REFERRING DIAG: fibromyalgia  THERAPY DIAG:   Abnormal posture  Muscle weakness (generalized)  Cervicalgia  Polymyalgia rheumatica ([place])  Cramp and spasm  Rationale for Evaluation and Treatment Rehabilitation  ONSET DATE: chronic pain (fibromyalgia) with flare-up 2 months ago  SUBJECTIVE:                                                                                                                                                                                                         SUBJECTIVE STATEMENT: My soreness is mostly in my neck today.  I was helping with my dad so I must have strained it.  I haven't been able to exercise, my GI system is flared up.    PERTINENT HISTORY:  Anxiety, bipolar disorder, depression, fibromyalgia, IBS, migraines, osteopenia, PTSD  PAIN:  Are you having pain?Yes 5/10 Pain location: neck Pain description: sore, tight  Aggravating factors: activity, yard work, picking up heavy objects Relieving factors: muscle relaxers, rest, stretching  PRECAUTIONS: Other: chronic pain syndrome and depression Other: slow progression with exercise due to chronic condition  WEIGHT BEARING RESTRICTIONS No  FALLS:  Has patient fallen in last 6 months? No  LIVING ENVIRONMENT: Lives with: lives with their family Lives in: House/apartment  OCCUPATION: on disability  PLOF: Independent with basic ADLs and Leisure: yardwork, walking  Pt cares for her 54 young grandchildren- difficulty with care including lifting and carrying  PATIENT GOALS be more active with less pain, exercise at the gym regularly, lift and carry grandchildren and laundry, sleep with fewer interruptions, get back to more gardening.  OBJECTIVE:   DIAGNOSTIC FINDINGS: none recent   PATIENT SURVEYS: Eval: Fibromyalgia Impact Questionnaire: first 2 sections only: 49 12/26/21: 40  02/28/22: 28 (first 2  sections only)  COGNITION: Overall cognitive status: Within functional limits for tasks assessed  SENSATION: WFL  POSTURE:  rounded shoulders and forward head  PALPATION: Diffuse palpable tenderness over bil neck, upper traps, thoracic, lumbar and gluteals with trigger points.    CERVICAL ROM:   Active ROM A/PROM (deg) eval A/ROM 12/26/21  Flexion 55   Extension 40   Right lateral flexion 30 35  Left lateral flexion 35 40  Right rotation 50 60  Left rotation 40 65   (Blank rows = not tested)  UPPER EXTREMITY ROM: UE A/ROM is limited by 20% into flexion and abduction with pain at end range.  Hip flexibility is limited by 25% in all directions with pain in all directions.  UPPER EXTREMITY MMT: UE: 4+/5, LE 4+/5 except hip flexors 4-/5  TODAY'S TREATMENT:  03/21/22: Arm Bike: Level 1x 6 min (3/3)-PT present to discuss progress  Seated hamstring stretch 3x20 seconds  Seated figure 4 2x20 seconds  Sit to stand with 5# kettlebell 2x10 Standing rows and extension: red band 1x10 Ball roll outs: forward and lateral x5 each Manual:  Elongation and release to bil neck and upper traps    03/14/22: Arm Bike: Level 1x 6 min (3/3)-PT present to discuss progress  Seated hamstring stretch 3x20 seconds  Seated figure 4 2x20 seconds  Supine TA activation: ball squeeze 5" hold x10 Hip abduction with blue band: TA activation blue band 2x10 Manual:  Trigger Point Dry-Needling  Treatment instructions: Expect mild to moderate muscle soreness. S/S of pneumothorax if dry needled over a lung field, and to seek immediate medical attention should they occur. Patient verbalized understanding of these instructions and education.  Patient Consent Given: Yes Education handout provided: Previously provided Muscles treated: bil lumbar multifidi and gluteals Treatment response/outcome: Utilized skilled palpation to identify trigger points.  During dry needling able to palpate muscle twitch and muscle elongation  Elongation and release to low back after DN Skilled palpation and monitoring by PT during dry needling    03/08/22:Pt arrives for aquatic physical therapy. Treatment took place in 3.5-5.5 feet of water. Water temperature was 88 degrees f.. Pt entered the pool via stairs with mild use of the rails. Pt requires buoyancy of water for support and to offload joints with strengthening exercises.  Seated water bench with 75% submersion Pt performed seated LE AROM exercises 20x in all planes, concurrent discussion of current status. 75% depth water walking with UE buoy weights reciprocally 10x in each direction, high knee marching 20x, holding water bells by side in tandem stance: UE 10x saggital plane Bil. Hip 3 ways 15x each Bil trying not to hold on for balance but requires intermittent UE dabs. Standing at wall core press with double buoy UE weight 2x10 followed by sitting on yellow noodle for core strength 5 min then 56mn decompression float and bicycle in horseback.    PATIENT EDUCATION:  Education details: Access Code: [email or web address](review from previous session). Discussed strategies to incorporate more movement into her day considering her depression.  Pt will work to implement these strategies.   Person educated: Patient Education method: Explanation, Demonstration, and Handouts Education comprehension: verbalized understanding and returned demonstration   HOME EXERCISE PROGRAM: Access Code: [email or web address]  ASSESSMENT:  CLINICAL IMPRESSION: Pt with increased neck pain today and reports intestinal symptoms over the past week.   Low back is feeling better after last session.  Pt did well with advancement of exercises in the clinic today and required minor tactile and verbal  cues for scapular relaxation. She had trigger points in her neck and upper traps and had good response to manual therapy today.   Patient will benefit from skilled PT to address the below impairments and improve overall function.   OBJECTIVE IMPAIRMENTS decreased activity tolerance, decreased endurance, difficulty walking, decreased  strength, increased muscle spasms, impaired flexibility, improper body mechanics, postural dysfunction, and pain.   ACTIVITY LIMITATIONS carrying, lifting, sitting, standing, reach over head, hygiene/grooming, and locomotion level  PARTICIPATION LIMITATIONS: meal prep, cleaning, laundry, driving, community activity, and yard work  PERSONAL FACTORS Past/current experiences, Time since onset of injury/illness/exacerbation, and 3+ comorbidities: fibromyalgia depression, anxiety,   are also affecting patient's functional outcome.   REHAB POTENTIAL: Good  CLINICAL DECISION MAKING: Evolving/moderate complexity  EVALUATION COMPLEXITY: Moderate   GOALS: Goals reviewed with patient? Yes  SHORT TERM GOALS: Target date: 03/20/22  Return to regular performance of HEP 2-3x/day to improve mobility  Baseline: stretching 2x/day (02/28/22) Goal status: In progress  2.  Report > or = to 25% reduction in cervical and lumbar pain with ADLs and self-care  Baseline: 20% better overall (02/28/2022) Goal status: in progress   3.  Reduce fibromyalgia impact score to < or = to 39  Baseline: 28 (02/28/22) Goal status: MET    LONG TERM GOALS: Target date: 04/17/22  Be independent in advanced HEP Baseline: gentle exercise  Goal status: In progress   2.  Reduce fibromyalgia impact questionnaire to < or = to 21 Baseline: 28 (02/28/22) Goal status: Revised   3.  Lift and carry laundry and her grandchildren with > or = to 50% reduction in pain Baseline: 20% better (02/28/22) Goal status: In progress   4.  Report > or = to 50% reduction in neck and lumbar pain with ADLs and self-care  Baseline: 20% better (02/28/22) Goal status: In progress   5.  Return to yardwork verbalize appropriate rest breaks and mechanics  Baseline: hasn't been doing due to being sick (02/28/22) Goal status: In progress    PLAN: PT FREQUENCY: 2x/week  PT DURATION: 8 weeks  PLANNED INTERVENTIONS: Therapeutic exercises, Therapeutic  activity, Neuromuscular re-education, Balance training, Gait training, Patient/Family education, Self Care, Joint mobilization, Aquatic Therapy, Dry Needling, Spinal manipulation, Spinal mobilization, Cryotherapy, Moist heat, Taping, Traction, Manual therapy, and Re-evaluation  PLAN FOR NEXT SESSION:  manual as needed, manage exercises as needed. Encourage gentle mobility. Aquatics to manage chronic pain.  Sigurd Sos, PT 03/21/22 12:31 PM   [facility] [address] [place], [place] [address] Phone # [phone] Fax [phone]

## 2022-03-22 ENCOUNTER — Encounter: Payer: Self-pay | Admitting: Physical Therapy

## 2022-03-22 ENCOUNTER — Ambulatory Visit: Payer: Medicare Other | Admitting: Physical Therapy

## 2022-03-22 DIAGNOSIS — R293 Abnormal posture: Secondary | ICD-10-CM | POA: Diagnosis not present

## 2022-03-22 DIAGNOSIS — M542 Cervicalgia: Secondary | ICD-10-CM

## 2022-03-22 DIAGNOSIS — M6281 Muscle weakness (generalized): Secondary | ICD-10-CM | POA: Diagnosis not present

## 2022-03-22 DIAGNOSIS — M353 Polymyalgia rheumatica: Secondary | ICD-10-CM

## 2022-03-22 DIAGNOSIS — R252 Cramp and spasm: Secondary | ICD-10-CM | POA: Diagnosis not present

## 2022-03-22 NOTE — Therapy (Signed)
OUTPATIENT PHYSICAL THERAPY TREATMENT   Patient Name: Danielle Bruce MRN: [id number] DOB:[birth year], 62 y.o., female Today's Date: 03/22/2022        PT End of Session - 03/22/22 2056     Visit Number 25    Date for PT Re-Evaluation 04/17/22    Authorization Type Medicare B- needs KX  at 50 for 2024    Progress Note Due on Visit 29    PT Start Time 1200   Pt 15 min late   PT Stop Time 1230    PT Time Calculation (min) 30 min    Activity Tolerance Patient limited by fatigue    Behavior During Therapy [facility] for tasks assessed/performed                             Past Medical History:  Diagnosis Date   Abdominal pain, unspecified site 10/18/2012   Anemia    Anxiety    Arthritis    osteoarthritis   ASCUS (atypical squamous cells of undetermined significance) on Pap smear 05/06/2005   NEG HIGH RISK HPV--C&B BIOPSY BENIGN 12/2005   Asthma    Bipolar 2 disorder ([place])    Cancer ([place])    skin cancer - basal cell   Colon polyps    Complication of anesthesia    anxious afterwards, will get headaches    Constipation    Depression    Fibromyalgia 10/2013   GERD (gastroesophageal reflux disease)    Hearing loss on left    Heart murmur    never had any problems   Hemorrhoids    High cholesterol    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   cytology negative   IBS (irritable bowel syndrome)    Insomnia    Lymphocytic colitis    Lymphoma ([facility])    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   Bone marrow biopsy showes 8% plasma cells IgA Lambda   Migraines    Nausea alone 10/18/2012   Osteoarthritis    Osteopenia    Peripheral neuropathy    PTSD (post-traumatic stress disorder)    Past Surgical History:  Procedure Laterality Date   BONE MARROW BIOPSY Left 12/18/2013   Plasma cell dyscrasia 8% population of plasma cells   BREAST BIOPSY Right    benign stereo   CESAREAN SECTION  00,93,81   CHOLECYSTECTOMY N/A 10/16/2012   Procedure:  LAPAROSCOPIC CHOLECYSTECTOMY;  Surgeon: Joyice Faster. Cornett, MD;  Location: [facility];  Service: General;  Laterality: N/A;   COLONOSCOPY     numerous times   DILATION AND CURETTAGE OF UTERUS     ESOPHAGOGASTRODUODENOSCOPY     HEMORRHOID SURGERY  1993   x3   IUD REMOVAL  02/2015   Mirena   LAPAROSCOPIC LYSIS OF ADHESIONS N/A 10/16/2012   Procedure: LAPAROSCOPIC LYSIS OF ADHESIONS;  Surgeon: Marcello Moores A. Cornett, MD;  Location: [facility];  Service: General;  Laterality: N/A;   LAPAROSCOPY N/A 10/16/2012   Procedure: LAPAROSCOPY DIAGNOSTIC;  Surgeon: Joyice Faster. Cornett, MD;  Location: [facility];  Service: General;  Laterality: N/A;   PELVIC LAPAROSCOPY     RADIOLOGY WITH ANESTHESIA N/A 12/16/2015   Procedure: MRI OF BRAIN WITH AND WITHOUT CONTRAST;  Surgeon: Medication Radiologist, MD;  Location: [place];  Service: Radiology;  Laterality: N/A;   SHOULDER SURGERY  2007/2008   SPINE SURGERY  2010   cervical   Patient Active Problem List   Diagnosis Date Noted   GAD (  generalized anxiety disorder) 12/26/2017   OCD (obsessive compulsive disorder) 12/26/2017   PTSD (post-traumatic stress disorder) 12/26/2017   DDD (degenerative disc disease), cervical 07/14/2016   Primary osteoarthritis of both feet 07/14/2016   Primary osteoarthritis of both hands 07/14/2016   Other fatigue 07/14/2016   History of IBS 07/14/2016   Osteopenia of multiple sites 07/14/2016   Fibromyalgia 01/13/2016   MGUS (monoclonal gammopathy of unknown significance) 12/23/2013   Chronic cholecystitis without calculus 10/18/2012   Abdominal pain, unspecified site 10/18/2012   Nausea alone 10/18/2012   Unspecified constipation 10/18/2012   Depression 10/18/2012   Anxiety    ASCUS (atypical squamous cells of undetermined significance) on Pap smear    IUD    Hemorrhoids 11/29/2010   Abdominal pain, left upper quadrant 11/29/2010    PCP:  Mayra Neer, MD  REFERRING PROVIDER: Mayra Neer, MD  REFERRING DIAG:  fibromyalgia  THERAPY DIAG:  Abnormal posture  Muscle weakness (generalized)  Cervicalgia  Polymyalgia rheumatica ([place])  Rationale for Evaluation and Treatment Rehabilitation  ONSET DATE: chronic pain (fibromyalgia) with flare-up 2 months ago  SUBJECTIVE:                                                                                                                                                                                                         SUBJECTIVE STATEMENT: I am sorry I am late, my Dad is at end of life and I am not sleeping well and I hurt all over.     PERTINENT HISTORY:  Anxiety, bipolar disorder, depression, fibromyalgia, IBS, migraines, osteopenia, PTSD  PAIN:  Are you having pain?Yes 6/10 Pain location: whole body, stress, fatigue Pain description: sore, tight  Aggravating factors: activity, yard work, picking up heavy objects Relieving factors: muscle relaxers, rest, stretching  PRECAUTIONS: Other: chronic pain syndrome and depression Other: slow progression with exercise due to chronic condition  WEIGHT BEARING RESTRICTIONS No  FALLS:  Has patient fallen in last 6 months? No  LIVING ENVIRONMENT: Lives with: lives with their family Lives in: House/apartment  OCCUPATION: on disability  PLOF: Independent with basic ADLs and Leisure: yardwork, walking  Pt cares for her 39 young grandchildren- difficulty with care including lifting and carrying  PATIENT GOALS be more active with less pain, exercise at the gym regularly, lift and carry grandchildren and laundry, sleep with fewer interruptions, get back to more gardening.  OBJECTIVE:   DIAGNOSTIC FINDINGS: none recent   PATIENT SURVEYS: Eval: Fibromyalgia Impact Questionnaire: first 2 sections only: 49 12/26/21: 40  02/28/22: 28 (first 2 sections only)  COGNITION:  Overall cognitive status: Within functional limits for tasks assessed  SENSATION: WFL  POSTURE: rounded shoulders and forward  head  PALPATION: Diffuse palpable tenderness over bil neck, upper traps, thoracic, lumbar and gluteals with trigger points.    CERVICAL ROM:   Active ROM A/PROM (deg) eval A/ROM 12/26/21  Flexion 55   Extension 40   Right lateral flexion 30 35  Left lateral flexion 35 40  Right rotation 50 60  Left rotation 40 65   (Blank rows = not tested)  UPPER EXTREMITY ROM: UE A/ROM is limited by 20% into flexion and abduction with pain at end range.  Hip flexibility is limited by 25% in all directions with pain in all directions.  UPPER EXTREMITY MMT: UE: 4+/5, LE 4+/5 except hip flexors 4-/5  TODAY'S TREATMENT:   03/22/22: Pt arrives for aquatic physical therapy. Treatment took place in 3.5-5.5 feet of water. Water temperature was 91 degrees F. Pt entered the pool via stairs reciprocally with mild use of rails. Pt requires buoyancy of water for support and to offload joints with strengthening exercises. Seated water bench with 75% submersion Pt performed seated LE AROM exercises 20x in all planes,   Horizontal decompression float with 100% flotation and PTA providing lateral sway to mobilize trunk to down regulate her CNS and reduce pain.   03/21/22: Arm Bike: Level 1x 6 min (3/3)-PT present to discuss progress  Seated hamstring stretch 3x20 seconds  Seated figure 4 2x20 seconds  Sit to stand with 5# kettlebell 2x10 Standing rows and extension: red band 1x10 Ball roll outs: forward and lateral x5 each Manual:  Elongation and release to bil neck and upper traps    03/14/22: Arm Bike: Level 1x 6 min (3/3)-PT present to discuss progress  Seated hamstring stretch 3x20 seconds  Seated figure 4 2x20 seconds  Supine TA activation: ball squeeze 5" hold x10 Hip abduction with blue band: TA activation blue band 2x10 Manual:  Trigger Point Dry-Needling  Treatment instructions: Expect mild to moderate muscle soreness. S/S of pneumothorax if dry needled over a lung field, and to seek  immediate medical attention should they occur. Patient verbalized understanding of these instructions and education.  Patient Consent Given: Yes Education handout provided: Previously provided Muscles treated: bil lumbar multifidi and gluteals Treatment response/outcome: Utilized skilled palpation to identify trigger points.  During dry needling able to palpate muscle twitch and muscle elongation  Elongation and release to low back after DN Skilled palpation and monitoring by PT during dry needling    PATIENT EDUCATION:  Education details: Access Code: [email or web address] (review from previous session). Discussed strategies to incorporate more movement into her day considering her depression.  Pt will work to implement these strategies.   Person educated: Patient Education method: Explanation, Demonstration, and Handouts Education comprehension: verbalized understanding and returned demonstration   HOME EXERCISE PROGRAM: Access Code: [email or web address]  ASSESSMENT:  CLINICAL IMPRESSION: Pt arrives to aquatic PT very stressed, fatigued and with increased whole body pain. Pt verbalizes her Dad is at end of life and that has brought a great challenge to her life. Pt was put in the horizontal decompression position with 100% floatation to help ultimately reduce her sense of pain and induce relaxation. This was helpful as pt did report improvement at end of session.   OBJECTIVE IMPAIRMENTS decreased activity tolerance, decreased endurance, difficulty walking, decreased strength, increased muscle spasms, impaired flexibility, improper body mechanics, postural dysfunction, and pain.   ACTIVITY LIMITATIONS carrying, lifting, sitting, standing, reach over head, hygiene/grooming,  and locomotion level  PARTICIPATION LIMITATIONS: meal prep, cleaning, laundry, driving, community activity, and yard work  PERSONAL FACTORS Past/current experiences, Time since onset of injury/illness/exacerbation, and 3+ comorbidities:  fibromyalgia depression, anxiety,   are also affecting patient's functional outcome.   REHAB POTENTIAL: Good  CLINICAL DECISION MAKING: Evolving/moderate complexity  EVALUATION COMPLEXITY: Moderate   GOALS: Goals reviewed with patient? Yes  SHORT TERM GOALS: Target date: 03/20/22  Return to regular performance of HEP 2-3x/day to improve mobility  Baseline: stretching 2x/day (02/28/22) Goal status: In progress  2.  Report > or = to 25% reduction in cervical and lumbar pain with ADLs and self-care  Baseline: 20% better overall (02/28/2022) Goal status: in progress   3.  Reduce fibromyalgia impact score to < or = to 39  Baseline: 28 (02/28/22) Goal status: MET    LONG TERM GOALS: Target date: 04/17/22  Be independent in advanced HEP Baseline: gentle exercise  Goal status: In progress   2.  Reduce fibromyalgia impact questionnaire to < or = to 21 Baseline: 28 (02/28/22) Goal status: Revised   3.  Lift and carry laundry and her grandchildren with > or = to 50% reduction in pain Baseline: 20% better (02/28/22) Goal status: In progress   4.  Report > or = to 50% reduction in neck and lumbar pain with ADLs and self-care  Baseline: 20% better (02/28/22) Goal status: In progress   5.  Return to yardwork verbalize appropriate rest breaks and mechanics  Baseline: hasn't been doing due to being sick (02/28/22) Goal status: In progress    PLAN: PT FREQUENCY: 2x/week  PT DURATION: 8 weeks  PLANNED INTERVENTIONS: Therapeutic exercises, Therapeutic activity, Neuromuscular re-education, Balance training, Gait training, Patient/Family education, Self Care, Joint mobilization, Aquatic Therapy, Dry Needling, Spinal manipulation, Spinal mobilization, Cryotherapy, Moist heat, Taping, Traction, Manual therapy, and Re-evaluation  PLAN FOR NEXT SESSION:  manual as needed, manage exercises as needed. Encourage gentle mobility. Aquatics to manage chronic pain.  Myrene Galas, PTA 03/22/22 8:58 PM    [facility] [address], [address] [place], [place] [address] Phone # [phone] Fax [phone]

## 2022-03-27 ENCOUNTER — Ambulatory Visit: Payer: Medicare Other

## 2022-03-28 ENCOUNTER — Ambulatory Visit: Payer: Medicare Other | Admitting: Psychology

## 2022-03-29 ENCOUNTER — Ambulatory Visit: Payer: Medicare Other | Admitting: Physical Therapy

## 2022-04-03 ENCOUNTER — Ambulatory Visit: Payer: Medicare Other | Attending: Family Medicine

## 2022-04-03 DIAGNOSIS — R293 Abnormal posture: Secondary | ICD-10-CM | POA: Diagnosis not present

## 2022-04-03 DIAGNOSIS — R252 Cramp and spasm: Secondary | ICD-10-CM | POA: Diagnosis not present

## 2022-04-03 DIAGNOSIS — M542 Cervicalgia: Secondary | ICD-10-CM | POA: Diagnosis not present

## 2022-04-03 DIAGNOSIS — M353 Polymyalgia rheumatica: Secondary | ICD-10-CM | POA: Insufficient documentation

## 2022-04-03 DIAGNOSIS — M6281 Muscle weakness (generalized): Secondary | ICD-10-CM | POA: Diagnosis not present

## 2022-04-03 NOTE — Therapy (Signed)
OUTPATIENT PHYSICAL THERAPY TREATMENT   Patient Name: Danielle Bruce MRN: [id number] DOB:14-Feb-1961, 62 y.o., female Today's Date: 04/03/2022        PT End of Session - 04/03/22 1229     Visit Number 26    Date for PT Re-Evaluation 04/17/22    Authorization Type Medicare B- needs KX  at 46 for 2024 (8 for 2024 on 04/23/22)    Progress Note Due on Visit 29    PT Start Time 1148    PT Stop Time 1232    PT Time Calculation (min) 44 min    Activity Tolerance Patient tolerated treatment well    Behavior During Therapy [facility] for tasks assessed/performed                              Past Medical History:  Diagnosis Date   Abdominal pain, unspecified site 10/18/2012   Anemia    Anxiety    Arthritis    osteoarthritis   ASCUS (atypical squamous cells of undetermined significance) on Pap smear 05/06/2005   NEG HIGH RISK HPV--C&B BIOPSY BENIGN 12/2005   Asthma    Bipolar 2 disorder ([place])    Cancer ([place])    skin cancer - basal cell   Colon polyps    Complication of anesthesia    anxious afterwards, will get headaches    Constipation    Depression    Fibromyalgia 10/2013   GERD (gastroesophageal reflux disease)    Hearing loss on left    Heart murmur    never had any problems   Hemorrhoids    High cholesterol    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   cytology negative   IBS (irritable bowel syndrome)    Insomnia    Lymphocytic colitis    Lymphoma ([facility])    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   Bone marrow biopsy showes 8% plasma cells IgA Lambda   Migraines    Nausea alone 10/18/2012   Osteoarthritis    Osteopenia    Peripheral neuropathy    PTSD (post-traumatic stress disorder)    Past Surgical History:  Procedure Laterality Date   BONE MARROW BIOPSY Left 12/18/2013   Plasma cell dyscrasia 8% population of plasma cells   BREAST BIOPSY Right    benign stereo   CESAREAN SECTION  37,10,62   CHOLECYSTECTOMY N/A 10/16/2012    Procedure: LAPAROSCOPIC CHOLECYSTECTOMY;  Surgeon: Joyice Faster. Cornett, MD;  Location: [facility];  Service: General;  Laterality: N/A;   COLONOSCOPY     numerous times   DILATION AND CURETTAGE OF UTERUS     ESOPHAGOGASTRODUODENOSCOPY     HEMORRHOID SURGERY  1993   x3   IUD REMOVAL  02/2015   Mirena   LAPAROSCOPIC LYSIS OF ADHESIONS N/A 10/16/2012   Procedure: LAPAROSCOPIC LYSIS OF ADHESIONS;  Surgeon: Marcello Moores A. Cornett, MD;  Location: [facility];  Service: General;  Laterality: N/A;   LAPAROSCOPY N/A 10/16/2012   Procedure: LAPAROSCOPY DIAGNOSTIC;  Surgeon: Joyice Faster. Cornett, MD;  Location: [facility];  Service: General;  Laterality: N/A;   PELVIC LAPAROSCOPY     RADIOLOGY WITH ANESTHESIA N/A 12/16/2015   Procedure: MRI OF BRAIN WITH AND WITHOUT CONTRAST;  Surgeon: Medication Radiologist, MD;  Location: [place];  Service: Radiology;  Laterality: N/A;   SHOULDER SURGERY  2007/2008   SPINE SURGERY  2010   cervical   Patient Active Problem List   Diagnosis Date Noted  GAD (generalized anxiety disorder) 12/26/2017   OCD (obsessive compulsive disorder) 12/26/2017   PTSD (post-traumatic stress disorder) 12/26/2017   DDD (degenerative disc disease), cervical 07/14/2016   Primary osteoarthritis of both feet 07/14/2016   Primary osteoarthritis of both hands 07/14/2016   Other fatigue 07/14/2016   History of IBS 07/14/2016   Osteopenia of multiple sites 07/14/2016   Fibromyalgia 01/13/2016   MGUS (monoclonal gammopathy of unknown significance) 12/23/2013   Chronic cholecystitis without calculus 10/18/2012   Abdominal pain, unspecified site 10/18/2012   Nausea alone 10/18/2012   Unspecified constipation 10/18/2012   Depression 10/18/2012   Anxiety    ASCUS (atypical squamous cells of undetermined significance) on Pap smear    IUD    Hemorrhoids 11/29/2010   Abdominal pain, left upper quadrant 11/29/2010    PCP:  Mayra Neer, MD  REFERRING PROVIDER: Mayra Neer, MD  REFERRING DIAG:  fibromyalgia  THERAPY DIAG:  Abnormal posture  Muscle weakness (generalized)  Cervicalgia  Polymyalgia rheumatica ([place])  Cramp and spasm  Rationale for Evaluation and Treatment Rehabilitation  ONSET DATE: chronic pain (fibromyalgia) with flare-up 2 months ago  SUBJECTIVE:                                                                                                                                                                                                         SUBJECTIVE STATEMENT: My dad passed away so this week has been crazy.    PERTINENT HISTORY:  Anxiety, bipolar disorder, depression, fibromyalgia, IBS, migraines, osteopenia, PTSD  PAIN:  Are you having pain?Yes 5/10 Pain location: whole body, stress, fatigue Pain description: sore, tight  Aggravating factors: activity, yard work, picking up heavy objects Relieving factors: muscle relaxers, rest, stretching  PRECAUTIONS: Other: chronic pain syndrome and depression Other: slow progression with exercise due to chronic condition  WEIGHT BEARING RESTRICTIONS No  FALLS:  Has patient fallen in last 6 months? No  LIVING ENVIRONMENT: Lives with: lives with their family Lives in: House/apartment  OCCUPATION: on disability  PLOF: Independent with basic ADLs and Leisure: yardwork, walking  Pt cares for her 66 young grandchildren- difficulty with care including lifting and carrying  PATIENT GOALS be more active with less pain, exercise at the gym regularly, lift and carry grandchildren and laundry, sleep with fewer interruptions, get back to more gardening.  OBJECTIVE:   DIAGNOSTIC FINDINGS: none recent   PATIENT SURVEYS: Eval: Fibromyalgia Impact Questionnaire: first 2 sections only: 49 12/26/21: 40  02/28/22: 28 (first 2 sections only)  COGNITION: Overall cognitive status: Within functional limits for tasks assessed  SENSATION: WFL  POSTURE: rounded shoulders and forward head  PALPATION: Diffuse  palpable tenderness over bil neck, upper traps, thoracic, lumbar and gluteals with trigger points.    CERVICAL ROM:   Active ROM A/PROM (deg) eval A/ROM 12/26/21  Flexion 55   Extension 40   Right lateral flexion 30 35  Left lateral flexion 35 40  Right rotation 50 60  Left rotation 40 65   (Blank rows = not tested)  UPPER EXTREMITY ROM: UE A/ROM is limited by 20% into flexion and abduction with pain at end range.  Hip flexibility is limited by 25% in all directions with pain in all directions.  UPPER EXTREMITY MMT: UE: 4+/5, LE 4+/5 except hip flexors 4-/5  TODAY'S TREATMENT:  04/03/22: NuStep: Level 3x 6 minutes-PT present to discuss progress Seated hamstring stretch 3x20 seconds  Seated figure 4 2x20 seconds  Sit to stand with 5# kettlebell 2x10 Seated flexion 1# 2x10 Ball roll outs: forward and lateral x5 each Manual:  Elongation and release to bil neck and upper traps   Trigger Point Dry-Needling  Treatment instructions: Expect mild to moderate muscle soreness. S/S of pneumothorax if dry needled over a lung field, and to seek immediate medical attention should they occur. Patient verbalized understanding of these instructions and education.  Patient Consent Given: Yes Education handout provided: Previously provided Muscles treated: bil lumbar multifidi thoracic multifidi Treatment response/outcome: Utilized skilled palpation to identify trigger points.  During dry needling able to palpate muscle twitch and muscle elongation  Elongation and release to low back after DN Skilled palpation and monitoring by PT during dry needling   03/22/22: Pt arrives for aquatic physical therapy. Treatment took place in 3.5-5.5 feet of water. Water temperature was 91 degrees F. Pt entered the pool via stairs reciprocally with mild use of rails. Pt requires buoyancy of water for support and to offload joints with strengthening exercises. Seated water bench with 75% submersion Pt performed  seated LE AROM exercises 20x in all planes,   Horizontal decompression float with 100% flotation and PTA providing lateral sway to mobilize trunk to down regulate her CNS and reduce pain.   03/21/22: Arm Bike: Level 1x 6 min (3/3)-PT present to discuss progress  Seated hamstring stretch 3x20 seconds  Seated figure 4 2x20 seconds  Sit to stand with 5# kettlebell 2x10 Standing rows and extension: red band 1x10 Ball roll outs: forward and lateral x5 each Manual:  Elongation and release to bil neck and upper traps   Trigger Point Dry-Needling  Treatment instructions: Expect mild to moderate muscle soreness. S/S of pneumothorax if dry needled over a lung field, and to seek immediate medical attention should they occur. Patient verbalized understanding of these instructions and education.  Patient Consent Given: Yes Education handout provided: Previously provided Muscles treated: bil lumbar multifidi and gluteals Treatment response/outcome: Utilized skilled palpation to identify trigger points.  During dry needling able to palpate muscle twitch and muscle elongation  Elongation and release to low back after DN Skilled palpation and monitoring by PT during dry needling      PATIENT EDUCATION:  Education details: Access Code: [email or web address] (review from previous session). Discussed strategies to incorporate more movement into her day considering her depression.  Pt will work to implement these strategies.   Person educated: Patient Education method: Explanation, Demonstration, and Handouts Education comprehension: verbalized understanding and returned demonstration   HOME EXERCISE PROGRAM: Access Code: [email or web address]  ASSESSMENT:  CLINICAL IMPRESSION: Pt's dad passed away last week and she  has been busy with family and funeral. Pt did well with exercise today and responded well to manual therapy.  Pt with tension and trigger points in neck and upper traps and responded well to manual therapy.    OBJECTIVE IMPAIRMENTS decreased activity tolerance, decreased endurance, difficulty walking, decreased strength, increased muscle spasms, impaired flexibility, improper body mechanics, postural dysfunction, and pain.   ACTIVITY LIMITATIONS carrying, lifting, sitting, standing, reach over head, hygiene/grooming, and locomotion level  PARTICIPATION LIMITATIONS: meal prep, cleaning, laundry, driving, community activity, and yard work  PERSONAL FACTORS Past/current experiences, Time since onset of injury/illness/exacerbation, and 3+ comorbidities: fibromyalgia depression, anxiety,   are also affecting patient's functional outcome.   REHAB POTENTIAL: Good  CLINICAL DECISION MAKING: Evolving/moderate complexity  EVALUATION COMPLEXITY: Moderate   GOALS: Goals reviewed with patient? Yes  SHORT TERM GOALS: Target date: 03/20/22  Return to regular performance of HEP 2-3x/day to improve mobility  Baseline: stretching 2x/day (02/28/22) Goal status: In progress  2.  Report > or = to 25% reduction in cervical and lumbar pain with ADLs and self-care  Baseline: 20% better overall (02/28/2022) Goal status: in progress   3.  Reduce fibromyalgia impact score to < or = to 39  Baseline: 28 (02/28/22) Goal status: MET    LONG TERM GOALS: Target date: 04/17/22  Be independent in advanced HEP Baseline: gentle exercise  Goal status: In progress   2.  Reduce fibromyalgia impact questionnaire to < or = to 21 Baseline: 28 (02/28/22) Goal status: Revised   3.  Lift and carry laundry and her grandchildren with > or = to 50% reduction in pain Baseline: 20% better (02/28/22) Goal status: In progress   4.  Report > or = to 50% reduction in neck and lumbar pain with ADLs and self-care  Baseline: 20% better (02/28/22) Goal status: In progress   5.  Return to yardwork verbalize appropriate rest breaks and mechanics  Baseline: hasn't been doing due to being sick (02/28/22) Goal status: In progress     PLAN: PT FREQUENCY: 2x/week  PT DURATION: 8 weeks  PLANNED INTERVENTIONS: Therapeutic exercises, Therapeutic activity, Neuromuscular re-education, Balance training, Gait training, Patient/Family education, Self Care, Joint mobilization, Aquatic Therapy, Dry Needling, Spinal manipulation, Spinal mobilization, Cryotherapy, Moist heat, Taping, Traction, Manual therapy, and Re-evaluation  PLAN FOR NEXT SESSION:  manual as needed, manage exercises as needed. Encourage gentle mobility. Aquatics to manage chronic pain.    Sigurd Sos, PT 04/03/22 12:33 PM   [facility] [address] [place], [place] [address] Phone # [phone] Fax [phone]

## 2022-04-05 ENCOUNTER — Encounter: Payer: Self-pay | Admitting: Physical Therapy

## 2022-04-05 ENCOUNTER — Ambulatory Visit: Payer: Medicare Other | Admitting: Physical Therapy

## 2022-04-05 DIAGNOSIS — M353 Polymyalgia rheumatica: Secondary | ICD-10-CM

## 2022-04-05 DIAGNOSIS — R252 Cramp and spasm: Secondary | ICD-10-CM | POA: Diagnosis not present

## 2022-04-05 DIAGNOSIS — R293 Abnormal posture: Secondary | ICD-10-CM

## 2022-04-05 DIAGNOSIS — M6281 Muscle weakness (generalized): Secondary | ICD-10-CM

## 2022-04-05 DIAGNOSIS — M542 Cervicalgia: Secondary | ICD-10-CM

## 2022-04-05 NOTE — Therapy (Signed)
OUTPATIENT PHYSICAL THERAPY TREATMENT   Patient Name: Danielle Bruce MRN: [id number] DOB:[birth year], 62 y.o., female Today's Date: 04/05/2022        PT End of Session - 04/05/22 1628     Visit Number 27    Date for PT Re-Evaluation 04/17/22    Authorization Type Medicare B- needs KX  at 77 for 2024 (8 for 2024 on 04/23/22)    Progress Note Due on Visit 29    PT Start Time 1143    PT Stop Time 1230    PT Time Calculation (min) 47 min    Activity Tolerance Patient tolerated treatment well    Behavior During Therapy [facility] for tasks assessed/performed                              Past Medical History:  Diagnosis Date   Abdominal pain, unspecified site 10/18/2012   Anemia    Anxiety    Arthritis    osteoarthritis   ASCUS (atypical squamous cells of undetermined significance) on Pap smear 05/06/2005   NEG HIGH RISK HPV--C&B BIOPSY BENIGN 12/2005   Asthma    Bipolar 2 disorder ([place])    Cancer ([place])    skin cancer - basal cell   Colon polyps    Complication of anesthesia    anxious afterwards, will get headaches    Constipation    Depression    Fibromyalgia 10/2013   GERD (gastroesophageal reflux disease)    Hearing loss on left    Heart murmur    never had any problems   Hemorrhoids    High cholesterol    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   cytology negative   IBS (irritable bowel syndrome)    Insomnia    Lymphocytic colitis    Lymphoma ([facility])    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   Bone marrow biopsy showes 8% plasma cells IgA Lambda   Migraines    Nausea alone 10/18/2012   Osteoarthritis    Osteopenia    Peripheral neuropathy    PTSD (post-traumatic stress disorder)    Past Surgical History:  Procedure Laterality Date   BONE MARROW BIOPSY Left 12/18/2013   Plasma cell dyscrasia 8% population of plasma cells   BREAST BIOPSY Right    benign stereo   CESAREAN SECTION  29,52,84   CHOLECYSTECTOMY N/A 10/16/2012    Procedure: LAPAROSCOPIC CHOLECYSTECTOMY;  Surgeon: Joyice Faster. Cornett, MD;  Location: [facility];  Service: General;  Laterality: N/A;   COLONOSCOPY     numerous times   DILATION AND CURETTAGE OF UTERUS     ESOPHAGOGASTRODUODENOSCOPY     HEMORRHOID SURGERY  1993   x3   IUD REMOVAL  02/2015   Mirena   LAPAROSCOPIC LYSIS OF ADHESIONS N/A 10/16/2012   Procedure: LAPAROSCOPIC LYSIS OF ADHESIONS;  Surgeon: Marcello Moores A. Cornett, MD;  Location: [facility];  Service: General;  Laterality: N/A;   LAPAROSCOPY N/A 10/16/2012   Procedure: LAPAROSCOPY DIAGNOSTIC;  Surgeon: Joyice Faster. Cornett, MD;  Location: [facility];  Service: General;  Laterality: N/A;   PELVIC LAPAROSCOPY     RADIOLOGY WITH ANESTHESIA N/A 12/16/2015   Procedure: MRI OF BRAIN WITH AND WITHOUT CONTRAST;  Surgeon: Medication Radiologist, MD;  Location: [facility];  Service: Radiology;  Laterality: N/A;   SHOULDER SURGERY  2007/2008   SPINE SURGERY  2010   cervical   Patient Active Problem List   Diagnosis Date Noted  GAD (generalized anxiety disorder) 12/26/2017   OCD (obsessive compulsive disorder) 12/26/2017   PTSD (post-traumatic stress disorder) 12/26/2017   DDD (degenerative disc disease), cervical 07/14/2016   Primary osteoarthritis of both feet 07/14/2016   Primary osteoarthritis of both hands 07/14/2016   Other fatigue 07/14/2016   History of IBS 07/14/2016   Osteopenia of multiple sites 07/14/2016   Fibromyalgia 01/13/2016   MGUS (monoclonal gammopathy of unknown significance) 12/23/2013   Chronic cholecystitis without calculus 10/18/2012   Abdominal pain, unspecified site 10/18/2012   Nausea alone 10/18/2012   Unspecified constipation 10/18/2012   Depression 10/18/2012   Anxiety    ASCUS (atypical squamous cells of undetermined significance) on Pap smear    IUD    Hemorrhoids 11/29/2010   Abdominal pain, left upper quadrant 11/29/2010    PCP:  Mayra Neer, MD  REFERRING PROVIDER: Mayra Neer, MD  REFERRING DIAG:  fibromyalgia  THERAPY DIAG:  Abnormal posture  Muscle weakness (generalized)  Cervicalgia  Polymyalgia rheumatica ([place])  Cramp and spasm  Rationale for Evaluation and Treatment Rehabilitation  ONSET DATE: chronic pain (fibromyalgia) with flare-up 2 months ago  SUBJECTIVE:                                                                                                                                                                                                         SUBJECTIVE STATEMENT: My dad passed away so this week has been crazy.  Dry needling on Monday really helped.  PERTINENT HISTORY:  Anxiety, bipolar disorder, depression, fibromyalgia, IBS, migraines, osteopenia, PTSD  PAIN:  Are you having pain?Yes 5/10 Pain location: whole body, stress, fatigue Pain description: sore, tight  Aggravating factors: activity, yard work, picking up heavy objects Relieving factors: muscle relaxers, rest, stretching  PRECAUTIONS: Other: chronic pain syndrome and depression Other: slow progression with exercise due to chronic condition  WEIGHT BEARING RESTRICTIONS No  FALLS:  Has patient fallen in last 6 months? No  LIVING ENVIRONMENT: Lives with: lives with their family Lives in: House/apartment  OCCUPATION: on disability  PLOF: Independent with basic ADLs and Leisure: yardwork, walking  Pt cares for her 18 young grandchildren- difficulty with care including lifting and carrying  PATIENT GOALS be more active with less pain, exercise at the gym regularly, lift and carry grandchildren and laundry, sleep with fewer interruptions, get back to more gardening.  OBJECTIVE:   DIAGNOSTIC FINDINGS: none recent   PATIENT SURVEYS: Eval: Fibromyalgia Impact Questionnaire: first 2 sections only: 49 12/26/21: 40  02/28/22: 28 (first 2 sections only)  COGNITION: Overall cognitive status: Within functional  limits for tasks assessed  SENSATION: WFL  POSTURE: rounded shoulders and  forward head  PALPATION: Diffuse palpable tenderness over bil neck, upper traps, thoracic, lumbar and gluteals with trigger points.    CERVICAL ROM:   Active ROM A/PROM (deg) eval A/ROM 12/26/21  Flexion 55   Extension 40   Right lateral flexion 30 35  Left lateral flexion 35 40  Right rotation 50 60  Left rotation 40 65   (Blank rows = not tested)  UPPER EXTREMITY ROM: UE A/ROM is limited by 20% into flexion and abduction with pain at end range.  Hip flexibility is limited by 25% in all directions with pain in all directions.  UPPER EXTREMITY MMT: UE: 4+/5, LE 4+/5 except hip flexors 4-/5  TODAY'S TREATMENT:   04/05/22:Pt arrives for aquatic physical therapy. Treatment took place in 3.5-5.5 feet of water. Water temperature was 91 degrees F. Pt entered the pool via stairs reciprocally with mild use of rails. Pt requires buoyancy of water for support and to offload joints with strengthening exercises. Seated water bench with 75% submersion Pt performed seated LE AROM exercises 20x in all planes,   Standing in 50%- 75% depth water pt performed water walking in all 4 directions 10x. VC for speed in order to generate appropriate current for resistance and/or UE movements. Water bells and UE single buoy wts used for push/pull with UE. 50% depth standing: knee ext Bil 10x with small noodle, single buoy UE wt push and hold with 4 lengths of high knee marching, hip 3 ways with ankle fins 20x Bil NO UE, core/lat press 20x with single buoy wts, 8 min horseback bicycle on yellow noodle.  04/03/22: NuStep: Level 3x 6 minutes-PT present to discuss progress Seated hamstring stretch 3x20 seconds  Seated figure 4 2x20 seconds  Sit to stand with 5# kettlebell 2x10 Seated flexion 1# 2x10 Ball roll outs: forward and lateral x5 each Manual:  Elongation and release to bil neck and upper traps   Trigger Point Dry-Needling  Treatment instructions: Expect mild to moderate muscle soreness. S/S of  pneumothorax if dry needled over a lung field, and to seek immediate medical attention should they occur. Patient verbalized understanding of these instructions and education.  Patient Consent Given: Yes Education handout provided: Previously provided Muscles treated: bil lumbar multifidi thoracic multifidi Treatment response/outcome: Utilized skilled palpation to identify trigger points.  During dry needling able to palpate muscle twitch and muscle elongation  Elongation and release to low back after DN Skilled palpation and monitoring by PT during dry needling   03/22/22: Pt arrives for aquatic physical therapy. Treatment took place in 3.5-5.5 feet of water. Water temperature was 91 degrees F. Pt entered the pool via stairs reciprocally with mild use of rails. Pt requires buoyancy of water for support and to offload joints with strengthening exercises. Seated water bench with 75% submersion Pt performed seated LE AROM exercises 20x in all planes,   Horizontal decompression float with 100% flotation and PTA providing lateral sway to mobilize trunk to down regulate her CNS and reduce pain.   03/21/22: Arm Bike: Level 1x 6 min (3/3)-PT present to discuss progress  Seated hamstring stretch 3x20 seconds  Seated figure 4 2x20 seconds  Sit to stand with 5# kettlebell 2x10 Standing rows and extension: red band 1x10 Ball roll outs: forward and lateral x5 each Manual:  Elongation and release to bil neck and upper traps   Trigger Point Dry-Needling  Treatment instructions: Expect mild to moderate muscle  soreness. S/S of pneumothorax if dry needled over a lung field, and to seek immediate medical attention should they occur. Patient verbalized understanding of these instructions and education.  Patient Consent Given: Yes Education handout provided: Previously provided Muscles treated: bil lumbar multifidi and gluteals Treatment response/outcome: Utilized skilled palpation to identify trigger points.   During dry needling able to palpate muscle twitch and muscle elongation  Elongation and release to low back after DN Skilled palpation and monitoring by PT during dry needling      PATIENT EDUCATION:  Education details: Access Code: [email or web address] (review from previous session). Discussed strategies to incorporate more movement into her day considering her depression.  Pt will work to implement these strategies.   Person educated: Patient Education method: Explanation, Demonstration, and Handouts Education comprehension: verbalized understanding and returned demonstration   HOME EXERCISE PROGRAM: Access Code: [email or web address]  ASSESSMENT:  CLINICAL IMPRESSION: .Pt arrives to aquatic PT good spirited despite difficult week. Pt able to complete full water routine with no fatigue or strain. Pt reports feeling "good and energized" post session.   OBJECTIVE IMPAIRMENTS decreased activity tolerance, decreased endurance, difficulty walking, decreased strength, increased muscle spasms, impaired flexibility, improper body mechanics, postural dysfunction, and pain.   ACTIVITY LIMITATIONS carrying, lifting, sitting, standing, reach over head, hygiene/grooming, and locomotion level  PARTICIPATION LIMITATIONS: meal prep, cleaning, laundry, driving, community activity, and yard work  PERSONAL FACTORS Past/current experiences, Time since onset of injury/illness/exacerbation, and 3+ comorbidities: fibromyalgia depression, anxiety,   are also affecting patient's functional outcome.   REHAB POTENTIAL: Good  CLINICAL DECISION MAKING: Evolving/moderate complexity  EVALUATION COMPLEXITY: Moderate   GOALS: Goals reviewed with patient? Yes  SHORT TERM GOALS: Target date: 03/20/22  Return to regular performance of HEP 2-3x/day to improve mobility  Baseline: stretching 2x/day (02/28/22) Goal status: In progress  2.  Report > or = to 25% reduction in cervical and lumbar pain with ADLs and self-care  Baseline: 20%  better overall (02/28/2022) Goal status: in progress   3.  Reduce fibromyalgia impact score to < or = to 39  Baseline: 28 (02/28/22) Goal status: MET    LONG TERM GOALS: Target date: 04/17/22  Be independent in advanced HEP Baseline: gentle exercise  Goal status: In progress   2.  Reduce fibromyalgia impact questionnaire to < or = to 21 Baseline: 28 (02/28/22) Goal status: Revised   3.  Lift and carry laundry and her grandchildren with > or = to 50% reduction in pain Baseline: 20% better (02/28/22) Goal status: In progress   4.  Report > or = to 50% reduction in neck and lumbar pain with ADLs and self-care  Baseline: 20% better (02/28/22) Goal status: In progress   5.  Return to yardwork verbalize appropriate rest breaks and mechanics  Baseline: hasn't been doing due to being sick (02/28/22) Goal status: In progress    PLAN: PT FREQUENCY: 2x/week  PT DURATION: 8 weeks  PLANNED INTERVENTIONS: Therapeutic exercises, Therapeutic activity, Neuromuscular re-education, Balance training, Gait training, Patient/Family education, Self Care, Joint mobilization, Aquatic Therapy, Dry Needling, Spinal manipulation, Spinal mobilization, Cryotherapy, Moist heat, Taping, Traction, Manual therapy, and Re-evaluation  PLAN FOR NEXT SESSION:  manual as needed, manage exercises as needed. Encourage gentle mobility. Aquatics to manage chronic pain.    Myrene Galas, PTA 04/05/22 4:30 PM   [facility] [address] [place], [place] [address] Phone # [phone] Fax [phone]

## 2022-04-06 DIAGNOSIS — R319 Hematuria, unspecified: Secondary | ICD-10-CM | POA: Diagnosis not present

## 2022-04-06 DIAGNOSIS — E7800 Pure hypercholesterolemia, unspecified: Secondary | ICD-10-CM | POA: Diagnosis not present

## 2022-04-06 DIAGNOSIS — D472 Monoclonal gammopathy: Secondary | ICD-10-CM | POA: Diagnosis not present

## 2022-04-06 DIAGNOSIS — F4310 Post-traumatic stress disorder, unspecified: Secondary | ICD-10-CM | POA: Diagnosis not present

## 2022-04-06 DIAGNOSIS — R7301 Impaired fasting glucose: Secondary | ICD-10-CM | POA: Diagnosis not present

## 2022-04-06 DIAGNOSIS — F4321 Adjustment disorder with depressed mood: Secondary | ICD-10-CM | POA: Diagnosis not present

## 2022-04-06 DIAGNOSIS — E538 Deficiency of other specified B group vitamins: Secondary | ICD-10-CM | POA: Diagnosis not present

## 2022-04-06 DIAGNOSIS — S0300XA Dislocation of jaw, unspecified side, initial encounter: Secondary | ICD-10-CM | POA: Diagnosis not present

## 2022-04-06 DIAGNOSIS — J45909 Unspecified asthma, uncomplicated: Secondary | ICD-10-CM | POA: Diagnosis not present

## 2022-04-06 DIAGNOSIS — F411 Generalized anxiety disorder: Secondary | ICD-10-CM | POA: Diagnosis not present

## 2022-04-06 DIAGNOSIS — R8290 Unspecified abnormal findings in urine: Secondary | ICD-10-CM | POA: Diagnosis not present

## 2022-04-07 ENCOUNTER — Ambulatory Visit (INDEPENDENT_AMBULATORY_CARE_PROVIDER_SITE_OTHER): Payer: Medicare Other | Admitting: Psychology

## 2022-04-07 ENCOUNTER — Other Ambulatory Visit (HOSPITAL_COMMUNITY): Payer: Self-pay | Admitting: Family Medicine

## 2022-04-07 DIAGNOSIS — F902 Attention-deficit hyperactivity disorder, combined type: Secondary | ICD-10-CM

## 2022-04-07 DIAGNOSIS — F411 Generalized anxiety disorder: Secondary | ICD-10-CM | POA: Diagnosis not present

## 2022-04-07 DIAGNOSIS — F4310 Post-traumatic stress disorder, unspecified: Secondary | ICD-10-CM

## 2022-04-07 DIAGNOSIS — E7800 Pure hypercholesterolemia, unspecified: Secondary | ICD-10-CM

## 2022-04-07 NOTE — Progress Notes (Signed)
                Kaleyah Labreck G Mirna Sutcliffe, LCSW

## 2022-04-11 NOTE — Therapy (Unsigned)
OUTPATIENT PHYSICAL THERAPY TREATMENT   Patient Name: Danielle Bruce MRN: [id number] DOB:[birth year], 62 y.o., female Today's Date: 04/12/2022        PT End of Session - 04/12/22 1151     Visit Number 28    Date for PT Re-Evaluation 04/17/22    Authorization Type Medicare B- needs KX  at 41 for 2024 (8 for 2024 on 04/23/22)    Progress Note Due on Visit 29    PT Start Time 1020    PT Stop Time 1100    PT Time Calculation (min) 40 min    Activity Tolerance Patient tolerated treatment well    Behavior During Therapy [facility] for tasks assessed/performed                               Past Medical History:  Diagnosis Date   Abdominal pain, unspecified site 10/18/2012   Anemia    Anxiety    Arthritis    osteoarthritis   ASCUS (atypical squamous cells of undetermined significance) on Pap smear 05/06/2005   NEG HIGH RISK HPV--C&B BIOPSY BENIGN 12/2005   Asthma    Bipolar 2 disorder ([place])    Cancer ([place])    skin cancer - basal cell   Colon polyps    Complication of anesthesia    anxious afterwards, will get headaches    Constipation    Depression    Fibromyalgia 10/2013   GERD (gastroesophageal reflux disease)    Hearing loss on left    Heart murmur    never had any problems   Hemorrhoids    High cholesterol    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   cytology negative   IBS (irritable bowel syndrome)    Insomnia    Lymphocytic colitis    Lymphoma (HCC)    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   Bone marrow biopsy showes 8% plasma cells IgA Lambda   Migraines    Nausea alone 10/18/2012   Osteoarthritis    Osteopenia    Peripheral neuropathy    PTSD (post-traumatic stress disorder)    Past Surgical History:  Procedure Laterality Date   BONE MARROW BIOPSY Left 12/18/2013   Plasma cell dyscrasia 8% population of plasma cells   BREAST BIOPSY Right    benign stereo   CESAREAN SECTION  [id number]   CHOLECYSTECTOMY N/A 10/16/2012    Procedure: LAPAROSCOPIC CHOLECYSTECTOMY;  Surgeon: Joyice Faster. Cornett, MD;  Location: [facility];  Service: General;  Laterality: N/A;   COLONOSCOPY     numerous times   DILATION AND CURETTAGE OF UTERUS     ESOPHAGOGASTRODUODENOSCOPY     HEMORRHOID SURGERY  1993   x3   IUD REMOVAL  02/2015   Mirena   LAPAROSCOPIC LYSIS OF ADHESIONS N/A 10/16/2012   Procedure: LAPAROSCOPIC LYSIS OF ADHESIONS;  Surgeon: Marcello Moores A. Cornett, MD;  Location: [facility];  Service: General;  Laterality: N/A;   LAPAROSCOPY N/A 10/16/2012   Procedure: LAPAROSCOPY DIAGNOSTIC;  Surgeon: Joyice Faster. Cornett, MD;  Location: [facility];  Service: General;  Laterality: N/A;   PELVIC LAPAROSCOPY     RADIOLOGY WITH ANESTHESIA N/A 12/16/2015   Procedure: MRI OF BRAIN WITH AND WITHOUT CONTRAST;  Surgeon: Medication Radiologist, MD;  Location: [place];  Service: Radiology;  Laterality: N/A;   SHOULDER SURGERY  2007/2008   SPINE SURGERY  2010   cervical   Patient Active Problem List   Diagnosis Date Noted  GAD (generalized anxiety disorder) 12/26/2017   OCD (obsessive compulsive disorder) 12/26/2017   PTSD (post-traumatic stress disorder) 12/26/2017   DDD (degenerative disc disease), cervical 07/14/2016   Primary osteoarthritis of both feet 07/14/2016   Primary osteoarthritis of both hands 07/14/2016   Other fatigue 07/14/2016   History of IBS 07/14/2016   Osteopenia of multiple sites 07/14/2016   Fibromyalgia 01/13/2016   MGUS (monoclonal gammopathy of unknown significance) 12/23/2013   Chronic cholecystitis without calculus 10/18/2012   Abdominal pain, unspecified site 10/18/2012   Nausea alone 10/18/2012   Unspecified constipation 10/18/2012   Depression 10/18/2012   Anxiety    ASCUS (atypical squamous cells of undetermined significance) on Pap smear    IUD    Hemorrhoids 11/29/2010   Abdominal pain, left upper quadrant 11/29/2010    PCP:  Mayra Neer, MD  REFERRING PROVIDER: Mayra Neer, MD  REFERRING DIAG:  fibromyalgia  THERAPY DIAG:  Abnormal posture  Muscle weakness (generalized)  Cervicalgia  Rationale for Evaluation and Treatment Rehabilitation  ONSET DATE: chronic pain (fibromyalgia) with flare-up 2 months ago  SUBJECTIVE:                                                                                                                                                                                                         SUBJECTIVE STATEMENT: Pt reported blowing leaves for an hour AND holding both small twins to rock to sleep and she had no increased neck or shoulder pain.   PERTINENT HISTORY:  Anxiety, bipolar disorder, depression, fibromyalgia, IBS, migraines, osteopenia, PTSD  PAIN:  Are you having pain?Yes 2/10 Pain location: whole body, stress, fatigue Pain description: sore, tight  Aggravating factors: activity, yard work, picking up heavy objects Relieving factors: muscle relaxers, rest, stretching  PRECAUTIONS: Other: chronic pain syndrome and depression Other: slow progression with exercise due to chronic condition  WEIGHT BEARING RESTRICTIONS No  FALLS:  Has patient fallen in last 6 months? No  LIVING ENVIRONMENT: Lives with: lives with their family Lives in: House/apartment  OCCUPATION: on disability  PLOF: Independent with basic ADLs and Leisure: yardwork, walking  Pt cares for her 39 young grandchildren- difficulty with care including lifting and carrying  PATIENT GOALS be more active with less pain, exercise at the gym regularly, lift and carry grandchildren and laundry, sleep with fewer interruptions, get back to more gardening.  OBJECTIVE:   DIAGNOSTIC FINDINGS: none recent   PATIENT SURVEYS: Eval: Fibromyalgia Impact Questionnaire: first 2 sections only: 49 12/26/21: 40  02/28/22: 28 (first 2 sections only)  COGNITION: Overall cognitive status: Within  functional limits for tasks assessed  SENSATION: WFL  POSTURE: rounded shoulders and  forward head  PALPATION: Diffuse palpable tenderness over bil neck, upper traps, thoracic, lumbar and gluteals with trigger points.    CERVICAL ROM:   Active ROM A/PROM (deg) eval A/ROM 12/26/21  Flexion 55   Extension 40   Right lateral flexion 30 35  Left lateral flexion 35 40  Right rotation 50 60  Left rotation 40 65   (Blank rows = not tested)  UPPER EXTREMITY ROM: UE A/ROM is limited by 20% into flexion and abduction with pain at end range.  Hip flexibility is limited by 25% in all directions with pain in all directions.  UPPER EXTREMITY MMT: UE: 4+/5, LE 4+/5 except hip flexors 4-/5  TODAY'S TREATMENT:  04/12/22:Pt arrives for aquatic physical therapy. Treatment took place in 3.5-5.5 feet of water. Water temperature was 91 degrees F. Pt entered the pool via stairs reciprocally with mild use of rails. Pt requires buoyancy of water for support and to offload joints with strengthening exercises. Pt utilizes viscosity of the water required for strengthening. Seated water bench with 75% submersion Pt performed seated LE AROM exercises 20x in all planes,   Standing in 50%- 75% depth water pt performed water walking in all 4 directions 10x. VC for speed in order to generate appropriate current for resistance and/or UE movements. Water bells and UE single buoy wts used for push/pull with UE. 50% depth standing: knee ext Bil 10x2 with small noodle, single buoy UE wt push and hold with 6 lengths of high knee marching, hip 3 ways with ankle fins 20x Bil NO UE, core/lat press 20x with single buoy wts, 10 min horseback bicycle on yellow noodle.    04/05/22:Pt arrives for aquatic physical therapy. Treatment took place in 3.5-5.5 feet of water. Water temperature was 91 degrees F. Pt entered the pool via stairs reciprocally with mild use of rails. Pt requires buoyancy of water for support and to offload joints with strengthening exercises. Seated water bench with 75% submersion Pt performed  seated LE AROM exercises 20x in all planes,   Standing in 50%- 75% depth water pt performed water walking in all 4 directions 10x. VC for speed in order to generate appropriate current for resistance and/or UE movements. Water bells and UE single buoy wts used for push/pull with UE. 50% depth standing: knee ext Bil 10x with small noodle, single buoy UE wt push and hold with 4 lengths of high knee marching, hip 3 ways with ankle fins 20x Bil NO UE, core/lat press 20x with single buoy wts, 8 min horseback bicycle on yellow noodle.  04/03/22: NuStep: Level 3x 6 minutes-PT present to discuss progress Seated hamstring stretch 3x20 seconds  Seated figure 4 2x20 seconds Pt arrives for aquatic physical therapy. Treatment took place in 3.5-5.5 feet of water. Water temperature was 91 degrees F. Pt entered the pool via stairs reciprocally with mild use of rails. Pt requires buoyancy of water for support and to offload joints with strengthening exercises. Seated water bench with 75% submersion Pt performed seated LE AROM exercises 20x in all planes,   Standing in 50%- 75% depth water pt performed water walking in all 4 directions 10x. VC for speed in order to generate appropriate current for resistance and/or UE movements. Water bells and UE single buoy wts used for push/pull with UE. 50% depth standing: knee ext Bil 10x with small noodle, single buoy UE wt push and hold with 4 lengths  of high knee marching, hip 3 ways with ankle fins 20x Bil NO UE, core/lat press 20x with single buoy wts, 8 min horseback bicycle on yellow noodle.  Sit to stand with 5# kettlebell 2x10 Seated flexion 1# 2x10 Ball roll outs: forward and lateral x5 each Manual:  Elongation and release to bil neck and upper traps   Trigger Point Dry-Needling  Treatment instructions: Expect mild to moderate muscle soreness. S/S of pneumothorax if dry needled over a lung field, and to seek immediate medical attention should they occur. Patient  verbalized understanding of these instructions and education.  Patient Consent Given: Yes Education handout provided: Previously provided Muscles treated: bil lumbar multifidi thoracic multifidi Treatment response/outcome: Utilized skilled palpation to identify trigger points.  During dry needling able to palpate muscle twitch and muscle elongation  Elongation and release to low back after DN Skilled palpation and monitoring by PT during dry needling     PATIENT EDUCATION:  Education details: Access Code: [email or web address] (review from previous session). Discussed strategies to incorporate more movement into her day considering her depression.  Pt will work to implement these strategies.   Person educated: Patient Education method: Explanation, Demonstration, and Handouts Education comprehension: verbalized understanding and returned demonstration   HOME EXERCISE PROGRAM: Access Code: [email or web address]  ASSESSMENT:  CLINICAL IMPRESSION: Pt was able to perform two demanding activities yesterday without pain resulting. She believes a month ago she would have had intense neck pain.Pt able to increase a few reps today to further work on her activity tolerance.  OBJECTIVE IMPAIRMENTS decreased activity tolerance, decreased endurance, difficulty walking, decreased strength, increased muscle spasms, impaired flexibility, improper body mechanics, postural dysfunction, and pain.   ACTIVITY LIMITATIONS carrying, lifting, sitting, standing, reach over head, hygiene/grooming, and locomotion level  PARTICIPATION LIMITATIONS: meal prep, cleaning, laundry, driving, community activity, and yard work  PERSONAL FACTORS Past/current experiences, Time since onset of injury/illness/exacerbation, and 3+ comorbidities: fibromyalgia depression, anxiety,   are also affecting patient's functional outcome.   REHAB POTENTIAL: Good  CLINICAL DECISION MAKING: Evolving/moderate complexity  EVALUATION COMPLEXITY:  Moderate   GOALS: Goals reviewed with patient? Yes  SHORT TERM GOALS: Target date: 03/20/22  Return to regular performance of HEP 2-3x/day to improve mobility  Baseline: stretching 2x/day (02/28/22) Goal status: In progress  2.  Report > or = to 25% reduction in cervical and lumbar pain with ADLs and self-care  Baseline: 20% better overall (02/28/2022) Goal status: in progress   3.  Reduce fibromyalgia impact score to < or = to 39  Baseline: 28 (02/28/22) Goal status: MET    LONG TERM GOALS: Target date: 04/17/22  Be independent in advanced HEP Baseline: gentle exercise  Goal status: In progress   2.  Reduce fibromyalgia impact questionnaire to < or = to 21 Baseline: 28 (02/28/22) Goal status: Revised   3.  Lift and carry laundry and her grandchildren with > or = to 50% reduction in pain Baseline: 20% better (02/28/22) Goal status: In progress   4.  Report > or = to 50% reduction in neck and lumbar pain with ADLs and self-care  Baseline: 20% better (02/28/22) Goal status: In progress   5.  Return to yardwork verbalize appropriate rest breaks and mechanics  Baseline: hasn't been doing due to being sick (02/28/22) Goal status: In progress    PLAN: PT FREQUENCY: 2x/week  PT DURATION: 8 weeks  PLANNED INTERVENTIONS: Therapeutic exercises, Therapeutic activity, Neuromuscular re-education, Balance training, Gait training, Patient/Family education, Self Care, Joint mobilization, Aquatic Therapy, Dry  Needling, Spinal manipulation, Spinal mobilization, Cryotherapy, Moist heat, Taping, Traction, Manual therapy, and Re-evaluation  PLAN FOR NEXT SESSION:  manual as needed, manage exercises as needed. Encourage gentle mobility. Aquatics to manage chronic pain.    Myrene Galas, PTA 04/12/22 9:34 PM   [facility] [address], [address] [place], [place] [address] Phone # [phone] Fax [phone]

## 2022-04-12 ENCOUNTER — Encounter: Payer: Self-pay | Admitting: Physical Therapy

## 2022-04-12 ENCOUNTER — Ambulatory Visit: Payer: Medicare Other | Admitting: Physical Therapy

## 2022-04-12 DIAGNOSIS — R252 Cramp and spasm: Secondary | ICD-10-CM | POA: Diagnosis not present

## 2022-04-12 DIAGNOSIS — R293 Abnormal posture: Secondary | ICD-10-CM

## 2022-04-12 DIAGNOSIS — M542 Cervicalgia: Secondary | ICD-10-CM

## 2022-04-12 DIAGNOSIS — M6281 Muscle weakness (generalized): Secondary | ICD-10-CM | POA: Diagnosis not present

## 2022-04-12 DIAGNOSIS — M353 Polymyalgia rheumatica: Secondary | ICD-10-CM | POA: Diagnosis not present

## 2022-04-14 ENCOUNTER — Ambulatory Visit (INDEPENDENT_AMBULATORY_CARE_PROVIDER_SITE_OTHER): Payer: Medicare Other | Admitting: Psychology

## 2022-04-14 ENCOUNTER — Telehealth: Payer: Self-pay | Admitting: Psychiatry

## 2022-04-14 ENCOUNTER — Other Ambulatory Visit: Payer: Self-pay

## 2022-04-14 DIAGNOSIS — F902 Attention-deficit hyperactivity disorder, combined type: Secondary | ICD-10-CM

## 2022-04-14 DIAGNOSIS — F428 Other obsessive-compulsive disorder: Secondary | ICD-10-CM

## 2022-04-14 DIAGNOSIS — F4310 Post-traumatic stress disorder, unspecified: Secondary | ICD-10-CM | POA: Diagnosis not present

## 2022-04-14 DIAGNOSIS — F411 Generalized anxiety disorder: Secondary | ICD-10-CM

## 2022-04-14 DIAGNOSIS — F5081 Binge eating disorder: Secondary | ICD-10-CM

## 2022-04-14 MED ORDER — LISDEXAMFETAMINE DIMESYLATE 40 MG PO CAPS: 40.0000 mg | ORAL_CAPSULE | Freq: Every day | ORAL | 0 refills | Status: DC

## 2022-04-14 NOTE — Telephone Encounter (Signed)
Pt Lvm @ 8:49a.  She would like refill of Vyvanse sent to   [place], [address] AT [place] [facility] [address] [place] [address] Phone: [phone]  Fax: [phone]   Next appt 2/28

## 2022-04-14 NOTE — Telephone Encounter (Signed)
Pended.

## 2022-04-16 NOTE — Progress Notes (Signed)
[place] Counselor/Therapist Progress Note  Patient ID: Danielle Bruce, MRN: [id number],    Date: 04/14/2022  Time Spent: 60 minutes  Treatment Type: Individual Therapy  Reported Symptoms: flashbacks, responding to triggers, depression, anxiety, being busy all of the time, dissociation  Mental Status Exam: Appearance:  Casual     Behavior: Appropriate  Motor: Restlestness  Speech/Language:  Clear and Coherent  Affect: Blunt  Mood: anxious  Thought process: normal  Thought content:   WNL  Sensory/Perceptual disturbances:   WNL  Orientation: oriented to person, place, time/date, and situation  Attention: Good  Concentration: Good  Memory: WNL  Fund of knowledge:  Good  Insight:   Good  Judgment:  Fair  Impulse Control: Good   Risk Assessment: Danger to Self:  No Self-injurious Behavior: No Danger to Others: No Duty to Warn:no Physical Aggression / Violence:No  Access to Firearms a concern: No  Gang Involvement:No   Subjective: The patient attended a face-to-face individual therapy session in the office today.  The patient reports that she feels like she is doing okay with the death of her father.  She presents as pleasant and cooperative today.  Today we talked about the importance of staying in the present moment.  I recommended a couple of books for the patient to read.  She talked about feeling more anxious and I explained that some of her anxiety could be related to "what if" thinking.  The patient talked about not really knowing the point of meditation and mindfulness.   I explained to her that it gives her the opportunity to help herself sort through the thoughts that might be helpful and those that are not.  The patient understood the concepts discussed.   Interventions: Cognitive Behavioral Therapy, Mindfulness Meditation, Eye Movement Desensitization and Reprocessing (EMDR), Insight-Oriented, and Interpersonal  Diagnosis:PTSD (post-traumatic  stress disorder)  Generalized anxiety disorder  Obsessional thoughts  Attention deficit hyperactivity disorder (ADHD), combined type  Plan: Plan of Care: Client Abilities/Strengths  Insightful, motivated, Supportive family Client Treatment Preferences  Outpatient Individual therapy/EMDR  Client Statement of Needs  "I think I have PTSD and I feel like I need another kind of therapy than talk therapy" Treatment Level  Outpatient Individual therapy  Symptoms  Demonstrates an exaggerated startle response.:  (Status: maintained). Depressed  or irritable mood.: (Status:maintained). Describes a reliving of the event,  particularly through dissociative flashbacks.: (Status: maintained). Displays a  significant decline in interest and engagement in activities.:  (Status: maintained). Displays significant psychological and/or physiological distress resulting from internal and external  clues that are reminiscent of the traumatic event.: (Status: maintained).  Experiences disturbances in sleep.:  (Status: maintained). Experiences disturbing  and persistent thoughts, images, and/or perceptions of the traumatic event.:  (Status: maintained). Experiences frequent nightmares.: (Status: maintained).  Feelings of hopelessness, worthlessness, or inappropriate guilt.: No Description Entered (Status:  improved). Has been exposed to a traumatic event involving actual or perceived threat of death or  serious injury.:  (Status: maintained). Impairment in social, occupational, or  other areas of functioning.:(Status: maintained). Intentionally avoids activities,  places, people, or objects (e.g., up-armored vehicles) that evoke memories of the event.: (Status: maintained). Intentionally avoids thoughts, feelings, or discussions related  to the traumatic event.: (Status: maintained). Reports difficulty concentrating as  well as feelings of guilt (Status:maintained). Reports response of intense fear,   helplessness, or horror to the traumatic event.:  (Status: maintained).  Problems Addressed  Unipolar Depression, Posttraumatic Stress Disorder (PTSD), Posttraumatic Stress Disorder (  PTSD),   Posttraumatic Stress Disorder (PTSD), Posttraumatic Stress Disorder (PTSD)  Goals 1. Develop healthy thinking patterns and beliefs about self, others, and the world that lead to the alleviation and help prevent the relapse of  depression. Objective Identify and replace thoughts and beliefs that support depression. Target Date: 2023/01/04 Frequency: Weekly Progress: 0 Modality: individual Related Interventions 1. Explore and restructure underlying assumptions and beliefs reflected in biased self-talk that  may put the client at risk for relapse or recurrence. 2. Conduct Cognitive-Behavioral Therapy (see Cognitive Behavior Therapy by Olevia Bowens; Overcoming Depression by Lynita Lombard al.), beginning with helping the client learn the connection among  cognition, depressive feelings, and actions. 2. Eliminate or reduce the negative impact trauma related symptoms have  on social, occupational, and family functioning. Objective Learn and implement personal skills to manage challenging situations related to trauma. Target Date: 2023/01/04 Frequency: Weekly Progress: 0 Modality: individual 3. No longer avoids persons, places, activities, and objects that are  reminiscent of the traumatic event. Objective Participate in Eye Movement Desensitization and Reprocessing (EMDR) to reduce emotional distress  related to traumatic thoughts, feelings, and images. Target Date: 2023/01/04 Frequency: Weekly Progress: 0 Modality: individual   Related Interventions 1. Utilize Eye Movement Desensitization and Reprocessing (EMDR) to reduce the client's  emotional reactivity to the traumatic event and reduce PTSD symptoms. Objective Learn and implement guided self-dialogue to manage thoughts, feelings, and urges brought on  by  encounters with trauma-related situations. Target Date: 2024-11/07 Frequency: Weekly Progress: 0 Modality: individual Related Interventions 1. Teach the client a guided self-dialogue procedure in which he/she learns to recognize  maladaptive self-talk, challenges its biases, copes with engendered feelings, overcomes  avoidance, and reinforces his/her accomplishments; review and reinforce progress, problemsolve obstacles. 4. No longer experiences intrusive event recollections, avoidance of event  reminders, intense arousal, or disinterest in activities or  relationships. 5. Thinks about or openly discusses the traumatic event with others  without experiencing psychological or physiological distress. Diagnosis Axis  none F43.10 (Posttraumatic stress disorder) - Open - [Signifier: n/a] Posttraumatic Stress  Disorder  Conditions For Discharge Achievement of treatment goals and objectives   Teonna Coonan G Taydem Cavagnaro, LCSW

## 2022-04-17 DIAGNOSIS — H33321 Round hole, right eye: Secondary | ICD-10-CM | POA: Diagnosis not present

## 2022-04-18 ENCOUNTER — Ambulatory Visit: Payer: Medicare Other | Admitting: Psychology

## 2022-04-18 NOTE — Therapy (Unsigned)
OUTPATIENT PHYSICAL THERAPY TREATMENT   Patient Name: Danielle Bruce MRN: [id number] DOB:1960/09/15, 62 y.o., female Today's Date: 04/19/2022    Progress Note Reporting Period 03/01/22 to 04/19/22  See note below for Objective Data and Assessment of Progress/Goals.        PT End of Session - 04/19/22 2108     Visit Number 29    Date for PT Re-Evaluation 04/17/22    Progress Note Due on Visit 29    PT Start Time 1020    PT Stop Time 1100    PT Time Calculation (min) 40 min    Activity Tolerance Patient tolerated treatment well    Behavior During Therapy [facility] for tasks assessed/performed                                Past Medical History:  Diagnosis Date   Abdominal pain, unspecified site 10/18/2012   Anemia    Anxiety    Arthritis    osteoarthritis   ASCUS (atypical squamous cells of undetermined significance) on Pap smear 05/06/2005   NEG HIGH RISK HPV--C&B BIOPSY BENIGN 12/2005   Asthma    Bipolar 2 disorder ([place])    Cancer ([place])    skin cancer - basal cell   Colon polyps    Complication of anesthesia    anxious afterwards, will get headaches    Constipation    Depression    Fibromyalgia 10/2013   GERD (gastroesophageal reflux disease)    Hearing loss on left    Heart murmur    never had any problems   Hemorrhoids    High cholesterol    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   cytology negative   IBS (irritable bowel syndrome)    Insomnia    Lymphocytic colitis    Lymphoma (HCC)    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   Bone marrow biopsy showes 8% plasma cells IgA Lambda   Migraines    Nausea alone 10/18/2012   Osteoarthritis    Osteopenia    Peripheral neuropathy    PTSD (post-traumatic stress disorder)    Past Surgical History:  Procedure Laterality Date   BONE MARROW BIOPSY Left 12/18/2013   Plasma cell dyscrasia 8% population of plasma cells   BREAST BIOPSY Right    benign stereo   CESAREAN SECTION   [id number]   CHOLECYSTECTOMY N/A 10/16/2012   Procedure: LAPAROSCOPIC CHOLECYSTECTOMY;  Surgeon: Joyice Faster. Cornett, MD;  Location: [facility];  Service: General;  Laterality: N/A;   COLONOSCOPY     numerous times   DILATION AND CURETTAGE OF UTERUS     ESOPHAGOGASTRODUODENOSCOPY     HEMORRHOID SURGERY  1993   x3   IUD REMOVAL  02/2015   Mirena   LAPAROSCOPIC LYSIS OF ADHESIONS N/A 10/16/2012   Procedure: LAPAROSCOPIC LYSIS OF ADHESIONS;  Surgeon: Marcello Moores A. Cornett, MD;  Location: [facility];  Service: General;  Laterality: N/A;   LAPAROSCOPY N/A 10/16/2012   Procedure: LAPAROSCOPY DIAGNOSTIC;  Surgeon: Joyice Faster. Cornett, MD;  Location: [facility];  Service: General;  Laterality: N/A;   PELVIC LAPAROSCOPY     RADIOLOGY WITH ANESTHESIA N/A 12/16/2015   Procedure: MRI OF BRAIN WITH AND WITHOUT CONTRAST;  Surgeon: Medication Radiologist, MD;  Location: [place];  Service: Radiology;  Laterality: N/A;   SHOULDER SURGERY  2007/2008   SPINE SURGERY  2010   cervical   Patient Active Problem List   Diagnosis  Date Noted   GAD (generalized anxiety disorder) 12/26/2017   OCD (obsessive compulsive disorder) 12/26/2017   PTSD (post-traumatic stress disorder) 12/26/2017   DDD (degenerative disc disease), cervical 07/14/2016   Primary osteoarthritis of both feet 07/14/2016   Primary osteoarthritis of both hands 07/14/2016   Other fatigue 07/14/2016   History of IBS 07/14/2016   Osteopenia of multiple sites 07/14/2016   Fibromyalgia 01/13/2016   MGUS (monoclonal gammopathy of unknown significance) 12/23/2013   Chronic cholecystitis without calculus 10/18/2012   Abdominal pain, unspecified site 10/18/2012   Nausea alone 10/18/2012   Unspecified constipation 10/18/2012   Depression 10/18/2012   Anxiety    ASCUS (atypical squamous cells of undetermined significance) on Pap smear    IUD    Hemorrhoids 11/29/2010   Abdominal pain, left upper quadrant 11/29/2010    PCP:  Mayra Neer, MD  REFERRING PROVIDER:  Mayra Neer, MD  REFERRING DIAG: fibromyalgia  THERAPY DIAG:  Abnormal posture  Muscle weakness (generalized)  Cervicalgia  Rationale for Evaluation and Treatment Rehabilitation  ONSET DATE: chronic pain (fibromyalgia) with flare-up 2 months ago  SUBJECTIVE:                                                                                                                                                                                                         SUBJECTIVE STATEMENT:  Woke up stiff but doing ok now.   PERTINENT HISTORY:  Anxiety, bipolar disorder, depression, fibromyalgia, IBS, migraines, osteopenia, PTSD  PAIN:  Are you having pain?Yes 2/10 Pain location: whole body, stress, fatigue Pain description: sore, tight  Aggravating factors: activity, yard work, picking up heavy objects Relieving factors: muscle relaxers, rest, stretching  PRECAUTIONS: Other: chronic pain syndrome and depression Other: slow progression with exercise due to chronic condition  WEIGHT BEARING RESTRICTIONS No  FALLS:  Has patient fallen in last 6 months? No  LIVING ENVIRONMENT: Lives with: lives with their family Lives in: House/apartment  OCCUPATION: on disability  PLOF: Independent with basic ADLs and Leisure: yardwork, walking  Pt cares for her 51 young grandchildren- difficulty with care including lifting and carrying  PATIENT GOALS be more active with less pain, exercise at the gym regularly, lift and carry grandchildren and laundry, sleep with fewer interruptions, get back to more gardening.  OBJECTIVE:   DIAGNOSTIC FINDINGS: none recent   PATIENT SURVEYS: Eval: Fibromyalgia Impact Questionnaire: first 2 sections only: 49 12/26/21: 40  02/28/22: 28 (first 2 sections only)  COGNITION: Overall cognitive status: Within functional limits for tasks assessed  SENSATION: WFL  POSTURE: rounded shoulders and  forward head  PALPATION: Diffuse palpable tenderness over bil  neck, upper traps, thoracic, lumbar and gluteals with trigger points.    CERVICAL ROM:   Active ROM A/PROM (deg) eval A/ROM 12/26/21  Flexion 55   Extension 40   Right lateral flexion 30 35  Left lateral flexion 35 40  Right rotation 50 60  Left rotation 40 65   (Blank rows = not tested)  UPPER EXTREMITY ROM: UE A/ROM is limited by 20% into flexion and abduction with pain at end range.  Hip flexibility is limited by 25% in all directions with pain in all directions.  UPPER EXTREMITY MMT: UE: 4+/5, LE 4+/5 except hip flexors 4-/5  TODAY'S TREATMENT:   04/19/22:Pt arrives for aquatic physical therapy. Treatment took place in 3.5-5.5 feet of water. Water temperature was 91 degrees F. Pt entered the pool via stairs reciprocally with mild use of rails. Pt requires buoyancy of water for support and to offload joints with strengthening exercises. Pt utilizes viscosity of the water required for strengthening. Seated water bench with 75% submersion Pt performed seated LE AROM exercises 20x in all planes,   Standing in 50%- 75% depth water pt performed water walking in all 4 directions 10x. VC for speed in order to generate appropriate current for resistance and/or UE movements. Water bells and UE single buoy wts used for push/pull with UE. 50% depth standing: knee ext Bil 15x2 with small noodle, single buoy UE wt push and hold with 6 lengths of high knee marching, hip 3 ways with ankle fins 20x Bil NO UE, core/lat press 20x with double buoy wts, 10 min horseback bicycle on yellow noodle.   04/12/22:Pt arrives for aquatic physical therapy. Treatment took place in 3.5-5.5 feet of water. Water temperature was 91 degrees F. Pt entered the pool via stairs reciprocally with mild use of rails. Pt requires buoyancy of water for support and to offload joints with strengthening exercises. Pt utilizes viscosity of the water required for strengthening. Seated water bench with 75% submersion Pt performed  seated LE AROM exercises 20x in all planes,   Standing in 50%- 75% depth water pt performed water walking in all 4 directions 10x. VC for speed in order to generate appropriate current for resistance and/or UE movements. Water bells and UE single buoy wts used for push/pull with UE. 50% depth standing: knee ext Bil 10x2 with small noodle, single buoy UE wt push and hold with 6 lengths of high knee marching, hip 3 ways with ankle fins 20x Bil NO UE, core/lat press 20x with single buoy wts, 10 min horseback bicycle on yellow noodle.    04/05/22:Pt arrives for aquatic physical therapy. Treatment took place in 3.5-5.5 feet of water. Water temperature was 91 degrees F. Pt entered the pool via stairs reciprocally with mild use of rails. Pt requires buoyancy of water for support and to offload joints with strengthening exercises. Seated water bench with 75% submersion Pt performed seated LE AROM exercises 20x in all planes,   Standing in 50%- 75% depth water pt performed water walking in all 4 directions 10x. VC for speed in order to generate appropriate current for resistance and/or UE movements. Water bells and UE single buoy wts used for push/pull with UE. 50% depth standing: knee ext Bil 10x with small noodle, single buoy UE wt push and hold with 4 lengths of high knee marching, hip 3 ways with ankle fins 20x Bil NO UE, core/lat press 20x with single buoy wts, 8 min  horseback bicycle on yellow noodle.  04/03/22: NuStep: Level 3x 6 minutes-PT present to discuss progress Seated hamstring stretch 3x20 seconds  Seated figure 4 2x20 seconds Pt arrives for aquatic physical therapy. Treatment took place in 3.5-5.5 feet of water. Water temperature was 91 degrees F. Pt entered the pool via stairs reciprocally with mild use of rails. Pt requires buoyancy of water for support and to offload joints with strengthening exercises. Seated water bench with 75% submersion Pt performed seated LE AROM exercises 20x in all  planes,   Standing in 50%- 75% depth water pt performed water walking in all 4 directions 10x. VC for speed in order to generate appropriate current for resistance and/or UE movements. Water bells and UE single buoy wts used for push/pull with UE. 50% depth standing: knee ext Bil 10x with small noodle, single buoy UE wt push and hold with 4 lengths of high knee marching, hip 3 ways with ankle fins 20x Bil NO UE, core/lat press 20x with single buoy wts, 8 min horseback bicycle on yellow noodle.  Sit to stand with 5# kettlebell 2x10 Seated flexion 1# 2x10 Ball roll outs: forward and lateral x5 each Manual:  Elongation and release to bil neck and upper traps   Trigger Point Dry-Needling  Treatment instructions: Expect mild to moderate muscle soreness. S/S of pneumothorax if dry needled over a lung field, and to seek immediate medical attention should they occur. Patient verbalized understanding of these instructions and education.  Patient Consent Given: Yes Education handout provided: Previously provided Muscles treated: bil lumbar multifidi thoracic multifidi Treatment response/outcome: Utilized skilled palpation to identify trigger points.  During dry needling able to palpate muscle twitch and muscle elongation  Elongation and release to low back after DN Skilled palpation and monitoring by PT during dry needling     PATIENT EDUCATION:  Education details: Access Code: [email or web address] (review from previous session). Discussed strategies to incorporate more movement into her day considering her depression.  Pt will work to implement these strategies.   Person educated: Patient Education method: Explanation, Demonstration, and Handouts Education comprehension: verbalized understanding and returned demonstration   HOME EXERCISE PROGRAM: Access Code: [email or web address]  ASSESSMENT:  CLINICAL IMPRESSION: Pt is really considering joining [place] in order to exercise in the water. She had been concerned about  the cost but is thinking it may be possible now. She has tolerated a large volume of aquatic exercises the last two sessions and did not suffer any negative effects. She is more than likely ready for independent aquatic exercise.  OBJECTIVE IMPAIRMENTS decreased activity tolerance, decreased endurance, difficulty walking, decreased strength, increased muscle spasms, impaired flexibility, improper body mechanics, postural dysfunction, and pain.   ACTIVITY LIMITATIONS carrying, lifting, sitting, standing, reach over head, hygiene/grooming, and locomotion level  PARTICIPATION LIMITATIONS: meal prep, cleaning, laundry, driving, community activity, and yard work  PERSONAL FACTORS Past/current experiences, Time since onset of injury/illness/exacerbation, and 3+ comorbidities: fibromyalgia depression, anxiety,   are also affecting patient's functional outcome.   REHAB POTENTIAL: Good  CLINICAL DECISION MAKING: Evolving/moderate complexity  EVALUATION COMPLEXITY: Moderate   GOALS: Goals reviewed with patient? Yes  SHORT TERM GOALS: Target date: 03/20/22  Return to regular performance of HEP 2-3x/day to improve mobility  Baseline: stretching 2x/day (02/28/22) Goal status: In progress  2.  Report > or = to 25% reduction in cervical and lumbar pain with ADLs and self-care  Baseline: 20% better overall (02/28/2022) Goal status: in progress   3.  Reduce fibromyalgia impact score to <  or = to 39  Baseline: 28 (02/28/22) Goal status: MET    LONG TERM GOALS: Target date: 04/17/22  Be independent in advanced HEP Baseline: gentle exercise  Goal status: In progress   2.  Reduce fibromyalgia impact questionnaire to < or = to 21 Baseline: 28 (02/28/22) Goal status: Revised   3.  Lift and carry laundry and her grandchildren with > or = to 50% reduction in pain Baseline: 20% better (02/28/22) Goal status: In progress   4.  Report > or = to 50% reduction in neck and lumbar pain with ADLs and self-care   Baseline: 20% better (02/28/22) Goal status: In progress   5.  Return to yardwork verbalize appropriate rest breaks and mechanics  Baseline: hasn't been doing due to being sick (02/28/22) Goal status: In progress    PLAN: PT FREQUENCY: 2x/week  PT DURATION: 8 weeks  PLANNED INTERVENTIONS: Therapeutic exercises, Therapeutic activity, Neuromuscular re-education, Balance training, Gait training, Patient/Family education, Self Care, Joint mobilization, Aquatic Therapy, Dry Needling, Spinal manipulation, Spinal mobilization, Cryotherapy, Moist heat, Taping, Traction, Manual therapy, and Re-evaluation  PLAN FOR NEXT SESSION:  manual as needed, manage exercises as needed. Encourage gentle mobility. Aquatics to manage chronic pain.    Myrene Galas, PTA 04/19/22 9:09 PM   [facility] [address] [place], [place] [address] Phone # [phone] Fax [phone]

## 2022-04-19 ENCOUNTER — Encounter: Payer: Self-pay | Admitting: Physical Therapy

## 2022-04-19 ENCOUNTER — Ambulatory Visit: Payer: Medicare Other | Admitting: Physical Therapy

## 2022-04-19 DIAGNOSIS — M353 Polymyalgia rheumatica: Secondary | ICD-10-CM | POA: Diagnosis not present

## 2022-04-19 DIAGNOSIS — M542 Cervicalgia: Secondary | ICD-10-CM

## 2022-04-19 DIAGNOSIS — R252 Cramp and spasm: Secondary | ICD-10-CM | POA: Diagnosis not present

## 2022-04-19 DIAGNOSIS — M6281 Muscle weakness (generalized): Secondary | ICD-10-CM

## 2022-04-19 DIAGNOSIS — R293 Abnormal posture: Secondary | ICD-10-CM | POA: Diagnosis not present

## 2022-04-20 ENCOUNTER — Ambulatory Visit: Payer: Medicare Other

## 2022-04-20 DIAGNOSIS — M6281 Muscle weakness (generalized): Secondary | ICD-10-CM

## 2022-04-20 DIAGNOSIS — M353 Polymyalgia rheumatica: Secondary | ICD-10-CM | POA: Diagnosis not present

## 2022-04-20 DIAGNOSIS — R252 Cramp and spasm: Secondary | ICD-10-CM | POA: Diagnosis not present

## 2022-04-20 DIAGNOSIS — M542 Cervicalgia: Secondary | ICD-10-CM | POA: Diagnosis not present

## 2022-04-20 DIAGNOSIS — R293 Abnormal posture: Secondary | ICD-10-CM | POA: Diagnosis not present

## 2022-04-20 NOTE — Therapy (Signed)
OUTPATIENT PHYSICAL THERAPY TREATMENT   Patient Name: Danielle Bruce MRN: [id number] DOB:26-Jan-1961, 62 y.o., female Today's Date: 04/20/2022        PT End of Session - 04/20/22 1147     Visit Number 30    Date for PT Re-Evaluation 06/16/22    Authorization Type Medicare B- needs KX  at 23 for 2024 (11 for 2024 on 04/20/22)    Progress Note Due on Visit 84    PT Start Time 1102    PT Stop Time 1147    PT Time Calculation (min) 45 min    Activity Tolerance Patient tolerated treatment well    Behavior During Therapy [facility] for tasks assessed/performed                                 Past Medical History:  Diagnosis Date   Abdominal pain, unspecified site 10/18/2012   Anemia    Anxiety    Arthritis    osteoarthritis   ASCUS (atypical squamous cells of undetermined significance) on Pap smear 05/06/2005   NEG HIGH RISK HPV--C&B BIOPSY BENIGN 12/2005   Asthma    Bipolar 2 disorder ([place])    Cancer ([place])    skin cancer - basal cell   Colon polyps    Complication of anesthesia    anxious afterwards, will get headaches    Constipation    Depression    Fibromyalgia 10/2013   GERD (gastroesophageal reflux disease)    Hearing loss on left    Heart murmur    never had any problems   Hemorrhoids    High cholesterol    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   cytology negative   IBS (irritable bowel syndrome)    Insomnia    Lymphocytic colitis    Lymphoma ([facility])    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   Bone marrow biopsy showes 8% plasma cells IgA Lambda   Migraines    Nausea alone 10/18/2012   Osteoarthritis    Osteopenia    Peripheral neuropathy    PTSD (post-traumatic stress disorder)    Past Surgical History:  Procedure Laterality Date   BONE MARROW BIOPSY Left 12/18/2013   Plasma cell dyscrasia 8% population of plasma cells   BREAST BIOPSY Right    benign stereo   CESAREAN SECTION  [id number]   CHOLECYSTECTOMY N/A  10/16/2012   Procedure: LAPAROSCOPIC CHOLECYSTECTOMY;  Surgeon: Joyice Faster. Cornett, MD;  Location: [facility];  Service: General;  Laterality: N/A;   COLONOSCOPY     numerous times   DILATION AND CURETTAGE OF UTERUS     ESOPHAGOGASTRODUODENOSCOPY     HEMORRHOID SURGERY  1993   x3   IUD REMOVAL  02/2015   Mirena   LAPAROSCOPIC LYSIS OF ADHESIONS N/A 10/16/2012   Procedure: LAPAROSCOPIC LYSIS OF ADHESIONS;  Surgeon: Marcello Moores A. Cornett, MD;  Location: [facility];  Service: General;  Laterality: N/A;   LAPAROSCOPY N/A 10/16/2012   Procedure: LAPAROSCOPY DIAGNOSTIC;  Surgeon: Joyice Faster. Cornett, MD;  Location: [facility];  Service: General;  Laterality: N/A;   PELVIC LAPAROSCOPY     RADIOLOGY WITH ANESTHESIA N/A 12/16/2015   Procedure: MRI OF BRAIN WITH AND WITHOUT CONTRAST;  Surgeon: Medication Radiologist, MD;  Location: [place];  Service: Radiology;  Laterality: N/A;   SHOULDER SURGERY  2007/2008   SPINE SURGERY  2010   cervical   Patient Active Problem List   Diagnosis Date  Noted   GAD (generalized anxiety disorder) 12/26/2017   OCD (obsessive compulsive disorder) 12/26/2017   PTSD (post-traumatic stress disorder) 12/26/2017   DDD (degenerative disc disease), cervical 07/14/2016   Primary osteoarthritis of both feet 07/14/2016   Primary osteoarthritis of both hands 07/14/2016   Other fatigue 07/14/2016   History of IBS 07/14/2016   Osteopenia of multiple sites 07/14/2016   Fibromyalgia 01/13/2016   MGUS (monoclonal gammopathy of unknown significance) 12/23/2013   Chronic cholecystitis without calculus 10/18/2012   Abdominal pain, unspecified site 10/18/2012   Nausea alone 10/18/2012   Unspecified constipation 10/18/2012   Depression 10/18/2012   Anxiety    ASCUS (atypical squamous cells of undetermined significance) on Pap smear    IUD    Hemorrhoids 11/29/2010   Abdominal pain, left upper quadrant 11/29/2010    PCP:  Mayra Neer, MD  REFERRING PROVIDER: Mayra Neer, MD  REFERRING  DIAG: fibromyalgia, TMJ dysfunction (added 04/20/22)  THERAPY DIAG:  Abnormal posture - Plan: PT plan of care cert/re-cert  Muscle weakness (generalized) - Plan: PT plan of care cert/re-cert  Cervicalgia - Plan: PT plan of care cert/re-cert  Polymyalgia rheumatica ([place]) - Plan: PT plan of care cert/re-cert  Cramp and spasm - Plan: PT plan of care cert/re-cert  Rationale for Evaluation and Treatment Rehabilitation  ONSET DATE: chronic pain (fibromyalgia) with flare-up 2 months ago  SUBJECTIVE:                                                                                                                                                                                                         SUBJECTIVE STATEMENT:  I am sore in my neck today.  The pool is really helping.  30-45% overall improvement.  MD sent over order to treat TMJ  PERTINENT HISTORY:  Anxiety, bipolar disorder, depression, fibromyalgia, IBS, migraines, osteopenia, PTSD  PAIN:  Are you having pain?Yes 5/10 Pain location: whole body, stress, fatigue TMJ 6/10- chewing sometimes just hurts  Pain description: sore, tight  Aggravating factors: activity, yard work, picking up heavy objects Relieving factors: muscle relaxers, rest, stretching  PRECAUTIONS: Other: chronic pain syndrome and depression Other: slow progression with exercise due to chronic condition  WEIGHT BEARING RESTRICTIONS No  FALLS:  Has patient fallen in last 6 months? No  LIVING ENVIRONMENT: Lives with: lives with their family Lives in: House/apartment  OCCUPATION: on disability  PLOF: Independent with basic ADLs and Leisure: yardwork, walking  Pt cares for her 28 young grandchildren- difficulty with care including lifting and carrying  PATIENT GOALS be more active with less  pain, exercise at the gym regularly, lift and carry grandchildren and laundry, sleep with fewer interruptions, get back to more gardening.  OBJECTIVE:   DIAGNOSTIC  FINDINGS: none recent   PATIENT SURVEYS: Eval: Fibromyalgia Impact Questionnaire: first 2 sections only: 49 12/26/21: 40  02/28/22: 28 (first 2 sections only)  COGNITION: Overall cognitive status: Within functional limits for tasks assessed  SENSATION: WFL  POSTURE: rounded shoulders and forward head  PALPATION: Diffuse palpable tenderness over bil neck, upper traps, thoracic, lumbar and gluteals with trigger points.    CERVICAL ROM:   Active ROM A/PROM (deg) eval A/ROM 12/26/21 A/ROM 04/20/22  Flexion 55    Extension 40    Right lateral flexion 30 35 40  Left lateral flexion 35 40 45  Right rotation 50 60 65  Left rotation 40 65 65   (Blank rows = not tested) TMJ: opening limited by [id number], clicking with opening on the Rt.  Palpable tenderness over Rt masseter and at TMJ UPPER EXTREMITY ROM: UE A/ROM is limited by 20% into flexion and abduction with pain at end range.  Hip flexibility is limited by 25% in all directions with pain in all directions.  UPPER EXTREMITY MMT: UE: 4+/5, LE 4+/5 except hip flexors 4-/5  TODAY'S TREATMENT:  Date: 04/20/22 NuStep: Level 3 x 10 minutes for endurance and mobility-discussed TMJ dysfunction Ball roll outs forward and lateral x10 each TMJ exercises: controlled opening and protraction Trigger Point Dry-Needling  Treatment instructions: Expect mild to moderate muscle soreness. S/S of pneumothorax if dry needled over a lung field, and to seek immediate medical attention should they occur. Patient verbalized understanding of these instructions and education.  Patient Consent Given: Yes Education handout provided: Previously provided Muscles treated: bil cervical and lumbar multifidi, bil upper traps  Treatment response/outcome: Utilized skilled palpation to identify trigger points.  During dry needling able to palpate muscle twitch and muscle elongation  Elongation and release to bil neck after DN Skilled palpation and monitoring by PT  during dry needling   04/19/22:Pt arrives for aquatic physical therapy. Treatment took place in 3.5-5.5 feet of water. Water temperature was 91 degrees F. Pt entered the pool via stairs reciprocally with mild use of rails. Pt requires buoyancy of water for support and to offload joints with strengthening exercises. Pt utilizes viscosity of the water required for strengthening. Seated water bench with 75% submersion Pt performed seated LE AROM exercises 20x in all planes,   Standing in 50%- 75% depth water pt performed water walking in all 4 directions 10x. VC for speed in order to generate appropriate current for resistance and/or UE movements. Water bells and UE single buoy wts used for push/pull with UE. 50% depth standing: knee ext Bil 15x2 with small noodle, single buoy UE wt push and hold with 6 lengths of high knee marching, hip 3 ways with ankle fins 20x Bil NO UE, core/lat press 20x with double buoy wts, 10 min horseback bicycle on yellow noodle.   04/12/22:Pt arrives for aquatic physical therapy. Treatment took place in 3.5-5.5 feet of water. Water temperature was 91 degrees F. Pt entered the pool via stairs reciprocally with mild use of rails. Pt requires buoyancy of water for support and to offload joints with strengthening exercises. Pt utilizes viscosity of the water required for strengthening. Seated water bench with 75% submersion Pt performed seated LE AROM exercises 20x in all planes,   Standing in 50%- 75% depth water pt performed water walking in all 4 directions 10x.  VC for speed in order to generate appropriate current for resistance and/or UE movements. Water bells and UE single buoy wts used for push/pull with UE. 50% depth standing: knee ext Bil 10x2 with small noodle, single buoy UE wt push and hold with 6 lengths of high knee marching, hip 3 ways with ankle fins 20x Bil NO UE, core/lat press 20x with single buoy wts, 10 min horseback bicycle on yellow noodle.    HOME EXERCISE  PROGRAM: Access Code: [email or web address] Access Code: [email or web address] URL: [email or web address] Date: 04/20/2022 Prepared by: Claiborne Billings  Exercises - - Controlled Jaw Opening with Tongue on Roof of Mouth  - 3 x daily - 7 x weekly - 1 sets - 10 reps - Jaw Protraction  - 3 x daily - 7 x weekly - 1 sets - 10 reps ASSESSMENT:  CLINICAL IMPRESSION: Pt reports 35-40% overall improvement in symptoms with land and pool therapy.  Pt has new diagnosis of Rt TMJ and PT assessed and added HEP for this.  Pt did well with gentle mobility exercises today and had good response to DN with twitch response and improved tissue mobility after manual therapy today.   Pt with chronic pain and is responding well, yet slowly to PT at this time. She is able to be more active with reduced pain with care of herself and her young grandchildren.  Patient will benefit from skilled PT to address the below impairments and improve overall function.   OBJECTIVE IMPAIRMENTS decreased activity tolerance, decreased endurance, difficulty walking, decreased strength, increased muscle spasms, impaired flexibility, improper body mechanics, postural dysfunction, and pain.   ACTIVITY LIMITATIONS carrying, lifting, sitting, standing, reach over head, hygiene/grooming, and locomotion level  PARTICIPATION LIMITATIONS: meal prep, cleaning, laundry, driving, community activity, and yard work  PERSONAL FACTORS Past/current experiences, Time since onset of injury/illness/exacerbation, and 3+ comorbidities: fibromyalgia depression, anxiety,   are also affecting patient's functional outcome.   REHAB POTENTIAL: Good  CLINICAL DECISION MAKING: Evolving/moderate complexity  EVALUATION COMPLEXITY: Moderate   GOALS: Goals reviewed with patient? Yes  SHORT TERM GOALS: Target date: 03/20/22  Return to regular performance of HEP 2-3x/day to improve mobility  Baseline: stretching 2x/day (02/28/22) Goal status: In progress  2.  Report > or = to 25%  reduction in cervical and lumbar pain with ADLs and self-care  Baseline: 20% better overall (02/28/2022) Goal status: in progress   3.  Reduce fibromyalgia impact score to < or = to 39  Baseline: 28 (02/28/22) Goal status: MET    LONG TERM GOALS: Target date: 06/16/22  Be independent in advanced HEP Baseline: gentle exercise  Goal status: In progress   2.  Reduce fibromyalgia impact questionnaire to < or = to 21 Baseline: 28 (02/28/22) Goal status: Revised   3.  Lift and carry laundry and her grandchildren with > or = to 50% reduction in pain Baseline: 20% better (02/28/22) Goal status: In progress   4.  Report > or = to 60% reduction in neck and lumbar pain with ADLs and self-care  Baseline: 30-45% better (04/20/22) Goal status: revised  5.  Return to yardwork verbalize appropriate rest breaks and mechanics  Baseline: hasn't been doing due to being sick (02/28/22) Goal status: In progress    6. Report > or = to 30% reduction in Rt jaw pain with chewing and talking   Baseline 6/10   Goal Status: NEW PLAN: PT FREQUENCY: 2x/week  PT DURATION: 8 weeks  PLANNED INTERVENTIONS: Therapeutic exercises, Therapeutic activity, Neuromuscular re-education, Balance training,  Gait training, Patient/Family education, Self Care, Joint mobilization, Aquatic Therapy, Dry Needling, Spinal manipulation, Spinal mobilization, Cryotherapy, Moist heat, Taping, Traction, Manual therapy, and Re-evaluation  PLAN FOR NEXT SESSION:  manual as needed, manage exercises as needed. Encourage gentle mobility. Aquatics to manage. chronic pain.  Treat TMJ  Sigurd Sos, PT 04/20/22 11:55 AM   [facility] [address], [place], [place] [address] Phone # [phone] Fax [phone]

## 2022-04-26 ENCOUNTER — Encounter: Payer: Self-pay | Admitting: Psychiatry

## 2022-04-26 ENCOUNTER — Ambulatory Visit (INDEPENDENT_AMBULATORY_CARE_PROVIDER_SITE_OTHER): Payer: Medicare Other | Admitting: Psychiatry

## 2022-04-26 DIAGNOSIS — F902 Attention-deficit hyperactivity disorder, combined type: Secondary | ICD-10-CM

## 2022-04-26 DIAGNOSIS — F5105 Insomnia due to other mental disorder: Secondary | ICD-10-CM

## 2022-04-26 DIAGNOSIS — F331 Major depressive disorder, recurrent, moderate: Secondary | ICD-10-CM | POA: Diagnosis not present

## 2022-04-26 DIAGNOSIS — F4310 Post-traumatic stress disorder, unspecified: Secondary | ICD-10-CM

## 2022-04-26 DIAGNOSIS — F4001 Agoraphobia with panic disorder: Secondary | ICD-10-CM | POA: Diagnosis not present

## 2022-04-26 DIAGNOSIS — F428 Other obsessive-compulsive disorder: Secondary | ICD-10-CM | POA: Diagnosis not present

## 2022-04-26 DIAGNOSIS — F5081 Binge eating disorder: Secondary | ICD-10-CM | POA: Diagnosis not present

## 2022-04-26 DIAGNOSIS — F411 Generalized anxiety disorder: Secondary | ICD-10-CM

## 2022-04-26 MED ORDER — LISDEXAMFETAMINE DIMESYLATE 40 MG PO CAPS: 40.0000 mg | ORAL_CAPSULE | Freq: Every day | ORAL | 0 refills | Status: DC

## 2022-04-26 MED ORDER — LORAZEPAM 1 MG PO TABS: 1.0000 mg | ORAL_TABLET | Freq: Three times a day (TID) | ORAL | 1 refills | Status: DC | PRN

## 2022-04-26 NOTE — Progress Notes (Signed)
Danielle Bruce [id number] 1961/01/05 62 y.o.   Subjective:   Patient ID:  Danielle Bruce is a 62 y.o. (DOB [birth year]) female.  Chief Complaint:  Chief Complaint  Patient presents with   Follow-up    HPI KATURAH SULEK presents for follow-up of multiple dxes.  visit in April and taken off lamotrigine DT possible skin reaction.  Had appt with [facility]ology and they assume that skin reaction with itching was related to lamotrigine. 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 Per oncology most likely drug is lamotrigine. And she weaned off of the medication to determine if the cutaneous lesions are related. Lesions seem less without lamotrigine.  At this point skin reaction is presumed related to lamotrigine.  No other disease sx. She is well aware that she has been on lamotrigine for many years with good results for depression control. There is a risk of relapse as she comes off of the medication and she is worried about that. Smart phone helps her cognitive  And other productivity.    seen October 2020.  Restarted low dosage ADD bc concerns over STM, concentration and productivity.  Ritalin 5 mg twice daily.  Also suggested she retry N-acetylcysteine for mild cognitive complaints.   seen April 07, 2019 the following was noted: Mostly good.  Anxiety is mostly better, but depression is a little worse seasonally.  Not able to go help her daughter.   Low energy but does have motivation.  Ritalin helps energy and focus but doesn't take it regularly bc irregular sleep cycle.   Ativan only when having medical issues.    Therapy working on trauma issues is very hard.  Struggling with the fact she doesn't have memory of large parts of her past.   No manic sx off mood stabilizers.  Covid isolation reduce stressors overall and not prominently depressed.   No meds were  changed.  June 25, 2019 appointment the following is noted: F real worried about Covid despite vaccination.   Started fluvoxamine 25 on May 14, 2019 from PCP mainly for inflammation but possibly thinking she might be depressed. Seen benefit for energy and sleep pattern and more appropriate feelings and less numb.  Now realizes she had been depressed for awhile but just numb with depression.  Anniversaries of deaths of friends including last BF [name] a little over a year ago.   Thinks of him daily. His 60 yo D died of unknown cause recently.  Stress D moved to beach. Wonders about increasing the fluvoxamine  [name] living in [place] and hasn't seen him in a year. [name] on disability for depression. Patient reports more depression.  Plan: Realized lately she's depressed and started Luvox again for it and for the anti-inflammatory potential for her health.  Has seen some benefit.   Prior benefit at 25 mg daily.  Optiion increase but slightly bc med sensitive. AGREE increase fluvoxamine to 37.5 mg daily.  08/06/2019 appointment with the following noted: Increased fluvoxamine to 37.5 mg and it helped the depression. Could see a big difference and had been more depressed than she even realized. Outlook better and sleep pattern normalized.    More energy and motivation.   But also started having prolonged HA.  HA lasted a couple of weeks.   Assumed HA was DT increased fluvoxamine so reduced to 25 mg on 07/28/19.  07/29/19 Episode of confusion occurred also.  Went to doctor while confused.  They couldn't determine cause.    Resolved in 1 day. FM flare up since fall/winter. To beach with daughter 3 mos minimum to give her shots for IVF treatment. Patient reports panic recently and recent difficulty with anxiety.    Occ flashbacks.   Patient denies difficulty with sleep initiation or maintenance. Denies appetite disturbance.  Patient reports that energy and motivation have been good.  Patient denies any  difficulty with concentration.  Patient denies any suicidal ideation. Plan: AGREE increase fluvoxamine to 37.5 mg daily once HA is controlled for several weekcs.  01/01/20 appt with the following noted: Increased fluvoxamine for awhile but got more HA and reduced it. Anxiety is better in general now.  But is interested in increasing it DT coming winter. When anxiety is not good it catches her off guard. Likely had Covid in August but early test was negative.  Got prednisone but sick for 5 weeks.  Lost taste and smell still going on.  D also had Covid.  Felt really sick and terrified with bad mental health at the time.  Was traumatic for her.  Had been vaccinated.   Had panic attack at the ER and took a long time to get the Ativan.  D moving back to area and pt will have to help with childcare so hopes to more aggressively treat her health problems. 2 gkids and 2 more coming. [name] married without kids yet. Plan: started Luvox again for it and for the anti-inflammatory potential for her health.  Has seen some benefit.   Prior benefit at 25 mg daily.  Optiion increase but slightly bc med sensitive. AGREE increase fluvoxamine to 37.5 mg daily once HA is controlled for several weeks.  03/03/2020 appointment with the following noted: On Day 10 of Covid.  2nd bout. A lot easier this time. Had gotten Luvox up to 50 mg daily and tolerated.  Then doubled it to 100 mg daily. Wonders about the proper dose for maintenance.   No taste and smell, exhausted, congestion and mild cough.  Energy is better than it was in the beginning.  Made her depressed and anxious again and that part is better also.  Last time had to go to ER with Covid.  Only ER visit in 2-3 years.  Less migraine than in the past. Overall was doing better with last couple months.  Trying to do anti-inflammatory diet.  Vegetarian, no dairy or gluten.  Natural sugars.   Still some anxiety. D moved back here and pregnant with 2nd set of twins.  Current twins are 60 yo.   Will restart trauma therapy and it's helped over the last couple of years.  More freedom from things.      Less daily dread with anxiety but still some panic which can be triggered.  Less OCD with fluvoxamine unless triggered.   Taking Ritalin 10 mg each AM and tolerating and benefiting. occ NM with few dreams overall. Plan: started Luvox again for it and for the anti-inflammatory potential for her health.  Has seen some benefit.   Prior benefit at 25 mg daily.  Med sensitive. AGREE increase fluvoxamine to  50  mg daily. Up to 100 mg daily if desired.   04/22/19 appt with following noted: Mostly good and randomly psycho.  Really busy with Colletta [place] [redacted] weeks pregnant with twins and twins 60 yo at home.  She's helping and her H is away. Mo older and slower.  Pt overworking.  When with Colletta [place] kids interfere with sleep.  Doesn't eat as well with Colletta [place] affects her FM and energy.   Recently exhausted and needed rest but couldn't go home DT D had to go to hospital.  Her H critical at times and she lost it emotionally over this. Almost suicidal reaction with weird intrusive thoughts in response to the criticism.   Got a break and it helped.   Rheum adjusted Lyrica to BID. Had accident fall with one of babies and was bleeding.  She became hysterical.   Upset now that she couldn't control her emotions at the time.  Couldn't get herself under control to deal with the crisis.    Overall doing ok but doesn't handle stressors well and lose it.   Plan: AGREE with increase fluvoxamine to  50  mg daily. Up to 100 mg daily if desired.  06/23/2020 appointment with the following noted: Increased fluvoxamine 25 AM and 50 mg PM and tolerated it.  Been on this dose awhile.  Going to [place] May 6-19 and son taking her.  Working for [employer] and been successful. Also going to [place], [place].  Excited.   Occ overwhelmed. Starting therapy with Lonn Georgia and looking forward to it and dealing  with trauma stuff. Nervous about doing the therapy and getting decompensated like what happened last time. Prednisone hellps mood and body. Not as depressed as in the past.  Sleep is reasonably good.  Appetite is normal.  Anxiety intermittent.  No suicidal thoughts Plan: No med changes  08/19/2020 appointment with the following noted: Not good.  Dog died 2 week ago and was very close to her for 16 years.  She had been a great comfort during losses and health problems.  Dog helped her through periods of SI. Exhausted. [name] in [place] and going in early July. Wonders how long this will last.  Also increased fluvoxamine and now out and having withdrawal. Disc difference between grief and depression. Gwyndolyn Saxon needs therapist who takes Medicaid. Plan: no med changes  01/10/21 appt noted: Had covid 3rd time. Still on fluvox 50, Ritalin 10 mg daily, Deplin, trazodone 50 mg HS. Helping with gkids and been sick a lot since September. Been doing so so overall.  At times is good. Son-in-law will be getting raise and D can quit work for a year or so. [name] and his wife in [place] and that's good. Parents getting old. Both parents hearing problems and M more forgetful. Parents personality different.  Less therapy lately bc schedules.  Has dealt with a lot of trauma and will resume after new year and feels it has been helpful.  04/13/21 appt noted: More problems with appetite increase for a couple of years.  Always hungry.  Wonders about changing stimulant for this.  Weight up too much. Needs to lose 15+ # DT chronic pain including hips and knees. More depression.  Seasonally.  Episodic panic and anxiety chronic. Some intermittent conflict with daughter.   Needs Ativan intermittently. Still working through trauma issues. Continues therapy.   Haven't gotten back into church.  Not wanting to go out much. Plan: Benefit  Ritalin 10 mg AM and noon for cognitive and ADD reasons and disc risk of palpitations.   Per request ok trial Vyvanse for binge eating and ADD instead of Ritalin per PCP suggestion May also help mood and weight loss should help pain. Caution it could cause anxiety  06/06/2021 appointment with the following noted: Did get a prescription for Vyvanse 30 mg  on 04/28/2021 filled.  Tried that in place of Ritalin. Better energy and focus with Vyvanse and less binge eating and lost 7-8 #.  Helped lose prednisone weight from November. Seems to wear off in 5-6 hours.   In afternoon when it wears off just wants to sit around.  No sig SE. Thinks she is still a little depressed and not enjoying things like she should. Chronic colitis with recent flare.   To [place] in May for a couple of weeks with son and his wife. Plan: Per request ok trial Vyvanse for binge eating and ADD and increase  to 40 mg daily per PCP suggestion  08/11/2021 appointment with the following noted: Tolerating increase Vyvanse 40 AM.  No SE.   Less crash with it and longer duration. Lost 14#.  Less craving.   Still kind of depressed.  B moved back after divorce.  F mad about it.  M health problems. Home more stressful.   Went to [place]. M acting odd and F cognitive problems. Poor sleep at home bc temp is so warm for parents. OC sx a little worse but not severe. Plan: Better with Vyvanse for binge eating and ADD and increase  to 40 mg daily per PCP suggestion May also help mood and weight loss should help pain. Continue fluvoxamine 50 mg twice daily  11/16/2021 appointment noted: Helping with gkids. Son in Sports coach still working out of state.  F not doing well with cognition and falls. 62yo. Handling stressors pretty well.  When irritable then takes a break. Enjoys grandkids 4 of them. D therapist and not working right now other than taking care of kids. Restarted PT which has helped in the past with pain and limitations physically. Starting therapy with Caroline Sauger end of October. sTill taking vyvanse 40 and  fluvoxamine 50 mg BID. Has missed some Vyvanse bc some days sleeping late. More productive with Vyvanse 40 mg AM, but still procrastinates. Can read again better and sustain attention better but not as productive as she would like. Still a fair amount of anxiety easily triggered anxiety at times and hopes therapy will help.  Claustrophobic easily with panic triggered. Depression is OK lately.  02/16/22 appt noted: Had asthma episode and had to leave.  She was not in severe distress and was able to verbalize that she could drive herself home.  04/26/22 appt noted: Doing well.  Overall.  If gets up late then skips vyvanse and then is sleepy.   Back and forth between [facility] and her house where temp is high for mother.  B at her house and always cold.  No HA with Stephanie's.   F 04/24/22 died with dementia.  Declined quickly.  Didn't have to be placed.  Has been ok.  Gkids talk about him.  M doing pretty well and grateful he passed before being placed.   Eating healthy.   Asks whether needs to continue fluvoxamine.   Still on fluvoxamine 50 BID, Vyvanse 40 AM, trazdone 50 MG Hs Mood is good. Counseling with Bambi Cottle is already helpful. Still some anxiety but getting better recognizing triggers.  Can catastrophixize and working on CBT.  Still episodes out of the blue. Energy good with Vyvanse. Sleep is ok at D's.  Past psych meds: Lithium, carbamazepine, oxcarbazepine, topiramate, valproic acid, lamotrigine, gabapentin  Zyprexa, Seroquel, Latuda headaches, Geodon,  Risperidone, Stelazine,  Rexulti,  Wellbutrin with side effects,  sertraline irritability, duloxetine,  fluvoxamine 100 with Covid, fluoxetine, Stopped desipramine DT skin  issues which now resolved.  It helped GI px.  buspirone side effects,   N-acetylcysteine,  pramipexole,  methylphenidate,  Adderall,  Vyvanse 40 Ambien with amnesia, trazodone, Ativan  Review of Systems:  Review of Systems  HENT:  Negative for  congestion.   Gastrointestinal:  Positive for abdominal pain. Negative for nausea.  Musculoskeletal:  Positive for back pain and myalgias.  Neurological:  Positive for headaches. Negative for tremors and weakness.  Psychiatric/Behavioral:  Negative for dysphoric mood. The patient is nervous/anxious.   Less HA with new meds.  Medications: I have reviewed the patient's current medications.  Current Outpatient Medications  Medication Sig Dispense Refill   albuterol (PROVENTIL HFA;VENTOLIN HFA) 108 (90 Base) MCG/ACT inhaler Inhale 2 puffs into the lungs every 6 (six) hours as needed for wheezing or shortness of breath.     Calcium Carb-Cholecalciferol (CALCIUM 1000 + D) 1000-20 MG-MCG TABS Take by mouth.     cyclobenzaprine (FLEXERIL) 10 MG tablet Take 10 mg by mouth at bedtime as needed.     diphenhydrAMINE (BENADRYL) 25 mg capsule Take 50 mg by mouth every 6 (six) hours as needed (HEADACHES).     Erenumab-aooe 70 MG/ML SOAJ Inject into the skin every 28 (twenty-eight) days.     fluvoxaMINE (LUVOX) 50 MG tablet TAKE 1 TABLET(50 MG) BY MOUTH TWICE DAILY 180 tablet 1   hydrocortisone (ANUSOL-HC) 25 MG suppository 1 suppository     ibuprofen (ADVIL) 800 MG tablet 1 tablet with food or milk as needed     L-methylfolate Calcium 15 MG TABS TAKE 1 TABLET BY MOUTH DAILY 30 tablet 11   levocetirizine (XYZAL) 5 MG tablet Take 5 mg by mouth every evening.  5   [START ON 06/21/2022] lisdexamfetamine (VYVANSE) 40 MG capsule Take 1 capsule (40 mg total) by mouth daily. 30 capsule 0   methocarbamol (ROBAXIN) 500 MG tablet Take 1 tablet (500 mg total) by mouth daily as needed for muscle spasms. 30 tablet 1   montelukast (SINGULAIR) 10 MG tablet Take 10 mg by mouth at bedtime.     NURTEC 75 MG TBDP Take 1 tablet by mouth daily as needed.     oxyCODONE-acetaminophen (PERCOCET/ROXICET) 5-325 MG per tablet Take 2 tablets by mouth daily as needed for moderate pain or severe pain (mighraine.).      polyethylene  glycol (MIRALAX / GLYCOLAX) packet Take 17 g by mouth daily as needed for mild constipation.     pravastatin (PRAVACHOL) 40 MG tablet 1 tablet     pregabalin (LYRICA) 50 MG capsule Take 100 mg by mouth at bedtime. 1 tab in am and 1 tab HS.     PROLIA 60 MG/ML SOSY injection BRING TO THE OFFICE FOR INJECTION ON 02/15/2018 AS DIRECTED ONCE EVERY 6 MONTHS     promethazine (PHENERGAN) 25 MG tablet Take 25 mg by mouth every 6 (six) hours as needed for nausea.     traZODone (DESYREL) 50 MG tablet TAKE 1 TABLET(50 MG) BY MOUTH AT BEDTIME 90 tablet 1   VASCEPA 1 g CAPS TK 2 CS PO BID WC  3   VITAMIN D PO Take by mouth. Take '5000mg'$  daily     VOLTAREN 1 % GEL Apply 2 g topically 4 (four) times daily as needed (JOINT PAIN).   3   Wheat Dextrin (BENEFIBER PO) 1 Tablespoon     EPINEPHrine 0.3 mg/0.3 mL IJ SOAJ injection      fluticasone (FLONASE) 50 MCG/ACT nasal spray Place 2 sprays into both nostrils  daily. 16 g 0   lisdexamfetamine (VYVANSE) 40 MG capsule Take 1 capsule (40 mg total) by mouth daily. 30 capsule 0   [START ON 05/24/2022] lisdexamfetamine (VYVANSE) 40 MG capsule Take 1 capsule (40 mg total) by mouth daily. 30 capsule 0   LORazepam (ATIVAN) 1 MG tablet Take 1 tablet (1 mg total) by mouth every 8 (eight) hours as needed for anxiety. (takes 2 mg when having a medical procedure.) 30 tablet 1   LORazepam (ATIVAN) 2 MG/ML concentrated solution Take 0.5 mLs (1 mg total) by mouth every 8 (eight) hours as needed for anxiety (severe panic). 30 mL 1   No current facility-administered medications for this visit.    Medication Side Effects: None  Allergies:  Allergies  Allergen Reactions   Sulfa Antibiotics Nausea And Vomiting    Causes pt to vomit blood Other reaction(s): vomiting blood   Gluten Meal Other (See Comments)    Sensitivity.    Lactose Intolerance (Gi) Nausea And Vomiting   Aspirin     Other reaction(s): colitis   Bromfed Dm [Pseudoeph-Bromphen-Dm]     Other reaction(s): strung  out and hallucinating   Chlorpromazine     Other reaction(s): Dark thoughts   Desipramine     Other reaction(s): rash   Gabapentin     Other reaction(s): swelling / fuzzy vision   Hydrocodone     Other reaction(s): causes hangovers   Lamotrigine     Other reaction(s): rash   Metronidazole     Other reaction(s): vomiting blood   Morphine Sulfate     Other reaction(s): tearful   Nsaids     Other reaction(s): colitis   Flagyl [Metronidazole Hcl] Nausea And Vomiting   Morphine And Related Other (See Comments)    Reaction: Depression, emotional    Past Medical History:  Diagnosis Date   Abdominal pain, unspecified site 10/18/2012   Anemia    Anxiety    Arthritis    osteoarthritis   ASCUS (atypical squamous cells of undetermined significance) on Pap smear 05/06/2005   NEG HIGH RISK HPV--C&B BIOPSY BENIGN 12/2005   Asthma    Bipolar 2 disorder ([facility])    Cancer ([facility])    skin cancer - basal cell   Colon polyps    Complication of anesthesia    anxious afterwards, will get headaches    Constipation    Depression    Fibromyalgia 10/2013   GERD (gastroesophageal reflux disease)    Hearing loss on left    Heart murmur    never had any problems   Hemorrhoids    High cholesterol    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   cytology negative   IBS (irritable bowel syndrome)    Insomnia    Lymphocytic colitis    Lymphoma (HCC)    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   Bone marrow biopsy showes 8% plasma cells IgA Lambda   Migraines    Nausea alone 10/18/2012   Osteoarthritis    Osteopenia    Peripheral neuropathy    PTSD (post-traumatic stress disorder)     Family History  Problem Relation Age of Onset   Diabetes Father    Hypertension Father    Hyperlipidemia Father    Depression Daughter    Anxiety disorder Daughter    Asthma Daughter    Heart disease Maternal Grandfather    Heart disease Paternal Grandmother    Heart disease Paternal  Grandfather    Depression Son    Depression Son  Anxiety disorder Son    Breast cancer Neg Hx     Social History   Socioeconomic History   Marital status: Divorced    Spouse name: Not on file   Number of children: 3   Years of education: Not on file   Highest education level: Not on file  Occupational History   Not on file  Tobacco Use   Smoking status: Former    Types: Cigarettes    Quit date: 05/13/2014    Years since quitting: 7.9   Smokeless tobacco: Never   Tobacco comments:    social   Vaping Use   Vaping Use: Never used  Substance and Sexual Activity   Alcohol use: Yes    Alcohol/week: 0.0 standard drinks of alcohol    Comment: rare   Drug use: Never   Sexual activity: Not Currently    Comment: -1st intercourse 62 yo-More than 5 partners  Other Topics Concern   Not on file  Social History Narrative   Not on file   Social Determinants of Health   Financial Resource Strain: Not on file  Food Insecurity: Not on file  Transportation Needs: Not on file  Physical Activity: Not on file  Stress: Not on file  Social Connections: Not on file  Intimate Partner Violence: Not on file    Past Medical History, Surgical history, Social history, and Family history were reviewed and updated as appropriate.   Please see review of systems for further details on the patient's review from today.   Objective:   Physical Exam:  LMP 12/28/2010   Physical Exam Constitutional:      General: She is not in acute distress. Musculoskeletal:        General: No deformity.  Neurological:     Mental Status: She is alert and oriented to person, place, and time.     Coordination: Coordination normal.  Psychiatric:        Attention and Perception: Attention and perception normal. She does not perceive auditory or visual hallucinations.        Mood and Affect: Mood is anxious. Mood is not depressed. Affect is not labile, angry, tearful or inappropriate.        Speech: Speech  normal.        Behavior: Behavior normal.        Thought Content: Thought content normal. Thought content is not paranoid or delusional. Thought content does not include homicidal or suicidal ideation. Thought content does not include suicidal plan.        Cognition and Memory: Cognition and memory normal.        Judgment: Judgment normal.     Comments: Insight intact Depression  better Affect better      Lab Review:     Component Value Date/Time   NA 142 08/01/2016 1713   NA 144 07/12/2015 1521   K 4.7 08/01/2016 1713   K 3.8 07/12/2015 1521   CL 107 08/01/2016 1713   CO2 23 08/01/2016 1713   CO2 27 07/12/2015 1521   GLUCOSE 95 08/01/2016 1713   GLUCOSE 82 07/12/2015 1521   BUN 14 08/01/2016 1713   BUN 10.3 07/12/2015 1521   CREATININE 0.87 08/01/2016 1713   CREATININE 0.8 07/12/2015 1521   CALCIUM 10.0 08/01/2016 1713   CALCIUM 9.9 07/12/2015 1521   PROT 7.4 08/01/2016 1713   PROT 7.4 07/12/2015 1521   ALBUMIN 4.8 08/01/2016 1713   ALBUMIN 4.4 07/12/2015 1521   AST 23 08/01/2016 1713  AST 22 07/12/2015 1521   ALT 26 08/01/2016 1713   ALT 31 07/12/2015 1521   ALKPHOS 55 08/01/2016 1713   ALKPHOS 54 07/12/2015 1521   BILITOT 0.7 08/01/2016 1713   BILITOT 0.40 07/12/2015 1521   GFRNONAA 76 08/01/2016 1713   GFRAA 88 08/01/2016 1713       Component Value Date/Time   WBC 5.9 08/01/2016 1713   RBC 4.71 08/01/2016 1713   HGB 14.6 08/01/2016 1713   HGB 14.1 07/12/2015 1521   HCT 43.1 08/01/2016 1713   HCT 41.5 07/12/2015 1521   PLT 254 08/01/2016 1713   PLT 248 07/12/2015 1521   MCV 91.5 08/01/2016 1713   MCV 92.2 07/12/2015 1521   MCH 31.0 08/01/2016 1713   MCHC 33.9 08/01/2016 1713   RDW 13.2 08/01/2016 1713   RDW 13.3 07/12/2015 1521   LYMPHSABS 2,006 08/01/2016 1713   LYMPHSABS 1.8 07/12/2015 1521   MONOABS 354 08/01/2016 1713   MONOABS 0.7 07/12/2015 1521   EOSABS 118 08/01/2016 1713   EOSABS 0.1 07/12/2015 1521   BASOSABS 0 08/01/2016 1713    BASOSABS 0.0 07/12/2015 1521    No results found for: "POCLITH", "LITHIUM"   No results found for: "PHENYTOIN", "PHENOBARB", "VALPROATE", "CBMZ"   .res Assessment: Plan:    Leitha was seen today for follow-up.  Diagnoses and all orders for this visit:  PTSD (post-traumatic stress disorder)  Generalized anxiety disorder -     LORazepam (ATIVAN) 1 MG tablet; Take 1 tablet (1 mg total) by mouth every 8 (eight) hours as needed for anxiety. (takes 2 mg when having a medical procedure.)  Major depressive disorder, recurrent episode, moderate (HCC)  Attention deficit hyperactivity disorder (ADHD), combined type -     lisdexamfetamine (VYVANSE) 40 MG capsule; Take 1 capsule (40 mg total) by mouth daily. -     lisdexamfetamine (VYVANSE) 40 MG capsule; Take 1 capsule (40 mg total) by mouth daily. -     lisdexamfetamine (VYVANSE) 40 MG capsule; Take 1 capsule (40 mg total) by mouth daily.  Insomnia due to mental condition  Obsessional thoughts  Panic disorder with agoraphobia -     LORazepam (ATIVAN) 1 MG tablet; Take 1 tablet (1 mg total) by mouth every 8 (eight) hours as needed for anxiety. (takes 2 mg when having a medical procedure.)  Binge-eating disorder, moderate -     lisdexamfetamine (VYVANSE) 40 MG capsule; Take 1 capsule (40 mg total) by mouth daily. -     lisdexamfetamine (VYVANSE) 40 MG capsule; Take 1 capsule (40 mg total) by mouth daily. -     lisdexamfetamine (VYVANSE) 40 MG capsule; Take 1 capsule (40 mg total) by mouth daily.   Past couple of years more obvious PTSD obvious has called into doubt the bipolar dix bc she's remained mood stable off the lamotrigine.    She has had symptoms consistent with hypomania in the past but there is now some question as to whether they were PTSD related rather than truly bipolar 2.  Her son has had treatment resistant depression with possible bipolar disorder and her daughter has been diagnosed with bipolar disorder but also a history of  borderline personality disorder.  So there is a family history of the spectrum.  Has seen some benefit.   Prior benefit fluvoxamine. Continue fluvoxamine 50 mg BID Clear benefit from fluvoxamine for mood quite significantly. No manic sx. Consider weaning when therapy has helped a little more and further progress.  HA are better.  Disc sleep hygiene and temperature.    CBT around agoraphobia.  Need to push herself to get out more.  She plans to start with Bambi Cottle.  We discussed the short-term risks associated with benzodiazepines including sedation and increased fall risk among others.  Discussed long-term side effect risk including dependence, potential withdrawal symptoms, and the potential eventual dose-related risk of dementia.  But recent studies from 2020 dispute this association between benzodiazepines and dementia risk. Newer studies in 2020 do not support an association with dementia.   Keep this at a minimum given cognitive concerns.  She only uses it when triggered with anxiety usually medical.  She is not using it regularly. Continue Ativan prn 1 mg for panic and triggers.  Discussed potential benefits, risks, and side effects of stimulants with patient to include increased heart rate, palpitations, insomnia, increased anxiety, increased irritability, or decreased appetite.  Instructed patient to contact office if experiencing any significant tolerability issues.  Better with Vyvanse for binge eating and ADD at 40 mg daily per PCP suggestion May also help mood and weight loss should help pain.  Caution it could cause anxiety and other SE Disc pending generic.  Disc dosing options and risk of skipping higher doses will be more noticeably problematic.  FU 3 mos  Lynder Parents, MD, DFAPA    Please see After Visit Summary for patient specific instructions.  Future Appointments  Date Time Provider [place]  04/27/2022 11:45 AM Danie Binder, PT [facility] None   04/27/2022  1:00 PM Cottle, Bambi G, LCSW [facility] None  04/28/2022  3:15 PM Altamese Dilling, PTA [facility] None  05/03/2022 11:00 AM Myrene Galas L, PTA [facility] None  05/03/2022  1:10 PM [facility] MM 2 [facility] CE  05/04/2022  3:00 PM Cottle, Bambi G, LCSW [facility] None  05/09/2022  3:30 PM [facility]-CT 1 [facility]CT [facility]  05/10/2022 11:00 AM Altamese Dilling, PTA [facility] None  05/11/2022 11:45 AM Danie Binder, PT [facility] None  05/15/2022 11:45 AM Danie Binder, PT [facility] None  05/17/2022 11:00 AM Altamese Dilling, PTA [facility] None    No orders of the defined types were placed in this encounter.      -------------------------------

## 2022-04-27 ENCOUNTER — Ambulatory Visit: Payer: Medicare Other

## 2022-04-27 ENCOUNTER — Ambulatory Visit (INDEPENDENT_AMBULATORY_CARE_PROVIDER_SITE_OTHER): Payer: Medicare Other | Admitting: Psychology

## 2022-04-27 DIAGNOSIS — F411 Generalized anxiety disorder: Secondary | ICD-10-CM

## 2022-04-27 DIAGNOSIS — M6281 Muscle weakness (generalized): Secondary | ICD-10-CM

## 2022-04-27 DIAGNOSIS — F331 Major depressive disorder, recurrent, moderate: Secondary | ICD-10-CM | POA: Diagnosis not present

## 2022-04-27 DIAGNOSIS — R293 Abnormal posture: Secondary | ICD-10-CM | POA: Diagnosis not present

## 2022-04-27 DIAGNOSIS — R252 Cramp and spasm: Secondary | ICD-10-CM

## 2022-04-27 DIAGNOSIS — M542 Cervicalgia: Secondary | ICD-10-CM

## 2022-04-27 DIAGNOSIS — F902 Attention-deficit hyperactivity disorder, combined type: Secondary | ICD-10-CM

## 2022-04-27 DIAGNOSIS — F4310 Post-traumatic stress disorder, unspecified: Secondary | ICD-10-CM

## 2022-04-27 DIAGNOSIS — M353 Polymyalgia rheumatica: Secondary | ICD-10-CM

## 2022-04-27 NOTE — Therapy (Signed)
OUTPATIENT PHYSICAL THERAPY TREATMENT   Patient Name: Danielle Bruce MRN: [id number] DOB:1960/09/04, 62 y.o., female Today's Date: 04/27/2022        PT End of Session - 04/27/22 1231     Visit Number 31    Date for PT Re-Evaluation 06/16/22    Authorization Type Medicare B- needs KX  at 14 for 2024 (12 for 2024 on 04/20/22)    Progress Note Due on Visit 93    PT Start Time 1152    PT Stop Time 1231    PT Time Calculation (min) 39 min    Activity Tolerance Patient tolerated treatment well    Behavior During Therapy [facility] for tasks assessed/performed                                  Past Medical History:  Diagnosis Date   Abdominal pain, unspecified site 10/18/2012   Anemia    Anxiety    Arthritis    osteoarthritis   ASCUS (atypical squamous cells of undetermined significance) on Pap smear 05/06/2005   NEG HIGH RISK HPV--C&B BIOPSY BENIGN 12/2005   Asthma    Bipolar 2 disorder ([place])    Cancer ([place])    skin cancer - basal cell   Colon polyps    Complication of anesthesia    anxious afterwards, will get headaches    Constipation    Depression    Fibromyalgia 10/2013   GERD (gastroesophageal reflux disease)    Hearing loss on left    Heart murmur    never had any problems   Hemorrhoids    High cholesterol    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   cytology negative   IBS (irritable bowel syndrome)    Insomnia    Lymphocytic colitis    Lymphoma (H[facility])    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   Bone marrow biopsy showes 8% plasma cells IgA Lambda   Migraines    Nausea alone 10/18/2012   Osteoarthritis    Osteopenia    Peripheral neuropathy    PTSD (post-traumatic stress disorder)    Past Surgical History:  Procedure Laterality Date   BONE MARROW BIOPSY Left 12/18/2013   Plasma cell dyscrasia 8% population of plasma cells   BREAST BIOPSY Right    benign stereo   CESAREAN SECTION  [id number]   CHOLECYSTECTOMY N/A  10/16/2012   Procedure: LAPAROSCOPIC CHOLECYSTECTOMY;  Surgeon: Joyice Faster. Cornett, MD;  Location: [facility];  Service: General;  Laterality: N/A;   COLONOSCOPY     numerous times   DILATION AND CURETTAGE OF UTERUS     ESOPHAGOGASTRODUODENOSCOPY     HEMORRHOID SURGERY  1993   x3   IUD REMOVAL  02/2015   Mirena   LAPAROSCOPIC LYSIS OF ADHESIONS N/A 10/16/2012   Procedure: LAPAROSCOPIC LYSIS OF ADHESIONS;  Surgeon: Marcello Moores A. Cornett, MD;  Location: [facility];  Service: General;  Laterality: N/A;   LAPAROSCOPY N/A 10/16/2012   Procedure: LAPAROSCOPY DIAGNOSTIC;  Surgeon: Joyice Faster. Cornett, MD;  Location: [facility];  Service: General;  Laterality: N/A;   PELVIC LAPAROSCOPY     RADIOLOGY WITH ANESTHESIA N/A 12/16/2015   Procedure: MRI OF BRAIN WITH AND WITHOUT CONTRAST;  Surgeon: Medication Radiologist, MD;  Location: [facility];  Service: Radiology;  Laterality: N/A;   SHOULDER SURGERY  2007/2008   SPINE SURGERY  2010   cervical   Patient Active Problem List   Diagnosis  Date Noted   GAD (generalized anxiety disorder) 12/26/2017   OCD (obsessive compulsive disorder) 12/26/2017   PTSD (post-traumatic stress disorder) 12/26/2017   DDD (degenerative disc disease), cervical 07/14/2016   Primary osteoarthritis of both feet 07/14/2016   Primary osteoarthritis of both hands 07/14/2016   Other fatigue 07/14/2016   History of IBS 07/14/2016   Osteopenia of multiple sites 07/14/2016   Fibromyalgia 01/13/2016   MGUS (monoclonal gammopathy of unknown significance) 12/23/2013   Chronic cholecystitis without calculus 10/18/2012   Abdominal pain, unspecified site 10/18/2012   Nausea alone 10/18/2012   Unspecified constipation 10/18/2012   Depression 10/18/2012   Anxiety    ASCUS (atypical squamous cells of undetermined significance) on Pap smear    IUD    Hemorrhoids 11/29/2010   Abdominal pain, left upper quadrant 11/29/2010    PCP:  Mayra Neer, MD  REFERRING PROVIDER: Mayra Neer, MD  REFERRING  DIAG: fibromyalgia, TMJ dysfunction (added 04/20/22)  THERAPY DIAG:  Abnormal posture  Muscle weakness (generalized)  Cervicalgia  Cramp and spasm  Polymyalgia rheumatica ([place])  Rationale for Evaluation and Treatment Rehabilitation  ONSET DATE: chronic pain (fibromyalgia) with flare-up 2 months ago  SUBJECTIVE:                                                                                                                                                                                                         SUBJECTIVE STATEMENT:  I saw the dentist yesterday. He told me to wear my night guard more so I can reduce the clenching.  He feels it is my muscles and that DN would help.   PERTINENT HISTORY:  Anxiety, bipolar disorder, depression, fibromyalgia, IBS, migraines, osteopenia, PTSD  PAIN:  Are you having pain?Yes 5/10 Pain location: whole body, stress, fatigue TMJ 6/10- chewing sometimes just hurts  Pain description: sore, tight  Aggravating factors: activity, yard work, picking up heavy objects Relieving factors: muscle relaxers, rest, stretching  PRECAUTIONS: Other: chronic pain syndrome and depression Other: slow progression with exercise due to chronic condition  WEIGHT BEARING RESTRICTIONS No  FALLS:  Has patient fallen in last 6 months? No  LIVING ENVIRONMENT: Lives with: lives with their family Lives in: House/apartment  OCCUPATION: on disability  PLOF: Independent with basic ADLs and Leisure: yardwork, walking  Pt cares for her 6 young grandchildren- difficulty with care including lifting and carrying  PATIENT GOALS be more active with less pain, exercise at the gym regularly, lift and carry grandchildren and laundry, sleep with fewer interruptions, get back to more gardening.  OBJECTIVE:   DIAGNOSTIC  FINDINGS: none recent   PATIENT SURVEYS: Eval: Fibromyalgia Impact Questionnaire: first 2 sections only: 49 12/26/21: 40  02/28/22: 28 (first 2 sections  only)  COGNITION: Overall cognitive status: Within functional limits for tasks assessed  SENSATION: WFL  POSTURE: rounded shoulders and forward head  PALPATION: Diffuse palpable tenderness over bil neck, upper traps, thoracic, lumbar and gluteals with trigger points.    CERVICAL ROM:   Active ROM A/PROM (deg) eval A/ROM 12/26/21 A/ROM 04/20/22  Flexion 55    Extension 40    Right lateral flexion 30 35 40  Left lateral flexion 35 40 45  Right rotation 50 60 65  Left rotation 40 65 65   (Blank rows = not tested) TMJ: opening limited by [id number], clicking with opening on the Rt.  Palpable tenderness over Rt masseter and at TMJ UPPER EXTREMITY ROM: UE A/ROM is limited by 20% into flexion and abduction with pain at end range.  Hip flexibility is limited by 25% in all directions with pain in all directions.  UPPER EXTREMITY MMT: UE: 4+/5, LE 4+/5 except hip flexors 4-/5  TODAY'S TREATMENT:  Date: 04/27/22 NuStep: Level 3 x 8 minutes for endurance and mobility-PT present to discuss progress. Ball roll outs forward and lateral x10 each TMJ exercises: controlled opening and protraction Seated figure 4 2x20  Trigger Point Dry-Needling  Treatment instructions: Expect mild to moderate muscle soreness. S/S of pneumothorax if dry needled over a lung field, and to seek immediate medical attention should they occur. Patient verbalized understanding of these instructions and education.  Patient Consent Given: Yes Education handout provided: Previously provided Muscles treated: bil cervical multifidi, masseter, upper traps Treatment response/outcome: Utilized skilled palpation to identify trigger points.  During dry needling able to palpate muscle twitch and muscle elongation  Elongation and release to bil neck and jaw after DN Skilled palpation and monitoring by PT during dry needling   Date: 04/20/22 NuStep: Level 3 x 10 minutes for endurance and mobility-discussed TMJ dysfunction Ball  roll outs forward and lateral x10 each TMJ exercises: controlled opening and protraction Trigger Point Dry-Needling  Treatment instructions: Expect mild to moderate muscle soreness. S/S of pneumothorax if dry needled over a lung field, and to seek immediate medical attention should they occur. Patient verbalized understanding of these instructions and education.  Patient Consent Given: Yes Education handout provided: Previously provided Muscles treated: bil cervical and lumbar multifidi, bil upper traps  Treatment response/outcome: Utilized skilled palpation to identify trigger points.  During dry needling able to palpate muscle twitch and muscle elongation  Elongation and release to bil neck after DN Skilled palpation and monitoring by PT during dry needling   04/19/22:Pt arrives for aquatic physical therapy. Treatment took place in 3.5-5.5 feet of water. Water temperature was 91 degrees F. Pt entered the pool via stairs reciprocally with mild use of rails. Pt requires buoyancy of water for support and to offload joints with strengthening exercises. Pt utilizes viscosity of the water required for strengthening. Seated water bench with 75% submersion Pt performed seated LE AROM exercises 20x in all planes,   Standing in 50%- 75% depth water pt performed water walking in all 4 directions 10x. VC for speed in order to generate appropriate current for resistance and/or UE movements. Water bells and UE single buoy wts used for push/pull with UE. 50% depth standing: knee ext Bil 15x2 with small noodle, single buoy UE wt push and hold with 6 lengths of high knee marching, hip 3 ways with ankle  fins 20x Bil NO UE, core/lat press 20x with double buoy wts, 10 min horseback bicycle on yellow noodle.    HOME EXERCISE PROGRAM: Access Code: [email or web address] Access Code: [email or web address] URL: [email or web address] Date: 04/20/2022 Prepared by: Claiborne Billings  Exercises - - Controlled Jaw Opening with Tongue on  Roof of Mouth  - 3 x daily - 7 x weekly - 1 sets - 10 reps - Jaw Protraction  - 3 x daily - 7 x weekly - 1 sets - 10 reps ASSESSMENT:  CLINICAL IMPRESSION:  Pt was tired today due to just waking up prior to session.  Pt continues to report 35-40% overall improvement in symptoms with land and pool therapy.  Pt did well with gentle mobility exercises today and had good response to DN to jaw musculatre with twitch response and improved tissue mobility after manual therapy today.   Pt with chronic pain and is responding well, yet slowly to PT at this time. She is able to be more active with reduced pain with care of herself and her young grandchildren. She does yoga and regular stretching to manage symptoms.  Patient will benefit from skilled PT to address the below impairments and improve overall function.   OBJECTIVE IMPAIRMENTS decreased activity tolerance, decreased endurance, difficulty walking, decreased strength, increased muscle spasms, impaired flexibility, improper body mechanics, postural dysfunction, and pain.   ACTIVITY LIMITATIONS carrying, lifting, sitting, standing, reach over head, hygiene/grooming, and locomotion level  PARTICIPATION LIMITATIONS: meal prep, cleaning, laundry, driving, community activity, and yard work  PERSONAL FACTORS Past/current experiences, Time since onset of injury/illness/exacerbation, and 3+ comorbidities: fibromyalgia depression, anxiety,   are also affecting patient's functional outcome.   REHAB POTENTIAL: Good  CLINICAL DECISION MAKING: Evolving/moderate complexity  EVALUATION COMPLEXITY: Moderate   GOALS: Goals reviewed with patient? Yes  SHORT TERM GOALS: Target date: 03/20/22  Return to regular performance of HEP 2-3x/day to improve mobility  Baseline: stretching 2x/day (02/28/22) Goal status: In progress  2.  Report > or = to 25% reduction in cervical and lumbar pain with ADLs and self-care  Baseline: 20% better overall (02/28/2022) Goal  status: in progress   3.  Reduce fibromyalgia impact score to < or = to 39  Baseline: 28 (02/28/22) Goal status: MET    LONG TERM GOALS: Target date: 06/16/22  Be independent in advanced HEP Baseline: gentle exercise  Goal status: In progress   2.  Reduce fibromyalgia impact questionnaire to < or = to 21 Baseline: 28 (02/28/22) Goal status: Revised   3.  Lift and carry laundry and her grandchildren with > or = to 50% reduction in pain Baseline: 20% better (02/28/22) Goal status: In progress   4.  Report > or = to 60% reduction in neck and lumbar pain with ADLs and self-care  Baseline: 30-45% better (04/20/22) Goal status: revised  5.  Return to yardwork verbalize appropriate rest breaks and mechanics  Baseline: hasn't been doing due to being sick (02/28/22) Goal status: In progress    6. Report > or = to 30% reduction in Rt jaw pain with chewing and talking   Baseline 6/10   Goal Status: NEW PLAN: PT FREQUENCY: 2x/week  PT DURATION: 8 weeks  PLANNED INTERVENTIONS: Therapeutic exercises, Therapeutic activity, Neuromuscular re-education, Balance training, Gait training, Patient/Family education, Self Care, Joint mobilization, Aquatic Therapy, Dry Needling, Spinal manipulation, Spinal mobilization, Cryotherapy, Moist heat, Taping, Traction, Manual therapy, and Re-evaluation  PLAN FOR NEXT SESSION:  manual as needed, manage exercises as needed. Encourage gentle mobility. Aquatics to  manage. chronic pain.    Sigurd Sos, PT 04/27/22 12:32 PM   [facility] [address], [place], [place] [address] Phone # [phone] Fax [phone]

## 2022-04-27 NOTE — Progress Notes (Signed)
[facility] Counselor/Therapist Progress Note  Patient ID: GARNELL KILBURN, MRN: [id number],    Date: 04/27/2022  Time Spent: 60 minutes  Treatment Type: Individual Therapy  Reported Symptoms: flashbacks, responding to triggers, depression, anxiety, being busy all of the time, dissociation  Mental Status Exam: Appearance:  Casual     Behavior: Appropriate  Motor: Restlestness  Speech/Language:  Clear and Coherent  Affect: Blunt  Mood: pleasant  Thought process: normal  Thought content:   WNL  Sensory/Perceptual disturbances:   WNL  Orientation: oriented to person, place, time/date, and situation  Attention: Good  Concentration: Good  Memory: WNL  Fund of knowledge:  Good  Insight:   Good  Judgment:  Fair  Impulse Control: Good   Risk Assessment: Danger to Self:  No Self-injurious Behavior: No Danger to Others: No Duty to Warn:no Physical Aggression / Violence:No  Access to Firearms a concern: No  Gang Involvement:No   Subjective: The patient attended a face-to-face individual therapy session in the office today.  The patient presents as pleasant and cooperative.  She is a little anxious as well.  We talked about her wanting to be able to go to the mountains but she reports that she has a phobia of heights and spoke with Dr. Clovis Pu about this yesterday.  I explained to her that we could probably do some EMDR around that and maybe that would help her being more desensitized to the fears that she has around this.  We also talked about how she is handling the grief over the loss of her father and she seems to be doing relatively well.  She likely had done a lot of grieving prior to his passing.  The patient really did not have a whole lot of problems that she wanted to discuss today so it may be a good time to begin talking about doing therapy around the trauma that she has from the past.  We talked about moving forward with that as soon as we can make sure she gets  in weekly.  Interventions: Cognitive Behavioral Therapy, Mindfulness Meditation, Eye Movement Desensitization and Reprocessing (EMDR), Insight-Oriented, and Interpersonal  Diagnosis:PTSD (post-traumatic stress disorder)  Generalized anxiety disorder  Major depressive disorder, recurrent episode, moderate (HCC)  Attention deficit hyperactivity disorder (ADHD), combined type  Plan: Plan of Care: Client Abilities/Strengths  Insightful, motivated, Supportive family Client Treatment Preferences  Outpatient Individual therapy/EMDR  Client Statement of Needs  "I think I have PTSD and I feel like I need another kind of therapy than talk therapy" Treatment Level  Outpatient Individual therapy  Symptoms  Demonstrates an exaggerated startle response.:  (Status: maintained). Depressed  or irritable mood.: (Status:maintained). Describes a reliving of the event,  particularly through dissociative flashbacks.: (Status: maintained). Displays a  significant decline in interest and engagement in activities.:  (Status: maintained). Displays significant psychological and/or physiological distress resulting from internal and external  clues that are reminiscent of the traumatic event.: (Status: maintained).  Experiences disturbances in sleep.:  (Status: maintained). Experiences disturbing  and persistent thoughts, images, and/or perceptions of the traumatic event.:  (Status: maintained). Experiences frequent nightmares.: (Status: maintained).  Feelings of hopelessness, worthlessness, or inappropriate guilt.: No Description Entered (Status:  improved). Has been exposed to a traumatic event involving actual or perceived threat of death or  serious injury.:  (Status: maintained). Impairment in social, occupational, or  other areas of functioning.:(Status: maintained). Intentionally avoids activities,  places, people, or objects (e.g., up-armored vehicles) that evoke memories of the event.: (Status:  maintained). Intentionally avoids thoughts, feelings, or discussions related  to the traumatic event.: (Status: maintained). Reports difficulty concentrating as  well as feelings of guilt (Status:maintained). Reports response of intense fear,  helplessness, or horror to the traumatic event.:  (Status: maintained).  Problems Addressed  Unipolar Depression, Posttraumatic Stress Disorder (PTSD), Posttraumatic Stress Disorder (PTSD),   Posttraumatic Stress Disorder (PTSD), Posttraumatic Stress Disorder (PTSD)  Goals 1. Develop healthy thinking patterns and beliefs about self, others, and the world that lead to the alleviation and help prevent the relapse of  depression. Objective Identify and replace thoughts and beliefs that support depression. Target Date: 2023/01/04 Frequency: Weekly Progress: 0 Modality: individual Related Interventions 1. Explore and restructure underlying assumptions and beliefs reflected in biased self-talk that  may put the client at risk for relapse or recurrence. 2. Conduct Cognitive-Behavioral Therapy (see Cognitive Behavior Therapy by Olevia Bowens; Overcoming Depression by Lynita Lombard al.), beginning with helping the client learn the connection among  cognition, depressive feelings, and actions. 2. Eliminate or reduce the negative impact trauma related symptoms have  on social, occupational, and family functioning. Objective Learn and implement personal skills to manage challenging situations related to trauma. Target Date: 2023/01/04 Frequency: Weekly Progress: 0 Modality: individual 3. No longer avoids persons, places, activities, and objects that are  reminiscent of the traumatic event. Objective Participate in Eye Movement Desensitization and Reprocessing (EMDR) to reduce emotional distress  related to traumatic thoughts, feelings, and images. Target Date: 2023/01/04 Frequency: Weekly Progress: 0 Modality: individual   Related Interventions 1. Utilize Eye  Movement Desensitization and Reprocessing (EMDR) to reduce the client's  emotional reactivity to the traumatic event and reduce PTSD symptoms. Objective Learn and implement guided self-dialogue to manage thoughts, feelings, and urges brought on by  encounters with trauma-related situations. Target Date: 2024-11/07 Frequency: Weekly Progress: 0 Modality: individual Related Interventions 1. Teach the client a guided self-dialogue procedure in which he/she learns to recognize  maladaptive self-talk, challenges its biases, copes with engendered feelings, overcomes  avoidance, and reinforces his/her accomplishments; review and reinforce progress, problemsolve obstacles. 4. No longer experiences intrusive event recollections, avoidance of event  reminders, intense arousal, or disinterest in activities or  relationships. 5. Thinks about or openly discusses the traumatic event with others  without experiencing psychological or physiological distress. Diagnosis Axis  none F43.10 (Posttraumatic stress disorder) - Open - [Signifier: n/a] Posttraumatic Stress  Disorder  Conditions For Discharge Achievement of treatment goals and objectives   Dejohn Ibarra G Kassidy Frankson, LCSW

## 2022-04-27 NOTE — Therapy (Signed)
OUTPATIENT PHYSICAL THERAPY TREATMENT   Patient Name: Danielle Bruce MRN: [id number] DOB:20-Mar-1960, 62 y.o., female Today's Date: 04/28/2022        PT End of Session - 04/28/22 1719     Visit Number 32    Date for PT Re-Evaluation 06/16/22    Authorization Type Medicare B- needs KX  at 14 for 2024 (12 for 2024 on 04/20/22)    Progress Note Due on Visit 41    PT Start Time 1515    PT Stop Time 1600    PT Time Calculation (min) 45 min    Activity Tolerance Patient tolerated treatment well    Behavior During Therapy [facility] for tasks assessed/performed                                   Past Medical History:  Diagnosis Date   Abdominal pain, unspecified site 10/18/2012   Anemia    Anxiety    Arthritis    osteoarthritis   ASCUS (atypical squamous cells of undetermined significance) on Pap smear 05/06/2005   NEG HIGH RISK HPV--C&B BIOPSY BENIGN 12/2005   Asthma    Bipolar 2 disorder ([place])    Cancer ([place])    skin cancer - basal cell   Colon polyps    Complication of anesthesia    anxious afterwards, will get headaches    Constipation    Depression    Fibromyalgia 10/2013   GERD (gastroesophageal reflux disease)    Hearing loss on left    Heart murmur    never had any problems   Hemorrhoids    High cholesterol    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   cytology negative   IBS (irritable bowel syndrome)    Insomnia    Lymphocytic colitis    Lymphoma (HCC)    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   Bone marrow biopsy showes 8% plasma cells IgA Lambda   Migraines    Nausea alone 10/18/2012   Osteoarthritis    Osteopenia    Peripheral neuropathy    PTSD (post-traumatic stress disorder)    Past Surgical History:  Procedure Laterality Date   BONE MARROW BIOPSY Left 12/18/2013   Plasma cell dyscrasia 8% population of plasma cells   BREAST BIOPSY Right    benign stereo   CESAREAN SECTION  [id number]   CHOLECYSTECTOMY N/A  10/16/2012   Procedure: LAPAROSCOPIC CHOLECYSTECTOMY;  Surgeon: Joyice Faster. Cornett, MD;  Location: [facility];  Service: General;  Laterality: N/A;   COLONOSCOPY     numerous times   DILATION AND CURETTAGE OF UTERUS     ESOPHAGOGASTRODUODENOSCOPY     HEMORRHOID SURGERY  1993   x3   IUD REMOVAL  02/2015   Mirena   LAPAROSCOPIC LYSIS OF ADHESIONS N/A 10/16/2012   Procedure: LAPAROSCOPIC LYSIS OF ADHESIONS;  Surgeon: Marcello Moores A. Cornett, MD;  Location: [facility];  Service: General;  Laterality: N/A;   LAPAROSCOPY N/A 10/16/2012   Procedure: LAPAROSCOPY DIAGNOSTIC;  Surgeon: Joyice Faster. Cornett, MD;  Location: [facility];  Service: General;  Laterality: N/A;   PELVIC LAPAROSCOPY     RADIOLOGY WITH ANESTHESIA N/A 12/16/2015   Procedure: MRI OF BRAIN WITH AND WITHOUT CONTRAST;  Surgeon: Medication Radiologist, MD;  Location: [place];  Service: Radiology;  Laterality: N/A;   SHOULDER SURGERY  2007/2008   SPINE SURGERY  2010   cervical   Patient Active Problem List  Diagnosis Date Noted   GAD (generalized anxiety disorder) 12/26/2017   OCD (obsessive compulsive disorder) 12/26/2017   PTSD (post-traumatic stress disorder) 12/26/2017   DDD (degenerative disc disease), cervical 07/14/2016   Primary osteoarthritis of both feet 07/14/2016   Primary osteoarthritis of both hands 07/14/2016   Other fatigue 07/14/2016   History of IBS 07/14/2016   Osteopenia of multiple sites 07/14/2016   Fibromyalgia 01/13/2016   MGUS (monoclonal gammopathy of unknown significance) 12/23/2013   Chronic cholecystitis without calculus 10/18/2012   Abdominal pain, unspecified site 10/18/2012   Nausea alone 10/18/2012   Unspecified constipation 10/18/2012   Depression 10/18/2012   Anxiety    ASCUS (atypical squamous cells of undetermined significance) on Pap smear    IUD    Hemorrhoids 11/29/2010   Abdominal pain, left upper quadrant 11/29/2010    PCP:  Mayra Neer, MD  REFERRING PROVIDER: Mayra Neer, MD  REFERRING  DIAG: fibromyalgia, TMJ dysfunction (added 04/20/22)  THERAPY DIAG:  Abnormal posture  Muscle weakness (generalized)  Cervicalgia  Cramp and spasm  Rationale for Evaluation and Treatment Rehabilitation  ONSET DATE: chronic pain (fibromyalgia) with flare-up 2 months ago  SUBJECTIVE:                                                                                                                                                                                                         SUBJECTIVE STATEMENT:  I am just tired, didn't sleep well. I know after being in the water I will feel better.   PERTINENT HISTORY:  Anxiety, bipolar disorder, depression, fibromyalgia, IBS, migraines, osteopenia, PTSD  PAIN:  Are you having pain?Yes 5/10 Pain location: whole body, stress, fatigue TMJ 6/10- chewing sometimes just hurts  Pain description: sore, tight  Aggravating factors: activity, yard work, picking up heavy objects Relieving factors: muscle relaxers, rest, stretching  PRECAUTIONS: Other: chronic pain syndrome and depression Other: slow progression with exercise due to chronic condition  WEIGHT BEARING RESTRICTIONS No  FALLS:  Has patient fallen in last 6 months? No  LIVING ENVIRONMENT: Lives with: lives with their family Lives in: House/apartment  OCCUPATION: on disability  PLOF: Independent with basic ADLs and Leisure: yardwork, walking  Pt cares for her 72 young grandchildren- difficulty with care including lifting and carrying  PATIENT GOALS be more active with less pain, exercise at the gym regularly, lift and carry grandchildren and laundry, sleep with fewer interruptions, get back to more gardening.  OBJECTIVE:   DIAGNOSTIC FINDINGS: none recent   PATIENT SURVEYS: Eval: Fibromyalgia Impact Questionnaire: first 2 sections only: 49 12/26/21:  40  02/28/22: 28 (first 2 sections only)  COGNITION: Overall cognitive status: Within functional limits for tasks  assessed  SENSATION: WFL  POSTURE: rounded shoulders and forward head  PALPATION: Diffuse palpable tenderness over bil neck, upper traps, thoracic, lumbar and gluteals with trigger points.    CERVICAL ROM:   Active ROM A/PROM (deg) eval A/ROM 12/26/21 A/ROM 04/20/22  Flexion 55    Extension 40    Right lateral flexion 30 35 40  Left lateral flexion 35 40 45  Right rotation 50 60 65  Left rotation 40 65 65   (Blank rows = not tested) TMJ: opening limited by [id number], clicking with opening on the Rt.  Palpable tenderness over Rt masseter and at TMJ UPPER EXTREMITY ROM: UE A/ROM is limited by 20% into flexion and abduction with pain at end range.  Hip flexibility is limited by 25% in all directions with pain in all directions.  UPPER EXTREMITY MMT: UE: 4+/5, LE 4+/5 except hip flexors 4-/5  TODAY'S TREATMENT:   04/28/22:Pt arrives for aquatic physical therapy. Treatment took place in 3.5-5.5 feet of water. Water temperature was 91 degrees F. Pt entered the pool via stairs reciprocally with mild use of rails. Pt requires buoyancy of water for support and to offload joints with strengthening exercises. Pt utilizes viscosity of the water required for strengthening. Seated water bench with 75% submersion Pt performed seated LE AROM exercises 20x in all planes,   Standing in 50%- 75% depth water pt performed water walking in all 4 directions 10x. VC for speed in order to generate appropriate current for resistance and/or UE movements. Water bells and UE single buoy wts used for push/pull with UE. 50% depth standing: knee ext Bil 15x2 with small noodle, single buoy UE wt push and hold with 6 lengths of high knee marching, hip 3 ways with ankle fins 20x required some UE for balance today, core/lat press 20x with double buoy wts, 5 min horseback bicycle on yellow noodle then 5 min bicycle holding just the double buoy UE weights.    Date: 04/27/22 NuStep: Level 3 x 8 minutes for endurance and  mobility-PT present to discuss progress. Ball roll outs forward and lateral x10 each TMJ exercises: controlled opening and protraction Seated figure 4 2x20  Trigger Point Dry-Needling  Treatment instructions: Expect mild to moderate muscle soreness. S/S of pneumothorax if dry needled over a lung field, and to seek immediate medical attention should they occur. Patient verbalized understanding of these instructions and education.  Patient Consent Given: Yes Education handout provided: Previously provided Muscles treated: bil cervical multifidi, masseter, upper traps Treatment response/outcome: Utilized skilled palpation to identify trigger points.  During dry needling able to palpate muscle twitch and muscle elongation  Elongation and release to bil neck and jaw after DN Skilled palpation and monitoring by PT during dry needling   Date: 04/20/22 NuStep: Level 3 x 10 minutes for endurance and mobility-discussed TMJ dysfunction Ball roll outs forward and lateral x10 each TMJ exercises: controlled opening and protraction Trigger Point Dry-Needling  Treatment instructions: Expect mild to moderate muscle soreness. S/S of pneumothorax if dry needled over a lung field, and to seek immediate medical attention should they occur. Patient verbalized understanding of these instructions and education.  Patient Consent Given: Yes Education handout provided: Previously provided Muscles treated: bil cervical and lumbar multifidi, bil upper traps  Treatment response/outcome: Utilized skilled palpation to identify trigger points.  During dry needling able to palpate muscle twitch and muscle elongation  Elongation and release to bil neck after DN Skilled palpation and monitoring by PT during dry needling   04/19/22:Pt arrives for aquatic physical therapy. Treatment took place in 3.5-5.5 feet of water. Water temperature was 91 degrees F. Pt entered the pool via stairs reciprocally with mild use of rails. Pt  requires buoyancy of water for support and to offload joints with strengthening exercises. Pt utilizes viscosity of the water required for strengthening. Seated water bench with 75% submersion Pt performed seated LE AROM exercises 20x in all planes,   Standing in 50%- 75% depth water pt performed water walking in all 4 directions 10x. VC for speed in order to generate appropriate current for resistance and/or UE movements. Water bells and UE single buoy wts used for push/pull with UE. 50% depth standing: knee ext Bil 15x2 with small noodle, single buoy UE wt push and hold with 6 lengths of high knee marching, hip 3 ways with ankle fins 20x Bil NO UE, core/lat press 20x with double buoy wts, 10 min horseback bicycle on yellow noodle.    HOME EXERCISE PROGRAM: Access Code: [email or web address] Access Code: [email or web address] URL: [email or web address] Date: 04/20/2022 Prepared by: Claiborne Billings  Exercises - - Controlled Jaw Opening with Tongue on Roof of Mouth  - 3 x daily - 7 x weekly - 1 sets - 10 reps - Jaw Protraction  - 3 x daily - 7 x weekly - 1 sets - 10 reps ASSESSMENT:  CLINICAL IMPRESSION: No issues with aquatic therapy today. Pt was able to perform very difficult underwater bicycle holding the double buoy UE weights underwater and using her core to keep her shape. No pain reported.  OBJECTIVE IMPAIRMENTS decreased activity tolerance, decreased endurance, difficulty walking, decreased strength, increased muscle spasms, impaired flexibility, improper body mechanics, postural dysfunction, and pain.   ACTIVITY LIMITATIONS carrying, lifting, sitting, standing, reach over head, hygiene/grooming, and locomotion level  PARTICIPATION LIMITATIONS: meal prep, cleaning, laundry, driving, community activity, and yard work  PERSONAL FACTORS Past/current experiences, Time since onset of injury/illness/exacerbation, and 3+ comorbidities: fibromyalgia depression, anxiety,   are also affecting patient's  functional outcome.   REHAB POTENTIAL: Good  CLINICAL DECISION MAKING: Evolving/moderate complexity  EVALUATION COMPLEXITY: Moderate   GOALS: Goals reviewed with patient? Yes  SHORT TERM GOALS: Target date: 03/20/22  Return to regular performance of HEP 2-3x/day to improve mobility  Baseline: stretching 2x/day (02/28/22) Goal status: In progress  2.  Report > or = to 25% reduction in cervical and lumbar pain with ADLs and self-care  Baseline: 20% better overall (02/28/2022) Goal status: in progress   3.  Reduce fibromyalgia impact score to < or = to 39  Baseline: 28 (02/28/22) Goal status: MET    LONG TERM GOALS: Target date: 06/16/22  Be independent in advanced HEP Baseline: gentle exercise  Goal status: In progress   2.  Reduce fibromyalgia impact questionnaire to < or = to 21 Baseline: 28 (02/28/22) Goal status: Revised   3.  Lift and carry laundry and her grandchildren with > or = to 50% reduction in pain Baseline: 20% better (02/28/22) Goal status: In progress   4.  Report > or = to 60% reduction in neck and lumbar pain with ADLs and self-care  Baseline: 30-45% better (04/20/22) Goal status: revised  5.  Return to yardwork verbalize appropriate rest breaks and mechanics  Baseline: hasn't been doing due to being sick (02/28/22) Goal status: In progress    6. Report > or = to 30% reduction in Rt jaw  pain with chewing and talking   Baseline 6/10   Goal Status: NEW PLAN: PT FREQUENCY: 2x/week  PT DURATION: 8 weeks  PLANNED INTERVENTIONS: Therapeutic exercises, Therapeutic activity, Neuromuscular re-education, Balance training, Gait training, Patient/Family education, Self Care, Joint mobilization, Aquatic Therapy, Dry Needling, Spinal manipulation, Spinal mobilization, Cryotherapy, Moist heat, Taping, Traction, Manual therapy, and Re-evaluation  PLAN FOR NEXT SESSION:  manual as needed, manage exercises as needed. Encourage gentle mobility. Aquatics to manage. chronic  pain.    Myrene Galas, PTA 04/28/22 5:21 PM    [facility] [address], [place], [place] [address] Phone # [phone] Fax [phone]

## 2022-04-28 ENCOUNTER — Encounter: Payer: Self-pay | Admitting: Physical Therapy

## 2022-04-28 ENCOUNTER — Ambulatory Visit: Payer: Medicare Other | Attending: Family Medicine | Admitting: Physical Therapy

## 2022-04-28 DIAGNOSIS — M353 Polymyalgia rheumatica: Secondary | ICD-10-CM | POA: Diagnosis not present

## 2022-04-28 DIAGNOSIS — M6281 Muscle weakness (generalized): Secondary | ICD-10-CM | POA: Insufficient documentation

## 2022-04-28 DIAGNOSIS — R252 Cramp and spasm: Secondary | ICD-10-CM | POA: Insufficient documentation

## 2022-04-28 DIAGNOSIS — R293 Abnormal posture: Secondary | ICD-10-CM | POA: Insufficient documentation

## 2022-04-28 DIAGNOSIS — M542 Cervicalgia: Secondary | ICD-10-CM | POA: Insufficient documentation

## 2022-05-02 NOTE — Therapy (Unsigned)
OUTPATIENT PHYSICAL THERAPY TREATMENT   Patient Name: Danielle Bruce MRN: [id number] DOB:[birth year], 62 y.o., female Today's Date: 05/03/2022        PT End of Session - 05/03/22 2126     Visit Number 33    Date for PT Re-Evaluation 06/16/22    Authorization Type Medicare B- needs KX  at 65 for 2024 (12 for 2024 on 04/20/22)    Progress Note Due on Visit 44    PT Start Time 1110   pt late   PT Stop Time 1145    PT Time Calculation (min) 35 min    Activity Tolerance Patient tolerated treatment well    Behavior During Therapy [facility] for tasks assessed/performed                                    Past Medical History:  Diagnosis Date   Abdominal pain, unspecified site 10/18/2012   Anemia    Anxiety    Arthritis    osteoarthritis   ASCUS (atypical squamous cells of undetermined significance) on Pap smear 05/06/2005   NEG HIGH RISK HPV--C&B BIOPSY BENIGN 12/2005   Asthma    Bipolar 2 disorder ([place])    Cancer ([place])    skin cancer - basal cell   Colon polyps    Complication of anesthesia    anxious afterwards, will get headaches    Constipation    Depression    Fibromyalgia 10/2013   GERD (gastroesophageal reflux disease)    Hearing loss on left    Heart murmur    never had any problems   Hemorrhoids    High cholesterol    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   cytology negative   IBS (irritable bowel syndrome)    Insomnia    Lymphocytic colitis    Lymphoma (HCC)    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   Bone marrow biopsy showes 8% plasma cells IgA Lambda   Migraines    Nausea alone 10/18/2012   Osteoarthritis    Osteopenia    Peripheral neuropathy    PTSD (post-traumatic stress disorder)    Past Surgical History:  Procedure Laterality Date   BONE MARROW BIOPSY Left 12/18/2013   Plasma cell dyscrasia 8% population of plasma cells   BREAST BIOPSY Right    benign stereo   CESAREAN SECTION  [id number]    CHOLECYSTECTOMY N/A 10/16/2012   Procedure: LAPAROSCOPIC CHOLECYSTECTOMY;  Surgeon: Joyice Faster. Cornett, MD;  Location: [facility];  Service: General;  Laterality: N/A;   COLONOSCOPY     numerous times   DILATION AND CURETTAGE OF UTERUS     ESOPHAGOGASTRODUODENOSCOPY     HEMORRHOID SURGERY  1993   x3   IUD REMOVAL  02/2015   Mirena   LAPAROSCOPIC LYSIS OF ADHESIONS N/A 10/16/2012   Procedure: LAPAROSCOPIC LYSIS OF ADHESIONS;  Surgeon: Marcello Moores A. Cornett, MD;  Location: [facility];  Service: General;  Laterality: N/A;   LAPAROSCOPY N/A 10/16/2012   Procedure: LAPAROSCOPY DIAGNOSTIC;  Surgeon: Joyice Faster. Cornett, MD;  Location: [facility];  Service: General;  Laterality: N/A;   PELVIC LAPAROSCOPY     RADIOLOGY WITH ANESTHESIA N/A 12/16/2015   Procedure: MRI OF BRAIN WITH AND WITHOUT CONTRAST;  Surgeon: Medication Radiologist, MD;  Location: [facility];  Service: Radiology;  Laterality: N/A;   SHOULDER SURGERY  2007/2008   SPINE SURGERY  2010   cervical   Patient Active  Problem List   Diagnosis Date Noted   GAD (generalized anxiety disorder) 12/26/2017   OCD (obsessive compulsive disorder) 12/26/2017   PTSD (post-traumatic stress disorder) 12/26/2017   DDD (degenerative disc disease), cervical 07/14/2016   Primary osteoarthritis of both feet 07/14/2016   Primary osteoarthritis of both hands 07/14/2016   Other fatigue 07/14/2016   History of IBS 07/14/2016   Osteopenia of multiple sites 07/14/2016   Fibromyalgia 01/13/2016   MGUS (monoclonal gammopathy of unknown significance) 12/23/2013   Chronic cholecystitis without calculus 10/18/2012   Abdominal pain, unspecified site 10/18/2012   Nausea alone 10/18/2012   Unspecified constipation 10/18/2012   Depression 10/18/2012   Anxiety    ASCUS (atypical squamous cells of undetermined significance) on Pap smear    IUD    Hemorrhoids 11/29/2010   Abdominal pain, left upper quadrant 11/29/2010    PCP:  Mayra Neer, MD  REFERRING PROVIDER: Mayra Neer, MD  REFERRING DIAG: fibromyalgia, TMJ dysfunction (added 04/20/22)  THERAPY DIAG:  Abnormal posture  Muscle weakness (generalized)  Cramp and spasm  Cervicalgia  Rationale for Evaluation and Treatment Rehabilitation  ONSET DATE: chronic pain (fibromyalgia) with flare-up 2 months ago  SUBJECTIVE:                                                                                                                                                                                                         SUBJECTIVE STATEMENT:  Having weird back and rib pain.  PERTINENT HISTORY:  Anxiety, bipolar disorder, depression, fibromyalgia, IBS, migraines, osteopenia, PTSD  PAIN:  Are you having pain?Yes 5/10 Pain location: low back, ribs, stress, fatigue TMJ 6/10- chewing sometimes just hurts  Pain description: sore, tight  Aggravating factors: activity, yard work, picking up heavy objects Relieving factors: muscle relaxers, rest, stretching  PRECAUTIONS: Other: chronic pain syndrome and depression Other: slow progression with exercise due to chronic condition  WEIGHT BEARING RESTRICTIONS No  FALLS:  Has patient fallen in last 6 months? No  LIVING ENVIRONMENT: Lives with: lives with their family Lives in: House/apartment  OCCUPATION: on disability  PLOF: Independent with basic ADLs and Leisure: yardwork, walking  Pt cares for her 11 young grandchildren- difficulty with care including lifting and carrying  PATIENT GOALS be more active with less pain, exercise at the gym regularly, lift and carry grandchildren and laundry, sleep with fewer interruptions, get back to more gardening.  OBJECTIVE:   DIAGNOSTIC FINDINGS: none recent   PATIENT SURVEYS: Eval: Fibromyalgia Impact Questionnaire: first 2 sections only: 49 12/26/21: 40  02/28/22: 28 (first 2 sections only)  COGNITION: Overall cognitive status: Within functional limits for tasks assessed  SENSATION: WFL  POSTURE:  rounded shoulders and forward head  PALPATION: Diffuse palpable tenderness over bil neck, upper traps, thoracic, lumbar and gluteals with trigger points.    CERVICAL ROM:   Active ROM A/PROM (deg) eval A/ROM 12/26/21 A/ROM 04/20/22  Flexion 55    Extension 40    Right lateral flexion 30 35 40  Left lateral flexion 35 40 45  Right rotation 50 60 65  Left rotation 40 65 65   (Blank rows = not tested) TMJ: opening limited by [id number], clicking with opening on the Rt.  Palpable tenderness over Rt masseter and at TMJ UPPER EXTREMITY ROM: UE A/ROM is limited by 20% into flexion and abduction with pain at end range.  Hip flexibility is limited by 25% in all directions with pain in all directions.  UPPER EXTREMITY MMT: UE: 4+/5, LE 4+/5 except hip flexors 4-/5  TODAY'S TREATMENT:   05/03/22: Pt arrives for aquatic physical therapy. Treatment took place in 3.5-5.5 feet of water. Water temperature was 91 degrees F. Pt entered the pool via stairs reciprocally with mild use of rails. Pt requires buoyancy of water for support and to offload joints with strengthening exercises. Pt utilizes viscosity of the water required for strengthening. Seated water bench with 75% submersion Pt performed seated LE AROM exercises 20x in all planes,   Standing in 50%- 75% depth water pt performed water walking in all 4 directions 10x. VC for speed in order to generate appropriate current for resistance and/or UE movements. Water bells and UE single buoy wts used for push/pull with UE. 50% depth standing: knee ext Bil 15x2 with small noodle, single buoy UE wt push and hold with 6 lengths of high knee marching, hip 3 ways with ankle fins 20x required some UE for balance today, core/lat press 20x with double buoy wts, 5 min horseback bicycle on yellow noodle then 5 min bicycle holding just the double buoy UE weights.     04/28/22:Pt arrives for aquatic physical therapy. Treatment took place in 3.5-5.5 feet of water. Water  temperature was 91 degrees F. Pt entered the pool via stairs reciprocally with mild use of rails. Pt requires buoyancy of water for support and to offload joints with strengthening exercises. Pt utilizes viscosity of the water required for strengthening. Seated water bench with 75% submersion Pt performed seated LE AROM exercises 20x in all planes,   Standing in 50%- 75% depth water pt performed water walking in all 4 directions 10x. VC for speed in order to generate appropriate current for resistance and/or UE movements. Water bells and UE single buoy wts used for push/pull with UE. 50% depth standing: knee ext Bil 15x2 with small noodle, single buoy UE wt push and hold with 6 lengths of high knee marching, hip 3 ways with ankle fins 20x required some UE for balance today, core/lat press 20x with double buoy wts, 5 min horseback bicycle on yellow noodle then 5 min bicycle holding just the double buoy UE weights.    Date: 04/27/22 NuStep: Level 3 x 8 minutes for endurance and mobility-PT present to discuss progress. Ball roll outs forward and lateral x10 each TMJ exercises: controlled opening and protraction Seated figure 4 2x20  Trigger Point Dry-Needling  Treatment instructions: Expect mild to moderate muscle soreness. S/S of pneumothorax if dry needled over a lung field, and to seek immediate medical attention should they occur. Patient verbalized understanding of these instructions  and education.  Patient Consent Given: Yes Education handout provided: Previously provided Muscles treated: bil cervical multifidi, masseter, upper traps Treatment response/outcome: Utilized skilled palpation to identify trigger points.  During dry needling able to palpate muscle twitch and muscle elongation  Elongation and release to bil neck and jaw after DN Skilled palpation and monitoring by PT during dry needling   HOME EXERCISE PROGRAM: Access Code: [email or web address] Access Code: [email or web address] URL:  [email or web address] Date: 04/20/2022 Prepared by: Claiborne Billings  Exercises - - Controlled Jaw Opening with Tongue on Roof of Mouth  - 3 x daily - 7 x weekly - 1 sets - 10 reps - Jaw Protraction  - 3 x daily - 7 x weekly - 1 sets - 10 reps ASSESSMENT:  CLINICAL IMPRESSION: Pt reports low back and rib pain. This pain did not interfere with her aquatic exercises today. Treatment time slightly reduced since pt was late getting to facility.   OBJECTIVE IMPAIRMENTS decreased activity tolerance, decreased endurance, difficulty walking, decreased strength, increased muscle spasms, impaired flexibility, improper body mechanics, postural dysfunction, and pain.   ACTIVITY LIMITATIONS carrying, lifting, sitting, standing, reach over head, hygiene/grooming, and locomotion level  PARTICIPATION LIMITATIONS: meal prep, cleaning, laundry, driving, community activity, and yard work  PERSONAL FACTORS Past/current experiences, Time since onset of injury/illness/exacerbation, and 3+ comorbidities: fibromyalgia depression, anxiety,   are also affecting patient's functional outcome.   REHAB POTENTIAL: Good  CLINICAL DECISION MAKING: Evolving/moderate complexity  EVALUATION COMPLEXITY: Moderate   GOALS: Goals reviewed with patient? Yes  SHORT TERM GOALS: Target date: 03/20/22  Return to regular performance of HEP 2-3x/day to improve mobility  Baseline: stretching 2x/day (02/28/22) Goal status: In progress  2.  Report > or = to 25% reduction in cervical and lumbar pain with ADLs and self-care  Baseline: 20% better overall (02/28/2022) Goal status: in progress   3.  Reduce fibromyalgia impact score to < or = to 39  Baseline: 28 (02/28/22) Goal status: MET    LONG TERM GOALS: Target date: 06/16/22  Be independent in advanced HEP Baseline: gentle exercise  Goal status: In progress   2.  Reduce fibromyalgia impact questionnaire to < or = to 21 Baseline: 28 (02/28/22) Goal status: Revised    3.  Lift and carry laundry and her grandchildren with > or = to 50% reduction in pain Baseline: 20% better (02/28/22) Goal status: In progress   4.  Report > or = to 60% reduction in neck and lumbar pain with ADLs and self-care  Baseline: 30-45% better (04/20/22) Goal status: revised  5.  Return to yardwork verbalize appropriate rest breaks and mechanics  Baseline: hasn't been doing due to being sick (02/28/22) Goal status: In progress    6. Report > or = to 30% reduction in Rt jaw pain with chewing and talking   Baseline 6/10   Goal Status: NEW PLAN: PT FREQUENCY: 2x/week  PT DURATION: 8 weeks  PLANNED INTERVENTIONS: Therapeutic exercises, Therapeutic activity, Neuromuscular re-education, Balance training, Gait training, Patient/Family education, Self Care, Joint mobilization, Aquatic Therapy, Dry Needling, Spinal manipulation, Spinal mobilization, Cryotherapy, Moist heat, Taping, Traction, Manual therapy, and Re-evaluation  PLAN FOR NEXT SESSION:  manual as needed, manage exercises as needed. Encourage gentle mobility. Aquatics to manage. chronic pain.    Myrene Galas, PTA 05/03/22 9:28 PM    [facility] [address], [place], [place] [address] Phone # [phone] Fax [phone]

## 2022-05-03 ENCOUNTER — Encounter: Payer: Self-pay | Admitting: Physical Therapy

## 2022-05-03 ENCOUNTER — Ambulatory Visit
Admission: RE | Admit: 2022-05-03 | Discharge: 2022-05-03 | Disposition: A | Discharge status: Home / Self Care | Payer: Medicare Other | Source: Ambulatory Visit | Attending: Family Medicine | Admitting: Family Medicine

## 2022-05-03 ENCOUNTER — Ambulatory Visit: Payer: Medicare Other | Admitting: Physical Therapy

## 2022-05-03 DIAGNOSIS — R293 Abnormal posture: Secondary | ICD-10-CM

## 2022-05-03 DIAGNOSIS — M6281 Muscle weakness (generalized): Secondary | ICD-10-CM | POA: Diagnosis not present

## 2022-05-03 DIAGNOSIS — R252 Cramp and spasm: Secondary | ICD-10-CM

## 2022-05-03 DIAGNOSIS — M542 Cervicalgia: Secondary | ICD-10-CM

## 2022-05-03 DIAGNOSIS — M353 Polymyalgia rheumatica: Secondary | ICD-10-CM | POA: Diagnosis not present

## 2022-05-03 DIAGNOSIS — Z1231 Encounter for screening mammogram for malignant neoplasm of breast: Secondary | ICD-10-CM | POA: Diagnosis not present

## 2022-05-04 ENCOUNTER — Ambulatory Visit (INDEPENDENT_AMBULATORY_CARE_PROVIDER_SITE_OTHER): Payer: Medicare Other | Admitting: Psychology

## 2022-05-04 DIAGNOSIS — F331 Major depressive disorder, recurrent, moderate: Secondary | ICD-10-CM

## 2022-05-04 DIAGNOSIS — F4310 Post-traumatic stress disorder, unspecified: Secondary | ICD-10-CM

## 2022-05-04 DIAGNOSIS — F411 Generalized anxiety disorder: Secondary | ICD-10-CM | POA: Diagnosis not present

## 2022-05-04 DIAGNOSIS — F902 Attention-deficit hyperactivity disorder, combined type: Secondary | ICD-10-CM | POA: Diagnosis not present

## 2022-05-04 NOTE — Progress Notes (Signed)
[facility] Counselor/Therapist Progress Note  Patient ID: Danielle Bruce, MRN: [id number],    Date: 05/04/2022  Time Spent: 60 minutes  Treatment Type: Individual Therapy  Reported Symptoms: flashbacks, responding to triggers, depression, anxiety, being busy all of the time, dissociation  Mental Status Exam: Appearance:  Casual     Behavior: Appropriate  Motor: Restlestness  Speech/Language:  Clear and Coherent  Affect: Blunt  Mood: pleasant  Thought process: normal  Thought content:   WNL  Sensory/Perceptual disturbances:   WNL  Orientation: oriented to person, place, time/date, and situation  Attention: Good  Concentration: Good  Memory: WNL  Fund of knowledge:  Good  Insight:   Good  Judgment:  Fair  Impulse Control: Good   Risk Assessment: Danger to Self:  No Self-injurious Behavior: No Danger to Others: No Duty to Warn:no Physical Aggression / Violence:No  Access to Firearms a concern: No  Gang Involvement:No   Subjective: The patient attended a face-to-face individual therapy session via video visit today.  The patient gave verbal consent for the session to be on caregility.  The patient was in her home alone and the therapist was in the office.  The patient presents as pleasant and cooperative.  We talked today about beginning EMDR in the coming weeks.  The patient has several weekly appointments scheduled in about 3 weeks and we will start looking at doing EMDR after that.  The patient talked today about what we are going to target and we discussed several traumatic incidents that we will look at individually with the EMDR.  Interventions: Cognitive Behavioral Therapy, Mindfulness Meditation, Eye Movement Desensitization and Reprocessing (EMDR), Insight-Oriented, and Interpersonal  Diagnosis:PTSD (post-traumatic stress disorder)  Generalized anxiety disorder  Major depressive disorder, recurrent episode, moderate (HCC)  Attention deficit  hyperactivity disorder (ADHD), combined type  Plan: Plan of Care: Client Abilities/Strengths  Insightful, motivated, Supportive family Client Treatment Preferences  Outpatient Individual therapy/EMDR  Client Statement of Needs  "I think I have PTSD and I feel like I need another kind of therapy than talk therapy" Treatment Level  Outpatient Individual therapy  Symptoms  Demonstrates an exaggerated startle response.:  (Status: maintained). Depressed  or irritable mood.: (Status:maintained). Describes a reliving of the event,  particularly through dissociative flashbacks.: (Status: maintained). Displays a  significant decline in interest and engagement in activities.:  (Status: maintained). Displays significant psychological and/or physiological distress resulting from internal and external  clues that are reminiscent of the traumatic event.: (Status: maintained).  Experiences disturbances in sleep.:  (Status: maintained). Experiences disturbing  and persistent thoughts, images, and/or perceptions of the traumatic event.:  (Status: maintained). Experiences frequent nightmares.: (Status: maintained).  Feelings of hopelessness, worthlessness, or inappropriate guilt.: No Description Entered (Status:  improved). Has been exposed to a traumatic event involving actual or perceived threat of death or  serious injury.:  (Status: maintained). Impairment in social, occupational, or  other areas of functioning.:(Status: maintained). Intentionally avoids activities,  places, people, or objects (e.g., up-armored vehicles) that evoke memories of the event.: (Status: maintained). Intentionally avoids thoughts, feelings, or discussions related  to the traumatic event.: (Status: maintained). Reports difficulty concentrating as  well as feelings of guilt (Status:maintained). Reports response of intense fear,  helplessness, or horror to the traumatic event.:  (Status: maintained).  Problems Addressed   Unipolar Depression, Posttraumatic Stress Disorder (PTSD), Posttraumatic Stress Disorder (PTSD),   Posttraumatic Stress Disorder (PTSD), Posttraumatic Stress Disorder (PTSD)  Goals 1. Develop healthy thinking patterns and beliefs about  self, others, and the world that lead to the alleviation and help prevent the relapse of  depression. Objective Identify and replace thoughts and beliefs that support depression. Target Date: 2023/01/04 Frequency: Weekly Progress: 0 Modality: individual Related Interventions 1. Explore and restructure underlying assumptions and beliefs reflected in biased self-talk that  may put the client at risk for relapse or recurrence. 2. Conduct Cognitive-Behavioral Therapy (see Cognitive Behavior Therapy by Olevia Bowens; Overcoming Depression by Lynita Lombard al.), beginning with helping the client learn the connection among  cognition, depressive feelings, and actions. 2. Eliminate or reduce the negative impact trauma related symptoms have  on social, occupational, and family functioning. Objective Learn and implement personal skills to manage challenging situations related to trauma. Target Date: 2023/01/04 Frequency: Weekly Progress: 0 Modality: individual 3. No longer avoids persons, places, activities, and objects that are  reminiscent of the traumatic event. Objective Participate in Eye Movement Desensitization and Reprocessing (EMDR) to reduce emotional distress  related to traumatic thoughts, feelings, and images. Target Date: 2023/01/04 Frequency: Weekly Progress: 0 Modality: individual   Related Interventions 1. Utilize Eye Movement Desensitization and Reprocessing (EMDR) to reduce the client's  emotional reactivity to the traumatic event and reduce PTSD symptoms. Objective Learn and implement guided self-dialogue to manage thoughts, feelings, and urges brought on by  encounters with trauma-related situations. Target Date: 2024-11/07 Frequency:  Weekly Progress: 0 Modality: individual Related Interventions 1. Teach the client a guided self-dialogue procedure in which he/she learns to recognize  maladaptive self-talk, challenges its biases, copes with engendered feelings, overcomes  avoidance, and reinforces his/her accomplishments; review and reinforce progress, problemsolve obstacles. 4. No longer experiences intrusive event recollections, avoidance of event  reminders, intense arousal, or disinterest in activities or  relationships. 5. Thinks about or openly discusses the traumatic event with others  without experiencing psychological or physiological distress. Diagnosis Axis  none F43.10 (Posttraumatic stress disorder) - Open - [Signifier: n/a] Posttraumatic Stress  Disorder  Conditions For Discharge Achievement of treatment goals and objectives   Kamylle Axelson G Yalda Herd, LCSW

## 2022-05-05 ENCOUNTER — Other Ambulatory Visit: Payer: Self-pay | Admitting: Psychiatry

## 2022-05-08 ENCOUNTER — Encounter: Payer: Self-pay | Admitting: Addiction (Substance Use Disorder)

## 2022-05-09 ENCOUNTER — Ambulatory Visit (HOSPITAL_BASED_OUTPATIENT_CLINIC_OR_DEPARTMENT_OTHER): Payer: Medicare Other

## 2022-05-09 ENCOUNTER — Encounter (HOSPITAL_BASED_OUTPATIENT_CLINIC_OR_DEPARTMENT_OTHER): Payer: Self-pay

## 2022-05-10 ENCOUNTER — Other Ambulatory Visit: Payer: Self-pay | Admitting: Gastroenterology

## 2022-05-10 ENCOUNTER — Ambulatory Visit: Payer: Medicare Other | Admitting: Physical Therapy

## 2022-05-10 DIAGNOSIS — R1013 Epigastric pain: Secondary | ICD-10-CM | POA: Diagnosis not present

## 2022-05-10 DIAGNOSIS — K449 Diaphragmatic hernia without obstruction or gangrene: Secondary | ICD-10-CM | POA: Diagnosis not present

## 2022-05-10 DIAGNOSIS — K52832 Lymphocytic colitis: Secondary | ICD-10-CM | POA: Diagnosis not present

## 2022-05-11 DIAGNOSIS — J45909 Unspecified asthma, uncomplicated: Secondary | ICD-10-CM | POA: Diagnosis not present

## 2022-05-11 DIAGNOSIS — M797 Fibromyalgia: Secondary | ICD-10-CM | POA: Diagnosis not present

## 2022-05-11 DIAGNOSIS — K5289 Other specified noninfective gastroenteritis and colitis: Secondary | ICD-10-CM | POA: Diagnosis not present

## 2022-05-11 DIAGNOSIS — R1013 Epigastric pain: Secondary | ICD-10-CM | POA: Diagnosis not present

## 2022-05-11 DIAGNOSIS — F4310 Post-traumatic stress disorder, unspecified: Secondary | ICD-10-CM | POA: Diagnosis not present

## 2022-05-15 ENCOUNTER — Ambulatory Visit: Payer: Medicare Other

## 2022-05-15 DIAGNOSIS — R293 Abnormal posture: Secondary | ICD-10-CM | POA: Diagnosis not present

## 2022-05-15 DIAGNOSIS — R252 Cramp and spasm: Secondary | ICD-10-CM

## 2022-05-15 DIAGNOSIS — M542 Cervicalgia: Secondary | ICD-10-CM | POA: Diagnosis not present

## 2022-05-15 DIAGNOSIS — M6281 Muscle weakness (generalized): Secondary | ICD-10-CM | POA: Diagnosis not present

## 2022-05-15 DIAGNOSIS — M353 Polymyalgia rheumatica: Secondary | ICD-10-CM

## 2022-05-15 NOTE — Therapy (Signed)
OUTPATIENT PHYSICAL THERAPY TREATMENT   Patient Name: Danielle Bruce MRN: [id number] DOB:24-Jun-1960, 62 y.o., female Today's Date: 05/15/2022        PT End of Session - 05/15/22 1230     Visit Number 34    Date for PT Re-Evaluation 06/16/22    Authorization Type Medicare B- KX now    Progress Note Due on Visit 67    PT Start Time 1159   late   PT Stop Time 1230    PT Time Calculation (min) 31 min    Activity Tolerance Patient tolerated treatment well    Behavior During Therapy [facility] for tasks assessed/performed                                     Past Medical History:  Diagnosis Date   Abdominal pain, unspecified site 10/18/2012   Anemia    Anxiety    Arthritis    osteoarthritis   ASCUS (atypical squamous cells of undetermined significance) on Pap smear 05/06/2005   NEG HIGH RISK HPV--C&B BIOPSY BENIGN 12/2005   Asthma    Bipolar 2 disorder ([place])    Cancer ([place])    skin cancer - basal cell   Colon polyps    Complication of anesthesia    anxious afterwards, will get headaches    Constipation    Depression    Fibromyalgia 10/2013   GERD (gastroesophageal reflux disease)    Hearing loss on left    Heart murmur    never had any problems   Hemorrhoids    High cholesterol    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   cytology negative   IBS (irritable bowel syndrome)    Insomnia    Lymphocytic colitis    Lymphoma (H[facility])    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   Bone marrow biopsy showes 8% plasma cells IgA Lambda   Migraines    Nausea alone 10/18/2012   Osteoarthritis    Osteopenia    Peripheral neuropathy    PTSD (post-traumatic stress disorder)    Past Surgical History:  Procedure Laterality Date   BONE MARROW BIOPSY Left 12/18/2013   Plasma cell dyscrasia 8% population of plasma cells   BREAST BIOPSY Right    benign stereo   CESAREAN SECTION  [id number]   CHOLECYSTECTOMY N/A 10/16/2012   Procedure: LAPAROSCOPIC  CHOLECYSTECTOMY;  Surgeon: Joyice Faster. Cornett, MD;  Location: [facility];  Service: General;  Laterality: N/A;   COLONOSCOPY     numerous times   DILATION AND CURETTAGE OF UTERUS     ESOPHAGOGASTRODUODENOSCOPY     HEMORRHOID SURGERY  1993   x3   IUD REMOVAL  02/2015   Mirena   LAPAROSCOPIC LYSIS OF ADHESIONS N/A 10/16/2012   Procedure: LAPAROSCOPIC LYSIS OF ADHESIONS;  Surgeon: Marcello Moores A. Cornett, MD;  Location: [facility];  Service: General;  Laterality: N/A;   LAPAROSCOPY N/A 10/16/2012   Procedure: LAPAROSCOPY DIAGNOSTIC;  Surgeon: Joyice Faster. Cornett, MD;  Location: [facility];  Service: General;  Laterality: N/A;   PELVIC LAPAROSCOPY     RADIOLOGY WITH ANESTHESIA N/A 12/16/2015   Procedure: MRI OF BRAIN WITH AND WITHOUT CONTRAST;  Surgeon: Medication Radiologist, MD;  Location: [facility];  Service: Radiology;  Laterality: N/A;   SHOULDER SURGERY  2007/2008   SPINE SURGERY  2010   cervical   Patient Active Problem List   Diagnosis Date Noted   GAD (  generalized anxiety disorder) 12/26/2017   OCD (obsessive compulsive disorder) 12/26/2017   PTSD (post-traumatic stress disorder) 12/26/2017   DDD (degenerative disc disease), cervical 07/14/2016   Primary osteoarthritis of both feet 07/14/2016   Primary osteoarthritis of both hands 07/14/2016   Other fatigue 07/14/2016   History of IBS 07/14/2016   Osteopenia of multiple sites 07/14/2016   Fibromyalgia 01/13/2016   MGUS (monoclonal gammopathy of unknown significance) 12/23/2013   Chronic cholecystitis without calculus 10/18/2012   Abdominal pain, unspecified site 10/18/2012   Nausea alone 10/18/2012   Unspecified constipation 10/18/2012   Depression 10/18/2012   Anxiety    ASCUS (atypical squamous cells of undetermined significance) on Pap smear    IUD    Hemorrhoids 11/29/2010   Abdominal pain, left upper quadrant 11/29/2010    PCP:  Mayra Neer, MD  REFERRING PROVIDER: Mayra Neer, MD  REFERRING DIAG: fibromyalgia, TMJ dysfunction  (added 04/20/22)  THERAPY DIAG:  Abnormal posture  Muscle weakness (generalized)  Cramp and spasm  Polymyalgia rheumatica ([place])  Rationale for Evaluation and Treatment Rehabilitation  ONSET DATE: chronic pain (fibromyalgia) with flare-up 2 months ago  SUBJECTIVE:                                                                                                                                                                                                         SUBJECTIVE STATEMENT:  the dry needling to my jaw really helped. I haven't noticed much pain there recently.  My back has been hurting a lot more this week.    PERTINENT HISTORY:  Anxiety, bipolar disorder, depression, fibromyalgia, IBS, migraines, osteopenia, PTSD  PAIN:  Are you having pain?Yes 5/10 Pain location: mid to low back TMJ 0/10- chewing sometimes just hurts  Pain description: sore, tight  Aggravating factors: activity, yard work, picking up heavy objects Relieving factors: muscle relaxers, rest, stretching  PRECAUTIONS: Other: chronic pain syndrome and depression Other: slow progression with exercise due to chronic condition  WEIGHT BEARING RESTRICTIONS No  FALLS:  Has patient fallen in last 6 months? No  LIVING ENVIRONMENT: Lives with: lives with their family Lives in: House/apartment  OCCUPATION: on disability  PLOF: Independent with basic ADLs and Leisure: yardwork, walking  Pt cares for her 61 young grandchildren- difficulty with care including lifting and carrying  PATIENT GOALS be more active with less pain, exercise at the gym regularly, lift and carry grandchildren and laundry, sleep with fewer interruptions, get back to more gardening.  OBJECTIVE:   DIAGNOSTIC FINDINGS: none recent   PATIENT SURVEYS: Eval: Fibromyalgia Impact Questionnaire: first  2 sections only: 49 12/26/21: 40  02/28/22: 28 (first 2 sections only)  COGNITION: Overall cognitive status: Within functional limits for  tasks assessed  SENSATION: WFL  POSTURE: rounded shoulders and forward head  PALPATION: Diffuse palpable tenderness over bil neck, upper traps, thoracic, lumbar and gluteals with trigger points.    CERVICAL ROM:   Active ROM A/PROM (deg) eval A/ROM 12/26/21 A/ROM 04/20/22  Flexion 55    Extension 40    Right lateral flexion 30 35 40  Left lateral flexion 35 40 45  Right rotation 50 60 65  Left rotation 40 65 65   (Blank rows = not tested) TMJ: opening limited by 81%, clicking with opening on the Rt.  Palpable tenderness over Rt masseter and at TMJ UPPER EXTREMITY ROM: UE A/ROM is limited by 20% into flexion and abduction with pain at end range.  Hip flexibility is limited by 25% in all directions with pain in all directions.  UPPER EXTREMITY MMT: UE: 4+/5, LE 4+/5 except hip flexors 4-/5  TODAY'S TREATMENT:  Date: 05/15/22 NuStep: Level 3 x 8 minutes for endurance and mobility-PT present to discuss progress. Ball roll outs forward and lateral x10 each Trigger Point Dry-Needling  Treatment instructions: Expect mild to moderate muscle soreness. S/S of pneumothorax if dry needled over a lung field, and to seek immediate medical attention should they occur. Patient verbalized understanding of these instructions and education.  Patient Consent Given: Yes Education handout provided: Previously provided Muscles treated: bil thoracic multifidi T6-12 and lumbar L1-5 Treatment response/outcome: Utilized skilled palpation to identify trigger points.  During dry needling able to palpate muscle twitch and muscle elongation  Elongation and release to bil thoracic and lumbar  Skilled palpation and monitoring by PT during dry needling    05/03/22: Pt arrives for aquatic physical therapy. Treatment took place in 3.5-5.5 feet of water. Water temperature was 91 degrees F. Pt entered the pool via stairs reciprocally with mild use of rails. Pt requires buoyancy of water for support and to  offload joints with strengthening exercises. Pt utilizes viscosity of the water required for strengthening. Seated water bench with 75% submersion Pt performed seated LE AROM exercises 20x in all planes,   Standing in 50%- 75% depth water pt performed water walking in all 4 directions 10x. VC for speed in order to generate appropriate current for resistance and/or UE movements. Water bells and UE single buoy wts used for push/pull with UE. 50% depth standing: knee ext Bil 15x2 with small noodle, single buoy UE wt push and hold with 6 lengths of high knee marching, hip 3 ways with ankle fins 20x required some UE for balance today, core/lat press 20x with double buoy wts, 5 min horseback bicycle on yellow noodle then 5 min bicycle holding just the double buoy UE weights.     04/28/22:Pt arrives for aquatic physical therapy. Treatment took place in 3.5-5.5 feet of water. Water temperature was 91 degrees F. Pt entered the pool via stairs reciprocally with mild use of rails. Pt requires buoyancy of water for support and to offload joints with strengthening exercises. Pt utilizes viscosity of the water required for strengthening. Seated water bench with 75% submersion Pt performed seated LE AROM exercises 20x in all planes,   Standing in 50%- 75% depth water pt performed water walking in all 4 directions 10x. VC for speed in order to generate appropriate current for resistance and/or UE movements. Water bells and UE single buoy wts used for push/pull with  UE. 50% depth standing: knee ext Bil 15x2 with small noodle, single buoy UE wt push and hold with 6 lengths of high knee marching, hip 3 ways with ankle fins 20x required some UE for balance today, core/lat press 20x with double buoy wts, 5 min horseback bicycle on yellow noodle then 5 min bicycle holding just the double buoy UE weights.      HOME EXERCISE PROGRAM: Access Code: [email or web address] Access Code: [email or web address] URL:  [email or web address] Date: 04/20/2022 Prepared by: Claiborne Billings  Exercises - - Controlled Jaw Opening with Tongue on Roof of Mouth  - 3 x daily - 7 x weekly - 1 sets - 10 reps - Jaw Protraction  - 3 x daily - 7 x weekly - 1 sets - 10 reps ASSESSMENT:  CLINICAL IMPRESSION: Pt had relief of jaw pain after DN last session.  She has had increased LBP over the past week and session focused on this.  Pt is responding well to aquatics to manage her chronic pain.  Pt with good response to DN today with improved tissue mobility and reduce pain after manual therapy today.  Limited treatment time due to pt being late for appt. Patient will benefit from skilled PT to address the below impairments and improve overall function.   OBJECTIVE IMPAIRMENTS decreased activity tolerance, decreased endurance, difficulty walking, decreased strength, increased muscle spasms, impaired flexibility, improper body mechanics, postural dysfunction, and pain.   ACTIVITY LIMITATIONS carrying, lifting, sitting, standing, reach over head, hygiene/grooming, and locomotion level  PARTICIPATION LIMITATIONS: meal prep, cleaning, laundry, driving, community activity, and yard work  PERSONAL FACTORS Past/current experiences, Time since onset of injury/illness/exacerbation, and 3+ comorbidities: fibromyalgia depression, anxiety,   are also affecting patient's functional outcome.   REHAB POTENTIAL: Good  CLINICAL DECISION MAKING: Evolving/moderate complexity  EVALUATION COMPLEXITY: Moderate   GOALS: Goals reviewed with patient? Yes  SHORT TERM GOALS: Target date: 03/20/22  Return to regular performance of HEP 2-3x/day to improve mobility  Baseline: stretching 2x/day (02/28/22) Goal status: In progress  2.  Report > or = to 25% reduction in cervical and lumbar pain with ADLs and self-care  Baseline: 20% better overall (02/28/2022) Goal status: in progress   3.  Reduce fibromyalgia impact score to < or = to 39   Baseline: 28 (02/28/22) Goal status: MET    LONG TERM GOALS: Target date: 06/16/22  Be independent in advanced HEP Baseline: gentle exercise  Goal status: In progress   2.  Reduce fibromyalgia impact questionnaire to < or = to 21 Baseline: 28 (02/28/22) Goal status: Revised   3.  Lift and carry laundry and her grandchildren with > or = to 50% reduction in pain Baseline: 20% better (02/28/22) Goal status: In progress   4.  Report > or = to 60% reduction in neck and lumbar pain with ADLs and self-care  Baseline: 30-45% better (04/20/22) Goal status: revised  5.  Return to yardwork verbalize appropriate rest breaks and mechanics  Baseline: hasn't been doing due to being sick (02/28/22) Goal status: In progress    6. Report > or = to 30% reduction in Rt jaw pain with chewing and talking   Baseline 6/10   Goal Status: NEW PLAN: PT FREQUENCY: 2x/week  PT DURATION: 8 weeks  PLANNED INTERVENTIONS: Therapeutic exercises, Therapeutic activity, Neuromuscular re-education, Balance training, Gait training, Patient/Family education, Self Care, Joint mobilization, Aquatic Therapy, Dry Needling, Spinal manipulation, Spinal mobilization, Cryotherapy, Moist heat, Taping, Traction, Manual therapy, and Re-evaluation  PLAN FOR NEXT SESSION:  manual  as needed, manage exercises as needed. Encourage gentle mobility. Aquatics to manage. chronic pain.    Sigurd Sos, PT 05/15/22 12:33 PM   [facility] [address] [place], [place] [address] Phone # [phone] Fax [phone]

## 2022-05-16 NOTE — Therapy (Signed)
OUTPATIENT PHYSICAL THERAPY TREATMENT   Patient Name: Danielle Bruce MRN: [id number] DOB:[birth year], 62 y.o., female Today's Date: 05/17/2022        PT End of Session - 05/17/22 2103     Visit Number 35    Date for PT Re-Evaluation 06/16/22    Authorization Type Medicare B- KX now    Progress Note Due on Visit 7    PT Start Time 1100    PT Stop Time 1145    PT Time Calculation (min) 45 min    Activity Tolerance Patient tolerated treatment well                                      Past Medical History:  Diagnosis Date   Abdominal pain, unspecified site 10/18/2012   Anemia    Anxiety    Arthritis    osteoarthritis   ASCUS (atypical squamous cells of undetermined significance) on Pap smear 05/06/2005   NEG HIGH RISK HPV--C&B BIOPSY BENIGN 12/2005   Asthma    Bipolar 2 disorder ([place])    Cancer ([place])    skin cancer - basal cell   Colon polyps    Complication of anesthesia    anxious afterwards, will get headaches    Constipation    Depression    Fibromyalgia 10/2013   GERD (gastroesophageal reflux disease)    Hearing loss on left    Heart murmur    never had any problems   Hemorrhoids    High cholesterol    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   cytology negative   IBS (irritable bowel syndrome)    Insomnia    Lymphocytic colitis    Lymphoma ([facility])    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   Bone marrow biopsy showes 8% plasma cells IgA Lambda   Migraines    Nausea alone 10/18/2012   Osteoarthritis    Osteopenia    Peripheral neuropathy    PTSD (post-traumatic stress disorder)    Past Surgical History:  Procedure Laterality Date   BONE MARROW BIOPSY Left 12/18/2013   Plasma cell dyscrasia 8% population of plasma cells   BREAST BIOPSY Right    benign stereo   CESAREAN SECTION  [id number]   CHOLECYSTECTOMY N/A 10/16/2012   Procedure: LAPAROSCOPIC CHOLECYSTECTOMY;  Surgeon: Joyice Faster. Cornett, MD;  Location: [facility];  Service: General;  Laterality: N/A;   COLONOSCOPY     numerous times   DILATION AND CURETTAGE OF UTERUS     ESOPHAGOGASTRODUODENOSCOPY     HEMORRHOID SURGERY  1993   x3   IUD REMOVAL  02/2015   Mirena   LAPAROSCOPIC LYSIS OF ADHESIONS N/A 10/16/2012   Procedure: LAPAROSCOPIC LYSIS OF ADHESIONS;  Surgeon: Marcello Moores A. Cornett, MD;  Location: [facility];  Service: General;  Laterality: N/A;   LAPAROSCOPY N/A 10/16/2012   Procedure: LAPAROSCOPY DIAGNOSTIC;  Surgeon: Joyice Faster. Cornett, MD;  Location: [facility];  Service: General;  Laterality: N/A;   PELVIC LAPAROSCOPY     RADIOLOGY WITH ANESTHESIA N/A 12/16/2015   Procedure: MRI OF BRAIN WITH AND WITHOUT CONTRAST;  Surgeon: Medication Radiologist, MD;  Location: [facility];  Service: Radiology;  Laterality: N/A;   SHOULDER SURGERY  2007/2008   SPINE SURGERY  2010   cervical   Patient Active Problem List   Diagnosis Date Noted   GAD (generalized anxiety disorder) 12/26/2017   OCD (obsessive compulsive disorder) 12/26/2017  PTSD (post-traumatic stress disorder) 12/26/2017   DDD (degenerative disc disease), cervical 07/14/2016   Primary osteoarthritis of both feet 07/14/2016   Primary osteoarthritis of both hands 07/14/2016   Other fatigue 07/14/2016   History of IBS 07/14/2016   Osteopenia of multiple sites 07/14/2016   Fibromyalgia 01/13/2016   MGUS (monoclonal gammopathy of unknown significance) 12/23/2013   Chronic cholecystitis without calculus 10/18/2012   Abdominal pain, unspecified site 10/18/2012   Nausea alone 10/18/2012   Unspecified constipation 10/18/2012   Depression 10/18/2012   Anxiety    ASCUS (atypical squamous cells of undetermined significance) on Pap smear    IUD    Hemorrhoids 11/29/2010   Abdominal pain, left upper quadrant 11/29/2010    PCP:  Mayra Neer, MD  REFERRING PROVIDER: Mayra Neer, MD  REFERRING DIAG: fibromyalgia, TMJ dysfunction (added 04/20/22)  THERAPY DIAG:  Abnormal posture  Muscle  weakness (generalized)  Cramp and spasm  Polymyalgia rheumatica ([place])  Cervicalgia  Rationale for Evaluation and Treatment Rehabilitation  ONSET DATE: chronic pain (fibromyalgia) with flare-up 2 months ago  SUBJECTIVE:                                                                                                                                                                                                         SUBJECTIVE STATEMENT:  Sorry I missed last week, I had a GI issue. I feel great today.  PERTINENT HISTORY:  Anxiety, bipolar disorder, depression, fibromyalgia, IBS, migraines, osteopenia, PTSD  PAIN:  Are you having pain?No 0/10 Pain location:  TMJ 0/10- chewing sometimes just hurts  Pain description: sore, tight  Aggravating factors: activity, yard work, picking up heavy objects Relieving factors: muscle relaxers, rest, stretching  PRECAUTIONS: Other: chronic pain syndrome and depression Other: slow progression with exercise due to chronic condition  WEIGHT BEARING RESTRICTIONS No  FALLS:  Has patient fallen in last 6 months? No  LIVING ENVIRONMENT: Lives with: lives with their family Lives in: House/apartment  OCCUPATION: on disability  PLOF: Independent with basic ADLs and Leisure: yardwork, walking  Pt cares for her 74 young grandchildren- difficulty with care including lifting and carrying  PATIENT GOALS be more active with less pain, exercise at the gym regularly, lift and carry grandchildren and laundry, sleep with fewer interruptions, get back to more gardening.  OBJECTIVE:   DIAGNOSTIC FINDINGS: none recent   PATIENT SURVEYS: Eval: Fibromyalgia Impact Questionnaire: first 2 sections only: 49 12/26/21: 40  02/28/22: 28 (first 2 sections only)  COGNITION: Overall cognitive status: Within functional limits for tasks assessed  SENSATION: [facility]  POSTURE: rounded shoulders and forward head  PALPATION: Diffuse palpable tenderness over bil neck,  upper traps, thoracic, lumbar and gluteals with trigger points.    CERVICAL ROM:   Active ROM A/PROM (deg) eval A/ROM 12/26/21 A/ROM 04/20/22  Flexion 55    Extension 40    Right lateral flexion 30 35 40  Left lateral flexion 35 40 45  Right rotation 50 60 65  Left rotation 40 65 65   (Blank rows = not tested) TMJ: opening limited by [id number], clicking with opening on the Rt.  Palpable tenderness over Rt masseter and at TMJ UPPER EXTREMITY ROM: UE A/ROM is limited by 20% into flexion and abduction with pain at end range.  Hip flexibility is limited by 25% in all directions with pain in all directions.  UPPER EXTREMITY MMT: UE: 4+/5, LE 4+/5 except hip flexors 4-/5  TODAY'S TREATMENT:   05/17/22:Pt arrives for aquatic physical therapy. Treatment took place in 3.5-5.5 feet of water. Water temperature was 91 degrees F. Pt entered the pool via stairs reciprocally with mild use of rails. Pt requires buoyancy of water for support and to offload joints with strengthening exercises. Pt utilizes viscosity of the water required for strengthening. Seated water bench with 75% submersion Pt performed seated LE AROM exercises 20x in all planes,   Standing in 50%- 75% depth water pt performed water walking in all 4 directions 10x. VC for speed in order to generate appropriate current for resistance and/or UE movements. Water bells and UE single buoy wts used for push/pull with UE. 50% depth standing: knee ext Bil 15x2 with small noodle, single buoy UE wt push and hold with 6 lengths of high knee marching, hip 3 ways with ankle fins 20x required some UE for balance today, core/lat press 20x with double buoy wts, 8 min horseback bicycle on yellow noodle.  Date: 05/15/22 NuStep: Level 3 x 8 minutes for endurance and mobility-PT present to discuss progress. Ball roll outs forward and lateral x10 each Trigger Point Dry-Needling  Treatment instructions: Expect mild to moderate muscle soreness. S/S of  pneumothorax if dry needled over a lung field, and to seek immediate medical attention should they occur. Patient verbalized understanding of these instructions and education.  Patient Consent Given: Yes Education handout provided: Previously provided Muscles treated: bil thoracic multifidi T6-12 and lumbar L1-5 Treatment response/outcome: Utilized skilled palpation to identify trigger points.  During dry needling able to palpate muscle twitch and muscle elongation  Elongation and release to bil thoracic and lumbar  Skilled palpation and monitoring by PT during dry needling    05/03/22: Pt arrives for aquatic physical therapy. Treatment took place in 3.5-5.5 feet of water. Water temperature was 91 degrees F. Pt entered the pool via stairs reciprocally with mild use of rails. Pt requires buoyancy of water for support and to offload joints with strengthening exercises. Pt utilizes viscosity of the water required for strengthening. Seated water bench with 75% submersion Pt performed seated LE AROM exercises 20x in all planes,   Standing in 50%- 75% depth water pt performed water walking in all 4 directions 10x. VC for speed in order to generate appropriate current for resistance and/or UE movements. Water bells and UE single buoy wts used for push/pull with UE. 50% depth standing: knee ext Bil 15x2 with small noodle, single buoy UE wt push and hold with 6 lengths of high knee marching, hip 3 ways with ankle fins 20x required some UE for balance today, core/lat press 20x  with double buoy wts, 5 min horseback bicycle on yellow noodle then 5 min bicycle holding just the double buoy UE weights.       HOME EXERCISE PROGRAM: Access Code: [email or web address] Access Code: [email or web address] URL: [email or web address] Date: 04/20/2022 Prepared by: Claiborne Billings  Exercises - - Controlled Jaw Opening with Tongue on Roof of Mouth  - 3 x daily - 7 x weekly - 1 sets - 10 reps - Jaw Protraction  - 3 x daily - 7 x weekly  - 1 sets - 10 reps ASSESSMENT:  CLINICAL IMPRESSION: Pt presents to aquatic PT session with excellent energy and generated a good current throughout her entire session. No pain.  OBJECTIVE IMPAIRMENTS decreased activity tolerance, decreased endurance, difficulty walking, decreased strength, increased muscle spasms, impaired flexibility, improper body mechanics, postural dysfunction, and pain.   ACTIVITY LIMITATIONS carrying, lifting, sitting, standing, reach over head, hygiene/grooming, and locomotion level  PARTICIPATION LIMITATIONS: meal prep, cleaning, laundry, driving, community activity, and yard work  PERSONAL FACTORS Past/current experiences, Time since onset of injury/illness/exacerbation, and 3+ comorbidities: fibromyalgia depression, anxiety,   are also affecting patient's functional outcome.   REHAB POTENTIAL: Good  CLINICAL DECISION MAKING: Evolving/moderate complexity  EVALUATION COMPLEXITY: Moderate   GOALS: Goals reviewed with patient? Yes  SHORT TERM GOALS: Target date: 03/20/22  Return to regular performance of HEP 2-3x/day to improve mobility  Baseline: stretching 2x/day (02/28/22) Goal status: In progress  2.  Report > or = to 25% reduction in cervical and lumbar pain with ADLs and self-care  Baseline: 20% better overall (02/28/2022) Goal status: in progress   3.  Reduce fibromyalgia impact score to < or = to 39  Baseline: 28 (02/28/22) Goal status: MET    LONG TERM GOALS: Target date: 06/16/22  Be independent in advanced HEP Baseline: gentle exercise  Goal status: In progress   2.  Reduce fibromyalgia impact questionnaire to < or = to 21 Baseline: 28 (02/28/22) Goal status: Revised   3.  Lift and carry laundry and her grandchildren with > or = to 50% reduction in pain Baseline: 20% better (02/28/22) Goal status: In progress   4.  Report > or = to 60% reduction in neck and lumbar pain with ADLs and self-care  Baseline: 30-45% better (04/20/22) Goal status:  revised  5.  Return to yardwork verbalize appropriate rest breaks and mechanics  Baseline: hasn't been doing due to being sick (02/28/22) Goal status: In progress    6. Report > or = to 30% reduction in Rt jaw pain with chewing and talking   Baseline 6/10   Goal Status: NEW PLAN: PT FREQUENCY: 2x/week  PT DURATION: 8 weeks  PLANNED INTERVENTIONS: Therapeutic exercises, Therapeutic activity, Neuromuscular re-education, Balance training, Gait training, Patient/Family education, Self Care, Joint mobilization, Aquatic Therapy, Dry Needling, Spinal manipulation, Spinal mobilization, Cryotherapy, Moist heat, Taping, Traction, Manual therapy, and Re-evaluation  PLAN FOR NEXT SESSION:  manual as needed, manage exercises as needed. Encourage gentle mobility. Aquatics to manage. chronic pain.    Myrene Galas, PTA 05/17/22 9:04 PM   [facility] [address], [place] [address] Phone # [phone] Fax [phone]

## 2022-05-17 ENCOUNTER — Encounter: Payer: Self-pay | Admitting: Physical Therapy

## 2022-05-17 ENCOUNTER — Ambulatory Visit: Payer: Medicare Other | Admitting: Physical Therapy

## 2022-05-17 DIAGNOSIS — M542 Cervicalgia: Secondary | ICD-10-CM | POA: Diagnosis not present

## 2022-05-17 DIAGNOSIS — R293 Abnormal posture: Secondary | ICD-10-CM

## 2022-05-17 DIAGNOSIS — M353 Polymyalgia rheumatica: Secondary | ICD-10-CM | POA: Diagnosis not present

## 2022-05-17 DIAGNOSIS — M6281 Muscle weakness (generalized): Secondary | ICD-10-CM | POA: Diagnosis not present

## 2022-05-17 DIAGNOSIS — R252 Cramp and spasm: Secondary | ICD-10-CM

## 2022-05-19 DIAGNOSIS — R1013 Epigastric pain: Secondary | ICD-10-CM | POA: Diagnosis not present

## 2022-05-19 DIAGNOSIS — Z9049 Acquired absence of other specified parts of digestive tract: Secondary | ICD-10-CM | POA: Diagnosis not present

## 2022-05-29 NOTE — Telephone Encounter (Signed)
Pt LVM  3/29@12 :32p.  She wanted to know if the script for Vyvanse 40 mg could be increased to 50mg  as she and Dr Clovis Pu had discussed.  If so, pls send new script in.  Next appt 5/30

## 2022-05-31 ENCOUNTER — Ambulatory Visit: Payer: Medicare Other | Attending: Family Medicine

## 2022-05-31 ENCOUNTER — Other Ambulatory Visit: Payer: Self-pay | Admitting: Psychiatry

## 2022-05-31 DIAGNOSIS — F4310 Post-traumatic stress disorder, unspecified: Secondary | ICD-10-CM

## 2022-05-31 DIAGNOSIS — R252 Cramp and spasm: Secondary | ICD-10-CM

## 2022-05-31 DIAGNOSIS — F4001 Agoraphobia with panic disorder: Secondary | ICD-10-CM

## 2022-05-31 DIAGNOSIS — F411 Generalized anxiety disorder: Secondary | ICD-10-CM

## 2022-05-31 DIAGNOSIS — F422 Mixed obsessional thoughts and acts: Secondary | ICD-10-CM

## 2022-05-31 DIAGNOSIS — M542 Cervicalgia: Secondary | ICD-10-CM | POA: Diagnosis not present

## 2022-05-31 DIAGNOSIS — R293 Abnormal posture: Secondary | ICD-10-CM | POA: Insufficient documentation

## 2022-05-31 DIAGNOSIS — F331 Major depressive disorder, recurrent, moderate: Secondary | ICD-10-CM

## 2022-05-31 DIAGNOSIS — M353 Polymyalgia rheumatica: Secondary | ICD-10-CM | POA: Diagnosis not present

## 2022-05-31 DIAGNOSIS — M6281 Muscle weakness (generalized): Secondary | ICD-10-CM

## 2022-05-31 DIAGNOSIS — F5105 Insomnia due to other mental disorder: Secondary | ICD-10-CM

## 2022-05-31 NOTE — Therapy (Signed)
OUTPATIENT PHYSICAL THERAPY TREATMENT   Patient Name: Danielle Bruce MRN: [id number] DOB:1960-07-21, 62 y.o., female Today's Date: 05/31/2022        PT End of Session - 05/31/22 1058     Visit Number 36    Authorization Type Medicare B- KX now    Progress Note Due on Visit 9    PT Start Time 1021    PT Stop Time 1059    PT Time Calculation (min) 38 min    Activity Tolerance Patient tolerated treatment well    Behavior During Therapy [facility] for tasks assessed/performed                                       Past Medical History:  Diagnosis Date   Abdominal pain, unspecified site 10/18/2012   Anemia    Anxiety    Arthritis    osteoarthritis   ASCUS (atypical squamous cells of undetermined significance) on Pap smear 05/06/2005   NEG HIGH RISK HPV--C&B BIOPSY BENIGN 12/2005   Asthma    Bipolar 2 disorder    Cancer    skin cancer - basal cell   Colon polyps    Complication of anesthesia    anxious afterwards, will get headaches    Constipation    Depression    Fibromyalgia 10/2013   GERD (gastroesophageal reflux disease)    Hearing loss on left    Heart murmur    never had any problems   Hemorrhoids    High cholesterol    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   cytology negative   IBS (irritable bowel syndrome)    Insomnia    Lymphocytic colitis    Lymphoma    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   Bone marrow biopsy showes 8% plasma cells IgA Lambda   Migraines    Nausea alone 10/18/2012   Osteoarthritis    Osteopenia    Peripheral neuropathy    PTSD (post-traumatic stress disorder)    Past Surgical History:  Procedure Laterality Date   BONE MARROW BIOPSY Left 12/18/2013   Plasma cell dyscrasia 8% population of plasma cells   BREAST BIOPSY Right    benign stereo   CESAREAN SECTION  [id number]   CHOLECYSTECTOMY N/A 10/16/2012   Procedure: LAPAROSCOPIC CHOLECYSTECTOMY;  Surgeon: Joyice Faster. Cornett, MD;  Location:  [facility];  Service: General;  Laterality: N/A;   COLONOSCOPY     numerous times   DILATION AND CURETTAGE OF UTERUS     ESOPHAGOGASTRODUODENOSCOPY     HEMORRHOID SURGERY  1993   x3   IUD REMOVAL  02/2015   Mirena   LAPAROSCOPIC LYSIS OF ADHESIONS N/A 10/16/2012   Procedure: LAPAROSCOPIC LYSIS OF ADHESIONS;  Surgeon: Marcello Moores A. Cornett, MD;  Location: [facility];  Service: General;  Laterality: N/A;   LAPAROSCOPY N/A 10/16/2012   Procedure: LAPAROSCOPY DIAGNOSTIC;  Surgeon: Joyice Faster. Cornett, MD;  Location: [facility];  Service: General;  Laterality: N/A;   PELVIC LAPAROSCOPY     RADIOLOGY WITH ANESTHESIA N/A 12/16/2015   Procedure: MRI OF BRAIN WITH AND WITHOUT CONTRAST;  Surgeon: Medication Radiologist, MD;  Location: [facility];  Service: Radiology;  Laterality: N/A;   SHOULDER SURGERY  2007/2008   SPINE SURGERY  2010   cervical   Patient Active Problem List   Diagnosis Date Noted   GAD (generalized anxiety disorder) 12/26/2017   OCD (obsessive compulsive disorder) 12/26/2017  PTSD (post-traumatic stress disorder) 12/26/2017   DDD (degenerative disc disease), cervical 07/14/2016   Primary osteoarthritis of both feet 07/14/2016   Primary osteoarthritis of both hands 07/14/2016   Other fatigue 07/14/2016   History of IBS 07/14/2016   Osteopenia of multiple sites 07/14/2016   Fibromyalgia 01/13/2016   MGUS (monoclonal gammopathy of unknown significance) 12/23/2013   Chronic cholecystitis without calculus 10/18/2012   Abdominal pain, unspecified site 10/18/2012   Nausea alone 10/18/2012   Unspecified constipation 10/18/2012   Depression 10/18/2012   Anxiety    ASCUS (atypical squamous cells of undetermined significance) on Pap smear    IUD    Hemorrhoids 11/29/2010   Abdominal pain, left upper quadrant 11/29/2010    PCP:  Mayra Neer, MD  REFERRING PROVIDER: Mayra Neer, MD  REFERRING DIAG: fibromyalgia, TMJ dysfunction (added 04/20/22)  THERAPY DIAG:  Abnormal posture  Cramp  and spasm  Muscle weakness (generalized)  Polymyalgia rheumatica  Cervicalgia  Rationale for Evaluation and Treatment Rehabilitation  ONSET DATE: chronic pain (fibromyalgia) with flare-up 2 months ago  SUBJECTIVE:                                                                                                                                                                                                         SUBJECTIVE STATEMENT:  I am really hurting today in the Lt side of my neck.  If I wasn't hurting today, I would take some pian meds.  I don't think I can exercise.   PERTINENT HISTORY:  Anxiety, bipolar disorder, depression, fibromyalgia, IBS, migraines, osteopenia, PTSD  PAIN:  Are you having pain?No 7/10 Pain location: Lt neck TMJ 0/10- chewing sometimes just hurts  Pain description: sore, tight  Aggravating factors: movement Relieving factors: muscle relaxers, rest, stretching  PRECAUTIONS: Other: chronic pain syndrome and depression Other: slow progression with exercise due to chronic condition  WEIGHT BEARING RESTRICTIONS No  FALLS:  Has patient fallen in last 6 months? No  LIVING ENVIRONMENT: Lives with: lives with their family Lives in: House/apartment  OCCUPATION: on disability  PLOF: Independent with basic ADLs and Leisure: yardwork, walking  Pt cares for her 27 young grandchildren- difficulty with care including lifting and carrying  PATIENT GOALS be more active with less pain, exercise at the gym regularly, lift and carry grandchildren and laundry, sleep with fewer interruptions, get back to more gardening.  OBJECTIVE:   DIAGNOSTIC FINDINGS: none recent   PATIENT SURVEYS: Eval: Fibromyalgia Impact Questionnaire: first 2 sections only: 49 12/26/21: 40  02/28/22: 28 (first 2 sections only)  COGNITION: Overall  cognitive status: Within functional limits for tasks assessed  SENSATION: WFL  POSTURE: rounded shoulders and forward  head  PALPATION: Diffuse palpable tenderness over bil neck, upper traps, thoracic, lumbar and gluteals with trigger points.    CERVICAL ROM:   Active ROM A/PROM (deg) eval A/ROM 12/26/21 A/ROM 04/20/22  Flexion 55    Extension 40    Right lateral flexion 30 35 40  Left lateral flexion 35 40 45  Right rotation 50 60 65  Left rotation 40 65 65   (Blank rows = not tested) TMJ: opening limited by [id number], clicking with opening on the Rt.  Palpable tenderness over Rt masseter and at TMJ UPPER EXTREMITY ROM: UE A/ROM is limited by 20% into flexion and abduction with pain at end range.  Hip flexibility is limited by 25% in all directions with pain in all directions.  UPPER EXTREMITY MMT: UE: 4+/5, LE 4+/5 except hip flexors 4-/5  TODAY'S TREATMENT:  Date: 05/31/22  Trigger Point Dry-Needling  Treatment instructions: Expect mild to moderate muscle soreness. S/S of pneumothorax if dry needled over a lung field, and to seek immediate medical attention should they occur. Patient verbalized understanding of these instructions and education.  Patient Consent Given: Yes Education handout provided: Previously provided Muscles treated: bil thoracic multifidi T6-12 and lumbar L1-5 Treatment response/outcome: Utilized skilled palpation to identify trigger points.  During dry needling able to palpate muscle twitch and muscle elongation  Elongation and release to bil thoracic and lumbar  Skilled palpation and monitoring by PT during dry needling   05/17/22:Pt arrives for aquatic physical therapy. Treatment took place in 3.5-5.5 feet of water. Water temperature was 91 degrees F. Pt entered the pool via stairs reciprocally with mild use of rails. Pt requires buoyancy of water for support and to offload joints with strengthening exercises. Pt utilizes viscosity of the water required for strengthening. Seated water bench with 75% submersion Pt performed seated LE AROM exercises 20x in all planes,    Standing in 50%- 75% depth water pt performed water walking in all 4 directions 10x. VC for speed in order to generate appropriate current for resistance and/or UE movements. Water bells and UE single buoy wts used for push/pull with UE. 50% depth standing: knee ext Bil 15x2 with small noodle, single buoy UE wt push and hold with 6 lengths of high knee marching, hip 3 ways with ankle fins 20x required some UE for balance today, core/lat press 20x with double buoy wts, 8 min horseback bicycle on yellow noodle.  Date: 05/15/22 NuStep: Level 3 x 8 minutes for endurance and mobility-PT present to discuss progress. Ball roll outs forward and lateral x10 each Trigger Point Dry-Needling  Treatment instructions: Expect mild to moderate muscle soreness. S/S of pneumothorax if dry needled over a lung field, and to seek immediate medical attention should they occur. Patient verbalized understanding of these instructions and education.  Patient Consent Given: Yes Education handout provided: Previously provided Muscles treated: bil thoracic multifidi T6-12 and lumbar L1-5 Treatment response/outcome: Utilized skilled palpation to identify trigger points.  During dry needling able to palpate muscle twitch and muscle elongation  Elongation and release to bil thoracic and lumbar  Skilled palpation and monitoring by PT during dry needling    HOME EXERCISE PROGRAM: Access Code: [email or web address] Access Code: [email or web address] URL: [email or web address] Date: 04/20/2022 Prepared by: Claiborne Billings  Exercises - - Controlled Jaw Opening with Tongue on Roof of Mouth  - 3 x daily - 7 x weekly - 1 sets -  10 reps - Jaw Protraction  - 3 x daily - 7 x weekly - 1 sets - 10 reps ASSESSMENT:  CLINICAL IMPRESSION: Pt arrived with 7/10 Lt sided neck pain and was not able to exercise.  Pain began a few days ago and pt hasn't gotten relief.  Session focused on manual therapy to address muscle tension and pain Lt>Rt neck.  Pt with  good response to DN and was able to move her neck better after treatment today.  Pt will stretch and do supine meditation to address pain.  Patient will benefit from skilled PT to address the below impairments and improve overall function.   OBJECTIVE IMPAIRMENTS decreased activity tolerance, decreased endurance, difficulty walking, decreased strength, increased muscle spasms, impaired flexibility, improper body mechanics, postural dysfunction, and pain.   ACTIVITY LIMITATIONS carrying, lifting, sitting, standing, reach over head, hygiene/grooming, and locomotion level  PARTICIPATION LIMITATIONS: meal prep, cleaning, laundry, driving, community activity, and yard work  PERSONAL FACTORS Past/current experiences, Time since onset of injury/illness/exacerbation, and 3+ comorbidities: fibromyalgia depression, anxiety,   are also affecting patient's functional outcome.   REHAB POTENTIAL: Good  CLINICAL DECISION MAKING: Evolving/moderate complexity  EVALUATION COMPLEXITY: Moderate   GOALS: Goals reviewed with patient? Yes  SHORT TERM GOALS: Target date: 03/20/22  Return to regular performance of HEP 2-3x/day to improve mobility  Baseline: stretching 2x/day (02/28/22) Goal status: In progress  2.  Report > or = to 25% reduction in cervical and lumbar pain with ADLs and self-care  Baseline: 20% better overall (02/28/2022) Goal status: in progress   3.  Reduce fibromyalgia impact score to < or = to 39  Baseline: 28 (02/28/22) Goal status: MET    LONG TERM GOALS: Target date: 06/16/22  Be independent in advanced HEP Baseline: gentle exercise  Goal status: In progress   2.  Reduce fibromyalgia impact questionnaire to < or = to 21 Baseline: 28 (02/28/22) Goal status: Revised   3.  Lift and carry laundry and her grandchildren with > or = to 50% reduction in pain Baseline: 20% better (02/28/22) Goal status: In progress   4.  Report > or = to 60% reduction in neck and lumbar pain with ADLs and  self-care  Baseline: 30-45% better (04/20/22) Goal status: revised  5.  Return to yardwork verbalize appropriate rest breaks and mechanics  Baseline: hasn't been doing due to being sick (02/28/22) Goal status: In progress    6. Report > or = to 30% reduction in Rt jaw pain with chewing and talking   Baseline 6/10   Goal Status: NEW PLAN: PT FREQUENCY: 2x/week  PT DURATION: 8 weeks  PLANNED INTERVENTIONS: Therapeutic exercises, Therapeutic activity, Neuromuscular re-education, Balance training, Gait training, Patient/Family education, Self Care, Joint mobilization, Aquatic Therapy, Dry Needling, Spinal manipulation, Spinal mobilization, Cryotherapy, Moist heat, Taping, Traction, Manual therapy, and Re-evaluation  PLAN FOR NEXT SESSION:  manual as needed, manage exercises as needed. Encourage gentle mobility. Aquatics to manage. chronic pain.    Sigurd Sos, PT 05/31/22 11:01 AM   [facility] [address] [place], [place] [address] Phone # [phone] Fax [phone]

## 2022-06-01 ENCOUNTER — Telehealth: Payer: Self-pay | Admitting: Psychiatry

## 2022-06-01 NOTE — Telephone Encounter (Signed)
Patient lm requesting a refill on the Vyvanse 40 mg. She stated a discussion was had to possibly increase to 50 mg. If ok'd by Dr Clovis Pu she would like the refill for 50 mg.  Appointment 07/27/22 Contact # [phone]

## 2022-06-02 ENCOUNTER — Other Ambulatory Visit: Payer: Self-pay

## 2022-06-02 ENCOUNTER — Ambulatory Visit: Payer: Medicare Other | Admitting: Psychology

## 2022-06-02 MED ORDER — LISDEXAMFETAMINE DIMESYLATE 50 MG PO CAPS: 50.0000 mg | ORAL_CAPSULE | Freq: Every day | ORAL | 0 refills | Status: DC

## 2022-06-02 NOTE — Telephone Encounter (Signed)
Pended.

## 2022-06-04 ENCOUNTER — Telehealth: Payer: Self-pay

## 2022-06-04 NOTE — Telephone Encounter (Signed)
Prior Authorization Lisdexamfetamine 30/30 [employer]

## 2022-06-05 ENCOUNTER — Ambulatory Visit: Payer: Medicare Other

## 2022-06-05 DIAGNOSIS — R293 Abnormal posture: Secondary | ICD-10-CM | POA: Diagnosis not present

## 2022-06-05 DIAGNOSIS — M542 Cervicalgia: Secondary | ICD-10-CM

## 2022-06-05 DIAGNOSIS — R252 Cramp and spasm: Secondary | ICD-10-CM | POA: Diagnosis not present

## 2022-06-05 DIAGNOSIS — M353 Polymyalgia rheumatica: Secondary | ICD-10-CM | POA: Diagnosis not present

## 2022-06-05 DIAGNOSIS — M6281 Muscle weakness (generalized): Secondary | ICD-10-CM

## 2022-06-05 NOTE — Telephone Encounter (Signed)
[employer] approved LIS DEXAMFETAMINE/VYVANSE 50mg   from 06/04/22-until further notice. Can fill #90 day supply [id number]

## 2022-06-05 NOTE — Therapy (Addendum)
OUTPATIENT PHYSICAL THERAPY TREATMENT   Patient Name: Danielle Bruce MRN: [id number] DOB:[birth year], 62 y.o., female Today's Date: 06/05/2022        PT End of Session - 06/05/22 1147     Visit Number 37    Date for PT Re-Evaluation 06/16/22    Authorization Type Medicare B- KX now    Progress Note Due on Visit 39    PT Start Time 1102    PT Stop Time 1145    PT Time Calculation (min) 43 min    Activity Tolerance Patient tolerated treatment well    Behavior During Therapy [facility] for tasks assessed/performed                                        Past Medical History:  Diagnosis Date   Abdominal pain, unspecified site 10/18/2012   Anemia    Anxiety    Arthritis    osteoarthritis   ASCUS (atypical squamous cells of undetermined significance) on Pap smear 05/06/2005   NEG HIGH RISK HPV--C&B BIOPSY BENIGN 12/2005   Asthma    Bipolar 2 disorder    Cancer    skin cancer - basal cell   Colon polyps    Complication of anesthesia    anxious afterwards, will get headaches    Constipation    Depression    Fibromyalgia 10/2013   GERD (gastroesophageal reflux disease)    Hearing loss on left    Heart murmur    never had any problems   Hemorrhoids    High cholesterol    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   cytology negative   IBS (irritable bowel syndrome)    Insomnia    Lymphocytic colitis    Lymphoma    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   Bone marrow biopsy showes 8% plasma cells IgA Lambda   Migraines    Nausea alone 10/18/2012   Osteoarthritis    Osteopenia    Peripheral neuropathy    PTSD (post-traumatic stress disorder)    Past Surgical History:  Procedure Laterality Date   BONE MARROW BIOPSY Left 12/18/2013   Plasma cell dyscrasia 8% population of plasma cells   BREAST BIOPSY Right    benign stereo   CESAREAN SECTION  41,03,01   CHOLECYSTECTOMY N/A 10/16/2012   Procedure: LAPAROSCOPIC CHOLECYSTECTOMY;   Surgeon: Clovis Pu. Cornett, MD;  Location: [facility];  Service: General;  Laterality: N/A;   COLONOSCOPY     numerous times   DILATION AND CURETTAGE OF UTERUS     ESOPHAGOGASTRODUODENOSCOPY     HEMORRHOID SURGERY  1993   x3   IUD REMOVAL  02/2015   Mirena   LAPAROSCOPIC LYSIS OF ADHESIONS N/A 10/16/2012   Procedure: LAPAROSCOPIC LYSIS OF ADHESIONS;  Surgeon: Maisie Fus A. Cornett, MD;  Location: [facility];  Service: General;  Laterality: N/A;   LAPAROSCOPY N/A 10/16/2012   Procedure: LAPAROSCOPY DIAGNOSTIC;  Surgeon: Clovis Pu. Cornett, MD;  Location: [facility];  Service: General;  Laterality: N/A;   PELVIC LAPAROSCOPY     RADIOLOGY WITH ANESTHESIA N/A 12/16/2015   Procedure: MRI OF BRAIN WITH AND WITHOUT CONTRAST;  Surgeon: Medication Radiologist, MD;  Location: [facility];  Service: Radiology;  Laterality: N/A;   SHOULDER SURGERY  2007/2008   SPINE SURGERY  2010   cervical   Patient Active Problem List   Diagnosis Date Noted   GAD (generalized anxiety  disorder) 12/26/2017   OCD (obsessive compulsive disorder) 12/26/2017   PTSD (post-traumatic stress disorder) 12/26/2017   DDD (degenerative disc disease), cervical 07/14/2016   Primary osteoarthritis of both feet 07/14/2016   Primary osteoarthritis of both hands 07/14/2016   Other fatigue 07/14/2016   History of IBS 07/14/2016   Osteopenia of multiple sites 07/14/2016   Fibromyalgia 01/13/2016   MGUS (monoclonal gammopathy of unknown significance) 12/23/2013   Chronic cholecystitis without calculus 10/18/2012   Abdominal pain, unspecified site 10/18/2012   Nausea alone 10/18/2012   Unspecified constipation 10/18/2012   Depression 10/18/2012   Anxiety    ASCUS (atypical squamous cells of undetermined significance) on Pap smear    IUD    Hemorrhoids 11/29/2010   Abdominal pain, left upper quadrant 11/29/2010    PCP:  Lupita Raider, MD  REFERRING PROVIDER: Lupita Raider, MD  REFERRING DIAG: fibromyalgia, TMJ dysfunction (added  04/20/22)  THERAPY DIAG:  Abnormal posture  Cramp and spasm  Muscle weakness (generalized)  Polymyalgia rheumatica  Cervicalgia  Rationale for Evaluation and Treatment Rehabilitation  ONSET DATE: chronic pain (fibromyalgia) with flare-up 2 months ago  SUBJECTIVE:                                                                                                                                                                                                         SUBJECTIVE STATEMENT: My neck felt much better after DN last session.  I would like to exercise today. I was in bed for a few days after I had a fight with my daughter.  I am feeling better, but still fragile.  I will see my counselor this week.    PERTINENT HISTORY:  Anxiety, bipolar disorder, depression, fibromyalgia, IBS, migraines, osteopenia, PTSD  PAIN:  Are you having pain?No 3/10 Pain location: Lt neck TMJ 0/10- chewing sometimes just hurts  Pain description: sore, tight  Aggravating factors: movement Relieving factors: muscle relaxers, rest, stretching  PRECAUTIONS: Other: chronic pain syndrome and depression Other: slow progression with exercise due to chronic condition  WEIGHT BEARING RESTRICTIONS No  FALLS:  Has patient fallen in last 6 months? No  LIVING ENVIRONMENT: Lives with: lives with their family Lives in: House/apartment  OCCUPATION: on disability  PLOF: Independent with basic ADLs and Leisure: yardwork, walking  Pt cares for her 4 young grandchildren- difficulty with care including lifting and carrying  PATIENT GOALS be more active with less pain, exercise at the gym regularly, lift and carry grandchildren and laundry, sleep with fewer interruptions, get back to more gardening.  OBJECTIVE:   DIAGNOSTIC  FINDINGS: none recent   PATIENT SURVEYS: Eval: Fibromyalgia Impact Questionnaire: first 2 sections only: 49 12/26/21: 40  02/28/22: 28 (first 2 sections only)  COGNITION: Overall  cognitive status: Within functional limits for tasks assessed  SENSATION: WFL  POSTURE: rounded shoulders and forward head  PALPATION: Diffuse palpable tenderness over bil neck, upper traps, thoracic, lumbar and gluteals with trigger points.    CERVICAL ROM:   Active ROM A/PROM (deg) eval A/ROM 12/26/21 A/ROM 04/20/22  Flexion 55    Extension 40    Right lateral flexion 30 35 40  Left lateral flexion 35 40 45  Right rotation 50 60 65  Left rotation 40 65 65   (Blank rows = not tested) TMJ: opening limited by 25%, clicking with opening on the Rt.  Palpable tenderness over Rt masseter and at TMJ UPPER EXTREMITY ROM: UE A/ROM is limited by 20% into flexion and abduction with pain at end range.  Hip flexibility is limited by 25% in all directions with pain in all directions.  UPPER EXTREMITY MMT: UE: 4+/5, LE 4+/5 except hip flexors 4-/5  TODAY'S TREATMENT:  Date: 06/05/22 NuStep: Level 3 x 8 minutes for endurance and mobility-PT present to discuss progress. Ball roll outs forward and lateral x10 each Low trunk rotation: 4x20 seconds  Ball squeeze 5" hold with TA contraction x10  Date: 05/31/22  Trigger Point Dry-Needling  Treatment instructions: Expect mild to moderate muscle soreness. S/S of pneumothorax if dry needled over a lung field, and to seek immediate medical attention should they occur. Patient verbalized understanding of these instructions and education.  Patient Consent Given: Yes Education handout provided: Previously provided Muscles treated: bil thoracic multifidi T6-12 and lumbar L1-5 Treatment response/outcome: Utilized skilled palpation to identify trigger points.  During dry needling able to palpate muscle twitch and muscle elongation  Elongation and release to bil thoracic and lumbar  Skilled palpation and monitoring by PT during dry needling   05/17/22:Pt arrives for aquatic physical therapy. Treatment took place in 3.5-5.5 feet of water. Water  temperature was 91 degrees F. Pt entered the pool via stairs reciprocally with mild use of rails. Pt requires buoyancy of water for support and to offload joints with strengthening exercises. Pt utilizes viscosity of the water required for strengthening. Seated water bench with 75% submersion Pt performed seated LE AROM exercises 20x in all planes,   Standing in 50%- 75% depth water pt performed water walking in all 4 directions 10x. VC for speed in order to generate appropriate current for resistance and/or UE movements. Water bells and UE single buoy wts used for push/pull with UE. 50% depth standing: knee ext Bil 15x2 with small noodle, single buoy UE wt push and hold with 6 lengths of high knee marching, hip 3 ways with ankle fins 20x required some UE for balance today, core/lat press 20x with double buoy wts, 8 min horseback bicycle on yellow noodle.  HOME EXERCISE PROGRAM: Access Code: [email or web address] Access Code: [email or web address] URL: [email or web address] Date: 04/20/2022 Prepared by: Tresa Endo  Exercises - - Controlled Jaw Opening with Tongue on Roof of Mouth  - 3 x daily - 7 x weekly - 1 sets - 10 reps - Jaw Protraction  - 3 x daily - 7 x weekly - 1 sets - 10 reps ASSESSMENT:  CLINICAL IMPRESSION: Pt arrived feeling much better today regarding pain.  She was able to participate in exercise in the clinic today.  PT monitored for pain throughout session.   Pt will stretch and  do supine meditation to address pain.  Patient will benefit from skilled PT to address the below impairments and improve overall function.   OBJECTIVE IMPAIRMENTS decreased activity tolerance, decreased endurance, difficulty walking, decreased strength, increased muscle spasms, impaired flexibility, improper body mechanics, postural dysfunction, and pain.   ACTIVITY LIMITATIONS carrying, lifting, sitting, standing, reach over head, hygiene/grooming, and locomotion level  PARTICIPATION LIMITATIONS: meal prep, cleaning,  laundry, driving, community activity, and yard work  PERSONAL FACTORS Past/current experiences, Time since onset of injury/illness/exacerbation, and 3+ comorbidities: fibromyalgia depression, anxiety,   are also affecting patient's functional outcome.   REHAB POTENTIAL: Good  CLINICAL DECISION MAKING: Evolving/moderate complexity  EVALUATION COMPLEXITY: Moderate   GOALS: Goals reviewed with patient? Yes  SHORT TERM GOALS: Target date: 03/20/22  Return to regular performance of HEP 2-3x/day to improve mobility  Baseline: stretching 2x/day (02/28/22) Goal status: In progress  2.  Report > or = to 25% reduction in cervical and lumbar pain with ADLs and self-care  Baseline: 20% better overall (02/28/2022) Goal status: in progress   3.  Reduce fibromyalgia impact score to < or = to 39  Baseline: 28 (02/28/22) Goal status: MET    LONG TERM GOALS: Target date: 06/16/22  Be independent in advanced HEP Baseline: gentle exercise  Goal status: In progress   2.  Reduce fibromyalgia impact questionnaire to < or = to 21 Baseline: 28 (02/28/22) Goal status: Revised   3.  Lift and carry laundry and her grandchildren with > or = to 50% reduction in pain Baseline: 20% better (02/28/22) Goal status: In progress   4.  Report > or = to 60% reduction in neck and lumbar pain with ADLs and self-care  Baseline: 30-45% better (04/20/22) Goal status: revised  5.  Return to yardwork verbalize appropriate rest breaks and mechanics  Baseline: hasn't been doing due to being sick (02/28/22) Goal status: In progress    6. Report > or = to 30% reduction in Rt jaw pain with chewing and talking   Baseline 6/10   Goal Status: NEW PLAN: PT FREQUENCY: 2x/week  PT DURATION: 8 weeks  PLANNED INTERVENTIONS: Therapeutic exercises, Therapeutic activity, Neuromuscular re-education, Balance training, Gait training, Patient/Family education, Self Care, Joint mobilization, Aquatic Therapy, Dry Needling, Spinal  manipulation, Spinal mobilization, Cryotherapy, Moist heat, Taping, Traction, Manual therapy, and Re-evaluation  PLAN FOR NEXT SESSION:  manual as needed, manage exercises as needed. Encourage gentle mobility. Aquatics to manage. chronic pain.    Danielle Bruce, PT 06/05/22 11:48 AM   [facility] [address], [address] [place], [place] [address] Phone # [phone] Fax [phone]

## 2022-06-07 ENCOUNTER — Ambulatory Visit (INDEPENDENT_AMBULATORY_CARE_PROVIDER_SITE_OTHER): Payer: Medicare Other | Admitting: Psychology

## 2022-06-07 DIAGNOSIS — F4310 Post-traumatic stress disorder, unspecified: Secondary | ICD-10-CM | POA: Diagnosis not present

## 2022-06-07 DIAGNOSIS — F902 Attention-deficit hyperactivity disorder, combined type: Secondary | ICD-10-CM

## 2022-06-07 DIAGNOSIS — F411 Generalized anxiety disorder: Secondary | ICD-10-CM | POA: Diagnosis not present

## 2022-06-07 DIAGNOSIS — F331 Major depressive disorder, recurrent, moderate: Secondary | ICD-10-CM | POA: Diagnosis not present

## 2022-06-07 NOTE — Progress Notes (Signed)
[facility] Counselor/Therapist Progress Note  Patient ID: CRISOL MELIAN, MRN: [id number],    Date: 06/07/2022  Time Spent: 60 minutes  Treatment Type: Individual Therapy  Reported Symptoms: flashbacks, responding to triggers, depression, anxiety, being busy all of the time, dissociation  Mental Status Exam: Appearance:  Casual     Behavior: Appropriate  Motor: Restlestness  Speech/Language:  Clear and Coherent  Affect: Blunt  Mood: anxious  Thought process: normal  Thought content:   WNL  Sensory/Perceptual disturbances:   WNL  Orientation: oriented to person, place, time/date, and situation  Attention: Good  Concentration: Good  Memory: WNL  Fund of knowledge:  Good  Insight:   Good  Judgment:  Fair  Impulse Control: Good   Risk Assessment: Danger to Self:  No Self-injurious Behavior: No Danger to Others: No Duty to Warn:no Physical Aggression / Violence:No  Access to Firearms a concern: No  Gang Involvement:No   Subjective: The patient attended a face-to-face individual therapy session via video visit today.  The patient gave verbal consent for the session to be on caregility.  The patient was in her home alone and the therapist was in the office.  The patient reports that she and her daughter had a big argument last week and that she has been having difficulty coping with what happened.  She explained what happened and it seems like she and her daughter were feeling very stressed about what was going on and they had a misunderstanding.  The patient told me about everything that happened and I validated for her that she was probably feeling stressed about what was going on with her daughter.  We also talked about some of her all or nothing thinking and that that also got her into difficulty with coping.  The patient seemed to have worked it out some with her daughter now and we talked about how to move forward from this. Interventions: Cognitive  Behavioral Therapy, Mindfulness Meditation, Eye Movement Desensitization and Reprocessing (EMDR), Insight-Oriented, and Interpersonal  Diagnosis:PTSD (post-traumatic stress disorder)  Generalized anxiety disorder  Major depressive disorder, recurrent episode, moderate  Attention deficit hyperactivity disorder (ADHD), combined type  Plan: Plan of Care: Client Abilities/Strengths  Insightful, motivated, Supportive family Client Treatment Preferences  Outpatient Individual therapy/EMDR  Client Statement of Needs  "I think I have PTSD and I feel like I need another kind of therapy than talk therapy" Treatment Level  Outpatient Individual therapy  Symptoms  Demonstrates an exaggerated startle response.:  (Status: maintained). Depressed  or irritable mood.: (Status:maintained). Describes a reliving of the event,  particularly through dissociative flashbacks.: (Status: maintained). Displays a  significant decline in interest and engagement in activities.:  (Status: maintained). Displays significant psychological and/or physiological distress resulting from internal and external  clues that are reminiscent of the traumatic event.: (Status: maintained).  Experiences disturbances in sleep.:  (Status: maintained). Experiences disturbing  and persistent thoughts, images, and/or perceptions of the traumatic event.:  (Status: maintained). Experiences frequent nightmares.: (Status: maintained).  Feelings of hopelessness, worthlessness, or inappropriate guilt.: No Description Entered (Status:  improved). Has been exposed to a traumatic event involving actual or perceived threat of death or  serious injury.:  (Status: maintained). Impairment in social, occupational, or  other areas of functioning.:(Status: maintained). Intentionally avoids activities,  places, people, or objects (e.g., up-armored vehicles) that evoke memories of the event.: (Status: maintained). Intentionally avoids thoughts,  feelings, or discussions related  to the traumatic event.: (Status: maintained). Reports difficulty concentrating as  well as feelings of guilt (Status:maintained). Reports response of intense fear,  helplessness, or horror to the traumatic event.:  (Status: maintained).  Problems Addressed  Unipolar Depression, Posttraumatic Stress Disorder (PTSD), Posttraumatic Stress Disorder (PTSD),   Posttraumatic Stress Disorder (PTSD), Posttraumatic Stress Disorder (PTSD)  Goals 1. Develop healthy thinking patterns and beliefs about self, others, and the world that lead to the alleviation and help prevent the relapse of  depression. Objective Identify and replace thoughts and beliefs that support depression. Target Date: 2023/01/04 Frequency: Weekly Progress: 0 Modality: individual Related Interventions 1. Explore and restructure underlying assumptions and beliefs reflected in biased self-talk that  may put the client at risk for relapse or recurrence. 2. Conduct Cognitive-Behavioral Therapy (see Cognitive Behavior Therapy by Reola Calkins; Overcoming Depression by Agapito Games al.), beginning with helping the client learn the connection among  cognition, depressive feelings, and actions. 2. Eliminate or reduce the negative impact trauma related symptoms have  on social, occupational, and family functioning. Objective Learn and implement personal skills to manage challenging situations related to trauma. Target Date: 2023/01/04 Frequency: Weekly Progress: 0 Modality: individual 3. No longer avoids persons, places, activities, and objects that are  reminiscent of the traumatic event. Objective Participate in Eye Movement Desensitization and Reprocessing (EMDR) to reduce emotional distress  related to traumatic thoughts, feelings, and images. Target Date: 2023/01/04 Frequency: Weekly Progress: 0 Modality: individual   Related Interventions 1. Utilize Eye Movement Desensitization and Reprocessing  (EMDR) to reduce the client's  emotional reactivity to the traumatic event and reduce PTSD symptoms. Objective Learn and implement guided self-dialogue to manage thoughts, feelings, and urges brought on by  encounters with trauma-related situations. Target Date: 2024-11/07 Frequency: Weekly Progress: 0 Modality: individual Related Interventions 1. Teach the client a guided self-dialogue procedure in which he/she learns to recognize  maladaptive self-talk, challenges its biases, copes with engendered feelings, overcomes  avoidance, and reinforces his/her accomplishments; review and reinforce progress, problemsolve obstacles. 4. No longer experiences intrusive event recollections, avoidance of event  reminders, intense arousal, or disinterest in activities or  relationships. 5. Thinks about or openly discusses the traumatic event with others  without experiencing psychological or physiological distress. Diagnosis Axis  none F43.10 (Posttraumatic stress disorder) - Open - [Signifier: n/a] Posttraumatic Stress  Disorder  Conditions For Discharge Achievement of treatment goals and objectives   Mitsugi Schrader G Aunesty Tyson, LCSW

## 2022-06-08 ENCOUNTER — Inpatient Hospital Stay: Admission: RE | Admit: 2022-06-08 | Payer: Medicare Other | Source: Ambulatory Visit

## 2022-06-12 ENCOUNTER — Ambulatory Visit: Payer: Medicare Other

## 2022-06-12 DIAGNOSIS — R293 Abnormal posture: Secondary | ICD-10-CM | POA: Diagnosis not present

## 2022-06-12 DIAGNOSIS — M6281 Muscle weakness (generalized): Secondary | ICD-10-CM | POA: Diagnosis not present

## 2022-06-12 DIAGNOSIS — M353 Polymyalgia rheumatica: Secondary | ICD-10-CM

## 2022-06-12 DIAGNOSIS — R252 Cramp and spasm: Secondary | ICD-10-CM

## 2022-06-12 DIAGNOSIS — M542 Cervicalgia: Secondary | ICD-10-CM | POA: Diagnosis not present

## 2022-06-12 NOTE — Therapy (Signed)
OUTPATIENT PHYSICAL THERAPY TREATMENT   Patient Name: Danielle Bruce MRN: [id number] DOB:07/30/1960, 62 y.o., female Today's Date: 06/12/2022    Progress Note Reporting Period 04/20/22 to 06/12/22  See note below for Objective Data and Assessment of Progress/Goals.        PT End of Session - 06/12/22 1202     Visit Number 38    Date for PT Re-Evaluation 08/11/22    Authorization Type Medicare B- KX now    Progress Note Due on Visit 48    PT Start Time 1101    PT Stop Time 1147    PT Time Calculation (min) 46 min    Activity Tolerance Patient tolerated treatment well    Behavior During Therapy [facility] for tasks assessed/performed                                         Past Medical History:  Diagnosis Date   Abdominal pain, unspecified site 10/18/2012   Anemia    Anxiety    Arthritis    osteoarthritis   ASCUS (atypical squamous cells of undetermined significance) on Pap smear 05/06/2005   NEG HIGH RISK HPV--C&B BIOPSY BENIGN 12/2005   Asthma    Bipolar 2 disorder    Cancer    skin cancer - basal cell   Colon polyps    Complication of anesthesia    anxious afterwards, will get headaches    Constipation    Depression    Fibromyalgia 10/2013   GERD (gastroesophageal reflux disease)    Hearing loss on left    Heart murmur    never had any problems   Hemorrhoids    High cholesterol    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   cytology negative   IBS (irritable bowel syndrome)    Insomnia    Lymphocytic colitis    Lymphoma    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   Bone marrow biopsy showes 8% plasma cells IgA Lambda   Migraines    Nausea alone 10/18/2012   Osteoarthritis    Osteopenia    Peripheral neuropathy    PTSD (post-traumatic stress disorder)    Past Surgical History:  Procedure Laterality Date   BONE MARROW BIOPSY Left 12/18/2013   Plasma cell dyscrasia 8% population of plasma cells   BREAST BIOPSY  Right    benign stereo   CESAREAN SECTION  93,71,69   CHOLECYSTECTOMY N/A 10/16/2012   Procedure: LAPAROSCOPIC CHOLECYSTECTOMY;  Surgeon: Clovis Pu. Cornett, MD;  Location: [facility];  Service: General;  Laterality: N/A;   COLONOSCOPY     numerous times   DILATION AND CURETTAGE OF UTERUS     ESOPHAGOGASTRODUODENOSCOPY     HEMORRHOID SURGERY  1993   x3   IUD REMOVAL  02/2015   Mirena   LAPAROSCOPIC LYSIS OF ADHESIONS N/A 10/16/2012   Procedure: LAPAROSCOPIC LYSIS OF ADHESIONS;  Surgeon: Maisie Fus A. Cornett, MD;  Location: [facility];  Service: General;  Laterality: N/A;   LAPAROSCOPY N/A 10/16/2012   Procedure: LAPAROSCOPY DIAGNOSTIC;  Surgeon: Clovis Pu. Cornett, MD;  Location: [facility];  Service: General;  Laterality: N/A;   PELVIC LAPAROSCOPY     RADIOLOGY WITH ANESTHESIA N/A 12/16/2015   Procedure: MRI OF BRAIN WITH AND WITHOUT CONTRAST;  Surgeon: Medication Radiologist, MD;  Location: [facility];  Service: Radiology;  Laterality: N/A;   SHOULDER SURGERY  2007/2008   SPINE  SURGERY  2010   cervical   Patient Active Problem List   Diagnosis Date Noted   GAD (generalized anxiety disorder) 12/26/2017   OCD (obsessive compulsive disorder) 12/26/2017   PTSD (post-traumatic stress disorder) 12/26/2017   DDD (degenerative disc disease), cervical 07/14/2016   Primary osteoarthritis of both feet 07/14/2016   Primary osteoarthritis of both hands 07/14/2016   Other fatigue 07/14/2016   History of IBS 07/14/2016   Osteopenia of multiple sites 07/14/2016   Fibromyalgia 01/13/2016   MGUS (monoclonal gammopathy of unknown significance) 12/23/2013   Chronic cholecystitis without calculus 10/18/2012   Abdominal pain, unspecified site 10/18/2012   Nausea alone 10/18/2012   Unspecified constipation 10/18/2012   Depression 10/18/2012   Anxiety    ASCUS (atypical squamous cells of undetermined significance) on Pap smear    IUD    Hemorrhoids 11/29/2010   Abdominal pain, left upper quadrant 11/29/2010    PCP:   Lupita Raider, MD  REFERRING PROVIDER: Lupita Raider, MD  REFERRING DIAG: fibromyalgia, TMJ dysfunction (added 04/20/22)  THERAPY DIAG:  Abnormal posture - Plan: PT plan of care cert/re-cert  Cramp and spasm - Plan: PT plan of care cert/re-cert  Muscle weakness (generalized) - Plan: PT plan of care cert/re-cert  Polymyalgia rheumatica - Plan: PT plan of care cert/re-cert  Rationale for Evaluation and Treatment Rehabilitation  ONSET DATE: chronic pain (fibromyalgia) with flare-up 2 months ago  SUBJECTIVE:                                                                                                                                                                                                         SUBJECTIVE STATEMENT: I am feeling much better today.  I have worked through some of my issues.  I am having Lt scapular pain.   PERTINENT HISTORY:  Anxiety, bipolar disorder, depression, fibromyalgia, IBS, migraines, osteopenia, PTSD  PAIN:  Are you having pain: Yes 3/10 Pain location: Lt scapula TMJ 0/10- chewing sometimes just hurts  Pain description: sore, tight  Aggravating factors: movement Relieving factors: muscle relaxers, rest, stretching  PRECAUTIONS: Other: chronic pain syndrome and depression Other: slow progression with exercise due to chronic condition  WEIGHT BEARING RESTRICTIONS No  FALLS:  Has patient fallen in last 6 months? No  LIVING ENVIRONMENT: Lives with: lives with their family Lives in: House/apartment  OCCUPATION: on disability  PLOF: Independent with basic ADLs and Leisure: yardwork, walking  Pt cares for her 4 young grandchildren- difficulty with care including lifting and carrying  PATIENT GOALS be more active with less pain, exercise at the  gym regularly, lift and carry grandchildren and laundry, sleep with fewer interruptions, get back to more gardening.  OBJECTIVE:   DIAGNOSTIC FINDINGS: none recent   PATIENT SURVEYS: Eval:  Fibromyalgia Impact Questionnaire: first 2 sections only: 49 12/26/21: 40  02/28/22: 28 (first 2 sections only)  COGNITION: Overall cognitive status: Within functional limits for tasks assessed  SENSATION: WFL  POSTURE: rounded shoulders and forward head  PALPATION: Diffuse palpable tenderness over bil neck, upper traps, thoracic, lumbar and gluteals with trigger points.    CERVICAL ROM:   Active ROM A/PROM (deg) eval A/ROM 12/26/21 A/ROM 04/20/22  Flexion 55    Extension 40    Right lateral flexion 30 35 40  Left lateral flexion 35 40 45  Right rotation 50 60 65  Left rotation 40 65 65   (Blank rows = not tested) TMJ: opening limited by 25%, clicking with opening on the Rt.  Palpable tenderness over Rt masseter and at TMJ UPPER EXTREMITY ROM: UE A/ROM is limited by 20% into flexion and abduction with pain at end range.  Hip flexibility is limited by 25% in all directions with pain in all directions.  UPPER EXTREMITY MMT: UE: 4+/5, LE 4+/5 except hip flexors 4-/5  TODAY'S TREATMENT:  Date: 06/12/22 NuStep: Level 3 x 8 minutes for endurance and mobility-PT present to discuss progress. Ball roll outs forward and lateral x10 each Seated on ball: 3 way raises with 1# weights x10 Trigger Point Dry-Needling  Treatment instructions: Expect mild to moderate muscle soreness. S/S of pneumothorax if dry needled over a lung field, and to seek immediate medical attention should they occur. Patient verbalized understanding of these instructions and education.  Patient Consent Given: Yes Education handout provided: Previously provided Muscles treated: Lt thoracic multifidi T6-9, Lt rhomboids and lats Treatment response/outcome: Utilized skilled palpation to identify trigger points.  During dry needling able to palpate muscle twitch and muscle elongation  Elongation and release to bil thoracic and lumbar  Skilled palpation and monitoring by PT during dry needling  Date: 06/05/22 NuStep:  Level 3 x 8 minutes for endurance and mobility-PT present to discuss progress. Ball roll outs forward and lateral x10 each Low trunk rotation: 4x20 seconds  Ball squeeze 5" hold with TA contraction x10  Date: 05/31/22  Trigger Point Dry-Needling  Treatment instructions: Expect mild to moderate muscle soreness. S/S of pneumothorax if dry needled over a lung field, and to seek immediate medical attention should they occur. Patient verbalized understanding of these instructions and education.  Patient Consent Given: Yes Education handout provided: Previously provided Muscles treated: bil thoracic multifidi T6-12 and lumbar L1-5 Treatment response/outcome: Utilized skilled palpation to identify trigger points.  During dry needling able to palpate muscle twitch and muscle elongation  Elongation and release to bil thoracic and lumbar  Skilled palpation and monitoring by PT during dry needling   05/17/22:Pt arrives for aquatic physical therapy. Treatment took place in 3.5-5.5 feet of water. Water temperature was 91 degrees F. Pt entered the pool via stairs reciprocally with mild use of rails. Pt requires buoyancy of water for support and to offload joints with strengthening exercises. Pt utilizes viscosity of the water required for strengthening. Seated water bench with 75% submersion Pt performed seated LE AROM exercises 20x in all planes,   Standing in 50%- 75% depth water pt performed water walking in all 4 directions 10x. VC for speed in order to generate appropriate current for resistance and/or UE movements. Water bells and UE single buoy wts  used for push/pull with UE. 50% depth standing: knee ext Bil 15x2 with small noodle, single buoy UE wt push and hold with 6 lengths of high knee marching, hip 3 ways with ankle fins 20x required some UE for balance today, core/lat press 20x with double buoy wts, 8 min horseback bicycle on yellow noodle.  HOME EXERCISE PROGRAM: Access Code: [email or web address] Access  Code: [email or web address] URL: [email or web address] Date: 04/20/2022 Prepared by: Tresa Endo  Exercises - - Controlled Jaw Opening with Tongue on Roof of Mouth  - 3 x daily - 7 x weekly - 1 sets - 10 reps - Jaw Protraction  - 3 x daily - 7 x weekly - 1 sets - 10 reps ASSESSMENT:  CLINICAL IMPRESSION: Pt has fluctuating levels of pain due to chronic nature of widespread pain disorder.  Jaw pain as resolved completely since we have started treating this. Pt has been in a flare-up recently so Fibromyalgia impact questionnaire is unchanged.  Pt was able to participate in gentle exercise today without  increased pain. Pt plans to try to do more yard work over the next few weeks and will take breaks as needed for pain. Pt benefits from aquatic therapy due to buoyancy and principles of water.  Patient will benefit from skilled PT to address the below impairments and improve overall function.   OBJECTIVE IMPAIRMENTS decreased activity tolerance, decreased endurance, difficulty walking, decreased strength, increased muscle spasms, impaired flexibility, improper body mechanics, postural dysfunction, and pain.   ACTIVITY LIMITATIONS carrying, lifting, sitting, standing, reach over head, hygiene/grooming, and locomotion level  PARTICIPATION LIMITATIONS: meal prep, cleaning, laundry, driving, community activity, and yard work  PERSONAL FACTORS Past/current experiences, Time since onset of injury/illness/exacerbation, and 3+ comorbidities: fibromyalgia depression, anxiety,   are also affecting patient's functional outcome.   REHAB POTENTIAL: Good  CLINICAL DECISION MAKING: Evolving/moderate complexity  EVALUATION COMPLEXITY: Moderate   GOALS: Goals reviewed with patient? Yes  SHORT TERM GOALS: Target date: 03/20/22  Return to regular performance of HEP 2-3x/day to improve mobility  Baseline: stretching 2x/day (02/28/22) Goal status: In progress  2.  Report > or = to 25% reduction in cervical and  lumbar pain with ADLs and self-care  Baseline: 20% better overall (02/28/2022) Goal status: in progress   3.  Reduce fibromyalgia impact score to < or = to 39  Baseline: 28 (02/28/22) Goal status: MET    LONG TERM GOALS: Target date: 08/11/22  Be independent in advanced HEP Baseline: gentle exercise and able to progress depending on pain level (06/12/22) Goal status: In progress   2.  Reduce fibromyalgia impact questionnaire to < or = to 21 Baseline: 28 (02/28/22) Goal status: Revised   3.  Lift and carry laundry and her grandchildren with > or = to 50% reduction in pain Baseline: shoulder and neck 30%, low back 40% Goal status: In progress   4.  Report > or = to 60% reduction in neck and lumbar pain with ADLs and self-care  Baseline: shoulder and neck 30%, low back 40% Goal status: in progress   5.  Return to yardwork verbalize appropriate rest breaks and mechanics  Baseline: has been able to do gentle things, will work to do more (06/12/22) Goal status: In progress    6. Report > or = to 30% reduction in Rt jaw pain with chewing and talking   Baseline  pain is resolved at this time (06/12/22)  Goal Status: MET PLAN: PT FREQUENCY: 2x/week  PT DURATION: 8 weeks  PLANNED INTERVENTIONS: Therapeutic  exercises, Therapeutic activity, Neuromuscular re-education, Balance training, Gait training, Patient/Family education, Self Care, Joint mobilization, Aquatic Therapy, Dry Needling, Spinal manipulation, Spinal mobilization, Cryotherapy, Moist heat, Taping, Traction, Manual therapy, and Re-evaluation  PLAN FOR NEXT SESSION:  manual as needed, manage exercises as needed. Encourage gentle mobility. Aquatics to manage chronic pain.    Lorrene Reid, PT 06/12/22 12:06 PM   [facility] [address], [address] [place], [place] [address] Phone # [phone] Fax [phone]

## 2022-06-13 ENCOUNTER — Ambulatory Visit (INDEPENDENT_AMBULATORY_CARE_PROVIDER_SITE_OTHER): Payer: Medicare Other | Admitting: Psychology

## 2022-06-13 DIAGNOSIS — F411 Generalized anxiety disorder: Secondary | ICD-10-CM | POA: Diagnosis not present

## 2022-06-13 DIAGNOSIS — F331 Major depressive disorder, recurrent, moderate: Secondary | ICD-10-CM

## 2022-06-13 DIAGNOSIS — F4310 Post-traumatic stress disorder, unspecified: Secondary | ICD-10-CM

## 2022-06-13 DIAGNOSIS — F902 Attention-deficit hyperactivity disorder, combined type: Secondary | ICD-10-CM | POA: Diagnosis not present

## 2022-06-13 NOTE — Progress Notes (Signed)
[facility] Counselor/Therapist Progress Note  Patient ID: EDESSA JAKUBOWICZ, MRN: [id number],    Date: 06/13/2022  Time Spent: 60 minutes  Treatment Type: Individual Therapy  Reported Symptoms: flashbacks, responding to triggers, depression, anxiety, being busy all of the time, dissociation  Mental Status Exam: Appearance:  Casual     Behavior: Appropriate  Motor: Restlestness  Speech/Language:  Clear and Coherent  Affect: Blunt  Mood: anxious  Thought process: normal  Thought content:   WNL  Sensory/Perceptual disturbances:   WNL  Orientation: oriented to person, place, time/date, and situation  Attention: Good  Concentration: Good  Memory: WNL  Fund of knowledge:  Good  Insight:   Good  Judgment:  Fair  Impulse Control: Good   Risk Assessment: Danger to Self:  No Self-injurious Behavior: No Danger to Others: No Duty to Warn:no Physical Aggression / Violence:No  Access to Firearms a concern: No  Gang Involvement:No   Subjective: The patient attended a face-to-face individual therapy session via video visit today.  The patient gave verbal consent for the session to be on caregility.  The patient was in her car alone and the therapist was in the office.  The patient presents as a little anxious today.  The patient was over helping her daughter take care of her 4 grandchildren.  We talked today about how she is handling things and apparently her daughter and she have worked things out since our last visit.  She did report that her daughter and her family are moving down the [place] and eventually all of them will move down that way.  We talked about how she feels about her grandchildren leaving her and she does feel a little sad about it because she loves spending time with them and then being so close, however she says that she feels like this is a good thing and is looking at it that way.  We talked more about what we want to work on when we decide to do the EMDR.   She has not been able to come into the office yet and I would like her to be in the office when we start the process.  We have a list of 8 or so things that we need to target with the trauma work.  I explained that we would work chronologically on these issues.  Interventions: Cognitive Behavioral Therapy, Mindfulness Meditation, Eye Movement Desensitization and Reprocessing (EMDR), Insight-Oriented, and Interpersonal  Diagnosis:PTSD (post-traumatic stress disorder)  Generalized anxiety disorder  Major depressive disorder, recurrent episode, moderate  Attention deficit hyperactivity disorder (ADHD), combined type  Plan: Plan of Care: Client Abilities/Strengths  Insightful, motivated, Supportive family Client Treatment Preferences  Outpatient Individual therapy/EMDR  Client Statement of Needs  "I think I have PTSD and I feel like I need another kind of therapy than talk therapy" Treatment Level  Outpatient Individual therapy  Symptoms  Demonstrates an exaggerated startle response.:  (Status: maintained). Depressed  or irritable mood.: (Status:maintained). Describes a reliving of the event,  particularly through dissociative flashbacks.: (Status: maintained). Displays a  significant decline in interest and engagement in activities.:  (Status: maintained). Displays significant psychological and/or physiological distress resulting from internal and external  clues that are reminiscent of the traumatic event.: (Status: maintained).  Experiences disturbances in sleep.:  (Status: maintained). Experiences disturbing  and persistent thoughts, images, and/or perceptions of the traumatic event.:  (Status: maintained). Experiences frequent nightmares.: (Status: maintained).  Feelings of hopelessness, worthlessness, or inappropriate guilt.: No Description Entered (Status:  improved).  Has been exposed to a traumatic event involving actual or perceived threat of death or  serious injury.:  (Status:  maintained). Impairment in social, occupational, or  other areas of functioning.:(Status: maintained). Intentionally avoids activities,  places, people, or objects (e.g., up-armored vehicles) that evoke memories of the event.: (Status: maintained). Intentionally avoids thoughts, feelings, or discussions related  to the traumatic event.: (Status: maintained). Reports difficulty concentrating as  well as feelings of guilt (Status:maintained). Reports response of intense fear,  helplessness, or horror to the traumatic event.:  (Status: maintained).  Problems Addressed  Unipolar Depression, Posttraumatic Stress Disorder (PTSD), Posttraumatic Stress Disorder (PTSD),   Posttraumatic Stress Disorder (PTSD), Posttraumatic Stress Disorder (PTSD)  Goals 1. Develop healthy thinking patterns and beliefs about self, others, and the world that lead to the alleviation and help prevent the relapse of  depression. Objective Identify and replace thoughts and beliefs that support depression. Target Date: 2023/01/04 Frequency: Weekly Progress: 0 Modality: individual Related Interventions 1. Explore and restructure underlying assumptions and beliefs reflected in biased self-talk that  may put the client at risk for relapse or recurrence. 2. Conduct Cognitive-Behavioral Therapy (see Cognitive Behavior Therapy by Reola Calkins; Overcoming Depression by Agapito Games al.), beginning with helping the client learn the connection among  cognition, depressive feelings, and actions. 2. Eliminate or reduce the negative impact trauma related symptoms have  on social, occupational, and family functioning. Objective Learn and implement personal skills to manage challenging situations related to trauma. Target Date: 2023/01/04 Frequency: Weekly Progress: 0 Modality: individual 3. No longer avoids persons, places, activities, and objects that are  reminiscent of the traumatic event. Objective Participate in Eye Movement  Desensitization and Reprocessing (EMDR) to reduce emotional distress  related to traumatic thoughts, feelings, and images. Target Date: 2023/01/04 Frequency: Weekly Progress: 0 Modality: individual   Related Interventions 1. Utilize Eye Movement Desensitization and Reprocessing (EMDR) to reduce the client's  emotional reactivity to the traumatic event and reduce PTSD symptoms. Objective Learn and implement guided self-dialogue to manage thoughts, feelings, and urges brought on by  encounters with trauma-related situations. Target Date: 2024-11/07 Frequency: Weekly Progress: 0 Modality: individual Related Interventions 1. Teach the client a guided self-dialogue procedure in which he/she learns to recognize  maladaptive self-talk, challenges its biases, copes with engendered feelings, overcomes  avoidance, and reinforces his/her accomplishments; review and reinforce progress, problemsolve obstacles. 4. No longer experiences intrusive event recollections, avoidance of event  reminders, intense arousal, or disinterest in activities or  relationships. 5. Thinks about or openly discusses the traumatic event with others  without experiencing psychological or physiological distress. Diagnosis Axis  none F43.10 (Posttraumatic stress disorder) - Open - [Signifier: n/a] Posttraumatic Stress  Disorder  Conditions For Discharge Achievement of treatment goals and objectives   Adanna Zuckerman G Haniyah Maciolek, LCSW

## 2022-06-13 NOTE — Therapy (Unsigned)
OUTPATIENT PHYSICAL THERAPY TREATMENT   Patient Name: Danielle Bruce MRN: [id number] DOB:March 14, 1960, 62 y.o., female Today's Date: 06/14/2022       PT End of Session - 06/14/22 2121     Visit Number 39    Date for PT Re-Evaluation 08/11/22    Authorization Type Medicare B- KX now    Progress Note Due on Visit 48    PT Start Time 0930    PT Stop Time 1015    PT Time Calculation (min) 45 min    Activity Tolerance Patient tolerated treatment well    Behavior During Therapy [facility] for tasks assessed/performed                                          Past Medical History:  Diagnosis Date   Abdominal pain, unspecified site 10/18/2012   Anemia    Anxiety    Arthritis    osteoarthritis   ASCUS (atypical squamous cells of undetermined significance) on Pap smear 05/06/2005   NEG HIGH RISK HPV--C&B BIOPSY BENIGN 12/2005   Asthma    Bipolar 2 disorder    Cancer    skin cancer - basal cell   Colon polyps    Complication of anesthesia    anxious afterwards, will get headaches    Constipation    Depression    Fibromyalgia 10/2013   GERD (gastroesophageal reflux disease)    Hearing loss on left    Heart murmur    never had any problems   Hemorrhoids    High cholesterol    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   cytology negative   IBS (irritable bowel syndrome)    Insomnia    Lymphocytic colitis    Lymphoma    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   Bone marrow biopsy showes 8% plasma cells IgA Lambda   Migraines    Nausea alone 10/18/2012   Osteoarthritis    Osteopenia    Peripheral neuropathy    PTSD (post-traumatic stress disorder)    Past Surgical History:  Procedure Laterality Date   BONE MARROW BIOPSY Left 12/18/2013   Plasma cell dyscrasia 8% population of plasma cells   BREAST BIOPSY Right    benign stereo   CESAREAN SECTION  70,48,88   CHOLECYSTECTOMY N/A 10/16/2012   Procedure: LAPAROSCOPIC CHOLECYSTECTOMY;   Surgeon: Clovis Pu. Cornett, MD;  Location: [facility];  Service: General;  Laterality: N/A;   COLONOSCOPY     numerous times   DILATION AND CURETTAGE OF UTERUS     ESOPHAGOGASTRODUODENOSCOPY     HEMORRHOID SURGERY  1993   x3   IUD REMOVAL  02/2015   Mirena   LAPAROSCOPIC LYSIS OF ADHESIONS N/A 10/16/2012   Procedure: LAPAROSCOPIC LYSIS OF ADHESIONS;  Surgeon: Maisie Fus A. Cornett, MD;  Location: [facility];  Service: General;  Laterality: N/A;   LAPAROSCOPY N/A 10/16/2012   Procedure: LAPAROSCOPY DIAGNOSTIC;  Surgeon: Clovis Pu. Cornett, MD;  Location: [facility];  Service: General;  Laterality: N/A;   PELVIC LAPAROSCOPY     RADIOLOGY WITH ANESTHESIA N/A 12/16/2015   Procedure: MRI OF BRAIN WITH AND WITHOUT CONTRAST;  Surgeon: Medication Radiologist, MD;  Location: [facility];  Service: Radiology;  Laterality: N/A;   SHOULDER SURGERY  2007/2008   SPINE SURGERY  2010   cervical   Patient Active Problem List   Diagnosis Date Noted   GAD (generalized  anxiety disorder) 12/26/2017   OCD (obsessive compulsive disorder) 12/26/2017   PTSD (post-traumatic stress disorder) 12/26/2017   DDD (degenerative disc disease), cervical 07/14/2016   Primary osteoarthritis of both feet 07/14/2016   Primary osteoarthritis of both hands 07/14/2016   Other fatigue 07/14/2016   History of IBS 07/14/2016   Osteopenia of multiple sites 07/14/2016   Fibromyalgia 01/13/2016   MGUS (monoclonal gammopathy of unknown significance) 12/23/2013   Chronic cholecystitis without calculus 10/18/2012   Abdominal pain, unspecified site 10/18/2012   Nausea alone 10/18/2012   Unspecified constipation 10/18/2012   Depression 10/18/2012   Anxiety    ASCUS (atypical squamous cells of undetermined significance) on Pap smear    IUD    Hemorrhoids 11/29/2010   Abdominal pain, left upper quadrant 11/29/2010    PCP:  Lupita Raider, MD  REFERRING PROVIDER: Lupita Raider, MD  REFERRING DIAG: fibromyalgia, TMJ dysfunction (added  04/20/22)  THERAPY DIAG:  Abnormal posture  Cramp and spasm  Muscle weakness (generalized)  Polymyalgia rheumatica  Rationale for Evaluation and Treatment Rehabilitation  ONSET DATE: chronic pain (fibromyalgia) with flare-up 2 months ago  SUBJECTIVE:                                                                                                                                                                                                         SUBJECTIVE STATEMENT: Feeling very good today. The needling abolished that scapula pain. I am very happy to exercise in the water today.   PERTINENT HISTORY:  Anxiety, bipolar disorder, depression, fibromyalgia, IBS, migraines, osteopenia, PTSD  PAIN:  Are you having pain: Yes 0/10 Pain location: TMJ 0/10- chewing sometimes just hurts  Pain description: sore, tight  Aggravating factors: movement Relieving factors: muscle relaxers, rest, stretching  PRECAUTIONS: Other: chronic pain syndrome and depression Other: slow progression with exercise due to chronic condition  WEIGHT BEARING RESTRICTIONS No  FALLS:  Has patient fallen in last 6 months? No  LIVING ENVIRONMENT: Lives with: lives with their family Lives in: House/apartment  OCCUPATION: on disability  PLOF: Independent with basic ADLs and Leisure: yardwork, walking  Pt cares for her 4 young grandchildren- difficulty with care including lifting and carrying  PATIENT GOALS be more active with less pain, exercise at the gym regularly, lift and carry grandchildren and laundry, sleep with fewer interruptions, get back to more gardening.  OBJECTIVE:   DIAGNOSTIC FINDINGS: none recent   PATIENT SURVEYS: Eval: Fibromyalgia Impact Questionnaire: first 2 sections only: 49 12/26/21: 40  02/28/22: 28 (first 2 sections only)  COGNITION: Overall cognitive status: Within  functional limits for tasks assessed  SENSATION: WFL  POSTURE: rounded shoulders and forward  head  PALPATION: Diffuse palpable tenderness over bil neck, upper traps, thoracic, lumbar and gluteals with trigger points.    CERVICAL ROM:   Active ROM A/PROM (deg) eval A/ROM 12/26/21 A/ROM 04/20/22  Flexion 55    Extension 40    Right lateral flexion 30 35 40  Left lateral flexion 35 40 45  Right rotation 50 60 65  Left rotation 40 65 65   (Blank rows = not tested) TMJ: opening limited by 25%, clicking with opening on the Rt.  Palpable tenderness over Rt masseter and at TMJ UPPER EXTREMITY ROM: UE A/ROM is limited by 20% into flexion and abduction with pain at end range.  Hip flexibility is limited by 25% in all directions with pain in all directions.  UPPER EXTREMITY MMT: UE: 4+/5, LE 4+/5 except hip flexors 4-/5  TODAY'S TREATMENT:   06/14/22:Pt arrives for aquatic physical therapy. Treatment took place in 3.5-5.5 feet of water. Water temperature was 90 degrees F. Pt entered the pool via stairs reciprocally with mild use of rails. Pt requires buoyancy of water for support and to offload joints with strengthening exercises. Pt utilizes viscosity of the water required for strengthening. Seated water bench with 75% submersion Pt performed seated LE AROM exercises 20x in all planes,   Standing in 50%- 75% depth water pt performed water walking in all 4 directions 10x with ankle fins added for walking duration. VC for speed in order to generate appropriate current for resistance and/or UE movements. UE single buoy wts used for push/pull with UE. 50% depth standing: knee ext Bil 15x2 with small noodle, single buoy UE wt push and hold with 6 lengths of high knee marching, hip 3 ways with ankle fins 20x required some UE for balance today, core/lat press 20x with double buoy wts, 8 min horseback bicycle on yellow noodle.   Date: 06/12/22 NuStep: Level 3 x 8 minutes for endurance and mobility-PT present to discuss progress. Ball roll outs forward and lateral x10 each Seated on ball: 3  way raises with 1# weights x10 Trigger Point Dry-Needling  Treatment instructions: Expect mild to moderate muscle soreness. S/S of pneumothorax if dry needled over a lung field, and to seek immediate medical attention should they occur. Patient verbalized understanding of these instructions and education.  Patient Consent Given: Yes Education handout provided: Previously provided Muscles treated: Lt thoracic multifidi T6-9, Lt rhomboids and lats Treatment response/outcome: Utilized skilled palpation to identify trigger points.  During dry needling able to palpate muscle twitch and muscle elongation  Elongation and release to bil thoracic and lumbar  Skilled palpation and monitoring by PT during dry needling  Date: 06/05/22 NuStep: Level 3 x 8 minutes for endurance and mobility-PT present to discuss progress. Ball roll outs forward and lateral x10 each Low trunk rotation: 4x20 seconds  Ball squeeze 5" hold with TA contraction x10  Date: 05/31/22  Trigger Point Dry-Needling  Treatment instructions: Expect mild to moderate muscle soreness. S/S of pneumothorax if dry needled over a lung field, and to seek immediate medical attention should they occur. Patient verbalized understanding of these instructions and education.  Patient Consent Given: Yes Education handout provided: Previously provided Muscles treated: bil thoracic multifidi T6-12 and lumbar L1-5 Treatment response/outcome: Utilized skilled palpation to identify trigger points.  During dry needling able to palpate muscle twitch and muscle elongation  Elongation and release to bil thoracic and lumbar  Skilled  palpation and monitoring by PT during dry needling   05/17/22:Pt arrives for aquatic physical therapy. Treatment took place in 3.5-5.5 feet of water. Water temperature was 91 degrees F. Pt entered the pool via stairs reciprocally with mild use of rails. Pt requires buoyancy of water for support and to offload joints with strengthening  exercises. Pt utilizes viscosity of the water required for strengthening. Seated water bench with 75% submersion Pt performed seated LE AROM exercises 20x in all planes,   Standing in 50%- 75% depth water pt performed water walking in all 4 directions 10x. VC for speed in order to generate appropriate current for resistance and/or UE movements. Water bells and UE single buoy wts used for push/pull with UE. 50% depth standing: knee ext Bil 15x2 with small noodle, single buoy UE wt push and hold with 6 lengths of high knee marching, hip 3 ways with ankle fins 20x required some UE for balance today, core/lat press 20x with double buoy wts, 8 min horseback bicycle on yellow noodle.  HOME EXERCISE PROGRAM: Access Code: [email or web address] Access Code: [email or web address] URL: [email or web address] Date: 04/20/2022 Prepared by: Tresa Endo  Exercises - - Controlled Jaw Opening with Tongue on Roof of Mouth  - 3 x daily - 7 x weekly - 1 sets - 10 reps - Jaw Protraction  - 3 x daily - 7 x weekly - 1 sets - 10 reps ASSESSMENT:  CLINICAL IMPRESSION: Pt returns to aquatics after having difficulty getting an appointment. Today she presents pain free and reports the needling abolished her scapula pain from the other day. Pt performed all her exercises excellent, even moving at a fast pace especially when walking.  OBJECTIVE IMPAIRMENTS decreased activity tolerance, decreased endurance, difficulty walking, decreased strength, increased muscle spasms, impaired flexibility, improper body mechanics, postural dysfunction, and pain.   ACTIVITY LIMITATIONS carrying, lifting, sitting, standing, reach over head, hygiene/grooming, and locomotion level  PARTICIPATION LIMITATIONS: meal prep, cleaning, laundry, driving, community activity, and yard work  PERSONAL FACTORS Past/current experiences, Time since onset of injury/illness/exacerbation, and 3+ comorbidities: fibromyalgia depression, anxiety,   are also affecting patient's  functional outcome.   REHAB POTENTIAL: Good  CLINICAL DECISION MAKING: Evolving/moderate complexity  EVALUATION COMPLEXITY: Moderate   GOALS: Goals reviewed with patient? Yes  SHORT TERM GOALS: Target date: 03/20/22  Return to regular performance of HEP 2-3x/day to improve mobility  Baseline: stretching 2x/day (02/28/22) Goal status: In progress  2.  Report > or = to 25% reduction in cervical and lumbar pain with ADLs and self-care  Baseline: 20% better overall (02/28/2022) Goal status: in progress   3.  Reduce fibromyalgia impact score to < or = to 39  Baseline: 28 (02/28/22) Goal status: MET    LONG TERM GOALS: Target date: 08/11/22  Be independent in advanced HEP Baseline: gentle exercise and able to progress depending on pain level (06/12/22) Goal status: In progress   2.  Reduce fibromyalgia impact questionnaire to < or = to 21 Baseline: 28 (02/28/22) Goal status: Revised   3.  Lift and carry laundry and her grandchildren with > or = to 50% reduction in pain Baseline: shoulder and neck 30%, low back 40% Goal status: In progress   4.  Report > or = to 60% reduction in neck and lumbar pain with ADLs and self-care  Baseline: shoulder and neck 30%, low back 40% Goal status: in progress   5.  Return to yardwork verbalize appropriate rest breaks and mechanics  Baseline: has been able to do gentle things,  will work to do more (06/12/22) Goal status: In progress    6. Report > or = to 30% reduction in Rt jaw pain with chewing and talking   Baseline  pain is resolved at this time (06/12/22)  Goal Status: MET PLAN: PT FREQUENCY: 2x/week  PT DURATION: 8 weeks  PLANNED INTERVENTIONS: Therapeutic exercises, Therapeutic activity, Neuromuscular re-education, Balance training, Gait training, Patient/Family education, Self Care, Joint mobilization, Aquatic Therapy, Dry Needling, Spinal manipulation, Spinal mobilization, Cryotherapy, Moist heat, Taping, Traction, Manual therapy, and  Re-evaluation  PLAN FOR NEXT SESSION:  manual as needed, manage exercises as needed. Encourage gentle mobility. Aquatics to manage chronic pain.    Ane Payment, PTA 06/14/22 9:29 PM   [facility] [address] [place], [place] [address] Phone # [phone] Fax [phone]

## 2022-06-14 ENCOUNTER — Encounter: Payer: Self-pay | Admitting: Physical Therapy

## 2022-06-14 ENCOUNTER — Ambulatory Visit: Payer: Medicare Other | Admitting: Physical Therapy

## 2022-06-14 DIAGNOSIS — M353 Polymyalgia rheumatica: Secondary | ICD-10-CM | POA: Diagnosis not present

## 2022-06-14 DIAGNOSIS — M542 Cervicalgia: Secondary | ICD-10-CM | POA: Diagnosis not present

## 2022-06-14 DIAGNOSIS — R252 Cramp and spasm: Secondary | ICD-10-CM

## 2022-06-14 DIAGNOSIS — R293 Abnormal posture: Secondary | ICD-10-CM

## 2022-06-14 DIAGNOSIS — M6281 Muscle weakness (generalized): Secondary | ICD-10-CM

## 2022-06-19 ENCOUNTER — Ambulatory Visit: Payer: Medicare Other

## 2022-06-19 DIAGNOSIS — R252 Cramp and spasm: Secondary | ICD-10-CM | POA: Diagnosis not present

## 2022-06-19 DIAGNOSIS — M353 Polymyalgia rheumatica: Secondary | ICD-10-CM

## 2022-06-19 DIAGNOSIS — M542 Cervicalgia: Secondary | ICD-10-CM | POA: Diagnosis not present

## 2022-06-19 DIAGNOSIS — R293 Abnormal posture: Secondary | ICD-10-CM | POA: Diagnosis not present

## 2022-06-19 DIAGNOSIS — M6281 Muscle weakness (generalized): Secondary | ICD-10-CM

## 2022-06-19 NOTE — Therapy (Signed)
OUTPATIENT PHYSICAL THERAPY TREATMENT   Patient Name: Danielle Bruce MRN: [id number] DOB:1960-12-07, 62 y.o., female Today's Date: 06/19/2022       PT End of Session - 06/19/22 1143     Visit Number 40    Date for PT Re-Evaluation 08/11/22    Authorization Type Medicare B- KX now    Progress Note Due on Visit 48    PT Start Time 1104    PT Stop Time 1144    PT Time Calculation (min) 40 min    Activity Tolerance Patient tolerated treatment well    Behavior During Therapy [facility] for tasks assessed/performed                                           Past Medical History:  Diagnosis Date   Abdominal pain, unspecified site 10/18/2012   Anemia    Anxiety    Arthritis    osteoarthritis   ASCUS (atypical squamous cells of undetermined significance) on Pap smear 05/06/2005   NEG HIGH RISK HPV--C&B BIOPSY BENIGN 12/2005   Asthma    Bipolar 2 disorder    Cancer    skin cancer - basal cell   Colon polyps    Complication of anesthesia    anxious afterwards, will get headaches    Constipation    Depression    Fibromyalgia 10/2013   GERD (gastroesophageal reflux disease)    Hearing loss on left    Heart murmur    never had any problems   Hemorrhoids    High cholesterol    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   cytology negative   IBS (irritable bowel syndrome)    Insomnia    Lymphocytic colitis    Lymphoma    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   Bone marrow biopsy showes 8% plasma cells IgA Lambda   Migraines    Nausea alone 10/18/2012   Osteoarthritis    Osteopenia    Peripheral neuropathy    PTSD (post-traumatic stress disorder)    Past Surgical History:  Procedure Laterality Date   BONE MARROW BIOPSY Left 12/18/2013   Plasma cell dyscrasia 8% population of plasma cells   BREAST BIOPSY Right    benign stereo   CESAREAN SECTION  06,30,16   CHOLECYSTECTOMY N/A 10/16/2012   Procedure: LAPAROSCOPIC  CHOLECYSTECTOMY;  Surgeon: Clovis Pu. Cornett, MD;  Location: [facility];  Service: General;  Laterality: N/A;   COLONOSCOPY     numerous times   DILATION AND CURETTAGE OF UTERUS     ESOPHAGOGASTRODUODENOSCOPY     HEMORRHOID SURGERY  1993   x3   IUD REMOVAL  02/2015   Mirena   LAPAROSCOPIC LYSIS OF ADHESIONS N/A 10/16/2012   Procedure: LAPAROSCOPIC LYSIS OF ADHESIONS;  Surgeon: Maisie Fus A. Cornett, MD;  Location: [facility];  Service: General;  Laterality: N/A;   LAPAROSCOPY N/A 10/16/2012   Procedure: LAPAROSCOPY DIAGNOSTIC;  Surgeon: Clovis Pu. Cornett, MD;  Location: [facility];  Service: General;  Laterality: N/A;   PELVIC LAPAROSCOPY     RADIOLOGY WITH ANESTHESIA N/A 12/16/2015   Procedure: MRI OF BRAIN WITH AND WITHOUT CONTRAST;  Surgeon: Medication Radiologist, MD;  Location: [facility];  Service: Radiology;  Laterality: N/A;   SHOULDER SURGERY  2007/2008   SPINE SURGERY  2010   cervical   Patient Active Problem List   Diagnosis Date Noted   GAD (  generalized anxiety disorder) 12/26/2017   OCD (obsessive compulsive disorder) 12/26/2017   PTSD (post-traumatic stress disorder) 12/26/2017   DDD (degenerative disc disease), cervical 07/14/2016   Primary osteoarthritis of both feet 07/14/2016   Primary osteoarthritis of both hands 07/14/2016   Other fatigue 07/14/2016   History of IBS 07/14/2016   Osteopenia of multiple sites 07/14/2016   Fibromyalgia 01/13/2016   MGUS (monoclonal gammopathy of unknown significance) 12/23/2013   Chronic cholecystitis without calculus 10/18/2012   Abdominal pain, unspecified site 10/18/2012   Nausea alone 10/18/2012   Unspecified constipation 10/18/2012   Depression 10/18/2012   Anxiety    ASCUS (atypical squamous cells of undetermined significance) on Pap smear    IUD    Hemorrhoids 11/29/2010   Abdominal pain, left upper quadrant 11/29/2010    PCP:  Lupita Raider, MD  REFERRING PROVIDER: Lupita Raider, MD  REFERRING DIAG: fibromyalgia, TMJ dysfunction  (added 04/20/22)  THERAPY DIAG:  Abnormal posture  Cramp and spasm  Muscle weakness (generalized)  Cervicalgia  Polymyalgia rheumatica  Rationale for Evaluation and Treatment Rehabilitation  ONSET DATE: chronic pain (fibromyalgia) with flare-up 2 months ago  SUBJECTIVE:                                                                                                                                                                                                         SUBJECTIVE STATEMENT: I am feeling good today.  I cleared space in my bedroom to be able to do exercise.   PERTINENT HISTORY:  Anxiety, bipolar disorder, depression, fibromyalgia, IBS, migraines, osteopenia, PTSD  PAIN:  Are you having pain: Yes 0/10 Pain location: TMJ 0/10- chewing sometimes just hurts  Pain description: sore, tight  Aggravating factors: movement Relieving factors: muscle relaxers, rest, stretching  PRECAUTIONS: Other: chronic pain syndrome and depression Other: slow progression with exercise due to chronic condition  WEIGHT BEARING RESTRICTIONS No  FALLS:  Has patient fallen in last 6 months? No  LIVING ENVIRONMENT: Lives with: lives with their family Lives in: House/apartment  OCCUPATION: on disability  PLOF: Independent with basic ADLs and Leisure: yardwork, walking  Pt cares for her 4 young grandchildren- difficulty with care including lifting and carrying  PATIENT GOALS be more active with less pain, exercise at the gym regularly, lift and carry grandchildren and laundry, sleep with fewer interruptions, get back to more gardening.  OBJECTIVE:   DIAGNOSTIC FINDINGS: none recent   PATIENT SURVEYS: Eval: Fibromyalgia Impact Questionnaire: first 2 sections only: 49 12/26/21: 40  02/28/22: 28 (first 2 sections only)  COGNITION: Overall cognitive status:  Within functional limits for tasks assessed  SENSATION: WFL  POSTURE: rounded shoulders and forward  head  PALPATION: Diffuse palpable tenderness over bil neck, upper traps, thoracic, lumbar and gluteals with trigger points.    CERVICAL ROM:   Active ROM A/PROM (deg) eval A/ROM 12/26/21 A/ROM 04/20/22  Flexion 55    Extension 40    Right lateral flexion 30 35 40  Left lateral flexion 35 40 45  Right rotation 50 60 65  Left rotation 40 65 65   (Blank rows = not tested) TMJ: opening limited by 25%, clicking with opening on the Rt.  Palpable tenderness over Rt masseter and at TMJ UPPER EXTREMITY ROM: UE A/ROM is limited by 20% into flexion and abduction with pain at end range.  Hip flexibility is limited by 25% in all directions with pain in all directions.  UPPER EXTREMITY MMT: UE: 4+/5, LE 4+/5 except hip flexors 4-/5  TODAY'S TREATMENT:  Date: 06/19/22 NuStep: Level 3 x 8 minutes for endurance and mobility-PT present to discuss progress. Ball roll outs forward and lateral x10 each Seated on ball: 3 way raises with 1# weights x10 Standing on balance pad: weight shifting 3 ways x 1 minute Sit to stand: 2x5 with core and glute engagement  Seated hamstring stretch : 3x20 seconds  Shoulder extension with red band 2x10  06/14/22:Pt arrives for aquatic physical therapy. Treatment took place in 3.5-5.5 feet of water. Water temperature was 90 degrees F. Pt entered the pool via stairs reciprocally with mild use of rails. Pt requires buoyancy of water for support and to offload joints with strengthening exercises. Pt utilizes viscosity of the water required for strengthening. Seated water bench with 75% submersion Pt performed seated LE AROM exercises 20x in all planes,   Standing in 50%- 75% depth water pt performed water walking in all 4 directions 10x with ankle fins added for walking duration. VC for speed in order to generate appropriate current for resistance and/or UE movements. UE single buoy wts used for push/pull with UE. 50% depth standing: knee ext Bil 15x2 with small noodle,  single buoy UE wt push and hold with 6 lengths of high knee marching, hip 3 ways with ankle fins 20x required some UE for balance today, core/lat press 20x with double buoy wts, 8 min horseback bicycle on yellow noodle.   Date: 06/12/22 NuStep: Level 3 x 8 minutes for endurance and mobility-PT present to discuss progress. Ball roll outs forward and lateral x10 each Seated on ball: 3 way raises with 1# weights x10 Trigger Point Dry-Needling  Treatment instructions: Expect mild to moderate muscle soreness. S/S of pneumothorax if dry needled over a lung field, and to seek immediate medical attention should they occur. Patient verbalized understanding of these instructions and education.  Patient Consent Given: Yes Education handout provided: Previously provided Muscles treated: Lt thoracic multifidi T6-9, Lt rhomboids and lats Treatment response/outcome: Utilized skilled palpation to identify trigger points.  During dry needling able to palpate muscle twitch and muscle elongation  Elongation and release to bil thoracic and lumbar  Skilled palpation and monitoring by PT during dry needling  Date: 06/05/22 NuStep: Level 3 x 8 minutes for endurance and mobility-PT present to discuss progress. Ball roll outs forward and lateral x10 each Low trunk rotation: 4x20 seconds  Ball squeeze 5" hold with TA contraction x10   HOME EXERCISE PROGRAM: Access Code: [email or web address] Access Code: [email or web address] URL: [email or web address] Date: 04/20/2022 Prepared by: Tresa Endo  Exercises - - Controlled Jaw Opening  with Tongue on Roof of Mouth  - 3 x daily - 7 x weekly - 1 sets - 10 reps - Jaw Protraction  - 3 x daily - 7 x weekly - 1 sets - 10 reps ASSESSMENT:  CLINICAL IMPRESSION: Pt arrived with minimal pain.  She had good response to DN last session and has not had much pain in the region since.  Pt was able to spend session exercising.  PT monitored for technique, pain and fatigue.  Pt has been able to  exercise more at home without limitation.  Pt with chronic pain condition and will benefit from continued land and aquatic based exercise to improve functional mobility and reduce pain.   OBJECTIVE IMPAIRMENTS decreased activity tolerance, decreased endurance, difficulty walking, decreased strength, increased muscle spasms, impaired flexibility, improper body mechanics, postural dysfunction, and pain.   ACTIVITY LIMITATIONS carrying, lifting, sitting, standing, reach over head, hygiene/grooming, and locomotion level  PARTICIPATION LIMITATIONS: meal prep, cleaning, laundry, driving, community activity, and yard work  PERSONAL FACTORS Past/current experiences, Time since onset of injury/illness/exacerbation, and 3+ comorbidities: fibromyalgia depression, anxiety,   are also affecting patient's functional outcome.   REHAB POTENTIAL: Good  CLINICAL DECISION MAKING: Evolving/moderate complexity  EVALUATION COMPLEXITY: Moderate   GOALS: Goals reviewed with patient? Yes  SHORT TERM GOALS: Target date: 03/20/22  Return to regular performance of HEP 2-3x/day to improve mobility  Baseline: stretching 2x/day (02/28/22) Goal status: In progress  2.  Report > or = to 25% reduction in cervical and lumbar pain with ADLs and self-care  Baseline: 20% better overall (02/28/2022) Goal status: in progress   3.  Reduce fibromyalgia impact score to < or = to 39  Baseline: 28 (02/28/22) Goal status: MET    LONG TERM GOALS: Target date: 08/11/22  Be independent in advanced HEP Baseline: gentle exercise and able to progress depending on pain level (06/12/22) Goal status: In progress   2.  Reduce fibromyalgia impact questionnaire to < or = to 21 Baseline: 28 (02/28/22) Goal status: Revised   3.  Lift and carry laundry and her grandchildren with > or = to 50% reduction in pain Baseline: shoulder and neck 30%, low back 40% Goal status: In progress   4.  Report > or = to 60% reduction in neck and lumbar  pain with ADLs and self-care  Baseline: shoulder and neck 30%, low back 40% Goal status: in progress   5.  Return to yardwork verbalize appropriate rest breaks and mechanics  Baseline: has been able to do gentle things, will work to do more (06/12/22) Goal status: In progress    6. Report > or = to 30% reduction in Rt jaw pain with chewing and talking   Baseline  pain is resolved at this time (06/12/22)  Goal Status: MET PLAN: PT FREQUENCY: 2x/week  PT DURATION: 8 weeks  PLANNED INTERVENTIONS: Therapeutic exercises, Therapeutic activity, Neuromuscular re-education, Balance training, Gait training, Patient/Family education, Self Care, Joint mobilization, Aquatic Therapy, Dry Needling, Spinal manipulation, Spinal mobilization, Cryotherapy, Moist heat, Taping, Traction, Manual therapy, and Re-evaluation  PLAN FOR NEXT SESSION:  manual as needed, manage exercises as needed. Encourage gentle mobility. Aquatics to manage chronic pain.    Danielle Bruce, PT 06/19/22 11:44 AM   [facility] [address] [place], [place] [address] Phone # [phone] Fax [phone]

## 2022-06-20 DIAGNOSIS — F4310 Post-traumatic stress disorder, unspecified: Secondary | ICD-10-CM | POA: Diagnosis not present

## 2022-06-20 DIAGNOSIS — G43909 Migraine, unspecified, not intractable, without status migrainosus: Secondary | ICD-10-CM | POA: Diagnosis not present

## 2022-06-20 DIAGNOSIS — F411 Generalized anxiety disorder: Secondary | ICD-10-CM | POA: Diagnosis not present

## 2022-06-20 DIAGNOSIS — R748 Abnormal levels of other serum enzymes: Secondary | ICD-10-CM | POA: Diagnosis not present

## 2022-06-20 DIAGNOSIS — F4321 Adjustment disorder with depressed mood: Secondary | ICD-10-CM | POA: Diagnosis not present

## 2022-06-21 ENCOUNTER — Ambulatory Visit (INDEPENDENT_AMBULATORY_CARE_PROVIDER_SITE_OTHER): Payer: Medicare Other | Admitting: Psychology

## 2022-06-21 DIAGNOSIS — F902 Attention-deficit hyperactivity disorder, combined type: Secondary | ICD-10-CM | POA: Diagnosis not present

## 2022-06-21 DIAGNOSIS — F4310 Post-traumatic stress disorder, unspecified: Secondary | ICD-10-CM | POA: Diagnosis not present

## 2022-06-21 DIAGNOSIS — F411 Generalized anxiety disorder: Secondary | ICD-10-CM | POA: Diagnosis not present

## 2022-06-21 DIAGNOSIS — K449 Diaphragmatic hernia without obstruction or gangrene: Secondary | ICD-10-CM | POA: Diagnosis not present

## 2022-06-22 NOTE — Progress Notes (Signed)
[facility] Counselor/Therapist Progress Note  Patient ID: Danielle Bruce, MRN: [id number],    Date: 06/21/2022  Time Spent: 60 minutes  Treatment Type: Individual Therapy  Reported Symptoms: flashbacks, responding to triggers, depression, anxiety, being busy all of the time, dissociation  Mental Status Exam: Appearance:  Casual     Behavior: Appropriate  Motor: Restlestness  Speech/Language:  Clear and Coherent  Affect: Blunt  Mood: anxious  Thought process: normal  Thought content:   WNL  Sensory/Perceptual disturbances:   WNL  Orientation: oriented to person, place, time/date, and situation  Attention: Good  Concentration: Good  Memory: WNL  Fund of knowledge:  Good  Insight:   Good  Judgment:  Fair  Impulse Control: Good   Risk Assessment: Danger to Self:  No Self-injurious Behavior: No Danger to Others: No Duty to Warn:no Physical Aggression / Violence:No  Access to Firearms a concern: No  Gang Involvement:No   Subjective: The patient attended a face-to-face individual therapy session in the office today.  The patient reports that her daughter and her family will be moving in the next few weeks.  I think she is a little overwhelmed by this and this could be causing her more anxiety and stress and we discussed today possibly holding off on the trauma work until things settle a bit with her daughter moving.  The patient is doing better than she has in the past however she did struggle some last week with dealing with all the stress related to her daughter moving.  We processed coping strategies and I encouraged her to try to continue to do mindfulness and meditation and do some reframing around the stress that she has going on right now.  She states that she is happy that they are moving to where the family members live but this will be a significant change for the patient in regard to not being able to see her grandchildren as much. Interventions: Cognitive  Behavioral Therapy, Mindfulness Meditation, Eye Movement Desensitization and Reprocessing (EMDR), Insight-Oriented, and Interpersonal  Diagnosis:PTSD (post-traumatic stress disorder)  Generalized anxiety disorder  Attention deficit hyperactivity disorder (ADHD), combined type  Plan: Plan of Care: Client Abilities/Strengths  Insightful, motivated, Supportive family Client Treatment Preferences  Outpatient Individual therapy/EMDR  Client Statement of Needs  "I think I have PTSD and I feel like I need another kind of therapy than talk therapy" Treatment Level  Outpatient Individual therapy  Symptoms  Demonstrates an exaggerated startle response.:  (Status: maintained). Depressed  or irritable mood.: (Status:maintained). Describes a reliving of the event,  particularly through dissociative flashbacks.: (Status: maintained). Displays a  significant decline in interest and engagement in activities.:  (Status: maintained). Displays significant psychological and/or physiological distress resulting from internal and external  clues that are reminiscent of the traumatic event.: (Status: maintained).  Experiences disturbances in sleep.:  (Status: maintained). Experiences disturbing  and persistent thoughts, images, and/or perceptions of the traumatic event.:  (Status: maintained). Experiences frequent nightmares.: (Status: maintained).  Feelings of hopelessness, worthlessness, or inappropriate guilt.: No Description Entered (Status:  improved). Has been exposed to a traumatic event involving actual or perceived threat of death or  serious injury.:  (Status: maintained). Impairment in social, occupational, or  other areas of functioning.:(Status: maintained). Intentionally avoids activities,  places, people, or objects (e.g., up-armored vehicles) that evoke memories of the event.: (Status: maintained). Intentionally avoids thoughts, feelings, or discussions related  to the traumatic event.:  (Status: maintained). Reports difficulty concentrating as  well as feelings of guilt (  Status:maintained). Reports response of intense fear,  helplessness, or horror to the traumatic event.:  (Status: maintained).  Problems Addressed  Unipolar Depression, Posttraumatic Stress Disorder (PTSD), Posttraumatic Stress Disorder (PTSD),   Posttraumatic Stress Disorder (PTSD), Posttraumatic Stress Disorder (PTSD)  Goals 1. Develop healthy thinking patterns and beliefs about self, others, and the world that lead to the alleviation and help prevent the relapse of  depression. Objective Identify and replace thoughts and beliefs that support depression. Target Date: 2023/01/04 Frequency: Weekly Progress: 0 Modality: individual Related Interventions 1. Explore and restructure underlying assumptions and beliefs reflected in biased self-talk that  may put the client at risk for relapse or recurrence. 2. Conduct Cognitive-Behavioral Therapy (see Cognitive Behavior Therapy by Reola Calkins; Overcoming Depression by Agapito Games al.), beginning with helping the client learn the connection among  cognition, depressive feelings, and actions. 2. Eliminate or reduce the negative impact trauma related symptoms have  on social, occupational, and family functioning. Objective Learn and implement personal skills to manage challenging situations related to trauma. Target Date: 2023/01/04 Frequency: Weekly Progress: 0 Modality: individual 3. No longer avoids persons, places, activities, and objects that are  reminiscent of the traumatic event. Objective Participate in Eye Movement Desensitization and Reprocessing (EMDR) to reduce emotional distress  related to traumatic thoughts, feelings, and images. Target Date: 2023/01/04 Frequency: Weekly Progress: 0 Modality: individual   Related Interventions 1. Utilize Eye Movement Desensitization and Reprocessing (EMDR) to reduce the client's  emotional reactivity to the  traumatic event and reduce PTSD symptoms. Objective Learn and implement guided self-dialogue to manage thoughts, feelings, and urges brought on by  encounters with trauma-related situations. Target Date: 2024-11/07 Frequency: Weekly Progress: 0 Modality: individual Related Interventions 1. Teach the client a guided self-dialogue procedure in which he/she learns to recognize  maladaptive self-talk, challenges its biases, copes with engendered feelings, overcomes  avoidance, and reinforces his/her accomplishments; review and reinforce progress, problemsolve obstacles. 4. No longer experiences intrusive event recollections, avoidance of event  reminders, intense arousal, or disinterest in activities or  relationships. 5. Thinks about or openly discusses the traumatic event with others  without experiencing psychological or physiological distress. Diagnosis Axis  none F43.10 (Posttraumatic stress disorder) - Open - [Signifier: n/a] Posttraumatic Stress  Disorder  Conditions For Discharge Achievement of treatment goals and objectives   Aijah Lattner G Hinda Lindor, LCSW

## 2022-06-26 ENCOUNTER — Ambulatory Visit: Payer: Medicare Other

## 2022-06-26 DIAGNOSIS — L814 Other melanin hyperpigmentation: Secondary | ICD-10-CM | POA: Diagnosis not present

## 2022-06-26 DIAGNOSIS — L570 Actinic keratosis: Secondary | ICD-10-CM | POA: Diagnosis not present

## 2022-06-26 DIAGNOSIS — R252 Cramp and spasm: Secondary | ICD-10-CM | POA: Diagnosis not present

## 2022-06-26 DIAGNOSIS — D225 Melanocytic nevi of trunk: Secondary | ICD-10-CM | POA: Diagnosis not present

## 2022-06-26 DIAGNOSIS — R293 Abnormal posture: Secondary | ICD-10-CM

## 2022-06-26 DIAGNOSIS — M6281 Muscle weakness (generalized): Secondary | ICD-10-CM

## 2022-06-26 DIAGNOSIS — M542 Cervicalgia: Secondary | ICD-10-CM | POA: Diagnosis not present

## 2022-06-26 DIAGNOSIS — Z85828 Personal history of other malignant neoplasm of skin: Secondary | ICD-10-CM | POA: Diagnosis not present

## 2022-06-26 DIAGNOSIS — L821 Other seborrheic keratosis: Secondary | ICD-10-CM | POA: Diagnosis not present

## 2022-06-26 DIAGNOSIS — D485 Neoplasm of uncertain behavior of skin: Secondary | ICD-10-CM | POA: Diagnosis not present

## 2022-06-26 DIAGNOSIS — M353 Polymyalgia rheumatica: Secondary | ICD-10-CM | POA: Diagnosis not present

## 2022-06-26 NOTE — Therapy (Signed)
OUTPATIENT PHYSICAL THERAPY TREATMENT   Patient Name: [name] MRN: [id number] DOB:[birth year], 62 y.o., female Today's Date: 06/26/2022       PT End of Session - 06/26/22 1140     Visit Number 41    Date for PT Re-Evaluation 08/11/22    Authorization Type Medicare B- KX now    Progress Note Due on Visit 48    PT Start Time 1102    PT Stop Time 1143    PT Time Calculation (min) 41 min    Activity Tolerance Patient tolerated treatment well    Behavior During Therapy [facility] for tasks assessed/performed                                            Past Medical History:  Diagnosis Date   Abdominal pain, unspecified site 10/18/2012   Anemia    Anxiety    Arthritis    osteoarthritis   ASCUS (atypical squamous cells of undetermined significance) on Pap smear 05/06/2005   NEG HIGH RISK HPV--C&B BIOPSY BENIGN 12/2005   Asthma    Bipolar 2 disorder (HCC)    Cancer (HCC)    skin cancer - basal cell   Colon polyps    Complication of anesthesia    anxious afterwards, will get headaches    Constipation    Depression    Fibromyalgia 10/2013   GERD (gastroesophageal reflux disease)    Hearing loss on left    Heart murmur    never had any problems   Hemorrhoids    High cholesterol    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   cytology negative   IBS (irritable bowel syndrome)    Insomnia    Lymphocytic colitis    Lymphoma (HCC)    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   Bone marrow biopsy showes 8% plasma cells IgA Lambda   Migraines    Nausea alone 10/18/2012   Osteoarthritis    Osteopenia    Peripheral neuropathy    PTSD (post-traumatic stress disorder)    Past Surgical History:  Procedure Laterality Date   BONE MARROW BIOPSY Left 12/18/2013   Plasma cell dyscrasia 8% population of plasma cells   BREAST BIOPSY Right    benign stereo   CESAREAN SECTION  40,98,11   CHOLECYSTECTOMY N/A 10/16/2012   Procedure:  LAPAROSCOPIC CHOLECYSTECTOMY;  Surgeon: Clovis Pu. Cornett, MD;  Location: [facility];  Service: General;  Laterality: N/A;   COLONOSCOPY     numerous times   DILATION AND CURETTAGE OF UTERUS     ESOPHAGOGASTRODUODENOSCOPY     HEMORRHOID SURGERY  1993   x3   IUD REMOVAL  02/2015   Mirena   LAPAROSCOPIC LYSIS OF ADHESIONS N/A 10/16/2012   Procedure: LAPAROSCOPIC LYSIS OF ADHESIONS;  Surgeon: Maisie Fus A. Cornett, MD;  Location: [facility];  Service: General;  Laterality: N/A;   LAPAROSCOPY N/A 10/16/2012   Procedure: LAPAROSCOPY DIAGNOSTIC;  Surgeon: Clovis Pu. Cornett, MD;  Location: [facility];  Service: General;  Laterality: N/A;   PELVIC LAPAROSCOPY     RADIOLOGY WITH ANESTHESIA N/A 12/16/2015   Procedure: MRI OF BRAIN WITH AND WITHOUT CONTRAST;  Surgeon: Medication Radiologist, MD;  Location: [facility];  Service: Radiology;  Laterality: N/A;   SHOULDER SURGERY  2007/2008   SPINE SURGERY  2010   cervical   Patient Active Problem List   Diagnosis Date  Noted   GAD (generalized anxiety disorder) 12/26/2017   OCD (obsessive compulsive disorder) 12/26/2017   PTSD (post-traumatic stress disorder) 12/26/2017   DDD (degenerative disc disease), cervical 07/14/2016   Primary osteoarthritis of both feet 07/14/2016   Primary osteoarthritis of both hands 07/14/2016   Other fatigue 07/14/2016   History of IBS 07/14/2016   Osteopenia of multiple sites 07/14/2016   Fibromyalgia 01/13/2016   MGUS (monoclonal gammopathy of unknown significance) 12/23/2013   Chronic cholecystitis without calculus 10/18/2012   Abdominal pain, unspecified site 10/18/2012   Nausea alone 10/18/2012   Unspecified constipation 10/18/2012   Depression 10/18/2012   Anxiety    ASCUS (atypical squamous cells of undetermined significance) on Pap smear    IUD    Hemorrhoids 11/29/2010   Abdominal pain, left upper quadrant 11/29/2010    PCP:  Lupita Raider, MD  REFERRING PROVIDER: Lupita Raider, MD  REFERRING DIAG: fibromyalgia, TMJ  dysfunction (added 04/20/22)  THERAPY DIAG:  Abnormal posture  Cramp and spasm  Muscle weakness (generalized)  Rationale for Evaluation and Treatment Rehabilitation  ONSET DATE: chronic pain (fibromyalgia) with flare-up 2 months ago  SUBJECTIVE:                                                                                                                                                                                                         SUBJECTIVE STATEMENT: I am stressed so I have tension in my neck and shoulder blades today.  I also have a headache.    PERTINENT HISTORY:  Anxiety, bipolar disorder, depression, fibromyalgia, IBS, migraines, osteopenia, PTSD  PAIN:  Are you having pain: Yes 4/10 Pain location: neck/shoulder blades  TMJ 0/10- chewing sometimes just hurts  Pain description: sore, tight  Aggravating factors: movement Relieving factors: muscle relaxers, rest, stretching  PRECAUTIONS: Other: chronic pain syndrome and depression Other: slow progression with exercise due to chronic condition  WEIGHT BEARING RESTRICTIONS No  FALLS:  Has patient fallen in last 6 months? No  LIVING ENVIRONMENT: Lives with: lives with their family Lives in: House/apartment  OCCUPATION: on disability  PLOF: Independent with basic ADLs and Leisure: yardwork, walking  Pt cares for her 4 young grandchildren- difficulty with care including lifting and carrying  PATIENT GOALS be more active with less pain, exercise at the gym regularly, lift and carry grandchildren and laundry, sleep with fewer interruptions, get back to more gardening.  OBJECTIVE:   DIAGNOSTIC FINDINGS: none recent   PATIENT SURVEYS: Eval: Fibromyalgia Impact Questionnaire: first 2 sections only: 49 12/26/21: 40  02/28/22: 28 (first 2 sections only)  COGNITION: Overall cognitive status: Within functional limits for tasks assessed  SENSATION: WFL  POSTURE: rounded shoulders and forward  head  PALPATION: Diffuse palpable tenderness over bil neck, upper traps, thoracic, lumbar and gluteals with trigger points.    CERVICAL ROM:   Active ROM A/PROM (deg) eval A/ROM 12/26/21 A/ROM 04/20/22  Flexion 55    Extension 40    Right lateral flexion 30 35 40  Left lateral flexion 35 40 45  Right rotation 50 60 65  Left rotation 40 65 65   (Blank rows = not tested) TMJ: opening limited by 25%, clicking with opening on the Rt.  Palpable tenderness over Rt masseter and at TMJ UPPER EXTREMITY ROM: UE A/ROM is limited by 20% into flexion and abduction with pain at end range.  Hip flexibility is limited by 25% in all directions with pain in all directions.  UPPER EXTREMITY MMT: UE: 4+/5, LE 4+/5 except hip flexors 4-/5  TODAY'S TREATMENT:  Date: 06/26/22 NuStep: Level 3 x 8 minutes for endurance and mobility-PT present to discuss progress. Ball roll outs forward and lateral x10 each Seated on ball: 3 way raises with 1# weights x10 Pelvic rocking and circles seated on ball x10 each Trigger Point Dry-Needling  Treatment instructions: Expect mild to moderate muscle soreness. S/S of pneumothorax if dry needled over a lung field, and to seek immediate medical attention should they occur. Patient verbalized understanding of these instructions and education.  Patient Consent Given: Yes Education handout provided: Previously provided Muscles treated: cervical multifidi, rhomboids Treatment response/outcome: Utilized skilled palpation to identify trigger points.  During dry needling able to palpate muscle twitch and muscle elongation  Elongation and release to bil thoracic and neck  Skilled palpation and monitoring by PT during dry needling    Date: 06/19/22 NuStep: Level 3 x 8 minutes for endurance and mobility-PT present to discuss progress. Ball roll outs forward and lateral x10 each Seated on ball: 3 way raises with 1# weights x10 Standing on balance pad: weight shifting 3 ways  x 1 minute Sit to stand: 2x5 with core and glute engagement  Seated hamstring stretch : 3x20 seconds  Shoulder extension with red band 2x10  06/14/22:Pt arrives for aquatic physical therapy. Treatment took place in 3.5-5.5 feet of water. Water temperature was 90 degrees F. Pt entered the pool via stairs reciprocally with mild use of rails. Pt requires buoyancy of water for support and to offload joints with strengthening exercises. Pt utilizes viscosity of the water required for strengthening. Seated water bench with 75% submersion Pt performed seated LE AROM exercises 20x in all planes,   Standing in 50%- 75% depth water pt performed water walking in all 4 directions 10x with ankle fins added for walking duration. VC for speed in order to generate appropriate current for resistance and/or UE movements. UE single buoy wts used for push/pull with UE. 50% depth standing: knee ext Bil 15x2 with small noodle, single buoy UE wt push and hold with 6 lengths of high knee marching, hip 3 ways with ankle fins 20x required some UE for balance today, core/lat press 20x with double buoy wts, 8 min horseback bicycle on yellow noodle.   HOME EXERCISE PROGRAM: Access Code: [email or web address] Access Code: [email or web address] URL: [email or web address] Date: 04/20/2022 Prepared by: Tresa Endo  Exercises - - Controlled Jaw Opening with Tongue on Roof of Mouth  - 3 x daily - 7 x weekly - 1 sets - 10 reps - Jaw Protraction  - 3 x daily -  7 x weekly - 1 sets - 10 reps ASSESSMENT:  CLINICAL IMPRESSION: Pt is overall doing well.  She has had stress and arrived with headache and neck/scapular pain.  Pt did well with exercise without any increased pain.  Pt was able to maintain upright posture while seated on the ball. Pt had good response to DN with twitch response and improved tissue mobility after DN today.  Pt with chronic pain condition and will benefit from continued land and aquatic based exercise to improve functional  mobility and reduce pain.   OBJECTIVE IMPAIRMENTS decreased activity tolerance, decreased endurance, difficulty walking, decreased strength, increased muscle spasms, impaired flexibility, improper body mechanics, postural dysfunction, and pain.   ACTIVITY LIMITATIONS carrying, lifting, sitting, standing, reach over head, hygiene/grooming, and locomotion level  PARTICIPATION LIMITATIONS: meal prep, cleaning, laundry, driving, community activity, and yard work  PERSONAL FACTORS Past/current experiences, Time since onset of injury/illness/exacerbation, and 3+ comorbidities: fibromyalgia depression, anxiety,   are also affecting patient's functional outcome.   REHAB POTENTIAL: Good  CLINICAL DECISION MAKING: Evolving/moderate complexity  EVALUATION COMPLEXITY: Moderate   GOALS: Goals reviewed with patient? Yes  SHORT TERM GOALS: Target date: 03/20/22  Return to regular performance of HEP 2-3x/day to improve mobility  Baseline: stretching 2x/day (02/28/22) Goal status: In progress  2.  Report > or = to 25% reduction in cervical and lumbar pain with ADLs and self-care  Baseline: 20% better overall (02/28/2022) Goal status: in progress   3.  Reduce fibromyalgia impact score to < or = to 39  Baseline: 28 (02/28/22) Goal status: MET    LONG TERM GOALS: Target date: 08/11/22  Be independent in advanced HEP Baseline: gentle exercise and able to progress depending on pain level (06/12/22) Goal status: In progress   2.  Reduce fibromyalgia impact questionnaire to < or = to 21 Baseline: 28 (02/28/22) Goal status: Revised   3.  Lift and carry laundry and her grandchildren with > or = to 50% reduction in pain Baseline: shoulder and neck 30%, low back 40% Goal status: In progress   4.  Report > or = to 60% reduction in neck and lumbar pain with ADLs and self-care  Baseline: shoulder and neck 30%, low back 40% Goal status: in progress   5.  Return to yardwork verbalize appropriate rest  breaks and mechanics  Baseline: has been able to do gentle things, will work to do more (06/12/22) Goal status: In progress    6. Report > or = to 30% reduction in Rt jaw pain with chewing and talking   Baseline  pain is resolved at this time (06/12/22)  Goal Status: MET PLAN: PT FREQUENCY: 2x/week  PT DURATION: 8 weeks  PLANNED INTERVENTIONS: Therapeutic exercises, Therapeutic activity, Neuromuscular re-education, Balance training, Gait training, Patient/Family education, Self Care, Joint mobilization, Aquatic Therapy, Dry Needling, Spinal manipulation, Spinal mobilization, Cryotherapy, Moist heat, Taping, Traction, Manual therapy, and Re-evaluation  PLAN FOR NEXT SESSION:  manual as needed, manage exercises as needed. Encourage gentle mobility. Aquatics to manage chronic pain.    Lorrene Reid, PT 06/26/22 11:45 AM   [facility] [address] [place], [place] [address] Phone # [phone] Fax [phone]

## 2022-06-28 ENCOUNTER — Encounter: Payer: Self-pay | Admitting: Physical Therapy

## 2022-06-28 ENCOUNTER — Ambulatory Visit: Payer: Medicare Other | Attending: Family Medicine | Admitting: Physical Therapy

## 2022-06-28 DIAGNOSIS — M353 Polymyalgia rheumatica: Secondary | ICD-10-CM | POA: Diagnosis not present

## 2022-06-28 DIAGNOSIS — M542 Cervicalgia: Secondary | ICD-10-CM | POA: Diagnosis not present

## 2022-06-28 DIAGNOSIS — R293 Abnormal posture: Secondary | ICD-10-CM | POA: Diagnosis not present

## 2022-06-28 DIAGNOSIS — M6281 Muscle weakness (generalized): Secondary | ICD-10-CM | POA: Diagnosis not present

## 2022-06-28 DIAGNOSIS — R252 Cramp and spasm: Secondary | ICD-10-CM

## 2022-06-28 NOTE — Therapy (Signed)
OUTPATIENT PHYSICAL THERAPY TREATMENT   Patient Name: Danielle Bruce MRN: [id number] DOB:1960-08-26, 62 y.o., female Today's Date: 06/28/2022       PT End of Session - 06/28/22 2147     Visit Number 42    Date for PT Re-Evaluation 08/11/22    Authorization Type Medicare B- KX now    Progress Note Due on Visit 48    PT Start Time 1100    PT Stop Time 1145    PT Time Calculation (min) 45 min    Activity Tolerance Patient tolerated treatment well    Behavior During Therapy [facility] for tasks assessed/performed                                             Past Medical History:  Diagnosis Date   Abdominal pain, unspecified site 10/18/2012   Anemia    Anxiety    Arthritis    osteoarthritis   ASCUS (atypical squamous cells of undetermined significance) on Pap smear 05/06/2005   NEG HIGH RISK HPV--C&B BIOPSY BENIGN 12/2005   Asthma    Bipolar 2 disorder (HCC)    Cancer (HCC)    skin cancer - basal cell   Colon polyps    Complication of anesthesia    anxious afterwards, will get headaches    Constipation    Depression    Fibromyalgia 10/2013   GERD (gastroesophageal reflux disease)    Hearing loss on left    Heart murmur    never had any problems   Hemorrhoids    High cholesterol    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   cytology negative   IBS (irritable bowel syndrome)    Insomnia    Lymphocytic colitis    Lymphoma (HCC)    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   Bone marrow biopsy showes 8% plasma cells IgA Lambda   Migraines    Nausea alone 10/18/2012   Osteoarthritis    Osteopenia    Peripheral neuropathy    PTSD (post-traumatic stress disorder)    Past Surgical History:  Procedure Laterality Date   BONE MARROW BIOPSY Left 12/18/2013   Plasma cell dyscrasia 8% population of plasma cells   BREAST BIOPSY Right    benign stereo   CESAREAN SECTION  40,98,11   CHOLECYSTECTOMY N/A 10/16/2012   Procedure:  LAPAROSCOPIC CHOLECYSTECTOMY;  Surgeon: Clovis Pu. Cornett, MD;  Location: [facility];  Service: General;  Laterality: N/A;   COLONOSCOPY     numerous times   DILATION AND CURETTAGE OF UTERUS     ESOPHAGOGASTRODUODENOSCOPY     HEMORRHOID SURGERY  1993   x3   IUD REMOVAL  02/2015   Mirena   LAPAROSCOPIC LYSIS OF ADHESIONS N/A 10/16/2012   Procedure: LAPAROSCOPIC LYSIS OF ADHESIONS;  Surgeon: Maisie Fus A. Cornett, MD;  Location: [facility];  Service: General;  Laterality: N/A;   LAPAROSCOPY N/A 10/16/2012   Procedure: LAPAROSCOPY DIAGNOSTIC;  Surgeon: Clovis Pu. Cornett, MD;  Location: [facility];  Service: General;  Laterality: N/A;   PELVIC LAPAROSCOPY     RADIOLOGY WITH ANESTHESIA N/A 12/16/2015   Procedure: MRI OF BRAIN WITH AND WITHOUT CONTRAST;  Surgeon: Medication Radiologist, MD;  Location: [facility];  Service: Radiology;  Laterality: N/A;   SHOULDER SURGERY  2007/2008   SPINE SURGERY  2010   cervical   Patient Active Problem List   Diagnosis  Date Noted   GAD (generalized anxiety disorder) 12/26/2017   OCD (obsessive compulsive disorder) 12/26/2017   PTSD (post-traumatic stress disorder) 12/26/2017   DDD (degenerative disc disease), cervical 07/14/2016   Primary osteoarthritis of both feet 07/14/2016   Primary osteoarthritis of both hands 07/14/2016   Other fatigue 07/14/2016   History of IBS 07/14/2016   Osteopenia of multiple sites 07/14/2016   Fibromyalgia 01/13/2016   MGUS (monoclonal gammopathy of unknown significance) 12/23/2013   Chronic cholecystitis without calculus 10/18/2012   Abdominal pain, unspecified site 10/18/2012   Nausea alone 10/18/2012   Unspecified constipation 10/18/2012   Depression 10/18/2012   Anxiety    ASCUS (atypical squamous cells of undetermined significance) on Pap smear    IUD    Hemorrhoids 11/29/2010   Abdominal pain, left upper quadrant 11/29/2010    PCP:  Lupita Raider, MD  REFERRING PROVIDER: Lupita Raider, MD  REFERRING DIAG: fibromyalgia, TMJ  dysfunction (added 04/20/22)  THERAPY DIAG:  Abnormal posture  Cramp and spasm  Muscle weakness (generalized)  Cervicalgia  Rationale for Evaluation and Treatment Rehabilitation  ONSET DATE: chronic pain (fibromyalgia) with flare-up 2 months ago  SUBJECTIVE:                                                                                                                                                                                                         SUBJECTIVE STATEMENT: Helping my daughter move and it is a lot on me.  PERTINENT HISTORY:  Anxiety, bipolar disorder, depression, fibromyalgia, IBS, migraines, osteopenia, PTSD  PAIN:  Are you having pain: Yes 4/10 Pain location: neck/shoulder blades  TMJ 0/10- chewing sometimes just hurts  Pain description: sore, tight  Aggravating factors: movement Relieving factors: muscle relaxers, rest, stretching  PRECAUTIONS: Other: chronic pain syndrome and depression Other: slow progression with exercise due to chronic condition  WEIGHT BEARING RESTRICTIONS No  FALLS:  Has patient fallen in last 6 months? No  LIVING ENVIRONMENT: Lives with: lives with their family Lives in: House/apartment  OCCUPATION: on disability  PLOF: Independent with basic ADLs and Leisure: yardwork, walking  Pt cares for her 4 young grandchildren- difficulty with care including lifting and carrying  PATIENT GOALS be more active with less pain, exercise at the gym regularly, lift and carry grandchildren and laundry, sleep with fewer interruptions, get back to more gardening.  OBJECTIVE:   DIAGNOSTIC FINDINGS: none recent   PATIENT SURVEYS: Eval: Fibromyalgia Impact Questionnaire: first 2 sections only: 49 12/26/21: 40  02/28/22: 28 (first 2 sections only)  COGNITION: Overall cognitive status: Within functional limits  for tasks assessed  SENSATION: WFL  POSTURE: rounded shoulders and forward head  PALPATION: Diffuse palpable tenderness  over bil neck, upper traps, thoracic, lumbar and gluteals with trigger points.    CERVICAL ROM:   Active ROM A/PROM (deg) eval A/ROM 12/26/21 A/ROM 04/20/22  Flexion 55    Extension 40    Right lateral flexion 30 35 40  Left lateral flexion 35 40 45  Right rotation 50 60 65  Left rotation 40 65 65   (Blank rows = not tested) TMJ: opening limited by 25%, clicking with opening on the Rt.  Palpable tenderness over Rt masseter and at TMJ UPPER EXTREMITY ROM: UE A/ROM is limited by 20% into flexion and abduction with pain at end range.  Hip flexibility is limited by 25% in all directions with pain in all directions.  UPPER EXTREMITY MMT: UE: 4+/5, LE 4+/5 except hip flexors 4-/5  TODAY'S TREATMENT:   06/28/22:Pt arrives for aquatic physical therapy. Treatment took place in 3.5-5.5 feet of water. Water temperature was 90 degrees F. Pt entered the pool via stairs reciprocally with mild use of rails. Pt requires buoyancy of water for support and to offload joints with strengthening exercises. Pt utilizes viscosity of the water required for strengthening. Seated water bench with 75% submersion Pt performed seated LE AROM exercises 20x in all planes,   Standing in 50%- 75% depth water pt performed water walking in all 4 directions 10x with ankle fins added for walking duration. VC for speed in order to generate appropriate current for resistance and/or UE movements. UE single buoy wts used for push/pull with UE. 50% depth standing: knee ext Bil 15x2 with small noodle, single buoy UE wt push and hold with 6 lengths of high knee marching, hip 3 ways with ankle fins 20x required some UE for balance today, core/lat press 20x with double buoy wts, 10 min horseback bicycle on yellow noodle.  Date: 06/26/22 NuStep: Level 3 x 8 minutes for endurance and mobility-PT present to discuss progress. Ball roll outs forward and lateral x10 each Seated on ball: 3 way raises with 1# weights x10 Pelvic rocking and  circles seated on ball x10 each Trigger Point Dry-Needling  Treatment instructions: Expect mild to moderate muscle soreness. S/S of pneumothorax if dry needled over a lung field, and to seek immediate medical attention should they occur. Patient verbalized understanding of these instructions and education.  Patient Consent Given: Yes Education handout provided: Previously provided Muscles treated: cervical multifidi, rhomboids Treatment response/outcome: Utilized skilled palpation to identify trigger points.  During dry needling able to palpate muscle twitch and muscle elongation  Elongation and release to bil thoracic and neck  Skilled palpation and monitoring by PT during dry needling    Date: 06/19/22 NuStep: Level 3 x 8 minutes for endurance and mobility-PT present to discuss progress. Ball roll outs forward and lateral x10 each Seated on ball: 3 way raises with 1# weights x10 Standing on balance pad: weight shifting 3 ways x 1 minute Sit to stand: 2x5 with core and glute engagement  Seated hamstring stretch : 3x20 seconds  Shoulder extension with red band 2x10  06/14/22:Pt arrives for aquatic physical therapy. Treatment took place in 3.5-5.5 feet of water. Water temperature was 90 degrees F. Pt entered the pool via stairs reciprocally with mild use of rails. Pt requires buoyancy of water for support and to offload joints with strengthening exercises. Pt utilizes viscosity of the water required for strengthening. Seated water bench with 75%  submersion Pt performed seated LE AROM exercises 20x in all planes,   Standing in 50%- 75% depth water pt performed water walking in all 4 directions 10x with ankle fins added for walking duration. VC for speed in order to generate appropriate current for resistance and/or UE movements. UE single buoy wts used for push/pull with UE. 50% depth standing: knee ext Bil 15x2 with small noodle, single buoy UE wt push and hold with 6 lengths of high knee  marching, hip 3 ways with ankle fins 20x required some UE for balance today, core/lat press 20x with double buoy wts, 8 min horseback bicycle on yellow noodle.   HOME EXERCISE PROGRAM: Access Code: [email or web address] Access Code: [email or web address] URL: [email or web address] Date: 04/20/2022 Prepared by: Tresa Endo  Exercises - - Controlled Jaw Opening with Tongue on Roof of Mouth  - 3 x daily - 7 x weekly - 1 sets - 10 reps - Jaw Protraction  - 3 x daily - 7 x weekly - 1 sets - 10 reps ASSESSMENT:  CLINICAL IMPRESSION:  Pt reports exercising in the water has been both mentally and physically theraputic for her. Pt continues to exercise pain free and challenge her self more while in the water. OBJECTIVE IMPAIRMENTS decreased activity tolerance, decreased endurance, difficulty walking, decreased strength, increased muscle spasms, impaired flexibility, improper body mechanics, postural dysfunction, and pain.   ACTIVITY LIMITATIONS carrying, lifting, sitting, standing, reach over head, hygiene/grooming, and locomotion level  PARTICIPATION LIMITATIONS: meal prep, cleaning, laundry, driving, community activity, and yard work  PERSONAL FACTORS Past/current experiences, Time since onset of injury/illness/exacerbation, and 3+ comorbidities: fibromyalgia depression, anxiety,   are also affecting patient's functional outcome.   REHAB POTENTIAL: Good  CLINICAL DECISION MAKING: Evolving/moderate complexity  EVALUATION COMPLEXITY: Moderate   GOALS: Goals reviewed with patient? Yes  SHORT TERM GOALS: Target date: 03/20/22  Return to regular performance of HEP 2-3x/day to improve mobility  Baseline: stretching 2x/day (02/28/22) Goal status: In progress  2.  Report > or = to 25% reduction in cervical and lumbar pain with ADLs and self-care  Baseline: 20% better overall (02/28/2022) Goal status: in progress   3.  Reduce fibromyalgia impact score to < or = to 39  Baseline: 28 (02/28/22) Goal status:  MET    LONG TERM GOALS: Target date: 08/11/22  Be independent in advanced HEP Baseline: gentle exercise and able to progress depending on pain level (06/12/22) Goal status: In progress   2.  Reduce fibromyalgia impact questionnaire to < or = to 21 Baseline: 28 (02/28/22) Goal status: Revised   3.  Lift and carry laundry and her grandchildren with > or = to 50% reduction in pain Baseline: shoulder and neck 30%, low back 40% Goal status: In progress   4.  Report > or = to 60% reduction in neck and lumbar pain with ADLs and self-care  Baseline: shoulder and neck 30%, low back 40% Goal status: in progress   5.  Return to yardwork verbalize appropriate rest breaks and mechanics  Baseline: has been able to do gentle things, will work to do more (06/12/22) Goal status: In progress    6. Report > or = to 30% reduction in Rt jaw pain with chewing and talking   Baseline  pain is resolved at this time (06/12/22)  Goal Status: MET PLAN: PT FREQUENCY: 2x/week  PT DURATION: 8 weeks  PLANNED INTERVENTIONS: Therapeutic exercises, Therapeutic activity, Neuromuscular re-education, Balance training, Gait training, Patient/Family education, Self Care, Joint mobilization, Aquatic Therapy, Dry Needling, Spinal  manipulation, Spinal mobilization, Cryotherapy, Moist heat, Taping, Traction, Manual therapy, and Re-evaluation  PLAN FOR NEXT SESSION:  manual as needed, manage exercises as needed. Encourage gentle mobility. Aquatics to manage chronic pain.    Ane Payment, PTA 06/28/22 9:48 PM   [facility] [address], [address] [place], [place] [address] Phone # [phone] Fax [phone]

## 2022-06-29 ENCOUNTER — Ambulatory Visit (INDEPENDENT_AMBULATORY_CARE_PROVIDER_SITE_OTHER): Payer: Medicare Other | Admitting: Psychology

## 2022-06-29 DIAGNOSIS — F411 Generalized anxiety disorder: Secondary | ICD-10-CM

## 2022-06-29 DIAGNOSIS — F902 Attention-deficit hyperactivity disorder, combined type: Secondary | ICD-10-CM

## 2022-06-29 DIAGNOSIS — F428 Other obsessive-compulsive disorder: Secondary | ICD-10-CM

## 2022-06-29 DIAGNOSIS — F4310 Post-traumatic stress disorder, unspecified: Secondary | ICD-10-CM | POA: Diagnosis not present

## 2022-06-29 NOTE — Progress Notes (Signed)
[facility] Counselor/Therapist Progress Note  Patient ID: Danielle Bruce, MRN: [id number],    Date: 06/29/2022  Time Spent: 60 minutes  Treatment Type: Individual Therapy  Reported Symptoms: flashbacks, responding to triggers, depression, anxiety, being busy all of the time, dissociation  Mental Status Exam: Appearance:  Casual     Behavior: Appropriate  Motor: Restlestness  Speech/Language:  Clear and Coherent  Affect: Blunt  Mood: anxious  Thought process: normal  Thought content:   WNL  Sensory/Perceptual disturbances:   WNL  Orientation: oriented to person, place, time/date, and situation  Attention: Good  Concentration: Good  Memory: WNL  Fund of knowledge:  Good  Insight:   Good  Judgment:  Fair  Impulse Control: Good   Risk Assessment: Danger to Self:  No Self-injurious Behavior: No Danger to Others: No Duty to Warn:no Physical Aggression / Violence:No  Access to Firearms a concern: No  Gang Involvement:No   Subjective: The patient attended a face-to-face individual therapy session via video visit today.  The patient gave verbal consent for the session to be on caregility.  The patient was in her home alone and the therapist was in the office.  The patient reports that her daughter actually moved yesterday and she had a very strong emotional response last evening and cried a lot.  The patient reports that she was surprised by this reaction.  We processed that this was a normal reaction considering that she has spent a lot of time with her grandchildren and her daughter and its a change for her to not have them here any longer to see them whenever she wants to.  Eventually she and her mother are talking about moving back down there to the [place] area but right now they are choosing to move.  We talked about normal feelings and reactions and the patient gives herself a very hard time about having emotions related to them leaving.  I did some insight  oriented therapy and cognitive behavioral therapy with the patient today.  Interventions: Cognitive Behavioral Therapy, Mindfulness Meditation, Eye Movement Desensitization and Reprocessing (EMDR), Insight-Oriented, and Interpersonal  Diagnosis:No diagnosis found.  Plan: Plan of Care: Client Abilities/Strengths  Insightful, motivated, Supportive family Client Treatment Preferences  Outpatient Individual therapy/EMDR  Client Statement of Needs  "I think I have PTSD and I feel like I need another kind of therapy than talk therapy" Treatment Level  Outpatient Individual therapy  Symptoms  Demonstrates an exaggerated startle response.:  (Status: maintained). Depressed  or irritable mood.: (Status:maintained). Describes a reliving of the event,  particularly through dissociative flashbacks.: (Status: maintained). Displays a  significant decline in interest and engagement in activities.:  (Status: maintained). Displays significant psychological and/or physiological distress resulting from internal and external  clues that are reminiscent of the traumatic event.: (Status: maintained).  Experiences disturbances in sleep.:  (Status: maintained). Experiences disturbing  and persistent thoughts, images, and/or perceptions of the traumatic event.:  (Status: maintained). Experiences frequent nightmares.: (Status: maintained).  Feelings of hopelessness, worthlessness, or inappropriate guilt.: No Description Entered (Status:  improved). Has been exposed to a traumatic event involving actual or perceived threat of death or  serious injury.:  (Status: maintained). Impairment in social, occupational, or  other areas of functioning.:(Status: maintained). Intentionally avoids activities,  places, people, or objects (e.g., up-armored vehicles) that evoke memories of the event.: (Status: maintained). Intentionally avoids thoughts, feelings, or discussions related  to the traumatic event.: (Status:  maintained). Reports difficulty concentrating as  well as feelings of guilt (  Status:maintained). Reports response of intense fear,  helplessness, or horror to the traumatic event.:  (Status: maintained).  Problems Addressed  Unipolar Depression, Posttraumatic Stress Disorder (PTSD), Posttraumatic Stress Disorder (PTSD),   Posttraumatic Stress Disorder (PTSD), Posttraumatic Stress Disorder (PTSD)  Goals 1. Develop healthy thinking patterns and beliefs about self, others, and the world that lead to the alleviation and help prevent the relapse of  depression. Objective Identify and replace thoughts and beliefs that support depression. Target Date: 2023/01/04 Frequency: Weekly Progress: 0 Modality: individual Related Interventions 1. Explore and restructure underlying assumptions and beliefs reflected in biased self-talk that  may put the client at risk for relapse or recurrence. 2. Conduct Cognitive-Behavioral Therapy (see Cognitive Behavior Therapy by Reola Calkins; Overcoming Depression by Agapito Games al.), beginning with helping the client learn the connection among  cognition, depressive feelings, and actions. 2. Eliminate or reduce the negative impact trauma related symptoms have  on social, occupational, and family functioning. Objective Learn and implement personal skills to manage challenging situations related to trauma. Target Date: 2023/01/04 Frequency: Weekly Progress: 0 Modality: individual 3. No longer avoids persons, places, activities, and objects that are  reminiscent of the traumatic event. Objective Participate in Eye Movement Desensitization and Reprocessing (EMDR) to reduce emotional distress  related to traumatic thoughts, feelings, and images. Target Date: 2023/01/04 Frequency: Weekly Progress: 0 Modality: individual   Related Interventions 1. Utilize Eye Movement Desensitization and Reprocessing (EMDR) to reduce the client's  emotional reactivity to the traumatic  event and reduce PTSD symptoms. Objective Learn and implement guided self-dialogue to manage thoughts, feelings, and urges brought on by  encounters with trauma-related situations. Target Date: 2024-11/07 Frequency: Weekly Progress: 0 Modality: individual Related Interventions 1. Teach the client a guided self-dialogue procedure in which he/she learns to recognize  maladaptive self-talk, challenges its biases, copes with engendered feelings, overcomes  avoidance, and reinforces his/her accomplishments; review and reinforce progress, problemsolve obstacles. 4. No longer experiences intrusive event recollections, avoidance of event  reminders, intense arousal, or disinterest in activities or  relationships. 5. Thinks about or openly discusses the traumatic event with others  without experiencing psychological or physiological distress. Diagnosis Axis  none F43.10 (Posttraumatic stress disorder) - Open - [Signifier: n/a] Posttraumatic Stress  Disorder  Conditions For Discharge Achievement of treatment goals and objectives   Kyanna Mahrt G Frederic Tones, LCSW

## 2022-07-03 ENCOUNTER — Ambulatory Visit: Payer: Medicare Other

## 2022-07-04 ENCOUNTER — Ambulatory Visit (INDEPENDENT_AMBULATORY_CARE_PROVIDER_SITE_OTHER): Payer: Medicare Other | Admitting: Psychology

## 2022-07-04 DIAGNOSIS — F428 Other obsessive-compulsive disorder: Secondary | ICD-10-CM | POA: Diagnosis not present

## 2022-07-04 DIAGNOSIS — F4310 Post-traumatic stress disorder, unspecified: Secondary | ICD-10-CM

## 2022-07-04 DIAGNOSIS — F411 Generalized anxiety disorder: Secondary | ICD-10-CM

## 2022-07-04 DIAGNOSIS — F902 Attention-deficit hyperactivity disorder, combined type: Secondary | ICD-10-CM

## 2022-07-04 NOTE — Therapy (Addendum)
OUTPATIENT PHYSICAL THERAPY TREATMENT   Patient Name: Danielle Bruce MRN: [id number] DOB:10-22-1960, 62 y.o., female Today's Date: 07/05/2022       PT End of Session - 07/05/22 2123     Visit Number 43    Date for PT Re-Evaluation 08/11/22    Authorization Type Medicare B- KX now    Progress Note Due on Visit 48    PT Start Time 1115   pt late   PT Stop Time 1150    PT Time Calculation (min) 35 min    Activity Tolerance Patient tolerated treatment well    Behavior During Therapy [facility] for tasks assessed/performed                                              Past Medical History:  Diagnosis Date   Abdominal pain, unspecified site 10/18/2012   Anemia    Anxiety    Arthritis    osteoarthritis   ASCUS (atypical squamous cells of undetermined significance) on Pap smear 05/06/2005   NEG HIGH RISK HPV--C&B BIOPSY BENIGN 12/2005   Asthma    Bipolar 2 disorder (HCC)    Cancer (HCC)    skin cancer - basal cell   Colon polyps    Complication of anesthesia    anxious afterwards, will get headaches    Constipation    Depression    Fibromyalgia 10/2013   GERD (gastroesophageal reflux disease)    Hearing loss on left    Heart murmur    never had any problems   Hemorrhoids    High cholesterol    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   cytology negative   IBS (irritable bowel syndrome)    Insomnia    Lymphocytic colitis    Lymphoma (HCC)    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   Bone marrow biopsy showes 8% plasma cells IgA Lambda   Migraines    Nausea alone 10/18/2012   Osteoarthritis    Osteopenia    Peripheral neuropathy    PTSD (post-traumatic stress disorder)    Past Surgical History:  Procedure Laterality Date   BONE MARROW BIOPSY Left 12/18/2013   Plasma cell dyscrasia 8% population of plasma cells   BREAST BIOPSY Right    benign stereo   CESAREAN SECTION  19,14,78   CHOLECYSTECTOMY N/A 10/16/2012    Procedure: LAPAROSCOPIC CHOLECYSTECTOMY;  Surgeon: Clovis Pu. Cornett, MD;  Location: [facility];  Service: General;  Laterality: N/A;   COLONOSCOPY     numerous times   DILATION AND CURETTAGE OF UTERUS     ESOPHAGOGASTRODUODENOSCOPY     HEMORRHOID SURGERY  1993   x3   IUD REMOVAL  02/2015   Mirena   LAPAROSCOPIC LYSIS OF ADHESIONS N/A 10/16/2012   Procedure: LAPAROSCOPIC LYSIS OF ADHESIONS;  Surgeon: Maisie Fus A. Cornett, MD;  Location: [facility];  Service: General;  Laterality: N/A;   LAPAROSCOPY N/A 10/16/2012   Procedure: LAPAROSCOPY DIAGNOSTIC;  Surgeon: Clovis Pu. Cornett, MD;  Location: [facility];  Service: General;  Laterality: N/A;   PELVIC LAPAROSCOPY     RADIOLOGY WITH ANESTHESIA N/A 12/16/2015   Procedure: MRI OF BRAIN WITH AND WITHOUT CONTRAST;  Surgeon: Medication Radiologist, MD;  Location: [facility];  Service: Radiology;  Laterality: N/A;   SHOULDER SURGERY  2007/2008   SPINE SURGERY  2010   cervical   Patient Active Problem  List   Diagnosis Date Noted   GAD (generalized anxiety disorder) 12/26/2017   OCD (obsessive compulsive disorder) 12/26/2017   PTSD (post-traumatic stress disorder) 12/26/2017   DDD (degenerative disc disease), cervical 07/14/2016   Primary osteoarthritis of both feet 07/14/2016   Primary osteoarthritis of both hands 07/14/2016   Other fatigue 07/14/2016   History of IBS 07/14/2016   Osteopenia of multiple sites 07/14/2016   Fibromyalgia 01/13/2016   MGUS (monoclonal gammopathy of unknown significance) 12/23/2013   Chronic cholecystitis without calculus 10/18/2012   Abdominal pain, unspecified site 10/18/2012   Nausea alone 10/18/2012   Unspecified constipation 10/18/2012   Depression 10/18/2012   Anxiety    ASCUS (atypical squamous cells of undetermined significance) on Pap smear    IUD    Hemorrhoids 11/29/2010   Abdominal pain, left upper quadrant 11/29/2010    PCP:  Lupita Raider, MD  REFERRING PROVIDER: Lupita Raider, MD  REFERRING DIAG:  fibromyalgia, TMJ dysfunction (added 04/20/22)  THERAPY DIAG:  Abnormal posture  Cramp and spasm  Muscle weakness (generalized)  Cervicalgia  Polymyalgia rheumatica (HCC)  Rationale for Evaluation and Treatment Rehabilitation  ONSET DATE: chronic pain (fibromyalgia) with flare-up 2 months ago  SUBJECTIVE:                                                                                                                                                                                                         SUBJECTIVE STATEMENT: Still stressed from helping my daughter but I think I am handling everything pretty well and my body is doing fair.  PERTINENT HISTORY:  Anxiety, bipolar disorder, depression, fibromyalgia, IBS, migraines, osteopenia, PTSD  PAIN:  Are you having pain: Yes 4/10 Pain location: LT hip  TMJ 0/10- chewing sometimes just hurts  Pain description: sore, tight  Aggravating factors: movement Relieving factors: muscle relaxers, rest, stretching  PRECAUTIONS: Other: chronic pain syndrome and depression Other: slow progression with exercise due to chronic condition  WEIGHT BEARING RESTRICTIONS No  FALLS:  Has patient fallen in last 6 months? No  LIVING ENVIRONMENT: Lives with: lives with their family Lives in: House/apartment  OCCUPATION: on disability  PLOF: Independent with basic ADLs and Leisure: yardwork, walking  Pt cares for her 4 young grandchildren- difficulty with care including lifting and carrying  PATIENT GOALS be more active with less pain, exercise at the gym regularly, lift and carry grandchildren and laundry, sleep with fewer interruptions, get back to more gardening.  OBJECTIVE:   DIAGNOSTIC FINDINGS: none recent   PATIENT SURVEYS: Eval: Fibromyalgia Impact Questionnaire: first 2 sections only:  49 12/26/21: 40  02/28/22: 28 (first 2 sections only)  COGNITION: Overall cognitive status: Within functional limits for tasks  assessed  SENSATION: WFL  POSTURE: rounded shoulders and forward head  PALPATION: Diffuse palpable tenderness over bil neck, upper traps, thoracic, lumbar and gluteals with trigger points.    CERVICAL ROM:   Active ROM A/PROM (deg) eval A/ROM 12/26/21 A/ROM 04/20/22  Flexion 55    Extension 40    Right lateral flexion 30 35 40  Left lateral flexion 35 40 45  Right rotation 50 60 65  Left rotation 40 65 65   (Blank rows = not tested) TMJ: opening limited by 25%, clicking with opening on the Rt.  Palpable tenderness over Rt masseter and at TMJ UPPER EXTREMITY ROM: UE A/ROM is limited by 20% into flexion and abduction with pain at end range.  Hip flexibility is limited by 25% in all directions with pain in all directions.  UPPER EXTREMITY MMT: UE: 4+/5, LE 4+/5 except hip flexors 4-/5  TODAY'S TREATMENT:   07/05/22:Pt arrives for aquatic physical therapy. Treatment took place in 3.5-5.5 feet of water. Water temperature was 90 degrees F. Pt entered the pool via stairs reciprocally with mild use of rails. Pt requires buoyancy of water for support and to offload joints with strengthening exercises. Pt utilizes viscosity of the water required for strengthening. Seated water bench with 75% submersion Pt performed seated LE AROM exercises 20x in all planes,   Standing in 50%- 75% depth water pt performed water walking in all 4 directions 10x with ankle fins added for walking duration. VC for speed in order to generate appropriate current for resistance and/or UE movements. UE single buoy wts used for push/pull with UE. 50% depth standing: knee ext Bil 15x2 with small noodle, single buoy UE wt push and hold with 6 lengths of high knee marching, hip 3 ways with ankle fins 20x required some UE for balance today, core/lat press 20x with double buoy wts, 10 min horseback bicycle on yellow noodle.   06/28/22:Pt arrives for aquatic physical therapy. Treatment took place in 3.5-5.5 feet of water.  Water temperature was 90 degrees F. Pt entered the pool via stairs reciprocally with mild use of rails. Pt requires buoyancy of water for support and to offload joints with strengthening exercises. Pt utilizes viscosity of the water required for strengthening. Seated water bench with 75% submersion Pt performed seated LE AROM exercises 20x in all planes,   Standing in 50%- 75% depth water pt performed water walking in all 4 directions 10x with ankle fins added for walking duration. VC for speed in order to generate appropriate current for resistance and/or UE movements. UE single buoy wts used for push/pull with UE. 50% depth standing: knee ext Bil 15x2 with small noodle, single buoy UE wt push and hold with 6 lengths of high knee marching, hip 3 ways with ankle fins 20x required some UE for balance today, core/lat press 20x with double buoy wts, 10 min horseback bicycle on yellow noodle.  Date: 06/26/22 NuStep: Level 3 x 8 minutes for endurance and mobility-PT present to discuss progress. Ball roll outs forward and lateral x10 each Seated on ball: 3 way raises with 1# weights x10 Pelvic rocking and circles seated on ball x10 each Trigger Point Dry-Needling  Treatment instructions: Expect mild to moderate muscle soreness. S/S of pneumothorax if dry needled over a lung field, and to seek immediate medical attention should they occur. Patient verbalized understanding of these instructions  and education.  Patient Consent Given: Yes Education handout provided: Previously provided Muscles treated: cervical multifidi, rhomboids Treatment response/outcome: Utilized skilled palpation to identify trigger points.  During dry needling able to palpate muscle twitch and muscle elongation  Elongation and release to bil thoracic and neck  Skilled palpation and monitoring by PT during dry needling    HOME EXERCISE PROGRAM: Access Code: [email or web address] Access Code: [email or web address] URL:  [email or web address] Date: 04/20/2022 Prepared by: Tresa Endo  Exercises - - Controlled Jaw Opening with Tongue on Roof of Mouth  - 3 x daily - 7 x weekly - 1 sets - 10 reps - Jaw Protraction  - 3 x daily - 7 x weekly - 1 sets - 10 reps ASSESSMENT:  CLINICAL IMPRESSION:  Pt independent in her aquatic exercises. She is probably nearing the end of her current plan of care.   OBJECTIVE IMPAIRMENTS decreased activity tolerance, decreased endurance, difficulty walking, decreased strength, increased muscle spasms, impaired flexibility, improper body mechanics, postural dysfunction, and pain.   ACTIVITY LIMITATIONS carrying, lifting, sitting, standing, reach over head, hygiene/grooming, and locomotion level  PARTICIPATION LIMITATIONS: meal prep, cleaning, laundry, driving, community activity, and yard work  PERSONAL FACTORS Past/current experiences, Time since onset of injury/illness/exacerbation, and 3+ comorbidities: fibromyalgia depression, anxiety,   are also affecting patient's functional outcome.   REHAB POTENTIAL: Good  CLINICAL DECISION MAKING: Evolving/moderate complexity  EVALUATION COMPLEXITY: Moderate   GOALS: Goals reviewed with patient? Yes  SHORT TERM GOALS: Target date: 03/20/22  Return to regular performance of HEP 2-3x/day to improve mobility  Baseline: stretching 2x/day (02/28/22) Goal status: In progress  2.  Report > or = to 25% reduction in cervical and lumbar pain with ADLs and self-care  Baseline: 20% better overall (02/28/2022) Goal status: in progress   3.  Reduce fibromyalgia impact score to < or = to 39  Baseline: 28 (02/28/22) Goal status: MET    LONG TERM GOALS: Target date: 08/11/22  Be independent in advanced HEP Baseline: gentle exercise and able to progress depending on pain level (06/12/22) Goal status: In progress   2.  Reduce fibromyalgia impact questionnaire to < or = to 21 Baseline: 28 (02/28/22) Goal status: Revised   3.  Lift  and carry laundry and her grandchildren with > or = to 50% reduction in pain Baseline: shoulder and neck 30%, low back 40% Goal status: In progress   4.  Report > or = to 60% reduction in neck and lumbar pain with ADLs and self-care  Baseline: shoulder and neck 30%, low back 40% Goal status: in progress   5.  Return to yardwork verbalize appropriate rest breaks and mechanics  Baseline: has been able to do gentle things, will work to do more (06/12/22) Goal status: In progress    6. Report > or = to 30% reduction in Rt jaw pain with chewing and talking   Baseline  pain is resolved at this time (06/12/22)  Goal Status: MET PLAN: PT FREQUENCY: 2x/week  PT DURATION: 8 weeks  PLANNED INTERVENTIONS: Therapeutic exercises, Therapeutic activity, Neuromuscular re-education, Balance training, Gait training, Patient/Family education, Self Care, Joint mobilization, Aquatic Therapy, Dry Needling, Spinal manipulation, Spinal mobilization, Cryotherapy, Moist heat, Taping, Traction, Manual therapy, and Re-evaluation  PLAN FOR NEXT SESSION: ERO may be next week.   Ane Payment, PTA 07/05/22 9:28 PM    [facility] [address] [place], [place] [address] Phone # [phone] Fax [phone]

## 2022-07-05 ENCOUNTER — Ambulatory Visit: Payer: Medicare Other | Admitting: Physical Therapy

## 2022-07-05 ENCOUNTER — Encounter: Payer: Self-pay | Admitting: Physical Therapy

## 2022-07-05 DIAGNOSIS — R252 Cramp and spasm: Secondary | ICD-10-CM | POA: Diagnosis not present

## 2022-07-05 DIAGNOSIS — M353 Polymyalgia rheumatica: Secondary | ICD-10-CM

## 2022-07-05 DIAGNOSIS — R293 Abnormal posture: Secondary | ICD-10-CM

## 2022-07-05 DIAGNOSIS — M542 Cervicalgia: Secondary | ICD-10-CM

## 2022-07-05 DIAGNOSIS — M6281 Muscle weakness (generalized): Secondary | ICD-10-CM

## 2022-07-05 NOTE — Progress Notes (Signed)
[facility] Counselor/Therapist Progress Note  Patient ID: Danielle Bruce, MRN: [id number],    Date: 07/04/2022  Time Spent: 60 minutes  Treatment Type: Individual Therapy  Reported Symptoms: flashbacks, responding to triggers, depression, anxiety, being busy all of the time, dissociation  Mental Status Exam: Appearance:  Casual     Behavior: Appropriate  Motor: Restlestness  Speech/Language:  Clear and Coherent  Affect: Blunt  Mood: anxious  Thought process: normal  Thought content:   WNL  Sensory/Perceptual disturbances:   WNL  Orientation: oriented to person, place, time/date, and situation  Attention: Good  Concentration: Good  Memory: WNL  Fund of knowledge:  Good  Insight:   Good  Judgment:  Fair  Impulse Control: Good   Risk Assessment: Danger to Self:  No Self-injurious Behavior: No Danger to Others: No Duty to Warn:no Physical Aggression / Violence:No  Access to Firearms a concern: No  Gang Involvement:No   Subjective: The patient attended a face-to-face individual therapy session in the office today.  The patient presents with a blunted affect and mood is anxious.  The patient is still trying to deal with the fact that her daughter has moved and she is going over there and she is packing up her house for her which is a good thing.  We talked about the plans that they have of possibly moving down that way at some point.  The patient is still grieving the loss of her grandbabies and daughter.  She seems to be doing a little better with it than she was previously.  We will start addressing some of her trauma issues when things settle down a little bit more in day-to-day life.  Encouraged the patient to continue to take care of herself and do that more now that her daughter and grandchildren have moved.  Interventions: Cognitive Behavioral Therapy, Mindfulness Meditation, Eye Movement Desensitization and Reprocessing (EMDR), Insight-Oriented, and  Interpersonal  Diagnosis:PTSD (post-traumatic stress disorder)  Generalized anxiety disorder  Attention deficit hyperactivity disorder (ADHD), combined type  Obsessional thoughts  Plan: Plan of Care: Client Abilities/Strengths  Insightful, motivated, Supportive family Client Treatment Preferences  Outpatient Individual therapy/EMDR  Client Statement of Needs  "I think I have PTSD and I feel like I need another kind of therapy than talk therapy" Treatment Level  Outpatient Individual therapy  Symptoms  Demonstrates an exaggerated startle response.:  (Status: maintained). Depressed  or irritable mood.: (Status:maintained). Describes a reliving of the event,  particularly through dissociative flashbacks.: (Status: maintained). Displays a  significant decline in interest and engagement in activities.:  (Status: maintained). Displays significant psychological and/or physiological distress resulting from internal and external  clues that are reminiscent of the traumatic event.: (Status: maintained).  Experiences disturbances in sleep.:  (Status: maintained). Experiences disturbing  and persistent thoughts, images, and/or perceptions of the traumatic event.:  (Status: maintained). Experiences frequent nightmares.: (Status: maintained).  Feelings of hopelessness, worthlessness, or inappropriate guilt.: No Description Entered (Status:  improved). Has been exposed to a traumatic event involving actual or perceived threat of death or  serious injury.:  (Status: maintained). Impairment in social, occupational, or  other areas of functioning.:(Status: maintained). Intentionally avoids activities,  places, people, or objects (e.g., up-armored vehicles) that evoke memories of the event.: (Status: maintained). Intentionally avoids thoughts, feelings, or discussions related  to the traumatic event.: (Status: maintained). Reports difficulty concentrating as  well as feelings of guilt  (Status:maintained). Reports response of intense fear,  helplessness, or horror to the traumatic event.:  (Status: maintained).  Problems Addressed  Unipolar Depression, Posttraumatic Stress Disorder (PTSD), Posttraumatic Stress Disorder (PTSD),   Posttraumatic Stress Disorder (PTSD), Posttraumatic Stress Disorder (PTSD)  Goals 1. Develop healthy thinking patterns and beliefs about self, others, and the world that lead to the alleviation and help prevent the relapse of  depression. Objective Identify and replace thoughts and beliefs that support depression. Target Date: 2023/01/04 Frequency: Weekly Progress: 0 Modality: individual Related Interventions 1. Explore and restructure underlying assumptions and beliefs reflected in biased self-talk that  may put the client at risk for relapse or recurrence. 2. Conduct Cognitive-Behavioral Therapy (see Cognitive Behavior Therapy by Reola Calkins; Overcoming Depression by Agapito Games al.), beginning with helping the client learn the connection among  cognition, depressive feelings, and actions. 2. Eliminate or reduce the negative impact trauma related symptoms have  on social, occupational, and family functioning. Objective Learn and implement personal skills to manage challenging situations related to trauma. Target Date: 2023/01/04 Frequency: Weekly Progress: 0 Modality: individual 3. No longer avoids persons, places, activities, and objects that are  reminiscent of the traumatic event. Objective Participate in Eye Movement Desensitization and Reprocessing (EMDR) to reduce emotional distress  related to traumatic thoughts, feelings, and images. Target Date: 2023/01/04 Frequency: Weekly Progress: 0 Modality: individual   Related Interventions 1. Utilize Eye Movement Desensitization and Reprocessing (EMDR) to reduce the client's  emotional reactivity to the traumatic event and reduce PTSD symptoms. Objective Learn and implement guided  self-dialogue to manage thoughts, feelings, and urges brought on by  encounters with trauma-related situations. Target Date: 2024-11/07 Frequency: Weekly Progress: 0 Modality: individual Related Interventions 1. Teach the client a guided self-dialogue procedure in which he/she learns to recognize  maladaptive self-talk, challenges its biases, copes with engendered feelings, overcomes  avoidance, and reinforces his/her accomplishments; review and reinforce progress, problemsolve obstacles. 4. No longer experiences intrusive event recollections, avoidance of event  reminders, intense arousal, or disinterest in activities or  relationships. 5. Thinks about or openly discusses the traumatic event with others  without experiencing psychological or physiological distress. Diagnosis Axis  none F43.10 (Posttraumatic stress disorder) - Open - [Signifier: n/a] Posttraumatic Stress  Disorder  Conditions For Discharge Achievement of treatment goals and objectives   Kaiana Marion G Nazarene Bunning, LCSW

## 2022-07-06 ENCOUNTER — Other Ambulatory Visit: Payer: Medicare Other

## 2022-07-10 ENCOUNTER — Telehealth: Payer: Self-pay | Admitting: Psychiatry

## 2022-07-10 ENCOUNTER — Ambulatory Visit: Payer: Medicare Other

## 2022-07-10 ENCOUNTER — Other Ambulatory Visit: Payer: Self-pay | Admitting: Psychiatry

## 2022-07-10 DIAGNOSIS — R252 Cramp and spasm: Secondary | ICD-10-CM

## 2022-07-10 DIAGNOSIS — F902 Attention-deficit hyperactivity disorder, combined type: Secondary | ICD-10-CM

## 2022-07-10 DIAGNOSIS — M6281 Muscle weakness (generalized): Secondary | ICD-10-CM | POA: Diagnosis not present

## 2022-07-10 DIAGNOSIS — M353 Polymyalgia rheumatica: Secondary | ICD-10-CM

## 2022-07-10 DIAGNOSIS — M542 Cervicalgia: Secondary | ICD-10-CM | POA: Diagnosis not present

## 2022-07-10 DIAGNOSIS — R293 Abnormal posture: Secondary | ICD-10-CM | POA: Diagnosis not present

## 2022-07-10 DIAGNOSIS — F5081 Binge eating disorder: Secondary | ICD-10-CM

## 2022-07-10 MED ORDER — LISDEXAMFETAMINE DIMESYLATE 50 MG PO CAPS: 50.0000 mg | ORAL_CAPSULE | Freq: Every day | ORAL | 0 refills | Status: DC

## 2022-07-10 NOTE — Telephone Encounter (Signed)
Pt called and would like a refill on her vyvanse 50 mg . Pharmacy is [facility] on [address]

## 2022-07-10 NOTE — Therapy (Signed)
OUTPATIENT PHYSICAL THERAPY TREATMENT   Patient Name: Danielle Bruce MRN: [id number] DOB:January 07, 1961, 62 y.o., female Today's Date: 07/10/2022       PT End of Session - 07/10/22 1110     Visit Number 44    Date for PT Re-Evaluation 08/11/22    Authorization Type Medicare B- KX now    Progress Note Due on Visit 48    PT Start Time 1100    PT Stop Time 1141    PT Time Calculation (min) 41 min    Activity Tolerance Patient tolerated treatment well    Behavior During Therapy [facility] for tasks assessed/performed                                               Past Medical History:  Diagnosis Date   Abdominal pain, unspecified site 10/18/2012   Anemia    Anxiety    Arthritis    osteoarthritis   ASCUS (atypical squamous cells of undetermined significance) on Pap smear 05/06/2005   NEG HIGH RISK HPV--C&B BIOPSY BENIGN 12/2005   Asthma    Bipolar 2 disorder (HCC)    Cancer (HCC)    skin cancer - basal cell   Colon polyps    Complication of anesthesia    anxious afterwards, will get headaches    Constipation    Depression    Fibromyalgia 10/2013   GERD (gastroesophageal reflux disease)    Hearing loss on left    Heart murmur    never had any problems   Hemorrhoids    High cholesterol    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   cytology negative   IBS (irritable bowel syndrome)    Insomnia    Lymphocytic colitis    Lymphoma (HCC)    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   Bone marrow biopsy showes 8% plasma cells IgA Lambda   Migraines    Nausea alone 10/18/2012   Osteoarthritis    Osteopenia    Peripheral neuropathy    PTSD (post-traumatic stress disorder)    Past Surgical History:  Procedure Laterality Date   BONE MARROW BIOPSY Left 12/18/2013   Plasma cell dyscrasia 8% population of plasma cells   BREAST BIOPSY Right    benign stereo   CESAREAN SECTION  14,78,29   CHOLECYSTECTOMY N/A 10/16/2012   Procedure:  LAPAROSCOPIC CHOLECYSTECTOMY;  Surgeon: Clovis Pu. Cornett, MD;  Location: [facility];  Service: General;  Laterality: N/A;   COLONOSCOPY     numerous times   DILATION AND CURETTAGE OF UTERUS     ESOPHAGOGASTRODUODENOSCOPY     HEMORRHOID SURGERY  1993   x3   IUD REMOVAL  02/2015   Mirena   LAPAROSCOPIC LYSIS OF ADHESIONS N/A 10/16/2012   Procedure: LAPAROSCOPIC LYSIS OF ADHESIONS;  Surgeon: Maisie Fus A. Cornett, MD;  Location: [facility];  Service: General;  Laterality: N/A;   LAPAROSCOPY N/A 10/16/2012   Procedure: LAPAROSCOPY DIAGNOSTIC;  Surgeon: Clovis Pu. Cornett, MD;  Location: [facility];  Service: General;  Laterality: N/A;   PELVIC LAPAROSCOPY     RADIOLOGY WITH ANESTHESIA N/A 12/16/2015   Procedure: MRI OF BRAIN WITH AND WITHOUT CONTRAST;  Surgeon: Medication Radiologist, MD;  Location: [facility];  Service: Radiology;  Laterality: N/A;   SHOULDER SURGERY  2007/2008   SPINE SURGERY  2010   cervical   Patient Active Problem List  Diagnosis Date Noted   GAD (generalized anxiety disorder) 12/26/2017   OCD (obsessive compulsive disorder) 12/26/2017   PTSD (post-traumatic stress disorder) 12/26/2017   DDD (degenerative disc disease), cervical 07/14/2016   Primary osteoarthritis of both feet 07/14/2016   Primary osteoarthritis of both hands 07/14/2016   Other fatigue 07/14/2016   History of IBS 07/14/2016   Osteopenia of multiple sites 07/14/2016   Fibromyalgia 01/13/2016   MGUS (monoclonal gammopathy of unknown significance) 12/23/2013   Chronic cholecystitis without calculus 10/18/2012   Abdominal pain, unspecified site 10/18/2012   Nausea alone 10/18/2012   Unspecified constipation 10/18/2012   Depression 10/18/2012   Anxiety    ASCUS (atypical squamous cells of undetermined significance) on Pap smear    IUD    Hemorrhoids 11/29/2010   Abdominal pain, left upper quadrant 11/29/2010    PCP:  Lupita Raider, MD  REFERRING PROVIDER: Lupita Raider, MD  REFERRING DIAG: fibromyalgia, TMJ  dysfunction (added 04/20/22)  THERAPY DIAG:  Abnormal posture  Cramp and spasm  Muscle weakness (generalized)  Polymyalgia rheumatica (HCC)  Cervicalgia  Rationale for Evaluation and Treatment Rehabilitation  ONSET DATE: chronic pain (fibromyalgia) with flare-up 2 months ago  SUBJECTIVE:                                                                                                                                                                                                         SUBJECTIVE STATEMENT: I spent all last week packing at my daughter's house.  I did yard work all day yesterday and I was really hurting after this.  Lt shoulder blade is the worst part right now.    PERTINENT HISTORY:  Anxiety, bipolar disorder, depression, fibromyalgia, IBS, migraines, osteopenia, PTSD  PAIN:  Are you having pain: Yes 4/10 Pain location: Lt scapula  TMJ 0/10- chewing sometimes just hurts  Pain description: sore, tight  Aggravating factors: doing too much Relieving factors: muscle relaxers, rest, stretching  PRECAUTIONS: Other: chronic pain syndrome and depression Other: slow progression with exercise due to chronic condition  WEIGHT BEARING RESTRICTIONS No  FALLS:  Has patient fallen in last 6 months? No  LIVING ENVIRONMENT: Lives with: lives with their family Lives in: House/apartment  OCCUPATION: on disability  PLOF: Independent with basic ADLs and Leisure: yardwork, walking  Pt cares for her 4 young grandchildren- difficulty with care including lifting and carrying  PATIENT GOALS be more active with less pain, exercise at the gym regularly, lift and carry grandchildren and laundry, sleep with fewer interruptions, get back to more gardening.  OBJECTIVE:   DIAGNOSTIC  FINDINGS: none recent   PATIENT SURVEYS: Eval: Fibromyalgia Impact Questionnaire: first 2 sections only: 49 12/26/21: 40  02/28/22: 28 (first 2 sections only)  COGNITION: Overall cognitive status:  Within functional limits for tasks assessed  SENSATION: WFL  POSTURE: rounded shoulders and forward head  PALPATION: Diffuse palpable tenderness over bil neck, upper traps, thoracic, lumbar and gluteals with trigger points.    CERVICAL ROM:   Active ROM A/PROM (deg) eval A/ROM 12/26/21 A/ROM 04/20/22  Flexion 55    Extension 40    Right lateral flexion 30 35 40  Left lateral flexion 35 40 45  Right rotation 50 60 65  Left rotation 40 65 65   (Blank rows = not tested) TMJ: opening limited by 25%, clicking with opening on the Rt.  Palpable tenderness over Rt masseter and at TMJ UPPER EXTREMITY ROM: UE A/ROM is limited by 20% into flexion and abduction with pain at end range.  Hip flexibility is limited by 25% in all directions with pain in all directions.  UPPER EXTREMITY MMT: UE: 4+/5, LE 4+/5 except hip flexors 4-/5  TODAY'S TREATMENT:  Date: 07/10/22 NuStep: Level 3 x 10 minutes for endurance and mobility-PT present to discuss progress. Ball roll outs forward and lateral x10 each Seated on ball: 3 way raises with 1# weights x10 Standing rows and shoulder extension: red band  Trigger Point Dry-Needling  Treatment instructions: Expect mild to moderate muscle soreness. S/S of pneumothorax if dry needled over a lung field, and to seek immediate medical attention should they occur. Patient verbalized understanding of these instructions and education.  Patient Consent Given: Yes Education handout provided: Previously provided Muscles treated: Lt rhomboids, upper trap, Lt thoracic multifidi Treatment response/outcome: Utilized skilled palpation to identify trigger points.  During dry needling able to palpate muscle twitch and muscle elongation  Elongation and release to Lt rhomboids and upper traps  Skilled palpation and monitoring by PT during dry needling   07/05/22:Pt arrives for aquatic physical therapy. Treatment took place in 3.5-5.5 feet of water. Water temperature was 90  degrees F. Pt entered the pool via stairs reciprocally with mild use of rails. Pt requires buoyancy of water for support and to offload joints with strengthening exercises. Pt utilizes viscosity of the water required for strengthening. Seated water bench with 75% submersion Pt performed seated LE AROM exercises 20x in all planes,   Standing in 50%- 75% depth water pt performed water walking in all 4 directions 10x with ankle fins added for walking duration. VC for speed in order to generate appropriate current for resistance and/or UE movements. UE single buoy wts used for push/pull with UE. 50% depth standing: knee ext Bil 15x2 with small noodle, single buoy UE wt push and hold with 6 lengths of high knee marching, hip 3 ways with ankle fins 20x required some UE for balance today, core/lat press 20x with double buoy wts, 10 min horseback bicycle on yellow noodle.   06/28/22:Pt arrives for aquatic physical therapy. Treatment took place in 3.5-5.5 feet of water. Water temperature was 90 degrees F. Pt entered the pool via stairs reciprocally with mild use of rails. Pt requires buoyancy of water for support and to offload joints with strengthening exercises. Pt utilizes viscosity of the water required for strengthening. Seated water bench with 75% submersion Pt performed seated LE AROM exercises 20x in all planes,   Standing in 50%- 75% depth water pt performed water walking in all 4 directions 10x with ankle fins added for  walking duration. VC for speed in order to generate appropriate current for resistance and/or UE movements. UE single buoy wts used for push/pull with UE. 50% depth standing: knee ext Bil 15x2 with small noodle, single buoy UE wt push and hold with 6 lengths of high knee marching, hip 3 ways with ankle fins 20x required some UE for balance today, core/lat press 20x with double buoy wts, 10 min horseback bicycle on yellow noodle.   HOME EXERCISE PROGRAM: Access Code: [email or web address] Access Code:  [email or web address] URL: [email or web address] Date: 04/20/2022 Prepared by: Tresa Endo  Exercises - - Controlled Jaw Opening with Tongue on Roof of Mouth  - 3 x daily - 7 x weekly - 1 sets - 10 reps - Jaw Protraction  - 3 x daily - 7 x weekly - 1 sets - 10 reps ASSESSMENT:  CLINICAL IMPRESSION:  Pt was able to be active at her daughter's house and did a full day of gardening yesterday.  She did report Lt scapular pain and tension after this and PT discussed doing shorter bouts next time and taking breaks to stretch and rest.  Pt was able to participate in exercise in the clinic today and had good response to DN to the Lt scapular region.  Overall she is able to be much more active and tolerate more load than in the past.  Patient will benefit from skilled PT to address the below impairments and improve overall function.   OBJECTIVE IMPAIRMENTS decreased activity tolerance, decreased endurance, difficulty walking, decreased strength, increased muscle spasms, impaired flexibility, improper body mechanics, postural dysfunction, and pain.   ACTIVITY LIMITATIONS carrying, lifting, sitting, standing, reach over head, hygiene/grooming, and locomotion level  PARTICIPATION LIMITATIONS: meal prep, cleaning, laundry, driving, community activity, and yard work  PERSONAL FACTORS Past/current experiences, Time since onset of injury/illness/exacerbation, and 3+ comorbidities: fibromyalgia depression, anxiety,   are also affecting patient's functional outcome.   REHAB POTENTIAL: Good  CLINICAL DECISION MAKING: Evolving/moderate complexity  EVALUATION COMPLEXITY: Moderate   GOALS: Goals reviewed with patient? Yes  SHORT TERM GOALS: Target date: 03/20/22  Return to regular performance of HEP 2-3x/day to improve mobility  Baseline: stretching 2x/day (02/28/22) Goal status: In progress  2.  Report > or = to 25% reduction in cervical and lumbar pain with ADLs and self-care  Baseline: 20% better overall  (02/28/2022) Goal status: in progress   3.  Reduce fibromyalgia impact score to < or = to 39  Baseline: 28 (02/28/22) Goal status: MET    LONG TERM GOALS: Target date: 08/11/22  Be independent in advanced HEP Baseline: gentle exercise and able to progress depending on pain level (06/12/22) Goal status: In progress   2.  Reduce fibromyalgia impact questionnaire to < or = to 21 Baseline: 28 (02/28/22) Goal status: Revised   3.  Lift and carry laundry and her grandchildren with > or = to 50% reduction in pain Baseline: shoulder and neck 30%, low back 40% Goal status: In progress   4.  Report > or = to 60% reduction in neck and lumbar pain with ADLs and self-care  Baseline: shoulder and neck 30%, low back 40% Goal status: in progress   5.  Return to yardwork verbalize appropriate rest breaks and mechanics  Baseline: has been able to do gentle things, will work to do more (06/12/22) Goal status: In progress    6. Report > or = to 30% reduction in Rt jaw pain with chewing and talking   Baseline  pain is resolved at  this time (06/12/22)  Goal Status: MET PLAN: PT FREQUENCY: 2x/week  PT DURATION: 8 weeks  PLANNED INTERVENTIONS: Therapeutic exercises, Therapeutic activity, Neuromuscular re-education, Balance training, Gait training, Patient/Family education, Self Care, Joint mobilization, Aquatic Therapy, Dry Needling, Spinal manipulation, Spinal mobilization, Cryotherapy, Moist heat, Taping, Traction, Manual therapy, and Re-evaluation  PLAN FOR NEXT SESSION:   Lorrene Reid, PT 07/10/22 11:42 AM    [facility] [address], [address] [place], [place] [address] Phone # [phone] Fax [phone]

## 2022-07-11 ENCOUNTER — Ambulatory Visit: Payer: Medicare Other | Admitting: Psychology

## 2022-07-11 NOTE — Therapy (Signed)
OUTPATIENT PHYSICAL THERAPY TREATMENT   Patient Name: Danielle Bruce MRN: [id number] DOB:10-25-1960, 62 y.o., female Today's Date: 07/12/2022       PT End of Session - 07/12/22 1753     Visit Number 45    Date for PT Re-Evaluation 08/11/22    Authorization Type Medicare B- KX now    Progress Note Due on Visit 48    PT Start Time 1100    PT Stop Time 1145    PT Time Calculation (min) 45 min    Activity Tolerance Patient tolerated treatment well                                                Past Medical History:  Diagnosis Date   Abdominal pain, unspecified site 10/18/2012   Anemia    Anxiety    Arthritis    osteoarthritis   ASCUS (atypical squamous cells of undetermined significance) on Pap smear 05/06/2005   NEG HIGH RISK HPV--C&B BIOPSY BENIGN 12/2005   Asthma    Bipolar 2 disorder (HCC)    Cancer (HCC)    skin cancer - basal cell   Colon polyps    Complication of anesthesia    anxious afterwards, will get headaches    Constipation    Depression    Fibromyalgia 10/2013   GERD (gastroesophageal reflux disease)    Hearing loss on left    Heart murmur    never had any problems   Hemorrhoids    High cholesterol    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   cytology negative   IBS (irritable bowel syndrome)    Insomnia    Lymphocytic colitis    Lymphoma (HCC)    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   Bone marrow biopsy showes 8% plasma cells IgA Lambda   Migraines    Nausea alone 10/18/2012   Osteoarthritis    Osteopenia    Peripheral neuropathy    PTSD (post-traumatic stress disorder)    Past Surgical History:  Procedure Laterality Date   BONE MARROW BIOPSY Left 12/18/2013   Plasma cell dyscrasia 8% population of plasma cells   BREAST BIOPSY Right    benign stereo   CESAREAN SECTION  78,29,56   CHOLECYSTECTOMY N/A 10/16/2012   Procedure: LAPAROSCOPIC CHOLECYSTECTOMY;  Surgeon: Clovis Pu. Cornett,  MD;  Location: [facility];  Service: General;  Laterality: N/A;   COLONOSCOPY     numerous times   DILATION AND CURETTAGE OF UTERUS     ESOPHAGOGASTRODUODENOSCOPY     HEMORRHOID SURGERY  1993   x3   IUD REMOVAL  02/2015   Mirena   LAPAROSCOPIC LYSIS OF ADHESIONS N/A 10/16/2012   Procedure: LAPAROSCOPIC LYSIS OF ADHESIONS;  Surgeon: Maisie Fus A. Cornett, MD;  Location: [facility];  Service: General;  Laterality: N/A;   LAPAROSCOPY N/A 10/16/2012   Procedure: LAPAROSCOPY DIAGNOSTIC;  Surgeon: Clovis Pu. Cornett, MD;  Location: [facility];  Service: General;  Laterality: N/A;   PELVIC LAPAROSCOPY     RADIOLOGY WITH ANESTHESIA N/A 12/16/2015   Procedure: MRI OF BRAIN WITH AND WITHOUT CONTRAST;  Surgeon: Medication Radiologist, MD;  Location: [facility];  Service: Radiology;  Laterality: N/A;   SHOULDER SURGERY  2007/2008   SPINE SURGERY  2010   cervical   Patient Active Problem List   Diagnosis Date Noted   GAD (generalized anxiety  disorder) 12/26/2017   OCD (obsessive compulsive disorder) 12/26/2017   PTSD (post-traumatic stress disorder) 12/26/2017   DDD (degenerative disc disease), cervical 07/14/2016   Primary osteoarthritis of both feet 07/14/2016   Primary osteoarthritis of both hands 07/14/2016   Other fatigue 07/14/2016   History of IBS 07/14/2016   Osteopenia of multiple sites 07/14/2016   Fibromyalgia 01/13/2016   MGUS (monoclonal gammopathy of unknown significance) 12/23/2013   Chronic cholecystitis without calculus 10/18/2012   Abdominal pain, unspecified site 10/18/2012   Nausea alone 10/18/2012   Unspecified constipation 10/18/2012   Depression 10/18/2012   Anxiety    ASCUS (atypical squamous cells of undetermined significance) on Pap smear    IUD    Hemorrhoids 11/29/2010   Abdominal pain, left upper quadrant 11/29/2010    PCP:  Lupita Raider, MD  REFERRING PROVIDER: Lupita Raider, MD  REFERRING DIAG: fibromyalgia, TMJ dysfunction (added 04/20/22)  THERAPY DIAG:  Abnormal  posture  Cramp and spasm  Muscle weakness (generalized)  Rationale for Evaluation and Treatment Rehabilitation  ONSET DATE: chronic pain (fibromyalgia) with flare-up 2 months ago  SUBJECTIVE:                                                                                                                                                                                                         SUBJECTIVE STATEMENT:  Just a lot to do, I think overall I am recovering better than I have previously. Probably bc I have been most consistent with my exercising. Today I feel tight all over, I know I am stressed and cannot relax my body or nerves.   PERTINENT HISTORY:  Anxiety, bipolar disorder, depression, fibromyalgia, IBS, migraines, osteopenia, PTSD  PAIN:  Are you having pain: Yes 4/10 Pain location: Lt scapula  TMJ 0/10- chewing sometimes just hurts  Pain description: sore, tight  Aggravating factors: doing too much Relieving factors: muscle relaxers, rest, stretching  PRECAUTIONS: Other: chronic pain syndrome and depression Other: slow progression with exercise due to chronic condition  WEIGHT BEARING RESTRICTIONS No  FALLS:  Has patient fallen in last 6 months? No  LIVING ENVIRONMENT: Lives with: lives with their family Lives in: House/apartment  OCCUPATION: on disability  PLOF: Independent with basic ADLs and Leisure: yardwork, walking  Pt cares for her 4 young grandchildren- difficulty with care including lifting and carrying  PATIENT GOALS be more active with less pain, exercise at the gym regularly, lift and carry grandchildren and laundry, sleep with fewer interruptions, get back to more gardening.  OBJECTIVE:   DIAGNOSTIC FINDINGS: none recent  PATIENT SURVEYS: Eval: Fibromyalgia Impact Questionnaire: first 2 sections only: 49 12/26/21: 40  02/28/22: 28 (first 2 sections only)  COGNITION: Overall cognitive status: Within functional limits for tasks  assessed  SENSATION: WFL  POSTURE: rounded shoulders and forward head  PALPATION: Diffuse palpable tenderness over bil neck, upper traps, thoracic, lumbar and gluteals with trigger points.    CERVICAL ROM:   Active ROM A/PROM (deg) eval A/ROM 12/26/21 A/ROM 04/20/22  Flexion 55    Extension 40    Right lateral flexion 30 35 40  Left lateral flexion 35 40 45  Right rotation 50 60 65  Left rotation 40 65 65   (Blank rows = not tested) TMJ: opening limited by 25%, clicking with opening on the Rt.  Palpable tenderness over Rt masseter and at TMJ UPPER EXTREMITY ROM: UE A/ROM is limited by 20% into flexion and abduction with pain at end range.  Hip flexibility is limited by 25% in all directions with pain in all directions.  UPPER EXTREMITY MMT: UE: 4+/5, LE 4+/5 except hip flexors 4-/5  TODAY'S TREATMENT:   07/12/22:Pt arrives for aquatic physical therapy. Treatment took place in 3.5-5.5 feet of water. Water temperature was 92 degrees F. Pt entered the pool via stairs reciprocally with mild use of rails. Pt requires buoyancy of water for support and to offload joints with strengthening exercises. Pt utilizes viscosity of the water required for strengthening. Seated water bench with 75% submersion Pt performed seated LE AROM exercises 20x in all planes,   Standing in 50%- 75% depth water pt performed water walking in all 4 directions 10x with ankle fins added for walking duration. VC for speed in order to generate appropriate current for resistance and/or UE movements. UE single buoy wts used for push/pull with UE. 50% depth standing: knee ext Bil 15x2 with small noodle, hip 3 ways with ankle fins 20x required some UE for balance today, core/lat press 20x with double buoy wts, 10 min Horizontal decompression float with 100% flotation and PTA providing lateral sway to mobilize trunk.    Date: 07/10/22 NuStep: Level 3 x 10 minutes for endurance and mobility-PT present to discuss  progress. Ball roll outs forward and lateral x10 each Seated on ball: 3 way raises with 1# weights x10 Standing rows and shoulder extension: red band  Trigger Point Dry-Needling  Treatment instructions: Expect mild to moderate muscle soreness. S/S of pneumothorax if dry needled over a lung field, and to seek immediate medical attention should they occur. Patient verbalized understanding of these instructions and education.  Patient Consent Given: Yes Education handout provided: Previously provided Muscles treated: Lt rhomboids, upper trap, Lt thoracic multifidi Treatment response/outcome: Utilized skilled palpation to identify trigger points.  During dry needling able to palpate muscle twitch and muscle elongation  Elongation and release to Lt rhomboids and upper traps  Skilled palpation and monitoring by PT during dry needling   07/05/22:Pt arrives for aquatic physical therapy. Treatment took place in 3.5-5.5 feet of water. Water temperature was 90 degrees F. Pt entered the pool via stairs reciprocally with mild use of rails. Pt requires buoyancy of water for support and to offload joints with strengthening exercises. Pt utilizes viscosity of the water required for strengthening. Seated water bench with 75% submersion Pt performed seated LE AROM exercises 20x in all planes,   Standing in 50%- 75% depth water pt performed water walking in all 4 directions 10x with ankle fins added for walking duration. VC for speed in order to  generate appropriate current for resistance and/or UE movements. UE single buoy wts used for push/pull with UE. 50% depth standing: knee ext Bil 15x2 with small noodle, single buoy UE wt push and hold with 6 lengths of high knee marching, hip 3 ways with ankle fins 20x required some UE for balance today, core/lat press 20x with double buoy wts, 10 min horseback bicycle on yellow noodle.   06/28/22:Pt arrives for aquatic physical therapy. Treatment took place in 3.5-5.5 feet of  water. Water temperature was 90 degrees F. Pt entered the pool via stairs reciprocally with mild use of rails. Pt requires buoyancy of water for support and to offload joints with strengthening exercises. Pt utilizes viscosity of the water required for strengthening. Seated water bench with 75% submersion Pt performed seated LE AROM exercises 20x in all planes,   Standing in 50%- 75% depth water pt performed water walking in all 4 directions 10x with ankle fins added for walking duration. VC for speed in order to generate appropriate current for resistance and/or UE movements. UE single buoy wts used for push/pull with UE. 50% depth standing: knee ext Bil 15x2 with small noodle, single buoy UE wt push and hold with 6 lengths of high knee marching, hip 3 ways with ankle fins 20x required some UE for balance today, core/lat press 20x with double buoy wts, 10 min horseback bicycle on yellow noodle.   HOME EXERCISE PROGRAM: Access Code: [email or web address] Access Code: [email or web address] URL: [email or web address] Date: 04/20/2022 Prepared by: Tresa Endo  Exercises - - Controlled Jaw Opening with Tongue on Roof of Mouth  - 3 x daily - 7 x weekly - 1 sets - 10 reps - Jaw Protraction  - 3 x daily - 7 x weekly - 1 sets - 10 reps ASSESSMENT:  CLINICAL IMPRESSION:  Pt arrives slightly stressed with a feeling of overall tightness to aquatics. Horizontal decompression with focused breathing helped tremendously with this.  OBJECTIVE IMPAIRMENTS decreased activity tolerance, decreased endurance, difficulty walking, decreased strength, increased muscle spasms, impaired flexibility, improper body mechanics, postural dysfunction, and pain.   ACTIVITY LIMITATIONS carrying, lifting, sitting, standing, reach over head, hygiene/grooming, and locomotion level  PARTICIPATION LIMITATIONS: meal prep, cleaning, laundry, driving, community activity, and yard work  PERSONAL FACTORS Past/current experiences, Time since onset of  injury/illness/exacerbation, and 3+ comorbidities: fibromyalgia depression, anxiety,   are also affecting patient's functional outcome.   REHAB POTENTIAL: Good  CLINICAL DECISION MAKING: Evolving/moderate complexity  EVALUATION COMPLEXITY: Moderate   GOALS: Goals reviewed with patient? Yes  SHORT TERM GOALS: Target date: 03/20/22  Return to regular performance of HEP 2-3x/day to improve mobility  Baseline: stretching 2x/day (02/28/22) Goal status: In progress  2.  Report > or = to 25% reduction in cervical and lumbar pain with ADLs and self-care  Baseline: 20% better overall (02/28/2022) Goal status: in progress   3.  Reduce fibromyalgia impact score to < or = to 39  Baseline: 28 (02/28/22) Goal status: MET    LONG TERM GOALS: Target date: 08/11/22  Be independent in advanced HEP Baseline: gentle exercise and able to progress depending on pain level (06/12/22) Goal status: In progress   2.  Reduce fibromyalgia impact questionnaire to < or = to 21 Baseline: 28 (02/28/22) Goal status: Revised   3.  Lift and carry laundry and her grandchildren with > or = to 50% reduction in pain Baseline: shoulder and neck 30%, low back 40% Goal status: In progress   4.  Report > or = to 60%  reduction in neck and lumbar pain with ADLs and self-care  Baseline: shoulder and neck 30%, low back 40% Goal status: in progress   5.  Return to yardwork verbalize appropriate rest breaks and mechanics  Baseline: has been able to do gentle things, will work to do more (06/12/22) Goal status: In progress    6. Report > or = to 30% reduction in Rt jaw pain with chewing and talking   Baseline  pain is resolved at this time (06/12/22)  Goal Status: MET PLAN: PT FREQUENCY: 2x/week  PT DURATION: 8 weeks  PLANNED INTERVENTIONS: Therapeutic exercises, Therapeutic activity, Neuromuscular re-education, Balance training, Gait training, Patient/Family education, Self Care, Joint mobilization, Aquatic Therapy, Dry  Needling, Spinal manipulation, Spinal mobilization, Cryotherapy, Moist heat, Taping, Traction, Manual therapy, and Re-evaluation  PLAN FOR NEXT SESSION: ERO coming up?  Ane Payment, PTA 07/12/22 5:59 PM    [facility] [address] [place], [place] [address] Phone # [phone] Fax [phone]

## 2022-07-12 ENCOUNTER — Ambulatory Visit: Payer: Medicare Other | Admitting: Physical Therapy

## 2022-07-12 ENCOUNTER — Encounter: Payer: Self-pay | Admitting: Physical Therapy

## 2022-07-12 DIAGNOSIS — M353 Polymyalgia rheumatica: Secondary | ICD-10-CM | POA: Diagnosis not present

## 2022-07-12 DIAGNOSIS — M6281 Muscle weakness (generalized): Secondary | ICD-10-CM | POA: Diagnosis not present

## 2022-07-12 DIAGNOSIS — M542 Cervicalgia: Secondary | ICD-10-CM | POA: Diagnosis not present

## 2022-07-12 DIAGNOSIS — R252 Cramp and spasm: Secondary | ICD-10-CM

## 2022-07-12 DIAGNOSIS — R293 Abnormal posture: Secondary | ICD-10-CM | POA: Diagnosis not present

## 2022-07-14 ENCOUNTER — Other Ambulatory Visit (HOSPITAL_BASED_OUTPATIENT_CLINIC_OR_DEPARTMENT_OTHER): Payer: Self-pay

## 2022-07-14 ENCOUNTER — Telehealth: Payer: Self-pay | Admitting: Psychiatry

## 2022-07-14 ENCOUNTER — Other Ambulatory Visit: Payer: Self-pay | Admitting: Psychiatry

## 2022-07-14 MED ORDER — LISDEXAMFETAMINE DIMESYLATE 50 MG PO CAPS
50.0000 mg | ORAL_CAPSULE | Freq: Every day | ORAL | 0 refills | Status: DC
  Filled 2022-07-14: qty 30, 30d supply, fill #0

## 2022-07-14 NOTE — Telephone Encounter (Signed)
Pt called and said that none of the pharmacues have generic vyvanse in stock. She needs a PA done for the brand name stating it is medically necessary. She can't afford $ 400 for this medication.

## 2022-07-17 ENCOUNTER — Other Ambulatory Visit: Payer: Self-pay

## 2022-07-17 ENCOUNTER — Other Ambulatory Visit (HOSPITAL_BASED_OUTPATIENT_CLINIC_OR_DEPARTMENT_OTHER): Payer: Self-pay

## 2022-07-17 NOTE — Telephone Encounter (Signed)
LVM to [facility]. Contacted pharmacy and Rx is being filled today.

## 2022-07-17 NOTE — Telephone Encounter (Signed)
Patient notified. She was "down east" visiting her [place], but will be home tomorrow and will pick up then.

## 2022-07-18 ENCOUNTER — Encounter: Payer: Self-pay | Admitting: Physical Therapy

## 2022-07-19 ENCOUNTER — Ambulatory Visit (INDEPENDENT_AMBULATORY_CARE_PROVIDER_SITE_OTHER): Payer: Medicare Other | Admitting: Psychology

## 2022-07-19 ENCOUNTER — Ambulatory Visit: Payer: Medicare Other | Admitting: Physical Therapy

## 2022-07-19 DIAGNOSIS — F411 Generalized anxiety disorder: Secondary | ICD-10-CM

## 2022-07-19 DIAGNOSIS — F902 Attention-deficit hyperactivity disorder, combined type: Secondary | ICD-10-CM | POA: Diagnosis not present

## 2022-07-19 DIAGNOSIS — F4310 Post-traumatic stress disorder, unspecified: Secondary | ICD-10-CM

## 2022-07-19 NOTE — Progress Notes (Signed)
[facility] Counselor/Therapist Progress Note  Patient ID: KAMALJIT SOLAR, MRN: [id number],    Date: 07/19/2022  Time Spent: 60 minutes  Treatment Type: Individual Therapy  Reported Symptoms: flashbacks, responding to triggers, depression, anxiety, being busy all of the time, dissociation  Mental Status Exam: Appearance:  Casual     Behavior: Appropriate  Motor: Restlestness  Speech/Language:  Clear and Coherent  Affect: Blunt  Mood: pleasant  Thought process: normal  Thought content:   WNL  Sensory/Perceptual disturbances:   WNL  Orientation: oriented to person, place, time/date, and situation  Attention: Good  Concentration: Good  Memory: WNL  Fund of knowledge:  Good  Insight:   Good  Judgment:  Fair  Impulse Control: Good   Risk Assessment: Danger to Self:  No Self-injurious Behavior: No Danger to Others: No Duty to Warn:no Physical Aggression / Violence:No  Access to Firearms a concern: No  Gang Involvement:No   Subjective: The patient attended a face-to-face individual therapy session via video visit.  The patient gave consent for the session to be on caregility.  The patient was in her daughter's home alone and the therapist was in the office.  The patient presents with a blunted affect and mood is pleasant.  She reports that she went down [place] to visit her daughter and grandchildren and has been there about a week.  We talked today about her tendency to take on guilt when something is not related to her.  We talked about this in regard to her mother.  Educated the patient on the tools of discipline to help her understand that I want her to work on acceptance of appropriate responsibility as opposed to taking on everything as her own and not letting other people be responsible for their behaviors.  We will continue to talk about these tools of responsibility during the next session. Interventions: Cognitive Behavioral Therapy, Mindfulness Meditation, Eye  Movement Desensitization and Reprocessing (EMDR), Insight-Oriented, and Interpersonal, psychoeducation  Diagnosis:Attention deficit hyperactivity disorder (ADHD), combined type  PTSD (post-traumatic stress disorder)  Generalized anxiety disorder  Plan: Plan of Care: Client Abilities/Strengths  Insightful, motivated, Supportive family Client Treatment Preferences  Outpatient Individual therapy/EMDR  Client Statement of Needs  "I think I have PTSD and I feel like I need another kind of therapy than talk therapy" Treatment Level  Outpatient Individual therapy  Symptoms  Demonstrates an exaggerated startle response.:  (Status: maintained). Depressed  or irritable mood.: (Status:maintained). Describes a reliving of the event,  particularly through dissociative flashbacks.: (Status: maintained). Displays a  significant decline in interest and engagement in activities.:  (Status: maintained). Displays significant psychological and/or physiological distress resulting from internal and external  clues that are reminiscent of the traumatic event.: (Status: maintained).  Experiences disturbances in sleep.:  (Status: maintained). Experiences disturbing  and persistent thoughts, images, and/or perceptions of the traumatic event.:  (Status: maintained). Experiences frequent nightmares.: (Status: maintained).  Feelings of hopelessness, worthlessness, or inappropriate guilt.: No Description Entered (Status:  improved). Has been exposed to a traumatic event involving actual or perceived threat of death or  serious injury.:  (Status: maintained). Impairment in social, occupational, or  other areas of functioning.:(Status: maintained). Intentionally avoids activities,  places, people, or objects (e.g., up-armored vehicles) that evoke memories of the event.: (Status: maintained). Intentionally avoids thoughts, feelings, or discussions related  to the traumatic event.: (Status: maintained). Reports  difficulty concentrating as  well as feelings of guilt (Status:maintained). Reports response of intense fear,  helplessness, or horror to the  traumatic event.:  (Status: maintained).  Problems Addressed  Unipolar Depression, Posttraumatic Stress Disorder (PTSD), Posttraumatic Stress Disorder (PTSD),   Posttraumatic Stress Disorder (PTSD), Posttraumatic Stress Disorder (PTSD)  Goals 1. Develop healthy thinking patterns and beliefs about self, others, and the world that lead to the alleviation and help prevent the relapse of  depression. Objective Identify and replace thoughts and beliefs that support depression. Target Date: 2023/01/04 Frequency: Weekly Progress: 0 Modality: individual Related Interventions 1. Explore and restructure underlying assumptions and beliefs reflected in biased self-talk that  may put the client at risk for relapse or recurrence. 2. Conduct Cognitive-Behavioral Therapy (see Cognitive Behavior Therapy by Reola Calkins; Overcoming Depression by Agapito Games al.), beginning with helping the client learn the connection among  cognition, depressive feelings, and actions. 2. Eliminate or reduce the negative impact trauma related symptoms have  on social, occupational, and family functioning. Objective Learn and implement personal skills to manage challenging situations related to trauma. Target Date: 2023/01/04 Frequency: Weekly Progress: 0 Modality: individual 3. No longer avoids persons, places, activities, and objects that are  reminiscent of the traumatic event. Objective Participate in Eye Movement Desensitization and Reprocessing (EMDR) to reduce emotional distress  related to traumatic thoughts, feelings, and images. Target Date: 2023/01/04 Frequency: Weekly Progress: 0 Modality: individual   Related Interventions 1. Utilize Eye Movement Desensitization and Reprocessing (EMDR) to reduce the client's  emotional reactivity to the traumatic event and reduce PTSD  symptoms. Objective Learn and implement guided self-dialogue to manage thoughts, feelings, and urges brought on by  encounters with trauma-related situations. Target Date: 2024-11/07 Frequency: Weekly Progress: 10 Modality: individual Related Interventions 1. Teach the client a guided self-dialogue procedure in which he/she learns to recognize  maladaptive self-talk, challenges its biases, copes with engendered feelings, overcomes  avoidance, and reinforces his/her accomplishments; review and reinforce progress, problemsolve obstacles. 4. No longer experiences intrusive event recollections, avoidance of event  reminders, intense arousal, or disinterest in activities or  relationships. 5. Thinks about or openly discusses the traumatic event with others  without experiencing psychological or physiological distress. Diagnosis Axis  none F43.10 (Posttraumatic stress disorder) - Open - [Signifier: n/a] Posttraumatic Stress  Disorder  Conditions For Discharge Achievement of treatment goals and objectives   Tyjah Hai G Channon Ambrosini, LCSW

## 2022-07-25 ENCOUNTER — Ambulatory Visit: Payer: Medicare Other | Admitting: Psychology

## 2022-07-26 ENCOUNTER — Ambulatory Visit: Payer: Medicare Other | Admitting: Physical Therapy

## 2022-07-27 ENCOUNTER — Telehealth (INDEPENDENT_AMBULATORY_CARE_PROVIDER_SITE_OTHER): Payer: Medicare Other | Admitting: Psychiatry

## 2022-07-27 ENCOUNTER — Ambulatory Visit: Payer: Medicare Other

## 2022-07-27 ENCOUNTER — Encounter: Payer: Self-pay | Admitting: Psychiatry

## 2022-07-27 DIAGNOSIS — R7989 Other specified abnormal findings of blood chemistry: Secondary | ICD-10-CM | POA: Diagnosis not present

## 2022-07-27 DIAGNOSIS — F902 Attention-deficit hyperactivity disorder, combined type: Secondary | ICD-10-CM | POA: Diagnosis not present

## 2022-07-27 DIAGNOSIS — F5081 Binge eating disorder: Secondary | ICD-10-CM

## 2022-07-27 DIAGNOSIS — F4310 Post-traumatic stress disorder, unspecified: Secondary | ICD-10-CM | POA: Diagnosis not present

## 2022-07-27 DIAGNOSIS — F4001 Agoraphobia with panic disorder: Secondary | ICD-10-CM

## 2022-07-27 DIAGNOSIS — F331 Major depressive disorder, recurrent, moderate: Secondary | ICD-10-CM

## 2022-07-27 DIAGNOSIS — F5105 Insomnia due to other mental disorder: Secondary | ICD-10-CM

## 2022-07-27 DIAGNOSIS — F50811 Binge eating disorder, moderate: Secondary | ICD-10-CM

## 2022-07-27 DIAGNOSIS — F428 Other obsessive-compulsive disorder: Secondary | ICD-10-CM | POA: Diagnosis not present

## 2022-07-27 DIAGNOSIS — F411 Generalized anxiety disorder: Secondary | ICD-10-CM

## 2022-07-27 NOTE — Progress Notes (Signed)
Danielle Bruce [id number] April 03, 1960 62 y.o.   Subjective:   Patient ID:  Danielle Bruce is a 62 y.o. (DOB 03-07-1960) female.  Chief Complaint:  Chief Complaint  Patient presents with   Follow-up   Depression   Anxiety   ADD    HPI Danielle Bruce presents for follow-up of multiple dxes.  visit in April and taken off lamotrigine DT possible skin reaction.  Had appt with [facility] dermatology and they assume that skin reaction with itching was related to lamotrigine. 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 Per oncology most likely drug is lamotrigine. And she weaned off of the medication to determine if the cutaneous lesions are related. Lesions seem less without lamotrigine.  At this point skin reaction is presumed related to lamotrigine.  No other disease sx. She is well aware that she has been on lamotrigine for many years with good results for depression control. There is a risk of relapse as she comes off of the medication and she is worried about that. Smart phone helps her cognitive  And other productivity.    seen October 2020.  Restarted low dosage ADD bc concerns over STM, concentration and productivity.  Ritalin 5 mg twice daily.  Also suggested she retry N-acetylcysteine for mild cognitive complaints.   seen April 07, 2019 the following was noted: Mostly good.  Anxiety is mostly better, but depression is a little worse seasonally.  Not able to go help her daughter.   Low energy but does have motivation.  Ritalin helps energy and focus but doesn't take it regularly bc irregular sleep cycle.   Ativan only when having medical issues.    Therapy working on trauma issues is very hard.  Struggling with the fact she doesn't have memory of large parts of her past.   No manic sx off mood stabilizers.  Covid isolation reduce stressors overall and not  prominently depressed.   No meds were changed.  June 25, 2019 appointment the following is noted: F real worried about Covid despite vaccination.   Started fluvoxamine 25 on May 14, 2019 from PCP mainly for inflammation but possibly thinking she might be depressed. Seen benefit for energy and sleep pattern and more appropriate feelings and less numb.  Now realizes she had been depressed for awhile but just numb with depression.  Anniversaries of deaths of friends including last BF [name] a little over a year ago.   Thinks of him daily. His 60 yo D died of unknown cause recently.  Stress D moved to beach. Wonders about increasing the fluvoxamine  Danielle Bruce living in [place] and hasn't seen him in a year. [name] on disability for depression. Patient reports more depression.  Plan: Realized lately she's depressed and started Luvox again for it and for the anti-inflammatory potential for her health.  Has seen some benefit.   Prior benefit at 25 mg daily.  Optiion increase but slightly bc med sensitive. AGREE increase fluvoxamine to 37.5 mg daily.  08/06/2019 appointment with the following noted: Increased fluvoxamine to 37.5 mg and it helped the depression. Could see a big difference and had been more depressed than she even realized. Outlook better and sleep pattern normalized.    More energy and motivation.   But also started having prolonged HA.  HA lasted a couple of weeks.   Assumed HA was DT increased fluvoxamine so reduced to 25 mg on  07/28/19.  07/29/19 Episode of confusion occurred also.  Went to doctor while confused.  They couldn't determine cause.    Resolved in 1 day. FM flare up since fall/winter. To beach with daughter 3 mos minimum to give her shots for IVF treatment. Patient reports panic recently and recent difficulty with anxiety.    Occ flashbacks.   Patient denies difficulty with sleep initiation or maintenance. Denies appetite disturbance.  Patient reports that energy and motivation  have been good.  Patient denies any difficulty with concentration.  Patient denies any suicidal ideation. Plan: AGREE increase fluvoxamine to 37.5 mg daily once HA is controlled for several weekcs.  01/01/20 appt with the following noted: Increased fluvoxamine for awhile but got more HA and reduced it. Anxiety is better in general now.  But is interested in increasing it DT coming winter. When anxiety is not good it catches her off guard. Likely had Covid in August but early test was negative.  Got prednisone but sick for 5 weeks.  Lost taste and smell still going on.  D also had Covid.  Felt really sick and terrified with bad mental health at the time.  Was traumatic for her.  Had been vaccinated.   Had panic attack at the ER and took a long time to get the Ativan.  D moving back to area and pt will have to help with childcare so hopes to more aggressively treat her health problems. 2 gkids and 2 more coming. Danielle Bruce married without kids yet. Plan: started Luvox again for it and for the anti-inflammatory potential for her health.  Has seen some benefit.   Prior benefit at 25 mg daily.  Optiion increase but slightly bc med sensitive. AGREE increase fluvoxamine to 37.5 mg daily once HA is controlled for several weeks.  03/03/2020 appointment with the following noted: On Day 10 of Covid.  2nd bout. A lot easier this time. Had gotten Luvox up to 50 mg daily and tolerated.  Then doubled it to 100 mg daily. Wonders about the proper dose for maintenance.   No taste and smell, exhausted, congestion and mild cough.  Energy is better than it was in the beginning.  Made her depressed and anxious again and that part is better also.  Last time had to go to ER with Covid.  Only ER visit in 2-3 years.  Less migraine than in the past. Overall was doing better with last couple months.  Trying to do anti-inflammatory diet.  Vegetarian, no dairy or gluten.  Natural sugars.   Still some anxiety. D moved back here and  pregnant with 2nd set of twins. Current twins are 60 yo.   Will restart trauma therapy and it's helped over the last couple of years.  More freedom from things.      Less daily dread with anxiety but still some panic which can be triggered.  Less OCD with fluvoxamine unless triggered.   Taking Ritalin 10 mg each AM and tolerating and benefiting. occ NM with few dreams overall. Plan: started Luvox again for it and for the anti-inflammatory potential for her health.  Has seen some benefit.   Prior benefit at 25 mg daily.  Med sensitive. AGREE increase fluvoxamine to  50  mg daily. Up to 100 mg daily if desired.   04/22/19 appt with following noted: Mostly good and randomly psycho.  Really busy with Judeth Cornfield [redacted] weeks pregnant with twins and twins 60 yo at home.  She's helping and her H is  away. Mo older and slower.  Pt overworking.  When with Judeth Cornfield kids interfere with sleep.  Doesn't eat as well with Judeth Cornfield affects her FM and energy.   Recently exhausted and needed rest but couldn't go home DT D had to go to hospital.  Her H critical at times and she lost it emotionally over this. Almost suicidal reaction with weird intrusive thoughts in response to the criticism.   Got a break and it helped.   Rheum adjusted Lyrica to BID. Had accident fall with one of babies and was bleeding.  She became hysterical.   Upset now that she couldn't control her emotions at the time.  Couldn't get herself under control to deal with the crisis.    Overall doing ok but doesn't handle stressors well and lose it.   Plan: AGREE with increase fluvoxamine to  50  mg daily. Up to 100 mg daily if desired.  06/23/2020 appointment with the following noted: Increased fluvoxamine 25 AM and 50 mg PM and tolerated it.  Been on this dose awhile.  Going to [place] May 6-19 and son taking her.  Working for [employer] and been successful. Also going to [place], [place].  Excited.   Occ overwhelmed. Starting therapy with Dorathy Daft and  looking forward to it and dealing with trauma stuff. Nervous about doing the therapy and getting decompensated like what happened last time. Prednisone hellps mood and body. Not as depressed as in the past.  Sleep is reasonably good.  Appetite is normal.  Anxiety intermittent.  No suicidal thoughts Plan: No med changes  08/19/2020 appointment with the following noted: Not good.  Dog died 2 week ago and was very close to her for 16 years.  She had been a great comfort during losses and health problems.  Dog helped her through periods of SI. Exhausted. Danielle Bruce in [place] and going in early July. Wonders how long this will last.  Also increased fluvoxamine and now out and having withdrawal. Disc difference between grief and depression. Chrissie Noa needs therapist who takes Medicaid. Plan: no med changes  01/10/21 appt noted: Had covid 3rd time. Still on fluvox 50, Ritalin 10 mg daily, Deplin, trazodone 50 mg HS. Helping with gkids and been sick a lot since September. Been doing so so overall.  At times is good. Son-in-law will be getting raise and D can quit work for a year or so. Danielle Bruce and his wife in [place] and that's good. Parents getting old. Both parents hearing problems and M more forgetful. Parents personality different.  Less therapy lately bc schedules.  Has dealt with a lot of trauma and will resume after new year and feels it has been helpful.  04/13/21 appt noted: More problems with appetite increase for a couple of years.  Always hungry.  Wonders about changing stimulant for this.  Weight up too much. Needs to lose 15+ # DT chronic pain including hips and knees. More depression.  Seasonally.  Episodic panic and anxiety chronic. Some intermittent conflict with daughter.   Needs Ativan intermittently. Still working through trauma issues. Continues therapy.   Haven't gotten back into church.  Not wanting to go out much. Plan: Benefit  Ritalin 10 mg AM and noon for cognitive and ADD  reasons and disc risk of palpitations.  Per request ok trial Vyvanse for binge eating and ADD instead of Ritalin per PCP suggestion May also help mood and weight loss should help pain. Caution it could cause anxiety  06/06/2021 appointment with the following  noted: Did get a prescription for Vyvanse 30 mg on 04/28/2021 filled.  Tried that in place of Ritalin. Better energy and focus with Vyvanse and less binge eating and lost 7-8 #.  Helped lose prednisone weight from November. Seems to wear off in 5-6 hours.   In afternoon when it wears off just wants to sit around.  No sig SE. Thinks she is still a little depressed and not enjoying things like she should. Chronic colitis with recent flare.   To [place] in May for a couple of weeks with son and his wife. Plan: Per request ok trial Vyvanse for binge eating and ADD and increase  to 40 mg daily per PCP suggestion  08/11/2021 appointment with the following noted: Tolerating increase Vyvanse 40 AM.  No SE.   Less crash with it and longer duration. Lost 14#.  Less craving.   Still kind of depressed.  B moved back after divorce.  F mad about it.  M health problems. Home more stressful.   Went to [place]. M acting odd and F cognitive problems. Poor sleep at home bc temp is so warm for parents. OC sx a little worse but not severe. Plan: Better with Vyvanse for binge eating and ADD and increase  to 40 mg daily per PCP suggestion May also help mood and weight loss should help pain. Continue fluvoxamine 50 mg twice daily  11/16/2021 appointment noted: Helping with gkids. Son in Social worker still working out of state.  F not doing well with cognition and falls. 62yo. Handling stressors pretty well.  When irritable then takes a break. Enjoys grandkids 4 of them. D therapist and not working right now other than taking care of kids. Restarted PT which has helped in the past with pain and limitations physically. Starting therapy with Merry Lofty end of  October. sTill taking vyvanse 40 and fluvoxamine 50 mg BID. Has missed some Vyvanse bc some days sleeping late. More productive with Vyvanse 40 mg AM, but still procrastinates. Can read again better and sustain attention better but not as productive as she would like. Still a fair amount of anxiety easily triggered anxiety at times and hopes therapy will help.  Claustrophobic easily with panic triggered. Depression is OK lately.  02/16/22 appt noted: Had asthma episode and had to leave.  She was not in severe distress and was able to verbalize that she could drive herself home.  04/26/22 appt noted: Doing well.  Overall.  If gets up late then skips vyvanse and then is sleepy.   Back and forth between [facility] and her house where temp is high for mother.  B at her house and always cold.  No HA with Stephanie's.   F 03-31-22 died with dementia.  Declined quickly.  Didn't have to be placed.  Has been ok.  Gkids talk about him.  M doing pretty well and grateful he passed before being placed.   Eating healthy.   Asks whether needs to continue fluvoxamine.   Still on fluvoxamine 50 BID, Vyvanse 40 AM, trazdone 50 MG Hs Mood is good. Counseling with Bambi Cottle is already helpful. Still some anxiety but getting better recognizing triggers.  Can catastrophixize and working on CBT.  Still episodes out of the blue. Energy good with Vyvanse. Sleep is ok at D's. Plan no med changes:  07/27/2022 appointment noted: Psych meds fluvoxamine 50 mg twice daily, Vyvanse 50 mg every morning, L-methylfolate 15 mg daily, lorazepam 1 mg as needed panic, Lyrica 50 mg  twice daily.  Trazodone 50 mg nightly as needed insomnia. No SE.   Vyvanse helps focus, binge eating .  Was out 5 days and overate.  More productive with it.  Better socialization with it.  Lost wt on it. Dose seems right . Sick with URI. Otherwise doing well.   D moved back [place] .  Had "breakdown" with conflict with D with thoughts of  wanting to escape but came out of it.  Therapist Bambi Cottle helped her.  Less often panic but has them at times.  Therapist helping her to recognize triggers.  Dep is under control.  Not desperate. Will have flareup with FM.  But has been active with gkids. When with kids sleeps well.    Past psych meds: Lithium, carbamazepine, oxcarbazepine, topiramate, valproic acid, lamotrigine, gabapentin  Zyprexa, Seroquel, Latuda headaches, Geodon,  Risperidone, Stelazine,  Rexulti,  Wellbutrin with side effects,  sertraline irritability, duloxetine,  fluvoxamine 100, fluoxetine, Stopped desipramine DT skin issues which now resolved.  It helped GI px.  buspirone side effects,   N-acetylcysteine,  pramipexole,  methylphenidate,  Adderall,  Vyvanse 50 no SE, helps Ambien with amnesia, trazodone, Ativan  Review of Systems:  Review of Systems  HENT:  Negative for congestion.   Gastrointestinal:  Positive for abdominal pain. Negative for nausea.  Musculoskeletal:  Positive for back pain and myalgias.  Neurological:  Positive for headaches. Negative for tremors.  Psychiatric/Behavioral:  Negative for dysphoric mood. The patient is nervous/anxious.   Less HA with new meds.  Medications: I have reviewed the patient's current medications.  Current Outpatient Medications  Medication Sig Dispense Refill   albuterol (PROVENTIL HFA;VENTOLIN HFA) 108 (90 Base) MCG/ACT inhaler Inhale 2 puffs into the lungs every 6 (six) hours as needed for wheezing or shortness of breath.     Calcium Carb-Cholecalciferol (CALCIUM 1000 + D) 1000-20 MG-MCG TABS Take by mouth.     cyclobenzaprine (FLEXERIL) 10 MG tablet Take 10 mg by mouth at bedtime as needed.     diphenhydrAMINE (BENADRYL) 25 mg capsule Take 50 mg by mouth every 6 (six) hours as needed (HEADACHES).     Erenumab-aooe 70 MG/ML SOAJ Inject into the skin every 28 (twenty-eight) days.     fluvoxaMINE (LUVOX) 50 MG tablet TAKE 1 TABLET(50 MG) BY MOUTH TWICE  DAILY 180 tablet 1   hydrocortisone (ANUSOL-HC) 25 MG suppository 1 suppository     ibuprofen (ADVIL) 800 MG tablet 1 tablet with food or milk as needed     L-Methylfolate 15 MG TABS TAKE 1 TABLET BY MOUTH DAILY 30 tablet 11   levocetirizine (XYZAL) 5 MG tablet Take 5 mg by mouth every evening.  5   [START ON 09/04/2022] lisdexamfetamine (VYVANSE) 50 MG capsule Take 1 capsule (50 mg total) by mouth daily. 30 capsule 0   [START ON 08/07/2022] lisdexamfetamine (VYVANSE) 50 MG capsule Take 1 capsule (50 mg total) by mouth daily. 30 capsule 0   lisdexamfetamine (VYVANSE) 50 MG capsule Take 1 capsule (50 mg total) by mouth daily. 30 capsule 0   LORazepam (ATIVAN) 1 MG tablet Take 1 tablet (1 mg total) by mouth every 8 (eight) hours as needed for anxiety. (takes 2 mg when having a medical procedure.) 30 tablet 1   methocarbamol (ROBAXIN) 500 MG tablet Take 1 tablet (500 mg total) by mouth daily as needed for muscle spasms. 30 tablet 1   montelukast (SINGULAIR) 10 MG tablet Take 10 mg by mouth at bedtime.     NURTEC  75 MG TBDP Take 1 tablet by mouth daily as needed.     oxyCODONE-acetaminophen (PERCOCET/ROXICET) 5-325 MG per tablet Take 2 tablets by mouth daily as needed for moderate pain or severe pain (mighraine.).      polyethylene glycol (MIRALAX / GLYCOLAX) packet Take 17 g by mouth daily as needed for mild constipation.     pravastatin (PRAVACHOL) 40 MG tablet 1 tablet     pregabalin (LYRICA) 50 MG capsule Take 100 mg by mouth 3 (three) times daily.     PROLIA 60 MG/ML SOSY injection BRING TO THE OFFICE FOR INJECTION ON 02/15/2018 AS DIRECTED ONCE EVERY 6 MONTHS     promethazine (PHENERGAN) 25 MG tablet Take 25 mg by mouth every 6 (six) hours as needed for nausea.     traZODone (DESYREL) 50 MG tablet TAKE 1 TABLET(50 MG) BY MOUTH AT BEDTIME 90 tablet 1   VASCEPA 1 g CAPS TK 2 CS PO BID WC  3   VITAMIN D PO Take by mouth. Take 5000mg  daily     VOLTAREN 1 % GEL Apply 2 g topically 4 (four) times daily  as needed (JOINT PAIN).   3   Wheat Dextrin (BENEFIBER PO) 1 Tablespoon     No current facility-administered medications for this visit.    Medication Side Effects: None  Allergies:  Allergies  Allergen Reactions   Sulfa Antibiotics Nausea And Vomiting    Causes pt to vomit blood Other reaction(s): vomiting blood   Gluten Meal Other (See Comments)    Sensitivity.    Lactose Intolerance (Gi) Nausea And Vomiting   Aspirin     Other reaction(s): colitis   Bromfed Dm [Pseudoeph-Bromphen-Dm]     Other reaction(s): strung out and hallucinating   Chlorpromazine     Other reaction(s): Dark thoughts   Desipramine     Other reaction(s): rash   Gabapentin     Other reaction(s): swelling / fuzzy vision   Hydrocodone     Other reaction(s): causes hangovers   Lamotrigine     Other reaction(s): rash   Metronidazole     Other reaction(s): vomiting blood   Morphine Sulfate     Other reaction(s): tearful   Nsaids     Other reaction(s): colitis   Flagyl [Metronidazole Hcl] Nausea And Vomiting   Morphine And Codeine Other (See Comments)    Reaction: Depression, emotional    Past Medical History:  Diagnosis Date   Abdominal pain, unspecified site 10/18/2012   Anemia    Anxiety    Arthritis    osteoarthritis   ASCUS (atypical squamous cells of undetermined significance) on Pap smear 05/06/2005   NEG HIGH RISK HPV--C&B BIOPSY BENIGN 12/2005   Asthma    Bipolar 2 disorder (HCC)    Cancer (HCC)    skin cancer - basal cell   Colon polyps    Complication of anesthesia    anxious afterwards, will get headaches    Constipation    Depression    Fibromyalgia 10/2013   GERD (gastroesophageal reflux disease)    Hearing loss on left    Heart murmur    never had any problems   Hemorrhoids    High cholesterol    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   cytology negative   IBS (irritable bowel syndrome)    Insomnia    Lymphocytic colitis    Lymphoma (HCC)    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   Bone marrow biopsy showes 8% plasma cells IgA  Lambda   Migraines    Nausea alone 10/18/2012   Osteoarthritis    Osteopenia    Peripheral neuropathy    PTSD (post-traumatic stress disorder)     Family History  Problem Relation Age of Onset   Diabetes Father    Hypertension Father    Hyperlipidemia Father    Depression Daughter    Anxiety disorder Daughter    Asthma Daughter    Heart disease Maternal Grandfather    Heart disease Paternal Grandmother    Heart disease Paternal Grandfather    Depression Son    Depression Son    Anxiety disorder Son    Breast cancer Neg Hx     Social History   Socioeconomic History   Marital status: Divorced    Spouse name: Not on file   Number of children: 3   Years of education: Not on file   Highest education level: Not on file  Occupational History   Not on file  Tobacco Use   Smoking status: Former    Types: Cigarettes    Quit date: 05/13/2014    Years since quitting: 8.2   Smokeless tobacco: Never   Tobacco comments:    social   Vaping Use   Vaping Use: Never used  Substance and Sexual Activity   Alcohol use: Yes    Alcohol/week: 0.0 standard drinks of alcohol    Comment: rare   Drug use: Never   Sexual activity: Not Currently    Comment: -1st intercourse 62 yo-More than 5 partners  Other Topics Concern   Not on file  Social History Narrative   Not on file   Social Determinants of Health   Financial Resource Strain: Not on file  Food Insecurity: Not on file  Transportation Needs: Not on file  Physical Activity: Not on file  Stress: Not on file  Social Connections: Not on file  Intimate Partner Violence: Not on file    Past Medical History, Surgical history, Social history, and Family history were reviewed and updated as appropriate.   Please see review of systems for further details on the patient's review from today.   Objective:   Physical Exam:  LMP 12/28/2010    Physical Exam Constitutional:      General: She is not in acute distress. Musculoskeletal:        General: No deformity.  Neurological:     Mental Status: She is alert and oriented to person, place, and time.     Coordination: Coordination normal.  Psychiatric:        Attention and Perception: Attention and perception normal. She does not perceive auditory or visual hallucinations.        Mood and Affect: Mood is anxious. Mood is not depressed. Affect is not labile, angry, tearful or inappropriate.        Speech: Speech normal.        Behavior: Behavior normal.        Thought Content: Thought content normal. Thought content is not paranoid or delusional. Thought content does not include homicidal or suicidal ideation. Thought content does not include suicidal plan.        Cognition and Memory: Cognition and memory normal.        Judgment: Judgment normal.     Comments: Insight intact Depression  better overall Affect better      Lab Review:     Component Value Date/Time   NA 142 08/01/2016 1713   NA 144 07/12/2015 1521   K  4.7 08/01/2016 1713   K 3.8 07/12/2015 1521   CL 107 08/01/2016 1713   CO2 23 08/01/2016 1713   CO2 27 07/12/2015 1521   GLUCOSE 95 08/01/2016 1713   GLUCOSE 82 07/12/2015 1521   BUN 14 08/01/2016 1713   BUN 10.3 07/12/2015 1521   CREATININE 0.87 08/01/2016 1713   CREATININE 0.8 07/12/2015 1521   CALCIUM 10.0 08/01/2016 1713   CALCIUM 9.9 07/12/2015 1521   PROT 7.4 08/01/2016 1713   PROT 7.4 07/12/2015 1521   ALBUMIN 4.8 08/01/2016 1713   ALBUMIN 4.4 07/12/2015 1521   AST 23 08/01/2016 1713   AST 22 07/12/2015 1521   ALT 26 08/01/2016 1713   ALT 31 07/12/2015 1521   ALKPHOS 55 08/01/2016 1713   ALKPHOS 54 07/12/2015 1521   BILITOT 0.7 08/01/2016 1713   BILITOT 0.40 07/12/2015 1521   GFRNONAA 76 08/01/2016 1713   GFRAA 88 08/01/2016 1713       Component Value Date/Time   WBC 5.9 08/01/2016 1713   RBC 4.71 08/01/2016 1713   HGB 14.6  08/01/2016 1713   HGB 14.1 07/12/2015 1521   HCT 43.1 08/01/2016 1713   HCT 41.5 07/12/2015 1521   PLT 254 08/01/2016 1713   PLT 248 07/12/2015 1521   MCV 91.5 08/01/2016 1713   MCV 92.2 07/12/2015 1521   MCH 31.0 08/01/2016 1713   MCHC 33.9 08/01/2016 1713   RDW 13.2 08/01/2016 1713   RDW 13.3 07/12/2015 1521   LYMPHSABS 2,006 08/01/2016 1713   LYMPHSABS 1.8 07/12/2015 1521   MONOABS 354 08/01/2016 1713   MONOABS 0.7 07/12/2015 1521   EOSABS 118 08/01/2016 1713   EOSABS 0.1 07/12/2015 1521   BASOSABS 0 08/01/2016 1713   BASOSABS 0.0 07/12/2015 1521    No results found for: "POCLITH", "LITHIUM"   No results found for: "PHENYTOIN", "PHENOBARB", "VALPROATE", "CBMZ"   .res Assessment: Plan:    Maritta was seen today for follow-up, depression, anxiety and add.  Diagnoses and all orders for this visit:  Major depressive disorder, recurrent episode, moderate (HCC)  PTSD (post-traumatic stress disorder)  Generalized anxiety disorder  Obsessional thoughts  Attention deficit hyperactivity disorder (ADHD), combined type  Binge-eating disorder, moderate  Panic disorder with agoraphobia  Insomnia due to mental condition  Low vitamin D level   Past couple of years more obvious PTSD obvious has called into doubt the bipolar dix bc she's remained mood stable off the lamotrigine.    She has had symptoms consistent with hypomania in the past but there is now some question as to whether they were PTSD related rather than truly bipolar 2.  Her son has had treatment resistant depression with possible bipolar disorder and her daughter has been diagnosed with bipolar disorder but also a history of borderline personality disorder.  So there is a family history of the spectrum.  Has seen some benefit.   Prior benefit fluvoxamine for anxiety and mood disorders. Continue fluvoxamine 50 mg BID Clear benefit from fluvoxamine for mood quite significantly. No manic sx. Consider weaning when  therapy has helped a little more and further progress.  HA are better.   Disc sleep hygiene and temperature.    CBT around agoraphobia.  Need to push herself to get out more.  She plans to continue with Bambi Cottle.  Pending EMDR.   We discussed the short-term risks associated with benzodiazepines including sedation and increased fall risk among others.  Discussed long-term side effect risk including dependence, potential withdrawal symptoms, and  the potential eventual dose-related risk of dementia.  But recent studies from 2020 dispute this association between benzodiazepines and dementia risk. Newer studies in 2020 do not support an association with dementia.   Keep this at a minimum given cognitive concerns.  She only uses it when triggered with anxiety usually medical.  She is not using it regularly. Continue Ativan prn 1 mg for panic and triggers.  Discussed potential benefits, risks, and side effects of stimulants with patient to include increased heart rate, palpitations, insomnia, increased anxiety, increased irritability, or decreased appetite.  Instructed patient to contact office if experiencing any significant tolerability issues.  Better with Vyvanse for binge eating and ADD at 50 mg daily per PCP suggestion May also help mood and weight loss should help pain.  Has been helpful.   Caution it could cause anxiety and other SE Disc pending generic.  Disc dosing options and risk of skipping higher doses will be more noticeably problematic.  No med changes indicated.  Continue: fluvoxamine 50 mg twice daily, Vyvanse 50 mg every morning, L-methylfolate 15 mg daily, lorazepam 1 mg as needed panic, Lyrica 50 mg twice daily.  Trazodone 50 mg nightly as needed insomnia.  FU 3 mos  Meredith Staggers, MD, DFAPA    Please see After Visit Summary for patient specific instructions.  Future Appointments  Date Time Provider Department Center  07/31/2022  2:00 PM Edrick Oh, PT [facility]  None  08/02/2022 11:45 AM Manfred Shirts, PTA [facility] None  08/02/2022  1:00 PM Cottle, Bambi G, LCSW [facility] None  08/07/2022 10:15 AM Edrick Oh, PT [facility] None  08/08/2022  2:00 PM Cottle, Bambi G, LCSW [facility] None  08/09/2022 11:45 AM Ane Payment L, PTA [facility] None  08/15/2022  3:00 PM Cottle, Bambi G, LCSW [facility] None  08/22/2022  3:00 PM Cottle, Bambi G, LCSW [facility] None  08/29/2022  3:00 PM Cottle, Bambi G, LCSW [facility] None  09/05/2022  3:00 PM Cottle, Bambi G, LCSW [facility] None  09/12/2022  3:00 PM Cottle, Bambi G, LCSW [facility] None  09/19/2022  3:00 PM Cottle, Bambi G, LCSW [facility] None  09/26/2022  3:00 PM Cottle, Bambi G, LCSW [facility] None    No orders of the defined types were placed in this encounter.      -------------------------------

## 2022-07-28 DIAGNOSIS — G43909 Migraine, unspecified, not intractable, without status migrainosus: Secondary | ICD-10-CM | POA: Diagnosis not present

## 2022-07-28 DIAGNOSIS — M797 Fibromyalgia: Secondary | ICD-10-CM | POA: Diagnosis not present

## 2022-07-28 DIAGNOSIS — J40 Bronchitis, not specified as acute or chronic: Secondary | ICD-10-CM | POA: Diagnosis not present

## 2022-07-28 DIAGNOSIS — F411 Generalized anxiety disorder: Secondary | ICD-10-CM | POA: Diagnosis not present

## 2022-07-28 DIAGNOSIS — E7800 Pure hypercholesterolemia, unspecified: Secondary | ICD-10-CM | POA: Diagnosis not present

## 2022-07-28 DIAGNOSIS — F4310 Post-traumatic stress disorder, unspecified: Secondary | ICD-10-CM | POA: Diagnosis not present

## 2022-07-31 ENCOUNTER — Ambulatory Visit: Payer: Medicare Other | Attending: Family Medicine

## 2022-07-31 DIAGNOSIS — R252 Cramp and spasm: Secondary | ICD-10-CM | POA: Diagnosis not present

## 2022-07-31 DIAGNOSIS — M353 Polymyalgia rheumatica: Secondary | ICD-10-CM | POA: Diagnosis not present

## 2022-07-31 DIAGNOSIS — M6281 Muscle weakness (generalized): Secondary | ICD-10-CM | POA: Insufficient documentation

## 2022-07-31 DIAGNOSIS — M542 Cervicalgia: Secondary | ICD-10-CM | POA: Diagnosis not present

## 2022-07-31 DIAGNOSIS — R293 Abnormal posture: Secondary | ICD-10-CM | POA: Diagnosis not present

## 2022-07-31 NOTE — Therapy (Signed)
OUTPATIENT PHYSICAL THERAPY TREATMENT   Patient Name: Danielle Bruce MRN: [id number] DOB:[birth year], 62 y.o., female Today's Date: 07/31/2022       PT End of Session - 07/31/22 1443     Visit Number 46    Date for PT Re-Evaluation 08/11/22    Authorization Type Medicare B- KX now    Progress Note Due on Visit 48    PT Start Time 1407    PT Stop Time 1444    PT Time Calculation (min) 37 min    Activity Tolerance Patient tolerated treatment well    Behavior During Therapy [facility] for tasks assessed/performed                                                 Past Medical History:  Diagnosis Date   Abdominal pain, unspecified site 10/18/2012   Anemia    Anxiety    Arthritis    osteoarthritis   ASCUS (atypical squamous cells of undetermined significance) on Pap smear 05/06/2005   NEG HIGH RISK HPV--C&B BIOPSY BENIGN 12/2005   Asthma    Bipolar 2 disorder (HCC)    Cancer (HCC)    skin cancer - basal cell   Colon polyps    Complication of anesthesia    anxious afterwards, will get headaches    Constipation    Depression    Fibromyalgia 10/2013   GERD (gastroesophageal reflux disease)    Hearing loss on left    Heart murmur    never had any problems   Hemorrhoids    High cholesterol    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   cytology negative   IBS (irritable bowel syndrome)    Insomnia    Lymphocytic colitis    Lymphoma (HCC)    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   Bone marrow biopsy showes 8% plasma cells IgA Lambda   Migraines    Nausea alone 10/18/2012   Osteoarthritis    Osteopenia    Peripheral neuropathy    PTSD (post-traumatic stress disorder)    Past Surgical History:  Procedure Laterality Date   BONE MARROW BIOPSY Left 12/18/2013   Plasma cell dyscrasia 8% population of plasma cells   BREAST BIOPSY Right    benign stereo   CESAREAN SECTION  40,98,11   CHOLECYSTECTOMY N/A 10/16/2012    Procedure: LAPAROSCOPIC CHOLECYSTECTOMY;  Surgeon: Clovis Pu. Cornett, MD;  Location: [facility];  Service: General;  Laterality: N/A;   COLONOSCOPY     numerous times   DILATION AND CURETTAGE OF UTERUS     ESOPHAGOGASTRODUODENOSCOPY     HEMORRHOID SURGERY  1993   x3   IUD REMOVAL  02/2015   Mirena   LAPAROSCOPIC LYSIS OF ADHESIONS N/A 10/16/2012   Procedure: LAPAROSCOPIC LYSIS OF ADHESIONS;  Surgeon: Maisie Fus A. Cornett, MD;  Location: [facility];  Service: General;  Laterality: N/A;   LAPAROSCOPY N/A 10/16/2012   Procedure: LAPAROSCOPY DIAGNOSTIC;  Surgeon: Clovis Pu. Cornett, MD;  Location: [facility];  Service: General;  Laterality: N/A;   PELVIC LAPAROSCOPY     RADIOLOGY WITH ANESTHESIA N/A 12/16/2015   Procedure: MRI OF BRAIN WITH AND WITHOUT CONTRAST;  Surgeon: Medication Radiologist, MD;  Location: [facility];  Service: Radiology;  Laterality: N/A;   SHOULDER SURGERY  2007/2008   SPINE SURGERY  2010   cervical   Patient Active Problem  List   Diagnosis Date Noted   GAD (generalized anxiety disorder) 12/26/2017   OCD (obsessive compulsive disorder) 12/26/2017   PTSD (post-traumatic stress disorder) 12/26/2017   DDD (degenerative disc disease), cervical 07/14/2016   Primary osteoarthritis of both feet 07/14/2016   Primary osteoarthritis of both hands 07/14/2016   Other fatigue 07/14/2016   History of IBS 07/14/2016   Osteopenia of multiple sites 07/14/2016   Fibromyalgia 01/13/2016   MGUS (monoclonal gammopathy of unknown significance) 12/23/2013   Chronic cholecystitis without calculus 10/18/2012   Abdominal pain, unspecified site 10/18/2012   Nausea alone 10/18/2012   Unspecified constipation 10/18/2012   Depression 10/18/2012   Anxiety    ASCUS (atypical squamous cells of undetermined significance) on Pap smear    IUD    Hemorrhoids 11/29/2010   Abdominal pain, left upper quadrant 11/29/2010    PCP:  Lupita Raider, MD  REFERRING PROVIDER: Lupita Raider, MD  REFERRING DIAG:  fibromyalgia, TMJ dysfunction (added 04/20/22)  THERAPY DIAG:  Abnormal posture  Cramp and spasm  Muscle weakness (generalized)  Polymyalgia rheumatica (HCC)  Rationale for Evaluation and Treatment Rehabilitation  ONSET DATE: chronic pain (fibromyalgia) with flare-up 2 months ago  SUBJECTIVE:                                                                                                                                                                                                         SUBJECTIVE STATEMENT:  I was sick for a couple of weeks and out of town with my daughter's house.  I did a lot of yard work and my Lt scapula is aggravated.  I know what to do when I am hurting.    PERTINENT HISTORY:  Anxiety, bipolar disorder, depression, fibromyalgia, IBS, migraines, osteopenia, PTSD  PAIN:  Are you having pain: Yes 3-4/10 Pain location: Lt scapula  TMJ 0/10- chewing sometimes just hurts  Pain description: irritating  Aggravating factors: doing too much Relieving factors: muscle relaxers, rest, stretching  PRECAUTIONS: Other: chronic pain syndrome and depression Other: slow progression with exercise due to chronic condition  WEIGHT BEARING RESTRICTIONS No  FALLS:  Has patient fallen in last 6 months? No  LIVING ENVIRONMENT: Lives with: lives with their family Lives in: House/apartment  OCCUPATION: on disability  PLOF: Independent with basic ADLs and Leisure: yardwork, walking  Pt cares for her 4 young grandchildren- difficulty with care including lifting and carrying  PATIENT GOALS be more active with less pain, exercise at the gym regularly, lift and carry grandchildren and laundry, sleep with fewer interruptions, get back to more  gardening.  OBJECTIVE:   DIAGNOSTIC FINDINGS: none recent   PATIENT SURVEYS: Eval: Fibromyalgia Impact Questionnaire: first 2 sections only: 49 12/26/21: 40  02/28/22: 28 (first 2 sections only)  COGNITION: Overall cognitive  status: Within functional limits for tasks assessed  SENSATION: WFL  POSTURE: rounded shoulders and forward head  PALPATION: Diffuse palpable tenderness over bil neck, upper traps, thoracic, lumbar and gluteals with trigger points.    CERVICAL ROM:   Active ROM A/PROM (deg) eval A/ROM 12/26/21 A/ROM 04/20/22  Flexion 55    Extension 40    Right lateral flexion 30 35 40  Left lateral flexion 35 40 45  Right rotation 50 60 65  Left rotation 40 65 65   (Blank rows = not tested) TMJ: opening limited by 25%, clicking with opening on the Rt.  Palpable tenderness over Rt masseter and at TMJ UPPER EXTREMITY ROM: UE A/ROM is limited by 20% into flexion and abduction with pain at end range.  Hip flexibility is limited by 25% in all directions with pain in all directions.  UPPER EXTREMITY MMT: UE: 4+/5, LE 4+/5 except hip flexors 4-/5  TODAY'S TREATMENT:  Date: 07/31/22 NuStep: Level 3 x 10 minutes for endurance and mobility-PT present to discuss progress. Ball roll outs forward and lateral x10 each Seated on ball: 3 way raises with 2# weights x10 Standing rows and shoulder extension: red band  Trigger Point Dry-Needling  Treatment instructions: Expect mild to moderate muscle soreness. S/S of pneumothorax if dry needled over a lung field, and to seek immediate medical attention should they occur. Patient verbalized understanding of these instructions and education.  Patient Consent Given: Yes Education handout provided: Previously provided Muscles treated: Lt rhomboids, upper trap, Lt thoracic multifidi Treatment response/outcome: Utilized skilled palpation to identify trigger points.  During dry needling able to palpate muscle twitch and muscle elongation  Elongation and release to Lt rhomboids and upper traps  Skilled palpation and monitoring by PT during dry needling  07/12/22:Pt arrives for aquatic physical therapy. Treatment took place in 3.5-5.5 feet of water. Water temperature  was 92 degrees F. Pt entered the pool via stairs reciprocally with mild use of rails. Pt requires buoyancy of water for support and to offload joints with strengthening exercises. Pt utilizes viscosity of the water required for strengthening. Seated water bench with 75% submersion Pt performed seated LE AROM exercises 20x in all planes,   Standing in 50%- 75% depth water pt performed water walking in all 4 directions 10x with ankle fins added for walking duration. VC for speed in order to generate appropriate current for resistance and/or UE movements. UE single buoy wts used for push/pull with UE. 50% depth standing: knee ext Bil 15x2 with small noodle, hip 3 ways with ankle fins 20x required some UE for balance today, core/lat press 20x with double buoy wts, 10 min Horizontal decompression float with 100% flotation and PTA providing lateral sway to mobilize trunk.    Date: 07/10/22 NuStep: Level 3 x 10 minutes for endurance and mobility-PT present to discuss progress. Ball roll outs forward and lateral x10 each Seated on ball: 3 way raises with 1# weights x10 Standing rows and shoulder extension: red band  Trigger Point Dry-Needling  Treatment instructions: Expect mild to moderate muscle soreness. S/S of pneumothorax if dry needled over a lung field, and to seek immediate medical attention should they occur. Patient verbalized understanding of these instructions and education.  Patient Consent Given: Yes Education handout provided: Previously provided Muscles  treated: Lt rhomboids, upper trap, Lt thoracic multifidi Treatment response/outcome: Utilized skilled palpation to identify trigger points.  During dry needling able to palpate muscle twitch and muscle elongation  Elongation and release to Lt rhomboids and upper traps  Skilled palpation and monitoring by PT during dry needling   HOME EXERCISE PROGRAM: Access Code: [email or web address] Access Code: [email or web address] URL:  [email or web address] Date: 04/20/2022 Prepared by: Tresa Endo  Exercises - - Controlled Jaw Opening with Tongue on Roof of Mouth  - 3 x daily - 7 x weekly - 1 sets - 10 reps - Jaw Protraction  - 3 x daily - 7 x weekly - 1 sets - 10 reps ASSESSMENT:  CLINICAL IMPRESSION:  Pt with lapse in treatment due to travel and illness.  She reports that she is able to utilize strategies when she is having pain that helps her.  She was able to work out in the yard yesterday and has some Lt scapular pain.  She responded well to DN with improved tissue mobility and twitch response with DN today.  Pt tolerated 2# weights for 3 way raises today.  Pt continues to benefit from gentle strength progression and aquatics to manage chronic condition.  Patient will benefit from skilled PT to address the below impairments and improve overall function.   OBJECTIVE IMPAIRMENTS decreased activity tolerance, decreased endurance, difficulty walking, decreased strength, increased muscle spasms, impaired flexibility, improper body mechanics, postural dysfunction, and pain.   ACTIVITY LIMITATIONS carrying, lifting, sitting, standing, reach over head, hygiene/grooming, and locomotion level  PARTICIPATION LIMITATIONS: meal prep, cleaning, laundry, driving, community activity, and yard work  PERSONAL FACTORS Past/current experiences, Time since onset of injury/illness/exacerbation, and 3+ comorbidities: fibromyalgia depression, anxiety,   are also affecting patient's functional outcome.   REHAB POTENTIAL: Good  CLINICAL DECISION MAKING: Evolving/moderate complexity  EVALUATION COMPLEXITY: Moderate   GOALS: Goals reviewed with patient? Yes  SHORT TERM GOALS: Target date: 03/20/22  Return to regular performance of HEP 2-3x/day to improve mobility  Baseline: stretching 2x/day (02/28/22) Goal status: In progress  2.  Report > or = to 25% reduction in cervical and lumbar pain with ADLs and self-care  Baseline: 20%  better overall (02/28/2022) Goal status: in progress   3.  Reduce fibromyalgia impact score to < or = to 39  Baseline: 28 (02/28/22) Goal status: MET    LONG TERM GOALS: Target date: 08/11/22  Be independent in advanced HEP Baseline: gentle exercise and able to progress depending on pain level (06/12/22) Goal status: In progress   2.  Reduce fibromyalgia impact questionnaire to < or = to 21 Baseline: 28 (02/28/22) Goal status: Revised   3.  Lift and carry laundry and her grandchildren with > or = to 50% reduction in pain Baseline: shoulder and neck 30%, low back 40% Goal status: In progress   4.  Report > or = to 60% reduction in neck and lumbar pain with ADLs and self-care  Baseline: shoulder and neck 30%, low back 40% Goal status: in progress   5.  Return to yardwork verbalize appropriate rest breaks and mechanics  Baseline: has been able to do gentle things, will work to do more (06/12/22) Goal status: In progress    6. Report > or = to 30% reduction in Rt jaw pain with chewing and talking   Baseline  pain is resolved at this time (06/12/22)  Goal Status: MET PLAN: PT FREQUENCY: 2x/week  PT DURATION: 8 weeks  PLANNED INTERVENTIONS: Therapeutic exercises, Therapeutic activity, Neuromuscular re-education, Balance  training, Gait training, Patient/Family education, Self Care, Joint mobilization, Aquatic Therapy, Dry Needling, Spinal manipulation, Spinal mobilization, Cryotherapy, Moist heat, Taping, Traction, Manual therapy, and Re-evaluation  PLAN FOR NEXT SESSION: ERO next session on land.   Lorrene Reid, PT 07/31/22 2:46 PM    [facility] [name], [address] [place], [place] [address] Phone # [phone] Fax [phone]

## 2022-08-01 NOTE — Therapy (Signed)
OUTPATIENT PHYSICAL THERAPY TREATMENT   Patient Name: Danielle Bruce MRN: [id number] DOB:Jun 20, 1960, 62 y.o., female Today's Date: 08/02/2022       PT End of Session - 08/02/22 1249     Visit Number 47    Date for PT Re-Evaluation 08/11/22    Authorization Type Medicare B- KX now    Progress Note Due on Visit 48    PT Start Time 1200   pt 15 min late   PT Stop Time 1230    PT Time Calculation (min) 30 min    Activity Tolerance Patient tolerated treatment well    Behavior During Therapy [facility] for tasks assessed/performed                                                 Past Medical History:  Diagnosis Date   Abdominal pain, unspecified site 10/18/2012   Anemia    Anxiety    Arthritis    osteoarthritis   ASCUS (atypical squamous cells of undetermined significance) on Pap smear 05/06/2005   NEG HIGH RISK HPV--C&B BIOPSY BENIGN 12/2005   Asthma    Bipolar 2 disorder (HCC)    Cancer (HCC)    skin cancer - basal cell   Colon polyps    Complication of anesthesia    anxious afterwards, will get headaches    Constipation    Depression    Fibromyalgia 10/2013   GERD (gastroesophageal reflux disease)    Hearing loss on left    Heart murmur    never had any problems   Hemorrhoids    High cholesterol    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   cytology negative   IBS (irritable bowel syndrome)    Insomnia    Lymphocytic colitis    Lymphoma (HCC)    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   Bone marrow biopsy showes 8% plasma cells IgA Lambda   Migraines    Nausea alone 10/18/2012   Osteoarthritis    Osteopenia    Peripheral neuropathy    PTSD (post-traumatic stress disorder)    Past Surgical History:  Procedure Laterality Date   BONE MARROW BIOPSY Left 12/18/2013   Plasma cell dyscrasia 8% population of plasma cells   BREAST BIOPSY Right    benign stereo   CESAREAN SECTION  47,82,95   CHOLECYSTECTOMY N/A  10/16/2012   Procedure: LAPAROSCOPIC CHOLECYSTECTOMY;  Surgeon: Clovis Pu. Cornett, MD;  Location: [facility];  Service: General;  Laterality: N/A;   COLONOSCOPY     numerous times   DILATION AND CURETTAGE OF UTERUS     ESOPHAGOGASTRODUODENOSCOPY     HEMORRHOID SURGERY  1993   x3   IUD REMOVAL  02/2015   Mirena   LAPAROSCOPIC LYSIS OF ADHESIONS N/A 10/16/2012   Procedure: LAPAROSCOPIC LYSIS OF ADHESIONS;  Surgeon: Maisie Fus A. Cornett, MD;  Location: [facility];  Service: General;  Laterality: N/A;   LAPAROSCOPY N/A 10/16/2012   Procedure: LAPAROSCOPY DIAGNOSTIC;  Surgeon: Clovis Pu. Cornett, MD;  Location: [facility];  Service: General;  Laterality: N/A;   PELVIC LAPAROSCOPY     RADIOLOGY WITH ANESTHESIA N/A 12/16/2015   Procedure: MRI OF BRAIN WITH AND WITHOUT CONTRAST;  Surgeon: Medication Radiologist, MD;  Location: [facility];  Service: Radiology;  Laterality: N/A;   SHOULDER SURGERY  2007/2008   SPINE SURGERY  2010   cervical  Patient Active Problem List   Diagnosis Date Noted   GAD (generalized anxiety disorder) 12/26/2017   OCD (obsessive compulsive disorder) 12/26/2017   PTSD (post-traumatic stress disorder) 12/26/2017   DDD (degenerative disc disease), cervical 07/14/2016   Primary osteoarthritis of both feet 07/14/2016   Primary osteoarthritis of both hands 07/14/2016   Other fatigue 07/14/2016   History of IBS 07/14/2016   Osteopenia of multiple sites 07/14/2016   Fibromyalgia 01/13/2016   MGUS (monoclonal gammopathy of unknown significance) 12/23/2013   Chronic cholecystitis without calculus 10/18/2012   Abdominal pain, unspecified site 10/18/2012   Nausea alone 10/18/2012   Unspecified constipation 10/18/2012   Depression 10/18/2012   Anxiety    ASCUS (atypical squamous cells of undetermined significance) on Pap smear    IUD    Hemorrhoids 11/29/2010   Abdominal pain, left upper quadrant 11/29/2010    PCP:  Lupita Raider, MD  REFERRING PROVIDER: Lupita Raider, MD  REFERRING  DIAG: fibromyalgia, TMJ dysfunction (added 04/20/22)  THERAPY DIAG:  Abnormal posture  Cramp and spasm  Muscle weakness (generalized)  Rationale for Evaluation and Treatment Rehabilitation  ONSET DATE: chronic pain (fibromyalgia) with flare-up 2 months ago  SUBJECTIVE:                                                                                                                                                                                                         SUBJECTIVE STATEMENT:  I got sick and had to cancel last week, Im so sorry and I really missed exercising in the pool. My breathing was pretty bad last week but today I can be here and actually do the work.     PERTINENT HISTORY:  Anxiety, bipolar disorder, depression, fibromyalgia, IBS, migraines, osteopenia, PTSD  PAIN:  Are you having pain: No Pain location:  TMJ 0/10- chewing sometimes just hurts  Pain description: irritating  Aggravating factors: doing too much Relieving factors: muscle relaxers, rest, stretching  PRECAUTIONS: Other: chronic pain syndrome and depression Other: slow progression with exercise due to chronic condition  WEIGHT BEARING RESTRICTIONS No  FALLS:  Has patient fallen in last 6 months? No  LIVING ENVIRONMENT: Lives with: lives with their family Lives in: House/apartment  OCCUPATION: on disability  PLOF: Independent with basic ADLs and Leisure: yardwork, walking  Pt cares for her 4 young grandchildren- difficulty with care including lifting and carrying  PATIENT GOALS be more active with less pain, exercise at the gym regularly, lift and carry grandchildren and laundry, sleep with fewer interruptions, get back to more gardening.  OBJECTIVE:  DIAGNOSTIC FINDINGS: none recent   PATIENT SURVEYS: Eval: Fibromyalgia Impact Questionnaire: first 2 sections only: 49 12/26/21: 40  02/28/22: 28 (first 2 sections only)  COGNITION: Overall cognitive status: Within functional limits for  tasks assessed  SENSATION: WFL  POSTURE: rounded shoulders and forward head  PALPATION: Diffuse palpable tenderness over bil neck, upper traps, thoracic, lumbar and gluteals with trigger points.    CERVICAL ROM:   Active ROM A/PROM (deg) eval A/ROM 12/26/21 A/ROM 04/20/22  Flexion 55    Extension 40    Right lateral flexion 30 35 40  Left lateral flexion 35 40 45  Right rotation 50 60 65  Left rotation 40 65 65   (Blank rows = not tested) TMJ: opening limited by 25%, clicking with opening on the Rt.  Palpable tenderness over Rt masseter and at TMJ UPPER EXTREMITY ROM: UE A/ROM is limited by 20% into flexion and abduction with pain at end range.  Hip flexibility is limited by 25% in all directions with pain in all directions.  UPPER EXTREMITY MMT: UE: 4+/5, LE 4+/5 except hip flexors 4-/5  TODAY'S TREATMENT:   08/01/22:Pt arrives for aquatic physical therapy. Treatment took place in 3.5-5.5 feet of water. Water temperature was 91 degrees F. Pt entered the pool via stairs reciprocally with mild use of rails. Pt requires buoyancy of water for support and to offload joints with strengthening exercises. Pt utilizes viscosity of the water required for strengthening. Seated water bench with 75% submersion Pt performed seated LE AROM exercises 20x in all planes,   Standing in 50%- 75% depth water pt performed water walking in all 4 directions 10x with ankle fins added for walking duration. VC for speed in order to generate appropriate current for resistance and/or UE movements. UE single buoy wts used for push/pull with UE. 50% depth standing: knee ext Bil 12x2 with small noodle, hip 3 ways with ankle fins 12x required some UE for balance today, core/lat press 20x with double buoy wts.     Date: 07/31/22 NuStep: Level 3 x 10 minutes for endurance and mobility-PT present to discuss progress. Ball roll outs forward and lateral x10 each Seated on ball: 3 way raises with 2# weights  x10 Standing rows and shoulder extension: red band  Trigger Point Dry-Needling  Treatment instructions: Expect mild to moderate muscle soreness. S/S of pneumothorax if dry needled over a lung field, and to seek immediate medical attention should they occur. Patient verbalized understanding of these instructions and education.  Patient Consent Given: Yes Education handout provided: Previously provided Muscles treated: Lt rhomboids, upper trap, Lt thoracic multifidi Treatment response/outcome: Utilized skilled palpation to identify trigger points.  During dry needling able to palpate muscle twitch and muscle elongation  Elongation and release to Lt rhomboids and upper traps  Skilled palpation and monitoring by PT during dry needling  07/12/22:Pt arrives for aquatic physical therapy. Treatment took place in 3.5-5.5 feet of water. Water temperature was 92 degrees F. Pt entered the pool via stairs reciprocally with mild use of rails. Pt requires buoyancy of water for support and to offload joints with strengthening exercises. Pt utilizes viscosity of the water required for strengthening. Seated water bench with 75% submersion Pt performed seated LE AROM exercises 20x in all planes,   Standing in 50%- 75% depth water pt performed water walking in all 4 directions 10x with ankle fins added for walking duration. VC for speed in order to generate appropriate current for resistance and/or UE movements. UE single  buoy wts used for push/pull with UE. 50% depth standing: knee ext Bil 15x2 with small noodle, hip 3 ways with ankle fins 20x required some UE for balance today, core/lat press 20x with double buoy wts, 10 min Horizontal decompression float with 100% flotation and PTA providing lateral sway to mobilize trunk.    HOME EXERCISE PROGRAM: Access Code: [email or web address] Access Code: [email or web address] URL: [email or web address] Date: 04/20/2022 Prepared by: Tresa Endo  Exercises - - Controlled Jaw Opening  with Tongue on Roof of Mouth  - 3 x daily - 7 x weekly - 1 sets - 10 reps - Jaw Protraction  - 3 x daily - 7 x weekly - 1 sets - 10 reps ASSESSMENT:  CLINICAL IMPRESSION:  Pt arrives to aquatic PT with no pain and just getting over a sickness that precluded her from being at her appt. Some exercises we had to ramp down the reps a bit but not significantly. She was recovering from a respiratory virus so she basically got a little SOB but more of a fatigue today. No pain.  OBJECTIVE IMPAIRMENTS decreased activity tolerance, decreased endurance, difficulty walking, decreased strength, increased muscle spasms, impaired flexibility, improper body mechanics, postural dysfunction, and pain.   ACTIVITY LIMITATIONS carrying, lifting, sitting, standing, reach over head, hygiene/grooming, and locomotion level  PARTICIPATION LIMITATIONS: meal prep, cleaning, laundry, driving, community activity, and yard work  PERSONAL FACTORS Past/current experiences, Time since onset of injury/illness/exacerbation, and 3+ comorbidities: fibromyalgia depression, anxiety,   are also affecting patient's functional outcome.   REHAB POTENTIAL: Good  CLINICAL DECISION MAKING: Evolving/moderate complexity  EVALUATION COMPLEXITY: Moderate   GOALS: Goals reviewed with patient? Yes  SHORT TERM GOALS: Target date: 03/20/22  Return to regular performance of HEP 2-3x/day to improve mobility  Baseline: stretching 2x/day (02/28/22) Goal status: In progress  2.  Report > or = to 25% reduction in cervical and lumbar pain with ADLs and self-care  Baseline: 20% better overall (02/28/2022) Goal status: in progress   3.  Reduce fibromyalgia impact score to < or = to 39  Baseline: 28 (02/28/22) Goal status: MET    LONG TERM GOALS: Target date: 08/11/22  Be independent in advanced HEP Baseline: gentle exercise and able to progress depending on pain level (06/12/22) Goal status: In progress   2.  Reduce fibromyalgia impact  questionnaire to < or = to 21 Baseline: 28 (02/28/22) Goal status: Revised   3.  Lift and carry laundry and her grandchildren with > or = to 50% reduction in pain Baseline: shoulder and neck 30%, low back 40% Goal status: In progress   4.  Report > or = to 60% reduction in neck and lumbar pain with ADLs and self-care  Baseline: shoulder and neck 30%, low back 40% Goal status: in progress   5.  Return to yardwork verbalize appropriate rest breaks and mechanics  Baseline: has been able to do gentle things, will work to do more (06/12/22) Goal status: In progress    6. Report > or = to 30% reduction in Rt jaw pain with chewing and talking   Baseline  pain is resolved at this time (06/12/22)  Goal Status: MET PLAN: PT FREQUENCY: 2x/week  PT DURATION: 8 weeks  PLANNED INTERVENTIONS: Therapeutic exercises, Therapeutic activity, Neuromuscular re-education, Balance training, Gait training, Patient/Family education, Self Care, Joint mobilization, Aquatic Therapy, Dry Needling, Spinal manipulation, Spinal mobilization, Cryotherapy, Moist heat, Taping, Traction, Manual therapy, and Re-evaluation  PLAN FOR NEXT SESSION: ERO next session on land.   Ane Payment,  PTA 08/02/22 12:50 PM

## 2022-08-02 ENCOUNTER — Ambulatory Visit: Payer: Medicare Other | Admitting: Physical Therapy

## 2022-08-02 ENCOUNTER — Ambulatory Visit (INDEPENDENT_AMBULATORY_CARE_PROVIDER_SITE_OTHER): Payer: Medicare Other | Admitting: Psychology

## 2022-08-02 ENCOUNTER — Ambulatory Visit: Payer: Medicare Other | Admitting: Psychology

## 2022-08-02 ENCOUNTER — Encounter: Payer: Self-pay | Admitting: Physical Therapy

## 2022-08-02 DIAGNOSIS — R293 Abnormal posture: Secondary | ICD-10-CM

## 2022-08-02 DIAGNOSIS — M6281 Muscle weakness (generalized): Secondary | ICD-10-CM | POA: Diagnosis not present

## 2022-08-02 DIAGNOSIS — R252 Cramp and spasm: Secondary | ICD-10-CM

## 2022-08-02 DIAGNOSIS — F331 Major depressive disorder, recurrent, moderate: Secondary | ICD-10-CM

## 2022-08-02 DIAGNOSIS — M542 Cervicalgia: Secondary | ICD-10-CM | POA: Diagnosis not present

## 2022-08-02 DIAGNOSIS — F4310 Post-traumatic stress disorder, unspecified: Secondary | ICD-10-CM

## 2022-08-02 DIAGNOSIS — F411 Generalized anxiety disorder: Secondary | ICD-10-CM

## 2022-08-02 DIAGNOSIS — F428 Other obsessive-compulsive disorder: Secondary | ICD-10-CM

## 2022-08-02 DIAGNOSIS — M353 Polymyalgia rheumatica: Secondary | ICD-10-CM | POA: Diagnosis not present

## 2022-08-02 DIAGNOSIS — F902 Attention-deficit hyperactivity disorder, combined type: Secondary | ICD-10-CM

## 2022-08-02 NOTE — Progress Notes (Signed)
                [name] Zingg G Deziah Renwick, LCSW

## 2022-08-07 ENCOUNTER — Ambulatory Visit: Payer: Medicare Other

## 2022-08-07 DIAGNOSIS — M6281 Muscle weakness (generalized): Secondary | ICD-10-CM

## 2022-08-07 DIAGNOSIS — M542 Cervicalgia: Secondary | ICD-10-CM | POA: Diagnosis not present

## 2022-08-07 DIAGNOSIS — R293 Abnormal posture: Secondary | ICD-10-CM | POA: Diagnosis not present

## 2022-08-07 DIAGNOSIS — R252 Cramp and spasm: Secondary | ICD-10-CM | POA: Diagnosis not present

## 2022-08-07 DIAGNOSIS — M353 Polymyalgia rheumatica: Secondary | ICD-10-CM

## 2022-08-07 NOTE — Therapy (Signed)
OUTPATIENT PHYSICAL THERAPY TREATMENT   Patient Name: Danielle Bruce MRN: [id number] DOB:12/26/1960, 62 y.o., female Today's Date: 08/07/2022     Progress Note Reporting Period 06/14/22 to 08/07/22  See note below for Objective Data and Assessment of Progress/Goals.      PT End of Session - 08/07/22 1103     Visit Number 48    Date for PT Re-Evaluation 09/29/22    Authorization Type Medicare B- KX now    Progress Note Due on Visit 58    PT Start Time 1019    PT Stop Time 1101    PT Time Calculation (min) 42 min    Activity Tolerance Patient tolerated treatment well    Behavior During Therapy WFL for tasks assessed/performed                                                  Past Medical History:  Diagnosis Date   Abdominal pain, unspecified site 10/18/2012   Anemia    Anxiety    Arthritis    osteoarthritis   ASCUS (atypical squamous cells of undetermined significance) on Pap smear 05/06/2005   NEG HIGH RISK HPV--C&B BIOPSY BENIGN 12/2005   Asthma    Bipolar 2 disorder (HCC)    Cancer (HCC)    skin cancer - basal cell   Colon polyps    Complication of anesthesia    anxious afterwards, will get headaches    Constipation    Depression    Fibromyalgia 10/2013   GERD (gastroesophageal reflux disease)    Hearing loss on left    Heart murmur    never had any problems   Hemorrhoids    High cholesterol    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   cytology negative   IBS (irritable bowel syndrome)    Insomnia    Lymphocytic colitis    Lymphoma (HCC)    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   Bone marrow biopsy showes 8% plasma cells IgA Lambda   Migraines    Nausea alone 10/18/2012   Osteoarthritis    Osteopenia    Peripheral neuropathy    PTSD (post-traumatic stress disorder)    Past Surgical History:  Procedure Laterality Date   BONE MARROW BIOPSY Left 12/18/2013   Plasma cell dyscrasia 8% population of  plasma cells   BREAST BIOPSY Right    benign stereo   CESAREAN SECTION  40,98,11   CHOLECYSTECTOMY N/A 10/16/2012   Procedure: LAPAROSCOPIC CHOLECYSTECTOMY;  Surgeon: Clovis Pu. Cornett, MD;  Location: [facility];  Service: General;  Laterality: N/A;   COLONOSCOPY     numerous times   DILATION AND CURETTAGE OF UTERUS     ESOPHAGOGASTRODUODENOSCOPY     HEMORRHOID SURGERY  1993   x3   IUD REMOVAL  02/2015   Mirena   LAPAROSCOPIC LYSIS OF ADHESIONS N/A 10/16/2012   Procedure: LAPAROSCOPIC LYSIS OF ADHESIONS;  Surgeon: Maisie Fus A. Cornett, MD;  Location: [facility];  Service: General;  Laterality: N/A;   LAPAROSCOPY N/A 10/16/2012   Procedure: LAPAROSCOPY DIAGNOSTIC;  Surgeon: Clovis Pu. Cornett, MD;  Location: [facility];  Service: General;  Laterality: N/A;   PELVIC LAPAROSCOPY     RADIOLOGY WITH ANESTHESIA N/A 12/16/2015   Procedure: MRI OF BRAIN WITH AND WITHOUT CONTRAST;  Surgeon: Medication Radiologist, MD;  Location: [facility];  Service: Radiology;  Laterality: N/A;   SHOULDER SURGERY  2007/2008   SPINE SURGERY  2010   cervical   Patient Active Problem List   Diagnosis Date Noted   GAD (generalized anxiety disorder) 12/26/2017   OCD (obsessive compulsive disorder) 12/26/2017   PTSD (post-traumatic stress disorder) 12/26/2017   DDD (degenerative disc disease), cervical 07/14/2016   Primary osteoarthritis of both feet 07/14/2016   Primary osteoarthritis of both hands 07/14/2016   Other fatigue 07/14/2016   History of IBS 07/14/2016   Osteopenia of multiple sites 07/14/2016   Fibromyalgia 01/13/2016   MGUS (monoclonal gammopathy of unknown significance) 12/23/2013   Chronic cholecystitis without calculus 10/18/2012   Abdominal pain, unspecified site 10/18/2012   Nausea alone 10/18/2012   Unspecified constipation 10/18/2012   Depression 10/18/2012   Anxiety    ASCUS (atypical squamous cells of undetermined significance) on Pap smear    IUD    Hemorrhoids 11/29/2010   Abdominal pain, left upper  quadrant 11/29/2010    PCP:  Lupita Raider, MD  REFERRING PROVIDER: Lupita Raider, MD  REFERRING DIAG: fibromyalgia, TMJ dysfunction (added 04/20/22)  THERAPY DIAG:  Abnormal posture - Plan: PT plan of care cert/re-cert  Cramp and spasm - Plan: PT plan of care cert/re-cert  Muscle weakness (generalized) - Plan: PT plan of care cert/re-cert  Polymyalgia rheumatica (HCC) - Plan: PT plan of care cert/re-cert  Rationale for Evaluation and Treatment Rehabilitation  ONSET DATE: chronic pain (fibromyalgia) with flare-up 2 months ago  SUBJECTIVE:                                                                                                                                                                                                         SUBJECTIVE STATEMENT:  I have a headache today and arm/upper back pain due to care      PERTINENT HISTORY:  Anxiety, bipolar disorder, depression, fibromyalgia, IBS, migraines, osteopenia, PTSD  PAIN:  Are you having pain: yes Pain location: 6/10, neck, shoulders TMJ 0/10- chewing sometimes just hurts  Pain description: irritating  Aggravating factors: doing too much Relieving factors: muscle relaxers, rest, stretching  PRECAUTIONS: Other: chronic pain syndrome and depression Other: slow progression with exercise due to chronic condition  WEIGHT BEARING RESTRICTIONS No  FALLS:  Has patient fallen in last 6 months? No  LIVING ENVIRONMENT: Lives with: lives with their family Lives in: House/apartment  OCCUPATION: on disability  PLOF: Independent with basic ADLs and Leisure: yardwork, walking  Pt cares for her 4 young grandchildren- difficulty with care including lifting and carrying  PATIENT GOALS be more  active with less pain, exercise at the gym regularly, lift and carry grandchildren and laundry, sleep with fewer interruptions, get back to more gardening.  OBJECTIVE:   DIAGNOSTIC FINDINGS: none recent   PATIENT SURVEYS:  Eval: Fibromyalgia Impact Questionnaire: first 2 sections only: 49 12/26/21: 40  02/28/22: 28 (first 2 sections only)  COGNITION: Overall cognitive status: Within functional limits for tasks assessed  SENSATION: WFL  POSTURE: rounded shoulders and forward head  PALPATION: Diffuse palpable tenderness over bil neck, upper traps, thoracic, lumbar and gluteals with trigger points.    CERVICAL ROM:   Active ROM A/PROM (deg) eval A/ROM 12/26/21 A/ROM 04/20/22 A/ROM 08/07/22  Flexion 55   60  Extension 40     Right lateral flexion 30 35 40 41  Left lateral flexion 35 40 45 50  Right rotation 50 60 65 70  Left rotation 40 65 65 75   (Blank rows = not tested) TMJ: opening limited by 25%, clicking with opening on the Rt.  Palpable tenderness over Rt masseter and at TMJ UPPER EXTREMITY ROM: UE A/ROM is limited by 20% into flexion and abduction with pain at end range.  Hip flexibility is limited by 25% in all directions with pain in all directions.  UPPER EXTREMITY MMT: UE: 4+/5, LE 4+/5 except hip flexors 4-/5  TODAY'S TREATMENT:  Date: 08/07/22 NuStep: Level 3 x 10 minutes for endurance and mobility-PT present to discuss progress. Ball roll outs forward and lateral x10 each Cervical A/ROM: measurements taken Trigger Point Dry-Needling  Treatment instructions: Expect mild to moderate muscle soreness. S/S of pneumothorax if dry needled over a lung field, and to seek immediate medical attention should they occur. Patient verbalized understanding of these instructions and education.  Patient Consent Given: Yes Education handout provided: Previously provided Muscles treated: bil cervical paraspinals, suboccipitals, rhomboids Treatment response/outcome: Utilized skilled palpation to identify trigger points.  During dry needling able to palpate muscle twitch and muscle elongation  Elongation and release to Lt rhomboids and upper traps  Skilled palpation and monitoring by PT during dry  needling   08/01/22:Pt arrives for aquatic physical therapy. Treatment took place in 3.5-5.5 feet of water. Water temperature was 91 degrees F. Pt entered the pool via stairs reciprocally with mild use of rails. Pt requires buoyancy of water for support and to offload joints with strengthening exercises. Pt utilizes viscosity of the water required for strengthening. Seated water bench with 75% submersion Pt performed seated LE AROM exercises 20x in all planes,   Standing in 50%- 75% depth water pt performed water walking in all 4 directions 10x with ankle fins added for walking duration. VC for speed in order to generate appropriate current for resistance and/or UE movements. UE single buoy wts used for push/pull with UE. 50% depth standing: knee ext Bil 12x2 with small noodle, hip 3 ways with ankle fins 12x required some UE for balance today, core/lat press 20x with double buoy wts.   Date: 07/31/22 NuStep: Level 3 x 10 minutes for endurance and mobility-PT present to discuss progress. Ball roll outs forward and lateral x10 each Seated on ball: 3 way raises with 2# weights x10 Standing rows and shoulder extension: red band  Trigger Point Dry-Needling  Treatment instructions: Expect mild to moderate muscle soreness. S/S of pneumothorax if dry needled over a lung field, and to seek immediate medical attention should they occur. Patient verbalized understanding of these instructions and education.  Patient Consent Given: Yes Education handout provided: Previously provided Muscles treated:  Lt rhomboids, upper trap, Lt thoracic multifidi Treatment response/outcome: Utilized skilled palpation to identify trigger points.  During dry needling able to palpate muscle twitch and muscle elongation  Elongation and release to Lt rhomboids and upper traps  Skilled palpation and monitoring by PT during dry needling   07/12/22:Pt arrives for aquatic physical therapy. Treatment took place in 3.5-5.5 feet of water.  Water temperature was 92 degrees F. Pt entered the pool via stairs reciprocally with mild use of rails. Pt requires buoyancy of water for support and to offload joints with strengthening exercises. Pt utilizes viscosity of the water required for strengthening. Seated water bench with 75% submersion Pt performed seated LE AROM exercises 20x in all planes,   Standing in 50%- 75% depth water pt performed water walking in all 4 directions 10x with ankle fins added for walking duration. VC for speed in order to generate appropriate current for resistance and/or UE movements. UE single buoy wts used for push/pull with UE. 50% depth standing: knee ext Bil 15x2 with small noodle, hip 3 ways with ankle fins 20x required some UE for balance today, core/lat press 20x with double buoy wts, 10 min Horizontal decompression float with 100% flotation and PTA providing lateral sway to mobilize trunk.    HOME EXERCISE PROGRAM: Access Code: [email or web address] Access Code: [email or web address] URL: [email or web address] Date: 04/20/2022 Prepared by: Tresa Endo  Exercises - - Controlled Jaw Opening with Tongue on Roof of Mouth  - 3 x daily - 7 x weekly - 1 sets - 10 reps - Jaw Protraction  - 3 x daily - 7 x weekly - 1 sets - 10 reps ASSESSMENT:  CLINICAL IMPRESSION:  Pt arrived today with a headache and upper back pain due to activity with grandchildren over the weekend.  Pt with good response to DN today with twitch response and improved tissue mobility with reduced pain after.  Pt reports 50% overall pain reduction in neck and low back with care of grandchildren and housework.  She is able to yardwork while utilizing breaks to manage pain. Pt is exercising as able and responds well to the benefits of water therapy due to chronic pain condition.  Cervical A/ROM is improved today.  Patient will benefit from skilled PT to address the below impairments and improve overall function.   OBJECTIVE IMPAIRMENTS decreased activity  tolerance, decreased endurance, difficulty walking, decreased strength, increased muscle spasms, impaired flexibility, improper body mechanics, postural dysfunction, and pain.   ACTIVITY LIMITATIONS carrying, lifting, sitting, standing, reach over head, hygiene/grooming, and locomotion level  PARTICIPATION LIMITATIONS: meal prep, cleaning, laundry, driving, community activity, and yard work  PERSONAL FACTORS Past/current experiences, Time since onset of injury/illness/exacerbation, and 3+ comorbidities: fibromyalgia depression, anxiety,   are also affecting patient's functional outcome.   REHAB POTENTIAL: Good  CLINICAL DECISION MAKING: Evolving/moderate complexity  EVALUATION COMPLEXITY: Moderate   GOALS: Goals reviewed with patient? Yes  SHORT TERM GOALS: Target date: 03/20/22  Return to regular performance of HEP 2-3x/day to improve mobility  Baseline: stretching 2x/day (02/28/22) Goal status: In progress  2.  Report > or = to 25% reduction in cervical and lumbar pain with ADLs and self-care  Baseline: 20% better overall (02/28/2022) Goal status: in progress   3.  Reduce fibromyalgia impact score to < or = to 39  Baseline: 28 (02/28/22) Goal status: MET    LONG TERM GOALS: Target date: 09/29/22  Be independent in advanced HEP Baseline: gentle exercise and able to progress depending on pain level (08/07/22) Goal status:  In progress   2.  Reduce fibromyalgia impact questionnaire to < or = to 21 Baseline: 28 (02/28/22) Goal status: Revised   3.  Lift and carry laundry and her grandchildren with > or = to 70% reduction in pain Baseline: shoulder and neck and low back 50% (08/07/22) Goal status: In progress   4.  Report > or = to 60% reduction in neck and lumbar pain with ADLs and self-care  Baseline: 50% (08/07/22) Goal status: revised   5.  Return to yardwork verbalize appropriate rest breaks and mechanics  Baseline: has been able to do gentle things, will work to do more  (06/12/22) Goal status: In progress    6. Report > or = to 30% reduction in Rt jaw pain with chewing and talking   Baseline  pain is resolved at this time (06/12/22)  Goal Status: MET PLAN: PT FREQUENCY: 2x/week  PT DURATION: 8 weeks  PLANNED INTERVENTIONS: Therapeutic exercises, Therapeutic activity, Neuromuscular re-education, Balance training, Gait training, Patient/Family education, Self Care, Joint mobilization, Aquatic Therapy, Dry Needling, Spinal manipulation, Spinal mobilization, Cryotherapy, Moist heat, Taping, Traction, Manual therapy, and Re-evaluation  PLAN FOR NEXT SESSION: continue aquatics, manual therapy to address pain, strength progression  Lorrene Reid, PT 08/07/22 11:09 AM

## 2022-08-08 ENCOUNTER — Ambulatory Visit (INDEPENDENT_AMBULATORY_CARE_PROVIDER_SITE_OTHER): Payer: Medicare Other | Admitting: Psychology

## 2022-08-08 DIAGNOSIS — F331 Major depressive disorder, recurrent, moderate: Secondary | ICD-10-CM | POA: Diagnosis not present

## 2022-08-08 DIAGNOSIS — F428 Other obsessive-compulsive disorder: Secondary | ICD-10-CM | POA: Diagnosis not present

## 2022-08-08 DIAGNOSIS — F4310 Post-traumatic stress disorder, unspecified: Secondary | ICD-10-CM | POA: Diagnosis not present

## 2022-08-08 DIAGNOSIS — F902 Attention-deficit hyperactivity disorder, combined type: Secondary | ICD-10-CM | POA: Diagnosis not present

## 2022-08-08 DIAGNOSIS — F411 Generalized anxiety disorder: Secondary | ICD-10-CM | POA: Diagnosis not present

## 2022-08-08 NOTE — Therapy (Unsigned)
OUTPATIENT PHYSICAL THERAPY TREATMENT   Patient Name: Danielle Bruce MRN: [id number] DOB:[birth year], 62 y.o., female Today's Date: 08/09/2022      PT End of Session - 08/09/22 1302     Visit Number 49    Date for PT Re-Evaluation 09/29/22    Authorization Type Medicare B- KX now    Progress Note Due on Visit 58    PT Start Time 1200   Pt late   PT Stop Time 1238    PT Time Calculation (min) 38 min    Activity Tolerance Patient tolerated treatment well    Behavior During Therapy [facility] for tasks assessed/performed                                                   Past Medical History:  Diagnosis Date   Abdominal pain, unspecified site 10/18/2012   Anemia    Anxiety    Arthritis    osteoarthritis   ASCUS (atypical squamous cells of undetermined significance) on Pap smear 05/06/2005   NEG HIGH RISK HPV--C&B BIOPSY BENIGN 12/2005   Asthma    Bipolar 2 disorder (HCC)    Cancer (HCC)    skin cancer - basal cell   Colon polyps    Complication of anesthesia    anxious afterwards, will get headaches    Constipation    Depression    Fibromyalgia 10/2013   GERD (gastroesophageal reflux disease)    Hearing loss on left    Heart murmur    never had any problems   Hemorrhoids    High cholesterol    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   cytology negative   IBS (irritable bowel syndrome)    Insomnia    Lymphocytic colitis    Lymphoma (HCC)    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   Bone marrow biopsy showes 8% plasma cells IgA Lambda   Migraines    Nausea alone 10/18/2012   Osteoarthritis    Osteopenia    Peripheral neuropathy    PTSD (post-traumatic stress disorder)    Past Surgical History:  Procedure Laterality Date   BONE MARROW BIOPSY Left 12/18/2013   Plasma cell dyscrasia 8% population of plasma cells   BREAST BIOPSY Right    benign stereo   CESAREAN SECTION  82,95,62   CHOLECYSTECTOMY N/A 10/16/2012    Procedure: LAPAROSCOPIC CHOLECYSTECTOMY;  Surgeon: Clovis Pu. Cornett, MD;  Location: [facility];  Service: General;  Laterality: N/A;   COLONOSCOPY     numerous times   DILATION AND CURETTAGE OF UTERUS     ESOPHAGOGASTRODUODENOSCOPY     HEMORRHOID SURGERY  1993   x3   IUD REMOVAL  02/2015   Mirena   LAPAROSCOPIC LYSIS OF ADHESIONS N/A 10/16/2012   Procedure: LAPAROSCOPIC LYSIS OF ADHESIONS;  Surgeon: Maisie Fus A. Cornett, MD;  Location: [facility];  Service: General;  Laterality: N/A;   LAPAROSCOPY N/A 10/16/2012   Procedure: LAPAROSCOPY DIAGNOSTIC;  Surgeon: Clovis Pu. Cornett, MD;  Location: [facility];  Service: General;  Laterality: N/A;   PELVIC LAPAROSCOPY     RADIOLOGY WITH ANESTHESIA N/A 12/16/2015   Procedure: MRI OF BRAIN WITH AND WITHOUT CONTRAST;  Surgeon: Medication Radiologist, MD;  Location: [facility];  Service: Radiology;  Laterality: N/A;   SHOULDER SURGERY  2007/2008   SPINE SURGERY  2010   cervical  Patient Active Problem List   Diagnosis Date Noted   GAD (generalized anxiety disorder) 12/26/2017   OCD (obsessive compulsive disorder) 12/26/2017   PTSD (post-traumatic stress disorder) 12/26/2017   DDD (degenerative disc disease), cervical 07/14/2016   Primary osteoarthritis of both feet 07/14/2016   Primary osteoarthritis of both hands 07/14/2016   Other fatigue 07/14/2016   History of IBS 07/14/2016   Osteopenia of multiple sites 07/14/2016   Fibromyalgia 01/13/2016   MGUS (monoclonal gammopathy of unknown significance) 12/23/2013   Chronic cholecystitis without calculus 10/18/2012   Abdominal pain, unspecified site 10/18/2012   Nausea alone 10/18/2012   Unspecified constipation 10/18/2012   Depression 10/18/2012   Anxiety    ASCUS (atypical squamous cells of undetermined significance) on Pap smear    IUD    Hemorrhoids 11/29/2010   Abdominal pain, left upper quadrant 11/29/2010    PCP:  Lupita Raider, MD  REFERRING PROVIDER: Lupita Raider, MD  REFERRING DIAG:  fibromyalgia, TMJ dysfunction (added 04/20/22)  THERAPY DIAG:  Abnormal posture  Cramp and spasm  Muscle weakness (generalized)  Rationale for Evaluation and Treatment Rehabilitation  ONSET DATE: chronic pain (fibromyalgia) with flare-up 2 months ago  SUBJECTIVE:                                                                                                                                                                                                         SUBJECTIVE STATEMENT:  Sorry I am late, I couldn't find my pool bag. I am doing well today, headache better/gone.     PERTINENT HISTORY:  Anxiety, bipolar disorder, depression, fibromyalgia, IBS, migraines, osteopenia, PTSD  PAIN:  Are you having pain: yes Pain location: 2/10, neck, shoulders TMJ 0/10- chewing sometimes just hurts  Pain description: irritating  Aggravating factors: doing too much Relieving factors: muscle relaxers, rest, stretching  PRECAUTIONS: Other: chronic pain syndrome and depression Other: slow progression with exercise due to chronic condition  WEIGHT BEARING RESTRICTIONS No  FALLS:  Has patient fallen in last 6 months? No  LIVING ENVIRONMENT: Lives with: lives with their family Lives in: House/apartment  OCCUPATION: on disability  PLOF: Independent with basic ADLs and Leisure: yardwork, walking  Pt cares for her 4 young grandchildren- difficulty with care including lifting and carrying  PATIENT GOALS be more active with less pain, exercise at the gym regularly, lift and carry grandchildren and laundry, sleep with fewer interruptions, get back to more gardening.  OBJECTIVE:   DIAGNOSTIC FINDINGS: none recent   PATIENT SURVEYS: Eval: Fibromyalgia Impact Questionnaire: first 2 sections only: 49 12/26/21: 40  02/28/22: 28 (first 2 sections only)  COGNITION: Overall cognitive status: Within functional limits for tasks assessed  SENSATION: WFL  POSTURE: rounded shoulders and forward  head  PALPATION: Diffuse palpable tenderness over bil neck, upper traps, thoracic, lumbar and gluteals with trigger points.    CERVICAL ROM:   Active ROM A/PROM (deg) eval A/ROM 12/26/21 A/ROM 04/20/22 A/ROM 08/07/22  Flexion 55   60  Extension 40     Right lateral flexion 30 35 40 41  Left lateral flexion 35 40 45 50  Right rotation 50 60 65 70  Left rotation 40 65 65 75   (Blank rows = not tested) TMJ: opening limited by 25%, clicking with opening on the Rt.  Palpable tenderness over Rt masseter and at TMJ UPPER EXTREMITY ROM: UE A/ROM is limited by 20% into flexion and abduction with pain at end range.  Hip flexibility is limited by 25% in all directions with pain in all directions.  UPPER EXTREMITY MMT: UE: 4+/5, LE 4+/5 except hip flexors 4-/5  TODAY'S TREATMENT:   08/09/22:Pt arrives for aquatic physical therapy. Treatment took place in 3.5-5.5 feet of water. Water temperature was 91 degrees F. Pt entered the pool via stairs reciprocally with mild use of rails. Pt requires buoyancy of water for support and to offload joints with strengthening exercises. Pt utilizes viscosity of the water required for strengthening. Seated water bench with 75% submersion Pt performed seated LE AROM exercises 20x in all planes,   Standing in 50%- 75% depth water pt performed water walking in all 4 directions 10x with ankle fins added for walking duration. VC for speed in order to generate appropriate current for resistance and/or UE movements. UE single buoy wts used for push/pull with UE. 50% depth standing: knee ext Bil 12x2 with small noodle, hip 3 ways with ankle fins 12x required some UE for balance today, core/lat press 20x with double buoy wts.    Date: 08/07/22 NuStep: Level 3 x 10 minutes for endurance and mobility-PT present to discuss progress. Ball roll outs forward and lateral x10 each Cervical A/ROM: measurements taken Trigger Point Dry-Needling  Treatment instructions: Expect  mild to moderate muscle soreness. S/S of pneumothorax if dry needled over a lung field, and to seek immediate medical attention should they occur. Patient verbalized understanding of these instructions and education.  Patient Consent Given: Yes Education handout provided: Previously provided Muscles treated: bil cervical paraspinals, suboccipitals, rhomboids Treatment response/outcome: Utilized skilled palpation to identify trigger points.  During dry needling able to palpate muscle twitch and muscle elongation  Elongation and release to Lt rhomboids and upper traps  Skilled palpation and monitoring by PT during dry needling   08/01/22:Pt arrives for aquatic physical therapy. Treatment took place in 3.5-5.5 feet of water. Water temperature was 91 degrees F. Pt entered the pool via stairs reciprocally with mild use of rails. Pt requires buoyancy of water for support and to offload joints with strengthening exercises. Pt utilizes viscosity of the water required for strengthening. Seated water bench with 75% submersion Pt performed seated LE AROM exercises 20x in all planes,   Standing in 50%- 75% depth water pt performed water walking in all 4 directions 10x with ankle fins added for walking duration. VC for speed in order to generate appropriate current for resistance and/or UE movements. UE single buoy wts used for push/pull with UE. 50% depth standing: knee ext Bil 12x2 with small noodle, hip 3 ways with ankle fins 12x required some UE for  balance today, core/lat press 20x with double buoy wts.   Date: 07/31/22 NuStep: Level 3 x 10 minutes for endurance and mobility-PT present to discuss progress. Ball roll outs forward and lateral x10 each Seated on ball: 3 way raises with 2# weights x10 Standing rows and shoulder extension: red band  Trigger Point Dry-Needling  Treatment instructions: Expect mild to moderate muscle soreness. S/S of pneumothorax if dry needled over a lung field, and to seek  immediate medical attention should they occur. Patient verbalized understanding of these instructions and education.  Patient Consent Given: Yes Education handout provided: Previously provided Muscles treated: Lt rhomboids, upper trap, Lt thoracic multifidi Treatment response/outcome: Utilized skilled palpation to identify trigger points.  During dry needling able to palpate muscle twitch and muscle elongation  Elongation and release to Lt rhomboids and upper traps  Skilled palpation and monitoring by PT during dry needling   HOME EXERCISE PROGRAM: Access Code: [email or web address] Access Code: [email or web address] URL: [email or web address] Date: 04/20/2022 Prepared by: Tresa Endo  Exercises - - Controlled Jaw Opening with Tongue on Roof of Mouth  - 3 x daily - 7 x weekly - 1 sets - 10 reps - Jaw Protraction  - 3 x daily - 7 x weekly - 1 sets - 10 reps ASSESSMENT:  CLINICAL IMPRESSION:  Pt arrives for aquatic PT treatment today. Pt attempting to push her speed and velocity in the water today with her exercises. At the present time she tolerated them well with no immediate pain but maybe a little fatigue.  OBJECTIVE IMPAIRMENTS decreased activity tolerance, decreased endurance, difficulty walking, decreased strength, increased muscle spasms, impaired flexibility, improper body mechanics, postural dysfunction, and pain.   ACTIVITY LIMITATIONS carrying, lifting, sitting, standing, reach over head, hygiene/grooming, and locomotion level  PARTICIPATION LIMITATIONS: meal prep, cleaning, laundry, driving, community activity, and yard work  PERSONAL FACTORS Past/current experiences, Time since onset of injury/illness/exacerbation, and 3+ comorbidities: fibromyalgia depression, anxiety,   are also affecting patient's functional outcome.   REHAB POTENTIAL: Good  CLINICAL DECISION MAKING: Evolving/moderate complexity  EVALUATION COMPLEXITY: Moderate   GOALS: Goals reviewed with patient? Yes  SHORT  TERM GOALS: Target date: 03/20/22  Return to regular performance of HEP 2-3x/day to improve mobility  Baseline: stretching 2x/day (02/28/22) Goal status: In progress  2.  Report > or = to 25% reduction in cervical and lumbar pain with ADLs and self-care  Baseline: 20% better overall (02/28/2022) Goal status: in progress   3.  Reduce fibromyalgia impact score to < or = to 39  Baseline: 28 (02/28/22) Goal status: MET    LONG TERM GOALS: Target date: 09/29/22  Be independent in advanced HEP Baseline: gentle exercise and able to progress depending on pain level (08/07/22) Goal status: In progress   2.  Reduce fibromyalgia impact questionnaire to < or = to 21 Baseline: 28 (02/28/22) Goal status: Revised   3.  Lift and carry laundry and her grandchildren with > or = to 70% reduction in pain Baseline: shoulder and neck and low back 50% (08/07/22) Goal status: In progress   4.  Report > or = to 60% reduction in neck and lumbar pain with ADLs and self-care  Baseline: 50% (08/07/22) Goal status: revised   5.  Return to yardwork verbalize appropriate rest breaks and mechanics  Baseline: has been able to do gentle things, will work to do more (06/12/22) Goal status: In progress    6. Report > or = to 30% reduction in Rt jaw pain with chewing and talking  Baseline  pain is resolved at this time (06/12/22)  Goal Status: MET PLAN: PT FREQUENCY: 2x/week  PT DURATION: 8 weeks  PLANNED INTERVENTIONS: Therapeutic exercises, Therapeutic activity, Neuromuscular re-education, Balance training, Gait training, Patient/Family education, Self Care, Joint mobilization, Aquatic Therapy, Dry Needling, Spinal manipulation, Spinal mobilization, Cryotherapy, Moist heat, Taping, Traction, Manual therapy, and Re-evaluation  PLAN FOR NEXT SESSION: continue aquatics, manual therapy to address pain, strength progression  Ane Payment, PTA 08/09/22 1:03 PM

## 2022-08-09 ENCOUNTER — Encounter: Payer: Self-pay | Admitting: Physical Therapy

## 2022-08-09 ENCOUNTER — Ambulatory Visit: Payer: Medicare Other | Admitting: Physical Therapy

## 2022-08-09 DIAGNOSIS — R293 Abnormal posture: Secondary | ICD-10-CM | POA: Diagnosis not present

## 2022-08-09 DIAGNOSIS — R252 Cramp and spasm: Secondary | ICD-10-CM

## 2022-08-09 DIAGNOSIS — M542 Cervicalgia: Secondary | ICD-10-CM | POA: Diagnosis not present

## 2022-08-09 DIAGNOSIS — M353 Polymyalgia rheumatica: Secondary | ICD-10-CM | POA: Diagnosis not present

## 2022-08-09 DIAGNOSIS — M6281 Muscle weakness (generalized): Secondary | ICD-10-CM | POA: Diagnosis not present

## 2022-08-09 NOTE — Progress Notes (Signed)
Behavioral Health Counselor/Therapist Progress Note  Patient ID: Danielle Bruce, MRN: [id number],    Date: 08/08/2022  Time Spent: 60 minutes  Treatment Type: Individual Therapy  Reported Symptoms: flashbacks, responding to triggers, depression, anxiety, being busy all of the time, dissociation  Mental Status Exam: Appearance:  Casual     Behavior: Appropriate  Motor: Restlestness  Speech/Language:  Clear and Coherent  Affect: Blunt  Mood: anxious  Thought process: normal  Thought content:   WNL  Sensory/Perceptual disturbances:   WNL  Orientation: oriented to person, place, time/date, and situation  Attention: Good  Concentration: Good  Memory: WNL  Fund of knowledge:  Good  Insight:   Good  Judgment:  Fair  Impulse Control: Good   Risk Assessment: Danger to Self:  No Self-injurious Behavior: No Danger to Others: No Duty to Warn:no Physical Aggression / Violence:No  Access to Firearms a concern: No  Gang Involvement:No   Subjective: The patient attended a face-to-face individual therapy session In the office today.  The patient reports that she has been gardening today and that makes her very happy.  We talked today about what she wants to work towards and of course she said she wanted to work on the trauma that she has experienced however she sometimes will avoid coming into the office to work on that.  We talked about her tendency to berate herself from things from the past and putting expectations are herself that are unrealistic.  We talked about how the thoughts work in your mind and we discussed the need for her to work with herself on her self talk by decreasing her negative self talk about her being a bad mother.  The patient seemed to understand the concepts discussed today and we will discuss further when we want to work on her trauma issues as I am not sure she is quite ready to do that yet.  Interventions: Cognitive Behavioral Therapy, Mindfulness  Meditation, Eye Movement Desensitization and Reprocessing (EMDR), Insight-Oriented, and Interpersonal, psychoeducation  Diagnosis:Major depressive disorder, recurrent episode, moderate (HCC)  PTSD (post-traumatic stress disorder)  Generalized anxiety disorder  Obsessional thoughts  Attention deficit hyperactivity disorder (ADHD), combined type  Plan: Plan of Care: Client Abilities/Strengths  Insightful, motivated, Supportive family Client Treatment Preferences  Outpatient Individual therapy/EMDR  Client Statement of Needs  "I think I have PTSD and I feel like I need another kind of therapy than talk therapy" Treatment Level  Outpatient Individual therapy  Symptoms  Demonstrates an exaggerated startle response.:  (Status: maintained). Depressed  or irritable mood.: (Status:maintained). Describes a reliving of the event,  particularly through dissociative flashbacks.: (Status: maintained). Displays a  significant decline in interest and engagement in activities.:  (Status: maintained). Displays significant psychological and/or physiological distress resulting from internal and external  clues that are reminiscent of the traumatic event.: (Status: maintained).  Experiences disturbances in sleep.:  (Status: maintained). Experiences disturbing  and persistent thoughts, images, and/or perceptions of the traumatic event.:  (Status: maintained). Experiences frequent nightmares.: (Status: maintained).  Feelings of hopelessness, worthlessness, or inappropriate guilt.: No Description Entered (Status:  improved). Has been exposed to a traumatic event involving actual or perceived threat of death or  serious injury.:  (Status: maintained). Impairment in social, occupational, or  other areas of functioning.:(Status: maintained). Intentionally avoids activities,  places, people, or objects (e.g., up-armored vehicles) that evoke memories of the event.: (Status: maintained). Intentionally avoids  thoughts, feelings, or discussions related  to the traumatic event.: (Status: maintained). Reports difficulty concentrating  as  well as feelings of guilt (Status:maintained). Reports response of intense fear,  helplessness, or horror to the traumatic event.:  (Status: maintained).  Problems Addressed  Unipolar Depression, Posttraumatic Stress Disorder (PTSD), Posttraumatic Stress Disorder (PTSD),   Posttraumatic Stress Disorder (PTSD), Posttraumatic Stress Disorder (PTSD)  Goals 1. Develop healthy thinking patterns and beliefs about self, others, and the world that lead to the alleviation and help prevent the relapse of  depression. Objective Identify and replace thoughts and beliefs that support depression. Target Date: 2023/01/04 Frequency: Weekly Progress: 20 Modality: individual Related Interventions 1. Explore and restructure underlying assumptions and beliefs reflected in biased self-talk that  may put the client at risk for relapse or recurrence. 2. Conduct Cognitive-Behavioral Therapy (see Cognitive Behavior Therapy by Reola Calkins; Overcoming Depression by Agapito Games al.), beginning with helping the client learn the connection among  cognition, depressive feelings, and actions. 2. Eliminate or reduce the negative impact trauma related symptoms have  on social, occupational, and family functioning. Objective Learn and implement personal skills to manage challenging situations related to trauma. Target Date: 2023/01/04 Frequency: Weekly Progress: 0 Modality: individual 3. No longer avoids persons, places, activities, and objects that are  reminiscent of the traumatic event. Objective Participate in Eye Movement Desensitization and Reprocessing (EMDR) to reduce emotional distress  related to traumatic thoughts, feelings, and images. Target Date: 2023/01/04 Frequency: Weekly Progress: 0 Modality: individual   Related Interventions 1. Utilize Eye Movement Desensitization and  Reprocessing (EMDR) to reduce the client's  emotional reactivity to the traumatic event and reduce PTSD symptoms. Objective Learn and implement guided self-dialogue to manage thoughts, feelings, and urges brought on by  encounters with trauma-related situations. Target Date: 2024-11/07 Frequency: Weekly Progress: 10 Modality: individual Related Interventions 1. Teach the client a guided self-dialogue procedure in which he/she learns to recognize  maladaptive self-talk, challenges its biases, copes with engendered feelings, overcomes  avoidance, and reinforces his/her accomplishments; review and reinforce progress, problemsolve obstacles. 4. No longer experiences intrusive event recollections, avoidance of event  reminders, intense arousal, or disinterest in activities or  relationships. 5. Thinks about or openly discusses the traumatic event with others  without experiencing psychological or physiological distress. Diagnosis Axis  none F43.10 (Posttraumatic stress disorder) - Open - [Signifier: n/a] Posttraumatic Stress  Disorder  Conditions For Discharge Achievement of treatment goals and objectives   Stanislaus Kaltenbach G Quinnlyn Hearns, LCSW

## 2022-08-15 ENCOUNTER — Ambulatory Visit: Payer: Medicare Other | Admitting: Psychology

## 2022-08-17 ENCOUNTER — Other Ambulatory Visit: Payer: Self-pay | Admitting: Psychiatry

## 2022-08-17 ENCOUNTER — Other Ambulatory Visit (HOSPITAL_BASED_OUTPATIENT_CLINIC_OR_DEPARTMENT_OTHER): Payer: Self-pay

## 2022-08-17 ENCOUNTER — Ambulatory Visit: Payer: Medicare Other

## 2022-08-17 DIAGNOSIS — M6281 Muscle weakness (generalized): Secondary | ICD-10-CM | POA: Diagnosis not present

## 2022-08-17 DIAGNOSIS — R252 Cramp and spasm: Secondary | ICD-10-CM

## 2022-08-17 DIAGNOSIS — M353 Polymyalgia rheumatica: Secondary | ICD-10-CM

## 2022-08-17 DIAGNOSIS — R293 Abnormal posture: Secondary | ICD-10-CM

## 2022-08-17 DIAGNOSIS — F902 Attention-deficit hyperactivity disorder, combined type: Secondary | ICD-10-CM

## 2022-08-17 DIAGNOSIS — M542 Cervicalgia: Secondary | ICD-10-CM | POA: Diagnosis not present

## 2022-08-17 DIAGNOSIS — F5081 Binge eating disorder: Secondary | ICD-10-CM

## 2022-08-17 MED ORDER — LISDEXAMFETAMINE DIMESYLATE 50 MG PO CAPS
50.0000 mg | ORAL_CAPSULE | Freq: Every day | ORAL | 0 refills | Status: DC
  Filled 2022-08-17: qty 30, 30d supply, fill #0

## 2022-08-17 MED ORDER — LISDEXAMFETAMINE DIMESYLATE 50 MG PO CAPS
50.0000 mg | ORAL_CAPSULE | Freq: Every day | ORAL | 0 refills | Status: DC
  Filled 2022-08-17 – 2022-09-28 (×2): qty 30, 30d supply, fill #0

## 2022-08-17 NOTE — Therapy (Signed)
OUTPATIENT PHYSICAL THERAPY TREATMENT   Patient Name: Danielle Bruce MRN: [id number] DOB:09-09-1960, 62 y.o., female Today's Date: 08/17/2022      PT End of Session - 08/17/22 1324     Visit Number 50    Date for PT Re-Evaluation 09/29/22    Authorization Type Medicare B- KX now    Progress Note Due on Visit 58    PT Start Time 1233    PT Stop Time 1316    PT Time Calculation (min) 43 min    Activity Tolerance Patient tolerated treatment well    Behavior During Therapy [facility] for tasks assessed/performed                                                    Past Medical History:  Diagnosis Date   Abdominal pain, unspecified site 10/18/2012   Anemia    Anxiety    Arthritis    osteoarthritis   ASCUS (atypical squamous cells of undetermined significance) on Pap smear 05/06/2005   NEG HIGH RISK HPV--C&B BIOPSY BENIGN 12/2005   Asthma    Bipolar 2 disorder (HCC)    Cancer (HCC)    skin cancer - basal cell   Colon polyps    Complication of anesthesia    anxious afterwards, will get headaches    Constipation    Depression    Fibromyalgia 10/2013   GERD (gastroesophageal reflux disease)    Hearing loss on left    Heart murmur    never had any problems   Hemorrhoids    High cholesterol    High risk HPV infection 08/2011   cytology negative   IBS (irritable bowel syndrome)    Insomnia    Lymphocytic colitis    Lymphoma (HCC)    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   Bone marrow biopsy showes 8% plasma cells IgA Lambda   Migraines    Nausea alone 10/18/2012   Osteoarthritis    Osteopenia    Peripheral neuropathy    PTSD (post-traumatic stress disorder)    Past Surgical History:  Procedure Laterality Date   BONE MARROW BIOPSY Left 12/18/2013   Plasma cell dyscrasia 8% population of plasma cells   BREAST BIOPSY Right    benign stereo   CESAREAN SECTION  40,98,11   CHOLECYSTECTOMY N/A 10/16/2012    Procedure: LAPAROSCOPIC CHOLECYSTECTOMY;  Surgeon: Clovis Pu. Cornett, MD;  Location: [facility];  Service: General;  Laterality: N/A;   COLONOSCOPY     numerous times   DILATION AND CURETTAGE OF UTERUS     ESOPHAGOGASTRODUODENOSCOPY     HEMORRHOID SURGERY  1993   x3   IUD REMOVAL  02/2015   Mirena   LAPAROSCOPIC LYSIS OF ADHESIONS N/A 10/16/2012   Procedure: LAPAROSCOPIC LYSIS OF ADHESIONS;  Surgeon: Maisie Fus A. Cornett, MD;  Location: [facility];  Service: General;  Laterality: N/A;   LAPAROSCOPY N/A 10/16/2012   Procedure: LAPAROSCOPY DIAGNOSTIC;  Surgeon: Clovis Pu. Cornett, MD;  Location: [facility];  Service: General;  Laterality: N/A;   PELVIC LAPAROSCOPY     RADIOLOGY WITH ANESTHESIA N/A 12/16/2015   Procedure: MRI OF BRAIN WITH AND WITHOUT CONTRAST;  Surgeon: Medication Radiologist, MD;  Location: [facility];  Service: Radiology;  Laterality: N/A;   SHOULDER SURGERY  2007/2008   SPINE SURGERY  2010   cervical   Patient  Active Problem List   Diagnosis Date Noted   GAD (generalized anxiety disorder) 12/26/2017   OCD (obsessive compulsive disorder) 12/26/2017   PTSD (post-traumatic stress disorder) 12/26/2017   DDD (degenerative disc disease), cervical 07/14/2016   Primary osteoarthritis of both feet 07/14/2016   Primary osteoarthritis of both hands 07/14/2016   Other fatigue 07/14/2016   History of IBS 07/14/2016   Osteopenia of multiple sites 07/14/2016   Fibromyalgia 01/13/2016   MGUS (monoclonal gammopathy of unknown significance) 12/23/2013   Chronic cholecystitis without calculus 10/18/2012   Abdominal pain, unspecified site 10/18/2012   Nausea alone 10/18/2012   Unspecified constipation 10/18/2012   Depression 10/18/2012   Anxiety    ASCUS (atypical squamous cells of undetermined significance) on Pap smear    IUD    Hemorrhoids 11/29/2010   Abdominal pain, left upper quadrant 11/29/2010    PCP:  Lupita Raider, MD  REFERRING PROVIDER: Lupita Raider, MD  REFERRING DIAG:  fibromyalgia, TMJ dysfunction (added 04/20/22)  THERAPY DIAG:  Abnormal posture  Cramp and spasm  Muscle weakness (generalized)  Polymyalgia rheumatica (HCC)  Rationale for Evaluation and Treatment Rehabilitation  ONSET DATE: chronic pain (fibromyalgia) with flare-up 2 months ago  SUBJECTIVE:                                                                                                                                                                                                         SUBJECTIVE STATEMENT:  I had a fibro flare over the past week.  I was at my daughter's house for the week and I just drove back today.      PERTINENT HISTORY:  Anxiety, bipolar disorder, depression, fibromyalgia, IBS, migraines, osteopenia, PTSD  PAIN:  Are you having pain: yes Pain location: 3/10, neck, shoulders TMJ 0/10- chewing sometimes just hurts  Pain description: irritating  Aggravating factors: doing too much Relieving factors: muscle relaxers, rest, stretching  PRECAUTIONS: Other: chronic pain syndrome and depression Other: slow progression with exercise due to chronic condition  WEIGHT BEARING RESTRICTIONS No  FALLS:  Has patient fallen in last 6 months? No  LIVING ENVIRONMENT: Lives with: lives with their family Lives in: House/apartment  OCCUPATION: on disability  PLOF: Independent with basic ADLs and Leisure: yardwork, walking  Pt cares for her 4 young grandchildren- difficulty with care including lifting and carrying  PATIENT GOALS be more active with less pain, exercise at the gym regularly, lift and carry grandchildren and laundry, sleep with fewer interruptions, get back to more gardening.  OBJECTIVE:   DIAGNOSTIC FINDINGS: none recent   PATIENT  SURVEYS: Eval: Fibromyalgia Impact Questionnaire: first 2 sections only: 49 12/26/21: 40  02/28/22: 28 (first 2 sections only)  COGNITION: Overall cognitive status: Within functional limits for tasks  assessed  SENSATION: WFL  POSTURE: rounded shoulders and forward head  PALPATION: Diffuse palpable tenderness over bil neck, upper traps, thoracic, lumbar and gluteals with trigger points.    CERVICAL ROM:   Active ROM A/PROM (deg) eval A/ROM 12/26/21 A/ROM 04/20/22 A/ROM 08/07/22  Flexion 55   60  Extension 40     Right lateral flexion 30 35 40 41  Left lateral flexion 35 40 45 50  Right rotation 50 60 65 70  Left rotation 40 65 65 75   (Blank rows = not tested) TMJ: opening limited by 25%, clicking with opening on the Rt.  Palpable tenderness over Rt masseter and at TMJ UPPER EXTREMITY ROM: UE A/ROM is limited by 20% into flexion and abduction with pain at end range.  Hip flexibility is limited by 25% in all directions with pain in all directions.  UPPER EXTREMITY MMT: UE: 4+/5, LE 4+/5 except hip flexors 4-/5  TODAY'S TREATMENT:  08/17/22: NuStep: Level 3 x 10 minutes for endurance and mobility-PT present to discuss progress. Ball roll outs forward and lateral x10 each Seated on ball: 3 way raises with 2# weights x10   Green theraband: rows and shoulder extension 2x10 Trigger Point Dry-Needling  Treatment instructions: Expect mild to moderate muscle soreness. S/S of pneumothorax if dry needled over a lung field, and to seek immediate medical attention should they occur. Patient verbalized understanding of these instructions and education.  Patient Consent Given: Yes Education handout provided: Previously provided Muscles treated: bil cervical paraspinals, suboccipitals, upper traps Treatment response/outcome: Utilized skilled palpation to identify trigger points.  During dry needling able to palpate muscle twitch and muscle elongation  Elongation and release to neck and upper traps Skilled palpation and monitoring by PT during dry needling   08/09/22:Pt arrives for aquatic physical therapy. Treatment took place in 3.5-5.5 feet of water. Water temperature was 91 degrees  F. Pt entered the pool via stairs reciprocally with mild use of rails. Pt requires buoyancy of water for support and to offload joints with strengthening exercises. Pt utilizes viscosity of the water required for strengthening. Seated water bench with 75% submersion Pt performed seated LE AROM exercises 20x in all planes,   Standing in 50%- 75% depth water pt performed water walking in all 4 directions 10x with ankle fins added for walking duration. VC for speed in order to generate appropriate current for resistance and/or UE movements. UE single buoy wts used for push/pull with UE. 50% depth standing: knee ext Bil 12x2 with small noodle, hip 3 ways with ankle fins 12x required some UE for balance today, core/lat press 20x with double buoy wts.    Date: 08/07/22 NuStep: Level 3 x 10 minutes for endurance and mobility-PT present to discuss progress. Ball roll outs forward and lateral x10 each Cervical A/ROM: measurements taken Trigger Point Dry-Needling  Treatment instructions: Expect mild to moderate muscle soreness. S/S of pneumothorax if dry needled over a lung field, and to seek immediate medical attention should they occur. Patient verbalized understanding of these instructions and education.  Patient Consent Given: Yes Education handout provided: Previously provided Muscles treated: bil cervical paraspinals, suboccipitals, rhomboids Treatment response/outcome: Utilized skilled palpation to identify trigger points.  During dry needling able to palpate muscle twitch and muscle elongation  Elongation and release to Lt rhomboids and upper  traps  Skilled palpation and monitoring by PT during dry needling    HOME EXERCISE PROGRAM: Access Code: [email or web address] Access Code: [email or web address] URL: [email or web address] Date: 04/20/2022 Prepared by: Tresa Endo  Exercises - - Controlled Jaw Opening with Tongue on Roof of Mouth  - 3 x daily - 7 x weekly - 1 sets - 10 reps - Jaw Protraction  - 3 x  daily - 7 x weekly - 1 sets - 10 reps ASSESSMENT:  CLINICAL IMPRESSION:  Pt had a fibromyalgia flare last week and did a lot of sleeping.  Pt did stretching and yoga exercises while she was out of town at her daughter's house.  Pt arrived today with a headache and upper back pain due to caring for her grandchildren over the past week.   Pt with good response to DN today with twitch response and improved tissue mobility with reduced pain after.   Pt tolerated exercise in the clinic today without increased pain.  Patient will benefit from skilled PT to address the below impairments and improve overall function.   OBJECTIVE IMPAIRMENTS decreased activity tolerance, decreased endurance, difficulty walking, decreased strength, increased muscle spasms, impaired flexibility, improper body mechanics, postural dysfunction, and pain.   ACTIVITY LIMITATIONS carrying, lifting, sitting, standing, reach over head, hygiene/grooming, and locomotion level  PARTICIPATION LIMITATIONS: meal prep, cleaning, laundry, driving, community activity, and yard work  PERSONAL FACTORS Past/current experiences, Time since onset of injury/illness/exacerbation, and 3+ comorbidities: fibromyalgia depression, anxiety,   are also affecting patient's functional outcome.   REHAB POTENTIAL: Good  CLINICAL DECISION MAKING: Evolving/moderate complexity  EVALUATION COMPLEXITY: Moderate   GOALS: Goals reviewed with patient? Yes  SHORT TERM GOALS: Target date: 03/20/22  Return to regular performance of HEP 2-3x/day to improve mobility  Baseline: stretching 2x/day (02/28/22) Goal status: In progress  2.  Report > or = to 25% reduction in cervical and lumbar pain with ADLs and self-care  Baseline: 20% better overall (02/28/2022) Goal status: in progress   3.  Reduce fibromyalgia impact score to < or = to 39  Baseline: 28 (02/28/22) Goal status: MET    LONG TERM GOALS: Target date: 09/29/22  Be independent in advanced  HEP Baseline: gentle exercise and able to progress depending on pain level (08/07/22) Goal status: In progress   2.  Reduce fibromyalgia impact questionnaire to < or = to 21 Baseline: 28 (02/28/22) Goal status: Revised   3.  Lift and carry laundry and her grandchildren with > or = to 70% reduction in pain Baseline: shoulder and neck and low back 50% (08/07/22) Goal status: In progress   4.  Report > or = to 60% reduction in neck and lumbar pain with ADLs and self-care  Baseline: 50% (08/07/22) Goal status: revised   5.  Return to yardwork verbalize appropriate rest breaks and mechanics  Baseline: has been able to do gentle things, will work to do more (06/12/22) Goal status: In progress    6. Report > or = to 30% reduction in Rt jaw pain with chewing and talking   Baseline  pain is resolved at this time (06/12/22)  Goal Status: MET PLAN: PT FREQUENCY: 2x/week  PT DURATION: 8 weeks  PLANNED INTERVENTIONS: Therapeutic exercises, Therapeutic activity, Neuromuscular re-education, Balance training, Gait training, Patient/Family education, Self Care, Joint mobilization, Aquatic Therapy, Dry Needling, Spinal manipulation, Spinal mobilization, Cryotherapy, Moist heat, Taping, Traction, Manual therapy, and Re-evaluation  PLAN FOR NEXT SESSION: continue aquatics, manual therapy to address pain, strength progression  Lorrene Reid, PT 08/17/22  1:26 PM

## 2022-08-21 ENCOUNTER — Telehealth: Payer: Self-pay | Admitting: Psychiatry

## 2022-08-21 ENCOUNTER — Telehealth: Payer: Self-pay

## 2022-08-21 NOTE — Telephone Encounter (Signed)
[employer] approved VYVANSE 50mg   from 6/24-24 until further notice. 30/ 30

## 2022-08-21 NOTE — Telephone Encounter (Signed)
Prior Authorization submitted and approved for BRAND VYVANSE 50 MG with [facility] medicare Part D, effective through 02/26/2098

## 2022-08-22 ENCOUNTER — Ambulatory Visit (INDEPENDENT_AMBULATORY_CARE_PROVIDER_SITE_OTHER): Payer: Medicare Other | Admitting: Psychology

## 2022-08-22 DIAGNOSIS — F4310 Post-traumatic stress disorder, unspecified: Secondary | ICD-10-CM | POA: Diagnosis not present

## 2022-08-22 DIAGNOSIS — F331 Major depressive disorder, recurrent, moderate: Secondary | ICD-10-CM

## 2022-08-22 DIAGNOSIS — F411 Generalized anxiety disorder: Secondary | ICD-10-CM

## 2022-08-22 DIAGNOSIS — F902 Attention-deficit hyperactivity disorder, combined type: Secondary | ICD-10-CM

## 2022-08-22 NOTE — Progress Notes (Signed)
                Unique [place] G Akima Slaugh, LCSW

## 2022-08-24 ENCOUNTER — Ambulatory Visit: Payer: Medicare Other

## 2022-08-24 DIAGNOSIS — M542 Cervicalgia: Secondary | ICD-10-CM

## 2022-08-24 DIAGNOSIS — M353 Polymyalgia rheumatica: Secondary | ICD-10-CM

## 2022-08-24 DIAGNOSIS — R252 Cramp and spasm: Secondary | ICD-10-CM | POA: Diagnosis not present

## 2022-08-24 DIAGNOSIS — M6281 Muscle weakness (generalized): Secondary | ICD-10-CM | POA: Diagnosis not present

## 2022-08-24 DIAGNOSIS — R293 Abnormal posture: Secondary | ICD-10-CM | POA: Diagnosis not present

## 2022-08-24 NOTE — Therapy (Signed)
OUTPATIENT PHYSICAL THERAPY TREATMENT   Patient Name: Danielle Bruce MRN: [id number] DOB:06-18-1960, 62 y.o., female Today's Date: 08/24/2022      PT End of Session - 08/24/22 1311     Visit Number 51    Date for PT Re-Evaluation 09/29/22    Authorization Type Medicare B- KX now    Progress Note Due on Visit 58    PT Start Time 1234    PT Stop Time 1313    PT Time Calculation (min) 39 min    Activity Tolerance Patient tolerated treatment well    Behavior During Therapy [facility] for tasks assessed/performed                                                     Past Medical History:  Diagnosis Date   Abdominal pain, unspecified site 10/18/2012   Anemia    Anxiety    Arthritis    osteoarthritis   ASCUS (atypical squamous cells of undetermined significance) on Pap smear 05/06/2005   NEG HIGH RISK HPV--C&B BIOPSY BENIGN 12/2005   Asthma    Bipolar 2 disorder (HCC)    Cancer (HCC)    skin cancer - basal cell   Colon polyps    Complication of anesthesia    anxious afterwards, will get headaches    Constipation    Depression    Fibromyalgia 10/2013   GERD (gastroesophageal reflux disease)    Hearing loss on left    Heart murmur    never had any problems   Hemorrhoids    High cholesterol    High risk HPV infection 08/2011   cytology negative   IBS (irritable bowel syndrome)    Insomnia    Lymphocytic colitis    Lymphoma (HCC)    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   Bone marrow biopsy showes 8% plasma cells IgA Lambda   Migraines    Nausea alone 10/18/2012   Osteoarthritis    Osteopenia    Peripheral neuropathy    PTSD (post-traumatic stress disorder)    Past Surgical History:  Procedure Laterality Date   BONE MARROW BIOPSY Left 12/18/2013   Plasma cell dyscrasia 8% population of plasma cells   BREAST BIOPSY Right    benign stereo   CESAREAN SECTION  64,40,34   CHOLECYSTECTOMY N/A 10/16/2012    Procedure: LAPAROSCOPIC CHOLECYSTECTOMY;  Surgeon: Clovis Pu. Cornett, MD;  Location: [facility];  Service: General;  Laterality: N/A;   COLONOSCOPY     numerous times   DILATION AND CURETTAGE OF UTERUS     ESOPHAGOGASTRODUODENOSCOPY     HEMORRHOID SURGERY  1993   x3   IUD REMOVAL  02/2015   Mirena   LAPAROSCOPIC LYSIS OF ADHESIONS N/A 10/16/2012   Procedure: LAPAROSCOPIC LYSIS OF ADHESIONS;  Surgeon: Maisie Fus A. Cornett, MD;  Location: [facility];  Service: General;  Laterality: N/A;   LAPAROSCOPY N/A 10/16/2012   Procedure: LAPAROSCOPY DIAGNOSTIC;  Surgeon: Clovis Pu. Cornett, MD;  Location: [facility];  Service: General;  Laterality: N/A;   PELVIC LAPAROSCOPY     RADIOLOGY WITH ANESTHESIA N/A 12/16/2015   Procedure: MRI OF BRAIN WITH AND WITHOUT CONTRAST;  Surgeon: Medication Radiologist, MD;  Location: [facility];  Service: Radiology;  Laterality: N/A;   SHOULDER SURGERY  2007/2008   SPINE SURGERY  2010   cervical  Patient Active Problem List   Diagnosis Date Noted   GAD (generalized anxiety disorder) 12/26/2017   OCD (obsessive compulsive disorder) 12/26/2017   PTSD (post-traumatic stress disorder) 12/26/2017   DDD (degenerative disc disease), cervical 07/14/2016   Primary osteoarthritis of both feet 07/14/2016   Primary osteoarthritis of both hands 07/14/2016   Other fatigue 07/14/2016   History of IBS 07/14/2016   Osteopenia of multiple sites 07/14/2016   Fibromyalgia 01/13/2016   MGUS (monoclonal gammopathy of unknown significance) 12/23/2013   Chronic cholecystitis without calculus 10/18/2012   Abdominal pain, unspecified site 10/18/2012   Nausea alone 10/18/2012   Unspecified constipation 10/18/2012   Depression 10/18/2012   Anxiety    ASCUS (atypical squamous cells of undetermined significance) on Pap smear    IUD    Hemorrhoids 11/29/2010   Abdominal pain, left upper quadrant 11/29/2010    PCP:  Lupita Raider, MD  REFERRING PROVIDER: Lupita Raider, MD  REFERRING DIAG:  fibromyalgia, TMJ dysfunction (added 04/20/22)  THERAPY DIAG:  Abnormal posture  Muscle weakness (generalized)  Cramp and spasm  Polymyalgia rheumatica (HCC)  Cervicalgia  Rationale for Evaluation and Treatment Rehabilitation  ONSET DATE: chronic pain (fibromyalgia) with flare-up 2 months ago  SUBJECTIVE:                                                                                                                                                                                                         SUBJECTIVE STATEMENT:  The dry needling last session really helped to knock out the headache that I had.  I haven't had one since.  My sleep schedule has been off and I'm trying to get things back to a normal routine.        PERTINENT HISTORY:  Anxiety, bipolar disorder, depression, fibromyalgia, IBS, migraines, osteopenia, PTSD  PAIN:  Are you having pain: yes Pain location: 3/10, neck, shoulders TMJ 0/10- chewing sometimes just hurts  Pain description: irritating  Aggravating factors: doing too much Relieving factors: muscle relaxers, rest, stretching  PRECAUTIONS: Other: chronic pain syndrome and depression Other: slow progression with exercise due to chronic condition  WEIGHT BEARING RESTRICTIONS No  FALLS:  Has patient fallen in last 6 months? No  LIVING ENVIRONMENT: Lives with: lives with their family Lives in: House/apartment  OCCUPATION: on disability  PLOF: Independent with basic ADLs and Leisure: yardwork, walking  Pt cares for her 4 young grandchildren- difficulty with care including lifting and carrying  PATIENT GOALS be more active with less pain, exercise at the gym regularly, lift and carry grandchildren and laundry, sleep  with fewer interruptions, get back to more gardening.  OBJECTIVE:   DIAGNOSTIC FINDINGS: none recent   PATIENT SURVEYS: Eval: Fibromyalgia Impact Questionnaire: first 2 sections only: 49 12/26/21: 40  02/28/22: 28 (first 2  sections only)  COGNITION: Overall cognitive status: Within functional limits for tasks assessed  SENSATION: WFL  POSTURE: rounded shoulders and forward head  PALPATION: Diffuse palpable tenderness over bil neck, upper traps, thoracic, lumbar and gluteals with trigger points.    CERVICAL ROM:   Active ROM A/PROM (deg) eval A/ROM 12/26/21 A/ROM 04/20/22 A/ROM 08/07/22  Flexion 55   60  Extension 40     Right lateral flexion 30 35 40 41  Left lateral flexion 35 40 45 50  Right rotation 50 60 65 70  Left rotation 40 65 65 75   (Blank rows = not tested) TMJ: opening limited by 25%, clicking with opening on the Rt.  Palpable tenderness over Rt masseter and at TMJ UPPER EXTREMITY ROM: UE A/ROM is limited by 20% into flexion and abduction with pain at end range.  Hip flexibility is limited by 25% in all directions with pain in all directions.  UPPER EXTREMITY MMT: UE: 4+/5, LE 4+/5 except hip flexors 4-/5  TODAY'S TREATMENT:  08/24/22: NuStep: Level 3 x 10 minutes for endurance and mobility-PT present to discuss progress. Ball roll outs forward and lateral x10 each Sit to stand 5# kettle bell 2x10 Seated on ball: 3 way raises with 2# weights 2x10   Green theraband: rows and shoulder extension 2x10 Seated figure 4 stretch and hamstring stretch 3x 20 seconds bil  08/17/22: NuStep: Level 3 x 10 minutes for endurance and mobility-PT present to discuss progress. Ball roll outs forward and lateral x10 each Seated on ball: 3 way raises with 2# weights x10   Green theraband: rows and shoulder extension 2x10 Trigger Point Dry-Needling  Treatment instructions: Expect mild to moderate muscle soreness. S/S of pneumothorax if dry needled over a lung field, and to seek immediate medical attention should they occur. Patient verbalized understanding of these instructions and education.  Patient Consent Given: Yes Education handout provided: Previously provided Muscles treated: bil  cervical paraspinals, suboccipitals, upper traps Treatment response/outcome: Utilized skilled palpation to identify trigger points.  During dry needling able to palpate muscle twitch and muscle elongation  Elongation and release to neck and upper traps Skilled palpation and monitoring by PT during dry needling   08/09/22:Pt arrives for aquatic physical therapy. Treatment took place in 3.5-5.5 feet of water. Water temperature was 91 degrees F. Pt entered the pool via stairs reciprocally with mild use of rails. Pt requires buoyancy of water for support and to offload joints with strengthening exercises. Pt utilizes viscosity of the water required for strengthening. Seated water bench with 75% submersion Pt performed seated LE AROM exercises 20x in all planes,   Standing in 50%- 75% depth water pt performed water walking in all 4 directions 10x with ankle fins added for walking duration. VC for speed in order to generate appropriate current for resistance and/or UE movements. UE single buoy wts used for push/pull with UE. 50% depth standing: knee ext Bil 12x2 with small noodle, hip 3 ways with ankle fins 12x required some UE for balance today, core/lat press 20x with double buoy wts.   HOME EXERCISE PROGRAM: Access Code: [email or web address] Access Code: [email or web address] URL: [email or web address] Date: 04/20/2022 Prepared by: Tresa Endo  Exercises - - Controlled Jaw Opening with Tongue on Roof of Mouth  - 3 x daily -  7 x weekly - 1 sets - 10 reps - Jaw Protraction  - 3 x daily - 7 x weekly - 1 sets - 10 reps ASSESSMENT:  CLINICAL IMPRESSION:  Pt had good response to DN with headache reduction after.  Pt has been off of her normal sleep schedule and this has impacted her energy.    Pt with good form with all exercise in the clinic and pain was low today so no manual therapy required. PT monitored for pain and provided verbal cues.  Patient will benefit from skilled PT to address the below impairments and  improve overall function.   OBJECTIVE IMPAIRMENTS decreased activity tolerance, decreased endurance, difficulty walking, decreased strength, increased muscle spasms, impaired flexibility, improper body mechanics, postural dysfunction, and pain.   ACTIVITY LIMITATIONS carrying, lifting, sitting, standing, reach over head, hygiene/grooming, and locomotion level  PARTICIPATION LIMITATIONS: meal prep, cleaning, laundry, driving, community activity, and yard work  PERSONAL FACTORS Past/current experiences, Time since onset of injury/illness/exacerbation, and 3+ comorbidities: fibromyalgia depression, anxiety,   are also affecting patient's functional outcome.   REHAB POTENTIAL: Good  CLINICAL DECISION MAKING: Evolving/moderate complexity  EVALUATION COMPLEXITY: Moderate   GOALS: Goals reviewed with patient? Yes  SHORT TERM GOALS: Target date: 03/20/22  Return to regular performance of HEP 2-3x/day to improve mobility  Baseline: stretching 2x/day (02/28/22) Goal status: In progress  2.  Report > or = to 25% reduction in cervical and lumbar pain with ADLs and self-care  Baseline: 20% better overall (02/28/2022) Goal status: in progress   3.  Reduce fibromyalgia impact score to < or = to 39  Baseline: 28 (02/28/22) Goal status: MET    LONG TERM GOALS: Target date: 09/29/22  Be independent in advanced HEP Baseline: gentle exercise and able to progress depending on pain level (08/07/22) Goal status: In progress   2.  Reduce fibromyalgia impact questionnaire to < or = to 21 Baseline: 28 (02/28/22) Goal status: Revised   3.  Lift and carry laundry and her grandchildren with > or = to 70% reduction in pain Baseline: shoulder and neck and low back 50% (08/07/22) Goal status: In progress   4.  Report > or = to 60% reduction in neck and lumbar pain with ADLs and self-care  Baseline: 50% (08/07/22) Goal status: revised   5.  Return to yardwork verbalize appropriate rest breaks and mechanics   Baseline: has been able to do gentle things, will work to do more (06/12/22) Goal status: In progress    6. Report > or = to 30% reduction in Rt jaw pain with chewing and talking   Baseline  pain is resolved at this time (06/12/22)  Goal Status: MET PLAN: PT FREQUENCY: 2x/week  PT DURATION: 8 weeks  PLANNED INTERVENTIONS: Therapeutic exercises, Therapeutic activity, Neuromuscular re-education, Balance training, Gait training, Patient/Family education, Self Care, Joint mobilization, Aquatic Therapy, Dry Needling, Spinal manipulation, Spinal mobilization, Cryotherapy, Moist heat, Taping, Traction, Manual therapy, and Re-evaluation  PLAN FOR NEXT SESSION: continue aquatics, manual therapy to address pain, strength progression  Lorrene Reid, PT 08/24/22 1:16 PM

## 2022-08-24 NOTE — Therapy (Signed)
OUTPATIENT PHYSICAL THERAPY TREATMENT   Patient Name: Danielle Bruce MRN: [id number] DOB:Jan 30, 1961, 61 y.o., female Today's Date: 08/25/2022      PT End of Session - 08/25/22 1235     Visit Number 52    Date for PT Re-Evaluation 09/29/22    Authorization Type Medicare B- KX now    Progress Note Due on Visit 58    PT Start Time 1235   20 min late   PT Stop Time 1308    PT Time Calculation (min) 33 min    Activity Tolerance Patient tolerated treatment well    Behavior During Therapy [facility] for tasks assessed/performed                                                     Past Medical History:  Diagnosis Date   Abdominal pain, unspecified site 10/18/2012   Anemia    Anxiety    Arthritis    osteoarthritis   ASCUS (atypical squamous cells of undetermined significance) on Pap smear 05/06/2005   NEG HIGH RISK HPV--C&B BIOPSY BENIGN 12/2005   Asthma    Bipolar 2 disorder (HCC)    Cancer (HCC)    skin cancer - basal cell   Colon polyps    Complication of anesthesia    anxious afterwards, will get headaches    Constipation    Depression    Fibromyalgia 10/2013   GERD (gastroesophageal reflux disease)    Hearing loss on left    Heart murmur    never had any problems   Hemorrhoids    High cholesterol    High risk HPV infection 08/2011   cytology negative   IBS (irritable bowel syndrome)    Insomnia    Lymphocytic colitis    Lymphoma (HCC)    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   Bone marrow biopsy showes 8% plasma cells IgA Lambda   Migraines    Nausea alone 10/18/2012   Osteoarthritis    Osteopenia    Peripheral neuropathy    PTSD (post-traumatic stress disorder)    Past Surgical History:  Procedure Laterality Date   BONE MARROW BIOPSY Left 12/18/2013   Plasma cell dyscrasia 8% population of plasma cells   BREAST BIOPSY Right    benign stereo   CESAREAN SECTION  40,98,11   CHOLECYSTECTOMY N/A  10/16/2012   Procedure: LAPAROSCOPIC CHOLECYSTECTOMY;  Surgeon: Clovis Pu. Cornett, MD;  Location: [facility];  Service: General;  Laterality: N/A;   COLONOSCOPY     numerous times   DILATION AND CURETTAGE OF UTERUS     ESOPHAGOGASTRODUODENOSCOPY     HEMORRHOID SURGERY  1993   x3   IUD REMOVAL  02/2015   Mirena   LAPAROSCOPIC LYSIS OF ADHESIONS N/A 10/16/2012   Procedure: LAPAROSCOPIC LYSIS OF ADHESIONS;  Surgeon: Maisie Fus A. Cornett, MD;  Location: [facility];  Service: General;  Laterality: N/A;   LAPAROSCOPY N/A 10/16/2012   Procedure: LAPAROSCOPY DIAGNOSTIC;  Surgeon: Clovis Pu. Cornett, MD;  Location: [facility];  Service: General;  Laterality: N/A;   PELVIC LAPAROSCOPY     RADIOLOGY WITH ANESTHESIA N/A 12/16/2015   Procedure: MRI OF BRAIN WITH AND WITHOUT CONTRAST;  Surgeon: Medication Radiologist, MD;  Location: [facility];  Service: Radiology;  Laterality: N/A;   SHOULDER SURGERY  2007/2008   SPINE SURGERY  2010  cervical   Patient Active Problem List   Diagnosis Date Noted   GAD (generalized anxiety disorder) 12/26/2017   OCD (obsessive compulsive disorder) 12/26/2017   PTSD (post-traumatic stress disorder) 12/26/2017   DDD (degenerative disc disease), cervical 07/14/2016   Primary osteoarthritis of both feet 07/14/2016   Primary osteoarthritis of both hands 07/14/2016   Other fatigue 07/14/2016   History of IBS 07/14/2016   Osteopenia of multiple sites 07/14/2016   Fibromyalgia 01/13/2016   MGUS (monoclonal gammopathy of unknown significance) 12/23/2013   Chronic cholecystitis without calculus 10/18/2012   Abdominal pain, unspecified site 10/18/2012   Nausea alone 10/18/2012   Unspecified constipation 10/18/2012   Depression 10/18/2012   Anxiety    ASCUS (atypical squamous cells of undetermined significance) on Pap smear    IUD    Hemorrhoids 11/29/2010   Abdominal pain, left upper quadrant 11/29/2010    PCP:  Lupita Raider, MD  REFERRING PROVIDER: Lupita Raider, MD  REFERRING  DIAG: fibromyalgia, TMJ dysfunction (added 04/20/22)  THERAPY DIAG:  Abnormal posture  Muscle weakness (generalized)  Cramp and spasm  Rationale for Evaluation and Treatment Rehabilitation  ONSET DATE: chronic pain (fibromyalgia) with flare-up 2 months ago  SUBJECTIVE:                                                                                                                                                                                                         SUBJECTIVE STATEMENT:   Sorry I am late, again.   PERTINENT HISTORY:  Anxiety, bipolar disorder, depression, fibromyalgia, IBS, migraines, osteopenia, PTSD  PAIN:  Are you having pain: yes Pain location: 1-2/10, neck, shoulders TMJ 0/10- chewing sometimes just hurts  Pain description: irritating  Aggravating factors: doing too much Relieving factors: muscle relaxers, rest, stretching  PRECAUTIONS: Other: chronic pain syndrome and depression Other: slow progression with exercise due to chronic condition  WEIGHT BEARING RESTRICTIONS No  FALLS:  Has patient fallen in last 6 months? No  LIVING ENVIRONMENT: Lives with: lives with their family Lives in: House/apartment  OCCUPATION: on disability  PLOF: Independent with basic ADLs and Leisure: yardwork, walking  Pt cares for her 4 young grandchildren- difficulty with care including lifting and carrying  PATIENT GOALS be more active with less pain, exercise at the gym regularly, lift and carry grandchildren and laundry, sleep with fewer interruptions, get back to more gardening.  OBJECTIVE:   DIAGNOSTIC FINDINGS: none recent   PATIENT SURVEYS: Eval: Fibromyalgia Impact Questionnaire: first 2 sections only: 49 12/26/21: 40  02/28/22: 28 (first 2 sections only)  COGNITION: Overall  cognitive status: Within functional limits for tasks assessed  SENSATION: WFL  POSTURE: rounded shoulders and forward head  PALPATION: Diffuse palpable tenderness over bil  neck, upper traps, thoracic, lumbar and gluteals with trigger points.    CERVICAL ROM:   Active ROM A/PROM (deg) eval A/ROM 12/26/21 A/ROM 04/20/22 A/ROM 08/07/22  Flexion 55   60  Extension 40     Right lateral flexion 30 35 40 41  Left lateral flexion 35 40 45 50  Right rotation 50 60 65 70  Left rotation 40 65 65 75   (Blank rows = not tested) TMJ: opening limited by 25%, clicking with opening on the Rt.  Palpable tenderness over Rt masseter and at TMJ UPPER EXTREMITY ROM: UE A/ROM is limited by 20% into flexion and abduction with pain at end range.  Hip flexibility is limited by 25% in all directions with pain in all directions.  UPPER EXTREMITY MMT: UE: 4+/5, LE 4+/5 except hip flexors 4-/5  TODAY'S TREATMENT:   08/25/22:Pt arrives for aquatic physical therapy. Treatment took place in 3.5-5.5 feet of water. Water temperature was 91 degrees F. Pt entered the pool via stairs reciprocally with mild use of rails. Pt requires buoyancy of water for support and to offload joints with strengthening exercises. Pt utilizes viscosity of the water required for strengthening. Seated water bench with 75% submersion Pt performed seated LE AROM exercises 20x in all planes,   Standing in 50%- 75% depth water pt performed water walking in all 4 directions 10x with ankle fins added for walking duration. VC for speed in order to generate appropriate current for resistance and/or UE movements. UE single buoy wts used for push/pull with UE. 50% depth standing: knee ext Bil 12x2 with small noodle, hip 3 ways with ankle fins 12x required some UE for balance today, core/lat press 20x with double buoy wts. Underwater bicycle 5 min with noodle in horseback.  08/24/22: NuStep: Level 3 x 10 minutes for endurance and mobility-PT present to discuss progress. Ball roll outs forward and lateral x10 each Sit to stand 5# kettle bell 2x10 Seated on ball: 3 way raises with 2# weights 2x10   Green theraband: rows  and shoulder extension 2x10 Seated figure 4 stretch and hamstring stretch 3x 20 seconds bil  08/17/22: NuStep: Level 3 x 10 minutes for endurance and mobility-PT present to discuss progress. Ball roll outs forward and lateral x10 each Seated on ball: 3 way raises with 2# weights x10   Green theraband: rows and shoulder extension 2x10 Trigger Point Dry-Needling  Treatment instructions: Expect mild to moderate muscle soreness. S/S of pneumothorax if dry needled over a lung field, and to seek immediate medical attention should they occur. Patient verbalized understanding of these instructions and education.  Patient Consent Given: Yes Education handout provided: Previously provided Muscles treated: bil cervical paraspinals, suboccipitals, upper traps Treatment response/outcome: Utilized skilled palpation to identify trigger points.  During dry needling able to palpate muscle twitch and muscle elongation  Elongation and release to neck and upper traps Skilled palpation and monitoring by PT during dry needling   HOME EXERCISE PROGRAM: Access Code: [email or web address] Access Code: [email or web address] URL: [email or web address] Date: 04/20/2022 Prepared by: Tresa Endo  Exercises - - Controlled Jaw Opening with Tongue on Roof of Mouth  - 3 x daily - 7 x weekly - 1 sets - 10 reps - Jaw Protraction  - 3 x daily - 7 x weekly - 1 sets - 10 reps ASSESSMENT:  CLINICAL IMPRESSION:  Pt  was late for session limiting what we could do but since pt was feeling rather good this afternoon she was able to complete most of her program and also complete some bicycle for her overall activity tolerance.   OBJECTIVE IMPAIRMENTS decreased activity tolerance, decreased endurance, difficulty walking, decreased strength, increased muscle spasms, impaired flexibility, improper body mechanics, postural dysfunction, and pain.   ACTIVITY LIMITATIONS carrying, lifting, sitting, standing, reach over head, hygiene/grooming, and  locomotion level  PARTICIPATION LIMITATIONS: meal prep, cleaning, laundry, driving, community activity, and yard work  PERSONAL FACTORS Past/current experiences, Time since onset of injury/illness/exacerbation, and 3+ comorbidities: fibromyalgia depression, anxiety,   are also affecting patient's functional outcome.   REHAB POTENTIAL: Good  CLINICAL DECISION MAKING: Evolving/moderate complexity  EVALUATION COMPLEXITY: Moderate   GOALS: Goals reviewed with patient? Yes  SHORT TERM GOALS: Target date: 03/20/22  Return to regular performance of HEP 2-3x/day to improve mobility  Baseline: stretching 2x/day (02/28/22) Goal status: In progress  2.  Report > or = to 25% reduction in cervical and lumbar pain with ADLs and self-care  Baseline: 20% better overall (02/28/2022) Goal status: in progress   3.  Reduce fibromyalgia impact score to < or = to 39  Baseline: 28 (02/28/22) Goal status: MET    LONG TERM GOALS: Target date: 09/29/22  Be independent in advanced HEP Baseline: gentle exercise and able to progress depending on pain level (08/07/22) Goal status: In progress   2.  Reduce fibromyalgia impact questionnaire to < or = to 21 Baseline: 28 (02/28/22) Goal status: Revised   3.  Lift and carry laundry and her grandchildren with > or = to 70% reduction in pain Baseline: shoulder and neck and low back 50% (08/07/22) Goal status: In progress   4.  Report > or = to 60% reduction in neck and lumbar pain with ADLs and self-care  Baseline: 50% (08/07/22) Goal status: revised   5.  Return to yardwork verbalize appropriate rest breaks and mechanics  Baseline: has been able to do gentle things, will work to do more (06/12/22) Goal status: In progress    6. Report > or = to 30% reduction in Rt jaw pain with chewing and talking   Baseline  pain is resolved at this time (06/12/22)  Goal Status: MET PLAN: PT FREQUENCY: 2x/week  PT DURATION: 8 weeks  PLANNED INTERVENTIONS: Therapeutic  exercises, Therapeutic activity, Neuromuscular re-education, Balance training, Gait training, Patient/Family education, Self Care, Joint mobilization, Aquatic Therapy, Dry Needling, Spinal manipulation, Spinal mobilization, Cryotherapy, Moist heat, Taping, Traction, Manual therapy, and Re-evaluation  PLAN FOR NEXT SESSION: continue aquatics, manual therapy to address pain, strength progression  Ane Payment, PTA 08/25/22 3:04 PM

## 2022-08-25 ENCOUNTER — Encounter: Payer: Self-pay | Admitting: Physical Therapy

## 2022-08-25 ENCOUNTER — Ambulatory Visit: Payer: Medicare Other | Admitting: Physical Therapy

## 2022-08-25 DIAGNOSIS — R252 Cramp and spasm: Secondary | ICD-10-CM | POA: Diagnosis not present

## 2022-08-25 DIAGNOSIS — M6281 Muscle weakness (generalized): Secondary | ICD-10-CM

## 2022-08-25 DIAGNOSIS — R293 Abnormal posture: Secondary | ICD-10-CM | POA: Diagnosis not present

## 2022-08-25 DIAGNOSIS — M542 Cervicalgia: Secondary | ICD-10-CM | POA: Diagnosis not present

## 2022-08-25 DIAGNOSIS — M353 Polymyalgia rheumatica: Secondary | ICD-10-CM | POA: Diagnosis not present

## 2022-08-29 ENCOUNTER — Ambulatory Visit (INDEPENDENT_AMBULATORY_CARE_PROVIDER_SITE_OTHER): Payer: Medicare Other | Admitting: Psychology

## 2022-08-29 DIAGNOSIS — F4310 Post-traumatic stress disorder, unspecified: Secondary | ICD-10-CM

## 2022-08-29 DIAGNOSIS — F902 Attention-deficit hyperactivity disorder, combined type: Secondary | ICD-10-CM | POA: Diagnosis not present

## 2022-08-29 DIAGNOSIS — F411 Generalized anxiety disorder: Secondary | ICD-10-CM

## 2022-08-29 DIAGNOSIS — F428 Other obsessive-compulsive disorder: Secondary | ICD-10-CM

## 2022-08-29 DIAGNOSIS — F331 Major depressive disorder, recurrent, moderate: Secondary | ICD-10-CM | POA: Diagnosis not present

## 2022-08-29 NOTE — Therapy (Deleted)
OUTPATIENT PHYSICAL THERAPY TREATMENT   Patient Name: Danielle Bruce MRN: [id number] DOB:[birth year], 62 y.o., female Today's Date: 08/29/2022                                                Past Medical History:  Diagnosis Date   Abdominal pain, unspecified site 10/18/2012   Anemia    Anxiety    Arthritis    osteoarthritis   ASCUS (atypical squamous cells of undetermined significance) on Pap smear 05/06/2005   NEG HIGH RISK HPV--C&B BIOPSY BENIGN 12/2005   Asthma    Bipolar 2 disorder (HCC)    Cancer (HCC)    skin cancer - basal cell   Colon polyps    Complication of anesthesia    anxious afterwards, will get headaches    Constipation    Depression    Fibromyalgia 10/2013   GERD (gastroesophageal reflux disease)    Hearing loss on left    Heart murmur    never had any problems   Hemorrhoids    High cholesterol    High risk HPV infection 08/2011   cytology negative   IBS (irritable bowel syndrome)    Insomnia    Lymphocytic colitis    Lymphoma (HCC)    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   Bone marrow biopsy showes 8% plasma cells IgA Lambda   Migraines    Nausea alone 10/18/2012   Osteoarthritis    Osteopenia    Peripheral neuropathy    PTSD (post-traumatic stress disorder)    Past Surgical History:  Procedure Laterality Date   BONE MARROW BIOPSY Left 12/18/2013   Plasma cell dyscrasia 8% population of plasma cells   BREAST BIOPSY Right    benign stereo   CESAREAN SECTION  24,40,10   CHOLECYSTECTOMY N/A 10/16/2012   Procedure: LAPAROSCOPIC CHOLECYSTECTOMY;  Surgeon: Clovis Pu. Cornett, MD;  Location: [facility];  Service: General;  Laterality: N/A;   COLONOSCOPY     numerous times   DILATION AND CURETTAGE OF UTERUS     ESOPHAGOGASTRODUODENOSCOPY     HEMORRHOID SURGERY  1993   x3   IUD REMOVAL  02/2015   Mirena   LAPAROSCOPIC LYSIS OF ADHESIONS N/A 10/16/2012   Procedure: LAPAROSCOPIC LYSIS OF  ADHESIONS;  Surgeon: Maisie Fus A. Cornett, MD;  Location: [facility];  Service: General;  Laterality: N/A;   LAPAROSCOPY N/A 10/16/2012   Procedure: LAPAROSCOPY DIAGNOSTIC;  Surgeon: Clovis Pu. Cornett, MD;  Location: [facility];  Service: General;  Laterality: N/A;   PELVIC LAPAROSCOPY     RADIOLOGY WITH ANESTHESIA N/A 12/16/2015   Procedure: MRI OF BRAIN WITH AND WITHOUT CONTRAST;  Surgeon: Medication Radiologist, MD;  Location: [facility];  Service: Radiology;  Laterality: N/A;   SHOULDER SURGERY  2007/2008   SPINE SURGERY  2010   cervical   Patient Active Problem List   Diagnosis Date Noted   GAD (generalized anxiety disorder) 12/26/2017   OCD (obsessive compulsive disorder) 12/26/2017   PTSD (post-traumatic stress disorder) 12/26/2017   DDD (degenerative disc disease), cervical 07/14/2016   Primary osteoarthritis of both feet 07/14/2016   Primary osteoarthritis of both hands 07/14/2016   Other fatigue 07/14/2016   History of IBS 07/14/2016   Osteopenia of multiple sites 07/14/2016   Fibromyalgia 01/13/2016   MGUS (monoclonal gammopathy of unknown significance) 12/23/2013   Chronic cholecystitis without calculus 10/18/2012  Abdominal pain, unspecified site 10/18/2012   Nausea alone 10/18/2012   Unspecified constipation 10/18/2012   Depression 10/18/2012   Anxiety    ASCUS (atypical squamous cells of undetermined significance) on Pap smear    IUD    Hemorrhoids 11/29/2010   Abdominal pain, left upper quadrant 11/29/2010    PCP:  Lupita Raider, MD  REFERRING PROVIDER: Lupita Raider, MD  REFERRING DIAG: fibromyalgia, TMJ dysfunction (added 04/20/22)  THERAPY DIAG:  Abnormal posture  Muscle weakness (generalized)  Cramp and spasm  Polymyalgia rheumatica (HCC)  Rationale for Evaluation and Treatment Rehabilitation  ONSET DATE: chronic pain (fibromyalgia) with flare-up 2 months ago  SUBJECTIVE:                                                                                                                                                                                                          SUBJECTIVE STATEMENT:      PERTINENT HISTORY:  Anxiety, bipolar disorder, depression, fibromyalgia, IBS, migraines, osteopenia, PTSD  PAIN:  Are you having pain: yes Pain location: 1-2/10, neck, shoulders TMJ 0/10- chewing sometimes just hurts  Pain description: irritating  Aggravating factors: doing too much Relieving factors: muscle relaxers, rest, stretching  PRECAUTIONS: Other: chronic pain syndrome and depression Other: slow progression with exercise due to chronic condition  WEIGHT BEARING RESTRICTIONS No  FALLS:  Has patient fallen in last 6 months? No  LIVING ENVIRONMENT: Lives with: lives with their family Lives in: House/apartment  OCCUPATION: on disability  PLOF: Independent with basic ADLs and Leisure: yardwork, walking  Pt cares for her 4 young grandchildren- difficulty with care including lifting and carrying  PATIENT GOALS be more active with less pain, exercise at the gym regularly, lift and carry grandchildren and laundry, sleep with fewer interruptions, get back to more gardening.  OBJECTIVE:   DIAGNOSTIC FINDINGS: none recent   PATIENT SURVEYS: Eval: Fibromyalgia Impact Questionnaire: first 2 sections only: 49 12/26/21: 40  02/28/22: 28 (first 2 sections only)  COGNITION: Overall cognitive status: Within functional limits for tasks assessed  SENSATION: WFL  POSTURE: rounded shoulders and forward head  PALPATION: Diffuse palpable tenderness over bil neck, upper traps, thoracic, lumbar and gluteals with trigger points.    CERVICAL ROM:   Active ROM A/PROM (deg) eval A/ROM 12/26/21 A/ROM 04/20/22 A/ROM 08/07/22  Flexion 55   60  Extension 40     Right lateral flexion 30 35 40 41  Left lateral flexion 35 40 45 50  Right rotation 50 60 65 70  Left rotation 40 65 65 75   (  Blank rows = not tested) TMJ: opening limited by 25%,  clicking with opening on the Rt.  Palpable tenderness over Rt masseter and at TMJ UPPER EXTREMITY ROM: UE A/ROM is limited by 20% into flexion and abduction with pain at end range.  Hip flexibility is limited by 25% in all directions with pain in all directions.  UPPER EXTREMITY MMT: UE: 4+/5, LE 4+/5 except hip flexors 4-/5  TODAY'S TREATMENT:   08/30/22:Pt arrives for aquatic physical therapy. Treatment took place in 3.5-5.5 feet of water. Water temperature was 91 degrees F. Pt entered the pool via stairs reciprocally with mild use of rails. Pt requires buoyancy of water for support and to offload joints with strengthening exercises. Pt utilizes viscosity of the water required for strengthening. Seated water bench with 75% submersion Pt performed seated LE AROM exercises 20x in all planes,   Standing in 50%- 75% depth water pt performed water walking in all 4 directions 10x with ankle fins added for walking duration. VC for speed in order to generate appropriate current for resistance and/or UE movements. UE single buoy wts used for push/pull with UE. 50% depth standing: knee ext Bil 12x2 with small noodle, hip 3 ways with ankle fins 12x required some UE for balance today, core/lat press 20x with double buoy wts. Underwater bicycle 5 min with noodle in horseback.  08/25/22:Pt arrives for aquatic physical therapy. Treatment took place in 3.5-5.5 feet of water. Water temperature was 91 degrees F. Pt entered the pool via stairs reciprocally with mild use of rails. Pt requires buoyancy of water for support and to offload joints with strengthening exercises. Pt utilizes viscosity of the water required for strengthening. Seated water bench with 75% submersion Pt performed seated LE AROM exercises 20x in all planes,   Standing in 50%- 75% depth water pt performed water walking in all 4 directions 10x with ankle fins added for walking duration. VC for speed in order to generate appropriate current for  resistance and/or UE movements. UE single buoy wts used for push/pull with UE. 50% depth standing: knee ext Bil 12x2 with small noodle, hip 3 ways with ankle fins 12x required some UE for balance today, core/lat press 20x with double buoy wts. Underwater bicycle 5 min with noodle in horseback.  08/24/22: NuStep: Level 3 x 10 minutes for endurance and mobility-PT present to discuss progress. Ball roll outs forward and lateral x10 each Sit to stand 5# kettle bell 2x10 Seated on ball: 3 way raises with 2# weights 2x10   Green theraband: rows and shoulder extension 2x10 Seated figure 4 stretch and hamstring stretch 3x 20 seconds bil  08/17/22: NuStep: Level 3 x 10 minutes for endurance and mobility-PT present to discuss progress. Ball roll outs forward and lateral x10 each Seated on ball: 3 way raises with 2# weights x10   Green theraband: rows and shoulder extension 2x10 Trigger Point Dry-Needling  Treatment instructions: Expect mild to moderate muscle soreness. S/S of pneumothorax if dry needled over a lung field, and to seek immediate medical attention should they occur. Patient verbalized understanding of these instructions and education.  Patient Consent Given: Yes Education handout provided: Previously provided Muscles treated: bil cervical paraspinals, suboccipitals, upper traps Treatment response/outcome: Utilized skilled palpation to identify trigger points.  During dry needling able to palpate muscle twitch and muscle elongation  Elongation and release to neck and upper traps Skilled palpation and monitoring by PT during dry needling   HOME EXERCISE PROGRAM: Access Code: [email or web address] Access Code:  [email or web address] URL: [email or web address] Date: 04/20/2022 Prepared by: Tresa Endo  Exercises - - Controlled Jaw Opening with Tongue on Roof of Mouth  - 3 x daily - 7 x weekly - 1 sets - 10 reps - Jaw Protraction  - 3 x daily - 7 x weekly - 1 sets - 10 reps ASSESSMENT:  CLINICAL  IMPRESSION:     OBJECTIVE IMPAIRMENTS decreased activity tolerance, decreased endurance, difficulty walking, decreased strength, increased muscle spasms, impaired flexibility, improper body mechanics, postural dysfunction, and pain.   ACTIVITY LIMITATIONS carrying, lifting, sitting, standing, reach over head, hygiene/grooming, and locomotion level  PARTICIPATION LIMITATIONS: meal prep, cleaning, laundry, driving, community activity, and yard work  PERSONAL FACTORS Past/current experiences, Time since onset of injury/illness/exacerbation, and 3+ comorbidities: fibromyalgia depression, anxiety,   are also affecting patient's functional outcome.   REHAB POTENTIAL: Good  CLINICAL DECISION MAKING: Evolving/moderate complexity  EVALUATION COMPLEXITY: Moderate   GOALS: Goals reviewed with patient? Yes  SHORT TERM GOALS: Target date: 03/20/22  Return to regular performance of HEP 2-3x/day to improve mobility  Baseline: stretching 2x/day (02/28/22) Goal status: In progress  2.  Report > or = to 25% reduction in cervical and lumbar pain with ADLs and self-care  Baseline: 20% better overall (02/28/2022) Goal status: in progress   3.  Reduce fibromyalgia impact score to < or = to 39  Baseline: 28 (02/28/22) Goal status: MET    LONG TERM GOALS: Target date: 09/29/22  Be independent in advanced HEP Baseline: gentle exercise and able to progress depending on pain level (08/07/22) Goal status: In progress   2.  Reduce fibromyalgia impact questionnaire to < or = to 21 Baseline: 28 (02/28/22) Goal status: Revised   3.  Lift and carry laundry and her grandchildren with > or = to 70% reduction in pain Baseline: shoulder and neck and low back 50% (08/07/22) Goal status: In progress   4.  Report > or = to 60% reduction in neck and lumbar pain with ADLs and self-care  Baseline: 50% (08/07/22) Goal status: revised   5.  Return to yardwork verbalize appropriate rest breaks and mechanics  Baseline:  has been able to do gentle things, will work to do more (06/12/22) Goal status: In progress    6. Report > or = to 30% reduction in Rt jaw pain with chewing and talking   Baseline  pain is resolved at this time (06/12/22)  Goal Status: MET PLAN: PT FREQUENCY: 2x/week  PT DURATION: 8 weeks  PLANNED INTERVENTIONS: Therapeutic exercises, Therapeutic activity, Neuromuscular re-education, Balance training, Gait training, Patient/Family education, Self Care, Joint mobilization, Aquatic Therapy, Dry Needling, Spinal manipulation, Spinal mobilization, Cryotherapy, Moist heat, Taping, Traction, Manual therapy, and Re-evaluation  PLAN FOR NEXT SESSION: continue aquatics, manual therapy to address pain, strength progression  Ane Payment, PTA 08/29/22 6:38 PM

## 2022-08-29 NOTE — Progress Notes (Signed)
                Danielle Bruce Danielle Javarious Elsayed, LCSW 

## 2022-08-30 ENCOUNTER — Ambulatory Visit: Payer: Medicare Other | Admitting: Physical Therapy

## 2022-08-30 DIAGNOSIS — R252 Cramp and spasm: Secondary | ICD-10-CM

## 2022-08-30 DIAGNOSIS — M6281 Muscle weakness (generalized): Secondary | ICD-10-CM

## 2022-08-30 DIAGNOSIS — R293 Abnormal posture: Secondary | ICD-10-CM

## 2022-08-30 DIAGNOSIS — M353 Polymyalgia rheumatica: Secondary | ICD-10-CM

## 2022-09-05 ENCOUNTER — Ambulatory Visit: Payer: Medicare Other | Admitting: Psychology

## 2022-09-05 DIAGNOSIS — H52203 Unspecified astigmatism, bilateral: Secondary | ICD-10-CM | POA: Diagnosis not present

## 2022-09-05 DIAGNOSIS — H524 Presbyopia: Secondary | ICD-10-CM | POA: Diagnosis not present

## 2022-09-05 DIAGNOSIS — H31003 Unspecified chorioretinal scars, bilateral: Secondary | ICD-10-CM | POA: Diagnosis not present

## 2022-09-05 DIAGNOSIS — H25013 Cortical age-related cataract, bilateral: Secondary | ICD-10-CM | POA: Diagnosis not present

## 2022-09-05 DIAGNOSIS — H25043 Posterior subcapsular polar age-related cataract, bilateral: Secondary | ICD-10-CM | POA: Diagnosis not present

## 2022-09-05 DIAGNOSIS — H43813 Vitreous degeneration, bilateral: Secondary | ICD-10-CM | POA: Diagnosis not present

## 2022-09-05 DIAGNOSIS — H2513 Age-related nuclear cataract, bilateral: Secondary | ICD-10-CM | POA: Diagnosis not present

## 2022-09-05 DIAGNOSIS — H5213 Myopia, bilateral: Secondary | ICD-10-CM | POA: Diagnosis not present

## 2022-09-06 ENCOUNTER — Ambulatory Visit: Payer: Medicare Other | Attending: Family Medicine

## 2022-09-06 DIAGNOSIS — M6281 Muscle weakness (generalized): Secondary | ICD-10-CM | POA: Insufficient documentation

## 2022-09-06 DIAGNOSIS — M353 Polymyalgia rheumatica: Secondary | ICD-10-CM | POA: Diagnosis not present

## 2022-09-06 DIAGNOSIS — M542 Cervicalgia: Secondary | ICD-10-CM | POA: Insufficient documentation

## 2022-09-06 DIAGNOSIS — F411 Generalized anxiety disorder: Secondary | ICD-10-CM | POA: Diagnosis not present

## 2022-09-06 DIAGNOSIS — R293 Abnormal posture: Secondary | ICD-10-CM | POA: Diagnosis not present

## 2022-09-06 DIAGNOSIS — R5383 Other fatigue: Secondary | ICD-10-CM | POA: Diagnosis not present

## 2022-09-06 DIAGNOSIS — M797 Fibromyalgia: Secondary | ICD-10-CM | POA: Diagnosis not present

## 2022-09-06 DIAGNOSIS — R252 Cramp and spasm: Secondary | ICD-10-CM | POA: Diagnosis not present

## 2022-09-06 DIAGNOSIS — E538 Deficiency of other specified B group vitamins: Secondary | ICD-10-CM | POA: Diagnosis not present

## 2022-09-06 DIAGNOSIS — J309 Allergic rhinitis, unspecified: Secondary | ICD-10-CM | POA: Diagnosis not present

## 2022-09-06 DIAGNOSIS — R6889 Other general symptoms and signs: Secondary | ICD-10-CM | POA: Diagnosis not present

## 2022-09-06 DIAGNOSIS — K589 Irritable bowel syndrome without diarrhea: Secondary | ICD-10-CM | POA: Diagnosis not present

## 2022-09-06 DIAGNOSIS — G43909 Migraine, unspecified, not intractable, without status migrainosus: Secondary | ICD-10-CM | POA: Diagnosis not present

## 2022-09-06 DIAGNOSIS — H269 Unspecified cataract: Secondary | ICD-10-CM | POA: Diagnosis not present

## 2022-09-06 NOTE — Therapy (Signed)
OUTPATIENT PHYSICAL THERAPY TREATMENT   Patient Name: Danielle Bruce MRN: [id number] DOB:01-18-1961, 62 y.o., female Today's Date: 09/06/2022      PT End of Session - 09/06/22 1142     Visit Number 53    Date for PT Re-Evaluation 09/29/22    Authorization Type Medicare B- KX now    Progress Note Due on Visit 58    PT Start Time 1105    PT Stop Time 1145    PT Time Calculation (min) 40 min    Activity Tolerance Patient tolerated treatment well    Behavior During Therapy [facility] for tasks assessed/performed                                                      Past Medical History:  Diagnosis Date   Abdominal pain, unspecified site 10/18/2012   Anemia    Anxiety    Arthritis    osteoarthritis   ASCUS (atypical squamous cells of undetermined significance) on Pap smear 05/06/2005   NEG HIGH RISK HPV--C&B BIOPSY BENIGN 12/2005   Asthma    Bipolar 2 disorder (HCC)    Cancer (HCC)    skin cancer - basal cell   Colon polyps    Complication of anesthesia    anxious afterwards, will get headaches    Constipation    Depression    Fibromyalgia 10/2013   GERD (gastroesophageal reflux disease)    Hearing loss on left    Heart murmur    never had any problems   Hemorrhoids    High cholesterol    High risk HPV infection 08/2011   cytology negative   IBS (irritable bowel syndrome)    Insomnia    Lymphocytic colitis    Lymphoma (HCC)    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   Bone marrow biopsy showes 8% plasma cells IgA Lambda   Migraines    Nausea alone 10/18/2012   Osteoarthritis    Osteopenia    Peripheral neuropathy    PTSD (post-traumatic stress disorder)    Past Surgical History:  Procedure Laterality Date   BONE MARROW BIOPSY Left 12/18/2013   Plasma cell dyscrasia 8% population of plasma cells   BREAST BIOPSY Right    benign stereo   CESAREAN SECTION  08,65,78   CHOLECYSTECTOMY N/A 10/16/2012    Procedure: LAPAROSCOPIC CHOLECYSTECTOMY;  Surgeon: Clovis Pu. Cornett, MD;  Location: [facility];  Service: General;  Laterality: N/A;   COLONOSCOPY     numerous times   DILATION AND CURETTAGE OF UTERUS     ESOPHAGOGASTRODUODENOSCOPY     HEMORRHOID SURGERY  1993   x3   IUD REMOVAL  02/2015   Mirena   LAPAROSCOPIC LYSIS OF ADHESIONS N/A 10/16/2012   Procedure: LAPAROSCOPIC LYSIS OF ADHESIONS;  Surgeon: Maisie Fus A. Cornett, MD;  Location: [facility];  Service: General;  Laterality: N/A;   LAPAROSCOPY N/A 10/16/2012   Procedure: LAPAROSCOPY DIAGNOSTIC;  Surgeon: Clovis Pu. Cornett, MD;  Location: [facility];  Service: General;  Laterality: N/A;   PELVIC LAPAROSCOPY     RADIOLOGY WITH ANESTHESIA N/A 12/16/2015   Procedure: MRI OF BRAIN WITH AND WITHOUT CONTRAST;  Surgeon: Medication Radiologist, MD;  Location: [facility];  Service: Radiology;  Laterality: N/A;   SHOULDER SURGERY  2007/2008   SPINE SURGERY  2010   cervical  Patient Active Problem List   Diagnosis Date Noted   GAD (generalized anxiety disorder) 12/26/2017   OCD (obsessive compulsive disorder) 12/26/2017   PTSD (post-traumatic stress disorder) 12/26/2017   DDD (degenerative disc disease), cervical 07/14/2016   Primary osteoarthritis of both feet 07/14/2016   Primary osteoarthritis of both hands 07/14/2016   Other fatigue 07/14/2016   History of IBS 07/14/2016   Osteopenia of multiple sites 07/14/2016   Fibromyalgia 01/13/2016   MGUS (monoclonal gammopathy of unknown significance) 12/23/2013   Chronic cholecystitis without calculus 10/18/2012   Abdominal pain, unspecified site 10/18/2012   Nausea alone 10/18/2012   Unspecified constipation 10/18/2012   Depression 10/18/2012   Anxiety    ASCUS (atypical squamous cells of undetermined significance) on Pap smear    IUD    Hemorrhoids 11/29/2010   Abdominal pain, left upper quadrant 11/29/2010    PCP:  Lupita Raider, MD  REFERRING PROVIDER: Lupita Raider, MD  REFERRING DIAG:  fibromyalgia, TMJ dysfunction (added 04/20/22)  THERAPY DIAG:  Abnormal posture  Muscle weakness (generalized)  Cramp and spasm  Cervicalgia  Polymyalgia rheumatica (HCC)  Rationale for Evaluation and Treatment Rehabilitation  ONSET DATE: chronic pain (fibromyalgia) with flare-up 2 months ago  SUBJECTIVE:                                                                                                                                                                                                         SUBJECTIVE STATEMENT:   I have been off for the past couple of weeks.  After I was sick, my fibromyalgia symptoms flared up.     PERTINENT HISTORY:  Anxiety, bipolar disorder, depression, fibromyalgia, IBS, migraines, osteopenia, PTSD  PAIN:  Are you having pain: yes Pain location: 1-2/10, neck, shoulders TMJ 0/10- chewing sometimes just hurts  Pain description: irritating  Aggravating factors: doing too much Relieving factors: muscle relaxers, rest, stretching  PRECAUTIONS: Other: chronic pain syndrome and depression Other: slow progression with exercise due to chronic condition  WEIGHT BEARING RESTRICTIONS No  FALLS:  Has patient fallen in last 6 months? No  LIVING ENVIRONMENT: Lives with: lives with their family Lives in: House/apartment  OCCUPATION: on disability  PLOF: Independent with basic ADLs and Leisure: yardwork, walking  Pt cares for her 4 young grandchildren- difficulty with care including lifting and carrying  PATIENT GOALS be more active with less pain, exercise at the gym regularly, lift and carry grandchildren and laundry, sleep with fewer interruptions, get back to more gardening.  OBJECTIVE:   DIAGNOSTIC FINDINGS: none recent   PATIENT SURVEYS: Eval:  Fibromyalgia Impact Questionnaire: first 2 sections only: 49 12/26/21: 40  02/28/22: 28 (first 2 sections only)  COGNITION: Overall cognitive status: Within functional limits for tasks  assessed  SENSATION: WFL  POSTURE: rounded shoulders and forward head  PALPATION: Diffuse palpable tenderness over bil neck, upper traps, thoracic, lumbar and gluteals with trigger points.    CERVICAL ROM:   Active ROM A/PROM (deg) eval A/ROM 12/26/21 A/ROM 04/20/22 A/ROM 08/07/22  Flexion 55   60  Extension 40     Right lateral flexion 30 35 40 41  Left lateral flexion 35 40 45 50  Right rotation 50 60 65 70  Left rotation 40 65 65 75   (Blank rows = not tested) TMJ: opening limited by 25%, clicking with opening on the Rt.  Palpable tenderness over Rt masseter and at TMJ UPPER EXTREMITY ROM: UE A/ROM is limited by 20% into flexion and abduction with pain at end range.  Hip flexibility is limited by 25% in all directions with pain in all directions.  UPPER EXTREMITY MMT: UE: 4+/5, LE 4+/5 except hip flexors 4-/5  TODAY'S TREATMENT:  09/06/22: NuStep: Level 3 x 10 minutes for endurance and mobility-PT present to discuss progress. Ball roll outs forward and lateral x10 each Sit to stand 5# kettle bell 2x10 Seated on ball: 3 way raises with 2# weights 2x10   Standing hip extension and abduction 2x10 at countertop- verbal cues for alignment Seated figure 4 stretch and hamstring stretch 3x 20 seconds bil   08/25/22:Pt arrives for aquatic physical therapy. Treatment took place in 3.5-5.5 feet of water. Water temperature was 91 degrees F. Pt entered the pool via stairs reciprocally with mild use of rails. Pt requires buoyancy of water for support and to offload joints with strengthening exercises. Pt utilizes viscosity of the water required for strengthening. Seated water bench with 75% submersion Pt performed seated LE AROM exercises 20x in all planes,   Standing in 50%- 75% depth water pt performed water walking in all 4 directions 10x with ankle fins added for walking duration. VC for speed in order to generate appropriate current for resistance and/or UE movements. UE single  buoy wts used for push/pull with UE. 50% depth standing: knee ext Bil 12x2 with small noodle, hip 3 ways with ankle fins 12x required some UE for balance today, core/lat press 20x with double buoy wts. Underwater bicycle 5 min with noodle in horseback.  08/24/22: NuStep: Level 3 x 10 minutes for endurance and mobility-PT present to discuss progress. Ball roll outs forward and lateral x10 each Sit to stand 5# kettle bell 2x10 Seated on ball: 3 way raises with 2# weights 2x10   Green theraband: rows and shoulder extension 2x10 Seated figure 4 stretch and hamstring stretch 3x 20 seconds bil   HOME EXERCISE PROGRAM: Access Code: [email or web address] Access Code: [email or web address] URL: [email or web address] Date: 09/06/2022 Prepared by: Tresa Endo  - Standing Hip Abduction with Counter Support  - 1 x daily - 7 x weekly - 1-2 sets - 10 reps - Standing Hip Extension with Chair  - 1 x daily - 7 x weekly - 1-2 sets - 10 reps ASSESSMENT:  CLINICAL IMPRESSION:  Pt has been not feeling well due to flare of fibromyalgia and was receptive to exercise today.  She did very well with all exercises in the clinic and required tactile and verbal cues for alignment with standing hip abduction and extension.  Pt is going to the beach next week and PT advised pt to  continue with her HEP and she has access to a pool for aquatic exercises.  Patient will benefit from skilled PT to address the below impairments and improve overall function.   OBJECTIVE IMPAIRMENTS decreased activity tolerance, decreased endurance, difficulty walking, decreased strength, increased muscle spasms, impaired flexibility, improper body mechanics, postural dysfunction, and pain.   ACTIVITY LIMITATIONS carrying, lifting, sitting, standing, reach over head, hygiene/grooming, and locomotion level  PARTICIPATION LIMITATIONS: meal prep, cleaning, laundry, driving, community activity, and yard work  PERSONAL FACTORS Past/current experiences, Time since  onset of injury/illness/exacerbation, and 3+ comorbidities: fibromyalgia depression, anxiety,   are also affecting patient's functional outcome.   REHAB POTENTIAL: Good  CLINICAL DECISION MAKING: Evolving/moderate complexity  EVALUATION COMPLEXITY: Moderate   GOALS: Goals reviewed with patient? Yes  SHORT TERM GOALS: Target date: 03/20/22  Return to regular performance of HEP 2-3x/day to improve mobility  Baseline: stretching 2x/day (02/28/22) Goal status: In progress  2.  Report > or = to 25% reduction in cervical and lumbar pain with ADLs and self-care  Baseline: 20% better overall (02/28/2022) Goal status: in progress   3.  Reduce fibromyalgia impact score to < or = to 39  Baseline: 28 (02/28/22) Goal status: MET    LONG TERM GOALS: Target date: 09/29/22  Be independent in advanced HEP Baseline: gentle exercise and able to progress depending on pain level (08/07/22) Goal status: In progress   2.  Reduce fibromyalgia impact questionnaire to < or = to 21 Baseline: 28 (02/28/22) Goal status: Revised   3.  Lift and carry laundry and her grandchildren with > or = to 70% reduction in pain Baseline: shoulder and neck and low back 50% (08/07/22) Goal status: In progress   4.  Report > or = to 60% reduction in neck and lumbar pain with ADLs and self-care  Baseline: 50% (08/07/22) Goal status: revised   5.  Return to yardwork verbalize appropriate rest breaks and mechanics  Baseline: has been able to do gentle things, will work to do more (06/12/22) Goal status: In progress    6. Report > or = to 30% reduction in Rt jaw pain with chewing and talking   Baseline  pain is resolved at this time (06/12/22)  Goal Status: MET PLAN: PT FREQUENCY: 2x/week  PT DURATION: 8 weeks  PLANNED INTERVENTIONS: Therapeutic exercises, Therapeutic activity, Neuromuscular re-education, Balance training, Gait training, Patient/Family education, Self Care, Joint mobilization, Aquatic Therapy, Dry Needling,  Spinal manipulation, Spinal mobilization, Cryotherapy, Moist heat, Taping, Traction, Manual therapy, and Re-evaluation  PLAN FOR NEXT SESSION: continue aquatics, manual therapy to address pain, strength progression  Lorrene Reid, PT 09/06/22 12:20 PM

## 2022-09-07 ENCOUNTER — Ambulatory Visit (INDEPENDENT_AMBULATORY_CARE_PROVIDER_SITE_OTHER): Payer: Medicare Other | Admitting: Psychology

## 2022-09-07 DIAGNOSIS — F4310 Post-traumatic stress disorder, unspecified: Secondary | ICD-10-CM | POA: Diagnosis not present

## 2022-09-07 DIAGNOSIS — F331 Major depressive disorder, recurrent, moderate: Secondary | ICD-10-CM

## 2022-09-07 DIAGNOSIS — F428 Other obsessive-compulsive disorder: Secondary | ICD-10-CM

## 2022-09-07 DIAGNOSIS — F902 Attention-deficit hyperactivity disorder, combined type: Secondary | ICD-10-CM | POA: Diagnosis not present

## 2022-09-07 NOTE — Progress Notes (Signed)
[facility] Counselor/Therapist Progress Note  Patient ID: BAE KUKURA, MRN: [id number],    Date: 09/07/2022  Time Spent: 60 minutes Time In:  2:00  Time out: 3:08  Treatment Type: Individual Therapy  Reported Symptoms: flashbacks, responding to triggers, depression, anxiety, being busy all of the time, dissociation  Mental Status Exam: Appearance:  Casual     Behavior: Appropriate  Motor: Restlestness  Speech/Language:  Clear and Coherent  Affect: Blunt  Mood: anxious  Thought process: normal  Thought content:   WNL  Sensory/Perceptual disturbances:   WNL  Orientation: oriented to person, place, time/date, and situation  Attention: Good  Concentration: Good  Memory: WNL  Fund of knowledge:  Good  Insight:   Good  Judgment:  Fair  Impulse Control: Good   Risk Assessment: Danger to Self:  No Self-injurious Behavior: No Danger to Others: No Duty to Warn:no Physical Aggression / Violence:No  Access to Firearms a concern: No  Gang Involvement:No   Subjective: The patient attended a face-to-face individual therapy session via video visit.  The patient gave verbal consent for the session to be on caregility and is aware of the limitations of telehealth.  The patient was in her home alone and therapist was in the office.  The patient reports that she is getting ready to go to the beach next week.  We talked about who is going and apparently several members of her family are going.  Apparently they have a beach trip yearly.  The patient reports that she feels like she is doing a little better than she was the last week.  We talked today specifically about reacting versus responding.  We talked about her reacting out of the trauma that she experienced when she was little.  The patient thinks that she was a bad mother.  We talked about her children still being involved with her and that her children turned out well.  We also talked about her being human and that it  seems that she takes on things as if it she did it wrong and we need talked about the need to balance that out a bit.  I reframed how to look at it differently for the patient.  We talked about the need for her to think of it as lessons that her children needed to learn by having the parents she had.  I explained that her method may not have been great but that her intention was always for her children to turn out well and to be loved.  I explained to her that the most important thing was that she loves her children.  We still need to do EMDR around the trauma and shame that the patient feels and I helped identify for her the difference between shame and guilt.  We will continue to work towards helping the patient be less critical of herself and less reactive.    Interventions: Cognitive Behavioral Therapy, Mindfulness Meditation, Eye Movement Desensitization and Reprocessing (EMDR), Insight-Oriented, and Interpersonal, psychoeducation  Diagnosis:Attention deficit hyperactivity disorder (ADHD), combined type  Major depressive disorder, recurrent episode, moderate (HCC)  PTSD (post-traumatic stress disorder)  Obsessional thoughts  Plan: Plan of Care: Client Abilities/Strengths  Insightful, motivated, Supportive family Client Treatment Preferences  Outpatient Individual therapy/EMDR  Client Statement of Needs  "I think I have PTSD and I feel like I need another kind of therapy than talk therapy" Treatment Level  Outpatient Individual therapy  Symptoms  Demonstrates an exaggerated startle response.:  (Status: maintained). Depressed  or irritable mood.: (Status:maintained). Describes a reliving of the event,  particularly through dissociative flashbacks.: (Status: maintained). Displays a  significant decline in interest and engagement in activities.:  (Status: maintained). Displays significant psychological and/or physiological distress resulting from internal and external  clues that are  reminiscent of the traumatic event.: (Status: maintained).  Experiences disturbances in sleep.:  (Status: maintained). Experiences disturbing  and persistent thoughts, images, and/or perceptions of the traumatic event.:  (Status: maintained). Experiences frequent nightmares.: (Status: maintained).  Feelings of hopelessness, worthlessness, or inappropriate guilt.: No Description Entered (Status:  improved). Has been exposed to a traumatic event involving actual or perceived threat of death or  serious injury.:  (Status: maintained). Impairment in social, occupational, or  other areas of functioning.:(Status: maintained). Intentionally avoids activities,  places, people, or objects (e.g., up-armored vehicles) that evoke memories of the event.: (Status: maintained). Intentionally avoids thoughts, feelings, or discussions related  to the traumatic event.: (Status: maintained). Reports difficulty concentrating as  well as feelings of guilt (Status:maintained). Reports response of intense fear,  helplessness, or horror to the traumatic event.:  (Status: maintained).  Problems Addressed  Unipolar Depression, Posttraumatic Stress Disorder (PTSD), Posttraumatic Stress Disorder (PTSD),   Posttraumatic Stress Disorder (PTSD), Posttraumatic Stress Disorder (PTSD)  Goals 1. Develop healthy thinking patterns and beliefs about self, others, and the world that lead to the alleviation and help prevent the relapse of  depression. Objective Identify and replace thoughts and beliefs that support depression. Target Date: 2023/01/04 Frequency: Weekly Progress: 20 Modality: individual Related Interventions 1. Explore and restructure underlying assumptions and beliefs reflected in biased self-talk that  may put the client at risk for relapse or recurrence. 2. Conduct Cognitive-Behavioral Therapy (see Cognitive Behavior Therapy by Reola Calkins; Overcoming Depression by Agapito Games al.), beginning with helping the client  learn the connection among  cognition, depressive feelings, and actions. 2. Eliminate or reduce the negative impact trauma related symptoms have  on social, occupational, and family functioning. Objective Learn and implement personal skills to manage challenging situations related to trauma. Target Date: 2023/01/04 Frequency: Weekly Progress: 0 Modality: individual 3. No longer avoids persons, places, activities, and objects that are  reminiscent of the traumatic event. Objective Participate in Eye Movement Desensitization and Reprocessing (EMDR) to reduce emotional distress  related to traumatic thoughts, feelings, and images. Target Date: 2023/01/04 Frequency: Weekly Progress: 0 Modality: individual   Related Interventions 1. Utilize Eye Movement Desensitization and Reprocessing (EMDR) to reduce the client's  emotional reactivity to the traumatic event and reduce PTSD symptoms. Objective Learn and implement guided self-dialogue to manage thoughts, feelings, and urges brought on by  encounters with trauma-related situations. Target Date: 2024-11/07 Frequency: Weekly Progress: 10 Modality: individual Related Interventions 1. Teach the client a guided self-dialogue procedure in which he/she learns to recognize  maladaptive self-talk, challenges its biases, copes with engendered feelings, overcomes  avoidance, and reinforces his/her accomplishments; review and reinforce progress, problemsolve obstacles. 4. No longer experiences intrusive event recollections, avoidance of event  reminders, intense arousal, or disinterest in activities or  relationships. 5. Thinks about or openly discusses the traumatic event with others  without experiencing psychological or physiological distress. Diagnosis Axis  none F43.10 (Posttraumatic stress disorder) - Open - [Signifier: n/a] Posttraumatic Stress  Disorder  Conditions For Discharge Achievement of treatment goals and objectives   Shiv Shuey G  Malesha Suliman, LCSW

## 2022-09-08 DIAGNOSIS — D472 Monoclonal gammopathy: Secondary | ICD-10-CM | POA: Diagnosis not present

## 2022-09-08 DIAGNOSIS — E8809 Other disorders of plasma-protein metabolism, not elsewhere classified: Secondary | ICD-10-CM | POA: Diagnosis not present

## 2022-09-12 ENCOUNTER — Ambulatory Visit: Payer: Medicare Other | Admitting: Psychology

## 2022-09-13 ENCOUNTER — Ambulatory Visit: Payer: Medicare Other | Admitting: Physical Therapy

## 2022-09-18 ENCOUNTER — Ambulatory Visit (HOSPITAL_COMMUNITY)
Admission: RE | Admit: 2022-09-18 | Discharge: 2022-09-18 | Disposition: A | Discharge status: Home / Self Care | Payer: Medicare Other | Source: Ambulatory Visit | Attending: Family Medicine | Admitting: Family Medicine

## 2022-09-18 ENCOUNTER — Encounter (HOSPITAL_COMMUNITY): Payer: Self-pay

## 2022-09-18 ENCOUNTER — Ambulatory Visit: Payer: Medicare Other

## 2022-09-18 DIAGNOSIS — R293 Abnormal posture: Secondary | ICD-10-CM

## 2022-09-18 DIAGNOSIS — M542 Cervicalgia: Secondary | ICD-10-CM | POA: Diagnosis not present

## 2022-09-18 DIAGNOSIS — R252 Cramp and spasm: Secondary | ICD-10-CM

## 2022-09-18 DIAGNOSIS — M6281 Muscle weakness (generalized): Secondary | ICD-10-CM | POA: Diagnosis not present

## 2022-09-18 DIAGNOSIS — M353 Polymyalgia rheumatica: Secondary | ICD-10-CM

## 2022-09-18 DIAGNOSIS — E7800 Pure hypercholesterolemia, unspecified: Secondary | ICD-10-CM | POA: Insufficient documentation

## 2022-09-18 NOTE — Therapy (Addendum)
OUTPATIENT PHYSICAL THERAPY TREATMENT   Patient Name: SUETTA HOFFMEISTER MRN: [id number] DOB:[birth year], 62 y.o., female Today's Date: 09/18/2022      PT End of Session - 09/18/22 1059     Visit Number 54    Date for PT Re-Evaluation 11/22/22    Authorization Type Medicare B- KX now    Progress Note Due on Visit 58    PT Start Time 1021   late   PT Stop Time 1058    PT Time Calculation (min) 37 min    Activity Tolerance Patient tolerated treatment well    Behavior During Therapy [facility] for tasks assessed/performed                                                       Past Medical History:  Diagnosis Date   Abdominal pain, unspecified site 10/18/2012   Anemia    Anxiety    Arthritis    osteoarthritis   ASCUS (atypical squamous cells of undetermined significance) on Pap smear 05/06/2005   NEG HIGH RISK HPV--C&B BIOPSY BENIGN 12/2005   Asthma    Bipolar 2 disorder (HCC)    Cancer (HCC)    skin cancer - basal cell   Colon polyps    Complication of anesthesia    anxious afterwards, will get headaches    Constipation    Depression    Fibromyalgia 10/2013   GERD (gastroesophageal reflux disease)    Hearing loss on left    Heart murmur    never had any problems   Hemorrhoids    High cholesterol    High risk HPV infection 08/2011   cytology negative   IBS (irritable bowel syndrome)    Insomnia    Lymphocytic colitis    Lymphoma (HCC)    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   Bone marrow biopsy showes 8% plasma cells IgA Lambda   Migraines    Nausea alone 10/18/2012   Osteoarthritis    Osteopenia    Peripheral neuropathy    PTSD (post-traumatic stress disorder)    Past Surgical History:  Procedure Laterality Date   BONE MARROW BIOPSY Left 12/18/2013   Plasma cell dyscrasia 8% population of plasma cells   BREAST BIOPSY Right    benign stereo   CESAREAN SECTION  01,02,72   CHOLECYSTECTOMY N/A  10/16/2012   Procedure: LAPAROSCOPIC CHOLECYSTECTOMY;  Surgeon: Clovis Pu. Cornett, MD;  Location: [facility];  Service: General;  Laterality: N/A;   COLONOSCOPY     numerous times   DILATION AND CURETTAGE OF UTERUS     ESOPHAGOGASTRODUODENOSCOPY     HEMORRHOID SURGERY  1993   x3   IUD REMOVAL  02/2015   Mirena   LAPAROSCOPIC LYSIS OF ADHESIONS N/A 10/16/2012   Procedure: LAPAROSCOPIC LYSIS OF ADHESIONS;  Surgeon: Maisie Fus A. Cornett, MD;  Location: [facility];  Service: General;  Laterality: N/A;   LAPAROSCOPY N/A 10/16/2012   Procedure: LAPAROSCOPY DIAGNOSTIC;  Surgeon: Clovis Pu. Cornett, MD;  Location: [facility];  Service: General;  Laterality: N/A;   PELVIC LAPAROSCOPY     RADIOLOGY WITH ANESTHESIA N/A 12/16/2015   Procedure: MRI OF BRAIN WITH AND WITHOUT CONTRAST;  Surgeon: Medication Radiologist, MD;  Location: [facility];  Service: Radiology;  Laterality: N/A;   SHOULDER SURGERY  2007/2008   SPINE SURGERY  2010  cervical   Patient Active Problem List   Diagnosis Date Noted   GAD (generalized anxiety disorder) 12/26/2017   OCD (obsessive compulsive disorder) 12/26/2017   PTSD (post-traumatic stress disorder) 12/26/2017   DDD (degenerative disc disease), cervical 07/14/2016   Primary osteoarthritis of both feet 07/14/2016   Primary osteoarthritis of both hands 07/14/2016   Other fatigue 07/14/2016   History of IBS 07/14/2016   Osteopenia of multiple sites 07/14/2016   Fibromyalgia 01/13/2016   MGUS (monoclonal gammopathy of unknown significance) 12/23/2013   Chronic cholecystitis without calculus 10/18/2012   Abdominal pain, unspecified site 10/18/2012   Nausea alone 10/18/2012   Unspecified constipation 10/18/2012   Depression 10/18/2012   Anxiety    ASCUS (atypical squamous cells of undetermined significance) on Pap smear    IUD    Hemorrhoids 11/29/2010   Abdominal pain, left upper quadrant 11/29/2010    PCP:  Lupita Raider, MD  REFERRING PROVIDER: Lupita Raider, MD  REFERRING  DIAG: fibromyalgia, TMJ dysfunction (added 04/20/22)  THERAPY DIAG:  Abnormal posture  Muscle weakness (generalized)  Cramp and spasm  Cervicalgia  Polymyalgia rheumatica (HCC)  Rationale for Evaluation and Treatment Rehabilitation  ONSET DATE: chronic pain (fibromyalgia) with flare-up 2 months ago  SUBJECTIVE:                                                                                                                                                                                                         SUBJECTIVE STATEMENT:  I am still in a fibromyalgia flare but I can tell I am at the end.  I've had low energy.  I was able to get out in my yard yesterday.       PERTINENT HISTORY:  Anxiety, bipolar disorder, depression, fibromyalgia, IBS, migraines, osteopenia, PTSD  PAIN:  Are you having pain: yes Pain location: 4/10, neck, shoulders TMJ 0/10- chewing sometimes just hurts  Pain description: irritating  Aggravating factors: doing too much Relieving factors: muscle relaxers, rest, stretching  PRECAUTIONS: Other: chronic pain syndrome and depression Other: slow progression with exercise due to chronic condition  WEIGHT BEARING RESTRICTIONS No  FALLS:  Has patient fallen in last 6 months? No  LIVING ENVIRONMENT: Lives with: lives with their family Lives in: House/apartment  OCCUPATION: on disability  PLOF: Independent with basic ADLs and Leisure: yardwork, walking  Pt cares for her 4 young grandchildren- difficulty with care including lifting and carrying  PATIENT GOALS be more active with less pain, exercise at the gym regularly, lift and carry grandchildren and laundry, sleep with fewer interruptions, get back  to more gardening.  OBJECTIVE:   DIAGNOSTIC FINDINGS: none recent   PATIENT SURVEYS: Eval: Fibromyalgia Impact Questionnaire: first 2 sections only: 49 12/26/21: 40  02/28/22: 28 (first 2 sections only)  COGNITION: Overall cognitive status: Within  functional limits for tasks assessed  SENSATION: WFL  POSTURE: rounded shoulders and forward head  PALPATION: Diffuse palpable tenderness over bil neck, upper traps, thoracic, lumbar and gluteals with trigger points.    CERVICAL ROM:   Active ROM A/PROM (deg) eval A/ROM 12/26/21 A/ROM 04/20/22 A/ROM 08/07/22  Flexion 55   60  Extension 40     Right lateral flexion 30 35 40 41  Left lateral flexion 35 40 45 50  Right rotation 50 60 65 70  Left rotation 40 65 65 75   (Blank rows = not tested) TMJ: opening limited by 25%, clicking with opening on the Rt.  Palpable tenderness over Rt masseter and at TMJ UPPER EXTREMITY ROM: UE A/ROM is limited by 20% into flexion and abduction with pain at end range.  Hip flexibility is limited by 25% in all directions with pain in all directions.  UPPER EXTREMITY MMT: UE: 4+/5, LE 4+/5 except hip flexors 4-/5  TODAY'S TREATMENT:  09/18/22: NuStep: Level 3 x 10 minutes for endurance and mobility-PT present to discuss progress. Ball roll outs forward and lateral x10 each Seated on ball: 3 way raises with 2# weights 2x10  Trigger Point Dry-Needling  Treatment instructions: Expect mild to moderate muscle soreness. S/S of pneumothorax if dry needled over a lung field, and to seek immediate medical attention should they occur. Patient verbalized understanding of these instructions and education.  Patient Consent Given: Yes Education handout provided: Previously provided Muscles treated: bil cervical and thoracic multifidi, rhomboids, upper traps  Treatment response/outcome: Utilized skilled palpation to identify trigger points.  During dry needling able to palpate muscle twitch and muscle elongation  Elongation and release to bil neck and upper back. Skilled palpation and monitoring by PT during dry needling  09/06/22: NuStep: Level 3 x 10 minutes for endurance and mobility-PT present to discuss progress. Ball roll outs forward and lateral x10  each Sit to stand 5# kettle bell 2x10 Seated on ball: 3 way raises with 2# weights 2x10   Standing hip extension and abduction 2x10 at countertop- verbal cues for alignment Seated figure 4 stretch and hamstring stretch 3x 20 seconds bil   08/25/22:Pt arrives for aquatic physical therapy. Treatment took place in 3.5-5.5 feet of water. Water temperature was 91 degrees F. Pt entered the pool via stairs reciprocally with mild use of rails. Pt requires buoyancy of water for support and to offload joints with strengthening exercises. Pt utilizes viscosity of the water required for strengthening. Seated water bench with 75% submersion Pt performed seated LE AROM exercises 20x in all planes,   Standing in 50%- 75% depth water pt performed water walking in all 4 directions 10x with ankle fins added for walking duration. VC for speed in order to generate appropriate current for resistance and/or UE movements. UE single buoy wts used for push/pull with UE. 50% depth standing: knee ext Bil 12x2 with small noodle, hip 3 ways with ankle fins 12x required some UE for balance today, core/lat press 20x with double buoy wts. Underwater bicycle 5 min with noodle in horseback.    HOME EXERCISE PROGRAM: Access Code: [email or web address] Access Code: [email or web address] URL: [email or web address] Date: 09/06/2022 Prepared by: Tresa Endo  - Standing Hip Abduction with Counter Support  - 1 x daily -  7 x weekly - 1-2 sets - 10 reps - Standing Hip Extension with Chair  - 1 x daily - 7 x weekly - 1-2 sets - 10 reps ASSESSMENT:  CLINICAL IMPRESSION:  Pt has been not feeling well due to flare of fibromyalgia and was receptive to some exercise and manual therapy to address tissue mobility and trigger points.  She did very well with all exercises in the clinic without increased pain.  Good response to DN with multiple twitch responses and improved tissue mobility after. Pt with chronic pain and benefit from gentle progression of  exercise, aquatics and manual therapy to address.  Patient will benefit from skilled PT to address the below impairments and improve overall function.   09/27/22: Pt benefits from skilled PT to manage pain condition as this fluctuates and she requires modifications to her program.  Pt benefits from properties of water and has favorable response to weekly aquatics sessions to manage pain and mental health.  Pt is much more proactive in her health and is making attempts to maintain this level of activity even when faced with fibromyalgia and mental health set backs.    OBJECTIVE IMPAIRMENTS decreased activity tolerance, decreased endurance, difficulty walking, decreased strength, increased muscle spasms, impaired flexibility, improper body mechanics, postural dysfunction, and pain.   ACTIVITY LIMITATIONS carrying, lifting, sitting, standing, reach over head, hygiene/grooming, and locomotion level  PARTICIPATION LIMITATIONS: meal prep, cleaning, laundry, driving, community activity, and yard work  PERSONAL FACTORS Past/current experiences, Time since onset of injury/illness/exacerbation, and 3+ comorbidities: fibromyalgia depression, anxiety,   are also affecting patient's functional outcome.   REHAB POTENTIAL: Good  CLINICAL DECISION MAKING: Evolving/moderate complexity  EVALUATION COMPLEXITY: Moderate   GOALS: Goals reviewed with patient? Yes  SHORT TERM GOALS: Target date: 03/20/22  Return to regular performance of HEP 2-3x/day to improve mobility  Baseline: stretching 2x/day (02/28/22) Goal status: In progress  2.  Report > or = to 25% reduction in cervical and lumbar pain with ADLs and self-care  Baseline: 20% better overall (02/28/2022) Goal status: in progress   3.  Reduce fibromyalgia impact score to < or = to 39  Baseline: 28 (02/28/22) Goal status: MET    LONG TERM GOALS: Target date: 11/22/22   Be independent in advanced HEP Baseline: gentle exercise and able to progress  depending on pain level (08/07/22) Goal status: In progress   2.  Reduce fibromyalgia impact questionnaire to < or = to 21 Baseline: 28 (02/28/22) Goal status: Revised   3.  Lift and carry laundry and her grandchildren with > or = to 70% reduction in pain Baseline: shoulder and neck and low back 50% (09/27/22 stalled progress due to recent flare-up.  Was making steady progress prior to flare-up) Goal status: In progress   4.  Report > or = to 60% reduction in neck and lumbar pain with ADLs and self-care  Baseline: 50% (09/27/22-stalled progress due to recent flare-up.  Was making steady progress prior to flare-up) Goal status: revised   5.  Return to yardwork verbalize appropriate rest breaks and mechanics  Baseline: intermittently able to get in her yard and is learning to pace herself to avoid flare(09/27/22) Goal status: In progress    6. Report > or = to 30% reduction in Rt jaw pain with chewing and talking   Baseline  pain is resolved at this time (06/12/22)  Goal Status: MET PLAN: PT FREQUENCY: 2x/week  PT DURATION: 8 weeks  PLANNED INTERVENTIONS: Therapeutic exercises, Therapeutic activity, Neuromuscular  re-education, Balance training, Gait training, Patient/Family education, Self Care, Joint mobilization, Aquatic Therapy, Dry Needling, Spinal manipulation, Spinal mobilization, Cryotherapy, Moist heat, Taping, Traction, Manual therapy, and Re-evaluation  PLAN FOR NEXT SESSION: continue aquatics, manual therapy to address pain, strength progression  Lorrene Reid, PT 09/18/22 11:00 AM   Recertification complete 09/27/22-Jinny Sweetland, PT 09/27/22 5:39 PM

## 2022-09-19 ENCOUNTER — Ambulatory Visit (INDEPENDENT_AMBULATORY_CARE_PROVIDER_SITE_OTHER): Payer: Medicare Other | Admitting: Psychology

## 2022-09-19 DIAGNOSIS — F4310 Post-traumatic stress disorder, unspecified: Secondary | ICD-10-CM | POA: Diagnosis not present

## 2022-09-19 DIAGNOSIS — F331 Major depressive disorder, recurrent, moderate: Secondary | ICD-10-CM | POA: Diagnosis not present

## 2022-09-19 DIAGNOSIS — F411 Generalized anxiety disorder: Secondary | ICD-10-CM | POA: Diagnosis not present

## 2022-09-19 DIAGNOSIS — F428 Other obsessive-compulsive disorder: Secondary | ICD-10-CM | POA: Diagnosis not present

## 2022-09-19 DIAGNOSIS — F902 Attention-deficit hyperactivity disorder, combined type: Secondary | ICD-10-CM

## 2022-09-19 NOTE — Progress Notes (Signed)
[facility] Counselor/Therapist Progress Note  Patient ID: Danielle Bruce, MRN: [id number],    Date: 09/19/2022  Time Spent: 60 minutes Time In:  3:00  Time out: 4:00  Treatment Type: Individual Therapy  Reported Symptoms: flashbacks, responding to triggers, depression, anxiety, being busy all of the time, dissociation  Mental Status Exam: Appearance:  Casual     Behavior: Appropriate  Motor: Restlestness  Speech/Language:  Clear and Coherent  Affect: Blunt  Mood: anxious  Thought process: normal  Thought content:   WNL  Sensory/Perceptual disturbances:   WNL  Orientation: oriented to person, place, time/date, and situation  Attention: Good  Concentration: Good  Memory: WNL  Fund of knowledge:  Good  Insight:   Good  Judgment:  Fair  Impulse Control: Good   Risk Assessment: Danger to Self:  No Self-injurious Behavior: No Danger to Others: No Duty to Warn:no Physical Aggression / Violence:No  Access to Firearms a concern: No  Gang Involvement:No   Subjective: The patient attended a face-to-face individual therapy session via video visit.  The patient gave verbal consent for the session to be on caregility and is aware of the limitations of telehealth.  The patient was in her home alone and therapist was in the office.  The patient reports that she did not come in today because of the rain.  The patient states that she went to the beach with her family and they had a nice time.  The patient talked today about feeling frustrated because she does not have a direction that she is going in.  We talked about the need for her to start thinking about what she would like to happen in her life.  I asked her to do a vision board and explained to her that if she does not think about what it is she wants to have happen then that she has nothing that she is working towards.  We also talked about her not being as positive as she needs to be and I am working with her on not  talking about life in a negative way as much.  She seems to own her illnesses and seems to limit herself by this.  We talked about her needing to think about what she is saying or thinking so that she can change her thoughts.  Utilized cognitive behavioral therapy with patient today.  Interventions: Cognitive Behavioral Therapy, Mindfulness Meditation, Eye Movement Desensitization and Reprocessing (EMDR), Insight-Oriented, and Interpersonal, psychoeducation  Diagnosis:Attention deficit hyperactivity disorder (ADHD), combined type  PTSD (post-traumatic stress disorder)  Obsessional thoughts  Major depressive disorder, recurrent episode, moderate (HCC)  Generalized anxiety disorder  Plan: Plan of Care: Client Abilities/Strengths  Insightful, motivated, Supportive family Client Treatment Preferences  Outpatient Individual therapy/EMDR  Client Statement of Needs  "I think I have PTSD and I feel like I need another kind of therapy than talk therapy" Treatment Level  Outpatient Individual therapy  Symptoms  Demonstrates an exaggerated startle response.:  (Status: maintained). Depressed  or irritable mood.: (Status:maintained). Describes a reliving of the event,  particularly through dissociative flashbacks.: (Status: maintained). Displays a  significant decline in interest and engagement in activities.:  (Status: maintained). Displays significant psychological and/or physiological distress resulting from internal and external  clues that are reminiscent of the traumatic event.: (Status: maintained).  Experiences disturbances in sleep.:  (Status: maintained). Experiences disturbing  and persistent thoughts, images, and/or perceptions of the traumatic event.:  (Status: maintained). Experiences frequent nightmares.: (Status: maintained).  Feelings of  hopelessness, worthlessness, or inappropriate guilt.: No Description Entered (Status:  improved). Has been exposed to a traumatic event involving  actual or perceived threat of death or  serious injury.:  (Status: maintained). Impairment in social, occupational, or  other areas of functioning.:(Status: maintained). Intentionally avoids activities,  places, people, or objects (e.g., up-armored vehicles) that evoke memories of the event.: (Status: maintained). Intentionally avoids thoughts, feelings, or discussions related  to the traumatic event.: (Status: maintained). Reports difficulty concentrating as  well as feelings of guilt (Status:maintained). Reports response of intense fear,  helplessness, or horror to the traumatic event.:  (Status: maintained).  Problems Addressed  Unipolar Depression, Posttraumatic Stress Disorder (PTSD), Posttraumatic Stress Disorder (PTSD),   Posttraumatic Stress Disorder (PTSD), Posttraumatic Stress Disorder (PTSD)  Goals 1. Develop healthy thinking patterns and beliefs about self, others, and the world that lead to the alleviation and help prevent the relapse of  depression. Objective Identify and replace thoughts and beliefs that support depression. Target Date: 2023/01/04 Frequency: Weekly Progress: 20 Modality: individual Related Interventions 1. Explore and restructure underlying assumptions and beliefs reflected in biased self-talk that  may put the client at risk for relapse or recurrence. 2. Conduct Cognitive-Behavioral Therapy (see Cognitive Behavior Therapy by Reola Calkins; Overcoming Depression by Agapito Games al.), beginning with helping the client learn the connection among  cognition, depressive feelings, and actions. 2. Eliminate or reduce the negative impact trauma related symptoms have  on social, occupational, and family functioning. Objective Learn and implement personal skills to manage challenging situations related to trauma. Target Date: 2023/01/04 Frequency: Weekly Progress: 0 Modality: individual 3. No longer avoids persons, places, activities, and objects that are  reminiscent of  the traumatic event. Objective Participate in Eye Movement Desensitization and Reprocessing (EMDR) to reduce emotional distress  related to traumatic thoughts, feelings, and images. Target Date: 2023/01/04 Frequency: Weekly Progress: 0 Modality: individual   Related Interventions 1. Utilize Eye Movement Desensitization and Reprocessing (EMDR) to reduce the client's  emotional reactivity to the traumatic event and reduce PTSD symptoms. Objective Learn and implement guided self-dialogue to manage thoughts, feelings, and urges brought on by  encounters with trauma-related situations. Target Date: 2024-11/07 Frequency: Weekly Progress: 10 Modality: individual Related Interventions 1. Teach the client a guided self-dialogue procedure in which he/she learns to recognize  maladaptive self-talk, challenges its biases, copes with engendered feelings, overcomes  avoidance, and reinforces his/her accomplishments; review and reinforce progress, problemsolve obstacles. 4. No longer experiences intrusive event recollections, avoidance of event  reminders, intense arousal, or disinterest in activities or  relationships. 5. Thinks about or openly discusses the traumatic event with others  without experiencing psychological or physiological distress. Diagnosis Axis  none F43.10 (Posttraumatic stress disorder) - Open - [Signifier: n/a] Posttraumatic Stress  Disorder  Conditions For Discharge Achievement of treatment goals and objectives   Jennyfer Nickolson G Elo Marmolejos, LCSW

## 2022-09-20 ENCOUNTER — Ambulatory Visit: Payer: Medicare Other | Admitting: Physical Therapy

## 2022-09-20 NOTE — Therapy (Deleted)
OUTPATIENT PHYSICAL THERAPY TREATMENT   Patient Name: Danielle Bruce MRN: [id number] DOB:1960/12/21, 62 y.o., female Today's Date: 09/20/2022                                                  Past Medical History:  Diagnosis Date   Abdominal pain, unspecified site 10/18/2012   Anemia    Anxiety    Arthritis    osteoarthritis   ASCUS (atypical squamous cells of undetermined significance) on Pap smear 05/06/2005   NEG HIGH RISK HPV--C&B BIOPSY BENIGN 12/2005   Asthma    Bipolar 2 disorder (HCC)    Cancer (HCC)    skin cancer - basal cell   Colon polyps    Complication of anesthesia    anxious afterwards, will get headaches    Constipation    Depression    Fibromyalgia 10/2013   GERD (gastroesophageal reflux disease)    Hearing loss on left    Heart murmur    never had any problems   Hemorrhoids    High cholesterol    High risk HPV infection 08/2011   cytology negative   IBS (irritable bowel syndrome)    Insomnia    Lymphocytic colitis    Lymphoma (HCC)    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   Bone marrow biopsy showes 8% plasma cells IgA Lambda   Migraines    Nausea alone 10/18/2012   Osteoarthritis    Osteopenia    Peripheral neuropathy    PTSD (post-traumatic stress disorder)    Past Surgical History:  Procedure Laterality Date   BONE MARROW BIOPSY Left 12/18/2013   Plasma cell dyscrasia 8% population of plasma cells   BREAST BIOPSY Right    benign stereo   CESAREAN SECTION  78,29,56   CHOLECYSTECTOMY N/A 10/16/2012   Procedure: LAPAROSCOPIC CHOLECYSTECTOMY;  Surgeon: Clovis Pu. Cornett, MD;  Location: [facility];  Service: General;  Laterality: N/A;   COLONOSCOPY     numerous times   DILATION AND CURETTAGE OF UTERUS     ESOPHAGOGASTRODUODENOSCOPY     HEMORRHOID SURGERY  1993   x3   IUD REMOVAL  02/2015   Mirena   LAPAROSCOPIC LYSIS OF ADHESIONS N/A 10/16/2012   Procedure: LAPAROSCOPIC  LYSIS OF ADHESIONS;  Surgeon: Maisie Fus A. Cornett, MD;  Location: [facility];  Service: General;  Laterality: N/A;   LAPAROSCOPY N/A 10/16/2012   Procedure: LAPAROSCOPY DIAGNOSTIC;  Surgeon: Clovis Pu. Cornett, MD;  Location: [facility];  Service: General;  Laterality: N/A;   PELVIC LAPAROSCOPY     RADIOLOGY WITH ANESTHESIA N/A 12/16/2015   Procedure: MRI OF BRAIN WITH AND WITHOUT CONTRAST;  Surgeon: Medication Radiologist, MD;  Location: [facility];  Service: Radiology;  Laterality: N/A;   SHOULDER SURGERY  2007/2008   SPINE SURGERY  2010   cervical   Patient Active Problem List   Diagnosis Date Noted   GAD (generalized anxiety disorder) 12/26/2017   OCD (obsessive compulsive disorder) 12/26/2017   PTSD (post-traumatic stress disorder) 12/26/2017   DDD (degenerative disc disease), cervical 07/14/2016   Primary osteoarthritis of both feet 07/14/2016   Primary osteoarthritis of both hands 07/14/2016   Other fatigue 07/14/2016   History of IBS 07/14/2016   Osteopenia of multiple sites 07/14/2016   Fibromyalgia 01/13/2016   MGUS (monoclonal gammopathy of unknown significance) 12/23/2013   Chronic cholecystitis without  calculus 10/18/2012   Abdominal pain, unspecified site 10/18/2012   Nausea alone 10/18/2012   Unspecified constipation 10/18/2012   Depression 10/18/2012   Anxiety    ASCUS (atypical squamous cells of undetermined significance) on Pap smear    IUD    Hemorrhoids 11/29/2010   Abdominal pain, left upper quadrant 11/29/2010    PCP:  Lupita Raider, MD  REFERRING PROVIDER: Lupita Raider, MD  REFERRING DIAG: fibromyalgia, TMJ dysfunction (added 04/20/22)  THERAPY DIAG:  Abnormal posture  Muscle weakness (generalized)  Cramp and spasm  Cervicalgia  Polymyalgia rheumatica (HCC)  Rationale for Evaluation and Treatment Rehabilitation  ONSET DATE: chronic pain (fibromyalgia) with flare-up 2 months ago  SUBJECTIVE:                                                                                                                                                                                                          SUBJECTIVE STATEMENT:  .       PERTINENT HISTORY:  Anxiety, bipolar disorder, depression, fibromyalgia, IBS, migraines, osteopenia, PTSD  PAIN:  Are you having pain: yes Pain location: 4/10, neck, shoulders TMJ 0/10- chewing sometimes just hurts  Pain description: irritating  Aggravating factors: doing too much Relieving factors: muscle relaxers, rest, stretching  PRECAUTIONS: Other: chronic pain syndrome and depression Other: slow progression with exercise due to chronic condition  WEIGHT BEARING RESTRICTIONS No  FALLS:  Has patient fallen in last 6 months? No  LIVING ENVIRONMENT: Lives with: lives with their family Lives in: House/apartment  OCCUPATION: on disability  PLOF: Independent with basic ADLs and Leisure: yardwork, walking  Pt cares for her 4 young grandchildren- difficulty with care including lifting and carrying  PATIENT GOALS be more active with less pain, exercise at the gym regularly, lift and carry grandchildren and laundry, sleep with fewer interruptions, get back to more gardening.  OBJECTIVE:   DIAGNOSTIC FINDINGS: none recent   PATIENT SURVEYS: Eval: Fibromyalgia Impact Questionnaire: first 2 sections only: 49 12/26/21: 40  02/28/22: 28 (first 2 sections only)  COGNITION: Overall cognitive status: Within functional limits for tasks assessed  SENSATION: WFL  POSTURE: rounded shoulders and forward head  PALPATION: Diffuse palpable tenderness over bil neck, upper traps, thoracic, lumbar and gluteals with trigger points.    CERVICAL ROM:   Active ROM A/PROM (deg) eval A/ROM 12/26/21 A/ROM 04/20/22 A/ROM 08/07/22  Flexion 55   60  Extension 40     Right lateral flexion 30 35 40 41  Left lateral flexion 35 40 45 50  Right rotation 50 60 65 70  Left rotation 40 65 65 75   (Blank rows = not tested) TMJ:  opening limited by 25%, clicking with opening on the Rt.  Palpable tenderness over Rt masseter and at TMJ UPPER EXTREMITY ROM: UE A/ROM is limited by 20% into flexion and abduction with pain at end range.  Hip flexibility is limited by 25% in all directions with pain in all directions.  UPPER EXTREMITY MMT: UE: 4+/5, LE 4+/5 except hip flexors 4-/5  TODAY'S TREATMENT:   09/20/22:Pt arrives for aquatic physical therapy. Treatment took place in 3.5-5.5 feet of water. Water temperature was 90 degrees F. Pt entered the pool via stairs reciprocally with mild use of rails. Pt requires buoyancy of water for support and to offload joints with strengthening exercises. Pt utilizes viscosity of the water required for strengthening. Seated water bench with 75% submersion Pt performed seated LE AROM exercises 20x in all planes,   Standing in 50%- 75% depth water pt performed water walking in all 4 directions 10x with ankle fins added for walking duration. VC for speed in order to generate appropriate current for resistance and/or UE movements. UE single buoy wts used for push/pull with UE. 50% depth standing: knee ext Bil 12x2 with small noodle, hip 3 ways with ankle fins 12x required some UE for balance today, core/lat press 20x with double buoy wts. Underwater bicycle 5 min with noodle in horseback.   HOME EXERCISE PROGRAM: Access Code: [email or web address] Access Code: [email or web address] URL: [email or web address] Date: 09/06/2022 Prepared by: Tresa Endo  - Standing Hip Abduction with Counter Support  - 1 x daily - 7 x weekly - 1-2 sets - 10 reps - Standing Hip Extension with Chair  - 1 x daily - 7 x weekly - 1-2 sets - 10 reps ASSESSMENT:  CLINICAL IMPRESSION:    OBJECTIVE IMPAIRMENTS decreased activity tolerance, decreased endurance, difficulty walking, decreased strength, increased muscle spasms, impaired flexibility, improper body mechanics, postural dysfunction, and pain.   ACTIVITY LIMITATIONS  carrying, lifting, sitting, standing, reach over head, hygiene/grooming, and locomotion level  PARTICIPATION LIMITATIONS: meal prep, cleaning, laundry, driving, community activity, and yard work  PERSONAL FACTORS Past/current experiences, Time since onset of injury/illness/exacerbation, and 3+ comorbidities: fibromyalgia depression, anxiety,   are also affecting patient's functional outcome.   REHAB POTENTIAL: Good  CLINICAL DECISION MAKING: Evolving/moderate complexity  EVALUATION COMPLEXITY: Moderate   GOALS: Goals reviewed with patient? Yes  SHORT TERM GOALS: Target date: 03/20/22  Return to regular performance of HEP 2-3x/day to improve mobility  Baseline: stretching 2x/day (02/28/22) Goal status: In progress  2.  Report > or = to 25% reduction in cervical and lumbar pain with ADLs and self-care  Baseline: 20% better overall (02/28/2022) Goal status: in progress   3.  Reduce fibromyalgia impact score to < or = to 39  Baseline: 28 (02/28/22) Goal status: MET    LONG TERM GOALS: Target date: 09/29/22  Be independent in advanced HEP Baseline: gentle exercise and able to progress depending on pain level (08/07/22) Goal status: In progress   2.  Reduce fibromyalgia impact questionnaire to < or = to 21 Baseline: 28 (02/28/22) Goal status: Revised   3.  Lift and carry laundry and her grandchildren with > or = to 70% reduction in pain Baseline: shoulder and neck and low back 50% (08/07/22) Goal status: In progress   4.  Report > or = to 60% reduction in neck and lumbar pain with ADLs and self-care  Baseline: 50% (08/07/22) Goal status: revised   5.  Return to yardwork verbalize appropriate rest breaks and mechanics  Baseline: has been able to do gentle things, will work to do more (06/12/22) Goal status: In progress    6. Report > or = to 30% reduction in Rt jaw pain with chewing and talking   Baseline  pain is resolved at this time (06/12/22)  Goal Status: MET PLAN: PT  FREQUENCY: 2x/week  PT DURATION: 8 weeks  PLANNED INTERVENTIONS: Therapeutic exercises, Therapeutic activity, Neuromuscular re-education, Balance training, Gait training, Patient/Family education, Self Care, Joint mobilization, Aquatic Therapy, Dry Needling, Spinal manipulation, Spinal mobilization, Cryotherapy, Moist heat, Taping, Traction, Manual therapy, and Re-evaluation  PLAN FOR NEXT SESSION: continue aquatics, manual therapy to address pain, strength progression  Ane Payment, PTA 09/20/22 8:02 AM

## 2022-09-21 DIAGNOSIS — E8809 Other disorders of plasma-protein metabolism, not elsewhere classified: Secondary | ICD-10-CM | POA: Diagnosis not present

## 2022-09-21 DIAGNOSIS — D472 Monoclonal gammopathy: Secondary | ICD-10-CM | POA: Diagnosis not present

## 2022-09-26 ENCOUNTER — Ambulatory Visit (INDEPENDENT_AMBULATORY_CARE_PROVIDER_SITE_OTHER): Payer: Medicare Other | Admitting: Psychology

## 2022-09-26 DIAGNOSIS — F428 Other obsessive-compulsive disorder: Secondary | ICD-10-CM | POA: Diagnosis not present

## 2022-09-26 DIAGNOSIS — F4310 Post-traumatic stress disorder, unspecified: Secondary | ICD-10-CM | POA: Diagnosis not present

## 2022-09-26 DIAGNOSIS — F902 Attention-deficit hyperactivity disorder, combined type: Secondary | ICD-10-CM

## 2022-09-26 DIAGNOSIS — F331 Major depressive disorder, recurrent, moderate: Secondary | ICD-10-CM | POA: Diagnosis not present

## 2022-09-27 ENCOUNTER — Ambulatory Visit: Payer: Medicare Other

## 2022-09-27 NOTE — Addendum Note (Signed)
Addended by: Edrick Oh on: 09/27/2022 05:40 PM   Modules accepted: Orders

## 2022-09-27 NOTE — Progress Notes (Signed)
[facility] Counselor/Therapist Progress Note  Patient ID: Danielle Bruce, MRN: [id number],    Date: 09/26/2022  Time Spent: 60 minutes Time In:  3:00  Time out: 4:00  Treatment Type: Individual Therapy  Reported Symptoms: flashbacks, responding to triggers, depression, anxiety, being busy all of the time, dissociation  Mental Status Exam: Appearance:  Casual     Behavior: Appropriate  Motor: Restlestness  Speech/Language:  Clear and Coherent  Affect: Blunt  Mood: pleasant  Thought process: normal  Thought content:   WNL  Sensory/Perceptual disturbances:   WNL  Orientation: oriented to person, place, time/date, and situation  Attention: Good  Concentration: Good  Memory: WNL  Fund of knowledge:  Good  Insight:   Good  Judgment:  Fair  Impulse Control: Good   Risk Assessment: Danger to Self:  No Self-injurious Behavior: No Danger to Others: No Duty to Warn:no Physical Aggression / Violence:No  Access to Firearms a concern: No  Gang Involvement:No   Subjective: The patient attended a face-to-face individual therapy session in the office today.  The patient talked today about having issues with ADD.  We began talking about different compensatory strategies that are helpful to people who have ADD and I educated her about some of these.  The patient was very receptive to hearing this and we talked about how to implement these so that she can function better using these tools.  Used psychoeducation today and problem solving.   Interventions: Cognitive Behavioral Therapy, Mindfulness Meditation, Eye Movement Desensitization and Reprocessing (EMDR), Insight-Oriented, and Interpersonal, psychoeducation  Diagnosis:Attention deficit hyperactivity disorder (ADHD), combined type  PTSD (post-traumatic stress disorder)  Obsessional thoughts  Major depressive disorder, recurrent episode, moderate (HCC)  Plan: Plan of Care: Client Abilities/Strengths   Insightful, motivated, Supportive family Client Treatment Preferences  Outpatient Individual therapy/EMDR  Client Statement of Needs  "I think I have PTSD and I feel like I need another kind of therapy than talk therapy" Treatment Level  Outpatient Individual therapy  Symptoms  Demonstrates an exaggerated startle response.:  (Status: maintained). Depressed  or irritable mood.: (Status:maintained). Describes a reliving of the event,  particularly through dissociative flashbacks.: (Status: maintained). Displays a  significant decline in interest and engagement in activities.:  (Status: maintained). Displays significant psychological and/or physiological distress resulting from internal and external  clues that are reminiscent of the traumatic event.: (Status: maintained).  Experiences disturbances in sleep.:  (Status: maintained). Experiences disturbing  and persistent thoughts, images, and/or perceptions of the traumatic event.:  (Status: maintained). Experiences frequent nightmares.: (Status: maintained).  Feelings of hopelessness, worthlessness, or inappropriate guilt.: No Description Entered (Status:  improved). Has been exposed to a traumatic event involving actual or perceived threat of death or  serious injury.:  (Status: maintained). Impairment in social, occupational, or  other areas of functioning.:(Status: maintained). Intentionally avoids activities,  places, people, or objects (e.g., up-armored vehicles) that evoke memories of the event.: (Status: maintained). Intentionally avoids thoughts, feelings, or discussions related  to the traumatic event.: (Status: maintained). Reports difficulty concentrating as  well as feelings of guilt (Status:maintained). Reports response of intense fear,  helplessness, or horror to the traumatic event.:  (Status: maintained).  Problems Addressed  Unipolar Depression, Posttraumatic Stress Disorder (PTSD), Posttraumatic Stress Disorder (PTSD),    Posttraumatic Stress Disorder (PTSD), Posttraumatic Stress Disorder (PTSD)  Goals 1. Develop healthy thinking patterns and beliefs about self, others, and the world that lead to the alleviation and help prevent the relapse of  depression. Objective Identify  and replace thoughts and beliefs that support depression. Target Date: 2023/01/04 Frequency: Weekly Progress: 20 Modality: individual Related Interventions 1. Explore and restructure underlying assumptions and beliefs reflected in biased self-talk that  may put the client at risk for relapse or recurrence. 2. Conduct Cognitive-Behavioral Therapy (see Cognitive Behavior Therapy by Reola Calkins; Overcoming Depression by Agapito Games al.), beginning with helping the client learn the connection among  cognition, depressive feelings, and actions. 2. Eliminate or reduce the negative impact trauma related symptoms have  on social, occupational, and family functioning. Objective Learn and implement personal skills to manage challenging situations related to trauma. Target Date: 2023/01/04 Frequency: Weekly Progress: 0 Modality: individual 3. No longer avoids persons, places, activities, and objects that are  reminiscent of the traumatic event. Objective Participate in Eye Movement Desensitization and Reprocessing (EMDR) to reduce emotional distress  related to traumatic thoughts, feelings, and images. Target Date: 2023/01/04 Frequency: Weekly Progress: 0 Modality: individual   Related Interventions 1. Utilize Eye Movement Desensitization and Reprocessing (EMDR) to reduce the client's  emotional reactivity to the traumatic event and reduce PTSD symptoms. Objective Learn and implement guided self-dialogue to manage thoughts, feelings, and urges brought on by  encounters with trauma-related situations. Target Date: 2024-11/07 Frequency: Weekly Progress: 10 Modality: individual Related Interventions 1. Teach the client a guided self-dialogue  procedure in which he/she learns to recognize  maladaptive self-talk, challenges its biases, copes with engendered feelings, overcomes  avoidance, and reinforces his/her accomplishments; review and reinforce progress, problemsolve obstacles. 4. No longer experiences intrusive event recollections, avoidance of event  reminders, intense arousal, or disinterest in activities or  relationships. 5. Thinks about or openly discusses the traumatic event with others  without experiencing psychological or physiological distress. Diagnosis Axis  none F43.10 (Posttraumatic stress disorder) - Open - [Signifier: n/a] Posttraumatic Stress  Disorder  Conditions For Discharge Achievement of treatment goals and objectives   Tate Zagal G Elery Cadenhead, LCSW

## 2022-09-28 ENCOUNTER — Other Ambulatory Visit (HOSPITAL_BASED_OUTPATIENT_CLINIC_OR_DEPARTMENT_OTHER): Payer: Self-pay

## 2022-09-28 NOTE — Therapy (Signed)
OUTPATIENT PHYSICAL THERAPY TREATMENT   Patient Name: Danielle Bruce MRN: [id number] DOB:1960-04-20, 62 y.o., female Today's Date: 09/29/2022      PT End of Session - 09/29/22 1356     Visit Number 55    Date for PT Re-Evaluation 11/22/22    Authorization Type Medicare B- KX now    Progress Note Due on Visit 58    PT Start Time 1355    PT Stop Time 1433    PT Time Calculation (min) 38 min    Activity Tolerance Patient tolerated treatment well    Behavior During Therapy [facility] for tasks assessed/performed                                                        Past Medical History:  Diagnosis Date   Abdominal pain, unspecified site 10/18/2012   Anemia    Anxiety    Arthritis    osteoarthritis   ASCUS (atypical squamous cells of undetermined significance) on Pap smear 05/06/2005   NEG HIGH RISK HPV--C&B BIOPSY BENIGN 12/2005   Asthma    Bipolar 2 disorder (HCC)    Cancer (HCC)    skin cancer - basal cell   Colon polyps    Complication of anesthesia    anxious afterwards, will get headaches    Constipation    Depression    Fibromyalgia 10/2013   GERD (gastroesophageal reflux disease)    Hearing loss on left    Heart murmur    never had any problems   Hemorrhoids    High cholesterol    High risk HPV infection 08/2011   cytology negative   IBS (irritable bowel syndrome)    Insomnia    Lymphocytic colitis    Lymphoma (HCC)    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   Bone marrow biopsy showes 8% plasma cells IgA Lambda   Migraines    Nausea alone 10/18/2012   Osteoarthritis    Osteopenia    Peripheral neuropathy    PTSD (post-traumatic stress disorder)    Past Surgical History:  Procedure Laterality Date   BONE MARROW BIOPSY Left 12/18/2013   Plasma cell dyscrasia 8% population of plasma cells   BREAST BIOPSY Right    benign stereo   CESAREAN SECTION  40,98,11   CHOLECYSTECTOMY N/A 10/16/2012    Procedure: LAPAROSCOPIC CHOLECYSTECTOMY;  Surgeon: Clovis Pu. Cornett, MD;  Location: [facility];  Service: General;  Laterality: N/A;   COLONOSCOPY     numerous times   DILATION AND CURETTAGE OF UTERUS     ESOPHAGOGASTRODUODENOSCOPY     HEMORRHOID SURGERY  1993   x3   IUD REMOVAL  02/2015   Mirena   LAPAROSCOPIC LYSIS OF ADHESIONS N/A 10/16/2012   Procedure: LAPAROSCOPIC LYSIS OF ADHESIONS;  Surgeon: Maisie Fus A. Cornett, MD;  Location: [facility];  Service: General;  Laterality: N/A;   LAPAROSCOPY N/A 10/16/2012   Procedure: LAPAROSCOPY DIAGNOSTIC;  Surgeon: Clovis Pu. Cornett, MD;  Location: [facility];  Service: General;  Laterality: N/A;   PELVIC LAPAROSCOPY     RADIOLOGY WITH ANESTHESIA N/A 12/16/2015   Procedure: MRI OF BRAIN WITH AND WITHOUT CONTRAST;  Surgeon: Medication Radiologist, MD;  Location: [facility];  Service: Radiology;  Laterality: N/A;   SHOULDER SURGERY  2007/2008   SPINE SURGERY  2010  cervical   Patient Active Problem List   Diagnosis Date Noted   GAD (generalized anxiety disorder) 12/26/2017   OCD (obsessive compulsive disorder) 12/26/2017   PTSD (post-traumatic stress disorder) 12/26/2017   DDD (degenerative disc disease), cervical 07/14/2016   Primary osteoarthritis of both feet 07/14/2016   Primary osteoarthritis of both hands 07/14/2016   Other fatigue 07/14/2016   History of IBS 07/14/2016   Osteopenia of multiple sites 07/14/2016   Fibromyalgia 01/13/2016   MGUS (monoclonal gammopathy of unknown significance) 12/23/2013   Chronic cholecystitis without calculus 10/18/2012   Abdominal pain, unspecified site 10/18/2012   Nausea alone 10/18/2012   Unspecified constipation 10/18/2012   Depression 10/18/2012   Anxiety    ASCUS (atypical squamous cells of undetermined significance) on Pap smear    IUD    Hemorrhoids 11/29/2010   Abdominal pain, left upper quadrant 11/29/2010    PCP:  Lupita Raider, MD  REFERRING PROVIDER: Lupita Raider, MD  REFERRING DIAG:  fibromyalgia, TMJ dysfunction (added 04/20/22)  THERAPY DIAG:  Abnormal posture  Muscle weakness (generalized)  Cramp and spasm  Cervicalgia  Rationale for Evaluation and Treatment Rehabilitation  ONSET DATE: chronic pain (fibromyalgia) with flare-up 2 months ago  SUBJECTIVE:                                                                                                                                                                                                         SUBJECTIVE STATEMENT:  Massive fibro flare has kept me from attending PT. I am near the end of it. My hips are sore and I think it is from not moving them/exercising.    PERTINENT HISTORY:  Anxiety, bipolar disorder, depression, fibromyalgia, IBS, migraines, osteopenia, PTSD  PAIN:  Are you having pain: yes Pain location: hips Bil TMJ 0/10- chewing sometimes just hurts  Pain description: irritating  Aggravating factors: doing too much Relieving factors: muscle relaxers, rest, stretching  PRECAUTIONS: Other: chronic pain syndrome and depression Other: slow progression with exercise due to chronic condition  WEIGHT BEARING RESTRICTIONS No  FALLS:  Has patient fallen in last 6 months? No  LIVING ENVIRONMENT: Lives with: lives with their family Lives in: House/apartment  OCCUPATION: on disability  PLOF: Independent with basic ADLs and Leisure: yardwork, walking  Pt cares for her 4 young grandchildren- difficulty with care including lifting and carrying  PATIENT GOALS be more active with less pain, exercise at the gym regularly, lift and carry grandchildren and laundry, sleep with fewer interruptions, get back to more gardening.  OBJECTIVE:   DIAGNOSTIC FINDINGS: none recent  PATIENT SURVEYS: Eval: Fibromyalgia Impact Questionnaire: first 2 sections only: 49 12/26/21: 40  02/28/22: 28 (first 2 sections only)  COGNITION: Overall cognitive status: Within functional limits for tasks  assessed  SENSATION: WFL  POSTURE: rounded shoulders and forward head  PALPATION: Diffuse palpable tenderness over bil neck, upper traps, thoracic, lumbar and gluteals with trigger points.    CERVICAL ROM:   Active ROM A/PROM (deg) eval A/ROM 12/26/21 A/ROM 04/20/22 A/ROM 08/07/22  Flexion 55   60  Extension 40     Right lateral flexion 30 35 40 41  Left lateral flexion 35 40 45 50  Right rotation 50 60 65 70  Left rotation 40 65 65 75   (Blank rows = not tested) TMJ: opening limited by 25%, clicking with opening on the Rt.  Palpable tenderness over Rt masseter and at TMJ UPPER EXTREMITY ROM: UE A/ROM is limited by 20% into flexion and abduction with pain at end range.  Hip flexibility is limited by 25% in all directions with pain in all directions.  UPPER EXTREMITY MMT: UE: 4+/5, LE 4+/5 except hip flexors 4-/5  TODAY'S TREATMENT:   09/20/22:Pt arrives for aquatic physical therapy. Treatment took place in 3.5-5.5 feet of water. Water temperature was 91 degrees F. Pt entered the pool via stairs reciprocally with mild use of rails. Pt requires buoyancy of water for support and to offload joints with strengthening exercises. Pt utilizes viscosity of the water required for strengthening. Seated water bench with 75% submersion Pt performed seated LE AROM exercises 20x in all planes,   Standing in 50%- 75% depth water pt performed water walking in all 4 directions 10x with no ankle fins today. VC for only work at level she felt appropriate. UE single buoy wts used for push/pull with UE. 50% depth standing: knee ext Bil 10x with small noodle, hip 3 ways with ankle fins 15x required some UE for balance today, Underwater bicycle 5 min 2x with noodle in horseback.2 min decompression rest in between sets.   HOME EXERCISE PROGRAM: Access Code: [email or web address] Access Code: [email or web address] URL: [email or web address] Date: 09/06/2022 Prepared by: Tresa Endo  - Standing Hip Abduction with  Counter Support  - 1 x daily - 7 x weekly - 1-2 sets - 10 reps - Standing Hip Extension with Chair  - 1 x daily - 7 x weekly - 1-2 sets - 10 reps ASSESSMENT:  CLINICAL IMPRESSION:  Pt returns to aquatic Pt after missing several weeks due to a fibromyalgia flare. We reduced overall workout in the pool today to see how she tolerated and seemed to do well. Minimal fatigue towards the end but no pain increases. Pt reports moving in the water made her hips feel much better.  OBJECTIVE IMPAIRMENTS decreased activity tolerance, decreased endurance, difficulty walking, decreased strength, increased muscle spasms, impaired flexibility, improper body mechanics, postural dysfunction, and pain.   ACTIVITY LIMITATIONS carrying, lifting, sitting, standing, reach over head, hygiene/grooming, and locomotion level  PARTICIPATION LIMITATIONS: meal prep, cleaning, laundry, driving, community activity, and yard work  PERSONAL FACTORS Past/current experiences, Time since onset of injury/illness/exacerbation, and 3+ comorbidities: fibromyalgia depression, anxiety,   are also affecting patient's functional outcome.   REHAB POTENTIAL: Good  CLINICAL DECISION MAKING: Evolving/moderate complexity  EVALUATION COMPLEXITY: Moderate   GOALS: Goals reviewed with patient? Yes  SHORT TERM GOALS: Target date: 03/20/22  Return to regular performance of HEP 2-3x/day to improve mobility  Baseline: stretching 2x/day (02/28/22) Goal status: In progress  2.  Report > or = to  25% reduction in cervical and lumbar pain with ADLs and self-care  Baseline: 20% better overall (02/28/2022) Goal status: in progress   3.  Reduce fibromyalgia impact score to < or = to 39  Baseline: 28 (02/28/22) Goal status: MET    LONG TERM GOALS: Target date: 09/29/22  Be independent in advanced HEP Baseline: gentle exercise and able to progress depending on pain level (08/07/22) Goal status: In progress   2.  Reduce fibromyalgia impact  questionnaire to < or = to 21 Baseline: 28 (02/28/22) Goal status: Revised   3.  Lift and carry laundry and her grandchildren with > or = to 70% reduction in pain Baseline: shoulder and neck and low back 50% (08/07/22) Goal status: In progress   4.  Report > or = to 60% reduction in neck and lumbar pain with ADLs and self-care  Baseline: 50% (08/07/22) Goal status: revised   5.  Return to yardwork verbalize appropriate rest breaks and mechanics  Baseline: has been able to do gentle things, will work to do more (06/12/22) Goal status: In progress    6. Report > or = to 30% reduction in Rt jaw pain with chewing and talking   Baseline  pain is resolved at this time (06/12/22)  Goal Status: MET PLAN: PT FREQUENCY: 2x/week  PT DURATION: 8 weeks  PLANNED INTERVENTIONS: Therapeutic exercises, Therapeutic activity, Neuromuscular re-education, Balance training, Gait training, Patient/Family education, Self Care, Joint mobilization, Aquatic Therapy, Dry Needling, Spinal manipulation, Spinal mobilization, Cryotherapy, Moist heat, Taping, Traction, Manual therapy, and Re-evaluation  PLAN FOR NEXT SESSION: continue aquatics, manual therapy to address pain, strength progression  Ane Payment, PTA 09/29/22 3:57 PM

## 2022-09-29 ENCOUNTER — Ambulatory Visit: Payer: Medicare Other | Attending: Family Medicine | Admitting: Physical Therapy

## 2022-09-29 ENCOUNTER — Encounter: Payer: Self-pay | Admitting: Physical Therapy

## 2022-09-29 DIAGNOSIS — R252 Cramp and spasm: Secondary | ICD-10-CM | POA: Diagnosis not present

## 2022-09-29 DIAGNOSIS — R293 Abnormal posture: Secondary | ICD-10-CM | POA: Insufficient documentation

## 2022-09-29 DIAGNOSIS — M542 Cervicalgia: Secondary | ICD-10-CM | POA: Diagnosis not present

## 2022-09-29 DIAGNOSIS — M353 Polymyalgia rheumatica: Secondary | ICD-10-CM | POA: Diagnosis not present

## 2022-09-29 DIAGNOSIS — M6281 Muscle weakness (generalized): Secondary | ICD-10-CM | POA: Insufficient documentation

## 2022-10-03 ENCOUNTER — Ambulatory Visit: Payer: Medicare Other | Admitting: Psychology

## 2022-10-10 ENCOUNTER — Ambulatory Visit: Payer: Medicare Other | Admitting: Psychology

## 2022-10-10 DIAGNOSIS — F902 Attention-deficit hyperactivity disorder, combined type: Secondary | ICD-10-CM

## 2022-10-10 DIAGNOSIS — F331 Major depressive disorder, recurrent, moderate: Secondary | ICD-10-CM | POA: Diagnosis not present

## 2022-10-10 DIAGNOSIS — F411 Generalized anxiety disorder: Secondary | ICD-10-CM

## 2022-10-10 DIAGNOSIS — F428 Other obsessive-compulsive disorder: Secondary | ICD-10-CM

## 2022-10-10 DIAGNOSIS — F4310 Post-traumatic stress disorder, unspecified: Secondary | ICD-10-CM | POA: Diagnosis not present

## 2022-10-11 ENCOUNTER — Ambulatory Visit: Payer: Self-pay | Admitting: Physical Therapy

## 2022-10-11 ENCOUNTER — Ambulatory Visit: Payer: Medicare Other

## 2022-10-11 DIAGNOSIS — R293 Abnormal posture: Secondary | ICD-10-CM

## 2022-10-11 DIAGNOSIS — H5319 Other subjective visual disturbances: Secondary | ICD-10-CM | POA: Diagnosis not present

## 2022-10-11 DIAGNOSIS — R252 Cramp and spasm: Secondary | ICD-10-CM

## 2022-10-11 DIAGNOSIS — H31093 Other chorioretinal scars, bilateral: Secondary | ICD-10-CM | POA: Diagnosis not present

## 2022-10-11 DIAGNOSIS — H33323 Round hole, bilateral: Secondary | ICD-10-CM | POA: Diagnosis not present

## 2022-10-11 DIAGNOSIS — H35371 Puckering of macula, right eye: Secondary | ICD-10-CM | POA: Diagnosis not present

## 2022-10-11 DIAGNOSIS — M542 Cervicalgia: Secondary | ICD-10-CM | POA: Diagnosis not present

## 2022-10-11 DIAGNOSIS — H25813 Combined forms of age-related cataract, bilateral: Secondary | ICD-10-CM | POA: Diagnosis not present

## 2022-10-11 DIAGNOSIS — M6281 Muscle weakness (generalized): Secondary | ICD-10-CM

## 2022-10-11 DIAGNOSIS — M353 Polymyalgia rheumatica: Secondary | ICD-10-CM | POA: Diagnosis not present

## 2022-10-11 NOTE — Progress Notes (Signed)
[facility] Counselor/Therapist Progress Note  Patient ID: Danielle Bruce, MRN: [id number],    Date: 10/10/2022  Time Spent: 60 minutes Time In:  3:00  Time out: 4:00  Treatment Type: Individual Therapy  Reported Symptoms: flashbacks, responding to triggers, depression, anxiety, being busy all of the time, dissociation  Mental Status Exam: Appearance:  Casual     Behavior: Appropriate  Motor: Restlestness  Speech/Language:  Clear and Coherent  Affect: Blunt  Mood: pleasant  Thought process: normal  Thought content:   WNL  Sensory/Perceptual disturbances:   WNL  Orientation: oriented to person, place, time/date, and situation  Attention: Good  Concentration: Good  Memory: WNL  Fund of knowledge:  Good  Insight:   Good  Judgment:  Fair  Impulse Control: Good   Risk Assessment: Danger to Self:  No Self-injurious Behavior: No Danger to Others: No Duty to Warn:no Physical Aggression / Violence:No  Access to Firearms a concern: No  Gang Involvement:No   Subjective: The patient attended a face-to-face individual therapy session in the office today.  The patient presents as pleasant and cooperative.  The patient reports that she is feeling better from a recent bout with fibromyalgia.  We talked about the need to begin to think about how we want to proceed with the EMDR for her trauma.  I explained to her that some of her physical ailments may be related to her trauma and we talked about the need to go ahead and start thinking about how we would like to proceed with that.  It seems that sometimes the patient has physical ailments maybe because she does not want to deal with the stress of having to deal with her trauma.  I recommended that she start to try to implement some sort of mindfulness meditation practice.  I encouraged her to think about trying to do something on a daily basis.  The patient right now does not have a meditation practice and I explained to her  that with trauma work we need her to be able to be grounded and able to calm herself.  I am not sure that sometimes when she is having a migraine headache that she is not dissociating to some degree.  We discussed this and talked about the need for her to be able to help herself calm down and relax.  The patient is willing to consider trying to do some type of mindfulness and meditation between now and the next session.  Interventions: Cognitive Behavioral Therapy, Mindfulness Meditation, Eye Movement Desensitization and Reprocessing (EMDR), Insight-Oriented, and Interpersonal, psychoeducation  Diagnosis:Attention deficit hyperactivity disorder (ADHD), combined type  PTSD (post-traumatic stress disorder)  Obsessional thoughts  Major depressive disorder, recurrent episode, moderate (HCC)  Generalized anxiety disorder  Plan: Plan of Care: Client Abilities/Strengths  Insightful, motivated, Supportive family Client Treatment Preferences  Outpatient Individual therapy/EMDR  Client Statement of Needs  "I think I have PTSD and I feel like I need another kind of therapy than talk therapy" Treatment Level  Outpatient Individual therapy  Symptoms  Demonstrates an exaggerated startle response.:  (Status: maintained). Depressed  or irritable mood.: (Status:maintained). Describes a reliving of the event,  particularly through dissociative flashbacks.: (Status: maintained). Displays a  significant decline in interest and engagement in activities.:  (Status: maintained). Displays significant psychological and/or physiological distress resulting from internal and external  clues that are reminiscent of the traumatic event.: (Status: maintained).  Experiences disturbances in sleep.:  (Status: maintained). Experiences disturbing  and persistent thoughts,  images, and/or perceptions of the traumatic event.:  (Status: maintained). Experiences frequent nightmares.: (Status: maintained).  Feelings of  hopelessness, worthlessness, or inappropriate guilt.: No Description Entered (Status:  improved). Has been exposed to a traumatic event involving actual or perceived threat of death or  serious injury.:  (Status: maintained). Impairment in social, occupational, or  other areas of functioning.:(Status: maintained). Intentionally avoids activities,  places, people, or objects (e.g., up-armored vehicles) that evoke memories of the event.: (Status: maintained). Intentionally avoids thoughts, feelings, or discussions related  to the traumatic event.: (Status: maintained). Reports difficulty concentrating as  well as feelings of guilt (Status:maintained). Reports response of intense fear,  helplessness, or horror to the traumatic event.:  (Status: maintained).  Problems Addressed  Unipolar Depression, Posttraumatic Stress Disorder (PTSD), Posttraumatic Stress Disorder (PTSD),   Posttraumatic Stress Disorder (PTSD), Posttraumatic Stress Disorder (PTSD)  Goals 1. Develop healthy thinking patterns and beliefs about self, others, and the world that lead to the alleviation and help prevent the relapse of  depression. Objective Identify and replace thoughts and beliefs that support depression. Target Date: 2023/01/04 Frequency: Weekly Progress: 20 Modality: individual Related Interventions 1. Explore and restructure underlying assumptions and beliefs reflected in biased self-talk that  may put the client at risk for relapse or recurrence. 2. Conduct Cognitive-Behavioral Therapy (see Cognitive Behavior Therapy by Reola Calkins; Overcoming Depression by Agapito Games al.), beginning with helping the client learn the connection among  cognition, depressive feelings, and actions. 2. Eliminate or reduce the negative impact trauma related symptoms have  on social, occupational, and family functioning. Objective Learn and implement personal skills to manage challenging situations related to trauma. Target Date:  2023/01/04 Frequency: Weekly Progress: 0 Modality: individual 3. No longer avoids persons, places, activities, and objects that are  reminiscent of the traumatic event. Objective Participate in Eye Movement Desensitization and Reprocessing (EMDR) to reduce emotional distress  related to traumatic thoughts, feelings, and images. Target Date: 2023/01/04 Frequency: Weekly Progress: 0 Modality: individual   Related Interventions 1. Utilize Eye Movement Desensitization and Reprocessing (EMDR) to reduce the client's  emotional reactivity to the traumatic event and reduce PTSD symptoms. Objective Learn and implement guided self-dialogue to manage thoughts, feelings, and urges brought on by  encounters with trauma-related situations. Target Date: 2024-11/07 Frequency: Weekly Progress: 10 Modality: individual Related Interventions 1. Teach the client a guided self-dialogue procedure in which he/she learns to recognize  maladaptive self-talk, challenges its biases, copes with engendered feelings, overcomes  avoidance, and reinforces his/her accomplishments; review and reinforce progress, problemsolve obstacles. 4. No longer experiences intrusive event recollections, avoidance of event  reminders, intense arousal, or disinterest in activities or  relationships. 5. Thinks about or openly discusses the traumatic event with others  without experiencing psychological or physiological distress. Diagnosis Axis  none F43.10 (Posttraumatic stress disorder) - Open - [Signifier: n/a] Posttraumatic Stress  Disorder  Conditions For Discharge Achievement of treatment goals and objectives    G , LCSW

## 2022-10-11 NOTE — Therapy (Signed)
OUTPATIENT PHYSICAL THERAPY TREATMENT   Patient Name: ELOYCE FLEGEL MRN: [id number] DOB:07/31/1960, 62 y.o., female Today's Date: 10/11/2022      PT End of Session - 10/11/22 1251     Visit Number 56    Date for PT Re-Evaluation 11/22/22    Authorization Type Medicare B- KX now    Progress Note Due on Visit 58    PT Start Time 1232    PT Stop Time 1316    PT Time Calculation (min) 44 min    Activity Tolerance Patient tolerated treatment well    Behavior During Therapy [facility] for tasks assessed/performed                                                         Past Medical History:  Diagnosis Date   Abdominal pain, unspecified site 10/18/2012   Anemia    Anxiety    Arthritis    osteoarthritis   ASCUS (atypical squamous cells of undetermined significance) on Pap smear 05/06/2005   NEG HIGH RISK HPV--C&B BIOPSY BENIGN 12/2005   Asthma    Bipolar 2 disorder (HCC)    Cancer (HCC)    skin cancer - basal cell   Colon polyps    Complication of anesthesia    anxious afterwards, will get headaches    Constipation    Depression    Fibromyalgia 10/2013   GERD (gastroesophageal reflux disease)    Hearing loss on left    Heart murmur    never had any problems   Hemorrhoids    High cholesterol    High risk HPV infection 08/2011   cytology negative   IBS (irritable bowel syndrome)    Insomnia    Lymphocytic colitis    Lymphoma (HCC)    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   Bone marrow biopsy showes 8% plasma cells IgA Lambda   Migraines    Nausea alone 10/18/2012   Osteoarthritis    Osteopenia    Peripheral neuropathy    PTSD (post-traumatic stress disorder)    Past Surgical History:  Procedure Laterality Date   BONE MARROW BIOPSY Left 12/18/2013   Plasma cell dyscrasia 8% population of plasma cells   BREAST BIOPSY Right    benign stereo   CESAREAN SECTION  57,84,69   CHOLECYSTECTOMY N/A  10/16/2012   Procedure: LAPAROSCOPIC CHOLECYSTECTOMY;  Surgeon: Clovis Pu. Cornett, MD;  Location: [facility];  Service: General;  Laterality: N/A;   COLONOSCOPY     numerous times   DILATION AND CURETTAGE OF UTERUS     ESOPHAGOGASTRODUODENOSCOPY     HEMORRHOID SURGERY  1993   x3   IUD REMOVAL  02/2015   Mirena   LAPAROSCOPIC LYSIS OF ADHESIONS N/A 10/16/2012   Procedure: LAPAROSCOPIC LYSIS OF ADHESIONS;  Surgeon: Maisie Fus A. Cornett, MD;  Location: [facility];  Service: General;  Laterality: N/A;   LAPAROSCOPY N/A 10/16/2012   Procedure: LAPAROSCOPY DIAGNOSTIC;  Surgeon: Clovis Pu. Cornett, MD;  Location: [facility];  Service: General;  Laterality: N/A;   PELVIC LAPAROSCOPY     RADIOLOGY WITH ANESTHESIA N/A 12/16/2015   Procedure: MRI OF BRAIN WITH AND WITHOUT CONTRAST;  Surgeon: Medication Radiologist, MD;  Location: [facility];  Service: Radiology;  Laterality: N/A;   SHOULDER SURGERY  2007/2008   SPINE SURGERY  2010  cervical   Patient Active Problem List   Diagnosis Date Noted   GAD (generalized anxiety disorder) 12/26/2017   OCD (obsessive compulsive disorder) 12/26/2017   PTSD (post-traumatic stress disorder) 12/26/2017   DDD (degenerative disc disease), cervical 07/14/2016   Primary osteoarthritis of both feet 07/14/2016   Primary osteoarthritis of both hands 07/14/2016   Other fatigue 07/14/2016   History of IBS 07/14/2016   Osteopenia of multiple sites 07/14/2016   Fibromyalgia 01/13/2016   MGUS (monoclonal gammopathy of unknown significance) 12/23/2013   Chronic cholecystitis without calculus 10/18/2012   Abdominal pain, unspecified site 10/18/2012   Nausea alone 10/18/2012   Unspecified constipation 10/18/2012   Depression 10/18/2012   Anxiety    ASCUS (atypical squamous cells of undetermined significance) on Pap smear    IUD    Hemorrhoids 11/29/2010   Abdominal pain, left upper quadrant 11/29/2010    PCP:  Lupita Raider, MD  REFERRING PROVIDER: Lupita Raider, MD  REFERRING  DIAG: fibromyalgia, TMJ dysfunction (added 04/20/22)  THERAPY DIAG:  Abnormal posture  Muscle weakness (generalized)  Cramp and spasm  Cervicalgia  Rationale for Evaluation and Treatment Rehabilitation  ONSET DATE: chronic pain (fibromyalgia) with flare-up 2 months ago  SUBJECTIVE:                                                                                                                                                                                                         SUBJECTIVE STATEMENT:  I am coming out of a fibromyalgia flare-up and the last few days have been better.  I worked in the garden for many hours and I was sore but it was a good type of sore.  My Rt jaw is bothering me and my neck and shoulders too.  I would like to do DN.    PERTINENT HISTORY:  Anxiety, bipolar disorder, depression, fibromyalgia, IBS, migraines, osteopenia, PTSD  PAIN:  Are you having pain: yes Pain location: hips Bil TMJ 0/10- chewing sometimes just hurts  Pain description: irritating  Aggravating factors: doing too much Relieving factors: muscle relaxers, rest, stretching  PRECAUTIONS: Other: chronic pain syndrome and depression Other: slow progression with exercise due to chronic condition  WEIGHT BEARING RESTRICTIONS No  FALLS:  Has patient fallen in last 6 months? No  LIVING ENVIRONMENT: Lives with: lives with their family Lives in: House/apartment  OCCUPATION: on disability  PLOF: Independent with basic ADLs and Leisure: yardwork, walking  Pt cares for her 4 young grandchildren- difficulty with care including lifting and carrying  PATIENT GOALS be more active with less pain,  exercise at the gym regularly, lift and carry grandchildren and laundry, sleep with fewer interruptions, get back to more gardening.  OBJECTIVE:   DIAGNOSTIC FINDINGS: none recent   PATIENT SURVEYS: Eval: Fibromyalgia Impact Questionnaire: first 2 sections only: 49 12/26/21: 40  02/28/22: 28  (first 2 sections only)  COGNITION: Overall cognitive status: Within functional limits for tasks assessed  SENSATION: WFL  POSTURE: rounded shoulders and forward head  PALPATION: Diffuse palpable tenderness over bil neck, upper traps, thoracic, lumbar and gluteals with trigger points.    CERVICAL ROM:   Active ROM A/PROM (deg) eval A/ROM 12/26/21 A/ROM 04/20/22 A/ROM 08/07/22  Flexion 55   60  Extension 40     Right lateral flexion 30 35 40 41  Left lateral flexion 35 40 45 50  Right rotation 50 60 65 70  Left rotation 40 65 65 75   (Blank rows = not tested) TMJ: opening limited by 25%, clicking with opening on the Rt.  Palpable tenderness over Rt masseter and at TMJ UPPER EXTREMITY ROM: UE A/ROM is limited by 20% into flexion and abduction with pain at end range.  Hip flexibility is limited by 25% in all directions with pain in all directions.  UPPER EXTREMITY MMT: UE: 4+/5, LE 4+/5 except hip flexors 4-/5  TODAY'S TREATMENT:  10/11/22: NuStep: Level 3 x 10 minutes for endurance and mobility-PT present to discuss progress. Ball roll outs forward and lateral x10 each Seated on ball: 3 way raises with 2# weights 2x10  Trigger Point Dry-Needling  Treatment instructions: Expect mild to moderate muscle soreness. S/S of pneumothorax if dry needled over a lung field, and to seek immediate medical attention should they occur. Patient verbalized understanding of these instructions and education.   Patient Consent Given: Yes Education handout provided: Previously provided Muscles treated: bil cervical and thoracic multifidi, rhomboids, upper traps, Rt masseter Treatment response/outcome: Utilized skilled palpation to identify trigger points.  During dry needling able to palpate muscle twitch and muscle elongation  Elongation and release to bil neck and upper back. Skilled palpation and monitoring by PT during dry needling   09/20/22:Pt arrives for aquatic physical therapy.  Treatment took place in 3.5-5.5 feet of water. Water temperature was 91 degrees F. Pt entered the pool via stairs reciprocally with mild use of rails. Pt requires buoyancy of water for support and to offload joints with strengthening exercises. Pt utilizes viscosity of the water required for strengthening. Seated water bench with 75% submersion Pt performed seated LE AROM exercises 20x in all planes,   Standing in 50%- 75% depth water pt performed water walking in all 4 directions 10x with no ankle fins today. VC for only work at level she felt appropriate. UE single buoy wts used for push/pull with UE. 50% depth standing: knee ext Bil 10x with small noodle, hip 3 ways with ankle fins 15x required some UE for balance today, Underwater bicycle 5 min 2x with noodle in horseback.2 min decompression rest in between sets.   HOME EXERCISE PROGRAM: Access Code: [email or web address] Access Code: [email or web address] URL: [email or web address] Date: 09/06/2022 Prepared by: Tresa Endo  - Standing Hip Abduction with Counter Support  - 1 x daily - 7 x weekly - 1-2 sets - 10 reps - Standing Hip Extension with Chair  - 1 x daily - 7 x weekly - 1-2 sets - 10 reps ASSESSMENT:  CLINICAL IMPRESSION:  Pt is coming out of a fibromyalgia flare and has felt good the past few days.  Pt tolerated all  activity well today and 2nd half of session focused on DN to neck and upper back in addition to Rt masseter.  She was able to garden for multiple hours with some soreness in muscles as result.  Pt is able to differentiate between good and bad soreness.   Pt has good response to DN with twitch and improved tissue mobility after manual therapy. Patient will benefit from skilled PT to address the below impairments and improve overall function.   OBJECTIVE IMPAIRMENTS decreased activity tolerance, decreased endurance, difficulty walking, decreased strength, increased muscle spasms, impaired flexibility, improper body mechanics, postural  dysfunction, and pain.   ACTIVITY LIMITATIONS carrying, lifting, sitting, standing, reach over head, hygiene/grooming, and locomotion level  PARTICIPATION LIMITATIONS: meal prep, cleaning, laundry, driving, community activity, and yard work  PERSONAL FACTORS Past/current experiences, Time since onset of injury/illness/exacerbation, and 3+ comorbidities: fibromyalgia depression, anxiety,   are also affecting patient's functional outcome.   REHAB POTENTIAL: Good  CLINICAL DECISION MAKING: Evolving/moderate complexity  EVALUATION COMPLEXITY: Moderate   GOALS: Goals reviewed with patient? Yes  SHORT TERM GOALS: Target date: 03/20/22  Return to regular performance of HEP 2-3x/day to improve mobility  Baseline: stretching 2x/day (02/28/22) Goal status: In progress  2.  Report > or = to 25% reduction in cervical and lumbar pain with ADLs and self-care  Baseline: 20% better overall (02/28/2022) Goal status: in progress   3.  Reduce fibromyalgia impact score to < or = to 39  Baseline: 28 (02/28/22) Goal status: MET    LONG TERM GOALS: Target date: 11/22/22  Be independent in advanced HEP Baseline: gentle exercise and able to progress depending on pain level (08/07/22) Goal status: In progress   2.  Reduce fibromyalgia impact questionnaire to < or = to 21 Baseline: 28 (02/28/22) Goal status: Revised   3.  Lift and carry laundry and her grandchildren with > or = to 70% reduction in pain Baseline: shoulder and neck and low back 50% (08/07/22) Goal status: In progress   4.  Report > or = to 60% reduction in neck and lumbar pain with ADLs and self-care  Baseline: 50% (08/07/22) Goal status: revised   5.  Return to yardwork verbalize appropriate rest breaks and mechanics  Baseline: has been able to do gentle things, will work to do more (06/12/22) Goal status: In progress    6. Report > or = to 30% reduction in Rt jaw pain with chewing and talking   Baseline  pain is resolved at this time  (06/12/22)  Goal Status: MET PLAN: PT FREQUENCY: 2x/week  PT DURATION: 8 weeks  PLANNED INTERVENTIONS: Therapeutic exercises, Therapeutic activity, Neuromuscular re-education, Balance training, Gait training, Patient/Family education, Self Care, Joint mobilization, Aquatic Therapy, Dry Needling, Spinal manipulation, Spinal mobilization, Cryotherapy, Moist heat, Taping, Traction, Manual therapy, and Re-evaluation  PLAN FOR NEXT SESSION: continue aquatics, manual therapy to address pain, strength progression  Lorrene Reid, PT 10/11/22 1:19 PM

## 2022-10-12 DIAGNOSIS — G43909 Migraine, unspecified, not intractable, without status migrainosus: Secondary | ICD-10-CM | POA: Diagnosis not present

## 2022-10-12 DIAGNOSIS — F411 Generalized anxiety disorder: Secondary | ICD-10-CM | POA: Diagnosis not present

## 2022-10-12 DIAGNOSIS — M797 Fibromyalgia: Secondary | ICD-10-CM | POA: Diagnosis not present

## 2022-10-12 DIAGNOSIS — J309 Allergic rhinitis, unspecified: Secondary | ICD-10-CM | POA: Diagnosis not present

## 2022-10-12 DIAGNOSIS — H269 Unspecified cataract: Secondary | ICD-10-CM | POA: Diagnosis not present

## 2022-10-12 NOTE — Therapy (Signed)
OUTPATIENT PHYSICAL THERAPY TREATMENT   Patient Name: Danielle Bruce MRN: [id number] DOB:[birth year], 62 y.o., female Today's Date: 10/13/2022      PT End of Session - 10/13/22 1353     Visit Number 57    Date for PT Re-Evaluation 11/22/22    Authorization Type Medicare B- KX now    Progress Note Due on Visit 58    PT Start Time 1353    PT Stop Time 1435    PT Time Calculation (min) 42 min    Activity Tolerance Patient tolerated treatment well                                                          Past Medical History:  Diagnosis Date   Abdominal pain, unspecified site 10/18/2012   Anemia    Anxiety    Arthritis    osteoarthritis   ASCUS (atypical squamous cells of undetermined significance) on Pap smear 05/06/2005   NEG HIGH RISK HPV--C&B BIOPSY BENIGN 12/2005   Asthma    Bipolar 2 disorder (HCC)    Cancer (HCC)    skin cancer - basal cell   Colon polyps    Complication of anesthesia    anxious afterwards, will get headaches    Constipation    Depression    Fibromyalgia 10/2013   GERD (gastroesophageal reflux disease)    Hearing loss on left    Heart murmur    never had any problems   Hemorrhoids    High cholesterol    High risk HPV infection 08/2011   cytology negative   IBS (irritable bowel syndrome)    Insomnia    Lymphocytic colitis    Lymphoma (HCC)    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   Bone marrow biopsy showes 8% plasma cells IgA Lambda   Migraines    Nausea alone 10/18/2012   Osteoarthritis    Osteopenia    Peripheral neuropathy    PTSD (post-traumatic stress disorder)    Past Surgical History:  Procedure Laterality Date   BONE MARROW BIOPSY Left 12/18/2013   Plasma cell dyscrasia 8% population of plasma cells   BREAST BIOPSY Right    benign stereo   CESAREAN SECTION  14,78,29   CHOLECYSTECTOMY N/A 10/16/2012   Procedure: LAPAROSCOPIC CHOLECYSTECTOMY;  Surgeon:  Clovis Pu. Cornett, MD;  Location: [facility];  Service: General;  Laterality: N/A;   COLONOSCOPY     numerous times   DILATION AND CURETTAGE OF UTERUS     ESOPHAGOGASTRODUODENOSCOPY     HEMORRHOID SURGERY  1993   x3   IUD REMOVAL  02/2015   Mirena   LAPAROSCOPIC LYSIS OF ADHESIONS N/A 10/16/2012   Procedure: LAPAROSCOPIC LYSIS OF ADHESIONS;  Surgeon: Maisie Fus A. Cornett, MD;  Location: [facility];  Service: General;  Laterality: N/A;   LAPAROSCOPY N/A 10/16/2012   Procedure: LAPAROSCOPY DIAGNOSTIC;  Surgeon: Clovis Pu. Cornett, MD;  Location: [facility];  Service: General;  Laterality: N/A;   PELVIC LAPAROSCOPY     RADIOLOGY WITH ANESTHESIA N/A 12/16/2015   Procedure: MRI OF BRAIN WITH AND WITHOUT CONTRAST;  Surgeon: Medication Radiologist, MD;  Location: [facility];  Service: Radiology;  Laterality: N/A;   SHOULDER SURGERY  2007/2008   SPINE SURGERY  2010   cervical   Patient Active Problem List  Diagnosis Date Noted   GAD (generalized anxiety disorder) 12/26/2017   OCD (obsessive compulsive disorder) 12/26/2017   PTSD (post-traumatic stress disorder) 12/26/2017   DDD (degenerative disc disease), cervical 07/14/2016   Primary osteoarthritis of both feet 07/14/2016   Primary osteoarthritis of both hands 07/14/2016   Other fatigue 07/14/2016   History of IBS 07/14/2016   Osteopenia of multiple sites 07/14/2016   Fibromyalgia 01/13/2016   MGUS (monoclonal gammopathy of unknown significance) 12/23/2013   Chronic cholecystitis without calculus 10/18/2012   Abdominal pain, unspecified site 10/18/2012   Nausea alone 10/18/2012   Unspecified constipation 10/18/2012   Depression 10/18/2012   Anxiety    ASCUS (atypical squamous cells of undetermined significance) on Pap smear    IUD    Hemorrhoids 11/29/2010   Abdominal pain, left upper quadrant 11/29/2010    PCP:  Lupita Raider, MD  REFERRING PROVIDER: Lupita Raider, MD  REFERRING DIAG: fibromyalgia, TMJ dysfunction (added 04/20/22)  THERAPY  DIAG:  Abnormal posture  Muscle weakness (generalized)  Cramp and spasm  Cervicalgia  Polymyalgia rheumatica (HCC)  Rationale for Evaluation and Treatment Rehabilitation  ONSET DATE: chronic pain (fibromyalgia) with flare-up 2 months ago  SUBJECTIVE:                                                                                                                                                                                                         SUBJECTIVE STATEMENT: Almost out of this bad fibro flare completely. I think the water exercise is going to be good for me to move. DN was very helpful.    PERTINENT HISTORY:  Anxiety, bipolar disorder, depression, fibromyalgia, IBS, migraines, osteopenia, PTSD  PAIN:  Are you having pain: yes Pain location: hips Bil TMJ 0/10- chewing sometimes just hurts  Pain description: irritating  Aggravating factors: doing too much Relieving factors: muscle relaxers, rest, stretching  PRECAUTIONS: Other: chronic pain syndrome and depression Other: slow progression with exercise due to chronic condition  WEIGHT BEARING RESTRICTIONS No  FALLS:  Has patient fallen in last 6 months? No  LIVING ENVIRONMENT: Lives with: lives with their family Lives in: House/apartment  OCCUPATION: on disability  PLOF: Independent with basic ADLs and Leisure: yardwork, walking  Pt cares for her 4 young grandchildren- difficulty with care including lifting and carrying  PATIENT GOALS be more active with less pain, exercise at the gym regularly, lift and carry grandchildren and laundry, sleep with fewer interruptions, get back to more gardening.  OBJECTIVE:   DIAGNOSTIC FINDINGS: none recent   PATIENT SURVEYS: Eval: Fibromyalgia Impact Questionnaire: first  2 sections only: 49 12/26/21: 40  02/28/22: 28 (first 2 sections only)  COGNITION: Overall cognitive status: Within functional limits for tasks assessed  SENSATION: WFL  POSTURE: rounded  shoulders and forward head  PALPATION: Diffuse palpable tenderness over bil neck, upper traps, thoracic, lumbar and gluteals with trigger points.    CERVICAL ROM:   Active ROM A/PROM (deg) eval A/ROM 12/26/21 A/ROM 04/20/22 A/ROM 08/07/22  Flexion 55   60  Extension 40     Right lateral flexion 30 35 40 41  Left lateral flexion 35 40 45 50  Right rotation 50 60 65 70  Left rotation 40 65 65 75   (Blank rows = not tested) TMJ: opening limited by 25%, clicking with opening on the Rt.  Palpable tenderness over Rt masseter and at TMJ UPPER EXTREMITY ROM: UE A/ROM is limited by 20% into flexion and abduction with pain at end range.  Hip flexibility is limited by 25% in all directions with pain in all directions.  UPPER EXTREMITY MMT: UE: 4+/5, LE 4+/5 except hip flexors 4-/5  TODAY'S TREATMENT:   10/13/22:Pt arrives for aquatic physical therapy. Treatment took place in 3.5-5.5 feet of water. Water temperature was 91 degrees F. Pt entered the pool via stairs reciprocally with mild use of rails. Pt requires buoyancy of water for support and to offload joints with strengthening exercises. Pt utilizes viscosity of the water required for strengthening. Seated water bench with 75% submersion Pt performed seated LE AROM exercises 20x in all planes,   Standing in 50%- 75% depth water pt performed water walking in all 4 directions 10x with ankle fins added back today. VC for only work at level she felt appropriate. UE single buoy wts used for push/pull with UE and ankle fins. 50% depth standing: knee ext Bil 15x with small noodle, hip 3 ways with ankle fins 15x required some UE for balance today, Underwater bicycle 5 min with noodle in horseback followed by 3 min  decompression float.    10/11/22: NuStep: Level 3 x 10 minutes for endurance and mobility-PT present to discuss progress. Ball roll outs forward and lateral x10 each Seated on ball: 3 way raises with 2# weights 2x10  Trigger Point  Dry-Needling  Treatment instructions: Expect mild to moderate muscle soreness. S/S of pneumothorax if dry needled over a lung field, and to seek immediate medical attention should they occur. Patient verbalized understanding of these instructions and education.   Patient Consent Given: Yes Education handout provided: Previously provided Muscles treated: bil cervical and thoracic multifidi, rhomboids, upper traps, Rt masseter Treatment response/outcome: Utilized skilled palpation to identify trigger points.  During dry needling able to palpate muscle twitch and muscle elongation  Elongation and release to bil neck and upper back. Skilled palpation and monitoring by PT during dry needling   HOME EXERCISE PROGRAM: Access Code: [email or web address] Access Code: [email or web address] URL: [email or web address] Date: 09/06/2022 Prepared by: Tresa Endo  - Standing Hip Abduction with Counter Support  - 1 x daily - 7 x weekly - 1-2 sets - 10 reps - Standing Hip Extension with Chair  - 1 x daily - 7 x weekly - 1-2 sets - 10 reps ASSESSMENT:  CLINICAL IMPRESSION: Pt arrives to aquatic PT feeling better, more energetic. She was able to increase her resistance mainly with her LE using the ankle fins. Pt also moving with increased speeds she would demo before this recent flare up.  OBJECTIVE IMPAIRMENTS decreased activity tolerance, decreased endurance, difficulty walking, decreased strength, increased muscle  spasms, impaired flexibility, improper body mechanics, postural dysfunction, and pain.   ACTIVITY LIMITATIONS carrying, lifting, sitting, standing, reach over head, hygiene/grooming, and locomotion level  PARTICIPATION LIMITATIONS: meal prep, cleaning, laundry, driving, community activity, and yard work  PERSONAL FACTORS Past/current experiences, Time since onset of injury/illness/exacerbation, and 3+ comorbidities: fibromyalgia depression, anxiety,   are also affecting patient's functional outcome.   REHAB  POTENTIAL: Good  CLINICAL DECISION MAKING: Evolving/moderate complexity  EVALUATION COMPLEXITY: Moderate   GOALS: Goals reviewed with patient? Yes  SHORT TERM GOALS: Target date: 03/20/22  Return to regular performance of HEP 2-3x/day to improve mobility  Baseline: stretching 2x/day (02/28/22) Goal status: In progress  2.  Report > or = to 25% reduction in cervical and lumbar pain with ADLs and self-care  Baseline: 20% better overall (02/28/2022) Goal status: in progress   3.  Reduce fibromyalgia impact score to < or = to 39  Baseline: 28 (02/28/22) Goal status: MET    LONG TERM GOALS: Target date: 11/22/22  Be independent in advanced HEP Baseline: gentle exercise and able to progress depending on pain level (08/07/22) Goal status: In progress   2.  Reduce fibromyalgia impact questionnaire to < or = to 21 Baseline: 28 (02/28/22) Goal status: Revised   3.  Lift and carry laundry and her grandchildren with > or = to 70% reduction in pain Baseline: shoulder and neck and low back 50% (08/07/22) Goal status: In progress   4.  Report > or = to 60% reduction in neck and lumbar pain with ADLs and self-care  Baseline: 50% (08/07/22) Goal status: revised   5.  Return to yardwork verbalize appropriate rest breaks and mechanics  Baseline: has been able to do gentle things, will work to do more (06/12/22) Goal status: In progress    6. Report > or = to 30% reduction in Rt jaw pain with chewing and talking   Baseline  pain is resolved at this time (06/12/22)  Goal Status: MET PLAN: PT FREQUENCY: 2x/week  PT DURATION: 8 weeks  PLANNED INTERVENTIONS: Therapeutic exercises, Therapeutic activity, Neuromuscular re-education, Balance training, Gait training, Patient/Family education, Self Care, Joint mobilization, Aquatic Therapy, Dry Needling, Spinal manipulation, Spinal mobilization, Cryotherapy, Moist heat, Taping, Traction, Manual therapy, and Re-evaluation  PLAN FOR NEXT SESSION: continue  aquatics, manual therapy to address pain, strength progression  Ane Payment, PTA 10/13/22 3:35 PM

## 2022-10-13 ENCOUNTER — Ambulatory Visit: Payer: Medicare Other | Admitting: Physical Therapy

## 2022-10-13 ENCOUNTER — Encounter: Payer: Self-pay | Admitting: Physical Therapy

## 2022-10-13 DIAGNOSIS — R293 Abnormal posture: Secondary | ICD-10-CM

## 2022-10-13 DIAGNOSIS — M6281 Muscle weakness (generalized): Secondary | ICD-10-CM

## 2022-10-13 DIAGNOSIS — M353 Polymyalgia rheumatica: Secondary | ICD-10-CM | POA: Diagnosis not present

## 2022-10-13 DIAGNOSIS — R252 Cramp and spasm: Secondary | ICD-10-CM | POA: Diagnosis not present

## 2022-10-13 DIAGNOSIS — M542 Cervicalgia: Secondary | ICD-10-CM | POA: Diagnosis not present

## 2022-10-17 ENCOUNTER — Ambulatory Visit (INDEPENDENT_AMBULATORY_CARE_PROVIDER_SITE_OTHER): Payer: Medicare Other | Admitting: Psychology

## 2022-10-17 DIAGNOSIS — F902 Attention-deficit hyperactivity disorder, combined type: Secondary | ICD-10-CM | POA: Diagnosis not present

## 2022-10-17 DIAGNOSIS — F4310 Post-traumatic stress disorder, unspecified: Secondary | ICD-10-CM | POA: Diagnosis not present

## 2022-10-17 DIAGNOSIS — F331 Major depressive disorder, recurrent, moderate: Secondary | ICD-10-CM | POA: Diagnosis not present

## 2022-10-17 DIAGNOSIS — F428 Other obsessive-compulsive disorder: Secondary | ICD-10-CM | POA: Diagnosis not present

## 2022-10-17 NOTE — Therapy (Deleted)
OUTPATIENT PHYSICAL THERAPY TREATMENT   Patient Name: Danielle Bruce MRN: [id number] DOB:February 11, 1961, 62 y.o., female Today's Date: 10/17/2022                                                     Past Medical History:  Diagnosis Date   Abdominal pain, unspecified site 10/18/2012   Anemia    Anxiety    Arthritis    osteoarthritis   ASCUS (atypical squamous cells of undetermined significance) on Pap smear 05/06/2005   NEG HIGH RISK HPV--C&B BIOPSY BENIGN 12/2005   Asthma    Bipolar 2 disorder (HCC)    Cancer (HCC)    skin cancer - basal cell   Colon polyps    Complication of anesthesia    anxious afterwards, will get headaches    Constipation    Depression    Fibromyalgia 10/2013   GERD (gastroesophageal reflux disease)    Hearing loss on left    Heart murmur    never had any problems   Hemorrhoids    High cholesterol    High risk HPV infection 08/2011   cytology negative   IBS (irritable bowel syndrome)    Insomnia    Lymphocytic colitis    Lymphoma (HCC)    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   Bone marrow biopsy showes 8% plasma cells IgA Lambda   Migraines    Nausea alone 10/18/2012   Osteoarthritis    Osteopenia    Peripheral neuropathy    PTSD (post-traumatic stress disorder)    Past Surgical History:  Procedure Laterality Date   BONE MARROW BIOPSY Left 12/18/2013   Plasma cell dyscrasia 8% population of plasma cells   BREAST BIOPSY Right    benign stereo   CESAREAN SECTION  18,84,16   CHOLECYSTECTOMY N/A 10/16/2012   Procedure: LAPAROSCOPIC CHOLECYSTECTOMY;  Surgeon: Clovis Pu. Cornett, MD;  Location: [facility];  Service: General;  Laterality: N/A;   COLONOSCOPY     numerous times   DILATION AND CURETTAGE OF UTERUS     ESOPHAGOGASTRODUODENOSCOPY     HEMORRHOID SURGERY  1993   x3   IUD REMOVAL  02/2015   Mirena   LAPAROSCOPIC LYSIS OF ADHESIONS N/A 10/16/2012   Procedure:  LAPAROSCOPIC LYSIS OF ADHESIONS;  Surgeon: Maisie Fus A. Cornett, MD;  Location: [facility];  Service: General;  Laterality: N/A;   LAPAROSCOPY N/A 10/16/2012   Procedure: LAPAROSCOPY DIAGNOSTIC;  Surgeon: Clovis Pu. Cornett, MD;  Location: [facility];  Service: General;  Laterality: N/A;   PELVIC LAPAROSCOPY     RADIOLOGY WITH ANESTHESIA N/A 12/16/2015   Procedure: MRI OF BRAIN WITH AND WITHOUT CONTRAST;  Surgeon: Medication Radiologist, MD;  Location: [facility];  Service: Radiology;  Laterality: N/A;   SHOULDER SURGERY  2007/2008   SPINE SURGERY  2010   cervical   Patient Active Problem List   Diagnosis Date Noted   GAD (generalized anxiety disorder) 12/26/2017   OCD (obsessive compulsive disorder) 12/26/2017   PTSD (post-traumatic stress disorder) 12/26/2017   DDD (degenerative disc disease), cervical 07/14/2016   Primary osteoarthritis of both feet 07/14/2016   Primary osteoarthritis of both hands 07/14/2016   Other fatigue 07/14/2016   History of IBS 07/14/2016   Osteopenia of multiple sites 07/14/2016   Fibromyalgia 01/13/2016   MGUS (monoclonal gammopathy of unknown significance) 12/23/2013  Chronic cholecystitis without calculus 10/18/2012   Abdominal pain, unspecified site 10/18/2012   Nausea alone 10/18/2012   Unspecified constipation 10/18/2012   Depression 10/18/2012   Anxiety    ASCUS (atypical squamous cells of undetermined significance) on Pap smear    IUD    Hemorrhoids 11/29/2010   Abdominal pain, left upper quadrant 11/29/2010    PCP:  Lupita Raider, MD  REFERRING PROVIDER: Lupita Raider, MD  REFERRING DIAG: fibromyalgia, TMJ dysfunction (added 04/20/22)  THERAPY DIAG:  No diagnosis found.  Rationale for Evaluation and Treatment Rehabilitation  ONSET DATE: chronic pain (fibromyalgia) with flare-up 2 months ago  SUBJECTIVE:                                                                                                                                                                                                          SUBJECTIVE STATEMENT: Almost out of this bad fibro flare completely. I think the water exercise is going to be good for me to move. DN was very helpful.    PERTINENT HISTORY:  Anxiety, bipolar disorder, depression, fibromyalgia, IBS, migraines, osteopenia, PTSD  PAIN:  Are you having pain: yes Pain location: hips Bil TMJ 0/10- chewing sometimes just hurts  Pain description: irritating  Aggravating factors: doing too much Relieving factors: muscle relaxers, rest, stretching  PRECAUTIONS: Other: chronic pain syndrome and depression Other: slow progression with exercise due to chronic condition  WEIGHT BEARING RESTRICTIONS No  FALLS:  Has patient fallen in last 6 months? No  LIVING ENVIRONMENT: Lives with: lives with their family Lives in: House/apartment  OCCUPATION: on disability  PLOF: Independent with basic ADLs and Leisure: yardwork, walking  Pt cares for her 4 young grandchildren- difficulty with care including lifting and carrying  PATIENT GOALS be more active with less pain, exercise at the gym regularly, lift and carry grandchildren and laundry, sleep with fewer interruptions, get back to more gardening.  OBJECTIVE:   DIAGNOSTIC FINDINGS: none recent   PATIENT SURVEYS: Eval: Fibromyalgia Impact Questionnaire: first 2 sections only: 49 12/26/21: 40  02/28/22: 28 (first 2 sections only)  COGNITION: Overall cognitive status: Within functional limits for tasks assessed  SENSATION: WFL  POSTURE: rounded shoulders and forward head  PALPATION: Diffuse palpable tenderness over bil neck, upper traps, thoracic, lumbar and gluteals with trigger points.    CERVICAL ROM:   Active ROM A/PROM (deg) eval A/ROM 12/26/21 A/ROM 04/20/22 A/ROM 08/07/22  Flexion 55   60  Extension 40     Right lateral flexion 30 35 40 41  Left lateral flexion 35  40 45 50  Right rotation 50 60 65 70  Left rotation 40 65 65 75   (Blank  rows = not tested) TMJ: opening limited by 25%, clicking with opening on the Rt.  Palpable tenderness over Rt masseter and at TMJ UPPER EXTREMITY ROM: UE A/ROM is limited by 20% into flexion and abduction with pain at end range.  Hip flexibility is limited by 25% in all directions with pain in all directions.  UPPER EXTREMITY MMT: UE: 4+/5, LE 4+/5 except hip flexors 4-/5  TODAY'S TREATMENT:   10/13/22:Pt arrives for aquatic physical therapy. Treatment took place in 3.5-5.5 feet of water. Water temperature was 91 degrees F. Pt entered the pool via stairs reciprocally with mild use of rails. Pt requires buoyancy of water for support and to offload joints with strengthening exercises. Pt utilizes viscosity of the water required for strengthening. Seated water bench with 75% submersion Pt performed seated LE AROM exercises 20x in all planes,   Standing in 50%- 75% depth water pt performed water walking in all 4 directions 10x with ankle fins added back today. VC for only work at level she felt appropriate. UE single buoy wts used for push/pull with UE and ankle fins. 50% depth standing: knee ext Bil 15x with small noodle, hip 3 ways with ankle fins 15x required some UE for balance today, Underwater bicycle 5 min with noodle in horseback followed by 3 min  decompression float.    10/11/22: NuStep: Level 3 x 10 minutes for endurance and mobility-PT present to discuss progress. Ball roll outs forward and lateral x10 each Seated on ball: 3 way raises with 2# weights 2x10  Trigger Point Dry-Needling  Treatment instructions: Expect mild to moderate muscle soreness. S/S of pneumothorax if dry needled over a lung field, and to seek immediate medical attention should they occur. Patient verbalized understanding of these instructions and education.   Patient Consent Given: Yes Education handout provided: Previously provided Muscles treated: bil cervical and thoracic multifidi, rhomboids, upper traps, Rt  masseter Treatment response/outcome: Utilized skilled palpation to identify trigger points.  During dry needling able to palpate muscle twitch and muscle elongation  Elongation and release to bil neck and upper back. Skilled palpation and monitoring by PT during dry needling   HOME EXERCISE PROGRAM: Access Code: [email or web address] Access Code: [email or web address] URL: [email or web address] Date: 09/06/2022 Prepared by: Tresa Endo  - Standing Hip Abduction with Counter Support  - 1 x daily - 7 x weekly - 1-2 sets - 10 reps - Standing Hip Extension with Chair  - 1 x daily - 7 x weekly - 1-2 sets - 10 reps ASSESSMENT:  CLINICAL IMPRESSION: Pt arrives to aquatic PT feeling better, more energetic. She was able to increase her resistance mainly with her LE using the ankle fins. Pt also moving with increased speeds she would demo before this recent flare up.  OBJECTIVE IMPAIRMENTS decreased activity tolerance, decreased endurance, difficulty walking, decreased strength, increased muscle spasms, impaired flexibility, improper body mechanics, postural dysfunction, and pain.   ACTIVITY LIMITATIONS carrying, lifting, sitting, standing, reach over head, hygiene/grooming, and locomotion level  PARTICIPATION LIMITATIONS: meal prep, cleaning, laundry, driving, community activity, and yard work  PERSONAL FACTORS Past/current experiences, Time since onset of injury/illness/exacerbation, and 3+ comorbidities: fibromyalgia depression, anxiety,   are also affecting patient's functional outcome.   REHAB POTENTIAL: Good  CLINICAL DECISION MAKING: Evolving/moderate complexity  EVALUATION COMPLEXITY: Moderate   GOALS: Goals reviewed with patient? Yes  SHORT TERM GOALS: Target date: 03/20/22  Return  to regular performance of HEP 2-3x/day to improve mobility  Baseline: stretching 2x/day (02/28/22) Goal status: In progress  2.  Report > or = to 25% reduction in cervical and lumbar pain with ADLs and self-care   Baseline: 20% better overall (02/28/2022) Goal status: in progress   3.  Reduce fibromyalgia impact score to < or = to 39  Baseline: 28 (02/28/22) Goal status: MET    LONG TERM GOALS: Target date: 11/22/22  Be independent in advanced HEP Baseline: gentle exercise and able to progress depending on pain level (08/07/22) Goal status: In progress   2.  Reduce fibromyalgia impact questionnaire to < or = to 21 Baseline: 28 (02/28/22) Goal status: Revised   3.  Lift and carry laundry and her grandchildren with > or = to 70% reduction in pain Baseline: shoulder and neck and low back 50% (08/07/22) Goal status: In progress   4.  Report > or = to 60% reduction in neck and lumbar pain with ADLs and self-care  Baseline: 50% (08/07/22) Goal status: revised   5.  Return to yardwork verbalize appropriate rest breaks and mechanics  Baseline: has been able to do gentle things, will work to do more (06/12/22) Goal status: In progress    6. Report > or = to 30% reduction in Rt jaw pain with chewing and talking   Baseline  pain is resolved at this time (06/12/22)  Goal Status: MET PLAN: PT FREQUENCY: 2x/week  PT DURATION: 8 weeks  PLANNED INTERVENTIONS: Therapeutic exercises, Therapeutic activity, Neuromuscular re-education, Balance training, Gait training, Patient/Family education, Self Care, Joint mobilization, Aquatic Therapy, Dry Needling, Spinal manipulation, Spinal mobilization, Cryotherapy, Moist heat, Taping, Traction, Manual therapy, and Re-evaluation  PLAN FOR NEXT SESSION: continue aquatics, manual therapy to address pain, strength progression  Ane Payment, PTA 10/17/22 9:12 PM

## 2022-10-18 ENCOUNTER — Ambulatory Visit: Payer: Medicare Other | Admitting: Physical Therapy

## 2022-10-18 NOTE — Progress Notes (Signed)
[facility] Counselor/Therapist Progress Note  Patient ID: Danielle Bruce, MRN: [id number],    Date: 10/17/2022  Time Spent: 60 minutes Time In:  3:00  Time out: 4:00  Treatment Type: Individual Therapy  Reported Symptoms: flashbacks, responding to triggers, depression, anxiety, being busy all of the time, dissociation  Mental Status Exam: Appearance:  Casual     Behavior: Appropriate  Motor: Restlestness  Speech/Language:  Clear and Coherent  Affect: Blunt  Mood: anxious  Thought process: normal  Thought content:   WNL  Sensory/Perceptual disturbances:   WNL  Orientation: oriented to person, place, time/date, and situation  Attention: Good  Concentration: Good  Memory: WNL  Fund of knowledge:  Good  Insight:   Good  Judgment:  Fair  Impulse Control: Good   Risk Assessment: Danger to Self:  No Self-injurious Behavior: No Danger to Others: No Duty to Warn:no Physical Aggression / Violence:No  Access to Firearms a concern: No  Gang Involvement:No   Subjective: The patient attended a face-to-face individual therapy session in the office today.  The patient presents as pleasant and cooperative.   The patient does present as a little anxious today.  The patient talked today about living in her household and how her mother has changed some since her father passed away.  She is frustrated because it feels like to her that her mother is showing some favoritism to her siblings and that is very frustrating.  We talked about the need for her to possibly figure out what it is that her parents have in place for her when her mother passes away.  The patient is fearful that her brother will still want to live in the house with her and that she will end up having to take care of him and he is grown.  I encouraged her to be mindful of the dynamics and we talked more about how to respond to these family dynamics during her next session.   Interventions: Cognitive Behavioral  Therapy, Mindfulness Meditation, Eye Movement Desensitization and Reprocessing (EMDR), Insight-Oriented, and Interpersonal, psychoeducation  Diagnosis:Attention deficit hyperactivity disorder (ADHD), combined type  PTSD (post-traumatic stress disorder)  Obsessional thoughts  Major depressive disorder, recurrent episode, moderate (HCC)  Plan: Plan of Care: Client Abilities/Strengths  Insightful, motivated, Supportive family Client Treatment Preferences  Outpatient Individual therapy/EMDR  Client Statement of Needs  "I think I have PTSD and I feel like I need another kind of therapy than talk therapy" Treatment Level  Outpatient Individual therapy  Symptoms  Demonstrates an exaggerated startle response.:  (Status: maintained). Depressed  or irritable mood.: (Status:maintained). Describes a reliving of the event,  particularly through dissociative flashbacks.: (Status: maintained). Displays a  significant decline in interest and engagement in activities.:  (Status: maintained). Displays significant psychological and/or physiological distress resulting from internal and external  clues that are reminiscent of the traumatic event.: (Status: maintained).  Experiences disturbances in sleep.:  (Status: maintained). Experiences disturbing  and persistent thoughts, images, and/or perceptions of the traumatic event.:  (Status: maintained). Experiences frequent nightmares.: (Status: maintained).  Feelings of hopelessness, worthlessness, or inappropriate guilt.: No Description Entered (Status:  improved). Has been exposed to a traumatic event involving actual or perceived threat of death or  serious injury.:  (Status: maintained). Impairment in social, occupational, or  other areas of functioning.:(Status: maintained). Intentionally avoids activities,  places, people, or objects (e.g., up-armored vehicles) that evoke memories of the event.: (Status: maintained). Intentionally avoids thoughts,  feelings, or discussions related  to  the traumatic event.: (Status: maintained). Reports difficulty concentrating as  well as feelings of guilt (Status:maintained). Reports response of intense fear,  helplessness, or horror to the traumatic event.:  (Status: maintained).  Problems Addressed  Unipolar Depression, Posttraumatic Stress Disorder (PTSD), Posttraumatic Stress Disorder (PTSD),   Posttraumatic Stress Disorder (PTSD), Posttraumatic Stress Disorder (PTSD)  Goals 1. Develop healthy thinking patterns and beliefs about self, others, and the world that lead to the alleviation and help prevent the relapse of  depression. Objective Identify and replace thoughts and beliefs that support depression. Target Date: 2023/01/04 Frequency: Weekly Progress: 20 Modality: individual Related Interventions 1. Explore and restructure underlying assumptions and beliefs reflected in biased self-talk that  may put the client at risk for relapse or recurrence. 2. Conduct Cognitive-Behavioral Therapy (see Cognitive Behavior Therapy by Reola Calkins; Overcoming Depression by Agapito Games al.), beginning with helping the client learn the connection among  cognition, depressive feelings, and actions. 2. Eliminate or reduce the negative impact trauma related symptoms have  on social, occupational, and family functioning. Objective Learn and implement personal skills to manage challenging situations related to trauma. Target Date: 2023/01/04 Frequency: Weekly Progress: 0 Modality: individual 3. No longer avoids persons, places, activities, and objects that are  reminiscent of the traumatic event. Objective Participate in Eye Movement Desensitization and Reprocessing (EMDR) to reduce emotional distress  related to traumatic thoughts, feelings, and images. Target Date: 2023/01/04 Frequency: Weekly Progress: 0 Modality: individual   Related Interventions 1. Utilize Eye Movement Desensitization and Reprocessing  (EMDR) to reduce the client's  emotional reactivity to the traumatic event and reduce PTSD symptoms. Objective Learn and implement guided self-dialogue to manage thoughts, feelings, and urges brought on by  encounters with trauma-related situations. Target Date: 2024-11/07 Frequency: Weekly Progress: 10 Modality: individual Related Interventions 1. Teach the client a guided self-dialogue procedure in which he/she learns to recognize  maladaptive self-talk, challenges its biases, copes with engendered feelings, overcomes  avoidance, and reinforces his/her accomplishments; review and reinforce progress, problemsolve obstacles. 4. No longer experiences intrusive event recollections, avoidance of event  reminders, intense arousal, or disinterest in activities or  relationships. 5. Thinks about or openly discusses the traumatic event with others  without experiencing psychological or physiological distress. Diagnosis Axis  none F43.10 (Posttraumatic stress disorder) - Open - [Signifier: n/a] Posttraumatic Stress  Disorder  Conditions For Discharge Achievement of treatment goals and objectives   Dax Murguia G Sherrita Riederer, LCSW

## 2022-10-19 ENCOUNTER — Ambulatory Visit: Payer: Medicare Other

## 2022-10-19 DIAGNOSIS — R252 Cramp and spasm: Secondary | ICD-10-CM

## 2022-10-19 DIAGNOSIS — M542 Cervicalgia: Secondary | ICD-10-CM | POA: Diagnosis not present

## 2022-10-19 DIAGNOSIS — M353 Polymyalgia rheumatica: Secondary | ICD-10-CM

## 2022-10-19 DIAGNOSIS — M6281 Muscle weakness (generalized): Secondary | ICD-10-CM

## 2022-10-19 DIAGNOSIS — R293 Abnormal posture: Secondary | ICD-10-CM | POA: Diagnosis not present

## 2022-10-19 NOTE — Therapy (Signed)
OUTPATIENT PHYSICAL THERAPY TREATMENT   Patient Name: Danielle Bruce MRN: [id number] DOB:1960/03/15, 62 y.o., female Today's Date: 10/19/2022 Progress Note Reporting Period 08/09/22 to 10/19/22  See note below for Objective Data and Assessment of Progress/Goals.      PT End of Session - 10/19/22 1248     Visit Number 58    Date for PT Re-Evaluation 11/22/22    Authorization Type Medicare B- KX now    Progress Note Due on Visit 68    PT Start Time 1235    PT Stop Time 1317    PT Time Calculation (min) 42 min    Activity Tolerance Patient tolerated treatment well    Behavior During Therapy WFL for tasks assessed/performed                PT End of Session - 10/19/22 1248     Visit Number 58    Date for PT Re-Evaluation 11/22/22    Authorization Type Medicare B- KX now    Progress Note Due on Visit 68    PT Start Time 1235    PT Stop Time 1317    PT Time Calculation (min) 42 min    Activity Tolerance Patient tolerated treatment well    Behavior During Therapy WFL for tasks assessed/performed                                                           Past Medical History:  Diagnosis Date   Abdominal pain, unspecified site 10/18/2012   Anemia    Anxiety    Arthritis    osteoarthritis   ASCUS (atypical squamous cells of undetermined significance) on Pap smear 05/06/2005   NEG HIGH RISK HPV--C&B BIOPSY BENIGN 12/2005   Asthma    Bipolar 2 disorder (HCC)    Cancer (HCC)    skin cancer - basal cell   Colon polyps    Complication of anesthesia    anxious afterwards, will get headaches    Constipation    Depression    Fibromyalgia 10/2013   GERD (gastroesophageal reflux disease)    Hearing loss on left    Heart murmur    never had any problems   Hemorrhoids    High cholesterol    High risk HPV infection 08/2011   cytology negative   IBS (irritable bowel syndrome)    Insomnia    Lymphocytic colitis     Lymphoma (HCC)    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   Bone marrow biopsy showes 8% plasma cells IgA Lambda   Migraines    Nausea alone 10/18/2012   Osteoarthritis    Osteopenia    Peripheral neuropathy    PTSD (post-traumatic stress disorder)    Past Surgical History:  Procedure Laterality Date   BONE MARROW BIOPSY Left 12/18/2013   Plasma cell dyscrasia 8% population of plasma cells   BREAST BIOPSY Right    benign stereo   CESAREAN SECTION  44,01,02   CHOLECYSTECTOMY N/A 10/16/2012   Procedure: LAPAROSCOPIC CHOLECYSTECTOMY;  Surgeon: Clovis Pu. Cornett, MD;  Location: [facility];  Service: General;  Laterality: N/A;   COLONOSCOPY     numerous times   DILATION AND CURETTAGE OF UTERUS     ESOPHAGOGASTRODUODENOSCOPY     HEMORRHOID SURGERY  1993   x3  IUD REMOVAL  02/2015   Mirena   LAPAROSCOPIC LYSIS OF ADHESIONS N/A 10/16/2012   Procedure: LAPAROSCOPIC LYSIS OF ADHESIONS;  Surgeon: Maisie Fus A. Cornett, MD;  Location: [facility];  Service: General;  Laterality: N/A;   LAPAROSCOPY N/A 10/16/2012   Procedure: LAPAROSCOPY DIAGNOSTIC;  Surgeon: Clovis Pu. Cornett, MD;  Location: [facility];  Service: General;  Laterality: N/A;   PELVIC LAPAROSCOPY     RADIOLOGY WITH ANESTHESIA N/A 12/16/2015   Procedure: MRI OF BRAIN WITH AND WITHOUT CONTRAST;  Surgeon: Medication Radiologist, MD;  Location: [facility];  Service: Radiology;  Laterality: N/A;   SHOULDER SURGERY  2007/2008   SPINE SURGERY  2010   cervical   Patient Active Problem List   Diagnosis Date Noted   GAD (generalized anxiety disorder) 12/26/2017   OCD (obsessive compulsive disorder) 12/26/2017   PTSD (post-traumatic stress disorder) 12/26/2017   DDD (degenerative disc disease), cervical 07/14/2016   Primary osteoarthritis of both feet 07/14/2016   Primary osteoarthritis of both hands 07/14/2016   Other fatigue 07/14/2016   History of IBS 07/14/2016   Osteopenia of multiple sites 07/14/2016   Fibromyalgia  01/13/2016   MGUS (monoclonal gammopathy of unknown significance) 12/23/2013   Chronic cholecystitis without calculus 10/18/2012   Abdominal pain, unspecified site 10/18/2012   Nausea alone 10/18/2012   Unspecified constipation 10/18/2012   Depression 10/18/2012   Anxiety    ASCUS (atypical squamous cells of undetermined significance) on Pap smear    IUD    Hemorrhoids 11/29/2010   Abdominal pain, left upper quadrant 11/29/2010    PCP:  Lupita Raider, MD  REFERRING PROVIDER: Lupita Raider, MD  REFERRING DIAG: fibromyalgia, TMJ dysfunction (added 04/20/22)  THERAPY DIAG:  Abnormal posture  Muscle weakness (generalized)  Cramp and spasm  Cervicalgia  Polymyalgia rheumatica (HCC)  Rationale for Evaluation and Treatment Rehabilitation  ONSET DATE: chronic pain (fibromyalgia) with flare-up 2 months ago  SUBJECTIVE:                                                                                                                                                                                                         SUBJECTIVE STATEMENT: I'm going through some tough stuff with my trauma therapy so I have had pain and less sleep.  The needling gave me good relief.  I have tension in my thoracic spine and neck.   PERTINENT HISTORY:  Anxiety, bipolar disorder, depression, fibromyalgia, IBS, migraines, osteopenia, PTSD  PAIN:  Are you having pain: yes Pain location: hips Bil TMJ 0/10- chewing sometimes just hurts  Pain description:  irritating  Aggravating factors: doing too much Relieving factors: muscle relaxers, rest, stretching  PRECAUTIONS: Other: chronic pain syndrome and depression Other: slow progression with exercise due to chronic condition  WEIGHT BEARING RESTRICTIONS No  FALLS:  Has patient fallen in last 6 months? No  LIVING ENVIRONMENT: Lives with: lives with their family Lives in: House/apartment  OCCUPATION: on disability  PLOF: Independent with basic  ADLs and Leisure: yardwork, walking  Pt cares for her 4 young grandchildren- difficulty with care including lifting and carrying  PATIENT GOALS be more active with less pain, exercise at the gym regularly, lift and carry grandchildren and laundry, sleep with fewer interruptions, get back to more gardening.  OBJECTIVE:   DIAGNOSTIC FINDINGS: none recent   PATIENT SURVEYS: Eval: Fibromyalgia Impact Questionnaire: first 2 sections only: 49 12/26/21: 40  02/28/22: 28 (first 2 sections only)  COGNITION: Overall cognitive status: Within functional limits for tasks assessed  SENSATION: WFL  POSTURE: rounded shoulders and forward head  PALPATION: Diffuse palpable tenderness over bil neck, upper traps, thoracic, lumbar and gluteals with trigger points.    CERVICAL ROM:   Active ROM A/PROM (deg) eval A/ROM 12/26/21 A/ROM 04/20/22 A/ROM 08/07/22  Flexion 55   60  Extension 40     Right lateral flexion 30 35 40 41  Left lateral flexion 35 40 45 50  Right rotation 50 60 65 70  Left rotation 40 65 65 75   (Blank rows = not tested) TMJ: opening limited by 25%, clicking with opening on the Rt.  Palpable tenderness over Rt masseter and at TMJ UPPER EXTREMITY ROM: UE A/ROM is limited by 20% into flexion and abduction with pain at end range.  Hip flexibility is limited by 25% in all directions with pain in all directions.  UPPER EXTREMITY MMT: UE: 4+/5, LE 4+/5 except hip flexors 4-/5  TODAY'S TREATMENT:  10/19/22: NuStep: Level 3 x 10 minutes for endurance and mobility-PT present to discuss progress. Ball roll outs forward and lateral x10 each Seated on ball: 3 way raises with 2# weights 2x10  Sit to stand 5# kettle bell 2x10 Sit to stand with upright row: 5# kettle bell Sit to stand with chest press: kettle bell 2x10   10/13/22:Pt arrives for aquatic physical therapy. Treatment took place in 3.5-5.5 feet of water. Water temperature was 91 degrees F. Pt entered the pool via stairs  reciprocally with mild use of rails. Pt requires buoyancy of water for support and to offload joints with strengthening exercises. Pt utilizes viscosity of the water required for strengthening. Seated water bench with 75% submersion Pt performed seated LE AROM exercises 20x in all planes,   Standing in 50%- 75% depth water pt performed water walking in all 4 directions 10x with ankle fins added back today. VC for only work at level she felt appropriate. UE single buoy wts used for push/pull with UE and ankle fins. 50% depth standing: knee ext Bil 15x with small noodle, hip 3 ways with ankle fins 15x required some UE for balance today, Underwater bicycle 5 min with noodle in horseback followed by 3 min  decompression float.    10/11/22: NuStep: Level 3 x 10 minutes for endurance and mobility-PT present to discuss progress. Ball roll outs forward and lateral x10 each Seated on ball: 3 way raises with 2# weights 2x10  Trigger Point Dry-Needling  Treatment instructions: Expect mild to moderate muscle soreness. S/S of pneumothorax if dry needled over a lung field, and to seek immediate medical  attention should they occur. Patient verbalized understanding of these instructions and education.   Patient Consent Given: Yes Education handout provided: Previously provided Muscles treated: bil cervical and thoracic multifidi, rhomboids, upper traps, Rt masseter Treatment response/outcome: Utilized skilled palpation to identify trigger points.  During dry needling able to palpate muscle twitch and muscle elongation  Elongation and release to bil neck and upper back. Skilled palpation and monitoring by PT during dry needling   HOME EXERCISE PROGRAM: Access Code: [email or web address] Access Code: [email or web address] URL: [email or web address] Date: 09/06/2022 Prepared by: Tresa Endo                - Standing Hip Abduction with Counter Support  - 1 x daily - 7 x weekly - 1-2 sets - 10 reps - Standing Hip Extension  with Chair  - 1 x daily - 7 x weekly - 1-2 sets - 10 reps ASSESSMENT:  CLINICAL IMPRESSION: Pt has worked to increase her activity as she is coming out her fibromyalgia flare. Pt did well with addition of new exercises and PT added to HEP. No increased pain today with activity.  Due to complexity of medical condition, skilled PT is appropriate for guidance and adjustment to program as current symptoms change.  She is working in her yard and taking appropriate breaks.  Patient will benefit from skilled PT to address the below impairments and improve overall function.   OBJECTIVE IMPAIRMENTS decreased activity tolerance, decreased endurance, difficulty walking, decreased strength, increased muscle spasms, impaired flexibility, improper body mechanics, postural dysfunction, and pain.   ACTIVITY LIMITATIONS carrying, lifting, sitting, standing, reach over head, hygiene/grooming, and locomotion level  PARTICIPATION LIMITATIONS: meal prep, cleaning, laundry, driving, community activity, and yard work  PERSONAL FACTORS Past/current experiences, Time since onset of injury/illness/exacerbation, and 3+ comorbidities: fibromyalgia depression, anxiety,   are also affecting patient's functional outcome.   REHAB POTENTIAL: Good  CLINICAL DECISION MAKING: Evolving/moderate complexity  EVALUATION COMPLEXITY: Moderate   GOALS: Goals reviewed with patient? Yes  SHORT TERM GOALS: Target date: 03/20/22  Return to regular performance of HEP 2-3x/day to improve mobility  Baseline: stretching 2x/day (02/28/22) Goal status: In progress  2.  Report > or = to 25% reduction in cervical and lumbar pain with ADLs and self-care  Baseline: 20% better overall (02/28/2022) Goal status: in progress   3.  Reduce fibromyalgia impact score to < or = to 39  Baseline: 28 (02/28/22) Goal status: MET    LONG TERM GOALS: Target date: 11/22/22  Be independent in advanced HEP Baseline: recent flare up of fibromyalgia so not  as consistent recently (10/19/22) Goal status: In progress   2.  Reduce fibromyalgia impact questionnaire to < or = to 21 Baseline: 28 (02/28/22) Goal status: Revised   3.  Lift and carry laundry and her grandchildren with > or = to 70% reduction in pain Baseline: shoulder and neck and low back 50% (10/19/22) Goal status: In progress   4.  Report > or = to 60% reduction in neck and lumbar pain with ADLs and self-care  Baseline: 50% (08/07/22) Goal status: revised   5.  Return to yardwork verbalize appropriate rest breaks and mechanics  Baseline: has worked in garden intermittently and is taking breaks (10/19/22) Goal status: In progress    6. Report > or = to 30% reduction in Rt jaw pain with chewing and talking   Baseline  jaw has been tight but no longer painful (10/19/22)  Goal Status: MET PLAN: PT FREQUENCY: 2x/week  PT DURATION: 8  weeks  PLANNED INTERVENTIONS: Therapeutic exercises, Therapeutic activity, Neuromuscular re-education, Balance training, Gait training, Patient/Family education, Self Care, Joint mobilization, Aquatic Therapy, Dry Needling, Spinal manipulation, Spinal mobilization, Cryotherapy, Moist heat, Taping, Traction, Manual therapy, and Re-evaluation  PLAN FOR NEXT SESSION: continue aquatics, manual therapy to address pain, strength progression  Lorrene Reid, PT 10/19/22 1:21 PM

## 2022-10-23 ENCOUNTER — Other Ambulatory Visit: Payer: Self-pay | Admitting: Psychiatry

## 2022-10-24 ENCOUNTER — Ambulatory Visit: Payer: Medicare Other

## 2022-10-24 ENCOUNTER — Ambulatory Visit (INDEPENDENT_AMBULATORY_CARE_PROVIDER_SITE_OTHER): Payer: Medicare Other | Admitting: Psychology

## 2022-10-24 ENCOUNTER — Other Ambulatory Visit (HOSPITAL_BASED_OUTPATIENT_CLINIC_OR_DEPARTMENT_OTHER): Payer: Self-pay

## 2022-10-24 DIAGNOSIS — F428 Other obsessive-compulsive disorder: Secondary | ICD-10-CM | POA: Diagnosis not present

## 2022-10-24 DIAGNOSIS — R252 Cramp and spasm: Secondary | ICD-10-CM | POA: Diagnosis not present

## 2022-10-24 DIAGNOSIS — F902 Attention-deficit hyperactivity disorder, combined type: Secondary | ICD-10-CM | POA: Diagnosis not present

## 2022-10-24 DIAGNOSIS — F331 Major depressive disorder, recurrent, moderate: Secondary | ICD-10-CM

## 2022-10-24 DIAGNOSIS — M353 Polymyalgia rheumatica: Secondary | ICD-10-CM | POA: Diagnosis not present

## 2022-10-24 DIAGNOSIS — F4310 Post-traumatic stress disorder, unspecified: Secondary | ICD-10-CM | POA: Diagnosis not present

## 2022-10-24 DIAGNOSIS — M6281 Muscle weakness (generalized): Secondary | ICD-10-CM | POA: Diagnosis not present

## 2022-10-24 DIAGNOSIS — R293 Abnormal posture: Secondary | ICD-10-CM | POA: Diagnosis not present

## 2022-10-24 DIAGNOSIS — M542 Cervicalgia: Secondary | ICD-10-CM | POA: Diagnosis not present

## 2022-10-24 MED ORDER — LISDEXAMFETAMINE DIMESYLATE 50 MG PO CAPS
50.0000 mg | ORAL_CAPSULE | Freq: Every day | ORAL | 0 refills | Status: DC
  Filled 2022-10-24 – 2022-10-31 (×2): qty 30, 30d supply, fill #0

## 2022-10-24 NOTE — Therapy (Signed)
OUTPATIENT PHYSICAL THERAPY TREATMENT   Patient Name: Danielle Bruce MRN: [id number] DOB:[birth year], 62 y.o., female Today's Date: 10/24/2022  PT End of Session - 10/24/22 1235     Visit Number 59    Authorization Type Medicare B- KX now    PT Start Time 1146    PT Stop Time 1230    PT Time Calculation (min) 44 min    Activity Tolerance Patient tolerated treatment well    Behavior During Therapy [facility] for tasks assessed/performed                 PT End of Session - 10/24/22 1235     Visit Number 59    Authorization Type Medicare B- KX now    PT Start Time 1146    PT Stop Time 1230    PT Time Calculation (min) 44 min    Activity Tolerance Patient tolerated treatment well    Behavior During Therapy WFL for tasks assessed/performed                                                            Past Medical History:  Diagnosis Date   Abdominal pain, unspecified site 10/18/2012   Anemia    Anxiety    Arthritis    osteoarthritis   ASCUS (atypical squamous cells of undetermined significance) on Pap smear 05/06/2005   NEG HIGH RISK HPV--C&B BIOPSY BENIGN 12/2005   Asthma    Bipolar 2 disorder (HCC)    Cancer (HCC)    skin cancer - basal cell   Colon polyps    Complication of anesthesia    anxious afterwards, will get headaches    Constipation    Depression    Fibromyalgia 10/2013   GERD (gastroesophageal reflux disease)    Hearing loss on left    Heart murmur    never had any problems   Hemorrhoids    High cholesterol    High risk HPV infection 08/2011   cytology negative   IBS (irritable bowel syndrome)    Insomnia    Lymphocytic colitis    Lymphoma (HCC)    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   Bone marrow biopsy showes 8% plasma cells IgA Lambda   Migraines    Nausea alone 10/18/2012   Osteoarthritis    Osteopenia    Peripheral neuropathy    PTSD (post-traumatic stress disorder)     Past Surgical History:  Procedure Laterality Date   BONE MARROW BIOPSY Left 12/18/2013   Plasma cell dyscrasia 8% population of plasma cells   BREAST BIOPSY Right    benign stereo   CESAREAN SECTION  86,57,84   CHOLECYSTECTOMY N/A 10/16/2012   Procedure: LAPAROSCOPIC CHOLECYSTECTOMY;  Surgeon: Clovis Pu. Cornett, MD;  Location: [facility];  Service: General;  Laterality: N/A;   COLONOSCOPY     numerous times   DILATION AND CURETTAGE OF UTERUS     ESOPHAGOGASTRODUODENOSCOPY     HEMORRHOID SURGERY  1993   x3   IUD REMOVAL  02/2015   Mirena   LAPAROSCOPIC LYSIS OF ADHESIONS N/A 10/16/2012   Procedure: LAPAROSCOPIC LYSIS OF ADHESIONS;  Surgeon: Maisie Fus A. Cornett, MD;  Location: [facility];  Service: General;  Laterality: N/A;   LAPAROSCOPY N/A 10/16/2012   Procedure: LAPAROSCOPY DIAGNOSTIC;  Surgeon: Clovis Pu.  Cornett, MD;  Location: [facility];  Service: General;  Laterality: N/A;   PELVIC LAPAROSCOPY     RADIOLOGY WITH ANESTHESIA N/A 12/16/2015   Procedure: MRI OF BRAIN WITH AND WITHOUT CONTRAST;  Surgeon: Medication Radiologist, MD;  Location: [facility];  Service: Radiology;  Laterality: N/A;   SHOULDER SURGERY  2007/2008   SPINE SURGERY  2010   cervical   Patient Active Problem List   Diagnosis Date Noted   GAD (generalized anxiety disorder) 12/26/2017   OCD (obsessive compulsive disorder) 12/26/2017   PTSD (post-traumatic stress disorder) 12/26/2017   DDD (degenerative disc disease), cervical 07/14/2016   Primary osteoarthritis of both feet 07/14/2016   Primary osteoarthritis of both hands 07/14/2016   Other fatigue 07/14/2016   History of IBS 07/14/2016   Osteopenia of multiple sites 07/14/2016   Fibromyalgia 01/13/2016   MGUS (monoclonal gammopathy of unknown significance) 12/23/2013   Chronic cholecystitis without calculus 10/18/2012   Abdominal pain, unspecified site 10/18/2012   Nausea alone 10/18/2012   Unspecified constipation 10/18/2012   Depression 10/18/2012   Anxiety     ASCUS (atypical squamous cells of undetermined significance) on Pap smear    IUD    Hemorrhoids 11/29/2010   Abdominal pain, left upper quadrant 11/29/2010    PCP:  Lupita Raider, MD  REFERRING PROVIDER: Lupita Raider, MD  REFERRING DIAG: fibromyalgia, TMJ dysfunction (added 04/20/22)  THERAPY DIAG:  Abnormal posture  Muscle weakness (generalized)  Cramp and spasm  Cervicalgia  Rationale for Evaluation and Treatment Rehabilitation  ONSET DATE: chronic pain (fibromyalgia) with flare-up 2 months ago  SUBJECTIVE:                                                                                                                                                                                                         SUBJECTIVE STATEMENT: I have been doing yard work and taking breaks as needed.  I have been stretching more and want to review these today.   PERTINENT HISTORY:  Anxiety, bipolar disorder, depression, fibromyalgia, IBS, migraines, osteopenia, PTSD  PAIN:  Are you having pain: yes Pain location: hips Bil TMJ 0/10- chewing sometimes just hurts  Pain description: irritating  Aggravating factors: doing too much Relieving factors: muscle relaxers, rest, stretching  PRECAUTIONS: Other: chronic pain syndrome and depression Other: slow progression with exercise due to chronic condition  WEIGHT BEARING RESTRICTIONS No  FALLS:  Has patient fallen in last 6 months? No  LIVING ENVIRONMENT: Lives with: lives with their family Lives in: House/apartment  OCCUPATION: on disability  PLOF: Independent with basic ADLs  and Leisure: yardwork, walking  Pt cares for her 4 young grandchildren- difficulty with care including lifting and carrying  PATIENT GOALS be more active with less pain, exercise at the gym regularly, lift and carry grandchildren and laundry, sleep with fewer interruptions, get back to more gardening.  OBJECTIVE:   DIAGNOSTIC FINDINGS: none recent    PATIENT SURVEYS: Eval: Fibromyalgia Impact Questionnaire: first 2 sections only: 49 12/26/21: 40  02/28/22: 28 (first 2 sections only)  COGNITION: Overall cognitive status: Within functional limits for tasks assessed  SENSATION: WFL  POSTURE: rounded shoulders and forward head  PALPATION: Diffuse palpable tenderness over bil neck, upper traps, thoracic, lumbar and gluteals with trigger points.    CERVICAL ROM:   Active ROM A/PROM (deg) eval A/ROM 12/26/21 A/ROM 04/20/22 A/ROM 08/07/22  Flexion 55   60  Extension 40     Right lateral flexion 30 35 40 41  Left lateral flexion 35 40 45 50  Right rotation 50 60 65 70  Left rotation 40 65 65 75   (Blank rows = not tested) TMJ: opening limited by 25%, clicking with opening on the Rt.  Palpable tenderness over Rt masseter and at TMJ UPPER EXTREMITY ROM: UE A/ROM is limited by 20% into flexion and abduction with pain at end range.  Hip flexibility is limited by 25% in all directions with pain in all directions.  UPPER EXTREMITY MMT: UE: 4+/5, LE 4+/5 except hip flexors 4-/5  TODAY'S TREATMENT:  10/24/22: NuStep: Level 3 x 10 minutes for endurance and mobility-PT present to discuss progress. Ball roll outs forward and lateral x10 each Seated on ball: 3 way raises with 2# weights 2x10  Cat cow, challenged with thoracic extension so added seated thoracic extension with ball in chair Sit to stand 5# kettle bell 2x10 Sit to stand with upright row: 5# kettle bell Sit to stand with chest press: kettle bell 2x10  10/19/22: NuStep: Level 3 x 10 minutes for endurance and mobility-PT present to discuss progress. Ball roll outs forward and lateral x10 each Seated on ball: 3 way raises with 2# weights 2x10  Sit to stand 5# kettle bell 2x10 Sit to stand with upright row: 5# kettle bell Sit to stand with chest press: kettle bell 2x10   10/13/22:Pt arrives for aquatic physical therapy. Treatment took place in 3.5-5.5 feet of water.  Water temperature was 91 degrees F. Pt entered the pool via stairs reciprocally with mild use of rails. Pt requires buoyancy of water for support and to offload joints with strengthening exercises. Pt utilizes viscosity of the water required for strengthening. Seated water bench with 75% submersion Pt performed seated LE AROM exercises 20x in all planes,   Standing in 50%- 75% depth water pt performed water walking in all 4 directions 10x with ankle fins added back today. VC for only work at level she felt appropriate. UE single buoy wts used for push/pull with UE and ankle fins. 50% depth standing: knee ext Bil 15x with small noodle, hip 3 ways with ankle fins 15x required some UE for balance today, Underwater bicycle 5 min with noodle in horseback followed by 3 min  decompression float.    HOME EXERCISE PROGRAM: Access Code: [email or web address] Access Code: [email or web address] URL: [email or web address] Date: 09/06/2022 Prepared by: Tresa Endo                - Standing Hip Abduction with Counter Support  - 1 x daily - 7 x weekly - 1-2 sets - 10 reps -  Standing Hip Extension with Chair  - 1 x daily - 7 x weekly - 1-2 sets - 10 reps ASSESSMENT:  CLINICAL IMPRESSION: Pt has been more active with her exercises this week and has been doing yard work without significant increase in pain.  Due to complexity of medical condition, skilled PT is appropriate for guidance and adjustment to program as current symptoms change.  Pt was challenged with thoracic extension with cat cow so modified to perform in chair with ball behind her back.  Patient will benefit from skilled PT to address the below impairments and improve overall function.   OBJECTIVE IMPAIRMENTS decreased activity tolerance, decreased endurance, difficulty walking, decreased strength, increased muscle spasms, impaired flexibility, improper body mechanics, postural dysfunction, and pain.   ACTIVITY LIMITATIONS carrying, lifting, sitting, standing, reach  over head, hygiene/grooming, and locomotion level  PARTICIPATION LIMITATIONS: meal prep, cleaning, laundry, driving, community activity, and yard work  PERSONAL FACTORS Past/current experiences, Time since onset of injury/illness/exacerbation, and 3+ comorbidities: fibromyalgia depression, anxiety,   are also affecting patient's functional outcome.   REHAB POTENTIAL: Good  CLINICAL DECISION MAKING: Evolving/moderate complexity  EVALUATION COMPLEXITY: Moderate   GOALS: Goals reviewed with patient? Yes  SHORT TERM GOALS: Target date: 03/20/22  Return to regular performance of HEP 2-3x/day to improve mobility  Baseline: stretching 2x/day (02/28/22) Goal status: In progress  2.  Report > or = to 25% reduction in cervical and lumbar pain with ADLs and self-care  Baseline: 20% better overall (02/28/2022) Goal status: in progress   3.  Reduce fibromyalgia impact score to < or = to 39  Baseline: 28 (02/28/22) Goal status: MET    LONG TERM GOALS: Target date: 11/22/22  Be independent in advanced HEP Baseline: recent flare up of fibromyalgia so not as consistent recently (10/19/22) Goal status: In progress   2.  Reduce fibromyalgia impact questionnaire to < or = to 21 Baseline: 28 (02/28/22) Goal status: Revised   3.  Lift and carry laundry and her grandchildren with > or = to 70% reduction in pain Baseline: shoulder and neck and low back 50% (10/19/22) Goal status: In progress   4.  Report > or = to 60% reduction in neck and lumbar pain with ADLs and self-care  Baseline: 50% (08/07/22) Goal status: revised   5.  Return to yardwork verbalize appropriate rest breaks and mechanics  Baseline: has worked in garden intermittently and is taking breaks (10/19/22) Goal status: In progress    6. Report > or = to 30% reduction in Rt jaw pain with chewing and talking   Baseline  jaw has been tight but no longer painful (10/19/22)  Goal Status: MET PLAN: PT FREQUENCY: 2x/week  PT DURATION: 8  weeks  PLANNED INTERVENTIONS: Therapeutic exercises, Therapeutic activity, Neuromuscular re-education, Balance training, Gait training, Patient/Family education, Self Care, Joint mobilization, Aquatic Therapy, Dry Needling, Spinal manipulation, Spinal mobilization, Cryotherapy, Moist heat, Taping, Traction, Manual therapy, and Re-evaluation  PLAN FOR NEXT SESSION: continue aquatics, manual therapy to address pain, strength progression  Lorrene Reid, PT 10/24/22 12:36 PM

## 2022-10-25 ENCOUNTER — Ambulatory Visit: Payer: Medicare Other | Admitting: Physical Therapy

## 2022-10-25 ENCOUNTER — Other Ambulatory Visit (HOSPITAL_BASED_OUTPATIENT_CLINIC_OR_DEPARTMENT_OTHER): Payer: Self-pay

## 2022-10-25 ENCOUNTER — Encounter: Payer: Self-pay | Admitting: Psychiatry

## 2022-10-25 ENCOUNTER — Encounter: Payer: Self-pay | Admitting: Physical Therapy

## 2022-10-25 ENCOUNTER — Ambulatory Visit (INDEPENDENT_AMBULATORY_CARE_PROVIDER_SITE_OTHER): Payer: Medicare Other | Admitting: Psychiatry

## 2022-10-25 DIAGNOSIS — F411 Generalized anxiety disorder: Secondary | ICD-10-CM | POA: Diagnosis not present

## 2022-10-25 DIAGNOSIS — M353 Polymyalgia rheumatica: Secondary | ICD-10-CM | POA: Diagnosis not present

## 2022-10-25 DIAGNOSIS — F5105 Insomnia due to other mental disorder: Secondary | ICD-10-CM | POA: Diagnosis not present

## 2022-10-25 DIAGNOSIS — M797 Fibromyalgia: Secondary | ICD-10-CM | POA: Diagnosis not present

## 2022-10-25 DIAGNOSIS — F902 Attention-deficit hyperactivity disorder, combined type: Secondary | ICD-10-CM

## 2022-10-25 DIAGNOSIS — M542 Cervicalgia: Secondary | ICD-10-CM | POA: Diagnosis not present

## 2022-10-25 DIAGNOSIS — F331 Major depressive disorder, recurrent, moderate: Secondary | ICD-10-CM

## 2022-10-25 DIAGNOSIS — M6281 Muscle weakness (generalized): Secondary | ICD-10-CM

## 2022-10-25 DIAGNOSIS — F4310 Post-traumatic stress disorder, unspecified: Secondary | ICD-10-CM

## 2022-10-25 DIAGNOSIS — R7989 Other specified abnormal findings of blood chemistry: Secondary | ICD-10-CM | POA: Diagnosis not present

## 2022-10-25 DIAGNOSIS — R252 Cramp and spasm: Secondary | ICD-10-CM

## 2022-10-25 DIAGNOSIS — R293 Abnormal posture: Secondary | ICD-10-CM

## 2022-10-25 DIAGNOSIS — F5081 Binge eating disorder: Secondary | ICD-10-CM | POA: Diagnosis not present

## 2022-10-25 DIAGNOSIS — F4001 Agoraphobia with panic disorder: Secondary | ICD-10-CM | POA: Diagnosis not present

## 2022-10-25 DIAGNOSIS — F428 Other obsessive-compulsive disorder: Secondary | ICD-10-CM | POA: Diagnosis not present

## 2022-10-25 MED ORDER — LISDEXAMFETAMINE DIMESYLATE 50 MG PO CAPS: 50.0000 mg | ORAL_CAPSULE | Freq: Every day | ORAL | 0 refills | Status: DC

## 2022-10-25 MED ORDER — L-METHYLFOLATE-B6-B12 3-35-2 MG PO TABS: 1.0000 | ORAL_TABLET | Freq: Every day | ORAL | 2 refills | Status: DC

## 2022-10-25 NOTE — Therapy (Unsigned)
OUTPATIENT PHYSICAL THERAPY TREATMENT   Patient Name: Danielle Bruce MRN: [id number] DOB:1960/02/28, 62 y.o., female Today's Date: 10/25/2022  PT End of Session - 10/25/22 1201     Visit Number 60    Date for PT Re-Evaluation 11/22/22    Authorization Type Medicare B- KX now    Progress Note Due on Visit 68    PT Start Time 1200   15 min late   PT Stop Time 1234    PT Time Calculation (min) 34 min    Activity Tolerance Patient tolerated treatment well    Behavior During Therapy [facility] for tasks assessed/performed                  PT End of Session - 10/25/22 1201     Visit Number 60    Date for PT Re-Evaluation 11/22/22    Authorization Type Medicare B- KX now    Progress Note Due on Visit 68    PT Start Time 1200   15 min late   PT Stop Time 1234    PT Time Calculation (min) 34 min    Activity Tolerance Patient tolerated treatment well    Behavior During Therapy [facility] for tasks assessed/performed                                                             Past Medical History:  Diagnosis Date   Abdominal pain, unspecified site 10/18/2012   Anemia    Anxiety    Arthritis    osteoarthritis   ASCUS (atypical squamous cells of undetermined significance) on Pap smear 05/06/2005   NEG HIGH RISK HPV--C&B BIOPSY BENIGN 12/2005   Asthma    Bipolar 2 disorder (HCC)    Cancer (HCC)    skin cancer - basal cell   Colon polyps    Complication of anesthesia    anxious afterwards, will get headaches    Constipation    Depression    Fibromyalgia 10/2013   GERD (gastroesophageal reflux disease)    Hearing loss on left    Heart murmur    never had any problems   Hemorrhoids    High cholesterol    High risk HPV infection 08/2011   cytology negative   IBS (irritable bowel syndrome)    Insomnia    Lymphocytic colitis    Lymphoma (HCC)    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   Bone marrow biopsy  showes 8% plasma cells IgA Lambda   Migraines    Nausea alone 10/18/2012   Osteoarthritis    Osteopenia    Peripheral neuropathy    PTSD (post-traumatic stress disorder)    Past Surgical History:  Procedure Laterality Date   BONE MARROW BIOPSY Left 12/18/2013   Plasma cell dyscrasia 8% population of plasma cells   BREAST BIOPSY Right    benign stereo   CESAREAN SECTION  42,59,56   CHOLECYSTECTOMY N/A 10/16/2012   Procedure: LAPAROSCOPIC CHOLECYSTECTOMY;  Surgeon: Clovis Pu. Cornett, MD;  Location: [facility];  Service: General;  Laterality: N/A;   COLONOSCOPY     numerous times   DILATION AND CURETTAGE OF UTERUS     ESOPHAGOGASTRODUODENOSCOPY     HEMORRHOID SURGERY  1993   x3   IUD REMOVAL  02/2015   Mirena  LAPAROSCOPIC LYSIS OF ADHESIONS N/A 10/16/2012   Procedure: LAPAROSCOPIC LYSIS OF ADHESIONS;  Surgeon: Clovis Pu. Cornett, MD;  Location: [facility];  Service: General;  Laterality: N/A;   LAPAROSCOPY N/A 10/16/2012   Procedure: LAPAROSCOPY DIAGNOSTIC;  Surgeon: Clovis Pu. Cornett, MD;  Location: [facility];  Service: General;  Laterality: N/A;   PELVIC LAPAROSCOPY     RADIOLOGY WITH ANESTHESIA N/A 12/16/2015   Procedure: MRI OF BRAIN WITH AND WITHOUT CONTRAST;  Surgeon: Medication Radiologist, MD;  Location: [facility];  Service: Radiology;  Laterality: N/A;   SHOULDER SURGERY  2007/2008   SPINE SURGERY  2010   cervical   Patient Active Problem List   Diagnosis Date Noted   GAD (generalized anxiety disorder) 12/26/2017   OCD (obsessive compulsive disorder) 12/26/2017   PTSD (post-traumatic stress disorder) 12/26/2017   DDD (degenerative disc disease), cervical 07/14/2016   Primary osteoarthritis of both feet 07/14/2016   Primary osteoarthritis of both hands 07/14/2016   Other fatigue 07/14/2016   History of IBS 07/14/2016   Osteopenia of multiple sites 07/14/2016   Fibromyalgia 01/13/2016   MGUS (monoclonal gammopathy of unknown significance) 12/23/2013   Chronic cholecystitis without  calculus 10/18/2012   Abdominal pain, unspecified site 10/18/2012   Nausea alone 10/18/2012   Unspecified constipation 10/18/2012   Depression 10/18/2012   Anxiety    ASCUS (atypical squamous cells of undetermined significance) on Pap smear    IUD    Hemorrhoids 11/29/2010   Abdominal pain, left upper quadrant 11/29/2010    PCP:  Lupita Raider, MD  REFERRING PROVIDER: Lupita Raider, MD  REFERRING DIAG: fibromyalgia, TMJ dysfunction (added 04/20/22)  THERAPY DIAG:  Abnormal posture  Muscle weakness (generalized)  Cramp and spasm  Cervicalgia  Polymyalgia rheumatica (HCC)  Rationale for Evaluation and Treatment Rehabilitation  ONSET DATE: chronic pain (fibromyalgia) with flare-up 2 months ago  SUBJECTIVE:                                                                                                                                                                                                         SUBJECTIVE STATEMENT: Not too bad today except I took a little stumble before coming and hurt my toe.   PERTINENT HISTORY:  Anxiety, bipolar disorder, depression, fibromyalgia, IBS, migraines, osteopenia, PTSD  PAIN:  Are you having pain: yes Pain location: none really   Pain description: irritating  Aggravating factors: doing too much Relieving factors: muscle relaxers, rest, stretching  PRECAUTIONS: Other: chronic pain syndrome and depression Other: slow progression with exercise due to chronic condition  WEIGHT BEARING  RESTRICTIONS No  FALLS:  Has patient fallen in last 6 months? No  LIVING ENVIRONMENT: Lives with: lives with their family Lives in: House/apartment  OCCUPATION: on disability  PLOF: Independent with basic ADLs and Leisure: yardwork, walking  Pt cares for her 4 young grandchildren- difficulty with care including lifting and carrying  PATIENT GOALS be more active with less pain, exercise at the gym regularly, lift and carry grandchildren  and laundry, sleep with fewer interruptions, get back to more gardening.  OBJECTIVE:   DIAGNOSTIC FINDINGS: none recent   PATIENT SURVEYS: Eval: Fibromyalgia Impact Questionnaire: first 2 sections only: 49 12/26/21: 40  02/28/22: 28 (first 2 sections only)  COGNITION: Overall cognitive status: Within functional limits for tasks assessed  SENSATION: WFL  POSTURE: rounded shoulders and forward head  PALPATION: Diffuse palpable tenderness over bil neck, upper traps, thoracic, lumbar and gluteals with trigger points.    CERVICAL ROM:   Active ROM A/PROM (deg) eval A/ROM 12/26/21 A/ROM 04/20/22 A/ROM 08/07/22  Flexion 55   60  Extension 40     Right lateral flexion 30 35 40 41  Left lateral flexion 35 40 45 50  Right rotation 50 60 65 70  Left rotation 40 65 65 75   (Blank rows = not tested) TMJ: opening limited by 25%, clicking with opening on the Rt.  Palpable tenderness over Rt masseter and at TMJ UPPER EXTREMITY ROM: UE A/ROM is limited by 20% into flexion and abduction with pain at end range.  Hip flexibility is limited by 25% in all directions with pain in all directions.  UPPER EXTREMITY MMT: UE: 4+/5, LE 4+/5 except hip flexors 4-/5  TODAY'S TREATMENT:   10/25/22:Pt arrives for aquatic physical therapy. Treatment took place in 3.5-5.5 feet of water. Water temperature was 91 degrees F. Pt entered the pool via stairs reciprocally with mild use of rails. Pt requires buoyancy of water for support and to offload joints with strengthening exercises. Pt utilizes viscosity of the water required for strengthening. Seated water bench with 75% submersion Pt performed seated LE AROM exercises 20x in all planes,   Standing in 50%- 75% depth water pt performed water walking in all 4 directions 10x with ankle fins added back today. VC for only work at level she felt appropriate. UE single buoy wts used for push/pull with UE and ankle fins. 50% depth standing: knee ext Bil 15x with  small noodle, hip 3 ways with ankle fins 15x required some UE for balance today, Underwater bicycle 5 min with noodle in horseback followed by 3 min  decompression float.     10/24/22: NuStep: Level 3 x 10 minutes for endurance and mobility-PT present to discuss progress. Ball roll outs forward and lateral x10 each Seated on ball: 3 way raises with 2# weights 2x10  Cat cow, challenged with thoracic extension so added seated thoracic extension with ball in chair Sit to stand 5# kettle bell 2x10 Sit to stand with upright row: 5# kettle bell Sit to stand with chest press: kettle bell 2x10  10/19/22: NuStep: Level 3 x 10 minutes for endurance and mobility-PT present to discuss progress. Ball roll outs forward and lateral x10 each Seated on ball: 3 way raises with 2# weights 2x10  Sit to stand 5# kettle bell 2x10 Sit to stand with upright row: 5# kettle bell Sit to stand with chest press: kettle bell 2x10    HOME EXERCISE PROGRAM: Access Code: [email or web address] Access Code: [email or web address] URL: [email or web address] Date: 09/06/2022 Prepared by: Tresa Endo                -  Standing Hip Abduction with Counter Support  - 1 x daily - 7 x weekly - 1-2 sets - 10 reps - Standing Hip Extension with Chair  - 1 x daily - 7 x weekly - 1-2 sets - 10 reps ASSESSMENT:  CLINICAL IMPRESSION: Pt is nearing the end of her aquatics sessions and reports she is probably not going to continue with a membership due to financial reasons. [facility] pool is also too cold.   OBJECTIVE IMPAIRMENTS decreased activity tolerance, decreased endurance, difficulty walking, decreased strength, increased muscle spasms, impaired flexibility, improper body mechanics, postural dysfunction, and pain.   ACTIVITY LIMITATIONS carrying, lifting, sitting, standing, reach over head, hygiene/grooming, and locomotion level  PARTICIPATION LIMITATIONS: meal prep, cleaning, laundry, driving, community activity, and yard work  PERSONAL  FACTORS Past/current experiences, Time since onset of injury/illness/exacerbation, and 3+ comorbidities: fibromyalgia depression, anxiety,   are also affecting patient's functional outcome.   REHAB POTENTIAL: Good  CLINICAL DECISION MAKING: Evolving/moderate complexity  EVALUATION COMPLEXITY: Moderate   GOALS: Goals reviewed with patient? Yes  SHORT TERM GOALS: Target date: 03/20/22  Return to regular performance of HEP 2-3x/day to improve mobility  Baseline: stretching 2x/day (02/28/22) Goal status: In progress  2.  Report > or = to 25% reduction in cervical and lumbar pain with ADLs and self-care  Baseline: 20% better overall (02/28/2022) Goal status: in progress   3.  Reduce fibromyalgia impact score to < or = to 39  Baseline: 28 (02/28/22) Goal status: MET    LONG TERM GOALS: Target date: 11/22/22  Be independent in advanced HEP Baseline: recent flare up of fibromyalgia so not as consistent recently (10/19/22) Goal status: In progress   2.  Reduce fibromyalgia impact questionnaire to < or = to 21 Baseline: 28 (02/28/22) Goal status: Revised   3.  Lift and carry laundry and her grandchildren with > or = to 70% reduction in pain Baseline: shoulder and neck and low back 50% (10/19/22) Goal status: In progress   4.  Report > or = to 60% reduction in neck and lumbar pain with ADLs and self-care  Baseline: 50% (08/07/22) Goal status: revised   5.  Return to yardwork verbalize appropriate rest breaks and mechanics  Baseline: has worked in garden intermittently and is taking breaks (10/19/22) Goal status: In progress    6. Report > or = to 30% reduction in Rt jaw pain with chewing and talking   Baseline  jaw has been tight but no longer painful (10/19/22)  Goal Status: MET PLAN: PT FREQUENCY: 2x/week  PT DURATION: 8 weeks  PLANNED INTERVENTIONS: Therapeutic exercises, Therapeutic activity, Neuromuscular re-education, Balance training, Gait training, Patient/Family education,  Self Care, Joint mobilization, Aquatic Therapy, Dry Needling, Spinal manipulation, Spinal mobilization, Cryotherapy, Moist heat, Taping, Traction, Manual therapy, and Re-evaluation  PLAN FOR NEXT SESSION:  Pt has 3 more aquatic sessions and will finish her aquatics at that time.   Ane Payment, PTA 10/25/22 12:36 PM

## 2022-10-25 NOTE — Progress Notes (Signed)
Danielle Bruce [id number] 1960/06/11 62 y.o.   Subjective:   Patient ID:  Danielle Bruce is a 62 y.o. (DOB [birth year]) female.  Chief Complaint:  Chief Complaint  Patient presents with   Follow-up    Mood and anxiety and meds    HPI [name] presents for follow-up of multiple dxes.  visit in April and taken off lamotrigine DT possible skin reaction.  Had appt with [facility] dermatology and they assume that skin reaction with itching was related to lamotrigine. 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 Per oncology most likely drug is lamotrigine. And she weaned off of the medication to determine if the cutaneous lesions are related. Lesions seem less without lamotrigine.  At this point skin reaction is presumed related to lamotrigine.  No other disease sx. She is well aware that she has been on lamotrigine for many years with good results for depression control. There is a risk of relapse as she comes off of the medication and she is worried about that. Smart phone helps her cognitive  And other productivity.    seen October 2020.  Restarted low dosage ADD bc concerns over STM, concentration and productivity.  Ritalin 5 mg twice daily.  Also suggested she retry N-acetylcysteine for mild cognitive complaints.   seen April 07, 2019 the following was noted: Mostly good.  Anxiety is mostly better, but depression is a little worse seasonally.  Not able to go help her daughter.   Low energy but does have motivation.  Ritalin helps energy and focus but doesn't take it regularly bc irregular sleep cycle.   Ativan only when having medical issues.    Therapy working on trauma issues is very hard.  Struggling with the fact she doesn't have memory of large parts of her past.   No manic sx off mood stabilizers.  Covid isolation reduce stressors overall and not  prominently depressed.   No meds were changed.  June 25, 2019 appointment the following is noted: F real worried about Covid despite vaccination.   Started fluvoxamine 25 on May 14, 2019 from PCP mainly for inflammation but possibly thinking she might be depressed. Seen benefit for energy and sleep pattern and more appropriate feelings and less numb.  Now realizes she had been depressed for awhile but just numb with depression.  Anniversaries of deaths of friends including last BF [name] a little over a year ago.   Thinks of him daily. His 60 yo D died of unknown cause recently.  Stress D moved to beach. Wonders about increasing the fluvoxamine  [name] living in [place] and hasn't seen him in a year. [name] on disability for depression. Patient reports more depression.  Plan: Realized lately she's depressed and started Luvox again for it and for the anti-inflammatory potential for her health.  Has seen some benefit.   Prior benefit at 25 mg daily.  Optiion increase but slightly bc med sensitive. AGREE increase fluvoxamine to 37.5 mg daily.  08/06/2019 appointment with the following noted: Increased fluvoxamine to 37.5 mg and it helped the depression. Could see a big difference and had been more depressed than she even realized. Outlook better and sleep pattern normalized.    More energy and motivation.   But also started having prolonged HA.  HA lasted a couple of weeks.   Assumed HA was DT increased fluvoxamine so reduced to 25 mg on 07/28/19.  07/29/19 Episode of confusion occurred also.  Went to doctor while confused.  They couldn't determine cause.    Resolved in 1 day. FM flare up since fall/winter. To beach with daughter 3 mos minimum to give her shots for IVF treatment. Patient reports panic recently and recent difficulty with anxiety.    Occ flashbacks.   Patient denies difficulty with sleep initiation or maintenance. Denies appetite disturbance.  Patient reports that energy and motivation  have been good.  Patient denies any difficulty with concentration.  Patient denies any suicidal ideation. Plan: AGREE increase fluvoxamine to 37.5 mg daily once HA is controlled for several weekcs.  01/01/20 appt with the following noted: Increased fluvoxamine for awhile but got more HA and reduced it. Anxiety is better in general now.  But is interested in increasing it DT coming winter. When anxiety is not good it catches her off guard. Likely had Covid in August but early test was negative.  Got prednisone but sick for 5 weeks.  Lost taste and smell still going on.  D also had Covid.  Felt really sick and terrified with bad mental health at the time.  Was traumatic for her.  Had been vaccinated.   Had panic attack at the ER and took a long time to get the Ativan.  D moving back to area and pt will have to help with childcare so hopes to more aggressively treat her health problems. 2 gkids and 2 more coming. [name] married without kids yet. Plan: started Luvox again for it and for the anti-inflammatory potential for her health.  Has seen some benefit.   Prior benefit at 25 mg daily.  Optiion increase but slightly bc med sensitive. AGREE increase fluvoxamine to 37.5 mg daily once HA is controlled for several weeks.  03/03/2020 appointment with the following noted: On Day 10 of Covid.  2nd bout. A lot easier this time. Had gotten Luvox up to 50 mg daily and tolerated.  Then doubled it to 100 mg daily. Wonders about the proper dose for maintenance.   No taste and smell, exhausted, congestion and mild cough.  Energy is better than it was in the beginning.  Made her depressed and anxious again and that part is better also.  Last time had to go to ER with Covid.  Only ER visit in 2-3 years.  Less migraine than in the past. Overall was doing better with last couple months.  Trying to do anti-inflammatory diet.  Vegetarian, no dairy or gluten.  Natural sugars.   Still some anxiety. D moved back here and  pregnant with 2nd set of twins. Current twins are 60 yo.   Will restart trauma therapy and it's helped over the last couple of years.  More freedom from things.      Less daily dread with anxiety but still some panic which can be triggered.  Less OCD with fluvoxamine unless triggered.   Taking Ritalin 10 mg each AM and tolerating and benefiting. occ NM with few dreams overall. Plan: started Luvox again for it and for the anti-inflammatory potential for her health.  Has seen some benefit.   Prior benefit at 25 mg daily.  Med sensitive. AGREE increase fluvoxamine to  50  mg daily. Up to 100 mg daily if desired.   04/22/19 appt with following noted: Mostly good and randomly psycho.  Really busy with Judeth Cornfield [redacted] weeks pregnant with twins and twins 60 yo at home.  She's helping and her H is away. Mo  older and slower.  Pt overworking.  When with Judeth Cornfield kids interfere with sleep.  Doesn't eat as well with Judeth Cornfield affects her FM and energy.   Recently exhausted and needed rest but couldn't go home DT D had to go to hospital.  Her H critical at times and she lost it emotionally over this. Almost suicidal reaction with weird intrusive thoughts in response to the criticism.   Got a break and it helped.   Rheum adjusted Lyrica to BID. Had accident fall with one of babies and was bleeding.  She became hysterical.   Upset now that she couldn't control her emotions at the time.  Couldn't get herself under control to deal with the crisis.    Overall doing ok but doesn't handle stressors well and lose it.   Plan: AGREE with increase fluvoxamine to  50  mg daily. Up to 100 mg daily if desired.  06/23/2020 appointment with the following noted: Increased fluvoxamine 25 AM and 50 mg PM and tolerated it.  Been on this dose awhile.  Going to [place] May 6-19 and son taking her.  Working for [employer] and been successful. Also going to [place], [place].  Excited.   Occ overwhelmed. Starting therapy with Dorathy Daft and  looking forward to it and dealing with trauma stuff. Nervous about doing the therapy and getting decompensated like what happened last time. Prednisone hellps mood and body. Not as depressed as in the past.  Sleep is reasonably good.  Appetite is normal.  Anxiety intermittent.  No suicidal thoughts Plan: No med changes  08/19/2020 appointment with the following noted: Not good.  Dog died 2 week ago and was very close to her for 16 years.  She had been a great comfort during losses and health problems.  Dog helped her through periods of SI. Exhausted. [name] in [place] and going in early July. Wonders how long this will last.  Also increased fluvoxamine and now out and having withdrawal. Disc difference between grief and depression. Chrissie Noa needs therapist who takes Medicaid. Plan: no med changes  01/10/21 appt noted: Had covid 3rd time. Still on fluvox 50, Ritalin 10 mg daily, Deplin, trazodone 50 mg HS. Helping with gkids and been sick a lot since September. Been doing so so overall.  At times is good. Son-in-law will be getting raise and D can quit work for a year or so. [name] and his wife in [place] and that's good. Parents getting old. Both parents hearing problems and M more forgetful. Parents personality different.  Less therapy lately bc schedules.  Has dealt with a lot of trauma and will resume after new year and feels it has been helpful.  04/13/21 appt noted: More problems with appetite increase for a couple of years.  Always hungry.  Wonders about changing stimulant for this.  Weight up too much. Needs to lose 15+ # DT chronic pain including hips and knees. More depression.  Seasonally.  Episodic panic and anxiety chronic. Some intermittent conflict with daughter.   Needs Ativan intermittently. Still working through trauma issues. Continues therapy.   Haven't gotten back into church.  Not wanting to go out much. Plan: Benefit  Ritalin 10 mg AM and noon for cognitive and ADD  reasons and disc risk of palpitations.  Per request ok trial Vyvanse for binge eating and ADD instead of Ritalin per PCP suggestion May also help mood and weight loss should help pain. Caution it could cause anxiety  06/06/2021 appointment with the following noted: Did  get a prescription for Vyvanse 30 mg on 04/28/2021 filled.  Tried that in place of Ritalin. Better energy and focus with Vyvanse and less binge eating and lost 7-8 #.  Helped lose prednisone weight from November. Seems to wear off in 5-6 hours.   In afternoon when it wears off just wants to sit around.  No sig SE. Thinks she is still a little depressed and not enjoying things like she should. Chronic colitis with recent flare.   To [place] in May for a couple of weeks with son and his wife. Plan: Per request ok trial Vyvanse for binge eating and ADD and increase  to 40 mg daily per PCP suggestion  08/11/2021 appointment with the following noted: Tolerating increase Vyvanse 40 AM.  No SE.   Less crash with it and longer duration. Lost 14#.  Less craving.   Still kind of depressed.  B moved back after divorce.  F mad about it.  M health problems. Home more stressful.   Went to [place]. M acting odd and F cognitive problems. Poor sleep at home bc temp is so warm for parents. OC sx a little worse but not severe. Plan: Better with Vyvanse for binge eating and ADD and increase  to 40 mg daily per PCP suggestion May also help mood and weight loss should help pain. Continue fluvoxamine 50 mg twice daily  11/16/2021 appointment noted: Helping with gkids. Son in Social worker still working out of state.  F not doing well with cognition and falls. 62yo. Handling stressors pretty well.  When irritable then takes a break. Enjoys grandkids 4 of them. D therapist and not working right now other than taking care of kids. Restarted PT which has helped in the past with pain and limitations physically. Starting therapy with Merry Lofty end of  October. sTill taking vyvanse 40 and fluvoxamine 50 mg BID. Has missed some Vyvanse bc some days sleeping late. More productive with Vyvanse 40 mg AM, but still procrastinates. Can read again better and sustain attention better but not as productive as she would like. Still a fair amount of anxiety easily triggered anxiety at times and hopes therapy will help.  Claustrophobic easily with panic triggered. Depression is OK lately.  02/16/22 appt noted: Had asthma episode and had to leave.  She was not in severe distress and was able to verbalize that she could drive herself home.  04/26/22 appt noted: Doing well.  Overall.  If gets up late then skips vyvanse and then is sleepy.   Back and forth between [facility] and her house where temp is high for mother.  B at her house and always cold.  No HA with Stephanie's.   F 06-Apr-2022 died with dementia.  Declined quickly.  Didn't have to be placed.  Has been ok.  Gkids talk about him.  M doing pretty well and grateful he passed before being placed.   Eating healthy.   Asks whether needs to continue fluvoxamine.   Still on fluvoxamine 50 BID, Vyvanse 40 AM, trazdone 50 MG Hs Mood is good. Counseling with Bambi Cottle is already helpful. Still some anxiety but getting better recognizing triggers.  Can catastrophixize and working on CBT.  Still episodes out of the blue. Energy good with Vyvanse. Sleep is ok at D's. Plan no med changes:  07/27/2022 appointment noted: Psych meds fluvoxamine 50 mg twice daily, Vyvanse 50 mg every morning, L-methylfolate 15 mg daily, lorazepam 1 mg as needed panic, Lyrica 50 mg twice daily.  Trazodone 50 mg nightly as needed insomnia. No SE.   Vyvanse helps focus, binge eating .  Was out 5 days and overate.  More productive with it.  Better socialization with it.  Lost wt on it. Dose seems right . Sick with URI. Otherwise doing well.   D moved back [place] .  Had "breakdown" with conflict with D with thoughts of  wanting to escape but came out of it.  Therapist Bambi Cottle helped her.  Less often panic but has them at times.  Therapist helping her to recognize triggers.  Dep is under control.  Not desperate. Will have flareup with FM.  But has been active with gkids. When with kids sleeps well.   Plan no changes  10/25/22 appt noted: Meds as above & flexeril 10 mg HS.  Not often with lorazepam.   Pending cataract surgery in fall. Working in therapy to try to resolve issues that affect her body.  It is making her more self aware.  Hasn't started EMDR yet, but working on CBT.   F April 15, 2022 died with dementia. Wants to try methylated B6 and B12 to see if it helps. If rough mental health day then needs Ativan. Doing pool therapy.   Satisfied with Vyvanse 50 AM.  Has to take by 930 AM to keep from having insomnia.  Helps energy and motivation.  Still needs to work on productivity.    Still some migraine.  Migraine's will cause neuro Sx like word finding, visual. OCD sx been worse lately.  Cleanliness concerns in public.  Carol Ada to go to gym which produces anxiety.  Not always as bad.   Overall better on the whole but still bad days at times even with SI not often.   Still prefers to avoid going out, church, friends.  M's favorite child, B, has moved back home.   Creates some stress. Vyvanse solved binge eating generally.  Past psych meds: Lithium, carbamazepine, oxcarbazepine, topiramate, valproic acid, lamotrigine, gabapentin  Zyprexa, Seroquel, Latuda headaches, Geodon,  Risperidone, Stelazine,  Rexulti,  Wellbutrin with side effects,  sertraline irritability, duloxetine,  fluvoxamine 100, fluoxetine, Stopped desipramine DT skin issues which now resolved.  It helped GI px.  buspirone side effects,   N-acetylcysteine,  pramipexole,  methylphenidate,  Adderall,  Vyvanse 50 no SE, helps Ambien with amnesia, trazodone, Ativan  Review of Systems:  Review of Systems  HENT:  Negative for congestion.    Gastrointestinal:  Positive for abdominal pain. Negative for nausea.  Musculoskeletal:  Positive for back pain and myalgias.  Neurological:  Positive for headaches. Negative for tremors.  Psychiatric/Behavioral:  Negative for behavioral problems and dysphoric mood. The patient is nervous/anxious.   Less HA with new meds.  Medications: I have reviewed the patient's current medications.  Current Outpatient Medications  Medication Sig Dispense Refill   albuterol (PROVENTIL HFA;VENTOLIN HFA) 108 (90 Base) MCG/ACT inhaler Inhale 2 puffs into the lungs every 6 (six) hours as needed for wheezing or shortness of breath.     Calcium Carb-Cholecalciferol (CALCIUM 1000 + D) 1000-20 MG-MCG TABS Take by mouth.     cyclobenzaprine (FLEXERIL) 10 MG tablet Take 10 mg by mouth at bedtime as needed.     diphenhydrAMINE (BENADRYL) 25 mg capsule Take 50 mg by mouth every 6 (six) hours as needed (HEADACHES).     Erenumab-aooe 70 MG/ML SOAJ Inject into the skin every 28 (twenty-eight) days.     fluvoxaMINE (LUVOX) 50 MG tablet TAKE 1 TABLET(50 MG) BY MOUTH  TWICE DAILY 180 tablet 1   hydrocortisone (ANUSOL-HC) 25 MG suppository 1 suppository     ibuprofen (ADVIL) 800 MG tablet 1 tablet with food or milk as needed     L-Methylfolate 15 MG TABS TAKE 1 TABLET BY MOUTH DAILY 30 tablet 11   l-methylfolate-B6-B12 (METANX) 3-35-2 MG TABS tablet Take 1 tablet by mouth daily. 30 tablet 2   levocetirizine (XYZAL) 5 MG tablet Take 5 mg by mouth every evening.  5   lisdexamfetamine (VYVANSE) 50 MG capsule Take 1 capsule (50 mg total) by mouth daily. 30 capsule 0   LORazepam (ATIVAN) 1 MG tablet Take 1 tablet (1 mg total) by mouth every 8 (eight) hours as needed for anxiety. (takes 2 mg when having a medical procedure.) 30 tablet 1   methocarbamol (ROBAXIN) 500 MG tablet Take 1 tablet (500 mg total) by mouth daily as needed for muscle spasms. 30 tablet 1   montelukast (SINGULAIR) 10 MG tablet Take 10 mg by mouth at bedtime.      NURTEC 75 MG TBDP Take 1 tablet by mouth daily as needed.     oxyCODONE-acetaminophen (PERCOCET/ROXICET) 5-325 MG per tablet Take 2 tablets by mouth daily as needed for moderate pain or severe pain (mighraine.).      polyethylene glycol (MIRALAX / GLYCOLAX) packet Take 17 g by mouth daily as needed for mild constipation.     pravastatin (PRAVACHOL) 40 MG tablet 1 tablet     pregabalin (LYRICA) 50 MG capsule Take 100 mg by mouth 3 (three) times daily.     PROLIA 60 MG/ML SOSY injection BRING TO THE OFFICE FOR INJECTION ON 02/15/2018 AS DIRECTED ONCE EVERY 6 MONTHS     promethazine (PHENERGAN) 25 MG tablet Take 25 mg by mouth every 6 (six) hours as needed for nausea.     traZODone (DESYREL) 50 MG tablet TAKE 1 TABLET(50 MG) BY MOUTH AT BEDTIME 90 tablet 1   VASCEPA 1 g CAPS TK 2 CS PO BID WC  3   VITAMIN D PO Take by mouth. Take 5000mg  daily     VOLTAREN 1 % GEL Apply 2 g topically 4 (four) times daily as needed (JOINT PAIN).   3   Wheat Dextrin (BENEFIBER PO) 1 Tablespoon     [START ON 11/22/2022] lisdexamfetamine (VYVANSE) 50 MG capsule Take 1 capsule (50 mg total) by mouth daily. 30 capsule 0   [START ON 12/20/2022] lisdexamfetamine (VYVANSE) 50 MG capsule Take 1 capsule (50 mg total) by mouth daily. 30 capsule 0   No current facility-administered medications for this visit.    Medication Side Effects: None  Allergies:  Allergies  Allergen Reactions   Sulfa Antibiotics Nausea And Vomiting    Causes pt to vomit blood Other reaction(s): vomiting blood   Gluten Meal Other (See Comments)    Sensitivity.    Lactose Intolerance (Gi) Nausea And Vomiting   Aspirin     Other reaction(s): colitis   Bromfed Dm [Pseudoeph-Bromphen-Dm]     Other reaction(s): strung out and hallucinating   Chlorpromazine     Other reaction(s): Dark thoughts   Desipramine     Other reaction(s): rash   Gabapentin     Other reaction(s): swelling / fuzzy vision   Hydrocodone     Other reaction(s): causes  hangovers   Lamotrigine     Other reaction(s): rash   Metronidazole     Other reaction(s): vomiting blood   Morphine Sulfate     Other reaction(s): tearful   Nsaids  Other reaction(s): colitis   Flagyl [Metronidazole Hcl] Nausea And Vomiting   Morphine And Codeine Other (See Comments)    Reaction: Depression, emotional    Past Medical History:  Diagnosis Date   Abdominal pain, unspecified site 10/18/2012   Anemia    Anxiety    Arthritis    osteoarthritis   ASCUS (atypical squamous cells of undetermined significance) on Pap smear 05/06/2005   NEG HIGH RISK HPV--C&B BIOPSY BENIGN 12/2005   Asthma    Bipolar 2 disorder (HCC)    Cancer (HCC)    skin cancer - basal cell   Colon polyps    Complication of anesthesia    anxious afterwards, will get headaches    Constipation    Depression    Fibromyalgia 10/2013   GERD (gastroesophageal reflux disease)    Hearing loss on left    Heart murmur    never had any problems   Hemorrhoids    High cholesterol    High risk HPV infection 08/2011   cytology negative   IBS (irritable bowel syndrome)    Insomnia    Lymphocytic colitis    Lymphoma (HCC)    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   Bone marrow biopsy showes 8% plasma cells IgA Lambda   Migraines    Nausea alone 10/18/2012   Osteoarthritis    Osteopenia    Peripheral neuropathy    PTSD (post-traumatic stress disorder)     Family History  Problem Relation Age of Onset   Diabetes Father    Hypertension Father    Hyperlipidemia Father    Depression Daughter    Anxiety disorder Daughter    Asthma Daughter    Heart disease Maternal Grandfather    Heart disease Paternal Grandmother    Heart disease Paternal Grandfather    Depression Son    Depression Son    Anxiety disorder Son    Breast cancer Neg Hx     Social History   Socioeconomic History   Marital status: Divorced    Spouse name: Not on file   Number of children: 3   Years  of education: Not on file   Highest education level: Not on file  Occupational History   Not on file  Tobacco Use   Smoking status: Former    Current packs/day: 0.00    Types: Cigarettes    Quit date: 05/13/2014    Years since quitting: 8.4   Smokeless tobacco: Never   Tobacco comments:    social   Vaping Use   Vaping status: Never Used  Substance and Sexual Activity   Alcohol use: Yes    Alcohol/week: 0.0 standard drinks of alcohol    Comment: rare   Drug use: Never   Sexual activity: Not Currently    Comment: -1st intercourse 62 yo-More than 5 partners  Other Topics Concern   Not on file  Social History Narrative   Not on file   Social Determinants of Health   Financial Resource Strain: Not on file  Food Insecurity: Not on file  Transportation Needs: Not on file  Physical Activity: Not on file  Stress: Not on file  Social Connections: Unknown (05/11/2022)   [facility] from [facility]   Social Network    Social Network: Not on file  Intimate Partner Violence: Unknown (05/11/2022)   [facility] from [facility]   HITS    Physically Hurt: Not on file    Insult or Talk Down To:  Not on file    Threaten Physical Harm: Not on file    Scream or Curse: Not on file    Past Medical History, Surgical history, Social history, and Family history were reviewed and updated as appropriate.   Please see review of systems for further details on the patient's review from today.   Objective:   Physical Exam:  LMP 12/28/2010   Physical Exam Constitutional:      General: She is not in acute distress. Musculoskeletal:        General: No deformity.  Neurological:     Mental Status: She is alert and oriented to person, place, and time.     Coordination: Coordination normal.  Psychiatric:        Attention and Perception: Attention and perception normal. She does not perceive auditory or visual hallucinations.        Mood and Affect: Mood is anxious.  Mood is not depressed. Affect is not labile, angry, tearful or inappropriate.        Speech: Speech normal.        Behavior: Behavior normal.        Thought Content: Thought content normal. Thought content is not paranoid or delusional. Thought content does not include homicidal or suicidal ideation. Thought content does not include suicidal plan.        Cognition and Memory: Cognition and memory normal.        Judgment: Judgment normal.     Comments: Insight intact Depression  better overall Good affect.      Lab Review:     Component Value Date/Time   NA 142 08/01/2016 1713   NA 144 07/12/2015 1521   K 4.7 08/01/2016 1713   K 3.8 07/12/2015 1521   CL 107 08/01/2016 1713   CO2 23 08/01/2016 1713   CO2 27 07/12/2015 1521   GLUCOSE 95 08/01/2016 1713   GLUCOSE 82 07/12/2015 1521   BUN 14 08/01/2016 1713   BUN 10.3 07/12/2015 1521   CREATININE 0.87 08/01/2016 1713   CREATININE 0.8 07/12/2015 1521   CALCIUM 10.0 08/01/2016 1713   CALCIUM 9.9 07/12/2015 1521   PROT 7.4 08/01/2016 1713   PROT 7.4 07/12/2015 1521   ALBUMIN 4.8 08/01/2016 1713   ALBUMIN 4.4 07/12/2015 1521   AST 23 08/01/2016 1713   AST 22 07/12/2015 1521   ALT 26 08/01/2016 1713   ALT 31 07/12/2015 1521   ALKPHOS 55 08/01/2016 1713   ALKPHOS 54 07/12/2015 1521   BILITOT 0.7 08/01/2016 1713   BILITOT 0.40 07/12/2015 1521   GFRNONAA 76 08/01/2016 1713   GFRAA 88 08/01/2016 1713       Component Value Date/Time   WBC 5.9 08/01/2016 1713   RBC 4.71 08/01/2016 1713   HGB 14.6 08/01/2016 1713   HGB 14.1 07/12/2015 1521   HCT 43.1 08/01/2016 1713   HCT 41.5 07/12/2015 1521   PLT 254 08/01/2016 1713   PLT 248 07/12/2015 1521   MCV 91.5 08/01/2016 1713   MCV 92.2 07/12/2015 1521   MCH 31.0 08/01/2016 1713   MCHC 33.9 08/01/2016 1713   RDW 13.2 08/01/2016 1713   RDW 13.3 07/12/2015 1521   LYMPHSABS 2,006 08/01/2016 1713   LYMPHSABS 1.8 07/12/2015 1521   MONOABS 354 08/01/2016 1713   MONOABS 0.7  07/12/2015 1521   EOSABS 118 08/01/2016 1713   EOSABS 0.1 07/12/2015 1521   BASOSABS 0 08/01/2016 1713   BASOSABS 0.0 07/12/2015 1521    No results found for: "POCLITH", "LITHIUM"  No results found for: "PHENYTOIN", "PHENOBARB", "VALPROATE", "CBMZ"   Genesight:  2D6 poor,  SLC6A4 short/short MTHFR homozygous   .res Assessment: Plan:    Analise "Elon Jester" was seen today for follow-up.  Diagnoses and all orders for this visit:  PTSD (post-traumatic stress disorder)  Major depressive disorder, recurrent episode, moderate (HCC)  Obsessional thoughts  Generalized anxiety disorder  Attention deficit hyperactivity disorder (ADHD), combined type -     lisdexamfetamine (VYVANSE) 50 MG capsule; Take 1 capsule (50 mg total) by mouth daily. -     lisdexamfetamine (VYVANSE) 50 MG capsule; Take 1 capsule (50 mg total) by mouth daily.  Panic disorder with agoraphobia  Insomnia due to mental condition  Low vitamin D level  Fibromyalgia -     l-methylfolate-B6-B12 (METANX) 3-35-2 MG TABS tablet; Take 1 tablet by mouth daily.  Binge-eating disorder, moderate -     lisdexamfetamine (VYVANSE) 50 MG capsule; Take 1 capsule (50 mg total) by mouth daily. -     lisdexamfetamine (VYVANSE) 50 MG capsule; Take 1 capsule (50 mg total) by mouth daily.   Past couple of years more obvious PTSD obvious has called into doubt the bipolar dix bc she's remained mood stable off the lamotrigine.    She has had symptoms consistent with hypomania in the past but there is now some question as to whether they were PTSD related rather than truly bipolar 2.  Her son has had treatment resistant depression with possible bipolar disorder and her daughter has been diagnosed with bipolar disorder but also a history of borderline personality disorder.  So there is a family history of the spectrum.  Has seen some benefit.   Prior benefit fluvoxamine for anxiety and mood disorders.  No SE Continue fluvoxamine 50 mg  BID Clear benefit from fluvoxamine for mood quite significantly. No manic sx. Consider weaning when therapy has helped a little more and further progress.  HA are better 2-4/month.  Still has them at times.    Disc sleep hygiene and temperature.    Counseling: 20 mins: CBT around agoraphobia.  Need to push herself to get out more.  She plans to continue with Bambi Cottle very helpful.  Still grieving father.  Disc grief progress.  Disc psychosomatic issues.     discussed the short-term risks associated with benzodiazepines including sedation and increased fall risk among others.  Discussed long-term side effect risk including dependence, potential withdrawal symptoms, and the potential eventual dose-related risk of dementia.  But recent studies from 2020 dispute this association between benzodiazepines and dementia risk. Newer studies in 2020 do not support an association with dementia.   Keep this at a minimum given cognitive concerns.  She only uses it when triggered with anxiety usually medical.  She is not using it regularly. Continue Ativan prn 1 mg for panic and triggers.  Discussed potential benefits, risks, and side effects of stimulants with patient to include increased heart rate, palpitations, insomnia, increased anxiety, increased irritability, or decreased appetite.  Instructed patient to contact office if experiencing any significant tolerability issues.  Better with Vyvanse for binge eating and ADD at 50 mg daily per PCP suggestion May also help mood and weight loss should help pain.  Has been helpful.   Caution it could cause anxiety and other SE Disc pending generic.  Disc dosing options and risk of skipping higher doses will be more noticeably problematic.  No med changes indicated except agree with trial methylated B6 & B12. Continue: fluvoxamine 50 mg twice  daily, Vyvanse 50 mg every morning, L-methylfolate 15 mg daily, lorazepam 1 mg as needed panic, Lyrica 50 mg twice  daily.  Trazodone 50 mg nightly as needed insomnia.  FU 3 mos  Meredith Staggers, MD, DFAPA    Please see After Visit Summary for patient specific instructions.  Future Appointments  Date Time Provider Department Center  10/31/2022 12:30 PM Edrick Oh, PT [facility] None  10/31/2022  3:00 PM Cottle, Bambi G, LCSW [facility] None  11/01/2022 10:15 AM Ane Payment L, PTA [facility] None  11/01/2022  1:30 PM Ellamae Sia, DO [facility] None  11/07/2022 11:45 AM Edrick Oh, PT [facility] None  11/07/2022  3:00 PM Cottle, Bambi G, LCSW [facility] None  11/08/2022 11:45 AM Ane Payment L, PTA [facility] None  11/14/2022  3:00 PM Cottle, Bambi G, LCSW [facility] None  11/21/2022  3:00 PM Cottle, Bambi G, LCSW [facility] None  11/28/2022  3:00 PM Cottle, Bambi G, LCSW [facility] None  12/05/2022  3:00 PM Cottle, Bambi G, LCSW [facility] None  12/12/2022  3:00 PM Cottle, Bambi G, LCSW [facility] None  12/19/2022  3:00 PM Cottle, Bambi G, LCSW [facility] None  12/26/2022  3:00 PM Cottle, Bambi G, LCSW [facility] None    No orders of the defined types were placed in this encounter.      -------------------------------

## 2022-10-25 NOTE — Progress Notes (Signed)
[facility] Counselor/Therapist Progress Note  Patient ID: Danielle Bruce, MRN: [id number],    Date: 10/24/2022  Time Spent: 60 minutes Time In:  3:00  Time out: 4:00  Treatment Type: Individual Therapy  Reported Symptoms: flashbacks, responding to triggers, depression, anxiety, being busy all of the time, dissociation  Mental Status Exam: Appearance:  Casual     Behavior: Appropriate  Motor: Restlestness  Speech/Language:  Clear and Coherent  Affect: Blunt  Mood: anxious  Thought process: normal  Thought content:   WNL  Sensory/Perceptual disturbances:   WNL  Orientation: oriented to person, place, time/date, and situation  Attention: Good  Concentration: Good  Memory: WNL  Fund of knowledge:  Good  Insight:   Good  Judgment:  Fair  Impulse Control: Good   Risk Assessment: Danger to Self:  No Self-injurious Behavior: No Danger to Others: No Duty to Warn:no Physical Aggression / Violence:No  Access to Firearms a concern: No  Gang Involvement:No   Subjective: The patient attended a face-to-face individual therapy session in the office today.  The patient presents as anxious today.  The patient reports that she is still concerned about where she is going to live if something should happen to her mother.  We processed this a little more and I helped her reframe how she is looking at things because she feels guilty because she is complaining about where she lives.  I explained to her that it is normal to be anxious about where you are going to end up and how things are going to work out and that it is wise for her to think about those things.  We talked about the need for her to have a conversation with her mother about what to expect and her mother has been talking to attorneys recently and it seems that it would be a good time for her to have that conversation with her mother.  We did some role playing so that she would know what words to use.  Interventions:  Cognitive Behavioral Therapy, Mindfulness Meditation, Eye Movement Desensitization and Reprocessing (EMDR), Insight-Oriented, and Interpersonal, psychoeducation  Diagnosis:Attention deficit hyperactivity disorder (ADHD), combined type  PTSD (post-traumatic stress disorder)  Obsessional thoughts  Major depressive disorder, recurrent episode, moderate (HCC)  Plan: Plan of Care: Client Abilities/Strengths  Insightful, motivated, Supportive family Client Treatment Preferences  Outpatient Individual therapy/EMDR  Client Statement of Needs  "I think I have PTSD and I feel like I need another kind of therapy than talk therapy" Treatment Level  Outpatient Individual therapy  Symptoms  Demonstrates an exaggerated startle response.:  (Status: maintained). Depressed  or irritable mood.: (Status:maintained). Describes a reliving of the event,  particularly through dissociative flashbacks.: (Status: maintained). Displays a  significant decline in interest and engagement in activities.:  (Status: maintained). Displays significant psychological and/or physiological distress resulting from internal and external  clues that are reminiscent of the traumatic event.: (Status: maintained).  Experiences disturbances in sleep.:  (Status: maintained). Experiences disturbing  and persistent thoughts, images, and/or perceptions of the traumatic event.:  (Status: maintained). Experiences frequent nightmares.: (Status: maintained).  Feelings of hopelessness, worthlessness, or inappropriate guilt.: No Description Entered (Status:  improved). Has been exposed to a traumatic event involving actual or perceived threat of death or  serious injury.:  (Status: maintained). Impairment in social, occupational, or  other areas of functioning.:(Status: maintained). Intentionally avoids activities,  places, people, or objects (e.g., up-armored vehicles) that evoke memories of the event.: (Status: maintained). Intentionally  avoids  thoughts, feelings, or discussions related  to the traumatic event.: (Status: maintained). Reports difficulty concentrating as  well as feelings of guilt (Status:maintained). Reports response of intense fear,  helplessness, or horror to the traumatic event.:  (Status: maintained).  Problems Addressed  Unipolar Depression, Posttraumatic Stress Disorder (PTSD), Posttraumatic Stress Disorder (PTSD),   Posttraumatic Stress Disorder (PTSD), Posttraumatic Stress Disorder (PTSD)  Goals 1. Develop healthy thinking patterns and beliefs about self, others, and the world that lead to the alleviation and help prevent the relapse of  depression. Objective Identify and replace thoughts and beliefs that support depression. Target Date: 2023/01/04 Frequency: Weekly Progress: 30 Modality: individual Related Interventions 1. Explore and restructure underlying assumptions and beliefs reflected in biased self-talk that  may put the client at risk for relapse or recurrence. 2. Conduct Cognitive-Behavioral Therapy (see Cognitive Behavior Therapy by Reola Calkins; Overcoming Depression by Agapito Games al.), beginning with helping the client learn the connection among  cognition, depressive feelings, and actions. 2. Eliminate or reduce the negative impact trauma related symptoms have  on social, occupational, and family functioning. Objective Learn and implement personal skills to manage challenging situations related to trauma. Target Date: 2023/01/04 Frequency: Weekly Progress: 0 Modality: individual 3. No longer avoids persons, places, activities, and objects that are  reminiscent of the traumatic event. Objective Participate in Eye Movement Desensitization and Reprocessing (EMDR) to reduce emotional distress  related to traumatic thoughts, feelings, and images. Target Date: 2023/01/04 Frequency: Weekly Progress: 0 Modality: individual   Related Interventions 1. Utilize Eye Movement Desensitization and  Reprocessing (EMDR) to reduce the client's  emotional reactivity to the traumatic event and reduce PTSD symptoms. Objective Learn and implement guided self-dialogue to manage thoughts, feelings, and urges brought on by  encounters with trauma-related situations. Target Date: 2024-11/07 Frequency: Weekly Progress: 10 Modality: individual Related Interventions 1. Teach the client a guided self-dialogue procedure in which he/she learns to recognize  maladaptive self-talk, challenges its biases, copes with engendered feelings, overcomes  avoidance, and reinforces his/her accomplishments; review and reinforce progress, problemsolve obstacles. 4. No longer experiences intrusive event recollections, avoidance of event  reminders, intense arousal, or disinterest in activities or  relationships. 5. Thinks about or openly discusses the traumatic event with others  without experiencing psychological or physiological distress. Diagnosis Axis  none F43.10 (Posttraumatic stress disorder) - Open - [Signifier: n/a] Posttraumatic Stress  Disorder  Conditions For Discharge Achievement of treatment goals and objectives   Kielyn Kardell G Antwoine Zorn, LCSW

## 2022-10-26 ENCOUNTER — Encounter: Payer: Self-pay | Admitting: Physical Therapy

## 2022-10-30 ENCOUNTER — Emergency Department (HOSPITAL_BASED_OUTPATIENT_CLINIC_OR_DEPARTMENT_OTHER): Payer: Medicare Other | Admitting: Radiology

## 2022-10-30 ENCOUNTER — Encounter (HOSPITAL_BASED_OUTPATIENT_CLINIC_OR_DEPARTMENT_OTHER): Payer: Self-pay

## 2022-10-30 ENCOUNTER — Emergency Department (HOSPITAL_BASED_OUTPATIENT_CLINIC_OR_DEPARTMENT_OTHER)
Admission: EM | Admit: 2022-10-30 | Discharge: 2022-10-30 | Disposition: A | Discharge status: Home / Self Care | Payer: Medicare Other | Attending: Emergency Medicine | Admitting: Emergency Medicine

## 2022-10-30 ENCOUNTER — Other Ambulatory Visit: Payer: Self-pay

## 2022-10-30 DIAGNOSIS — R079 Chest pain, unspecified: Secondary | ICD-10-CM | POA: Diagnosis present

## 2022-10-30 DIAGNOSIS — K219 Gastro-esophageal reflux disease without esophagitis: Secondary | ICD-10-CM | POA: Insufficient documentation

## 2022-10-30 DIAGNOSIS — Z981 Arthrodesis status: Secondary | ICD-10-CM | POA: Diagnosis not present

## 2022-10-30 DIAGNOSIS — R0789 Other chest pain: Secondary | ICD-10-CM | POA: Diagnosis not present

## 2022-10-30 LAB — CBC
HCT: 41.3 % (ref 36.0–46.0)
Hemoglobin: 14.2 g/dL (ref 12.0–15.0)
MCH: 30.5 pg (ref 26.0–34.0)
MCHC: 34.4 g/dL (ref 30.0–36.0)
MCV: 88.8 fL (ref 80.0–100.0)
Platelets: 247 10*3/uL (ref 150–400)
RBC: 4.65 MIL/uL (ref 3.87–5.11)
RDW: 12 % (ref 11.5–15.5)
WBC: 5.6 10*3/uL (ref 4.0–10.5)
nRBC: 0 % (ref 0.0–0.2)

## 2022-10-30 LAB — BASIC METABOLIC PANEL
Anion gap: 9 (ref 5–15)
BUN: 14 mg/dL (ref 6–20)
CO2: 28 mmol/L (ref 22–32)
Calcium: 10.1 mg/dL (ref 8.9–10.3)
Chloride: 103 mmol/L (ref 98–111)
Creatinine, Ser: 0.71 mg/dL (ref 0.44–1.00)
GFR, Estimated: >60 mL/min (ref >60)
Glucose, Bld: 98 mg/dL (ref 70–99)
Potassium: 3.7 mmol/L (ref 3.5–5.1)
Sodium: 140 mmol/L (ref 135–145)

## 2022-10-30 LAB — TROPONIN I (HIGH SENSITIVITY)
Troponin I (High Sensitivity): 3 ng/L (ref <18)
Troponin I (High Sensitivity): 3 ng/L (ref <18)

## 2022-10-30 MED ORDER — LIDOCAINE VISCOUS HCL 2 % MT SOLN
15.0000 mL | Freq: Once | OROMUCOSAL | Status: AC
  Administered 2022-10-30: 15 mL via ORAL
  Filled 2022-10-30: qty 15

## 2022-10-30 MED ORDER — OXYCODONE-ACETAMINOPHEN 5-325 MG PO TABS
1.0000 | ORAL_TABLET | Freq: Once | ORAL | Status: AC
  Administered 2022-10-30: 1 via ORAL
  Filled 2022-10-30: qty 1

## 2022-10-30 MED ORDER — ALUM & MAG HYDROXIDE-SIMETH 200-200-20 MG/5ML PO SUSP
30.0000 mL | Freq: Once | ORAL | Status: AC
  Administered 2022-10-30: 30 mL via ORAL
  Filled 2022-10-30: qty 30

## 2022-10-30 NOTE — Discharge Instructions (Signed)
You were seen in the ER today for concerns of chest pain. Your labs were reassuring today for no signs of cardiac involvement of your symptoms. Given this pain has happened multiple times, I would advise following up with your primary care doctor and GI group as there is a possibility that this could be esophageal spasms. This cannot be diagnosed from the ER, but given your history of similar symptoms with unremarkable cardiac or pulmonary findings and prolonged history of GERD, this should be looked into a bit more. If you are worried that your symptoms are worsening, please return to the ER. In the meantime, I would advise a short course of a PPI like omeprazole for the next two weeks to determine if this can keep your symptoms under control.

## 2022-10-30 NOTE — ED Provider Notes (Signed)
[place] EMERGENCY DEPARTMENT AT [facility] Provider Note   CSN: [id number] Arrival date & time: 10/30/22  1640     History Chief Complaint  Patient presents with   Chest Pain    Danielle Bruce is a 62 y.o. female. Patient presents to the ED with concerns of chest pain. Reports that this started earlier today around lunch time but had not eaten anything. States history of GERD currently managed with famotidine. Was previously on PPIs for a prolonged history but was stopped after concerns of multi-year use. Denies any SOB, radiating chest pain, nausea, vomiting, sweating, abdominal pain, diarrhea, or headache.   Chest Pain      Home Medications Prior to Admission medications   Medication Sig Start Date End Date Taking? Authorizing Provider  albuterol (PROVENTIL HFA;VENTOLIN HFA) 108 (90 Base) MCG/ACT inhaler Inhale 2 puffs into the lungs every 6 (six) hours as needed for wheezing or shortness of breath.    [provider]  Calcium Carb-Cholecalciferol (CALCIUM 1000 + D) 1000-20 MG-MCG TABS Take by mouth.    [provider]  cyclobenzaprine (FLEXERIL) 10 MG tablet Take 10 mg by mouth at bedtime as needed. 05/13/19   [provider]  diphenhydrAMINE (BENADRYL) 25 mg capsule Take 50 mg by mouth every 6 (six) hours as needed (HEADACHES).    [provider]  Erenumab-aooe 70 MG/ML SOAJ Inject into the skin every 28 (twenty-eight) days.    [provider]  fluvoxaMINE (LUVOX) 50 MG tablet TAKE 1 TABLET(50 MG) BY MOUTH TWICE DAILY 06/01/22   Cottle, Steva Ready., MD  hydrocortisone (ANUSOL-HC) 25 MG suppository 1 suppository 09/21/20   [provider]  ibuprofen (ADVIL) 800 MG tablet 1 tablet with food or milk as needed 06/07/20   [provider]  L-Methylfolate 15 MG TABS TAKE 1 TABLET BY MOUTH DAILY 05/05/22   Cottle, Steva Ready., MD  l-methylfolate-B6-B12 (METANX) 3-35-2 MG TABS tablet Take 1 tablet by mouth daily. 10/25/22    Cottle, Steva Ready., MD  levocetirizine (XYZAL) 5 MG tablet Take 5 mg by mouth every evening. 06/07/15   [provider]  lisdexamfetamine (VYVANSE) 50 MG capsule Take 1 capsule (50 mg total) by mouth daily. 10/24/22   Cottle, Steva Ready., MD  lisdexamfetamine (VYVANSE) 50 MG capsule Take 1 capsule (50 mg total) by mouth daily. 11/22/22   Cottle, Steva Ready., MD  lisdexamfetamine (VYVANSE) 50 MG capsule Take 1 capsule (50 mg total) by mouth daily. 12/20/22   Cottle, Steva Ready., MD  LORazepam (ATIVAN) 1 MG tablet Take 1 tablet (1 mg total) by mouth every 8 (eight) hours as needed for anxiety. (takes 2 mg when having a medical procedure.) 04/26/22   Cottle, Steva Ready., MD  methocarbamol (ROBAXIN) 500 MG tablet Take 1 tablet (500 mg total) by mouth daily as needed for muscle spasms. 04/13/20   Gearldine Bienenstock, PA-C  montelukast (SINGULAIR) 10 MG tablet Take 10 mg by mouth at bedtime.    [provider]  NURTEC 75 MG TBDP Take 1 tablet by mouth daily as needed. 11/09/20   [provider]  oxyCODONE-acetaminophen (PERCOCET/ROXICET) 5-325 MG per tablet Take 2 tablets by mouth daily as needed for moderate pain or severe pain (mighraine.).     [provider]  polyethylene glycol (MIRALAX / GLYCOLAX) packet Take 17 g by mouth daily as needed for mild constipation.    [provider]  pravastatin (PRAVACHOL) 40 MG tablet 1 tablet  [provider]  pregabalin (LYRICA) 50 MG capsule Take 100 mg by mouth 3 (three) times daily. 12/22/19   [provider]  PROLIA 60 MG/ML SOSY injection BRING TO THE OFFICE FOR INJECTION ON 02/15/2018 AS DIRECTED ONCE EVERY 6 MONTHS 01/31/18   [provider]  promethazine (PHENERGAN) 25 MG tablet Take 25 mg by mouth every 6 (six) hours as needed for nausea.    [provider]  traZODone (DESYREL) 50 MG tablet TAKE 1 TABLET(50 MG) BY MOUTH AT BEDTIME 06/01/22   Cottle, Steva Ready., MD  VASCEPA 1 g CAPS TK  2 CS PO BID WC 07/21/16   [provider]  VITAMIN D PO Take by mouth. Take 5000mg  daily    [provider]  VOLTAREN 1 % GEL Apply 2 g topically 4 (four) times daily as needed (JOINT PAIN).  07/06/14   [provider]  Wheat Dextrin (BENEFIBER PO) 1 Tablespoon 06/15/14   [provider]      Allergies    Sulfa antibiotics, Gluten meal, Lactose intolerance (gi), Aspirin, Bromfed dm [pseudoeph-bromphen-dm], Chlorpromazine, Desipramine, Gabapentin, Hydrocodone, Lamotrigine, Metronidazole, Morphine sulfate, Nsaids, Flagyl [metronidazole hcl], and Morphine and codeine    Review of Systems   Review of Systems  Cardiovascular:  Positive for chest pain.  All other systems reviewed and are negative.   Physical Exam Updated Vital Signs BP 137/76   Pulse 73   Temp 97.9 F (36.6 C) (Oral)   Resp 12   Ht 5\' 2"  (1.575 m)   Wt 59.4 kg   LMP 12/28/2010   SpO2 100%   BMI 23.96 kg/m  Physical Exam Vitals and nursing note reviewed.  Constitutional:      General: She is not in acute distress.    Appearance: She is well-developed.  HENT:     Head: Normocephalic and atraumatic.  Eyes:     Conjunctiva/sclera: Conjunctivae normal.  Cardiovascular:     Rate and Rhythm: Normal rate and regular rhythm.     Heart sounds: No murmur heard. Pulmonary:     Effort: Pulmonary effort is normal. No respiratory distress.     Breath sounds: Normal breath sounds.  Abdominal:     General: Bowel sounds are normal.     Palpations: Abdomen is soft.     Tenderness: There is no abdominal tenderness.  Musculoskeletal:        General: No swelling.     Cervical back: Neck supple.  Skin:    General: Skin is warm and dry.     Capillary Refill: Capillary refill takes less than 2 seconds.  Neurological:     Mental Status: She is alert.  Psychiatric:        Mood and Affect: Mood normal.     ED Results / Procedures / Treatments   Labs (all labs ordered are listed, but only  abnormal results are displayed) Labs Reviewed  BASIC METABOLIC PANEL  CBC  TROPONIN I (HIGH SENSITIVITY)  TROPONIN I (HIGH SENSITIVITY)    EKG None  Radiology DG Chest 2 View  Result Date: 10/30/2022 CLINICAL DATA:  Chest pain 2 hours ago. EXAM: CHEST - 2 VIEW COMPARISON:  None Available. FINDINGS: The heart size and mediastinal contours are within normal limits. Both lungs are clear. Cervical spine fusion hardware noted. IMPRESSION: No active cardiopulmonary disease. Electronically Signed   By: Danae Orleans M.D.   On: 10/30/2022 17:48    Procedures Procedures   Medications Ordered in ED Medications  alum &  mag hydroxide-simeth (MAALOX/MYLANTA) 200-200-20 MG/5ML suspension 30 mL (30 mLs Oral Given 10/30/22 2032)    And  lidocaine (XYLOCAINE) 2 % viscous mouth solution 15 mL (15 mLs Oral Given 10/30/22 2032)  oxyCODONE-acetaminophen (PERCOCET/ROXICET) 5-325 MG per tablet 1 tablet (1 tablet Oral Given 10/30/22 2032)    ED Course/ Medical Decision Making/ A&P                               Medical Decision Making Amount and/or Complexity of Data Reviewed Labs: ordered. Radiology: ordered.  Risk OTC drugs. Prescription drug management.   This patient presents to the ED for concern of chest pain. Differential diagnosis includes ACS, MI, PE, GERD, esophageal spasm   Lab Tests:  I Ordered, and personally interpreted labs.  The pertinent results include: CBC unremarkable, BMP unremarkable, troponin negative   Imaging Studies ordered:  I ordered imaging studies including chest x-ray I independently visualized and interpreted imaging which showed no acute cardiopulmonary process I agree with the radiologist interpretation   Medicines ordered and prescription drug management:  I ordered medication including Maalox, viscous lidocaine, Percocet for reflux, pain Reevaluation of the patient after these medicines showed that the patient improved I have reviewed the patients home  medicines and have made adjustments as needed   Problem List / ED Course:  Patient presented to the ED with concerns of chest pain. Has previously had similar episodes with no cardiac abnormality or evidence of PE ever noted. States that PCP is concerned for possible esophageal spasms as she does have a history of GERD managed with PPIs for a long period of time but discontinued several years ago. Now only on Pepcid. Denies classic reflux symptoms, but states that symptoms do at times bother her even with Pepcid. Labs ordered from triage for evaluation of chest pain. No shortness of breath, weakness, dizziness, or fever. Lab workup unremarkable with negative troponin and normal basic labs. Chest xray also unremarkable. EKG reassuring with no evidence of STEMI or arrhythmia. Trial of Maalox with viscous lidocaine attempted along with Percocet. Patient reporting significant improvement in symptoms. High suspicious that symptoms are likely reflux related potentially due to esophageal spasms. Advised patient that following up with PCP and GI as recommended given history and reassuring workup today. Doubt PE as patient is not experiencing shortness of breath, hemoptysis, leg swelling, or recent surgery. Advised strict return precautions. Patient agreeable with treatment plan and verbalized understanding all return precautions. No other acute concerns at this time. Patient discharged home in good condition.  Final Clinical Impression(s) / ED Diagnoses Final diagnoses:  Other chest pain  Gastroesophageal reflux disease, unspecified whether esophagitis present    Rx / DC Orders ED Discharge Orders     None         Salomon Mast 10/31/22 0021    Rondel Baton, MD 10/31/22 1227

## 2022-10-30 NOTE — ED Triage Notes (Addendum)
Pt to ED c/o chest pain x 1.5 hours. Reports was making lunch when this occurred. Took antacid and aspirin at home, reports pain is resolving. Did call EMS while at home, but decided to drive self to this ED instead of EMS transport to [facility]. No s/s of acute distress noted in triage. No SHOB. Reports gardening this morning

## 2022-10-31 ENCOUNTER — Ambulatory Visit (INDEPENDENT_AMBULATORY_CARE_PROVIDER_SITE_OTHER): Payer: Medicare Other | Admitting: Psychology

## 2022-10-31 ENCOUNTER — Ambulatory Visit: Payer: Medicare Other | Attending: Family Medicine

## 2022-10-31 ENCOUNTER — Other Ambulatory Visit: Payer: Self-pay

## 2022-10-31 DIAGNOSIS — F4310 Post-traumatic stress disorder, unspecified: Secondary | ICD-10-CM

## 2022-10-31 DIAGNOSIS — M6281 Muscle weakness (generalized): Secondary | ICD-10-CM | POA: Diagnosis not present

## 2022-10-31 DIAGNOSIS — F428 Other obsessive-compulsive disorder: Secondary | ICD-10-CM

## 2022-10-31 DIAGNOSIS — M542 Cervicalgia: Secondary | ICD-10-CM | POA: Diagnosis not present

## 2022-10-31 DIAGNOSIS — F331 Major depressive disorder, recurrent, moderate: Secondary | ICD-10-CM | POA: Diagnosis not present

## 2022-10-31 DIAGNOSIS — F902 Attention-deficit hyperactivity disorder, combined type: Secondary | ICD-10-CM | POA: Diagnosis not present

## 2022-10-31 DIAGNOSIS — R293 Abnormal posture: Secondary | ICD-10-CM | POA: Diagnosis not present

## 2022-10-31 DIAGNOSIS — R252 Cramp and spasm: Secondary | ICD-10-CM | POA: Diagnosis not present

## 2022-10-31 DIAGNOSIS — M353 Polymyalgia rheumatica: Secondary | ICD-10-CM | POA: Diagnosis not present

## 2022-10-31 NOTE — Therapy (Signed)
OUTPATIENT PHYSICAL THERAPY TREATMENT   Patient Name: Danielle Bruce MRN: [id number] DOB:Aug 18, 1960, 62 y.o., female Today's Date: 10/31/2022  PT End of Session - 10/31/22 1316     Visit Number 61    Date for PT Re-Evaluation 11/22/22    Authorization Type Medicare B- KX now    Progress Note Due on Visit 68    PT Start Time 1234    PT Stop Time 1318    PT Time Calculation (min) 44 min    Activity Tolerance Patient tolerated treatment well    Behavior During Therapy [facility] for tasks assessed/performed                   PT End of Session - 10/31/22 1316     Visit Number 61    Date for PT Re-Evaluation 11/22/22    Authorization Type Medicare B- KX now    Progress Note Due on Visit 68    PT Start Time 1234    PT Stop Time 1318    PT Time Calculation (min) 44 min    Activity Tolerance Patient tolerated treatment well    Behavior During Therapy [facility] for tasks assessed/performed                                                              Past Medical History:  Diagnosis Date   Abdominal pain, unspecified site 10/18/2012   Anemia    Anxiety    Arthritis    osteoarthritis   ASCUS (atypical squamous cells of undetermined significance) on Pap smear 05/06/2005   NEG HIGH RISK HPV--C&B BIOPSY BENIGN 12/2005   Asthma    Bipolar 2 disorder (HCC)    Cancer (HCC)    skin cancer - basal cell   Colon polyps    Complication of anesthesia    anxious afterwards, will get headaches    Constipation    Depression    Fibromyalgia 10/2013   GERD (gastroesophageal reflux disease)    Hearing loss on left    Heart murmur    never had any problems   Hemorrhoids    High cholesterol    High risk HPV infection 08/2011   cytology negative   IBS (irritable bowel syndrome)    Insomnia    Lymphocytic colitis    Lymphoma (HCC)    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   Bone marrow biopsy showes 8% plasma cells  IgA Lambda   Migraines    Nausea alone 10/18/2012   Osteoarthritis    Osteopenia    Peripheral neuropathy    PTSD (post-traumatic stress disorder)    Past Surgical History:  Procedure Laterality Date   BONE MARROW BIOPSY Left 12/18/2013   Plasma cell dyscrasia 8% population of plasma cells   BREAST BIOPSY Right    benign stereo   CESAREAN SECTION  40,98,11   CHOLECYSTECTOMY N/A 10/16/2012   Procedure: LAPAROSCOPIC CHOLECYSTECTOMY;  Surgeon: Clovis Pu. Cornett, MD;  Location: [facility];  Service: General;  Laterality: N/A;   COLONOSCOPY     numerous times   DILATION AND CURETTAGE OF UTERUS     ESOPHAGOGASTRODUODENOSCOPY     HEMORRHOID SURGERY  1993   x3   IUD REMOVAL  02/2015   Mirena   LAPAROSCOPIC LYSIS OF ADHESIONS N/A  10/16/2012   Procedure: LAPAROSCOPIC LYSIS OF ADHESIONS;  Surgeon: Clovis Pu. Cornett, MD;  Location: [facility];  Service: General;  Laterality: N/A;   LAPAROSCOPY N/A 10/16/2012   Procedure: LAPAROSCOPY DIAGNOSTIC;  Surgeon: Clovis Pu. Cornett, MD;  Location: [facility];  Service: General;  Laterality: N/A;   PELVIC LAPAROSCOPY     RADIOLOGY WITH ANESTHESIA N/A 12/16/2015   Procedure: MRI OF BRAIN WITH AND WITHOUT CONTRAST;  Surgeon: Medication Radiologist, MD;  Location: [facility];  Service: Radiology;  Laterality: N/A;   SHOULDER SURGERY  2007/2008   SPINE SURGERY  2010   cervical   Patient Active Problem List   Diagnosis Date Noted   GAD (generalized anxiety disorder) 12/26/2017   OCD (obsessive compulsive disorder) 12/26/2017   PTSD (post-traumatic stress disorder) 12/26/2017   DDD (degenerative disc disease), cervical 07/14/2016   Primary osteoarthritis of both feet 07/14/2016   Primary osteoarthritis of both hands 07/14/2016   Other fatigue 07/14/2016   History of IBS 07/14/2016   Osteopenia of multiple sites 07/14/2016   Fibromyalgia 01/13/2016   MGUS (monoclonal gammopathy of unknown significance) 12/23/2013   Chronic cholecystitis without calculus 10/18/2012    Abdominal pain, unspecified site 10/18/2012   Nausea alone 10/18/2012   Unspecified constipation 10/18/2012   Depression 10/18/2012   Anxiety    ASCUS (atypical squamous cells of undetermined significance) on Pap smear    IUD    Hemorrhoids 11/29/2010   Abdominal pain, left upper quadrant 11/29/2010    PCP:  Lupita Raider, MD  REFERRING PROVIDER: Lupita Raider, MD  REFERRING DIAG: fibromyalgia, TMJ dysfunction (added 04/20/22)  THERAPY DIAG:  Abnormal posture  Muscle weakness (generalized)  Cramp and spasm  Polymyalgia rheumatica (HCC)  Rationale for Evaluation and Treatment Rehabilitation  ONSET DATE: chronic pain (fibromyalgia) with flare-up 2 months ago  SUBJECTIVE:                                                                                                                                                                                                         SUBJECTIVE STATEMENT: I was in the ED yesterday with chest pain.  It was my esophagus in spasm.  I just feel tired and sore today.  My chest is sore.    PERTINENT HISTORY:  Anxiety, bipolar disorder, depression, fibromyalgia, IBS, migraines, osteopenia, PTSD  PAIN:  Are you having pain: yes Pain location: none really   Pain description: irritating  Aggravating factors: doing too much Relieving factors: muscle relaxers, rest, stretching  PRECAUTIONS: Other: chronic pain syndrome and depression Other: slow progression with exercise  due to chronic condition  WEIGHT BEARING RESTRICTIONS No  FALLS:  Has patient fallen in last 6 months? No  LIVING ENVIRONMENT: Lives with: lives with their family Lives in: House/apartment  OCCUPATION: on disability  PLOF: Independent with basic ADLs and Leisure: yardwork, walking  Pt cares for her 4 young grandchildren- difficulty with care including lifting and carrying  PATIENT GOALS be more active with less pain, exercise at the gym regularly, lift and carry  grandchildren and laundry, sleep with fewer interruptions, get back to more gardening.  OBJECTIVE:   DIAGNOSTIC FINDINGS: none recent   PATIENT SURVEYS: Eval: Fibromyalgia Impact Questionnaire: first 2 sections only: 49 12/26/21: 40  02/28/22: 28 (first 2 sections only)  COGNITION: Overall cognitive status: Within functional limits for tasks assessed  SENSATION: WFL  POSTURE: rounded shoulders and forward head  PALPATION: Diffuse palpable tenderness over bil neck, upper traps, thoracic, lumbar and gluteals with trigger points.    CERVICAL ROM:   Active ROM A/PROM (deg) eval A/ROM 12/26/21 A/ROM 04/20/22 A/ROM 08/07/22  Flexion 55   60  Extension 40     Right lateral flexion 30 35 40 41  Left lateral flexion 35 40 45 50  Right rotation 50 60 65 70  Left rotation 40 65 65 75   (Blank rows = not tested) TMJ: opening limited by 25%, clicking with opening on the Rt.  Palpable tenderness over Rt masseter and at TMJ UPPER EXTREMITY ROM: UE A/ROM is limited by 20% into flexion and abduction with pain at end range.  Hip flexibility is limited by 25% in all directions with pain in all directions.  UPPER EXTREMITY MMT: UE: 4+/5, LE 4+/5 except hip flexors 4-/5  TODAY'S TREATMENT:   10/31/22: NuStep: Level 3 x 10 minutes for endurance and mobility-PT present to discuss progress. Seated hamstring stretch: 3x20 seconds Open book x10 Ball roll outs forward and lateral x10 each Seated on ball: 3 way raises with 2# weights 2x10  Marjo Bicker pose x3  Sit to stand 5# kettle bell 2x10 Sit to stand with chest press: 5# x10   10/25/22:Pt arrives for aquatic physical therapy. Treatment took place in 3.5-5.5 feet of water. Water temperature was 91 degrees F. Pt entered the pool via stairs reciprocally with mild use of rails. Pt requires buoyancy of water for support and to offload joints with strengthening exercises. Pt utilizes viscosity of the water required for strengthening. Seated water  bench with 75% submersion Pt performed seated LE AROM exercises 20x in all planes,   Standing in 50%- 75% depth water pt performed water walking in all 4 directions 10x with ankle fins added back today. VC for only work at level she felt appropriate. UE single buoy wts used for push/pull with UE and ankle fins. 50% depth standing: knee ext Bil 15x with small noodle, hip 3 ways with ankle fins 15x required some UE for balance today, Underwater bicycle 5 min with noodle in horseback followed by 3 min  decompression float.   10/24/22: NuStep: Level 3 x 10 minutes for endurance and mobility-PT present to discuss progress. Ball roll outs forward and lateral x10 each Seated on ball: 3 way raises with 2# weights 2x10  Cat cow, challenged with thoracic extension so added seated thoracic extension with ball in chair Sit to stand 5# kettle bell 2x10 Sit to stand with upright row: 5# kettle bell Sit to stand with chest press: kettle bell 2x10  HOME EXERCISE PROGRAM: Access Code: [email or web address] Access Code: [email or web address] URL:  [email or web address] Date: 09/06/2022 Prepared by: Tresa Endo                - Standing Hip Abduction with Counter Support  - 1 x daily - 7 x weekly - 1-2 sets - 10 reps - Standing Hip Extension with Chair  - 1 x daily - 7 x weekly - 1-2 sets - 10 reps ASSESSMENT:  CLINICAL IMPRESSION: Pt arrived fatigued today due to visit to the ED yesterday.  Pt participated in all exercise in the clinic without increased pain and had reduced pain and symptoms at the end of session.  PT monitored throughout session for pain and technique.  Patient will benefit from skilled PT to address the below impairments and improve overall function.   OBJECTIVE IMPAIRMENTS decreased activity tolerance, decreased endurance, difficulty walking, decreased strength, increased muscle spasms, impaired flexibility, improper body mechanics, postural dysfunction, and pain.   ACTIVITY LIMITATIONS carrying,  lifting, sitting, standing, reach over head, hygiene/grooming, and locomotion level  PARTICIPATION LIMITATIONS: meal prep, cleaning, laundry, driving, community activity, and yard work  PERSONAL FACTORS Past/current experiences, Time since onset of injury/illness/exacerbation, and 3+ comorbidities: fibromyalgia depression, anxiety,   are also affecting patient's functional outcome.   REHAB POTENTIAL: Good  CLINICAL DECISION MAKING: Evolving/moderate complexity  EVALUATION COMPLEXITY: Moderate   GOALS: Goals reviewed with patient? Yes  SHORT TERM GOALS: Target date: 03/20/22  Return to regular performance of HEP 2-3x/day to improve mobility  Baseline: stretching 2x/day (02/28/22) Goal status: In progress  2.  Report > or = to 25% reduction in cervical and lumbar pain with ADLs and self-care  Baseline: 20% better overall (02/28/2022) Goal status: in progress   3.  Reduce fibromyalgia impact score to < or = to 39  Baseline: 28 (02/28/22) Goal status: MET    LONG TERM GOALS: Target date: 11/22/22  Be independent in advanced HEP Baseline: recent flare up of fibromyalgia so not as consistent recently (10/19/22) Goal status: In progress   2.  Reduce fibromyalgia impact questionnaire to < or = to 21 Baseline: 28 (02/28/22) Goal status: Revised   3.  Lift and carry laundry and her grandchildren with > or = to 70% reduction in pain Baseline: shoulder and neck and low back 50% (10/19/22) Goal status: In progress   4.  Report > or = to 60% reduction in neck and lumbar pain with ADLs and self-care  Baseline: 50% (08/07/22) Goal status: revised   5.  Return to yardwork verbalize appropriate rest breaks and mechanics  Baseline: has worked in garden intermittently and is taking breaks (10/19/22) Goal status: In progress    6. Report > or = to 30% reduction in Rt jaw pain with chewing and talking   Baseline  jaw has been tight but no longer painful (10/19/22)  Goal Status: MET PLAN: PT  FREQUENCY: 2x/week  PT DURATION: 8 weeks  PLANNED INTERVENTIONS: Therapeutic exercises, Therapeutic activity, Neuromuscular re-education, Balance training, Gait training, Patient/Family education, Self Care, Joint mobilization, Aquatic Therapy, Dry Needling, Spinal manipulation, Spinal mobilization, Cryotherapy, Moist heat, Taping, Traction, Manual therapy, and Re-evaluation  PLAN FOR NEXT SESSION:  Plan to see on land every 3 weeks after 11/08/22  Lorrene Reid, PT 10/31/22 1:24 PM

## 2022-10-31 NOTE — Therapy (Signed)
OUTPATIENT PHYSICAL THERAPY TREATMENT   Patient Name: Danielle Bruce MRN: [id number] DOB:1960/06/28, 62 y.o., female Today's Date: 11/01/2022  PT End of Session - 11/01/22 1848     Visit Number 62    Date for PT Re-Evaluation 11/22/22    Authorization Type Medicare B- KX now    Progress Note Due on Visit 68    PT Start Time 1030   15 min late   PT Stop Time 1100    PT Time Calculation (min) 30 min    Activity Tolerance Patient tolerated treatment well    Behavior During Therapy [facility] for tasks assessed/performed                    PT End of Session - 11/01/22 1848     Visit Number 62    Date for PT Re-Evaluation 11/22/22    Authorization Type Medicare B- KX now    Progress Note Due on Visit 68    PT Start Time 1030   15 min late   PT Stop Time 1100    PT Time Calculation (min) 30 min    Activity Tolerance Patient tolerated treatment well    Behavior During Therapy [facility] for tasks assessed/performed                                                               Past Medical History:  Diagnosis Date   Abdominal pain, unspecified site 10/18/2012   Anemia    Anxiety    Arthritis    osteoarthritis   ASCUS (atypical squamous cells of undetermined significance) on Pap smear 05/06/2005   NEG HIGH RISK HPV--C&B BIOPSY BENIGN 12/2005   Asthma    Bipolar 2 disorder (HCC)    Cancer (HCC)    skin cancer - basal cell   Colon polyps    Complication of anesthesia    anxious afterwards, will get headaches    Constipation    Depression    Fibromyalgia 10/2013   GERD (gastroesophageal reflux disease)    Hearing loss on left    Heart murmur    never had any problems   Hemorrhoids    High cholesterol    High risk HPV infection 08/2011   cytology negative   IBS (irritable bowel syndrome)    Insomnia    Lymphocytic colitis    Lymphoma (HCC)    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   Bone marrow  biopsy showes 8% plasma cells IgA Lambda   Migraines    Nausea alone 10/18/2012   Osteoarthritis    Osteopenia    Peripheral neuropathy    PTSD (post-traumatic stress disorder)    Past Surgical History:  Procedure Laterality Date   BONE MARROW BIOPSY Left 12/18/2013   Plasma cell dyscrasia 8% population of plasma cells   BREAST BIOPSY Right    benign stereo   CESAREAN SECTION  40,98,11   CHOLECYSTECTOMY N/A 10/16/2012   Procedure: LAPAROSCOPIC CHOLECYSTECTOMY;  Surgeon: Clovis Pu. Cornett, MD;  Location: [facility];  Service: General;  Laterality: N/A;   COLONOSCOPY     numerous times   DILATION AND CURETTAGE OF UTERUS     ESOPHAGOGASTRODUODENOSCOPY     HEMORRHOID SURGERY  1993   x3   IUD REMOVAL  02/2015  Mirena   LAPAROSCOPIC LYSIS OF ADHESIONS N/A 10/16/2012   Procedure: LAPAROSCOPIC LYSIS OF ADHESIONS;  Surgeon: Maisie Fus A. Cornett, MD;  Location: [facility];  Service: General;  Laterality: N/A;   LAPAROSCOPY N/A 10/16/2012   Procedure: LAPAROSCOPY DIAGNOSTIC;  Surgeon: Clovis Pu. Cornett, MD;  Location: [facility];  Service: General;  Laterality: N/A;   PELVIC LAPAROSCOPY     RADIOLOGY WITH ANESTHESIA N/A 12/16/2015   Procedure: MRI OF BRAIN WITH AND WITHOUT CONTRAST;  Surgeon: Medication Radiologist, MD;  Location: [facility];  Service: Radiology;  Laterality: N/A;   SHOULDER SURGERY  2007/2008   SPINE SURGERY  2010   cervical   Patient Active Problem List   Diagnosis Date Noted   GAD (generalized anxiety disorder) 12/26/2017   OCD (obsessive compulsive disorder) 12/26/2017   PTSD (post-traumatic stress disorder) 12/26/2017   DDD (degenerative disc disease), cervical 07/14/2016   Primary osteoarthritis of both feet 07/14/2016   Primary osteoarthritis of both hands 07/14/2016   Other fatigue 07/14/2016   History of IBS 07/14/2016   Osteopenia of multiple sites 07/14/2016   Fibromyalgia 01/13/2016   MGUS (monoclonal gammopathy of unknown significance) 12/23/2013   Chronic cholecystitis  without calculus 10/18/2012   Abdominal pain, unspecified site 10/18/2012   Nausea alone 10/18/2012   Unspecified constipation 10/18/2012   Depression 10/18/2012   Anxiety    ASCUS (atypical squamous cells of undetermined significance) on Pap smear    IUD    Hemorrhoids 11/29/2010   Abdominal pain, left upper quadrant 11/29/2010    PCP:  Lupita Raider, MD  REFERRING PROVIDER: Lupita Raider, MD  REFERRING DIAG: fibromyalgia, TMJ dysfunction (added 04/20/22)  THERAPY DIAG:  Abnormal posture  Muscle weakness (generalized)  Cramp and spasm  Polymyalgia rheumatica (HCC)  Rationale for Evaluation and Treatment Rehabilitation  ONSET DATE: chronic pain (fibromyalgia) with flare-up 2 months ago  SUBJECTIVE:                                                                                                                                                                                                         SUBJECTIVE STATEMENT: Apologizes for being late. Anxiety out the roof regarding cardiology report.    PERTINENT HISTORY:  Anxiety, bipolar disorder, depression, fibromyalgia, IBS, migraines, osteopenia, PTSD  PAIN:  Are you having pain: No, high anxiety Pain location: none really   Pain description: irritating  Aggravating factors: doing too much Relieving factors: muscle relaxers, rest, stretching  PRECAUTIONS: Other: chronic pain syndrome and depression Other: slow progression with exercise due to chronic condition  WEIGHT BEARING RESTRICTIONS  No  FALLS:  Has patient fallen in last 6 months? No  LIVING ENVIRONMENT: Lives with: lives with their family Lives in: House/apartment  OCCUPATION: on disability  PLOF: Independent with basic ADLs and Leisure: yardwork, walking  Pt cares for her 4 young grandchildren- difficulty with care including lifting and carrying  PATIENT GOALS be more active with less pain, exercise at the gym regularly, lift and carry  grandchildren and laundry, sleep with fewer interruptions, get back to more gardening.  OBJECTIVE:   DIAGNOSTIC FINDINGS: none recent   PATIENT SURVEYS: Eval: Fibromyalgia Impact Questionnaire: first 2 sections only: 49 12/26/21: 40  02/28/22: 28 (first 2 sections only)  COGNITION: Overall cognitive status: Within functional limits for tasks assessed  SENSATION: WFL  POSTURE: rounded shoulders and forward head  PALPATION: Diffuse palpable tenderness over bil neck, upper traps, thoracic, lumbar and gluteals with trigger points.    CERVICAL ROM:   Active ROM A/PROM (deg) eval A/ROM 12/26/21 A/ROM 04/20/22 A/ROM 08/07/22  Flexion 55   60  Extension 40     Right lateral flexion 30 35 40 41  Left lateral flexion 35 40 45 50  Right rotation 50 60 65 70  Left rotation 40 65 65 75   (Blank rows = not tested) TMJ: opening limited by 25%, clicking with opening on the Rt.  Palpable tenderness over Rt masseter and at TMJ UPPER EXTREMITY ROM: UE A/ROM is limited by 20% into flexion and abduction with pain at end range.  Hip flexibility is limited by 25% in all directions with pain in all directions.  UPPER EXTREMITY MMT: UE: 4+/5, LE 4+/5 except hip flexors 4-/5  TODAY'S TREATMENT:   11/01/22:Pt arrives for aquatic physical therapy. Treatment took place in 3.5-5.5 feet of water. Water temperature was 91 degrees F. Pt entered the pool via stairs reciprocally with mild use of rails. Pt requires buoyancy of water for support and to offload joints with strengthening exercises. Pt utilizes viscosity of the water required for strengthening. Seated water bench with 75% submersion Pt performed seated LE AROM exercises 20x in all planes,   Standing in 50%- 75% depth water pt performed water walking in all 4 directions 6x secondary to time constraints. VC for only work at level she felt appropriate. UE single buoy wts used for push/pull with UE. 50% depth standing: knee ext Bil 15x with small  noodle, hip 3 ways with ankle fins 15x required some UE for balance today,yellow hands floats for puch down s with high knee march 4x across pool,  underwater bicycle 5 min with noodle in horseback followed by 3 min  decompression float.   10/31/22: NuStep: Level 3 x 10 minutes for endurance and mobility-PT present to discuss progress. Seated hamstring stretch: 3x20 seconds Open book x10 Ball roll outs forward and lateral x10 each Seated on ball: 3 way raises with 2# weights 2x10  Marjo Bicker pose x3  Sit to stand 5# kettle bell 2x10 Sit to stand with chest press: 5# x10   10/25/22:Pt arrives for aquatic physical therapy. Treatment took place in 3.5-5.5 feet of water. Water temperature was 91 degrees F. Pt entered the pool via stairs reciprocally with mild use of rails. Pt requires buoyancy of water for support and to offload joints with strengthening exercises. Pt utilizes viscosity of the water required for strengthening. Seated water bench with 75% submersion Pt performed seated LE AROM exercises 20x in all planes,   Standing in 50%- 75% depth water pt performed water walking in  all 4 directions 10x with ankle fins added back today. VC for only work at level she felt appropriate. UE single buoy wts used for push/pull with UE and ankle fins. 50% depth standing: knee ext Bil 15x with small noodle, hip 3 ways with ankle fins 15x required some UE for balance today, Underwater bicycle 5 min with noodle in horseback followed by 3 min  decompression float.   HOME EXERCISE PROGRAM: Access Code: [email or web address] Access Code: [email or web address] URL: [email or web address] Date: 09/06/2022 Prepared by: Tresa Endo                - Standing Hip Abduction with Counter Support  - 1 x daily - 7 x weekly - 1-2 sets - 10 reps - Standing Hip Extension with Chair  - 1 x daily - 7 x weekly - 1-2 sets - 10 reps ASSESSMENT:  CLINICAL IMPRESSION: Pt was 15 min late to aquatic appt today. Pt has high anxiety today due to a  cardiology report she read but has not discussed yet with her MD. Being submerged in the warm water was very helpful to lessen her anxiety and concentrate on her exercises which she tolerated very well.   OBJECTIVE IMPAIRMENTS decreased activity tolerance, decreased endurance, difficulty walking, decreased strength, increased muscle spasms, impaired flexibility, improper body mechanics, postural dysfunction, and pain.   ACTIVITY LIMITATIONS carrying, lifting, sitting, standing, reach over head, hygiene/grooming, and locomotion level  PARTICIPATION LIMITATIONS: meal prep, cleaning, laundry, driving, community activity, and yard work  PERSONAL FACTORS Past/current experiences, Time since onset of injury/illness/exacerbation, and 3+ comorbidities: fibromyalgia depression, anxiety,   are also affecting patient's functional outcome.   REHAB POTENTIAL: Good  CLINICAL DECISION MAKING: Evolving/moderate complexity  EVALUATION COMPLEXITY: Moderate   GOALS: Goals reviewed with patient? Yes  SHORT TERM GOALS: Target date: 03/20/22  Return to regular performance of HEP 2-3x/day to improve mobility  Baseline: stretching 2x/day (02/28/22) Goal status: In progress  2.  Report > or = to 25% reduction in cervical and lumbar pain with ADLs and self-care  Baseline: 20% better overall (02/28/2022) Goal status: in progress   3.  Reduce fibromyalgia impact score to < or = to 39  Baseline: 28 (02/28/22) Goal status: MET    LONG TERM GOALS: Target date: 11/22/22  Be independent in advanced HEP Baseline: recent flare up of fibromyalgia so not as consistent recently (10/19/22) Goal status: In progress   2.  Reduce fibromyalgia impact questionnaire to < or = to 21 Baseline: 28 (02/28/22) Goal status: Revised   3.  Lift and carry laundry and her grandchildren with > or = to 70% reduction in pain Baseline: shoulder and neck and low back 50% (10/19/22) Goal status: In progress   4.  Report > or = to 60%  reduction in neck and lumbar pain with ADLs and self-care  Baseline: 50% (08/07/22) Goal status: revised   5.  Return to yardwork verbalize appropriate rest breaks and mechanics  Baseline: has worked in garden intermittently and is taking breaks (10/19/22) Goal status: In progress    6. Report > or = to 30% reduction in Rt jaw pain with chewing and talking   Baseline  jaw has been tight but no longer painful (10/19/22)  Goal Status: MET PLAN: PT FREQUENCY: 2x/week  PT DURATION: 8 weeks  PLANNED INTERVENTIONS: Therapeutic exercises, Therapeutic activity, Neuromuscular re-education, Balance training, Gait training, Patient/Family education, Self Care, Joint mobilization, Aquatic Therapy, Dry Needling, Spinal manipulation, Spinal mobilization, Cryotherapy, Moist heat, Taping, Traction, Manual therapy, and  Re-evaluation  PLAN FOR NEXT SESSION:  Pt has 1 more aquatic visit.   Ane Payment, PTA 11/01/22 6:50 PM

## 2022-10-31 NOTE — Progress Notes (Signed)
[facility] Counselor/Therapist Progress Note  Patient ID: Danielle Bruce, MRN: [id number],    Date: 10/31/2022  Time Spent:56 minutes Time In:  3:00  Time out: 3:56  Treatment Type: Individual Therapy  Reported Symptoms: flashbacks, responding to triggers, depression, anxiety, being busy all of the time, dissociation  Mental Status Exam: Appearance:  Casual     Behavior: Appropriate  Motor: Restlestness  Speech/Language:  Clear and Coherent  Affect: Blunt  Mood: anxious  Thought process: normal  Thought content:   WNL  Sensory/Perceptual disturbances:   WNL  Orientation: oriented to person, place, time/date, and situation  Attention: Good  Concentration: Good  Memory: WNL  Fund of knowledge:  Good  Insight:   Good  Judgment:  Fair  Impulse Control: Good   Risk Assessment: Danger to Self:  No Self-injurious Behavior: No Danger to Others: No Duty to Warn:no Physical Aggression / Violence:No  Access to Firearms a concern: No  Gang Involvement:No   Subjective: The patient attended a face-to-face individual therapy via video visit today.  The patient gave verbal consent for the session to be on caregility and the patient is aware of the limitations of telehealth.  The patient was in her home alone and the therapist was in the office. The patient presents as anxious today.  The patient states that she had a conversation with her mother and it went well about their living situation.  I gave her some positive feedback for having that conversation with her mother.  The patient also talked today about wanting some tools to know how to help herself calm down when she is anxious.  We did some calming exercises and she was very grateful for having these because she felt like they would be very helpful moving forward.   Interventions: Cognitive Behavioral Therapy, Mindfulness Meditation, Eye Movement Desensitization and Reprocessing (EMDR), Insight-Oriented, and  Interpersonal, psychoeducation  Diagnosis:PTSD (post-traumatic stress disorder)  Major depressive disorder, recurrent episode, moderate (HCC)  Obsessional thoughts  Attention deficit hyperactivity disorder (ADHD), combined type  Plan: Plan of Care: Client Abilities/Strengths  Insightful, motivated, Supportive family Client Treatment Preferences  Outpatient Individual therapy/EMDR  Client Statement of Needs  "I think I have PTSD and I feel like I need another kind of therapy than talk therapy" Treatment Level  Outpatient Individual therapy  Symptoms  Demonstrates an exaggerated startle response.:  (Status: maintained). Depressed  or irritable mood.: (Status:maintained). Describes a reliving of the event,  particularly through dissociative flashbacks.: (Status: maintained). Displays a  significant decline in interest and engagement in activities.:  (Status: maintained). Displays significant psychological and/or physiological distress resulting from internal and external  clues that are reminiscent of the traumatic event.: (Status: maintained).  Experiences disturbances in sleep.:  (Status: maintained). Experiences disturbing  and persistent thoughts, images, and/or perceptions of the traumatic event.:  (Status: maintained). Experiences frequent nightmares.: (Status: maintained).  Feelings of hopelessness, worthlessness, or inappropriate guilt.: No Description Entered (Status:  improved). Has been exposed to a traumatic event involving actual or perceived threat of death or  serious injury.:  (Status: maintained). Impairment in social, occupational, or  other areas of functioning.:(Status: maintained). Intentionally avoids activities,  places, people, or objects (e.g., up-armored vehicles) that evoke memories of the event.: (Status: maintained). Intentionally avoids thoughts, feelings, or discussions related  to the traumatic event.: (Status: maintained). Reports difficulty  concentrating as  well as feelings of guilt (Status:maintained). Reports response of intense fear,  helplessness, or horror to the traumatic event.:  (Status:  maintained).  Problems Addressed  Unipolar Depression, Posttraumatic Stress Disorder (PTSD), Posttraumatic Stress Disorder (PTSD),   Posttraumatic Stress Disorder (PTSD), Posttraumatic Stress Disorder (PTSD)  Goals 1. Develop healthy thinking patterns and beliefs about self, others, and the world that lead to the alleviation and help prevent the relapse of  depression. Objective Identify and replace thoughts and beliefs that support depression. Target Date: 2023/01/04 Frequency: Weekly Progress: 30 Modality: individual Related Interventions 1. Explore and restructure underlying assumptions and beliefs reflected in biased self-talk that  may put the client at risk for relapse or recurrence. 2. Conduct Cognitive-Behavioral Therapy (see Cognitive Behavior Therapy by Reola Calkins; Overcoming Depression by Agapito Games al.), beginning with helping the client learn the connection among  cognition, depressive feelings, and actions. 2. Eliminate or reduce the negative impact trauma related symptoms have  on social, occupational, and family functioning. Objective Learn and implement personal skills to manage challenging situations related to trauma. Target Date: 2023/01/04 Frequency: Weekly Progress: 0 Modality: individual 3. No longer avoids persons, places, activities, and objects that are  reminiscent of the traumatic event. Objective Participate in Eye Movement Desensitization and Reprocessing (EMDR) to reduce emotional distress  related to traumatic thoughts, feelings, and images. Target Date: 2023/01/04 Frequency: Weekly Progress: 0 Modality: individual   Related Interventions 1. Utilize Eye Movement Desensitization and Reprocessing (EMDR) to reduce the client's  emotional reactivity to the traumatic event and reduce PTSD  symptoms. Objective Learn and implement guided self-dialogue to manage thoughts, feelings, and urges brought on by  encounters with trauma-related situations. Target Date: 2024-11/07 Frequency: Weekly Progress: 10 Modality: individual Related Interventions 1. Teach the client a guided self-dialogue procedure in which he/she learns to recognize  maladaptive self-talk, challenges its biases, copes with engendered feelings, overcomes  avoidance, and reinforces his/her accomplishments; review and reinforce progress, problemsolve obstacles. 4. No longer experiences intrusive event recollections, avoidance of event  reminders, intense arousal, or disinterest in activities or  relationships. 5. Thinks about or openly discusses the traumatic event with others  without experiencing psychological or physiological distress. Diagnosis Axis  none F43.10 (Posttraumatic stress disorder) - Open - [Signifier: n/a] Posttraumatic Stress  Disorder  Conditions For Discharge Achievement of treatment goals and objectives   Doralee Kocak G Roy Tokarz, LCSW

## 2022-10-31 NOTE — Progress Notes (Unsigned)
New Patient Note  RE: IRHAA MAGNIN MRN: [id number] DOB: September 10, 1960 Date of Office Visit: 11/01/2022  Consult requested by: Lupita Raider, MD Primary care provider: Lupita Raider, MD  Chief Complaint: Allergic Rhinitis  and Establish Care  History of Present Illness: I had the pleasure of seeing Danielle Bruce for initial evaluation at the [facility] on 11/02/2022. She is a 62 y.o. female, who is referred here by Lupita Raider, MD for the transfer of care and establishing care with an allergist.   Patient was followed by [facility] for allergic rhino conjunctivitis and reactive airway disease.   She reports symptoms of rash with dog saliva, sneezing, nasal congestion, rhinorrhea, itchy/watery eyes. Symptoms have been going on for many years. The symptoms are present all year around with worsening in spring and fall. Other triggers include exposure to dogs, cats. 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 Headache: sometimes. She has used Singulair with unknown improvement in symptoms. Takes zyrtec daily, benadryl, Flonase/azelastine prn with good benefit. Sinus infections: one. Previous work up includes: 2021 skin testing was positive to dog only. Negative to select foods - see media tab. No prior bloodwork.  Patient was on AIT in the past for about 5 years with some benefit.   She was visiting her daughter for 1 week but had issues with fatigue and not feeling well while visiting. There was apparently also mold issues there.  Patient's kids have cats now and now wondering if she has to go back on AIT to control her symptoms.   Previous ENT evaluation: yes - no prior sinus surgery. Previous sinus imaging: no. History of nasal polyps: no. Last eye exam: 2024. History of reflux: yes - takes famotidine   Assessment and Plan: Danielle Bruce is a 62 y.o. female with: Other allergic rhinitis Allergic conjunctivitis of both eyes Rhino conjunctivitis  symptoms which flare around cats and dogs. On AIT in the past with some benefit. 2021 skin testing only positive to dog. Apparently she had a lot of bleeding from the allergy testing? Records requested from prior allergist and reviewed. Get bloodwork and will make additional recommendations based on results.  Use over the counter antihistamines such as Zyrtec (cetirizine), Claritin (loratadine), Allegra (fexofenadine), or Xyzal (levocetirizine) daily as needed. May take twice a day during allergy flares. May switch antihistamines every few months. Use Flonase (fluticasone) nasal spray 1-2 sprays per nostril once a day as needed for nasal congestion.  Use azelastine nasal spray 1-2 sprays per nostril twice a day as needed for runny nose/drainage. Nasal saline spray (i.e., Simply Saline) or nasal saline lavage (i.e., NeilMed) is recommended as needed and prior to medicated nasal sprays. Use olopatadine eye drops 0.2% once a day as needed for itchy/watery eyes.  Mild intermittent reactive airway disease without complication Usually flares with URI. Had prednisone last year. Currently not on any daily inhalers but apparently was prescribed Breo in the past.  Today's spirometry was normal. May use albuterol rescue inhaler 2 puffs every 4 to 6 hours as needed for shortness of breath, chest tightness, coughing, and wheezing.  Monitor frequency of use - if you need to use it more than twice per week on a consistent basis let us know.   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 See below for proper skin care.   GERD Continue lifestyle and dietary modifications.  Lactose intolerance May use lactose free milk or take a lactaid pill  right before consuming anything with dairy.  Return in about 6 months (around 05/01/2023).  Meds ordered this encounter  Medications   azelastine (ASTELIN) 0.1 % nasal spray    Sig: Place 1-2 sprays into both nostrils 2 (two)  times daily as needed (nasal drainage). Use in each nostril as directed    Dispense:  30 mL    Refill:  3   fluticasone (FLONASE) 50 MCG/ACT nasal spray    Sig: Place 1-2 sprays into both nostrils daily as needed (nasal congestion).    Dispense:  16 g    Refill:  3   Olopatadine HCl 0.2 % SOLN    Sig: Apply 1 drop to eye daily as needed (itchy/watery eyes).    Dispense:  2.5 mL    Refill:  5   cetirizine (ZYRTEC) 10 MG tablet    Sig: Take 1 tablet (10 mg total) by mouth daily.    Dispense:  30 tablet    Refill:  11   Lab Orders         Allergens w/Total IgE Area 2      Other allergy screening: Asthma:  Usually triggered by URI - coughing, chest pain and feels like her chest is on "fire" Rare albuterol use with good benefit.  Patient had prednisone this year.   Food allergy:  patient thinks she is lactose intolerant Medication allergy: yes Hymenoptera allergy: yes Urticaria: no Eczema: Contact dermatitis - follows with dermatology.  History of recurrent infections suggestive of immunodeficency: no  Diagnostics: Spirometry:  Tracings reviewed. Her effort: Good reproducible efforts. FVC: 2.70L FEV1: 2.03L, 86% predicted FEV1/FVC ratio: 75% Interpretation: Spirometry consistent with normal pattern.  Please see scanned spirometry results for details.  Past Medical History: Patient Active Problem List   Diagnosis Date Noted   GAD (generalized anxiety disorder) 12/26/2017   OCD (obsessive compulsive disorder) 12/26/2017   PTSD (post-traumatic stress disorder) 12/26/2017   DDD (degenerative disc disease), cervical 07/14/2016   Primary osteoarthritis of both feet 07/14/2016   Primary osteoarthritis of both hands 07/14/2016   Other fatigue 07/14/2016   History of IBS 07/14/2016   Osteopenia of multiple sites 07/14/2016   Fibromyalgia 01/13/2016   MGUS (monoclonal gammopathy of unknown significance) 12/23/2013   Chronic cholecystitis without calculus 10/18/2012    Abdominal pain, unspecified site 10/18/2012   Nausea alone 10/18/2012   Unspecified constipation 10/18/2012   Depression 10/18/2012   Anxiety    ASCUS (atypical squamous cells of undetermined significance) on Pap smear    IUD    Hemorrhoids 11/29/2010   Abdominal pain, left upper quadrant 11/29/2010   Past Medical History:  Diagnosis Date   Abdominal pain, unspecified site 10/18/2012   Anemia    Anxiety    Arthritis    osteoarthritis   ASCUS (atypical squamous cells of undetermined significance) on Pap smear 05/06/2005   NEG HIGH RISK HPV--C&B BIOPSY BENIGN 12/2005   Asthma    Bipolar 2 disorder (HCC)    Cancer (HCC)    skin cancer - basal cell   Colon polyps    Complication of anesthesia    anxious afterwards, will get headaches    Constipation    Depression    Fibromyalgia 10/2013   GERD (gastroesophageal reflux disease)    Hearing loss on left    Heart murmur    never had any problems   Hemorrhoids    High cholesterol    High risk HPV infection 08/2011   cytology negative   IBS (irritable  bowel syndrome)    Insomnia    Lymphocytic colitis    Lymphoma (HCC)    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   Bone marrow biopsy showes 8% plasma cells IgA Lambda   Migraines    Nausea alone 10/18/2012   Osteoarthritis    Osteopenia    Peripheral neuropathy    PTSD (post-traumatic stress disorder)    Past Surgical History: Past Surgical History:  Procedure Laterality Date   BONE MARROW BIOPSY Left 12/18/2013   Plasma cell dyscrasia 8% population of plasma cells   BREAST BIOPSY Right    benign stereo   CESAREAN SECTION  45,40,98   CHOLECYSTECTOMY N/A 10/16/2012   Procedure: LAPAROSCOPIC CHOLECYSTECTOMY;  Surgeon: Clovis Pu. Cornett, MD;  Location: [facility];  Service: General;  Laterality: N/A;   COLONOSCOPY     numerous times   DILATION AND CURETTAGE OF UTERUS     ESOPHAGOGASTRODUODENOSCOPY     HEMORRHOID SURGERY  1993   x3   IUD REMOVAL   02/2015   Mirena   LAPAROSCOPIC LYSIS OF ADHESIONS N/A 10/16/2012   Procedure: LAPAROSCOPIC LYSIS OF ADHESIONS;  Surgeon: Maisie Fus A. Cornett, MD;  Location: [facility];  Service: General;  Laterality: N/A;   LAPAROSCOPY N/A 10/16/2012   Procedure: LAPAROSCOPY DIAGNOSTIC;  Surgeon: Clovis Pu. Cornett, MD;  Location: [facility];  Service: General;  Laterality: N/A;   PELVIC LAPAROSCOPY     RADIOLOGY WITH ANESTHESIA N/A 12/16/2015   Procedure: MRI OF BRAIN WITH AND WITHOUT CONTRAST;  Surgeon: Medication Radiologist, MD;  Location: [facility];  Service: Radiology;  Laterality: N/A;   SHOULDER SURGERY  2007/2008   SPINE SURGERY  2010   cervical   Medication List:  Current Outpatient Medications  Medication Sig Dispense Refill   albuterol (PROVENTIL HFA;VENTOLIN HFA) 108 (90 Base) MCG/ACT inhaler Inhale 2 puffs into the lungs every 6 (six) hours as needed for wheezing or shortness of breath.     azelastine (ASTELIN) 0.1 % nasal spray Place 1-2 sprays into both nostrils 2 (two) times daily as needed (nasal drainage). Use in each nostril as directed 30 mL 3   Calcium Carb-Cholecalciferol (CALCIUM 1000 + D) 1000-20 MG-MCG TABS Take by mouth.     cetirizine (ZYRTEC) 10 MG tablet Take 1 tablet (10 mg total) by mouth daily. 30 tablet 11   cyclobenzaprine (FLEXERIL) 10 MG tablet Take 10 mg by mouth at bedtime as needed.     diphenhydrAMINE (BENADRYL) 25 mg capsule Take 50 mg by mouth every 6 (six) hours as needed (HEADACHES).     Erenumab-aooe 70 MG/ML SOAJ Inject into the skin every 28 (twenty-eight) days.     fluticasone (FLONASE) 50 MCG/ACT nasal spray Place 1-2 sprays into both nostrils daily as needed (nasal congestion). 16 g 3   fluvoxaMINE (LUVOX) 50 MG tablet TAKE 1 TABLET(50 MG) BY MOUTH TWICE DAILY 180 tablet 1   hydrocortisone (ANUSOL-HC) 25 MG suppository 1 suppository     ibuprofen (ADVIL) 800 MG tablet 1 tablet with food or milk as needed     L-Methylfolate 15 MG TABS TAKE 1 TABLET BY MOUTH DAILY 30 tablet  11   levocetirizine (XYZAL) 5 MG tablet Take 5 mg by mouth every evening.  5   lisdexamfetamine (VYVANSE) 50 MG capsule Take 1 capsule (50 mg total) by mouth daily. 30 capsule 0   LORazepam (ATIVAN) 1 MG tablet Take 1 tablet (1 mg total) by mouth every 8 (eight) hours as needed for anxiety. (takes 2 mg when  having a medical procedure.) 30 tablet 1   NURTEC 75 MG TBDP Take 1 tablet by mouth daily as needed.     Olopatadine HCl 0.2 % SOLN Apply 1 drop to eye daily as needed (itchy/watery eyes). 2.5 mL 5   oxyCODONE-acetaminophen (PERCOCET/ROXICET) 5-325 MG per tablet Take 2 tablets by mouth daily as needed for moderate pain or severe pain (mighraine.).      polyethylene glycol (MIRALAX / GLYCOLAX) packet Take 17 g by mouth daily as needed for mild constipation.     pravastatin (PRAVACHOL) 40 MG tablet 1 tablet     pregabalin (LYRICA) 50 MG capsule Take 100 mg by mouth 3 (three) times daily.     PROLIA 60 MG/ML SOSY injection BRING TO THE OFFICE FOR INJECTION ON 02/15/2018 AS DIRECTED ONCE EVERY 6 MONTHS     promethazine (PHENERGAN) 25 MG tablet Take 25 mg by mouth every 6 (six) hours as needed for nausea.     traZODone (DESYREL) 50 MG tablet TAKE 1 TABLET(50 MG) BY MOUTH AT BEDTIME 90 tablet 1   VASCEPA 1 g CAPS TK 2 CS PO BID WC  3   VITAMIN D PO Take by mouth. Take 5000mg  daily     VOLTAREN 1 % GEL Apply 2 g topically 4 (four) times daily as needed (JOINT PAIN).   3   Wheat Dextrin (BENEFIBER PO) 1 Tablespoon     No current facility-administered medications for this visit.   Allergies: Allergies  Allergen Reactions   Sulfa Antibiotics Nausea And Vomiting    Causes pt to vomit blood Other reaction(s): vomiting blood   Gluten Meal Other (See Comments)    Sensitivity.    Lactose Intolerance (Gi) Nausea And Vomiting   Aspirin     Other reaction(s): colitis   Bromfed Dm [Pseudoeph-Bromphen-Dm]     Other reaction(s): strung out and hallucinating   Chlorpromazine     Other reaction(s):  Dark thoughts   Desipramine     Other reaction(s): rash   Gabapentin     Other reaction(s): swelling / fuzzy vision   Hydrocodone     Other reaction(s): causes hangovers   Lamotrigine     Other reaction(s): rash   Metronidazole     Other reaction(s): vomiting blood   Morphine Sulfate     Other reaction(s): tearful   Nsaids     Other reaction(s): colitis   Flagyl [Metronidazole Hcl] Nausea And Vomiting   Morphine And Codeine Other (See Comments)    Reaction: Depression, emotional   Social History: Social History   Socioeconomic History   Marital status: Divorced    Spouse name: Not on file   Number of children: 3   Years of education: Not on file   Highest education level: Not on file  Occupational History   Not on file  Tobacco Use   Smoking status: Former    Current packs/day: 0.00    Types: Cigarettes    Quit date: 05/13/2014    Years since quitting: 8.4   Smokeless tobacco: Never   Tobacco comments:    social   Vaping Use   Vaping status: Never Used  Substance and Sexual Activity   Alcohol use: Yes    Alcohol/week: 0.0 standard drinks of alcohol    Comment: rare   Drug use: Never   Sexual activity: Not Currently    Comment: -1st intercourse 62 yo-More than 5 partners  Other Topics Concern   Not on file  Social History Narrative   Not  on file   Social Determinants of Health   Financial Resource Strain: Not on file  Food Insecurity: Not on file  Transportation Needs: Not on file  Physical Activity: Not on file  Stress: Not on file  Social Connections: Unknown (05/11/2022)   [facility]   Social Network    Social Network: Not on file   Lives in a house. Smoking: denies Occupation: on disability  Environmental History: Water Damage/mildew in the house: no Carpet in the family room: yes Carpet in the bedroom: no Heating: gas Cooling: central Pet: no  Family History: Family History  Problem Relation Age of Onset    Allergic rhinitis Mother    Allergic rhinitis Father    Diabetes Father    Hypertension Father    Hyperlipidemia Father    Allergic rhinitis Sister    Allergic rhinitis Brother    Allergic rhinitis Brother    Allergic rhinitis Maternal Aunt    Allergic rhinitis Maternal Uncle    Allergic rhinitis Paternal Aunt    Allergic rhinitis Paternal Uncle    Allergic rhinitis Maternal Grandmother    Allergic rhinitis Maternal Grandfather    Heart disease Maternal Grandfather    Allergic rhinitis Paternal Grandmother    Heart disease Paternal Grandmother    Allergic rhinitis Paternal Grandfather    Heart disease Paternal Grandfather    Allergic rhinitis Daughter    Depression Daughter    Anxiety disorder Daughter    Asthma Daughter    Atopy Son    Eczema Son    Allergic rhinitis Son    Depression Son    Atopy Son    Allergic rhinitis Son    Depression Son    Anxiety disorder Son    Breast cancer Neg Hx    Review of Systems  Constitutional:  Negative for appetite change, chills, fever and unexpected weight change.  HENT:  Positive for congestion, postnasal drip and rhinorrhea.   Eyes:  Negative for itching.  Respiratory:  Negative for cough, chest tightness, shortness of breath and wheezing.   Cardiovascular:  Negative for chest pain.  Gastrointestinal:  Negative for abdominal pain.  Genitourinary:  Negative for difficulty urinating.  Skin:  Positive for rash.  Neurological:  Positive for headaches.    Objective: BP 112/70   Pulse 98   Temp 98.4 F (36.9 C) (Temporal)   Ht 5\' 2"  (1.575 m)   Wt 131 lb 8 oz (59.6 kg)   LMP 12/28/2010   SpO2 96%   BMI 24.05 kg/m  Body mass index is 24.05 kg/m. Physical Exam Vitals and nursing note reviewed.  Constitutional:      Appearance: Normal appearance. She is well-developed.  HENT:     Head: Normocephalic and atraumatic.     Right Ear: Tympanic membrane and external ear normal.     Left Ear: Tympanic membrane and external  ear normal.     Nose: Nose normal.     Mouth/Throat:     Mouth: Mucous membranes are moist.     Pharynx: Oropharynx is clear.  Eyes:     Conjunctiva/sclera: Conjunctivae normal.  Cardiovascular:     Rate and Rhythm: Normal rate and regular rhythm.     Heart sounds: Normal heart sounds. No murmur heard.    No friction rub. No gallop.  Pulmonary:     Effort: Pulmonary effort is normal.     Breath sounds: Normal breath sounds. No wheezing, rhonchi or rales.  Musculoskeletal:  Cervical back: Neck supple.  Skin:    General: Skin is warm.     Findings: No rash.  Neurological:     Mental Status: She is alert and oriented to person, place, and time.  Psychiatric:        Behavior: Behavior normal.   The plan was reviewed with the patient/family, and all questions/concerned were addressed.  It was my pleasure to see Danielle Bruce today and participate in her care. Please feel free to contact me with any questions or concerns.  Sincerely,  Wyline Mood, DO Allergy & Immunology  [facility] office: [phone] [facility] office: [phone]

## 2022-11-01 ENCOUNTER — Encounter: Payer: Self-pay | Admitting: Allergy

## 2022-11-01 ENCOUNTER — Ambulatory Visit: Payer: Self-pay | Admitting: *Deleted

## 2022-11-01 ENCOUNTER — Encounter: Payer: Self-pay | Admitting: Physical Therapy

## 2022-11-01 ENCOUNTER — Ambulatory Visit: Payer: Medicare Other | Admitting: Physical Therapy

## 2022-11-01 ENCOUNTER — Ambulatory Visit (INDEPENDENT_AMBULATORY_CARE_PROVIDER_SITE_OTHER): Payer: Medicare Other | Admitting: Allergy

## 2022-11-01 VITALS — BP 112/70 | HR 98 | Temp 98.4°F | Ht 62.0 in | Wt 131.5 lb

## 2022-11-01 DIAGNOSIS — M353 Polymyalgia rheumatica: Secondary | ICD-10-CM | POA: Diagnosis not present

## 2022-11-01 DIAGNOSIS — R293 Abnormal posture: Secondary | ICD-10-CM

## 2022-11-01 DIAGNOSIS — J3089 Other allergic rhinitis: Secondary | ICD-10-CM

## 2022-11-01 DIAGNOSIS — R252 Cramp and spasm: Secondary | ICD-10-CM

## 2022-11-01 DIAGNOSIS — J4520 Mild intermittent asthma, uncomplicated: Secondary | ICD-10-CM | POA: Diagnosis not present

## 2022-11-01 DIAGNOSIS — H1013 Acute atopic conjunctivitis, bilateral: Secondary | ICD-10-CM

## 2022-11-01 DIAGNOSIS — L2389 Allergic contact dermatitis due to other agents: Secondary | ICD-10-CM

## 2022-11-01 DIAGNOSIS — E739 Lactose intolerance, unspecified: Secondary | ICD-10-CM

## 2022-11-01 DIAGNOSIS — M6281 Muscle weakness (generalized): Secondary | ICD-10-CM

## 2022-11-01 DIAGNOSIS — K219 Gastro-esophageal reflux disease without esophagitis: Secondary | ICD-10-CM

## 2022-11-01 DIAGNOSIS — M542 Cervicalgia: Secondary | ICD-10-CM | POA: Diagnosis not present

## 2022-11-01 MED ORDER — FLUTICASONE PROPIONATE 50 MCG/ACT NA SUSP: 1.0000 | Freq: Every day | NASAL | 3 refills | Status: AC | PRN

## 2022-11-01 MED ORDER — CETIRIZINE HCL 10 MG PO TABS: 10.0000 mg | ORAL_TABLET | Freq: Every day | ORAL | 11 refills | Status: AC

## 2022-11-01 MED ORDER — AZELASTINE HCL 0.1 % NA SOLN: 1.0000 | Freq: Two times a day (BID) | NASAL | 3 refills | Status: AC | PRN

## 2022-11-01 MED ORDER — OLOPATADINE HCL 0.2 % OP SOLN: 1.0000 [drp] | Freq: Every day | OPHTHALMIC | 5 refills | Status: AC | PRN

## 2022-11-01 NOTE — Patient Instructions (Addendum)
Requesting records.   Environmental allergies Get bloodwork and will make additional recommendations based on results.  Use over the counter antihistamines such as Zyrtec (cetirizine), Claritin (loratadine), Allegra (fexofenadine), or Xyzal (levocetirizine) daily as needed. May take twice a day during allergy flares. May switch antihistamines every few months. Use Flonase (fluticasone) nasal spray 1-2 sprays per nostril once a day as needed for nasal congestion.  Use azelastine nasal spray 1-2 sprays per nostril twice a day as needed for runny nose/drainage. Nasal saline spray (i.e., Simply Saline) or nasal saline lavage (i.e., NeilMed) is recommended as needed and prior to medicated nasal sprays. Use olopatadine eye drops 0.2% once a day as needed for itchy/watery eyes.  Breathing Normal breathing test.  May use albuterol rescue inhaler 2 puffs every 4 to 6 hours as needed for shortness of breath, chest tightness, coughing, and wheezing.  Monitor frequency of use - if you need to use it more than twice per week on a consistent basis let us know.   Lactose intolerance May use lactose free milk or take a lactaid pill right before consuming anything with dairy.  Skin Follow up with dermatology as scheduled. See below for proper skin care.   Follow up in 6 months or sooner if needed.   Follow up with PCP regarding the EKG.   Skin care recommendations  Bath time: Always use lukewarm water. AVOID very hot or cold water. Keep bathing time to 5-10 minutes. Do NOT use bubble bath. Use a mild soap and use just enough to wash the dirty areas. Do NOT scrub skin vigorously.  After bathing, pat dry your skin with a towel. Do NOT rub or scrub the skin.  Moisturizers and prescriptions:  ALWAYS apply moisturizers immediately after bathing (within 3 minutes). This helps to lock-in moisture. Use the moisturizer several times a day over the whole body. Good summer moisturizers include: Aveeno,  CeraVe, Cetaphil. Good winter moisturizers include: Aquaphor, Vaseline, Cerave, Cetaphil, Eucerin, Vanicream. When using moisturizers along with medications, the moisturizer should be applied about one hour after applying the medication to prevent diluting effect of the medication or moisturize around where you applied the medications. When not using medications, the moisturizer can be continued twice daily as maintenance.  Laundry and clothing: Avoid laundry products with added color or perfumes. Use unscented hypo-allergenic laundry products such as Tide free, Cheer free & gentle, and All free and clear.  If the skin still seems dry or sensitive, you can try double-rinsing the clothes. Avoid tight or scratchy clothing such as wool. Do not use fabric softeners or dyer sheets.

## 2022-11-01 NOTE — Telephone Encounter (Signed)
  Chief Complaint: EKG results Symptoms: "Chest soreness ,they said in ED I'd have that." Frequency: NA Pertinent Negatives: Patient denies NA Disposition: [] ED /[] Urgent Care (no appt availability in office) / [] Appointment(In office/virtual)/ []  [facility]/ [] Home Care/ [] Refused Recommended Disposition /[] [facility]/ []  Follow-up with PCP Additional Notes:  Community line call. Pt called to review EKG results she saw in MyChart. Calling [facility].  I cannot view those results. ADvised to call PCP who pt states is out today. Advised PCP should have someone on call. Advised ED for CP, SOB worsening. ADvised to CB if needed. Pt verbalizes understanding.  Answer Assessment - Initial Assessment Questions 1. LOCATION: "Where does it hurt?"       "Soreness" 2. RADIATION: "Does the pain go anywhere else?" (e.g., into neck, jaw, arms, back)      3. ONSET: "When did the chest pain begin?" (Minutes, hours or days)       4. PATTERN: "Does the pain come and go, or has it been constant since it started?"  "Does it get worse with exertion?"       5. DURATION: "How long does it last" (e.g., seconds, minutes, hours)      6. SEVERITY: "How bad is the pain?"  (e.g., Scale 1-10; mild, moderate, or severe)    - MILD (1-3): doesn't interfere with normal activities     - MODERATE (4-7): interferes with normal activities or awakens from sleep    - SEVERE (8-10): excruciating pain, unable to do any normal activities        7. CARDIAC RISK FACTORS: "Do you have any history of heart problems or risk factors for heart disease?" (e.g., angina, prior heart attack; diabetes, high blood pressure, high cholesterol, smoker, or strong family history of heart disease)      8. PULMONARY RISK FACTORS: "Do you have any history of lung disease?"  (e.g., blood clots in lung, asthma, emphysema, birth control pills)      9. CASE: "What do you think is causing the chest pain?"      10. THER SYMPTOMS:  "Do you have any other symptoms?" (e.g., dizziness, nausea, vomiting, sweating, fever, difficulty breathing, cough)        11. PREGNANCY: "Is there any chance you are pregnant?" "When was your last menstrual period?"  Protocols used: Chest Pain-A-AH

## 2022-11-02 ENCOUNTER — Encounter: Payer: Self-pay | Admitting: Allergy

## 2022-11-04 LAB — ALLERGENS W/TOTAL IGE AREA 2
Alternaria Alternata IgE: <0.1 kU/L
Aspergillus Fumigatus IgE: <0.1 kU/L
Bermuda Grass IgE: <0.1 kU/L
Cat Dander IgE: <0.1 kU/L
Cedar, Mountain IgE: <0.1 kU/L
Cladosporium Herbarum IgE: <0.1 kU/L
Cockroach, German IgE: <0.1 kU/L
Common Silver Birch IgE: <0.1 kU/L
Cottonwood IgE: <0.1 kU/L
D Farinae IgE: <0.1 kU/L
D Pteronyssinus IgE: <0.1 kU/L
Dog Dander IgE: 1.54 kU/L — AB
Elm, American IgE: <0.1 kU/L
IgE (Immunoglobulin E), Serum: 10 [IU]/mL (ref 6–495)
Johnson Grass IgE: <0.1 kU/L
Maple/Box Elder IgE: <0.1 kU/L
Mouse Urine IgE: <0.1 kU/L
Oak, White IgE: <0.1 kU/L
Pecan, Hickory IgE: <0.1 kU/L
Penicillium Chrysogen IgE: <0.1 kU/L
Pigweed, Rough IgE: <0.1 kU/L
Ragweed, Short IgE: <0.1 kU/L
Sheep Sorrel IgE Qn: <0.1 kU/L
Timothy Grass IgE: <0.1 kU/L
White Mulberry IgE: <0.1 kU/L

## 2022-11-06 ENCOUNTER — Telehealth: Payer: Self-pay | Admitting: Allergy

## 2022-11-06 ENCOUNTER — Other Ambulatory Visit: Payer: Self-pay | Admitting: *Deleted

## 2022-11-06 MED ORDER — EPINEPHRINE 0.3 MG/0.3ML IJ SOAJ: 0.3000 mg | INTRAMUSCULAR | 1 refills | Status: DC | PRN

## 2022-11-06 NOTE — Telephone Encounter (Signed)
Patient called and stated she got a message that she was positive for dogs and see if she wanted to start shots and she stated that she did and her call back number [phone].

## 2022-11-06 NOTE — Telephone Encounter (Signed)
Called and spoke with patient and went over starting allergy injections in detail. She has Medicare and Medicaid so her vials are covered. She has been scheduled to start injections. I did advise that she would need to wait in office with [place] for 30 minutes after starting injections. EpiPen has been sent in to her requested pharmacy. Patient verbalized understanding.

## 2022-11-07 ENCOUNTER — Ambulatory Visit: Payer: Medicare Other | Admitting: Psychology

## 2022-11-08 ENCOUNTER — Ambulatory Visit: Payer: Medicare Other | Admitting: Physical Therapy

## 2022-11-10 ENCOUNTER — Other Ambulatory Visit (HOSPITAL_BASED_OUTPATIENT_CLINIC_OR_DEPARTMENT_OTHER): Payer: Self-pay

## 2022-11-14 ENCOUNTER — Ambulatory Visit (INDEPENDENT_AMBULATORY_CARE_PROVIDER_SITE_OTHER): Payer: Medicare Other | Admitting: Psychology

## 2022-11-14 ENCOUNTER — Other Ambulatory Visit: Payer: Self-pay | Admitting: Allergy

## 2022-11-14 DIAGNOSIS — F4310 Post-traumatic stress disorder, unspecified: Secondary | ICD-10-CM

## 2022-11-14 DIAGNOSIS — F331 Major depressive disorder, recurrent, moderate: Secondary | ICD-10-CM

## 2022-11-14 DIAGNOSIS — J3089 Other allergic rhinitis: Secondary | ICD-10-CM

## 2022-11-14 DIAGNOSIS — J3081 Allergic rhinitis due to animal (cat) (dog) hair and dander: Secondary | ICD-10-CM | POA: Diagnosis not present

## 2022-11-14 DIAGNOSIS — F428 Other obsessive-compulsive disorder: Secondary | ICD-10-CM

## 2022-11-14 NOTE — Progress Notes (Unsigned)
                Marleigh Kaylor G Everette Dimauro, LCSW

## 2022-11-14 NOTE — Progress Notes (Signed)
Aeroallergen Immunotherapy  Ordering Provider: Dr. Wyline Mood  Patient Details Name: Danielle Bruce MRN: [id number] Date of Birth: Mar 16, 1960  Order 1 of 1  Vial Label: D  0.5 ml (Volume)  1:10 Concentration -- Dog Epithelia   0.5  ml Extract Subtotal 4.5  ml Diluent 5.0  ml Maintenance Total  Schedule:  B Blue Vial (1:100,000): Schedule B (6 doses) Yellow Vial (1:10,000): Schedule B (6 doses) Green Vial (1:1,000): Schedule B (6 doses) Red Vial (1:100): Schedule A (14 doses)  Special Instructions: may build up every 1-2 weeks. First red vial - schedule B, once on red 0.5cc go to every 2 weeks. Second and subsequent red vials - build up weekly with faster schedule (0.1, 0.3, 0.5). Once on red 0.5cc go to every 4 weeks as maintenance.

## 2022-11-14 NOTE — Progress Notes (Signed)
VIALS EXP 11-14-23

## 2022-11-15 ENCOUNTER — Ambulatory Visit: Payer: Medicare Other

## 2022-11-16 DIAGNOSIS — F411 Generalized anxiety disorder: Secondary | ICD-10-CM | POA: Diagnosis not present

## 2022-11-16 DIAGNOSIS — J45909 Unspecified asthma, uncomplicated: Secondary | ICD-10-CM | POA: Diagnosis not present

## 2022-11-16 DIAGNOSIS — M797 Fibromyalgia: Secondary | ICD-10-CM | POA: Diagnosis not present

## 2022-11-16 DIAGNOSIS — H269 Unspecified cataract: Secondary | ICD-10-CM | POA: Diagnosis not present

## 2022-11-16 DIAGNOSIS — R9431 Abnormal electrocardiogram [ECG] [EKG]: Secondary | ICD-10-CM | POA: Diagnosis not present

## 2022-11-16 DIAGNOSIS — J309 Allergic rhinitis, unspecified: Secondary | ICD-10-CM | POA: Diagnosis not present

## 2022-11-16 DIAGNOSIS — M810 Age-related osteoporosis without current pathological fracture: Secondary | ICD-10-CM | POA: Diagnosis not present

## 2022-11-16 DIAGNOSIS — N952 Postmenopausal atrophic vaginitis: Secondary | ICD-10-CM | POA: Diagnosis not present

## 2022-11-16 DIAGNOSIS — Z8619 Personal history of other infectious and parasitic diseases: Secondary | ICD-10-CM | POA: Diagnosis not present

## 2022-11-16 DIAGNOSIS — G43909 Migraine, unspecified, not intractable, without status migrainosus: Secondary | ICD-10-CM | POA: Diagnosis not present

## 2022-11-16 DIAGNOSIS — R202 Paresthesia of skin: Secondary | ICD-10-CM | POA: Diagnosis not present

## 2022-11-16 DIAGNOSIS — Z9189 Other specified personal risk factors, not elsewhere classified: Secondary | ICD-10-CM | POA: Diagnosis not present

## 2022-11-21 ENCOUNTER — Ambulatory Visit (INDEPENDENT_AMBULATORY_CARE_PROVIDER_SITE_OTHER): Payer: Medicare Other | Admitting: Psychology

## 2022-11-21 DIAGNOSIS — F902 Attention-deficit hyperactivity disorder, combined type: Secondary | ICD-10-CM

## 2022-11-21 DIAGNOSIS — F4310 Post-traumatic stress disorder, unspecified: Secondary | ICD-10-CM

## 2022-11-21 DIAGNOSIS — F428 Other obsessive-compulsive disorder: Secondary | ICD-10-CM

## 2022-11-21 DIAGNOSIS — F331 Major depressive disorder, recurrent, moderate: Secondary | ICD-10-CM | POA: Diagnosis not present

## 2022-11-21 DIAGNOSIS — F411 Generalized anxiety disorder: Secondary | ICD-10-CM

## 2022-11-22 ENCOUNTER — Ambulatory Visit: Payer: Medicare Other

## 2022-11-22 DIAGNOSIS — M6281 Muscle weakness (generalized): Secondary | ICD-10-CM | POA: Diagnosis not present

## 2022-11-22 DIAGNOSIS — M353 Polymyalgia rheumatica: Secondary | ICD-10-CM

## 2022-11-22 DIAGNOSIS — R252 Cramp and spasm: Secondary | ICD-10-CM | POA: Diagnosis not present

## 2022-11-22 DIAGNOSIS — R293 Abnormal posture: Secondary | ICD-10-CM

## 2022-11-22 DIAGNOSIS — M542 Cervicalgia: Secondary | ICD-10-CM

## 2022-11-22 NOTE — Therapy (Signed)
OUTPATIENT PHYSICAL THERAPY TREATMENT   Patient Name: Danielle Bruce MRN: [id number] DOB:[birth year], 62 y.o., female Today's Date: 11/22/2022  PT End of Session - 11/22/22 1446     Visit Number 63    Date for PT Re-Evaluation 01/19/23    Authorization Type Medicare B- KX now    Progress Note Due on Visit 68    PT Start Time 1401    PT Stop Time 1447    PT Time Calculation (min) 46 min    Activity Tolerance Patient tolerated treatment well    Behavior During Therapy [facility] for tasks assessed/performed                     PT End of Session - 11/22/22 1446     Visit Number 63    Date for PT Re-Evaluation 01/19/23    Authorization Type Medicare B- KX now    Progress Note Due on Visit 68    PT Start Time 1401    PT Stop Time 1447    PT Time Calculation (min) 46 min    Activity Tolerance Patient tolerated treatment well    Behavior During Therapy [facility] for tasks assessed/performed                                                                Past Medical History:  Diagnosis Date   Abdominal pain, unspecified site 10/18/2012   Anemia    Anxiety    Arthritis    osteoarthritis   ASCUS (atypical squamous cells of undetermined significance) on Pap smear 05/06/2005   NEG HIGH RISK HPV--C&B BIOPSY BENIGN 12/2005   Asthma    Bipolar 2 disorder (HCC)    Cancer (HCC)    skin cancer - basal cell   Colon polyps    Complication of anesthesia    anxious afterwards, will get headaches    Constipation    Depression    Fibromyalgia 10/2013   GERD (gastroesophageal reflux disease)    Hearing loss on left    Heart murmur    never had any problems   Hemorrhoids    High cholesterol    High risk HPV infection 08/2011   cytology negative   IBS (irritable bowel syndrome)    Insomnia    Lymphocytic colitis    Lymphoma (HCC)    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   Bone marrow biopsy showes 8%  plasma cells IgA Lambda   Migraines    Nausea alone 10/18/2012   Osteoarthritis    Osteopenia    Peripheral neuropathy    PTSD (post-traumatic stress disorder)    Past Surgical History:  Procedure Laterality Date   BONE MARROW BIOPSY Left 12/18/2013   Plasma cell dyscrasia 8% population of plasma cells   BREAST BIOPSY Right    benign stereo   CESAREAN SECTION  40,34,74   CHOLECYSTECTOMY N/A 10/16/2012   Procedure: LAPAROSCOPIC CHOLECYSTECTOMY;  Surgeon: Clovis Pu. Cornett, MD;  Location: [facility];  Service: General;  Laterality: N/A;   COLONOSCOPY     numerous times   DILATION AND CURETTAGE OF UTERUS     ESOPHAGOGASTRODUODENOSCOPY     HEMORRHOID SURGERY  1993   x3   IUD REMOVAL  02/2015   Mirena   LAPAROSCOPIC  LYSIS OF ADHESIONS N/A 10/16/2012   Procedure: LAPAROSCOPIC LYSIS OF ADHESIONS;  Surgeon: Clovis Pu. Cornett, MD;  Location: [facility];  Service: General;  Laterality: N/A;   LAPAROSCOPY N/A 10/16/2012   Procedure: LAPAROSCOPY DIAGNOSTIC;  Surgeon: Clovis Pu. Cornett, MD;  Location: [facility];  Service: General;  Laterality: N/A;   PELVIC LAPAROSCOPY     RADIOLOGY WITH ANESTHESIA N/A 12/16/2015   Procedure: MRI OF BRAIN WITH AND WITHOUT CONTRAST;  Surgeon: Medication Radiologist, MD;  Location: [facility];  Service: Radiology;  Laterality: N/A;   SHOULDER SURGERY  2007/2008   SPINE SURGERY  2010   cervical   Patient Active Problem List   Diagnosis Date Noted   GAD (generalized anxiety disorder) 12/26/2017   OCD (obsessive compulsive disorder) 12/26/2017   PTSD (post-traumatic stress disorder) 12/26/2017   DDD (degenerative disc disease), cervical 07/14/2016   Primary osteoarthritis of both feet 07/14/2016   Primary osteoarthritis of both hands 07/14/2016   Other fatigue 07/14/2016   History of IBS 07/14/2016   Osteopenia of multiple sites 07/14/2016   Fibromyalgia 01/13/2016   MGUS (monoclonal gammopathy of unknown significance) 12/23/2013   Chronic cholecystitis without calculus  10/18/2012   Abdominal pain, unspecified site 10/18/2012   Nausea alone 10/18/2012   Unspecified constipation 10/18/2012   Depression 10/18/2012   Anxiety    ASCUS (atypical squamous cells of undetermined significance) on Pap smear    IUD    Hemorrhoids 11/29/2010   Abdominal pain, left upper quadrant 11/29/2010    PCP:  Lupita Raider, MD  REFERRING PROVIDER: Lupita Raider, MD  REFERRING DIAG: fibromyalgia, TMJ dysfunction (added 04/20/22)  THERAPY DIAG:  Abnormal posture - Plan: PT plan of care cert/re-cert  Muscle weakness (generalized) - Plan: PT plan of care cert/re-cert  Cramp and spasm - Plan: PT plan of care cert/re-cert  Polymyalgia rheumatica (HCC) - Plan: PT plan of care cert/re-cert  Cervicalgia - Plan: PT plan of care cert/re-cert  Rationale for Evaluation and Treatment Rehabilitation  ONSET DATE: chronic pain (fibromyalgia) with flare-up 2 months ago  SUBJECTIVE:                                                                                                                                                                                                         SUBJECTIVE STATEMENT: I've been staying active.  I will see a hand doctor on Friday. I used my arms a lot washing dishes at my daughter's house and pain began then.      PERTINENT HISTORY:  Anxiety, bipolar disorder, depression, fibromyalgia, IBS, migraines, osteopenia,  PTSD  PAIN:  Are you having pain: No, high anxiety Pain location: none really   Pain description: irritating  Aggravating factors: doing too much Relieving factors: muscle relaxers, rest, stretching  PRECAUTIONS: Other: chronic pain syndrome and depression Other: slow progression with exercise due to chronic condition  WEIGHT BEARING RESTRICTIONS No  FALLS:  Has patient fallen in last 6 months? No  LIVING ENVIRONMENT: Lives with: lives with their family Lives in: House/apartment  OCCUPATION: on disability  PLOF:  Independent with basic ADLs and Leisure: yardwork, walking  Pt cares for her 4 young grandchildren- difficulty with care including lifting and carrying  PATIENT GOALS be more active with less pain, exercise at the gym regularly, lift and carry grandchildren and laundry, sleep with fewer interruptions, get back to more gardening.  OBJECTIVE:   DIAGNOSTIC FINDINGS: none recent   PATIENT SURVEYS:  The Patient-Specific Functional Scale  Initial:  I am going to ask you to identify up to 3 important activities that you are unable to do or are having difficulty with as a result of this problem.  Today are there any activities that you are unable to do or having difficulty with because of this?  (Patient shown scale and patient rated each activity)  Follow up: When you first came in you had difficulty performing these activities.  Today do you still have difficulty?  Patient-Specific activity scoring scheme (Point to one number):  0 1 2 3 4 5 6 7 8 9  10 Unable                                                                                                          Able to perform To perform                                                                                                    activity at the same Activity         Level as before  Injury or problem  Activity   Lifting laundry and groceries                            Initial:       2  (11/22/22)              follow up:  2.    Yard work- up and down with squatting          Initial:     4-5 (11/22/22)                  follow up:  3.    Drive long distances                                           Initial:   3-4 (11/22/22)                    follow up:    COGNITION: Overall cognitive status: Within functional limits for tasks assessed  SENSATION: WFL  POSTURE: rounded shoulders and forward  head  PALPATION: Diffuse palpable tenderness over bil neck, upper traps, thoracic, lumbar and gluteals with trigger points.    CERVICAL ROM:   Active ROM A/ROM 04/20/22 A/ROM 08/07/22  Flexion  60  Extension    Right lateral flexion 40 41  Left lateral flexion 45 50  Right rotation 65 70  Left rotation 65 75   (Blank rows = not tested) TMJ: opening limited by 25%, clicking with opening on the Rt.  Palpable tenderness over Rt masseter and at TMJ UPPER EXTREMITY ROM: UE A/ROM is limited by 20% into flexion and abduction with pain at end range.  Hip flexibility is limited by 25% in all directions with pain in all directions.  UPPER EXTREMITY MMT: UE: 4+/5, LE 4+/5 except hip flexors 4-/5  TODAY'S TREATMENT:  11/22/22: NuStep: Level 3 x 10 minutes for endurance and mobility-PT present to discuss progress. Seated hamstring stretch: 3x20 seconds, figure 4 3x20 seconds Leg press: seat at 5, 70# 2x10, 30# Rt and Lt 2x10 bil each Ball roll outs forward and lateral x10 each Seated on ball: 3 way raises with 3# weights 2x10  Sit to stand 5# kettle bell 2x10 Sit to stand with chest press: 5# x10  11/01/22:Pt arrives for aquatic physical therapy. Treatment took place in 3.5-5.5 feet of water. Water temperature was 91 degrees F. Pt entered the pool via stairs reciprocally with mild use of rails. Pt requires buoyancy of water for support and to offload joints with strengthening exercises. Pt utilizes viscosity of the water required for strengthening. Seated water bench with 75% submersion Pt performed seated LE AROM exercises 20x in all planes,   Standing in 50%- 75% depth water pt performed water walking in all 4 directions 6x secondary to time constraints. VC for only work at level she felt appropriate. UE single buoy wts used for push/pull with UE. 50% depth standing: knee ext Bil 15x with small noodle, hip 3 ways with ankle fins 15x required some UE for balance today,yellow hands floats for  puch down s with high knee march 4x across pool,  underwater bicycle 5 min with noodle in horseback followed by 3 min  decompression float.  10/31/22: NuStep: Level 3 x 10 minutes for endurance and mobility-PT present to discuss progress. Seated hamstring  stretch: 3x20 seconds Open book x10 Ball roll outs forward and lateral x10 each Seated on ball: 3 way raises with 2# weights 2x10  Marjo Bicker pose x3  Sit to stand 5# kettle bell 2x10 Sit to stand with chest press: 5# x10   HOME EXERCISE PROGRAM: Access Code: [email or web address] Access Code: [email or web address] URL: [email or web address] Date: 09/06/2022 Prepared by: Tresa Endo                - Standing Hip Abduction with Counter Support  - 1 x daily - 7 x weekly - 1-2 sets - 10 reps - Standing Hip Extension with Chair  - 1 x daily - 7 x weekly - 1-2 sets - 10 reps ASSESSMENT:  CLINICAL IMPRESSION: Pt with lapse in treatment due to being out of town.  She is done with aquatics and will attend PT on land every 2-3 weeks to address pain with manual therapy as needed and advancement of HEP.  Pt rated 3 functional tasks on patient specific functional scale.  Lifting laundry and groceries 2/10, squatting with yard work 4-5/10, and driving 1-6/10.  Pt with chronic pain condition and her pain, function and mental health fluctuates and impacts her progress.  Pt with increased pain today due to helping with her grandchildren and doing more housework over the past 2 weeks.  PT monitored throughout session for pain and technique.  She was able to advance to 3# weights with 3 way raises.  Patient will benefit from skilled PT to address the below impairments and improve overall function.   OBJECTIVE IMPAIRMENTS decreased activity tolerance, decreased endurance, difficulty walking, decreased strength, increased muscle spasms, impaired flexibility, improper body mechanics, postural dysfunction, and pain.   ACTIVITY LIMITATIONS carrying, lifting, sitting, standing, reach  over head, hygiene/grooming, and locomotion level  PARTICIPATION LIMITATIONS: meal prep, cleaning, laundry, driving, community activity, and yard work  PERSONAL FACTORS Past/current experiences, Time since onset of injury/illness/exacerbation, and 3+ comorbidities: fibromyalgia depression, anxiety,   are also affecting patient's functional outcome.   REHAB POTENTIAL: Good  CLINICAL DECISION MAKING: Evolving/moderate complexity  EVALUATION COMPLEXITY: Moderate   GOALS: Goals reviewed with patient? Yes  SHORT TERM GOALS: Target date: 12/22/22  Return to regular performance of HEP 4-5x/week to improve mobility  Baseline:  Goal status: In progress  2.  Rate lifting (groceries and laundry) > or = to 3/10 on patient specific functional scale (PSFS) Baseline: 2/10 Goal status: NEW  3.  Initiate walking program for exercise > or = to 2x/wk Baseline: not walking  Goal status: NEW    LONG TERM GOALS: Target date: 01/19/23  Be independent in advanced HEP Baseline: exercising when pain and mental health allows, PT assessing every 3 weeks (11/22/22) Goal status: In progress   2.  Rate squatting for yard work > or = to 6/10 on PSFS Baseline: 4-5/10 Goal status: NEW  3.  Rate driving > or = to 9/60 on PSFS Baseline: 3-4/10 Goal status: NEW   4.  Walk for exercise 3-5x/wk for exercise to improve endurance  Baseline: not walking  Goal status: NEW   5.  Rate lifting (groceries and laundry) > or = to 4/10 on PSFS  Baseline: 2/10  Goal status: NEW    PLAN: PT FREQUENCY: every other week  PT DURATION: 8 weeks  PLANNED INTERVENTIONS: Therapeutic exercises, Therapeutic activity, Neuromuscular re-education, Balance training, Gait training, Patient/Family education, Self Care, Joint mobilization, Aquatic Therapy, Dry Needling, Spinal manipulation, Spinal mobilization, Cryotherapy, Moist heat, Taping, Traction, Manual therapy, and Re-evaluation  PLAN FOR NEXT SESSION:  1 visit  every 2-3 weeks- modify HEP, manual for pain  Lorrene Reid, PT 11/22/22 2:47 PM

## 2022-11-22 NOTE — Progress Notes (Signed)
[facility] Counselor/Therapist Progress Note  Patient ID: Danielle Bruce, MRN: [id number],    Date: 11/21/2022  Time Spent:60 minutes Time In:  3:00  Time out: 4:00  Treatment Type: Individual Therapy  Reported Symptoms: flashbacks, responding to triggers, depression, anxiety, being busy all of the time, dissociation  Mental Status Exam: Appearance:  Casual     Behavior: Appropriate  Motor: normal  Speech/Language:  Clear and Coherent  Affect: Blunt  Mood: pleasant  Thought process: normal  Thought content:   WNL  Sensory/Perceptual disturbances:   WNL  Orientation: oriented to person, place, time/date, and situation  Attention: Good  Concentration: Good  Memory: WNL  Fund of knowledge:  Good  Insight:   Good  Judgment:  Fair  Impulse Control: Good   Risk Assessment: Danger to Self:  No Self-injurious Behavior: No Danger to Others: No Duty to Warn:no Physical Aggression / Violence:No  Access to Firearms a concern: No  Gang Involvement:No   Subjective: The patient attended a face-to-face individual therapy in the office today.  The patient presents as pleasant and cooperative.  She states that she got back from her daughters and had a very nice visit.  She reports that things went well when she had the conversation with her daughter after our last session and they were able to work any differences out.  The patient talked today about her concerns about her transgendered daughter.  We talked about her detaching a bit from thinking that she had done something wrong and encouraging them to be able to figure out who they are .  The patient reports that she feels like she is doing better in therapy and she still has not brought up again anything about working on the trauma piece.  I will see if she wants to work on that in our next session.  Interventions: Cognitive Behavioral Therapy, Mindfulness Meditation, Eye Movement Desensitization and Reprocessing (EMDR),  Insight-Oriented, and Interpersonal, psychoeducation  Diagnosis:PTSD (post-traumatic stress disorder)  Major depressive disorder, recurrent episode, moderate (HCC)  Obsessional thoughts  Attention deficit hyperactivity disorder (ADHD), combined type  Generalized anxiety disorder  Plan: Plan of Care: Client Abilities/Strengths  Insightful, motivated, Supportive family Client Treatment Preferences  Outpatient Individual therapy/EMDR  Client Statement of Needs  "I think I have PTSD and I feel like I need another kind of therapy than talk therapy" Treatment Level  Outpatient Individual therapy  Symptoms  Demonstrates an exaggerated startle response.:  (Status: maintained). Depressed  or irritable mood.: (Status:maintained). Describes a reliving of the event,  particularly through dissociative flashbacks.: (Status: maintained). Displays a  significant decline in interest and engagement in activities.:  (Status: maintained). Displays significant psychological and/or physiological distress resulting from internal and external  clues that are reminiscent of the traumatic event.: (Status: maintained).  Experiences disturbances in sleep.:  (Status: maintained). Experiences disturbing  and persistent thoughts, images, and/or perceptions of the traumatic event.:  (Status: maintained). Experiences frequent nightmares.: (Status: maintained).  Feelings of hopelessness, worthlessness, or inappropriate guilt.: No Description Entered (Status:  improved). Has been exposed to a traumatic event involving actual or perceived threat of death or  serious injury.:  (Status: maintained). Impairment in social, occupational, or  other areas of functioning.:(Status: maintained). Intentionally avoids activities,  places, people, or objects (e.g., up-armored vehicles) that evoke memories of the event.: (Status: maintained). Intentionally avoids thoughts, feelings, or discussions related  to the traumatic event.:  (Status: maintained). Reports difficulty concentrating as  well as feelings of guilt (Status:maintained).  Reports response of intense fear,  helplessness, or horror to the traumatic event.:  (Status: maintained).  Problems Addressed  Unipolar Depression, Posttraumatic Stress Disorder (PTSD), Posttraumatic Stress Disorder (PTSD),   Posttraumatic Stress Disorder (PTSD), Posttraumatic Stress Disorder (PTSD)  Goals 1. Develop healthy thinking patterns and beliefs about self, others, and the world that lead to the alleviation and help prevent the relapse of  depression. Objective Identify and replace thoughts and beliefs that support depression. Target Date: 2023/01/04 Frequency: Weekly Progress: 40 Modality: individual Related Interventions 1. Explore and restructure underlying assumptions and beliefs reflected in biased self-talk that  may put the client at risk for relapse or recurrence. 2. Conduct Cognitive-Behavioral Therapy (see Cognitive Behavior Therapy by Reola Calkins; Overcoming Depression by Agapito Games al.), beginning with helping the client learn the connection among  cognition, depressive feelings, and actions. 2. Eliminate or reduce the negative impact trauma related symptoms have  on social, occupational, and family functioning. Objective Learn and implement personal skills to manage challenging situations related to trauma. Target Date: 2023/01/04 Frequency: Weekly Progress: 0 Modality: individual 3. No longer avoids persons, places, activities, and objects that are  reminiscent of the traumatic event. Objective Participate in Eye Movement Desensitization and Reprocessing (EMDR) to reduce emotional distress  related to traumatic thoughts, feelings, and images. Target Date: 2023/01/04 Frequency: Weekly Progress: 0 Modality: individual   Related Interventions 1. Utilize Eye Movement Desensitization and Reprocessing (EMDR) to reduce the client's  emotional reactivity to the  traumatic event and reduce PTSD symptoms. Objective Learn and implement guided self-dialogue to manage thoughts, feelings, and urges brought on by  encounters with trauma-related situations. Target Date: 2024-11/07 Frequency: Weekly Progress: 10 Modality: individual Related Interventions 1. Teach the client a guided self-dialogue procedure in which he/she learns to recognize  maladaptive self-talk, challenges its biases, copes with engendered feelings, overcomes  avoidance, and reinforces his/her accomplishments; review and reinforce progress, problemsolve obstacles. 4. No longer experiences intrusive event recollections, avoidance of event  reminders, intense arousal, or disinterest in activities or  relationships. 5. Thinks about or openly discusses the traumatic event with others  without experiencing psychological or physiological distress. Diagnosis Axis  none F43.10 (Posttraumatic stress disorder) - Open - [Signifier: n/a] Posttraumatic Stress  Disorder  Conditions For Discharge Achievement of treatment goals and objectives   Emani Taussig G Tameca Jerez, LCSW

## 2022-11-27 DIAGNOSIS — G5601 Carpal tunnel syndrome, right upper limb: Secondary | ICD-10-CM | POA: Diagnosis not present

## 2022-11-27 DIAGNOSIS — M180 Bilateral primary osteoarthritis of first carpometacarpal joints: Secondary | ICD-10-CM | POA: Diagnosis not present

## 2022-11-27 DIAGNOSIS — G5602 Carpal tunnel syndrome, left upper limb: Secondary | ICD-10-CM | POA: Diagnosis not present

## 2022-11-28 ENCOUNTER — Ambulatory Visit (INDEPENDENT_AMBULATORY_CARE_PROVIDER_SITE_OTHER): Payer: Medicare Other | Admitting: Psychology

## 2022-11-28 ENCOUNTER — Ambulatory Visit (INDEPENDENT_AMBULATORY_CARE_PROVIDER_SITE_OTHER): Payer: Medicare Other | Admitting: *Deleted

## 2022-11-28 DIAGNOSIS — J309 Allergic rhinitis, unspecified: Secondary | ICD-10-CM | POA: Diagnosis not present

## 2022-11-28 DIAGNOSIS — F902 Attention-deficit hyperactivity disorder, combined type: Secondary | ICD-10-CM | POA: Diagnosis not present

## 2022-11-28 DIAGNOSIS — F428 Other obsessive-compulsive disorder: Secondary | ICD-10-CM | POA: Diagnosis not present

## 2022-11-28 DIAGNOSIS — F331 Major depressive disorder, recurrent, moderate: Secondary | ICD-10-CM | POA: Diagnosis not present

## 2022-11-28 DIAGNOSIS — F4310 Post-traumatic stress disorder, unspecified: Secondary | ICD-10-CM | POA: Diagnosis not present

## 2022-11-28 NOTE — Progress Notes (Signed)
Immunotherapy   Patient Details  Name: Danielle Bruce MRN: [id number] Date of Birth: [birth year]  11/28/2022  Danielle Bruce started injections for  DOG Following schedule: B  Frequency:2 times per week Epi-Pen:Epi-Pen Available  Consent signed and patient instructions given. Patient started allergy injections and received 0.68mL of DOG in the RUA. Patient waited 30 minutes in office and did not experience any issues.   Hulon Ferron Fernandez-Vernon 11/28/2022, 2:31 PM
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
## 2022-12-05 ENCOUNTER — Ambulatory Visit (INDEPENDENT_AMBULATORY_CARE_PROVIDER_SITE_OTHER): Payer: Medicare Other | Admitting: Psychology

## 2022-12-05 DIAGNOSIS — M797 Fibromyalgia: Secondary | ICD-10-CM

## 2022-12-05 DIAGNOSIS — F902 Attention-deficit hyperactivity disorder, combined type: Secondary | ICD-10-CM

## 2022-12-05 DIAGNOSIS — F331 Major depressive disorder, recurrent, moderate: Secondary | ICD-10-CM

## 2022-12-05 DIAGNOSIS — F4310 Post-traumatic stress disorder, unspecified: Secondary | ICD-10-CM

## 2022-12-05 DIAGNOSIS — F428 Other obsessive-compulsive disorder: Secondary | ICD-10-CM

## 2022-12-05 DIAGNOSIS — F422 Mixed obsessional thoughts and acts: Secondary | ICD-10-CM | POA: Diagnosis not present
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
## 2022-12-06 ENCOUNTER — Ambulatory Visit: Payer: Medicare Other | Attending: Family Medicine

## 2022-12-06 DIAGNOSIS — M6281 Muscle weakness (generalized): Secondary | ICD-10-CM | POA: Diagnosis not present

## 2022-12-06 DIAGNOSIS — M542 Cervicalgia: Secondary | ICD-10-CM | POA: Insufficient documentation

## 2022-12-06 DIAGNOSIS — R252 Cramp and spasm: Secondary | ICD-10-CM | POA: Diagnosis not present

## 2022-12-06 DIAGNOSIS — R293 Abnormal posture: Secondary | ICD-10-CM | POA: Insufficient documentation

## 2022-12-06 DIAGNOSIS — M353 Polymyalgia rheumatica: Secondary | ICD-10-CM | POA: Diagnosis not present

## 2022-12-06 NOTE — Therapy (Signed)
OUTPATIENT PHYSICAL THERAPY TREATMENT   Patient Name: Danielle Bruce MRN: [id number] DOB:1960/03/04, 62 y.o., female Today's Date: 12/06/2022  PT End of Session - 12/06/22 1451     Visit Number 64    Date for PT Re-Evaluation 01/19/23    Authorization Type Medicare B- KX now    Progress Note Due on Visit 68    PT Start Time 1400    PT Stop Time 1446    PT Time Calculation (min) 46 min    Activity Tolerance Patient tolerated treatment well    Behavior During Therapy [facility] for tasks assessed/performed                      PT End of Session - 12/06/22 1451     Visit Number 64    Date for PT Re-Evaluation 01/19/23    Authorization Type Medicare B- KX now    Progress Note Due on Visit 68    PT Start Time 1400    PT Stop Time 1446    PT Time Calculation (min) 46 min    Activity Tolerance Patient tolerated treatment well    Behavior During Therapy [facility] for tasks assessed/performed                                                                 Past Medical History:  Diagnosis Date   Abdominal pain, unspecified site 10/18/2012   Anemia    Anxiety    Arthritis    osteoarthritis   ASCUS (atypical squamous cells of undetermined significance) on Pap smear 05/06/2005   NEG HIGH RISK HPV--C&B BIOPSY BENIGN 12/2005   Asthma    Bipolar 2 disorder (HCC)    Cancer (HCC)    skin cancer - basal cell   Colon polyps    Complication of anesthesia    anxious afterwards, will get headaches    Constipation    Depression    Fibromyalgia 10/2013   GERD (gastroesophageal reflux disease)    Hearing loss on left    Heart murmur    never had any problems   Hemorrhoids    High cholesterol    High risk HPV infection 08/2011   cytology negative   IBS (irritable bowel syndrome)    Insomnia    Lymphocytic colitis    Lymphoma (HCC)    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   Bone marrow biopsy showes 8%  plasma cells IgA Lambda   Migraines    Nausea alone 10/18/2012   Osteoarthritis    Osteopenia    Peripheral neuropathy    PTSD (post-traumatic stress disorder)    Past Surgical History:  Procedure Laterality Date   BONE MARROW BIOPSY Left 12/18/2013   Plasma cell dyscrasia 8% population of plasma cells   BREAST BIOPSY Right    benign stereo   CESAREAN SECTION  62,13,08   CHOLECYSTECTOMY N/A 10/16/2012   Procedure: LAPAROSCOPIC CHOLECYSTECTOMY;  Surgeon: Clovis Pu. Cornett, MD;  Location: [facility];  Service: General;  Laterality: N/A;   COLONOSCOPY     numerous times   DILATION AND CURETTAGE OF UTERUS     ESOPHAGOGASTRODUODENOSCOPY     HEMORRHOID SURGERY  1993   x3   IUD REMOVAL  02/2015   Mirena  LAPAROSCOPIC LYSIS OF ADHESIONS N/A 10/16/2012   Procedure: LAPAROSCOPIC LYSIS OF ADHESIONS;  Surgeon: Clovis Pu. Cornett, MD;  Location: [facility];  Service: General;  Laterality: N/A;   LAPAROSCOPY N/A 10/16/2012   Procedure: LAPAROSCOPY DIAGNOSTIC;  Surgeon: Clovis Pu. Cornett, MD;  Location: [facility];  Service: General;  Laterality: N/A;   PELVIC LAPAROSCOPY     RADIOLOGY WITH ANESTHESIA N/A 12/16/2015   Procedure: MRI OF BRAIN WITH AND WITHOUT CONTRAST;  Surgeon: Medication Radiologist, MD;  Location: [facility];  Service: Radiology;  Laterality: N/A;   SHOULDER SURGERY  2007/2008   SPINE SURGERY  2010   cervical   Patient Active Problem List   Diagnosis Date Noted   GAD (generalized anxiety disorder) 12/26/2017   OCD (obsessive compulsive disorder) 12/26/2017   PTSD (post-traumatic stress disorder) 12/26/2017   DDD (degenerative disc disease), cervical 07/14/2016   Primary osteoarthritis of both feet 07/14/2016   Primary osteoarthritis of both hands 07/14/2016   Other fatigue 07/14/2016   History of IBS 07/14/2016   Osteopenia of multiple sites 07/14/2016   Fibromyalgia 01/13/2016   MGUS (monoclonal gammopathy of unknown significance) 12/23/2013   Chronic cholecystitis without calculus  10/18/2012   Abdominal pain 10/18/2012   Nausea alone 10/18/2012   Constipation 10/18/2012   Depression 10/18/2012   Anxiety    ASCUS (atypical squamous cells of undetermined significance) on Pap smear    IUD    Hemorrhoids 11/29/2010   Abdominal pain, left upper quadrant 11/29/2010    PCP:  Lupita Raider, MD  REFERRING PROVIDER: Lupita Raider, MD  REFERRING DIAG: fibromyalgia, TMJ dysfunction (added 04/20/22)  THERAPY DIAG:  Abnormal posture  Muscle weakness (generalized)  Cramp and spasm  Polymyalgia rheumatica (HCC)  Cervicalgia  Rationale for Evaluation and Treatment Rehabilitation  ONSET DATE: chronic pain (fibromyalgia) with flare-up 2 months ago  SUBJECTIVE:                                                                                                                                                                                                         SUBJECTIVE STATEMENT: I have been doing really well until I got a migraine a few days ago.  Now my neck is very tight and I am having trouble turning my head.     PERTINENT HISTORY:  Anxiety, bipolar disorder, depression, fibromyalgia, IBS, migraines, osteopenia, PTSD  PAIN:  Are you having pain: No, high anxiety Pain location: none really   Pain description: irritating  Aggravating factors: doing too much Relieving factors: muscle relaxers, rest, stretching  PRECAUTIONS: Other: chronic pain  syndrome and depression Other: slow progression with exercise due to chronic condition  WEIGHT BEARING RESTRICTIONS No  FALLS:  Has patient fallen in last 6 months? No  LIVING ENVIRONMENT: Lives with: lives with their family Lives in: House/apartment  OCCUPATION: on disability  PLOF: Independent with basic ADLs and Leisure: yardwork, walking  Pt cares for her 4 young grandchildren- difficulty with care including lifting and carrying  PATIENT GOALS be more active with less pain, exercise at the gym  regularly, lift and carry grandchildren and laundry, sleep with fewer interruptions, get back to more gardening.  OBJECTIVE:   DIAGNOSTIC FINDINGS: none recent   PATIENT SURVEYS:  The Patient-Specific Functional Scale  Initial:  I am going to ask you to identify up to 3 important activities that you are unable to do or are having difficulty with as a result of this problem.  Today are there any activities that you are unable to do or having difficulty with because of this?  (Patient shown scale and patient rated each activity)  Follow up: When you first came in you had difficulty performing these activities.  Today do you still have difficulty?  Patient-Specific activity scoring scheme (Point to one number):  0 1 2 3 4 5 6 7 8 9  10 Unable                                                                                                          Able to perform To perform                                                                                                    activity at the same Activity         Level as before                                                                                                                       Injury or problem  Activity   Lifting laundry and groceries  Initial:       2  (11/22/22)              follow up:  2.    Yard work- up and down with squatting          Initial:     4-5 (11/22/22)                  follow up:  3.    Drive long distances                                           Initial:   3-4 (11/22/22)                    follow up:    COGNITION: Overall cognitive status: Within functional limits for tasks assessed  SENSATION: WFL  POSTURE: rounded shoulders and forward head  PALPATION: Diffuse palpable tenderness over bil neck, upper traps, thoracic, lumbar and gluteals with trigger points.    CERVICAL ROM:   Active ROM A/ROM 04/20/22 A/ROM 08/07/22  Flexion  60  Extension    Right lateral flexion 40 41   Left lateral flexion 45 50  Right rotation 65 70  Left rotation 65 75   (Blank rows = not tested) TMJ: opening limited by 25%, clicking with opening on the Rt.  Palpable tenderness over Rt masseter and at TMJ UPPER EXTREMITY ROM: UE A/ROM is limited by 20% into flexion and abduction with pain at end range.  Hip flexibility is limited by 25% in all directions with pain in all directions.  UPPER EXTREMITY MMT: UE: 4+/5, LE 4+/5 except hip flexors 4-/5  TODAY'S TREATMENT:  12/06/22: NuStep: Level 3 x 10 minutes for endurance and mobility-PT present to discuss progress. Ball roll outs forward and lateral x10 each Melt Method on foam roll for neck- PT instructed and gave pt the website for instruction YouTube: Neck Decompress  [email or web address] Trigger Point Dry-Needling  Treatment instructions: Expect mild to moderate muscle soreness. S/S of pneumothorax if dry needled over a lung field, and to seek immediate medical attention should they occur. Patient verbalized understanding of these instructions and education.  Patient Consent Given: Yes Education handout provided: Previously provided Muscles treated: bil cervical multifidi, upper traps, suboccipitals Treatment response/outcome: Utilized skilled palpation to identify trigger points.  During dry needling able to palpate muscle twitch and muscle elongation  Elongation and release to bil neck and upper traps after DN Skilled palpation and monitoring by PT during dry needling  11/22/22: NuStep: Level 3 x 10 minutes for endurance and mobility-PT present to discuss progress. Seated hamstring stretch: 3x20 seconds, figure 4 3x20 seconds Leg press: seat at 5, 70# 2x10, 30# Rt and Lt 2x10 bil each Ball roll outs forward and lateral x10 each Seated on ball: 3 way raises with 3# weights 2x10  Sit to stand 5# kettle bell 2x10 Sit to stand with chest press: 5# x10  11/01/22:Pt arrives for aquatic physical therapy.  Treatment took place in 3.5-5.5 feet of water. Water temperature was 91 degrees F. Pt entered the pool via stairs reciprocally with mild use of rails. Pt requires buoyancy of water for support and to offload joints with strengthening exercises. Pt utilizes viscosity of the water required for strengthening. Seated water bench with 75% submersion Pt performed seated LE AROM exercises 20x in all planes,   Standing  in 50%- 75% depth water pt performed water walking in all 4 directions 6x secondary to time constraints. VC for only work at level she felt appropriate. UE single buoy wts used for push/pull with UE. 50% depth standing: knee ext Bil 15x with small noodle, hip 3 ways with ankle fins 15x required some UE for balance today,yellow hands floats for puch down s with high knee march 4x across pool,  underwater bicycle 5 min with noodle in horseback followed by 3 min  decompression float  HOME EXERCISE PROGRAM: Access Code: [email or web address] Access Code: [email or web address] URL: [email or web address] Date: 09/06/2022 Prepared by: Tresa Endo                - Standing Hip Abduction with Counter Support  - 1 x daily - 7 x weekly - 1-2 sets - 10 reps - Standing Hip Extension with Chair  - 1 x daily - 7 x weekly - 1-2 sets - 10 reps ASSESSMENT:  CLINICAL IMPRESSION: Pt has done very well between sessions and has been able to stay active and consistent with exercises.  Three days ago, pt got a migraine and she now has stiffness and reduced mobility in the cervical spine.  Session focused on gentle mobility and manual therapy to address muscle tension.  Pt had good response to DN with improved tissue mobility and good twitch response in the cervical spine.  Patient will benefit from skilled PT to address the below impairments and improve overall function.   OBJECTIVE IMPAIRMENTS decreased activity tolerance, decreased endurance, difficulty walking, decreased strength, increased muscle spasms, impaired flexibility,  improper body mechanics, postural dysfunction, and pain.   ACTIVITY LIMITATIONS carrying, lifting, sitting, standing, reach over head, hygiene/grooming, and locomotion level  PARTICIPATION LIMITATIONS: meal prep, cleaning, laundry, driving, community activity, and yard work  PERSONAL FACTORS Past/current experiences, Time since onset of injury/illness/exacerbation, and 3+ comorbidities: fibromyalgia depression, anxiety,   are also affecting patient's functional outcome.   REHAB POTENTIAL: Good  CLINICAL DECISION MAKING: Evolving/moderate complexity  EVALUATION COMPLEXITY: Moderate   GOALS: Goals reviewed with patient? Yes  SHORT TERM GOALS: Target date: 12/22/22  Return to regular performance of HEP 4-5x/week to improve mobility  Baseline: met since last visit (12/06/22) Goal status: In progress  2.  Rate lifting (groceries and laundry) > or = to 3/10 on patient specific functional scale (PSFS) Baseline: 2/10 Goal status: NEW  3.  Initiate walking program for exercise > or = to 2x/wk Baseline: not walking  Goal status: NEW    LONG TERM GOALS: Target date: 01/19/23  Be independent in advanced HEP Baseline: exercising when pain and mental health allows, PT assessing every 3 weeks (11/22/22) Goal status: In progress   2.  Rate squatting for yard work > or = to 6/10 on PSFS Baseline: 4-5/10 Goal status: NEW  3.  Rate driving > or = to 6/43 on PSFS Baseline: 3-4/10 Goal status: NEW   4.  Walk for exercise 3-5x/wk for exercise to improve endurance  Baseline: not walking  Goal status: NEW   5.  Rate lifting (groceries and laundry) > or = to 4/10 on PSFS  Baseline: 2/10  Goal status: NEW    PLAN: PT FREQUENCY: every other week  PT DURATION: 8 weeks  PLANNED INTERVENTIONS: Therapeutic exercises, Therapeutic activity, Neuromuscular re-education, Balance training, Gait training, Patient/Family education, Self Care, Joint mobilization, Aquatic Therapy, Dry Needling,  Spinal manipulation, Spinal mobilization, Cryotherapy, Moist heat, Taping, Traction, Manual therapy, and Re-evaluation  PLAN FOR NEXT SESSION:  1  visit every 2-3 weeks- modify HEP, manual for pain  Lorrene Reid, PT 12/06/22 2:53 PM

## 2022-12-08 ENCOUNTER — Ambulatory Visit (INDEPENDENT_AMBULATORY_CARE_PROVIDER_SITE_OTHER): Payer: Medicare Other | Admitting: *Deleted

## 2022-12-08 DIAGNOSIS — J309 Allergic rhinitis, unspecified: Secondary | ICD-10-CM

## 2022-12-11 ENCOUNTER — Ambulatory Visit (INDEPENDENT_AMBULATORY_CARE_PROVIDER_SITE_OTHER): Payer: Self-pay

## 2022-12-11 DIAGNOSIS — J309 Allergic rhinitis, unspecified: Secondary | ICD-10-CM

## 2022-12-12 ENCOUNTER — Ambulatory Visit (INDEPENDENT_AMBULATORY_CARE_PROVIDER_SITE_OTHER): Payer: Medicare Other | Admitting: Psychology

## 2022-12-12 DIAGNOSIS — F428 Other obsessive-compulsive disorder: Secondary | ICD-10-CM | POA: Diagnosis not present

## 2022-12-12 DIAGNOSIS — F331 Major depressive disorder, recurrent, moderate: Secondary | ICD-10-CM

## 2022-12-12 DIAGNOSIS — F4310 Post-traumatic stress disorder, unspecified: Secondary | ICD-10-CM | POA: Diagnosis not present

## 2022-12-12 DIAGNOSIS — F902 Attention-deficit hyperactivity disorder, combined type: Secondary | ICD-10-CM | POA: Diagnosis not present

## 2022-12-12 NOTE — Progress Notes (Signed)
[facility] Counselor/Therapist Progress Note  Patient ID: Danielle Bruce, MRN: [id number],    Date: 12/12/2022  Time Spent:60 minutes Time In:  3:00  Time out: 4:00  Treatment Type: Individual Therapy  Reported Symptoms: flashbacks, responding to triggers, depression, anxiety, being busy all of the time, dissociation  Mental Status Exam: Appearance:  Casual     Behavior: Appropriate  Motor: normal  Speech/Language:  Clear and Coherent  Affect: Blunt  Mood: Pleasant and cooperative  Thought process: normal  Thought content:   WNL  Sensory/Perceptual disturbances:   WNL  Orientation: oriented to person, place, time/date, and situation  Attention: Good  Concentration: Good  Memory: WNL  Fund of knowledge:  Good  Insight:   Good  Judgment:  Fair  Impulse Control: Good   Risk Assessment: Danger to Self:  No Self-injurious Behavior: No Danger to Others: No Duty to Warn:no Physical Aggression / Violence:No  Access to Firearms a concern: No  Gang Involvement:No   Subjective: The patient attended a face-to-face individual therapy via video visit today.  The patient presents as pleasant and cooperative.  The patient gave verbal consent for the session to be on caregility and she is aware of the limitations of telehealth.  The patient was in her daughter's home alone and the therapist was in the office.  The patient reports that she is down at her daughter's house and she is taking care of her grandchildren.  The patient states that she wrote a long thing in her journal today and she says that she feels like she has made so much progress since she has started therapy with me.  She reports that she feels like she is so much more positive and things differently and it is not caring for himself down like she used to do.  We talked about her being ready to move to the next level with the EMDR and she is planning on doing that.  We talked about the importance of her  continuing to have positive thoughts and to work with herself on understanding that she has handled a lot of things before.  The patient is doing very well and we utilized CBT today and problem solving.  Interventions: Cognitive Behavioral Therapy, Mindfulness Meditation, Eye Movement Desensitization and Reprocessing (EMDR), Insight-Oriented, and Interpersonal, psychoeducation  Diagnosis:PTSD (post-traumatic stress disorder)  Major depressive disorder, recurrent episode, moderate (HCC)  Obsessional thoughts  Attention deficit hyperactivity disorder (ADHD), combined type  Plan: Plan of Care: Client Abilities/Strengths  Insightful, motivated, Supportive family Client Treatment Preferences  Outpatient Individual therapy/EMDR  Client Statement of Needs  "I think I have PTSD and I feel like I need another kind of therapy than talk therapy" Treatment Level  Outpatient Individual therapy  Symptoms  Demonstrates an exaggerated startle response.:  (Status: maintained). Depressed  or irritable mood.: (Status:maintained). Describes a reliving of the event,  particularly through dissociative flashbacks.: (Status: maintained). Displays a  significant decline in interest and engagement in activities.:  (Status: maintained). Displays significant psychological and/or physiological distress resulting from internal and external  clues that are reminiscent of the traumatic event.: (Status: maintained).  Experiences disturbances in sleep.:  (Status: maintained). Experiences disturbing  and persistent thoughts, images, and/or perceptions of the traumatic event.:  (Status: maintained). Experiences frequent nightmares.: (Status: maintained).  Feelings of hopelessness, worthlessness, or inappropriate guilt.: No Description Entered (Status:  improved). Has been exposed to a traumatic event involving actual or perceived threat of death or  serious injury.:  (  Status: maintained). Impairment in social,  occupational, or  other areas of functioning.:(Status: maintained). Intentionally avoids activities,  places, people, or objects (e.g., up-armored vehicles) that evoke memories of the event.: (Status: maintained). Intentionally avoids thoughts, feelings, or discussions related  to the traumatic event.: (Status: maintained). Reports difficulty concentrating as  well as feelings of guilt (Status:maintained). Reports response of intense fear,  helplessness, or horror to the traumatic event.:  (Status: maintained).  Problems Addressed  Unipolar Depression, Posttraumatic Stress Disorder (PTSD), Posttraumatic Stress Disorder (PTSD),   Posttraumatic Stress Disorder (PTSD), Posttraumatic Stress Disorder (PTSD)  Goals 1. Develop healthy thinking patterns and beliefs about self, others, and the world that lead to the alleviation and help prevent the relapse of  depression. Objective Identify and replace thoughts and beliefs that support depression. Target Date: 2024/01/04 Frequency: Weekly Progress: 50 Modality: individual Related Interventions 1. Explore and restructure underlying assumptions and beliefs reflected in biased self-talk that  may put the client at risk for relapse or recurrence. 2. Conduct Cognitive-Behavioral Therapy (see Cognitive Behavior Therapy by Reola Calkins; Overcoming Depression by Agapito Games al.), beginning with helping the client learn the connection among  cognition, depressive feelings, and actions. 2. Eliminate or reduce the negative impact trauma related symptoms have  on social, occupational, and family functioning. Objective Learn and implement personal skills to manage challenging situations related to trauma. Target Date: 2024/01/04 Frequency: Weekly Progress: 0 Modality: individual 3. No longer avoids persons, places, activities, and objects that are  reminiscent of the traumatic event. Objective Participate in Eye Movement Desensitization and Reprocessing (EMDR) to  reduce emotional distress  related to traumatic thoughts, feelings, and images. Target Date: 2024/01/04 Frequency: Weekly Progress: 0 Modality: individual   Related Interventions 1. Utilize Eye Movement Desensitization and Reprocessing (EMDR) to reduce the client's  emotional reactivity to the traumatic event and reduce PTSD symptoms. Objective Learn and implement guided self-dialogue to manage thoughts, feelings, and urges brought on by  encounters with trauma-related situations. Target Date: 2025-11/07 Frequency: Weekly Progress: 10 Modality: individual Related Interventions 1. Teach the client a guided self-dialogue procedure in which he/she learns to recognize  maladaptive self-talk, challenges its biases, copes with engendered feelings, overcomes  avoidance, and reinforces his/her accomplishments; review and reinforce progress, problemsolve obstacles. 4. No longer experiences intrusive event recollections, avoidance of event  reminders, intense arousal, or disinterest in activities or  relationships. 5. Thinks about or openly discusses the traumatic event with others  without experiencing psychological or physiological distress. Diagnosis Axis  none F43.10 (Posttraumatic stress disorder) - Open - [Signifier: n/a] Posttraumatic Stress  Disorder  Conditions For Discharge Achievement of treatment goals and objectives   Roran Wegner G Woody Kronberg, LCSW

## 2022-12-19 ENCOUNTER — Ambulatory Visit (INDEPENDENT_AMBULATORY_CARE_PROVIDER_SITE_OTHER): Payer: Medicare Other | Admitting: Psychology

## 2022-12-19 DIAGNOSIS — E538 Deficiency of other specified B group vitamins: Secondary | ICD-10-CM | POA: Diagnosis not present

## 2022-12-19 DIAGNOSIS — M797 Fibromyalgia: Secondary | ICD-10-CM | POA: Diagnosis not present

## 2022-12-19 DIAGNOSIS — J45909 Unspecified asthma, uncomplicated: Secondary | ICD-10-CM | POA: Diagnosis not present

## 2022-12-19 DIAGNOSIS — R202 Paresthesia of skin: Secondary | ICD-10-CM | POA: Diagnosis not present

## 2022-12-19 DIAGNOSIS — G43909 Migraine, unspecified, not intractable, without status migrainosus: Secondary | ICD-10-CM | POA: Diagnosis not present

## 2022-12-19 DIAGNOSIS — F4310 Post-traumatic stress disorder, unspecified: Secondary | ICD-10-CM

## 2022-12-19 DIAGNOSIS — F902 Attention-deficit hyperactivity disorder, combined type: Secondary | ICD-10-CM | POA: Diagnosis not present

## 2022-12-19 DIAGNOSIS — F428 Other obsessive-compulsive disorder: Secondary | ICD-10-CM | POA: Diagnosis not present

## 2022-12-19 DIAGNOSIS — F331 Major depressive disorder, recurrent, moderate: Secondary | ICD-10-CM

## 2022-12-19 DIAGNOSIS — H269 Unspecified cataract: Secondary | ICD-10-CM | POA: Diagnosis not present

## 2022-12-19 DIAGNOSIS — F411 Generalized anxiety disorder: Secondary | ICD-10-CM | POA: Diagnosis not present

## 2022-12-19 DIAGNOSIS — J309 Allergic rhinitis, unspecified: Secondary | ICD-10-CM | POA: Diagnosis not present

## 2022-12-19 DIAGNOSIS — Z23 Encounter for immunization: Secondary | ICD-10-CM | POA: Diagnosis not present

## 2022-12-19 DIAGNOSIS — M810 Age-related osteoporosis without current pathological fracture: Secondary | ICD-10-CM | POA: Diagnosis not present

## 2022-12-20 NOTE — Progress Notes (Signed)
[facility] Counselor/Therapist Progress Note  Patient ID: Danielle Bruce, MRN: [id number],    Date: 12/19/2022  Time Spent:60 minutes Time In:  3:00  Time out: 4:00  Treatment Type: Individual Therapy  Reported Symptoms: flashbacks, responding to triggers, depression, anxiety, being busy all of the time, dissociation  Mental Status Exam: Appearance:  Casual     Behavior: Appropriate  Motor: normal  Speech/Language:  Clear and Coherent  Affect: Blunt  Mood: Pleasant and cooperative  Thought process: normal  Thought content:   WNL  Sensory/Perceptual disturbances:   WNL  Orientation: oriented to person, place, time/date, and situation  Attention: Good  Concentration: Good  Memory: WNL  Fund of knowledge:  Good  Insight:   Good  Judgment:  Fair  Impulse Control: Good   Risk Assessment: Danger to Self:  No Self-injurious Behavior: No Danger to Others: No Duty to Warn:no Physical Aggression / Violence:No  Access to Firearms a concern: No  Gang Involvement:No   Subjective: The patient attended a face-to-face individual therapy in the office today.  The patient reports that she got back from taking care of the children and feels good about going to help her daughter.  Today the patient talked about struggling with [place] being [place]  She talked about a situation with an uncle and we talked about how to handle that situation with him.  The patient did not seem to want to discuss EMDR.  We do need to start working on that.   Interventions: Cognitive Behavioral Therapy, Mindfulness Meditation, Eye Movement Desensitization and Reprocessing (EMDR), Insight-Oriented, and Interpersonal, psychoeducation  Diagnosis:PTSD (post-traumatic stress disorder)  Major depressive disorder, recurrent episode, moderate (HCC)  Obsessional thoughts  Attention deficit hyperactivity disorder (ADHD), combined type  Plan: Plan of  Care: Client Abilities/Strengths  Insightful, motivated, Supportive family Client Treatment Preferences  Outpatient Individual therapy/EMDR  Client Statement of Needs  "I think I have PTSD and I feel like I need another kind of therapy than talk therapy" Treatment Level  Outpatient Individual therapy  Symptoms  Demonstrates an exaggerated startle response.:  (Status: maintained). Depressed  or irritable mood.: (Status:maintained). Describes a reliving of the event,  particularly through dissociative flashbacks.: (Status: maintained). Displays a  significant decline in interest and engagement in activities.:  (Status: maintained). Displays significant psychological and/or physiological distress resulting from internal and external  clues that are reminiscent of the traumatic event.: (Status: maintained).  Experiences disturbances in sleep.:  (Status: maintained). Experiences disturbing  and persistent thoughts, images, and/or perceptions of the traumatic event.:  (Status: maintained). Experiences frequent nightmares.: (Status: maintained).  Feelings of hopelessness, worthlessness, or inappropriate guilt.: No Description Entered (Status:  improved). Has been exposed to a traumatic event involving actual or perceived threat of death or  serious injury.:  (Status: maintained). Impairment in social, occupational, or  other areas of functioning.:(Status: maintained). Intentionally avoids activities,  places, people, or objects (e.g., up-armored vehicles) that evoke memories of the event.: (Status: maintained). Intentionally avoids thoughts, feelings, or discussions related  to the traumatic event.: (Status: maintained). Reports difficulty concentrating as  well as feelings of guilt (Status:maintained). Reports response of intense fear,  helplessness, or horror to the traumatic event.:  (Status: maintained).  Problems Addressed  Unipolar Depression, Posttraumatic Stress Disorder (PTSD),  Posttraumatic Stress Disorder (PTSD),   Posttraumatic Stress Disorder (PTSD), Posttraumatic Stress Disorder (PTSD)  Goals 1. Develop healthy thinking patterns and beliefs about self,  others, and the world that lead to the alleviation and help prevent the relapse of  depression. Objective Identify and replace thoughts and beliefs that support depression. Target Date: 2024/01/04 Frequency: Weekly Progress: 50 Modality: individual Related Interventions 1. Explore and restructure underlying assumptions and beliefs reflected in biased self-talk that  may put the client at risk for relapse or recurrence. 2. Conduct Cognitive-Behavioral Therapy (see Cognitive Behavior Therapy by Reola Calkins; Overcoming Depression by Agapito Games al.), beginning with helping the client learn the connection among  cognition, depressive feelings, and actions. 2. Eliminate or reduce the negative impact trauma related symptoms have  on social, occupational, and family functioning. Objective Learn and implement personal skills to manage challenging situations related to trauma. Target Date: 2024/01/04 Frequency: Weekly Progress: 0 Modality: individual 3. No longer avoids persons, places, activities, and objects that are  reminiscent of the traumatic event. Objective Participate in Eye Movement Desensitization and Reprocessing (EMDR) to reduce emotional distress  related to traumatic thoughts, feelings, and images. Target Date: 2024/01/04 Frequency: Weekly Progress: 0 Modality: individual   Related Interventions 1. Utilize Eye Movement Desensitization and Reprocessing (EMDR) to reduce the client's  emotional reactivity to the traumatic event and reduce PTSD symptoms. Objective Learn and implement guided self-dialogue to manage thoughts, feelings, and urges brought on by  encounters with trauma-related situations. Target Date: 2025-11/07 Frequency: Weekly Progress: 10 Modality: individual Related Interventions 1.  Teach the client a guided self-dialogue procedure in which he/she learns to recognize  maladaptive self-talk, challenges its biases, copes with engendered feelings, overcomes  avoidance, and reinforces his/her accomplishments; review and reinforce progress, problemsolve obstacles. 4. No longer experiences intrusive event recollections, avoidance of event  reminders, intense arousal, or disinterest in activities or  relationships. 5. Thinks about or openly discusses the traumatic event with others  without experiencing psychological or physiological distress. Diagnosis Axis  none F43.10 (Posttraumatic stress disorder) - Open - [Signifier: n/a] Posttraumatic Stress  Disorder  Conditions For Discharge Achievement of treatment goals and objectives   Iola Turri G Jannatul Wojdyla, LCSW

## 2022-12-23 DIAGNOSIS — H43812 Vitreous degeneration, left eye: Secondary | ICD-10-CM | POA: Diagnosis not present

## 2022-12-25 ENCOUNTER — Ambulatory Visit: Payer: Medicare Other

## 2022-12-25 DIAGNOSIS — M542 Cervicalgia: Secondary | ICD-10-CM

## 2022-12-25 DIAGNOSIS — M6281 Muscle weakness (generalized): Secondary | ICD-10-CM

## 2022-12-25 DIAGNOSIS — R252 Cramp and spasm: Secondary | ICD-10-CM | POA: Diagnosis not present

## 2022-12-25 DIAGNOSIS — R293 Abnormal posture: Secondary | ICD-10-CM | POA: Diagnosis not present

## 2022-12-25 DIAGNOSIS — M353 Polymyalgia rheumatica: Secondary | ICD-10-CM | POA: Diagnosis not present

## 2022-12-25 NOTE — Therapy (Signed)
OUTPATIENT PHYSICAL THERAPY TREATMENT   Patient Name: Danielle Bruce MRN: [id number] DOB:1960/11/14, 62 y.o., female Today's Date: 12/25/2022  PT End of Session - 12/25/22 1303     Visit Number 65    Date for PT Re-Evaluation 01/19/23    Authorization Type Medicare B- KX now    Progress Note Due on Visit 68    PT Start Time 1232    PT Stop Time 1314    PT Time Calculation (min) 42 min    Activity Tolerance Patient tolerated treatment well    Behavior During Therapy [facility] for tasks assessed/performed                       PT End of Session - 12/25/22 1303     Visit Number 65    Date for PT Re-Evaluation 01/19/23    Authorization Type Medicare B- KX now    Progress Note Due on Visit 68    PT Start Time 1232    PT Stop Time 1314    PT Time Calculation (min) 42 min    Activity Tolerance Patient tolerated treatment well    Behavior During Therapy [facility] for tasks assessed/performed                                                                  Past Medical History:  Diagnosis Date   Abdominal pain, unspecified site 10/18/2012   Anemia    Anxiety    Arthritis    osteoarthritis   ASCUS (atypical squamous cells of undetermined significance) on Pap smear 05/06/2005   NEG HIGH RISK HPV--C&B BIOPSY BENIGN 12/2005   Asthma    Bipolar 2 disorder (HCC)    Cancer (HCC)    skin cancer - basal cell   Colon polyps    Complication of anesthesia    anxious afterwards, will get headaches    Constipation    Depression    Fibromyalgia 10/2013   GERD (gastroesophageal reflux disease)    Hearing loss on left    Heart murmur    never had any problems   Hemorrhoids    High cholesterol    High risk HPV infection 08/2011   cytology negative   IBS (irritable bowel syndrome)    Insomnia    Lymphocytic colitis    Lymphoma (HCC)    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   Bone marrow biopsy  showes 8% plasma cells IgA Lambda   Migraines    Nausea alone 10/18/2012   Osteoarthritis    Osteopenia    Peripheral neuropathy    PTSD (post-traumatic stress disorder)    Past Surgical History:  Procedure Laterality Date   BONE MARROW BIOPSY Left 12/18/2013   Plasma cell dyscrasia 8% population of plasma cells   BREAST BIOPSY Right    benign stereo   CESAREAN SECTION  95,28,41   CHOLECYSTECTOMY N/A 10/16/2012   Procedure: LAPAROSCOPIC CHOLECYSTECTOMY;  Surgeon: Clovis Pu. Cornett, MD;  Location: [facility];  Service: General;  Laterality: N/A;   COLONOSCOPY     numerous times   DILATION AND CURETTAGE OF UTERUS     ESOPHAGOGASTRODUODENOSCOPY     HEMORRHOID SURGERY  1993   x3   IUD REMOVAL  02/2015  Mirena   LAPAROSCOPIC LYSIS OF ADHESIONS N/A 10/16/2012   Procedure: LAPAROSCOPIC LYSIS OF ADHESIONS;  Surgeon: Maisie Fus A. Cornett, MD;  Location: [facility];  Service: General;  Laterality: N/A;   LAPAROSCOPY N/A 10/16/2012   Procedure: LAPAROSCOPY DIAGNOSTIC;  Surgeon: Clovis Pu. Cornett, MD;  Location: [facility];  Service: General;  Laterality: N/A;   PELVIC LAPAROSCOPY     RADIOLOGY WITH ANESTHESIA N/A 12/16/2015   Procedure: MRI OF BRAIN WITH AND WITHOUT CONTRAST;  Surgeon: Medication Radiologist, MD;  Location: [facility];  Service: Radiology;  Laterality: N/A;   SHOULDER SURGERY  2007/2008   SPINE SURGERY  2010   cervical   Patient Active Problem List   Diagnosis Date Noted   GAD (generalized anxiety disorder) 12/26/2017   OCD (obsessive compulsive disorder) 12/26/2017   PTSD (post-traumatic stress disorder) 12/26/2017   DDD (degenerative disc disease), cervical 07/14/2016   Primary osteoarthritis of both feet 07/14/2016   Primary osteoarthritis of both hands 07/14/2016   Other fatigue 07/14/2016   History of IBS 07/14/2016   Osteopenia of multiple sites 07/14/2016   Fibromyalgia 01/13/2016   MGUS (monoclonal gammopathy of unknown significance) 12/23/2013   Chronic cholecystitis without  calculus 10/18/2012   Abdominal pain 10/18/2012   Nausea alone 10/18/2012   Constipation 10/18/2012   Depression 10/18/2012   Anxiety    ASCUS (atypical squamous cells of undetermined significance) on Pap smear    IUD    Hemorrhoids 11/29/2010   Abdominal pain, left upper quadrant 11/29/2010    PCP:  Lupita Raider, MD  REFERRING PROVIDER: Lupita Raider, MD  REFERRING DIAG: fibromyalgia, TMJ dysfunction (added 04/20/22)  THERAPY DIAG:  Abnormal posture  Muscle weakness (generalized)  Cramp and spasm  Polymyalgia rheumatica (HCC)  Cervicalgia  Rationale for Evaluation and Treatment Rehabilitation  ONSET DATE: chronic pain (fibromyalgia) with flare-up 2 months ago  SUBJECTIVE:                                                                                                                                                                                                         SUBJECTIVE STATEMENT: I have had a lot of stress and I haven't been sleeping well due to coughing.  I have floaters in my eyes and I need to see a specialist soon.  I have hand splints and I would like to learn a few exercises for my hands.      PERTINENT HISTORY:  Anxiety, bipolar disorder, depression, fibromyalgia, IBS, migraines, osteopenia, PTSD  PAIN:  Are you having pain: yes Pain location: low back  Pain  rating: 3/10 Pain description: irritating, annoying   Aggravating factors: doing too much Relieving factors: muscle relaxers, rest, stretching  PRECAUTIONS: Other: chronic pain syndrome and depression Other: slow progression with exercise due to chronic condition  WEIGHT BEARING RESTRICTIONS No  FALLS:  Has patient fallen in last 6 months? No  LIVING ENVIRONMENT: Lives with: lives with their family Lives in: House/apartment  OCCUPATION: on disability  PLOF: Independent with basic ADLs and Leisure: yardwork, walking  Pt cares for her 4 young grandchildren- difficulty with care  including lifting and carrying  PATIENT GOALS be more active with less pain, exercise at the gym regularly, lift and carry grandchildren and laundry, sleep with fewer interruptions, get back to more gardening.  OBJECTIVE:   DIAGNOSTIC FINDINGS: none recent   PATIENT SURVEYS:  The Patient-Specific Functional Scale  Initial:  I am going to ask you to identify up to 3 important activities that you are unable to do or are having difficulty with as a result of this problem.  Today are there any activities that you are unable to do or having difficulty with because of this?  (Patient shown scale and patient rated each activity)  Follow up: When you first came in you had difficulty performing these activities.  Today do you still have difficulty?  Patient-Specific activity scoring scheme (Point to one number):  0 1 2 3 4 5 6 7 8 9  10 Unable                                                                                                          Able to perform To perform                                                                                                    activity at the same Activity         Level as before                                                                                                                       Injury or problem  Activity   Lifting laundry and groceries  Initial:       2  (11/22/22)              follow up:  2.    Yard work- up and down with squatting          Initial:     4-5 (11/22/22)                  follow up:  3.    Drive long distances                                           Initial:   3-4 (11/22/22)                    follow up:    COGNITION: Overall cognitive status: Within functional limits for tasks assessed  SENSATION: WFL  POSTURE: rounded shoulders and forward head  PALPATION: Diffuse palpable tenderness over bil neck, upper traps, thoracic, lumbar and gluteals with trigger points.    CERVICAL ROM:    Active ROM A/ROM 04/20/22 A/ROM 08/07/22  Flexion  60  Extension    Right lateral flexion 40 41  Left lateral flexion 45 50  Right rotation 65 70  Left rotation 65 75   (Blank rows = not tested) TMJ: opening limited by 25%, clicking with opening on the Rt.  Palpable tenderness over Rt masseter and at TMJ UPPER EXTREMITY ROM: UE A/ROM is limited by 20% into flexion and abduction with pain at end range.  Hip flexibility is limited by 25% in all directions with pain in all directions.  UPPER EXTREMITY MMT: UE: 4+/5, LE 4+/5 except hip flexors 4-/5  TODAY'S TREATMENT:  12/25/22: NuStep: Level 3 x 10 minutes for endurance and mobility-PT present to discuss progress. Ball roll outs forward and lateral x10 each Melt Method on foam roll for neck- verbal cues for foam roll placement  Hand: ball squeezes, digiflex red mass grip and individual fingers Open book with foam roll: x10 Sit to stand with 5# and upright row: 2x10  Leg press: seat at 5, 70# 2x10, 30# Rt and Lt 2x10 bil each 12/06/22: NuStep: Level 3 x 10 minutes for endurance and mobility-PT present to discuss progress. Ball roll outs forward and lateral x10 each Melt Method on foam roll for neck- PT instructed and gave pt the website for instruction YouTube: Neck Decompress  [email or web address] Trigger Point Dry-Needling  Treatment instructions: Expect mild to moderate muscle soreness. S/S of pneumothorax if dry needled over a lung field, and to seek immediate medical attention should they occur. Patient verbalized understanding of these instructions and education.  Patient Consent Given: Yes Education handout provided: Previously provided Muscles treated: bil cervical multifidi, upper traps, suboccipitals Treatment response/outcome: Utilized skilled palpation to identify trigger points.  During dry needling able to palpate muscle twitch and muscle elongation  Elongation and release to bil neck and  upper traps after DN Skilled palpation and monitoring by PT during dry needling  11/22/22: NuStep: Level 3 x 10 minutes for endurance and mobility-PT present to discuss progress. Seated hamstring stretch: 3x20 seconds, figure 4 3x20 seconds Leg press: seat at 5, 70# 2x10, 30# Rt and Lt 2x10 bil each Ball roll outs forward and lateral x10 each Seated on ball: 3 way raises with 3# weights 2x10  Sit to stand 5# kettle bell 2x10 Sit to stand with chest press:  5# x10  11/01/22:Pt arrives for aquatic physical therapy. Treatment took place in 3.5-5.5 feet of water. Water temperature was 91 degrees F. Pt entered the pool via stairs reciprocally with mild use of rails. Pt requires buoyancy of water for support and to offload joints with strengthening exercises. Pt utilizes viscosity of the water required for strengthening. Seated water bench with 75% submersion Pt performed seated LE AROM exercises 20x in all planes,   Standing in 50%- 75% depth water pt performed water walking in all 4 directions 6x secondary to time constraints. VC for only work at level she felt appropriate. UE single buoy wts used for push/pull with UE. 50% depth standing: knee ext Bil 15x with small noodle, hip 3 ways with ankle fins 15x required some UE for balance today,yellow hands floats for puch down s with high knee march 4x across pool,  underwater bicycle 5 min with noodle in horseback followed by 3 min  decompression float  HOME EXERCISE PROGRAM: Access Code: [email or web address] Access Code: [email or web address] URL: [email or web address] Date: 09/06/2022 Prepared by: Tresa Endo                - Standing Hip Abduction with Counter Support  - 1 x daily - 7 x weekly - 1-2 sets - 10 reps - Standing Hip Extension with Chair  - 1 x daily - 7 x weekly - 1-2 sets - 10 reps ASSESSMENT:  CLINICAL IMPRESSION: Pt has 3 weeks between sessions and reports that she is doing her HEP.  Pt has had stress, limited sleep and coughing that has been a  challenge for her.  Session focused on gentle mobility and manual therapy to address muscle tension.  PT showed pt exercises for hand grip and finger mobility to address her OA.  Will assess progress toward PSFS items next session.  Pt has not been feeling good overall and can't rate this today.  Patient will benefit from skilled PT to address the below impairments and improve overall function.   OBJECTIVE IMPAIRMENTS decreased activity tolerance, decreased endurance, difficulty walking, decreased strength, increased muscle spasms, impaired flexibility, improper body mechanics, postural dysfunction, and pain.   ACTIVITY LIMITATIONS carrying, lifting, sitting, standing, reach over head, hygiene/grooming, and locomotion level  PARTICIPATION LIMITATIONS: meal prep, cleaning, laundry, driving, community activity, and yard work  PERSONAL FACTORS Past/current experiences, Time since onset of injury/illness/exacerbation, and 3+ comorbidities: fibromyalgia depression, anxiety,   are also affecting patient's functional outcome.   REHAB POTENTIAL: Good  CLINICAL DECISION MAKING: Evolving/moderate complexity  EVALUATION COMPLEXITY: Moderate   GOALS: Goals reviewed with patient? Yes  SHORT TERM GOALS: Target date: 12/22/22  Return to regular performance of HEP 4-5x/week to improve mobility  Baseline: met since last visit (12/06/22) Goal status: In progress  2.  Rate lifting (groceries and laundry) > or = to 3/10 on patient specific functional scale (PSFS) Baseline: 2/10 Goal status: NEW  3.  Initiate walking program for exercise > or = to 2x/wk Baseline: not walking  Goal status: NEW    LONG TERM GOALS: Target date: 01/19/23  Be independent in advanced HEP Baseline: exercising when pain and mental health allows, PT assessing every 3 weeks (11/22/22) Goal status: In progress   2.  Rate squatting for yard work > or = to 6/10 on PSFS Baseline: 4-5/10 Goal status: NEW  3.  Rate driving >  or = to 1/32 on PSFS Baseline: 3-4/10 Goal status: NEW   4.  Walk for exercise 3-5x/wk for exercise to improve endurance  Baseline: not walking  Goal status: NEW   5.  Rate lifting (groceries and laundry) > or = to 4/10 on PSFS  Baseline: 2/10  Goal status: NEW    PLAN: PT FREQUENCY: every other week  PT DURATION: 8 weeks  PLANNED INTERVENTIONS: Therapeutic exercises, Therapeutic activity, Neuromuscular re-education, Balance training, Gait training, Patient/Family education, Self Care, Joint mobilization, Aquatic Therapy, Dry Needling, Spinal manipulation, Spinal mobilization, Cryotherapy, Moist heat, Taping, Traction, Manual therapy, and Re-evaluation  PLAN FOR NEXT SESSION:  1 visit every 2-3 weeks- modify HEP, manual for pain  Lorrene Reid, PT 12/25/22 1:16 PM

## 2022-12-26 ENCOUNTER — Ambulatory Visit: Payer: Medicare Other | Admitting: Psychology

## 2022-12-27 DIAGNOSIS — H35371 Puckering of macula, right eye: Secondary | ICD-10-CM | POA: Diagnosis not present

## 2022-12-27 DIAGNOSIS — H2513 Age-related nuclear cataract, bilateral: Secondary | ICD-10-CM | POA: Diagnosis not present

## 2022-12-27 DIAGNOSIS — H43391 Other vitreous opacities, right eye: Secondary | ICD-10-CM | POA: Diagnosis not present

## 2022-12-27 DIAGNOSIS — H43811 Vitreous degeneration, right eye: Secondary | ICD-10-CM | POA: Diagnosis not present

## 2022-12-27 DIAGNOSIS — H33321 Round hole, right eye: Secondary | ICD-10-CM | POA: Diagnosis not present

## 2023-01-02 ENCOUNTER — Ambulatory Visit: Payer: Medicare Other | Admitting: Psychology

## 2023-01-04 ENCOUNTER — Ambulatory Visit: Payer: Medicare Other | Attending: Cardiology | Admitting: Cardiology

## 2023-01-04 ENCOUNTER — Encounter: Payer: Self-pay | Admitting: Cardiology

## 2023-01-04 ENCOUNTER — Ambulatory Visit: Payer: Medicare Other | Admitting: Psychiatry

## 2023-01-04 ENCOUNTER — Encounter: Payer: Self-pay | Admitting: Psychiatry

## 2023-01-04 VITALS — BP 109/66 | HR 83 | Ht 62.0 in | Wt 134.0 lb

## 2023-01-04 DIAGNOSIS — F331 Major depressive disorder, recurrent, moderate: Secondary | ICD-10-CM

## 2023-01-04 DIAGNOSIS — F422 Mixed obsessional thoughts and acts: Secondary | ICD-10-CM

## 2023-01-04 DIAGNOSIS — E785 Hyperlipidemia, unspecified: Secondary | ICD-10-CM | POA: Diagnosis not present

## 2023-01-04 DIAGNOSIS — M797 Fibromyalgia: Secondary | ICD-10-CM | POA: Diagnosis not present

## 2023-01-04 DIAGNOSIS — R9431 Abnormal electrocardiogram [ECG] [EKG]: Secondary | ICD-10-CM | POA: Insufficient documentation

## 2023-01-04 DIAGNOSIS — F4310 Post-traumatic stress disorder, unspecified: Secondary | ICD-10-CM | POA: Diagnosis not present

## 2023-01-04 DIAGNOSIS — F5105 Insomnia due to other mental disorder: Secondary | ICD-10-CM | POA: Diagnosis not present

## 2023-01-04 DIAGNOSIS — F411 Generalized anxiety disorder: Secondary | ICD-10-CM

## 2023-01-04 DIAGNOSIS — F4001 Agoraphobia with panic disorder: Secondary | ICD-10-CM | POA: Diagnosis not present

## 2023-01-04 DIAGNOSIS — F428 Other obsessive-compulsive disorder: Secondary | ICD-10-CM

## 2023-01-04 DIAGNOSIS — R079 Chest pain, unspecified: Secondary | ICD-10-CM | POA: Diagnosis not present

## 2023-01-04 DIAGNOSIS — I2510 Atherosclerotic heart disease of native coronary artery without angina pectoris: Secondary | ICD-10-CM | POA: Insufficient documentation

## 2023-01-04 DIAGNOSIS — F902 Attention-deficit hyperactivity disorder, combined type: Secondary | ICD-10-CM | POA: Diagnosis not present

## 2023-01-04 MED ORDER — LISDEXAMFETAMINE DIMESYLATE 40 MG PO CAPS: 40.0000 mg | ORAL_CAPSULE | Freq: Every day | ORAL | 0 refills | Status: DC

## 2023-01-04 MED ORDER — FLUVOXAMINE MALEATE 50 MG PO TABS: 50.0000 mg | ORAL_TABLET | Freq: Every day | ORAL | Status: DC

## 2023-01-04 MED ORDER — METOPROLOL TARTRATE 50 MG PO TABS: 50.0000 mg | ORAL_TABLET | Freq: Once | ORAL | 0 refills | Status: DC

## 2023-01-04 MED ORDER — B-12 (METHYLCOBALAMIN) 1000 MCG SL SUBL: 1000.0000 ug | SUBLINGUAL_TABLET | SUBLINGUAL | 1 refills | Status: DC

## 2023-01-04 MED ORDER — ROSUVASTATIN CALCIUM 10 MG PO TABS: 10.0000 mg | ORAL_TABLET | Freq: Every day | ORAL | 3 refills | Status: DC

## 2023-01-04 NOTE — Progress Notes (Signed)
Cardiology Office Note:    Date:  01/04/2023   ID:  Danielle Bruce, DOB 06/07/1960, MRN [id number]  PCP:  Lupita Raider, MD  Cardiologist:  None  Electrophysiologist:  None   Referring MD: Lupita Raider, MD   Chief Complaint  Patient presents with   Chest Pain    History of Present Illness:    Danielle Bruce is a 62 y.o. female with a hx of asthma, BPD, fibromyalgia, GERD, HLD, IBS, MGUS who is referred by Dr Clelia Croft for EKG abnormalities.  Previously followed with Dr Jens Som, last seen in 2020.  Holter monitor July 2020 showed sinus bradycardia, normal sinus rhythm, sinus tachycardia and rare PVC.  Echocardiogram August 2020 showed normal LV function and mild diastolic dysfunction.  Nuclear study August 2020 showed ejection fraction 70% with normal perfusion.  Calcium score 09/18/22 was 55 (86th percentile).  She had ED visit 10/2022 with chest pain.  Reports has had several episodes of crushing pain in center of chest, lasted for 15 to 30 minutes.  Since that time she has had more episodes of mild chest pressure.  Has not noted relationship with exertion but does report she gets short of breath with exertion.  She reports has lightheadedness but denies any syncope.  She reports rare palpitations.  Smoked socially years ago but no regular tobacco use.  Family history includes paternal grandmother had CVA in 53s.  Past Medical History:  Diagnosis Date   Abdominal pain, unspecified site 10/18/2012   Anemia    Anxiety    Arthritis    osteoarthritis   ASCUS (atypical squamous cells of undetermined significance) on Pap smear 05/06/2005   NEG HIGH RISK HPV--C&B BIOPSY BENIGN 12/2005   Asthma    Bipolar 2 disorder (HCC)    Cancer (HCC)    skin cancer - basal cell   Colon polyps    Complication of anesthesia    anxious afterwards, will get headaches    Constipation    Depression    Fibromyalgia 10/2013   GERD (gastroesophageal reflux disease)    Hearing loss on left    Heart  murmur    never had any problems   Hemorrhoids    High cholesterol    High risk HPV infection 08/2011   cytology negative   IBS (irritable bowel syndrome)    Insomnia    Lymphocytic colitis    Lymphoma (HCC)    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   Bone marrow biopsy showes 8% plasma cells IgA Lambda   Migraines    Nausea alone 10/18/2012   Osteoarthritis    Osteopenia    Peripheral neuropathy    PTSD (post-traumatic stress disorder)     Past Surgical History:  Procedure Laterality Date   BONE MARROW BIOPSY Left 12/18/2013   Plasma cell dyscrasia 8% population of plasma cells   BREAST BIOPSY Right    benign stereo   CESAREAN SECTION  56,43,32   CHOLECYSTECTOMY N/A 10/16/2012   Procedure: LAPAROSCOPIC CHOLECYSTECTOMY;  Surgeon: Clovis Pu. Cornett, MD;  Location: [facility];  Service: General;  Laterality: N/A;   COLONOSCOPY     numerous times   DILATION AND CURETTAGE OF UTERUS     ESOPHAGOGASTRODUODENOSCOPY     HEMORRHOID SURGERY  1993   x3   IUD REMOVAL  02/2015   Mirena   LAPAROSCOPIC LYSIS OF ADHESIONS N/A 10/16/2012   Procedure: LAPAROSCOPIC LYSIS OF ADHESIONS;  Surgeon: Maisie Fus A. Cornett, MD;  Location: [facility];  Service: General;  Laterality: N/A;   LAPAROSCOPY N/A 10/16/2012   Procedure: LAPAROSCOPY DIAGNOSTIC;  Surgeon: Clovis Pu. Cornett, MD;  Location: [facility];  Service: General;  Laterality: N/A;   PELVIC LAPAROSCOPY     RADIOLOGY WITH ANESTHESIA N/A 12/16/2015   Procedure: MRI OF BRAIN WITH AND WITHOUT CONTRAST;  Surgeon: Medication Radiologist, MD;  Location: [facility];  Service: Radiology;  Laterality: N/A;   SHOULDER SURGERY  2007/2008   SPINE SURGERY  2010   cervical    Current Medications: Current Meds  Medication Sig   albuterol (PROVENTIL HFA;VENTOLIN HFA) 108 (90 Base) MCG/ACT inhaler Inhale 2 puffs into the lungs every 6 (six) hours as needed for wheezing or shortness of breath.   azelastine (ASTELIN) 0.1 % nasal spray Place 1-2 sprays  into both nostrils 2 (two) times daily as needed (nasal drainage). Use in each nostril as directed   B-12, Methylcobalamin, 1000 MCG SUBL Place 1,000 mcg under the tongue every 30 (thirty) days.   Calcium Carb-Cholecalciferol (CALCIUM 1000 + D) 1000-20 MG-MCG TABS Take by mouth.   cetirizine (ZYRTEC) 10 MG tablet Take 1 tablet (10 mg total) by mouth daily.   cyclobenzaprine (FLEXERIL) 10 MG tablet Take 10 mg by mouth at bedtime as needed.   diphenhydrAMINE (BENADRYL) 25 mg capsule Take 50 mg by mouth every 6 (six) hours as needed (HEADACHES).   EPINEPHrine (EPIPEN 2-PAK) 0.3 mg/0.3 mL IJ SOAJ injection Inject 0.3 mg into the muscle as needed for anaphylaxis.   Erenumab-aooe 70 MG/ML SOAJ Inject into the skin every 28 (twenty-eight) days.   fluticasone (FLONASE) 50 MCG/ACT nasal spray Place 1-2 sprays into both nostrils daily as needed (nasal congestion).   fluvoxaMINE (LUVOX) 50 MG tablet Take 1 tablet (50 mg total) by mouth at bedtime.   hydrocortisone (ANUSOL-HC) 25 MG suppository 1 suppository   ibuprofen (ADVIL) 800 MG tablet 1 tablet with food or milk as needed   L-Methylfolate 15 MG TABS TAKE 1 TABLET BY MOUTH DAILY   lisdexamfetamine (VYVANSE) 40 MG capsule Take 1 capsule (40 mg total) by mouth daily.   LORazepam (ATIVAN) 1 MG tablet Take 1 tablet (1 mg total) by mouth every 8 (eight) hours as needed for anxiety. (takes 2 mg when having a medical procedure.)   metoprolol tartrate (LOPRESSOR) 50 MG tablet Take 1 tablet (50 mg total) by mouth once for 1 dose.   NURTEC 75 MG TBDP Take 1 tablet by mouth daily as needed.   Olopatadine HCl 0.2 % SOLN Apply 1 drop to eye daily as needed (itchy/watery eyes).   oxyCODONE-acetaminophen (PERCOCET/ROXICET) 5-325 MG per tablet Take 2 tablets by mouth daily as needed for moderate pain or severe pain (mighraine.).    polyethylene glycol (MIRALAX / GLYCOLAX) packet Take 17 g by mouth daily as needed for mild constipation.   pregabalin (LYRICA) 50 MG  capsule Take 100 mg by mouth 3 (three) times daily.   PROLIA 60 MG/ML SOSY injection BRING TO THE OFFICE FOR INJECTION ON 02/15/2018 AS DIRECTED ONCE EVERY 6 MONTHS   promethazine (PHENERGAN) 25 MG tablet Take 25 mg by mouth every 6 (six) hours as needed for nausea.   rosuvastatin (CRESTOR) 10 MG tablet Take 1 tablet (10 mg total) by mouth daily.   traZODone (DESYREL) 50 MG tablet TAKE 1 TABLET(50 MG) BY MOUTH AT BEDTIME   VASCEPA 1 g CAPS TK 2 CS PO BID WC   VITAMIN D PO Take by mouth. Take 5000mg  daily   VOLTAREN 1 % GEL Apply 2 g  topically 4 (four) times daily as needed (JOINT PAIN).    Wheat Dextrin (BENEFIBER PO) 1 Tablespoon   [DISCONTINUED] pravastatin (PRAVACHOL) 40 MG tablet 1 tablet     Allergies:   Sulfa antibiotics, Gluten meal, Lactose intolerance (gi), Aspirin, Bromfed dm [pseudoeph-bromphen-dm], Chlorpromazine, Desipramine, Gabapentin, Hydrocodone, Lamotrigine, Metronidazole, Morphine sulfate, Nsaids, Flagyl [metronidazole hcl], and Morphine and codeine   Social History   Socioeconomic History   Marital status: Divorced    Spouse name: Not on file   Number of children: 3   Years of education: Not on file   Highest education level: Not on file  Occupational History   Not on file  Tobacco Use   Smoking status: Former    Current packs/day: 0.00    Types: Cigarettes    Quit date: 05/13/2014    Years since quitting: 8.6   Smokeless tobacco: Never   Tobacco comments:    social   Vaping Use   Vaping status: Never Used  Substance and Sexual Activity   Alcohol use: Yes    Alcohol/week: 0.0 standard drinks of alcohol    Comment: rare   Drug use: Never   Sexual activity: Not Currently    Comment: -1st intercourse 62 yo-More than 5 partners  Other Topics Concern   Not on file  Social History Narrative   Not on file   Social Determinants of Health   Financial Resource Strain: Not on file  Food Insecurity: Not on file  Transportation Needs: Not on file  Physical  Activity: Not on file  Stress: Not on file  Social Connections: Unknown (05/11/2022)   Received from [facility]   Social Network    Social Network: Not on file     Family History: The patient's family history includes Allergic rhinitis in her brother, brother, daughter, father, maternal aunt, maternal grandfather, maternal grandmother, maternal uncle, mother, paternal aunt, paternal grandfather, paternal grandmother, paternal uncle, sister, son, and son; Anxiety disorder in her daughter and son; Asthma in her daughter; Atopy in her son and son; Depression in her daughter, son, and son; Diabetes in her father; Eczema in her son; Heart disease in her maternal grandfather, paternal grandfather, and paternal grandmother; Hyperlipidemia in her father; Hypertension in her father. There is no history of Breast cancer.  ROS:   Please see the history of present illness.     All other systems reviewed and are negative.  EKGs/Labs/Other Studies Reviewed:    The following studies were reviewed today:   EKG:   01/04/2023: Normal sinus rhythm, rate 83, no ST abnormality, inferior Q waves  Recent Labs: 10/30/2022: BUN 14; Creatinine, Ser 0.71; Hemoglobin 14.2; Platelets 247; Potassium 3.7; Sodium 140  Recent Lipid Panel No results found for: "CHOL", "TRIG", "HDL", "CHOLHDL", "VLDL", "LDLCALC", "LDLDIRECT"  Physical Exam:    VS:  BP 109/66 (BP Location: Left Arm, Patient Position: Sitting, Cuff Size: Normal)   Pulse 83   Ht 5\' 2"  (1.575 m)   Wt 134 lb (60.8 kg)   LMP 12/28/2010   SpO2 96%   BMI 24.51 kg/m     Wt Readings from Last 3 Encounters:  01/04/23 134 lb (60.8 kg)  11/01/22 131 lb 8 oz (59.6 kg)  10/30/22 131 lb (59.4 kg)     GEN:  Well nourished, well developed in no acute distress HEENT: Normal NECK: No JVD; No carotid bruits LYMPHATICS: No lymphadenopathy CARDIAC: RRR, no murmurs, rubs, gallops RESPIRATORY:  Clear to auscultation without rales, wheezing or  rhonchi  ABDOMEN: Soft, non-tender, non-distended MUSCULOSKELETAL:  No edema; No deformity  SKIN: Warm and dry NEUROLOGIC:  Alert and oriented x 3 PSYCHIATRIC:  Normal affect   ASSESSMENT:    1. Chest pain, unspecified type   2. Coronary artery disease involving native coronary artery of native heart, unspecified whether angina present   3. Hyperlipidemia, unspecified hyperlipidemia type   4. Nonspecific abnormal electrocardiogram (ECG) (EKG)    PLAN:    CAD: Calcium score 09/18/22 was 55 (86th percentile).  She is reporting atypical chest pain.  Also reporting dyspnea on exertion, which could represent anginal equivalent -Recommend coronary CTA to evaluate for obstructive CAD.  Will give Lopressor 50 mg prior to study -LDL 96 on 04/06/2022, change from pravastatin 40 mg daily to rosuvastatin 10 mg daily  Abnormal EKG: Inferior Q waves.  Check echocardiogram  HLD: on pravastatin 40 mg daily.  Changed to rosuvastatin 10 mg daily as above  RTC in 4 months  Medication Adjustments/Labs and Tests Ordered: Current medicines are reviewed at length with the patient today.  Concerns regarding medicines are outlined above.  Orders Placed This Encounter  Procedures   CT CORONARY MORPH W/CTA COR W/SCORE W/CA W/CM &/OR WO/CM   Basic Metabolic Panel (BMET)   EKG 12-Lead   ECHOCARDIOGRAM COMPLETE   Meds ordered this encounter  Medications   metoprolol tartrate (LOPRESSOR) 50 MG tablet    Sig: Take 1 tablet (50 mg total) by mouth once for 1 dose.    Dispense:  1 tablet    Refill:  0   rosuvastatin (CRESTOR) 10 MG tablet    Sig: Take 1 tablet (10 mg total) by mouth daily.    Dispense:  90 tablet    Refill:  3    Patient Instructions  Medication Instructions:  Continue all current medications Start Rouvastatin 10mg  daily Stop Pravastatin daily *If you need a refill on your cardiac medications before your next appointment, please call your pharmacy*   Lab Work: BMET If you have  labs (blood work) drawn today and your tests are completely normal, you will receive your results only by: MyChart Message (if you have MyChart) OR A paper copy in the mail If you have any lab test that is abnormal or we need to change your treatment, we will call you to review the results.   Testing/Procedures: echocardiogram    Follow-Up: At [facility], you and your health needs are our priority.  As part of our continuing mission to provide you with exceptional heart care, we have created designated Provider Care Teams.  These Care Teams include your primary Cardiologist (physician) and Advanced Practice Providers (APPs -  Physician Assistants and Nurse Practitioners) who all work together to provide you with the care you need, when you need it.  We recommend signing up for the patient portal called "MyChart".  Sign up information is provided on this After Visit Summary.  MyChart is used to connect with patients for Virtual Visits (Telemedicine).  Patients are able to view lab/test results, encounter notes, upcoming appointments, etc.  Non-urgent messages can be sent to your provider as well.   To learn more about what you can do with MyChart, go to [email or web address].    Your next appointment:   4 month(s)  Provider:   Dr. Bjorn Pippin  Other Instructions   Your cardiac CT will be scheduled at one of the below locations:   [facility] [address] [place], [place] [address] [phone]   If  scheduled at [facility], please arrive at the [facility] and Children's Entrance (Entrance C2) of [facility] 30 minutes prior to test start time. You can use the FREE valet parking offered at entrance C (encouraged to control the heart rate for the test)  Proceed to the [facility] Radiology Department (first floor) to check-in and test prep.  All radiology patients and guests should use entrance C2 at [facility], accessed from [facility], even though the hospital's physical address listed is [address].     Please follow these instructions carefully (unless otherwise directed):   On the Night Before the Test: Be sure to Drink plenty of water. Do not consume any caffeinated/decaffeinated beverages or chocolate 12 hours prior to your test. Do not take any antihistamines 12 hours prior to your test.  On the Day of the Test: Drink plenty of water until 1 hour prior to the test. Do not eat any food 1 hour prior to test. You may take your regular medications prior to the test.  Take metoprolol (Lopressor)  50MG  two hours prior to test. If you take Furosemide/Hydrochlorothiazide/Spironolactone, please HOLD on the morning of the test. FEMALES- please wear underwire-free bra if available, avoid dresses & tight clothing  Drink plenty of water. After receiving IV contrast, you may experience a mild flushed feeling. This is normal. On occasion, you may experience a mild rash up to 24 hours after the test. This is not dangerous. If this occurs, you can take Benadryl 25 mg and increase your fluid intake. If you experience trouble breathing, this can be serious. If it is severe call 911 IMMEDIATELY. If it is mild, please call our office. If you take any of these medications: Glipizide/Metformin, Avandament, Glucavance, please do not take 48 hours after completing test unless otherwise instructed.  We will call to schedule your test 2-4 weeks out understanding that some insurance companies will need an authorization prior to the service being performed.   For more information and frequently asked questions, please visit our website : [email or web address]  For non-scheduling related questions, please contact the cardiac imaging nurse navigator should you have any questions/concerns: Cardiac Imaging Nurse Navigators Direct Office Dial: [phone]   For scheduling needs, including  cancellations and rescheduling, please call [place], [phone].    Signed, Little Ishikawa, MD  01/04/2023 5:08 PM    [facility]

## 2023-01-04 NOTE — Progress Notes (Signed)
Danielle Bruce [id number] 02/02/1961 62 y.o.   Subjective:   Patient ID:  Danielle Bruce is a 62 y.o. (DOB 02-13-1961) female.  Chief Complaint:  Chief Complaint  Patient presents with   Follow-up    Mood and anxiety and meds   ADD    HPI Danielle Bruce presents for follow-up of multiple dxes.  visit in April and taken off lamotrigine DT possible skin reaction.  Had appt with [facility] dermatology and they assume that skin reaction with itching was related to lamotrigine. 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 Per oncology most likely drug is lamotrigine. And she weaned off of the medication to determine if the cutaneous lesions are related. Lesions seem less without lamotrigine.  At this point skin reaction is presumed related to lamotrigine.  No other disease sx. She is well aware that she has been on lamotrigine for many years with good results for depression control. There is a risk of relapse as she comes off of the medication and she is worried about that. Smart phone helps her cognitive  And other productivity.    seen October 2020.  Restarted low dosage ADD bc concerns over STM, concentration and productivity.  Ritalin 5 mg twice daily.  Also suggested she retry N-acetylcysteine for mild cognitive complaints.   seen April 07, 2019 the following was noted: Mostly good.  Anxiety is mostly better, but depression is a little worse seasonally.  Not able to go help her daughter.   Low energy but does have motivation.  Ritalin helps energy and focus but doesn't take it regularly bc irregular sleep cycle.   Ativan only when having medical issues.    Therapy working on trauma issues is very hard.  Struggling with the fact she doesn't have memory of large parts of her past.   No manic sx off mood stabilizers.  Covid isolation reduce stressors overall and not  prominently depressed.   No meds were changed.  June 25, 2019 appointment the following is noted: F real worried about Covid despite vaccination.   Started fluvoxamine 25 on May 14, 2019 from PCP mainly for inflammation but possibly thinking she might be depressed. Seen benefit for energy and sleep pattern and more appropriate feelings and less numb.  Now realizes she had been depressed for awhile but just numb with depression.  Anniversaries of deaths of friends including last BF [name] a little over a year ago.   Thinks of him daily. His 60 yo D died of unknown cause recently.  Stress D moved to beach. Wonders about increasing the fluvoxamine  [name] living in [place] and hasn't seen him in a year. [name] on disability for depression. Patient reports more depression.  Plan: Realized lately she's depressed and started Luvox again for it and for the anti-inflammatory potential for her health.  Has seen some benefit.   Prior benefit at 25 mg daily.  Optiion increase but slightly bc med sensitive. AGREE increase fluvoxamine to 37.5 mg daily.  08/06/2019 appointment with the following noted: Increased fluvoxamine to 37.5 mg and it helped the depression. Could see a big difference and had been more depressed than she even realized. Outlook better and sleep pattern normalized.    More energy and motivation.   But also started having prolonged HA.  HA lasted a couple of weeks.   Assumed HA was DT increased fluvoxamine so reduced to 25  mg on 07/28/19.  07/29/19 Episode of confusion occurred also.  Went to doctor while confused.  They couldn't determine cause.    Resolved in 1 day. FM flare up since fall/winter. To beach with daughter 3 mos minimum to give her shots for IVF treatment. Patient reports panic recently and recent difficulty with anxiety.    Occ flashbacks.   Patient denies difficulty with sleep initiation or maintenance. Denies appetite disturbance.  Patient reports that energy and motivation  have been good.  Patient denies any difficulty with concentration.  Patient denies any suicidal ideation. Plan: AGREE increase fluvoxamine to 37.5 mg daily once HA is controlled for several weekcs.  01/01/20 appt with the following noted: Increased fluvoxamine for awhile but got more HA and reduced it. Anxiety is better in general now.  But is interested in increasing it DT coming winter. When anxiety is not good it catches her off guard. Likely had Covid in August but early test was negative.  Got prednisone but sick for 5 weeks.  Lost taste and smell still going on.  D also had Covid.  Felt really sick and terrified with bad mental health at the time.  Was traumatic for her.  Had been vaccinated.   Had panic attack at the ER and took a long time to get the Ativan.  D moving back to area and pt will have to help with childcare so hopes to more aggressively treat her health problems. 2 gkids and 2 more coming. [name] married without kids yet. Plan: started Luvox again for it and for the anti-inflammatory potential for her health.  Has seen some benefit.   Prior benefit at 25 mg daily.  Optiion increase but slightly bc med sensitive. AGREE increase fluvoxamine to 37.5 mg daily once HA is controlled for several weeks.  03/03/2020 appointment with the following noted: On Day 10 of Covid.  2nd bout. A lot easier this time. Had gotten Luvox up to 50 mg daily and tolerated.  Then doubled it to 100 mg daily. Wonders about the proper dose for maintenance.   No taste and smell, exhausted, congestion and mild cough.  Energy is better than it was in the beginning.  Made her depressed and anxious again and that part is better also.  Last time had to go to ER with Covid.  Only ER visit in 2-3 years.  Less migraine than in the past. Overall was doing better with last couple months.  Trying to do anti-inflammatory diet.  Vegetarian, no dairy or gluten.  Natural sugars.   Still some anxiety. D moved back here and  pregnant with 2nd set of twins. Current twins are 60 yo.   Will restart trauma therapy and it's helped over the last couple of years.  More freedom from things.      Less daily dread with anxiety but still some panic which can be triggered.  Less OCD with fluvoxamine unless triggered.   Taking Ritalin 10 mg each AM and tolerating and benefiting. occ NM with few dreams overall. Plan: started Luvox again for it and for the anti-inflammatory potential for her health.  Has seen some benefit.   Prior benefit at 25 mg daily.  Med sensitive. AGREE increase fluvoxamine to  50  mg daily. Up to 100 mg daily if desired.   04/22/19 appt with following noted: Mostly good and randomly psycho.  Really busy with Judeth Cornfield [redacted] weeks pregnant with twins and twins 60 yo at home.  She's helping and her  H is away. Mo older and slower.  Pt overworking.  When with Judeth Cornfield kids interfere with sleep.  Doesn't eat as well with Judeth Cornfield affects her FM and energy.   Recently exhausted and needed rest but couldn't go home DT D had to go to hospital.  Her H critical at times and she lost it emotionally over this. Almost suicidal reaction with weird intrusive thoughts in response to the criticism.   Got a break and it helped.   Rheum adjusted Lyrica to BID. Had accident fall with one of babies and was bleeding.  She became hysterical.   Upset now that she couldn't control her emotions at the time.  Couldn't get herself under control to deal with the crisis.    Overall doing ok but doesn't handle stressors well and lose it.   Plan: AGREE with increase fluvoxamine to  50  mg daily. Up to 100 mg daily if desired.  06/23/2020 appointment with the following noted: Increased fluvoxamine 25 AM and 50 mg PM and tolerated it.  Been on this dose awhile.  Going to [place] May 6-19 and son taking her.  Working for [employer] and been successful. Also going to [place], [place].  Excited.   Occ overwhelmed. Starting therapy with Dorathy Daft and  looking forward to it and dealing with trauma stuff. Nervous about doing the therapy and getting decompensated like what happened last time. Prednisone hellps mood and body. Not as depressed as in the past.  Sleep is reasonably good.  Appetite is normal.  Anxiety intermittent.  No suicidal thoughts Plan: No med changes  08/19/2020 appointment with the following noted: Not good.  Dog died 2 week ago and was very close to her for 16 years.  She had been a great comfort during losses and health problems.  Dog helped her through periods of SI. Exhausted. [name] in [place] and going in early July. Wonders how long this will last.  Also increased fluvoxamine and now out and having withdrawal. Disc difference between grief and depression. Chrissie Noa needs therapist who takes Medicaid. Plan: no med changes  01/10/21 appt noted: Had covid 3rd time. Still on fluvox 50, Ritalin 10 mg daily, Deplin, trazodone 50 mg HS. Helping with gkids and been sick a lot since September. Been doing so so overall.  At times is good. Son-in-law will be getting raise and D can quit work for a year or so. [name] and his wife in [place] and that's good. Parents getting old. Both parents hearing problems and M more forgetful. Parents personality different.  Less therapy lately bc schedules.  Has dealt with a lot of trauma and will resume after new year and feels it has been helpful.  04/13/21 appt noted: More problems with appetite increase for a couple of years.  Always hungry.  Wonders about changing stimulant for this.  Weight up too much. Needs to lose 15+ # DT chronic pain including hips and knees. More depression.  Seasonally.  Episodic panic and anxiety chronic. Some intermittent conflict with daughter.   Needs Ativan intermittently. Still working through trauma issues. Continues therapy.   Haven't gotten back into church.  Not wanting to go out much. Plan: Benefit  Ritalin 10 mg AM and noon for cognitive and ADD  reasons and disc risk of palpitations.  Per request ok trial Vyvanse for binge eating and ADD instead of Ritalin per PCP suggestion May also help mood and weight loss should help pain. Caution it could cause anxiety  06/06/2021 appointment with  the following noted: Did get a prescription for Vyvanse 30 mg on 04/28/2021 filled.  Tried that in place of Ritalin. Better energy and focus with Vyvanse and less binge eating and lost 7-8 #.  Helped lose prednisone weight from November. Seems to wear off in 5-6 hours.   In afternoon when it wears off just wants to sit around.  No sig SE. Thinks she is still a little depressed and not enjoying things like she should. Chronic colitis with recent flare.   To [place] in May for a couple of weeks with son and his wife. Plan: Per request ok trial Vyvanse for binge eating and ADD and increase  to 40 mg daily per PCP suggestion  08/11/2021 appointment with the following noted: Tolerating increase Vyvanse 40 AM.  No SE.   Less crash with it and longer duration. Lost 14#.  Less craving.   Still kind of depressed.  B moved back after divorce.  F mad about it.  M health problems. Home more stressful.   Went to [place]. M acting odd and F cognitive problems. Poor sleep at home bc temp is so warm for parents. OC sx a little worse but not severe. Plan: Better with Vyvanse for binge eating and ADD and increase  to 40 mg daily per PCP suggestion May also help mood and weight loss should help pain. Continue fluvoxamine 50 mg twice daily  11/16/2021 appointment noted: Helping with gkids. Son in Social worker still working out of state.  F not doing well with cognition and falls. 62yo. Handling stressors pretty well.  When irritable then takes a break. Enjoys grandkids 4 of them. D therapist and not working right now other than taking care of kids. Restarted PT which has helped in the past with pain and limitations physically. Starting therapy with Merry Lofty end of  October. sTill taking vyvanse 40 and fluvoxamine 50 mg BID. Has missed some Vyvanse bc some days sleeping late. More productive with Vyvanse 40 mg AM, but still procrastinates. Can read again better and sustain attention better but not as productive as she would like. Still a fair amount of anxiety easily triggered anxiety at times and hopes therapy will help.  Claustrophobic easily with panic triggered. Depression is OK lately.  02/16/22 appt noted: Had asthma episode and had to leave.  She was not in severe distress and was able to verbalize that she could drive herself home.  04/26/22 appt noted: Doing well.  Overall.  If gets up late then skips vyvanse and then is sleepy.   Back and forth between [facility] and her house where temp is high for mother.  B at her house and always cold.  No HA with Stephanie's.   F Apr 03, 2022 died with dementia.  Declined quickly.  Didn't have to be placed.  Has been ok.  Gkids talk about him.  M doing pretty well and grateful he passed before being placed.   Eating healthy.   Asks whether needs to continue fluvoxamine.   Still on fluvoxamine 50 BID, Vyvanse 40 AM, trazdone 50 MG Hs Mood is good. Counseling with Bambi Cottle is already helpful. Still some anxiety but getting better recognizing triggers.  Can catastrophixize and working on CBT.  Still episodes out of the blue. Energy good with Vyvanse. Sleep is ok at D's. Plan no med changes:  07/27/2022 appointment noted: Psych meds fluvoxamine 50 mg twice daily, Vyvanse 50 mg every morning, L-methylfolate 15 mg daily, lorazepam 1 mg as needed panic, Lyrica  50 mg twice daily.  Trazodone 50 mg nightly as needed insomnia. No SE.   Vyvanse helps focus, binge eating .  Was out 5 days and overate.  More productive with it.  Better socialization with it.  Lost wt on it. Dose seems right . Sick with URI. Otherwise doing well.   D moved back [place] .  Had "breakdown" with conflict with D with thoughts of  wanting to escape but came out of it.  Therapist Bambi Cottle helped her.  Less often panic but has them at times.  Therapist helping her to recognize triggers.  Dep is under control.  Not desperate. Will have flareup with FM.  But has been active with gkids. When with kids sleeps well.   Plan no changes  10/25/22 appt noted: Meds as above & flexeril 10 mg HS.  Not often with lorazepam.   Pending cataract surgery in fall. Working in therapy to try to resolve issues that affect her body.  It is making her more self aware.  Hasn't started EMDR yet, but working on CBT.   F 19-Apr-2022 died with dementia. Wants to try methylated B6 and B12 to see if it helps. If rough mental health day then needs Ativan. Doing pool therapy.   Satisfied with Vyvanse 50 AM.  Has to take by 930 AM to keep from having insomnia.  Helps energy and motivation.  Still needs to work on productivity.    Still some migraine.  Migraine's will cause neuro Sx like word finding, visual. OCD sx been worse lately.  Cleanliness concerns in public.  Carol Ada to go to gym which produces anxiety.  Not always as bad.   Overall better on the whole but still bad days at times even with SI not often.   Still prefers to avoid going out, church, friends.  M's favorite child, B, has moved back home.   Creates some stress. Vyvanse solved binge eating generally.  01/04/23 appt noted: More energy with B12 shots. In a bit of FM flare up.   In general doing well mood wise.   Not taking ADD med bc weird CP .  Going to card today to be checked out.  Vyvanse 50 is messing with sleep.  Wants to reduce to 40 mg AM Cut back fluvoxamine to 50 mg without problems.  Maybe energy is better Ashby Dawes is good for her.   Usually sleeps with trazodone 50 and melatonin 3 mg .   Had panic at eye doctor over eye concerns.    Past psych meds: Lithium, carbamazepine, oxcarbazepine, topiramate, valproic acid, lamotrigine, gabapentin  Zyprexa, Seroquel, Latuda  headaches, Geodon,  Risperidone, Stelazine,  Rexulti,  Wellbutrin with side effects,  sertraline irritability, duloxetine,  fluvoxamine 100, fluoxetine, Stopped desipramine DT skin issues which now resolved.  It helped GI px.  buspirone side effects,   N-acetylcysteine,  pramipexole,  methylphenidate,  Adderall,  Vyvanse 50 no SE, helps Ambien with amnesia, trazodone, Ativan  Review of Systems:  Review of Systems  HENT:  Negative for congestion.   Gastrointestinal:  Positive for abdominal pain. Negative for nausea.  Musculoskeletal:  Positive for back pain and myalgias.  Neurological:  Positive for headaches. Negative for tremors.  Psychiatric/Behavioral:  Negative for behavioral problems and dysphoric mood. The patient is nervous/anxious.   Less HA with new meds.  Medications: I have reviewed the patient's current medications.  Current Outpatient Medications  Medication Sig Dispense Refill   albuterol (PROVENTIL HFA;VENTOLIN HFA) 108 (90 Base) MCG/ACT  inhaler Inhale 2 puffs into the lungs every 6 (six) hours as needed for wheezing or shortness of breath.     azelastine (ASTELIN) 0.1 % nasal spray Place 1-2 sprays into both nostrils 2 (two) times daily as needed (nasal drainage). Use in each nostril as directed 30 mL 3   B-12, Methylcobalamin, 1000 MCG SUBL Place 1,000 mcg under the tongue every 30 (thirty) days. 30 tablet 1   Calcium Carb-Cholecalciferol (CALCIUM 1000 + D) 1000-20 MG-MCG TABS Take by mouth.     cetirizine (ZYRTEC) 10 MG tablet Take 1 tablet (10 mg total) by mouth daily. 30 tablet 11   cyclobenzaprine (FLEXERIL) 10 MG tablet Take 10 mg by mouth at bedtime as needed.     diphenhydrAMINE (BENADRYL) 25 mg capsule Take 50 mg by mouth every 6 (six) hours as needed (HEADACHES).     EPINEPHrine (EPIPEN 2-PAK) 0.3 mg/0.3 mL IJ SOAJ injection Inject 0.3 mg into the muscle as needed for anaphylaxis. 0.3 mL 1   Erenumab-aooe 70 MG/ML SOAJ Inject into the skin every 28  (twenty-eight) days.     fluticasone (FLONASE) 50 MCG/ACT nasal spray Place 1-2 sprays into both nostrils daily as needed (nasal congestion). 16 g 3   fluvoxaMINE (LUVOX) 50 MG tablet TAKE 1 TABLET(50 MG) BY MOUTH TWICE DAILY 180 tablet 1   hydrocortisone (ANUSOL-HC) 25 MG suppository 1 suppository     ibuprofen (ADVIL) 800 MG tablet 1 tablet with food or milk as needed     L-Methylfolate 15 MG TABS TAKE 1 TABLET BY MOUTH DAILY 30 tablet 11   LORazepam (ATIVAN) 1 MG tablet Take 1 tablet (1 mg total) by mouth every 8 (eight) hours as needed for anxiety. (takes 2 mg when having a medical procedure.) 30 tablet 1   NURTEC 75 MG TBDP Take 1 tablet by mouth daily as needed.     Olopatadine HCl 0.2 % SOLN Apply 1 drop to eye daily as needed (itchy/watery eyes). 2.5 mL 5   oxyCODONE-acetaminophen (PERCOCET/ROXICET) 5-325 MG per tablet Take 2 tablets by mouth daily as needed for moderate pain or severe pain (mighraine.).      polyethylene glycol (MIRALAX / GLYCOLAX) packet Take 17 g by mouth daily as needed for mild constipation.     pravastatin (PRAVACHOL) 40 MG tablet 1 tablet     pregabalin (LYRICA) 50 MG capsule Take 100 mg by mouth 3 (three) times daily.     PROLIA 60 MG/ML SOSY injection BRING TO THE OFFICE FOR INJECTION ON 02/15/2018 AS DIRECTED ONCE EVERY 6 MONTHS     promethazine (PHENERGAN) 25 MG tablet Take 25 mg by mouth every 6 (six) hours as needed for nausea.     traZODone (DESYREL) 50 MG tablet TAKE 1 TABLET(50 MG) BY MOUTH AT BEDTIME 90 tablet 1   VASCEPA 1 g CAPS TK 2 CS PO BID WC  3   VITAMIN D PO Take by mouth. Take 5000mg  daily     VOLTAREN 1 % GEL Apply 2 g topically 4 (four) times daily as needed (JOINT PAIN).   3   Wheat Dextrin (BENEFIBER PO) 1 Tablespoon     levocetirizine (XYZAL) 5 MG tablet Take 5 mg by mouth every evening.  5   lisdexamfetamine (VYVANSE) 40 MG capsule Take 1 capsule (40 mg total) by mouth daily. 30 capsule 0   No current facility-administered medications  for this visit.    Medication Side Effects: None  Allergies:  Allergies  Allergen Reactions   Sulfa Antibiotics  Nausea And Vomiting    Causes pt to vomit blood Other reaction(s): vomiting blood   Gluten Meal Other (See Comments)    Sensitivity.    Lactose Intolerance (Gi) Nausea And Vomiting   Aspirin     Other reaction(s): colitis   Bromfed Dm [Pseudoeph-Bromphen-Dm]     Other reaction(s): strung out and hallucinating   Chlorpromazine     Other reaction(s): Dark thoughts   Desipramine     Other reaction(s): rash   Gabapentin     Other reaction(s): swelling / fuzzy vision   Hydrocodone     Other reaction(s): causes hangovers   Lamotrigine     Other reaction(s): rash   Metronidazole     Other reaction(s): vomiting blood   Morphine Sulfate     Other reaction(s): tearful   Nsaids     Other reaction(s): colitis   Flagyl [Metronidazole Hcl] Nausea And Vomiting   Morphine And Codeine Other (See Comments)    Reaction: Depression, emotional    Past Medical History:  Diagnosis Date   Abdominal pain, unspecified site 10/18/2012   Anemia    Anxiety    Arthritis    osteoarthritis   ASCUS (atypical squamous cells of undetermined significance) on Pap smear 05/06/2005   NEG HIGH RISK HPV--C&B BIOPSY BENIGN 12/2005   Asthma    Bipolar 2 disorder (HCC)    Cancer (HCC)    skin cancer - basal cell   Colon polyps    Complication of anesthesia    anxious afterwards, will get headaches    Constipation    Depression    Fibromyalgia 10/2013   GERD (gastroesophageal reflux disease)    Hearing loss on left    Heart murmur    never had any problems   Hemorrhoids    High cholesterol    High risk HPV infection 08/2011   cytology negative   IBS (irritable bowel syndrome)    Insomnia    Lymphocytic colitis    Lymphoma (HCC)    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   Bone marrow biopsy showes 8% plasma cells IgA Lambda   Migraines    Nausea alone  10/18/2012   Osteoarthritis    Osteopenia    Peripheral neuropathy    PTSD (post-traumatic stress disorder)     Family History  Problem Relation Age of Onset   Allergic rhinitis Mother    Allergic rhinitis Father    Diabetes Father    Hypertension Father    Hyperlipidemia Father    Allergic rhinitis Sister    Allergic rhinitis Brother    Allergic rhinitis Brother    Allergic rhinitis Maternal Aunt    Allergic rhinitis Maternal Uncle    Allergic rhinitis Paternal Aunt    Allergic rhinitis Paternal Uncle    Allergic rhinitis Maternal Grandmother    Allergic rhinitis Maternal Grandfather    Heart disease Maternal Grandfather    Allergic rhinitis Paternal Grandmother    Heart disease Paternal Grandmother    Allergic rhinitis Paternal Grandfather    Heart disease Paternal Grandfather    Allergic rhinitis Daughter    Depression Daughter    Anxiety disorder Daughter    Asthma Daughter    Atopy Son    Eczema Son    Allergic rhinitis Son    Depression Son    Atopy Son    Allergic rhinitis Son    Depression Son    Anxiety disorder Son    Breast cancer Neg Hx  Social History   Socioeconomic History   Marital status: Divorced    Spouse name: Not on file   Number of children: 3   Years of education: Not on file   Highest education level: Not on file  Occupational History   Not on file  Tobacco Use   Smoking status: Former    Current packs/day: 0.00    Types: Cigarettes    Quit date: 05/13/2014    Years since quitting: 8.6   Smokeless tobacco: Never   Tobacco comments:    social   Vaping Use   Vaping status: Never Used  Substance and Sexual Activity   Alcohol use: Yes    Alcohol/week: 0.0 standard drinks of alcohol    Comment: rare   Drug use: Never   Sexual activity: Not Currently    Comment: -1st intercourse 62 yo-More than 5 partners  Other Topics Concern   Not on file  Social History Narrative   Not on file   Social Determinants of Health    Financial Resource Strain: Not on file  Food Insecurity: Not on file  Transportation Needs: Not on file  Physical Activity: Not on file  Stress: Not on file  Social Connections: Unknown (05/11/2022)   [facility]   Social Network    Social Network: Not on file  Intimate Partner Violence: Unknown (05/11/2022)   [facility]   HITS    Physically Hurt: Not on file    Insult or Talk Down To: Not on file    Threaten Physical Harm: Not on file    Scream or Curse: Not on file    Past Medical History, Surgical history, Social history, and Family history were reviewed and updated as appropriate.   Please see review of systems for further details on the patient's review from today.   Objective:   Physical Exam:  LMP 12/28/2010   Physical Exam Constitutional:      General: She is not in acute distress. Musculoskeletal:        General: No deformity.  Neurological:     Mental Status: She is alert and oriented to person, place, and time.     Coordination: Coordination normal.  Psychiatric:        Attention and Perception: Attention and perception normal. She does not perceive auditory or visual hallucinations.        Mood and Affect: Mood is anxious. Mood is not depressed. Affect is not labile, angry or tearful.        Speech: Speech normal.        Behavior: Behavior normal.        Thought Content: Thought content normal. Thought content is not paranoid or delusional. Thought content does not include homicidal or suicidal ideation. Thought content does not include suicidal plan.        Cognition and Memory: Cognition and memory normal.        Judgment: Judgment normal.     Comments: Insight intact Depression  better overall Good affect.      Lab Review:     Component Value Date/Time   NA 140 10/30/2022 1702   NA 144 07/12/2015 1521   K 3.7 10/30/2022 1702   K 3.8 07/12/2015 1521   CL 103 10/30/2022 1702   CO2 28  10/30/2022 1702   CO2 27 07/12/2015 1521   GLUCOSE 98 10/30/2022 1702   GLUCOSE 82 07/12/2015 1521   BUN 14 10/30/2022 1702  BUN 10.3 07/12/2015 1521   CREATININE 0.71 10/30/2022 1702   CREATININE 0.87 08/01/2016 1713   CREATININE 0.8 07/12/2015 1521   CALCIUM 10.1 10/30/2022 1702   CALCIUM 9.9 07/12/2015 1521   PROT 7.4 08/01/2016 1713   PROT 7.4 07/12/2015 1521   ALBUMIN 4.8 08/01/2016 1713   ALBUMIN 4.4 07/12/2015 1521   AST 23 08/01/2016 1713   AST 22 07/12/2015 1521   ALT 26 08/01/2016 1713   ALT 31 07/12/2015 1521   ALKPHOS 55 08/01/2016 1713   ALKPHOS 54 07/12/2015 1521   BILITOT 0.7 08/01/2016 1713   BILITOT 0.40 07/12/2015 1521   GFRNONAA >60 10/30/2022 1702   GFRNONAA 76 08/01/2016 1713   GFRAA 88 08/01/2016 1713       Component Value Date/Time   WBC 5.6 10/30/2022 1702   RBC 4.65 10/30/2022 1702   HGB 14.2 10/30/2022 1702   HGB 14.1 07/12/2015 1521   HCT 41.3 10/30/2022 1702   HCT 41.5 07/12/2015 1521   PLT 247 10/30/2022 1702   PLT 248 07/12/2015 1521   MCV 88.8 10/30/2022 1702   MCV 92.2 07/12/2015 1521   MCH 30.5 10/30/2022 1702   MCHC 34.4 10/30/2022 1702   RDW 12.0 10/30/2022 1702   RDW 13.3 07/12/2015 1521   LYMPHSABS 2,006 08/01/2016 1713   LYMPHSABS 1.8 07/12/2015 1521   MONOABS 354 08/01/2016 1713   MONOABS 0.7 07/12/2015 1521   EOSABS 118 08/01/2016 1713   EOSABS 0.1 07/12/2015 1521   BASOSABS 0 08/01/2016 1713   BASOSABS 0.0 07/12/2015 1521    No results found for: "POCLITH", "LITHIUM"   No results found for: "PHENYTOIN", "PHENOBARB", "VALPROATE", "CBMZ"   Genesight:  2D6 poor,  SLC6A4 short/short MTHFR homozygous   .res Assessment: Plan:    Danielle Bruce "Elon Jester" was seen today for follow-up and add.  Diagnoses and all orders for this visit:  Major depressive disorder, recurrent episode, moderate (HCC)  PTSD (post-traumatic stress disorder)  Attention deficit hyperactivity disorder (ADHD), combined type -     lisdexamfetamine  (VYVANSE) 40 MG capsule; Take 1 capsule (40 mg total) by mouth daily.  Fibromyalgia  Obsessional thoughts  Generalized anxiety disorder  Panic disorder with agoraphobia  Insomnia due to mental condition  Other orders -     B-12, Methylcobalamin, 1000 MCG SUBL; Place 1,000 mcg under the tongue every 30 (thirty) days.   Past couple of years more obvious PTSD obvious has called into doubt the bipolar dix bc she's remained mood stable off the lamotrigine.    She has had symptoms consistent with hypomania in the past but there is now some question as to whether they were PTSD related rather than truly bipolar 2.  Her son has had treatment resistant depression with possible bipolar disorder and her daughter has been diagnosed with bipolar disorder but also a history of borderline personality disorder.  So there is a family history of the spectrum.  Has seen some benefit.   Prior benefit fluvoxamine for anxiety and mood disorders.  No SE Continue fluvoxamine 50 mg daily.  No problem reducing it.   Clear benefit from fluvoxamine for mood quite significantly. No manic sx. Consider weaning when therapy has helped a little more and further progress.  HA are better 2-4/month.  Still has them at times.    Disc sleep hygiene and temperature.     discussed the short-term risks associated with benzodiazepines including sedation and increased fall risk among others.  Discussed long-term side effect risk including dependence, potential withdrawal  symptoms, and the potential eventual dose-related risk of dementia.  But recent studies from 2020 dispute this association between benzodiazepines and dementia risk. Newer studies in 2020 do not support an association with dementia.   Keep this at a minimum given cognitive concerns.  She only uses it when triggered with anxiety usually medical.  She is not using it regularly. Continue Ativan prn 1 mg for panic and triggers.  Discussed potential benefits, risks,  and side effects of stimulants with patient to include increased heart rate, palpitations, insomnia, increased anxiety, increased irritability, or decreased appetite.  Instructed patient to contact office if experiencing any significant tolerability issues.  Better with Vyvanse for binge eating and ADD reduce to 40 mg daily bc insomnia May also help mood and weight loss should help pain.  Has been helpful.   Caution it could cause anxiety and other SE Disc pending generic.  Disc dosing options and risk of skipping higher doses will be more noticeably problematic.  Therapy helping emotional management.   trial methylated  B12.  fluvoxamine 50 mg reduced daily,  Reduce Vyvanse 40 mg every morning,  L-methylfolate 15 mg daily, lorazepam 1 mg as needed panic, Lyrica 50 mg twice daily.   Trazodone 50 mg nightly as needed insomnia.  FU 3 mos  Meredith Staggers, MD, DFAPA    Please see After Visit Summary for patient specific instructions.  Future Appointments  Date Time Provider Department Center  01/04/2023  3:40 PM Little Ishikawa, MD [facility] None  01/09/2023  3:00 PM Cottle, Bambi G, LCSW [facility] None  01/15/2023 12:30 PM Edrick Oh, PT [facility] None  01/16/2023  3:00 PM Cottle, Bambi G, LCSW [facility] None  01/23/2023  3:00 PM Cottle, Bambi G, LCSW [facility] None  01/30/2023  3:00 PM Cottle, Bambi G, LCSW [facility] None  02/05/2023 12:30 PM Edrick Oh, PT [facility] None  02/06/2023  3:00 PM Cottle, Bambi G, LCSW [facility] None  02/13/2023  3:00 PM Cottle, Bambi G, LCSW [facility] None  02/26/2023 12:30 PM Edrick Oh, PT [facility] None  02/27/2023  3:00 PM Cottle, Bambi G, LCSW [facility] None  04/30/2023  3:00 PM Ellamae Sia, DO [facility] None    No orders of the defined types were placed in this encounter.      -------------------------------

## 2023-01-04 NOTE — Patient Instructions (Signed)
Medication Instructions:  Continue all current medications Start Rouvastatin 10mg  daily Stop Pravastatin daily *If you need a refill on your cardiac medications before your next appointment, please call your pharmacy*   Lab Work: BMET If you have labs (blood work) drawn today and your tests are completely normal, you will receive your results only by: MyChart Message (if you have MyChart) OR A paper copy in the mail If you have any lab test that is abnormal or we need to change your treatment, we will call you to review the results.   Testing/Procedures: echocardiogram    Follow-Up: At [facility], you and your health needs are our priority.  As part of our continuing mission to provide you with exceptional heart care, we have created designated Provider Care Teams.  These Care Teams include your primary Cardiologist (physician) and Advanced Practice Providers (APPs -  Physician Assistants and Nurse Practitioners) who all work together to provide you with the care you need, when you need it.  We recommend signing up for the patient portal called "MyChart".  Sign up information is provided on this After Visit Summary.  MyChart is used to connect with patients for Virtual Visits (Telemedicine).  Patients are able to view lab/test results, encounter notes, upcoming appointments, etc.  Non-urgent messages can be sent to your provider as well.   To learn more about what you can do with MyChart, go to [email or web address].    Your next appointment:   4 month(s)  Provider:   Dr. Bjorn Pippin  Other Instructions   Your cardiac CT will be scheduled at one of the below locations:   [facility] [address] [place], [place] [address] [phone]   If scheduled at [facility], please arrive at the [facility] and Children's Entrance (Entrance C2) of [facility] 30 minutes prior to test start time. You can use the FREE valet parking offered at  entrance C (encouraged to control the heart rate for the test)  Proceed to the [facility] Radiology Department (first floor) to check-in and test prep.  All radiology patients and guests should use entrance C2 at [facility], accessed from [facility], even though the hospital's physical address listed is [address]     Please follow these instructions carefully (unless otherwise directed):   On the Night Before the Test: Be sure to Drink plenty of water. Do not consume any caffeinated/decaffeinated beverages or chocolate 12 hours prior to your test. Do not take any antihistamines 12 hours prior to your test.  On the Day of the Test: Drink plenty of water until 1 hour prior to the test. Do not eat any food 1 hour prior to test. You may take your regular medications prior to the test.  Take metoprolol (Lopressor)  50MG  two hours prior to test. If you take Furosemide/Hydrochlorothiazide/Spironolactone, please HOLD on the morning of the test. FEMALES- please wear underwire-free bra if available, avoid dresses & tight clothing  Drink plenty of water. After receiving IV contrast, you may experience a mild flushed feeling. This is normal. On occasion, you may experience a mild rash up to 24 hours after the test. This is not dangerous. If this occurs, you can take Benadryl 25 mg and increase your fluid intake. If you experience trouble breathing, this can be serious. If it is severe call 911 IMMEDIATELY. If it is mild, please call our office. If you take any of these medications: Glipizide/Metformin, Avandament, Glucavance, please  do not take 48 hours after completing test unless otherwise instructed.  We will call to schedule your test 2-4 weeks out understanding that some insurance companies will need an authorization prior to the service being performed.   For more information and frequently asked questions, please visit our website :  [email or web address]  For non-scheduling related questions, please contact the cardiac imaging nurse navigator should you have any questions/concerns: Cardiac Imaging Nurse Navigators Direct Office Dial: [phone]   For scheduling needs, including cancellations and rescheduling, please call [place], [phone].

## 2023-01-08 DIAGNOSIS — H33321 Round hole, right eye: Secondary | ICD-10-CM | POA: Diagnosis not present

## 2023-01-09 ENCOUNTER — Ambulatory Visit (INDEPENDENT_AMBULATORY_CARE_PROVIDER_SITE_OTHER): Payer: Medicare Other | Admitting: Psychology

## 2023-01-09 DIAGNOSIS — F422 Mixed obsessional thoughts and acts: Secondary | ICD-10-CM

## 2023-01-09 DIAGNOSIS — F902 Attention-deficit hyperactivity disorder, combined type: Secondary | ICD-10-CM

## 2023-01-09 DIAGNOSIS — F331 Major depressive disorder, recurrent, moderate: Secondary | ICD-10-CM | POA: Diagnosis not present

## 2023-01-09 DIAGNOSIS — F428 Other obsessive-compulsive disorder: Secondary | ICD-10-CM

## 2023-01-09 DIAGNOSIS — F4310 Post-traumatic stress disorder, unspecified: Secondary | ICD-10-CM

## 2023-01-09 NOTE — Progress Notes (Addendum)
[facility] Counselor/Therapist Progress Note  Patient ID: Danielle Bruce, MRN: [id number],    Date: 01/09/2023  Time Spent:60 minutes Time In:  3:00  Time out: 4:00  Treatment Type: Individual Therapy  Reported Symptoms: flashbacks, responding to triggers, depression, anxiety, being busy all of the time, dissociation  Mental Status Exam: Appearance:  Casual     Behavior: Appropriate  Motor: normal  Speech/Language:  Clear and Coherent  Affect: Blunt  Mood: Pleasant and cooperative  Thought process: normal  Thought content:   WNL  Sensory/Perceptual disturbances:   WNL  Orientation: oriented to person, place, time/date, and situation  Attention: Good  Concentration: Good  Memory: WNL  Fund of knowledge:  Good  Insight:   Good  Judgment:  Fair  Impulse Control: Good   Risk Assessment: Danger to Self:  No Self-injurious Behavior: No Danger to Others: No Duty to Warn:no Physical Aggression / Violence:No  Access to Firearms a concern: No  Gang Involvement:No   Subjective: The patient attended a face-to-face individual therapy via video visit.  The patient was in her home alone and the therapist was in the office the patient gave verbal consent for the session to be on caregility and she is aware of the limitations of telehealth.  The patient reports that she is not feeling like working on a whole lot today.  She reports that she has had a lot going on this week.  She is keeping her son's dog and one of her uncles passed away and she is not going to be able to go to the funeral.  The patient reports that she also has been physically ill over the last week.  We talked about all the things that she has going on in her life and I encouraged her to continue to take care of herself and do what she needs to do with meditation and mindfulness.  She reports that she has been working in the yard some and that helps her deal with her stress level better.  We will continue to  move towards doing EMDR when the patient is ready.  Interventions: Cognitive Behavioral Therapy, Mindfulness Meditation, Eye Movement Desensitization and Reprocessing (EMDR), Insight-Oriented, and Interpersonal, psychoeducation  Diagnosis:Major depressive disorder, recurrent episode, moderate (HCC)  PTSD (post-traumatic stress disorder)  Attention deficit hyperactivity disorder (ADHD), combined type  Obsessional thoughts  Plan: Plan of Care: Client Abilities/Strengths  Insightful, motivated, Supportive family Client Treatment Preferences  Outpatient Individual therapy/EMDR  Client Statement of Needs  "I think I have PTSD and I feel like I need another kind of therapy than talk therapy" Treatment Level  Outpatient Individual therapy  Symptoms  Demonstrates an exaggerated startle response.:  (Status: maintained). Depressed  or irritable mood.: (Status:maintained). Describes a reliving of the event,  particularly through dissociative flashbacks.: (Status: maintained). Displays a  significant decline in interest and engagement in activities.:  (Status: maintained). Displays significant psychological and/or physiological distress resulting from internal and external  clues that are reminiscent of the traumatic event.: (Status: maintained).  Experiences disturbances in sleep.:  (Status: maintained). Experiences disturbing  and persistent thoughts, images, and/or perceptions of the traumatic event.:  (Status: maintained). Experiences frequent nightmares.: (Status: maintained).  Feelings of hopelessness, worthlessness, or inappropriate guilt.: No Description Entered (Status:  improved). Has been exposed to a traumatic event involving actual or perceived threat of death or  serious injury.:  (Status: maintained). Impairment in social, occupational, or  other areas of functioning.:(Status: maintained). Intentionally avoids activities,  places, people, or  objects (e.g., up-armored vehicles)  that evoke memories of the event.: (Status: maintained). Intentionally avoids thoughts, feelings, or discussions related  to the traumatic event.: (Status: maintained). Reports difficulty concentrating as  well as feelings of guilt (Status:maintained). Reports response of intense fear,  helplessness, or horror to the traumatic event.:  (Status: maintained).  Problems Addressed  Unipolar Depression, Posttraumatic Stress Disorder (PTSD), Posttraumatic Stress Disorder (PTSD),   Posttraumatic Stress Disorder (PTSD), Posttraumatic Stress Disorder (PTSD)  Goals 1. Develop healthy thinking patterns and beliefs about self, others, and the world that lead to the alleviation and help prevent the relapse of  depression. Objective Identify and replace thoughts and beliefs that support depression. Target Date: 2024/01/04 Frequency: Weekly Progress: 50 Modality: individual Related Interventions 1. Explore and restructure underlying assumptions and beliefs reflected in biased self-talk that  may put the client at risk for relapse or recurrence. 2. Conduct Cognitive-Behavioral Therapy (see Cognitive Behavior Therapy by Reola Calkins; Overcoming Depression by Agapito Games al.), beginning with helping the client learn the connection among  cognition, depressive feelings, and actions. 2. Eliminate or reduce the negative impact trauma related symptoms have  on social, occupational, and family functioning. Objective Learn and implement personal skills to manage challenging situations related to trauma. Target Date: 2024/01/04 Frequency: Weekly Progress: 0 Modality: individual 3. No longer avoids persons, places, activities, and objects that are  reminiscent of the traumatic event. Objective Participate in Eye Movement Desensitization and Reprocessing (EMDR) to reduce emotional distress  related to traumatic thoughts, feelings, and images. Target Date: 2024/01/04 Frequency: Weekly Progress: 0 Modality:  individual   Related Interventions 1. Utilize Eye Movement Desensitization and Reprocessing (EMDR) to reduce the client's  emotional reactivity to the traumatic event and reduce PTSD symptoms. Objective Learn and implement guided self-dialogue to manage thoughts, feelings, and urges brought on by  encounters with trauma-related situations. Target Date: 2025-11/07 Frequency: Weekly Progress: 10 Modality: individual Related Interventions 1. Teach the client a guided self-dialogue procedure in which he/she learns to recognize  maladaptive self-talk, challenges its biases, copes with engendered feelings, overcomes  avoidance, and reinforces his/her accomplishments; review and reinforce progress, problemsolve obstacles. 4. No longer experiences intrusive event recollections, avoidance of event  reminders, intense arousal, or disinterest in activities or  relationships. 5. Thinks about or openly discusses the traumatic event with others  without experiencing psychological or physiological distress. Diagnosis Axis  none F43.10 (Posttraumatic stress disorder) - Open - [Signifier: n/a] Posttraumatic Stress  Disorder  Conditions For Discharge Achievement of treatment goals and objectives   Juley Giovanetti G Shakur Lembo, LCSW

## 2023-01-10 ENCOUNTER — Ambulatory Visit (INDEPENDENT_AMBULATORY_CARE_PROVIDER_SITE_OTHER): Payer: Self-pay | Admitting: *Deleted

## 2023-01-10 DIAGNOSIS — J309 Allergic rhinitis, unspecified: Secondary | ICD-10-CM | POA: Diagnosis not present

## 2023-01-10 DIAGNOSIS — R079 Chest pain, unspecified: Secondary | ICD-10-CM | POA: Diagnosis not present

## 2023-01-11 ENCOUNTER — Encounter: Payer: Self-pay | Admitting: Cardiology

## 2023-01-11 LAB — BASIC METABOLIC PANEL
BUN/Creatinine Ratio: 26 (ref 12–28)
BUN: 16 mg/dL (ref 8–27)
CO2: 24 mmol/L (ref 20–29)
Calcium: 9.4 mg/dL (ref 8.7–10.3)
Chloride: 103 mmol/L (ref 96–106)
Creatinine, Ser: 0.61 mg/dL (ref 0.57–1.00)
Glucose: 93 mg/dL (ref 70–99)
Potassium: 4.5 mmol/L (ref 3.5–5.2)
Sodium: 140 mmol/L (ref 134–144)
eGFR: 102 mL/min/{1.73_m2} (ref >59)

## 2023-01-12 ENCOUNTER — Ambulatory Visit (INDEPENDENT_AMBULATORY_CARE_PROVIDER_SITE_OTHER): Payer: Medicare Other | Admitting: *Deleted

## 2023-01-12 DIAGNOSIS — J309 Allergic rhinitis, unspecified: Secondary | ICD-10-CM | POA: Diagnosis not present

## 2023-01-12 DIAGNOSIS — R140 Abdominal distension (gaseous): Secondary | ICD-10-CM | POA: Diagnosis not present

## 2023-01-12 DIAGNOSIS — K921 Melena: Secondary | ICD-10-CM | POA: Diagnosis not present

## 2023-01-12 DIAGNOSIS — K5909 Other constipation: Secondary | ICD-10-CM | POA: Diagnosis not present

## 2023-01-15 ENCOUNTER — Ambulatory Visit: Payer: Medicare Other | Attending: Family Medicine

## 2023-01-15 DIAGNOSIS — M542 Cervicalgia: Secondary | ICD-10-CM | POA: Insufficient documentation

## 2023-01-15 DIAGNOSIS — M353 Polymyalgia rheumatica: Secondary | ICD-10-CM | POA: Diagnosis not present

## 2023-01-15 DIAGNOSIS — R252 Cramp and spasm: Secondary | ICD-10-CM | POA: Diagnosis not present

## 2023-01-15 DIAGNOSIS — M6281 Muscle weakness (generalized): Secondary | ICD-10-CM | POA: Insufficient documentation

## 2023-01-15 DIAGNOSIS — R293 Abnormal posture: Secondary | ICD-10-CM | POA: Insufficient documentation

## 2023-01-15 NOTE — Therapy (Signed)
OUTPATIENT PHYSICAL THERAPY TREATMENT   Patient Name: Danielle Bruce MRN: [id number] DOB:07/08/1960, 62 y.o., female Today's Date: 01/15/2023  PT End of Session - 01/15/23 1317     Visit Number 66    Date for PT Re-Evaluation 03/12/23    Authorization Type Medicare B- KX now    Progress Note Due on Visit 68    PT Start Time 1238    PT Stop Time 1317    PT Time Calculation (min) 39 min    Activity Tolerance Patient tolerated treatment well    Behavior During Therapy [facility] for tasks assessed/performed                        PT End of Session - 01/15/23 1317     Visit Number 66    Date for PT Re-Evaluation 03/12/23    Authorization Type Medicare B- KX now    Progress Note Due on Visit 68    PT Start Time 1238    PT Stop Time 1317    PT Time Calculation (min) 39 min    Activity Tolerance Patient tolerated treatment well    Behavior During Therapy WFL for tasks assessed/performed                                                                   Past Medical History:  Diagnosis Date   Abdominal pain, unspecified site 10/18/2012   Anemia    Anxiety    Arthritis    osteoarthritis   ASCUS (atypical squamous cells of undetermined significance) on Pap smear 05/06/2005   NEG HIGH RISK HPV--C&B BIOPSY BENIGN 12/2005   Asthma    Bipolar 2 disorder (HCC)    Cancer (HCC)    skin cancer - basal cell   Colon polyps    Complication of anesthesia    anxious afterwards, will get headaches    Constipation    Depression    Fibromyalgia 10/2013   GERD (gastroesophageal reflux disease)    Hearing loss on left    Heart murmur    never had any problems   Hemorrhoids    High cholesterol    High risk HPV infection 08/2011   cytology negative   IBS (irritable bowel syndrome)    Insomnia    Lymphocytic colitis    Lymphoma (HCC)    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   Bone marrow biopsy  showes 8% plasma cells IgA Lambda   Migraines    Nausea alone 10/18/2012   Osteoarthritis    Osteopenia    Peripheral neuropathy    PTSD (post-traumatic stress disorder)    Past Surgical History:  Procedure Laterality Date   BONE MARROW BIOPSY Left 12/18/2013   Plasma cell dyscrasia 8% population of plasma cells   BREAST BIOPSY Right    benign stereo   CESAREAN SECTION  30,86,57   CHOLECYSTECTOMY N/A 10/16/2012   Procedure: LAPAROSCOPIC CHOLECYSTECTOMY;  Surgeon: Clovis Pu. Cornett, MD;  Location: [facility];  Service: General;  Laterality: N/A;   COLONOSCOPY     numerous times   DILATION AND CURETTAGE OF UTERUS     ESOPHAGOGASTRODUODENOSCOPY     HEMORRHOID SURGERY  1993   x3   IUD REMOVAL  02/2015  Mirena   LAPAROSCOPIC LYSIS OF ADHESIONS N/A 10/16/2012   Procedure: LAPAROSCOPIC LYSIS OF ADHESIONS;  Surgeon: Maisie Fus A. Cornett, MD;  Location: [facility];  Service: General;  Laterality: N/A;   LAPAROSCOPY N/A 10/16/2012   Procedure: LAPAROSCOPY DIAGNOSTIC;  Surgeon: Clovis Pu. Cornett, MD;  Location: [facility];  Service: General;  Laterality: N/A;   PELVIC LAPAROSCOPY     RADIOLOGY WITH ANESTHESIA N/A 12/16/2015   Procedure: MRI OF BRAIN WITH AND WITHOUT CONTRAST;  Surgeon: Medication Radiologist, MD;  Location: [facility];  Service: Radiology;  Laterality: N/A;   SHOULDER SURGERY  2007/2008   SPINE SURGERY  2010   cervical   Patient Active Problem List   Diagnosis Date Noted   GAD (generalized anxiety disorder) 12/26/2017   OCD (obsessive compulsive disorder) 12/26/2017   PTSD (post-traumatic stress disorder) 12/26/2017   DDD (degenerative disc disease), cervical 07/14/2016   Primary osteoarthritis of both feet 07/14/2016   Primary osteoarthritis of both hands 07/14/2016   Other fatigue 07/14/2016   History of IBS 07/14/2016   Osteopenia of multiple sites 07/14/2016   Fibromyalgia 01/13/2016   MGUS (monoclonal gammopathy of unknown significance) 12/23/2013   Chronic cholecystitis without  calculus 10/18/2012   Abdominal pain 10/18/2012   Nausea alone 10/18/2012   Constipation 10/18/2012   Depression 10/18/2012   Anxiety    ASCUS (atypical squamous cells of undetermined significance) on Pap smear    IUD    Hemorrhoids 11/29/2010   Abdominal pain, left upper quadrant 11/29/2010    PCP:  Lupita Raider, MD  REFERRING PROVIDER: Lupita Raider, MD  REFERRING DIAG: fibromyalgia, TMJ dysfunction (added 04/20/22)  THERAPY DIAG:  Abnormal posture - Plan: PT plan of care cert/re-cert  Muscle weakness (generalized) - Plan: PT plan of care cert/re-cert  Cramp and spasm - Plan: PT plan of care cert/re-cert  Polymyalgia rheumatica (HCC) - Plan: PT plan of care cert/re-cert  Cervicalgia - Plan: PT plan of care cert/re-cert  Rationale for Evaluation and Treatment Rehabilitation  ONSET DATE: chronic pain (fibromyalgia) with flare-up 2 months ago  SUBJECTIVE:                                                                                                                                                                                                         SUBJECTIVE STATEMENT: I have had stress in my life.  I have my son's dog and have had to lift and care for him so things are more stiff and sore.  Dry needling really helps my pain. I have been active with the dog but  not as consistent with my exercises.      PERTINENT HISTORY:  Anxiety, bipolar disorder, depression, fibromyalgia, IBS, migraines, osteopenia, PTSD  PAIN:  Are you having pain: yes Pain location: low back, neck  Pain rating: 3-4/10 Pain description: irritating, annoying   Aggravating factors: doing too much Relieving factors: muscle relaxers, rest, stretching  PRECAUTIONS: Other: chronic pain syndrome and depression Other: slow progression with exercise due to chronic condition  WEIGHT BEARING RESTRICTIONS No  FALLS:  Has patient fallen in last 6 months? No  LIVING ENVIRONMENT: Lives with: lives  with their family Lives in: House/apartment  OCCUPATION: on disability  PLOF: Independent with basic ADLs and Leisure: yardwork, walking  Pt cares for her 4 young grandchildren- difficulty with care including lifting and carrying  PATIENT GOALS be more active with less pain, exercise at the gym regularly, lift and carry grandchildren and laundry, sleep with fewer interruptions, get back to more gardening.  OBJECTIVE:   DIAGNOSTIC FINDINGS: none recent   PATIENT SURVEYS:  The Patient-Specific Functional Scale  Initial:  I am going to ask you to identify up to 3 important activities that you are unable to do or are having difficulty with as a result of this problem.  Today are there any activities that you are unable to do or having difficulty with because of this?  (Patient shown scale and patient rated each activity)  Follow up: When you first came in you had difficulty performing these activities.  Today do you still have difficulty?  Patient-Specific activity scoring scheme (Point to one number):  0 1 2 3 4 5 6 7 8 9  10 Unable                                                                                                          Able to perform To perform                                                                                                    activity at the same Activity         Level as before  Injury or problem  Activity   Lifting laundry and groceries                            Initial:       2  (11/22/22)              follow up:  2 (01/15/23)  2.    Yard work- up and down with squatting          Initial:     4-5 (11/22/22)                  follow up: 7/10 (01/15/23)  3.    Drive long distances                                           Initial:   3-4 (11/22/22)                    follow up: 4/10 (01/15/23)    COGNITION: Overall cognitive status:  Within functional limits for tasks assessed  SENSATION: WFL  POSTURE: rounded shoulders and forward head  PALPATION: Diffuse palpable tenderness over bil neck, upper traps, thoracic, lumbar and gluteals with trigger points.    CERVICAL ROM:   Active ROM A/ROM 04/20/22 A/ROM 08/07/22  Flexion  60  Extension    Right lateral flexion 40 41  Left lateral flexion 45 50  Right rotation 65 70  Left rotation 65 75   (Blank rows = not tested) TMJ: opening limited by 25%, clicking with opening on the Rt.  Palpable tenderness over Rt masseter and at TMJ UPPER EXTREMITY ROM: UE A/ROM is limited by 20% into flexion and abduction with pain at end range.  Hip flexibility is limited by 25% in all directions with pain in all directions.  UPPER EXTREMITY MMT: UE: 4+/5, LE 4+/5 except hip flexors 4-/5  TODAY'S TREATMENT:  01/15/23: NuStep: Level 3 x 10 minutes for endurance and mobility-PT present to discuss progress. Ball roll outs forward and lateral x10 each  Sit to stand with 5# and upright row: 2x10  Trigger Point Dry-Needling  Treatment instructions: Expect mild to moderate muscle soreness. S/S of pneumothorax if dry needled over a lung field, and to seek immediate medical attention should they occur. Patient verbalized understanding of these instructions and education.  Patient Consent Given: Yes Education handout provided: Previously provided Muscles treated: bil cervical multifidi, upper traps, suboccipitals Treatment response/outcome: Utilized skilled palpation to identify trigger points.  During dry needling able to palpate muscle twitch and muscle elongation  Elongation and release to bil neck and upper traps after DN Skilled palpation and monitoring by PT during dry needling   12/25/22: NuStep: Level 3 x 10 minutes for endurance and mobility-PT present to discuss progress. Ball roll outs forward and lateral x10 each Melt Method on foam roll for neck- verbal cues for foam  roll placement  Hand: ball squeezes, digiflex red mass grip and individual fingers Open book with foam roll: x10 Sit to stand with 5# and upright row: 2x10  Leg press: seat at 5, 70# 2x10, 30# Rt and Lt 2x10 bil each 12/06/22: NuStep: Level 3 x 10 minutes for endurance and mobility-PT present to discuss progress. Ball roll outs forward and lateral x10 each Melt Method on foam roll for neck- PT instructed and gave  pt the website for instruction YouTube: Neck Decompress  [email or web address] Trigger Point Dry-Needling  Treatment instructions: Expect mild to moderate muscle soreness. S/S of pneumothorax if dry needled over a lung field, and to seek immediate medical attention should they occur. Patient verbalized understanding of these instructions and education.  Patient Consent Given: Yes Education handout provided: Previously provided Muscles treated: bil cervical multifidi, upper traps, suboccipitals Treatment response/outcome: Utilized skilled palpation to identify trigger points.  During dry needling able to palpate muscle twitch and muscle elongation  Elongation and release to bil neck and upper traps after DN Skilled palpation and monitoring by PT during dry needling  HOME EXERCISE PROGRAM: Access Code: [email or web address] Access Code: [email or web address] URL: [email or web address] Date: 09/06/2022 Prepared by: Tresa Endo                - Standing Hip Abduction with Counter Support  - 1 x daily - 7 x weekly - 1-2 sets - 10 reps - Standing Hip Extension with Chair  - 1 x daily - 7 x weekly - 1-2 sets - 10 reps ASSESSMENT:  CLINICAL IMPRESSION: Pt has 3 weeks between sessions and is doing well with this timeframe.  She has not been as consistent with HEP due to caring for her son's dog and lifting him.   Session focused on gentle mobility and manual therapy to address muscle tension.  Pt with improved squatting for gardening.   Pt with increased muscle tension due to stress  and lifting.  Good response to DN with improved tissue mobility and twitch response to areas treated.   Slow progress with PT due to external stressors and chronicity of pain condition in addition to other medical issues. Patient will benefit from skilled PT to address the below impairments and improve overall function.   OBJECTIVE IMPAIRMENTS decreased activity tolerance, decreased endurance, difficulty walking, decreased strength, increased muscle spasms, impaired flexibility, improper body mechanics, postural dysfunction, and pain.   ACTIVITY LIMITATIONS carrying, lifting, sitting, standing, reach over head, hygiene/grooming, and locomotion level  PARTICIPATION LIMITATIONS: meal prep, cleaning, laundry, driving, community activity, and yard work  PERSONAL FACTORS Past/current experiences, Time since onset of injury/illness/exacerbation, and 3+ comorbidities: fibromyalgia depression, anxiety,   are also affecting patient's functional outcome.   REHAB POTENTIAL: Good  CLINICAL DECISION MAKING: Evolving/moderate complexity  EVALUATION COMPLEXITY: Moderate   GOALS: Goals reviewed with patient? Yes  SHORT TERM GOALS: Target date: 12/22/22  Return to regular performance of HEP 4-5x/week to improve mobility  Baseline: met since last visit (12/06/22) Goal status: In progress  2.  Rate lifting (groceries and laundry) > or = to 3/10 on patient specific functional scale (PSFS) Baseline: 2/10 Goal status: NEW  3.  Initiate walking program for exercise > or = to 2x/wk Baseline: not walking  Goal status: NEW    LONG TERM GOALS: Target date: 03/12/23  Be independent in advanced HEP Baseline: exercising when pain and mental health allows, PT assessing every 3 weeks (11/22/22) Goal status: In progress   2.  Rate squatting for yard work > or = to 6/10 on PSFS Baseline: 7/10 (01/15/23) Goal status: MET  3.  Rate driving > or = to 8/29 on PSFS Baseline: 4/10 (01/15/23) Goal status: in  progress    4.  Walk for exercise 3-5x/wk for exercise to improve endurance  Baseline: not walking  Goal status: NEW   5.  Rate lifting (groceries and laundry) > or = to 4/10 on PSFS  Baseline: 2/10  (01/15/23) Goal status: in progress  PLAN: PT FREQUENCY: every other week  PT DURATION: 8 weeks  PLANNED INTERVENTIONS: Therapeutic exercises, Therapeutic activity, Neuromuscular re-education, Balance training, Gait training, Patient/Family education, Self Care, Joint mobilization, Aquatic Therapy, Dry Needling, Spinal manipulation, Spinal mobilization, Cryotherapy, Moist heat, Taping, Traction, Manual therapy, and Re-evaluation  PLAN FOR NEXT SESSION:  1 visit every 2-3 weeks- modify HEP, manual for pain  Lorrene Reid, PT 01/15/23 1:21 PM

## 2023-01-16 ENCOUNTER — Ambulatory Visit: Payer: Medicare Other | Admitting: Psychology

## 2023-01-16 DIAGNOSIS — F902 Attention-deficit hyperactivity disorder, combined type: Secondary | ICD-10-CM | POA: Diagnosis not present

## 2023-01-16 DIAGNOSIS — F422 Mixed obsessional thoughts and acts: Secondary | ICD-10-CM

## 2023-01-16 DIAGNOSIS — F331 Major depressive disorder, recurrent, moderate: Secondary | ICD-10-CM

## 2023-01-16 DIAGNOSIS — F4310 Post-traumatic stress disorder, unspecified: Secondary | ICD-10-CM | POA: Diagnosis not present

## 2023-01-16 DIAGNOSIS — F428 Other obsessive-compulsive disorder: Secondary | ICD-10-CM

## 2023-01-17 ENCOUNTER — Encounter (HOSPITAL_COMMUNITY): Payer: Self-pay

## 2023-01-17 DIAGNOSIS — M67442 Ganglion, left hand: Secondary | ICD-10-CM | POA: Diagnosis not present

## 2023-01-17 DIAGNOSIS — G5611 Other lesions of median nerve, right upper limb: Secondary | ICD-10-CM | POA: Diagnosis not present

## 2023-01-17 DIAGNOSIS — G5601 Carpal tunnel syndrome, right upper limb: Secondary | ICD-10-CM | POA: Diagnosis not present

## 2023-01-17 DIAGNOSIS — G5602 Carpal tunnel syndrome, left upper limb: Secondary | ICD-10-CM | POA: Diagnosis not present

## 2023-01-17 DIAGNOSIS — R2231 Localized swelling, mass and lump, right upper limb: Secondary | ICD-10-CM | POA: Diagnosis not present

## 2023-01-18 NOTE — Progress Notes (Signed)
[facility] Counselor/Therapist Progress Note  Patient ID: Danielle Bruce, MRN: [id number],    Date: 01/16/2023  Time Spent:57 minutes Time In:  3:03  Time out: 4:00  Treatment Type: Individual Therapy  Reported Symptoms: flashbacks, responding to triggers, depression, anxiety, being busy all of the time, dissociation  Mental Status Exam: Appearance:  Casual     Behavior: Appropriate  Motor: normal  Speech/Language:  Clear and Coherent  Affect: Blunt  Mood: Pleasant and cooperative  Thought process: normal  Thought content:   WNL  Sensory/Perceptual disturbances:   WNL  Orientation: oriented to person, place, time/date, and situation  Attention: Good  Concentration: Good  Memory: WNL  Fund of knowledge:  Good  Insight:   Good  Judgment:  Fair  Impulse Control: Good   Risk Assessment: Danger to Self:  No Self-injurious Behavior: No Danger to Others: No Duty to Warn:no Physical Aggression / Violence:No  Access to Firearms a concern: No  Gang Involvement:No   Subjective: The patient attended a face-to-face individual therapy in the office today.  The patient reports that she has not been feeling particularly well.  She talked about having to go to the doctor and dealing with the things that are going on at home.  The patient seems to be going somewhat on dealing with the trauma issues and we will he will not be able to move forward until she can get herself to the place where she feels comfortable moving forward with that.  We did some cognitive behavioral therapy today to help her look at things differently that are going on in her life.  I will continue to approach the idea of moving forward with the EMDR.   Interventions: Cognitive Behavioral Therapy, Mindfulness Meditation, Eye Movement Desensitization and Reprocessing (EMDR), Insight-Oriented, and Interpersonal, psychoeducation  Diagnosis:Major depressive disorder, recurrent episode, moderate  (HCC)  PTSD (post-traumatic stress disorder)  Attention deficit hyperactivity disorder (ADHD), combined type  Obsessional thoughts  Plan: Plan of Care: Client Abilities/Strengths  Insightful, motivated, Supportive family Client Treatment Preferences  Outpatient Individual therapy/EMDR  Client Statement of Needs  "I think I have PTSD and I feel like I need another kind of therapy than talk therapy" Treatment Level  Outpatient Individual therapy  Symptoms  Demonstrates an exaggerated startle response.:  (Status: maintained). Depressed  or irritable mood.: (Status:maintained). Describes a reliving of the event,  particularly through dissociative flashbacks.: (Status: maintained). Displays a  significant decline in interest and engagement in activities.:  (Status: maintained). Displays significant psychological and/or physiological distress resulting from internal and external  clues that are reminiscent of the traumatic event.: (Status: maintained).  Experiences disturbances in sleep.:  (Status: maintained). Experiences disturbing  and persistent thoughts, images, and/or perceptions of the traumatic event.:  (Status: maintained). Experiences frequent nightmares.: (Status: maintained).  Feelings of hopelessness, worthlessness, or inappropriate guilt.: No Description Entered (Status:  improved). Has been exposed to a traumatic event involving actual or perceived threat of death or  serious injury.:  (Status: maintained). Impairment in social, occupational, or  other areas of functioning.:(Status: maintained). Intentionally avoids activities,  places, people, or objects (e.g., up-armored vehicles) that evoke memories of the event.: (Status: maintained). Intentionally avoids thoughts, feelings, or discussions related  to the traumatic event.: (Status: maintained). Reports difficulty concentrating as  well as feelings of guilt (Status:maintained). Reports response of intense fear,   helplessness, or horror to the traumatic event.:  (Status: maintained).  Problems Addressed  Unipolar Depression, Posttraumatic Stress Disorder (PTSD), Posttraumatic Stress  Disorder (PTSD),   Posttraumatic Stress Disorder (PTSD), Posttraumatic Stress Disorder (PTSD)  Goals 1. Develop healthy thinking patterns and beliefs about self, others, and the world that lead to the alleviation and help prevent the relapse of  depression. Objective Identify and replace thoughts and beliefs that support depression. Target Date: 2024/01/04 Frequency: Weekly Progress: 50 Modality: individual Related Interventions 1. Explore and restructure underlying assumptions and beliefs reflected in biased self-talk that  may put the client at risk for relapse or recurrence. 2. Conduct Cognitive-Behavioral Therapy (see Cognitive Behavior Therapy by Reola Calkins; Overcoming Depression by Agapito Games al.), beginning with helping the client learn the connection among  cognition, depressive feelings, and actions. 2. Eliminate or reduce the negative impact trauma related symptoms have  on social, occupational, and family functioning. Objective Learn and implement personal skills to manage challenging situations related to trauma. Target Date: 2024/01/04 Frequency: Weekly Progress: 0 Modality: individual 3. No longer avoids persons, places, activities, and objects that are  reminiscent of the traumatic event. Objective Participate in Eye Movement Desensitization and Reprocessing (EMDR) to reduce emotional distress  related to traumatic thoughts, feelings, and images. Target Date: 2024/01/04 Frequency: Weekly Progress: 0 Modality: individual   Related Interventions 1. Utilize Eye Movement Desensitization and Reprocessing (EMDR) to reduce the client's  emotional reactivity to the traumatic event and reduce PTSD symptoms. Objective Learn and implement guided self-dialogue to manage thoughts, feelings, and urges brought on  by  encounters with trauma-related situations. Target Date: 2025-11/07 Frequency: Weekly Progress: 10 Modality: individual Related Interventions 1. Teach the client a guided self-dialogue procedure in which he/she learns to recognize  maladaptive self-talk, challenges its biases, copes with engendered feelings, overcomes  avoidance, and reinforces his/her accomplishments; review and reinforce progress, problemsolve obstacles. 4. No longer experiences intrusive event recollections, avoidance of event  reminders, intense arousal, or disinterest in activities or  relationships. 5. Thinks about or openly discusses the traumatic event with others  without experiencing psychological or physiological distress. Diagnosis Axis  none F43.10 (Posttraumatic stress disorder) - Open - [Signifier: n/a] Posttraumatic Stress  Disorder  Conditions For Discharge Achievement of treatment goals and objectives   Skyylar Kopf G Ludmilla Mcgillis, LCSW

## 2023-01-19 ENCOUNTER — Ambulatory Visit (HOSPITAL_COMMUNITY)
Admission: RE | Admit: 2023-01-19 | Discharge: 2023-01-19 | Disposition: A | Discharge status: Home / Self Care | Payer: Medicare Other | Source: Ambulatory Visit | Attending: Cardiology | Admitting: Cardiology

## 2023-01-19 DIAGNOSIS — R0609 Other forms of dyspnea: Secondary | ICD-10-CM | POA: Insufficient documentation

## 2023-01-19 DIAGNOSIS — R079 Chest pain, unspecified: Secondary | ICD-10-CM | POA: Diagnosis not present

## 2023-01-19 DIAGNOSIS — R072 Precordial pain: Secondary | ICD-10-CM | POA: Diagnosis not present

## 2023-01-19 DIAGNOSIS — I2510 Atherosclerotic heart disease of native coronary artery without angina pectoris: Secondary | ICD-10-CM | POA: Insufficient documentation

## 2023-01-19 MED ORDER — NITROGLYCERIN 0.4 MG SL SUBL
0.8000 mg | SUBLINGUAL_TABLET | Freq: Once | SUBLINGUAL | Status: AC
  Administered 2023-01-19: 0.8 mg via SUBLINGUAL

## 2023-01-19 MED ORDER — NITROGLYCERIN 0.4 MG SL SUBL
SUBLINGUAL_TABLET | SUBLINGUAL | Status: AC
  Filled 2023-01-19: qty 2

## 2023-01-19 MED ORDER — IOHEXOL 350 MG/ML SOLN
95.0000 mL | Freq: Once | INTRAVENOUS | Status: AC | PRN
  Administered 2023-01-19: 95 mL via INTRAVENOUS

## 2023-01-23 ENCOUNTER — Ambulatory Visit: Payer: Medicare Other | Admitting: Psychology

## 2023-01-30 ENCOUNTER — Ambulatory Visit: Payer: Medicare Other | Admitting: Psychology

## 2023-01-30 DIAGNOSIS — F4310 Post-traumatic stress disorder, unspecified: Secondary | ICD-10-CM | POA: Diagnosis not present

## 2023-01-30 DIAGNOSIS — F422 Mixed obsessional thoughts and acts: Secondary | ICD-10-CM | POA: Diagnosis not present

## 2023-01-30 DIAGNOSIS — F428 Other obsessive-compulsive disorder: Secondary | ICD-10-CM

## 2023-01-30 DIAGNOSIS — F331 Major depressive disorder, recurrent, moderate: Secondary | ICD-10-CM

## 2023-01-30 DIAGNOSIS — F902 Attention-deficit hyperactivity disorder, combined type: Secondary | ICD-10-CM | POA: Diagnosis not present
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
## 2023-02-05 ENCOUNTER — Ambulatory Visit: Payer: Medicare Other

## 2023-02-06 ENCOUNTER — Ambulatory Visit (INDEPENDENT_AMBULATORY_CARE_PROVIDER_SITE_OTHER): Payer: Medicare Other | Admitting: Psychology

## 2023-02-06 DIAGNOSIS — F422 Mixed obsessional thoughts and acts: Secondary | ICD-10-CM | POA: Diagnosis not present

## 2023-02-06 DIAGNOSIS — F4310 Post-traumatic stress disorder, unspecified: Secondary | ICD-10-CM

## 2023-02-06 DIAGNOSIS — F902 Attention-deficit hyperactivity disorder, combined type: Secondary | ICD-10-CM

## 2023-02-06 DIAGNOSIS — F331 Major depressive disorder, recurrent, moderate: Secondary | ICD-10-CM

## 2023-02-06 DIAGNOSIS — F428 Other obsessive-compulsive disorder: Secondary | ICD-10-CM

## 2023-02-06 NOTE — Progress Notes (Unsigned)
                Diona Peregoy G Denario Bagot, LCSW 

## 2023-02-07 ENCOUNTER — Ambulatory Visit: Payer: Medicare Other | Attending: Family Medicine

## 2023-02-07 DIAGNOSIS — R252 Cramp and spasm: Secondary | ICD-10-CM | POA: Diagnosis not present

## 2023-02-07 DIAGNOSIS — M6281 Muscle weakness (generalized): Secondary | ICD-10-CM | POA: Diagnosis not present

## 2023-02-07 DIAGNOSIS — R293 Abnormal posture: Secondary | ICD-10-CM | POA: Diagnosis not present

## 2023-02-07 NOTE — Therapy (Signed)
OUTPATIENT PHYSICAL THERAPY TREATMENT   Patient Name: Danielle Bruce MRN: [id number] DOB:1961/02/02, 62 y.o., female Today's Date: 02/07/2023  PT End of Session - 02/07/23 1450     Visit Number 67    Date for PT Re-Evaluation 03/12/23    Authorization Type Medicare B- KX now    Progress Note Due on Visit 68    PT Start Time 1447    PT Stop Time 1526    PT Time Calculation (min) 39 min    Activity Tolerance Patient tolerated treatment well    Behavior During Therapy [facility] for tasks assessed/performed                         PT End of Session - 02/07/23 1450     Visit Number [id number]    Date for PT Re-Evaluation 03/12/23    Authorization Type Medicare B- KX now    Progress Note Due on Visit 68    PT Start Time 1447    PT Stop Time 1526    PT Time Calculation (min) 39 min    Activity Tolerance Patient tolerated treatment well    Behavior During Therapy WFL for tasks assessed/performed                                                                    Past Medical History:  Diagnosis Date   Abdominal pain, unspecified site 10/18/2012   Anemia    Anxiety    Arthritis    osteoarthritis   ASCUS (atypical squamous cells of undetermined significance) on Pap smear 05/06/2005   NEG HIGH RISK HPV--C&B BIOPSY BENIGN 12/2005   Asthma    Bipolar 2 disorder (HCC)    Cancer (HCC)    skin cancer - basal cell   Colon polyps    Complication of anesthesia    anxious afterwards, will get headaches    Constipation    Depression    Fibromyalgia 10/2013   GERD (gastroesophageal reflux disease)    Hearing loss on left    Heart murmur    never had any problems   Hemorrhoids    High cholesterol    High risk HPV infection 08/2011   cytology negative   IBS (irritable bowel syndrome)    Insomnia    Lymphocytic colitis    Lymphoma (HCC)    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   Bone marrow  biopsy showes 8% plasma cells IgA Lambda   Migraines    Nausea alone 10/18/2012   Osteoarthritis    Osteopenia    Peripheral neuropathy    PTSD (post-traumatic stress disorder)    Past Surgical History:  Procedure Laterality Date   BONE MARROW BIOPSY Left 12/18/2013   Plasma cell dyscrasia 8% population of plasma cells   BREAST BIOPSY Right    benign stereo   CESAREAN SECTION  78,46,96   CHOLECYSTECTOMY N/A 10/16/2012   Procedure: LAPAROSCOPIC CHOLECYSTECTOMY;  Surgeon: Clovis Pu. Cornett, MD;  Location: [facility];  Service: General;  Laterality: N/A;   COLONOSCOPY     numerous times   DILATION AND CURETTAGE OF UTERUS     ESOPHAGOGASTRODUODENOSCOPY     HEMORRHOID SURGERY  1993   x3   IUD REMOVAL  02/2015   Mirena   LAPAROSCOPIC LYSIS OF ADHESIONS N/A 10/16/2012   Procedure: LAPAROSCOPIC LYSIS OF ADHESIONS;  Surgeon: Maisie Fus A. Cornett, MD;  Location: [facility];  Service: General;  Laterality: N/A;   LAPAROSCOPY N/A 10/16/2012   Procedure: LAPAROSCOPY DIAGNOSTIC;  Surgeon: Clovis Pu. Cornett, MD;  Location: [facility];  Service: General;  Laterality: N/A;   PELVIC LAPAROSCOPY     RADIOLOGY WITH ANESTHESIA N/A 12/16/2015   Procedure: MRI OF BRAIN WITH AND WITHOUT CONTRAST;  Surgeon: Medication Radiologist, MD;  Location: [facility];  Service: Radiology;  Laterality: N/A;   SHOULDER SURGERY  2007/2008   SPINE SURGERY  2010   cervical   Patient Active Problem List   Diagnosis Date Noted   GAD (generalized anxiety disorder) 12/26/2017   OCD (obsessive compulsive disorder) 12/26/2017   PTSD (post-traumatic stress disorder) 12/26/2017   DDD (degenerative disc disease), cervical 07/14/2016   Primary osteoarthritis of both feet 07/14/2016   Primary osteoarthritis of both hands 07/14/2016   Other fatigue 07/14/2016   History of IBS 07/14/2016   Osteopenia of multiple sites 07/14/2016   Fibromyalgia 01/13/2016   MGUS (monoclonal gammopathy of unknown significance) 12/23/2013   Chronic cholecystitis  without calculus 10/18/2012   Abdominal pain 10/18/2012   Nausea alone 10/18/2012   Constipation 10/18/2012   Depression 10/18/2012   Anxiety    ASCUS (atypical squamous cells of undetermined significance) on Pap smear    IUD    Hemorrhoids 11/29/2010   Abdominal pain, left upper quadrant 11/29/2010    PCP:  Lupita Raider, MD  REFERRING PROVIDER: Lupita Raider, MD  REFERRING DIAG: fibromyalgia, TMJ dysfunction (added 04/20/22)  THERAPY DIAG:  Abnormal posture  Muscle weakness (generalized)  Cramp and spasm  Rationale for Evaluation and Treatment Rehabilitation  ONSET DATE: chronic pain (fibromyalgia) with flare-up 2 months ago  SUBJECTIVE:                                                                                                                                                                                                         SUBJECTIVE STATEMENT:  I hurt from head to toe because of the weather.  I am achy.      PERTINENT HISTORY:  Anxiety, bipolar disorder, depression, fibromyalgia, IBS, migraines, osteopenia, PTSD  PAIN:  Are you having pain: yes Pain location: low back, neck  Pain rating: 3-4/10 Pain description: irritating, annoying   Aggravating factors: doing too much Relieving factors: muscle relaxers, rest, stretching  PRECAUTIONS: Other: chronic pain syndrome and depression Other: slow progression with exercise due to chronic  condition  WEIGHT BEARING RESTRICTIONS No  FALLS:  Has patient fallen in last 6 months? No  LIVING ENVIRONMENT: Lives with: lives with their family Lives in: House/apartment  OCCUPATION: on disability  PLOF: Independent with basic ADLs and Leisure: yardwork, walking  Pt cares for her 4 young grandchildren- difficulty with care including lifting and carrying  PATIENT GOALS be more active with less pain, exercise at the gym regularly, lift and carry grandchildren and laundry, sleep with fewer interruptions, get  back to more gardening.  OBJECTIVE:   DIAGNOSTIC FINDINGS: none recent   PATIENT SURVEYS:  The Patient-Specific Functional Scale  Initial:  I am going to ask you to identify up to 3 important activities that you are unable to do or are having difficulty with as a result of this problem.  Today are there any activities that you are unable to do or having difficulty with because of this?  (Patient shown scale and patient rated each activity)  Follow up: When you first came in you had difficulty performing these activities.  Today do you still have difficulty?  Patient-Specific activity scoring scheme (Point to one number):  0 1 2 3 4 5 6 7 8 9  10 Unable                                                                                                          Able to perform To perform                                                                                                    activity at the same Activity         Level as before                                                                                                                       Injury or problem  Activity   Lifting laundry and groceries                            Initial:       2  (11/22/22)  follow up:  2 (01/15/23)  2.    Yard work- up and down with squatting          Initial:     4-5 (11/22/22)                  follow up: 7/10 (01/15/23)  3.    Drive long distances                                           Initial:   3-4 (11/22/22)                    follow up: 4/10 (01/15/23)    COGNITION: Overall cognitive status: Within functional limits for tasks assessed  SENSATION: WFL  POSTURE: rounded shoulders and forward head  PALPATION: Diffuse palpable tenderness over bil neck, upper traps, thoracic, lumbar and gluteals with trigger points.    CERVICAL ROM:   Active ROM A/ROM 04/20/22 A/ROM 08/07/22  Flexion  60  Extension    Right lateral flexion 40 41  Left lateral flexion 45 50  Right  rotation 65 70  Left rotation 65 75   (Blank rows = not tested) TMJ: opening limited by 25%, clicking with opening on the Rt.  Palpable tenderness over Rt masseter and at TMJ UPPER EXTREMITY ROM: UE A/ROM is limited by 20% into flexion and abduction with pain at end range.  Hip flexibility is limited by 25% in all directions with pain in all directions.  UPPER EXTREMITY MMT: UE: 4+/5, LE 4+/5 except hip flexors 4-/5  TODAY'S TREATMENT:  02/07/23: NuStep: Level 3 x 10 minutes for endurance and mobility-PT present to discuss progress. Ball roll outs forward and lateral x10 each Seated on green ball: 2# 3 way raises 2x10 Melt Method: all directions Open book x10 bil each Sit to stand with 5# and upright row: 2x10   01/15/23: NuStep: Level 3 x 10 minutes for endurance and mobility-PT present to discuss progress. Ball roll outs forward and lateral x10 each  Sit to stand with 5# and upright row: 2x10  Trigger Point Dry-Needling  Treatment instructions: Expect mild to moderate muscle soreness. S/S of pneumothorax if dry needled over a lung field, and to seek immediate medical attention should they occur. Patient verbalized understanding of these instructions and education.  Patient Consent Given: Yes Education handout provided: Previously provided Muscles treated: bil cervical multifidi, upper traps, suboccipitals Treatment response/outcome: Utilized skilled palpation to identify trigger points.  During dry needling able to palpate muscle twitch and muscle elongation  Elongation and release to bil neck and upper traps after DN Skilled palpation and monitoring by PT during dry needling   12/25/22: NuStep: Level 3 x 10 minutes for endurance and mobility-PT present to discuss progress. Ball roll outs forward and lateral x10 each Melt Method on foam roll for neck- verbal cues for foam roll placement  Hand: ball squeezes, digiflex red mass grip and individual fingers Open book with foam  roll: x10 Sit to stand with 5# and upright row: 2x10  Leg press: seat at 5, 70# 2x10, 30# Rt and Lt 2x10 bil each HOME EXERCISE PROGRAM: Access Code: [email or web address] Access Code: [email or web address] URL: [email or web address] Date: 09/06/2022 Prepared by: Tresa Endo                - Standing Hip Abduction with Counter Support  - 1 x daily -  7 x weekly - 1-2 sets - 10 reps - Standing Hip Extension with Chair  - 1 x daily - 7 x weekly - 1-2 sets - 10 reps ASSESSMENT:  CLINICAL IMPRESSION: Pt has had frequent migraines and has not been able to exercise as much during this time.  She has been able to care for and play with her son's dog.  Session focused on gentle mobility and strength.   Slow progress with PT due to external stressors and chronicity of pain condition in addition to other medical issues. Patient will benefit from skilled PT to address the below impairments and improve overall function.   OBJECTIVE IMPAIRMENTS decreased activity tolerance, decreased endurance, difficulty walking, decreased strength, increased muscle spasms, impaired flexibility, improper body mechanics, postural dysfunction, and pain.   ACTIVITY LIMITATIONS carrying, lifting, sitting, standing, reach over head, hygiene/grooming, and locomotion level  PARTICIPATION LIMITATIONS: meal prep, cleaning, laundry, driving, community activity, and yard work  PERSONAL FACTORS Past/current experiences, Time since onset of injury/illness/exacerbation, and 3+ comorbidities: fibromyalgia depression, anxiety,   are also affecting patient's functional outcome.   REHAB POTENTIAL: Good  CLINICAL DECISION MAKING: Evolving/moderate complexity  EVALUATION COMPLEXITY: Moderate   GOALS: Goals reviewed with patient? Yes  SHORT TERM GOALS: Target date: 12/22/22  Return to regular performance of HEP 4-5x/week to improve mobility  Baseline: met since last visit (12/06/22) Goal status: In progress  2.  Rate lifting (groceries and  laundry) > or = to 3/10 on patient specific functional scale (PSFS) Baseline: 2/10 Goal status: NEW  3.  Initiate walking program for exercise > or = to 2x/wk Baseline: not walking  Goal status: NEW    LONG TERM GOALS: Target date: 03/12/23  Be independent in advanced HEP Baseline: exercising when pain and mental health allows, PT assessing every 3 weeks (11/22/22) Goal status: In progress   2.  Rate squatting for yard work > or = to 6/10 on PSFS Baseline: 7/10 (01/15/23) Goal status: MET  3.  Rate driving > or = to 1/88 on PSFS Baseline: 4/10 (01/15/23) Goal status: in progress    4.  Walk for exercise 3-5x/wk for exercise to improve endurance  Baseline: not walking  Goal status: NEW   5.  Rate lifting (groceries and laundry) > or = to 4/10 on PSFS  Baseline: 2/10  (01/15/23) Goal status: in progress    PLAN: PT FREQUENCY: every other week  PT DURATION: 8 weeks  PLANNED INTERVENTIONS: Therapeutic exercises, Therapeutic activity, Neuromuscular re-education, Balance training, Gait training, Patient/Family education, Self Care, Joint mobilization, Aquatic Therapy, Dry Needling, Spinal manipulation, Spinal mobilization, Cryotherapy, Moist heat, Taping, Traction, Manual therapy, and Re-evaluation  PLAN FOR NEXT SESSION:  1 visit every 2-3 weeks- modify HEP, manual for pain  Lorrene Reid, PT 02/07/23 3:32 PM

## 2023-02-12 DIAGNOSIS — H33321 Round hole, right eye: Secondary | ICD-10-CM | POA: Diagnosis not present

## 2023-02-13 ENCOUNTER — Ambulatory Visit: Payer: Medicare Other | Admitting: Psychology

## 2023-02-13 DIAGNOSIS — F331 Major depressive disorder, recurrent, moderate: Secondary | ICD-10-CM | POA: Diagnosis not present

## 2023-02-13 DIAGNOSIS — F428 Other obsessive-compulsive disorder: Secondary | ICD-10-CM

## 2023-02-13 DIAGNOSIS — F902 Attention-deficit hyperactivity disorder, combined type: Secondary | ICD-10-CM

## 2023-02-13 DIAGNOSIS — F422 Mixed obsessional thoughts and acts: Secondary | ICD-10-CM | POA: Diagnosis not present

## 2023-02-13 DIAGNOSIS — F4310 Post-traumatic stress disorder, unspecified: Secondary | ICD-10-CM | POA: Diagnosis not present

## 2023-02-14 NOTE — Progress Notes (Addendum)
[facility] Counselor/Therapist Progress Note  Patient ID: BUFFI WITKIN, MRN: [id number],    Date: 02/13/2023  Time Spent:55 minutes Time In:  3:05 Time out: 4:00  Treatment Type: Individual Therapy  Reported Symptoms: flashbacks, responding to triggers, depression, anxiety, being busy all of the time, dissociation  Mental Status Exam: Appearance:  Casual     Behavior: Appropriate  Motor: normal  Speech/Language:  Clear and Coherent  Affect: Blunt  Mood: Pleasant and cooperative  Thought process: normal  Thought content:   WNL  Sensory/Perceptual disturbances:   WNL  Orientation: oriented to person, place, time/date, and situation  Attention: Good  Concentration: Good  Memory: WNL  Fund of knowledge:  Good  Insight:   Good  Judgment:  Fair  Impulse Control: Good   Risk Assessment: Danger to Self:  No Self-injurious Behavior: No Danger to Others: No Duty to Warn:no Physical Aggression / Violence:No  Access to Firearms a concern: No  Gang Involvement:No   Subjective: The patient attended a face-to-face individual therapy in the office today.  The patient reports that she was able to go to her eye doctor and have laser surgery without any form of medications.  She states that this is because she is use the tools that I have taught her and she is thrilled that she was able to do that yesterday without an Ativan.  I explained that I felt like she was doing better than she was when she first came to see me and we are going to continue to work on getting her stabilized and where she is not quite as reactive as she has been in the past.  The patient also talked about her situation with her mother and it seems that they have difficulty because her mother perceives her as wounded in some way.  Her mother does not allow her to cook or help much and their dynamic is difficult to change.  We talked about potential ways of of working with her mother so that she possibly  could change the dynamic somewhat.   Interventions: Cognitive Behavioral Therapy, Mindfulness Meditation, Eye Movement Desensitization and Reprocessing (EMDR), Insight-Oriented, and Interpersonal, psychoeducation  Diagnosis:Major depressive disorder, recurrent episode, moderate (HCC)  PTSD (post-traumatic stress disorder)  Attention deficit hyperactivity disorder (ADHD), combined type  Obsessional thoughts  Plan: Plan of Care: Client Abilities/Strengths  Insightful, motivated, Supportive family Client Treatment Preferences  Outpatient Individual therapy/EMDR  Client Statement of Needs  "I think I have PTSD and I feel like I need another kind of therapy than talk therapy" Treatment Level  Outpatient Individual therapy  Symptoms  Demonstrates an exaggerated startle response.:  (Status: maintained). Depressed  or irritable mood.: (Status:maintained). Describes a reliving of the event,  particularly through dissociative flashbacks.: (Status: maintained). Displays a  significant decline in interest and engagement in activities.:  (Status: maintained). Displays significant psychological and/or physiological distress resulting from internal and external  clues that are reminiscent of the traumatic event.: (Status: maintained).  Experiences disturbances in sleep.:  (Status: maintained). Experiences disturbing  and persistent thoughts, images, and/or perceptions of the traumatic event.:  (Status: maintained). Experiences frequent nightmares.: (Status: maintained).  Feelings of hopelessness, worthlessness, or inappropriate guilt.: No Description Entered (Status:  improved). Has been exposed to a traumatic event involving actual or perceived threat of death or  serious injury.:  (Status: maintained). Impairment in social, occupational, or  other areas of functioning.:(Status: maintained). Intentionally avoids activities,  places, people, or objects (e.g., up-armored vehicles) that evoke  memories  of the event.: (Status: maintained). Intentionally avoids thoughts, feelings, or discussions related  to the traumatic event.: (Status: maintained). Reports difficulty concentrating as  well as feelings of guilt (Status:maintained). Reports response of intense fear,  helplessness, or horror to the traumatic event.:  (Status: maintained).  Problems Addressed  Unipolar Depression, Posttraumatic Stress Disorder (PTSD), Posttraumatic Stress Disorder (PTSD),   Posttraumatic Stress Disorder (PTSD), Posttraumatic Stress Disorder (PTSD)  Goals 1. Develop healthy thinking patterns and beliefs about self, others, and the world that lead to the alleviation and help prevent the relapse of  depression. Objective Identify and replace thoughts and beliefs that support depression. Target Date: 2024/01/04 Frequency: Weekly Progress: 60 Modality: individual Related Interventions 1. Explore and restructure underlying assumptions and beliefs reflected in biased self-talk that  may put the client at risk for relapse or recurrence. 2. Conduct Cognitive-Behavioral Therapy (see Cognitive Behavior Therapy by Reola Calkins; Overcoming Depression by Agapito Games al.), beginning with helping the client learn the connection among  cognition, depressive feelings, and actions. 2. Eliminate or reduce the negative impact trauma related symptoms have  on social, occupational, and family functioning. Objective Learn and implement personal skills to manage challenging situations related to trauma. Target Date: 2024/01/04 Frequency: Weekly Progress: 0 Modality: individual 3. No longer avoids persons, places, activities, and objects that are  reminiscent of the traumatic event. Objective Participate in Eye Movement Desensitization and Reprocessing (EMDR) to reduce emotional distress  related to traumatic thoughts, feelings, and images. Target Date: 2024/01/04 Frequency: Weekly Progress: 0 Modality: individual   Related  Interventions 1. Utilize Eye Movement Desensitization and Reprocessing (EMDR) to reduce the client's  emotional reactivity to the traumatic event and reduce PTSD symptoms. Objective Learn and implement guided self-dialogue to manage thoughts, feelings, and urges brought on by  encounters with trauma-related situations. Target Date: 2025-11/07 Frequency: Weekly Progress: 10 Modality: individual Related Interventions 1. Teach the client a guided self-dialogue procedure in which he/she learns to recognize  maladaptive self-talk, challenges its biases, copes with engendered feelings, overcomes  avoidance, and reinforces his/her accomplishments; review and reinforce progress, problemsolve obstacles. 4. No longer experiences intrusive event recollections, avoidance of event  reminders, intense arousal, or disinterest in activities or  relationships. 5. Thinks about or openly discusses the traumatic event with others  without experiencing psychological or physiological distress. Diagnosis Axis  none F43.10 (Posttraumatic stress disorder) - Open - [Signifier: n/a] Posttraumatic Stress  Disorder  Conditions For Discharge Achievement of treatment goals and objectives   Sebastyan Snodgrass G Cristabel Bicknell, LCSW

## 2023-02-15 ENCOUNTER — Other Ambulatory Visit: Payer: Self-pay | Admitting: Psychiatry

## 2023-02-15 DIAGNOSIS — F5105 Insomnia due to other mental disorder: Secondary | ICD-10-CM

## 2023-02-16 ENCOUNTER — Ambulatory Visit (HOSPITAL_COMMUNITY): Payer: Medicare Other | Attending: Cardiology

## 2023-02-16 DIAGNOSIS — R079 Chest pain, unspecified: Secondary | ICD-10-CM

## 2023-02-16 DIAGNOSIS — R9431 Abnormal electrocardiogram [ECG] [EKG]: Secondary | ICD-10-CM

## 2023-02-16 LAB — ECHOCARDIOGRAM COMPLETE
Area-P 1/2: 2.68 cm2
Calc EF: 56.6 %
S' Lateral: 2.2 cm
Single Plane A2C EF: 54.4 %
Single Plane A4C EF: 57.7 %

## 2023-02-22 DIAGNOSIS — F411 Generalized anxiety disorder: Secondary | ICD-10-CM | POA: Diagnosis not present

## 2023-02-22 DIAGNOSIS — F900 Attention-deficit hyperactivity disorder, predominantly inattentive type: Secondary | ICD-10-CM | POA: Diagnosis not present

## 2023-02-22 DIAGNOSIS — Z0000 Encounter for general adult medical examination without abnormal findings: Secondary | ICD-10-CM | POA: Diagnosis not present

## 2023-02-22 DIAGNOSIS — F3181 Bipolar II disorder: Secondary | ICD-10-CM | POA: Diagnosis not present

## 2023-02-22 DIAGNOSIS — G629 Polyneuropathy, unspecified: Secondary | ICD-10-CM | POA: Diagnosis not present

## 2023-02-22 DIAGNOSIS — F4310 Post-traumatic stress disorder, unspecified: Secondary | ICD-10-CM | POA: Diagnosis not present

## 2023-02-22 DIAGNOSIS — E7800 Pure hypercholesterolemia, unspecified: Secondary | ICD-10-CM | POA: Diagnosis not present

## 2023-02-22 DIAGNOSIS — I2510 Atherosclerotic heart disease of native coronary artery without angina pectoris: Secondary | ICD-10-CM | POA: Diagnosis not present

## 2023-02-22 DIAGNOSIS — D472 Monoclonal gammopathy: Secondary | ICD-10-CM | POA: Diagnosis not present

## 2023-02-22 DIAGNOSIS — R7301 Impaired fasting glucose: Secondary | ICD-10-CM | POA: Diagnosis not present

## 2023-02-22 DIAGNOSIS — J45909 Unspecified asthma, uncomplicated: Secondary | ICD-10-CM | POA: Diagnosis not present

## 2023-02-22 DIAGNOSIS — E538 Deficiency of other specified B group vitamins: Secondary | ICD-10-CM | POA: Diagnosis not present

## 2023-02-26 ENCOUNTER — Ambulatory Visit: Payer: Medicare Other

## 2023-02-27 ENCOUNTER — Ambulatory Visit: Payer: Medicare Other | Admitting: Psychology

## 2023-03-06 ENCOUNTER — Ambulatory Visit: Payer: Medicare Other | Admitting: Psychology

## 2023-03-06 DIAGNOSIS — F331 Major depressive disorder, recurrent, moderate: Secondary | ICD-10-CM | POA: Diagnosis not present

## 2023-03-06 DIAGNOSIS — F902 Attention-deficit hyperactivity disorder, combined type: Secondary | ICD-10-CM | POA: Diagnosis not present

## 2023-03-06 DIAGNOSIS — F4310 Post-traumatic stress disorder, unspecified: Secondary | ICD-10-CM

## 2023-03-06 NOTE — Progress Notes (Signed)
 [facility] Counselor/Therapist Progress Note  Patient ID: Danielle Bruce, MRN: [id number],    Date: 03/06/2023  Time Spent:60 minutes Time In:  3:00 Time out: 4:00  Treatment Type: Individual Therapy  Reported Symptoms: flashbacks, responding to triggers, depression, anxiety, being busy all of the time, dissociation  Mental Status Exam: Appearance:  Casual     Behavior: Appropriate  Motor: normal  Speech/Language:  Clear and Coherent  Affect: Blunt  Mood: sad  Thought process: normal  Thought content:   WNL  Sensory/Perceptual disturbances:   WNL  Orientation: oriented to person, place, time/date, and situation  Attention: Good  Concentration: Good  Memory: WNL  Fund of knowledge:  Good  Insight:   Good  Judgment:  Fair  Impulse Control: Good   Risk Assessment: Danger to Self:  No Self-injurious Behavior: No Danger to Others: No Duty to Warn:no Physical Aggression / Violence:No  Access to Firearms a concern: No  Gang Involvement:No   Subjective: The patient attended a face-to-face individual therapy in the office today.  The patient presents with a blunted affect and her mood is sad.  The patient reports that she has been feeling more depressed and sleeping more lately.  Some of this may be related to the season but I think also she has been struggling with not being content where she is living right now.  She is living with her mother and brother and transgendered daughter.  She seems to be having some difficulties with her mother mostly.  She reports that she has noticed some changes in her cognition.  I encouraged her to think about how she wants to move forward with her life and we talked about the need for her to get on some sort of schedule.  I also recommended that she try to help herself get on a sleep schedule as she is going to bed and sleeping a great deal.  Interventions: Cognitive Behavioral Therapy, Mindfulness Meditation, Eye  Movement Desensitization and Reprocessing (EMDR), Insight-Oriented, and Interpersonal, psychoeducation  Diagnosis:Major depressive disorder, recurrent episode, moderate (HCC)  PTSD (post-traumatic stress disorder)  Attention deficit hyperactivity disorder (ADHD), combined type  Plan: Plan of Care: Client Abilities/Strengths  Insightful, motivated, Supportive family Client Treatment Preferences  Outpatient Individual therapy/EMDR  Client Statement of Needs  I think I have PTSD and I feel like I need another kind of therapy than talk therapy Treatment Level  Outpatient Individual therapy  Symptoms  Demonstrates an exaggerated startle response.:  (Status: maintained). Depressed  or irritable mood.: (Status:maintained). Describes a reliving of the event,  particularly through dissociative flashbacks.: (Status: maintained). Displays a  significant decline in interest and engagement in activities.:  (Status: maintained). Displays significant psychological and/or physiological distress resulting from internal and external  clues that are reminiscent of the traumatic event.: (Status: maintained).  Experiences disturbances in sleep.:  (Status: maintained). Experiences disturbing  and persistent thoughts, images, and/or perceptions of the traumatic event.:  (Status: maintained). Experiences frequent nightmares.: (Status: maintained).  Feelings of hopelessness, worthlessness, or inappropriate guilt.: No Description Entered (Status:  improved). Has been exposed to a traumatic event involving actual or perceived threat of death or  serious injury.:  (Status: maintained). Impairment in social, occupational, or  other areas of functioning.:(Status: maintained). Intentionally avoids activities,  places, people, or objects (e.g., up-armored vehicles) that evoke memories of the event.: (Status: maintained). Intentionally avoids thoughts, feelings, or discussions related  to the traumatic event.:  (Status: maintained).  Reports difficulty concentrating as  well as feelings of guilt (Status:maintained). Reports response of intense fear,  helplessness, or horror to the traumatic event.:  (Status: maintained).  Problems Addressed  Unipolar Depression, Posttraumatic Stress Disorder (PTSD), Posttraumatic Stress Disorder (PTSD),   Posttraumatic Stress Disorder (PTSD), Posttraumatic Stress Disorder (PTSD)  Goals 1. Develop healthy thinking patterns and beliefs about self, others, and the world that lead to the alleviation and help prevent the relapse of  depression. Objective Identify and replace thoughts and beliefs that support depression. Target Date: 2024/01/04 Frequency: Weekly Progress: 60 Modality: individual Related Interventions 1. Explore and restructure underlying assumptions and beliefs reflected in biased self-talk that  may put the client at risk for relapse or recurrence. 2. Conduct Cognitive-Behavioral Therapy (see Cognitive Behavior Therapy by Almarie; Overcoming Depression by Marine dunker al.), beginning with helping the client learn the connection among  cognition, depressive feelings, and actions. 2. Eliminate or reduce the negative impact trauma related symptoms have  on social, occupational, and family functioning. Objective Learn and implement personal skills to manage challenging situations related to trauma. Target Date: 2024/01/04 Frequency: Weekly Progress: 0 Modality: individual 3. No longer avoids persons, places, activities, and objects that are  reminiscent of the traumatic event. Objective Participate in Eye Movement Desensitization and Reprocessing (EMDR) to reduce emotional distress  related to traumatic thoughts, feelings, and images. Target Date: 2024/01/04 Frequency: Weekly Progress: 0 Modality: individual   Related Interventions 1. Utilize Eye Movement Desensitization and Reprocessing (EMDR) to reduce the client's  emotional reactivity to the  traumatic event and reduce PTSD symptoms. Objective Learn and implement guided self-dialogue to manage thoughts, feelings, and urges brought on by  encounters with trauma-related situations. Target Date: 2025-11/07 Frequency: Weekly Progress: 10 Modality: individual Related Interventions 1. Teach the client a guided self-dialogue procedure in which he/she learns to recognize  maladaptive self-talk, challenges its biases, copes with engendered feelings, overcomes  avoidance, and reinforces his/her accomplishments; review and reinforce progress, problemsolve obstacles. 4. No longer experiences intrusive event recollections, avoidance of event  reminders, intense arousal, or disinterest in activities or  relationships. 5. Thinks about or openly discusses the traumatic event with others  without experiencing psychological or physiological distress. Diagnosis Axis  none F43.10 (Posttraumatic stress disorder) - Open - [Signifier: n/a] Posttraumatic Stress  Disorder  Conditions For Discharge Achievement of treatment goals and objectives   Jannelly Bergren G Shital Crayton, LCSW

## 2023-03-08 ENCOUNTER — Ambulatory Visit: Payer: Medicare Other | Attending: Family Medicine

## 2023-03-08 ENCOUNTER — Encounter: Payer: Self-pay | Admitting: Psychiatry

## 2023-03-08 ENCOUNTER — Ambulatory Visit: Payer: Medicare Other | Admitting: Psychiatry

## 2023-03-08 DIAGNOSIS — F411 Generalized anxiety disorder: Secondary | ICD-10-CM

## 2023-03-08 DIAGNOSIS — M797 Fibromyalgia: Secondary | ICD-10-CM | POA: Diagnosis not present

## 2023-03-08 DIAGNOSIS — F4310 Post-traumatic stress disorder, unspecified: Secondary | ICD-10-CM

## 2023-03-08 DIAGNOSIS — M6281 Muscle weakness (generalized): Secondary | ICD-10-CM | POA: Diagnosis not present

## 2023-03-08 DIAGNOSIS — M353 Polymyalgia rheumatica: Secondary | ICD-10-CM | POA: Insufficient documentation

## 2023-03-08 DIAGNOSIS — F5105 Insomnia due to other mental disorder: Secondary | ICD-10-CM

## 2023-03-08 DIAGNOSIS — F902 Attention-deficit hyperactivity disorder, combined type: Secondary | ICD-10-CM

## 2023-03-08 DIAGNOSIS — F4001 Agoraphobia with panic disorder: Secondary | ICD-10-CM

## 2023-03-08 DIAGNOSIS — F428 Other obsessive-compulsive disorder: Secondary | ICD-10-CM

## 2023-03-08 DIAGNOSIS — F422 Mixed obsessional thoughts and acts: Secondary | ICD-10-CM | POA: Diagnosis not present

## 2023-03-08 DIAGNOSIS — F331 Major depressive disorder, recurrent, moderate: Secondary | ICD-10-CM

## 2023-03-08 DIAGNOSIS — R7989 Other specified abnormal findings of blood chemistry: Secondary | ICD-10-CM | POA: Diagnosis not present

## 2023-03-08 DIAGNOSIS — F50811 Binge eating disorder, moderate: Secondary | ICD-10-CM | POA: Diagnosis not present

## 2023-03-08 DIAGNOSIS — M542 Cervicalgia: Secondary | ICD-10-CM | POA: Insufficient documentation

## 2023-03-08 DIAGNOSIS — R252 Cramp and spasm: Secondary | ICD-10-CM | POA: Diagnosis not present

## 2023-03-08 DIAGNOSIS — R293 Abnormal posture: Secondary | ICD-10-CM | POA: Diagnosis not present

## 2023-03-08 MED ORDER — FLUVOXAMINE MALEATE 25 MG PO TABS: 75.0000 mg | ORAL_TABLET | Freq: Every day | ORAL | 1 refills | Status: DC

## 2023-03-08 MED ORDER — AMPHETAMINE-DEXTROAMPHET ER 20 MG PO CP24: 20.0000 mg | ORAL_CAPSULE | Freq: Every day | ORAL | 0 refills | Status: DC

## 2023-03-08 NOTE — Progress Notes (Signed)
 Danielle Bruce [id number] 09/28/1960 63 y.o.   Subjective:   Patient ID:  Danielle Bruce is a 63 y.o. (DOB [birth year]) female.  Chief Complaint:  Chief Complaint  Patient presents with   Follow-up   Depression   ADD   Anxiety   Sleeping Problem    HPI Danielle Bruce presents for follow-up of multiple dxes.  visit in April and taken off lamotrigine  DT possible skin reaction.  Had appt with [facility] and they assume that skin reaction with itching was related to lamotrigine . 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 Per oncology most likely drug is lamotrigine . And Danielle Bruce weaned off of the medication to determine if the cutaneous lesions are related. Lesions seem less without lamotrigine .  At this point skin reaction is presumed related to lamotrigine .  No other disease sx. Danielle Bruce is well aware that Danielle Bruce has been on lamotrigine  for many years with good results for depression control. There is a risk of relapse as Danielle Bruce comes off of the medication and Danielle Bruce is worried about that. Smart phone helps her cognitive  And other productivity.    seen October 2020.  Restarted low dosage ADD bc concerns over STM, concentration and productivity.  Ritalin  5 mg twice daily.  Also suggested Danielle Bruce retry N-acetylcysteine for mild cognitive complaints.   seen April 07, 2019 the following was noted: Mostly good.  Anxiety is mostly better, but depression is a little worse seasonally.  Not able to go help her daughter.   Low energy but does have motivation.  Ritalin  helps energy and focus but doesn't take it regularly bc irregular sleep cycle.   Ativan  only when having medical issues.    Therapy working on trauma issues is very hard.  Struggling with the fact Danielle Bruce doesn't have memory of large parts of her past.   No manic sx off mood stabilizers.  Covid isolation reduce stressors  overall and not prominently depressed.   No meds were changed.  June 25, 2019 appointment the following is noted: F real worried about Covid despite vaccination.   Started fluvoxamine  25 on May 14, 2019 from PCP mainly for inflammation but possibly thinking Danielle Bruce might be depressed. Seen benefit for energy and sleep pattern and more appropriate feelings and less numb.  Now realizes Danielle Bruce had been depressed for awhile but just numb with depression.  Anniversaries of deaths of friends including last BF Danielle Bruce a little over a year ago.   Thinks of him daily. His 61 yo D died of unknown cause recently.  Stress D moved to beach. Wonders about increasing the fluvoxamine   Danielle Bruce living in [place] and hasn't seen him in a year. Danielle Bruce on disability for depression. Patient reports more depression.  Plan: Realized lately Danielle Bruce's depressed and started Luvox  again for it and for the anti-inflammatory potential for her health.  Has seen some benefit.   Prior benefit at 25 mg daily.  Optiion increase but slightly bc med sensitive. AGREE increase fluvoxamine  to 37.5 mg daily.  08/06/2019 appointment with the following noted: Increased fluvoxamine  to 37.5 mg and it helped the depression. Could see a big difference and had been more depressed than Danielle Bruce even realized. Outlook better and sleep pattern normalized.    More energy and motivation.   But also started having prolonged HA.  HA lasted a couple of weeks.   Assumed HA was DT increased fluvoxamine  so reduced  to 25 mg on 07/28/19.  07/29/19 Episode of confusion occurred also.  Went to doctor while confused.  They couldn't determine cause.    Resolved in 1 day. FM flare up since fall/winter. To beach with daughter 3 mos minimum to give her shots for IVF treatment. Patient reports panic recently and recent difficulty with anxiety.    Occ flashbacks.   Patient denies difficulty with sleep initiation or maintenance. Denies appetite disturbance.  Patient reports that energy  and motivation have been good.  Patient denies any difficulty with concentration.  Patient denies any suicidal ideation. Plan: AGREE increase fluvoxamine  to 37.5 mg daily once HA is controlled for several weekcs.  01/01/20 appt with the following noted: Increased fluvoxamine  for awhile but got more HA and reduced it. Anxiety is better in general now.  But is interested in increasing it DT coming winter. When anxiety is not good it catches her off guard. Likely had Covid in August but early test was negative.  Got prednisone  but sick for 5 weeks.  Lost taste and smell still going on.  D also had Covid.  Felt really sick and terrified with bad mental health at the time.  Was traumatic for her.  Had been vaccinated.   Had panic attack at the ER and took a long time to get the Ativan .  D moving back to area and pt will have to help with childcare so hopes to more aggressively treat her health problems. 2 gkids and 2 more coming. Danielle Bruce married without kids yet. Plan: started Luvox  again for it and for the anti-inflammatory potential for her health.  Has seen some benefit.   Prior benefit at 25 mg daily.  Optiion increase but slightly bc med sensitive. AGREE increase fluvoxamine  to 37.5 mg daily once HA is controlled for several weeks.  03/03/2020 appointment with the following noted: On Day 10 of Covid.  2nd bout. A lot easier this time. Had gotten Luvox  up to 50 mg daily and tolerated.  Then doubled it to 100 mg daily. Wonders about the proper dose for maintenance.   No taste and smell, exhausted, congestion and mild cough.  Energy is better than it was in the beginning.  Made her depressed and anxious again and that part is better also.  Last time had to go to ER with Covid.  Only ER visit in 2-3 years.  Less migraine than in the past. Overall was doing better with last couple months.  Trying to do anti-inflammatory diet.  Vegetarian, no dairy or gluten.  Natural sugars.   Still some anxiety. D  moved back here and pregnant with 2nd set of twins. Current twins are 61 yo.   Will restart trauma therapy and it's helped over the last couple of years.  More freedom from things.      Less daily dread with anxiety but still some panic which can be triggered.  Less OCD with fluvoxamine  unless triggered.   Taking Ritalin  10 mg each AM and tolerating and benefiting. occ NM with few dreams overall. Plan: started Luvox  again for it and for the anti-inflammatory potential for her health.  Has seen some benefit.   Prior benefit at 25 mg daily.  Med sensitive. AGREE increase fluvoxamine  to  50  mg daily. Up to 100 mg daily if desired.   04/22/19 appt with following noted: Mostly good and randomly psycho.  Really busy with [place] [redacted] weeks pregnant with twins and twins 61 yo at home.  Danielle Bruce's helping  and her H is away. Mo older and slower.  Pt overworking.  When with [place] kids interfere with sleep.  Doesn't eat as well with [place] affects her FM and energy.   Recently exhausted and needed rest but couldn't go home DT D had to go to hospital.  Her H critical at times and Danielle Bruce lost it emotionally over this. Almost suicidal reaction with weird intrusive thoughts in response to the criticism.   Got a break and it helped.   Rheum adjusted Lyrica to BID. Had accident fall with one of babies and was bleeding.  Danielle Bruce became hysterical.   Upset now that Danielle Bruce couldn't control her emotions at the time.  Couldn't get herself under control to deal with the crisis.    Overall doing ok but doesn't handle stressors well and lose it.   Plan: AGREE with increase fluvoxamine  to  50  mg daily. Up to 100 mg daily if desired.  06/23/2020 appointment with the following noted: Increased fluvoxamine  25 AM and 50 mg PM and tolerated it.  Been on this dose awhile.  Going to [place] May 6-19 and son taking her.  Working for [employer] and been successful. Also going to [place], [place].  Excited.   Occ overwhelmed. Starting therapy  with Kayla and looking forward to it and dealing with trauma stuff. Nervous about doing the therapy and getting decompensated like what happened last time. Prednisone  hellps mood and body. Not as depressed as in the past.  Sleep is reasonably good.  Appetite is normal.  Anxiety intermittent.  No suicidal thoughts Plan: No med changes  08/19/2020 appointment with the following noted: Not good.  Dog died 2 week ago and was very close to her for 16 years.  Danielle Bruce had been a great comfort during losses and health problems.  Dog helped her through periods of SI. Exhausted. Danielle Bruce in [place] and going in early July. Wonders how long this will last.  Also increased fluvoxamine  and now out and having withdrawal. Disc difference between grief and depression. Elsie needs therapist who takes Medicaid. Plan: no med changes  01/10/21 appt noted: Had covid 3rd time. Still on fluvox 50, Ritalin  10 mg daily, Deplin, trazodone  50 mg HS. Helping with gkids and been sick a lot since September. Been doing so so overall.  At times is good. Son-in-law will be getting raise and D can quit work for a year or so. Danielle Bruce and his wife in [place] and that's good. Parents getting old. Both parents hearing problems and M more forgetful. Parents personality different.  Less therapy lately bc schedules.  Has dealt with a lot of trauma and will resume after new year and feels it has been helpful.  04/13/21 appt noted: More problems with appetite increase for a couple of years.  Always hungry.  Wonders about changing stimulant for this.  Weight up too much. Needs to lose 15+ # DT chronic pain including hips and knees. More depression.  Seasonally.  Episodic panic and anxiety chronic. Some intermittent conflict with daughter.   Needs Ativan  intermittently. Still working through trauma issues. Continues therapy.   Haven't gotten back into church.  Not wanting to go out much. Plan: Benefit  Ritalin  10 mg AM and noon for  cognitive and ADD reasons and disc risk of palpitations.  Per request ok trial Vyvanse  for binge eating and ADD instead of Ritalin  per PCP suggestion May also help mood and weight loss should help pain. Caution it could cause anxiety  06/06/2021  appointment with the following noted: Did get a prescription for Vyvanse  30 mg on 04/28/2021 filled.  Tried that in place of Ritalin . Better energy and focus with Vyvanse  and less binge eating and lost 7-8 #.  Helped lose prednisone  weight from November. Seems to wear off in 5-6 hours.   In afternoon when it wears off just wants to sit around.  No sig SE. Thinks Danielle Bruce is still a little depressed and not enjoying things like Danielle Bruce should. Chronic colitis with recent flare.   To [place] in [place] for a couple of weeks with son and his wife. Plan: Per request ok trial Vyvanse  for binge eating and ADD and increase  to 40 mg daily per PCP suggestion  08/11/2021 appointment with the following noted: Tolerating increase Vyvanse  40 AM.  No SE.   Less crash with it and longer duration. Lost 14#.  Less craving.   Still kind of depressed.  B moved back after divorce.  F mad about it.  M health problems. Home more stressful.   Went to [place]. M acting odd and F cognitive problems. Poor sleep at home bc temp is so warm for parents. OC sx a little worse but not severe. Plan: Better with Vyvanse  for binge eating and ADD and increase  to 40 mg daily per PCP suggestion May also help mood and weight loss should help pain. Continue fluvoxamine  50 mg twice daily  11/16/2021 appointment noted: Helping with gkids. Son in social worker still working out of state.  F not doing well with cognition and falls. 63yo. Handling stressors pretty well.  When irritable then takes a break. Enjoys grandkids 4 of them. D therapist and not working right now other than taking care of kids. Restarted PT which has helped in the past with pain and limitations physically. Starting therapy with Peggye Macintosh end of October. sTill taking vyvanse  40 and fluvoxamine  50 mg BID. Has missed some Vyvanse  bc some days sleeping late. More productive with Vyvanse  40 mg AM, but still procrastinates. Can read again better and sustain attention better but not as productive as Danielle Bruce would like. Still a fair amount of anxiety easily triggered anxiety at times and hopes therapy will help.  Claustrophobic easily with panic triggered. Depression is OK lately.  02/16/22 appt noted: Had asthma episode and had to leave.  Danielle Bruce was not in severe distress and was able to verbalize that Danielle Bruce could drive herself home.  04/26/22 appt noted: Doing well.  Overall.  If gets up late then skips vyvanse  and then is sleepy.   Back and forth between [facility] and her house where temp is high for mother.  B at her house and always cold.  No HA with Stephanie's.   F 03-30-22 died with dementia.  Declined quickly.  Didn't have to be placed.  Has been ok.  Gkids talk about him.  M doing pretty well and grateful he passed before being placed.   Eating healthy.   Asks whether needs to continue fluvoxamine .   Still on fluvoxamine  50 BID, Vyvanse  40 AM, trazdone 50 MG Hs Mood is good. Counseling with Bambi Cottle is already helpful. Still some anxiety but getting better recognizing triggers.  Can catastrophixize and working on CBT.  Still episodes out of the blue. Energy good with Vyvanse . Sleep is ok at D's. Plan no med changes:  07/27/2022 appointment noted: Psych meds fluvoxamine  50 mg twice daily, Vyvanse  50 mg every morning, L-methylfolate 15 mg daily, lorazepam  1 mg as needed  panic, Lyrica 50 mg twice daily.  Trazodone  50 mg nightly as needed insomnia. No SE.   Vyvanse  helps focus, binge eating .  Was out 5 days and overate.  More productive with it.  Better socialization with it.  Lost wt on it. Dose seems right . Sick with URI. Otherwise doing well.   D moved back [place] .  Had breakdown with conflict with D with  thoughts of wanting to escape but came out of it.  Therapist Bambi Cottle helped her.  Less often panic but has them at times.  Therapist helping her to recognize triggers.  Dep is under control.  Not desperate. Will have flareup with FM.  But has been active with gkids. When with kids sleeps well.   Plan no changes  10/25/22 appt noted: Meds as above & flexeril 10 mg HS.  Not often with lorazepam .   Pending cataract surgery in fall. Working in therapy to try to resolve issues that affect her body.  It is making her more self aware.  Hasn't started EMDR yet, but working on CBT.   F 04-05-22 died with dementia. Wants to try methylated B6 and B12 to see if it helps. If rough mental health day then needs Ativan . Doing pool therapy.   Satisfied with Vyvanse  50 AM.  Has to take by 930 AM to keep from having insomnia.  Helps energy and motivation.  Still needs to work on productivity.    Still some migraine.  Migraine's will cause neuro Sx like word finding, visual. OCD sx been worse lately.  Cleanliness concerns in public.  Lulu to go to gym which produces anxiety.  Not always as bad.   Overall better on the whole but still bad days at times even with SI not often.   Still prefers to avoid going out, church, friends.  M's favorite child, B, has moved back home.   Creates some stress. Vyvanse  solved binge eating generally.  01/04/23 appt noted: More energy with B12 shots. In a bit of FM flare up.   In general doing well mood wise.   Not taking ADD med bc weird CP .  Going to card today to be checked out.  Vyvanse  50 is messing with sleep.  Wants to reduce to 40 mg AM Cut back fluvoxamine  to 50 mg without problems.  Maybe energy is better Lysle is good for her.   Usually sleeps with trazodone  50 and melatonin 3 mg .   Had panic at eye doctor over eye concerns.   Plan:  fluvoxamine  50 mg reduced daily,  Reduced Vyvanse  40 mg every morning DT insomnia,  L-methylfolate 15 mg daily, lorazepam   1 mg as needed panic, Lyrica 50 mg twice daily.   Trazodone  50 mg nightly as needed insomnia.  03/08/23 appt noted: Meds as above Dad died a year ago.  Feels more depressed.  Spent Xmas wit D's family and enjoyed it.  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  Was busy there.  When got home slept most of 48 hours. Last couple of days took Vyvanse .  But interfering with sleep when not active.   At home no structure and will get more negative thoughts.   When on prednisone  feels better and more productive and better mood.  Son Will transitioned to [place] and living at home. Danielle Bruce is more depressed and that drags her down.  4 people at home.     Past psych meds: Lithium, carbamazepine, oxcarbazepine, topiramate , valproic acid, lamotrigine ,  gabapentin   Zyprexa, Seroquel, Latuda headaches, Geodon,  Risperidone, Stelazine,  Rexulti,  Wellbutrin with side effects,  sertraline irritability, duloxetine,  fluvoxamine  100, fluoxetine, Stopped desipramine DT skin issues which now resolved.  It helped GI px.  buspirone side effects,   N-acetylcysteine,  pramipexole,  methylphenidate ,  Adderall,  Vyvanse  50 SE insomnia helps Ambien with amnesia, trazodone , Ativan   Review of Systems:  Review of Systems  HENT:  Negative for congestion.   Gastrointestinal:  Positive for abdominal pain. Negative for nausea.  Musculoskeletal:  Positive for back pain and myalgias.  Neurological:  Positive for headaches. Negative for tremors.  Psychiatric/Behavioral:  Positive for decreased concentration and dysphoric mood. Negative for behavioral problems. The patient is nervous/anxious.   Less HA with new meds.  Medications: I have reviewed the patient's current medications.  Current Outpatient Medications  Medication Sig Dispense Refill   albuterol  (PROVENTIL  HFA;VENTOLIN  HFA) 108 (90 Base) MCG/ACT inhaler Inhale 2 puffs into the lungs every 6 (six) hours as needed for wheezing or shortness of breath.      amphetamine -dextroamphetamine (ADDERALL XR) 20 MG 24 hr capsule Take 1 capsule (20 mg total) by mouth daily. 30 capsule 0   azelastine  (ASTELIN ) 0.1 % nasal spray Place 1-2 sprays into both nostrils 2 (two) times daily as needed (nasal drainage). Use in each nostril as directed 30 mL 3   Calcium  Carb-Cholecalciferol (CALCIUM  1000 + D) 1000-20 MG-MCG TABS Take by mouth.     cetirizine  (ZYRTEC ) 10 MG tablet Take 1 tablet (10 mg total) by mouth daily. 30 tablet 11   cyclobenzaprine (FLEXERIL) 10 MG tablet Take 10 mg by mouth at bedtime as needed.     diphenhydrAMINE  (BENADRYL ) 25 mg capsule Take 50 mg by mouth every 6 (six) hours as needed (HEADACHES).     EPINEPHrine  (EPIPEN  2-PAK) 0.3 mg/0.3 mL IJ SOAJ injection Inject 0.3 mg into the muscle as needed for anaphylaxis. 0.3 mL 1   Erenumab-aooe 70 MG/ML SOAJ Inject into the skin every 28 (twenty-eight) days.     fluticasone  (FLONASE ) 50 MCG/ACT nasal spray Place 1-2 sprays into both nostrils daily as needed (nasal congestion). 16 g 3   hydrocortisone (ANUSOL-HC) 25 MG suppository 1 suppository     ibuprofen  (ADVIL ) 800 MG tablet 1 tablet with food or milk as needed     L-Methylfolate 15 MG TABS TAKE 1 TABLET BY MOUTH DAILY 30 tablet 11   LORazepam  (ATIVAN ) 1 MG tablet Take 1 tablet (1 mg total) by mouth every 8 (eight) hours as needed for anxiety. (takes 2 mg when having a medical procedure.) 30 tablet 1   NURTEC 75 MG TBDP Take 1 tablet by mouth daily as needed.     Olopatadine  HCl 0.2 % SOLN Apply 1 drop to eye daily as needed (itchy/watery eyes). 2.5 mL 5   oxyCODONE -acetaminophen  (PERCOCET/ROXICET) 5-325 MG per tablet Take 2 tablets by mouth daily as needed for moderate pain or severe pain (mighraine.).      polyethylene glycol (MIRALAX  / GLYCOLAX ) packet Take 17 g by mouth daily as needed for mild constipation.     pregabalin (LYRICA) 50 MG capsule Take 100 mg by mouth 3 (three) times daily.     PROLIA 60 MG/ML SOSY injection BRING TO THE OFFICE  FOR INJECTION ON 02/15/2018 AS DIRECTED ONCE EVERY 6 MONTHS     promethazine  (PHENERGAN ) 25 MG tablet Take 25 mg by mouth every 6 (six) hours as needed for nausea.     rosuvastatin  (CRESTOR ) 10 MG tablet  Take 1 tablet (10 mg total) by mouth daily. 90 tablet 3   traZODone  (DESYREL ) 50 MG tablet TAKE 1 TABLET(50 MG) BY MOUTH AT BEDTIME 30 tablet 0   VASCEPA 1 g CAPS TK 2 CS PO BID WC  3   VITAMIN D  PO Take by mouth. Take 5000mg  daily     VOLTAREN 1 % GEL Apply 2 g topically 4 (four) times daily as needed (JOINT PAIN).   3   Wheat Dextrin (BENEFIBER PO) 1 Tablespoon     B-12, Methylcobalamin , 1000 MCG SUBL Place 1,000 mcg under the tongue every 30 (thirty) days. 30 tablet 1   fluvoxaMINE  (LUVOX ) 25 MG tablet Take 3 tablets (75 mg total) by mouth at bedtime. 90 tablet 1   levocetirizine (XYZAL ) 5 MG tablet Take 5 mg by mouth every evening.  5   metoprolol  tartrate (LOPRESSOR ) 50 MG tablet Take 1 tablet (50 mg total) by mouth once for 1 dose. 1 tablet 0   No current facility-administered medications for this visit.    Medication Side Effects: None  Allergies:  Allergies  Allergen Reactions   Sulfa Antibiotics Nausea And Vomiting    Causes pt to vomit blood Other reaction(s): vomiting blood   Gluten Meal Other (See Comments)    Sensitivity.    Lactose Intolerance (Gi) Nausea And Vomiting   Aspirin     Other reaction(s): colitis   Bromfed Dm [Pseudoeph-Bromphen-Dm]     Other reaction(s): strung out and hallucinating   Chlorpromazine     Other reaction(s): Dark thoughts   Desipramine     Other reaction(s): rash   Gabapentin      Other reaction(s): swelling / fuzzy vision   Hydrocodone      Other reaction(s): causes hangovers   Lamotrigine      Other reaction(s): rash   Metronidazole     Other reaction(s): vomiting blood   Morphine Sulfate     Other reaction(s): tearful   Nsaids     Other reaction(s): colitis   Flagyl [Metronidazole Hcl] Nausea And Vomiting   Morphine And Codeine   Other (See Comments)    Reaction: Depression, emotional    Past Medical History:  Diagnosis Date   Abdominal pain, unspecified site 10/18/2012   Anemia    Anxiety    Arthritis    osteoarthritis   ASCUS (atypical squamous cells of undetermined significance) on Pap smear 05/06/2005   NEG HIGH RISK HPV--C&B BIOPSY BENIGN 12/2005   Asthma    Bipolar 2 disorder (HCC)    Cancer (HCC)    skin cancer - basal cell   Colon polyps    Complication of anesthesia    anxious afterwards, will get headaches    Constipation    Depression    Fibromyalgia 10/2013   GERD (gastroesophageal reflux disease)    Hearing loss on left    Heart murmur    never had any problems   Hemorrhoids    High cholesterol    High risk HPV infection 08/2011   cytology negative   IBS (irritable bowel syndrome)    Insomnia    Lymphocytic colitis    Lymphoma (HCC)    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   Bone marrow biopsy showes 8% plasma cells IgA Lambda   Migraines    Nausea alone 10/18/2012   Osteoarthritis    Osteopenia    Peripheral neuropathy    PTSD (post-traumatic stress disorder)     Family History  Problem Relation Age of Onset   Allergic  rhinitis Mother    Allergic rhinitis Father    Diabetes Father    Hypertension Father    Hyperlipidemia Father    Allergic rhinitis Sister    Allergic rhinitis Brother    Allergic rhinitis Brother    Allergic rhinitis Maternal Aunt    Allergic rhinitis Maternal Uncle    Allergic rhinitis Paternal Aunt    Allergic rhinitis Paternal Uncle    Allergic rhinitis Maternal Grandmother    Allergic rhinitis Maternal Grandfather    Heart disease Maternal Grandfather    Allergic rhinitis Paternal Grandmother    Heart disease Paternal Grandmother    Allergic rhinitis Paternal Grandfather    Heart disease Paternal Grandfather    Allergic rhinitis Daughter    Depression Daughter    Anxiety disorder Daughter    Asthma Daughter    Atopy  Son    Eczema Son    Allergic rhinitis Son    Depression Son    Atopy Son    Allergic rhinitis Son    Depression Son    Anxiety disorder Son    Breast cancer Neg Hx     Social History   Socioeconomic History   Marital status: Divorced    Spouse name: Not on file   Number of children: 3   Years of education: Not on file   Highest education level: Not on file  Occupational History   Not on file  Tobacco Use   Smoking status: Former    Current packs/day: 0.00    Types: Cigarettes    Quit date: 05/13/2014    Years since quitting: 8.8   Smokeless tobacco: Never   Tobacco comments:    social   Vaping Use   Vaping status: Never Used  Substance and Sexual Activity   Alcohol use: Yes    Alcohol/week: 0.0 standard drinks of alcohol    Comment: rare   Drug use: Never   Sexual activity: Not Currently    Comment: -1st intercourse 63 yo-More than 5 partners  Other Topics Concern   Not on file  Social History Narrative   Not on file   Social Drivers of Health   Financial Resource Strain: Not on file  Food Insecurity: Low Risk  (01/17/2023)   Received from [facility]   Hunger Vital Sign    Worried About Running Out of Food in the Last Year: Never true    Ran Out of Food in the Last Year: Never true  Transportation Needs: No Transportation Needs (01/17/2023)   Received from [facility]    In the past 12 months, has lack of reliable transportation kept you from medical appointments, meetings, work or from getting things needed for daily living? : No  Physical Activity: Not on file  Stress: Not on file  Social Connections: Unknown (05/11/2022)   Received from [facility], [facility]   Social Network    Social Network: Not on file  Intimate Partner Violence: Unknown (05/11/2022)   Received from [facility], [facility]   HITS    Physically Hurt: Not on file    Insult or Talk Down To: Not on file    Threaten Physical Harm: Not on file     Scream or Curse: Not on file    Past Medical History, Surgical history, Social history, and Family history were reviewed and updated as appropriate.   Please see review of systems for further details on the patient's review from today.   Objective:  Physical Exam:  LMP 12/28/2010   Physical Exam Constitutional:      General: Danielle Bruce is not in acute distress. Musculoskeletal:        General: No deformity.  Neurological:     Mental Status: Danielle Bruce is alert and oriented to person, place, and time.     Coordination: Coordination normal.  Psychiatric:        Attention and Perception: Attention and perception normal. Danielle Bruce does not perceive auditory or visual hallucinations.        Mood and Affect: Mood is anxious and depressed. Affect is not labile, angry or tearful.        Speech: Speech normal.        Behavior: Behavior normal.        Thought Content: Thought content normal. Thought content is not paranoid or delusional. Thought content does not include homicidal or suicidal ideation. Thought content does not include suicidal plan.        Cognition and Memory: Cognition and memory normal.        Judgment: Judgment normal.     Comments: Insight intact Depression worse again       Lab Review:     Component Value Date/Time   NA 140 01/10/2023 1125   NA 144 07/12/2015 1521   K 4.5 01/10/2023 1125   K 3.8 07/12/2015 1521   CL 103 01/10/2023 1125   CO2 24 01/10/2023 1125   CO2 27 07/12/2015 1521   GLUCOSE 93 01/10/2023 1125   GLUCOSE 98 10/30/2022 1702   GLUCOSE 82 07/12/2015 1521   BUN 16 01/10/2023 1125   BUN 10.3 07/12/2015 1521   CREATININE 0.61 01/10/2023 1125   CREATININE 0.87 08/01/2016 1713   CREATININE 0.8 07/12/2015 1521   CALCIUM  9.4 01/10/2023 1125   CALCIUM  9.9 07/12/2015 1521   PROT 7.4 08/01/2016 1713   PROT 7.4 07/12/2015 1521   ALBUMIN 4.8 08/01/2016 1713   ALBUMIN 4.4 07/12/2015 1521   AST 23 08/01/2016 1713   AST 22 07/12/2015 1521   ALT 26 08/01/2016 1713    ALT 31 07/12/2015 1521   ALKPHOS 55 08/01/2016 1713   ALKPHOS 54 07/12/2015 1521   BILITOT 0.7 08/01/2016 1713   BILITOT 0.40 07/12/2015 1521   GFRNONAA >60 10/30/2022 1702   GFRNONAA 76 08/01/2016 1713   GFRAA 88 08/01/2016 1713       Component Value Date/Time   WBC 5.6 10/30/2022 1702   RBC 4.65 10/30/2022 1702   HGB 14.2 10/30/2022 1702   HGB 14.1 07/12/2015 1521   HCT 41.3 10/30/2022 1702   HCT 41.5 07/12/2015 1521   PLT 247 10/30/2022 1702   PLT 248 07/12/2015 1521   MCV 88.8 10/30/2022 1702   MCV 92.2 07/12/2015 1521   MCH 30.5 10/30/2022 1702   MCHC 34.4 10/30/2022 1702   RDW 12.0 10/30/2022 1702   RDW 13.3 07/12/2015 1521   LYMPHSABS 2,006 08/01/2016 1713   LYMPHSABS 1.8 07/12/2015 1521   MONOABS 354 08/01/2016 1713   MONOABS 0.7 07/12/2015 1521   EOSABS 118 08/01/2016 1713   EOSABS 0.1 07/12/2015 1521   BASOSABS 0 08/01/2016 1713   BASOSABS 0.0 07/12/2015 1521    No results found for: POCLITH, LITHIUM   No results found for: PHENYTOIN, PHENOBARB, VALPROATE, CBMZ   Genesight:  2D6 poor,  SLC6A4 short/short MTHFR homozygous   .res Assessment: Plan:    Danielle Bruce was seen today for follow-up, depression, add, anxiety and sleeping problem.  Diagnoses and all orders for this  visit:  Major depressive disorder, recurrent episode, moderate (HCC) -     fluvoxaMINE  (LUVOX ) 25 MG tablet; Take 3 tablets (75 mg total) by mouth at bedtime.  PTSD (post-traumatic stress disorder) -     fluvoxaMINE  (LUVOX ) 25 MG tablet; Take 3 tablets (75 mg total) by mouth at bedtime.  Attention deficit hyperactivity disorder (ADHD), combined type -     amphetamine -dextroamphetamine (ADDERALL XR) 20 MG 24 hr capsule; Take 1 capsule (20 mg total) by mouth daily.  Obsessional thoughts  Fibromyalgia  Generalized anxiety disorder -     fluvoxaMINE  (LUVOX ) 25 MG tablet; Take 3 tablets (75 mg total) by mouth at bedtime.  Panic disorder with agoraphobia -      fluvoxaMINE  (LUVOX ) 25 MG tablet; Take 3 tablets (75 mg total) by mouth at bedtime.  Insomnia due to mental condition  Mixed obsessional thoughts and acts -     fluvoxaMINE  (LUVOX ) 25 MG tablet; Take 3 tablets (75 mg total) by mouth at bedtime.  Low vitamin D  level  Binge-eating disorder, moderate    Past couple of years more obvious PTSD obvious has called into doubt the bipolar dix bc Danielle Bruce's remained mood stable off the lamotrigine .    Danielle Bruce has had symptoms consistent with hypomania in the past but there is now some question as to whether they were PTSD related rather than truly bipolar 2.  Her son has had treatment resistant depression with possible bipolar disorder and her daughter has been diagnosed with bipolar disorder but also a history of borderline personality disorder.  So there is a family history of the spectrum.  Relapse depression.  Dealing with grief over F's death.   Prior benefit fluvoxamine  for anxiety and mood disorders.  No SE DT depression increase fluvoxamine  75 mg daily.   Clear benefit from fluvoxamine  for mood quite significantly. No manic sx. Consider weaning when therapy has helped a little more and further progress.  HA are better 2-4/month.  Still has them at times.    Disc sleep hygiene and temperature.     discussed the short-term risks associated with benzodiazepines including sedation and increased fall risk among others.  Discussed long-term side effect risk including dependence, potential withdrawal symptoms, and the potential eventual dose-related risk of dementia.  But recent studies from 2020 dispute this association between benzodiazepines and dementia risk. Newer studies in 2020 do not support an association with dementia.   Keep this at a minimum given cognitive concerns.  Danielle Bruce only uses it when triggered with anxiety usually medical.  Danielle Bruce is not using it regularly. Continue Ativan  prn 1 mg for panic and triggers.  Discussed potential benefits,  risks, and side effects of stimulants with patient to include increased heart rate, palpitations, insomnia, increased anxiety, increased irritability, or decreased appetite.  Instructed patient to contact office if experiencing any significant tolerability issues.  Switch Vyvanse  to Adderall XR 20 mg AM  bc Vyvanse  causing insomnia.   May also help mood and weight loss should help pain.  Has been helpful.   Caution it could cause anxiety and other SE  Therapy helping emotional management.   trial methylated  B12.  L-methylfolate 15 mg daily, lorazepam  1 mg as needed panic, Lyrica 50 mg twice daily.   Trazodone  50 mg nightly as needed insomnia.  FU 2 mos  Lorene Macintosh, MD, DFAPA    Please see After Visit Summary for patient specific instructions.  Future Appointments  Date Time Provider Department Center  03/13/2023  3:00 PM  Cottle, Bambi G, LCSW [facility] None  03/19/2023 12:30 PM Sophie Burnard HERO, PT [facility] None  03/20/2023  3:00 PM Cottle, Bambi G, LCSW [facility] None  03/27/2023  3:00 PM Cottle, Bambi G, LCSW [facility] None  04/03/2023  3:00 PM Cottle, Bambi G, LCSW [facility] None  04/09/2023 12:30 PM Sophie Burnard HERO, PT [facility] None  04/10/2023  3:00 PM Cottle, Bambi G, LCSW [facility] None  04/17/2023  3:00 PM Cottle, Bambi G, LCSW [facility] None  04/24/2023  3:00 PM Cottle, Bambi G, LCSW [facility] None  04/30/2023 11:45 AM Sophie Burnard HERO, PT [facility] None  04/30/2023  3:00 PM Luke Orlan HERO, DO [facility] None  05/01/2023  3:00 PM Cottle, Bambi G, LCSW [facility] None  05/08/2023  3:00 PM Cottle, Bambi G, LCSW [facility] None  05/10/2023  2:20 PM Kate Lonni CROME, MD [facility] None  05/15/2023  3:00 PM Cottle, Bambi G, LCSW [facility] None  05/21/2023 11:45 AM Sophie Burnard HERO, PT [facility] None  05/22/2023  3:00 PM Cottle, Bambi G, LCSW [facility] None  05/29/2023  3:00 PM Cottle, Bambi G, LCSW [facility] None  06/19/2023  3:00 PM Cottle, Bambi G, LCSW [facility] None  06/26/2023  3:00 PM Cottle, Bambi  G, LCSW [facility] None    No orders of the defined types were placed in this encounter.      -------------------------------

## 2023-03-08 NOTE — Therapy (Signed)
 OUTPATIENT PHYSICAL THERAPY TREATMENT  Progress Note Reporting Period 10/24/22 to 03/08/23  See note below for Objective Data and Assessment of Progress/Goals.     Patient Name: Danielle Bruce MRN: [id number] DOB:10-16-1960, 63 y.o., female Today's Date: 03/08/2023  PT End of Session - 03/08/23 1146     Visit Number 68    Date for PT Re-Evaluation 05/03/23    Authorization Type Medicare B- KX at 15 for 2025 (1/15)    Progress Note Due on Visit 78    PT Start Time 1108    PT Stop Time 1147    PT Time Calculation (min) 39 min    Activity Tolerance Patient tolerated treatment well    Behavior During Therapy [facility] for tasks assessed/performed                          PT End of Session - 03/08/23 1146     Visit Number 68    Date for PT Re-Evaluation 05/03/23    Authorization Type Medicare B- KX at 15 for 2025 (1/15)    Progress Note Due on Visit 78    PT Start Time 1108    PT Stop Time 1147    PT Time Calculation (min) 39 min    Activity Tolerance Patient tolerated treatment well    Behavior During Therapy [facility] for tasks assessed/performed                                                                     Past Medical History:  Diagnosis Date   Abdominal pain, unspecified site 10/18/2012   Anemia    Anxiety    Arthritis    osteoarthritis   ASCUS (atypical squamous cells of undetermined significance) on Pap smear 05/06/2005   NEG HIGH RISK HPV--C&B BIOPSY BENIGN 12/2005   Asthma    Bipolar 2 disorder (HCC)    Cancer (HCC)    skin cancer - basal cell   Colon polyps    Complication of anesthesia    anxious afterwards, will get headaches    Constipation    Depression    Fibromyalgia 10/2013   GERD (gastroesophageal reflux disease)    Hearing loss on left    Heart murmur    never had any problems   Hemorrhoids    High cholesterol    High risk HPV infection 08/2011   cytology negative   IBS  (irritable bowel syndrome)    Insomnia    Lymphocytic colitis    Lymphoma (HCC)    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 Cornett, MD;  Location: [facility];  Service: General;  Laterality: N/A;   COLONOSCOPY  numerous times   DILATION AND CURETTAGE OF UTERUS     ESOPHAGOGASTRODUODENOSCOPY     HEMORRHOID SURGERY  1993   x3   IUD REMOVAL  02/2015   Mirena   LAPAROSCOPIC LYSIS OF ADHESIONS N/A 10/16/2012   Procedure: LAPAROSCOPIC LYSIS OF ADHESIONS;  Surgeon: Debby A. Cornett, MD;  Location: [facility];  Service: General;  Laterality: N/A;   LAPAROSCOPY N/A 10/16/2012   Procedure: LAPAROSCOPY DIAGNOSTIC;  Surgeon: Debby LABOR. Cornett, MD;  Location: [facility];  Service: General;  Laterality: N/A;   PELVIC LAPAROSCOPY     RADIOLOGY WITH ANESTHESIA N/A 12/16/2015   Procedure: MRI OF BRAIN WITH AND WITHOUT CONTRAST;  Surgeon: Medication Radiologist, MD;  Location: [facility];  Service: Radiology;  Laterality: N/A;   SHOULDER SURGERY  2007/2008   SPINE SURGERY  2010   cervical   Patient Active Problem List   Diagnosis Date Noted   GAD (generalized anxiety disorder) 12/26/2017   OCD (obsessive compulsive disorder) 12/26/2017   PTSD (post-traumatic stress disorder) 12/26/2017   DDD (degenerative disc disease), cervical 07/14/2016   Primary osteoarthritis of both feet 07/14/2016   Primary osteoarthritis of both hands 07/14/2016   Other fatigue 07/14/2016   History of IBS 07/14/2016    Osteopenia of multiple sites 07/14/2016   Fibromyalgia 01/13/2016   MGUS (monoclonal gammopathy of unknown significance) 12/23/2013   Chronic cholecystitis without calculus 10/18/2012   Abdominal pain 10/18/2012   Nausea alone 10/18/2012   Constipation 10/18/2012   Depression 10/18/2012   Anxiety    ASCUS (atypical squamous cells of undetermined significance) on Pap smear    IUD    Hemorrhoids 11/29/2010   Abdominal pain, left upper quadrant 11/29/2010    PCP:  Joen Gentry, MD  REFERRING PROVIDER: Joen Gentry, MD  REFERRING DIAG: fibromyalgia, TMJ dysfunction (added 04/20/22)  THERAPY DIAG:  Abnormal posture - Plan: PT plan of care cert/re-cert  Muscle weakness (generalized) - Plan: PT plan of care cert/re-cert  Cramp and spasm - Plan: PT plan of care cert/re-cert  Polymyalgia rheumatica (HCC) - Plan: PT plan of care cert/re-cert  Cervicalgia - Plan: PT plan of care cert/re-cert  Rationale for Evaluation and Treatment Rehabilitation  ONSET DATE: chronic pain (fibromyalgia) with flare-up 2 months ago  SUBJECTIVE:                                                                                                                                                                                                         SUBJECTIVE STATEMENT:  I visited my daughter and the kids for 9 days.  I had a lot of movement.  We did yoga and danced. I've had more pain since I've returned due to the weather.  I've been depressed due to Christmas without my dad.      PERTINENT HISTORY:  Anxiety, bipolar disorder, depression, fibromyalgia, IBS, migraines, osteopenia, PTSD  PAIN: 03/08/23 Are you having pain: yes Pain location: neck/thoracic spine   Pain rating: 5/10 hands, 4/10 neck and thoracic  Pain description: irritating, annoying   Aggravating factors: doing too much Relieving factors: muscle relaxers, rest, stretching  PRECAUTIONS: Other: chronic pain syndrome and  depression Other: slow progression with exercise due to chronic condition  WEIGHT BEARING RESTRICTIONS No  FALLS:  Has patient fallen in last 6 months? No  LIVING ENVIRONMENT: Lives with: lives with their family Lives in: House/apartment  OCCUPATION: on disability  PLOF: Independent with basic ADLs and Leisure: yardwork, walking  Pt cares for her 4 young grandchildren- difficulty with care including lifting and carrying  PATIENT GOALS be more active with less pain, exercise at the gym regularly, lift and carry grandchildren and laundry, sleep with fewer interruptions, get back to more gardening.  OBJECTIVE:   DIAGNOSTIC FINDINGS: none recent   PATIENT SURVEYS:  The Patient-Specific Functional Scale  Initial:  I am going to ask you to identify up to 3 important activities that you are unable to do or are having difficulty with as a result of this problem.  Today are there any activities that you are unable to do or having difficulty with because of this?  (Patient shown scale and patient rated each activity)  Follow up: When you first came in you had difficulty performing these activities.  Today do you still have difficulty?  Patient-Specific activity scoring scheme (Point to one number):  0 1 2 3 4 5 6 7 8 9  10 Unable                                                                                                          Able to perform To perform                                                                                                    activity at the same Activity         Level as before  Injury or problem  Activity   Lifting laundry and groceries                            Initial:       2  (11/22/22)              follow up:  2 (01/15/23)  follow up: 3 (03/08/23)  2.    Yard work- up and down with squatting          Initial:     4-5 (11/22/22)                   follow up: 7/10 (01/15/23)  follow-up: 7/10 (03/08/23)  3.    Drive long distances                                           Initial:   3-4 (11/22/22)                    follow up: 4/10 (01/15/23)  follow-up: not doing this    COGNITION: Overall cognitive status: Within functional limits for tasks assessed  SENSATION: WFL  POSTURE: rounded shoulders and forward head  PALPATION: Diffuse palpable tenderness over bil neck, upper traps, thoracic, lumbar and gluteals with trigger points.    CERVICAL ROM:   Active ROM A/ROM 04/20/22 A/ROM 08/07/22  Flexion  60  Extension    Right lateral flexion 40 41  Left lateral flexion 45 50  Right rotation 65 70  Left rotation 65 75   (Blank rows = not tested) TMJ: opening limited by 25%, clicking with opening on the Rt.  Palpable tenderness over Rt masseter and at TMJ UPPER EXTREMITY ROM: UE A/ROM is limited by 20% into flexion and abduction with pain at end range.  Hip flexibility is limited by 25% in all directions with pain in all directions.  UPPER EXTREMITY MMT: UE: 4+/5, LE 4+/5 except hip flexors 4-/5  TODAY'S TREATMENT:  03/07/22: NuStep: Level 3 x 10 minutes for endurance and mobility-PT present to discuss progress. Ball roll outs forward and lateral x10 each Seated on green ball: 2# 3 way raises 2x10 Trigger Point Dry Needling  Subsequent Treatment: Instructions provided previously at initial dry needling treatment.   Patient Verbal Consent Given: Yes Education Handout Provided: Previously Provided Muscles Treated: bil neck, upper traps, thoracic multifidi Electrical Stimulation Performed: No Treatment Response/Outcome: elongation and release to treated muscles after DN  02/07/23: NuStep: Level 3 x 10 minutes for endurance and mobility-PT present to discuss progress. Ball roll outs forward and lateral x10 each Seated on green ball: 2# 3 way raises 2x10 Melt Method: all directions Open book x10 bil each Sit to  stand with 5# and upright row: 2x10   01/15/23: NuStep: Level 3 x 10 minutes for endurance and mobility-PT present to discuss progress. Ball roll outs forward and lateral x10 each  Sit to stand with 5# and upright row: 2x10  Trigger Point Dry-Needling  Treatment instructions: Expect mild to moderate muscle soreness. S/S of pneumothorax if dry needled over a lung field, and to seek immediate medical attention should they occur. Patient verbalized understanding of these instructions and education.  Patient Consent Given: Yes Education handout provided: Previously provided Muscles treated: bil cervical multifidi, upper traps, suboccipitals Treatment response/outcome: Utilized skilled palpation to identify  trigger points.  During dry needling able to palpate muscle twitch and muscle elongation  Elongation and release to bil neck and upper traps after DN Skilled palpation and monitoring by PT during dry needling   HOME EXERCISE PROGRAM: Access Code: [email or web address] Access Code: [email or web address] URL: [email or web address] Date: 09/06/2022 Prepared by: Burnard                - Standing Hip Abduction with Counter Support  - 1 x daily - 7 x weekly - 1-2 sets - 10 reps - Standing Hip Extension with Chair  - 1 x daily - 7 x weekly - 1-2 sets - 10 reps ASSESSMENT:  CLINICAL IMPRESSION: Pt has been more consistently active when visiting her grandchildren. She is consistent with her HEP as able and reports that she lacks motivation.  She has had some increased pain due to weather changes and depression associated with loss of her dad.  Session focused on gentle mobility and strength.  Lifting groceries and laundry is improved on PSFS to 3/10.  Slow progress with PT due to external stressors and chronicity of pain condition in addition to other medical issues. Patient will benefit from skilled PT to address the below impairments and improve overall function.   OBJECTIVE IMPAIRMENTS decreased activity  tolerance, decreased endurance, difficulty walking, decreased strength, increased muscle spasms, impaired flexibility, improper body mechanics, postural dysfunction, and pain.   ACTIVITY LIMITATIONS carrying, lifting, sitting, standing, reach over head, hygiene/grooming, and locomotion level  PARTICIPATION LIMITATIONS: meal prep, cleaning, laundry, driving, community activity, and yard work  PERSONAL FACTORS Past/current experiences, Time since onset of injury/illness/exacerbation, and 3+ comorbidities: fibromyalgia depression, anxiety,   are also affecting patient's functional outcome.   REHAB POTENTIAL: Good  CLINICAL DECISION MAKING: Evolving/moderate complexity  EVALUATION COMPLEXITY: Moderate   GOALS: Goals reviewed with patient? Yes  SHORT TERM GOALS: Target date: 12/22/22  Return to regular performance of HEP 4-5x/week to improve mobility  Baseline: met since last visit (12/06/22) Goal status: In progress  2.  Rate lifting (groceries and laundry) > or = to 3/10 on patient specific functional scale (PSFS) Baseline: 2/10 Goal status: NEW  3.  Initiate walking program for exercise > or = to 2x/wk Baseline: not walking  Goal status: NEW    LONG TERM GOALS: Target date: 05/02/23  Be independent in advanced HEP Baseline: exercising when pain and mental health allows, PT assessing every 3 weeks (11/22/22) Goal status: In progress   2.  Rate squatting for yard work > or = to 6/10 on PSFS Baseline: 7/10 (01/15/23) Goal status: MET  3.  Rate driving > or = to 4/89 on PSFS Baseline: hasn't been driving, taking train for long trips (03/08/23) Goal status: in progress    4.  Walk for exercise and perform regular exercises 3-5x/wk for exercise to improve endurance  Baseline: not walking due to cold weather, doing exercises 3x/wk (03/08/23) Goal status: In progress    5.  Rate lifting (groceries and laundry) > or = to 4/10 on PSFS  Baseline: 3/10  (03/08/23) Goal status: in  progress    PLAN: PT FREQUENCY: every other week  PT DURATION: 8 weeks  PLANNED INTERVENTIONS: Therapeutic exercises, Therapeutic activity, Neuromuscular re-education, Balance training, Gait training, Patient/Family education, Self Care, Joint mobilization, Aquatic Therapy, Dry Needling, Spinal manipulation, Spinal mobilization, Cryotherapy, Moist heat, Taping, Traction, Manual therapy, and Re-evaluation  PLAN FOR NEXT SESSION:  1 visit every 2-3 weeks- modify HEP, manual for pain  Burnard Joy, PT 03/08/23 11:50  AM

## 2023-03-13 ENCOUNTER — Ambulatory Visit (INDEPENDENT_AMBULATORY_CARE_PROVIDER_SITE_OTHER): Payer: Medicare Other | Admitting: Psychology

## 2023-03-13 DIAGNOSIS — F331 Major depressive disorder, recurrent, moderate: Secondary | ICD-10-CM | POA: Diagnosis not present

## 2023-03-13 DIAGNOSIS — F4310 Post-traumatic stress disorder, unspecified: Secondary | ICD-10-CM

## 2023-03-13 DIAGNOSIS — F902 Attention-deficit hyperactivity disorder, combined type: Secondary | ICD-10-CM

## 2023-03-13 NOTE — Progress Notes (Signed)
 [facility] Counselor/Therapist Progress Note  Patient ID: RABIA ARGOTE, MRN: [id number],    Date: 03/13/2023  Time Spent:60 minutes Time In:  3:09 Time out: 4:09  Treatment Type: Individual Therapy  Reported Symptoms: flashbacks, responding to triggers, depression, anxiety, being busy all of the time, dissociation  Mental Status Exam: Appearance:  Casual     Behavior: Appropriate  Motor: normal  Speech/Language:  Clear and Coherent  Affect: Blunt  Mood: pleasant  Thought process: normal  Thought content:   WNL  Sensory/Perceptual disturbances:   WNL  Orientation: oriented to person, place, time/date, and situation  Attention: Good  Concentration: Good  Memory: WNL  Fund of knowledge:  Good  Insight:   Good  Judgment:  Fair  Impulse Control: Good   Risk Assessment: Danger to Self:  No Self-injurious Behavior: No Danger to Others: No Duty to Warn:no Physical Aggression / Violence:No  Access to Firearms a concern: No  Gang Involvement:No   Subjective: The patient attended a face-to-face individual therapy in the office today.  The patient presents with a blunted affect and her mood is pleasant.  The patient reports that she is concerned about [facility].  We talked about the things that she has been saying to [facility] and it seems that those things are on target and should be helpful to her.  The patient reports that she is doing better than she was the last time I saw her.  She has some time coming up that she is going to go down [place] to see her daughter and grandchildren and that makes her very happy.  The patient seems to be handling things better this week than she did last week.  We talked about her implementing some of the things that we have talked about in the past and she has done so and feels good about being able to do this.   Interventions: Cognitive Behavioral Therapy, Mindfulness Meditation, Eye Movement Desensitization and Reprocessing (EMDR),  Insight-Oriented, and Interpersonal, psychoeducation  Diagnosis:Major depressive disorder, recurrent episode, moderate (HCC)  PTSD (post-traumatic stress disorder)  Attention deficit hyperactivity disorder (ADHD), combined type  Plan: Plan of Care: Client Abilities/Strengths  Insightful, motivated, Supportive family Client Treatment Preferences  Outpatient Individual therapy/EMDR  Client Statement of Needs  I think I have PTSD and I feel like I need another kind of therapy than talk therapy Treatment Level  Outpatient Individual therapy  Symptoms  Demonstrates an exaggerated startle response.:  (Status: maintained). Depressed  or irritable mood.: (Status:maintained). Describes a reliving of the event,  particularly through dissociative flashbacks.: (Status: maintained). Displays a  significant decline in interest and engagement in activities.:  (Status: maintained). Displays significant psychological and/or physiological distress resulting from internal and external  clues that are reminiscent of the traumatic event.: (Status: maintained).  Experiences disturbances in sleep.:  (Status: maintained). Experiences disturbing  and persistent thoughts, images, and/or perceptions of the traumatic event.:  (Status: maintained). Experiences frequent nightmares.: (Status: maintained).  Feelings of hopelessness, worthlessness, or inappropriate guilt.: No Description Entered (Status:  improved). Has been exposed to a traumatic event involving actual or perceived threat of death or  serious injury.:  (Status: maintained). Impairment in social, occupational, or  other areas of functioning.:(Status: maintained). Intentionally avoids activities,  places, people, or objects (e.g., up-armored vehicles) that evoke memories of the event.: (Status: maintained). Intentionally avoids thoughts, feelings, or discussions related  to the traumatic event.: (Status: maintained). Reports difficulty concentrating  as  well as feelings of guilt (Status:maintained).  Reports response of intense fear,  helplessness, or horror to the traumatic event.:  (Status: maintained).  Problems Addressed  Unipolar Depression, Posttraumatic Stress Disorder (PTSD), Posttraumatic Stress Disorder (PTSD),   Posttraumatic Stress Disorder (PTSD), Posttraumatic Stress Disorder (PTSD)  Goals 1. Develop healthy thinking patterns and beliefs about self, others, and the world that lead to the alleviation and help prevent the relapse of  depression. Objective Identify and replace thoughts and beliefs that support depression. Target Date: 2024/01/04 Frequency: Weekly Progress: 60 Modality: individual Related Interventions 1. Explore and restructure underlying assumptions and beliefs reflected in biased self-talk that  may put the client at risk for relapse or recurrence. 2. Conduct Cognitive-Behavioral Therapy (see Cognitive Behavior Therapy by Almarie; Overcoming Depression by Marine dunker al.), beginning with helping the client learn the connection among  cognition, depressive feelings, and actions. 2. Eliminate or reduce the negative impact trauma related symptoms have  on social, occupational, and family functioning. Objective Learn and implement personal skills to manage challenging situations related to trauma. Target Date: 2024/01/04 Frequency: Weekly Progress: 0 Modality: individual 3. No longer avoids persons, places, activities, and objects that are  reminiscent of the traumatic event. Objective Participate in Eye Movement Desensitization and Reprocessing (EMDR) to reduce emotional distress  related to traumatic thoughts, feelings, and images. Target Date: 2024/01/04 Frequency: Weekly Progress: 0 Modality: individual   Related Interventions 1. Utilize Eye Movement Desensitization and Reprocessing (EMDR) to reduce the client's  emotional reactivity to the traumatic event and reduce PTSD symptoms. Objective Learn  and implement guided self-dialogue to manage thoughts, feelings, and urges brought on by  encounters with trauma-related situations. Target Date: 2025-11/07 Frequency: Weekly Progress: 10 Modality: individual Related Interventions 1. Teach the client a guided self-dialogue procedure in which he/she learns to recognize  maladaptive self-talk, challenges its biases, copes with engendered feelings, overcomes  avoidance, and reinforces his/her accomplishments; review and reinforce progress, problemsolve obstacles. 4. No longer experiences intrusive event recollections, avoidance of event  reminders, intense arousal, or disinterest in activities or  relationships. 5. Thinks about or openly discusses the traumatic event with others  without experiencing psychological or physiological distress. Diagnosis Axis  none F43.10 (Posttraumatic stress disorder) - Open - [Signifier: n/a] Posttraumatic Stress  Disorder  Conditions For Discharge Achievement of treatment goals and objectives   Keyleen Cerrato G Krista Godsil, LCSW

## 2023-03-17 ENCOUNTER — Other Ambulatory Visit: Payer: Self-pay | Admitting: Psychiatry

## 2023-03-17 DIAGNOSIS — F5105 Insomnia due to other mental disorder: Secondary | ICD-10-CM

## 2023-03-19 ENCOUNTER — Ambulatory Visit: Payer: Medicare Other

## 2023-03-20 ENCOUNTER — Ambulatory Visit: Payer: Medicare Other | Admitting: Psychology

## 2023-03-27 ENCOUNTER — Ambulatory Visit: Payer: Medicare Other | Admitting: Psychology

## 2023-03-27 DIAGNOSIS — F4310 Post-traumatic stress disorder, unspecified: Secondary | ICD-10-CM

## 2023-03-27 DIAGNOSIS — F902 Attention-deficit hyperactivity disorder, combined type: Secondary | ICD-10-CM | POA: Diagnosis not present

## 2023-03-27 DIAGNOSIS — F422 Mixed obsessional thoughts and acts: Secondary | ICD-10-CM

## 2023-03-27 DIAGNOSIS — F331 Major depressive disorder, recurrent, moderate: Secondary | ICD-10-CM

## 2023-03-27 DIAGNOSIS — F428 Other obsessive-compulsive disorder: Secondary | ICD-10-CM

## 2023-03-27 NOTE — Progress Notes (Signed)
[facility] Counselor/Therapist Progress Note  Patient ID: Danielle Bruce, MRN: [id number],    Date: 03/27/2023  Time Spent:65 minutes Time In:  3:00 Time out: 4:05  Treatment Type: Individual Therapy  Reported Symptoms: flashbacks, responding to triggers, depression, anxiety, being busy all of the time, dissociation  Mental Status Exam: Appearance:  Casual     Behavior: Appropriate  Motor: normal  Speech/Language:  Clear and Coherent  Affect: Blunt  Mood: sad  Thought process: normal  Thought content:   WNL  Sensory/Perceptual disturbances:   WNL  Orientation: oriented to person, place, time/date, and situation  Attention: Good  Concentration: Good  Memory: WNL  Fund of knowledge:  Good  Insight:   Good  Judgment:  Fair  Impulse Control: Good   Risk Assessment: Danger to Self:  No Self-injurious Behavior: No Danger to Others: No Duty to Warn:no Physical Aggression / Violence:No  Access to Firearms a concern: No  Gang Involvement:No   Subjective: The patient attended a face-to-face individual therapy in the office today.  The patient presents with a blunted affect and her mood is sad.  The patient reports that she is concerned about what is going on in [place] and has been watching the news and doing a lot of "what if" thinking.  She was very focused today on being concerned about her transgendered son and what is going to happen with him and several things about what has been happening in the news.  We talked about her not watching so much news or listening to so much and trying to change the focus to taking care of herself.  She was very negative today and it was difficult to get her to change her perspective.  We talked about her trying to find some things that give her some pleasure and joy that she could focus on other than what is on the news.  I told her that the other option would be to write her representatives if she felt that that would be  beneficial to her and feel like she could do something.  She was just very negative today and there were a lot" yes buts".  Interventions: Cognitive Behavioral Therapy, Mindfulness Meditation, Eye Movement Desensitization and Reprocessing (EMDR), Insight-Oriented, and Interpersonal, psychoeducation  Diagnosis:Major depressive disorder, recurrent episode, moderate (HCC)  PTSD (post-traumatic stress disorder)  Attention deficit hyperactivity disorder (ADHD), combined type  Obsessional thoughts  Plan: Plan of Care: Client Abilities/Strengths  Insightful, motivated, Supportive family Client Treatment Preferences  Outpatient Individual therapy/EMDR  Client Statement of Needs  "I think I have PTSD and I feel like I need another kind of therapy than talk therapy" Treatment Level  Outpatient Individual therapy  Symptoms  Demonstrates an exaggerated startle response.:  (Status: maintained). Depressed  or irritable mood.: (Status:maintained). Describes a reliving of the event,  particularly through dissociative flashbacks.: (Status: maintained). Displays a  significant decline in interest and engagement in activities.:  (Status: maintained). Displays significant psychological and/or physiological distress resulting from internal and external  clues that are reminiscent of the traumatic event.: (Status: maintained).  Experiences disturbances in sleep.:  (Status: maintained). Experiences disturbing  and persistent thoughts, images, and/or perceptions of the traumatic event.:  (Status: maintained). Experiences frequent nightmares.: (Status: maintained).  Feelings of hopelessness, worthlessness, or inappropriate guilt.: No Description Entered (Status:  improved). Has been exposed to a traumatic event involving actual or perceived threat of death or  serious injury.:  (Status: maintained). Impairment in social, occupational, or  other areas  of functioning.:(Status: maintained). Intentionally avoids  activities,  places, people, or objects (e.g., up-armored vehicles) that evoke memories of the event.: (Status: maintained). Intentionally avoids thoughts, feelings, or discussions related  to the traumatic event.: (Status: maintained). Reports difficulty concentrating as  well as feelings of guilt (Status:maintained). Reports response of intense fear,  helplessness, or horror to the traumatic event.:  (Status: maintained).  Problems Addressed  Unipolar Depression, Posttraumatic Stress Disorder (PTSD), Posttraumatic Stress Disorder (PTSD),   Posttraumatic Stress Disorder (PTSD), Posttraumatic Stress Disorder (PTSD)  Goals 1. Develop healthy thinking patterns and beliefs about self, others, and the world that lead to the alleviation and help prevent the relapse of  depression. Objective Identify and replace thoughts and beliefs that support depression. Target Date: 2024/01/04 Frequency: Weekly Progress: 60 Modality: individual Related Interventions 1. Explore and restructure underlying assumptions and beliefs reflected in biased self-talk that  may put the client at risk for relapse or recurrence. 2. Conduct Cognitive-Behavioral Therapy (see Cognitive Behavior Therapy by Reola Calkins; Overcoming Depression by Agapito Games al.), beginning with helping the client learn the connection among  cognition, depressive feelings, and actions. 2. Eliminate or reduce the negative impact trauma related symptoms have  on social, occupational, and family functioning. Objective Learn and implement personal skills to manage challenging situations related to trauma. Target Date: 2024/01/04 Frequency: Weekly Progress: 0 Modality: individual 3. No longer avoids persons, places, activities, and objects that are  reminiscent of the traumatic event. Objective Participate in Eye Movement Desensitization and Reprocessing (EMDR) to reduce emotional distress  related to traumatic thoughts, feelings, and images. Target  Date: 2024/01/04 Frequency: Weekly Progress: 0 Modality: individual   Related Interventions 1. Utilize Eye Movement Desensitization and Reprocessing (EMDR) to reduce the client's  emotional reactivity to the traumatic event and reduce PTSD symptoms. Objective Learn and implement guided self-dialogue to manage thoughts, feelings, and urges brought on by  encounters with trauma-related situations. Target Date: 2025-11/07 Frequency: Weekly Progress: 10 Modality: individual Related Interventions 1. Teach the client a guided self-dialogue procedure in which he/she learns to recognize  maladaptive self-talk, challenges its biases, copes with engendered feelings, overcomes  avoidance, and reinforces his/her accomplishments; review and reinforce progress, problemsolve obstacles. 4. No longer experiences intrusive event recollections, avoidance of event  reminders, intense arousal, or disinterest in activities or  relationships. 5. Thinks about or openly discusses the traumatic event with others  without experiencing psychological or physiological distress. Diagnosis Axis  none F43.10 (Posttraumatic stress disorder) - Open - [Signifier: n/a] Posttraumatic Stress  Disorder  Conditions For Discharge Achievement of treatment goals and objectives   Javaeh Muscatello G Shavon Ashmore, LCSW

## 2023-03-30 DIAGNOSIS — M797 Fibromyalgia: Secondary | ICD-10-CM | POA: Diagnosis not present

## 2023-03-30 DIAGNOSIS — F4310 Post-traumatic stress disorder, unspecified: Secondary | ICD-10-CM | POA: Diagnosis not present

## 2023-03-30 DIAGNOSIS — G4700 Insomnia, unspecified: Secondary | ICD-10-CM | POA: Diagnosis not present

## 2023-03-30 DIAGNOSIS — F411 Generalized anxiety disorder: Secondary | ICD-10-CM | POA: Diagnosis not present

## 2023-03-30 DIAGNOSIS — G43909 Migraine, unspecified, not intractable, without status migrainosus: Secondary | ICD-10-CM | POA: Diagnosis not present

## 2023-03-30 DIAGNOSIS — F900 Attention-deficit hyperactivity disorder, predominantly inattentive type: Secondary | ICD-10-CM | POA: Diagnosis not present

## 2023-04-03 ENCOUNTER — Ambulatory Visit (INDEPENDENT_AMBULATORY_CARE_PROVIDER_SITE_OTHER): Payer: Medicare Other | Admitting: Psychology

## 2023-04-03 DIAGNOSIS — F331 Major depressive disorder, recurrent, moderate: Secondary | ICD-10-CM

## 2023-04-03 DIAGNOSIS — F902 Attention-deficit hyperactivity disorder, combined type: Secondary | ICD-10-CM

## 2023-04-03 DIAGNOSIS — F4310 Post-traumatic stress disorder, unspecified: Secondary | ICD-10-CM

## 2023-04-03 NOTE — Progress Notes (Signed)
 [facility] Counselor/Therapist Progress Note  Patient ID: JAYLIAH BENETT, MRN: [id number],    Date: 04/03/2023  Time Spent: 56 minutes Time In:  3:03 Time out: 3:59  Treatment Type: Individual Therapy  Reported Symptoms: flashbacks, responding to triggers, depression, anxiety, being busy all of the time, dissociation  Mental Status Exam: Appearance:  Casual     Behavior: Appropriate  Motor: normal  Speech/Language:  Clear and Coherent  Affect: Blunt  Mood: pleasant  Thought process: normal  Thought content:   WNL  Sensory/Perceptual disturbances:   WNL  Orientation: oriented to person, place, time/date, and situation  Attention: Good  Concentration: Good  Memory: WNL  Fund of knowledge:  Good  Insight:   Good  Judgment:  Fair  Impulse Control: Good   Risk Assessment: Danger to Self:  No Self-injurious Behavior: No Danger to Others: No Duty to Warn:no Physical Aggression / Violence:No  Access to Firearms a concern: No  Gang Involvement:No   Subjective: The patient attended a face-to-face individual therapy.  Video visit today.  The patient gave verbal consent for the session to be on caregility.  The patient is aware of the limitations of telehealth.  The patient was in her home alone and the therapist was in the office.  The patient reports that she had a realization that she had not had one of her medications before the last session that we had and she realized that she was probably having some withdrawal symptoms and that was making her much more negative and depressed.  The medicine that she did not have was Lyrica and she had been off of it for about a week and did not realize that it had run out.  We talked about the need to make sure that she continues to make sure she is taking her medications as prescribed and that medications do have withdrawal symptoms when you come off of them.  The patient seems to be in a much better head space and we talked a  little bit more about the need to begin doing some EMDR work around the trauma that she suffered as a kid.  We going to start looking at this and seeing if she is being at reactive as she was before and she says that she feels like she is not.  We will explore a little bit more about what it is that we need to target so that she can respond as opposed to react as much to things.    Interventions: Cognitive Behavioral Therapy, Mindfulness Meditation, Eye Movement Desensitization and Reprocessing (EMDR), Insight-Oriented, and Interpersonal, psychoeducation  Diagnosis:Major depressive disorder, recurrent episode, moderate (HCC)  PTSD (post-traumatic stress disorder)  Attention deficit hyperactivity disorder (ADHD), combined type  Plan: Plan of Care: Client Abilities/Strengths  Insightful, motivated, Supportive family Client Treatment Preferences  Outpatient Individual therapy/EMDR  Client Statement of Needs  I think I have PTSD and I feel like I need another kind of therapy than talk therapy Treatment Level  Outpatient Individual therapy  Symptoms  Demonstrates an exaggerated startle response.:  (Status: maintained). Depressed  or irritable mood.: (Status:maintained). Describes a reliving of the event,  particularly through dissociative flashbacks.: (Status: maintained). Displays a  significant decline in interest and engagement in activities.:  (Status: maintained). Displays significant psychological and/or physiological distress resulting from internal and external  clues that are reminiscent of the traumatic event.: (Status: maintained).  Experiences disturbances in sleep.:  (Status: maintained). Experiences disturbing  and persistent thoughts, images, and/or perceptions  of the traumatic event.:  (Status: maintained). Experiences frequent nightmares.: (Status: maintained).  Feelings of hopelessness, worthlessness, or inappropriate guilt.: No Description Entered (Status:  improved). Has  been exposed to a traumatic event involving actual or perceived threat of death or  serious injury.:  (Status: maintained). Impairment in social, occupational, or  other areas of functioning.:(Status: maintained). Intentionally avoids activities,  places, people, or objects (e.g., up-armored vehicles) that evoke memories of the event.: (Status: maintained). Intentionally avoids thoughts, feelings, or discussions related  to the traumatic event.: (Status: maintained). Reports difficulty concentrating as  well as feelings of guilt (Status:maintained). Reports response of intense fear,  helplessness, or horror to the traumatic event.:  (Status: maintained).  Problems Addressed  Unipolar Depression, Posttraumatic Stress Disorder (PTSD), Posttraumatic Stress Disorder (PTSD),   Posttraumatic Stress Disorder (PTSD), Posttraumatic Stress Disorder (PTSD)  Goals 1. Develop healthy thinking patterns and beliefs about self, others, and the world that lead to the alleviation and help prevent the relapse of  depression. Objective Identify and replace thoughts and beliefs that support depression. Target Date: 2024/01/04 Frequency: Weekly Progress: 60 Modality: individual Related Interventions 1. Explore and restructure underlying assumptions and beliefs reflected in biased self-talk that  may put the client at risk for relapse or recurrence. 2. Conduct Cognitive-Behavioral Therapy (see Cognitive Behavior Therapy by Almarie; Overcoming Depression by Marine dunker al.), beginning with helping the client learn the connection among  cognition, depressive feelings, and actions. 2. Eliminate or reduce the negative impact trauma related symptoms have  on social, occupational, and family functioning. Objective Learn and implement personal skills to manage challenging situations related to trauma. Target Date: 2024/01/04 Frequency: Weekly Progress: 0 Modality: individual 3. No longer avoids persons, places,  activities, and objects that are  reminiscent of the traumatic event. Objective Participate in Eye Movement Desensitization and Reprocessing (EMDR) to reduce emotional distress  related to traumatic thoughts, feelings, and images. Target Date: 2024/01/04 Frequency: Weekly Progress: 0 Modality: individual   Related Interventions 1. Utilize Eye Movement Desensitization and Reprocessing (EMDR) to reduce the client's  emotional reactivity to the traumatic event and reduce PTSD symptoms. Objective Learn and implement guided self-dialogue to manage thoughts, feelings, and urges brought on by  encounters with trauma-related situations. Target Date: 2025-11/07 Frequency: Weekly Progress: 10 Modality: individual Related Interventions 1. Teach the client a guided self-dialogue procedure in which he/she learns to recognize  maladaptive self-talk, challenges its biases, copes with engendered feelings, overcomes  avoidance, and reinforces his/her accomplishments; review and reinforce progress, problemsolve obstacles. 4. No longer experiences intrusive event recollections, avoidance of event  reminders, intense arousal, or disinterest in activities or  relationships. 5. Thinks about or openly discusses the traumatic event with others  without experiencing psychological or physiological distress. Diagnosis Axis  none F43.10 (Posttraumatic stress disorder) - Open - [Signifier: n/a] Posttraumatic Stress  Disorder  Conditions For Discharge Achievement of treatment goals and objectives   Rosio Weiss G Ismar Yabut, LCSW

## 2023-04-10 ENCOUNTER — Ambulatory Visit: Payer: Medicare Other | Admitting: Psychology

## 2023-04-12 ENCOUNTER — Ambulatory Visit: Payer: Medicare Other | Attending: Family Medicine

## 2023-04-12 DIAGNOSIS — M353 Polymyalgia rheumatica: Secondary | ICD-10-CM | POA: Diagnosis not present

## 2023-04-12 DIAGNOSIS — R293 Abnormal posture: Secondary | ICD-10-CM | POA: Insufficient documentation

## 2023-04-12 DIAGNOSIS — R252 Cramp and spasm: Secondary | ICD-10-CM | POA: Diagnosis not present

## 2023-04-12 DIAGNOSIS — M542 Cervicalgia: Secondary | ICD-10-CM | POA: Diagnosis not present

## 2023-04-12 DIAGNOSIS — M6281 Muscle weakness (generalized): Secondary | ICD-10-CM | POA: Diagnosis not present

## 2023-04-12 NOTE — Therapy (Signed)
OUTPATIENT PHYSICAL THERAPY TREATMENT     Patient Name: Danielle Bruce MRN: [id number] DOB:09-03-1960, 63 y.o., female Today's Date: 04/12/2023  PT End of Session - 04/12/23 1314     Visit Number 68    Date for PT Re-Evaluation 05/03/23    Authorization Type Medicare B- KX at 15 for 2025 (2/15)    Progress Note Due on Visit 78    PT Start Time 1235    PT Stop Time 1316    PT Time Calculation (min) 41 min    Activity Tolerance Patient tolerated treatment well    Behavior During Therapy [facility] for tasks assessed/performed                           PT End of Session - 04/12/23 1314     Visit Number 68    Date for PT Re-Evaluation 05/03/23    Authorization Type Medicare B- KX at 15 for 2025 (2/15)    Progress Note Due on Visit 78    PT Start Time 1235    PT Stop Time 1316    PT Time Calculation (min) 41 min    Activity Tolerance Patient tolerated treatment well    Behavior During Therapy [facility] for tasks assessed/performed                                                                      Past Medical History:  Diagnosis Date   Abdominal pain, unspecified site 10/18/2012   Anemia    Anxiety    Arthritis    osteoarthritis   ASCUS (atypical squamous cells of undetermined significance) on Pap smear 05/06/2005   NEG HIGH RISK HPV--C&B BIOPSY BENIGN 12/2005   Asthma    Bipolar 2 disorder (HCC)    Cancer (HCC)    skin cancer - basal cell   Colon polyps    Complication of anesthesia    anxious afterwards, will get headaches    Constipation    Depression    Fibromyalgia 10/2013   GERD (gastroesophageal reflux disease)    Hearing loss on left    Heart murmur    never had any problems   Hemorrhoids    High cholesterol    High risk HPV infection 08/2011   cytology negative   IBS (irritable bowel syndrome)    Insomnia    Lymphocytic colitis    Lymphoma (HCC)    MGUS (monoclonal gammopathy of  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   Bone marrow biopsy showes 8% plasma cells IgA Lambda   Migraines    Nausea alone 10/18/2012   Osteoarthritis    Osteopenia    Peripheral neuropathy    PTSD (post-traumatic stress disorder)    Past Surgical History:  Procedure Laterality Date   BONE MARROW BIOPSY Left 12/18/2013   Plasma cell dyscrasia 8% population of plasma cells   BREAST BIOPSY Right    benign stereo   CESAREAN SECTION  78,29,56   CHOLECYSTECTOMY N/A 10/16/2012   Procedure: LAPAROSCOPIC CHOLECYSTECTOMY;  Surgeon: Clovis Pu. Cornett, MD;  Location: [facility];  Service: General;  Laterality: N/A;   COLONOSCOPY     numerous times   DILATION AND CURETTAGE OF UTERUS     ESOPHAGOGASTRODUODENOSCOPY  HEMORRHOID SURGERY  1993   x3   IUD REMOVAL  02/2015   Mirena   LAPAROSCOPIC LYSIS OF ADHESIONS N/A 10/16/2012   Procedure: LAPAROSCOPIC LYSIS OF ADHESIONS;  Surgeon: Maisie Fus A. Cornett, MD;  Location: [facility];  Service: General;  Laterality: N/A;   LAPAROSCOPY N/A 10/16/2012   Procedure: LAPAROSCOPY DIAGNOSTIC;  Surgeon: Clovis Pu. Cornett, MD;  Location: [facility];  Service: General;  Laterality: N/A;   PELVIC LAPAROSCOPY     RADIOLOGY WITH ANESTHESIA N/A 12/16/2015   Procedure: MRI OF BRAIN WITH AND WITHOUT CONTRAST;  Surgeon: Medication Radiologist, MD;  Location: [facility];  Service: Radiology;  Laterality: N/A;   SHOULDER SURGERY  2007/2008   SPINE SURGERY  2010   cervical   Patient Active Problem List   Diagnosis Date Noted   GAD (generalized anxiety disorder) 12/26/2017   OCD (obsessive compulsive disorder) 12/26/2017   PTSD (post-traumatic stress disorder) 12/26/2017   DDD (degenerative disc disease), cervical 07/14/2016   Primary osteoarthritis of both feet 07/14/2016   Primary osteoarthritis of both hands 07/14/2016   Other fatigue 07/14/2016   History of IBS 07/14/2016   Osteopenia of multiple sites 07/14/2016   Fibromyalgia 01/13/2016   MGUS (monoclonal gammopathy of unknown  significance) 12/23/2013   Chronic cholecystitis without calculus 10/18/2012   Abdominal pain 10/18/2012   Nausea alone 10/18/2012   Constipation 10/18/2012   Depression 10/18/2012   Anxiety    ASCUS (atypical squamous cells of undetermined significance) on Pap smear    IUD    Hemorrhoids 11/29/2010   Abdominal pain, left upper quadrant 11/29/2010    PCP:  Lupita Raider, MD  REFERRING PROVIDER: Lupita Raider, MD  REFERRING DIAG: fibromyalgia, TMJ dysfunction (added 04/20/22)  THERAPY DIAG:  Abnormal posture  Muscle weakness (generalized)  Cramp and spasm  Polymyalgia rheumatica (HCC)  Cervicalgia  Rationale for Evaluation and Treatment Rehabilitation  ONSET DATE: chronic pain (fibromyalgia) with flare-up 2 months ago  SUBJECTIVE:                                                                                                                                                                                                         SUBJECTIVE STATEMENT:  I was sick with the flu so I missed my last appt.  I spent 8-9 days at my daughter's house and that went well.  I've done Yoga with the kids.      PERTINENT HISTORY:  Anxiety, bipolar disorder, depression, fibromyalgia, IBS, migraines, osteopenia, PTSD  PAIN: 04/12/23 Are you having pain: yes Pain location: neck/thoracic spine  Pain rating: 5/10 hands, 4/10 neck and thoracic  Pain description: irritating, annoying   Aggravating factors: doing too much Relieving factors: muscle relaxers, rest, stretching  PRECAUTIONS: Other: chronic pain syndrome and depression Other: slow progression with exercise due to chronic condition  WEIGHT BEARING RESTRICTIONS No  FALLS:  Has patient fallen in last 6 months? No  LIVING ENVIRONMENT: Lives with: lives with their family Lives in: House/apartment  OCCUPATION: on disability  PLOF: Independent with basic ADLs and Leisure: yardwork, walking  Pt cares for her 4 young  grandchildren- difficulty with care including lifting and carrying  PATIENT GOALS be more active with less pain, exercise at the gym regularly, lift and carry grandchildren and laundry, sleep with fewer interruptions, get back to more gardening.  OBJECTIVE:   DIAGNOSTIC FINDINGS: none recent   PATIENT SURVEYS:  The Patient-Specific Functional Scale  Initial:  I am going to ask you to identify up to 3 important activities that you are unable to do or are having difficulty with as a result of this problem.  Today are there any activities that you are unable to do or having difficulty with because of this?  (Patient shown scale and patient rated each activity)  Follow up: When you first came in you had difficulty performing these activities.  Today do you still have difficulty?  Patient-Specific activity scoring scheme (Point to one number):  0 1 2 3 4 5 6 7 8 9  10 Unable                                                                                                          Able to perform To perform                                                                                                    activity at the same Activity         Level as before                                                                                                                       Injury or problem  Activity  Lifting laundry and groceries                            Initial:       2  (11/22/22)              follow up:  2 (01/15/23)  follow up: 3 (03/08/23)  2.    Yard work- up and down with squatting          Initial:     4-5 (11/22/22)                  follow up: 7/10 (01/15/23)  follow-up: 7/10 (03/08/23)  3.    Drive long distances                                           Initial:   3-4 (11/22/22)                    follow up: 4/10 (01/15/23)  follow-up: not doing this    COGNITION: Overall cognitive status: Within functional limits for tasks assessed  SENSATION: WFL  POSTURE: rounded shoulders and  forward head  PALPATION: Diffuse palpable tenderness over bil neck, upper traps, thoracic, lumbar and gluteals with trigger points.    CERVICAL ROM:   Active ROM A/ROM 04/20/22 A/ROM 08/07/22  Flexion  60  Extension    Right lateral flexion 40 41  Left lateral flexion 45 50  Right rotation 65 70  Left rotation 65 75   (Blank rows = not tested) TMJ: opening limited by 25%, clicking with opening on the Rt.  Palpable tenderness over Rt masseter and at TMJ UPPER EXTREMITY ROM: UE A/ROM is limited by 20% into flexion and abduction with pain at end range.  Hip flexibility is limited by 25% in all directions with pain in all directions.  UPPER EXTREMITY MMT: UE: 4+/5, LE 4+/5 except hip flexors 4-/5  TODAY'S TREATMENT:  04/11/22: NuStep: Level 3 x 10 minutes for endurance and mobility-PT present to discuss progress. Ball roll outs forward and lateral x10 each Seated on green ball: 2# 3 way raises 2x10 Sit to stand with 5# and upright row: 2x10   Manual:  Elongation and release with cervical mobs   03/07/22: NuStep: Level 3 x 10 minutes for endurance and mobility-PT present to discuss progress. Ball roll outs forward and lateral x10 each Seated on green ball: 2# 3 way raises 2x10 Trigger Point Dry Needling  Subsequent Treatment: Instructions provided previously at initial dry needling treatment.   Patient Verbal Consent Given: Yes Education Handout Provided: Previously Provided Muscles Treated: bil neck, upper traps, thoracic multifidi Electrical Stimulation Performed: No Treatment Response/Outcome: elongation and release to treated muscles after DN  02/07/23: NuStep: Level 3 x 10 minutes for endurance and mobility-PT present to discuss progress. Ball roll outs forward and lateral x10 each Seated on green ball: 2# 3 way raises 2x10 Melt Method: all directions Open book x10 bil each Sit to stand with 5# and upright row: 2x10    HOME EXERCISE PROGRAM: Access Code:  [email or web address] Access Code: [email or web address] URL: [email or web address] Date: 09/06/2022 Prepared by: Tresa Endo                - Standing Hip Abduction with Counter Support  - 1 x daily - 7 x weekly - 1-2 sets -  10 reps - Standing Hip Extension with Chair  - 1 x daily - 7 x weekly - 1-2 sets - 10 reps ASSESSMENT:  CLINICAL IMPRESSION: Pt was sick and then visited her daughter for 9 days so has missed some PT.  She has been active with her grandchildren. Slow progress with PT due to external stressors and chronicity of pain condition in addition to other medical issues.  She hopes to be more active outdoors as the weather warms up.  Patient will benefit from skilled PT to address the below impairments and improve overall function.   OBJECTIVE IMPAIRMENTS decreased activity tolerance, decreased endurance, difficulty walking, decreased strength, increased muscle spasms, impaired flexibility, improper body mechanics, postural dysfunction, and pain.   ACTIVITY LIMITATIONS carrying, lifting, sitting, standing, reach over head, hygiene/grooming, and locomotion level  PARTICIPATION LIMITATIONS: meal prep, cleaning, laundry, driving, community activity, and yard work  PERSONAL FACTORS Past/current experiences, Time since onset of injury/illness/exacerbation, and 3+ comorbidities: fibromyalgia depression, anxiety,   are also affecting patient's functional outcome.   REHAB POTENTIAL: Good  CLINICAL DECISION MAKING: Evolving/moderate complexity  EVALUATION COMPLEXITY: Moderate   GOALS: Goals reviewed with patient? Yes  SHORT TERM GOALS: Target date: 12/22/22  Return to regular performance of HEP 4-5x/week to improve mobility  Baseline: met since last visit (12/06/22) Goal status: In progress  2.  Rate lifting (groceries and laundry) > or = to 3/10 on patient specific functional scale (PSFS) Baseline: 2/10 Goal status: NEW  3.  Initiate walking program for exercise > or = to 2x/wk Baseline:  not walking  Goal status: NEW    LONG TERM GOALS: Target date: 05/02/23  Be independent in advanced HEP Baseline: exercising when pain and mental health allows, PT assessing every 3 weeks (11/22/22) Goal status: In progress   2.  Rate squatting for yard work > or = to 6/10 on PSFS Baseline: 7/10 (01/15/23) Goal status: MET  3.  Rate driving > or = to 9/56 on PSFS Baseline: hasn't been driving, taking train for long trips (03/08/23) Goal status: in progress    4.  Walk for exercise and perform regular exercises 3-5x/wk for exercise to improve endurance  Baseline: not walking due to cold weather, doing exercises 3x/wk (03/08/23) Goal status: In progress    5.  Rate lifting (groceries and laundry) > or = to 4/10 on PSFS  Baseline: 3/10  (03/08/23) Goal status: in progress    PLAN: PT FREQUENCY: every other week  PT DURATION: 8 weeks  PLANNED INTERVENTIONS: Therapeutic exercises, Therapeutic activity, Neuromuscular re-education, Balance training, Gait training, Patient/Family education, Self Care, Joint mobilization, Aquatic Therapy, Dry Needling, Spinal manipulation, Spinal mobilization, Cryotherapy, Moist heat, Taping, Traction, Manual therapy, and Re-evaluation  PLAN FOR NEXT SESSION:  1 visit every 2-3 weeks- modify HEP, manual for pain  Lorrene Reid, PT 04/12/23 1:16 PM

## 2023-04-12 NOTE — Patient Instructions (Addendum)
Marland Kitchen

## 2023-04-17 ENCOUNTER — Ambulatory Visit (INDEPENDENT_AMBULATORY_CARE_PROVIDER_SITE_OTHER): Payer: Medicare Other | Admitting: Psychology

## 2023-04-17 DIAGNOSIS — F902 Attention-deficit hyperactivity disorder, combined type: Secondary | ICD-10-CM | POA: Diagnosis not present

## 2023-04-17 DIAGNOSIS — F4310 Post-traumatic stress disorder, unspecified: Secondary | ICD-10-CM | POA: Diagnosis not present

## 2023-04-17 DIAGNOSIS — F428 Other obsessive-compulsive disorder: Secondary | ICD-10-CM | POA: Diagnosis not present

## 2023-04-17 DIAGNOSIS — F331 Major depressive disorder, recurrent, moderate: Secondary | ICD-10-CM | POA: Diagnosis not present

## 2023-04-17 NOTE — Progress Notes (Unsigned)
                edge,  experiencing concentration difficulties, having trouble falling or staying asleep, exhibiting a general  state of irritability).: No Description Entered (Status: improved). Motor tension (e.g., restlessness,  tiredness, shakiness, muscle tension).: No Description Entered (Status: improved).  Problems Addressed  Anxiety, Phase Of Life Problems, Anxiety  Goals 1. Learn and implement coping skills that result in a reduction of anxiety  and worry, and improved daily functioning. Objective Learn  and implement calming skills to reduce overall anxiety and manage anxiety symptoms. Target Date: 2025-08-09Frequency: Weekly Progress: 40 Modality: individual  Related Interventions 1. Teach the client calming/relaxation skills (e.g., applied relaxation, progressive muscle  relaxation, cue controlled relaxation; mindful breathing; biofeedback) and how to discriminate  better between relaxation and tension; teach the client how to apply these skills to his/her daily  life (e.g., New Directions in Progressive Muscle Relaxation by Marcelyn Ditty, and  Hazlett-Stevens; Treating Generalized Anxiety Disorder by Rygh and Ida Rogue). Objective Identify, challenge, and replace biased, fearful self-talk with positive, realistic, and empowering selftalk. Target Date: 2023-10-06 Frequency: weekly Progress: 30 Modality: individual Related Interventions 1. Explore the client's schema and self-talk that mediate his/her fear response; assist him/her in  challenging the biases; replace the distorted messages with reality-based alternatives and  positive, realistic self-talk that will increase his/her self-confidence in coping with irrational  fears (see Cognitive Therapy of Anxiety Disorders by Laurence Slate). Objective Learn and implement problem-solving strategies for realistically addressing worries. Target Date: 2025-08-09Frequency: weekly Progress: 40 Modality: individual 2. Resolve conflicted feelings and adapt to the new life circumstances. Objective Apply problem-solving skills to current circumstances. Target Date: 2023-10-06 Frequency: weekly Progress: 20 Modality: individual Related Interventions 1. Teach the client problem-resolution skills (e.g., defining the problem clearly, brainstorming  multiple solutions, listing the pros and cons of each solution, seeking input from others,  selecting and implementing a plan of action, evaluating outcome, and readjusting plan as   necessary).   3. Stabilize anxiety level while increasing ability to function on a daily  basis. Diagnosis F33.1  Major depressive disorder, moderate 300.02 (Generalized anxiety disorder) - Open - [Signifier: n/a]  Axis  none 309.28 (Adjustment disorder with mixed anxiety and depressed  mood) - Open - [Signifier: n/a]  Adjustment Disorder,  With Anxiety   Marital conflict  Major Depressive disorder, moderate  Conditions For Discharge Achievement of treatment goals and objectives.  The patient approved this plan.   Deonna Krummel G Ethridge Sollenberger, LCSW

## 2023-04-23 DIAGNOSIS — J45909 Unspecified asthma, uncomplicated: Secondary | ICD-10-CM | POA: Diagnosis not present

## 2023-04-23 DIAGNOSIS — M797 Fibromyalgia: Secondary | ICD-10-CM | POA: Diagnosis not present

## 2023-04-23 DIAGNOSIS — F900 Attention-deficit hyperactivity disorder, predominantly inattentive type: Secondary | ICD-10-CM | POA: Diagnosis not present

## 2023-04-23 DIAGNOSIS — G43909 Migraine, unspecified, not intractable, without status migrainosus: Secondary | ICD-10-CM | POA: Diagnosis not present

## 2023-04-23 DIAGNOSIS — M26609 Unspecified temporomandibular joint disorder, unspecified side: Secondary | ICD-10-CM | POA: Diagnosis not present

## 2023-04-23 DIAGNOSIS — G4700 Insomnia, unspecified: Secondary | ICD-10-CM | POA: Diagnosis not present

## 2023-04-23 DIAGNOSIS — F411 Generalized anxiety disorder: Secondary | ICD-10-CM | POA: Diagnosis not present

## 2023-04-23 DIAGNOSIS — J309 Allergic rhinitis, unspecified: Secondary | ICD-10-CM | POA: Diagnosis not present

## 2023-04-24 ENCOUNTER — Ambulatory Visit: Payer: Medicare Other | Admitting: Psychology

## 2023-04-24 DIAGNOSIS — F4310 Post-traumatic stress disorder, unspecified: Secondary | ICD-10-CM | POA: Diagnosis not present

## 2023-04-24 DIAGNOSIS — F428 Other obsessive-compulsive disorder: Secondary | ICD-10-CM | POA: Diagnosis not present

## 2023-04-24 DIAGNOSIS — F331 Major depressive disorder, recurrent, moderate: Secondary | ICD-10-CM | POA: Diagnosis not present

## 2023-04-24 DIAGNOSIS — F902 Attention-deficit hyperactivity disorder, combined type: Secondary | ICD-10-CM | POA: Diagnosis not present
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
## 2023-04-29 NOTE — Progress Notes (Deleted)
 Follow Up Note  RE: Danielle Bruce MRN: [id number] DOB: [birth year] Date of Office Visit: 04/30/2023  Referring provider: Lupita Raider, MD Primary care provider: Lupita Raider, MD  Chief Complaint: No chief complaint on file.  History of Present Illness: I had the pleasure of seeing Danielle Bruce for a follow up visit at the [facility] on 04/29/2023. She is a 63 y.o. female, who is being followed for allergic rhinoconjunctivitis, reactive airway disease, contact dermatitis, GERD. Her previous allergy office visit was on 11/01/2022 with Dr. Selena Batten. Today is a regular follow up visit.  Discussed the use of AI scribe software for clinical note transcription with the patient, who gave verbal consent to proceed.  History of Present Illness            Bloodwork positive to dogs only. Let us know if you are interested in starting shots for dog.  Assessment and Plan: Elverna is a 63 y.o. female with: Other allergic rhinitis Allergic conjunctivitis of both eyes Rhino conjunctivitis symptoms which flare around cats and dogs. On AIT in the past with some benefit. 2021 skin testing only positive to dog. Apparently she had a lot of bleeding from the allergy testing? Records requested from prior allergist and reviewed. Get bloodwork and will make additional recommendations based on results.  Use over the counter antihistamines such as Zyrtec (cetirizine), Claritin (loratadine), Allegra (fexofenadine), or Xyzal (levocetirizine) daily as needed. May take twice a day during allergy flares. May switch antihistamines every few months. Use Flonase (fluticasone) nasal spray 1-2 sprays per nostril once a day as needed for nasal congestion.  Use azelastine nasal spray 1-2 sprays per nostril twice a day as needed for runny nose/drainage. Nasal saline spray (i.e., Simply Saline) or nasal saline lavage (i.e., NeilMed) is recommended as needed and prior to medicated nasal sprays. Use  olopatadine eye drops 0.2% once a day as needed for itchy/watery eyes.   Mild intermittent reactive airway disease without complication Usually flares with URI. Had prednisone last year. Currently not on any daily inhalers but apparently was prescribed Breo in the past.  Today's spirometry was normal. May use albuterol rescue inhaler 2 puffs every 4 to 6 hours as needed for shortness of breath, chest tightness, coughing, and wheezing.  Monitor frequency of use - if you need to use it more than twice per week on a consistent basis let us know.    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 See below for proper skin care.    GERD Continue lifestyle and dietary modifications.   Lactose intolerance May use lactose free milk or take a lactaid pill right before consuming anything with dairy. Assessment and Plan              No follow-ups on file.  No orders of the defined types were placed in this encounter.  Lab Orders  No laboratory test(s) ordered today    Diagnostics: Spirometry:  Tracings reviewed. Her effort: {Blank single:19197::"Good reproducible efforts.","It was hard to get consistent efforts and there is a question as to whether this reflects a maximal maneuver.","Poor effort, data can not be interpreted."} FVC: ***L FEV1: ***L, ***% predicted FEV1/FVC ratio: ***% Interpretation: {Blank single:19197::"Spirometry consistent with mild obstructive disease","Spirometry consistent with moderate obstructive disease","Spirometry consistent with severe obstructive disease","Spirometry consistent with possible restrictive disease","Spirometry consistent with mixed obstructive and restrictive disease","Spirometry uninterpretable due to technique","Spirometry consistent with normal pattern","No overt abnormalities noted given today's efforts"}.  Please see scanned spirometry results for details.  Skin Testing: {Blank  single:19197::"Select foods","Environmental allergy panel","Environmental allergy panel and select foods","Food allergy panel","None","Deferred due to recent antihistamines use"}. *** Results discussed with patient/family.   Medication List:  Current Outpatient Medications  Medication Sig Dispense Refill   albuterol (PROVENTIL HFA;VENTOLIN HFA) 108 (90 Base) MCG/ACT inhaler Inhale 2 puffs into the lungs every 6 (six) hours as needed for wheezing or shortness of breath.     amphetamine-dextroamphetamine (ADDERALL XR) 20 MG 24 hr capsule Take 1 capsule (20 mg total) by mouth daily. 30 capsule 0   azelastine (ASTELIN) 0.1 % nasal spray Place 1-2 sprays into both nostrils 2 (two) times daily as needed (nasal drainage). Use in each nostril as directed 30 mL 3   B-12, Methylcobalamin, 1000 MCG SUBL Place 1,000 mcg under the tongue every 30 (thirty) days. 30 tablet 1   Calcium Carb-Cholecalciferol (CALCIUM 1000 + D) 1000-20 MG-MCG TABS Take by mouth.     cetirizine (ZYRTEC) 10 MG tablet Take 1 tablet (10 mg total) by mouth daily. 30 tablet 11   cyclobenzaprine (FLEXERIL) 10 MG tablet Take 10 mg by mouth at bedtime as needed.     diphenhydrAMINE (BENADRYL) 25 mg capsule Take 50 mg by mouth every 6 (six) hours as needed (HEADACHES).     EPINEPHrine (EPIPEN 2-PAK) 0.3 mg/0.3 mL IJ SOAJ injection Inject 0.3 mg into the muscle as needed for anaphylaxis. 0.3 mL 1   Erenumab-aooe 70 MG/ML SOAJ Inject into the skin every 28 (twenty-eight) days.     fluticasone (FLONASE) 50 MCG/ACT nasal spray Place 1-2 sprays into both nostrils daily as needed (nasal congestion). 16 g 3   fluvoxaMINE (LUVOX) 25 MG tablet Take 3 tablets (75 mg total) by mouth at bedtime. 90 tablet 1   hydrocortisone (ANUSOL-HC) 25 MG suppository 1 suppository     ibuprofen (ADVIL) 800 MG tablet 1 tablet with food or milk as needed     L-Methylfolate 15 MG TABS TAKE 1 TABLET BY MOUTH DAILY 30 tablet 11   levocetirizine (XYZAL) 5 MG tablet Take  5 mg by mouth every evening.  5   LORazepam (ATIVAN) 1 MG tablet Take 1 tablet (1 mg total) by mouth every 8 (eight) hours as needed for anxiety. (takes 2 mg when having a medical procedure.) 30 tablet 1   metoprolol tartrate (LOPRESSOR) 50 MG tablet Take 1 tablet (50 mg total) by mouth once for 1 dose. 1 tablet 0   NURTEC 75 MG TBDP Take 1 tablet by mouth daily as needed.     Olopatadine HCl 0.2 % SOLN Apply 1 drop to eye daily as needed (itchy/watery eyes). 2.5 mL 5   oxyCODONE-acetaminophen (PERCOCET/ROXICET) 5-325 MG per tablet Take 2 tablets by mouth daily as needed for moderate pain or severe pain (mighraine.).      polyethylene glycol (MIRALAX / GLYCOLAX) packet Take 17 g by mouth daily as needed for mild constipation.     pregabalin (LYRICA) 50 MG capsule Take 100 mg by mouth 3 (three) times daily.     PROLIA 60 MG/ML SOSY injection BRING TO THE OFFICE FOR INJECTION ON 02/15/2018 AS DIRECTED ONCE EVERY 6 MONTHS     promethazine (PHENERGAN) 25 MG tablet Take 25 mg by mouth every 6 (six) hours as needed for nausea.     rosuvastatin (CRESTOR) 10 MG tablet Take 1 tablet (10 mg total) by mouth daily. 90 tablet 3   traZODone (DESYREL) 50 MG tablet TAKE 1 TABLET(50 MG) BY  MOUTH AT BEDTIME 30 tablet 2   VASCEPA 1 g CAPS TK 2 CS PO BID WC  3   VITAMIN D PO Take by mouth. Take 5000mg  daily     VOLTAREN 1 % GEL Apply 2 g topically 4 (four) times daily as needed (JOINT PAIN).   3   Wheat Dextrin (BENEFIBER PO) 1 Tablespoon     No current facility-administered medications for this visit.   Allergies: Allergies  Allergen Reactions   Sulfa Antibiotics Nausea And Vomiting    Causes pt to vomit blood Other reaction(s): vomiting blood   Gluten Meal Other (See Comments)    Sensitivity.    Lactose Intolerance (Gi) Nausea And Vomiting   Aspirin     Other reaction(s): colitis   Bromfed Dm [Pseudoeph-Bromphen-Dm]     Other reaction(s): strung out and hallucinating   Chlorpromazine     Other  reaction(s): Dark thoughts   Desipramine     Other reaction(s): rash   Gabapentin     Other reaction(s): swelling / fuzzy vision   Hydrocodone     Other reaction(s): causes hangovers   Lamotrigine     Other reaction(s): rash   Metronidazole     Other reaction(s): vomiting blood   Morphine Sulfate     Other reaction(s): tearful   Nsaids     Other reaction(s): colitis   Flagyl [Metronidazole Hcl] Nausea And Vomiting   Morphine And Codeine Other (See Comments)    Reaction: Depression, emotional   I reviewed her past medical history, social history, family history, and environmental history and no significant changes have been reported from her previous visit.  Review of Systems  Constitutional:  Negative for appetite change, chills, fever and unexpected weight change.  HENT:  Positive for congestion, postnasal drip and rhinorrhea.   Eyes:  Negative for itching.  Respiratory:  Negative for cough, chest tightness, shortness of breath and wheezing.   Cardiovascular:  Negative for chest pain.  Gastrointestinal:  Negative for abdominal pain.  Genitourinary:  Negative for difficulty urinating.  Skin:  Positive for rash.  Neurological:  Positive for headaches.    Objective: LMP 12/28/2010  There is no height or weight on file to calculate BMI. Physical Exam Vitals and nursing note reviewed.  Constitutional:      Appearance: Normal appearance. She is well-developed.  HENT:     Head: Normocephalic and atraumatic.     Right Ear: Tympanic membrane and external ear normal.     Left Ear: Tympanic membrane and external ear normal.     Nose: Nose normal.     Mouth/Throat:     Mouth: Mucous membranes are moist.     Pharynx: Oropharynx is clear.  Eyes:     Conjunctiva/sclera: Conjunctivae normal.  Cardiovascular:     Rate and Rhythm: Normal rate and regular rhythm.     Heart sounds: Normal heart sounds. No murmur heard.    No friction rub. No gallop.  Pulmonary:     Effort:  Pulmonary effort is normal.     Breath sounds: Normal breath sounds. No wheezing, rhonchi or rales.  Musculoskeletal:     Cervical back: Neck supple.  Skin:    General: Skin is warm.     Findings: No rash.  Neurological:     Mental Status: She is alert and oriented to person, place, and time.  Psychiatric:        Behavior: Behavior normal.    Previous notes and tests were reviewed. The plan was reviewed  with the patient/family, and all questions/concerned were addressed.  It was my pleasure to see Leiya today and participate in her care. Please feel free to contact me with any questions or concerns.  Sincerely,  Wyline Mood, DO Allergy & Immunology  [facility] office: [phone] [facility] office: [phone]

## 2023-04-30 ENCOUNTER — Ambulatory Visit: Payer: Medicare Other | Admitting: Allergy

## 2023-04-30 ENCOUNTER — Ambulatory Visit: Payer: Medicare Other | Attending: Family Medicine

## 2023-04-30 DIAGNOSIS — M6281 Muscle weakness (generalized): Secondary | ICD-10-CM | POA: Insufficient documentation

## 2023-04-30 DIAGNOSIS — R252 Cramp and spasm: Secondary | ICD-10-CM | POA: Diagnosis not present

## 2023-04-30 DIAGNOSIS — M542 Cervicalgia: Secondary | ICD-10-CM | POA: Insufficient documentation

## 2023-04-30 DIAGNOSIS — M353 Polymyalgia rheumatica: Secondary | ICD-10-CM | POA: Insufficient documentation

## 2023-04-30 DIAGNOSIS — R293 Abnormal posture: Secondary | ICD-10-CM | POA: Diagnosis not present

## 2023-04-30 NOTE — Therapy (Signed)
 OUTPATIENT PHYSICAL THERAPY TREATMENT     Patient Name: Danielle Bruce MRN: [id number] DOB:19-Jul-1960, 63 y.o., female Today's Date: 04/30/2023  PT End of Session - 04/30/23 1222     Visit Number 69    Date for PT Re-Evaluation 07/27/23    Authorization Type Medicare B- KX at 15 for 2025 (3/15)    Progress Note Due on Visit 78    PT Start Time 1147   short session due to migraine   PT Stop Time 1221    PT Time Calculation (min) 34 min    Activity Tolerance Patient tolerated treatment well    Behavior During Therapy [facility] for tasks assessed/performed               Past Medical History:  Diagnosis Date   Abdominal pain, unspecified site 10/18/2012   Anemia    Anxiety    Arthritis    osteoarthritis   ASCUS (atypical squamous cells of undetermined significance) on Pap smear 05/06/2005   NEG HIGH RISK HPV--C&B BIOPSY BENIGN 12/2005   Asthma    Bipolar 2 disorder (HCC)    Cancer (HCC)    skin cancer - basal cell   Colon polyps    Complication of anesthesia    anxious afterwards, will get headaches    Constipation    Depression    Fibromyalgia 10/2013   GERD (gastroesophageal reflux disease)    Hearing loss on left    Heart murmur    never had any problems   Hemorrhoids    High cholesterol    High risk HPV infection 08/2011   cytology negative   IBS (irritable bowel syndrome)    Insomnia    Lymphocytic colitis    Lymphoma (HCC)    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   Bone marrow biopsy showes 8% plasma cells IgA Lambda   Migraines    Nausea alone 10/18/2012   Osteoarthritis    Osteopenia    Peripheral neuropathy    PTSD (post-traumatic stress disorder)    Past Surgical History:  Procedure Laterality Date   BONE MARROW BIOPSY Left 12/18/2013   Plasma cell dyscrasia 8% population of plasma cells   BREAST BIOPSY Right    benign stereo   CESAREAN SECTION  40,98,11   CHOLECYSTECTOMY N/A 10/16/2012   Procedure: LAPAROSCOPIC  CHOLECYSTECTOMY;  Surgeon: Clovis Pu. Cornett, MD;  Location: [facility];  Service: General;  Laterality: N/A;   COLONOSCOPY     numerous times   DILATION AND CURETTAGE OF UTERUS     ESOPHAGOGASTRODUODENOSCOPY     HEMORRHOID SURGERY  1993   x3   IUD REMOVAL  02/2015   Mirena   LAPAROSCOPIC LYSIS OF ADHESIONS N/A 10/16/2012   Procedure: LAPAROSCOPIC LYSIS OF ADHESIONS;  Surgeon: Maisie Fus A. Cornett, MD;  Location: [facility];  Service: General;  Laterality: N/A;   LAPAROSCOPY N/A 10/16/2012   Procedure: LAPAROSCOPY DIAGNOSTIC;  Surgeon: Clovis Pu. Cornett, MD;  Location: [facility];  Service: General;  Laterality: N/A;   PELVIC LAPAROSCOPY     RADIOLOGY WITH ANESTHESIA N/A 12/16/2015   Procedure: MRI OF BRAIN WITH AND WITHOUT CONTRAST;  Surgeon: Medication Radiologist, MD;  Location: [facility];  Service: Radiology;  Laterality: N/A;   SHOULDER SURGERY  2007/2008   SPINE SURGERY  2010   cervical   Patient Active Problem List   Diagnosis Date Noted   GAD (generalized anxiety disorder) 12/26/2017   OCD (obsessive compulsive disorder) 12/26/2017   PTSD (post-traumatic stress disorder) 12/26/2017  DDD (degenerative disc disease), cervical 07/14/2016   Primary osteoarthritis of both feet 07/14/2016   Primary osteoarthritis of both hands 07/14/2016   Other fatigue 07/14/2016   History of IBS 07/14/2016   Osteopenia of multiple sites 07/14/2016   Fibromyalgia 01/13/2016   MGUS (monoclonal gammopathy of unknown significance) 12/23/2013   Chronic cholecystitis without calculus 10/18/2012   Abdominal pain 10/18/2012   Nausea alone 10/18/2012   Constipation 10/18/2012   Depression 10/18/2012   Anxiety    ASCUS (atypical squamous cells of undetermined significance) on Pap smear    IUD    Hemorrhoids 11/29/2010   Abdominal pain, left upper quadrant 11/29/2010    PCP:  Lupita Raider, MD  REFERRING PROVIDER: Lupita Raider, MD  REFERRING DIAG: fibromyalgia, TMJ dysfunction (added 04/20/22)  THERAPY DIAG:   Abnormal posture - Plan: PT plan of care cert/re-cert  Muscle weakness (generalized) - Plan: PT plan of care cert/re-cert  Cramp and spasm - Plan: PT plan of care cert/re-cert  Polymyalgia rheumatica (HCC) - Plan: PT plan of care cert/re-cert  Cervicalgia - Plan: PT plan of care cert/re-cert  Rationale for Evaluation and Treatment Rehabilitation  ONSET DATE: chronic pain (fibromyalgia) with flare-up 2 months ago  SUBJECTIVE:                                                                                                                                                                                                         SUBJECTIVE STATEMENT:  I spent the past week at my daughter's house and was very active.  I was tired but I did well.  I did my stretching.  I'm getting a migraine right now.  I want to get home and take my medication.     PERTINENT HISTORY:  Anxiety, bipolar disorder, depression, fibromyalgia, IBS, migraines, osteopenia, PTSD  PAIN: 04/30/23 Are you having pain: yes Pain location: neck/thoracic spine   Pain rating: neck 5/10, back 4/10 Pain description: irritating, annoying   Aggravating factors: doing too much Relieving factors: muscle relaxers, rest, stretching  PRECAUTIONS: Other: chronic pain syndrome and depression Other: slow progression with exercise due to chronic condition  WEIGHT BEARING RESTRICTIONS No  FALLS:  Has patient fallen in last 6 months? No  LIVING ENVIRONMENT: Lives with: lives with their family Lives in: House/apartment  OCCUPATION: on disability  PLOF: Independent with basic ADLs and Leisure: yardwork, walking  Pt cares for her 4 young grandchildren- difficulty with care including lifting and carrying  PATIENT GOALS be more active with less pain, exercise at the gym regularly, lift and carry grandchildren  and laundry, sleep with fewer interruptions, get back to more gardening.  OBJECTIVE:   DIAGNOSTIC FINDINGS: none  recent   PATIENT SURVEYS:  The Patient-Specific Functional Scale  Initial:  I am going to ask you to identify up to 3 important activities that you are unable to do or are having difficulty with as a result of this problem.  Today are there any activities that you are unable to do or having difficulty with because of this?  (Patient shown scale and patient rated each activity)  Follow up: When you first came in you had difficulty performing these activities.  Today do you still have difficulty?  Patient-Specific activity scoring scheme (Point to one number):  0 1 2 3 4 5 6 7 8 9  10 Unable                                                                                                          Able to perform To perform                                                                                                    activity at the same Activity         Level as before                                                                                                                       Injury or problem  Activity   Lifting laundry and groceries                           follow up:  2 (01/15/23)  follow up: 3 (03/08/23)   follow up: 5/10 (3/325)  2.    Yard work- up and down with squatting           follow up: 7/10 (01/15/23)  follow-up: 7/10 (03/08/23)  follow-up: 7/10 no change due to winter weather (04/30/23)     3.    Drive long distances  follow up: 4/10 (01/15/23)    follow-up: not doing this    (03/08/23)    follow-up: not doing this, short distance driving is 1/61, long distance 4/10 (04/30/23)    COGNITION: Overall cognitive status: Within functional limits for tasks assessed  SENSATION: WFL  POSTURE: rounded shoulders and forward head  PALPATION: Diffuse palpable tenderness over bil neck, upper traps, thoracic, lumbar and gluteals with trigger points.    CERVICAL ROM:   Active ROM A/ROM 04/20/22 A/ROM 08/07/22  Flexion  60  Extension     Right lateral flexion 40 41  Left lateral flexion 45 50  Right rotation 65 70  Left rotation 65 75   (Blank rows = not tested) TMJ: opening limited by 25%, clicking with opening on the Rt.  Palpable tenderness over Rt masseter and at TMJ UPPER EXTREMITY ROM: UE A/ROM is limited by 20% into flexion and abduction with pain at end range.  Hip flexibility is limited by 25% in all directions with pain in all directions.  UPPER EXTREMITY MMT: UE: 4+/5, LE 4+/5 except hip flexors 4-/5  TODAY'S TREATMENT:  04/30/23: Self care: education regarding stress management and avoidance of parafunction that will aggravate her jaw and neck/head Manual:  Elongation and release with cervical mobs- tender on the Rt   04/11/22: NuStep: Level 3 x 10 minutes for endurance and mobility-PT present to discuss progress. Ball roll outs forward and lateral x10 each Seated on green ball: 2# 3 way raises 2x10 Sit to stand with 5# and upright row: 2x10   Manual:  Elongation and release with cervical mobs   03/07/22: NuStep: Level 3 x 10 minutes for endurance and mobility-PT present to discuss progress. Ball roll outs forward and lateral x10 each Seated on green ball: 2# 3 way raises 2x10 Trigger Point Dry Needling  Subsequent Treatment: Instructions provided previously at initial dry needling treatment.   Patient Verbal Consent Given: Yes Education Handout Provided: Previously Provided Muscles Treated: bil neck, upper traps, thoracic multifidi Electrical Stimulation Performed: No Treatment Response/Outcome: elongation and release to treated muscles after DN    HOME EXERCISE PROGRAM: Access Code: [email or web address] Access Code: [email or web address] URL: [email or web address] Date: 09/06/2022 Prepared by: Tresa Endo                - Standing Hip Abduction with Counter Support  - 1 x daily - 7 x weekly - 1-2 sets - 10 reps - Standing Hip Extension with Chair  - 1 x daily - 7 x weekly - 1-2 sets - 10  reps ASSESSMENT:  CLINICAL IMPRESSION: Pt arrived with the beginning of a migraine today.  Overall, she reports that he is able to be more functional with her home tasks and care of her grandchildren.  Lifting laundry and groceries is improved on PSFS to 5/10  She hopes to be more active outdoors as the weather warms up. She is stronger with functional tasks.  Session was shortened today due to migraine and pt wanted to get home to take her medication.  Patient will benefit from skilled PT to address the below impairments and improve overall function.   OBJECTIVE IMPAIRMENTS decreased activity tolerance, decreased endurance, difficulty walking, decreased strength, increased muscle spasms, impaired flexibility, improper body mechanics, postural dysfunction, and pain.   ACTIVITY LIMITATIONS carrying, lifting, sitting, standing, reach over head, hygiene/grooming, and locomotion level  PARTICIPATION LIMITATIONS: meal prep, cleaning, laundry, driving, community activity, and yard work  PERSONAL FACTORS Past/current experiences, Time since onset of injury/illness/exacerbation, and 3+ comorbidities: fibromyalgia  depression, anxiety,   are also affecting patient's functional outcome.   REHAB POTENTIAL: Good  CLINICAL DECISION MAKING: Evolving/moderate complexity  EVALUATION COMPLEXITY: Moderate   GOALS: Goals reviewed with patient? Yes  SHORT TERM GOALS: Target date: 12/22/22  Return to regular performance of HEP 4-5x/week to improve mobility  Baseline: met since last visit (12/06/22) Goal status: In progress  2.  Rate lifting (groceries and laundry) > or = to 3/10 on patient specific functional scale (PSFS) Baseline: 2/10 Goal status: NEW  3.  Initiate walking program for exercise > or = to 2x/wk Baseline: not walking  Goal status: NEW    LONG TERM GOALS: Target date: 07/27/23  Be independent in advanced HEP Baseline: exercising when pain and mental health allows, PT assessing every  3 weeks (04/30/23) Goal status: In progress   2.  Rate squatting for yard work > or = to 8/10 on PSFS Baseline: 7/10 (04/30/23) Goal status: Revised   3.  Rate driving > or = to 6/21 on PSFS Baseline: hasn't been driving, taking train for long trips (03/08/23) Goal status: in progress    4.  Walk for exercise and perform regular exercises 3-5x/wk for exercise to improve endurance  Baseline: active with her grandchildren, walks as weather arrives (04/30/23) Goal status: In progress    5.  Rate lifting (groceries and laundry) > or = to 6/10 on PSFS  Baseline: 5/10  (04/30/23) Goal status: revised     PLAN: PT FREQUENCY: every other week  PT DURATION: 8 weeks  PLANNED INTERVENTIONS: Therapeutic exercises, Therapeutic activity, Neuromuscular re-education, Balance training, Gait training, Patient/Family education, Self Care, Joint mobilization, Aquatic Therapy, Dry Needling, Spinal manipulation, Spinal mobilization, Cryotherapy, Moist heat, Taping, Traction, Manual therapy, and Re-evaluation  PLAN FOR NEXT SESSION:  1 visit every 2-3 weeks- modify HEP, manual for pain  Lorrene Reid, PT 04/30/23 12:38 PM

## 2023-05-01 ENCOUNTER — Ambulatory Visit: Payer: Medicare Other | Admitting: Psychology

## 2023-05-07 ENCOUNTER — Other Ambulatory Visit: Payer: Self-pay | Admitting: Gastroenterology

## 2023-05-07 DIAGNOSIS — K449 Diaphragmatic hernia without obstruction or gangrene: Secondary | ICD-10-CM

## 2023-05-07 DIAGNOSIS — R1013 Epigastric pain: Secondary | ICD-10-CM

## 2023-05-08 ENCOUNTER — Ambulatory Visit (INDEPENDENT_AMBULATORY_CARE_PROVIDER_SITE_OTHER): Payer: Medicare Other | Admitting: Psychology

## 2023-05-08 DIAGNOSIS — F428 Other obsessive-compulsive disorder: Secondary | ICD-10-CM | POA: Diagnosis not present

## 2023-05-08 DIAGNOSIS — F4310 Post-traumatic stress disorder, unspecified: Secondary | ICD-10-CM

## 2023-05-08 DIAGNOSIS — F331 Major depressive disorder, recurrent, moderate: Secondary | ICD-10-CM

## 2023-05-08 DIAGNOSIS — F902 Attention-deficit hyperactivity disorder, combined type: Secondary | ICD-10-CM | POA: Diagnosis not present

## 2023-05-09 DIAGNOSIS — R12 Heartburn: Secondary | ICD-10-CM | POA: Diagnosis not present

## 2023-05-09 DIAGNOSIS — R634 Abnormal weight loss: Secondary | ICD-10-CM | POA: Diagnosis not present

## 2023-05-09 DIAGNOSIS — R1084 Generalized abdominal pain: Secondary | ICD-10-CM | POA: Diagnosis not present

## 2023-05-09 DIAGNOSIS — R110 Nausea: Secondary | ICD-10-CM | POA: Diagnosis not present

## 2023-05-09 DIAGNOSIS — K5900 Constipation, unspecified: Secondary | ICD-10-CM | POA: Diagnosis not present

## 2023-05-09 DIAGNOSIS — R140 Abdominal distension (gaseous): Secondary | ICD-10-CM | POA: Diagnosis not present

## 2023-05-09 DIAGNOSIS — R142 Eructation: Secondary | ICD-10-CM | POA: Diagnosis not present

## 2023-05-09 NOTE — Progress Notes (Signed)
 [facility] Counselor/Therapist Progress Note  Patient ID: STACEY SAGO, MRN: [id number],    Date: 05/08/2023  Time Spent: 61 minutes Time In:  3:00 Time out: 4:01  Treatment Type: Individual Therapy  Reported Symptoms: flashbacks, responding to triggers, depression, anxiety, being busy all of the time, dissociation  Mental Status Exam: Appearance:  Casual     Behavior: Appropriate  Motor: normal  Speech/Language:  Clear and Coherent  Affect: Blunt  Mood: pleasant  Thought process: normal  Thought content:   WNL  Sensory/Perceptual disturbances:   WNL  Orientation: oriented to person, place, time/date, and situation  Attention: Good  Concentration: Good  Memory: WNL  Fund of knowledge:  Good  Insight:   Good  Judgment:  Fair  Impulse Control: Good   Risk Assessment: Danger to Self:  No Self-injurious Behavior: No Danger to Others: No Duty to Warn:no Physical Aggression / Violence:No  Access to Firearms a concern: No  Gang Involvement:No   Subjective: The patient attended a face-to-face individual therapy in the office today.  The patient presents as pleasant and cooperative.  The patient reports that she is doing okay and we did discuss the situation with her son who is trans right now and he is currently dating a female even though he is wanting to transition to female.  We processed this.  The patient is very supportive of her children and we talked about how she has made some changes and is much more open and handle things better than she has prior to doing therapy.  The patient still has not brought up that she is ready to do the EMDR and I will bring this up with her again the next time I see her. Interventions: Cognitive Behavioral Therapy, Mindfulness Meditation, Eye Movement Desensitization and Reprocessing (EMDR), Insight-Oriented, and Interpersonal, psychoeducation  Diagnosis:Major depressive disorder, recurrent episode, moderate (HCC)  PTSD  (post-traumatic stress disorder)  Attention deficit hyperactivity disorder (ADHD), combined type  Obsessional thoughts  Plan: Plan of Care: Client Abilities/Strengths  Insightful, motivated, Supportive family Client Treatment Preferences  Outpatient Individual therapy/EMDR  Client Statement of Needs  "I think I have PTSD and I feel like I need another kind of therapy than talk therapy" Treatment Level  Outpatient Individual therapy  Symptoms  Demonstrates an exaggerated startle response.:  (Status: maintained). Depressed  or irritable mood.: (Status:maintained). Describes a reliving of the event,  particularly through dissociative flashbacks.: (Status: maintained). Displays a  significant decline in interest and engagement in activities.:  (Status: maintained). Displays significant psychological and/or physiological distress resulting from internal and external  clues that are reminiscent of the traumatic event.: (Status: maintained).  Experiences disturbances in sleep.:  (Status: maintained). Experiences disturbing  and persistent thoughts, images, and/or perceptions of the traumatic event.:  (Status: maintained). Experiences frequent nightmares.: (Status: maintained).  Feelings of hopelessness, worthlessness, or inappropriate guilt.: No Description Entered (Status:  improved). Has been exposed to a traumatic event involving actual or perceived threat of death or  serious injury.:  (Status: maintained). Impairment in social, occupational, or  other areas of functioning.:(Status: maintained). Intentionally avoids activities,  places, people, or objects (e.g., up-armored vehicles) that evoke memories of the event.: (Status: maintained). Intentionally avoids thoughts, feelings, or discussions related  to the traumatic event.: (Status: maintained). Reports difficulty concentrating as  well as feelings of guilt (Status:maintained). Reports response of intense fear,  helplessness, or horror  to the traumatic event.:  (Status: maintained).  Problems Addressed  Unipolar Depression, Posttraumatic Stress Disorder (PTSD), Posttraumatic  Stress Disorder (PTSD),   Posttraumatic Stress Disorder (PTSD), Posttraumatic Stress Disorder (PTSD)  Goals 1. Develop healthy thinking patterns and beliefs about self, others, and the world that lead to the alleviation and help prevent the relapse of  depression. Objective Identify and replace thoughts and beliefs that support depression. Target Date: 2024/01/04 Frequency: Weekly Progress: 60 Modality: individual Related Interventions 1. Explore and restructure underlying assumptions and beliefs reflected in biased self-talk that  may put the client at risk for relapse or recurrence. 2. Conduct Cognitive-Behavioral Therapy (see Cognitive Behavior Therapy by Reola Calkins; Overcoming Depression by Agapito Games al.), beginning with helping the client learn the connection among  cognition, depressive feelings, and actions. 2. Eliminate or reduce the negative impact trauma related symptoms have  on social, occupational, and family functioning. Objective Learn and implement personal skills to manage challenging situations related to trauma. Target Date: 2024/01/04 Frequency: Weekly Progress: 0 Modality: individual 3. No longer avoids persons, places, activities, and objects that are  reminiscent of the traumatic event. Objective Participate in Eye Movement Desensitization and Reprocessing (EMDR) to reduce emotional distress  related to traumatic thoughts, feelings, and images. Target Date: 2024/01/04 Frequency: Weekly Progress: 0 Modality: individual   Related Interventions 1. Utilize Eye Movement Desensitization and Reprocessing (EMDR) to reduce the client's  emotional reactivity to the traumatic event and reduce PTSD symptoms. Objective Learn and implement guided self-dialogue to manage thoughts, feelings, and urges brought on by  encounters with  trauma-related situations. Target Date: 2025-11/07 Frequency: Weekly Progress: 10 Modality: individual Related Interventions 1. Teach the client a guided self-dialogue procedure in which he/she learns to recognize  maladaptive self-talk, challenges its biases, copes with engendered feelings, overcomes  avoidance, and reinforces his/her accomplishments; review and reinforce progress, problemsolve obstacles. 4. No longer experiences intrusive event recollections, avoidance of event  reminders, intense arousal, or disinterest in activities or  relationships. 5. Thinks about or openly discusses the traumatic event with others  without experiencing psychological or physiological distress. Diagnosis Axis  none F43.10 (Posttraumatic stress disorder) - Open - [Signifier: n/a] Posttraumatic Stress  Disorder  Conditions For Discharge Achievement of treatment goals and objectives   Lexianna Weinrich G Druanne Bosques, LCSW

## 2023-05-10 ENCOUNTER — Ambulatory Visit: Payer: Medicare Other | Attending: Cardiology | Admitting: Cardiology

## 2023-05-10 VITALS — BP 110/60 | HR 74 | Ht 62.0 in | Wt 126.4 lb

## 2023-05-10 DIAGNOSIS — H43391 Other vitreous opacities, right eye: Secondary | ICD-10-CM | POA: Diagnosis not present

## 2023-05-10 DIAGNOSIS — H2513 Age-related nuclear cataract, bilateral: Secondary | ICD-10-CM | POA: Diagnosis not present

## 2023-05-10 DIAGNOSIS — H35371 Puckering of macula, right eye: Secondary | ICD-10-CM | POA: Diagnosis not present

## 2023-05-10 DIAGNOSIS — R079 Chest pain, unspecified: Secondary | ICD-10-CM

## 2023-05-10 DIAGNOSIS — E785 Hyperlipidemia, unspecified: Secondary | ICD-10-CM

## 2023-05-10 DIAGNOSIS — H59813 Chorioretinal scars after surgery for detachment, bilateral: Secondary | ICD-10-CM | POA: Diagnosis not present

## 2023-05-10 DIAGNOSIS — H43811 Vitreous degeneration, right eye: Secondary | ICD-10-CM | POA: Diagnosis not present

## 2023-05-10 NOTE — Patient Instructions (Signed)
  Follow-Up: At [facility], you and your health needs are our priority.  As part of our continuing mission to provide you with exceptional heart care, we have created designated Provider Care Teams.  These Care Teams include your primary Cardiologist (physician) and Advanced Practice Providers (APPs -  Physician Assistants and Nurse Practitioners) who all work together to provide you with the care you need, when you need it.  We recommend signing up for the patient portal called "MyChart".  Sign up information is provided on this After Visit Summary.  MyChart is used to connect with patients for Virtual Visits (Telemedicine).  Patients are able to view lab/test results, encounter notes, upcoming appointments, etc.  Non-urgent messages can be sent to your provider as well.   To learn more about what you can do with MyChart, go to [email or web address].    Your next appointment:    AS NEEDED   

## 2023-05-10 NOTE — Progress Notes (Signed)
 Cardiology Office Note:    Date:  05/10/2023   ID:  Danielle Bruce, DOB 07-29-1960, MRN [id number]  PCP:  Lupita Raider, MD  Cardiologist:  None  Electrophysiologist:  None   Referring MD: Lupita Raider, MD   Chief Complaint  Patient presents with   Chest Pain    History of Present Illness:    Danielle Bruce is a 63 y.o. female with a hx of asthma, BPD, fibromyalgia, GERD, HLD, IBS, MGUS who presents for follow-up.  She was referred by Dr Clelia Croft for EKG abnormalities, initially seen 01/04/2023.  Previously followed with Dr Jens Som, last seen in 2020.  Holter monitor July 2020 showed sinus bradycardia, normal sinus rhythm, sinus tachycardia and rare PVC.  Echocardiogram August 2020 showed normal LV function and mild diastolic dysfunction.  Nuclear study August 2020 showed ejection fraction 70% with normal perfusion.  Calcium score 09/18/22 was 55 (86th percentile).  She had ED visit 10/2022 with chest pain.  Reports has had several episodes of crushing pain in center of chest, lasted for 15 to 30 minutes.  Coronary CTA on 01/22/2023 showed normal coronary arteries (suspected elevated calcium score from prior study was artifact, as no coronary calcium noted).  Echocardiogram 02/16/2023 showed normal biventricular function, no significant valvular disease.  Since last clinic visit, she reports she has been doing okay.  No recent chest pain.  Denies any  dyspnea, lower extremity edema, or palpitations.  Some lightheadedness but denies any syncope.    Past Medical History:  Diagnosis Date   Abdominal pain, unspecified site 10/18/2012   Anemia    Anxiety    Arthritis    osteoarthritis   ASCUS (atypical squamous cells of undetermined significance) on Pap smear 05/06/2005   NEG HIGH RISK HPV--C&B BIOPSY BENIGN 12/2005   Asthma    Bipolar 2 disorder (HCC)    Cancer (HCC)    skin cancer - basal cell   Colon polyps    Complication of anesthesia    anxious afterwards, will get headaches     Constipation    Depression    Fibromyalgia 10/2013   GERD (gastroesophageal reflux disease)    Hearing loss on left    Heart murmur    never had any problems   Hemorrhoids    High cholesterol    High risk HPV infection 08/2011   cytology negative   IBS (irritable bowel syndrome)    Insomnia    Lymphocytic colitis    Lymphoma (HCC)    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 Cornett, MD;  Location: [facility];  Service: General;  Laterality: N/A;   COLONOSCOPY     numerous times   DILATION AND CURETTAGE OF UTERUS     ESOPHAGOGASTRODUODENOSCOPY     HEMORRHOID SURGERY  1993   x3   IUD REMOVAL  02/2015   Mirena   LAPAROSCOPIC LYSIS OF ADHESIONS N/A 10/16/2012   Procedure: LAPAROSCOPIC LYSIS OF  ADHESIONS;  Surgeon: Clovis Pu. Cornett, MD;  Location: [facility];  Service: General;  Laterality: N/A;   LAPAROSCOPY N/A 10/16/2012   Procedure: LAPAROSCOPY DIAGNOSTIC;  Surgeon: Clovis Pu. Cornett, MD;  Location: [facility];  Service: General;  Laterality: N/A;   PELVIC LAPAROSCOPY     RADIOLOGY WITH ANESTHESIA N/A 12/16/2015   Procedure: MRI OF BRAIN WITH AND WITHOUT CONTRAST;  Surgeon: Medication Radiologist, MD;  Location: [facility];  Service: Radiology;  Laterality: N/A;   SHOULDER SURGERY  2007/2008   SPINE SURGERY  2010   cervical    Current Medications: Current Meds  Medication Sig   albuterol (PROVENTIL HFA;VENTOLIN HFA) 108 (90 Base) MCG/ACT inhaler Inhale 2 puffs into  the lungs every 6 (six) hours as needed for wheezing or shortness of breath.   amphetamine-dextroamphetamine (ADDERALL XR) 20 MG 24 hr capsule Take 1 capsule (20 mg total) by mouth daily.   azelastine (ASTELIN) 0.1 % nasal spray Place 1-2 sprays into both nostrils 2 (two) times daily as needed (nasal drainage). Use in each nostril as directed   Calcium Carb-Cholecalciferol (CALCIUM 1000 + D) 1000-20 MG-MCG TABS Take by mouth.   cetirizine (ZYRTEC) 10 MG tablet Take 1 tablet (10 mg total) by mouth daily.   cyclobenzaprine (FLEXERIL) 10 MG tablet Take 10 mg by mouth at bedtime as needed.   diphenhydrAMINE (BENADRYL) 25 mg capsule Take 50 mg by mouth every 6 (six) hours as needed (HEADACHES).   EPINEPHrine (EPIPEN 2-PAK) 0.3 mg/0.3 mL IJ SOAJ injection Inject 0.3 mg into the muscle as needed for anaphylaxis.   Erenumab-aooe 70 MG/ML SOAJ Inject into the skin every 28 (twenty-eight) days.   fluticasone (FLONASE) 50 MCG/ACT nasal spray Place 1-2 sprays into both nostrils daily as needed (nasal congestion).   fluvoxaMINE (LUVOX) 25 MG tablet Take 3 tablets (75 mg total) by mouth at bedtime.   hydrocortisone (ANUSOL-HC) 25 MG suppository 1 suppository   ibuprofen (ADVIL) 800 MG tablet 1 tablet with food or milk as needed   L-Methylfolate 15 MG TABS TAKE 1 TABLET BY MOUTH DAILY   LORazepam (ATIVAN) 1 MG tablet Take 1 tablet (1 mg total) by mouth every 8 (eight) hours as needed for anxiety. (takes 2 mg when having a medical procedure.)   NURTEC 75 MG TBDP Take 1 tablet by mouth daily as needed.   Olopatadine HCl 0.2 % SOLN Apply 1 drop to eye daily as needed (itchy/watery eyes).   oxyCODONE-acetaminophen (PERCOCET/ROXICET) 5-325 MG per tablet Take 2 tablets by mouth daily as needed for moderate pain or severe pain (mighraine.).    polyethylene glycol (MIRALAX / GLYCOLAX) packet Take 17 g by mouth daily as needed for mild constipation.   pregabalin (LYRICA) 50 MG capsule Take 100 mg by mouth 3 (three) times  daily.   PROLIA 60 MG/ML SOSY injection BRING TO THE OFFICE FOR INJECTION ON 02/15/2018 AS DIRECTED ONCE EVERY 6 MONTHS   promethazine (PHENERGAN) 25 MG tablet Take 25 mg by mouth every 6 (six) hours as needed for nausea.   traZODone (DESYREL) 50 MG tablet TAKE 1 TABLET(50 MG) BY MOUTH AT BEDTIME   VASCEPA 1 g CAPS TK 2 CS PO BID WC   VOLTAREN 1 % GEL Apply 2 g topically 4 (four) times daily as needed (JOINT PAIN).      Allergies:   Sulfa antibiotics, Gluten meal, Lactose intolerance (gi), Aspirin, Bromfed dm [pseudoeph-bromphen-dm], Chlorpromazine, Desipramine, Gabapentin, Hydrocodone, Lamotrigine, Metronidazole, Morphine sulfate, Nsaids, Flagyl [metronidazole hcl], and Morphine and codeine  Social History   Socioeconomic History   Marital status: Divorced    Spouse name: Not on file   Number of children: 3   Years of education: Not on file   Highest education level: Not on file  Occupational History   Not on file  Tobacco Use   Smoking status: Former    Current packs/day: 0.00    Types: Cigarettes    Quit date: 05/13/2014    Years since quitting: 8.9   Smokeless tobacco: Never   Tobacco comments:    social   Vaping Use   Vaping status: Never Used  Substance and Sexual Activity   Alcohol use: Yes    Alcohol/week: 0.0 standard drinks of alcohol    Comment: rare   Drug use: Never   Sexual activity: Not Currently    Comment: -1st intercourse 63 yo-More than 5 partners  Other Topics Concern   Not on file  Social History Narrative   Not on file   Social Drivers of Health   Financial Resource Strain: Not on file  Food Insecurity: Low Risk  (01/17/2023)   Received from [facility]   Hunger Vital Sign    Worried About Running Out of Food in the Last Year: Never true    Ran Out of Food in the Last Year: Never true  Transportation Needs: No Transportation Needs (01/17/2023)   Received from [facility]    In the past 12 months, has lack of reliable  transportation kept you from medical appointments, meetings, work or from getting things needed for daily living? : No  Physical Activity: Not on file  Stress: Not on file  Social Connections: Unknown (05/11/2022)   Received from [facility]   Social Network    Social Network: Not on file     Family History: The patient's family history includes Allergic rhinitis in her brother, brother, daughter, father, maternal aunt, maternal grandfather, maternal grandmother, maternal uncle, mother, paternal aunt, paternal grandfather, paternal grandmother, paternal uncle, sister, son, and son; Anxiety disorder in her daughter and son; Asthma in her daughter; Atopy in her son and son; Depression in her daughter, son, and son; Diabetes in her father; Eczema in her son; Heart disease in her maternal grandfather, paternal grandfather, and paternal grandmother; Hyperlipidemia in her father; Hypertension in her father. There is no history of Breast cancer.  ROS:   Please see the history of present illness.     All other systems reviewed and are negative.  EKGs/Labs/Other Studies Reviewed:    The following studies were reviewed today:   EKG:   01/04/2023: Normal sinus rhythm, rate 83, no ST abnormality, inferior Q waves  Recent Labs: 10/30/2022: Hemoglobin 14.2; Platelets 247 01/10/2023: BUN 16; Creatinine, Ser 0.61; Potassium 4.5; Sodium 140  Recent Lipid Panel No results found for: "CHOL", "TRIG", "HDL", "CHOLHDL", "VLDL", "LDLCALC", "LDLDIRECT"  Physical Exam:    VS:  BP 110/60   Pulse 74   Ht 5\' 2"  (1.575 m)   Wt 126 lb 6.4 oz (57.3 kg)   LMP 12/28/2010   SpO2 97%   BMI 23.12 kg/m     Wt Readings from Last 3 Encounters:  05/10/23 126 lb 6.4 oz (57.3 kg)  01/04/23 134 lb (60.8 kg)  11/01/22 131 lb 8 oz (59.6 kg)     GEN:  Well nourished, well developed in no acute distress HEENT: Normal NECK: No JVD; No carotid bruits CARDIAC: RRR, no murmurs, rubs,  gallops RESPIRATORY:  Clear to auscultation without rales, wheezing or rhonchi  ABDOMEN: Soft, non-tender, non-distended MUSCULOSKELETAL:  No edema; No deformity  SKIN: Warm and dry NEUROLOGIC:  Alert and oriented x 3 PSYCHIATRIC:  Normal affect   ASSESSMENT:    1. Chest pain, unspecified type   2. Hyperlipidemia, unspecified hyperlipidemia type     PLAN:    Chest pain/DOE: Calcium score 09/18/22 was 55 (86th percentile).  She reported atypical chest pain and dyspnea on exertion. Coronary CTA on 01/22/2023 showed normal coronary arteries (suspected elevated calcium score from prior study was artifact, as no coronary calcification noted).  Echocardiogram 02/16/2023 showed normal biventricular function, no significant valvular disease. -No further cardiac workup recommended  Hyperlipidemia: on rosuvastatin 10 mg daily.  LDL 75 on 02/22/23  RTC as needed  Medication Adjustments/Labs and Tests Ordered: Current medicines are reviewed at length with the patient today.  Concerns regarding medicines are outlined above.  No orders of the defined types were placed in this encounter.  No orders of the defined types were placed in this encounter.   Patient Instructions     Follow-Up: At [facility], you and your health needs are our priority.  As part of our continuing mission to provide you with exceptional heart care, we have created designated Provider Care Teams.  These Care Teams include your primary Cardiologist (physician) and Advanced Practice Providers (APPs -  Physician Assistants and Nurse Practitioners) who all work together to provide you with the care you need, when you need it.  We recommend signing up for the patient portal called "MyChart".  Sign up information is provided on this After Visit Summary.  MyChart is used to connect with patients for Virtual Visits (Telemedicine).  Patients are able to view lab/test results, encounter notes, upcoming appointments, etc.   Non-urgent messages can be sent to your provider as well.   To learn more about what you can do with MyChart, go to [email or web address].    Your next appointment:   AS NEEDED       Signed, Little Ishikawa, MD  05/10/2023 5:48 PM    [facility]

## 2023-05-14 ENCOUNTER — Ambulatory Visit: Payer: Medicare Other | Admitting: Psychiatry

## 2023-05-15 ENCOUNTER — Ambulatory Visit: Payer: Medicare Other | Admitting: Psychology

## 2023-05-21 ENCOUNTER — Ambulatory Visit: Payer: Medicare Other

## 2023-05-22 ENCOUNTER — Ambulatory Visit (INDEPENDENT_AMBULATORY_CARE_PROVIDER_SITE_OTHER): Payer: Medicare Other | Admitting: Psychology

## 2023-05-22 DIAGNOSIS — F428 Other obsessive-compulsive disorder: Secondary | ICD-10-CM

## 2023-05-22 DIAGNOSIS — F331 Major depressive disorder, recurrent, moderate: Secondary | ICD-10-CM

## 2023-05-22 DIAGNOSIS — F902 Attention-deficit hyperactivity disorder, combined type: Secondary | ICD-10-CM | POA: Diagnosis not present

## 2023-05-22 NOTE — Progress Notes (Signed)
 [facility] Counselor/Therapist Progress Note  Patient ID: Danielle Bruce, MRN: [id number],    Date: 05/22/2023  Time Spent: 47 minutes Time In:  3:01 Time out: 3:48  Treatment Type: Individual Therapy  Reported Symptoms: flashbacks, responding to triggers, depression, anxiety, being busy all of the time, dissociation  Mental Status Exam: Appearance:  Casual     Behavior: Appropriate  Motor: normal  Speech/Language:  Clear and Coherent  Affect: Blunt  Mood: pleasant  Thought process: normal  Thought content:   WNL  Sensory/Perceptual disturbances:   WNL  Orientation: oriented to person, place, time/date, and situation  Attention: Good  Concentration: Good  Memory: WNL  Fund of knowledge:  Good  Insight:   Good  Judgment:  Fair  Impulse Control: Good   Risk Assessment: Danger to Self:  No Self-injurious Behavior: No Danger to Others: No Duty to Warn:no Physical Aggression / Violence:No  Access to Firearms a concern: No  Gang Involvement:No   Subjective: The patient attended a face-to-face individual therapy via video visit today.  The patient gave verbal consent for the session to be on caregility and is aware of the limitations of telehealth.  The patient was in her home alone and the therapist was in the office.  The patient reports that she is sick and is not sure if she has COVID or some other respiratory illness.  She reports that she was down taking care of her grandchildren and she is not sure where exactly she picked up the illness that she was coughing and not feeling well.  I did offer for her to not do the session today but she wanted to go ahead and do it so we did a shorter session today.  [place] and her concerns about how her daughter is parenting her grandchildren.  We talked about it not being something that she is going to be able to change because her daughter does not want any kind of discipline or  rules to  follow.  We talked about the only thing that she is going to be able to do is to set limits about herself and what she is willing to take on it and not take on.  The patient understood this and we also talked about the need to not give advice unless her daughter ask for it.  Interventions: Cognitive Behavioral Therapy, Mindfulness Meditation, Eye Movement Desensitization and Reprocessing (EMDR), Insight-Oriented, and Interpersonal, psychoeducation  Diagnosis:Major depressive disorder, recurrent episode, moderate (HCC)  Attention deficit hyperactivity disorder (ADHD), combined type  Obsessional thoughts  Plan: Plan of Care: Client Abilities/Strengths  Insightful, motivated, Supportive family Client Treatment Preferences  Outpatient Individual therapy/EMDR  Client Statement of Needs  "I think I have PTSD and I feel like I need another kind of therapy than talk therapy" Treatment Level  Outpatient Individual therapy  Symptoms  Demonstrates an exaggerated startle response.:  (Status: maintained). Depressed  or irritable mood.: (Status:maintained). Describes a reliving of the event,  particularly through dissociative flashbacks.: (Status: maintained). Displays a  significant decline in interest and engagement in activities.:  (Status: maintained). Displays significant psychological and/or physiological distress resulting from internal and external  clues that are reminiscent of the traumatic event.: (Status: maintained).  Experiences disturbances in sleep.:  (Status: maintained). Experiences disturbing  and persistent thoughts, images, and/or perceptions of the traumatic event.:  (Status: maintained). Experiences frequent nightmares.: (Status: maintained).  Feelings of hopelessness, worthlessness, or inappropriate guilt.: No Description Entered (Status:  improved).  Has been exposed to a traumatic event involving actual or perceived threat of death or  serious injury.:  (Status: maintained).  Impairment in social, occupational, or  other areas of functioning.:(Status: maintained). Intentionally avoids activities,  places, people, or objects (e.g., up-armored vehicles) that evoke memories of the event.: (Status: maintained). Intentionally avoids thoughts, feelings, or discussions related  to the traumatic event.: (Status: maintained). Reports difficulty concentrating as  well as feelings of guilt (Status:maintained). Reports response of intense fear,  helplessness, or horror to the traumatic event.:  (Status: maintained).  Problems Addressed  Unipolar Depression, Posttraumatic Stress Disorder (PTSD), Posttraumatic Stress Disorder (PTSD),   Posttraumatic Stress Disorder (PTSD), Posttraumatic Stress Disorder (PTSD)  Goals 1. Develop healthy thinking patterns and beliefs about self, others, and the world that lead to the alleviation and help prevent the relapse of  depression. Objective Identify and replace thoughts and beliefs that support depression. Target Date: 2024/01/04 Frequency: Weekly Progress: 60 Modality: individual Related Interventions 1. Explore and restructure underlying assumptions and beliefs reflected in biased self-talk that  may put the client at risk for relapse or recurrence. 2. Conduct Cognitive-Behavioral Therapy (see Cognitive Behavior Therapy by Reola Calkins; Overcoming Depression by Agapito Games al.), beginning with helping the client learn the connection among  cognition, depressive feelings, and actions. 2. Eliminate or reduce the negative impact trauma related symptoms have  on social, occupational, and family functioning. Objective Learn and implement personal skills to manage challenging situations related to trauma. Target Date: 2024/01/04 Frequency: Weekly Progress: 0 Modality: individual 3. No longer avoids persons, places, activities, and objects that are  reminiscent of the traumatic event. Objective Participate in Eye Movement Desensitization and  Reprocessing (EMDR) to reduce emotional distress  related to traumatic thoughts, feelings, and images. Target Date: 2024/01/04 Frequency: Weekly Progress: 0 Modality: individual   Related Interventions 1. Utilize Eye Movement Desensitization and Reprocessing (EMDR) to reduce the client's  emotional reactivity to the traumatic event and reduce PTSD symptoms. Objective Learn and implement guided self-dialogue to manage thoughts, feelings, and urges brought on by  encounters with trauma-related situations. Target Date: 2025-11/07 Frequency: Weekly Progress: 10 Modality: individual Related Interventions 1. Teach the client a guided self-dialogue procedure in which he/she learns to recognize  maladaptive self-talk, challenges its biases, copes with engendered feelings, overcomes  avoidance, and reinforces his/her accomplishments; review and reinforce progress, problemsolve obstacles. 4. No longer experiences intrusive event recollections, avoidance of event  reminders, intense arousal, or disinterest in activities or  relationships. 5. Thinks about or openly discusses the traumatic event with others  without experiencing psychological or physiological distress. Diagnosis Axis  none F43.10 (Posttraumatic stress disorder) - Open - [Signifier: n/a] Posttraumatic Stress  Disorder  Conditions For Discharge Achievement of treatment goals and objectives   Tycho Cheramie G Doneen Ollinger, LCSW

## 2023-05-24 DIAGNOSIS — J029 Acute pharyngitis, unspecified: Secondary | ICD-10-CM | POA: Diagnosis not present

## 2023-05-24 DIAGNOSIS — M797 Fibromyalgia: Secondary | ICD-10-CM | POA: Diagnosis not present

## 2023-05-24 DIAGNOSIS — S8002XA Contusion of left knee, initial encounter: Secondary | ICD-10-CM | POA: Diagnosis not present

## 2023-05-24 DIAGNOSIS — G43909 Migraine, unspecified, not intractable, without status migrainosus: Secondary | ICD-10-CM | POA: Diagnosis not present

## 2023-05-24 DIAGNOSIS — J988 Other specified respiratory disorders: Secondary | ICD-10-CM | POA: Diagnosis not present

## 2023-05-24 DIAGNOSIS — R053 Chronic cough: Secondary | ICD-10-CM | POA: Diagnosis not present

## 2023-05-24 DIAGNOSIS — F3181 Bipolar II disorder: Secondary | ICD-10-CM | POA: Diagnosis not present

## 2023-05-24 DIAGNOSIS — Z03818 Encounter for observation for suspected exposure to other biological agents ruled out: Secondary | ICD-10-CM | POA: Diagnosis not present

## 2023-05-29 ENCOUNTER — Ambulatory Visit (INDEPENDENT_AMBULATORY_CARE_PROVIDER_SITE_OTHER): Payer: Medicare Other | Admitting: Psychology

## 2023-05-29 DIAGNOSIS — F428 Other obsessive-compulsive disorder: Secondary | ICD-10-CM

## 2023-05-29 DIAGNOSIS — F4310 Post-traumatic stress disorder, unspecified: Secondary | ICD-10-CM | POA: Diagnosis not present

## 2023-05-29 DIAGNOSIS — F902 Attention-deficit hyperactivity disorder, combined type: Secondary | ICD-10-CM

## 2023-05-29 DIAGNOSIS — F331 Major depressive disorder, recurrent, moderate: Secondary | ICD-10-CM

## 2023-05-29 NOTE — Progress Notes (Unsigned)
                edge,  experiencing concentration difficulties, having trouble falling or staying asleep, exhibiting a general  state of irritability).: No Description Entered (Status: improved). Motor tension (e.g., restlessness,  tiredness, shakiness, muscle tension).: No Description Entered (Status: improved).  Problems Addressed  Anxiety, Phase Of Life Problems, Anxiety  Goals 1. Learn and implement coping skills that result in a reduction of anxiety  and worry, and improved daily functioning. Objective Learn  and implement calming skills to reduce overall anxiety and manage anxiety symptoms. Target Date: 2025-08-09Frequency: Weekly Progress: 40 Modality: individual  Related Interventions 1. Teach the client calming/relaxation skills (e.g., applied relaxation, progressive muscle  relaxation, cue controlled relaxation; mindful breathing; biofeedback) and how to discriminate  better between relaxation and tension; teach the client how to apply these skills to his/her daily  life (e.g., New Directions in Progressive Muscle Relaxation by Marcelyn Ditty, and  Hazlett-Stevens; Treating Generalized Anxiety Disorder by Rygh and Ida Rogue). Objective Identify, challenge, and replace biased, fearful self-talk with positive, realistic, and empowering selftalk. Target Date: 2023-10-06 Frequency: weekly Progress: 30 Modality: individual Related Interventions 1. Explore the client's schema and self-talk that mediate his/her fear response; assist him/her in  challenging the biases; replace the distorted messages with reality-based alternatives and  positive, realistic self-talk that will increase his/her self-confidence in coping with irrational  fears (see Cognitive Therapy of Anxiety Disorders by Laurence Slate). Objective Learn and implement problem-solving strategies for realistically addressing worries. Target Date: 2025-08-09Frequency: weekly Progress: 40 Modality: individual 2. Resolve conflicted feelings and adapt to the new life circumstances. Objective Apply problem-solving skills to current circumstances. Target Date: 2023-10-06 Frequency: weekly Progress: 20 Modality: individual Related Interventions 1. Teach the client problem-resolution skills (e.g., defining the problem clearly, brainstorming  multiple solutions, listing the pros and cons of each solution, seeking input from others,  selecting and implementing a plan of action, evaluating outcome, and readjusting plan as   necessary).   3. Stabilize anxiety level while increasing ability to function on a daily  basis. Diagnosis F33.1  Major depressive disorder, moderate 300.02 (Generalized anxiety disorder) - Open - [Signifier: n/a]  Axis  none 309.28 (Adjustment disorder with mixed anxiety and depressed  mood) - Open - [Signifier: n/a]  Adjustment Disorder,  With Anxiety   Marital conflict  Major Depressive disorder, moderate  Conditions For Discharge Achievement of treatment goals and objectives.  The patient approved this plan.   Deonna Krummel G Ethridge Sollenberger, LCSW

## 2023-05-30 ENCOUNTER — Ambulatory Visit: Payer: Self-pay | Attending: Family Medicine

## 2023-05-30 ENCOUNTER — Encounter: Payer: Self-pay | Admitting: Family Medicine

## 2023-05-30 DIAGNOSIS — M542 Cervicalgia: Secondary | ICD-10-CM | POA: Insufficient documentation

## 2023-05-30 DIAGNOSIS — M353 Polymyalgia rheumatica: Secondary | ICD-10-CM | POA: Insufficient documentation

## 2023-05-30 DIAGNOSIS — M6281 Muscle weakness (generalized): Secondary | ICD-10-CM | POA: Insufficient documentation

## 2023-05-30 DIAGNOSIS — R252 Cramp and spasm: Secondary | ICD-10-CM | POA: Diagnosis not present

## 2023-05-30 DIAGNOSIS — R293 Abnormal posture: Secondary | ICD-10-CM | POA: Insufficient documentation

## 2023-05-30 NOTE — Therapy (Signed)
 OUTPATIENT PHYSICAL THERAPY TREATMENT     Patient Name: Danielle Bruce MRN: [id number] DOB:1960-03-02, 63 y.o., female Today's Date: 05/30/2023  PT End of Session - 05/30/23 1231     Visit Number 70    Date for PT Re-Evaluation 07/27/23    Authorization Type Medicare B- KX at 15 for 2025 (4/15)    Progress Note Due on Visit 78    PT Start Time 1147    PT Stop Time 1231    PT Time Calculation (min) 44 min    Activity Tolerance Patient tolerated treatment well    Behavior During Therapy [facility] for tasks assessed/performed                Past Medical History:  Diagnosis Date   Abdominal pain, unspecified site 10/18/2012   Anemia    Anxiety    Arthritis    osteoarthritis   ASCUS (atypical squamous cells of undetermined significance) on Pap smear 05/06/2005   NEG HIGH RISK HPV--C&B BIOPSY BENIGN 12/2005   Asthma    Bipolar 2 disorder (HCC)    Cancer (HCC)    skin cancer - basal cell   Colon polyps    Complication of anesthesia    anxious afterwards, will get headaches    Constipation    Depression    Fibromyalgia 10/2013   GERD (gastroesophageal reflux disease)    Hearing loss on left    Heart murmur    never had any problems   Hemorrhoids    High cholesterol    High risk HPV infection 08/2011   cytology negative   IBS (irritable bowel syndrome)    Insomnia    Lymphocytic colitis    Lymphoma (HCC)    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   Bone marrow biopsy showes 8% plasma cells IgA Lambda   Migraines    Nausea alone 10/18/2012   Osteoarthritis    Osteopenia    Peripheral neuropathy    PTSD (post-traumatic stress disorder)    Past Surgical History:  Procedure Laterality Date   BONE MARROW BIOPSY Left 12/18/2013   Plasma cell dyscrasia 8% population of plasma cells   BREAST BIOPSY Right    benign stereo   CESAREAN SECTION  52,84,13   CHOLECYSTECTOMY N/A 10/16/2012   Procedure: LAPAROSCOPIC CHOLECYSTECTOMY;  Surgeon: Clovis Pu.  Cornett, MD;  Location: [facility];  Service: General;  Laterality: N/A;   COLONOSCOPY     numerous times   DILATION AND CURETTAGE OF UTERUS     ESOPHAGOGASTRODUODENOSCOPY     HEMORRHOID SURGERY  1993   x3   IUD REMOVAL  02/2015   Mirena   LAPAROSCOPIC LYSIS OF ADHESIONS N/A 10/16/2012   Procedure: LAPAROSCOPIC LYSIS OF ADHESIONS;  Surgeon: Maisie Fus A. Cornett, MD;  Location: [facility];  Service: General;  Laterality: N/A;   LAPAROSCOPY N/A 10/16/2012   Procedure: LAPAROSCOPY DIAGNOSTIC;  Surgeon: Clovis Pu. Cornett, MD;  Location: [facility];  Service: General;  Laterality: N/A;   PELVIC LAPAROSCOPY     RADIOLOGY WITH ANESTHESIA N/A 12/16/2015   Procedure: MRI OF BRAIN WITH AND WITHOUT CONTRAST;  Surgeon: Medication Radiologist, MD;  Location: [facility];  Service: Radiology;  Laterality: N/A;   SHOULDER SURGERY  2007/2008   SPINE SURGERY  2010   cervical   Patient Active Problem List   Diagnosis Date Noted   GAD (generalized anxiety disorder) 12/26/2017   OCD (obsessive compulsive disorder) 12/26/2017   PTSD (post-traumatic stress disorder) 12/26/2017   DDD (degenerative disc  disease), cervical 07/14/2016   Primary osteoarthritis of both feet 07/14/2016   Primary osteoarthritis of both hands 07/14/2016   Other fatigue 07/14/2016   History of IBS 07/14/2016   Osteopenia of multiple sites 07/14/2016   Fibromyalgia 01/13/2016   MGUS (monoclonal gammopathy of unknown significance) 12/23/2013   Chronic cholecystitis without calculus 10/18/2012   Abdominal pain 10/18/2012   Nausea alone 10/18/2012   Constipation 10/18/2012   Depression 10/18/2012   Anxiety    ASCUS (atypical squamous cells of undetermined significance) on Pap smear    IUD    Hemorrhoids 11/29/2010   Abdominal pain, left upper quadrant 11/29/2010    PCP:  Lupita Raider, MD  REFERRING PROVIDER: Lupita Raider, MD  REFERRING DIAG: fibromyalgia, TMJ dysfunction (added 04/20/22)  THERAPY DIAG:  Abnormal posture  Cramp and  spasm  Muscle weakness (generalized)  Polymyalgia rheumatica (HCC)  Cervicalgia  Rationale for Evaluation and Treatment Rehabilitation  ONSET DATE: chronic pain (fibromyalgia) with flare-up 2 months ago  SUBJECTIVE:                                                                                                                                                                                                         SUBJECTIVE STATEMENT:  I had a fall recently.  I was in the bathroom and there was a wet spot. My hips and knees are sore from this.  I'm trying to move as much as I can.      PERTINENT HISTORY:  Anxiety, bipolar disorder, depression, fibromyalgia, IBS, migraines, osteopenia, PTSD  PAIN: 05/30/23 Are you having pain: yes Pain location: hips/knees (5/10), Rt jaw (5/10)   Pain rating: see above Pain description: irritating, annoying   Aggravating factors: doing too much Relieving factors: muscle relaxers, rest, stretching  PRECAUTIONS: Other: chronic pain syndrome and depression Other: slow progression with exercise due to chronic condition  WEIGHT BEARING RESTRICTIONS No  FALLS:  Has patient fallen in last 6 months? No  LIVING ENVIRONMENT: Lives with: lives with their family Lives in: House/apartment  OCCUPATION: on disability  PLOF: Independent with basic ADLs and Leisure: yardwork, walking  Pt cares for her 4 young grandchildren- difficulty with care including lifting and carrying  PATIENT GOALS be more active with less pain, exercise at the gym regularly, lift and carry grandchildren and laundry, sleep with fewer interruptions, get back to more gardening.  OBJECTIVE:   DIAGNOSTIC FINDINGS: none recent   PATIENT SURVEYS:  The Patient-Specific Functional Scale  Initial:  I am going to ask you to identify up to 3 important activities  that you are unable to do or are having difficulty with as a result of this problem.  Today are there any activities that  you are unable to do or having difficulty with because of this?  (Patient shown scale and patient rated each activity)  Follow up: When you first came in you had difficulty performing these activities.  Today do you still have difficulty?  Patient-Specific activity scoring scheme (Point to one number):  0 1 2 3 4 5 6 7 8 9  10 Unable                                                                                                          Able to perform To perform                                                                                                    activity at the same Activity         Level as before                                                                                                                       Injury or problem  Activity   Lifting laundry and groceries                           follow up:  2 (01/15/23)  follow up: 3 (03/08/23)   follow up: 5/10 (3/325)  2.    Yard work- up and down with squatting           follow up: 7/10 (01/15/23)  follow-up: 7/10 (03/08/23)  follow-up: 7/10 no change due to winter weather (04/30/23)     3.    Drive long distances                                          follow up: 4/10 (01/15/23)    follow-up: not doing this    (03/08/23)    follow-up: not doing this, short  distance driving is 1/61, long distance 4/10 (04/30/23)    COGNITION: Overall cognitive status: Within functional limits for tasks assessed  SENSATION: WFL  POSTURE: rounded shoulders and forward head  PALPATION: Diffuse palpable tenderness over bil neck, upper traps, thoracic, lumbar and gluteals with trigger points.    CERVICAL ROM:   Active ROM A/ROM 04/20/22 A/ROM 08/07/22  Flexion  60  Extension    Right lateral flexion 40 41  Left lateral flexion 45 50  Right rotation 65 70  Left rotation 65 75   (Blank rows = not tested) TMJ: opening limited by 25%, clicking with opening on the Rt.  Palpable tenderness over Rt masseter and at TMJ UPPER EXTREMITY  ROM: UE A/ROM is limited by 20% into flexion and abduction with pain at end range.  Hip flexibility is limited by 25% in all directions with pain in all directions.  UPPER EXTREMITY MMT: UE: 4+/5, LE 4+/5 except hip flexors 4-/5  TODAY'S TREATMENT:  05/30/22: NuStep: Level 4 x 10 minutes for endurance and mobility-PT present to discuss progress. Ball roll outs forward and lateral x10 each Seated hamstring and piriformis stretch 3x20 seconds  Sit to stand: x15 TMJ assessment:  Pain with inferior and distraction with anterior movement on Rt, no pain with lateral glide, tender over masseter and internal/external temporalis on Rt Hinge technique for TMJ opening  [email or web address] issued self mobilzation for TMJ video Trigger Point Dry Needling  Subsequent Treatment: Instructions provided previously at initial dry needling treatment.   Patient Verbal Consent Given: Yes Education Handout Provided: Previously Provided Muscles Treated: Rt and Lt masseter Electrical Stimulation Performed: No Treatment Response/Outcome: elongation and release to treated muscles after dry needling- internal masseter and temporalis insertions   04/30/23: Self care: education regarding stress management and avoidance of parafunction that will aggravate her jaw and neck/head Manual:  Elongation and release with cervical mobs- tender on the Rt   04/11/22: NuStep: Level 3 x 10 minutes for endurance and mobility-PT present to discuss progress. Ball roll outs forward and lateral x10 each Seated on green ball: 2# 3 way raises 2x10 Sit to stand with 5# and upright row: 2x10   Manual:  Elongation and release with cervical mobs   03/07/22: NuStep: Level 3 x 10 minutes for endurance and mobility-PT present to discuss progress. Ball roll outs forward and lateral x10 each Seated on green ball: 2# 3 way raises 2x10 Trigger Point Dry Needling  Subsequent Treatment: Instructions provided  previously at initial dry needling treatment.   Patient Verbal Consent Given: Yes Education Handout Provided: Previously Provided Muscles Treated: bil neck, upper traps, thoracic multifidi Electrical Stimulation Performed: No Treatment Response/Outcome: elongation and release to treated muscles after DN    HOME EXERCISE PROGRAM: Access Code: [email or web address] Access Code: [email or web address] URL: [email or web address] Date: 09/06/2022 Prepared by: Tresa Endo                - Standing Hip Abduction with Counter Support  - 1 x daily - 7 x weekly - 1-2 sets - 10 reps - Standing Hip Extension with Chair  - 1 x daily - 7 x weekly - 1-2 sets - 10 reps ASSESSMENT:  CLINICAL IMPRESSION: Pt had a fall 2 weeks ago and reports hip and knee pain .  Pt tolerated gentle exercise for mobility and flexibility today and she will continue to advance activity at home.  She will attend 1-2 pool sessions to assist with movement while still painful from fall. Pt with flare up of Rt  TMJ and PT provided manual therapy and exercise progression to address. Pt with reduced pain in hips and knees after stretching.  Painful joint mobility with depression and depression/anterior glide on Rt TMJ.  Tender over internal masseter and temporalis and responded well to manual therapy.  Patient will benefit from skilled PT to address the below impairments and improve overall function.   OBJECTIVE IMPAIRMENTS decreased activity tolerance, decreased endurance, difficulty walking, decreased strength, increased muscle spasms, impaired flexibility, improper body mechanics, postural dysfunction, and pain.   ACTIVITY LIMITATIONS carrying, lifting, sitting, standing, reach over head, hygiene/grooming, and locomotion level  PARTICIPATION LIMITATIONS: meal prep, cleaning, laundry, driving, community activity, and yard work  PERSONAL FACTORS Past/current experiences, Time since onset of injury/illness/exacerbation, and 3+ comorbidities:  fibromyalgia depression, anxiety,   are also affecting patient's functional outcome.   REHAB POTENTIAL: Good  CLINICAL DECISION MAKING: Evolving/moderate complexity  EVALUATION COMPLEXITY: Moderate   GOALS: Goals reviewed with patient? Yes  SHORT TERM GOALS: Target date: 12/22/22  Return to regular performance of HEP 4-5x/week to improve mobility  Baseline: met since last visit (12/06/22) Goal status: In progress  2.  Rate lifting (groceries and laundry) > or = to 3/10 on patient specific functional scale (PSFS) Baseline: 2/10 Goal status: NEW  3.  Initiate walking program for exercise > or = to 2x/wk Baseline: not walking  Goal status: NEW    LONG TERM GOALS: Target date: 07/27/23  Be independent in advanced HEP Baseline: exercising when pain and mental health allows, PT assessing every 3 weeks (04/30/23) Goal status: In progress   2.  Rate squatting for yard work > or = to 8/10 on PSFS Baseline: 7/10 (04/30/23) Goal status: Revised   3.  Rate driving > or = to 1/61 on PSFS Baseline: hasn't been driving, taking train for long trips (03/08/23) Goal status: in progress    4.  Walk for exercise and perform regular exercises 3-5x/wk for exercise to improve endurance  Baseline: active with her grandchildren, walks as weather arrives (04/30/23) Goal status: In progress    5.  Rate lifting (groceries and laundry) > or = to 6/10 on PSFS  Baseline: 5/10  (04/30/23) Goal status: revised     PLAN: PT FREQUENCY: every other week  PT DURATION: 8 weeks  PLANNED INTERVENTIONS: Therapeutic exercises, Therapeutic activity, Neuromuscular re-education, Balance training, Gait training, Patient/Family education, Self Care, Joint mobilization, Aquatic Therapy, Dry Needling, Spinal manipulation, Spinal mobilization, Cryotherapy, Moist heat, Taping, Traction, Manual therapy, and Re-evaluation  PLAN FOR NEXT SESSION:  1 visit every 2-3 weeks- modify HEP, manual for pain.  1-2 sessions in pool  due to pain from recent fall.   Lorrene Reid, PT 05/30/23 12:52 PM

## 2023-05-31 ENCOUNTER — Other Ambulatory Visit: Payer: Self-pay | Admitting: Psychiatry

## 2023-06-05 ENCOUNTER — Ambulatory Visit: Payer: Medicare Other | Admitting: Psychology

## 2023-06-11 ENCOUNTER — Encounter: Payer: Self-pay | Admitting: Family Medicine

## 2023-06-12 ENCOUNTER — Ambulatory Visit: Payer: Medicare Other | Admitting: Psychology

## 2023-06-14 ENCOUNTER — Other Ambulatory Visit: Payer: Self-pay | Admitting: Family Medicine

## 2023-06-14 DIAGNOSIS — R5381 Other malaise: Secondary | ICD-10-CM

## 2023-06-18 ENCOUNTER — Other Ambulatory Visit: Payer: Self-pay | Admitting: Psychiatry

## 2023-06-18 DIAGNOSIS — F5105 Insomnia due to other mental disorder: Secondary | ICD-10-CM

## 2023-06-19 ENCOUNTER — Ambulatory Visit

## 2023-06-19 ENCOUNTER — Ambulatory Visit (INDEPENDENT_AMBULATORY_CARE_PROVIDER_SITE_OTHER): Payer: Medicare Other | Admitting: Psychology

## 2023-06-19 DIAGNOSIS — R293 Abnormal posture: Secondary | ICD-10-CM

## 2023-06-19 DIAGNOSIS — F428 Other obsessive-compulsive disorder: Secondary | ICD-10-CM

## 2023-06-19 DIAGNOSIS — F902 Attention-deficit hyperactivity disorder, combined type: Secondary | ICD-10-CM | POA: Diagnosis not present

## 2023-06-19 DIAGNOSIS — F411 Generalized anxiety disorder: Secondary | ICD-10-CM | POA: Diagnosis not present

## 2023-06-19 DIAGNOSIS — F331 Major depressive disorder, recurrent, moderate: Secondary | ICD-10-CM | POA: Diagnosis not present

## 2023-06-19 DIAGNOSIS — M6281 Muscle weakness (generalized): Secondary | ICD-10-CM

## 2023-06-19 DIAGNOSIS — F4310 Post-traumatic stress disorder, unspecified: Secondary | ICD-10-CM

## 2023-06-19 DIAGNOSIS — R252 Cramp and spasm: Secondary | ICD-10-CM | POA: Diagnosis not present

## 2023-06-19 DIAGNOSIS — M353 Polymyalgia rheumatica: Secondary | ICD-10-CM | POA: Diagnosis not present

## 2023-06-19 DIAGNOSIS — M542 Cervicalgia: Secondary | ICD-10-CM | POA: Diagnosis not present

## 2023-06-19 NOTE — Therapy (Signed)
 OUTPATIENT PHYSICAL THERAPY TREATMENT     Patient Name: Danielle Bruce MRN: [id number] DOB:[birth year], 63 y.o., female Today's Date: 06/19/2023  PT End of Session - 06/19/23 1230     Visit Number 71    Date for PT Re-Evaluation 07/27/23    Authorization Type Medicare B- KX at 15 for 2025 (5/15)    Progress Note Due on Visit 78    PT Start Time 1152    PT Stop Time 1230    PT Time Calculation (min) 38 min    Activity Tolerance Patient tolerated treatment well    Behavior During Therapy [facility] for tasks assessed/performed                 Past Medical History:  Diagnosis Date   Abdominal pain, unspecified site 10/18/2012   Anemia    Anxiety    Arthritis    osteoarthritis   ASCUS (atypical squamous cells of undetermined significance) on Pap smear 05/06/2005   NEG HIGH RISK HPV--C&B BIOPSY BENIGN 12/2005   Asthma    Bipolar 2 disorder (HCC)    Cancer (HCC)    skin cancer - basal cell   Colon polyps    Complication of anesthesia    anxious afterwards, will get headaches    Constipation    Depression    Fibromyalgia 10/2013   GERD (gastroesophageal reflux disease)    Hearing loss on left    Heart murmur    never had any problems   Hemorrhoids    High cholesterol    High risk HPV infection 08/2011   cytology negative   IBS (irritable bowel syndrome)    Insomnia    Lymphocytic colitis    Lymphoma (HCC)    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   Bone marrow biopsy showes 8% plasma cells IgA Lambda   Migraines    Nausea alone 10/18/2012   Osteoarthritis    Osteopenia    Peripheral neuropathy    PTSD (post-traumatic stress disorder)    Past Surgical History:  Procedure Laterality Date   BONE MARROW BIOPSY Left 12/18/2013   Plasma cell dyscrasia 8% population of plasma cells   BREAST BIOPSY Right    benign stereo   CESAREAN SECTION  40,98,11   CHOLECYSTECTOMY N/A 10/16/2012   Procedure: LAPAROSCOPIC CHOLECYSTECTOMY;  Surgeon: Brandy Cal. Cornett, MD;  Location: [facility];  Service: General;  Laterality: N/A;   COLONOSCOPY     numerous times   DILATION AND CURETTAGE OF UTERUS     ESOPHAGOGASTRODUODENOSCOPY     HEMORRHOID SURGERY  1993   x3   IUD REMOVAL  02/2015   Mirena   LAPAROSCOPIC LYSIS OF ADHESIONS N/A 10/16/2012   Procedure: LAPAROSCOPIC LYSIS OF ADHESIONS;  Surgeon: Andy Bannister A. Cornett, MD;  Location: [facility];  Service: General;  Laterality: N/A;   LAPAROSCOPY N/A 10/16/2012   Procedure: LAPAROSCOPY DIAGNOSTIC;  Surgeon: Brandy Cal. Cornett, MD;  Location: [facility];  Service: General;  Laterality: N/A;   PELVIC LAPAROSCOPY     RADIOLOGY WITH ANESTHESIA N/A 12/16/2015   Procedure: MRI OF BRAIN WITH AND WITHOUT CONTRAST;  Surgeon: Medication Radiologist, MD;  Location: [facility];  Service: Radiology;  Laterality: N/A;   SHOULDER SURGERY  2007/2008   SPINE SURGERY  2010   cervical   Patient Active Problem List   Diagnosis Date Noted   GAD (generalized anxiety disorder) 12/26/2017   OCD (obsessive compulsive disorder) 12/26/2017   PTSD (post-traumatic stress disorder) 12/26/2017   DDD (degenerative  disc disease), cervical 07/14/2016   Primary osteoarthritis of both feet 07/14/2016   Primary osteoarthritis of both hands 07/14/2016   Other fatigue 07/14/2016   History of IBS 07/14/2016   Osteopenia of multiple sites 07/14/2016   Fibromyalgia 01/13/2016   MGUS (monoclonal gammopathy of unknown significance) 12/23/2013   Chronic cholecystitis without calculus 10/18/2012   Abdominal pain 10/18/2012   Nausea alone 10/18/2012   Constipation 10/18/2012   Depression 10/18/2012   Anxiety    ASCUS (atypical squamous cells of undetermined significance) on Pap smear    IUD    Hemorrhoids 11/29/2010   Abdominal pain, left upper quadrant 11/29/2010    PCP:  Glena Landau, MD  REFERRING PROVIDER: Glena Landau, MD  REFERRING DIAG: fibromyalgia, TMJ dysfunction (added 04/20/22)  THERAPY DIAG:  Abnormal posture  Cramp and  spasm  Muscle weakness (generalized)  Polymyalgia rheumatica (HCC)  Rationale for Evaluation and Treatment Rehabilitation  ONSET DATE: chronic pain (fibromyalgia) with flare-up 2 months ago  SUBJECTIVE:                                                                                                                                                                                                         SUBJECTIVE STATEMENT:  Still having Lt LE pain from the fall I had. I've been stretching.  My jaw felt so much better after last session.  The clicking has stopped.      PERTINENT HISTORY:  Anxiety, bipolar disorder, depression, fibromyalgia, IBS, migraines, osteopenia, PTSD  PAIN: 06/19/23 Are you having pain: yes Pain location: Lt hips/knees (3-4/10),   Pain rating: see above Pain description: irritating, annoying   Aggravating factors: doing too much Relieving factors: muscle relaxers, rest, stretching  PRECAUTIONS: Other: chronic pain syndrome and depression Other: slow progression with exercise due to chronic condition  WEIGHT BEARING RESTRICTIONS No  FALLS:  Has patient fallen in last 6 months? No  LIVING ENVIRONMENT: Lives with: lives with their family Lives in: House/apartment  OCCUPATION: on disability  PLOF: Independent with basic ADLs and Leisure: yardwork, walking  Pt cares for her 4 young grandchildren- difficulty with care including lifting and carrying  PATIENT GOALS be more active with less pain, exercise at the gym regularly, lift and carry grandchildren and laundry, sleep with fewer interruptions, get back to more gardening.  OBJECTIVE:   DIAGNOSTIC FINDINGS: none recent   PATIENT SURVEYS:  The Patient-Specific Functional Scale  Initial:  I am going to ask you to identify up to 3 important activities that you are unable to do or are having difficulty  with as a result of this problem.  Today are there any activities that you are unable to do or having  difficulty with because of this?  (Patient shown scale and patient rated each activity)  Follow up: When you first came in you had difficulty performing these activities.  Today do you still have difficulty?  Patient-Specific activity scoring scheme (Point to one number):  0 1 2 3 4 5 6 7 8 9  10 Unable                                                                                                          Able to perform To perform                                                                                                    activity at the same Activity         Level as before                                                                                                                       Injury or problem  Activity   Lifting laundry and groceries                           follow up:  2 (01/15/23)  follow up: 3 (03/08/23)   follow up: 5/10 (3/325)  2.    Yard work- up and down with squatting           follow up: 7/10 (01/15/23)  follow-up: 7/10 (03/08/23)  follow-up: 7/10 no change due to winter weather (04/30/23)     3.    Drive long distances                                          follow up: 4/10 (01/15/23)    follow-up: not doing this    (03/08/23)    follow-up: not doing this, short distance driving is 1/61, long distance 4/10 (04/30/23)  COGNITION: Overall cognitive status: Within functional limits for tasks assessed  SENSATION: WFL  POSTURE: rounded shoulders and forward head  PALPATION: Diffuse palpable tenderness over bil neck, upper traps, thoracic, lumbar and gluteals with trigger points.    CERVICAL ROM:   Active ROM A/ROM 04/20/22 A/ROM 08/07/22  Flexion  60  Extension    Right lateral flexion 40 41  Left lateral flexion 45 50  Right rotation 65 70  Left rotation 65 75   (Blank rows = not tested) TMJ: opening limited by 25%, clicking with opening on the Rt.  Palpable tenderness over Rt masseter and at TMJ UPPER EXTREMITY ROM: UE A/ROM is limited by 20% into  flexion and abduction with pain at end range.  Hip flexibility is limited by 25% in all directions with pain in all directions.  UPPER EXTREMITY MMT: UE: 4+/5, LE 4+/5 except hip flexors 4-/5  TODAY'S TREATMENT:   06/19/22: NuStep: Level 4 x 10 minutes for endurance and mobility-PT present to discuss progress. Prone quad stretch with strap 10x10 seconds  Prone hip extension 2x10 bil each Seated hamstring and piriformis stretch 3x20 seconds  Sidelying clams 2x10 Manual: passive hip distraction and IR/ER mobs, tissue mobilization to Lt hip flexor 05/30/22: NuStep: Level 4 x 10 minutes for endurance and mobility-PT present to discuss progress. Ball roll outs forward and lateral x10 each Seated hamstring and piriformis stretch 3x20 seconds  Sit to stand: x15 TMJ assessment:  Pain with inferior and distraction with anterior movement on Rt, no pain with lateral glide, tender over masseter and internal/external temporalis on Rt Hinge technique for TMJ opening  [email or web address] issued self mobilzation for TMJ video Trigger Point Dry Needling  Subsequent Treatment: Instructions provided previously at initial dry needling treatment.   Patient Verbal Consent Given: Yes Education Handout Provided: Previously Provided Muscles Treated: Rt and Lt masseter Electrical Stimulation Performed: No Treatment Response/Outcome: elongation and release to treated muscles after dry needling- internal masseter and temporalis insertions   04/30/23: Self care: education regarding stress management and avoidance of parafunction that will aggravate her jaw and neck/head Manual:  Elongation and release with cervical mobs- tender on the Rt   04/11/22: NuStep: Level 3 x 10 minutes for endurance and mobility-PT present to discuss progress. Ball roll outs forward and lateral x10 each Seated on green ball: 2# 3 way raises 2x10 Sit to stand with 5# and upright row: 2x10   Manual:   Elongation and release with cervical mobs    HOME EXERCISE PROGRAM: Access Code: [email or web address] Access Code: [email or web address] URL: [email or web address] Date: 09/06/2022 Prepared by: Loetta Ringer                - Standing Hip Abduction with Counter Support  - 1 x daily - 7 x weekly - 1-2 sets - 10 reps - Standing Hip Extension with Chair  - 1 x daily - 7 x weekly - 1-2 sets - 10 reps ASSESSMENT:  CLINICAL IMPRESSION: Pt had good response to TMJ treatment last session and no longer has pain.  Pt with continued pain in the Lt hip after her fall last month.  She remains active with stretching and strength. Pt tolerated exercise well today and PT provided manual therapy or hip mobility and focused exercise to target this area.  Patient will benefit from skilled PT to address the below impairments and improve overall function.   OBJECTIVE IMPAIRMENTS decreased activity tolerance, decreased endurance, difficulty walking, decreased strength, increased muscle spasms, impaired flexibility, improper body mechanics, postural dysfunction,  and pain.   ACTIVITY LIMITATIONS carrying, lifting, sitting, standing, reach over head, hygiene/grooming, and locomotion level  PARTICIPATION LIMITATIONS: meal prep, cleaning, laundry, driving, community activity, and yard work  PERSONAL FACTORS Past/current experiences, Time since onset of injury/illness/exacerbation, and 3+ comorbidities: fibromyalgia depression, anxiety,   are also affecting patient's functional outcome.   REHAB POTENTIAL: Good  CLINICAL DECISION MAKING: Evolving/moderate complexity  EVALUATION COMPLEXITY: Moderate   GOALS: Goals reviewed with patient? Yes  SHORT TERM GOALS: Target date: 12/22/22  Return to regular performance of HEP 4-5x/week to improve mobility  Baseline: met since last visit (12/06/22) Goal status: In progress  2.  Rate lifting (groceries and laundry) > or = to 3/10 on patient specific functional scale (PSFS) Baseline:  2/10 Goal status: NEW  3.  Initiate walking program for exercise > or = to 2x/wk Baseline: not walking  Goal status: NEW    LONG TERM GOALS: Target date: 07/27/23  Be independent in advanced HEP Baseline: exercising when pain and mental health allows, PT assessing every 3 weeks (04/30/23) Goal status: In progress   2.  Rate squatting for yard work > or = to 8/10 on PSFS Baseline: 7/10 (04/30/23) Goal status: Revised   3.  Rate driving > or = to 2/53 on PSFS Baseline: hasn't been driving, taking train for long trips (03/08/23) Goal status: in progress    4.  Walk for exercise and perform regular exercises 3-5x/wk for exercise to improve endurance  Baseline: active with her grandchildren, walks as weather arrives (04/30/23) Goal status: In progress    5.  Rate lifting (groceries and laundry) > or = to 6/10 on PSFS  Baseline: 5/10  (04/30/23) Goal status: revised     PLAN: PT FREQUENCY: every other week  PT DURATION: 8 weeks  PLANNED INTERVENTIONS: Therapeutic exercises, Therapeutic activity, Neuromuscular re-education, Balance training, Gait training, Patient/Family education, Self Care, Joint mobilization, Aquatic Therapy, Dry Needling, Spinal manipulation, Spinal mobilization, Cryotherapy, Moist heat, Taping, Traction, Manual therapy, and Re-evaluation  PLAN FOR NEXT SESSION:  1 visit every 2-3 weeks- modify HEP, manual for pain.  1-2 sessions in pool due to pain from recent fall.   Luella Sager, PT 06/19/23 1:32 PM

## 2023-06-19 NOTE — Progress Notes (Unsigned)
                edge,  experiencing concentration difficulties, having trouble falling or staying asleep, exhibiting a general  state of irritability).: No Description Entered (Status: improved). Motor tension (e.g., restlessness,  tiredness, shakiness, muscle tension).: No Description Entered (Status: improved).  Problems Addressed  Anxiety, Phase Of Life Problems, Anxiety  Goals 1. Learn and implement coping skills that result in a reduction of anxiety  and worry, and improved daily functioning. Objective Learn  and implement calming skills to reduce overall anxiety and manage anxiety symptoms. Target Date: 2025-08-09Frequency: Weekly Progress: 40 Modality: individual  Related Interventions 1. Teach the client calming/relaxation skills (e.g., applied relaxation, progressive muscle  relaxation, cue controlled relaxation; mindful breathing; biofeedback) and how to discriminate  better between relaxation and tension; teach the client how to apply these skills to his/her daily  life (e.g., New Directions in Progressive Muscle Relaxation by Marcelyn Ditty, and  Hazlett-Stevens; Treating Generalized Anxiety Disorder by Rygh and Ida Rogue). Objective Identify, challenge, and replace biased, fearful self-talk with positive, realistic, and empowering selftalk. Target Date: 2023-10-06 Frequency: weekly Progress: 30 Modality: individual Related Interventions 1. Explore the client's schema and self-talk that mediate his/her fear response; assist him/her in  challenging the biases; replace the distorted messages with reality-based alternatives and  positive, realistic self-talk that will increase his/her self-confidence in coping with irrational  fears (see Cognitive Therapy of Anxiety Disorders by Laurence Slate). Objective Learn and implement problem-solving strategies for realistically addressing worries. Target Date: 2025-08-09Frequency: weekly Progress: 40 Modality: individual 2. Resolve conflicted feelings and adapt to the new life circumstances. Objective Apply problem-solving skills to current circumstances. Target Date: 2023-10-06 Frequency: weekly Progress: 20 Modality: individual Related Interventions 1. Teach the client problem-resolution skills (e.g., defining the problem clearly, brainstorming  multiple solutions, listing the pros and cons of each solution, seeking input from others,  selecting and implementing a plan of action, evaluating outcome, and readjusting plan as   necessary).   3. Stabilize anxiety level while increasing ability to function on a daily  basis. Diagnosis F33.1  Major depressive disorder, moderate 300.02 (Generalized anxiety disorder) - Open - [Signifier: n/a]  Axis  none 309.28 (Adjustment disorder with mixed anxiety and depressed  mood) - Open - [Signifier: n/a]  Adjustment Disorder,  With Anxiety   Marital conflict  Major Depressive disorder, moderate  Conditions For Discharge Achievement of treatment goals and objectives.  The patient approved this plan.   Deonna Krummel G Ethridge Sollenberger, LCSW

## 2023-06-20 ENCOUNTER — Other Ambulatory Visit: Payer: Self-pay | Admitting: Family Medicine

## 2023-06-20 DIAGNOSIS — R5381 Other malaise: Secondary | ICD-10-CM

## 2023-06-20 DIAGNOSIS — Q838 Other congenital malformations of breast: Secondary | ICD-10-CM

## 2023-06-21 ENCOUNTER — Other Ambulatory Visit: Payer: Self-pay | Admitting: Family Medicine

## 2023-06-21 DIAGNOSIS — M810 Age-related osteoporosis without current pathological fracture: Secondary | ICD-10-CM | POA: Diagnosis not present

## 2023-06-21 DIAGNOSIS — N6489 Other specified disorders of breast: Secondary | ICD-10-CM

## 2023-06-21 DIAGNOSIS — Q838 Other congenital malformations of breast: Secondary | ICD-10-CM

## 2023-06-21 DIAGNOSIS — F900 Attention-deficit hyperactivity disorder, predominantly inattentive type: Secondary | ICD-10-CM | POA: Diagnosis not present

## 2023-06-21 DIAGNOSIS — G43909 Migraine, unspecified, not intractable, without status migrainosus: Secondary | ICD-10-CM | POA: Diagnosis not present

## 2023-06-21 DIAGNOSIS — J309 Allergic rhinitis, unspecified: Secondary | ICD-10-CM | POA: Diagnosis not present

## 2023-06-21 DIAGNOSIS — F411 Generalized anxiety disorder: Secondary | ICD-10-CM | POA: Diagnosis not present

## 2023-06-21 DIAGNOSIS — M25562 Pain in left knee: Secondary | ICD-10-CM | POA: Diagnosis not present

## 2023-06-26 ENCOUNTER — Ambulatory Visit (INDEPENDENT_AMBULATORY_CARE_PROVIDER_SITE_OTHER): Payer: Medicare Other | Admitting: Psychology

## 2023-06-26 DIAGNOSIS — F902 Attention-deficit hyperactivity disorder, combined type: Secondary | ICD-10-CM | POA: Diagnosis not present

## 2023-06-26 DIAGNOSIS — F331 Major depressive disorder, recurrent, moderate: Secondary | ICD-10-CM

## 2023-06-26 DIAGNOSIS — F428 Other obsessive-compulsive disorder: Secondary | ICD-10-CM | POA: Diagnosis not present

## 2023-06-26 DIAGNOSIS — F4310 Post-traumatic stress disorder, unspecified: Secondary | ICD-10-CM | POA: Diagnosis not present

## 2023-06-27 ENCOUNTER — Encounter: Payer: Self-pay | Admitting: Physical Therapy

## 2023-06-27 ENCOUNTER — Ambulatory Visit: Admitting: Physical Therapy

## 2023-06-27 DIAGNOSIS — R252 Cramp and spasm: Secondary | ICD-10-CM

## 2023-06-27 DIAGNOSIS — R293 Abnormal posture: Secondary | ICD-10-CM

## 2023-06-27 DIAGNOSIS — M542 Cervicalgia: Secondary | ICD-10-CM | POA: Diagnosis not present

## 2023-06-27 DIAGNOSIS — M6281 Muscle weakness (generalized): Secondary | ICD-10-CM

## 2023-06-27 DIAGNOSIS — M353 Polymyalgia rheumatica: Secondary | ICD-10-CM | POA: Diagnosis not present

## 2023-06-27 NOTE — Therapy (Signed)
 OUTPATIENT PHYSICAL THERAPY TREATMENT     Patient Name: Danielle Bruce MRN: [id number] DOB:Jun 29, 1960, 63 y.o., female Today's Date: 06/27/2023  PT End of Session - 06/27/23 1030     Visit Number 72    Date for PT Re-Evaluation 07/27/23    Authorization Type Medicare B- KX at 15 for 2025 (5/15)    Progress Note Due on Visit 78    PT Start Time 0930    PT Stop Time 1015    PT Time Calculation (min) 45 min    Activity Tolerance Patient tolerated treatment well    Behavior During Therapy [facility] for tasks assessed/performed                 Past Medical History:  Diagnosis Date   Abdominal pain, unspecified site 10/18/2012   Anemia    Anxiety    Arthritis    osteoarthritis   ASCUS (atypical squamous cells of undetermined significance) on Pap smear 05/06/2005   NEG HIGH RISK HPV--C&B BIOPSY BENIGN 12/2005   Asthma    Bipolar 2 disorder (HCC)    Cancer (HCC)    skin cancer - basal cell   Colon polyps    Complication of anesthesia    anxious afterwards, will get headaches    Constipation    Depression    Fibromyalgia 10/2013   GERD (gastroesophageal reflux disease)    Hearing loss on left    Heart murmur    never had any problems   Hemorrhoids    High cholesterol    High risk HPV infection 08/2011   cytology negative   IBS (irritable bowel syndrome)    Insomnia    Lymphocytic colitis    Lymphoma (HCC)    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   Bone marrow biopsy showes 8% plasma cells IgA Lambda   Migraines    Nausea alone 10/18/2012   Osteoarthritis    Osteopenia    Peripheral neuropathy    PTSD (post-traumatic stress disorder)    Past Surgical History:  Procedure Laterality Date   BONE MARROW BIOPSY Left 12/18/2013   Plasma cell dyscrasia 8% population of plasma cells   BREAST BIOPSY Right    benign stereo   CESAREAN SECTION  40,98,11   CHOLECYSTECTOMY N/A 10/16/2012   Procedure: LAPAROSCOPIC CHOLECYSTECTOMY;  Surgeon: Brandy Cal. Cornett, MD;  Location: [facility];  Service: General;  Laterality: N/A;   COLONOSCOPY     numerous times   DILATION AND CURETTAGE OF UTERUS     ESOPHAGOGASTRODUODENOSCOPY     HEMORRHOID SURGERY  1993   x3   IUD REMOVAL  02/2015   Mirena   LAPAROSCOPIC LYSIS OF ADHESIONS N/A 10/16/2012   Procedure: LAPAROSCOPIC LYSIS OF ADHESIONS;  Surgeon: Andy Bannister A. Cornett, MD;  Location: [facility];  Service: General;  Laterality: N/A;   LAPAROSCOPY N/A 10/16/2012   Procedure: LAPAROSCOPY DIAGNOSTIC;  Surgeon: Brandy Cal. Cornett, MD;  Location: [facility];  Service: General;  Laterality: N/A;   PELVIC LAPAROSCOPY     RADIOLOGY WITH ANESTHESIA N/A 12/16/2015   Procedure: MRI OF BRAIN WITH AND WITHOUT CONTRAST;  Surgeon: Medication Radiologist, MD;  Location: [facility];  Service: Radiology;  Laterality: N/A;   SHOULDER SURGERY  2007/2008   SPINE SURGERY  2010   cervical   Patient Active Problem List   Diagnosis Date Noted   GAD (generalized anxiety disorder) 12/26/2017   OCD (obsessive compulsive disorder) 12/26/2017   PTSD (post-traumatic stress disorder) 12/26/2017   DDD (degenerative  disc disease), cervical 07/14/2016   Primary osteoarthritis of both feet 07/14/2016   Primary osteoarthritis of both hands 07/14/2016   Other fatigue 07/14/2016   History of IBS 07/14/2016   Osteopenia of multiple sites 07/14/2016   Fibromyalgia 01/13/2016   MGUS (monoclonal gammopathy of unknown significance) 12/23/2013   Chronic cholecystitis without calculus 10/18/2012   Abdominal pain 10/18/2012   Nausea alone 10/18/2012   Constipation 10/18/2012   Depression 10/18/2012   Anxiety    ASCUS (atypical squamous cells of undetermined significance) on Pap smear    IUD    Hemorrhoids 11/29/2010   Abdominal pain, left upper quadrant 11/29/2010    PCP:  Glena Landau, MD  REFERRING PROVIDER: Glena Landau, MD  REFERRING DIAG: fibromyalgia, TMJ dysfunction (added 04/20/22)  THERAPY DIAG:  Abnormal posture  Cramp and  spasm  Muscle weakness (generalized)  Polymyalgia rheumatica (HCC)  Rationale for Evaluation and Treatment Rehabilitation  ONSET DATE: chronic pain (fibromyalgia) with flare-up 2 months ago  SUBJECTIVE:                                                                                                                                                                                                         SUBJECTIVE STATEMENT: Leg and hip are about a 3-4 today.   PERTINENT HISTORY:  Anxiety, bipolar disorder, depression, fibromyalgia, IBS, migraines, osteopenia, PTSD  PAIN: 06/19/23 Are you having pain: yes Pain location: Lt hips/knees (3-4/10),   Pain rating: see above Pain description: irritating, annoying   Aggravating factors: doing too much Relieving factors: muscle relaxers, rest, stretching  PRECAUTIONS: Other: chronic pain syndrome and depression Other: slow progression with exercise due to chronic condition  WEIGHT BEARING RESTRICTIONS No  FALLS:  Has patient fallen in last 6 months? No  LIVING ENVIRONMENT: Lives with: lives with their family Lives in: House/apartment  OCCUPATION: on disability  PLOF: Independent with basic ADLs and Leisure: yardwork, walking  Pt cares for her 4 young grandchildren- difficulty with care including lifting and carrying  PATIENT GOALS be more active with less pain, exercise at the gym regularly, lift and carry grandchildren and laundry, sleep with fewer interruptions, get back to more gardening.  OBJECTIVE:   DIAGNOSTIC FINDINGS: none recent   PATIENT SURVEYS:  The Patient-Specific Functional Scale  Initial:  I am going to ask you to identify up to 3 important activities that you are unable to do or are having difficulty with as a result of this problem.  Today are there any activities that you are unable to do or having difficulty with because  of this?  (Patient shown scale and patient rated each activity)  Follow up: When you  first came in you had difficulty performing these activities.  Today do you still have difficulty?  Patient-Specific activity scoring scheme (Point to one number):  0 1 2 3 4 5 6 7 8 9  10 Unable                                                                                                          Able to perform To perform                                                                                                    activity at the same Activity         Level as before                                                                                                                       Injury or problem  Activity   Lifting laundry and groceries                           follow up:  2 (01/15/23)  follow up: 3 (03/08/23)   follow up: 5/10 (3/325)  2.    Yard work- up and down with squatting           follow up: 7/10 (01/15/23)  follow-up: 7/10 (03/08/23)  follow-up: 7/10 no change due to winter weather (04/30/23)     3.    Drive long distances                                          follow up: 4/10 (01/15/23)    follow-up: not doing this    (03/08/23)    follow-up: not doing this, short distance driving is 1/61, long distance 4/10 (04/30/23)    COGNITION: Overall cognitive status: Within functional limits for tasks assessed  SENSATION: WFL  POSTURE: rounded shoulders and forward head  PALPATION: Diffuse  palpable tenderness over bil neck, upper traps, thoracic, lumbar and gluteals with trigger points.    CERVICAL ROM:   Active ROM A/ROM 04/20/22 A/ROM 08/07/22  Flexion  60  Extension    Right lateral flexion 40 41  Left lateral flexion 45 50  Right rotation 65 70  Left rotation 65 75   (Blank rows = not tested) TMJ: opening limited by 25%, clicking with opening on the Rt.  Palpable tenderness over Rt masseter and at TMJ UPPER EXTREMITY ROM: UE A/ROM is limited by 20% into flexion and abduction with pain at end range.  Hip flexibility is limited by 25% in all directions with pain  in all directions.  UPPER EXTREMITY MMT: UE: 4+/5, LE 4+/5 except hip flexors 4-/5  TODAY'S TREATMENT:   06/27/23:Pt arrives for aquatic physical therapy. Treatment took place in 3.5-5.5 feet of water. Water temperature was 91 degrees F. Pt entered the pool via stairs independently. Seated water bench with 75% submersion Pt performed seated LE AROM exercises 20x in all planes, concurrent discussion of current status including her leg symptoms. Lymphatic stimulation of groin and posterior knee followed by 10 ankle to groin strokes. Posterior knee soft tissue to distal hamstring and proximla gastroc. Pt requires buoyancy of water for support and to offload joints with strengthening exercises.   75% depth water walking with UE push/pull 6x in each direction. Lower LT leg "feels it" towards end of reps. Seated float 5 min to rest LTLE. Soft tissue with large jet to lateral glutes 3 min.   06/19/22: NuStep: Level 4 x 10 minutes for endurance and mobility-PT present to discuss progress. Prone quad stretch with strap 10x10 seconds  Prone hip extension 2x10 bil each Seated hamstring and piriformis stretch 3x20 seconds  Sidelying clams 2x10 Manual: passive hip distraction and IR/ER mobs, tissue mobilization to Lt hip flexor 05/30/22: NuStep: Level 4 x 10 minutes for endurance and mobility-PT present to discuss progress. Ball roll outs forward and lateral x10 each Seated hamstring and piriformis stretch 3x20 seconds  Sit to stand: x15 TMJ assessment:  Pain with inferior and distraction with anterior movement on Rt, no pain with lateral glide, tender over masseter and internal/external temporalis on Rt Hinge technique for TMJ opening  [email or web address] issued self mobilzation for TMJ video Trigger Point Dry Needling  Subsequent Treatment: Instructions provided previously at initial dry needling treatment.   Patient Verbal Consent Given: Yes Education Handout Provided:  Previously Provided Muscles Treated: Rt and Lt masseter Electrical Stimulation Performed: No Treatment Response/Outcome: elongation and release to treated muscles after dry needling- internal masseter and temporalis insertions   04/30/23: Self care: education regarding stress management and avoidance of parafunction that will aggravate her jaw and neck/head Manual:  Elongation and release with cervical mobs- tender on the Rt    HOME EXERCISE PROGRAM: Access Code: [email or web address] Access Code: [email or web address] URL: [email or web address] Date: 09/06/2022 Prepared by: Loetta Ringer                - Standing Hip Abduction with Counter Support  - 1 x daily - 7 x weekly - 1-2 sets - 10 reps - Standing Hip Extension with Chair  - 1 x daily - 7 x weekly - 1-2 sets - 10 reps ASSESSMENT:  CLINICAL IMPRESSION: Pt returns to aquatic PT with lower leg and hip "discomfort: on the left. Posterior Lt knee tendons felt much thicker than on the right, worked on that a bit to ease lower leg symptoms. Water walking aggrevated the lower  leg a bit, but this seemed to calm down with some rest and NWB float.  OBJECTIVE IMPAIRMENTS decreased activity tolerance, decreased endurance, difficulty walking, decreased strength, increased muscle spasms, impaired flexibility, improper body mechanics, postural dysfunction, and pain.   ACTIVITY LIMITATIONS carrying, lifting, sitting, standing, reach over head, hygiene/grooming, and locomotion level  PARTICIPATION LIMITATIONS: meal prep, cleaning, laundry, driving, community activity, and yard work  PERSONAL FACTORS Past/current experiences, Time since onset of injury/illness/exacerbation, and 3+ comorbidities: fibromyalgia depression, anxiety,   are also affecting patient's functional outcome.   REHAB POTENTIAL: Good  CLINICAL DECISION MAKING: Evolving/moderate complexity  EVALUATION COMPLEXITY: Moderate   GOALS: Goals reviewed with patient? Yes  SHORT TERM GOALS: Target  date: 12/22/22  Return to regular performance of HEP 4-5x/week to improve mobility  Baseline: met since last visit (12/06/22) Goal status: In progress  2.  Rate lifting (groceries and laundry) > or = to 3/10 on patient specific functional scale (PSFS) Baseline: 2/10 Goal status: NEW  3.  Initiate walking program for exercise > or = to 2x/wk Baseline: not walking  Goal status: NEW    LONG TERM GOALS: Target date: 07/27/23  Be independent in advanced HEP Baseline: exercising when pain and mental health allows, PT assessing every 3 weeks (04/30/23) Goal status: In progress   2.  Rate squatting for yard work > or = to 8/10 on PSFS Baseline: 7/10 (04/30/23) Goal status: Revised   3.  Rate driving > or = to 4/09 on PSFS Baseline: hasn't been driving, taking train for long trips (03/08/23) Goal status: in progress    4.  Walk for exercise and perform regular exercises 3-5x/wk for exercise to improve endurance  Baseline: active with her grandchildren, walks as weather arrives (04/30/23) Goal status: In progress    5.  Rate lifting (groceries and laundry) > or = to 6/10 on PSFS  Baseline: 5/10  (04/30/23) Goal status: revised     PLAN: PT FREQUENCY: every other week  PT DURATION: 8 weeks  PLANNED INTERVENTIONS: Therapeutic exercises, Therapeutic activity, Neuromuscular re-education, Balance training, Gait training, Patient/Family education, Self Care, Joint mobilization, Aquatic Therapy, Dry Needling, Spinal manipulation, Spinal mobilization, Cryotherapy, Moist heat, Taping, Traction, Manual therapy, and Re-evaluation  PLAN FOR NEXT SESSION: See how the Lt leg did with the water stimulation, add water ballet if leg was no worse.  Bethanne Brooks, PTA 06/27/23 10:34 AM

## 2023-06-29 NOTE — Progress Notes (Signed)
 [facility] Counselor/Therapist Progress Note  Patient ID: ENVI BIBEY, MRN: [id number],    Date:06/26/2023  Time Spent: 61 minutes Time In:  3:00 Time out: 4:01  Treatment Type: Individual Therapy  Reported Symptoms: flashbacks, responding to triggers, depression, anxiety, being busy all of the time, dissociation  Mental Status Exam: Appearance:  Casual     Behavior: Appropriate  Motor: normal  Speech/Language:  Clear and Coherent  Affect: Blunt  Mood: anxious  Thought process: normal  Thought content:   WNL  Sensory/Perceptual disturbances:   WNL  Orientation: oriented to person, place, time/date, and situation  Attention: Good  Concentration: Good  Memory: WNL  Fund of knowledge:  Good  Insight:   Good  Judgment:  Fair  Impulse Control: Good   Risk Assessment: Danger to Self:  No Self-injurious Behavior: No Danger to Others: No Duty to Warn:no Physical Aggression / Violence:No  Access to Firearms a concern: No  Gang Involvement:No   Subjective: The patient attended a face-to-face individual therapy the office today.  The patient reports that she is feeling somewhat anxious.  She states that she is concerned about her transgendered daughter, Lequita Ranger.  She talked about some of the symptoms that Navos is having and it seems that [facility] may be having some delusions and there is some concern that that could be something related to medication or need for some more intensive therapy.  We discussed the different resources in the community and I recommended that the patient take [facility] to the behavioral health crisis center on [address] to be evaluated for the potential of some sort of higher level of care such as intensive outpatient or partial hospital.  The patient was appreciative of the information and felt that it was very helpful to talk about this today.  Interventions: Cognitive Behavioral Therapy, Mindfulness Meditation, Eye Movement Desensitization  and Reprocessing (EMDR), Insight-Oriented, and Interpersonal, psychoeducation  Diagnosis:Major depressive disorder, recurrent episode, moderate (HCC)  Attention deficit hyperactivity disorder (ADHD), combined type  Obsessional thoughts  PTSD (post-traumatic stress disorder)  Plan: Plan of Care: Client Abilities/Strengths  Insightful, motivated, Supportive family Client Treatment Preferences  Outpatient Individual therapy/EMDR  Client Statement of Needs  "I think I have PTSD and I feel like I need another kind of therapy than talk therapy" Treatment Level  Outpatient Individual therapy  Symptoms  Demonstrates an exaggerated startle response.:  (Status: maintained). Depressed  or irritable mood.: (Status:maintained). Describes a reliving of the event,  particularly through dissociative flashbacks.: (Status: maintained). Displays a  significant decline in interest and engagement in activities.:  (Status: maintained). Displays significant psychological and/or physiological distress resulting from internal and external  clues that are reminiscent of the traumatic event.: (Status: maintained).  Experiences disturbances in sleep.:  (Status: maintained). Experiences disturbing  and persistent thoughts, images, and/or perceptions of the traumatic event.:  (Status: maintained). Experiences frequent nightmares.: (Status: maintained).  Feelings of hopelessness, worthlessness, or inappropriate guilt.: No Description Entered (Status:  improved). Has been exposed to a traumatic event involving actual or perceived threat of death or  serious injury.:  (Status: maintained). Impairment in social, occupational, or  other areas of functioning.:(Status: maintained). Intentionally avoids activities,  places, people, or objects (e.g., up-armored vehicles) that evoke memories of the event.: (Status: maintained). Intentionally avoids thoughts, feelings, or discussions related  to the traumatic event.:  (Status: maintained). Reports difficulty concentrating as  well as feelings of guilt (Status:maintained). Reports response of intense fear,  helplessness, or horror to the traumatic event.:  (  Status: maintained).  Problems Addressed  Unipolar Depression, Posttraumatic Stress Disorder (PTSD), Posttraumatic Stress Disorder (PTSD),   Posttraumatic Stress Disorder (PTSD), Posttraumatic Stress Disorder (PTSD)  Goals 1. Develop healthy thinking patterns and beliefs about self, others, and the world that lead to the alleviation and help prevent the relapse of  depression. Objective Identify and replace thoughts and beliefs that support depression. Target Date: 2024/01/04 Frequency: Weekly Progress: 60 Modality: individual Related Interventions 1. Explore and restructure underlying assumptions and beliefs reflected in biased self-talk that  may put the client at risk for relapse or recurrence. 2. Conduct Cognitive-Behavioral Therapy (see Cognitive Behavior Therapy by Kerrie Peek; Overcoming Depression by Duke Gibbons al.), beginning with helping the client learn the connection among  cognition, depressive feelings, and actions. 2. Eliminate or reduce the negative impact trauma related symptoms have  on social, occupational, and family functioning. Objective Learn and implement personal skills to manage challenging situations related to trauma. Target Date: 2024/01/04 Frequency: Weekly Progress: 0 Modality: individual 3. No longer avoids persons, places, activities, and objects that are  reminiscent of the traumatic event. Objective Participate in Eye Movement Desensitization and Reprocessing (EMDR) to reduce emotional distress  related to traumatic thoughts, feelings, and images. Target Date: 2024/01/04 Frequency: Weekly Progress: 0 Modality: individual   Related Interventions 1. Utilize Eye Movement Desensitization and Reprocessing (EMDR) to reduce the client's  emotional reactivity to the  traumatic event and reduce PTSD symptoms. Objective Learn and implement guided self-dialogue to manage thoughts, feelings, and urges brought on by  encounters with trauma-related situations. Target Date: 2025-11/07 Frequency: Weekly Progress: 10 Modality: individual Related Interventions 1. Teach the client a guided self-dialogue procedure in which he/she learns to recognize  maladaptive self-talk, challenges its biases, copes with engendered feelings, overcomes  avoidance, and reinforces his/her accomplishments; review and reinforce progress, problemsolve obstacles. 4. No longer experiences intrusive event recollections, avoidance of event  reminders, intense arousal, or disinterest in activities or  relationships. 5. Thinks about or openly discusses the traumatic event with others  without experiencing psychological or physiological distress. Diagnosis Axis  none F43.10 (Posttraumatic stress disorder) - Open - [Signifier: n/a] Posttraumatic Stress  Disorder  Conditions For Discharge Achievement of treatment goals and objectives   Deziya Amero G Strummer Canipe, LCSW

## 2023-07-01 ENCOUNTER — Other Ambulatory Visit: Payer: Self-pay | Admitting: Psychiatry

## 2023-07-01 DIAGNOSIS — F4001 Agoraphobia with panic disorder: Secondary | ICD-10-CM

## 2023-07-01 DIAGNOSIS — F411 Generalized anxiety disorder: Secondary | ICD-10-CM

## 2023-07-03 ENCOUNTER — Ambulatory Visit (INDEPENDENT_AMBULATORY_CARE_PROVIDER_SITE_OTHER): Payer: Medicare Other | Admitting: Psychology

## 2023-07-03 DIAGNOSIS — F331 Major depressive disorder, recurrent, moderate: Secondary | ICD-10-CM | POA: Diagnosis not present

## 2023-07-03 DIAGNOSIS — F428 Other obsessive-compulsive disorder: Secondary | ICD-10-CM | POA: Diagnosis not present

## 2023-07-03 DIAGNOSIS — F4310 Post-traumatic stress disorder, unspecified: Secondary | ICD-10-CM | POA: Diagnosis not present

## 2023-07-03 DIAGNOSIS — F902 Attention-deficit hyperactivity disorder, combined type: Secondary | ICD-10-CM
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
## 2023-07-04 ENCOUNTER — Ambulatory Visit: Payer: Self-pay | Attending: Family Medicine | Admitting: Physical Therapy

## 2023-07-04 ENCOUNTER — Encounter: Payer: Self-pay | Admitting: Physical Therapy

## 2023-07-04 DIAGNOSIS — M353 Polymyalgia rheumatica: Secondary | ICD-10-CM | POA: Insufficient documentation

## 2023-07-04 DIAGNOSIS — R293 Abnormal posture: Secondary | ICD-10-CM | POA: Diagnosis not present

## 2023-07-04 DIAGNOSIS — M6281 Muscle weakness (generalized): Secondary | ICD-10-CM | POA: Diagnosis not present

## 2023-07-04 DIAGNOSIS — R252 Cramp and spasm: Secondary | ICD-10-CM | POA: Diagnosis not present
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
## 2023-07-05 ENCOUNTER — Encounter: Payer: Self-pay | Admitting: Psychiatry

## 2023-07-05 ENCOUNTER — Ambulatory Visit

## 2023-07-05 ENCOUNTER — Ambulatory Visit
Admission: RE | Admit: 2023-07-05 | Discharge: 2023-07-05 | Disposition: A | Discharge status: Home / Self Care | Source: Ambulatory Visit | Attending: Family Medicine | Admitting: Family Medicine

## 2023-07-05 ENCOUNTER — Ambulatory Visit: Admitting: Psychiatry

## 2023-07-05 DIAGNOSIS — M797 Fibromyalgia: Secondary | ICD-10-CM

## 2023-07-05 DIAGNOSIS — F331 Major depressive disorder, recurrent, moderate: Secondary | ICD-10-CM | POA: Diagnosis not present

## 2023-07-05 DIAGNOSIS — F902 Attention-deficit hyperactivity disorder, combined type: Secondary | ICD-10-CM

## 2023-07-05 DIAGNOSIS — F4001 Agoraphobia with panic disorder: Secondary | ICD-10-CM

## 2023-07-05 DIAGNOSIS — F5105 Insomnia due to other mental disorder: Secondary | ICD-10-CM | POA: Diagnosis not present

## 2023-07-05 DIAGNOSIS — F422 Mixed obsessional thoughts and acts: Secondary | ICD-10-CM

## 2023-07-05 DIAGNOSIS — F4310 Post-traumatic stress disorder, unspecified: Secondary | ICD-10-CM | POA: Diagnosis not present

## 2023-07-05 DIAGNOSIS — Q838 Other congenital malformations of breast: Secondary | ICD-10-CM

## 2023-07-05 DIAGNOSIS — F411 Generalized anxiety disorder: Secondary | ICD-10-CM

## 2023-07-05 DIAGNOSIS — F50811 Binge eating disorder, moderate: Secondary | ICD-10-CM

## 2023-07-05 DIAGNOSIS — R7989 Other specified abnormal findings of blood chemistry: Secondary | ICD-10-CM

## 2023-07-05 DIAGNOSIS — R928 Other abnormal and inconclusive findings on diagnostic imaging of breast: Secondary | ICD-10-CM | POA: Diagnosis not present

## 2023-07-05 DIAGNOSIS — N6489 Other specified disorders of breast: Secondary | ICD-10-CM

## 2023-07-05 MED ORDER — AMPHETAMINE-DEXTROAMPHET ER 20 MG PO CP24: 20.0000 mg | ORAL_CAPSULE | Freq: Every day | ORAL | 0 refills | Status: DC

## 2023-07-05 MED ORDER — FLUVOXAMINE MALEATE 25 MG PO TABS: 75.0000 mg | ORAL_TABLET | Freq: Every day | ORAL | 1 refills | Status: DC

## 2023-07-05 NOTE — Progress Notes (Signed)
 Danielle Danielle Bruce [id number] Jan 17, 1961 63 y.o.   Subjective:   Patient ID:  Danielle Danielle Bruce is a 63 y.o. (DOB 05/06/1960) female.  Chief Complaint:  Chief Complaint  Patient presents with   Follow-up   Medication Reaction    HPI Danielle Danielle Bruce presents for follow-up of multiple dxes.  visit in April and taken off lamotrigine  DT possible skin reaction.  Had appt with [facility] and they assume that skin reaction with itching was related to lamotrigine . 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 Per oncology most likely drug is lamotrigine . And she weaned off of the medication to determine if the cutaneous lesions are related. Lesions seem less without lamotrigine .  At this point skin reaction is presumed related to lamotrigine .  No other disease sx. She is well aware that she has been on lamotrigine  for many years with good results for depression control. There is a risk of relapse as she comes off of the medication and she is worried about that. Smart phone helps her cognitive  And other productivity.    seen October 2020.  Restarted low dosage ADD bc concerns over STM, concentration and productivity.  Ritalin  5 mg twice daily.  Also suggested she retry N-acetylcysteine for mild cognitive complaints.   seen April 07, 2019 the following was noted: Mostly good.  Anxiety is mostly better, but depression is a little worse seasonally.  Not able to go help her daughter.   Low energy but does have motivation.  Ritalin  helps energy and focus but doesn't take Danielle Bruce regularly bc irregular sleep cycle.   Ativan  only when having medical issues.    Therapy working on trauma issues is very hard.  Struggling with the fact she doesn't have memory of large parts of her past.   No manic sx off mood stabilizers.  Covid isolation reduce stressors overall and not prominently  depressed.   No meds were changed.  June 25, 2019 appointment the following is noted: F real worried about Covid despite vaccination.   Started fluvoxamine  25 on May 14, 2019 from PCP mainly for inflammation but possibly thinking she might be depressed. Seen benefit for energy and sleep pattern and more appropriate feelings and less numb.  Now realizes she had been depressed for awhile but just numb with depression.  Anniversaries of deaths of friends including last BF Danielle Danielle Bruce a little over a year ago.   Thinks of him daily. His 61 yo D died of unknown cause recently.  Stress D moved to beach. Wonders about increasing the fluvoxamine   Danielle Danielle Bruce in [place] and hasn't seen him in a year. Danielle Danielle Bruce for depression. Patient reports more depression.  Plan: Realized lately she's depressed and started Luvox  again for Danielle Bruce and for the anti-inflammatory potential for her health.  Has seen some benefit.   Prior benefit at 25 mg daily.  Optiion increase but slightly bc med sensitive. AGREE increase fluvoxamine  to 37.5 mg daily.  08/06/2019 appointment with the following noted: Increased fluvoxamine  to 37.5 mg and Danielle Bruce helped the depression. Could see a big difference and had been more depressed than she even realized. Outlook better and sleep pattern normalized.    More energy and motivation.   But also started having prolonged HA.  HA lasted a couple of weeks.   Assumed HA was DT increased fluvoxamine  so reduced to 25 mg on 07/28/19.  07/29/19 Episode of  confusion occurred also.  Went to doctor while confused.  They couldn't determine cause.    Resolved in 1 day. FM flare up since fall/winter. To beach with daughter 3 mos minimum to give her shots for IVF treatment. Patient reports panic recently and recent difficulty with anxiety.    Occ flashbacks.   Patient denies difficulty with sleep initiation or maintenance. Denies appetite disturbance.  Patient reports that energy and motivation have been  good.  Patient denies any difficulty with concentration.  Patient denies any suicidal ideation. Plan: AGREE increase fluvoxamine  to 37.5 mg daily once HA is controlled for several weekcs.  01/01/20 appt with the following noted: Increased fluvoxamine  for awhile but got more HA and reduced Danielle Bruce. Anxiety is better in general now.  But is interested in increasing Danielle Bruce DT coming winter. When anxiety is not good Danielle Bruce catches her off guard. Likely had Covid in August but early test was negative.  Got prednisone  but sick for 5 weeks.  Lost taste and smell still going on.  D also had Covid.  Felt really sick and terrified with bad mental health at the time.  Was traumatic for her.  Had been vaccinated.   Had panic attack at the ER and took a long time to get the Ativan .  D moving back to area and pt will have to help with childcare so hopes to more aggressively treat her health problems. 2 gkids and 2 more coming. Danielle Danielle Bruce married without kids yet. Plan: started Luvox  again for Danielle Bruce and for the anti-inflammatory potential for her health.  Has seen some benefit.   Prior benefit at 25 mg daily.  Optiion increase but slightly bc med sensitive. AGREE increase fluvoxamine  to 37.5 mg daily once HA is controlled for several weeks.  03/03/2020 appointment with the following noted: On Day 10 of Covid.  2nd bout. A lot easier this time. Had gotten Luvox  up to 50 mg daily and tolerated.  Then doubled Danielle Bruce to 100 mg daily. Wonders about the proper dose for maintenance.   No taste and smell, exhausted, congestion and mild cough.  Energy is better than Danielle Bruce was in the beginning.  Made her depressed and anxious again and that part is better also.  Last time had to go to ER with Covid.  Only ER visit in 2-3 years.  Less migraine than in the past. Overall was doing better with last couple months.  Trying to do anti-inflammatory diet.  Vegetarian, no dairy or gluten.  Natural sugars.   Still some anxiety. D moved back here and pregnant  with 2nd set of twins. Current twins are 61 yo.   Will restart trauma therapy and Danielle Bruce's helped over the last couple of years.  More freedom from things.      Less daily dread with anxiety but still some panic which can be triggered.  Less OCD with fluvoxamine  unless triggered.   Taking Ritalin  10 mg each AM and tolerating and benefiting. occ NM with few dreams overall. Plan: started Luvox  again for Danielle Bruce and for the anti-inflammatory potential for her health.  Has seen some benefit.   Prior benefit at 25 mg daily.  Med sensitive. AGREE increase fluvoxamine  to  50  mg daily. Up to 100 mg daily if desired.   04/22/19 appt with following noted: Mostly good and randomly psycho.  Really busy with Trevor Fudge [redacted] weeks pregnant with twins and twins 61 yo at home.  She's helping and her H is away. Mo older and slower.  Pt overworking.  When with Trevor Fudge kids interfere with sleep.  Doesn't eat as well with Trevor Fudge affects her FM and energy.   Recently exhausted and needed rest but couldn't go home DT D had to go to hospital.  Her H critical at times and she lost Danielle Bruce emotionally over this. Almost suicidal reaction with weird intrusive thoughts in response to the criticism.   Got a break and Danielle Bruce helped.   Rheum adjusted Lyrica to BID. Had accident fall with one of babies and was bleeding.  She became hysterical.   Upset now that she couldn't control her emotions at the time.  Couldn't get herself under control to deal with the crisis.    Overall doing ok but doesn't handle stressors well and lose Danielle Bruce.   Plan: AGREE with increase fluvoxamine  to  50  mg daily. Up to 100 mg daily if desired.  06/23/2020 appointment with the following noted: Increased fluvoxamine  25 AM and 50 mg PM and tolerated Danielle Bruce.  Been on this dose awhile.  Going to [place] May 6-19 and son taking her.  Working for [employer] and been successful. Also going to [place], [place].  Excited.   Occ overwhelmed. Starting therapy with Danielle Danielle Bruce and looking  forward to Danielle Bruce and dealing with trauma stuff. Nervous about doing the therapy and getting decompensated like what happened last time. Prednisone  hellps mood and body. Not as depressed as in the past.  Sleep is reasonably good.  Appetite is normal.  Anxiety intermittent.  No suicidal thoughts Plan: No med changes  08/19/2020 appointment with the following noted: Not good.  Dog died 2 week ago and was very close to her for 16 years.  She had been a great comfort during losses and health problems.  Dog helped her through periods of SI. Exhausted. Danielle Danielle Bruce in [place] and going in early July. Wonders how long this will last.  Also increased fluvoxamine  and now out and having withdrawal. Disc difference between grief and depression. Danielle Danielle Bruce needs therapist who takes Medicaid. Plan: no med changes  01/10/21 appt noted: Had covid 3rd time. Still on fluvox 50, Ritalin  10 mg daily, Deplin, trazodone  50 mg HS. Helping with gkids and been sick a lot since September. Been doing so so overall.  At times is good. Son-in-law will be getting raise and D can quit work for a year or so. Danielle Danielle Bruce and his wife in [place] and that's good. Parents getting old. Both parents hearing problems and M more forgetful. Parents personality different.  Less therapy lately bc schedules.  Has dealt with a lot of trauma and will resume after new year and feels Danielle Bruce has been helpful.  04/13/21 appt noted: More problems with appetite increase for a couple of years.  Always hungry.  Wonders about changing stimulant for this.  Weight up too much. Needs to lose 15+ # DT chronic pain including hips and knees. More depression.  Seasonally.  Episodic panic and anxiety chronic. Some intermittent conflict with daughter.   Needs Ativan  intermittently. Still working through trauma issues. Continues therapy.   Haven't gotten back into church.  Not wanting to go out much. Plan: Benefit  Ritalin  10 mg AM and noon for cognitive and ADD reasons and  disc risk of palpitations.  Per request ok trial Vyvanse  for binge eating and ADD instead of Ritalin  per PCP suggestion May also help mood and weight loss should help pain. Caution Danielle Bruce could cause anxiety  06/06/2021 appointment with the following noted: Did get a prescription for  Vyvanse  30 mg on 04/28/2021 filled.  Tried that in place of Ritalin . Better energy and focus with Vyvanse  and less binge eating and lost 7-8 #.  Helped lose prednisone  weight from November. Seems to wear off in 5-6 hours.   In afternoon when Danielle Bruce wears off just wants to sit around.  No sig SE. Thinks she is still a little depressed and not enjoying things like she should. Chronic colitis with recent flare.   To [place] in May for a couple of weeks with son and his wife. Plan: Per request ok trial Vyvanse  for binge eating and ADD and increase  to 40 mg daily per PCP suggestion  08/11/2021 appointment with the following noted: Tolerating increase Vyvanse  40 AM.  No SE.   Less crash with Danielle Bruce and longer duration. Lost 14#.  Less craving.   Still kind of depressed.  B moved back after divorce.  F mad about Danielle Bruce.  M health problems. Home more stressful.   Went to [place]. M acting odd and F cognitive problems. Poor sleep at home bc temp is so warm for parents. OC sx a little worse but not severe. Plan: Better with Vyvanse  for binge eating and ADD and increase  to 40 mg daily per PCP suggestion May also help mood and weight loss should help pain. Continue fluvoxamine  50 mg twice daily  11/16/2021 appointment noted: Helping with gkids. Son in Social worker still working out of state.  F not doing well with cognition and falls. 63yo. Handling stressors pretty well.  When irritable then takes a break. Enjoys grandkids 4 of them. D therapist and not working right now other than taking care of kids. Restarted PT which has helped in the past with pain and limitations physically. Starting therapy with Danielle Danielle Bruce end of October. sTill  taking vyvanse  40 and fluvoxamine  50 mg BID. Has missed some Vyvanse  bc some days sleeping late. More productive with Vyvanse  40 mg AM, but still procrastinates. Can read again better and sustain attention better but not as productive as she would like. Still a fair amount of anxiety easily triggered anxiety at times and hopes therapy will help.  Claustrophobic easily with panic triggered. Depression is OK lately.  02/16/22 appt noted: Had asthma episode and had to leave.  She was not in severe distress and was able to verbalize that she could drive herself home.  04/26/22 appt noted: Doing well.  Overall.  If gets up late then skips vyvanse  and then is sleepy.   Back and forth between [facility] and her house where temp is high for mother.  B at her house and always cold.  No HA with Stephanie's.   F 04-10-22 died with dementia.  Declined quickly.  Didn't have to be placed.  Has been ok.  Gkids talk about him.  M doing pretty well and grateful he passed before being placed.   Eating healthy.   Asks whether needs to continue fluvoxamine .   Still on fluvoxamine  50 BID, Vyvanse  40 AM, trazdone 50 MG Hs Mood is good. Counseling with Danielle Danielle Bruce is already helpful. Still some anxiety but getting better recognizing triggers.  Can catastrophixize and working on CBT.  Still episodes out of the blue. Energy good with Vyvanse . Sleep is ok at D's. Plan no med changes:  07/27/2022 appointment noted: Psych meds fluvoxamine  50 mg twice daily, Vyvanse  50 mg every morning, L-methylfolate 15 mg daily, lorazepam  1 mg as needed panic, Lyrica 50 mg twice daily.  Trazodone  50 mg  nightly as needed insomnia. No SE.   Vyvanse  helps focus, binge eating .  Was out 5 days and overate.  More productive with Danielle Bruce.  Better socialization with Danielle Bruce.  Lost wt on Danielle Bruce. Dose seems right . Sick with URI. Otherwise doing well.   D moved back [place] .  Had "breakdown" with conflict with D with thoughts of wanting to escape but  came out of Danielle Bruce.  Therapist Danielle Danielle Bruce helped her.  Less often panic but has them at times.  Therapist helping her to recognize triggers.  Dep is under control.  Not desperate. Will have flareup with FM.  But has been active with gkids. When with kids sleeps well.   Plan no changes  10/25/22 appt noted: Meds as above & flexeril 10 mg HS.  Not often with lorazepam .   Pending cataract surgery in fall. Working in therapy to try to resolve issues that affect her body.  Danielle Bruce is making her more self aware.  Hasn't started EMDR yet, but working on CBT.   F April 21, 2022 died with dementia. Wants to try methylated B6 and B12 to see if Danielle Bruce helps. If rough mental health day then needs Ativan . Doing pool therapy.   Satisfied with Vyvanse  50 AM.  Has to take by 930 AM to keep from having insomnia.  Helps energy and motivation.  Still needs to work on productivity.    Still some migraine.  Migraine's will cause neuro Sx like word finding, visual. OCD sx been worse lately.  Cleanliness concerns in public.  [place] Brisk to go to gym which produces anxiety.  Not always as bad.   Overall better on the whole but still bad days at times even with SI not often.   Still prefers to avoid going out, church, friends.  M's favorite child, B, has moved back home.   Creates some stress. Vyvanse  solved binge eating generally.  01/04/23 appt noted: More energy with B12 shots. In a bit of FM flare up.   In general doing well mood wise.   Not taking ADD med bc weird CP .  Going to card today to be checked out.  Vyvanse  50 is messing with sleep.  Wants to reduce to 40 mg AM Cut back fluvoxamine  to 50 mg without problems.  Maybe energy is better Lonne Roan is good for her.   Usually sleeps with trazodone  50 and melatonin 3 mg .   Had panic at eye doctor over eye concerns.   Plan:  fluvoxamine  50 mg reduced daily,  Reduced Vyvanse  40 mg every morning DT insomnia,  L-methylfolate 15 mg daily, lorazepam  1 mg as needed panic, Lyrica 50 mg  twice daily.   Trazodone  50 mg nightly as needed insomnia.  03/08/23 appt noted: Meds as above Dad died a year ago.  Feels more depressed.  Spent Xmas wit D's family and enjoyed Danielle Bruce.  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  Was busy there.  When got home slept most of 48 hours. Last couple of days took Vyvanse .  But interfering with sleep when not active.   At home no structure and will get more negative thoughts.   When on prednisone  feels better and more productive and better mood.  Son Will transitioned to [place] and Danielle Bruce at home. She is more depressed and that drags her down.  4 people at home.    07/05/23 appt noted: Med: vyvanse  40 or Adderall XR 20, incr fluvox 75 for OCD Never had therapist like Danielle Danielle Bruce.  Really good. Would like diff stimulant bc ins and still some binge eating usually in the evening.   SE Vyvanse  ins Tried 40 and 50 worse ins. Adderall XR   20 doesn't help EDO as much but helps focus without insomnia.   Is alternating time.   Past psych meds: Lithium, carbamazepine, oxcarbazepine, topiramate , valproic acid, lamotrigine , gabapentin   Lyrica ? effect Zyprexa, Seroquel, Latuda headaches, Geodon,  Risperidone, Stelazine,  Rexulti,  Wellbutrin with side effects,  sertraline irritability, duloxetine,  fluvoxamine  100, fluoxetine, Stopped desipramine DT skin issues which now resolved.  Danielle Bruce helped GI px.  buspirone side effects,   N-acetylcysteine,  pramipexole,  Methylphenidate  NR,  Adderall,  Vyvanse  50 SE insomnia helps Ambien with amnesia, trazodone , Ativan   Review of Systems:  Review of Systems  HENT:  Negative for congestion.   Gastrointestinal:  Positive for abdominal pain. Negative for nausea.  Musculoskeletal:  Positive for back pain and myalgias.  Neurological:  Positive for headaches. Negative for tremors.  Psychiatric/Behavioral:  Positive for decreased concentration and dysphoric mood. Negative for behavioral problems. The patient is nervous/anxious.    Less HA with new meds.  Medications: I have reviewed the patient's current medications.  Current Outpatient Medications  Medication Sig Dispense Refill   L-Methylfolate 15 MG TABS TAKE 1 TABLET BY MOUTH DAILY 30 tablet 11   LORazepam  (ATIVAN ) 1 MG tablet TAKE 1 TABLET BY MOUTH EVERY 8 HOURS AS NEEDED FOR ANXIETY( TAKE 2 MG WHEN HAVING A MEDICAL PROCEDURE) 30 tablet 0   NURTEC 75 MG TBDP Take 1 tablet by mouth daily as needed.     Olopatadine  HCl 0.2 % SOLN Apply 1 drop to eye daily as needed (itchy/watery eyes). 2.5 mL 5   oxyCODONE -acetaminophen  (PERCOCET/ROXICET) 5-325 MG per tablet Take 2 tablets by mouth daily as needed for moderate pain or severe pain (mighraine.).      polyethylene glycol (MIRALAX  / GLYCOLAX ) packet Take 17 g by mouth daily as needed for mild constipation.     PROLIA 60 MG/ML SOSY injection BRING TO THE OFFICE FOR INJECTION ON 02/15/2018 AS DIRECTED ONCE EVERY 6 MONTHS     promethazine  (PHENERGAN ) 25 MG tablet Take 25 mg by mouth every 6 (six) hours as needed for nausea.     traZODone  (DESYREL ) 50 MG tablet TAKE 1 TABLET(50 MG) BY MOUTH AT BEDTIME 30 tablet 2   VASCEPA 1 g CAPS TK 2 CS PO BID WC  3   VOLTAREN 1 % GEL Apply 2 g topically 4 (four) times daily as needed (JOINT PAIN).   3   albuterol  (PROVENTIL  HFA;VENTOLIN  HFA) 108 (90 Base) MCG/ACT inhaler Inhale 2 puffs into the lungs every 6 (six) hours as needed for wheezing or shortness of breath.     amphetamine -dextroamphetamine (ADDERALL XR) 20 MG 24 hr capsule Take 1 capsule (20 mg total) by mouth daily. 30 capsule 0   azelastine  (ASTELIN ) 0.1 % nasal spray Place 1-2 sprays into both nostrils 2 (two) times daily as needed (nasal drainage). Use in each nostril as directed 30 mL 3   Calcium  Carb-Cholecalciferol (CALCIUM  1000 + D) 1000-20 MG-MCG TABS Take by mouth.     cetirizine  (ZYRTEC ) 10 MG tablet Take 1 tablet (10 mg total) by mouth daily. 30 tablet 11   cyclobenzaprine (FLEXERIL) 10 MG tablet Take 10 mg by  mouth at bedtime as needed.     diphenhydrAMINE  (BENADRYL ) 25 mg capsule Take 50 mg by mouth every 6 (six) hours as needed (HEADACHES).  EPINEPHrine  (EPIPEN  2-PAK) 0.3 mg/0.3 mL IJ SOAJ injection Inject 0.3 mg into the muscle as needed for anaphylaxis. 0.3 mL 1   Erenumab-aooe 70 MG/ML SOAJ Inject into the skin every 28 (twenty-eight) days.     fluticasone  (FLONASE ) 50 MCG/ACT nasal spray Place 1-2 sprays into both nostrils daily as needed (nasal congestion). 16 g 3   fluvoxaMINE  (LUVOX ) 25 MG tablet Take 3 tablets (75 mg total) by mouth at bedtime. 225 tablet 1   hydrocortisone (ANUSOL-HC) 25 MG suppository 1 suppository     ibuprofen  (ADVIL ) 800 MG tablet 1 tablet with food or milk as needed     rosuvastatin  (CRESTOR ) 10 MG tablet Take 1 tablet (10 mg total) by mouth daily. 90 tablet 3   No current facility-administered medications for this visit.    Medication Side Effects: None  Allergies:  Allergies  Allergen Reactions   Sulfa Antibiotics Nausea And Vomiting    Causes pt to vomit blood Other reaction(s): vomiting blood   Gluten Meal Other (See Comments)    Sensitivity.    Lactose Intolerance (Gi) Nausea And Vomiting   Aspirin     Other reaction(s): colitis   Bromfed Dm [Pseudoeph-Bromphen-Dm]     Other reaction(s): strung out and hallucinating   Chlorpromazine     Other reaction(s): Dark thoughts   Desipramine     Other reaction(s): rash   Gabapentin      Other reaction(s): swelling / fuzzy vision   Hydrocodone      Other reaction(s): causes hangovers   Lamotrigine      Other reaction(s): rash   Metronidazole     Other reaction(s): vomiting blood   Morphine Sulfate     Other reaction(s): tearful   Nsaids     Other reaction(s): colitis   Flagyl [Metronidazole Hcl] Nausea And Vomiting   Morphine And Codeine  Other (See Comments)    Reaction: Depression, emotional    Past Medical History:  Diagnosis Date   Abdominal pain, unspecified site 10/18/2012   Anemia     Anxiety    Arthritis    osteoarthritis   ASCUS (atypical squamous cells of undetermined significance) on Pap smear 05/06/2005   NEG HIGH RISK HPV--C&B BIOPSY BENIGN 12/2005   Asthma    Bipolar 2 disorder (HCC)    Cancer (HCC)    skin cancer - basal cell   Colon polyps    Complication of anesthesia    anxious afterwards, will get headaches    Constipation    Depression    Fibromyalgia 10/2013   GERD (gastroesophageal reflux disease)    Hearing loss on left    Heart murmur    never had any problems   Hemorrhoids    High cholesterol    High risk HPV infection 08/2011   cytology negative   IBS (irritable bowel syndrome)    Insomnia    Lymphocytic colitis    Lymphoma (HCC)    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   Bone marrow biopsy showes 8% plasma cells IgA Lambda   Migraines    Nausea alone 10/18/2012   Osteoarthritis    Osteopenia    Peripheral neuropathy    PTSD (post-traumatic stress disorder)     Family History  Problem Relation Age of Onset   Allergic rhinitis Mother    Allergic rhinitis Father    Diabetes Father    Hypertension Father    Hyperlipidemia Father    Allergic rhinitis Sister    Allergic rhinitis Brother    Allergic rhinitis  Brother    Allergic rhinitis Maternal Aunt    Allergic rhinitis Maternal Uncle    Allergic rhinitis Paternal Aunt    Allergic rhinitis Paternal Uncle    Allergic rhinitis Maternal Grandmother    Allergic rhinitis Maternal Grandfather    Heart disease Maternal Grandfather    Allergic rhinitis Paternal Grandmother    Heart disease Paternal Grandmother    Allergic rhinitis Paternal Grandfather    Heart disease Paternal Grandfather    Allergic rhinitis Daughter    Depression Daughter    Anxiety disorder Daughter    Asthma Daughter    Atopy Son    Eczema Son    Allergic rhinitis Son    Depression Son    Atopy Son    Allergic rhinitis Son    Depression Son    Anxiety disorder Son    Breast  cancer Neg Hx     Social History   Socioeconomic History   Marital status: Divorced    Spouse name: Not on file   Number of children: 3   Years of education: Not on file   Highest education level: Not on file  Occupational History   Not on file  Tobacco Use   Smoking status: Former    Current packs/day: 0.00    Types: Cigarettes    Quit date: 05/13/2014    Years since quitting: 9.1   Smokeless tobacco: Never   Tobacco comments:    social   Vaping Use   Vaping status: Never Used  Substance and Sexual Activity   Alcohol use: Yes    Alcohol/week: 0.0 standard drinks of alcohol    Comment: rare   Drug use: Never   Sexual activity: Not Currently    Comment: -1st intercourse 63 yo-More than 5 partners  Other Topics Concern   Not on file  Social History Narrative   Not on file   Social Drivers of Health   Financial Resource Strain: Not on file  Food Insecurity: Low Risk  (01/17/2023)   Received from [facility]   Hunger Vital Sign    Worried About Running Out of Food in the Last Year: Never true    Ran Out of Food in the Last Year: Never true  Transportation Needs: No Transportation Needs (01/17/2023)   Received from [facility]    In the past 12 months, has lack of reliable transportation kept you from medical appointments, meetings, work or from getting things needed for daily Danielle Bruce? : No  Physical Activity: Not on file  Stress: Not on file  Social Connections: Unknown (05/11/2022)   Received from [facility], [facility]   Social Network    Social Network: Not on file  Intimate Partner Violence: Unknown (05/11/2022)   Received from [facility], [facility]   HITS    Physically Hurt: Not on file    Insult or Talk Down To: Not on file    Threaten Physical Harm: Not on file    Scream or Curse: Not on file    Past Medical History, Surgical history, Social history, and Family history were reviewed and updated as appropriate.   Please  see review of systems for further details on the patient's review from today.   Objective:   Physical Exam:  LMP 12/28/2010   Physical Exam Constitutional:      General: She is not in acute distress. Musculoskeletal:        General: No deformity.  Neurological:     Mental  Status: She is alert and oriented to person, place, and time.     Coordination: Coordination normal.  Psychiatric:        Attention and Perception: Attention and perception normal. She does not perceive auditory or visual hallucinations.        Mood and Affect: Mood is anxious and depressed. Affect is not labile, angry or tearful.        Speech: Speech normal.        Behavior: Behavior normal.        Thought Content: Thought content normal. Thought content is not paranoid or delusional. Thought content does not include homicidal or suicidal ideation. Thought content does not include suicidal plan.        Cognition and Memory: Cognition and memory normal.        Judgment: Judgment normal.     Comments: Insight intact Depression worse again       Lab Review:     Component Value Date/Time   NA 140 01/10/2023 1125   NA 144 07/12/2015 1521   K 4.5 01/10/2023 1125   K 3.8 07/12/2015 1521   CL 103 01/10/2023 1125   CO2 24 01/10/2023 1125   CO2 27 07/12/2015 1521   GLUCOSE 93 01/10/2023 1125   GLUCOSE 98 10/30/2022 1702   GLUCOSE 82 07/12/2015 1521   BUN 16 01/10/2023 1125   BUN 10.3 07/12/2015 1521   CREATININE 0.61 01/10/2023 1125   CREATININE 0.87 08/01/2016 1713   CREATININE 0.8 07/12/2015 1521   CALCIUM  9.4 01/10/2023 1125   CALCIUM  9.9 07/12/2015 1521   PROT 7.4 08/01/2016 1713   PROT 7.4 07/12/2015 1521   ALBUMIN 4.8 08/01/2016 1713   ALBUMIN 4.4 07/12/2015 1521   AST 23 08/01/2016 1713   AST 22 07/12/2015 1521   ALT 26 08/01/2016 1713   ALT 31 07/12/2015 1521   ALKPHOS 55 08/01/2016 1713   ALKPHOS 54 07/12/2015 1521   BILITOT 0.7 08/01/2016 1713   BILITOT 0.40 07/12/2015 1521   GFRNONAA  >60 10/30/2022 1702   GFRNONAA 76 08/01/2016 1713   GFRAA 88 08/01/2016 1713       Component Value Date/Time   WBC 5.6 10/30/2022 1702   RBC 4.65 10/30/2022 1702   HGB 14.2 10/30/2022 1702   HGB 14.1 07/12/2015 1521   HCT 41.3 10/30/2022 1702   HCT 41.5 07/12/2015 1521   PLT 247 10/30/2022 1702   PLT 248 07/12/2015 1521   MCV 88.8 10/30/2022 1702   MCV 92.2 07/12/2015 1521   MCH 30.5 10/30/2022 1702   MCHC 34.4 10/30/2022 1702   RDW 12.0 10/30/2022 1702   RDW 13.3 07/12/2015 1521   LYMPHSABS 2,006 08/01/2016 1713   LYMPHSABS 1.8 07/12/2015 1521   MONOABS 354 08/01/2016 1713   MONOABS 0.7 07/12/2015 1521   EOSABS 118 08/01/2016 1713   EOSABS 0.1 07/12/2015 1521   BASOSABS 0 08/01/2016 1713   BASOSABS 0.0 07/12/2015 1521    No results found for: "POCLITH", "LITHIUM"   No results found for: "PHENYTOIN", "PHENOBARB", "VALPROATE", "CBMZ"   Genesight:  2D6 poor,  SLC6A4 short/short MTHFR homozygous   .res Assessment: Plan:    Hidie "Ludwig Safer" was seen today for follow-up and medication reaction.  Diagnoses and all orders for this visit:  Major depressive disorder, recurrent episode, moderate (HCC) -     fluvoxaMINE  (LUVOX ) 25 MG tablet; Take 3 tablets (75 mg total) by mouth at bedtime.  Attention deficit hyperactivity disorder (ADHD), combined type -     amphetamine -dextroamphetamine (  ADDERALL XR) 20 MG 24 hr capsule; Take 1 capsule (20 mg total) by mouth daily.  PTSD (post-traumatic stress disorder) -     fluvoxaMINE  (LUVOX ) 25 MG tablet; Take 3 tablets (75 mg total) by mouth at bedtime.  Generalized anxiety disorder -     fluvoxaMINE  (LUVOX ) 25 MG tablet; Take 3 tablets (75 mg total) by mouth at bedtime.  Fibromyalgia  Panic disorder with agoraphobia -     fluvoxaMINE  (LUVOX ) 25 MG tablet; Take 3 tablets (75 mg total) by mouth at bedtime.  Insomnia due to mental condition  Mixed obsessional thoughts and acts -     fluvoxaMINE  (LUVOX ) 25 MG tablet; Take 3  tablets (75 mg total) by mouth at bedtime.  Binge-eating disorder, moderate  Low vitamin D  level   Past couple of years more obvious PTSD obvious has called into doubt the bipolar dix bc she's remained mood stable off the lamotrigine .    She has had symptoms consistent with hypomania in the past but there is now some question as to whether they were PTSD related rather than truly bipolar 2.  Her son has had treatment resistant depression with possible bipolar disorder and her daughter has been diagnosed with bipolar disorder but also a history of borderline personality disorder.  So there is a family history of the spectrum.  Better with grief.     Prior benefit fluvoxamine  for anxiety and mood disorders.  No SE Continue  increase fluvoxamine  75 mg daily for dep and OCD  Clear benefit from fluvoxamine  for mood quite significantly. No manic sx. Consider weaning when therapy has helped a little more and further progress.  HA are better 2-4/month.  Still has them at times.    Disc sleep hygiene and temperature.     discussed the short-term risks associated with benzodiazepines including sedation and increased fall risk among others.  Discussed long-term side effect risk including dependence, potential withdrawal symptoms, and the potential eventual dose-related risk of dementia.  But recent studies from 2020 dispute this association between benzodiazepines and dementia risk. Newer studies in 2020 do not support an association with dementia.   Keep this at a minimum given cognitive concerns.  She only uses Danielle Bruce when triggered with anxiety usually medical.  She is not using Danielle Bruce regularly. Continue Ativan  prn 1 mg for panic and triggers.  Able to use much less since therapy  Discussed potential benefits, risks, and side effects of stimulants with patient to include increased heart rate, palpitations, insomnia, increased anxiety, increased irritability, or decreased appetite.  Instructed patient to  contact office if experiencing any significant tolerability issues.  Vyvanse  or Adderall XR 20 mg AM  depending on situation. May also help mood and weight loss should help pain.  Has been helpful.   Disc timing and options.   Caution Danielle Bruce could cause anxiety and other SE  Therapy helping emotional management.   trial methylated  B12.  L-methylfolate 15 mg daily, lorazepam  1 mg as needed panic,  stopped DT wt gain Lyrica 50 mg twice daily.   Trazodone  50 mg nightly as needed insomnia.  FU 2 mos  Danielle Beat, MD, DFAPA    Please see After Visit Summary for patient specific instructions.  Future Appointments  Date Time Provider Department Center  07/10/2023 11:45 AM Marissa Sida, PT [facility] None  07/10/2023  3:00 PM Danielle Bruce, Danielle G, LCSW [facility] None  07/11/2023 10:15 AM Nonie Beady, PTA [facility] None  07/17/2023  3:00 PM Danielle Bruce, Odean Bend  G, LCSW [facility] None  07/24/2023  3:00 PM Danielle Bruce, Danielle G, LCSW [facility] None  07/25/2023  1:00 PM Danielle Bruce, Kennedy Peabody., MD [facility] None  07/31/2023  3:00 PM Danielle Bruce, Tom Found, LCSW [facility] None  08/07/2023  3:00 PM Danielle Bruce, Danielle G, LCSW [facility] None  08/14/2023  3:00 PM Danielle Bruce, Danielle G, LCSW [facility] None  08/21/2023  3:00 PM Danielle Bruce, Danielle G, LCSW [facility] None  08/28/2023  3:00 PM Danielle Bruce, Danielle G, LCSW [facility] None    No orders of the defined types were placed in this encounter.      -------------------------------

## 2023-07-10 ENCOUNTER — Ambulatory Visit

## 2023-07-10 ENCOUNTER — Ambulatory Visit (INDEPENDENT_AMBULATORY_CARE_PROVIDER_SITE_OTHER): Payer: Medicare Other | Admitting: Psychology

## 2023-07-10 DIAGNOSIS — F902 Attention-deficit hyperactivity disorder, combined type: Secondary | ICD-10-CM

## 2023-07-10 DIAGNOSIS — F4310 Post-traumatic stress disorder, unspecified: Secondary | ICD-10-CM | POA: Diagnosis not present

## 2023-07-10 DIAGNOSIS — F422 Mixed obsessional thoughts and acts: Secondary | ICD-10-CM | POA: Diagnosis not present

## 2023-07-10 DIAGNOSIS — F331 Major depressive disorder, recurrent, moderate: Secondary | ICD-10-CM | POA: Diagnosis not present

## 2023-07-10 DIAGNOSIS — F411 Generalized anxiety disorder: Secondary | ICD-10-CM

## 2023-07-10 NOTE — Progress Notes (Signed)
 [facility] Counselor/Therapist Progress Note  Patient ID: Danielle Bruce, MRN: [id number],    Date:07/10/2023  Time Spent: 62 minutes Time In:  3:02 Time out: 4:04  Treatment Type: Individual Therapy  Reported Symptoms: flashbacks, responding to triggers, depression, anxiety, being busy all of the time, dissociation  Mental Status Exam: Appearance:  Casual     Behavior: Appropriate  Motor: normal  Speech/Language:  Clear and Coherent  Affect: Blunt  Mood: anxious  Thought process: normal  Thought content:   WNL  Sensory/Perceptual disturbances:   WNL  Orientation: oriented to person, place, time/date, and situation  Attention: Good  Concentration: Good  Memory: WNL  Fund of knowledge:  Good  Insight:   Good  Judgment:  Fair  Impulse Control: Good   Risk Assessment: Danger to Self:  No Self-injurious Behavior: No Danger to Others: No Duty to Warn:no Physical Aggression / Violence:No  Access to Firearms a concern: No  Gang Involvement:No   Subjective: The patient attended a face-to-face individual therapy via video visit today.  The patient gave verbal consent for the session to be on caregility and she is aware of the limitations of telehealth.  The patient was in her home alone and the therapist was in the office.  The patient was very anxious today.  The patient reports that [place] came into her room this morning and obviously was psychotic.  She talked about having to take [facility] down to the third [employer] and that [facility] was going to get admitted to the hospital somewhere.  I spent a lot of time with the patient educating her on the system and how the system works and talking about what things potentially could happen and how to navigate the system so that she can be supportive of [place] in the best way possible.  The patient reports that historically she would have wanted to go to sleep and not really think about this but she feels  like she is handling it much better than she would have had handled in the past before she started therapy.  The patient is aware that she can contact me if she needs some guidance and we will manage the situation as best we can and I encouraged her to stay as positive as possible and to let the professionals help [facility] as much as possible. Interventions: Cognitive Behavioral Therapy, Mindfulness Meditation, Eye Movement Desensitization and Reprocessing (EMDR), Insight-Oriented, and Interpersonal, psychoeducation  Diagnosis:Major depressive disorder, recurrent episode, moderate (HCC)  Attention deficit hyperactivity disorder (ADHD), combined type  PTSD (post-traumatic stress disorder)  Generalized anxiety disorder  Mixed obsessional thoughts and acts  Plan: Plan of Care: Client Abilities/Strengths  Insightful, motivated, Supportive family Client Treatment Preferences  Outpatient Individual therapy/EMDR  Client Statement of Needs  "I think I have PTSD and I feel like I need another kind of therapy than talk therapy" Treatment Level  Outpatient Individual therapy  Symptoms  Demonstrates an exaggerated startle response.:  (Status: maintained). Depressed  or irritable mood.: (Status:maintained). Describes a reliving of the event,  particularly through dissociative flashbacks.: (Status: maintained). Displays a  significant decline in interest and engagement in activities.:  (Status: maintained). Displays significant psychological and/or physiological distress resulting from internal and external  clues that are reminiscent of the traumatic event.: (Status: maintained).  Experiences disturbances in sleep.:  (Status: maintained). Experiences disturbing  and persistent thoughts, images, and/or perceptions of the traumatic event.:  (Status: maintained). Experiences frequent nightmares.: (Status: maintained).  Feelings of hopelessness, worthlessness,  or inappropriate guilt.: No Description  Entered (Status:  improved). Has been exposed to a traumatic event involving actual or perceived threat of death or  serious injury.:  (Status: maintained). Impairment in social, occupational, or  other areas of functioning.:(Status: maintained). Intentionally avoids activities,  places, people, or objects (e.g., up-armored vehicles) that evoke memories of the event.: (Status: maintained). Intentionally avoids thoughts, feelings, or discussions related  to the traumatic event.: (Status: maintained). Reports difficulty concentrating as  well as feelings of guilt (Status:maintained). Reports response of intense fear,  helplessness, or horror to the traumatic event.:  (Status: maintained).  Problems Addressed  Unipolar Depression, Posttraumatic Stress Disorder (PTSD), Posttraumatic Stress Disorder (PTSD),   Posttraumatic Stress Disorder (PTSD), Posttraumatic Stress Disorder (PTSD)  Goals 1. Develop healthy thinking patterns and beliefs about self, others, and the world that lead to the alleviation and help prevent the relapse of  depression. Objective Identify and replace thoughts and beliefs that support depression. Target Date: 2024/01/04 Frequency: Weekly Progress: 60 Modality: individual Related Interventions 1. Explore and restructure underlying assumptions and beliefs reflected in biased self-talk that  may put the client at risk for relapse or recurrence. 2. Conduct Cognitive-Behavioral Therapy (see Cognitive Behavior Therapy by Kerrie Peek; Overcoming Depression by Duke Gibbons al.), beginning with helping the client learn the connection among  cognition, depressive feelings, and actions. 2. Eliminate or reduce the negative impact trauma related symptoms have  on social, occupational, and family functioning. Objective Learn and implement personal skills to manage challenging situations related to trauma. Target Date: 2024/01/04 Frequency: Weekly Progress: 0 Modality: individual 3. No  longer avoids persons, places, activities, and objects that are  reminiscent of the traumatic event. Objective Participate in Eye Movement Desensitization and Reprocessing (EMDR) to reduce emotional distress  related to traumatic thoughts, feelings, and images. Target Date: 2024/01/04 Frequency: Weekly Progress: 0 Modality: individual   Related Interventions 1. Utilize Eye Movement Desensitization and Reprocessing (EMDR) to reduce the client's  emotional reactivity to the traumatic event and reduce PTSD symptoms. Objective Learn and implement guided self-dialogue to manage thoughts, feelings, and urges brought on by  encounters with trauma-related situations. Target Date: 2025-11/07 Frequency: Weekly Progress: 10 Modality: individual Related Interventions 1. Teach the client a guided self-dialogue procedure in which he/she learns to recognize  maladaptive self-talk, challenges its biases, copes with engendered feelings, overcomes  avoidance, and reinforces his/her accomplishments; review and reinforce progress, problemsolve obstacles. 4. No longer experiences intrusive event recollections, avoidance of event  reminders, intense arousal, or disinterest in activities or  relationships. 5. Thinks about or openly discusses the traumatic event with others  without experiencing psychological or physiological distress. Diagnosis Axis  none F43.10 (Posttraumatic stress disorder) - Open - [Signifier: n/a] Posttraumatic Stress  Disorder  Conditions For Discharge Achievement of treatment goals and objectives   Merle Whitehorn G Cassey Hurrell, LCSW

## 2023-07-11 ENCOUNTER — Ambulatory Visit: Payer: Self-pay | Admitting: Physical Therapy

## 2023-07-11 NOTE — Therapy (Deleted)
 OUTPATIENT PHYSICAL THERAPY TREATMENT     Patient Name: Danielle Bruce MRN: [id number] DOB:1960-08-22, 63 y.o., female Today's Date: 07/11/2023         Past Medical History:  Diagnosis Date   Abdominal pain, unspecified site 10/18/2012   Anemia    Anxiety    Arthritis    osteoarthritis   ASCUS (atypical squamous cells of undetermined significance) on Pap smear 05/06/2005   NEG HIGH RISK HPV--C&B BIOPSY BENIGN 12/2005   Asthma    Bipolar 2 disorder (HCC)    Cancer (HCC)    skin cancer - basal cell   Colon polyps    Complication of anesthesia    anxious afterwards, will get headaches    Constipation    Depression    Fibromyalgia 10/2013   GERD (gastroesophageal reflux disease)    Hearing loss on left    Heart murmur    never had any problems   Hemorrhoids    High cholesterol    High risk HPV infection 08/2011   cytology negative   IBS (irritable bowel syndrome)    Insomnia    Lymphocytic colitis    Lymphoma (HCC)    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   Bone marrow biopsy showes 8% plasma cells IgA Lambda   Migraines    Nausea alone 10/18/2012   Osteoarthritis    Osteopenia    Peripheral neuropathy    PTSD (post-traumatic stress disorder)    Past Surgical History:  Procedure Laterality Date   BONE MARROW BIOPSY Left 12/18/2013   Plasma cell dyscrasia 8% population of plasma cells   BREAST BIOPSY Right    benign stereo   CESAREAN SECTION  40,98,11   CHOLECYSTECTOMY N/A 10/16/2012   Procedure: LAPAROSCOPIC CHOLECYSTECTOMY;  Surgeon: Brandy Cal. Cornett, MD;  Location: [facility];  Service: General;  Laterality: N/A;   COLONOSCOPY     numerous times   DILATION AND CURETTAGE OF UTERUS     ESOPHAGOGASTRODUODENOSCOPY     HEMORRHOID SURGERY  1993   x3   IUD REMOVAL  02/2015   Mirena   LAPAROSCOPIC LYSIS OF ADHESIONS N/A 10/16/2012   Procedure: LAPAROSCOPIC LYSIS OF ADHESIONS;  Surgeon: Andy Bannister A. Cornett, MD;  Location: [facility];  Service:  General;  Laterality: N/A;   LAPAROSCOPY N/A 10/16/2012   Procedure: LAPAROSCOPY DIAGNOSTIC;  Surgeon: Brandy Cal. Cornett, MD;  Location: [facility];  Service: General;  Laterality: N/A;   PELVIC LAPAROSCOPY     RADIOLOGY WITH ANESTHESIA N/A 12/16/2015   Procedure: MRI OF BRAIN WITH AND WITHOUT CONTRAST;  Surgeon: Medication Radiologist, MD;  Location: [facility];  Service: Radiology;  Laterality: N/A;   SHOULDER SURGERY  2007/2008   SPINE SURGERY  2010   cervical   Patient Active Problem List   Diagnosis Date Noted   GAD (generalized anxiety disorder) 12/26/2017   OCD (obsessive compulsive disorder) 12/26/2017   PTSD (post-traumatic stress disorder) 12/26/2017   DDD (degenerative disc disease), cervical 07/14/2016   Primary osteoarthritis of both feet 07/14/2016   Primary osteoarthritis of both hands 07/14/2016   Other fatigue 07/14/2016   History of IBS 07/14/2016   Osteopenia of multiple sites 07/14/2016   Fibromyalgia 01/13/2016   MGUS (monoclonal gammopathy of unknown significance) 12/23/2013   Chronic cholecystitis without calculus 10/18/2012   Abdominal pain 10/18/2012   Nausea alone 10/18/2012   Constipation 10/18/2012   Depression 10/18/2012   Anxiety    ASCUS (atypical squamous cells of undetermined significance) on Pap smear  IUD    Hemorrhoids 11/29/2010   Abdominal pain, left upper quadrant 11/29/2010    PCP:  Glena Landau, MD  REFERRING PROVIDER: Glena Landau, MD  REFERRING DIAG: fibromyalgia, TMJ dysfunction (added 04/20/22)  THERAPY DIAG:  Abnormal posture  Cramp and spasm  Muscle weakness (generalized)  Polymyalgia rheumatica (HCC)  Rationale for Evaluation and Treatment Rehabilitation  ONSET DATE: chronic pain (fibromyalgia) with flare-up 2 months ago  SUBJECTIVE:                                                                                                                                                                                                          SUBJECTIVE STATEMENT:     PERTINENT HISTORY:  Anxiety, bipolar disorder, depression, fibromyalgia, IBS, migraines, osteopenia, PTSD  PAIN: 06/19/23 Are you having pain: yes Pain location: Lt hips/knees (3-4/10),   Pain rating: see above Pain description: irritating, annoying   Aggravating factors: doing too much Relieving factors: muscle relaxers, rest, stretching  PRECAUTIONS: Other: chronic pain syndrome and depression Other: slow progression with exercise due to chronic condition  WEIGHT BEARING RESTRICTIONS No  FALLS:  Has patient fallen in last 6 months? No  LIVING ENVIRONMENT: Lives with: lives with their family Lives in: House/apartment  OCCUPATION: on disability  PLOF: Independent with basic ADLs and Leisure: yardwork, walking  Pt cares for her 4 young grandchildren- difficulty with care including lifting and carrying  PATIENT GOALS be more active with less pain, exercise at the gym regularly, lift and carry grandchildren and laundry, sleep with fewer interruptions, get back to more gardening.  OBJECTIVE:   DIAGNOSTIC FINDINGS: none recent   PATIENT SURVEYS:  The Patient-Specific Functional Scale  Initial:  I am going to ask you to identify up to 3 important activities that you are unable to do or are having difficulty with as a result of this problem.  Today are there any activities that you are unable to do or having difficulty with because of this?  (Patient shown scale and patient rated each activity)  Follow up: When you first came in you had difficulty performing these activities.  Today do you still have difficulty?  Patient-Specific activity scoring scheme (Point to one number):  0 1 2 3 4 5 6 7 8 9  10 Unable  Able to perform To perform                                                                                                     activity at the same Activity         Level as before                                                                                                                       Injury or problem  Activity   Lifting laundry and groceries                           follow up:  2 (01/15/23)  follow up: 3 (03/08/23)   follow up: 5/10 (3/325)  2.    Yard work- up and down with squatting           follow up: 7/10 (01/15/23)  follow-up: 7/10 (03/08/23)  follow-up: 7/10 no change due to winter weather (04/30/23)     3.    Drive long distances                                          follow up: 4/10 (01/15/23)    follow-up: not doing this    (03/08/23)    follow-up: not doing this, short distance driving is 1/61, long distance 4/10 (04/30/23)    COGNITION: Overall cognitive status: Within functional limits for tasks assessed  SENSATION: WFL  POSTURE: rounded shoulders and forward head  PALPATION: Diffuse palpable tenderness over bil neck, upper traps, thoracic, lumbar and gluteals with trigger points.    CERVICAL ROM:   Active ROM A/ROM 04/20/22 A/ROM 08/07/22  Flexion  60  Extension    Right lateral flexion 40 41  Left lateral flexion 45 50  Right rotation 65 70  Left rotation 65 75   (Blank rows = not tested) TMJ: opening limited by 25%, clicking with opening on the Rt.  Palpable tenderness over Rt masseter and at TMJ UPPER EXTREMITY ROM: UE A/ROM is limited by 20% into flexion and abduction with pain at end range.  Hip flexibility is limited by 25% in all directions with pain in all directions.  UPPER EXTREMITY MMT: UE: 4+/5, LE 4+/5 except hip flexors 4-/5  TODAY'S TREATMENT:   07/11/23:Pt arrives for aquatic physical therapy. Treatment took place in 3.5-5.5 feet of water. Water temperature was 91 degrees F. Pt entered the pool via  stairs independently. Seated water bench with 75% submersion Pt performed seated LE AROM exercises 20x in all planes, concurrent discussion of current status  including her leg symptoms.. Pt requires buoyancy of water for support and to offload joints with strengthening exercises.   75% depth water walking with UE push/pull 10x in each direction. Hip 3 ways Bil 15x. High knee march pressing single bouy hand floats by her side. 2 lengths x2. Underwater bicycle on horseback 5 min. Soft tissue with large jet to lateral glutes 3 min.    07/04/23:Pt arrives for aquatic physical therapy. Treatment took place in 3.5-5.5 feet of water. Water temperature was 91 degrees F. Pt entered the pool via stairs independently. Seated water bench with 75% submersion Pt performed seated LE AROM exercises 20x in all planes, concurrent discussion of current status including her leg symptoms.. Pt requires buoyancy of water for support and to offload joints with strengthening exercises.   75% depth water walking with UE push/pull 10x in each direction. Hip 3 ways Bil 15x. High knee march pressing single bouy hand floats by her side. 2 lengths x2. Underwater bicycle on horseback 5 min. Soft tissue with large jet to lateral glutes 3 min.   HOME EXERCISE PROGRAM: Access Code: [email or web address] Access Code: [email or web address] URL: [email or web address] Date: 09/06/2022 Prepared by: Loetta Ringer                - Standing Hip Abduction with Counter Support  - 1 x daily - 7 x weekly - 1-2 sets - 10 reps - Standing Hip Extension with Chair  - 1 x daily - 7 x weekly - 1-2 sets - 10 reps  ASSESSMENT:  CLINICAL IMPRESSION   OBJECTIVE IMPAIRMENTS decreased activity tolerance, decreased endurance, difficulty walking, decreased strength, increased muscle spasms, impaired flexibility, improper body mechanics, postural dysfunction, and pain.   ACTIVITY LIMITATIONS carrying, lifting, sitting, standing, reach over head, hygiene/grooming, and locomotion level  PARTICIPATION LIMITATIONS: meal prep, cleaning, laundry, driving, community activity, and yard work  PERSONAL FACTORS Past/current experiences,  Time since onset of injury/illness/exacerbation, and 3+ comorbidities: fibromyalgia depression, anxiety,  are also affecting patient's functional outcome.   REHAB POTENTIAL: Good  CLINICAL DECISION MAKING: Evolving/moderate complexity  EVALUATION COMPLEXITY: Moderate   GOALS: Goals reviewed with patient? Yes  SHORT TERM GOALS: Target date: 12/22/22  Return to regular performance of HEP 4-5x/week to improve mobility  Baseline: met since last visit (12/06/22) Goal status: In progress  2.  Rate lifting (groceries and laundry) > or = to 3/10 on patient specific functional scale (PSFS) Baseline: 2/10 Goal status: NEW  3.  Initiate walking program for exercise > or = to 2x/wk Baseline: not walking  Goal status: NEW    LONG TERM GOALS: Target date: 07/27/23  Be independent in advanced HEP Baseline: exercising when pain and mental health allows, PT assessing every 3 weeks (04/30/23) Goal status: In progress   2.  Rate squatting for yard work > or = to 8/10 on PSFS Baseline: 7/10 (04/30/23) Goal status: Revised   3.  Rate driving > or = to 9/14 on PSFS Baseline: hasn't been driving, taking train for long trips (03/08/23) Goal status: in progress    4.  Walk for exercise and perform regular exercises 3-5x/wk for exercise to improve endurance  Baseline: active with her grandchildren, walks as weather arrives (04/30/23) Goal status: In progress    5.  Rate lifting (groceries and laundry) > or = to 6/10 on PSFS  Baseline: 5/10  (04/30/23) Goal status:  revised     PLAN: PT FREQUENCY: every other week  PT DURATION: 8 weeks  PLANNED INTERVENTIONS: Therapeutic exercises, Therapeutic activity, Neuromuscular re-education, Balance training, Gait training, Patient/Family education, Self Care, Joint mobilization, Aquatic Therapy, Dry Needling, Spinal manipulation, Spinal mobilization, Cryotherapy, Moist heat, Taping, Traction, Manual therapy, and Re-evaluation  PLAN FOR NEXT SESSION: DC  pool  Bethanne Brooks, PTA 07/11/23 7:55 AM

## 2023-07-17 ENCOUNTER — Ambulatory Visit (INDEPENDENT_AMBULATORY_CARE_PROVIDER_SITE_OTHER): Payer: Medicare Other | Admitting: Psychology

## 2023-07-17 DIAGNOSIS — F4310 Post-traumatic stress disorder, unspecified: Secondary | ICD-10-CM

## 2023-07-17 DIAGNOSIS — F902 Attention-deficit hyperactivity disorder, combined type: Secondary | ICD-10-CM

## 2023-07-17 DIAGNOSIS — F422 Mixed obsessional thoughts and acts: Secondary | ICD-10-CM

## 2023-07-19 NOTE — Progress Notes (Signed)
 [facility] Counselor/Therapist Progress Note  Patient ID: Danielle Bruce, MRN: [id number],    Date:07/17/2023  Time Spent: 60 minutes Time In:  3:00 Time out: 4:00  Treatment Type: Individual Therapy  Reported Symptoms: flashbacks, responding to triggers, depression, anxiety, being busy all of the time, dissociation  Mental Status Exam: Appearance:  Casual     Behavior: Appropriate  Motor: normal  Speech/Language:  Clear and Coherent  Affect: Blunt  Mood: anxious  Thought process: normal  Thought content:   WNL  Sensory/Perceptual disturbances:   WNL  Orientation: oriented to person, place, time/date, and situation  Attention: Good  Concentration: Good  Memory: WNL  Fund of knowledge:  Good  Insight:   Good  Judgment:  Fair  Impulse Control: Good   Risk Assessment: Danger to Self:  No Self-injurious Behavior: No Danger to Others: No Duty to Warn:no Physical Aggression / Violence:No  Access to Firearms a concern: No  Gang Involvement:No   Subjective: The patient attended a face-to-face individual therapy in the office today.  The patient reports that her transgendered daughter is still in the hospital and being treated for psychosis.  She ended up going to [facility].  The patient reports that she is still concerned about her stability but she is sounding better.  The patient also talked about a situation that happened over the weekend and apparently her daughter and son-in-law and their 4 children came to visit and her daughter seemed to have somewhat of a temper tantrum because the patient would not take care of her children at the moment that she wanted her to take care of them.  We talked about the dynamics that seem to be going on and the patient realizes that she is getting healthier and is setting better limits with people and that is part of the issue.  We talked about this being healthy and that it is important for her to continue to do that for herself  and that her daughter may have to adjust and adapt to her doing that now.  Interventions: Cognitive Behavioral Therapy, Mindfulness Meditation, Eye Movement Desensitization and Reprocessing (EMDR), Insight-Oriented, and Interpersonal, psychoeducation  Diagnosis:PTSD (post-traumatic stress disorder)  Mixed obsessional thoughts and acts  Attention deficit hyperactivity disorder (ADHD), combined type  Plan: Plan of Care: Client Abilities/Strengths  Insightful, motivated, Supportive family Client Treatment Preferences  Outpatient Individual therapy/EMDR  Client Statement of Needs  "I think I have PTSD and I feel like I need another kind of therapy than talk therapy" Treatment Level  Outpatient Individual therapy  Symptoms  Demonstrates an exaggerated startle response.:  (Status: maintained). Depressed  or irritable mood.: (Status:maintained). Describes a reliving of the event,  particularly through dissociative flashbacks.: (Status: maintained). Displays a  significant decline in interest and engagement in activities.:  (Status: maintained). Displays significant psychological and/or physiological distress resulting from internal and external  clues that are reminiscent of the traumatic event.: (Status: maintained).  Experiences disturbances in sleep.:  (Status: maintained). Experiences disturbing  and persistent thoughts, images, and/or perceptions of the traumatic event.:  (Status: maintained). Experiences frequent nightmares.: (Status: maintained).  Feelings of hopelessness, worthlessness, or inappropriate guilt.: No Description Entered (Status:  improved). Has been exposed to a traumatic event involving actual or perceived threat of death or  serious injury.:  (Status: maintained). Impairment in social, occupational, or  other areas of functioning.:(Status: maintained). Intentionally avoids activities,  places, people, or objects (e.g., up-armored vehicles) that evoke memories of the  event.: (Status: maintained). Intentionally  avoids thoughts, feelings, or discussions related  to the traumatic event.: (Status: maintained). Reports difficulty concentrating as  well as feelings of guilt (Status:maintained). Reports response of intense fear,  helplessness, or horror to the traumatic event.:  (Status: maintained).  Problems Addressed  Unipolar Depression, Posttraumatic Stress Disorder (PTSD), Posttraumatic Stress Disorder (PTSD),   Posttraumatic Stress Disorder (PTSD), Posttraumatic Stress Disorder (PTSD)  Goals 1. Develop healthy thinking patterns and beliefs about self, others, and the world that lead to the alleviation and help prevent the relapse of  depression. Objective Identify and replace thoughts and beliefs that support depression. Target Date: 2024/01/04 Frequency: Weekly Progress: 60 Modality: individual Related Interventions 1. Explore and restructure underlying assumptions and beliefs reflected in biased self-talk that  may put the client at risk for relapse or recurrence. 2. Conduct Cognitive-Behavioral Therapy (see Cognitive Behavior Therapy by Kerrie Peek; Overcoming Depression by Duke Gibbons al.), beginning with helping the client learn the connection among  cognition, depressive feelings, and actions. 2. Eliminate or reduce the negative impact trauma related symptoms have  on social, occupational, and family functioning. Objective Learn and implement personal skills to manage challenging situations related to trauma. Target Date: 2024/01/04 Frequency: Weekly Progress: 0 Modality: individual 3. No longer avoids persons, places, activities, and objects that are  reminiscent of the traumatic event. Objective Participate in Eye Movement Desensitization and Reprocessing (EMDR) to reduce emotional distress  related to traumatic thoughts, feelings, and images. Target Date: 2024/01/04 Frequency: Weekly Progress: 0 Modality: individual   Related  Interventions 1. Utilize Eye Movement Desensitization and Reprocessing (EMDR) to reduce the client's  emotional reactivity to the traumatic event and reduce PTSD symptoms. Objective Learn and implement guided self-dialogue to manage thoughts, feelings, and urges brought on by  encounters with trauma-related situations. Target Date: 2025-11/07 Frequency: Weekly Progress: 10 Modality: individual Related Interventions 1. Teach the client a guided self-dialogue procedure in which he/she learns to recognize  maladaptive self-talk, challenges its biases, copes with engendered feelings, overcomes  avoidance, and reinforces his/her accomplishments; review and reinforce progress, problemsolve obstacles. 4. No longer experiences intrusive event recollections, avoidance of event  reminders, intense arousal, or disinterest in activities or  relationships. 5. Thinks about or openly discusses the traumatic event with others  without experiencing psychological or physiological distress. Diagnosis Axis  none F43.10 (Posttraumatic stress disorder) - Open - [Signifier: n/a] Posttraumatic Stress  Disorder  Conditions For Discharge Achievement of treatment goals and objectives   Oather Muilenburg G Egan Berkheimer, LCSW

## 2023-07-24 ENCOUNTER — Ambulatory Visit: Payer: Medicare Other | Admitting: Psychology

## 2023-07-24 DIAGNOSIS — S70262A Insect bite (nonvenomous), left hip, initial encounter: Secondary | ICD-10-CM | POA: Diagnosis not present

## 2023-07-24 DIAGNOSIS — W57XXXA Bitten or stung by nonvenomous insect and other nonvenomous arthropods, initial encounter: Secondary | ICD-10-CM | POA: Diagnosis not present

## 2023-07-24 DIAGNOSIS — S0093XA Contusion of unspecified part of head, initial encounter: Secondary | ICD-10-CM | POA: Diagnosis not present

## 2023-07-24 DIAGNOSIS — F418 Other specified anxiety disorders: Secondary | ICD-10-CM | POA: Diagnosis not present

## 2023-07-25 ENCOUNTER — Ambulatory Visit: Admitting: Psychiatry

## 2023-07-26 ENCOUNTER — Ambulatory Visit: Admitting: Psychology

## 2023-07-31 ENCOUNTER — Ambulatory Visit (INDEPENDENT_AMBULATORY_CARE_PROVIDER_SITE_OTHER): Admitting: Psychology

## 2023-07-31 DIAGNOSIS — F902 Attention-deficit hyperactivity disorder, combined type: Secondary | ICD-10-CM

## 2023-07-31 DIAGNOSIS — F4310 Post-traumatic stress disorder, unspecified: Secondary | ICD-10-CM

## 2023-07-31 DIAGNOSIS — F422 Mixed obsessional thoughts and acts: Secondary | ICD-10-CM | POA: Diagnosis not present

## 2023-07-31 NOTE — Progress Notes (Unsigned)
                edge,  experiencing concentration difficulties, having trouble falling or staying asleep, exhibiting a general  state of irritability).: No Description Entered (Status: improved). Motor tension (e.g., restlessness,  tiredness, shakiness, muscle tension).: No Description Entered (Status: improved).  Problems Addressed  Anxiety, Phase Of Life Problems, Anxiety  Goals 1. Learn and implement coping skills that result in a reduction of anxiety  and worry, and improved daily functioning. Objective Learn  and implement calming skills to reduce overall anxiety and manage anxiety symptoms. Target Date: 2025-08-09Frequency: Weekly Progress: 40 Modality: individual  Related Interventions 1. Teach the client calming/relaxation skills (e.g., applied relaxation, progressive muscle  relaxation, cue controlled relaxation; mindful breathing; biofeedback) and how to discriminate  better between relaxation and tension; teach the client how to apply these skills to his/her daily  life (e.g., New Directions in Progressive Muscle Relaxation by Marcelyn Ditty, and  Hazlett-Stevens; Treating Generalized Anxiety Disorder by Rygh and Ida Rogue). Objective Identify, challenge, and replace biased, fearful self-talk with positive, realistic, and empowering selftalk. Target Date: 2023-10-06 Frequency: weekly Progress: 30 Modality: individual Related Interventions 1. Explore the client's schema and self-talk that mediate his/her fear response; assist him/her in  challenging the biases; replace the distorted messages with reality-based alternatives and  positive, realistic self-talk that will increase his/her self-confidence in coping with irrational  fears (see Cognitive Therapy of Anxiety Disorders by Laurence Slate). Objective Learn and implement problem-solving strategies for realistically addressing worries. Target Date: 2025-08-09Frequency: weekly Progress: 40 Modality: individual 2. Resolve conflicted feelings and adapt to the new life circumstances. Objective Apply problem-solving skills to current circumstances. Target Date: 2023-10-06 Frequency: weekly Progress: 20 Modality: individual Related Interventions 1. Teach the client problem-resolution skills (e.g., defining the problem clearly, brainstorming  multiple solutions, listing the pros and cons of each solution, seeking input from others,  selecting and implementing a plan of action, evaluating outcome, and readjusting plan as   necessary).   3. Stabilize anxiety level while increasing ability to function on a daily  basis. Diagnosis F33.1  Major depressive disorder, moderate 300.02 (Generalized anxiety disorder) - Open - [Signifier: n/a]  Axis  none 309.28 (Adjustment disorder with mixed anxiety and depressed  mood) - Open - [Signifier: n/a]  Adjustment Disorder,  With Anxiety   Marital conflict  Major Depressive disorder, moderate  Conditions For Discharge Achievement of treatment goals and objectives.  The patient approved this plan.   Deonna Krummel G Ethridge Sollenberger, LCSW

## 2023-08-07 ENCOUNTER — Ambulatory Visit (INDEPENDENT_AMBULATORY_CARE_PROVIDER_SITE_OTHER): Admitting: Psychology

## 2023-08-07 DIAGNOSIS — F422 Mixed obsessional thoughts and acts: Secondary | ICD-10-CM

## 2023-08-07 DIAGNOSIS — F902 Attention-deficit hyperactivity disorder, combined type: Secondary | ICD-10-CM

## 2023-08-07 DIAGNOSIS — F4310 Post-traumatic stress disorder, unspecified: Secondary | ICD-10-CM | POA: Diagnosis not present

## 2023-08-07 NOTE — Progress Notes (Signed)
 Danielle Bruce Counselor/Therapist Progress Note  Patient ID: Danielle Bruce, MRN: [id number],    Date:08/07/2023  Time Spent: 60 minutes Time In:  3:00 Time out: 4:00  Treatment Type: Individual Therapy  Reported Symptoms: flashbacks, responding to triggers, depression, anxiety, being busy all of the time, dissociation  Mental Status Exam: Appearance:  Casual     Behavior: Appropriate  Motor: normal  Speech/Language:  Clear and Coherent  Affect: Blunt  Mood: pleasant  Thought process: normal  Thought content:   WNL  Sensory/Perceptual disturbances:   WNL  Orientation: oriented to person, place, time/date, and situation  Attention: Good  Concentration: Good  Memory: WNL  Fund of knowledge:  Good  Insight:   Good  Judgment:  Fair  Impulse Control: Good   Risk Assessment: Danger to Self:  No Self-injurious Behavior: No Danger to Others: No Duty to Warn:no Physical Aggression / Violence:No  Access to Firearms a concern: No  Gang Involvement:No   Subjective: The patient attended a face-to-face individual therapy in the office today.  The patient presents as pleasant and cooperative.  The patient reports that she just got back from her niece's elementary school graduation.  The patient reports that [facility] got out of the hospital and has had some contact but limited.  The patient seems to be handling it very well.  She also seems to be regulating her mood very well and doing so much better than she has done in the past.  Today we did a neural network map and we will begin identifying what it is what we will work on it with EMDR. Interventions: Cognitive Behavioral Therapy, Mindfulness Meditation, Eye Movement Desensitization and Reprocessing (EMDR), Insight-Oriented, and Interpersonal, psychoeducation  Diagnosis:PTSD (post-traumatic stress disorder)  Mixed obsessional thoughts and acts  Attention deficit hyperactivity disorder (ADHD), combined type  Plan: Plan of  Care: Client Abilities/Strengths  Insightful, motivated, Supportive family Client Treatment Preferences  Outpatient Individual therapy/EMDR  Client Statement of Needs  "I think I have PTSD and I feel like I need another kind of therapy than talk therapy" Treatment Level  Outpatient Individual therapy  Symptoms  Demonstrates an exaggerated startle response.:  (Status: maintained). Depressed  or irritable mood.: (Status:maintained). Describes a reliving of the event,  particularly through dissociative flashbacks.: (Status: maintained). Displays a  significant decline in interest and engagement in activities.:  (Status: maintained). Displays significant psychological and/or physiological distress resulting from internal and external  clues that are reminiscent of the traumatic event.: (Status: maintained).  Experiences disturbances in sleep.:  (Status: maintained). Experiences disturbing  and persistent thoughts, images, and/or perceptions of the traumatic event.:  (Status: maintained). Experiences frequent nightmares.: (Status: maintained).  Feelings of hopelessness, worthlessness, or inappropriate guilt.: No Description Entered (Status:  improved). Has been exposed to a traumatic event involving actual or perceived threat of death or  serious injury.:  (Status: maintained). Impairment in social, occupational, or  other areas of functioning.:(Status: maintained). Intentionally avoids activities,  places, people, or objects (e.g., up-armored vehicles) that evoke memories of the event.: (Status: maintained). Intentionally avoids thoughts, feelings, or discussions related  to the traumatic event.: (Status: maintained). Reports difficulty concentrating as  well as feelings of guilt (Status:maintained). Reports response of intense fear,  helplessness, or horror to the traumatic event.:  (Status: maintained).  Problems Addressed  Unipolar Depression, Posttraumatic Stress Disorder (PTSD),  Posttraumatic Stress Disorder (PTSD),   Posttraumatic Stress Disorder (PTSD), Posttraumatic Stress Disorder (PTSD)  Goals 1. Develop healthy thinking patterns and beliefs about self, others,  and the world that lead to the alleviation and help prevent the relapse of  depression. Objective Identify and replace thoughts and beliefs that support depression. Target Date: 2024/01/04 Frequency: Weekly Progress: 60 Modality: individual Related Interventions 1. Explore and restructure underlying assumptions and beliefs reflected in biased self-talk that  may put the client at risk for relapse or recurrence. 2. Conduct Cognitive-Behavioral Therapy (see Cognitive Behavior Therapy by Kerrie Peek; Overcoming Depression by Duke Gibbons al.), beginning with helping the client learn the connection among  cognition, depressive feelings, and actions. 2. Eliminate or reduce the negative impact trauma related symptoms have  on social, occupational, and family functioning. Objective Learn and implement personal skills to manage challenging situations related to trauma. Target Date: 2024/01/04 Frequency: Weekly Progress: 0 Modality: individual 3. No longer avoids persons, places, activities, and objects that are  reminiscent of the traumatic event. Objective Participate in Eye Movement Desensitization and Reprocessing (EMDR) to reduce emotional distress  related to traumatic thoughts, feelings, and images. Target Date: 2024/01/04 Frequency: Weekly Progress: 0 Modality: individual   Related Interventions 1. Utilize Eye Movement Desensitization and Reprocessing (EMDR) to reduce the client's  emotional reactivity to the traumatic event and reduce PTSD symptoms. Objective Learn and implement guided self-dialogue to manage thoughts, feelings, and urges brought on by  encounters with trauma-related situations. Target Date: 2025-11/07 Frequency: Weekly Progress: 10 Modality: individual Related Interventions 1.  Teach the client a guided self-dialogue procedure in which he/she learns to recognize  maladaptive self-talk, challenges its biases, copes with engendered feelings, overcomes  avoidance, and reinforces his/her accomplishments; review and reinforce progress, problemsolve obstacles. 4. No longer experiences intrusive event recollections, avoidance of event  reminders, intense arousal, or disinterest in activities or  relationships. 5. Thinks about or openly discusses the traumatic event with others  without experiencing psychological or physiological distress. Diagnosis Axis  none F43.10 (Posttraumatic stress disorder) - Open - [Signifier: n/a] Posttraumatic Stress  Disorder  Conditions For Discharge Achievement of treatment goals and objectives   Tuesday Terlecki G Carel Carrier, LCSW

## 2023-08-14 ENCOUNTER — Ambulatory Visit (INDEPENDENT_AMBULATORY_CARE_PROVIDER_SITE_OTHER): Admitting: Psychology

## 2023-08-14 DIAGNOSIS — F4310 Post-traumatic stress disorder, unspecified: Secondary | ICD-10-CM | POA: Diagnosis not present

## 2023-08-14 DIAGNOSIS — F902 Attention-deficit hyperactivity disorder, combined type: Secondary | ICD-10-CM

## 2023-08-14 DIAGNOSIS — F422 Mixed obsessional thoughts and acts: Secondary | ICD-10-CM

## 2023-08-14 NOTE — Progress Notes (Signed)
 [facility] Counselor/Therapist Progress Note  Patient ID: Danielle Bruce, MRN: [id number],    Date:08/14/2023  Time Spent: 60 minutes Time In:  3:10 Time out: 4:10  Treatment Type: Individual Therapy  Reported Symptoms: flashbacks, responding to triggers, depression, anxiety, being busy all of the time, dissociation  Mental Status Exam: Appearance:  Casual     Behavior: Appropriate  Motor: normal  Speech/Language:  Clear and Coherent  Affect: Blunt  Mood: anxious  Thought process: normal  Thought content:   WNL  Sensory/Perceptual disturbances:   WNL  Orientation: oriented to person, place, time/date, and situation  Attention: Good  Concentration: Good  Memory: WNL  Fund of knowledge:  Good  Insight:   Good  Judgment:  Fair  Impulse Control: Good   Risk Assessment: Danger to Self:  No Self-injurious Behavior: No Danger to Others: No Duty to Warn:no Physical Aggression / Violence:No  Access to Firearms a concern: No  Gang Involvement:No   Subjective: The patient attended a face-to-face individual therapy in the office today.  The patient presents as a little anxious today.  The patient reports that she had a conversation with her mother about her birth story and learned a lot of family information from that.  This information seemed to be very therapeutic for the patient.  We talked about what she had learned and talked about having a better understanding of why she responds the way she responds based on family dynamics.  The patient had some questions about why it was that she struggled with coping for all these years and I do believe that it is probably that her disposition was that she was more tender in lots of ways but also that she was not talked how to cope with things and felt insecure about her coping strategies.  We will continue to work on identifying the targets that we are going to use with the EMDR.  Interventions: Cognitive Behavioral Therapy,  Mindfulness Meditation, Eye Movement Desensitization and Reprocessing (EMDR), Insight-Oriented, and Interpersonal, psychoeducation  Diagnosis:PTSD (post-traumatic stress disorder)  Mixed obsessional thoughts and acts  Attention deficit hyperactivity disorder (ADHD), combined type  Plan: Plan of Care: Client Abilities/Strengths  Insightful, motivated, Supportive family Client Treatment Preferences  Outpatient Individual therapy/EMDR  Client Statement of Needs  I think I have PTSD and I feel like I need another kind of therapy than talk therapy Treatment Level  Outpatient Individual therapy  Symptoms  Demonstrates an exaggerated startle response.:  (Status: maintained). Depressed  or irritable mood.: (Status:maintained). Describes a reliving of the event,  particularly through dissociative flashbacks.: (Status: maintained). Displays a  significant decline in interest and engagement in activities.:  (Status: maintained). Displays significant psychological and/or physiological distress resulting from internal and external  clues that are reminiscent of the traumatic event.: (Status: maintained).  Experiences disturbances in sleep.:  (Status: maintained). Experiences disturbing  and persistent thoughts, images, and/or perceptions of the traumatic event.:  (Status: maintained). Experiences frequent nightmares.: (Status: maintained).  Feelings of hopelessness, worthlessness, or inappropriate guilt.: No Description Entered (Status:  improved). Has been exposed to a traumatic event involving actual or perceived threat of death or  serious injury.:  (Status: maintained). Impairment in social, occupational, or  other areas of functioning.:(Status: maintained). Intentionally avoids activities,  places, people, or objects (e.Bruce., up-armored vehicles) that evoke memories of the event.: (Status: maintained). Intentionally avoids thoughts, feelings, or discussions related  to the traumatic event.:  (Status: maintained). Reports difficulty concentrating as  well as feelings of guilt (  Status:maintained). Reports response of intense fear,  helplessness, or horror to the traumatic event.:  (Status: maintained).  Problems Addressed  Unipolar Depression, Posttraumatic Stress Disorder (PTSD), Posttraumatic Stress Disorder (PTSD),   Posttraumatic Stress Disorder (PTSD), Posttraumatic Stress Disorder (PTSD)  Goals 1. Develop healthy thinking patterns and beliefs about self, others, and the world that lead to the alleviation and help prevent the relapse of  depression. Objective Identify and replace thoughts and beliefs that support depression. Target Date: 2024/01/04 Frequency: Weekly Progress: 60 Modality: individual Related Interventions 1. Explore and restructure underlying assumptions and beliefs reflected in biased self-talk that  may put the client at risk for relapse or recurrence. 2. Conduct Cognitive-Behavioral Therapy (see Cognitive Behavior Therapy by Kerrie Peek; Overcoming Depression by Duke Gibbons al.), beginning with helping the client learn the connection among  cognition, depressive feelings, and actions. 2. Eliminate or reduce the negative impact trauma related symptoms have  on social, occupational, and family functioning. Objective Learn and implement personal skills to manage challenging situations related to trauma. Target Date: 2024/01/04 Frequency: Weekly Progress: 10 Modality: individual 3. No longer avoids persons, places, activities, and objects that are  reminiscent of the traumatic event. Objective Participate in Eye Movement Desensitization and Reprocessing (EMDR) to reduce emotional distress  related to traumatic thoughts, feelings, and images. Target Date: 2024/01/04 Frequency: Weekly Progress: 0 Modality: individual   Related Interventions 1. Utilize Eye Movement Desensitization and Reprocessing (EMDR) to reduce the client's  emotional reactivity to the  traumatic event and reduce PTSD symptoms. Objective Learn and implement guided self-dialogue to manage thoughts, feelings, and urges brought on by  encounters with trauma-related situations. Target Date: 2025-11/07 Frequency: Weekly Progress: 10 Modality: individual Related Interventions 1. Teach the client a guided self-dialogue procedure in which he/she learns to recognize  maladaptive self-talk, challenges its biases, copes with engendered feelings, overcomes  avoidance, and reinforces his/her accomplishments; review and reinforce progress, problemsolve obstacles. 4. No longer experiences intrusive event recollections, avoidance of event  reminders, intense arousal, or disinterest in activities or  relationships. 5. Thinks about or openly discusses the traumatic event with others  without experiencing psychological or physiological distress. Diagnosis Axis  none F43.10 (Posttraumatic stress disorder) - Open - [Signifier: n/a] Posttraumatic Stress  Disorder  Conditions For Discharge Achievement of treatment goals and objectives   Danielle Bruce Danielle Leoni, LCSW

## 2023-08-21 ENCOUNTER — Ambulatory Visit: Admitting: Psychology

## 2023-08-28 ENCOUNTER — Ambulatory Visit (INDEPENDENT_AMBULATORY_CARE_PROVIDER_SITE_OTHER): Admitting: Psychology

## 2023-08-28 DIAGNOSIS — H9190 Unspecified hearing loss, unspecified ear: Secondary | ICD-10-CM | POA: Diagnosis not present

## 2023-08-28 DIAGNOSIS — F4310 Post-traumatic stress disorder, unspecified: Secondary | ICD-10-CM

## 2023-08-28 DIAGNOSIS — D472 Monoclonal gammopathy: Secondary | ICD-10-CM | POA: Diagnosis not present

## 2023-08-28 DIAGNOSIS — F422 Mixed obsessional thoughts and acts: Secondary | ICD-10-CM

## 2023-08-28 DIAGNOSIS — E538 Deficiency of other specified B group vitamins: Secondary | ICD-10-CM | POA: Diagnosis not present

## 2023-08-28 DIAGNOSIS — G43909 Migraine, unspecified, not intractable, without status migrainosus: Secondary | ICD-10-CM | POA: Diagnosis not present

## 2023-08-28 DIAGNOSIS — F902 Attention-deficit hyperactivity disorder, combined type: Secondary | ICD-10-CM | POA: Diagnosis not present

## 2023-08-28 DIAGNOSIS — M797 Fibromyalgia: Secondary | ICD-10-CM | POA: Diagnosis not present

## 2023-08-28 DIAGNOSIS — R7301 Impaired fasting glucose: Secondary | ICD-10-CM | POA: Diagnosis not present

## 2023-08-28 DIAGNOSIS — E7800 Pure hypercholesterolemia, unspecified: Secondary | ICD-10-CM | POA: Diagnosis not present

## 2023-08-28 DIAGNOSIS — K589 Irritable bowel syndrome without diarrhea: Secondary | ICD-10-CM | POA: Diagnosis not present

## 2023-08-28 NOTE — Progress Notes (Signed)
 [facility] Counselor/Therapist Progress Note  Patient ID: TASHAY BOZICH, MRN: [id number],    Date:08/28/2023  Time Spent: 60 minutes Time In:  3:00 Time out: 4:00  Treatment Type: Individual Therapy  Reported Symptoms: flashbacks, responding to triggers, depression, anxiety, being busy all of the time, dissociation  Mental Status Exam: Appearance:  Casual     Behavior: Appropriate  Motor: normal  Speech/Language:  Clear and Coherent  Affect: Blunt  Mood: pleasant  Thought process: normal  Thought content:   WNL  Sensory/Perceptual disturbances:   WNL  Orientation: oriented to person, place, time/date, and situation  Attention: Good  Concentration: Good  Memory: WNL  Fund of knowledge:  Good  Insight:   Good  Judgment:  Fair  Impulse Control: Good   Risk Assessment: Danger to Self:  No Self-injurious Behavior: No Danger to Others: No Duty to Warn:no Physical Aggression / Violence:No  Access to Firearms a concern: No  Gang Involvement:No   Subjective: The patient attended a face-to-face individual therapy in the office today.  The patient presents as pleasant and cooperative.  She reports that she stayed an extra week with her daughter to help her daughter out with the children.  She seemed real happy to do that and I do believe her daughter was appreciative of her time and effort.  We started talking more about different targets that we were going to focus on during the EMDR.  We talked about the dynamics in her family of origin as to why she might have chosen the people that she chose when she was choosing the people she married.  It does seem that she was protected as a child and I do believe that she got the message that she was supposed to people please and not stand up for herself from her mother and her church.  The patient seemed to gain some insight about herself in the conversation today. Interventions: Cognitive Behavioral Therapy, Mindfulness  Meditation, Eye Movement Desensitization and Reprocessing (EMDR), Insight-Oriented, and Interpersonal, psychoeducation  Diagnosis:PTSD (post-traumatic stress disorder)  Mixed obsessional thoughts and acts  Attention deficit hyperactivity disorder (ADHD), combined type  Plan: Plan of Care: Client Abilities/Strengths  Insightful, motivated, Supportive family Client Treatment Preferences  Outpatient Individual therapy/EMDR  Client Statement of Needs  I think I have PTSD and I feel like I need another kind of therapy than talk therapy Treatment Level  Outpatient Individual therapy  Symptoms  Demonstrates an exaggerated startle response.:  (Status: maintained). Depressed  or irritable mood.: (Status:maintained). Describes a reliving of the event,  particularly through dissociative flashbacks.: (Status: maintained). Displays a  significant decline in interest and engagement in activities.:  (Status: maintained). Displays significant psychological and/or physiological distress resulting from internal and external  clues that are reminiscent of the traumatic event.: (Status: maintained).  Experiences disturbances in sleep.:  (Status: maintained). Experiences disturbing  and persistent thoughts, images, and/or perceptions of the traumatic event.:  (Status: maintained). Experiences frequent nightmares.: (Status: maintained).  Feelings of hopelessness, worthlessness, or inappropriate guilt.: No Description Entered (Status:  improved). Has been exposed to a traumatic event involving actual or perceived threat of death or  serious injury.:  (Status: maintained). Impairment in social, occupational, or  other areas of functioning.:(Status: maintained). Intentionally avoids activities,  places, people, or objects (e.g., up-armored vehicles) that evoke memories of the event.: (Status: maintained). Intentionally avoids thoughts, feelings, or discussions related  to the traumatic event.: (Status:  maintained). Reports difficulty concentrating as  well as feelings of guilt (  Status:maintained). Reports response of intense fear,  helplessness, or horror to the traumatic event.:  (Status: maintained).  Problems Addressed  Unipolar Depression, Posttraumatic Stress Disorder (PTSD), Posttraumatic Stress Disorder (PTSD),   Posttraumatic Stress Disorder (PTSD), Posttraumatic Stress Disorder (PTSD)  Goals 1. Develop healthy thinking patterns and beliefs about self, others, and the world that lead to the alleviation and help prevent the relapse of  depression. Objective Identify and replace thoughts and beliefs that support depression. Target Date: 2024/01/04 Frequency: Weekly Progress: 60 Modality: individual Related Interventions 1. Explore and restructure underlying assumptions and beliefs reflected in biased self-talk that  may put the client at risk for relapse or recurrence. 2. Conduct Cognitive-Behavioral Therapy (see Cognitive Behavior Therapy by Almarie; Overcoming Depression by Marine dunker al.), beginning with helping the client learn the connection among  cognition, depressive feelings, and actions. 2. Eliminate or reduce the negative impact trauma related symptoms have  on social, occupational, and family functioning. Objective Learn and implement personal skills to manage challenging situations related to trauma. Target Date: 2024/01/04 Frequency: Weekly Progress: 10 Modality: individual 3. No longer avoids persons, places, activities, and objects that are  reminiscent of the traumatic event. Objective Participate in Eye Movement Desensitization and Reprocessing (EMDR) to reduce emotional distress  related to traumatic thoughts, feelings, and images. Target Date: 2024/01/04 Frequency: Weekly Progress: 0 Modality: individual   Related Interventions 1. Utilize Eye Movement Desensitization and Reprocessing (EMDR) to reduce the client's  emotional reactivity to the traumatic  event and reduce PTSD symptoms. Objective Learn and implement guided self-dialogue to manage thoughts, feelings, and urges brought on by  encounters with trauma-related situations. Target Date: 2025-11/07 Frequency: Weekly Progress: 10 Modality: individual Related Interventions 1. Teach the client a guided self-dialogue procedure in which he/she learns to recognize  maladaptive self-talk, challenges its biases, copes with engendered feelings, overcomes  avoidance, and reinforces his/her accomplishments; review and reinforce progress, problemsolve obstacles. 4. No longer experiences intrusive event recollections, avoidance of event  reminders, intense arousal, or disinterest in activities or  relationships. 5. Thinks about or openly discusses the traumatic event with others  without experiencing psychological or physiological distress. Diagnosis Axis  none F43.10 (Posttraumatic stress disorder) - Open - [Signifier: n/a] Posttraumatic Stress  Disorder  Conditions For Discharge Achievement of treatment goals and objectives   Leala Bryand G Gurinder Toral, LCSW

## 2023-08-29 ENCOUNTER — Encounter (INDEPENDENT_AMBULATORY_CARE_PROVIDER_SITE_OTHER): Payer: Self-pay

## 2023-09-04 ENCOUNTER — Ambulatory Visit (INDEPENDENT_AMBULATORY_CARE_PROVIDER_SITE_OTHER): Admitting: Psychology

## 2023-09-04 DIAGNOSIS — F4310 Post-traumatic stress disorder, unspecified: Secondary | ICD-10-CM | POA: Diagnosis not present

## 2023-09-04 DIAGNOSIS — F422 Mixed obsessional thoughts and acts: Secondary | ICD-10-CM | POA: Diagnosis not present

## 2023-09-04 DIAGNOSIS — F902 Attention-deficit hyperactivity disorder, combined type: Secondary | ICD-10-CM

## 2023-09-06 DIAGNOSIS — H31003 Unspecified chorioretinal scars, bilateral: Secondary | ICD-10-CM | POA: Diagnosis not present

## 2023-09-06 DIAGNOSIS — H25013 Cortical age-related cataract, bilateral: Secondary | ICD-10-CM | POA: Diagnosis not present

## 2023-09-06 DIAGNOSIS — H2513 Age-related nuclear cataract, bilateral: Secondary | ICD-10-CM | POA: Diagnosis not present

## 2023-09-06 DIAGNOSIS — H43813 Vitreous degeneration, bilateral: Secondary | ICD-10-CM | POA: Diagnosis not present

## 2023-09-06 DIAGNOSIS — H25043 Posterior subcapsular polar age-related cataract, bilateral: Secondary | ICD-10-CM | POA: Diagnosis not present
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
## 2023-09-11 ENCOUNTER — Ambulatory Visit (INDEPENDENT_AMBULATORY_CARE_PROVIDER_SITE_OTHER): Admitting: Psychology

## 2023-09-11 DIAGNOSIS — F902 Attention-deficit hyperactivity disorder, combined type: Secondary | ICD-10-CM | POA: Diagnosis not present

## 2023-09-11 DIAGNOSIS — F4310 Post-traumatic stress disorder, unspecified: Secondary | ICD-10-CM

## 2023-09-11 DIAGNOSIS — F422 Mixed obsessional thoughts and acts: Secondary | ICD-10-CM | POA: Diagnosis not present

## 2023-09-12 ENCOUNTER — Other Ambulatory Visit: Payer: Self-pay

## 2023-09-12 ENCOUNTER — Telehealth: Payer: Self-pay | Admitting: Psychiatry

## 2023-09-12 ENCOUNTER — Ambulatory Visit: Admitting: Psychiatry

## 2023-09-12 DIAGNOSIS — F902 Attention-deficit hyperactivity disorder, combined type: Secondary | ICD-10-CM

## 2023-09-12 MED ORDER — AMPHETAMINE-DEXTROAMPHET ER 20 MG PO CP24: 20.0000 mg | ORAL_CAPSULE | Freq: Every day | ORAL | 0 refills | Status: DC

## 2023-09-12 NOTE — Telephone Encounter (Signed)
 Next appt is 10/11/23. Requesting a refill on Adderall XR 20 mg called to:  [id number] - [place], [place] - [address] AT [facility] [address] [facility] [address]   Phone: [phone]  Fax: [phone]

## 2023-09-12 NOTE — Progress Notes (Signed)
 [facility] Counselor/Therapist Progress Note  Patient ID: CYDNE GRAHN, MRN: [id number],    Date:09/11/2023  Time Spent: 60 minutes Time In:  3:02 Time out: 4:02  Treatment Type: Individual Therapy  Reported Symptoms: flashbacks, responding to triggers, depression, anxiety, being busy all of the time, dissociation  Mental Status Exam: Appearance:  Casual     Behavior: Appropriate  Motor: normal  Speech/Language:  Clear and Coherent  Affect: Blunt  Mood: Pleasant   Thought process: normal  Thought content:   WNL  Sensory/Perceptual disturbances:   WNL  Orientation: oriented to person, place, time/date, and situation  Attention: Good  Concentration: Good  Memory: WNL  Fund of knowledge:  Good  Insight:   Good  Judgment:  Fair  Impulse Control: Good   Risk Assessment: Danger to Self:  No Self-injurious Behavior: No Danger to Others: No Duty to Warn:no Physical Aggression / Violence:No  Access to Firearms a concern: No  Gang Involvement:No   Subjective: The patient attended a face-to-face individual therapy in the office today.  The patient presents as pleasant and cooperative.    The patient reports that she is ready to get started with the EMDR.  We talked about the progress that she has made so far and it does seem that she is much better able to be less reactive and more responsive.  This has been just from the therapy that we have done as we have not actually gotten into the EMDR yet.  The patient feels that she is gaining so much from being in therapy at this time.  We also talked about my upcoming retirement and talked about wanting to get through the EMDR prior to my retirement.  I think that we can start the EMDR relatively soon and that we will hopefully have her in a good processing place by the end of October or November. Interventions: Cognitive Behavioral Therapy, Mindfulness Meditation, Eye Movement Desensitization and Reprocessing (EMDR),  Insight-Oriented, and Interpersonal, psychoeducation  Diagnosis:PTSD (post-traumatic stress disorder)  Mixed obsessional thoughts and acts  Attention deficit hyperactivity disorder (ADHD), combined type  Plan: Plan of Care: Client Abilities/Strengths  Insightful, motivated, Supportive family Client Treatment Preferences  Outpatient Individual therapy/EMDR  Client Statement of Needs  I think I have PTSD and I feel like I need another kind of therapy than talk therapy Treatment Level  Outpatient Individual therapy  Symptoms  Demonstrates an exaggerated startle response.:  (Status: maintained). Depressed  or irritable mood.: (Status:maintained). Describes a reliving of the event,  particularly through dissociative flashbacks.: (Status: maintained). Displays a  significant decline in interest and engagement in activities.:  (Status: maintained). Displays significant psychological and/or physiological distress resulting from internal and external  clues that are reminiscent of the traumatic event.: (Status: maintained).  Experiences disturbances in sleep.:  (Status: maintained). Experiences disturbing  and persistent thoughts, images, and/or perceptions of the traumatic event.:  (Status: maintained). Experiences frequent nightmares.: (Status: maintained).  Feelings of hopelessness, worthlessness, or inappropriate guilt.: No Description Entered (Status:  improved). Has been exposed to a traumatic event involving actual or perceived threat of death or  serious injury.:  (Status: maintained). Impairment in social, occupational, or  other areas of functioning.:(Status: maintained). Intentionally avoids activities,  places, people, or objects (e.g., up-armored vehicles) that evoke memories of the event.: (Status: maintained). Intentionally avoids thoughts, feelings, or discussions related  to the traumatic event.: (Status: maintained). Reports difficulty concentrating as  well as feelings of  guilt (Status:maintained). Reports response of intense fear,  helplessness, or horror to the traumatic event.:  (Status: maintained).  Problems Addressed  Unipolar Depression, Posttraumatic Stress Disorder (PTSD), Posttraumatic Stress Disorder (PTSD),   Posttraumatic Stress Disorder (PTSD), Posttraumatic Stress Disorder (PTSD)  Goals 1. Develop healthy thinking patterns and beliefs about self, others, and the world that lead to the alleviation and help prevent the relapse of  depression. Objective Identify and replace thoughts and beliefs that support depression. Target Date: 2024/01/04 Frequency: Weekly Progress: 60 Modality: individual Related Interventions 1. Explore and restructure underlying assumptions and beliefs reflected in biased self-talk that  may put the client at risk for relapse or recurrence. 2. Conduct Cognitive-Behavioral Therapy (see Cognitive Behavior Therapy by Almarie; Overcoming Depression by Marine dunker al.), beginning with helping the client learn the connection among  cognition, depressive feelings, and actions. 2. Eliminate or reduce the negative impact trauma related symptoms have  on social, occupational, and family functioning. Objective Learn and implement personal skills to manage challenging situations related to trauma. Target Date: 2024/01/04 Frequency: Weekly Progress: 10 Modality: individual 3. No longer avoids persons, places, activities, and objects that are  reminiscent of the traumatic event. Objective Participate in Eye Movement Desensitization and Reprocessing (EMDR) to reduce emotional distress  related to traumatic thoughts, feelings, and images. Target Date: 2024/01/04 Frequency: Weekly Progress: 0 Modality: individual   Related Interventions 1. Utilize Eye Movement Desensitization and Reprocessing (EMDR) to reduce the client's  emotional reactivity to the traumatic event and reduce PTSD symptoms. Objective Learn and implement guided  self-dialogue to manage thoughts, feelings, and urges brought on by  encounters with trauma-related situations. Target Date: 2025-11/07 Frequency: Weekly Progress: 10 Modality: individual Related Interventions 1. Teach the client a guided self-dialogue procedure in which he/she learns to recognize  maladaptive self-talk, challenges its biases, copes with engendered feelings, overcomes  avoidance, and reinforces his/her accomplishments; review and reinforce progress, problemsolve obstacles. 4. No longer experiences intrusive event recollections, avoidance of event  reminders, intense arousal, or disinterest in activities or  relationships. 5. Thinks about or openly discusses the traumatic event with others  without experiencing psychological or physiological distress. Diagnosis Axis  none F43.10 (Posttraumatic stress disorder) - Open - [Signifier: n/a] Posttraumatic Stress  Disorder  Conditions For Discharge Achievement of treatment goals and objectives   Esperansa Sarabia G Fernie Grimm, LCSW                                                                              Katerin Negrete G Kallen Mccrystal, LCSW

## 2023-09-12 NOTE — Telephone Encounter (Signed)
 Pended.

## 2023-09-13 ENCOUNTER — Other Ambulatory Visit (HOSPITAL_COMMUNITY): Payer: Self-pay | Admitting: Student

## 2023-09-13 DIAGNOSIS — R1013 Epigastric pain: Secondary | ICD-10-CM | POA: Diagnosis not present

## 2023-09-13 DIAGNOSIS — R194 Change in bowel habit: Secondary | ICD-10-CM | POA: Diagnosis not present

## 2023-09-13 DIAGNOSIS — R109 Unspecified abdominal pain: Secondary | ICD-10-CM | POA: Diagnosis not present

## 2023-09-13 DIAGNOSIS — K5900 Constipation, unspecified: Secondary | ICD-10-CM | POA: Diagnosis not present

## 2023-09-16 ENCOUNTER — Other Ambulatory Visit: Payer: Self-pay | Admitting: Psychiatry

## 2023-09-16 DIAGNOSIS — F5105 Insomnia due to other mental disorder: Secondary | ICD-10-CM

## 2023-09-18 ENCOUNTER — Ambulatory Visit (INDEPENDENT_AMBULATORY_CARE_PROVIDER_SITE_OTHER): Admitting: Psychology

## 2023-09-18 DIAGNOSIS — F4310 Post-traumatic stress disorder, unspecified: Secondary | ICD-10-CM

## 2023-09-18 DIAGNOSIS — F422 Mixed obsessional thoughts and acts: Secondary | ICD-10-CM

## 2023-09-18 DIAGNOSIS — F902 Attention-deficit hyperactivity disorder, combined type: Secondary | ICD-10-CM | POA: Diagnosis not present

## 2023-09-18 NOTE — Therapy (Signed)
 OUTPATIENT PHYSICAL THERAPY TREATMENT     Patient Name: Danielle Bruce MRN: [id number] DOB:Jul 08, 1960, 63 y.o., female Today's Date: 09/19/2023  PT End of Session - 09/19/23 1140     Visit Number 1    Date for PT Re-Evaluation 11/14/23    Authorization Type Medicare-KX needed    Progress Note Due on Visit 10    PT Start Time 1104    PT Stop Time 1141    PT Time Calculation (min) 37 min    Activity Tolerance Patient tolerated treatment well    Behavior During Therapy [facility] for tasks assessed/performed               Past Medical History:  Diagnosis Date   Abdominal pain, unspecified site 10/18/2012   Anemia    Anxiety    Arthritis    osteoarthritis   ASCUS (atypical squamous cells of undetermined significance) on Pap smear 05/06/2005   NEG HIGH RISK HPV--C&B BIOPSY BENIGN 12/2005   Asthma    Bipolar 2 disorder (HCC)    Cancer (HCC)    skin cancer - basal cell   Colon polyps    Complication of anesthesia    anxious afterwards, will get headaches    Constipation    Depression    Fibromyalgia 10/2013   GERD (gastroesophageal reflux disease)    Hearing loss on left    Heart murmur    never had any problems   Hemorrhoids    High cholesterol    High risk HPV infection 08/2011   cytology negative   IBS (irritable bowel syndrome)    Insomnia    Lymphocytic colitis    Lymphoma (HCC)    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 Cornett, MD;   Location: [facility];  Service: General;  Laterality: N/A;   COLONOSCOPY     numerous times   DILATION AND CURETTAGE OF UTERUS     ESOPHAGOGASTRODUODENOSCOPY     HEMORRHOID SURGERY  1993   x3   IUD REMOVAL  02/2015   Mirena   LAPAROSCOPIC LYSIS OF ADHESIONS N/A 10/16/2012   Procedure: LAPAROSCOPIC LYSIS OF ADHESIONS;  Surgeon: Debby A. Cornett, MD;  Location: [facility];  Service: General;  Laterality: N/A;   LAPAROSCOPY N/A 10/16/2012   Procedure: LAPAROSCOPY DIAGNOSTIC;  Surgeon: Debby LABOR. Cornett, MD;  Location: [facility];  Service: General;  Laterality: N/A;   PELVIC LAPAROSCOPY     RADIOLOGY WITH ANESTHESIA N/A 12/16/2015   Procedure: MRI OF BRAIN WITH AND WITHOUT CONTRAST;  Surgeon: Medication Radiologist, MD;  Location: [facility];  Service: Radiology;  Laterality: N/A;   SHOULDER SURGERY  2007/2008   SPINE SURGERY  2010   cervical   Patient Active Problem List   Diagnosis Date Noted   GAD (generalized anxiety disorder) 12/26/2017   OCD (obsessive compulsive disorder) 12/26/2017   PTSD (post-traumatic stress disorder) 12/26/2017   DDD (degenerative disc disease), cervical 07/14/2016   Primary osteoarthritis  of both feet 07/14/2016   Primary osteoarthritis of both hands 07/14/2016   Other fatigue 07/14/2016   History of IBS 07/14/2016   Osteopenia of multiple sites 07/14/2016   Fibromyalgia 01/13/2016   MGUS (monoclonal gammopathy of unknown significance) 12/23/2013   Chronic cholecystitis without calculus 10/18/2012   Abdominal pain 10/18/2012   Nausea alone 10/18/2012   Constipation 10/18/2012   Depression 10/18/2012   Anxiety    ASCUS (atypical squamous cells of undetermined significance) on Pap smear    IUD    Hemorrhoids 11/29/2010   Abdominal pain, left upper quadrant 11/29/2010    PCP:  Joen Gentry, MD  REFERRING PROVIDER: Joen Gentry, MD  REFERRING DIAG: fibromyalgia  THERAPY DIAG:  Abnormal posture - Plan: PT plan of care cert/re-cert  Cramp and spasm - Plan:  PT plan of care cert/re-cert  Muscle weakness (generalized) - Plan: PT plan of care cert/re-cert  Polymyalgia rheumatica (HCC) - Plan: PT plan of care cert/re-cert  Cervicalgia - Plan: PT plan of care cert/re-cert  Rationale for Evaluation and Treatment Rehabilitation  ONSET DATE: chronic pain (fibromyalgia), bowel flare up 1.5 months ago  SUBJECTIVE:                                                                                                                                                                                                         SUBJECTIVE STATEMENT:  Pt is well known to this PT for treatment of chronic pain, functional strength deficits and progression of exercise.  She has a complex medical history including fibromyalgia, depression, PTSD, anxiety and bipolar disorder.  She has responded well to PT in the past.   Pt reports that she is having GI flare up over the past few months and has been undergoing testing for this. She has lost weight recently and they are trying to figure out what is going on . Pt reports that she is exhausted from constant pain.   MD would like her to get back into PT and get feeling better.  She also wants pt to get back in the pool to help the edema in her abdominal region.     PERTINENT HISTORY:  Anxiety, bipolar disorder, depression, fibromyalgia, IBS, migraines, osteopenia, PTSD  PAIN:  Are you having pain: yes Pain location: Rt upper trap/neck, Rt>Lt jaw  Pain rating: 5/10 Pain description: irritating, annoying   Aggravating factors: doing too much, stress  Relieving factors: muscle relaxers, rest, stretching  PRECAUTIONS: Other: chronic pain syndrome and depression Other: slow progression with exercise due to chronic condition  WEIGHT BEARING  RESTRICTIONS No  FALLS:  Has patient fallen in last 6 months? No  LIVING ENVIRONMENT: Lives with: lives with their family Lives in: House/apartment  OCCUPATION: on disability  PLOF:  Independent with basic ADLs and Leisure: yardwork, walking  Pt cares for her 4 young grandchildren- difficulty with care including lifting and carrying  PATIENT GOALS be more active with less pain, exercise at the gym regularly, lift and carry grandchildren and laundry, improved ease with yardwork and housework, resume exercise  OBJECTIVE:   DIAGNOSTIC FINDINGS: none recent   PATIENT SURVEYS:  The Patient-Specific Functional Scale  Initial:  I am going to ask you to identify up to 3 important activities that you are unable to do or are having difficulty with as a result of this problem.  Today are there any activities that you are unable to do or having difficulty with because of this?  (Patient shown scale and patient rated each activity)  Follow up: When you first came in you had difficulty performing these activities.  Today do you still have difficulty?  Patient-Specific activity scoring scheme (Point to one number):  0 1 2 3 4 5 6 7 8 9  10 Unable                                                                                                          Able to perform To perform                                                                                                    activity at the same Activity         Level as before                                                                                                                       Injury or problem  Activity   Lifting laundry and groceries/housework                           initial:  3/10  2.    Yard work- up and down with squatting  Initial: 2/10     COGNITION: Overall cognitive status: Within functional limits for tasks assessed  SENSATION: WFL  POSTURE: rounded shoulders and forward head  PALPATION: Diffuse palpable tenderness over bil neck, upper traps, thoracic, lumbar and gluteals with trigger points.    CERVICAL ROM:   Stiff at end range and pain on Rt with Lt motions.  All WFLs  UPPER  EXTREMITY ROM: UE A/ROM is limited by 20% into flexion and abduction with pain at end range.  Hip flexibility is limited by 25% in all directions with pain in all directions.  UPPER EXTREMITY MMT: UE: 4+/5, LE 4+/5 except hip flexors 4-/5  TODAY'S TREATMENT:  09/19/23:  Findings from evaluation discussed, pt educated on plan of care, HEP initiated.     HOME EXERCISE PROGRAM:  Access Code: [email or web address]- previously [email or web address]  Access Code: [email or web address] URL: [email or web address] Date: 09/19/2023 Prepared by: Burnard  Exercises - Seated Cervical Flexion AROM  - 3 x daily - 7 x weekly - 1 sets - 3 reps - 20 hold - Seated Cervical Sidebending AROM  - 3 x daily - 7 x weekly - 1 sets - 3 reps - 20 hold - Seated Cervical Rotation AROM  - 3 x daily - 7 x weekly - 1 sets - 3 reps - 20 hold - Seated Correct Posture  - 1 x daily - 7 x weekly - 3 sets - 10 reps - Seated Diaphragmatic Breathing  - 5 x daily - 7 x weekly - 1 sets - 10 reps - Sidelying Open Book Thoracic Rotation with Knee on Foam Roll  - 2 x daily - 7 x weekly - 3 sets - 10 reps - Seated Transversus Abdominis Bracing  - 5 x daily - 7 x weekly - 1 sets - 10 reps - 5 hold - Seated Hamstring Stretch  - 3 x daily - 7 x weekly - 1 sets - 3 reps - 20 hold ASSESSMENT:  CLINICAL IMPRESSION: Patient is a 63 y.o. female who was seen today for physical therapy evaluation and treatment for chronic history of fibromyalgia and widespread pain. Pt was treated by this PT for advancement of exercises and manual to address muscle tension. Lapse in treatment since 06/2023.    Pt report that she has recently had a flare of GI symptoms and has had pain and chronic fatigue with this.  She is undergoing testing to determine the cause of her symptoms and recent weight loss. MD would like her to return to PT to get back to regular exercise, manage pain and hopes the aquatics might help with her abdominal symptoms (core distension). She is most challenged  with housework and yard work right now.   Patient will benefit from skilled PT to address the below impairments and improve overall function.     OBJECTIVE IMPAIRMENTS decreased activity tolerance, decreased endurance, difficulty walking, decreased strength, increased muscle spasms, impaired flexibility, improper body mechanics, postural dysfunction, and pain.   ACTIVITY LIMITATIONS carrying, lifting, sitting, standing, reach over head, hygiene/grooming, and locomotion level  PARTICIPATION LIMITATIONS: meal prep, cleaning, laundry, driving, community activity, and yard work  PERSONAL FACTORS Past/current experiences, Time since onset of injury/illness/exacerbation, and 3+ comorbidities: fibromyalgia depression, anxiety,  are also affecting patient's functional outcome.   REHAB POTENTIAL: Good  CLINICAL DECISION MAKING: Evolving/moderate complexity  EVALUATION COMPLEXITY: Moderate   GOALS: Goals reviewed with patient? Yes  SHORT TERM GOALS: Target date: 10/17/2023    Return to regular performance of HEP 4-5x/week to improve mobility  Baseline: has not been exercising regularly due to GI pain (09/19/23) Goal status: NEW  2.  Rate housework > or = to 4/10 on patient specific functional scale (PSFS) Baseline: 3/10 Goal status: NEW  3.  Verbalize and demonstrate abdominal massage daily to reduce GI pain Baseline:  Goal status: NEW    LONG TERM GOALS: Target date: 11/14/2023    Be independent in advanced HEP Baseline:  Goal status:  NEW  2.  Rate squatting for yard work > or = to 4/10 on PSFS Baseline: 2/10 Goal status:NEW  3.  Report > or = to 20% reduction in neck and thoracic pain with daily tasks  Baseline: Goal status: NEW  4.  Walk for exercise and perform regular exercises 3-5x/wk for exercise to improve endurance  Baseline:  Goal status: NEW  5.  Rate housework > or = to 5/10 on PSFS  Baseline: 3/10   Goal status:NEW    PLAN: PT FREQUENCY: 1-2x/week  PT  DURATION: 8 weeks  PLANNED INTERVENTIONS: 97164- PT Re-evaluation, 97110-Therapeutic exercises, 97530- Therapeutic activity, 97112- Neuromuscular re-education, 97535- Self Care, [id number]- Manual therapy, [phone]- Gait training, [phone]- Aquatic Therapy, [phone]- Electrical stimulation (unattended), [phone]- Electrical stimulation (manual), [id number]- Traction (mechanical), 20560 (1-2 muscles), 20561 (3+ muscles)- Dry Needling, Patient/Family education, Balance training, Taping, Joint mobilization, Spinal manipulation, Spinal mobilization, Cryotherapy, Moist heat, Therapeutic exercises, Therapeutic activity, Neuromuscular re-education, and Self Care  PLAN FOR NEXT SESSION:  Work to get pt consistent with HEP, review abdominal massage, pool PT to address chronic fatigue and abdominal symptoms   Burnard Joy, PT 09/19/23 1:03 PM

## 2023-09-19 ENCOUNTER — Other Ambulatory Visit: Payer: Self-pay

## 2023-09-19 ENCOUNTER — Ambulatory Visit: Payer: Self-pay | Attending: Family Medicine

## 2023-09-19 DIAGNOSIS — R252 Cramp and spasm: Secondary | ICD-10-CM | POA: Insufficient documentation

## 2023-09-19 DIAGNOSIS — M542 Cervicalgia: Secondary | ICD-10-CM | POA: Diagnosis not present

## 2023-09-19 DIAGNOSIS — M6281 Muscle weakness (generalized): Secondary | ICD-10-CM | POA: Diagnosis not present

## 2023-09-19 DIAGNOSIS — R293 Abnormal posture: Secondary | ICD-10-CM | POA: Insufficient documentation

## 2023-09-19 DIAGNOSIS — M353 Polymyalgia rheumatica: Secondary | ICD-10-CM | POA: Insufficient documentation

## 2023-09-19 NOTE — Patient Instructions (Signed)
 Access Code: [email or web address] URL: [email or web address] Date: 09/19/2023 Prepared by: Burnard  Program Notes About Abdominal Massage  Abdominal massage, also called external colon massage, is a self-treatment circular massage technique that can reduce and eliminate gas and ease constipation. The colon naturally contracts in waves in a clockwise direction starting from inside the right hip, moving up toward the ribs, across the belly, and down inside the left hip. When you perform circular abdominal massage, you help stimulate your colon's normal wave pattern of movement called peristalsis.  It is most beneficial when done after eating.Positioning  You can practice abdominal massage with oil while lying down, or in the shower with soap.Some people find that it is just as effective to do the massage through clothing while sitting or standing.How to Massage  Start by placing your finger tips or knuckles on your right side, just inside your hip bone.    Make small      circular movements while you move upward toward your rib cage.  Once you reach      the bottom right side of your rib cage, take your circular movements across      to the left side of the bottom of your rib cage.   Next, move      downward until you reach the inside of your left hip bone.This is the path your feces travel in      your colon.  Continue to      perform your abdominal massage in this pattern for 10 minutes each day.    You can apply as much pressure as is comfortable in your massage.Start gently and build pressure as you continue to practice.Notice any areas of pain as you massage; areas of slight pain may be relieved as you massage, but if you have areas of significant or intense pain, consult with your healthcare provider.Other Considerations   General physical activity including bending and stretching can have a beneficial massage-like effect on the colon.Deep breathing can also stimulate the colon because breathing  deeply activates the same nervous system that supplies the colon.  Abdominal massage should always be used in combination with a bowel-conscious diet that is high in the proper type of fiber for you, fluids (primarily water), and a regular exercise program.   Abdominal massage, also called external colon massage, is a self-treatment circular massage technique that can reduce and eliminate gas and ease constipation. The colon naturally contracts in waves in a clockwise direction starting from inside the right hip, moving up toward the ribs, across the belly, and down inside the left hip.When you perform circular abdominal massage, you help stimulate your colon's normal wave pattern of movement called peristalsis.  It is most beneficial when done after eating.Positioning  You can practice abdominal massage with oil while lying down, or in the shower with soap.Some people find that it is just as effective to do the massage through clothing while sitting or standing.How to Massage  Start by placing your finger tips or knuckles on your right side, just inside your hip bone.    Make small      circular movements while you move upward toward your rib cage.  Once you reach      the bottom right side of your rib cage, take your circular movements across      to the left side of the bottom of your rib cage.   Next, move      downward until you reach the inside of your left  hip bone.This is the path your feces travel in      your colon.  Continue to      perform your abdominal massage in this pattern for 10 minutes each day.    You can apply as much pressure as is comfortable in your massage.Start gently and build pressure as you continue to practice.Notice any areas of pain as you massage; areas of slight pain may be relieved as you massage, but if you have areas of significant or intense pain, consult with your healthcare provider.Other Considerations   General physical activity including bending and stretching can  have a beneficial massage-like effect on the colon.Deep breathing can also stimulate the colon because breathing deeply activates the same nervous system that supplies the colon.  Abdominal massage should always be used in combination with a bowel-conscious diet that is high in the proper type of fiber for you, fluids (primarily water), and a regular exercise program.
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
## 2023-09-21 DIAGNOSIS — E8809 Other disorders of plasma-protein metabolism, not elsewhere classified: Secondary | ICD-10-CM | POA: Diagnosis not present

## 2023-09-21 DIAGNOSIS — D472 Monoclonal gammopathy: Secondary | ICD-10-CM | POA: Diagnosis not present

## 2023-09-25 ENCOUNTER — Ambulatory Visit: Admitting: Psychology

## 2023-09-26 ENCOUNTER — Ambulatory Visit (INDEPENDENT_AMBULATORY_CARE_PROVIDER_SITE_OTHER): Admitting: Psychology

## 2023-09-26 DIAGNOSIS — F422 Mixed obsessional thoughts and acts: Secondary | ICD-10-CM | POA: Diagnosis not present

## 2023-09-26 DIAGNOSIS — F902 Attention-deficit hyperactivity disorder, combined type: Secondary | ICD-10-CM

## 2023-09-26 DIAGNOSIS — F4310 Post-traumatic stress disorder, unspecified: Secondary | ICD-10-CM
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
## 2023-09-27 DIAGNOSIS — E8809 Other disorders of plasma-protein metabolism, not elsewhere classified: Secondary | ICD-10-CM | POA: Diagnosis not present

## 2023-09-27 DIAGNOSIS — D472 Monoclonal gammopathy: Secondary | ICD-10-CM | POA: Diagnosis not present

## 2023-10-02 ENCOUNTER — Ambulatory Visit (INDEPENDENT_AMBULATORY_CARE_PROVIDER_SITE_OTHER): Admitting: Psychology

## 2023-10-02 DIAGNOSIS — F902 Attention-deficit hyperactivity disorder, combined type: Secondary | ICD-10-CM

## 2023-10-02 DIAGNOSIS — F422 Mixed obsessional thoughts and acts: Secondary | ICD-10-CM | POA: Diagnosis not present

## 2023-10-02 DIAGNOSIS — F4310 Post-traumatic stress disorder, unspecified: Secondary | ICD-10-CM | POA: Diagnosis not present
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
## 2023-10-08 ENCOUNTER — Ambulatory Visit: Attending: Family Medicine

## 2023-10-08 DIAGNOSIS — R252 Cramp and spasm: Secondary | ICD-10-CM | POA: Diagnosis present

## 2023-10-08 DIAGNOSIS — M353 Polymyalgia rheumatica: Secondary | ICD-10-CM | POA: Insufficient documentation

## 2023-10-08 DIAGNOSIS — M542 Cervicalgia: Secondary | ICD-10-CM | POA: Insufficient documentation

## 2023-10-08 DIAGNOSIS — M79671 Pain in right foot: Secondary | ICD-10-CM

## 2023-10-08 DIAGNOSIS — R293 Abnormal posture: Secondary | ICD-10-CM | POA: Insufficient documentation

## 2023-10-08 DIAGNOSIS — M79672 Pain in left foot: Secondary | ICD-10-CM

## 2023-10-08 DIAGNOSIS — M6281 Muscle weakness (generalized): Secondary | ICD-10-CM | POA: Diagnosis not present

## 2023-10-08 DIAGNOSIS — M797 Fibromyalgia: Secondary | ICD-10-CM | POA: Insufficient documentation

## 2023-10-08 NOTE — Therapy (Signed)
 OUTPATIENT PHYSICAL THERAPY TREATMENT     Patient Name: Danielle Bruce MRN: [id number] DOB:06-13-1960, 63 y.o., female Today's Date: 10/08/2023  PT End of Session - 10/08/23 1112     Visit Number 2    Date for PT Re-Evaluation 11/14/23    Authorization Type Medicare-KX needed    Progress Note Due on Visit 10    PT Start Time 1015    PT Stop Time 1100    PT Time Calculation (min) 45 min    Activity Tolerance Patient tolerated treatment well    Behavior During Therapy [facility] for tasks assessed/performed                Past Medical History:  Diagnosis Date   Abdominal pain, unspecified site 10/18/2012   Anemia    Anxiety    Arthritis    osteoarthritis   ASCUS (atypical squamous cells of undetermined significance) on Pap smear 05/06/2005   NEG HIGH RISK HPV--C&B BIOPSY BENIGN 12/2005   Asthma    Bipolar 2 disorder (HCC)    Cancer (HCC)    skin cancer - basal cell   Colon polyps    Complication of anesthesia    anxious afterwards, will get headaches    Constipation    Depression    Fibromyalgia 10/2013   GERD (gastroesophageal reflux disease)    Hearing loss on left    Heart murmur    never had any problems   Hemorrhoids    High cholesterol    High risk HPV infection 08/2011   cytology negative   IBS (irritable bowel syndrome)    Insomnia    Lymphocytic colitis    Lymphoma (HCC)    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 Cornett, MD;   Location: [facility];  Service: General;  Laterality: N/A;   COLONOSCOPY     numerous times   DILATION AND CURETTAGE OF UTERUS     ESOPHAGOGASTRODUODENOSCOPY     HEMORRHOID SURGERY  1993   x3   IUD REMOVAL  02/2015   Mirena   LAPAROSCOPIC LYSIS OF ADHESIONS N/A 10/16/2012   Procedure: LAPAROSCOPIC LYSIS OF ADHESIONS;  Surgeon: Debby A. Cornett, MD;  Location: [facility];  Service: General;  Laterality: N/A;   LAPAROSCOPY N/A 10/16/2012   Procedure: LAPAROSCOPY DIAGNOSTIC;  Surgeon: Debby LABOR. Cornett, MD;  Location: [facility];  Service: General;  Laterality: N/A;   PELVIC LAPAROSCOPY     RADIOLOGY WITH ANESTHESIA N/A 12/16/2015   Procedure: MRI OF BRAIN WITH AND WITHOUT CONTRAST;  Surgeon: Medication Radiologist, MD;  Location: [facility];  Service: Radiology;  Laterality: N/A;   SHOULDER SURGERY  2007/2008   SPINE SURGERY  2010   cervical   Patient Active Problem List   Diagnosis Date Noted   GAD (generalized anxiety disorder) 12/26/2017   OCD (obsessive compulsive disorder) 12/26/2017   PTSD (post-traumatic stress disorder) 12/26/2017   DDD (degenerative disc disease), cervical 07/14/2016   Primary  osteoarthritis of both feet 07/14/2016   Primary osteoarthritis of both hands 07/14/2016   Other fatigue 07/14/2016   History of IBS 07/14/2016   Osteopenia of multiple sites 07/14/2016   Fibromyalgia 01/13/2016   MGUS (monoclonal gammopathy of unknown significance) 12/23/2013   Chronic cholecystitis without calculus 10/18/2012   Abdominal pain 10/18/2012   Nausea alone 10/18/2012   Constipation 10/18/2012   Depression 10/18/2012   Anxiety    ASCUS (atypical squamous cells of undetermined significance) on Pap smear    IUD    Hemorrhoids 11/29/2010   Abdominal pain, left upper quadrant 11/29/2010    PCP:  Joen Gentry, MD  REFERRING PROVIDER: Joen Gentry, MD  REFERRING DIAG: fibromyalgia  THERAPY DIAG:  Abnormal posture  Cramp and spasm  Muscle weakness (generalized)  Pain in  right foot  Cervicalgia  Polymyalgia rheumatica (HCC)  Pain in left foot  Rationale for Evaluation and Treatment Rehabilitation  ONSET DATE: chronic pain (fibromyalgia), bowel flare up 1.5 months ago  SUBJECTIVE:                                                                                                                                                                                                         SUBJECTIVE STATEMENT:  Pt recently went on vacation to the beach with her family, going into the water once. She states her stomach was upset throughout, and she had a headache. She states she is doing diagnostic colonoscopy on August 26 that she looks forward to, in hopes to get some answers.    PERTINENT HISTORY:  Anxiety, bipolar disorder, depression, fibromyalgia, IBS, migraines, osteopenia, PTSD  PAIN: 10/08/23 Are you having pain: yes Pain location: Rt upper trap/neck, Rt>Lt jaw  Pain rating: 4-5/10 Pain description: irritating, annoying   Aggravating factors: doing too much, stress  Relieving factors: muscle relaxers, rest, stretching  PRECAUTIONS: Other: chronic pain syndrome and depression Other: slow progression with exercise due to chronic condition  WEIGHT BEARING RESTRICTIONS No  FALLS:  Has patient fallen in last 6 months? No  LIVING ENVIRONMENT: Lives with: lives with their family Lives in: House/apartment  OCCUPATION: on disability  PLOF: Independent with basic ADLs and Leisure: yardwork, walking  Pt cares for her 4 young grandchildren- difficulty with care including lifting and carrying  PATIENT GOALS be more active with less pain, exercise at the gym regularly, lift and carry grandchildren and laundry, improved ease with yardwork and housework, resume exercise  OBJECTIVE:   DIAGNOSTIC FINDINGS: none recent   PATIENT SURVEYS:  The Patient-Specific Functional Scale  Initial:  I  am going to ask you to identify up to 3 important activities that  you are unable to do or are having difficulty with as a result of this problem.  Today are there any activities that you are unable to do or having difficulty with because of this?  (Patient shown scale and patient rated each activity)  Follow up: When you first came in you had difficulty performing these activities.  Today do you still have difficulty?  Patient-Specific activity scoring scheme (Point to one number):  0 1 2 3 4 5 6 7 8 9  10 Unable                                                                                                          Able to perform To perform                                                                                                    activity at the same Activity         Level as before                                                                                                                       Injury or problem  Activity   Lifting laundry and groceries/housework                           initial:  3/10  2.    Yard work- up and down with squatting          Initial: 2/10     COGNITION: Overall cognitive status: Within functional limits for tasks assessed  SENSATION: WFL  POSTURE: rounded shoulders and forward head  PALPATION: Diffuse palpable tenderness over bil neck, upper traps, thoracic, lumbar and gluteals with trigger points.    CERVICAL ROM:   Stiff at end range and pain on Rt with Lt motions.  All WFLs  UPPER EXTREMITY ROM: UE A/ROM is limited by 20% into flexion and abduction with pain at end range.  Hip flexibility is limited by 25% in all directions with pain  in all directions.  UPPER EXTREMITY MMT: UE: 4+/5, LE 4+/5 except hip flexors 4-/5  TODAY'S TREATMENT:   10/08/23 Nustep level 3 for 8 min- PT monitored and discussed pt status Seated hamstring stretch 2x20sec  Standing quad stretch 2x20 sec  Side lying open books with foam roller propped under top leg 1x15 each side Cat/Cow in quadruped position 1x10 each  side Side lying clam shells 1x15 each side Manual therapy: STM to paraspinals, bil upper traps, Rotator cuff muscles-to decrease muscle tension for pain reduction and increased functional capacity    09/19/23:  Findings from evaluation discussed, pt educated on plan of care, HEP initiated.     HOME EXERCISE PROGRAM:  Access Code: [email or web address]- previously provided  Access Code: [email or web address] URL: [email or web address] Date: 09/19/2023 Prepared by: Burnard  Exercises - Seated Cervical Flexion AROM  - 3 x daily - 7 x weekly - 1 sets - 3 reps - 20 hold - Seated Cervical Sidebending AROM  - 3 x daily - 7 x weekly - 1 sets - 3 reps - 20 hold - Seated Cervical Rotation AROM  - 3 x daily - 7 x weekly - 1 sets - 3 reps - 20 hold - Seated Correct Posture  - 1 x daily - 7 x weekly - 3 sets - 10 reps - Seated Diaphragmatic Breathing  - 5 x daily - 7 x weekly - 1 sets - 10 reps - Sidelying Open Book Thoracic Rotation with Knee on Foam Roll  - 2 x daily - 7 x weekly - 3 sets - 10 reps - Seated Transversus Abdominis Bracing  - 5 x daily - 7 x weekly - 1 sets - 10 reps - 5 hold - Seated Hamstring Stretch  - 3 x daily - 7 x weekly - 1 sets - 3 reps - 20 hold ASSESSMENT:  CLINICAL IMPRESSION:  Ms Westerhold presents to skilled PT with continuing complaints of UE pain, localized in the upper back/paraspinals. Pt demonstrated increased ROM in thoracic spinal twist. She required verbal and tactile cues during quad stretch. Pt reports she is getting back into her routine, continuing to perform self mobilization belly massages, and believes PT is improving her mobility and pain reduction.   Pt with tension in upper quarter musculature and active trigger points. Pt reported reduced pain after manual work. Pt continues having difficulty with housework and other daily related tasks. Pt would benefit from skilled PT to address these issues and the below impairments, to improve overall function.   OBJECTIVE  IMPAIRMENTS decreased activity tolerance, decreased endurance, difficulty walking, decreased strength, increased muscle spasms, impaired flexibility, improper body mechanics, postural dysfunction, and pain.   ACTIVITY LIMITATIONS carrying, lifting, sitting, standing, reach over head, hygiene/grooming, and locomotion level  PARTICIPATION LIMITATIONS: meal prep, cleaning, laundry, driving, community activity, and yard work  PERSONAL FACTORS Past/current experiences, Time since onset of injury/illness/exacerbation, and 3+ comorbidities: fibromyalgia depression, anxiety,  are also affecting patient's functional outcome.   REHAB POTENTIAL: Good  CLINICAL DECISION MAKING: Evolving/moderate complexity  EVALUATION COMPLEXITY: Moderate   GOALS: Goals reviewed with patient? Yes  SHORT TERM GOALS: Target date: 10/17/2023    Return to regular performance of HEP 4-5x/week to improve mobility  Baseline: has not been exercising regularly due to GI pain (09/19/23) Goal status: NEW  2.  Rate housework > or = to 4/10 on patient specific functional scale (PSFS) Baseline: 3/10 Goal status: Ongoing  3.  Verbalize and demonstrate abdominal massage daily to reduce GI pain Baseline:  Goal status: Met 8/11    LONG TERM GOALS: Target date: 11/14/2023    Be independent in advanced HEP Baseline:  Goal status:  Ongoing  2.  Rate squatting for yard work > or = to 4/10 on PSFS Baseline: 2/10 Goal status:NEW  3.  Report > or = to 20% reduction in neck and thoracic pain with daily tasks  Baseline: Goal status: NEW  4.  Walk for exercise and perform regular exercises 3-5x/wk for exercise to improve endurance  Baseline:  Goal status: NEW  5.  Rate housework > or = to 5/10 on PSFS  Baseline: 3/10   Goal status:NEW    PLAN: PT FREQUENCY: 1-2x/week  PT DURATION: 8 weeks  PLANNED INTERVENTIONS: 97164- PT Re-evaluation, 97110-Therapeutic exercises, 97530- Therapeutic activity, 97112-  Neuromuscular re-education, 97535- Self Care, [id number]- Manual therapy, [phone]- Gait training, [phone]- Aquatic Therapy, [phone]- Electrical stimulation (unattended), [phone]- Electrical stimulation (manual), [id number]- Traction (mechanical), 20560 (1-2 muscles), 20561 (3+ muscles)- Dry Needling, Patient/Family education, Balance training, Taping, Joint mobilization, Spinal manipulation, Spinal mobilization, Cryotherapy, Moist heat, Therapeutic exercises, Therapeutic activity, Neuromuscular re-education, and Self Care  PLAN FOR NEXT SESSION:  Work to get pt consistent with HEP, pool PT to address chronic fatigue and abdominal symptoms   Lavanda Cleverly, SPT 10/08/23 11:46 AM I agree with the following treatment note after reviewing documentation. This session was performed under the supervision of a licensed clinician.  Burnard Joy, PT 10/08/23 11:46 AM

## 2023-10-09 ENCOUNTER — Encounter (HOSPITAL_COMMUNITY)
Admission: RE | Admit: 2023-10-09 | Discharge: 2023-10-09 | Disposition: A | Discharge status: Home / Self Care | Source: Ambulatory Visit | Attending: Student

## 2023-10-09 ENCOUNTER — Ambulatory Visit: Admitting: Psychology

## 2023-10-09 DIAGNOSIS — R109 Unspecified abdominal pain: Secondary | ICD-10-CM | POA: Diagnosis not present

## 2023-10-09 DIAGNOSIS — F902 Attention-deficit hyperactivity disorder, combined type: Secondary | ICD-10-CM

## 2023-10-09 DIAGNOSIS — F4310 Post-traumatic stress disorder, unspecified: Secondary | ICD-10-CM

## 2023-10-09 DIAGNOSIS — F422 Mixed obsessional thoughts and acts: Secondary | ICD-10-CM | POA: Diagnosis not present

## 2023-10-09 MED ORDER — TECHNETIUM TC 99M SULFUR COLLOID
2.2000 | Freq: Once | INTRAVENOUS | Status: AC
  Administered 2023-10-09 (×2): 2.2 via ORAL
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
## 2023-10-10 ENCOUNTER — Ambulatory Visit: Admitting: Physical Therapy

## 2023-10-10 ENCOUNTER — Encounter: Payer: Self-pay | Admitting: Physical Therapy

## 2023-10-10 DIAGNOSIS — R293 Abnormal posture: Secondary | ICD-10-CM

## 2023-10-10 DIAGNOSIS — M542 Cervicalgia: Secondary | ICD-10-CM | POA: Diagnosis not present

## 2023-10-10 DIAGNOSIS — M6281 Muscle weakness (generalized): Secondary | ICD-10-CM | POA: Diagnosis not present

## 2023-10-10 DIAGNOSIS — M797 Fibromyalgia: Secondary | ICD-10-CM | POA: Diagnosis not present

## 2023-10-10 DIAGNOSIS — R252 Cramp and spasm: Secondary | ICD-10-CM

## 2023-10-10 DIAGNOSIS — M353 Polymyalgia rheumatica: Secondary | ICD-10-CM | POA: Diagnosis not present

## 2023-10-10 NOTE — Therapy (Signed)
 OUTPATIENT PHYSICAL THERAPY TREATMENT     Patient Name: Danielle Bruce MRN: [id number] DOB:[birth year], 63 y.o., female Today's Date: 10/10/2023  PT End of Session - 10/10/23 1018     Visit Number 3    Date for PT Re-Evaluation 11/14/23    Authorization Type Medicare-KX needed    Progress Note Due on Visit 10    PT Start Time 1015    PT Stop Time 1053    PT Time Calculation (min) 38 min    Activity Tolerance Patient tolerated treatment well    Behavior During Therapy [facility] for tasks assessed/performed                 Past Medical History:  Diagnosis Date   Abdominal pain, unspecified site 10/18/2012   Anemia    Anxiety    Arthritis    osteoarthritis   ASCUS (atypical squamous cells of undetermined significance) on Pap smear 05/06/2005   NEG HIGH RISK HPV--C&B BIOPSY BENIGN 12/2005   Asthma    Bipolar 2 disorder (HCC)    Cancer (HCC)    skin cancer - basal cell   Colon polyps    Complication of anesthesia    anxious afterwards, will get headaches    Constipation    Depression    Fibromyalgia 10/2013   GERD (gastroesophageal reflux disease)    Hearing loss on left    Heart murmur    never had any problems   Hemorrhoids    High cholesterol    High risk HPV infection 08/2011   cytology negative   IBS (irritable bowel syndrome)    Insomnia    Lymphocytic colitis    Lymphoma (HCC)    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 Cornett, MD;   Location: [facility];  Service: General;  Laterality: N/A;   COLONOSCOPY     numerous times   DILATION AND CURETTAGE OF UTERUS     ESOPHAGOGASTRODUODENOSCOPY     HEMORRHOID SURGERY  1993   x3   IUD REMOVAL  02/2015   Mirena   LAPAROSCOPIC LYSIS OF ADHESIONS N/A 10/16/2012   Procedure: LAPAROSCOPIC LYSIS OF ADHESIONS;  Surgeon: Debby A. Cornett, MD;  Location: [facility];  Service: General;  Laterality: N/A;   LAPAROSCOPY N/A 10/16/2012   Procedure: LAPAROSCOPY DIAGNOSTIC;  Surgeon: Debby LABOR. Cornett, MD;  Location: [facility];  Service: General;  Laterality: N/A;   PELVIC LAPAROSCOPY     RADIOLOGY WITH ANESTHESIA N/A 12/16/2015   Procedure: MRI OF BRAIN WITH AND WITHOUT CONTRAST;  Surgeon: Medication Radiologist, MD;  Location: [facility];  Service: Radiology;  Laterality: N/A;   SHOULDER SURGERY  2007/2008   SPINE SURGERY  2010   cervical   Patient Active Problem List   Diagnosis Date Noted   GAD (generalized anxiety disorder) 12/26/2017   OCD (obsessive compulsive disorder) 12/26/2017   PTSD (post-traumatic stress disorder) 12/26/2017   DDD (degenerative disc disease), cervical 07/14/2016  Primary osteoarthritis of both feet 07/14/2016   Primary osteoarthritis of both hands 07/14/2016   Other fatigue 07/14/2016   History of IBS 07/14/2016   Osteopenia of multiple sites 07/14/2016   Fibromyalgia 01/13/2016   MGUS (monoclonal gammopathy of unknown significance) 12/23/2013   Chronic cholecystitis without calculus 10/18/2012   Abdominal pain 10/18/2012   Nausea alone 10/18/2012   Constipation 10/18/2012   Depression 10/18/2012   Anxiety    ASCUS (atypical squamous cells of undetermined significance) on Pap smear    IUD    Hemorrhoids 11/29/2010   Abdominal pain, left upper quadrant 11/29/2010    PCP:  Joen Gentry, MD  REFERRING PROVIDER: Joen Gentry, MD  REFERRING DIAG: fibromyalgia  THERAPY DIAG:  Abnormal posture  Cramp and spasm  Muscle weakness (generalized)  Rationale  for Evaluation and Treatment Rehabilitation  ONSET DATE: chronic pain (fibromyalgia), bowel flare up 1.5 months ago  SUBJECTIVE:                                                                                                                                                                                                         SUBJECTIVE STATEMENT: Today is a good day.   PERTINENT HISTORY:  Anxiety, bipolar disorder, depression, fibromyalgia, IBS, migraines, osteopenia, PTSD  PAIN: 10/08/23 Are you having pain: Not much, just some random soreness Pain location: Rt upper trap/neck, Rt>Lt jaw  Pain rating: 2-4/10 Pain description: irritating, annoying   Aggravating factors: doing too much, stress  Relieving factors: muscle relaxers, rest, stretching  PRECAUTIONS: Other: chronic pain syndrome and depression Other: slow progression with exercise due to chronic condition  WEIGHT BEARING RESTRICTIONS No  FALLS:  Has patient fallen in last 6 months? No  LIVING ENVIRONMENT: Lives with: lives with their family Lives in: House/apartment  OCCUPATION: on disability  PLOF: Independent with basic ADLs and Leisure: yardwork, walking  Pt cares for her 4 young grandchildren- difficulty with care including lifting and carrying  PATIENT GOALS be more active with less pain, exercise at the gym regularly, lift and carry grandchildren and laundry, improved ease with yardwork and housework, resume exercise  OBJECTIVE:   DIAGNOSTIC FINDINGS: none recent   PATIENT SURVEYS:  The Patient-Specific Functional Scale  Initial:  I am going to ask you to identify up to 3 important activities that you are unable to do or are having difficulty with as a result of this problem.  Today are there any activities that you are unable to do or having difficulty with because of this?  (Patient shown scale and patient rated each  activity)  Follow up: When you first came in you had difficulty performing these  activities.  Today do you still have difficulty?  Patient-Specific activity scoring scheme (Point to one number):  0 1 2 3 4 5 6 7 8 9  10 Unable                                                                                                          Able to perform To perform                                                                                                    activity at the same Activity         Level as before                                                                                                                       Injury or problem  Activity   Lifting laundry and groceries/housework                           initial:  3/10  2.    Yard work- up and down with squatting          Initial: 2/10     COGNITION: Overall cognitive status: Within functional limits for tasks assessed  SENSATION: WFL  POSTURE: rounded shoulders and forward head  PALPATION: Diffuse palpable tenderness over bil neck, upper traps, thoracic, lumbar and gluteals with trigger points.    CERVICAL ROM:   Stiff at end range and pain on Rt with Lt motions.  All WFLs  UPPER EXTREMITY ROM: UE A/ROM is limited by 20% into flexion and abduction with pain at end range.  Hip flexibility is limited by 25% in all directions with pain in all directions.  UPPER EXTREMITY MMT: UE: 4+/5, LE 4+/5 except hip flexors 4-/5  TODAY'S TREATMENT:   10/10/23; Pt arrives for aquatic physical therapy. Treatment took place in 3.5-5.5 feet of water. Water temperature was 91 degrees F. Pt entered the pool via stairs independently. Pt requires buoyancy of water for support and  to offload joints with strengthening exercises.  Pt utilizes viscosity of the water required for strengthening. Seated water bench with 75% submersion Pt performed seated LE AROM exercises 20x in all planes for initial warm up and review of todays status. 75%-50% water depth for the following: water walking with UE push/pull with rainbow  wts 6x each direction, hip kicks Bil 10x (VC to use a medium resistance today) high knee marching with single buoy floats by herside 4 lengths. Low back stretch holding onto rails at the ladder/feet in the wall ladder: VC to let her tailbone be heavy: 3x 3 -4 breaths. Horseback bicycle 6 min on yellow noodle. End session with 2 min seated decompression using yellow noodle.  10/08/23 Nustep level 3 for 8 min- PT monitored and discussed pt status Seated hamstring stretch 2x20sec  Standing quad stretch 2x20 sec  Side lying open books with foam roller propped under top leg 1x15 each side Cat/Cow in quadruped position 1x10 each side Side lying clam shells 1x15 each side Manual therapy: STM to paraspinals, bil upper traps, Rotator cuff muscles-to decrease muscle tension for pain reduction and increased functional capacity    09/19/23:  Findings from evaluation discussed, pt educated on plan of care, HEP initiated.     HOME EXERCISE PROGRAM:  Access Code: [email or web address]- previously provided  Access Code: [email or web address] URL: [email or web address] Date: 09/19/2023 Prepared by: Burnard  Exercises - Seated Cervical Flexion AROM  - 3 x daily - 7 x weekly - 1 sets - 3 reps - 20 hold - Seated Cervical Sidebending AROM  - 3 x daily - 7 x weekly - 1 sets - 3 reps - 20 hold - Seated Cervical Rotation AROM  - 3 x daily - 7 x weekly - 1 sets - 3 reps - 20 hold - Seated Correct Posture  - 1 x daily - 7 x weekly - 3 sets - 10 reps - Seated Diaphragmatic Breathing  - 5 x daily - 7 x weekly - 1 sets - 10 reps - Sidelying Open Book Thoracic Rotation with Knee on Foam Roll  - 2 x daily - 7 x weekly - 3 sets - 10 reps - Seated Transversus Abdominis Bracing  - 5 x daily - 7 x weekly - 1 sets - 10 reps - 5 hold - Seated Hamstring Stretch  - 3 x daily - 7 x weekly - 1 sets - 3 reps - 20 hold ASSESSMENT:  CLINICAL IMPRESSION: Pt returns to aquatic PT today to address her chronic fatigue. She consistently feels  better exercising in the water but does require the occasional verbal reminder to be mindful in pacing herself as movement in a buoyant environment does in fact feel easy unitl you have to get out and deal with gravity. No GI distress exercising in the water.    OBJECTIVE IMPAIRMENTS decreased activity tolerance, decreased endurance, difficulty walking, decreased strength, increased muscle spasms, impaired flexibility, improper body mechanics, postural dysfunction, and pain.   ACTIVITY LIMITATIONS carrying, lifting, sitting, standing, reach over head, hygiene/grooming, and locomotion level  PARTICIPATION LIMITATIONS: meal prep, cleaning, laundry, driving, community activity, and yard work  PERSONAL FACTORS Past/current experiences, Time since onset of injury/illness/exacerbation, and 3+ comorbidities: fibromyalgia depression, anxiety,  are also affecting patient's functional outcome.   REHAB POTENTIAL: Good  CLINICAL DECISION MAKING: Evolving/moderate complexity  EVALUATION COMPLEXITY: Moderate   GOALS: Goals reviewed with patient? Yes  SHORT TERM GOALS: Target date: 10/17/2023    Return to regular performance of HEP 4-5x/week to  improve mobility  Baseline: has not been exercising regularly due to GI pain (09/19/23) Goal status: NEW  2.  Rate housework > or = to 4/10 on patient specific functional scale (PSFS) Baseline: 3/10 Goal status: Ongoing  3.  Verbalize and demonstrate abdominal massage daily to reduce GI pain Baseline:  Goal status: Met 8/11    LONG TERM GOALS: Target date: 11/14/2023    Be independent in advanced HEP Baseline:  Goal status:  Ongoing  2.  Rate squatting for yard work > or = to 4/10 on PSFS Baseline: 2/10 Goal status:NEW  3.  Report > or = to 20% reduction in neck and thoracic pain with daily tasks  Baseline: Goal status: NEW  4.  Walk for exercise and perform regular exercises 3-5x/wk for exercise to improve endurance  Baseline:  Goal  status: NEW  5.  Rate housework > or = to 5/10 on PSFS  Baseline: 3/10   Goal status:NEW    PLAN: PT FREQUENCY: 1-2x/week  PT DURATION: 8 weeks  PLANNED INTERVENTIONS: 97164- PT Re-evaluation, 97110-Therapeutic exercises, 97530- Therapeutic activity, 97112- Neuromuscular re-education, 97535- Self Care, [id number]- Manual therapy, [phone]- Gait training, [phone]- Aquatic Therapy, [phone]- Electrical stimulation (unattended), [phone]- Electrical stimulation (manual), [id number]- Traction (mechanical), 20560 (1-2 muscles), 20561 (3+ muscles)- Dry Needling, Patient/Family education, Balance training, Taping, Joint mobilization, Spinal manipulation, Spinal mobilization, Cryotherapy, Moist heat, Therapeutic exercises, Therapeutic activity, Neuromuscular re-education, and Self Care  PLAN FOR NEXT SESSION:  Work to get pt consistent with HEP, pool PT to address chronic fatigue and abdominal symptoms   Delon Darner, PTA 10/10/23 10:40 AM

## 2023-10-11 ENCOUNTER — Encounter: Payer: Self-pay | Admitting: Psychiatry

## 2023-10-11 ENCOUNTER — Ambulatory Visit (INDEPENDENT_AMBULATORY_CARE_PROVIDER_SITE_OTHER): Admitting: Psychiatry

## 2023-10-11 DIAGNOSIS — F902 Attention-deficit hyperactivity disorder, combined type: Secondary | ICD-10-CM

## 2023-10-11 DIAGNOSIS — F4001 Agoraphobia with panic disorder: Secondary | ICD-10-CM

## 2023-10-11 DIAGNOSIS — F411 Generalized anxiety disorder: Secondary | ICD-10-CM | POA: Diagnosis not present

## 2023-10-11 DIAGNOSIS — F422 Mixed obsessional thoughts and acts: Secondary | ICD-10-CM | POA: Diagnosis not present

## 2023-10-11 DIAGNOSIS — F4310 Post-traumatic stress disorder, unspecified: Secondary | ICD-10-CM | POA: Diagnosis not present

## 2023-10-11 DIAGNOSIS — M797 Fibromyalgia: Secondary | ICD-10-CM

## 2023-10-11 DIAGNOSIS — F331 Major depressive disorder, recurrent, moderate: Secondary | ICD-10-CM | POA: Diagnosis not present

## 2023-10-11 DIAGNOSIS — F5105 Insomnia due to other mental disorder: Secondary | ICD-10-CM | POA: Diagnosis not present

## 2023-10-11 MED ORDER — AMPHETAMINE-DEXTROAMPHET ER 10 MG PO CP24: 10.0000 mg | ORAL_CAPSULE | Freq: Every day | ORAL | 0 refills | Status: DC

## 2023-10-11 NOTE — Progress Notes (Signed)
 [name] [id number] [birth year] 63 y.o.   Subjective:   Patient ID:  Danielle Bruce is a 63 y.o. (DOB 07-24-1960) female.  Chief Complaint:  Chief Complaint  Patient presents with   Follow-up   Depression   Anxiety   Fatigue   ADD    HPI Danielle Bruce presents for follow-up of multiple dxes.  visit in April and taken off lamotrigine  DT possible skin reaction.  Had appt with [facility] dermatology and they assume that skin reaction with itching was related to lamotrigine . 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 Per oncology most likely drug is lamotrigine . And she weaned off of the medication to determine if the cutaneous lesions are related. Lesions seem less without lamotrigine .  At this point skin reaction is presumed related to lamotrigine .  No other disease sx. She is well aware that she has been on lamotrigine  for many years with good results for depression control. There is a risk of relapse as she comes off of the medication and she is worried about that. Smart phone helps her cognitive  And other productivity.    seen October 2020.  Restarted low dosage ADD bc concerns over STM, concentration and productivity.  Ritalin  5 mg twice daily.  Also suggested she retry N-acetylcysteine for mild cognitive complaints.   seen April 07, 2019 the following was noted: Mostly good.  Anxiety is mostly better, but depression is a little worse seasonally.  Not able to go help her daughter.   Low energy but does have motivation.  Ritalin  helps energy and focus but doesn't take it regularly bc irregular sleep cycle.   Ativan  only when having medical issues.    Therapy working on trauma issues is very hard.  Struggling with the fact she doesn't have memory of large parts of her past.   No manic sx off mood stabilizers.  Covid isolation reduce stressors overall and  not prominently depressed.   No meds were changed.  June 25, 2019 appointment the following is noted: F real worried about Covid despite vaccination.   Started fluvoxamine  25 on May 14, 2019 from PCP mainly for inflammation but possibly thinking she might be depressed. Seen benefit for energy and sleep pattern and more appropriate feelings and less numb.  Now realizes she had been depressed for awhile but just numb with depression.  Anniversaries of deaths of friends including last BF Danielle Bruce a little over a year ago.   Thinks of him daily. His 61 yo D died of unknown cause recently.  Stress D moved to beach. Wonders about increasing the fluvoxamine   [name] living in [place] and hasn't seen him in a year. [name] on disability for depression. Patient reports more depression.  Plan: Realized lately she's depressed and started Luvox  again for it and for the anti-inflammatory potential for her health.  Has seen some benefit.   Prior benefit at 25 mg daily.  Optiion increase but slightly bc med sensitive. AGREE increase fluvoxamine  to 37.5 mg daily.  08/06/2019 appointment with the following noted: Increased fluvoxamine  to 37.5 mg and it helped the depression. Could see a big difference and had been more depressed than she even realized. Outlook better and sleep pattern normalized.    More energy and motivation.   But also started having prolonged HA.  HA lasted a couple of weeks.   Assumed HA was DT increased fluvoxamine  so reduced to  25 mg on 07/28/19.  07/29/19 Episode of confusion occurred also.  Went to doctor while confused.  They couldn't determine cause.    Resolved in 1 day. FM flare up since fall/winter. To beach with daughter 3 mos minimum to give her shots for IVF treatment. Patient reports panic recently and recent difficulty with anxiety.    Occ flashbacks.   Patient denies difficulty with sleep initiation or maintenance. Denies appetite disturbance.  Patient reports that energy and  motivation have been good.  Patient denies any difficulty with concentration.  Patient denies any suicidal ideation. Plan: AGREE increase fluvoxamine  to 37.5 mg daily once HA is controlled for several weekcs.  01/01/20 appt with the following noted: Increased fluvoxamine  for awhile but got more HA and reduced it. Anxiety is better in general now.  But is interested in increasing it DT coming winter. When anxiety is not good it catches her off guard. Likely had Covid in August but early test was negative.  Got prednisone  but sick for 5 weeks.  Lost taste and smell still going on.  D also had Covid.  Felt really sick and terrified with bad mental health at the time.  Was traumatic for her.  Had been vaccinated.   Had panic attack at the ER and took a long time to get the Ativan .  D moving back to area and pt will have to help with childcare so hopes to more aggressively treat her health problems. 2 gkids and 2 more coming. [name] married without kids yet. Plan: started Luvox  again for it and for the anti-inflammatory potential for her health.  Has seen some benefit.   Prior benefit at 25 mg daily.  Optiion increase but slightly bc med sensitive. AGREE increase fluvoxamine  to 37.5 mg daily once HA is controlled for several weeks.  03/03/2020 appointment with the following noted: On Day 10 of Covid.  2nd bout. A lot easier this time. Had gotten Luvox  up to 50 mg daily and tolerated.  Then doubled it to 100 mg daily. Wonders about the proper dose for maintenance.   No taste and smell, exhausted, congestion and mild cough.  Energy is better than it was in the beginning.  Made her depressed and anxious again and that part is better also.  Last time had to go to ER with Covid.  Only ER visit in 2-3 years.  Less migraine than in the past. Overall was doing better with last couple months.  Trying to do anti-inflammatory diet.  Vegetarian, no dairy or gluten.  Natural sugars.   Still some anxiety. D moved  back here and pregnant with 2nd set of twins. Current twins are 61 yo.   Will restart trauma therapy and it's helped over the last couple of years.  More freedom from things.      Less daily dread with anxiety but still some panic which can be triggered.  Less OCD with fluvoxamine  unless triggered.   Taking Ritalin  10 mg each AM and tolerating and benefiting. occ NM with few dreams overall. Plan: started Luvox  again for it and for the anti-inflammatory potential for her health.  Has seen some benefit.   Prior benefit at 25 mg daily.  Med sensitive. AGREE increase fluvoxamine  to  50  mg daily. Up to 100 mg daily if desired.   04/22/19 appt with following noted: Mostly good and randomly psycho.  Really busy with [place] [redacted] weeks pregnant with twins and twins 61 yo at home.  She's helping and  her H is away. Mo older and slower.  Pt overworking.  When with [place] kids interfere with sleep.  Doesn't eat as well with [place] affects her FM and energy.   Recently exhausted and needed rest but couldn't go home DT D had to go to hospital.  Her H critical at times and she lost it emotionally over this. Almost suicidal reaction with weird intrusive thoughts in response to the criticism.   Got a break and it helped.   Rheum adjusted Lyrica to BID. Had accident fall with one of babies and was bleeding.  She became hysterical.   Upset now that she couldn't control her emotions at the time.  Couldn't get herself under control to deal with the crisis.    Overall doing ok but doesn't handle stressors well and lose it.   Plan: AGREE with increase fluvoxamine  to  50  mg daily. Up to 100 mg daily if desired.  06/23/2020 appointment with the following noted: Increased fluvoxamine  25 AM and 50 mg PM and tolerated it.  Been on this dose awhile.  Going to [place] May 6-19 and son taking her.  Working for [employer] and been successful. Also going to [place], [place].  Excited.   Occ overwhelmed. Starting therapy with  Kayla and looking forward to it and dealing with trauma stuff. Nervous about doing the therapy and getting decompensated like what happened last time. Prednisone  hellps mood and body. Not as depressed as in the past.  Sleep is reasonably good.  Appetite is normal.  Anxiety intermittent.  No suicidal thoughts Plan: No med changes  08/19/2020 appointment with the following noted: Not good.  Dog died 2 week ago and was very close to her for 16 years.  She had been a great comfort during losses and health problems.  Dog helped her through periods of SI. Exhausted. [name] in [place] and going in early July. Wonders how long this will last.  Also increased fluvoxamine  and now out and having withdrawal. Disc difference between grief and depression. Elsie needs therapist who takes Medicaid. Plan: no med changes  01/10/21 appt noted: Had covid 3rd time. Still on fluvox 50, Ritalin  10 mg daily, Deplin, trazodone  50 mg HS. Helping with gkids and been sick a lot since September. Been doing so so overall.  At times is good. Son-in-law will be getting raise and D can quit work for a year or so. [name] and his wife in [place] and that's good. Parents getting old. Both parents hearing problems and M more forgetful. Parents personality different.  Less therapy lately bc schedules.  Has dealt with a lot of trauma and will resume after new year and feels it has been helpful.  04/13/21 appt noted: More problems with appetite increase for a couple of years.  Always hungry.  Wonders about changing stimulant for this.  Weight up too much. Needs to lose 15+ # DT chronic pain including hips and knees. More depression.  Seasonally.  Episodic panic and anxiety chronic. Some intermittent conflict with daughter.   Needs Ativan  intermittently. Still working through trauma issues. Continues therapy.   Haven't gotten back into church.  Not wanting to go out much. Plan: Benefit  Ritalin  10 mg AM and noon for cognitive and  ADD reasons and disc risk of palpitations.  Per request ok trial Vyvanse  for binge eating and ADD instead of Ritalin  per PCP suggestion May also help mood and weight loss should help pain. Caution it could cause anxiety  06/06/2021 appointment  with the following noted: Did get a prescription for Vyvanse  30 mg on 04/28/2021 filled.  Tried that in place of Ritalin . Better energy and focus with Vyvanse  and less binge eating and lost 7-8 #.  Helped lose prednisone  weight from November. Seems to wear off in 5-6 hours.   In afternoon when it wears off just wants to sit around.  No sig SE. Thinks she is still a little depressed and not enjoying things like she should. Chronic colitis with recent flare.   To [place] in May for a couple of weeks with son and his wife. Plan: Per request ok trial Vyvanse  for binge eating and ADD and increase  to 40 mg daily per PCP suggestion  08/11/2021 appointment with the following noted: Tolerating increase Vyvanse  40 AM.  No SE.   Less crash with it and longer duration. Lost 14#.  Less craving.   Still kind of depressed.  B moved back after divorce.  F mad about it.  M health problems. Home more stressful.   Went to [place]. M acting odd and F cognitive problems. Poor sleep at home bc temp is so warm for parents. OC sx a little worse but not severe. Plan: Better with Vyvanse  for binge eating and ADD and increase  to 40 mg daily per PCP suggestion May also help mood and weight loss should help pain. Continue fluvoxamine  50 mg twice daily  11/16/2021 appointment noted: Helping with gkids. Son in Social worker still working out of state.  F not doing well with cognition and falls. 63yo. Handling stressors pretty well.  When irritable then takes a break. Enjoys grandkids 4 of them. D therapist and not working right now other than taking care of kids. Restarted PT which has helped in the past with pain and limitations physically. Starting therapy with Peggye Macintosh end of  October. sTill taking vyvanse  40 and fluvoxamine  50 mg BID. Has missed some Vyvanse  bc some days sleeping late. More productive with Vyvanse  40 mg AM, but still procrastinates. Can read again better and sustain attention better but not as productive as she would like. Still a fair amount of anxiety easily triggered anxiety at times and hopes therapy will help.  Claustrophobic easily with panic triggered. Depression is OK lately.  02/16/22 appt noted: Had asthma episode and had to leave.  She was not in severe distress and was able to verbalize that she could drive herself home.  04/26/22 appt noted: Doing well.  Overall.  If gets up late then skips vyvanse  and then is sleepy.   Back and forth between [facility] and her house where temp is high for mother.  B at her house and always cold.  No HA with Stephanie's.   F 07-Apr-2022 died with dementia.  Declined quickly.  Didn't have to be placed.  Has been ok.  Gkids talk about him.  M doing pretty well and grateful he passed before being placed.   Eating healthy.   Asks whether needs to continue fluvoxamine .   Still on fluvoxamine  50 BID, Vyvanse  40 AM, trazdone 50 MG Hs Mood is good. Counseling with Bambi Cottle is already helpful. Still some anxiety but getting better recognizing triggers.  Can catastrophixize and working on CBT.  Still episodes out of the blue. Energy good with Vyvanse . Sleep is ok at D's. Plan no med changes:  07/27/2022 appointment noted: Psych meds fluvoxamine  50 mg twice daily, Vyvanse  50 mg every morning, L-methylfolate 15 mg daily, lorazepam  1 mg as needed panic,  Lyrica 50 mg twice daily.  Trazodone  50 mg nightly as needed insomnia. No SE.   Vyvanse  helps focus, binge eating .  Was out 5 days and overate.  More productive with it.  Better socialization with it.  Lost wt on it. Dose seems right . Sick with URI. Otherwise doing well.   D moved back [place] .  Had breakdown with conflict with D with thoughts of  wanting to escape but came out of it.  Therapist Bambi Cottle helped her.  Less often panic but has them at times.  Therapist helping her to recognize triggers.  Dep is under control.  Not desperate. Will have flareup with FM.  But has been active with gkids. When with kids sleeps well.   Plan no changes  10/25/22 appt noted: Meds as above & flexeril 10 mg HS.  Not often with lorazepam .   Pending cataract surgery in fall. Working in therapy to try to resolve issues that affect her body.  It is making her more self aware.  Hasn't started EMDR yet, but working on CBT.   F Apr 20, 2022 died with dementia. Wants to try methylated B6 and B12 to see if it helps. If rough mental health day then needs Ativan . Doing pool therapy.   Satisfied with Vyvanse  50 AM.  Has to take by 930 AM to keep from having insomnia.  Helps energy and motivation.  Still needs to work on productivity.    Still some migraine.  Migraine's will cause neuro Sx like word finding, visual. OCD sx been worse lately.  Cleanliness concerns in public.  Lulu to go to gym which produces anxiety.  Not always as bad.   Overall better on the whole but still bad days at times even with SI not often.   Still prefers to avoid going out, church, friends.  M's favorite child, B, has moved back home.   Creates some stress. Vyvanse  solved binge eating generally.  01/04/23 appt noted: More energy with B12 shots. In a bit of FM flare up.   In general doing well mood wise.   Not taking ADD med bc weird CP .  Going to card today to be checked out.  Vyvanse  50 is messing with sleep.  Wants to reduce to 40 mg AM Cut back fluvoxamine  to 50 mg without problems.  Maybe energy is better Lysle is good for her.   Usually sleeps with trazodone  50 and melatonin 3 mg .   Had panic at eye doctor over eye concerns.   Plan:  fluvoxamine  50 mg reduced daily,  Reduced Vyvanse  40 mg every morning DT insomnia,  L-methylfolate 15 mg daily, lorazepam  1 mg as  needed panic, Lyrica 50 mg twice daily.   Trazodone  50 mg nightly as needed insomnia.  03/08/23 appt noted: Meds as above Dad died a year ago.  Feels more depressed.  Spent Xmas wit D's family and enjoyed it.  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  Was busy there.  When got home slept most of 48 hours. Last couple of days took Vyvanse .  But interfering with sleep when not active.   At home no structure and will get more negative thoughts.   When on prednisone  feels better and more productive and better mood.  Son Will transitioned to [place] and living at home. She is more depressed and that drags her down.  4 people at home.    07/05/23 appt noted: Med: vyvanse  40 or Adderall XR 20, incr fluvox  75 for OCD Never had therapist like Bambi Cottle.  Really good. Would like diff stimulant bc ins and still some binge eating usually in the evening.   SE Vyvanse  ins Tried 40 and 50 worse ins. Adderall XR   20 doesn't help EDO as much but helps focus without insomnia.   Is alternating time. Plan no changes  10/11/23 appt noted:  Med: vyvanse  40 or Adderall XR 20, incr fluvox 75 for OCD, L-methylfolate, trazodone  50 mg HS Good therapy with Bambi Cottle.  Nobody helped her as much.   Predominantly Adderall vs vyvanse .  SE mouth movments.  Not taken in 2 weeks. Son Jacob also takes it. 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  At 10 mg XR.   She wants to reduce it.   Adderall helps conc and task completion and distractibility.  More acitve with it.  In general mental health is good.  Making progress in therapy.  Hopeful.  Reads.   Not sig dep. Elsie struggles with mental health issues.   Ongoing intermittent GI. Intermittent colitis.  And pending colonoscopy.   Dr. Loreli is good doc [facility] location.   Adderall helps conc and task completion and distractibility.  More acitve with it.   Past psych meds: Lithium, carbamazepine, oxcarbazepine, topiramate , valproic acid, lamotrigine , gabapentin    Lyrica ? effect Zyprexa, Seroquel, Latuda headaches, Geodon,  Risperidone, Stelazine,  Rexulti,  Wellbutrin with side effects,  sertraline irritability, duloxetine,  fluvoxamine  100, fluoxetine, Stopped desipramine DT skin issues which now resolved.  It helped GI px.  buspirone side effects,   N-acetylcysteine,  pramipexole,  Methylphenidate  NR,  Adderall,  Vyvanse  50 SE insomnia helps Ambien with amnesia, trazodone , Ativan   Review of Systems:  Review of Systems  HENT:  Negative for congestion.   Gastrointestinal:  Positive for abdominal pain. Negative for nausea.  Musculoskeletal:  Positive for back pain and myalgias.  Neurological:  Positive for headaches. Negative for tremors.  Psychiatric/Behavioral:  Positive for decreased concentration and dysphoric mood. Negative for behavioral problems. The patient is nervous/anxious.   Less HA with new meds.  Medications: I have reviewed the patient's current medications.  Current Outpatient Medications  Medication Sig Dispense Refill   albuterol  (PROVENTIL  HFA;VENTOLIN  HFA) 108 (90 Base) MCG/ACT inhaler Inhale 2 puffs into the lungs every 6 (six) hours as needed for wheezing or shortness of breath.     amphetamine -dextroamphetamine (ADDERALL XR) 20 MG 24 hr capsule Take 1 capsule (20 mg total) by mouth daily. 30 capsule 0   azelastine  (ASTELIN ) 0.1 % nasal spray Place 1-2 sprays into both nostrils 2 (two) times daily as needed (nasal drainage). Use in each nostril as directed 30 mL 3   Calcium  Carb-Cholecalciferol (CALCIUM  1000 + D) 1000-20 MG-MCG TABS Take by mouth.     cetirizine  (ZYRTEC ) 10 MG tablet Take 1 tablet (10 mg total) by mouth daily. 30 tablet 11   cyclobenzaprine (FLEXERIL) 10 MG tablet Take 10 mg by mouth at bedtime as needed.     diphenhydrAMINE  (BENADRYL ) 25 mg capsule Take 50 mg by mouth every 6 (six) hours as needed (HEADACHES).     EPINEPHrine  (EPIPEN  2-PAK) 0.3 mg/0.3 mL IJ SOAJ injection Inject 0.3 mg into the muscle  as needed for anaphylaxis. 0.3 mL 1   Erenumab-aooe 70 MG/ML SOAJ Inject into the skin every 28 (twenty-eight) days.     fluticasone  (FLONASE ) 50 MCG/ACT nasal spray Place 1-2 sprays into both nostrils daily as needed (nasal congestion). 16 g 3  fluvoxaMINE  (LUVOX ) 25 MG tablet Take 3 tablets (75 mg total) by mouth at bedtime. 225 tablet 1   hydrocortisone (ANUSOL-HC) 25 MG suppository 1 suppository     ibuprofen  (ADVIL ) 800 MG tablet 1 tablet with food or milk as needed     L-Methylfolate 15 MG TABS TAKE 1 TABLET BY MOUTH DAILY 30 tablet 11   LORazepam  (ATIVAN ) 1 MG tablet TAKE 1 TABLET BY MOUTH EVERY 8 HOURS AS NEEDED FOR ANXIETY( TAKE 2 MG WHEN HAVING A MEDICAL PROCEDURE) 30 tablet 0   NURTEC 75 MG TBDP Take 1 tablet by mouth daily as needed.     Olopatadine  HCl 0.2 % SOLN Apply 1 drop to eye daily as needed (itchy/watery eyes). 2.5 mL 5   oxyCODONE -acetaminophen  (PERCOCET/ROXICET) 5-325 MG per tablet Take 2 tablets by mouth daily as needed for moderate pain or severe pain (mighraine.).      polyethylene glycol (MIRALAX  / GLYCOLAX ) packet Take 17 g by mouth daily as needed for mild constipation.     PROLIA 60 MG/ML SOSY injection BRING TO THE OFFICE FOR INJECTION ON 02/15/2018 AS DIRECTED ONCE EVERY 6 MONTHS     promethazine  (PHENERGAN ) 25 MG tablet Take 25 mg by mouth every 6 (six) hours as needed for nausea.     traZODone  (DESYREL ) 50 MG tablet TAKE 1 TABLET(50 MG) BY MOUTH AT BEDTIME 30 tablet 0   VASCEPA 1 g CAPS TK 2 CS PO BID WC  3   VOLTAREN 1 % GEL Apply 2 g topically 4 (four) times daily as needed (JOINT PAIN).   3   rosuvastatin  (CRESTOR ) 10 MG tablet Take 1 tablet (10 mg total) by mouth daily. 90 tablet 3   No current facility-administered medications for this visit.    Medication Side Effects: None  Allergies:  Allergies  Allergen Reactions   Sulfa Antibiotics Nausea And Vomiting    Causes pt to vomit blood Other reaction(s): vomiting blood   Gluten Meal Other (See  Comments)    Sensitivity.    Lactose Intolerance (Gi) Nausea And Vomiting   Aspirin     Other reaction(s): colitis   Bromfed Dm [Pseudoeph-Bromphen-Dm]     Other reaction(s): strung out and hallucinating   Chlorpromazine     Other reaction(s): Dark thoughts   Desipramine     Other reaction(s): rash   Gabapentin      Other reaction(s): swelling / fuzzy vision   Hydrocodone      Other reaction(s): causes hangovers   Lamotrigine      Other reaction(s): rash   Metronidazole     Other reaction(s): vomiting blood   Morphine Sulfate     Other reaction(s): tearful   Nsaids     Other reaction(s): colitis   Flagyl [Metronidazole Hcl] Nausea And Vomiting   Morphine And Codeine  Other (See Comments)    Reaction: Depression, emotional    Past Medical History:  Diagnosis Date   Abdominal pain, unspecified site 10/18/2012   Anemia    Anxiety    Arthritis    osteoarthritis   ASCUS (atypical squamous cells of undetermined significance) on Pap smear 05/06/2005   NEG HIGH RISK HPV--C&B BIOPSY BENIGN 12/2005   Asthma    Bipolar 2 disorder (HCC)    Cancer (HCC)    skin cancer - basal cell   Colon polyps    Complication of anesthesia    anxious afterwards, will get headaches    Constipation    Depression    Fibromyalgia 10/2013   GERD (gastroesophageal reflux  disease)    Hearing loss on left    Heart murmur    never had any problems   Hemorrhoids    High cholesterol    High risk HPV infection 08/2011   cytology negative   IBS (irritable bowel syndrome)    Insomnia    Lymphocytic colitis    Lymphoma (HCC)    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   Bone marrow biopsy showes 8% plasma cells IgA Lambda   Migraines    Nausea alone 10/18/2012   Osteoarthritis    Osteopenia    Peripheral neuropathy    PTSD (post-traumatic stress disorder)     Family History  Problem Relation Age of Onset   Allergic rhinitis Mother    Allergic rhinitis Father    Diabetes  Father    Hypertension Father    Hyperlipidemia Father    Allergic rhinitis Sister    Allergic rhinitis Brother    Allergic rhinitis Brother    Allergic rhinitis Maternal Aunt    Allergic rhinitis Maternal Uncle    Allergic rhinitis Paternal Aunt    Allergic rhinitis Paternal Uncle    Allergic rhinitis Maternal Grandmother    Allergic rhinitis Maternal Grandfather    Heart disease Maternal Grandfather    Allergic rhinitis Paternal Grandmother    Heart disease Paternal Grandmother    Allergic rhinitis Paternal Grandfather    Heart disease Paternal Grandfather    Allergic rhinitis Daughter    Depression Daughter    Anxiety disorder Daughter    Asthma Daughter    Atopy Son    Eczema Son    Allergic rhinitis Son    Depression Son    Atopy Son    Allergic rhinitis Son    Depression Son    Anxiety disorder Son    Breast cancer Neg Hx     Social History   Socioeconomic History   Marital status: Divorced    Spouse name: Not on file   Number of children: 3   Years of education: Not on file   Highest education level: Not on file  Occupational History   Not on file  Tobacco Use   Smoking status: Former    Current packs/day: 0.00    Types: Cigarettes    Quit date: 05/13/2014    Years since quitting: 9.4   Smokeless tobacco: Never   Tobacco comments:    social   Vaping Use   Vaping status: Never Used  Substance and Sexual Activity   Alcohol use: Yes    Alcohol/week: 0.0 standard drinks of alcohol    Comment: rare   Drug use: Never   Sexual activity: Not Currently    Comment: -1st intercourse 63 yo-More than 5 partners  Other Topics Concern   Not on file  Social History Narrative   Not on file   Social Drivers of Health   Financial Resource Strain: Not on file  Food Insecurity: Low Risk  (01/17/2023)   Received from [facility]   Hunger Vital Sign    Within the past 12 months, you worried that your food would run out before you got money to buy more: Never  true    Within the past 12 months, the food you bought just didn't last and you didn't have money to get more. : Never true  Transportation Needs: No Transportation Needs (01/17/2023)   Received from [facility]    In the past 12 months, has lack of reliable transportation  kept you from medical appointments, meetings, work or from getting things needed for daily living? : No  Physical Activity: Not on file  Stress: Not on file  Social Connections: Unknown (05/11/2022)   Received from [facility]   Social Network    Social Network: Not on file  Intimate Partner Violence: Unknown (05/11/2022)   Received from [facility]   HITS    Physically Hurt: Not on file    Insult or Talk Down To: Not on file    Threaten Physical Harm: Not on file    Scream or Curse: Not on file    Past Medical History, Surgical history, Social history, and Family history were reviewed and updated as appropriate.   Please see review of systems for further details on the patient's review from today.   Objective:   Physical Exam:  LMP 12/28/2010   Physical Exam Constitutional:      General: She is not in acute distress. Musculoskeletal:        General: No deformity.  Neurological:     Mental Status: She is alert and oriented to person, place, and time.     Coordination: Coordination normal.  Psychiatric:        Attention and Perception: Attention and perception normal. She does not perceive auditory or visual hallucinations.        Mood and Affect: Mood is anxious and depressed. Affect is not labile, angry or tearful.        Speech: Speech normal.        Behavior: Behavior normal.        Thought Content: Thought content normal. Thought content is not paranoid or delusional. Thought content does not include homicidal or suicidal ideation. Thought content does not include suicidal plan.        Cognition and Memory: Cognition and memory normal.        Judgment: Judgment normal.      Comments: Insight intact Depression worse again       Lab Review:     Component Value Date/Time   NA 140 01/10/2023 1125   NA 144 07/12/2015 1521   K 4.5 01/10/2023 1125   K 3.8 07/12/2015 1521   CL 103 01/10/2023 1125   CO2 24 01/10/2023 1125   CO2 27 07/12/2015 1521   GLUCOSE 93 01/10/2023 1125   GLUCOSE 98 10/30/2022 1702   GLUCOSE 82 07/12/2015 1521   BUN 16 01/10/2023 1125   BUN 10.3 07/12/2015 1521   CREATININE 0.61 01/10/2023 1125   CREATININE 0.87 08/01/2016 1713   CREATININE 0.8 07/12/2015 1521   CALCIUM  9.4 01/10/2023 1125   CALCIUM  9.9 07/12/2015 1521   PROT 7.4 08/01/2016 1713   PROT 7.4 07/12/2015 1521   ALBUMIN 4.8 08/01/2016 1713   ALBUMIN 4.4 07/12/2015 1521   AST 23 08/01/2016 1713   AST 22 07/12/2015 1521   ALT 26 08/01/2016 1713   ALT 31 07/12/2015 1521   ALKPHOS 55 08/01/2016 1713   ALKPHOS 54 07/12/2015 1521   BILITOT 0.7 08/01/2016 1713   BILITOT 0.40 07/12/2015 1521   GFRNONAA >60 10/30/2022 1702   GFRNONAA 76 08/01/2016 1713   GFRAA 88 08/01/2016 1713       Component Value Date/Time   WBC 5.6 10/30/2022 1702   RBC 4.65 10/30/2022 1702   HGB 14.2 10/30/2022 1702   HGB 14.1 07/12/2015 1521   HCT 41.3 10/30/2022 1702   HCT 41.5 07/12/2015 1521   PLT 247 10/30/2022 1702   PLT 248 07/12/2015  1521   MCV 88.8 10/30/2022 1702   MCV 92.2 07/12/2015 1521   MCH 30.5 10/30/2022 1702   MCHC 34.4 10/30/2022 1702   RDW 12.0 10/30/2022 1702   RDW 13.3 07/12/2015 1521   LYMPHSABS 2,006 08/01/2016 1713   LYMPHSABS 1.8 07/12/2015 1521   MONOABS 354 08/01/2016 1713   MONOABS 0.7 07/12/2015 1521   EOSABS 118 08/01/2016 1713   EOSABS 0.1 07/12/2015 1521   BASOSABS 0 08/01/2016 1713   BASOSABS 0.0 07/12/2015 1521    No results found for: POCLITH, LITHIUM   No results found for: PHENYTOIN, PHENOBARB, VALPROATE, CBMZ   Genesight:  2D6 poor,  SLC6A4 short/short MTHFR homozygous   .res Assessment: Plan:    Ben Sanz was  seen today for follow-up, depression, anxiety, fatigue and add.  Diagnoses and all orders for this visit:  PTSD (post-traumatic stress disorder)  Attention deficit hyperactivity disorder (ADHD), combined type  Mixed obsessional thoughts and acts  Major depressive disorder, recurrent episode, moderate (HCC)  Generalized anxiety disorder  Fibromyalgia  Panic disorder with agoraphobia  Insomnia due to mental condition   Past couple of years more obvious PTSD obvious has called into doubt the bipolar dix bc she's remained mood stable off the lamotrigine .    She has had symptoms consistent with hypomania in the past but there is now some question as to whether they were PTSD related rather than truly bipolar 2.  Her son has had treatment resistant depression with possible bipolar disorder and her daughter has been diagnosed with bipolar disorder but also a history of borderline personality disorder.  So there is a family history of the spectrum.  Better with grief.     Prior benefit fluvoxamine  for anxiety and mood disorders.  No SE Continue  increase fluvoxamine  75 mg daily for dep and OCD  Clear benefit from fluvoxamine  for mood quite significantly. No manic sx. Consider weaning when therapy has helped a little more and further progress.  HA are better 2-4/month.  Still has them at times.    Disc sleep hygiene and temperature.     discussed the short-term risks associated with benzodiazepines including sedation and increased fall risk among others.  Discussed long-term side effect risk including dependence, potential withdrawal symptoms, and the potential eventual dose-related risk of dementia.  But recent studies from 2020 dispute this association between benzodiazepines and dementia risk. Newer studies in 2020 do not support an association with dementia.   Keep this at a minimum given cognitive concerns.  She only uses it when triggered with anxiety usually medical.  She is not using  it regularly. Continue Ativan  prn 1 mg for panic and triggers.  Able to use much less since therapy  Discussed potential benefits, risks, and side effects of stimulants with patient to include increased heart rate, palpitations, insomnia, increased anxiety, increased irritability, or decreased appetite.  Instructed patient to contact office if experiencing any significant tolerability issues.  Switch stimulant to Adderall XR 10 mg AM.  Bc twitches on higher dose May also help mood and weight loss should help pain.  Has been helpful.   Disc timing and options.   Caution it could cause anxiety and other SE  Therapy helping emotional management. Disc jury letter for excuse to PTSD.   Still gets triggered some.    trial methylated  B12.  L-methylfolate 15 mg daily, lorazepam  1 mg as needed panic,  stopped DT wt gain Lyrica 50 mg twice daily.   Trazodone  50 mg nightly  as needed insomnia.  FU 4 mos  Lorene Macintosh, MD, DFAPA    Please see After Visit Summary for patient specific instructions.  Future Appointments  Date Time Provider Department Center  10/15/2023  3:30 PM Sophie Burnard HERO, PT [facility] None  10/16/2023  3:00 PM Cottle, Bambi G, LCSW [facility] None  10/17/2023 10:15 AM Judythe Delon CROME, PTA [facility] None  10/22/2023 10:15 AM Sophie Burnard HERO, PT [facility] None  10/24/2023 11:00 AM Cottle, Peggye MATSU, LCSW [facility] None  10/26/2023  3:15 PM Judythe Delon CROME, PTA [facility] None  10/31/2023 10:15 AM Sophie Burnard HERO, PT [facility] None  11/02/2023  3:15 PM Judythe Delon CROME, PTA [facility] None  11/06/2023  3:00 PM Cottle, Bambi G, LCSW [facility] None  11/07/2023 10:15 AM Sophie Burnard HERO, PT [facility] None  11/12/2023 10:15 AM Sophie Burnard HERO, PT [facility] None  11/13/2023  3:00 PM Cottle, Bambi G, LCSW [facility] None  11/14/2023 10:15 AM Sophie Burnard HERO, PT [facility] None  11/16/2023  9:30 AM Leroux-Martinez, Rosaline Jansky, AUD [facility] None  11/16/2023 10:00 AM Karis Clunes, MD [facility] None   11/20/2023  3:00 PM Cottle, Peggye MATSU, LCSW [facility] None  11/27/2023  3:00 PM Cottle, Bambi G, LCSW [facility] None  12/04/2023  3:00 PM Cottle, Bambi G, LCSW [facility] None  12/11/2023  3:00 PM Cottle, Bambi G, LCSW [facility] None  12/18/2023  3:00 PM Cottle, Bambi G, LCSW [facility] None  12/25/2023  3:00 PM Cottle, Bambi G, LCSW [facility] None    No orders of the defined types were placed in this encounter.      -------------------------------

## 2023-10-12 DIAGNOSIS — F411 Generalized anxiety disorder: Secondary | ICD-10-CM | POA: Diagnosis not present

## 2023-10-12 DIAGNOSIS — Z23 Encounter for immunization: Secondary | ICD-10-CM | POA: Diagnosis not present

## 2023-10-12 DIAGNOSIS — K589 Irritable bowel syndrome without diarrhea: Secondary | ICD-10-CM | POA: Diagnosis not present

## 2023-10-12 DIAGNOSIS — F4310 Post-traumatic stress disorder, unspecified: Secondary | ICD-10-CM | POA: Diagnosis not present

## 2023-10-12 DIAGNOSIS — G43909 Migraine, unspecified, not intractable, without status migrainosus: Secondary | ICD-10-CM | POA: Diagnosis not present

## 2023-10-12 DIAGNOSIS — M797 Fibromyalgia: Secondary | ICD-10-CM | POA: Diagnosis not present

## 2023-10-12 DIAGNOSIS — D472 Monoclonal gammopathy: Secondary | ICD-10-CM | POA: Diagnosis not present

## 2023-10-15 ENCOUNTER — Ambulatory Visit

## 2023-10-15 DIAGNOSIS — M6281 Muscle weakness (generalized): Secondary | ICD-10-CM

## 2023-10-15 DIAGNOSIS — R293 Abnormal posture: Secondary | ICD-10-CM

## 2023-10-15 DIAGNOSIS — M797 Fibromyalgia: Secondary | ICD-10-CM | POA: Diagnosis not present

## 2023-10-15 DIAGNOSIS — R252 Cramp and spasm: Secondary | ICD-10-CM

## 2023-10-15 DIAGNOSIS — M542 Cervicalgia: Secondary | ICD-10-CM

## 2023-10-15 DIAGNOSIS — M353 Polymyalgia rheumatica: Secondary | ICD-10-CM | POA: Diagnosis not present

## 2023-10-15 NOTE — Therapy (Signed)
 OUTPATIENT PHYSICAL THERAPY TREATMENT     Patient Name: Danielle Bruce MRN: [id number] DOB:12/20/1960, 63 y.o., female Today's Date: 10/15/2023  PT End of Session - 10/15/23 1618     Visit Number 4    Date for PT Re-Evaluation 11/14/23    Authorization Type Medicare-KX needed    Progress Note Due on Visit 10    PT Start Time 1532    PT Stop Time 1617    PT Time Calculation (min) 45 min    Activity Tolerance Patient tolerated treatment well    Behavior During Therapy [facility] for tasks assessed/performed                  Past Medical History:  Diagnosis Date   Abdominal pain, unspecified site 10/18/2012   Anemia    Anxiety    Arthritis    osteoarthritis   ASCUS (atypical squamous cells of undetermined significance) on Pap smear 05/06/2005   NEG HIGH RISK HPV--C&B BIOPSY BENIGN 12/2005   Asthma    Bipolar 2 disorder (HCC)    Cancer (HCC)    skin cancer - basal cell   Colon polyps    Complication of anesthesia    anxious afterwards, will get headaches    Constipation    Depression    Fibromyalgia 10/2013   GERD (gastroesophageal reflux disease)    Hearing loss on left    Heart murmur    never had any problems   Hemorrhoids    High cholesterol    High risk HPV infection 08/2011   cytology negative   IBS (irritable bowel syndrome)    Insomnia    Lymphocytic colitis    Lymphoma (HCC)    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 Cornett, MD;   Location: [facility];  Service: General;  Laterality: N/A;   COLONOSCOPY     numerous times   DILATION AND CURETTAGE OF UTERUS     ESOPHAGOGASTRODUODENOSCOPY     HEMORRHOID SURGERY  1993   x3   IUD REMOVAL  02/2015   Mirena   LAPAROSCOPIC LYSIS OF ADHESIONS N/A 10/16/2012   Procedure: LAPAROSCOPIC LYSIS OF ADHESIONS;  Surgeon: Debby A. Cornett, MD;  Location: [facility];  Service: General;  Laterality: N/A;   LAPAROSCOPY N/A 10/16/2012   Procedure: LAPAROSCOPY DIAGNOSTIC;  Surgeon: Debby LABOR. Cornett, MD;  Location: [facility];  Service: General;  Laterality: N/A;   PELVIC LAPAROSCOPY     RADIOLOGY WITH ANESTHESIA N/A 12/16/2015   Procedure: MRI OF BRAIN WITH AND WITHOUT CONTRAST;  Surgeon: Medication Radiologist, MD;  Location: [facility];  Service: Radiology;  Laterality: N/A;   SHOULDER SURGERY  2007/2008   SPINE SURGERY  2010   cervical   Patient Active Problem List   Diagnosis Date Noted   GAD (generalized anxiety disorder) 12/26/2017   OCD (obsessive compulsive disorder) 12/26/2017   PTSD (post-traumatic stress disorder) 12/26/2017   DDD (degenerative disc disease), cervical 07/14/2016  Primary osteoarthritis of both feet 07/14/2016   Primary osteoarthritis of both hands 07/14/2016   Other fatigue 07/14/2016   History of IBS 07/14/2016   Osteopenia of multiple sites 07/14/2016   Fibromyalgia 01/13/2016   MGUS (monoclonal gammopathy of unknown significance) 12/23/2013   Chronic cholecystitis without calculus 10/18/2012   Abdominal pain 10/18/2012   Nausea alone 10/18/2012   Constipation 10/18/2012   Depression 10/18/2012   Anxiety    ASCUS (atypical squamous cells of undetermined significance) on Pap smear    IUD    Hemorrhoids 11/29/2010   Abdominal pain, left upper quadrant 11/29/2010    PCP:  Joen Gentry, MD  REFERRING PROVIDER: Joen Gentry, MD  REFERRING DIAG: fibromyalgia  THERAPY DIAG:  Abnormal posture  Cramp and spasm  Muscle weakness  (generalized)  Cervicalgia  Polymyalgia rheumatica (HCC)  Rationale for Evaluation and Treatment Rehabilitation  ONSET DATE: chronic pain (fibromyalgia), bowel flare up 1.5 months ago  SUBJECTIVE:                                                                                                                                                                                                         SUBJECTIVE STATEMENT: I had a headache yesterday so I was in bed all day because of that.  Overall, I'm feeling ok.  My head is trying to start hurting.  It was good to get back to the pool.  The hot tub jets help me.     PERTINENT HISTORY:  Anxiety, bipolar disorder, depression, fibromyalgia, IBS, migraines, osteopenia, PTSD  PAIN: 10/15/23 Are you having pain: Not much, just some random soreness Pain location: Rt upper trap/neck, Rt>Lt jaw  Pain rating: 4/10 Pain description: irritating, annoying   Aggravating factors: doing too much, stress  Relieving factors: muscle relaxers, rest, stretching  PRECAUTIONS: Other: chronic pain syndrome and depression Other: slow progression with exercise due to chronic condition  WEIGHT BEARING RESTRICTIONS No  FALLS:  Has patient fallen in last 6 months? No  LIVING ENVIRONMENT: Lives with: lives with their family Lives in: House/apartment  OCCUPATION: on disability  PLOF: Independent with basic ADLs and Leisure: yardwork, walking  Pt cares for her 4 young grandchildren- difficulty with care including lifting and carrying  PATIENT GOALS be more active with less pain, exercise at the gym regularly, lift and carry grandchildren and laundry, improved ease with yardwork and housework, resume exercise  OBJECTIVE:   DIAGNOSTIC FINDINGS: none recent   PATIENT SURVEYS:  The Patient-Specific Functional Scale  Initial:  I am going to ask you to identify up  to 3 important activities that you are unable to do or are having difficulty with as a result  of this problem.  Today are there any activities that you are unable to do or having difficulty with because of this?  (Patient shown scale and patient rated each activity)  Follow up: When you first came in you had difficulty performing these activities.  Today do you still have difficulty?  Patient-Specific activity scoring scheme (Point to one number):  0 1 2 3 4 5 6 7 8 9  10 Unable                                                                                                          Able to perform To perform                                                                                                    activity at the same Activity         Level as before                                                                                                                       Injury or problem  Activity   Lifting laundry and groceries/housework                           initial:  3/10  2.    Yard work- up and down with squatting          Initial: 2/10     COGNITION: Overall cognitive status: Within functional limits for tasks assessed  SENSATION: WFL  POSTURE: rounded shoulders and forward head  PALPATION: Diffuse palpable tenderness over bil neck, upper traps, thoracic, lumbar and gluteals with trigger points.    CERVICAL ROM:   Stiff at end range and pain on Rt with Lt motions.  All WFLs  UPPER EXTREMITY ROM: UE A/ROM is limited by 20% into flexion and abduction with pain at end range.  Hip flexibility is limited by 25% in all directions with pain in all directions.  UPPER EXTREMITY MMT: UE:  4+/5, LE 4+/5 except hip flexors 4-/5  TODAY'S TREATMENT:   10/15/23 Nustep level 3 for 8 min- PT monitored and discussed pt status Melt Method on foam roll- head nods, cervical rotation with pressure, lateral nods and circles on each side Ball roll outs 3 ways x10 each  Seated on ball: 3 way raises with 1# weight x10 each  Manual: elongation and release to suboccipital  release and glide to temporalis and frontalis  10/10/23; Pt arrives for aquatic physical therapy. Treatment took place in 3.5-5.5 feet of water. Water temperature was 91 degrees F. Pt entered the pool via stairs independently. Pt requires buoyancy of water for support and to offload joints with strengthening exercises.  Pt utilizes viscosity of the water required for strengthening. Seated water bench with 75% submersion Pt performed seated LE AROM exercises 20x in all planes for initial warm up and review of todays status. 75%-50% water depth for the following: water walking with UE push/pull with rainbow wts 6x each direction, hip kicks Bil 10x (VC to use a medium resistance today) high knee marching with single buoy floats by herside 4 lengths. Low back stretch holding onto rails at the ladder/feet in the wall ladder: VC to let her tailbone be heavy: 3x 3 -4 breaths. Horseback bicycle 6 min on yellow noodle. End session with 2 min seated decompression using yellow noodle.  10/08/23 Nustep level 3 for 8 min- PT monitored and discussed pt status Seated hamstring stretch 2x20sec  Standing quad stretch 2x20 sec  Side lying open books with foam roller propped under top leg 1x15 each side Cat/Cow in quadruped position 1x10 each side Side lying clam shells 1x15 each side Manual therapy: STM to paraspinals, bil upper traps, Rotator cuff muscles-to decrease muscle tension for pain reduction and increased functional capacity   HOME EXERCISE PROGRAM:  Access Code: [email or web address]- previously provided  Access Code: [email or web address] URL: [email or web address] Date: 09/19/2023 Prepared by: Burnard  Exercises - Seated Cervical Flexion AROM  - 3 x daily - 7 x weekly - 1 sets - 3 reps - 20 hold - Seated Cervical Sidebending AROM  - 3 x daily - 7 x weekly - 1 sets - 3 reps - 20 hold - Seated Cervical Rotation AROM  - 3 x daily - 7 x weekly - 1 sets - 3 reps - 20 hold - Seated Correct Posture  - 1 x daily - 7  x weekly - 3 sets - 10 reps - Seated Diaphragmatic Breathing  - 5 x daily - 7 x weekly - 1 sets - 10 reps - Sidelying Open Book Thoracic Rotation with Knee on Foam Roll  - 2 x daily - 7 x weekly - 3 sets - 10 reps - Seated Transversus Abdominis Bracing  - 5 x daily - 7 x weekly - 1 sets - 10 reps - 5 hold - Seated Hamstring Stretch  - 3 x daily - 7 x weekly - 1 sets - 3 reps - 20 hold ASSESSMENT:  CLINICAL IMPRESSION: Pt did well with aquatic exercise last week.  She is consistent with her HEP and is more consistent with movement overall.  Pt tolerated all exercise in the clinic without increased pain and required tactile cues for reduced upper trap activation.  PT reviewed MELT method with patient so she can use for cervical tension at home.  She responded well to manual therapy to release tension at suboccipitals, temporalis and frontalis and had active trigger points.  Patient will benefit from skilled  PT to address the below impairments and improve overall function.     OBJECTIVE IMPAIRMENTS decreased activity tolerance, decreased endurance, difficulty walking, decreased strength, increased muscle spasms, impaired flexibility, improper body mechanics, postural dysfunction, and pain.   ACTIVITY LIMITATIONS carrying, lifting, sitting, standing, reach over head, hygiene/grooming, and locomotion level  PARTICIPATION LIMITATIONS: meal prep, cleaning, laundry, driving, community activity, and yard work  PERSONAL FACTORS Past/current experiences, Time since onset of injury/illness/exacerbation, and 3+ comorbidities: fibromyalgia depression, anxiety,  are also affecting patient's functional outcome.   REHAB POTENTIAL: Good  CLINICAL DECISION MAKING: Evolving/moderate complexity  EVALUATION COMPLEXITY: Moderate   GOALS: Goals reviewed with patient? Yes  SHORT TERM GOALS: Target date: 10/17/2023    Return to regular performance of HEP 4-5x/week to improve mobility  Baseline: has not been  exercising regularly due to GI pain (09/19/23) Goal status: NEW  2.  Rate housework > or = to 4/10 on patient specific functional scale (PSFS) Baseline: 3/10 Goal status: Ongoing  3.  Verbalize and demonstrate abdominal massage daily to reduce GI pain Baseline:  Goal status: Met 8/11    LONG TERM GOALS: Target date: 11/14/2023    Be independent in advanced HEP Baseline:  Goal status:  Ongoing  2.  Rate squatting for yard work > or = to 4/10 on PSFS Baseline: 2/10 Goal status:NEW  3.  Report > or = to 20% reduction in neck and thoracic pain with daily tasks  Baseline: Goal status: NEW  4.  Walk for exercise and perform regular exercises 3-5x/wk for exercise to improve endurance  Baseline:  Goal status: NEW  5.  Rate housework > or = to 5/10 on PSFS  Baseline: 3/10   Goal status:NEW    PLAN: PT FREQUENCY: 1-2x/week  PT DURATION: 8 weeks  PLANNED INTERVENTIONS: 97164- PT Re-evaluation, 97110-Therapeutic exercises, 97530- Therapeutic activity, 97112- Neuromuscular re-education, 97535- Self Care, [id number]- Manual therapy, [phone]- Gait training, [phone]- Aquatic Therapy, [phone]- Electrical stimulation (unattended), [phone]- Electrical stimulation (manual), [id number]- Traction (mechanical), 20560 (1-2 muscles), 20561 (3+ muscles)- Dry Needling, Patient/Family education, Balance training, Taping, Joint mobilization, Spinal manipulation, Spinal mobilization, Cryotherapy, Moist heat, Therapeutic exercises, Therapeutic activity, Neuromuscular re-education, and Self Care  PLAN FOR NEXT SESSION:  Work to get pt consistent with HEP, pool PT to address chronic fatigue and abdominal symptoms, pt will consider dry needling in the future to address muscle tension   Burnard Joy, PT 10/15/23 4:18 PM

## 2023-10-16 ENCOUNTER — Telehealth: Payer: Self-pay | Admitting: Psychiatry

## 2023-10-16 ENCOUNTER — Ambulatory Visit (INDEPENDENT_AMBULATORY_CARE_PROVIDER_SITE_OTHER): Admitting: Psychology

## 2023-10-16 DIAGNOSIS — Z0289 Encounter for other administrative examinations: Secondary | ICD-10-CM

## 2023-10-16 DIAGNOSIS — F902 Attention-deficit hyperactivity disorder, combined type: Secondary | ICD-10-CM

## 2023-10-16 DIAGNOSIS — F422 Mixed obsessional thoughts and acts: Secondary | ICD-10-CM

## 2023-10-16 DIAGNOSIS — F4310 Post-traumatic stress disorder, unspecified: Secondary | ICD-10-CM | POA: Diagnosis not present

## 2023-10-16 NOTE — Telephone Encounter (Signed)
 Pt has sent jury summons and requests excuse for jury duty. Cottle agreed and letter is in process.

## 2023-10-17 ENCOUNTER — Ambulatory Visit: Admitting: Physical Therapy

## 2023-10-17 ENCOUNTER — Telehealth: Payer: Self-pay | Admitting: Psychiatry

## 2023-10-17 NOTE — Telephone Encounter (Signed)
 Called pt. Advised jury duty letter completed but form has info pt has to complete. Pt will pick up.

## 2023-10-17 NOTE — Progress Notes (Signed)
 [facility] Counselor/Therapist Progress Note  Patient ID: Danielle Bruce, MRN: [id number],    Date:10/16/2023  Time Spent: 57 minutes Time In: 3:03  Time out: 4:00  Treatment Type: Individual Therapy  Reported Symptoms: flashbacks, responding to triggers, depression, anxiety, being busy all of the time, dissociation  Mental Status Exam: Appearance:  Casual     Behavior: Appropriate  Motor: normal  Speech/Language:  Clear and Coherent  Affect: Blunt  Mood: pleasant  Thought process: normal  Thought content:   WNL  Sensory/Perceptual disturbances:   WNL  Orientation: oriented to person, place, time/date, and situation  Attention: Good  Concentration: Good  Memory: WNL  Fund of knowledge:  Good  Insight:   Good  Judgment:  Fair  Impulse Control: Good   Risk Assessment: Danger to Self:  No Self-injurious Behavior: No Danger to Others: No Duty to Warn:no Physical Aggression / Violence:No  Access to Firearms a concern: No  Gang Involvement:No   Subjective: The patient attended a face-to-face individual therapy in the office today.  The patient presents as pleasant and cooperative.  The patient talked about being frustrated with her mother and we processed a situation that had happened today with her mother where her mother basically again made it seem as if she is the caregiver of her mentally ill daughter.  We talked about the dynamics of what is going on with her mother and the need for her to make her disabled in some way so that she can present as being such a caring mother to take care of her sick daughter.  I encouraged the patient to understand where her mother is coming from and to understand that in some ways she is probably healthier than her mother at this point.  We talked about how to reframe her thoughts in her mind.  Interventions: Cognitive Behavioral Therapy, Mindfulness Meditation, Eye Movement Desensitization and Reprocessing (EMDR),  Insight-Oriented, and Interpersonal, psychoeducation  Diagnosis:PTSD (post-traumatic stress disorder)  Attention deficit hyperactivity disorder (ADHD), combined type  Mixed obsessional thoughts and acts  Plan: Plan of Care: Client Abilities/Strengths  Insightful, motivated, Supportive family Client Treatment Preferences  Outpatient Individual therapy/EMDR  Client Statement of Needs  I think I have PTSD and I feel like I need another kind of therapy than talk therapy Treatment Level  Outpatient Individual therapy  Symptoms  Demonstrates an exaggerated startle response.:  (Status: maintained). Depressed  or irritable mood.: (Status:improved). Describes a reliving of the event,  particularly through dissociative flashbacks.: (Status: maintained). Displays a  significant decline in interest and engagement in activities.:  (Status: improved). Displays significant psychological and/or physiological distress resulting from internal and external  clues that are reminiscent of the traumatic event.: (Status: maintained).  Experiences disturbances in sleep.:  (Status: improved). Experiences disturbing  and persistent thoughts, images, and/or perceptions of the traumatic event.:  (Status: maintained). Experiences frequent nightmares.: (Status: maintained).  Feelings of hopelessness, worthlessness, or inappropriate guilt.: No Description Entered (Status:  improved). Has been exposed to a traumatic event involving actual or perceived threat of death or  serious injury.:  (Status: maintained). Impairment in social, occupational, or  other areas of functioning.:(Status: maintained). Intentionally avoids activities,  places, people, or objects (e.g., up-armored vehicles) that evoke memories of the event.: (Status: maintained). Intentionally avoids thoughts, feelings, or discussions related  to the traumatic event.: (Status: maintained). Reports difficulty concentrating as  well as feelings of guilt  (Status:maintained). Reports response of intense fear,  helplessness, or horror to the traumatic event.:  (  Status: maintained).  Problems Addressed  Unipolar Depression, Posttraumatic Stress Disorder (PTSD), Posttraumatic Stress Disorder (PTSD),   Posttraumatic Stress Disorder (PTSD), Posttraumatic Stress Disorder (PTSD)  Goals 1. Develop healthy thinking patterns and beliefs about self, others, and the world that lead to the alleviation and help prevent the relapse of  depression. Objective Identify and replace thoughts and beliefs that support depression. Target Date: 2024/01/04 Frequency: Weekly Progress: 60 Modality: individual Related Interventions 1. Explore and restructure underlying assumptions and beliefs reflected in biased self-talk that  may put the client at risk for relapse or recurrence. 2. Conduct Cognitive-Behavioral Therapy (see Cognitive Behavior Therapy by Almarie; Overcoming Depression by Marine dunker al.), beginning with helping the client learn the connection among  cognition, depressive feelings, and actions. 2. Eliminate or reduce the negative impact trauma related symptoms have  on social, occupational, and family functioning. Objective Learn and implement personal skills to manage challenging situations related to trauma. Target Date: 2024/01/04 Frequency: Weekly Progress: 20 Modality: individual 3. No longer avoids persons, places, activities, and objects that are  reminiscent of the traumatic event. Objective Participate in Eye Movement Desensitization and Reprocessing (EMDR) to reduce emotional distress  related to traumatic thoughts, feelings, and images. Target Date: 2024/01/04 Frequency: Weekly Progress: 0 Modality: individual   Related Interventions 1. Utilize Eye Movement Desensitization and Reprocessing (EMDR) to reduce the client's  emotional reactivity to the traumatic event and reduce PTSD symptoms. Objective Learn and implement guided  self-dialogue to manage thoughts, feelings, and urges brought on by  encounters with trauma-related situations. Target Date: 2025-11/07 Frequency: Weekly Progress: 20 Modality: individual Related Interventions 1. Teach the client a guided self-dialogue procedure in which he/she learns to recognize  maladaptive self-talk, challenges its biases, copes with engendered feelings, overcomes  avoidance, and reinforces his/her accomplishments; review and reinforce progress, problemsolve obstacles. 4. No longer experiences intrusive event recollections, avoidance of event  reminders, intense arousal, or disinterest in activities or  relationships. 5. Thinks about or openly discusses the traumatic event with others  without experiencing psychological or physiological distress. Diagnosis Axis  none F43.10 (Posttraumatic stress disorder) - Open - [Signifier: n/a] Posttraumatic Stress  Disorder  Conditions For Discharge Achievement of treatment goals and objectives   Aariyana Manz G Lawrnce Reyez, LCSW

## 2023-10-18 ENCOUNTER — Other Ambulatory Visit: Payer: Self-pay | Admitting: Psychiatry

## 2023-10-18 DIAGNOSIS — F5105 Insomnia due to other mental disorder: Secondary | ICD-10-CM

## 2023-10-22 ENCOUNTER — Ambulatory Visit

## 2023-10-22 DIAGNOSIS — M6281 Muscle weakness (generalized): Secondary | ICD-10-CM | POA: Diagnosis not present

## 2023-10-22 DIAGNOSIS — M353 Polymyalgia rheumatica: Secondary | ICD-10-CM

## 2023-10-22 DIAGNOSIS — M542 Cervicalgia: Secondary | ICD-10-CM

## 2023-10-22 DIAGNOSIS — R293 Abnormal posture: Secondary | ICD-10-CM

## 2023-10-22 DIAGNOSIS — R252 Cramp and spasm: Secondary | ICD-10-CM

## 2023-10-22 DIAGNOSIS — M797 Fibromyalgia: Secondary | ICD-10-CM | POA: Diagnosis not present

## 2023-10-22 NOTE — Therapy (Signed)
 OUTPATIENT PHYSICAL THERAPY TREATMENT     Patient Name: Danielle Bruce MRN: [id number] DOB:Mar 16, 1960, 63 y.o., female Today's Date: 10/22/2023  PT End of Session - 10/22/23 1106     Visit Number 5    Date for PT Re-Evaluation 11/14/23    Authorization Type Medicare-KX needed    Progress Note Due on Visit 10    PT Start Time 1015    PT Stop Time 1058    PT Time Calculation (min) 43 min    Activity Tolerance Patient tolerated treatment well    Behavior During Therapy [facility] for tasks assessed/performed                   Past Medical History:  Diagnosis Date   Abdominal pain, unspecified site 10/18/2012   Anemia    Anxiety    Arthritis    osteoarthritis   ASCUS (atypical squamous cells of undetermined significance) on Pap smear 05/06/2005   NEG HIGH RISK HPV--C&B BIOPSY BENIGN 12/2005   Asthma    Bipolar 2 disorder (HCC)    Cancer (HCC)    skin cancer - basal cell   Colon polyps    Complication of anesthesia    anxious afterwards, will get headaches    Constipation    Depression    Fibromyalgia 10/2013   GERD (gastroesophageal reflux disease)    Hearing loss on left    Heart murmur    never had any problems   Hemorrhoids    High cholesterol    High risk HPV infection 08/2011   cytology negative   IBS (irritable bowel syndrome)    Insomnia    Lymphocytic colitis    Lymphoma (HCC)    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 Cornett, MD;   Location: [facility];  Service: General;  Laterality: N/A;   COLONOSCOPY     numerous times   DILATION AND CURETTAGE OF UTERUS     ESOPHAGOGASTRODUODENOSCOPY     HEMORRHOID SURGERY  1993   x3   IUD REMOVAL  02/2015   Mirena   LAPAROSCOPIC LYSIS OF ADHESIONS N/A 10/16/2012   Procedure: LAPAROSCOPIC LYSIS OF ADHESIONS;  Surgeon: Debby A. Cornett, MD;  Location: [facility];  Service: General;  Laterality: N/A;   LAPAROSCOPY N/A 10/16/2012   Procedure: LAPAROSCOPY DIAGNOSTIC;  Surgeon: Debby LABOR. Cornett, MD;  Location: [facility];  Service: General;  Laterality: N/A;   PELVIC LAPAROSCOPY     RADIOLOGY WITH ANESTHESIA N/A 12/16/2015   Procedure: MRI OF BRAIN WITH AND WITHOUT CONTRAST;  Surgeon: Medication Radiologist, MD;  Location: [facility];  Service: Radiology;  Laterality: N/A;   SHOULDER SURGERY  2007/2008   SPINE SURGERY  2010   cervical   Patient Active Problem List   Diagnosis Date Noted   GAD (generalized anxiety disorder) 12/26/2017   OCD (obsessive compulsive disorder) 12/26/2017   PTSD (post-traumatic stress disorder) 12/26/2017   DDD (degenerative disc disease), cervical 07/14/2016  Primary osteoarthritis of both feet 07/14/2016   Primary osteoarthritis of both hands 07/14/2016   Other fatigue 07/14/2016   History of IBS 07/14/2016   Osteopenia of multiple sites 07/14/2016   Fibromyalgia 01/13/2016   MGUS (monoclonal gammopathy of unknown significance) 12/23/2013   Chronic cholecystitis without calculus 10/18/2012   Abdominal pain 10/18/2012   Nausea alone 10/18/2012   Constipation 10/18/2012   Depression 10/18/2012   Anxiety    ASCUS (atypical squamous cells of undetermined significance) on Pap smear    IUD    Hemorrhoids 11/29/2010   Abdominal pain, left upper quadrant 11/29/2010    PCP:  Joen Gentry, MD  REFERRING PROVIDER: Joen Gentry, MD  REFERRING DIAG: fibromyalgia  THERAPY DIAG:  Abnormal posture  Cramp and spasm  Muscle weakness  (generalized)  Cervicalgia  Polymyalgia rheumatica (HCC)  Rationale for Evaluation and Treatment Rehabilitation  ONSET DATE: chronic pain (fibromyalgia), bowel flare up 1.5 months ago  SUBJECTIVE:                                                                                                                                                                                                         SUBJECTIVE STATEMENT: I am going to start my colonoscopy prep today so I usually get a headache.     PERTINENT HISTORY:  Anxiety, bipolar disorder, depression, fibromyalgia, IBS, migraines, osteopenia, PTSD  PAIN: 10/15/23 Are you having pain: Not much, just some random soreness Pain location: Rt upper trap/neck, Rt>Lt jaw  Pain rating: 4/10 Pain description: irritating, annoying   Aggravating factors: doing too much, stress  Relieving factors: muscle relaxers, rest, stretching  PRECAUTIONS: Other: chronic pain syndrome and depression Other: slow progression with exercise due to chronic condition  WEIGHT BEARING RESTRICTIONS No  FALLS:  Has patient fallen in last 6 months? No  LIVING ENVIRONMENT: Lives with: lives with their family Lives in: House/apartment  OCCUPATION: on disability  PLOF: Independent with basic ADLs and Leisure: yardwork, walking  Pt cares for her 4 young grandchildren- difficulty with care including lifting and carrying  PATIENT GOALS be more active with less pain, exercise at the gym regularly, lift and carry grandchildren and laundry, improved ease with yardwork and housework, resume exercise  OBJECTIVE:   DIAGNOSTIC FINDINGS: none recent   PATIENT SURVEYS:  The Patient-Specific Functional Scale  Initial:  I am going to ask you to identify up to 3 important activities that you are unable to do or are having difficulty with as a result of this problem.  Today are there any activities that you are  unable to do or having difficulty with because of this?   (Patient shown scale and patient rated each activity)  Follow up: When you first came in you had difficulty performing these activities.  Today do you still have difficulty?  Patient-Specific activity scoring scheme (Point to one number):  0 1 2 3 4 5 6 7 8 9  10 Unable                                                                                                          Able to perform To perform                                                                                                    activity at the same Activity         Level as before                                                                                                                       Injury or problem  Activity   Lifting laundry and groceries/housework                           initial:  3/10  2.    Yard work- up and down with squatting          Initial: 2/10     COGNITION: Overall cognitive status: Within functional limits for tasks assessed  SENSATION: WFL  POSTURE: rounded shoulders and forward head  PALPATION: Diffuse palpable tenderness over bil neck, upper traps, thoracic, lumbar and gluteals with trigger points.    CERVICAL ROM:   Stiff at end range and pain on Rt with Lt motions.  All WFLs  UPPER EXTREMITY ROM: UE A/ROM is limited by 20% into flexion and abduction with pain at end range.  Hip flexibility is limited by 25% in all directions with pain in all directions.  UPPER EXTREMITY MMT: UE: 4+/5, LE 4+/5 except hip flexors 4-/5  TODAY'S TREATMENT:   10/22/23 Nustep level 3 for 8 min- PT monitored and discussed pt status Farmer's carry: 5# around building  each side  Ball roll outs 3 ways x10 each  Seated on ball: 3 way raises with 1# weight 2x10 each  Manual: elongation and release to suboccipital release and glide to temporalis and frontalis, PA mobs T3-6 grade 2  10/15/23 Nustep level 3 for 8 min- PT monitored and discussed pt status Melt Method on foam roll- head nods,  cervical rotation with pressure, lateral nods and circles on each side Ball roll outs 3 ways x10 each  Seated on ball: 3 way raises with 1# weight x10 each  Manual: elongation and release to suboccipital release and glide to temporalis and frontalis  10/10/23; Pt arrives for aquatic physical therapy. Treatment took place in 3.5-5.5 feet of water. Water temperature was 91 degrees F. Pt entered the pool via stairs independently. Pt requires buoyancy of water for support and to offload joints with strengthening exercises.  Pt utilizes viscosity of the water required for strengthening. Seated water bench with 75% submersion Pt performed seated LE AROM exercises 20x in all planes for initial warm up and review of todays status. 75%-50% water depth for the following: water walking with UE push/pull with rainbow wts 6x each direction, hip kicks Bil 10x (VC to use a medium resistance today) high knee marching with single buoy floats by herside 4 lengths. Low back stretch holding onto rails at the ladder/feet in the wall ladder: VC to let her tailbone be heavy: 3x 3 -4 breaths. Horseback bicycle 6 min on yellow noodle. End session with 2 min seated decompression using yellow noodle.   HOME EXERCISE PROGRAM:  Access Code: [email or web address]- previously provided  Access Code: [email or web address] URL: [email or web address] Date: 09/19/2023 Prepared by: Burnard  Exercises - Seated Cervical Flexion AROM  - 3 x daily - 7 x weekly - 1 sets - 3 reps - 20 hold - Seated Cervical Sidebending AROM  - 3 x daily - 7 x weekly - 1 sets - 3 reps - 20 hold - Seated Cervical Rotation AROM  - 3 x daily - 7 x weekly - 1 sets - 3 reps - 20 hold - Seated Correct Posture  - 1 x daily - 7 x weekly - 3 sets - 10 reps - Seated Diaphragmatic Breathing  - 5 x daily - 7 x weekly - 1 sets - 10 reps - Sidelying Open Book Thoracic Rotation with Knee on Foam Roll  - 2 x daily - 7 x weekly - 3 sets - 10 reps - Seated Transversus Abdominis  Bracing  - 5 x daily - 7 x weekly - 1 sets - 10 reps - 5 hold - Seated Hamstring Stretch  - 3 x daily - 7 x weekly - 1 sets - 3 reps - 20 hold ASSESSMENT:  CLINICAL IMPRESSION: Pt will consider dry needling for muscle tension and doesn't want to do it today due to colonscopy prep and unable to take any pain meds after dry needling. Pt has been more consistent with HEP at home.  PT monitored throughout session for pain and to provided verbal/tactile cues.  Reduced PA mobility in thoracic spine with manual therapy.   Patient will benefit from skilled PT to address the below impairments and improve overall function.     OBJECTIVE IMPAIRMENTS decreased activity tolerance, decreased endurance, difficulty walking, decreased strength, increased muscle spasms, impaired flexibility, improper body mechanics, postural dysfunction, and pain.   ACTIVITY LIMITATIONS carrying, lifting, sitting, standing, reach over head, hygiene/grooming, and locomotion level  PARTICIPATION LIMITATIONS: meal prep, cleaning, laundry, driving, community  activity, and yard work  PERSONAL FACTORS Past/current experiences, Time since onset of injury/illness/exacerbation, and 3+ comorbidities: fibromyalgia depression, anxiety,  are also affecting patient's functional outcome.   REHAB POTENTIAL: Good  CLINICAL DECISION MAKING: Evolving/moderate complexity  EVALUATION COMPLEXITY: Moderate   GOALS: Goals reviewed with patient? Yes  SHORT TERM GOALS: Target date: 10/17/2023    Return to regular performance of HEP 4-5x/week to improve mobility  Baseline: has not been exercising regularly due to GI pain (09/19/23) Goal status: NEW  2.  Rate housework > or = to 4/10 on patient specific functional scale (PSFS) Baseline: 3/10 Goal status: Ongoing  3.  Verbalize and demonstrate abdominal massage daily to reduce GI pain Baseline:  Goal status: Met 8/11    LONG TERM GOALS: Target date: 11/14/2023    Be independent in  advanced HEP Baseline:  Goal status:  Ongoing  2.  Rate squatting for yard work > or = to 4/10 on PSFS Baseline: 2/10 Goal status:NEW  3.  Report > or = to 20% reduction in neck and thoracic pain with daily tasks  Baseline: Goal status: NEW  4.  Walk for exercise and perform regular exercises 3-5x/wk for exercise to improve endurance  Baseline:  Goal status: NEW  5.  Rate housework > or = to 5/10 on PSFS  Baseline: 3/10   Goal status:NEW    PLAN: PT FREQUENCY: 1-2x/week  PT DURATION: 8 weeks  PLANNED INTERVENTIONS: 97164- PT Re-evaluation, 97110-Therapeutic exercises, 97530- Therapeutic activity, 97112- Neuromuscular re-education, 97535- Self Care, [id number]- Manual therapy, [phone]- Gait training, [phone]- Aquatic Therapy, [phone]- Electrical stimulation (unattended), [phone]- Electrical stimulation (manual), [id number]- Traction (mechanical), 20560 (1-2 muscles), 20561 (3+ muscles)- Dry Needling, Patient/Family education, Balance training, Taping, Joint mobilization, Spinal manipulation, Spinal mobilization, Cryotherapy, Moist heat, Therapeutic exercises, Therapeutic activity, Neuromuscular re-education, and Self Care  PLAN FOR NEXT SESSION:  Work to get pt consistent with HEP, pool PT to address chronic fatigue and abdominal symptoms, pt will consider dry needling in the future to address muscle tension   Burnard Joy, PT 10/22/23 11:07 AM

## 2023-10-23 ENCOUNTER — Ambulatory Visit: Admitting: Psychology

## 2023-10-23 DIAGNOSIS — K5730 Diverticulosis of large intestine without perforation or abscess without bleeding: Secondary | ICD-10-CM | POA: Diagnosis not present

## 2023-10-23 DIAGNOSIS — K5289 Other specified noninfective gastroenteritis and colitis: Secondary | ICD-10-CM | POA: Diagnosis not present

## 2023-10-23 DIAGNOSIS — R1084 Generalized abdominal pain: Secondary | ICD-10-CM | POA: Diagnosis not present

## 2023-10-23 DIAGNOSIS — R194 Change in bowel habit: Secondary | ICD-10-CM | POA: Diagnosis not present

## 2023-10-24 ENCOUNTER — Ambulatory Visit (INDEPENDENT_AMBULATORY_CARE_PROVIDER_SITE_OTHER): Admitting: Psychology

## 2023-10-24 DIAGNOSIS — F902 Attention-deficit hyperactivity disorder, combined type: Secondary | ICD-10-CM | POA: Diagnosis not present

## 2023-10-24 DIAGNOSIS — F422 Mixed obsessional thoughts and acts: Secondary | ICD-10-CM

## 2023-10-24 DIAGNOSIS — F4310 Post-traumatic stress disorder, unspecified: Secondary | ICD-10-CM

## 2023-10-24 NOTE — Progress Notes (Signed)
 [facility] Counselor/Therapist Progress Note  Patient ID: BRANTLEIGH MIFFLIN, MRN: [id number],    Date:10/24/2023  Time Spent: 56 minutes Time In: 11:03  Time out:11:59  Treatment Type: Individual Therapy  Reported Symptoms: flashbacks, responding to triggers, depression, anxiety, being busy all of the time, dissociation  Mental Status Exam: Appearance:  Casual     Behavior: Appropriate  Motor: normal  Speech/Language:  Clear and Coherent  Affect: Blunt  Mood: pleasant  Thought process: normal  Thought content:   WNL  Sensory/Perceptual disturbances:   WNL  Orientation: oriented to person, place, time/date, and situation  Attention: Good  Concentration: Good  Memory: WNL  Fund of knowledge:  Good  Insight:   Good  Judgment:  Fair  Impulse Control: Good   Risk Assessment: Danger to Self:  No Self-injurious Behavior: No Danger to Others: No Duty to Warn:no Physical Aggression / Violence:No  Access to Firearms a concern: No  Gang Involvement:No   Subjective: The patient attended a face-to-face individual therapy via video visit.  The patient gave permission for the session to be on caregility and she is aware of the limitations of telehealth.  The patient was in her home alone and the therapist was in the office.  The patient presents as pleasant and cooperative.  The patient reports that she had a colonoscopy yesterday and felt that she needed to do a video visit today.  We talked about how things went and it seems that it was fine and she does not have the results yet of the test for colitis.  She talked about the possibility of it being diverticulitis and that she will know something hopefully soon.  We discussed the things that she is doing that are positive.  She does not seem to be as reactive as she used to be and we have an even started the EMDR yet.  The plan is to begin that in the next few weeks and start focusing very concentrated on trying to desensitize  the trauma that she experienced when she was little and growing up.  The patient does recognize that she has made progress in therapy since she started.  Interventions: Cognitive Behavioral Therapy, Mindfulness Meditation, Eye Movement Desensitization and Reprocessing (EMDR), Insight-Oriented, and Interpersonal, psychoeducation  Diagnosis:PTSD (post-traumatic stress disorder)  Attention deficit hyperactivity disorder (ADHD), combined type  Mixed obsessional thoughts and acts  Plan: Plan of Care: Client Abilities/Strengths  Insightful, motivated, Supportive family Client Treatment Preferences  Outpatient Individual therapy/EMDR  Client Statement of Needs  I think I have PTSD and I feel like I need another kind of therapy than talk therapy Treatment Level  Outpatient Individual therapy  Symptoms  Demonstrates an exaggerated startle response.:  (Status: maintained). Depressed  or irritable mood.: (Status:improved). Describes a reliving of the event,  particularly through dissociative flashbacks.: (Status: maintained). Displays a  significant decline in interest and engagement in activities.:  (Status: improved). Displays significant psychological and/or physiological distress resulting from internal and external  clues that are reminiscent of the traumatic event.: (Status: maintained).  Experiences disturbances in sleep.:  (Status: improved). Experiences disturbing  and persistent thoughts, images, and/or perceptions of the traumatic event.:  (Status: maintained). Experiences frequent nightmares.: (Status: maintained).  Feelings of hopelessness, worthlessness, or inappropriate guilt.: No Description Entered (Status:  improved). Has been exposed to a traumatic event involving actual or perceived threat of death or  serious injury.:  (Status: maintained). Impairment in social, occupational, or  other areas of functioning.:(Status: maintained). Intentionally avoids activities,  places,  people, or objects (e.g., up-armored vehicles) that evoke memories of the event.: (Status: maintained). Intentionally avoids thoughts, feelings, or discussions related  to the traumatic event.: (Status: maintained). Reports difficulty concentrating as  well as feelings of guilt (Status:maintained). Reports response of intense fear,  helplessness, or horror to the traumatic event.:  (Status: maintained).  Problems Addressed  Unipolar Depression, Posttraumatic Stress Disorder (PTSD), Posttraumatic Stress Disorder (PTSD),   Posttraumatic Stress Disorder (PTSD), Posttraumatic Stress Disorder (PTSD)  Goals 1. Develop healthy thinking patterns and beliefs about self, others, and the world that lead to the alleviation and help prevent the relapse of  depression. Objective Identify and replace thoughts and beliefs that support depression. Target Date: 2024/01/04 Frequency: Weekly Progress: 60 Modality: individual Related Interventions 1. Explore and restructure underlying assumptions and beliefs reflected in biased self-talk that  may put the client at risk for relapse or recurrence. 2. Conduct Cognitive-Behavioral Therapy (see Cognitive Behavior Therapy by Almarie; Overcoming Depression by Marine dunker al.), beginning with helping the client learn the connection among  cognition, depressive feelings, and actions. 2. Eliminate or reduce the negative impact trauma related symptoms have  on social, occupational, and family functioning. Objective Learn and implement personal skills to manage challenging situations related to trauma. Target Date: 2024/01/04 Frequency: Weekly Progress: 20 Modality: individual 3. No longer avoids persons, places, activities, and objects that are  reminiscent of the traumatic event. Objective Participate in Eye Movement Desensitization and Reprocessing (EMDR) to reduce emotional distress  related to traumatic thoughts, feelings, and images. Target Date: 2024/01/04  Frequency: Weekly Progress: 0 Modality: individual   Related Interventions 1. Utilize Eye Movement Desensitization and Reprocessing (EMDR) to reduce the client's  emotional reactivity to the traumatic event and reduce PTSD symptoms. Objective Learn and implement guided self-dialogue to manage thoughts, feelings, and urges brought on by  encounters with trauma-related situations. Target Date: 2025-11/07 Frequency: Weekly Progress: 20 Modality: individual Related Interventions 1. Teach the client a guided self-dialogue procedure in which he/she learns to recognize  maladaptive self-talk, challenges its biases, copes with engendered feelings, overcomes  avoidance, and reinforces his/her accomplishments; review and reinforce progress, problemsolve obstacles. 4. No longer experiences intrusive event recollections, avoidance of event  reminders, intense arousal, or disinterest in activities or  relationships. 5. Thinks about or openly discusses the traumatic event with others  without experiencing psychological or physiological distress. Diagnosis Axis  none F43.10 (Posttraumatic stress disorder) - Open - [Signifier: n/a] Posttraumatic Stress  Disorder  Conditions For Discharge Achievement of treatment goals and objectives   Rodric Punch G Anoushka Divito, LCSW

## 2023-10-25 DIAGNOSIS — K5289 Other specified noninfective gastroenteritis and colitis: Secondary | ICD-10-CM | POA: Diagnosis not present

## 2023-10-26 ENCOUNTER — Encounter: Payer: Self-pay | Admitting: Physical Therapy

## 2023-10-26 ENCOUNTER — Other Ambulatory Visit: Payer: Self-pay | Admitting: Family Medicine

## 2023-10-26 ENCOUNTER — Ambulatory Visit: Admitting: Physical Therapy

## 2023-10-26 DIAGNOSIS — M353 Polymyalgia rheumatica: Secondary | ICD-10-CM | POA: Diagnosis not present

## 2023-10-26 DIAGNOSIS — M542 Cervicalgia: Secondary | ICD-10-CM | POA: Diagnosis not present

## 2023-10-26 DIAGNOSIS — M6281 Muscle weakness (generalized): Secondary | ICD-10-CM | POA: Diagnosis not present

## 2023-10-26 DIAGNOSIS — M79672 Pain in left foot: Secondary | ICD-10-CM

## 2023-10-26 DIAGNOSIS — R293 Abnormal posture: Secondary | ICD-10-CM | POA: Diagnosis not present

## 2023-10-26 DIAGNOSIS — R109 Unspecified abdominal pain: Secondary | ICD-10-CM

## 2023-10-26 DIAGNOSIS — M79671 Pain in right foot: Secondary | ICD-10-CM

## 2023-10-26 DIAGNOSIS — R252 Cramp and spasm: Secondary | ICD-10-CM

## 2023-10-26 DIAGNOSIS — M797 Fibromyalgia: Secondary | ICD-10-CM | POA: Diagnosis not present

## 2023-10-26 NOTE — Therapy (Signed)
 OUTPATIENT PHYSICAL THERAPY TREATMENT     Patient Name: Danielle Bruce MRN: [id number] DOB:Nov 25, 1960, 63 y.o., female Today's Date: 10/26/2023  PT End of Session - 10/26/23 1345     Visit Number 6    Date for PT Re-Evaluation 11/14/23    Authorization Type Medicare-KX needed    Progress Note Due on Visit 10    PT Start Time 1325    PT Stop Time 1410    PT Time Calculation (min) 45 min                    Past Medical History:  Diagnosis Date   Abdominal pain, unspecified site 10/18/2012   Anemia    Anxiety    Arthritis    osteoarthritis   ASCUS (atypical squamous cells of undetermined significance) on Pap smear 05/06/2005   NEG HIGH RISK HPV--C&B BIOPSY BENIGN 12/2005   Asthma    Bipolar 2 disorder (HCC)    Cancer (HCC)    skin cancer - basal cell   Colon polyps    Complication of anesthesia    anxious afterwards, will get headaches    Constipation    Depression    Fibromyalgia 10/2013   GERD (gastroesophageal reflux disease)    Hearing loss on left    Heart murmur    never had any problems   Hemorrhoids    High cholesterol    High risk HPV infection 08/2011   cytology negative   IBS (irritable bowel syndrome)    Insomnia    Lymphocytic colitis    Lymphoma (HCC)    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 Cornett, MD;  Location: [facility];  Service: General;  Laterality: N/A;   COLONOSCOPY     numerous times   DILATION AND  CURETTAGE OF UTERUS     ESOPHAGOGASTRODUODENOSCOPY     HEMORRHOID SURGERY  1993   x3   IUD REMOVAL  02/2015   Mirena   LAPAROSCOPIC LYSIS OF ADHESIONS N/A 10/16/2012   Procedure: LAPAROSCOPIC LYSIS OF ADHESIONS;  Surgeon: Debby A. Cornett, MD;  Location: [facility];  Service: General;  Laterality: N/A;   LAPAROSCOPY N/A 10/16/2012   Procedure: LAPAROSCOPY DIAGNOSTIC;  Surgeon: Debby LABOR. Cornett, MD;  Location: [facility];  Service: General;  Laterality: N/A;   PELVIC LAPAROSCOPY     RADIOLOGY WITH ANESTHESIA N/A 12/16/2015   Procedure: MRI OF BRAIN WITH AND WITHOUT CONTRAST;  Surgeon: Medication Radiologist, MD;  Location: [facility];  Service: Radiology;  Laterality: N/A;   SHOULDER SURGERY  2007/2008   SPINE SURGERY  2010   cervical   Patient Active Problem List   Diagnosis Date Noted   GAD (generalized anxiety disorder) 12/26/2017   OCD (obsessive compulsive disorder) 12/26/2017   PTSD (post-traumatic stress disorder) 12/26/2017   DDD (degenerative disc disease), cervical 07/14/2016   Primary osteoarthritis of both feet 07/14/2016   Primary osteoarthritis of both hands 07/14/2016  Other fatigue 07/14/2016   History of IBS 07/14/2016   Osteopenia of multiple sites 07/14/2016   Fibromyalgia 01/13/2016   MGUS (monoclonal gammopathy of unknown significance) 12/23/2013   Chronic cholecystitis without calculus 10/18/2012   Abdominal pain 10/18/2012   Nausea alone 10/18/2012   Constipation 10/18/2012   Depression 10/18/2012   Anxiety    ASCUS (atypical squamous cells of undetermined significance) on Pap smear    IUD    Hemorrhoids 11/29/2010   Abdominal pain, left upper quadrant 11/29/2010    PCP:  Joen Gentry, MD  REFERRING PROVIDER: Joen Gentry, MD  REFERRING DIAG: fibromyalgia  THERAPY DIAG:  Abnormal posture  Cramp and spasm  Muscle weakness (generalized)  Polymyalgia rheumatica (HCC)  Pain in right foot  Pain in left foot  Rationale for Evaluation and Treatment  Rehabilitation  ONSET DATE: chronic pain (fibromyalgia), bowel flare up 1.5 months ago  SUBJECTIVE:                                                                                                                                                                                                         SUBJECTIVE STATEMENT:  Good day   PERTINENT HISTORY:  Anxiety, bipolar disorder, depression, fibromyalgia, IBS, migraines, osteopenia, PTSD  PAIN: 10/15/23 Are you having pain: Not much, just some random soreness Pain location: Rt upper trap/neck, Rt>Lt jaw  Pain rating: 4/10 Pain description: irritating, annoying   Aggravating factors: doing too much, stress  Relieving factors: muscle relaxers, rest, stretching  PRECAUTIONS: Other: chronic pain syndrome and depression Other: slow progression with exercise due to chronic condition  WEIGHT BEARING RESTRICTIONS No  FALLS:  Has patient fallen in last 6 months? No  LIVING ENVIRONMENT: Lives with: lives with their family Lives in: House/apartment  OCCUPATION: on disability  PLOF: Independent with basic ADLs and Leisure: yardwork, walking  Pt cares for her 4 young grandchildren- difficulty with care including lifting and carrying  PATIENT GOALS be more active with less pain, exercise at the gym regularly, lift and carry grandchildren and laundry, improved ease with yardwork and housework, resume exercise  OBJECTIVE:   DIAGNOSTIC FINDINGS: none recent   PATIENT SURVEYS:  The Patient-Specific Functional Scale  Initial:  I am going to ask you to identify up to 3 important activities that you are unable to do or are having difficulty with as a result of this problem.  Today are there any activities that you are unable to do or having difficulty with because of this?  (Patient shown scale and patient rated each activity)  Follow up:  When you first came in you had difficulty performing these activities.  Today do you still have  difficulty?  Patient-Specific activity scoring scheme (Point to one number):  0 1 2 3 4 5 6 7 8 9  10 Unable                                                                                                          Able to perform To perform                                                                                                    activity at the same Activity         Level as before                                                                                                                       Injury or problem  Activity   Lifting laundry and groceries/housework                           initial:  3/10  2.    Yard work- up and down with squatting          Initial: 2/10     COGNITION: Overall cognitive status: Within functional limits for tasks assessed  SENSATION: WFL  POSTURE: rounded shoulders and forward head  PALPATION: Diffuse palpable tenderness over bil neck, upper traps, thoracic, lumbar and gluteals with trigger points.    CERVICAL ROM:   Stiff at end range and pain on Rt with Lt motions.  All WFLs  UPPER EXTREMITY ROM: UE A/ROM is limited by 20% into flexion and abduction with pain at end range.  Hip flexibility is limited by 25% in all directions with pain in all directions.  UPPER EXTREMITY MMT: UE: 4+/5, LE 4+/5 except hip flexors 4-/5  TODAY'S TREATMENT:   10/26/23:Pt arrives for aquatic physical therapy. Treatment took place in 3.5-5.5 feet of water. Water temperature was 91 degrees F. Pt entered the pool via stairs independently. Pt requires buoyancy of water for support and to offload joints with strengthening  exercises.  Pt utilizes viscosity of the water required for strengthening. Seated water bench with 75% submersion Pt performed seated LE AROM exercises 20x in all planes for initial warm up and review of todays status. 75%-50% water depth for the following: water walking with UE push/pull with rainbow wts 6x each direction, hip kicks Bil  20x with added ankle fins.  high knee marching with single buoy floats by herside  lengths. Low back stretch holding onto rails at the ladder/feet in the wall ladder: VC to let her tailbone be heavy: 3x 3 -4 breaths. Horseback bicycle 6 min on yellow noodle. End session with 2 min seated decompression using yellow noodle.  10/22/23 Nustep level 3 for 8 min- PT monitored and discussed pt status Farmer's carry: 5# around building each side  Ball roll outs 3 ways x10 each  Seated on ball: 3 way raises with 1# weight 2x10 each  Manual: elongation and release to suboccipital release and glide to temporalis and frontalis, PA mobs T3-6 grade 2  10/15/23 Nustep level 3 for 8 min- PT monitored and discussed pt status Melt Method on foam roll- head nods, cervical rotation with pressure, lateral nods and circles on each side Ball roll outs 3 ways x10 each  Seated on ball: 3 way raises with 1# weight x10 each  Manual: elongation and release to suboccipital release and glide to temporalis and frontalis  HOME EXERCISE PROGRAM:  Access Code: [email or web address]- previously provided  Access Code: [email or web address] URL: [email or web address] Date: 09/19/2023 Prepared by: Burnard  Exercises - Seated Cervical Flexion AROM  - 3 x daily - 7 x weekly - 1 sets - 3 reps - 20 hold - Seated Cervical Sidebending AROM  - 3 x daily - 7 x weekly - 1 sets - 3 reps - 20 hold - Seated Cervical Rotation AROM  - 3 x daily - 7 x weekly - 1 sets - 3 reps - 20 hold - Seated Correct Posture  - 1 x daily - 7 x weekly - 3 sets - 10 reps - Seated Diaphragmatic Breathing  - 5 x daily - 7 x weekly - 1 sets - 10 reps - Sidelying Open Book Thoracic Rotation with Knee on Foam Roll  - 2 x daily - 7 x weekly - 3 sets - 10 reps - Seated Transversus Abdominis Bracing  - 5 x daily - 7 x weekly - 1 sets - 10 reps - 5 hold - Seated Hamstring Stretch  - 3 x daily - 7 x weekly - 1 sets - 3 reps - 20 hold ASSESSMENT:  CLINICAL IMPRESSION:  Excellent effort in the pool today. Pt really pushed herself, good pushes and pulls with her floats really maximizing her muscular strength without pain OR significant fatigue.   OBJECTIVE IMPAIRMENTS decreased activity tolerance, decreased endurance, difficulty walking, decreased strength, increased muscle spasms, impaired flexibility, improper body mechanics, postural dysfunction, and pain.   ACTIVITY LIMITATIONS carrying, lifting, sitting, standing, reach over head, hygiene/grooming, and locomotion level  PARTICIPATION LIMITATIONS: meal prep, cleaning, laundry, driving, community activity, and yard work  PERSONAL FACTORS Past/current experiences, Time since onset of injury/illness/exacerbation, and 3+ comorbidities: fibromyalgia depression, anxiety,  are also affecting patient's functional outcome.   REHAB POTENTIAL: Good  CLINICAL DECISION MAKING: Evolving/moderate complexity  EVALUATION COMPLEXITY: Moderate   GOALS: Goals reviewed with patient? Yes  SHORT TERM GOALS: Target date: 10/17/2023    Return to regular performance of HEP 4-5x/week to improve mobility  Baseline: has not been exercising regularly due  to GI pain (09/19/23) Goal status: NEW  2.  Rate housework > or = to 4/10 on patient specific functional scale (PSFS) Baseline: 3/10 Goal status: Ongoing  3.  Verbalize and demonstrate abdominal massage daily to reduce GI pain Baseline:  Goal status: Met 8/11    LONG TERM GOALS: Target date: 11/14/2023    Be independent in advanced HEP Baseline:  Goal status:  Ongoing  2.  Rate squatting for yard work > or = to 4/10 on PSFS Baseline: 2/10 Goal status:NEW  3.  Report > or = to 20% reduction in neck and thoracic pain with daily tasks  Baseline: Goal status: NEW  4.  Walk for exercise and perform regular exercises 3-5x/wk for exercise to improve endurance  Baseline:  Goal status: NEW  5.  Rate housework > or = to 5/10 on PSFS  Baseline: 3/10   Goal  status:NEW    PLAN: PT FREQUENCY: 1-2x/week  PT DURATION: 8 weeks  PLANNED INTERVENTIONS: 97164- PT Re-evaluation, 97110-Therapeutic exercises, 97530- Therapeutic activity, 97112- Neuromuscular re-education, 97535- Self Care, [id number]- Manual therapy, [phone]- Gait training, [phone]- Aquatic Therapy, [phone]- Electrical stimulation (unattended), [phone]- Electrical stimulation (manual), [id number]- Traction (mechanical), 20560 (1-2 muscles), 20561 (3+ muscles)- Dry Needling, Patient/Family education, Balance training, Taping, Joint mobilization, Spinal manipulation, Spinal mobilization, Cryotherapy, Moist heat, Therapeutic exercises, Therapeutic activity, Neuromuscular re-education, and Self Care  PLAN FOR NEXT SESSION:: Assess response to pool  Delon Darner, PTA 10/26/23 1:49 PM

## 2023-10-31 ENCOUNTER — Encounter

## 2023-11-02 ENCOUNTER — Ambulatory Visit: Admitting: Physical Therapy

## 2023-11-06 ENCOUNTER — Ambulatory Visit (INDEPENDENT_AMBULATORY_CARE_PROVIDER_SITE_OTHER): Admitting: Psychology

## 2023-11-06 DIAGNOSIS — F902 Attention-deficit hyperactivity disorder, combined type: Secondary | ICD-10-CM

## 2023-11-06 DIAGNOSIS — F4310 Post-traumatic stress disorder, unspecified: Secondary | ICD-10-CM | POA: Diagnosis not present

## 2023-11-06 DIAGNOSIS — F422 Mixed obsessional thoughts and acts: Secondary | ICD-10-CM | POA: Diagnosis not present

## 2023-11-06 NOTE — Progress Notes (Signed)
 [facility] Counselor/Therapist Progress Note  Patient ID: Danielle Bruce, MRN: [id number],    Date:11/06/2023  Time Spent: 60 minutes Time In: 3:00  Time out:4:00  Treatment Type: Individual Therapy  Reported Symptoms: flashbacks, responding to triggers, depression, anxiety, being busy all of the time, dissociation  Mental Status Exam: Appearance:  Casual     Behavior: Appropriate  Motor: normal  Speech/Language:  Clear and Coherent  Affect: Blunt  Mood: Sad and anxious  Thought process: normal  Thought content:   WNL  Sensory/Perceptual disturbances:   WNL  Orientation: oriented to person, place, time/date, and situation  Attention: Good  Concentration: Good  Memory: WNL  Fund of knowledge:  Good  Insight:   Good  Judgment:  Fair  Impulse Control: Good   Risk Assessment: Danger to Self:  No Self-injurious Behavior: No Danger to Others: No Duty to Warn:no Physical Aggression / Violence:No  Access to Firearms a concern: No  Gang Involvement:No   Subjective: The patient attended a face-to-face individual therapy in the office today.  The patient presents as somewhat sad and anxious today.  The patient reports that she went down to the [place] and spent some time with her daughter and her children.  Apparently they had quite a difficult time because her daughter does not discipline her children and her children were pretty wild apparently.  The patient also reports that she was not feeling well and her daughter was being very critical of the job she was doing taking care of of her children.  The patient decided to come back early because she felt like her daughter was being somewhat abusive to her.  The patient had said that she feels like she has gotten much better about being able to set limits with her daughter and apparently her daughter got more verbally aggressive with her and told her that obviously she has a Stage manager who is doing damage that she  works in her practice to unravel.  We processed this further and it seems that it is possible that her daughter does not like that her mother is now setting limits with her and that she cannot manipulate her anymore.  I encouraged the patient to continue to have limits and boundaries and take care of herself. Interventions: Cognitive Behavioral Therapy, Mindfulness Meditation, Eye Movement Desensitization and Reprocessing (EMDR), Insight-Oriented, and Interpersonal, psychoeducation  Diagnosis:PTSD (post-traumatic stress disorder)  Attention deficit hyperactivity disorder (ADHD), combined type  Mixed obsessional thoughts and acts  Plan: Plan of Care: Client Abilities/Strengths  Insightful, motivated, Supportive family Client Treatment Preferences  Outpatient Individual therapy/EMDR  Client Statement of Needs  I think I have PTSD and I feel like I need another kind of therapy than talk therapy Treatment Level  Outpatient Individual therapy  Symptoms  Demonstrates an exaggerated startle response.:  (Status: maintained). Depressed  or irritable mood.: (Status:improved). Describes a reliving of the event,  particularly through dissociative flashbacks.: (Status: maintained). Displays a  significant decline in interest and engagement in activities.:  (Status: improved). Displays significant psychological and/or physiological distress resulting from internal and external  clues that are reminiscent of the traumatic event.: (Status: maintained).  Experiences disturbances in sleep.:  (Status: improved). Experiences disturbing  and persistent thoughts, images, and/or perceptions of the traumatic event.:  (Status: maintained). Experiences frequent nightmares.: (Status: maintained).  Feelings of hopelessness, worthlessness, or inappropriate guilt.: No Description Entered (Status:  improved). Has been exposed to a traumatic event involving actual or perceived threat of death or  serious injury.:   (Status: maintained). Impairment in social, occupational, or  other areas of functioning.:(Status: maintained). Intentionally avoids activities,  places, people, or objects (e.g., up-armored vehicles) that evoke memories of the event.: (Status: maintained). Intentionally avoids thoughts, feelings, or discussions related  to the traumatic event.: (Status: maintained). Reports difficulty concentrating as  well as feelings of guilt (Status:maintained). Reports response of intense fear,  helplessness, or horror to the traumatic event.:  (Status: maintained).  Problems Addressed  Unipolar Depression, Posttraumatic Stress Disorder (PTSD), Posttraumatic Stress Disorder (PTSD),   Posttraumatic Stress Disorder (PTSD), Posttraumatic Stress Disorder (PTSD)  Goals 1. Develop healthy thinking patterns and beliefs about self, others, and the world that lead to the alleviation and help prevent the relapse of  depression. Objective Identify and replace thoughts and beliefs that support depression. Target Date: 2024/01/04 Frequency: Weekly Progress: 60 Modality: individual Related Interventions 1. Explore and restructure underlying assumptions and beliefs reflected in biased self-talk that  may put the client at risk for relapse or recurrence. 2. Conduct Cognitive-Behavioral Therapy (see Cognitive Behavior Therapy by Almarie; Overcoming Depression by Marine dunker al.), beginning with helping the client learn the connection among  cognition, depressive feelings, and actions. 2. Eliminate or reduce the negative impact trauma related symptoms have  on social, occupational, and family functioning. Objective Learn and implement personal skills to manage challenging situations related to trauma. Target Date: 2024/01/04 Frequency: Weekly Progress: 20 Modality: individual 3. No longer avoids persons, places, activities, and objects that are  reminiscent of the traumatic event. Objective Participate in Eye  Movement Desensitization and Reprocessing (EMDR) to reduce emotional distress  related to traumatic thoughts, feelings, and images. Target Date: 2024/01/04 Frequency: Weekly Progress: 0 Modality: individual   Related Interventions 1. Utilize Eye Movement Desensitization and Reprocessing (EMDR) to reduce the client's  emotional reactivity to the traumatic event and reduce PTSD symptoms. Objective Learn and implement guided self-dialogue to manage thoughts, feelings, and urges brought on by  encounters with trauma-related situations. Target Date: 2025-11/07 Frequency: Weekly Progress: 20 Modality: individual Related Interventions 1. Teach the client a guided self-dialogue procedure in which he/she learns to recognize  maladaptive self-talk, challenges its biases, copes with engendered feelings, overcomes  avoidance, and reinforces his/her accomplishments; review and reinforce progress, problemsolve obstacles. 4. No longer experiences intrusive event recollections, avoidance of event  reminders, intense arousal, or disinterest in activities or  relationships. 5. Thinks about or openly discusses the traumatic event with others  without experiencing psychological or physiological distress. Diagnosis Axis  none F43.10 (Posttraumatic stress disorder) - Open - [Signifier: n/a] Posttraumatic Stress  Disorder  Conditions For Discharge Achievement of treatment goals and objectives   Kayti Poss G Ronald Londo, LCSW

## 2023-11-07 ENCOUNTER — Ambulatory Visit: Attending: Family Medicine

## 2023-11-07 DIAGNOSIS — R293 Abnormal posture: Secondary | ICD-10-CM | POA: Insufficient documentation

## 2023-11-07 DIAGNOSIS — M6281 Muscle weakness (generalized): Secondary | ICD-10-CM | POA: Insufficient documentation

## 2023-11-07 DIAGNOSIS — R252 Cramp and spasm: Secondary | ICD-10-CM | POA: Insufficient documentation

## 2023-11-07 DIAGNOSIS — M542 Cervicalgia: Secondary | ICD-10-CM | POA: Insufficient documentation

## 2023-11-07 DIAGNOSIS — M353 Polymyalgia rheumatica: Secondary | ICD-10-CM | POA: Insufficient documentation

## 2023-11-07 NOTE — Therapy (Signed)
 OUTPATIENT PHYSICAL THERAPY TREATMENT     Patient Name: Danielle Bruce MRN: [id number] DOB:05-12-1960, 63 y.o., female Today's Date: 11/07/2023  PT End of Session - 11/07/23 1104     Visit Number 7    Date for PT Re-Evaluation 11/14/23    Authorization Type Medicare-KX needed    Progress Note Due on Visit 10    PT Start Time 1017    PT Stop Time 1059    PT Time Calculation (min) 42 min    Activity Tolerance Patient tolerated treatment well    Behavior During Therapy [facility] for tasks assessed/performed                     Past Medical History:  Diagnosis Date   Abdominal pain, unspecified site 10/18/2012   Anemia    Anxiety    Arthritis    osteoarthritis   ASCUS (atypical squamous cells of undetermined significance) on Pap smear 05/06/2005   NEG HIGH RISK HPV--C&B BIOPSY BENIGN 12/2005   Asthma    Bipolar 2 disorder (HCC)    Cancer (HCC)    skin cancer - basal cell   Colon polyps    Complication of anesthesia    anxious afterwards, will get headaches    Constipation    Depression    Fibromyalgia 10/2013   GERD (gastroesophageal reflux disease)    Hearing loss on left    Heart murmur    never had any problems   Hemorrhoids    High cholesterol    High risk HPV infection 08/2011   cytology negative   IBS (irritable bowel syndrome)    Insomnia    Lymphocytic colitis    Lymphoma (HCC)    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 Cornett,  MD;  Location: [facility];  Service: General;  Laterality: N/A;   COLONOSCOPY     numerous times   DILATION AND CURETTAGE OF UTERUS     ESOPHAGOGASTRODUODENOSCOPY     HEMORRHOID SURGERY  1993   x3   IUD REMOVAL  02/2015   Mirena   LAPAROSCOPIC LYSIS OF ADHESIONS N/A 10/16/2012   Procedure: LAPAROSCOPIC LYSIS OF ADHESIONS;  Surgeon: Debby A. Cornett, MD;  Location: [facility];  Service: General;  Laterality: N/A;   LAPAROSCOPY N/A 10/16/2012   Procedure: LAPAROSCOPY DIAGNOSTIC;  Surgeon: Debby LABOR. Cornett, MD;  Location: [facility];  Service: General;  Laterality: N/A;   PELVIC LAPAROSCOPY     RADIOLOGY WITH ANESTHESIA N/A 12/16/2015   Procedure: MRI OF BRAIN WITH AND WITHOUT CONTRAST;  Surgeon: Medication Radiologist, MD;  Location: [facility];  Service: Radiology;  Laterality: N/A;   SHOULDER SURGERY  2007/2008   SPINE SURGERY  2010   cervical   Patient Active Problem List   Diagnosis Date Noted   GAD (generalized anxiety disorder) 12/26/2017   OCD (obsessive compulsive disorder) 12/26/2017   PTSD (post-traumatic stress disorder) 12/26/2017   DDD (degenerative disc disease),  cervical 07/14/2016   Primary osteoarthritis of both feet 07/14/2016   Primary osteoarthritis of both hands 07/14/2016   Other fatigue 07/14/2016   History of IBS 07/14/2016   Osteopenia of multiple sites 07/14/2016   Fibromyalgia 01/13/2016   MGUS (monoclonal gammopathy of unknown significance) 12/23/2013   Chronic cholecystitis without calculus 10/18/2012   Abdominal pain 10/18/2012   Nausea alone 10/18/2012   Constipation 10/18/2012   Depression 10/18/2012   Anxiety    ASCUS (atypical squamous cells of undetermined significance) on Pap smear    IUD    Hemorrhoids 11/29/2010   Abdominal pain, left upper quadrant 11/29/2010    PCP:  Joen Gentry, MD  REFERRING PROVIDER: Joen Gentry, MD  REFERRING DIAG: fibromyalgia  THERAPY DIAG:  Abnormal posture  Cramp and spasm  Muscle weakness  (generalized)  Polymyalgia rheumatica (HCC)  Cervicalgia  Rationale for Evaluation and Treatment Rehabilitation  ONSET DATE: chronic pain (fibromyalgia), bowel flare up 1.5 months ago  SUBJECTIVE:                                                                                                                                                                                                         SUBJECTIVE STATEMENT:  I was at my daughter's house last week helping with the kids and it was stressful.  I have a lot of tension in my neck, chest and jaw.  The manual work we did last time was good.     PERTINENT HISTORY:  Anxiety, bipolar disorder, depression, fibromyalgia, IBS, migraines, osteopenia, PTSD  PAIN: 11/07/23 Are you having pain: Not much, just some random soreness Pain location:neck, upper trap, pecs Rt>Lt jaw  Pain rating: 5/10 Pain description: irritating, annoying   Aggravating factors: doing too much, stress  Relieving factors: muscle relaxers, rest, stretching  PRECAUTIONS: Other: chronic pain syndrome and depression Other: slow progression with exercise due to chronic condition  WEIGHT BEARING RESTRICTIONS No  FALLS:  Has patient fallen in last 6 months? No  LIVING ENVIRONMENT: Lives with: lives with their family Lives in: House/apartment  OCCUPATION: on disability  PLOF: Independent with basic ADLs and Leisure: yardwork, walking  Pt cares for her 4 young grandchildren- difficulty with care including lifting and carrying  PATIENT GOALS be more active with less pain, exercise at the gym regularly, lift and carry grandchildren and laundry, improved ease with yardwork and housework, resume exercise  OBJECTIVE:   DIAGNOSTIC FINDINGS: none recent   PATIENT SURVEYS:  The Patient-Specific Functional Scale  Initial:  I am going to ask you to identify up to  3 important activities that you are unable to do or are having difficulty with as a result of this  problem.  Today are there any activities that you are unable to do or having difficulty with because of this?  (Patient shown scale and patient rated each activity)  Follow up: When you first came in you had difficulty performing these activities.  Today do you still have difficulty?  Patient-Specific activity scoring scheme (Point to one number):  0 1 2 3 4 5 6 7 8 9  10 Unable                                                                                                          Able to perform To perform                                                                                                    activity at the same Activity         Level as before                                                                                                                       Injury or problem  Activity   Lifting laundry and groceries/housework                           initial:  3/10  2.    Yard work- up and down with squatting          Initial: 2/10     COGNITION: Overall cognitive status: Within functional limits for tasks assessed  SENSATION: WFL  POSTURE: rounded shoulders and forward head  PALPATION: Diffuse palpable tenderness over bil neck, upper traps, thoracic, lumbar and gluteals with trigger points.    CERVICAL ROM:   Stiff at end range and pain on Rt with Lt motions.  All WFLs  UPPER EXTREMITY ROM: UE A/ROM is limited by 20% into flexion and abduction with pain at end range.  Hip flexibility is limited by 25% in all directions with pain in all directions.  UPPER EXTREMITY MMT: UE: 4+/5,  LE 4+/5 except hip flexors 4-/5  TODAY'S TREATMENT:  11/07/23 Nustep level 3 for 8 min- PT monitored and discussed pt status Supine on foam roll: pec stretch x 2 minutes, then red band: horizontal abduction and ER 2x10 Thoracic stretch with horizontal foam roll with diaphragmatic breathing and movement  Ball roll outs 3 ways x10 each  Manual: elongation and release to  suboccipital release  and UT release, mobs with movement to the neck    10/26/23:Pt arrives for aquatic physical therapy. Treatment took place in 3.5-5.5 feet of water. Water temperature was 91 degrees F. Pt entered the pool via stairs independently. Pt requires buoyancy of water for support and to offload joints with strengthening exercises.  Pt utilizes viscosity of the water required for strengthening. Seated water bench with 75% submersion Pt performed seated LE AROM exercises 20x in all planes for initial warm up and review of todays status. 75%-50% water depth for the following: water walking with UE push/pull with rainbow wts 6x each direction, hip kicks Bil 20x with added ankle fins.  high knee marching with single buoy floats by herside  lengths. Low back stretch holding onto rails at the ladder/feet in the wall ladder: VC to let her tailbone be heavy: 3x 3 -4 breaths. Horseback bicycle 6 min on yellow noodle. End session with 2 min seated decompression using yellow noodle.  10/22/23 Nustep level 3 for 8 min- PT monitored and discussed pt status Farmer's carry: 5# around building each side  Ball roll outs 3 ways x10 each  Seated on ball: 3 way raises with 1# weight 2x10 each  Manual: elongation and release to suboccipital release and glide to temporalis and frontalis, PA mobs T3-6 grade 2  10/15/23 Nustep level 3 for 8 min- PT monitored and discussed pt status Melt Method on foam roll- head nods, cervical rotation with pressure, lateral nods and circles on each side Ball roll outs 3 ways x10 each  Seated on ball: 3 way raises with 1# weight x10 each  Manual: elongation and release to suboccipital release and glide to temporalis and frontalis  HOME EXERCISE PROGRAM:  Access Code: [email or web address]- previously provided  Access Code: [email or web address] URL: [email or web address] Date: 09/19/2023 Prepared by: Burnard  Exercises - Seated Cervical Flexion AROM  - 3 x daily - 7 x weekly - 1  sets - 3 reps - 20 hold - Seated Cervical Sidebending AROM  - 3 x daily - 7 x weekly - 1 sets - 3 reps - 20 hold - Seated Cervical Rotation AROM  - 3 x daily - 7 x weekly - 1 sets - 3 reps - 20 hold - Seated Correct Posture  - 1 x daily - 7 x weekly - 3 sets - 10 reps - Seated Diaphragmatic Breathing  - 5 x daily - 7 x weekly - 1 sets - 10 reps - Sidelying Open Book Thoracic Rotation with Knee on Foam Roll  - 2 x daily - 7 x weekly - 3 sets - 10 reps - Seated Transversus Abdominis Bracing  - 5 x daily - 7 x weekly - 1 sets - 10 reps - 5 hold - Seated Hamstring Stretch  - 3 x daily - 7 x weekly - 1 sets - 3 reps - 20 hold ASSESSMENT:  CLINICAL IMPRESSION: Pt with increased tension in her neck, chest and jaw today due to increased stress in her life.  She responded well to exercise today with improved mobility and reduced muscle tension.  Good  response to manual therapy with multiple trigger points in neck, chest and thoracic region.  Pt will get more consistent with her HEP now that she is home.  Patient will benefit from skilled PT to address the below impairments and improve overall function.    OBJECTIVE IMPAIRMENTS decreased activity tolerance, decreased endurance, difficulty walking, decreased strength, increased muscle spasms, impaired flexibility, improper body mechanics, postural dysfunction, and pain.   ACTIVITY LIMITATIONS carrying, lifting, sitting, standing, reach over head, hygiene/grooming, and locomotion level  PARTICIPATION LIMITATIONS: meal prep, cleaning, laundry, driving, community activity, and yard work  PERSONAL FACTORS Past/current experiences, Time since onset of injury/illness/exacerbation, and 3+ comorbidities: fibromyalgia depression, anxiety,  are also affecting patient's functional outcome.   REHAB POTENTIAL: Good  CLINICAL DECISION MAKING: Evolving/moderate complexity  EVALUATION COMPLEXITY: Moderate   GOALS: Goals reviewed with patient? Yes  SHORT TERM  GOALS: Target date: 10/17/2023    Return to regular performance of HEP 4-5x/week to improve mobility  Baseline: was consistent x 2 weeks, was out of town last week (11/07/23) Goal status: in progress   2.  Rate housework > or = to 4/10 on patient specific functional scale (PSFS) Baseline: 3/10 Goal status: Ongoing  3.  Verbalize and demonstrate abdominal massage daily to reduce GI pain Baseline:  Goal status: Met 8/11    LONG TERM GOALS: Target date: 11/14/2023    Be independent in advanced HEP Baseline:  Goal status:  Ongoing  2.  Rate squatting for yard work > or = to 4/10 on PSFS Baseline: 2/10 Goal status:NEW  3.  Report > or = to 20% reduction in neck and thoracic pain with daily tasks  Baseline: Goal status: NEW  4.  Walk for exercise and perform regular exercises 3-5x/wk for exercise to improve endurance  Baseline:  Goal status: NEW  5.  Rate housework > or = to 5/10 on PSFS  Baseline: 3/10   Goal status:NEW    PLAN: PT FREQUENCY: 1-2x/week  PT DURATION: 8 weeks  PLANNED INTERVENTIONS: 97164- PT Re-evaluation, 97110-Therapeutic exercises, 97530- Therapeutic activity, 97112- Neuromuscular re-education, 97535- Self Care, [id number]- Manual therapy, [phone]- Gait training, [phone]- Aquatic Therapy, [phone]- Electrical stimulation (unattended), [phone]- Electrical stimulation (manual), [id number]- Traction (mechanical), 20560 (1-2 muscles), 20561 (3+ muscles)- Dry Needling, Patient/Family education, Balance training, Taping, Joint mobilization, Spinal manipulation, Spinal mobilization, Cryotherapy, Moist heat, Therapeutic exercises, Therapeutic activity, Neuromuscular re-education, and Self Care  PLAN FOR NEXT SESSION:: Assess response to pool  Delon Darner, PTA 11/07/23 11:07 AM

## 2023-11-08 ENCOUNTER — Ambulatory Visit
Admission: RE | Admit: 2023-11-08 | Discharge: 2023-11-08 | Disposition: A | Discharge status: Home / Self Care | Source: Ambulatory Visit | Attending: Family Medicine | Admitting: Family Medicine

## 2023-11-08 DIAGNOSIS — R109 Unspecified abdominal pain: Secondary | ICD-10-CM

## 2023-11-12 ENCOUNTER — Ambulatory Visit

## 2023-11-12 DIAGNOSIS — M542 Cervicalgia: Secondary | ICD-10-CM | POA: Diagnosis not present

## 2023-11-12 DIAGNOSIS — H43811 Vitreous degeneration, right eye: Secondary | ICD-10-CM | POA: Diagnosis not present

## 2023-11-12 DIAGNOSIS — R252 Cramp and spasm: Secondary | ICD-10-CM | POA: Diagnosis not present

## 2023-11-12 DIAGNOSIS — H35362 Drusen (degenerative) of macula, left eye: Secondary | ICD-10-CM | POA: Diagnosis not present

## 2023-11-12 DIAGNOSIS — R293 Abnormal posture: Secondary | ICD-10-CM

## 2023-11-12 DIAGNOSIS — H59813 Chorioretinal scars after surgery for detachment, bilateral: Secondary | ICD-10-CM | POA: Diagnosis not present

## 2023-11-12 DIAGNOSIS — M6281 Muscle weakness (generalized): Secondary | ICD-10-CM | POA: Diagnosis not present

## 2023-11-12 DIAGNOSIS — M353 Polymyalgia rheumatica: Secondary | ICD-10-CM | POA: Diagnosis not present

## 2023-11-12 DIAGNOSIS — H35371 Puckering of macula, right eye: Secondary | ICD-10-CM | POA: Diagnosis not present

## 2023-11-12 DIAGNOSIS — H2513 Age-related nuclear cataract, bilateral: Secondary | ICD-10-CM | POA: Diagnosis not present

## 2023-11-12 NOTE — Therapy (Signed)
 OUTPATIENT PHYSICAL THERAPY TREATMENT     Patient Name: Danielle Bruce MRN: [id number] DOB:October 26, 1960, 63 y.o., female Today's Date: 11/12/2023  PT End of Session - 11/12/23 1100     Visit Number 8    Date for PT Re-Evaluation 11/14/23    Authorization Type Medicare-KX needed    Progress Note Due on Visit 10    PT Start Time 1022   late   PT Stop Time 1100    PT Time Calculation (min) 38 min    Activity Tolerance Patient tolerated treatment well    Behavior During Therapy [facility] for tasks assessed/performed                      Past Medical History:  Diagnosis Date   Abdominal pain, unspecified site 10/18/2012   Anemia    Anxiety    Arthritis    osteoarthritis   ASCUS (atypical squamous cells of undetermined significance) on Pap smear 05/06/2005   NEG HIGH RISK HPV--C&B BIOPSY BENIGN 12/2005   Asthma    Bipolar 2 disorder (HCC)    Cancer (HCC)    skin cancer - basal cell   Colon polyps    Complication of anesthesia    anxious afterwards, will get headaches    Constipation    Depression    Fibromyalgia 10/2013   GERD (gastroesophageal reflux disease)    Hearing loss on left    Heart murmur    never had any problems   Hemorrhoids    High cholesterol    High risk HPV infection 08/2011   cytology negative   IBS (irritable bowel syndrome)    Insomnia    Lymphocytic colitis    Lymphoma (HCC)    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  Cornett, MD;  Location: [facility];  Service: General;  Laterality: N/A;   COLONOSCOPY     numerous times   DILATION AND CURETTAGE OF UTERUS     ESOPHAGOGASTRODUODENOSCOPY     HEMORRHOID SURGERY  1993   x3   IUD REMOVAL  02/2015   Mirena   LAPAROSCOPIC LYSIS OF ADHESIONS N/A 10/16/2012   Procedure: LAPAROSCOPIC LYSIS OF ADHESIONS;  Surgeon: Debby A. Cornett, MD;  Location: [facility];  Service: General;  Laterality: N/A;   LAPAROSCOPY N/A 10/16/2012   Procedure: LAPAROSCOPY DIAGNOSTIC;  Surgeon: Debby LABOR. Cornett, MD;  Location: [facility];  Service: General;  Laterality: N/A;   PELVIC LAPAROSCOPY     RADIOLOGY WITH ANESTHESIA N/A 12/16/2015   Procedure: MRI OF BRAIN WITH AND WITHOUT CONTRAST;  Surgeon: Medication Radiologist, MD;  Location: [facility];  Service: Radiology;  Laterality: N/A;   SHOULDER SURGERY  2007/2008   SPINE SURGERY  2010   cervical   Patient Active Problem List   Diagnosis Date Noted   GAD (generalized anxiety disorder) 12/26/2017   OCD (obsessive compulsive disorder) 12/26/2017   PTSD (post-traumatic stress disorder) 12/26/2017   DDD (  degenerative disc disease), cervical 07/14/2016   Primary osteoarthritis of both feet 07/14/2016   Primary osteoarthritis of both hands 07/14/2016   Other fatigue 07/14/2016   History of IBS 07/14/2016   Osteopenia of multiple sites 07/14/2016   Fibromyalgia 01/13/2016   MGUS (monoclonal gammopathy of unknown significance) 12/23/2013   Chronic cholecystitis without calculus 10/18/2012   Abdominal pain 10/18/2012   Nausea alone 10/18/2012   Constipation 10/18/2012   Depression 10/18/2012   Anxiety    ASCUS (atypical squamous cells of undetermined significance) on Pap smear    IUD    Hemorrhoids 11/29/2010   Abdominal pain, left upper quadrant 11/29/2010    PCP:  Joen Gentry, MD  REFERRING PROVIDER: Joen Gentry, MD  REFERRING DIAG: fibromyalgia  THERAPY DIAG:  Abnormal posture  Cramp and spasm  Muscle weakness  (generalized)  Cervicalgia  Polymyalgia rheumatica (HCC)  Rationale for Evaluation and Treatment Rehabilitation  ONSET DATE: chronic pain (fibromyalgia), bowel flare up 1.5 months ago  SUBJECTIVE:                                                                                                                                                                                                         SUBJECTIVE STATEMENT:  I have a lot of tension in my neck and shoulder blades.  I want to do dry needling today.    PERTINENT HISTORY:  Anxiety, bipolar disorder, depression, fibromyalgia, IBS, migraines, osteopenia, PTSD  PAIN: 11/12/23 Are you having pain: Not much, just some random soreness Pain location:neck, upper trap, pecs Rt>Lt jaw  Pain rating: 5/10 Pain description: irritating, annoying   Aggravating factors: doing too much, stress  Relieving factors: muscle relaxers, rest, stretching  PRECAUTIONS: Other: chronic pain syndrome and depression Other: slow progression with exercise due to chronic condition  WEIGHT BEARING RESTRICTIONS No  FALLS:  Has patient fallen in last 6 months? No  LIVING ENVIRONMENT: Lives with: lives with their family Lives in: House/apartment  OCCUPATION: on disability  PLOF: Independent with basic ADLs and Leisure: yardwork, walking  Pt cares for her 4 young grandchildren- difficulty with care including lifting and carrying  PATIENT GOALS be more active with less pain, exercise at the gym regularly, lift and carry grandchildren and laundry, improved ease with yardwork and housework, resume exercise  OBJECTIVE:   DIAGNOSTIC FINDINGS: none recent   PATIENT SURVEYS:  The Patient-Specific Functional Scale  Initial:  I am going to ask you to identify up to 3 important activities that you are unable to do or are having difficulty with as a result  of this problem.  Today are there any activities that you are unable to do or having difficulty with  because of this?  (Patient shown scale and patient rated each activity)  Follow up: When you first came in you had difficulty performing these activities.  Today do you still have difficulty?  Patient-Specific activity scoring scheme (Point to one number):  0 1 2 3 4 5 6 7 8 9  10 Unable                                                                                                          Able to perform To perform                                                                                                    activity at the same Activity         Level as before                                                                                                                       Injury or problem  Activity   Lifting laundry and groceries/housework                           initial:  3/10  2.    Yard work- up and down with squatting          Initial: 2/10     COGNITION: Overall cognitive status: Within functional limits for tasks assessed  SENSATION: WFL  POSTURE: rounded shoulders and forward head  PALPATION: Diffuse palpable tenderness over bil neck, upper traps, thoracic, lumbar and gluteals with trigger points.    CERVICAL ROM:   Stiff at end range and pain on Rt with Lt motions.  All WFLs  UPPER EXTREMITY ROM: UE A/ROM is limited by 20% into flexion and abduction with pain at end range.  Hip flexibility is limited by 25% in all directions with pain in all directions.  UPPER EXTREMITY MMT: UE: 4+/5, LE 4+/5 except hip flexors 4-/5  TODAY'S TREATMENT:   11/12/23 Nustep level 3 for 10  min- PT monitored and discussed pt status Ball roll outs 3 ways x10 each  Trigger Point Dry Needling  Initial Treatment: Pt instructed on Dry Needling rational, procedures, and possible side effects. Pt instructed to expect mild to moderate muscle soreness later in the day and/or into the next day.  Pt instructed in methods to reduce muscle soreness. Pt instructed to continue  prescribed HEP. Because Dry Needling was performed over or adjacent to a lung field, pt was educated on S/S of pneumothorax and to seek immediate medical attention should they occur.  Patient was educated on signs and symptoms of infection and other risk factors and advised to seek medical attention should they occur.  Patient verbalized understanding of these instructions and education.   Patient Verbal Consent Given: Yes Education Handout Provided: Yes Muscles Treated: bil cervical multifidi, bil upper traps, Rt rhomboids, bil suboccipitals Electrical Stimulation Performed: No Treatment Response/Outcome: Utilized skilled palpation to identify bony landmarks and trigger points.  Able to illicit twitch response and muscle elongation.  Soft tissue mobilization to muscles needled following DN to further promote tissue elongation and decreased pain.        11/07/23 Nustep level 3 for 8 min- PT monitored and discussed pt status Supine on foam roll: pec stretch x 2 minutes, then red band: horizontal abduction and ER 2x10 Thoracic stretch with horizontal foam roll with diaphragmatic breathing and movement  Ball roll outs 3 ways x10 each  Manual: elongation and release to suboccipital release  and UT release, mobs with movement to the neck    10/26/23:Pt arrives for aquatic physical therapy. Treatment took place in 3.5-5.5 feet of water. Water temperature was 91 degrees F. Pt entered the pool via stairs independently. Pt requires buoyancy of water for support and to offload joints with strengthening exercises.  Pt utilizes viscosity of the water required for strengthening. Seated water bench with 75% submersion Pt performed seated LE AROM exercises 20x in all planes for initial warm up and review of todays status. 75%-50% water depth for the following: water walking with UE push/pull with rainbow wts 6x each direction, hip kicks Bil 20x with added ankle fins.  high knee marching with single buoy  floats by herside  lengths. Low back stretch holding onto rails at the ladder/feet in the wall ladder: VC to let her tailbone be heavy: 3x 3 -4 breaths. Horseback bicycle 6 min on yellow noodle. End session with 2 min seated decompression using yellow noodle.    HOME EXERCISE PROGRAM:  Access Code: [email or web address]- previously provided  Access Code: [email or web address] URL: [email or web address] Date: 09/19/2023 Prepared by: Burnard  Exercises - Seated Cervical Flexion AROM  - 3 x daily - 7 x weekly - 1 sets - 3 reps - 20 hold - Seated Cervical Sidebending AROM  - 3 x daily - 7 x weekly - 1 sets - 3 reps - 20 hold - Seated Cervical Rotation AROM  - 3 x daily - 7 x weekly - 1 sets - 3 reps - 20 hold - Seated Correct Posture  - 1 x daily - 7 x weekly - 3 sets - 10 reps - Seated Diaphragmatic Breathing  - 5 x daily - 7 x weekly - 1 sets - 10 reps - Sidelying Open Book Thoracic Rotation with Knee on Foam Roll  - 2 x daily - 7 x weekly - 3 sets - 10 reps - Seated Transversus Abdominis Bracing  - 5 x daily - 7 x weekly - 1 sets -  10 reps - 5 hold - Seated Hamstring Stretch  - 3 x daily - 7 x weekly - 1 sets - 3 reps - 20 hold ASSESSMENT:  CLINICAL IMPRESSION: Pt with increased tension in her neck, and was agreeable to dry needling today.   She responded well to gentle exercise today with improved mobility and reduced muscle tension.  Good response to dry needling with improved tissue mobility and twitch response. Good response to manual therapy with multiple trigger points in neck, chest and thoracic region.  ERO scheduled for next session to assess goals.  Patient will benefit from skilled PT to address the below impairments and improve overall function.    OBJECTIVE IMPAIRMENTS decreased activity tolerance, decreased endurance, difficulty walking, decreased strength, increased muscle spasms, impaired flexibility, improper body mechanics, postural dysfunction, and pain.   ACTIVITY LIMITATIONS  carrying, lifting, sitting, standing, reach over head, hygiene/grooming, and locomotion level  PARTICIPATION LIMITATIONS: meal prep, cleaning, laundry, driving, community activity, and yard work  PERSONAL FACTORS Past/current experiences, Time since onset of injury/illness/exacerbation, and 3+ comorbidities: fibromyalgia depression, anxiety,  are also affecting patient's functional outcome.   REHAB POTENTIAL: Good  CLINICAL DECISION MAKING: Evolving/moderate complexity  EVALUATION COMPLEXITY: Moderate   GOALS: Goals reviewed with patient? Yes  SHORT TERM GOALS: Target date: 10/17/2023    Return to regular performance of HEP 4-5x/week to improve mobility  Baseline: was consistent x 2 weeks, was out of town last week (11/07/23) Goal status: in progress   2.  Rate housework > or = to 4/10 on patient specific functional scale (PSFS) Baseline: 3/10 Goal status: Ongoing  3.  Verbalize and demonstrate abdominal massage daily to reduce GI pain Baseline:  Goal status: Met 8/11    LONG TERM GOALS: Target date: 11/14/2023    Be independent in advanced HEP Baseline:  Goal status:  Ongoing  2.  Rate squatting for yard work > or = to 4/10 on PSFS Baseline: 2/10 Goal status:NEW  3.  Report > or = to 20% reduction in neck and thoracic pain with daily tasks  Baseline: Goal status: NEW  4.  Walk for exercise and perform regular exercises 3-5x/wk for exercise to improve endurance  Baseline:  Goal status: NEW  5.  Rate housework > or = to 5/10 on PSFS  Baseline: 3/10   Goal status:NEW    PLAN: PT FREQUENCY: 1-2x/week  PT DURATION: 8 weeks  PLANNED INTERVENTIONS: 97164- PT Re-evaluation, 97110-Therapeutic exercises, 97530- Therapeutic activity, 97112- Neuromuscular re-education, 97535- Self Care, [id number]- Manual therapy, [phone]- Gait training, [phone]- Aquatic Therapy, [phone]- Electrical stimulation (unattended), [phone]- Electrical stimulation (manual), [id number]- Traction (mechanical),  20560 (1-2 muscles), 20561 (3+ muscles)- Dry Needling, Patient/Family education, Balance training, Taping, Joint mobilization, Spinal manipulation, Spinal mobilization, Cryotherapy, Moist heat, Therapeutic exercises, Therapeutic activity, Neuromuscular re-education, and Self Care  PLAN FOR NEXT SESSION:: Assess response to dry needling, ERO next   [employer], PT 11/12/23 11:13 AM

## 2023-11-13 ENCOUNTER — Ambulatory Visit: Admitting: Psychology

## 2023-11-13 DIAGNOSIS — F902 Attention-deficit hyperactivity disorder, combined type: Secondary | ICD-10-CM | POA: Diagnosis not present

## 2023-11-13 DIAGNOSIS — F422 Mixed obsessional thoughts and acts: Secondary | ICD-10-CM | POA: Diagnosis not present

## 2023-11-13 DIAGNOSIS — F4310 Post-traumatic stress disorder, unspecified: Secondary | ICD-10-CM

## 2023-11-13 NOTE — Progress Notes (Signed)
 [facility] Counselor/Therapist Progress Note  Patient ID: Danielle Bruce, MRN: [id number],    Date:11/13/2023  Time Spent: 60 minutes Time In: 3:00  Time out:4:00  Treatment Type: Individual Therapy  Reported Symptoms: flashbacks, responding to triggers, depression, anxiety, being busy all of the time, dissociation  Mental Status Exam: Appearance:  Casual     Behavior: Appropriate  Motor: normal  Speech/Language:  Clear and Coherent  Affect: Blunt  Mood: pleasant  Thought process: normal  Thought content:   WNL  Sensory/Perceptual disturbances:   WNL  Orientation: oriented to person, place, time/date, and situation  Attention: Good  Concentration: Good  Memory: WNL  Fund of knowledge:  Good  Insight:   Good  Judgment:  Fair  Impulse Control: Good   Risk Assessment: Danger to Self:  No Self-injurious Behavior: No Danger to Others: No Duty to Warn:no Physical Aggression / Violence:No  Access to Firearms a concern: No  Gang Involvement:No   Subjective: The patient attended a face-to-face individual therapy in the office today.  The patient presents as pleasant and cooperative.  She reports that things are going well.  We talked about her health and she seems to be handling not knowing what is going on with her pretty well considering she previously would have been very anxious about it.  She states that she does feel like she has gotten so much better since she started therapy.  She did report that she is doing some meditation and mindfulness in her garden and that seems to be going well and we talked about other ways she could meditate so that she could have some options.  I want her to continue to medicate and then we will begin working on her trauma once I know she is able to calm herself down and not get caught up in a lot of emotional drama with her family.  Interventions: Cognitive Behavioral Therapy, Mindfulness Meditation, Eye Movement Desensitization  and Reprocessing (EMDR), Insight-Oriented, and Interpersonal, psychoeducation  Diagnosis:PTSD (post-traumatic stress disorder)  Attention deficit hyperactivity disorder (ADHD), combined type  Mixed obsessional thoughts and acts  Plan: Plan of Care: Client Abilities/Strengths  Insightful, motivated, Supportive family Client Treatment Preferences  Outpatient Individual therapy/EMDR  Client Statement of Needs  I think I have PTSD and I feel like I need another kind of therapy than talk therapy Treatment Level  Outpatient Individual therapy  Symptoms  Demonstrates an exaggerated startle response.:  (Status: maintained). Depressed  or irritable mood.: (Status:improved). Describes a reliving of the event,  particularly through dissociative flashbacks.: (Status: maintained). Displays a  significant decline in interest and engagement in activities.:  (Status: improved). Displays significant psychological and/or physiological distress resulting from internal and external  clues that are reminiscent of the traumatic event.: (Status: maintained).  Experiences disturbances in sleep.:  (Status: improved). Experiences disturbing  and persistent thoughts, images, and/or perceptions of the traumatic event.:  (Status: maintained). Experiences frequent nightmares.: (Status: maintained).  Feelings of hopelessness, worthlessness, or inappropriate guilt.: No Description Entered (Status:  improved). Has been exposed to a traumatic event involving actual or perceived threat of death or  serious injury.:  (Status: maintained). Impairment in social, occupational, or  other areas of functioning.:(Status: maintained). Intentionally avoids activities,  places, people, or objects (e.g., up-armored vehicles) that evoke memories of the event.: (Status: maintained). Intentionally avoids thoughts, feelings, or discussions related  to the traumatic event.: (Status: maintained). Reports difficulty concentrating as   well as feelings of guilt (Status:maintained). Reports response of intense fear,  helplessness, or horror to the traumatic event.:  (Status: maintained).  Problems Addressed  Unipolar Depression, Posttraumatic Stress Disorder (PTSD), Posttraumatic Stress Disorder (PTSD),   Posttraumatic Stress Disorder (PTSD), Posttraumatic Stress Disorder (PTSD)  Goals 1. Develop healthy thinking patterns and beliefs about self, others, and the world that lead to the alleviation and help prevent the relapse of  depression. Objective Identify and replace thoughts and beliefs that support depression. Target Date: 2024/01/04 Frequency: Weekly Progress: 60 Modality: individual Related Interventions 1. Explore and restructure underlying assumptions and beliefs reflected in biased self-talk that  may put the client at risk for relapse or recurrence. 2. Conduct Cognitive-Behavioral Therapy (see Cognitive Behavior Therapy by Almarie; Overcoming Depression by Marine dunker al.), beginning with helping the client learn the connection among  cognition, depressive feelings, and actions. 2. Eliminate or reduce the negative impact trauma related symptoms have  on social, occupational, and family functioning. Objective Learn and implement personal skills to manage challenging situations related to trauma. Target Date: 2024/01/04 Frequency: Weekly Progress: 20 Modality: individual 3. No longer avoids persons, places, activities, and objects that are  reminiscent of the traumatic event. Objective Participate in Eye Movement Desensitization and Reprocessing (EMDR) to reduce emotional distress  related to traumatic thoughts, feelings, and images. Target Date: 2024/01/04 Frequency: Weekly Progress: 0 Modality: individual   Related Interventions 1. Utilize Eye Movement Desensitization and Reprocessing (EMDR) to reduce the client's  emotional reactivity to the traumatic event and reduce PTSD symptoms. Objective Learn  and implement guided self-dialogue to manage thoughts, feelings, and urges brought on by  encounters with trauma-related situations. Target Date: 2025-11/07 Frequency: Weekly Progress: 20 Modality: individual Related Interventions 1. Teach the client a guided self-dialogue procedure in which he/she learns to recognize  maladaptive self-talk, challenges its biases, copes with engendered feelings, overcomes  avoidance, and reinforces his/her accomplishments; review and reinforce progress, problemsolve obstacles. 4. No longer experiences intrusive event recollections, avoidance of event  reminders, intense arousal, or disinterest in activities or  relationships. 5. Thinks about or openly discusses the traumatic event with others  without experiencing psychological or physiological distress. Diagnosis Axis  none F43.10 (Posttraumatic stress disorder) - Open - [Signifier: n/a] Posttraumatic Stress  Disorder  Conditions For Discharge Achievement of treatment goals and objectives   Lainie Daubert G Reinaldo Helt, LCSW

## 2023-11-14 ENCOUNTER — Ambulatory Visit

## 2023-11-14 DIAGNOSIS — M353 Polymyalgia rheumatica: Secondary | ICD-10-CM | POA: Diagnosis not present

## 2023-11-14 DIAGNOSIS — M6281 Muscle weakness (generalized): Secondary | ICD-10-CM | POA: Diagnosis not present

## 2023-11-14 DIAGNOSIS — R293 Abnormal posture: Secondary | ICD-10-CM

## 2023-11-14 DIAGNOSIS — R252 Cramp and spasm: Secondary | ICD-10-CM

## 2023-11-14 DIAGNOSIS — M542 Cervicalgia: Secondary | ICD-10-CM

## 2023-11-14 NOTE — Therapy (Signed)
 OUTPATIENT PHYSICAL THERAPY TREATMENT     Patient Name: Danielle Bruce MRN: [id number] DOB:October 18, 1960, 63 y.o., female Today's Date: 11/14/2023  Progress Note Reporting Period 09/19/23 to 11/14/23  See note below for Objective Data and Assessment of Progress/Goals.      PT End of Session - 11/14/23 1119     Visit Number 9    Date for PT Re-Evaluation 11/14/23    Authorization Type Medicare-KX needed    Progress Note Due on Visit 19    PT Start Time 1016    PT Stop Time 1059    PT Time Calculation (min) 43 min    Activity Tolerance Patient tolerated treatment well    Behavior During Therapy [facility] for tasks assessed/performed                       Past Medical History:  Diagnosis Date   Abdominal pain, unspecified site 10/18/2012   Anemia    Anxiety    Arthritis    osteoarthritis   ASCUS (atypical squamous cells of undetermined significance) on Pap smear 05/06/2005   NEG HIGH RISK HPV--C&B BIOPSY BENIGN 12/2005   Asthma    Bipolar 2 disorder (HCC)    Cancer (HCC)    skin cancer - basal cell   Colon polyps    Complication of anesthesia    anxious afterwards, will get headaches    Constipation    Depression    Fibromyalgia 10/2013   GERD (gastroesophageal reflux disease)    Hearing loss on left    Heart murmur    never had any problems   Hemorrhoids    High cholesterol    High risk HPV infection 08/2011   cytology negative   IBS (irritable bowel syndrome)    Insomnia    Lymphocytic colitis    Lymphoma (HCC)    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   Bone marrow biopsy showes 8% plasma cells IgA Lambda   Migraines    Nausea alone 10/18/2012   Osteoarthritis    Osteopenia    Peripheral neuropathy    PTSD (post-traumatic stress disorder)    Past Surgical History:  Procedure Laterality Date   BONE MARROW BIOPSY Left 12/18/2013   Plasma cell dyscrasia 8% population of plasma cells   BREAST BIOPSY Right    benign stereo    CESAREAN SECTION  13,11,06   CHOLECYSTECTOMY N/A 10/16/2012   Procedure: LAPAROSCOPIC CHOLECYSTECTOMY;  Surgeon: Debby LABOR. Cornett, MD;  Location: [facility];  Service: General;  Laterality: N/A;   COLONOSCOPY     numerous times   DILATION AND CURETTAGE OF UTERUS     ESOPHAGOGASTRODUODENOSCOPY     HEMORRHOID SURGERY  1993   x3   IUD REMOVAL  02/2015   Mirena   LAPAROSCOPIC LYSIS OF ADHESIONS N/A 10/16/2012   Procedure: LAPAROSCOPIC LYSIS OF ADHESIONS;  Surgeon: Debby A. Cornett, MD;  Location: [facility];  Service: General;  Laterality: N/A;   LAPAROSCOPY N/A 10/16/2012   Procedure: LAPAROSCOPY DIAGNOSTIC;  Surgeon: Debby LABOR. Cornett, MD;  Location: [facility];  Service: General;  Laterality: N/A;   PELVIC LAPAROSCOPY     RADIOLOGY WITH ANESTHESIA N/A 12/16/2015   Procedure: MRI OF BRAIN WITH AND WITHOUT CONTRAST;  Surgeon: Medication Radiologist, MD;  Location: [facility];  Service: Radiology;  Laterality: N/A;   SHOULDER SURGERY  2007/2008   SPINE SURGERY  2010   cervical   Patient Active Problem List   Diagnosis Date Noted  GAD (generalized anxiety disorder) 12/26/2017   OCD (obsessive compulsive disorder) 12/26/2017   PTSD (post-traumatic stress disorder) 12/26/2017   DDD (degenerative disc disease), cervical 07/14/2016   Primary osteoarthritis of both feet 07/14/2016   Primary osteoarthritis of both hands 07/14/2016   Other fatigue 07/14/2016   History of IBS 07/14/2016   Osteopenia of multiple sites 07/14/2016   Fibromyalgia 01/13/2016   MGUS (monoclonal gammopathy of unknown significance) 12/23/2013   Chronic cholecystitis without calculus 10/18/2012   Abdominal pain 10/18/2012   Nausea alone 10/18/2012   Constipation 10/18/2012   Depression 10/18/2012   Anxiety    ASCUS (atypical squamous cells of undetermined significance) on Pap smear    IUD    Hemorrhoids 11/29/2010   Abdominal pain, left upper quadrant 11/29/2010    PCP:  Joen Gentry, MD  REFERRING PROVIDER: Joen Gentry, MD  REFERRING DIAG: fibromyalgia  THERAPY DIAG:  Abnormal posture - Plan: PT plan of care cert/re-cert  Cramp and spasm - Plan: PT plan of care cert/re-cert  Muscle weakness (generalized) - Plan: PT plan of care cert/re-cert  Cervicalgia - Plan: PT plan of care cert/re-cert  Polymyalgia rheumatica (HCC) - Plan: PT plan of care cert/re-cert  Rationale for Evaluation and Treatment Rehabilitation  ONSET DATE: chronic pain (fibromyalgia), bowel flare up 1.5 months ago  SUBJECTIVE:                                                                                                                                                                                                         SUBJECTIVE STATEMENT:  Needling helped a lot last session and I just have some tension at the base of my skull today. 50% overall reduction in pain since the start of care.     PERTINENT HISTORY:  Anxiety, bipolar disorder, depression, fibromyalgia, IBS, migraines, osteopenia, PTSD  PAIN: 11/12/23 Are you having pain: Not much, just some random soreness Pain location:neck, upper trap, pecs Rt>Lt jaw  Pain rating: 5/10 Pain description: irritating, annoying   Aggravating factors: doing too much, stress  Relieving factors: muscle relaxers, rest, stretching  PRECAUTIONS: Other: chronic pain syndrome and depression Other: slow progression with exercise due to chronic condition  WEIGHT BEARING RESTRICTIONS No  FALLS:  Has patient fallen in last 6 months? No  LIVING ENVIRONMENT: Lives with: lives with their family Lives in: House/apartment  OCCUPATION: on disability  PLOF: Independent with basic ADLs and Leisure: yardwork, walking  Pt cares for her 4 young grandchildren- difficulty with care including lifting and carrying  PATIENT GOALS be more active with less  pain, exercise at the gym regularly, lift and carry grandchildren and laundry, improved ease with yardwork and housework, resume  exercise  OBJECTIVE:   DIAGNOSTIC FINDINGS: none recent   PATIENT SURVEYS:  The Patient-Specific Functional Scale  Initial:  I am going to ask you to identify up to 3 important activities that you are unable to do or are having difficulty with as a result of this problem.  Today are there any activities that you are unable to do or having difficulty with because of this?  (Patient shown scale and patient rated each activity)  Follow up: When you first came in you had difficulty performing these activities.  Today do you still have difficulty?  Patient-Specific activity scoring scheme (Point to one number):  0 1 2 3 4 5 6 7 8 9  10 Unable                                                                                                          Able to perform To perform                                                                                                    activity at the same Activity         Level as before                                                                                                                       Injury or problem  Activity   Lifting laundry and groceries/housework                           initial:  3/10  11/14/23: 5/10  2.    Yard work- up and down with squatting          Initial: 2/10  11/14/23: 3/10     COGNITION: Overall cognitive status: Within functional limits for tasks assessed  SENSATION: WFL  POSTURE: rounded shoulders and forward head  PALPATION: Diffuse palpable tenderness over bil neck, upper traps, thoracic, lumbar and gluteals with trigger points.    CERVICAL ROM:  Stiff at end range and pain on Rt with Lt motions.  All WFLs  UPPER EXTREMITY ROM: UE A/ROM is limited by 20% into flexion and abduction with pain at end range.  Hip flexibility is limited by 25% in all directions with pain in all directions.  UPPER EXTREMITY MMT: UE: 4+/5, LE 4+/5 except hip flexors 4-/5  TODAY'S TREATMENT:  11/14/23:  Nustep level 3  for 10 min- PT monitored and discussed pt status Ball roll outs 3 ways x10 each  Sit to stand: 5# parallel and staggered stance x10 each [employer]: 5# around cancer gym x2 laps Deep squat mechanics: use UE support with getting from low position and work to use more leg activation Manual: suboccipital release, mobs with movement and passive upper trap stretch   11/12/23 Nustep level 3 for 10 min- PT monitored and discussed pt status Ball roll outs 3 ways x10 each  Trigger Point Dry Needling  Initial Treatment: Pt instructed on Dry Needling rational, procedures, and possible side effects. Pt instructed to expect mild to moderate muscle soreness later in the day and/or into the next day.  Pt instructed in methods to reduce muscle soreness. Pt instructed to continue prescribed HEP. Because Dry Needling was performed over or adjacent to a lung field, pt was educated on S/S of pneumothorax and to seek immediate medical attention should they occur.  Patient was educated on signs and symptoms of infection and other risk factors and advised to seek medical attention should they occur.  Patient verbalized understanding of these instructions and education.   Patient Verbal Consent Given: Yes Education Handout Provided: Yes Muscles Treated: bil cervical multifidi, bil upper traps, Rt rhomboids, bil suboccipitals Electrical Stimulation Performed: No Treatment Response/Outcome: Utilized skilled palpation to identify bony landmarks and trigger points.  Able to illicit twitch response and muscle elongation.  Soft tissue mobilization to muscles needled following DN to further promote tissue elongation and decreased pain.        11/07/23 Nustep level 3 for 8 min- PT monitored and discussed pt status Supine on foam roll: pec stretch x 2 minutes, then red band: horizontal abduction and ER 2x10 Thoracic stretch with horizontal foam roll with diaphragmatic breathing and movement  Ball roll outs 3 ways  x10 each  Manual: elongation and release to suboccipital release  and UT release, mobs with movement to the neck    10/26/23:Pt arrives for aquatic physical therapy. Treatment took place in 3.5-5.5 feet of water. Water temperature was 91 degrees F. Pt entered the pool via stairs independently. Pt requires buoyancy of water for support and to offload joints with strengthening exercises.  Pt utilizes viscosity of the water required for strengthening. Seated water bench with 75% submersion Pt performed seated LE AROM exercises 20x in all planes for initial warm up and review of todays status. 75%-50% water depth for the following: water walking with UE push/pull with rainbow wts 6x each direction, hip kicks Bil 20x with added ankle fins.  high knee marching with single buoy floats by herside  lengths. Low back stretch holding onto rails at the ladder/feet in the wall ladder: VC to let her tailbone be heavy: 3x 3 -4 breaths. Horseback bicycle 6 min on yellow noodle. End session with 2 min seated decompression using yellow noodle.    HOME EXERCISE PROGRAM:  Access Code: [email or web address]- previously provided  Access Code: [email or web address] URL: [email or web address] Date: 09/19/2023 Prepared by: Burnard  Exercises - Seated Cervical Flexion AROM  - 3 x daily -  7 x weekly - 1 sets - 3 reps - 20 hold - Seated Cervical Sidebending AROM  - 3 x daily - 7 x weekly - 1 sets - 3 reps - 20 hold - Seated Cervical Rotation AROM  - 3 x daily - 7 x weekly - 1 sets - 3 reps - 20 hold - Seated Correct Posture  - 1 x daily - 7 x weekly - 3 sets - 10 reps - Seated Diaphragmatic Breathing  - 5 x daily - 7 x weekly - 1 sets - 10 reps - Sidelying Open Book Thoracic Rotation with Knee on Foam Roll  - 2 x daily - 7 x weekly - 3 sets - 10 reps - Seated Transversus Abdominis Bracing  - 5 x daily - 7 x weekly - 1 sets - 10 reps - 5 hold - Seated Hamstring Stretch  - 3 x daily - 7 x weekly - 1 sets - 3 reps - 20  hold ASSESSMENT:  CLINICAL IMPRESSION: Pt reports 50% overall reduction in pain since the start of care.  PSFS score for squatting with yardwork has improved in addition to housework.  Pt had good response to dry needling last session with significant reduction in tension.  She continues to have tension and headaches at base of skull and manual therapy performed to address this today.  Worked on mechanics for getting up from deep squat in the yard and she will practice at home.  Patient will benefit from skilled PT to address the below impairments and improve overall function.    OBJECTIVE IMPAIRMENTS decreased activity tolerance, decreased endurance, difficulty walking, decreased strength, increased muscle spasms, impaired flexibility, improper body mechanics, postural dysfunction, and pain.   ACTIVITY LIMITATIONS carrying, lifting, sitting, standing, reach over head, hygiene/grooming, and locomotion level  PARTICIPATION LIMITATIONS: meal prep, cleaning, laundry, driving, community activity, and yard work  PERSONAL FACTORS Past/current experiences, Time since onset of injury/illness/exacerbation, and 3+ comorbidities: fibromyalgia depression, anxiety,  are also affecting patient's functional outcome.   REHAB POTENTIAL: Good  CLINICAL DECISION MAKING: Evolving/moderate complexity  EVALUATION COMPLEXITY: Moderate   GOALS: Goals reviewed with patient? Yes  SHORT TERM GOALS: Target date: 10/17/2023    Return to regular performance of HEP 4-5x/week to improve mobility  Baseline: 3-5x/wk (met) Goal status: MET  2.  Rate housework > or = to 4/10 on patient specific functional scale (PSFS) Baseline: 5/10 Goal status: MET  3.  Verbalize and demonstrate abdominal massage daily to reduce GI pain Baseline:  Goal status: Met 8/11    LONG TERM GOALS: Target date: 02/04/24    Be independent in advanced HEP Baseline:  Goal status:  Ongoing  2.  Rate squatting for yard work > or = to  4/10 on PSFS Baseline: 3/10 (11/14/23) Goal status:NEW  3.  Report > or = to 70% reduction in neck and thoracic pain with daily tasks  Baseline: 50% (11/14/23) Goal status: Revised   4.  Walk for exercise and perform regular exercises 3-5x/wk for exercise to improve endurance  Baseline: some weeks, more consistent now (11/14/23) Goal status: in progress   5.  Rate housework > or = to 6/10 on PSFS  Baseline: 5/10  (11/14/23) Goal status: revised    PLAN: PT FREQUENCY: 1-2x/week  PT DURATION: 12 weeks  PLANNED INTERVENTIONS: 97164- PT Re-evaluation, 97110-Therapeutic exercises, 97530- Therapeutic activity, [id number]- Neuromuscular re-education, 97535- Self Care, [id number]- Manual therapy, [id number]- Gait training, [phone]- Aquatic Therapy, H9716- Electrical stimulation (unattended), [id number]- Electrical stimulation (manual), [id number]-  Traction (mechanical), 79439 (1-2 muscles), 20561 (3+ muscles)- Dry Needling, Patient/Family education, Balance training, Taping, Joint mobilization, Spinal manipulation, Spinal mobilization, Cryotherapy, Moist heat, Therapeutic exercises, Therapeutic activity, Neuromuscular re-education, and Self Care  PLAN FOR NEXT SESSION:: continued advancement of mobility, strength and manual to address tension   Burnard Joy, PT 11/14/23 11:40 AM

## 2023-11-16 ENCOUNTER — Ambulatory Visit (INDEPENDENT_AMBULATORY_CARE_PROVIDER_SITE_OTHER): Admitting: Audiology

## 2023-11-16 ENCOUNTER — Encounter (INDEPENDENT_AMBULATORY_CARE_PROVIDER_SITE_OTHER): Payer: Self-pay | Admitting: Otolaryngology

## 2023-11-16 ENCOUNTER — Ambulatory Visit (INDEPENDENT_AMBULATORY_CARE_PROVIDER_SITE_OTHER): Admitting: Otolaryngology

## 2023-11-16 VITALS — BP 131/76 | HR 74 | Temp 98.1°F | Wt 123.0 lb

## 2023-11-16 DIAGNOSIS — H6122 Impacted cerumen, left ear: Secondary | ICD-10-CM

## 2023-11-16 DIAGNOSIS — H903 Sensorineural hearing loss, bilateral: Secondary | ICD-10-CM

## 2023-11-16 NOTE — Progress Notes (Signed)
  [address] [place], [place] [address] [phone]  Audiological Evaluation    Name: GLENDALE WHERRY     DOB:   [birth year]      MRN:   [id number]                                                                                     Service Date: 11/16/2023     Accompanied by: unaccompanied   Patient comes today after Dr. Karis, ENT sent a referral for a hearing evaluation due to concerns with hearing loss.   Symptoms Yes Details  Hearing loss  [x]  Both ears  Tinnitus  [x]  Maybe more on the right ear, intermittent  Ear pain/ infections/pressure  []    Balance problems  [x]  Reports as very clumsy and links it to muscle weakness for which she goes to PT  Noise exposure history  []    Previous ear surgeries  []    Family history of hearing loss  [x]  Parents have hearing loss  Amplification  []    Other  [x]  Chronic migraines    Otoscopy: Right ear: Clear external ear canal and notable landmarks visualized on the tympanic membrane. Left ear:  Clear external ear canal and notable landmarks visualized on the tympanic membrane.  Tympanometry: Right ear: Type As- Normal external ear canal volume with normal middle ear pressure and low tympanic membrane compliance. Left ear: Type A- Normal external ear canal volume with normal middle ear pressure and tympanic membrane compliance.  Pure tone Audiometry: Both ears- Normal to mild sensorineural hearing loss from 125 Hz - 8000 Hz. Configuration of hearing loss in both ears has a cookie bite shape.   Speech Audiometry: Right ear- Speech Reception Threshold (SRT) was obtained at 35 dBHL. Left ear-Speech Reception Threshold (SRT) was obtained at 30 dBHL.   Word Recognition Score Tested using NU-6 (recorded) Right ear: 100% was obtained at a presentation level of 75 dBHL with contralateral masking which is deemed as  excellent. Left ear: 100% was obtained at a presentation level of 70 dBHL with contralateral masking which is  deemed as  excellent.   The hearing test results were completed under headphones and results are deemed to be of good reliability. Test technique:  conventional    Impression: There is not a significant difference in pure-tone thresholds between ears. There is not a significant difference in the word recognition score in between ears.    Recommendations: Follow up with ENT as scheduled for today. Return for a hearing evaluation if concerns with hearing changes arise or per MD recommendation. Consider a communication needs assessment after medical clearance for hearing aids is obtained.   Cheyla Duchemin MARIE LEROUX-MARTINEZ, AUD

## 2023-11-19 DIAGNOSIS — H903 Sensorineural hearing loss, bilateral: Secondary | ICD-10-CM | POA: Insufficient documentation

## 2023-11-19 NOTE — Progress Notes (Signed)
 CC: Progressive hearing loss  HPI:  Danielle Bruce is a 63 y.o. female who presents today complaining of bilateral progressive hearing loss for 6 to 7 years.  She is having increasing hearing difficulty, especially in noisy environments.  The patient denies any recent otitis media or otitis externa.  She has no previous otologic surgery.  Currently she denies any otalgia, otorrhea, or vertigo.  She has never worn hearing aids.  Past Medical History:  Diagnosis Date   Abdominal pain, unspecified site 10/18/2012   Anemia    Anxiety    Arthritis    osteoarthritis   ASCUS (atypical squamous cells of undetermined significance) on Pap smear 05/06/2005   NEG HIGH RISK HPV--C&B BIOPSY BENIGN 12/2005   Asthma    Bipolar 2 disorder (HCC)    Cancer (HCC)    skin cancer - basal cell   Colon polyps    Complication of anesthesia    anxious afterwards, will get headaches    Constipation    Depression    Fibromyalgia 10/2013   GERD (gastroesophageal reflux disease)    Hearing loss on left    Heart murmur    never had any problems   Hemorrhoids    High cholesterol    High risk HPV infection 08/2011   cytology negative   IBS (irritable bowel syndrome)    Insomnia    Lymphocytic colitis    Lymphoma (HCC)    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 Cornett, MD;  Location: [facility];  Service: General;  Laterality: N/A;   COLONOSCOPY     numerous times   DILATION AND  CURETTAGE OF UTERUS     ESOPHAGOGASTRODUODENOSCOPY     HEMORRHOID SURGERY  1993   x3   IUD REMOVAL  02/2015   Mirena   LAPAROSCOPIC LYSIS OF ADHESIONS N/A 10/16/2012   Procedure: LAPAROSCOPIC LYSIS OF ADHESIONS;  Surgeon: Debby A. Cornett, MD;  Location: [facility];  Service: General;  Laterality: N/A;   LAPAROSCOPY N/A 10/16/2012   Procedure: LAPAROSCOPY DIAGNOSTIC;  Surgeon: Debby LABOR. Cornett, MD;  Location: [facility];  Service: General;  Laterality: N/A;   PELVIC LAPAROSCOPY     RADIOLOGY WITH ANESTHESIA N/A 12/16/2015   Procedure: MRI OF BRAIN WITH AND WITHOUT CONTRAST;  Surgeon: Medication Radiologist, MD;  Location: [facility];  Service: Radiology;  Laterality: N/A;   SHOULDER SURGERY  2007/2008   SPINE SURGERY  2010   cervical    Family History  Problem Relation Age of Onset   Allergic rhinitis Mother    Allergic rhinitis Father    Diabetes Father    Hypertension Father    Hyperlipidemia Father    Allergic rhinitis Sister    Allergic rhinitis Brother    Allergic rhinitis Brother    Allergic rhinitis Maternal Aunt    Allergic rhinitis Maternal Uncle    Allergic rhinitis Paternal Aunt    Allergic rhinitis Paternal Uncle  Allergic rhinitis Maternal Grandmother    Allergic rhinitis Maternal Grandfather    Heart disease Maternal Grandfather    Allergic rhinitis Paternal Grandmother    Heart disease Paternal Grandmother    Allergic rhinitis Paternal Grandfather    Heart disease Paternal Grandfather    Allergic rhinitis Daughter    Depression Daughter    Anxiety disorder Daughter    Asthma Daughter    Atopy Son    Eczema Son    Allergic rhinitis Son    Depression Son    Atopy Son    Allergic rhinitis Son    Depression Son    Anxiety disorder Son    Breast cancer Neg Hx     Social History:  reports that she quit smoking about 9 years ago. Her smoking use included cigarettes. She has never used smokeless tobacco. She reports current alcohol use. She reports that she does not  use drugs.  Allergies:  Allergies  Allergen Reactions   Sulfa Antibiotics Nausea And Vomiting    Causes pt to vomit blood Other reaction(s): vomiting blood   Gluten Meal Other (See Comments)    Sensitivity.    Lactose Intolerance (Gi) Nausea And Vomiting   Aspirin     Other reaction(s): colitis   Bromfed Dm [Pseudoeph-Bromphen-Dm]     Other reaction(s): strung out and hallucinating   Chlorpromazine     Other reaction(s): Dark thoughts   Desipramine     Other reaction(s): rash   Gabapentin      Other reaction(s): swelling / fuzzy vision   Hydrocodone      Other reaction(s): causes hangovers   Lamotrigine      Other reaction(s): rash   Metronidazole     Other reaction(s): vomiting blood   Morphine Sulfate     Other reaction(s): tearful   Nsaids     Other reaction(s): colitis   Flagyl [Metronidazole Hcl] Nausea And Vomiting   Morphine And Codeine  Other (See Comments)    Reaction: Depression, emotional    Prior to Admission medications   Medication Sig Start Date End Date Taking? Authorizing Provider  albuterol  (PROVENTIL  HFA;VENTOLIN  HFA) 108 (90 Base) MCG/ACT inhaler Inhale 2 puffs into the lungs every 6 (six) hours as needed for wheezing or shortness of breath.   Yes [provider]  amphetamine -dextroamphetamine (ADDERALL XR) 10 MG 24 hr capsule Take 1 capsule (10 mg total) by mouth daily. 10/11/23  Yes Cottle, Lorene KANDICE Raddle., MD  azelastine  (ASTELIN ) 0.1 % nasal spray Place 1-2 sprays into both nostrils 2 (two) times daily as needed (nasal drainage). Use in each nostril as directed 11/01/22  Yes Luke Orlan HERO, DO  Calcium  Carb-Cholecalciferol (CALCIUM  1000 + D) 1000-20 MG-MCG TABS Take by mouth.   Yes [provider]  cetirizine  (ZYRTEC ) 10 MG tablet Take 1 tablet (10 mg total) by mouth daily. 11/01/22  Yes Luke Orlan HERO, DO  cyclobenzaprine (FLEXERIL) 10 MG tablet Take 10 mg by mouth at bedtime as needed. 05/13/19  Yes [provider]  diphenhydrAMINE   (BENADRYL ) 25 mg capsule Take 50 mg by mouth every 6 (six) hours as needed (HEADACHES).   Yes [provider]  EPINEPHrine  (EPIPEN  2-PAK) 0.3 mg/0.3 mL IJ SOAJ injection Inject 0.3 mg into the muscle as needed for anaphylaxis. 11/06/22  Yes Luke Orlan HERO, DO  Erenumab-aooe 70 MG/ML SOAJ Inject into the skin every 28 (twenty-eight) days.   Yes [provider]  fluticasone  (FLONASE ) 50 MCG/ACT nasal spray Place 1-2 sprays into both nostrils daily as needed (  nasal congestion). 11/01/22  Yes Luke Orlan HERO, DO  fluvoxaMINE  (LUVOX ) 25 MG tablet Take 3 tablets (75 mg total) by mouth at bedtime. 07/05/23  Yes Cottle, Lorene KANDICE Raddle., MD  hydrocortisone (ANUSOL-HC) 25 MG suppository 1 suppository 09/21/20  Yes [provider]  ibuprofen  (ADVIL ) 800 MG tablet 1 tablet with food or milk as needed 06/07/20  Yes [provider]  L-Methylfolate 15 MG TABS TAKE 1 TABLET BY MOUTH DAILY 05/31/23  Yes Cottle, Lorene KANDICE Raddle., MD  LORazepam  (ATIVAN ) 1 MG tablet TAKE 1 TABLET BY MOUTH EVERY 8 HOURS AS NEEDED FOR ANXIETY( TAKE 2 MG WHEN HAVING A MEDICAL PROCEDURE) 07/02/23  Yes Cottle, Lorene KANDICE Raddle., MD  NURTEC 75 MG TBDP Take 1 tablet by mouth daily as needed. 11/09/20  Yes [provider]  Olopatadine  HCl 0.2 % SOLN Apply 1 drop to eye daily as needed (itchy/watery eyes). 11/01/22  Yes Luke Orlan HERO, DO  oxyCODONE -acetaminophen  (PERCOCET/ROXICET) 5-325 MG per tablet Take 2 tablets by mouth daily as needed for moderate pain or severe pain (mighraine.).    Yes [provider]  polyethylene glycol (MIRALAX  / GLYCOLAX ) packet Take 17 g by mouth daily as needed for mild constipation.   Yes [provider]  PROLIA 60 MG/ML SOSY injection BRING TO THE OFFICE FOR INJECTION ON 02/15/2018 AS DIRECTED ONCE EVERY 6 MONTHS 01/31/18  Yes [provider]  promethazine  (PHENERGAN ) 25 MG tablet Take 25 mg by mouth every 6 (six) hours as needed for nausea.   Yes [provider]   rosuvastatin  (CRESTOR ) 10 MG tablet Take 1 tablet (10 mg total) by mouth daily. 01/04/23 11/16/23 Yes Kate Lonni CROME, MD  traZODone  (DESYREL ) 50 MG tablet TAKE 1 TABLET(50 MG) BY MOUTH AT BEDTIME 10/18/23  Yes Cottle, Lorene KANDICE Raddle., MD  VASCEPA 1 g CAPS TK 2 CS PO BID WC 07/21/16  Yes [provider]  VOLTAREN 1 % GEL Apply 2 g topically 4 (four) times daily as needed (JOINT PAIN).  07/06/14  Yes [provider]    Blood pressure 131/76, pulse 74, temperature 98.1 F (36.7 C), temperature source Oral, weight 123 lb (55.8 kg), last menstrual period 12/28/2010, SpO2 96%. Exam: General: Communicates without difficulty, well nourished, no acute distress. Head: Normocephalic, no evidence injury, no tenderness, facial buttresses intact without stepoff. Face/sinus: No tenderness to palpation and percussion. Facial movement is normal and symmetric. Eyes: PERRL, EOMI. No scleral icterus, conjunctivae clear. Neuro: CN II exam reveals vision grossly intact.  No nystagmus at any point of gaze. Ears: Auricles well formed without lesions.  Left ear cerumen impaction.  Nose: External evaluation reveals normal support and skin without lesions.  Dorsum is intact.  Anterior rhinoscopy reveals congested mucosa over anterior aspect of inferior turbinates and intact septum.  No purulence noted. Oral:  Oral cavity and oropharynx are intact, symmetric, without erythema or edema.  Mucosa is moist without lesions. Neck: Full range of motion without pain.  There is no significant lymphadenopathy.  No masses palpable.  Thyroid  bed within normal limits to palpation.  Parotid glands and submandibular glands equal bilaterally without mass.  Trachea is midline. Neuro:  CN 2-12 grossly intact.   Procedure: Left ear cerumen disimpaction Anesthesia: None Description: Under the operating microscope, the cerumen is carefully removed with a combination of cerumen currette, alligator forceps, and suction catheters.   After the cerumen is removed, the TMs are noted to be normal.  No mass, erythema, or lesions. The patient tolerated  the procedure well.    Her hearing test shows bilateral mild sensorineural hearing loss.  Assessment: 1.  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 2.  Incidental finding of left ear cerumen impaction.  After the disimpaction procedure, tympanic membrane semiviscous's are noted to be normal.  Plan: 1.  Otomicroscopy with left ear cerumen disimpaction. 2.  The physical exam findings and hearing test results are reviewed with the patient. 3.  The patient is a candidate for hearing amplification.  Hearing aid options are discussed. 4.  The patient will return for reevaluation in 1 year, sooner if needed.  Oneka Parada W Shatara Stanek 11/19/2023, 7:40 AM

## 2023-11-20 ENCOUNTER — Ambulatory Visit (INDEPENDENT_AMBULATORY_CARE_PROVIDER_SITE_OTHER): Admitting: Psychology

## 2023-11-20 DIAGNOSIS — F902 Attention-deficit hyperactivity disorder, combined type: Secondary | ICD-10-CM | POA: Diagnosis not present

## 2023-11-20 DIAGNOSIS — F422 Mixed obsessional thoughts and acts: Secondary | ICD-10-CM

## 2023-11-20 DIAGNOSIS — F4310 Post-traumatic stress disorder, unspecified: Secondary | ICD-10-CM | POA: Diagnosis not present

## 2023-11-21 ENCOUNTER — Ambulatory Visit

## 2023-11-21 DIAGNOSIS — R252 Cramp and spasm: Secondary | ICD-10-CM

## 2023-11-21 DIAGNOSIS — M6281 Muscle weakness (generalized): Secondary | ICD-10-CM | POA: Diagnosis not present

## 2023-11-21 DIAGNOSIS — M353 Polymyalgia rheumatica: Secondary | ICD-10-CM

## 2023-11-21 DIAGNOSIS — R293 Abnormal posture: Secondary | ICD-10-CM | POA: Diagnosis not present

## 2023-11-21 DIAGNOSIS — M542 Cervicalgia: Secondary | ICD-10-CM | POA: Diagnosis not present

## 2023-11-21 NOTE — Therapy (Signed)
 OUTPATIENT PHYSICAL THERAPY TREATMENT     Patient Name: Danielle Bruce MRN: [id number] DOB:[birth year], 63 y.o., female Today's Date: 11/21/2023       PT End of Session - 11/21/23 1324     Visit Number 10    Date for Recertification  02/04/24    Authorization Type Medicare-KX needed    Progress Note Due on Visit 19    PT Start Time 1234    PT Stop Time 1315    PT Time Calculation (min) 41 min    Activity Tolerance Patient tolerated treatment well    Behavior During Therapy [facility] for tasks assessed/performed                        Past Medical History:  Diagnosis Date   Abdominal pain, unspecified site 10/18/2012   Anemia    Anxiety    Arthritis    osteoarthritis   ASCUS (atypical squamous cells of undetermined significance) on Pap smear 05/06/2005   NEG HIGH RISK HPV--C&B BIOPSY BENIGN 12/2005   Asthma    Bipolar 2 disorder (HCC)    Cancer (HCC)    skin cancer - basal cell   Colon polyps    Complication of anesthesia    anxious afterwards, will get headaches    Constipation    Depression    Fibromyalgia 10/2013   GERD (gastroesophageal reflux disease)    Hearing loss on left    Heart murmur    never had any problems   Hemorrhoids    High cholesterol    High risk HPV infection 08/2011   cytology negative   IBS (irritable bowel syndrome)    Insomnia    Lymphocytic colitis    Lymphoma (HCC)    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   Bone marrow biopsy showes 8% plasma cells IgA Lambda   Migraines    Nausea alone 10/18/2012   Osteoarthritis    Osteopenia    Peripheral neuropathy    PTSD (post-traumatic stress disorder)    Past Surgical History:  Procedure Laterality Date   BONE MARROW BIOPSY Left 12/18/2013   Plasma cell dyscrasia 8% population of plasma cells   BREAST BIOPSY Right    benign stereo   CESAREAN SECTION  13,11,06   CHOLECYSTECTOMY N/A 10/16/2012   Procedure: LAPAROSCOPIC CHOLECYSTECTOMY;  Surgeon:  Debby LABOR. Cornett, MD;  Location: [facility];  Service: General;  Laterality: N/A;   COLONOSCOPY     numerous times   DILATION AND CURETTAGE OF UTERUS     ESOPHAGOGASTRODUODENOSCOPY     HEMORRHOID SURGERY  1993   x3   IUD REMOVAL  02/2015   Mirena   LAPAROSCOPIC LYSIS OF ADHESIONS N/A 10/16/2012   Procedure: LAPAROSCOPIC LYSIS OF ADHESIONS;  Surgeon: Debby A. Cornett, MD;  Location: [facility];  Service: General;  Laterality: N/A;   LAPAROSCOPY N/A 10/16/2012   Procedure: LAPAROSCOPY DIAGNOSTIC;  Surgeon: Debby LABOR. Cornett, MD;  Location: [facility];  Service: General;  Laterality: N/A;   PELVIC LAPAROSCOPY     RADIOLOGY WITH ANESTHESIA N/A 12/16/2015   Procedure: MRI OF BRAIN WITH AND WITHOUT CONTRAST;  Surgeon: Medication Radiologist, MD;  Location: [facility];  Service: Radiology;  Laterality: N/A;   SHOULDER SURGERY  2007/2008   SPINE SURGERY  2010   cervical   Patient Active Problem List   Diagnosis Date Noted   Sensorineural hearing loss, bilateral 11/19/2023   GAD (generalized anxiety disorder) 12/26/2017   OCD (obsessive compulsive  disorder) 12/26/2017   PTSD (post-traumatic stress disorder) 12/26/2017   DDD (degenerative disc disease), cervical 07/14/2016   Primary osteoarthritis of both feet 07/14/2016   Primary osteoarthritis of both hands 07/14/2016   Other fatigue 07/14/2016   History of IBS 07/14/2016   Osteopenia of multiple sites 07/14/2016   Fibromyalgia 01/13/2016   MGUS (monoclonal gammopathy of unknown significance) 12/23/2013   Chronic cholecystitis without calculus 10/18/2012   Abdominal pain 10/18/2012   Nausea alone 10/18/2012   Constipation 10/18/2012   Depression 10/18/2012   Anxiety    ASCUS (atypical squamous cells of undetermined significance) on Pap smear    IUD    Hemorrhoids 11/29/2010   Abdominal pain, left upper quadrant 11/29/2010    PCP:  Joen Gentry, MD  REFERRING PROVIDER: Joen Gentry, MD  REFERRING DIAG: fibromyalgia  THERAPY DIAG:   Abnormal posture  Cramp and spasm  Muscle weakness (generalized)  Cervicalgia  Polymyalgia rheumatica  Rationale for Evaluation and Treatment Rehabilitation  ONSET DATE: chronic pain (fibromyalgia), bowel flare up 1.5 months ago  SUBJECTIVE:                                                                                                                                                                                                         SUBJECTIVE STATEMENT:  My pain isn't too bad today.  I feel like I might be starting to get sick.  My shoulders are sore after working in the yard.    PERTINENT HISTORY:  Anxiety, bipolar disorder, depression, fibromyalgia, IBS, migraines, osteopenia, PTSD  PAIN: 11/21/23 Are you having pain: Not much, just some random soreness Pain location:neck, upper trap, pecs Rt>Lt jaw  Pain rating: 2/10 Pain description: irritating, annoying   Aggravating factors: doing too much, stress  Relieving factors: muscle relaxers, rest, stretching  PRECAUTIONS: Other: chronic pain syndrome and depression Other: slow progression with exercise due to chronic condition  WEIGHT BEARING RESTRICTIONS No  FALLS:  Has patient fallen in last 6 months? No  LIVING ENVIRONMENT: Lives with: lives with their family Lives in: House/apartment  OCCUPATION: on disability  PLOF: Independent with basic ADLs and Leisure: yardwork, walking  Pt cares for her 4 young grandchildren- difficulty with care including lifting and carrying  PATIENT GOALS be more active with less pain, exercise at the gym regularly, lift and carry grandchildren and laundry, improved ease with yardwork and housework, resume exercise  OBJECTIVE:   DIAGNOSTIC FINDINGS: none recent   PATIENT SURVEYS:  The Patient-Specific Functional Scale  Initial:  I am going to ask you to identify up  to 3 important activities that you are unable to do or are having difficulty with as a result of this problem.   Today are there any activities that you are unable to do or having difficulty with because of this?  (Patient shown scale and patient rated each activity)  Follow up: When you first came in you had difficulty performing these activities.  Today do you still have difficulty?  Patient-Specific activity scoring scheme (Point to one number):  0 1 2 3 4 5 6 7 8 9  10 Unable                                                                                                          Able to perform To perform                                                                                                    activity at the same Activity         Level as before                                                                                                                       Injury or problem  Activity   Lifting laundry and groceries/housework                           initial:  3/10  11/14/23: 5/10  2.    Yard work- up and down with squatting          Initial: 2/10  11/14/23: 3/10     COGNITION: Overall cognitive status: Within functional limits for tasks assessed  SENSATION: WFL  POSTURE: rounded shoulders and forward head  PALPATION: Diffuse palpable tenderness over bil neck, upper traps, thoracic, lumbar and gluteals with trigger points.    CERVICAL ROM:   Stiff at end range and pain on Rt with Lt motions.  All WFLs  UPPER EXTREMITY ROM: UE A/ROM is limited by 20% into flexion and abduction with pain at end range.  Hip flexibility is limited by 25% in all directions with pain in all  directions.  UPPER EXTREMITY MMT: UE: 4+/5, LE 4+/5 except hip flexors 4-/5  TODAY'S TREATMENT:  11/21/23:  Nustep level 3 for 10 min- PT monitored and discussed pt status Biceps curls: 6# 2x10  Ball roll outs 3 ways x10 each  Standing hamstring stretch on power plate  Seated on ball: 3# 3 way raises 2x10 Seated overhead press: 3# 2x10 Sit to stand: 5# parallel and staggered stance x10 each-with high pull [employer] Car[employer]: 5# around cancer gym x2 laps each hand   11/14/23:  Nustep level 3 for 10 min- PT monitored and discussed pt status Ball roll outs 3 ways x10 each  Sit to stand: 5# parallel and staggered stance x10 each [employer]: 5# around cancer gym x2 laps Deep squat mechanics: use UE support with getting from low position and work to use more leg activation Manual: suboccipital release, mobs with movement and passive upper trap stretch   11/12/23 Nustep level 3 for 10 min- PT monitored and discussed pt status Ball roll outs 3 ways x10 each  Trigger Point Dry Needling  Initial Treatment: Pt instructed on Dry Needling rational, procedures, and possible side effects. Pt instructed to expect mild to moderate muscle soreness later in the day and/or into the next day.  Pt instructed in methods to reduce muscle soreness. Pt instructed to continue prescribed HEP. Because Dry Needling was performed over or adjacent to a lung field, pt was educated on S/S of pneumothorax and to seek immediate  medical attention should they occur.  Patient was educated on signs and symptoms of infection and other risk factors and advised to seek medical attention should they occur.  Patient verbalized understanding of these instructions and education.   Patient Verbal Consent Given: Yes Education Handout Provided: Yes Muscles Treated: bil cervical multifidi, bil upper traps, Rt rhomboids, bil suboccipitals Electrical Stimulation Performed: No Treatment Response/Outcome: Utilized skilled palpation to identify bony landmarks and trigger points.  Able to illicit twitch response and muscle elongation.  Soft tissue mobilization to muscles needled following DN to further promote tissue elongation and decreased pain.        11/07/23 Nustep level 3 for 8 min- PT monitored and discussed pt status Supine on foam roll: pec stretch x 2 minutes, then red band: horizontal abduction and ER 2x10 Thoracic stretch with horizontal foam roll with diaphragmatic breathing and movement  Ball roll outs 3 ways x10 each  Manual: elongation and release to suboccipital release  and UT release, mobs with movement to the neck    HOME EXERCISE PROGRAM:  Access Code: [email or web address]- previously provided  Access Code: [email or web address] URL: [email or web address] Date: 09/19/2023 Prepared by: Burnard  Exercises - Seated Cervical Flexion AROM  - 3 x daily - 7 x weekly - 1 sets - 3 reps - 20 hold - Seated Cervical Sidebending AROM  - 3 x daily - 7 x weekly - 1 sets - 3 reps - 20 hold - Seated Cervical Rotation AROM  - 3 x daily - 7 x weekly - 1 sets - 3 reps - 20 hold - Seated Correct Posture  - 1 x daily - 7 x weekly - 3 sets - 10 reps - Seated Diaphragmatic Breathing  - 5 x daily - 7 x weekly - 1 sets - 10 reps - Sidelying Open Book Thoracic Rotation with Knee on Foam Roll  - 2 x daily - 7 x weekly - 3 sets - 10 reps - Seated Transversus Abdominis Bracing  - 5 x daily - 7 x weekly - 1 sets - 10 reps - 5 hold - Seated  Hamstring Stretch  - 3 x daily - 7 x weekly - 1 sets - 3 reps - 20 hold ASSESSMENT:  CLINICAL IMPRESSION: Pt reports 50% overall reduction in pain since the start of care.  She has been working in her yard and reports improved ability to get up from deep squat. No significant pain reported today and she was able to exercise without limitation. PT monitored throughout session for fatigue, pain and technique.   Patient will benefit from skilled PT to address the below impairments and improve overall function.    OBJECTIVE IMPAIRMENTS decreased activity tolerance, decreased endurance, difficulty walking, decreased strength, increased muscle spasms, impaired flexibility, improper body mechanics, postural dysfunction, and pain.   ACTIVITY LIMITATIONS carrying, lifting, sitting, standing, reach over head, hygiene/grooming, and locomotion level  PARTICIPATION LIMITATIONS: meal prep, cleaning, laundry, driving, community activity, and yard work  PERSONAL FACTORS Past/current experiences, Time since onset of injury/illness/exacerbation, and 3+ comorbidities: fibromyalgia depression, anxiety,  are also affecting patient's functional outcome.   REHAB POTENTIAL: Good  CLINICAL DECISION MAKING: Evolving/moderate complexity  EVALUATION COMPLEXITY: Moderate   GOALS: Goals reviewed with patient? Yes  SHORT TERM GOALS: Target date: 10/17/2023    Return to regular performance of HEP 4-5x/week to improve mobility  Baseline: 3-5x/wk (met) Goal status: MET  2.  Rate housework > or = to 4/10 on patient specific functional scale (PSFS) Baseline: 5/10 Goal status: MET  3.  Verbalize and demonstrate abdominal massage daily to reduce GI pain Baseline:  Goal status: Met 8/11    LONG TERM GOALS: Target date: 02/04/24    Be independent in advanced HEP Baseline:  Goal status:  Ongoing  2.  Rate squatting for yard work > or = to 4/10 on PSFS Baseline: 3/10 (11/14/23) Goal status:NEW  3.  Report > or  = to 70% reduction in neck and thoracic pain with daily tasks  Baseline: 50% (11/14/23) Goal status: Revised   4.  Walk for exercise and perform regular exercises 3-5x/wk for exercise to improve endurance  Baseline: some weeks, more consistent now (11/14/23) Goal status: in progress   5.  Rate housework > or = to 6/10 on PSFS  Baseline: 5/10  (11/14/23) Goal status: revised    PLAN: PT FREQUENCY: 1-2x/week  PT DURATION: 12 weeks  PLANNED INTERVENTIONS: 97164- PT Re-evaluation, 97110-Therapeutic exercises, 97530- Therapeutic activity, 97112- Neuromuscular re-education, 97535- Self Care, [id number]- Manual therapy, [phone]- Gait training, [phone]- Aquatic Therapy, [phone]- Electrical stimulation (unattended), [phone]- Electrical stimulation (manual), [id number]- Traction (mechanical), 20560 (1-2 muscles), 20561 (3+ muscles)- Dry Needling, Patient/Family education, Balance training, Taping, Joint mobilization, Spinal manipulation, Spinal mobilization, Cryotherapy, Moist heat, Therapeutic exercises, Therapeutic activity, Neuromuscular re-education, and Self Care  PLAN FOR NEXT SESSION:: continued advancement of mobility, strength and manual to address tension   Burnard Joy, PT 11/21/23 1:26 PM
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
## 2023-11-22 DIAGNOSIS — K589 Irritable bowel syndrome without diarrhea: Secondary | ICD-10-CM | POA: Diagnosis not present

## 2023-11-22 DIAGNOSIS — J988 Other specified respiratory disorders: Secondary | ICD-10-CM | POA: Diagnosis not present

## 2023-11-22 DIAGNOSIS — J45909 Unspecified asthma, uncomplicated: Secondary | ICD-10-CM | POA: Diagnosis not present

## 2023-11-22 DIAGNOSIS — K760 Fatty (change of) liver, not elsewhere classified: Secondary | ICD-10-CM | POA: Diagnosis not present

## 2023-11-22 DIAGNOSIS — M797 Fibromyalgia: Secondary | ICD-10-CM | POA: Diagnosis not present

## 2023-11-22 DIAGNOSIS — F411 Generalized anxiety disorder: Secondary | ICD-10-CM | POA: Diagnosis not present

## 2023-11-22 DIAGNOSIS — R051 Acute cough: Secondary | ICD-10-CM | POA: Diagnosis not present

## 2023-11-22 DIAGNOSIS — F4310 Post-traumatic stress disorder, unspecified: Secondary | ICD-10-CM | POA: Diagnosis not present

## 2023-11-23 ENCOUNTER — Other Ambulatory Visit: Payer: Self-pay | Admitting: Family Medicine

## 2023-11-23 DIAGNOSIS — K760 Fatty (change of) liver, not elsewhere classified: Secondary | ICD-10-CM

## 2023-11-27 ENCOUNTER — Ambulatory Visit: Admitting: Psychology

## 2023-11-28 ENCOUNTER — Encounter: Payer: Self-pay | Admitting: Physical Therapy

## 2023-11-28 ENCOUNTER — Ambulatory Visit: Payer: Self-pay | Attending: Family Medicine | Admitting: Physical Therapy

## 2023-11-28 DIAGNOSIS — M79671 Pain in right foot: Secondary | ICD-10-CM | POA: Diagnosis not present

## 2023-11-28 DIAGNOSIS — M353 Polymyalgia rheumatica: Secondary | ICD-10-CM | POA: Insufficient documentation

## 2023-11-28 DIAGNOSIS — M79672 Pain in left foot: Secondary | ICD-10-CM | POA: Insufficient documentation

## 2023-11-28 DIAGNOSIS — R293 Abnormal posture: Secondary | ICD-10-CM | POA: Insufficient documentation

## 2023-11-28 DIAGNOSIS — M6281 Muscle weakness (generalized): Secondary | ICD-10-CM | POA: Diagnosis not present

## 2023-11-28 DIAGNOSIS — R252 Cramp and spasm: Secondary | ICD-10-CM | POA: Diagnosis not present

## 2023-11-28 NOTE — Therapy (Signed)
 OUTPATIENT PHYSICAL THERAPY TREATMENT     Patient Name: Danielle Bruce MRN: [id number] DOB:[birth year], 63 y.o., female Today's Date: 11/28/2023       PT End of Session - 11/28/23 1022     Visit Number 11    Date for Recertification  02/04/24    Authorization Type Medicare-KX needed    Progress Note Due on Visit 19    PT Start Time 1020    PT Stop Time 1100    PT Time Calculation (min) 40 min    Activity Tolerance Patient tolerated treatment well    Behavior During Therapy [facility] for tasks assessed/performed                         Past Medical History:  Diagnosis Date   Abdominal pain, unspecified site 10/18/2012   Anemia    Anxiety    Arthritis    osteoarthritis   ASCUS (atypical squamous cells of undetermined significance) on Pap smear 05/06/2005   NEG HIGH RISK HPV--C&B BIOPSY BENIGN 12/2005   Asthma    Bipolar 2 disorder (HCC)    Cancer (HCC)    skin cancer - basal cell   Colon polyps    Complication of anesthesia    anxious afterwards, will get headaches    Constipation    Depression    Fibromyalgia 10/2013   GERD (gastroesophageal reflux disease)    Hearing loss on left    Heart murmur    never had any problems   Hemorrhoids    High cholesterol    High risk HPV infection 08/2011   cytology negative   IBS (irritable bowel syndrome)    Insomnia    Lymphocytic colitis    Lymphoma (HCC)    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   Bone marrow biopsy showes 8% plasma cells IgA Lambda   Migraines    Nausea alone 10/18/2012   Osteoarthritis    Osteopenia    Peripheral neuropathy    PTSD (post-traumatic stress disorder)    Past Surgical History:  Procedure Laterality Date   BONE MARROW BIOPSY Left 12/18/2013   Plasma cell dyscrasia 8% population of plasma cells   BREAST BIOPSY Right    benign stereo   CESAREAN SECTION  13,11,06   CHOLECYSTECTOMY N/A 10/16/2012   Procedure: LAPAROSCOPIC CHOLECYSTECTOMY;  Surgeon:  Debby LABOR. Cornett, MD;  Location: [facility];  Service: General;  Laterality: N/A;   COLONOSCOPY     numerous times   DILATION AND CURETTAGE OF UTERUS     ESOPHAGOGASTRODUODENOSCOPY     HEMORRHOID SURGERY  1993   x3   IUD REMOVAL  02/2015   Mirena   LAPAROSCOPIC LYSIS OF ADHESIONS N/A 10/16/2012   Procedure: LAPAROSCOPIC LYSIS OF ADHESIONS;  Surgeon: Debby A. Cornett, MD;  Location: [facility];  Service: General;  Laterality: N/A;   LAPAROSCOPY N/A 10/16/2012   Procedure: LAPAROSCOPY DIAGNOSTIC;  Surgeon: Debby LABOR. Cornett, MD;  Location: [facility];  Service: General;  Laterality: N/A;   PELVIC LAPAROSCOPY     RADIOLOGY WITH ANESTHESIA N/A 12/16/2015   Procedure: MRI OF BRAIN WITH AND WITHOUT CONTRAST;  Surgeon: Medication Radiologist, MD;  Location: [facility];  Service: Radiology;  Laterality: N/A;   SHOULDER SURGERY  2007/2008   SPINE SURGERY  2010   cervical   Patient Active Problem List   Diagnosis Date Noted   Sensorineural hearing loss, bilateral 11/19/2023   GAD (generalized anxiety disorder) 12/26/2017   OCD (obsessive  compulsive disorder) 12/26/2017   PTSD (post-traumatic stress disorder) 12/26/2017   DDD (degenerative disc disease), cervical 07/14/2016   Primary osteoarthritis of both feet 07/14/2016   Primary osteoarthritis of both hands 07/14/2016   Other fatigue 07/14/2016   History of IBS 07/14/2016   Osteopenia of multiple sites 07/14/2016   Fibromyalgia 01/13/2016   MGUS (monoclonal gammopathy of unknown significance) 12/23/2013   Chronic cholecystitis without calculus 10/18/2012   Abdominal pain 10/18/2012   Nausea alone 10/18/2012   Constipation 10/18/2012   Depression 10/18/2012   Anxiety    ASCUS (atypical squamous cells of undetermined significance) on Pap smear    IUD    Hemorrhoids 11/29/2010   Abdominal pain, left upper quadrant 11/29/2010    PCP:  Joen Gentry, MD  REFERRING PROVIDER: Joen Gentry, MD  REFERRING DIAG: fibromyalgia  THERAPY DIAG:   Abnormal posture  Cramp and spasm  Muscle weakness (generalized)  Polymyalgia rheumatica  Pain in right foot  Pain in left foot  Rationale for Evaluation and Treatment Rehabilitation  ONSET DATE: chronic pain (fibromyalgia), bowel flare up 1.5 months ago  SUBJECTIVE:                                                                                                                                                                                                         SUBJECTIVE STATEMENT:  Good day today, no new complaints.   PERTINENT HISTORY:  Anxiety, bipolar disorder, depression, fibromyalgia, IBS, migraines, osteopenia, PTSD  PAIN: 11/21/23 Are you having pain: Not really Pain location  Pain rating:  Pain description: irritating, annoying   Aggravating factors: doing too much, stress  Relieving factors: muscle relaxers, rest, stretching  PRECAUTIONS: Other: chronic pain syndrome and depression Other: slow progression with exercise due to chronic condition  WEIGHT BEARING RESTRICTIONS No  FALLS:  Has patient fallen in last 6 months? No  LIVING ENVIRONMENT: Lives with: lives with their family Lives in: House/apartment  OCCUPATION: on disability  PLOF: Independent with basic ADLs and Leisure: yardwork, walking  Pt cares for her 4 young grandchildren- difficulty with care including lifting and carrying  PATIENT GOALS be more active with less pain, exercise at the gym regularly, lift and carry grandchildren and laundry, improved ease with yardwork and housework, resume exercise  OBJECTIVE:   DIAGNOSTIC FINDINGS: none recent   PATIENT SURVEYS:  The Patient-Specific Functional Scale  Initial:  I am going to ask you to identify up to 3 important activities that you are unable to do or are having difficulty with as a result of this problem.  Today are there any activities that you are unable to do or having difficulty with because of this?  (Patient shown scale and  patient rated each activity)  Follow up: When you first came in you had difficulty performing these activities.  Today do you still have difficulty?  Patient-Specific activity scoring scheme (Point to one number):  0 1 2 3 4 5 6 7 8 9  10 Unable                                                                                                          Able to perform To perform                                                                                                    activity at the same Activity         Level as before                                                                                                                       Injury or problem  Activity   Lifting laundry and groceries/housework                           initial:  3/10  11/14/23: 5/10  2.    Yard work- up and down with squatting          Initial: 2/10  11/14/23: 3/10     COGNITION: Overall cognitive status: Within functional limits for tasks assessed  SENSATION: WFL  POSTURE: rounded shoulders and forward head  PALPATION: Diffuse palpable tenderness over bil neck, upper traps, thoracic, lumbar and gluteals with trigger points.    CERVICAL ROM:   Stiff at end range and pain on Rt with Lt motions.  All WFLs  UPPER EXTREMITY ROM: UE A/ROM is limited by 20% into flexion and abduction with pain at end range.  Hip flexibility is limited by 25% in all directions with pain in all directions.  UPPER EXTREMITY MMT: UE: 4+/5, LE 4+/5 except hip flexors 4-/5  TODAY'S TREATMENT:   11/28/23:Pt arrives for aquatic  physical therapy. Treatment took place in 3.5-5.5 feet of water. Water temperature was 91 degres F. Pt entered the pool via stairs independently.  -50% depth water walking with UE push pull with yellow floats 10x each direction. VC to walk as fast as she could to create a good resistive current.Ankle fins added - High knee march 6x holding rainbow floats by her side. Vc to keep heart lifted.  -Bil  hip lifts 3 directions with ankle fins 20x each -Pink bells core work: static stance arms forward and back 2x20 -Standing against wall core/lat press with 2 rainbow floats 3x10 -Horseback bicycle on yellow 6 min, ankle fins  11/21/23:  Nustep level 3 for 10 min- PT monitored and discussed pt status Biceps curls: 6# 2x10  Ball roll outs 3 ways x10 each  Standing hamstring stretch on power plate  Seated on ball: 3# 3 way raises 2x10 Seated overhead press: 3# 2x10 Sit to stand: 5# parallel and staggered stance x10 each-with high pull Farmer[employer] Carry: 5# around cancer gym x2 laps each hand   11/14/23:  Nustep level 3 for 10 min- PT monitored and discussed pt status Ball roll outs 3 ways x10 each  Sit to stand: 5# parallel and staggered stance x10 each [employer] Carry: 5# around cancer gym x2 laps Deep squat mechanics: use UE support with getting from low position and work to use more leg activation Manual: suboccipital release, mobs with movement and passive upper trap stretch     HOME EXERCISE  PROGRAM:  Access Code: [email or web address]- previously provided  Access Code: [email or web address] URL: [email or web address] Date: 09/19/2023 Prepared by: Burnard  Exercises - Seated Cervical Flexion AROM  - 3 x daily - 7 x weekly - 1 sets - 3 reps - 20 hold - Seated Cervical Sidebending AROM  - 3 x daily - 7 x weekly - 1 sets - 3 reps - 20 hold - Seated Cervical Rotation AROM  - 3 x daily - 7 x weekly - 1 sets - 3 reps - 20 hold - Seated Correct Posture  - 1 x daily - 7 x weekly - 3 sets - 10 reps - Seated Diaphragmatic Breathing  - 5 x daily - 7 x weekly - 1 sets - 10 reps - Sidelying Open Book Thoracic Rotation with Knee on Foam Roll  - 2 x daily - 7 x weekly - 3 sets - 10 reps - Seated Transversus Abdominis Bracing  - 5 x daily - 7 x weekly - 1 sets - 10 reps - 5 hold - Seated Hamstring Stretch  - 3 x daily - 7 x weekly - 1 sets - 3 reps - 20 hold ASSESSMENT:  CLINICAL IMPRESSION: Pt doing very well with her core stabilization exercises in the water. Pt can now perform her core press in standing using 2 floats vs 1. No pain while exercising. Pt able to maintain a very good intensity throughout her entire session, no fatigue at all.     OBJECTIVE IMPAIRMENTS decreased activity tolerance, decreased endurance, difficulty walking, decreased strength, increased muscle spasms, impaired flexibility, improper body mechanics, postural dysfunction, and pain.   ACTIVITY LIMITATIONS carrying, lifting, sitting, standing, reach over head, hygiene/grooming, and locomotion level  PARTICIPATION LIMITATIONS: meal prep, cleaning, laundry, driving, community activity, and yard work  PERSONAL FACTORS Past/current experiences, Time since onset of injury/illness/exacerbation, and 3+ comorbidities: fibromyalgia depression, anxiety,  are also affecting patient's functional outcome.   REHAB POTENTIAL: Good  CLINICAL DECISION MAKING: Evolving/moderate complexity  EVALUATION COMPLEXITY: Moderate   GOALS: Goals  reviewed with patient? Yes  SHORT TERM GOALS: Target date: 10/17/2023    Return to regular performance of HEP 4-5x/week to improve mobility  Baseline: 3-5x/wk (met) Goal status: MET  2.  Rate housework > or = to 4/10 on patient specific functional scale (PSFS) Baseline: 5/10 Goal status: MET  3.  Verbalize and demonstrate abdominal massage daily to reduce GI pain Baseline:  Goal status: Met 8/11    LONG TERM GOALS: Target date: 02/04/24    Be independent in advanced HEP Baseline:  Goal status:  Ongoing  2.  Rate squatting for yard work > or = to 4/10 on PSFS Baseline: 3/10 (11/14/23) Goal status:NEW  3.  Report > or = to 70% reduction in neck and thoracic pain with daily tasks  Baseline: 50% (11/14/23) Goal status: Revised  4.  Walk for exercise and perform regular exercises 3-5x/wk for exercise to improve endurance  Baseline: some weeks, more consistent now (11/14/23) Goal status: in progress   5.  Rate housework > or = to 6/10 on PSFS  Baseline: 5/10  (11/14/23) Goal status: revised    PLAN: PT FREQUENCY: 1-2x/week  PT DURATION: 12 weeks  PLANNED INTERVENTIONS: [id number]- PT Re-evaluation, 97110-Therapeutic exercises, 97530- Therapeutic activity, 97112- Neuromuscular re-education, 97535- Self Care, [id number]- Manual therapy, [phone]- Gait training, [phone]- Aquatic Therapy, [phone]- Electrical stimulation (unattended), [phone]- Electrical stimulation (manual), [id number]- Traction (mechanical), 20560 (1-2 muscles), 20561 (3+ muscles)- Dry Needling, Patient/Family education, Balance training, Taping, Joint mobilization, Spinal manipulation, Spinal mobilization, Cryotherapy, Moist heat, Therapeutic exercises, Therapeutic activity, Neuromuscular re-education, and Self Care  PLAN FOR NEXT SESSION:: continued advancement of mobility, strength and manual to address tension   Delon Darner, PTA 11/28/23 10:55 AM

## 2023-11-29 ENCOUNTER — Telehealth: Payer: Self-pay | Admitting: Allergy

## 2023-11-29 NOTE — Telephone Encounter (Signed)
 Pt called and requested a call about the supplies for her nebulizer.

## 2023-11-30 NOTE — Telephone Encounter (Signed)
 I have spoke to patient on the phone, She is going to [facility] to get supplies for her nebulizer. Patient is aware of needing to keep appointment with provider.

## 2023-12-01 DIAGNOSIS — J0110 Acute frontal sinusitis, unspecified: Secondary | ICD-10-CM | POA: Diagnosis not present

## 2023-12-01 DIAGNOSIS — H1013 Acute atopic conjunctivitis, bilateral: Secondary | ICD-10-CM | POA: Diagnosis not present

## 2023-12-03 ENCOUNTER — Other Ambulatory Visit

## 2023-12-04 ENCOUNTER — Ambulatory Visit (INDEPENDENT_AMBULATORY_CARE_PROVIDER_SITE_OTHER): Admitting: Psychology

## 2023-12-04 DIAGNOSIS — F4310 Post-traumatic stress disorder, unspecified: Secondary | ICD-10-CM | POA: Diagnosis not present

## 2023-12-04 DIAGNOSIS — F902 Attention-deficit hyperactivity disorder, combined type: Secondary | ICD-10-CM | POA: Diagnosis not present

## 2023-12-04 DIAGNOSIS — F422 Mixed obsessional thoughts and acts: Secondary | ICD-10-CM | POA: Diagnosis not present

## 2023-12-05 ENCOUNTER — Ambulatory Visit

## 2023-12-05 DIAGNOSIS — R293 Abnormal posture: Secondary | ICD-10-CM

## 2023-12-05 DIAGNOSIS — M79672 Pain in left foot: Secondary | ICD-10-CM | POA: Diagnosis not present

## 2023-12-05 DIAGNOSIS — M353 Polymyalgia rheumatica: Secondary | ICD-10-CM | POA: Diagnosis not present

## 2023-12-05 DIAGNOSIS — R252 Cramp and spasm: Secondary | ICD-10-CM

## 2023-12-05 DIAGNOSIS — M6281 Muscle weakness (generalized): Secondary | ICD-10-CM

## 2023-12-05 DIAGNOSIS — M79671 Pain in right foot: Secondary | ICD-10-CM | POA: Diagnosis not present
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
## 2023-12-05 NOTE — Therapy (Signed)
 OUTPATIENT PHYSICAL THERAPY TREATMENT     Patient Name: Danielle Bruce MRN: [id number] DOB:Feb 14, 1961, 63 y.o., female Today's Date: 12/05/2023       PT End of Session - 12/05/23 1705     Visit Number 12    Date for Recertification  02/04/24    Authorization Type Medicare-KX needed    Progress Note Due on Visit 19    PT Start Time 1618    PT Stop Time 1700    PT Time Calculation (min) 42 min    Activity Tolerance Patient tolerated treatment well    Behavior During Therapy [facility] for tasks assessed/performed                          Past Medical History:  Diagnosis Date   Abdominal pain, unspecified site 10/18/2012   Anemia    Anxiety    Arthritis    osteoarthritis   ASCUS (atypical squamous cells of undetermined significance) on Pap smear 05/06/2005   NEG HIGH RISK HPV--C&B BIOPSY BENIGN 12/2005   Asthma    Bipolar 2 disorder (HCC)    Cancer (HCC)    skin cancer - basal cell   Colon polyps    Complication of anesthesia    anxious afterwards, will get headaches    Constipation    Depression    Fibromyalgia 10/2013   GERD (gastroesophageal reflux disease)    Hearing loss on left    Heart murmur    never had any problems   Hemorrhoids    High cholesterol    High risk HPV infection 08/2011   cytology negative   IBS (irritable bowel syndrome)    Insomnia    Lymphocytic colitis    Lymphoma (HCC)    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   Bone marrow biopsy showes 8% plasma cells IgA Lambda   Migraines    Nausea alone 10/18/2012   Osteoarthritis    Osteopenia    Peripheral neuropathy    PTSD (post-traumatic stress disorder)    Past Surgical History:  Procedure Laterality Date   BONE MARROW BIOPSY Left 12/18/2013   Plasma cell dyscrasia 8% population of plasma cells   BREAST BIOPSY Right    benign stereo   CESAREAN SECTION  13,11,06   CHOLECYSTECTOMY N/A 10/16/2012   Procedure: LAPAROSCOPIC CHOLECYSTECTOMY;   Surgeon: Debby LABOR. Cornett, MD;  Location: [facility];  Service: General;  Laterality: N/A;   COLONOSCOPY     numerous times   DILATION AND CURETTAGE OF UTERUS     ESOPHAGOGASTRODUODENOSCOPY     HEMORRHOID SURGERY  1993   x3   IUD REMOVAL  02/2015   Mirena   LAPAROSCOPIC LYSIS OF ADHESIONS N/A 10/16/2012   Procedure: LAPAROSCOPIC LYSIS OF ADHESIONS;  Surgeon: Debby A. Cornett, MD;  Location: [facility];  Service: General;  Laterality: N/A;   LAPAROSCOPY N/A 10/16/2012   Procedure: LAPAROSCOPY DIAGNOSTIC;  Surgeon: Debby LABOR. Cornett, MD;  Location: [facility];  Service: General;  Laterality: N/A;   PELVIC LAPAROSCOPY     RADIOLOGY WITH ANESTHESIA N/A 12/16/2015   Procedure: MRI OF BRAIN WITH AND WITHOUT CONTRAST;  Surgeon: Medication Radiologist, MD;  Location: [facility];  Service: Radiology;  Laterality: N/A;   SHOULDER SURGERY  2007/2008   SPINE SURGERY  2010   cervical   Patient Active Problem List   Diagnosis Date Noted   Sensorineural hearing loss, bilateral 11/19/2023   GAD (generalized anxiety disorder) 12/26/2017   OCD (  obsessive compulsive disorder) 12/26/2017   PTSD (post-traumatic stress disorder) 12/26/2017   DDD (degenerative disc disease), cervical 07/14/2016   Primary osteoarthritis of both feet 07/14/2016   Primary osteoarthritis of both hands 07/14/2016   Other fatigue 07/14/2016   History of IBS 07/14/2016   Osteopenia of multiple sites 07/14/2016   Fibromyalgia 01/13/2016   MGUS (monoclonal gammopathy of unknown significance) 12/23/2013   Chronic cholecystitis without calculus 10/18/2012   Abdominal pain 10/18/2012   Nausea alone 10/18/2012   Constipation 10/18/2012   Depression 10/18/2012   Anxiety    ASCUS (atypical squamous cells of undetermined significance) on Pap smear    IUD    Hemorrhoids 11/29/2010   Abdominal pain, left upper quadrant 11/29/2010    PCP:  Joen Gentry, MD  REFERRING PROVIDER: Joen Gentry, MD  REFERRING DIAG: fibromyalgia  THERAPY  DIAG:  Abnormal posture  Cramp and spasm  Muscle weakness (generalized)  Polymyalgia rheumatica  Rationale for Evaluation and Treatment Rehabilitation  ONSET DATE: chronic pain (fibromyalgia), bowel flare up 1.5 months ago  SUBJECTIVE:                                                                                                                                                                                                         SUBJECTIVE STATEMENT:  I was sick and I'm feeling better now.  Physically feeling good.  I've been stretching my pecs and I found my weights and have been using them.    PERTINENT HISTORY:  Anxiety, bipolar disorder, depression, fibromyalgia, IBS, migraines, osteopenia, PTSD  PAIN: 12/05/23 Are you having pain: no pain today Pain location:   Pain rating:  Pain description: irritating, annoying   Aggravating factors: doing too much, stress  Relieving factors: muscle relaxers, rest, stretching  PRECAUTIONS: Other: chronic pain syndrome and depression Other: slow progression with exercise due to chronic condition  WEIGHT BEARING RESTRICTIONS No  FALLS:  Has patient fallen in last 6 months? No  LIVING ENVIRONMENT: Lives with: lives with their family Lives in: House/apartment  OCCUPATION: on disability  PLOF: Independent with basic ADLs and Leisure: yardwork, walking  Pt cares for her 4 young grandchildren- difficulty with care including lifting and carrying  PATIENT GOALS be more active with less pain, exercise at the gym regularly, lift and carry grandchildren and laundry, improved ease with yardwork and housework, resume exercise  OBJECTIVE:   DIAGNOSTIC FINDINGS: none recent   PATIENT SURVEYS:  The Patient-Specific Functional Scale  Initial:  I am going to ask you to identify up to 3 important activities that you  are unable to do or are having difficulty with as a result of this problem.  Today are there any activities that you are  unable to do or having difficulty with because of this?  (Patient shown scale and patient rated each activity)  Follow up: When you first came in you had difficulty performing these activities.  Today do you still have difficulty?  Patient-Specific activity scoring scheme (Point to one number):  0 1 2 3 4 5 6 7 8 9  10 Unable                                                                                                          Able to perform To perform                                                                                                    activity at the same Activity         Level as before                                                                                                                       Injury or problem  Activity   Lifting laundry and groceries/housework                           initial:  3/10  11/14/23: 5/10  2.    Yard work- up and down with squatting          Initial: 2/10  11/14/23: 3/10     COGNITION: Overall cognitive status: Within functional limits for tasks assessed  SENSATION: WFL  POSTURE: rounded shoulders and forward head  PALPATION: Diffuse palpable tenderness over bil neck, upper traps, thoracic, lumbar and gluteals with trigger points.    CERVICAL ROM:   Stiff at end range and pain on Rt with Lt motions.  All WFLs  UPPER EXTREMITY ROM: UE A/ROM is limited by 20% into flexion and abduction with pain at end range.  Hip flexibility is limited by 25% in all directions with pain in all directions.  UPPER EXTREMITY MMT: UE:  4+/5, LE 4+/5 except hip flexors 4-/5  TODAY'S TREATMENT:   12/05/23:  Nustep level 3 for 10 min- PT monitored and discussed pt status Biceps curls: 6# 2x10 -seated on ball  Ball roll outs 3 ways x10 each  Seated hamstring stretch 3x20 seconds  Seated on ball: 3# 3 way raises 2x10 Seated overhead press: 3# 2x10- seated on ball Sit to stand: 5# parallel and staggered stance x10 each-with high pull [employer] Carry: 5# around cancer gym x2 laps each hand  11/28/23:Pt arrives for aquatic physical therapy. Treatment took place in 3.5-5.5 feet of water. Water temperature was 91 degres F. Pt entered the pool via stairs independently.  -50% depth water walking with UE push pull with yellow floats 10x each direction. VC to walk as fast as she could to create a good resistive current.Ankle fins added - High knee march 6x holding rainbow floats by her side. Vc to keep heart lifted.  -Bil hip lifts 3 directions with ankle fins 20x each -Pink bells core work: static stance arms forward and back 2x20 -Standing against wall core/lat press with 2 rainbow floats 3x10 -Horseback bicycle on yellow 6 min, ankle fins  11/21/23:  Nustep level 3 for 10 min- PT monitored and discussed pt status Biceps curls: 6# 2x10  Ball roll outs 3 ways x10 each  Standing hamstring stretch on power plate  Seated on ball: 3# 3 way raises 2x10 Seated overhead press: 3# 2x10 Sit to stand: 5# parallel and staggered stance x10 each-with high pull [employer] Carry: 5# around cancer gym x2 laps each hand    HOME EXERCISE PROGRAM:  Access Code: [email or web address]- previously provided  Access Code: [email or web address] URL: [email or web address] Date: 09/19/2023 Prepared by: Burnard  Exercises - Seated Cervical Flexion AROM  - 3 x daily - 7 x weekly - 1 sets - 3 reps - 20 hold - Seated Cervical Sidebending AROM  - 3 x daily - 7 x weekly - 1 sets - 3 reps - 20 hold - Seated Cervical Rotation AROM  - 3 x daily - 7 x weekly - 1 sets - 3 reps - 20 hold - Seated Correct Posture  - 1 x daily - 7 x weekly - 3 sets - 10 reps - Seated Diaphragmatic Breathing  - 5 x daily - 7 x weekly - 1 sets - 10 reps - Sidelying Open Book Thoracic Rotation with Knee on Foam Roll  - 2 x daily - 7 x weekly - 3 sets - 10 reps - Seated Transversus Abdominis Bracing  - 5 x daily - 7 x weekly - 1 sets - 10 reps - 5 hold - Seated Hamstring Stretch  - 3 x daily - 7 x weekly - 1 sets - 3 reps - 20 hold ASSESSMENT:  CLINICAL IMPRESSION: Pt arrived feeling well physically. She has been stretching and  weight training at home.  Emphasis on strength and endurance today with verbal cues for form.  Pt with improved endurance and tolerance for advancement of activity in the clinic.  She advanced her biceps curls to sitting on ball.  PT monitored throughout session.  Patient will benefit from skilled PT to address the below impairments and improve overall function.    OBJECTIVE IMPAIRMENTS decreased activity tolerance, decreased endurance, difficulty walking, decreased strength, increased muscle spasms, impaired flexibility, improper body mechanics, postural dysfunction, and pain.   ACTIVITY LIMITATIONS carrying, lifting, sitting, standing, reach over head, hygiene/grooming, and locomotion level  PARTICIPATION LIMITATIONS: meal prep, cleaning, laundry, driving, community activity, and yard work  PERSONAL FACTORS Past/current experiences, Time since onset of injury/illness/exacerbation, and 3+ comorbidities: fibromyalgia depression, anxiety,  are also affecting patient's functional outcome.   REHAB POTENTIAL: Good  CLINICAL DECISION MAKING: Evolving/moderate complexity  EVALUATION COMPLEXITY: Moderate   GOALS: Goals reviewed with patient? Yes  SHORT TERM GOALS: Target date: 10/17/2023    Return to regular performance of HEP 4-5x/week to improve mobility  Baseline: 3-5x/wk (met) Goal status: MET  2.  Rate housework > or = to 4/10 on patient specific functional scale (PSFS) Baseline: 5/10 Goal status: MET  3.  Verbalize and demonstrate abdominal massage daily to reduce GI pain Baseline:  Goal status: Met 8/11    LONG TERM GOALS: Target date: 02/04/24    Be independent in advanced HEP Baseline:  Goal status:  Ongoing  2.  Rate squatting for yard work > or = to 4/10 on PSFS Baseline: 3/10 (11/14/23) Goal status:NEW  3.  Report > or = to 70% reduction in neck and thoracic pain with daily tasks  Baseline: 50% (11/14/23) Goal status: Revised   4.  Walk for exercise and perform  regular exercises 3-5x/wk for exercise to improve endurance  Baseline: some weeks, more consistent now (11/14/23) Goal status: in progress   5.  Rate housework > or = to 6/10 on PSFS  Baseline: 5/10  (11/14/23) Goal status: revised  PLAN: PT FREQUENCY: 1-2x/week  PT DURATION: 12 weeks  PLANNED INTERVENTIONS: 97164- PT Re-evaluation, 97110-Therapeutic exercises, 97530- Therapeutic activity, 97112- Neuromuscular re-education, 97535- Self Care, 02859- Manual therapy, [phone]- Gait training, [phone]- Aquatic Therapy, [phone]- Electrical stimulation (unattended), [phone]- Electrical stimulation (manual), [id number]- Traction (mechanical), 20560 (1-2 muscles), 20561 (3+ muscles)- Dry Needling, Patient/Family education, Balance training, Taping, Joint mobilization, Spinal manipulation, Spinal mobilization, Cryotherapy, Moist heat, Therapeutic exercises, Therapeutic activity, Neuromuscular re-education, and Self Care  PLAN FOR NEXT SESSION:: continued advancement of mobility, strength and manual to address tension   Burnard Joy, PT 12/05/23 5:07 PM

## 2023-12-06 ENCOUNTER — Ambulatory Visit
Admission: RE | Admit: 2023-12-06 | Discharge: 2023-12-06 | Disposition: A | Discharge status: Home / Self Care | Source: Ambulatory Visit | Attending: Family Medicine | Admitting: Family Medicine

## 2023-12-06 DIAGNOSIS — K760 Fatty (change of) liver, not elsewhere classified: Secondary | ICD-10-CM | POA: Diagnosis not present

## 2023-12-11 ENCOUNTER — Ambulatory Visit: Admitting: Psychology

## 2023-12-11 DIAGNOSIS — F422 Mixed obsessional thoughts and acts: Secondary | ICD-10-CM | POA: Diagnosis not present

## 2023-12-11 DIAGNOSIS — F4310 Post-traumatic stress disorder, unspecified: Secondary | ICD-10-CM | POA: Diagnosis not present

## 2023-12-11 DIAGNOSIS — F902 Attention-deficit hyperactivity disorder, combined type: Secondary | ICD-10-CM | POA: Diagnosis not present

## 2023-12-11 NOTE — Progress Notes (Deleted)
 Follow Up Note  RE: Danielle Bruce MRN: [id number] DOB: 04/01/1960 Date of Office Visit: 12/12/2023  Referring provider: Loreli Kins, MD Primary care provider: Loreli Kins, MD  Chief Complaint: No chief complaint on file.  History of Present Illness: I had the pleasure of seeing Danielle Bruce for a follow up visit at the [facility] on 12/12/2023. She is a 63 y.o. female, who is being followed for allergic rhinoconjunctivitis, reactive airway disease, contact dermatitis, GERD. Her previous allergy office visit was on 11/01/2022 with Dr. Luke. Today is a regular follow up visit.  Discussed the use of AI scribe software for clinical note transcription with the patient, who gave verbal consent to proceed.  History of Present Illness          Did she stop AIT?  2024 labs: Bloodwork positive to dogs only. Let us  know if you are interested in starting shots for dog.  Assessment and Plan: Danielle Bruce is a 63 y.o. female with: Other allergic rhinitis Allergic conjunctivitis of both eyes Rhino conjunctivitis symptoms which flare around cats and dogs. On AIT in the past with some benefit. 2021 skin testing only positive to dog. Apparently she had a lot of bleeding from the allergy testing? Records requested from prior allergist and reviewed. Get bloodwork and will make additional recommendations based on results.  Use over the counter antihistamines such as Zyrtec  (cetirizine ), Claritin  (loratadine ), Allegra (fexofenadine), or Xyzal  (levocetirizine) daily as needed. May take twice a day during allergy flares. May switch antihistamines every few months. Use Flonase  (fluticasone ) nasal spray 1-2 sprays per nostril once a day as needed for nasal congestion.  Use azelastine  nasal spray 1-2 sprays per nostril twice a day as needed for runny nose/drainage. Nasal saline spray (i.e., Simply Saline) or nasal saline lavage (i.e., NeilMed) is recommended as needed and prior to  medicated nasal sprays. Use olopatadine  eye drops 0.2% once a day as needed for itchy/watery eyes.   Mild intermittent reactive airway disease without complication Usually flares with URI. Had prednisone  last year. Currently not on any daily inhalers but apparently was prescribed Breo in the past.  Today's spirometry was normal. May use albuterol  rescue inhaler 2 puffs every 4 to 6 hours as needed for shortness of breath, chest tightness, coughing, and wheezing.  Monitor frequency of use - if you need to use it more than twice per week on a consistent basis let us  know.    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 See below for proper skin care.    GERD Continue lifestyle and dietary modifications.   Lactose intolerance May use lactose free milk or take a lactaid pill right before consuming anything with dairy. Assessment and Plan              No follow-ups on file.  No orders of the defined types were placed in this encounter.  Lab Orders  No laboratory test(s) ordered today    Diagnostics: Spirometry:  Tracings reviewed. Her effort: {Blank single:19197::Good reproducible efforts.,It was hard to get consistent efforts and there is a question as to whether this reflects a maximal maneuver.,Poor effort, data can not be interpreted.} FVC: ***L FEV1: ***L, ***% predicted FEV1/FVC ratio: ***% Interpretation: {Blank single:19197::Spirometry consistent with mild obstructive disease,Spirometry consistent with moderate obstructive disease,Spirometry consistent with severe obstructive disease,Spirometry consistent with possible restrictive disease,Spirometry consistent with mixed obstructive and restrictive disease,Spirometry uninterpretable due to technique,Spirometry consistent with normal pattern,No overt abnormalities  noted given today's efforts}.  Please see scanned spirometry results for details.  Skin  Testing: {Blank single:19197::Select foods,Environmental allergy panel,Environmental allergy panel and select foods,Food allergy panel,None,Deferred due to recent antihistamines use}. *** Results discussed with patient/family.   Medication List:  Current Outpatient Medications  Medication Sig Dispense Refill   albuterol  (PROVENTIL  HFA;VENTOLIN  HFA) 108 (90 Base) MCG/ACT inhaler Inhale 2 puffs into the lungs every 6 (six) hours as needed for wheezing or shortness of breath.     amphetamine -dextroamphetamine (ADDERALL XR) 10 MG 24 hr capsule Take 1 capsule (10 mg total) by mouth daily. 30 capsule 0   azelastine  (ASTELIN ) 0.1 % nasal spray Place 1-2 sprays into both nostrils 2 (two) times daily as needed (nasal drainage). Use in each nostril as directed 30 mL 3   Calcium  Carb-Cholecalciferol (CALCIUM  1000 + D) 1000-20 MG-MCG TABS Take by mouth.     cetirizine  (ZYRTEC ) 10 MG tablet Take 1 tablet (10 mg total) by mouth daily. 30 tablet 11   cyclobenzaprine (FLEXERIL) 10 MG tablet Take 10 mg by mouth at bedtime as needed.     diphenhydrAMINE  (BENADRYL ) 25 mg capsule Take 50 mg by mouth every 6 (six) hours as needed (HEADACHES).     EPINEPHrine  (EPIPEN  2-PAK) 0.3 mg/0.3 mL IJ SOAJ injection Inject 0.3 mg into the muscle as needed for anaphylaxis. 0.3 mL 1   Erenumab-aooe 70 MG/ML SOAJ Inject into the skin every 28 (twenty-eight) days.     fluticasone  (FLONASE ) 50 MCG/ACT nasal spray Place 1-2 sprays into both nostrils daily as needed (nasal congestion). 16 g 3   fluvoxaMINE  (LUVOX ) 25 MG tablet Take 3 tablets (75 mg total) by mouth at bedtime. 225 tablet 1   hydrocortisone (ANUSOL-HC) 25 MG suppository 1 suppository     ibuprofen  (ADVIL ) 800 MG tablet 1 tablet with food or milk as needed     L-Methylfolate 15 MG TABS TAKE 1 TABLET BY MOUTH DAILY 30 tablet 11   LORazepam  (ATIVAN ) 1 MG tablet TAKE 1 TABLET BY MOUTH EVERY 8 HOURS AS NEEDED FOR ANXIETY( TAKE 2 MG WHEN HAVING A MEDICAL  PROCEDURE) 30 tablet 0   NURTEC 75 MG TBDP Take 1 tablet by mouth daily as needed.     Olopatadine  HCl 0.2 % SOLN Apply 1 drop to eye daily as needed (itchy/watery eyes). 2.5 mL 5   oxyCODONE -acetaminophen  (PERCOCET/ROXICET) 5-325 MG per tablet Take 2 tablets by mouth daily as needed for moderate pain or severe pain (mighraine.).      polyethylene glycol (MIRALAX  / GLYCOLAX ) packet Take 17 g by mouth daily as needed for mild constipation.     PROLIA 60 MG/ML SOSY injection BRING TO THE OFFICE FOR INJECTION ON 02/15/2018 AS DIRECTED ONCE EVERY 6 MONTHS     promethazine  (PHENERGAN ) 25 MG tablet Take 25 mg by mouth every 6 (six) hours as needed for nausea.     rosuvastatin  (CRESTOR ) 10 MG tablet Take 1 tablet (10 mg total) by mouth daily. 90 tablet 3   traZODone  (DESYREL ) 50 MG tablet TAKE 1 TABLET(50 MG) BY MOUTH AT BEDTIME 90 tablet 0   VASCEPA 1 g CAPS TK 2 CS PO BID WC  3   VOLTAREN 1 % GEL Apply 2 g topically 4 (four) times daily as needed (JOINT PAIN).   3   No current facility-administered medications for this visit.   Allergies: Allergies  Allergen Reactions   Sulfa Antibiotics Nausea And Vomiting    Causes pt to vomit blood Other reaction(s): vomiting blood  Gluten Meal Other (See Comments)    Sensitivity.    Lactose Intolerance (Gi) Nausea And Vomiting   Aspirin     Other reaction(s): colitis   Bromfed Dm [Pseudoeph-Bromphen-Dm]     Other reaction(s): strung out and hallucinating   Chlorpromazine     Other reaction(s): Dark thoughts   Desipramine     Other reaction(s): rash   Gabapentin      Other reaction(s): swelling / fuzzy vision   Hydrocodone      Other reaction(s): causes hangovers   Lamotrigine      Other reaction(s): rash   Metronidazole     Other reaction(s): vomiting blood   Morphine Sulfate     Other reaction(s): tearful   Nsaids     Other reaction(s): colitis   Flagyl [Metronidazole Hcl] Nausea And Vomiting   Morphine And Codeine  Other (See Comments)     Reaction: Depression, emotional   I reviewed her past medical history, social history, family history, and environmental history and no significant changes have been reported from her previous visit.  Review of Systems  Constitutional:  Negative for appetite change, chills, fever and unexpected weight change.  HENT:  Positive for congestion, postnasal drip and rhinorrhea.   Eyes:  Negative for itching.  Respiratory:  Negative for cough, chest tightness, shortness of breath and wheezing.   Cardiovascular:  Negative for chest pain.  Gastrointestinal:  Negative for abdominal pain.  Genitourinary:  Negative for difficulty urinating.  Skin:  Positive for rash.  Neurological:  Positive for headaches.    Objective: LMP 12/28/2010  There is no height or weight on file to calculate BMI. Physical Exam Vitals and nursing note reviewed.  Constitutional:      Appearance: Normal appearance. She is well-developed.  HENT:     Head: Normocephalic and atraumatic.     Right Ear: Tympanic membrane and external ear normal.     Left Ear: Tympanic membrane and external ear normal.     Nose: Nose normal.     Mouth/Throat:     Mouth: Mucous membranes are moist.     Pharynx: Oropharynx is clear.  Eyes:     Conjunctiva/sclera: Conjunctivae normal.  Cardiovascular:     Rate and Rhythm: Normal rate and regular rhythm.     Heart sounds: Normal heart sounds. No murmur heard.    No friction rub. No gallop.  Pulmonary:     Effort: Pulmonary effort is normal.     Breath sounds: Normal breath sounds. No wheezing, rhonchi or rales.  Musculoskeletal:     Cervical back: Neck supple.  Skin:    General: Skin is warm.     Findings: No rash.  Neurological:     Mental Status: She is alert and oriented to person, place, and time.  Psychiatric:        Behavior: Behavior normal.    Previous notes and tests were reviewed. The plan was reviewed with the patient/family, and all questions/concerned were  addressed.  It was my pleasure to see Danielle Bruce today and participate in her care. Please feel free to contact me with any questions or concerns.  Sincerely,  Orlan Cramp, DO Allergy & Immunology  [facility]  [facility] office: [phone] [facility] office: [phone]

## 2023-12-12 ENCOUNTER — Ambulatory Visit (INDEPENDENT_AMBULATORY_CARE_PROVIDER_SITE_OTHER): Admitting: Allergy

## 2023-12-12 ENCOUNTER — Other Ambulatory Visit: Payer: Self-pay

## 2023-12-12 ENCOUNTER — Encounter: Payer: Self-pay | Admitting: Physical Therapy

## 2023-12-12 ENCOUNTER — Ambulatory Visit: Admitting: Physical Therapy

## 2023-12-12 ENCOUNTER — Encounter: Payer: Self-pay | Admitting: Allergy

## 2023-12-12 ENCOUNTER — Ambulatory Visit: Admitting: Allergy

## 2023-12-12 VITALS — BP 114/74 | HR 76 | Temp 98.6°F | Resp 16 | Ht 62.5 in | Wt 127.5 lb

## 2023-12-12 DIAGNOSIS — J4520 Mild intermittent asthma, uncomplicated: Secondary | ICD-10-CM

## 2023-12-12 DIAGNOSIS — J3081 Allergic rhinitis due to animal (cat) (dog) hair and dander: Secondary | ICD-10-CM | POA: Diagnosis not present

## 2023-12-12 DIAGNOSIS — M79672 Pain in left foot: Secondary | ICD-10-CM

## 2023-12-12 DIAGNOSIS — M353 Polymyalgia rheumatica: Secondary | ICD-10-CM | POA: Diagnosis not present

## 2023-12-12 DIAGNOSIS — K219 Gastro-esophageal reflux disease without esophagitis: Secondary | ICD-10-CM

## 2023-12-12 DIAGNOSIS — H1013 Acute atopic conjunctivitis, bilateral: Secondary | ICD-10-CM

## 2023-12-12 DIAGNOSIS — R293 Abnormal posture: Secondary | ICD-10-CM

## 2023-12-12 DIAGNOSIS — M79671 Pain in right foot: Secondary | ICD-10-CM

## 2023-12-12 DIAGNOSIS — M6281 Muscle weakness (generalized): Secondary | ICD-10-CM

## 2023-12-12 DIAGNOSIS — R252 Cramp and spasm: Secondary | ICD-10-CM | POA: Diagnosis not present

## 2023-12-12 DIAGNOSIS — E739 Lactose intolerance, unspecified: Secondary | ICD-10-CM | POA: Diagnosis not present

## 2023-12-12 DIAGNOSIS — L2389 Allergic contact dermatitis due to other agents: Secondary | ICD-10-CM

## 2023-12-12 DIAGNOSIS — J3089 Other allergic rhinitis: Secondary | ICD-10-CM

## 2023-12-12 MED ORDER — ALBUTEROL SULFATE HFA 108 (90 BASE) MCG/ACT IN AERS: 2.0000 | INHALATION_SPRAY | RESPIRATORY_TRACT | 1 refills | Status: AC | PRN

## 2023-12-12 MED ORDER — EPINEPHRINE 0.3 MG/0.3ML IJ SOAJ: 0.3000 mg | INTRAMUSCULAR | 1 refills | Status: AC | PRN

## 2023-12-12 MED ORDER — ALBUTEROL SULFATE (2.5 MG/3ML) 0.083% IN NEBU: 2.5000 mg | INHALATION_SOLUTION | RESPIRATORY_TRACT | 1 refills | Status: DC | PRN

## 2023-12-12 MED ORDER — NEBULIZER/TUBING/MOUTHPIECE KIT: PACK | 1 refills | Status: AC

## 2023-12-12 MED ORDER — BUDESONIDE-FORMOTEROL FUMARATE 80-4.5 MCG/ACT IN AERO: 2.0000 | INHALATION_SPRAY | Freq: Two times a day (BID) | RESPIRATORY_TRACT | 3 refills | Status: AC

## 2023-12-12 NOTE — Progress Notes (Signed)
 Follow Up Note  RE: Danielle Bruce MRN: [id number] DOB: 03/30/1960 Date of Office Visit: 12/12/2023  Referring provider: Loreli Kins, MD Primary care provider: Loreli Kins, MD  Chief Complaint: Follow-up (She says she had respiratory infection on 09/23. And she would like to have nebulizer solution refill.) and Allergy rhinitis  History of Present Illness: I had the pleasure of seeing Danielle Bruce for a follow up visit at the [facility] on 12/12/2023. She is a 63 y.o. female, who is being followed for allergic rhinoconjunctivitis, reactive airway disease, contact dermatitis, GERD. Her previous allergy office visit was on 11/01/2022 with Dr. Luke. Today is a regular follow up visit.  Discussed the use of AI scribe software for clinical note transcription with the patient, who gave verbal consent to proceed.    She previously discontinued allergy shots due to frequent travel to assist her daughter with childcare.  She reports that all the kids are back in school and she no longer needs to travel for babysitting, so she expects to be able to attend appointments consistently.  She experiences symptoms such as itchy, watery eyes, sneezing, and rhinorrhea when exposed to dogs. She manages these symptoms with Benadryl  as needed and has recently started taking cetirizine  daily following an upper respiratory illness.  Her recent illness began around Sept 22nd or 23rd, characterized by a cough and upper respiratory symptoms. She was treated with Augmentin after visiting a clinic on October 4th, which improved her condition. During the illness, she experienced severe coughing fits, which she managed with albuterol  via a nebulizer for four days. Her albuterol  was expired and she is now out of nebulizer supplies.  She has a history of reactive airway issues, experiencing a sensation of 'fire' in her airways during episodes, but does not wheeze. She uses albuterol  during these  episodes, which occur once or twice a year, and finds it effective. She has not required prednisone  or steroids for this recent episode.  She has a history of chronic migraines and is actively managing her health with therapy for PTSD, physical therapy, and a healthy diet. She reports improvement in her overall condition.  She has a history of lactose intolerance and is awaiting a dermatology appointment in December due to her previous dermatologist no longer accepting Medicaid.  She is undergoing evaluation for liver issues, possibly non-alcoholic fatty liver disease, following GI symptoms over the summer.     2024 labs: Bloodwork positive to dogs only. Let us  know if you are interested in starting shots for dog.  Assessment and Plan: Holleigh is a 63 y.o. 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 On AIT in the past with some benefit. 2021 skin testing only positive to dog. 2024 labs positive to dog only. Interim history - had to stop AIT due to traveling. Wants to restart.  Use over the counter antihistamines such as Zyrtec  (cetirizine ), Claritin  (loratadine ), Allegra (fexofenadine), or Xyzal  (levocetirizine) daily as needed. May take twice a day during allergy flares. May switch antihistamines every few months. Use Flonase  (fluticasone ) nasal spray 1-2 sprays per nostril once a day as needed for nasal congestion.  Use azelastine  nasal spray 1-2 sprays per nostril twice a day as needed for runny nose/drainage. Nasal saline spray (i.e., Simply Saline) or nasal saline lavage (i.e., NeilMed) is recommended as needed and prior to medicated nasal sprays. Use olopatadine  eye  drops 0.2% once a day as needed for itchy/watery eyes. Recommend allergy injections. 1 injection. Let us  know when ready to start.  Had a detailed discussion with patient/family that clinical history  is suggestive of allergic rhinitis, and may benefit from allergy immunotherapy (AIT). Discussed in detail regarding the dosing, schedule, side effects (mild to moderate local allergic reaction and rarely systemic allergic reactions including anaphylaxis), and benefits (significant improvement in nasal symptoms, seasonal flares of asthma) of immunotherapy with the patient. There is significant time commitment involved with allergy shots, which includes weekly immunotherapy injections for first 9-12 months and then biweekly to monthly injections for 3-5 years. Consent was signed. I have prescribed epinephrine  device and demonstrated proper use. For mild symptoms you can take over the counter antihistamines (zyrtec  10mg  to 20mg ) and monitor symptoms closely.  If symptoms worsen or if you have severe symptoms including breathing issues, throat closure, significant swelling, whole body hives, severe diarrhea and vomiting, lightheadedness then use epinephrine  and seek immediate medical care afterwards. Emergency action plan given.   Mild intermittent reactive airway disease without complication Exacerbated by upper respiratory infections. Managed recent episode with albuterol  nebulizer only. No recent systemic steroids. Today's spirometry was normal. During respiratory infections/flares:  Start Symbicort 80mcg 2 puffs twice a day and rinse mouth after each use for 1-2 weeks until your breathing symptoms return to baseline.  Pretreat with albuterol  2 puffs or albuterol  nebulizer.  If you need to use your albuterol  nebulizer machine back to back within 15-30 minutes with no relief then please go to the ER/urgent care for further evaluation.  May use albuterol  rescue inhaler 2 puffs or nebulizer every 4 to 6 hours as needed for shortness of breath, chest tightness, coughing, and wheezing. May use albuterol  rescue inhaler 2 puffs 5 to 15 minutes prior to strenuous physical activities. Monitor frequency of use -  if you need to use it more than twice per week on a consistent basis let us  know.    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  Lactose intolerance May use lactose free milk or take a lactaid pill right before consuming anything with dairy.  Return in about 6 months (around 06/11/2024).  Meds ordered this encounter  Medications   albuterol  (PROVENTIL ) (2.5 MG/3ML) 0.083% nebulizer solution    Sig: Take 3 mLs (2.5 mg total) by nebulization every 4 (four) hours as needed for wheezing or shortness of breath (coughing fits).    Dispense:  75 mL    Refill:  1   albuterol  (VENTOLIN  HFA) 108 (90 Base) MCG/ACT inhaler    Sig: Inhale 2 puffs into the lungs every 4 (four) hours as needed for wheezing or shortness of breath (coughing fits).    Dispense:  18 g    Refill:  1   budesonide-formoterol (SYMBICORT) 80-4.5 MCG/ACT inhaler    Sig: Inhale 2 puffs into the lungs in the morning and at bedtime. with spacer and rinse mouth afterwards. Use it for 1-2 weeks during flares.    Dispense:  1 each    Refill:  3   Respiratory Therapy Supplies (NEBULIZER/TUBING/MOUTHPIECE) KIT    Sig: Use with nebulizer as needed.    Dispense:  1 kit    Refill:  1   EPINEPHrine  0.3 mg/0.3 mL IJ SOAJ injection    Sig: Inject 0.3 mg into the muscle as needed for anaphylaxis.    Dispense:  2 each    Refill:  1  May dispense generic/Mylan/Teva brand.   Lab Orders  No laboratory test(s) ordered today    Diagnostics: Spirometry:  Tracings reviewed. Her effort: Good reproducible efforts. FVC: 2.52L FEV1: 1.85L, 80% predicted FEV1/FVC ratio: 73% Interpretation: Spirometry consistent with normal pattern.  Please see scanned spirometry results for details.  Results discussed with patient/family.   Medication List:  Current Outpatient Medications  Medication Sig Dispense Refill   albuterol  (PROVENTIL ) (2.5 MG/3ML) 0.083% nebulizer solution Take 3 mLs (2.5 mg  total) by nebulization every 4 (four) hours as needed for wheezing or shortness of breath (coughing fits). 75 mL 1   albuterol  (VENTOLIN  HFA) 108 (90 Base) MCG/ACT inhaler Inhale 2 puffs into the lungs every 4 (four) hours as needed for wheezing or shortness of breath (coughing fits). 18 g 1   amphetamine -dextroamphetamine (ADDERALL XR) 10 MG 24 hr capsule Take 1 capsule (10 mg total) by mouth daily. (Patient taking differently: Take 10 mg by mouth as needed.) 30 capsule 0   azelastine  (ASTELIN ) 0.1 % nasal spray Place 1-2 sprays into both nostrils 2 (two) times daily as needed (nasal drainage). Use in each nostril as directed 30 mL 3   budesonide-formoterol (SYMBICORT) 80-4.5 MCG/ACT inhaler Inhale 2 puffs into the lungs in the morning and at bedtime. with spacer and rinse mouth afterwards. Use it for 1-2 weeks during flares. 1 each 3   Calcium  Carb-Cholecalciferol (CALCIUM  1000 + D) 1000-20 MG-MCG TABS Take by mouth.     cetirizine  (ZYRTEC ) 10 MG tablet Take 1 tablet (10 mg total) by mouth daily. (Patient taking differently: Take 10 mg by mouth as needed.) 30 tablet 11   cyclobenzaprine (FLEXERIL) 10 MG tablet Take 10 mg by mouth at bedtime as needed.     diphenhydrAMINE  (BENADRYL ) 25 mg capsule Take 50 mg by mouth every 6 (six) hours as needed (HEADACHES).     EPINEPHrine  0.3 mg/0.3 mL IJ SOAJ injection Inject 0.3 mg into the muscle as needed for anaphylaxis. 2 each 1   Erenumab-aooe 70 MG/ML SOAJ Inject into the skin every 28 (twenty-eight) days.     fluticasone  (FLONASE ) 50 MCG/ACT nasal spray Place 1-2 sprays into both nostrils daily as needed (nasal congestion). 16 g 3   fluvoxaMINE  (LUVOX ) 25 MG tablet Take 3 tablets (75 mg total) by mouth at bedtime. 225 tablet 1   ibuprofen  (ADVIL ) 800 MG tablet 1 tablet with food or milk as needed (Patient taking differently: as needed.)     L-Methylfolate 15 MG TABS TAKE 1 TABLET BY MOUTH DAILY 30 tablet 11   LORazepam  (ATIVAN ) 1 MG tablet TAKE 1 TABLET BY  MOUTH EVERY 8 HOURS AS NEEDED FOR ANXIETY( TAKE 2 MG WHEN HAVING A MEDICAL PROCEDURE) (Patient taking differently: as needed.) 30 tablet 0   NURTEC 75 MG TBDP Take 1 tablet by mouth daily as needed.     Olopatadine  HCl 0.2 % SOLN Apply 1 drop to eye daily as needed (itchy/watery eyes). 2.5 mL 5   oxyCODONE -acetaminophen  (PERCOCET/ROXICET) 5-325 MG per tablet Take 2 tablets by mouth daily as needed for moderate pain or severe pain (mighraine.).      PROLIA 60 MG/ML SOSY injection BRING TO THE OFFICE FOR INJECTION ON 02/15/2018 AS DIRECTED ONCE EVERY 6 MONTHS     promethazine  (PHENERGAN ) 25 MG tablet Take 25 mg by mouth every 6 (six) hours as needed for nausea.     Respiratory Therapy Supplies (NEBULIZER/TUBING/MOUTHPIECE) KIT Use with nebulizer as needed. 1 kit 1   rosuvastatin  (CRESTOR ) 10 MG tablet  Take 1 tablet (10 mg total) by mouth daily. 90 tablet 3   traZODone  (DESYREL ) 50 MG tablet TAKE 1 TABLET(50 MG) BY MOUTH AT BEDTIME 90 tablet 0   VASCEPA 1 g CAPS TK 2 CS PO BID WC  3   VOLTAREN 1 % GEL Apply 2 g topically 4 (four) times daily as needed (JOINT PAIN).   3   hydrocortisone (ANUSOL-HC) 25 MG suppository 1 suppository (Patient not taking: Reported on 12/12/2023)     polyethylene glycol (MIRALAX  / GLYCOLAX ) packet Take 17 g by mouth daily as needed for mild constipation. (Patient not taking: Reported on 12/12/2023)     No current facility-administered medications for this visit.   Allergies: Allergies  Allergen Reactions   Sulfa Antibiotics Nausea And Vomiting    Causes pt to vomit blood Other reaction(s): vomiting blood   Gluten Meal Other (See Comments)    Sensitivity.    Lactose Intolerance (Gi) Nausea And Vomiting   Aspirin     Other reaction(s): colitis   Bromfed Dm [Pseudoeph-Bromphen-Dm]     Other reaction(s): strung out and hallucinating   Chlorpromazine     Other reaction(s): Dark thoughts   Desipramine     Other reaction(s): rash   Gabapentin      Other reaction(s):  swelling / fuzzy vision   Hydrocodone      Other reaction(s): causes hangovers   Lamotrigine      Other reaction(s): rash   Metronidazole     Other reaction(s): vomiting blood   Morphine Sulfate     Other reaction(s): tearful   Nsaids     Other reaction(s): colitis   Flagyl [Metronidazole Hcl] Nausea And Vomiting   Morphine And Codeine  Other (See Comments)    Reaction: Depression, emotional   I reviewed her past medical history, social history, family history, and environmental history and no significant changes have been reported from her previous visit.  Review of Systems  Constitutional:  Negative for appetite change, chills, fever and unexpected weight change.  HENT:  Negative for congestion, postnasal drip and rhinorrhea.   Eyes:  Negative for itching.  Respiratory:  Negative for cough, chest tightness, shortness of breath and wheezing.   Cardiovascular:  Negative for chest pain.  Gastrointestinal:  Negative for abdominal pain.  Genitourinary:  Negative for difficulty urinating.  Allergic/Immunologic: Positive for environmental allergies.  Neurological:  Positive for headaches.    Objective: BP 114/74 (BP Location: Left Arm, Patient Position: Sitting, Cuff Size: Normal)   Pulse 76   Temp 98.6 F (37 C) (Temporal)   Resp 16   Ht 5' 2.5 (1.588 m)   Wt 127 lb 8 oz (57.8 kg)   LMP 12/28/2010   SpO2 95%   BMI 22.95 kg/m  Body mass index is 22.95 kg/m. Physical Exam Vitals and nursing note reviewed.  Constitutional:      Appearance: Normal appearance. She is well-developed.  HENT:     Head: Normocephalic and atraumatic.     Right Ear: Tympanic membrane and external ear normal.     Left Ear: Tympanic membrane and external ear normal.     Nose: Nose normal.     Mouth/Throat:     Mouth: Mucous membranes are moist.     Pharynx: Oropharynx is clear.  Eyes:     Conjunctiva/sclera: Conjunctivae normal.  Cardiovascular:     Rate and Rhythm: Normal rate and regular  rhythm.     Heart sounds: Normal heart sounds. No murmur heard.    No friction rub. No  gallop.  Pulmonary:     Effort: Pulmonary effort is normal.     Breath sounds: Normal breath sounds. No wheezing, rhonchi or rales.  Musculoskeletal:     Cervical back: Neck supple.  Skin:    General: Skin is warm.     Findings: No rash.  Neurological:     Mental Status: She is alert and oriented to person, place, and time.  Psychiatric:        Behavior: Behavior normal.    Previous notes and tests were reviewed. The plan was reviewed with the patient/family, and all questions/concerned were addressed.  It was my pleasure to see Jecenia today and participate in her care. Please feel free to contact me with any questions or concerns.  Sincerely,  Orlan Cramp, DO Allergy & Immunology  [facility]  [facility] office: [phone] [facility] office: [phone]

## 2023-12-12 NOTE — Patient Instructions (Addendum)
 Environmental allergies 2024 labs positive to dog.  Use over the counter antihistamines such as Zyrtec  (cetirizine ), Claritin  (loratadine ), Allegra (fexofenadine), or Xyzal  (levocetirizine) daily as needed. May take twice a day during allergy flares. May switch antihistamines every few months. Use Flonase  (fluticasone ) nasal spray 1-2 sprays per nostril once a day as needed for nasal congestion.  Use azelastine  nasal spray 1-2 sprays per nostril twice a day as needed for runny nose/drainage. Nasal saline spray (i.e., Simply Saline) or nasal saline lavage (i.e., NeilMed) is recommended as needed and prior to medicated nasal sprays. Use olopatadine  eye drops 0.2% once a day as needed for itchy/watery eyes. Recommend allergy injections. 1 injection. Let us  know when ready to start.  Had a detailed discussion with patient/family that clinical history is suggestive of allergic rhinitis, and may benefit from allergy immunotherapy (AIT). Discussed in detail regarding the dosing, schedule, side effects (mild to moderate local allergic reaction and rarely systemic allergic reactions including anaphylaxis), and benefits (significant improvement in nasal symptoms, seasonal flares of asthma) of immunotherapy with the patient. There is significant time commitment involved with allergy shots, which includes weekly immunotherapy injections for first 9-12 months and then biweekly to monthly injections for 3-5 years. Consent was signed. I have prescribed epinephrine  device and demonstrated proper use. For mild symptoms you can take over the counter antihistamines (zyrtec  10mg  to 20mg ) and monitor symptoms closely.  If symptoms worsen or if you have severe symptoms including breathing issues, throat closure, significant swelling, whole body hives, severe diarrhea and vomiting, lightheadedness then use epinephrine  and seek immediate medical care afterwards. Emergency action plan given.  Breathing Normal breathing test.   During respiratory infections/flares:  Start Symbicort 80mcg 2 puffs twice a day and rinse mouth after each use for 1-2 weeks until your breathing symptoms return to baseline.  Pretreat with albuterol  2 puffs or albuterol  nebulizer.  If you need to use your albuterol  nebulizer machine back to back within 15-30 minutes with no relief then please go to the ER/urgent care for further evaluation.  May use albuterol  rescue inhaler 2 puffs or nebulizer every 4 to 6 hours as needed for shortness of breath, chest tightness, coughing, and wheezing. May use albuterol  rescue inhaler 2 puffs 5 to 15 minutes prior to strenuous physical activities. Monitor frequency of use - if you need to use it more than twice per week on a consistent basis let us  know.  Breathing control goals:  Full participation in all desired activities (may need albuterol  before activity) Albuterol  use two times or less a week on average (not counting use with activity) Cough interfering with sleep two times or less a month Oral steroids no more than once a year No hospitalizations  Lactose intolerance May use lactose free milk or take a lactaid pill right before consuming anything with dairy.  Skin Follow up with dermatology as scheduled.  Follow up in 6 months or sooner if needed.   Pet Allergen Avoidance: Contrary to popular opinion, there are no "hypoallergenic" breeds of dogs or cats. That is because people are not allergic to an animal's hair, but to an allergen found in the animal's saliva, dander (dead skin flakes) or urine. Pet allergy symptoms typically occur within minutes. For some people, symptoms can build up and become most severe 8 to 12 hours after contact with the animal. People with severe allergies can experience reactions in public places if dander has been transported on the pet owners' clothing. Keeping an animal outdoors is only  a partial solution, since homes with pets in the yard still have higher  concentrations of animal allergens. Before getting a pet, ask your allergist to determine if you are allergic to animals. If your pet is already considered part of your family, try to minimize contact and keep the pet out of the bedroom and other rooms where you spend a great deal of time. As with dust mites, vacuum carpets often or replace carpet with a hardwood floor, tile or linoleum. High-efficiency particulate air (HEPA) cleaners can reduce allergen levels over time. While dander and saliva are the source of cat and dog allergens, urine is the source of allergens from rabbits, hamsters, mice and israel pigs; so ask a non-allergic family member to clean the animal's cage. If you have a pet allergy, talk to your allergist about the potential for allergy immunotherapy (allergy shots). This strategy can often provide long-term relief.

## 2023-12-12 NOTE — Therapy (Signed)
 OUTPATIENT PHYSICAL THERAPY TREATMENT     Patient Name: Danielle Bruce MRN: [id number] DOB:12-31-1960, 63 y.o., female Today's Date: 12/12/2023       PT End of Session - 12/12/23 0854     Visit Number 13    Date for Recertification  02/04/24    Authorization Type Medicare-KX needed    Progress Note Due on Visit 19    PT Start Time 0852    PT Stop Time 0930   pt late   PT Time Calculation (min) 38 min    Activity Tolerance Patient tolerated treatment well    Behavior During Therapy [facility] for tasks assessed/performed                           Past Medical History:  Diagnosis Date   Abdominal pain, unspecified site 10/18/2012   Anemia    Anxiety    Arthritis    osteoarthritis   ASCUS (atypical squamous cells of undetermined significance) on Pap smear 05/06/2005   NEG HIGH RISK HPV--C&B BIOPSY BENIGN 12/2005   Asthma    Bipolar 2 disorder (HCC)    Cancer (HCC)    skin cancer - basal cell   Colon polyps    Complication of anesthesia    anxious afterwards, will get headaches    Constipation    Depression    Fibromyalgia 10/2013   GERD (gastroesophageal reflux disease)    Hearing loss on left    Heart murmur    never had any problems   Hemorrhoids    High cholesterol    High risk HPV infection 08/2011   cytology negative   IBS (irritable bowel syndrome)    Insomnia    Lymphocytic colitis    Lymphoma (HCC)    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   Bone marrow biopsy showes 8% plasma cells IgA Lambda   Migraines    Nausea alone 10/18/2012   Osteoarthritis    Osteopenia    Peripheral neuropathy    PTSD (post-traumatic stress disorder)    Past Surgical History:  Procedure Laterality Date   BONE MARROW BIOPSY Left 12/18/2013   Plasma cell dyscrasia 8% population of plasma cells   BREAST BIOPSY Right    benign stereo   CESAREAN SECTION  13,11,06   CHOLECYSTECTOMY N/A 10/16/2012   Procedure: LAPAROSCOPIC  CHOLECYSTECTOMY;  Surgeon: Debby LABOR. Cornett, MD;  Location: [facility];  Service: General;  Laterality: N/A;   COLONOSCOPY     numerous times   DILATION AND CURETTAGE OF UTERUS     ESOPHAGOGASTRODUODENOSCOPY     HEMORRHOID SURGERY  1993   x3   IUD REMOVAL  02/2015   Mirena   LAPAROSCOPIC LYSIS OF ADHESIONS N/A 10/16/2012   Procedure: LAPAROSCOPIC LYSIS OF ADHESIONS;  Surgeon: Debby A. Cornett, MD;  Location: [facility];  Service: General;  Laterality: N/A;   LAPAROSCOPY N/A 10/16/2012   Procedure: LAPAROSCOPY DIAGNOSTIC;  Surgeon: Debby LABOR. Cornett, MD;  Location: [facility];  Service: General;  Laterality: N/A;   PELVIC LAPAROSCOPY     RADIOLOGY WITH ANESTHESIA N/A 12/16/2015   Procedure: MRI OF BRAIN WITH AND WITHOUT CONTRAST;  Surgeon: Medication Radiologist, MD;  Location: [facility];  Service: Radiology;  Laterality: N/A;   SHOULDER SURGERY  2007/2008   SPINE SURGERY  2010   cervical   Patient Active Problem List   Diagnosis Date Noted   Sensorineural hearing loss, bilateral 11/19/2023   GAD (generalized anxiety disorder)  12/26/2017   OCD (obsessive compulsive disorder) 12/26/2017   PTSD (post-traumatic stress disorder) 12/26/2017   DDD (degenerative disc disease), cervical 07/14/2016   Primary osteoarthritis of both feet 07/14/2016   Primary osteoarthritis of both hands 07/14/2016   Other fatigue 07/14/2016   History of IBS 07/14/2016   Osteopenia of multiple sites 07/14/2016   Fibromyalgia 01/13/2016   MGUS (monoclonal gammopathy of unknown significance) 12/23/2013   Chronic cholecystitis without calculus 10/18/2012   Abdominal pain 10/18/2012   Nausea alone 10/18/2012   Constipation 10/18/2012   Depression 10/18/2012   Anxiety    ASCUS (atypical squamous cells of undetermined significance) on Pap smear    IUD    Hemorrhoids 11/29/2010   Abdominal pain, left upper quadrant 11/29/2010    PCP:  Joen Gentry, MD  REFERRING PROVIDER: Joen Gentry, MD  REFERRING DIAG:  fibromyalgia  THERAPY DIAG:  Abnormal posture  Cramp and spasm  Polymyalgia rheumatica  Muscle weakness (generalized)  Pain in right foot  Pain in left foot  Rationale for Evaluation and Treatment Rehabilitation  ONSET DATE: chronic pain (fibromyalgia), bowel flare up 1.5 months ago  SUBJECTIVE:                                                                                                                                                                                                         SUBJECTIVE STATEMENT:  Feeling much better,    PERTINENT HISTORY:  Anxiety, bipolar disorder, depression, fibromyalgia, IBS, migraines, osteopenia, PTSD  PAIN: 12/05/23 Are you having pain: no pain today Pain location:   Pain rating:  Pain description: irritating, annoying   Aggravating factors: doing too much, stress  Relieving factors: muscle relaxers, rest, stretching  PRECAUTIONS: Other: chronic pain syndrome and depression Other: slow progression with exercise due to chronic condition  WEIGHT BEARING RESTRICTIONS No  FALLS:  Has patient fallen in last 6 months? No  LIVING ENVIRONMENT: Lives with: lives with their family Lives in: House/apartment  OCCUPATION: on disability  PLOF: Independent with basic ADLs and Leisure: yardwork, walking  Pt cares for her 4 young grandchildren- difficulty with care including lifting and carrying  PATIENT GOALS be more active with less pain, exercise at the gym regularly, lift and carry grandchildren and laundry, improved ease with yardwork and housework, resume exercise  OBJECTIVE:   DIAGNOSTIC FINDINGS: none recent   PATIENT SURVEYS:  The Patient-Specific Functional Scale  Initial:  I am going to ask you to identify up to 3 important activities that you are unable to do or are having difficulty with as a  result of this problem.  Today are there any activities that you are unable to do or having difficulty with because of this?   (Patient shown scale and patient rated each activity)  Follow up: When you first came in you had difficulty performing these activities.  Today do you still have difficulty?  Patient-Specific activity scoring scheme (Point to one number):  0 1 2 3 4 5 6 7 8 9  10 Unable                                                                                                          Able to perform To perform                                                                                                    activity at the same Activity         Level as before                                                                                                                       Injury or problem  Activity   Lifting laundry and groceries/housework                           initial:  3/10  11/14/23: 5/10  2.    Yard work- up and down with squatting          Initial: 2/10  11/14/23: 3/10     COGNITION: Overall cognitive status: Within functional limits for tasks assessed  SENSATION: WFL  POSTURE: rounded shoulders and forward head  PALPATION: Diffuse palpable tenderness over bil neck, upper traps, thoracic, lumbar and gluteals with trigger points.    CERVICAL ROM:   Stiff at end range and pain on Rt with Lt motions.  All WFLs  UPPER EXTREMITY ROM: UE A/ROM is limited by 20% into flexion and abduction with pain at end range.  Hip flexibility is limited by 25% in all directions with pain in all directions.  UPPER EXTREMITY MMT: UE: 4+/5, LE 4+/5 except hip flexors 4-/5  TODAY'S TREATMENT:  12/12/23:Pt arrives for aquatic physical therapy. Treatment took place in 3.5-5.5 feet of water. Water temperature was 91 degres F. Pt entered the pool via stairs independently.  -50% depth water walking with UE push pull with yellow floats 10x each direction. VC to walk as fast as she could to create a good resistive current.Ankle fins used. - High knee march 6x holding yellow floats by her side. Vc to  keep heart lifted.  -Bil hip lifts 3 directions with ankle fins 20x each -Pink bells core work: static stance arms forward and back 2x20 VC to increase speed -Standing against wall core/lat press with 2 rainbow floats 3x10 -Sit to stand from 3rd step 2x10 -Horseback bicycle on yellow 7 min, ankle fins   12/05/23:  Nustep level 3 for 10 min- PT monitored and discussed pt status Biceps curls: 6# 2x10 -seated on ball  Ball roll outs 3 ways x10 each  Seated hamstring stretch 3x20 seconds  Seated on ball: 3# 3 way raises 2x10 Seated overhead press: 3# 2x10- seated on ball Sit to stand: 5# parallel and staggered stance x10 each-with high pull [employer] Carry: 5# around cancer gym x2 laps each hand  11/28/23:Pt arrives for aquatic physical therapy. Treatment took place in 3.5-5.5 feet of water. Water temperature was 91 degres F. Pt entered the pool via stairs independently.  -50% depth water walking with UE push pull with yellow floats 10x each direction. VC to walk as fast as she could to create a good resistive  current.Ankle fins added - High knee march 6x holding rainbow floats by her side. Vc to keep heart lifted.  -Bil hip lifts 3 directions with ankle fins 20x each -Pink bells core work: static stance arms forward and back 2x20 -Standing against wall core/lat press with 2 rainbow floats 3x10 -Horseback bicycle on yellow 6 min, ankle fins    HOME EXERCISE PROGRAM:  Access Code: [email or web address]- previously provided  Access Code: [email or web address] URL: [email or web address] Date: 09/19/2023 Prepared by: Burnard  Exercises - Seated Cervical Flexion AROM  - 3 x daily - 7 x weekly - 1 sets - 3 reps - 20 hold - Seated Cervical Sidebending AROM  - 3 x daily - 7 x weekly - 1 sets - 3 reps - 20 hold - Seated Cervical Rotation AROM  - 3 x daily - 7 x weekly - 1 sets - 3 reps - 20 hold - Seated Correct Posture  - 1 x daily - 7 x weekly - 3 sets - 10 reps - Seated Diaphragmatic Breathing  - 5 x daily - 7 x weekly - 1 sets - 10 reps - Sidelying Open Book Thoracic Rotation with Knee on Foam Roll  - 2 x daily - 7 x weekly - 3 sets - 10 reps - Seated Transversus Abdominis Bracing  - 5 x daily - 7 x weekly - 1 sets - 10 reps - 5 hold - Seated Hamstring Stretch  - 3 x daily - 7 x weekly - 1 sets - 3 reps - 20 hold ASSESSMENT:  CLINICAL IMPRESSION: Pt very pleased she has been able to participate in a home/land program that is gentle enough and then exercise with a little more intensity both load wise and cardiovascularly in the pool. Tolerated all load increase very well today.     OBJECTIVE IMPAIRMENTS decreased activity tolerance, decreased endurance, difficulty walking, decreased strength, increased muscle spasms, impaired flexibility, improper body mechanics, postural dysfunction, and pain.   ACTIVITY LIMITATIONS carrying, lifting, sitting, standing, reach over head, hygiene/grooming, and locomotion level  PARTICIPATION LIMITATIONS: meal prep, cleaning, laundry, driving, community activity, and yard  work  PERSONAL FACTORS Past/current experiences, Time since onset of injury/illness/exacerbation, and 3+ comorbidities: fibromyalgia depression, anxiety,  are also affecting patient's functional outcome.   REHAB POTENTIAL: Good  CLINICAL DECISION MAKING: Evolving/moderate complexity  EVALUATION COMPLEXITY: Moderate   GOALS: Goals reviewed with patient? Yes  SHORT TERM GOALS: Target date: 10/17/2023    Return to regular performance of HEP 4-5x/week to improve mobility  Baseline: 3-5x/wk (met) Goal status: MET  2.  Rate housework > or = to 4/10 on patient specific functional scale (PSFS) Baseline: 5/10 Goal status: MET  3.  Verbalize and demonstrate abdominal massage daily to reduce GI pain Baseline:  Goal status: Met 8/11    LONG TERM GOALS: Target date: 02/04/24    Be independent in advanced HEP Baseline:  Goal status:  Ongoing  2.  Rate squatting for yard  work > or = to 4/10 on PSFS Baseline: 3/10 (11/14/23) Goal status:NEW  3.  Report > or = to 70% reduction in neck and thoracic pain with daily tasks  Baseline: 50% (11/14/23) Goal status: Revised   4.  Walk for exercise and perform regular exercises 3-5x/wk for exercise to improve endurance  Baseline: some weeks, more consistent now (11/14/23) Goal status: in progress   5.  Rate housework > or = to 6/10 on PSFS  Baseline: 5/10  (11/14/23) Goal status: revised    PLAN: PT FREQUENCY: 1-2x/week  PT DURATION: 12 weeks  PLANNED INTERVENTIONS: 97164- PT Re-evaluation, 97110-Therapeutic exercises, 97530- Therapeutic activity, 97112- Neuromuscular re-education, 97535- Self Care, [id number]- Manual therapy, [phone]- Gait training, [phone]- Aquatic Therapy, [phone]- Electrical stimulation (unattended), [phone]- Electrical stimulation (manual), [id number]- Traction (mechanical), 20560 (1-2 muscles), 20561 (3+ muscles)- Dry Needling, Patient/Family education, Balance training, Taping, Joint mobilization, Spinal manipulation, Spinal  mobilization, Cryotherapy, Moist heat, Therapeutic exercises, Therapeutic activity, Neuromuscular re-education, and Self Care  PLAN FOR NEXT SESSION:: continued advancement of mobility, strength and manual to address tension   Delon Darner, PTA 12/12/23 9:22 AM

## 2023-12-13 DIAGNOSIS — Z1331 Encounter for screening for depression: Secondary | ICD-10-CM | POA: Diagnosis not present

## 2023-12-13 DIAGNOSIS — Z124 Encounter for screening for malignant neoplasm of cervix: Secondary | ICD-10-CM | POA: Diagnosis not present

## 2023-12-13 DIAGNOSIS — Z01419 Encounter for gynecological examination (general) (routine) without abnormal findings: Secondary | ICD-10-CM | POA: Diagnosis not present

## 2023-12-13 NOTE — Progress Notes (Signed)
 [facility] Counselor/Therapist Progress Note  Patient ID: Danielle Bruce, MRN: [id number],    Date:12/11/2023  Time Spent: 60 minutes Time In: 3:00  Time out:4:00  Treatment Type: Individual Therapy  Reported Symptoms: flashbacks, responding to triggers, depression, anxiety, being busy all of the time, dissociation  Mental Status Exam: Appearance:  Casual     Behavior: Appropriate  Motor: normal  Speech/Language:  Clear and Coherent  Affect: Blunt  Mood: pleasant  Thought process: normal  Thought content:   WNL  Sensory/Perceptual disturbances:   WNL  Orientation: oriented to person, place, time/date, and situation  Attention: Good  Concentration: Good  Memory: WNL  Fund of knowledge:  Good  Insight:   Good  Judgment:  Fair  Impulse Control: Good   Risk Assessment: Danger to Self:  No Self-injurious Behavior: No Danger to Others: No Duty to Warn:no Physical Aggression / Violence:No  Access to Firearms a concern: No  Gang Involvement:No   Subjective: The patient attended a face-to-face individual therapy in the office today.  The patient presents as pleasant and cooperative.  The patient reports that she had some good interactions with her children over the last week.  We talked about those interactions that she has had.  She did seem surprised at how powerful the EMDR was and we have not even really started that process that much so far.  She seemed to be in a good space for the most part but I think we may need to add 1 more thing to do EMDR about which is about her feelings about how she is as a mother.  We do have a list of items that we will start with and target but I think we will need to add that to the list and that came out today because she became very reactive and emotional related to feeling ashamed of how she parented her children. Interventions: Cognitive Behavioral Therapy, Mindfulness Meditation, Eye Movement Desensitization and Reprocessing  (EMDR), Insight-Oriented, and Interpersonal, psychoeducation  Diagnosis:PTSD (post-traumatic stress disorder)  Attention deficit hyperactivity disorder (ADHD), combined type  Mixed obsessional thoughts and acts  Plan: Plan of Care: Client Abilities/Strengths  Insightful, motivated, Supportive family Client Treatment Preferences  Outpatient Individual therapy/EMDR  Client Statement of Needs  I think I have PTSD and I feel like I need another kind of therapy than talk therapy Treatment Level  Outpatient Individual therapy  Symptoms  Demonstrates an exaggerated startle response.:  (Status: maintained). Depressed  or irritable mood.: (Status:improved). Describes a reliving of the event,  particularly through dissociative flashbacks.: (Status: maintained). Displays a  significant decline in interest and engagement in activities.:  (Status: improved). Displays significant psychological and/or physiological distress resulting from internal and external  clues that are reminiscent of the traumatic event.: (Status: maintained).  Experiences disturbances in sleep.:  (Status: improved). Experiences disturbing  and persistent thoughts, images, and/or perceptions of the traumatic event.:  (Status: maintained). Experiences frequent nightmares.: (Status: maintained).  Feelings of hopelessness, worthlessness, or inappropriate guilt.: No Description Entered (Status:  improved). Has been exposed to a traumatic event involving actual or perceived threat of death or  serious injury.:  (Status: maintained). Impairment in social, occupational, or  other areas of functioning.:(Status: maintained). Intentionally avoids activities,  places, people, or objects (e.g., up-armored vehicles) that evoke memories of the event.: (Status: maintained). Intentionally avoids thoughts, feelings, or discussions related  to the traumatic event.: (Status: maintained). Reports difficulty concentrating as  well as feelings  of guilt (Status:maintained). Reports response  of intense fear,  helplessness, or horror to the traumatic event.:  (Status: maintained).  Problems Addressed  Unipolar Depression, Posttraumatic Stress Disorder (PTSD), Posttraumatic Stress Disorder (PTSD),   Posttraumatic Stress Disorder (PTSD), Posttraumatic Stress Disorder (PTSD)  Goals 1. Develop healthy thinking patterns and beliefs about self, others, and the world that lead to the alleviation and help prevent the relapse of  depression. Objective Identify and replace thoughts and beliefs that support depression. Target Date: 2025/01/03 Frequency: Weekly Progress: 70 Modality: individual Related Interventions 1. Explore and restructure underlying assumptions and beliefs reflected in biased self-talk that  may put the client at risk for relapse or recurrence. 2. Conduct Cognitive-Behavioral Therapy (see Cognitive Behavior Therapy by Almarie; Overcoming Depression by Marine dunker al.), beginning with helping the client learn the connection among  cognition, depressive feelings, and actions. 2. Eliminate or reduce the negative impact trauma related symptoms have  on social, occupational, and family functioning. Objective Learn and implement personal skills to manage challenging situations related to trauma. Target Date: 2025/01/03 Frequency: Weekly Progress: 30 Modality: individual 3. No longer avoids persons, places, activities, and objects that are  reminiscent of the traumatic event. Objective Participate in Eye Movement Desensitization and Reprocessing (EMDR) to reduce emotional distress  related to traumatic thoughts, feelings, and images. Target Date: 2025/01/03 Frequency: Weekly Progress: 10 Modality: individual   Related Interventions 1. Utilize Eye Movement Desensitization and Reprocessing (EMDR) to reduce the client's  emotional reactivity to the traumatic event and reduce PTSD symptoms. Objective Learn and implement  guided self-dialogue to manage thoughts, feelings, and urges brought on by  encounters with trauma-related situations. Target Date: 2026-11/07 Frequency: Weekly Progress: 20 Modality: individual Related Interventions 1. Teach the client a guided self-dialogue procedure in which he/she learns to recognize  maladaptive self-talk, challenges its biases, copes with engendered feelings, overcomes  avoidance, and reinforces his/her accomplishments; review and reinforce progress, problemsolve obstacles. 4. No longer experiences intrusive event recollections, avoidance of event  reminders, intense arousal, or disinterest in activities or  relationships. 5. Thinks about or openly discusses the traumatic event with others  without experiencing psychological or physiological distress. Diagnosis Axis  none F43.10 (Posttraumatic stress disorder) - Open - [Signifier: n/a] Posttraumatic Stress  Disorder  Conditions For Discharge Achievement of treatment goals and objectives   Shakesha Soltau G Myking Sar, LCSW

## 2023-12-13 NOTE — Progress Notes (Signed)
 VIALS MADE 12-13-23

## 2023-12-14 ENCOUNTER — Other Ambulatory Visit (HOSPITAL_COMMUNITY): Payer: Self-pay

## 2023-12-14 ENCOUNTER — Telehealth: Payer: Self-pay

## 2023-12-14 DIAGNOSIS — J3081 Allergic rhinitis due to animal (cat) (dog) hair and dander: Secondary | ICD-10-CM | POA: Diagnosis not present

## 2023-12-14 NOTE — Telephone Encounter (Signed)
*  [facility]  Pharmacy Patient Advocate Encounter   Received notification from Fax that prior authorization for Symbicort is required/requested.   Insurance verification completed.   The patient is insured through [employer].   Per test claim: Refill too soon. PA is not needed at this time. Medication was filled 12/12/2023. Next eligible fill date is 02/17/2024.

## 2023-12-18 ENCOUNTER — Ambulatory Visit (INDEPENDENT_AMBULATORY_CARE_PROVIDER_SITE_OTHER): Admitting: Psychology

## 2023-12-18 DIAGNOSIS — F4310 Post-traumatic stress disorder, unspecified: Secondary | ICD-10-CM | POA: Diagnosis not present

## 2023-12-18 DIAGNOSIS — F902 Attention-deficit hyperactivity disorder, combined type: Secondary | ICD-10-CM

## 2023-12-18 DIAGNOSIS — F422 Mixed obsessional thoughts and acts: Secondary | ICD-10-CM

## 2023-12-19 ENCOUNTER — Other Ambulatory Visit (HOSPITAL_COMMUNITY): Payer: Self-pay

## 2023-12-19 ENCOUNTER — Other Ambulatory Visit: Payer: Self-pay | Admitting: Gastroenterology

## 2023-12-19 ENCOUNTER — Ambulatory Visit
Admission: RE | Admit: 2023-12-19 | Discharge: 2023-12-19 | Disposition: A | Discharge status: Home / Self Care | Source: Ambulatory Visit | Attending: Gastroenterology | Admitting: Gastroenterology

## 2023-12-19 ENCOUNTER — Ambulatory Visit

## 2023-12-19 ENCOUNTER — Telehealth: Payer: Self-pay

## 2023-12-19 DIAGNOSIS — R293 Abnormal posture: Secondary | ICD-10-CM

## 2023-12-19 DIAGNOSIS — J4520 Mild intermittent asthma, uncomplicated: Secondary | ICD-10-CM

## 2023-12-19 DIAGNOSIS — R252 Cramp and spasm: Secondary | ICD-10-CM | POA: Diagnosis not present

## 2023-12-19 DIAGNOSIS — M79671 Pain in right foot: Secondary | ICD-10-CM | POA: Diagnosis not present

## 2023-12-19 DIAGNOSIS — R109 Unspecified abdominal pain: Secondary | ICD-10-CM

## 2023-12-19 DIAGNOSIS — M353 Polymyalgia rheumatica: Secondary | ICD-10-CM | POA: Diagnosis not present

## 2023-12-19 DIAGNOSIS — R194 Change in bowel habit: Secondary | ICD-10-CM

## 2023-12-19 DIAGNOSIS — Z8719 Personal history of other diseases of the digestive system: Secondary | ICD-10-CM | POA: Diagnosis not present

## 2023-12-19 DIAGNOSIS — M6281 Muscle weakness (generalized): Secondary | ICD-10-CM

## 2023-12-19 DIAGNOSIS — M79672 Pain in left foot: Secondary | ICD-10-CM | POA: Diagnosis not present

## 2023-12-19 NOTE — Progress Notes (Signed)
 [facility] Counselor/Therapist Progress Note  Patient ID: Danielle Bruce, MRN: [id number],    Date:12/18/2023  Time Spent: 60 minutes Time In: 3:00  Time out:4:00  Treatment Type: Individual Therapy  Reported Symptoms: flashbacks, responding to triggers, depression, anxiety, being busy all of the time, dissociation  Mental Status Exam: Appearance:  Casual     Behavior: Appropriate  Motor: normal  Speech/Language:  Clear and Coherent  Affect: Blunt  Mood: pleasant  Thought process: normal  Thought content:   WNL  Sensory/Perceptual disturbances:   WNL  Orientation: oriented to person, place, time/date, and situation  Attention: Good  Concentration: Good  Memory: WNL  Fund of knowledge:  Good  Insight:   Good  Judgment:  Fair  Impulse Control: Good   Risk Assessment: Danger to Self:  No Self-injurious Behavior: No Danger to Others: No Duty to Warn:no Physical Aggression / Violence:No  Access to Firearms a concern: No  Gang Involvement:No   Subjective: The patient attended a face-to-face individual therapy in the office today.  The patient presents as pleasant and cooperative.  The patient does seem a little anxious today and we discussed the situation with her trans daughter and her difficulty getting therapy.  We did do some problem-solving to see if we could come up with a referral or someone that could possibly provide what [facility] needs.  We will think about this further and see if we can help alleviate some of the patient's stress of having to worry about [facility] getting the proper treatment.  We will resume working on the EMDR as soon as we can get this situation a little more resolved.  Interventions: Cognitive Behavioral Therapy, Mindfulness Meditation, Eye Movement Desensitization and Reprocessing (EMDR), Insight-Oriented, and Interpersonal, psychoeducation  Diagnosis:PTSD (post-traumatic stress disorder)  Attention deficit hyperactivity disorder  (ADHD), combined type  Mixed obsessional thoughts and acts  Plan: Plan of Care: Client Abilities/Strengths  Insightful, motivated, Supportive family Client Treatment Preferences  Outpatient Individual therapy/EMDR  Client Statement of Needs  I think I have PTSD and I feel like I need another kind of therapy than talk therapy Treatment Level  Outpatient Individual therapy  Symptoms  Demonstrates an exaggerated startle response.:  (Status: maintained). Depressed  or irritable mood.: (Status:improved). Describes a reliving of the event,  particularly through dissociative flashbacks.: (Status: maintained). Displays a  significant decline in interest and engagement in activities.:  (Status: improved). Displays significant psychological and/or physiological distress resulting from internal and external  clues that are reminiscent of the traumatic event.: (Status: maintained).  Experiences disturbances in sleep.:  (Status: improved). Experiences disturbing  and persistent thoughts, images, and/or perceptions of the traumatic event.:  (Status: maintained). Experiences frequent nightmares.: (Status: maintained).  Feelings of hopelessness, worthlessness, or inappropriate guilt.: No Description Entered (Status:  improved). Has been exposed to a traumatic event involving actual or perceived threat of death or  serious injury.:  (Status: maintained). Impairment in social, occupational, or  other areas of functioning.:(Status: maintained). Intentionally avoids activities,  places, people, or objects (e.g., up-armored vehicles) that evoke memories of the event.: (Status: maintained). Intentionally avoids thoughts, feelings, or discussions related  to the traumatic event.: (Status: maintained). Reports difficulty concentrating as  well as feelings of guilt (Status:maintained). Reports response of intense fear,  helplessness, or horror to the traumatic event.:  (Status: maintained).  Problems  Addressed  Unipolar Depression, Posttraumatic Stress Disorder (PTSD), Posttraumatic Stress Disorder (PTSD),   Posttraumatic Stress Disorder (PTSD), Posttraumatic Stress Disorder (PTSD)  Goals 1. Develop  healthy thinking patterns and beliefs about self, others, and the world that lead to the alleviation and help prevent the relapse of  depression. Objective Identify and replace thoughts and beliefs that support depression. Target Date: 2025/01/03 Frequency: Weekly Progress: 70 Modality: individual Related Interventions 1. Explore and restructure underlying assumptions and beliefs reflected in biased self-talk that  may put the client at risk for relapse or recurrence. 2. Conduct Cognitive-Behavioral Therapy (see Cognitive Behavior Therapy by Almarie; Overcoming Depression by Marine dunker al.), beginning with helping the client learn the connection among  cognition, depressive feelings, and actions. 2. Eliminate or reduce the negative impact trauma related symptoms have  on social, occupational, and family functioning. Objective Learn and implement personal skills to manage challenging situations related to trauma. Target Date: 2025/01/03 Frequency: Weekly Progress: 30 Modality: individual 3. No longer avoids persons, places, activities, and objects that are  reminiscent of the traumatic event. Objective Participate in Eye Movement Desensitization and Reprocessing (EMDR) to reduce emotional distress  related to traumatic thoughts, feelings, and images. Target Date: 2025/01/03 Frequency: Weekly Progress: 10 Modality: individual   Related Interventions 1. Utilize Eye Movement Desensitization and Reprocessing (EMDR) to reduce the client's  emotional reactivity to the traumatic event and reduce PTSD symptoms. Objective Learn and implement guided self-dialogue to manage thoughts, feelings, and urges brought on by  encounters with trauma-related situations. Target Date: 2026-11/07  Frequency: Weekly Progress: 20 Modality: individual Related Interventions 1. Teach the client a guided self-dialogue procedure in which he/she learns to recognize  maladaptive self-talk, challenges its biases, copes with engendered feelings, overcomes  avoidance, and reinforces his/her accomplishments; review and reinforce progress, problemsolve obstacles. 4. No longer experiences intrusive event recollections, avoidance of event  reminders, intense arousal, or disinterest in activities or  relationships. 5. Thinks about or openly discusses the traumatic event with others  without experiencing psychological or physiological distress. Diagnosis Axis  none F43.10 (Posttraumatic stress disorder) - Open - [Signifier: n/a] Posttraumatic Stress  Disorder  Conditions For Discharge Achievement of treatment goals and objectives   B[employer] G Victorina Kable, LCSW

## 2023-12-19 NOTE — Telephone Encounter (Signed)
*  [facility]  Pharmacy Patient Advocate Encounter   Received notification from Fax that prior authorization for Albuterol  Sulfate (2.5 MG/3ML)0.083% nebulizer solution  is required/requested.   Insurance verification completed.   The patient is insured through [employer].   Per test claim: Medication is not eligible for pharmacy benefits and must be billed through medical insurance. As our team only handles pharmacy related prior auths, medical PA's must be submitted by the clinic. Thank you

## 2023-12-19 NOTE — Therapy (Signed)
 OUTPATIENT PHYSICAL THERAPY TREATMENT     Patient Name: Danielle Bruce MRN: [id number] DOB:1960-04-09, 64 y.o., female Today's Date: 12/19/2023       PT End of Session - 12/19/23 1312     Visit Number 14    Date for Recertification  02/04/24    Authorization Type Medicare-KX needed    Progress Note Due on Visit 19    PT Start Time 1232    PT Stop Time 1312    PT Time Calculation (min) 40 min    Activity Tolerance Patient tolerated treatment well    Behavior During Therapy [facility] for tasks assessed/performed                            Past Medical History:  Diagnosis Date   Abdominal pain, unspecified site 10/18/2012   Anemia    Anxiety    Arthritis    osteoarthritis   ASCUS (atypical squamous cells of undetermined significance) on Pap smear 05/06/2005   NEG HIGH RISK HPV--C&B BIOPSY BENIGN 12/2005   Asthma    Bipolar 2 disorder (HCC)    Cancer (HCC)    skin cancer - basal cell   Colon polyps    Complication of anesthesia    anxious afterwards, will get headaches    Constipation    Depression    Fibromyalgia 10/2013   GERD (gastroesophageal reflux disease)    Hearing loss on left    Heart murmur    never had any problems   Hemorrhoids    High cholesterol    High risk HPV infection 08/2011   cytology negative   IBS (irritable bowel syndrome)    Insomnia    Lymphocytic colitis    Lymphoma (HCC)    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   Bone marrow biopsy showes 8% plasma cells IgA Lambda   Migraines    Nausea alone 10/18/2012   Osteoarthritis    Osteopenia    Peripheral neuropathy    PTSD (post-traumatic stress disorder)    Past Surgical History:  Procedure Laterality Date   BONE MARROW BIOPSY Left 12/18/2013   Plasma cell dyscrasia 8% population of plasma cells   BREAST BIOPSY Right    benign stereo   CESAREAN SECTION  13,11,06   CHOLECYSTECTOMY N/A 10/16/2012   Procedure: LAPAROSCOPIC CHOLECYSTECTOMY;   Surgeon: Debby LABOR. Cornett, MD;  Location: [facility];  Service: General;  Laterality: N/A;   COLONOSCOPY     numerous times   DILATION AND CURETTAGE OF UTERUS     ESOPHAGOGASTRODUODENOSCOPY     HEMORRHOID SURGERY  1993   x3   IUD REMOVAL  02/2015   Mirena   LAPAROSCOPIC LYSIS OF ADHESIONS N/A 10/16/2012   Procedure: LAPAROSCOPIC LYSIS OF ADHESIONS;  Surgeon: Debby A. Cornett, MD;  Location: [facility];  Service: General;  Laterality: N/A;   LAPAROSCOPY N/A 10/16/2012   Procedure: LAPAROSCOPY DIAGNOSTIC;  Surgeon: Debby LABOR. Cornett, MD;  Location: [facility];  Service: General;  Laterality: N/A;   PELVIC LAPAROSCOPY     RADIOLOGY WITH ANESTHESIA N/A 12/16/2015   Procedure: MRI OF BRAIN WITH AND WITHOUT CONTRAST;  Surgeon: Medication Radiologist, MD;  Location: [facility];  Service: Radiology;  Laterality: N/A;   SHOULDER SURGERY  2007/2008   SPINE SURGERY  2010   cervical   Patient Active Problem List   Diagnosis Date Noted   Sensorineural hearing loss, bilateral 11/19/2023   GAD (generalized anxiety disorder) 12/26/2017  OCD (obsessive compulsive disorder) 12/26/2017   PTSD (post-traumatic stress disorder) 12/26/2017   DDD (degenerative disc disease), cervical 07/14/2016   Primary osteoarthritis of both feet 07/14/2016   Primary osteoarthritis of both hands 07/14/2016   Other fatigue 07/14/2016   History of IBS 07/14/2016   Osteopenia of multiple sites 07/14/2016   Fibromyalgia 01/13/2016   MGUS (monoclonal gammopathy of unknown significance) 12/23/2013   Chronic cholecystitis without calculus 10/18/2012   Abdominal pain 10/18/2012   Nausea alone 10/18/2012   Constipation 10/18/2012   Depression 10/18/2012   Anxiety    ASCUS (atypical squamous cells of undetermined significance) on Pap smear    IUD    Hemorrhoids 11/29/2010   Abdominal pain, left upper quadrant 11/29/2010    PCP:  Joen Gentry, MD  REFERRING PROVIDER: Joen Gentry, MD  REFERRING DIAG: fibromyalgia  THERAPY  DIAG:  Abnormal posture  Cramp and spasm  Polymyalgia rheumatica  Muscle weakness (generalized)  Rationale for Evaluation and Treatment Rehabilitation  ONSET DATE: chronic pain (fibromyalgia), bowel flare up 1.5 months ago  SUBJECTIVE:                                                                                                                                                                                                         SUBJECTIVE STATEMENT:  My GI stuff has been acting up today.  I see the GI doctor today. I've been exercising at home and incorporating things at home    PERTINENT HISTORY:  Anxiety, bipolar disorder, depression, fibromyalgia, IBS, migraines, osteopenia, PTSD  PAIN: 12/19/23 Are you having pain: no pain today Pain location:   Pain rating:  Pain description: irritating, annoying   Aggravating factors: doing too much, stress  Relieving factors: muscle relaxers, rest, stretching  PRECAUTIONS: Other: chronic pain syndrome and depression Other: slow progression with exercise due to chronic condition  WEIGHT BEARING RESTRICTIONS No  FALLS:  Has patient fallen in last 6 months? No  LIVING ENVIRONMENT: Lives with: lives with their family Lives in: House/apartment  OCCUPATION: on disability  PLOF: Independent with basic ADLs and Leisure: yardwork, walking  Pt cares for her 4 young grandchildren- difficulty with care including lifting and carrying  PATIENT GOALS be more active with less pain, exercise at the gym regularly, lift and carry grandchildren and laundry, improved ease with yardwork and housework, resume exercise  OBJECTIVE:   DIAGNOSTIC FINDINGS: none recent   PATIENT SURVEYS:  The Patient-Specific Functional Scale  Initial:  I am going to ask you to identify up to 3 important activities that you are unable  to do or are having difficulty with as a result of this problem.  Today are there any activities that you are unable to do or  having difficulty with because of this?  (Patient shown scale and patient rated each activity)  Follow up: When you first came in you had difficulty performing these activities.  Today do you still have difficulty?  Patient-Specific activity scoring scheme (Point to one number):  0 1 2 3 4 5 6 7 8 9  10 Unable                                                                                                          Able to perform To perform                                                                                                    activity at the same Activity         Level as before                                                                                                                       Injury or problem  Activity   Lifting laundry and groceries/housework                           initial:  3/10  11/14/23: 5/10  2.    Yard work- up and down with squatting          Initial: 2/10  11/14/23: 3/10     COGNITION: Overall cognitive status: Within functional limits for tasks assessed  SENSATION: WFL  POSTURE: rounded shoulders and forward head  PALPATION: Diffuse palpable tenderness over bil neck, upper traps, thoracic, lumbar and gluteals with trigger points.    CERVICAL ROM:   Stiff at end range and pain on Rt with Lt motions.  All WFLs  UPPER EXTREMITY ROM: UE A/ROM is limited by 20% into flexion and abduction with pain at end range.  Hip flexibility is limited by 25% in all directions with pain in all directions.  UPPER EXTREMITY MMT: UE: 4+/5, LE  4+/5 except hip flexors 4-/5  TODAY'S TREATMENT:  12/19/23:  Nustep level 3 for 10 min- PT monitored and discussed pt status Biceps curls: 6# 2x10 -seated on ball  Ball roll outs 3 ways x10 each  Seated hamstring stretch 3x20 seconds  Seated on ball: 3# 3 way raises 2x10 Sit to stand: 5# parallel and staggered stance x10 each-with high pull Manual: elongation and release to neck, suboccipitals, passive upper trap stretch, pec stretch and mobs with movement-lateral and PA   12/12/23:Pt arrives for aquatic physical therapy. Treatment took place in 3.5-5.5 feet of water. Water temperature was 91 degres F. Pt entered the pool via stairs independently.  -50% depth water walking with UE push pull with yellow floats 10x each direction. VC to walk as fast as she could to create a good resistive current.Ankle fins used. - High knee march 6x holding yellow floats by her side. Vc to keep heart lifted.  -Bil hip lifts 3 directions with ankle fins 20x each -Pink bells core work: static stance arms forward and back 2x20 VC to increase speed -Standing against wall core/lat press with 2 rainbow floats 3x10 -Sit to stand from 3rd step 2x10 -Horseback bicycle on yellow 7 min, ankle fins   12/05/23:  Nustep level 3 for 10 min- PT monitored and discussed pt status Biceps curls: 6# 2x10 -seated on ball  Ball roll outs 3 ways x10 each  Seated hamstring stretch 3x20 seconds  Seated on ball: 3# 3 way raises 2x10 Seated overhead press: 3# 2x10- seated on ball Sit to stand: 5# parallel and staggered stance x10 each-with high pull [employer]: 5# around cancer gym x2 laps each hand   HOME EXERCISE PROGRAM:  Access Code: [email or web address]- previously provided  Access Code: [email or web address] URL: [email or web address] Date: 09/19/2023 Prepared by: Burnard  Exercises - Seated Cervical Flexion AROM  - 3 x daily - 7 x weekly - 1 sets - 3 reps - 20 hold - Seated Cervical Sidebending AROM  - 3 x daily - 7 x weekly - 1 sets - 3 reps - 20 hold - Seated Cervical Rotation AROM  - 3 x daily - 7 x weekly - 1 sets - 3 reps - 20 hold - Seated Correct Posture  - 1 x daily - 7 x weekly - 3 sets - 10 reps - Seated Diaphragmatic Breathing  - 5 x daily - 7 x weekly - 1 sets - 10 reps - Sidelying Open Book Thoracic Rotation with Knee on Foam Roll  - 2 x daily - 7 x weekly - 3 sets - 10 reps - Seated Transversus Abdominis Bracing  - 5 x daily - 7 x weekly - 1 sets - 10 reps - 5 hold - Seated Hamstring Stretch  - 3 x daily - 7 x weekly - 1 sets - 3 reps - 20  hold ASSESSMENT:  CLINICAL IMPRESSION: Pt is consistent with a regular exercise routine. Overall her pain is low today and pt reports stress and tension in her neck and upper traps.  Pt responded well to manual therapy and reported reduce symptoms after treatment.  PT monitored for pain, fatigue and technique.  Patient will benefit from skilled PT to address the below impairments and improve overall function.    OBJECTIVE IMPAIRMENTS decreased activity tolerance, decreased endurance, difficulty walking, decreased strength, increased muscle spasms, impaired flexibility, improper body mechanics, postural dysfunction, and pain.   ACTIVITY LIMITATIONS carrying, lifting, sitting, standing, reach over head, hygiene/grooming, and locomotion level  PARTICIPATION LIMITATIONS: meal prep, cleaning, laundry, driving, community activity, and yard work  PERSONAL FACTORS Past/current experiences, Time since onset of injury/illness/exacerbation, and 3+ comorbidities: fibromyalgia depression, anxiety,  are also affecting patient's functional outcome.   REHAB POTENTIAL: Good  CLINICAL DECISION MAKING: Evolving/moderate complexity  EVALUATION COMPLEXITY: Moderate   GOALS: Goals reviewed with patient? Yes  SHORT TERM GOALS: Target date: 10/17/2023    Return to regular performance of HEP 4-5x/week to improve mobility  Baseline: 3-5x/wk (met) Goal status: MET  2.  Rate housework > or = to 4/10 on patient specific functional scale (PSFS) Baseline: 5/10 Goal status: MET  3.  Verbalize and demonstrate abdominal massage daily to reduce GI pain Baseline:  Goal status: Met 8/11    LONG TERM GOALS: Target date: 02/04/24    Be independent in advanced HEP Baseline:  Goal status:  Ongoing  2.  Rate squatting for yard work > or = to 4/10 on PSFS Baseline: 3/10 (11/14/23) Goal status:NEW  3.  Report > or = to 70% reduction in neck and thoracic pain with daily tasks  Baseline: 50% (11/14/23) Goal  status: Revised   4.  Walk for exercise and perform regular exercises 3-5x/wk for exercise to improve endurance  Baseline: some weeks, more consistent now (11/14/23) Goal status: in progress   5.  Rate housework > or = to 6/10 on PSFS  Baseline: 5/10  (11/14/23) Goal status: revised    PLAN: PT FREQUENCY:  1-2x/week  PT DURATION: 12 weeks  PLANNED INTERVENTIONS: 97164- PT Re-evaluation, 97110-Therapeutic exercises, 97530- Therapeutic activity, 97112- Neuromuscular re-education, 97535- Self Care, [id number]- Manual therapy, [phone]- Gait training, [phone]- Aquatic Therapy, [phone]- Electrical stimulation (unattended), [phone]- Electrical stimulation (manual), [id number]- Traction (mechanical), 20560 (1-2 muscles), 20561 (3+ muscles)- Dry Needling, Patient/Family education, Balance training, Taping, Joint mobilization, Spinal manipulation, Spinal mobilization, Cryotherapy, Moist heat, Therapeutic exercises, Therapeutic activity, Neuromuscular re-education, and Self Care  PLAN FOR NEXT SESSION:: continued advancement of mobility, strength and manual to address tension  Burnard Joy, PT 12/19/23 1:23 PM

## 2023-12-20 ENCOUNTER — Ambulatory Visit: Admitting: Allergy

## 2023-12-20 DIAGNOSIS — K5900 Constipation, unspecified: Secondary | ICD-10-CM | POA: Diagnosis not present

## 2023-12-20 MED ORDER — ALBUTEROL SULFATE (2.5 MG/3ML) 0.083% IN NEBU: 2.5000 mg | INHALATION_SOLUTION | RESPIRATORY_TRACT | 1 refills | Status: AC | PRN

## 2023-12-20 NOTE — Addendum Note (Signed)
 Addended by: ONEITA CHRISTIANS D on: 12/20/2023 03:07 PM   Modules accepted: Orders

## 2023-12-25 ENCOUNTER — Ambulatory Visit (INDEPENDENT_AMBULATORY_CARE_PROVIDER_SITE_OTHER): Admitting: Psychology

## 2023-12-25 DIAGNOSIS — F4310 Post-traumatic stress disorder, unspecified: Secondary | ICD-10-CM | POA: Diagnosis not present

## 2023-12-25 DIAGNOSIS — F422 Mixed obsessional thoughts and acts: Secondary | ICD-10-CM | POA: Diagnosis not present

## 2023-12-25 DIAGNOSIS — F902 Attention-deficit hyperactivity disorder, combined type: Secondary | ICD-10-CM

## 2023-12-26 ENCOUNTER — Ambulatory Visit: Admitting: Physical Therapy

## 2023-12-26 ENCOUNTER — Encounter: Payer: Self-pay | Admitting: Physical Therapy

## 2023-12-26 DIAGNOSIS — M353 Polymyalgia rheumatica: Secondary | ICD-10-CM

## 2023-12-26 DIAGNOSIS — M79672 Pain in left foot: Secondary | ICD-10-CM

## 2023-12-26 DIAGNOSIS — M79671 Pain in right foot: Secondary | ICD-10-CM | POA: Diagnosis not present

## 2023-12-26 DIAGNOSIS — M6281 Muscle weakness (generalized): Secondary | ICD-10-CM | POA: Diagnosis not present

## 2023-12-26 DIAGNOSIS — R252 Cramp and spasm: Secondary | ICD-10-CM

## 2023-12-26 DIAGNOSIS — R293 Abnormal posture: Secondary | ICD-10-CM
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
## 2023-12-26 NOTE — Therapy (Signed)
 OUTPATIENT PHYSICAL THERAPY TREATMENT     Patient Name: Danielle Bruce MRN: [id number] DOB:01-04-1961, 63 y.o., female Today's Date: 12/26/2023       PT End of Session - 12/26/23 1047     Visit Number 15    Date for Recertification  02/04/24    Authorization Type Medicare-KX needed    Progress Note Due on Visit 19    PT Start Time 1015    PT Stop Time 1053    PT Time Calculation (min) 38 min    Activity Tolerance Patient tolerated treatment well    Behavior During Therapy [facility] for tasks assessed/performed                             Past Medical History:  Diagnosis Date   Abdominal pain, unspecified site 10/18/2012   Anemia    Anxiety    Arthritis    osteoarthritis   ASCUS (atypical squamous cells of undetermined significance) on Pap smear 05/06/2005   NEG HIGH RISK HPV--C&B BIOPSY BENIGN 12/2005   Asthma    Bipolar 2 disorder (HCC)    Cancer (HCC)    skin cancer - basal cell   Colon polyps    Complication of anesthesia    anxious afterwards, will get headaches    Constipation    Depression    Fibromyalgia 10/2013   GERD (gastroesophageal reflux disease)    Hearing loss on left    Heart murmur    never had any problems   Hemorrhoids    High cholesterol    High risk HPV infection 08/2011   cytology negative   IBS (irritable bowel syndrome)    Insomnia    Lymphocytic colitis    Lymphoma (HCC)    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   Bone marrow biopsy showes 8% plasma cells IgA Lambda   Migraines    Nausea alone 10/18/2012   Osteoarthritis    Osteopenia    Peripheral neuropathy    PTSD (post-traumatic stress disorder)    Past Surgical History:  Procedure Laterality Date   BONE MARROW BIOPSY Left 12/18/2013   Plasma cell dyscrasia 8% population of plasma cells   BREAST BIOPSY Right    benign stereo   CESAREAN SECTION  13,11,06   CHOLECYSTECTOMY N/A 10/16/2012   Procedure: LAPAROSCOPIC CHOLECYSTECTOMY;   Surgeon: Debby LABOR. Cornett, MD;  Location: [facility];  Service: General;  Laterality: N/A;   COLONOSCOPY     numerous times   DILATION AND CURETTAGE OF UTERUS     ESOPHAGOGASTRODUODENOSCOPY     HEMORRHOID SURGERY  1993   x3   IUD REMOVAL  02/2015   Mirena   LAPAROSCOPIC LYSIS OF ADHESIONS N/A 10/16/2012   Procedure: LAPAROSCOPIC LYSIS OF ADHESIONS;  Surgeon: Debby A. Cornett, MD;  Location: [facility];  Service: General;  Laterality: N/A;   LAPAROSCOPY N/A 10/16/2012   Procedure: LAPAROSCOPY DIAGNOSTIC;  Surgeon: Debby LABOR. Cornett, MD;  Location: [facility];  Service: General;  Laterality: N/A;   PELVIC LAPAROSCOPY     RADIOLOGY WITH ANESTHESIA N/A 12/16/2015   Procedure: MRI OF BRAIN WITH AND WITHOUT CONTRAST;  Surgeon: Medication Radiologist, MD;  Location: [facility];  Service: Radiology;  Laterality: N/A;   SHOULDER SURGERY  2007/2008   SPINE SURGERY  2010   cervical   Patient Active Problem List   Diagnosis Date Noted   Sensorineural hearing loss, bilateral 11/19/2023   GAD (generalized anxiety disorder) 12/26/2017  OCD (obsessive compulsive disorder) 12/26/2017   PTSD (post-traumatic stress disorder) 12/26/2017   DDD (degenerative disc disease), cervical 07/14/2016   Primary osteoarthritis of both feet 07/14/2016   Primary osteoarthritis of both hands 07/14/2016   Other fatigue 07/14/2016   History of IBS 07/14/2016   Osteopenia of multiple sites 07/14/2016   Fibromyalgia 01/13/2016   MGUS (monoclonal gammopathy of unknown significance) 12/23/2013   Chronic cholecystitis without calculus 10/18/2012   Abdominal pain 10/18/2012   Nausea alone 10/18/2012   Constipation 10/18/2012   Depression 10/18/2012   Anxiety    ASCUS (atypical squamous cells of undetermined significance) on Pap smear    IUD    Hemorrhoids 11/29/2010   Abdominal pain, left upper quadrant 11/29/2010    PCP:  Joen Gentry, MD  REFERRING PROVIDER: Joen Gentry, MD  REFERRING DIAG: fibromyalgia  THERAPY  DIAG:  Abnormal posture  Cramp and spasm  Polymyalgia rheumatica  Muscle weakness (generalized)  Pain in left foot  Pain in right foot  Rationale for Evaluation and Treatment Rehabilitation  ONSET DATE: chronic pain (fibromyalgia), bowel flare up 1.5 months ago  SUBJECTIVE:                                                                                                                                                                                                         SUBJECTIVE STATEMENT:  Recently had to do a lot [place] an airport, [place], I did it but it cranked my Lt hip up.     PERTINENT HISTORY:  Anxiety, bipolar disorder, depression, fibromyalgia, IBS, migraines, osteopenia, PTSD  PAIN: 12/19/23 Are you having pain: Lt hip is sore 4/10 Pain location:   Pain rating:  Pain description: irritating, annoying   Aggravating factors: doing too much, stress  Relieving factors: muscle relaxers, rest, stretching  PRECAUTIONS: Other: chronic pain syndrome and depression Other: slow progression with exercise due to chronic condition  WEIGHT BEARING RESTRICTIONS No  FALLS:  Has patient fallen in last 6 months? No  LIVING ENVIRONMENT: Lives with: lives with their family Lives in: House/apartment  OCCUPATION: on disability  PLOF: Independent with basic ADLs and Leisure: yardwork, walking  Pt cares for her 4 young grandchildren- difficulty with care including lifting and carrying  PATIENT GOALS be more active with less pain, exercise at the gym regularly, lift and carry grandchildren and laundry, improved ease with yardwork and housework, resume exercise  OBJECTIVE:   DIAGNOSTIC FINDINGS: none recent   PATIENT SURVEYS:  The Patient-Specific Functional Scale  Initial:  I am going to ask you  to identify up to 3 important activities that you are unable to do or are having difficulty with as a result of this problem.  Today are there any activities  that you are unable to do or having difficulty with because of this?  (Patient shown scale and patient rated each activity)  Follow up: When you first came in you had difficulty performing these activities.  Today do you still have difficulty?  Patient-Specific activity scoring scheme (Point to one number):  0 1 2 3 4 5 6 7 8 9  10 Unable                                                                                                          Able to perform To perform                                                                                                    activity at the same Activity         Level as before                                                                                                                       Injury or problem  Activity   Lifting laundry and groceries/housework                           initial:  3/10  11/14/23: 5/10  2.    Yard work- up and down with squatting          Initial: 2/10  11/14/23: 3/10     COGNITION: Overall cognitive status: Within functional limits for tasks assessed  SENSATION: WFL  POSTURE: rounded shoulders and forward head  PALPATION: Diffuse palpable tenderness over bil neck, upper traps, thoracic, lumbar and gluteals with trigger points.    CERVICAL ROM:   Stiff at end range and pain on Rt with Lt motions.  All WFLs  UPPER EXTREMITY ROM: UE A/ROM is limited by 20% into flexion and abduction with pain at end range.  Hip flexibility is limited by 25% in all directions with  pain in all directions.  UPPER EXTREMITY MMT: UE: 4+/5, LE 4+/5 except hip flexors 4-/5  TODAY'S TREATMENT:   12/26/23:Pt arrives for aquatic physical therapy. Treatment took place in 3.5-5.5 feet of water. Water temperature was 91 degres F. Pt entered the pool via stairs independently.  -50% depth water walking with UE push pull with yellow floats 10x each direction. VC to walk as fast as she could to create a good resistive current.Ankle fins  used. - High knee march 6x holding yellow floats by her side. Vc to keep heart lifted.  -Bil hip lifts 3 directions with ankle fins 20x each -Pink bells core work: static stance arms forward and back 2x20 VC to increase speed -Standing against wall core/lat press with 2 rainbow floats 3x10 -Horseback bicycle on yellow 7 min, ankle fins  12/19/23:  Nustep level 3 for 10 min- PT monitored and discussed pt status Biceps curls: 6# 2x10 -seated on ball  Ball roll outs 3 ways x10 each  Seated hamstring stretch 3x20 seconds  Seated on ball: 3# 3 way raises 2x10 Sit to stand: 5# parallel and staggered stance x10 each-with high pull Manual: elongation and release to neck, suboccipitals, passive upper trap stretch, pec stretch and mobs with movement-lateral and PA   12/12/23:Pt arrives for aquatic physical therapy. Treatment took place in 3.5-5.5 feet of water. Water temperature was 91 degres F. Pt entered the pool via stairs independently.  -50% depth water walking with UE push pull with yellow floats 10x each direction.  VC to walk as fast as she could to create a good resistive current.Ankle fins used. - High knee march 6x holding yellow floats by her side. Vc to keep heart lifted.  -Bil hip lifts 3 directions with ankle fins 20x each -Pink bells core work: static stance arms forward and back 2x20 VC to increase speed -Standing against wall core/lat press with 2 rainbow floats 3x10 -Sit to stand from 3rd step 2x10 -Horseback bicycle on yellow 7 min, ankle fins   HOME EXERCISE PROGRAM:  Access Code: [email or web address]- previously provided  Access Code: [email or web address] URL: [email or web address] Date: 09/19/2023 Prepared by: Burnard  Exercises - Seated Cervical Flexion AROM  - 3 x daily - 7 x weekly - 1 sets - 3 reps - 20 hold - Seated Cervical Sidebending AROM  - 3 x daily - 7 x weekly - 1 sets - 3 reps - 20 hold - Seated Cervical Rotation AROM  - 3 x daily - 7 x weekly - 1 sets - 3 reps - 20 hold - Seated Correct Posture  - 1 x daily - 7 x weekly - 3 sets - 10 reps - Seated Diaphragmatic Breathing  - 5 x daily - 7 x weekly - 1 sets - 10 reps - Sidelying Open Book Thoracic Rotation with Knee on Foam Roll  - 2 x daily - 7 x weekly - 3 sets - 10 reps - Seated Transversus Abdominis Bracing  - 5 x daily - 7 x weekly - 1 sets - 10 reps - 5 hold - Seated Hamstring Stretch  - 3 x daily - 7 x weekly - 1 sets - 3 reps - 20 hold ASSESSMENT:  CLINICAL IMPRESSION: Lt hip sore from walking a lot in the airport recently. She was very proud of herself for being able to accomplish that without too much pain.Pt probably needed most cuing to slow down today with her exercises mainly because of excitement being in the water.    OBJECTIVE IMPAIRMENTS decreased activity tolerance, decreased endurance, difficulty walking, decreased strength, increased muscle spasms, impaired flexibility, improper body mechanics, postural dysfunction, and pain.   ACTIVITY LIMITATIONS carrying, lifting, sitting, standing, reach over head,  hygiene/grooming, and locomotion level  PARTICIPATION LIMITATIONS: meal prep, cleaning, laundry, driving, community activity, and yard work  PERSONAL FACTORS Past/current experiences, Time since onset of injury/illness/exacerbation, and 3+ comorbidities: fibromyalgia depression, anxiety,  are also affecting patient's functional outcome.   REHAB POTENTIAL: Good  CLINICAL DECISION MAKING: Evolving/moderate complexity  EVALUATION COMPLEXITY: Moderate   GOALS: Goals reviewed with patient? Yes  SHORT TERM GOALS: Target date: 10/17/2023    Return to regular performance of HEP 4-5x/week to improve mobility  Baseline: 3-5x/wk (met) Goal status: MET  2.  Rate housework > or = to 4/10 on patient specific functional scale (PSFS) Baseline: 5/10 Goal status: MET  3.  Verbalize and demonstrate abdominal massage daily to reduce GI pain Baseline:  Goal status: Met 8/11    LONG TERM GOALS: Target date: 02/04/24    Be  independent in advanced HEP Baseline:  Goal status:  Ongoing  2.  Rate squatting for yard work > or = to 4/10 on PSFS Baseline: 3/10 (11/14/23) Goal status:NEW  3.  Report > or = to 70% reduction in neck and thoracic pain with daily tasks  Baseline: 50% (11/14/23) Goal status: Revised   4.  Walk for exercise and perform regular exercises 3-5x/wk for exercise to improve endurance  Baseline: some weeks, more consistent now (11/14/23) Goal status: in progress   5.  Rate housework > or = to 6/10 on PSFS  Baseline: 5/10  (11/14/23) Goal status: revised    PLAN: PT FREQUENCY: 1-2x/week  PT DURATION: 12 weeks  PLANNED INTERVENTIONS: 97164- PT Re-evaluation, 97110-Therapeutic exercises, 97530- Therapeutic activity, 97112- Neuromuscular re-education, 97535- Self Care, [id number]- Manual therapy, [phone]- Gait training, [phone]- Aquatic Therapy, [phone]- Electrical stimulation (unattended), [phone]- Electrical stimulation (manual), [id number]- Traction (mechanical), 20560 (1-2 muscles), [id number]  (3+ muscles)- Dry Needling, Patient/Family education, Balance training, Taping, Joint mobilization, Spinal manipulation, Spinal mobilization, Cryotherapy, Moist heat, Therapeutic exercises, Therapeutic activity, Neuromuscular re-education, and Self Care  PLAN FOR NEXT SESSION:: continued advancement of mobility, strength and manual to address tension   Delon Darner, PTA 12/26/23 10:49 AM

## 2024-01-01 ENCOUNTER — Ambulatory Visit (INDEPENDENT_AMBULATORY_CARE_PROVIDER_SITE_OTHER): Admitting: Psychology

## 2024-01-01 DIAGNOSIS — F4310 Post-traumatic stress disorder, unspecified: Secondary | ICD-10-CM

## 2024-01-01 DIAGNOSIS — F422 Mixed obsessional thoughts and acts: Secondary | ICD-10-CM

## 2024-01-01 DIAGNOSIS — F902 Attention-deficit hyperactivity disorder, combined type: Secondary | ICD-10-CM | POA: Diagnosis not present

## 2024-01-01 NOTE — Progress Notes (Signed)
 [facility] Counselor/Therapist Progress Note  Patient ID: Danielle Bruce, MRN: [id number],    Date:01/01/2024  Time Spent: 60 minutes Time In: 3:00  Time out:4:00  Treatment Type: Individual Therapy  Reported Symptoms: flashbacks, responding to triggers, depression, anxiety, being busy all of the time, dissociation  Mental Status Exam: Appearance:  Casual     Behavior: Appropriate  Motor: normal  Speech/Language:  Clear and Coherent  Affect: Blunt  Mood: pleasant  Thought process: normal  Thought content:   WNL  Sensory/Perceptual disturbances:   WNL  Orientation: oriented to person, place, time/date, and situation  Attention: Good  Concentration: Good  Memory: WNL  Fund of knowledge:  Good  Insight:   Good  Judgment:  Fair  Impulse Control: Good   Risk Assessment: Danger to Self:  No Self-injurious Behavior: No Danger to Others: No Duty to Warn:no Physical Aggression / Violence:No  Access to Firearms a concern: No  Gang Involvement:No   Subjective: The patient attended a face-to-face individual therapy in the office today.  The patient presents as pleasant and cooperative.  Today we identified things from the list that we need to continue to target with the EMDR.  The next 1 on the list that we need to focus on is a situation that happened when she lived in [place] and she does not have a memory of what exactly happened but knew that something was not right.  We identified the negative cognition of I am unsafe.  And the positive cognition that we will use is I can choose who I trust.  We will try to do that the next time that I see her in therapy and we also identified the rest of the ones we need to clear out moving forward and we can see how many of these are going to be cleared out with the ones that were doing now.  Interventions: Cognitive Behavioral Therapy, Mindfulness Meditation, Eye Movement Desensitization and Reprocessing (EMDR),  Insight-Oriented, and Interpersonal, psychoeducation  Diagnosis:PTSD (post-traumatic stress disorder)  Attention deficit hyperactivity disorder (ADHD), combined type  Mixed obsessional thoughts and acts  Plan: Plan of Care: Client Abilities/Strengths  Insightful, motivated, Supportive family Client Treatment Preferences  Outpatient Individual therapy/EMDR  Client Statement of Needs  I think I have PTSD and I feel like I need another kind of therapy than talk therapy Treatment Level  Outpatient Individual therapy  Symptoms  Demonstrates an exaggerated startle response.:  (Status: maintained). Depressed  or irritable mood.: (Status:improved). Describes a reliving of the event,  particularly through dissociative flashbacks.: (Status: maintained). Displays a  significant decline in interest and engagement in activities.:  (Status: improved). Displays significant psychological and/or physiological distress resulting from internal and external  clues that are reminiscent of the traumatic event.: (Status: maintained).  Experiences disturbances in sleep.:  (Status: improved). Experiences disturbing  and persistent thoughts, images, and/or perceptions of the traumatic event.:  (Status: maintained). Experiences frequent nightmares.: (Status: maintained).  Feelings of hopelessness, worthlessness, or inappropriate guilt.: No Description Entered (Status:  improved). Has been exposed to a traumatic event involving actual or perceived threat of death or  serious injury.:  (Status: maintained). Impairment in social, occupational, or  other areas of functioning.:(Status: maintained). Intentionally avoids activities,  places, people, or objects (e.g., up-armored vehicles) that evoke memories of the event.: (Status: maintained). Intentionally avoids thoughts, feelings, or discussions related  to the traumatic event.: (Status: maintained). Reports difficulty concentrating as  well as feelings of guilt  (Status:maintained). Reports response of intense  fear,  helplessness, or horror to the traumatic event.:  (Status: maintained).  Problems Addressed  Unipolar Depression, Posttraumatic Stress Disorder (PTSD), Posttraumatic Stress Disorder (PTSD),   Posttraumatic Stress Disorder (PTSD), Posttraumatic Stress Disorder (PTSD)  Goals 1. Develop healthy thinking patterns and beliefs about self, others, and the world that lead to the alleviation and help prevent the relapse of  depression. Objective Identify and replace thoughts and beliefs that support depression. Target Date: 2025/01/03 Frequency: Weekly Progress: 70 Modality: individual Related Interventions 1. Explore and restructure underlying assumptions and beliefs reflected in biased self-talk that  may put the client at risk for relapse or recurrence. 2. Conduct Cognitive-Behavioral Therapy (see Cognitive Behavior Therapy by Almarie; Overcoming Depression by Marine dunker al.), beginning with helping the client learn the connection among  cognition, depressive feelings, and actions. 2. Eliminate or reduce the negative impact trauma related symptoms have  on social, occupational, and family functioning. Objective Learn and implement personal skills to manage challenging situations related to trauma. Target Date: 2025/01/03 Frequency: Weekly Progress: 30 Modality: individual 3. No longer avoids persons, places, activities, and objects that are  reminiscent of the traumatic event. Objective Participate in Eye Movement Desensitization and Reprocessing (EMDR) to reduce emotional distress  related to traumatic thoughts, feelings, and images. Target Date: 2025/01/03 Frequency: Weekly Progress: 20 Modality: individual   Related Interventions 1. Utilize Eye Movement Desensitization and Reprocessing (EMDR) to reduce the client's  emotional reactivity to the traumatic event and reduce PTSD symptoms. Objective Learn and implement guided  self-dialogue to manage thoughts, feelings, and urges brought on by  encounters with trauma-related situations. Target Date: 2026-11/07 Frequency: Weekly Progress: 30 Modality: individual Related Interventions 1. Teach the client a guided self-dialogue procedure in which he/she learns to recognize  maladaptive self-talk, challenges its biases, copes with engendered feelings, overcomes  avoidance, and reinforces his/her accomplishments; review and reinforce progress, problemsolve obstacles. 4. No longer experiences intrusive event recollections, avoidance of event  reminders, intense arousal, or disinterest in activities or  relationships. 5. Thinks about or openly discusses the traumatic event with others  without experiencing psychological or physiological distress. Diagnosis Axis  none F43.10 (Posttraumatic stress disorder) - Open - [Signifier: n/a] Posttraumatic Stress  Disorder  Conditions For Discharge Achievement of treatment goals and objectives   Anush Wiedeman G Hether Anselmo, LCSW

## 2024-01-02 ENCOUNTER — Ambulatory Visit: Attending: Family Medicine

## 2024-01-02 DIAGNOSIS — M353 Polymyalgia rheumatica: Secondary | ICD-10-CM | POA: Insufficient documentation

## 2024-01-02 DIAGNOSIS — M6281 Muscle weakness (generalized): Secondary | ICD-10-CM | POA: Diagnosis present

## 2024-01-02 DIAGNOSIS — M79672 Pain in left foot: Secondary | ICD-10-CM | POA: Insufficient documentation

## 2024-01-02 DIAGNOSIS — R252 Cramp and spasm: Secondary | ICD-10-CM | POA: Insufficient documentation

## 2024-01-02 DIAGNOSIS — M542 Cervicalgia: Secondary | ICD-10-CM | POA: Insufficient documentation

## 2024-01-02 DIAGNOSIS — M79671 Pain in right foot: Secondary | ICD-10-CM | POA: Diagnosis present

## 2024-01-02 DIAGNOSIS — R293 Abnormal posture: Secondary | ICD-10-CM | POA: Insufficient documentation

## 2024-01-02 NOTE — Therapy (Signed)
 OUTPATIENT PHYSICAL THERAPY TREATMENT     Patient Name: Danielle Bruce MRN: [id number] DOB:Mar 04, 1960, 63 y.o., female Today's Date: 01/02/2024       PT End of Session - 01/02/24 1143     Visit Number 16    Date for Recertification  02/04/24    Authorization Type Medicare-KX needed    Progress Note Due on Visit 19    PT Start Time 1101    PT Stop Time 1145    PT Time Calculation (min) 44 min    Behavior During Therapy [facility] for tasks assessed/performed                              Past Medical History:  Diagnosis Date   Abdominal pain, unspecified site 10/18/2012   Anemia    Anxiety    Arthritis    osteoarthritis   ASCUS (atypical squamous cells of undetermined significance) on Pap smear 05/06/2005   NEG HIGH RISK HPV--C&B BIOPSY BENIGN 12/2005   Asthma    Bipolar 2 disorder (HCC)    Cancer (HCC)    skin cancer - basal cell   Colon polyps    Complication of anesthesia    anxious afterwards, will get headaches    Constipation    Depression    Fibromyalgia 10/2013   GERD (gastroesophageal reflux disease)    Hearing loss on left    Heart murmur    never had any problems   Hemorrhoids    High cholesterol    High risk HPV infection 08/2011   cytology negative   IBS (irritable bowel syndrome)    Insomnia    Lymphocytic colitis    Lymphoma (HCC)    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 Cornett, MD;  Location: [facility];   Service: General;  Laterality: N/A;   COLONOSCOPY     numerous times   DILATION AND CURETTAGE OF UTERUS     ESOPHAGOGASTRODUODENOSCOPY     HEMORRHOID SURGERY  1993   x3   IUD REMOVAL  02/2015   Mirena   LAPAROSCOPIC LYSIS OF ADHESIONS N/A 10/16/2012   Procedure: LAPAROSCOPIC LYSIS OF ADHESIONS;  Surgeon: Debby A. Cornett, MD;  Location: [facility];  Service: General;  Laterality: N/A;   LAPAROSCOPY N/A 10/16/2012   Procedure: LAPAROSCOPY DIAGNOSTIC;  Surgeon: Debby LABOR. Cornett, MD;  Location: [facility];  Service: General;  Laterality: N/A;   PELVIC LAPAROSCOPY     RADIOLOGY WITH ANESTHESIA N/A 12/16/2015   Procedure: MRI OF BRAIN WITH AND WITHOUT CONTRAST;  Surgeon: Medication Radiologist, MD;  Location: [facility];  Service: Radiology;  Laterality: N/A;   SHOULDER SURGERY  2007/2008   SPINE SURGERY  2010   cervical   Patient Active Problem List   Diagnosis Date Noted   Sensorineural hearing loss, bilateral 11/19/2023   GAD (generalized anxiety disorder) 12/26/2017   OCD (obsessive compulsive disorder) 12/26/2017  PTSD (post-traumatic stress disorder) 12/26/2017   DDD (degenerative disc disease), cervical 07/14/2016   Primary osteoarthritis of both feet 07/14/2016   Primary osteoarthritis of both hands 07/14/2016   Other fatigue 07/14/2016   History of IBS 07/14/2016   Osteopenia of multiple sites 07/14/2016   Fibromyalgia 01/13/2016   MGUS (monoclonal gammopathy of unknown significance) 12/23/2013   Chronic cholecystitis without calculus 10/18/2012   Abdominal pain 10/18/2012   Nausea alone 10/18/2012   Constipation 10/18/2012   Depression 10/18/2012   Anxiety    ASCUS (atypical squamous cells of undetermined significance) on Pap smear    IUD    Hemorrhoids 11/29/2010   Abdominal pain, left upper quadrant 11/29/2010    PCP:  Joen Gentry, MD  REFERRING PROVIDER: Joen Gentry, MD  REFERRING DIAG: fibromyalgia  THERAPY DIAG:  Abnormal posture  Cramp and  spasm  Polymyalgia rheumatica  Muscle weakness (generalized)  Rationale for Evaluation and Treatment Rehabilitation  ONSET DATE: chronic pain (fibromyalgia), bowel flare up 1.5 months ago  SUBJECTIVE:                                                                                                                                                                                                         SUBJECTIVE STATEMENT:  I was with my grandkids recently so I'm tired.  I have been consistent with my exercises when not traveling.     PERTINENT HISTORY:  Anxiety, bipolar disorder, depression, fibromyalgia, IBS, migraines, osteopenia, PTSD  PAIN: 01/02/24 Are you having pain: Lt hip is sore 4/10 Pain location:   Pain rating:  Pain description: irritating, annoying   Aggravating factors: doing too much, stress  Relieving factors: muscle relaxers, rest, stretching  PRECAUTIONS: Other: chronic pain syndrome and depression Other: slow progression with exercise due to chronic condition  WEIGHT BEARING RESTRICTIONS No  FALLS:  Has patient fallen in last 6 months? No  LIVING ENVIRONMENT: Lives with: lives with their family Lives in: House/apartment  OCCUPATION: on disability  PLOF: Independent with basic ADLs and Leisure: yardwork, walking  Pt cares for her 4 young grandchildren- difficulty with care including lifting and carrying  PATIENT GOALS be more active with less pain, exercise at the gym regularly, lift and carry grandchildren and laundry, improved ease with yardwork and housework, resume exercise  OBJECTIVE:   DIAGNOSTIC FINDINGS: none recent   PATIENT SURVEYS:  The Patient-Specific Functional Scale  Initial:  I am going to ask you to identify up to 3 important activities that you are unable to do or are having difficulty with as a  result of this problem.  Today are there any activities that you are unable to do or having difficulty with because of this?  (Patient  shown scale and patient rated each activity)  Follow up: When you first came in you had difficulty performing these activities.  Today do you still have difficulty?  Patient-Specific activity scoring scheme (Point to one number):  0 1 2 3 4 5 6 7 8 9  10 Unable                                                                                                          Able to perform To perform                                                                                                    activity at the same Activity         Level as before                                                                                                                       Injury or problem  Activity   Lifting laundry and groceries/housework                           initial:  3/10  11/14/23: 5/10  2.    Yard work- up and down with squatting          Initial: 2/10  11/14/23: 3/10     COGNITION: Overall cognitive status: Within functional limits for tasks assessed  SENSATION: WFL  POSTURE: rounded shoulders and forward head  PALPATION: Diffuse palpable tenderness over bil neck, upper traps, thoracic, lumbar and gluteals with trigger points.    CERVICAL ROM:   Stiff at end range and pain on Rt with Lt motions.  All WFLs  UPPER EXTREMITY ROM: UE A/ROM is limited by 20% into flexion and abduction with pain at end range.  Hip flexibility is limited by 25% in all directions with pain in all directions.  UPPER EXTREMITY MMT: UE: 4+/5, LE 4+/5 except hip flexors 4-/5  TODAY'S TREATMENT:  01/02/24:  Nustep level 4 for 10 min- PT monitored and discussed pt status Biceps curls to overhead press: 5# 2x10 -seated on ball  Ball roll outs 3 ways x10 each  Seated hamstring stretch 3x20 seconds               Seated piriformis stretch 2x30 seconds  Leg press: seat 5, 60# 2x10 bil, 40# single leg 2x10 Row: 25# 2x10 Step-up on bosu: forward and lateral x10 bil each  Sit to stand: 5# parallel and staggered stance x10 each-with high pull Sidestepping with yellow loop around thighs x3 laps along mat table    12/26/23:Pt arrives for aquatic physical therapy. Treatment took place in 3.5-5.5 feet of water. Water temperature was 91 degres F. Pt entered the pool via stairs independently.  -50% depth water walking with UE push pull with yellow floats 10x each direction. VC to walk as fast as she could to create a good resistive current.Ankle fins used. - High knee march 6x holding yellow floats by her side. Vc to keep heart lifted.  -Bil hip lifts 3 directions with ankle fins 20x each -Pink bells core work: static stance arms forward and back 2x20 VC to increase speed -Standing against wall core/lat press with 2 rainbow floats 3x10 -Horseback bicycle on yellow 7 min, ankle fins  12/19/23:  Nustep level 3 for 10 min- PT monitored and discussed pt status Biceps curls: 6# 2x10 -seated on ball  Ball roll outs 3 ways x10 each  Seated hamstring stretch 3x20 seconds  Seated on ball: 3# 3 way raises 2x10 Sit to stand: 5# parallel and staggered stance x10 each-with high pull Manual: elongation and release to neck, suboccipitals, passive upper trap stretch, pec stretch and mobs with movement-lateral and PA    HOME EXERCISE PROGRAM:  Access Code: [email or web address]- previously provided  Access Code: [email or web address] URL: [email or web address] Date: 09/19/2023 Prepared by: Burnard  Exercises - Seated Cervical Flexion AROM  - 3 x daily - 7 x weekly - 1 sets - 3 reps - 20 hold - Seated Cervical Sidebending AROM  - 3 x daily - 7 x weekly - 1 sets - 3 reps - 20 hold - Seated Cervical Rotation AROM  - 3 x daily - 7 x weekly - 1 sets - 3 reps - 20 hold - Seated Correct Posture  - 1 x daily - 7 x weekly - 3 sets - 10 reps - Seated Diaphragmatic Breathing  - 5 x daily - 7 x weekly - 1 sets - 10 reps - Sidelying Open Book Thoracic Rotation with Knee on Foam Roll  - 2 x daily - 7 x weekly - 3 sets - 10 reps - Seated Transversus Abdominis Bracing  - 5 x daily - 7 x weekly - 1 sets - 10 reps - 5 hold - Seated Hamstring Stretch  - 3 x daily - 7 x weekly - 1 sets - 3  reps - 20 hold ASSESSMENT:  CLINICAL IMPRESSION: Pt is doing very well and is remaining active with her daily exercise.  She spent time caring for her 4 grandchildren and was fatigued but overall did well with this. We advanced to weight machines in the clinic today.  She tolerated a full exercise session without increased pain or significant fatigue.  PT monitored throughout session for technique, safety and pain. Patient will benefit from skilled PT to address the below impairments and improve overall function.    OBJECTIVE IMPAIRMENTS decreased activity tolerance, decreased endurance, difficulty walking, decreased strength, increased muscle spasms, impaired flexibility, improper body mechanics, postural dysfunction, and pain.   ACTIVITY LIMITATIONS carrying, lifting, sitting, standing, reach over head, hygiene/grooming, and locomotion level  PARTICIPATION LIMITATIONS: meal prep, cleaning, laundry, driving, community activity, and yard work  PERSONAL FACTORS Past/current experiences, Time since onset of injury/illness/exacerbation, and 3+ comorbidities: fibromyalgia depression, anxiety,  are also affecting patient's functional outcome.   REHAB POTENTIAL: Good  CLINICAL DECISION MAKING: Evolving/moderate complexity  EVALUATION COMPLEXITY: Moderate   GOALS: Goals reviewed with patient? Yes  SHORT TERM GOALS: Target date: 10/17/2023    Return to regular performance of HEP 4-5x/week to improve mobility  Baseline: 3-5x/wk (met) Goal status: MET  2.  Rate housework > or = to 4/10 on patient specific functional scale (PSFS) Baseline: 5/10 Goal status: MET  3.  Verbalize and demonstrate abdominal massage daily to reduce GI pain Baseline:  Goal status: Met 8/11    LONG TERM GOALS: Target date: 02/04/24    Be independent in advanced HEP Baseline:  Goal status:  Ongoing  2.  Rate squatting for yard work > or = to 4/10 on PSFS Baseline: 3/10 (11/14/23) Goal status:NEW  3.   Report > or = to 70% reduction in neck and thoracic pain with daily tasks  Baseline: 55% (01/02/24) Goal status: in progress   4.  Walk for exercise and perform regular exercises 3-5x/wk for exercise to improve endurance  Baseline: some weeks, more consistent now (11/14/23) Goal status: in progress   5.  Rate housework > or =  to 6/10 on PSFS  Baseline: 5/10  (11/14/23) Goal status: revised    PLAN: PT FREQUENCY: 1-2x/week  PT DURATION: 12 weeks  PLANNED INTERVENTIONS: 97164- PT Re-evaluation, 97110-Therapeutic exercises, 97530- Therapeutic activity, 97112- Neuromuscular re-education, 97535- Self Care, [id number]- Manual therapy, [phone]- Gait training, [phone]- Aquatic Therapy, [phone]- Electrical stimulation (unattended), [phone]- Electrical stimulation (manual), [id number]- Traction (mechanical), 20560 (1-2 muscles), 20561 (3+ muscles)- Dry Needling, Patient/Family education, Balance training, Taping, Joint mobilization, Spinal manipulation, Spinal mobilization, Cryotherapy, Moist heat, Therapeutic exercises, Therapeutic activity, Neuromuscular re-education, and Self Care  PLAN FOR NEXT SESSION:: continued advancement of mobility, strength and manual to address tension  Burnard Joy, PT 01/02/24 11:44 AM

## 2024-01-03 ENCOUNTER — Ambulatory Visit (INDEPENDENT_AMBULATORY_CARE_PROVIDER_SITE_OTHER)

## 2024-01-03 DIAGNOSIS — J309 Allergic rhinitis, unspecified: Secondary | ICD-10-CM | POA: Diagnosis not present

## 2024-01-03 NOTE — Progress Notes (Signed)
 Immunotherapy   Patient Details  Name: Danielle Bruce MRN: [id number] Date of Birth: 14-Jan-1961  01/03/2024  Danielle Bruce started injections for  Dog Following schedule: B  Frequency: twice weekly Epi-Pen:Epi-Pen Available  Consent signed and patient instructions given. Patient waited in office 30 minutes with no issues   Danielle Bruce Shed 01/03/2024, 3:07 PM

## 2024-01-07 ENCOUNTER — Other Ambulatory Visit: Payer: Self-pay | Admitting: Cardiology

## 2024-01-07 DIAGNOSIS — E785 Hyperlipidemia, unspecified: Secondary | ICD-10-CM

## 2024-01-07 DIAGNOSIS — Z23 Encounter for immunization: Secondary | ICD-10-CM | POA: Diagnosis not present

## 2024-01-07 DIAGNOSIS — F900 Attention-deficit hyperactivity disorder, predominantly inattentive type: Secondary | ICD-10-CM | POA: Diagnosis not present

## 2024-01-07 DIAGNOSIS — J45909 Unspecified asthma, uncomplicated: Secondary | ICD-10-CM | POA: Diagnosis not present

## 2024-01-07 DIAGNOSIS — J309 Allergic rhinitis, unspecified: Secondary | ICD-10-CM | POA: Diagnosis not present

## 2024-01-07 DIAGNOSIS — M810 Age-related osteoporosis without current pathological fracture: Secondary | ICD-10-CM | POA: Diagnosis not present

## 2024-01-07 DIAGNOSIS — K760 Fatty (change of) liver, not elsewhere classified: Secondary | ICD-10-CM | POA: Diagnosis not present

## 2024-01-07 DIAGNOSIS — R079 Chest pain, unspecified: Secondary | ICD-10-CM

## 2024-01-07 DIAGNOSIS — F411 Generalized anxiety disorder: Secondary | ICD-10-CM | POA: Diagnosis not present

## 2024-01-08 ENCOUNTER — Ambulatory Visit (INDEPENDENT_AMBULATORY_CARE_PROVIDER_SITE_OTHER): Admitting: Psychology

## 2024-01-08 DIAGNOSIS — F902 Attention-deficit hyperactivity disorder, combined type: Secondary | ICD-10-CM

## 2024-01-08 DIAGNOSIS — F422 Mixed obsessional thoughts and acts: Secondary | ICD-10-CM

## 2024-01-08 DIAGNOSIS — F4310 Post-traumatic stress disorder, unspecified: Secondary | ICD-10-CM | POA: Diagnosis not present

## 2024-01-09 ENCOUNTER — Encounter: Payer: Self-pay | Admitting: Physical Therapy

## 2024-01-09 ENCOUNTER — Ambulatory Visit: Admitting: Physical Therapy

## 2024-01-09 DIAGNOSIS — M542 Cervicalgia: Secondary | ICD-10-CM

## 2024-01-09 DIAGNOSIS — R252 Cramp and spasm: Secondary | ICD-10-CM

## 2024-01-09 DIAGNOSIS — M79671 Pain in right foot: Secondary | ICD-10-CM | POA: Diagnosis not present

## 2024-01-09 DIAGNOSIS — M79672 Pain in left foot: Secondary | ICD-10-CM

## 2024-01-09 DIAGNOSIS — M353 Polymyalgia rheumatica: Secondary | ICD-10-CM

## 2024-01-09 DIAGNOSIS — R293 Abnormal posture: Secondary | ICD-10-CM

## 2024-01-09 DIAGNOSIS — M6281 Muscle weakness (generalized): Secondary | ICD-10-CM

## 2024-01-09 NOTE — Progress Notes (Signed)
 [facility] Counselor/Therapist Progress Note  Patient ID: Danielle Bruce, MRN: [id number],    Date:01/01/2024  Time Spent: 60 minutes Time In: 3:03  Time out:4:03  Treatment Type: Individual Therapy  Reported Symptoms: flashbacks, responding to triggers, depression, anxiety, being busy all of the time, dissociation  Mental Status Exam: Appearance:  Casual     Behavior: Appropriate  Motor: normal  Speech/Language:  Clear and Coherent  Affect: Blunt  Mood: pleasant  Thought process: normal  Thought content:   WNL  Sensory/Perceptual disturbances:   WNL  Orientation: oriented to person, place, time/date, and situation  Attention: Good  Concentration: Good  Memory: WNL  Fund of knowledge:  Good  Insight:   Good  Judgment:  Fair  Impulse Control: Good   Risk Assessment: Danger to Self:  No Self-injurious Behavior: No Danger to Others: No Duty to Warn:no Physical Aggression / Violence:No  Access to Firearms a concern: No  Gang Involvement:No   Subjective: The patient attended a face-to-face individual therapy in the office today.  The patient presents as pleasant and cooperative.  The patient was a little anxious today about doing the EMDR from the situation and sent for because she does not have a real memory of what actually happened.  We went ahead and did the EMDR and her suds score was a 10 in the beginning and by the end it was a 0.  The patient was amazed at how much better she felt about that situation and she was able to get it to a place where she realizes that it was in her past and that she does not have to miss the steroid go back and react from that.  We talked about how EMDR will build up on itself and we do have some more that we will target moving forward. Interventions: Cognitive Behavioral Therapy, Mindfulness Meditation, Eye Movement Desensitization and Reprocessing (EMDR), Insight-Oriented, and Interpersonal, psychoeducation  Diagnosis:PTSD  (post-traumatic stress disorder)  Attention deficit hyperactivity disorder (ADHD), combined type  Mixed obsessional thoughts and acts  Plan: Plan of Care: Client Abilities/Strengths  Insightful, motivated, Supportive family Client Treatment Preferences  Outpatient Individual therapy/EMDR  Client Statement of Needs  I think I have PTSD and I feel like I need another kind of therapy than talk therapy Treatment Level  Outpatient Individual therapy  Symptoms  Demonstrates an exaggerated startle response.:  (Status: maintained). Depressed  or irritable mood.: (Status:improved). Describes a reliving of the event,  particularly through dissociative flashbacks.: (Status: maintained). Displays a  significant decline in interest and engagement in activities.:  (Status: improved). Displays significant psychological and/or physiological distress resulting from internal and external  clues that are reminiscent of the traumatic event.: (Status: maintained).  Experiences disturbances in sleep.:  (Status: improved). Experiences disturbing  and persistent thoughts, images, and/or perceptions of the traumatic event.:  (Status: maintained). Experiences frequent nightmares.: (Status: maintained).  Feelings of hopelessness, worthlessness, or inappropriate guilt.: No Description Entered (Status:  improved). Has been exposed to a traumatic event involving actual or perceived threat of death or  serious injury.:  (Status: maintained). Impairment in social, occupational, or  other areas of functioning.:(Status: maintained). Intentionally avoids activities,  places, people, or objects (e.g., up-armored vehicles) that evoke memories of the event.: (Status: maintained). Intentionally avoids thoughts, feelings, or discussions related  to the traumatic event.: (Status: maintained). Reports difficulty concentrating as  well as feelings of guilt (Status:maintained). Reports response of intense fear,  helplessness,  or horror to the traumatic event.:  (Status:  maintained).  Problems Addressed  Unipolar Depression, Posttraumatic Stress Disorder (PTSD), Posttraumatic Stress Disorder (PTSD),   Posttraumatic Stress Disorder (PTSD), Posttraumatic Stress Disorder (PTSD)  Goals 1. Develop healthy thinking patterns and beliefs about self, others, and the world that lead to the alleviation and help prevent the relapse of  depression. Objective Identify and replace thoughts and beliefs that support depression. Target Date: 2025/01/03 Frequency: Weekly Progress: 70 Modality: individual Related Interventions 1. Explore and restructure underlying assumptions and beliefs reflected in biased self-talk that  may put the client at risk for relapse or recurrence. 2. Conduct Cognitive-Behavioral Therapy (see Cognitive Behavior Therapy by Almarie; Overcoming Depression by Marine dunker al.), beginning with helping the client learn the connection among  cognition, depressive feelings, and actions. 2. Eliminate or reduce the negative impact trauma related symptoms have  on social, occupational, and family functioning. Objective Learn and implement personal skills to manage challenging situations related to trauma. Target Date: 2025/01/03 Frequency: Weekly Progress: 40 Modality: individual 3. No longer avoids persons, places, activities, and objects that are  reminiscent of the traumatic event. Objective Participate in Eye Movement Desensitization and Reprocessing (EMDR) to reduce emotional distress  related to traumatic thoughts, feelings, and images. Target Date: 2025/01/03 Frequency: Weekly Progress: 30 Modality: individual   Related Interventions 1. Utilize Eye Movement Desensitization and Reprocessing (EMDR) to reduce the client's  emotional reactivity to the traumatic event and reduce PTSD symptoms. Objective Learn and implement guided self-dialogue to manage thoughts, feelings, and urges brought on by   encounters with trauma-related situations. Target Date: 2026-11/07 Frequency: Weekly Progress: 40 Modality: individual Related Interventions 1. Teach the client a guided self-dialogue procedure in which he/she learns to recognize  maladaptive self-talk, challenges its biases, copes with engendered feelings, overcomes  avoidance, and reinforces his/her accomplishments; review and reinforce progress, problemsolve obstacles. 4. No longer experiences intrusive event recollections, avoidance of event  reminders, intense arousal, or disinterest in activities or  relationships. 5. Thinks about or openly discusses the traumatic event with others  without experiencing psychological or physiological distress. Diagnosis Axis  none F43.10 (Posttraumatic stress disorder) - Open - [Signifier: n/a] Posttraumatic Stress  Disorder  Conditions For Discharge Achievement of treatment goals and objectives   Jules Vidovich G Danielle Celia, LCSW

## 2024-01-09 NOTE — Therapy (Signed)
 OUTPATIENT PHYSICAL THERAPY TREATMENT     Patient Name: Danielle Bruce MRN: [id number] DOB:1961-01-14, 63 y.o., female Today's Date: 01/09/2024       PT End of Session - 01/09/24 1201     Visit Number 17    Date for Recertification  02/04/24    Authorization Type Medicare-KX needed    Progress Note Due on Visit 19    PT Start Time 1200   15 min late   PT Stop Time 1238    PT Time Calculation (min) 38 min    Activity Tolerance Patient tolerated treatment well    Behavior During Therapy [facility] for tasks assessed/performed                               Past Medical History:  Diagnosis Date   Abdominal pain, unspecified site 10/18/2012   Anemia    Anxiety    Arthritis    osteoarthritis   ASCUS (atypical squamous cells of undetermined significance) on Pap smear 05/06/2005   NEG HIGH RISK HPV--C&B BIOPSY BENIGN 12/2005   Asthma    Bipolar 2 disorder (HCC)    Cancer (HCC)    skin cancer - basal cell   Colon polyps    Complication of anesthesia    anxious afterwards, will get headaches    Constipation    Depression    Fibromyalgia 10/2013   GERD (gastroesophageal reflux disease)    Hearing loss on left    Heart murmur    never had any problems   Hemorrhoids    High cholesterol    High risk HPV infection 08/2011   cytology negative   IBS (irritable bowel syndrome)    Insomnia    Lymphocytic colitis    Lymphoma (HCC)    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   Bone marrow biopsy showes 8% plasma cells IgA Lambda   Migraines    Nausea alone 10/18/2012   Osteoarthritis    Osteopenia    Peripheral neuropathy    PTSD (post-traumatic stress disorder)    Past Surgical History:  Procedure Laterality Date   BONE MARROW BIOPSY Left 12/18/2013   Plasma cell dyscrasia 8% population of plasma cells   BREAST BIOPSY Right    benign stereo   CESAREAN SECTION  13,11,06   CHOLECYSTECTOMY N/A 10/16/2012   Procedure: LAPAROSCOPIC  CHOLECYSTECTOMY;  Surgeon: Debby LABOR. Cornett, MD;  Location: [facility];  Service: General;  Laterality: N/A;   COLONOSCOPY     numerous times   DILATION AND CURETTAGE OF UTERUS     ESOPHAGOGASTRODUODENOSCOPY     HEMORRHOID SURGERY  1993   x3   IUD REMOVAL  02/2015   Mirena   LAPAROSCOPIC LYSIS OF ADHESIONS N/A 10/16/2012   Procedure: LAPAROSCOPIC LYSIS OF ADHESIONS;  Surgeon: Debby A. Cornett, MD;  Location: [facility];  Service: General;  Laterality: N/A;   LAPAROSCOPY N/A 10/16/2012   Procedure: LAPAROSCOPY DIAGNOSTIC;  Surgeon: Debby LABOR. Cornett, MD;  Location: [facility];  Service: General;  Laterality: N/A;   PELVIC LAPAROSCOPY     RADIOLOGY WITH ANESTHESIA N/A 12/16/2015   Procedure: MRI OF BRAIN WITH AND WITHOUT CONTRAST;  Surgeon: Medication Radiologist, MD;  Location: [facility];  Service: Radiology;  Laterality: N/A;   SHOULDER SURGERY  2007/2008   SPINE SURGERY  2010   cervical   Patient Active Problem List   Diagnosis Date Noted   Sensorineural hearing loss, bilateral 11/19/2023  GAD (generalized anxiety disorder) 12/26/2017   OCD (obsessive compulsive disorder) 12/26/2017   PTSD (post-traumatic stress disorder) 12/26/2017   DDD (degenerative disc disease), cervical 07/14/2016   Primary osteoarthritis of both feet 07/14/2016   Primary osteoarthritis of both hands 07/14/2016   Other fatigue 07/14/2016   History of IBS 07/14/2016   Osteopenia of multiple sites 07/14/2016   Fibromyalgia 01/13/2016   MGUS (monoclonal gammopathy of unknown significance) 12/23/2013   Chronic cholecystitis without calculus 10/18/2012   Abdominal pain 10/18/2012   Nausea alone 10/18/2012   Constipation 10/18/2012   Depression 10/18/2012   Anxiety    ASCUS (atypical squamous cells of undetermined significance) on Pap smear    IUD    Hemorrhoids 11/29/2010   Abdominal pain, left upper quadrant 11/29/2010    PCP:  Joen Gentry, MD  REFERRING PROVIDER: Joen Gentry, MD  REFERRING DIAG:  fibromyalgia  THERAPY DIAG:  Abnormal posture  Cramp and spasm  Polymyalgia rheumatica  Muscle weakness (generalized)  Pain in left foot  Pain in right foot  Cervicalgia  Rationale for Evaluation and Treatment Rehabilitation  ONSET DATE: chronic pain (fibromyalgia), bowel flare up 1.5 months ago  SUBJECTIVE:                                                                                                                                                                                                         SUBJECTIVE STATEMENT:  I was worn out after last aquatic but I liked feeling that way knowing I had a good workout.   PERTINENT HISTORY:  Anxiety, bipolar disorder, depression, fibromyalgia, IBS, migraines, osteopenia, PTSD  PAIN: 01/02/24 Are you having pain: Lt hip is sore 4/10 Pain location:   Pain rating:  Pain description: irritating, annoying   Aggravating factors: doing too much, stress  Relieving factors: muscle relaxers, rest, stretching  PRECAUTIONS: Other: chronic pain syndrome and depression Other: slow progression with exercise due to chronic condition  WEIGHT BEARING RESTRICTIONS No  FALLS:  Has patient fallen in last 6 months? No  LIVING ENVIRONMENT: Lives with: lives with their family Lives in: House/apartment  OCCUPATION: on disability  PLOF: Independent with basic ADLs and Leisure: yardwork, walking  Pt cares for her 4 young grandchildren- difficulty with care including lifting and carrying  PATIENT GOALS be more active with less pain, exercise at the gym regularly, lift and carry grandchildren and laundry, improved ease with yardwork and housework, resume exercise  OBJECTIVE:   DIAGNOSTIC FINDINGS: none recent   PATIENT SURVEYS:  The Patient-Specific Functional Scale  Initial:  I am going  to ask you to identify up to 3 important activities that you are unable to do or are having difficulty with as a result of this problem.  Today are  there any activities that you are unable to do or having difficulty with because of this?  (Patient shown scale and patient rated each activity)  Follow up: When you first came in you had difficulty performing these activities.  Today do you still have difficulty?  Patient-Specific activity scoring scheme (Point to one number):  0 1 2 3 4 5 6 7 8 9  10 Unable                                                                                                          Able to perform To perform                                                                                                    activity at the same Activity         Level as before                                                                                                                       Injury or problem  Activity   Lifting laundry and groceries/housework                           initial:  3/10  11/14/23: 5/10  2.    Yard work- up and down with squatting          Initial: 2/10  11/14/23: 3/10     COGNITION: Overall cognitive status: Within functional limits for tasks assessed  SENSATION: WFL  POSTURE: rounded shoulders and forward head  PALPATION: Diffuse palpable tenderness over bil neck, upper traps, thoracic, lumbar and gluteals with trigger points.    CERVICAL ROM:   Stiff at end range and pain on Rt with Lt motions.  All WFLs  UPPER EXTREMITY ROM: UE A/ROM is limited by 20% into flexion and abduction with pain at end range.  Hip flexibility is limited by 25% in  all directions with pain in all directions.  UPPER EXTREMITY MMT: UE: 4+/5, LE 4+/5 except hip flexors 4-/5  TODAY'S TREATMENT:   01/09/24: Pt arrives for aquatic physical therapy. Treatment took place in 3.5-5.5 feet of water. Water temperature was 91 degres F. Pt entered the pool via stairs independently.  -50% depth water walking with UE push pull with yellow floats 10x each direction. VC to walk as fast as she could to create a good  resistive current.Ankle fins used. - High knee march 6x holding yellow floats by her side. Vc to keep heart lifted.  -Bil hip lifts 3 directions with ankle fins 20x each -Pink bells core work: static stance arms forward and back 2x20 VC to increase speed -Standing against wall core/lat press with 2 rainbow floats 3x10 -Horseback bicycle on yellow 7 min, ankle fins  01/02/24:  Nustep level 4 for 10 min- PT monitored and discussed pt status Biceps curls to overhead press: 5# 2x10 -seated on ball  Ball roll outs 3 ways x10 each  Seated hamstring stretch 3x20 seconds               Seated piriformis stretch 2x30 seconds  Leg press: seat 5, 60# 2x10 bil, 40# single leg 2x10 Row: 25# 2x10 Step-up on bosu: forward and lateral x10 bil each  Sit to stand: 5# parallel and staggered stance x10 each-with high pull Sidestepping with yellow loop around thighs x3 laps along mat table    12/26/23:Pt arrives for aquatic physical therapy. Treatment took place in 3.5-5.5 feet of water. Water temperature was 91 degres F. Pt entered the pool via  stairs independently.  -50% depth water walking with UE push pull with yellow floats 10x each direction. VC to walk as fast as she could to create a good resistive current.Ankle fins used. - High knee march 6x holding yellow floats by her side. Vc to keep heart lifted.  -Bil hip lifts 3 directions with ankle fins 20x each -Pink bells core work: static stance arms forward and back 2x20 VC to increase speed -Standing against wall core/lat press with 2 rainbow floats 3x10 -Horseback bicycle on yellow 7 min, ankle fins   HOME EXERCISE PROGRAM:  Access Code: [email or web address]- previously provided  Access Code: [email or web address] URL: [email or web address] Date: 09/19/2023 Prepared by: Burnard  Exercises - Seated Cervical Flexion AROM  - 3 x daily - 7 x weekly - 1 sets - 3 reps - 20 hold - Seated Cervical Sidebending AROM  - 3 x daily - 7 x weekly - 1 sets - 3 reps - 20 hold - Seated Cervical Rotation AROM  - 3 x daily - 7 x weekly - 1 sets - 3 reps - 20 hold - Seated Correct Posture  - 1 x daily - 7 x weekly - 3 sets - 10 reps - Seated Diaphragmatic Breathing  - 5 x daily - 7 x weekly - 1 sets - 10 reps - Sidelying Open Book Thoracic Rotation with Knee on Foam Roll  - 2 x daily - 7 x weekly - 3 sets - 10 reps - Seated Transversus Abdominis Bracing  - 5 x daily - 7 x weekly - 1 sets - 10 reps - 5 hold - Seated Hamstring Stretch  - 3 x daily - 7 x weekly - 1 sets - 3 reps - 20 hold ASSESSMENT:  CLINICAL IMPRESSION: Good level of intensity for pt with this current routine. Pt feels challenged without feeling completley exhausted after. Pt literally took 1 small rest break today demonstrating how much better her endurance is.     OBJECTIVE IMPAIRMENTS decreased activity tolerance, decreased endurance, difficulty walking, decreased strength, increased muscle spasms, impaired flexibility, improper body mechanics, postural dysfunction, and pain.   ACTIVITY LIMITATIONS carrying, lifting, sitting,  standing, reach over head, hygiene/grooming, and locomotion level  PARTICIPATION LIMITATIONS: meal prep, cleaning, laundry, driving, community activity, and yard work  PERSONAL FACTORS Past/current experiences, Time since onset of injury/illness/exacerbation, and 3+ comorbidities: fibromyalgia depression, anxiety,  are also affecting patient's functional outcome.   REHAB POTENTIAL: Good  CLINICAL DECISION MAKING: Evolving/moderate complexity  EVALUATION COMPLEXITY: Moderate   GOALS: Goals reviewed with patient? Yes  SHORT TERM GOALS: Target date: 10/17/2023    Return to regular performance of HEP 4-5x/week to improve mobility  Baseline: 3-5x/wk (met) Goal status: MET  2.  Rate housework > or = to 4/10 on patient specific functional scale (PSFS) Baseline: 5/10 Goal status: MET  3.  Verbalize and demonstrate abdominal massage daily to reduce GI pain Baseline:  Goal status: Met 8/11    LONG TERM GOALS: Target date: 02/04/24    Be independent in advanced HEP Baseline:  Goal status:  Ongoing  2.  Rate squatting for yard work > or = to 4/10 on PSFS Baseline: 3/10 (11/14/23) Goal status:NEW  3.  Report > or = to 70% reduction in neck and thoracic pain with daily tasks  Baseline: 55% (01/02/24) Goal status: in progress   4.  Walk for exercise and perform regular exercises 3-5x/wk for exercise to improve endurance  Baseline: some weeks, more consistent now (11/14/23) Goal status: in progress   5.  Rate housework > or = to 6/10 on PSFS  Baseline: 5/10  (11/14/23) Goal status: revised    PLAN: PT FREQUENCY: 1-2x/week  PT DURATION: 12 weeks  PLANNED INTERVENTIONS: 97164- PT Re-evaluation, 97110-Therapeutic exercises, 97530- Therapeutic activity, 97112- Neuromuscular re-education, 97535- Self Care, [id number]- Manual therapy, [phone]- Gait training, [phone]- Aquatic Therapy, [phone]- Electrical stimulation (unattended), [phone]- Electrical stimulation (manual), [id number]- Traction  (mechanical), 20560 (1-2 muscles), 20561 (3+ muscles)- Dry Needling, Patient/Family education, Balance training, Taping, Joint mobilization, Spinal manipulation, Spinal mobilization, Cryotherapy, Moist heat, Therapeutic exercises, Therapeutic activity, Neuromuscular re-education, and Self Care  PLAN FOR NEXT SESSION:: continued advancement of mobility, strength and manual to address tension   Delon Darner, PTA 01/09/24 12:31 PM

## 2024-01-10 ENCOUNTER — Encounter: Payer: Self-pay | Admitting: Psychiatry

## 2024-01-10 ENCOUNTER — Ambulatory Visit: Admitting: Psychiatry

## 2024-01-10 DIAGNOSIS — F4001 Agoraphobia with panic disorder: Secondary | ICD-10-CM | POA: Diagnosis not present

## 2024-01-10 DIAGNOSIS — F50811 Binge eating disorder, moderate: Secondary | ICD-10-CM | POA: Diagnosis not present

## 2024-01-10 DIAGNOSIS — F902 Attention-deficit hyperactivity disorder, combined type: Secondary | ICD-10-CM | POA: Diagnosis not present

## 2024-01-10 DIAGNOSIS — M797 Fibromyalgia: Secondary | ICD-10-CM

## 2024-01-10 DIAGNOSIS — F411 Generalized anxiety disorder: Secondary | ICD-10-CM

## 2024-01-10 DIAGNOSIS — F5105 Insomnia due to other mental disorder: Secondary | ICD-10-CM | POA: Diagnosis not present

## 2024-01-10 DIAGNOSIS — F331 Major depressive disorder, recurrent, moderate: Secondary | ICD-10-CM

## 2024-01-10 DIAGNOSIS — R7989 Other specified abnormal findings of blood chemistry: Secondary | ICD-10-CM

## 2024-01-10 DIAGNOSIS — F422 Mixed obsessional thoughts and acts: Secondary | ICD-10-CM | POA: Diagnosis not present

## 2024-01-10 DIAGNOSIS — F4310 Post-traumatic stress disorder, unspecified: Secondary | ICD-10-CM

## 2024-01-10 MED ORDER — MODAFINIL 100 MG PO TABS: 100.0000 mg | ORAL_TABLET | Freq: Every day | ORAL | 1 refills | Status: AC

## 2024-01-10 NOTE — Progress Notes (Signed)
 Danielle Bruce [id number] 1960/05/24 63 y.o.   Subjective:   Patient ID:  Danielle Bruce is a 63 y.o. (DOB May 23, 1960) female.  Chief Complaint:  Chief Complaint  Patient presents with   Follow-up   Depression   Anxiety    HPI Danielle Bruce presents for follow-up of multiple dxes.  visit in April and taken off lamotrigine  DT possible skin reaction.  Had appt with [facility] and they assume that skin reaction with itching was related to lamotrigine . 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 Per oncology most likely drug is lamotrigine . And she weaned off of the medication to determine if the cutaneous lesions are related. Lesions seem less without lamotrigine .  At this point skin reaction is presumed related to lamotrigine .  No other disease sx. She is well aware that she has been on lamotrigine  for many years with good results for depression control. There is a risk of relapse as she comes off of the medication and she is worried about that. Smart phone helps her cognitive  And other productivity.    seen October 2020.  Restarted low dosage ADD bc concerns over STM, concentration and productivity.  Ritalin  5 mg twice daily.  Also suggested she retry N-acetylcysteine for mild cognitive complaints.   seen April 07, 2019 the following was noted: Mostly good.  Anxiety is mostly better, but depression is a little worse seasonally.  Not able to go help her daughter.   Low energy but does have motivation.  Ritalin  helps energy and focus but doesn't take it regularly bc irregular sleep cycle.   Ativan  only when having medical issues.    Therapy working on trauma issues is very hard.  Struggling with the fact she doesn't have memory of large parts of her past.   No manic sx off mood stabilizers.  Covid isolation reduce stressors overall and not prominently  depressed.   No meds were changed.  June 25, 2019 appointment the following is noted: F real worried about Covid despite vaccination.   Started fluvoxamine  25 on May 14, 2019 from PCP mainly for inflammation but possibly thinking she might be depressed. Seen benefit for energy and sleep pattern and more appropriate feelings and less numb.  Now realizes she had been depressed for awhile but just numb with depression.  Anniversaries of deaths of friends including last BF [name] a little over a year ago.   Thinks of him daily. His 61 yo D died of unknown cause recently.  Stress D moved to beach. Wonders about increasing the fluvoxamine   [name] living in [place] and hasn't seen him in a year. [name] on disability for depression. Patient reports more depression.  Plan: Realized lately she's depressed and started Luvox  again for it and for the anti-inflammatory potential for her health.  Has seen some benefit.   Prior benefit at 25 mg daily.  Optiion increase but slightly bc med sensitive. AGREE increase fluvoxamine  to 37.5 mg daily.  08/06/2019 appointment with the following noted: Increased fluvoxamine  to 37.5 mg and it helped the depression. Could see a big difference and had been more depressed than she even realized. Outlook better and sleep pattern normalized.    More energy and motivation.   But also started having prolonged HA.  HA lasted a couple of weeks.   Assumed HA was DT increased fluvoxamine  so reduced to 25 mg on 07/28/19.  07/29/19  Episode of confusion occurred also.  Went to doctor while confused.  They couldn't determine cause.    Resolved in 1 day. FM flare up since fall/winter. To beach with daughter 3 mos minimum to give her shots for IVF treatment. Patient reports panic recently and recent difficulty with anxiety.    Occ flashbacks.   Patient denies difficulty with sleep initiation or maintenance. Denies appetite disturbance.  Patient reports that energy and motivation have been  good.  Patient denies any difficulty with concentration.  Patient denies any suicidal ideation. Plan: AGREE increase fluvoxamine  to 37.5 mg daily once HA is controlled for several weekcs.  01/01/20 appt with the following noted: Increased fluvoxamine  for awhile but got more HA and reduced it. Anxiety is better in general now.  But is interested in increasing it DT coming winter. When anxiety is not good it catches her off guard. Likely had Covid in August but early test was negative.  Got prednisone  but sick for 5 weeks.  Lost taste and smell still going on.  D also had Covid.  Felt really sick and terrified with bad mental health at the time.  Was traumatic for her.  Had been vaccinated.   Had panic attack at the ER and took a long time to get the Ativan .  D moving back to area and pt will have to help with childcare so hopes to more aggressively treat her health problems. 2 gkids and 2 more coming. [name] married without kids yet. Plan: started Luvox  again for it and for the anti-inflammatory potential for her health.  Has seen some benefit.   Prior benefit at 25 mg daily.  Optiion increase but slightly bc med sensitive. AGREE increase fluvoxamine  to 37.5 mg daily once HA is controlled for several weeks.  03/03/2020 appointment with the following noted: On Day 10 of Covid.  2nd bout. A lot easier this time. Had gotten Luvox  up to 50 mg daily and tolerated.  Then doubled it to 100 mg daily. Wonders about the proper dose for maintenance.   No taste and smell, exhausted, congestion and mild cough.  Energy is better than it was in the beginning.  Made her depressed and anxious again and that part is better also.  Last time had to go to ER with Covid.  Only ER visit in 2-3 years.  Less migraine than in the past. Overall was doing better with last couple months.  Trying to do anti-inflammatory diet.  Vegetarian, no dairy or gluten.  Natural sugars.   Still some anxiety. D moved back here and pregnant  with 2nd set of twins. Current twins are 61 yo.   Will restart trauma therapy and it's helped over the last couple of years.  More freedom from things.      Less daily dread with anxiety but still some panic which can be triggered.  Less OCD with fluvoxamine  unless triggered.   Taking Ritalin  10 mg each AM and tolerating and benefiting. occ NM with few dreams overall. Plan: started Luvox  again for it and for the anti-inflammatory potential for her health.  Has seen some benefit.   Prior benefit at 25 mg daily.  Med sensitive. AGREE increase fluvoxamine  to  50  mg daily. Up to 100 mg daily if desired.   04/22/19 appt with following noted: Mostly good and randomly psycho.  Really busy with [place] [redacted] weeks pregnant with twins and twins 61 yo at home.  She's helping and her H is away. Mo older  and slower.  Pt overworking.  When with [place] kids interfere with sleep.  Doesn't eat as well with Corean affects her FM and energy.   Recently exhausted and needed rest but couldn't go home DT D had to go to hospital.  Her H critical at times and she lost it emotionally over this. Almost suicidal reaction with weird intrusive thoughts in response to the criticism.   Got a break and it helped.   Rheum adjusted Lyrica to BID. Had accident fall with one of babies and was bleeding.  She became hysterical.   Upset now that she couldn't control her emotions at the time.  Couldn't get herself under control to deal with the crisis.    Overall doing ok but doesn't handle stressors well and lose it.   Plan: AGREE with increase fluvoxamine  to  50  mg daily. Up to 100 mg daily if desired.  06/23/2020 appointment with the following noted: Increased fluvoxamine  25 AM and 50 mg PM and tolerated it.  Been on this dose awhile.  Going to [place] May 6-19 and son taking her.  Working for [employer] and been successful. Also going to [place], [place].  Excited.   Occ overwhelmed. Starting therapy with Kayla and looking  forward to it and dealing with trauma stuff. Nervous about doing the therapy and getting decompensated like what happened last time. Prednisone  hellps mood and body. Not as depressed as in the past.  Sleep is reasonably good.  Appetite is normal.  Anxiety intermittent.  No suicidal thoughts Plan: No med changes  08/19/2020 appointment with the following noted: Not good.  Dog died 2 week ago and was very close to her for 16 years.  She had been a great comfort during losses and health problems.  Dog helped her through periods of SI. Exhausted. [name] in [place] and going in early July. Wonders how long this will last.  Also increased fluvoxamine  and now out and having withdrawal. Disc difference between grief and depression. Elsie needs therapist who takes Medicaid. Plan: no med changes  01/10/21 appt noted: Had covid 3rd time. Still on fluvox 50, Ritalin  10 mg daily, Deplin, trazodone  50 mg HS. Helping with gkids and been sick a lot since September. Been doing so so overall.  At times is good. Son-in-law will be getting raise and D can quit work for a year or so. [name] and his wife in [place] and that's good. Parents getting old. Both parents hearing problems and M more forgetful. Parents personality different.  Less therapy lately bc schedules.  Has dealt with a lot of trauma and will resume after new year and feels it has been helpful.  04/13/21 appt noted: More problems with appetite increase for a couple of years.  Always hungry.  Wonders about changing stimulant for this.  Weight up too much. Needs to lose 15+ # DT chronic pain including hips and knees. More depression.  Seasonally.  Episodic panic and anxiety chronic. Some intermittent conflict with daughter.   Needs Ativan  intermittently. Still working through trauma issues. Continues therapy.   Haven't gotten back into church.  Not wanting to go out much. Plan: Benefit  Ritalin  10 mg AM and noon for cognitive and ADD reasons and  disc risk of palpitations.  Per request ok trial Vyvanse  for binge eating and ADD instead of Ritalin  per PCP suggestion May also help mood and weight loss should help pain. Caution it could cause anxiety  06/06/2021 appointment with the following noted: Did get  a prescription for Vyvanse  30 mg on 04/28/2021 filled.  Tried that in place of Ritalin . Better energy and focus with Vyvanse  and less binge eating and lost 7-8 #.  Helped lose prednisone  weight from November. Seems to wear off in 5-6 hours.   In afternoon when it wears off just wants to sit around.  No sig SE. Thinks she is still a little depressed and not enjoying things like she should. Chronic colitis with recent flare.   To [place] in [place] for a couple of weeks with son and his wife. Plan: Per request ok trial Vyvanse  for binge eating and ADD and increase  to 40 mg daily per PCP suggestion  08/11/2021 appointment with the following noted: Tolerating increase Vyvanse  40 AM.  No SE.   Less crash with it and longer duration. Lost 14#.  Less craving.   Still kind of depressed.  B moved back after divorce.  F mad about it.  M health problems. Home more stressful.   Went to [place]. M acting odd and F cognitive problems. Poor sleep at home bc temp is so warm for parents. OC sx a little worse but not severe. Plan: Better with Vyvanse  for binge eating and ADD and increase  to 40 mg daily per PCP suggestion May also help mood and weight loss should help pain. Continue fluvoxamine  50 mg twice daily  11/16/2021 appointment noted: Helping with gkids. Son in social worker still working out of state.  F not doing well with cognition and falls. 63yo. Handling stressors pretty well.  When irritable then takes a break. Enjoys grandkids 4 of them. D therapist and not working right now other than taking care of kids. Restarted PT which has helped in the past with pain and limitations physically. Starting therapy with Peggye Macintosh end of October. sTill  taking vyvanse  40 and fluvoxamine  50 mg BID. Has missed some Vyvanse  bc some days sleeping late. More productive with Vyvanse  40 mg AM, but still procrastinates. Can read again better and sustain attention better but not as productive as she would like. Still a fair amount of anxiety easily triggered anxiety at times and hopes therapy will help.  Claustrophobic easily with panic triggered. Depression is OK lately.  02/16/22 appt noted: Had asthma episode and had to leave.  She was not in severe distress and was able to verbalize that she could drive herself home.  04/26/22 appt noted: Doing well.  Overall.  If gets up late then skips vyvanse  and then is sleepy.   Back and forth between [facility] and her house where temp is high for mother.  B at her house and always cold.  No HA with Stephanie's.   F April 17, 2022 died with dementia.  Declined quickly.  Didn't have to be placed.  Has been ok.  Gkids talk about him.  M doing pretty well and grateful he passed before being placed.   Eating healthy.   Asks whether needs to continue fluvoxamine .   Still on fluvoxamine  50 BID, Vyvanse  40 AM, trazdone 50 MG Hs Mood is good. Counseling with Bambi Cottle is already helpful. Still some anxiety but getting better recognizing triggers.  Can catastrophixize and working on CBT.  Still episodes out of the blue. Energy good with Vyvanse . Sleep is ok at D's. Plan no med changes:  07/27/2022 appointment noted: Psych meds fluvoxamine  50 mg twice daily, Vyvanse  50 mg every morning, L-methylfolate 15 mg daily, lorazepam  1 mg as needed panic, Lyrica 50 mg twice daily.  Trazodone  50 mg nightly as needed insomnia. No SE.   Vyvanse  helps focus, binge eating .  Was out 5 days and overate.  More productive with it.  Better socialization with it.  Lost wt on it. Dose seems right . Sick with URI. Otherwise doing well.   D moved back [place] .  Had breakdown with conflict with D with thoughts of wanting to escape but  came out of it.  Therapist Bambi Cottle helped her.  Less often panic but has them at times.  Therapist helping her to recognize triggers.  Dep is under control.  Not desperate. Will have flareup with FM.  But has been active with gkids. When with kids sleeps well.   Plan no changes  10/25/22 appt noted: Meds as above & flexeril 10 mg HS.  Not often with lorazepam .   Pending cataract surgery in fall. Working in therapy to try to resolve issues that affect her body.  It is making her more self aware.  Hasn't started EMDR yet, but working on CBT.   F 04/07/22 died with dementia. Wants to try methylated B6 and B12 to see if it helps. If rough mental health day then needs Ativan . Doing pool therapy.   Satisfied with Vyvanse  50 AM.  Has to take by 930 AM to keep from having insomnia.  Helps energy and motivation.  Still needs to work on productivity.    Still some migraine.  Migraine's will cause neuro Sx like word finding, visual. OCD sx been worse lately.  Cleanliness concerns in public.  Lulu to go to gym which produces anxiety.  Not always as bad.   Overall better on the whole but still bad days at times even with SI not often.   Still prefers to avoid going out, church, friends.  M's favorite child, B, has moved back home.   Creates some stress. Vyvanse  solved binge eating generally.  01/04/23 appt noted: More energy with B12 shots. In a bit of FM flare up.   In general doing well mood wise.   Not taking ADD med bc weird CP .  Going to card today to be checked out.  Vyvanse  50 is messing with sleep.  Wants to reduce to 40 mg AM Cut back fluvoxamine  to 50 mg without problems.  Maybe energy is better Lysle is good for her.   Usually sleeps with trazodone  50 and melatonin 3 mg .   Had panic at eye doctor over eye concerns.   Plan:  fluvoxamine  50 mg reduced daily,  Reduced Vyvanse  40 mg every morning DT insomnia,  L-methylfolate 15 mg daily, lorazepam  1 mg as needed panic, Lyrica 50 mg  twice daily.   Trazodone  50 mg nightly as needed insomnia.  03/08/23 appt noted: Meds as above Dad died a year ago.  Feels more depressed.  Spent Xmas wit D's family and enjoyed it.  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  Was busy there.  When got home slept most of 48 hours. Last couple of days took Vyvanse .  But interfering with sleep when not active.   At home no structure and will get more negative thoughts.   When on prednisone  feels better and more productive and better mood.  Son Will transitioned to [place] and living at home. She is more depressed and that drags her down.  4 people at home.    07/05/23 appt noted: Med: vyvanse  40 or Adderall XR 20, incr fluvox 75 for OCD Never had therapist  like Bambi Cottle.  Really good. Would like diff stimulant bc ins and still some binge eating usually in the evening.   SE Vyvanse  ins Tried 40 and 50 worse ins. Adderall XR   20 doesn't help EDO as much but helps focus without insomnia.   Is alternating time. Plan no changes  10/11/23 appt noted:  Med: vyvanse  40 or Adderall XR 20, incr fluvox 75 for OCD, L-methylfolate, trazodone  50 mg HS Good therapy with Bambi Cottle.  Nobody helped her as much.   Predominantly Adderall vs vyvanse .  SE mouth movments.  Not taken in 2 weeks. Son Jacob also takes it. 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  At 10 mg XR.   She wants to reduce it.   Adderall helps conc and task completion and distractibility.  More acitve with it.  In general mental health is good.  Making progress in therapy.  Hopeful.  Reads.   Not sig dep. Elsie struggles with mental health issues.   Ongoing intermittent GI. Intermittent colitis.  And pending colonoscopy.   Dr. Loreli is good doc [facility] location.   Adderall helps conc and task completion and distractibility.  More acitve with it.   01/10/24 appt noted: Med: rare Adderall XR 10, fluvox 75 for OCD, L-methylfolate, trazodone  50 mg HS, 3 mg melatonin No sig  stimulant DT raw tongue.  Not as functional .  Eat too much good food without stimulant and it helped ADD [facility] has come back home and she is not psychotic.  Judyann Pili MD in another therapist.  She has chronic ongoing identity issues.   Pt still seeing therapist, Bambi.  EMDR is helping.   No other concerns with meds.    Past psych meds: Lithium, carbamazepine, oxcarbazepine, topiramate , valproic acid, lamotrigine , gabapentin   Lyrica ? effect Zyprexa, Seroquel, Latuda headaches, Geodon,  Risperidone, Stelazine,  Rexulti,  Wellbutrin with side effects,  sertraline irritability, duloxetine,  fluvoxamine  100, fluoxetine, Stopped desipramine DT skin issues which now resolved.  It helped GI px.  buspirone side effects,   N-acetylcysteine,  pramipexole,   Methylphenidate  NR,  Adderall,  Vyvanse  50 SE insomnia helps  Ambien with amnesia, trazodone , Ativan   Review of Systems:  Review of Systems  HENT:  Negative for congestion.   Gastrointestinal:  Positive for abdominal pain. Negative for nausea.  Musculoskeletal:  Positive for back pain and myalgias.  Neurological:  Positive for headaches. Negative for tremors.  Psychiatric/Behavioral:  Positive for decreased concentration. Negative for behavioral problems and dysphoric mood. The patient is nervous/anxious.   Less HA with new meds.  Medications: I have reviewed the patient's current medications.  Current Outpatient Medications  Medication Sig Dispense Refill   cyclobenzaprine (FLEXERIL) 10 MG tablet Take 10 mg by mouth at bedtime as needed.     fluvoxaMINE  (LUVOX ) 25 MG tablet Take 3 tablets (75 mg total) by mouth at bedtime. 225 tablet 1   L-Methylfolate 15 MG TABS TAKE 1 TABLET BY MOUTH DAILY 30 tablet 11   traZODone  (DESYREL ) 50 MG tablet TAKE 1 TABLET(50 MG) BY MOUTH AT BEDTIME 90 tablet 0   albuterol  (PROVENTIL ) (2.5 MG/3ML) 0.083% nebulizer solution Take 3 mLs (2.5 mg total) by nebulization every 4 (four) hours as needed for  wheezing or shortness of breath (coughing fits). 75 mL 1   albuterol  (VENTOLIN  HFA) 108 (90 Base) MCG/ACT inhaler Inhale 2 puffs into the lungs every 4 (four) hours as needed for wheezing or shortness of breath (coughing fits). 18 g 1  azelastine  (ASTELIN ) 0.1 % nasal spray Place 1-2 sprays into both nostrils 2 (two) times daily as needed (nasal drainage). Use in each nostril as directed 30 mL 3   budesonide-formoterol (SYMBICORT) 80-4.5 MCG/ACT inhaler Inhale 2 puffs into the lungs in the morning and at bedtime. with spacer and rinse mouth afterwards. Use it for 1-2 weeks during flares. 1 each 3   Calcium  Carb-Cholecalciferol (CALCIUM  1000 + D) 1000-20 MG-MCG TABS Take by mouth.     cetirizine  (ZYRTEC ) 10 MG tablet Take 1 tablet (10 mg total) by mouth daily. (Patient taking differently: Take 10 mg by mouth as needed.) 30 tablet 11   diphenhydrAMINE  (BENADRYL ) 25 mg capsule Take 50 mg by mouth every 6 (six) hours as needed (HEADACHES).     EPINEPHrine  0.3 mg/0.3 mL IJ SOAJ injection Inject 0.3 mg into the muscle as needed for anaphylaxis. 2 each 1   Erenumab-aooe 70 MG/ML SOAJ Inject into the skin every 28 (twenty-eight) days.     fluticasone  (FLONASE ) 50 MCG/ACT nasal spray Place 1-2 sprays into both nostrils daily as needed (nasal congestion). 16 g 3   hydrocortisone (ANUSOL-HC) 25 MG suppository 1 suppository (Patient not taking: Reported on 12/12/2023)     ibuprofen  (ADVIL ) 800 MG tablet 1 tablet with food or milk as needed (Patient taking differently: as needed.)     LORazepam  (ATIVAN ) 1 MG tablet TAKE 1 TABLET BY MOUTH EVERY 8 HOURS AS NEEDED FOR ANXIETY( TAKE 2 MG WHEN HAVING A MEDICAL PROCEDURE) (Patient taking differently: as needed.) 30 tablet 0   modafinil (PROVIGIL) 100 MG tablet Take 1 tablet (100 mg total) by mouth daily. (Patient not taking: Reported on 01/10/2024) 30 tablet 1   NURTEC 75 MG TBDP Take 1 tablet by mouth daily as needed.     Olopatadine  HCl 0.2 % SOLN Apply 1 drop to eye  daily as needed (itchy/watery eyes). 2.5 mL 5   oxyCODONE -acetaminophen  (PERCOCET/ROXICET) 5-325 MG per tablet Take 2 tablets by mouth daily as needed for moderate pain or severe pain (mighraine.).      polyethylene glycol (MIRALAX  / GLYCOLAX ) packet Take 17 g by mouth daily as needed for mild constipation. (Patient not taking: Reported on 12/12/2023)     PROLIA 60 MG/ML SOSY injection BRING TO THE OFFICE FOR INJECTION ON 02/15/2018 AS DIRECTED ONCE EVERY 6 MONTHS     promethazine  (PHENERGAN ) 25 MG tablet Take 25 mg by mouth every 6 (six) hours as needed for nausea.     Respiratory Therapy Supplies (NEBULIZER/TUBING/MOUTHPIECE) KIT Use with nebulizer as needed. 1 kit 1   rosuvastatin  (CRESTOR ) 10 MG tablet TAKE 1 TABLET(10 MG) BY MOUTH DAILY 90 tablet 1   VASCEPA 1 g CAPS TK 2 CS PO BID WC  3   VOLTAREN 1 % GEL Apply 2 g topically 4 (four) times daily as needed (JOINT PAIN).   3   No current facility-administered medications for this visit.    Medication Side Effects: None  Allergies:  Allergies  Allergen Reactions   Sulfa Antibiotics Nausea And Vomiting    Causes pt to vomit blood Other reaction(s): vomiting blood   Gluten Meal Other (See Comments)    Sensitivity.    Lactose Intolerance (Gi) Nausea And Vomiting   Aspirin     Other reaction(s): colitis   Bromfed Dm [Pseudoeph-Bromphen-Dm]     Other reaction(s): strung out and hallucinating   Chlorpromazine     Other reaction(s): Dark thoughts   Desipramine     Other reaction(s): rash  Gabapentin      Other reaction(s): swelling / fuzzy vision   Hydrocodone      Other reaction(s): causes hangovers   Lamotrigine      Other reaction(s): rash   Metronidazole     Other reaction(s): vomiting blood   Morphine Sulfate     Other reaction(s): tearful   Nsaids     Other reaction(s): colitis   Flagyl [Metronidazole Hcl] Nausea And Vomiting   Morphine And Codeine  Other (See Comments)    Reaction: Depression, emotional    Past  Medical History:  Diagnosis Date   Abdominal pain, unspecified site 10/18/2012   Anemia    Anxiety    Arthritis    osteoarthritis   ASCUS (atypical squamous cells of undetermined significance) on Pap smear 05/06/2005   NEG HIGH RISK HPV--C&B BIOPSY BENIGN 12/2005   Asthma    Bipolar 2 disorder (HCC)    Cancer (HCC)    skin cancer - basal cell   Colon polyps    Complication of anesthesia    anxious afterwards, will get headaches    Constipation    Depression    Fibromyalgia 10/2013   GERD (gastroesophageal reflux disease)    Hearing loss on left    Heart murmur    never had any problems   Hemorrhoids    High cholesterol    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   cytology negative   IBS (irritable bowel syndrome)    Insomnia    Lymphocytic colitis    Lymphoma (HCC)    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   Bone marrow biopsy showes 8% plasma cells IgA Lambda   Migraines    Nausea alone 10/18/2012   Osteoarthritis    Osteopenia    Peripheral neuropathy    PTSD (post-traumatic stress disorder)     Family History  Problem Relation Age of Onset   Allergic rhinitis Mother    Allergic rhinitis Father    Diabetes Father    Hypertension Father    Hyperlipidemia Father    Allergic rhinitis Sister    Allergic rhinitis Brother    Allergic rhinitis Brother    Allergic rhinitis Maternal Aunt    Allergic rhinitis Maternal Uncle    Allergic rhinitis Paternal Aunt    Allergic rhinitis Paternal Uncle    Allergic rhinitis Maternal Grandmother    Allergic rhinitis Maternal Grandfather    Heart disease Maternal Grandfather    Allergic rhinitis Paternal Grandmother    Heart disease Paternal Grandmother    Allergic rhinitis Paternal Grandfather    Heart disease Paternal Grandfather    Allergic rhinitis Daughter    Depression Daughter    Anxiety disorder Daughter    Asthma Daughter    Atopy Son    Eczema Son    Allergic rhinitis Son    Depression Son     Atopy Son    Allergic rhinitis Son    Depression Son    Anxiety disorder Son    Breast cancer Neg Hx     Social History   Socioeconomic History   Marital status: Divorced    Spouse name: Not on file   Number of children: 3   Years of education: Not on file   Highest education level: Not on file  Occupational History   Not on file  Tobacco Use   Smoking status: Former    Current packs/day: 0.00    Types: Cigarettes    Quit date: 05/13/2014    Years since quitting: 9.6  Smokeless tobacco: Never   Tobacco comments:    social   Vaping Use   Vaping status: Never Used  Substance and Sexual Activity   Alcohol use: Yes    Alcohol/week: 0.0 standard drinks of alcohol    Comment: rare   Drug use: Never   Sexual activity: Not Currently    Comment: -1st intercourse 63 yo-More than 5 partners  Other Topics Concern   Not on file  Social History Narrative   Not on file   Social Drivers of Health   Financial Resource Strain: Medium Risk (12/10/2023)   Received from [facility]   Overall Financial Resource Strain (CARDIA)    How hard is it for you to pay for the very basics like food, housing, medical care, and heating?: Somewhat hard  Food Insecurity: No Food Insecurity (12/10/2023)   Received from [facility]   Hunger Vital Sign    Within the past 12 months, you worried that your food would run out before you got the money to buy more.: Never true    Within the past 12 months, the food you bought just didn't last and you didn't have money to get more.: Never true  Transportation Needs: No Transportation Needs (12/10/2023)   Received from [facility] - Transportation    In the past 12 months, has lack of transportation kept you from medical appointments or from getting medications?: No    In the past 12 months, has lack of transportation kept you from meetings, work, or from getting things needed for daily living?: No  Physical Activity: Inactive (12/10/2023)    Received from [facility]   Exercise Vital Sign    On average, how many days per week do you engage in moderate to strenuous exercise (like a brisk walk)?: 0 days    Minutes of Exercise per Session: Not on file  Stress: Stress Concern Present (12/10/2023)   Received from [facility] - Occupational Stress Questionnaire    Do you feel stress - tense, restless, nervous, or anxious, or unable to sleep at night because your mind is troubled all the time - these days?: To some extent  Social Connections: Socially Isolated (12/10/2023)   Received from [facility]   Social Network    How would you rate your social network (family, work, friends)?: Little participation, lonely and socially isolated  Intimate Partner Violence: Not At Risk (12/10/2023)   Received from [facility]   HITS    Over the last 12 months how often did your partner physically hurt you?: Never    Over the last 12 months how often did your partner insult you or talk down to you?: Never    Over the last 12 months how often did your partner threaten you with physical harm?: Never    Over the last 12 months how often did your partner scream or curse at you?: Never    Past Medical History, Surgical history, Social history, and Family history were reviewed and updated as appropriate.   Please see review of systems for further details on the patient's review from today.   Objective:   Physical Exam:  LMP 12/28/2010   Physical Exam Constitutional:      General: She is not in acute distress. Musculoskeletal:        General: No deformity.  Neurological:     Mental Status: She is alert and oriented to person, place, and time.  Coordination: Coordination normal.  Psychiatric:        Attention and Perception: Attention and perception normal. She does not perceive auditory or visual hallucinations.        Mood and Affect: Mood is anxious and depressed. Affect is not  labile, angry or tearful.        Speech: Speech normal.        Behavior: Behavior normal.        Thought Content: Thought content normal. Thought content is not paranoid or delusional. Thought content does not include homicidal or suicidal ideation. Thought content does not include suicidal plan.        Cognition and Memory: Cognition and memory normal.        Judgment: Judgment normal.     Comments: Insight intact Depression better than last time       Lab Review:     Component Value Date/Time   NA 140 01/10/2023 1125   NA 144 07/12/2015 1521   K 4.5 01/10/2023 1125   K 3.8 07/12/2015 1521   CL 103 01/10/2023 1125   CO2 24 01/10/2023 1125   CO2 27 07/12/2015 1521   GLUCOSE 93 01/10/2023 1125   GLUCOSE 98 10/30/2022 1702   GLUCOSE 82 07/12/2015 1521   BUN 16 01/10/2023 1125   BUN 10.3 07/12/2015 1521   CREATININE 0.61 01/10/2023 1125   CREATININE 0.87 08/01/2016 1713   CREATININE 0.8 07/12/2015 1521   CALCIUM  9.4 01/10/2023 1125   CALCIUM  9.9 07/12/2015 1521   PROT 7.4 08/01/2016 1713   PROT 7.4 07/12/2015 1521   ALBUMIN 4.8 08/01/2016 1713   ALBUMIN 4.4 07/12/2015 1521   AST 23 08/01/2016 1713   AST 22 07/12/2015 1521   ALT 26 08/01/2016 1713   ALT 31 07/12/2015 1521   ALKPHOS 55 08/01/2016 1713   ALKPHOS 54 07/12/2015 1521   BILITOT 0.7 08/01/2016 1713   BILITOT 0.40 07/12/2015 1521   GFRNONAA >60 10/30/2022 1702   GFRNONAA 76 08/01/2016 1713   GFRAA 88 08/01/2016 1713       Component Value Date/Time   WBC 5.6 10/30/2022 1702   RBC 4.65 10/30/2022 1702   HGB 14.2 10/30/2022 1702   HGB 14.1 07/12/2015 1521   HCT 41.3 10/30/2022 1702   HCT 41.5 07/12/2015 1521   PLT 247 10/30/2022 1702   PLT 248 07/12/2015 1521   MCV 88.8 10/30/2022 1702   MCV 92.2 07/12/2015 1521   MCH 30.5 10/30/2022 1702   MCHC 34.4 10/30/2022 1702   RDW 12.0 10/30/2022 1702   RDW 13.3 07/12/2015 1521   LYMPHSABS 2,006 08/01/2016 1713   LYMPHSABS 1.8 07/12/2015 1521   MONOABS 354  08/01/2016 1713   MONOABS 0.7 07/12/2015 1521   EOSABS 118 08/01/2016 1713   EOSABS 0.1 07/12/2015 1521   BASOSABS 0 08/01/2016 1713   BASOSABS 0.0 07/12/2015 1521    No results found for: POCLITH, LITHIUM   No results found for: PHENYTOIN, PHENOBARB, VALPROATE, CBMZ   Genesight:  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  Diagnoses and all orders for this visit:  Attention deficit hyperactivity disorder (ADHD), combined type -     modafinil (PROVIGIL) 100 MG tablet; Take 1 tablet (100 mg total) by mouth daily. (Patient not taking: Reported on 01/10/2024)  PTSD (post-traumatic stress disorder)  Mixed obsessional thoughts and acts  Insomnia due to mental condition  Major depressive disorder,  recurrent episode, moderate (HCC)  Generalized anxiety disorder  Fibromyalgia  Panic disorder with agoraphobia  Binge-eating disorder, moderate  Low vitamin D  level    Past couple of years more obvious PTSD obvious has called into doubt the bipolar dix bc she's remained mood stable off the lamotrigine .    She has had symptoms consistent with hypomania in the past but there is now some question as to whether they were PTSD related rather than truly bipolar 2.  Her son has had treatment resistant depression with possible bipolar disorder and her daughter has been diagnosed with bipolar disorder but also a history of borderline personality disorder.  So there is a family history of the spectrum.  Better with grief.     Prior benefit fluvoxamine  for anxiety and mood disorders.  No SE Continue  increase fluvoxamine  75 mg daily for dep and OCD  Clear benefit from fluvoxamine  for mood quite significantly. No manic sx. Consider weaning when therapy has helped a little more and further progress.  HA are better 2-4/month.  Still has them at times.    Disc sleep hygiene  and temperature.     discussed the short-term risks associated with benzodiazepines including sedation and increased fall risk among others.  Discussed long-term side effect risk including dependence, potential withdrawal symptoms, and the potential eventual dose-related risk of dementia.  But recent studies from 2020 dispute this association between benzodiazepines and dementia risk. Newer studies in 2020 do not support an association with dementia.   Keep this at a minimum given cognitive concerns.  She only uses it when triggered with anxiety usually medical.  She is not using it regularly. Continue Ativan  prn 1 mg for panic and triggers.  Able to use much less since therapy  Discussed potential benefits, risks, and side effects of stimulants with patient to include increased heart rate, palpitations, insomnia, increased anxiety, increased irritability, or decreased appetite.  Instructed patient to contact office if experiencing any significant tolerability issues.  Switch stimulant to modafinil 50 am bc tongue soreness with Adderall May also help mood and weight loss should help pain.  Has been helpful.   Disc timing and options.   Caution it could cause anxiety and other SE  Therapy helping emotional management. Disc jury letter for excuse to PTSD.   Still gets triggered some.    trial methylated  B12.  L-methylfolate 15 mg daily, lorazepam  1 mg as needed panic,  stopped DT wt gain Lyrica 50 mg twice daily.   Trazodone  50 mg nightly as needed insomnia. Start modafinil 50 AM and stopped Adderall  FU 4 mos  Lorene Macintosh, MD, DFAPA    Please see After Visit Summary for patient specific instructions.  Future Appointments  Date Time Provider Department Center  01/15/2024  3:00 PM Cottle, Peggye MATSU, LCSW [facility] None  01/16/2024 11:00 AM Sophie Burnard HERO, PT [facility] None  01/22/2024  3:00 PM Cottle, Bambi G, LCSW [facility] None  01/23/2024 11:45 AM Judythe Nest L, PTA [facility]  None  01/29/2024  3:00 PM Cottle, Bambi G, LCSW [facility] None  01/30/2024 11:00 AM Sophie Burnard HERO, PT [facility] None  01/31/2024  1:00 PM Corey Rufus HERO, MD [facility] None  02/05/2024  3:00 PM Cottle, Peggye MATSU, LCSW [facility] None  02/12/2024  3:00 PM Cottle, Bambi G, LCSW [facility] None  02/19/2024  3:00 PM Cottle, Bambi G, LCSW [facility] None  02/26/2024  3:00 PM Cottle, Bambi G, LCSW [facility] None  03/04/2024  3:00 PM Cottle, Bambi  G, LCSW [facility] None  03/11/2024  3:00 PM Cottle, Bambi G, LCSW [facility] None  03/18/2024  3:00 PM Cottle, Bambi G, LCSW [facility] None  03/25/2024  3:00 PM Cottle, Bambi G, LCSW [facility] None  04/01/2024  3:00 PM Cottle, Bambi G, LCSW [facility] None  04/08/2024  3:00 PM Cottle, Bambi G, LCSW [facility] None  04/15/2024  3:00 PM Cottle, Bambi G, LCSW [facility] None  04/22/2024  3:00 PM Cottle, Bambi G, LCSW [facility] None  04/29/2024  3:00 PM Cottle, Bambi G, LCSW [facility] None  05/13/2024  3:00 PM Cottle, Bambi G, LCSW [facility] None  05/20/2024  3:00 PM Cottle, Bambi G, LCSW [facility] None  06/09/2024 11:30 AM Luke Orlan HERO, DO [facility] None    No orders of the defined types were placed in this encounter.      -------------------------------

## 2024-01-12 ENCOUNTER — Other Ambulatory Visit: Payer: Self-pay | Admitting: Psychiatry

## 2024-01-12 DIAGNOSIS — F5105 Insomnia due to other mental disorder: Secondary | ICD-10-CM

## 2024-01-15 ENCOUNTER — Ambulatory Visit (INDEPENDENT_AMBULATORY_CARE_PROVIDER_SITE_OTHER): Admitting: Psychology

## 2024-01-15 DIAGNOSIS — F4310 Post-traumatic stress disorder, unspecified: Secondary | ICD-10-CM | POA: Diagnosis not present

## 2024-01-15 DIAGNOSIS — F902 Attention-deficit hyperactivity disorder, combined type: Secondary | ICD-10-CM | POA: Diagnosis not present

## 2024-01-16 ENCOUNTER — Ambulatory Visit

## 2024-01-16 DIAGNOSIS — M6281 Muscle weakness (generalized): Secondary | ICD-10-CM | POA: Diagnosis not present

## 2024-01-16 DIAGNOSIS — R293 Abnormal posture: Secondary | ICD-10-CM | POA: Diagnosis not present

## 2024-01-16 DIAGNOSIS — R252 Cramp and spasm: Secondary | ICD-10-CM | POA: Diagnosis not present

## 2024-01-16 DIAGNOSIS — M79672 Pain in left foot: Secondary | ICD-10-CM | POA: Diagnosis not present

## 2024-01-16 DIAGNOSIS — M353 Polymyalgia rheumatica: Secondary | ICD-10-CM | POA: Diagnosis not present

## 2024-01-16 DIAGNOSIS — M79671 Pain in right foot: Secondary | ICD-10-CM | POA: Diagnosis not present

## 2024-01-16 NOTE — Therapy (Signed)
 OUTPATIENT PHYSICAL THERAPY TREATMENT     Patient Name: Danielle Bruce MRN: [id number] DOB:[birth year], 63 y.o., female Today's Date: 01/16/2024  Progress Note Reporting Period 11/21/23 to 01/16/24  See note below for Objective Data and Assessment of Progress/Goals.         PT End of Session - 01/16/24 1147     Visit Number 18    Date for Recertification  02/04/24    Authorization Type Medicare-KX needed    Progress Note Due on Visit 28    PT Start Time 1104    PT Stop Time 1148    PT Time Calculation (min) 44 min    Activity Tolerance Patient tolerated treatment well    Behavior During Therapy [facility] for tasks assessed/performed                               Past Medical History:  Diagnosis Date   Abdominal pain, unspecified site 10/18/2012   Anemia    Anxiety    Arthritis    osteoarthritis   ASCUS (atypical squamous cells of undetermined significance) on Pap smear 05/06/2005   NEG HIGH RISK HPV--C&B BIOPSY BENIGN 12/2005   Asthma    Bipolar 2 disorder (HCC)    Cancer (HCC)    skin cancer - basal cell   Colon polyps    Complication of anesthesia    anxious afterwards, will get headaches    Constipation    Depression    Fibromyalgia 10/2013   GERD (gastroesophageal reflux disease)    Hearing loss on left    Heart murmur    never had any problems   Hemorrhoids    High cholesterol    High risk HPV infection 08/2011   cytology negative   IBS (irritable bowel syndrome)    Insomnia    Lymphocytic colitis    Lymphoma (HCC)    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   Bone marrow biopsy showes 8% plasma cells IgA Lambda   Migraines    Nausea alone 10/18/2012   Osteoarthritis    Osteopenia    Peripheral neuropathy    PTSD (post-traumatic stress disorder)    Past Surgical History:  Procedure Laterality Date   BONE MARROW BIOPSY Left 12/18/2013   Plasma cell dyscrasia 8% population of plasma cells   BREAST BIOPSY  Right    benign stereo   CESAREAN SECTION  13,11,06   CHOLECYSTECTOMY N/A 10/16/2012   Procedure: LAPAROSCOPIC CHOLECYSTECTOMY;  Surgeon: Debby LABOR. Cornett, MD;  Location: [facility];  Service: General;  Laterality: N/A;   COLONOSCOPY     numerous times   DILATION AND CURETTAGE OF UTERUS     ESOPHAGOGASTRODUODENOSCOPY     HEMORRHOID SURGERY  1993   x3   IUD REMOVAL  02/2015   Mirena   LAPAROSCOPIC LYSIS OF ADHESIONS N/A 10/16/2012   Procedure: LAPAROSCOPIC LYSIS OF ADHESIONS;  Surgeon: Debby A. Cornett, MD;  Location: [facility];  Service: General;  Laterality: N/A;   LAPAROSCOPY N/A 10/16/2012   Procedure: LAPAROSCOPY DIAGNOSTIC;  Surgeon: Debby LABOR. Cornett, MD;  Location: [facility];  Service: General;  Laterality: N/A;   PELVIC LAPAROSCOPY     RADIOLOGY WITH ANESTHESIA N/A 12/16/2015   Procedure: MRI OF BRAIN WITH AND WITHOUT CONTRAST;  Surgeon: Medication Radiologist, MD;  Location: [facility];  Service: Radiology;  Laterality: N/A;   SHOULDER SURGERY  2007/2008   SPINE SURGERY  2010   cervical  Patient Active Problem List   Diagnosis Date Noted   Sensorineural hearing loss, bilateral 11/19/2023   GAD (generalized anxiety disorder) 12/26/2017   OCD (obsessive compulsive disorder) 12/26/2017   PTSD (post-traumatic stress disorder) 12/26/2017   DDD (degenerative disc disease), cervical 07/14/2016   Primary osteoarthritis of both feet 07/14/2016   Primary osteoarthritis of both hands 07/14/2016   Other fatigue 07/14/2016   History of IBS 07/14/2016   Osteopenia of multiple sites 07/14/2016   Fibromyalgia 01/13/2016   MGUS (monoclonal gammopathy of unknown significance) 12/23/2013   Chronic cholecystitis without calculus 10/18/2012   Abdominal pain 10/18/2012   Nausea alone 10/18/2012   Constipation 10/18/2012   Depression 10/18/2012   Anxiety    ASCUS (atypical squamous cells of undetermined significance) on Pap smear    IUD    Hemorrhoids 11/29/2010   Abdominal pain, left upper quadrant  11/29/2010    PCP:  Joen Gentry, MD  REFERRING PROVIDER: Joen Gentry, MD  REFERRING DIAG: fibromyalgia  THERAPY DIAG:  Abnormal posture  Cramp and spasm  Polymyalgia rheumatica  Rationale for Evaluation and Treatment Rehabilitation  ONSET DATE: chronic pain (fibromyalgia), bowel flare up 1.5 months ago  SUBJECTIVE:                                                                                                                                                                                                         SUBJECTIVE STATEMENT:  I was with my daughter and the kids last week.  I am exercising.  I want to needle my neck and shoulder blades due to tension   PERTINENT HISTORY:  Anxiety, bipolar disorder, depression, fibromyalgia, IBS, migraines, osteopenia, PTSD  PAIN: 01/16/24 Are you having pain: Lt hip is sore 4/10 Pain location:   Pain rating:  Pain description: irritating, annoying   Aggravating factors: doing too much, stress  Relieving factors: muscle relaxers, rest, stretching  PRECAUTIONS: Other: chronic pain syndrome and depression Other: slow progression with exercise due to chronic condition  WEIGHT BEARING RESTRICTIONS No  FALLS:  Has patient fallen in last 6 months? No  LIVING ENVIRONMENT: Lives with: lives with their family Lives in: House/apartment  OCCUPATION: on disability  PLOF: Independent with basic ADLs and Leisure: yardwork, walking  Pt cares for her 4 young grandchildren- difficulty with care including lifting and carrying  PATIENT GOALS be more active with less pain, exercise at the gym regularly, lift and carry grandchildren and laundry, improved ease with yardwork and housework, resume exercise  OBJECTIVE:   DIAGNOSTIC FINDINGS: none recent   PATIENT SURVEYS:  The Patient-Specific Functional Scale  Initial:  I am going to ask you to identify up to 3 important activities that you are unable to do or are having difficulty  with as a result of this problem.  Today are there any activities that you are unable to do or having difficulty with because of this?  (Patient shown scale and patient rated each activity)  Follow up: When you first came in you had difficulty performing these activities.  Today do you still have difficulty?  Patient-Specific activity scoring scheme (Point to one number):  0 1 2 3 4 5 6 7 8 9  10 Unable                                                                                                          Able to perform To perform                                                                                                    activity at the same Activity         Level as before                                                                                                                       Injury or problem  Activity   Lifting laundry and groceries/housework                           initial:  3/10  11/14/23: 5/10 01/16/24 5-6/10  2.    Yard work- up and down with squatting          Initial: 2/10  11/14/23: 3/10 01/16/24: 4/10       COGNITION: Overall cognitive status: Within functional limits for tasks assessed  SENSATION: WFL  POSTURE: rounded shoulders and forward head  PALPATION: Diffuse palpable tenderness over bil neck, upper traps, thoracic, lumbar and gluteals with trigger points.    CERVICAL ROM:   Stiff at end range and pain on Rt with Lt motions.  All WFLs  UPPER EXTREMITY ROM: UE A/ROM is limited by 20% into  flexion and abduction with pain at end range.  Hip flexibility is limited by 25% in all directions with pain in all directions.  UPPER EXTREMITY MMT: UE: 4+/5, LE 4+/5 except hip flexors 4-/5  TODAY'S TREATMENT:  01/16/24:  Nustep level 5 for 10 min- PT monitored and discussed pt status Ball roll outs 3 ways x10 each  Leg press: seat 5, 60# 2x10 bil, 40# single leg 2x10 Row: 25# 2x10 Trigger Point Dry Needling  Subsequent Treatment: Instructions provided previously at initial dry needling treatment.   Patient Verbal Consent Given: Yes Education Handout Provided: Previously Provided Muscles Treated: Bil cervical and thoracic multifidi, bil upper traps, bil rhomboids Electrical Stimulation Performed: No Treatment Response/Outcome: Utilized skilled palpation to identify bony landmarks and trigger points.  Able to illicit twitch response and muscle elongation.  Soft tissue mobilization to muscles needled following DN to further promote tissue elongation and decreased pain.       01/09/24: Pt arrives for aquatic physical therapy. Treatment took place in 3.5-5.5 feet of water. Water temperature was 91 degres F. Pt entered the pool via stairs independently.  -50% depth water walking with UE push pull with yellow floats 10x each direction. VC to walk as fast as she could to create a good resistive current.Ankle fins used. - High knee march 6x holding yellow floats by her side. Vc to keep heart lifted.  -Bil hip lifts 3 directions with ankle fins 20x each -Pink bells core work:  static stance arms forward and back 2x20 VC to increase speed -Standing against wall core/lat press with 2 rainbow floats 3x10 -Horseback bicycle on yellow 7 min, ankle fins  01/02/24:  Nustep level 4 for 10 min- PT monitored and discussed pt status Biceps curls to overhead press: 5# 2x10 -seated on ball  Ball roll outs 3 ways x10 each  Seated hamstring stretch 3x20 seconds               Seated piriformis stretch 2x30 seconds  Leg press: seat 5, 60# 2x10 bil, 40# single leg 2x10 Row: 25# 2x10 Step-up on bosu: forward and lateral x10 bil each  Sit to stand: 5# parallel and staggered stance x10 each-with high pull Sidestepping with yellow loop around thighs x3 laps along mat table      HOME EXERCISE PROGRAM:  Access Code: [email or web address]- previously provided  Access Code: [email or web address] URL: [email or web address] Date: 09/19/2023 Prepared by: Burnard  Exercises - Seated Cervical Flexion AROM  - 3 x daily - 7 x weekly - 1 sets - 3 reps - 20 hold - Seated Cervical Sidebending AROM  - 3 x daily - 7 x weekly - 1 sets - 3  reps - 20 hold - Seated Cervical Rotation AROM  - 3 x daily - 7 x weekly - 1 sets - 3 reps - 20 hold - Seated Correct Posture  - 1 x daily - 7 x weekly - 3 sets - 10 reps - Seated Diaphragmatic Breathing  - 5 x daily - 7 x weekly - 1 sets - 10 reps - Sidelying Open Book Thoracic Rotation with Knee on Foam Roll  - 2 x daily - 7 x weekly - 3 sets - 10 reps - Seated Transversus Abdominis Bracing  - 5 x daily - 7 x weekly - 1 sets - 10 reps - 5 hold - Seated Hamstring Stretch  - 3 x daily - 7 x weekly - 1 sets - 3 reps - 20 hold ASSESSMENT:  CLINICAL IMPRESSION: Pt is doing better overall with functional tasks.  Scores on PSFS improved for yardwork and housework are both improved.  She had tension and trigger points in her thoracic and cervical spine and had good response to dry needling and manual therapy.  Good response to dry needling with improved tissue mobility and twitch response.  Patient will benefit from skilled PT to address the below impairments and improve overall function.      OBJECTIVE IMPAIRMENTS decreased activity tolerance, decreased endurance, difficulty walking, decreased strength, increased muscle spasms, impaired flexibility, improper body mechanics, postural dysfunction, and pain.   ACTIVITY LIMITATIONS carrying, lifting, sitting, standing, reach over head, hygiene/grooming, and locomotion level  PARTICIPATION LIMITATIONS: meal prep, cleaning, laundry, driving, community activity, and yard work  PERSONAL FACTORS Past/current experiences, Time since onset of injury/illness/exacerbation, and 3+ comorbidities: fibromyalgia depression, anxiety,  are also affecting patient's functional outcome.   REHAB POTENTIAL: Good  CLINICAL DECISION MAKING: Evolving/moderate complexity  EVALUATION COMPLEXITY: Moderate   GOALS: Goals reviewed with patient? Yes  SHORT TERM GOALS: Target date: 10/17/2023    Return to regular performance of HEP 4-5x/week to improve mobility  Baseline:  3-5x/wk (met) Goal status: MET  2.  Rate housework > or = to 4/10 on patient specific functional scale (PSFS) Baseline: 5/10 Goal status: MET  3.  Verbalize and demonstrate abdominal massage daily to reduce GI pain Baseline:  Goal status: Met 8/11    LONG TERM GOALS: Target date: 02/04/24    Be independent in advanced HEP Baseline:  Goal status:  Ongoing  2.  Rate squatting for yard work > or = to 4/10 on PSFS Baseline: 4/10 (01/16/24) Goal status:MET  3.  Report > or = to 70% reduction in neck and thoracic pain with daily tasks  Baseline: 55% (01/16/24) Goal status: in progress   4.  Walk for exercise and perform regular exercises 3-5x/wk for exercise to improve endurance  Baseline: at least 3x/wk (01/16/24) Goal status: in progress   5.  Rate  housework > or = to 6/10 on PSFS  Baseline: 5-6/10  (01/16/24) Goal status: in progress     PLAN: PT FREQUENCY: 1-2x/week  PT DURATION: 12 weeks  PLANNED INTERVENTIONS: 97164- PT Re-evaluation, 97110-Therapeutic exercises, 97530- Therapeutic activity, 97112- Neuromuscular re-education, 97535- Self Care, [id number]- Manual therapy, [phone]- Gait training, [phone]- Aquatic Therapy, [phone]- Electrical stimulation (unattended), [phone]- Electrical stimulation (manual), [id number]- Traction (mechanical), 20560 (1-2 muscles), 20561 (3+ muscles)- Dry Needling, Patient/Family education, Balance training, Taping, Joint mobilization, Spinal manipulation, Spinal mobilization, Cryotherapy, Moist heat, Therapeutic exercises, Therapeutic activity, Neuromuscular re-education, and Self Care  PLAN FOR NEXT SESSION:: continued advancement of mobility, strength and manual to address tension   Burnard Joy, PT 01/16/24 12:10 PM

## 2024-01-17 NOTE — Progress Notes (Signed)
 [facility] Counselor/Therapist Progress Note  Patient ID: ZARRA GEFFERT, MRN: [id number],    Date:01/15/2024  Time Spent: 60 minutes Time In: 3:03  Time out:4:03  Treatment Type: Individual Therapy  Reported Symptoms: flashbacks, responding to triggers, depression, anxiety, being busy all of the time, dissociation  Mental Status Exam: Appearance:  Casual     Behavior: Appropriate  Motor: normal  Speech/Language:  Clear and Coherent  Affect: Blunt  Mood: pleasant  Thought process: normal  Thought content:   WNL  Sensory/Perceptual disturbances:   WNL  Orientation: oriented to person, place, time/date, and situation  Attention: Good  Concentration: Good  Memory: WNL  Fund of knowledge:  Good  Insight:   Good  Judgment:  Fair  Impulse Control: Good   Risk Assessment: Danger to Self:  No Self-injurious Behavior: No Danger to Others: No Duty to Warn:no Physical Aggression / Violence:No  Access to Firearms a concern: No  Gang Involvement:No   Subjective: The patient attended a face-to-face individual therapy in the office today.  The patient presents as pleasant and cooperative.  The patient continues to report that she feels like she is improving so much with the EMDR and the therapy that she is doing.  She reports that she went down to her daughter's house and helped her daughter and just feels so much less reactive than she has in the past.  We talked about the next things that we are going to target with the EMDR and we did not do EMDR today but we will move in that direction after the Thanksgiving holiday.  The patient talked about even feeling more differently about the other traumas that she has experienced in her life we identified the next thing to target and the cognitions that are associated with that situation.  Interventions: Cognitive Behavioral Therapy, Mindfulness Meditation, Eye Movement Desensitization and Reprocessing (EMDR), Insight-Oriented,  and Interpersonal, psychoeducation  Diagnosis:Attention deficit hyperactivity disorder (ADHD), combined type  PTSD (post-traumatic stress disorder)  Plan: Plan of Care: Client Abilities/Strengths  Insightful, motivated, Supportive family Client Treatment Preferences  Outpatient Individual therapy/EMDR  Client Statement of Needs  I think I have PTSD and I feel like I need another kind of therapy than talk therapy Treatment Level  Outpatient Individual therapy  Symptoms  Demonstrates an exaggerated startle response.:  (Status: maintained). Depressed  or irritable mood.: (Status:improved). Describes a reliving of the event,  particularly through dissociative flashbacks.: (Status: maintained). Displays a  significant decline in interest and engagement in activities.:  (Status: improved). Displays significant psychological and/or physiological distress resulting from internal and external  clues that are reminiscent of the traumatic event.: (Status: maintained).  Experiences disturbances in sleep.:  (Status: improved). Experiences disturbing  and persistent thoughts, images, and/or perceptions of the traumatic event.:  (Status: maintained). Experiences frequent nightmares.: (Status: maintained).  Feelings of hopelessness, worthlessness, or inappropriate guilt.: No Description Entered (Status:  improved). Has been exposed to a traumatic event involving actual or perceived threat of death or  serious injury.:  (Status: maintained). Impairment in social, occupational, or  other areas of functioning.:(Status: maintained). Intentionally avoids activities,  places, people, or objects (e.g., up-armored vehicles) that evoke memories of the event.: (Status: maintained). Intentionally avoids thoughts, feelings, or discussions related  to the traumatic event.: (Status: maintained). Reports difficulty concentrating as  well as feelings of guilt (Status:maintained). Reports response of intense fear,   helplessness, or horror to the traumatic event.:  (Status: maintained).  Problems Addressed  Unipolar Depression, Posttraumatic Stress Disorder (  PTSD), Posttraumatic Stress Disorder (PTSD),   Posttraumatic Stress Disorder (PTSD), Posttraumatic Stress Disorder (PTSD)  Goals 1. Develop healthy thinking patterns and beliefs about self, others, and the world that lead to the alleviation and help prevent the relapse of  depression. Objective Identify and replace thoughts and beliefs that support depression. Target Date: 2025/01/03 Frequency: Weekly Progress: 80 Modality: individual Related Interventions 1. Explore and restructure underlying assumptions and beliefs reflected in biased self-talk that  may put the client at risk for relapse or recurrence. 2. Conduct Cognitive-Behavioral Therapy (see Cognitive Behavior Therapy by Almarie; Overcoming Depression by Marine dunker al.), beginning with helping the client learn the connection among  cognition, depressive feelings, and actions. 2. Eliminate or reduce the negative impact trauma related symptoms have  on social, occupational, and family functioning. Objective Learn and implement personal skills to manage challenging situations related to trauma. Target Date: 2025/01/03 Frequency: Weekly Progress: 40 Modality: individual 3. No longer avoids persons, places, activities, and objects that are  reminiscent of the traumatic event. Objective Participate in Eye Movement Desensitization and Reprocessing (EMDR) to reduce emotional distress  related to traumatic thoughts, feelings, and images. Target Date: 2025/01/03 Frequency: Weekly Progress: 30 Modality: individual   Related Interventions 1. Utilize Eye Movement Desensitization and Reprocessing (EMDR) to reduce the client's  emotional reactivity to the traumatic event and reduce PTSD symptoms. Objective Learn and implement guided self-dialogue to manage thoughts, feelings, and urges brought  on by  encounters with trauma-related situations. Target Date: 2026-11/07 Frequency: Weekly Progress: 40 Modality: individual Related Interventions 1. Teach the client a guided self-dialogue procedure in which he/she learns to recognize  maladaptive self-talk, challenges its biases, copes with engendered feelings, overcomes  avoidance, and reinforces his/her accomplishments; review and reinforce progress, problemsolve obstacles. 4. No longer experiences intrusive event recollections, avoidance of event  reminders, intense arousal, or disinterest in activities or  relationships. 5. Thinks about or openly discusses the traumatic event with others  without experiencing psychological or physiological distress. Diagnosis Axis  none F43.10 (Posttraumatic stress disorder) - Open - [Signifier: n/a] Posttraumatic Stress  Disorder  Conditions For Discharge Achievement of treatment goals and objectives   Emiah Pellicano G Roshard Rezabek, LCSW

## 2024-01-22 ENCOUNTER — Ambulatory Visit (INDEPENDENT_AMBULATORY_CARE_PROVIDER_SITE_OTHER): Admitting: Psychology

## 2024-01-22 DIAGNOSIS — F902 Attention-deficit hyperactivity disorder, combined type: Secondary | ICD-10-CM | POA: Diagnosis not present

## 2024-01-22 DIAGNOSIS — F4310 Post-traumatic stress disorder, unspecified: Secondary | ICD-10-CM

## 2024-01-23 ENCOUNTER — Ambulatory Visit: Admitting: Physical Therapy

## 2024-01-23 ENCOUNTER — Encounter: Payer: Self-pay | Admitting: Physical Therapy

## 2024-01-23 DIAGNOSIS — M353 Polymyalgia rheumatica: Secondary | ICD-10-CM

## 2024-01-23 DIAGNOSIS — R293 Abnormal posture: Secondary | ICD-10-CM

## 2024-01-23 DIAGNOSIS — M6281 Muscle weakness (generalized): Secondary | ICD-10-CM | POA: Diagnosis not present

## 2024-01-23 DIAGNOSIS — M79672 Pain in left foot: Secondary | ICD-10-CM | POA: Diagnosis not present

## 2024-01-23 DIAGNOSIS — R252 Cramp and spasm: Secondary | ICD-10-CM | POA: Diagnosis not present

## 2024-01-23 DIAGNOSIS — M79671 Pain in right foot: Secondary | ICD-10-CM | POA: Diagnosis not present

## 2024-01-23 NOTE — Therapy (Signed)
 OUTPATIENT PHYSICAL THERAPY TREATMENT     Patient Name: Danielle Bruce MRN: [id number] DOB:Sep 28, 1960, 63 y.o., female Today's Date: 01/23/2024      PT End of Session - 01/23/24 1156     Visit Number 19    Date for Recertification  02/04/24    Authorization Type Medicare-KX needed    Progress Note Due on Visit 28    PT Start Time 1155   10 min late   PT Stop Time 1225    PT Time Calculation (min) 30 min    Activity Tolerance Patient tolerated treatment well    Behavior During Therapy [facility] for tasks assessed/performed                                Past Medical History:  Diagnosis Date   Abdominal pain, unspecified site 10/18/2012   Anemia    Anxiety    Arthritis    osteoarthritis   ASCUS (atypical squamous cells of undetermined significance) on Pap smear 05/06/2005   NEG HIGH RISK HPV--C&B BIOPSY BENIGN 12/2005   Asthma    Bipolar 2 disorder (HCC)    Cancer (HCC)    skin cancer - basal cell   Colon polyps    Complication of anesthesia    anxious afterwards, will get headaches    Constipation    Depression    Fibromyalgia 10/2013   GERD (gastroesophageal reflux disease)    Hearing loss on left    Heart murmur    never had any problems   Hemorrhoids    High cholesterol    High risk HPV infection 08/2011   cytology negative   IBS (irritable bowel syndrome)    Insomnia    Lymphocytic colitis    Lymphoma (HCC)    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   Bone marrow biopsy showes 8% plasma cells IgA Lambda   Migraines    Nausea alone 10/18/2012   Osteoarthritis    Osteopenia    Peripheral neuropathy    PTSD (post-traumatic stress disorder)    Past Surgical History:  Procedure Laterality Date   BONE MARROW BIOPSY Left 12/18/2013   Plasma cell dyscrasia 8% population of plasma cells   BREAST BIOPSY Right    benign stereo   CESAREAN SECTION  13,11,06   CHOLECYSTECTOMY N/A 10/16/2012   Procedure: LAPAROSCOPIC  CHOLECYSTECTOMY;  Surgeon: Debby LABOR. Cornett, MD;  Location: [facility];  Service: General;  Laterality: N/A;   COLONOSCOPY     numerous times   DILATION AND CURETTAGE OF UTERUS     ESOPHAGOGASTRODUODENOSCOPY     HEMORRHOID SURGERY  1993   x3   IUD REMOVAL  02/2015   Mirena   LAPAROSCOPIC LYSIS OF ADHESIONS N/A 10/16/2012   Procedure: LAPAROSCOPIC LYSIS OF ADHESIONS;  Surgeon: Debby A. Cornett, MD;  Location: [facility];  Service: General;  Laterality: N/A;   LAPAROSCOPY N/A 10/16/2012   Procedure: LAPAROSCOPY DIAGNOSTIC;  Surgeon: Debby LABOR. Cornett, MD;  Location: [facility];  Service: General;  Laterality: N/A;   PELVIC LAPAROSCOPY     RADIOLOGY WITH ANESTHESIA N/A 12/16/2015   Procedure: MRI OF BRAIN WITH AND WITHOUT CONTRAST;  Surgeon: Medication Radiologist, MD;  Location: [facility];  Service: Radiology;  Laterality: N/A;   SHOULDER SURGERY  2007/2008   SPINE SURGERY  2010   cervical   Patient Active Problem List   Diagnosis Date Noted   Sensorineural hearing loss, bilateral 11/19/2023  GAD (generalized anxiety disorder) 12/26/2017   OCD (obsessive compulsive disorder) 12/26/2017   PTSD (post-traumatic stress disorder) 12/26/2017   DDD (degenerative disc disease), cervical 07/14/2016   Primary osteoarthritis of both feet 07/14/2016   Primary osteoarthritis of both hands 07/14/2016   Other fatigue 07/14/2016   History of IBS 07/14/2016   Osteopenia of multiple sites 07/14/2016   Fibromyalgia 01/13/2016   MGUS (monoclonal gammopathy of unknown significance) 12/23/2013   Chronic cholecystitis without calculus 10/18/2012   Abdominal pain 10/18/2012   Nausea alone 10/18/2012   Constipation 10/18/2012   Depression 10/18/2012   Anxiety    ASCUS (atypical squamous cells of undetermined significance) on Pap smear    IUD    Hemorrhoids 11/29/2010   Abdominal pain, left upper quadrant 11/29/2010    PCP:  Joen Gentry, MD  REFERRING PROVIDER: Joen Gentry, MD  REFERRING DIAG:  fibromyalgia  THERAPY DIAG:  Abnormal posture  Polymyalgia rheumatica  Cramp and spasm  Muscle weakness (generalized)  Rationale for Evaluation and Treatment Rehabilitation  ONSET DATE: chronic pain (fibromyalgia), bowel flare up 1.5 months ago  SUBJECTIVE:                                                                                                                                                                                                         SUBJECTIVE STATEMENT: Upper back is feeling the yard work.    PERTINENT HISTORY:  Anxiety, bipolar disorder, depression, fibromyalgia, IBS, migraines, osteopenia, PTSD  PAIN: 01/16/24 Are you having pain: upper back is sore 4/10 Pain location:   Pain rating:  Pain description: irritating, annoying   Aggravating factors: doing too much, stress  Relieving factors: muscle relaxers, rest, stretching  PRECAUTIONS: Other: chronic pain syndrome and depression Other: slow progression with exercise due to chronic condition  WEIGHT BEARING RESTRICTIONS No  FALLS:  Has patient fallen in last 6 months? No  LIVING ENVIRONMENT: Lives with: lives with their family Lives in: House/apartment  OCCUPATION: on disability  PLOF: Independent with basic ADLs and Leisure: yardwork, walking  Pt cares for her 4 young grandchildren- difficulty with care including lifting and carrying  PATIENT GOALS be more active with less pain, exercise at the gym regularly, lift and carry grandchildren and laundry, improved ease with yardwork and housework, resume exercise  OBJECTIVE:   DIAGNOSTIC FINDINGS: none recent   PATIENT SURVEYS:  The Patient-Specific Functional Scale  Initial:  I am going to ask you to identify up to 3 important activities that you are unable to do or are having difficulty with as a result  of this problem.  Today are there any activities that you are unable to do or having difficulty with because of this?  (Patient shown  scale and patient rated each activity)  Follow up: When you first came in you had difficulty performing these activities.  Today do you still have difficulty?  Patient-Specific activity scoring scheme (Point to one number):  0 1 2 3 4 5 6 7 8 9  10 Unable                                                                                                          Able to perform To perform                                                                                                    activity at the same Activity         Level as before                                                                                                                       Injury or problem  Activity   Lifting laundry and groceries/housework                           initial:  3/10  11/14/23: 5/10 01/16/24 5-6/10  2.    Yard work- up and down with squatting          Initial: 2/10  11/14/23: 3/10 01/16/24: 4/10       COGNITION: Overall cognitive status: Within functional limits for tasks assessed  SENSATION: WFL  POSTURE: rounded shoulders and forward head  PALPATION: Diffuse palpable tenderness over bil neck, upper traps, thoracic, lumbar and gluteals with trigger points.    CERVICAL ROM:   Stiff at end range and pain on Rt with Lt motions.  All WFLs  UPPER EXTREMITY ROM: UE A/ROM is limited by 20% into flexion and abduction with pain at end range.  Hip flexibility is limited by 25% in all directions with pain in all directions.  UPPER EXTREMITY MMT: UE: 4+/5, LE 4+/5 except hip flexors  4-/5  TODAY'S TREATMENT:   01/23/24:Pt arrives for aquatic physical therapy. Treatment took place in 3.5-5.5 feet of water. Water temperature was 91 degres F. Pt entered the pool via stairs independently.  -50% depth water walking with UE push pull with yellow floats 10x each direction. VC to walk as fast as she could to create a good resistive current.Ankle fins used. - High knee march 6x holding yellow floats  by her side. Vc to keep heart lifted.  -Bil hip lifts 3 directions with ankle fins 20x each -Pink bells core work: static stance arms forward and back 2x20 VC to increase speed -Standing against wall core/lat press with 2 rainbow floats 3x10 -Horseback bicycle on yellow 7 min, ankle fins   01/16/24:  Nustep level 5 for 10 min- PT monitored and discussed pt status Ball roll outs 3 ways x10 each  Leg press: seat 5, 60# 2x10 bil, 40# single leg 2x10 Row: 25# 2x10 Trigger Point Dry Needling  Subsequent Treatment: Instructions provided previously at initial dry needling treatment.   Patient Verbal Consent Given: Yes Education Handout Provided: Previously Provided Muscles Treated: Bil cervical and thoracic multifidi, bil upper traps, bil rhomboids Electrical Stimulation Performed: No Treatment Response/Outcome: Utilized skilled palpation to identify bony landmarks and trigger points.  Able to illicit twitch response and muscle elongation.  Soft tissue mobilization to muscles needled following DN to further promote tissue elongation  and decreased pain.       01/09/24: Pt arrives for aquatic physical therapy. Treatment took place in 3.5-5.5 feet of water. Water temperature was 91 degres F. Pt entered the pool via stairs independently.  -50% depth water walking with UE push pull with yellow floats 10x each direction. VC to walk as fast as she could to create a good resistive current.Ankle fins used. - High knee march 6x holding yellow floats by her side. Vc to keep heart lifted.  -Bil hip lifts 3 directions with ankle fins 20x each -Pink bells core work: static stance arms forward and back 2x20 VC to increase speed -Standing against wall core/lat press with 2 rainbow floats 3x10 -Horseback bicycle on yellow 7 min, ankle fins    HOME EXERCISE PROGRAM:  Access Code: [email or web address]- previously provided  Access Code: [email or web address] URL: [email or web address] Date: 09/19/2023 Prepared by: Burnard  Exercises - Seated Cervical Flexion AROM  - 3 x daily - 7 x weekly - 1 sets - 3 reps - 20 hold - Seated Cervical Sidebending AROM  - 3 x daily - 7 x weekly - 1 sets - 3 reps - 20 hold - Seated Cervical Rotation AROM  - 3 x daily - 7 x weekly - 1 sets - 3 reps - 20 hold - Seated Correct Posture  - 1 x daily - 7 x weekly - 3 sets - 10 reps - Seated Diaphragmatic Breathing  - 5 x daily - 7 x weekly - 1 sets - 10 reps - Sidelying Open Book Thoracic Rotation with Knee on Foam Roll  - 2 x daily - 7 x weekly - 3 sets - 10 reps - Seated Transversus Abdominis Bracing  - 5 x daily - 7 x weekly - 1 sets - 10 reps - 5 hold - Seated Hamstring Stretch  - 3 x daily - 7 x weekly - 1 sets - 3 reps - 20 hold ASSESSMENT:  CLINICAL IMPRESSION: Moving in the helped mobilize her scapula and abolish her pain. Pt is absolutely independent in her aquatic routine. She may be unable to go to a facility to continue, but her land exercises are going very well so she is happy about that.    OBJECTIVE IMPAIRMENTS decreased activity tolerance, decreased  endurance, difficulty walking, decreased strength, increased muscle spasms, impaired flexibility, improper body mechanics, postural dysfunction, and pain.   ACTIVITY LIMITATIONS carrying, lifting, sitting, standing, reach over head, hygiene/grooming, and locomotion level  PARTICIPATION LIMITATIONS: meal prep, cleaning, laundry, driving, community activity, and yard work  PERSONAL FACTORS Past/current experiences, Time since onset of injury/illness/exacerbation, and 3+ comorbidities: fibromyalgia depression, anxiety,  are also affecting patient's functional outcome.   REHAB POTENTIAL: Good  CLINICAL DECISION MAKING: Evolving/moderate complexity  EVALUATION COMPLEXITY: Moderate   GOALS: Goals reviewed with patient? Yes  SHORT TERM GOALS: Target date: 10/17/2023    Return to regular performance of HEP 4-5x/week to improve mobility  Baseline: 3-5x/wk (met) Goal status: MET  2.  Rate housework > or = to 4/10 on patient specific functional scale (PSFS) Baseline: 5/10 Goal status: MET  3.  Verbalize and demonstrate abdominal massage daily to reduce GI pain Baseline:  Goal status: Met 8/11    LONG TERM GOALS: Target date: 02/04/24    Be independent in advanced HEP Baseline:  Goal status:  Ongoing  2.  Rate squatting for yard work > or = to 4/10 on PSFS Baseline: 4/10 (01/16/24) Goal status:MET  3.  Report > or = to 70% reduction in neck and thoracic pain with daily tasks  Baseline: 55% (01/16/24) Goal status: in progress   4.  Walk for exercise and perform regular exercises 3-5x/wk for exercise to improve endurance  Baseline: at least 3x/wk (01/16/24) Goal status: in progress   5.  Rate housework > or = to 6/10 on PSFS  Baseline: 5-6/10  (01/16/24) Goal status: in progress     PLAN: PT FREQUENCY: 1-2x/week  PT DURATION: 12 weeks  PLANNED INTERVENTIONS: 97164- PT Re-evaluation, 97110-Therapeutic exercises, 97530- Therapeutic activity, 97112- Neuromuscular  re-education, 97535- Self Care, [id number]- Manual therapy, [phone]- Gait training, [phone]- Aquatic Therapy, [phone]- Electrical stimulation (unattended), [phone]- Electrical stimulation (manual), [id number]- Traction (mechanical), 20560 (1-2 muscles), 20561 (3+ muscles)- Dry Needling, Patient/Family education, Balance training, Taping, Joint mobilization, Spinal manipulation, Spinal mobilization, Cryotherapy, Moist heat, Therapeutic exercises, Therapeutic activity, Neuromuscular re-education, and Self Care  PLAN FOR NEXT SESSION:: probable DC from aquatic, pt will have ERO next land visit  Delon Darner, PTA 01/23/24 12:23 PM
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
## 2024-01-29 ENCOUNTER — Ambulatory Visit: Admitting: Psychology

## 2024-01-29 DIAGNOSIS — F4310 Post-traumatic stress disorder, unspecified: Secondary | ICD-10-CM

## 2024-01-29 DIAGNOSIS — F422 Mixed obsessional thoughts and acts: Secondary | ICD-10-CM

## 2024-01-29 DIAGNOSIS — F902 Attention-deficit hyperactivity disorder, combined type: Secondary | ICD-10-CM

## 2024-01-30 ENCOUNTER — Ambulatory Visit: Attending: Family Medicine

## 2024-01-30 DIAGNOSIS — R252 Cramp and spasm: Secondary | ICD-10-CM | POA: Insufficient documentation

## 2024-01-30 DIAGNOSIS — M6281 Muscle weakness (generalized): Secondary | ICD-10-CM | POA: Insufficient documentation

## 2024-01-30 DIAGNOSIS — R293 Abnormal posture: Secondary | ICD-10-CM | POA: Diagnosis present

## 2024-01-30 DIAGNOSIS — M542 Cervicalgia: Secondary | ICD-10-CM | POA: Diagnosis present

## 2024-01-30 DIAGNOSIS — M353 Polymyalgia rheumatica: Secondary | ICD-10-CM | POA: Insufficient documentation

## 2024-01-30 NOTE — Therapy (Signed)
 OUTPATIENT PHYSICAL THERAPY TREATMENT     Patient Name: Danielle Bruce MRN: [id number] DOB:January 08, 1961, 63 y.o., female Today's Date: 01/30/2024      PT End of Session - 01/30/24 1137     Visit Number 20    Date for Recertification  04/23/24    Authorization Type Medicare-KX needed    Progress Note Due on Visit 28    PT Start Time 1104    PT Stop Time 1145    PT Time Calculation (min) 41 min    Activity Tolerance Patient tolerated treatment well    Behavior During Therapy [facility] for tasks assessed/performed                                 Past Medical History:  Diagnosis Date   Abdominal pain, unspecified site 10/18/2012   Anemia    Anxiety    Arthritis    osteoarthritis   ASCUS (atypical squamous cells of undetermined significance) on Pap smear 05/06/2005   NEG HIGH RISK HPV--C&B BIOPSY BENIGN 12/2005   Asthma    Bipolar 2 disorder (HCC)    Cancer (HCC)    skin cancer - basal cell   Colon polyps    Complication of anesthesia    anxious afterwards, will get headaches    Constipation    Depression    Fibromyalgia 10/2013   GERD (gastroesophageal reflux disease)    Hearing loss on left    Heart murmur    never had any problems   Hemorrhoids    High cholesterol    High risk HPV infection 08/2011   cytology negative   IBS (irritable bowel syndrome)    Insomnia    Lymphocytic colitis    Lymphoma (HCC)    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   Bone marrow biopsy showes 8% plasma cells IgA Lambda   Migraines    Nausea alone 10/18/2012   Osteoarthritis    Osteopenia    Peripheral neuropathy    PTSD (post-traumatic stress disorder)    Past Surgical History:  Procedure Laterality Date   BONE MARROW BIOPSY Left 12/18/2013   Plasma cell dyscrasia 8% population of plasma cells   BREAST BIOPSY Right    benign stereo   CESAREAN SECTION  13,11,06   CHOLECYSTECTOMY N/A 10/16/2012   Procedure: LAPAROSCOPIC  CHOLECYSTECTOMY;  Surgeon: Debby LABOR. Cornett, MD;  Location: [facility];  Service: General;  Laterality: N/A;   COLONOSCOPY     numerous times   DILATION AND CURETTAGE OF UTERUS     ESOPHAGOGASTRODUODENOSCOPY     HEMORRHOID SURGERY  1993   x3   IUD REMOVAL  02/2015   Mirena   LAPAROSCOPIC LYSIS OF ADHESIONS N/A 10/16/2012   Procedure: LAPAROSCOPIC LYSIS OF ADHESIONS;  Surgeon: Debby A. Cornett, MD;  Location: [facility];  Service: General;  Laterality: N/A;   LAPAROSCOPY N/A 10/16/2012   Procedure: LAPAROSCOPY DIAGNOSTIC;  Surgeon: Debby LABOR. Cornett, MD;  Location: [facility];  Service: General;  Laterality: N/A;   PELVIC LAPAROSCOPY     RADIOLOGY WITH ANESTHESIA N/A 12/16/2015   Procedure: MRI OF BRAIN WITH AND WITHOUT CONTRAST;  Surgeon: Medication Radiologist, MD;  Location: [facility];  Service: Radiology;  Laterality: N/A;   SHOULDER SURGERY  2007/2008   SPINE SURGERY  2010   cervical   Patient Active Problem List   Diagnosis Date Noted   Sensorineural hearing loss, bilateral 11/19/2023   GAD (generalized  anxiety disorder) 12/26/2017   OCD (obsessive compulsive disorder) 12/26/2017   PTSD (post-traumatic stress disorder) 12/26/2017   DDD (degenerative disc disease), cervical 07/14/2016   Primary osteoarthritis of both feet 07/14/2016   Primary osteoarthritis of both hands 07/14/2016   Other fatigue 07/14/2016   History of IBS 07/14/2016   Osteopenia of multiple sites 07/14/2016   Fibromyalgia 01/13/2016   MGUS (monoclonal gammopathy of unknown significance) 12/23/2013   Chronic cholecystitis without calculus 10/18/2012   Abdominal pain 10/18/2012   Nausea alone 10/18/2012   Constipation 10/18/2012   Depression 10/18/2012   Anxiety    ASCUS (atypical squamous cells of undetermined significance) on Pap smear    IUD    Hemorrhoids 11/29/2010   Abdominal pain, left upper quadrant 11/29/2010    PCP:  Joen Gentry, MD  REFERRING PROVIDER: Joen Gentry, MD  REFERRING DIAG:  fibromyalgia  THERAPY DIAG:  Abnormal posture - Plan: PT plan of care cert/re-cert  Polymyalgia rheumatica - Plan: PT plan of care cert/re-cert  Cramp and spasm - Plan: PT plan of care cert/re-cert  Muscle weakness (generalized) - Plan: PT plan of care cert/re-cert  Cervicalgia - Plan: PT plan of care cert/re-cert  Rationale for Evaluation and Treatment Rehabilitation  ONSET DATE: chronic pain (fibromyalgia), bowel flare up 1.5 months ago  SUBJECTIVE:                                                                                                                                                                                                         SUBJECTIVE STATEMENT:  I'm at least 50% better since the start of care.  The dry needling really helped me last time almost immediately.     PERTINENT HISTORY:  Anxiety, bipolar disorder, depression, fibromyalgia, IBS, migraines, osteopenia, PTSD  PAIN: 01/30/24 Are you having pain: upper back is sore 3/10 Pain location:   Pain rating:  Pain description: irritating, annoying   Aggravating factors: doing too much, stress  Relieving factors: muscle relaxers, rest, stretching  PRECAUTIONS: Other: chronic pain syndrome and depression Other: slow progression with exercise due to chronic condition  WEIGHT BEARING RESTRICTIONS No  FALLS:  Has patient fallen in last 6 months? No  LIVING ENVIRONMENT: Lives with: lives with their family Lives in: House/apartment  OCCUPATION: on disability  PLOF: Independent with basic ADLs and Leisure: yardwork, walking  Pt cares for her 4 young grandchildren- difficulty with care including lifting and carrying  PATIENT GOALS be more active with less pain, exercise at the gym regularly, lift and carry grandchildren and laundry, improved ease with yardwork  and housework, resume exercise  OBJECTIVE:   DIAGNOSTIC FINDINGS: none recent   PATIENT SURVEYS:  The Patient-Specific Functional  Scale  Initial:  I am going to ask you to identify up to 3 important activities that you are unable to do or are having difficulty with as a result of this problem.  Today are there any activities that you are unable to do or having difficulty with because of this?  (Patient shown scale and patient rated each activity)  Follow up: When you first came in you had difficulty performing these activities.  Today do you still have difficulty?  Patient-Specific activity scoring scheme (Point to one number):  0 1 2 3 4 5 6 7 8 9  10 Unable                                                                                                          Able to perform To perform                                                                                                    activity at the same Activity         Level as before                                                                                                                       Injury or problem  Activity   Lifting laundry and groceries/housework                           initial:  3/10  11/14/23: 5/10 01/16/24 5-6/10  01/30/24: 6/10  2.    Yard work- up and down with squatting          Initial: 2/10  11/14/23: 3/10 01/16/24: 4/10  01/30/24: 4/10     COGNITION: Overall cognitive status: Within functional limits for tasks assessed  SENSATION: WFL  POSTURE: rounded shoulders and forward head  PALPATION: Diffuse palpable tenderness over bil neck, upper traps, thoracic, lumbar and gluteals with trigger points.    CERVICAL ROM:   Stiff at end range  and pain on Rt with Lt motions.  All WFLs  UPPER EXTREMITY ROM: UE A/ROM is limited by 20% into flexion and abduction with pain at end range.  Hip flexibility is limited by 25% in all directions with pain in all directions.  UPPER EXTREMITY MMT: UE: 4+/5, LE 4+/5 except hip flexors 4-/5  TODAY'S TREATMENT:  01/30/24:  Nustep level 5 for 10 min- PT monitored and discussed pt status Ball  roll outs 3 ways x10 each  Leg press: seat 5, 60# 2x10 bil, 40# single leg 2x10 Row: 25# 2x10 March with alternating shoulder press: 5# 2x10     01/23/24:Pt arrives for aquatic physical therapy. Treatment took place in 3.5-5.5 feet of water. Water temperature was 91 degres F. Pt entered the pool via stairs independently.  -50% depth water walking with UE push pull with yellow floats 10x each direction. VC to walk as fast as she could to create a good resistive current.Ankle fins used. - High knee march 6x holding yellow floats by her side. Vc to keep heart lifted.  -Bil hip lifts 3 directions with ankle fins 20x each -Pink bells core work: static stance arms forward and back 2x20 VC to increase speed -Standing against wall core/lat press with 2 rainbow floats 3x10 -Horseback bicycle on yellow 7 min, ankle fins   01/16/24:  Nustep level 5 for 10 min- PT monitored and discussed pt status Ball roll outs 3 ways x10 each  Leg press: seat 5, 60# 2x10 bil, 40# single leg 2x10 Row: 25# 2x10 Trigger Point Dry Needling  Subsequent Treatment: Instructions provided previously at initial dry needling treatment.   Patient Verbal Consent Given: Yes Education Handout Provided: Previously Provided Muscles Treated: Bil cervical and thoracic multifidi, bil upper traps, bil rhomboids Electrical Stimulation Performed: No Treatment Response/Outcome: Utilized skilled palpation to identify bony landmarks and trigger points.  Able to illicit twitch response and muscle elongation.  Soft tissue mobilization to muscles needled following DN to further promote tissue elongation and decreased pain.      HOME EXERCISE PROGRAM:  Access Code: [email or web address]- previously provided  Access Code: [email or web address] URL: [email or web address] Date: 09/19/2023 Prepared by: Burnard  Exercises - Seated Cervical Flexion AROM  - 3 x daily - 7 x weekly - 1 sets - 3 reps - 20 hold - Seated Cervical Sidebending AROM  - 3 x daily - 7 x weekly - 1 sets - 3 reps - 20 hold - Seated Cervical Rotation AROM  - 3 x daily - 7 x weekly - 1 sets - 3 reps - 20 hold - Seated Correct Posture  - 1 x daily -  7 x weekly - 3 sets - 10 reps - Seated Diaphragmatic Breathing  - 5 x daily - 7 x weekly - 1 sets - 10 reps - Sidelying Open Book Thoracic Rotation with Knee on Foam Roll  - 2 x daily - 7 x weekly - 3 sets - 10 reps - Seated Transversus Abdominis Bracing  - 5 x daily - 7 x weekly - 1 sets - 10 reps - 5 hold - Seated Hamstring Stretch  - 3 x daily - 7 x weekly - 1 sets - 3 reps - 20 hold ASSESSMENT:  CLINICAL IMPRESSION: Pt is making steady progress.  She reports that she has less pain overall and movement is improved, reports 55% better since the start of care.  Pt manages her pain well at home and benefits from manual therapy in the clinic as needed.  Pt is motivated and compliant.  Progress is slow due to chronicity of pain and mental health challenges.  PSFS has improved for both yard work and house work.  Patient will benefit from skilled PT to address the below impairments and improve overall function.    OBJECTIVE IMPAIRMENTS decreased activity tolerance, decreased endurance, difficulty walking, decreased strength, increased muscle spasms, impaired flexibility, improper body mechanics, postural dysfunction, and pain.   ACTIVITY LIMITATIONS carrying, lifting, sitting, standing, reach over head, hygiene/grooming, and locomotion level  PARTICIPATION LIMITATIONS: meal prep, cleaning, laundry, driving, community activity, and yard work  PERSONAL FACTORS Past/current experiences, Time since onset of injury/illness/exacerbation, and 3+ comorbidities: fibromyalgia depression, anxiety,  are also affecting patient's functional outcome.   REHAB POTENTIAL: Good  CLINICAL DECISION MAKING: Evolving/moderate complexity  EVALUATION COMPLEXITY: Moderate   GOALS: Goals reviewed with patient? Yes  SHORT TERM GOALS: Target date: 10/17/2023    Return to regular performance of HEP 4-5x/week to improve mobility  Baseline: 3-5x/wk (met) Goal status: MET  2.  Rate housework > or = to 4/10 on patient  specific functional scale (PSFS) Baseline: 5/10 Goal status: MET  3.  Verbalize and demonstrate abdominal massage daily to reduce GI pain Baseline:  Goal status: Met 8/11    LONG TERM GOALS: Target date: 04/23/2024   Be independent in advanced HEP Baseline:  Goal status:  Ongoing  2.  Rate squatting for yard work > or = to 4/10 on PSFS  Baseline: 4/10 (01/30/24) Goal status:MET  3.  Report > or = to 70% reduction in neck and thoracic pain with daily tasks  Baseline: 55% (01/30/24) Goal status: in progress   4.  Walk for exercise and perform regular exercises 3-5x/wk for exercise to improve endurance  Baseline: at least 3x/wk (01/16/24) Goal status: in progress   5.  Rate housework > or = to 7/10 on PSFS  Baseline: 6/10  (01/30/24) Goal status: revised     PLAN: PT FREQUENCY: 1-2x/week  PT DURATION: 12 weeks  PLANNED INTERVENTIONS: 97164- PT Re-evaluation, 97110-Therapeutic exercises, 97530- Therapeutic activity, 97112- Neuromuscular re-education, 97535- Self Care, [id number]- Manual therapy, [phone]- Gait training, [phone]- Aquatic Therapy, [phone]- Electrical stimulation (unattended), [phone]- Electrical stimulation (manual), [id number]- Traction (mechanical), 20560 (1-2 muscles), 20561 (3+ muscles)- Dry Needling, Patient/Family education, Balance training, Taping, Joint mobilization, Spinal manipulation, Spinal mobilization, Cryotherapy, Moist heat, Therapeutic exercises, Therapeutic activity, Neuromuscular re-education, and Self Care  PLAN FOR NEXT SESSION:: every other week to manage pain and advance exercises   Burnard Joy, PT 01/30/24 11:50 AM
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
## 2024-01-31 ENCOUNTER — Encounter: Payer: Self-pay | Admitting: Dermatology

## 2024-01-31 ENCOUNTER — Ambulatory Visit: Admitting: Dermatology

## 2024-01-31 VITALS — BP 121/91 | HR 82 | Temp 98.1°F

## 2024-01-31 DIAGNOSIS — D1801 Hemangioma of skin and subcutaneous tissue: Secondary | ICD-10-CM | POA: Diagnosis not present

## 2024-01-31 DIAGNOSIS — L578 Other skin changes due to chronic exposure to nonionizing radiation: Secondary | ICD-10-CM | POA: Diagnosis not present

## 2024-01-31 DIAGNOSIS — D229 Melanocytic nevi, unspecified: Secondary | ICD-10-CM

## 2024-01-31 DIAGNOSIS — Z1283 Encounter for screening for malignant neoplasm of skin: Secondary | ICD-10-CM

## 2024-01-31 DIAGNOSIS — W908XXA Exposure to other nonionizing radiation, initial encounter: Secondary | ICD-10-CM | POA: Diagnosis not present

## 2024-01-31 DIAGNOSIS — L821 Other seborrheic keratosis: Secondary | ICD-10-CM | POA: Diagnosis not present

## 2024-01-31 DIAGNOSIS — Z85828 Personal history of other malignant neoplasm of skin: Secondary | ICD-10-CM | POA: Diagnosis not present

## 2024-01-31 DIAGNOSIS — Z8572 Personal history of non-Hodgkin lymphomas: Secondary | ICD-10-CM

## 2024-01-31 DIAGNOSIS — L570 Actinic keratosis: Secondary | ICD-10-CM | POA: Diagnosis not present

## 2024-01-31 DIAGNOSIS — L814 Other melanin hyperpigmentation: Secondary | ICD-10-CM | POA: Diagnosis not present

## 2024-01-31 DIAGNOSIS — L988 Other specified disorders of the skin and subcutaneous tissue: Secondary | ICD-10-CM

## 2024-01-31 NOTE — Progress Notes (Signed)
 New Patient Visit   History of Present Illness Danielle Bruce is a 63 year old female with a history of basal cell carcinoma who presents for a full body skin check.  She has a history of multiple basal cell carcinomas, including one on her nose that required surgery and another on her lip. She is not currently worried about any specific spots but mentions two hard spots on her chest and one on her back, which are similar to previous lesions but not painful. No current pain or concern with these hard spots.  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 She had to discontinue several medications, including mental health medications, to identify the trigger. She has not had a recurrence recently and is not currently on any of the medications that were previously implicated.  She has a family history of skin issues, with her father having numerous procedures on his nose in his eighties, leading to disfigurement.  She has a history of PTSD and was previously misdiagnosed with other mental health conditions until a dermatologist helped identify the correct diagnosis. She is currently undergoing EMDR therapy for PTSD and reports improvement.  She notes a scaly spot on her skin, described as 'little sandpapery spots'. She has had similar spots frozen in the past and is familiar with the process.   The following portions of the chart were reviewed this encounter and updated as appropriate: medications, allergies, medical history  Review of Systems:  No other skin or systemic complaints except as noted in HPI or Assessment and Plan.  Objective  Well appearing patient in no apparent distress; mood and affect are within normal limits.  A full examination was performed including scalp, head, eyes, ears, nose, lips, neck, chest, axillae, abdomen, back, buttocks, bilateral upper extremities, bilateral  lower extremities, hands, feet, fingers, toes, fingernails, and toenails. All findings within normal limits unless otherwise noted below.   Relevant exam findings are noted in the Assessment and Plan.  Left Nasal Sidewall Erythematous thin papules/macules with gritty scale.   Assessment & Plan   LENTIGINES, SEBORRHEIC KERATOSES, HEMANGIOMAS - Benign normal skin lesions - Benign-appearing - Call for any changes  MELANOCYTIC NEVI - Tan-brown and/or pink-flesh-colored symmetric macules and papules - Benign appearing on exam today - Observation - Call clinic for new or changing moles - Recommend daily use of broad spectrum spf 30+ sunscreen to sun-exposed areas.   ACTINIC DAMAGE - Chronic condition, secondary to cumulative UV/sun exposure - diffuse scaly erythematous macules with underlying dyspigmentation - Recommend daily broad spectrum sunscreen SPF 30+ to sun-exposed areas, reapply every 2 hours as needed.  - Staying in the shade or wearing long sleeves, sun glasses (UVA+UVB protection) and wide brim hats (4-inch brim around the entire circumference of the hat) are also recommended for sun protection.  - Call for new or changing lesions.  SKIN CANCER SCREENING PERFORMED TODAY  HISTORY OF BASAL CELL CARCINOMA OF THE SKIN - No evidence of recurrence today - Recommend regular full body skin exams - Recommend daily broad spectrum sunscreen SPF 30+ to sun-exposed areas, reapply every 2 hours as needed.  - Call if any new or changing lesions are noted between office visits   History of cutaneous T-cell lymphoid infiltrate Previous episodes possibly medication-induced, no current lesions. - Requested records from [facility] for previous treatment details and diagnosis. - thought to be due to previous mental health medications that  she is not currently on - Advised to report any new skin lesions promptly.  AK (ACTINIC KERATOSIS) Left Nasal Sidewall Destruction of lesion - Left Nasal  Sidewall Complexity: simple   Destruction method: cryotherapy   Informed consent: discussed and consent obtained   Timeout:  patient name, date of birth, surgical site, and procedure verified Lesion destroyed using liquid nitrogen: Yes   Region frozen until ice ball extended beyond lesion: Yes   Outcome: patient tolerated procedure well with no complications   Post-procedure details: wound care instructions given    LENTIGINES   CHERRY ANGIOMA   SEBORRHEIC KERATOSES   ACTINIC SKIN DAMAGE   MULTIPLE BENIGN NEVI   HISTORY OF BASAL CELL CARCINOMA   CUTANEOUS LYMPHOID HYPERPLASIA    Return in about 6 months (around 07/31/2024) for TBSE please have sign for release of information from [facility] dermatology.  I, Doyce Pan, CMA, am acting as scribe for RUFUS CHRISTELLA HOLY, MD.   Documentation: I have reviewed the above documentation for accuracy and completeness, and I agree with the above.  RUFUS CHRISTELLA HOLY, MD

## 2024-01-31 NOTE — Patient Instructions (Addendum)
 For areas treated with Liquid Nitrogen:  Keep clean with soap and water.  Apply Vaseline or Aquaphor twice daily.     Important Information  Due to recent changes in healthcare laws, you may see results of your pathology and/or laboratory studies on MyChart before the doctors have had a chance to review them. We understand that in some cases there may be results that are confusing or concerning to you. Please understand that not all results are received at the same time and often the doctors may need to interpret multiple results in order to provide you with the best plan of care or course of treatment. Therefore, we ask that you please give us  2 business days to thoroughly review all your results before contacting the office for clarification. Should we see a critical lab result, you will be contacted sooner.   If You Need Anything After Your Visit  If you have any questions or concerns for your doctor, please call our main line at [phone] If no one answers, please leave a voicemail as directed and we will return your call as soon as possible. Messages left after 4 pm will be answered the following business day.   You may also send us  a message via MyChart. We typically respond to MyChart messages within 1-2 business days.  For prescription refills, please ask your pharmacy to contact our office. Our fax number is [phone].  If you have an urgent issue when the clinic is closed that cannot wait until the next business day, you can page your doctor at the number below.    Please note that while we do our best to be available for urgent issues outside of office hours, we are not available 24/7.   If you have an urgent issue and are unable to reach us , you may choose to seek medical care at your doctor's office, retail clinic, urgent care center, or emergency room.  If you have a medical emergency, please immediately call 911 or go to the emergency department. In the event of  inclement weather, please call our main line at [phone] for an update on the status of any delays or closures.  Dermatology Medication Tips: Please keep the boxes that topical medications come in in order to help keep track of the instructions about where and how to use these. Pharmacies typically print the medication instructions only on the boxes and not directly on the medication tubes.   If your medication is too expensive, please contact our office at [phone] or send us  a message through MyChart.   We are unable to tell what your co-pay for medications will be in advance as this is different depending on your insurance coverage. However, we may be able to find a substitute medication at lower cost or fill out paperwork to get insurance to cover a needed medication.   If a prior authorization is required to get your medication covered by your insurance company, please allow us  1-2 business days to complete this process.  Drug prices often vary depending on where the prescription is filled and some pharmacies may offer cheaper prices.  The website www.goodrx.com contains coupons for medications through different pharmacies. The prices here do not account for what the cost may be with help from insurance (it may be cheaper with your insurance), but the website can give you the price if you did not use any insurance.  - You can print the associated coupon and take it with your prescription to the  pharmacy.  - You may also stop by our office during regular business hours and pick up a GoodRx coupon card.  - If you need your prescription sent electronically to a different pharmacy, notify our office through [facility] or by phone at [phone]

## 2024-02-05 ENCOUNTER — Other Ambulatory Visit: Payer: Self-pay | Admitting: Psychiatry

## 2024-02-05 ENCOUNTER — Ambulatory Visit: Admitting: Psychology

## 2024-02-05 DIAGNOSIS — F4001 Agoraphobia with panic disorder: Secondary | ICD-10-CM

## 2024-02-05 DIAGNOSIS — F4310 Post-traumatic stress disorder, unspecified: Secondary | ICD-10-CM

## 2024-02-05 DIAGNOSIS — F411 Generalized anxiety disorder: Secondary | ICD-10-CM

## 2024-02-05 DIAGNOSIS — F422 Mixed obsessional thoughts and acts: Secondary | ICD-10-CM

## 2024-02-05 DIAGNOSIS — F331 Major depressive disorder, recurrent, moderate: Secondary | ICD-10-CM
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
## 2024-02-11 DIAGNOSIS — Z8719 Personal history of other diseases of the digestive system: Secondary | ICD-10-CM | POA: Diagnosis not present

## 2024-02-12 ENCOUNTER — Ambulatory Visit: Admitting: Psychology

## 2024-02-12 DIAGNOSIS — F331 Major depressive disorder, recurrent, moderate: Secondary | ICD-10-CM

## 2024-02-12 DIAGNOSIS — F4310 Post-traumatic stress disorder, unspecified: Secondary | ICD-10-CM

## 2024-02-12 DIAGNOSIS — F411 Generalized anxiety disorder: Secondary | ICD-10-CM | POA: Diagnosis not present

## 2024-02-12 DIAGNOSIS — F422 Mixed obsessional thoughts and acts: Secondary | ICD-10-CM | POA: Diagnosis not present

## 2024-02-13 ENCOUNTER — Ambulatory Visit

## 2024-02-13 DIAGNOSIS — M542 Cervicalgia: Secondary | ICD-10-CM

## 2024-02-13 DIAGNOSIS — M353 Polymyalgia rheumatica: Secondary | ICD-10-CM

## 2024-02-13 DIAGNOSIS — R252 Cramp and spasm: Secondary | ICD-10-CM

## 2024-02-13 DIAGNOSIS — R293 Abnormal posture: Secondary | ICD-10-CM | POA: Diagnosis not present

## 2024-02-13 DIAGNOSIS — M6281 Muscle weakness (generalized): Secondary | ICD-10-CM

## 2024-02-13 NOTE — Therapy (Signed)
 OUTPATIENT PHYSICAL THERAPY TREATMENT     Patient Name: Danielle Bruce MRN: [id number] DOB:May 11, 1960, 63 y.o., female Today's Date: 02/13/2024      PT End of Session - 02/13/24 1229     Visit Number 21    Date for Recertification  04/23/24    Authorization Type Medicare-KX needed    Progress Note Due on Visit 28    PT Start Time 1147    PT Stop Time 1227    PT Time Calculation (min) 40 min    Activity Tolerance Patient tolerated treatment well    Behavior During Therapy [facility] for tasks assessed/performed                                  Past Medical History:  Diagnosis Date   Abdominal pain, unspecified site 10/18/2012   Anemia    Anxiety    Arthritis    osteoarthritis   ASCUS (atypical squamous cells of undetermined significance) on Pap smear 05/06/2005   NEG HIGH RISK HPV--C&B BIOPSY BENIGN 12/2005   Asthma    Basal cell carcinoma    Bipolar 2 disorder (HCC)    Cancer (HCC)    skin cancer - basal cell   Colon polyps    Complication of anesthesia    anxious afterwards, will get headaches    Constipation    Depression    Fibromyalgia 10/2013   GERD (gastroesophageal reflux disease)    Hearing loss on left    Heart murmur    never had any problems   Hemorrhoids    High cholesterol    High risk HPV infection 08/2011   cytology negative   IBS (irritable bowel syndrome)    Insomnia    Lymphocytic colitis    Lymphoma (HCC)    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   Bone marrow biopsy showes 8% plasma cells IgA Lambda   Migraines    Nausea alone 10/18/2012   Osteoarthritis    Osteopenia    Peripheral neuropathy    PTSD (post-traumatic stress disorder)    Past Surgical History:  Procedure Laterality Date   BONE MARROW BIOPSY Left 12/18/2013   Plasma cell dyscrasia 8% population of plasma cells   BREAST BIOPSY Right    benign stereo   CESAREAN SECTION  13,11,06   CHOLECYSTECTOMY N/A 10/16/2012    Procedure: LAPAROSCOPIC CHOLECYSTECTOMY;  Surgeon: Debby LABOR. Cornett, MD;  Location: [facility];  Service: General;  Laterality: N/A;   COLONOSCOPY     numerous times   DILATION AND CURETTAGE OF UTERUS     ESOPHAGOGASTRODUODENOSCOPY     HEMORRHOID SURGERY  1993   x3   IUD REMOVAL  02/2015   Mirena   LAPAROSCOPIC LYSIS OF ADHESIONS N/A 10/16/2012   Procedure: LAPAROSCOPIC LYSIS OF ADHESIONS;  Surgeon: Debby A. Cornett, MD;  Location: [facility];  Service: General;  Laterality: N/A;   LAPAROSCOPY N/A 10/16/2012   Procedure: LAPAROSCOPY DIAGNOSTIC;  Surgeon: Debby LABOR. Cornett, MD;  Location: [facility];  Service: General;  Laterality: N/A;   PELVIC LAPAROSCOPY     RADIOLOGY WITH ANESTHESIA N/A 12/16/2015   Procedure: MRI OF BRAIN WITH AND WITHOUT CONTRAST;  Surgeon: Medication Radiologist, MD;  Location: [facility];  Service: Radiology;  Laterality: N/A;   SHOULDER SURGERY  2007/2008   SPINE SURGERY  2010   cervical   Patient Active Problem List   Diagnosis Date Noted   Sensorineural hearing  loss, bilateral 11/19/2023   GAD (generalized anxiety disorder) 12/26/2017   OCD (obsessive compulsive disorder) 12/26/2017   PTSD (post-traumatic stress disorder) 12/26/2017   DDD (degenerative disc disease), cervical 07/14/2016   Primary osteoarthritis of both feet 07/14/2016   Primary osteoarthritis of both hands 07/14/2016   Other fatigue 07/14/2016   History of IBS 07/14/2016   Osteopenia of multiple sites 07/14/2016   Fibromyalgia 01/13/2016   MGUS (monoclonal gammopathy of unknown significance) 12/23/2013   Chronic cholecystitis without calculus 10/18/2012   Abdominal pain 10/18/2012   Nausea alone 10/18/2012   Constipation 10/18/2012   Depression 10/18/2012   Anxiety    ASCUS (atypical squamous cells of undetermined significance) on Pap smear    IUD    Hemorrhoids 11/29/2010   Abdominal pain, left upper quadrant 11/29/2010    PCP:  Joen Gentry, MD  REFERRING PROVIDER: Joen Gentry,  MD  REFERRING DIAG: fibromyalgia  THERAPY DIAG:  Abnormal posture  Polymyalgia rheumatica  Cramp and spasm  Muscle weakness (generalized)  Cervicalgia  Rationale for Evaluation and Treatment Rehabilitation  ONSET DATE: chronic pain (fibromyalgia), bowel flare up 1.5 months ago  SUBJECTIVE:                                                                                                                                                                                                         SUBJECTIVE STATEMENT:  I'm at least 50% better since the start of care.  I've had migraines recently and this has made me less mobile.  I try to exercise on the days that I can.    PERTINENT HISTORY:  Anxiety, bipolar disorder, depression, fibromyalgia, IBS, migraines, osteopenia, PTSD  PAIN: 02/13/24 Are you having pain: upper back is sore 4/10 Pain location:   Pain rating:  Pain description: irritating, annoying   Aggravating factors: doing too much, stress  Relieving factors: muscle relaxers, rest, stretching  PRECAUTIONS: Other: chronic pain syndrome and depression Other: slow progression with exercise due to chronic condition  WEIGHT BEARING RESTRICTIONS No  FALLS:  Has patient fallen in last 6 months? No  LIVING ENVIRONMENT: Lives with: lives with their family Lives in: House/apartment  OCCUPATION: on disability  PLOF: Independent with basic ADLs and Leisure: yardwork, walking  Pt cares for her 4 young grandchildren- difficulty with care including lifting and carrying  PATIENT GOALS be more active with less pain, exercise at the gym regularly, lift and carry grandchildren and laundry, improved ease with yardwork and housework, resume exercise  OBJECTIVE:   DIAGNOSTIC FINDINGS: none recent   PATIENT SURVEYS:  The Patient-Specific Functional Scale  Initial:  I am going to ask you to identify up to 3 important activities that you are unable to do or are having  difficulty with as a result of this problem.  Today are there any activities that you are unable to do or having difficulty with because of this?  (Patient shown scale and patient rated each activity)  Follow up: When you first came in you had difficulty performing these activities.  Today do you still have difficulty?  Patient-Specific activity scoring scheme (Point to one number):  0 1 2 3 4 5 6 7 8 9  10 Unable                                                                                                          Able to perform To perform                                                                                                    activity at the same Activity         Level as before                                                                                                                       Injury or problem  Activity   Lifting laundry and groceries/housework                           initial:  3/10  11/14/23: 5/10 01/16/24 5-6/10  01/30/24: 6/10  2.    Yard work- up and down with squatting          Initial: 2/10  11/14/23: 3/10 01/16/24: 4/10  01/30/24: 4/10     COGNITION: Overall cognitive status: Within functional limits for tasks assessed  SENSATION: WFL  POSTURE: rounded shoulders and forward head  PALPATION: Diffuse palpable tenderness over bil neck, upper traps, thoracic, lumbar and gluteals with trigger points.    CERVICAL ROM:   Stiff at end range and pain on Rt with Lt motions.  All WFLs  UPPER EXTREMITY ROM: UE A/ROM is  limited by 20% into flexion and abduction with pain at end range.  Hip flexibility is limited by 25% in all directions with pain in all directions.  UPPER EXTREMITY MMT: UE: 4+/5, LE 4+/5 except hip flexors 4-/5  TODAY'S TREATMENT:  02/13/24:  Nustep level 5 for 10 min- PT monitored and discussed pt status Ball roll outs 3 ways x 5 each  Leg press: seat 5, 60# 2x10 bil, 40# single leg 2x10 Row: 25# 2x10 March with alternating shoulder press: 5# 2x10 Sit to stand: with 5# kettlebell 2x10 with press out Famer's carry: 10# kettlebell around building  1 lap each hand Seated hamstring and figure 4 stretch 2x20 seconds bil.    01/30/24:  Nustep level 5 for 10 min- PT monitored and discussed pt status Ball roll outs 3 ways x10 each  Leg press: seat 5, 60# 2x10 bil, 40# single leg 2x10 Row: 25# 2x10 March with alternating shoulder press: 5# 2x10     01/23/24:Pt arrives for aquatic physical therapy. Treatment took place in 3.5-5.5 feet of water. Water temperature was 91 degres F. Pt entered the pool via stairs independently.  -50% depth water walking with UE push pull with yellow floats 10x each direction. VC to walk as fast as she could to create a good resistive current.Ankle fins used. - High knee march 6x holding yellow floats by her side. Vc to keep heart lifted.  -Bil hip lifts 3 directions with ankle fins 20x each -Pink bells core work: static stance arms forward and back 2x20 VC to increase speed -Standing against wall core/lat press with 2 rainbow floats 3x10 -Horseback bicycle on yellow 7 min, ankle fins   HOME EXERCISE PROGRAM:  Access Code: [email or web address]- previously provided  Access Code: [email or web address] URL: [email or web address] Date: 09/19/2023 Prepared by: Burnard  Exercises - Seated Cervical Flexion AROM  - 3 x daily - 7 x weekly - 1 sets - 3 reps - 20 hold - Seated Cervical Sidebending AROM  - 3 x daily - 7 x weekly - 1 sets - 3 reps - 20 hold - Seated Cervical Rotation AROM  - 3 x daily - 7 x weekly - 1 sets - 3 reps - 20 hold - Seated Correct Posture  - 1 x daily - 7 x weekly - 3 sets - 10 reps - Seated Diaphragmatic Breathing  - 5 x daily - 7 x weekly - 1 sets - 10 reps - Sidelying Open Book Thoracic Rotation with Knee on Foam Roll  - 2 x daily - 7 x weekly - 3 sets - 10 reps - Seated Transversus Abdominis Bracing  - 5 x daily - 7 x weekly - 1 sets - 10 reps - 5 hold - Seated Hamstring Stretch  - 3 x daily - 7 x weekly - 1 sets - 3 reps - 20 hold ASSESSMENT:  CLINICAL IMPRESSION: Pt is making steady progress with PT.  She reports that she has less pain  overall and movement is improved with her daily tasks, still reporting 55% better since the start of care.  She has been intermittently less active at home due to recent migraines.  Pt is motivated and compliant.  Progress is slow due to chronicity of pain and mental health challenges.  Pt was able to complete yardwork tasks recently without limitation or pain.   Patient will benefit from skilled PT to address the below impairments and improve overall function.    OBJECTIVE IMPAIRMENTS decreased activity tolerance, decreased endurance, difficulty walking, decreased strength, increased muscle spasms, impaired flexibility, improper body mechanics, postural dysfunction, and pain.   ACTIVITY LIMITATIONS carrying, lifting, sitting, standing, reach over head, hygiene/grooming, and locomotion level  PARTICIPATION LIMITATIONS: meal prep, cleaning, laundry, driving, community activity, and yard work  PERSONAL FACTORS Past/current experiences, Time since onset of injury/illness/exacerbation, and 3+ comorbidities: fibromyalgia depression, anxiety,  are also affecting patient's functional outcome.   REHAB POTENTIAL: Good  CLINICAL DECISION MAKING: Evolving/moderate complexity  EVALUATION COMPLEXITY: Moderate   GOALS: Goals reviewed with patient? Yes  SHORT TERM GOALS: Target date: 10/17/2023    Return to regular performance of HEP 4-5x/week to improve mobility  Baseline: 3-5x/wk (met) Goal status: MET  2.  Rate housework > or = to 4/10 on patient specific functional scale (PSFS) Baseline: 5/10 Goal status: MET  3.  Verbalize and demonstrate abdominal  massage daily to reduce GI pain Baseline:  Goal status: Met 8/11    LONG TERM GOALS: Target date: 04/23/2024   Be independent in advanced HEP Baseline:  Goal status:  Ongoing  2.  Rate squatting for yard work > or = to 4/10 on PSFS Baseline: 4/10 (01/30/24) Goal status:MET  3.  Report > or = to 70% reduction in neck and thoracic pain  with daily tasks  Baseline: 55% (01/30/24) Goal status: in progress   4.  Walk for exercise and perform regular exercises 3-5x/wk for exercise to improve endurance  Baseline: at least 3x/wk (01/16/24) Goal status: in progress   5.  Rate housework > or = to 7/10 on PSFS  Baseline: 6/10  (01/30/24) Goal status: revised     PLAN: PT FREQUENCY: 1-2x/week  PT DURATION: 12 weeks  PLANNED INTERVENTIONS: 97164- PT Re-evaluation, 97110-Therapeutic exercises, 97530- Therapeutic activity, 97112- Neuromuscular re-education, 97535- Self Care, [id number]- Manual therapy, [phone]- Gait training, [phone]- Aquatic Therapy, [phone]- Electrical stimulation (unattended), [phone]- Electrical stimulation (manual), [id number]- Traction (mechanical), 20560 (1-2 muscles), 20561 (3+ muscles)- Dry Needling, Patient/Family education, Balance training, Taping, Joint mobilization, Spinal manipulation, Spinal mobilization, Cryotherapy, Moist heat, Therapeutic exercises, Therapeutic activity, Neuromuscular re-education, and Self Care  PLAN FOR NEXT SESSION:: every other week to manage pain and advance exercises   Burnard Joy, PT 02/13/2024 12:30 PM
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
## 2024-02-19 ENCOUNTER — Ambulatory Visit: Admitting: Psychology

## 2024-02-19 DIAGNOSIS — F422 Mixed obsessional thoughts and acts: Secondary | ICD-10-CM

## 2024-02-19 DIAGNOSIS — F4310 Post-traumatic stress disorder, unspecified: Secondary | ICD-10-CM

## 2024-02-19 DIAGNOSIS — F411 Generalized anxiety disorder: Secondary | ICD-10-CM | POA: Diagnosis not present

## 2024-02-19 DIAGNOSIS — F331 Major depressive disorder, recurrent, moderate: Secondary | ICD-10-CM | POA: Diagnosis not present
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
## 2024-02-26 ENCOUNTER — Ambulatory Visit: Admitting: Psychology

## 2024-02-26 DIAGNOSIS — F411 Generalized anxiety disorder: Secondary | ICD-10-CM | POA: Diagnosis not present

## 2024-02-26 DIAGNOSIS — F4310 Post-traumatic stress disorder, unspecified: Secondary | ICD-10-CM | POA: Diagnosis not present

## 2024-02-26 DIAGNOSIS — F331 Major depressive disorder, recurrent, moderate: Secondary | ICD-10-CM | POA: Diagnosis not present

## 2024-02-26 DIAGNOSIS — F422 Mixed obsessional thoughts and acts: Secondary | ICD-10-CM | POA: Diagnosis not present
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
## 2024-03-04 ENCOUNTER — Ambulatory Visit: Admitting: Psychology

## 2024-03-05 ENCOUNTER — Ambulatory Visit

## 2024-03-11 ENCOUNTER — Ambulatory Visit: Admitting: Psychology

## 2024-03-11 DIAGNOSIS — F4310 Post-traumatic stress disorder, unspecified: Secondary | ICD-10-CM | POA: Diagnosis not present

## 2024-03-11 DIAGNOSIS — F422 Mixed obsessional thoughts and acts: Secondary | ICD-10-CM

## 2024-03-11 DIAGNOSIS — F411 Generalized anxiety disorder: Secondary | ICD-10-CM | POA: Diagnosis not present

## 2024-03-12 ENCOUNTER — Ambulatory Visit: Attending: Family Medicine

## 2024-03-12 DIAGNOSIS — M6281 Muscle weakness (generalized): Secondary | ICD-10-CM | POA: Insufficient documentation

## 2024-03-12 DIAGNOSIS — M353 Polymyalgia rheumatica: Secondary | ICD-10-CM | POA: Insufficient documentation

## 2024-03-12 DIAGNOSIS — R252 Cramp and spasm: Secondary | ICD-10-CM | POA: Insufficient documentation

## 2024-03-12 DIAGNOSIS — R293 Abnormal posture: Secondary | ICD-10-CM | POA: Insufficient documentation

## 2024-03-12 DIAGNOSIS — M542 Cervicalgia: Secondary | ICD-10-CM | POA: Insufficient documentation
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
## 2024-03-12 NOTE — Therapy (Signed)
 " OUTPATIENT PHYSICAL THERAPY TREATMENT     Patient Name: Danielle Bruce MRN: [id number] DOB:08-06-1960, 64 y.o., female Today's Date: 03/12/2024      PT End of Session - 03/12/24 1232     Visit Number 22    Date for Recertification  04/23/24    Authorization Type Medicare-KX needed (1/15)    Progress Note Due on Visit 28    PT Start Time 1153    PT Stop Time 1232    PT Time Calculation (min) 39 min    Activity Tolerance Patient tolerated treatment well    Behavior During Therapy [facility] for tasks assessed/performed                                   Past Medical History:  Diagnosis Date   Abdominal pain, unspecified site 10/18/2012   Anemia    Anxiety    Arthritis    osteoarthritis   ASCUS (atypical squamous cells of undetermined significance) on Pap smear 05/06/2005   NEG HIGH RISK HPV--C&B BIOPSY BENIGN 12/2005   Asthma    Basal cell carcinoma    Bipolar 2 disorder (HCC)    Cancer (HCC)    skin cancer - basal cell   Colon polyps    Complication of anesthesia    anxious afterwards, will get headaches    Constipation    Depression    Fibromyalgia 10/2013   GERD (gastroesophageal reflux disease)    Hearing loss on left    Heart murmur    never had any problems   Hemorrhoids    High cholesterol    High risk HPV infection 08/2011   cytology negative   IBS (irritable bowel syndrome)    Insomnia    Lymphocytic colitis    Lymphoma (HCC)    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   Bone marrow biopsy showes 8% plasma cells IgA Lambda   Migraines    Nausea alone 10/18/2012   Osteoarthritis    Osteopenia    Peripheral neuropathy    PTSD (post-traumatic stress disorder)    Past Surgical History:  Procedure Laterality Date   BONE MARROW BIOPSY Left 12/18/2013   Plasma cell dyscrasia 8% population of plasma cells   BREAST BIOPSY Right    benign stereo   CESAREAN SECTION  13,11,06   CHOLECYSTECTOMY N/A 10/16/2012    Procedure: LAPAROSCOPIC CHOLECYSTECTOMY;  Surgeon: Debby LABOR. Cornett, MD;  Location: [facility];  Service: General;  Laterality: N/A;   COLONOSCOPY     numerous times   DILATION AND CURETTAGE OF UTERUS     ESOPHAGOGASTRODUODENOSCOPY     HEMORRHOID SURGERY  1993   x3   IUD REMOVAL  02/2015   Mirena   LAPAROSCOPIC LYSIS OF ADHESIONS N/A 10/16/2012   Procedure: LAPAROSCOPIC LYSIS OF ADHESIONS;  Surgeon: Debby A. Cornett, MD;  Location: [facility];  Service: General;  Laterality: N/A;   LAPAROSCOPY N/A 10/16/2012   Procedure: LAPAROSCOPY DIAGNOSTIC;  Surgeon: Debby LABOR. Cornett, MD;  Location: [facility];  Service: General;  Laterality: N/A;   PELVIC LAPAROSCOPY     RADIOLOGY WITH ANESTHESIA N/A 12/16/2015   Procedure: MRI OF BRAIN WITH AND WITHOUT CONTRAST;  Surgeon: Medication Radiologist, MD;  Location: [facility];  Service: Radiology;  Laterality: N/A;   SHOULDER SURGERY  2007/2008   SPINE SURGERY  2010   cervical   Patient Active Problem List   Diagnosis Date Noted  Sensorineural hearing loss, bilateral 11/19/2023   GAD (generalized anxiety disorder) 12/26/2017   OCD (obsessive compulsive disorder) 12/26/2017   PTSD (post-traumatic stress disorder) 12/26/2017   DDD (degenerative disc disease), cervical 07/14/2016   Primary osteoarthritis of both feet 07/14/2016   Primary osteoarthritis of both hands 07/14/2016   Other fatigue 07/14/2016   History of IBS 07/14/2016   Osteopenia of multiple sites 07/14/2016   Fibromyalgia 01/13/2016   MGUS (monoclonal gammopathy of unknown significance) 12/23/2013   Chronic cholecystitis without calculus 10/18/2012   Abdominal pain 10/18/2012   Nausea alone 10/18/2012   Constipation 10/18/2012   Depression 10/18/2012   Anxiety    ASCUS (atypical squamous cells of undetermined significance) on Pap smear    IUD    Hemorrhoids 11/29/2010   Abdominal pain, left upper quadrant 11/29/2010    PCP:  Joen Gentry, MD  REFERRING PROVIDER: Joen Gentry,  MD  REFERRING DIAG: fibromyalgia  THERAPY DIAG:  Abnormal posture  Polymyalgia rheumatica  Cramp and spasm  Muscle weakness (generalized)  Cervicalgia  Rationale for Evaluation and Treatment Rehabilitation  ONSET DATE: chronic pain (fibromyalgia), bowel flare up 1.5 months ago  SUBJECTIVE:                                                                                                                                                                                                         SUBJECTIVE STATEMENT:  I have been sick for 2 weeks.  My head and neck are stiff and sore due to being in the bed. I wasn't able to do much while sick but I have been able to return to some activity the past few days.      PERTINENT HISTORY:  Anxiety, bipolar disorder, depression, fibromyalgia, IBS, migraines, osteopenia, PTSD  PAIN: 02/13/24 Are you having pain: upper back is sore 4/10 Pain location:   Pain rating:  Pain description: irritating, annoying   Aggravating factors: doing too much, stress  Relieving factors: muscle relaxers, rest, stretching  PRECAUTIONS: Other: chronic pain syndrome and depression Other: slow progression with exercise due to chronic condition  WEIGHT BEARING RESTRICTIONS No  FALLS:  Has patient fallen in last 6 months? No  LIVING ENVIRONMENT: Lives with: lives with their family Lives in: House/apartment  OCCUPATION: on disability  PLOF: Independent with basic ADLs and Leisure: yardwork, walking  Pt cares for her 4 young grandchildren- difficulty with care including lifting and carrying  PATIENT GOALS be more active with less pain, exercise at the gym regularly, lift and carry grandchildren and laundry, improved ease with yardwork and housework,  resume exercise  OBJECTIVE:   DIAGNOSTIC FINDINGS: none recent   PATIENT SURVEYS:  The Patient-Specific Functional Scale  Initial:  I am going to ask you to identify up to 3 important activities that  you are unable to do or are having difficulty with as a result of this problem.  Today are there any activities that you are unable to do or having difficulty with because of this?  (Patient shown scale and patient rated each activity)  Follow up: When you first came in you had difficulty performing these activities.  Today do you still have difficulty?  Patient-Specific activity scoring scheme (Point to one number):  0 1 2 3 4 5 6 7 8 9  10 Unable                                                                                                          Able to perform To perform                                                                                                    activity at the same Activity         Level as before                                                                                                                       Injury or problem  Activity   Lifting laundry and groceries/housework                           initial:  3/10  11/14/23: 5/10 01/16/24 5-6/10  01/30/24: 6/10  2.    Yard work- up and down with squatting          Initial: 2/10  11/14/23: 3/10 01/16/24: 4/10  01/30/24: 4/10     COGNITION: Overall cognitive status: Within functional limits for tasks assessed  SENSATION: WFL  POSTURE: rounded shoulders and forward head  PALPATION: Diffuse palpable tenderness over bil neck, upper traps, thoracic, lumbar and gluteals with trigger points.    CERVICAL ROM:   Stiff at end range and pain  on Rt with Lt motions.  All WFLs  UPPER EXTREMITY ROM: UE A/ROM is limited by 20% into flexion and abduction with pain at end range.  Hip flexibility is limited by 25% in all directions with pain in all directions.  UPPER EXTREMITY MMT: UE: 4+/5, LE 4+/5 except hip flexors 4-/5  TODAY'S TREATMENT:  03/12/24:  Nustep level 5 for 10 min- PT monitored and discussed pt status Ball roll outs 3 ways x 5 each    Trigger Point Dry Needling  Subsequent Treatment:  Instructions provided previously at initial dry needling treatment.   Patient Verbal Consent Given: Yes Education Handout Provided: Previously Provided Muscles Treated: bil cervical paraspinals, upper traps, suboccipitals, rhomboids Electrical Stimulation Performed: No Treatment Response/Outcome: Utilized skilled palpation to identify bony landmarks and trigger points.  Able to illicit twitch response and muscle elongation.  Soft tissue mobilization to muscles needled following DN to further promote tissue elongation and decreased pain.        02/13/24:  Nustep level 5 for 10 min- PT monitored and discussed pt status Ball roll outs 3 ways x 5 each  Leg press: seat 5, 60# 2x10 bil, 40# single leg 2x10 Row: 25# 2x10 March with alternating shoulder press: 5# 2x10 Sit to stand: with 5# kettlebell 2x10 with press out Famer's carry: 10# kettlebell around building  1 lap each hand Seated hamstring and figure 4 stretch 2x20 seconds bil.    01/30/24:  Nustep level 5 for 10 min- PT monitored and discussed pt status Ball roll outs 3  ways x10 each  Leg press: seat 5, 60# 2x10 bil, 40# single leg 2x10 Row: 25# 2x10 March with alternating shoulder press: 5# 2x10     01/23/24:Pt arrives for aquatic physical therapy. Treatment took place in 3.5-5.5 feet of water. Water temperature was 91 degres F. Pt entered the pool via stairs independently.  -50% depth water walking with UE push pull with yellow floats 10x each direction. VC to walk as fast as she could to create a good resistive current.Ankle fins used. - High knee march 6x holding yellow floats by her side. Vc to keep heart lifted.  -Bil hip lifts 3 directions with ankle fins 20x each -Pink bells core work: static stance arms forward and back 2x20 VC to increase speed -Standing against wall core/lat press with 2 rainbow floats 3x10 -Horseback bicycle on yellow 7 min, ankle fins   HOME EXERCISE PROGRAM:  Access Code: [email or web address]- previously provided  Access Code: [email or web address] URL: [email or web address] Date: 09/19/2023 Prepared by: Burnard  Exercises - Seated Cervical Flexion AROM  - 3 x daily -  7 x weekly - 1 sets - 3 reps - 20 hold - Seated Cervical Sidebending AROM  - 3 x daily - 7 x weekly - 1 sets - 3 reps - 20 hold - Seated Cervical Rotation AROM  - 3 x daily - 7 x weekly - 1 sets - 3 reps - 20 hold - Seated Correct Posture  - 1 x daily - 7 x weekly - 3 sets - 10 reps - Seated Diaphragmatic Breathing  - 5 x daily - 7 x weekly - 1 sets - 10 reps - Sidelying Open Book Thoracic Rotation with Knee on Foam Roll  - 2 x daily - 7 x weekly - 3 sets - 10 reps - Seated Transversus Abdominis Bracing  - 5 x daily - 7 x weekly - 1 sets - 10 reps - 5 hold - Seated Hamstring Stretch  - 3 x daily - 7 x weekly - 1 sets - 3 reps - 20 hold ASSESSMENT:  CLINICAL IMPRESSION: Pt has been sick and in bed, leading to increased neck and thoracic stiffness and pain.  She was unable to exercise during her illness and has returned to some activity over the past few days.  Good response to dry needling with improved tissue mobility and twitch response to upper back and neck.  Pt is motivated and compliant.  Progress is slow due to chronicity of pain and mental health challenges.  Pt was able to complete yardwork tasks recently without limitation or pain.   Patient will benefit from skilled PT to address the below impairments and improve overall function.    OBJECTIVE IMPAIRMENTS decreased activity tolerance, decreased endurance, difficulty walking, decreased strength, increased muscle spasms, impaired flexibility, improper body mechanics, postural dysfunction, and pain.   ACTIVITY LIMITATIONS carrying, lifting, sitting, standing, reach over head, hygiene/grooming, and locomotion level  PARTICIPATION LIMITATIONS: meal prep, cleaning, laundry, driving, community activity, and yard work  PERSONAL FACTORS Past/current experiences, Time since onset of injury/illness/exacerbation, and 3+ comorbidities: fibromyalgia depression, anxiety,  are also affecting patient's functional outcome.   REHAB POTENTIAL:  Good  CLINICAL DECISION MAKING: Evolving/moderate complexity  EVALUATION COMPLEXITY: Moderate   GOALS: Goals reviewed with patient? Yes  SHORT TERM GOALS: Target date: 10/17/2023    Return to regular performance of HEP 4-5x/week to improve mobility  Baseline: 3-5x/wk (met) Goal status: MET  2.  Rate housework > or = to 4/10 on patient specific functional scale (PSFS) Baseline: 5/10 Goal  status: MET  3.  Verbalize and demonstrate abdominal massage daily to reduce GI pain Baseline:  Goal status: Met 8/11    LONG TERM GOALS: Target date: 04/23/2024   Be independent in advanced HEP Baseline:  Goal status:  Ongoing  2.  Rate squatting for yard work > or = to 4/10 on PSFS Baseline: 4/10 (01/30/24) Goal status:MET  3.  Report > or = to 70% reduction in neck and thoracic pain with daily tasks  Baseline: 55% (01/30/24) Goal status: in progress   4.  Walk for exercise and perform regular exercises 3-5x/wk for exercise to improve endurance  Baseline: at least 3x/wk (01/16/24) Goal status: in progress   5.  Rate housework > or = to 7/10 on PSFS  Baseline: 6/10  (01/30/24) Goal status: revised     PLAN: PT FREQUENCY: 1-2x/week  PT DURATION: 12 weeks  PLANNED INTERVENTIONS: 97164- PT Re-evaluation, 97110-Therapeutic exercises, 97530- Therapeutic activity, 97112- Neuromuscular re-education, 97535- Self Care, [id number]- Manual therapy, [phone]- Gait training, [phone]- Aquatic Therapy, [phone]- Electrical stimulation (unattended), [phone]- Electrical stimulation (manual), [id number]- Traction (mechanical), 20560 (1-2 muscles), 20561 (3+ muscles)- Dry Needling, Patient/Family education, Balance training, Taping, Joint mobilization, Spinal manipulation, Spinal mobilization, Cryotherapy, Moist heat, Therapeutic exercises, Therapeutic activity, Neuromuscular re-education, and Self Care  PLAN FOR NEXT SESSION:: every other week to manage pain and advance exercises   Burnard Joy, PT 03/12/2024 12:36  PM  "

## 2024-03-18 ENCOUNTER — Ambulatory Visit: Admitting: Psychology

## 2024-03-18 DIAGNOSIS — F422 Mixed obsessional thoughts and acts: Secondary | ICD-10-CM

## 2024-03-18 DIAGNOSIS — F4310 Post-traumatic stress disorder, unspecified: Secondary | ICD-10-CM

## 2024-03-18 DIAGNOSIS — F411 Generalized anxiety disorder: Secondary | ICD-10-CM

## 2024-03-19 ENCOUNTER — Ambulatory Visit

## 2024-03-19 DIAGNOSIS — R293 Abnormal posture: Secondary | ICD-10-CM

## 2024-03-19 DIAGNOSIS — M353 Polymyalgia rheumatica: Secondary | ICD-10-CM

## 2024-03-19 DIAGNOSIS — M542 Cervicalgia: Secondary | ICD-10-CM

## 2024-03-19 DIAGNOSIS — M6281 Muscle weakness (generalized): Secondary | ICD-10-CM

## 2024-03-19 DIAGNOSIS — R252 Cramp and spasm: Secondary | ICD-10-CM

## 2024-03-19 NOTE — Progress Notes (Signed)
 " [facility] Counselor/Therapist Progress Note  Patient ID: KIMBERLYE DILGER, MRN: [id number],    Date:03/18/2024  Time Spent: 60 minutes Time In: 3:00  Time out:4:00  Treatment Type: Individual Therapy  Reported Symptoms: flashbacks, responding to triggers, depression, anxiety, being busy all of the time, dissociation  Mental Status Exam: Appearance:  Casual     Behavior: Appropriate  Motor: normal  Speech/Language:  Clear and Coherent  Affect: Blunt  Mood: pleasant  Thought process: normal  Thought content:   WNL  Sensory/Perceptual disturbances:   WNL  Orientation: oriented to person, place, time/date, and situation  Attention: Good  Concentration: Good  Memory: WNL  Fund of knowledge:  Good  Insight:   Good  Judgment:  Fair  Impulse Control: Good   Risk Assessment: Danger to Self:  No Self-injurious Behavior: No Danger to Others: No Duty to Warn:no Physical Aggression / Violence:No  Access to Firearms a concern: No  Gang Involvement:No   Subjective: The patient attended a face-to-face individual therapy in the office today.  The patient presents as pleasant and cooperative.  The patient reports that they are still having family drama and she has not heard from her daughter.  We talked about her continuing to be supportive if she gets in touch with her but I do not know that she needs to take responsibility and apologize for anything that she is done because I think it is more her daughter that has the problem than the patient.  We talked a little more today about what we want to target with the EMDR and I think there is only a few more things that we need to address and we also talked about [place].  I think that some of it is just insecurity within herself and we will talk further about whether we need to do something else about her perception that she needs a big strong man to protect her and take care of her.  At  this point I am not sure what she wants to do about therapy after I retire and we did discuss it briefly but we will continue to discuss it during her next session. Interventions: Cognitive Behavioral Therapy, Mindfulness Meditation, Eye Movement Desensitization and Reprocessing (EMDR), Insight-Oriented, and Interpersonal, psychoeducation  Diagnosis:PTSD (post-traumatic stress disorder)  Generalized anxiety disorder  Mixed obsessional thoughts and acts  Plan: Plan of Care: Client Abilities/Strengths  Insightful, motivated, Supportive family Client Treatment Preferences  Outpatient Individual therapy/EMDR  Client Statement of Needs  I think I have PTSD and I feel like I need another kind of therapy than talk therapy Treatment Level  Outpatient Individual therapy  Symptoms  Demonstrates an exaggerated startle response.:  (Status: maintained). Depressed  or irritable mood.: (Status:improved). Describes a reliving of the event,  particularly through dissociative flashbacks.: (Status: maintained). Displays a  significant decline in interest and engagement in activities.:  (Status: improved). Displays significant psychological and/or physiological distress resulting from internal and external  clues that are reminiscent of the traumatic event.: (Status: maintained).  Experiences disturbances in sleep.:  (Status: improved). Experiences disturbing  and persistent thoughts, images, and/or perceptions of the traumatic event.:  (Status: improved). Experiences frequent nightmares.: (Status: improved).  Feelings of hopelessness, worthlessness, or inappropriate guilt.: No Description Entered (Status:  improved). Has been exposed to a traumatic event involving actual or perceived threat of death or  serious injury.:  (Status: maintained). Impairment in social, occupational, or  other areas of functioning.:(Status:improved).  Intentionally avoids activities,  places, people, or objects (e.g.,  up-armored vehicles) that evoke memories of the event.: (Status: maintained). Intentionally avoids thoughts, feelings, or discussions related  to the traumatic event.: (Status: maintained). Reports difficulty concentrating as  well as feelings of guilt (Status:maintained). Reports response of intense fear,  helplessness, or horror to the traumatic event.:  (Status: maintained).  Problems Addressed  Unipolar Depression, Posttraumatic Stress Disorder (PTSD), Posttraumatic Stress Disorder (PTSD),   Posttraumatic Stress Disorder (PTSD), Posttraumatic Stress Disorder (PTSD)  Goals 1. Develop healthy thinking patterns and beliefs about self, others, and the world that lead to the alleviation and help prevent the relapse of  depression. Objective Identify and replace thoughts and beliefs that support depression. Target Date: 2025/01/03 Frequency: Weekly Progress: 80 Modality: individual Related Interventions 1. Explore and restructure underlying assumptions and beliefs reflected in biased self-talk that  may put the client at risk for relapse or recurrence. 2. Conduct Cognitive-Behavioral Therapy (see Cognitive Behavior Therapy by Almarie; Overcoming Depression by Marine dunker al.), beginning with helping the client learn the connection among  cognition, depressive feelings, and actions. 2. Eliminate or reduce the negative impact trauma related symptoms have  on social, occupational, and family functioning. Objective Learn and implement personal skills to manage challenging situations related to trauma. Target Date: 2025/01/03 Frequency: Weekly Progress: 70 Modality: individual 3. No longer avoids persons, places, activities, and objects that are  reminiscent of the traumatic event. Objective Participate in Eye Movement Desensitization and Reprocessing (EMDR) to reduce emotional distress  related to traumatic thoughts, feelings, and images. Target Date: 2025/01/03 Frequency: Weekly Progress: 70  Modality: individual   Related Interventions 1. Utilize Eye Movement Desensitization and Reprocessing (EMDR) to reduce the client's  emotional reactivity to the traumatic event and reduce PTSD symptoms. Objective Learn and implement guided self-dialogue to manage thoughts, feelings, and urges brought on by  encounters with trauma-related situations. Target Date: 2026-11/07 Frequency: Weekly Progress: 70 Modality: individual Related Interventions 1. Teach the client a guided self-dialogue procedure in which he/she learns to recognize  maladaptive self-talk, challenges its biases, copes with engendered feelings, overcomes  avoidance, and reinforces his/her accomplishments; review and reinforce progress, problemsolve obstacles. 4. No longer experiences intrusive event recollections, avoidance of event  reminders, intense arousal, or disinterest in activities or  relationships. 5. Thinks about or openly discusses the traumatic event with others  without experiencing psychological or physiological distress. Diagnosis Axis  none F43.10 (Posttraumatic stress disorder) - Open -signifier: n/a posttraumatic Stress  Disorder  Conditions For Discharge Achievement of treatment goals and objectives   Jenette Rayson G Dorsie Burich, LCSW                                                                                                     "

## 2024-03-19 NOTE — Therapy (Signed)
 " OUTPATIENT PHYSICAL THERAPY TREATMENT     Patient Name: Danielle Bruce MRN: [id number] DOB:23-Sep-1960, 64 y.o., female Today's Date: 03/19/2024      PT End of Session - 03/19/24 1144     Visit Number 23    Date for Recertification  04/23/24    Authorization Type Medicare-KX needed (2/15)    Progress Note Due on Visit 28    PT Start Time 1101    PT Stop Time 1144    PT Time Calculation (min) 43 min    Activity Tolerance Patient tolerated treatment well    Behavior During Therapy [facility] for tasks assessed/performed                                    Past Medical History:  Diagnosis Date   Abdominal pain, unspecified site 10/18/2012   Anemia    Anxiety    Arthritis    osteoarthritis   ASCUS (atypical squamous cells of undetermined significance) on Pap smear 05/06/2005   NEG HIGH RISK HPV--C&B BIOPSY BENIGN 12/2005   Asthma    Basal cell carcinoma    Bipolar 2 disorder (HCC)    Cancer (HCC)    skin cancer - basal cell   Colon polyps    Complication of anesthesia    anxious afterwards, will get headaches    Constipation    Depression    Fibromyalgia 10/2013   GERD (gastroesophageal reflux disease)    Hearing loss on left    Heart murmur    never had any problems   Hemorrhoids    High cholesterol    High risk HPV infection 08/2011   cytology negative   IBS (irritable bowel syndrome)    Insomnia    Lymphocytic colitis    Lymphoma (HCC)    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   Bone marrow biopsy showes 8% plasma cells IgA Lambda   Migraines    Nausea alone 10/18/2012   Osteoarthritis    Osteopenia    Peripheral neuropathy    PTSD (post-traumatic stress disorder)    Past Surgical History:  Procedure Laterality Date   BONE MARROW BIOPSY Left 12/18/2013   Plasma cell dyscrasia 8% population of plasma cells   BREAST BIOPSY Right    benign stereo   CESAREAN SECTION  13,11,06   CHOLECYSTECTOMY N/A 10/16/2012    Procedure: LAPAROSCOPIC CHOLECYSTECTOMY;  Surgeon: Debby LABOR. Cornett, MD;  Location: [facility];  Service: General;  Laterality: N/A;   COLONOSCOPY     numerous times   DILATION AND CURETTAGE OF UTERUS     ESOPHAGOGASTRODUODENOSCOPY     HEMORRHOID SURGERY  1993   x3   IUD REMOVAL  02/2015   Mirena   LAPAROSCOPIC LYSIS OF ADHESIONS N/A 10/16/2012   Procedure: LAPAROSCOPIC LYSIS OF ADHESIONS;  Surgeon: Debby A. Cornett, MD;  Location: [facility];  Service: General;  Laterality: N/A;   LAPAROSCOPY N/A 10/16/2012   Procedure: LAPAROSCOPY DIAGNOSTIC;  Surgeon: Debby LABOR. Cornett, MD;  Location: [facility];  Service: General;  Laterality: N/A;   PELVIC LAPAROSCOPY     RADIOLOGY WITH ANESTHESIA N/A 12/16/2015   Procedure: MRI OF BRAIN WITH AND WITHOUT CONTRAST;  Surgeon: Medication Radiologist, MD;  Location: [facility];  Service: Radiology;  Laterality: N/A;   SHOULDER SURGERY  2007/2008   SPINE SURGERY  2010   cervical   Patient Active Problem List   Diagnosis Date Noted  Sensorineural hearing loss, bilateral 11/19/2023   GAD (generalized anxiety disorder) 12/26/2017   OCD (obsessive compulsive disorder) 12/26/2017   PTSD (post-traumatic stress disorder) 12/26/2017   DDD (degenerative disc disease), cervical 07/14/2016   Primary osteoarthritis of both feet 07/14/2016   Primary osteoarthritis of both hands 07/14/2016   Other fatigue 07/14/2016   History of IBS 07/14/2016   Osteopenia of multiple sites 07/14/2016   Fibromyalgia 01/13/2016   MGUS (monoclonal gammopathy of unknown significance) 12/23/2013   Chronic cholecystitis without calculus 10/18/2012   Abdominal pain 10/18/2012   Nausea alone 10/18/2012   Constipation 10/18/2012   Depression 10/18/2012   Anxiety    ASCUS (atypical squamous cells of undetermined significance) on Pap smear    IUD    Hemorrhoids 11/29/2010   Abdominal pain, left upper quadrant 11/29/2010    PCP:  Joen Gentry, MD  REFERRING PROVIDER: Joen Gentry,  MD  REFERRING DIAG: fibromyalgia  THERAPY DIAG:  Abnormal posture  Polymyalgia rheumatica  Cramp and spasm  Muscle weakness (generalized)  Cervicalgia  Rationale for Evaluation and Treatment Rehabilitation  ONSET DATE: chronic pain (fibromyalgia), bowel flare up 1.5 months ago  SUBJECTIVE:                                                                                                                                                                                                         SUBJECTIVE STATEMENT:  I felt good after dry needling.  I was sore for 24 hours and then so much better.      PERTINENT HISTORY:  Anxiety, bipolar disorder, depression, fibromyalgia, IBS, migraines, osteopenia, PTSD  PAIN: 03/19/24 Are you having pain: overall soreness 3/10 Pain location:   Pain rating:  Pain description: irritating, annoying   Aggravating factors: doing too much, stress  Relieving factors: muscle relaxers, rest, stretching  PRECAUTIONS: Other: chronic pain syndrome and depression Other: slow progression with exercise due to chronic condition  WEIGHT BEARING RESTRICTIONS No  FALLS:  Has patient fallen in last 6 months? No  LIVING ENVIRONMENT: Lives with: lives with their family Lives in: House/apartment  OCCUPATION: on disability  PLOF: Independent with basic ADLs and Leisure: yardwork, walking  Pt cares for her 4 young grandchildren- difficulty with care including lifting and carrying  PATIENT GOALS be more active with less pain, exercise at the gym regularly, lift and carry grandchildren and laundry, improved ease with yardwork and housework, resume exercise  OBJECTIVE:   DIAGNOSTIC FINDINGS: none recent   PATIENT SURVEYS:  The Patient-Specific Functional Scale  Initial:  I am going to ask you  to identify up to 3 important activities that you are unable to do or are having difficulty with as a result of this problem.  Today are there any activities that  you are unable to do or having difficulty with because of this?  (Patient shown scale and patient rated each activity)  Follow up: When you first came in you had difficulty performing these activities.  Today do you still have difficulty?  Patient-Specific activity scoring scheme (Point to one number):  0 1 2 3 4 5 6 7 8 9  10 Unable                                                                                                          Able to perform To perform                                                                                                    activity at the same Activity         Level as before                                                                                                                       Injury or problem  Activity   Lifting laundry and groceries/housework                           initial:  3/10  11/14/23: 5/10 01/16/24 5-6/10  01/30/24: 6/10  2.    Yard work- up and down with squatting          Initial: 2/10  11/14/23: 3/10 01/16/24: 4/10  01/30/24: 4/10     COGNITION: Overall cognitive status: Within functional limits for tasks assessed  SENSATION: WFL  POSTURE: rounded shoulders and forward head  PALPATION: Diffuse palpable tenderness over bil neck, upper traps, thoracic, lumbar and gluteals with trigger points.    CERVICAL ROM:   Stiff at end range and pain on Rt with Lt motions.  All WFLs  UPPER EXTREMITY ROM: UE A/ROM is limited by 20% into flexion and abduction with pain at end range.  Hip flexibility is limited by 25% in all directions with pain in all directions.  UPPER EXTREMITY MMT: UE: 4+/5, LE 4+/5 except hip flexors 4-/5  TODAY'S TREATMENT:  03/19/24:  Nustep level 5 for 10 min- PT monitored and discussed pt status Ball roll outs 3 ways x 5 each  Leg press: seat 5, 60# 2x10 bil, 40# single leg 2x10 Row: 25# 2x10 March with alternating shoulder press: 5# 2x10- issued for HEP Sit to stand: with 5# kettlebell 2x10 with press out Famer's carry: 10# kettlebell around building  1 lap each hand Seated hamstring and figure 4 stretch 2x20 seconds bil.     03/12/24:  Nustep level 5 for 10 min- PT monitored and discussed pt status Ball roll outs 3 ways x 5 each    Trigger Point Dry Needling  Subsequent Treatment: Instructions provided previously at initial dry needling treatment.   Patient Verbal Consent Given: Yes Education Handout Provided: Previously Provided Muscles Treated: bil cervical paraspinals, upper traps, suboccipitals, rhomboids Electrical Stimulation Performed: No Treatment Response/Outcome: Utilized skilled palpation to identify bony landmarks and trigger points.  Able to illicit twitch response and muscle elongation.  Soft tissue mobilization to muscles needled following DN to further promote tissue elongation and decreased pain.        02/13/24:  Nustep level 5 for 10 min- PT monitored and discussed pt status Ball roll outs 3 ways x 5 each  Leg press: seat 5, 60# 2x10 bil, 40# single leg 2x10 Row: 25# 2x10 March with alternating shoulder press: 5# 2x10 Sit to stand: with 5# kettlebell 2x10 with press out Famer's carry: 10# kettlebell around building  1 lap each hand Seated hamstring and figure 4 stretch 2x20 seconds bil.    HOME EXERCISE PROGRAM:  Access Code: [email or web address]- previously provided  Access Code: [email or web address] URL: [email or web address] Date: 09/19/2023 Prepared by: Burnard  Exercises - Seated Cervical Flexion AROM  - 3 x daily - 7 x weekly - 1 sets - 3 reps - 20 hold - Seated Cervical Sidebending AROM  - 3 x daily - 7 x weekly - 1 sets - 3 reps - 20 hold - Seated Cervical Rotation AROM  - 3 x daily - 7 x weekly - 1 sets - 3 reps - 20 hold - Seated Correct Posture  - 1 x daily - 7 x weekly - 3 sets - 10 reps - Seated Diaphragmatic Breathing  - 5 x daily - 7 x weekly - 1 sets - 10 reps - Sidelying Open Book Thoracic Rotation with Knee on Foam Roll  - 2 x daily - 7 x weekly - 3 sets - 10 reps - Seated Transversus Abdominis Bracing  - 5 x daily - 7 x weekly - 1 sets - 10 reps - 5 hold - Seated Hamstring Stretch  - 3 x daily - 7 x weekly  - 1 sets - 3 reps - 20 hold ASSESSMENT:  CLINICAL IMPRESSION:   Pt had good response to dry needling last session with reduction in pain.  Today, she presents with widespread stiffness and responded well to exercise for muscle pumping. PT monitored throughout session for pain, fatigue and form.   Patient will benefit from skilled PT to address the below impairments and improve overall function.    OBJECTIVE IMPAIRMENTS decreased activity tolerance, decreased endurance, difficulty walking, decreased strength, increased muscle spasms, impaired flexibility, improper body mechanics, postural dysfunction, and pain.   ACTIVITY LIMITATIONS carrying, lifting, sitting, standing, reach over head, hygiene/grooming, and locomotion level  PARTICIPATION LIMITATIONS: meal prep, cleaning, laundry, driving, community activity, and yard work  PERSONAL FACTORS Past/current experiences, Time since onset of injury/illness/exacerbation, and 3+ comorbidities: fibromyalgia depression, anxiety,  are also affecting patient's functional outcome.   REHAB POTENTIAL: Good  CLINICAL DECISION MAKING: Evolving/moderate complexity  EVALUATION COMPLEXITY: Moderate   GOALS: Goals reviewed with patient? Yes  SHORT TERM GOALS: Target date: 10/17/2023    Return to regular performance of HEP 4-5x/week to improve mobility  Baseline: 3-5x/wk (met) Goal status: MET  2.  Rate housework > or = to 4/10 on patient specific functional scale (PSFS) Baseline: 5/10 Goal status: MET  3.  Verbalize and demonstrate abdominal massage daily to reduce GI pain Baseline:  Goal status: Met 8/11    LONG TERM GOALS: Target date: 04/23/2024   Be independent in advanced HEP Baseline:  Goal status:  Ongoing  2.  Rate squatting for yard work > or = to 4/10 on PSFS Baseline: 4/10 (01/30/24) Goal status:MET  3.  Report > or = to 70% reduction in neck and thoracic pain with daily tasks  Baseline: 55% (01/30/24) Goal status: in  progress   4.  Walk for exercise and perform regular exercises 3-5x/wk for exercise to improve endurance  Baseline: at least 3x/wk (01/16/24) Goal status: in progress   5.  Rate housework > or = to 7/10 on PSFS  Baseline: 6/10  (01/30/24) Goal status: revised  PLAN: PT FREQUENCY: 1-2x/week  PT DURATION: 12 weeks  PLANNED INTERVENTIONS: 97164- PT Re-evaluation, 97110-Therapeutic exercises, 97530- Therapeutic activity, 97112- Neuromuscular re-education, 97535- Self Care, [id number]- Manual therapy, [phone]- Gait training, [phone]- Aquatic Therapy, [phone]- Electrical stimulation (unattended), [phone]- Electrical stimulation (manual), [id number]- Traction (mechanical), 20560 (1-2 muscles), 20561 (3+ muscles)- Dry Needling, Patient/Family education, Balance training, Taping, Joint mobilization, Spinal manipulation, Spinal mobilization, Cryotherapy, Moist heat, Therapeutic exercises, Therapeutic activity, Neuromuscular re-education, and Self Care  PLAN FOR NEXT SESSION:: every other week to manage pain and advance exercises   Burnard Joy, PT 03/19/24 11:45 AM  "

## 2024-03-25 ENCOUNTER — Ambulatory Visit: Admitting: Psychology

## 2024-03-25 DIAGNOSIS — F4310 Post-traumatic stress disorder, unspecified: Secondary | ICD-10-CM | POA: Diagnosis not present

## 2024-03-25 NOTE — Progress Notes (Signed)
 " Danielle Bruce  Patient ID: Danielle Bruce, MRN: [id number],    Date:03/25/2024  Time Spent: 58 minutes Time In: 3:02 Time out:4:00  Treatment Type: Individual Therapy  Reported Symptoms: flashbacks, responding to triggers, depression, anxiety, being busy all of the time, dissociation  Mental Status Exam: Appearance:  Casual     Behavior: Appropriate  Motor: normal  Speech/Language:  Clear and Coherent  Affect: Blunt  Mood: pleasant  Thought process: normal  Thought content:   WNL  Sensory/Perceptual disturbances:   WNL  Orientation: oriented to person, place, time/date, and situation  Attention: Good  Concentration: Good  Memory: WNL  Fund of knowledge:  Good  Insight:   Good  Judgment:  Fair  Impulse Control: Good   Risk Assessment: Danger to Self:  No Self-injurious Behavior: No Danger to Others: No Duty to Warn:no Physical Aggression / Violence:No  Access to Firearms a concern: No  Gang Involvement:No   Subjective: The patient attended a face-to-face individual therapy via video visit today.  The patient gave verbal consent for the session to be on caregility.  The patient is aware of the limitations of telehealth.  The patient was in her home alone and the therapist was in her home office.  We had technical difficulties during part of the session and we ended up having to log out and log back in and ended up finishing on the phone for a few minutes.  We did the session virtually today because of the weather.  We talked today about the situation with her daughter and she is having difficulties because her daughter is not actually talking to her right now.  We processed what happened and it seems that her daughter must of gotten her feelings hurt because her mother said something about her needing medications and I think that the daughter felt that she needed medication to but did not want her mother to recognize that she did  need it.  We processed how to move forward and how to potentially handle the situation with her daughter.  So far she has not been too distraught about it and has been seeming to handle it pretty well that she has not been communicated with.  I recommended that she send a letter to her daughter stating that she is still available to have conversation and to try to work through things.  I do believe that this problem that she is experiencing with her daughter is primary likely related to her daughter's issues with how she is handling things.  The patient is really doing very well as far as her lack of reactivity and ability to respond.  We talked about a few more things that we may look at doing EMDR on, but we are almost finished with EMDR.  Interventions: Cognitive Behavioral Therapy, Mindfulness Meditation, Eye Movement Desensitization and Reprocessing (EMDR), Insight-Oriented, and Interpersonal, psychoeducation  Diagnosis:PTSD (post-traumatic stress disorder)  Plan: Plan of Care: Client Abilities/Strengths  Insightful, motivated, Supportive family Client Treatment Preferences  Outpatient Individual therapy/EMDR  Client Statement of Needs  I think I have PTSD and I feel like I need another kind of therapy than talk therapy Treatment Level  Outpatient Individual therapy  Symptoms  Demonstrates an exaggerated startle response.:  (Status: maintained). Depressed  or irritable mood.: (Status:improved). Describes a reliving of the event,  particularly through dissociative flashbacks.: (Status: maintained). Displays a  significant decline in interest and engagement in activities.:  (Status: improved). Displays significant psychological and/or physiological  distress resulting from internal and external  clues that are reminiscent of the traumatic event.: (Status: maintained).  Experiences disturbances in sleep.:  (Status: improved). Experiences disturbing  and persistent thoughts, images, and/or  perceptions of the traumatic event.:  (Status: improved). Experiences frequent nightmares.: (Status: improved).  Feelings of hopelessness, worthlessness, or inappropriate guilt.: No Description Entered (Status:  improved). Has been exposed to a traumatic event involving actual or perceived threat of death or  serious injury.:  (Status: maintained). Impairment in social, occupational, or  other areas of functioning.:(Status:improved). Intentionally avoids activities,  places, people, or objects (e.g., up-armored vehicles) that evoke memories of the event.: (Status: maintained). Intentionally avoids thoughts, feelings, or discussions related  to the traumatic event.: (Status: maintained). Reports difficulty concentrating as  well as feelings of guilt (Status:maintained). Reports response of intense fear,  helplessness, or horror to the traumatic event.:  (Status: maintained).  Problems Addressed  Unipolar Depression, Posttraumatic Stress Disorder (PTSD), Posttraumatic Stress Disorder (PTSD),   Posttraumatic Stress Disorder (PTSD), Posttraumatic Stress Disorder (PTSD)  Goals 1. Develop healthy thinking patterns and beliefs about self, others, and the world that lead to the alleviation and help prevent the relapse of  depression. Objective Identify and replace thoughts and beliefs that support depression. Target Date: 2025/01/03 Frequency: Weekly Progress: 80 Modality: individual Related Interventions 1. Explore and restructure underlying assumptions and beliefs reflected in biased self-talk that  may put the client at risk for relapse or recurrence. 2. Conduct Cognitive-Behavioral Therapy (see Cognitive Behavior Therapy by Almarie; Overcoming Depression by Marine dunker al.), beginning with helping the client learn the connection among  cognition, depressive feelings, and actions. 2. Eliminate or reduce the negative impact trauma related symptoms have  on social, occupational, and family  functioning. Objective Learn and implement personal skills to manage challenging situations related to trauma. Target Date: 2025/01/03 Frequency: Weekly Progress: 80 Modality: individual 3. No longer avoids persons, places, activities, and objects that are  reminiscent of the traumatic event. Objective Participate in Eye Movement Desensitization and Reprocessing (EMDR) to reduce emotional distress  related to traumatic thoughts, feelings, and images. Target Date: 2025/01/03 Frequency: Weekly Progress: 70 Modality: individual   Related Interventions 1. Utilize Eye Movement Desensitization and Reprocessing (EMDR) to reduce the client's  emotional reactivity to the traumatic event and reduce PTSD symptoms. Objective Learn and implement guided self-dialogue to manage thoughts, feelings, and urges brought on by  encounters with trauma-related situations. Target Date: 2026-11/07 Frequency: Weekly Progress: 70 Modality: individual Related Interventions 1. Teach the client a guided self-dialogue procedure in which he/she learns to recognize  maladaptive self-talk, challenges its biases, copes with engendered feelings, overcomes  avoidance, and reinforces his/her accomplishments; review and reinforce progress, problemsolve obstacles. 4. No longer experiences intrusive event recollections, avoidance of event  reminders, intense arousal, or disinterest in activities or  relationships. 5. Thinks about or openly discusses the traumatic event with others  without experiencing psychological or physiological distress. Diagnosis Axis  none F43.10 (Posttraumatic stress disorder) - Open -signifier: n/a posttraumatic Stress  Disorder  Conditions For Discharge Achievement of treatment goals and objectives   Danielle Ringel G Liandra Mendia,  LCSW                                                                                                     "

## 2024-04-01 ENCOUNTER — Ambulatory Visit: Admitting: Psychology

## 2024-04-01 DIAGNOSIS — F411 Generalized anxiety disorder: Secondary | ICD-10-CM

## 2024-04-01 DIAGNOSIS — F422 Mixed obsessional thoughts and acts: Secondary | ICD-10-CM | POA: Diagnosis not present

## 2024-04-01 DIAGNOSIS — F4310 Post-traumatic stress disorder, unspecified: Secondary | ICD-10-CM

## 2024-04-01 NOTE — Progress Notes (Signed)
 " [facility] Counselor/Therapist Progress Note  Patient ID: Danielle Bruce, MRN: [id number],    Date:04/01/2024  Time Spent: 57 minutes Time In: 3:06 Time out:4:03  Treatment Type: Individual Therapy  Reported Symptoms: flashbacks, responding to triggers, depression, anxiety, being busy all of the time, dissociation  Mental Status Exam: Appearance:  Casual     Behavior: Appropriate  Motor: normal  Speech/Language:  Clear and Coherent  Affect: Blunt  Mood: pleasant  Thought process: normal  Thought content:   WNL  Sensory/Perceptual disturbances:   WNL  Orientation: oriented to person, place, time/date, and situation  Attention: Good  Concentration: Good  Memory: WNL  Fund of knowledge:  Good  Insight:   Good  Judgment:  Fair  Impulse Control: Good   Risk Assessment: Danger to Self:  No Self-injurious Behavior: No Danger to Others: No Duty to Warn:no Physical Aggression / Violence:No  Access to Firearms a concern: No  Gang Involvement:No   Subjective: The patient attended a face-to-face individual therapy via video visit today.  The patient gave verbal consent for the session to be on caregility.  The patient is aware of the limitations of telehealth.  The patient was in her home alone and the therapist was in her home office.  The patient presents as pleasant and cooperative but more anxious today.  She reports that she saw some text between her mother and her daughter and her daughter is very angry with her and basically use words to describe her as cruel, manipulative, and not in touch with her daughter at all.  The patient did not react to this but she is struggling a little bit with wondering if maybe she does not see herself like her daughter sees her and that maybe there is something more wrong with her than she thinks.  I validated that she has done some really good work and that I feel like this mostly is her daughters issues and her daughter is not used to  her being healthy enough to set limits and boundaries and that her daughter is trying to manipulate her into coming and rescuing her.  I do not believe that any of those were described the patient and I explained that to her.  She does seem to be getting a little more antsy and I will talk to her during her next session about my concerns that she is questioning herself just because of what her daughter is saying.  I do believe her daughter is overwhelmed with her 4 children and her pending separation and divorce and that she is looking to manipulate her mom into coming and staying by making her feel bad because she is taking care of herself. Interventions: Cognitive Behavioral Therapy, Mindfulness Meditation, Eye Movement Desensitization and Reprocessing (EMDR), Insight-Oriented, and Interpersonal, psychoeducation  Diagnosis:PTSD (post-traumatic stress disorder)  Generalized anxiety disorder  Mixed obsessional thoughts and acts  Plan: Plan of Care: Client Abilities/Strengths  Insightful, motivated, Supportive family Client Treatment Preferences  Outpatient Individual therapy/EMDR  Client Statement of Needs  I think I have PTSD and I feel like I need another kind of therapy than talk therapy Treatment Level  Outpatient Individual therapy  Symptoms  Demonstrates an exaggerated startle response.:  (Status: maintained). Depressed  or irritable mood.: (Status:improved). Describes a reliving of the event,  particularly through dissociative flashbacks.: (Status: maintained). Displays a  significant decline in interest and engagement in activities.:  (Status: improved). Displays significant psychological and/or physiological distress resulting from internal and external  clues that are reminiscent of the traumatic event.: (Status: maintained).  Experiences disturbances in sleep.:  (Status: improved). Experiences disturbing  and persistent thoughts, images, and/or perceptions of the traumatic  event.:  (Status: improved). Experiences frequent nightmares.: (Status: improved).  Feelings of hopelessness, worthlessness, or inappropriate guilt.: No Description Entered (Status:  improved). Has been exposed to a traumatic event involving actual or perceived threat of death or  serious injury.:  (Status: maintained). Impairment in social, occupational, or  other areas of functioning.:(Status:improved). Intentionally avoids activities,  places, people, or objects (e.g., up-armored vehicles) that evoke memories of the event.: (Status: maintained). Intentionally avoids thoughts, feelings, or discussions related  to the traumatic event.: (Status: maintained). Reports difficulty concentrating as  well as feelings of guilt (Status:maintained). Reports response of intense fear,  helplessness, or horror to the traumatic event.:  (Status: maintained).  Problems Addressed  Unipolar Depression, Posttraumatic Stress Disorder (PTSD), Posttraumatic Stress Disorder (PTSD),   Posttraumatic Stress Disorder (PTSD), Posttraumatic Stress Disorder (PTSD)  Goals 1. Develop healthy thinking patterns and beliefs about self, others, and the world that lead to the alleviation and help prevent the relapse of  depression. Objective Identify and replace thoughts and beliefs that support depression. Target Date: 2025/01/03 Frequency: Weekly Progress: 80 Modality: individual Related Interventions 1. Explore and restructure underlying assumptions and beliefs reflected in biased self-talk that  may put the client at risk for relapse or recurrence. 2. Conduct Cognitive-Behavioral Therapy (see Cognitive Behavior Therapy by Almarie; Overcoming Depression by Marine dunker al.), beginning with helping the client learn the connection among  cognition, depressive feelings, and actions. 2. Eliminate or reduce the negative impact trauma related symptoms have  on social, occupational, and family functioning. Objective Learn and  implement personal skills to manage challenging situations related to trauma. Target Date: 2025/01/03 Frequency: Weekly Progress: 80 Modality: individual 3. No longer avoids persons, places, activities, and objects that are  reminiscent of the traumatic event. Objective Participate in Eye Movement Desensitization and Reprocessing (EMDR) to reduce emotional distress  related to traumatic thoughts, feelings, and images. Target Date: 2025/01/03 Frequency: Weekly Progress: 70 Modality: individual   Related Interventions 1. Utilize Eye Movement Desensitization and Reprocessing (EMDR) to reduce the client's  emotional reactivity to the traumatic event and reduce PTSD symptoms. Objective Learn and implement guided self-dialogue to manage thoughts, feelings, and urges brought on by  encounters with trauma-related situations. Target Date: 2026-11/07 Frequency: Weekly Progress: 70 Modality: individual Related Interventions 1. Teach the client a guided self-dialogue procedure in which he/she learns to recognize  maladaptive self-talk, challenges its biases, copes with engendered feelings, overcomes  avoidance, and reinforces his/her accomplishments; review and reinforce progress, problemsolve obstacles. 4. No longer experiences intrusive event recollections, avoidance of event  reminders, intense arousal, or disinterest in activities or  relationships. 5. Thinks about or openly discusses the traumatic event with others  without experiencing psychological or physiological distress. Diagnosis Axis  none F43.10 (Posttraumatic stress disorder) - Open -signifier: n/a posttraumatic Stress  Disorder  Conditions For Discharge Achievement of treatment goals and objectives   Williard Keller G Sula Fetterly, LCSW                                                                                                     "

## 2024-04-02 ENCOUNTER — Ambulatory Visit: Attending: Family Medicine

## 2024-04-02 DIAGNOSIS — R252 Cramp and spasm: Secondary | ICD-10-CM

## 2024-04-02 DIAGNOSIS — R293 Abnormal posture: Secondary | ICD-10-CM

## 2024-04-02 DIAGNOSIS — M6281 Muscle weakness (generalized): Secondary | ICD-10-CM

## 2024-04-02 DIAGNOSIS — M353 Polymyalgia rheumatica: Secondary | ICD-10-CM

## 2024-04-02 DIAGNOSIS — M542 Cervicalgia: Secondary | ICD-10-CM

## 2024-04-02 NOTE — Therapy (Signed)
 " OUTPATIENT PHYSICAL THERAPY TREATMENT     Patient Name: Danielle Bruce MRN: [id number] DOB:[birth year], 64 y.o., female Today's Date: 04/02/2024      PT End of Session - 04/02/24 1108     Visit Number 24    Date for Recertification  04/23/24    Authorization Type Medicare-KX needed (3/15)    Progress Note Due on Visit 28    PT Start Time 1100    PT Stop Time 1140    PT Time Calculation (min) 40 min    Activity Tolerance Patient tolerated treatment well    Behavior During Therapy [facility] for tasks assessed/performed                                     Past Medical History:  Diagnosis Date   Abdominal pain, unspecified site 10/18/2012   Anemia    Anxiety    Arthritis    osteoarthritis   ASCUS (atypical squamous cells of undetermined significance) on Pap smear 05/06/2005   NEG HIGH RISK HPV--C&B BIOPSY BENIGN 12/2005   Asthma    Basal cell carcinoma    Bipolar 2 disorder (HCC)    Cancer (HCC)    skin cancer - basal cell   Colon polyps    Complication of anesthesia    anxious afterwards, will get headaches    Constipation    Depression    Fibromyalgia 10/2013   GERD (gastroesophageal reflux disease)    Hearing loss on left    Heart murmur    never had any problems   Hemorrhoids    High cholesterol    High risk HPV infection 08/2011   cytology negative   IBS (irritable bowel syndrome)    Insomnia    Lymphocytic colitis    Lymphoma (HCC)    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   Bone marrow biopsy showes 8% plasma cells IgA Lambda   Migraines    Nausea alone 10/18/2012   Osteoarthritis    Osteopenia    Peripheral neuropathy    PTSD (post-traumatic stress disorder)    Past Surgical History:  Procedure Laterality Date   BONE MARROW BIOPSY Left 12/18/2013   Plasma cell dyscrasia 8% population of plasma cells   BREAST BIOPSY Right    benign stereo   CESAREAN SECTION  13,11,06   CHOLECYSTECTOMY N/A 10/16/2012    Procedure: LAPAROSCOPIC CHOLECYSTECTOMY;  Surgeon: Debby LABOR. Cornett, MD;  Location: [facility];  Service: General;  Laterality: N/A;   COLONOSCOPY     numerous times   DILATION AND CURETTAGE OF UTERUS     ESOPHAGOGASTRODUODENOSCOPY     HEMORRHOID SURGERY  1993   x3   IUD REMOVAL  02/2015   Mirena   LAPAROSCOPIC LYSIS OF ADHESIONS N/A 10/16/2012   Procedure: LAPAROSCOPIC LYSIS OF ADHESIONS;  Surgeon: Debby A. Cornett, MD;  Location: [facility];  Service: General;  Laterality: N/A;   LAPAROSCOPY N/A 10/16/2012   Procedure: LAPAROSCOPY DIAGNOSTIC;  Surgeon: Debby LABOR. Cornett, MD;  Location: [facility];  Service: General;  Laterality: N/A;   PELVIC LAPAROSCOPY     RADIOLOGY WITH ANESTHESIA N/A 12/16/2015   Procedure: MRI OF BRAIN WITH AND WITHOUT CONTRAST;  Surgeon: Medication Radiologist, MD;  Location: [facility];  Service: Radiology;  Laterality: N/A;   SHOULDER SURGERY  2007/2008   SPINE SURGERY  2010   cervical   Patient Active Problem List   Diagnosis Date  Noted   Sensorineural hearing loss, bilateral 11/19/2023   GAD (generalized anxiety disorder) 12/26/2017   OCD (obsessive compulsive disorder) 12/26/2017   PTSD (post-traumatic stress disorder) 12/26/2017   DDD (degenerative disc disease), cervical 07/14/2016   Primary osteoarthritis of both feet 07/14/2016   Primary osteoarthritis of both hands 07/14/2016   Other fatigue 07/14/2016   History of IBS 07/14/2016   Osteopenia of multiple sites 07/14/2016   Fibromyalgia 01/13/2016   MGUS (monoclonal gammopathy of unknown significance) 12/23/2013   Chronic cholecystitis without calculus 10/18/2012   Abdominal pain 10/18/2012   Nausea alone 10/18/2012   Constipation 10/18/2012   Depression 10/18/2012   Anxiety    ASCUS (atypical squamous cells of undetermined significance) on Pap smear    IUD    Hemorrhoids 11/29/2010   Abdominal pain, left upper quadrant 11/29/2010    PCP:  Joen Gentry, MD  REFERRING PROVIDER: Joen Gentry,  MD  REFERRING DIAG: fibromyalgia  THERAPY DIAG:  Abnormal posture  Polymyalgia rheumatica  Cramp and spasm  Muscle weakness (generalized)  Cervicalgia  Rationale for Evaluation and Treatment Rehabilitation  ONSET DATE: chronic pain (fibromyalgia), bowel flare up 1.5 months ago  SUBJECTIVE:                                                                                                                                                                                                         SUBJECTIVE STATEMENT:  I'm feeling good today.  My hands have been bothering me the most.  I've been exercising regularly at work.      PERTINENT HISTORY:  Anxiety, bipolar disorder, depression, fibromyalgia, IBS, migraines, osteopenia, PTSD  PAIN: 04/02/24 Are you having pain: overall soreness Pain location: low back   Pain rating: 3/10 Pain description: irritating, annoying   Aggravating factors: doing too much, stress  Relieving factors: muscle relaxers, rest, stretching  PRECAUTIONS: Other: chronic pain syndrome and depression Other: slow progression with exercise due to chronic condition  WEIGHT BEARING RESTRICTIONS No  FALLS:  Has patient fallen in last 6 months? No  LIVING ENVIRONMENT: Lives with: lives with their family Lives in: House/apartment  OCCUPATION: on disability  PLOF: Independent with basic ADLs and Leisure: yardwork, walking  Pt cares for her 4 young grandchildren- difficulty with care including lifting and carrying  PATIENT GOALS be more active with less pain, exercise at the gym regularly, lift and carry grandchildren and laundry, improved ease with yardwork and housework, resume exercise  OBJECTIVE:   DIAGNOSTIC FINDINGS: none recent   PATIENT SURVEYS:  The Patient-Specific Functional Scale  Initial:  I am going to ask you to identify up to 3 important activities that you are unable to do or are having difficulty with as a result of this problem.   Today are there any activities that you are unable to do or having difficulty with because of this?  (Patient shown scale and patient rated each activity)  Follow up: When you first came in you had difficulty performing these activities.  Today do you still have difficulty?  Patient-Specific activity scoring scheme (Point to one number):  0 1 2 3 4 5 6 7 8 9  10 Unable                                                                                                          Able to perform To perform                                                                                                    activity at the same Activity         Level as before                                                                                                                       Injury or problem  Activity   Lifting laundry and groceries/housework                           initial:  3/10  11/14/23: 5/10 01/16/24 5-6/10  01/30/24: 6/10  2.    Yard work- up and down with squatting          Initial: 2/10  11/14/23: 3/10 01/16/24: 4/10  01/30/24: 4/10     COGNITION: Overall cognitive status: Within functional limits for tasks assessed  SENSATION: WFL  POSTURE: rounded shoulders and forward head  PALPATION: Diffuse palpable tenderness over bil neck, upper traps, thoracic, lumbar and gluteals with trigger points.    CERVICAL ROM:   Stiff at end range and pain on Rt with Lt motions.  All WFLs  UPPER EXTREMITY ROM: UE A/ROM is limited by 20% into flexion and abduction  with pain at end range.  Hip flexibility is limited by 25% in all directions with pain in all directions.  UPPER EXTREMITY MMT: UE: 4+/5, LE 4+/5 except hip flexors 4-/5  TODAY'S TREATMENT:  04/02/24:  Nustep level 5 for 10 min- PT monitored and discussed pt status Ball roll outs 3 ways x 5 each  Leg press: seat 5, 60# 2x10 bil, 40# single leg 2x10 Row: 25# 2x10 March with alternating shoulder press: 5# 2x10 Sit to stand: with 10# kettlebell 2x10 , staggered stance x10 each  Famer's carry: 10# kettlebell around building  1 lap each hand Seated hamstring and figure 4 stretch 2x20 seconds bil.    03/19/24:  Nustep level 5 for 10 min- PT monitored and discussed pt status Ball roll outs 3 ways x 5 each  Leg press: seat 5, 60# 2x10 bil, 40# single leg 2x10 Row: 25# 2x10 March with alternating shoulder press: 5# 2x10- issued for HEP Sit to stand: with 5# kettlebell 2x10 with press out Famer's carry: 10# kettlebell around building  1 lap each hand Seated hamstring and figure 4 stretch 2x20 seconds bil.     03/12/24:  Nustep level 5 for 10 min- PT monitored and discussed pt status Ball roll outs 3 ways x 5 each    Trigger Point Dry Needling  Subsequent Treatment: Instructions provided previously at initial dry needling treatment.   Patient Verbal Consent Given: Yes Education Handout Provided: Previously Provided Muscles Treated: bil cervical paraspinals, upper traps, suboccipitals, rhomboids Electrical Stimulation Performed: No Treatment Response/Outcome: Utilized skilled palpation to identify bony landmarks and trigger points.  Able to illicit twitch response and muscle elongation.  Soft tissue mobilization to muscles needled following DN to further promote tissue elongation and decreased pain.        HOME EXERCISE PROGRAM:  Access Code: [email or web address]- previously provided  Access Code: [email or web address] URL: [email or web address] Date: 09/19/2023 Prepared by: Burnard  Exercises - Seated Cervical Flexion AROM  - 3 x daily - 7 x weekly - 1 sets - 3 reps - 20 hold - Seated Cervical Sidebending AROM  - 3 x daily - 7 x weekly - 1 sets - 3 reps - 20 hold - Seated Cervical Rotation AROM  - 3 x daily - 7 x weekly - 1 sets - 3 reps - 20 hold - Seated Correct Posture  - 1 x daily - 7 x weekly - 3 sets - 10 reps - Seated Diaphragmatic Breathing  - 5 x daily - 7 x weekly - 1 sets - 10 reps - Sidelying Open Book Thoracic Rotation with Knee on Foam Roll  - 2 x daily - 7 x weekly - 3 sets - 10 reps - Seated Transversus Abdominis Bracing  - 5 x daily - 7 x weekly - 1 sets - 10 reps - 5 hold -  Seated Hamstring Stretch  - 3 x daily - 7 x weekly - 1 sets - 3 reps - 20 hold ASSESSMENT:  CLINICAL IMPRESSION:   Pt arrived today with reduced pain overall.  She has been more active lately and not able to leave her house much due to weather.  Today, she presents with lumbar stiffness and responded well to exercise for muscle pumping. PT monitored throughout session for pain, fatigue and form.   Patient will benefit from skilled PT to address the below impairments and improve overall function.    OBJECTIVE IMPAIRMENTS decreased activity tolerance, decreased endurance, difficulty walking, decreased strength, increased muscle spasms, impaired flexibility, improper body mechanics, postural dysfunction, and pain.   ACTIVITY LIMITATIONS carrying, lifting, sitting, standing, reach over head, hygiene/grooming, and locomotion level  PARTICIPATION LIMITATIONS: meal prep, cleaning, laundry, driving, community activity, and yard work  PERSONAL FACTORS Past/current experiences, Time since onset of injury/illness/exacerbation, and 3+ comorbidities: fibromyalgia depression, anxiety,  are also affecting patient's functional outcome.   REHAB POTENTIAL: Good  CLINICAL DECISION MAKING: Evolving/moderate complexity  EVALUATION COMPLEXITY: Moderate   GOALS: Goals reviewed with patient? Yes  SHORT TERM GOALS: Target date: 10/17/2023    Return to regular performance of HEP 4-5x/week to improve mobility  Baseline: 3-5x/wk (met) Goal status: MET  2.  Rate housework > or = to 4/10 on patient specific functional scale (PSFS) Baseline: 5/10 Goal status: MET  3.  Verbalize and demonstrate abdominal massage daily to reduce  GI pain Baseline:  Goal status: Met 8/11    LONG TERM GOALS: Target date: 04/23/2024   Be independent in advanced HEP Baseline:  Goal status:  Ongoing  2.  Rate squatting for yard work > or = to 4/10 on PSFS Baseline: 4/10 (01/30/24) Goal status:MET  3.  Report > or = to 70%  reduction in neck and thoracic pain with daily tasks  Baseline: 55% (01/30/24) Goal status: in progress   4.  Walk for exercise and perform regular exercises 3-5x/wk for exercise to improve endurance  Baseline: at least 3x/wk (01/16/24) Goal status: in progress   5.  Rate housework > or = to 7/10 on PSFS  Baseline: 6/10  (01/30/24) Goal status: revised     PLAN: PT FREQUENCY: 1-2x/week  PT DURATION: 12 weeks  PLANNED INTERVENTIONS: 97164- PT Re-evaluation, 97110-Therapeutic exercises, 97530- Therapeutic activity, 97112- Neuromuscular re-education, 97535- Self Care, [id number]- Manual therapy, [phone]- Gait training, [phone]- Aquatic Therapy, [phone]- Electrical stimulation (unattended), [phone]- Electrical stimulation (manual), [id number]- Traction (mechanical), 20560 (1-2 muscles), 20561 (3+ muscles)- Dry Needling, Patient/Family education, Balance training, Taping, Joint mobilization, Spinal manipulation, Spinal mobilization, Cryotherapy, Moist heat, Therapeutic exercises, Therapeutic activity, Neuromuscular re-education, and Self Care  PLAN FOR NEXT SESSION:: every other week to manage pain and advance exercises. ERO next visit   Burnard Joy, PT 04/02/24 11:41 AM  "

## 2024-04-03 ENCOUNTER — Ambulatory Visit: Admitting: Physician Assistant

## 2024-04-08 ENCOUNTER — Ambulatory Visit: Admitting: Psychology

## 2024-04-15 ENCOUNTER — Ambulatory Visit: Admitting: Psychology

## 2024-04-16 ENCOUNTER — Ambulatory Visit

## 2024-04-16 ENCOUNTER — Ambulatory Visit: Admitting: Psychiatry

## 2024-04-22 ENCOUNTER — Ambulatory Visit: Admitting: Psychology

## 2024-04-29 ENCOUNTER — Ambulatory Visit: Admitting: Psychology

## 2024-05-06 ENCOUNTER — Ambulatory Visit: Admitting: Psychology

## 2024-05-13 ENCOUNTER — Ambulatory Visit: Admitting: Psychology

## 2024-05-20 ENCOUNTER — Ambulatory Visit: Admitting: Psychology

## 2024-05-27 ENCOUNTER — Ambulatory Visit: Admitting: Psychology

## 2024-05-29 ENCOUNTER — Ambulatory Visit: Admitting: Dermatology

## 2024-06-09 ENCOUNTER — Ambulatory Visit: Admitting: Allergy

## 2024-07-31 ENCOUNTER — Ambulatory Visit: Admitting: Dermatology
